# Patient Record
Sex: Female | Born: 1980 | Race: Black or African American | Hispanic: No | Marital: Single | Smoking: Former smoker
Health system: Southern US, Community
[De-identification: ages and names within clinical notes are randomized; demographics above are authoritative.]

## PROBLEM LIST (undated history)

## (undated) DIAGNOSIS — I214 Non-ST elevation (NSTEMI) myocardial infarction: Secondary | ICD-10-CM

## (undated) DIAGNOSIS — Z95820 Peripheral vascular angioplasty status with implants and grafts: Secondary | ICD-10-CM

## (undated) DIAGNOSIS — L03113 Cellulitis of right upper limb: Secondary | ICD-10-CM

## (undated) DIAGNOSIS — G4730 Sleep apnea, unspecified: Secondary | ICD-10-CM

## (undated) DIAGNOSIS — E785 Hyperlipidemia, unspecified: Secondary | ICD-10-CM

## (undated) DIAGNOSIS — N179 Acute kidney failure, unspecified: Secondary | ICD-10-CM

## (undated) DIAGNOSIS — M1990 Unspecified osteoarthritis, unspecified site: Secondary | ICD-10-CM

## (undated) DIAGNOSIS — I2510 Atherosclerotic heart disease of native coronary artery without angina pectoris: Secondary | ICD-10-CM

## (undated) DIAGNOSIS — F419 Anxiety disorder, unspecified: Secondary | ICD-10-CM

## (undated) DIAGNOSIS — I10 Essential (primary) hypertension: Secondary | ICD-10-CM

## (undated) DIAGNOSIS — F32A Depression, unspecified: Secondary | ICD-10-CM

## (undated) DIAGNOSIS — D649 Anemia, unspecified: Secondary | ICD-10-CM

## (undated) DIAGNOSIS — E669 Obesity, unspecified: Secondary | ICD-10-CM

## (undated) DIAGNOSIS — T8859XA Other complications of anesthesia, initial encounter: Secondary | ICD-10-CM

## (undated) HISTORY — DX: Atherosclerotic heart disease of native coronary artery without angina pectoris: I25.10

## (undated) HISTORY — DX: Hyperlipidemia, unspecified: E78.5

## (undated) HISTORY — PX: TONSILLECTOMY: SUR1361

## (undated) HISTORY — DX: Peripheral vascular angioplasty status with implants and grafts: Z95.820

## (undated) HISTORY — DX: Non-ST elevation (NSTEMI) myocardial infarction: I21.4

## (undated) HISTORY — PX: LEG SURGERY: SHX1003

---

## 2007-09-01 ENCOUNTER — Ambulatory Visit: Payer: Self-pay | Admitting: Family Medicine

## 2007-09-02 ENCOUNTER — Ambulatory Visit: Payer: Self-pay | Admitting: Obstetrics & Gynecology

## 2007-09-05 ENCOUNTER — Encounter: Admission: RE | Admit: 2007-09-05 | Discharge: 2007-09-05 | Payer: Self-pay | Admitting: Obstetrics & Gynecology

## 2007-09-05 ENCOUNTER — Ambulatory Visit: Payer: Self-pay | Admitting: *Deleted

## 2007-09-12 ENCOUNTER — Ambulatory Visit (HOSPITAL_COMMUNITY): Admission: RE | Admit: 2007-09-12 | Discharge: 2007-09-12 | Payer: Self-pay | Admitting: Family Medicine

## 2007-09-12 ENCOUNTER — Ambulatory Visit: Payer: Self-pay | Admitting: Obstetrics & Gynecology

## 2007-09-13 ENCOUNTER — Emergency Department (HOSPITAL_COMMUNITY): Admission: EM | Admit: 2007-09-13 | Discharge: 2007-09-14 | Payer: Self-pay | Admitting: Emergency Medicine

## 2007-09-15 ENCOUNTER — Ambulatory Visit (HOSPITAL_COMMUNITY): Admission: RE | Admit: 2007-09-15 | Discharge: 2007-09-15 | Payer: Self-pay | Admitting: Family Medicine

## 2007-09-19 ENCOUNTER — Ambulatory Visit: Payer: Self-pay | Admitting: Obstetrics & Gynecology

## 2007-09-26 ENCOUNTER — Ambulatory Visit: Payer: Self-pay | Admitting: Family Medicine

## 2007-09-26 ENCOUNTER — Ambulatory Visit (HOSPITAL_COMMUNITY): Admission: RE | Admit: 2007-09-26 | Discharge: 2007-09-26 | Payer: Self-pay | Admitting: Obstetrics & Gynecology

## 2007-10-10 ENCOUNTER — Ambulatory Visit: Payer: Self-pay | Admitting: Obstetrics & Gynecology

## 2007-10-12 ENCOUNTER — Encounter: Payer: Self-pay | Admitting: Obstetrics & Gynecology

## 2007-10-12 ENCOUNTER — Ambulatory Visit: Payer: Self-pay | Admitting: Obstetrics & Gynecology

## 2007-10-12 ENCOUNTER — Other Ambulatory Visit: Admission: RE | Admit: 2007-10-12 | Discharge: 2007-10-12 | Payer: Self-pay | Admitting: Obstetrics & Gynecology

## 2007-10-17 ENCOUNTER — Ambulatory Visit: Payer: Self-pay | Admitting: Family Medicine

## 2007-10-18 ENCOUNTER — Ambulatory Visit: Payer: Self-pay | Admitting: Obstetrics & Gynecology

## 2007-10-19 ENCOUNTER — Ambulatory Visit: Payer: Self-pay | Admitting: Obstetrics & Gynecology

## 2007-10-24 ENCOUNTER — Ambulatory Visit: Payer: Self-pay | Admitting: Obstetrics & Gynecology

## 2007-10-31 ENCOUNTER — Ambulatory Visit: Payer: Self-pay | Admitting: Obstetrics & Gynecology

## 2007-11-04 ENCOUNTER — Ambulatory Visit (HOSPITAL_COMMUNITY): Admission: RE | Admit: 2007-11-04 | Discharge: 2007-11-04 | Payer: Self-pay | Admitting: Obstetrics & Gynecology

## 2007-11-07 ENCOUNTER — Ambulatory Visit: Payer: Self-pay | Admitting: Obstetrics & Gynecology

## 2007-11-20 ENCOUNTER — Ambulatory Visit: Payer: Self-pay | Admitting: Obstetrics & Gynecology

## 2007-11-20 ENCOUNTER — Inpatient Hospital Stay (HOSPITAL_COMMUNITY): Admission: AD | Admit: 2007-11-20 | Discharge: 2007-11-20 | Payer: Self-pay | Admitting: Obstetrics & Gynecology

## 2007-11-21 ENCOUNTER — Ambulatory Visit: Payer: Self-pay | Admitting: Obstetrics & Gynecology

## 2007-12-02 ENCOUNTER — Ambulatory Visit (HOSPITAL_COMMUNITY): Admission: RE | Admit: 2007-12-02 | Discharge: 2007-12-02 | Payer: Self-pay | Admitting: Physician Assistant

## 2007-12-05 ENCOUNTER — Ambulatory Visit: Payer: Self-pay | Admitting: Obstetrics & Gynecology

## 2007-12-09 ENCOUNTER — Ambulatory Visit: Payer: Self-pay | Admitting: Obstetrics & Gynecology

## 2007-12-09 ENCOUNTER — Other Ambulatory Visit: Admission: RE | Admit: 2007-12-09 | Discharge: 2007-12-09 | Payer: Self-pay | Admitting: Obstetrics & Gynecology

## 2007-12-12 ENCOUNTER — Ambulatory Visit: Payer: Self-pay | Admitting: Obstetrics & Gynecology

## 2007-12-14 ENCOUNTER — Ambulatory Visit: Payer: Self-pay | Admitting: Gynecology

## 2007-12-14 ENCOUNTER — Ambulatory Visit (HOSPITAL_COMMUNITY): Admission: RE | Admit: 2007-12-14 | Discharge: 2007-12-14 | Payer: Self-pay | Admitting: Obstetrics & Gynecology

## 2007-12-26 ENCOUNTER — Ambulatory Visit (HOSPITAL_COMMUNITY): Admission: RE | Admit: 2007-12-26 | Discharge: 2007-12-26 | Payer: Self-pay | Admitting: Obstetrics & Gynecology

## 2007-12-26 ENCOUNTER — Ambulatory Visit: Payer: Self-pay | Admitting: Obstetrics & Gynecology

## 2007-12-29 ENCOUNTER — Ambulatory Visit: Payer: Self-pay | Admitting: Obstetrics & Gynecology

## 2007-12-30 ENCOUNTER — Ambulatory Visit (HOSPITAL_COMMUNITY): Admission: RE | Admit: 2007-12-30 | Discharge: 2007-12-30 | Payer: Self-pay | Admitting: Physician Assistant

## 2007-12-30 ENCOUNTER — Ambulatory Visit: Payer: Self-pay | Admitting: Gynecology

## 2008-01-05 ENCOUNTER — Ambulatory Visit: Payer: Self-pay | Admitting: Obstetrics & Gynecology

## 2008-01-05 ENCOUNTER — Ambulatory Visit (HOSPITAL_COMMUNITY): Admission: RE | Admit: 2008-01-05 | Discharge: 2008-01-05 | Payer: Self-pay | Admitting: Physician Assistant

## 2008-01-12 ENCOUNTER — Ambulatory Visit (HOSPITAL_COMMUNITY): Admission: RE | Admit: 2008-01-12 | Discharge: 2008-01-12 | Payer: Self-pay | Admitting: Family Medicine

## 2008-01-19 ENCOUNTER — Ambulatory Visit (HOSPITAL_COMMUNITY): Admission: RE | Admit: 2008-01-19 | Discharge: 2008-01-19 | Payer: Self-pay | Admitting: Physician Assistant

## 2008-01-19 ENCOUNTER — Ambulatory Visit: Payer: Self-pay | Admitting: Obstetrics & Gynecology

## 2008-01-20 ENCOUNTER — Ambulatory Visit: Payer: Self-pay | Admitting: Gynecology

## 2008-01-23 ENCOUNTER — Ambulatory Visit: Payer: Self-pay | Admitting: Obstetrics & Gynecology

## 2008-01-24 ENCOUNTER — Ambulatory Visit: Payer: Self-pay | Admitting: Obstetrics and Gynecology

## 2008-01-26 ENCOUNTER — Ambulatory Visit: Payer: Self-pay | Admitting: Obstetrics & Gynecology

## 2008-01-26 ENCOUNTER — Ambulatory Visit (HOSPITAL_COMMUNITY): Admission: RE | Admit: 2008-01-26 | Discharge: 2008-01-26 | Payer: Self-pay | Admitting: Physician Assistant

## 2008-01-30 ENCOUNTER — Ambulatory Visit: Payer: Self-pay | Admitting: Obstetrics & Gynecology

## 2008-02-02 ENCOUNTER — Ambulatory Visit (HOSPITAL_COMMUNITY): Admission: RE | Admit: 2008-02-02 | Discharge: 2008-02-02 | Payer: Self-pay | Admitting: Obstetrics & Gynecology

## 2008-02-06 ENCOUNTER — Ambulatory Visit: Payer: Self-pay | Admitting: Obstetrics & Gynecology

## 2008-02-07 ENCOUNTER — Ambulatory Visit: Payer: Self-pay | Admitting: Obstetrics & Gynecology

## 2008-02-09 ENCOUNTER — Ambulatory Visit: Payer: Self-pay | Admitting: Obstetrics and Gynecology

## 2008-02-09 ENCOUNTER — Inpatient Hospital Stay (HOSPITAL_COMMUNITY): Admission: AD | Admit: 2008-02-09 | Discharge: 2008-02-15 | Payer: Self-pay | Admitting: Obstetrics & Gynecology

## 2008-02-09 ENCOUNTER — Encounter: Payer: Self-pay | Admitting: *Deleted

## 2008-02-21 ENCOUNTER — Ambulatory Visit: Payer: Self-pay | Admitting: *Deleted

## 2008-02-21 ENCOUNTER — Inpatient Hospital Stay (HOSPITAL_COMMUNITY): Admission: AD | Admit: 2008-02-21 | Discharge: 2008-02-21 | Payer: Self-pay | Admitting: Obstetrics & Gynecology

## 2008-02-24 ENCOUNTER — Ambulatory Visit: Payer: Self-pay | Admitting: Gynecology

## 2010-03-05 ENCOUNTER — Emergency Department (HOSPITAL_COMMUNITY): Admission: EM | Admit: 2010-03-05 | Discharge: 2010-03-05 | Payer: Self-pay | Admitting: Emergency Medicine

## 2010-03-21 ENCOUNTER — Emergency Department (HOSPITAL_COMMUNITY): Admission: EM | Admit: 2010-03-21 | Discharge: 2010-03-21 | Payer: Self-pay | Admitting: Family Medicine

## 2010-05-23 ENCOUNTER — Encounter: Admission: RE | Admit: 2010-05-23 | Discharge: 2010-05-23 | Payer: Self-pay | Admitting: Family Medicine

## 2010-12-25 ENCOUNTER — Emergency Department (HOSPITAL_COMMUNITY)
Admission: EM | Admit: 2010-12-25 | Discharge: 2010-12-25 | Payer: Self-pay | Source: Home / Self Care | Admitting: Emergency Medicine

## 2011-01-11 ENCOUNTER — Encounter: Payer: Self-pay | Admitting: *Deleted

## 2011-03-11 LAB — WET PREP, GENITAL: Clue Cells Wet Prep HPF POC: NONE SEEN

## 2011-03-11 LAB — POCT URINALYSIS DIP (DEVICE)
Bilirubin Urine: NEGATIVE
Glucose, UA: >=1000 mg/dL — AB
Nitrite: NEGATIVE
pH: 6 (ref 5.0–8.0)

## 2011-03-11 LAB — POCT PREGNANCY, URINE: Preg Test, Ur: NEGATIVE

## 2011-03-11 LAB — GC/CHLAMYDIA PROBE AMP, GENITAL: GC Probe Amp, Genital: NEGATIVE

## 2011-03-11 LAB — URINE CULTURE

## 2011-03-16 LAB — GLUCOSE, CAPILLARY

## 2011-05-05 NOTE — Discharge Summary (Signed)
NAMEJENNALEE, GREAVES               ACCOUNT NO.:  [id number]   MEDICAL RECORD NO.:  [id number]            PATIENT TYPE:   LOCATION:                                 FACILITY:   PHYSICIAN:  Tanya S. Shawnie Pons, M.D.   DATE OF BIRTH:  [birth year]   DATE OF ADMISSION:  DATE OF DISCHARGE:                               DISCHARGE SUMMARY   ADDENDUM:  Ms. Muilenburg stayed one extra day secondary to her infant  having jaundice.  She was stable throughout her stay.  Her postprandial  blood sugars ranged from 109 to 140, fasting was 88.  She was stable.  Incision looked good.  JP drain had 40 cc out over the last 24 hours of  her hosptial stay.  Discharge insulin was switched to 70/30, 55 units in  the morning and 35 units in the evening.  Patient has arrangements to be  discharged to [facility] in [place].  She is to follow up  in MAU in 6 days for staple removal and drain removal.  She is to follow  sooner if she has any concerns about her incision.      Karlton Lemon, MD  Electronically Signed     ______________________________  Shelbie Proctor. Shawnie Pons, M.D.    NS/MEDQ  D:  02/15/2008  T:  02/15/2008  Job:  [id number]

## 2011-05-05 NOTE — Discharge Summary (Signed)
Lindsay [name], Lindsay [name]             ACCOUNT NO.:  [id number]   MEDICAL RECORD NO.:  [id number]          PATIENT TYPE:  INP   LOCATION:                                 FACILITY:   PHYSICIAN:  Tanya S. Shawnie Pons, M.D.   DATE OF BIRTH:  [birth year]   DATE OF ADMISSION:  02/09/2008  DATE OF DISCHARGE:  02/14/2008                               DISCHARGE SUMMARY   DISCHARGE DIAGNOSES:  1. Primary low-tranverse C-section for non reassuring fetal heart      tones and failure to progress with delivery of viable baby girl.  2. Chronic hypertension with superimposed preeclampsia.  3. Type 2 diabetes, insulin dependent during pregnancy.  4. Asthma.  5. History of herpes simplex virus.   DISCHARGE MEDICATIONS:  1. Percocet 5/325 mg 1-2 p.o. q.6h p.r.n. pain.  2. Motrin 600 mg 1 p.o. q.6h. p.r.n. abdominal cramping.  3. Aspirin 81 mg daily.  4. Colace 100 mg 1 p.o. b.i.d.  5. Prenatal vitamins 1 p.o. daily while breastfeeding.  6. Albuterol 90 mcg FDI 2 puffs q.4-6h. p.r.n. wheezing.  7. NPH 40 units in morning, 20 units at night.  8. Regular insulin 20 units b.i.d.   DISCHARGE LABS:  O+, antibody negative.  Hemoglobin 9.5, hematocrit  27.9, platelets 182.  Rubella immune.  HB surface antigen negative.  RPR  nonreactive.  HIV nonreactive.  Chlamydia and Gonorrhea negative.  GBS  positive.   HOSPITAL COURSE:  This is a 30 year old female G1 P1 who presented at 38  weeks 6 days for induction of labor secondary to chronic secondary with  superimposed preeclampsia and poorly controlled Class B diabetes.  Induction was converted to primary low-tranverse C-section secondary to  non reassuring fetal heart tones and failure to progress.  C-section  performed by Dr. Emelda Fear and Dr. Darrol Angel under epidural anesthesia with  delivery of 7 pounds 15 ounce female with Apgars of 5 and 9 at 1 minute  and 5 minutes, respectively, and blood gas pH of 7.2.  There was 1  nuchal cord which was reduced, and no  complications were noted.  Patient  desires to breastfeed and denies any contraception currently.  Patient  to be discharged to [facility] in [place], where she is  currently residing.   DISCHARGE INSTRUCTIONS:  Upon discharge, patient is tolerating  ambulation without dizziness, tolerating p.o., voiding and having bowel  movements.  Patient discharged in stable condition.  JP drain and  staples will remain in until visit in 1 week to MAU where they will be  removed.  Patient was taught prior to discharge on JP drain maintenance  and emptying.   FOLLOW UP:  Patient to follow up with [facility] upon  discharge and then call for appointment.  The patient also to return to  MAU for JP drain removal and staple removal in a week.      Eustaquio Boyden, MD      Shelbie Proctor. Shawnie Pons, M.D.  Electronically Signed    JG/MEDQ  D:  02/14/2008  T:  02/14/2008  Job:  [id number]

## 2011-05-05 NOTE — Op Note (Signed)
Lindsay [name], Lindsay [name]             ACCOUNT NO.:  [id number]   MEDICAL RECORD NO.:  [id number]          PATIENT TYPE:  INP   LOCATION:  9373                          FACILITY:  [facility]   PHYSICIAN:  Tilda Burrow, M.D. DATE OF BIRTH:  01-Sep-1981   DATE OF PROCEDURE:  02/11/2008  DATE OF DISCHARGE:                               OPERATIVE REPORT   PREOPERATIVE DIAGNOSES:  1. Pregnancy 39 weeks.  2. Failure to progress.  3. Nonreassuring fetal heart tones.  4. Chronic hypertension with superimposed preeclampsia.  5. Gestational diabetes, insulin-controlled.   POSTOPERATIVE DIAGNOSES:  1. Pregnancy 39 weeks.  2. Failure to progress.  3. Nonreassuring fetal heart tones.  4. Chronic hypertension with superimposed preeclampsia.  5. Gestational diabetes, insulin-controlled.   PROCEDURE:  Primary low transverse cervical cesarean section.   SURGEON:  Christin Bach, MD.   ASSISTANT:  Karlton Lemon, MD.   ANESTHESIA:  Epidural.   COMPLICATIONS:  None.   FINDINGS:  A 7 pound 15 ounce female, Apgars 5/9, blood gas pH 7.2,  normal female anatomy with a nuchal cord x1.   INDICATIONS:  Induction at 39 weeks for gestational diabetes, chronic  hypertension with preeclampsia based on protein criteria.   DETAILS OF PROCEDURE:  The patient was taken to the operating room,  prepped and draped for lower abdominal surgery, the epidural catheter  had been functioning excellently and was topped up, fetal heart rates  were confirmed and the scalp electrode removed.  A time-out performed  and abdomen had been taped upward to allow access.  A transverse lower  abdominal incision was made in the method of Pfannenstiel for  approximately 25 cm in length with sharp dissection through the  subcutaneous fatty tissue, identifying the fascia with a transverse  incision in the fascia.  The rectus muscles were split in the midline,  the peritoneal cavity entered bluntly and bladder flap developed  minimally  on the lower uterine segment.  A transverse uterine incision  was made and extended transversely with index finger traction and then  cephalad somewhat.  Bag of waters was intact.  Allis clamp was used to  grasp the bag of waters to rupture the membranes.  It was noted that  there was a loop of cord right where the Allis clamp had been placed.  The clamp was repositioned, membranes ruptured and fetal vertex rotated  into the incision and delivered with fundal pressure.  The cord was  loose and allowed the baby to be slipped through the loop of cord as the  baby was delivered.  Cord was clamped, cord blood gases obtained, pH 7.2  on arterial sample.  The cord blood sample was taken, placenta delivered  by Crede uterine massage.  IV antibiotics, 2 g Ancef, was administered.  The patient had relatively good uterine tone, responded to IV oxytocin.  The uterus was closed in a single layer running locking closure but  required several figure-of-eight sutures in the second layer to achieve  adequate hemostasis.  Bladder flap was reapproximated in the middle  portion of the incision.  Hemostasis was considered adequate.  The  abdomen was inspected, no clots visualized.  The peritoneum closed  anteriorly, the rectus muscles pulled together with a single suture of  #2-0 Chromic at the level of the pyramidalis muscle insertion, and then  the fascia closed using two segments of continuous running #0 Vicryl  sewn from side to middle on each side.  The subcu fatty tissues were  inspected, confirmed as hemostatic.  A flat JP drain was placed through  the depths of the subcutaneous fatty tissue and allowed to exit through  a separate stab incision on the left side and then the subcutaneous  fatty tissue was reapproximated using interrupted #2-0 plain times  several sites.  Staple closure of the skin completed the procedure.  Sponge and needle counts correct.  Patient went to recovery room in  stable  condition.      Tilda Burrow, M.D.  Electronically Signed     JVF/MEDQ  D:  02/11/2008  T:  02/12/2008  Job:  [id number]

## 2011-06-10 ENCOUNTER — Inpatient Hospital Stay (HOSPITAL_COMMUNITY)
Admission: EM | Admit: 2011-06-10 | Discharge: 2011-06-13 | DRG: 442 | Disposition: A | Discharge status: Home / Self Care | Payer: Medicaid Other | Attending: Internal Medicine | Admitting: Internal Medicine

## 2011-06-10 ENCOUNTER — Emergency Department (HOSPITAL_COMMUNITY): Payer: Medicaid Other

## 2011-06-10 DIAGNOSIS — Z794 Long term (current) use of insulin: Secondary | ICD-10-CM

## 2011-06-10 DIAGNOSIS — I951 Orthostatic hypotension: Secondary | ICD-10-CM | POA: Diagnosis present

## 2011-06-10 DIAGNOSIS — J45909 Unspecified asthma, uncomplicated: Secondary | ICD-10-CM | POA: Diagnosis present

## 2011-06-10 DIAGNOSIS — Z9119 Patient's noncompliance with other medical treatment and regimen: Secondary | ICD-10-CM

## 2011-06-10 DIAGNOSIS — E871 Hypo-osmolality and hyponatremia: Secondary | ICD-10-CM | POA: Diagnosis present

## 2011-06-10 DIAGNOSIS — Z91199 Patient's noncompliance with other medical treatment and regimen due to unspecified reason: Secondary | ICD-10-CM

## 2011-06-10 DIAGNOSIS — E669 Obesity, unspecified: Secondary | ICD-10-CM | POA: Diagnosis present

## 2011-06-10 DIAGNOSIS — E119 Type 2 diabetes mellitus without complications: Secondary | ICD-10-CM | POA: Diagnosis present

## 2011-06-10 DIAGNOSIS — B1910 Unspecified viral hepatitis B without hepatic coma: Principal | ICD-10-CM | POA: Diagnosis present

## 2011-06-10 DIAGNOSIS — I10 Essential (primary) hypertension: Secondary | ICD-10-CM | POA: Diagnosis present

## 2011-06-10 LAB — CBC
Hemoglobin: 12.1 g/dL (ref 12.0–15.0)
MCH: 26.1 pg (ref 26.0–34.0)
MCV: 79.3 fL (ref 78.0–100.0)
RBC: 4.63 MIL/uL (ref 3.87–5.11)

## 2011-06-10 LAB — COMPREHENSIVE METABOLIC PANEL
CO2: 21 mEq/L (ref 19–32)
Calcium: 9.6 mg/dL (ref 8.4–10.5)
Creatinine, Ser: <0.47 mg/dL — ABNORMAL LOW (ref 0.50–1.10)
Glucose, Bld: 449 mg/dL — ABNORMAL HIGH (ref 70–99)

## 2011-06-10 LAB — URINALYSIS, ROUTINE W REFLEX MICROSCOPIC
Nitrite: NEGATIVE
Specific Gravity, Urine: 1.034 — ABNORMAL HIGH (ref 1.005–1.030)
Urobilinogen, UA: 1 mg/dL (ref 0.0–1.0)

## 2011-06-10 LAB — URINE MICROSCOPIC-ADD ON

## 2011-06-10 LAB — DIFFERENTIAL
Eosinophils Absolute: 0.1 10*3/uL (ref 0.0–0.7)
Lymphs Abs: 2.4 10*3/uL (ref 0.7–4.0)
Monocytes Relative: 4 % (ref 3–12)
Neutro Abs: 2.6 10*3/uL (ref 1.7–7.7)
Neutrophils Relative %: 48 % (ref 43–77)

## 2011-06-11 ENCOUNTER — Emergency Department (HOSPITAL_COMMUNITY): Payer: Medicaid Other

## 2011-06-11 LAB — URINE DRUGS OF ABUSE SCREEN W ALC, ROUTINE (REF LAB)
Benzodiazepines.: NEGATIVE
Cocaine Metabolites: NEGATIVE
Methadone: NEGATIVE
Phencyclidine (PCP): NEGATIVE
Propoxyphene: NEGATIVE

## 2011-06-11 LAB — GLUCOSE, CAPILLARY
Glucose-Capillary: 323 mg/dL — ABNORMAL HIGH (ref 70–99)
Glucose-Capillary: 337 mg/dL — ABNORMAL HIGH (ref 70–99)
Glucose-Capillary: 313 mg/dL — ABNORMAL HIGH (ref 70–99)
Glucose-Capillary: 247 mg/dL — ABNORMAL HIGH (ref 70–99)
Glucose-Capillary: 133 mg/dL — ABNORMAL HIGH (ref 70–99)
Glucose-Capillary: 185 mg/dL — ABNORMAL HIGH (ref 70–99)
Glucose-Capillary: 206 mg/dL — ABNORMAL HIGH (ref 70–99)

## 2011-06-11 LAB — HEPATIC FUNCTION PANEL
ALT: 1491 U/L — ABNORMAL HIGH (ref 0–35)
AST: 1026 U/L — ABNORMAL HIGH (ref 0–37)
Alkaline Phosphatase: 343 U/L — ABNORMAL HIGH (ref 39–117)
Bilirubin, Direct: 4.4 mg/dL — ABNORMAL HIGH (ref 0.0–0.3)
Indirect Bilirubin: 2.1 mg/dL — ABNORMAL HIGH (ref 0.3–0.9)
Total Bilirubin: 6.5 mg/dL — ABNORMAL HIGH (ref 0.3–1.2)

## 2011-06-11 LAB — TSH: TSH: 1.568 u[IU]/mL (ref 0.350–4.500)

## 2011-06-11 LAB — PROTIME-INR: INR: 1.03 (ref 0.00–1.49)

## 2011-06-11 LAB — CK: Total CK: 26 U/L (ref 7–177)

## 2011-06-11 MED ORDER — IOHEXOL 300 MG/ML  SOLN
100.0000 mL | Freq: Once | INTRAMUSCULAR | Status: AC | PRN
  Administered 2011-06-11: 100 mL via INTRAVENOUS

## 2011-06-11 NOTE — H&P (Signed)
NAMEHALAH, Lindsay [name]             ACCOUNT NO.:  [id number]  MEDICAL RECORD NO.:  [id number]  LOCATION:  5151                         FACILITY:  [facility]  PHYSICIAN:  Marinda Elk, M.D.DATE OF BIRTH:  02-02-1981  DATE OF ADMISSION:  06/10/2011 DATE OF DISCHARGE:                             HISTORY & PHYSICAL   PRIMARY CARE DOCTOR:  [facility].  CHIEF COMPLAINT:  Nausea and vomiting, orthostasis, and dizzy upon standing.  HISTORY OF PRESENT ILLNESS:  This is a 30 year old with past medical history of diabetes who comes in for polyuria, nausea and vomiting with orthostatic hypotension.  She relates this started about 4 days prior to admission, the nausea and vomiting.  Eating with no appetite and drinking minimally.  She relates that 2 days prior to admission, she started having signs of dizzy upon standing, which progressively got worse.  One day prior to admission, she started having mild sweating and a very dark urine with no particular cause.  She also relates having unprotected sex.  No traveling history.  No sick contacts.  Only taken Tylenol about a week ago.  She took about 4 g in a day for headache, but that was once.  She relates no alcohol abuse.  She decided to come to the hospital as her symptoms continue to get worse.  She relates no chest pain, shortness of breath, or diarrhea.  No travel history.  ALLERGIES:  No known drug allergies.  PAST MEDICAL HISTORY: 1. Diabetes. 2. Hypertension. 3. Asthma.  MEDICATIONS:  Metformin.  She does not know the dose.  SOCIAL HISTORY:  She denies tobacco, alcohol, or drugs.  She lives in [place] by herself.  FAMILY HISTORY:  She has 1 kid which is healthy.  She is 68 years old and her father has diabetes and her mother died at the age of 83, she does not know.  REVIEW OF SYSTEMS:  A 10-point review of system done, pertinent positive per HPI.  PHYSICAL EXAMINATION:  VITAL SIGNS:  Temperature 97, pulse of  88, respirations 18, and blood pressure 148/94.  She was sating 99% on room air. GENERAL:  She is obese female lying comfortably in bed, in no acute distress. HEENT:  Dry mucous membrane.  No JVD.  No bruits.  No thyromegaly. Atraumatic and normocephalic.  No jaundice. CARDIOVASCULAR:  She has a regular rate and rhythm with a positive S1 and S2.  No murmurs, rubs, or gallops. LUNGS:  Good air movement.  Clear to auscultation. ABDOMEN:  Positive bowel sounds.  Nontender, nondistended, and soft. Negative CVA.  No flank pain. EXTREMITIES:  Positive pulses.  No clubbing.  Cyanosis and trace edema. SKIN:  No ulcerations or rashes. NEUROLOGIC:  Nonfocal.  LABORATORY DATA:  On admission shows a lipase of 76.  Her UA showed 2 white blood cells and red blood cells, her specific gravity was 1.044. Her urine glucose is greater than 1000, bilirubin is small, ketones of 40.  Her sodium is 127, her potassium 4.2, chloride 97, bicarb of 21, glucose of 447, BUN of 6, and creatinine 0.47.  Bilirubin of 6.6, alkaline phosphatase 399, AST 1145, ALT 1776, total protein 7.2, albumin of 2.8, her calcium is 9.6.  Her white count is 5.4, hemoglobin of 12.1, platelet count 294 with an ANC of 2.6.  IMAGING:  Shows a CT scan of the abdomen and pelvis, no acute findings with no splenomegaly. Ultrasound shows no stone, normal-appearing gallbladder.  Splenomegaly otherwise negative.  ASSESSMENT AND PLAN: 1. Hyperglycemia.  We will start her on some of the glucommander.  We     will stop her metformin.  She has now been treating it for 30 days.     Once her glucoses look good control, we will switch her to Lantus     and sliding scale. 2. Orthostatic hypotension, probably from decreased p.o. intake and     hyperglycemia.  We will give her IV fluids and recheck in the     morning. 3. 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  She     relates no oral contraceptive pills.  No medications  except for     Tylenol, which she took last week. With ultrasound that showed     splenomegaly with no stones and CT scan unremarkable except for     splenomegaly.  Concern for hepatitis.  I will also check an HIV,     UDS, and TSH.  We will put her n.p.o.  She relates no alcohol     abuse.  I doubt this is NASH.  We will also check a CK for probable     muscle injury although I doubt it.  She has had no episode of     hypotension, which could explain ischemic injury.  At this time, I     am more concerned about hepatitis.  At this time, we will put her     n.p.o. and we will check lab as above. 4. Pseudohyponatremia secondary to high blood glucose.  Also     contributing to this hyponatremia is probably dehydration.  We will     check a urinary sodium, urinary creatinine.  We will give IV fluids     and we will recheck in the morning.     Marinda Elk, M.D.     AF/MEDQ  D:  06/11/2011  T:  06/11/2011  Job:  [id number]  cc:   Clinic [facility]  Electronically Signed by Marinda Elk M.D. on 06/11/2011 06:27:59 PM

## 2011-06-12 LAB — DIFFERENTIAL
Basophils Absolute: 0 10*3/uL (ref 0.0–0.1)
Eosinophils Relative: 2 % (ref 0–5)
Monocytes Absolute: 0.8 10*3/uL (ref 0.1–1.0)
Monocytes Relative: 15 % — ABNORMAL HIGH (ref 3–12)
Neutrophils Relative %: 40 % — ABNORMAL LOW (ref 43–77)

## 2011-06-12 LAB — CBC
HCT: 33.2 % — ABNORMAL LOW (ref 36.0–46.0)
Hemoglobin: 10.7 g/dL — ABNORMAL LOW (ref 12.0–15.0)
MCHC: 32.2 g/dL (ref 30.0–36.0)
RDW: 18.1 % — ABNORMAL HIGH (ref 11.5–15.5)
WBC: 5.5 10*3/uL (ref 4.0–10.5)

## 2011-06-12 LAB — COMPREHENSIVE METABOLIC PANEL
ALT: 1411 U/L — ABNORMAL HIGH (ref 0–35)
Alkaline Phosphatase: 306 U/L — ABNORMAL HIGH (ref 39–117)
CO2: 22 mEq/L (ref 19–32)
Chloride: 101 mEq/L (ref 96–112)
Glucose, Bld: 211 mg/dL — ABNORMAL HIGH (ref 70–99)
Potassium: 4.2 mEq/L (ref 3.5–5.1)
Sodium: 131 mEq/L — ABNORMAL LOW (ref 135–145)
Total Bilirubin: 5.9 mg/dL — ABNORMAL HIGH (ref 0.3–1.2)
Total Protein: 6.1 g/dL (ref 6.0–8.3)

## 2011-06-12 LAB — LIPID PANEL: Total CHOL/HDL Ratio: 53.7 RATIO

## 2011-06-12 LAB — GLUCOSE, CAPILLARY
Glucose-Capillary: 134 mg/dL — ABNORMAL HIGH (ref 70–99)
Glucose-Capillary: 194 mg/dL — ABNORMAL HIGH (ref 70–99)
Glucose-Capillary: 207 mg/dL — ABNORMAL HIGH (ref 70–99)
Glucose-Capillary: 291 mg/dL — ABNORMAL HIGH (ref 70–99)
Glucose-Capillary: 285 mg/dL — ABNORMAL HIGH (ref 70–99)
Glucose-Capillary: 335 mg/dL — ABNORMAL HIGH (ref 70–99)

## 2011-06-12 LAB — HEPATITIS PANEL, ACUTE
HCV Ab: NEGATIVE
Hepatitis B Surface Ag: POSITIVE — AB

## 2011-06-12 LAB — HIV-1 RNA ULTRAQUANT REFLEX TO GENTYP+: HIV-1 RNA Quant, Log: <1.3 {Log} (ref <1.30)

## 2011-06-13 LAB — HEPATITIS B CORE ANTIBODY, TOTAL: Hep B Core Total Ab: POSITIVE — AB

## 2011-06-13 LAB — HEPATITIS B SURFACE ANTIBODY,QUALITATIVE: Hep B S Ab: NEGATIVE

## 2011-06-13 LAB — HEPATITIS B SURFACE ANTIGEN: Hepatitis B Surface Ag: POSITIVE — AB

## 2011-06-13 LAB — GLUCOSE, CAPILLARY
Glucose-Capillary: 335 mg/dL — ABNORMAL HIGH (ref 70–99)
Glucose-Capillary: 362 mg/dL — ABNORMAL HIGH (ref 70–99)

## 2011-06-15 LAB — HEPATITIS B DNA, ULTRAQUANTITATIVE, PCR
Hepatitis B DNA (Calc): 235128000 copies/mL — ABNORMAL HIGH (ref <116)
Hepatitis B DNA: 40400000 IU/mL — ABNORMAL HIGH (ref <20)

## 2011-06-15 NOTE — Discharge Summary (Signed)
  Lindsay [name]             ACCOUNT NO.:  [id number]  MEDICAL RECORD NO.:  [id number]  LOCATION:  5151                         FACILITY:  [facility]  PHYSICIAN:  Marinda Elk, M.D.DATE OF BIRTH:  1981-02-10  DATE OF ADMISSION:  06/10/2011 DATE OF DISCHARGE:  06/13/2011                              DISCHARGE SUMMARY   PRIMARY CARE DOCTOR:  [facility].  CONSULTANTS:  [facility].  DISCHARGE DIAGNOSES: 1. Acute Hepatitis B. 2. Orthostatic hypotension. 3. Hyponatremia.  DISCHARGE MEDICATIONS: 1. NovoLog sliding scale. 2. Lantus 55 units subcutaneous at bedtime. 3. Promethazine 12.5 mg q.6 h. p.r.n. 4. Aspirin 325 mg daily. 5. Tylenol 1-2 tablets q.4 h. as needed.  PROCEDURES PERFORMED:  CT of the abdomen and pelvis showed no acute findings, mild splenomegaly.  Abdominal ultrasound shows splenomegaly.  BRIEF ADMITTING HISTORY AND PHYSICAL:  This is a 30 year old female with past medical history of diabetes, who comes in for polyuria, nausea, and vomiting, as well as orthostatic hypotension, relates 4 days prior to admission this has been started.  She has not been eating, drinking minimally, dizzy upon standing, and progressively getting worse.  Please refer to dictation from June 10, 2010, for further details.  LABORATORY DATA:  Labs on admission shows lipase of 76.  UA, 0-2 white blood cells and red cells, specific gravity 1.044, urine glucose more than 1000, bilirubin small, ketones of 40.  Sodium 127, potassium 4.2, chloride 97, bicarb of 21, glucose of 447, BUN of 6, creatinine 0.4, bilirubin 6.6, alkaline phosphatase 399, AST 1145, ALT 1776, total protein 7.2, albumin of 2.9, and calcium 9.6.  Her white count is 5.4, hemoglobin of 12, platelet count 294, and MCV of 2.6.  BRIEF HOSPITAL COURSE: 1. Hyperglycemia most likely secondary to noncompliance.  She was     started on the Glucommander, she did fine.  Her metformin was     stopped due to her liver  function.  She was started on Lantus and     sliding scale, and she will go home on Lantus sliding and she will     follow up with [facility] as an outpatient. 2. Hepatitis B.  Ultrasound and CT scan of the abdomen was done, which     was negative.  Acute hepatitis panel was done that was positive for     hepatitis B surface antigen and core antibody and positive which is     probably chronic.  Her HBV DNA is currently pending.  At this time,     a GI was consulted.  They recommended to     follow up with them as an outpatient if her symptoms has resolved.     We have scheduled a followup appointment with [facility].  She will     follow up as an outpatient. 3. Orthostatic hypotension that is probably secondary to     hyperglycemia.  This resolved with IV fluids.     Marinda Elk, M.D.     AF/MEDQ  D:  06/13/2011  T:  06/13/2011  Job:  [id number]  cc:   [facility]  Electronically Signed by Marinda Elk M.D. on 06/15/2011 04:21:38 PM

## 2011-07-11 ENCOUNTER — Inpatient Hospital Stay (INDEPENDENT_AMBULATORY_CARE_PROVIDER_SITE_OTHER)
Admission: RE | Admit: 2011-07-11 | Discharge: 2011-07-11 | Disposition: A | Discharge status: Home / Self Care | Payer: Medicaid Other | Source: Ambulatory Visit | Attending: Emergency Medicine | Admitting: Emergency Medicine

## 2011-07-11 DIAGNOSIS — E119 Type 2 diabetes mellitus without complications: Secondary | ICD-10-CM

## 2011-07-11 DIAGNOSIS — I10 Essential (primary) hypertension: Secondary | ICD-10-CM

## 2011-07-11 DIAGNOSIS — B1910 Unspecified viral hepatitis B without hepatic coma: Secondary | ICD-10-CM

## 2011-07-11 LAB — POCT I-STAT, CHEM 8
Calcium, Ion: 1.2 mmol/L (ref 1.12–1.32)
HCT: 42 % (ref 36.0–46.0)
Hemoglobin: 14.3 g/dL (ref 12.0–15.0)
TCO2: 24 mmol/L (ref 0–100)

## 2011-09-10 LAB — POCT URINALYSIS DIP (DEVICE)
Operator id: 148111
Operator id: 120861
Operator id: 297281
Protein, ur: 100 — AB
Protein, ur: 100 — AB
Protein, ur: 100 — AB
Specific Gravity, Urine: 1.025
Urobilinogen, UA: 0.2
Urobilinogen, UA: 0.2
Urobilinogen, UA: 1
pH: 6

## 2011-09-11 LAB — POCT URINALYSIS DIP (DEVICE)
Hgb urine dipstick: NEGATIVE
Operator id: 297281
Operator id: 297281
Operator id: 120861
Protein, ur: 100 — AB
Protein, ur: 100 — AB
Protein, ur: 100 — AB
Specific Gravity, Urine: 1.025
Specific Gravity, Urine: 1.025
Specific Gravity, Urine: 1.02
Urobilinogen, UA: 0.2
Urobilinogen, UA: 0.2
Urobilinogen, UA: 0.2
pH: 6
pH: 6
pH: 6.5

## 2011-09-11 LAB — ABO/RH: ABO/RH(D): O POS

## 2011-09-11 LAB — COMPREHENSIVE METABOLIC PANEL
AST: 19
Albumin: 2.3 — ABNORMAL LOW
BUN: 7
Calcium: 8.7
Creatinine, Ser: 0.74
GFR calc Af Amer: >60

## 2011-09-11 LAB — CBC
HCT: 27.9 — ABNORMAL LOW
HCT: 30.5 — ABNORMAL LOW
HCT: 31.6 — ABNORMAL LOW
Hemoglobin: 10.4 — ABNORMAL LOW
Hemoglobin: 9.5 — ABNORMAL LOW
MCHC: 34
MCHC: 34.3
MCV: 78.2
MCV: 79
Platelets: 227
RBC: 3.91
RDW: 15.8 — ABNORMAL HIGH
RDW: 16.3 — ABNORMAL HIGH
RDW: 16.7 — ABNORMAL HIGH
WBC: 6.6

## 2011-09-11 LAB — CROSSMATCH: ABO/RH(D): O POS

## 2011-09-11 LAB — RPR: RPR Ser Ql: NONREACTIVE

## 2011-09-11 LAB — URIC ACID: Uric Acid, Serum: 6.5

## 2011-09-17 ENCOUNTER — Other Ambulatory Visit: Payer: Self-pay | Admitting: Family Medicine

## 2011-09-25 LAB — POCT URINALYSIS DIP (DEVICE)
Hgb urine dipstick: NEGATIVE
Hgb urine dipstick: NEGATIVE
Protein, ur: NEGATIVE
Protein, ur: NEGATIVE
Specific Gravity, Urine: 1.015
Specific Gravity, Urine: 1.02
Urobilinogen, UA: 0.2
Urobilinogen, UA: 1

## 2011-09-28 LAB — POCT URINALYSIS DIP (DEVICE)
Glucose, UA: 100 — AB
Ketones, ur: 15 — AB
Protein, ur: 100 — AB
Specific Gravity, Urine: >=1.03
Urobilinogen, UA: 2 — ABNORMAL HIGH

## 2011-09-29 LAB — URINALYSIS, ROUTINE W REFLEX MICROSCOPIC
Glucose, UA: NEGATIVE
Ketones, ur: 15 — AB
Leukocytes, UA: NEGATIVE
Leukocytes, UA: NEGATIVE
Nitrite: NEGATIVE
Protein, ur: NEGATIVE
Protein, ur: NEGATIVE
Urobilinogen, UA: 0.2
pH: 5.5
pH: 6

## 2011-09-29 LAB — POCT URINALYSIS DIP (DEVICE)
Glucose, UA: NEGATIVE
Glucose, UA: NEGATIVE
Hgb urine dipstick: NEGATIVE
Hgb urine dipstick: NEGATIVE
Ketones, ur: NEGATIVE
Ketones, ur: NEGATIVE
Nitrite: NEGATIVE
Operator id: 120861
Operator id: 148111
Protein, ur: 30 — AB
Protein, ur: NEGATIVE
Specific Gravity, Urine: >=1.03
Specific Gravity, Urine: >=1.03
Specific Gravity, Urine: >=1.03
Urobilinogen, UA: 0.2
Urobilinogen, UA: 0.2
Urobilinogen, UA: 0.2
pH: 6
pH: 6

## 2011-09-29 LAB — URINE MICROSCOPIC-ADD ON

## 2011-09-29 LAB — URINE CULTURE: Colony Count: 15000

## 2011-09-30 LAB — POCT URINALYSIS DIP (DEVICE)
Ketones, ur: 15 — AB
Ketones, ur: 80 — AB
Protein, ur: 30 — AB
Protein, ur: 30 — AB
Specific Gravity, Urine: 1.025
Specific Gravity, Urine: >=1.03
Urobilinogen, UA: 1
Urobilinogen, UA: 1
pH: 6
pH: 7

## 2011-10-01 LAB — POCT URINALYSIS DIP (DEVICE)
Hgb urine dipstick: NEGATIVE
Hgb urine dipstick: NEGATIVE
Ketones, ur: NEGATIVE
Protein, ur: NEGATIVE
Protein, ur: NEGATIVE
Specific Gravity, Urine: 1.025
Specific Gravity, Urine: >=1.03
pH: 6
pH: 6

## 2012-05-01 ENCOUNTER — Encounter (HOSPITAL_COMMUNITY): Payer: Self-pay | Admitting: *Deleted

## 2012-05-01 ENCOUNTER — Emergency Department (HOSPITAL_COMMUNITY)
Admission: EM | Admit: 2012-05-01 | Discharge: 2012-05-01 | Disposition: A | Discharge status: Home / Self Care | Payer: Medicaid Other | Attending: Emergency Medicine | Admitting: Emergency Medicine

## 2012-05-01 DIAGNOSIS — Z794 Long term (current) use of insulin: Secondary | ICD-10-CM | POA: Insufficient documentation

## 2012-05-01 DIAGNOSIS — E119 Type 2 diabetes mellitus without complications: Secondary | ICD-10-CM | POA: Insufficient documentation

## 2012-05-01 DIAGNOSIS — L03019 Cellulitis of unspecified finger: Secondary | ICD-10-CM | POA: Insufficient documentation

## 2012-05-01 DIAGNOSIS — IMO0002 Reserved for concepts with insufficient information to code with codable children: Secondary | ICD-10-CM

## 2012-05-01 DIAGNOSIS — I10 Essential (primary) hypertension: Secondary | ICD-10-CM | POA: Insufficient documentation

## 2012-05-01 DIAGNOSIS — Z7982 Long term (current) use of aspirin: Secondary | ICD-10-CM | POA: Insufficient documentation

## 2012-05-01 HISTORY — DX: Essential (primary) hypertension: I10

## 2012-05-01 NOTE — Discharge Instructions (Signed)
Keep your wound clean and dry.  Take motrin for pain. Follow up with your doctor as needed.  Return for worse symptoms.

## 2012-05-01 NOTE — ED Provider Notes (Signed)
History   This chart was scribed for Lindsay Guppy, MD by Melba Coon. The patient was seen in room STRE5/STRE5 and the patient's care was started at 1:55PM.    CSN: [id number]  Arrival date & time 05/01/12  1255   None     Chief Complaint  Patient presents with  . Finger Injury    (Consider location/radiation/quality/duration/timing/severity/associated sxs/prior treatment) HPI Lindsay [name] is a 31 y.o. female who presents to the Emergency Department complaining of constant, moderate to severe right middle finger pain and swelling with an onset 4 days ago. Pt states that pus is discharging from the nail bed of the finger. No HA, fever, neck pain, sore throat, rash, back pain, CP, SOB, abd pain, n/v/d, dysuria, or extremity weakness, numbness, or tingling. No known allergies. No other pertinent medical symptoms.  Past Medical History  Diagnosis Date  . Hypertension   . Diabetes mellitus   . Asthma     History reviewed. No pertinent past surgical history.  History reviewed. No pertinent family history.  History  Substance Use Topics  . Smoking status: Never Smoker   . Smokeless tobacco: Not on file  . Alcohol Use: No    OB History    Grav Para Term Preterm Abortions TAB SAB Ect Mult Living                  Review of Systems 10 Systems reviewed and all are negative for acute change except as noted in the HPI.   Allergies  Review of patient's allergies indicates no known allergies.  Home Medications   Current Outpatient Rx  Name Route Sig Dispense Refill  . ASPIRIN 81 MG PO TABS Oral Take 81 mg by mouth daily.    Marland Kitchen HUMALOG 100 UNIT/ML [place] SOLN  INJECT 20 UNITS THREE TIMES DAILY BEFORE EACH MEAL AND SLIDING SCALE CURRENTLY ON 30 mL 0  . INSULIN GLARGINE 100 UNIT/ML [place] SOLN Subcutaneous Inject 90 Units into the skin 2 (two) times daily. 30 units in the morning, 60 units at night    . METFORMIN HCL 1000 MG PO TABS Oral Take 1,000 mg by mouth 2 (two) times  daily with a meal.    . RAMIPRIL 5 MG PO CAPS Oral Take 5 mg by mouth daily.      BP 152/100  Pulse 75  Temp(Src) 98.2 F (36.8 C) (Oral)  Resp 18  SpO2 100%  Physical Exam  Nursing note and vitals reviewed. Constitutional: She is oriented to person, place, and time. She appears well-developed and well-nourished. No distress.  HENT:  Head: Normocephalic and atraumatic.  Eyes: EOM are normal.  Neck: Neck supple. No tracheal deviation present.  Cardiovascular: Normal rate.   Pulmonary/Chest: Effort normal. No respiratory distress.  Musculoskeletal: Normal range of motion. She exhibits edema (Swelling on right 3rd digit with infection c/w perionychia).  Neurological: She is alert and oriented to person, place, and time.  Skin: Skin is warm and dry.  Psychiatric: She has a normal mood and affect. Her behavior is normal.    ED Course  Procedures (including critical care time)  DIAGNOSTIC STUDIES: Oxygen Saturation is 100% on room air, normal by my interpretation.    COORDINATION OF CARE:  2:00PM - Pt will need I&D on the finger  INCISION AND DRAINAGE PROCEDURE NOTE: Patient identification was confirmed and verbal consent was obtained. This procedure was performed by Lindsay Guppy, MD at 2:17 PM. Site: right 3rd digit Sterile procedures observed Needle size: 27  gauge Anesthetic used (type and amt): lidocaine 2% with epinephrine; 1 mL Blade size: 11 Drainage: small amount Complexity: Simple Packing used none Site anesthetized, incision made over site, wound drained and explored loculations, rinsed with copious amounts of normal saline, wound packed with sterile gauze, covered with dry, sterile dressing.  Pt tolerated procedure well without complications.  Instructions for care discussed verbally and pt provided with additional written instructions for homecare and f/u.   2:24PM - EDMD will Rx abx and advised pt to wash affected area with soap and water; pt ready  for d/c   Labs Reviewed - No data to display No results found.   No diagnosis found.    MDM  Paronychia I personally performed the services described in this documentation, which was scribed in my presence. The recorded information has been reviewed and considered.         Lindsay Guppy, MD 05/01/12 1427

## 2012-05-01 NOTE — ED Notes (Signed)
Pt reports welling to right middle finger at nail bed.

## 2014-07-11 ENCOUNTER — Encounter (HOSPITAL_COMMUNITY): Payer: Self-pay | Admitting: Emergency Medicine

## 2014-07-11 ENCOUNTER — Emergency Department (HOSPITAL_COMMUNITY)
Admission: EM | Admit: 2014-07-11 | Discharge: 2014-07-11 | Disposition: A | Discharge status: Home / Self Care | Payer: Medicaid Other | Attending: Emergency Medicine | Admitting: Emergency Medicine

## 2014-07-11 DIAGNOSIS — L0291 Cutaneous abscess, unspecified: Secondary | ICD-10-CM

## 2014-07-11 DIAGNOSIS — I10 Essential (primary) hypertension: Secondary | ICD-10-CM | POA: Diagnosis not present

## 2014-07-11 DIAGNOSIS — E1165 Type 2 diabetes mellitus with hyperglycemia: Secondary | ICD-10-CM

## 2014-07-11 DIAGNOSIS — Z7982 Long term (current) use of aspirin: Secondary | ICD-10-CM | POA: Diagnosis not present

## 2014-07-11 DIAGNOSIS — Z794 Long term (current) use of insulin: Secondary | ICD-10-CM | POA: Insufficient documentation

## 2014-07-11 DIAGNOSIS — IMO0002 Reserved for concepts with insufficient information to code with codable children: Secondary | ICD-10-CM | POA: Insufficient documentation

## 2014-07-11 DIAGNOSIS — E119 Type 2 diabetes mellitus without complications: Secondary | ICD-10-CM | POA: Diagnosis not present

## 2014-07-11 DIAGNOSIS — J45909 Unspecified asthma, uncomplicated: Secondary | ICD-10-CM | POA: Diagnosis not present

## 2014-07-11 DIAGNOSIS — R5381 Other malaise: Secondary | ICD-10-CM | POA: Diagnosis present

## 2014-07-11 DIAGNOSIS — R5383 Other fatigue: Secondary | ICD-10-CM | POA: Diagnosis present

## 2014-07-11 LAB — CBC
HCT: 36.4 % (ref 36.0–46.0)
HEMOGLOBIN: 11.8 g/dL — AB (ref 12.0–15.0)
MCH: 25.7 pg — ABNORMAL LOW (ref 26.0–34.0)
MCHC: 32.4 g/dL (ref 30.0–36.0)
MCV: 79.3 fL (ref 78.0–100.0)
PLATELETS: 231 10*3/uL (ref 150–400)
RBC: 4.59 MIL/uL (ref 3.87–5.11)
RDW: 13.5 % (ref 11.5–15.5)
WBC: 8.3 10*3/uL (ref 4.0–10.5)

## 2014-07-11 LAB — COMPREHENSIVE METABOLIC PANEL
ALBUMIN: 3.2 g/dL — AB (ref 3.5–5.2)
ALT: 11 U/L (ref 0–35)
AST: 12 U/L (ref 0–37)
Alkaline Phosphatase: 110 U/L (ref 39–117)
Anion gap: 15 (ref 5–15)
BILIRUBIN TOTAL: 0.6 mg/dL (ref 0.3–1.2)
BUN: 9 mg/dL (ref 6–23)
CALCIUM: 9 mg/dL (ref 8.4–10.5)
CHLORIDE: 95 meq/L — AB (ref 96–112)
CO2: 23 meq/L (ref 19–32)
CREATININE: 0.54 mg/dL (ref 0.50–1.10)
GFR calc Af Amer: >90 mL/min (ref >90)
Glucose, Bld: 441 mg/dL — ABNORMAL HIGH (ref 70–99)
Potassium: 4.1 mEq/L (ref 3.7–5.3)
SODIUM: 133 meq/L — AB (ref 137–147)
Total Protein: 7.6 g/dL (ref 6.0–8.3)

## 2014-07-11 LAB — CBG MONITORING, ED
GLUCOSE-CAPILLARY: 440 mg/dL — AB (ref 70–99)
GLUCOSE-CAPILLARY: 256 mg/dL — AB (ref 70–99)

## 2014-07-11 MED ORDER — INSULIN ASPART 100 UNIT/ML FLEXPEN: PEN_INJECTOR | SUBCUTANEOUS | Status: DC

## 2014-07-11 MED ORDER — OXYCODONE-ACETAMINOPHEN 5-325 MG PO TABS: 1.0000 | ORAL_TABLET | Freq: Four times a day (QID) | ORAL | Status: DC | PRN

## 2014-07-11 MED ORDER — SODIUM CHLORIDE 0.9 % IV SOLN
INTRAVENOUS | Status: DC
  Filled 2014-07-11: qty 1

## 2014-07-11 MED ORDER — SODIUM CHLORIDE 0.9 % IV BOLUS (SEPSIS)
2000.0000 mL | Freq: Once | INTRAVENOUS | Status: AC
  Administered 2014-07-11: 2000 mL via INTRAVENOUS

## 2014-07-11 MED ORDER — INSULIN GLARGINE 100 UNITS/ML SOLOSTAR PEN: 65.0000 [IU] | PEN_INJECTOR | Freq: Every day | SUBCUTANEOUS | Status: DC

## 2014-07-11 MED ORDER — DEXTROSE-NACL 5-0.45 % IV SOLN: INTRAVENOUS | Status: DC

## 2014-07-11 MED ORDER — SODIUM CHLORIDE 0.9 % IV BOLUS (SEPSIS): 1000.0000 mL | Freq: Once | INTRAVENOUS | Status: DC

## 2014-07-11 NOTE — ED Provider Notes (Signed)
Plains of fatigue for 2 months since she ran out of all her medications. She's had no insulin or metformin for the past 2 months. Patient alert no distress patient also has abscess the left posterior shoulder. On exam alert no distress Glasgow Coma Score 15. Left upper extremity with fluctuant area approximately 2 cm diameter, draining greenish yellow pus  Doug Sou, MD 07/12/14 [phone]

## 2014-07-11 NOTE — ED Notes (Signed)
Pt staes for the last 3-4 days shes felt dizzy, states shes had a lot of errands to run and was tired from that. Also states she feels really dehydrated since she doesn't check her blood sugar very often. States she no longer has a meter. Requesting case management to get resources. Pt also has a bite on her left shoulder that's draining tan yellowish fluid. Unsure what bit her. Bandage applied.

## 2014-07-11 NOTE — Progress Notes (Addendum)
ED CM consulted by [facility] RN on Pod A concerning patient needing f/u care for diabetes. Pt presented to [facility] ED with c/o dizziness, and fatigue. Pt is a diabetic on insulin and has not checked glucose patient reports not having a meter, and has ran out of medication for over 2 months. Pt states she has an active [place] Medicaid but does not have a PCP. Discussed [facility] for establishing care, patient agreeable. Provided [facility] brochure to patient with address and phone number. Offered to assist patient with scheduling appointment patient agreeable. Pt advised to walk-in the morning to schedule a f/u app and to bring any prescription that she receives tonight to the [facility] pharmacy. ED CM will contact the clinic via email to notify  the clinic of patient. Pt verbalized understanding and appreciative of the assistance. In the ED CBG 441,NS IV bolus was given. ED eval still pending. Pt verblized understanding that she is to walk in tomorrow to [facility] to establish care teach back done.  No furhter ED CM needs identified

## 2014-07-11 NOTE — Discharge Instructions (Signed)
Keep wound clean and dry. Apply warm compresses to affected area throughout the day. Take percocet as directed, as needed for pain but do not drive or operate machinery with pain medication use. Followup with [facility]Primary Care doctor in 2 days for wound recheck.  Return to emergency department for emergent changing or worsening symptoms. Take your insulin as directed, and find a primary care doctor based on the lists given to you from the case manager.   Abscess Care After An abscess (also called a boil or furuncle) is an infected area that contains a collection of pus. Signs and symptoms of an abscess include pain, tenderness, redness, or hardness, or you may feel a moveable soft area under your skin. An abscess can occur anywhere in the body. The infection may spread to surrounding tissues causing cellulitis. A cut (incision) by the surgeon was made over your abscess and the pus was drained out. Gauze may have been packed into the space to provide a drain that will allow the cavity to heal from the inside outwards. The boil may be painful for 5 to 7 days. Most people with a boil do not have high fevers. Your abscess, if seen early, may not have localized, and may not have been lanced. If not, another appointment may be required for this if it does not get better on its own or with medications. HOME CARE INSTRUCTIONS   Only take over-the-counter or prescription medicines for pain, discomfort, or fever as directed by your caregiver.  When you bathe, soak and then remove gauze or iodoform packs at least daily or as directed by your caregiver. You may then wash the wound gently with mild soapy water. Repack with gauze or do as your caregiver directs. SEEK IMMEDIATE MEDICAL CARE IF:   You develop increased pain, swelling, redness, drainage, or bleeding in the wound site.  You develop signs of generalized infection including muscle aches, chills, fever, or a general ill feeling.  An  oral temperature above 102 F (38.9 C) develops, not controlled by medication. See your caregiver for a recheck if you develop any of the symptoms described above. If medications (antibiotics) were prescribed, take them as directed. Document Released: 06/25/2005 Document Revised: 02/29/2012 Document Reviewed: 02/20/2008 [facility] Patient Information 2015 [place], [place]. This information is not intended to replace advice given to you by your health care provider. Make sure you discuss any questions you have with your health care provider.  Type 2 Diabetes Mellitus Type 2 diabetes mellitus is a long-term (chronic) disease. In type 2 diabetes:  The pancreas does not make enough of a hormone called insulin.  The cells in the body do not respond as well to the insulin that is made.  Both of the above can happen. Normally, insulin moves sugars from food into tissue cells. This gives you energy. If you have type 2 diabetes, sugars cannot be moved into tissue cells. This causes high blood sugar (hyperglycemia).  HOME CARE  Have your hemoglobin A1c level checked twice a year. The level shows if your diabetes is under control or out of control.  Test your blood sugar level every day as told by your doctor.  Check your ketone levels by testing your pee (urine) when you are sick and as told.  Take your diabetes or insulin medicine as told by your doctor.  Never run out of insulin.  Adjust how much insulin you give yourself based on how many carbs (carbohydrates) you eat. Carbs are in  many foods, such as fruits, vegetables, whole grains, and dairy products.  Have a healthy snack between every healthy meal. Have 3 meals and 3 snacks a day.  Lose weight if you are overweight.  Carry a medical alert card or wear your medical alert jewelry.  Carry a 15-gram carb snack with you at all times. Examples include:  Glucose pills, 3 or 4.  Glucose gel, 15-gram tube.  Raisins, 2 tablespoons (24  grams).  Jelly beans, 6.  Animal crackers, 8.  Regular (not diet) pop, 4 ounces (120 milliliters).  Gummy treats, 9.  Notice low blood sugar (hypoglycemia) symptoms, such as:  Shaking (tremors).  Trouble thinking clearly.  Sweating.  Faster heart rate.  Headache.  Dry mouth.  Hunger.  Crabbiness (irritability).  Being worried or tense (anxious).  Restless sleep.  A change in speech or coordination.  Confusion.  Treat low blood sugar right away. If you are alert and can swallow, follow the 15:15 rule:  Take 15-20 grams of a rapid-acting glucose or carb. This includes glucose gel, glucose pills, or 4 ounces (120 milliliters) of fruit juice, regular pop, or low-fat milk.  Check your blood sugar level 15 minutes after taking the glucose.  Take 15-20 grams more of glucose if the repeat blood sugar level is still 70 mg/dL (milligrams/deciliter) or below.  Eat a meal or snack within 1 hour of the blood sugar levels going back to normal.  Notice early symptoms of high blood sugar, such as:  Being really thirsty or drinking a lot (polydipsia).  Peeing a lot (polyuria).  Do at least 150 minutes of physical activity a week or as told.  Split the 150 minutes of activity up during the week. Do not do 150 minutes of activity in one day.  Perform exercises, such as weight lifting, at least 2 times a week or as told.  Spend no more than 90 minutes at one time inactive.  Adjust your insulin or food intake as needed if you start a new exercise or sport.  Follow your sick-day plan when you are not able to eat or drink as usual.  Do not smoke, chew tobacco, or use electronic cigarettes.  Women who are not pregnant should drink no more than 1 drink a day. Men should drink no more than 2 drinks a day.  Only drink alcohol with food.  Ask your doctor if alcohol is safe for you.  Tell your doctor if you drink alcohol several times during the week.  See your doctor  regularly.  Schedule an eye exam soon after you are told you have diabetes. Schedule exams once every year.  Check your skin and feet every day. Check for cuts, bruises, redness, nail problems, bleeding, blisters, or sores. A doctor should do a foot exam once a year.  Brush your teeth and gums twice a day. Floss once a day. Visit your dentist regularly.  Share your diabetes plan with your workplace or school.  Stay up-to-date with shots that fight against diseases (immunizations).  Learn how to deal with stress.  Get diabetes education and support as needed.  Ask your doctor for special help if:  You need help to maintain or improve how you do things on your own.  You need help to maintain or improve the quality of your life.  You have foot or hand problems.  You have trouble cleaning yourself, dressing, eating, or doing physical activity. GET HELP IF:  You are unable to eat or drink  for more than 6 hours.  You feel sick to your stomach (nauseous) or throw up (vomit) for more than 6 hours.  Your blood sugar level is over 240 mg/dL.  There is a change in mental status.  You get another serious illness.  You have watery poop (diarrhea) for more than 6 hours.  You have been sick or have had a fever for 2 or more days and are not getting better.  You have pain when you are active. GET HELP RIGHT AWAY IF:  You have trouble breathing.  Your ketone levels are higher than your doctor says they should be. MAKE SURE YOU:  Understand these instructions.  Will watch your condition.  Will get help right away if you are not doing well or get worse. Document Released: 09/15/2008 Document Revised: 04/23/2014 Document Reviewed: 07/08/2012 [facility] Patient Information 2015 [place], [place]. This information is not intended to replace advice given to you by your health care provider. Make sure you discuss any questions you have with your health care provider.  Insulin Treatment  for Diabetes Diabetes is a disease that does not go away (chronic). It occurs when the body does not properly use the sugar (glucose) that is released from food after it is digested. Glucose levels are controlled by a hormone called insulin, which is made by your pancreas. Depending on the type of diabetes you have, either of the following will apply:   The pancreas does not make any insulin (type 1 diabetes).  The pancreas makes too little insulin, and the body cannot respond normally to the insulin that is made (type 2 diabetes). Without insulin, death can occur. However, with the addition of insulin, blood sugar monitoring, and treatment, someone with diabetes can live a full and productive life. This document will discuss the role of insulin in your treatment and provide information about its use.  HOW IS INSULIN GIVEN? Insulin is a medicine that can only be given by injection. Taking it by mouth makes it inactive because of the acid in your stomach. Insulin is injected under the skin by a syringe and needle, an insulin pen, a pump, or a jet injector. Your dose will be determined by your health care provider based on your individual needs. You will also be given guidance on which method of giving insulin is right for you. Remember that if you give insulin with a needle and syringe, you must do so using only a special insulin syringe made for this purpose. WHERE ON THE BODY SHOULD INSULIN BE INJECTED? Insulin is injected into the fatty layer of tissue just under your skin. Good places to inject insulin include the upper arm, the front and outer area of the thigh, the hips, and the abdomen. Giving your insulin in the abdomen is preferred because this provides the most rapid and consistent absorption. Avoid the area 2 inches (5 cm) around the navel and avoid injecting into areas on your body with scar tissue. In addition, it is important to rotate your injection sites with every shot to prevent irritation  and improve absorption. WHAT ARE THE DIFFERENT TYPES OF INSULIN?  If you have type 1 diabetes, you must take insulin to stay alive. Your body does not produce it. If you have type 2 diabetes, you might require insulin in addition to, or instead of, other medicines. In either case, proper use of insulin is critical to control your diabetes.  There are a number of different types of insulin. Usually, you will give  yourself injections, though others can be trained to give them to you. Some people have an insulin pump that delivers insulin continuously through a tube (cannula) that is placed under the skin. Using insulin requires that you check your blood sugar several times a day. The exact number of times and time of day to check will vary depending on your type of diabetes, your type of insulin, and treatment goals. Your health care provider will direct you.  Generally, different insulins have different properties. The following is a general guide. Specifics will vary by product, and new products are introduced periodically.   Rapid-acting insulin starts working quickly (in as little as 5 minutes) and wears off in 4 to 6 hours (sometimes longer). This type of insulin works well when taken just before a meal to bring your blood sugar quickly back to normal.   Short-acting insulin starts working in about 30 minutes and can last 6 to 10 hours. This type of insulin should be taken about 30 minutes before you start eating a meal.  Intermediate-acting insulin starts working in 1-2 hours and wears off after about 10 to 18 hours. This insulin will lower your blood sugar for a longer period of time, but it will not be as effective in lowering your blood sugar right after a meal.   Long-acting insulin mimics the small amount of insulin that your pancreas usually produces throughout the day. You need to have some insulin present at all times. It is crucial to the metabolism of brain cells and other cells.  Long-acting insulin is meant to be used either once or twice a day. It is usually used in combination with other types of insulin, or in combination with other diabetes medicines.  Discuss the type of insulin you are taking with your health care provider or pharmacist. You will then be aware of when the insulin can be expected to peak and when it will wear off. This is important to know so you can plan for meal times and periods of exercise.  Your health care provider will usually have a strategy in mind when treating you with insulin. This will vary with your type of diabetes, your diabetes treatment goals, and your health history. It is important that you understand this strategy so you can be an active partner in treating your diabetes. Here are some terms you might hear:   Basal insulin. This refers to the small amount of insulin that needs to be present in your blood at all times. Sometimes oral medicines will be enough. For other people, and especially for people with type 1 diabetes, insulin is needed. Usually, intermediate-acting or long-acting insulin is used once or twice a day to accomplish this.   Prandial (meal-related) insulin. Your blood sugar will rise rapidly after a meal. Rapid-acting or short-acting insulin can be used right before the meal to bring your blood sugar back to normal quickly. You might be instructed to adjust the amount of insulin depending on how much carbohydrate (starch) is in your meal.   Corrective insulin. You might be instructed to check your blood sugar at certain times of the day. You then might use a small amount of rapid-acting or short-acting insulin to bring the blood sugar down to normal if it is elevated.   Tight control (also called intensive therapy). Tight control means keeping your blood sugar as close to your target as possible and keeping it from going too high after meals. People with tight control of their  diabetes are shown to have fewer  long-term problems from their diabetes.   Glycohemoglobin (also called glyco, glycosylated hemoglobin, hemoglobin A1c, or A1c) level. This measures how well your blood sugar has been controlled during the past 1 to 3 months. It helps your health care provider see how effective your treatment is and decide if any changes are needed. Your health care provider will discuss your target glycohemoglobin level with you.  Insulin treatment requires your careful attention. Treatment plans will be different for different people. Some people do well with a simple program. Others require more complicated programs, with multiple insulin injections daily. You will work with your health care provider to develop the best program for you. Regardless of your insulin treatment plan, you must also do your best on weight control, diet and food choices, exercise, blood pressure control, cholesterol control, and stress levels.  WHAT ARE THE SIDE EFFECTS OF INSULIN? Although insulin treatment is important, it does have some side effects, such as:   Insulin can cause your blood sugar to go too low (hypoglycemia).   Weight gain can occur.   Improper injection technique can cause hypoglycemia, blood sugar to go too high (hyperglycemia), skin injury or irritation, or other problems. You must learn to inject insulin properly. Document Released: 03/05/2009 Document Revised: 04/23/2014 Document Reviewed: 05/21/2013 [facility] Patient Information 2015 [place], [place]. This information is not intended to replace advice given to you by your health care provider. Make sure you discuss any questions you have with your health care provider.

## 2014-07-11 NOTE — ED Provider Notes (Signed)
CSN: [id number]     Arrival date & time 07/11/14  1529 History   First MD Initiated Contact with Patient 07/11/14 2008     Chief Complaint  Patient presents with  . Fatigue     (Consider location/radiation/quality/duration/timing/severity/associated sxs/prior Treatment) HPI Comments: Lindsay [name] is a 33 y.o. Female with a PMHx of HTN and DM presents today with two days of fatigue and dizziness with standing. States she is diabetic and has not had her insulin for 2 months due to having her doctor move recently and not getting a new one, and has not been checking her CBGs in the same amount of time. States that her fatigue worsens with going out in the heat, and exerting herself. States that she was having intermittent dizziness with standing which has since resolved. Denies HA, CP, palpitations, SOB, abd pain, N/V/D/C, dysuria, hematuria, vaginal discharge/bleeding, hematochezia, melena, myalgias, arthralgias, paresthesias, focal weakness, fevers or chills. States that usually her BP is elevated but she hasn't been taking her BP meds recently. Also, noticed an abscess on her L shoulder starting 2-3 days ago which opened up today when she put her bra on. States it's been draining purulent material since this afternoon. Denies red streaking or worsening redness.  Patient is a 33 y.o. female presenting with hyperglycemia. The history is provided by the patient. No language interpreter was used.  Hyperglycemia Severity:  Unable to specify Onset quality:  Unable to specify Duration:  3 days Timing:  Constant Progression:  Unchanged Chronicity:  Recurrent Diabetes status:  Controlled with insulin (but currently not taking them) Current diabetic therapy:  Novolog and lantus Time since last antidiabetic medication:  2 months Context: noncompliance   Relieved by:  None tried Ineffective treatments:  None tried Associated symptoms: dehydration and fatigue   Associated symptoms: no abdominal  pain, no altered mental status, no blurred vision, no chest pain, no confusion, no diaphoresis, no dizziness, no dysuria, no fever, no increased appetite, no increased thirst, no nausea, no polyuria, no shortness of breath, no syncope, no vomiting and no weakness   Risk factors: obesity     Past Medical History  Diagnosis Date  . Hypertension   . Diabetes mellitus   . Asthma    History reviewed. No pertinent past surgical history. History reviewed. No pertinent family history. History  Substance Use Topics  . Smoking status: Never Smoker   . Smokeless tobacco: Not on file  . Alcohol Use: No   OB History   Grav Para Term Preterm Abortions TAB SAB Ect Mult Living                 Review of Systems  Constitutional: Positive for fatigue. Negative for fever, chills and diaphoresis.  HENT: Negative for congestion and rhinorrhea.   Eyes: Negative for blurred vision, photophobia, pain and visual disturbance.  Respiratory: Negative for cough, chest tightness, shortness of breath and wheezing.   Cardiovascular: Negative for chest pain, palpitations, leg swelling and syncope.  Gastrointestinal: Negative for nausea, vomiting, abdominal pain, diarrhea and constipation.  Endocrine: Negative for polydipsia and polyuria.  Genitourinary: Negative for dysuria, urgency, hematuria, flank pain, decreased urine volume, vaginal bleeding, vaginal discharge, difficulty urinating and vaginal pain.  Musculoskeletal: Negative for arthralgias, myalgias, neck pain and neck stiffness.  Skin: Positive for wound.  Neurological: Positive for light-headedness (when standing). Negative for dizziness, tremors, speech difficulty, weakness, numbness and headaches.  Psychiatric/Behavioral: Negative for confusion.  10 Systems reviewed and are negative for acute change except  as noted in the HPI.     Allergies  Review of patient's allergies indicates no known allergies.  Home Medications   Prior to Admission  medications   Medication Sig Start Date End Date Taking? Authorizing Provider  acetaminophen (TYLENOL) 500 MG tablet Take 1,000 mg by mouth every 6 (six) hours as needed for moderate pain.   Yes Historical Provider, MD  aspirin 81 MG tablet Take 81 mg by mouth daily.   Yes Historical Provider, MD  Neomycin-Bacitracin-Polymyxin (NEOSPORIN EX) Apply 1 application topically daily as needed (for bites).   Yes Historical Provider, MD  insulin aspart (NOVOLOG) 100 UNIT/ML FlexPen Inject 20 units with breakfast and lunch, and 25 units before supper daily, subcutaneous 07/11/14   Ailanie Ruttan Strupp Camprubi-Soms, PA-C  insulin glargine (LANTUS) 100 unit/mL SOPN Inject 0.65 mLs (65 Units total) into the skin at bedtime. 07/11/14   Kamiyah Kindel Strupp Camprubi-Soms, PA-C  oxyCODONE-acetaminophen (PERCOCET) 5-325 MG per tablet Take 1-2 tablets by mouth every 6 (six) hours as needed for severe pain. 07/11/14   Ethelbert Thain Strupp Camprubi-Soms, PA-C   BP 111/83  Pulse 83  Temp(Src) 98.3 F (36.8 C)  Resp 18  SpO2 96% Physical Exam  Nursing note and vitals reviewed. Constitutional: She is oriented to person, place, and time. Vital signs are normal. She appears well-developed and well-nourished. No distress.  Afebrile, NAD  HENT:  Head: Normocephalic and atraumatic.  Nose: Nose normal.  Mouth/Throat: Uvula is midline, oropharynx is clear and moist and mucous membranes are normal.  Eyes: Conjunctivae and EOM are normal. Pupils are equal, round, and reactive to light. Right eye exhibits no discharge. Left eye exhibits no discharge.  Neck: Normal range of motion. Neck supple. No spinous process tenderness and no muscular tenderness present. No rigidity. Normal range of motion present.  FROM intact, no spinous process or muscle TTP, no rigidity or meningeal signs  Cardiovascular: Normal rate, regular rhythm, normal heart sounds and intact distal pulses.   No murmur heard. Pulmonary/Chest: Effort normal and breath sounds  normal. No respiratory distress. She has no decreased breath sounds. She has no wheezes. She has no rales.  Abdominal: Soft. Normal appearance and bowel sounds are normal. She exhibits no distension. There is no tenderness. There is no rigidity, no rebound and no guarding.  Musculoskeletal: Normal range of motion.  Neurological: She is alert and oriented to person, place, and time. She has normal strength. No cranial nerve deficit or sensory deficit.  CNII-XII grossly intact, sensation grossly intact in all extremities, strength 5/5 in all extremities.  Skin: Skin is warm and dry. No rash noted. There is erythema.     Approximately 10cm area of induration over L shoulder, with small approx 2cm area of fluctuance centrally which is draining purulent material. Area TTP. No warmth or red streaking.  Psychiatric: She has a normal mood and affect.    ED Course  INCISION AND DRAINAGE Date/Time: 07/11/2014 9:55 PM Performed by: Marjean DonnaAMPRUBI-SOMS, Talon Regala STRUPP Authorized by: Ramond MarrowAMPRUBI-SOMS, Samora Jernberg STRUPP Consent: Verbal consent obtained. Risks and benefits: risks, benefits and alternatives were discussed Consent given by: patient Patient understanding: patient states understanding of the procedure being performed Patient consent: the patient's understanding of the procedure matches consent given Patient identity confirmed: verbally with patient Type: abscess Body area: upper extremity Location details: left shoulder Anesthesia: local infiltration Local anesthetic: lidocaine 2% with epinephrine Anesthetic total: 1 ml Patient sedated: no Scalpel size: 11 Needle gauge: 22 Incision type: single straight Complexity: simple Drainage: purulent and  serosanguinous  Drainage amount: scant Wound treatment: wound left open Patient tolerance: Patient tolerated the procedure well with no immediate complications.   (including critical care time) Labs Review Labs Reviewed  CBC - Abnormal; Notable  for the following:    Hemoglobin 11.8 (*)    MCH 25.7 (*)    All other components within normal limits  COMPREHENSIVE METABOLIC PANEL - Abnormal; Notable for the following:    Sodium 133 (*)    Chloride 95 (*)    Glucose, Bld 441 (*)    Albumin 3.2 (*)    All other components within normal limits  CBG MONITORING, ED - Abnormal; Notable for the following:    Glucose-Capillary 440 (*)    All other components within normal limits  CBG MONITORING, ED - Abnormal; Notable for the following:    Glucose-Capillary 256 (*)    All other components within normal limits    Imaging Review No results found.   EKG Interpretation None      MDM   Final diagnoses:  Hyperglycemia due to type 2 diabetes mellitus  Abscess    Lindsay [name] is a 33 y.o. female with a PMHx of HTN and DM presenting with fatigue and dizziness upon standing for the last 3-4 days. Has not been on her insulin due to a gap in PCP care after her prior doctor moved away. Neuro exam benign, VSS, afebrile and nontoxic. Labs obtained in triage showing CBG of 441 which is the likely cause of her fatigue. CBC consistent with prior results, showing minimally low Hgb. CMP showing mildly low Na and Cl and again a CBG of 441. No anion gap. Will give 2L bolus and insulin to bring her BS down. Will I&D abcsess of L shoulder. I doubt neurological etiology for fatigue and dizziness, with a nonfocal neuro exam and an alternate source.  10:02 PM After 2L bolus, BS down to 256, no insulin given. I&D with scant purulent/serosanguinous discharge, no packing or abx at this time, will have pt f/up in 2 days at [facility] or pcp given to her by case management. Refills of her insulin given. Discussed using warm compress to the area and change dressing twice daily. I explained the diagnosis and have given explicit precautions to return to the ER including for any other new or worsening symptoms. The patient understands and accepts the medical plan as it's  been dictated and I have answered their questions. Discharge instructions concerning home care and prescriptions have been given. The patient is STABLE and is discharged to home in good condition.  BP 111/83  Pulse 83  Temp(Src) 98.3 F (36.8 C)  Resp 18  SpO2 96%   [employer], PA-C 07/12/14 [phone]

## 2014-07-11 NOTE — ED Notes (Signed)
PA at bedside.

## 2014-07-11 NOTE — ED Notes (Signed)
Pt placed in gown and in bed. PT monitored by pulse ox, bp cuff, and 5-lead.

## 2014-07-11 NOTE — ED Notes (Signed)
Pt in c/o generalized fatigue and dizziness with position change over the last week, denies pain, states she is diabetic and has not checked her glucose levels in a month, states she has been out of her insulin x2 months. Also reports and abscess to left shoulder that started draining today.

## 2014-07-12 NOTE — ED Provider Notes (Signed)
Medical screening examination/treatment/procedure(s) were conducted as a shared visit with non-physician practitioner(s) and myself.  I personally evaluated the patient during the encounter.   EKG Interpretation None       Doug Sou, MD 07/12/14 [id number]

## 2014-07-16 ENCOUNTER — Encounter: Payer: Self-pay | Admitting: Internal Medicine

## 2014-07-16 ENCOUNTER — Ambulatory Visit: Payer: Medicaid Other | Attending: Internal Medicine | Admitting: Internal Medicine

## 2014-07-16 VITALS — BP 143/89 | HR 77 | Temp 98.0°F | Resp 16 | Wt 338.4 lb

## 2014-07-16 DIAGNOSIS — E119 Type 2 diabetes mellitus without complications: Secondary | ICD-10-CM | POA: Insufficient documentation

## 2014-07-16 DIAGNOSIS — R030 Elevated blood-pressure reading, without diagnosis of hypertension: Secondary | ICD-10-CM

## 2014-07-16 DIAGNOSIS — IMO0002 Reserved for concepts with insufficient information to code with codable children: Secondary | ICD-10-CM | POA: Insufficient documentation

## 2014-07-16 DIAGNOSIS — E669 Obesity, unspecified: Secondary | ICD-10-CM | POA: Diagnosis not present

## 2014-07-16 DIAGNOSIS — Z79899 Other long term (current) drug therapy: Secondary | ICD-10-CM | POA: Diagnosis not present

## 2014-07-16 DIAGNOSIS — L03114 Cellulitis of left upper limb: Secondary | ICD-10-CM

## 2014-07-16 DIAGNOSIS — E089 Diabetes mellitus due to underlying condition without complications: Secondary | ICD-10-CM | POA: Insufficient documentation

## 2014-07-16 DIAGNOSIS — E139 Other specified diabetes mellitus without complications: Secondary | ICD-10-CM

## 2014-07-16 DIAGNOSIS — IMO0001 Reserved for inherently not codable concepts without codable children: Secondary | ICD-10-CM

## 2014-07-16 DIAGNOSIS — Z794 Long term (current) use of insulin: Secondary | ICD-10-CM | POA: Diagnosis not present

## 2014-07-16 LAB — LIPID PANEL
CHOL/HDL RATIO: 3.4 ratio
Cholesterol: 165 mg/dL (ref 0–200)
HDL: 48 mg/dL (ref >39)
LDL Cholesterol: 93 mg/dL (ref 0–99)
TRIGLYCERIDES: 119 mg/dL (ref <150)
VLDL: 24 mg/dL (ref 0–40)

## 2014-07-16 LAB — CBC WITH DIFFERENTIAL/PLATELET
BASOS PCT: 1 % (ref 0–1)
Basophils Absolute: 0.1 10*3/uL (ref 0.0–0.1)
EOS ABS: 0.1 10*3/uL (ref 0.0–0.7)
Eosinophils Relative: 2 % (ref 0–5)
HEMATOCRIT: 36.7 % (ref 36.0–46.0)
HEMOGLOBIN: 12.4 g/dL (ref 12.0–15.0)
Lymphocytes Relative: 41 % (ref 12–46)
Lymphs Abs: 2.7 10*3/uL (ref 0.7–4.0)
MCH: 25.8 pg — AB (ref 26.0–34.0)
MCHC: 33.8 g/dL (ref 30.0–36.0)
MCV: 76.5 fL — ABNORMAL LOW (ref 78.0–100.0)
MONO ABS: 0.4 10*3/uL (ref 0.1–1.0)
Monocytes Relative: 6 % (ref 3–12)
Neutro Abs: 3.3 10*3/uL (ref 1.7–7.7)
Neutrophils Relative %: 50 % (ref 43–77)
Platelets: 277 10*3/uL (ref 150–400)
RBC: 4.8 MIL/uL (ref 3.87–5.11)
RDW: 14.4 % (ref 11.5–15.5)
WBC: 6.6 10*3/uL (ref 4.0–10.5)

## 2014-07-16 LAB — GLUCOSE, POCT (MANUAL RESULT ENTRY)
POC Glucose: 263 mg/dl — AB (ref 70–99)
POC Glucose: 265 mg/dl — AB (ref 70–99)

## 2014-07-16 LAB — TSH: TSH: 1.23 u[IU]/mL (ref 0.350–4.500)

## 2014-07-16 LAB — HEMOGLOBIN A1C
Hgb A1c MFr Bld: 13 % — ABNORMAL HIGH (ref <5.7)
Mean Plasma Glucose: 326 mg/dL — ABNORMAL HIGH (ref <117)

## 2014-07-16 MED ORDER — METFORMIN HCL 1000 MG PO TABS: 1000.0000 mg | ORAL_TABLET | Freq: Two times a day (BID) | ORAL | Status: DC

## 2014-07-16 MED ORDER — INSULIN ASPART 100 UNIT/ML ~~LOC~~ SOLN
10.0000 [IU] | Freq: Once | SUBCUTANEOUS | Status: AC
  Administered 2014-07-16: 10 [IU] via SUBCUTANEOUS

## 2014-07-16 MED ORDER — CEPHALEXIN 500 MG PO CAPS: 500.0000 mg | ORAL_CAPSULE | Freq: Four times a day (QID) | ORAL | Status: DC

## 2014-07-16 MED ORDER — FREESTYLE SYSTEM KIT: 1.0000 | PACK | Freq: Three times a day (TID) | Status: DC

## 2014-07-16 NOTE — Patient Instructions (Signed)
Diabetes Mellitus and Food It is important for you to manage your blood sugar (glucose) level. Your blood glucose level can be greatly affected by what you eat. Eating healthier foods in the appropriate amounts throughout the day at about the same time each day will help you control your blood glucose level. It can also help slow or prevent worsening of your diabetes mellitus. Healthy eating may even help you improve the level of your blood pressure and reach or maintain a healthy weight.  HOW CAN FOOD AFFECT ME? Carbohydrates Carbohydrates affect your blood glucose level more than any other type of food. Your dietitian will help you determine how many carbohydrates to eat at each meal and teach you how to count carbohydrates. Counting carbohydrates is important to keep your blood glucose at a healthy level, especially if you are using insulin or taking certain medicines for diabetes mellitus. Alcohol Alcohol can cause sudden decreases in blood glucose (hypoglycemia), especially if you use insulin or take certain medicines for diabetes mellitus. Hypoglycemia can be a life-threatening condition. Symptoms of hypoglycemia (sleepiness, dizziness, and disorientation) are similar to symptoms of having too much alcohol.  If your health care provider has given you approval to drink alcohol, do so in moderation and use the following guidelines:  Women should not have more than one drink per day, and men should not have more than two drinks per day. One drink is equal to:  12 oz of beer.  5 oz of wine.  1 oz of hard liquor.  Do not drink on an empty stomach.  Keep yourself hydrated. Have water, diet soda, or unsweetened iced tea.  Regular soda, juice, and other mixers might contain a lot of carbohydrates and should be counted. WHAT FOODS ARE NOT RECOMMENDED? As you make food choices, it is important to remember that all foods are not the same. Some foods have fewer nutrients per serving than other  foods, even though they might have the same number of calories or carbohydrates. It is difficult to get your body what it needs when you eat foods with fewer nutrients. Examples of foods that you should avoid that are high in calories and carbohydrates but low in nutrients include:  Trans fats (most processed foods list trans fats on the Nutrition Facts label).  Regular soda.  Juice.  Candy.  Sweets, such as cake, pie, doughnuts, and cookies.  Fried foods. WHAT FOODS CAN I EAT? Have nutrient-rich foods, which will nourish your body and keep you healthy. The food you should eat also will depend on several factors, including:  The calories you need.  The medicines you take.  Your weight.  Your blood glucose level.  Your blood pressure level.  Your cholesterol level. You also should eat a variety of foods, including:  Protein, such as meat, poultry, fish, tofu, nuts, and seeds (lean animal proteins are best).  Fruits.  Vegetables.  Dairy products, such as milk, cheese, and yogurt (low fat is best).  Breads, grains, pasta, cereal, rice, and beans.  Fats such as olive oil, trans fat-free margarine, canola oil, avocado, and olives. DOES EVERYONE WITH DIABETES MELLITUS HAVE THE SAME MEAL PLAN? Because every person with diabetes mellitus is different, there is not one meal plan that works for everyone. It is very important that you meet with a dietitian who will help you create a meal plan that is just right for you. Document Released: 09/03/2005 Document Revised: 12/12/2013 Document Reviewed: 11/03/2013 ExitCare Patient Information 2015 [employer]. This   information is not intended to replace advice given to you by your health care provider. Make sure you discuss any questions you have with your health care provider. DASH Eating Plan DASH stands for "Dietary Approaches to Stop Hypertension." The DASH eating plan is a healthy eating plan that has been shown to reduce high  blood pressure (hypertension). Additional health benefits may include reducing the risk of type 2 diabetes mellitus, heart disease, and stroke. The DASH eating plan may also help with weight loss. WHAT DO I NEED TO KNOW ABOUT THE DASH EATING PLAN? For the DASH eating plan, you will follow these general guidelines:  Choose foods with a percent daily value for sodium of less than 5% (as listed on the food label).  Use salt-free seasonings or herbs instead of table salt or sea salt.  Check with your health care provider or pharmacist before using salt substitutes.  Eat lower-sodium products, often labeled as "lower sodium" or "no salt added."  Eat fresh foods.  Eat more vegetables, fruits, and low-fat dairy products.  Choose whole grains. Look for the word "whole" as the first word in the ingredient list.  Choose fish and skinless chicken or turkey more often than red meat. Limit fish, poultry, and meat to 6 oz (170 g) each day.  Limit sweets, desserts, sugars, and sugary drinks.  Choose heart-healthy fats.  Limit cheese to 1 oz (28 g) per day.  Eat more home-cooked food and less restaurant, buffet, and fast food.  Limit fried foods.  Cook foods using methods other than frying.  Limit canned vegetables. If you do use them, rinse them well to decrease the sodium.  When eating at a restaurant, ask that your food be prepared with less salt, or no salt if possible. WHAT FOODS CAN I EAT? Seek help from a dietitian for individual calorie needs. Grains Whole grain or whole wheat bread. Brown rice. Whole grain or whole wheat pasta. Quinoa, bulgur, and whole grain cereals. Low-sodium cereals. Corn or whole wheat flour tortillas. Whole grain cornbread. Whole grain crackers. Low-sodium crackers. Vegetables Fresh or frozen vegetables (raw, steamed, roasted, or grilled). Low-sodium or reduced-sodium tomato and vegetable juices. Low-sodium or reduced-sodium tomato sauce and paste. Low-sodium  or reduced-sodium canned vegetables.  Fruits All fresh, canned (in natural juice), or frozen fruits. Meat and Other Protein Products Ground beef (85% or leaner), grass-fed beef, or beef trimmed of fat. Skinless chicken or turkey. Ground chicken or turkey. Pork trimmed of fat. All fish and seafood. Eggs. Dried beans, peas, or lentils. Unsalted nuts and seeds. Unsalted canned beans. Dairy Low-fat dairy products, such as skim or 1% milk, 2% or reduced-fat cheeses, low-fat ricotta or cottage cheese, or plain low-fat yogurt. Low-sodium or reduced-sodium cheeses. Fats and Oils Tub margarines without trans fats. Light or reduced-fat mayonnaise and salad dressings (reduced sodium). Avocado. Safflower, olive, or canola oils. Natural peanut or almond butter. Other Unsalted popcorn and pretzels. The items listed above may not be a complete list of recommended foods or beverages. Contact your dietitian for more options. WHAT FOODS ARE NOT RECOMMENDED? Grains White bread. White pasta. White rice. Refined cornbread. Bagels and croissants. Crackers that contain trans fat. Vegetables Creamed or fried vegetables. Vegetables in a cheese sauce. Regular canned vegetables. Regular canned tomato sauce and paste. Regular tomato and vegetable juices. Fruits Dried fruits. Canned fruit in light or heavy syrup. Fruit juice. Meat and Other Protein Products Fatty cuts of meat. Ribs, chicken wings, bacon, sausage, bologna, salami, chitterlings, fatback, hot   dogs, bratwurst, and packaged luncheon meats. Salted nuts and seeds. Canned beans with salt. Dairy Whole or 2% milk, cream, half-and-half, and cream cheese. Whole-fat or sweetened yogurt. Full-fat cheeses or blue cheese. Nondairy creamers and whipped toppings. Processed cheese, cheese spreads, or cheese curds. Condiments Onion and garlic salt, seasoned salt, table salt, and sea salt. Canned and packaged gravies. Worcestershire sauce. Tartar sauce. Barbecue sauce.  Teriyaki sauce. Soy sauce, including reduced sodium. Steak sauce. Fish sauce. Oyster sauce. Cocktail sauce. Horseradish. Ketchup and mustard. Meat flavorings and tenderizers. Bouillon cubes. Hot sauce. Tabasco sauce. Marinades. Taco seasonings. Relishes. Fats and Oils Butter, stick margarine, lard, shortening, ghee, and bacon fat. Coconut, palm kernel, or palm oils. Regular salad dressings. Other Pickles and olives. Salted popcorn and pretzels. The items listed above may not be a complete list of foods and beverages to avoid. Contact your dietitian for more information. WHERE CAN I FIND MORE INFORMATION? National Heart, Lung, and [employer] Institute: www.nhlbi.nih.gov/health/health-topics/topics/dash/ Document Released: 11/26/2011 Document Revised: 04/23/2014 Document Reviewed: 10/11/2013 ExitCare Patient Information 2015 [employer]. This information is not intended to replace advice given to you by your health care provider. Make sure you discuss any questions you have with your health care provider.  

## 2014-07-16 NOTE — Progress Notes (Signed)
Patient Demographics  Lindsay [name], is a 33 y.o. female  YHT:[id number]  [id number]:[id number]  DOB - [birth year]  CC:  Chief Complaint  Patient presents with  . Hospitalization Follow-up       HPI: Lindsay [name] is a 33 y.o. female here today to establish medical care.History of diabetes, recently went to the emergency room with symptoms of dizziness fatigue, EMR reviewed patient was happened glycemic cyst she ran out of her medications, she also had a left shoulder abscess which was drained, patient was treated with insulin and IV fluids, she symptomatically improved, she was resumed back on Lantus as well as NovoLog. As per patient she used to take metformin as well and had followed her endocrinologist in the past, she did not take her insulin today in the morning and her blood sugar is elevated, she also reports some pain and discharge at the site of incision and drainage on the left upper extremity, currently denies any fever chills., Today her blood pressure is borderline elevated. Patient has No headache, No chest pain, No abdominal pain - No Nausea, No new weakness tingling or numbness, No Cough - SOB.  No Known Allergies Past Medical History  Diagnosis Date  . Hypertension   . Diabetes mellitus   . Asthma    Current Outpatient Prescriptions on File Prior to Visit  Medication Sig Dispense Refill  . acetaminophen (TYLENOL) 500 MG tablet Take 1,000 mg by mouth every 6 (six) hours as needed for moderate pain.      Marland Kitchen aspirin 81 MG tablet Take 81 mg by mouth daily.      . insulin aspart (NOVOLOG) 100 UNIT/ML FlexPen Inject 20 units with breakfast and lunch, and 25 units before supper daily, subcutaneous  15 pen  3  . insulin glargine (LANTUS) 100 unit/mL SOPN Inject 0.65 mLs (65 Units total) into the skin at bedtime.  15 pen  3  . Neomycin-Bacitracin-Polymyxin (NEOSPORIN EX) Apply 1 application topically daily as needed (for bites).      Marland Kitchen oxyCODONE-acetaminophen (PERCOCET)  5-325 MG per tablet Take 1-2 tablets by mouth every 6 (six) hours as needed for severe pain.  10 tablet  0   No current facility-administered medications on file prior to visit.   Family History  Problem Relation Age of Onset  . Diabetes Mother   . Hypertension Mother   . Heart disease Mother   . Diabetes Father   . Heart disease Father   . Stroke Maternal Grandmother   . Cancer Maternal Grandmother    History   Social History  . Marital Status: Single    Spouse Name: N/A    Number of Children: N/A  . Years of Education: N/A   Occupational History  . Not on file.   Social History Main Topics  . Smoking status: Never Smoker   . Smokeless tobacco: Not on file  . Alcohol Use: No  . Drug Use:   . Sexual Activity:    Other Topics Concern  . Not on file   Social History Narrative  . No narrative on file    Review of Systems: Constitutional: Negative for fever, chills, diaphoresis, activity change, appetite change and fatigue. HENT: Negative for ear pain, nosebleeds, congestion, facial swelling, rhinorrhea, neck pain, neck stiffness and ear discharge.  Eyes: Negative for pain, discharge, redness, itching and visual disturbance. Respiratory: Negative for cough, choking, chest tightness, shortness of breath, wheezing and stridor.  Cardiovascular: Negative for chest pain, palpitations and leg swelling.  Gastrointestinal: Negative for abdominal distention. Genitourinary: Negative for dysuria, urgency, frequency, hematuria, flank pain, decreased urine volume, difficulty urinating and dyspareunia.  Musculoskeletal: Negative for back pain, joint swelling, arthralgia and gait problem. Neurological: Negative for dizziness, tremors, seizures, syncope, facial asymmetry, speech difficulty, weakness, light-headedness, numbness and headaches.  Hematological: Negative for adenopathy. Does not bruise/bleed easily. Psychiatric/Behavioral: Negative for hallucinations, behavioral problems,  confusion, dysphoric mood, decreased concentration and agitation.    Objective:   Filed Vitals:   07/16/14 1004  BP: 143/89  Pulse: 77  Temp: 98 F (36.7 C)  Resp: 16    Physical Exam: Constitutional: Patient appears well-developed and well-nourished. No distress. HENT: Normocephalic, atraumatic, External right and left ear normal. Oropharynx is clear and moist.  Eyes: Conjunctivae and EOM are normal. PERRLA, no scleral icterus. Neck: Normal ROM. Neck supple. No JVD. No tracheal deviation. No thyromegaly. CVS: RRR, S1/S2 +, no murmurs, no gallops, no carotid bruit.  Pulmonary: Effort and breath sounds normal, no stridor, rhonchi, wheezes, rales.  Abdominal: Soft. BS +, no distension, tenderness, rebound or guarding.  Neuro: Alert. Normal reflexes, muscle tone coordination. No cranial nerve deficit. Skin: ; left upper extremity Skin is warm minimal tenderness to touch minimal yellowish discharge. Psychiatric: Normal mood and affect. Behavior, judgment, thought content normal.  Lab Results  Component Value Date   WBC 8.3 07/11/2014   HGB 11.8* 07/11/2014   HCT 36.4 07/11/2014   MCV 79.3 07/11/2014   PLT 231 07/11/2014   Lab Results  Component Value Date   CREATININE 0.54 07/11/2014   BUN 9 07/11/2014   NA 133* 07/11/2014   K 4.1 07/11/2014   CL 95* 07/11/2014   CO2 23 07/11/2014    Lab Results  Component Value Date   HGBA1C 12.7* 06/12/2011   Lipid Panel     Component Value Date/Time   CHOL 591* 06/12/2011 0630   TRIG 132 06/12/2011 0630   HDL 11* 06/12/2011 0630   CHOLHDL 53.7 06/12/2011 0630   VLDL 26 06/12/2011 0630   LDLCALC 554* 06/12/2011 0630       Assessment and plan:   1. Diabetes mellitus due to underlying condition without complications  Results for orders placed in visit on 07/16/14  GLUCOSE, POCT (MANUAL RESULT ENTRY)      Result Value Ref Range   POC Glucose 263 (*) 70 - 99 mg/dl  GLUCOSE, POCT (MANUAL RESULT ENTRY)      Result Value Ref Range   POC  Glucose 265 (*) 70 - 99 mg/dl   Uncontrolled patient was off the medication, she'll resume back on Lantus NovoLog and I have given her prescription for metformin, advise patient to keep the fingerstick log, she'll come back in 2 weeks for the nurse visit. - Glucose (CBG) - insulin aspart (novoLOG) injection 10 Units; Inject 0.1 mLs (10 Units total) into the skin once. - metFORMIN (GLUCOPHAGE) 1000 MG tablet; Take 1 tablet (1,000 mg total) by mouth 2 (two) times daily with a meal.  Dispense: 180 tablet; Refill: 3 Have ordered fasting blood work. - TSH - Lipid panel - Vit D  25 hydroxy (rtn osteoporosis monitoring) - CBC with Differential - Hemoglobin A1c - glucose monitoring kit (FREESTYLE) monitoring kit; 1 each by Does not apply route 4 (four) times daily - after meals and at bedtime. 1 month Diabetic Testing Supplies for QAC-QHS accuchecks.  Dispense: 1 each; Refill: 1  2. Elevated BP  dash diet  3. Obesity, unspecified Diet and exercise   4. Cellulitis of  left upper extremity  - cephALEXin (KEFLEX) 500 MG capsule; Take 1 capsule (500 mg total) by mouth 4 (four) times daily.  Dispense: 40 capsule; Refill: 0     Return in about 3 months (around 10/16/2014) for diabetes, CBG in 2 weeks/Nurse Visit.   Lorayne Marek, MD

## 2014-07-16 NOTE — Progress Notes (Signed)
Patient here for follow up from hospital Has a spider bite to her left shoulder that is still draining Blood sugar is elevated as well

## 2014-07-17 LAB — VITAMIN D 25 HYDROXY (VIT D DEFICIENCY, FRACTURES): Vit D, 25-Hydroxy: 25 ng/mL — ABNORMAL LOW (ref 30–89)

## 2014-07-18 ENCOUNTER — Telehealth: Payer: Self-pay

## 2014-07-18 MED ORDER — VITAMIN D (ERGOCALCIFEROL) 1.25 MG (50000 UNIT) PO CAPS: 50000.0000 [IU] | ORAL_CAPSULE | ORAL | Status: DC

## 2014-07-18 NOTE — Telephone Encounter (Signed)
Patient not available Left message on voice mail to return our call 

## 2014-07-18 NOTE — Telephone Encounter (Signed)
Message copied by Lestine Mount on Wed Jul 18, 2014 10:39 AM ------      Message from: Doris Cheadle      Created: Tue Jul 17, 2014  9:15 AM       Blood work reviewed, noticed low vitamin D, call patient advise to start ergocalciferol 50,000 units once a week for the duration of  12 weeks.      Diabetes is uncontrolled secondary to patient being off medications for  few months, advise patient to take medications regularly also advise for low carbohydrate diet. ------

## 2014-07-26 ENCOUNTER — Telehealth: Payer: Self-pay | Admitting: Internal Medicine

## 2014-07-26 NOTE — Telephone Encounter (Signed)
Pt. Calling to get a refill on medication. Pharmacy faxed over request but has not received anything back. Please f/u with pt.

## 2014-07-27 ENCOUNTER — Telehealth: Payer: Self-pay | Admitting: *Deleted

## 2014-07-27 DIAGNOSIS — E089 Diabetes mellitus due to underlying condition without complications: Secondary | ICD-10-CM

## 2014-07-27 MED ORDER — ACCU-CHEK SOFTCLIX LANCETS MISC: Status: DC

## 2014-07-27 MED ORDER — GLUCOSE BLOOD VI STRP: ORAL_STRIP | Status: DC

## 2014-07-27 MED ORDER — ACCU-CHEK AVIVA PLUS W/DEVICE KIT: 1.0000 | PACK | Freq: Four times a day (QID) | Status: DC

## 2014-07-27 NOTE — Telephone Encounter (Signed)
Faxed received from [facility] pharmacy stating patient needs an AccuChek Aviva Plus Meter with lancets and strips. Patient insurance will not pay for the Freestyle meter. Prescription sent to [facility] pharmacy. Annamaria Helling, RN

## 2014-07-30 ENCOUNTER — Ambulatory Visit: Payer: Medicaid Other | Attending: Internal Medicine | Admitting: *Deleted

## 2014-07-30 ENCOUNTER — Telehealth: Payer: Self-pay | Admitting: Internal Medicine

## 2014-07-30 DIAGNOSIS — E139 Other specified diabetes mellitus without complications: Secondary | ICD-10-CM

## 2014-07-30 DIAGNOSIS — E089 Diabetes mellitus due to underlying condition without complications: Secondary | ICD-10-CM

## 2014-07-30 LAB — GLUCOSE, POCT (MANUAL RESULT ENTRY): POC Glucose: 178 mg/dl — AB (ref 70–99)

## 2014-07-30 NOTE — Telephone Encounter (Signed)
Pt calling for A1C test results, forgot to ask during nurse visit this morning. Please f/u with pt.

## 2014-07-30 NOTE — Patient Instructions (Signed)
Diabetes Insipidus Diabetes insipidus is a rare condition that causes the body to create more urine than normal, which leads to thirst and dehydration. The urine is made mostly of water (dilute urine). There are several types of diabetes insipidus. The most common forms are related to decreased production or resistance to the hormone that regulates urine production (antidiuretic hormone). Diabetes insipidus can be managed and is not usually serious. Diabetes insipidus is not the same as diabetes mellitus, which is the more common condition that causes high blood sugar. CAUSES   Brain tumor.  Brain surgery.  Brain trauma.  Genetics.  Cancer.  Hypoxic encephalopathy.  Genetic immune system disease.  Anorexia nervosa.  Vascular abnormalities and conditions.  Lithium toxicity.  High concentrations of calcium.  Low potassium.  Kidney disease.  Pregnancy.  Low blood sugar.  Certain medicines. SIGNS AND SYMPTOMS  Excessive urination. Excessive urination means urinating 10.4-12.5 cups (2.5-3 L) in 24 hours.  Excessive thirst.  Frequent nighttime urination.  Vomiting.  Diarrhea. DIAGNOSIS  Diabetes insipidus may be diagnosed with:  A physical exam.  Blood tests.  Urine tests. TREATMENT  Once the specific type of diabetes insipidus is identified, treatment may include one or more of the following:  Increasing your water intake.  Medicines.  Changing your diet. You may be put on a low-protein or low-sodium diet. You may need to see your health care provider regularly. HOME CARE INSTRUCTIONS  Drink water and fluids as directed by your health care provider.  Follow your health care provider's diet instructions if they were given.  Take medicines as directed by your health care provider.  Monitor your risk of dehydration in extreme heat if directed by your health care provider.  Keep all follow-up appointments with your health care provider.  Make an  appointment with your health care provider to find out the results of your tests if the results were not back during your visit. Do not assume everything is normal if you have not heard from your health care provider or the medical facility. SEEK MEDICAL CARE IF: Your symptoms continue even after treatment. Document Released: 12/12/2013 Document Reviewed: 12/12/2013 [facility] Patient Information 2015 [place], [place]. This information is not intended to replace advice given to you by your health care provider. Make sure you discuss any questions you have with your health care provider.

## 2014-07-30 NOTE — Progress Notes (Signed)
Patient here for CBG check. Patient voiced concerns about not having a meter. Informed patient prescription was sent on 07/27/2014 for meter, test strips, and lancets. Patient verbalized understanding and said she will check with her pharmacy.

## 2014-08-09 ENCOUNTER — Telehealth: Payer: Self-pay

## 2014-08-09 NOTE — Telephone Encounter (Signed)
Returned patient phone call Patient not available Left message on voice mail to return our call 

## 2014-09-20 ENCOUNTER — Ambulatory Visit: Payer: Medicaid Other | Admitting: Endocrinology

## 2014-09-25 ENCOUNTER — Ambulatory Visit: Payer: Medicaid Other | Admitting: Endocrinology

## 2014-09-25 ENCOUNTER — Encounter: Payer: Self-pay | Admitting: *Deleted

## 2014-09-25 ENCOUNTER — Telehealth: Payer: Self-pay | Admitting: Endocrinology

## 2014-09-25 NOTE — Telephone Encounter (Signed)
No need to create an encounter for new patients. Need to notify the referring physician that patient did not show up. In this case may ask the patient if she is interested in coming back and will need to make sure she does keep the appointment

## 2014-09-25 NOTE — Telephone Encounter (Signed)
Patient no showed today's appt. Please advise on how to follow up. °A. No follow up necessary. °B. Follow up urgent. Contact patient immediately. °C. Follow up necessary. Contact patient and schedule visit in ___ days. °D. Follow up advised. Contact patient and schedule visit in ____weeks. ° °

## 2014-10-10 ENCOUNTER — Encounter (HOSPITAL_COMMUNITY): Payer: Self-pay | Admitting: Emergency Medicine

## 2014-10-10 ENCOUNTER — Emergency Department (HOSPITAL_COMMUNITY)
Admission: EM | Admit: 2014-10-10 | Discharge: 2014-10-10 | Disposition: A | Discharge status: Home / Self Care | Payer: Medicaid Other | Attending: Emergency Medicine | Admitting: Emergency Medicine

## 2014-10-10 DIAGNOSIS — Z794 Long term (current) use of insulin: Secondary | ICD-10-CM | POA: Diagnosis not present

## 2014-10-10 DIAGNOSIS — Z792 Long term (current) use of antibiotics: Secondary | ICD-10-CM | POA: Insufficient documentation

## 2014-10-10 DIAGNOSIS — Z7982 Long term (current) use of aspirin: Secondary | ICD-10-CM | POA: Diagnosis not present

## 2014-10-10 DIAGNOSIS — L0291 Cutaneous abscess, unspecified: Secondary | ICD-10-CM

## 2014-10-10 DIAGNOSIS — J45909 Unspecified asthma, uncomplicated: Secondary | ICD-10-CM | POA: Insufficient documentation

## 2014-10-10 DIAGNOSIS — L02411 Cutaneous abscess of right axilla: Secondary | ICD-10-CM | POA: Insufficient documentation

## 2014-10-10 DIAGNOSIS — I10 Essential (primary) hypertension: Secondary | ICD-10-CM | POA: Diagnosis not present

## 2014-10-10 DIAGNOSIS — E669 Obesity, unspecified: Secondary | ICD-10-CM | POA: Insufficient documentation

## 2014-10-10 DIAGNOSIS — E119 Type 2 diabetes mellitus without complications: Secondary | ICD-10-CM | POA: Diagnosis not present

## 2014-10-10 DIAGNOSIS — Z79899 Other long term (current) drug therapy: Secondary | ICD-10-CM | POA: Insufficient documentation

## 2014-10-10 DIAGNOSIS — L259 Unspecified contact dermatitis, unspecified cause: Secondary | ICD-10-CM

## 2014-10-10 DIAGNOSIS — R21 Rash and other nonspecific skin eruption: Secondary | ICD-10-CM | POA: Diagnosis present

## 2014-10-10 HISTORY — DX: Obesity, unspecified: E66.9

## 2014-10-10 MED ORDER — HYDROCORTISONE 1 % EX CREA: TOPICAL_CREAM | CUTANEOUS | Status: DC

## 2014-10-10 MED ORDER — SULFAMETHOXAZOLE-TRIMETHOPRIM 800-160 MG PO TABS: 1.0000 | ORAL_TABLET | Freq: Two times a day (BID) | ORAL | Status: DC

## 2014-10-10 MED ORDER — LIDOCAINE-EPINEPHRINE (PF) 2 %-1:200000 IJ SOLN
20.0000 mL | Freq: Once | INTRAMUSCULAR | Status: AC
  Administered 2014-10-10: 20 mL

## 2014-10-10 MED ORDER — CEPHALEXIN 500 MG PO CAPS: 500.0000 mg | ORAL_CAPSULE | Freq: Four times a day (QID) | ORAL | Status: DC

## 2014-10-10 MED ORDER — HYDROCODONE-ACETAMINOPHEN 5-325 MG PO TABS: 1.0000 | ORAL_TABLET | Freq: Four times a day (QID) | ORAL | Status: DC | PRN

## 2014-10-10 NOTE — ED Notes (Signed)
Pt. reports bilateral itchy /dry irritated skin rashes at bilateral axilla onset Friday after switching deodorant .

## 2014-10-10 NOTE — Discharge Instructions (Signed)
Abscess An abscess is an infected area that contains a collection of pus and debris.It can occur in almost any part of the body. An abscess is also known as a furuncle or boil. CAUSES  An abscess occurs when tissue gets infected. This can occur from blockage of oil or sweat glands, infection of hair follicles, or a minor injury to the skin. As the body tries to fight the infection, pus collects in the area and creates pressure under the skin. This pressure causes pain. People with weakened immune systems have difficulty fighting infections and get certain abscesses more often.  SYMPTOMS Usually an abscess develops on the skin and becomes a painful mass that is red, warm, and tender. If the abscess forms under the skin, you may feel a moveable soft area under the skin. Some abscesses break open (rupture) on their own, but most will continue to get worse without care. The infection can spread deeper into the body and eventually into the bloodstream, causing you to feel ill.  DIAGNOSIS  Your caregiver will take your medical history and perform a physical exam. A sample of fluid may also be taken from the abscess to determine what is causing your infection. TREATMENT  Your caregiver may prescribe antibiotic medicines to fight the infection. However, taking antibiotics alone usually does not cure an abscess. Your caregiver may need to make a small cut (incision) in the abscess to drain the pus. In some cases, gauze is packed into the abscess to reduce pain and to continue draining the area. HOME CARE INSTRUCTIONS   Only take over-the-counter or prescription medicines for pain, discomfort, or fever as directed by your caregiver.  If you were prescribed antibiotics, take them as directed. Finish them even if you start to feel better.  If gauze is used, follow your caregiver's directions for changing the gauze.  To avoid spreading the infection:  Keep your draining abscess covered with a  bandage.  Wash your hands well.  Do not share personal care items, towels, or whirlpools with others.  Avoid skin contact with others.  Keep your skin and clothes clean around the abscess.  Keep all follow-up appointments as directed by your caregiver. SEEK MEDICAL CARE IF:   You have increased pain, swelling, redness, fluid drainage, or bleeding.  You have muscle aches, chills, or a general ill feeling.  You have a fever. MAKE SURE YOU:   Understand these instructions.  Will watch your condition.  Will get help right away if you are not doing well or get worse. Document Released: 09/16/2005 Document Revised: 06/07/2012 Document Reviewed: 02/19/2012 [facility] Patient Information 2015 [place], [place]. This information is not intended to replace advice given to you by your health care provider. Make sure you discuss any questions you have with your health care provider.   Contact Dermatitis Contact dermatitis is a reaction to certain substances that touch the skin. Contact dermatitis can be either irritant contact dermatitis or allergic contact dermatitis. Irritant contact dermatitis does not require previous exposure to the substance for a reaction to occur.Allergic contact dermatitis only occurs if you have been exposed to the substance before. Upon a repeat exposure, your body reacts to the substance.  CAUSES  Many substances can cause contact dermatitis. Irritant dermatitis is most commonly caused by repeated exposure to mildly irritating substances, such as:  Makeup.  Soaps.  Detergents.  Bleaches.  Acids.  Metal salts, such as nickel. Allergic contact dermatitis is most commonly caused by exposure to:  Poisonous plants.  Chemicals (deodorants, shampoos).  Jewelry.  Latex.  Neomycin in triple antibiotic cream.  Preservatives in products, including clothing. SYMPTOMS  The area of skin that is exposed may develop:  Dryness or  flaking.  Redness.  Cracks.  Itching.  Pain or a burning sensation.  Blisters. With allergic contact dermatitis, there may also be swelling in areas such as the eyelids, mouth, or genitals.  DIAGNOSIS  Your caregiver can usually tell what the problem is by doing a physical exam. In cases where the cause is uncertain and an allergic contact dermatitis is suspected, a patch skin test may be performed to help determine the cause of your dermatitis. TREATMENT Treatment includes protecting the skin from further contact with the irritating substance by avoiding that substance if possible. Barrier creams, powders, and gloves may be helpful. Your caregiver may also recommend:  Steroid creams or ointments applied 2 times daily. For best results, soak the rash area in cool water for 20 minutes. Then apply the medicine. Cover the area with a plastic wrap. You can store the steroid cream in the refrigerator for a "chilly" effect on your rash. That may decrease itching. Oral steroid medicines may be needed in more severe cases.  Antibiotics or antibacterial ointments if a skin infection is present.  Antihistamine lotion or an antihistamine taken by mouth to ease itching.  Lubricants to keep moisture in your skin.  Burow's solution to reduce redness and soreness or to dry a weeping rash. Mix one packet or tablet of solution in 2 cups cool water. Dip a clean washcloth in the mixture, wring it out a bit, and put it on the affected area. Leave the cloth in place for 30 minutes. Do this as often as possible throughout the day.  Taking several cornstarch or baking soda baths daily if the area is too large to cover with a washcloth. Harsh chemicals, such as alkalis or acids, can cause skin damage that is like a burn. You should flush your skin for 15 to 20 minutes with cold water after such an exposure. You should also seek immediate medical care after exposure. Bandages (dressings), antibiotics, and pain  medicine may be needed for severely irritated skin.  HOME CARE INSTRUCTIONS  Avoid the substance that caused your reaction.  Keep the area of skin that is affected away from hot water, soap, sunlight, chemicals, acidic substances, or anything else that would irritate your skin.  Do not scratch the rash. Scratching may cause the rash to become infected.  You may take cool baths to help stop the itching.  Only take over-the-counter or prescription medicines as directed by your caregiver.  See your caregiver for follow-up care as directed to make sure your skin is healing properly. SEEK MEDICAL CARE IF:   Your condition is not better after 3 days of treatment.  You seem to be getting worse.  You see signs of infection such as swelling, tenderness, redness, soreness, or warmth in the affected area.  You have any problems related to your medicines. Document Released: 12/04/2000 Document Revised: 02/29/2012 Document Reviewed: 05/12/2011 [facility] Patient Information 2015 [employer], [place]. This information is not intended to replace advice given to you by your health care provider. Make sure you discuss any questions you have with your health care provider.

## 2014-10-10 NOTE — ED Provider Notes (Signed)
CSN: [id number]     Arrival date & time 10/10/14  0016 History   First MD Initiated Contact with Patient 10/10/14 0048     Chief Complaint  Patient presents with  . Rash     (Consider location/radiation/quality/duration/timing/severity/associated sxs/prior Treatment) HPI Lindsay [name] is a 17 yof with pmhx of DM, HTN, Asthma, Obesity who presents to the ER with 5 days of pruritis in bilateral axillae.  Pt states she switched deodorant to a new type on Friday, and since then has experienced severe pruritis in her axillae.  Pt states she stopped using the deodorant after the first day, however has still been experiencing pruritis.  Pt states she has tried Benadryl with mild relief.  Pt states she noticed swelling to her R axilla x 2 days after scratching it.  Pt denies having any fever, nausea, vomiting, abdominal pain, chest pain, shortness of breath, hoarseness.    Past Medical History  Diagnosis Date  . Hypertension   . Diabetes mellitus   . Asthma   . Obesity    Past Surgical History  Procedure Laterality Date  . Cesarean section    . Tonsillectomy    . Leg surgery     Family History  Problem Relation Age of Onset  . Diabetes Mother   . Hypertension Mother   . Heart disease Mother   . Diabetes Father   . Heart disease Father   . Stroke Maternal Grandmother   . Cancer Maternal Grandmother    History  Substance Use Topics  . Smoking status: Never Smoker   . Smokeless tobacco: Not on file  . Alcohol Use: No   OB History   Grav Para Term Preterm Abortions TAB SAB Ect Mult Living                 Review of Systems  Constitutional: Negative for fever and chills.  Skin: Positive for rash and wound.  Neurological: Negative for numbness.      Allergies  Review of patient's allergies indicates no known allergies.  Home Medications   Prior to Admission medications   Medication Sig Start Date End Date Taking? Authorizing Provider  ACCU-CHEK SOFTCLIX LANCETS lancets  Diagnosis 249.00. Test blood sugar three times a day and at bedtime 07/27/14   Lorayne Marek, MD  acetaminophen (TYLENOL) 500 MG tablet Take 1,000 mg by mouth every 6 (six) hours as needed for moderate pain.    Historical Provider, MD  aspirin 81 MG tablet Take 81 mg by mouth daily.    Historical Provider, MD  Blood Glucose Monitoring Suppl (ACCU-CHEK AVIVA PLUS) W/DEVICE KIT 1 kit by Does not apply route 4 (four) times daily. Diagnosis 249.00. Test blood sugar three times a day and at bedtime. 07/27/14   Lorayne Marek, MD  cephALEXin (KEFLEX) 500 MG capsule Take 1 capsule (500 mg total) by mouth 4 (four) times daily. 07/16/14   Lorayne Marek, MD  cephALEXin (KEFLEX) 500 MG capsule Take 1 capsule (500 mg total) by mouth 4 (four) times daily. 10/10/14   Carrie Mew, PA-C  glucose blood (ACCU-CHEK AVIVA PLUS) test strip Diagnosis 249.00. Test blood sugar three times a day and at bedtime 07/27/14   Lorayne Marek, MD  HYDROcodone-acetaminophen (NORCO/VICODIN) 5-325 MG per tablet Take 1-2 tablets by mouth every 6 (six) hours as needed for moderate pain or severe pain. 10/10/14   Carrie Mew, PA-C  hydrocortisone cream 1 % Apply to affected area 2 times daily 10/10/14   Carrie Mew,  PA-C  insulin aspart (NOVOLOG) 100 UNIT/ML FlexPen Inject 20 units with breakfast and lunch, and 25 units before supper daily, subcutaneous 07/11/14   Mercedes Strupp Camprubi-Soms, PA-C  insulin glargine (LANTUS) 100 unit/mL SOPN Inject 0.65 mLs (65 Units total) into the skin at bedtime. 07/11/14   Mercedes Strupp Camprubi-Soms, PA-C  metFORMIN (GLUCOPHAGE) 1000 MG tablet Take 1 tablet (1,000 mg total) by mouth 2 (two) times daily with a meal. 07/16/14   Lorayne Marek, MD  Neomycin-Bacitracin-Polymyxin (NEOSPORIN EX) Apply 1 application topically daily as needed (for bites).    Historical Provider, MD  oxyCODONE-acetaminophen (PERCOCET) 5-325 MG per tablet Take 1-2 tablets by mouth every 6 (six) hours as needed for severe pain.  07/11/14   Mercedes Strupp Camprubi-Soms, PA-C  sulfamethoxazole-trimethoprim (BACTRIM DS,SEPTRA DS) 800-160 MG per tablet Take 1 tablet by mouth 2 (two) times daily. 10/10/14   Carrie Mew, PA-C  Vitamin D, Ergocalciferol, (DRISDOL) 50000 UNITS CAPS capsule Take 1 capsule (50,000 Units total) by mouth every 7 (seven) days. 07/18/14   Lorayne Marek, MD   BP 162/99  Pulse 92  Temp(Src) 98.3 F (36.8 C) (Oral)  Resp 18  Ht 5' 7"  (1.702 m)  Wt 335 lb (151.955 kg)  BMI 52.46 kg/m2  SpO2 98%  LMP 09/17/2014 Physical Exam  Nursing note and vitals reviewed. Constitutional: She is oriented to person, place, and time. She appears well-developed and well-nourished. No distress.  HENT:  Head: Normocephalic and atraumatic.  Eyes: Right eye exhibits no discharge. Left eye exhibits no discharge. No scleral icterus.  Neck: Normal range of motion.  Pulmonary/Chest: Effort normal. No respiratory distress.  Musculoskeletal: Normal range of motion.  Neurological: She is alert and oriented to person, place, and time.  Skin: Skin is warm and dry. She is not diaphoretic.  4-5 cm lesion of induration noted with scant purulent discharge from two sites noted.  Abscess is indurated and mobile, without obvious fluctuance. No surrounding erythema, swelling. Induration does not extend beyond 4-5 cm diameter, and does not exceed the borders of the axilla.   Psychiatric: She has a normal mood and affect.    ED Course  INCISION AND DRAINAGE Date/Time: 10/10/2014 1:40 AM Performed by: Carrie Mew Authorized by: Carrie Mew Consent: Verbal consent obtained. Risks and benefits: risks, benefits and alternatives were discussed Consent given by: patient Patient identity confirmed: verbally with patient Type: abscess Body area: upper extremity Anesthesia: local infiltration Local anesthetic: lidocaine 2% with epinephrine Anesthetic total: 4 ml Patient sedated: no Scalpel size: 11 Incision type: single  straight Complexity: simple Drainage: purulent Drainage amount: moderate Wound treatment: wound left open Packing material: 1/2 in iodoform gauze Patient tolerance: Patient tolerated the procedure well with no immediate complications.   (including critical care time) Labs Review Labs Reviewed - No data to display  Imaging Review No results found.   EKG Interpretation None      MDM   Final diagnoses:  Abscess  Contact dermatitis    Patient has 5 days of pruritus to bilateral axillae after using a new deodorant on Friday. Rash and mild lichenification consistent with a contact dermatitis from the new deodorant. Plan to prescribe hydrocortisone cream to help relieve pruritus due to patient still experiencing some, which is unrelieved with Benadryl. Also noted in patient's right axilla is abscess amenable to incision and drainage. Mild to moderate amount of drainage noted as in procedure note above. Patient encouraged to return to the ER for wound recheck in 2 days, and  for packing removal. Patient placed on antibiotics due to to patient's being a diabetic. Encouraged home warm soaks and flushing.  Mild signs of cellulitis is surrounding skin.  Will d/c to home. Patient afebrile, no tachycardia, no acute distress while in the ED. Patient is agreeable to this plan. I discussed return precautions with patient, and encourage her to call or return to the ER should her symptoms worsen, persist or should she develop any questions or concerns.  BP 162/99  Pulse 92  Temp(Src) 98.3 F (36.8 C) (Oral)  Resp 18  Ht 5' 7"  (1.702 m)  Wt 335 lb (151.955 kg)  BMI 52.46 kg/m2  SpO2 98%  LMP 09/17/2014  Signed,  Dahlia Bailiff, PA-C 2:27 AM    Carrie Mew, PA-C 10/10/14 [phone]

## 2014-10-11 NOTE — ED Provider Notes (Signed)
Medical screening examination/treatment/procedure(s) were performed by non-physician practitioner and as supervising physician I was immediately available for consultation/collaboration.   EKG Interpretation None        Mirian Mo, MD 10/11/14 1102

## 2014-10-17 ENCOUNTER — Ambulatory Visit: Payer: Medicaid Other | Admitting: Internal Medicine

## 2014-12-14 ENCOUNTER — Emergency Department (HOSPITAL_COMMUNITY): Payer: Medicaid Other

## 2014-12-14 ENCOUNTER — Emergency Department (HOSPITAL_COMMUNITY)
Admission: EM | Admit: 2014-12-14 | Discharge: 2014-12-14 | Disposition: A | Discharge status: Home / Self Care | Payer: Medicaid Other | Attending: Emergency Medicine | Admitting: Emergency Medicine

## 2014-12-14 ENCOUNTER — Encounter (HOSPITAL_COMMUNITY): Payer: Self-pay | Admitting: Emergency Medicine

## 2014-12-14 DIAGNOSIS — E089 Diabetes mellitus due to underlying condition without complications: Secondary | ICD-10-CM

## 2014-12-14 DIAGNOSIS — I10 Essential (primary) hypertension: Secondary | ICD-10-CM | POA: Diagnosis not present

## 2014-12-14 DIAGNOSIS — L03312 Cellulitis of back [any part except buttock]: Secondary | ICD-10-CM | POA: Diagnosis not present

## 2014-12-14 DIAGNOSIS — Z794 Long term (current) use of insulin: Secondary | ICD-10-CM | POA: Insufficient documentation

## 2014-12-14 DIAGNOSIS — R630 Anorexia: Secondary | ICD-10-CM | POA: Insufficient documentation

## 2014-12-14 DIAGNOSIS — Z7982 Long term (current) use of aspirin: Secondary | ICD-10-CM | POA: Diagnosis not present

## 2014-12-14 DIAGNOSIS — R509 Fever, unspecified: Secondary | ICD-10-CM

## 2014-12-14 DIAGNOSIS — E1165 Type 2 diabetes mellitus with hyperglycemia: Secondary | ICD-10-CM | POA: Diagnosis not present

## 2014-12-14 DIAGNOSIS — E669 Obesity, unspecified: Secondary | ICD-10-CM | POA: Diagnosis not present

## 2014-12-14 DIAGNOSIS — R739 Hyperglycemia, unspecified: Secondary | ICD-10-CM

## 2014-12-14 DIAGNOSIS — J45909 Unspecified asthma, uncomplicated: Secondary | ICD-10-CM | POA: Insufficient documentation

## 2014-12-14 DIAGNOSIS — R000 Tachycardia, unspecified: Secondary | ICD-10-CM | POA: Insufficient documentation

## 2014-12-14 DIAGNOSIS — L02212 Cutaneous abscess of back [any part, except buttock]: Secondary | ICD-10-CM | POA: Diagnosis not present

## 2014-12-14 DIAGNOSIS — Z79899 Other long term (current) drug therapy: Secondary | ICD-10-CM | POA: Insufficient documentation

## 2014-12-14 DIAGNOSIS — L0390 Cellulitis, unspecified: Secondary | ICD-10-CM

## 2014-12-14 DIAGNOSIS — L0291 Cutaneous abscess, unspecified: Secondary | ICD-10-CM

## 2014-12-14 LAB — CBC WITH DIFFERENTIAL/PLATELET
Basophils Absolute: 0 10*3/uL (ref 0.0–0.1)
Basophils Relative: 0 % (ref 0–1)
EOS PCT: 1 % (ref 0–5)
Eosinophils Absolute: 0.1 10*3/uL (ref 0.0–0.7)
HEMATOCRIT: 38.5 % (ref 36.0–46.0)
HEMOGLOBIN: 12.5 g/dL (ref 12.0–15.0)
LYMPHS ABS: 2.3 10*3/uL (ref 0.7–4.0)
LYMPHS PCT: 17 % (ref 12–46)
MCH: 24.8 pg — ABNORMAL LOW (ref 26.0–34.0)
MCHC: 32.5 g/dL (ref 30.0–36.0)
MCV: 76.4 fL — AB (ref 78.0–100.0)
MONO ABS: 1 10*3/uL (ref 0.1–1.0)
MONOS PCT: 7 % (ref 3–12)
Neutro Abs: 10.5 10*3/uL — ABNORMAL HIGH (ref 1.7–7.7)
Neutrophils Relative %: 75 % (ref 43–77)
Platelets: 257 10*3/uL (ref 150–400)
RBC: 5.04 MIL/uL (ref 3.87–5.11)
RDW: 13.7 % (ref 11.5–15.5)
WBC: 14 10*3/uL — AB (ref 4.0–10.5)

## 2014-12-14 LAB — I-STAT VENOUS BLOOD GAS, ED
Acid-base deficit: 3 mmol/L — ABNORMAL HIGH (ref 0.0–2.0)
Bicarbonate: 18.8 mEq/L — ABNORMAL LOW (ref 20.0–24.0)
O2 SAT: 85 %
PCO2 VEN: 24.8 mmHg — AB (ref 45.0–50.0)
PH VEN: 7.489 — AB (ref 7.250–7.300)
TCO2: 20 mmol/L (ref 0–100)
pO2, Ven: 45 mmHg (ref 30.0–45.0)

## 2014-12-14 LAB — BASIC METABOLIC PANEL
Anion gap: 19 — ABNORMAL HIGH (ref 5–15)
Anion gap: 12 (ref 5–15)
BUN: <5 mg/dL — ABNORMAL LOW (ref 6–23)
BUN: 6 mg/dL (ref 6–23)
CALCIUM: 9.1 mg/dL (ref 8.4–10.5)
CO2: 19 mmol/L (ref 19–32)
CO2: 21 mmol/L (ref 19–32)
Calcium: 8.2 mg/dL — ABNORMAL LOW (ref 8.4–10.5)
Chloride: 96 mEq/L (ref 96–112)
Chloride: 98 mEq/L (ref 96–112)
Creatinine, Ser: 0.89 mg/dL (ref 0.50–1.10)
Creatinine, Ser: 0.79 mg/dL (ref 0.50–1.10)
GFR calc Af Amer: >90 mL/min (ref >90)
GFR calc Af Amer: >90 mL/min (ref >90)
GFR calc non Af Amer: 84 mL/min — ABNORMAL LOW (ref >90)
GFR calc non Af Amer: >90 mL/min (ref >90)
GLUCOSE: 317 mg/dL — AB (ref 70–99)
GLUCOSE: 298 mg/dL — AB (ref 70–99)
Potassium: 4.4 mmol/L (ref 3.5–5.1)
Potassium: 4 mmol/L (ref 3.5–5.1)
Sodium: 134 mmol/L — ABNORMAL LOW (ref 135–145)
Sodium: 131 mmol/L — ABNORMAL LOW (ref 135–145)

## 2014-12-14 LAB — URINALYSIS, ROUTINE W REFLEX MICROSCOPIC
Bilirubin Urine: NEGATIVE
Glucose, UA: >1000 mg/dL — AB
Ketones, ur: >80 mg/dL — AB
Leukocytes, UA: NEGATIVE
Nitrite: NEGATIVE
Protein, ur: >300 mg/dL — AB
SPECIFIC GRAVITY, URINE: 1.04 — AB (ref 1.005–1.030)
UROBILINOGEN UA: 1 mg/dL (ref 0.0–1.0)
pH: 6 (ref 5.0–8.0)

## 2014-12-14 LAB — CBG MONITORING, ED
GLUCOSE-CAPILLARY: 309 mg/dL — AB (ref 70–99)
Glucose-Capillary: 264 mg/dL — ABNORMAL HIGH (ref 70–99)

## 2014-12-14 LAB — URINE MICROSCOPIC-ADD ON

## 2014-12-14 LAB — I-STAT CG4 LACTIC ACID, ED
LACTIC ACID, VENOUS: 2 mmol/L (ref 0.5–2.2)
LACTIC ACID, VENOUS: 1.2 mmol/L (ref 0.5–2.2)

## 2014-12-14 MED ORDER — CLINDAMYCIN HCL 150 MG PO CAPS: 450.0000 mg | ORAL_CAPSULE | Freq: Three times a day (TID) | ORAL | Status: DC

## 2014-12-14 MED ORDER — ONDANSETRON HCL 4 MG/2ML IJ SOLN
4.0000 mg | Freq: Once | INTRAMUSCULAR | Status: AC
  Administered 2014-12-14: 4 mg via INTRAVENOUS
  Filled 2014-12-14: qty 2

## 2014-12-14 MED ORDER — INSULIN ASPART 100 UNIT/ML ~~LOC~~ SOLN
10.0000 [IU] | Freq: Once | SUBCUTANEOUS | Status: AC
  Administered 2014-12-14: 10 [IU] via SUBCUTANEOUS
  Filled 2014-12-14: qty 1

## 2014-12-14 MED ORDER — MORPHINE SULFATE 4 MG/ML IJ SOLN
4.0000 mg | Freq: Once | INTRAMUSCULAR | Status: AC
  Administered 2014-12-14: 4 mg via INTRAVENOUS
  Filled 2014-12-14: qty 1

## 2014-12-14 MED ORDER — OXYCODONE-ACETAMINOPHEN 5-325 MG PO TABS: 1.0000 | ORAL_TABLET | ORAL | Status: DC | PRN

## 2014-12-14 MED ORDER — CLINDAMYCIN PHOSPHATE 900 MG/50ML IV SOLN
900.0000 mg | Freq: Once | INTRAVENOUS | Status: AC
  Administered 2014-12-14: 900 mg via INTRAVENOUS
  Filled 2014-12-14: qty 50

## 2014-12-14 MED ORDER — MORPHINE SULFATE 4 MG/ML IJ SOLN
4.0000 mg | Freq: Once | INTRAMUSCULAR | Status: AC
Start: 2014-12-14 — End: 2014-12-14
  Administered 2014-12-14: 4 mg via INTRAVENOUS
  Filled 2014-12-14: qty 1

## 2014-12-14 MED ORDER — LIDOCAINE HCL (PF) 1 % IJ SOLN
30.0000 mL | Freq: Once | INTRAMUSCULAR | Status: AC
  Administered 2014-12-14: 30 mL
  Filled 2014-12-14: qty 30

## 2014-12-14 MED ORDER — INSULIN GLARGINE 100 UNITS/ML SOLOSTAR PEN: 65.0000 [IU] | PEN_INJECTOR | Freq: Every day | SUBCUTANEOUS | Status: DC

## 2014-12-14 MED ORDER — SODIUM CHLORIDE 0.9 % IV BOLUS (SEPSIS)
1000.0000 mL | INTRAVENOUS | Status: AC
  Administered 2014-12-14: 1000 mL via INTRAVENOUS

## 2014-12-14 MED ORDER — IBUPROFEN 800 MG PO TABS
800.0000 mg | ORAL_TABLET | Freq: Once | ORAL | Status: AC
  Administered 2014-12-14: 800 mg via ORAL
  Filled 2014-12-14: qty 1

## 2014-12-14 MED ORDER — INSULIN ASPART 100 UNIT/ML FLEXPEN: PEN_INJECTOR | SUBCUTANEOUS | Status: DC

## 2014-12-14 NOTE — ED Notes (Signed)
Pt c/o abscess to back x 4 days. Pt reports 12/12/2014 that she was having chills. Pt checked her temp today and it was 100.5. Pt c/o headache, nausea and decrease appetite.

## 2014-12-14 NOTE — Discharge Instructions (Signed)
1. Medications: clindamycin, percocet, usual home medications 2. Treatment: rest, drink plenty of fluids, please replace your glucometer and check your blood sugar as directed 3. Follow Up: Please followup with your primary doctor in 3 days for discussion of your diagnoses and further evaluation after today's visit; if you do not have a primary care doctor use the resource guide provided to find one; Please return to the ER in 2 days for wound check and packing removal, sooner if your fever gets worse, you become sicker or are vomiting,  Abscess An abscess is an infected area that contains a collection of pus and debris.It can occur in almost any part of the body. An abscess is also known as a furuncle or boil. CAUSES  An abscess occurs when tissue gets infected. This can occur from blockage of oil or sweat glands, infection of hair follicles, or a minor injury to the skin. As the body tries to fight the infection, pus collects in the area and creates pressure under the skin. This pressure causes pain. People with weakened immune systems have difficulty fighting infections and get certain abscesses more often.  SYMPTOMS Usually an abscess develops on the skin and becomes a painful mass that is red, warm, and tender. If the abscess forms under the skin, you may feel a moveable soft area under the skin. Some abscesses break open (rupture) on their own, but most will continue to get worse without care. The infection can spread deeper into the body and eventually into the bloodstream, causing you to feel ill.  DIAGNOSIS  Your caregiver will take your medical history and perform a physical exam. A sample of fluid may also be taken from the abscess to determine what is causing your infection. TREATMENT  Your caregiver may prescribe antibiotic medicines to fight the infection. However, taking antibiotics alone usually does not cure an abscess. Your caregiver may need to make a small cut (incision) in the  abscess to drain the pus. In some cases, gauze is packed into the abscess to reduce pain and to continue draining the area. HOME CARE INSTRUCTIONS   Only take over-the-counter or prescription medicines for pain, discomfort, or fever as directed by your caregiver.  If you were prescribed antibiotics, take them as directed. Finish them even if you start to feel better.  If gauze is used, follow your caregiver's directions for changing the gauze.  To avoid spreading the infection:  Keep your draining abscess covered with a bandage.  Wash your hands well.  Do not share personal care items, towels, or whirlpools with others.  Avoid skin contact with others.  Keep your skin and clothes clean around the abscess.  Keep all follow-up appointments as directed by your caregiver. SEEK MEDICAL CARE IF:   You have increased pain, swelling, redness, fluid drainage, or bleeding.  You have muscle aches, chills, or a general ill feeling.  You have a fever. MAKE SURE YOU:   Understand these instructions.  Will watch your condition.  Will get help right away if you are not doing well or get worse. Document Released: 09/16/2005 Document Revised: 06/07/2012 Document Reviewed: 02/19/2012 [facility] Patient Information 2015 [place], [place]. This information is not intended to replace advice given to you by your health care provider. Make sure you discuss any questions you have with your health care provider.

## 2014-12-14 NOTE — ED Provider Notes (Signed)
CSN: [id number]     Arrival date & time 12/14/14  1442 History   First MD Initiated Contact with Patient 12/14/14 1457     Chief Complaint  Patient presents with  . Abscess     (Consider location/radiation/quality/duration/timing/severity/associated sxs/prior Treatment) The history is provided by the patient and medical records. No language interpreter was used.     Lindsay [name] is a 33 y.o. female  with a hx of HTN, IDDM, asthma, obesity presents to the Emergency Department complaining of gradual, persistent, progressively worsening abscess to her left upper back onset 4 days with associated drainage beginning approximately 2 days ago. Patient reports that she has been having subjective chills for several days and has measured maximum temperature of 102.3 at home. Patient reports she has nausea and a generalized headache but has had no vomiting or diarrhea. Patient reports that she is an insulin-dependent diabetic but her glucometer broke it up in one month ago when she has not attempted to obtain a new one. She does not know what her blood sugar normally runs. Patient reports she sees [facility] but has not attempted to contact them about her blood sugars or this problem. Patient reports previous abscesses but denies requiring admission for IV antibiotics for this.  Patient reports generally feeling ill for the last several days. She is tearful on exam. Palpation aggravates the pain but there are no alleviating factors. Patient denies neck stiffness, neck pain, chest pain, shortness of breath, abdominal pain, vomiting, diarrhea, syncope, dysuria, hematuria.  Past Medical History  Diagnosis Date  . Hypertension   . Diabetes mellitus   . Asthma   . Obesity    Past Surgical History  Procedure Laterality Date  . Cesarean section    . Tonsillectomy    . Leg surgery     Family History  Problem Relation Age of Onset  . Diabetes Mother   . Hypertension Mother   . Heart  disease Mother   . Diabetes Father   . Heart disease Father   . Stroke Maternal Grandmother   . Cancer Maternal Grandmother    History  Substance Use Topics  . Smoking status: Never Smoker   . Smokeless tobacco: Not on file  . Alcohol Use: No   OB History    No data available     Review of Systems  Constitutional: Positive for fever, chills, appetite change (decreased) and fatigue. Negative for diaphoresis and unexpected weight change.  HENT: Negative for mouth sores.   Eyes: Negative for visual disturbance.  Respiratory: Negative for cough, chest tightness, shortness of breath and wheezing.   Cardiovascular: Negative for chest pain.  Gastrointestinal: Positive for nausea. Negative for vomiting, abdominal pain, diarrhea and constipation.  Endocrine: Positive for polydipsia and polyuria. Negative for polyphagia.  Genitourinary: Negative for dysuria, urgency, frequency and hematuria.  Musculoskeletal: Negative for back pain and neck stiffness.  Skin: Positive for wound.  Allergic/Immunologic: Negative for immunocompromised state.  Neurological: Positive for headaches. Negative for syncope and light-headedness.  Hematological: Does not bruise/bleed easily.  Psychiatric/Behavioral: Negative for sleep disturbance. The patient is not nervous/anxious.       Allergies  Review of patient's allergies indicates no known allergies.  Home Medications   Prior to Admission medications   Medication Sig Start Date End Date Taking? Authorizing Provider  acetaminophen (TYLENOL) 500 MG tablet Take 1,000 mg by mouth every 6 (six) hours as needed for moderate pain.   Yes Historical Provider, MD  aspirin 81  MG tablet Take 81 mg by mouth daily.   Yes Historical Provider, MD  hydrocortisone cream 1 % Apply to affected area 2 times daily 10/10/14  Yes Monte Fantasia, PA-C  metFORMIN (GLUCOPHAGE) 1000 MG tablet Take 1 tablet (1,000 mg total) by mouth 2 (two) times daily with a meal. 07/16/14  Yes  Deepak Advani, MD  Neomycin-Bacitracin-Polymyxin (NEOSPORIN EX) Apply 1 application topically daily as needed (for bites).   Yes Historical Provider, MD  Vitamin D, Ergocalciferol, (DRISDOL) 50000 UNITS CAPS capsule Take 1 capsule (50,000 Units total) by mouth every 7 (seven) days. 07/18/14  Yes Doris Cheadle, MD  cephALEXin (KEFLEX) 500 MG capsule Take 1 capsule (500 mg total) by mouth 4 (four) times daily. Patient not taking: Reported on 12/14/2014 07/16/14   Doris Cheadle, MD  cephALEXin (KEFLEX) 500 MG capsule Take 1 capsule (500 mg total) by mouth 4 (four) times daily. Patient not taking: Reported on 12/14/2014 10/10/14   Monte Fantasia, PA-C  clindamycin (CLEOCIN) 150 MG capsule Take 3 capsules (450 mg total) by mouth 3 (three) times daily. 12/14/14   Omauri Boeve, PA-C  HYDROcodone-acetaminophen (NORCO/VICODIN) 5-325 MG per tablet Take 1-2 tablets by mouth every 6 (six) hours as needed for moderate pain or severe pain. Patient not taking: Reported on 12/14/2014 10/10/14   Monte Fantasia, PA-C  insulin aspart (NOVOLOG) 100 UNIT/ML FlexPen Inject 20 units with breakfast and lunch, and 25 units before supper daily, subcutaneous 12/14/14   Fayrene Towner, PA-C  insulin glargine (LANTUS) 100 unit/mL SOPN Inject 0.65 mLs (65 Units total) into the skin at bedtime. 12/14/14   Rocket Gunderson, PA-C  oxyCODONE-acetaminophen (PERCOCET) 5-325 MG per tablet Take 1 tablet by mouth every 4 (four) hours as needed. 12/14/14   Kaylee Trivett, PA-C  sulfamethoxazole-trimethoprim (BACTRIM DS,SEPTRA DS) 800-160 MG per tablet Take 1 tablet by mouth 2 (two) times daily. Patient not taking: Reported on 12/14/2014 10/10/14   Monte Fantasia, PA-C   BP 135/86 mmHg  Pulse 98  Temp(Src) 102.1 F (38.9 C) (Rectal)  Resp 22  Ht 5\' 8"  (1.727 m)  Wt 348 lb (157.852 kg)  BMI 52.93 kg/m2  SpO2 96%  LMP 11/17/2014 Physical Exam  Constitutional: She is oriented to person, place, and time. She appears  well-developed and well-nourished. No distress.  Awake, alert, nontoxic appearance  HENT:  Head: Normocephalic and atraumatic.  Mouth/Throat: Oropharynx is clear and moist. No oropharyngeal exudate.  Eyes: Conjunctivae are normal. No scleral icterus.  Neck: Normal range of motion. Neck supple.  Cardiovascular: Regular rhythm, normal heart sounds and intact distal pulses.   tachycardia  Pulmonary/Chest: Effort normal and breath sounds normal. No respiratory distress. She has no wheezes.  Equal chest expansion  Abdominal: Soft. Bowel sounds are normal. She exhibits no distension and no mass. There is no tenderness. There is no rebound and no guarding.  Obese Soft and nontender  Musculoskeletal: Normal range of motion. She exhibits no edema.  Lymphadenopathy:    She has no cervical adenopathy.  Neurological: She is alert and oriented to person, place, and time. She exhibits normal muscle tone. Coordination normal.  Speech is clear and goal oriented Moves extremities without ataxia  Skin: Skin is warm and dry. She is not diaphoretic. There is erythema.  Large indurated and draining erythematous wound to the left upper back with significant TTP.    Psychiatric:  Pt crying throughout Hx and PE  Nursing note and vitals reviewed.   ED Course  INCISION AND DRAINAGE  Date/Time: 12/14/2014 6:15 PM Performed by: Dierdre Forth Authorized by: Dierdre Forth Consent: Verbal consent obtained. Risks and benefits: risks, benefits and alternatives were discussed Consent given by: patient Patient understanding: patient states understanding of the procedure being performed Patient consent: the patient's understanding of the procedure matches consent given Procedure consent: procedure consent matches procedure scheduled Relevant documents: relevant documents present and verified Site marked: the operative site was marked Required items: required blood products, implants, devices, and  special equipment available Patient identity confirmed: verbally with patient and arm band Time out: Immediately prior to procedure a "time out" was called to verify the correct patient, procedure, equipment, support staff and site/side marked as required. Type: abscess Body area: trunk Location details: back Anesthesia: local infiltration Local anesthetic: lidocaine 1% without epinephrine Anesthetic total: 6 ml Patient sedated: no Scalpel size: 11 Incision type: single straight Complexity: complex Drainage: purulent Drainage amount: copious Wound treatment: wound left open Packing material: 1/4 in gauze Patient tolerance: Patient tolerated the procedure well with no immediate complications   (including critical care time) Labs Review Labs Reviewed  CBC WITH DIFFERENTIAL - Abnormal; Notable for the following:    WBC 14.0 (*)    MCV 76.4 (*)    MCH 24.8 (*)    Neutro Abs 10.5 (*)    All other components within normal limits  BASIC METABOLIC PANEL - Abnormal; Notable for the following:    Sodium 134 (*)    Glucose, Bld 317 (*)    BUN <5 (*)    GFR calc non Af Amer 84 (*)    Anion gap 19 (*)    All other components within normal limits  URINALYSIS, ROUTINE W REFLEX MICROSCOPIC - Abnormal; Notable for the following:    Specific Gravity, Urine 1.040 (*)    Glucose, UA >1000 (*)    Hgb urine dipstick TRACE (*)    Ketones, ur >80 (*)    Protein, ur >300 (*)    All other components within normal limits  BASIC METABOLIC PANEL - Abnormal; Notable for the following:    Sodium 131 (*)    Glucose, Bld 298 (*)    Calcium 8.2 (*)    All other components within normal limits  CBG MONITORING, ED - Abnormal; Notable for the following:    Glucose-Capillary 309 (*)    All other components within normal limits  I-STAT VENOUS BLOOD GAS, ED - Abnormal; Notable for the following:    pH, Ven 7.489 (*)    pCO2, Ven 24.8 (*)    Bicarbonate 18.8 (*)    Acid-base deficit 3.0 (*)    All  other components within normal limits  CBG MONITORING, ED - Abnormal; Notable for the following:    Glucose-Capillary 264 (*)    All other components within normal limits  CULTURE, BLOOD (ROUTINE X 2)  CULTURE, BLOOD (ROUTINE X 2)  URINE CULTURE  URINE MICROSCOPIC-ADD ON  I-STAT CG4 LACTIC ACID, ED  I-STAT CG4 LACTIC ACID, ED    Imaging Review Dg Chest Port 1 View  12/14/2014   CLINICAL DATA:  LEFT back abscess below scapula for 3 days, nausea at night, hypertension, diabetes  EXAM: PORTABLE CHEST - 1 VIEW  COMPARISON:  Portable exam 1608 hr compared to 06/10/2011  FINDINGS: Upper normal heart size.  Normal mediastinal contours and pulmonary vascularity.  Lungs clear.  No pleural effusion or pneumothorax.  Bones unremarkable.  IMPRESSION: No acute abnormalities.   Electronically Signed   By: Angelyn Punt.D.  On: 12/14/2014 16:27     EKG Interpretation None      MDM   Final diagnoses:  Diabetes mellitus due to underlying condition without complications  Cellulitis and abscess  Hyperglycemia  Fever, unspecified fever cause    Lindsay [name] presents with abscess to the left upper back with history of insulin diabetes has not been controlled for the last month.  Will check labs, give pain control and evaluate abscess with [place].  Concern for sepsis vs DKA.    4:00 PM Labs reassuring. PH 7.48, anion gap 19, white blood cell count 14.  Patient's fever and tachycardia have resolved. Chest x-ray and other sepsis labs pending. Patient will need I&D of the abscess on her back.  5:00 PM Incision and drainage of the abscess resulted in a large amount appear to align drainage. Patient reports some relief after the incision and drainage.  She continued to give fluids. Will recheck BMP to ensure resolution of anion gap.  7:25PM Patient alert, oriented, nontoxic and nonseptic appearing. No elevation in her lactic acid and BMP with normalizing anion gap. Decrease in blood sugar. Patient's  insulin prescriptions up and refill. I have instructed her to obtain a new glucometer and follow up with the [facility] where she is already an established patient. Patient's abscess was packed due to the size and she is to return here to the emergency department for wound check within 2 days. She's to return sooner for persistent fevers, worsening illness or vomiting.  I have personally reviewed patient's vitals, nursing note and any pertinent labs or imaging.  I performed an undressed physical exam.    It has been determined that no acute conditions requiring further emergency intervention are present at this time. The patient/guardian have been advised of the diagnosis and plan. I reviewed all labs and imaging including any potential incidental findings. We have discussed signs and symptoms that warrant return to the ED and they are listed in the discharge instructions.    Vital signs are stable at discharge.   BP 135/86 mmHg  Pulse 98  Ht 5\' 8"  (1.727 m)  Wt 348 lb (157.852 kg)  BMI 52.93 kg/m2  SpO2 96%  LMP 11/17/2014  The patient was discussed with and seen by Dr. Littie Deeds who agrees with the treatment plan.   Dahlia Client Demetri Kerman, PA-C 12/15/14 0111  Mirian Mo, MD 12/22/14 1045

## 2014-12-16 LAB — URINE CULTURE: Colony Count: 3000

## 2014-12-17 ENCOUNTER — Encounter (HOSPITAL_COMMUNITY): Payer: Self-pay | Admitting: Family Medicine

## 2014-12-17 ENCOUNTER — Emergency Department (HOSPITAL_COMMUNITY)
Admission: EM | Admit: 2014-12-17 | Discharge: 2014-12-17 | Disposition: A | Discharge status: Home / Self Care | Payer: Medicaid Other | Attending: Emergency Medicine | Admitting: Emergency Medicine

## 2014-12-17 DIAGNOSIS — J45909 Unspecified asthma, uncomplicated: Secondary | ICD-10-CM | POA: Insufficient documentation

## 2014-12-17 DIAGNOSIS — Z79899 Other long term (current) drug therapy: Secondary | ICD-10-CM | POA: Diagnosis not present

## 2014-12-17 DIAGNOSIS — Z794 Long term (current) use of insulin: Secondary | ICD-10-CM | POA: Diagnosis not present

## 2014-12-17 DIAGNOSIS — I10 Essential (primary) hypertension: Secondary | ICD-10-CM | POA: Diagnosis not present

## 2014-12-17 DIAGNOSIS — E119 Type 2 diabetes mellitus without complications: Secondary | ICD-10-CM | POA: Insufficient documentation

## 2014-12-17 DIAGNOSIS — Z4802 Encounter for removal of sutures: Secondary | ICD-10-CM | POA: Insufficient documentation

## 2014-12-17 DIAGNOSIS — Z4800 Encounter for change or removal of nonsurgical wound dressing: Secondary | ICD-10-CM

## 2014-12-17 DIAGNOSIS — Z7951 Long term (current) use of inhaled steroids: Secondary | ICD-10-CM | POA: Diagnosis not present

## 2014-12-17 DIAGNOSIS — Z792 Long term (current) use of antibiotics: Secondary | ICD-10-CM | POA: Insufficient documentation

## 2014-12-17 DIAGNOSIS — Z7982 Long term (current) use of aspirin: Secondary | ICD-10-CM | POA: Insufficient documentation

## 2014-12-17 DIAGNOSIS — E669 Obesity, unspecified: Secondary | ICD-10-CM | POA: Insufficient documentation

## 2014-12-17 NOTE — ED Notes (Signed)
Per pt here for a wound recheck she had packed a few days. sts improved and draining.

## 2014-12-17 NOTE — ED Provider Notes (Signed)
CSN: [id number]     Arrival date & time 12/17/14  6720 History   First MD Initiated Contact with Patient 12/17/14 [phone]     Chief Complaint  Patient presents with  . Wound Check     (Consider location/radiation/quality/duration/timing/severity/associated sxs/prior Treatment) HPI Comments: Patient with history of diabetes presents with complaint of abscess. She is here for packing removal. Patient had abscess I&D performed 3 days ago. She was placed on antibiotics which she has been taking. Patient notes much improvement in her pain. Area continues to drain. No recent fevers, nausea, or vomiting. Patient has had multiple abscesses in the past. No other complaints.  Patient is a 33 y.o. female presenting with wound check. The history is provided by the patient.  Wound Check Pertinent negatives include no fever, nausea or vomiting.    Past Medical History  Diagnosis Date  . Hypertension   . Diabetes mellitus   . Asthma   . Obesity    Past Surgical History  Procedure Laterality Date  . Cesarean section    . Tonsillectomy    . Leg surgery     Family History  Problem Relation Age of Onset  . Diabetes Mother   . Hypertension Mother   . Heart disease Mother   . Diabetes Father   . Heart disease Father   . Stroke Maternal Grandmother   . Cancer Maternal Grandmother    History  Substance Use Topics  . Smoking status: Never Smoker   . Smokeless tobacco: Not on file  . Alcohol Use: No   OB History    No data available     Review of Systems  Constitutional: Negative for fever.  Gastrointestinal: Negative for nausea and vomiting.  Skin: Negative for color change.       Positive for abscess.  Hematological: Negative for adenopathy.      Allergies  Review of patient's allergies indicates no known allergies.  Home Medications   Prior to Admission medications   Medication Sig Start Date End Date Taking? Authorizing Provider  acetaminophen (TYLENOL) 500 MG tablet Take  1,000 mg by mouth every 6 (six) hours as needed for moderate pain.    Historical Provider, MD  aspirin 81 MG tablet Take 81 mg by mouth daily.    Historical Provider, MD  cephALEXin (KEFLEX) 500 MG capsule Take 1 capsule (500 mg total) by mouth 4 (four) times daily. Patient not taking: Reported on 12/14/2014 07/16/14   Doris Cheadle, MD  cephALEXin (KEFLEX) 500 MG capsule Take 1 capsule (500 mg total) by mouth 4 (four) times daily. Patient not taking: Reported on 12/14/2014 10/10/14   Monte Fantasia, PA-C  clindamycin (CLEOCIN) 150 MG capsule Take 3 capsules (450 mg total) by mouth 3 (three) times daily. 12/14/14   Hannah Muthersbaugh, PA-C  HYDROcodone-acetaminophen (NORCO/VICODIN) 5-325 MG per tablet Take 1-2 tablets by mouth every 6 (six) hours as needed for moderate pain or severe pain. Patient not taking: Reported on 12/14/2014 10/10/14   Monte Fantasia, PA-C  hydrocortisone cream 1 % Apply to affected area 2 times daily 10/10/14   Monte Fantasia, PA-C  insulin aspart (NOVOLOG) 100 UNIT/ML FlexPen Inject 20 units with breakfast and lunch, and 25 units before supper daily, subcutaneous 12/14/14   Hannah Muthersbaugh, PA-C  insulin glargine (LANTUS) 100 unit/mL SOPN Inject 0.65 mLs (65 Units total) into the skin at bedtime. 12/14/14   Hannah Muthersbaugh, PA-C  metFORMIN (GLUCOPHAGE) 1000 MG tablet Take 1 tablet (1,000 mg total) by mouth  2 (two) times daily with a meal. 07/16/14   Doris Cheadleeepak Advani, MD  Neomycin-Bacitracin-Polymyxin (NEOSPORIN EX) Apply 1 application topically daily as needed (for bites).    Historical Provider, MD  oxyCODONE-acetaminophen (PERCOCET) 5-325 MG per tablet Take 1 tablet by mouth every 4 (four) hours as needed. 12/14/14   Hannah Muthersbaugh, PA-C  sulfamethoxazole-trimethoprim (BACTRIM DS,SEPTRA DS) 800-160 MG per tablet Take 1 tablet by mouth 2 (two) times daily. Patient not taking: Reported on 12/14/2014 10/10/14   Monte FantasiaJoseph W Mintz, PA-C  Vitamin D, Ergocalciferol,  (DRISDOL) 50000 UNITS CAPS capsule Take 1 capsule (50,000 Units total) by mouth every 7 (seven) days. 07/18/14   Doris Cheadleeepak Advani, MD   BP 153/107 mmHg  Pulse 84  Temp(Src) 98.2 F (36.8 C) (Oral)  Resp 18  SpO2 98%  LMP 11/17/2014 Physical Exam  Constitutional: She appears well-developed and well-nourished.  HENT:  Head: Normocephalic and atraumatic.  Eyes: Conjunctivae are normal.  Neck: Normal range of motion. Neck supple.  Pulmonary/Chest: No respiratory distress.  Neurological: She is alert.  Skin: Skin is warm and dry.  3-4 cm of induration left upper back consistent with abscess. Abscess is open and draining. There is yellow purulent fluid noted. No surrounding erythema. Minimal tenderness with palpation around the area of induration.  Psychiatric: She has a normal mood and affect.  Nursing note and vitals reviewed.   ED Course  Procedures (including critical care time) Labs Review Labs Reviewed - No data to display  Imaging Review No results found.   EKG Interpretation None       9:30 AM Patient seen and examined.   Vital signs reviewed and are as follows: BP 153/107 mmHg  Pulse 84  Temp(Src) 98.2 F (36.8 C) (Oral)  Resp 18  SpO2 98%  LMP 11/17/2014  Packing removed. New bandage applied by myself.  The patient was urged to return to the Emergency Department urgently with worsening pain, swelling, expanding erythema especially if it streaks away from the affected area, fever, or if they have any other concerns.   The patient was instructed to remove packing at home in 48 hours and return to the Emergency Department or go to their PCP at that time for a recheck if their symptoms are not entirely resolved.  Patient encouraged to take entire course of antibiotics as directed. She will continue home pain medication.  The patient verbalized understanding and stated agreement with this plan.     MDM   Final diagnoses:  Abscess packing removal   Patients  with abscess, improving. She is on antibiotics given diabetes and fever previously. Patient appears well, nontoxic. Feel that she can be discharged home with continuation of care. No return instructed unless symptoms are not improving or she feels worse.    Renne CriglerJoshua Karmelo Bass, PA-C 12/17/14 [id number]  Candyce ChurnJohn David Wofford III, MD 12/18/14 [phone]

## 2014-12-17 NOTE — Discharge Instructions (Signed)
Please read and follow all provided instructions.  Your diagnoses today include:  1. Abscess packing removal     Tests performed today include:  Vital signs. See below for your results today.   Medications prescribed:   None  Home care instructions:   Follow any educational materials contained in this packet  Follow-up instructions: Return to the Emergency Department in 48 hours for a recheck if your symptoms are not significantly improved.  Please follow-up with your primary care provider in the next 3 days for further evaluation of your symptoms.   Return instructions:  Return to the Emergency Department if you have:  Fever  Worsening symptoms  Worsening pain  Worsening swelling  Redness of the skin that moves away from the affected area, especially if it streaks away from the affected area   Any other emergent concerns  Your vital signs today were: BP 153/107 mmHg   Pulse 84   Temp(Src) 98.2 F (36.8 C) (Oral)   Resp 18   SpO2 98%   LMP 11/17/2014 If your blood pressure (BP) was elevated above 135/85 this visit, please have this repeated by your doctor within one month. --------------

## 2014-12-23 LAB — CULTURE, BLOOD (ROUTINE X 2)
CULTURE: NO GROWTH
CULTURE: NO GROWTH

## 2015-02-13 ENCOUNTER — Encounter: Payer: Self-pay | Admitting: Internal Medicine

## 2015-02-13 ENCOUNTER — Ambulatory Visit: Payer: Medicaid Other | Attending: Internal Medicine | Admitting: Internal Medicine

## 2015-02-13 VITALS — BP 147/93 | HR 87 | Temp 98.0°F | Resp 16 | Wt 341.6 lb

## 2015-02-13 DIAGNOSIS — Z23 Encounter for immunization: Secondary | ICD-10-CM | POA: Diagnosis not present

## 2015-02-13 DIAGNOSIS — E139 Other specified diabetes mellitus without complications: Secondary | ICD-10-CM

## 2015-02-13 DIAGNOSIS — R202 Paresthesia of skin: Secondary | ICD-10-CM | POA: Diagnosis not present

## 2015-02-13 DIAGNOSIS — D72829 Elevated white blood cell count, unspecified: Secondary | ICD-10-CM | POA: Insufficient documentation

## 2015-02-13 DIAGNOSIS — I10 Essential (primary) hypertension: Secondary | ICD-10-CM | POA: Diagnosis not present

## 2015-02-13 DIAGNOSIS — R200 Anesthesia of skin: Secondary | ICD-10-CM

## 2015-02-13 DIAGNOSIS — E089 Diabetes mellitus due to underlying condition without complications: Secondary | ICD-10-CM

## 2015-02-13 DIAGNOSIS — Z794 Long term (current) use of insulin: Secondary | ICD-10-CM | POA: Insufficient documentation

## 2015-02-13 DIAGNOSIS — E669 Obesity, unspecified: Secondary | ICD-10-CM | POA: Diagnosis not present

## 2015-02-13 DIAGNOSIS — E1165 Type 2 diabetes mellitus with hyperglycemia: Secondary | ICD-10-CM | POA: Diagnosis not present

## 2015-02-13 DIAGNOSIS — Z7982 Long term (current) use of aspirin: Secondary | ICD-10-CM | POA: Diagnosis not present

## 2015-02-13 DIAGNOSIS — Z792 Long term (current) use of antibiotics: Secondary | ICD-10-CM | POA: Insufficient documentation

## 2015-02-13 LAB — COMPLETE METABOLIC PANEL WITH GFR
ALBUMIN: 3.9 g/dL (ref 3.5–5.2)
ALT: 12 U/L (ref 0–35)
AST: 12 U/L (ref 0–37)
Alkaline Phosphatase: 70 U/L (ref 39–117)
BUN: 10 mg/dL (ref 6–23)
CHLORIDE: 101 meq/L (ref 96–112)
CO2: 25 meq/L (ref 19–32)
Calcium: 9.2 mg/dL (ref 8.4–10.5)
Creat: 0.6 mg/dL (ref 0.50–1.10)
GFR, Est Non African American: >89 mL/min
GLUCOSE: 245 mg/dL — AB (ref 70–99)
POTASSIUM: 4.3 meq/L (ref 3.5–5.3)
SODIUM: 136 meq/L (ref 135–145)
TOTAL PROTEIN: 7.2 g/dL (ref 6.0–8.3)
Total Bilirubin: 0.7 mg/dL (ref 0.2–1.2)

## 2015-02-13 LAB — CBC WITH DIFFERENTIAL/PLATELET
Basophils Absolute: 0 10*3/uL (ref 0.0–0.1)
Basophils Relative: 0 % (ref 0–1)
Eosinophils Absolute: 0.2 10*3/uL (ref 0.0–0.7)
Eosinophils Relative: 2 % (ref 0–5)
HEMATOCRIT: 39.6 % (ref 36.0–46.0)
HEMOGLOBIN: 12.6 g/dL (ref 12.0–15.0)
LYMPHS ABS: 3.3 10*3/uL (ref 0.7–4.0)
LYMPHS PCT: 43 % (ref 12–46)
MCH: 24.6 pg — AB (ref 26.0–34.0)
MCHC: 31.8 g/dL (ref 30.0–36.0)
MCV: 77.3 fL — ABNORMAL LOW (ref 78.0–100.0)
MPV: 11.1 fL (ref 8.6–12.4)
Monocytes Absolute: 0.8 10*3/uL (ref 0.1–1.0)
Monocytes Relative: 10 % (ref 3–12)
NEUTROS ABS: 3.4 10*3/uL (ref 1.7–7.7)
Neutrophils Relative %: 45 % (ref 43–77)
Platelets: 243 10*3/uL (ref 150–400)
RBC: 5.12 MIL/uL — ABNORMAL HIGH (ref 3.87–5.11)
RDW: 15.5 % (ref 11.5–15.5)
WBC: 7.6 10*3/uL (ref 4.0–10.5)

## 2015-02-13 LAB — POCT GLYCOSYLATED HEMOGLOBIN (HGB A1C): Hemoglobin A1C: 12.5

## 2015-02-13 LAB — GLUCOSE, POCT (MANUAL RESULT ENTRY): POC Glucose: 248 mg/dl — AB (ref 70–99)

## 2015-02-13 MED ORDER — LISINOPRIL 5 MG PO TABS: 5.0000 mg | ORAL_TABLET | Freq: Every day | ORAL | Status: DC

## 2015-02-13 MED ORDER — INSULIN GLARGINE 100 UNITS/ML SOLOSTAR PEN: 65.0000 [IU] | PEN_INJECTOR | Freq: Every day | SUBCUTANEOUS | Status: DC

## 2015-02-13 MED ORDER — METFORMIN HCL 1000 MG PO TABS: 1000.0000 mg | ORAL_TABLET | Freq: Two times a day (BID) | ORAL | Status: DC

## 2015-02-13 MED ORDER — INSULIN ASPART 100 UNIT/ML FLEXPEN: PEN_INJECTOR | SUBCUTANEOUS | Status: DC

## 2015-02-13 MED ORDER — GABAPENTIN 300 MG PO CAPS: 300.0000 mg | ORAL_CAPSULE | Freq: Three times a day (TID) | ORAL | Status: DC

## 2015-02-13 NOTE — Progress Notes (Signed)
Patient here for follow up Complains of numbness to her lower right leg that extends to her foot Left ear pain States her blood pressure has been elevated recently and complains of headaches and  Some dizziness

## 2015-02-13 NOTE — Progress Notes (Signed)
MRN: [id number] Name: Lindsay [name]  Sex: female Age: 34 y.o. DOB: 1981-04-13  Allergies: Review of patient's allergies indicates no known allergies.  Chief Complaint  Patient presents with  . Headache    HPI: Patient is 34 y.o. female who has history of diabetes comes today for followup, as per patient she ran out of for insulin but has been taking metformin, as per patient her blood sugar has been running high, also she has noticed her blood pressure has been elevated recently and sometimes he get headache, as per patient she used to follow up with endocrinologist in the past, today her A1c noted to be 2.5% which is slightly improved compared to last visit.patient also reports some numbness tingling in legs/feet  Past Medical History  Diagnosis Date  . Hypertension   . Diabetes mellitus   . Asthma   . Obesity     Past Surgical History  Procedure Laterality Date  . Cesarean section    . Tonsillectomy    . Leg surgery        Medication List       This list is accurate as of: 02/13/15  4:24 PM.  Always use your most recent med list.               acetaminophen 500 MG tablet  Commonly known as:  TYLENOL  Take 1,000 mg by mouth every 6 (six) hours as needed for moderate pain.     aspirin 81 MG tablet  Take 81 mg by mouth daily.     cephALEXin 500 MG capsule  Commonly known as:  KEFLEX  Take 1 capsule (500 mg total) by mouth 4 (four) times daily.     cephALEXin 500 MG capsule  Commonly known as:  KEFLEX  Take 1 capsule (500 mg total) by mouth 4 (four) times daily.     clindamycin 150 MG capsule  Commonly known as:  CLEOCIN  Take 3 capsules (450 mg total) by mouth 3 (three) times daily.     gabapentin 300 MG capsule  Commonly known as:  NEURONTIN  Take 1 capsule (300 mg total) by mouth 3 (three) times daily.     HYDROcodone-acetaminophen 5-325 MG per tablet  Commonly known as:  NORCO/VICODIN  Take 1-2 tablets by mouth every 6 (six) hours as needed  for moderate pain or severe pain.     hydrocortisone cream 1 %  Apply to affected area 2 times daily     insulin aspart 100 UNIT/ML FlexPen  Commonly known as:  NOVOLOG  Inject 20 units with breakfast and lunch, and 25 units before supper daily, subcutaneous     insulin glargine 100 unit/mL Sopn  Commonly known as:  LANTUS  Inject 0.65 mLs (65 Units total) into the skin at bedtime.     lisinopril 5 MG tablet  Commonly known as:  PRINIVIL,ZESTRIL  Take 1 tablet (5 mg total) by mouth daily.     metFORMIN 1000 MG tablet  Commonly known as:  GLUCOPHAGE  Take 1 tablet (1,000 mg total) by mouth 2 (two) times daily with a meal.     NEOSPORIN EX  Apply 1 application topically daily as needed (for bites).     oxyCODONE-acetaminophen 5-325 MG per tablet  Commonly known as:  PERCOCET  Take 1 tablet by mouth every 4 (four) hours as needed.     sulfamethoxazole-trimethoprim 800-160 MG per tablet  Commonly known as:  BACTRIM DS,SEPTRA DS  Take 1 tablet by mouth  2 (two) times daily.     Vitamin D (Ergocalciferol) 50000 UNITS Caps capsule  Commonly known as:  DRISDOL  Take 1 capsule (50,000 Units total) by mouth every 7 (seven) days.        Meds ordered this encounter  Medications  . gabapentin (NEURONTIN) 300 MG capsule    Sig: Take 1 capsule (300 mg total) by mouth 3 (three) times daily.    Dispense:  90 capsule    Refill:  3  . insulin aspart (NOVOLOG) 100 UNIT/ML FlexPen    Sig: Inject 20 units with breakfast and lunch, and 25 units before supper daily, subcutaneous    Dispense:  15 pen    Refill:  3  . insulin glargine (LANTUS) 100 unit/mL SOPN    Sig: Inject 0.65 mLs (65 Units total) into the skin at bedtime.    Dispense:  15 pen    Refill:  3  . metFORMIN (GLUCOPHAGE) 1000 MG tablet    Sig: Take 1 tablet (1,000 mg total) by mouth 2 (two) times daily with a meal.    Dispense:  180 tablet    Refill:  3  . lisinopril (PRINIVIL,ZESTRIL) 5 MG tablet    Sig: Take 1 tablet  (5 mg total) by mouth daily.    Dispense:  90 tablet    Refill:  3    Immunization History  Administered Date(s) Administered  . Pneumococcal Polysaccharide-23 02/13/2015    Family History  Problem Relation Age of Onset  . Diabetes Mother   . Hypertension Mother   . Heart disease Mother   . Diabetes Father   . Heart disease Father   . Stroke Maternal Grandmother   . Cancer Maternal Grandmother     History  Substance Use Topics  . Smoking status: Never Smoker   . Smokeless tobacco: Not on file  . Alcohol Use: No    Review of Systems   As noted in HPI  Filed Vitals:   02/13/15 1540  BP: 147/93  Pulse: 87  Temp: 98 F (36.7 C)  Resp: 16    Physical Exam  Physical Exam  Constitutional:  Obese female sitting comfortably not in acute distress.  Eyes: EOM are normal. Pupils are equal, round, and reactive to light.  Cardiovascular: Normal rate and regular rhythm.   Pulmonary/Chest: Breath sounds normal. No respiratory distress. She has no wheezes. She has no rales.  Musculoskeletal: She exhibits no edema.    CBC    Component Value Date/Time   WBC 14.0* 12/14/2014 1555   RBC 5.04 12/14/2014 1555   HGB 12.5 12/14/2014 1555   HCT 38.5 12/14/2014 1555   PLT 257 12/14/2014 1555   MCV 76.4* 12/14/2014 1555   LYMPHSABS 2.3 12/14/2014 1555   MONOABS 1.0 12/14/2014 1555   EOSABS 0.1 12/14/2014 1555   BASOSABS 0.0 12/14/2014 1555    CMP     Component Value Date/Time   NA 131* 12/14/2014 1844   K 4.0 12/14/2014 1844   CL 98 12/14/2014 1844   CO2 21 12/14/2014 1844   GLUCOSE 298* 12/14/2014 1844   BUN 6 12/14/2014 1844   CREATININE 0.79 12/14/2014 1844   CALCIUM 8.2* 12/14/2014 1844   PROT 7.6 07/11/2014 1548   ALBUMIN 3.2* 07/11/2014 1548   AST 12 07/11/2014 1548   ALT 11 07/11/2014 1548   ALKPHOS 110 07/11/2014 1548   BILITOT 0.6 07/11/2014 1548   GFRNONAA >90 12/14/2014 1844   GFRAA >90 12/14/2014 1844    Lab Results  Component Value Date/Time     CHOL 165 07/16/2014 10:42 AM    No components found for: HGA1C  Lab Results  Component Value Date/Time   AST 12 07/11/2014 03:48 PM    Assessment and Plan  Other specified diabetes mellitus without complications - Plan:  Results for orders placed or performed in visit on 02/13/15  Glucose (CBG)  Result Value Ref Range   POC Glucose 248.0 (A) 70 - 99 mg/dl  HgB Q4O  Result Value Ref Range   Hemoglobin A1C 12.50    Diabetes is still uncontrolled, HgB A1c is slightly improved, will resume her back on her medications, also advise patient for diabetes maintaining,, Ambulatory referral to Endocrinology, insulin aspart (NOVOLOG) 100 UNIT/ML FlexPen, insulin glargine (LANTUS) 100 unit/mL SOPN, metFORMIN (GLUCOPHAGE) 1000 MG tablet,repeat A1c in 3 months.  Numbness and tingling - Plan: trial of gabapentin (NEURONTIN) 300 MG capsule  Essential hypertension - Plan: I have started patient on lisinopril, will check blood chemistryCOMPLETE METABOLIC PANEL WITH GFR   Leukocytosis - Plan: will repeat CBC with Differential/Platelet  Need for prophylactic vaccination against Streptococcus pneumoniae (pneumococcus) - Plan: Pneumococcal polysaccharide vaccine 23-valent greater than or equal to 2yo subcutaneous/IM   Health Maintenance  -Vaccinations:  Pneumovax given today, patient declined flu shot.   Return in about 3 months (around 05/14/2015) for hypertension, diabetes, BP check in 2 weeks/Nurse Visit.   This note has been created with Education officer, environmental. Any transcriptional errors are unintentional.    Doris Cheadle, MD

## 2015-02-13 NOTE — Patient Instructions (Signed)
Diabetes Mellitus and Food It is important for you to manage your blood sugar (glucose) level. Your blood glucose level can be greatly affected by what you eat. Eating healthier foods in the appropriate amounts throughout the day at about the same time each day will help you control your blood glucose level. It can also help slow or prevent worsening of your diabetes mellitus. Healthy eating may even help you improve the level of your blood pressure and reach or maintain a healthy weight.  HOW CAN FOOD AFFECT ME? Carbohydrates Carbohydrates affect your blood glucose level more than any other type of food. Your dietitian will help you determine how many carbohydrates to eat at each meal and teach you how to count carbohydrates. Counting carbohydrates is important to keep your blood glucose at a healthy level, especially if you are using insulin or taking certain medicines for diabetes mellitus. Alcohol Alcohol can cause sudden decreases in blood glucose (hypoglycemia), especially if you use insulin or take certain medicines for diabetes mellitus. Hypoglycemia can be a life-threatening condition. Symptoms of hypoglycemia (sleepiness, dizziness, and disorientation) are similar to symptoms of having too much alcohol.  If your health care provider has given you approval to drink alcohol, do so in moderation and use the following guidelines:  Women should not have more than one drink per day, and men should not have more than two drinks per day. One drink is equal to:  12 oz of beer.  5 oz of wine.  1 oz of hard liquor.  Do not drink on an empty stomach.  Keep yourself hydrated. Have water, diet soda, or unsweetened iced tea.  Regular soda, juice, and other mixers might contain a lot of carbohydrates and should be counted. WHAT FOODS ARE NOT RECOMMENDED? As you make food choices, it is important to remember that all foods are not the same. Some foods have fewer nutrients per serving than other  foods, even though they might have the same number of calories or carbohydrates. It is difficult to get your body what it needs when you eat foods with fewer nutrients. Examples of foods that you should avoid that are high in calories and carbohydrates but low in nutrients include:  Trans fats (most processed foods list trans fats on the Nutrition Facts label).  Regular soda.  Juice.  Candy.  Sweets, such as cake, pie, doughnuts, and cookies.  Fried foods. WHAT FOODS CAN I EAT? Have nutrient-rich foods, which will nourish your body and keep you healthy. The food you should eat also will depend on several factors, including:  The calories you need.  The medicines you take.  Your weight.  Your blood glucose level.  Your blood pressure level.  Your cholesterol level. You also should eat a variety of foods, including:  Protein, such as meat, poultry, fish, tofu, nuts, and seeds (lean animal proteins are best).  Fruits.  Vegetables.  Dairy products, such as milk, cheese, and yogurt (low fat is best).  Breads, grains, pasta, cereal, rice, and beans.  Fats such as olive oil, trans fat-free margarine, canola oil, avocado, and olives. DOES EVERYONE WITH DIABETES MELLITUS HAVE THE SAME MEAL PLAN? Because every person with diabetes mellitus is different, there is not one meal plan that works for everyone. It is very important that you meet with a dietitian who will help you create a meal plan that is just right for you. Document Released: 09/03/2005 Document Revised: 12/12/2013 Document Reviewed: 11/03/2013 ExitCare Patient Information 2015 [employer]. This   information is not intended to replace advice given to you by your health care provider. Make sure you discuss any questions you have with your health care provider. DASH Eating Plan DASH stands for "Dietary Approaches to Stop Hypertension." The DASH eating plan is a healthy eating plan that has been shown to reduce high  blood pressure (hypertension). Additional health benefits may include reducing the risk of type 2 diabetes mellitus, heart disease, and stroke. The DASH eating plan may also help with weight loss. WHAT DO I NEED TO KNOW ABOUT THE DASH EATING PLAN? For the DASH eating plan, you will follow these general guidelines:  Choose foods with a percent daily value for sodium of less than 5% (as listed on the food label).  Use salt-free seasonings or herbs instead of table salt or sea salt.  Check with your health care provider or pharmacist before using salt substitutes.  Eat lower-sodium products, often labeled as "lower sodium" or "no salt added."  Eat fresh foods.  Eat more vegetables, fruits, and low-fat dairy products.  Choose whole grains. Look for the word "whole" as the first word in the ingredient list.  Choose fish and skinless chicken or turkey more often than red meat. Limit fish, poultry, and meat to 6 oz (170 g) each day.  Limit sweets, desserts, sugars, and sugary drinks.  Choose heart-healthy fats.  Limit cheese to 1 oz (28 g) per day.  Eat more home-cooked food and less restaurant, buffet, and fast food.  Limit fried foods.  Cook foods using methods other than frying.  Limit canned vegetables. If you do use them, rinse them well to decrease the sodium.  When eating at a restaurant, ask that your food be prepared with less salt, or no salt if possible. WHAT FOODS CAN I EAT? Seek help from a dietitian for individual calorie needs. Grains Whole grain or whole wheat bread. Brown rice. Whole grain or whole wheat pasta. Quinoa, bulgur, and whole grain cereals. Low-sodium cereals. Corn or whole wheat flour tortillas. Whole grain cornbread. Whole grain crackers. Low-sodium crackers. Vegetables Fresh or frozen vegetables (raw, steamed, roasted, or grilled). Low-sodium or reduced-sodium tomato and vegetable juices. Low-sodium or reduced-sodium tomato sauce and paste. Low-sodium  or reduced-sodium canned vegetables.  Fruits All fresh, canned (in natural juice), or frozen fruits. Meat and Other Protein Products Ground beef (85% or leaner), grass-fed beef, or beef trimmed of fat. Skinless chicken or turkey. Ground chicken or turkey. Pork trimmed of fat. All fish and seafood. Eggs. Dried beans, peas, or lentils. Unsalted nuts and seeds. Unsalted canned beans. Dairy Low-fat dairy products, such as skim or 1% milk, 2% or reduced-fat cheeses, low-fat ricotta or cottage cheese, or plain low-fat yogurt. Low-sodium or reduced-sodium cheeses. Fats and Oils Tub margarines without trans fats. Light or reduced-fat mayonnaise and salad dressings (reduced sodium). Avocado. Safflower, olive, or canola oils. Natural peanut or almond butter. Other Unsalted popcorn and pretzels. The items listed above may not be a complete list of recommended foods or beverages. Contact your dietitian for more options. WHAT FOODS ARE NOT RECOMMENDED? Grains White bread. White pasta. White rice. Refined cornbread. Bagels and croissants. Crackers that contain trans fat. Vegetables Creamed or fried vegetables. Vegetables in a cheese sauce. Regular canned vegetables. Regular canned tomato sauce and paste. Regular tomato and vegetable juices. Fruits Dried fruits. Canned fruit in light or heavy syrup. Fruit juice. Meat and Other Protein Products Fatty cuts of meat. Ribs, chicken wings, bacon, sausage, bologna, salami, chitterlings, fatback, hot   dogs, bratwurst, and packaged luncheon meats. Salted nuts and seeds. Canned beans with salt. Dairy Whole or 2% milk, cream, half-and-half, and cream cheese. Whole-fat or sweetened yogurt. Full-fat cheeses or blue cheese. Nondairy creamers and whipped toppings. Processed cheese, cheese spreads, or cheese curds. Condiments Onion and garlic salt, seasoned salt, table salt, and sea salt. Canned and packaged gravies. Worcestershire sauce. Tartar sauce. Barbecue sauce.  Teriyaki sauce. Soy sauce, including reduced sodium. Steak sauce. Fish sauce. Oyster sauce. Cocktail sauce. Horseradish. Ketchup and mustard. Meat flavorings and tenderizers. Bouillon cubes. Hot sauce. Tabasco sauce. Marinades. Taco seasonings. Relishes. Fats and Oils Butter, stick margarine, lard, shortening, ghee, and bacon fat. Coconut, palm kernel, or palm oils. Regular salad dressings. Other Pickles and olives. Salted popcorn and pretzels. The items listed above may not be a complete list of foods and beverages to avoid. Contact your dietitian for more information. WHERE CAN I FIND MORE INFORMATION? National Heart, Lung, and [employer] Institute: www.nhlbi.nih.gov/health/health-topics/topics/dash/ Document Released: 11/26/2011 Document Revised: 04/23/2014 Document Reviewed: 10/11/2013 ExitCare Patient Information 2015 [employer]. This information is not intended to replace advice given to you by your health care provider. Make sure you discuss any questions you have with your health care provider.  

## 2015-02-14 ENCOUNTER — Telehealth: Payer: Self-pay

## 2015-02-14 NOTE — Telephone Encounter (Signed)
Patient is aware of her lab results 

## 2015-02-14 NOTE — Telephone Encounter (Signed)
-----   Message from Doris Cheadle, MD sent at 02/14/2015  9:12 AM EST ----- Call and let the patient know that her WBC count is in normal range, her diabetes is uncontrolled advise patient again for diet modification and compliance with the medications.

## 2015-04-03 ENCOUNTER — Emergency Department (HOSPITAL_COMMUNITY)
Admission: EM | Admit: 2015-04-03 | Discharge: 2015-04-03 | Disposition: A | Discharge status: Home / Self Care | Payer: Medicaid Other | Attending: Emergency Medicine | Admitting: Emergency Medicine

## 2015-04-03 ENCOUNTER — Encounter (HOSPITAL_COMMUNITY): Payer: Self-pay | Admitting: Emergency Medicine

## 2015-04-03 DIAGNOSIS — I10 Essential (primary) hypertension: Secondary | ICD-10-CM | POA: Insufficient documentation

## 2015-04-03 DIAGNOSIS — Z7982 Long term (current) use of aspirin: Secondary | ICD-10-CM | POA: Diagnosis not present

## 2015-04-03 DIAGNOSIS — Z794 Long term (current) use of insulin: Secondary | ICD-10-CM | POA: Insufficient documentation

## 2015-04-03 DIAGNOSIS — J45909 Unspecified asthma, uncomplicated: Secondary | ICD-10-CM | POA: Diagnosis not present

## 2015-04-03 DIAGNOSIS — Z79899 Other long term (current) drug therapy: Secondary | ICD-10-CM | POA: Insufficient documentation

## 2015-04-03 DIAGNOSIS — Z792 Long term (current) use of antibiotics: Secondary | ICD-10-CM | POA: Insufficient documentation

## 2015-04-03 DIAGNOSIS — M65052 Abscess of tendon sheath, left thigh: Secondary | ICD-10-CM | POA: Insufficient documentation

## 2015-04-03 DIAGNOSIS — E119 Type 2 diabetes mellitus without complications: Secondary | ICD-10-CM | POA: Diagnosis not present

## 2015-04-03 DIAGNOSIS — L0291 Cutaneous abscess, unspecified: Secondary | ICD-10-CM

## 2015-04-03 MED ORDER — HYDROCODONE-ACETAMINOPHEN 5-325 MG PO TABS: 1.0000 | ORAL_TABLET | Freq: Four times a day (QID) | ORAL | Status: DC | PRN

## 2015-04-03 MED ORDER — CEPHALEXIN 500 MG PO CAPS: 500.0000 mg | ORAL_CAPSULE | Freq: Four times a day (QID) | ORAL | Status: DC

## 2015-04-03 MED ORDER — LIDOCAINE-EPINEPHRINE (PF) 2 %-1:200000 IJ SOLN
10.0000 mL | Freq: Once | INTRAMUSCULAR | Status: AC
  Administered 2015-04-03: 10 mL
  Filled 2015-04-03: qty 20

## 2015-04-03 NOTE — ED Provider Notes (Signed)
CSN: [id number]     Arrival date & time 04/03/15  4098 History   First MD Initiated Contact with Patient 04/03/15 [phone]     Chief Complaint  Patient presents with  . Abscess     (Consider location/radiation/quality/duration/timing/severity/associated sxs/prior Treatment) HPI Comments: Patient presents to the emergency department with chief complaint of abscesses. She reports having multiple abscesses to her left lower extremity. She states that the pain is moderate to severe. She states she noticed a abscesses about 3 days ago. They have been progressively worsening. She denies any associated fevers, chills, nausea, vomiting, or diarrhea. She reports having abscess in the past. The symptoms are aggravated with palpation and movement. She has not taken anything to alleviate her symptoms.  The history is provided by the patient. No language interpreter was used.    Past Medical History  Diagnosis Date  . Hypertension   . Diabetes mellitus   . Asthma   . Obesity    Past Surgical History  Procedure Laterality Date  . Cesarean section    . Tonsillectomy    . Leg surgery     Family History  Problem Relation Age of Onset  . Diabetes Mother   . Hypertension Mother   . Heart disease Mother   . Diabetes Father   . Heart disease Father   . Stroke Maternal Grandmother   . Cancer Maternal Grandmother    History  Substance Use Topics  . Smoking status: Never Smoker   . Smokeless tobacco: Not on file  . Alcohol Use: Yes     Comment: socially   OB History    No data available     Review of Systems  Constitutional: Negative for fever and chills.  Respiratory: Negative for shortness of breath.   Cardiovascular: Negative for chest pain.  Gastrointestinal: Negative for nausea, vomiting, diarrhea and constipation.  Genitourinary: Negative for dysuria.  Skin: Positive for color change.  All other systems reviewed and are negative.     Allergies  Review of patient's allergies  indicates no known allergies.  Home Medications   Prior to Admission medications   Medication Sig Start Date End Date Taking? Authorizing Provider  acetaminophen (TYLENOL) 500 MG tablet Take 1,000 mg by mouth every 6 (six) hours as needed for moderate pain.    Historical Provider, MD  aspirin 81 MG tablet Take 81 mg by mouth daily.    Historical Provider, MD  cephALEXin (KEFLEX) 500 MG capsule Take 1 capsule (500 mg total) by mouth 4 (four) times daily. Patient not taking: Reported on 12/14/2014 07/16/14   Doris Cheadle, MD  cephALEXin (KEFLEX) 500 MG capsule Take 1 capsule (500 mg total) by mouth 4 (four) times daily. Patient not taking: Reported on 12/14/2014 10/10/14   Ladona Mow, PA-C  clindamycin (CLEOCIN) 150 MG capsule Take 3 capsules (450 mg total) by mouth 3 (three) times daily. 12/14/14   Hannah Muthersbaugh, PA-C  gabapentin (NEURONTIN) 300 MG capsule Take 1 capsule (300 mg total) by mouth 3 (three) times daily. 02/13/15   Doris Cheadle, MD  HYDROcodone-acetaminophen (NORCO/VICODIN) 5-325 MG per tablet Take 1-2 tablets by mouth every 6 (six) hours as needed for moderate pain or severe pain. Patient not taking: Reported on 12/14/2014 10/10/14   Ladona Mow, PA-C  hydrocortisone cream 1 % Apply to affected area 2 times daily 10/10/14   Ladona Mow, PA-C  insulin aspart (NOVOLOG) 100 UNIT/ML FlexPen Inject 20 units with breakfast and lunch, and 25 units before supper daily, subcutaneous 02/13/15  Doris Cheadle, MD  insulin glargine (LANTUS) 100 unit/mL SOPN Inject 0.65 mLs (65 Units total) into the skin at bedtime. 02/13/15   Doris Cheadle, MD  lisinopril (PRINIVIL,ZESTRIL) 5 MG tablet Take 1 tablet (5 mg total) by mouth daily. 02/13/15   Doris Cheadle, MD  metFORMIN (GLUCOPHAGE) 1000 MG tablet Take 1 tablet (1,000 mg total) by mouth 2 (two) times daily with a meal. 02/13/15   Doris Cheadle, MD  Neomycin-Bacitracin-Polymyxin (NEOSPORIN EX) Apply 1 application topically daily as needed (for bites).     Historical Provider, MD  oxyCODONE-acetaminophen (PERCOCET) 5-325 MG per tablet Take 1 tablet by mouth every 4 (four) hours as needed. 12/14/14   Hannah Muthersbaugh, PA-C  sulfamethoxazole-trimethoprim (BACTRIM DS,SEPTRA DS) 800-160 MG per tablet Take 1 tablet by mouth 2 (two) times daily. Patient not taking: Reported on 12/14/2014 10/10/14   Ladona Mow, PA-C  Vitamin D, Ergocalciferol, (DRISDOL) 50000 UNITS CAPS capsule Take 1 capsule (50,000 Units total) by mouth every 7 (seven) days. 07/18/14   Doris Cheadle, MD   BP 125/58 mmHg  Pulse 98  Temp(Src) 98.4 F (36.9 C) (Oral)  Resp 18  Ht 5\' 7"  (1.702 m)  Wt 346 lb (156.945 kg)  BMI 54.18 kg/m2  SpO2 99%  LMP 03/28/2015 Physical Exam  Constitutional: She is oriented to person, place, and time. She appears well-developed and well-nourished.  Morbidly obese  HENT:  Head: Normocephalic and atraumatic.  Eyes: Conjunctivae and EOM are normal.  Neck: Normal range of motion.  Cardiovascular: Normal rate.   Pulmonary/Chest: Effort normal.  Abdominal: She exhibits no distension.  Musculoskeletal: Normal range of motion.  Neurological: She is alert and oriented to person, place, and time.  Skin: Skin is dry.  (1) 3 x 3 cm abscess to the left upper thigh, (2) 1 x 1 cm abscesses to the left upper thigh  Psychiatric: She has a normal mood and affect. Her behavior is normal. Judgment and thought content normal.  Nursing note and vitals reviewed.   ED Course  Procedures (including critical care time) Labs Review Labs Reviewed - No data to display INCISION AND DRAINAGE Performed by: Roxy Horseman Consent: Verbal consent obtained. Risks and benefits: risks, benefits and alternatives were discussed Type: abscess  Body area: left thigh  Anesthesia: local infiltration  Incision was made with a scalpel.  Local anesthetic: lidocaine 2% with epinephrine  Anesthetic total: 5 ml  Complexity: complex Blunt dissection to break up  loculations  Drainage: purulent  Drainage amount: moderate  Packing material: not packed  Patient tolerance: Patient tolerated the procedure well with no immediate complications.   INCISION AND DRAINAGE Performed by: Roxy Horseman Consent: Verbal consent obtained. Risks and benefits: risks, benefits and alternatives were discussed Type: abscess  Body area: left thigh  Anesthesia: local infiltration  Incision was made with a scalpel.  Local anesthetic: lidocaine 2% with epinephrine  Anesthetic total: 2 ml  Complexity: complex Blunt dissection to break up loculations  Drainage: purulent  Drainage amount: mild  Packing material: not packed  Patient tolerance: Patient tolerated the procedure well with no immediate complications.   INCISION AND DRAINAGE Performed by: Roxy Horseman Consent: Verbal consent obtained. Risks and benefits: risks, benefits and alternatives were discussed Type: abscess  Body area: left thigh  Anesthesia: local infiltration  Incision was made with a scalpel.  Local anesthetic: lidocaine 2% with epinephrine  Anesthetic total: 2 ml  Complexity: complex Blunt dissection to break up loculations  Drainage: purulent  Drainage amount: copious  Packing material:  not packed  Patient tolerance: Patient tolerated the procedure well with no immediate complications.    Imaging Review No results found.   EKG Interpretation None      MDM   Final diagnoses:  Abscess    Patient with multiple abscesses to the left upper thigh. Will treat with incision and drainage. Recommend follow-up with primary care provider. Patient likely needs to emphasize weight control and hygiene in order to prevent these from returning.  3 abscesses drained in the emergency department with good relief. Will discharge to home with Keflex for mild surrounding cellulitis. Recommend primary care follow-up and wound check in the next week.    Roxy Horseman, PA-C 04/03/15 0935  Richardean Canal, MD 04/03/15 [phone]

## 2015-04-03 NOTE — ED Notes (Signed)
Patient complains of multiple abscesses to L buttocks and one in between butt cheeks.   Patient denies other problems.

## 2015-04-03 NOTE — Discharge Instructions (Signed)

## 2015-04-19 ENCOUNTER — Emergency Department (HOSPITAL_COMMUNITY)
Admission: EM | Admit: 2015-04-19 | Discharge: 2015-04-19 | Disposition: A | Discharge status: Home / Self Care | Payer: No Typology Code available for payment source | Attending: Emergency Medicine | Admitting: Emergency Medicine

## 2015-04-19 ENCOUNTER — Encounter (HOSPITAL_COMMUNITY): Payer: Self-pay | Admitting: *Deleted

## 2015-04-19 ENCOUNTER — Emergency Department (HOSPITAL_COMMUNITY): Payer: No Typology Code available for payment source

## 2015-04-19 DIAGNOSIS — Z79899 Other long term (current) drug therapy: Secondary | ICD-10-CM | POA: Diagnosis not present

## 2015-04-19 DIAGNOSIS — J45909 Unspecified asthma, uncomplicated: Secondary | ICD-10-CM | POA: Insufficient documentation

## 2015-04-19 DIAGNOSIS — Z7952 Long term (current) use of systemic steroids: Secondary | ICD-10-CM | POA: Insufficient documentation

## 2015-04-19 DIAGNOSIS — M62838 Other muscle spasm: Secondary | ICD-10-CM | POA: Diagnosis not present

## 2015-04-19 DIAGNOSIS — Z792 Long term (current) use of antibiotics: Secondary | ICD-10-CM | POA: Diagnosis not present

## 2015-04-19 DIAGNOSIS — I10 Essential (primary) hypertension: Secondary | ICD-10-CM | POA: Insufficient documentation

## 2015-04-19 DIAGNOSIS — E669 Obesity, unspecified: Secondary | ICD-10-CM | POA: Insufficient documentation

## 2015-04-19 DIAGNOSIS — M25511 Pain in right shoulder: Secondary | ICD-10-CM | POA: Insufficient documentation

## 2015-04-19 DIAGNOSIS — Z7982 Long term (current) use of aspirin: Secondary | ICD-10-CM | POA: Insufficient documentation

## 2015-04-19 DIAGNOSIS — Z794 Long term (current) use of insulin: Secondary | ICD-10-CM | POA: Diagnosis not present

## 2015-04-19 DIAGNOSIS — E119 Type 2 diabetes mellitus without complications: Secondary | ICD-10-CM | POA: Diagnosis not present

## 2015-04-19 MED ORDER — CYCLOBENZAPRINE HCL 10 MG PO TABS: 10.0000 mg | ORAL_TABLET | Freq: Two times a day (BID) | ORAL | Status: DC | PRN

## 2015-04-19 MED ORDER — MELOXICAM 15 MG PO TABS: 15.0000 mg | ORAL_TABLET | Freq: Every day | ORAL | Status: DC

## 2015-04-19 MED ORDER — KETOROLAC TROMETHAMINE 60 MG/2ML IM SOLN
60.0000 mg | Freq: Once | INTRAMUSCULAR | Status: AC
  Administered 2015-04-19: 60 mg via INTRAMUSCULAR
  Filled 2015-04-19: qty 2

## 2015-04-19 NOTE — Discharge Instructions (Signed)
Take mobic as needed for pain. Take Flexeril as needed for muscle spasm. Refer to attached documents for more information. Follow up with your doctor.

## 2015-04-19 NOTE — ED Provider Notes (Signed)
CSN: [id number]     Arrival date & time 04/19/15  0025 History   First MD Initiated Contact with Patient 04/19/15 0113     Chief Complaint  Patient presents with  . Shoulder Pain     (Consider location/radiation/quality/duration/timing/severity/associated sxs/prior Treatment) Patient is a 34 y.o. female presenting with shoulder pain. The history is provided by the patient. No language interpreter was used.  Shoulder Pain Location:  Shoulder Time since incident:  2 days Injury: no   Shoulder location:  R shoulder Pain details:    Quality:  Aching   Radiates to:  Does not radiate   Severity:  Severe   Onset quality:  Sudden   Duration:  2 days   Timing:  Constant   Progression:  Unchanged Chronicity:  New Handedness:  Right-handed Dislocation: no   Foreign body present:  No foreign bodies Tetanus status:  Unknown Prior injury to area:  No Relieved by:  Nothing Worsened by:  Movement Ineffective treatments:  None tried Associated symptoms: no fatigue, no fever and no neck pain   Risk factors: no concern for non-accidental trauma, no known bone disorder, no frequent fractures and no recent illness     Past Medical History  Diagnosis Date  . Hypertension   . Diabetes mellitus   . Asthma   . Obesity    Past Surgical History  Procedure Laterality Date  . Cesarean section    . Tonsillectomy    . Leg surgery     Family History  Problem Relation Age of Onset  . Diabetes Mother   . Hypertension Mother   . Heart disease Mother   . Diabetes Father   . Heart disease Father   . Stroke Maternal Grandmother   . Cancer Maternal Grandmother    History  Substance Use Topics  . Smoking status: Never Smoker   . Smokeless tobacco: Not on file  . Alcohol Use: Yes     Comment: socially   OB History    No data available     Review of Systems  Constitutional: Negative for fever, chills and fatigue.  HENT: Negative for trouble swallowing.   Eyes: Negative for visual  disturbance.  Respiratory: Negative for shortness of breath.   Cardiovascular: Negative for chest pain and palpitations.  Gastrointestinal: Negative for nausea, vomiting, abdominal pain and diarrhea.  Genitourinary: Negative for dysuria and difficulty urinating.  Musculoskeletal: Positive for arthralgias. Negative for neck pain.  Skin: Negative for color change.  Neurological: Negative for dizziness and weakness.  Psychiatric/Behavioral: Negative for dysphoric mood.      Allergies  Review of patient's allergies indicates no known allergies.  Home Medications   Prior to Admission medications   Medication Sig Start Date End Date Taking? Authorizing Provider  acetaminophen (TYLENOL) 500 MG tablet Take 1,000 mg by mouth every 6 (six) hours as needed for moderate pain.    Historical Provider, MD  aspirin 81 MG tablet Take 81 mg by mouth daily.    Historical Provider, MD  cephALEXin (KEFLEX) 500 MG capsule Take 1 capsule (500 mg total) by mouth 4 (four) times daily. 04/03/15   Roxy Horseman, PA-C  clindamycin (CLEOCIN) 150 MG capsule Take 3 capsules (450 mg total) by mouth 3 (three) times daily. 12/14/14   Hannah Muthersbaugh, PA-C  gabapentin (NEURONTIN) 300 MG capsule Take 1 capsule (300 mg total) by mouth 3 (three) times daily. 02/13/15   Doris Cheadle, MD  HYDROcodone-acetaminophen (NORCO/VICODIN) 5-325 MG per tablet Take 1-2 tablets by mouth every  6 (six) hours as needed. 04/03/15   Roxy Horseman, PA-C  hydrocortisone cream 1 % Apply to affected area 2 times daily 10/10/14   Ladona Mow, PA-C  insulin aspart (NOVOLOG) 100 UNIT/ML FlexPen Inject 20 units with breakfast and lunch, and 25 units before supper daily, subcutaneous 02/13/15   Doris Cheadle, MD  insulin glargine (LANTUS) 100 unit/mL SOPN Inject 0.65 mLs (65 Units total) into the skin at bedtime. 02/13/15   Doris Cheadle, MD  lisinopril (PRINIVIL,ZESTRIL) 5 MG tablet Take 1 tablet (5 mg total) by mouth daily. 02/13/15   Doris Cheadle,  MD  metFORMIN (GLUCOPHAGE) 1000 MG tablet Take 1 tablet (1,000 mg total) by mouth 2 (two) times daily with a meal. 02/13/15   Doris Cheadle, MD  Neomycin-Bacitracin-Polymyxin (NEOSPORIN EX) Apply 1 application topically daily as needed (for bites).    Historical Provider, MD  oxyCODONE-acetaminophen (PERCOCET) 5-325 MG per tablet Take 1 tablet by mouth every 4 (four) hours as needed. 12/14/14   Hannah Muthersbaugh, PA-C  sulfamethoxazole-trimethoprim (BACTRIM DS,SEPTRA DS) 800-160 MG per tablet Take 1 tablet by mouth 2 (two) times daily. Patient not taking: Reported on 12/14/2014 10/10/14   Ladona Mow, PA-C  Vitamin D, Ergocalciferol, (DRISDOL) 50000 UNITS CAPS capsule Take 1 capsule (50,000 Units total) by mouth every 7 (seven) days. 07/18/14   Doris Cheadle, MD   BP 167/104 mmHg  Pulse 96  Temp(Src) 97.9 F (36.6 C) (Oral)  Resp 20  SpO2 99%  LMP 03/28/2015 Physical Exam  Constitutional: She is oriented to person, place, and time. She appears well-developed and well-nourished. No distress.  HENT:  Head: Normocephalic and atraumatic.  Eyes: Conjunctivae and EOM are normal.  Neck: Normal range of motion.  Cardiovascular: Normal rate and regular rhythm.  Exam reveals no gallop and no friction rub.   No murmur heard. Pulmonary/Chest: Effort normal and breath sounds normal. She has no wheezes. She has no rales. She exhibits no tenderness.  Abdominal: Soft. There is no tenderness.  Musculoskeletal: Normal range of motion.  Limited ROM of right shoulder due to pain. No obvious deformity. Generalized tenderness to palpation.   Neurological: She is alert and oriented to person, place, and time. Coordination normal.  Speech is goal-oriented. Moves limbs without ataxia.   Skin: Skin is warm and dry.  Psychiatric: She has a normal mood and affect. Her behavior is normal.  Nursing note and vitals reviewed.   ED Course  Procedures (including critical care time) Labs Review Labs Reviewed - No  data to display  Imaging Review Dg Shoulder Right  04/19/2015   CLINICAL DATA:  Acute onset of right shoulder pain. Heard right shoulder pop 2 days ago. Initial encounter.  EXAM: RIGHT SHOULDER - 2+ VIEW  COMPARISON:  None.  FINDINGS: There is no evidence of fracture or dislocation. The right humeral head is seated within the glenoid fossa. The acromioclavicular joint is unremarkable in appearance. No significant soft tissue abnormalities are seen. The visualized portions of the right lung are clear.  IMPRESSION: No evidence of fracture or dislocation.   Electronically Signed   By: Roanna Raider M.D.   On: 04/19/2015 01:26     EKG Interpretation None      MDM   Final diagnoses:  Right shoulder pain  Muscle spasm   1:33 AM Xray of shoulder unremarkable for acute changes. Vitals stable and patient afebrile.   2:44 AM Patient given IM toradol for pain. No neurovascular compromise.    Emilia Beck, PA-C 04/19/15 0245  Onalee Hua  Ranae Palms, MD 04/19/15 [phone]

## 2015-04-19 NOTE — ED Notes (Signed)
Pt in stating she was stretching a few days ago and heard a pop from her right shoulder, pain has gotten progressively worse since that time, worse with movement, starts ar shoulder and radiates to elbow

## 2015-04-21 DIAGNOSIS — N179 Acute kidney failure, unspecified: Secondary | ICD-10-CM

## 2015-04-21 HISTORY — DX: Acute kidney failure, unspecified: N17.9

## 2015-04-27 ENCOUNTER — Emergency Department (HOSPITAL_COMMUNITY): Payer: No Typology Code available for payment source

## 2015-04-27 ENCOUNTER — Emergency Department (HOSPITAL_COMMUNITY)
Admission: EM | Admit: 2015-04-27 | Discharge: 2015-04-27 | Disposition: A | Discharge status: Home / Self Care | Payer: No Typology Code available for payment source | Attending: Emergency Medicine | Admitting: Emergency Medicine

## 2015-04-27 ENCOUNTER — Encounter (HOSPITAL_COMMUNITY): Payer: Self-pay | Admitting: Emergency Medicine

## 2015-04-27 DIAGNOSIS — I10 Essential (primary) hypertension: Secondary | ICD-10-CM | POA: Diagnosis not present

## 2015-04-27 DIAGNOSIS — Z7952 Long term (current) use of systemic steroids: Secondary | ICD-10-CM | POA: Diagnosis not present

## 2015-04-27 DIAGNOSIS — E669 Obesity, unspecified: Secondary | ICD-10-CM | POA: Diagnosis not present

## 2015-04-27 DIAGNOSIS — E119 Type 2 diabetes mellitus without complications: Secondary | ICD-10-CM | POA: Diagnosis not present

## 2015-04-27 DIAGNOSIS — Z79899 Other long term (current) drug therapy: Secondary | ICD-10-CM | POA: Insufficient documentation

## 2015-04-27 DIAGNOSIS — L02413 Cutaneous abscess of right upper limb: Secondary | ICD-10-CM | POA: Diagnosis not present

## 2015-04-27 DIAGNOSIS — M25511 Pain in right shoulder: Secondary | ICD-10-CM | POA: Diagnosis present

## 2015-04-27 DIAGNOSIS — Z7982 Long term (current) use of aspirin: Secondary | ICD-10-CM | POA: Insufficient documentation

## 2015-04-27 DIAGNOSIS — Z794 Long term (current) use of insulin: Secondary | ICD-10-CM | POA: Insufficient documentation

## 2015-04-27 DIAGNOSIS — Z792 Long term (current) use of antibiotics: Secondary | ICD-10-CM | POA: Diagnosis not present

## 2015-04-27 DIAGNOSIS — J45909 Unspecified asthma, uncomplicated: Secondary | ICD-10-CM | POA: Diagnosis not present

## 2015-04-27 DIAGNOSIS — Z791 Long term (current) use of non-steroidal anti-inflammatories (NSAID): Secondary | ICD-10-CM | POA: Diagnosis not present

## 2015-04-27 LAB — CBC WITH DIFFERENTIAL/PLATELET
BASOS PCT: 0 % (ref 0–1)
Basophils Absolute: 0 10*3/uL (ref 0.0–0.1)
EOS PCT: 1 % (ref 0–5)
Eosinophils Absolute: 0.1 10*3/uL (ref 0.0–0.7)
HCT: 34.2 % — ABNORMAL LOW (ref 36.0–46.0)
HEMOGLOBIN: 11.5 g/dL — AB (ref 12.0–15.0)
LYMPHS ABS: 1.9 10*3/uL (ref 0.7–4.0)
Lymphocytes Relative: 14 % (ref 12–46)
MCH: 24.9 pg — ABNORMAL LOW (ref 26.0–34.0)
MCHC: 33.6 g/dL (ref 30.0–36.0)
MCV: 74 fL — AB (ref 78.0–100.0)
MONOS PCT: 8 % (ref 3–12)
Monocytes Absolute: 1.1 10*3/uL — ABNORMAL HIGH (ref 0.1–1.0)
Neutro Abs: 10.5 10*3/uL — ABNORMAL HIGH (ref 1.7–7.7)
Neutrophils Relative %: 77 % (ref 43–77)
PLATELETS: 377 10*3/uL (ref 150–400)
RBC: 4.62 MIL/uL (ref 3.87–5.11)
RDW: 13.9 % (ref 11.5–15.5)
WBC: 13.6 10*3/uL — AB (ref 4.0–10.5)

## 2015-04-27 LAB — BASIC METABOLIC PANEL
ANION GAP: 13 (ref 5–15)
BUN: 9 mg/dL (ref 6–20)
CO2: 21 mmol/L — ABNORMAL LOW (ref 22–32)
CREATININE: 0.76 mg/dL (ref 0.44–1.00)
Calcium: 8.9 mg/dL (ref 8.9–10.3)
Chloride: 92 mmol/L — ABNORMAL LOW (ref 101–111)
GFR calc non Af Amer: >60 mL/min (ref >60)
Glucose, Bld: 432 mg/dL — ABNORMAL HIGH (ref 70–99)
POTASSIUM: 4.8 mmol/L (ref 3.5–5.1)
Sodium: 126 mmol/L — ABNORMAL LOW (ref 135–145)

## 2015-04-27 LAB — SEDIMENTATION RATE: Sed Rate: 128 mm/hr — ABNORMAL HIGH (ref 0–22)

## 2015-04-27 MED ORDER — OXYCODONE-ACETAMINOPHEN 5-325 MG PO TABS
1.0000 | ORAL_TABLET | Freq: Once | ORAL | Status: AC
  Administered 2015-04-27: 1 via ORAL
  Filled 2015-04-27: qty 1

## 2015-04-27 MED ORDER — LIDOCAINE HCL 2 % IJ SOLN
10.0000 mL | Freq: Once | INTRAMUSCULAR | Status: AC
  Administered 2015-04-27: 200 mg
  Filled 2015-04-27: qty 20

## 2015-04-27 MED ORDER — MORPHINE SULFATE 4 MG/ML IJ SOLN
4.0000 mg | Freq: Once | INTRAMUSCULAR | Status: AC
  Administered 2015-04-27: 4 mg via INTRAVENOUS
  Filled 2015-04-27: qty 1

## 2015-04-27 MED ORDER — OXYCODONE-ACETAMINOPHEN 5-325 MG PO TABS: 1.0000 | ORAL_TABLET | ORAL | Status: DC | PRN

## 2015-04-27 MED ORDER — VANCOMYCIN HCL IN DEXTROSE 1-5 GM/200ML-% IV SOLN
1000.0000 mg | Freq: Once | INTRAVENOUS | Status: AC
  Administered 2015-04-27: 1000 mg via INTRAVENOUS
  Filled 2015-04-27: qty 200

## 2015-04-27 MED ORDER — DOXYCYCLINE HYCLATE 100 MG PO CAPS: 100.0000 mg | ORAL_CAPSULE | Freq: Two times a day (BID) | ORAL | Status: DC

## 2015-04-27 NOTE — ED Notes (Signed)
Patient returned from CT

## 2015-04-27 NOTE — ED Notes (Signed)
Dr. Glick at bedside.  

## 2015-04-27 NOTE — ED Provider Notes (Signed)
CSN: [id number]     Arrival date & time 04/27/15  0116 History   First MD Initiated Contact with Patient 04/27/15 0214     This chart was scribed for Dione Booze, MD by Arlan Organ, ED Scribe. This patient was seen in room A07C/A07C and the patient's care was started 2:17 AM.   Chief Complaint  Patient presents with  . Shoulder Pain   The history is provided by the patient. No language interpreter was used.   HPI Comments: Lindsay [name] is a 34 y.o. female with a PMHx of HTN and DM who presents to the Emergency Department complaining of constant, ongoing, unchanged R shoulder pain that radiates in to the R side of the neck x 10 days. Pt also reports R upper back pain and R arm pain. Currently she rates pain 10/10. Ms. Buckle has also noted a "knot" to the top of her R shoulder. Pt was evaluated for same 4/29 and was sent home with prescriptions for a muscle relaxant and pain medication. However, she denies any improvement with medications. In addition, no OTC medications attempted prior to arrival. No recent fever or chills. No numbness, loss of sensation, or paresthesia. [name] states she checks her blood sugars regularly at home with most recent highest reading at 225 after drinking a cup of orange juice. No known allergies to medications.  Past Medical History  Diagnosis Date  . Hypertension   . Diabetes mellitus   . Asthma   . Obesity    Past Surgical History  Procedure Laterality Date  . Cesarean section    . Tonsillectomy    . Leg surgery     Family History  Problem Relation Age of Onset  . Diabetes Mother   . Hypertension Mother   . Heart disease Mother   . Diabetes Father   . Heart disease Father   . Stroke Maternal Grandmother   . Cancer Maternal Grandmother    History  Substance Use Topics  . Smoking status: Never Smoker   . Smokeless tobacco: Not on file  . Alcohol Use: Yes     Comment: socially   OB History    No data available     Review of Systems   Constitutional: Negative for fever and chills.  Respiratory: Negative for cough and shortness of breath.   Gastrointestinal: Negative for nausea, vomiting and diarrhea.  Musculoskeletal: Positive for arthralgias and neck pain. Negative for joint swelling.  Skin: Negative for rash.  Neurological: Negative for weakness and numbness.  Psychiatric/Behavioral: Negative for confusion.      Allergies  Review of patient's allergies indicates no known allergies.  Home Medications   Prior to Admission medications   Medication Sig Start Date End Date Taking? Authorizing Provider  acetaminophen (TYLENOL) 500 MG tablet Take 1,000 mg by mouth every 6 (six) hours as needed for moderate pain.    Historical Provider, MD  aspirin 81 MG tablet Take 81 mg by mouth daily.    Historical Provider, MD  cephALEXin (KEFLEX) 500 MG capsule Take 1 capsule (500 mg total) by mouth 4 (four) times daily. 04/03/15   Roxy Horseman, PA-C  clindamycin (CLEOCIN) 150 MG capsule Take 3 capsules (450 mg total) by mouth 3 (three) times daily. 12/14/14   Hannah Muthersbaugh, PA-C  cyclobenzaprine (FLEXERIL) 10 MG tablet Take 1 tablet (10 mg total) by mouth 2 (two) times daily as needed for muscle spasms. 04/19/15   Kaitlyn Szekalski, PA-C  gabapentin (NEURONTIN) 300 MG capsule Take  1 capsule (300 mg total) by mouth 3 (three) times daily. 02/13/15   Doris Cheadle, MD  HYDROcodone-acetaminophen (NORCO/VICODIN) 5-325 MG per tablet Take 1-2 tablets by mouth every 6 (six) hours as needed. 04/03/15   Roxy Horseman, PA-C  hydrocortisone cream 1 % Apply to affected area 2 times daily 10/10/14   Ladona Mow, PA-C  insulin aspart (NOVOLOG) 100 UNIT/ML FlexPen Inject 20 units with breakfast and lunch, and 25 units before supper daily, subcutaneous 02/13/15   Doris Cheadle, MD  insulin glargine (LANTUS) 100 unit/mL SOPN Inject 0.65 mLs (65 Units total) into the skin at bedtime. 02/13/15   Doris Cheadle, MD  lisinopril (PRINIVIL,ZESTRIL) 5 MG  tablet Take 1 tablet (5 mg total) by mouth daily. 02/13/15   Doris Cheadle, MD  meloxicam (MOBIC) 15 MG tablet Take 1 tablet (15 mg total) by mouth daily. 04/19/15   Kaitlyn Szekalski, PA-C  metFORMIN (GLUCOPHAGE) 1000 MG tablet Take 1 tablet (1,000 mg total) by mouth 2 (two) times daily with a meal. 02/13/15   Doris Cheadle, MD  Neomycin-Bacitracin-Polymyxin (NEOSPORIN EX) Apply 1 application topically daily as needed (for bites).    Historical Provider, MD  oxyCODONE-acetaminophen (PERCOCET) 5-325 MG per tablet Take 1 tablet by mouth every 4 (four) hours as needed. 12/14/14   Hannah Muthersbaugh, PA-C  sulfamethoxazole-trimethoprim (BACTRIM DS,SEPTRA DS) 800-160 MG per tablet Take 1 tablet by mouth 2 (two) times daily. Patient not taking: Reported on 12/14/2014 10/10/14   Ladona Mow, PA-C  Vitamin D, Ergocalciferol, (DRISDOL) 50000 UNITS CAPS capsule Take 1 capsule (50,000 Units total) by mouth every 7 (seven) days. 07/18/14   Doris Cheadle, MD   Triage Vitals: BP 163/91 mmHg  Pulse 115  Temp(Src) 98.2 F (36.8 C) (Oral)  Resp 18  SpO2 96%  LMP 04/23/2015   Physical Exam  Constitutional: She is oriented to person, place, and time. She appears well-developed and well-nourished. No distress.  Obese   HENT:  Head: Normocephalic and atraumatic.  Eyes: EOM are normal. Pupils are equal, round, and reactive to light.  Neck: Normal range of motion. Neck supple. No JVD present.  Cardiovascular: Normal rate, regular rhythm and normal heart sounds.   No murmur heard. Pulmonary/Chest: Effort normal and breath sounds normal. She has no wheezes. She has no rales. She exhibits no tenderness.  Abdominal: Soft. Bowel sounds are normal. She exhibits no distension and no mass. There is no tenderness.  Musculoskeletal: She exhibits tenderness. She exhibits no edema.  R shoulder has 6 x 7 cm area of induration and mild erythema and is exquisitely tender Pain on any ROM of the R shoulder   Lymphadenopathy:     She has no cervical adenopathy.  Neurological: She is alert and oriented to person, place, and time. No cranial nerve deficit. She exhibits normal muscle tone. Coordination normal.  Distal neurovascular exam is intact  Skin: Skin is warm and dry. No rash noted.  Psychiatric: She has a normal mood and affect. Her behavior is normal. Judgment and thought content normal.  Nursing note and vitals reviewed.   ED Course  Procedures (including critical care time)  DIAGNOSTIC STUDIES: Oxygen Saturation is 96% on RA, adequate by my interpretation.    Limited bedside ultrasound Indication: Possible abscess Location: Right shoulder Findings: Several pockets of decreased echogenicity consistent with collections of pus Images were archived electronically. Patient tolerated procedure well.  COORDINATION OF CARE: 2:25 AM- Will give Morphine. Will order CT Shoulder R without contrast, BMP, CBC, and sedimentation rate. Discussed treatment  plan with pt at bedside and pt agreed to plan.    INCISION AND DRAINAGE Performed by: [email or web address],UEAVW Consent: Verbal consent obtained. Risks and benefits: risks, benefits and alternatives were discussed Type: abscess  Body area: Right shoulder  Anesthesia: local infiltration  Incision was made with a scalpel.  Local anesthetic: lidocaine 2% without epinephrine  Anesthetic total: 5 ml  Complexity: complex Blunt dissection to break up loculations  Drainage: purulent  Drainage amount: Moderate   Packing material: None   Patient tolerance: Patient tolerated the procedure well with no immediate complications. following procedure, the area that was indurated intense is now soft and no longer indurated.       Labs Review Results for orders placed or performed during the hospital encounter of 04/27/15  Basic metabolic panel  Result Value Ref Range   Sodium 126 (L) 135 - 145 mmol/L   Potassium 4.8 3.5 - 5.1 mmol/L   Chloride 92 (L) 101 - 111 mmol/L    CO2 21 (L) 22 - 32 mmol/L   Glucose, Bld 432 (H) 70 - 99 mg/dL   BUN 9 6 - 20 mg/dL   Creatinine, Ser 0.98 0.44 - 1.00 mg/dL   Calcium 8.9 8.9 - 11.9 mg/dL   GFR calc non Af Amer >60 >60 mL/min   GFR calc Af Amer >60 >60 mL/min   Anion gap 13 5 - 15  CBC with Differential  Result Value Ref Range   WBC 13.6 (H) 4.0 - 10.5 K/uL   RBC 4.62 3.87 - 5.11 MIL/uL   Hemoglobin 11.5 (L) 12.0 - 15.0 g/dL   HCT 14.7 (L) 82.9 - 56.2 %   MCV 74.0 (L) 78.0 - 100.0 fL   MCH 24.9 (L) 26.0 - 34.0 pg   MCHC 33.6 30.0 - 36.0 g/dL   RDW 13.0 86.5 - 78.4 %   Platelets 377 150 - 400 K/uL   Neutrophils Relative % 77 43 - 77 %   Lymphocytes Relative 14 12 - 46 %   Monocytes Relative 8 3 - 12 %   Eosinophils Relative 1 0 - 5 %   Basophils Relative 0 0 - 1 %   Neutro Abs 10.5 (H) 1.7 - 7.7 K/uL   Lymphs Abs 1.9 0.7 - 4.0 K/uL   Monocytes Absolute 1.1 (H) 0.1 - 1.0 K/uL   Eosinophils Absolute 0.1 0.0 - 0.7 K/uL   Basophils Absolute 0.0 0.0 - 0.1 K/uL   Smear Review MORPHOLOGY UNREMARKABLE   Sedimentation rate  Result Value Ref Range   Sed Rate 128 (H) 0 - 22 mm/hr   Imaging Review Ct Shoulder Right Wo Contrast  04/27/2015   CLINICAL DATA:  Constant right shoulder pain for 10 days  EXAM: CT OF THE RIGHT SHOULDER WITHOUT CONTRAST  TECHNIQUE: Multidetector CT imaging was performed according to the standard protocol. Multiplanar CT image reconstructions were also generated.  COMPARISON:  Radiographs 04/19/2015  FINDINGS: There is no fracture or dislocation. There is no bone lesion. There is mild osteolysis of the distal clavicle. This can be seen after trauma or with inflammatory arthropathies. It can be idiopathic. No soft tissue mass is evident. There is no abnormal fluid collection. Visible portions of the right ribs and chest wall appear unremarkable.  IMPRESSION: Osteolysis of the distal right clavicle, uncertain chronicity.   Electronically Signed   By: Ellery Plunk M.D.   On: 04/27/2015 03:43    Images viewed by me.   MDM   Final diagnoses:  Abscess of right  shoulder    Right shoulder pain with what appears to be developing abscess. Limited bedside ultrasound doesn't appear to show some pockets of fluid collection. Pain seems out of Proportion to the amount of inflammation I sees so she is sent for CT of the shoulder to look for evidence of involvement of the joint. CT has come back showing no evidence of abnormal fluid collection. The area is incised and drained and a moderate amount of pus is obtained and it does seems to be tracking along fascial planes. WBC is moderately elevated but without a left shift. Sedimentation rate is fairly high. She is given a dose of vancomycin and is sent home with prescription for doxycycline and she is to follow-up with orthopedics in 2 days. I did discuss the case with Dr. Carola Frost, on call for orthopedics, who agrees with this approach and states he will see her in his office in 2 days. Abscess culture was sent.  I personally performed the services described in this documentation, which was scribed in my presence. The recorded information has been reviewed and is accurate.       Dione Booze, MD 04/27/15 [phone]

## 2015-04-27 NOTE — ED Notes (Signed)
C/o R shoulder pain that radiates down R arm.  States she was seen in ED on 4/29 for same and pain has not improved.

## 2015-04-27 NOTE — Discharge Instructions (Signed)
Abscess An abscess is an infected area that contains a collection of pus and debris.It can occur in almost any part of the body. An abscess is also known as a furuncle or boil. CAUSES  An abscess occurs when tissue gets infected. This can occur from blockage of oil or sweat glands, infection of hair follicles, or a minor injury to the skin. As the body tries to fight the infection, pus collects in the area and creates pressure under the skin. This pressure causes pain. People with weakened immune systems have difficulty fighting infections and get certain abscesses more often.  SYMPTOMS Usually an abscess develops on the skin and becomes a painful mass that is red, warm, and tender. If the abscess forms under the skin, you may feel a moveable soft area under the skin. Some abscesses break open (rupture) on their own, but most will continue to get worse without care. The infection can spread deeper into the body and eventually into the bloodstream, causing you to feel ill.  DIAGNOSIS  Your caregiver will take your medical history and perform a physical exam. A sample of fluid may also be taken from the abscess to determine what is causing your infection. TREATMENT  Your caregiver may prescribe antibiotic medicines to fight the infection. However, taking antibiotics alone usually does not cure an abscess. Your caregiver may need to make a small cut (incision) in the abscess to drain the pus. In some cases, gauze is packed into the abscess to reduce pain and to continue draining the area. HOME CARE INSTRUCTIONS   Only take over-the-counter or prescription medicines for pain, discomfort, or fever as directed by your caregiver.  If you were prescribed antibiotics, take them as directed. Finish them even if you start to feel better.  If gauze is used, follow your caregiver's directions for changing the gauze.  To avoid spreading the infection:  Keep your draining abscess covered with a  bandage.  Wash your hands well.  Do not share personal care items, towels, or whirlpools with others.  Avoid skin contact with others.  Keep your skin and clothes clean around the abscess.  Keep all follow-up appointments as directed by your caregiver. SEEK MEDICAL CARE IF:   You have increased pain, swelling, redness, fluid drainage, or bleeding.  You have muscle aches, chills, or a general ill feeling.  You have a fever. MAKE SURE YOU:   Understand these instructions.  Will watch your condition.  Will get help right away if you are not doing well or get worse. Document Released: 09/16/2005 Document Revised: 06/07/2012 Document Reviewed: 02/19/2012 [facility] Patient Information 2015 [place], [place]. This information is not intended to replace advice given to you by your health care provider. Make sure you discuss any questions you have with your health care provider.  Doxycycline tablets or capsules What is this medicine? DOXYCYCLINE (dox i SYE kleen) is a tetracycline antibiotic. It kills certain bacteria or stops their growth. It is used to treat many kinds of infections, like dental, skin, respiratory, and urinary tract infections. It also treats acne, Lyme disease, malaria, and certain sexually transmitted infections. This medicine may be used for other purposes; ask your health care provider or pharmacist if you have questions. COMMON BRAND NAME(S): Acticlate, Adoxa, Adoxa CK, Adoxa Pak, Adoxa TT, Alodox, Avidoxy, Doxal, Monodox, Morgidox 1x, Morgidox 1x Kit, Morgidox 2x, Morgidox 2x Kit, Ocudox, Vibra-Tabs, Vibramycin What should I tell my health care provider before I take this medicine? They need to know if  you have any of these conditions: -liver disease -long exposure to sunlight like working outdoors -stomach problems like colitis -an unusual or allergic reaction to doxycycline, tetracycline antibiotics, other medicines, foods, dyes, or preservatives -pregnant or  trying to get pregnant -breast-feeding How should I use this medicine? Take this medicine by mouth with a full glass of water. Follow the directions on the prescription label. It is best to take this medicine without food, but if it upsets your stomach take it with food. Take your medicine at regular intervals. Do not take your medicine more often than directed. Take all of your medicine as directed even if you think you are better. Do not skip doses or stop your medicine early. Talk to your pediatrician regarding the use of this medicine in children. Special care may be needed. While this drug may be prescribed for children as young as 8 years old for selected conditions, precautions do apply. Overdosage: If you think you have taken too much of this medicine contact a poison control center or emergency room at once. NOTE: This medicine is only for you. Do not share this medicine with others. What if I miss a dose? If you miss a dose, take it as soon as you can. If it is almost time for your next dose, take only that dose. Do not take double or extra doses. What may interact with this medicine? -antacids -barbiturates -birth control pills -bismuth subsalicylate -carbamazepine -methoxyflurane -other antibiotics -phenytoin -vitamins that contain iron -warfarin This list may not describe all possible interactions. Give your health care provider a list of all the medicines, herbs, non-prescription drugs, or dietary supplements you use. Also tell them if you smoke, drink alcohol, or use illegal drugs. Some items may interact with your medicine. What should I watch for while using this medicine? Tell your doctor or health care professional if your symptoms do not improve. Do not treat diarrhea with over the counter products. Contact your doctor if you have diarrhea that lasts more than 2 days or if it is severe and watery. Do not take this medicine just before going to bed. It may not dissolve  properly when you lay down and can cause pain in your throat. Drink plenty of fluids while taking this medicine to also help reduce irritation in your throat. This medicine can make you more sensitive to the sun. Keep out of the sun. If you cannot avoid being in the sun, wear protective clothing and use sunscreen. Do not use sun lamps or tanning beds/booths. Birth control pills may not work properly while you are taking this medicine. Talk to your doctor about using an extra method of birth control. If you are being treated for a sexually transmitted infection, avoid sexual contact until you have finished your treatment. Your sexual partner may also need treatment. Avoid antacids, aluminum, calcium, magnesium, and iron products for 4 hours before and 2 hours after taking a dose of this medicine. If you are using this medicine to prevent malaria, you should still protect yourself from contact with mosquitos. Stay in screened-in areas, use mosquito nets, keep your body covered, and use an insect repellent. What side effects may I notice from receiving this medicine? Side effects that you should report to your doctor or health care professional as soon as possible: -allergic reactions like skin rash, itching or hives, swelling of the face, lips, or tongue -difficulty breathing -fever -itching in the rectal or genital area -pain on swallowing -redness, blistering, peeling or  loosening of the skin, including inside the mouth -severe stomach pain or cramps -unusual bleeding or bruising -unusually weak or tired -yellowing of the eyes or skin Side effects that usually do not require medical attention (report to your doctor or health care professional if they continue or are bothersome): -diarrhea -loss of appetite -nausea, vomiting This list may not describe all possible side effects. Call your doctor for medical advice about side effects. You may report side effects to FDA at 1-800-FDA-1088. Where  should I keep my medicine? Keep out of the reach of children. Store at room temperature, below 30 degrees C (86 degrees F). Protect from light. Keep container tightly closed. Throw away any unused medicine after the expiration date. Taking this medicine after the expiration date can make you seriously ill. NOTE: This sheet is a summary. It may not cover all possible information. If you have questions about this medicine, talk to your doctor, pharmacist, or health care provider.  2015, [employer]. (2013-10-13 13:58:06)  Acetaminophen; Oxycodone tablets What is this medicine? ACETAMINOPHEN; OXYCODONE (a set a MEE noe fen; ox i KOE done) is a pain reliever. It is used to treat mild to moderate pain. This medicine may be used for other purposes; ask your health care provider or pharmacist if you have questions. COMMON BRAND NAME(S): Endocet, Magnacet, Narvox, Percocet, Perloxx, Primalev, Primlev, Roxicet, Xolox What should I tell my health care provider before I take this medicine? They need to know if you have any of these conditions: -brain tumor -Crohn's disease, inflammatory bowel disease, or ulcerative colitis -drug abuse or addiction -head injury -heart or circulation problems -if you often drink alcohol -kidney disease or problems going to the bathroom -liver disease -lung disease, asthma, or breathing problems -an unusual or allergic reaction to acetaminophen, oxycodone, other opioid analgesics, other medicines, foods, dyes, or preservatives -pregnant or trying to get pregnant -breast-feeding How should I use this medicine? Take this medicine by mouth with a full glass of water. Follow the directions on the prescription label. Take your medicine at regular intervals. Do not take your medicine more often than directed. Talk to your pediatrician regarding the use of this medicine in children. Special care may be needed. Patients over 66 years old may have a stronger  reaction and need a smaller dose. Overdosage: If you think you have taken too much of this medicine contact a poison control center or emergency room at once. NOTE: This medicine is only for you. Do not share this medicine with others. What if I miss a dose? If you miss a dose, take it as soon as you can. If it is almost time for your next dose, take only that dose. Do not take double or extra doses. What may interact with this medicine? -alcohol -antihistamines -barbiturates like amobarbital, butalbital, butabarbital, methohexital, pentobarbital, phenobarbital, thiopental, and secobarbital -benztropine -drugs for bladder problems like solifenacin, trospium, oxybutynin, tolterodine, hyoscyamine, and methscopolamine -drugs for breathing problems like ipratropium and tiotropium -drugs for certain stomach or intestine problems like propantheline, homatropine methylbromide, glycopyrrolate, atropine, belladonna, and dicyclomine -general anesthetics like etomidate, ketamine, nitrous oxide, propofol, desflurane, enflurane, halothane, isoflurane, and sevoflurane -medicines for depression, anxiety, or psychotic disturbances -medicines for sleep -muscle relaxants -naltrexone -narcotic medicines (opiates) for pain -phenothiazines like perphenazine, thioridazine, chlorpromazine, mesoridazine, fluphenazine, prochlorperazine, promazine, and trifluoperazine -scopolamine -tramadol -trihexyphenidyl This list may not describe all possible interactions. Give your health care provider a list of all the medicines, herbs, non-prescription drugs, or dietary supplements you use. Also  tell them if you smoke, drink alcohol, or use illegal drugs. Some items may interact with your medicine. What should I watch for while using this medicine? Tell your doctor or health care professional if your pain does not go away, if it gets worse, or if you have new or a different type of pain. You may develop tolerance to the  medicine. Tolerance means that you will need a higher dose of the medication for pain relief. Tolerance is normal and is expected if you take this medicine for a long time. Do not suddenly stop taking your medicine because you may develop a severe reaction. Your body becomes used to the medicine. This does NOT mean you are addicted. Addiction is a behavior related to getting and using a drug for a non-medical reason. If you have pain, you have a medical reason to take pain medicine. Your doctor will tell you how much medicine to take. If your doctor wants you to stop the medicine, the dose will be slowly lowered over time to avoid any side effects. You may get drowsy or dizzy. Do not drive, use machinery, or do anything that needs mental alertness until you know how this medicine affects you. Do not stand or sit up quickly, especially if you are an older patient. This reduces the risk of dizzy or fainting spells. Alcohol may interfere with the effect of this medicine. Avoid alcoholic drinks. There are different types of narcotic medicines (opiates) for pain. If you take more than one type at the same time, you may have more side effects. Give your health care provider a list of all medicines you use. Your doctor will tell you how much medicine to take. Do not take more medicine than directed. Call emergency for help if you have problems breathing. The medicine will cause constipation. Try to have a bowel movement at least every 2 to 3 days. If you do not have a bowel movement for 3 days, call your doctor or health care professional. Do not take Tylenol (acetaminophen) or medicines that have acetaminophen with this medicine. Too much acetaminophen can be very dangerous. Many nonprescription medicines contain acetaminophen. Always read the labels carefully to avoid taking more acetaminophen. What side effects may I notice from receiving this medicine? Side effects that you should report to your doctor or health  care professional as soon as possible: -allergic reactions like skin rash, itching or hives, swelling of the face, lips, or tongue -breathing difficulties, wheezing -confusion -light headedness or fainting spells -severe stomach pain -unusually weak or tired -yellowing of the skin or the whites of the eyes Side effects that usually do not require medical attention (report to your doctor or health care professional if they continue or are bothersome): -dizziness -drowsiness -nausea -vomiting This list may not describe all possible side effects. Call your doctor for medical advice about side effects. You may report side effects to FDA at 1-800-FDA-1088. Where should I keep my medicine? Keep out of the reach of children. This medicine can be abused. Keep your medicine in a safe place to protect it from theft. Do not share this medicine with anyone. Selling or giving away this medicine is dangerous and against the law. Store at room temperature between 20 and 25 degrees C (68 and 77 degrees F). Keep container tightly closed. Protect from light. This medicine may cause accidental overdose and death if it is taken by other adults, children, or pets. Flush any unused medicine down the toilet to reduce  the chance of harm. Do not use the medicine after the expiration date. NOTE: This sheet is a summary. It may not cover all possible information. If you have questions about this medicine, talk to your doctor, pharmacist, or health care provider.  2015, [employer]. (2013-07-31 13:17:35)

## 2015-04-27 NOTE — ED Notes (Signed)
Pt. Left with all belongings 

## 2015-04-29 ENCOUNTER — Encounter (HOSPITAL_COMMUNITY): Payer: Self-pay

## 2015-04-29 DIAGNOSIS — B372 Candidiasis of skin and nail: Secondary | ICD-10-CM | POA: Diagnosis present

## 2015-04-29 DIAGNOSIS — E1142 Type 2 diabetes mellitus with diabetic polyneuropathy: Secondary | ICD-10-CM | POA: Diagnosis present

## 2015-04-29 DIAGNOSIS — E1165 Type 2 diabetes mellitus with hyperglycemia: Secondary | ICD-10-CM | POA: Diagnosis present

## 2015-04-29 DIAGNOSIS — Z6841 Body Mass Index (BMI) 40.0 and over, adult: Secondary | ICD-10-CM

## 2015-04-29 DIAGNOSIS — L02213 Cutaneous abscess of chest wall: Secondary | ICD-10-CM | POA: Diagnosis present

## 2015-04-29 DIAGNOSIS — Z833 Family history of diabetes mellitus: Secondary | ICD-10-CM

## 2015-04-29 DIAGNOSIS — A419 Sepsis, unspecified organism: Secondary | ICD-10-CM | POA: Diagnosis not present

## 2015-04-29 DIAGNOSIS — E43 Unspecified severe protein-calorie malnutrition: Secondary | ICD-10-CM | POA: Diagnosis present

## 2015-04-29 DIAGNOSIS — Z79899 Other long term (current) drug therapy: Secondary | ICD-10-CM

## 2015-04-29 DIAGNOSIS — D649 Anemia, unspecified: Secondary | ICD-10-CM | POA: Diagnosis present

## 2015-04-29 DIAGNOSIS — E871 Hypo-osmolality and hyponatremia: Secondary | ICD-10-CM | POA: Diagnosis present

## 2015-04-29 DIAGNOSIS — Z7982 Long term (current) use of aspirin: Secondary | ICD-10-CM

## 2015-04-29 DIAGNOSIS — M009 Pyogenic arthritis, unspecified: Secondary | ICD-10-CM | POA: Diagnosis present

## 2015-04-29 DIAGNOSIS — J45909 Unspecified asthma, uncomplicated: Secondary | ICD-10-CM | POA: Diagnosis present

## 2015-04-29 DIAGNOSIS — M609 Myositis, unspecified: Secondary | ICD-10-CM | POA: Diagnosis present

## 2015-04-29 DIAGNOSIS — L03113 Cellulitis of right upper limb: Secondary | ICD-10-CM | POA: Diagnosis present

## 2015-04-29 DIAGNOSIS — R600 Localized edema: Secondary | ICD-10-CM | POA: Diagnosis not present

## 2015-04-29 DIAGNOSIS — L02413 Cutaneous abscess of right upper limb: Secondary | ICD-10-CM | POA: Diagnosis present

## 2015-04-29 DIAGNOSIS — M25511 Pain in right shoulder: Secondary | ICD-10-CM | POA: Diagnosis not present

## 2015-04-29 DIAGNOSIS — E872 Acidosis: Secondary | ICD-10-CM | POA: Diagnosis present

## 2015-04-29 DIAGNOSIS — Z794 Long term (current) use of insulin: Secondary | ICD-10-CM

## 2015-04-29 DIAGNOSIS — I10 Essential (primary) hypertension: Secondary | ICD-10-CM | POA: Diagnosis present

## 2015-04-29 DIAGNOSIS — G9340 Encephalopathy, unspecified: Secondary | ICD-10-CM | POA: Diagnosis present

## 2015-04-29 DIAGNOSIS — N170 Acute kidney failure with tubular necrosis: Secondary | ICD-10-CM | POA: Diagnosis present

## 2015-04-29 NOTE — ED Notes (Signed)
Pt seen Friday for abscess in shoulder was supposed to see specialist today and no improvement, unable to get appt with specialist, swelling in legs and feet per patient. Arm hurts to move and raise now.

## 2015-04-30 ENCOUNTER — Inpatient Hospital Stay (HOSPITAL_COMMUNITY): Payer: No Typology Code available for payment source | Admitting: Anesthesiology

## 2015-04-30 ENCOUNTER — Encounter (HOSPITAL_COMMUNITY): Payer: Self-pay | Admitting: Internal Medicine

## 2015-04-30 ENCOUNTER — Inpatient Hospital Stay (HOSPITAL_COMMUNITY): Payer: No Typology Code available for payment source

## 2015-04-30 ENCOUNTER — Encounter (HOSPITAL_COMMUNITY)
Admission: EM | Disposition: A | Discharge status: Home Health | Payer: Self-pay | Source: Home / Self Care | Attending: Internal Medicine

## 2015-04-30 ENCOUNTER — Inpatient Hospital Stay (HOSPITAL_COMMUNITY)
Admission: EM | Admit: 2015-04-30 | Discharge: 2015-05-14 | DRG: 853 | Disposition: A | Discharge status: Home Health | Payer: No Typology Code available for payment source | Attending: Internal Medicine | Admitting: Internal Medicine

## 2015-04-30 DIAGNOSIS — B9689 Other specified bacterial agents as the cause of diseases classified elsewhere: Secondary | ICD-10-CM | POA: Diagnosis not present

## 2015-04-30 DIAGNOSIS — A480 Gas gangrene: Secondary | ICD-10-CM | POA: Insufficient documentation

## 2015-04-30 DIAGNOSIS — Z6841 Body Mass Index (BMI) 40.0 and over, adult: Secondary | ICD-10-CM | POA: Diagnosis not present

## 2015-04-30 DIAGNOSIS — E669 Obesity, unspecified: Secondary | ICD-10-CM | POA: Diagnosis not present

## 2015-04-30 DIAGNOSIS — L03113 Cellulitis of right upper limb: Secondary | ICD-10-CM | POA: Diagnosis not present

## 2015-04-30 DIAGNOSIS — M009 Pyogenic arthritis, unspecified: Secondary | ICD-10-CM | POA: Diagnosis present

## 2015-04-30 DIAGNOSIS — E1165 Type 2 diabetes mellitus with hyperglycemia: Secondary | ICD-10-CM | POA: Diagnosis present

## 2015-04-30 DIAGNOSIS — L02413 Cutaneous abscess of right upper limb: Secondary | ICD-10-CM

## 2015-04-30 DIAGNOSIS — M25511 Pain in right shoulder: Secondary | ICD-10-CM | POA: Diagnosis present

## 2015-04-30 DIAGNOSIS — B372 Candidiasis of skin and nail: Secondary | ICD-10-CM | POA: Diagnosis present

## 2015-04-30 DIAGNOSIS — L03114 Cellulitis of left upper limb: Secondary | ICD-10-CM | POA: Diagnosis not present

## 2015-04-30 DIAGNOSIS — E871 Hypo-osmolality and hyponatremia: Secondary | ICD-10-CM

## 2015-04-30 DIAGNOSIS — L02213 Cutaneous abscess of chest wall: Secondary | ICD-10-CM | POA: Diagnosis present

## 2015-04-30 DIAGNOSIS — R739 Hyperglycemia, unspecified: Secondary | ICD-10-CM

## 2015-04-30 DIAGNOSIS — L0291 Cutaneous abscess, unspecified: Secondary | ICD-10-CM

## 2015-04-30 DIAGNOSIS — R600 Localized edema: Secondary | ICD-10-CM | POA: Diagnosis not present

## 2015-04-30 DIAGNOSIS — E1142 Type 2 diabetes mellitus with diabetic polyneuropathy: Secondary | ICD-10-CM | POA: Diagnosis present

## 2015-04-30 DIAGNOSIS — M609 Myositis, unspecified: Secondary | ICD-10-CM | POA: Diagnosis not present

## 2015-04-30 DIAGNOSIS — L0390 Cellulitis, unspecified: Secondary | ICD-10-CM | POA: Insufficient documentation

## 2015-04-30 DIAGNOSIS — N179 Acute kidney failure, unspecified: Secondary | ICD-10-CM | POA: Diagnosis not present

## 2015-04-30 DIAGNOSIS — N170 Acute kidney failure with tubular necrosis: Secondary | ICD-10-CM | POA: Diagnosis present

## 2015-04-30 DIAGNOSIS — R748 Abnormal levels of other serum enzymes: Secondary | ICD-10-CM | POA: Diagnosis not present

## 2015-04-30 DIAGNOSIS — E119 Type 2 diabetes mellitus without complications: Secondary | ICD-10-CM | POA: Diagnosis not present

## 2015-04-30 DIAGNOSIS — G9340 Encephalopathy, unspecified: Secondary | ICD-10-CM | POA: Diagnosis present

## 2015-04-30 DIAGNOSIS — E43 Unspecified severe protein-calorie malnutrition: Secondary | ICD-10-CM | POA: Diagnosis present

## 2015-04-30 DIAGNOSIS — M7989 Other specified soft tissue disorders: Secondary | ICD-10-CM

## 2015-04-30 DIAGNOSIS — Z7982 Long term (current) use of aspirin: Secondary | ICD-10-CM | POA: Diagnosis not present

## 2015-04-30 DIAGNOSIS — R202 Paresthesia of skin: Secondary | ICD-10-CM

## 2015-04-30 DIAGNOSIS — E089 Diabetes mellitus due to underlying condition without complications: Secondary | ICD-10-CM | POA: Diagnosis not present

## 2015-04-30 DIAGNOSIS — I10 Essential (primary) hypertension: Secondary | ICD-10-CM

## 2015-04-30 DIAGNOSIS — J45909 Unspecified asthma, uncomplicated: Secondary | ICD-10-CM | POA: Diagnosis present

## 2015-04-30 DIAGNOSIS — E139 Other specified diabetes mellitus without complications: Secondary | ICD-10-CM

## 2015-04-30 DIAGNOSIS — E872 Acidosis: Secondary | ICD-10-CM | POA: Diagnosis present

## 2015-04-30 DIAGNOSIS — Z833 Family history of diabetes mellitus: Secondary | ICD-10-CM | POA: Diagnosis not present

## 2015-04-30 DIAGNOSIS — A419 Sepsis, unspecified organism: Secondary | ICD-10-CM | POA: Diagnosis present

## 2015-04-30 DIAGNOSIS — R200 Anesthesia of skin: Secondary | ICD-10-CM

## 2015-04-30 DIAGNOSIS — Z794 Long term (current) use of insulin: Secondary | ICD-10-CM | POA: Diagnosis not present

## 2015-04-30 DIAGNOSIS — D649 Anemia, unspecified: Secondary | ICD-10-CM | POA: Diagnosis present

## 2015-04-30 DIAGNOSIS — Z79899 Other long term (current) drug therapy: Secondary | ICD-10-CM | POA: Diagnosis not present

## 2015-04-30 DIAGNOSIS — M00011 Staphylococcal arthritis, right shoulder: Secondary | ICD-10-CM | POA: Diagnosis not present

## 2015-04-30 HISTORY — PX: IRRIGATION AND DEBRIDEMENT SHOULDER: SHX5880

## 2015-04-30 HISTORY — PX: SHOULDER ARTHROSCOPY: SHX128

## 2015-04-30 LAB — COMPREHENSIVE METABOLIC PANEL
ALT: 14 U/L (ref 14–54)
AST: 19 U/L (ref 15–41)
Albumin: 1.9 g/dL — ABNORMAL LOW (ref 3.5–5.0)
Alkaline Phosphatase: 170 U/L — ABNORMAL HIGH (ref 38–126)
Anion gap: 12 (ref 5–15)
BUN: 18 mg/dL (ref 6–20)
CALCIUM: 8.3 mg/dL — AB (ref 8.9–10.3)
CO2: 23 mmol/L (ref 22–32)
Chloride: 86 mmol/L — ABNORMAL LOW (ref 101–111)
Creatinine, Ser: 1.05 mg/dL — ABNORMAL HIGH (ref 0.44–1.00)
GFR calc Af Amer: >60 mL/min (ref >60)
GFR calc non Af Amer: >60 mL/min (ref >60)
Glucose, Bld: 438 mg/dL — ABNORMAL HIGH (ref 70–99)
POTASSIUM: 4.2 mmol/L (ref 3.5–5.1)
Sodium: 121 mmol/L — ABNORMAL LOW (ref 135–145)
TOTAL PROTEIN: 7.4 g/dL (ref 6.5–8.1)
Total Bilirubin: 0.7 mg/dL (ref 0.3–1.2)

## 2015-04-30 LAB — URINE MICROSCOPIC-ADD ON

## 2015-04-30 LAB — CBC WITH DIFFERENTIAL/PLATELET
BASOS ABS: 0 10*3/uL (ref 0.0–0.1)
BASOS PCT: 0 % (ref 0–1)
Band Neutrophils: 0 % (ref 0–10)
Blasts: 0 %
EOS PCT: 0 % (ref 0–5)
Eosinophils Absolute: 0 10*3/uL (ref 0.0–0.7)
HCT: 32.5 % — ABNORMAL LOW (ref 36.0–46.0)
Hemoglobin: 10.7 g/dL — ABNORMAL LOW (ref 12.0–15.0)
LYMPHS PCT: 0 % — AB (ref 12–46)
Lymphs Abs: 0 10*3/uL — ABNORMAL LOW (ref 0.7–4.0)
MCH: 24.7 pg — ABNORMAL LOW (ref 26.0–34.0)
MCHC: 32.9 g/dL (ref 30.0–36.0)
MCV: 75.1 fL — ABNORMAL LOW (ref 78.0–100.0)
METAMYELOCYTES PCT: 0 %
MONOS PCT: 0 % — AB (ref 3–12)
MYELOCYTES: 0 %
Monocytes Absolute: 0 10*3/uL — ABNORMAL LOW (ref 0.1–1.0)
NEUTROS PCT: 0 % — AB (ref 43–77)
Neutro Abs: 0 10*3/uL — ABNORMAL LOW (ref 1.7–7.7)
PLATELETS: 353 10*3/uL (ref 150–400)
Promyelocytes Absolute: 0 %
RBC: 4.33 MIL/uL (ref 3.87–5.11)
RDW: 14.1 % (ref 11.5–15.5)
WBC: 18.2 10*3/uL — ABNORMAL HIGH (ref 4.0–10.5)
nRBC: 0 /100 WBC

## 2015-04-30 LAB — URINALYSIS, ROUTINE W REFLEX MICROSCOPIC
Glucose, UA: >1000 mg/dL — AB
KETONES UR: 15 mg/dL — AB
NITRITE: NEGATIVE
Protein, ur: 100 mg/dL — AB
SPECIFIC GRAVITY, URINE: 1.027 (ref 1.005–1.030)
UROBILINOGEN UA: 1 mg/dL (ref 0.0–1.0)
pH: 5 (ref 5.0–8.0)

## 2015-04-30 LAB — GLUCOSE, CAPILLARY
GLUCOSE-CAPILLARY: 290 mg/dL — AB (ref 70–99)
GLUCOSE-CAPILLARY: 229 mg/dL — AB (ref 70–99)
Glucose-Capillary: 424 mg/dL — ABNORMAL HIGH (ref 70–99)
Glucose-Capillary: 376 mg/dL — ABNORMAL HIGH (ref 70–99)
Glucose-Capillary: 243 mg/dL — ABNORMAL HIGH (ref 70–99)
Glucose-Capillary: 248 mg/dL — ABNORMAL HIGH (ref 70–99)
Glucose-Capillary: 240 mg/dL — ABNORMAL HIGH (ref 70–99)
Glucose-Capillary: 282 mg/dL — ABNORMAL HIGH (ref 70–99)

## 2015-04-30 LAB — BASIC METABOLIC PANEL
Anion gap: 12 (ref 5–15)
BUN: 19 mg/dL (ref 6–20)
CO2: 23 mmol/L (ref 22–32)
Calcium: 8.3 mg/dL — ABNORMAL LOW (ref 8.9–10.3)
Chloride: 88 mmol/L — ABNORMAL LOW (ref 101–111)
Creatinine, Ser: 0.89 mg/dL (ref 0.44–1.00)
GFR calc Af Amer: >60 mL/min (ref >60)
GFR calc non Af Amer: >60 mL/min (ref >60)
GLUCOSE: 414 mg/dL — AB (ref 70–99)
POTASSIUM: 4 mmol/L (ref 3.5–5.1)
SODIUM: 123 mmol/L — AB (ref 135–145)

## 2015-04-30 LAB — CBC
HCT: 31.3 % — ABNORMAL LOW (ref 36.0–46.0)
Hemoglobin: 10.2 g/dL — ABNORMAL LOW (ref 12.0–15.0)
MCH: 24.3 pg — ABNORMAL LOW (ref 26.0–34.0)
MCHC: 32.6 g/dL (ref 30.0–36.0)
MCV: 74.7 fL — AB (ref 78.0–100.0)
PLATELETS: 343 10*3/uL (ref 150–400)
RBC: 4.19 MIL/uL (ref 3.87–5.11)
RDW: 14.2 % (ref 11.5–15.5)
WBC: 17.1 10*3/uL — AB (ref 4.0–10.5)

## 2015-04-30 LAB — I-STAT CG4 LACTIC ACID, ED: Lactic Acid, Venous: 1.57 mmol/L (ref 0.5–2.0)

## 2015-04-30 LAB — SEDIMENTATION RATE: SED RATE: 125 mm/h — AB (ref 0–22)

## 2015-04-30 LAB — SURGICAL PCR SCREEN
MRSA, PCR: NEGATIVE
Staphylococcus aureus: POSITIVE — AB

## 2015-04-30 SURGERY — IRRIGATION AND DEBRIDEMENT SHOULDER
Anesthesia: General | Laterality: Right

## 2015-04-30 MED ORDER — ACETAMINOPHEN 160 MG/5ML PO SOLN
325.0000 mg | ORAL | Status: DC | PRN
  Filled 2015-04-30: qty 20.3

## 2015-04-30 MED ORDER — ACETAMINOPHEN 325 MG PO TABS
650.0000 mg | ORAL_TABLET | ORAL | Status: DC | PRN
  Administered 2015-05-02: 650 mg via ORAL
  Filled 2015-04-30: qty 2

## 2015-04-30 MED ORDER — DEXTROSE 5 % IV SOLN
3.0000 g | Freq: Once | INTRAVENOUS | Status: AC
  Administered 2015-04-30: 3 g via INTRAVENOUS
  Filled 2015-04-30: qty 3000

## 2015-04-30 MED ORDER — CEFTRIAXONE SODIUM IN DEXTROSE 40 MG/ML IV SOLN: 2.0000 g | INTRAVENOUS | Status: DC

## 2015-04-30 MED ORDER — INSULIN ASPART 100 UNIT/ML ~~LOC~~ SOLN
0.0000 [IU] | SUBCUTANEOUS | Status: DC
  Administered 2015-04-30: 11 [IU] via SUBCUTANEOUS
  Administered 2015-04-30: 20 [IU] via SUBCUTANEOUS
  Administered 2015-04-30: 7 [IU] via SUBCUTANEOUS
  Administered 2015-04-30: 20 [IU] via SUBCUTANEOUS
  Administered 2015-05-01 (×2): 15 [IU] via SUBCUTANEOUS
  Administered 2015-05-01: 11 [IU] via SUBCUTANEOUS

## 2015-04-30 MED ORDER — DEXTROSE 5 % IV SOLN
2.0000 g | Freq: Once | INTRAVENOUS | Status: AC
  Administered 2015-04-30: 2 g via INTRAVENOUS
  Filled 2015-04-30: qty 2

## 2015-04-30 MED ORDER — PROPOFOL 10 MG/ML IV BOLUS
INTRAVENOUS | Status: AC
  Filled 2015-04-30: qty 20

## 2015-04-30 MED ORDER — OXYCODONE HCL 5 MG PO TABS
ORAL_TABLET | ORAL | Status: AC
  Filled 2015-04-30: qty 1

## 2015-04-30 MED ORDER — HYDROMORPHONE HCL 1 MG/ML IJ SOLN
INTRAMUSCULAR | Status: AC
  Filled 2015-04-30: qty 1

## 2015-04-30 MED ORDER — DOCUSATE SODIUM 100 MG PO CAPS
100.0000 mg | ORAL_CAPSULE | Freq: Two times a day (BID) | ORAL | Status: DC
  Administered 2015-04-30 – 2015-05-14 (×29): 100 mg via ORAL
  Filled 2015-04-30 (×29): qty 1

## 2015-04-30 MED ORDER — ROCURONIUM BROMIDE 50 MG/5ML IV SOLN
INTRAVENOUS | Status: AC
  Filled 2015-04-30: qty 1

## 2015-04-30 MED ORDER — ONDANSETRON HCL 4 MG/2ML IJ SOLN
INTRAMUSCULAR | Status: AC
  Filled 2015-04-30: qty 2

## 2015-04-30 MED ORDER — HYDROMORPHONE HCL 1 MG/ML IJ SOLN
0.2500 mg | INTRAMUSCULAR | Status: DC | PRN
  Administered 2015-04-30 (×4): 0.5 mg via INTRAVENOUS

## 2015-04-30 MED ORDER — DEXTROSE-NACL 5-0.9 % IV SOLN
INTRAVENOUS | Status: DC
  Administered 2015-04-30: 05:00:00 via INTRAVENOUS

## 2015-04-30 MED ORDER — VANCOMYCIN HCL 10 G IV SOLR
1500.0000 mg | Freq: Three times a day (TID) | INTRAVENOUS | Status: DC
  Administered 2015-04-30 – 2015-05-01 (×2): 1500 mg via INTRAVENOUS
  Filled 2015-04-30 (×5): qty 1500

## 2015-04-30 MED ORDER — HEPARIN SODIUM (PORCINE) 5000 UNIT/ML IJ SOLN
5000.0000 [IU] | Freq: Three times a day (TID) | INTRAMUSCULAR | Status: DC
  Administered 2015-04-30 – 2015-05-02 (×6): 5000 [IU] via SUBCUTANEOUS
  Filled 2015-04-30 (×6): qty 1

## 2015-04-30 MED ORDER — FENTANYL CITRATE (PF) 250 MCG/5ML IJ SOLN
INTRAMUSCULAR | Status: AC
  Filled 2015-04-30: qty 5

## 2015-04-30 MED ORDER — METOCLOPRAMIDE HCL 5 MG PO TABS
5.0000 mg | ORAL_TABLET | Freq: Three times a day (TID) | ORAL | Status: DC | PRN
  Administered 2015-05-06: 5 mg via ORAL
  Filled 2015-04-30: qty 1

## 2015-04-30 MED ORDER — GLYCOPYRROLATE 0.2 MG/ML IJ SOLN
INTRAMUSCULAR | Status: AC
  Filled 2015-04-30: qty 4

## 2015-04-30 MED ORDER — LISINOPRIL 5 MG PO TABS
5.0000 mg | ORAL_TABLET | Freq: Every day | ORAL | Status: DC
  Administered 2015-04-30 – 2015-05-01 (×2): 5 mg via ORAL
  Filled 2015-04-30 (×2): qty 1

## 2015-04-30 MED ORDER — ONDANSETRON HCL 4 MG/2ML IJ SOLN
INTRAMUSCULAR | Status: DC | PRN
  Administered 2015-04-30: 4 mg via INTRAVENOUS

## 2015-04-30 MED ORDER — MIDAZOLAM HCL 2 MG/2ML IJ SOLN
INTRAMUSCULAR | Status: AC
  Filled 2015-04-30: qty 2

## 2015-04-30 MED ORDER — OXYCODONE HCL 5 MG/5ML PO SOLN: 5.0000 mg | Freq: Once | ORAL | Status: AC | PRN

## 2015-04-30 MED ORDER — OXYCODONE HCL 5 MG PO TABS
5.0000 mg | ORAL_TABLET | Freq: Once | ORAL | Status: AC | PRN
  Administered 2015-04-30: 5 mg via ORAL

## 2015-04-30 MED ORDER — VANCOMYCIN HCL IN DEXTROSE 1-5 GM/200ML-% IV SOLN
1000.0000 mg | Freq: Once | INTRAVENOUS | Status: AC
  Administered 2015-04-30: 1000 mg via INTRAVENOUS
  Filled 2015-04-30: qty 200

## 2015-04-30 MED ORDER — METOCLOPRAMIDE HCL 5 MG/ML IJ SOLN: 5.0000 mg | Freq: Three times a day (TID) | INTRAMUSCULAR | Status: DC | PRN

## 2015-04-30 MED ORDER — VITAMIN D (ERGOCALCIFEROL) 1.25 MG (50000 UNIT) PO CAPS
50000.0000 [IU] | ORAL_CAPSULE | ORAL | Status: DC
  Administered 2015-05-06 – 2015-05-13 (×2): 50000 [IU] via ORAL
  Filled 2015-04-30 (×2): qty 1

## 2015-04-30 MED ORDER — NEOSTIGMINE METHYLSULFATE 10 MG/10ML IV SOLN
INTRAVENOUS | Status: DC | PRN
Start: 2015-04-30 — End: 2015-04-30
  Administered 2015-04-30: 5 mg via INTRAVENOUS

## 2015-04-30 MED ORDER — ONDANSETRON HCL 4 MG/2ML IJ SOLN: 4.0000 mg | Freq: Four times a day (QID) | INTRAMUSCULAR | Status: DC | PRN

## 2015-04-30 MED ORDER — VANCOMYCIN HCL 10 G IV SOLR
1500.0000 mg | Freq: Once | INTRAVENOUS | Status: AC
  Administered 2015-04-30: 1500 mg via INTRAVENOUS
  Filled 2015-04-30: qty 1500

## 2015-04-30 MED ORDER — LACTATED RINGERS IV SOLN
INTRAVENOUS | Status: DC
  Administered 2015-04-30: 17:00:00 via INTRAVENOUS

## 2015-04-30 MED ORDER — MORPHINE SULFATE 4 MG/ML IJ SOLN
4.0000 mg | Freq: Once | INTRAMUSCULAR | Status: AC
  Administered 2015-04-30: 4 mg via INTRAVENOUS
  Filled 2015-04-30: qty 1

## 2015-04-30 MED ORDER — OXYCODONE-ACETAMINOPHEN 5-325 MG PO TABS
1.0000 | ORAL_TABLET | ORAL | Status: DC | PRN
  Administered 2015-04-30 – 2015-05-05 (×16): 2 via ORAL
  Administered 2015-05-06: 1 via ORAL
  Administered 2015-05-07 (×2): 2 via ORAL
  Administered 2015-05-07: 1 via ORAL
  Administered 2015-05-07 – 2015-05-13 (×20): 2 via ORAL
  Filled 2015-04-30 (×7): qty 2
  Filled 2015-04-30: qty 1
  Filled 2015-04-30 (×6): qty 2
  Filled 2015-04-30: qty 1
  Filled 2015-04-30 (×3): qty 2
  Filled 2015-04-30: qty 1
  Filled 2015-04-30 (×22): qty 2

## 2015-04-30 MED ORDER — ONDANSETRON HCL 4 MG PO TABS: 4.0000 mg | ORAL_TABLET | Freq: Four times a day (QID) | ORAL | Status: DC | PRN

## 2015-04-30 MED ORDER — PIPERACILLIN-TAZOBACTAM 3.375 G IVPB
3.3750 g | Freq: Three times a day (TID) | INTRAVENOUS | Status: DC
  Administered 2015-04-30 – 2015-05-03 (×11): 3.375 g via INTRAVENOUS
  Filled 2015-04-30 (×14): qty 50

## 2015-04-30 MED ORDER — INSULIN GLARGINE 100 UNIT/ML ~~LOC~~ SOLN
60.0000 [IU] | Freq: Every day | SUBCUTANEOUS | Status: DC
  Administered 2015-04-30 – 2015-05-02 (×3): 60 [IU] via SUBCUTANEOUS
  Filled 2015-04-30 (×4): qty 0.6

## 2015-04-30 MED ORDER — GABAPENTIN 300 MG PO CAPS
300.0000 mg | ORAL_CAPSULE | Freq: Three times a day (TID) | ORAL | Status: DC
  Administered 2015-04-30 – 2015-05-03 (×11): 300 mg via ORAL
  Filled 2015-04-30 (×11): qty 1

## 2015-04-30 MED ORDER — MIDAZOLAM HCL 5 MG/5ML IJ SOLN
INTRAMUSCULAR | Status: DC | PRN
  Administered 2015-04-30: 2 mg via INTRAVENOUS

## 2015-04-30 MED ORDER — LIDOCAINE HCL (CARDIAC) 20 MG/ML IV SOLN
INTRAVENOUS | Status: AC
  Filled 2015-04-30: qty 5

## 2015-04-30 MED ORDER — HYDROMORPHONE HCL 1 MG/ML IJ SOLN
1.0000 mg | INTRAMUSCULAR | Status: DC | PRN
  Administered 2015-04-30 – 2015-05-14 (×41): 1 mg via INTRAVENOUS
  Filled 2015-04-30 (×41): qty 1

## 2015-04-30 MED ORDER — ACETAMINOPHEN 500 MG PO TABS: 1000.0000 mg | ORAL_TABLET | Freq: Once | ORAL | Status: DC

## 2015-04-30 MED ORDER — LACTATED RINGERS IV SOLN
INTRAVENOUS | Status: DC | PRN
  Administered 2015-04-30: 18:00:00 via INTRAVENOUS

## 2015-04-30 MED ORDER — SODIUM CHLORIDE 0.9 % IV SOLN
INTRAVENOUS | Status: DC
  Administered 2015-04-30 – 2015-05-02 (×2): via INTRAVENOUS

## 2015-04-30 MED ORDER — ROCURONIUM BROMIDE 100 MG/10ML IV SOLN
INTRAVENOUS | Status: DC | PRN
  Administered 2015-04-30: 50 mg via INTRAVENOUS

## 2015-04-30 MED ORDER — PROPOFOL 10 MG/ML IV BOLUS
INTRAVENOUS | Status: DC | PRN
  Administered 2015-04-30: 150 mg via INTRAVENOUS

## 2015-04-30 MED ORDER — SODIUM CHLORIDE 0.9 % IR SOLN
Status: DC | PRN
  Administered 2015-04-30: 3000 mL
  Administered 2015-04-30: 1000 mL
  Administered 2015-04-30: 3000 mL

## 2015-04-30 MED ORDER — ACETAMINOPHEN 325 MG PO TABS: 325.0000 mg | ORAL_TABLET | ORAL | Status: DC | PRN

## 2015-04-30 MED ORDER — LIDOCAINE HCL (CARDIAC) 20 MG/ML IV SOLN
INTRAVENOUS | Status: DC | PRN
  Administered 2015-04-30: 20 mg via INTRAVENOUS

## 2015-04-30 MED ORDER — ASPIRIN 81 MG PO CHEW
81.0000 mg | CHEWABLE_TABLET | Freq: Every day | ORAL | Status: DC
  Administered 2015-04-30 – 2015-05-02 (×3): 81 mg via ORAL
  Filled 2015-04-30 (×4): qty 1

## 2015-04-30 MED ORDER — GLYCOPYRROLATE 0.2 MG/ML IJ SOLN
INTRAMUSCULAR | Status: DC | PRN
  Administered 2015-04-30: .8 mg via INTRAVENOUS

## 2015-04-30 MED ORDER — DEXTROSE-NACL 5-0.45 % IV SOLN: 100.0000 mL/h | INTRAVENOUS | Status: DC

## 2015-04-30 MED ORDER — NEOSTIGMINE METHYLSULFATE 10 MG/10ML IV SOLN
INTRAVENOUS | Status: AC
  Filled 2015-04-30: qty 1

## 2015-04-30 MED ORDER — FENTANYL CITRATE (PF) 100 MCG/2ML IJ SOLN
INTRAMUSCULAR | Status: DC | PRN
  Administered 2015-04-30 (×3): 100 ug via INTRAVENOUS
  Administered 2015-04-30: 50 ug via INTRAVENOUS

## 2015-04-30 SURGICAL SUPPLY — 54 items
BLADE GREAT WHITE 4.2 (BLADE) ×2 IMPLANT
BLADE GREAT WHITE 4.2MM (BLADE) ×1
COVER SURGICAL LIGHT HANDLE (MISCELLANEOUS) ×3 IMPLANT
DRAPE STERI 35X30 U-POUCH (DRAPES) ×3 IMPLANT
DRAPE U-SHAPE 47X51 STRL (DRAPES) ×6 IMPLANT
DRSG PAD ABDOMINAL 8X10 ST (GAUZE/BANDAGES/DRESSINGS) ×6 IMPLANT
DURAPREP 26ML APPLICATOR (WOUND CARE) ×3 IMPLANT
ELECT REM PT RETURN 9FT ADLT (ELECTROSURGICAL)
ELECTRODE REM PT RTRN 9FT ADLT (ELECTROSURGICAL) IMPLANT
EVACUATOR 1/8 PVC DRAIN (DRAIN) ×3 IMPLANT
GAUZE IODOFORM PACK 1/2 7832 (GAUZE/BANDAGES/DRESSINGS) ×6 IMPLANT
GAUZE SPONGE 4X4 12PLY STRL (GAUZE/BANDAGES/DRESSINGS) ×6 IMPLANT
GLOVE BIOGEL PI ORTHO PRO 7.5 (GLOVE) ×2
GLOVE BIOGEL PI ORTHO PRO SZ8 (GLOVE) ×2
GLOVE ORTHO TXT STRL SZ7.5 (GLOVE) ×3 IMPLANT
GLOVE PI ORTHO PRO STRL 7.5 (GLOVE) ×1 IMPLANT
GLOVE PI ORTHO PRO STRL SZ8 (GLOVE) ×1 IMPLANT
GLOVE SURG ORTHO 8.5 STRL (GLOVE) ×3 IMPLANT
GOWN STRL REUS W/ TWL LRG LVL3 (GOWN DISPOSABLE) ×1 IMPLANT
GOWN STRL REUS W/ TWL XL LVL3 (GOWN DISPOSABLE) ×4 IMPLANT
GOWN STRL REUS W/TWL LRG LVL3 (GOWN DISPOSABLE) ×2
GOWN STRL REUS W/TWL XL LVL3 (GOWN DISPOSABLE) ×8
HANDPIECE INTERPULSE COAX TIP (DISPOSABLE)
KIT BASIN OR (CUSTOM PROCEDURE TRAY) ×3 IMPLANT
KIT ROOM TURNOVER OR (KITS) ×3 IMPLANT
MANIFOLD NEPTUNE II (INSTRUMENTS) ×3 IMPLANT
NEEDLE SPNL 18GX3.5 QUINCKE PK (NEEDLE) ×3 IMPLANT
NS IRRIG 1000ML POUR BTL (IV SOLUTION) ×3 IMPLANT
PACK SHOULDER (CUSTOM PROCEDURE TRAY) ×3 IMPLANT
PAD ABD 8X10 STRL (GAUZE/BANDAGES/DRESSINGS) ×3 IMPLANT
PAD ARMBOARD 7.5X6 YLW CONV (MISCELLANEOUS) ×6 IMPLANT
SET ARTHROSCOPY TUBING (MISCELLANEOUS) ×2
SET ARTHROSCOPY TUBING LN (MISCELLANEOUS) ×1 IMPLANT
SET HNDPC FAN SPRY TIP SCT (DISPOSABLE) IMPLANT
SLING ARM FOAM STRAP XLG (SOFTGOODS) ×3 IMPLANT
SPONGE GAUZE 4X4 12PLY STER LF (GAUZE/BANDAGES/DRESSINGS) ×3 IMPLANT
SPONGE LAP 18X18 X RAY DECT (DISPOSABLE) ×3 IMPLANT
SUT FIBERWIRE #2 38 T-5 BLUE (SUTURE)
SUT MNCRL AB 3-0 PS2 18 (SUTURE) ×3 IMPLANT
SUT VIC AB 0 CT1 27 (SUTURE) ×2
SUT VIC AB 0 CT1 27XBRD ANBCTR (SUTURE) ×1 IMPLANT
SUT VIC AB 2-0 CT1 27 (SUTURE) ×2
SUT VIC AB 2-0 CT1 TAPERPNT 27 (SUTURE) ×1 IMPLANT
SUTURE FIBERWR #2 38 T-5 BLUE (SUTURE) IMPLANT
SWAB COLLECTION DEVICE MRSA (MISCELLANEOUS) ×3 IMPLANT
TAPE CLOTH SURG 4X10 WHT LF (GAUZE/BANDAGES/DRESSINGS) ×3 IMPLANT
TOWEL OR 17X24 6PK STRL BLUE (TOWEL DISPOSABLE) ×3 IMPLANT
TOWEL OR 17X26 10 PK STRL BLUE (TOWEL DISPOSABLE) ×3 IMPLANT
TUBE ANAEROBIC SPECIMEN COL (MISCELLANEOUS) IMPLANT
TUBE CONNECTING 12'X1/4 (SUCTIONS) ×1
TUBE CONNECTING 12X1/4 (SUCTIONS) ×2 IMPLANT
UNDERPAD 30X30 INCONTINENT (UNDERPADS AND DIAPERS) ×3 IMPLANT
WATER STERILE IRR 1000ML POUR (IV SOLUTION) ×3 IMPLANT
YANKAUER SUCT BULB TIP NO VENT (SUCTIONS) ×3 IMPLANT

## 2015-04-30 NOTE — Consult Note (Signed)
Reason for Consult: Chest wall erythema, right shoulder joint purulence Referring Physician: Dr. Brien [name] is an 34 y.o. female.  HPI: Intraoperative consult. History of present illness gathered by chart. Patient is a 34 y.o. female with hx of obesity, asthma, DM, HTN.  Patient was recently seen in the ER secondary to a right shoulder abscesses as well as I indeed in the ER. Patient returns secondary to pain. Patient underwent an MRI which revealed a pocket of purulence which appeared to be communicating with the right shoulder joint per MRI. Patient also had some induration to the right chest wall.  We were consulted intraoperatively by Dr. Percell [name] to evaluate for any possible pectoralis involvement as MRI revealed myositis.  Past Medical History  Diagnosis Date  . Hypertension   . Diabetes mellitus   . Asthma   . Obesity     Past Surgical History  Procedure Laterality Date  . Cesarean section    . Tonsillectomy    . Leg surgery      Family History  Problem Relation Age of Onset  . Diabetes Mother   . Hypertension Mother   . Heart disease Mother   . Diabetes Father   . Heart disease Father   . Stroke Maternal Grandmother   . Cancer Maternal Grandmother     Social History:  reports that she has never smoked. She does not have any smokeless tobacco history on file. She reports that she drinks alcohol. She reports that she does not use illicit drugs.  Allergies: No Known Allergies  Medications: I have reviewed the patient's current medications.  Results for orders placed or performed during the hospital encounter of 04/30/15 (from the past 48 hour(s))  CBC with Differential     Status: Abnormal   Collection Time: 04/30/15  1:23 AM  Result Value Ref Range   WBC 18.2 (H) 4.0 - 10.5 K/uL   RBC 4.33 3.87 - 5.11 MIL/uL   Hemoglobin 10.7 (L) 12.0 - 15.0 g/dL   HCT 32.5 (L) 36.0 - 46.0 %   MCV 75.1 (L) 78.0 - 100.0 fL   MCH 24.7 (L) 26.0 - 34.0 pg   MCHC 32.9  30.0 - 36.0 g/dL   RDW 14.1 11.5 - 15.5 %   Platelets 353 150 - 400 K/uL   nRBC 0 0 /100 WBC   Neutrophils Relative % 0 (L) 43 - 77 %   Lymphocytes Relative 0 (L) 12 - 46 %   Monocytes Relative 0 (L) 3 - 12 %   Eosinophils Relative 0 0 - 5 %   Basophils Relative 0 0 - 1 %   Band Neutrophils 0 0 - 10 %   Metamyelocytes Relative 0 %   Myelocytes 0 %   Promyelocytes Absolute 0 %   Blasts 0 %   Neutro Abs 0.0 (L) 1.7 - 7.7 K/uL   Lymphs Abs 0.0 (L) 0.7 - 4.0 K/uL   Monocytes Absolute 0.0 (L) 0.1 - 1.0 K/uL   Eosinophils Absolute 0.0 0.0 - 0.7 K/uL   Basophils Absolute 0.0 0.0 - 0.1 K/uL   WBC Morphology MILD LEFT SHIFT (1-5% METAS, OCC MYELO, OCC BANDS)     Comment: TOXIC GRANULATION ATYPICAL LYMPHOCYTES   Comprehensive metabolic panel     Status: Abnormal   Collection Time: 04/30/15  1:23 AM  Result Value Ref Range   Sodium 121 (L) 135 - 145 mmol/L   Potassium 4.2 3.5 - 5.1 mmol/L   Chloride 86 (L) 101 -  111 mmol/L   CO2 23 22 - 32 mmol/L   Glucose, Bld 438 (H) 70 - 99 mg/dL   BUN 18 6 - 20 mg/dL   Creatinine, Ser 1.05 (H) 0.44 - 1.00 mg/dL   Calcium 8.3 (L) 8.9 - 10.3 mg/dL   Total Protein 7.4 6.5 - 8.1 g/dL   Albumin 1.9 (L) 3.5 - 5.0 g/dL   AST 19 15 - 41 U/L   ALT 14 14 - 54 U/L   Alkaline Phosphatase 170 (H) 38 - 126 U/L   Total Bilirubin 0.7 0.3 - 1.2 mg/dL   GFR calc non Af Amer >60 >60 mL/min   GFR calc Af Amer >60 >60 mL/min    Comment: (NOTE) The eGFR has been calculated using the CKD EPI equation. This calculation has not been validated in all clinical situations. eGFR's persistently <60 mL/min signify possible Chronic Kidney Disease.    Anion gap 12 5 - 15  Sedimentation rate     Status: Abnormal   Collection Time: 04/30/15  1:23 AM  Result Value Ref Range   Sed Rate 125 (H) 0 - 22 mm/hr  I-Stat CG4 Lactic Acid, ED     Status: None   Collection Time: 04/30/15  1:31 AM  Result Value Ref Range   Lactic Acid, Venous 1.57 0.5 - 2.0 mmol/L  Urinalysis,  Routine w reflex microscopic     Status: Abnormal   Collection Time: 04/30/15  2:55 AM  Result Value Ref Range   Color, Urine AMBER (A) YELLOW    Comment: BIOCHEMICALS MAY BE AFFECTED BY COLOR   APPearance CLOUDY (A) CLEAR   Specific Gravity, Urine 1.027 1.005 - 1.030   pH 5.0 5.0 - 8.0   Glucose, UA >1000 (A) NEGATIVE mg/dL   Hgb urine dipstick MODERATE (A) NEGATIVE   Bilirubin Urine SMALL (A) NEGATIVE   Ketones, ur 15 (A) NEGATIVE mg/dL   Protein, ur 100 (A) NEGATIVE mg/dL   Urobilinogen, UA 1.0 0.0 - 1.0 mg/dL   Nitrite NEGATIVE NEGATIVE   Leukocytes, UA TRACE (A) NEGATIVE  Urine microscopic-add on     Status: Abnormal   Collection Time: 04/30/15  2:55 AM  Result Value Ref Range   Squamous Epithelial / LPF RARE RARE   WBC, UA 7-10 <3 WBC/hpf   RBC / HPF 7-10 <3 RBC/hpf   Bacteria, UA FEW (A) RARE   Casts HYALINE CASTS (A) NEGATIVE   Urine-Other RARE YEAST   Glucose, capillary     Status: Abnormal   Collection Time: 04/30/15  4:34 AM  Result Value Ref Range   Glucose-Capillary 424 (H) 70 - 99 mg/dL  CBC     Status: Abnormal   Collection Time: 04/30/15  6:27 AM  Result Value Ref Range   WBC 17.1 (H) 4.0 - 10.5 K/uL   RBC 4.19 3.87 - 5.11 MIL/uL   Hemoglobin 10.2 (L) 12.0 - 15.0 g/dL   HCT 31.3 (L) 36.0 - 46.0 %   MCV 74.7 (L) 78.0 - 100.0 fL   MCH 24.3 (L) 26.0 - 34.0 pg   MCHC 32.6 30.0 - 36.0 g/dL   RDW 14.2 11.5 - 15.5 %   Platelets 343 150 - 400 K/uL  Basic metabolic panel     Status: Abnormal   Collection Time: 04/30/15  6:27 AM  Result Value Ref Range   Sodium 123 (L) 135 - 145 mmol/L   Potassium 4.0 3.5 - 5.1 mmol/L   Chloride 88 (L) 101 - 111 mmol/L  CO2 23 22 - 32 mmol/L   Glucose, Bld 414 (H) 70 - 99 mg/dL   BUN 19 6 - 20 mg/dL   Creatinine, Ser 0.89 0.44 - 1.00 mg/dL   Calcium 8.3 (L) 8.9 - 10.3 mg/dL   GFR calc non Af Amer >60 >60 mL/min   GFR calc Af Amer >60 >60 mL/min    Comment: (NOTE) The eGFR has been calculated using the CKD EPI  equation. This calculation has not been validated in all clinical situations. eGFR's persistently <60 mL/min signify possible Chronic Kidney Disease.    Anion gap 12 5 - 15  Glucose, capillary     Status: Abnormal   Collection Time: 04/30/15  7:55 AM  Result Value Ref Range   Glucose-Capillary 376 (H) 70 - 99 mg/dL  Glucose, capillary     Status: Abnormal   Collection Time: 04/30/15 12:04 PM  Result Value Ref Range   Glucose-Capillary 290 (H) 70 - 99 mg/dL  Surgical pcr screen     Status: Abnormal   Collection Time: 04/30/15  3:23 PM  Result Value Ref Range   MRSA, PCR NEGATIVE NEGATIVE   Staphylococcus aureus POSITIVE (A) NEGATIVE    Comment:        The Xpert SA Assay (FDA approved for NASAL specimens in patients over 47 years of age), is one component of a comprehensive surveillance program.  Test performance has been validated by [facility] for patients greater than or equal to 65 year old. It is not intended to diagnose infection nor to guide or monitor treatment.   Glucose, capillary     Status: Abnormal   Collection Time: 04/30/15  3:54 PM  Result Value Ref Range   Glucose-Capillary 248 (H) 70 - 99 mg/dL  Glucose, capillary     Status: Abnormal   Collection Time: 04/30/15  4:29 PM  Result Value Ref Range   Glucose-Capillary 243 (H) 70 - 99 mg/dL  Glucose, capillary     Status: Abnormal   Collection Time: 04/30/15  6:11 PM  Result Value Ref Range   Glucose-Capillary 240 (H) 70 - 99 mg/dL    Mr Shoulder Right Wo Contrast  04/30/2015   CLINICAL DATA:  Right shoulder pain. Recent history of antibiotic use. Patient was in too much pain to continue with the contrasted portion of the exam.  EXAM: MRI OF THE RIGHT SHOULDER WITHOUT CONTRAST  TECHNIQUE: Multiplanar, multisequence MR imaging of the shoulder was performed. No intravenous contrast was administered.  COMPARISON:  None.  FINDINGS: Rotator cuff: Mild tendinosis of the supraspinatus and infraspinatus tendon.  Teres minor tendon is intact. Subscapularis tendon is intact.  Muscles: There is severe muscle edema in the subscapularis muscle with small 5 x 6 x 25 mm fluid collection. There is severe muscle edema in the anterior and posterior deltoid muscles with a 14 mm fluid collection in the deltoid muscle. There is soft tissue edema superficial to the subscapularis and infraspinatus muscles. There is mild muscle edema along the periphery of the teres minor muscle.  Biceps long head:  Intact.  Acromioclavicular Joint: Mild degenerative change of the acromioclavicular joint. Type II acromion.  Glenohumeral Joint: Large joint effusion.  No chondral defect.  Labrum:  Intact.  Bones: No focal marrow signal abnormality. No fracture or dislocation.  IMPRESSION: 1. Severe muscle edema in the subscapularis muscle with small 5 x 6 x 25 mm fluid collection. There is severe muscle edema in the anterior and posterior deltoid muscles with a 14 mm  fluid collection in the deltoid muscle. Soft tissue edema superficial to the subscapularis and infraspinatus muscles. Mild muscle edema along the periphery of the teres minor muscle. The overall appearance is most concerning for myositis which may be secondary to an infectious or inflammatory etiology. The small fluid collections make the process more concerning for an infectious etiology. 2. Large glenohumeral joint effusion. Septic arthritis cannot be excluded.   Electronically Signed   By: Kathreen Devoid   On: 04/30/2015 10:45    Review of Systems  Unable to perform ROS: other   Blood pressure 127/65, pulse 105, temperature 98 F (36.7 C), temperature source Oral, resp. rate 20, height 5' 8"  (1.727 m), weight 159.666 kg (352 lb), last menstrual period 04/23/2015, SpO2 96 %. Physical Exam  Vitals reviewed. Constitutional: She is oriented to person, place, and time. She appears well-developed and well-nourished. She is cooperative. No distress. Cervical collar and nasal cannula in  place.  HENT:  Head: Normocephalic and atraumatic. Head is without raccoon's eyes, without Battle's sign, without abrasion, without contusion and without laceration.  Right Ear: Hearing, tympanic membrane, external ear and ear canal normal. No lacerations. No drainage or tenderness. No foreign bodies. Tympanic membrane is not perforated. No hemotympanum.  Left Ear: Hearing, tympanic membrane, external ear and ear canal normal. No lacerations. No drainage or tenderness. No foreign bodies. Tympanic membrane is not perforated. No hemotympanum.  Nose: Nose normal. No nose lacerations, sinus tenderness, nasal deformity or nasal septal hematoma. No epistaxis.  Mouth/Throat: Uvula is midline, oropharynx is clear and moist and mucous membranes are normal. No lacerations.  Eyes: Lids are normal. No scleral icterus.  Neck: Trachea normal. No JVD present. No spinous process tenderness and no muscular tenderness present. Carotid bruit is not present. No thyromegaly present.  Cardiovascular: Normal rate, regular rhythm, normal heart sounds, intact distal pulses and normal pulses.   Respiratory: Effort normal and breath sounds normal. No respiratory distress. She exhibits no tenderness, no bony tenderness, no laceration and no crepitus.  GI: Soft. Normal appearance. She exhibits no distension. Bowel sounds are decreased. There is no tenderness. There is no rigidity, no rebound, no guarding and no CVA tenderness.  Musculoskeletal: Normal range of motion. She exhibits no edema or tenderness.       Arms: Lymphadenopathy:    She has no cervical adenopathy.  Neurological: She is alert and oriented to person, place, and time. She has normal strength. No cranial nerve deficit or sensory deficit. GCS eye subscore is 4. GCS verbal subscore is 5. GCS motor subscore is 6.  Skin: Skin is warm, dry and intact. She is not diaphoretic.  Psychiatric: She has a normal mood and affect. Her speech is normal and behavior is normal.     Assessment/Plan: 34 year old female with right shoulder infection and cellulitis to the right chest wall.  We'll evaluate the right chest wall and musculature intraoperatively.  Lindsay [name]., Lorrane Mccay 04/30/2015, 7:36 PM

## 2015-04-30 NOTE — Transfer of Care (Signed)
Immediate Anesthesia Transfer of Care Note  Patient: Lindsay [name]  Procedure(s) Performed: Procedure(s): IRRIGATION AND DEBRIDEMENT SHOULDER (Right) ARTHROSCOPY SHOULDER (Right)  Patient Location: PACU  Anesthesia Type:General  Level of Consciousness: awake, alert  and oriented  Airway & Oxygen Therapy: Patient connected to face mask oxygen  Post-op Assessment: Report given to RN, Post -op Vital signs reviewed and stable and Patient moving all extremities X 4  Post vital signs: Reviewed and stable  Last Vitals:  Filed Vitals:   04/30/15 1107  BP: 127/65  Pulse: 105  Temp:   Resp: 20    Complications: No apparent anesthesia complications

## 2015-04-30 NOTE — Op Note (Signed)
04/30/2015  7:41 PM  PATIENT:  Lindsay [name]  34 y.o. female  PRE-OPERATIVE DIAGNOSIS:  right shoulder abscess  POST-OPERATIVE DIAGNOSIS:  Right chest wall subcutaneous abscess for my portion  PROCEDURE:  Procedure(s): I&D of right chest wall abscess  SURGEON:  Surgeon(s) and Role:    * Axel Filler, MD - Primary    * Sheral Apley, MD - Assisting  ANESTHESIA:   general  EBL:   5cc  BLOOD ADMINISTERED:none  DRAINS: Half-inch iodoform gauze to the right pectoral incision site   LOCAL MEDICATIONS USED:  NONE  SPECIMEN:  No Specimen  DISPOSITION OF SPECIMEN:  N/A  COUNTS:  YES for my portion, final count to be reported on Dr. Greig Right operative note   TOURNIQUET:  * No tourniquets in log *  DICTATION: .Dragon Dictation  Intra operative consult was obtained by Dr. Eulah Pont secondary to the patient's history of right chest wall purulence and erythema. Dr. Eulah Pont had confirmed a pocket of pus that was connecting with the shoulder joint. His operative note can be referred for full details.  I proceeded to extend the incision that was briefly paced by Dr. Eulah Pont. Blunt dissection was taken down to the subcutaneous tissue. This tract along the lateral chest wall and the subcutaneous area, approximately 5-7 cm. There was a pocket of purulence that had its loculations broken up.  The fascia medial to this area was intact and there was appeared to be no spreading of the infection to the fascia or the surrounding musculature.  At this time the case was turned back over to Dr. Eulah Pont to complete his portion of the case. I recommended to pack the wound with half-inch iodoform gauze.   PLAN OF CARE: Admit to inpatient   PATIENT DISPOSITION:  PACU - hemodynamically stable.   Delay start of Pharmacological VTE agent (>24hrs) due to surgical blood loss or risk of bleeding: not applicable

## 2015-04-30 NOTE — Progress Notes (Signed)
2nd RN Skin assessment - Lindsay [name].

## 2015-04-30 NOTE — Progress Notes (Signed)
Patient was seen and examined, admitted by Dr. Houston Siren this morning  Briefly, 34 year old female with uncontrolled diabetes, hypertension presented with right shoulder pain, worsening, indurated area with marked leukocytosis and hyperglycemia with blood sugars in 400s. Patient had I&D done in the ED on 5/7 and patient was sent home from the ED with doxycycline. Cultures have been so far negative. BP 127/65 mmHg  Pulse 105  Temp(Src) 98 F (36.7 C) (Oral)  Resp 20  Ht 5\' 8"  (1.727 m)  Wt 159.666 kg (352 lb)  BMI 53.53 kg/m2  SpO2 96%  LMP 04/23/2015  A/P  Right shoulder pain: Concern about abscess, patient has indurated tender area, marked leukocytosis - Obtained MRI of the right shoulder which showed severe muscle edema with small 5 x 6x 25 mm fluid collection overall appearance concerning for myositis due to infectious or inflammatory etiology, large glenohumeral joint effusion, possible septic arthritis - Called orthopedics, discussed in detail with Dr. Eulah Pont, will see patient today. Dr. Eulah Pont requested if possible IR can aspirate some fluid from the right shoulder abscess area, placed consult - Change antibiotics to IV vancomycin and Zosyn - Pain control   Uncontrolled diabetes mellitus - Currently npo for orthopedic evaluation, change IV fluids to normal saline, patient has not received Lantus yesterday, placed on 65 units, continue sliding scale insulin Q4HRS, diabetic coordinator consult   Temperance Kelemen M.D. [facility] 04/30/2015, 11:28 AM  Pager: [phone]

## 2015-04-30 NOTE — Op Note (Signed)
04/30/2015  8:01 PM  PATIENT:  Lindsay [name]    PRE-OPERATIVE DIAGNOSIS:  right shoulder abscess  POST-OPERATIVE DIAGNOSIS:  Same  PROCEDURE:  IRRIGATION AND DEBRIDEMENT SHOULDER, ARTHROSCOPY SHOULDER  SURGEON:  Cheryle Dark D, MD  ASSISTANT: Janace Litten, OPA-C, present and scrubbed throughout the case, critical for completion in a timely fashion, and for retraction, instrumentation, and closure.   ANESTHESIA:   gen  PREOPERATIVE INDICATIONS:  Lindsay [name] is a  34 y.o. female with a diagnosis of right shoulder abscess who failed conservative measures and elected for surgical management.    The risks benefits and alternatives were discussed with the patient preoperatively including but not limited to the risks of infection, bleeding, nerve injury, cardiopulmonary complications, the need for revision surgery, among others, and the patient was willing to proceed.  OPERATIVE IMPLANTS: none  OPERATIVE FINDINGS: purulent fluid in subQ of shoulder and septic joint  BLOOD LOSS: min  COMPLICATIONS: none  TOURNIQUET TIME: none  OPERATIVE PROCEDURE:  Patient was identified in the preoperative holding area and site was marked by me She was transported to the operating theater and placed on the table in supine position taking care to pad all bony prominences. After a preincinduction time out anesthesia was induced. The right upper extremity was prepped and draped in normal sterile fashion and a pre-incision timeout was performed.   Antibiotics were held until cultures could be obtained.  Express fluid from her previously performed bedside I and D that was done in the ER and seen to come from a pocket over her anterior shoulder as well as her lateral shoulder.  At this point I did consult Dr. Derrell Lolling a general surgery for concern of possible extension of the chest wall and he agreed to evaluate her intraoperatively. I appreciate his input in this case.  I then made a  posterior arthroscopic portal and inserted the arthroscope into the joint immediately noted was cloudy fluid [place] went material.  I thoroughly irrigated the joint with 3 L of saline.  I debrided it and subcutaneous tissues for culture. I performed an extensive debridement of multiple structures in the shoulder. Biceps was intact no arthritic changes.  There was a space over top of the subscapularis tendon that extended medially and I confirm complete decompression of the space.  I then instituted the arthroscope into the subacromial space and performed a thorough debridement here as well. I then removed the arthroscope.  I extended the bedside incision distally over top of the edge of the acromion and palpated here I did find a pocket of purulence that was evacuated I also found that this communicated with the anterior portal. Dr. Derrell Lolling extended this portal distally he probed and found a small pocket extending towards her chest wall but not over top of her chest. We both felt that these pockets were completely debrided.  I then closed her arthroscopic portal I placed packing in both the the open wounds. I performed a loose closure.  Sterile dressing was applied she was taken to the PACU in stable condition.  POST OPERATIVE PLAN: ROM as tolerate, WBAT RUE. DVT px per primary.  ID c/s as she will likely need picc and long term abx. Gen surg to assist in open wound monitoring and packing removal    This note was generated using a template and dragon dictation system. In light of that, I have reviewed the note and all aspects of it are applicable to this case. Any dictation errors are  due to the computerized dictation system.

## 2015-04-30 NOTE — Consult Note (Signed)
ORTHOPAEDIC CONSULTATION  REQUESTING PHYSICIAN: Ripudeep Krystal Eaton, MD  Chief Complaint: Right shoulder pain  HPI: Lindsay [name] is a 34 y.o. female who complains of  abscess on her left back in early April. She then presented with right shoulder pain in mid to late April.  On April 29-30 she was seen in the ER where a bedside I and D of a small abscess was performed. She's continued to have pain ever since. She notes some numbness and tingling in her hands.  She had an MRI which demonstrates fluid within the joint as well as 2 fluid collections just outside the shoulder.  Past Medical History  Diagnosis Date  . Hypertension   . Diabetes mellitus   . Asthma   . Obesity    Past Surgical History  Procedure Laterality Date  . Cesarean section    . Tonsillectomy    . Leg surgery     History   Social History  . Marital Status: Single    Spouse Name: N/A  . Number of Children: N/A  . Years of Education: N/A   Social History Main Topics  . Smoking status: Never Smoker   . Smokeless tobacco: Not on file  . Alcohol Use: Yes     Comment: socially  . Drug Use: No  . Sexual Activity: Not on file   Other Topics Concern  . None   Social History Narrative   Family History  Problem Relation Age of Onset  . Diabetes Mother   . Hypertension Mother   . Heart disease Mother   . Diabetes Father   . Heart disease Father   . Stroke Maternal Grandmother   . Cancer Maternal Grandmother    No Known Allergies Prior to Admission medications   Medication Sig Start Date End Date Taking? Authorizing Provider  aspirin 81 MG tablet Take 81 mg by mouth daily.   Yes Historical Provider, MD  doxycycline (VIBRAMYCIN) 100 MG capsule Take 1 capsule (100 mg total) by mouth 2 (two) times daily. 01/29/46  Yes Delora Fuel, MD  gabapentin (NEURONTIN) 300 MG capsule Take 1 capsule (300 mg total) by mouth 3 (three) times daily. 02/13/15  Yes Lorayne Marek, MD  insulin aspart (NOVOLOG) 100  UNIT/ML FlexPen Inject 20 units with breakfast and lunch, and 25 units before supper daily, subcutaneous 02/13/15  Yes Deepak Advani, MD  insulin glargine (LANTUS) 100 unit/mL SOPN Inject 0.65 mLs (65 Units total) into the skin at bedtime. 02/13/15  Yes Lorayne Marek, MD  lisinopril (PRINIVIL,ZESTRIL) 5 MG tablet Take 1 tablet (5 mg total) by mouth daily. 02/13/15  Yes Lorayne Marek, MD  meloxicam (MOBIC) 15 MG tablet Take 1 tablet (15 mg total) by mouth daily. 04/19/15  Yes Kaitlyn Szekalski, PA-C  metFORMIN (GLUCOPHAGE) 1000 MG tablet Take 1 tablet (1,000 mg total) by mouth 2 (two) times daily with a meal. 02/13/15  Yes Deepak Advani, MD  oxyCODONE-acetaminophen (PERCOCET) 5-325 MG per tablet Take 1-2 tablets by mouth every 4 (four) hours as needed for moderate pain. 05/25/45  Yes Delora Fuel, MD  Vitamin D, Ergocalciferol, (DRISDOL) 50000 UNITS CAPS capsule Take 1 capsule (50,000 Units total) by mouth every 7 (seven) days. 07/18/14  Yes Lorayne Marek, MD   Mr Shoulder Right Wo Contrast  04/30/2015   CLINICAL DATA:  Right shoulder pain. Recent history of antibiotic use. Patient was in too much pain to continue with the contrasted portion of the exam.  EXAM: MRI OF THE RIGHT SHOULDER  WITHOUT CONTRAST  TECHNIQUE: Multiplanar, multisequence MR imaging of the shoulder was performed. No intravenous contrast was administered.  COMPARISON:  None.  FINDINGS: Rotator cuff: Mild tendinosis of the supraspinatus and infraspinatus tendon. Teres minor tendon is intact. Subscapularis tendon is intact.  Muscles: There is severe muscle edema in the subscapularis muscle with small 5 x 6 x 25 mm fluid collection. There is severe muscle edema in the anterior and posterior deltoid muscles with a 14 mm fluid collection in the deltoid muscle. There is soft tissue edema superficial to the subscapularis and infraspinatus muscles. There is mild muscle edema along the periphery of the teres minor muscle.  Biceps long head:  Intact.   Acromioclavicular Joint: Mild degenerative change of the acromioclavicular joint. Type II acromion.  Glenohumeral Joint: Large joint effusion.  No chondral defect.  Labrum:  Intact.  Bones: No focal marrow signal abnormality. No fracture or dislocation.  IMPRESSION: 1. Severe muscle edema in the subscapularis muscle with small 5 x 6 x 25 mm fluid collection. There is severe muscle edema in the anterior and posterior deltoid muscles with a 14 mm fluid collection in the deltoid muscle. Soft tissue edema superficial to the subscapularis and infraspinatus muscles. Mild muscle edema along the periphery of the teres minor muscle. The overall appearance is most concerning for myositis which may be secondary to an infectious or inflammatory etiology. The small fluid collections make the process more concerning for an infectious etiology. 2. Large glenohumeral joint effusion. Septic arthritis cannot be excluded.   Electronically Signed   By: Kathreen Devoid   On: 04/30/2015 10:45    Positive ROS: All other systems have been reviewed and were otherwise negative with the exception of those mentioned in the HPI and as above.  Labs cbc  Recent Labs  04/30/15 0123 04/30/15 0627  WBC 18.2* 17.1*  HGB 10.7* 10.2*  HCT 32.5* 31.3*  PLT 353 343    Labs inflam No results for input(s): CRP in the last 72 hours.  Invalid input(s): ESR  Labs coag No results for input(s): INR, PTT in the last 72 hours.  Invalid input(s): PT   Recent Labs  04/30/15 0123 04/30/15 0627  NA 121* 123*  K 4.2 4.0  CL 86* 88*  CO2 23 23  GLUCOSE 438* 414*  BUN 18 19  CREATININE 1.05* 0.89  CALCIUM 8.3* 8.3*    Physical Exam: Filed Vitals:   04/30/15 1107  BP: 127/65  Pulse: 105  Temp:   Resp: 20   General: Alert, no acute distress Cardiovascular: No pedal edema Respiratory: No cyanosis, no use of accessory musculature GI: No organomegaly, abdomen is soft and non-tender Skin: No lesions in the area of chief  complaint other than those listed below in MSK exam.  Neurologic: Sensation intact distally Psychiatric: Patient is competent for consent with normal mood and affect Lymphatic: No axillary or cervical lymphadenopathy  MUSCULOSKELETAL:  Right lower extremity she has intact sensation but decreased in all nerve distributions. She has a little bit of weakness with elbow flexion although this is likely due to pain per her report. She has good wrist extension flexion and interosseous muscle function.  She has a small 1cm incision over her anterior superior shoulder there is draining purulent fluid.  Other extremities are atraumatic with painless ROM and NVI.  Assessment: She has 2 fluid collections in her superficial shoulder there likely purulent due to the drainage noted. There is fluid in the shoulder joint itself. This is likely  a septic shoulder.  Plan: Surgical arthroscopic and open I&D today     Renette Butters, MD Cell [phone]   04/30/2015 2:05 PM

## 2015-04-30 NOTE — Anesthesia Postprocedure Evaluation (Signed)
  Anesthesia Post-op Note  Patient: Lindsay [name]  Procedure(s) Performed: Procedure(s): IRRIGATION AND DEBRIDEMENT SHOULDER (Right) ARTHROSCOPY SHOULDER (Right)  Patient Location: PACU  Anesthesia Type:General  Level of Consciousness: awake, alert  and oriented  Airway and Oxygen Therapy: Patient Spontanous Breathing and Patient connected to nasal cannula oxygen  Post-op Pain: mild  Post-op Assessment: Post-op Vital signs reviewed, Patient's Cardiovascular Status Stable, Respiratory Function Stable, Patent Airway and Pain level controlled  Post-op Vital Signs: stable  Last Vitals:  Filed Vitals:   04/30/15 2130  BP: 122/71  Pulse: 127  Temp: 37.7 C  Resp: 21    Complications: No apparent anesthesia complications

## 2015-04-30 NOTE — Anesthesia Preprocedure Evaluation (Signed)
Anesthesia Evaluation  Patient identified by MRN, date of birth, ID band Patient awake    Reviewed: Allergy & Precautions, NPO status , Patient's Chart, lab work & pertinent test results  History of Anesthesia Complications Negative for: history of anesthetic complications  Airway Mallampati: II  TM Distance: >3 FB Neck ROM: Full    Dental  (+) Teeth Intact   Pulmonary neg pulmonary ROS,  breath sounds clear to auscultation        Cardiovascular hypertension, Pt. on medications - angina- CAD, - Past MI and - CHF - dysrhythmias Rhythm:Regular     Neuro/Psych negative neurological ROS  negative psych ROS   GI/Hepatic negative GI ROS, (+) Hepatitis -, B  Endo/Other  diabetes, Type 2, Insulin Dependent, Oral Hypoglycemic AgentsMorbid obesity  Renal/GU negative Renal ROS     Musculoskeletal Septic right shoulder   Abdominal   Peds  Hematology negative hematology ROS (+)   Anesthesia Other Findings   Reproductive/Obstetrics                             Anesthesia Physical Anesthesia Plan  ASA: III  Anesthesia Plan: General   Post-op Pain Management:    Induction: Intravenous  Airway Management Planned: Oral ETT  Additional Equipment: None  Intra-op Plan:   Post-operative Plan: Extubation in OR  Informed Consent: I have reviewed the patients History and Physical, chart, labs and discussed the procedure including the risks, benefits and alternatives for the proposed anesthesia with the patient or authorized representative who has indicated his/her understanding and acceptance.   Dental advisory given  Plan Discussed with: CRNA and Surgeon  Anesthesia Plan Comments:         Anesthesia Quick Evaluation

## 2015-04-30 NOTE — Progress Notes (Addendum)
ANTIBIOTIC CONSULT NOTE - INITIAL  Pharmacy Consult for vancomycin Indication: cellulitis  No Known Allergies  Patient Measurements: Height: 5\' 8"  (172.7 cm) Weight: (!) 343 lb (155.584 kg) IBW/kg (Calculated) : 63.9  Vital Signs: Temp: 98.2 F (36.8 C) (05/09 2109) Temp Source: Oral (05/09 2109) BP: 118/76 mmHg (05/10 0407) Pulse Rate: 111 (05/10 0200)  Labs:  Recent Labs  04/30/15 0123  WBC 18.2*  HGB 10.7*  PLT 353  CREATININE 1.05*   Estimated Creatinine Clearance: 121 mL/min (by C-G formula based on Cr of 1.05).   Microbiology: Recent Results (from the past 720 hour(s))  Culture, routine-abscess     Status: None (Preliminary result)   Collection Time: 04/27/15  6:36 AM  Result Value Ref Range Status   Specimen Description ABSCESS RIGHT SHOULDER  Final   Special Requests NONE  Final   Gram Stain   Final    FEW WBC PRESENT,BOTH PMN AND MONONUCLEAR RARE GRAM NEGATIVE RODS Performed at [facility]    Culture   Final    NO GROWTH 2 DAYS Performed at [facility]    Report Status PENDING  Incomplete    Medical History: Past Medical History  Diagnosis Date  . Hypertension   . Diabetes mellitus   . Asthma   . Obesity     Medications:  Prescriptions prior to admission  Medication Sig Dispense Refill Last Dose  . aspirin 81 MG tablet Take 81 mg by mouth daily.   04/29/2015 at Unknown time  . doxycycline (VIBRAMYCIN) 100 MG capsule Take 1 capsule (100 mg total) by mouth 2 (two) times daily. 20 capsule 0 04/29/2015 at Unknown time  . gabapentin (NEURONTIN) 300 MG capsule Take 1 capsule (300 mg total) by mouth 3 (three) times daily. 90 capsule 3 04/29/2015 at Unknown time  . insulin aspart (NOVOLOG) 100 UNIT/ML FlexPen Inject 20 units with breakfast and lunch, and 25 units before supper daily, subcutaneous 15 pen 3 04/29/2015 at Unknown time  . insulin glargine (LANTUS) 100 unit/mL SOPN Inject 0.65 mLs (65 Units total) into the skin at bedtime. 15  pen 3 04/28/2015 at Unknown time  . lisinopril (PRINIVIL,ZESTRIL) 5 MG tablet Take 1 tablet (5 mg total) by mouth daily. 90 tablet 3 04/29/2015 at Unknown time  . meloxicam (MOBIC) 15 MG tablet Take 1 tablet (15 mg total) by mouth daily. 20 tablet 0 04/29/2015 at Unknown time  . metFORMIN (GLUCOPHAGE) 1000 MG tablet Take 1 tablet (1,000 mg total) by mouth 2 (two) times daily with a meal. 180 tablet 3 04/29/2015 at Unknown time  . oxyCODONE-acetaminophen (PERCOCET) 5-325 MG per tablet Take 1-2 tablets by mouth every 4 (four) hours as needed for moderate pain. 20 tablet 0 04/28/2015 at Unknown time  . Vitamin D, Ergocalciferol, (DRISDOL) 50000 UNITS CAPS capsule Take 1 capsule (50,000 Units total) by mouth every 7 (seven) days. 12 capsule 0 04/29/2015 at Unknown time   Scheduled:  . aspirin  81 mg Oral Daily  . cefTRIAXone (ROCEPHIN)  IV  2 g Intravenous Q24H  . cefTRIAXone (ROCEPHIN)  IV  2 g Intravenous Once  . docusate sodium  100 mg Oral BID  . gabapentin  300 mg Oral TID  . heparin  5,000 Units Subcutaneous 3 times per day  . lisinopril  5 mg Oral Daily  . Vitamin D (Ergocalciferol)  50,000 Units Oral Q7 days   Infusions:  . dextrose 5 % and 0.9% NaCl      Assessment: 34yo female has  abscess in shoulder, had I&D two days ago and sent home on doxycycline which she states has been compliant on, has not been able to obtain appt w/ specialist, to begin IV ABX.  Goal of Therapy:  Vancomycin trough level 10-15 mcg/ml  Plan:  Rec'd vanc 1g in ED; will give additional vancomycin  IV for total load of 2.5g then begin vancomycin  IV Q8H and monitor CBC, Cx, levels prn.  Vernard Gambles, PharmD, BCPS  04/30/2015,4:31 AM  Addendum: Will also add zosyn, renal function is good, crcl > 100 ml/min.  - Zosyn 3.375g IV Q 8 hrs (4 hr infusion)  Bayard Hugger, PharmD, BCPS  Clinical Pharmacist  Pager: [phone]

## 2015-04-30 NOTE — Progress Notes (Signed)
Report given to Debbie, RN.

## 2015-04-30 NOTE — Progress Notes (Signed)
Inpatient Diabetes Program Recommendations  AACE/ADA: New Consensus Statement on Inpatient Glycemic Control (2013)  Target Ranges:  Prepandial:   less than 140 mg/dL      Peak postprandial:   less than 180 mg/dL (1-2 hours)      Critically ill patients:  140 - 180 mg/dL   Reason for Assessment:  Results for MOZELLA, FLEISHMAN (MRN [id number]) as of 04/30/2015 10:54  Ref. Range 04/30/2015 04:34 04/30/2015 07:55  Glucose-Capillary Latest Ref Range: 70-99 mg/dL 510 (H) 258 (H)  Results for EYLA, BOOKWALTER (MRN [id number]) as of 04/30/2015 10:54  Ref. Range 02/13/2015 15:36  Hemoglobin A1C Unknown 12.50   Diabetes history: Type 2 diabetes Outpatient Diabetes medications:  Novolog 20 units with breakfast and lunch/ Novolog 25 units with supper, Lantus 65 units q HS Current orders for Inpatient glycemic control:  Lantus 60 units daily, Novolog resist. q 4 hours  Note that patient is currently NPO.  Agree with current ordered medication regimen.  Note that A1C in February was 12.5%.  May be helpful to reassess A1C results while in the hospital.  Will follow.  Thanks, Beryl Meager, RN, BC-ADM Inpatient Diabetes Coordinator Pager [phone] (8a-5p)

## 2015-04-30 NOTE — H&P (Signed)
[facility] History and Physical  Lindsay [name] OVF:[id number] DOB: [birth year]    PCP:   Doris Cheadle, MD   Chief Complaint: increase right shoulder pain.   HPI: Lindsay [name] is an 34 y.o. female with hx of obesity, asthma, DM, HTN, presented to the ER again, as her right shoulder was not getting better.  She was seen in the ER, and had a small I and D, and placed on Doxycycline.   She has increase pain, but no subjective fever or chills.  Evaluation in the ER included a marked leukocytosis, with WBC of 18K, and elevated BS to the 400.   Her prior CT did not show any abscesses.  She was started on IV Vancomycin, and hospitalist was asked to admit her for cellulitis and hyperglycemia.   Rewiew of Systems:  Constitutional: Negative for malaise, fever and chills. No significant weight loss or weight gain Eyes: Negative for eye pain, redness and discharge, diplopia, visual changes, or flashes of light. ENMT: Negative for ear pain, hoarseness, nasal congestion, sinus pressure and sore throat. No headaches; tinnitus, drooling, or problem swallowing. Cardiovascular: Negative for chest pain, palpitations, diaphoresis, dyspnea and peripheral edema. ; No orthopnea, PND Respiratory: Negative for cough, hemoptysis, wheezing and stridor. No pleuritic chestpain. Gastrointestinal: Negative for nausea, vomiting, diarrhea, constipation, abdominal pain, melena, blood in stool, hematemesis, jaundice and rectal bleeding.    Genitourinary: Negative for frequency, dysuria, incontinence,flank pain and hematuria; Musculoskeletal: Negative for back pain and neck pain.  Skin: . Negative for pruritus, rash, abrasions, bruising and skin lesion.; ulcerations Neuro: Negative for headache, lightheadedness and neck stiffness. Negative for weakness, altered level of consciousness , altered mental status, extremity weakness, burning feet, involuntary movement, seizure and syncope.  Psych: negative for anxiety,  depression, insomnia, tearfulness, panic attacks, hallucinations, paranoia, suicidal or homicidal ideation    Past Medical History  Diagnosis Date  . Hypertension   . Diabetes mellitus   . Asthma   . Obesity     Past Surgical History  Procedure Laterality Date  . Cesarean section    . Tonsillectomy    . Leg surgery      Medications:  HOME MEDS: Prior to Admission medications   Medication Sig Start Date End Date Taking? Authorizing Provider  aspirin 81 MG tablet Take 81 mg by mouth daily.   Yes Historical Provider, MD  doxycycline (VIBRAMYCIN) 100 MG capsule Take 1 capsule (100 mg total) by mouth 2 (two) times daily. 04/27/15  Yes Dione Booze, MD  gabapentin (NEURONTIN) 300 MG capsule Take 1 capsule (300 mg total) by mouth 3 (three) times daily. 02/13/15  Yes Doris Cheadle, MD  insulin aspart (NOVOLOG) 100 UNIT/ML FlexPen Inject 20 units with breakfast and lunch, and 25 units before supper daily, subcutaneous 02/13/15  Yes Deepak Advani, MD  insulin glargine (LANTUS) 100 unit/mL SOPN Inject 0.65 mLs (65 Units total) into the skin at bedtime. 02/13/15  Yes Doris Cheadle, MD  lisinopril (PRINIVIL,ZESTRIL) 5 MG tablet Take 1 tablet (5 mg total) by mouth daily. 02/13/15  Yes Doris Cheadle, MD  meloxicam (MOBIC) 15 MG tablet Take 1 tablet (15 mg total) by mouth daily. 04/19/15  Yes Kaitlyn Szekalski, PA-C  metFORMIN (GLUCOPHAGE) 1000 MG tablet Take 1 tablet (1,000 mg total) by mouth 2 (two) times daily with a meal. 02/13/15  Yes Deepak Advani, MD  oxyCODONE-acetaminophen (PERCOCET) 5-325 MG per tablet Take 1-2 tablets by mouth every 4 (four) hours as needed for moderate pain. 04/27/15  Yes Dione Booze,  MD  Vitamin D, Ergocalciferol, (DRISDOL) 50000 UNITS CAPS capsule Take 1 capsule (50,000 Units total) by mouth every 7 (seven) days. 07/18/14  Yes Doris Cheadle, MD     Allergies:  No Known Allergies  Social History:   reports that she has never smoked. She does not have any smokeless tobacco  history on file. She reports that she drinks alcohol. She reports that she does not use illicit drugs.  Family History: Family History  Problem Relation Age of Onset  . Diabetes Mother   . Hypertension Mother   . Heart disease Mother   . Diabetes Father   . Heart disease Father   . Stroke Maternal Grandmother   . Cancer Maternal Grandmother      Physical Exam: Filed Vitals:   04/29/15 2109 04/30/15 0151 04/30/15 0152  BP: 120/72 113/61   Pulse: 128  113  Temp: 98.2 F (36.8 C)    TempSrc: Oral    Resp: 20    Height:  (1.727 m)    Weight: 155.584 kg (343 lb)    SpO2: 96%  94%   Blood pressure 113/61, pulse 113, temperature 98.2 F (36.8 C), temperature source Oral, resp. rate 20, height  (1.727 m), weight 155.584 kg (343 lb), last menstrual period 04/23/2015, SpO2 94 %.  GEN:  Pleasant patient lying in the stretcher in no acute distress; cooperative with exam. PSYCH:  alert and oriented x4; does not appear anxious or depressed; affect is appropriate. HEENT: Mucous membranes pink and anicteric; PERRLA; EOM intact; no cervical lymphadenopathy nor thyromegaly or carotid bruit; no JVD; There were no stridor. Neck is very supple. Breasts:: Not examined CHEST WALL: No tenderness CHEST: Normal respiration, clear to auscultation bilaterally.  HEART: Regular rate and rhythm.  There are no murmur, rub, or gallops.   BACK: No kyphosis or scoliosis; no CVA tenderness ABDOMEN: soft and non-tender; no masses, no organomegaly, normal abdominal bowel sounds; no pannus; no intertriginous candida. There is no rebound and no distention. Rectal Exam: Not done EXTREMITIES: there is tenderness and swelling, and slight induration of the right shoulder.  age-appropriate arthropathy of the hands and knees; no edema; no ulcerations.  There is no calf tenderness. Genitalia: not examined PULSES: 2+ and symmetric SKIN: Normal hydration no rash or ulceration CNS: Cranial nerves 2-12 grossly  intact no focal lateralizing neurologic deficit.  Speech is fluent; uvula elevated with phonation, facial symmetry and tongue midline. DTR are normal bilaterally, cerebella exam is intact, barbinski is negative and strengths are equaled bilaterally.  No sensory loss.   Labs on Admission:  Basic Metabolic Panel:  Recent Labs Lab 04/27/15 0244 04/30/15 0123  NA 126* 121*  K 4.8 4.2  CL 92* 86*  CO2 21* 23  GLUCOSE 432* 438*  BUN 9 18  CREATININE 0.76 1.05*  CALCIUM 8.9 8.3*   Liver Function Tests:  Recent Labs Lab 04/30/15 0123  AST 19  ALT 14  ALKPHOS 170*  BILITOT 0.7  PROT 7.4  ALBUMIN 1.9*   No results for input(s): LIPASE, AMYLASE in the last 168 hours. No results for input(s): AMMONIA in the last 168 hours. CBC:  Recent Labs Lab 04/27/15 0244 04/30/15 0123  WBC 13.6* 18.2*  NEUTROABS 10.5* 0.0*  HGB 11.5* 10.7*  HCT 34.2* 32.5*  MCV 74.0* 75.1*  PLT 377 353    Assessment/Plan Present on Admission:  . Cellulitis of left upper extremity . Diabetes mellitus due to underlying condition without complications . Obesity . HTN (hypertension) .  Cellulitis  PLAN:  Will admit her for cellulitis of the right shoulder.  Her prior CT did not show any abscess, but it is possible that she has one now.  I will add Rocephin 2 g IV q day to her IV Vancomycin.  Will give her some IV Pain meds as well.  Hold her metformin, and use SSI for her DM.  Please consult orthopedics today as she may need to have surgery.  For now, will give IVF and made NPO. She is stable, full code, and will be admtited to [facility] service. Thank you for allowing me to participate in her care.   Other plans as per orders.  Code Status: FULL Unk Lightning, MD. [facility] Pager [phone] 7pm to 7am.  04/30/2015, 3:31 AM

## 2015-04-30 NOTE — ED Provider Notes (Signed)
CSN: [id number]     Arrival date & time 04/29/15  2058 History   First MD Initiated Contact with Patient 04/30/15 0100     This chart was scribed for Dione Booze, MD by Arlan Organ, ED Scribe. This patient was seen in room B14C/B14C and the patient's care was started 1:05 AM.   Chief Complaint  Patient presents with  . Shoulder Pain   The history is provided by the patient. No language interpreter was used.    HPI Comments: Lindsay [name] is a 34 y.o. female with a PMHx of HTN, DM, and asthma who presents to the Emergency Department complaining of constant, ongoing, improving R shoulder pain x 12 days. Pt was seen for same 2 days ago and was diagnosed with an abscess to the R shoulder requiring incision and drainage as treatment plan. Pt was sent home with a prescription for Doxycycline which she has been taking as prescribed and directed. In addition, pt was encouraged to follow up with orthropedics but states she was unable to get an appointment. Since last visit, pt has noted new onset numbness to the R upper arm. Lindsay [name] has tried warm compresses to area which has helped with noticeable drainage. No recent fever or chills. No known allergies to medications.  Past Medical History  Diagnosis Date  . Hypertension   . Diabetes mellitus   . Asthma   . Obesity    Past Surgical History  Procedure Laterality Date  . Cesarean section    . Tonsillectomy    . Leg surgery     Family History  Problem Relation Age of Onset  . Diabetes Mother   . Hypertension Mother   . Heart disease Mother   . Diabetes Father   . Heart disease Father   . Stroke Maternal Grandmother   . Cancer Maternal Grandmother    History  Substance Use Topics  . Smoking status: Never Smoker   . Smokeless tobacco: Not on file  . Alcohol Use: Yes     Comment: socially   OB History    No data available     Review of Systems  Constitutional: Negative for fever and chills.  Respiratory: Negative for  shortness of breath.   Musculoskeletal: Positive for arthralgias.  Skin: Positive for wound. Negative for rash.  Neurological: Positive for numbness.  Psychiatric/Behavioral: Negative for confusion.      Allergies  Review of patient's allergies indicates no known allergies.  Home Medications   Prior to Admission medications   Medication Sig Start Date End Date Taking? Authorizing Provider  aspirin 81 MG tablet Take 81 mg by mouth daily.   Yes Historical Provider, MD  doxycycline (VIBRAMYCIN) 100 MG capsule Take 1 capsule (100 mg total) by mouth 2 (two) times daily. 04/27/15  Yes Dione Booze, MD  gabapentin (NEURONTIN) 300 MG capsule Take 1 capsule (300 mg total) by mouth 3 (three) times daily. 02/13/15  Yes Doris Cheadle, MD  insulin aspart (NOVOLOG) 100 UNIT/ML FlexPen Inject 20 units with breakfast and lunch, and 25 units before supper daily, subcutaneous 02/13/15  Yes Deepak Advani, MD  insulin glargine (LANTUS) 100 unit/mL SOPN Inject 0.65 mLs (65 Units total) into the skin at bedtime. 02/13/15  Yes Doris Cheadle, MD  lisinopril (PRINIVIL,ZESTRIL) 5 MG tablet Take 1 tablet (5 mg total) by mouth daily. 02/13/15  Yes Doris Cheadle, MD  meloxicam (MOBIC) 15 MG tablet Take 1 tablet (15 mg total) by mouth daily. 04/19/15  Yes Emilia Beck, PA-C  metFORMIN (GLUCOPHAGE) 1000 MG tablet Take 1 tablet (1,000 mg total) by mouth 2 (two) times daily with a meal. 02/13/15  Yes Deepak Advani, MD  oxyCODONE-acetaminophen (PERCOCET) 5-325 MG per tablet Take 1-2 tablets by mouth every 4 (four) hours as needed for moderate pain. 04/27/15  Yes Dione Booze, MD  Vitamin D, Ergocalciferol, (DRISDOL) 50000 UNITS CAPS capsule Take 1 capsule (50,000 Units total) by mouth every 7 (seven) days. 07/18/14  Yes Doris Cheadle, MD   Triage Vitals: BP 120/72 mmHg  Pulse 128  Temp(Src) 98.2 F (36.8 C) (Oral)  Resp 20  Ht 5\' 8"  (1.727 m)  Wt 343 lb (155.584 kg)  BMI 52.17 kg/m2  SpO2 96%  LMP 04/23/2015   Physical  Exam  Constitutional: She is oriented to person, place, and time. She appears well-developed and well-nourished. No distress.  Morbidly obese  HENT:  Head: Normocephalic and atraumatic.  Eyes: EOM are normal. Pupils are equal, round, and reactive to light.  Neck: Normal range of motion. Neck supple. No JVD present.  Cardiovascular: Regular rhythm.   Murmur heard. Tachycardic. 2/6 systolic ejection murmur along the left sternal border.  Pulmonary/Chest: Effort normal and breath sounds normal.  Abdominal: Soft. Bowel sounds are normal. She exhibits no distension and no mass. There is no tenderness.  Musculoskeletal: She exhibits edema and tenderness.  2 plus pitting edema noted to lower extremities Incision site to R shoulder with slight purulent drainage Mark tenderness to palpation to R upper arm Pain to R supraclavicular and infraclavicular regions No fluctuance noted Pain on passive ROM of the R shoulder  Lymphadenopathy:    She has no cervical adenopathy.  Neurological: She is alert and oriented to person, place, and time. No cranial nerve deficit. She exhibits normal muscle tone. Coordination normal.  Neurovascularly intact to R shoulder  Skin: Skin is warm and dry. No rash noted.  Psychiatric: She has a normal mood and affect. Her behavior is normal. Judgment and thought content normal.  Nursing note and vitals reviewed.   ED Course  Procedures (including critical care time)  DIAGNOSTIC STUDIES: Oxygen Saturation is 96% on RA, adequate by my interpretation.    COORDINATION OF CARE: 1:18 AM- Will give Morphine and Vancocin. Will order sedimentation rate, CMP, urinalysis, CBC, i-stat CB4 lactic acid. Discussed treatment plan with pt at bedside and pt agreed to plan.     Labs Review Results for orders placed or performed during the hospital encounter of 04/30/15  CBC with Differential  Result Value Ref Range   WBC 18.2 (H) 4.0 - 10.5 K/uL   RBC 4.33 3.87 - 5.11 MIL/uL    Hemoglobin 10.7 (L) 12.0 - 15.0 g/dL   HCT 33.0 (L) 07.6 - 22.6 %   MCV 75.1 (L) 78.0 - 100.0 fL   MCH 24.7 (L) 26.0 - 34.0 pg   MCHC 32.9 30.0 - 36.0 g/dL   RDW 33.3 54.5 - 62.5 %   Platelets 353 150 - 400 K/uL   nRBC 0 0 /100 WBC   Neutrophils Relative % 0 (L) 43 - 77 %   Lymphocytes Relative 0 (L) 12 - 46 %   Monocytes Relative 0 (L) 3 - 12 %   Eosinophils Relative 0 0 - 5 %   Basophils Relative 0 0 - 1 %   Band Neutrophils 0 0 - 10 %   Metamyelocytes Relative 0 %   Myelocytes 0 %   Promyelocytes Absolute 0 %   Blasts 0 %   Neutro  Abs 0.0 (L) 1.7 - 7.7 K/uL   Lymphs Abs 0.0 (L) 0.7 - 4.0 K/uL   Monocytes Absolute 0.0 (L) 0.1 - 1.0 K/uL   Eosinophils Absolute 0.0 0.0 - 0.7 K/uL   Basophils Absolute 0.0 0.0 - 0.1 K/uL   WBC Morphology MILD LEFT SHIFT (1-5% METAS, OCC MYELO, OCC BANDS)   Comprehensive metabolic panel  Result Value Ref Range   Sodium 121 (L) 135 - 145 mmol/L   Potassium 4.2 3.5 - 5.1 mmol/L   Chloride 86 (L) 101 - 111 mmol/L   CO2 23 22 - 32 mmol/L   Glucose, Bld 438 (H) 70 - 99 mg/dL   BUN 18 6 - 20 mg/dL   Creatinine, Ser 5.46 (H) 0.44 - 1.00 mg/dL   Calcium 8.3 (L) 8.9 - 10.3 mg/dL   Total Protein 7.4 6.5 - 8.1 g/dL   Albumin 1.9 (L) 3.5 - 5.0 g/dL   AST 19 15 - 41 U/L   ALT 14 14 - 54 U/L   Alkaline Phosphatase 170 (H) 38 - 126 U/L   Total Bilirubin 0.7 0.3 - 1.2 mg/dL   GFR calc non Af Amer >60 >60 mL/min   GFR calc Af Amer >60 >60 mL/min   Anion gap 12 5 - 15  Urinalysis, Routine w reflex microscopic  Result Value Ref Range   Color, Urine AMBER (A) YELLOW   APPearance CLOUDY (A) CLEAR   Specific Gravity, Urine 1.027 1.005 - 1.030   pH 5.0 5.0 - 8.0   Glucose, UA >1000 (A) NEGATIVE mg/dL   Hgb urine dipstick MODERATE (A) NEGATIVE   Bilirubin Urine SMALL (A) NEGATIVE   Ketones, ur 15 (A) NEGATIVE mg/dL   Protein, ur 568 (A) NEGATIVE mg/dL   Urobilinogen, UA 1.0 0.0 - 1.0 mg/dL   Nitrite NEGATIVE NEGATIVE   Leukocytes, UA TRACE (A) NEGATIVE   Sedimentation rate  Result Value Ref Range   Sed Rate 125 (H) 0 - 22 mm/hr  Urine microscopic-add on  Result Value Ref Range   Squamous Epithelial / LPF RARE RARE   WBC, UA 7-10 <3 WBC/hpf   RBC / HPF 7-10 <3 RBC/hpf   Bacteria, UA FEW (A) RARE   Casts HYALINE CASTS (A) NEGATIVE   Urine-Other RARE YEAST   CBC  Result Value Ref Range   WBC 17.1 (H) 4.0 - 10.5 K/uL   RBC 4.19 3.87 - 5.11 MIL/uL   Hemoglobin 10.2 (L) 12.0 - 15.0 g/dL   HCT 12.7 (L) 51.7 - 00.1 %   MCV 74.7 (L) 78.0 - 100.0 fL   MCH 24.3 (L) 26.0 - 34.0 pg   MCHC 32.6 30.0 - 36.0 g/dL   RDW 74.9 44.9 - 67.5 %   Platelets 343 150 - 400 K/uL  Basic metabolic panel  Result Value Ref Range   Sodium 123 (L) 135 - 145 mmol/L   Potassium 4.0 3.5 - 5.1 mmol/L   Chloride 88 (L) 101 - 111 mmol/L   CO2 23 22 - 32 mmol/L   Glucose, Bld 414 (H) 70 - 99 mg/dL   BUN 19 6 - 20 mg/dL   Creatinine, Ser 9.16 0.44 - 1.00 mg/dL   Calcium 8.3 (L) 8.9 - 10.3 mg/dL   GFR calc non Af Amer >60 >60 mL/min   GFR calc Af Amer >60 >60 mL/min   Anion gap 12 5 - 15  Glucose, capillary  Result Value Ref Range   Glucose-Capillary 424 (H) 70 - 99 mg/dL  Glucose, capillary  Result Value Ref Range   Glucose-Capillary 376 (H) 70 - 99 mg/dL  I-Stat CG4 Lactic Acid, ED  Result Value Ref Range   Lactic Acid, Venous 1.57 0.5 - 2.0 mmol/L   MDM   Final diagnoses:  Abscess of right shoulder  Cellulitis of right upper arm  Hyponatremia  Hyperglycemia    Abscess of the right shoulder. She still has significant swelling and pain. Old records are reviewed and this was incised and drained 2 days ago. There is still drainage but she has progressive hyponatremia and leukocytosis. She had been referred to orthopedics but was unable to be seen. I'm still concerned that this may need more extensive drainage. In any case, she is essentially failed outpatient management. Case is discussed with Dr. Conley Rolls [facility] who agrees to admit the  patient.  I personally performed the services described in this documentation, which was scribed in my presence. The recorded information has been reviewed and is accurate.      Dione Booze, MD 04/30/15 [id number]

## 2015-04-30 NOTE — Anesthesia Procedure Notes (Signed)
Procedure Name: Intubation Date/Time: 04/30/2015 6:46 PM Performed by: Molli Hazard Pre-anesthesia Checklist: Patient identified, Emergency Drugs available, Suction available and Patient being monitored Patient Re-evaluated:Patient Re-evaluated prior to inductionOxygen Delivery Method: Circle system utilized Preoxygenation: Pre-oxygenation with 100% oxygen Intubation Type: IV induction Ventilation: Mask ventilation without difficulty and Oral airway inserted - appropriate to patient size Laryngoscope Size: Hyacinth Meeker and 2 Grade View: Grade II Tube type: Oral Tube size: 7.0 mm Number of attempts: 1 Airway Equipment and Method: Stylet Placement Confirmation: ETT inserted through vocal cords under direct vision,  positive ETCO2 and breath sounds checked- equal and bilateral Secured at: 22 cm Tube secured with: Tape Dental Injury: Teeth and Oropharynx as per pre-operative assessment

## 2015-05-01 ENCOUNTER — Encounter (HOSPITAL_COMMUNITY): Payer: Self-pay | Admitting: Orthopedic Surgery

## 2015-05-01 HISTORY — PX: IRRIGATION AND DEBRIDEMENT SHOULDER: SHX5880

## 2015-05-01 LAB — BASIC METABOLIC PANEL
Anion gap: 14 (ref 5–15)
BUN: 28 mg/dL — ABNORMAL HIGH (ref 6–20)
CALCIUM: 7.9 mg/dL — AB (ref 8.9–10.3)
CO2: 22 mmol/L (ref 22–32)
CREATININE: 2.28 mg/dL — AB (ref 0.44–1.00)
Chloride: 91 mmol/L — ABNORMAL LOW (ref 101–111)
GFR calc Af Amer: 31 mL/min — ABNORMAL LOW (ref >60)
GFR calc non Af Amer: 27 mL/min — ABNORMAL LOW (ref >60)
Glucose, Bld: 291 mg/dL — ABNORMAL HIGH (ref 70–99)
Potassium: 4.2 mmol/L (ref 3.5–5.1)
Sodium: 127 mmol/L — ABNORMAL LOW (ref 135–145)

## 2015-05-01 LAB — CBC
HEMATOCRIT: 30.5 % — AB (ref 36.0–46.0)
Hemoglobin: 9.7 g/dL — ABNORMAL LOW (ref 12.0–15.0)
MCH: 24.3 pg — ABNORMAL LOW (ref 26.0–34.0)
MCHC: 31.8 g/dL (ref 30.0–36.0)
MCV: 76.3 fL — AB (ref 78.0–100.0)
Platelets: 370 10*3/uL (ref 150–400)
RBC: 4 MIL/uL (ref 3.87–5.11)
RDW: 14.6 % (ref 11.5–15.5)
WBC: 18.4 10*3/uL — ABNORMAL HIGH (ref 4.0–10.5)

## 2015-05-01 LAB — HEMOGLOBIN A1C
Hgb A1c MFr Bld: 12.7 % — ABNORMAL HIGH (ref 4.8–5.6)
Mean Plasma Glucose: 318 mg/dL

## 2015-05-01 LAB — GLUCOSE, CAPILLARY
GLUCOSE-CAPILLARY: 226 mg/dL — AB (ref 70–99)
GLUCOSE-CAPILLARY: 276 mg/dL — AB (ref 70–99)
GLUCOSE-CAPILLARY: 282 mg/dL — AB (ref 70–99)
Glucose-Capillary: 304 mg/dL — ABNORMAL HIGH (ref 70–99)
Glucose-Capillary: 301 mg/dL — ABNORMAL HIGH (ref 70–99)
Glucose-Capillary: 359 mg/dL — ABNORMAL HIGH (ref 70–99)

## 2015-05-01 LAB — VANCOMYCIN, TROUGH: Vancomycin Tr: 34 ug/mL (ref 10.0–20.0)

## 2015-05-01 MED ORDER — INSULIN ASPART 100 UNIT/ML ~~LOC~~ SOLN
0.0000 [IU] | Freq: Three times a day (TID) | SUBCUTANEOUS | Status: DC
  Administered 2015-05-01: 15 [IU] via SUBCUTANEOUS
  Administered 2015-05-01: 8 [IU] via SUBCUTANEOUS
  Administered 2015-05-02: 5 [IU] via SUBCUTANEOUS

## 2015-05-01 MED ORDER — INSULIN ASPART 100 UNIT/ML ~~LOC~~ SOLN
10.0000 [IU] | Freq: Three times a day (TID) | SUBCUTANEOUS | Status: DC
  Administered 2015-05-01 – 2015-05-02 (×3): 10 [IU] via SUBCUTANEOUS

## 2015-05-01 MED ORDER — POLYETHYLENE GLYCOL 3350 17 G PO PACK
17.0000 g | PACK | Freq: Every day | ORAL | Status: DC
Start: 2015-05-01 — End: 2015-05-14
  Administered 2015-05-01 – 2015-05-13 (×9): 17 g via ORAL
  Filled 2015-05-01 (×13): qty 1

## 2015-05-01 MED ORDER — INSULIN ASPART 100 UNIT/ML ~~LOC~~ SOLN
0.0000 [IU] | Freq: Every day | SUBCUTANEOUS | Status: DC
  Administered 2015-05-01: 2 [IU] via SUBCUTANEOUS

## 2015-05-01 NOTE — Progress Notes (Addendum)
ANTIBIOTIC CONSULT NOTE - Follow-up  Pharmacy Consult for vancomycin and Zosyn Indication: shoulder infection  No Known Allergies  Patient Measurements: Height: 5\' 8"  (172.7 cm) Weight: (!) 352 lb (159.666 kg) IBW/kg (Calculated) : 63.9  Vital Signs: Temp: 98.3 F (36.8 C) (05/11 0511) BP: 103/46 mmHg (05/11 0511) Pulse Rate: 110 (05/11 0511)  Labs:  Recent Labs  04/30/15 0123 04/30/15 0627 05/01/15 0531  WBC 18.2* 17.1* 18.4*  HGB 10.7* 10.2* 9.7*  PLT 353 343 370  CREATININE 1.05* 0.89 2.28*   Estimated Creatinine Clearance: 56.6 mL/min (by C-G formula based on Cr of 2.28).   Microbiology: Recent Results (from the past 720 hour(s))  Culture, routine-abscess     Status: None (Preliminary result)   Collection Time: 04/27/15  6:36 AM  Result Value Ref Range Status   Specimen Description ABSCESS RIGHT SHOULDER  Final   Special Requests NONE  Final   Gram Stain   Final    FEW WBC PRESENT,BOTH PMN AND MONONUCLEAR RARE GRAM NEGATIVE RODS Performed at [facility]    Culture   Final    NO GROWTH 3 DAYS Note: CONTINUING TO HOLD FOR 14 DAYS Performed at [facility]    Report Status PENDING  Incomplete  Surgical pcr screen     Status: Abnormal   Collection Time: 04/30/15  3:23 PM  Result Value Ref Range Status   MRSA, PCR NEGATIVE NEGATIVE Final   Staphylococcus aureus POSITIVE (A) NEGATIVE Final    Comment:        The Xpert SA Assay (FDA approved for NASAL specimens in patients over 66 years of age), is one component of a comprehensive surveillance program.  Test performance has been validated by [facility] for patients greater than or equal to 75 year old. It is not intended to diagnose infection nor to guide or monitor treatment.   Anaerobic culture     Status: None (Preliminary result)   Collection Time: 04/30/15  7:41 PM  Result Value Ref Range Status   Specimen Description WOUND RIGHT SHOULDER  Final   Special Requests RIGHT  SHOULDER  Final   Gram Stain PENDING  Incomplete   Culture   Final    No Anaerobes Isolated; Culture in Progress for 14 DAYS Performed at [facility]    Report Status PENDING  Incomplete  Culture, routine-abscess     Status: None (Preliminary result)   Collection Time: 04/30/15  7:41 PM  Result Value Ref Range Status   Specimen Description ABSCESS RIGHT SHOULDER  Final   Special Requests PATIENT ON FOLLOWING VANCOMYCIN ZOSYN  Final   Gram Stain PENDING  Incomplete   Culture PENDING  Incomplete   Report Status PENDING  Incomplete  Anaerobic culture     Status: None (Preliminary result)   Collection Time: 04/30/15  7:47 PM  Result Value Ref Range Status   Specimen Description ABSCESS  DRAINAGE FROM RIGHT SHOULDER  Final   Special Requests PATIENT ON FOLLOWING VANCOMYCIN ZOSYN B  Final   Gram Stain PENDING  Incomplete   Culture   Final    NO ANAEROBES ISOLATED; CULTURE IN PROGRESS FOR 5 DAYS Performed at [facility]    Report Status PENDING  Incomplete  Culture, routine-abscess     Status: None (Preliminary result)   Collection Time: 04/30/15  7:47 PM  Result Value Ref Range Status   Specimen Description ABSCESS RIGHT SHOULDER DRAINAGE  Final   Special Requests   Final    PATIENT  ON FOLLOWING VANCOMYCIN ZOSYN SPECIMEN B 2 CUPS   Gram Stain PENDING  Incomplete   Culture PENDING  Incomplete   Report Status PENDING  Incomplete  Anaerobic culture     Status: None (Preliminary result)   Collection Time: 04/30/15  7:48 PM  Result Value Ref Range Status   Specimen Description FLUID RIGHT SHOULDER JOINT  Final   Special Requests PATIENT ON FOLLOWING VANCOMYCIN ZOSYN C  Final   Gram Stain PENDING  Incomplete   Culture   Final    No Anaerobes Isolated; Culture in Progress for 14 DAYS Performed at [facility]    Report Status PENDING  Incomplete  Body fluid culture     Status: None (Preliminary result)   Collection Time: 04/30/15  7:48 PM  Result Value Ref  Range Status   Specimen Description FLUID RIGHT SHOULDER JOINT  Final   Special Requests PATIENT ON FOLLOWING VANC,ZOSYN  Final   Gram Stain   Final    RARE WBC PRESENT, PREDOMINANTLY PMN NO ORGANISMS SEEN Performed at [facility]    Culture PENDING  Incomplete   Report Status PENDING  Incomplete    Assessment: 33yo female has abscess in shoulder, had I&D two days ago and sent home on doxycycline which she states has been compliant on, has not been able to obtain appt w/ specialist, to begin IV ABX.  Goal of Therapy:  Vancomycin trough level 10-15 mcg/ml  Plan:  34 y.o. female continues on Vancomycin and Zosyn (Day #2) for R shoulder cellulitis/wound infection. S/p I&D 5/10. Tm 100.7. WBC up to 18.4. ID has been consulted. SCr with large increase up to 2.28, est CrCl 55 ml/min. Wound cultures pending.  Vancomycin trough 34 mcg/ml with one dose missed last p.m. so true trough would be higher.  Plan: Hold vancomycin for now Will f/u repeat level in ~24h Continue Zosyn 3.375gm IV q8h - each dose over 4 hours F/u renal function, micro data, pt's clinical condition F/u ID consults   Christoper Fabian, PharmD, BCPS Clinical pharmacist, pager [phone] 05/01/2015,1:43 PM

## 2015-05-01 NOTE — Progress Notes (Signed)
Spoke to patient regarding home diabetes management.  She states that glucose levels have been much higher with infection.  She see's MD at [facility].  Her home diabetes regimen is :  Lantus 60 units daily, Novolog 20 units with breakfast and lunch and Novolog 25 units with supper.  She also takes Metformin 1000 mg bid.    If fasting CBG's remain greater than 150 mg/dL, may consider increasing Lantus to 70 units daily.  Thanks, Beryl Meager, RN, BC-ADM Inpatient Diabetes Coordinator Pager [phone] (8a-5p)

## 2015-05-01 NOTE — Progress Notes (Signed)
CRITICAL VALUE ALERT  Critical value received:  34  Date of notification:  5/11  Time of notification:  1315  Critical value read back: yes  Nurse who received alert:  Dennard Nip, RN  MD notified (1st page): Isidoro Donning, MD  Time of first page:  1315  MD notified (2nd page):  Time of second page:  Responding MD:  Isidoro Donning, MD - MD requested to contact pharmacy  Time MD responded:

## 2015-05-01 NOTE — Progress Notes (Signed)
     Subjective:  POD#1 ID of R shoulder abscess.  Patient reports pain as mild.  Patient is up sitting on the side of the bed.  She feels much improved from yesterday.    Objective:   VITALS:   Filed Vitals:   04/30/15 2130 04/30/15 2211 05/01/15 0147 05/01/15 0511  BP: 122/71 112/66 105/47 103/46  Pulse: 127 128 118 110  Temp: 99.8 F (37.7 C) 99.4 F (37.4 C) 100.7 F (38.2 C) 98.3 F (36.8 C)  TempSrc:      Resp: 21 18 19 18   Height:      Weight:      SpO2: 98% 100% 98% 91%    Neurologically intact ABD soft Neurovascular intact Sensation intact distally Intact pulses distally Incision: dressing C/D/I Right arm in sling  Lab Results  Component Value Date   WBC 18.4* 05/01/2015   HGB 9.7* 05/01/2015   HCT 30.5* 05/01/2015   MCV 76.3* 05/01/2015   PLT 370 05/01/2015   BMET    Component Value Date/Time   NA 127* 05/01/2015 0531   K 4.2 05/01/2015 0531   CL 91* 05/01/2015 0531   CO2 22 05/01/2015 0531   GLUCOSE 291* 05/01/2015 0531   BUN 28* 05/01/2015 0531   CREATININE 2.28* 05/01/2015 0531   CREATININE 0.60 02/13/2015 1607   CALCIUM 7.9* 05/01/2015 0531   GFRNONAA 27* 05/01/2015 0531   GFRNONAA >89 02/13/2015 1607   GFRAA 31* 05/01/2015 0531   GFRAA >89 02/13/2015 1607     Assessment/Plan: 1 Day Post-Op   Principal Problem:   Cellulitis of left upper extremity Active Problems:   Diabetes mellitus due to underlying condition without complications   Obesity   HTN (hypertension)   Hyponatremia   Abscess   Abscess of right shoulder   Up with therapy WBAT in the RUE, PT to work ROM as tolerated ID consulted for antibiotics, will likely need to d/c on abx General surgery is monitoring the open wound and will be responsible for removing the packing.   Lindsay [name] Hilda Lias 05/01/2015, 7:51 AM Cell [phone]

## 2015-05-01 NOTE — Progress Notes (Signed)
Patient unable to spontaneously void post op. No urge to urinate and no complaints of any bladder discomfort. Two attempts made to the bathroom without success. Bladder scan done this morning showed only 33 mls in bladder. Patient urged to increase fluid intake. Reported given to day nurse. Will continue to monitor.

## 2015-05-01 NOTE — Progress Notes (Signed)
[facility] Progress Note  1 Day Post-Op  Subjective: Pt doing okay, has pain in right shoulder.  Pain with movement.  Dressing hasn't been changed yet.  Waiting for iodoform gauze strips.  Tolerating diet.  Objective: Vital signs in last 24 hours: Temp:  [97.8 F (36.6 C)-100.7 F (38.2 C)] 98.3 F (36.8 C) (05/11 0511) Pulse Rate:  [110-128] 110 (05/11 0511) Resp:  [7-26] 18 (05/11 0511) BP: (103-178)/(46-89) 103/46 mmHg (05/11 0511) SpO2:  [89 %-100 %] 91 % (05/11 0511) Last BM Date:  (last BM pta; 2 weeks ago)  Intake/Output from previous day: 05/10 0701 - 05/11 0700 In: 1190 [P.O.:240; I.V.:900; IV Piggyback:50] Out: -  Intake/Output this shift: Total I/O In: 240 [P.O.:240] Out: -   PE: Gen:  Alert, NAD, pleasant Right shoulder/chest wall:  1 posterior incision closed with sutures, few interrupted sutures at the apex of the shoulder with part of the incision left open with 1/2 inch iodoform gauze packing in place, one anterior incision which is packed.  Drainage is serosanguinous, no purulent drainage noted.  No significant induration or fluctuance.    Lab Results:   Recent Labs  04/30/15 0627 05/01/15 0531  WBC 17.1* 18.4*  HGB 10.2* 9.7*  HCT 31.3* 30.5*  PLT 343 370   BMET  Recent Labs  04/30/15 0627 05/01/15 0531  NA 123* 127*  K 4.0 4.2  CL 88* 91*  CO2 23 22  GLUCOSE 414* 291*  BUN 19 28*  CREATININE 0.89 2.28*  CALCIUM 8.3* 7.9*   PT/INR No results for input(s): LABPROT, INR in the last 72 hours. CMP     Component Value Date/Time   NA 127* 05/01/2015 0531   K 4.2 05/01/2015 0531   CL 91* 05/01/2015 0531   CO2 22 05/01/2015 0531   GLUCOSE 291* 05/01/2015 0531   BUN 28* 05/01/2015 0531   CREATININE 2.28* 05/01/2015 0531   CREATININE 0.60 02/13/2015 1607   CALCIUM 7.9* 05/01/2015 0531   PROT 7.4 04/30/2015 0123   ALBUMIN 1.9* 04/30/2015 0123   AST 19 04/30/2015 0123   ALT 14 04/30/2015 0123   ALKPHOS 170* 04/30/2015  0123   BILITOT 0.7 04/30/2015 0123   GFRNONAA 27* 05/01/2015 0531   GFRNONAA >89 02/13/2015 1607   GFRAA 31* 05/01/2015 0531   GFRAA >89 02/13/2015 1607   Lipase     Component Value Date/Time   LIPASE 76* 06/10/2011 1807       Studies/Results: Mr Shoulder Right Wo Contrast  04/30/2015   CLINICAL DATA:  Right shoulder pain. Recent history of antibiotic use. Patient was in too much pain to continue with the contrasted portion of the exam.  EXAM: MRI OF THE RIGHT SHOULDER WITHOUT CONTRAST  TECHNIQUE: Multiplanar, multisequence MR imaging of the shoulder was performed. No intravenous contrast was administered.  COMPARISON:  None.  FINDINGS: Rotator cuff: Mild tendinosis of the supraspinatus and infraspinatus tendon. Teres minor tendon is intact. Subscapularis tendon is intact.  Muscles: There is severe muscle edema in the subscapularis muscle with small 5 x 6 x 25 mm fluid collection. There is severe muscle edema in the anterior and posterior deltoid muscles with a 14 mm fluid collection in the deltoid muscle. There is soft tissue edema superficial to the subscapularis and infraspinatus muscles. There is mild muscle edema along the periphery of the teres minor muscle.  Biceps long head:  Intact.  Acromioclavicular Joint: Mild degenerative change of the acromioclavicular joint. Type II acromion.  Glenohumeral Joint: Large joint  effusion.  No chondral defect.  Labrum:  Intact.  Bones: No focal marrow signal abnormality. No fracture or dislocation.  IMPRESSION: 1. Severe muscle edema in the subscapularis muscle with small 5 x 6 x 25 mm fluid collection. There is severe muscle edema in the anterior and posterior deltoid muscles with a 14 mm fluid collection in the deltoid muscle. Soft tissue edema superficial to the subscapularis and infraspinatus muscles. Mild muscle edema along the periphery of the teres minor muscle. The overall appearance is most concerning for myositis which may be secondary to an  infectious or inflammatory etiology. The small fluid collections make the process more concerning for an infectious etiology. 2. Large glenohumeral joint effusion. Septic arthritis cannot be excluded.   Electronically Signed   By: Elige Ko   On: 04/30/2015 10:45    Anti-infectives: Anti-infectives    Start     Dose/Rate Route Frequency Ordered Stop   05/01/15 0200  cefTRIAXone (ROCEPHIN) 2 g in dextrose 5 % 50 mL IVPB - Premix  Status:  Discontinued     2 g 100 mL/hr over 30 Minutes Intravenous Every 24 hours 04/30/15 0427 04/30/15 0808   04/30/15 1900  ceFAZolin (ANCEF) 3 g in dextrose 5 % 50 mL IVPB     3 g 160 mL/hr over 30 Minutes Intravenous  Once 04/30/15 1854 04/30/15 1946   04/30/15 1200  vancomycin (VANCOCIN) 1,500 mg in sodium chloride 0.9 % 500 mL IVPB     1,500 mg 250 mL/hr over 120 Minutes Intravenous Every 8 hours 04/30/15 0436     04/30/15 0830  piperacillin-tazobactam (ZOSYN) IVPB 3.375 g     3.375 g 12.5 mL/hr over 240 Minutes Intravenous 3 times per day 04/30/15 0824     04/30/15 0445  vancomycin (VANCOCIN) 1,500 mg in sodium chloride 0.9 % 500 mL IVPB     1,500 mg 250 mL/hr over 120 Minutes Intravenous  Once 04/30/15 0436 04/30/15 0707   04/30/15 0315  cefTRIAXone (ROCEPHIN) 2 g in dextrose 5 % 50 mL IVPB     2 g 100 mL/hr over 30 Minutes Intravenous  Once 04/30/15 0312 04/30/15 0433   04/30/15 0115  vancomycin (VANCOCIN) IVPB 1000 mg/200 mL premix     1,000 mg 200 mL/hr over 60 Minutes Intravenous  Once 04/30/15 0111 04/30/15 0302       Assessment/Plan Right chest wall subcutaneous abscess -Dressing changes BID WD with 1/2 inch iodoform gauze packing strips -Antibiotics - on zosyn/vancomycin Day #2, WBC is 18.4, await cultures -Tolerating carb mod diet Right shoulder abscess - per Dr. Eulah Pont  AKI - per primary service Morbid obesity - BMI 53.53 Uncontrolled DM    LOS: 1 day    DORT, Desiray Orchard 05/01/2015, 11:24 AM Pager: [phone]

## 2015-05-01 NOTE — Progress Notes (Signed)
[facility]                                                                              Patient Demographics  Lindsay [name], is a 34 y.o. female, DOB - Nov 04, 1981, ZOX:[id number]  Admit date - 04/30/2015   Admitting Physician Houston Siren, MD  Outpatient Primary MD for the patient is Doris Cheadle, MD  LOS - 1   Chief Complaint  Patient presents with  . Shoulder Pain       Brief HPI   The patient is a 34 year old female with obesity, asthma, diabetes, hypertension presented to ED with right shoulder worsening pain. Patient was recently seen in the ED and had a small I&D done in ER and was placed on doxycycline. Patient however continued to have increased pain but no subjective fevers or chills. Evaluation in ED included a marked leukocytosis with WBC of 18 K and elevated blood sugar in 400s. Patient was started on IV vancomycin and hospitalist was asked to admit for cellulitis and hyperglycemia.   Assessment & Plan    Principal Problem:   Cellulitis of right shoulder and chest wall with abscess - MRI of the right shoulder which showed severe muscle edema with small 5 x 6x 25 mm fluid collection overall appearance concerning for myositis due to infectious or inflammatory etiology, large glenohumeral joint effusion, possible septic arthritis - Called orthopedics, appreciate Dr. Eulah Pont for seeing the patient, patient underwent irrigation and debridement of the right shoulder with arthroscopy and patient was also found to have right chest wall subcutaneous abscess and Dr. Derrell Lolling was consulted intraoperatively. Patient underwent I&D of the right chest wall abscess as well. - Continue IV antibiotics and follow cultures - Pain control   Active Problems:   Diabetes mellitus due to underlying condition : Uncontrolled - Blood sugars improving after starting on Lantus, placed on meal coverage NovoLog and sliding scale insulin, follow hemoglobin A1c    Obesity - Patient  counseled on diet and weight control    HTN (hypertension) -Currently BP borderline soft, not on any antihypertensive    Hyponatremia: Improving, also some component of pseudohyponatremia due to hyperglycemia - Corrected sodium 130  Acute kidney injury - Continue IV fluid hydration, vancomycin needs to be renally dosed -  Recheck BMET in a.m.   Code Status: Full code   Family Communication: Discussed in detail with the patient, all imaging results, lab results explained to the patient   Disposition Plan: Not medically ready   Time Spent in minutes   25 minutes  Procedures  Irrigation and debridement of the right shoulder   I&D of the chest wall  Consults   Orthopedics, Dr. Eulah Pont   general surgery, Dr. Derrell Lolling  DVT Prophylaxis heparin subcutaneous   Medications  Scheduled Meds: . aspirin  81 mg Oral Daily  . docusate sodium  100 mg Oral BID  . gabapentin  300 mg Oral TID  . heparin  5,000 Units Subcutaneous 3 times per day  . insulin aspart  0-15 Units Subcutaneous TID WC  . insulin aspart  0-5 Units Subcutaneous QHS  . insulin aspart  10 Units Subcutaneous TID  WC  . insulin glargine  60 Units Subcutaneous Daily  . piperacillin-tazobactam (ZOSYN)  IV  3.375 g Intravenous 3 times per day  . polyethylene glycol  17 g Oral Daily  . vancomycin  1,500 mg Intravenous Q8H  . [START ON 05/06/2015] Vitamin D (Ergocalciferol)  50,000 Units Oral Q7 days   Continuous Infusions: . sodium chloride 100 mL/hr at 04/30/15 0803  . lactated ringers 20 mL/hr at 04/30/15 1631   PRN Meds:.acetaminophen, HYDROmorphone (DILAUDID) injection, metoCLOPramide **OR** metoCLOPramide (REGLAN) injection, ondansetron **OR** ondansetron (ZOFRAN) IV, oxyCODONE-acetaminophen   Antibiotics   Anti-infectives    Start     Dose/Rate Route Frequency Ordered Stop   05/01/15 0200  cefTRIAXone (ROCEPHIN) 2 g in dextrose 5 % 50 mL IVPB - Premix  Status:  Discontinued     2 g 100 mL/hr over 30 Minutes  Intravenous Every 24 hours 04/30/15 0427 04/30/15 0808   04/30/15 1900  ceFAZolin (ANCEF) 3 g in dextrose 5 % 50 mL IVPB     3 g 160 mL/hr over 30 Minutes Intravenous  Once 04/30/15 1854 04/30/15 1946   04/30/15 1200  vancomycin (VANCOCIN) 1,500 mg in sodium chloride 0.9 % 500 mL IVPB     1,500 mg 250 mL/hr over 120 Minutes Intravenous Every 8 hours 04/30/15 0436     04/30/15 0830  piperacillin-tazobactam (ZOSYN) IVPB 3.375 g     3.375 g 12.5 mL/hr over 240 Minutes Intravenous 3 times per day 04/30/15 0824     04/30/15 0445  vancomycin (VANCOCIN) 1,500 mg in sodium chloride 0.9 % 500 mL IVPB     1,500 mg 250 mL/hr over 120 Minutes Intravenous  Once 04/30/15 0436 04/30/15 0707   04/30/15 0315  cefTRIAXone (ROCEPHIN) 2 g in dextrose 5 % 50 mL IVPB     2 g 100 mL/hr over 30 Minutes Intravenous  Once 04/30/15 0312 04/30/15 0433   04/30/15 0115  vancomycin (VANCOCIN) IVPB 1000 mg/200 mL premix     1,000 mg 200 mL/hr over 60 Minutes Intravenous  Once 04/30/15 0111 04/30/15 0302        Subjective:   Lindsay [name] was seen and examined today. Feels better today, has constipation otherwise pain is controlled. Patient denies dizziness, chest pain, shortness of breath, abdominal pain, N/V/D/C, new weakness, numbess, tingling. No acute events overnight.    Objective:   Blood pressure 103/46, pulse 110, temperature 98.3 F (36.8 C), temperature source Oral, resp. rate 18, height 5\' 8"  (1.727 m), weight 159.666 kg (352 lb), last menstrual period 04/23/2015, SpO2 91 %.  Wt Readings from Last 3 Encounters:  04/30/15 159.666 kg (352 lb)  04/03/15 156.945 kg (346 lb)  02/13/15 154.949 kg (341 lb 9.6 oz)     Intake/Output Summary (Last 24 hours) at 05/01/15 1116 Last data filed at 05/01/15 0900  Gross per 24 hour  Intake   1430 ml  Output      0 ml  Net   1430 ml    Exam  General: Alert and oriented x 3, NAD  HEENT:  PERRLA, EOMI, Anicteic Sclera, mucous membranes moist.   Neck:  Supple, no JVD, no masses  CVS: S1 S2 auscultated, RRR  Respiratory: Clear to auscultation bilaterally  Abdomen: Soft, nontender, nondistended, + bowel sounds  Ext: no cyanosis clubbing or edema, dressing intact on the right chest and shoulder   Neuro: AAOx3, Cr N's II- XII. Strength 5/5 upper and lower extremities bilaterally  Skin:  dressing intact on the right chest and  shoulder  Psych: Normal affect and demeanor, alert and oriented x3    Data Review   Micro Results Recent Results (from the past 240 hour(s))  Culture, routine-abscess     Status: None (Preliminary result)   Collection Time: 04/27/15  6:36 AM  Result Value Ref Range Status   Specimen Description ABSCESS RIGHT SHOULDER  Final   Special Requests NONE  Final   Gram Stain   Final    FEW WBC PRESENT,BOTH PMN AND MONONUCLEAR RARE GRAM NEGATIVE RODS Performed at [facility]    Culture   Final    NO GROWTH 3 DAYS Note: CONTINUING TO HOLD FOR 14 DAYS Performed at [facility]    Report Status PENDING  Incomplete  Surgical pcr screen     Status: Abnormal   Collection Time: 04/30/15  3:23 PM  Result Value Ref Range Status   MRSA, PCR NEGATIVE NEGATIVE Final   Staphylococcus aureus POSITIVE (A) NEGATIVE Final    Comment:        The Xpert SA Assay (FDA approved for NASAL specimens in patients over 35 years of age), is one component of a comprehensive surveillance program.  Test performance has been validated by [facility] for patients greater than or equal to 36 year old. It is not intended to diagnose infection nor to guide or monitor treatment.   Anaerobic culture     Status: None (Preliminary result)   Collection Time: 04/30/15  7:41 PM  Result Value Ref Range Status   Specimen Description WOUND RIGHT SHOULDER  Final   Special Requests RIGHT SHOULDER  Final   Gram Stain PENDING  Incomplete   Culture   Final    No Anaerobes Isolated; Culture in Progress for 14 DAYS Performed at  [facility]    Report Status PENDING  Incomplete  Culture, routine-abscess     Status: None (Preliminary result)   Collection Time: 04/30/15  7:41 PM  Result Value Ref Range Status   Specimen Description ABSCESS RIGHT SHOULDER  Final   Special Requests PATIENT ON FOLLOWING VANCOMYCIN ZOSYN  Final   Gram Stain PENDING  Incomplete   Culture PENDING  Incomplete   Report Status PENDING  Incomplete  Anaerobic culture     Status: None (Preliminary result)   Collection Time: 04/30/15  7:47 PM  Result Value Ref Range Status   Specimen Description ABSCESS  DRAINAGE FROM RIGHT SHOULDER  Final   Special Requests PATIENT ON FOLLOWING VANCOMYCIN ZOSYN B  Final   Gram Stain PENDING  Incomplete   Culture   Final    NO ANAEROBES ISOLATED; CULTURE IN PROGRESS FOR 5 DAYS Performed at [facility]    Report Status PENDING  Incomplete  Culture, routine-abscess     Status: None (Preliminary result)   Collection Time: 04/30/15  7:47 PM  Result Value Ref Range Status   Specimen Description ABSCESS RIGHT SHOULDER DRAINAGE  Final   Special Requests   Final    PATIENT ON FOLLOWING VANCOMYCIN ZOSYN SPECIMEN B 2 CUPS   Gram Stain PENDING  Incomplete   Culture PENDING  Incomplete   Report Status PENDING  Incomplete  Anaerobic culture     Status: None (Preliminary result)   Collection Time: 04/30/15  7:48 PM  Result Value Ref Range Status   Specimen Description FLUID RIGHT SHOULDER JOINT  Final   Special Requests PATIENT ON FOLLOWING VANCOMYCIN ZOSYN C  Final   Gram Stain PENDING  Incomplete   Culture  Final    No Anaerobes Isolated; Culture in Progress for 14 DAYS Performed at [facility]    Report Status PENDING  Incomplete  Body fluid culture     Status: None (Preliminary result)   Collection Time: 04/30/15  7:48 PM  Result Value Ref Range Status   Specimen Description FLUID RIGHT SHOULDER JOINT  Final   Special Requests PATIENT ON FOLLOWING VANC,ZOSYN  Final   Gram  Stain   Final    RARE WBC PRESENT, PREDOMINANTLY PMN NO ORGANISMS SEEN Performed at [facility]    Culture PENDING  Incomplete   Report Status PENDING  Incomplete    Radiology Reports Dg Shoulder Right  04/19/2015   CLINICAL DATA:  Acute onset of right shoulder pain. Heard right shoulder pop 2 days ago. Initial encounter.  EXAM: RIGHT SHOULDER - 2+ VIEW  COMPARISON:  None.  FINDINGS: There is no evidence of fracture or dislocation. The right humeral head is seated within the glenoid fossa. The acromioclavicular joint is unremarkable in appearance. No significant soft tissue abnormalities are seen. The visualized portions of the right lung are clear.  IMPRESSION: No evidence of fracture or dislocation.   Electronically Signed   By: Roanna Raider M.D.   On: 04/19/2015 01:26   Ct Shoulder Right Wo Contrast  04/27/2015   CLINICAL DATA:  Constant right shoulder pain for 10 days  EXAM: CT OF THE RIGHT SHOULDER WITHOUT CONTRAST  TECHNIQUE: Multidetector CT imaging was performed according to the standard protocol. Multiplanar CT image reconstructions were also generated.  COMPARISON:  Radiographs 04/19/2015  FINDINGS: There is no fracture or dislocation. There is no bone lesion. There is mild osteolysis of the distal clavicle. This can be seen after trauma or with inflammatory arthropathies. It can be idiopathic. No soft tissue mass is evident. There is no abnormal fluid collection. Visible portions of the right ribs and chest wall appear unremarkable.  IMPRESSION: Osteolysis of the distal right clavicle, uncertain chronicity.   Electronically Signed   By: Ellery Plunk M.D.   On: 04/27/2015 03:43   Mr Shoulder Right Wo Contrast  04/30/2015   CLINICAL DATA:  Right shoulder pain. Recent history of antibiotic use. Patient was in too much pain to continue with the contrasted portion of the exam.  EXAM: MRI OF THE RIGHT SHOULDER WITHOUT CONTRAST  TECHNIQUE: Multiplanar, multisequence MR imaging of  the shoulder was performed. No intravenous contrast was administered.  COMPARISON:  None.  FINDINGS: Rotator cuff: Mild tendinosis of the supraspinatus and infraspinatus tendon. Teres minor tendon is intact. Subscapularis tendon is intact.  Muscles: There is severe muscle edema in the subscapularis muscle with small 5 x 6 x 25 mm fluid collection. There is severe muscle edema in the anterior and posterior deltoid muscles with a 14 mm fluid collection in the deltoid muscle. There is soft tissue edema superficial to the subscapularis and infraspinatus muscles. There is mild muscle edema along the periphery of the teres minor muscle.  Biceps long head:  Intact.  Acromioclavicular Joint: Mild degenerative change of the acromioclavicular joint. Type II acromion.  Glenohumeral Joint: Large joint effusion.  No chondral defect.  Labrum:  Intact.  Bones: No focal marrow signal abnormality. No fracture or dislocation.  IMPRESSION: 1. Severe muscle edema in the subscapularis muscle with small 5 x 6 x 25 mm fluid collection. There is severe muscle edema in the anterior and posterior deltoid muscles with a 14 mm fluid collection in the deltoid muscle. Soft tissue  edema superficial to the subscapularis and infraspinatus muscles. Mild muscle edema along the periphery of the teres minor muscle. The overall appearance is most concerning for myositis which may be secondary to an infectious or inflammatory etiology. The small fluid collections make the process more concerning for an infectious etiology. 2. Large glenohumeral joint effusion. Septic arthritis cannot be excluded.   Electronically Signed   By: Elige Ko   On: 04/30/2015 10:45    CBC  Recent Labs Lab 04/27/15 0244 04/30/15 0123 04/30/15 0627 05/01/15 0531  WBC 13.6* 18.2* 17.1* 18.4*  HGB 11.5* 10.7* 10.2* 9.7*  HCT 34.2* 32.5* 31.3* 30.5*  PLT 377 353 343 370  MCV 74.0* 75.1* 74.7* 76.3*  MCH 24.9* 24.7* 24.3* 24.3*  MCHC 33.6 32.9 32.6 31.8  RDW 13.9  14.1 14.2 14.6  LYMPHSABS 1.9 0.0*  --   --   MONOABS 1.1* 0.0*  --   --   EOSABS 0.1 0.0  --   --   BASOSABS 0.0 0.0  --   --     Chemistries   Recent Labs Lab 04/27/15 0244 04/30/15 0123 04/30/15 0627 05/01/15 0531  NA 126* 121* 123* 127*  K 4.8 4.2 4.0 4.2  CL 92* 86* 88* 91*  CO2 21* 23 23 22   GLUCOSE 432* 438* 414* 291*  BUN 9 18 19  28*  CREATININE 0.76 1.05* 0.89 2.28*  CALCIUM 8.9 8.3* 8.3* 7.9*  AST  --  19  --   --   ALT  --  14  --   --   ALKPHOS  --  170*  --   --   BILITOT  --  0.7  --   --    ------------------------------------------------------------------------------------------------------------------ estimated creatinine clearance is 56.6 mL/min (by C-G formula based on Cr of 2.28). ------------------------------------------------------------------------------------------------------------------  Recent Labs  04/30/15 0755  HGBA1C 12.7*   ------------------------------------------------------------------------------------------------------------------ No results for input(s): CHOL, HDL, LDLCALC, TRIG, CHOLHDL, LDLDIRECT in the last 72 hours. ------------------------------------------------------------------------------------------------------------------ No results for input(s): TSH, T4TOTAL, T3FREE, THYROIDAB in the last 72 hours.  Invalid input(s): FREET3 ------------------------------------------------------------------------------------------------------------------ No results for input(s): VITAMINB12, FOLATE, FERRITIN, TIBC, IRON, RETICCTPCT in the last 72 hours.  Coagulation profile No results for input(s): INR, PROTIME in the last 168 hours.  No results for input(s): DDIMER in the last 72 hours.  Cardiac Enzymes No results for input(s): CKMB, TROPONINI, MYOGLOBIN in the last 168 hours.  Invalid input(s): CK ------------------------------------------------------------------------------------------------------------------ Invalid  input(s): POCBNP   Recent Labs  04/30/15 1811 04/30/15 2029 04/30/15 2203 05/01/15 0007 05/01/15 0345 05/01/15 0901  GLUCAP 240* 229* 282* 282* 304* 301*     RAI,RIPUDEEP M.D. [facility] 05/01/2015, 11:16 AM  Pager: [phone]   Between 7am to 7pm - call Pager - [phone]  After 7pm go to [email or web address] - password [email or web address]  Call night coverage person covering after 7pm

## 2015-05-02 ENCOUNTER — Encounter (HOSPITAL_COMMUNITY): Payer: Self-pay | Admitting: General Practice

## 2015-05-02 ENCOUNTER — Inpatient Hospital Stay (HOSPITAL_COMMUNITY): Payer: No Typology Code available for payment source

## 2015-05-02 DIAGNOSIS — M00011 Staphylococcal arthritis, right shoulder: Secondary | ICD-10-CM

## 2015-05-02 DIAGNOSIS — M609 Myositis, unspecified: Secondary | ICD-10-CM

## 2015-05-02 DIAGNOSIS — E119 Type 2 diabetes mellitus without complications: Secondary | ICD-10-CM

## 2015-05-02 DIAGNOSIS — N179 Acute kidney failure, unspecified: Secondary | ICD-10-CM

## 2015-05-02 DIAGNOSIS — Z794 Long term (current) use of insulin: Secondary | ICD-10-CM

## 2015-05-02 DIAGNOSIS — B958 Unspecified staphylococcus as the cause of diseases classified elsewhere: Secondary | ICD-10-CM

## 2015-05-02 DIAGNOSIS — Z6841 Body Mass Index (BMI) 40.0 and over, adult: Secondary | ICD-10-CM

## 2015-05-02 DIAGNOSIS — J45909 Unspecified asthma, uncomplicated: Secondary | ICD-10-CM

## 2015-05-02 DIAGNOSIS — B9689 Other specified bacterial agents as the cause of diseases classified elsewhere: Secondary | ICD-10-CM

## 2015-05-02 LAB — GLUCOSE, CAPILLARY
GLUCOSE-CAPILLARY: 224 mg/dL — AB (ref 65–99)
GLUCOSE-CAPILLARY: 264 mg/dL — AB (ref 65–99)
GLUCOSE-CAPILLARY: 156 mg/dL — AB (ref 65–99)
Glucose-Capillary: 189 mg/dL — ABNORMAL HIGH (ref 65–99)
Glucose-Capillary: 221 mg/dL — ABNORMAL HIGH (ref 65–99)

## 2015-05-02 LAB — URINALYSIS, ROUTINE W REFLEX MICROSCOPIC
Glucose, UA: >1000 mg/dL — AB
Glucose, UA: 100 mg/dL — AB
Ketones, ur: 15 mg/dL — AB
Ketones, ur: 15 mg/dL — AB
NITRITE: NEGATIVE
NITRITE: NEGATIVE
PH: 5 (ref 5.0–8.0)
Protein, ur: 30 mg/dL — AB
Protein, ur: 30 mg/dL — AB
SPECIFIC GRAVITY, URINE: 1.026 (ref 1.005–1.030)
SPECIFIC GRAVITY, URINE: 1.028 (ref 1.005–1.030)
UROBILINOGEN UA: 0.2 mg/dL (ref 0.0–1.0)
Urobilinogen, UA: 1 mg/dL (ref 0.0–1.0)
pH: 5 (ref 5.0–8.0)

## 2015-05-02 LAB — URINE MICROSCOPIC-ADD ON

## 2015-05-02 LAB — CBC
HCT: 29.3 % — ABNORMAL LOW (ref 36.0–46.0)
HEMATOCRIT: 28.6 % — AB (ref 36.0–46.0)
HEMOGLOBIN: 9 g/dL — AB (ref 12.0–15.0)
Hemoglobin: 9.5 g/dL — ABNORMAL LOW (ref 12.0–15.0)
MCH: 24.6 pg — ABNORMAL LOW (ref 26.0–34.0)
MCH: 23.9 pg — ABNORMAL LOW (ref 26.0–34.0)
MCHC: 32.4 g/dL (ref 30.0–36.0)
MCHC: 31.5 g/dL (ref 30.0–36.0)
MCV: 75.9 fL — ABNORMAL LOW (ref 78.0–100.0)
MCV: 76.1 fL — ABNORMAL LOW (ref 78.0–100.0)
PLATELETS: 387 10*3/uL (ref 150–400)
Platelets: 412 10*3/uL — ABNORMAL HIGH (ref 150–400)
RBC: 3.86 MIL/uL — ABNORMAL LOW (ref 3.87–5.11)
RBC: 3.76 MIL/uL — AB (ref 3.87–5.11)
RDW: 15.3 % (ref 11.5–15.5)
RDW: 15 % (ref 11.5–15.5)
WBC: 19.5 10*3/uL — AB (ref 4.0–10.5)
WBC: 15.6 10*3/uL — ABNORMAL HIGH (ref 4.0–10.5)

## 2015-05-02 LAB — SODIUM, URINE, RANDOM: Sodium, Ur: 12 mmol/L

## 2015-05-02 LAB — BASIC METABOLIC PANEL
Anion gap: 17 — ABNORMAL HIGH (ref 5–15)
Anion gap: 15 (ref 5–15)
BUN: 43 mg/dL — ABNORMAL HIGH (ref 6–20)
BUN: 48 mg/dL — AB (ref 6–20)
CALCIUM: 7.5 mg/dL — AB (ref 8.9–10.3)
CHLORIDE: 88 mmol/L — AB (ref 101–111)
CO2: 19 mmol/L — ABNORMAL LOW (ref 22–32)
CO2: 21 mmol/L — ABNORMAL LOW (ref 22–32)
CREATININE: 4.3 mg/dL — AB (ref 0.44–1.00)
Calcium: 7.5 mg/dL — ABNORMAL LOW (ref 8.9–10.3)
Chloride: 90 mmol/L — ABNORMAL LOW (ref 101–111)
Creatinine, Ser: 4.8 mg/dL — ABNORMAL HIGH (ref 0.44–1.00)
GFR calc Af Amer: 15 mL/min — ABNORMAL LOW (ref >60)
GFR calc non Af Amer: 13 mL/min — ABNORMAL LOW (ref >60)
GFR calc non Af Amer: 11 mL/min — ABNORMAL LOW (ref >60)
GFR, EST AFRICAN AMERICAN: 13 mL/min — AB (ref >60)
GLUCOSE: 209 mg/dL — AB (ref 65–99)
Glucose, Bld: 224 mg/dL — ABNORMAL HIGH (ref 65–99)
POTASSIUM: 4.2 mmol/L (ref 3.5–5.1)
Potassium: 4.9 mmol/L (ref 3.5–5.1)
SODIUM: 124 mmol/L — AB (ref 135–145)
Sodium: 126 mmol/L — ABNORMAL LOW (ref 135–145)

## 2015-05-02 LAB — CREATININE, URINE, RANDOM: CREATININE, URINE: 215.39 mg/dL

## 2015-05-02 LAB — VANCOMYCIN, RANDOM: Vancomycin Rm: 18 ug/mL

## 2015-05-02 LAB — CK: Total CK: 2196 U/L — ABNORMAL HIGH (ref 38–234)

## 2015-05-02 MED ORDER — INSULIN ASPART 100 UNIT/ML ~~LOC~~ SOLN
0.0000 [IU] | Freq: Three times a day (TID) | SUBCUTANEOUS | Status: DC
  Administered 2015-05-02: 11 [IU] via SUBCUTANEOUS
  Administered 2015-05-03: 3 [IU] via SUBCUTANEOUS
  Administered 2015-05-03 – 2015-05-04 (×2): 4 [IU] via SUBCUTANEOUS
  Administered 2015-05-04: 3 [IU] via SUBCUTANEOUS
  Administered 2015-05-05 – 2015-05-07 (×6): 4 [IU] via SUBCUTANEOUS
  Administered 2015-05-08: 7 [IU] via SUBCUTANEOUS
  Administered 2015-05-08: 4 [IU] via SUBCUTANEOUS
  Administered 2015-05-08: 7 [IU] via SUBCUTANEOUS
  Administered 2015-05-09 (×3): 4 [IU] via SUBCUTANEOUS
  Administered 2015-05-10: 3 [IU] via SUBCUTANEOUS
  Administered 2015-05-10 (×2): 4 [IU] via SUBCUTANEOUS
  Administered 2015-05-12 (×2): 3 [IU] via SUBCUTANEOUS

## 2015-05-02 MED ORDER — INSULIN ASPART 100 UNIT/ML ~~LOC~~ SOLN
15.0000 [IU] | Freq: Three times a day (TID) | SUBCUTANEOUS | Status: DC
  Administered 2015-05-02 – 2015-05-04 (×6): 15 [IU] via SUBCUTANEOUS

## 2015-05-02 MED ORDER — BISACODYL 10 MG RE SUPP
10.0000 mg | Freq: Once | RECTAL | Status: DC
  Filled 2015-05-02: qty 1

## 2015-05-02 MED ORDER — SODIUM CHLORIDE 0.9 % IV BOLUS (SEPSIS)
500.0000 mL | Freq: Once | INTRAVENOUS | Status: AC
  Administered 2015-05-02: 500 mL via INTRAVENOUS

## 2015-05-02 MED ORDER — INSULIN GLARGINE 100 UNIT/ML ~~LOC~~ SOLN
70.0000 [IU] | Freq: Every day | SUBCUTANEOUS | Status: DC
  Administered 2015-05-03: 70 [IU] via SUBCUTANEOUS
  Filled 2015-05-02 (×2): qty 0.7

## 2015-05-02 MED ORDER — INSULIN GLARGINE 100 UNIT/ML ~~LOC~~ SOLN
10.0000 [IU] | Freq: Once | SUBCUTANEOUS | Status: AC
  Administered 2015-05-02: 10 [IU] via SUBCUTANEOUS
  Filled 2015-05-02 (×2): qty 0.1

## 2015-05-02 MED ORDER — SODIUM CHLORIDE 0.9 % IV SOLN
1000.0000 mg | INTRAVENOUS | Status: DC
  Filled 2015-05-02: qty 20

## 2015-05-02 MED ORDER — INSULIN GLARGINE 100 UNIT/ML ~~LOC~~ SOLN: 65.0000 [IU] | Freq: Every day | SUBCUTANEOUS | Status: DC

## 2015-05-02 MED ORDER — SODIUM CHLORIDE 0.9 % IV SOLN
INTRAVENOUS | Status: DC
Start: 2015-05-02 — End: 2015-05-02

## 2015-05-02 MED ORDER — STERILE WATER FOR INJECTION IV SOLN
INTRAVENOUS | Status: DC
  Administered 2015-05-02 – 2015-05-04 (×5): via INTRAVENOUS
  Filled 2015-05-02 (×8): qty 850

## 2015-05-02 MED ORDER — SODIUM CHLORIDE 0.9 % IV BOLUS (SEPSIS)
1000.0000 mL | Freq: Once | INTRAVENOUS | Status: AC
  Administered 2015-05-02: 1000 mL via INTRAVENOUS

## 2015-05-02 NOTE — Progress Notes (Signed)
Patient stated she voided once earlier during the day. Receiving IVF NS@125 . Multiple attempts made to the bathroom to void without success. Bladder scan showed 31ml. Been >12 hours since patient last voided, In and out cath done got out of concentrated urine. U/A sent down per protocol. Low bp 76/56, 104, 22, 98.4, 97%2L Quentin. MD on call notified orders given. Will continue to monitor patient.

## 2015-05-02 NOTE — Progress Notes (Signed)
ANTIBIOTIC CONSULT NOTE - Follow-up  Pharmacy Consult for Daptomycin and Zosyn Indication: shoulder infection  No Known Allergies  Patient Measurements: Height: 5\' 8"  (172.7 cm) Weight: (!) 352 lb (159.666 kg) IBW/kg (Calculated) : 63.9  Vital Signs: Temp: 98.3 F (36.8 C) (05/12 1300) BP: 84/65 mmHg (05/12 1300) Pulse Rate: 107 (05/12 1300)  Labs:  Recent Labs  04/30/15 0627 05/01/15 0531 05/02/15 0517  WBC 17.1* 18.4* 19.5*  HGB 10.2* 9.7* 9.5*  PLT 343 370 387  CREATININE 0.89 2.28* 4.30*   Estimated Creatinine Clearance: 30 mL/min (by C-G formula based on Cr of 4.3).   Microbiology: Recent Results (from the past 720 hour(s))  Culture, routine-abscess     Status: None (Preliminary result)   Collection Time: 04/27/15  6:36 AM  Result Value Ref Range Status   Specimen Description ABSCESS RIGHT SHOULDER  Final   Special Requests NONE  Final   Gram Stain   Final    FEW WBC PRESENT,BOTH PMN AND MONONUCLEAR RARE GRAM NEGATIVE RODS Performed at [facility]    Culture   Final    NO GROWTH 3 DAYS Note: CONTINUING TO HOLD FOR 14 DAYS Performed at [facility]    Report Status PENDING  Incomplete  Surgical pcr screen     Status: Abnormal   Collection Time: 04/30/15  3:23 PM  Result Value Ref Range Status   MRSA, PCR NEGATIVE NEGATIVE Final   Staphylococcus aureus POSITIVE (A) NEGATIVE Final    Comment:        The Xpert SA Assay (FDA approved for NASAL specimens in patients over 58 years of age), is one component of a comprehensive surveillance program.  Test performance has been validated by [facility] for patients greater than or equal to 53 year old. It is not intended to diagnose infection nor to guide or monitor treatment.   Anaerobic culture     Status: None (Preliminary result)   Collection Time: 04/30/15  7:41 PM  Result Value Ref Range Status   Specimen Description WOUND RIGHT SHOULDER  Final   Special Requests RIGHT SHOULDER   Final   Gram Stain PENDING  Incomplete   Culture   Final    No Anaerobes Isolated; Culture in Progress for 14 DAYS Performed at [facility]    Report Status PENDING  Incomplete  Culture, routine-abscess     Status: None (Preliminary result)   Collection Time: 04/30/15  7:41 PM  Result Value Ref Range Status   Specimen Description ABSCESS RIGHT SHOULDER  Final   Special Requests PATIENT ON FOLLOWING VANCOMYCIN ZOSYN  Final   Gram Stain   Final    NO WBC SEEN NO SQUAMOUS EPITHELIAL CELLS SEEN NO ORGANISMS SEEN Performed at [facility]    Culture   Final    NO GROWTH 1 DAY Performed at [facility]    Report Status PENDING  Incomplete  Anaerobic culture     Status: None (Preliminary result)   Collection Time: 04/30/15  7:47 PM  Result Value Ref Range Status   Specimen Description ABSCESS  DRAINAGE FROM RIGHT SHOULDER  Final   Special Requests PATIENT ON FOLLOWING VANCOMYCIN ZOSYN B  Final   Gram Stain PENDING  Incomplete   Culture   Final    NO ANAEROBES ISOLATED; CULTURE IN PROGRESS FOR 5 DAYS Performed at [facility]    Report Status PENDING  Incomplete  Culture, routine-abscess     Status: None (Preliminary result)  Collection Time: 04/30/15  7:47 PM  Result Value Ref Range Status   Specimen Description ABSCESS RIGHT SHOULDER DRAINAGE  Final   Special Requests   Final    PATIENT ON FOLLOWING VANCOMYCIN ZOSYN SPECIMEN B 2 CUPS   Gram Stain   Final    NO WBC SEEN NO SQUAMOUS EPITHELIAL CELLS SEEN NO ORGANISMS SEEN Performed at [facility]    Culture   Final    NO GROWTH 1 DAY Performed at [facility]    Report Status PENDING  Incomplete  Anaerobic culture     Status: None (Preliminary result)   Collection Time: 04/30/15  7:48 PM  Result Value Ref Range Status   Specimen Description FLUID RIGHT SHOULDER JOINT  Final   Special Requests PATIENT ON FOLLOWING VANCOMYCIN ZOSYN C  Final   Gram Stain PENDING   Incomplete   Culture   Final    No Anaerobes Isolated; Culture in Progress for 14 DAYS Performed at [facility]    Report Status PENDING  Incomplete  Body fluid culture     Status: None (Preliminary result)   Collection Time: 04/30/15  7:48 PM  Result Value Ref Range Status   Specimen Description FLUID RIGHT SHOULDER JOINT  Final   Special Requests PATIENT ON FOLLOWING VANC,ZOSYN  Final   Gram Stain   Final    RARE WBC PRESENT, PREDOMINANTLY PMN NO ORGANISMS SEEN Performed at [facility]    Culture   Final    NO GROWTH 1 DAY Performed at [facility]    Report Status PENDING  Incomplete    Assessment: 33yo female has abscess in shoulder, had I&D two days ago and sent home on doxycycline which she states has been compliant on, has not been able to obtain appt w/ specialist, to begin IV ABX.   Plan:  34 y.o. female continues on Vancomycin and Zosyn (Day #2) for R shoulder cellulitis/wound infection. S/p I&D 5/10. Tm 100.7. WBC up to 19.5. ID has been consulted. SCr with large increase up to 4.3, est CrCl 30 ml/min. Wound cultures pending.  Latest vancomycin random level 18 earlier today.   ID has stopped further vancomycin doses and will change to Daptomycin   Plan: Daptomycin 1g IV q48h (~6mg /kg/dose) Continue Zosyn 3.375gm IV q8h - each dose over 4 hours F/u renal function, micro data, pt's clinical condition Baseline and weekly CK  Celedonio Miyamoto, PharmD, BCPS-AQ ID Clinical Pharmacist Pager [phone]   05/02/2015,3:17 PM

## 2015-05-02 NOTE — Progress Notes (Signed)
Patient ID: Lindsay [name], female   DOB: [birth year], 34 y.o.   MRN: [id number]   LOS: 2 days   Subjective: Shoulder feels better   Objective: Vital signs in last 24 hours: Temp:  [98 F (36.7 C)-98.4 F (36.9 C)] 98 F (36.7 C) (05/12 0507) Pulse Rate:  [104-114] 106 (05/12 0507) Resp:  [16-18] 16 (05/12 0507) BP: (76-111)/(44-74) 93/44 mmHg (05/12 0507) SpO2:  [90 %-100 %] 100 % (05/12 0507) Last BM Date:  (pta per patient 2 weeks ago)   Laboratory  CBC  Recent Labs  05/01/15 0531 05/02/15 0517  WBC 18.4* 19.5*  HGB 9.7* 9.5*  HCT 30.5* 29.3*  PLT 370 387   BMET  Recent Labs  05/01/15 0531 05/02/15 0517  NA 127* 126*  K 4.2 4.9  CL 91* 90*  CO2 22 19*  GLUCOSE 291* 209*  BUN 28* 43*  CREATININE 2.28* 4.30*  CALCIUM 7.9* 7.5*   CBG (last 3)   Recent Labs  05/01/15 1559 05/01/15 2119 05/02/15 0818  GLUCAP 276* 226* 221*    Physical Exam General appearance: alert and no distress Resp: clear to auscultation bilaterally Extremities: No erythema, still significant drainage, minimal odor   Assessment/Plan: Right chest wall subcutaneous abscess -Dressing changes BID WD with 1/2 inch iodoform gauze packing strips -Antibiotics - on zosyn/vancomycin Day #3, WBC is up to 19.5, await cultures -Tolerating carb mod diet Right shoulder abscess - per Dr. Eulah Pont  AKI - per primary service Morbid obesity - BMI 53.53 Uncontrolled DM -- Needs better glucose control, leave to primary team    Freeman Caldron, PA-C Pager: [phone] General Trauma PA Pager: [phone]  05/02/2015

## 2015-05-02 NOTE — Progress Notes (Signed)
     Subjective:  POD#2 ID of R shoulder abscess. Patient reports pain as moderate.  Up to a chair this morning. Reports that the sensation into her hand is much improved.   Objective:   VITALS:   Filed Vitals:   05/01/15 1300 05/01/15 2039 05/02/15 0142 05/02/15 0507  BP: 107/54 111/74 76/56 93/44   Pulse: 114 107 104 106  Temp: 98.2 F (36.8 C) 98.4 F (36.9 C) 98.4 F (36.9 C) 98 F (36.7 C)  TempSrc:  Oral    Resp: 18 18 16 16   Height:      Weight:      SpO2: 90% 91% 97% 100%    Neurologically intact ABD soft Neurovascular intact Sensation intact distally Intact pulses distally Incision: dressing C/D/I R arm in sling  Lab Results  Component Value Date   WBC PENDING 05/02/2015   HGB 9.5* 05/02/2015   HCT 29.3* 05/02/2015   MCV 75.9* 05/02/2015   PLT PENDING 05/02/2015   BMET    Component Value Date/Time   NA 126* 05/02/2015 0517   K 4.9 05/02/2015 0517   CL 90* 05/02/2015 0517   CO2 19* 05/02/2015 0517   GLUCOSE 209* 05/02/2015 0517   BUN 43* 05/02/2015 0517   CREATININE 4.30* 05/02/2015 0517   CREATININE 0.60 02/13/2015 1607   CALCIUM 7.5* 05/02/2015 0517   GFRNONAA 13* 05/02/2015 0517   GFRNONAA >89 02/13/2015 1607   GFRAA 15* 05/02/2015 0517   GFRAA >89 02/13/2015 1607     Assessment/Plan: 2 Days Post-Op   Principal Problem:   Cellulitis of left upper extremity Active Problems:   Diabetes mellitus due to underlying condition without complications   Obesity   HTN (hypertension)   Hyponatremia   Abscess   Abscess of right shoulder   Up with therapy WBAT in the RUE, PT to start working on ROM as tolerated ID consulted for abx management General surgery monitoring wound and packing.   Lindsay [name] Hilda Lias 05/02/2015, 7:25 AM Cell [phone]

## 2015-05-02 NOTE — Consult Note (Signed)
[facility]  Date of Admission:  04/30/2015  Date of Consult:  05/02/2015  Reason for Consult: Septic arthritis Referring Physician: Rai  Impression/Recommendation Septic arthritis, myositis DM2 (since 2004) Acute Renal Failure [place] Obesity (BMI 53.53)  Change anbx to zosyn and cubicin Check HIV Await her cx Wt mgmt Diabetic control Renal seeing pt  Comment- Her prev cx shows GNR. This is very unusual for a de novo infection. The lab is working this up as anaerobe. Await her cx for more info.    Thank you so much for this interesting consult,   Bobby Rumpf (pager) [phone] [email or web address]  Lindsay [name] is an 34 y.o. female.  HPI:  34 yo F who came to ED on 5-9 with R shoulder pain. She had a local I & D and was started on doxy. Her CT showed: Osteolysis of the distal right clavicle, uncertain chronicity. Gram stain GNR.   She returned on 5-10 with worsening pain, and was found to have WBC 18k as well Glc 400. She was started on vancomycin and ceftriaxone. She underwent MRI showing:   1. Severe muscle edema in the subscapularis muscle with small 5 x 6 x 25 mm fluid collection. There is severe muscle edema in the anterior and posterior deltoid muscles with a 14 mm fluid collection in the deltoid muscle. Soft tissue edema superficial to the subscapularis and infraspinatus muscles. Mild muscle edema along the periphery of the teres minor muscle. The overall appearance is most concerning for myositis which may be secondary to an infectious or inflammatory etiology. The small fluid collections make the process more concerning for an infectious etiology. 2. Large glenohumeral joint effusion. Septic arthritis cannot be excluded.  Her anbx were then changed to vanco/zosyn. She was taken to OR on 5-10 where she had chest wall subq  abscess debrided and I & D of her R shoulder.  In hospital she has had issues with hyperglycemia.     Past Medical History  Diagnosis Date  . Hypertension   . Diabetes mellitus   . Asthma   . Obesity     Past Surgical History  Procedure Laterality Date  . Cesarean section    . Tonsillectomy    . Leg surgery    . Irrigation and debridement shoulder Right 04/30/2015    Procedure: IRRIGATION AND DEBRIDEMENT SHOULDER;  Surgeon: Renette Butters, MD;  Location: [facility];  Service: Orthopedics;  Laterality: Right;  . Shoulder arthroscopy Right 04/30/2015    Procedure: ARTHROSCOPY SHOULDER;  Surgeon: Renette Butters, MD;  Location: [facility];  Service: Orthopedics;  Laterality: Right;  . Irrigation and debridement shoulder Right 05/01/2015     No Known Allergies  Medications:  Scheduled: . aspirin  81 mg Oral Daily  . bisacodyl  10 mg Rectal Once  . docusate sodium  100 mg Oral BID  . gabapentin  300 mg Oral TID  . heparin  5,000 Units Subcutaneous 3 times per day  . insulin aspart  0-20 Units Subcutaneous TID WC  . insulin aspart  0-5 Units Subcutaneous QHS  . insulin aspart  15 Units Subcutaneous TID WC  . insulin glargine  10 Units Subcutaneous Once  . [START ON 05/03/2015] insulin glargine  70 Units Subcutaneous Daily  . piperacillin-tazobactam (ZOSYN)  IV  3.375 g Intravenous 3 times per day  . polyethylene glycol  17 g Oral Daily  . [START ON 05/06/2015] Vitamin D (Ergocalciferol)  50,000 Units Oral Q7 days  Abtx:  Anti-infectives    Start     Dose/Rate Route Frequency Ordered Stop   05/01/15 0200  cefTRIAXone (ROCEPHIN) 2 g in dextrose 5 % 50 mL IVPB - Premix  Status:  Discontinued     2 g 100 mL/hr over 30 Minutes Intravenous Every 24 hours 04/30/15 0427 04/30/15 0808   04/30/15 1900  ceFAZolin (ANCEF) 3 g in dextrose 5 % 50 mL IVPB     3 g 160 mL/hr over 30 Minutes Intravenous  Once 04/30/15 1854 04/30/15 1946   04/30/15 1200  vancomycin (VANCOCIN) 1,500 mg in sodium chloride 0.9 % 500 mL IVPB  Status:  Discontinued     1,500 mg 250 mL/hr over 120 Minutes Intravenous  Every 8 hours 04/30/15 0436 05/01/15 1350   04/30/15 0830  piperacillin-tazobactam (ZOSYN) IVPB 3.375 g     3.375 g 12.5 mL/hr over 240 Minutes Intravenous 3 times per day 04/30/15 0824     04/30/15 0445  vancomycin (VANCOCIN) 1,500 mg in sodium chloride 0.9 % 500 mL IVPB     1,500 mg 250 mL/hr over 120 Minutes Intravenous  Once 04/30/15 0436 04/30/15 0707   04/30/15 0315  cefTRIAXone (ROCEPHIN) 2 g in dextrose 5 % 50 mL IVPB     2 g 100 mL/hr over 30 Minutes Intravenous  Once 04/30/15 0312 04/30/15 0433   04/30/15 0115  vancomycin (VANCOCIN) IVPB 1000 mg/200 mL premix     1,000 mg 200 mL/hr over 60 Minutes Intravenous  Once 04/30/15 0111 04/30/15 0302      Total days of antibiotics: 3 vanco/zosyn          Social History:  reports that she has never smoked. She has never used smokeless tobacco. She reports that she drinks alcohol. She reports that she does not use illicit drugs.  Family History  Problem Relation Age of Onset  . Diabetes Mother   . Hypertension Mother   . Heart disease Mother   . Diabetes Father   . Heart disease Father   . Stroke Maternal Grandmother   . Cancer Maternal Grandmother     General ROS: constipated, normal urine, wt steady, FSG eratic at home, ? neuropathy, normal apetite. no f/c. see HPI.   Blood pressure 84/65, pulse 107, temperature 98.3 F (36.8 C), temperature source Oral, resp. rate 16, height _0  (1.727 m), weight 159.666 kg (352 lb), last menstrual period 04/23/2015, SpO2 94 %. General appearance: alert, cooperative and no distress Eyes: negative findings: conjunctivae and sclerae normal and pupils equal, round, reactive to light and accomodation Throat: normal findings: oropharynx pink & moist without lesions or evidence of thrush Neck: no adenopathy and supple, symmetrical, trachea midline Lungs: clear to auscultation bilaterally Heart: regular rate and rhythm Abdomen: normal findings: bowel sounds normal and soft, non-tender and  abnormal findings:  obese Extremities: edema non-pitting Skin: no diabetic foot lesions, R shoulder wrapped.   Light touch grossly normal.    Results for orders placed or performed during the hospital encounter of 04/30/15 (from the past 48 hour(s))  Surgical pcr screen     Status: Abnormal   Collection Time: 04/30/15  3:23 PM  Result Value Ref Range   MRSA, PCR NEGATIVE NEGATIVE   Staphylococcus aureus POSITIVE (A) NEGATIVE    Comment:        The Xpert SA Assay (FDA approved for NASAL specimens in patients over 68 years of age), is one component of a comprehensive surveillance program.  Test performance has been validated by  [facility] for patients greater than or equal to 66 year old. It is not intended to diagnose infection nor to guide or monitor treatment.   Glucose, capillary     Status: Abnormal   Collection Time: 04/30/15  3:54 PM  Result Value Ref Range   Glucose-Capillary 248 (H) 70 - 99 mg/dL  Glucose, capillary     Status: Abnormal   Collection Time: 04/30/15  4:29 PM  Result Value Ref Range   Glucose-Capillary 243 (H) 70 - 99 mg/dL  Glucose, capillary     Status: Abnormal   Collection Time: 04/30/15  6:11 PM  Result Value Ref Range   Glucose-Capillary 240 (H) 70 - 99 mg/dL  Anaerobic culture     Status: None (Preliminary result)   Collection Time: 04/30/15  7:41 PM  Result Value Ref Range   Specimen Description WOUND RIGHT SHOULDER    Special Requests RIGHT SHOULDER    Gram Stain PENDING    Culture      No Anaerobes Isolated; Culture in Progress for 14 DAYS Performed at [facility]    Report Status PENDING   Culture, routine-abscess     Status: None (Preliminary result)   Collection Time: 04/30/15  7:41 PM  Result Value Ref Range   Specimen Description ABSCESS RIGHT SHOULDER    Special Requests PATIENT ON FOLLOWING VANCOMYCIN ZOSYN    Gram Stain      NO WBC SEEN NO SQUAMOUS EPITHELIAL CELLS SEEN NO ORGANISMS SEEN Performed at [facility]    Culture      NO GROWTH 1 DAY Performed at [facility]    Report Status PENDING   Anaerobic culture     Status: None (Preliminary result)   Collection Time: 04/30/15  7:47 PM  Result Value Ref Range   Specimen Description ABSCESS  DRAINAGE FROM RIGHT SHOULDER    Special Requests PATIENT ON FOLLOWING VANCOMYCIN ZOSYN B    Gram Stain PENDING    Culture      NO ANAEROBES ISOLATED; CULTURE IN PROGRESS FOR 5 DAYS Performed at [facility]    Report Status PENDING   Culture, routine-abscess     Status: None (Preliminary result)   Collection Time: 04/30/15  7:47 PM  Result Value Ref Range   Specimen Description ABSCESS RIGHT SHOULDER DRAINAGE    Special Requests      PATIENT ON FOLLOWING VANCOMYCIN ZOSYN SPECIMEN B 2 CUPS   Gram Stain      NO WBC SEEN NO SQUAMOUS EPITHELIAL CELLS SEEN NO ORGANISMS SEEN Performed at [facility]    Culture      NO GROWTH 1 DAY Performed at [facility]    Report Status PENDING   Anaerobic culture     Status: None (Preliminary result)   Collection Time: 04/30/15  7:48 PM  Result Value Ref Range   Specimen Description FLUID RIGHT SHOULDER JOINT    Special Requests PATIENT ON FOLLOWING VANCOMYCIN ZOSYN C    Gram Stain PENDING    Culture      No Anaerobes Isolated; Culture in Progress for 14 DAYS Performed at [facility]    Report Status PENDING   Body fluid culture     Status: None (Preliminary result)   Collection Time: 04/30/15  7:48 PM  Result Value Ref Range   Specimen Description FLUID RIGHT SHOULDER JOINT    Special Requests PATIENT ON FOLLOWING VANC,ZOSYN    Gram Stain      RARE  WBC PRESENT, PREDOMINANTLY PMN NO ORGANISMS SEEN Performed at [facility]    Culture      NO GROWTH 1 DAY Performed at [facility]    Report Status PENDING   Glucose, capillary     Status: Abnormal   Collection Time: 04/30/15  8:29 PM  Result Value Ref Range   Glucose-Capillary  229 (H) 70 - 99 mg/dL  Glucose, capillary     Status: Abnormal   Collection Time: 04/30/15 10:03 PM  Result Value Ref Range   Glucose-Capillary 282 (H) 70 - 99 mg/dL  Glucose, capillary     Status: Abnormal   Collection Time: 05/01/15 12:07 AM  Result Value Ref Range   Glucose-Capillary 282 (H) 70 - 99 mg/dL  Glucose, capillary     Status: Abnormal   Collection Time: 05/01/15  3:45 AM  Result Value Ref Range   Glucose-Capillary 304 (H) 70 - 99 mg/dL  CBC     Status: Abnormal   Collection Time: 05/01/15  5:31 AM  Result Value Ref Range   WBC 18.4 (H) 4.0 - 10.5 K/uL   RBC 4.00 3.87 - 5.11 MIL/uL   Hemoglobin 9.7 (L) 12.0 - 15.0 g/dL   HCT 30.5 (L) 36.0 - 46.0 %   MCV 76.3 (L) 78.0 - 100.0 fL   MCH 24.3 (L) 26.0 - 34.0 pg   MCHC 31.8 30.0 - 36.0 g/dL   RDW 14.6 11.5 - 15.5 %   Platelets 370 150 - 400 K/uL  Basic metabolic panel     Status: Abnormal   Collection Time: 05/01/15  5:31 AM  Result Value Ref Range   Sodium 127 (L) 135 - 145 mmol/L   Potassium 4.2 3.5 - 5.1 mmol/L   Chloride 91 (L) 101 - 111 mmol/L   CO2 22 22 - 32 mmol/L   Glucose, Bld 291 (H) 70 - 99 mg/dL   BUN 28 (H) 6 - 20 mg/dL   Creatinine, Ser 2.28 (H) 0.44 - 1.00 mg/dL    Comment: DELTA CHECK NOTED   Calcium 7.9 (L) 8.9 - 10.3 mg/dL   GFR calc non Af Amer 27 (L) >60 mL/min   GFR calc Af Amer 31 (L) >60 mL/min    Comment: (NOTE) The eGFR has been calculated using the CKD EPI equation. This calculation has not been validated in all clinical situations. eGFR's persistently <60 mL/min signify possible Chronic Kidney Disease.    Anion gap 14 5 - 15  Glucose, capillary     Status: Abnormal   Collection Time: 05/01/15  9:01 AM  Result Value Ref Range   Glucose-Capillary 301 (H) 70 - 99 mg/dL   Comment 1 Notify RN   Glucose, capillary     Status: Abnormal   Collection Time: 05/01/15 11:16 AM  Result Value Ref Range   Glucose-Capillary 359 (H) 70 - 99 mg/dL   Comment 1 Notify RN   Vancomycin, trough      Status: Abnormal   Collection Time: 05/01/15 12:14 PM  Result Value Ref Range   Vancomycin Tr 34 (HH) 10.0 - 20.0 ug/mL    Comment: REPEATED TO VERIFY CRITICAL RESULT CALLED TO, READ BACK BY AND VERIFIED WITH: STOTT,B RN _0  5.11.16 BY GRINSTEAD,C   Glucose, capillary     Status: Abnormal   Collection Time: 05/01/15  3:59 PM  Result Value Ref Range   Glucose-Capillary 276 (H) 70 - 99 mg/dL   Comment 1 Notify RN   Glucose, capillary  Status: Abnormal   Collection Time: 05/01/15  9:19 PM  Result Value Ref Range   Glucose-Capillary 226 (H) 70 - 99 mg/dL  Urinalysis, Routine w reflex microscopic     Status: Abnormal   Collection Time: 05/02/15  1:30 AM  Result Value Ref Range   Color, Urine YELLOW YELLOW   APPearance TURBID (A) CLEAR   Specific Gravity, Urine 1.026 1.005 - 1.030   pH 5.0 5.0 - 8.0   Glucose, UA >1000 (A) NEGATIVE mg/dL   Hgb urine dipstick TRACE (A) NEGATIVE   Bilirubin Urine SMALL (A) NEGATIVE   Ketones, ur 15 (A) NEGATIVE mg/dL   Protein, ur 30 (A) NEGATIVE mg/dL   Urobilinogen, UA 0.2 0.0 - 1.0 mg/dL   Nitrite NEGATIVE NEGATIVE   Leukocytes, UA SMALL (A) NEGATIVE  Urine microscopic-add on     Status: Abnormal   Collection Time: 05/02/15  1:30 AM  Result Value Ref Range   Squamous Epithelial / LPF RARE RARE   WBC, UA 3-6 <3 WBC/hpf   RBC / HPF 0-2 <3 RBC/hpf   Bacteria, UA FEW (A) RARE   Urine-Other AMORPHOUS URATES/PHOSPHATES   CBC     Status: Abnormal   Collection Time: 05/02/15  5:17 AM  Result Value Ref Range   WBC 19.5 (H) 4.0 - 10.5 K/uL    Comment: WHITE COUNT CONFIRMED ON SMEAR CONSISTENT WITH PREVIOUS RESULT    RBC 3.86 (L) 3.87 - 5.11 MIL/uL   Hemoglobin 9.5 (L) 12.0 - 15.0 g/dL   HCT 29.3 (L) 36.0 - 46.0 %   MCV 75.9 (L) 78.0 - 100.0 fL   MCH 24.6 (L) 26.0 - 34.0 pg   MCHC 32.4 30.0 - 36.0 g/dL   RDW 15.3 11.5 - 15.5 %   Platelets 387 150 - 400 K/uL    Comment: PLATELET COUNT CONFIRMED BY SMEAR CONSISTENT WITH PREVIOUS RESULT     Basic metabolic panel     Status: Abnormal   Collection Time: 05/02/15  5:17 AM  Result Value Ref Range   Sodium 126 (L) 135 - 145 mmol/L   Potassium 4.9 3.5 - 5.1 mmol/L   Chloride 90 (L) 101 - 111 mmol/L   CO2 19 (L) 22 - 32 mmol/L   Glucose, Bld 209 (H) 65 - 99 mg/dL   BUN 43 (H) 6 - 20 mg/dL   Creatinine, Ser 4.30 (H) 0.44 - 1.00 mg/dL    Comment: DELTA CHECK NOTED   Calcium 7.5 (L) 8.9 - 10.3 mg/dL   GFR calc non Af Amer 13 (L) >60 mL/min   GFR calc Af Amer 15 (L) >60 mL/min    Comment: (NOTE) The eGFR has been calculated using the CKD EPI equation. This calculation has not been validated in all clinical situations. eGFR's persistently <60 mL/min signify possible Chronic Kidney Disease.    Anion gap 17 (H) 5 - 15  Glucose, capillary     Status: Abnormal   Collection Time: 05/02/15  6:27 AM  Result Value Ref Range   Glucose-Capillary 189 (H) 65 - 99 mg/dL  Glucose, capillary     Status: Abnormal   Collection Time: 05/02/15  8:18 AM  Result Value Ref Range   Glucose-Capillary 221 (H) 65 - 99 mg/dL  Glucose, capillary     Status: Abnormal   Collection Time: 05/02/15 11:12 AM  Result Value Ref Range   Glucose-Capillary 224 (H) 65 - 99 mg/dL   Comment 1 Notify RN   Vancomycin, random     Status:  None   Collection Time: 05/02/15 12:03 PM  Result Value Ref Range   Vancomycin Rm 18 ug/mL    Comment:        Random Vancomycin therapeutic range is dependent on dosage and time of specimen collection. A peak range is 20.0-40.0 ug/mL A trough range is 5.0-15.0 ug/mL              Component Value Date/Time   SDES FLUID RIGHT SHOULDER JOINT 04/30/2015 1948   SDES FLUID RIGHT SHOULDER JOINT 04/30/2015 1948   SPECREQUEST PATIENT ON FOLLOWING VANCOMYCIN ZOSYN C 04/30/2015 1948   [place] PATIENT ON FOLLOWING VANC,ZOSYN 04/30/2015 1948   CULT  04/30/2015 1948    No Anaerobes Isolated; Culture in Progress for 14 DAYS Performed at [facility]  04/30/2015  1948    NO GROWTH 1 DAY Performed at [facility] PENDING 04/30/2015 1948   REPTSTATUS PENDING 04/30/2015 1948   No results found. Recent Results (from the past 240 hour(s))  Culture, routine-abscess     Status: None (Preliminary result)   Collection Time: 04/27/15  6:36 AM  Result Value Ref Range Status   Specimen Description ABSCESS RIGHT SHOULDER  Final   Special Requests NONE  Final   Gram Stain   Final    FEW WBC PRESENT,BOTH PMN AND MONONUCLEAR RARE GRAM NEGATIVE RODS Performed at [facility]    Culture   Final    NO GROWTH 3 DAYS Note: CONTINUING TO HOLD FOR 14 DAYS Performed at [facility]    Report Status PENDING  Incomplete  Surgical pcr screen     Status: Abnormal   Collection Time: 04/30/15  3:23 PM  Result Value Ref Range Status   MRSA, PCR NEGATIVE NEGATIVE Final   Staphylococcus aureus POSITIVE (A) NEGATIVE Final    Comment:        The Xpert SA Assay (FDA approved for NASAL specimens in patients over 56 years of age), is one component of a comprehensive surveillance program.  Test performance has been validated by [facility] for patients greater than or equal to 101 year old. It is not intended to diagnose infection nor to guide or monitor treatment.   Anaerobic culture     Status: None (Preliminary result)   Collection Time: 04/30/15  7:41 PM  Result Value Ref Range Status   Specimen Description WOUND RIGHT SHOULDER  Final   Special Requests RIGHT SHOULDER  Final   Gram Stain PENDING  Incomplete   Culture   Final    No Anaerobes Isolated; Culture in Progress for 14 DAYS Performed at [facility]    Report Status PENDING  Incomplete  Culture, routine-abscess     Status: None (Preliminary result)   Collection Time: 04/30/15  7:41 PM  Result Value Ref Range Status   Specimen Description ABSCESS RIGHT SHOULDER  Final   Special Requests PATIENT ON FOLLOWING VANCOMYCIN ZOSYN  Final   Gram Stain    Final    NO WBC SEEN NO SQUAMOUS EPITHELIAL CELLS SEEN NO ORGANISMS SEEN Performed at [facility]    Culture   Final    NO GROWTH 1 DAY Performed at [facility]    Report Status PENDING  Incomplete  Anaerobic culture     Status: None (Preliminary result)   Collection Time: 04/30/15  7:47 PM  Result Value Ref Range Status   Specimen Description ABSCESS  DRAINAGE FROM RIGHT SHOULDER  Final  Special Requests PATIENT ON FOLLOWING VANCOMYCIN ZOSYN B  Final   Gram Stain PENDING  Incomplete   Culture   Final    NO ANAEROBES ISOLATED; CULTURE IN PROGRESS FOR 5 DAYS Performed at [facility]    Report Status PENDING  Incomplete  Culture, routine-abscess     Status: None (Preliminary result)   Collection Time: 04/30/15  7:47 PM  Result Value Ref Range Status   Specimen Description ABSCESS RIGHT SHOULDER DRAINAGE  Final   Special Requests   Final    PATIENT ON FOLLOWING VANCOMYCIN ZOSYN SPECIMEN B 2 CUPS   Gram Stain   Final    NO WBC SEEN NO SQUAMOUS EPITHELIAL CELLS SEEN NO ORGANISMS SEEN Performed at [facility]    Culture   Final    NO GROWTH 1 DAY Performed at [facility]    Report Status PENDING  Incomplete  Anaerobic culture     Status: None (Preliminary result)   Collection Time: 04/30/15  7:48 PM  Result Value Ref Range Status   Specimen Description FLUID RIGHT SHOULDER JOINT  Final   Special Requests PATIENT ON FOLLOWING VANCOMYCIN ZOSYN C  Final   Gram Stain PENDING  Incomplete   Culture   Final    No Anaerobes Isolated; Culture in Progress for 14 DAYS Performed at [facility]    Report Status PENDING  Incomplete  Body fluid culture     Status: None (Preliminary result)   Collection Time: 04/30/15  7:48 PM  Result Value Ref Range Status   Specimen Description FLUID RIGHT SHOULDER JOINT  Final   Special Requests PATIENT ON FOLLOWING VANC,ZOSYN  Final   Gram Stain   Final    RARE WBC PRESENT, PREDOMINANTLY  PMN NO ORGANISMS SEEN Performed at [facility]    Culture   Final    NO GROWTH 1 DAY Performed at [facility]    Report Status PENDING  Incomplete      05/02/2015, 2:37 PM     LOS: 2 days

## 2015-05-02 NOTE — Consult Note (Signed)
Reason for Consult: Acute renal failure Referring Physician: Thad Ranger M.D. [facility])  HPI:  34 year old woman with a history of hypertension, type 2 diabetes mellitus, bronchial asthma, obesity and a baseline normal renal function was admitted to the hospital 2 days ago with worsening right shoulder pain that did not improve following incision and drainage and outpatient treatment with doxycycline. Consequently, she was started on intravenous antibiotics with vancomycin and Zosyn and an MRI done of her right shoulder that showed a pocket of fluid communicating with the right shoulder joint raising concern for septic arthritis/myositis. She underwent emergent surgical arthroscopy and open incision and drainage 2 days ago as well as drainage of a right chest wall subcutaneous abscess.  Concern is raised postoperatively with her rising creatinine, impending metabolic acidosis and hyponatremia. From review of records, she has not had intravenous iodinated contrast, her vancomycin is currently on hold as is her lisinopril. She has had some significant hypotensive episodes overnight (as low as 76 systolic and 44 diastolic) with relative hypotension on 04/30/15 after initial presentation with hypertension. Her urinalysis shows trace proteinuria and 0-2 red blood cells by high-power field with amorphous urate crystals. Urine output charted is low.   02/13/2015  04/27/2015  04/30/2015  05/01/2015  05/02/2015   BUN (H) 43 (H)  Creatinine 0.60 0.76 1.05 (H) 2.28 (H) 4.30 (H)    Past Medical History  Diagnosis Date  . Hypertension   . Diabetes mellitus   . Asthma   . Obesity     Past Surgical History  Procedure Laterality Date  . Cesarean section    . Tonsillectomy    . Leg surgery    . Irrigation and debridement shoulder Right 04/30/2015    Procedure: IRRIGATION AND DEBRIDEMENT SHOULDER;  Surgeon: Sheral Apley, MD;  Location: [facility] OR;  Service: Orthopedics;  Laterality: Right;  . Shoulder  arthroscopy Right 04/30/2015    Procedure: ARTHROSCOPY SHOULDER;  Surgeon: Sheral Apley, MD;  Location: [facility] OR;  Service: Orthopedics;  Laterality: Right;  . Irrigation and debridement shoulder Right 05/01/2015    Family History  Problem Relation Age of Onset  . Diabetes Mother   . Hypertension Mother   . Heart disease Mother   . Diabetes Father   . Heart disease Father   . Stroke Maternal Grandmother   . Cancer Maternal Grandmother     Social History:  reports that she has never smoked. She has never used smokeless tobacco. She reports that she drinks alcohol. She reports that she does not use illicit drugs.  Allergies: No Known Allergies  Medications:  Scheduled: . aspirin  81 mg Oral Daily  . bisacodyl  10 mg Rectal Once  . docusate sodium  100 mg Oral BID  . gabapentin  300 mg Oral TID  . heparin  5,000 Units Subcutaneous 3 times per day  . insulin aspart  0-20 Units Subcutaneous TID WC  . insulin aspart  0-5 Units Subcutaneous QHS  . insulin aspart  15 Units Subcutaneous TID WC  . insulin glargine  10 Units Subcutaneous Once  . [START ON 05/03/2015] insulin glargine  70 Units Subcutaneous Daily  . piperacillin-tazobactam (ZOSYN)  IV  3.375 g Intravenous 3 times per day  . polyethylene glycol  17 g Oral Daily  . [START ON 05/06/2015] Vitamin D (Ergocalciferol)  50,000 Units Oral Q7 days   BMET  CBC Latest Ref Rng 05/02/2015 05/01/2015 04/30/2015  WBC 4.0 - 10.5 K/uL 19.5(H) 18.4(H) 17.1(H)  Hemoglobin 12.0 - 15.0 g/dL 4.6(F) 6.8(L) 10.2(L)  Hematocrit 36.0 - 46.0 % 29.3(L) 30.5(L) 31.3(L)  Platelets 150 - 400 K/uL 387 370 343   BMP Latest Ref Rng 05/02/2015 05/01/2015 04/30/2015  Glucose 65 - 99 mg/dL 275(T) 700(F) 749(S)  BUN 6 - 20 mg/dL 49(Q) 75(F) 19  Creatinine 0.44 - 1.00 mg/dL 1.63(W) 4.66(Z) 9.93  Sodium 135 - 145 mmol/L 126(L) 127(L) 123(L)  Potassium 3.5 - 5.1 mmol/L 4.9 4.2 4.0  Chloride 101 - 111 mmol/L 90(L) 91(L) 88(L)  CO2 22 - 32 mmol/L 19(L) 22 23   Calcium 8.9 - 10.3 mg/dL 7.5(L) 7.9(L) 8.3(L)   Urinalysis    Component Value Date/Time   COLORURINE YELLOW 05/02/2015 0130   APPEARANCEUR TURBID* 05/02/2015 0130   LABSPEC 1.026 05/02/2015 0130   PHURINE 5.0 05/02/2015 0130   GLUCOSEU >1000* 05/02/2015 0130   HGBUR TRACE* 05/02/2015 0130   BILIRUBINUR SMALL* 05/02/2015 0130   KETONESUR 15* 05/02/2015 0130   PROTEINUR 30* 05/02/2015 0130   UROBILINOGEN 0.2 05/02/2015 0130   NITRITE NEGATIVE 05/02/2015 0130   LEUKOCYTESUR SMALL* 05/02/2015 0130    Review of Systems  Constitutional: Positive for fever, chills and malaise/fatigue.  HENT: Negative.   Eyes: Negative.   Respiratory: Negative.   Cardiovascular: Positive for chest pain and palpitations. Negative for orthopnea and claudication.       Right chest wall pain  Gastrointestinal: Negative.   Genitourinary: Negative.   Musculoskeletal:       See history of present illness  Skin:       See history of present illness-recent cellulitis/chest wall abscess  Neurological: Positive for weakness. Negative for dizziness and tingling.  Psychiatric/Behavioral: The patient is nervous/anxious.   All other systems reviewed and are negative.  Blood pressure 84/65, pulse 107, temperature 98.3 F (36.8 C), temperature source Oral, resp. rate 16, height 5\' 8"  (1.727 m), weight 159.666 kg (352 lb), last menstrual period 04/23/2015, SpO2 94 %. Physical Exam  Nursing note and vitals reviewed. Constitutional: She is oriented to person, place, and time. She appears well-developed and well-nourished. No distress.  HENT:  Head: Normocephalic and atraumatic.  Nose: Nose normal.  Mouth/Throat: Oropharynx is clear and moist.  Eyes: Conjunctivae are normal. Pupils are equal, round, and reactive to light. No scleral icterus.  Neck: Normal range of motion. Neck supple. No JVD present.  Cardiovascular: Exam reveals no friction rub.   No murmur heard. Regular tachycardia  Respiratory: Effort  normal. She has no wheezes. She has no rales.  Decreased breath sounds over bases-poor inspiratory effort  GI: Soft. Bowel sounds are normal. She exhibits no distension. There is no tenderness.  Musculoskeletal:  Right shoulder/chest wall and clean, dry dressing  Lymphadenopathy:    She has no cervical adenopathy.  Neurological: She is oriented to person, place, and time.  Skin: Skin is warm and dry. No erythema.  Psychiatric: She has a normal mood and affect.    Assessment/Plan: 1. Acute renal failure: Currently oliguric, the current history, timeline of events and available database point towards ATN likely from relative hypotension/SIRS. Random vancomycin levels are low and unlikely to be the culprit for her ATN. Urine sediment does not point to an acute GN and urine electrolytes are pending. I have ordered for a renal ultrasound in order to rule out any obstruction. I agree with renal dosing of vancomycin/Zosyn in order to minimize toxicity. I recommend placement of Foley catheter for strict monitoring of input/output. Recommend daily labs and avoiding nephrotoxins including iodinated  intravenous contrast and nonsteroidal anti-inflammatory drugs. At this time, no acute indications for dialysis. 2. Hyponatremia: This is in part pseudohyponatremia from concomitant hyperglycemia but also true hyponatremia from acute renal failure/defective free water handling. We'll start oral fluid restriction and begin isotonic sodium bicarbonate (in sterile water). 3. Anion gap metabolic acidosis: Secondary to acute renal failure, start isotonic sodium bicarbonate for intravascular volume resuscitation/blood pressure support and acid buffering. 4. Right septic joint: Antibiotic therapy currently broad-spectrum with vancomycin/Zosyn, no growth/suspicious organisms and cultures yet.   Lindsay Bean K. 05/02/2015, 1:58 PM

## 2015-05-02 NOTE — Progress Notes (Addendum)
[facility]                                                                              Patient Demographics  Lindsay [name], is a 34 y.o. female, DOB - [birth year], ZOX:[id number]  Admit date - 04/30/2015   Admitting Physician Houston Siren, MD  Outpatient Primary MD for the patient is Doris Cheadle, MD  LOS - 2   Chief Complaint  Patient presents with  . Shoulder Pain       Brief HPI   The patient is a 34 year old female with obesity, asthma, diabetes, hypertension presented to ED with right shoulder worsening pain. Patient was recently seen in the ED and had a small I&D done in ER and was placed on doxycycline. Patient however continued to have increased pain but no subjective fevers or chills. Evaluation in ED included a marked leukocytosis with WBC of 18 K and elevated blood sugar in 400s. Patient was started on IV vancomycin and hospitalist was asked to admit for cellulitis and hyperglycemia.   Assessment & Plan    Principal Problem:   Cellulitis of right shoulder and chest wall with abscess postop day #2 - MRI of the right shoulder which showed severe muscle edema with small 5 x 6x 25 mm fluid collection overall appearance concerning for myositis due to infectious or inflammatory etiology, large glenohumeral joint effusion, possible septic arthritis - underwent irrigation and debridement of the right shoulder with arthroscopy and patient was also found to have right chest wall subcutaneous abscess and Dr. Derrell Lolling was consulted intraoperatively. Patient underwent I&D of the right chest wall abscess as well. - Continue IV antibiotics and follow cultures, still leukocytosis afebrile, and dressing changes as recommended by general surgery and orthopedics - Pain control - Vancomycin discontinued, creatinine function jumped to 4.3 today, will request infectious disease consult, for now continue IV Zosyn   Active Problems:   Diabetes mellitus due to underlying  condition : Still Uncontrolled likely worse due to infection and #1 - Increased her Lantus to 70 units, increase meal coverage, placed on the resistance sliding scale  Acute kidney injury - Creatinine worsened to 4.3 today, with metabolic acidosis - Discontinued vancomycin, continue IV fluid hydration, ordered urine lites, recheck BMET in a.m. - Requested renal consult, d/w Dr Zetta Bills    Obesity - Patient counseled on diet and weight control    HTN (hypertension) -Currently BP borderline soft, not on any antihypertensive    Hyponatremia: Improving, also some component of pseudohyponatremia due to hyperglycemia -Continue IV fluid hydration  Code Status: Full code   Family Communication: Discussed in detail with the patient, all imaging results, lab results explained to the patient   Disposition Plan: Not medically ready   Time Spent in minutes   25 minutes  Procedures  Irrigation and debridement of the right shoulder   I&D of the chest wall  Consults   Orthopedics, Dr. Eulah Pont   general surgery, Dr. Derrell Lolling  DVT Prophylaxis heparin subcutaneous   Medications  Scheduled Meds: . aspirin  81 mg Oral Daily  . docusate sodium  100 mg Oral BID  . gabapentin  300  mg Oral TID  . heparin  5,000 Units Subcutaneous 3 times per day  . insulin aspart  0-20 Units Subcutaneous TID WC  . insulin aspart  0-5 Units Subcutaneous QHS  . insulin aspart  15 Units Subcutaneous TID WC  . insulin glargine  10 Units Subcutaneous Once  . [START ON 05/03/2015] insulin glargine  70 Units Subcutaneous Daily  . piperacillin-tazobactam (ZOSYN)  IV  3.375 g Intravenous 3 times per day  . polyethylene glycol  17 g Oral Daily  . [START ON 05/06/2015] Vitamin D (Ergocalciferol)  50,000 Units Oral Q7 days   Continuous Infusions: . sodium chloride 125 mL/hr at 05/02/15 0549  . lactated ringers 20 mL/hr at 04/30/15 1631   PRN Meds:.acetaminophen, HYDROmorphone (DILAUDID) injection, metoCLOPramide  **OR** metoCLOPramide (REGLAN) injection, ondansetron **OR** ondansetron (ZOFRAN) IV, oxyCODONE-acetaminophen   Antibiotics   Anti-infectives    Start     Dose/Rate Route Frequency Ordered Stop   05/01/15 0200  cefTRIAXone (ROCEPHIN) 2 g in dextrose 5 % 50 mL IVPB - Premix  Status:  Discontinued     2 g 100 mL/hr over 30 Minutes Intravenous Every 24 hours 04/30/15 0427 04/30/15 0808   04/30/15 1900  ceFAZolin (ANCEF) 3 g in dextrose 5 % 50 mL IVPB     3 g 160 mL/hr over 30 Minutes Intravenous  Once 04/30/15 1854 04/30/15 1946   04/30/15 1200  vancomycin (VANCOCIN) 1,500 mg in sodium chloride 0.9 % 500 mL IVPB  Status:  Discontinued     1,500 mg 250 mL/hr over 120 Minutes Intravenous Every 8 hours 04/30/15 0436 05/01/15 1350   04/30/15 0830  piperacillin-tazobactam (ZOSYN) IVPB 3.375 g     3.375 g 12.5 mL/hr over 240 Minutes Intravenous 3 times per day 04/30/15 0824     04/30/15 0445  vancomycin (VANCOCIN) 1,500 mg in sodium chloride 0.9 % 500 mL IVPB     1,500 mg 250 mL/hr over 120 Minutes Intravenous  Once 04/30/15 0436 04/30/15 0707   04/30/15 0315  cefTRIAXone (ROCEPHIN) 2 g in dextrose 5 % 50 mL IVPB     2 g 100 mL/hr over 30 Minutes Intravenous  Once 04/30/15 0312 04/30/15 0433   04/30/15 0115  vancomycin (VANCOCIN) IVPB 1000 mg/200 mL premix     1,000 mg 200 mL/hr over 60 Minutes Intravenous  Once 04/30/15 0111 04/30/15 0302        Subjective:   Lindsay [name] was seen and examined today. No acute complaints, pain is controlled, sitting in the chair eating breakfast at the time of my examination. Patient denies dizziness, chest pain, shortness of breath, abdominal pain, N/V, new weakness, numbess, tingling. No acute events overnight.  Complaining of constipation  Objective:   Blood pressure 93/44, pulse 106, temperature 98 F (36.7 C), temperature source Oral, resp. rate 16, height 5\' 8"  (1.727 m), weight 159.666 kg (352 lb), last menstrual period 04/23/2015, SpO2 100  %.  Wt Readings from Last 3 Encounters:  04/30/15 159.666 kg (352 lb)  04/03/15 156.945 kg (346 lb)  02/13/15 154.949 kg (341 lb 9.6 oz)     Intake/Output Summary (Last 24 hours) at 05/02/15 1311 Last data filed at 05/02/15 0900  Gross per 24 hour  Intake   2850 ml  Output    150 ml  Net   2700 ml    Exam  General: Alert and oriented x 3, NAD  HEENT:  PERRLA, EOMI  Neck: Supple, no JVD, no masses  CVS: S1 S2 auscultated, RRR  Respiratory: CTAB  Abdomen: Soft, nontender, nondistended, + bowel sounds  Ext: no cyanosis clubbing or edema, dressing intact on the right chest and shoulder   Neuro: no new focal neurological deficits  Skin:  dressing intact on the right chest and shoulder  Psych: Normal affect and demeanor, alert and oriented x3    Data Review   Micro Results Recent Results (from the past 240 hour(s))  Culture, routine-abscess     Status: None (Preliminary result)   Collection Time: 04/27/15  6:36 AM  Result Value Ref Range Status   Specimen Description ABSCESS RIGHT SHOULDER  Final   Special Requests NONE  Final   Gram Stain   Final    FEW WBC PRESENT,BOTH PMN AND MONONUCLEAR RARE GRAM NEGATIVE RODS Performed at [facility]    Culture   Final    NO GROWTH 3 DAYS Note: CONTINUING TO HOLD FOR 14 DAYS Performed at [facility]    Report Status PENDING  Incomplete  Surgical pcr screen     Status: Abnormal   Collection Time: 04/30/15  3:23 PM  Result Value Ref Range Status   MRSA, PCR NEGATIVE NEGATIVE Final   Staphylococcus aureus POSITIVE (A) NEGATIVE Final    Comment:        The Xpert SA Assay (FDA approved for NASAL specimens in patients over 64 years of age), is one component of a comprehensive surveillance program.  Test performance has been validated by [facility] for patients greater than or equal to 17 year old. It is not intended to diagnose infection nor to guide or monitor treatment.   Anaerobic culture      Status: None (Preliminary result)   Collection Time: 04/30/15  7:41 PM  Result Value Ref Range Status   Specimen Description WOUND RIGHT SHOULDER  Final   Special Requests RIGHT SHOULDER  Final   Gram Stain PENDING  Incomplete   Culture   Final    No Anaerobes Isolated; Culture in Progress for 14 DAYS Performed at [facility]    Report Status PENDING  Incomplete  Culture, routine-abscess     Status: None (Preliminary result)   Collection Time: 04/30/15  7:41 PM  Result Value Ref Range Status   Specimen Description ABSCESS RIGHT SHOULDER  Final   Special Requests PATIENT ON FOLLOWING VANCOMYCIN ZOSYN  Final   Gram Stain   Final    NO WBC SEEN NO SQUAMOUS EPITHELIAL CELLS SEEN NO ORGANISMS SEEN Performed at [facility]    Culture   Final    NO GROWTH 1 DAY Performed at [facility]    Report Status PENDING  Incomplete  Anaerobic culture     Status: None (Preliminary result)   Collection Time: 04/30/15  7:47 PM  Result Value Ref Range Status   Specimen Description ABSCESS  DRAINAGE FROM RIGHT SHOULDER  Final   Special Requests PATIENT ON FOLLOWING VANCOMYCIN ZOSYN B  Final   Gram Stain PENDING  Incomplete   Culture   Final    NO ANAEROBES ISOLATED; CULTURE IN PROGRESS FOR 5 DAYS Performed at [facility]    Report Status PENDING  Incomplete  Culture, routine-abscess     Status: None (Preliminary result)   Collection Time: 04/30/15  7:47 PM  Result Value Ref Range Status   Specimen Description ABSCESS RIGHT SHOULDER DRAINAGE  Final   Special Requests   Final    PATIENT ON FOLLOWING VANCOMYCIN ZOSYN SPECIMEN B 2 CUPS   Gram  Stain   Final    NO WBC SEEN NO SQUAMOUS EPITHELIAL CELLS SEEN NO ORGANISMS SEEN Performed at [facility]    Culture   Final    NO GROWTH 1 DAY Performed at [facility]    Report Status PENDING  Incomplete  Anaerobic culture     Status: None (Preliminary result)   Collection Time: 04/30/15   7:48 PM  Result Value Ref Range Status   Specimen Description FLUID RIGHT SHOULDER JOINT  Final   Special Requests PATIENT ON FOLLOWING VANCOMYCIN ZOSYN C  Final   Gram Stain PENDING  Incomplete   Culture   Final    No Anaerobes Isolated; Culture in Progress for 14 DAYS Performed at [facility]    Report Status PENDING  Incomplete  Body fluid culture     Status: None (Preliminary result)   Collection Time: 04/30/15  7:48 PM  Result Value Ref Range Status   Specimen Description FLUID RIGHT SHOULDER JOINT  Final   Special Requests PATIENT ON FOLLOWING VANC,ZOSYN  Final   Gram Stain   Final    RARE WBC PRESENT, PREDOMINANTLY PMN NO ORGANISMS SEEN Performed at [facility]    Culture   Final    NO GROWTH 1 DAY Performed at [facility]    Report Status PENDING  Incomplete    Radiology Reports Dg Shoulder Right  04/19/2015   CLINICAL DATA:  Acute onset of right shoulder pain. Heard right shoulder pop 2 days ago. Initial encounter.  EXAM: RIGHT SHOULDER - 2+ VIEW  COMPARISON:  None.  FINDINGS: There is no evidence of fracture or dislocation. The right humeral head is seated within the glenoid fossa. The acromioclavicular joint is unremarkable in appearance. No significant soft tissue abnormalities are seen. The visualized portions of the right lung are clear.  IMPRESSION: No evidence of fracture or dislocation.   Electronically Signed   By: Roanna Raider M.D.   On: 04/19/2015 01:26   Ct Shoulder Right Wo Contrast  04/27/2015   CLINICAL DATA:  Constant right shoulder pain for 10 days  EXAM: CT OF THE RIGHT SHOULDER WITHOUT CONTRAST  TECHNIQUE: Multidetector CT imaging was performed according to the standard protocol. Multiplanar CT image reconstructions were also generated.  COMPARISON:  Radiographs 04/19/2015  FINDINGS: There is no fracture or dislocation. There is no bone lesion. There is mild osteolysis of the distal clavicle. This can be seen after trauma or with  inflammatory arthropathies. It can be idiopathic. No soft tissue mass is evident. There is no abnormal fluid collection. Visible portions of the right ribs and chest wall appear unremarkable.  IMPRESSION: Osteolysis of the distal right clavicle, uncertain chronicity.   Electronically Signed   By: Ellery Plunk M.D.   On: 04/27/2015 03:43   Mr Shoulder Right Wo Contrast  04/30/2015   CLINICAL DATA:  Right shoulder pain. Recent history of antibiotic use. Patient was in too much pain to continue with the contrasted portion of the exam.  EXAM: MRI OF THE RIGHT SHOULDER WITHOUT CONTRAST  TECHNIQUE: Multiplanar, multisequence MR imaging of the shoulder was performed. No intravenous contrast was administered.  COMPARISON:  None.  FINDINGS: Rotator cuff: Mild tendinosis of the supraspinatus and infraspinatus tendon. Teres minor tendon is intact. Subscapularis tendon is intact.  Muscles: There is severe muscle edema in the subscapularis muscle with small 5 x 6 x 25 mm fluid collection. There is severe muscle edema in the anterior and posterior deltoid muscles with  a 14 mm fluid collection in the deltoid muscle. There is soft tissue edema superficial to the subscapularis and infraspinatus muscles. There is mild muscle edema along the periphery of the teres minor muscle.  Biceps long head:  Intact.  Acromioclavicular Joint: Mild degenerative change of the acromioclavicular joint. Type II acromion.  Glenohumeral Joint: Large joint effusion.  No chondral defect.  Labrum:  Intact.  Bones: No focal marrow signal abnormality. No fracture or dislocation.  IMPRESSION: 1. Severe muscle edema in the subscapularis muscle with small 5 x 6 x 25 mm fluid collection. There is severe muscle edema in the anterior and posterior deltoid muscles with a 14 mm fluid collection in the deltoid muscle. Soft tissue edema superficial to the subscapularis and infraspinatus muscles. Mild muscle edema along the periphery of the teres minor muscle.  The overall appearance is most concerning for myositis which may be secondary to an infectious or inflammatory etiology. The small fluid collections make the process more concerning for an infectious etiology. 2. Large glenohumeral joint effusion. Septic arthritis cannot be excluded.   Electronically Signed   By: Elige Ko   On: 04/30/2015 10:45    CBC  Recent Labs Lab 04/27/15 0244 04/30/15 0123 04/30/15 0627 05/01/15 0531 05/02/15 0517  WBC 13.6* 18.2* 17.1* 18.4* 19.5*  HGB 11.5* 10.7* 10.2* 9.7* 9.5*  HCT 34.2* 32.5* 31.3* 30.5* 29.3*  PLT 377 353 343 370 387  MCV 74.0* 75.1* 74.7* 76.3* 75.9*  MCH 24.9* 24.7* 24.3* 24.3* 24.6*  MCHC 33.6 32.9 32.6 31.8 32.4  RDW 13.9 14.1 14.2 14.6 15.3  LYMPHSABS 1.9 0.0*  --   --   --   MONOABS 1.1* 0.0*  --   --   --   EOSABS 0.1 0.0  --   --   --   BASOSABS 0.0 0.0  --   --   --     Chemistries   Recent Labs Lab 04/27/15 0244 04/30/15 0123 04/30/15 0627 05/01/15 0531 05/02/15 0517  NA 126* 121* 123* 127* 126*  K 4.8 4.2 4.0 4.2 4.9  CL 92* 86* 88* 91* 90*  CO2 21* 19*  GLUCOSE 432* 438* 414* 291* 209*  BUN 28* 43*  CREATININE 0.76 1.05* 0.89 2.28* 4.30*  CALCIUM 8.9 8.3* 8.3* 7.9* 7.5*  AST  --  19  --   --   --   ALT  --  14  --   --   --   ALKPHOS  --  170*  --   --   --   BILITOT  --  0.7  --   --   --    ------------------------------------------------------------------------------------------------------------------ estimated creatinine clearance is 30 mL/min (by C-G formula based on Cr of 4.3). ------------------------------------------------------------------------------------------------------------------  Recent Labs  04/30/15 0755  HGBA1C 12.7*   ------------------------------------------------------------------------------------------------------------------ No results for input(s): CHOL, HDL, LDLCALC, TRIG, CHOLHDL, LDLDIRECT in the last 72  hours. ------------------------------------------------------------------------------------------------------------------ No results for input(s): TSH, T4TOTAL, T3FREE, THYROIDAB in the last 72 hours.  Invalid input(s): FREET3 ------------------------------------------------------------------------------------------------------------------ No results for input(s): VITAMINB12, FOLATE, FERRITIN, TIBC, IRON, RETICCTPCT in the last 72 hours.  Coagulation profile No results for input(s): INR, PROTIME in the last 168 hours.  No results for input(s): DDIMER in the last 72 hours.  Cardiac Enzymes No results for input(s): CKMB, TROPONINI, MYOGLOBIN in the last 168 hours.  Invalid input(s): CK ------------------------------------------------------------------------------------------------------------------ Invalid input(s): POCBNP   Recent Labs  05/01/15 1116 05/01/15 1559 05/01/15 2119 05/02/15 1610  05/02/15 0818 05/02/15 1112  GLUCAP 359* 276* 226* 189* 221* 224*     RAI,RIPUDEEP M.D. [facility] 05/02/2015, 1:11 PM  Pager: [phone]   Between 7am to 7pm - call Pager - [phone]  After 7pm go to [email or web address] - password [email or web address]  Call night coverage person covering after 7pm

## 2015-05-02 NOTE — Clinical Documentation Improvement (Signed)
Abnormal Lab and/or Testing Results: 5/12:  Abnormal urinalysis. 5/12:  Abnormal urine micro.   Possible Clinical Conditions: >  UTI >  Other >  Not able to determine   Thank you,  Debria Garret Documentation Specialist [phone] [email or web address]

## 2015-05-02 NOTE — Care Management Note (Signed)
Case Management Note  Patient Details  Name: Lindsay [name] MRN: [id number] Date of Birth: 18-Mar-1981  Subjective/Objective:    Patient admitted for cellulitis of right upper arm and chest wall. Patient underwent I & D.                Action/Plan:  Case Manager spoke with patient concerning need for Home Health RN for IV antibiotics. Referral called to T[facility], [facility] Lia[facility]. Case manager will continue to monitor for discharge needs.   Expected Discharge Date: 05/04/15                  Expected Discharge Plan: Home with [facility]     In-House Referral:  NA  Discharge planning Services  CM Consult  Post Acute Care Choice:  Home Health Choice offered to:     DME Arranged:    DME Agency:     HH Arranged:  RN HH Agency:  [facility]  Status of Service:  In process, will continue to follow  Medicare Important Message Given:    Date Medicare IM Given:    Medicare IM give by:    Date Additional Medicare IM Given:    Additional Medicare Important Message give by:     If discussed at Long Length of Stay Meetings, dates discussed:    Additional Comments:  Durenda Guthrie, RN 05/02/2015, 1:34 PM

## 2015-05-03 ENCOUNTER — Inpatient Hospital Stay (HOSPITAL_COMMUNITY): Payer: No Typology Code available for payment source

## 2015-05-03 DIAGNOSIS — M009 Pyogenic arthritis, unspecified: Secondary | ICD-10-CM

## 2015-05-03 DIAGNOSIS — E669 Obesity, unspecified: Secondary | ICD-10-CM

## 2015-05-03 DIAGNOSIS — L02413 Cutaneous abscess of right upper limb: Secondary | ICD-10-CM

## 2015-05-03 DIAGNOSIS — M7989 Other specified soft tissue disorders: Secondary | ICD-10-CM

## 2015-05-03 DIAGNOSIS — N179 Acute kidney failure, unspecified: Secondary | ICD-10-CM | POA: Insufficient documentation

## 2015-05-03 LAB — GLUCOSE, CAPILLARY
GLUCOSE-CAPILLARY: 117 mg/dL — AB (ref 65–99)
GLUCOSE-CAPILLARY: 181 mg/dL — AB (ref 65–99)
Glucose-Capillary: 135 mg/dL — ABNORMAL HIGH (ref 65–99)
Glucose-Capillary: 176 mg/dL — ABNORMAL HIGH (ref 65–99)

## 2015-05-03 LAB — CBC
HCT: 28.6 % — ABNORMAL LOW (ref 36.0–46.0)
Hemoglobin: 9.4 g/dL — ABNORMAL LOW (ref 12.0–15.0)
MCH: 24.7 pg — ABNORMAL LOW (ref 26.0–34.0)
MCHC: 32.9 g/dL (ref 30.0–36.0)
MCV: 75.1 fL — ABNORMAL LOW (ref 78.0–100.0)
PLATELETS: 440 10*3/uL — AB (ref 150–400)
RBC: 3.81 MIL/uL — ABNORMAL LOW (ref 3.87–5.11)
RDW: 15.1 % (ref 11.5–15.5)
WBC: 16.3 10*3/uL — AB (ref 4.0–10.5)

## 2015-05-03 LAB — BASIC METABOLIC PANEL
Anion gap: 16 — ABNORMAL HIGH (ref 5–15)
BUN: 53 mg/dL — ABNORMAL HIGH (ref 6–20)
CO2: 19 mmol/L — ABNORMAL LOW (ref 22–32)
Calcium: 7.7 mg/dL — ABNORMAL LOW (ref 8.9–10.3)
Chloride: 92 mmol/L — ABNORMAL LOW (ref 101–111)
Creatinine, Ser: 5.17 mg/dL — ABNORMAL HIGH (ref 0.44–1.00)
GFR, EST AFRICAN AMERICAN: 12 mL/min — AB (ref >60)
GFR, EST NON AFRICAN AMERICAN: 10 mL/min — AB (ref >60)
GLUCOSE: 138 mg/dL — AB (ref 65–99)
POTASSIUM: 4.1 mmol/L (ref 3.5–5.1)
SODIUM: 127 mmol/L — AB (ref 135–145)

## 2015-05-03 LAB — HIV ANTIBODY (ROUTINE TESTING W REFLEX): HIV Screen 4th Generation wRfx: NONREACTIVE

## 2015-05-03 MED ORDER — LINEZOLID 600 MG/300ML IV SOLN
600.0000 mg | Freq: Two times a day (BID) | INTRAVENOUS | Status: DC
  Administered 2015-05-03 – 2015-05-06 (×7): 600 mg via INTRAVENOUS
  Filled 2015-05-03 (×8): qty 300

## 2015-05-03 MED ORDER — PIPERACILLIN-TAZOBACTAM IN DEX 2-0.25 GM/50ML IV SOLN
2.2500 g | Freq: Three times a day (TID) | INTRAVENOUS | Status: DC
  Administered 2015-05-03 – 2015-05-07 (×12): 2.25 g via INTRAVENOUS
  Filled 2015-05-03 (×15): qty 50

## 2015-05-03 MED ORDER — LIDOCAINE HCL 1 % IJ SOLN
INTRAMUSCULAR | Status: AC
  Filled 2015-05-03: qty 20

## 2015-05-03 NOTE — Procedures (Signed)
LIJV tunneled PICC SVC RA No comp

## 2015-05-03 NOTE — Progress Notes (Signed)
Subjective:  Seen- is very slow to move- also slightly lethargic Objective Vital signs in last 24 hours: Filed Vitals:   05/02/15 1900 05/02/15 2222 05/03/15 0013 05/03/15 0443  BP: 123/57   112/58  Pulse: 111   103  Temp: 100.5 F (38.1 C) 100.9 F (38.3 C) 100.4 F (38 C) 97.8 F (36.6 C)  TempSrc:      Resp: 15   18  Height:      Weight:      SpO2: 90%   95%   Weight change:   Intake/Output Summary (Last 24 hours) at 05/03/15 1126 Last data filed at 05/03/15 1003  Gross per 24 hour  Intake    480 ml  Output    250 ml  Net    63 ml   34 year old with HTN/DM/obesity/asthma presenting with septic arthritis of shoulder s/p surgical management on 5/10- now with complicating AKI with baseline normal renal function   Assessment/Plan: 1. Acute renal failure: Currently oliguric, although UOP does seem to be picking up. The current history, timeline of events and available database point towards ATN likely from relative hypotension/SIRS related to infection and surgery. Random vancomycin levels are low and unlikely to be the culprit for her ATN. Urine sediment does not point to an acute GN and urine electrolytes are pending. Renal ultrasound is normal. I agree with renal dosing of vancomycin/Zosyn in order to minimize toxicity. I recommend placement of Foley catheter for strict monitoring of input/output. Recommend daily labs and avoiding nephrotoxins including iodinated intravenous contrast and nonsteroidal anti-inflammatory drugs. At this time, still no acute indications for dialysis. 2. Hyponatremia: This is in part pseudohyponatremia from concomitant hyperglycemia but also true hyponatremia from acute renal failure/defective free water handling. We'll start oral fluid restriction and begin isotonic sodium bicarbonate (in sterile water). Has improved 3. Anion gap metabolic acidosis: Secondary to acute renal failure, start isotonic sodium bicarbonate for intravascular volume  resuscitation/blood pressure support and acid buffering. To continue bicarb based fluids 4. Right septic joint: Antibiotic therapy currently broad-spectrum with vancomycin/Zosyn, no growth/suspicious organisms and cultures yet.   Myrta Mercer A    Labs: Basic Metabolic Panel:  Recent Labs Lab 05/02/15 0517 05/02/15 1848 05/03/15 0342  NA 126* 124* 127*  K 4.9 4.2 4.1  CL 90* 88* 92*  CO2 19* 21* 19*  GLUCOSE 209* 224* 138*  BUN 43* 48* 53*  CREATININE 4.30* 4.80* 5.17*  CALCIUM 7.5* 7.5* 7.7*   Liver Function Tests:  Recent Labs Lab 04/30/15 0123  AST 19  ALT 14  ALKPHOS 170*  BILITOT 0.7  PROT 7.4  ALBUMIN 1.9*   No results for input(s): LIPASE, AMYLASE in the last 168 hours. No results for input(s): AMMONIA in the last 168 hours. CBC:  Recent Labs Lab 04/27/15 0244 04/30/15 0123 04/30/15 0627 05/01/15 0531 05/02/15 0517 05/02/15 1848 05/03/15 0342  WBC 13.6* 18.2* 17.1* 18.4* 19.5* 15.6* 16.3*  NEUTROABS 10.5* 0.0*  --   --   --   --   --   HGB 11.5* 10.7* 10.2* 9.7* 9.5* 9.0* 9.4*  HCT 34.2* 32.5* 31.3* 30.5* 29.3* 28.6* 28.6*  MCV 74.0* 75.1* 74.7* 76.3* 75.9* 76.1* 75.1*  PLT 377 353 343 370 387 412* 440*   Cardiac Enzymes:  Recent Labs Lab 05/02/15 1853  CKTOTAL 2196*   CBG:  Recent Labs Lab 05/02/15 0818 05/02/15 1112 05/02/15 1647 05/02/15 2137 05/03/15 0646  GLUCAP 221* 224* 264* 156* 117*    Iron Studies: No results  for input(s): IRON, TIBC, TRANSFERRIN, FERRITIN in the last 72 hours. Studies/Results: US Renal  05/02/2015   CLINICAL DATA:  Acute renal failure  EXAM: RENAL / URINARY TRACT ULTRASOUND COMPLETE  COMPARISON:  None.  FINDINGS: Right Kidney:  Length: 13.4 cm. Echogenicity within normal limits. No mass or hydronephrosis visualized.  Left Kidney:  Length: 12.8 cm. Echogenicity within normal limits. No mass or hydronephrosis visualized.  Bladder:  Empty and therefore not well evaluated  IMPRESSION: Study limited by  body habitus but appears normal.   Electronically Signed   By: Esperanza Heir M.D.   On: 05/02/2015 16:47   Medications: Infusions: .  sodium bicarbonate 150 mEq in sterile water 1000 mL infusion 125 mL/hr at 05/03/15 0230    Scheduled Medications: . bisacodyl  10 mg Rectal Once  . docusate sodium  100 mg Oral BID  . gabapentin  300 mg Oral TID  . insulin aspart  0-20 Units Subcutaneous TID WC  . insulin aspart  0-5 Units Subcutaneous QHS  . insulin aspart  15 Units Subcutaneous TID WC  . insulin glargine  70 Units Subcutaneous Daily  . linezolid  600 mg Intravenous Q12H  . piperacillin-tazobactam (ZOSYN)  IV  3.375 g Intravenous 3 times per day  . polyethylene glycol  17 g Oral Daily  . [START ON 05/06/2015] Vitamin D (Ergocalciferol)  50,000 Units Oral Q7 days    have reviewed scheduled and prn medications.  Physical Exam: General: NAD Heart: tachy Lungs: mostly clear Abdomen: soft non tender Extremities: obese extremities- mild edema    05/03/2015,11:26 AM  LOS: 3 days

## 2015-05-03 NOTE — Progress Notes (Signed)
*  PRELIMINARY RESULTS* Vascular Ultrasound Right upper extremity venous duplex has been completed.  Preliminary findings: no obvious evidence of DVT in visualized veins.  Farrel Demark, RDMS, RVT  05/03/2015, 2:52 PM

## 2015-05-03 NOTE — Progress Notes (Signed)
Patient ID: Lindsay [name], female   DOB: 03/11/1981, 34 y.o.   MRN: [id number]   LOS: 3 days   Subjective: Feels a little bit better but noticed her hand and arm starting to swell yesterday.   Objective: Vital signs in last 24 hours: Temp:  [97.8 F (36.6 C)-100.9 F (38.3 C)] 97.8 F (36.6 C) (05/13 0443) Pulse Rate:  [103-111] 103 (05/13 0443) Resp:  [15-18] 18 (05/13 0443) BP: (84-123)/(44-65) 112/58 mmHg (05/13 0443) SpO2:  [90 %-95 %] 95 % (05/13 0443) Last BM Date:  (per patient 2 weeks ago)   Laboratory  CBC  Recent Labs  05/02/15 1848 05/03/15 0342  WBC 15.6* 16.3*  HGB 9.0* 9.4*  HCT 28.6* 28.6*  PLT 412* 440*   BMET  Recent Labs  05/02/15 1848 05/03/15 0342  NA 124* 127*  K 4.2 4.1  CL 88* 92*  CO2 21* 19*  GLUCOSE 224* 138*  BUN 48* 53*  CREATININE 4.80* 5.17*  CALCIUM 7.5* 7.7*   CBG (last 3)   Recent Labs  05/02/15 1647 05/02/15 2137 05/03/15 0646  GLUCAP 264* 156* 117*    Physical Exam Incision/Wound: Wounds continue to drain, less odor today, no overlying erythema   Assessment/Plan: Right chest wall subcutaneous abscess -Dressing changes BID WD with 1/2 inch iodoform gauze packing strips -Antibiotics - on zosyn Day #4, Cubicin D #2, WBC is up to 16.3, await cultures, appreciate ID involvement -Tolerating carb mod diet Right shoulder abscess - per Dr. Eulah Pont  AKI - per primary service, worsening Morbid obesity - BMI 53.53 Uncontrolled DM -- Needs better glucose control, leave to primary team RUE edema -- Check UE doppler    Freeman Caldron, PA-C Pager: [phone] 05/03/2015

## 2015-05-03 NOTE — Progress Notes (Signed)
ANTIBIOTIC CONSULT NOTE - FOLLOW UP  Pharmacy Consult for Zosyn Indication: shoulder infection  No Known Allergies  Patient Measurements: Height:  (172.7 cm) Weight: (!) 352 lb (159.666 kg) IBW/kg (Calculated) : 63.9 Adjusted Body Weight:   Vital Signs: Temp: 99.1 F (37.3 C) (05/13 1401) Temp Source: Oral (05/13 1401) BP: 132/91 mmHg (05/13 1401) Pulse Rate: 112 (05/13 1401) Intake/Output from previous day: 05/12 0701 - 05/13 0700 In: 720 [P.O.:720] Out: 150 [Urine:150] Intake/Output from this shift: Total I/O In: -  Out: 100 [Urine:100]  Labs:  Recent Labs  05/02/15 0517 05/02/15 1749 05/02/15 1848 05/03/15 0342  WBC 19.5*  --  15.6* 16.3*  HGB 9.5*  --  9.0* 9.4*  PLT 387  --  412* 440*  LABCREA  --  215.39  --   --   CREATININE 4.30*  --  4.80* 5.17*   Estimated Creatinine Clearance: 25 mL/min (by C-G formula based on Cr of 5.17).  Recent Labs  05/01/15 1214 05/02/15 1203  VANCOTROUGH 34*  --   VANCORANDOM  --  18     Microbiology: Recent Results (from the past 720 hour(s))  Culture, routine-abscess     Status: None (Preliminary result)   Collection Time: 04/27/15  6:36 AM  Result Value Ref Range Status   Specimen Description ABSCESS RIGHT SHOULDER  Final   Special Requests NONE  Final   Gram Stain   Final    FEW WBC PRESENT,BOTH PMN AND MONONUCLEAR RARE GRAM NEGATIVE RODS Performed at [facility]    Culture   Final    NO GROWTH Note: CONTINUING TO HOLD FOR 14 DAYS Performed at [facility]    Report Status PENDING  Incomplete  Surgical pcr screen     Status: Abnormal   Collection Time: 04/30/15  3:23 PM  Result Value Ref Range Status   MRSA, PCR NEGATIVE NEGATIVE Final   Staphylococcus aureus POSITIVE (A) NEGATIVE Final    Comment:        The Xpert SA Assay (FDA approved for NASAL specimens in patients over 59 years of age), is one component of a comprehensive surveillance program.  Test performance has been  validated by [facility] for patients greater than or equal to 80 year old. It is not intended to diagnose infection nor to guide or monitor treatment.   Anaerobic culture     Status: None (Preliminary result)   Collection Time: 04/30/15  7:41 PM  Result Value Ref Range Status   Specimen Description WOUND RIGHT SHOULDER  Final   Special Requests RIGHT SHOULDER  Final   Gram Stain PENDING  Incomplete   Culture   Final    No Anaerobes Isolated; Culture in Progress for 14 DAYS Performed at [facility]    Report Status PENDING  Incomplete  Culture, routine-abscess     Status: None (Preliminary result)   Collection Time: 04/30/15  7:41 PM  Result Value Ref Range Status   Specimen Description ABSCESS RIGHT SHOULDER  Final   Special Requests PATIENT ON FOLLOWING VANCOMYCIN ZOSYN  Final   Gram Stain   Final    NO WBC SEEN NO SQUAMOUS EPITHELIAL CELLS SEEN NO ORGANISMS SEEN Performed at [facility]    Culture   Final    NO GROWTH 2 DAYS Performed at [facility]    Report Status PENDING  Incomplete  Anaerobic culture     Status: None (Preliminary result)   Collection Time: 04/30/15  7:47 PM  Result Value Ref Range Status   Specimen Description ABSCESS  DRAINAGE FROM RIGHT SHOULDER  Final   Special Requests PATIENT ON FOLLOWING VANCOMYCIN ZOSYN B  Final   Gram Stain PENDING  Incomplete   Culture   Final    NO ANAEROBES ISOLATED; CULTURE IN PROGRESS FOR 5 DAYS Performed at [facility]    Report Status PENDING  Incomplete  Culture, routine-abscess     Status: None (Preliminary result)   Collection Time: 04/30/15  7:47 PM  Result Value Ref Range Status   Specimen Description ABSCESS RIGHT SHOULDER DRAINAGE  Final   Special Requests   Final    PATIENT ON FOLLOWING VANCOMYCIN ZOSYN SPECIMEN B 2 CUPS   Gram Stain   Final    NO WBC SEEN NO SQUAMOUS EPITHELIAL CELLS SEEN NO ORGANISMS SEEN Performed at [facility]    Culture   Final     NO GROWTH 2 DAYS Performed at [facility]    Report Status PENDING  Incomplete  Anaerobic culture     Status: None (Preliminary result)   Collection Time: 04/30/15  7:48 PM  Result Value Ref Range Status   Specimen Description FLUID RIGHT SHOULDER JOINT  Final   Special Requests PATIENT ON FOLLOWING VANCOMYCIN ZOSYN C  Final   Gram Stain PENDING  Incomplete   Culture   Final    No Anaerobes Isolated; Culture in Progress for 14 DAYS Performed at [facility]    Report Status PENDING  Incomplete  Body fluid culture     Status: None (Preliminary result)   Collection Time: 04/30/15  7:48 PM  Result Value Ref Range Status   Specimen Description FLUID RIGHT SHOULDER JOINT  Final   Special Requests PATIENT ON FOLLOWING VANC,ZOSYN  Final   Gram Stain   Final    RARE WBC PRESENT, PREDOMINANTLY PMN NO ORGANISMS SEEN Performed at [facility]    Culture   Final    NO GROWTH 2 DAYS Performed at [facility]    Report Status PENDING  Incomplete    Anti-infectives    Start     Dose/Rate Route Frequency Ordered Stop   05/03/15 1000  linezolid (ZYVOX) IVPB 600 mg     600 mg 300 mL/hr over 60 Minutes Intravenous Every 12 hours 05/03/15 0752     05/03/15 0800  DAPTOmycin (CUBICIN) 1,000 mg in sodium chloride 0.9 % IVPB  Status:  Discontinued     1,000 mg 240 mL/hr over 30 Minutes Intravenous Every 48 hours 05/02/15 1515 05/03/15 0751   05/01/15 0200  cefTRIAXone (ROCEPHIN) 2 g in dextrose 5 % 50 mL IVPB - Premix  Status:  Discontinued     2 g 100 mL/hr over 30 Minutes Intravenous Every 24 hours 04/30/15 0427 04/30/15 0808   04/30/15 1900  ceFAZolin (ANCEF) 3 g in dextrose 5 % 50 mL IVPB     3 g 160 mL/hr over 30 Minutes Intravenous  Once 04/30/15 1854 04/30/15 1946   04/30/15 1200  vancomycin (VANCOCIN) 1,500 mg in sodium chloride 0.9 % 500 mL IVPB  Status:  Discontinued     1,500 mg 250 mL/hr over 120 Minutes Intravenous Every 8 hours 04/30/15 0436  05/01/15 1350   04/30/15 0830  piperacillin-tazobactam (ZOSYN) IVPB 3.375 g     3.375 g 12.5 mL/hr over 240 Minutes Intravenous 3 times per day 04/30/15 0824     04/30/15 0445  vancomycin (VANCOCIN) 1,500 mg in sodium chloride 0.9 %  500 mL IVPB     1,500 mg 250 mL/hr over 120 Minutes Intravenous  Once 04/30/15 0436 04/30/15 0707   04/30/15 0315  cefTRIAXone (ROCEPHIN) 2 g in dextrose 5 % 50 mL IVPB     2 g 100 mL/hr over 30 Minutes Intravenous  Once 04/30/15 0312 04/30/15 0433   04/30/15 0115  vancomycin (VANCOCIN) IVPB 1000 mg/200 mL premix     1,000 mg 200 mL/hr over 60 Minutes Intravenous  Once 04/30/15 0111 04/30/15 0302      Assessment: 33yo female with shoulder infection and AKI.  All cx are negative to date.  Tm 100.9, WBC 16.3.  Cr up to 5.17 today with normalized CrCl ~70ml/min.  Zosyn needs adjusted.  Pt is also on Gabapentin  TID.  Consider changing to once daily if pt shows sighns of accumulation (dizziness, fatigue).    Goal of Therapy:  Resolution of infection  Plan:  Change Zosyn to 2.25g IV q8 Monitor renal fxn and adjust as needed. F/U cx results  Marisue Humble, PharmD Clinical Pharmacist [facility]

## 2015-05-03 NOTE — Progress Notes (Signed)
Request for tunneled IJ central catheter for long term antibiotics for right shoulder septic arthritis in patient with AKI, informed consent obtained.  Pattricia Boss PA-C Interventional Radiology  05/03/15  3:35 PM

## 2015-05-03 NOTE — Progress Notes (Signed)
INFECTIOUS DISEASE PROGRESS NOTE  ID: Lindsay [name] is a 34 y.o. 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  C/o shoulder pain.   Abtx:  Anti-infectives    Start     Dose/Rate Route Frequency Ordered Stop   05/03/15 1000  linezolid (ZYVOX) IVPB 600 mg     600 mg 300 mL/hr over 60 Minutes Intravenous Every 12 hours 05/03/15 0752     05/03/15 0800  DAPTOmycin (CUBICIN) 1,000 mg in sodium chloride 0.9 % IVPB  Status:  Discontinued     1,000 mg 240 mL/hr over 30 Minutes Intravenous Every 48 hours 05/02/15 1515 05/03/15 0751   05/01/15 0200  cefTRIAXone (ROCEPHIN) 2 g in dextrose 5 % 50 mL IVPB - Premix  Status:  Discontinued     2 g 100 mL/hr over 30 Minutes Intravenous Every 24 hours 04/30/15 0427 04/30/15 0808   04/30/15 1900  ceFAZolin (ANCEF) 3 g in dextrose 5 % 50 mL IVPB     3 g 160 mL/hr over 30 Minutes Intravenous  Once 04/30/15 1854 04/30/15 1946   04/30/15 1200  vancomycin (VANCOCIN) 1,500 mg in sodium chloride 0.9 % 500 mL IVPB  Status:  Discontinued     1,500 mg 250 mL/hr over 120 Minutes Intravenous Every 8 hours 04/30/15 0436 05/01/15 1350   04/30/15 0830  piperacillin-tazobactam (ZOSYN) IVPB 3.375 g     3.375 g 12.5 mL/hr over 240 Minutes Intravenous 3 times per day 04/30/15 0824     04/30/15 0445  vancomycin (VANCOCIN) 1,500 mg in sodium chloride 0.9 % 500 mL IVPB     1,500 mg 250 mL/hr over 120 Minutes Intravenous  Once 04/30/15 0436 04/30/15 0707   04/30/15 0315  cefTRIAXone (ROCEPHIN) 2 g in dextrose 5 % 50 mL IVPB     2 g 100 mL/hr over 30 Minutes Intravenous  Once 04/30/15 0312 04/30/15 0433   04/30/15 0115  vancomycin (VANCOCIN) IVPB 1000 mg/200 mL premix     1,000 mg 200 mL/hr over 60 Minutes Intravenous  Once 04/30/15 0111  04/30/15 0302      Medications:  Scheduled: . bisacodyl  10 mg Rectal Once  . docusate sodium  100 mg Oral BID  . gabapentin  300 mg Oral TID  . insulin aspart  0-20 Units Subcutaneous TID WC  . insulin aspart  0-5 Units Subcutaneous QHS  . insulin aspart  15 Units Subcutaneous TID WC  . insulin glargine  70 Units Subcutaneous Daily  . linezolid  600 mg Intravenous Q12H  . piperacillin-tazobactam (ZOSYN)  IV  2.25 g Intravenous 3 times per day  . polyethylene glycol  17 g Oral Daily  . [START ON 05/06/2015] Vitamin D (Ergocalciferol)  50,000 Units Oral Q7 days    Objective: Vital signs in last 24 hours: Temp:  [97.8 F (36.6 C)-100.9 F (38.3 C)] 99.1 F (37.3 C) (05/13 1401) Pulse Rate:  [103-112] 112 (05/13 1401) Resp:  [15-18] 18 (05/13 1401) BP: (88-132)/(44-91) 132/91 mmHg (05/13 1401) SpO2:  [90 %-95 %] 95 % (05/13 1401)   General appearance: alert, cooperative and no distress Chest wall: right sided chest wall tenderness, dressed Extremities: R UE dressed.   Lab Results  Recent Labs  05/02/15 1848 05/03/15 0342  WBC 15.6* 16.3*  HGB 9.0* 9.4*  HCT 28.6* 28.6*  NA 124* 127*  K 4.2 4.1  CL 88* 92*  CO2 21* 19*  BUN 48* 53*  CREATININE 4.80* 5.17*   Liver Panel No results for input(s): PROT, ALBUMIN, AST, ALT, ALKPHOS, BILITOT, BILIDIR, IBILI in the last 72 hours. Sedimentation Rate No results for input(s): ESRSEDRATE in the last 72 hours. C-Reactive Protein No results for input(s): CRP in the last 72 hours.  Microbiology: Recent Results (from the past 240 hour(s))  Culture, routine-abscess     Status: None (Preliminary result)   Collection Time: 04/27/15  6:36 AM  Result Value Ref Range Status   Specimen Description ABSCESS RIGHT SHOULDER  Final   Special Requests NONE  Final   Gram Stain   Final    FEW WBC PRESENT,BOTH PMN AND MONONUCLEAR RARE GRAM NEGATIVE RODS Performed at [facility]    Culture   Final    NO GROWTH Note:  CONTINUING TO HOLD FOR 14 DAYS Performed at [facility]    Report Status PENDING  Incomplete  Surgical pcr screen     Status: Abnormal   Collection Time: 04/30/15  3:23 PM  Result Value Ref Range Status   MRSA, PCR NEGATIVE NEGATIVE Final   Staphylococcus aureus POSITIVE (A) NEGATIVE Final    Comment:        The Xpert SA Assay (FDA approved for NASAL specimens in patients over 37 years of age), is one component of a comprehensive surveillance program.  Test performance has been validated by [facility] for patients greater than or equal to 60 year old. It is not intended to diagnose infection nor to guide or monitor treatment.   Anaerobic culture     Status: None (Preliminary result)   Collection Time: 04/30/15  7:41 PM  Result Value Ref Range Status   Specimen Description WOUND RIGHT SHOULDER  Final   Special Requests RIGHT SHOULDER  Final   Gram Stain PENDING  Incomplete   Culture   Final    No Anaerobes Isolated; Culture in Progress for 14 DAYS Performed at [facility]    Report Status PENDING  Incomplete  Culture, routine-abscess     Status: None (Preliminary result)   Collection Time: 04/30/15  7:41 PM  Result Value Ref Range Status   Specimen Description ABSCESS RIGHT SHOULDER  Final   Special Requests PATIENT ON FOLLOWING VANCOMYCIN ZOSYN  Final   Gram Stain   Final    NO WBC SEEN NO SQUAMOUS EPITHELIAL CELLS SEEN NO ORGANISMS SEEN Performed at [facility]    Culture   Final    NO GROWTH 2 DAYS Performed at [facility]    Report Status PENDING  Incomplete  Anaerobic culture     Status: None (Preliminary result)   Collection Time: 04/30/15  7:47 PM  Result Value Ref Range Status   Specimen Description ABSCESS  DRAINAGE FROM RIGHT SHOULDER  Final   Special Requests PATIENT ON FOLLOWING VANCOMYCIN ZOSYN B  Final   Gram Stain PENDING  Incomplete   Culture   Final    NO ANAEROBES ISOLATED; CULTURE IN PROGRESS FOR 5  DAYS Performed at [facility]    Report Status PENDING  Incomplete  Culture, routine-abscess     Status: None (Preliminary result)   Collection Time: 04/30/15  7:47 PM  Result Value Ref Range Status   Specimen Description ABSCESS RIGHT SHOULDER DRAINAGE  Final   Special Requests   Final  PATIENT ON FOLLOWING VANCOMYCIN ZOSYN SPECIMEN B 2 CUPS   Gram Stain   Final    NO WBC SEEN NO SQUAMOUS EPITHELIAL CELLS SEEN NO ORGANISMS SEEN Performed at [facility]    Culture   Final    NO GROWTH 2 DAYS Performed at [facility]    Report Status PENDING  Incomplete  Anaerobic culture     Status: None (Preliminary result)   Collection Time: 04/30/15  7:48 PM  Result Value Ref Range Status   Specimen Description FLUID RIGHT SHOULDER JOINT  Final   Special Requests PATIENT ON FOLLOWING VANCOMYCIN ZOSYN C  Final   Gram Stain PENDING  Incomplete   Culture   Final    No Anaerobes Isolated; Culture in Progress for 14 DAYS Performed at [facility]    Report Status PENDING  Incomplete  Body fluid culture     Status: None (Preliminary result)   Collection Time: 04/30/15  7:48 PM  Result Value Ref Range Status   Specimen Description FLUID RIGHT SHOULDER JOINT  Final   Special Requests PATIENT ON FOLLOWING VANC,ZOSYN  Final   Gram Stain   Final    RARE WBC PRESENT, PREDOMINANTLY PMN NO ORGANISMS SEEN Performed at [facility]    Culture   Final    NO GROWTH 2 DAYS Performed at [facility]    Report Status PENDING  Incomplete    Studies/Results: US Renal  05/02/2015   CLINICAL DATA:  Acute renal failure  EXAM: RENAL / URINARY TRACT ULTRASOUND COMPLETE  COMPARISON:  None.  FINDINGS: Right Kidney:  Length: 13.4 cm. Echogenicity within normal limits. No mass or hydronephrosis visualized.  Left Kidney:  Length: 12.8 cm. Echogenicity within normal limits. No mass or hydronephrosis visualized.  Bladder:  Empty and therefore not well evaluated   IMPRESSION: Study limited by body habitus but appears normal.   Electronically Signed   By: Esperanza Heir M.D.   On: 05/02/2015 16:47     Assessment/Plan: Septic arthritis, myositis  Debrided 5-10 DM2 (since 2004) Acute Renal Failure [place] Obesity (BMI 53.53)  Await Cx (esp 5-7 ? Anaerobe) Continue zosyn/zyvox Watch Cr Appreciate renal f/u.  dapto stopped due to high CK. Not sure if this from her surgery or dapto    Total days of antibiotics: 4 zyvox/zosyn       Johny Sax Infectious Diseases (pager) [phone] [email or web address] 05/03/2015, 2:45 PM  LOS: 3 days

## 2015-05-03 NOTE — Progress Notes (Signed)
     Subjective:  POD#3 ID of R shoulder. Patient reports pain as mild to moderate. ID has been consulted to manage abx.  Culture results still pending.   Objective:   VITALS:   Filed Vitals:   05/02/15 1900 05/02/15 2222 05/03/15 0013 05/03/15 0443  BP: 123/57   112/58  Pulse: 111   103  Temp: 100.5 F (38.1 C) 100.9 F (38.3 C) 100.4 F (38 C) 97.8 F (36.6 C)  TempSrc:      Resp: 15   18  Height:      Weight:      SpO2: 90%   95%    Neurologically intact ABD soft Neurovascular intact Sensation intact distally Intact pulses distally Incision: dressing C/D/I   Lab Results  Component Value Date   WBC 16.3* 05/03/2015   HGB 9.4* 05/03/2015   HCT 28.6* 05/03/2015   MCV 75.1* 05/03/2015   PLT 440* 05/03/2015   BMET    Component Value Date/Time   NA 127* 05/03/2015 0342   K 4.1 05/03/2015 0342   CL 92* 05/03/2015 0342   CO2 19* 05/03/2015 0342   GLUCOSE 138* 05/03/2015 0342   BUN 53* 05/03/2015 0342   CREATININE 5.17* 05/03/2015 0342   CREATININE 0.60 02/13/2015 1607   CALCIUM 7.7* 05/03/2015 0342   GFRNONAA 10* 05/03/2015 0342   GFRNONAA >89 02/13/2015 1607   GFRAA 12* 05/03/2015 0342   GFRAA >89 02/13/2015 1607     Assessment/Plan: 3 Days Post-Op   Principal Problem:   Cellulitis of left upper extremity Active Problems:   Diabetes mellitus due to underlying condition without complications   Obesity   HTN (hypertension)   Hyponatremia   Abscess   Abscess of right shoulder   Up with therapy WBAT in the RUE Sling for comfort ID consulted to manage abx General surgery managing wound and packing Stable from ortho standpoint, will sign off at this time.  Patient will follow up with [place] outpatient.  Please call with any questions/concerns or changes in condition.  Lindsay [name] Hilda Lias 05/03/2015, 12:30 PM Cell [phone]

## 2015-05-03 NOTE — Progress Notes (Signed)
[facility]                                                                              Patient Demographics  Lindsay [name], is a 34 y.o. female, DOB - [birth year], ZOX:[id number]  Admit date - 04/30/2015   Admitting Physician Houston Siren, MD  Outpatient Primary MD for the patient is Doris Cheadle, MD  LOS - 3   Chief Complaint  Patient presents with  . Shoulder Pain       Brief HPI   The patient is a 34 year old female with obesity, asthma, diabetes, hypertension presented to ED with right shoulder worsening pain. Patient was recently seen in the ED and had a small I&D done in ER and was placed on doxycycline. Patient however continued to have increased pain but no subjective fevers or chills. Evaluation in ED included a marked leukocytosis with WBC of 18 K and elevated blood sugar in 400s. Patient was started on IV vancomycin and hospitalist was asked to admit for cellulitis and hyperglycemia.   Assessment & Plan    Principal Problem:   Cellulitis of right shoulder and chest wall with abscess postop day # 3 - MRI of the right shoulder which showed severe muscle edema with small 5 x 6x 25 mm fluid collection overall appearance concerning for myositis due to infectious or inflammatory etiology, large glenohumeral joint effusion, possible septic arthritis - underwent irrigation and debridement of the right shoulder with arthroscopy and patient was also found to have right chest wall subcutaneous abscess and Dr. Derrell Lolling was consulted intraoperatively. Patient underwent I&D of the right chest wall abscess as well. - Creatinine function noticed to be trending up, vancomycin was discontinued. ID consult was obtained, patient seen by Dr. Ninetta Lights on 5/12, recommended to change to Zyvox and Zosyn. - IR to place midline for IV antibiotics  Active Problems:   Diabetes mellitus due to underlying condition :  - Blood sugars controlled, monitor blood sugars closely due to  acute kidney injury worsening.   Acute kidney injury: ? Uremia, feeling lethargic today - Creatinine worsened to 5.1, with metabolic acidosis -Vancomycin was discontinued and renal consult was obtained, renal ultrasound showed no renal obstruction. - Continue IV fluids and bicarbonate drip as recommended by renal service, appreciated medications   Right upper extremity swelling - Per patient since last night, will check Doppler ultrasound to rule out any DVT   Obesity - Patient counseled on diet and weight control    HTN (hypertension) -Currently BP borderline soft, not on any antihypertensive    Hyponatremia: Improving, also some component of pseudohyponatremia due to hyperglycemia -Continue IV fluid hydration  Patient also had some bleeding from the site of heparin shots - Discontinued heparin and aspirin, dressing changes   Code Status: Full code   Family Communication: Discussed in detail with the patient, all imaging results, lab results explained to the patient   Disposition Plan: Not medically ready   Time Spent in minutes   25 minutes  Procedures  Irrigation and debridement of the right shoulder   I&D of the chest wall  Consults   Orthopedics, Dr. Eulah Pont  general surgery, Dr. Derrell Lolling  infectious disease Nephrology  DVT Prophylaxis SCDs   Medications  Scheduled Meds: . bisacodyl  10 mg Rectal Once  . docusate sodium  100 mg Oral BID  . gabapentin  300 mg Oral TID  . insulin aspart  0-20 Units Subcutaneous TID WC  . insulin aspart  0-5 Units Subcutaneous QHS  . insulin aspart  15 Units Subcutaneous TID WC  . insulin glargine  70 Units Subcutaneous Daily  . linezolid  600 mg Intravenous Q12H  . piperacillin-tazobactam (ZOSYN)  IV  3.375 g Intravenous 3 times per day  . polyethylene glycol  17 g Oral Daily  . [START ON 05/06/2015] Vitamin D (Ergocalciferol)  50,000 Units Oral Q7 days   Continuous Infusions: .  sodium bicarbonate 150 mEq in sterile water  1000 mL infusion 125 mL/hr at 05/03/15 1209   PRN Meds:.acetaminophen, HYDROmorphone (DILAUDID) injection, metoCLOPramide **OR** metoCLOPramide (REGLAN) injection, ondansetron **OR** ondansetron (ZOFRAN) IV, oxyCODONE-acetaminophen   Antibiotics   Anti-infectives    Start     Dose/Rate Route Frequency Ordered Stop   05/03/15 1000  linezolid (ZYVOX) IVPB 600 mg     600 mg 300 mL/hr over 60 Minutes Intravenous Every 12 hours 05/03/15 0752     05/03/15 0800  DAPTOmycin (CUBICIN) 1,000 mg in sodium chloride 0.9 % IVPB  Status:  Discontinued     1,000 mg 240 mL/hr over 30 Minutes Intravenous Every 48 hours 05/02/15 1515 05/03/15 0751   05/01/15 0200  cefTRIAXone (ROCEPHIN) 2 g in dextrose 5 % 50 mL IVPB - Premix  Status:  Discontinued     2 g 100 mL/hr over 30 Minutes Intravenous Every 24 hours 04/30/15 0427 04/30/15 0808   04/30/15 1900  ceFAZolin (ANCEF) 3 g in dextrose 5 % 50 mL IVPB     3 g 160 mL/hr over 30 Minutes Intravenous  Once 04/30/15 1854 04/30/15 1946   04/30/15 1200  vancomycin (VANCOCIN) 1,500 mg in sodium chloride 0.9 % 500 mL IVPB  Status:  Discontinued     1,500 mg 250 mL/hr over 120 Minutes Intravenous Every 8 hours 04/30/15 0436 05/01/15 1350   04/30/15 0830  piperacillin-tazobactam (ZOSYN) IVPB 3.375 g     3.375 g 12.5 mL/hr over 240 Minutes Intravenous 3 times per day 04/30/15 0824     04/30/15 0445  vancomycin (VANCOCIN) 1,500 mg in sodium chloride 0.9 % 500 mL IVPB     1,500 mg 250 mL/hr over 120 Minutes Intravenous  Once 04/30/15 0436 04/30/15 0707   04/30/15 0315  cefTRIAXone (ROCEPHIN) 2 g in dextrose 5 % 50 mL IVPB     2 g 100 mL/hr over 30 Minutes Intravenous  Once 04/30/15 0312 04/30/15 0433   04/30/15 0115  vancomycin (VANCOCIN) IVPB 1000 mg/200 mL premix     1,000 mg 200 mL/hr over 60 Minutes Intravenous  Once 04/30/15 0111 04/30/15 0302        Subjective:   Najmo Rigaud was seen and examined today. Patient denies dizziness, chest pain,  shortness of breath, abdominal pain, N/V, new weakness, numbess, tingling.  not feeling too well today, somewhat somnolent, right arm swelling    Objective:   Blood pressure 112/58, pulse 103, temperature 98.1 F (36.7 C), temperature source Oral, resp. rate 18, height 5\' 8"  (1.727 m), weight 159.666 kg (352 lb), last menstrual period 04/23/2015, SpO2 95 %.  Wt Readings from Last 3 Encounters:  04/30/15 159.666 kg (352 lb)  04/03/15 156.945 kg (346 lb)  02/13/15 154.949  kg (341 lb 9.6 oz)     Intake/Output Summary (Last 24 hours) at 05/03/15 1346 Last data filed at 05/03/15 1003  Gross per 24 hour  Intake    240 ml  Output    250 ml  Net    -10 ml    Exam  GeneralIs slightly lethargic today but conversing and appropriately answering all questions , NAD  HEENT:  PERRLA, EOMI  Neck: Supple, no JVD, no masses  CVS: S1 S2 auscultated, RRR  RespiratoryClear to auscultation bilaterally  Abdomen: Soft, nontender, nondistended, + bowel sounds  Ext: no cyanosis clubbing or edema, dressing intact on the right chest and shoulder   Neuro: no new focal neurological deficits  Skin:  dressing intact on the right chest and shoulder  Psych: Normal affect and demeanor,   Data Review   Micro Results Recent Results (from the past 240 hour(s))  Culture, routine-abscess     Status: None (Preliminary result)   Collection Time: 04/27/15  6:36 AM  Result Value Ref Range Status   Specimen Description ABSCESS RIGHT SHOULDER  Final   Special Requests NONE  Final   Gram Stain   Final    FEW WBC PRESENT,BOTH PMN AND MONONUCLEAR RARE GRAM NEGATIVE RODS Performed at [facility]    Culture   Final    NO GROWTH Note: CONTINUING TO HOLD FOR 14 DAYS Performed at [facility]    Report Status PENDING  Incomplete  Surgical pcr screen     Status: Abnormal   Collection Time: 04/30/15  3:23 PM  Result Value Ref Range Status   MRSA, PCR NEGATIVE NEGATIVE Final    Staphylococcus aureus POSITIVE (A) NEGATIVE Final    Comment:        The Xpert SA Assay (FDA approved for NASAL specimens in patients over 49 years of age), is one component of a comprehensive surveillance program.  Test performance has been validated by [facility] for patients greater than or equal to 28 year old. It is not intended to diagnose infection nor to guide or monitor treatment.   Anaerobic culture     Status: None (Preliminary result)   Collection Time: 04/30/15  7:41 PM  Result Value Ref Range Status   Specimen Description WOUND RIGHT SHOULDER  Final   Special Requests RIGHT SHOULDER  Final   Gram Stain PENDING  Incomplete   Culture   Final    No Anaerobes Isolated; Culture in Progress for 14 DAYS Performed at [facility]    Report Status PENDING  Incomplete  Culture, routine-abscess     Status: None (Preliminary result)   Collection Time: 04/30/15  7:41 PM  Result Value Ref Range Status   Specimen Description ABSCESS RIGHT SHOULDER  Final   Special Requests PATIENT ON FOLLOWING VANCOMYCIN ZOSYN  Final   Gram Stain   Final    NO WBC SEEN NO SQUAMOUS EPITHELIAL CELLS SEEN NO ORGANISMS SEEN Performed at [facility]    Culture   Final    NO GROWTH 2 DAYS Performed at [facility]    Report Status PENDING  Incomplete  Anaerobic culture     Status: None (Preliminary result)   Collection Time: 04/30/15  7:47 PM  Result Value Ref Range Status   Specimen Description ABSCESS  DRAINAGE FROM RIGHT SHOULDER  Final   Special Requests PATIENT ON FOLLOWING VANCOMYCIN ZOSYN B  Final   Gram Stain PENDING  Incomplete   Culture   Final  NO ANAEROBES ISOLATED; CULTURE IN PROGRESS FOR 5 DAYS Performed at [facility]    Report Status PENDING  Incomplete  Culture, routine-abscess     Status: None (Preliminary result)   Collection Time: 04/30/15  7:47 PM  Result Value Ref Range Status   Specimen Description ABSCESS RIGHT SHOULDER  DRAINAGE  Final   Special Requests   Final    PATIENT ON FOLLOWING VANCOMYCIN ZOSYN SPECIMEN B 2 CUPS   Gram Stain   Final    NO WBC SEEN NO SQUAMOUS EPITHELIAL CELLS SEEN NO ORGANISMS SEEN Performed at [facility]    Culture   Final    NO GROWTH 2 DAYS Performed at [facility]    Report Status PENDING  Incomplete  Anaerobic culture     Status: None (Preliminary result)   Collection Time: 04/30/15  7:48 PM  Result Value Ref Range Status   Specimen Description FLUID RIGHT SHOULDER JOINT  Final   Special Requests PATIENT ON FOLLOWING VANCOMYCIN ZOSYN C  Final   Gram Stain PENDING  Incomplete   Culture   Final    No Anaerobes Isolated; Culture in Progress for 14 DAYS Performed at [facility]    Report Status PENDING  Incomplete  Body fluid culture     Status: None (Preliminary result)   Collection Time: 04/30/15  7:48 PM  Result Value Ref Range Status   Specimen Description FLUID RIGHT SHOULDER JOINT  Final   Special Requests PATIENT ON FOLLOWING VANC,ZOSYN  Final   Gram Stain   Final    RARE WBC PRESENT, PREDOMINANTLY PMN NO ORGANISMS SEEN Performed at [facility]    Culture   Final    NO GROWTH 2 DAYS Performed at [facility]    Report Status PENDING  Incomplete    Radiology Reports Dg Shoulder Right  04/19/2015   CLINICAL DATA:  Acute onset of right shoulder pain. Heard right shoulder pop 2 days ago. Initial encounter.  EXAM: RIGHT SHOULDER - 2+ VIEW  COMPARISON:  None.  FINDINGS: There is no evidence of fracture or dislocation. The right humeral head is seated within the glenoid fossa. The acromioclavicular joint is unremarkable in appearance. No significant soft tissue abnormalities are seen. The visualized portions of the right lung are clear.  IMPRESSION: No evidence of fracture or dislocation.   Electronically Signed   By: Roanna Raider M.D.   On: 04/19/2015 01:26   Ct Shoulder Right Wo Contrast  04/27/2015   CLINICAL  DATA:  Constant right shoulder pain for 10 days  EXAM: CT OF THE RIGHT SHOULDER WITHOUT CONTRAST  TECHNIQUE: Multidetector CT imaging was performed according to the standard protocol. Multiplanar CT image reconstructions were also generated.  COMPARISON:  Radiographs 04/19/2015  FINDINGS: There is no fracture or dislocation. There is no bone lesion. There is mild osteolysis of the distal clavicle. This can be seen after trauma or with inflammatory arthropathies. It can be idiopathic. No soft tissue mass is evident. There is no abnormal fluid collection. Visible portions of the right ribs and chest wall appear unremarkable.  IMPRESSION: Osteolysis of the distal right clavicle, uncertain chronicity.   Electronically Signed   By: Ellery Plunk M.D.   On: 04/27/2015 03:43   US Renal  05/02/2015   CLINICAL DATA:  Acute renal failure  EXAM: RENAL / URINARY TRACT ULTRASOUND COMPLETE  COMPARISON:  None.  FINDINGS: Right Kidney:  Length: 13.4 cm. Echogenicity within normal limits. No mass or hydronephrosis  visualized.  Left Kidney:  Length: 12.8 cm. Echogenicity within normal limits. No mass or hydronephrosis visualized.  Bladder:  Empty and therefore not well evaluated  IMPRESSION: Study limited by body habitus but appears normal.   Electronically Signed   By: Esperanza Heir M.D.   On: 05/02/2015 16:47   Mr Shoulder Right Wo Contrast  04/30/2015   CLINICAL DATA:  Right shoulder pain. Recent history of antibiotic use. Patient was in too much pain to continue with the contrasted portion of the exam.  EXAM: MRI OF THE RIGHT SHOULDER WITHOUT CONTRAST  TECHNIQUE: Multiplanar, multisequence MR imaging of the shoulder was performed. No intravenous contrast was administered.  COMPARISON:  None.  FINDINGS: Rotator cuff: Mild tendinosis of the supraspinatus and infraspinatus tendon. Teres minor tendon is intact. Subscapularis tendon is intact.  Muscles: There is severe muscle edema in the subscapularis muscle with small 5  x 6 x 25 mm fluid collection. There is severe muscle edema in the anterior and posterior deltoid muscles with a 14 mm fluid collection in the deltoid muscle. There is soft tissue edema superficial to the subscapularis and infraspinatus muscles. There is mild muscle edema along the periphery of the teres minor muscle.  Biceps long head:  Intact.  Acromioclavicular Joint: Mild degenerative change of the acromioclavicular joint. Type II acromion.  Glenohumeral Joint: Large joint effusion.  No chondral defect.  Labrum:  Intact.  Bones: No focal marrow signal abnormality. No fracture or dislocation.  IMPRESSION: 1. Severe muscle edema in the subscapularis muscle with small 5 x 6 x 25 mm fluid collection. There is severe muscle edema in the anterior and posterior deltoid muscles with a 14 mm fluid collection in the deltoid muscle. Soft tissue edema superficial to the subscapularis and infraspinatus muscles. Mild muscle edema along the periphery of the teres minor muscle. The overall appearance is most concerning for myositis which may be secondary to an infectious or inflammatory etiology. The small fluid collections make the process more concerning for an infectious etiology. 2. Large glenohumeral joint effusion. Septic arthritis cannot be excluded.   Electronically Signed   By: Elige Ko   On: 04/30/2015 10:45    CBC  Recent Labs Lab 04/27/15 0244 04/30/15 0123 04/30/15 1610 05/01/15 0531 05/02/15 0517 05/02/15 1848 05/03/15 0342  WBC 13.6* 18.2* 17.1* 18.4* 19.5* 15.6* 16.3*  HGB 11.5* 10.7* 10.2* 9.7* 9.5* 9.0* 9.4*  HCT 34.2* 32.5* 31.3* 30.5* 29.3* 28.6* 28.6*  PLT 377 353 343 370 387 412* 440*  MCV 74.0* 75.1* 74.7* 76.3* 75.9* 76.1* 75.1*  MCH 24.9* 24.7* 24.3* 24.3* 24.6* 23.9* 24.7*  MCHC 33.6 32.9 32.6 31.8 32.4 31.5 32.9  RDW 13.9 14.1 14.2 14.6 15.3 15.0 15.1  LYMPHSABS 1.9 0.0*  --   --   --   --   --   MONOABS 1.1* 0.0*  --   --   --   --   --   EOSABS 0.1 0.0  --   --   --   --    --   BASOSABS 0.0 0.0  --   --   --   --   --     Chemistries   Recent Labs Lab 04/30/15 0123 04/30/15 0627 05/01/15 0531 05/02/15 0517 05/02/15 1848 05/03/15 0342  NA 121* 123* 127* 126* 124* 127*  K 4.2 4.0 4.2 4.9 4.2 4.1  CL 86* 88* 91* 90* 88* 92*  CO2 19* 21* 19*  GLUCOSE 438* 414* 291*  209* 224* 138*  BUN 18 19 28* 43* 48* 53*  CREATININE 1.05* 0.89 2.28* 4.30* 4.80* 5.17*  CALCIUM 8.3* 8.3* 7.9* 7.5* 7.5* 7.7*  AST 19  --   --   --   --   --   ALT 14  --   --   --   --   --   ALKPHOS 170*  --   --   --   --   --   BILITOT 0.7  --   --   --   --   --    ------------------------------------------------------------------------------------------------------------------ estimated creatinine clearance is 25 mL/min (by C-G formula based on Cr of 5.17). ------------------------------------------------------------------------------------------------------------------ No results for input(s): HGBA1C in the last 72 hours. ------------------------------------------------------------------------------------------------------------------ No results for input(s): CHOL, HDL, LDLCALC, TRIG, CHOLHDL, LDLDIRECT in the last 72 hours. ------------------------------------------------------------------------------------------------------------------ No results for input(s): TSH, T4TOTAL, T3FREE, THYROIDAB in the last 72 hours.  Invalid input(s): FREET3 ------------------------------------------------------------------------------------------------------------------ No results for input(s): VITAMINB12, FOLATE, FERRITIN, TIBC, IRON, RETICCTPCT in the last 72 hours.  Coagulation profile No results for input(s): INR, PROTIME in the last 168 hours.  No results for input(s): DDIMER in the last 72 hours.  Cardiac Enzymes No results for input(s): CKMB, TROPONINI, MYOGLOBIN in the last 168 hours.  Invalid input(s):  CK ------------------------------------------------------------------------------------------------------------------ Invalid input(s): POCBNP   Recent Labs  05/02/15 0818 05/02/15 1112 05/02/15 1647 05/02/15 2137 05/03/15 0646 05/03/15 1154  GLUCAP 221* 224* 264* 156* 117* 135*     RAI,RIPUDEEP M.D. [facility] 05/03/2015, 1:46 PM  Pager: [phone]   Between 7am to 7pm - call Pager - [phone]  After 7pm go to [email or web address] - password [email or web address]  Call night coverage person covering after 7pm

## 2015-05-04 DIAGNOSIS — E089 Diabetes mellitus due to underlying condition without complications: Secondary | ICD-10-CM

## 2015-05-04 DIAGNOSIS — L03113 Cellulitis of right upper limb: Secondary | ICD-10-CM

## 2015-05-04 LAB — CULTURE, ROUTINE-ABSCESS
CULTURE: NO GROWTH
CULTURE: NO GROWTH
GRAM STAIN: NONE SEEN
Gram Stain: NONE SEEN

## 2015-05-04 LAB — RENAL FUNCTION PANEL
ALBUMIN: 1.4 g/dL — AB (ref 3.5–5.0)
ANION GAP: 15 (ref 5–15)
BUN: 62 mg/dL — ABNORMAL HIGH (ref 6–20)
CALCIUM: 7.2 mg/dL — AB (ref 8.9–10.3)
CO2: 26 mmol/L (ref 22–32)
Chloride: 86 mmol/L — ABNORMAL LOW (ref 101–111)
Creatinine, Ser: 5.89 mg/dL — ABNORMAL HIGH (ref 0.44–1.00)
GFR calc Af Amer: 10 mL/min — ABNORMAL LOW (ref >60)
GFR, EST NON AFRICAN AMERICAN: 9 mL/min — AB (ref >60)
Glucose, Bld: 148 mg/dL — ABNORMAL HIGH (ref 65–99)
Phosphorus: 6.2 mg/dL — ABNORMAL HIGH (ref 2.5–4.6)
Potassium: 4.1 mmol/L (ref 3.5–5.1)
Sodium: 127 mmol/L — ABNORMAL LOW (ref 135–145)

## 2015-05-04 LAB — BODY FLUID CULTURE: Culture: NO GROWTH

## 2015-05-04 LAB — CBC
HCT: 26.4 % — ABNORMAL LOW (ref 36.0–46.0)
Hemoglobin: 8.4 g/dL — ABNORMAL LOW (ref 12.0–15.0)
MCH: 24.1 pg — ABNORMAL LOW (ref 26.0–34.0)
MCHC: 31.8 g/dL (ref 30.0–36.0)
MCV: 75.6 fL — ABNORMAL LOW (ref 78.0–100.0)
PLATELETS: 358 10*3/uL (ref 150–400)
RBC: 3.49 MIL/uL — ABNORMAL LOW (ref 3.87–5.11)
RDW: 15.1 % (ref 11.5–15.5)
WBC: 12.1 10*3/uL — AB (ref 4.0–10.5)

## 2015-05-04 LAB — GLUCOSE, CAPILLARY
GLUCOSE-CAPILLARY: 131 mg/dL — AB (ref 65–99)
GLUCOSE-CAPILLARY: 189 mg/dL — AB (ref 65–99)
Glucose-Capillary: 111 mg/dL — ABNORMAL HIGH (ref 65–99)
Glucose-Capillary: 108 mg/dL — ABNORMAL HIGH (ref 65–99)

## 2015-05-04 MED ORDER — NYSTATIN 100000 UNIT/GM EX POWD
Freq: Three times a day (TID) | CUTANEOUS | Status: DC
  Administered 2015-05-04 – 2015-05-14 (×19): via TOPICAL
  Filled 2015-05-04 (×2): qty 15

## 2015-05-04 MED ORDER — INSULIN GLARGINE 100 UNIT/ML ~~LOC~~ SOLN
66.0000 [IU] | Freq: Every day | SUBCUTANEOUS | Status: DC
  Administered 2015-05-04: 66 [IU] via SUBCUTANEOUS
  Filled 2015-05-04 (×2): qty 0.66

## 2015-05-04 MED ORDER — SODIUM CHLORIDE 0.9 % IJ SOLN
10.0000 mL | INTRAMUSCULAR | Status: DC | PRN
  Administered 2015-05-05: 10 mL
  Administered 2015-05-06: 20 mL
  Administered 2015-05-06: 10 mL
  Administered 2015-05-07: 20 mL
  Administered 2015-05-07 – 2015-05-13 (×5): 10 mL
  Filled 2015-05-04 (×9): qty 40

## 2015-05-04 MED ORDER — GABAPENTIN 100 MG PO CAPS
100.0000 mg | ORAL_CAPSULE | Freq: Three times a day (TID) | ORAL | Status: DC
  Administered 2015-05-04 – 2015-05-08 (×15): 100 mg via ORAL
  Filled 2015-05-04 (×15): qty 1

## 2015-05-04 MED ORDER — HEPARIN SODIUM (PORCINE) 5000 UNIT/ML IJ SOLN
5000.0000 [IU] | Freq: Three times a day (TID) | INTRAMUSCULAR | Status: DC
  Administered 2015-05-04 – 2015-05-13 (×25): 5000 [IU] via SUBCUTANEOUS
  Filled 2015-05-04 (×26): qty 1

## 2015-05-04 MED ORDER — WHITE PETROLATUM GEL
Status: AC
  Administered 2015-05-04: 17:00:00
  Filled 2015-05-04: qty 1

## 2015-05-04 NOTE — Progress Notes (Signed)
INFECTIOUS DISEASE PROGRESS NOTE  ID: Lindsay [name] is a 34 y.o. 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   Obesity   HTN (hypertension)   Hyponatremia   Abscess   Abscess of right shoulder   ARF (acute renal failure)   Septic arthritis  Subjective: Complains of LE edema  Abtx:  Anti-infectives    Start     Dose/Rate Route Frequency Ordered Stop   05/03/15 2200  piperacillin-tazobactam (ZOSYN) IVPB 2.25 g     2.25 g 100 mL/hr over 30 Minutes Intravenous 3 times per day 05/03/15 1514     05/03/15 1000  linezolid (ZYVOX) IVPB 600 mg     600 mg 300 mL/hr over 60 Minutes Intravenous Every 12 hours 05/03/15 0752     05/03/15 0800  DAPTOmycin (CUBICIN) 1,000 mg in sodium chloride 0.9 % IVPB  Status:  Discontinued     1,000 mg 240 mL/hr over 30 Minutes Intravenous Every 48 hours 05/02/15 1515 05/03/15 0751   05/01/15 0200  cefTRIAXone (ROCEPHIN) 2 g in dextrose 5 % 50 mL IVPB - Premix  Status:  Discontinued     2 g 100 mL/hr over 30 Minutes Intravenous Every 24 hours 04/30/15 0427 04/30/15 0808   04/30/15 1900  ceFAZolin (ANCEF) 3 g in dextrose 5 % 50 mL IVPB     3 g 160 mL/hr over 30 Minutes Intravenous  Once 04/30/15 1854 04/30/15 1946   04/30/15 1200  vancomycin (VANCOCIN) 1,500 mg in sodium chloride 0.9 % 500 mL IVPB  Status:  Discontinued     1,500 mg 250 mL/hr over 120 Minutes Intravenous Every 8 hours 04/30/15 0436 05/01/15 1350   04/30/15 0830  piperacillin-tazobactam (ZOSYN) IVPB 3.375 g  Status:  Discontinued     3.375 g 12.5 mL/hr over 240 Minutes Intravenous 3 times per day 04/30/15 0824 05/03/15 1514   04/30/15 0445  vancomycin (VANCOCIN) 1,500 mg in sodium chloride 0.9 % 500 mL IVPB     1,500 mg 250 mL/hr over 120 Minutes Intravenous  Once 04/30/15 0436 04/30/15 0707   04/30/15 0315  cefTRIAXone (ROCEPHIN) 2 g in dextrose 5 % 50 mL IVPB     2  g 100 mL/hr over 30 Minutes Intravenous  Once 04/30/15 0312 04/30/15 0433   04/30/15 0115  vancomycin (VANCOCIN) IVPB 1000 mg/200 mL premix     1,000 mg 200 mL/hr over 60 Minutes Intravenous  Once 04/30/15 0111 04/30/15 0302      Medications:  Scheduled: . bisacodyl  10 mg Rectal Once  . docusate sodium  100 mg Oral BID  . gabapentin  100 mg Oral TID  . heparin subcutaneous  5,000 Units Subcutaneous 3 times per day  . insulin aspart  0-20 Units Subcutaneous TID WC  . insulin aspart  0-5 Units Subcutaneous QHS  . insulin aspart  15 Units Subcutaneous TID WC  . insulin glargine  66 Units Subcutaneous Daily  . linezolid  600 mg Intravenous Q12H  . nystatin   Topical TID  . piperacillin-tazobactam (ZOSYN)  IV  2.25 g Intravenous 3 times per day  . polyethylene glycol  17 g Oral Daily  . [START ON 05/06/2015] Vitamin D (Ergocalciferol)  50,000 Units Oral Q7 days    Objective: Vital signs in last 24 hours: Temp:  [98.3 F (36.8 C)-99.1 F (37.3 C)] 98.8 F (37.1 C) (05/14 0525) Pulse Rate:  [98-112] 98 (  05/14 0525) Resp:  [18] 18 (05/14 0525) BP: (96-135)/(44-91) 96/44 mmHg (05/14 0525) SpO2:  [92 %-95 %] 92 % (05/14 0525)   General appearance: alert, cooperative and no distress Resp: clear to auscultation bilaterally Cardio: regular rate and rhythm GI: normal findings: bowel sounds normal and soft, non-tender Extremities: r shoulder dressed. BLE edematous.  Lab Results  Recent Labs  05/03/15 0342 05/04/15 0728 05/04/15 1018  WBC 16.3*  --  12.1*  HGB 9.4*  --  8.4*  HCT 28.6*  --  26.4*  NA 127* 127*  --   K 4.1 4.1  --   CL 92* 86*  --   CO2 19* 26  --   BUN 53* 62*  --   CREATININE 5.17* 5.89*  --    Liver Panel  Recent Labs  05/04/15 0728  ALBUMIN 1.4*   Sedimentation Rate No results for input(s): ESRSEDRATE in the last 72 hours. C-Reactive Protein No results for input(s): CRP in the last 72 hours.  Microbiology: Recent Results (from the past 240  hour(s))  Culture, routine-abscess     Status: None (Preliminary result)   Collection Time: 04/27/15  6:36 AM  Result Value Ref Range Status   Specimen Description ABSCESS RIGHT SHOULDER  Final   Special Requests NONE  Final   Gram Stain   Final    FEW WBC PRESENT,BOTH PMN AND MONONUCLEAR RARE GRAM NEGATIVE RODS Performed at [facility]    Culture   Final    NO GROWTH Note: CONTINUING TO HOLD FOR 14 DAYS Performed at [facility]    Report Status PENDING  Incomplete  Surgical pcr screen     Status: Abnormal   Collection Time: 04/30/15  3:23 PM  Result Value Ref Range Status   MRSA, PCR NEGATIVE NEGATIVE Final   Staphylococcus aureus POSITIVE (A) NEGATIVE Final    Comment:        The Xpert SA Assay (FDA approved for NASAL specimens in patients over 59 years of age), is one component of a comprehensive surveillance program.  Test performance has been validated by [facility] for patients greater than or equal to 26 year old. It is not intended to diagnose infection nor to guide or monitor treatment.   Anaerobic culture     Status: None (Preliminary result)   Collection Time: 04/30/15  7:41 PM  Result Value Ref Range Status   Specimen Description WOUND RIGHT SHOULDER  Final   Special Requests RIGHT SHOULDER  Final   Gram Stain PENDING  Incomplete   Culture   Final    No Anaerobes Isolated; Culture in Progress for 14 DAYS Performed at [facility]    Report Status PENDING  Incomplete  Culture, routine-abscess     Status: None   Collection Time: 04/30/15  7:41 PM  Result Value Ref Range Status   Specimen Description ABSCESS RIGHT SHOULDER  Final   Special Requests PATIENT ON FOLLOWING VANCOMYCIN ZOSYN  Final   Gram Stain   Final    NO WBC SEEN NO SQUAMOUS EPITHELIAL CELLS SEEN NO ORGANISMS SEEN Performed at [facility]    Culture   Final    NO GROWTH 3 DAYS Performed at [facility]    Report Status 05/04/2015 FINAL   Final  Anaerobic culture     Status: None (Preliminary result)   Collection Time: 04/30/15  7:47 PM  Result Value Ref Range Status   Specimen Description ABSCESS  DRAINAGE FROM RIGHT SHOULDER  Final   Special Requests PATIENT ON FOLLOWING VANCOMYCIN ZOSYN B  Final   Gram Stain PENDING  Incomplete   Culture   Final    NO ANAEROBES ISOLATED; CULTURE IN PROGRESS FOR 5 DAYS Performed at [facility]    Report Status PENDING  Incomplete  Culture, routine-abscess     Status: None   Collection Time: 04/30/15  7:47 PM  Result Value Ref Range Status   Specimen Description ABSCESS RIGHT SHOULDER DRAINAGE  Final   Special Requests   Final    PATIENT ON FOLLOWING VANCOMYCIN ZOSYN SPECIMEN B 2 CUPS   Gram Stain   Final    NO WBC SEEN NO SQUAMOUS EPITHELIAL CELLS SEEN NO ORGANISMS SEEN Performed at [facility]    Culture   Final    NO GROWTH 3 DAYS Performed at [facility]    Report Status 05/04/2015 FINAL  Final  Anaerobic culture     Status: None (Preliminary result)   Collection Time: 04/30/15  7:48 PM  Result Value Ref Range Status   Specimen Description FLUID RIGHT SHOULDER JOINT  Final   Special Requests PATIENT ON FOLLOWING VANCOMYCIN ZOSYN C  Final   Gram Stain PENDING  Incomplete   Culture   Final    No Anaerobes Isolated; Culture in Progress for 14 DAYS Performed at [facility]    Report Status PENDING  Incomplete  Body fluid culture     Status: None (Preliminary result)   Collection Time: 04/30/15  7:48 PM  Result Value Ref Range Status   Specimen Description FLUID RIGHT SHOULDER JOINT  Final   Special Requests PATIENT ON FOLLOWING VANC,ZOSYN  Final   Gram Stain   Final    RARE WBC PRESENT, PREDOMINANTLY PMN NO ORGANISMS SEEN Performed at [facility]    Culture   Final    NO GROWTH 2 DAYS Performed at [facility]    Report Status PENDING  Incomplete    Studies/Results: US Renal  05/02/2015   CLINICAL DATA:   Acute renal failure  EXAM: RENAL / URINARY TRACT ULTRASOUND COMPLETE  COMPARISON:  None.  FINDINGS: Right Kidney:  Length: 13.4 cm. Echogenicity within normal limits. No mass or hydronephrosis visualized.  Left Kidney:  Length: 12.8 cm. Echogenicity within normal limits. No mass or hydronephrosis visualized.  Bladder:  Empty and therefore not well evaluated  IMPRESSION: Study limited by body habitus but appears normal.   Electronically Signed   By: Esperanza Heir M.D.   On: 05/02/2015 16:47   Ir US Guide Vasc Access Left  05/04/2015   CLINICAL DATA:  Cellulitis  EXAM: TUNNELED LEFT INTERNAL JUGULAR PICC LINE PLACEMENT WITH ULTRASOUND AND FLUOROSCOPIC GUIDANCE  FLUOROSCOPY TIME:  12 seconds.  PROCEDURE: The patient was advised of the possible risks andcomplications and agreed to undergo the procedure. The patient was then brought to the angiographic suite for the procedure.  The left neck was prepped with chlorhexidine, drapedin the usual sterile fashion using maximum barrier technique (cap and mask, sterile gown, sterile gloves, large sterile sheet, hand hygiene and cutaneous antisepsis) and infiltrated locally with 1% Lidocaine.  Ultrasound demonstrated patency of the left jugular vein, and this was documented with an image. Under real-time ultrasound guidance, this vein was accessed with a 21 gauge micropuncture needle and image documentation was performed. A long tract was utilized to create a tunnel. A 0.018 wire was introduced in to the vein. Over this, a 5 [place] single lumen Power tunneled  PICC was advanced to the lower SVC/right atrial junction. The cuff was positioned in the subcutaneous tract. Fluoroscopy during the procedure and fluoro spot radiograph confirms appropriate catheter position. The catheter was flushed and covered with asterile dressing.  Catheter length: 24 cm  COMPLICATIONS: None  IMPRESSION: Successful left jugular tunneled power PICC line placement with ultrasound and fluoroscopic  guidance. The catheter is ready for use.   Electronically Signed   By: Jolaine Click M.D.   On: 05/04/2015 08:07   Ir Fluoro Guide Cv Midline Picc Left  05/04/2015   CLINICAL DATA:  Cellulitis  EXAM: TUNNELED LEFT INTERNAL JUGULAR PICC LINE PLACEMENT WITH ULTRASOUND AND FLUOROSCOPIC GUIDANCE  FLUOROSCOPY TIME:  12 seconds.  PROCEDURE: The patient was advised of the possible risks andcomplications and agreed to undergo the procedure. The patient was then brought to the angiographic suite for the procedure.  The left neck was prepped with chlorhexidine, drapedin the usual sterile fashion using maximum barrier technique (cap and mask, sterile gown, sterile gloves, large sterile sheet, hand hygiene and cutaneous antisepsis) and infiltrated locally with 1% Lidocaine.  Ultrasound demonstrated patency of the left jugular vein, and this was documented with an image. Under real-time ultrasound guidance, this vein was accessed with a 21 gauge micropuncture needle and image documentation was performed. A long tract was utilized to create a tunnel. A 0.018 wire was introduced in to the vein. Over this, a 5 [place] single lumen Power tunneled PICC was advanced to the lower SVC/right atrial junction. The cuff was positioned in the subcutaneous tract. Fluoroscopy during the procedure and fluoro spot radiograph confirms appropriate catheter position. The catheter was flushed and covered with asterile dressing.  Catheter length: 24 cm  COMPLICATIONS: None  IMPRESSION: Successful left jugular tunneled power PICC line placement with ultrasound and fluoroscopic guidance. The catheter is ready for use.   Electronically Signed   By: Jolaine Click M.D.   On: 05/04/2015 08:07     Assessment/Plan: Septic arthritis (R shoulder), chest wall abscess, myositis Debrided 5-10 DM2 (since 2004) Acute Renal Failure [place] Obesity (BMI 53.53) LE edema  Total days of antibiotics: 5 zosyn/zyvox  Her Cx are unrevealing.  Still  awaiting final.  No change in anbx Her Cr continues to rise, appreciate renal f/u.  Encouraged her to keep her LE elevated.  Will defer to PCP         Johny Sax Infectious Diseases (pager) [phone] [email or web address] 05/04/2015, 1:26 PM  LOS: 4 days

## 2015-05-04 NOTE — Progress Notes (Signed)
[facility]                                                                              Patient Demographics  Lindsay [name], is a 34 y.o. female, DOB - January 20, 1981, XLK:[id number]  Admit date - 04/30/2015   Admitting Physician Houston Siren, MD  Outpatient Primary MD for the patient is Doris Cheadle, MD  LOS - 4   Chief Complaint  Patient presents with  . Shoulder Pain       Brief HPI   The patient is a 34 year old female with obesity, asthma, diabetes, hypertension presented to ED with right shoulder worsening pain. Patient was recently seen in the ED and had a small I&D done in ER and was placed on doxycycline. Patient however continued to have increased pain but no subjective fevers or chills. Evaluation in ED included a marked leukocytosis with WBC of 18 K and elevated blood sugar in 400s. Patient was started on IV vancomycin and hospitalist was asked to admit for cellulitis and hyperglycemia.   Assessment & Plan    Principal Problem: Cellulitis of right shoulder and chest wall with abscess  -postop day # 4 - MRI of the right shoulder which showed severe muscle edema with small 5 x 6x 25 mm fluid collection overall appearance concerning for myositis due to infectious or inflammatory etiology, large glenohumeral joint effusion, possible septic arthritis - underwent irrigation and debridement of the right shoulder with arthroscopy and patient was also found to have right chest wall subcutaneous abscess and Dr. Derrell Lolling was consulted intraoperatively. Patient underwent I&D of the right chest wall abscess as well, on 04-30-2015. - Creatinine function noticed to be trending up, vancomycin was discontinued. ID consult was obtained, patient seen by Dr. Ninetta Lights on 5/12, recommended to change to Zyvox and Zosyn. - IR to place midline for IV antibiotics -day 4 of IV antibiotics.  -PT and OT consulted.   Encephalopathy; patient was lethargic yesterday. She appears more  alert today. Suspect multifactorial; metabolic acidosis, renal failure, infectious process.   Will decrease gabapentin to 100 mg TID due to renal failure.   Diabetes mellitus due to underlying condition :  - Blood sugars controlled, monitor blood sugars closely due to acute kidney injury worsening.  -CBG this am at 138. I will decrease lantus to 66 units to avoid hypoglycemia. In setting of renal failure.   Acute kidney injury: - Creatinine pending for this morning.  -Vancomycin was discontinued and renal consult was obtained, renal ultrasound showed no renal obstruction. - Continue IV fluids and bicarbonate drip as recommended by renal service, appreciated medications   Right upper extremity swelling - doppler negative for DVT  Obesity - Patient counseled on diet and weight control  HTN (hypertension) -Currently BP borderline soft, not on any antihypertensive  Hyponatremia: Improving, related to renal failure and pseudohyponatremia from hyperglycemia.  -Continue IV fluid hydration  Patient also had some bleeding from the site of heparin shots - no significant bleeding. Resume heparin.    Code Status: Full code   Family Communication: Discussed in detail with the patient, all imaging results, lab results explained to the patient  Disposition Plan: Not medically ready   Time Spent in minutes   25 minutes  Procedures  Irrigation and debridement of the right shoulder   I&D of the chest wall 5-10  Consults   Orthopedics, Dr. Eulah Pont   general surgery, Dr. Derrell Lolling  infectious disease Nephrology  DVT Prophylaxis SCDs . No significant bleeding, start heparin.   Medications  Scheduled Meds: . bisacodyl  10 mg Rectal Once  . docusate sodium  100 mg Oral BID  . gabapentin  300 mg Oral TID  . insulin aspart  0-20 Units Subcutaneous TID WC  . insulin aspart  0-5 Units Subcutaneous QHS  . insulin aspart  15 Units Subcutaneous TID WC  . insulin glargine  66 Units  Subcutaneous Daily  . linezolid  600 mg Intravenous Q12H  . piperacillin-tazobactam (ZOSYN)  IV  2.25 g Intravenous 3 times per day  . polyethylene glycol  17 g Oral Daily  . [START ON 05/06/2015] Vitamin D (Ergocalciferol)  50,000 Units Oral Q7 days   Continuous Infusions: .  sodium bicarbonate 150 mEq in sterile water 1000 mL infusion 125 mL/hr at 05/04/15 0529   PRN Meds:.acetaminophen, HYDROmorphone (DILAUDID) injection, metoCLOPramide **OR** metoCLOPramide (REGLAN) injection, ondansetron **OR** ondansetron (ZOFRAN) IV, oxyCODONE-acetaminophen   Antibiotics   Anti-infectives    Start     Dose/Rate Route Frequency Ordered Stop   05/03/15 2200  piperacillin-tazobactam (ZOSYN) IVPB 2.25 g     2.25 g 100 mL/hr over 30 Minutes Intravenous 3 times per day 05/03/15 1514     05/03/15 1000  linezolid (ZYVOX) IVPB 600 mg     600 mg 300 mL/hr over 60 Minutes Intravenous Every 12 hours 05/03/15 0752     05/03/15 0800  DAPTOmycin (CUBICIN) 1,000 mg in sodium chloride 0.9 % IVPB  Status:  Discontinued     1,000 mg 240 mL/hr over 30 Minutes Intravenous Every 48 hours 05/02/15 1515 05/03/15 0751   05/01/15 0200  cefTRIAXone (ROCEPHIN) 2 g in dextrose 5 % 50 mL IVPB - Premix  Status:  Discontinued     2 g 100 mL/hr over 30 Minutes Intravenous Every 24 hours 04/30/15 0427 04/30/15 0808   04/30/15 1900  ceFAZolin (ANCEF) 3 g in dextrose 5 % 50 mL IVPB     3 g 160 mL/hr over 30 Minutes Intravenous  Once 04/30/15 1854 04/30/15 1946   04/30/15 1200  vancomycin (VANCOCIN) 1,500 mg in sodium chloride 0.9 % 500 mL IVPB  Status:  Discontinued     1,500 mg 250 mL/hr over 120 Minutes Intravenous Every 8 hours 04/30/15 0436 05/01/15 1350   04/30/15 0830  piperacillin-tazobactam (ZOSYN) IVPB 3.375 g  Status:  Discontinued     3.375 g 12.5 mL/hr over 240 Minutes Intravenous 3 times per day 04/30/15 0824 05/03/15 1514   04/30/15 0445  vancomycin (VANCOCIN) 1,500 mg in sodium chloride 0.9 % 500 mL IVPB      1,500 mg 250 mL/hr over 120 Minutes Intravenous  Once 04/30/15 0436 04/30/15 0707   04/30/15 0315  cefTRIAXone (ROCEPHIN) 2 g in dextrose 5 % 50 mL IVPB     2 g 100 mL/hr over 30 Minutes Intravenous  Once 04/30/15 0312 04/30/15 0433   04/30/15 0115  vancomycin (VANCOCIN) IVPB 1000 mg/200 mL premix     1,000 mg 200 mL/hr over 60 Minutes Intravenous  Once 04/30/15 0111 04/30/15 0302        Subjective:   Corena Berghuis was seen and examined today. complaining of swelling of Bilateral  LE, and pain.  Also complaining of pain right shoulder.    Objective:   Blood pressure 96/44, pulse 98, temperature 98.8 F (37.1 C), temperature source Oral, resp. rate 18, height 5\' 8"  (1.727 m), weight 159.666 kg (352 lb), last menstrual period 04/23/2015, SpO2 92 %.  Wt Readings from Last 3 Encounters:  04/30/15 159.666 kg (352 lb)  04/03/15 156.945 kg (346 lb)  02/13/15 154.949 kg (341 lb 9.6 oz)     Intake/Output Summary (Last 24 hours) at 05/04/15 0728 Last data filed at 05/04/15 0255  Gross per 24 hour  Intake 4134.17 ml  Output    500 ml  Net 3634.17 ml    Exam  GeneralIs  NAD, alert, sitting up in the bed..  Neck: Supple, no JVD, no masses  CVS: S1 S2 auscultated, RRR  RespiratoryClear to auscultation bilaterally  Abdomen: Soft, nontender, nondistended, + bowel sounds, fungal infection, abdominal skin folds.   Ext: no cyanosis clubbing or edema, dressing intact on the right chest and shoulder . LE with plus 2 edema.   Skin:  dressing intact on the right chest and shoulder    Data Review   Micro Results Recent Results (from the past 240 hour(s))  Culture, routine-abscess     Status: None (Preliminary result)   Collection Time: 04/27/15  6:36 AM  Result Value Ref Range Status   Specimen Description ABSCESS RIGHT SHOULDER  Final   Special Requests NONE  Final   Gram Stain   Final    FEW WBC PRESENT,BOTH PMN AND MONONUCLEAR RARE GRAM NEGATIVE RODS Performed at  [facility]    Culture   Final    NO GROWTH Note: CONTINUING TO HOLD FOR 14 DAYS Performed at [facility]    Report Status PENDING  Incomplete  Surgical pcr screen     Status: Abnormal   Collection Time: 04/30/15  3:23 PM  Result Value Ref Range Status   MRSA, PCR NEGATIVE NEGATIVE Final   Staphylococcus aureus POSITIVE (A) NEGATIVE Final    Comment:        The Xpert SA Assay (FDA approved for NASAL specimens in patients over 34 years of age), is one component of a comprehensive surveillance program.  Test performance has been validated by [facility] for patients greater than or equal to 80 year old. It is not intended to diagnose infection nor to guide or monitor treatment.   Anaerobic culture     Status: None (Preliminary result)   Collection Time: 04/30/15  7:41 PM  Result Value Ref Range Status   Specimen Description WOUND RIGHT SHOULDER  Final   Special Requests RIGHT SHOULDER  Final   Gram Stain PENDING  Incomplete   Culture   Final    No Anaerobes Isolated; Culture in Progress for 14 DAYS Performed at [facility]    Report Status PENDING  Incomplete  Culture, routine-abscess     Status: None (Preliminary result)   Collection Time: 04/30/15  7:41 PM  Result Value Ref Range Status   Specimen Description ABSCESS RIGHT SHOULDER  Final   Special Requests PATIENT ON FOLLOWING VANCOMYCIN ZOSYN  Final   Gram Stain   Final    NO WBC SEEN NO SQUAMOUS EPITHELIAL CELLS SEEN NO ORGANISMS SEEN Performed at [facility]    Culture   Final    NO GROWTH 2 DAYS Performed at [facility]    Report Status PENDING  Incomplete  Anaerobic culture  Status: None (Preliminary result)   Collection Time: 04/30/15  7:47 PM  Result Value Ref Range Status   Specimen Description ABSCESS  DRAINAGE FROM RIGHT SHOULDER  Final   Special Requests PATIENT ON FOLLOWING VANCOMYCIN ZOSYN B  Final   Gram Stain PENDING  Incomplete   Culture    Final    NO ANAEROBES ISOLATED; CULTURE IN PROGRESS FOR 5 DAYS Performed at [facility]    Report Status PENDING  Incomplete  Culture, routine-abscess     Status: None (Preliminary result)   Collection Time: 04/30/15  7:47 PM  Result Value Ref Range Status   Specimen Description ABSCESS RIGHT SHOULDER DRAINAGE  Final   Special Requests   Final    PATIENT ON FOLLOWING VANCOMYCIN ZOSYN SPECIMEN B 2 CUPS   Gram Stain   Final    NO WBC SEEN NO SQUAMOUS EPITHELIAL CELLS SEEN NO ORGANISMS SEEN Performed at [facility]    Culture   Final    NO GROWTH 2 DAYS Performed at [facility]    Report Status PENDING  Incomplete  Anaerobic culture     Status: None (Preliminary result)   Collection Time: 04/30/15  7:48 PM  Result Value Ref Range Status   Specimen Description FLUID RIGHT SHOULDER JOINT  Final   Special Requests PATIENT ON FOLLOWING VANCOMYCIN ZOSYN C  Final   Gram Stain PENDING  Incomplete   Culture   Final    No Anaerobes Isolated; Culture in Progress for 14 DAYS Performed at [facility]    Report Status PENDING  Incomplete  Body fluid culture     Status: None (Preliminary result)   Collection Time: 04/30/15  7:48 PM  Result Value Ref Range Status   Specimen Description FLUID RIGHT SHOULDER JOINT  Final   Special Requests PATIENT ON FOLLOWING VANC,ZOSYN  Final   Gram Stain   Final    RARE WBC PRESENT, PREDOMINANTLY PMN NO ORGANISMS SEEN Performed at [facility]    Culture   Final    NO GROWTH 2 DAYS Performed at [facility]    Report Status PENDING  Incomplete    Radiology Reports Dg Shoulder Right  04/19/2015   CLINICAL DATA:  Acute onset of right shoulder pain. Heard right shoulder pop 2 days ago. Initial encounter.  EXAM: RIGHT SHOULDER - 2+ VIEW  COMPARISON:  None.  FINDINGS: There is no evidence of fracture or dislocation. The right humeral head is seated within the glenoid fossa. The acromioclavicular joint  is unremarkable in appearance. No significant soft tissue abnormalities are seen. The visualized portions of the right lung are clear.  IMPRESSION: No evidence of fracture or dislocation.   Electronically Signed   By: Roanna Raider M.D.   On: 04/19/2015 01:26   Ct Shoulder Right Wo Contrast  04/27/2015   CLINICAL DATA:  Constant right shoulder pain for 10 days  EXAM: CT OF THE RIGHT SHOULDER WITHOUT CONTRAST  TECHNIQUE: Multidetector CT imaging was performed according to the standard protocol. Multiplanar CT image reconstructions were also generated.  COMPARISON:  Radiographs 04/19/2015  FINDINGS: There is no fracture or dislocation. There is no bone lesion. There is mild osteolysis of the distal clavicle. This can be seen after trauma or with inflammatory arthropathies. It can be idiopathic. No soft tissue mass is evident. There is no abnormal fluid collection. Visible portions of the right ribs and chest wall appear unremarkable.  IMPRESSION: Osteolysis of the distal right clavicle, uncertain chronicity.  Electronically Signed   By: Ellery Plunk M.D.   On: 04/27/2015 03:43   US Renal  05/02/2015   CLINICAL DATA:  Acute renal failure  EXAM: RENAL / URINARY TRACT ULTRASOUND COMPLETE  COMPARISON:  None.  FINDINGS: Right Kidney:  Length: 13.4 cm. Echogenicity within normal limits. No mass or hydronephrosis visualized.  Left Kidney:  Length: 12.8 cm. Echogenicity within normal limits. No mass or hydronephrosis visualized.  Bladder:  Empty and therefore not well evaluated  IMPRESSION: Study limited by body habitus but appears normal.   Electronically Signed   By: Esperanza Heir M.D.   On: 05/02/2015 16:47   Mr Shoulder Right Wo Contrast  04/30/2015   CLINICAL DATA:  Right shoulder pain. Recent history of antibiotic use. Patient was in too much pain to continue with the contrasted portion of the exam.  EXAM: MRI OF THE RIGHT SHOULDER WITHOUT CONTRAST  TECHNIQUE: Multiplanar, multisequence MR imaging of  the shoulder was performed. No intravenous contrast was administered.  COMPARISON:  None.  FINDINGS: Rotator cuff: Mild tendinosis of the supraspinatus and infraspinatus tendon. Teres minor tendon is intact. Subscapularis tendon is intact.  Muscles: There is severe muscle edema in the subscapularis muscle with small 5 x 6 x 25 mm fluid collection. There is severe muscle edema in the anterior and posterior deltoid muscles with a 14 mm fluid collection in the deltoid muscle. There is soft tissue edema superficial to the subscapularis and infraspinatus muscles. There is mild muscle edema along the periphery of the teres minor muscle.  Biceps long head:  Intact.  Acromioclavicular Joint: Mild degenerative change of the acromioclavicular joint. Type II acromion.  Glenohumeral Joint: Large joint effusion.  No chondral defect.  Labrum:  Intact.  Bones: No focal marrow signal abnormality. No fracture or dislocation.  IMPRESSION: 1. Severe muscle edema in the subscapularis muscle with small 5 x 6 x 25 mm fluid collection. There is severe muscle edema in the anterior and posterior deltoid muscles with a 14 mm fluid collection in the deltoid muscle. Soft tissue edema superficial to the subscapularis and infraspinatus muscles. Mild muscle edema along the periphery of the teres minor muscle. The overall appearance is most concerning for myositis which may be secondary to an infectious or inflammatory etiology. The small fluid collections make the process more concerning for an infectious etiology. 2. Large glenohumeral joint effusion. Septic arthritis cannot be excluded.   Electronically Signed   By: Elige Ko   On: 04/30/2015 10:45    CBC  Recent Labs Lab 04/30/15 0123 04/30/15 4401 05/01/15 0531 05/02/15 0517 05/02/15 1848 05/03/15 0342  WBC 18.2* 17.1* 18.4* 19.5* 15.6* 16.3*  HGB 10.7* 10.2* 9.7* 9.5* 9.0* 9.4*  HCT 32.5* 31.3* 30.5* 29.3* 28.6* 28.6*  PLT 353 343 370 387 412* 440*  MCV 75.1* 74.7* 76.3*  75.9* 76.1* 75.1*  MCH 24.7* 24.3* 24.3* 24.6* 23.9* 24.7*  MCHC 32.9 32.6 31.8 32.4 31.5 32.9  RDW 14.1 14.2 14.6 15.3 15.0 15.1  LYMPHSABS 0.0*  --   --   --   --   --   MONOABS 0.0*  --   --   --   --   --   EOSABS 0.0  --   --   --   --   --   BASOSABS 0.0  --   --   --   --   --     Chemistries   Recent Labs Lab 04/30/15 0123 04/30/15 0627 05/01/15 0531 05/02/15  4098 05/02/15 1848 05/03/15 0342  NA 121* 123* 127* 126* 124* 127*  K 4.2 4.0 4.2 4.9 4.2 4.1  CL 86* 88* 91* 90* 88* 92*  CO2 23 23 22  19* 21* 19*  GLUCOSE 438* 414* 291* 209* 224* 138*  BUN 18 19 28* 43* 48* 53*  CREATININE 1.05* 0.89 2.28* 4.30* 4.80* 5.17*  CALCIUM 8.3* 8.3* 7.9* 7.5* 7.5* 7.7*  AST 19  --   --   --   --   --   ALT 14  --   --   --   --   --   ALKPHOS 170*  --   --   --   --   --   BILITOT 0.7  --   --   --   --   --    ------------------------------------------------------------------------------------------------------------------ estimated creatinine clearance is 25 mL/min (by C-G formula based on Cr of 5.17). ------------------------------------------------------------------------------------------------------------------ No results for input(s): HGBA1C in the last 72 hours. ------------------------------------------------------------------------------------------------------------------ No results for input(s): CHOL, HDL, LDLCALC, TRIG, CHOLHDL, LDLDIRECT in the last 72 hours. ------------------------------------------------------------------------------------------------------------------ No results for input(s): TSH, T4TOTAL, T3FREE, THYROIDAB in the last 72 hours.  Invalid input(s): FREET3 ------------------------------------------------------------------------------------------------------------------ No results for input(s): VITAMINB12, FOLATE, FERRITIN, TIBC, IRON, RETICCTPCT in the last 72 hours.  Coagulation profile No results for input(s): INR, PROTIME in the last 168  hours.  No results for input(s): DDIMER in the last 72 hours.  Cardiac Enzymes No results for input(s): CKMB, TROPONINI, MYOGLOBIN in the last 168 hours.  Invalid input(s): CK ------------------------------------------------------------------------------------------------------------------ Invalid input(s): POCBNP   Recent Labs  05/02/15 2137 05/03/15 0646 05/03/15 1154 05/03/15 1638 05/03/15 2122 05/04/15 0635  GLUCAP 156* 117* 135* 176* 181* 131*     Avir Deruiter A M.D. [facility] 05/04/2015, 7:28 AM  Pager: [phone]  Between 7am to 7pm - call Pager - [phone]  After 7pm go to [email or web address] - password [email or web address]  Call night coverage person covering after 7pm

## 2015-05-04 NOTE — Progress Notes (Signed)
Subjective:  Somewhat lethargic, with pain in right shoulder and chest wall worse with deep breaths." My legs look like two elephants" Objective Vital signs in last 24 hours: Filed Vitals:   05/03/15 1325 05/03/15 1401 05/03/15 1940 05/04/15 0525  BP:  132/91 135/76 96/44  Pulse:  112 109 98  Temp: 98.1 F (36.7 C) 99.1 F (37.3 C) 98.3 F (36.8 C) 98.8 F (37.1 C)  TempSrc:  Oral Oral Oral  Resp:  Height:      Weight:      SpO2:  95% 95% 92%   Weight change:   Intake/Output Summary (Last 24 hours) at 05/04/15 1024 Last data filed at 05/04/15 0759  Gross per 24 hour  Intake 4374.17 ml  Output    525 ml  Net 38416.77 ml   34 year old with HTN/DM/obesity/asthma presenting with septic arthritis of shoulder s/p surgical management on 5/10- now with complicating AKI with baseline normal renal function   Assessment/Plan: 1. Acute renal failure: Currently oliguric, although UOP does seem to be picking up. The current history, timeline of events and available database point towards ATN likely from relative hypotension/SIRS related to infection and surgery. Random vancomycin levels are low and unlikely to be the culprit for her ATN. Urine sediment does not point to an acute GN and urine electrolytes are pending. Renal ultrasound is normal. - Continue strict intake/output monitor with foley.  - Recommend daily labs and avoiding nephrotoxins including iodinated intravenous contrast and nonsteroidal anti-inflammatory drugs. Still no acute indications for dialysis. Rate of rise is not bad so hopefully have some time for this to turn around.  I told the patient because she wanted to know the worse case scenario which would be HD but feel like we have another 2-3 days to wait 2. Hyponatremia: This is in part pseudohyponatremia from concomitant hyperglycemia but also true hyponatremia from acute renal failure/defective free water handling. Will stop IVF as feel tank is full 3. Anion gap  metabolic acidosis: Secondary to acute renal failure; acidosis resolved, gap closing with sodium bicarbonate, treatment of hyperglycemia. - better- will stop bicarb 4. Right septic joint: Antibiotic therapy currently broad-spectrum with Zyvox and Zosyn per ID. No growth/suspicious organisms and cultures yet.   Labs:  FENa: 0.22%  Basic Metabolic Panel:  Recent Labs Lab 05/02/15 1848 05/03/15 0342 05/04/15 0728  NA 124* 127* 127*  K 4.2 4.1 4.1  CL 88* 92* 86*  CO2 21* 19* 26  GLUCOSE 224* 138* 148*  BUN 48* 53* 62*  CREATININE 4.80* 5.17* 5.89*  CALCIUM 7.5* 7.7* 7.2*  PHOS  --   --  6.2*   Liver Function Tests:  Recent Labs Lab 04/30/15 0123 05/04/15 0728  AST 19  --   ALT 14  --   ALKPHOS 170*  --   BILITOT 0.7  --   PROT 7.4  --   ALBUMIN 1.9* 1.4*   CBC:  Recent Labs Lab 04/30/15 0123 04/30/15 0627 05/01/15 0531 05/02/15 0517 05/02/15 1848 05/03/15 0342  WBC 18.2* 17.1* 18.4* 19.5* 15.6* 16.3*  NEUTROABS 0.0*  --   --   --   --   --   HGB 10.7* 10.2* 9.7* 9.5* 9.0* 9.4*  HCT 32.5* 31.3* 30.5* 29.3* 28.6* 28.6*  MCV 75.1* 74.7* 76.3* 75.9* 76.1* 75.1*  PLT 353 343 370 387 412* 440*   Cardiac Enzymes:  Recent Labs Lab 05/02/15 1853  CKTOTAL 2196*   CBG:  Recent Labs Lab 05/03/15  6950 05/03/15 1154 05/03/15 1638 05/03/15 2122 05/04/15 0635  GLUCAP 117* 135* 176* 181* 131*    Iron Studies: No results for input(s): IRON, TIBC, TRANSFERRIN, FERRITIN in the last 72 hours. Studies/Results: US Renal  05/02/2015   CLINICAL DATA:  Acute renal failure  EXAM: RENAL / URINARY TRACT ULTRASOUND COMPLETE  COMPARISON:  None.  FINDINGS: Right Kidney:  Length: 13.4 cm. Echogenicity within normal limits. No mass or hydronephrosis visualized.  Left Kidney:  Length: 12.8 cm. Echogenicity within normal limits. No mass or hydronephrosis visualized.  Bladder:  Empty and therefore not well evaluated  IMPRESSION: Study limited by body habitus but appears normal.    Electronically Signed   By: Esperanza Heir M.D.   On: 05/02/2015 16:47   Ir US Guide Vasc Access Left  05/04/2015   CLINICAL DATA:  Cellulitis  EXAM: TUNNELED LEFT INTERNAL JUGULAR PICC LINE PLACEMENT WITH ULTRASOUND AND FLUOROSCOPIC GUIDANCE  FLUOROSCOPY TIME:  12 seconds.  PROCEDURE: The patient was advised of the possible risks andcomplications and agreed to undergo the procedure. The patient was then brought to the angiographic suite for the procedure.  The left neck was prepped with chlorhexidine, drapedin the usual sterile fashion using maximum barrier technique (cap and mask, sterile gown, sterile gloves, large sterile sheet, hand hygiene and cutaneous antisepsis) and infiltrated locally with 1% Lidocaine.  Ultrasound demonstrated patency of the left jugular vein, and this was documented with an image. Under real-time ultrasound guidance, this vein was accessed with a 21 gauge micropuncture needle and image documentation was performed. A long tract was utilized to create a tunnel. A 0.018 wire was introduced in to the vein. Over this, a 5 [place] single lumen Power tunneled PICC was advanced to the lower SVC/right atrial junction. The cuff was positioned in the subcutaneous tract. Fluoroscopy during the procedure and fluoro spot radiograph confirms appropriate catheter position. The catheter was flushed and covered with asterile dressing.  Catheter length: 24 cm  COMPLICATIONS: None  IMPRESSION: Successful left jugular tunneled power PICC line placement with ultrasound and fluoroscopic guidance. The catheter is ready for use.   Electronically Signed   By: Jolaine Click M.D.   On: 05/04/2015 08:07   Ir Fluoro Guide Cv Midline Picc Left  05/04/2015   CLINICAL DATA:  Cellulitis  EXAM: TUNNELED LEFT INTERNAL JUGULAR PICC LINE PLACEMENT WITH ULTRASOUND AND FLUOROSCOPIC GUIDANCE  FLUOROSCOPY TIME:  12 seconds.  PROCEDURE: The patient was advised of the possible risks andcomplications and agreed to undergo the  procedure. The patient was then brought to the angiographic suite for the procedure.  The left neck was prepped with chlorhexidine, drapedin the usual sterile fashion using maximum barrier technique (cap and mask, sterile gown, sterile gloves, large sterile sheet, hand hygiene and cutaneous antisepsis) and infiltrated locally with 1% Lidocaine.  Ultrasound demonstrated patency of the left jugular vein, and this was documented with an image. Under real-time ultrasound guidance, this vein was accessed with a 21 gauge micropuncture needle and image documentation was performed. A long tract was utilized to create a tunnel. A 0.018 wire was introduced in to the vein. Over this, a 5 [place] single lumen Power tunneled PICC was advanced to the lower SVC/right atrial junction. The cuff was positioned in the subcutaneous tract. Fluoroscopy during the procedure and fluoro spot radiograph confirms appropriate catheter position. The catheter was flushed and covered with asterile dressing.  Catheter length: 24 cm  COMPLICATIONS: None  IMPRESSION: Successful left jugular tunneled power PICC line placement with  ultrasound and fluoroscopic guidance. The catheter is ready for use.   Electronically Signed   By: Jolaine Click M.D.   On: 05/04/2015 08:07   Medications: Infusions: .  sodium bicarbonate 150 mEq in sterile water 1000 mL infusion 125 mL/hr at 05/04/15 0529    Scheduled Medications: . bisacodyl  10 mg Rectal Once  . docusate sodium  100 mg Oral BID  . gabapentin  100 mg Oral TID  . heparin subcutaneous  5,000 Units Subcutaneous 3 times per day  . insulin aspart  0-20 Units Subcutaneous TID WC  . insulin aspart  0-5 Units Subcutaneous QHS  . insulin aspart  15 Units Subcutaneous TID WC  . insulin glargine  66 Units Subcutaneous Daily  . linezolid  600 mg Intravenous Q12H  . nystatin   Topical TID  . piperacillin-tazobactam (ZOSYN)  IV  2.25 g Intravenous 3 times per day  . polyethylene glycol  17 g Oral  Daily  . [START ON 05/06/2015] Vitamin D (Ergocalciferol)  50,000 Units Oral Q7 days   I have reviewed scheduled and prn medications.  Physical Exam: General: NAD Heart: Regular rate (90's), no murmur or rub Lungs: Nonlabored, not taking deep breaths. Distant, clear breath sounds.  Abdomen: soft non tender Extremities: obese extremities- worse edema Access: Left IJ PICC  Ryan B. Jarvis Newcomer, MD, PGY-2 05/04/2015 10:24 AM  LOS: 4 days    Patient seen and examined, agree with above note with above modifications. No new complaints.  UOP stil poor but seems like is picking up.  There are no indications for HD- because is starting to retain volume and bicarb is better will stop IVF  Annie Sable, MD 05/04/2015

## 2015-05-04 NOTE — Evaluation (Signed)
Physical Therapy Evaluation Patient Details Name: Lindsay [name] MRN: [id number] DOB: September 22, 1981 Today's Date: 05/04/2015   History of Present Illness  Pt is s/p I&D of the right shoulder with arthroscopy and I&D of the right chest wall abscess as well, on 04-30-2015. PMH includes: obesity, asthma, diabetes, hypertension, acute renal failure, septic arthritis.   Clinical Impression  Pt admitted with above diagnosis. Pt currently with functional limitations due to the deficits listed below (see PT Problem List). +2 assist required on eval for safety/equipment. Pt needed min assist for bed mobility, transfers, and gait. Gait distance limited to 25 feet due to pain and fatigue.  Pt will benefit from skilled PT to increase their independence and safety with mobility to allow discharge to the venue listed below.  If pt does not have assist at home, short-term SNF may need to be considered.     Follow Up Recommendations Home health PT;Supervision/Assistance - 24 hour    Equipment Recommendations  Rolling walker with 5" wheels    Recommendations for Other Services       Precautions / Restrictions Precautions Precautions: Fall Required Braces or Orthoses: Sling (RUE for comfort) Restrictions Weight Bearing Restrictions: Yes RUE Weight Bearing: Weight bearing as tolerated Other Position/Activity Restrictions: ROM as tolerated      Mobility  Bed Mobility Overal bed mobility: Needs Assistance Bed Mobility: Sit to Supine       Sit to supine: Min assist   General bed mobility comments: assist with BLE  Transfers Overall transfer level: Needs assistance Equipment used: None Transfers: Sit to/from Stand Sit to Stand: Min assist;+2 physical assistance         General transfer comment: multi attempts needed  Ambulation/Gait Ambulation/Gait assistance: Min assist;+2 safety/equipment Ambulation Distance (Feet): 25 Feet Assistive device: None Gait Pattern/deviations:  Step-through pattern;Decreased stride length;Wide base of support Gait velocity: very slow Gait velocity interpretation: Below normal speed for age/gender General Gait Details: Pt using IV pole for support  Stairs            Wheelchair Mobility    Modified Rankin (Stroke Patients Only)       Balance Overall balance assessment: Needs assistance Sitting-balance support: Feet supported;Single extremity supported Sitting balance-Leahy Scale: Good     Standing balance support: Single extremity supported;During functional activity Standing balance-Leahy Scale: Fair                               Pertinent Vitals/Pain Pain Assessment: Faces Faces Pain Scale: Hurts even more Pain Location: R shoulder Pain Descriptors / Indicators: Grimacing;Operative site guarding Pain Intervention(s): Monitored during session;Limited activity within patient's tolerance;Repositioned    Home Living Family/patient expects to be discharged to:: Private residence Living Arrangements: Children;Other (Comment) (7 y.o. daughter currently staying with a friend.)   Type of Home: Apartment Home Access: Stairs to enter   Secretary/administrator of Steps: 2 Home Layout: One level Home Equipment: None Additional Comments: Pt reports "I don't really have anyone to help me at home. Everybody I know is old."    Prior Function Level of Independence: Independent               Hand Dominance   Dominant Hand: Right    Extremity/Trunk Assessment   Upper Extremity Assessment: Defer to OT evaluation RUE Deficits / Details: s/p RUE I&D with arthroscopy. Pt with 3-/5 strength at elbow level. Weak grasp.          Lower  Extremity Assessment: Generalized weakness         Communication   Communication: No difficulties  Cognition Arousal/Alertness: Awake/alert Behavior During Therapy: Flat affect Overall Cognitive Status: Within Functional Limits for tasks assessed                       General Comments General comments (skin integrity, edema, etc.): edema noted in rt hand, educated on edema control     Exercises        Assessment/Plan    PT Assessment Patient needs continued PT services  PT Diagnosis Difficulty walking;Acute pain;Generalized weakness   PT Problem List Decreased strength;Decreased range of motion;Decreased activity tolerance;Decreased balance;Decreased mobility;Pain;Decreased knowledge of use of DME  PT Treatment Interventions DME instruction;Gait training;Stair training;Functional mobility training;Therapeutic activities;Therapeutic exercise;Patient/family education;Balance training   PT Goals (Current goals can be found in the Care Plan section) Acute Rehab PT Goals Patient Stated Goal: not stated PT Goal Formulation: With patient Time For Goal Achievement: 05/18/15 Potential to Achieve Goals: Good    Frequency Min 3X/week   Barriers to discharge Decreased caregiver support      Co-evaluation PT/OT/SLP Co-Evaluation/Treatment: Yes Reason for Co-Treatment: For patient/therapist safety PT goals addressed during session: Mobility/safety with mobility;Balance OT goals addressed during session: ADL's and self-care       End of Session Equipment Utilized During Treatment: Other (comment) (RUE sling) Activity Tolerance: Patient limited by fatigue;Patient limited by pain Patient left: in bed;with call bell/phone within reach Nurse Communication: Mobility status         Time: 1093-2355 PT Time Calculation (min) (ACUTE ONLY): 40 min   Charges:   PT Evaluation $Initial PT Evaluation Tier I: 1 Procedure PT Treatments $Gait Training: 8-22 mins   PT G Codes:        Ilda Foil 05/04/2015, 4:11 PM

## 2015-05-04 NOTE — Progress Notes (Signed)
[facility] Progress Note  4 Days Post-Op  Subjective: Still with moderate pain with dressing changes  Objective: Vital signs in last 24 hours: Temp:  [98.1 F (36.7 C)-99.1 F (37.3 C)] 98.8 F (37.1 C) (05/14 0525) Pulse Rate:  [98-112] 98 (05/14 0525) Resp:  [18] 18 (05/14 0525) BP: (96-135)/(44-91) 96/44 mmHg (05/14 0525) SpO2:  [92 %-95 %] 92 % (05/14 0525) Last BM Date: 05/03/15  Intake/Output from previous day: 05/13 0701 - 05/14 0700 In: 4134.2 [P.O.:480; I.V.:3654.2] Out: 500 [Urine:500] Intake/Output this shift: Total I/O In: 240 [P.O.:240] Out: 125 [Urine:125]  PE: Chest wound clean  Lab Results:   Recent Labs  05/03/15 0342 05/04/15 1018  WBC 16.3* 12.1*  HGB 9.4* 8.4*  HCT 28.6* 26.4*  PLT 440* 358   BMET  Recent Labs  05/03/15 0342 05/04/15 0728  NA 127* 127*  K 4.1 4.1  CL 92* 86*  CO2 19* 26  GLUCOSE 138* 148*  BUN 53* 62*  CREATININE 5.17* 5.89*  CALCIUM 7.7* 7.2*   PT/INR No results for input(s): LABPROT, INR in the last 72 hours. CMP     Component Value Date/Time   NA 127* 05/04/2015 0728   K 4.1 05/04/2015 0728   CL 86* 05/04/2015 0728   CO2 26 05/04/2015 0728   GLUCOSE 148* 05/04/2015 0728   BUN 62* 05/04/2015 0728   CREATININE 5.89* 05/04/2015 0728   CREATININE 0.60 02/13/2015 1607   CALCIUM 7.2* 05/04/2015 0728   PROT 7.4 04/30/2015 0123   ALBUMIN 1.4* 05/04/2015 0728   AST 19 04/30/2015 0123   ALT 14 04/30/2015 0123   ALKPHOS 170* 04/30/2015 0123   BILITOT 0.7 04/30/2015 0123   GFRNONAA 9* 05/04/2015 0728   GFRNONAA >89 02/13/2015 1607   GFRAA 10* 05/04/2015 0728   GFRAA >89 02/13/2015 1607   Lipase     Component Value Date/Time   LIPASE 76* 06/10/2011 1807       Studies/Results: US Renal  05/02/2015   CLINICAL DATA:  Acute renal failure  EXAM: RENAL / URINARY TRACT ULTRASOUND COMPLETE  COMPARISON:  None.  FINDINGS: Right Kidney:  Length: 13.4 cm. Echogenicity within normal limits. No  mass or hydronephrosis visualized.  Left Kidney:  Length: 12.8 cm. Echogenicity within normal limits. No mass or hydronephrosis visualized.  Bladder:  Empty and therefore not well evaluated  IMPRESSION: Study limited by body habitus but appears normal.   Electronically Signed   By: Esperanza Heir M.D.   On: 05/02/2015 16:47   Ir US Guide Vasc Access Left  05/04/2015   CLINICAL DATA:  Cellulitis  EXAM: TUNNELED LEFT INTERNAL JUGULAR PICC LINE PLACEMENT WITH ULTRASOUND AND FLUOROSCOPIC GUIDANCE  FLUOROSCOPY TIME:  12 seconds.  PROCEDURE: The patient was advised of the possible risks andcomplications and agreed to undergo the procedure. The patient was then brought to the angiographic suite for the procedure.  The left neck was prepped with chlorhexidine, drapedin the usual sterile fashion using maximum barrier technique (cap and mask, sterile gown, sterile gloves, large sterile sheet, hand hygiene and cutaneous antisepsis) and infiltrated locally with 1% Lidocaine.  Ultrasound demonstrated patency of the left jugular vein, and this was documented with an image. Under real-time ultrasound guidance, this vein was accessed with a 21 gauge micropuncture needle and image documentation was performed. A long tract was utilized to create a tunnel. A 0.018 wire was introduced in to the vein. Over this, a 5 [place] single lumen Power tunneled PICC was advanced to the lower  SVC/right atrial junction. The cuff was positioned in the subcutaneous tract. Fluoroscopy during the procedure and fluoro spot radiograph confirms appropriate catheter position. The catheter was flushed and covered with asterile dressing.  Catheter length: 24 cm  COMPLICATIONS: None  IMPRESSION: Successful left jugular tunneled power PICC line placement with ultrasound and fluoroscopic guidance. The catheter is ready for use.   Electronically Signed   By: Jolaine Click M.D.   On: 05/04/2015 08:07   Ir Fluoro Guide Cv Midline Picc Left  05/04/2015    CLINICAL DATA:  Cellulitis  EXAM: TUNNELED LEFT INTERNAL JUGULAR PICC LINE PLACEMENT WITH ULTRASOUND AND FLUOROSCOPIC GUIDANCE  FLUOROSCOPY TIME:  12 seconds.  PROCEDURE: The patient was advised of the possible risks andcomplications and agreed to undergo the procedure. The patient was then brought to the angiographic suite for the procedure.  The left neck was prepped with chlorhexidine, drapedin the usual sterile fashion using maximum barrier technique (cap and mask, sterile gown, sterile gloves, large sterile sheet, hand hygiene and cutaneous antisepsis) and infiltrated locally with 1% Lidocaine.  Ultrasound demonstrated patency of the left jugular vein, and this was documented with an image. Under real-time ultrasound guidance, this vein was accessed with a 21 gauge micropuncture needle and image documentation was performed. A long tract was utilized to create a tunnel. A 0.018 wire was introduced in to the vein. Over this, a 5 [place] single lumen Power tunneled PICC was advanced to the lower SVC/right atrial junction. The cuff was positioned in the subcutaneous tract. Fluoroscopy during the procedure and fluoro spot radiograph confirms appropriate catheter position. The catheter was flushed and covered with asterile dressing.  Catheter length: 24 cm  COMPLICATIONS: None  IMPRESSION: Successful left jugular tunneled power PICC line placement with ultrasound and fluoroscopic guidance. The catheter is ready for use.   Electronically Signed   By: Jolaine Click M.D.   On: 05/04/2015 08:07    Anti-infectives: Anti-infectives    Start     Dose/Rate Route Frequency Ordered Stop   05/03/15 2200  piperacillin-tazobactam (ZOSYN) IVPB 2.25 g     2.25 g 100 mL/hr over 30 Minutes Intravenous 3 times per day 05/03/15 1514     05/03/15 1000  linezolid (ZYVOX) IVPB 600 mg     600 mg 300 mL/hr over 60 Minutes Intravenous Every 12 hours 05/03/15 0752     05/03/15 0800  DAPTOmycin (CUBICIN) 1,000 mg in sodium chloride  0.9 % IVPB  Status:  Discontinued     1,000 mg 240 mL/hr over 30 Minutes Intravenous Every 48 hours 05/02/15 1515 05/03/15 0751   05/01/15 0200  cefTRIAXone (ROCEPHIN) 2 g in dextrose 5 % 50 mL IVPB - Premix  Status:  Discontinued     2 g 100 mL/hr over 30 Minutes Intravenous Every 24 hours 04/30/15 0427 04/30/15 0808   04/30/15 1900  ceFAZolin (ANCEF) 3 g in dextrose 5 % 50 mL IVPB     3 g 160 mL/hr over 30 Minutes Intravenous  Once 04/30/15 1854 04/30/15 1946   04/30/15 1200  vancomycin (VANCOCIN) 1,500 mg in sodium chloride 0.9 % 500 mL IVPB  Status:  Discontinued     1,500 mg 250 mL/hr over 120 Minutes Intravenous Every 8 hours 04/30/15 0436 05/01/15 1350   04/30/15 0830  piperacillin-tazobactam (ZOSYN) IVPB 3.375 g  Status:  Discontinued     3.375 g 12.5 mL/hr over 240 Minutes Intravenous 3 times per day 04/30/15 0824 05/03/15 1514   04/30/15 0445  vancomycin (VANCOCIN) 1,500  mg in sodium chloride 0.9 % 500 mL IVPB     1,500 mg 250 mL/hr over 120 Minutes Intravenous  Once 04/30/15 0436 04/30/15 0707   04/30/15 0315  cefTRIAXone (ROCEPHIN) 2 g in dextrose 5 % 50 mL IVPB     2 g 100 mL/hr over 30 Minutes Intravenous  Once 04/30/15 0312 04/30/15 0433   04/30/15 0115  vancomycin (VANCOCIN) IVPB 1000 mg/200 mL premix     1,000 mg 200 mL/hr over 60 Minutes Intravenous  Once 04/30/15 0111 04/30/15 0302       Assessment/Plan Right chest wall subcutaneous abscess -Dressing changes BID WD with 1/2 inch iodoform gauze packing strips -Antibiotics - on zosyn Day #5, Cubicin D #3, WBC down to 12.1, await cultures, appreciate ID involvement -Tolerating carb mod diet Right shoulder abscess - per Dr. Eulah Pont  AKI - per primary service, worsening Morbid obesity - BMI 53.53 Uncontrolled DM -- Needs better glucose control, leave to primary team RUE edema -- Check UE doppler    LOS: 4 days    Dr. Carman Ching

## 2015-05-04 NOTE — Progress Notes (Signed)
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 PMH includes: obesity, asthma, diabetes, hypertension, acute renal failure, septic arthritis.    Clinical Impression   Pt admitted with the above diagnoses and presents with below problem list. Pt will benefit from continued OT to address the below listed deficits and maximize independence with BADLs prior to d/c home. 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 During session pt performed at min +2 assist for LB ADLs and functional mobility. Of note, pt reports getting up on her own earlier today and walking to the bathroom using the IV pole for balance assist. ADLs completed as detailed below. OT to continue to follow acutely. Need clarification on whether there are any ROM restrictions for RUE.      Follow Up Recommendations  Supervision/Assistance - 24 hour;Home health OT    Equipment Recommendations  Tub/shower seat    Recommendations for Other Services       Precautions / Restrictions Precautions Precautions: Fall Required Braces or Orthoses: Sling (RUE for comfort) Restrictions Weight Bearing Restrictions: Yes RUE Weight Bearing: Weight bearing as tolerated Other Position/Activity Restrictions: ROM as tolerated      Mobility Bed Mobility               General bed mobility comments: not assessed  Transfers Overall transfer level: Needs assistance   Transfers: Sit to/from Stand Sit to Stand: Min assist;+2 physical assistance         General transfer comment: from recliner, regular height toilet    Balance Overall balance assessment: Needs assistance Sitting-balance support: Feet supported Sitting balance-Leahy Scale: Fair     Standing  balance support: Single extremity supported;During functional activity Standing balance-Leahy Scale: Fair                              ADL Overall ADL's : Needs assistance/impaired Eating/Feeding: Minimal assistance;Sitting   Grooming: Minimal assistance;Sitting;Cueing for compensatory techniques   Upper Body Bathing: Minimal assitance;With adaptive equipment;Cueing for compensatory techniques;Sitting   Lower Body Bathing: Minimal assistance;+2 for physical assistance;Cueing for compensatory techniques;With adaptive equipment;Sit to/from stand   Upper Body Dressing : Minimal assistance;Sitting;Cueing for compensatory techniques   Lower Body Dressing: Minimal assistance;+2 for physical assistance;Cueing for compensatory techniques;With adaptive equipment;Sit to/from stand   Toilet Transfer: Minimal assistance;+2 for safety/equipment;Ambulation   Toileting- Clothing Manipulation and Hygiene: Minimal assistance;+2 for physical assistance;Sit to/from stand   Tub/ Shower Transfer: Minimal assistance;+2 for safety/equipment;Ambulation;Shower seat   Functional mobility during ADLs: Minimal assistance;+2 for safety/equipment General ADL Comments: +2 min A for safety/equipment with chair follow provided but not utilized. Pt presents with impaired balance and RUE deficits impacting level of assist with ADLs. Educated on compensatory strategies for ADLs and home setup.      Vision     Perception     Praxis      Pertinent Vitals/Pain Pain Assessment: Faces Faces Pain Scale: Hurts even more Pain Location: Rt shoulder Pain Descriptors / Indicators: Grimacing;Operative site guarding Pain Intervention(s): Limited activity within patient's tolerance;Monitored during session;Repositioned     Hand Dominance Right   Extremity/Trunk Assessment Upper Extremity Assessment Upper Extremity Assessment: RUE deficits/detail RUE Deficits / Details: s/p RUE I&D with arthroscopy. Pt  with  3-/5 strength at elbow level. Weak grasp.  RUE Coordination: decreased fine motor   Lower Extremity Assessment Lower Extremity Assessment: Defer to PT evaluation       Communication Communication Communication: No difficulties   Cognition Arousal/Alertness: Awake/alert Behavior During Therapy: WFL for tasks assessed/performed Overall Cognitive Status: Within Functional Limits for tasks assessed                     General Comments       Exercises       Shoulder Instructions      Home Living Family/patient expects to be discharged to:: Private residence Living Arrangements: Children;Other (Comment) (7 y.o. daughter currently staying with a friend.)   Type of Home: Apartment Home Access: Stairs to enter Secretary/administrator of Steps: 2   Home Layout: One level     Bathroom Shower/Tub: Chief Strategy Officer: Standard     Home Equipment: None   Additional Comments: Pt reports "I don't really have anyone to help me at home. Everybody I know is old."      Prior Functioning/Environment Level of Independence: Independent             OT Diagnosis: Acute pain   OT Problem List: Decreased range of motion;Decreased activity tolerance;Impaired balance (sitting and/or standing);Decreased knowledge of use of DME or AE;Decreased knowledge of precautions;Pain;Impaired UE functional use;Obesity   OT Treatment/Interventions: Self-care/ADL training;Therapeutic exercise;Energy conservation;DME and/or AE instruction;Therapeutic activities;Patient/family education;Balance training    OT Goals(Current goals can be found in the care plan section) Acute Rehab OT Goals Patient Stated Goal: not stated OT Goal Formulation: With patient Time For Goal Achievement: 23-May-2015 Potential to Achieve Goals: Good ADL Goals Pt Will Perform Upper Body Bathing: with modified independence;with adaptive equipment;sitting Pt Will Perform Lower Body Bathing: with  supervision;with adaptive equipment;sit to/from stand Pt Will Perform Upper Body Dressing: with adaptive equipment;sitting;with min guard assist Pt Will Perform Lower Body Dressing: with min assist;with adaptive equipment;sit to/from stand Pt Will Transfer to Toilet: with min guard assist;ambulating;regular height toilet;grab bars Pt Will Perform Tub/Shower Transfer: with min guard assist;ambulating;shower seat Pt/caregiver will Perform Home Exercise Program: Right Upper extremity;With written HEP provided (once clarification received from MD)  OT Frequency: Min 2X/week   Barriers to D/C: Decreased caregiver support          Co-evaluation PT/OT/SLP Co-Evaluation/Treatment: Yes Reason for Co-Treatment: For patient/therapist safety PT goals addressed during session: Mobility/safety with mobility;Balance OT goals addressed during session: ADL's and self-care      End of Session    Activity Tolerance: Patient limited by fatigue;Patient tolerated treatment well Patient left: with call bell/phone within reach;Other (comment) (on toilet with PT present)   Time: 7829-5621 OT Time Calculation (min): 26 min Charges:  OT General Charges $OT Visit: 1 Procedure OT Evaluation $Initial OT Evaluation Tier I: 1 Procedure OT Treatments $Self Care/Home Management : 8-22 mins G-Codes:    Pilar Grammes 2015/05/23, 4:06 PM

## 2015-05-05 DIAGNOSIS — N170 Acute kidney failure with tubular necrosis: Secondary | ICD-10-CM

## 2015-05-05 LAB — CBC
HEMATOCRIT: 25.4 % — AB (ref 36.0–46.0)
Hemoglobin: 8 g/dL — ABNORMAL LOW (ref 12.0–15.0)
MCH: 23.9 pg — ABNORMAL LOW (ref 26.0–34.0)
MCHC: 31.5 g/dL (ref 30.0–36.0)
MCV: 75.8 fL — ABNORMAL LOW (ref 78.0–100.0)
Platelets: 422 10*3/uL — ABNORMAL HIGH (ref 150–400)
RBC: 3.35 MIL/uL — ABNORMAL LOW (ref 3.87–5.11)
RDW: 15.3 % (ref 11.5–15.5)
WBC: 12.6 10*3/uL — ABNORMAL HIGH (ref 4.0–10.5)

## 2015-05-05 LAB — RENAL FUNCTION PANEL
Albumin: 1.4 g/dL — ABNORMAL LOW (ref 3.5–5.0)
Anion gap: 14 (ref 5–15)
BUN: 63 mg/dL — ABNORMAL HIGH (ref 6–20)
CHLORIDE: 87 mmol/L — AB (ref 101–111)
CO2: 28 mmol/L (ref 22–32)
Calcium: 7.2 mg/dL — ABNORMAL LOW (ref 8.9–10.3)
Creatinine, Ser: 5.81 mg/dL — ABNORMAL HIGH (ref 0.44–1.00)
GFR calc non Af Amer: 9 mL/min — ABNORMAL LOW (ref >60)
GFR, EST AFRICAN AMERICAN: 10 mL/min — AB (ref >60)
Glucose, Bld: 115 mg/dL — ABNORMAL HIGH (ref 65–99)
POTASSIUM: 4.3 mmol/L (ref 3.5–5.1)
Phosphorus: 6.4 mg/dL — ABNORMAL HIGH (ref 2.5–4.6)
Sodium: 129 mmol/L — ABNORMAL LOW (ref 135–145)

## 2015-05-05 LAB — GLUCOSE, CAPILLARY
GLUCOSE-CAPILLARY: 158 mg/dL — AB (ref 65–99)
GLUCOSE-CAPILLARY: 143 mg/dL — AB (ref 65–99)
Glucose-Capillary: 112 mg/dL — ABNORMAL HIGH (ref 65–99)
Glucose-Capillary: 152 mg/dL — ABNORMAL HIGH (ref 65–99)

## 2015-05-05 LAB — ANAEROBIC CULTURE: GRAM STAIN: NONE SEEN

## 2015-05-05 MED ORDER — INSULIN GLARGINE 100 UNIT/ML ~~LOC~~ SOLN
60.0000 [IU] | Freq: Every day | SUBCUTANEOUS | Status: DC
  Administered 2015-05-05 – 2015-05-08 (×4): 60 [IU] via SUBCUTANEOUS
  Filled 2015-05-05 (×4): qty 0.6

## 2015-05-05 MED ORDER — ALUM & MAG HYDROXIDE-SIMETH 200-200-20 MG/5ML PO SUSP
15.0000 mL | Freq: Four times a day (QID) | ORAL | Status: DC | PRN
  Administered 2015-05-05 – 2015-05-06 (×3): 15 mL via ORAL
  Filled 2015-05-05 (×3): qty 30

## 2015-05-05 MED ORDER — INSULIN ASPART 100 UNIT/ML ~~LOC~~ SOLN
10.0000 [IU] | Freq: Three times a day (TID) | SUBCUTANEOUS | Status: DC
  Administered 2015-05-05 – 2015-05-11 (×18): 10 [IU] via SUBCUTANEOUS

## 2015-05-05 NOTE — Progress Notes (Signed)
[facility]                                                                              Patient Demographics  Lindsay [name], is a 34 y.o. female, DOB - 1981-02-08, GNF:[id number]  Admit date - 04/30/2015   Admitting Physician Houston Siren, MD  Outpatient Primary MD for the patient is Doris Cheadle, MD  LOS - 5   Chief Complaint  Patient presents with  . Shoulder Pain       Brief HPI   The patient is a 34 year old female with obesity, asthma, diabetes, hypertension presented to ED with right shoulder worsening pain. Patient was recently seen in the ED and had a small I&D done in ER and was placed on doxycycline. Patient however continued to have increased pain but no subjective fevers or chills. Evaluation in ED included a marked leukocytosis with WBC of 18 K and elevated blood sugar in 400s. Patient was started on IV vancomycin and hospitalist was asked to admit for cellulitis and hyperglycemia.   Assessment & Plan    Principal Problem: Cellulitis of right shoulder and chest wall with abscess  -postop day # 5 - MRI of the right shoulder which showed severe muscle edema with small 5 x 6x 25 mm fluid collection overall appearance concerning for myositis due to infectious or inflammatory etiology, large glenohumeral joint effusion, possible septic arthritis - underwent irrigation and debridement of the right shoulder with arthroscopy and patient was also found to have right chest wall subcutaneous abscess and Dr. Derrell Lolling was consulted intraoperatively. Patient underwent I&D of the right chest wall abscess as well, on 04-30-2015. - Creatinine function noticed to be trending up, vancomycin was discontinued. ID consult was obtained, patient seen by Dr. Ninetta Lights on 5/12, recommended to change to Zyvox and Zosyn. -day 5 of IV antibiotics.  -PT and OT consulted.   Encephalopathy; patient was lethargic yesterday. She appears more alert today. Suspect multifactorial; metabolic  acidosis, renal failure, infectious process.   Decreased  gabapentin to 100 mg TID due to renal failure.   Diabetes mellitus due to underlying condition :  - Blood sugars controlled, monitor blood sugars closely due to acute kidney injury worsening.  - I will decrease lantus to 60 units to avoid hypoglycemia. In setting of renal failure.   Acute kidney injury: - Creatinine at 5, good urine out put yesterday 1.1 L -Vancomycin was discontinued and renal consult was obtained, renal ultrasound showed no renal obstruction. -fluids discontinue  Lower extremity edema;  Check doppler.  Fluids discontinue.   Right upper extremity swelling - doppler negative for DVT  Obesity - Patient counseled on diet and weight control  HTN (hypertension) -Currently BP borderline soft, not on any antihypertensive  Hyponatremia: Improving, related to renal failure and pseudohyponatremia from hyperglycemia.   Patient also had some bleeding from the site of heparin shots - no significant bleeding. Resume heparin.    Code Status: Full code   Family Communication: Discussed in detail with the patient, all imaging results, lab results explained to the patient   Disposition Plan: Not medically ready   Time Spent in minutes  25 minutes  Procedures  Irrigation and debridement of the right shoulder   I&D of the chest wall 5-10  Consults   Orthopedics, Dr. Eulah Pont   general surgery, Dr. Derrell Lolling  infectious disease Nephrology  DVT Prophylaxis SCDs . No significant bleeding, start heparin.   Medications  Scheduled Meds: . bisacodyl  10 mg Rectal Once  . docusate sodium  100 mg Oral BID  . gabapentin  100 mg Oral TID  . heparin subcutaneous  5,000 Units Subcutaneous 3 times per day  . insulin aspart  0-20 Units Subcutaneous TID WC  . insulin aspart  0-5 Units Subcutaneous QHS  . insulin aspart  10 Units Subcutaneous TID WC  . insulin glargine  60 Units Subcutaneous Daily  . linezolid  600 mg  Intravenous Q12H  . nystatin   Topical TID  . piperacillin-tazobactam (ZOSYN)  IV  2.25 g Intravenous 3 times per day  . polyethylene glycol  17 g Oral Daily  . [START ON 05/06/2015] Vitamin D (Ergocalciferol)  50,000 Units Oral Q7 days   Continuous Infusions:   PRN Meds:.acetaminophen, alum & mag hydroxide-simeth, HYDROmorphone (DILAUDID) injection, metoCLOPramide **OR** metoCLOPramide (REGLAN) injection, ondansetron **OR** ondansetron (ZOFRAN) IV, oxyCODONE-acetaminophen, sodium chloride   Antibiotics   Anti-infectives    Start     Dose/Rate Route Frequency Ordered Stop   05/03/15 2200  piperacillin-tazobactam (ZOSYN) IVPB 2.25 g     2.25 g 100 mL/hr over 30 Minutes Intravenous 3 times per day 05/03/15 1514     05/03/15 1000  linezolid (ZYVOX) IVPB 600 mg     600 mg 300 mL/hr over 60 Minutes Intravenous Every 12 hours 05/03/15 0752     05/03/15 0800  DAPTOmycin (CUBICIN) 1,000 mg in sodium chloride 0.9 % IVPB  Status:  Discontinued     1,000 mg 240 mL/hr over 30 Minutes Intravenous Every 48 hours 05/02/15 1515 05/03/15 0751   05/01/15 0200  cefTRIAXone (ROCEPHIN) 2 g in dextrose 5 % 50 mL IVPB - Premix  Status:  Discontinued     2 g 100 mL/hr over 30 Minutes Intravenous Every 24 hours 04/30/15 0427 04/30/15 0808   04/30/15 1900  ceFAZolin (ANCEF) 3 g in dextrose 5 % 50 mL IVPB     3 g 160 mL/hr over 30 Minutes Intravenous  Once 04/30/15 1854 04/30/15 1946   04/30/15 1200  vancomycin (VANCOCIN) 1,500 mg in sodium chloride 0.9 % 500 mL IVPB  Status:  Discontinued     1,500 mg 250 mL/hr over 120 Minutes Intravenous Every 8 hours 04/30/15 0436 05/01/15 1350   04/30/15 0830  piperacillin-tazobactam (ZOSYN) IVPB 3.375 g  Status:  Discontinued     3.375 g 12.5 mL/hr over 240 Minutes Intravenous 3 times per day 04/30/15 0824 05/03/15 1514   04/30/15 0445  vancomycin (VANCOCIN) 1,500 mg in sodium chloride 0.9 % 500 mL IVPB     1,500 mg 250 mL/hr over 120 Minutes Intravenous  Once  04/30/15 0436 04/30/15 0707   04/30/15 0315  cefTRIAXone (ROCEPHIN) 2 g in dextrose 5 % 50 mL IVPB     2 g 100 mL/hr over 30 Minutes Intravenous  Once 04/30/15 0312 04/30/15 0433   04/30/15 0115  vancomycin (VANCOCIN) IVPB 1000 mg/200 mL premix     1,000 mg 200 mL/hr over 60 Minutes Intravenous  Once 04/30/15 0111 04/30/15 0302        Subjective:   Lindsay [name] was seen and examined today. She wants something for indigestion.  Lower extremity edema worse  when she sits ./     Objective:   Blood pressure 103/52, pulse 94, temperature 98.3 F (36.8 C), temperature source Oral, resp. rate 18, height 5\' 8"  (1.727 m), weight 159.666 kg (352 lb), last menstrual period 04/23/2015, SpO2 97 %.  Wt Readings from Last 3 Encounters:  04/30/15 159.666 kg (352 lb)  04/03/15 156.945 kg (346 lb)  02/13/15 154.949 kg (341 lb 9.6 oz)     Intake/Output Summary (Last 24 hours) at 05/05/15 1513 Last data filed at 05/05/15 1355  Gross per 24 hour  Intake    360 ml  Output   1425 ml  Net  -1065 ml    Exam  GeneralIs  NAD, alert, sitting up in the chair  Neck: Supple, no JVD, no masses  CVS: S1 S2 auscultated, RRR  RespiratoryClear to auscultation bilaterally  Abdomen: Soft, nontender, nondistended, + bowel sounds, fungal infection, abdominal skin folds.   Ext: no cyanosis clubbing or edema, dressing intact on the right chest and shoulder . LE with plus 2 edema.   Skin:  dressing intact on the right chest and shoulder    Data Review   Micro Results Recent Results (from the past 240 hour(s))  Culture, routine-abscess     Status: None (Preliminary result)   Collection Time: 04/27/15  6:36 AM  Result Value Ref Range Status   Specimen Description ABSCESS RIGHT SHOULDER  Final   Special Requests NONE  Final   Gram Stain   Final    FEW WBC PRESENT,BOTH PMN AND MONONUCLEAR RARE GRAM NEGATIVE RODS Performed at [facility]    Culture   Final    NO GROWTH Note:  CONTINUING TO HOLD FOR 14 DAYS Performed at [facility]    Report Status PENDING  Incomplete  Surgical pcr screen     Status: Abnormal   Collection Time: 04/30/15  3:23 PM  Result Value Ref Range Status   MRSA, PCR NEGATIVE NEGATIVE Final   Staphylococcus aureus POSITIVE (A) NEGATIVE Final    Comment:        The Xpert SA Assay (FDA approved for NASAL specimens in patients over 10 years of age), is one component of a comprehensive surveillance program.  Test performance has been validated by [facility] for patients greater than or equal to 89 year old. It is not intended to diagnose infection nor to guide or monitor treatment.   Anaerobic culture     Status: None (Preliminary result)   Collection Time: 04/30/15  7:41 PM  Result Value Ref Range Status   Specimen Description WOUND RIGHT SHOULDER  Final   Special Requests RIGHT SHOULDER  Final   Gram Stain PENDING  Incomplete   Culture   Final    No Anaerobes Isolated; Culture in Progress for 14 DAYS Performed at [facility]    Report Status PENDING  Incomplete  Culture, routine-abscess     Status: None   Collection Time: 04/30/15  7:41 PM  Result Value Ref Range Status   Specimen Description ABSCESS RIGHT SHOULDER  Final   Special Requests PATIENT ON FOLLOWING VANCOMYCIN ZOSYN  Final   Gram Stain   Final    NO WBC SEEN NO SQUAMOUS EPITHELIAL CELLS SEEN NO ORGANISMS SEEN Performed at [facility]    Culture   Final    NO GROWTH 3 DAYS Performed at [facility]    Report Status 05/04/2015 FINAL  Final  Anaerobic culture     Status: None  Collection Time: 04/30/15  7:47 PM  Result Value Ref Range Status   Specimen Description ABSCESS  DRAINAGE FROM RIGHT SHOULDER  Final   Special Requests PATIENT ON FOLLOWING VANCOMYCIN ZOSYN B  Final   Gram Stain   Final    NO WBC SEEN NO SQUAMOUS EPITHELIAL CELLS SEEN NO ORGANISMS SEEN Performed at [facility]    Culture   Final     NO ANAEROBES ISOLATED Performed at [facility]    Report Status 05/05/2015 FINAL  Final  Culture, routine-abscess     Status: None   Collection Time: 04/30/15  7:47 PM  Result Value Ref Range Status   Specimen Description ABSCESS RIGHT SHOULDER DRAINAGE  Final   Special Requests   Final    PATIENT ON FOLLOWING VANCOMYCIN ZOSYN SPECIMEN B 2 CUPS   Gram Stain   Final    NO WBC SEEN NO SQUAMOUS EPITHELIAL CELLS SEEN NO ORGANISMS SEEN Performed at [facility]    Culture   Final    NO GROWTH 3 DAYS Performed at [facility]    Report Status 05/04/2015 FINAL  Final  Anaerobic culture     Status: None (Preliminary result)   Collection Time: 04/30/15  7:48 PM  Result Value Ref Range Status   Specimen Description FLUID RIGHT SHOULDER JOINT  Final   Special Requests PATIENT ON FOLLOWING VANCOMYCIN ZOSYN C  Final   Gram Stain PENDING  Incomplete   Culture   Final    No Anaerobes Isolated; Culture in Progress for 14 DAYS Performed at [facility]    Report Status PENDING  Incomplete  Body fluid culture     Status: None   Collection Time: 04/30/15  7:48 PM  Result Value Ref Range Status   Specimen Description FLUID RIGHT SHOULDER JOINT  Final   Special Requests PATIENT ON FOLLOWING VANC,ZOSYN  Final   Gram Stain   Final    RARE WBC PRESENT, PREDOMINANTLY PMN NO ORGANISMS SEEN Performed at [facility]    Culture   Final    NO GROWTH 3 DAYS Performed at [facility]    Report Status 05/04/2015 FINAL  Final    Radiology Reports Dg Shoulder Right  04/19/2015   CLINICAL DATA:  Acute onset of right shoulder pain. Heard right shoulder pop 2 days ago. Initial encounter.  EXAM: RIGHT SHOULDER - 2+ VIEW  COMPARISON:  None.  FINDINGS: There is no evidence of fracture or dislocation. The right humeral head is seated within the glenoid fossa. The acromioclavicular joint is unremarkable in appearance. No significant soft tissue abnormalities  are seen. The visualized portions of the right lung are clear.  IMPRESSION: No evidence of fracture or dislocation.   Electronically Signed   By: Roanna Raider M.D.   On: 04/19/2015 01:26   Ct Shoulder Right Wo Contrast  04/27/2015   CLINICAL DATA:  Constant right shoulder pain for 10 days  EXAM: CT OF THE RIGHT SHOULDER WITHOUT CONTRAST  TECHNIQUE: Multidetector CT imaging was performed according to the standard protocol. Multiplanar CT image reconstructions were also generated.  COMPARISON:  Radiographs 04/19/2015  FINDINGS: There is no fracture or dislocation. There is no bone lesion. There is mild osteolysis of the distal clavicle. This can be seen after trauma or with inflammatory arthropathies. It can be idiopathic. No soft tissue mass is evident. There is no abnormal fluid collection. Visible portions of the right ribs and chest wall appear unremarkable.  IMPRESSION: Osteolysis  of the distal right clavicle, uncertain chronicity.   Electronically Signed   By: Ellery Plunk M.D.   On: 04/27/2015 03:43   US Renal  05/02/2015   CLINICAL DATA:  Acute renal failure  EXAM: RENAL / URINARY TRACT ULTRASOUND COMPLETE  COMPARISON:  None.  FINDINGS: Right Kidney:  Length: 13.4 cm. Echogenicity within normal limits. No mass or hydronephrosis visualized.  Left Kidney:  Length: 12.8 cm. Echogenicity within normal limits. No mass or hydronephrosis visualized.  Bladder:  Empty and therefore not well evaluated  IMPRESSION: Study limited by body habitus but appears normal.   Electronically Signed   By: Esperanza Heir M.D.   On: 05/02/2015 16:47   Mr Shoulder Right Wo Contrast  04/30/2015   CLINICAL DATA:  Right shoulder pain. Recent history of antibiotic use. Patient was in too much pain to continue with the contrasted portion of the exam.  EXAM: MRI OF THE RIGHT SHOULDER WITHOUT CONTRAST  TECHNIQUE: Multiplanar, multisequence MR imaging of the shoulder was performed. No intravenous contrast was administered.   COMPARISON:  None.  FINDINGS: Rotator cuff: Mild tendinosis of the supraspinatus and infraspinatus tendon. Teres minor tendon is intact. Subscapularis tendon is intact.  Muscles: There is severe muscle edema in the subscapularis muscle with small 5 x 6 x 25 mm fluid collection. There is severe muscle edema in the anterior and posterior deltoid muscles with a 14 mm fluid collection in the deltoid muscle. There is soft tissue edema superficial to the subscapularis and infraspinatus muscles. There is mild muscle edema along the periphery of the teres minor muscle.  Biceps long head:  Intact.  Acromioclavicular Joint: Mild degenerative change of the acromioclavicular joint. Type II acromion.  Glenohumeral Joint: Large joint effusion.  No chondral defect.  Labrum:  Intact.  Bones: No focal marrow signal abnormality. No fracture or dislocation.  IMPRESSION: 1. Severe muscle edema in the subscapularis muscle with small 5 x 6 x 25 mm fluid collection. There is severe muscle edema in the anterior and posterior deltoid muscles with a 14 mm fluid collection in the deltoid muscle. Soft tissue edema superficial to the subscapularis and infraspinatus muscles. Mild muscle edema along the periphery of the teres minor muscle. The overall appearance is most concerning for myositis which may be secondary to an infectious or inflammatory etiology. The small fluid collections make the process more concerning for an infectious etiology. 2. Large glenohumeral joint effusion. Septic arthritis cannot be excluded.   Electronically Signed   By: Elige Ko   On: 04/30/2015 10:45   Ir US Guide Vasc Access Left  05/04/2015   CLINICAL DATA:  Cellulitis  EXAM: TUNNELED LEFT INTERNAL JUGULAR PICC LINE PLACEMENT WITH ULTRASOUND AND FLUOROSCOPIC GUIDANCE  FLUOROSCOPY TIME:  12 seconds.  PROCEDURE: The patient was advised of the possible risks andcomplications and agreed to undergo the procedure. The patient was then brought to the angiographic  suite for the procedure.  The left neck was prepped with chlorhexidine, drapedin the usual sterile fashion using maximum barrier technique (cap and mask, sterile gown, sterile gloves, large sterile sheet, hand hygiene and cutaneous antisepsis) and infiltrated locally with 1% Lidocaine.  Ultrasound demonstrated patency of the left jugular vein, and this was documented with an image. Under real-time ultrasound guidance, this vein was accessed with a 21 gauge micropuncture needle and image documentation was performed. A long tract was utilized to create a tunnel. A 0.018 wire was introduced in to the vein. Over this, a 5 [place] single lumen  Power tunneled PICC was advanced to the lower SVC/right atrial junction. The cuff was positioned in the subcutaneous tract. Fluoroscopy during the procedure and fluoro spot radiograph confirms appropriate catheter position. The catheter was flushed and covered with asterile dressing.  Catheter length: 24 cm  COMPLICATIONS: None  IMPRESSION: Successful left jugular tunneled power PICC line placement with ultrasound and fluoroscopic guidance. The catheter is ready for use.   Electronically Signed   By: Jolaine Click M.D.   On: 05/04/2015 08:07   Ir Fluoro Guide Cv Midline Picc Left  05/04/2015   CLINICAL DATA:  Cellulitis  EXAM: TUNNELED LEFT INTERNAL JUGULAR PICC LINE PLACEMENT WITH ULTRASOUND AND FLUOROSCOPIC GUIDANCE  FLUOROSCOPY TIME:  12 seconds.  PROCEDURE: The patient was advised of the possible risks andcomplications and agreed to undergo the procedure. The patient was then brought to the angiographic suite for the procedure.  The left neck was prepped with chlorhexidine, drapedin the usual sterile fashion using maximum barrier technique (cap and mask, sterile gown, sterile gloves, large sterile sheet, hand hygiene and cutaneous antisepsis) and infiltrated locally with 1% Lidocaine.  Ultrasound demonstrated patency of the left jugular vein, and this was documented with an  image. Under real-time ultrasound guidance, this vein was accessed with a 21 gauge micropuncture needle and image documentation was performed. A long tract was utilized to create a tunnel. A 0.018 wire was introduced in to the vein. Over this, a 5 [place] single lumen Power tunneled PICC was advanced to the lower SVC/right atrial junction. The cuff was positioned in the subcutaneous tract. Fluoroscopy during the procedure and fluoro spot radiograph confirms appropriate catheter position. The catheter was flushed and covered with asterile dressing.  Catheter length: 24 cm  COMPLICATIONS: None  IMPRESSION: Successful left jugular tunneled power PICC line placement with ultrasound and fluoroscopic guidance. The catheter is ready for use.   Electronically Signed   By: Jolaine Click M.D.   On: 05/04/2015 08:07    CBC  Recent Labs Lab 04/30/15 0123  05/02/15 0517 05/02/15 1848 05/03/15 0342 05/04/15 1018 05/05/15 0534  WBC 18.2*  < > 19.5* 15.6* 16.3* 12.1* 12.6*  HGB 10.7*  < > 9.5* 9.0* 9.4* 8.4* 8.0*  HCT 32.5*  < > 29.3* 28.6* 28.6* 26.4* 25.4*  PLT 353  < > 387 412* 440* 358 422*  MCV 75.1*  < > 75.9* 76.1* 75.1* 75.6* 75.8*  MCH 24.7*  < > 24.6* 23.9* 24.7* 24.1* 23.9*  MCHC 32.9  < > 32.4 31.5 32.9 31.8 31.5  RDW 14.1  < > 15.3 15.0 15.1 15.1 15.3  LYMPHSABS 0.0*  --   --   --   --   --   --   MONOABS 0.0*  --   --   --   --   --   --   EOSABS 0.0  --   --   --   --   --   --   BASOSABS 0.0  --   --   --   --   --   --   < > = values in this interval not displayed.  Chemistries   Recent Labs Lab 04/30/15 0123  05/02/15 0517 05/02/15 1848 05/03/15 0342 05/04/15 0728 05/05/15 0534  NA 121*  < > 126* 124* 127* 127* 129*  K 4.2  < > 4.9 4.2 4.1 4.1 4.3  CL 86*  < > 90* 88* 92* 86* 87*  CO2 23  < > 19* 21* 19*  26 28  GLUCOSE 438*  < > 209* 224* 138* 148* 115*  BUN 18  < > 43* 48* 53* 62* 63*  CREATININE 1.05*  < > 4.30* 4.80* 5.17* 5.89* 5.81*  CALCIUM 8.3*  < > 7.5* 7.5* 7.7*  7.2* 7.2*  AST 19  --   --   --   --   --   --   ALT 14  --   --   --   --   --   --   ALKPHOS 170*  --   --   --   --   --   --   BILITOT 0.7  --   --   --   --   --   --   < > = values in this interval not displayed. ------------------------------------------------------------------------------------------------------------------ estimated creatinine clearance is 22.2 mL/min (by C-G formula based on Cr of 5.81). ------------------------------------------------------------------------------------------------------------------ No results for input(s): HGBA1C in the last 72 hours. ------------------------------------------------------------------------------------------------------------------ No results for input(s): CHOL, HDL, LDLCALC, TRIG, CHOLHDL, LDLDIRECT in the last 72 hours. ------------------------------------------------------------------------------------------------------------------ No results for input(s): TSH, T4TOTAL, T3FREE, THYROIDAB in the last 72 hours.  Invalid input(s): FREET3 ------------------------------------------------------------------------------------------------------------------ No results for input(s): VITAMINB12, FOLATE, FERRITIN, TIBC, IRON, RETICCTPCT in the last 72 hours.  Coagulation profile No results for input(s): INR, PROTIME in the last 168 hours.  No results for input(s): DDIMER in the last 72 hours.  Cardiac Enzymes No results for input(s): CKMB, TROPONINI, MYOGLOBIN in the last 168 hours.  Invalid input(s): CK ------------------------------------------------------------------------------------------------------------------ Invalid input(s): POCBNP   Recent Labs  05/04/15 0635 05/04/15 1148 05/04/15 1656 05/04/15 2147 05/05/15 0623 05/05/15 1145  GLUCAP 131* 189* 111* 108* 112* 158*     Raesha Coonrod A M.D. [facility] 05/05/2015, 3:13 PM  Pager: [phone]  Between 7am to 7pm - call Pager - [phone]  After 7pm  go to [email or web address] - password [email or web address]  Call night coverage person covering after 7pm

## 2015-05-05 NOTE — Progress Notes (Signed)
Subjective:  Somewhat lethargic, had trouble sleeping. Feels weak. Working with PT when I see  Objective Vital signs in last 24 hours: Filed Vitals:   05/03/15 1940 05/04/15 0525 05/04/15 2118 05/05/15 0505  BP: 135/76 96/44 125/72 103/52  Pulse: 109 98 99 94  Temp: 98.3 F (36.8 C) 98.8 F (37.1 C) 99 F (37.2 C) 98.3 F (36.8 C)  TempSrc: Oral Oral Oral Oral  Resp: 18 18 18 18   Height:      Weight:      SpO2: 95% 92% 99% 97%   Weight change:   Intake/Output Summary (Last 24 hours) at 05/05/15 0729 Last data filed at 05/05/15 0506  Gross per 24 hour  Intake    480 ml  Output   1150 ml  Net   -72 ml   34 year old with HTN/DM/obesity/asthma presenting with septic arthritis of shoulder s/p surgical management on 5/10- now with complicating AKI with baseline normal renal function  Assessment/Plan: 1. Acute renal failure: No longer oliguric. Most likely due to ATN from relative hypotension/SIRS related to infection and surgery. Random vancomycin levels are low and unlikely to be the culprit for her ATN. Urine sediment does not point to an acute GN. Renal ultrasound is normal. - Continue strict intake/output monitor with foley.  - Recommend daily labs and avoiding nephrotoxins including iodinated intravenous contrast and nonsteroidal anti-inflammatory drugs.  - Hopefully renal insult is at a plateau. Continue to monitor, but doubt intervention will be required.  2. Hyponatremia: This is now true hyponatremia from acute renal failure/defective free water handling, but improving off IVF. 3. Anion gap metabolic acidosis: Secondary to acute renal failure; acidosis resolved, gap closed.  4. Right septic joint: Antibiotic therapy currently broad-spectrum with Zosyn/zyvox per ID. Cultures unrevealing.  Labs:  FENa: 0.22%  Recent Labs Lab 05/03/15 0342 05/04/15 0728 05/05/15 0534  NA 127* 127* 129*  K 4.1 4.1 4.3  CL 92* 86* 87*  CO2 19* 26 28  GLUCOSE 138* 148* 115*  BUN 53*  62* 63*  CREATININE 5.17* 5.89* 5.81*  CALCIUM 7.7* 7.2* 7.2*  PHOS  --  6.2* 6.4*   Recent Labs Lab 04/30/15 0123 05/04/15 0728 05/05/15 0534  AST 19  --   --   ALT 14  --   --   ALKPHOS 170*  --   --   BILITOT 0.7  --   --   PROT 7.4  --   --   ALBUMIN 1.9* 1.4* 1.4*   Recent Labs Lab 04/30/15 0123  05/02/15 0517 05/02/15 1848 05/03/15 0342 05/04/15 1018 05/05/15 0534  WBC 18.2*  < > 19.5* 15.6* 16.3* 12.1* 12.6*  NEUTROABS 0.0*  --   --   --   --   --   --   HGB 10.7*  < > 9.5* 9.0* 9.4* 8.4* 8.0*  HCT 32.5*  < > 29.3* 28.6* 28.6* 26.4* 25.4*  MCV 75.1*  < > 75.9* 76.1* 75.1* 75.6* 75.8*  PLT 353  < > 387 412* 440* 358 422*  < > = values in this interval not displayed. CBG:  Recent Labs Lab 05/04/15 0635 05/04/15 1148 05/04/15 1656 05/04/15 2147 05/05/15 0623  GLUCAP 131* 189* 111* 108* 112*   Studies/Results: Ir US Guide Vasc Access Left  05/04/2015   CLINICAL DATA:  Cellulitis  EXAM: TUNNELED LEFT INTERNAL JUGULAR PICC LINE PLACEMENT WITH ULTRASOUND AND FLUOROSCOPIC GUIDANCE  FLUOROSCOPY TIME:  12 seconds.  PROCEDURE: The patient was advised of the possible  risks andcomplications and agreed to undergo the procedure. The patient was then brought to the angiographic suite for the procedure.  The left neck was prepped with chlorhexidine, drapedin the usual sterile fashion using maximum barrier technique (cap and mask, sterile gown, sterile gloves, large sterile sheet, hand hygiene and cutaneous antisepsis) and infiltrated locally with 1% Lidocaine.  Ultrasound demonstrated patency of the left jugular vein, and this was documented with an image. Under real-time ultrasound guidance, this vein was accessed with a 21 gauge micropuncture needle and image documentation was performed. A long tract was utilized to create a tunnel. A 0.018 wire was introduced in to the vein. Over this, a 5 [place] single lumen Power tunneled PICC was advanced to the lower SVC/right atrial  junction. The cuff was positioned in the subcutaneous tract. Fluoroscopy during the procedure and fluoro spot radiograph confirms appropriate catheter position. The catheter was flushed and covered with asterile dressing.  Catheter length: 24 cm  COMPLICATIONS: None  IMPRESSION: Successful left jugular tunneled power PICC line placement with ultrasound and fluoroscopic guidance. The catheter is ready for use.   Electronically Signed   By: Jolaine Click M.D.   On: 05/04/2015 08:07   Ir Fluoro Guide Cv Midline Picc Left  05/04/2015   CLINICAL DATA:  Cellulitis  EXAM: TUNNELED LEFT INTERNAL JUGULAR PICC LINE PLACEMENT WITH ULTRASOUND AND FLUOROSCOPIC GUIDANCE  FLUOROSCOPY TIME:  12 seconds.  PROCEDURE: The patient was advised of the possible risks andcomplications and agreed to undergo the procedure. The patient was then brought to the angiographic suite for the procedure.  The left neck was prepped with chlorhexidine, drapedin the usual sterile fashion using maximum barrier technique (cap and mask, sterile gown, sterile gloves, large sterile sheet, hand hygiene and cutaneous antisepsis) and infiltrated locally with 1% Lidocaine.  Ultrasound demonstrated patency of the left jugular vein, and this was documented with an image. Under real-time ultrasound guidance, this vein was accessed with a 21 gauge micropuncture needle and image documentation was performed. A long tract was utilized to create a tunnel. A 0.018 wire was introduced in to the vein. Over this, a 5 [place] single lumen Power tunneled PICC was advanced to the lower SVC/right atrial junction. The cuff was positioned in the subcutaneous tract. Fluoroscopy during the procedure and fluoro spot radiograph confirms appropriate catheter position. The catheter was flushed and covered with asterile dressing.  Catheter length: 24 cm  COMPLICATIONS: None  IMPRESSION: Successful left jugular tunneled power PICC line placement with ultrasound and fluoroscopic  guidance. The catheter is ready for use.   Electronically Signed   By: Jolaine Click M.D.   On: 05/04/2015 08:07   Medications: Infusions:   Scheduled Medications: . bisacodyl  10 mg Rectal Once  . docusate sodium  100 mg Oral BID  . gabapentin  100 mg Oral TID  . heparin subcutaneous  5,000 Units Subcutaneous 3 times per day  . insulin aspart  0-20 Units Subcutaneous TID WC  . insulin aspart  0-5 Units Subcutaneous QHS  . insulin aspart  10 Units Subcutaneous TID WC  . insulin glargine  60 Units Subcutaneous Daily  . linezolid  600 mg Intravenous Q12H  . nystatin   Topical TID  . piperacillin-tazobactam (ZOSYN)  IV  2.25 g Intravenous 3 times per day  . polyethylene glycol  17 g Oral Daily  . [START ON 05/06/2015] Vitamin D (Ergocalciferol)  50,000 Units Oral Q7 days   I have reviewed scheduled and prn medications.  Physical Exam:  General: NAD Heart: Regular rate (90's), no murmur or rub Lungs: Nonlabored, not taking deep breaths. Distant, clear breath sounds.  Abdomen: soft non tender Extremities: obese extremities- worse edema Access: Left IJ PICC  Ryan B. Jarvis Newcomer, MD, PGY-2 05/05/2015 7:29 AM  LOS: 5 days    Patient seen and examined, agree with above note with above modifications.  UOP picking up and creatinine stable- hopefully seeing plateau before improvement.  Will continue to follow  Annie Sable, MD 05/05/2015

## 2015-05-05 NOTE — Progress Notes (Signed)
5 Days Post-Op  Subjective: Still with moderate pain  Objective: Vital signs in last 24 hours: Temp:  [98.3 F (36.8 C)-99 F (37.2 C)] 98.3 F (36.8 C) (05/15 0505) Pulse Rate:  [94-99] 94 (05/15 0505) Resp:  [18] 18 (05/15 0505) BP: (103-125)/(52-72) 103/52 mmHg (05/15 0505) SpO2:  [97 %-99 %] 97 % (05/15 0505) Last BM Date: 05/03/15  Intake/Output from previous day: 05/14 0701 - 05/15 0700 In: 480 [P.O.:480] Out: 1150 [Urine:1150] Intake/Output this shift: Total I/O In: 240 [P.O.:240] Out: -   Ambulating with PT so could address wound  Lab Results:   Recent Labs  05/04/15 1018 05/05/15 0534  WBC 12.1* 12.6*  HGB 8.4* 8.0*  HCT 26.4* 25.4*  PLT 358 422*   BMET  Recent Labs  05/04/15 0728 05/05/15 0534  NA 127* 129*  K 4.1 4.3  CL 86* 87*  CO2 26 28  GLUCOSE 148* 115*  BUN 62* 63*  CREATININE 5.89* 5.81*  CALCIUM 7.2* 7.2*   PT/INR No results for input(s): LABPROT, INR in the last 72 hours. ABG No results for input(s): PHART, HCO3 in the last 72 hours.  Invalid input(s): PCO2, PO2  Studies/Results: Ir US Guide Vasc Access Left  05/04/2015   CLINICAL DATA:  Cellulitis  EXAM: TUNNELED LEFT INTERNAL JUGULAR PICC LINE PLACEMENT WITH ULTRASOUND AND FLUOROSCOPIC GUIDANCE  FLUOROSCOPY TIME:  12 seconds.  PROCEDURE: The patient was advised of the possible risks andcomplications and agreed to undergo the procedure. The patient was then brought to the angiographic suite for the procedure.  The left neck was prepped with chlorhexidine, drapedin the usual sterile fashion using maximum barrier technique (cap and mask, sterile gown, sterile gloves, large sterile sheet, hand hygiene and cutaneous antisepsis) and infiltrated locally with 1% Lidocaine.  Ultrasound demonstrated patency of the left jugular vein, and this was documented with an image. Under real-time ultrasound guidance, this vein was accessed with a 21 gauge micropuncture needle and image documentation was  performed. A long tract was utilized to create a tunnel. A 0.018 wire was introduced in to the vein. Over this, a 5 [place] single lumen Power tunneled PICC was advanced to the lower SVC/right atrial junction. The cuff was positioned in the subcutaneous tract. Fluoroscopy during the procedure and fluoro spot radiograph confirms appropriate catheter position. The catheter was flushed and covered with asterile dressing.  Catheter length: 24 cm  COMPLICATIONS: None  IMPRESSION: Successful left jugular tunneled power PICC line placement with ultrasound and fluoroscopic guidance. The catheter is ready for use.   Electronically Signed   By: Jolaine Click M.D.   On: 05/04/2015 08:07   Ir Fluoro Guide Cv Midline Picc Left  05/04/2015   CLINICAL DATA:  Cellulitis  EXAM: TUNNELED LEFT INTERNAL JUGULAR PICC LINE PLACEMENT WITH ULTRASOUND AND FLUOROSCOPIC GUIDANCE  FLUOROSCOPY TIME:  12 seconds.  PROCEDURE: The patient was advised of the possible risks andcomplications and agreed to undergo the procedure. The patient was then brought to the angiographic suite for the procedure.  The left neck was prepped with chlorhexidine, drapedin the usual sterile fashion using maximum barrier technique (cap and mask, sterile gown, sterile gloves, large sterile sheet, hand hygiene and cutaneous antisepsis) and infiltrated locally with 1% Lidocaine.  Ultrasound demonstrated patency of the left jugular vein, and this was documented with an image. Under real-time ultrasound guidance, this vein was accessed with a 21 gauge micropuncture needle and image documentation was performed. A long tract was utilized to create a tunnel. A 0.018  wire was introduced in to the vein. Over this, a 5 [place] single lumen Power tunneled PICC was advanced to the lower SVC/right atrial junction. The cuff was positioned in the subcutaneous tract. Fluoroscopy during the procedure and fluoro spot radiograph confirms appropriate catheter position. The catheter was  flushed and covered with asterile dressing.  Catheter length: 24 cm  COMPLICATIONS: None  IMPRESSION: Successful left jugular tunneled power PICC line placement with ultrasound and fluoroscopic guidance. The catheter is ready for use.   Electronically Signed   By: Jolaine Click M.D.   On: 05/04/2015 08:07    Anti-infectives: Anti-infectives    Start     Dose/Rate Route Frequency Ordered Stop   05/03/15 2200  piperacillin-tazobactam (ZOSYN) IVPB 2.25 g     2.25 g 100 mL/hr over 30 Minutes Intravenous 3 times per day 05/03/15 1514     05/03/15 1000  linezolid (ZYVOX) IVPB 600 mg     600 mg 300 mL/hr over 60 Minutes Intravenous Every 12 hours 05/03/15 0752     05/03/15 0800  DAPTOmycin (CUBICIN) 1,000 mg in sodium chloride 0.9 % IVPB  Status:  Discontinued     1,000 mg 240 mL/hr over 30 Minutes Intravenous Every 48 hours 05/02/15 1515 05/03/15 0751   05/01/15 0200  cefTRIAXone (ROCEPHIN) 2 g in dextrose 5 % 50 mL IVPB - Premix  Status:  Discontinued     2 g 100 mL/hr over 30 Minutes Intravenous Every 24 hours 04/30/15 0427 04/30/15 0808   04/30/15 1900  ceFAZolin (ANCEF) 3 g in dextrose 5 % 50 mL IVPB     3 g 160 mL/hr over 30 Minutes Intravenous  Once 04/30/15 1854 04/30/15 1946   04/30/15 1200  vancomycin (VANCOCIN) 1,500 mg in sodium chloride 0.9 % 500 mL IVPB  Status:  Discontinued     1,500 mg 250 mL/hr over 120 Minutes Intravenous Every 8 hours 04/30/15 0436 05/01/15 1350   04/30/15 0830  piperacillin-tazobactam (ZOSYN) IVPB 3.375 g  Status:  Discontinued     3.375 g 12.5 mL/hr over 240 Minutes Intravenous 3 times per day 04/30/15 0824 05/03/15 1514   04/30/15 0445  vancomycin (VANCOCIN) 1,500 mg in sodium chloride 0.9 % 500 mL IVPB     1,500 mg 250 mL/hr over 120 Minutes Intravenous  Once 04/30/15 0436 04/30/15 0707   04/30/15 0315  cefTRIAXone (ROCEPHIN) 2 g in dextrose 5 % 50 mL IVPB     2 g 100 mL/hr over 30 Minutes Intravenous  Once 04/30/15 0312 04/30/15 0433   04/30/15 0115   vancomycin (VANCOCIN) IVPB 1000 mg/200 mL premix     1,000 mg 200 mL/hr over 60 Minutes Intravenous  Once 04/30/15 0111 04/30/15 0302      Assessment/Plan: s/p Procedure(s): IRRIGATION AND DEBRIDEMENT SHOULDER (Right) ARTHROSCOPY SHOULDER (Right)  WBC still up slightly Continue wound care  LOS: 5 days    Emilo Gras A 05/05/2015

## 2015-05-05 NOTE — Progress Notes (Signed)
Physical Therapy Treatment Patient Details Name: Lindsay [name] MRN: [id number] DOB: July 07, 1981 Today's Date: 05/05/2015    History of Present Illness Pt is s/p I&D of the right shoulder with arthroscopy and I&D of the right chest wall abscess as well, on 04-30-2015. PMH includes: obesity, asthma, diabetes, hypertension, acute renal failure, septic arthritis.     PT Comments    Pt needs max encouragement to participate in therapy/mobility. She typically reports that her legs are numb and inability to stand. With encouragement, she is able to stand and participate. Per ortho PN, she is WBAT and ROM as tolerated RUE with sling for comfort. Pt refuses removal of sling during PT.  Follow Up Recommendations  Home health PT;Supervision/Assistance - 24 hour     Equipment Recommendations  Rolling walker with 5" wheels    Recommendations for Other Services       Precautions / Restrictions Precautions Precautions: Fall Required Braces or Orthoses: Sling (for comfort RUE) Restrictions RUE Weight Bearing: Weight bearing as tolerated Other Position/Activity Restrictions: ROM as tolerated    Mobility  Bed Mobility   Bed Mobility: Supine to Sit     Supine to sit: Mod assist;+2 for physical assistance        Transfers   Equipment used: None   Sit to Stand: Min assist;+2 physical assistance         General transfer comment: multi attempts needed  Ambulation/Gait Ambulation/Gait assistance: Min guard;+2 safety/equipment Ambulation Distance (Feet): 60 Feet Assistive device: None (pt pushing IV pole) Gait Pattern/deviations: Step-through pattern;Decreased stride length;Wide base of support Gait velocity: very slow Gait velocity interpretation: Below normal speed for age/gender     Stairs            Wheelchair Mobility    Modified Rankin (Stroke Patients Only)       Balance     Sitting balance-Leahy Scale: Good       Standing balance-Leahy Scale: Fair                      Cognition Arousal/Alertness: Awake/alert Behavior During Therapy: Flat affect Overall Cognitive Status: Within Functional Limits for tasks assessed                      Exercises      General Comments        Pertinent Vitals/Pain Pain Assessment: 0-10 Pain Score: 10-Worst pain ever Pain Location: R shoulder Pain Intervention(s): Monitored during session;Repositioned    Home Living                      Prior Function            PT Goals (current goals can now be found in the care plan section) Acute Rehab PT Goals Patient Stated Goal: not stated PT Goal Formulation: With patient Time For Goal Achievement: 05/18/15 Potential to Achieve Goals: Good Progress towards PT goals: Progressing toward goals    Frequency  Min 3X/week    PT Plan Current plan remains appropriate    Co-evaluation             End of Session Equipment Utilized During Treatment: Other (comment) (RUE sling) Activity Tolerance: No increased pain Patient left: in chair;with call bell/phone within reach     Time: 0940-1009 PT Time Calculation (min) (ACUTE ONLY): 29 min  Charges:  $Gait Training: 23-37 mins  G Codes:      Ilda Foil 05/05/2015, 10:33 AM

## 2015-05-05 NOTE — Progress Notes (Signed)
Patient has open blisters under breasts bilaterally. There is a current wound consult order, but no wound nurse available for the weekend. The plan was to order inter-dry for the patient, but an A11 has to be signed by the wound nurse in order for the [employer]'s staff to supply the product. The number for the wound nurse has been paged three times with no response. MD was contacted in hopes of getting approval from the [employer] staff, but this effort was unsuccessful. For now, patient has been cleaned and dried under the breast. Nurse will pass along this information to night nurse. Night nurse will be instructed to deliver this message to day nurse on 05/06/15, so the wound nurse will be aware of the issue. Will continue to monitor patient.

## 2015-05-06 ENCOUNTER — Encounter (HOSPITAL_COMMUNITY): Payer: Medicaid Other

## 2015-05-06 LAB — GLUCOSE, CAPILLARY
GLUCOSE-CAPILLARY: 96 mg/dL (ref 65–99)
GLUCOSE-CAPILLARY: 130 mg/dL — AB (ref 65–99)
Glucose-Capillary: 164 mg/dL — ABNORMAL HIGH (ref 65–99)

## 2015-05-06 LAB — RENAL FUNCTION PANEL
ANION GAP: 12 (ref 5–15)
Albumin: 1.4 g/dL — ABNORMAL LOW (ref 3.5–5.0)
BUN: 64 mg/dL — ABNORMAL HIGH (ref 6–20)
CO2: 30 mmol/L (ref 22–32)
Calcium: 7.2 mg/dL — ABNORMAL LOW (ref 8.9–10.3)
Chloride: 91 mmol/L — ABNORMAL LOW (ref 101–111)
Creatinine, Ser: 5.58 mg/dL — ABNORMAL HIGH (ref 0.44–1.00)
GFR calc non Af Amer: 9 mL/min — ABNORMAL LOW (ref >60)
GFR, EST AFRICAN AMERICAN: 11 mL/min — AB (ref >60)
GLUCOSE: 158 mg/dL — AB (ref 65–99)
PHOSPHORUS: 6 mg/dL — AB (ref 2.5–4.6)
POTASSIUM: 4 mmol/L (ref 3.5–5.1)
SODIUM: 133 mmol/L — AB (ref 135–145)

## 2015-05-06 LAB — CBC
HCT: 25 % — ABNORMAL LOW (ref 36.0–46.0)
Hemoglobin: 7.8 g/dL — ABNORMAL LOW (ref 12.0–15.0)
MCH: 24 pg — AB (ref 26.0–34.0)
MCHC: 31.2 g/dL (ref 30.0–36.0)
MCV: 76.9 fL — AB (ref 78.0–100.0)
PLATELETS: 456 10*3/uL — AB (ref 150–400)
RBC: 3.25 MIL/uL — ABNORMAL LOW (ref 3.87–5.11)
RDW: 15.5 % (ref 11.5–15.5)
WBC: 11.2 10*3/uL — AB (ref 4.0–10.5)

## 2015-05-06 LAB — CK: CK TOTAL: 290 U/L — AB (ref 38–234)

## 2015-05-06 MED ORDER — FERROUS SULFATE 325 (65 FE) MG PO TABS
325.0000 mg | ORAL_TABLET | Freq: Two times a day (BID) | ORAL | Status: DC
  Administered 2015-05-06 – 2015-05-14 (×17): 325 mg via ORAL
  Filled 2015-05-06 (×17): qty 1

## 2015-05-06 MED ORDER — FLUCONAZOLE 150 MG PO TABS
150.0000 mg | ORAL_TABLET | Freq: Once | ORAL | Status: AC
  Administered 2015-05-06: 150 mg via ORAL
  Filled 2015-05-06: qty 1

## 2015-05-06 NOTE — Progress Notes (Signed)
Occupational Therapy Treatment Patient Details Name: Lindsay [name] MRN: [id number] DOB: [birth year] Today's Date: 05/06/2015    History of present illness Pt is s/p I&D of the right shoulder with arthroscopy and I&D of the right chest wall abscess as well, on 04-30-2015. PMH includes: obesity, asthma, diabetes, hypertension, acute renal failure, septic arthritis.    OT comments  Pt progressing. Pt issued AE to increase independence with ADL. Encouraged pt to use RUE to assist with ADL tasks during session with use of compensatory techniques and AE. Encouraged pt to only using sling for comfort when walking in order to increase functional use of RUE. Increased time required for all tasks. Nsg to place interdry. Will follow with exercises.  Follow Up Recommendations  Supervision/Assistance - 24 hour;Home health OT    Equipment Recommendations  Tub/shower seat    Recommendations for Other Services      Precautions / Restrictions Precautions Precautions: Fall Required Braces or Orthoses: Sling Restrictions Weight Bearing Restrictions: No RUE Weight Bearing: Weight bearing as tolerated Other Position/Activity Restrictions: ROM as tolerated       Mobility Bed Mobility Overal bed mobility: Needs Assistance Bed Mobility: Sit to Supine     Supine to sit: Min assist     General bed mobility comments: Educated on using momentum to help with bed mobility, in addition to pushing with BLE to help assist body onto bed  Transfers Overall transfer level: Needs assistance   Transfers: Sit to/from Stand Sit to Stand: Min assist         General transfer comment: educated on positioning toward edge of chair and using momentum    Balance Overall balance assessment: Needs assistance           Standing balance-Leahy Scale: Fair Standing balance comment: Pt more comfortable holding onto IV pole when walking                   ADL           Upper Body Bathing: Minimal  assitance;Cueing for safety;Cueing for sequencing;Standing   Lower Body Bathing: Minimal assistance;With adaptive equipment           Toilet Transfer: Min guard   Toileting- Clothing Manipulation and Hygiene: Minimal assistance;Sit to/from stand;With adaptive equipment (toilet aid)       Functional mobility during ADLs: Min guard General ADL Comments: educated oncompensatory techniques for ADL. Began using AE. Has difficulty cleaning under pannus due to RUE weakness. AE increases independence with these tasks                                      Cognition   Behavior During Therapy: Flat affect Overall Cognitive Status: Within Functional Limits for tasks assessed                       Extremity/Trunk Assessment   States RUE feels "tingly"                         General Comments  Took approximately 5 minutes for pt to stand up from recliner    Pertinent Vitals/ Pain       Pain Assessment: Faces Faces Pain Scale: Hurts little more Pain Location: R shoulder/under pannus Pain Descriptors / Indicators: Aching;Grimacing Pain Intervention(s): Limited activity within patient's tolerance;Monitored during session;Repositioned  Home Living  Prior Functioning/Environment              Frequency Min 2X/week     Progress Toward Goals  OT Goals(current goals can now be found in the care plan section)  Progress towards OT goals: Progressing toward goals  Acute Rehab OT Goals Patient Stated Goal: not stated OT Goal Formulation: With patient Time For Goal Achievement: 05/20/15 Potential to Achieve Goals: Good ADL Goals Pt Will Perform Upper Body Bathing: with modified independence;with adaptive equipment;sitting Pt Will Perform Lower Body Bathing: with supervision;with adaptive equipment;sit to/from stand Pt Will Perform Upper Body Dressing: with adaptive equipment;sitting;with min  guard assist Pt Will Perform Lower Body Dressing: with min assist;with adaptive equipment;sit to/from stand Pt Will Transfer to Toilet: with min guard assist;ambulating;regular height toilet;grab bars Pt Will Perform Tub/Shower Transfer: with min guard assist;ambulating;shower seat Pt/caregiver will Perform Home Exercise Program: Right Upper extremity;With written HEP provided  Plan Discharge plan remains appropriate    Co-evaluation                 End of Session Equipment Utilized During Treatment: Other (comment) (IV pole)   Activity Tolerance Patient tolerated treatment well   Patient Left in bed;with call bell/phone within reach;with nursing/sitter in room   Nurse Communication Mobility status;Precautions;Weight bearing status;Other (comment) (use of AE for ADL)        Time: 1610-9604 OT Time Calculation (min): 40 min  Charges: OT General Charges $OT Visit: 1 Procedure OT Treatments $Self Care/Home Management : 38-52 mins  Saliyah Gillin,HILLARY 05/06/2015, 12:23 PM   [facility], OTR/L  [phone] 05/06/2015

## 2015-05-06 NOTE — Consult Note (Signed)
WOC wound consult note Reason for Consult: Consult requested for bilat breast folds and also thigh skin folds. WOC assistance requested for use of Interdry. Pt has skin folds beneath breasts which are red, macerated, and moist with partial thickness fissures and skin loss. Thigh skin folds are the same appearance consistent with intertrigo.  Interdry silver-impregnated fabric ordered for use by bedside nurses and instructions provided.  This product should remain in place for 5 days for optimal plan of care to provide antimicrobial benefits and wick moisture away from skin. Discussed use and plan of care with patient and she verbalizes understanding. Please re-consult if further assistance is needed.  Thank-you,  Cammie Mcgee MSN, RN, CWOCN, [place], CNS [phone]
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
## 2015-05-06 NOTE — Progress Notes (Signed)
Patient ID: Lindsay [name], female   DOB: 1981-04-27, 34 y.o.   MRN: [id number]   LOS: 6 days   Subjective: Feels about the same, shoulder feels funny. Edema slightly improved, RUE sensation unchanged.   Objective: Vital signs in last 24 hours: Temp:  [97.9 F (36.6 C)-99.5 F (37.5 C)] 98.5 F (36.9 C) (05/16 0535) Pulse Rate:  [92-96] 92 (05/16 0535) Resp:  [16-18] 16 (05/16 0535) BP: (115-130)/(51-62) 115/62 mmHg (05/16 0535) SpO2:  [95 %-100 %] 100 % (05/16 0535) Last BM Date: 05/05/15   Laboratory  CBC  Recent Labs  05/05/15 0534 05/06/15 0435  WBC 12.6* 11.2*  HGB 8.0* 7.8*  HCT 25.4* 25.0*  PLT 422* 456*   BMET  Recent Labs  05/05/15 0534 05/06/15 0435  NA 129* 133*  K 4.3 4.0  CL 87* 91*  CO2 28 30  GLUCOSE 115* 158*  BUN 63* 64*  CREATININE 5.81* 5.58*  CALCIUM 7.2* 7.2*   CBG (last 3)   Recent Labs  05/05/15 1643 05/05/15 2201 05/06/15 0644  GLUCAP 152* 143* 164*    Physical Exam General appearance: alert and no distress Extremities: Still with active purulence from superior wound, less from inferior one. No significant odor.   Assessment/Plan: Right chest wall subcutaneous abscess -Dressing changes BID WD with 1/2 inch iodoform gauze packing strips -Antibiotics - on zosyn Day #7, Zyvox D #4, WBC is down to 11.2, await cultures, appreciate ID involvement -Tolerating carb mod diet Right shoulder abscess - per Dr. Eulah Pont  AKI - per primary service, slightly improved Morbid obesity - BMI 53.53 Uncontrolled DM -- Glucose control improved    Freeman Caldron, PA-C Pager: [phone] 05/06/2015

## 2015-05-06 NOTE — Progress Notes (Signed)
[facility]                                                                              Patient Demographics  Lindsay [name], is a 34 y.o. female, DOB - [birth year], WUJ:[id number]  Admit date - 04/30/2015   Admitting Physician Houston Siren, MD  Outpatient Primary MD for the patient is Doris Cheadle, MD  LOS - 6   Chief Complaint  Patient presents with  . Shoulder Pain       Brief HPI   The patient is a 34 year old female with obesity, asthma, diabetes, hypertension presented to ED with right shoulder worsening pain. Patient was recently seen in the ED and had a small I&D done in ER and was placed on doxycycline. Patient however continued to have increased pain but no subjective fevers or chills. Evaluation in ED included a marked leukocytosis with WBC of 18 K and elevated blood sugar in 400s. Patient was started on IV vancomycin and hospitalist was asked to admit for cellulitis and hyperglycemia.   Assessment & Plan    Principal Problem: Cellulitis of right shoulder and chest wall with abscess  -postop day # 6 - MRI of the right shoulder which showed severe muscle edema with small 5 x 6x 25 mm fluid collection overall appearance concerning for myositis due to infectious or inflammatory etiology, large glenohumeral joint effusion, possible septic arthritis - underwent irrigation and debridement of the right shoulder with arthroscopy and patient was also found to have right chest wall subcutaneous abscess and Dr. Derrell Lolling was consulted intraoperatively. Patient underwent I&D of the right chest wall abscess as well, on 04-30-2015. - Creatinine function noticed to be trending up, vancomycin was discontinued. ID consult was obtained, patient seen by Dr. Ninetta Lights on 5/12, recommended to change to Zyvox and Zosyn. -day 6 of IV antibiotics.  -PT and OT consulted.  -follow up ID recommendations regarding length of antibiotics.   Encephalopathy;Suspect multifactorial;  metabolic acidosis, renal failure, infectious process.   Decreased  gabapentin to 100 mg TID due to renal failure.   Diabetes mellitus due to underlying condition :  - Blood sugars controlled, monitor blood sugars closely due to acute kidney injury worsening.  - Continue with  lantus to 60 units to avoid hypoglycemia. In setting of renal failure.   Acute kidney injury: - Creatinine stable at 5, good urine out put yesterday 1.9 L -Vancomycin was discontinued and renal consult was obtained, renal ultrasound showed no renal obstruction. -fluids discontinue  Lower extremity edema;  Check doppler.  Fluids discontinue.   Right upper extremity swelling - doppler negative for DVT  Obesity - Patient counseled on diet and weight control  HTN (hypertension) -Currently BP borderline soft, not on any antihypertensive  Hyponatremia: Improving, related to renal failure and pseudohyponatremia from hyperglycemia.   Patient also had some bleeding from the site of heparin shots - no significant bleeding. Resume heparin.   Skin, maceration, skin candidiasis;  Nystatin p[owder ordered.  Wound care nurse.    Code Status: Full code   Family Communication: Discussed in detail with the patient, all imaging results, lab results explained to the patient  Disposition Plan: Not medically ready   Time Spent in minutes   25 minutes  Procedures  Irrigation and debridement of the right shoulder   I&D of the chest wall 5-10  Consults   Orthopedics, Dr. Eulah Pont   general surgery, Dr. Derrell Lolling  infectious disease Nephrology  DVT Prophylaxis SCDs . No significant bleeding, start heparin.   Medications  Scheduled Meds: . bisacodyl  10 mg Rectal Once  . docusate sodium  100 mg Oral BID  . gabapentin  100 mg Oral TID  . heparin subcutaneous  5,000 Units Subcutaneous 3 times per day  . insulin aspart  0-20 Units Subcutaneous TID WC  . insulin aspart  0-5 Units Subcutaneous QHS  . insulin  aspart  10 Units Subcutaneous TID WC  . insulin glargine  60 Units Subcutaneous Daily  . linezolid  600 mg Intravenous Q12H  . nystatin   Topical TID  . piperacillin-tazobactam (ZOSYN)  IV  2.25 g Intravenous 3 times per day  . polyethylene glycol  17 g Oral Daily  . Vitamin D (Ergocalciferol)  50,000 Units Oral Q7 days   Continuous Infusions:   PRN Meds:.acetaminophen, alum & mag hydroxide-simeth, HYDROmorphone (DILAUDID) injection, metoCLOPramide **OR** metoCLOPramide (REGLAN) injection, ondansetron **OR** ondansetron (ZOFRAN) IV, oxyCODONE-acetaminophen, sodium chloride   Antibiotics   Anti-infectives    Start     Dose/Rate Route Frequency Ordered Stop   05/03/15 2200  piperacillin-tazobactam (ZOSYN) IVPB 2.25 g     2.25 g 100 mL/hr over 30 Minutes Intravenous 3 times per day 05/03/15 1514     05/03/15 1000  linezolid (ZYVOX) IVPB 600 mg     600 mg 300 mL/hr over 60 Minutes Intravenous Every 12 hours 05/03/15 0752     05/03/15 0800  DAPTOmycin (CUBICIN) 1,000 mg in sodium chloride 0.9 % IVPB  Status:  Discontinued     1,000 mg 240 mL/hr over 30 Minutes Intravenous Every 48 hours 05/02/15 1515 05/03/15 0751   05/01/15 0200  cefTRIAXone (ROCEPHIN) 2 g in dextrose 5 % 50 mL IVPB - Premix  Status:  Discontinued     2 g 100 mL/hr over 30 Minutes Intravenous Every 24 hours 04/30/15 0427 04/30/15 0808   04/30/15 1900  ceFAZolin (ANCEF) 3 g in dextrose 5 % 50 mL IVPB     3 g 160 mL/hr over 30 Minutes Intravenous  Once 04/30/15 1854 04/30/15 1946   04/30/15 1200  vancomycin (VANCOCIN) 1,500 mg in sodium chloride 0.9 % 500 mL IVPB  Status:  Discontinued     1,500 mg 250 mL/hr over 120 Minutes Intravenous Every 8 hours 04/30/15 0436 05/01/15 1350   04/30/15 0830  piperacillin-tazobactam (ZOSYN) IVPB 3.375 g  Status:  Discontinued     3.375 g 12.5 mL/hr over 240 Minutes Intravenous 3 times per day 04/30/15 0824 05/03/15 1514   04/30/15 0445  vancomycin (VANCOCIN) 1,500 mg in sodium  chloride 0.9 % 500 mL IVPB     1,500 mg 250 mL/hr over 120 Minutes Intravenous  Once 04/30/15 0436 04/30/15 0707   04/30/15 0315  cefTRIAXone (ROCEPHIN) 2 g in dextrose 5 % 50 mL IVPB     2 g 100 mL/hr over 30 Minutes Intravenous  Once 04/30/15 0312 04/30/15 0433   04/30/15 0115  vancomycin (VANCOCIN) IVPB 1000 mg/200 mL premix     1,000 mg 200 mL/hr over 60 Minutes Intravenous  Once 04/30/15 0111 04/30/15 0302        Subjective:   Lindsay [name] was seen and examined today.  Indigestion better. Right shoulder pain better than on admission. Still with pain on and off right shoulder, chest.     Objective:   Blood pressure 115/62, pulse 92, temperature 98.5 F (36.9 C), temperature source Oral, resp. rate 16, height 5\' 8"  (1.727 m), weight 159.666 kg (352 lb), last menstrual period 04/23/2015, SpO2 100 %.  Wt Readings from Last 3 Encounters:  04/30/15 159.666 kg (352 lb)  04/03/15 156.945 kg (346 lb)  02/13/15 154.949 kg (341 lb 9.6 oz)     Intake/Output Summary (Last 24 hours) at 05/06/15 0809 Last data filed at 05/06/15 0500  Gross per 24 hour  Intake    720 ml  Output   1975 ml  Net  -1255 ml    Exam  GeneralIs  NAD, alert  Neck: Supple, no JVD, no masses  CVS: S1 S2 auscultated, RRR  Respiratory Clear to auscultation bilaterally  Abdomen: Soft, nontender, nondistended, + bowel sounds, fungal infection, abdominal skin folds. Breast folds.   Ext: no cyanosis clubbing or edema, dressing intact on the right chest and shoulder . LE with plus 2 edema.   Skin:  dressing intact on the right chest and shoulder    Data Review   Micro Results Recent Results (from the past 240 hour(s))  Culture, routine-abscess     Status: None (Preliminary result)   Collection Time: 04/27/15  6:36 AM  Result Value Ref Range Status   Specimen Description ABSCESS RIGHT SHOULDER  Final   Special Requests NONE  Final   Gram Stain   Final    FEW WBC PRESENT,BOTH PMN AND  MONONUCLEAR RARE GRAM NEGATIVE RODS Performed at [facility]    Culture   Final    NO GROWTH Note: CONTINUING TO HOLD FOR 14 DAYS Performed at [facility]    Report Status PENDING  Incomplete  Surgical pcr screen     Status: Abnormal   Collection Time: 04/30/15  3:23 PM  Result Value Ref Range Status   MRSA, PCR NEGATIVE NEGATIVE Final   Staphylococcus aureus POSITIVE (A) NEGATIVE Final    Comment:        The Xpert SA Assay (FDA approved for NASAL specimens in patients over 5 years of age), is one component of a comprehensive surveillance program.  Test performance has been validated by [facility] for patients greater than or equal to 78 year old. It is not intended to diagnose infection nor to guide or monitor treatment.   Anaerobic culture     Status: None (Preliminary result)   Collection Time: 04/30/15  7:41 PM  Result Value Ref Range Status   Specimen Description WOUND RIGHT SHOULDER  Final   Special Requests RIGHT SHOULDER  Final   Gram Stain PENDING  Incomplete   Culture   Final    No Anaerobes Isolated; Culture in Progress for 14 DAYS Performed at [facility]    Report Status PENDING  Incomplete  Culture, routine-abscess     Status: None   Collection Time: 04/30/15  7:41 PM  Result Value Ref Range Status   Specimen Description ABSCESS RIGHT SHOULDER  Final   Special Requests PATIENT ON FOLLOWING VANCOMYCIN ZOSYN  Final   Gram Stain   Final    NO WBC SEEN NO SQUAMOUS EPITHELIAL CELLS SEEN NO ORGANISMS SEEN Performed at [facility]    Culture   Final    NO GROWTH 3 DAYS Performed at [facility]    Report Status 05/04/2015 FINAL  Final  Anaerobic culture     Status: None   Collection Time: 04/30/15  7:47 PM  Result Value Ref Range Status   Specimen Description ABSCESS  DRAINAGE FROM RIGHT SHOULDER  Final   Special Requests PATIENT ON FOLLOWING VANCOMYCIN ZOSYN B  Final   Gram Stain   Final    NO WBC  SEEN NO SQUAMOUS EPITHELIAL CELLS SEEN NO ORGANISMS SEEN Performed at [facility]    Culture   Final    NO ANAEROBES ISOLATED Performed at [facility]    Report Status 05/05/2015 FINAL  Final  Culture, routine-abscess     Status: None   Collection Time: 04/30/15  7:47 PM  Result Value Ref Range Status   Specimen Description ABSCESS RIGHT SHOULDER DRAINAGE  Final   Special Requests   Final    PATIENT ON FOLLOWING VANCOMYCIN ZOSYN SPECIMEN B 2 CUPS   Gram Stain   Final    NO WBC SEEN NO SQUAMOUS EPITHELIAL CELLS SEEN NO ORGANISMS SEEN Performed at [facility]    Culture   Final    NO GROWTH 3 DAYS Performed at [facility]    Report Status 05/04/2015 FINAL  Final  Anaerobic culture     Status: None (Preliminary result)   Collection Time: 04/30/15  7:48 PM  Result Value Ref Range Status   Specimen Description FLUID RIGHT SHOULDER JOINT  Final   Special Requests PATIENT ON FOLLOWING VANCOMYCIN ZOSYN C  Final   Gram Stain PENDING  Incomplete   Culture   Final    No Anaerobes Isolated; Culture in Progress for 14 DAYS Performed at [facility]    Report Status PENDING  Incomplete  Body fluid culture     Status: None   Collection Time: 04/30/15  7:48 PM  Result Value Ref Range Status   Specimen Description FLUID RIGHT SHOULDER JOINT  Final   Special Requests PATIENT ON FOLLOWING VANC,ZOSYN  Final   Gram Stain   Final    RARE WBC PRESENT, PREDOMINANTLY PMN NO ORGANISMS SEEN Performed at [facility]    Culture   Final    NO GROWTH 3 DAYS Performed at [facility]    Report Status 05/04/2015 FINAL  Final    Radiology Reports Dg Shoulder Right  04/19/2015   CLINICAL DATA:  Acute onset of right shoulder pain. Heard right shoulder pop 2 days ago. Initial encounter.  EXAM: RIGHT SHOULDER - 2+ VIEW  COMPARISON:  None.  FINDINGS: There is no evidence of fracture or dislocation. The right humeral head is seated within  the glenoid fossa. The acromioclavicular joint is unremarkable in appearance. No significant soft tissue abnormalities are seen. The visualized portions of the right lung are clear.  IMPRESSION: No evidence of fracture or dislocation.   Electronically Signed   By: Roanna Raider M.D.   On: 04/19/2015 01:26   Ct Shoulder Right Wo Contrast  04/27/2015   CLINICAL DATA:  Constant right shoulder pain for 10 days  EXAM: CT OF THE RIGHT SHOULDER WITHOUT CONTRAST  TECHNIQUE: Multidetector CT imaging was performed according to the standard protocol. Multiplanar CT image reconstructions were also generated.  COMPARISON:  Radiographs 04/19/2015  FINDINGS: There is no fracture or dislocation. There is no bone lesion. There is mild osteolysis of the distal clavicle. This can be seen after trauma or with inflammatory arthropathies. It can be idiopathic. No soft tissue mass is evident. There is no abnormal fluid collection. Visible portions  of the right ribs and chest wall appear unremarkable.  IMPRESSION: Osteolysis of the distal right clavicle, uncertain chronicity.   Electronically Signed   By: Ellery Plunk M.D.   On: 04/27/2015 03:43   US Renal  05/02/2015   CLINICAL DATA:  Acute renal failure  EXAM: RENAL / URINARY TRACT ULTRASOUND COMPLETE  COMPARISON:  None.  FINDINGS: Right Kidney:  Length: 13.4 cm. Echogenicity within normal limits. No mass or hydronephrosis visualized.  Left Kidney:  Length: 12.8 cm. Echogenicity within normal limits. No mass or hydronephrosis visualized.  Bladder:  Empty and therefore not well evaluated  IMPRESSION: Study limited by body habitus but appears normal.   Electronically Signed   By: Esperanza Heir M.D.   On: 05/02/2015 16:47   Mr Shoulder Right Wo Contrast  04/30/2015   CLINICAL DATA:  Right shoulder pain. Recent history of antibiotic use. Patient was in too much pain to continue with the contrasted portion of the exam.  EXAM: MRI OF THE RIGHT SHOULDER WITHOUT CONTRAST   TECHNIQUE: Multiplanar, multisequence MR imaging of the shoulder was performed. No intravenous contrast was administered.  COMPARISON:  None.  FINDINGS: Rotator cuff: Mild tendinosis of the supraspinatus and infraspinatus tendon. Teres minor tendon is intact. Subscapularis tendon is intact.  Muscles: There is severe muscle edema in the subscapularis muscle with small 5 x 6 x 25 mm fluid collection. There is severe muscle edema in the anterior and posterior deltoid muscles with a 14 mm fluid collection in the deltoid muscle. There is soft tissue edema superficial to the subscapularis and infraspinatus muscles. There is mild muscle edema along the periphery of the teres minor muscle.  Biceps long head:  Intact.  Acromioclavicular Joint: Mild degenerative change of the acromioclavicular joint. Type II acromion.  Glenohumeral Joint: Large joint effusion.  No chondral defect.  Labrum:  Intact.  Bones: No focal marrow signal abnormality. No fracture or dislocation.  IMPRESSION: 1. Severe muscle edema in the subscapularis muscle with small 5 x 6 x 25 mm fluid collection. There is severe muscle edema in the anterior and posterior deltoid muscles with a 14 mm fluid collection in the deltoid muscle. Soft tissue edema superficial to the subscapularis and infraspinatus muscles. Mild muscle edema along the periphery of the teres minor muscle. The overall appearance is most concerning for myositis which may be secondary to an infectious or inflammatory etiology. The small fluid collections make the process more concerning for an infectious etiology. 2. Large glenohumeral joint effusion. Septic arthritis cannot be excluded.   Electronically Signed   By: Elige Ko   On: 04/30/2015 10:45   Ir US Guide Vasc Access Left  05/04/2015   CLINICAL DATA:  Cellulitis  EXAM: TUNNELED LEFT INTERNAL JUGULAR PICC LINE PLACEMENT WITH ULTRASOUND AND FLUOROSCOPIC GUIDANCE  FLUOROSCOPY TIME:  12 seconds.  PROCEDURE: The patient was advised of  the possible risks andcomplications and agreed to undergo the procedure. The patient was then brought to the angiographic suite for the procedure.  The left neck was prepped with chlorhexidine, drapedin the usual sterile fashion using maximum barrier technique (cap and mask, sterile gown, sterile gloves, large sterile sheet, hand hygiene and cutaneous antisepsis) and infiltrated locally with 1% Lidocaine.  Ultrasound demonstrated patency of the left jugular vein, and this was documented with an image. Under real-time ultrasound guidance, this vein was accessed with a 21 gauge micropuncture needle and image documentation was performed. A long tract was utilized to create a tunnel. A 0.018 wire was  introduced in to the vein. Over this, a 5 [place] single lumen Power tunneled PICC was advanced to the lower SVC/right atrial junction. The cuff was positioned in the subcutaneous tract. Fluoroscopy during the procedure and fluoro spot radiograph confirms appropriate catheter position. The catheter was flushed and covered with asterile dressing.  Catheter length: 24 cm  COMPLICATIONS: None  IMPRESSION: Successful left jugular tunneled power PICC line placement with ultrasound and fluoroscopic guidance. The catheter is ready for use.   Electronically Signed   By: Jolaine Click M.D.   On: 05/04/2015 08:07   Ir Fluoro Guide Cv Midline Picc Left  05/04/2015   CLINICAL DATA:  Cellulitis  EXAM: TUNNELED LEFT INTERNAL JUGULAR PICC LINE PLACEMENT WITH ULTRASOUND AND FLUOROSCOPIC GUIDANCE  FLUOROSCOPY TIME:  12 seconds.  PROCEDURE: The patient was advised of the possible risks andcomplications and agreed to undergo the procedure. The patient was then brought to the angiographic suite for the procedure.  The left neck was prepped with chlorhexidine, drapedin the usual sterile fashion using maximum barrier technique (cap and mask, sterile gown, sterile gloves, large sterile sheet, hand hygiene and cutaneous antisepsis) and  infiltrated locally with 1% Lidocaine.  Ultrasound demonstrated patency of the left jugular vein, and this was documented with an image. Under real-time ultrasound guidance, this vein was accessed with a 21 gauge micropuncture needle and image documentation was performed. A long tract was utilized to create a tunnel. A 0.018 wire was introduced in to the vein. Over this, a 5 [place] single lumen Power tunneled PICC was advanced to the lower SVC/right atrial junction. The cuff was positioned in the subcutaneous tract. Fluoroscopy during the procedure and fluoro spot radiograph confirms appropriate catheter position. The catheter was flushed and covered with asterile dressing.  Catheter length: 24 cm  COMPLICATIONS: None  IMPRESSION: Successful left jugular tunneled power PICC line placement with ultrasound and fluoroscopic guidance. The catheter is ready for use.   Electronically Signed   By: Jolaine Click M.D.   On: 05/04/2015 08:07    CBC  Recent Labs Lab 04/30/15 0123  05/02/15 1848 05/03/15 0342 05/04/15 1018 05/05/15 0534 05/06/15 0435  WBC 18.2*  < > 15.6* 16.3* 12.1* 12.6* 11.2*  HGB 10.7*  < > 9.0* 9.4* 8.4* 8.0* 7.8*  HCT 32.5*  < > 28.6* 28.6* 26.4* 25.4* 25.0*  PLT 353  < > 412* 440* 358 422* 456*  MCV 75.1*  < > 76.1* 75.1* 75.6* 75.8* 76.9*  MCH 24.7*  < > 23.9* 24.7* 24.1* 23.9* 24.0*  MCHC 32.9  < > 31.5 32.9 31.8 31.5 31.2  RDW 14.1  < > 15.0 15.1 15.1 15.3 15.5  LYMPHSABS 0.0*  --   --   --   --   --   --   MONOABS 0.0*  --   --   --   --   --   --   EOSABS 0.0  --   --   --   --   --   --   BASOSABS 0.0  --   --   --   --   --   --   < > = values in this interval not displayed.  Chemistries   Recent Labs Lab 04/30/15 0123  05/02/15 1848 05/03/15 0342 05/04/15 0728 05/05/15 0534 05/06/15 0435  NA 121*  < > 124* 127* 127* 129* 133*  K 4.2  < > 4.2 4.1 4.1 4.3 4.0  CL 86*  < > 88* 92*  86* 87* 91*  CO2 23  < > 21* 19* 26 28 30   GLUCOSE 438*  < > 224* 138* 148* 115*  158*  BUN 18  < > 48* 53* 62* 63* 64*  CREATININE 1.05*  < > 4.80* 5.17* 5.89* 5.81* 5.58*  CALCIUM 8.3*  < > 7.5* 7.7* 7.2* 7.2* 7.2*  AST 19  --   --   --   --   --   --   ALT 14  --   --   --   --   --   --   ALKPHOS 170*  --   --   --   --   --   --   BILITOT 0.7  --   --   --   --   --   --   < > = values in this interval not displayed. ------------------------------------------------------------------------------------------------------------------ estimated creatinine clearance is 23.1 mL/min (by C-G formula based on Cr of 5.58). ------------------------------------------------------------------------------------------------------------------ No results for input(s): HGBA1C in the last 72 hours. ------------------------------------------------------------------------------------------------------------------ No results for input(s): CHOL, HDL, LDLCALC, TRIG, CHOLHDL, LDLDIRECT in the last 72 hours. ------------------------------------------------------------------------------------------------------------------ No results for input(s): TSH, T4TOTAL, T3FREE, THYROIDAB in the last 72 hours.  Invalid input(s): FREET3 ------------------------------------------------------------------------------------------------------------------ No results for input(s): VITAMINB12, FOLATE, FERRITIN, TIBC, IRON, RETICCTPCT in the last 72 hours.  Coagulation profile No results for input(s): INR, PROTIME in the last 168 hours.  No results for input(s): DDIMER in the last 72 hours.  Cardiac Enzymes No results for input(s): CKMB, TROPONINI, MYOGLOBIN in the last 168 hours.  Invalid input(s): CK ------------------------------------------------------------------------------------------------------------------ Invalid input(s): POCBNP   Recent Labs  05/04/15 2147 05/05/15 0623 05/05/15 1145 05/05/15 1643 05/05/15 2201 05/06/15 0644  GLUCAP 108* 112* 158* 152* 143* 164*     Lindsay [name]  A M.D. [facility] 05/06/2015, 8:09 AM  Pager: [phone]  Between 7am to 7pm - call Pager - [phone]  After 7pm go to [email or web address] - password [email or web address]  Call night coverage person covering after 7pm

## 2015-05-06 NOTE — Progress Notes (Signed)
Physical Therapy Treatment Patient Details Name: Lindsay [name] MRN: [id number] DOB: 30-Dec-1980 Today's Date: 05/06/2015    History of Present Illness Pt is s/p I&D of the right shoulder with arthroscopy and I&D of the right chest wall abscess as well, on 04-30-2015. PMH includes: obesity, asthma, diabetes, hypertension, acute renal failure, septic arthritis.     PT Comments    Patient requires increased time and encouragement for mobility. Stated B feet were sore and limiting ambulation. Encouraged ambulation and OOB activity as much as possible. Continue with current POC  Follow Up Recommendations  Home health PT;Supervision/Assistance - 24 hour     Equipment Recommendations  Rolling walker with 5" wheels    Recommendations for Other Services       Precautions / Restrictions Precautions Precautions: Fall Precaution Comments: skin breakdown under breadts;groin;under pannus Required Braces or Orthoses: Sling Restrictions Weight Bearing Restrictions: No RUE Weight Bearing: Weight bearing as tolerated Other Position/Activity Restrictions: ROM as tolerated    Mobility  Bed Mobility Overal bed mobility: Needs Assistance Bed Mobility: Sit to Supine     Supine to sit: Min assist     General bed mobility comments: Up before and after session  Transfers Overall transfer level: Needs assistance Equipment used: None Transfers: Sit to/from Stand Sit to Stand: Min assist         General transfer comment: educated on positioning toward edge of chair and using momentum  Ambulation/Gait Ambulation/Gait assistance: Min guard Ambulation Distance (Feet): 50 Feet Assistive device: None (IV pole) Gait Pattern/deviations: Wide base of support;Step-through pattern;Shuffle Gait velocity: very slow Gait velocity interpretation: Below normal speed for age/gender General Gait Details: Pt using IV pole for support. Stops every couple of steps to rest   Stairs             Wheelchair Mobility    Modified Rankin (Stroke Patients Only)       Balance Overall balance assessment: Needs assistance           Standing balance-Leahy Scale: Fair Standing balance comment: Pt more comfortable holding onto IV pole when walking                    Cognition Arousal/Alertness: Awake/alert Behavior During Therapy: Flat affect Overall Cognitive Status: Within Functional Limits for tasks assessed                      Exercises Shoulder Exercises Pendulum Exercise: AAROM;Right;10 reps;Standing Shoulder Flexion: AAROM;Supine;Other (comment) (to around 30 FF) Shoulder ABduction: Other (comment) (unable due to body habitus. needs dowel) Shoulder External Rotation: Right;AAROM (able to get to neutral. Need to use dowel) Elbow Flexion: AAROM;Both;15 reps Elbow Extension: AAROM;Right;15 reps Wrist Flexion: AROM;Right;15 reps Wrist Extension: AROM;Right;15 reps Digit Composite Flexion: AROM;Right;10 reps Composite Extension: AROM;Right;10 reps Other Exercises Other Exercises: squeeze ball Other Exercises: table slides Other Exercises: lap slides    General Comments        Pertinent Vitals/Pain Pain Assessment: Faces Faces Pain Scale: Hurts little more Pain Location: R shoulder Pain Descriptors / Indicators: Aching Pain Intervention(s): Monitored during session    Home Living                      Prior Function            PT Goals (current goals can now be found in the care plan section) Acute Rehab PT Goals Patient Stated Goal: to go home Progress towards PT goals: Progressing toward goals  Frequency  Min 3X/week    PT Plan Current plan remains appropriate    Co-evaluation             End of Session   Activity Tolerance: Patient tolerated treatment well;Patient limited by fatigue Patient left: in chair;with call bell/phone within reach     Time: 0937-1000 PT Time Calculation (min) (ACUTE ONLY): 23  min  Charges:  $Gait Training: 23-37 mins                    G Codes:      Fredrich Birks 05/06/2015, 1:13 PM 05/06/2015 Fredrich Birks PTA [phone] pager [phone] office

## 2015-05-06 NOTE — Progress Notes (Signed)
ANTIBIOTIC CONSULT NOTE - FOLLOW UP  Pharmacy Consult for Zosyn Indication: shoulder infection  No Known Allergies  Patient Measurements: Height:  (172.7 cm) Weight: (!) 352 lb (159.666 kg) IBW/kg (Calculated) : 63.9 Adjusted Body Weight:   Vital Signs: Temp: 98.5 F (36.9 C) (05/16 0535) BP: 115/62 mmHg (05/16 0535) Pulse Rate: 92 (05/16 0535) Intake/Output from previous day: 05/15 0701 - 05/16 0700 In: 720 [P.O.:720] Out: 1975 [Urine:1975] Intake/Output from this shift: Total I/O In: 240 [P.O.:240] Out: 600 [Urine:600]  Labs:  Recent Labs  05/04/15 0728 05/04/15 1018 05/05/15 0534 05/06/15 0435  WBC  --  12.1* 12.6* 11.2*  HGB  --  8.4* 8.0* 7.8*  PLT  --  358 422* 456*  CREATININE 5.89*  --  5.81* 5.58*   Estimated Creatinine Clearance: 23.1 mL/min (by C-G formula based on Cr of 5.58). No results for input(s): VANCOTROUGH, VANCOPEAK, VANCORANDOM, GENTTROUGH, GENTPEAK, GENTRANDOM, TOBRATROUGH, TOBRAPEAK, TOBRARND, AMIKACINPEAK, AMIKACINTROU, AMIKACIN in the last 72 hours.   Microbiology: Recent Results (from the past 720 hour(s))  Culture, routine-abscess     Status: None (Preliminary result)   Collection Time: 04/27/15  6:36 AM  Result Value Ref Range Status   Specimen Description ABSCESS RIGHT SHOULDER  Final   Special Requests NONE  Final   Gram Stain   Final    FEW WBC PRESENT,BOTH PMN AND MONONUCLEAR RARE GRAM NEGATIVE RODS Performed at [facility]    Culture   Final    NO GROWTH Note: CONTINUING TO HOLD FOR 14 DAYS Performed at [facility]    Report Status PENDING  Incomplete  Surgical pcr screen     Status: Abnormal   Collection Time: 04/30/15  3:23 PM  Result Value Ref Range Status   MRSA, PCR NEGATIVE NEGATIVE Final   Staphylococcus aureus POSITIVE (A) NEGATIVE Final    Comment:        The Xpert SA Assay (FDA approved for NASAL specimens in patients over 61 years of age), is one component of a comprehensive  surveillance program.  Test performance has been validated by [facility] for patients greater than or equal to 69 year old. It is not intended to diagnose infection nor to guide or monitor treatment.   Anaerobic culture     Status: None (Preliminary result)   Collection Time: 04/30/15  7:41 PM  Result Value Ref Range Status   Specimen Description WOUND RIGHT SHOULDER  Final   Special Requests RIGHT SHOULDER  Final   Gram Stain PENDING  Incomplete   Culture   Final    No Anaerobes Isolated; Culture in Progress for 14 DAYS Performed at [facility]    Report Status PENDING  Incomplete  Culture, routine-abscess     Status: None   Collection Time: 04/30/15  7:41 PM  Result Value Ref Range Status   Specimen Description ABSCESS RIGHT SHOULDER  Final   Special Requests PATIENT ON FOLLOWING VANCOMYCIN ZOSYN  Final   Gram Stain   Final    NO WBC SEEN NO SQUAMOUS EPITHELIAL CELLS SEEN NO ORGANISMS SEEN Performed at [facility]    Culture   Final    NO GROWTH 3 DAYS Performed at [facility]    Report Status 05/04/2015 FINAL  Final  Anaerobic culture     Status: None   Collection Time: 04/30/15  7:47 PM  Result Value Ref Range Status   Specimen Description ABSCESS  DRAINAGE FROM RIGHT SHOULDER  Final   Special  Requests PATIENT ON FOLLOWING VANCOMYCIN ZOSYN B  Final   Gram Stain   Final    NO WBC SEEN NO SQUAMOUS EPITHELIAL CELLS SEEN NO ORGANISMS SEEN Performed at [facility]    Culture   Final    NO ANAEROBES ISOLATED Performed at [facility]    Report Status 05/05/2015 FINAL  Final  Culture, routine-abscess     Status: None   Collection Time: 04/30/15  7:47 PM  Result Value Ref Range Status   Specimen Description ABSCESS RIGHT SHOULDER DRAINAGE  Final   Special Requests   Final    PATIENT ON FOLLOWING VANCOMYCIN ZOSYN SPECIMEN B 2 CUPS   Gram Stain   Final    NO WBC SEEN NO SQUAMOUS EPITHELIAL CELLS SEEN NO ORGANISMS  SEEN Performed at [facility]    Culture   Final    NO GROWTH 3 DAYS Performed at [facility]    Report Status 05/04/2015 FINAL  Final  Anaerobic culture     Status: None (Preliminary result)   Collection Time: 04/30/15  7:48 PM  Result Value Ref Range Status   Specimen Description FLUID RIGHT SHOULDER JOINT  Final   Special Requests PATIENT ON FOLLOWING VANCOMYCIN ZOSYN C  Final   Gram Stain PENDING  Incomplete   Culture   Final    No Anaerobes Isolated; Culture in Progress for 14 DAYS Performed at [facility]    Report Status PENDING  Incomplete  Body fluid culture     Status: None   Collection Time: 04/30/15  7:48 PM  Result Value Ref Range Status   Specimen Description FLUID RIGHT SHOULDER JOINT  Final   Special Requests PATIENT ON FOLLOWING VANC,ZOSYN  Final   Gram Stain   Final    RARE WBC PRESENT, PREDOMINANTLY PMN NO ORGANISMS SEEN Performed at [facility]    Culture   Final    NO GROWTH 3 DAYS Performed at [facility]    Report Status 05/04/2015 FINAL  Final    Anti-infectives    Start     Dose/Rate Route Frequency Ordered Stop   05/06/15 0900  fluconazole (DIFLUCAN) tablet 150 mg     150 mg Oral  Once 05/06/15 0840     05/03/15 2200  piperacillin-tazobactam (ZOSYN) IVPB 2.25 g     2.25 g 100 mL/hr over 30 Minutes Intravenous 3 times per day 05/03/15 1514     05/03/15 1000  linezolid (ZYVOX) IVPB 600 mg     600 mg 300 mL/hr over 60 Minutes Intravenous Every 12 hours 05/03/15 0752     05/03/15 0800  DAPTOmycin (CUBICIN) 1,000 mg in sodium chloride 0.9 % IVPB  Status:  Discontinued     1,000 mg 240 mL/hr over 30 Minutes Intravenous Every 48 hours 05/02/15 1515 05/03/15 0751   05/01/15 0200  cefTRIAXone (ROCEPHIN) 2 g in dextrose 5 % 50 mL IVPB - Premix  Status:  Discontinued     2 g 100 mL/hr over 30 Minutes Intravenous Every 24 hours 04/30/15 0427 04/30/15 0808   04/30/15 1900  ceFAZolin (ANCEF) 3 g in dextrose 5  % 50 mL IVPB     3 g 160 mL/hr over 30 Minutes Intravenous  Once 04/30/15 1854 04/30/15 1946   04/30/15 1200  vancomycin (VANCOCIN) 1,500 mg in sodium chloride 0.9 % 500 mL IVPB  Status:  Discontinued     1,500 mg 250 mL/hr over 120 Minutes Intravenous Every 8 hours 04/30/15 0436  05/01/15 1350   04/30/15 0830  piperacillin-tazobactam (ZOSYN) IVPB 3.375 g  Status:  Discontinued     3.375 g 12.5 mL/hr over 240 Minutes Intravenous 3 times per day 04/30/15 0824 05/03/15 1514   04/30/15 0445  vancomycin (VANCOCIN) 1,500 mg in sodium chloride 0.9 % 500 mL IVPB     1,500 mg 250 mL/hr over 120 Minutes Intravenous  Once 04/30/15 0436 04/30/15 0707   04/30/15 0315  cefTRIAXone (ROCEPHIN) 2 g in dextrose 5 % 50 mL IVPB     2 g 100 mL/hr over 30 Minutes Intravenous  Once 04/30/15 0312 04/30/15 0433   04/30/15 0115  vancomycin (VANCOCIN) IVPB 1000 mg/200 mL premix     1,000 mg 200 mL/hr over 60 Minutes Intravenous  Once 04/30/15 0111 04/30/15 0302      Assessment: 33yo female with shoulder infection and AKI.  All cx are negative to date. Her SrCr remains elevated at 5.58 but trending down.  Goal of Therapy:  Resolution of infection  Plan:  Cont Zosyn to 2.25g IV q8 Monitor renal fxn and adjust as needed. F/U cx results  Thanks for allowing pharmacy to be a part of this patient's care.  Talbert Cage, PharmD Clinical Pharmacist, [phone]

## 2015-05-06 NOTE — Progress Notes (Signed)
Occupational Therapy Treatment Patient Details Name: Lindsay [name] MRN: [id number] DOB: September 30, 1981 Today's Date: 05/06/2015    History of present illness Pt is s/p I&D of the right shoulder with arthroscopy and I&D of the right chest wall abscess as well, on 04-30-2015. PMH includes: obesity, asthma, diabetes, hypertension, acute renal failure, septic arthritis.    OT comments  Per ortho note, pt WBAT and ROM as tolerated. Pt seen for second session to begin exercise RUE. Pt appeared very apprehensive at beginning of session, but made good progress in attempting to complete RUE A/AAROM. Pt began pendulums, lap slides and AAROM FF,ER,ABD in supine. Will follow to address established goals. Pt will need to be seen daily if possible to maximize independence with ADL and increase functional use of RUE as pt is Medicaid and lives alone with her 34 yo daughter.   Follow Up Recommendations  Supervision/Assistance - 24 hour;Home health OT    Equipment Recommendations  Tub/shower seat    Recommendations for Other Services      Precautions / Restrictions Precautions Precautions: Fall Precaution Comments: skin breakdown under breadts;groin;under pannus Required Braces or Orthoses: Sling Restrictions Weight Bearing Restrictions: No RUE Weight Bearing: Weight bearing as tolerated Other Position/Activity Restrictions: ROM as tolerated         Cognition   Behavior During Therapy: Flat affect Overall Cognitive Status: Within Functional Limits for tasks assessed                       Extremity/Trunk Assessment   RUE proximal weakness. Needs aggressive therapy.            Exercises Shoulder Exercises Pendulum Exercise: AAROM;Right;10 reps;Standing Shoulder Flexion: AAROM;Supine;Other (comment) (to around 30 FF) Shoulder ABduction: Other (comment) (unable due to body habitus. needs dowel) Shoulder External Rotation: Right;AAROM (able to get to neutral. Need to use  dowel) Elbow Flexion: AAROM;Both;15 reps Elbow Extension: AAROM;Right;15 reps Wrist Flexion: AROM;Right;15 reps Wrist Extension: AROM;Right;15 reps Digit Composite Flexion: AROM;Right;10 reps Composite Extension: AROM;Right;10 reps Other Exercises Other Exercises: squeeze ball Other Exercises: table slides Other Exercises: lap slides          General Comments  Pt appreciative of help.    Pertinent Vitals/ Pain       Pain Assessment: Faces Faces Pain Scale: Hurts little more Pain Location: r shoulder Pain Descriptors / Indicators: Aching Pain Intervention(s): Limited activity within patient's tolerance;Repositioned;Ice applied  Home Living                                          Prior Functioning/Environment              Frequency Min 3X/week     Progress Toward Goals  OT Goals(current goals can now be found in the care plan section)  Progress towards OT goals: Progressing toward goals  Acute Rehab OT Goals Patient Stated Goal: to go home OT Goal Formulation: With patient Time For Goal Achievement: 05/20/15 Potential to Achieve Goals: Good ADL Goals Pt Will Perform Upper Body Bathing: with modified independence;with adaptive equipment;sitting Pt Will Perform Lower Body Bathing: with supervision;with adaptive equipment;sit to/from stand Pt Will Perform Upper Body Dressing: with adaptive equipment;sitting;with min guard assist Pt Will Perform Lower Body Dressing: with min assist;with adaptive equipment;sit to/from stand Pt Will Transfer to Toilet: with min guard assist;ambulating;regular height toilet;grab bars Pt Will Perform Tub/Shower Transfer: with min  guard assist;ambulating;shower seat Pt/caregiver will Perform Home Exercise Program: Right Upper extremity;With written HEP provided  Plan Discharge plan remains appropriate;Frequency needs to be updated    Co-evaluation                 End of Session Equipment Utilized During  Treatment: Other (comment) (IV pole)   Activity Tolerance Patient tolerated treatment well   Patient Left in bed;with call bell/phone within reach;with nursing/sitter in room   Nurse Communication Mobility status;Precautions;Weight bearing status        Time: 6213-0865 OT Time Calculation (min): 18 min  Charges: OT General Charges $OT Visit: 1 Procedure OT Treatments  $Therapeutic Exercise: 8-22 mins  Virdie Penning,HILLARY 05/06/2015, 12:34 PM   [facility], OTR/L  [phone] 05/06/2015

## 2015-05-07 ENCOUNTER — Inpatient Hospital Stay (HOSPITAL_COMMUNITY): Payer: No Typology Code available for payment source

## 2015-05-07 ENCOUNTER — Encounter (HOSPITAL_COMMUNITY): Payer: Medicaid Other

## 2015-05-07 DIAGNOSIS — M7989 Other specified soft tissue disorders: Secondary | ICD-10-CM

## 2015-05-07 LAB — CBC
HCT: 24.7 % — ABNORMAL LOW (ref 36.0–46.0)
Hemoglobin: 7.7 g/dL — ABNORMAL LOW (ref 12.0–15.0)
MCH: 24.1 pg — ABNORMAL LOW (ref 26.0–34.0)
MCHC: 31.2 g/dL (ref 30.0–36.0)
MCV: 77.2 fL — AB (ref 78.0–100.0)
Platelets: 501 10*3/uL — ABNORMAL HIGH (ref 150–400)
RBC: 3.2 MIL/uL — ABNORMAL LOW (ref 3.87–5.11)
RDW: 15.5 % (ref 11.5–15.5)
WBC: 12.3 10*3/uL — ABNORMAL HIGH (ref 4.0–10.5)

## 2015-05-07 LAB — RENAL FUNCTION PANEL
Albumin: 1.4 g/dL — ABNORMAL LOW (ref 3.5–5.0)
Anion gap: 12 (ref 5–15)
BUN: 59 mg/dL — ABNORMAL HIGH (ref 6–20)
CO2: 29 mmol/L (ref 22–32)
CREATININE: 5.09 mg/dL — AB (ref 0.44–1.00)
Calcium: 7.3 mg/dL — ABNORMAL LOW (ref 8.9–10.3)
Chloride: 91 mmol/L — ABNORMAL LOW (ref 101–111)
GFR calc non Af Amer: 10 mL/min — ABNORMAL LOW (ref >60)
GFR, EST AFRICAN AMERICAN: 12 mL/min — AB (ref >60)
Glucose, Bld: 182 mg/dL — ABNORMAL HIGH (ref 65–99)
PHOSPHORUS: 5.2 mg/dL — AB (ref 2.5–4.6)
POTASSIUM: 4.4 mmol/L (ref 3.5–5.1)
Sodium: 132 mmol/L — ABNORMAL LOW (ref 135–145)

## 2015-05-07 LAB — GLUCOSE, CAPILLARY
Glucose-Capillary: 169 mg/dL — ABNORMAL HIGH (ref 65–99)
Glucose-Capillary: 168 mg/dL — ABNORMAL HIGH (ref 65–99)
Glucose-Capillary: 110 mg/dL — ABNORMAL HIGH (ref 65–99)
Glucose-Capillary: 192 mg/dL — ABNORMAL HIGH (ref 65–99)

## 2015-05-07 LAB — IRON AND TIBC
IRON: 29 ug/dL (ref 28–170)
SATURATION RATIOS: 15 % (ref 10.4–31.8)
TIBC: 199 ug/dL — ABNORMAL LOW (ref 250–450)
UIBC: 170 ug/dL

## 2015-05-07 MED ORDER — SODIUM CHLORIDE 0.9 % IV SOLN
300.0000 mg | Freq: Two times a day (BID) | INTRAVENOUS | Status: DC
  Administered 2015-05-07 – 2015-05-09 (×4): 300 mg via INTRAVENOUS
  Filled 2015-05-07 (×5): qty 300

## 2015-05-07 NOTE — Progress Notes (Signed)
INFECTIOUS DISEASE PROGRESS NOTE  ID: Lindsay [name] is a 34 y.o. 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 Also pain in her R shoulder.    Abtx:  Anti-infectives    Start     Dose/Rate Route Frequency Ordered Stop   05/06/15 0900  fluconazole (DIFLUCAN) tablet 150 mg     150 mg Oral  Once 05/06/15 0840 05/06/15 1234   05/03/15 2200  piperacillin-tazobactam (ZOSYN) IVPB 2.25 g     2.25 g 100 mL/hr over 30 Minutes Intravenous 3 times per day 05/03/15 1514     05/03/15 1000  linezolid (ZYVOX) IVPB 600 mg  Status:  Discontinued     600 mg 300 mL/hr over 60 Minutes Intravenous Every 12 hours 05/03/15 0752 05/06/15 1609   05/03/15 0800  DAPTOmycin (CUBICIN) 1,000 mg in sodium chloride 0.9 % IVPB  Status:  Discontinued     1,000 mg 240 mL/hr over 30 Minutes Intravenous Every 48 hours 05/02/15 1515 05/03/15 0751   05/01/15 0200  cefTRIAXone (ROCEPHIN) 2 g in dextrose 5 % 50 mL IVPB - Premix  Status:  Discontinued     2 g 100 mL/hr over 30 Minutes Intravenous Every 24 hours 04/30/15 0427 04/30/15 0808   04/30/15 1900  ceFAZolin (ANCEF) 3 g in dextrose 5 % 50 mL IVPB     3 g 160 mL/hr over 30 Minutes Intravenous  Once 04/30/15 1854 04/30/15 1946   04/30/15 1200  vancomycin (VANCOCIN) 1,500 mg in sodium chloride 0.9 % 500 mL IVPB  Status:  Discontinued     1,500 mg 250 mL/hr over 120 Minutes Intravenous Every 8 hours 04/30/15 0436 05/01/15 1350   04/30/15 0830  piperacillin-tazobactam (ZOSYN) IVPB 3.375 g  Status:  Discontinued     3.375 g 12.5 mL/hr over 240 Minutes Intravenous 3 times per day 04/30/15 0824 05/03/15 1514   04/30/15 0445  vancomycin  (VANCOCIN) 1,500 mg in sodium chloride 0.9 % 500 mL IVPB     1,500 mg 250 mL/hr over 120 Minutes Intravenous  Once 04/30/15 0436 04/30/15 0707   04/30/15 0315  cefTRIAXone (ROCEPHIN) 2 g in dextrose 5 % 50 mL IVPB     2 g 100 mL/hr over 30 Minutes Intravenous  Once 04/30/15 0312 04/30/15 0433   04/30/15 0115  vancomycin (VANCOCIN) IVPB 1000 mg/200 mL premix     1,000 mg 200 mL/hr over 60 Minutes Intravenous  Once 04/30/15 0111 04/30/15 0302      Medications:  Scheduled: . bisacodyl  10 mg Rectal Once  . docusate sodium  100 mg Oral BID  . ferrous sulfate  325 mg Oral BID WC  . gabapentin  100 mg Oral TID  . heparin subcutaneous  5,000 Units Subcutaneous 3 times per day  . insulin aspart  0-20 Units Subcutaneous TID WC  . insulin aspart  0-5 Units Subcutaneous QHS  . insulin aspart  10 Units Subcutaneous TID WC  . insulin glargine  60 Units Subcutaneous Daily  . nystatin   Topical TID  . piperacillin-tazobactam (ZOSYN)  IV  2.25 g Intravenous 3 times per day  . polyethylene glycol  17  g Oral Daily  . Vitamin D (Ergocalciferol)  50,000 Units Oral Q7 days    Objective: Vital signs in last 24 hours: Temp:  [98.1 F (36.7 C)-99.2 F (37.3 C)] 98.1 F (36.7 C) (05/17 1300) Pulse Rate:  [90-108] 90 (05/17 1300) Resp:  [17-18] 18 (05/17 1300) BP: (110-137)/(59-99) 133/71 mmHg (05/17 1300) SpO2:  [98 %-100 %] 98 % (05/17 1300)   General appearance: alert, cooperative and no distress Extremities: edema anasarca and R shoulder dressed.   Lab Results  Recent Labs  05/06/15 0435 05/07/15 0443  WBC 11.2* 12.3*  HGB 7.8* 7.7*  HCT 25.0* 24.7*  NA 133* 132*  K 4.0 4.4  CL 91* 91*  CO2 30 29  BUN 64* 59*  CREATININE 5.58* 5.09*   Liver Panel  Recent Labs  05/06/15 0435 05/07/15 0443  ALBUMIN 1.4* 1.4*   Sedimentation Rate No results for input(s): ESRSEDRATE in the last 72 hours. C-Reactive Protein No results for input(s): CRP in the last 72  hours.  Microbiology: Recent Results (from the past 240 hour(s))  Surgical pcr screen     Status: Abnormal   Collection Time: 04/30/15  3:23 PM  Result Value Ref Range Status   MRSA, PCR NEGATIVE NEGATIVE Final   Staphylococcus aureus POSITIVE (A) NEGATIVE Final    Comment:        The Xpert SA Assay (FDA approved for NASAL specimens in patients over 45 years of age), is one component of a comprehensive surveillance program.  Test performance has been validated by [facility] for patients greater than or equal to 87 year old. It is not intended to diagnose infection nor to guide or monitor treatment.   Anaerobic culture     Status: None (Preliminary result)   Collection Time: 04/30/15  7:41 PM  Result Value Ref Range Status   Specimen Description WOUND RIGHT SHOULDER  Final   Special Requests RIGHT SHOULDER  Final   Gram Stain PENDING  Incomplete   Culture   Final    No Anaerobes Isolated; Culture in Progress for 14 DAYS Performed at [facility]    Report Status PENDING  Incomplete  Culture, routine-abscess     Status: None   Collection Time: 04/30/15  7:41 PM  Result Value Ref Range Status   Specimen Description ABSCESS RIGHT SHOULDER  Final   Special Requests PATIENT ON FOLLOWING VANCOMYCIN ZOSYN  Final   Gram Stain   Final    NO WBC SEEN NO SQUAMOUS EPITHELIAL CELLS SEEN NO ORGANISMS SEEN Performed at [facility]    Culture   Final    NO GROWTH 3 DAYS Performed at [facility]    Report Status 05/04/2015 FINAL  Final  Anaerobic culture     Status: None   Collection Time: 04/30/15  7:47 PM  Result Value Ref Range Status   Specimen Description ABSCESS  DRAINAGE FROM RIGHT SHOULDER  Final   Special Requests PATIENT ON FOLLOWING VANCOMYCIN ZOSYN B  Final   Gram Stain   Final    NO WBC SEEN NO SQUAMOUS EPITHELIAL CELLS SEEN NO ORGANISMS SEEN Performed at [facility]    Culture   Final    NO ANAEROBES ISOLATED Performed at  [facility]    Report Status 05/05/2015 FINAL  Final  Culture, routine-abscess     Status: None   Collection Time: 04/30/15  7:47 PM  Result Value Ref Range Status   Specimen Description ABSCESS RIGHT SHOULDER DRAINAGE  Final   Special Requests   Final    PATIENT ON FOLLOWING VANCOMYCIN ZOSYN SPECIMEN B 2 CUPS   Gram Stain   Final    NO WBC SEEN NO SQUAMOUS EPITHELIAL CELLS SEEN NO ORGANISMS SEEN Performed at [facility]    Culture   Final    NO GROWTH 3 DAYS Performed at [facility]    Report Status 05/04/2015 FINAL  Final  Anaerobic culture     Status: None (Preliminary result)   Collection Time: 04/30/15  7:48 PM  Result Value Ref Range Status   Specimen Description FLUID RIGHT SHOULDER JOINT  Final   Special Requests PATIENT ON FOLLOWING VANCOMYCIN ZOSYN C  Final   Gram Stain   Final    RARE WBC PRESENT, PREDOMINANTLY PMN NO ORGANISMS SEEN Performed at [facility]   Final    No Anaerobes Isolated; Culture in Progress for 14 DAYS Performed at [facility]    Report Status PENDING  Incomplete  Body fluid culture     Status: None   Collection Time: 04/30/15  7:48 PM  Result Value Ref Range Status   Specimen Description FLUID RIGHT SHOULDER JOINT  Final   Special Requests PATIENT ON FOLLOWING VANC,ZOSYN  Final   Gram Stain   Final    RARE WBC PRESENT, PREDOMINANTLY PMN NO ORGANISMS SEEN Performed at [facility]    Culture   Final    NO GROWTH 3 DAYS Performed at [facility]    Report Status 05/04/2015 FINAL  Final    Studies/Results: No results found.   Assessment/Plan:  Septic arthritis (R shoulder), chest wall abscess, myositis Debrided 5-10  Cx (-) DM2 (since 2004) Acute Renal Failure [place] Obesity (BMI 53.53) LE edema Protein- albumin malnutrition, severe  Total days of antibiotics: 8 zosyn  would Change her anbx to ceftaroline Avoid use of cubucin for now  (CK still high) Cr is improving.          Johny Sax Infectious Diseases (pager) [phone] [email or web address] 05/07/2015, 3:45 PM  LOS: 7 days

## 2015-05-07 NOTE — Progress Notes (Signed)
[facility]                                                                              Patient Demographics  Lindsay [name], is a 34 y.o. female, DOB - Sep 26, 1981, ZOX:[id number]  Admit date - 04/30/2015   Admitting Physician Houston Siren, MD  Outpatient Primary MD for the patient is Doris Cheadle, MD  LOS - 7   Chief Complaint  Patient presents with  . Shoulder Pain       Brief HPI   The patient is a 34 year old female with obesity, asthma, diabetes, hypertension presented to ED with right shoulder worsening pain. Patient was recently seen in the ED and had a small I&D done in ER and was placed on doxycycline. Patient however continued to have increased pain but no subjective fevers or chills. Evaluation in ED included a marked leukocytosis with WBC of 18 K and elevated blood sugar in 400s. Patient was started on IV vancomycin and hospitalist was asked to admit for cellulitis and hyperglycemia.   Assessment & Plan    Cellulitis of right shoulder and chest wall with abscess  -postop day # 7 - MRI of the right shoulder which showed severe muscle edema with small 5 x 6x 25 mm fluid collection overall appearance concerning for myositis due to infectious or inflammatory etiology, large glenohumeral joint effusion, possible septic arthritis - underwent irrigation and debridement of the right shoulder with arthroscopy and patient was also found to have right chest wall subcutaneous abscess and Dr. Derrell Lolling was consulted intraoperatively. Patient underwent I&D of the right chest wall abscess as well, on 04-30-2015. - Creatinine function noticed to be trending up, vancomycin was discontinued. ID consult was obtained, patient seen by Dr. Ninetta Lights on 5/12, recommended to change to Zyvox and Zosyn. Zyvox stopped 5-16 due to concern for neuropathy.  -day 7 of IV antibiotics.  -PT and OT consulted. needs HH.  -follow up ID recommendations regarding length of antibiotics.    Encephalopathy;Suspect multifactorial; metabolic acidosis, renal failure, infectious process.   Decreased  gabapentin to 100 mg TID due to renal failure.   Diabetes mellitus due to underlying condition :  - Blood sugars controlled, monitor blood sugars closely due to acute kidney injury worsening.  - Continue with  lantus to 60 units to avoid hypoglycemia. In setting of renal failure.   Acute kidney injury: - Creatinine stable at 5, good urine out put yesterday 3.0 L -Vancomycin was discontinued and renal consult was obtained, renal ultrasound showed no renal obstruction. -fluids discontinue -Cr decrease from 5.8 to 5.0.   Lower extremity edema;  Doppler pending.  Fluids discontinue.   Right upper extremity swelling - doppler negative for DVT  Obesity - Patient counseled on diet and weight control  HTN (hypertension) -Currently BP borderline soft, not on any antihypertensive  Hyponatremia: Improving, related to renal failure and pseudohyponatremia from hyperglycemia.   Patient also had some bleeding from the site of heparin shots - no significant bleeding. Resume heparin.   Skin, maceration, skin candidiasis;  Nystatin powder ordered.  Wound care nurse.    Code Status: Full code   Family Communication:  Discussed in detail with the patient, all imaging results, lab results explained to the patient.   Disposition Plan: Not medically ready   Time Spent in minutes   25 minutes  Procedures  Irrigation and debridement of the right shoulder   I&D of the chest wall 5-10  Consults   Orthopedics, Dr. Eulah Pont   general surgery, Dr. Derrell Lolling  infectious disease Nephrology  DVT Prophylaxis SCDs . No significant bleeding, start heparin.   Medications  Scheduled Meds: . bisacodyl  10 mg Rectal Once  . docusate sodium  100 mg Oral BID  . ferrous sulfate  325 mg Oral BID WC  . gabapentin  100 mg Oral TID  . heparin subcutaneous  5,000 Units Subcutaneous 3 times per  day  . insulin aspart  0-20 Units Subcutaneous TID WC  . insulin aspart  0-5 Units Subcutaneous QHS  . insulin aspart  10 Units Subcutaneous TID WC  . insulin glargine  60 Units Subcutaneous Daily  . nystatin   Topical TID  . piperacillin-tazobactam (ZOSYN)  IV  2.25 g Intravenous 3 times per day  . polyethylene glycol  17 g Oral Daily  . Vitamin D (Ergocalciferol)  50,000 Units Oral Q7 days   Continuous Infusions:   PRN Meds:.acetaminophen, alum & mag hydroxide-simeth, HYDROmorphone (DILAUDID) injection, metoCLOPramide **OR** metoCLOPramide (REGLAN) injection, ondansetron **OR** ondansetron (ZOFRAN) IV, oxyCODONE-acetaminophen, sodium chloride   Antibiotics   Anti-infectives    Start     Dose/Rate Route Frequency Ordered Stop   05/06/15 0900  fluconazole (DIFLUCAN) tablet 150 mg     150 mg Oral  Once 05/06/15 0840 05/06/15 1234   05/03/15 2200  piperacillin-tazobactam (ZOSYN) IVPB 2.25 g     2.25 g 100 mL/hr over 30 Minutes Intravenous 3 times per day 05/03/15 1514     05/03/15 1000  linezolid (ZYVOX) IVPB 600 mg  Status:  Discontinued     600 mg 300 mL/hr over 60 Minutes Intravenous Every 12 hours 05/03/15 0752 05/06/15 1609   05/03/15 0800  DAPTOmycin (CUBICIN) 1,000 mg in sodium chloride 0.9 % IVPB  Status:  Discontinued     1,000 mg 240 mL/hr over 30 Minutes Intravenous Every 48 hours 05/02/15 1515 05/03/15 0751   05/01/15 0200  cefTRIAXone (ROCEPHIN) 2 g in dextrose 5 % 50 mL IVPB - Premix  Status:  Discontinued     2 g 100 mL/hr over 30 Minutes Intravenous Every 24 hours 04/30/15 0427 04/30/15 0808   04/30/15 1900  ceFAZolin (ANCEF) 3 g in dextrose 5 % 50 mL IVPB     3 g 160 mL/hr over 30 Minutes Intravenous  Once 04/30/15 1854 04/30/15 1946   04/30/15 1200  vancomycin (VANCOCIN) 1,500 mg in sodium chloride 0.9 % 500 mL IVPB  Status:  Discontinued     1,500 mg 250 mL/hr over 120 Minutes Intravenous Every 8 hours 04/30/15 0436 05/01/15 1350   04/30/15 0830   piperacillin-tazobactam (ZOSYN) IVPB 3.375 g  Status:  Discontinued     3.375 g 12.5 mL/hr over 240 Minutes Intravenous 3 times per day 04/30/15 0824 05/03/15 1514   04/30/15 0445  vancomycin (VANCOCIN) 1,500 mg in sodium chloride 0.9 % 500 mL IVPB     1,500 mg 250 mL/hr over 120 Minutes Intravenous  Once 04/30/15 0436 04/30/15 0707   04/30/15 0315  cefTRIAXone (ROCEPHIN) 2 g in dextrose 5 % 50 mL IVPB     2 g 100 mL/hr over 30 Minutes Intravenous  Once 04/30/15 0312 04/30/15 0433  04/30/15 0115  vancomycin (VANCOCIN) IVPB 1000 mg/200 mL premix     1,000 mg 200 mL/hr over 60 Minutes Intravenous  Once 04/30/15 0111 04/30/15 0302        Subjective:   Lindsay [name] was seen and examined today. Indigestion better. Right shoulder pain better than on admission. Still with pain on and off right shoulder, chest.     Objective:   Blood pressure 133/71, pulse 90, temperature 98.1 F (36.7 C), temperature source Oral, resp. rate 18, height 5\' 8"  (1.727 m), weight 159.666 kg (352 lb), last menstrual period 04/23/2015, SpO2 98 %.  Wt Readings from Last 3 Encounters:  04/30/15 159.666 kg (352 lb)  04/03/15 156.945 kg (346 lb)  02/13/15 154.949 kg (341 lb 9.6 oz)     Intake/Output Summary (Last 24 hours) at 05/07/15 1343 Last data filed at 05/07/15 1314  Gross per 24 hour  Intake    890 ml  Output   3200 ml  Net  -2310 ml    Exam  GeneralIs  NAD, alert  Neck: Supple, no JVD, no masses  CVS: S1 S2 auscultated, RRR  Respiratory Clear to auscultation bilaterally  Abdomen: Soft, nontender, nondistended, + bowel sounds, fungal infection, abdominal skin folds. Breast folds.   Ext: no cyanosis clubbing or edema, dressing intact on the right chest and shoulder . LE with plus 2 edema.   Skin:  dressing intact on the right chest and shoulder    Data Review   Micro Results Recent Results (from the past 240 hour(s))  Surgical pcr screen     Status: Abnormal   Collection  Time: 04/30/15  3:23 PM  Result Value Ref Range Status   MRSA, PCR NEGATIVE NEGATIVE Final   Staphylococcus aureus POSITIVE (A) NEGATIVE Final    Comment:        The Xpert SA Assay (FDA approved for NASAL specimens in patients over 98 years of age), is one component of a comprehensive surveillance program.  Test performance has been validated by [facility] for patients greater than or equal to 27 year old. It is not intended to diagnose infection nor to guide or monitor treatment.   Anaerobic culture     Status: None (Preliminary result)   Collection Time: 04/30/15  7:41 PM  Result Value Ref Range Status   Specimen Description WOUND RIGHT SHOULDER  Final   Special Requests RIGHT SHOULDER  Final   Gram Stain PENDING  Incomplete   Culture   Final    No Anaerobes Isolated; Culture in Progress for 14 DAYS Performed at [facility]    Report Status PENDING  Incomplete  Culture, routine-abscess     Status: None   Collection Time: 04/30/15  7:41 PM  Result Value Ref Range Status   Specimen Description ABSCESS RIGHT SHOULDER  Final   Special Requests PATIENT ON FOLLOWING VANCOMYCIN ZOSYN  Final   Gram Stain   Final    NO WBC SEEN NO SQUAMOUS EPITHELIAL CELLS SEEN NO ORGANISMS SEEN Performed at [facility]    Culture   Final    NO GROWTH 3 DAYS Performed at [facility]    Report Status 05/04/2015 FINAL  Final  Anaerobic culture     Status: None   Collection Time: 04/30/15  7:47 PM  Result Value Ref Range Status   Specimen Description ABSCESS  DRAINAGE FROM RIGHT SHOULDER  Final   Special Requests PATIENT ON FOLLOWING VANCOMYCIN ZOSYN B  Final   Gram Stain  Final    NO WBC SEEN NO SQUAMOUS EPITHELIAL CELLS SEEN NO ORGANISMS SEEN Performed at [facility]    Culture   Final    NO ANAEROBES ISOLATED Performed at [facility]    Report Status 05/05/2015 FINAL  Final  Culture, routine-abscess     Status: None   Collection  Time: 04/30/15  7:47 PM  Result Value Ref Range Status   Specimen Description ABSCESS RIGHT SHOULDER DRAINAGE  Final   Special Requests   Final    PATIENT ON FOLLOWING VANCOMYCIN ZOSYN SPECIMEN B 2 CUPS   Gram Stain   Final    NO WBC SEEN NO SQUAMOUS EPITHELIAL CELLS SEEN NO ORGANISMS SEEN Performed at [facility]    Culture   Final    NO GROWTH 3 DAYS Performed at [facility]    Report Status 05/04/2015 FINAL  Final  Anaerobic culture     Status: None (Preliminary result)   Collection Time: 04/30/15  7:48 PM  Result Value Ref Range Status   Specimen Description FLUID RIGHT SHOULDER JOINT  Final   Special Requests PATIENT ON FOLLOWING VANCOMYCIN ZOSYN C  Final   Gram Stain   Final    RARE WBC PRESENT, PREDOMINANTLY PMN NO ORGANISMS SEEN Performed at [facility]   Final    No Anaerobes Isolated; Culture in Progress for 14 DAYS Performed at [facility]    Report Status PENDING  Incomplete  Body fluid culture     Status: None   Collection Time: 04/30/15  7:48 PM  Result Value Ref Range Status   Specimen Description FLUID RIGHT SHOULDER JOINT  Final   Special Requests PATIENT ON FOLLOWING VANC,ZOSYN  Final   Gram Stain   Final    RARE WBC PRESENT, PREDOMINANTLY PMN NO ORGANISMS SEEN Performed at [facility]    Culture   Final    NO GROWTH 3 DAYS Performed at [facility]    Report Status 05/04/2015 FINAL  Final    Radiology Reports Dg Shoulder Right  04/19/2015   CLINICAL DATA:  Acute onset of right shoulder pain. Heard right shoulder pop 2 days ago. Initial encounter.  EXAM: RIGHT SHOULDER - 2+ VIEW  COMPARISON:  None.  FINDINGS: There is no evidence of fracture or dislocation. The right humeral head is seated within the glenoid fossa. The acromioclavicular joint is unremarkable in appearance. No significant soft tissue abnormalities are seen. The visualized portions of the right lung are clear.   IMPRESSION: No evidence of fracture or dislocation.   Electronically Signed   By: Roanna Raider M.D.   On: 04/19/2015 01:26   Ct Shoulder Right Wo Contrast  04/27/2015   CLINICAL DATA:  Constant right shoulder pain for 10 days  EXAM: CT OF THE RIGHT SHOULDER WITHOUT CONTRAST  TECHNIQUE: Multidetector CT imaging was performed according to the standard protocol. Multiplanar CT image reconstructions were also generated.  COMPARISON:  Radiographs 04/19/2015  FINDINGS: There is no fracture or dislocation. There is no bone lesion. There is mild osteolysis of the distal clavicle. This can be seen after trauma or with inflammatory arthropathies. It can be idiopathic. No soft tissue mass is evident. There is no abnormal fluid collection. Visible portions of the right ribs and chest wall appear unremarkable.  IMPRESSION: Osteolysis of the distal right clavicle, uncertain chronicity.   Electronically Signed   By: Ellery Plunk M.D.   On: 04/27/2015 03:43  US Renal  05/02/2015   CLINICAL DATA:  Acute renal failure  EXAM: RENAL / URINARY TRACT ULTRASOUND COMPLETE  COMPARISON:  None.  FINDINGS: Right Kidney:  Length: 13.4 cm. Echogenicity within normal limits. No mass or hydronephrosis visualized.  Left Kidney:  Length: 12.8 cm. Echogenicity within normal limits. No mass or hydronephrosis visualized.  Bladder:  Empty and therefore not well evaluated  IMPRESSION: Study limited by body habitus but appears normal.   Electronically Signed   By: Esperanza Heir M.D.   On: 05/02/2015 16:47   Mr Shoulder Right Wo Contrast  04/30/2015   CLINICAL DATA:  Right shoulder pain. Recent history of antibiotic use. Patient was in too much pain to continue with the contrasted portion of the exam.  EXAM: MRI OF THE RIGHT SHOULDER WITHOUT CONTRAST  TECHNIQUE: Multiplanar, multisequence MR imaging of the shoulder was performed. No intravenous contrast was administered.  COMPARISON:  None.  FINDINGS: Rotator cuff: Mild tendinosis of the  supraspinatus and infraspinatus tendon. Teres minor tendon is intact. Subscapularis tendon is intact.  Muscles: There is severe muscle edema in the subscapularis muscle with small 5 x 6 x 25 mm fluid collection. There is severe muscle edema in the anterior and posterior deltoid muscles with a 14 mm fluid collection in the deltoid muscle. There is soft tissue edema superficial to the subscapularis and infraspinatus muscles. There is mild muscle edema along the periphery of the teres minor muscle.  Biceps long head:  Intact.  Acromioclavicular Joint: Mild degenerative change of the acromioclavicular joint. Type II acromion.  Glenohumeral Joint: Large joint effusion.  No chondral defect.  Labrum:  Intact.  Bones: No focal marrow signal abnormality. No fracture or dislocation.  IMPRESSION: 1. Severe muscle edema in the subscapularis muscle with small 5 x 6 x 25 mm fluid collection. There is severe muscle edema in the anterior and posterior deltoid muscles with a 14 mm fluid collection in the deltoid muscle. Soft tissue edema superficial to the subscapularis and infraspinatus muscles. Mild muscle edema along the periphery of the teres minor muscle. The overall appearance is most concerning for myositis which may be secondary to an infectious or inflammatory etiology. The small fluid collections make the process more concerning for an infectious etiology. 2. Large glenohumeral joint effusion. Septic arthritis cannot be excluded.   Electronically Signed   By: Elige Ko   On: 04/30/2015 10:45   Ir US Guide Vasc Access Left  05/04/2015   CLINICAL DATA:  Cellulitis  EXAM: TUNNELED LEFT INTERNAL JUGULAR PICC LINE PLACEMENT WITH ULTRASOUND AND FLUOROSCOPIC GUIDANCE  FLUOROSCOPY TIME:  12 seconds.  PROCEDURE: The patient was advised of the possible risks andcomplications and agreed to undergo the procedure. The patient was then brought to the angiographic suite for the procedure.  The left neck was prepped with  chlorhexidine, drapedin the usual sterile fashion using maximum barrier technique (cap and mask, sterile gown, sterile gloves, large sterile sheet, hand hygiene and cutaneous antisepsis) and infiltrated locally with 1% Lidocaine.  Ultrasound demonstrated patency of the left jugular vein, and this was documented with an image. Under real-time ultrasound guidance, this vein was accessed with a 21 gauge micropuncture needle and image documentation was performed. A long tract was utilized to create a tunnel. A 0.018 wire was introduced in to the vein. Over this, a 5 [place] single lumen Power tunneled PICC was advanced to the lower SVC/right atrial junction. The cuff was positioned in the subcutaneous tract. Fluoroscopy during the procedure and fluoro  spot radiograph confirms appropriate catheter position. The catheter was flushed and covered with asterile dressing.  Catheter length: 24 cm  COMPLICATIONS: None  IMPRESSION: Successful left jugular tunneled power PICC line placement with ultrasound and fluoroscopic guidance. The catheter is ready for use.   Electronically Signed   By: Jolaine Click M.D.   On: 05/04/2015 08:07   Ir Fluoro Guide Cv Midline Picc Left  05/04/2015   CLINICAL DATA:  Cellulitis  EXAM: TUNNELED LEFT INTERNAL JUGULAR PICC LINE PLACEMENT WITH ULTRASOUND AND FLUOROSCOPIC GUIDANCE  FLUOROSCOPY TIME:  12 seconds.  PROCEDURE: The patient was advised of the possible risks andcomplications and agreed to undergo the procedure. The patient was then brought to the angiographic suite for the procedure.  The left neck was prepped with chlorhexidine, drapedin the usual sterile fashion using maximum barrier technique (cap and mask, sterile gown, sterile gloves, large sterile sheet, hand hygiene and cutaneous antisepsis) and infiltrated locally with 1% Lidocaine.  Ultrasound demonstrated patency of the left jugular vein, and this was documented with an image. Under real-time ultrasound guidance, this vein was  accessed with a 21 gauge micropuncture needle and image documentation was performed. A long tract was utilized to create a tunnel. A 0.018 wire was introduced in to the vein. Over this, a 5 [place] single lumen Power tunneled PICC was advanced to the lower SVC/right atrial junction. The cuff was positioned in the subcutaneous tract. Fluoroscopy during the procedure and fluoro spot radiograph confirms appropriate catheter position. The catheter was flushed and covered with asterile dressing.  Catheter length: 24 cm  COMPLICATIONS: None  IMPRESSION: Successful left jugular tunneled power PICC line placement with ultrasound and fluoroscopic guidance. The catheter is ready for use.   Electronically Signed   By: Jolaine Click M.D.   On: 05/04/2015 08:07    CBC  Recent Labs Lab 05/03/15 0342 05/04/15 1018 05/05/15 0534 05/06/15 0435 05/07/15 0443  WBC 16.3* 12.1* 12.6* 11.2* 12.3*  HGB 9.4* 8.4* 8.0* 7.8* 7.7*  HCT 28.6* 26.4* 25.4* 25.0* 24.7*  PLT 440* 358 422* 456* 501*  MCV 75.1* 75.6* 75.8* 76.9* 77.2*  MCH 24.7* 24.1* 23.9* 24.0* 24.1*  MCHC 32.9 31.8 31.5 31.2 31.2  RDW 15.1 15.1 15.3 15.5 15.5    Chemistries   Recent Labs Lab 05/03/15 0342 05/04/15 0728 05/05/15 0534 05/06/15 0435 05/07/15 0443  NA 127* 127* 129* 133* 132*  K 4.1 4.1 4.3 4.0 4.4  CL 92* 86* 87* 91* 91*  CO2 19* 26 28 30 29   GLUCOSE 138* 148* 115* 158* 182*  BUN 53* 62* 63* 64* 59*  CREATININE 5.17* 5.89* 5.81* 5.58* 5.09*  CALCIUM 7.7* 7.2* 7.2* 7.2* 7.3*   ------------------------------------------------------------------------------------------------------------------ estimated creatinine clearance is 25.4 mL/min (by C-G formula based on Cr of 5.09). ------------------------------------------------------------------------------------------------------------------ No results for input(s): HGBA1C in the last 72  hours. ------------------------------------------------------------------------------------------------------------------ No results for input(s): CHOL, HDL, LDLCALC, TRIG, CHOLHDL, LDLDIRECT in the last 72 hours. ------------------------------------------------------------------------------------------------------------------ No results for input(s): TSH, T4TOTAL, T3FREE, THYROIDAB in the last 72 hours.  Invalid input(s): FREET3 ------------------------------------------------------------------------------------------------------------------ No results for input(s): VITAMINB12, FOLATE, FERRITIN, TIBC, IRON, RETICCTPCT in the last 72 hours.  Coagulation profile No results for input(s): INR, PROTIME in the last 168 hours.  No results for input(s): DDIMER in the last 72 hours.  Cardiac Enzymes No results for input(s): CKMB, TROPONINI, MYOGLOBIN in the last 168 hours.  Invalid input(s): CK ------------------------------------------------------------------------------------------------------------------ Invalid input(s): POCBNP   Recent Labs  05/05/15 2201 05/06/15 0644 05/06/15 1601 05/06/15 2128 05/07/15  2202 05/07/15 1117  GLUCAP 143* 164* 96 130* 169* 168*     Glena Pharris A M.D. [facility] 05/07/2015, 1:43 PM  Pager: [phone]  Between 7am to 7pm - call Pager - [phone]  After 7pm go to [email or web address] - password [email or web address]  Call night coverage person covering after 7pm

## 2015-05-07 NOTE — Progress Notes (Signed)
7 Days Post-Op  Subjective: A bit tearful this AM, numbness R hand resolved. C/O sone toe pain B.  Objective: Vital signs in last 24 hours: Temp:  [98.2 F (36.8 C)-99.2 F (37.3 C)] 99.2 F (37.3 C) (05/17 0549) Pulse Rate:  [92-108] 99 (05/17 0549) Resp:  [17-18] 18 (05/17 0549) BP: (110-137)/(59-99) 110/59 mmHg (05/17 0549) SpO2:  [98 %-100 %] 100 % (05/17 0549) Last BM Date: 05/05/15  Intake/Output from previous day: 05/16 0701 - 05/17 0700 In: 890 [P.O.:340; IV Piggyback:550] Out: 3000 [Urine:3000] Intake/Output this shift:    R chest wall wound packed, still some drainage. Toes tender without visable abnormality  Lab Results:   Recent Labs  05/06/15 0435 05/07/15 0443  WBC 11.2* 12.3*  HGB 7.8* 7.7*  HCT 25.0* 24.7*  PLT 456* 501*   BMET  Recent Labs  05/06/15 0435 05/07/15 0443  NA 133* 132*  K 4.0 4.4  CL 91* 91*  CO2 30 29  GLUCOSE 158* 182*  BUN 64* 59*  CREATININE 5.58* 5.09*  CALCIUM 7.2* 7.3*   PT/INR No results for input(s): LABPROT, INR in the last 72 hours. ABG No results for input(s): PHART, HCO3 in the last 72 hours.  Invalid input(s): PCO2, PO2  Studies/Results: No results found.  Anti-infectives: Anti-infectives    Start     Dose/Rate Route Frequency Ordered Stop   05/06/15 0900  fluconazole (DIFLUCAN) tablet 150 mg     150 mg Oral  Once 05/06/15 0840 05/06/15 1234   05/03/15 2200  piperacillin-tazobactam (ZOSYN) IVPB 2.25 g     2.25 g 100 mL/hr over 30 Minutes Intravenous 3 times per day 05/03/15 1514     05/03/15 1000  linezolid (ZYVOX) IVPB 600 mg  Status:  Discontinued     600 mg 300 mL/hr over 60 Minutes Intravenous Every 12 hours 05/03/15 0752 05/06/15 1609   05/03/15 0800  DAPTOmycin (CUBICIN) 1,000 mg in sodium chloride 0.9 % IVPB  Status:  Discontinued     1,000 mg 240 mL/hr over 30 Minutes Intravenous Every 48 hours 05/02/15 1515 05/03/15 0751   05/01/15 0200  cefTRIAXone (ROCEPHIN) 2 g in dextrose 5 % 50 mL IVPB  - Premix  Status:  Discontinued     2 g 100 mL/hr over 30 Minutes Intravenous Every 24 hours 04/30/15 0427 04/30/15 0808   04/30/15 1900  ceFAZolin (ANCEF) 3 g in dextrose 5 % 50 mL IVPB     3 g 160 mL/hr over 30 Minutes Intravenous  Once 04/30/15 1854 04/30/15 1946   04/30/15 1200  vancomycin (VANCOCIN) 1,500 mg in sodium chloride 0.9 % 500 mL IVPB  Status:  Discontinued     1,500 mg 250 mL/hr over 120 Minutes Intravenous Every 8 hours 04/30/15 0436 05/01/15 1350   04/30/15 0830  piperacillin-tazobactam (ZOSYN) IVPB 3.375 g  Status:  Discontinued     3.375 g 12.5 mL/hr over 240 Minutes Intravenous 3 times per day 04/30/15 0824 05/03/15 1514   04/30/15 0445  vancomycin (VANCOCIN) 1,500 mg in sodium chloride 0.9 % 500 mL IVPB     1,500 mg 250 mL/hr over 120 Minutes Intravenous  Once 04/30/15 0436 04/30/15 0707   04/30/15 0315  cefTRIAXone (ROCEPHIN) 2 g in dextrose 5 % 50 mL IVPB     2 g 100 mL/hr over 30 Minutes Intravenous  Once 04/30/15 0312 04/30/15 0433   04/30/15 0115  vancomycin (VANCOCIN) IVPB 1000 mg/200 mL premix     1,000 mg 200 mL/hr over 60 Minutes  Intravenous  Once 04/30/15 0111 04/30/15 0302      Assessment/Plan: Right chest wall subcutaneous abscess -Dressing changes BID WD with 1/2 inch iodoform gauze packing strips -Antibiotics adjusted per ID - appreciate their F/U Right shoulder abscess - per Dr. Eulah Pont  AKI - per primary service  LOS: 7 days    Katrine Radich E 05/07/2015

## 2015-05-07 NOTE — Clinical Documentation Improvement (Signed)
Supporting Information: Anemia, check iron per 5/17 progress notes.  Labs:   H/H: 5/10:  10.7/32.5 5/17:   7.7/24.7  Treatment: 5/16:  Ferrous sulfate 325 mg po 2x/day with meals.   Possible Clinical Condition: . Documentation of Anemia should include the type of anemia: --Nutritional --Hemolytic --Acute blood loss anemia --Acute on chronic blood loss anemia --Other (please specify) . Include in documentation if Anemia is due to nutrition or mineral deficits, resulting in a nutritional anemia . Document if the Anemia is due to a neoplasm (primary and/or secondary) . Document whether the ANEMIA is "related to or due to" chemo or radiotherapy treatments . Document any "cause-and-effect" relationship between the intervention and the blood or immune disorder . Document the specific drug if anemia is drug-induced . Link any laboratory findings to a related diagnosis (if appropriate) . Document any associated diagnoses/conditions    Thank Gabriel Cirri Documentation Specialist [phone] Jannat [email or web address]

## 2015-05-07 NOTE — Progress Notes (Signed)
Subjective: Interval History: has complaints swelling of legs and feet.  Objective: Vital signs in last 24 hours: Temp:  [98.2 F (36.8 C)-99.2 F (37.3 C)] 99.2 F (37.3 C) (05/17 0549) Pulse Rate:  [92-108] 99 (05/17 0549) Resp:  [17-18] 18 (05/17 0549) BP: (110-137)/(59-99) 110/59 mmHg (05/17 0549) SpO2:  [98 %-100 %] 100 % (05/17 0549) Weight change:   Intake/Output from previous day: 05/16 0701 - 05/17 0700 In: 890 [P.O.:340; IV Piggyback:550] Out: 3000 [Urine:3000] Intake/Output this shift:    General appearance: cooperative, moderate distress and morbidly obese Resp: diminished breath sounds bilaterally Cardio: S1, S2 normal and systolic murmur: holosystolic 2/6, blowing at apex GI: obese, pos bs,  Extremities: edema 3+  Lab Results:  Recent Labs  05/06/15 0435 05/07/15 0443  WBC 11.2* 12.3*  HGB 7.8* 7.7*  HCT 25.0* 24.7*  PLT 456* 501*   BMET:  Recent Labs  05/06/15 0435 05/07/15 0443  NA 133* 132*  K 4.0 4.4  CL 91* 91*  CO2 30 29  GLUCOSE 158* 182*  BUN 64* 59*  CREATININE 5.58* 5.09*  CALCIUM 7.2* 7.3*   No results for input(s): PTH in the last 72 hours. Iron Studies: No results for input(s): IRON, TIBC, TRANSFERRIN, FERRITIN in the last 72 hours.  Studies/Results: No results found.  I have reviewed the patient's current medications.  Assessment/Plan: 1  AKI improving, diuresing.  Vol xs yet and xs solute 2 shoulder infx on AB 3 anemia check Fe 4 morbid obesity P follow Cr, limit nephrotoxins, no dye    LOS: 7 days   Scarlettrose Costilow L 05/07/2015,11:27 AM

## 2015-05-07 NOTE — Progress Notes (Signed)
Occupational Therapy Treatment Patient Details Name: Lindsay [name] MRN: [id number] DOB: Jul 24, 1981 Today's Date: 05/07/2015    History of present illness Pt is s/p I&D of the right shoulder with arthroscopy and I&D of the right chest wall abscess as well, on 04-30-2015. PMH includes: obesity, asthma, diabetes, hypertension, acute renal failure, septic arthritis.    OT comments  Bed level today due to pt scheduled for doppler due to LE pain. Focus on RUE rehab. Increased ROM today. Pt to wear sling for comfort only when OOB. Pt to keep RUE elevated and use ice for edema control Pt given exercises and dowel to use to complete exercises in bed. Will plan to see tomorrow. Good participation.  Follow Up Recommendations  Supervision/Assistance - 24 hour;Home health OT    Equipment Recommendations  Tub/shower seat    Recommendations for Other Services      Precautions / Restrictions Precautions Precautions: Fall Precaution Comments: skin breakdown under breadts;groin;under pannus Required Braces or Orthoses: Sling Restrictions RUE Weight Bearing: Weight bearing as tolerated              ADL                                         General ADL Comments: Bed level exercises due to pt scheduled for doppler due to BLE pain                                      Cognition   Behavior During Therapy: [facility] for tasks assessed/performed Overall Cognitive Status: Within Functional Limits for tasks assessed                       Extremity/Trunk Assessment               Exercises Shoulder Exercises Shoulder Flexion: AAROM;Right;20 reps;Supine (45-5- FF) Shoulder ABduction: AAROM;Supine;20 reps (45) Shoulder External Rotation: AAROM;15 reps (20 degrees) Elbow Flexion: AAROM;Right;15 reps Elbow Extension: AAROM;Right;20 reps Wrist Flexion: AROM;Right;20 reps Wrist Extension: AROM;Right;20 reps Digit Composite Flexion: AROM;Right;20 reps  (squeeze ball) Composite Extension: AROM;Right;15 reps Other Exercises Other Exercises: squeeze ball Other Exercises: dowel exercises in supine ER/Abd   Shoulder Instructions       General Comments      Pertinent Vitals/ Pain       Pain Assessment: 0-10 Pain Score: 5  Pain Location: R shoulder Pain Descriptors / Indicators: Aching Pain Intervention(s): Limited activity within patient's tolerance;Monitored during session;Repositioned  Home Living                                          Prior Functioning/Environment              Frequency Min 3X/week     Progress Toward Goals  OT Goals(current goals can now be found in the care plan section)  Progress towards OT goals: Progressing toward goals  Acute Rehab OT Goals Patient Stated Goal: to go home OT Goal Formulation: With patient Time For Goal Achievement: 05/20/15 Potential to Achieve Goals: Good ADL Goals Pt Will Perform Upper Body Bathing: with modified independence;with adaptive equipment;sitting Pt Will Perform Lower Body Bathing: with supervision;with adaptive equipment;sit to/from stand Pt Will Perform Upper  Body Dressing: with adaptive equipment;sitting;with min guard assist Pt Will Perform Lower Body Dressing: with min assist;with adaptive equipment;sit to/from stand Pt Will Transfer to Toilet: with min guard assist;ambulating;regular height toilet;grab bars Pt Will Perform Tub/Shower Transfer: with min guard assist;ambulating;shower seat  Plan Discharge plan remains appropriate;Frequency needs to be updated    Co-evaluation                 End of Session     Activity Tolerance Patient tolerated treatment well   Patient Left in bed;with call bell/phone within reach;with nursing/sitter in room   Nurse Communication Mobility status;Precautions;Weight bearing status        Time: 1510-1545 OT Time Calculation (min): 35 min  Charges: OT General Charges $OT Visit: 1  Procedure OT Treatments $Therapeutic Activity: 8-22 mins $Therapeutic Exercise: 8-22 mins  [place] Searles,HILLARY 05/07/2015, 3:55 PM   [facility], OTR/L  [phone] 05/07/2015.hil

## 2015-05-07 NOTE — Progress Notes (Signed)
VASCULAR LAB PRELIMINARY  PRELIMINARY  PRELIMINARY  PRELIMINARY  Bilateral lower extremity venous duplex completed.    Preliminary report:  Technically limited due to body habitus and swelling. No obvious evidence of deep vein or superficial vein thrombosis in the lower extremity veins visualized bilaterally. No evidence of a Baker's cyst in the right or left lower extremity.  Demetrios Byron, RVS 05/07/2015, 4:51 PM

## 2015-05-08 LAB — CBC
HCT: 25.2 % — ABNORMAL LOW (ref 36.0–46.0)
HEMOGLOBIN: 7.6 g/dL — AB (ref 12.0–15.0)
MCH: 23.8 pg — AB (ref 26.0–34.0)
MCHC: 30.2 g/dL (ref 30.0–36.0)
MCV: 79 fL (ref 78.0–100.0)
PLATELETS: 536 10*3/uL — AB (ref 150–400)
RBC: 3.19 MIL/uL — AB (ref 3.87–5.11)
RDW: 15.7 % — ABNORMAL HIGH (ref 11.5–15.5)
WBC: 11 10*3/uL — ABNORMAL HIGH (ref 4.0–10.5)

## 2015-05-08 LAB — RENAL FUNCTION PANEL
ANION GAP: 9 (ref 5–15)
Albumin: 1.5 g/dL — ABNORMAL LOW (ref 3.5–5.0)
BUN: 52 mg/dL — ABNORMAL HIGH (ref 6–20)
CO2: 31 mmol/L (ref 22–32)
Calcium: 7.6 mg/dL — ABNORMAL LOW (ref 8.9–10.3)
Chloride: 96 mmol/L — ABNORMAL LOW (ref 101–111)
Creatinine, Ser: 4.26 mg/dL — ABNORMAL HIGH (ref 0.44–1.00)
GFR, EST AFRICAN AMERICAN: 15 mL/min — AB (ref >60)
GFR, EST NON AFRICAN AMERICAN: 13 mL/min — AB (ref >60)
Glucose, Bld: 270 mg/dL — ABNORMAL HIGH (ref 65–99)
PHOSPHORUS: 4.8 mg/dL — AB (ref 2.5–4.6)
POTASSIUM: 4.7 mmol/L (ref 3.5–5.1)
SODIUM: 136 mmol/L (ref 135–145)

## 2015-05-08 LAB — GLUCOSE, CAPILLARY
GLUCOSE-CAPILLARY: 178 mg/dL — AB (ref 65–99)
GLUCOSE-CAPILLARY: 175 mg/dL — AB (ref 65–99)
GLUCOSE-CAPILLARY: 136 mg/dL — AB (ref 65–99)
Glucose-Capillary: 239 mg/dL — ABNORMAL HIGH (ref 65–99)
Glucose-Capillary: 232 mg/dL — ABNORMAL HIGH (ref 65–99)

## 2015-05-08 MED ORDER — INSULIN GLARGINE 100 UNIT/ML ~~LOC~~ SOLN
70.0000 [IU] | Freq: Every day | SUBCUTANEOUS | Status: DC
  Administered 2015-05-09 – 2015-05-14 (×6): 70 [IU] via SUBCUTANEOUS
  Filled 2015-05-08 (×6): qty 0.7

## 2015-05-08 MED ORDER — SODIUM CHLORIDE 0.9 % IV SOLN
510.0000 mg | Freq: Once | INTRAVENOUS | Status: AC
  Administered 2015-05-08: 510 mg via INTRAVENOUS
  Filled 2015-05-08 (×2): qty 17

## 2015-05-08 NOTE — Clinical Social Work Placement (Signed)
   CLINICAL SOCIAL WORK PLACEMENT  NOTE  Date:  05/08/2015  Patient Details  Name: Lindsay [name] MRN: [id number] Date of Birth: Mar 22, 1981  Clinical Social Work is seeking post-discharge placement for this patient at the Skilled  Nursing Facility level of care (*CSW will initial, date and re-position this form in  chart as items are completed):  Yes   Patient/family provided with [place] Clinical Social Work Department's list of facilities offering this level of care within the geographic area requested by the patient (or if unable, by the patient's family).  Yes   Patient/family informed of their freedom to choose among providers that offer the needed level of care, that participate in Medicare, Medicaid or managed care program needed by the patient, have an available bed and are willing to accept the patient.  Yes   Patient/family informed of 's ownership interest in [facility] and [facility], as well as of the fact that they are under no obligation to receive care at these facilities.  PASRR submitted to EDS on 05/08/15     PASRR number received on 05/08/15     Existing PASRR number confirmed on       FL2 transmitted to all facilities in geographic area requested by pt/family on 05/08/15     FL2 transmitted to all facilities within larger geographic area on 05/08/15     Patient informed that his/her managed care company has contracts with or will negotiate with certain facilities, including the following:   (n/a)         Patient/family informed of bed offers received.  Patient chooses bed at       Physician recommends and patient chooses bed at      Patient to be transferred to   on  .  Patient to be transferred to facility by       Patient family notified on   of transfer.  Name of family member notified:        PHYSICIAN Please prepare prescriptions, Please sign FL2     Additional Comment:     _______________________________________________ Rondel Baton, LCSW 05/08/2015, 12:25 PM

## 2015-05-08 NOTE — Progress Notes (Signed)
Occupational Therapy Treatment Patient Details Name: Lindsay [name] MRN: [id number] DOB: 06-20-1981 Today's Date: 05/08/2015    History of present illness Pt is s/p I&D of the right shoulder with arthroscopy and I&D of the right chest wall abscess as well, on 04-30-2015. PMH includes: obesity, asthma, diabetes, hypertension, acute renal failure, septic arthritis.    OT comments  Pt making steady progress. Discussed importance of completing exercises throughout the day to increase RUE strength. Also discussed importance of participating in ADL with CNA using AE and techniques taught by OT. Pt verbalized understanding. Discussed discharge plans with SW. Pt needs to continue to receive OT after D/C- Pt will be able to mobilize at a safe level, but will need assistance for dressing changes and with ADL. Pt will benefit from a high frequency acutely due to limited OT resources after D/C.   Follow Up Recommendations  Supervision/Assistance - 24 hour;Home health OT    Equipment Recommendations  Tub/shower seat    Recommendations for Other Services  Pastoral Consult    Precautions / Restrictions Precautions Precautions: Fall Precaution Comments: skin breakdown under breadts;groin;under pannus Required Braces or Orthoses: Sling (for comfort when ambulating) Restrictions RUE Weight Bearing: Weight bearing as tolerated Other Position/Activity Restrictions: ROM as tolerated       Mobility Bed Mobility Overal bed mobility: Needs Assistance Bed Mobility: Sit to Supine     Supine to sit: Min assist     General bed mobility comments: up on EOB with PT when OT entered room  Transfers Overall transfer level: Needs assistance Equipment used: None Transfers: Sit to/from Stand Sit to Stand: Mod assist (from lower surface)  Min A from higher level surfaces;uses momentum         General transfer comment: educated on positioning toward edge of chair and using momentum. Cues for hand  placement    Balance    minguard at times                               ADL                                         General ADL Comments: Discussed need for pt to participate with her ADL with AE that was given to her  Pt completes ADL easier at sink level in standing. At this time needs second person to assist with thouroughly cleaning due to inability to use RUE as an assist at this time. Pt states she feels she can get family/friends to help with this.                                      Cognition   Behavior During Therapy: WFL for tasks assessed/performed Overall Cognitive Status: Within Functional Limits for tasks assessed                       Extremity/Trunk Assessment   RUE strength slowly improving. Tolerating @60  FF; 30 ER; 45 Abd            Exercises Shoulder Exercises Pendulum Exercise: AAROM;Right;15 reps;Standing Shoulder Flexion: AAROM;Right;10 reps;Standing Shoulder Extension: AAROM;Right;15 reps;Standing Shoulder ABduction: AAROM;10 reps;Standing Shoulder External Rotation: AAROM;Right;15 reps;Standing Elbow Flexion: AROM;AAROM;Right;20 reps;Standing Elbow Extension: AROM;AAROM;20 reps;Standing Wrist Flexion: AROM;Right;10  reps Wrist Extension: AROM;Right;10 reps Other Exercises Other Exercises: squeeze ball Other Exercises: table slides in FF, ER, Abd in standing position at counter Sling wearing schedule (on at all times/off for ADL's): Patient able to independently direct caregiver Proper positioning of operated UE when showering: Patient able to independently direct caregiver      Shoulder Instructions Emphasized importance of pt completing AAROM using table/countertop given difficulty lifting weight of RUE.    General Comments      Pertinent Vitals/ Pain       Pain Assessment: Faces Pain Score: 9  Faces Pain Scale: Hurts even more Pain Location: R shoulder during ROM Pain Descriptors  / Indicators: Sore;Burning;Aching Pain Intervention(s): Monitored during session;Limited activity within patient's tolerance (declined pain meds prior to session)  Home Living                                          Prior Functioning/Environment              Frequency Min 4X/week     Progress Toward Goals  OT Goals(current goals can now be found in the care plan section)  Progress towards OT goals: Progressing toward goals  Acute Rehab OT Goals Patient Stated Goal: to go home OT Goal Formulation: With patient Time For Goal Achievement: 05/20/15 Potential to Achieve Goals: Good ADL Goals Pt Will Perform Upper Body Bathing: with modified independence;with adaptive equipment;sitting Pt Will Perform Lower Body Bathing: with supervision;with adaptive equipment;sit to/from stand Pt Will Perform Upper Body Dressing: with adaptive equipment;sitting;with min guard assist Pt Will Perform Lower Body Dressing: with min assist;with adaptive equipment;sit to/from stand Pt Will Transfer to Toilet: with min guard assist;ambulating;regular height toilet;grab bars Pt Will Perform Tub/Shower Transfer: with min guard assist;ambulating;shower seat Pt/caregiver will Perform Home Exercise Program: Right Upper extremity;With written HEP provided  Plan Discharge plan remains appropriate;Frequency needs to be updated    Co-evaluation    PT/OT/SLP Co-Evaluation/Treatment: Yes Reason for Co-Treatment:  (overlapped with OT)   OT goals addressed during session: ADL's and self-care;Strengthening/ROM;Other (comment) (mobility; partial session with PT)      End of Session Equipment Utilized During Treatment: Gait belt;Other (comment) (IV pole)   Activity Tolerance Patient tolerated treatment well   Patient Left in chair;with call bell/phone within reach   Nurse Communication Mobility status        Time: 1055-1140 OT Time Calculation (min): 45 min  Charges: OT General  Charges $OT Visit: 1 Procedure OT Treatments $Therapeutic Activity: 8-22 mins $Therapeutic Exercise: 8-22 mins  Robina Hamor,HILLARY 05/08/2015, 2:29 PM   [facility], OTR/L  [phone] 05/08/2015

## 2015-05-08 NOTE — Progress Notes (Signed)
Physical Therapy Treatment Patient Details Name: Lindsay [name] MRN: [id number] DOB: 09/12/1981 Today's Date: 05/08/2015    History of Present Illness Pt is s/p I&D of the right shoulder with arthroscopy and I&D of the right chest wall abscess as well, on 04-30-2015. PMH includes: obesity, asthma, diabetes, hypertension, acute renal failure, septic arthritis.     PT Comments    Patient required MAX encouragement and coaxing to participate with therapies this AM. Once up patient did very well and was able to walk down hallway and back with Min A. Continue to push patient and have nursing walk with patient as able. Patient will need to push herself and find motivation for herself if she plans to return to baseline and become functionally independent.   Follow Up Recommendations  Home health PT;Supervision/Assistance - 24 hour     Equipment Recommendations  Cane ( possibly cane. will assess next visit)    Recommendations for Other Services       Precautions / Restrictions Precautions Precautions: Fall Precaution Comments: skin breakdown under breadts;groin;under pannus Required Braces or Orthoses: Sling Restrictions RUE Weight Bearing: Weight bearing as tolerated Other Position/Activity Restrictions: ROM as tolerated    Mobility  Bed Mobility Overal bed mobility: Needs Assistance Bed Mobility: Sit to Supine     Supine to sit: Min assist     General bed mobility comments: Min A to support R UE and scapular area into sitting EOB. Increased time  Transfers Overall transfer level: Needs assistance Equipment used: None Transfers: Sit to/from Stand Sit to Stand: Mod assist         General transfer comment: educated on positioning toward edge of chair and using momentum. Cues for hand placement  Ambulation/Gait Ambulation/Gait assistance: Min guard;Min assist Ambulation Distance (Feet): 200 Feet Assistive device: None Gait Pattern/deviations: Wide base of  support;Step-through pattern Gait velocity: decreased Gait velocity interpretation: Below normal speed for age/gender General Gait Details: Pt using IV pole for support initially however had patient walk with none and she did so with slight sway (due to wide BOS/body habitus). No LOB. One seated rest break. Max Ambulance person [place] (Stroke Patients Only)       Balance                                    Cognition Arousal/Alertness: Awake/alert Behavior During Therapy: WFL for tasks assessed/performed Overall Cognitive Status: Within Functional Limits for tasks assessed                      Exercises      General Comments        Pertinent Vitals/Pain Pain Score: 9  Pain Location: R shoulder and B feet on 'fire' Pain Descriptors / Indicators: Sore;Burning;Aching Pain Intervention(s): Monitored during session;Limited activity within patient's tolerance (declined pain meds prior to session)    Home Living                      Prior Function            PT Goals (current goals can now be found in the care plan section) Progress towards PT goals: Progressing toward goals    Frequency  Min 3X/week    PT Plan Current plan remains appropriate    Co-evaluation  Reason for Co-Treatment:  (overlapped with OT)         End of Session Equipment Utilized During Treatment: Gait belt Activity Tolerance: Patient tolerated treatment well Patient left:  (standing with OT at sink)     Time: 1856-3149 PT Time Calculation (min) (ACUTE ONLY): 43 min  Charges:  $Gait Training: 8-22 mins $Therapeutic Activity: 8-22 mins                    G Codes:      Fredrich Birks 05/08/2015, 12:06 PM 05/08/2015 Fredrich Birks PTA [phone] pager [phone] office

## 2015-05-08 NOTE — Clinical Social Work Note (Signed)
CSW met with patient at bedside.  Patient continues to want to go home at time of discharge.  SNF has been worked up if patient does not have proper care at home once medically stable.  Barriers to placement: Medicaid does not have SNF benefit; patient wishes to be able to care for her daughter at time of discharge.  First choice: home with caregivers (patient states she is working to arrange). Second option, SNF placement with Letter of Guarantee (LOG) possible out of county.  RNCM aware and is involved with disposition planning.   Nonnie Done, LCSW [phone]  Psychiatric & Orthopedics (5N 1-8) Clinical Social Worker

## 2015-05-08 NOTE — Clinical Social Work Note (Signed)
Clinical Social Work Assessment  Patient Details  Name: Lindsay [name] MRN: [id number] Date of Birth: [birth year]  Date of referral:  05/08/15               Reason for consult:  Facility Placement                Permission sought to share information with:  Chartered certified accountant granted to share information::  Yes, Verbal Permission Granted  Name::        Agency::   ([place])  Relationship::     Contact Information:     Housing/Transportation Living arrangements for the past 2 months:  Single Family Home Source of Information:  Patient Patient Interpreter Needed:  None Criminal Activity/Legal Involvement Pertinent to Current Situation/Hospitalization:  No - Comment as needed Significant Relationships:  Other Family Members (grandmother) Lives with:  Minor Children (72 year old currently staying with a friend) Do you feel safe going back to the place where you live?  Yes Need for family participation in patient care:  Yes (Comment) (possible wound care and IV antibiotics needed when discharged home.  Patient cannot change dressings on her own and will need assistnace and education will need to be provided to caregiver)  Care giving concerns:  No family was at bedside during the course of this assessment.   Social Worker assessment / plan:  CSW met with patient at bedside to review disposition: home with 24-hour supervision.  Patient has dressing changes on right shoulder that will need to be changed approximately 2 times per day and will likely dc home with IV abx.  Patient states that she lives at home alone with her 33 year old daughter.  Patient reports having folks from the church and friends/family that would be able to assist her with dressing changes and IV abx.  RNCM aware that patient's first choice is to return home with daughter and have family/friends assist her.  This plan will need to be solid and caregivers would need to receive education surrounding  wound care and IV abx administration.  Patient is aware.  RNCM will follow-up. Patient was agreeable to SNF search.  Patient was educated regarding Letter of Guarantee (LOG) process and the limited facilities and the possibility that this may not be local.  Patient became tearful and states she wishes to go home with daughter and not a facility.    Employment status:  Unemployed Forensic scientist:  Medicaid In [place] PT Recommendations:  [address] [place] / Referral to community resources:  [place]  Patient/Family's Response to care:  Patient was appreciative of CSW assistance, but wishes to go home at time of discharge.  Patient/Family's Understanding of and Emotional Response to Diagnosis, Current Treatment, and Prognosis:  Patient does not seem to understand the extent of her needs at time of discharge though education has been provided to her by this CSW and by [facility].  Patient is having a difficult time with her prognosis and disposition.  Emotional Assessment Appearance:  Appears older than stated age Attitude/Demeanor/Rapport:   (appropriate) Affect (typically observed):  Adaptable, Tearful/Crying Orientation:  Oriented to Self, Oriented to Place, Oriented to  Time, Oriented to Situation Alcohol / Substance use:  Not Applicable Psych involvement (Current and /or in the community):  No (Comment)  Discharge Needs  Concerns to be addressed:  Adjustment to Illness, Medication Concerns, Other (Comment Required (wound care) Readmission within the last 30 days:  No Current discharge risk:  Lack of support system, Lives alone Barriers to Discharge:  Inadequate or no insurance, Family Issues   Dulcy Fanny, LCSW 05/08/2015, 12:20 PM

## 2015-05-08 NOTE — Progress Notes (Signed)
INFECTIOUS DISEASE PROGRESS NOTE  ID: Lindsay [name] is a 34 y.o. 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 Still some intermittent numbness of R hand.   Abtx:  Anti-infectives    Start     Dose/Rate Route Frequency Ordered Stop   05/07/15 2200  ceftaroline (TEFLARO) 300 mg in sodium chloride 0.9 % 250 mL IVPB     300 mg 250 mL/hr over 60 Minutes Intravenous Every 12 hours 05/07/15 1552     05/06/15 0900  fluconazole (DIFLUCAN) tablet 150 mg     150 mg Oral  Once 05/06/15 0840 05/06/15 1234   05/03/15 2200  piperacillin-tazobactam (ZOSYN) IVPB 2.25 g  Status:  Discontinued     2.25 g 100 mL/hr over 30 Minutes Intravenous 3 times per day 05/03/15 1514 05/07/15 1552   05/03/15 1000  linezolid (ZYVOX) IVPB 600 mg  Status:  Discontinued     600 mg 300 mL/hr over 60 Minutes Intravenous Every 12 hours 05/03/15 0752 05/06/15 1609   05/03/15 0800  DAPTOmycin (CUBICIN) 1,000 mg in sodium chloride 0.9 % IVPB  Status:  Discontinued     1,000 mg 240 mL/hr over 30 Minutes Intravenous Every 48 hours 05/02/15 1515 05/03/15 0751   05/01/15 0200  cefTRIAXone (ROCEPHIN) 2 g in dextrose 5 % 50 mL IVPB - Premix  Status:  Discontinued     2 g 100 mL/hr over 30 Minutes Intravenous Every 24 hours 04/30/15 0427 04/30/15 0808   04/30/15 1900  ceFAZolin (ANCEF) 3 g in dextrose 5 % 50 mL IVPB     3 g 160 mL/hr over 30 Minutes Intravenous  Once 04/30/15 1854 04/30/15 1946   04/30/15 1200  vancomycin (VANCOCIN) 1,500 mg in sodium chloride 0.9 % 500 mL IVPB  Status:  Discontinued     1,500 mg 250 mL/hr over 120 Minutes Intravenous Every 8 hours 04/30/15 0436 05/01/15 1350   04/30/15 0830   piperacillin-tazobactam (ZOSYN) IVPB 3.375 g  Status:  Discontinued     3.375 g 12.5 mL/hr over 240 Minutes Intravenous 3 times per day 04/30/15 0824 05/03/15 1514   04/30/15 0445  vancomycin (VANCOCIN) 1,500 mg in sodium chloride 0.9 % 500 mL IVPB     1,500 mg 250 mL/hr over 120 Minutes Intravenous  Once 04/30/15 0436 04/30/15 0707   04/30/15 0315  cefTRIAXone (ROCEPHIN) 2 g in dextrose 5 % 50 mL IVPB     2 g 100 mL/hr over 30 Minutes Intravenous  Once 04/30/15 0312 04/30/15 0433   04/30/15 0115  vancomycin (VANCOCIN) IVPB 1000 mg/200 mL premix     1,000 mg 200 mL/hr over 60 Minutes Intravenous  Once 04/30/15 0111 04/30/15 0302      Medications:  Scheduled: . bisacodyl  10 mg Rectal Once  . ceFTAROline (TEFLARO) IV  300 mg Intravenous Q12H  . docusate sodium  100 mg Oral BID  . ferrous sulfate  325 mg Oral BID WC  . gabapentin  100 mg Oral TID  . heparin subcutaneous  5,000 Units Subcutaneous 3 times per day  . insulin aspart  0-20 Units Subcutaneous TID WC  . insulin aspart  0-5 Units Subcutaneous QHS  .  insulin aspart  10 Units Subcutaneous TID WC  . [START ON 05/09/2015] insulin glargine  70 Units Subcutaneous Daily  . nystatin   Topical TID  . polyethylene glycol  17 g Oral Daily  . Vitamin D (Ergocalciferol)  50,000 Units Oral Q7 days    Objective: Vital signs in last 24 hours: Temp:  [97.6 F (36.4 C)-99.1 F (37.3 C)] 99.1 F (37.3 C) (05/18 1321) Pulse Rate:  [99-108] 108 (05/18 1321) Resp:  [17-18] 18 (05/18 1321) BP: (127-144)/(52-70) 128/67 mmHg (05/18 1321) SpO2:  [100 %] 100 % (05/18 1321)   General appearance: alert, cooperative and no distress Incision/Wound: R shoulder wound dressed. R hand grip strength is 5/5.   Lab Results  Recent Labs  05/07/15 0443 05/08/15 0511  WBC 12.3* 11.0*  HGB 7.7* 7.6*  HCT 24.7* 25.2*  NA 132* 136  K 4.4 4.7  CL 91* 96*  CO2 29 31  BUN 59* 52*  CREATININE 5.09* 4.26*   Liver Panel  Recent Labs   05/07/15 0443 05/08/15 0511  ALBUMIN 1.4* 1.5*   Sedimentation Rate No results for input(s): ESRSEDRATE in the last 72 hours. C-Reactive Protein No results for input(s): CRP in the last 72 hours.  Microbiology: Recent Results (from the past 240 hour(s))  Surgical pcr screen     Status: Abnormal   Collection Time: 04/30/15  3:23 PM  Result Value Ref Range Status   MRSA, PCR NEGATIVE NEGATIVE Final   Staphylococcus aureus POSITIVE (A) NEGATIVE Final    Comment:        The Xpert SA Assay (FDA approved for NASAL specimens in patients over 56 years of age), is one component of a comprehensive surveillance program.  Test performance has been validated by [facility] for patients greater than or equal to 34 year old. It is not intended to diagnose infection nor to guide or monitor treatment.   Anaerobic culture     Status: None (Preliminary result)   Collection Time: 04/30/15  7:41 PM  Result Value Ref Range Status   Specimen Description WOUND RIGHT SHOULDER  Final   Special Requests RIGHT SHOULDER  Final   Gram Stain PENDING  Incomplete   Culture   Final    No Anaerobes Isolated; Culture in Progress for 14 DAYS Performed at [facility]    Report Status PENDING  Incomplete  Culture, routine-abscess     Status: None   Collection Time: 04/30/15  7:41 PM  Result Value Ref Range Status   Specimen Description ABSCESS RIGHT SHOULDER  Final   Special Requests PATIENT ON FOLLOWING VANCOMYCIN ZOSYN  Final   Gram Stain   Final    NO WBC SEEN NO SQUAMOUS EPITHELIAL CELLS SEEN NO ORGANISMS SEEN Performed at [facility]    Culture   Final    NO GROWTH 3 DAYS Performed at [facility]    Report Status 05/04/2015 FINAL  Final  Anaerobic culture     Status: None   Collection Time: 04/30/15  7:47 PM  Result Value Ref Range Status   Specimen Description ABSCESS  DRAINAGE FROM RIGHT SHOULDER  Final   Special Requests PATIENT ON FOLLOWING VANCOMYCIN ZOSYN B   Final   Gram Stain   Final    NO WBC SEEN NO SQUAMOUS EPITHELIAL CELLS SEEN NO ORGANISMS SEEN Performed at [facility]    Culture   Final    NO ANAEROBES ISOLATED Performed at [facility]    Report Status  05/05/2015 FINAL  Final  Culture, routine-abscess     Status: None   Collection Time: 04/30/15  7:47 PM  Result Value Ref Range Status   Specimen Description ABSCESS RIGHT SHOULDER DRAINAGE  Final   Special Requests   Final    PATIENT ON FOLLOWING VANCOMYCIN ZOSYN SPECIMEN B 2 CUPS   Gram Stain   Final    NO WBC SEEN NO SQUAMOUS EPITHELIAL CELLS SEEN NO ORGANISMS SEEN Performed at [facility]    Culture   Final    NO GROWTH 3 DAYS Performed at [facility]    Report Status 05/04/2015 FINAL  Final  Anaerobic culture     Status: None (Preliminary result)   Collection Time: 04/30/15  7:48 PM  Result Value Ref Range Status   Specimen Description FLUID RIGHT SHOULDER JOINT  Final   Special Requests PATIENT ON FOLLOWING VANCOMYCIN ZOSYN C  Final   Gram Stain   Final    RARE WBC PRESENT, PREDOMINANTLY PMN NO ORGANISMS SEEN Performed at [facility]   Final    No Anaerobes Isolated; Culture in Progress for 14 DAYS Performed at [facility]    Report Status PENDING  Incomplete  Body fluid culture     Status: None   Collection Time: 04/30/15  7:48 PM  Result Value Ref Range Status   Specimen Description FLUID RIGHT SHOULDER JOINT  Final   Special Requests PATIENT ON FOLLOWING VANC,ZOSYN  Final   Gram Stain   Final    RARE WBC PRESENT, PREDOMINANTLY PMN NO ORGANISMS SEEN Performed at [facility]    Culture   Final    NO GROWTH 3 DAYS Performed at [facility]    Report Status 05/04/2015 FINAL  Final    Studies/Results: No results found.   Assessment/Plan: Septic arthritis (R shoulder), chest wall abscess, myositis Debrided 5-10 Cx (-) DM2 (since  2004) Acute Renal Failure [place] Obesity (BMI 53.53) LE edema Protein- albumin malnutrition, severe  Total days of antibiotics: 9 (ceftaroline day 2)  She previously attributed numbness in her arm to zyvox. Her Cr was increased and there was concern about relation of this to vanco.  Her Cr is trending down now.  Will watch on ceftaroline Her Cx from 5-7 is still not resulted. The gram stain of this showed GNR. All other Cx are (-).  Would aim for 28 days of anbx Available as needed.          Johny Sax Infectious Diseases (pager) [phone] [email or web address] 05/08/2015, 3:28 PM  LOS: 8 days

## 2015-05-08 NOTE — Progress Notes (Signed)
Subjective: Interval History: has complaints swelling , wants fluid pill, burning in feet with walking .  Objective: Vital signs in last 24 hours: Temp:  [97.6 F (36.4 C)-98.8 F (37.1 C)] 97.6 F (36.4 C) (05/18 0640) Pulse Rate:  [90-102] 99 (05/18 0640) Resp:  [17-18] 17 (05/18 0640) BP: (127-144)/(52-71) 127/70 mmHg (05/18 0640) SpO2:  [98 %-100 %] 100 % (05/18 0640) Weight change:   Intake/Output from previous day: 05/17 0701 - 05/18 0700 In: 1690 [P.O.:1440; IV Piggyback:250] Out: 4351 [Urine:4350; Stool:1] Intake/Output this shift: Total I/O In: 240 [P.O.:240] Out: -   General appearance: cooperative and morbidly obese Resp: diminished breath sounds bilaterally Cardio: S1, S2 normal and systolic murmur: holosystolic 2/6, blowing at apex GI: massive, pos bs Extremities: edema 3+ and bandage R shoulder  Lab Results:  Recent Labs  05/07/15 0443 05/08/15 0511  WBC 12.3* 11.0*  HGB 7.7* 7.6*  HCT 24.7* 25.2*  PLT 501* 536*   BMET:  Recent Labs  05/07/15 0443 05/08/15 0511  NA 132* 136  K 4.4 4.7  CL 91* 96*  CO2 29 31  GLUCOSE 182* 270*  BUN 59* 52*  CREATININE 5.09* 4.26*  CALCIUM 7.3* 7.6*   No results for input(s): PTH in the last 72 hours. Iron Studies:  Recent Labs  05/07/15 1355  IRON 29  TIBC 199*    Studies/Results: No results found.  I have reviewed the patient's current medications.  Assessment/Plan: 1 AKI improving diuresing, still vol xs, but better. K and acid base ok 2 Anemia Fe lower give 1 dose iv 3 shoulder infx 4 massive obesity P follow K avoid new meds.  AB    LOS: 8 days   Rondle Lohse L 05/08/2015,10:47 AM

## 2015-05-08 NOTE — Progress Notes (Signed)
[facility]                                                                              Patient Demographics  Lindsay [name], is a 34 y.o. female, DOB - 12-19-1981, UJW:[id number]  Admit date - 04/30/2015   Admitting Physician Houston Siren, MD  Outpatient Primary MD for the patient is Doris Cheadle, MD  LOS - 8   Chief Complaint  Patient presents with  . Shoulder Pain       Brief HPI   The patient is a 34 year old female with obesity, asthma, diabetes, hypertension presented to ED with right shoulder worsening pain. Patient was recently seen in the ED and had a small I&D done in ER and was placed on doxycycline. Patient however continued to have increased pain but no subjective fevers or chills. Evaluation in ED included a marked leukocytosis with WBC of 18 K and elevated blood sugar in 400s. Patient was started on IV vancomycin and hospitalist was asked to admit for cellulitis and hyperglycemia.   Assessment & Plan    Cellulitis/Abscess of right shoulder and chest wall abscess/myositis -postop day # 8 - MRI of the right shoulder which showed severe muscle edema with small 5 x 6x 25 mm fluid collection overall appearance concerning for myositis due to infectious or inflammatory etiology, large glenohumeral joint effusion, possible septic arthritis - underwent irrigation and debridement of the right shoulder with arthroscopy and patient was also found to have right chest wall subcutaneous abscess and Dr. Derrell Lolling was consulted intraoperatively. Patient underwent I&D of the right chest wall abscess as well, on 04-30-2015. - Creatinine function noticed to be trending up, vancomycin was discontinued. ID consult was obtained, patient seen by Dr. Ninetta Lights on 5/12, recommended to change to Zyvox and Zosyn. Zyvox stopped 5-16 due to concern for neuropathy.  -day 7 of IV antibiotics, now on Teflaro Day2 -PT and OT consulted. needs HH.  -follow up ID recommendations regarding  length of antibiotics, once creatinine continues to trend down could likely place PICC   Encephalopathy; -resolved -Suspect multifactorial; metabolic acidosis, renal failure, infectious process.   -gabapentin dose decreased to 100 mg TID due to renal failure.   Diabetes mellitus due to underlying condition :  - Blood sugars in 200s now.  - Continue lantus, increase dose to 70 units  Acute kidney injury: - suspect ATN due to sepsis, Vanc toxicity,. Baseline creatinine normal - Creatinine peaked at 5.8, improving now 4.2, brisk urine output -Vancomycin was discontinued and renal consulted, renal ultrasound showed no renal obstruction. -fluids stopped  Right upper extremity swelling - doppler negative for DVT  Obesity - Patient counseled on diet and weight control  HTN (hypertension) -Currently BP borderline stable, not on any antihypertensive  Hyponatremia: resolved, related to renal failure and pseudohyponatremia from hyperglycemia.   Patient also had some bleeding from the site of heparin shots - no significant bleeding. Resume heparin.   Skin, maceration, skin candidiasis;  Nystatin powder ordered.  Wound care nurse.    Code Status: Full code   Family Communication: Discussed in detail with the patient, all imaging results, lab results explained to the  patient.   Disposition Plan: Not medically ready   Time Spent in minutes   25 minutes  Procedures  Irrigation and debridement of the right shoulder   I&D of the chest wall 5-10  Consults   Orthopedics, Dr. Eulah Pont   general surgery, Dr. Derrell Lolling  infectious disease Nephrology  DVT Prophylaxis SCDs . No significant bleeding, restarted heparin.   Medications  Scheduled Meds: . bisacodyl  10 mg Rectal Once  . ceFTAROline (TEFLARO) IV  300 mg Intravenous Q12H  . docusate sodium  100 mg Oral BID  . ferrous sulfate  325 mg Oral BID WC  . ferumoxytol  510 mg Intravenous Once  . gabapentin  100 mg Oral TID  .  heparin subcutaneous  5,000 Units Subcutaneous 3 times per day  . insulin aspart  0-20 Units Subcutaneous TID WC  . insulin aspart  0-5 Units Subcutaneous QHS  . insulin aspart  10 Units Subcutaneous TID WC  . [START ON 05/09/2015] insulin glargine  70 Units Subcutaneous Daily  . nystatin   Topical TID  . polyethylene glycol  17 g Oral Daily  . Vitamin D (Ergocalciferol)  50,000 Units Oral Q7 days   Continuous Infusions:   PRN Meds:.acetaminophen, alum & mag hydroxide-simeth, HYDROmorphone (DILAUDID) injection, metoCLOPramide **OR** metoCLOPramide (REGLAN) injection, ondansetron **OR** ondansetron (ZOFRAN) IV, oxyCODONE-acetaminophen, sodium chloride   Antibiotics   Anti-infectives    Start     Dose/Rate Route Frequency Ordered Stop   05/07/15 2200  ceftaroline (TEFLARO) 300 mg in sodium chloride 0.9 % 250 mL IVPB     300 mg 250 mL/hr over 60 Minutes Intravenous Every 12 hours 05/07/15 1552     05/06/15 0900  fluconazole (DIFLUCAN) tablet 150 mg     150 mg Oral  Once 05/06/15 0840 05/06/15 1234   05/03/15 2200  piperacillin-tazobactam (ZOSYN) IVPB 2.25 g  Status:  Discontinued     2.25 g 100 mL/hr over 30 Minutes Intravenous 3 times per day 05/03/15 1514 05/07/15 1552   05/03/15 1000  linezolid (ZYVOX) IVPB 600 mg  Status:  Discontinued     600 mg 300 mL/hr over 60 Minutes Intravenous Every 12 hours 05/03/15 0752 05/06/15 1609   05/03/15 0800  DAPTOmycin (CUBICIN) 1,000 mg in sodium chloride 0.9 % IVPB  Status:  Discontinued     1,000 mg 240 mL/hr over 30 Minutes Intravenous Every 48 hours 05/02/15 1515 05/03/15 0751   05/01/15 0200  cefTRIAXone (ROCEPHIN) 2 g in dextrose 5 % 50 mL IVPB - Premix  Status:  Discontinued     2 g 100 mL/hr over 30 Minutes Intravenous Every 24 hours 04/30/15 0427 04/30/15 0808   04/30/15 1900  ceFAZolin (ANCEF) 3 g in dextrose 5 % 50 mL IVPB     3 g 160 mL/hr over 30 Minutes Intravenous  Once 04/30/15 1854 04/30/15 1946   04/30/15 1200  vancomycin  (VANCOCIN) 1,500 mg in sodium chloride 0.9 % 500 mL IVPB  Status:  Discontinued     1,500 mg 250 mL/hr over 120 Minutes Intravenous Every 8 hours 04/30/15 0436 05/01/15 1350   04/30/15 0830  piperacillin-tazobactam (ZOSYN) IVPB 3.375 g  Status:  Discontinued     3.375 g 12.5 mL/hr over 240 Minutes Intravenous 3 times per day 04/30/15 0824 05/03/15 1514   04/30/15 0445  vancomycin (VANCOCIN) 1,500 mg in sodium chloride 0.9 % 500 mL IVPB     1,500 mg 250 mL/hr over 120 Minutes Intravenous  Once 04/30/15 0436 04/30/15 0707  04/30/15 0315  cefTRIAXone (ROCEPHIN) 2 g in dextrose 5 % 50 mL IVPB     2 g 100 mL/hr over 30 Minutes Intravenous  Once 04/30/15 0312 04/30/15 0433   04/30/15 0115  vancomycin (VANCOCIN) IVPB 1000 mg/200 mL premix     1,000 mg 200 mL/hr over 60 Minutes Intravenous  Once 04/30/15 0111 04/30/15 0302        Subjective:   Lindsay [name] was seen and examined today. some pain in R shoulder, exercising R arm    Objective:   Blood pressure 128/67, pulse 108, temperature 99.1 F (37.3 C), temperature source Oral, resp. rate 18, height 5\' 8"  (1.727 m), weight 159.666 kg (352 lb), last menstrual period 04/23/2015, SpO2 100 %.  Wt Readings from Last 3 Encounters:  04/30/15 159.666 kg (352 lb)  04/03/15 156.945 kg (346 lb)  02/13/15 154.949 kg (341 lb 9.6 oz)     Intake/Output Summary (Last 24 hours) at 05/08/15 1450 Last data filed at 05/08/15 1241  Gross per 24 hour  Intake   1700 ml  Output   4451 ml  Net  -2751 ml    Exam  GeneralIs  NAD, AAOx3, no distress  Neck: Supple, no JVD, no masses  CVS: S1 S2 /RRR  Respiratory Clear to auscultation bilaterally  Abdomen: Soft, nontender, nondistended, + bowel sounds, fungal infection, abdominal skin folds. Breast folds.   Ext: no cyanosis clubbing or edema, dressing intact on the right chest and shoulder . LE with plus 2 edema.   Skin:  dressing intact on the right chest and shoulder    Data Review     Micro Results Recent Results (from the past 240 hour(s))  Surgical pcr screen     Status: Abnormal   Collection Time: 04/30/15  3:23 PM  Result Value Ref Range Status   MRSA, PCR NEGATIVE NEGATIVE Final   Staphylococcus aureus POSITIVE (A) NEGATIVE Final    Comment:        The Xpert SA Assay (FDA approved for NASAL specimens in patients over 51 years of age), is one component of a comprehensive surveillance program.  Test performance has been validated by [facility] for patients greater than or equal to 54 year old. It is not intended to diagnose infection nor to guide or monitor treatment.   Anaerobic culture     Status: None (Preliminary result)   Collection Time: 04/30/15  7:41 PM  Result Value Ref Range Status   Specimen Description WOUND RIGHT SHOULDER  Final   Special Requests RIGHT SHOULDER  Final   Gram Stain PENDING  Incomplete   Culture   Final    No Anaerobes Isolated; Culture in Progress for 14 DAYS Performed at [facility]    Report Status PENDING  Incomplete  Culture, routine-abscess     Status: None   Collection Time: 04/30/15  7:41 PM  Result Value Ref Range Status   Specimen Description ABSCESS RIGHT SHOULDER  Final   Special Requests PATIENT ON FOLLOWING VANCOMYCIN ZOSYN  Final   Gram Stain   Final    NO WBC SEEN NO SQUAMOUS EPITHELIAL CELLS SEEN NO ORGANISMS SEEN Performed at [facility]    Culture   Final    NO GROWTH 3 DAYS Performed at [facility]    Report Status 05/04/2015 FINAL  Final  Anaerobic culture     Status: None   Collection Time: 04/30/15  7:47 PM  Result Value Ref Range Status   Specimen Description  ABSCESS  DRAINAGE FROM RIGHT SHOULDER  Final   Special Requests PATIENT ON FOLLOWING VANCOMYCIN ZOSYN B  Final   Gram Stain   Final    NO WBC SEEN NO SQUAMOUS EPITHELIAL CELLS SEEN NO ORGANISMS SEEN Performed at [facility]    Culture   Final    NO ANAEROBES ISOLATED Performed at [facility]    Report Status 05/05/2015 FINAL  Final  Culture, routine-abscess     Status: None   Collection Time: 04/30/15  7:47 PM  Result Value Ref Range Status   Specimen Description ABSCESS RIGHT SHOULDER DRAINAGE  Final   Special Requests   Final    PATIENT ON FOLLOWING VANCOMYCIN ZOSYN SPECIMEN B 2 CUPS   Gram Stain   Final    NO WBC SEEN NO SQUAMOUS EPITHELIAL CELLS SEEN NO ORGANISMS SEEN Performed at [facility]    Culture   Final    NO GROWTH 3 DAYS Performed at [facility]    Report Status 05/04/2015 FINAL  Final  Anaerobic culture     Status: None (Preliminary result)   Collection Time: 04/30/15  7:48 PM  Result Value Ref Range Status   Specimen Description FLUID RIGHT SHOULDER JOINT  Final   Special Requests PATIENT ON FOLLOWING VANCOMYCIN ZOSYN C  Final   Gram Stain   Final    RARE WBC PRESENT, PREDOMINANTLY PMN NO ORGANISMS SEEN Performed at [facility]   Final    No Anaerobes Isolated; Culture in Progress for 14 DAYS Performed at [facility]    Report Status PENDING  Incomplete  Body fluid culture     Status: None   Collection Time: 04/30/15  7:48 PM  Result Value Ref Range Status   Specimen Description FLUID RIGHT SHOULDER JOINT  Final   Special Requests PATIENT ON FOLLOWING VANC,ZOSYN  Final   Gram Stain   Final    RARE WBC PRESENT, PREDOMINANTLY PMN NO ORGANISMS SEEN Performed at [facility]    Culture   Final    NO GROWTH 3 DAYS Performed at [facility]    Report Status 05/04/2015 FINAL  Final    Radiology Reports Dg Shoulder Right  04/19/2015   CLINICAL DATA:  Acute onset of right shoulder pain. Heard right shoulder pop 2 days ago. Initial encounter.  EXAM: RIGHT SHOULDER - 2+ VIEW  COMPARISON:  None.  FINDINGS: There is no evidence of fracture or dislocation. The right humeral head is seated within the glenoid fossa. The acromioclavicular joint is unremarkable in appearance.  No significant soft tissue abnormalities are seen. The visualized portions of the right lung are clear.  IMPRESSION: No evidence of fracture or dislocation.   Electronically Signed   By: Roanna Raider M.D.   On: 04/19/2015 01:26   Ct Shoulder Right Wo Contrast  04/27/2015   CLINICAL DATA:  Constant right shoulder pain for 10 days  EXAM: CT OF THE RIGHT SHOULDER WITHOUT CONTRAST  TECHNIQUE: Multidetector CT imaging was performed according to the standard protocol. Multiplanar CT image reconstructions were also generated.  COMPARISON:  Radiographs 04/19/2015  FINDINGS: There is no fracture or dislocation. There is no bone lesion. There is mild osteolysis of the distal clavicle. This can be seen after trauma or with inflammatory arthropathies. It can be idiopathic. No soft tissue mass is evident. There is no abnormal fluid collection. Visible portions of the right ribs and chest wall appear unremarkable.  IMPRESSION:  Osteolysis of the distal right clavicle, uncertain chronicity.   Electronically Signed   By: Ellery Plunk M.D.   On: 04/27/2015 03:43   US Renal  05/02/2015   CLINICAL DATA:  Acute renal failure  EXAM: RENAL / URINARY TRACT ULTRASOUND COMPLETE  COMPARISON:  None.  FINDINGS: Right Kidney:  Length: 13.4 cm. Echogenicity within normal limits. No mass or hydronephrosis visualized.  Left Kidney:  Length: 12.8 cm. Echogenicity within normal limits. No mass or hydronephrosis visualized.  Bladder:  Empty and therefore not well evaluated  IMPRESSION: Study limited by body habitus but appears normal.   Electronically Signed   By: Esperanza Heir M.D.   On: 05/02/2015 16:47   Mr Shoulder Right Wo Contrast  04/30/2015   CLINICAL DATA:  Right shoulder pain. Recent history of antibiotic use. Patient was in too much pain to continue with the contrasted portion of the exam.  EXAM: MRI OF THE RIGHT SHOULDER WITHOUT CONTRAST  TECHNIQUE: Multiplanar, multisequence MR imaging of the shoulder was performed. No  intravenous contrast was administered.  COMPARISON:  None.  FINDINGS: Rotator cuff: Mild tendinosis of the supraspinatus and infraspinatus tendon. Teres minor tendon is intact. Subscapularis tendon is intact.  Muscles: There is severe muscle edema in the subscapularis muscle with small 5 x 6 x 25 mm fluid collection. There is severe muscle edema in the anterior and posterior deltoid muscles with a 14 mm fluid collection in the deltoid muscle. There is soft tissue edema superficial to the subscapularis and infraspinatus muscles. There is mild muscle edema along the periphery of the teres minor muscle.  Biceps long head:  Intact.  Acromioclavicular Joint: Mild degenerative change of the acromioclavicular joint. Type II acromion.  Glenohumeral Joint: Large joint effusion.  No chondral defect.  Labrum:  Intact.  Bones: No focal marrow signal abnormality. No fracture or dislocation.  IMPRESSION: 1. Severe muscle edema in the subscapularis muscle with small 5 x 6 x 25 mm fluid collection. There is severe muscle edema in the anterior and posterior deltoid muscles with a 14 mm fluid collection in the deltoid muscle. Soft tissue edema superficial to the subscapularis and infraspinatus muscles. Mild muscle edema along the periphery of the teres minor muscle. The overall appearance is most concerning for myositis which may be secondary to an infectious or inflammatory etiology. The small fluid collections make the process more concerning for an infectious etiology. 2. Large glenohumeral joint effusion. Septic arthritis cannot be excluded.   Electronically Signed   By: Elige Ko   On: 04/30/2015 10:45   Ir US Guide Vasc Access Left  05/04/2015   CLINICAL DATA:  Cellulitis  EXAM: TUNNELED LEFT INTERNAL JUGULAR PICC LINE PLACEMENT WITH ULTRASOUND AND FLUOROSCOPIC GUIDANCE  FLUOROSCOPY TIME:  12 seconds.  PROCEDURE: The patient was advised of the possible risks andcomplications and agreed to undergo the procedure. The  patient was then brought to the angiographic suite for the procedure.  The left neck was prepped with chlorhexidine, drapedin the usual sterile fashion using maximum barrier technique (cap and mask, sterile gown, sterile gloves, large sterile sheet, hand hygiene and cutaneous antisepsis) and infiltrated locally with 1% Lidocaine.  Ultrasound demonstrated patency of the left jugular vein, and this was documented with an image. Under real-time ultrasound guidance, this vein was accessed with a 21 gauge micropuncture needle and image documentation was performed. A long tract was utilized to create a tunnel. A 0.018 wire was introduced in to the vein. Over this, a 5 [place] single  lumen Power tunneled PICC was advanced to the lower SVC/right atrial junction. The cuff was positioned in the subcutaneous tract. Fluoroscopy during the procedure and fluoro spot radiograph confirms appropriate catheter position. The catheter was flushed and covered with asterile dressing.  Catheter length: 24 cm  COMPLICATIONS: None  IMPRESSION: Successful left jugular tunneled power PICC line placement with ultrasound and fluoroscopic guidance. The catheter is ready for use.   Electronically Signed   By: Jolaine Click M.D.   On: 05/04/2015 08:07   Ir Fluoro Guide Cv Midline Picc Left  05/04/2015   CLINICAL DATA:  Cellulitis  EXAM: TUNNELED LEFT INTERNAL JUGULAR PICC LINE PLACEMENT WITH ULTRASOUND AND FLUOROSCOPIC GUIDANCE  FLUOROSCOPY TIME:  12 seconds.  PROCEDURE: The patient was advised of the possible risks andcomplications and agreed to undergo the procedure. The patient was then brought to the angiographic suite for the procedure.  The left neck was prepped with chlorhexidine, drapedin the usual sterile fashion using maximum barrier technique (cap and mask, sterile gown, sterile gloves, large sterile sheet, hand hygiene and cutaneous antisepsis) and infiltrated locally with 1% Lidocaine.  Ultrasound demonstrated patency of the left  jugular vein, and this was documented with an image. Under real-time ultrasound guidance, this vein was accessed with a 21 gauge micropuncture needle and image documentation was performed. A long tract was utilized to create a tunnel. A 0.018 wire was introduced in to the vein. Over this, a 5 [place] single lumen Power tunneled PICC was advanced to the lower SVC/right atrial junction. The cuff was positioned in the subcutaneous tract. Fluoroscopy during the procedure and fluoro spot radiograph confirms appropriate catheter position. The catheter was flushed and covered with asterile dressing.  Catheter length: 24 cm  COMPLICATIONS: None  IMPRESSION: Successful left jugular tunneled power PICC line placement with ultrasound and fluoroscopic guidance. The catheter is ready for use.   Electronically Signed   By: Jolaine Click M.D.   On: 05/04/2015 08:07    CBC  Recent Labs Lab 05/04/15 1018 05/05/15 0534 05/06/15 0435 05/07/15 0443 05/08/15 0511  WBC 12.1* 12.6* 11.2* 12.3* 11.0*  HGB 8.4* 8.0* 7.8* 7.7* 7.6*  HCT 26.4* 25.4* 25.0* 24.7* 25.2*  PLT 358 422* 456* 501* 536*  MCV 75.6* 75.8* 76.9* 77.2* 79.0  MCH 24.1* 23.9* 24.0* 24.1* 23.8*  MCHC 31.8 31.5 31.2 31.2 30.2  RDW 15.1 15.3 15.5 15.5 15.7*    Chemistries   Recent Labs Lab 05/04/15 0728 05/05/15 0534 05/06/15 0435 05/07/15 0443 05/08/15 0511  NA 127* 129* 133* 132* 136  K 4.1 4.3 4.0 4.4 4.7  CL 86* 87* 91* 91* 96*  CO2 26 28 30 29 31   GLUCOSE 148* 115* 158* 182* 270*  BUN 62* 63* 64* 59* 52*  CREATININE 5.89* 5.81* 5.58* 5.09* 4.26*  CALCIUM 7.2* 7.2* 7.2* 7.3* 7.6*   ------------------------------------------------------------------------------------------------------------------ estimated creatinine clearance is 30.3 mL/min (by C-G formula based on Cr of 4.26). ------------------------------------------------------------------------------------------------------------------ No results for input(s): HGBA1C in the  last 72 hours. ------------------------------------------------------------------------------------------------------------------ No results for input(s): CHOL, HDL, LDLCALC, TRIG, CHOLHDL, LDLDIRECT in the last 72 hours. ------------------------------------------------------------------------------------------------------------------ No results for input(s): TSH, T4TOTAL, T3FREE, THYROIDAB in the last 72 hours.  Invalid input(s): FREET3 ------------------------------------------------------------------------------------------------------------------  Recent Labs  05/07/15 1355  TIBC 199*  IRON 29    Coagulation profile No results for input(s): INR, PROTIME in the last 168 hours.  No results for input(s): DDIMER in the last 72 hours.  Cardiac Enzymes No results for input(s): CKMB, TROPONINI, MYOGLOBIN in the  last 168 hours.  Invalid input(s): CK ------------------------------------------------------------------------------------------------------------------ Invalid input(s): POCBNP   Recent Labs  05/07/15 0621 05/07/15 1117 05/07/15 1654 05/07/15 2143 05/08/15 0639 05/08/15 1157  GLUCAP 169* 168* 110* 192* 239* 232Zannie Cove M.D. [facility] 05/08/2015, 2:50 PM  Pager: [phone]  After 7pm go to [email or web address] - password [email or web address]  Call night coverage person covering after 7pm

## 2015-05-09 LAB — RENAL FUNCTION PANEL
Albumin: 1.6 g/dL — ABNORMAL LOW (ref 3.5–5.0)
Anion gap: 10 (ref 5–15)
BUN: 41 mg/dL — AB (ref 6–20)
CALCIUM: 7.6 mg/dL — AB (ref 8.9–10.3)
CHLORIDE: 98 mmol/L — AB (ref 101–111)
CO2: 30 mmol/L (ref 22–32)
Creatinine, Ser: 3.3 mg/dL — ABNORMAL HIGH (ref 0.44–1.00)
GFR calc Af Amer: 20 mL/min — ABNORMAL LOW (ref >60)
GFR, EST NON AFRICAN AMERICAN: 17 mL/min — AB (ref >60)
GLUCOSE: 206 mg/dL — AB (ref 65–99)
Phosphorus: 4.5 mg/dL (ref 2.5–4.6)
Potassium: 4.6 mmol/L (ref 3.5–5.1)
Sodium: 138 mmol/L (ref 135–145)

## 2015-05-09 LAB — PREPARE RBC (CROSSMATCH)

## 2015-05-09 LAB — CBC
HCT: 24.8 % — ABNORMAL LOW (ref 36.0–46.0)
Hemoglobin: 7.4 g/dL — ABNORMAL LOW (ref 12.0–15.0)
MCH: 23.8 pg — ABNORMAL LOW (ref 26.0–34.0)
MCHC: 29.8 g/dL — AB (ref 30.0–36.0)
MCV: 79.7 fL (ref 78.0–100.0)
PLATELETS: 457 10*3/uL — AB (ref 150–400)
RBC: 3.11 MIL/uL — ABNORMAL LOW (ref 3.87–5.11)
RDW: 16 % — ABNORMAL HIGH (ref 11.5–15.5)
WBC: 8.8 10*3/uL (ref 4.0–10.5)

## 2015-05-09 LAB — GLUCOSE, CAPILLARY
GLUCOSE-CAPILLARY: 159 mg/dL — AB (ref 65–99)
Glucose-Capillary: 167 mg/dL — ABNORMAL HIGH (ref 65–99)
Glucose-Capillary: 151 mg/dL — ABNORMAL HIGH (ref 65–99)
Glucose-Capillary: 104 mg/dL — ABNORMAL HIGH (ref 65–99)

## 2015-05-09 LAB — ABO/RH: ABO/RH(D): O POS

## 2015-05-09 MED ORDER — GABAPENTIN 100 MG PO CAPS
200.0000 mg | ORAL_CAPSULE | Freq: Three times a day (TID) | ORAL | Status: DC
  Administered 2015-05-09 – 2015-05-14 (×16): 200 mg via ORAL
  Filled 2015-05-09 (×16): qty 2

## 2015-05-09 MED ORDER — PREGABALIN 50 MG PO CAPS
50.0000 mg | ORAL_CAPSULE | Freq: Two times a day (BID) | ORAL | Status: DC
  Administered 2015-05-09 – 2015-05-14 (×11): 50 mg via ORAL
  Filled 2015-05-09 (×11): qty 1

## 2015-05-09 MED ORDER — SODIUM CHLORIDE 0.9 % IV SOLN: Freq: Once | INTRAVENOUS | Status: DC

## 2015-05-09 MED ORDER — SODIUM CHLORIDE 0.9 % IV SOLN
400.0000 mg | Freq: Two times a day (BID) | INTRAVENOUS | Status: DC
  Administered 2015-05-09 – 2015-05-10 (×2): 400 mg via INTRAVENOUS
  Filled 2015-05-09 (×4): qty 400

## 2015-05-09 NOTE — Progress Notes (Signed)
     Subjective:  POD# 8 Right shoulder I/D. Patient reports pain as mild to moderate.  Patient still complains of some intermittent numbness in the right hand but feels that it is improving daily.  Biggest complaint is the burning and numbness in the feet bilaterally.  She is having trouble sleeping due to the discomfort. Currently on IV abx.  No changes overnight.   Objective:   VITALS:   Filed Vitals:   05/08/15 0640 05/08/15 1321 05/08/15 2121 05/09/15 0524  BP: 127/70 128/67 142/92 130/71  Pulse: 99 108 101 95  Temp: 97.6 F (36.4 C) 99.1 F (37.3 C) 99.6 F (37.6 C) 98.7 F (37.1 C)  TempSrc:   Oral Oral  Resp: 17 18 18 18   Height:      Weight:      SpO2: 100% 100% 100% 97%    Neurologically intact ABD soft Neurovascular intact Sensation intact distally Intact pulses distally Dorsiflexion/Plantar flexion intact Incision: dressing C/D/I   Lab Results  Component Value Date   WBC 8.8 05/09/2015   HGB 7.4* 05/09/2015   HCT 24.8* 05/09/2015   MCV 79.7 05/09/2015   PLT 457* 05/09/2015   BMET    Component Value Date/Time   NA 138 05/09/2015 0458   K 4.6 05/09/2015 0458   CL 98* 05/09/2015 0458   CO2 30 05/09/2015 0458   GLUCOSE 206* 05/09/2015 0458   BUN 41* 05/09/2015 0458   CREATININE 3.30* 05/09/2015 0458   CREATININE 0.60 02/13/2015 1607   CALCIUM 7.6* 05/09/2015 0458   GFRNONAA 17* 05/09/2015 0458   GFRNONAA >89 02/13/2015 1607   GFRAA 20* 05/09/2015 0458   GFRAA >89 02/13/2015 1607     Assessment/Plan: 9 Days Post-Op   Principal Problem:   Cellulitis of left upper extremity Active Problems:   Diabetes mellitus due to underlying condition without complications   Obesity   HTN (hypertension)   Hyponatremia   Abscess   Abscess of right shoulder   ARF (acute renal failure)   Septic arthritis   Cellulitis of right upper arm   Up with therapy WBAT in the RUE, sling for comfort Continue abx per ID Will continue to monitor numbness in the  R hand, hopeful that this will continue to improve as inflammation trends down Will follow up with the patient at our outpatient clinic after discharge   Burk Hoctor Marie 05/09/2015, 8:44 AM Cell [phone]

## 2015-05-09 NOTE — Progress Notes (Signed)
Called blood bank sample not ready for bl transfusion to call when ready

## 2015-05-09 NOTE — Progress Notes (Signed)
[facility]                                                                              Patient Demographics  Lindsay [name], is a 34 y.o. female, DOB - [birth year], ZOX:[id number]  Admit date - 04/30/2015   Admitting Physician Houston Siren, MD  Outpatient Primary MD for the patient is Doris Cheadle, MD  LOS - 9   Chief Complaint  Patient presents with  . Shoulder Pain       Brief HPI   The patient is a 34 year old female with obesity, asthma, diabetes, hypertension presented to ED with right shoulder worsening pain. Patient was recently seen in the ED and had a small I&D done in ER and was placed on doxycycline. Patient however continued to have increased pain but no subjective fevers or chills. Evaluation in ED included a marked leukocytosis with WBC of 18 K and elevated blood sugar in 400s. Patient was started on IV vancomycin and hospitalist was asked to admit for cellulitis and hyperglycemia.   Assessment & Plan    Cellulitis/Abscess of right shoulder and chest wall abscess/myositis -postop day # 9 - MRI of the right shoulder which showed severe muscle edema with small 5 x 6x 25 mm fluid collection overall appearance concerning for myositis due to infectious or inflammatory etiology, large glenohumeral joint effusion, possible septic arthritis - underwent irrigation and debridement of the right shoulder with arthroscopy and patient was also found to have right chest wall subcutaneous abscess and Dr. Derrell Lolling was consulted intraoperatively. Patient underwent I&D of the right chest wall abscess as well, on 04-30-2015. - Creatinine function noticed to be trending up, vancomycin was discontinued. ID consult was obtained, patient seen by Dr. Ninetta Lights on 5/12, recommended to change to Zyvox and Zosyn. Zyvox stopped 5-16 due to concern for neuropathy.  -day 7 of IV antibiotics, now on Teflaro Day3 -PT and OT consulted. needs HH.  -follow up ID recommendations regarding  length of antibiotics, once creatinine continues to trend down could likely place PICC   Encephalopathy; -resolved -Suspect multifactorial; metabolic acidosis, renal failure, infectious process.   -gabapentin dose decreased to 100 mg TID due to renal failure.   Anemia -due to iron defi and acute illness -due to Acute illness, AKI, and heavy mentrual cycles -transfuse 1 unit PRBC -given IV iron 5/18  Diabetes mellitus due to underlying condition :  - Blood sugars in 200s now.  - Continue lantus, increase dose to 70 units  Acute kidney injury: - suspect ATN due to sepsis, Vanc toxicity,. Baseline creatinine normal - Creatinine peaked at 5.8, improving now 3.2, brisk urine output -Vancomycin was discontinued and renal consulted, renal ultrasound showed no renal obstruction. -fluids stopped  Right upper extremity swelling - doppler negative for DVT  Severe peripheral neuropathy- from DM -add lyrica, on gabapentin at reduced dose due to AKI  Obesity - Patient counseled on diet and weight control  HTN (hypertension) -Currently BP borderline stable, not on any antihypertensive  Hyponatremia: resolved, related to renal failure and pseudohyponatremia from hyperglycemia.   Patient also had some bleeding from the site of heparin shots - no significant  bleeding. Resume heparin.   Skin, maceration, skin candidiasis;  Nystatin powder ordered.  Wound care nurse.    Code Status: Full code  Family Communication: Discussed in detail with the patient, all imaging results, lab results explained to the patient. Disposition Plan: Not medically ready   Time Spent in minutes   25 minutes  Procedures  Irrigation and debridement of the right shoulder   I&D of the chest wall 5-10  Consults   Orthopedics, Dr. Eulah Pont   general surgery, Dr. Derrell Lolling  infectious disease Nephrology  DVT Prophylaxis SCDs . No significant bleeding, restarted heparin.   Medications  Scheduled Meds: .  sodium chloride   Intravenous Once  . bisacodyl  10 mg Rectal Once  . ceFTAROline (TEFLARO) IV  400 mg Intravenous Q12H  . docusate sodium  100 mg Oral BID  . ferrous sulfate  325 mg Oral BID WC  . gabapentin  200 mg Oral TID  . heparin subcutaneous  5,000 Units Subcutaneous 3 times per day  . insulin aspart  0-20 Units Subcutaneous TID WC  . insulin aspart  0-5 Units Subcutaneous QHS  . insulin aspart  10 Units Subcutaneous TID WC  . insulin glargine  70 Units Subcutaneous Daily  . nystatin   Topical TID  . polyethylene glycol  17 g Oral Daily  . pregabalin  50 mg Oral BID  . Vitamin D (Ergocalciferol)  50,000 Units Oral Q7 days   Continuous Infusions:   PRN Meds:.acetaminophen, alum & mag hydroxide-simeth, HYDROmorphone (DILAUDID) injection, metoCLOPramide **OR** metoCLOPramide (REGLAN) injection, ondansetron **OR** ondansetron (ZOFRAN) IV, oxyCODONE-acetaminophen, sodium chloride   Antibiotics   Anti-infectives    Start     Dose/Rate Route Frequency Ordered Stop   05/09/15 2200  ceftaroline (TEFLARO) 400 mg in sodium chloride 0.9 % 250 mL IVPB     400 mg 250 mL/hr over 60 Minutes Intravenous Every 12 hours 05/09/15 1145     05/07/15 2200  ceftaroline (TEFLARO) 300 mg in sodium chloride 0.9 % 250 mL IVPB  Status:  Discontinued     300 mg 250 mL/hr over 60 Minutes Intravenous Every 12 hours 05/07/15 1552 05/09/15 1145   05/06/15 0900  fluconazole (DIFLUCAN) tablet 150 mg     150 mg Oral  Once 05/06/15 0840 05/06/15 1234   05/03/15 2200  piperacillin-tazobactam (ZOSYN) IVPB 2.25 g  Status:  Discontinued     2.25 g 100 mL/hr over 30 Minutes Intravenous 3 times per day 05/03/15 1514 05/07/15 1552   05/03/15 1000  linezolid (ZYVOX) IVPB 600 mg  Status:  Discontinued     600 mg 300 mL/hr over 60 Minutes Intravenous Every 12 hours 05/03/15 0752 05/06/15 1609   05/03/15 0800  DAPTOmycin (CUBICIN) 1,000 mg in sodium chloride 0.9 % IVPB  Status:  Discontinued     1,000 mg 240 mL/hr  over 30 Minutes Intravenous Every 48 hours 05/02/15 1515 05/03/15 0751   05/01/15 0200  cefTRIAXone (ROCEPHIN) 2 g in dextrose 5 % 50 mL IVPB - Premix  Status:  Discontinued     2 g 100 mL/hr over 30 Minutes Intravenous Every 24 hours 04/30/15 0427 04/30/15 0808   04/30/15 1900  ceFAZolin (ANCEF) 3 g in dextrose 5 % 50 mL IVPB     3 g 160 mL/hr over 30 Minutes Intravenous  Once 04/30/15 1854 04/30/15 1946   04/30/15 1200  vancomycin (VANCOCIN) 1,500 mg in sodium chloride 0.9 % 500 mL IVPB  Status:  Discontinued     1,500 mg  250 mL/hr over 120 Minutes Intravenous Every 8 hours 04/30/15 0436 05/01/15 1350   04/30/15 0830  piperacillin-tazobactam (ZOSYN) IVPB 3.375 g  Status:  Discontinued     3.375 g 12.5 mL/hr over 240 Minutes Intravenous 3 times per day 04/30/15 0824 05/03/15 1514   04/30/15 0445  vancomycin (VANCOCIN) 1,500 mg in sodium chloride 0.9 % 500 mL IVPB     1,500 mg 250 mL/hr over 120 Minutes Intravenous  Once 04/30/15 0436 04/30/15 0707   04/30/15 0315  cefTRIAXone (ROCEPHIN) 2 g in dextrose 5 % 50 mL IVPB     2 g 100 mL/hr over 30 Minutes Intravenous  Once 04/30/15 0312 04/30/15 0433   04/30/15 0115  vancomycin (VANCOCIN) IVPB 1000 mg/200 mL premix     1,000 mg 200 mL/hr over 60 Minutes Intravenous  Once 04/30/15 0111 04/30/15 0302        Subjective:   Lindsay [name] was seen and examined today. some pain in R shoulder, exercising R arm    Objective:   Blood pressure 130/71, pulse 95, temperature 98.7 F (37.1 C), temperature source Oral, resp. rate 18, height 5\' 8"  (1.727 m), weight 159.666 kg (352 lb), last menstrual period 04/23/2015, SpO2 97 %.  Wt Readings from Last 3 Encounters:  04/30/15 159.666 kg (352 lb)  04/03/15 156.945 kg (346 lb)  02/13/15 154.949 kg (341 lb 9.6 oz)     Intake/Output Summary (Last 24 hours) at 05/09/15 1212 Last data filed at 05/09/15 1125  Gross per 24 hour  Intake    830 ml  Output   5052 ml  Net  -4222 ml     Exam  GeneralIs  NAD, AAOx3, no distress, tearful  Neck: Supple, no JVD, no masses  CVS: S1 S2 /RRR  Respiratory Clear to auscultation bilaterally  Abdomen: Soft, nontender, nondistended, + bowel sounds, fungal infection, abdominal skin folds. Breast folds.   Ext: no cyanosis clubbing or edema, dressing intact on the right chest and shoulder . LE with plus 1 edema.   Skin:  dressing intact on the right chest and shoulder    Data Review   Micro Results Recent Results (from the past 240 hour(s))  Surgical pcr screen     Status: Abnormal   Collection Time: 04/30/15  3:23 PM  Result Value Ref Range Status   MRSA, PCR NEGATIVE NEGATIVE Final   Staphylococcus aureus POSITIVE (A) NEGATIVE Final    Comment:        The Xpert SA Assay (FDA approved for NASAL specimens in patients over 81 years of age), is one component of a comprehensive surveillance program.  Test performance has been validated by [facility] for patients greater than or equal to 5 year old. It is not intended to diagnose infection nor to guide or monitor treatment.   Anaerobic culture     Status: None (Preliminary result)   Collection Time: 04/30/15  7:41 PM  Result Value Ref Range Status   Specimen Description WOUND RIGHT SHOULDER  Final   Special Requests RIGHT SHOULDER  Final   Gram Stain PENDING  Incomplete   Culture   Final    No Anaerobes Isolated; Culture in Progress for 14 DAYS Performed at [facility]    Report Status PENDING  Incomplete  Culture, routine-abscess     Status: None   Collection Time: 04/30/15  7:41 PM  Result Value Ref Range Status   Specimen Description ABSCESS RIGHT SHOULDER  Final   Special Requests PATIENT ON FOLLOWING  VANCOMYCIN ZOSYN  Final   Gram Stain   Final    NO WBC SEEN NO SQUAMOUS EPITHELIAL CELLS SEEN NO ORGANISMS SEEN Performed at [facility]    Culture   Final    NO GROWTH 3 DAYS Performed at [facility]    Report  Status 05/04/2015 FINAL  Final  Anaerobic culture     Status: None   Collection Time: 04/30/15  7:47 PM  Result Value Ref Range Status   Specimen Description ABSCESS  DRAINAGE FROM RIGHT SHOULDER  Final   Special Requests PATIENT ON FOLLOWING VANCOMYCIN ZOSYN B  Final   Gram Stain   Final    NO WBC SEEN NO SQUAMOUS EPITHELIAL CELLS SEEN NO ORGANISMS SEEN Performed at [facility]    Culture   Final    NO ANAEROBES ISOLATED Performed at [facility]    Report Status 05/05/2015 FINAL  Final  Culture, routine-abscess     Status: None   Collection Time: 04/30/15  7:47 PM  Result Value Ref Range Status   Specimen Description ABSCESS RIGHT SHOULDER DRAINAGE  Final   Special Requests   Final    PATIENT ON FOLLOWING VANCOMYCIN ZOSYN SPECIMEN B 2 CUPS   Gram Stain   Final    NO WBC SEEN NO SQUAMOUS EPITHELIAL CELLS SEEN NO ORGANISMS SEEN Performed at [facility]    Culture   Final    NO GROWTH 3 DAYS Performed at [facility]    Report Status 05/04/2015 FINAL  Final  Anaerobic culture     Status: None (Preliminary result)   Collection Time: 04/30/15  7:48 PM  Result Value Ref Range Status   Specimen Description FLUID RIGHT SHOULDER JOINT  Final   Special Requests PATIENT ON FOLLOWING VANCOMYCIN ZOSYN C  Final   Gram Stain   Final    RARE WBC PRESENT, PREDOMINANTLY PMN NO ORGANISMS SEEN Performed at [facility]   Final    No Anaerobes Isolated; Culture in Progress for 14 DAYS Performed at [facility]    Report Status PENDING  Incomplete  Body fluid culture     Status: None   Collection Time: 04/30/15  7:48 PM  Result Value Ref Range Status   Specimen Description FLUID RIGHT SHOULDER JOINT  Final   Special Requests PATIENT ON FOLLOWING VANC,ZOSYN  Final   Gram Stain   Final    RARE WBC PRESENT, PREDOMINANTLY PMN NO ORGANISMS SEEN Performed at [facility]    Culture   Final    NO GROWTH 3  DAYS Performed at [facility]    Report Status 05/04/2015 FINAL  Final    Radiology Reports Dg Shoulder Right  04/19/2015   CLINICAL DATA:  Acute onset of right shoulder pain. Heard right shoulder pop 2 days ago. Initial encounter.  EXAM: RIGHT SHOULDER - 2+ VIEW  COMPARISON:  None.  FINDINGS: There is no evidence of fracture or dislocation. The right humeral head is seated within the glenoid fossa. The acromioclavicular joint is unremarkable in appearance. No significant soft tissue abnormalities are seen. The visualized portions of the right lung are clear.  IMPRESSION: No evidence of fracture or dislocation.   Electronically Signed   By: Roanna Raider M.D.   On: 04/19/2015 01:26   Ct Shoulder Right Wo Contrast  04/27/2015   CLINICAL DATA:  Constant right shoulder pain for 10 days  EXAM: CT OF THE RIGHT SHOULDER WITHOUT  CONTRAST  TECHNIQUE: Multidetector CT imaging was performed according to the standard protocol. Multiplanar CT image reconstructions were also generated.  COMPARISON:  Radiographs 04/19/2015  FINDINGS: There is no fracture or dislocation. There is no bone lesion. There is mild osteolysis of the distal clavicle. This can be seen after trauma or with inflammatory arthropathies. It can be idiopathic. No soft tissue mass is evident. There is no abnormal fluid collection. Visible portions of the right ribs and chest wall appear unremarkable.  IMPRESSION: Osteolysis of the distal right clavicle, uncertain chronicity.   Electronically Signed   By: Ellery Plunk M.D.   On: 04/27/2015 03:43   US Renal  05/02/2015   CLINICAL DATA:  Acute renal failure  EXAM: RENAL / URINARY TRACT ULTRASOUND COMPLETE  COMPARISON:  None.  FINDINGS: Right Kidney:  Length: 13.4 cm. Echogenicity within normal limits. No mass or hydronephrosis visualized.  Left Kidney:  Length: 12.8 cm. Echogenicity within normal limits. No mass or hydronephrosis visualized.  Bladder:  Empty and therefore not well  evaluated  IMPRESSION: Study limited by body habitus but appears normal.   Electronically Signed   By: Esperanza Heir M.D.   On: 05/02/2015 16:47   Mr Shoulder Right Wo Contrast  04/30/2015   CLINICAL DATA:  Right shoulder pain. Recent history of antibiotic use. Patient was in too much pain to continue with the contrasted portion of the exam.  EXAM: MRI OF THE RIGHT SHOULDER WITHOUT CONTRAST  TECHNIQUE: Multiplanar, multisequence MR imaging of the shoulder was performed. No intravenous contrast was administered.  COMPARISON:  None.  FINDINGS: Rotator cuff: Mild tendinosis of the supraspinatus and infraspinatus tendon. Teres minor tendon is intact. Subscapularis tendon is intact.  Muscles: There is severe muscle edema in the subscapularis muscle with small 5 x 6 x 25 mm fluid collection. There is severe muscle edema in the anterior and posterior deltoid muscles with a 14 mm fluid collection in the deltoid muscle. There is soft tissue edema superficial to the subscapularis and infraspinatus muscles. There is mild muscle edema along the periphery of the teres minor muscle.  Biceps long head:  Intact.  Acromioclavicular Joint: Mild degenerative change of the acromioclavicular joint. Type II acromion.  Glenohumeral Joint: Large joint effusion.  No chondral defect.  Labrum:  Intact.  Bones: No focal marrow signal abnormality. No fracture or dislocation.  IMPRESSION: 1. Severe muscle edema in the subscapularis muscle with small 5 x 6 x 25 mm fluid collection. There is severe muscle edema in the anterior and posterior deltoid muscles with a 14 mm fluid collection in the deltoid muscle. Soft tissue edema superficial to the subscapularis and infraspinatus muscles. Mild muscle edema along the periphery of the teres minor muscle. The overall appearance is most concerning for myositis which may be secondary to an infectious or inflammatory etiology. The small fluid collections make the process more concerning for an  infectious etiology. 2. Large glenohumeral joint effusion. Septic arthritis cannot be excluded.   Electronically Signed   By: Elige Ko   On: 04/30/2015 10:45   Ir US Guide Vasc Access Left  05/04/2015   CLINICAL DATA:  Cellulitis  EXAM: TUNNELED LEFT INTERNAL JUGULAR PICC LINE PLACEMENT WITH ULTRASOUND AND FLUOROSCOPIC GUIDANCE  FLUOROSCOPY TIME:  12 seconds.  PROCEDURE: The patient was advised of the possible risks andcomplications and agreed to undergo the procedure. The patient was then brought to the angiographic suite for the procedure.  The left neck was prepped with chlorhexidine, drapedin the usual sterile  fashion using maximum barrier technique (cap and mask, sterile gown, sterile gloves, large sterile sheet, hand hygiene and cutaneous antisepsis) and infiltrated locally with 1% Lidocaine.  Ultrasound demonstrated patency of the left jugular vein, and this was documented with an image. Under real-time ultrasound guidance, this vein was accessed with a 21 gauge micropuncture needle and image documentation was performed. A long tract was utilized to create a tunnel. A 0.018 wire was introduced in to the vein. Over this, a 5 [place] single lumen Power tunneled PICC was advanced to the lower SVC/right atrial junction. The cuff was positioned in the subcutaneous tract. Fluoroscopy during the procedure and fluoro spot radiograph confirms appropriate catheter position. The catheter was flushed and covered with asterile dressing.  Catheter length: 24 cm  COMPLICATIONS: None  IMPRESSION: Successful left jugular tunneled power PICC line placement with ultrasound and fluoroscopic guidance. The catheter is ready for use.   Electronically Signed   By: Jolaine Click M.D.   On: 05/04/2015 08:07   Ir Fluoro Guide Cv Midline Picc Left  05/04/2015   CLINICAL DATA:  Cellulitis  EXAM: TUNNELED LEFT INTERNAL JUGULAR PICC LINE PLACEMENT WITH ULTRASOUND AND FLUOROSCOPIC GUIDANCE  FLUOROSCOPY TIME:  12 seconds.  PROCEDURE:  The patient was advised of the possible risks andcomplications and agreed to undergo the procedure. The patient was then brought to the angiographic suite for the procedure.  The left neck was prepped with chlorhexidine, drapedin the usual sterile fashion using maximum barrier technique (cap and mask, sterile gown, sterile gloves, large sterile sheet, hand hygiene and cutaneous antisepsis) and infiltrated locally with 1% Lidocaine.  Ultrasound demonstrated patency of the left jugular vein, and this was documented with an image. Under real-time ultrasound guidance, this vein was accessed with a 21 gauge micropuncture needle and image documentation was performed. A long tract was utilized to create a tunnel. A 0.018 wire was introduced in to the vein. Over this, a 5 [place] single lumen Power tunneled PICC was advanced to the lower SVC/right atrial junction. The cuff was positioned in the subcutaneous tract. Fluoroscopy during the procedure and fluoro spot radiograph confirms appropriate catheter position. The catheter was flushed and covered with asterile dressing.  Catheter length: 24 cm  COMPLICATIONS: None  IMPRESSION: Successful left jugular tunneled power PICC line placement with ultrasound and fluoroscopic guidance. The catheter is ready for use.   Electronically Signed   By: Jolaine Click M.D.   On: 05/04/2015 08:07    CBC  Recent Labs Lab 05/05/15 0534 05/06/15 0435 05/07/15 0443 05/08/15 0511 05/09/15 0458  WBC 12.6* 11.2* 12.3* 11.0* 8.8  HGB 8.0* 7.8* 7.7* 7.6* 7.4*  HCT 25.4* 25.0* 24.7* 25.2* 24.8*  PLT 422* 456* 501* 536* 457*  MCV 75.8* 76.9* 77.2* 79.0 79.7  MCH 23.9* 24.0* 24.1* 23.8* 23.8*  MCHC 31.5 31.2 31.2 30.2 29.8*  RDW 15.3 15.5 15.5 15.7* 16.0*    Chemistries   Recent Labs Lab 05/05/15 0534 05/06/15 0435 05/07/15 0443 05/08/15 0511 05/09/15 0458  NA 129* 133* 132* 136 138  K 4.3 4.0 4.4 4.7 4.6  CL 87* 91* 91* 96* 98*  CO2 28 30 29 31 30   GLUCOSE 115* 158*  182* 270* 206*  BUN 63* 64* 59* 52* 41*  CREATININE 5.81* 5.58* 5.09* 4.26* 3.30*  CALCIUM 7.2* 7.2* 7.3* 7.6* 7.6*   ------------------------------------------------------------------------------------------------------------------ estimated creatinine clearance is 39.1 mL/min (by C-G formula based on Cr of 3.3). ------------------------------------------------------------------------------------------------------------------ No results for input(s): HGBA1C in the last 72 hours. ------------------------------------------------------------------------------------------------------------------  No results for input(s): CHOL, HDL, LDLCALC, TRIG, CHOLHDL, LDLDIRECT in the last 72 hours. ------------------------------------------------------------------------------------------------------------------ No results for input(s): TSH, T4TOTAL, T3FREE, THYROIDAB in the last 72 hours.  Invalid input(s): FREET3 ------------------------------------------------------------------------------------------------------------------  Recent Labs  05/07/15 1355  TIBC 199*  IRON 29    Coagulation profile No results for input(s): INR, PROTIME in the last 168 hours.  No results for input(s): DDIMER in the last 72 hours.  Cardiac Enzymes No results for input(s): CKMB, TROPONINI, MYOGLOBIN in the last 168 hours.  Invalid input(s): CK ------------------------------------------------------------------------------------------------------------------ Invalid input(s): POCBNP   Recent Labs  05/08/15 0639 05/08/15 1157 05/08/15 1656 05/08/15 2139 05/09/15 0632 05/09/15 1122  GLUCAP 239* 232* 175* 136* 159* 167Zannie Cove M.D. [facility] 05/09/2015, 12:12 PM  Pager: [phone]  After 7pm go to [email or web address] - password [email or web address]  Call night coverage person covering after 7pm

## 2015-05-09 NOTE — Progress Notes (Addendum)
Occupational Therapy Treatment Patient Details Name: Lindsay [name] MRN: [id number] DOB: [birth year] Today's Date: 05/09/2015    History of present illness Pt is s/p I&D of the right shoulder with arthroscopy and I&D of the right chest wall abscess as well, on 04-30-2015. PMH includes: obesity, asthma, diabetes, hypertension, acute renal failure, septic arthritis.    OT comments  Pt is progressing slowly with RUE therapeutic exercises. Pt c/o burning sensation in Bil feet and despite max encouragement was not agreeable to OOB. Pt with Hgb 7.4 and planned for blood transfusion, so performed bed level activities. Pt participated in therapeutic exercise of RUE and OT provided training for ADLs and safety. Pt was unable to demonstrate ROM exercises without assistance from OT.    Follow Up Recommendations  Supervision/Assistance - 24 hour;Home health OT    Equipment Recommendations  Tub/shower seat    Recommendations for Other Services      Precautions / Restrictions Precautions Precautions: Fall Precaution Comments: skin breakdown under breasts;groin;under pannus Required Braces or Orthoses: Sling (for comfort) Restrictions Weight Bearing Restrictions: No RUE Weight Bearing: Weight bearing as tolerated Other Position/Activity Restrictions: ROM as tolerated       Mobility Bed Mobility               General bed mobility comments: NT  Transfers                 General transfer comment: NT; pt declined due to Bil feet burning        ADL Overall ADL's : Needs assistance/impaired                                       General ADL Comments: Session focused on ROM of RUE and training for self-care when pt returns home. She continues to plan to return home at d/c. Pt participated in therapeutic exercise but declined OOB despite max encouragement due to "burning" sensation in Bil feet today.                 Cognition  Arousal/Alertness:  Lethargic Behavior During Therapy: WFL for tasks assessed/performed Overall Cognitive Status: Within Functional Limits for tasks assessed                         Exercises Shoulder Exercises Shoulder Flexion: AAROM;Right;10 reps;Supine Shoulder Extension: AAROM;Right;10 reps;Supine Shoulder ABduction: AAROM;Right;10 reps;Supine Shoulder External Rotation: AAROM;Right;10 reps;Supine Elbow Flexion: AROM;Right;10 reps;Supine Elbow Extension: AROM;Right;10 reps;Supine Wrist Flexion: AROM;Right;10 reps;Supine Wrist Extension: AROM;Right;10 reps;Supine Digit Composite Flexion: AROM;Right;10 reps;Supine Composite Extension: AROM;Right;10 reps;Supine Sling wearing schedule (on at all times/off for ADL's): Patient able to independently direct caregiver Proper positioning of operated UE when showering: Patient able to independently direct caregiver   Shoulder Instructions Shoulder Instructions Sling wearing schedule (on at all times/off for ADL's): Patient able to independently direct caregiver Proper positioning of operated UE when showering: Patient able to independently direct caregiver          Pertinent Vitals/ Pain       Pain Assessment: Faces Faces Pain Scale: Hurts whole lot Pain Location: R shoulder; Bil feet Pain Descriptors / Indicators: Aching;Burning;Sore Pain Intervention(s): Limited activity within patient's tolerance;Monitored during session;Repositioned         Frequency Min 4X/week     Progress Toward Goals  OT Goals(current goals can now be found in the care plan section)  Progress towards OT  goals: Progressing toward goals     Plan Discharge plan remains appropriate;Frequency needs to be updated       End of Session Equipment Utilized During Treatment: Other (comment) (sling)   Activity Tolerance Patient limited by pain;Patient limited by lethargy;Other (comment) (pt plan to get blood infusion today)   Patient Left in bed;with call bell/phone  within reach;with SCD's reapplied   Nurse Communication Mobility status        Time: 1610-9604 OT Time Calculation (min): 32 min  Charges: OT Treatments $Therapeutic Exercise: 23-37 mins  Rae Lips 05/09/2015, 3:04 PM  Carney Living, OTR/L Occupational Therapist [phone] (pager)

## 2015-05-09 NOTE — Progress Notes (Signed)
INFECTIOUS DISEASE PROGRESS NOTE  ID: Lindsay [name] is a 34 y.o. 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  Intermittent numbness in RUE.   Abtx:  Anti-infectives    Start     Dose/Rate Route Frequency Ordered Stop   05/07/15 2200  ceftaroline (TEFLARO) 300 mg in sodium chloride 0.9 % 250 mL IVPB     300 mg 250 mL/hr over 60 Minutes Intravenous Every 12 hours 05/07/15 1552     05/06/15 0900  fluconazole (DIFLUCAN) tablet 150 mg     150 mg Oral  Once 05/06/15 0840 05/06/15 1234   05/03/15 2200  piperacillin-tazobactam (ZOSYN) IVPB 2.25 g  Status:  Discontinued     2.25 g 100 mL/hr over 30 Minutes Intravenous 3 times per day 05/03/15 1514 05/07/15 1552   05/03/15 1000  linezolid (ZYVOX) IVPB 600 mg  Status:  Discontinued     600 mg 300 mL/hr over 60 Minutes Intravenous Every 12 hours 05/03/15 0752 05/06/15 1609   05/03/15 0800  DAPTOmycin (CUBICIN) 1,000 mg in sodium chloride 0.9 % IVPB  Status:  Discontinued     1,000 mg 240 mL/hr over 30 Minutes Intravenous Every 48 hours 05/02/15 1515 05/03/15 0751   05/01/15 0200  cefTRIAXone (ROCEPHIN) 2 g in dextrose 5 % 50 mL IVPB - Premix  Status:  Discontinued     2 g 100 mL/hr over 30 Minutes Intravenous Every 24 hours 04/30/15 0427 04/30/15 0808   04/30/15 1900  ceFAZolin (ANCEF) 3 g in dextrose 5 % 50 mL IVPB     3 g 160 mL/hr over 30 Minutes Intravenous  Once 04/30/15 1854 04/30/15 1946   04/30/15 1200  vancomycin (VANCOCIN) 1,500 mg in sodium chloride 0.9 % 500 mL IVPB  Status:  Discontinued     1,500 mg 250 mL/hr over 120 Minutes Intravenous Every 8 hours 04/30/15 0436 05/01/15 1350   04/30/15 0830   piperacillin-tazobactam (ZOSYN) IVPB 3.375 g  Status:  Discontinued     3.375 g 12.5 mL/hr over 240 Minutes Intravenous 3 times per day 04/30/15 0824 05/03/15 1514   04/30/15 0445  vancomycin (VANCOCIN) 1,500 mg in sodium chloride 0.9 % 500 mL IVPB     1,500 mg 250 mL/hr over 120 Minutes Intravenous  Once 04/30/15 0436 04/30/15 0707   04/30/15 0315  cefTRIAXone (ROCEPHIN) 2 g in dextrose 5 % 50 mL IVPB     2 g 100 mL/hr over 30 Minutes Intravenous  Once 04/30/15 0312 04/30/15 0433   04/30/15 0115  vancomycin (VANCOCIN) IVPB 1000 mg/200 mL premix     1,000 mg 200 mL/hr over 60 Minutes Intravenous  Once 04/30/15 0111 04/30/15 0302      Medications:  Scheduled: . bisacodyl  10 mg Rectal Once  . ceFTAROline (TEFLARO) IV  300 mg Intravenous Q12H  . docusate sodium  100 mg Oral BID  . ferrous sulfate  325 mg Oral BID WC  . gabapentin  200 mg Oral TID  . heparin subcutaneous  5,000 Units Subcutaneous 3 times per day  . insulin aspart  0-20 Units Subcutaneous TID WC  . insulin aspart  0-5 Units Subcutaneous QHS  .  insulin aspart  10 Units Subcutaneous TID WC  . insulin glargine  70 Units Subcutaneous Daily  . nystatin   Topical TID  . polyethylene glycol  17 g Oral Daily  . Vitamin D (Ergocalciferol)  50,000 Units Oral Q7 days    Objective: Vital signs in last 24 hours: Temp:  [98.7 F (37.1 C)-99.6 F (37.6 C)] 98.7 F (37.1 C) (05/19 0524) Pulse Rate:  [95-108] 95 (05/19 0524) Resp:  [18] 18 (05/19 0524) BP: (128-142)/(67-92) 130/71 mmHg (05/19 0524) SpO2:  [97 %-100 %] 97 % (05/19 0524)   General appearance: alert, cooperative and no distress Chest wall: R shoulder dressed  Lab Results  Recent Labs  05/08/15 0511 05/09/15 0458  WBC 11.0* 8.8  HGB 7.6* 7.4*  HCT 25.2* 24.8*  NA 136 138  K 4.7 4.6  CL 96* 98*  CO2 31 30  BUN 52* 41*  CREATININE 4.26* 3.30*   Liver Panel  Recent Labs  05/08/15 0511 05/09/15 0458  ALBUMIN 1.5* 1.6*   Sedimentation  Rate No results for input(s): ESRSEDRATE in the last 72 hours. C-Reactive Protein No results for input(s): CRP in the last 72 hours.  Microbiology: Recent Results (from the past 240 hour(s))  Surgical pcr screen     Status: Abnormal   Collection Time: 04/30/15  3:23 PM  Result Value Ref Range Status   MRSA, PCR NEGATIVE NEGATIVE Final   Staphylococcus aureus POSITIVE (A) NEGATIVE Final    Comment:        The Xpert SA Assay (FDA approved for NASAL specimens in patients over 44 years of age), is one component of a comprehensive surveillance program.  Test performance has been validated by [facility] for patients greater than or equal to 24 year old. It is not intended to diagnose infection nor to guide or monitor treatment.   Anaerobic culture     Status: None (Preliminary result)   Collection Time: 04/30/15  7:41 PM  Result Value Ref Range Status   Specimen Description WOUND RIGHT SHOULDER  Final   Special Requests RIGHT SHOULDER  Final   Gram Stain PENDING  Incomplete   Culture   Final    No Anaerobes Isolated; Culture in Progress for 14 DAYS Performed at [facility]    Report Status PENDING  Incomplete  Culture, routine-abscess     Status: None   Collection Time: 04/30/15  7:41 PM  Result Value Ref Range Status   Specimen Description ABSCESS RIGHT SHOULDER  Final   Special Requests PATIENT ON FOLLOWING VANCOMYCIN ZOSYN  Final   Gram Stain   Final    NO WBC SEEN NO SQUAMOUS EPITHELIAL CELLS SEEN NO ORGANISMS SEEN Performed at [facility]    Culture   Final    NO GROWTH 3 DAYS Performed at [facility]    Report Status 05/04/2015 FINAL  Final  Anaerobic culture     Status: None   Collection Time: 04/30/15  7:47 PM  Result Value Ref Range Status   Specimen Description ABSCESS  DRAINAGE FROM RIGHT SHOULDER  Final   Special Requests PATIENT ON FOLLOWING VANCOMYCIN ZOSYN B  Final   Gram Stain   Final    NO WBC SEEN NO SQUAMOUS  EPITHELIAL CELLS SEEN NO ORGANISMS SEEN Performed at [facility]    Culture   Final    NO ANAEROBES ISOLATED Performed at [facility]    Report Status 05/05/2015 FINAL  Final  Culture, routine-abscess  Status: None   Collection Time: 04/30/15  7:47 PM  Result Value Ref Range Status   Specimen Description ABSCESS RIGHT SHOULDER DRAINAGE  Final   Special Requests   Final    PATIENT ON FOLLOWING VANCOMYCIN ZOSYN SPECIMEN B 2 CUPS   Gram Stain   Final    NO WBC SEEN NO SQUAMOUS EPITHELIAL CELLS SEEN NO ORGANISMS SEEN Performed at [facility]    Culture   Final    NO GROWTH 3 DAYS Performed at [facility]    Report Status 05/04/2015 FINAL  Final  Anaerobic culture     Status: None (Preliminary result)   Collection Time: 04/30/15  7:48 PM  Result Value Ref Range Status   Specimen Description FLUID RIGHT SHOULDER JOINT  Final   Special Requests PATIENT ON FOLLOWING VANCOMYCIN ZOSYN C  Final   Gram Stain   Final    RARE WBC PRESENT, PREDOMINANTLY PMN NO ORGANISMS SEEN Performed at [facility]   Final    No Anaerobes Isolated; Culture in Progress for 14 DAYS Performed at [facility]    Report Status PENDING  Incomplete  Body fluid culture     Status: None   Collection Time: 04/30/15  7:48 PM  Result Value Ref Range Status   Specimen Description FLUID RIGHT SHOULDER JOINT  Final   Special Requests PATIENT ON FOLLOWING VANC,ZOSYN  Final   Gram Stain   Final    RARE WBC PRESENT, PREDOMINANTLY PMN NO ORGANISMS SEEN Performed at [facility]    Culture   Final    NO GROWTH 3 DAYS Performed at [facility]    Report Status 05/04/2015 FINAL  Final    Studies/Results: No results found.   Assessment/Plan: Septic arthritis (R shoulder), chest wall abscess, myositis Debrided 5-10 Cx (-) DM2 (since 2004) Acute Renal Failure Ashma Obesity (BMI 53.53) LE  edema Protein- albumin malnutrition, severe  Total days of antibiotics: 10 (ceftaroline day 3)  Spoke with lab- her Cx is growing a bacteria. ID and sensi of this are pending.  No change in anbx for now, till we have more info         Johny Sax Infectious Diseases (pager) [phone] [email or web address] 05/09/2015, 11:30 AM  LOS: 9 days

## 2015-05-09 NOTE — Progress Notes (Signed)
Subjective: Interval History: has complaints burning pain in feet at rest and worse up on them..  Pain hands also.  Objective: Vital signs in last 24 hours: Temp:  [98.7 F (37.1 C)-99.6 F (37.6 C)] 98.7 F (37.1 C) (05/19 0524) Pulse Rate:  [95-108] 95 (05/19 0524) Resp:  [18] 18 (05/19 0524) BP: (128-142)/(67-92) 130/71 mmHg (05/19 0524) SpO2:  [97 %-100 %] 97 % (05/19 0524) Weight change:   Intake/Output from previous day: 05/18 0701 - 05/19 0700 In: 1320 [P.O.:720; IV Piggyback:600] Out: 3852 [Urine:3850; Stool:2] Intake/Output this shift:    General appearance: cooperative, mild distress and morbidly obese Resp: diminished breath sounds bilaterally Cardio: S1, S2 normal GI: massive,pos bs, soft Extremities: edema 2 +  Lab Results:  Recent Labs  05/08/15 0511 05/09/15 0458  WBC 11.0* 8.8  HGB 7.6* 7.4*  HCT 25.2* 24.8*  PLT 536* 457*   BMET:  Recent Labs  05/08/15 0511 05/09/15 0458  NA 136 138  K 4.7 4.6  CL 96* 98*  CO2 31 30  GLUCOSE 270* 206*  BUN 52* 41*  CREATININE 4.26* 3.30*  CALCIUM 7.6* 7.6*   No results for input(s): PTH in the last 72 hours. Iron Studies:  Recent Labs  05/07/15 1355  IRON 29  TIBC 199*    Studies/Results: No results found.  I have reviewed the patient's current medications.  Assessment/Plan: 1 AKI improving,  Diuresing. Still some vol xs.  2 shoulder infx on ab, getting rehab 3 Pain in feet and hands neuropathic 6 neurotin 4 morbid obesity 5 DM P ^ neurontin,  Allow to diurese  No PICCs    LOS: 9 days   Seriah Brotzman L 05/09/2015,8:41 AM

## 2015-05-09 NOTE — Progress Notes (Signed)
[facility]  Patient Status: New pt for [facility] this admission  [facility] is providing the following services: HHRN and home infusion pharmacy team for home IV ABX.  WIll provide other services as ordered upon DC. [facility] hospital team will provide in hospital teaching re: IV ABX administration to support independence at home. We will follow with MD team and support DC home when ordered..   If patient discharges after hours, please call [phone].   Sedalia Muta 05/09/2015, 8:12 AM

## 2015-05-09 NOTE — Progress Notes (Signed)
9 Days Post-Op  Subjective: C/O foot pain B  Objective: Vital signs in last 24 hours: Temp:  [98.7 F (37.1 C)-99.6 F (37.6 C)] 98.7 F (37.1 C) (05/19 0524) Pulse Rate:  [95-108] 95 (05/19 0524) Resp:  [18] 18 (05/19 0524) BP: (128-142)/(67-92) 130/71 mmHg (05/19 0524) SpO2:  [97 %-100 %] 97 % (05/19 0524) Last BM Date: 05/08/15  Intake/Output from previous day: 05/18 0701 - 05/19 0700 In: 1320 [P.O.:720; IV Piggyback:600] Out: 3852 [Urine:3850; Stool:2] Intake/Output this shift:    Tearful R chest wound with packing and some drainage  Lab Results:   Recent Labs  05/08/15 0511 05/09/15 0458  WBC 11.0* 8.8  HGB 7.6* 7.4*  HCT 25.2* 24.8*  PLT 536* 457*   BMET  Recent Labs  05/08/15 0511 05/09/15 0458  NA 136 138  K 4.7 4.6  CL 96* 98*  CO2 31 30  GLUCOSE 270* 206*  BUN 52* 41*  CREATININE 4.26* 3.30*  CALCIUM 7.6* 7.6*   PT/INR No results for input(s): LABPROT, INR in the last 72 hours. ABG No results for input(s): PHART, HCO3 in the last 72 hours.  Invalid input(s): PCO2, PO2  Studies/Results: No results found.  Anti-infectives: Anti-infectives    Start     Dose/Rate Route Frequency Ordered Stop   05/07/15 2200  ceftaroline (TEFLARO) 300 mg in sodium chloride 0.9 % 250 mL IVPB     300 mg 250 mL/hr over 60 Minutes Intravenous Every 12 hours 05/07/15 1552     05/06/15 0900  fluconazole (DIFLUCAN) tablet 150 mg     150 mg Oral  Once 05/06/15 0840 05/06/15 1234   05/03/15 2200  piperacillin-tazobactam (ZOSYN) IVPB 2.25 g  Status:  Discontinued     2.25 g 100 mL/hr over 30 Minutes Intravenous 3 times per day 05/03/15 1514 05/07/15 1552   05/03/15 1000  linezolid (ZYVOX) IVPB 600 mg  Status:  Discontinued     600 mg 300 mL/hr over 60 Minutes Intravenous Every 12 hours 05/03/15 0752 05/06/15 1609   05/03/15 0800  DAPTOmycin (CUBICIN) 1,000 mg in sodium chloride 0.9 % IVPB  Status:  Discontinued     1,000 mg 240 mL/hr over 30 Minutes Intravenous  Every 48 hours 05/02/15 1515 05/03/15 0751   05/01/15 0200  cefTRIAXone (ROCEPHIN) 2 g in dextrose 5 % 50 mL IVPB - Premix  Status:  Discontinued     2 g 100 mL/hr over 30 Minutes Intravenous Every 24 hours 04/30/15 0427 04/30/15 0808   04/30/15 1900  ceFAZolin (ANCEF) 3 g in dextrose 5 % 50 mL IVPB     3 g 160 mL/hr over 30 Minutes Intravenous  Once 04/30/15 1854 04/30/15 1946   04/30/15 1200  vancomycin (VANCOCIN) 1,500 mg in sodium chloride 0.9 % 500 mL IVPB  Status:  Discontinued     1,500 mg 250 mL/hr over 120 Minutes Intravenous Every 8 hours 04/30/15 0436 05/01/15 1350   04/30/15 0830  piperacillin-tazobactam (ZOSYN) IVPB 3.375 g  Status:  Discontinued     3.375 g 12.5 mL/hr over 240 Minutes Intravenous 3 times per day 04/30/15 0824 05/03/15 1514   04/30/15 0445  vancomycin (VANCOCIN) 1,500 mg in sodium chloride 0.9 % 500 mL IVPB     1,500 mg 250 mL/hr over 120 Minutes Intravenous  Once 04/30/15 0436 04/30/15 0707   04/30/15 0315  cefTRIAXone (ROCEPHIN) 2 g in dextrose 5 % 50 mL IVPB     2 g 100 mL/hr over 30 Minutes Intravenous  Once  04/30/15 0312 04/30/15 0433   04/30/15 0115  vancomycin (VANCOCIN) IVPB 1000 mg/200 mL premix     1,000 mg 200 mL/hr over 60 Minutes Intravenous  Once 04/30/15 0111 04/30/15 0302      Assessment/Plan: Right chest wall subcutaneous abscess -Dressing changes BID WD with 1/2 inch iodoform gauze packing strips -Antibiotics adjusted per ID - appreciate their F/U -F/U 2 weeks after D/C with Dr. Derrell Lolling Right shoulder abscess - per Dr. 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

## 2015-05-10 DIAGNOSIS — L02213 Cutaneous abscess of chest wall: Secondary | ICD-10-CM

## 2015-05-10 DIAGNOSIS — E43 Unspecified severe protein-calorie malnutrition: Secondary | ICD-10-CM

## 2015-05-10 DIAGNOSIS — R600 Localized edema: Secondary | ICD-10-CM

## 2015-05-10 LAB — RENAL FUNCTION PANEL
ALBUMIN: 1.7 g/dL — AB (ref 3.5–5.0)
ANION GAP: 9 (ref 5–15)
BUN: 32 mg/dL — ABNORMAL HIGH (ref 6–20)
CO2: 29 mmol/L (ref 22–32)
CREATININE: 2.61 mg/dL — AB (ref 0.44–1.00)
Calcium: 8.7 mg/dL — ABNORMAL LOW (ref 8.9–10.3)
Chloride: 101 mmol/L (ref 101–111)
GFR calc Af Amer: 27 mL/min — ABNORMAL LOW (ref >60)
GFR, EST NON AFRICAN AMERICAN: 23 mL/min — AB (ref >60)
Glucose, Bld: 94 mg/dL (ref 65–99)
POTASSIUM: 4.7 mmol/L (ref 3.5–5.1)
Phosphorus: 4.8 mg/dL — ABNORMAL HIGH (ref 2.5–4.6)
Sodium: 139 mmol/L (ref 135–145)

## 2015-05-10 LAB — GLUCOSE, CAPILLARY
GLUCOSE-CAPILLARY: 161 mg/dL — AB (ref 65–99)
Glucose-Capillary: 132 mg/dL — ABNORMAL HIGH (ref 65–99)
Glucose-Capillary: 167 mg/dL — ABNORMAL HIGH (ref 65–99)
Glucose-Capillary: 125 mg/dL — ABNORMAL HIGH (ref 65–99)

## 2015-05-10 LAB — TYPE AND SCREEN
ABO/RH(D): O POS
ANTIBODY SCREEN: NEGATIVE
Unit division: 0

## 2015-05-10 LAB — CBC
HEMATOCRIT: 29.4 % — AB (ref 36.0–46.0)
Hemoglobin: 8.8 g/dL — ABNORMAL LOW (ref 12.0–15.0)
MCH: 24.4 pg — ABNORMAL LOW (ref 26.0–34.0)
MCHC: 29.9 g/dL — AB (ref 30.0–36.0)
MCV: 81.7 fL (ref 78.0–100.0)
PLATELETS: 510 10*3/uL — AB (ref 150–400)
RBC: 3.6 MIL/uL — ABNORMAL LOW (ref 3.87–5.11)
RDW: 15.9 % — ABNORMAL HIGH (ref 11.5–15.5)
WBC: 10.2 10*3/uL (ref 4.0–10.5)

## 2015-05-10 MED ORDER — SODIUM CHLORIDE 0.9 % IV SOLN
600.0000 mg | Freq: Two times a day (BID) | INTRAVENOUS | Status: DC
  Administered 2015-05-10 – 2015-05-13 (×6): 600 mg via INTRAVENOUS
  Filled 2015-05-10 (×7): qty 600

## 2015-05-10 NOTE — Progress Notes (Signed)
Physical Therapy Treatment Patient Details Name: Lindsay [name] MRN: [id number] DOB: [birth year] Today's Date: 05/10/2015    History of Present Illness Pt is s/p I&D of the right shoulder with arthroscopy and I&D of the right chest wall abscess as well, on 04-30-2015. PMH includes: obesity, asthma, diabetes, hypertension, acute renal failure, septic arthritis.     PT Comments    Patient a tad more motivated this session to work with therapy. Able to walk unit. Still requires assistance out of bed and for standing but making progress. Continue with current POC  Follow Up Recommendations  Home health PT;Supervision/Assistance - 24 hour     Equipment Recommendations  Cane    Recommendations for Other Services       Precautions / Restrictions Precautions Precautions: Fall Precaution Comments: skin breakdown under breasts;groin;under pannus Restrictions RUE Weight Bearing: Weight bearing as tolerated Other Position/Activity Restrictions: ROM as tolerated    Mobility  Bed Mobility Overal bed mobility: Needs Assistance Bed Mobility: Sit to Supine     Supine to sit: Min assist        Transfers Overall transfer level: Needs assistance Equipment used: None   Sit to Stand: Min assist         General transfer comment: Min A to power up into standing. patient with safe technique and used momentum  Ambulation/Gait Ambulation/Gait assistance: Supervision Ambulation Distance (Feet): 600 Feet Assistive device: None (IV pole) Gait Pattern/deviations: Wide base of support Gait velocity: decreased   General Gait Details: Pt using IV pole   Stairs            Wheelchair Mobility    Modified Rankin (Stroke Patients Only)       Balance                                    Cognition Arousal/Alertness: Awake/alert Behavior During Therapy: WFL for tasks assessed/performed Overall Cognitive Status: Within Functional Limits for tasks assessed                      Exercises      General Comments        Pertinent Vitals/Pain Pain Score: 8  Pain Location: R shoulder : bil feet Pain Descriptors / Indicators: Aching;Burning;Sore Pain Intervention(s): Monitored during session;Repositioned    Home Living                      Prior Function            PT Goals (current goals can now be found in the care plan section) Progress towards PT goals: Progressing toward goals    Frequency  Min 3X/week    PT Plan Current plan remains appropriate    Co-evaluation             End of Session   Activity Tolerance: Patient tolerated treatment well Patient left: in chair;with call bell/phone within reach     Time: 1129-1156 PT Time Calculation (min) (ACUTE ONLY): 27 min  Charges:  $Gait Training: 23-37 mins                    G Codes:      Fredrich Birks 05/10/2015, 2:50 PM  05/10/2015 Fredrich Birks PTA [phone] pager [phone] office

## 2015-05-10 NOTE — Progress Notes (Signed)
[facility]                                                                              Patient Demographics  Lindsay [name], is a 34 y.o. female, DOB - 02/25/1981, ZOX:[id number]  Admit date - 04/30/2015   Admitting Physician Houston Siren, MD  Outpatient Primary MD for the patient is Doris Cheadle, MD  LOS - 10   Chief Complaint  Patient presents with  . Shoulder Pain       Brief HPI   The patient is a 34 year old female with obesity, asthma, diabetes, hypertension presented to ED with right shoulder worsening pain. Patient was recently seen in the ED and had a small I&D done in ER and was placed on doxycycline. Patient however continued to have increased pain but no subjective fevers or chills. Evaluation in ED included a marked leukocytosis with WBC of 18 K and elevated blood sugar in 400s. Patient was started on IV vancomycin and hospitalist was asked to admit for cellulitis and hyperglycemia.   Assessment & Plan    Cellulitis/Abscess of right shoulder and chest wall abscess/myositis -postop day # 10 - MRI of the right shoulder which showed severe muscle edema with small 5 x 6x 25 mm fluid collection overall appearance concerning for myositis due to infectious or inflammatory etiology, large glenohumeral joint effusion, possible septic arthritis - underwent irrigation and debridement of the right shoulder with arthroscopy and patient was also found to have right chest wall subcutaneous abscess and Dr. Derrell Lolling was consulted intraoperatively. Patient underwent I&D of the right chest wall abscess as well, on 04-30-2015. - Creatinine function noticed to be trending up, vancomycin was discontinued. ID consult was obtained, patient seen by Dr. Ninetta Lights on 5/12, recommended to change to Zyvox and Zosyn. Zyvox stopped 5-16 due to concern for neuropathy.  -day 7 of IV antibiotics, now on Teflaro Day4 -PT and OT consulted. needs HH.  -follow up ID recommendations regarding  length of antibiotics, once creatinine continues to trend down could likely place PICC   Encephalopathy; -resolved -Suspect multifactorial; metabolic acidosis, renal failure, infectious process.   -gabapentin dose decreased to 100 mg TID due to renal failure.   Anemia -due to iron defi and acute illness -due to Acute illness, AKI, and heavy mentrual cycles -transfused 1 unit PRBC 5/19, improved now, monitor -given IV iron 5/18  Diabetes mellitus due to underlying condition :  - Blood sugars in 200s now.  - Continue lantus, increase dose to 70 units  Acute kidney injury: - suspect ATN due to sepsis, Vanc toxicity,. Baseline creatinine normal - Creatinine peaked at 5.8, improving now 2.6, brisk urine output - Vancomycin was discontinued and renal consulted, renal ultrasound showed no renal obstruction. - still auto diuresing  Right upper extremity swelling - doppler negative for DVT  Severe peripheral neuropathy- from DM -improving, continue lyrica, on gabapentin at reduced dose due to AKI  Obesity - Patient counseled on diet and weight control  HTN (hypertension) -Currently BP borderline stable, not on any antihypertensive  Hyponatremia: resolved, related to renal failure and pseudohyponatremia from hyperglycemia.   Patient also had some bleeding from  the site of heparin shots - no significant bleeding. Resume heparin.   Skin, maceration, skin candidiasis;  Nystatin powder ordered.  Wound care nurse.    Code Status: Full code  Family Communication: Discussed in detail with the patient, all imaging results, lab results explained to the patient. Disposition Plan: home in 2-3days   Time Spent in minutes   25 minutes  Procedures  Irrigation and debridement of the right shoulder  I&D of the chest wall 5-10  Consults   Orthopedics, Dr. Eulah Pont  general surgery, Dr. Derrell Lolling infectious disease Nephrology  DVT Prophylaxis SCDs . No significant bleeding, restarted  heparin.   Medications  Scheduled Meds: . sodium chloride   Intravenous Once  . bisacodyl  10 mg Rectal Once  . ceFTAROline (TEFLARO) IV  600 mg Intravenous Q12H  . docusate sodium  100 mg Oral BID  . ferrous sulfate  325 mg Oral BID WC  . gabapentin  200 mg Oral TID  . heparin subcutaneous  5,000 Units Subcutaneous 3 times per day  . insulin aspart  0-20 Units Subcutaneous TID WC  . insulin aspart  0-5 Units Subcutaneous QHS  . insulin aspart  10 Units Subcutaneous TID WC  . insulin glargine  70 Units Subcutaneous Daily  . nystatin   Topical TID  . polyethylene glycol  17 g Oral Daily  . pregabalin  50 mg Oral BID  . Vitamin D (Ergocalciferol)  50,000 Units Oral Q7 days   Continuous Infusions:   PRN Meds:.acetaminophen, alum & mag hydroxide-simeth, HYDROmorphone (DILAUDID) injection, metoCLOPramide **OR** metoCLOPramide (REGLAN) injection, ondansetron **OR** ondansetron (ZOFRAN) IV, oxyCODONE-acetaminophen, sodium chloride   Antibiotics   Anti-infectives    Start     Dose/Rate Route Frequency Ordered Stop   05/10/15 2200  ceftaroline (TEFLARO) 600 mg in sodium chloride 0.9 % 250 mL IVPB     600 mg 250 mL/hr over 60 Minutes Intravenous Every 12 hours 05/10/15 1226     05/09/15 2200  ceftaroline (TEFLARO) 400 mg in sodium chloride 0.9 % 250 mL IVPB  Status:  Discontinued     400 mg 250 mL/hr over 60 Minutes Intravenous Every 12 hours 05/09/15 1145 05/10/15 1226   05/07/15 2200  ceftaroline (TEFLARO) 300 mg in sodium chloride 0.9 % 250 mL IVPB  Status:  Discontinued     300 mg 250 mL/hr over 60 Minutes Intravenous Every 12 hours 05/07/15 1552 05/09/15 1145   05/06/15 0900  fluconazole (DIFLUCAN) tablet 150 mg     150 mg Oral  Once 05/06/15 0840 05/06/15 1234   05/03/15 2200  piperacillin-tazobactam (ZOSYN) IVPB 2.25 g  Status:  Discontinued     2.25 g 100 mL/hr over 30 Minutes Intravenous 3 times per day 05/03/15 1514 05/07/15 1552   05/03/15 1000  linezolid (ZYVOX) IVPB 600  mg  Status:  Discontinued     600 mg 300 mL/hr over 60 Minutes Intravenous Every 12 hours 05/03/15 0752 05/06/15 1609   05/03/15 0800  DAPTOmycin (CUBICIN) 1,000 mg in sodium chloride 0.9 % IVPB  Status:  Discontinued     1,000 mg 240 mL/hr over 30 Minutes Intravenous Every 48 hours 05/02/15 1515 05/03/15 0751   05/01/15 0200  cefTRIAXone (ROCEPHIN) 2 g in dextrose 5 % 50 mL IVPB - Premix  Status:  Discontinued     2 g 100 mL/hr over 30 Minutes Intravenous Every 24 hours 04/30/15 0427 04/30/15 0808   04/30/15 1900  ceFAZolin (ANCEF) 3 g in dextrose 5 % 50 mL IVPB  3 g 160 mL/hr over 30 Minutes Intravenous  Once 04/30/15 1854 04/30/15 1946   04/30/15 1200  vancomycin (VANCOCIN) 1,500 mg in sodium chloride 0.9 % 500 mL IVPB  Status:  Discontinued     1,500 mg 250 mL/hr over 120 Minutes Intravenous Every 8 hours 04/30/15 0436 05/01/15 1350   04/30/15 0830  piperacillin-tazobactam (ZOSYN) IVPB 3.375 g  Status:  Discontinued     3.375 g 12.5 mL/hr over 240 Minutes Intravenous 3 times per day 04/30/15 0824 05/03/15 1514   04/30/15 0445  vancomycin (VANCOCIN) 1,500 mg in sodium chloride 0.9 % 500 mL IVPB     1,500 mg 250 mL/hr over 120 Minutes Intravenous  Once 04/30/15 0436 04/30/15 0707   04/30/15 0315  cefTRIAXone (ROCEPHIN) 2 g in dextrose 5 % 50 mL IVPB     2 g 100 mL/hr over 30 Minutes Intravenous  Once 04/30/15 0312 04/30/15 0433   04/30/15 0115  vancomycin (VANCOCIN) IVPB 1000 mg/200 mL premix     1,000 mg 200 mL/hr over 60 Minutes Intravenous  Once 04/30/15 0111 04/30/15 0302        Subjective:   Maricel Sen[place] feels much better today, legs less painful   Objective:   Blood pressure 135/87, pulse 89, temperature 98.4 F (36.9 C), temperature source Oral, resp. rate 18, height 5\' 8"  (1.727 m), weight 159.666 kg (352 lb), last menstrual period 04/23/2015, SpO2 100 %.  Wt Readings from Last 3 Encounters:  04/30/15 159.666 kg (352 lb)  04/03/15 156.945 kg (346 lb)    02/13/15 154.949 kg (341 lb 9.6 oz)     Intake/Output Summary (Last 24 hours) at 05/10/15 1321 Last data filed at 05/10/15 1100  Gross per 24 hour  Intake    630 ml  Output   4400 ml  Net  -3770 ml    Exam  GeneralIs  NAD, AAOx3, morbidly obese  Neck: Supple, no JVD, no masses  CVS: S1 S2 /RRR  Respiratory Clear to auscultation bilaterally  Abdomen: Soft, nontender, nondistended, + bowel sounds, fungal infection, abdominal skin folds. Breast folds.   Ext: no cyanosis clubbing or edema, dressing intact on the right chest and shoulder . LE with plus 1 edema.   Skin:  dressing intact on the right chest and shoulder    Data Review   Micro Results Recent Results (from the past 240 hour(s))  Surgical pcr screen     Status: Abnormal   Collection Time: 04/30/15  3:23 PM  Result Value Ref Range Status   MRSA, PCR NEGATIVE NEGATIVE Final   Staphylococcus aureus POSITIVE (A) NEGATIVE Final    Comment:        The Xpert SA Assay (FDA approved for NASAL specimens in patients over 35 years of age), is one component of a comprehensive surveillance program.  Test performance has been validated by [facility] for patients greater than or equal to 57 year old. It is not intended to diagnose infection nor to guide or monitor treatment.   Anaerobic culture     Status: None (Preliminary result)   Collection Time: 04/30/15  7:41 PM  Result Value Ref Range Status   Specimen Description WOUND RIGHT SHOULDER  Final   Special Requests RIGHT SHOULDER  Final   Gram Stain PENDING  Incomplete   Culture   Final    No Anaerobes Isolated; Culture in Progress for 14 DAYS Performed at [facility]    Report Status PENDING  Incomplete  Culture, routine-abscess  Status: None   Collection Time: 04/30/15  7:41 PM  Result Value Ref Range Status   Specimen Description ABSCESS RIGHT SHOULDER  Final   Special Requests PATIENT ON FOLLOWING VANCOMYCIN ZOSYN  Final   Gram Stain    Final    NO WBC SEEN NO SQUAMOUS EPITHELIAL CELLS SEEN NO ORGANISMS SEEN Performed at [facility]    Culture   Final    NO GROWTH 3 DAYS Performed at [facility]    Report Status 05/04/2015 FINAL  Final  Anaerobic culture     Status: None   Collection Time: 04/30/15  7:47 PM  Result Value Ref Range Status   Specimen Description ABSCESS  DRAINAGE FROM RIGHT SHOULDER  Final   Special Requests PATIENT ON FOLLOWING VANCOMYCIN ZOSYN B  Final   Gram Stain   Final    NO WBC SEEN NO SQUAMOUS EPITHELIAL CELLS SEEN NO ORGANISMS SEEN Performed at [facility]    Culture   Final    NO ANAEROBES ISOLATED Performed at [facility]    Report Status 05/05/2015 FINAL  Final  Culture, routine-abscess     Status: None   Collection Time: 04/30/15  7:47 PM  Result Value Ref Range Status   Specimen Description ABSCESS RIGHT SHOULDER DRAINAGE  Final   Special Requests   Final    PATIENT ON FOLLOWING VANCOMYCIN ZOSYN SPECIMEN B 2 CUPS   Gram Stain   Final    NO WBC SEEN NO SQUAMOUS EPITHELIAL CELLS SEEN NO ORGANISMS SEEN Performed at [facility]    Culture   Final    NO GROWTH 3 DAYS Performed at [facility]    Report Status 05/04/2015 FINAL  Final  Anaerobic culture     Status: None (Preliminary result)   Collection Time: 04/30/15  7:48 PM  Result Value Ref Range Status   Specimen Description FLUID RIGHT SHOULDER JOINT  Final   Special Requests PATIENT ON FOLLOWING VANCOMYCIN ZOSYN C  Final   Gram Stain   Final    RARE WBC PRESENT, PREDOMINANTLY PMN NO ORGANISMS SEEN Performed at [facility]   Final    No Anaerobes Isolated; Culture in Progress for 14 DAYS Performed at [facility]    Report Status PENDING  Incomplete  Body fluid culture     Status: None   Collection Time: 04/30/15  7:48 PM  Result Value Ref Range Status   Specimen Description FLUID RIGHT SHOULDER JOINT  Final   Special Requests  PATIENT ON FOLLOWING VANC,ZOSYN  Final   Gram Stain   Final    RARE WBC PRESENT, PREDOMINANTLY PMN NO ORGANISMS SEEN Performed at [facility]    Culture   Final    NO GROWTH 3 DAYS Performed at [facility]    Report Status 05/04/2015 FINAL  Final    Radiology Reports Dg Shoulder Right  04/19/2015   CLINICAL DATA:  Acute onset of right shoulder pain. Heard right shoulder pop 2 days ago. Initial encounter.  EXAM: RIGHT SHOULDER - 2+ VIEW  COMPARISON:  None.  FINDINGS: There is no evidence of fracture or dislocation. The right humeral head is seated within the glenoid fossa. The acromioclavicular joint is unremarkable in appearance. No significant soft tissue abnormalities are seen. The visualized portions of the right lung are clear.  IMPRESSION: No evidence of fracture or dislocation.   Electronically Signed   By: Beryle Beams.D.  On: 04/19/2015 01:26   Ct Shoulder Right Wo Contrast  04/27/2015   CLINICAL DATA:  Constant right shoulder pain for 10 days  EXAM: CT OF THE RIGHT SHOULDER WITHOUT CONTRAST  TECHNIQUE: Multidetector CT imaging was performed according to the standard protocol. Multiplanar CT image reconstructions were also generated.  COMPARISON:  Radiographs 04/19/2015  FINDINGS: There is no fracture or dislocation. There is no bone lesion. There is mild osteolysis of the distal clavicle. This can be seen after trauma or with inflammatory arthropathies. It can be idiopathic. No soft tissue mass is evident. There is no abnormal fluid collection. Visible portions of the right ribs and chest wall appear unremarkable.  IMPRESSION: Osteolysis of the distal right clavicle, uncertain chronicity.   Electronically Signed   By: Ellery Plunk M.D.   On: 04/27/2015 03:43   US Renal  05/02/2015   CLINICAL DATA:  Acute renal failure  EXAM: RENAL / URINARY TRACT ULTRASOUND COMPLETE  COMPARISON:  None.  FINDINGS: Right Kidney:  Length: 13.4 cm. Echogenicity within normal  limits. No mass or hydronephrosis visualized.  Left Kidney:  Length: 12.8 cm. Echogenicity within normal limits. No mass or hydronephrosis visualized.  Bladder:  Empty and therefore not well evaluated  IMPRESSION: Study limited by body habitus but appears normal.   Electronically Signed   By: Esperanza Heir M.D.   On: 05/02/2015 16:47   Mr Shoulder Right Wo Contrast  04/30/2015   CLINICAL DATA:  Right shoulder pain. Recent history of antibiotic use. Patient was in too much pain to continue with the contrasted portion of the exam.  EXAM: MRI OF THE RIGHT SHOULDER WITHOUT CONTRAST  TECHNIQUE: Multiplanar, multisequence MR imaging of the shoulder was performed. No intravenous contrast was administered.  COMPARISON:  None.  FINDINGS: Rotator cuff: Mild tendinosis of the supraspinatus and infraspinatus tendon. Teres minor tendon is intact. Subscapularis tendon is intact.  Muscles: There is severe muscle edema in the subscapularis muscle with small 5 x 6 x 25 mm fluid collection. There is severe muscle edema in the anterior and posterior deltoid muscles with a 14 mm fluid collection in the deltoid muscle. There is soft tissue edema superficial to the subscapularis and infraspinatus muscles. There is mild muscle edema along the periphery of the teres minor muscle.  Biceps long head:  Intact.  Acromioclavicular Joint: Mild degenerative change of the acromioclavicular joint. Type II acromion.  Glenohumeral Joint: Large joint effusion.  No chondral defect.  Labrum:  Intact.  Bones: No focal marrow signal abnormality. No fracture or dislocation.  IMPRESSION: 1. Severe muscle edema in the subscapularis muscle with small 5 x 6 x 25 mm fluid collection. There is severe muscle edema in the anterior and posterior deltoid muscles with a 14 mm fluid collection in the deltoid muscle. Soft tissue edema superficial to the subscapularis and infraspinatus muscles. Mild muscle edema along the periphery of the teres minor muscle. The  overall appearance is most concerning for myositis which may be secondary to an infectious or inflammatory etiology. The small fluid collections make the process more concerning for an infectious etiology. 2. Large glenohumeral joint effusion. Septic arthritis cannot be excluded.   Electronically Signed   By: Elige Ko   On: 04/30/2015 10:45   Ir US Guide Vasc Access Left  05/04/2015   CLINICAL DATA:  Cellulitis  EXAM: TUNNELED LEFT INTERNAL JUGULAR PICC LINE PLACEMENT WITH ULTRASOUND AND FLUOROSCOPIC GUIDANCE  FLUOROSCOPY TIME:  12 seconds.  PROCEDURE: The patient was advised of the possible  risks andcomplications and agreed to undergo the procedure. The patient was then brought to the angiographic suite for the procedure.  The left neck was prepped with chlorhexidine, drapedin the usual sterile fashion using maximum barrier technique (cap and mask, sterile gown, sterile gloves, large sterile sheet, hand hygiene and cutaneous antisepsis) and infiltrated locally with 1% Lidocaine.  Ultrasound demonstrated patency of the left jugular vein, and this was documented with an image. Under real-time ultrasound guidance, this vein was accessed with a 21 gauge micropuncture needle and image documentation was performed. A long tract was utilized to create a tunnel. A 0.018 wire was introduced in to the vein. Over this, a 5 [place] single lumen Power tunneled PICC was advanced to the lower SVC/right atrial junction. The cuff was positioned in the subcutaneous tract. Fluoroscopy during the procedure and fluoro spot radiograph confirms appropriate catheter position. The catheter was flushed and covered with asterile dressing.  Catheter length: 24 cm  COMPLICATIONS: None  IMPRESSION: Successful left jugular tunneled power PICC line placement with ultrasound and fluoroscopic guidance. The catheter is ready for use.   Electronically Signed   By: Jolaine Click M.D.   On: 05/04/2015 08:07   Ir Fluoro Guide Cv Midline Picc  Left  05/04/2015   CLINICAL DATA:  Cellulitis  EXAM: TUNNELED LEFT INTERNAL JUGULAR PICC LINE PLACEMENT WITH ULTRASOUND AND FLUOROSCOPIC GUIDANCE  FLUOROSCOPY TIME:  12 seconds.  PROCEDURE: The patient was advised of the possible risks andcomplications and agreed to undergo the procedure. The patient was then brought to the angiographic suite for the procedure.  The left neck was prepped with chlorhexidine, drapedin the usual sterile fashion using maximum barrier technique (cap and mask, sterile gown, sterile gloves, large sterile sheet, hand hygiene and cutaneous antisepsis) and infiltrated locally with 1% Lidocaine.  Ultrasound demonstrated patency of the left jugular vein, and this was documented with an image. Under real-time ultrasound guidance, this vein was accessed with a 21 gauge micropuncture needle and image documentation was performed. A long tract was utilized to create a tunnel. A 0.018 wire was introduced in to the vein. Over this, a 5 [place] single lumen Power tunneled PICC was advanced to the lower SVC/right atrial junction. The cuff was positioned in the subcutaneous tract. Fluoroscopy during the procedure and fluoro spot radiograph confirms appropriate catheter position. The catheter was flushed and covered with asterile dressing.  Catheter length: 24 cm  COMPLICATIONS: None  IMPRESSION: Successful left jugular tunneled power PICC line placement with ultrasound and fluoroscopic guidance. The catheter is ready for use.   Electronically Signed   By: Jolaine Click M.D.   On: 05/04/2015 08:07    CBC  Recent Labs Lab 05/06/15 0435 05/07/15 0443 05/08/15 0511 05/09/15 0458 05/10/15 0446  WBC 11.2* 12.3* 11.0* 8.8 10.2  HGB 7.8* 7.7* 7.6* 7.4* 8.8*  HCT 25.0* 24.7* 25.2* 24.8* 29.4*  PLT 456* 501* 536* 457* 510*  MCV 76.9* 77.2* 79.0 79.7 81.7  MCH 24.0* 24.1* 23.8* 23.8* 24.4*  MCHC 31.2 31.2 30.2 29.8* 29.9*  RDW 15.5 15.5 15.7* 16.0* 15.9*    Chemistries   Recent Labs Lab  05/06/15 0435 05/07/15 0443 05/08/15 0511 05/09/15 0458 05/10/15 0446  NA 133* 132* 136 138 139  K 4.0 4.4 4.7 4.6 4.7  CL 91* 91* 96* 98* 101  CO2 30 29 31 30 29   GLUCOSE 158* 182* 270* 206* 94  BUN 64* 59* 52* 41* 32*  CREATININE 5.58* 5.09* 4.26* 3.30* 2.61*  CALCIUM 7.2* 7.3*  7.6* 7.6* 8.7*   ------------------------------------------------------------------------------------------------------------------ estimated creatinine clearance is 49.5 mL/min (by C-G formula based on Cr of 2.61). ------------------------------------------------------------------------------------------------------------------ No results for input(s): HGBA1C in the last 72 hours. ------------------------------------------------------------------------------------------------------------------ No results for input(s): CHOL, HDL, LDLCALC, TRIG, CHOLHDL, LDLDIRECT in the last 72 hours. ------------------------------------------------------------------------------------------------------------------ No results for input(s): TSH, T4TOTAL, T3FREE, THYROIDAB in the last 72 hours.  Invalid input(s): FREET3 ------------------------------------------------------------------------------------------------------------------  Recent Labs  05/07/15 1355  TIBC 199*  IRON 29    Coagulation profile No results for input(s): INR, PROTIME in the last 168 hours.  No results for input(s): DDIMER in the last 72 hours.  Cardiac Enzymes No results for input(s): CKMB, TROPONINI, MYOGLOBIN in the last 168 hours.  Invalid input(s): CK ------------------------------------------------------------------------------------------------------------------ Invalid input(s): POCBNP   Recent Labs  05/09/15 0632 05/09/15 1122 05/09/15 1625 05/09/15 2128 05/10/15 0629 05/10/15 1215  GLUCAP 159* 167* 151* 104* 132* 161Zannie Cove M.D. [facility] 05/10/2015, 1:21 PM  Pager: [phone]  After 7pm go to  [email or web address] - password [email or web address]  Call night coverage person covering after 7pm

## 2015-05-10 NOTE — Progress Notes (Signed)
INFECTIOUS DISEASE PROGRESS NOTE  ID: Lindsay [name] is a 34 y.o. 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   Abtx:  Anti-infectives    Start     Dose/Rate Route Frequency Ordered Stop   05/10/15 2200  ceftaroline (TEFLARO) 600 mg in sodium chloride 0.9 % 250 mL IVPB     600 mg 250 mL/hr over 60 Minutes Intravenous Every 12 hours 05/10/15 1226     05/09/15 2200  ceftaroline (TEFLARO) 400 mg in sodium chloride 0.9 % 250 mL IVPB  Status:  Discontinued     400 mg 250 mL/hr over 60 Minutes Intravenous Every 12 hours 05/09/15 1145 05/10/15 1226   05/07/15 2200  ceftaroline (TEFLARO) 300 mg in sodium chloride 0.9 % 250 mL IVPB  Status:  Discontinued     300 mg 250 mL/hr over 60 Minutes Intravenous Every 12 hours 05/07/15 1552 05/09/15 1145   05/06/15 0900  fluconazole (DIFLUCAN) tablet 150 mg     150 mg Oral  Once 05/06/15 0840 05/06/15 1234   05/03/15 2200  piperacillin-tazobactam (ZOSYN) IVPB 2.25 g  Status:  Discontinued     2.25 g 100 mL/hr over 30 Minutes Intravenous 3 times per day 05/03/15 1514 05/07/15 1552   05/03/15 1000  linezolid (ZYVOX) IVPB 600 mg  Status:  Discontinued     600 mg 300 mL/hr over 60 Minutes Intravenous Every 12 hours 05/03/15 0752 05/06/15 1609   05/03/15 0800  DAPTOmycin (CUBICIN) 1,000 mg in sodium chloride 0.9 % IVPB  Status:  Discontinued     1,000 mg 240 mL/hr over 30 Minutes Intravenous Every 48 hours 05/02/15 1515 05/03/15 0751   05/01/15 0200  cefTRIAXone (ROCEPHIN) 2 g in dextrose 5 % 50 mL IVPB - Premix  Status:  Discontinued     2 g 100 mL/hr over 30 Minutes Intravenous Every 24 hours 04/30/15 0427  04/30/15 0808   04/30/15 1900  ceFAZolin (ANCEF) 3 g in dextrose 5 % 50 mL IVPB     3 g 160 mL/hr over 30 Minutes Intravenous  Once 04/30/15 1854 04/30/15 1946   04/30/15 1200  vancomycin (VANCOCIN) 1,500 mg in sodium chloride 0.9 % 500 mL IVPB  Status:  Discontinued     1,500 mg 250 mL/hr over 120 Minutes Intravenous Every 8 hours 04/30/15 0436 05/01/15 1350   04/30/15 0830  piperacillin-tazobactam (ZOSYN) IVPB 3.375 g  Status:  Discontinued     3.375 g 12.5 mL/hr over 240 Minutes Intravenous 3 times per day 04/30/15 0824 05/03/15 1514   04/30/15 0445  vancomycin (VANCOCIN) 1,500 mg in sodium chloride 0.9 % 500 mL IVPB     1,500 mg 250 mL/hr over 120 Minutes Intravenous  Once 04/30/15 0436 04/30/15 0707   04/30/15 0315  cefTRIAXone (ROCEPHIN) 2 g in dextrose 5 % 50 mL IVPB     2 g 100 mL/hr over 30 Minutes Intravenous  Once 04/30/15 0312 04/30/15 0433   04/30/15 0115  vancomycin (VANCOCIN) IVPB 1000 mg/200 mL premix     1,000 mg 200 mL/hr over 60 Minutes Intravenous  Once 04/30/15 0111 04/30/15 0302  Medications:  Scheduled: . sodium chloride   Intravenous Once  . bisacodyl  10 mg Rectal Once  . ceFTAROline (TEFLARO) IV  600 mg Intravenous Q12H  . docusate sodium  100 mg Oral BID  . ferrous sulfate  325 mg Oral BID WC  . gabapentin  200 mg Oral TID  . heparin subcutaneous  5,000 Units Subcutaneous 3 times per day  . insulin aspart  0-20 Units Subcutaneous TID WC  . insulin aspart  0-5 Units Subcutaneous QHS  . insulin aspart  10 Units Subcutaneous TID WC  . insulin glargine  70 Units Subcutaneous Daily  . nystatin   Topical TID  . polyethylene glycol  17 g Oral Daily  . pregabalin  50 mg Oral BID  . Vitamin D (Ergocalciferol)  50,000 Units Oral Q7 days    Objective: Vital signs in last 24 hours: Temp:  [98 F (36.7 C)-98.5 F (36.9 C)] 98.4 F (36.9 C) (05/20 0456) Pulse Rate:  [89-96] 89 (05/20 0456) Resp:  [16-18] 18 (05/20 0456) BP: (114-142)/(55-87) 135/87  mmHg (05/20 0456) SpO2:  [96 %-100 %] 100 % (05/20 0456)   General appearance: alert, cooperative and no distress Resp: clear to auscultation bilaterally Chest wall: dressing clean.  Cardio: regular rate and rhythm GI: normal findings: bowel sounds normal and soft, non-tender  Lab Results  Recent Labs  05/09/15 0458 05/10/15 0446  WBC 8.8 10.2  HGB 7.4* 8.8*  HCT 24.8* 29.4*  NA 138 139  K 4.6 4.7  CL 98* 101  CO2 30 29  BUN 41* 32*  CREATININE 3.30* 2.61*   Liver Panel  Recent Labs  05/09/15 0458 05/10/15 0446  ALBUMIN 1.6* 1.7*   Sedimentation Rate No results for input(s): ESRSEDRATE in the last 72 hours. C-Reactive Protein No results for input(s): CRP in the last 72 hours.  Microbiology: Recent Results (from the past 240 hour(s))  Surgical pcr screen     Status: Abnormal   Collection Time: 04/30/15  3:23 PM  Result Value Ref Range Status   MRSA, PCR NEGATIVE NEGATIVE Final   Staphylococcus aureus POSITIVE (A) NEGATIVE Final    Comment:        The Xpert SA Assay (FDA approved for NASAL specimens in patients over 59 years of age), is one component of a comprehensive surveillance program.  Test performance has been validated by [facility] for patients greater than or equal to 38 year old. It is not intended to diagnose infection nor to guide or monitor treatment.   Anaerobic culture     Status: None (Preliminary result)   Collection Time: 04/30/15  7:41 PM  Result Value Ref Range Status   Specimen Description WOUND RIGHT SHOULDER  Final   Special Requests RIGHT SHOULDER  Final   Gram Stain PENDING  Incomplete   Culture   Final    No Anaerobes Isolated; Culture in Progress for 14 DAYS Performed at [facility]    Report Status PENDING  Incomplete  Culture, routine-abscess     Status: None   Collection Time: 04/30/15  7:41 PM  Result Value Ref Range Status   Specimen Description ABSCESS RIGHT SHOULDER  Final   Special Requests PATIENT  ON FOLLOWING VANCOMYCIN ZOSYN  Final   Gram Stain   Final    NO WBC SEEN NO SQUAMOUS EPITHELIAL CELLS SEEN NO ORGANISMS SEEN Performed at [facility]    Culture   Final    NO GROWTH 3 DAYS Performed at [facility]    Report Status 05/04/2015 FINAL  Final  Anaerobic culture     Status: None   Collection Time: 04/30/15  7:47 PM  Result Value Ref Range Status   Specimen Description ABSCESS  DRAINAGE FROM RIGHT SHOULDER  Final   Special Requests PATIENT ON FOLLOWING VANCOMYCIN ZOSYN B  Final   Gram Stain   Final    NO WBC SEEN NO SQUAMOUS EPITHELIAL CELLS SEEN NO ORGANISMS SEEN Performed at [facility]    Culture   Final    NO ANAEROBES ISOLATED Performed at [facility]    Report Status 05/05/2015 FINAL  Final  Culture, routine-abscess     Status: None   Collection Time: 04/30/15  7:47 PM  Result Value Ref Range Status   Specimen Description ABSCESS RIGHT SHOULDER DRAINAGE  Final   Special Requests   Final    PATIENT ON FOLLOWING VANCOMYCIN ZOSYN SPECIMEN B 2 CUPS   Gram Stain   Final    NO WBC SEEN NO SQUAMOUS EPITHELIAL CELLS SEEN NO ORGANISMS SEEN Performed at [facility]    Culture   Final    NO GROWTH 3 DAYS Performed at [facility]    Report Status 05/04/2015 FINAL  Final  Anaerobic culture     Status: None (Preliminary result)   Collection Time: 04/30/15  7:48 PM  Result Value Ref Range Status   Specimen Description FLUID RIGHT SHOULDER JOINT  Final   Special Requests PATIENT ON FOLLOWING VANCOMYCIN ZOSYN C  Final   Gram Stain   Final    RARE WBC PRESENT, PREDOMINANTLY PMN NO ORGANISMS SEEN Performed at [facility]   Final    No Anaerobes Isolated; Culture in Progress for 14 DAYS Performed at [facility]    Report Status PENDING  Incomplete  Body fluid culture     Status: None   Collection Time: 04/30/15  7:48 PM  Result Value Ref Range Status   Specimen Description  FLUID RIGHT SHOULDER JOINT  Final   Special Requests PATIENT ON FOLLOWING VANC,ZOSYN  Final   Gram Stain   Final    RARE WBC PRESENT, PREDOMINANTLY PMN NO ORGANISMS SEEN Performed at [facility]    Culture   Final    NO GROWTH 3 DAYS Performed at [facility]    Report Status 05/04/2015 FINAL  Final    Studies/Results: No results found.   Assessment/Plan: Septic arthritis (R shoulder), chest wall abscess, myositis Debrided 5-10 Cx (-) DM2 (since 2004) Acute Renal Failure [place] Obesity (BMI 53.53) LE edema Protein- albumin malnutrition, severe  Total days of antibiotics: 11 (ceftaroline day 4)  Await more info from micro lab. Hopefully done 5-21.  Need to get up out of bed. ID available as needed over weekend.          Johny Sax Infectious Diseases (pager) [phone] [email or web address] 05/10/2015, 2:26 PM  LOS: 10 days

## 2015-05-10 NOTE — Progress Notes (Signed)
Per MD verbal order, packing taken out and dressing changed.  Patient tolerated very well using distraction.

## 2015-05-10 NOTE — Progress Notes (Signed)
Occupational Therapy Treatment Patient Details Name: Lindsay [name] MRN: [id number] DOB: 04-Mar-1981 Today's Date: 05/10/2015    History of present illness Pt is s/p I&D of the right shoulder with arthroscopy and I&D of the right chest wall abscess as well, on 04-30-2015. PMH includes: obesity, asthma, diabetes, hypertension, acute renal failure, septic arthritis.    OT comments  Making steady progress. Good participation with RUE A/AA/PROM. P/AAROM as follows: FF 90; Abd 60; ER 30. S with transfers this pm. Continue to encourage pt to complete RUE ROM throughout the day. Recommend pt only wear sling for positioning needs when OOB and ambulating. When in bed, RUE should be elevated on 3 pillows. Continue to follow acutely to address established goals.    Follow Up Recommendations  Home health OT;Supervision/Assistance - 24 hour    Equipment Recommendations  Tub/shower seat    Recommendations for Other Services      Precautions / Restrictions Precautions Precautions: Fall Precaution Comments: skin breakdown under breasts;groin;under pannus Required Braces or Orthoses: Sling Restrictions RUE Weight Bearing: Weight bearing as tolerated Other Position/Activity Restrictions: ROM as tolerated       Mobility        Transfers Overall transfer level: Needs assistance Equipment used: None Transfers: Sit to/from [employer] Transfers Sit to Stand: Supervision Stand pivot transfers: Supervision  increased time for all transfers     General transfer comment: using momentum    Balance    Good balance during pendulums                               ADL                                         General ADL Comments: Discussed clothing options and bathing. Pt will plan to wear "moo moos" which she can donn indepenently. working on [employer]. May need larger sling. disucssed using "pendulum " technique to wash under breasts adn change interdry.  discussed need for pt to ensure that she has assistance with ADL and dressing changes after D/C.                                      Cognition   Behavior During Therapy: WFL for tasks assessed/performed Overall Cognitive Status: Within Functional Limits for tasks assessed                       Extremity/Trunk Assessment   RUE improving ROM and strength. Moderate edema throughout RUE.            Exercises Shoulder Exercises Pendulum Exercise: PROM;AAROM;Right;Standing;20 reps Shoulder Flexion: AAROM;PROM;Right;20 reps;Standing (countertop and P/AAROM sitting in chair. Able to achieve 90 ) Shoulder ABduction: AAROM;PROM;20 reps;Seated;Standing (able to achieve 60) Shoulder External Rotation: PROM;AAROM;Right;20 reps;Seated;Standing (able to achieve 30) Elbow Flexion: AROM;AAROM;Right;20 reps;Standing Elbow Extension: AROM;AAROM;Right;20 reps;Standing Wrist Flexion: AROM;Right;20 reps Wrist Extension: AROM;Right;10 reps Digit Composite Flexion: Other (comment) (grip strengtheing ball)   Shoulder Instructions       General Comments  Pt cooperative and more cheerful this pm    Pertinent Vitals/ Pain       Pain Assessment: Faces Pain Score: 8  Faces Pain Scale: Hurts even more Pain Location: R shoulder during ROM Pain Descriptors /  Indicators: Grimacing;Guarding Pain Intervention(s): Limited activity within patient's tolerance;Monitored during session;Repositioned  Home Living                                          Prior Functioning/Environment              Frequency Min 4X/week     Progress Toward Goals  OT Goals(current goals can now be found in the care plan section)  Progress towards OT goals: Progressing toward goals  Acute Rehab OT Goals Patient Stated Goal: to go home OT Goal Formulation: With patient Time For Goal Achievement: 05/20/15 Potential to Achieve Goals: Good ADL Goals Pt Will Perform Upper Body  Bathing: with modified independence;with adaptive equipment;sitting Pt Will Perform Lower Body Bathing: with supervision;with adaptive equipment;sit to/from stand Pt Will Perform Upper Body Dressing: with adaptive equipment;sitting;with min guard assist Pt Will Perform Lower Body Dressing: with min assist;with adaptive equipment;sit to/from stand Pt Will Transfer to Toilet: with min guard assist;ambulating;regular height toilet;grab bars Pt Will Perform Tub/Shower Transfer: with min guard assist;ambulating;shower seat Pt/caregiver will Perform Home Exercise Program: Right Upper extremity;With written HEP provided  Plan Discharge plan remains appropriate;Frequency remains appropriate    Co-evaluation                 End of Session     Activity Tolerance Patient tolerated treatment well   Patient Left in chair;with call bell/phone within reach   Nurse Communication Mobility status        Time: 9629-5284 OT Time Calculation (min): 41 min  Charges: OT General Charges $OT Visit: 1 Procedure OT Treatments $Self Care/Home Management : 8-22 mins $Therapeutic Exercise: 23-37 mins  Jais Demir,HILLARY 05/10/2015, 6:20 PM   [facility], OTR/L  [phone] 05/10/2015

## 2015-05-10 NOTE — Progress Notes (Signed)
Subjective: Interval History: has no complaint, burning in feet better with ^ med, activity  Objective: Vital signs in last 24 hours: Temp:  [98 F (36.7 C)-98.5 F (36.9 C)] 98.4 F (36.9 C) (05/20 0456) Pulse Rate:  [89-96] 89 (05/20 0456) Resp:  [16-18] 18 (05/20 0456) BP: (114-142)/(55-87) 135/87 mmHg (05/20 0456) SpO2:  [96 %-100 %] 100 % (05/20 0456) Weight change:   Intake/Output from previous day: 05/19 0701 - 05/20 0700 In: 870 [P.O.:720; Blood:150] Out: 4000 [Urine:4000] Intake/Output this shift: Total I/O In: 240 [P.O.:240] Out: 1100 [Urine:1100]  General appearance: alert, morbidly obese and slowed mentation Resp: diminished breath sounds bilaterally Cardio: S1, S2 normal and systolic murmur: holosystolic 2/6, blowing at apex GI: obese,pos bs, massive Extremities: edema 3+, R shoulder brace  Lab Results:  Recent Labs  05/09/15 0458 05/10/15 0446  WBC 8.8 10.2  HGB 7.4* 8.8*  HCT 24.8* 29.4*  PLT 457* 510*   BMET:  Recent Labs  05/09/15 0458 05/10/15 0446  NA 138 139  K 4.6 4.7  CL 98* 101  CO2 30 29  GLUCOSE 206* 94  BUN 41* 32*  CREATININE 3.30* 2.61*  CALCIUM 7.6* 8.7*   No results for input(s): PTH in the last 72 hours. Iron Studies:  Recent Labs  05/07/15 1355  IRON 29  TIBC 199*    Studies/Results: No results found.  I have reviewed the patient's current medications.  Assessment/Plan: 1 AKI  Improving , cont to diurese, acid/base/K ok.  Still vol xs but diuresing 2 Neuropathy improving with Neurontin, activity 3 DM per primary 4 Obesity 5 shoulder infx P allow to diurese, will s/o for now    LOS: 10 days   Tanuj Mullens L 05/10/2015,9:48 AM

## 2015-05-10 NOTE — Progress Notes (Signed)
Foley catheter removed and patient instructed to get OOB to [facility] then sit into chair.  Mepilex applied to lateral left side of back d/t increased sweating while refusing to get OOB.

## 2015-05-11 LAB — ANAEROBIC CULTURE: Gram Stain: NONE SEEN

## 2015-05-11 LAB — RENAL FUNCTION PANEL
ANION GAP: 10 (ref 5–15)
Albumin: 1.8 g/dL — ABNORMAL LOW (ref 3.5–5.0)
BUN: 27 mg/dL — AB (ref 6–20)
CHLORIDE: 103 mmol/L (ref 101–111)
CO2: 27 mmol/L (ref 22–32)
Calcium: 8.7 mg/dL — ABNORMAL LOW (ref 8.9–10.3)
Creatinine, Ser: 2.2 mg/dL — ABNORMAL HIGH (ref 0.44–1.00)
GFR calc non Af Amer: 28 mL/min — ABNORMAL LOW (ref >60)
GFR, EST AFRICAN AMERICAN: 33 mL/min — AB (ref >60)
Glucose, Bld: 92 mg/dL (ref 65–99)
Phosphorus: 4.7 mg/dL — ABNORMAL HIGH (ref 2.5–4.6)
Potassium: 4.8 mmol/L (ref 3.5–5.1)
Sodium: 140 mmol/L (ref 135–145)

## 2015-05-11 LAB — CBC
HEMATOCRIT: 28.3 % — AB (ref 36.0–46.0)
Hemoglobin: 8.7 g/dL — ABNORMAL LOW (ref 12.0–15.0)
MCH: 25.4 pg — ABNORMAL LOW (ref 26.0–34.0)
MCHC: 30.7 g/dL (ref 30.0–36.0)
MCV: 82.5 fL (ref 78.0–100.0)
PLATELETS: 499 10*3/uL — AB (ref 150–400)
RBC: 3.43 MIL/uL — ABNORMAL LOW (ref 3.87–5.11)
RDW: 16.2 % — ABNORMAL HIGH (ref 11.5–15.5)
WBC: 9.6 10*3/uL (ref 4.0–10.5)

## 2015-05-11 LAB — GLUCOSE, CAPILLARY
GLUCOSE-CAPILLARY: 69 mg/dL (ref 65–99)
GLUCOSE-CAPILLARY: 119 mg/dL — AB (ref 65–99)
Glucose-Capillary: 81 mg/dL (ref 65–99)
Glucose-Capillary: 82 mg/dL (ref 65–99)

## 2015-05-11 LAB — CULTURE, ROUTINE-ABSCESS

## 2015-05-11 NOTE — Progress Notes (Addendum)
Occupational Therapy Treatment Patient Details Name: Lindsay [name] MRN: [id number] DOB: 1981-06-02 Today's Date: 05/11/2015    History of present illness Pt is s/p I&D of the right shoulder with arthroscopy and I&D of the right chest wall abscess as well, on 04-30-2015. PMH includes: obesity, asthma, diabetes, hypertension, acute renal failure, septic arthritis.    OT comments  Pt progressing. Continue to recommend HHOT upon d/c.   Follow Up Recommendations  Home health OT;Supervision/Assistance - 24 hour    Equipment Recommendations  Tub/shower seat    Recommendations for Other Services      Precautions / Restrictions Precautions Precautions: Fall Precaution Comments: skin breakdown under breasts;groin;under pannus Required Braces or Orthoses: Sling Restrictions Weight Bearing Restrictions: Yes RUE Weight Bearing: Weight bearing as tolerated Other Position/Activity Restrictions: ROM as tolerated       Mobility Bed Mobility               General bed mobility comments: not assessed  Transfers Overall transfer level: Needs assistance   Transfers: Sit to/from Stand Sit to Stand: Supervision         General transfer comment: using momentum    Balance  No LOB in session-balance not formally assessed.                                  ADL Overall ADL's : Needs assistance/impaired                         Toilet Transfer: Supervision/safety;Ambulation (chair)           Functional mobility during ADLs: Supervision/safety (used IV pole for ambulation) General ADL Comments: Pt moves very slow. OT assisted with sling. Explained importance/benefit of exercise/moving right arm.      Vision                     Perception     Praxis      Cognition  Awake/Alert Behavior During Therapy: WFL for tasks assessed/performed Overall Cognitive Status: Within Functional Limits for tasks assessed                        Extremity/Trunk Assessment               Exercises Other Exercises Other Exercises: Pt performed pendulums with Rt UE standing at counter Other Exercises: Pt performed table slides (FF and in circles) on counter with washcloth with Rt UE. Other Exercises: Pt performed approximately 10 reps each of right elbow flexion/extension (AAROM), wrist flexion/extension (AROM), and digit composite flexion/extension (AROM). Other Exercises: Pt performed approximately 5 reps each of right shoulder flexion sitting in chair (AAROM), shoulder ER (AAROM), and shoulder abduction (AAROM) Donning/doffing sling/immobilizer: Maximal assistance Correct positioning of sling/immobilizer: Moderate assistance ROM for elbow, wrist and digits of operated UE: (AAROM elbow ROM; able to perform wrist/hand actively)   Shoulder Instructions Shoulder Instructions Donning/doffing sling/immobilizer: Maximal assistance Correct positioning of sling/immobilizer: Moderate assistance ROM for elbow, wrist and digits of operated UE: Minimal assistance     General Comments      Pertinent Vitals/ Pain       Pain Assessment: 0-10 Pain Score:  (7-8) Pain Location: right arm  Pain Descriptors / Indicators: Aching Pain Intervention(s): Monitored during session;Repositioned;Limited activity within patient's tolerance  Home Living  Prior Functioning/Environment              Frequency Min 4X/week     Progress Toward Goals  OT Goals(current goals can now be found in the care plan section)  Progress towards OT goals: Progressing toward goals  Acute Rehab OT Goals Patient Stated Goal: not stated OT Goal Formulation: With patient Time For Goal Achievement: 05/20/15 Potential to Achieve Goals: Good ADL Goals Pt Will Perform Upper Body Bathing: with modified independence;with adaptive equipment;sitting Pt Will Perform Lower Body Bathing: with  supervision;with adaptive equipment;sit to/from stand Pt Will Perform Upper Body Dressing: with adaptive equipment;sitting;with min guard assist Pt Will Perform Lower Body Dressing: with min assist;with adaptive equipment;sit to/from stand Pt Will Transfer to Toilet: with min guard assist;ambulating;regular height toilet;grab bars Pt Will Perform Tub/Shower Transfer: with min guard assist;ambulating;shower seat Pt/caregiver will Perform Home Exercise Program: Right Upper extremity;With written HEP provided  Plan Discharge plan remains appropriate    Co-evaluation                 End of Session Equipment Utilized During Treatment: Other (comment) (sling)   Activity Tolerance Patient limited by pain   Patient Left in chair;with call bell/phone within reach   Nurse Communication Other (comment) (performed exercises)        Time: 1610-9604 OT Time Calculation (min): 34 min  Charges: OT General Charges $OT Visit: 1 Procedure OT Treatments $Therapeutic Exercise: 23-37 mins  Earlie Raveling OTR/L [phone] 05/11/2015, 2:25 PM

## 2015-05-11 NOTE — Progress Notes (Signed)
[facility]                                                                              Patient Demographics  Lindsay [name], is a 34 y.o. female, DOB - [birth year], YQM:[id number]  Admit date - 04/30/2015   Admitting Physician Houston Siren, MD  Outpatient Primary MD for the patient is Doris Cheadle, MD  LOS - 11   Chief Complaint  Patient presents with  . Shoulder Pain       Brief HPI   The patient is a 34 year old female with obesity, asthma, diabetes, hypertension presented to ED with right shoulder worsening pain. Patient was recently seen in the ED and had a small I&D done in ER and was placed on doxycycline. Patient however continued to have increased pain but no subjective fevers or chills. Evaluation in ED included a marked leukocytosis with WBC of 18 K and elevated blood sugar in 400s. Patient was started on IV vancomycin and hospitalist was asked to admit for cellulitis and hyperglycemia.   Assessment & Plan    Cellulitis/Abscess of right shoulder and chest wall abscess/myositis -postop day # 10 - MRI of the right shoulder which showed severe muscle edema with small 5 x 6x 25 mm fluid collection overall appearance concerning for myositis due to infectious or inflammatory etiology, large glenohumeral joint effusion, possible septic arthritis - underwent irrigation and debridement of the right shoulder with arthroscopy and patient was also found to have right chest wall subcutaneous abscess and Dr. Derrell Lolling was consulted intraoperatively. Patient underwent I&D of the right chest wall abscess as well, on 04-30-2015. - Creatinine function noticed to be trending up, vancomycin was discontinued. ID consult was obtained, patient seen by Dr. Ninetta Lights on 5/12, recommended to change to Zyvox and Zosyn. Zyvox stopped 5-16 due to concern for neuropathy.  -day 8 of IV antibiotics, now on Teflaro Day5, await FInal cultures, per Dr.Hatcher it was starting to grow something -PT  and OT consulted. needs HH.  -follow up ID recommendations regarding length of antibiotics, once creatinine continues to trend down could likely place PICC   Encephalopathy; -resolved -Suspect multifactorial; Sepsis, metabolic acidosis, renal failure.   -gabapentin dose decreased to 100 mg TID due to renal failure.   Anemia -due to iron defi and acute illness -due to Acute illness, AKI, and heavy mentrual cycles -transfused 1 unit PRBC 5/19, improved now, monitor -given IV iron 5/18 -stable now  Diabetes mellitus due to underlying condition :  - Blood sugars stable  - Continue lantus 70 units, SSI  Acute kidney injury: - suspect ATN due to sepsis, Vanc toxicity,. Baseline creatinine normal - Creatinine peaked at 5.8, improving now 2.2,  - still with high urine output - Vancomycin was discontinued and renal consulted, renal ultrasound showed no renal obstruction. - still auto diuresing  Right upper extremity swelling - doppler negative for DVT  Severe peripheral neuropathy- from DM -improving, continue lyrica, on gabapentin at reduced dose due to AKI  Obesity - Patient counseled on diet and weight control  HTN (hypertension) -Currently BP borderline stable, not on any antihypertensive  Hyponatremia: resolved, related to renal failure  and pseudohyponatremia from hyperglycemia.   Patient also had some bleeding from the site of heparin shots - no significant bleeding. Resumed heparin.   Skin, maceration, skin candidiasis;  Nystatin powder ordered.  Wound care nurse.    Code Status: Full code  Family Communication: Discussed in detail with the patient, all imaging results, lab results explained to the patient. Disposition Plan: home Monday if stable   Time Spent in minutes   25 minutes  Procedures  Irrigation and debridement of the right shoulder  I&D of the chest wall 5-10  Consults   Orthopedics, Dr. Eulah Pont  general surgery, Dr. Derrell Lolling infectious  disease Nephrology  DVT Prophylaxis SCDs . No significant bleeding, restarted heparin.   Medications  Scheduled Meds: . sodium chloride   Intravenous Once  . bisacodyl  10 mg Rectal Once  . ceFTAROline (TEFLARO) IV  600 mg Intravenous Q12H  . docusate sodium  100 mg Oral BID  . ferrous sulfate  325 mg Oral BID WC  . gabapentin  200 mg Oral TID  . heparin subcutaneous  5,000 Units Subcutaneous 3 times per day  . insulin aspart  0-20 Units Subcutaneous TID WC  . insulin aspart  0-5 Units Subcutaneous QHS  . insulin aspart  10 Units Subcutaneous TID WC  . insulin glargine  70 Units Subcutaneous Daily  . nystatin   Topical TID  . polyethylene glycol  17 g Oral Daily  . pregabalin  50 mg Oral BID  . Vitamin D (Ergocalciferol)  50,000 Units Oral Q7 days   Continuous Infusions:   PRN Meds:.acetaminophen, alum & mag hydroxide-simeth, HYDROmorphone (DILAUDID) injection, metoCLOPramide **OR** metoCLOPramide (REGLAN) injection, ondansetron **OR** ondansetron (ZOFRAN) IV, oxyCODONE-acetaminophen, sodium chloride   Antibiotics   Anti-infectives    Start     Dose/Rate Route Frequency Ordered Stop   05/10/15 2200  ceftaroline (TEFLARO) 600 mg in sodium chloride 0.9 % 250 mL IVPB     600 mg 250 mL/hr over 60 Minutes Intravenous Every 12 hours 05/10/15 1226     05/09/15 2200  ceftaroline (TEFLARO) 400 mg in sodium chloride 0.9 % 250 mL IVPB  Status:  Discontinued     400 mg 250 mL/hr over 60 Minutes Intravenous Every 12 hours 05/09/15 1145 05/10/15 1226   05/07/15 2200  ceftaroline (TEFLARO) 300 mg in sodium chloride 0.9 % 250 mL IVPB  Status:  Discontinued     300 mg 250 mL/hr over 60 Minutes Intravenous Every 12 hours 05/07/15 1552 05/09/15 1145   05/06/15 0900  fluconazole (DIFLUCAN) tablet 150 mg     150 mg Oral  Once 05/06/15 0840 05/06/15 1234   05/03/15 2200  piperacillin-tazobactam (ZOSYN) IVPB 2.25 g  Status:  Discontinued     2.25 g 100 mL/hr over 30 Minutes Intravenous 3 times  per day 05/03/15 1514 05/07/15 1552   05/03/15 1000  linezolid (ZYVOX) IVPB 600 mg  Status:  Discontinued     600 mg 300 mL/hr over 60 Minutes Intravenous Every 12 hours 05/03/15 0752 05/06/15 1609   05/03/15 0800  DAPTOmycin (CUBICIN) 1,000 mg in sodium chloride 0.9 % IVPB  Status:  Discontinued     1,000 mg 240 mL/hr over 30 Minutes Intravenous Every 48 hours 05/02/15 1515 05/03/15 0751   05/01/15 0200  cefTRIAXone (ROCEPHIN) 2 g in dextrose 5 % 50 mL IVPB - Premix  Status:  Discontinued     2 g 100 mL/hr over 30 Minutes Intravenous Every 24 hours 04/30/15 0427 04/30/15 0808   04/30/15  1900  ceFAZolin (ANCEF) 3 g in dextrose 5 % 50 mL IVPB     3 g 160 mL/hr over 30 Minutes Intravenous  Once 04/30/15 1854 04/30/15 1946   04/30/15 1200  vancomycin (VANCOCIN) 1,500 mg in sodium chloride 0.9 % 500 mL IVPB  Status:  Discontinued     1,500 mg 250 mL/hr over 120 Minutes Intravenous Every 8 hours 04/30/15 0436 05/01/15 1350   04/30/15 0830  piperacillin-tazobactam (ZOSYN) IVPB 3.375 g  Status:  Discontinued     3.375 g 12.5 mL/hr over 240 Minutes Intravenous 3 times per day 04/30/15 0824 05/03/15 1514   04/30/15 0445  vancomycin (VANCOCIN) 1,500 mg in sodium chloride 0.9 % 500 mL IVPB     1,500 mg 250 mL/hr over 120 Minutes Intravenous  Once 04/30/15 0436 04/30/15 0707   04/30/15 0315  cefTRIAXone (ROCEPHIN) 2 g in dextrose 5 % 50 mL IVPB     2 g 100 mL/hr over 30 Minutes Intravenous  Once 04/30/15 0312 04/30/15 0433   04/30/15 0115  vancomycin (VANCOCIN) IVPB 1000 mg/200 mL premix     1,000 mg 200 mL/hr over 60 Minutes Intravenous  Once 04/30/15 0111 04/30/15 0302        Subjective:   Joelle Sanville neuropathy pain decraesed, mobility increasing  Objective:   Blood pressure 143/66, pulse 96, temperature 99.3 F (37.4 C), temperature source Oral, resp. rate 18, height 5\' 8"  (1.727 m), weight 159.666 kg (352 lb), last menstrual period 04/23/2015, SpO2 100 %.  Wt Readings from Last  3 Encounters:  04/30/15 159.666 kg (352 lb)  04/03/15 156.945 kg (346 lb)  02/13/15 154.949 kg (341 lb 9.6 oz)     Intake/Output Summary (Last 24 hours) at 05/11/15 1104 Last data filed at 05/11/15 0626  Gross per 24 hour  Intake   1070 ml  Output      0 ml  Net   1070 ml    Exam  GeneralIs  AAOx3, morbidly obese, no distress  Neck: Supple, no JVD, no masses  CVS: S1 S2 /RRR  Respiratory Clear to auscultation bilaterally  Abdomen: Soft, nontender, nondistended, + bowel sounds, fungal infection, abdominal skin folds. Breast folds.   Ext:  dressing intact on the right chest and shoulder . LE with plus 1 edema.   Skin:  dressing intact on the right chest and shoulder    Data Review   Micro Results No results found for this or any previous visit (from the past 240 hour(s)).  Radiology Reports Dg Shoulder Right  04/19/2015   CLINICAL DATA:  Acute onset of right shoulder pain. Heard right shoulder pop 2 days ago. Initial encounter.  EXAM: RIGHT SHOULDER - 2+ VIEW  COMPARISON:  None.  FINDINGS: There is no evidence of fracture or dislocation. The right humeral head is seated within the glenoid fossa. The acromioclavicular joint is unremarkable in appearance. No significant soft tissue abnormalities are seen. The visualized portions of the right lung are clear.  IMPRESSION: No evidence of fracture or dislocation.   Electronically Signed   By: Roanna Raider M.D.   On: 04/19/2015 01:26   Ct Shoulder Right Wo Contrast  04/27/2015   CLINICAL DATA:  Constant right shoulder pain for 10 days  EXAM: CT OF THE RIGHT SHOULDER WITHOUT CONTRAST  TECHNIQUE: Multidetector CT imaging was performed according to the standard protocol. Multiplanar CT image reconstructions were also generated.  COMPARISON:  Radiographs 04/19/2015  FINDINGS: There is no fracture or dislocation. There is no bone  lesion. There is mild osteolysis of the distal clavicle. This can be seen after trauma or with inflammatory  arthropathies. It can be idiopathic. No soft tissue mass is evident. There is no abnormal fluid collection. Visible portions of the right ribs and chest wall appear unremarkable.  IMPRESSION: Osteolysis of the distal right clavicle, uncertain chronicity.   Electronically Signed   By: Ellery Plunk M.D.   On: 04/27/2015 03:43   US Renal  05/02/2015   CLINICAL DATA:  Acute renal failure  EXAM: RENAL / URINARY TRACT ULTRASOUND COMPLETE  COMPARISON:  None.  FINDINGS: Right Kidney:  Length: 13.4 cm. Echogenicity within normal limits. No mass or hydronephrosis visualized.  Left Kidney:  Length: 12.8 cm. Echogenicity within normal limits. No mass or hydronephrosis visualized.  Bladder:  Empty and therefore not well evaluated  IMPRESSION: Study limited by body habitus but appears normal.   Electronically Signed   By: Esperanza Heir M.D.   On: 05/02/2015 16:47   Mr Shoulder Right Wo Contrast  04/30/2015   CLINICAL DATA:  Right shoulder pain. Recent history of antibiotic use. Patient was in too much pain to continue with the contrasted portion of the exam.  EXAM: MRI OF THE RIGHT SHOULDER WITHOUT CONTRAST  TECHNIQUE: Multiplanar, multisequence MR imaging of the shoulder was performed. No intravenous contrast was administered.  COMPARISON:  None.  FINDINGS: Rotator cuff: Mild tendinosis of the supraspinatus and infraspinatus tendon. Teres minor tendon is intact. Subscapularis tendon is intact.  Muscles: There is severe muscle edema in the subscapularis muscle with small 5 x 6 x 25 mm fluid collection. There is severe muscle edema in the anterior and posterior deltoid muscles with a 14 mm fluid collection in the deltoid muscle. There is soft tissue edema superficial to the subscapularis and infraspinatus muscles. There is mild muscle edema along the periphery of the teres minor muscle.  Biceps long head:  Intact.  Acromioclavicular Joint: Mild degenerative change of the acromioclavicular joint. Type II acromion.   Glenohumeral Joint: Large joint effusion.  No chondral defect.  Labrum:  Intact.  Bones: No focal marrow signal abnormality. No fracture or dislocation.  IMPRESSION: 1. Severe muscle edema in the subscapularis muscle with small 5 x 6 x 25 mm fluid collection. There is severe muscle edema in the anterior and posterior deltoid muscles with a 14 mm fluid collection in the deltoid muscle. Soft tissue edema superficial to the subscapularis and infraspinatus muscles. Mild muscle edema along the periphery of the teres minor muscle. The overall appearance is most concerning for myositis which may be secondary to an infectious or inflammatory etiology. The small fluid collections make the process more concerning for an infectious etiology. 2. Large glenohumeral joint effusion. Septic arthritis cannot be excluded.   Electronically Signed   By: Elige Ko   On: 04/30/2015 10:45   Ir US Guide Vasc Access Left  05/04/2015   CLINICAL DATA:  Cellulitis  EXAM: TUNNELED LEFT INTERNAL JUGULAR PICC LINE PLACEMENT WITH ULTRASOUND AND FLUOROSCOPIC GUIDANCE  FLUOROSCOPY TIME:  12 seconds.  PROCEDURE: The patient was advised of the possible risks andcomplications and agreed to undergo the procedure. The patient was then brought to the angiographic suite for the procedure.  The left neck was prepped with chlorhexidine, drapedin the usual sterile fashion using maximum barrier technique (cap and mask, sterile gown, sterile gloves, large sterile sheet, hand hygiene and cutaneous antisepsis) and infiltrated locally with 1% Lidocaine.  Ultrasound demonstrated patency of the left jugular vein, and this  was documented with an image. Under real-time ultrasound guidance, this vein was accessed with a 21 gauge micropuncture needle and image documentation was performed. A long tract was utilized to create a tunnel. A 0.018 wire was introduced in to the vein. Over this, a 5 [place] single lumen Power tunneled PICC was advanced to the lower  SVC/right atrial junction. The cuff was positioned in the subcutaneous tract. Fluoroscopy during the procedure and fluoro spot radiograph confirms appropriate catheter position. The catheter was flushed and covered with asterile dressing.  Catheter length: 24 cm  COMPLICATIONS: None  IMPRESSION: Successful left jugular tunneled power PICC line placement with ultrasound and fluoroscopic guidance. The catheter is ready for use.   Electronically Signed   By: Jolaine Click M.D.   On: 05/04/2015 08:07   Ir Fluoro Guide Cv Midline Picc Left  05/04/2015   CLINICAL DATA:  Cellulitis  EXAM: TUNNELED LEFT INTERNAL JUGULAR PICC LINE PLACEMENT WITH ULTRASOUND AND FLUOROSCOPIC GUIDANCE  FLUOROSCOPY TIME:  12 seconds.  PROCEDURE: The patient was advised of the possible risks andcomplications and agreed to undergo the procedure. The patient was then brought to the angiographic suite for the procedure.  The left neck was prepped with chlorhexidine, drapedin the usual sterile fashion using maximum barrier technique (cap and mask, sterile gown, sterile gloves, large sterile sheet, hand hygiene and cutaneous antisepsis) and infiltrated locally with 1% Lidocaine.  Ultrasound demonstrated patency of the left jugular vein, and this was documented with an image. Under real-time ultrasound guidance, this vein was accessed with a 21 gauge micropuncture needle and image documentation was performed. A long tract was utilized to create a tunnel. A 0.018 wire was introduced in to the vein. Over this, a 5 [place] single lumen Power tunneled PICC was advanced to the lower SVC/right atrial junction. The cuff was positioned in the subcutaneous tract. Fluoroscopy during the procedure and fluoro spot radiograph confirms appropriate catheter position. The catheter was flushed and covered with asterile dressing.  Catheter length: 24 cm  COMPLICATIONS: None  IMPRESSION: Successful left jugular tunneled power PICC line placement with ultrasound and  fluoroscopic guidance. The catheter is ready for use.   Electronically Signed   By: Jolaine Click M.D.   On: 05/04/2015 08:07    CBC  Recent Labs Lab 05/07/15 0443 05/08/15 0511 05/09/15 0458 05/10/15 0446 05/11/15 0450  WBC 12.3* 11.0* 8.8 10.2 9.6  HGB 7.7* 7.6* 7.4* 8.8* 8.7*  HCT 24.7* 25.2* 24.8* 29.4* 28.3*  PLT 501* 536* 457* 510* 499*  MCV 77.2* 79.0 79.7 81.7 82.5  MCH 24.1* 23.8* 23.8* 24.4* 25.4*  MCHC 31.2 30.2 29.8* 29.9* 30.7  RDW 15.5 15.7* 16.0* 15.9* 16.2*    Chemistries   Recent Labs Lab 05/07/15 0443 05/08/15 0511 05/09/15 0458 05/10/15 0446 05/11/15 0450  NA 132* 136 138 139 140  K 4.4 4.7 4.6 4.7 4.8  CL 91* 96* 98* 101 103  CO2 29 31 30 29 27   GLUCOSE 182* 270* 206* 94 92  BUN 59* 52* 41* 32* 27*  CREATININE 5.09* 4.26* 3.30* 2.61* 2.20*  CALCIUM 7.3* 7.6* 7.6* 8.7* 8.7*   ------------------------------------------------------------------------------------------------------------------ estimated creatinine clearance is 58.7 mL/min (by C-G formula based on Cr of 2.2). ------------------------------------------------------------------------------------------------------------------ No results for input(s): HGBA1C in the last 72 hours. ------------------------------------------------------------------------------------------------------------------ No results for input(s): CHOL, HDL, LDLCALC, TRIG, CHOLHDL, LDLDIRECT in the last 72 hours. ------------------------------------------------------------------------------------------------------------------ No results for input(s): TSH, T4TOTAL, T3FREE, THYROIDAB in the last 72 hours.  Invalid input(s): FREET3 ------------------------------------------------------------------------------------------------------------------ No results for  input(s): VITAMINB12, FOLATE, FERRITIN, TIBC, IRON, RETICCTPCT in the last 72 hours.  Coagulation profile No results for input(s): INR, PROTIME in the last 168  hours.  No results for input(s): DDIMER in the last 72 hours.  Cardiac Enzymes No results for input(s): CKMB, TROPONINI, MYOGLOBIN in the last 168 hours.  Invalid input(s): CK ------------------------------------------------------------------------------------------------------------------ Invalid input(s): POCBNP   Recent Labs  05/09/15 2128 05/10/15 0629 05/10/15 1215 05/10/15 1619 05/10/15 2138 05/11/15 0625  GLUCAP 104* 132* 161* 167* 125* 81     Dujuan Stankowski M.D. [facility] 05/11/2015, 11:04 AM  Pager: [phone]  After 7pm go to [email or web address] - password [email or web address]  Call night coverage person covering after 7pm

## 2015-05-12 DIAGNOSIS — A480 Gas gangrene: Secondary | ICD-10-CM | POA: Insufficient documentation

## 2015-05-12 DIAGNOSIS — R748 Abnormal levels of other serum enzymes: Secondary | ICD-10-CM

## 2015-05-12 LAB — GLUCOSE, CAPILLARY
GLUCOSE-CAPILLARY: 136 mg/dL — AB (ref 65–99)
GLUCOSE-CAPILLARY: 110 mg/dL — AB (ref 65–99)
Glucose-Capillary: 73 mg/dL (ref 65–99)
Glucose-Capillary: 121 mg/dL — ABNORMAL HIGH (ref 65–99)

## 2015-05-12 LAB — RENAL FUNCTION PANEL
ANION GAP: 9 (ref 5–15)
Albumin: 1.9 g/dL — ABNORMAL LOW (ref 3.5–5.0)
BUN: 20 mg/dL (ref 6–20)
CHLORIDE: 106 mmol/L (ref 101–111)
CO2: 27 mmol/L (ref 22–32)
Calcium: 8.9 mg/dL (ref 8.9–10.3)
Creatinine, Ser: 1.93 mg/dL — ABNORMAL HIGH (ref 0.44–1.00)
GFR calc Af Amer: 38 mL/min — ABNORMAL LOW (ref >60)
GFR calc non Af Amer: 33 mL/min — ABNORMAL LOW (ref >60)
Glucose, Bld: 89 mg/dL (ref 65–99)
Phosphorus: 5.2 mg/dL — ABNORMAL HIGH (ref 2.5–4.6)
Potassium: 4.6 mmol/L (ref 3.5–5.1)
Sodium: 142 mmol/L (ref 135–145)

## 2015-05-12 MED ORDER — ENSURE PUDDING PO PUDG
1.0000 | Freq: Three times a day (TID) | ORAL | Status: DC
  Administered 2015-05-12 (×2): 1 via ORAL
  Filled 2015-05-12 (×8): qty 1

## 2015-05-12 MED ORDER — METRONIDAZOLE 500 MG PO TABS
500.0000 mg | ORAL_TABLET | Freq: Three times a day (TID) | ORAL | Status: DC
  Administered 2015-05-12 – 2015-05-13 (×4): 500 mg via ORAL
  Filled 2015-05-12 (×4): qty 1

## 2015-05-12 MED ORDER — METRONIDAZOLE 0.75 % VA GEL: 1.0000 | Freq: Every day | VAGINAL | Status: DC

## 2015-05-12 MED ORDER — CLOTRIMAZOLE 2 % VA CREA: 1.0000 | TOPICAL_CREAM | Freq: Every day | VAGINAL | Status: DC

## 2015-05-12 MED ORDER — CLOTRIMAZOLE 2 % VA CREA
1.0000 | TOPICAL_CREAM | Freq: Every day | VAGINAL | Status: DC
  Administered 2015-05-12 – 2015-05-13 (×2): 1 via VAGINAL
  Filled 2015-05-12: qty 21

## 2015-05-12 NOTE — Progress Notes (Signed)
[facility]          Subjective: No new complaints   Antibiotics:  Anti-infectives    Start     Dose/Rate Route Frequency Ordered Stop   05/12/15 1400  metroNIDAZOLE (FLAGYL) tablet 500 mg     500 mg Oral 3 times per day 05/12/15 1034     05/10/15 2200  ceftaroline (TEFLARO) 600 mg in sodium chloride 0.9 % 250 mL IVPB     600 mg 250 mL/hr over 60 Minutes Intravenous Every 12 hours 05/10/15 1226     05/09/15 2200  ceftaroline (TEFLARO) 400 mg in sodium chloride 0.9 % 250 mL IVPB  Status:  Discontinued     400 mg 250 mL/hr over 60 Minutes Intravenous Every 12 hours 05/09/15 1145 05/10/15 1226   05/07/15 2200  ceftaroline (TEFLARO) 300 mg in sodium chloride 0.9 % 250 mL IVPB  Status:  Discontinued     300 mg 250 mL/hr over 60 Minutes Intravenous Every 12 hours 05/07/15 1552 05/09/15 1145   05/06/15 0900  fluconazole (DIFLUCAN) tablet 150 mg     150 mg Oral  Once 05/06/15 0840 05/06/15 1234   05/03/15 2200  piperacillin-tazobactam (ZOSYN) IVPB 2.25 g  Status:  Discontinued     2.25 g 100 mL/hr over 30 Minutes Intravenous 3 times per day 05/03/15 1514 05/07/15 1552   05/03/15 1000  linezolid (ZYVOX) IVPB 600 mg  Status:  Discontinued     600 mg 300 mL/hr over 60 Minutes Intravenous Every 12 hours 05/03/15 0752 05/06/15 1609   05/03/15 0800  DAPTOmycin (CUBICIN) 1,000 mg in sodium chloride 0.9 % IVPB  Status:  Discontinued     1,000 mg 240 mL/hr over 30 Minutes Intravenous Every 48 hours 05/02/15 1515 05/03/15 0751   05/01/15 0200  cefTRIAXone (ROCEPHIN) 2 g in dextrose 5 % 50 mL IVPB - Premix  Status:  Discontinued     2 g 100 mL/hr over 30 Minutes Intravenous Every 24 hours 04/30/15 0427 04/30/15 0808   04/30/15 1900  ceFAZolin (ANCEF) 3 g in dextrose 5 % 50 mL IVPB     3 g 160 mL/hr over 30 Minutes Intravenous  Once 04/30/15 1854 04/30/15 1946   04/30/15 1200  vancomycin (VANCOCIN) 1,500 mg in sodium chloride 0.9 % 500 mL IVPB  Status:   Discontinued     1,500 mg 250 mL/hr over 120 Minutes Intravenous Every 8 hours 04/30/15 0436 05/01/15 1350   04/30/15 0830  piperacillin-tazobactam (ZOSYN) IVPB 3.375 g  Status:  Discontinued     3.375 g 12.5 mL/hr over 240 Minutes Intravenous 3 times per day 04/30/15 0824 05/03/15 1514   04/30/15 0445  vancomycin (VANCOCIN) 1,500 mg in sodium chloride 0.9 % 500 mL IVPB     1,500 mg 250 mL/hr over 120 Minutes Intravenous  Once 04/30/15 0436 04/30/15 0707   04/30/15 0315  cefTRIAXone (ROCEPHIN) 2 g in dextrose 5 % 50 mL IVPB     2 g 100 mL/hr over 30 Minutes Intravenous  Once 04/30/15 0312 04/30/15 0433   04/30/15 0115  vancomycin (VANCOCIN) IVPB 1000 mg/200 mL premix     1,000 mg 200 mL/hr over 60 Minutes Intravenous  Once 04/30/15 0111 04/30/15 0302      Medications: Scheduled Meds: . bisacodyl  10 mg Rectal Once  . ceFTAROline (TEFLARO) IV  600 mg Intravenous Q12H  . docusate sodium  100 mg Oral BID  . feeding supplement (ENSURE)  1 Container Oral TID  BM  . ferrous sulfate  325 mg Oral BID WC  . gabapentin  200 mg Oral TID  . heparin subcutaneous  5,000 Units Subcutaneous 3 times per day  . insulin aspart  0-20 Units Subcutaneous TID WC  . insulin aspart  0-5 Units Subcutaneous QHS  . insulin aspart  10 Units Subcutaneous TID WC  . insulin glargine  70 Units Subcutaneous Daily  . metroNIDAZOLE  500 mg Oral 3 times per day  . nystatin   Topical TID  . polyethylene glycol  17 g Oral Daily  . pregabalin  50 mg Oral BID  . Vitamin D (Ergocalciferol)  50,000 Units Oral Q7 days   Continuous Infusions:  PRN Meds:.acetaminophen, alum & mag hydroxide-simeth, HYDROmorphone (DILAUDID) injection, ondansetron **OR** ondansetron (ZOFRAN) IV, oxyCODONE-acetaminophen, sodium chloride    Objective: Weight change:   Intake/Output Summary (Last 24 hours) at 05/12/15 1629 Last data filed at 05/12/15 1300  Gross per 24 hour  Intake    770 ml  Output    350 ml  Net    420 ml   Blood  pressure 114/52, pulse 93, temperature 98.4 F (36.9 C), temperature source Oral, resp. rate 20, height  (1.727 m), weight 352 lb (159.666 kg), last menstrual period 04/23/2015, SpO2 98 %. Temp:  [98.4 F (36.9 C)-98.6 F (37 C)] 98.4 F (36.9 C) (05/22 1300) Pulse Rate:  [88-93] 93 (05/22 1300) Resp:  [18-20] 20 (05/22 1300) BP: (114-132)/(52-88) 114/52 mmHg (05/22 1300) SpO2:  [97 %-98 %] 98 % (05/22 1300)  Physical Exam: General: Alert and awake, oriented x3, not in any acute distress. HEENT: anicteric sclera,, EOMI CVS regular rate, normal  Chest: no wheezing, no respiratory distress Abdomen:nondistended  Right shoulder with dressing  Neuro: nonfocal  CBC: CBC Latest Ref Rng 05/11/2015 05/10/2015 05/09/2015  WBC 4.0 - 10.5 K/uL 9.6 10.2 8.8  Hemoglobin 12.0 - 15.0 g/dL 4.0(J) 8.1(X) 7.4(L)  Hematocrit 36.0 - 46.0 % 28.3(L) 29.4(L) 24.8(L)  Platelets 150 - 400 K/uL 499(H) 510(H) 457(H)       BMET  Recent Labs  05/11/15 0450 05/12/15 0530  NA 140 142  K 4.8 4.6  CL 103 106  CO2 27 27  GLUCOSE 92 89  BUN 27* 20  CREATININE 2.20* 1.93*  CALCIUM 8.7* 8.9     Liver Panel   Recent Labs  05/11/15 0450 05/12/15 0530  ALBUMIN 1.8* 1.9*       Sedimentation Rate No results for input(s): ESRSEDRATE in the last 72 hours. C-Reactive Protein No results for input(s): CRP in the last 72 hours.  Micro Results: Recent Results (from the past 720 hour(s))  Culture, routine-abscess     Status: None   Collection Time: 04/27/15  6:36 AM  Result Value Ref Range Status   Specimen Description ABSCESS RIGHT SHOULDER  Final   Special Requests NONE  Final   Gram Stain   Final    FEW WBC PRESENT,BOTH PMN AND MONONUCLEAR RARE GRAM NEGATIVE RODS Performed at [facility]    Culture   Final    MODERATE PREVOTELLA BIVIA Note: BETA LACTAMASE POSITIVE Performed at [facility]    Report Status 05/11/2015 FINAL  Final  Surgical pcr screen      Status: Abnormal   Collection Time: 04/30/15  3:23 PM  Result Value Ref Range Status   MRSA, PCR NEGATIVE NEGATIVE Final   Staphylococcus aureus POSITIVE (A) NEGATIVE Final    Comment:        The  Xpert SA Assay (FDA approved for NASAL specimens in patients over 75 years of age), is one component of a comprehensive surveillance program.  Test performance has been validated by [facility] for patients greater than or equal to 3 year old. It is not intended to diagnose infection nor to guide or monitor treatment.   Anaerobic culture     Status: None   Collection Time: 04/30/15  7:41 PM  Result Value Ref Range Status   Specimen Description WOUND RIGHT SHOULDER  Final   Special Requests RIGHT SHOULDER  Final   Gram Stain   Final    NO WBC SEEN NO SQUAMOUS EPITHELIAL CELLS SEEN NO ORGANISMS SEEN Performed at [facility]    Culture   Final    PREVOTELLA BIVIA Note: BETA LACTAMASE POSITIVE Performed at [facility]    Report Status 05/11/2015 FINAL  Final  Culture, routine-abscess     Status: None   Collection Time: 04/30/15  7:41 PM  Result Value Ref Range Status   Specimen Description ABSCESS RIGHT SHOULDER  Final   Special Requests PATIENT ON FOLLOWING VANCOMYCIN ZOSYN  Final   Gram Stain   Final    NO WBC SEEN NO SQUAMOUS EPITHELIAL CELLS SEEN NO ORGANISMS SEEN Performed at [facility]    Culture   Final    NO GROWTH 3 DAYS Performed at [facility]    Report Status 05/04/2015 FINAL  Final  Anaerobic culture     Status: None   Collection Time: 04/30/15  7:47 PM  Result Value Ref Range Status   Specimen Description ABSCESS  DRAINAGE FROM RIGHT SHOULDER  Final   Special Requests PATIENT ON FOLLOWING VANCOMYCIN ZOSYN B  Final   Gram Stain   Final    NO WBC SEEN NO SQUAMOUS EPITHELIAL CELLS SEEN NO ORGANISMS SEEN Performed at [facility]    Culture   Final    NO ANAEROBES ISOLATED Performed at [facility]      Report Status 05/05/2015 FINAL  Final  Culture, routine-abscess     Status: None   Collection Time: 04/30/15  7:47 PM  Result Value Ref Range Status   Specimen Description ABSCESS RIGHT SHOULDER DRAINAGE  Final   Special Requests   Final    PATIENT ON FOLLOWING VANCOMYCIN ZOSYN SPECIMEN B 2 CUPS   Gram Stain   Final    NO WBC SEEN NO SQUAMOUS EPITHELIAL CELLS SEEN NO ORGANISMS SEEN Performed at [facility]    Culture   Final    NO GROWTH 3 DAYS Performed at [facility]    Report Status 05/04/2015 FINAL  Final  Anaerobic culture     Status: None (Preliminary result)   Collection Time: 04/30/15  7:48 PM  Result Value Ref Range Status   Specimen Description FLUID RIGHT SHOULDER JOINT  Final   Special Requests PATIENT ON FOLLOWING VANCOMYCIN ZOSYN C  Final   Gram Stain   Final    RARE WBC PRESENT, PREDOMINANTLY PMN NO ORGANISMS SEEN Performed at [facility]   Final    No Anaerobes Isolated; Culture in Progress for 14 DAYS Performed at [facility]    Report Status PENDING  Incomplete  Body fluid culture     Status: None   Collection Time: 04/30/15  7:48 PM  Result Value Ref Range Status   Specimen Description FLUID RIGHT SHOULDER JOINT  Final   Special Requests PATIENT ON FOLLOWING  VANC,ZOSYN  Final   Gram Stain   Final    RARE WBC PRESENT, PREDOMINANTLY PMN NO ORGANISMS SEEN Performed at [facility]    Culture   Final    NO GROWTH 3 DAYS Performed at [facility]    Report Status 05/04/2015 FINAL  Final    Studies/Results: No results found.    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 female with  Right shoulder and right chest wall abscess sp I and D of asked wall and I&D arthroscopically of right shoulder by orthopedic surgery. Aerobic cultures have failed to yield an organism but anaerobic cultures now have shown a Prevotella bivia species that is beta-lactamase positive.ration is had a complicated course with elevated CPK on daptomycin and numbness while on Zyvox and Zosyn. She is most recently been on the Teflaro but now lacks anaerobic coverage for the Prevotella   #1 Shoulder and chest wall abscess: I'm adding metronidazole orally 500 mg 3 times a day to her Teflaro  She was receiving antecedent doxycyline prior to [facility] to the [facility] on [facility] 9 and was also getting systemic vancomycin and perhaps even Zosyn prior to her surgery. Therefore we certainly cannot exclude a polymicrobial infection of the chest wall and shoulder.  For now I'll leave her on IV teflaro to continue anti MRSA and anti Coag negative staph coverage, + streptococcal coverage with addition of po flagyl for anerobic coverage.  One could try to reason that this is more likely Strep + anerobes than MRSA or MSSA + anerobes and change for example to Rocephin + flagyl or IV invanz but I think I will for now be consistent with prior approach taken which was to also cover for MRSA which is reasonable given she was on doxy  Will defer final abx choice to Dr. Loleta Dicker has been on effective anerobic coverage for most of her stay absent 3 days from tthe 18th thru the 21st  I would make sure she gets 6 weeks of postoperative antibiotics which would put a stop date at June 21  She will need weekly cbc, BMP faxed to ID clinic at [phone]      LOS: 12 days   Acey Lav 05/12/2015, 4:29 PM

## 2015-05-12 NOTE — Progress Notes (Signed)
[facility]                                                                              Patient Demographics  Lindsay [name], is a 34 y.o. female, DOB - Oct 25, 1981, ZOX:[id number]  Admit date - 04/30/2015   Admitting Physician Houston Siren, MD  Outpatient Primary MD for the patient is Doris Cheadle, MD  LOS - 12   Chief Complaint  Patient presents with  . Shoulder Pain       Brief HPI   The patient is a 34 year old female with obesity, asthma, diabetes, hypertension presented to ED with right shoulder worsening pain. Patient was recently seen in the ED and had a small I&D done in ER and was placed on doxycycline. Patient however continued to have increased pain but no subjective fevers or chills. Evaluation in ED included a marked leukocytosis with WBC of 18 K and elevated blood sugar in 400s. Patient was started on IV vancomycin and hospitalist was asked to admit for cellulitis and hyperglycemia.   Assessment & Plan    Cellulitis/Abscess of right shoulder and chest wall abscess/myositis -postop day # 10 - MRI of the right shoulder which showed severe muscle edema with small 5 x 6x 25 mm fluid collection overall appearance concerning for myositis due to infectious or inflammatory etiology, large glenohumeral joint effusion, possible septic arthritis - underwent irrigation and debridement of the right shoulder with arthroscopy and patient was also found to have right chest wall subcutaneous abscess and Dr. Derrell Lolling was consulted intraoperatively. Patient underwent I&D of the right chest wall abscess as well, on 04-30-2015. - Creatinine function noticed to be trending up, vancomycin was discontinued. ID consult was obtained, patient seen by Dr. Ninetta Lights on 5/12, recommended to change to Zyvox and Zosyn. Zyvox stopped 5-16 due to concern for neuropathy.  -day12 of IV antibiotics, now on Teflaro Day 6, Wound Cx now with Prevotella , Dr.Van Dam to FU,  -follow up ID  recommendations regarding length of antibiotics, as creatinine continues to trend down should be able to place PICC -very close to DC  Encephalopathy; -resolved -Suspect multifactorial; Sepsis, metabolic acidosis, renal failure.   -gabapentin dose decreased to 100 mg TID due to renal failure.   Anemia -due to iron defi and acute illness -due to Acute illness, AKI, and heavy mentrual cycles -transfused 1 unit PRBC 5/19, improved now, monitor -given IV iron 5/18 -stable now  Diabetes mellitus due to underlying condition :  - Blood sugars stable  - Continue lantus 70 units, SSI  Acute kidney injury: - suspect ATN due to sepsis, Vanc toxicity,. Baseline creatinine normal - Creatinine peaked at 5.8, improving now 1.9,  - still with high urine output - Vancomycin was discontinued and renal consulted, renal ultrasound showed no renal obstruction. - still auto diuresing  Severe protein calorie malnutrition -albumin slowly improving, add ensure -RD consult  Right upper extremity swelling - doppler negative for DVT  Severe peripheral neuropathy- from DM -improving, continue lyrica, on gabapentin at reduced dose due to AKI  Obesity - Patient counseled on diet and weight control  HTN (hypertension) -Currently BP borderline stable, not  on any antihypertensive  Hyponatremia: resolved, related to renal failure and pseudohyponatremia from hyperglycemia.   Patient also had some bleeding from the site of heparin shots - no significant bleeding. Resumed heparin.   Skin, maceration, skin candidiasis;  Nystatin powder ordered.  Wound care nurse.    Code Status: Full code  Family Communication: Discussed in detail with the patient, all imaging results, lab results explained to the patient. Disposition Plan: home in 1-2days   Time Spent in minutes   25 minutes  Procedures  Irrigation and debridement of the right shoulder  I&D of the chest wall 5-10  Consults   Orthopedics, Dr.  Eulah Pont  general surgery, Dr. Derrell Lolling infectious disease Nephrology  DVT Prophylaxis SCDs . No significant bleeding, restarted heparin.   Medications  Scheduled Meds: . bisacodyl  10 mg Rectal Once  . ceFTAROline (TEFLARO) IV  600 mg Intravenous Q12H  . docusate sodium  100 mg Oral BID  . feeding supplement (ENSURE)  1 Container Oral TID BM  . ferrous sulfate  325 mg Oral BID WC  . gabapentin  200 mg Oral TID  . heparin subcutaneous  5,000 Units Subcutaneous 3 times per day  . insulin aspart  0-20 Units Subcutaneous TID WC  . insulin aspart  0-5 Units Subcutaneous QHS  . insulin aspart  10 Units Subcutaneous TID WC  . insulin glargine  70 Units Subcutaneous Daily  . metroNIDAZOLE  500 mg Oral 3 times per day  . nystatin   Topical TID  . polyethylene glycol  17 g Oral Daily  . pregabalin  50 mg Oral BID  . Vitamin D (Ergocalciferol)  50,000 Units Oral Q7 days   Continuous Infusions:   PRN Meds:.acetaminophen, alum & mag hydroxide-simeth, HYDROmorphone (DILAUDID) injection, ondansetron **OR** ondansetron (ZOFRAN) IV, oxyCODONE-acetaminophen, sodium chloride   Antibiotics   Anti-infectives    Start     Dose/Rate Route Frequency Ordered Stop   05/12/15 1400  metroNIDAZOLE (FLAGYL) tablet 500 mg     500 mg Oral 3 times per day 05/12/15 1034     05/10/15 2200  ceftaroline (TEFLARO) 600 mg in sodium chloride 0.9 % 250 mL IVPB     600 mg 250 mL/hr over 60 Minutes Intravenous Every 12 hours 05/10/15 1226     05/09/15 2200  ceftaroline (TEFLARO) 400 mg in sodium chloride 0.9 % 250 mL IVPB  Status:  Discontinued     400 mg 250 mL/hr over 60 Minutes Intravenous Every 12 hours 05/09/15 1145 05/10/15 1226   05/07/15 2200  ceftaroline (TEFLARO) 300 mg in sodium chloride 0.9 % 250 mL IVPB  Status:  Discontinued     300 mg 250 mL/hr over 60 Minutes Intravenous Every 12 hours 05/07/15 1552 05/09/15 1145   05/06/15 0900  fluconazole (DIFLUCAN) tablet 150 mg     150 mg Oral  Once 05/06/15  0840 05/06/15 1234   05/03/15 2200  piperacillin-tazobactam (ZOSYN) IVPB 2.25 g  Status:  Discontinued     2.25 g 100 mL/hr over 30 Minutes Intravenous 3 times per day 05/03/15 1514 05/07/15 1552   05/03/15 1000  linezolid (ZYVOX) IVPB 600 mg  Status:  Discontinued     600 mg 300 mL/hr over 60 Minutes Intravenous Every 12 hours 05/03/15 0752 05/06/15 1609   05/03/15 0800  DAPTOmycin (CUBICIN) 1,000 mg in sodium chloride 0.9 % IVPB  Status:  Discontinued     1,000 mg 240 mL/hr over 30 Minutes Intravenous Every 48 hours 05/02/15 1515 05/03/15 0751  05/01/15 0200  cefTRIAXone (ROCEPHIN) 2 g in dextrose 5 % 50 mL IVPB - Premix  Status:  Discontinued     2 g 100 mL/hr over 30 Minutes Intravenous Every 24 hours 04/30/15 0427 04/30/15 0808   04/30/15 1900  ceFAZolin (ANCEF) 3 g in dextrose 5 % 50 mL IVPB     3 g 160 mL/hr over 30 Minutes Intravenous  Once 04/30/15 1854 04/30/15 1946   04/30/15 1200  vancomycin (VANCOCIN) 1,500 mg in sodium chloride 0.9 % 500 mL IVPB  Status:  Discontinued     1,500 mg 250 mL/hr over 120 Minutes Intravenous Every 8 hours 04/30/15 0436 05/01/15 1350   04/30/15 0830  piperacillin-tazobactam (ZOSYN) IVPB 3.375 g  Status:  Discontinued     3.375 g 12.5 mL/hr over 240 Minutes Intravenous 3 times per day 04/30/15 0824 05/03/15 1514   04/30/15 0445  vancomycin (VANCOCIN) 1,500 mg in sodium chloride 0.9 % 500 mL IVPB     1,500 mg 250 mL/hr over 120 Minutes Intravenous  Once 04/30/15 0436 04/30/15 0707   04/30/15 0315  cefTRIAXone (ROCEPHIN) 2 g in dextrose 5 % 50 mL IVPB     2 g 100 mL/hr over 30 Minutes Intravenous  Once 04/30/15 0312 04/30/15 0433   04/30/15 0115  vancomycin (VANCOCIN) IVPB 1000 mg/200 mL premix     1,000 mg 200 mL/hr over 60 Minutes Intravenous  Once 04/30/15 0111 04/30/15 0302        Subjective:   Keiyana Porada moving better, neuropathy pain decreased with lyrica  Objective:   Blood pressure 132/68, pulse 88, temperature 98.5 F  (36.9 C), temperature source Oral, resp. rate 18, height 5\' 8"  (1.727 m), weight 159.666 kg (352 lb), last menstrual period 04/23/2015, SpO2 98 %.  Wt Readings from Last 3 Encounters:  04/30/15 159.666 kg (352 lb)  04/03/15 156.945 kg (346 lb)  02/13/15 154.949 kg (341 lb 9.6 oz)     Intake/Output Summary (Last 24 hours) at 05/12/15 1148 Last data filed at 05/12/15 0533  Gross per 24 hour  Intake    770 ml  Output   1351 ml  Net   -581 ml    Exam  GeneralIs  AAOx3, morbidly obese, no distress  Neck: Supple, no JVD, no masses  CVS: S1 S2 /RRR  Respiratory Clear to auscultation bilaterally  Abdomen: Soft, nontender, nondistended, + bowel sounds, fungal infection, abdominal skin folds. Breast folds.   Ext:  dressing intact on the right chest and shoulder . LE with plus 1 edema.   Skin:  dressing intact on the right chest and shoulder    Data Review   Micro Results No results found for this or any previous visit (from the past 240 hour(s)).  Radiology Reports Dg Shoulder Right  04/19/2015   CLINICAL DATA:  Acute onset of right shoulder pain. Heard right shoulder pop 2 days ago. Initial encounter.  EXAM: RIGHT SHOULDER - 2+ VIEW  COMPARISON:  None.  FINDINGS: There is no evidence of fracture or dislocation. The right humeral head is seated within the glenoid fossa. The acromioclavicular joint is unremarkable in appearance. No significant soft tissue abnormalities are seen. The visualized portions of the right lung are clear.  IMPRESSION: No evidence of fracture or dislocation.   Electronically Signed   By: Roanna Raider M.D.   On: 04/19/2015 01:26   Ct Shoulder Right Wo Contrast  04/27/2015   CLINICAL DATA:  Constant right shoulder pain for 10 days  EXAM: CT  OF THE RIGHT SHOULDER WITHOUT CONTRAST  TECHNIQUE: Multidetector CT imaging was performed according to the standard protocol. Multiplanar CT image reconstructions were also generated.  COMPARISON:  Radiographs 04/19/2015   FINDINGS: There is no fracture or dislocation. There is no bone lesion. There is mild osteolysis of the distal clavicle. This can be seen after trauma or with inflammatory arthropathies. It can be idiopathic. No soft tissue mass is evident. There is no abnormal fluid collection. Visible portions of the right ribs and chest wall appear unremarkable.  IMPRESSION: Osteolysis of the distal right clavicle, uncertain chronicity.   Electronically Signed   By: Ellery Plunk M.D.   On: 04/27/2015 03:43   US Renal  05/02/2015   CLINICAL DATA:  Acute renal failure  EXAM: RENAL / URINARY TRACT ULTRASOUND COMPLETE  COMPARISON:  None.  FINDINGS: Right Kidney:  Length: 13.4 cm. Echogenicity within normal limits. No mass or hydronephrosis visualized.  Left Kidney:  Length: 12.8 cm. Echogenicity within normal limits. No mass or hydronephrosis visualized.  Bladder:  Empty and therefore not well evaluated  IMPRESSION: Study limited by body habitus but appears normal.   Electronically Signed   By: Esperanza Heir M.D.   On: 05/02/2015 16:47   Mr Shoulder Right Wo Contrast  04/30/2015   CLINICAL DATA:  Right shoulder pain. Recent history of antibiotic use. Patient was in too much pain to continue with the contrasted portion of the exam.  EXAM: MRI OF THE RIGHT SHOULDER WITHOUT CONTRAST  TECHNIQUE: Multiplanar, multisequence MR imaging of the shoulder was performed. No intravenous contrast was administered.  COMPARISON:  None.  FINDINGS: Rotator cuff: Mild tendinosis of the supraspinatus and infraspinatus tendon. Teres minor tendon is intact. Subscapularis tendon is intact.  Muscles: There is severe muscle edema in the subscapularis muscle with small 5 x 6 x 25 mm fluid collection. There is severe muscle edema in the anterior and posterior deltoid muscles with a 14 mm fluid collection in the deltoid muscle. There is soft tissue edema superficial to the subscapularis and infraspinatus muscles. There is mild muscle edema along  the periphery of the teres minor muscle.  Biceps long head:  Intact.  Acromioclavicular Joint: Mild degenerative change of the acromioclavicular joint. Type II acromion.  Glenohumeral Joint: Large joint effusion.  No chondral defect.  Labrum:  Intact.  Bones: No focal marrow signal abnormality. No fracture or dislocation.  IMPRESSION: 1. Severe muscle edema in the subscapularis muscle with small 5 x 6 x 25 mm fluid collection. There is severe muscle edema in the anterior and posterior deltoid muscles with a 14 mm fluid collection in the deltoid muscle. Soft tissue edema superficial to the subscapularis and infraspinatus muscles. Mild muscle edema along the periphery of the teres minor muscle. The overall appearance is most concerning for myositis which may be secondary to an infectious or inflammatory etiology. The small fluid collections make the process more concerning for an infectious etiology. 2. Large glenohumeral joint effusion. Septic arthritis cannot be excluded.   Electronically Signed   By: Elige Ko   On: 04/30/2015 10:45   Ir US Guide Vasc Access Left  05/04/2015   CLINICAL DATA:  Cellulitis  EXAM: TUNNELED LEFT INTERNAL JUGULAR PICC LINE PLACEMENT WITH ULTRASOUND AND FLUOROSCOPIC GUIDANCE  FLUOROSCOPY TIME:  12 seconds.  PROCEDURE: The patient was advised of the possible risks andcomplications and agreed to undergo the procedure. The patient was then brought to the angiographic suite for the procedure.  The left neck was prepped with  chlorhexidine, drapedin the usual sterile fashion using maximum barrier technique (cap and mask, sterile gown, sterile gloves, large sterile sheet, hand hygiene and cutaneous antisepsis) and infiltrated locally with 1% Lidocaine.  Ultrasound demonstrated patency of the left jugular vein, and this was documented with an image. Under real-time ultrasound guidance, this vein was accessed with a 21 gauge micropuncture needle and image documentation was performed. A long  tract was utilized to create a tunnel. A 0.018 wire was introduced in to the vein. Over this, a 5 [place] single lumen Power tunneled PICC was advanced to the lower SVC/right atrial junction. The cuff was positioned in the subcutaneous tract. Fluoroscopy during the procedure and fluoro spot radiograph confirms appropriate catheter position. The catheter was flushed and covered with asterile dressing.  Catheter length: 24 cm  COMPLICATIONS: None  IMPRESSION: Successful left jugular tunneled power PICC line placement with ultrasound and fluoroscopic guidance. The catheter is ready for use.   Electronically Signed   By: Jolaine Click M.D.   On: 05/04/2015 08:07   Ir Fluoro Guide Cv Midline Picc Left  05/04/2015   CLINICAL DATA:  Cellulitis  EXAM: TUNNELED LEFT INTERNAL JUGULAR PICC LINE PLACEMENT WITH ULTRASOUND AND FLUOROSCOPIC GUIDANCE  FLUOROSCOPY TIME:  12 seconds.  PROCEDURE: The patient was advised of the possible risks andcomplications and agreed to undergo the procedure. The patient was then brought to the angiographic suite for the procedure.  The left neck was prepped with chlorhexidine, drapedin the usual sterile fashion using maximum barrier technique (cap and mask, sterile gown, sterile gloves, large sterile sheet, hand hygiene and cutaneous antisepsis) and infiltrated locally with 1% Lidocaine.  Ultrasound demonstrated patency of the left jugular vein, and this was documented with an image. Under real-time ultrasound guidance, this vein was accessed with a 21 gauge micropuncture needle and image documentation was performed. A long tract was utilized to create a tunnel. A 0.018 wire was introduced in to the vein. Over this, a 5 [place] single lumen Power tunneled PICC was advanced to the lower SVC/right atrial junction. The cuff was positioned in the subcutaneous tract. Fluoroscopy during the procedure and fluoro spot radiograph confirms appropriate catheter position. The catheter was flushed and covered  with asterile dressing.  Catheter length: 24 cm  COMPLICATIONS: None  IMPRESSION: Successful left jugular tunneled power PICC line placement with ultrasound and fluoroscopic guidance. The catheter is ready for use.   Electronically Signed   By: Jolaine Click M.D.   On: 05/04/2015 08:07    CBC  Recent Labs Lab 05/07/15 0443 05/08/15 0511 05/09/15 0458 05/10/15 0446 05/11/15 0450  WBC 12.3* 11.0* 8.8 10.2 9.6  HGB 7.7* 7.6* 7.4* 8.8* 8.7*  HCT 24.7* 25.2* 24.8* 29.4* 28.3*  PLT 501* 536* 457* 510* 499*  MCV 77.2* 79.0 79.7 81.7 82.5  MCH 24.1* 23.8* 23.8* 24.4* 25.4*  MCHC 31.2 30.2 29.8* 29.9* 30.7  RDW 15.5 15.7* 16.0* 15.9* 16.2*    Chemistries   Recent Labs Lab 05/08/15 0511 05/09/15 0458 05/10/15 0446 05/11/15 0450 05/12/15 0530  NA 136 138 139 140 142  K 4.7 4.6 4.7 4.8 4.6  CL 96* 98* 101 103 106  CO2 31 30 29 27 27   GLUCOSE 270* 206* 94 92 89  BUN 52* 41* 32* 27* 20  CREATININE 4.26* 3.30* 2.61* 2.20* 1.93*  CALCIUM 7.6* 7.6* 8.7* 8.7* 8.9   ------------------------------------------------------------------------------------------------------------------ estimated creatinine clearance is 66.9 mL/min (by C-G formula based on Cr of 1.93). ------------------------------------------------------------------------------------------------------------------ No results for input(s): HGBA1C in  the last 72 hours. ------------------------------------------------------------------------------------------------------------------ No results for input(s): CHOL, HDL, LDLCALC, TRIG, CHOLHDL, LDLDIRECT in the last 72 hours. ------------------------------------------------------------------------------------------------------------------ No results for input(s): TSH, T4TOTAL, T3FREE, THYROIDAB in the last 72 hours.  Invalid input(s): FREET3 ------------------------------------------------------------------------------------------------------------------ No results for input(s):  VITAMINB12, FOLATE, FERRITIN, TIBC, IRON, RETICCTPCT in the last 72 hours.  Coagulation profile No results for input(s): INR, PROTIME in the last 168 hours.  No results for input(s): DDIMER in the last 72 hours.  Cardiac Enzymes No results for input(s): CKMB, TROPONINI, MYOGLOBIN in the last 168 hours.  Invalid input(s): CK ------------------------------------------------------------------------------------------------------------------ Invalid input(s): POCBNP   Recent Labs  05/10/15 2138 05/11/15 0625 05/11/15 1218 05/11/15 1621 05/11/15 2135 05/12/15 0630  GLUCAP 125* 81 82 69 119* 73     Hasel Janish M.D. [facility] 05/12/2015, 11:48 AM  Pager: [phone]  After 7pm go to [email or web address] - password [email or web address]  Call night coverage person covering after 7pm

## 2015-05-12 NOTE — Progress Notes (Signed)
Morning blood sugar was 73. Pt asymptomatic. Drinking juice and breakfast will be here soon.

## 2015-05-12 NOTE — Progress Notes (Addendum)
      [facility]     Patient is growing prevotella which is not covered by her curreent antibiotics  I will add oral metronidazole for now TID  Another option for once daily dosing would be Pincus Sanes though we would lose Coag Neg Staph and MRSA coverage (though not clear to me that absolutely need it we have been clearly  Trying to cover that organism)   I will discuss wit the patient today.

## 2015-05-13 LAB — CBC
HEMATOCRIT: 26.5 % — AB (ref 36.0–46.0)
Hemoglobin: 7.8 g/dL — ABNORMAL LOW (ref 12.0–15.0)
MCH: 24.5 pg — AB (ref 26.0–34.0)
MCHC: 29.4 g/dL — ABNORMAL LOW (ref 30.0–36.0)
MCV: 83.1 fL (ref 78.0–100.0)
Platelets: 396 10*3/uL (ref 150–400)
RBC: 3.19 MIL/uL — AB (ref 3.87–5.11)
RDW: 16.9 % — ABNORMAL HIGH (ref 11.5–15.5)
WBC: 7.5 10*3/uL (ref 4.0–10.5)

## 2015-05-13 LAB — GLUCOSE, CAPILLARY
GLUCOSE-CAPILLARY: 81 mg/dL (ref 65–99)
GLUCOSE-CAPILLARY: 127 mg/dL — AB (ref 65–99)
Glucose-Capillary: 114 mg/dL — ABNORMAL HIGH (ref 65–99)
Glucose-Capillary: 155 mg/dL — ABNORMAL HIGH (ref 65–99)

## 2015-05-13 LAB — RENAL FUNCTION PANEL
Albumin: 1.8 g/dL — ABNORMAL LOW (ref 3.5–5.0)
Anion gap: 7 (ref 5–15)
BUN: 17 mg/dL (ref 6–20)
CALCIUM: 8.7 mg/dL — AB (ref 8.9–10.3)
CO2: 27 mmol/L (ref 22–32)
Chloride: 108 mmol/L (ref 101–111)
Creatinine, Ser: 1.89 mg/dL — ABNORMAL HIGH (ref 0.44–1.00)
GFR calc non Af Amer: 34 mL/min — ABNORMAL LOW (ref >60)
GFR, EST AFRICAN AMERICAN: 39 mL/min — AB (ref >60)
Glucose, Bld: 94 mg/dL (ref 65–99)
PHOSPHORUS: 5.3 mg/dL — AB (ref 2.5–4.6)
POTASSIUM: 4.8 mmol/L (ref 3.5–5.1)
Sodium: 142 mmol/L (ref 135–145)

## 2015-05-13 MED ORDER — ENSURE ENLIVE PO LIQD
237.0000 mL | ORAL | Status: DC
  Administered 2015-05-13: 237 mL via ORAL

## 2015-05-13 MED ORDER — SODIUM CHLORIDE 0.9 % IV SOLN: 1.0000 g | INTRAVENOUS | Status: DC

## 2015-05-13 MED ORDER — GABAPENTIN 100 MG PO CAPS: 100.0000 mg | ORAL_CAPSULE | Freq: Three times a day (TID) | ORAL | Status: DC

## 2015-05-13 MED ORDER — POLYETHYLENE GLYCOL 3350 17 G PO PACK: 17.0000 g | PACK | Freq: Every day | ORAL | Status: DC

## 2015-05-13 MED ORDER — SODIUM CHLORIDE 0.9 % IV SOLN
1.0000 g | INTRAVENOUS | Status: DC
  Administered 2015-05-13 – 2015-05-14 (×2): 1 g via INTRAVENOUS
  Filled 2015-05-13 (×4): qty 1

## 2015-05-13 MED ORDER — INSULIN ASPART 100 UNIT/ML FLEXPEN: 10.0000 [IU] | PEN_INJECTOR | Freq: Three times a day (TID) | SUBCUTANEOUS | Status: DC

## 2015-05-13 MED ORDER — PRO-STAT SUGAR FREE PO LIQD
30.0000 mL | Freq: Two times a day (BID) | ORAL | Status: DC
  Filled 2015-05-13 (×5): qty 30

## 2015-05-13 MED ORDER — PREGABALIN 50 MG PO CAPS: 50.0000 mg | ORAL_CAPSULE | Freq: Two times a day (BID) | ORAL | Status: DC

## 2015-05-13 MED ORDER — FERROUS SULFATE 325 (65 FE) MG PO TABS: 325.0000 mg | ORAL_TABLET | Freq: Two times a day (BID) | ORAL | Status: DC

## 2015-05-13 MED ORDER — ADULT MULTIVITAMIN W/MINERALS CH
1.0000 | ORAL_TABLET | Freq: Every day | ORAL | Status: DC
  Administered 2015-05-13 – 2015-05-14 (×2): 1 via ORAL
  Filled 2015-05-13 (×2): qty 1

## 2015-05-13 NOTE — Progress Notes (Signed)
PT Cancellation Note  Patient Details Name: Lindsay [name] MRN: [id number] DOB: 1980-12-24   Cancelled Treatment:    Reason Eval/Treat Not Completed: Fatigue/lethargy limiting ability to participate. Patient adamantly refusing PT session stating she had just return to bed after working with OT earlier. Provided encouragement on progressing with therapy however patient continued to refuse.    Fredrich Birks 05/13/2015, 10:54 AM

## 2015-05-13 NOTE — Progress Notes (Addendum)
[facility]                                                                              Patient Demographics  Lindsay [name], is a 34 y.o. female, DOB - Sep 13, 1981, OZH:[id number]  Admit date - 04/30/2015   Admitting Physician Houston Siren, MD  Outpatient Primary MD for the patient is Doris Cheadle, MD  LOS - 13   Chief Complaint  Patient presents with  . Shoulder Pain       Brief HPI   The patient is a 34 year old female with obesity, asthma, diabetes, hypertension presented to ED with right shoulder worsening pain. Patient was recently seen in the ED and had a small I&D done in ER and was placed on doxycycline. Patient however continued to have increased pain but no subjective fevers or chills. Evaluation in ED included a marked leukocytosis with WBC of 18 K and elevated blood sugar in 400s. Patient was started on IV vancomycin and hospitalist was asked to admit for cellulitis and hyperglycemia.   Assessment & Plan    Cellulitis/Abscess of right shoulder and chest wall abscess/myositis -postop day # 13 - MRI of the right shoulder which showed severe muscle edema with small 5 x 6x 25 mm fluid collection overall appearance concerning for myositis due to infectious or inflammatory etiology, large glenohumeral joint effusion, possible septic arthritis - underwent irrigation and debridement of the right shoulder with arthroscopy and patient was also found to have right chest wall subcutaneous abscess and Dr. Derrell Lolling was consulted intraoperatively. Patient underwent I&D of the right chest wall abscess as well, on 04-30-2015. - Creatinine function noticed to be trending up, vancomycin was discontinued. ID consult was obtained, patient seen by Dr. Ninetta Lights on 5/12, recommended to change to Zyvox and Zosyn. Zyvox stopped 5-16 due to concern for neuropathy.  -day13 of IV antibiotics, now on Teflaro Day 7, Wound Cx now with Prevotella , flagyl added,  -Has Left IJ Tunneled PICC  placed by IR Dr.Hoss 5/13, can go home with this but will need to be removed in Radiology after IV therapy completed -Await ID recs for discharge  Encephalopathy; -resolved -Suspect multifactorial; Sepsis, metabolic acidosis, renal failure.   -gabapentin dose decreased to 100 mg TID due to renal failure.   Anemia -due to iron defi and acute illness -due to Acute illness, AKI, and heavy mentrual cycles -transfused 1 unit PRBC 5/19, improved now, monitor -given IV iron 5/18 -stable now  Diabetes mellitus due to underlying condition :  - Blood sugars stable  - Continue lantus 70 units, SSI  Acute kidney injury: - suspect ATN due to sepsis, Vanc toxicity,. Baseline creatinine normal - Creatinine peaked at 5.8, improving now 1.89,  - good urine output - Vancomycin was discontinued and renal consulted, renal ultrasound showed no renal obstruction. - still auto diuresing  Severe protein calorie malnutrition -albumin slowly improving, add ensure -RD consulted  Right upper extremity swelling - doppler negative for DVT  Severe peripheral neuropathy- from DM -improving, continue lyrica, on gabapentin at reduced dose due to AKI  Obesity - Patient counseled on diet and weight control  HTN (hypertension) -Currently  BP borderline stable, not on any antihypertensive  Hyponatremia: resolved, related to renal failure and pseudohyponatremia from hyperglycemia.   Patient also had some bleeding from the site of heparin shots - no significant bleeding. Resumed heparin.   Skin, maceration, skin candidiasis;  Nystatin powder ordered.  Wound care nurse.    Code Status: Full code  Family Communication: Discussed in detail with the patient, all imaging results, lab results explained to the patient. Disposition Plan: home Later today or tomorrow  Time Spent in minutes   25 minutes  Procedures  Irrigation and debridement of the right shoulder  I&D of the chest wall 5-10  Consults     Orthopedics, Dr. Eulah Pont  general surgery, Dr. Derrell Lolling infectious disease Nephrology  DVT Prophylaxis SCDs . No significant bleeding, restarted heparin.   Medications  Scheduled Meds: . bisacodyl  10 mg Rectal Once  . ceFTAROline (TEFLARO) IV  600 mg Intravenous Q12H  . clotrimazole  1 Applicatorful Vaginal QHS  . docusate sodium  100 mg Oral BID  . feeding supplement (ENSURE)  1 Container Oral TID BM  . ferrous sulfate  325 mg Oral BID WC  . gabapentin  200 mg Oral TID  . heparin subcutaneous  5,000 Units Subcutaneous 3 times per day  . insulin aspart  0-20 Units Subcutaneous TID WC  . insulin aspart  0-5 Units Subcutaneous QHS  . insulin aspart  10 Units Subcutaneous TID WC  . insulin glargine  70 Units Subcutaneous Daily  . metroNIDAZOLE  500 mg Oral 3 times per day  . nystatin   Topical TID  . polyethylene glycol  17 g Oral Daily  . pregabalin  50 mg Oral BID  . Vitamin D (Ergocalciferol)  50,000 Units Oral Q7 days   Continuous Infusions:   PRN Meds:.acetaminophen, alum & mag hydroxide-simeth, HYDROmorphone (DILAUDID) injection, ondansetron **OR** ondansetron (ZOFRAN) IV, oxyCODONE-acetaminophen, sodium chloride   Antibiotics   Anti-infectives    Start     Dose/Rate Route Frequency Ordered Stop   05/12/15 1400  metroNIDAZOLE (FLAGYL) tablet 500 mg     500 mg Oral 3 times per day 05/12/15 1034     05/10/15 2200  ceftaroline (TEFLARO) 600 mg in sodium chloride 0.9 % 250 mL IVPB     600 mg 250 mL/hr over 60 Minutes Intravenous Every 12 hours 05/10/15 1226     05/09/15 2200  ceftaroline (TEFLARO) 400 mg in sodium chloride 0.9 % 250 mL IVPB  Status:  Discontinued     400 mg 250 mL/hr over 60 Minutes Intravenous Every 12 hours 05/09/15 1145 05/10/15 1226   05/07/15 2200  ceftaroline (TEFLARO) 300 mg in sodium chloride 0.9 % 250 mL IVPB  Status:  Discontinued     300 mg 250 mL/hr over 60 Minutes Intravenous Every 12 hours 05/07/15 1552 05/09/15 1145   05/06/15 0900   fluconazole (DIFLUCAN) tablet 150 mg     150 mg Oral  Once 05/06/15 0840 05/06/15 1234   05/03/15 2200  piperacillin-tazobactam (ZOSYN) IVPB 2.25 g  Status:  Discontinued     2.25 g 100 mL/hr over 30 Minutes Intravenous 3 times per day 05/03/15 1514 05/07/15 1552   05/03/15 1000  linezolid (ZYVOX) IVPB 600 mg  Status:  Discontinued     600 mg 300 mL/hr over 60 Minutes Intravenous Every 12 hours 05/03/15 0752 05/06/15 1609   05/03/15 0800  DAPTOmycin (CUBICIN) 1,000 mg in sodium chloride 0.9 % IVPB  Status:  Discontinued     1,000 mg  240 mL/hr over 30 Minutes Intravenous Every 48 hours 05/02/15 1515 05/03/15 0751   05/01/15 0200  cefTRIAXone (ROCEPHIN) 2 g in dextrose 5 % 50 mL IVPB - Premix  Status:  Discontinued     2 g 100 mL/hr over 30 Minutes Intravenous Every 24 hours 04/30/15 0427 04/30/15 0808   04/30/15 1900  ceFAZolin (ANCEF) 3 g in dextrose 5 % 50 mL IVPB     3 g 160 mL/hr over 30 Minutes Intravenous  Once 04/30/15 1854 04/30/15 1946   04/30/15 1200  vancomycin (VANCOCIN) 1,500 mg in sodium chloride 0.9 % 500 mL IVPB  Status:  Discontinued     1,500 mg 250 mL/hr over 120 Minutes Intravenous Every 8 hours 04/30/15 0436 05/01/15 1350   04/30/15 0830  piperacillin-tazobactam (ZOSYN) IVPB 3.375 g  Status:  Discontinued     3.375 g 12.5 mL/hr over 240 Minutes Intravenous 3 times per day 04/30/15 0824 05/03/15 1514   04/30/15 0445  vancomycin (VANCOCIN) 1,500 mg in sodium chloride 0.9 % 500 mL IVPB     1,500 mg 250 mL/hr over 120 Minutes Intravenous  Once 04/30/15 0436 04/30/15 0707   04/30/15 0315  cefTRIAXone (ROCEPHIN) 2 g in dextrose 5 % 50 mL IVPB     2 g 100 mL/hr over 30 Minutes Intravenous  Once 04/30/15 0312 04/30/15 0433   04/30/15 0115  vancomycin (VANCOCIN) IVPB 1000 mg/200 mL premix     1,000 mg 200 mL/hr over 60 Minutes Intravenous  Once 04/30/15 0111 04/30/15 0302        Subjective:   Lindsay [name] moving better, neuropathy pain decreased with  lyrica  Objective:   Blood pressure 123/56, pulse 86, temperature 98.1 F (36.7 C), temperature source Oral, resp. rate 18, height 5\' 8"  (1.727 m), weight 159.666 kg (352 lb), last menstrual period 04/23/2015, SpO2 97 %.  Wt Readings from Last 3 Encounters:  04/30/15 159.666 kg (352 lb)  04/03/15 156.945 kg (346 lb)  02/13/15 154.949 kg (341 lb 9.6 oz)     Intake/Output Summary (Last 24 hours) at 05/13/15 1030 Last data filed at 05/13/15 0900  Gross per 24 hour  Intake   1080 ml  Output      0 ml  Net   1080 ml    Exam  GeneralIs  AAOx3, morbidly obese, no distress  Neck: Supple, no JVD, no masses  CVS: S1 S2 /RRR  Respiratory Clear to auscultation bilaterally  Abdomen: Soft, nontender, nondistended, + bowel sounds, fungal infection, abdominal skin folds. Breast folds.   Ext:  dressing intact on the right chest and shoulder . LE with plus 1 edema.   Skin:  dressing intact on the right chest and shoulder    Data Review   Micro Results No results found for this or any previous visit (from the past 240 hour(s)).  Radiology Reports Dg Shoulder Right  04/19/2015   CLINICAL DATA:  Acute onset of right shoulder pain. Heard right shoulder pop 2 days ago. Initial encounter.  EXAM: RIGHT SHOULDER - 2+ VIEW  COMPARISON:  None.  FINDINGS: There is no evidence of fracture or dislocation. The right humeral head is seated within the glenoid fossa. The acromioclavicular joint is unremarkable in appearance. No significant soft tissue abnormalities are seen. The visualized portions of the right lung are clear.  IMPRESSION: No evidence of fracture or dislocation.   Electronically Signed   By: Roanna Raider M.D.   On: 04/19/2015 01:26   Ct Shoulder Right Wo Contrast  04/27/2015   CLINICAL DATA:  Constant right shoulder pain for 10 days  EXAM: CT OF THE RIGHT SHOULDER WITHOUT CONTRAST  TECHNIQUE: Multidetector CT imaging was performed according to the standard protocol. Multiplanar CT  image reconstructions were also generated.  COMPARISON:  Radiographs 04/19/2015  FINDINGS: There is no fracture or dislocation. There is no bone lesion. There is mild osteolysis of the distal clavicle. This can be seen after trauma or with inflammatory arthropathies. It can be idiopathic. No soft tissue mass is evident. There is no abnormal fluid collection. Visible portions of the right ribs and chest wall appear unremarkable.  IMPRESSION: Osteolysis of the distal right clavicle, uncertain chronicity.   Electronically Signed   By: Ellery Plunk M.D.   On: 04/27/2015 03:43   US Renal  05/02/2015   CLINICAL DATA:  Acute renal failure  EXAM: RENAL / URINARY TRACT ULTRASOUND COMPLETE  COMPARISON:  None.  FINDINGS: Right Kidney:  Length: 13.4 cm. Echogenicity within normal limits. No mass or hydronephrosis visualized.  Left Kidney:  Length: 12.8 cm. Echogenicity within normal limits. No mass or hydronephrosis visualized.  Bladder:  Empty and therefore not well evaluated  IMPRESSION: Study limited by body habitus but appears normal.   Electronically Signed   By: Esperanza Heir M.D.   On: 05/02/2015 16:47   Mr Shoulder Right Wo Contrast  04/30/2015   CLINICAL DATA:  Right shoulder pain. Recent history of antibiotic use. Patient was in too much pain to continue with the contrasted portion of the exam.  EXAM: MRI OF THE RIGHT SHOULDER WITHOUT CONTRAST  TECHNIQUE: Multiplanar, multisequence MR imaging of the shoulder was performed. No intravenous contrast was administered.  COMPARISON:  None.  FINDINGS: Rotator cuff: Mild tendinosis of the supraspinatus and infraspinatus tendon. Teres minor tendon is intact. Subscapularis tendon is intact.  Muscles: There is severe muscle edema in the subscapularis muscle with small 5 x 6 x 25 mm fluid collection. There is severe muscle edema in the anterior and posterior deltoid muscles with a 14 mm fluid collection in the deltoid muscle. There is soft tissue edema superficial to  the subscapularis and infraspinatus muscles. There is mild muscle edema along the periphery of the teres minor muscle.  Biceps long head:  Intact.  Acromioclavicular Joint: Mild degenerative change of the acromioclavicular joint. Type II acromion.  Glenohumeral Joint: Large joint effusion.  No chondral defect.  Labrum:  Intact.  Bones: No focal marrow signal abnormality. No fracture or dislocation.  IMPRESSION: 1. Severe muscle edema in the subscapularis muscle with small 5 x 6 x 25 mm fluid collection. There is severe muscle edema in the anterior and posterior deltoid muscles with a 14 mm fluid collection in the deltoid muscle. Soft tissue edema superficial to the subscapularis and infraspinatus muscles. Mild muscle edema along the periphery of the teres minor muscle. The overall appearance is most concerning for myositis which may be secondary to an infectious or inflammatory etiology. The small fluid collections make the process more concerning for an infectious etiology. 2. Large glenohumeral joint effusion. Septic arthritis cannot be excluded.   Electronically Signed   By: Elige Ko   On: 04/30/2015 10:45   Ir US Guide Vasc Access Left  05/04/2015   CLINICAL DATA:  Cellulitis  EXAM: TUNNELED LEFT INTERNAL JUGULAR PICC LINE PLACEMENT WITH ULTRASOUND AND FLUOROSCOPIC GUIDANCE  FLUOROSCOPY TIME:  12 seconds.  PROCEDURE: The patient was advised of the possible risks andcomplications and agreed to undergo the procedure. The patient was  then brought to the angiographic suite for the procedure.  The left neck was prepped with chlorhexidine, drapedin the usual sterile fashion using maximum barrier technique (cap and mask, sterile gown, sterile gloves, large sterile sheet, hand hygiene and cutaneous antisepsis) and infiltrated locally with 1% Lidocaine.  Ultrasound demonstrated patency of the left jugular vein, and this was documented with an image. Under real-time ultrasound guidance, this vein was accessed with  a 21 gauge micropuncture needle and image documentation was performed. A long tract was utilized to create a tunnel. A 0.018 wire was introduced in to the vein. Over this, a 5 [place] single lumen Power tunneled PICC was advanced to the lower SVC/right atrial junction. The cuff was positioned in the subcutaneous tract. Fluoroscopy during the procedure and fluoro spot radiograph confirms appropriate catheter position. The catheter was flushed and covered with asterile dressing.  Catheter length: 24 cm  COMPLICATIONS: None  IMPRESSION: Successful left jugular tunneled power PICC line placement with ultrasound and fluoroscopic guidance. The catheter is ready for use.   Electronically Signed   By: Jolaine Click M.D.   On: 05/04/2015 08:07   Ir Fluoro Guide Cv Midline Picc Left  05/04/2015   CLINICAL DATA:  Cellulitis  EXAM: TUNNELED LEFT INTERNAL JUGULAR PICC LINE PLACEMENT WITH ULTRASOUND AND FLUOROSCOPIC GUIDANCE  FLUOROSCOPY TIME:  12 seconds.  PROCEDURE: The patient was advised of the possible risks andcomplications and agreed to undergo the procedure. The patient was then brought to the angiographic suite for the procedure.  The left neck was prepped with chlorhexidine, drapedin the usual sterile fashion using maximum barrier technique (cap and mask, sterile gown, sterile gloves, large sterile sheet, hand hygiene and cutaneous antisepsis) and infiltrated locally with 1% Lidocaine.  Ultrasound demonstrated patency of the left jugular vein, and this was documented with an image. Under real-time ultrasound guidance, this vein was accessed with a 21 gauge micropuncture needle and image documentation was performed. A long tract was utilized to create a tunnel. A 0.018 wire was introduced in to the vein. Over this, a 5 [place] single lumen Power tunneled PICC was advanced to the lower SVC/right atrial junction. The cuff was positioned in the subcutaneous tract. Fluoroscopy during the procedure and fluoro spot radiograph  confirms appropriate catheter position. The catheter was flushed and covered with asterile dressing.  Catheter length: 24 cm  COMPLICATIONS: None  IMPRESSION: Successful left jugular tunneled power PICC line placement with ultrasound and fluoroscopic guidance. The catheter is ready for use.   Electronically Signed   By: Jolaine Click M.D.   On: 05/04/2015 08:07    CBC  Recent Labs Lab 05/08/15 0511 05/09/15 0458 05/10/15 0446 05/11/15 0450 05/13/15 0532  WBC 11.0* 8.8 10.2 9.6 7.5  HGB 7.6* 7.4* 8.8* 8.7* 7.8*  HCT 25.2* 24.8* 29.4* 28.3* 26.5*  PLT 536* 457* 510* 499* 396  MCV 79.0 79.7 81.7 82.5 83.1  MCH 23.8* 23.8* 24.4* 25.4* 24.5*  MCHC 30.2 29.8* 29.9* 30.7 29.4*  RDW 15.7* 16.0* 15.9* 16.2* 16.9*    Chemistries   Recent Labs Lab 05/09/15 0458 05/10/15 0446 05/11/15 0450 05/12/15 0530 05/13/15 0537  NA 138 139 140 142 142  K 4.6 4.7 4.8 4.6 4.8  CL 98* 101 103 106 108  CO2 30 29 27 27 27   GLUCOSE 206* 94 92 89 94  BUN 41* 32* 27* 20 17  CREATININE 3.30* 2.61* 2.20* 1.93* 1.89*  CALCIUM 7.6* 8.7* 8.7* 8.9 8.7*   ------------------------------------------------------------------------------------------------------------------ estimated creatinine clearance is 68.3  mL/min (by C-G formula based on Cr of 1.89). ------------------------------------------------------------------------------------------------------------------ No results for input(s): HGBA1C in the last 72 hours. ------------------------------------------------------------------------------------------------------------------ No results for input(s): CHOL, HDL, LDLCALC, TRIG, CHOLHDL, LDLDIRECT in the last 72 hours. ------------------------------------------------------------------------------------------------------------------ No results for input(s): TSH, T4TOTAL, T3FREE, THYROIDAB in the last 72 hours.  Invalid input(s):  FREET3 ------------------------------------------------------------------------------------------------------------------ No results for input(s): VITAMINB12, FOLATE, FERRITIN, TIBC, IRON, RETICCTPCT in the last 72 hours.  Coagulation profile No results for input(s): INR, PROTIME in the last 168 hours.  No results for input(s): DDIMER in the last 72 hours.  Cardiac Enzymes No results for input(s): CKMB, TROPONINI, MYOGLOBIN in the last 168 hours.  Invalid input(s): CK ------------------------------------------------------------------------------------------------------------------ Invalid input(s): POCBNP   Recent Labs  05/11/15 2135 05/12/15 0630 05/12/15 1140 05/12/15 1606 05/12/15 2305 05/13/15 0625  GLUCAP 119* 73 121* 136* 110* 81     Lindsay [name] M.D. [facility] 05/13/2015, 10:30 AM  Pager: [phone]  After 7pm go to [email or web address] - password [email or web address]  Call night coverage person covering after 7pm

## 2015-05-13 NOTE — Progress Notes (Addendum)
INFECTIOUS DISEASE PROGRESS NOTE  ID: Lindsay [name] is a 34 y.o. 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   Obesity   HTN (hypertension)   Hyponatremia   Abscess   Abscess of right shoulder   ARF (acute renal failure)   Septic arthritis   Cellulitis of right upper arm   Anaerobic abscess  Subjective: Resting quietly  Abtx:  Anti-infectives    Start     Dose/Rate Route Frequency Ordered Stop   05/12/15 1400  metroNIDAZOLE (FLAGYL) tablet 500 mg     500 mg Oral 3 times per day 05/12/15 1034     05/10/15 2200  ceftaroline (TEFLARO) 600 mg in sodium chloride 0.9 % 250 mL IVPB     600 mg 250 mL/hr over 60 Minutes Intravenous Every 12 hours 05/10/15 1226     05/09/15 2200  ceftaroline (TEFLARO) 400 mg in sodium chloride 0.9 % 250 mL IVPB  Status:  Discontinued     400 mg 250 mL/hr over 60 Minutes Intravenous Every 12 hours 05/09/15 1145 05/10/15 1226   05/07/15 2200  ceftaroline (TEFLARO) 300 mg in sodium chloride 0.9 % 250 mL IVPB  Status:  Discontinued     300 mg 250 mL/hr over 60 Minutes Intravenous Every 12 hours 05/07/15 1552 05/09/15 1145   05/06/15 0900  fluconazole (DIFLUCAN) tablet 150 mg     150 mg Oral  Once 05/06/15 0840 05/06/15 1234   05/03/15 2200  piperacillin-tazobactam (ZOSYN) IVPB 2.25 g  Status:  Discontinued     2.25 g 100 mL/hr over 30 Minutes Intravenous 3 times per day 05/03/15 1514 05/07/15 1552   05/03/15 1000  linezolid (ZYVOX) IVPB 600 mg  Status:  Discontinued     600 mg 300 mL/hr over 60 Minutes Intravenous Every 12 hours 05/03/15 0752 05/06/15 1609   05/03/15 0800  DAPTOmycin (CUBICIN) 1,000 mg in sodium chloride 0.9 % IVPB  Status:  Discontinued     1,000 mg 240 mL/hr over 30 Minutes Intravenous Every 48 hours 05/02/15 1515 05/03/15 0751   05/01/15 0200  cefTRIAXone (ROCEPHIN) 2 g in dextrose 5 % 50 mL IVPB - Premix  Status:   Discontinued     2 g 100 mL/hr over 30 Minutes Intravenous Every 24 hours 04/30/15 0427 04/30/15 0808   04/30/15 1900  ceFAZolin (ANCEF) 3 g in dextrose 5 % 50 mL IVPB     3 g 160 mL/hr over 30 Minutes Intravenous  Once 04/30/15 1854 04/30/15 1946   04/30/15 1200  vancomycin (VANCOCIN) 1,500 mg in sodium chloride 0.9 % 500 mL IVPB  Status:  Discontinued     1,500 mg 250 mL/hr over 120 Minutes Intravenous Every 8 hours 04/30/15 0436 05/01/15 1350   04/30/15 0830  piperacillin-tazobactam (ZOSYN) IVPB 3.375 g  Status:  Discontinued     3.375 g 12.5 mL/hr over 240 Minutes Intravenous 3 times per day 04/30/15 0824 05/03/15 1514   04/30/15 0445  vancomycin (VANCOCIN) 1,500 mg in sodium chloride 0.9 % 500 mL IVPB     1,500 mg 250 mL/hr over 120 Minutes Intravenous  Once 04/30/15 0436 04/30/15 0707   04/30/15 0315  cefTRIAXone (ROCEPHIN) 2 g in dextrose 5 % 50 mL IVPB     2 g 100 mL/hr over 30 Minutes Intravenous  Once 04/30/15 0312 04/30/15 0433   04/30/15 0115  vancomycin (VANCOCIN) IVPB 1000 mg/200 mL premix  1,000 mg 200 mL/hr over 60 Minutes Intravenous  Once 04/30/15 0111 04/30/15 0302      Medications:  Scheduled: . bisacodyl  10 mg Rectal Once  . ceFTAROline (TEFLARO) IV  600 mg Intravenous Q12H  . clotrimazole  1 Applicatorful Vaginal QHS  . docusate sodium  100 mg Oral BID  . feeding supplement (ENSURE)  1 Container Oral TID BM  . ferrous sulfate  325 mg Oral BID WC  . gabapentin  200 mg Oral TID  . heparin subcutaneous  5,000 Units Subcutaneous 3 times per day  . insulin aspart  0-20 Units Subcutaneous TID WC  . insulin aspart  0-5 Units Subcutaneous QHS  . insulin aspart  10 Units Subcutaneous TID WC  . insulin glargine  70 Units Subcutaneous Daily  . metroNIDAZOLE  500 mg Oral 3 times per day  . nystatin   Topical TID  . polyethylene glycol  17 g Oral Daily  . pregabalin  50 mg Oral BID  . Vitamin D (Ergocalciferol)  50,000 Units Oral Q7 days    Objective: Vital  signs in last 24 hours: Temp:  [98.1 F (36.7 C)-98.9 F (37.2 C)] 98.4 F (36.9 C) (05/23 1408) Pulse Rate:  [86-90] 87 (05/23 1408) Resp:  [18] 18 (05/23 1408) BP: (123-141)/(56-84) 141/84 mmHg (05/23 1408) SpO2:  [97 %-100 %] 100 % (05/23 1408)   General appearance: no distress  Lab Results  Recent Labs  05/11/15 0450 05/12/15 0530 05/13/15 0532 05/13/15 0537  WBC 9.6  --  7.5  --   HGB 8.7*  --  7.8*  --   HCT 28.3*  --  26.5*  --   NA 140 142  --  142  K 4.8 4.6  --  4.8  CL 103 106  --  108  CO2 27 27  --  27  BUN 27* 20  --  17  CREATININE 2.20* 1.93*  --  1.89*   Liver Panel  Recent Labs  05/12/15 0530 05/13/15 0537  ALBUMIN 1.9* 1.8*   Sedimentation Rate No results for input(s): ESRSEDRATE in the last 72 hours. C-Reactive Protein No results for input(s): CRP in the last 72 hours.  Microbiology: No results found for this or any previous visit (from the past 240 hour(s)).  Studies/Results: No results found.   Assessment/Plan: Septic arthritis (R shoulder), chest wall abscess, myositis Debrided 5-10 Cx- P bivia DM2 (since 2004) Acute Renal Failure [place] Obesity (BMI 53.53) LE edema Protein- albumin malnutrition, severe  Total days of antibiotics: 14 (ceftaroline day 7, flagyl day 3)   Will change her to invanz Her CrCl is 39 so she can get normal dosing.  Plan for 28 more days.  Glad to see if needed in clinic        Johny Sax Infectious Diseases (pager) [phone] [email or web address] 05/13/2015, 2:18 PM  LOS: 13 days

## 2015-05-13 NOTE — Care Management Note (Signed)
Case Management Note  Patient Details  Name: EDIS DRACH MRN: [id number] Date of Birth: 1981-03-25  Subjective/Objective:   34 yr old female admitted with left shoulder and chest wall abscess. Patient underwent  I & D .               Action/Plan: Case manager spoke with patient concerning her need for Home Health RN for IV antibiotics. Referral was called to [facility] last week.  Patient  Is checking to find someone that will assist her with administering antibiotics at home. To get PICC line.  Expected Discharge Date:  05/13/15                 Expected Discharge Plan:  Home w Home Health Services  In-House Referral:  NA  Discharge planning Services  CM Consult  Post Acute Care Choice:  Home Health Choice offered to:     DME Arranged:    DME Agency:     HH Arranged:  RN HH Agency:  [facility]  Status of Service:  Completed.  Medicare Important Message Given:    Date Medicare IM Given:    Medicare IM give by:    Date Additional Medicare IM Given:    Additional Medicare Important Message give by:     If discussed at Long Length of Stay Meetings, dates discussed:    Additional Comments:  Durenda Guthrie, RN 05/13/2015, 1:21 PM

## 2015-05-13 NOTE — Progress Notes (Signed)
Initial Nutrition Assessment  DOCUMENTATION CODES:  Morbid obesity  INTERVENTION:  Ensure Enlive (each supplement provides 350kcal and 20 grams of protein), MVI, Prostat  NUTRITION DIAGNOSIS:  Increased nutrient needs related to wound healing as evidenced by estimated needs.   GOAL:  Patient will meet greater than or equal to 90% of their needs   MONITOR:  PO intake, Supplement acceptance, Labs, Weight trends, I & O's  REASON FOR ASSESSMENT:  Consult Diet education (Severe PCM)  ASSESSMENT: 34 year old female with obesity, asthma, diabetes, hypertension presented to ED with right shoulder worsening pain. Patient was recently seen in the ED and had a small I&D done in ER and was placed on doxycycline. Patient however continued to have increased pain but no subjective fevers or chills. Now 13 days s/p irrigation and debridement of right shoulder.  Per MD note, nutrition consult regarding severe protein calorie malnutrition. Pt states that she usually weighs between 330 and 340 lbs. She reports having a good appetite and eating well. She states that she has been instructed to eat more protein. RD reviewed foods sources of protein; also encouraged intake of vitamin C rich foods, fruits, and vegetables. Pt agreeable to receiving protein supplements.  RD offered nutrition education for blood glucose control; however, pt declined stating she knows what to eat and how to control her glucose.   Labs: low albumin, low calcium elevated phosphorus, low hemglobin Note: Albumin has a half-life of 21 days and is strongly affected by stress response and inflammatory process, therefore, do not expect to see an improvement in this lab value during acute hospitalization.  Height:  Ht Readings from Last 1 Encounters:  04/29/15 5\' 8"  (1.727 m)    Weight:  Wt Readings from Last 1 Encounters:  04/30/15 352 lb (159.666 kg)    Ideal Body Weight:  63.6 kg  Wt Readings from Last 10  Encounters:  04/30/15 352 lb (159.666 kg)  04/03/15 346 lb (156.945 kg)  02/13/15 341 lb 9.6 oz (154.949 kg)  12/14/14 348 lb (157.852 kg)  10/10/14 335 lb (151.955 kg)  07/16/14 338 lb 6.4 oz (153.497 kg)    BMI:  Body mass index is 53.53 kg/(m^2).  Estimated Nutritional Needs:  Kcal:  2300-2600  Protein:  120-130 grams  Fluid:  >/= 2.6 L/day  Skin:   closed incision on right shoulder; + 2 RLE and LLE edema  Diet Order:  Diet Carb Modified Fluid consistency:: Thin; Room service appropriate?: Yes  EDUCATION NEEDS:  Education needs addressed   Intake/Output Summary (Last 24 hours) at 05/13/15 1409 Last data filed at 05/13/15 1300  Gross per 24 hour  Intake   1080 ml  Output      0 ml  Net   1080 ml    Last BM:  5/22   Ian Malkin RD, LDN Inpatient Clinical Dietitian Pager: [phone] After Hours Pager: [phone]

## 2015-05-13 NOTE — Discharge Summary (Addendum)
Physician Discharge Summary  Lindsay [name] JXB:[id number] DOB: Feb 12, 1981 DOA: 04/30/2015  PCP: Doris Cheadle, MD  Admit date: 04/30/2015 Discharge date: 05/13/2015  Time spent: 45 minutes  Recommendations for Outpatient Follow-up:  1. PCP in 1 week, please check Bmet, stop Invanz after 28days on 6/20 2. Dr.Hatcher [facility] in 1 month 3. Dr.Murphy in 1-2weeks 4. Please send patient to Interventional Radiology  to remove Left IJ tunnelled PICC after IV Abx compelted, cannot be removed by Home Health RN  Discharge Diagnoses:   Sepsis  Septic arthritis and abscess (R shoulder),   Chest wall abscess with myositis   Diabetes mellitus due to underlying condition without complications   AKI   Morbid Obesity   HTN (hypertension)   Hyponatremia   ARF (acute renal failure)   Hypoalbuminemia   Encephalopathy   Severe peripheral Neuropathy    Discharge Condition: stable  Diet recommendation: Diabetic  Filed Weights   04/29/15 2109 04/30/15 0437  Weight: 155.584 kg (343 lb) 159.666 kg (352 lb)    History of present illness:  Chief Complaint: increased right shoulder pain.  HPI: Lindsay [name] is an 34 y.o. female with hx of obesity, asthma, DM, HTN, presented to the ER  as her right shoulder had not gotten better. She was seen in the ER, recently and had a small I and D, and placed on Doxycycline.  Evaluation in the ER included a marked leukocytosis, with WBC of 18K, and elevated BS to the 400.  Hospital Course:    Cellulitis/Abscess of right shoulder and chest wall abscess/myositis -postop day # 13 - MRI of the right shoulder which showed severe muscle edema with small 5 x 6x 25 mm fluid collection overall appearance concerning for myositis due to infectious or inflammatory etiology, large glenohumeral joint effusion, possible septic arthritis - underwent I&D of the right shoulder with arthroscopy and patient was also found to have right chest wall subcutaneous abscess and  Dr. Derrell Lolling was consulted intraoperatively. Patient underwent I&D of the right chest wall abscess as well, on 04-30-2015. - Creatinine /GFR then worsened, vancomycin was discontinued. ID consult was obtained, patient seen by Dr. Ninetta Lights on 5/12, recommended to change to Zyvox and Zosyn. Zyvox stopped 5-16 due to concern for neuropathy.  -day13 of IV antibiotics, now on Teflaro Day 7, Wound Cx now with after 12days grew Prevotella bivia. -Has Left IJ Tunneled PICC placed by IR Dr.Hoss 5/13, can go home with this but will need to be removed in Radiology after IV therapy completed -seen by Dr.Hatcher with ID today and recommended days of IV Abx now IV Invanz at normal renal dosing till 6/20-STOP DATE -i have requested ID clinic FU, office will call her with appt  Encephalopathy; -resolved -Suspect multifactorial; Sepsis, metabolic acidosis, renal failure.  -gabapentin dose decreased to 100 mg TID due to renal failure.   Anemia -due to iron defi and acute illness -due to Acute illness, AKI, and heavy mentrual cycles -transfused 1 unit PRBC 5/19, improved now, monitor -given IV iron 5/18 -stable now, discharge home on Po iron  Diabetes mellitus due to underlying condition :  - Blood sugars stable  - Continue lantus 65units and novolog meal coverage at DC  Acute kidney injury: - suspect ATN due to sepsis, Vanc toxicity,. Baseline creatinine normal - Creatinine peaked at 5.8, improving now 1.89,  - good urine output - Vancomycin was discontinued and renal consulted, renal ultrasound showed no renal obstruction. - still auto diuresing -renal signed off -recommend  getting FU Bmet in 1 week  Severe protein calorie malnutrition -albumin slowly improving, add ensure -RD consulted  Right upper extremity swelling - doppler negative for DVT  Severe peripheral neuropathy- from DM -improving, continue lyrica, on gabapentin at reduced dose due to AKI  Obesity - Patient counseled on  diet and weight control  HTN (hypertension) -Currently BP borderline stable, not on any antihypertensive  Hyponatremia: resolved, related to renal failure and pseudohyponatremia from hyperglycemia.       PROCEDURE: IRRIGATION AND DEBRIDEMENT SHOULDER, ARTHROSCOPY SHOULDER dr.Murphy 5/10   Procedure(s): I&D of right chest wall abscess Dr.Ramirez 5/10  Left IJ Tunneled PICC placed by IR Dr.Hoss 5/13  Consultations:  Dr.Hatcher   Ortho Dr.Murphy  Discharge Exam: Filed Vitals:   05/13/15 1408  BP: 141/84  Pulse: 87  Temp: 98.4 F (36.9 C)  Resp: 18    General: AAOx3 Cardiovascular: S1S2/RRR Respiratory: CTAB  Discharge Instructions   Discharge Instructions    Diet - low sodium heart healthy    Complete by:  As directed      Diet Carb Modified    Complete by:  As directed      Increase activity slowly    Complete by:  As directed           Current Discharge Medication List    START taking these medications   Details  ertapenem 1 g in sodium chloride 0.9 % 50 mL Inject 1 g into the vein daily. Qty: 29 g, Refills: 0    ferrous sulfate 325 (65 FE) MG tablet Take 1 tablet (325 mg total) by mouth 2 (two) times daily with a meal. Qty: 60 tablet, Refills: 3    polyethylene glycol (MIRALAX / GLYCOLAX) packet Take 17 g by mouth daily. Qty: 14 each, Refills: 0    pregabalin (LYRICA) 50 MG capsule Take 1 capsule (50 mg total) by mouth 2 (two) times daily. Qty: 60 capsule, Refills: 0      CONTINUE these medications which have CHANGED   Details  gabapentin (NEURONTIN) 100 MG capsule Take 1 capsule (100 mg total) by mouth 3 (three) times daily. Qty: 60 capsule, Refills: 0   Associated Diagnoses: Numbness and tingling    insulin aspart (NOVOLOG) 100 UNIT/ML FlexPen Inject 10 Units into the skin 3 (three) times daily with meals. Inject 20 units with breakfast and lunch, and 25 units before supper daily, subcutaneous   Associated Diagnoses: Other specified diabetes  mellitus without complications      CONTINUE these medications which have NOT CHANGED   Details  aspirin 81 MG tablet Take 81 mg by mouth daily.    insulin glargine (LANTUS) 100 unit/mL SOPN Inject 0.65 mLs (65 Units total) into the skin at bedtime. Qty: 15 pen, Refills: 3   Associated Diagnoses: Other specified diabetes mellitus without complications    oxyCODONE-acetaminophen (PERCOCET) 5-325 MG per tablet Take 1-2 tablets by mouth every 4 (four) hours as needed for moderate pain. Qty: 20 tablet, Refills: 0    Vitamin D, Ergocalciferol, (DRISDOL) 50000 UNITS CAPS capsule Take 1 capsule (50,000 Units total) by mouth every 7 (seven) days. Qty: 12 capsule, Refills: 0      STOP taking these medications     doxycycline (VIBRAMYCIN) 100 MG capsule      lisinopril (PRINIVIL,ZESTRIL) 5 MG tablet      meloxicam (MOBIC) 15 MG tablet      metFORMIN (GLUCOPHAGE) 1000 MG tablet        No Known Allergies Follow-up Information  Follow up with Lajean Saver, MD. Schedule an appointment as soon as possible for a visit in 2 weeks.   Specialty:  General Surgery   Why:  For wound re-check   Contact information:   [address] [place] [address] [phone]       Follow up with MURPHY, TIMOTHY D, MD In 1 week.   Specialty:  Orthopedic Surgery   Contact information:   [address] [place] [address] [phone]       Follow up with [facility].   Why:  Someone from [facility] will contact you concerning start date and time for Home Health RN.   Contact information:   [address] [place] [address] [phone]        The results of significant diagnostics from this hospitalization (including imaging, microbiology, ancillary and laboratory) are listed below for reference.    Significant Diagnostic Studies: Dg Shoulder Right  04/19/2015   CLINICAL DATA:  Acute onset of right shoulder pain.  Heard right shoulder pop 2 days ago. Initial encounter.  EXAM: RIGHT SHOULDER - 2+ VIEW  COMPARISON:  None.  FINDINGS: There is no evidence of fracture or dislocation. The right humeral head is seated within the glenoid fossa. The acromioclavicular joint is unremarkable in appearance. No significant soft tissue abnormalities are seen. The visualized portions of the right lung are clear.  IMPRESSION: No evidence of fracture or dislocation.   Electronically Signed   By: Roanna Raider M.D.   On: 04/19/2015 01:26   Ct Shoulder Right Wo Contrast  04/27/2015   CLINICAL DATA:  Constant right shoulder pain for 10 days  EXAM: CT OF THE RIGHT SHOULDER WITHOUT CONTRAST  TECHNIQUE: Multidetector CT imaging was performed according to the standard protocol. Multiplanar CT image reconstructions were also generated.  COMPARISON:  Radiographs 04/19/2015  FINDINGS: There is no fracture or dislocation. There is no bone lesion. There is mild osteolysis of the distal clavicle. This can be seen after trauma or with inflammatory arthropathies. It can be idiopathic. No soft tissue mass is evident. There is no abnormal fluid collection. Visible portions of the right ribs and chest wall appear unremarkable.  IMPRESSION: Osteolysis of the distal right clavicle, uncertain chronicity.   Electronically Signed   By: Ellery Plunk M.D.   On: 04/27/2015 03:43   US Renal  05/02/2015   CLINICAL DATA:  Acute renal failure  EXAM: RENAL / URINARY TRACT ULTRASOUND COMPLETE  COMPARISON:  None.  FINDINGS: Right Kidney:  Length: 13.4 cm. Echogenicity within normal limits. No mass or hydronephrosis visualized.  Left Kidney:  Length: 12.8 cm. Echogenicity within normal limits. No mass or hydronephrosis visualized.  Bladder:  Empty and therefore not well evaluated  IMPRESSION: Study limited by body habitus but appears normal.   Electronically Signed   By: Esperanza Heir M.D.   On: 05/02/2015 16:47   Mr Shoulder Right Wo Contrast  04/30/2015    CLINICAL DATA:  Right shoulder pain. Recent history of antibiotic use. Patient was in too much pain to continue with the contrasted portion of the exam.  EXAM: MRI OF THE RIGHT SHOULDER WITHOUT CONTRAST  TECHNIQUE: Multiplanar, multisequence MR imaging of the shoulder was performed. No intravenous contrast was administered.  COMPARISON:  None.  FINDINGS: Rotator cuff: Mild tendinosis of the supraspinatus and infraspinatus tendon. Teres minor tendon is intact. Subscapularis tendon is intact.  Muscles: There is severe muscle edema in the  subscapularis muscle with small 5 x 6 x 25 mm fluid collection. There is severe muscle edema in the anterior and posterior deltoid muscles with a 14 mm fluid collection in the deltoid muscle. There is soft tissue edema superficial to the subscapularis and infraspinatus muscles. There is mild muscle edema along the periphery of the teres minor muscle.  Biceps long head:  Intact.  Acromioclavicular Joint: Mild degenerative change of the acromioclavicular joint. Type II acromion.  Glenohumeral Joint: Large joint effusion.  No chondral defect.  Labrum:  Intact.  Bones: No focal marrow signal abnormality. No fracture or dislocation.  IMPRESSION: 1. Severe muscle edema in the subscapularis muscle with small 5 x 6 x 25 mm fluid collection. There is severe muscle edema in the anterior and posterior deltoid muscles with a 14 mm fluid collection in the deltoid muscle. Soft tissue edema superficial to the subscapularis and infraspinatus muscles. Mild muscle edema along the periphery of the teres minor muscle. The overall appearance is most concerning for myositis which may be secondary to an infectious or inflammatory etiology. The small fluid collections make the process more concerning for an infectious etiology. 2. Large glenohumeral joint effusion. Septic arthritis cannot be excluded.   Electronically Signed   By: Elige Ko   On: 04/30/2015 10:45   Ir US Guide Vasc Access  Left  05/04/2015   CLINICAL DATA:  Cellulitis  EXAM: TUNNELED LEFT INTERNAL JUGULAR PICC LINE PLACEMENT WITH ULTRASOUND AND FLUOROSCOPIC GUIDANCE  FLUOROSCOPY TIME:  12 seconds.  PROCEDURE: The patient was advised of the possible risks andcomplications and agreed to undergo the procedure. The patient was then brought to the angiographic suite for the procedure.  The left neck was prepped with chlorhexidine, drapedin the usual sterile fashion using maximum barrier technique (cap and mask, sterile gown, sterile gloves, large sterile sheet, hand hygiene and cutaneous antisepsis) and infiltrated locally with 1% Lidocaine.  Ultrasound demonstrated patency of the left jugular vein, and this was documented with an image. Under real-time ultrasound guidance, this vein was accessed with a 21 gauge micropuncture needle and image documentation was performed. A long tract was utilized to create a tunnel. A 0.018 wire was introduced in to the vein. Over this, a 5 [place] single lumen Power tunneled PICC was advanced to the lower SVC/right atrial junction. The cuff was positioned in the subcutaneous tract. Fluoroscopy during the procedure and fluoro spot radiograph confirms appropriate catheter position. The catheter was flushed and covered with asterile dressing.  Catheter length: 24 cm  COMPLICATIONS: None  IMPRESSION: Successful left jugular tunneled power PICC line placement with ultrasound and fluoroscopic guidance. The catheter is ready for use.   Electronically Signed   By: Jolaine Click M.D.   On: 05/04/2015 08:07   Ir Fluoro Guide Cv Midline Picc Left  05/04/2015   CLINICAL DATA:  Cellulitis  EXAM: TUNNELED LEFT INTERNAL JUGULAR PICC LINE PLACEMENT WITH ULTRASOUND AND FLUOROSCOPIC GUIDANCE  FLUOROSCOPY TIME:  12 seconds.  PROCEDURE: The patient was advised of the possible risks andcomplications and agreed to undergo the procedure. The patient was then brought to the angiographic suite for the procedure.  The left neck  was prepped with chlorhexidine, drapedin the usual sterile fashion using maximum barrier technique (cap and mask, sterile gown, sterile gloves, large sterile sheet, hand hygiene and cutaneous antisepsis) and infiltrated locally with 1% Lidocaine.  Ultrasound demonstrated patency of the left jugular vein, and this was documented with an image. Under real-time ultrasound guidance, this vein was accessed  with a 21 gauge micropuncture needle and image documentation was performed. A long tract was utilized to create a tunnel. A 0.018 wire was introduced in to the vein. Over this, a 5 [place] single lumen Power tunneled PICC was advanced to the lower SVC/right atrial junction. The cuff was positioned in the subcutaneous tract. Fluoroscopy during the procedure and fluoro spot radiograph confirms appropriate catheter position. The catheter was flushed and covered with asterile dressing.  Catheter length: 24 cm  COMPLICATIONS: None  IMPRESSION: Successful left jugular tunneled power PICC line placement with ultrasound and fluoroscopic guidance. The catheter is ready for use.   Electronically Signed   By: Jolaine Click M.D.   On: 05/04/2015 08:07    Microbiology: No results found for this or any previous visit (from the past 240 hour(s)).   Labs: Basic Metabolic Panel:  Recent Labs Lab 05/09/15 0458 05/10/15 0446 05/11/15 0450 05/12/15 0530 05/13/15 0537  NA 138 139 140 142 142  K 4.6 4.7 4.8 4.6 4.8  CL 98* 101 103 106 108  CO2 30 29 27 27 27   GLUCOSE 206* 94 92 89 94  BUN 41* 32* 27* 20 17  CREATININE 3.30* 2.61* 2.20* 1.93* 1.89*  CALCIUM 7.6* 8.7* 8.7* 8.9 8.7*  PHOS 4.5 4.8* 4.7* 5.2* 5.3*   Liver Function Tests:  Recent Labs Lab 05/09/15 0458 05/10/15 0446 05/11/15 0450 05/12/15 0530 05/13/15 0537  ALBUMIN 1.6* 1.7* 1.8* 1.9* 1.8*   No results for input(s): LIPASE, AMYLASE in the last 168 hours. No results for input(s): AMMONIA in the last 168 hours. CBC:  Recent Labs Lab  05/08/15 0511 05/09/15 0458 05/10/15 0446 05/11/15 0450 05/13/15 0532  WBC 11.0* 8.8 10.2 9.6 7.5  HGB 7.6* 7.4* 8.8* 8.7* 7.8*  HCT 25.2* 24.8* 29.4* 28.3* 26.5*  MCV 79.0 79.7 81.7 82.5 83.1  PLT 536* 457* 510* 499* 396   Cardiac Enzymes:  Recent Labs Lab 05/06/15 1648  CKTOTAL 290*   BNP: BNP (last 3 results) No results for input(s): BNP in the last 8760 hours.  ProBNP (last 3 results) No results for input(s): PROBNP in the last 8760 hours.  CBG:  Recent Labs Lab 05/12/15 1140 05/12/15 1606 05/12/15 2305 05/13/15 0625 05/13/15 1124  GLUCAP 121* 136* 110* 81 127*       Signed:  Jhoselyn Ruffini  [facility] 05/13/2015, 2:59 PM

## 2015-05-13 NOTE — Progress Notes (Signed)
Occupational Therapy Treatment Patient Details Name: MAKENLY LARABEE MRN: [id number] DOB: 1981-10-03 Today's Date: 05/13/2015    History of present illness Pt is s/p I&D of the right shoulder with arthroscopy and I&D of the right chest wall abscess as well, on 04-30-2015. PMH includes: obesity, asthma, diabetes, hypertension, acute renal failure, septic arthritis.    OT comments  Pt. Making gains with skilled OT.  Tolerating all UE exercises well and able to follow hand outs provided.  Needs continued work with utilization of A/E for LB needs as she reports she is R hand dominant and struggling with incorporating use of LUE.  Will continue to follow acutely.    Follow Up Recommendations  Home health OT;Supervision/Assistance - 24 hour    Equipment Recommendations  Tub/shower seat    Recommendations for Other Services      Precautions / Restrictions Precautions Precautions: Fall Precaution Comments: skin breakdown under breasts;groin;under pannus Required Braces or Orthoses: Sling Restrictions RUE Weight Bearing: Weight bearing as tolerated       Mobility Bed Mobility Overal bed mobility: Needs Assistance Bed Mobility: Sit to Supine     Supine to sit: Min guard     General bed mobility comments: encouraged setting bed as it will be at home, pt. relies heavily on bed rails and keeping hob elevated even with max encouragement not to  Transfers Overall transfer level: Needs assistance Equipment used: None Transfers: Sit to/from Stand Sit to Stand: Supervision Stand pivot transfers: Supervision       General transfer comment: using momentum    Balance                                   ADL Overall ADL's : Needs assistance/impaired                         Toilet Transfer: Supervision/safety;Ambulation;BSC   Toileting- Clothing Manipulation and Hygiene: Total assistance;Sit to/from stand Toileting - Clothing Manipulation Details (indicate  cue type and reason): encouraged use of A/E that has been provided pt. continued to refuse stating they do not work and she "just could not do it".       Functional mobility during ADLs: Supervision/safety General ADL Comments: Pt moves very slow. OT assisted with sling.      Vision                     Perception     Praxis      Cognition   Behavior During Therapy: WFL for tasks assessed/performed Overall Cognitive Status: Within Functional Limits for tasks assessed                       Extremity/Trunk Assessment               Exercises Shoulder Exercises Pendulum Exercise: PROM;AAROM;Right;Standing;20 reps Elbow Flexion: AROM;AAROM;Right;20 reps;Standing Elbow Extension: AROM;AAROM;Right;20 reps;Standing Wrist Flexion: AROM;Right;20 reps Wrist Extension: AROM;Right;10 reps Composite Extension: AROM;Right;10 reps;Supine Other Exercises Other Exercises: Pt performed pendulums with Rt UE standing with LUE support on bed rail of locked bed Other Exercises: Pt performed approximately 10 reps each of right elbow flexion/extension (AAROM), wrist flexion/extension (AROM), and digit composite flexion/extension (AROM). Donning/doffing sling/immobilizer: Maximal assistance Correct positioning of sling/immobilizer: Moderate assistance Sling wearing schedule (on at all times/off for ADL's): Patient able to independently direct caregiver Proper positioning of operated UE when showering: Patient  able to independently direct caregiver   Shoulder Instructions Shoulder Instructions Donning/doffing sling/immobilizer: Maximal assistance Correct positioning of sling/immobilizer: Moderate assistance Sling wearing schedule (on at all times/off for ADL's): Patient able to independently direct caregiver Proper positioning of operated UE when showering: Patient able to independently direct caregiver     General Comments      Pertinent Vitals/ Pain       Pain  Assessment: No/denies pain  Home Living                                          Prior Functioning/Environment              Frequency Min 4X/week     Progress Toward Goals  OT Goals(current goals can now be found in the care plan section)  Progress towards OT goals: Progressing toward goals     Plan Discharge plan remains appropriate    Co-evaluation                 End of Session     Activity Tolerance Patient tolerated treatment well   Patient Left in chair;with call bell/phone within reach   Nurse Communication          Time: 0725-0818 OT Time Calculation (min): 53 min  Charges: OT General Charges $OT Visit: 1 Procedure OT Treatments $Self Care/Home Management : 23-37 mins $Therapeutic Exercise: 23-37 mins  Robet Leu, COTA/L 05/13/2015, 8:24 AM

## 2015-05-13 NOTE — Progress Notes (Signed)
     Subjective:  Patient reports pain as mild to moderate.  Up to a chair with OT today.  ID made some changes to abx over the weekend after growth showed prevotella.  Patient now complaining of yeast infection. Packing of wound was pulled Friday.   Objective:   VITALS:   Filed Vitals:   05/12/15 0524 05/12/15 1300 05/12/15 2050 05/13/15 0600  BP: 132/68 114/52 129/60 123/56  Pulse: 88 93 90 86  Temp: 98.5 F (36.9 C) 98.4 F (36.9 C) 98.9 F (37.2 C) 98.1 F (36.7 C)  TempSrc:   Oral   Resp: 18 20 18 18   Height:      Weight:      SpO2: 98% 98% 97% 97%    Neurologically intact ABD soft Neurovascular intact Sensation intact distally Intact pulses distally Dorsiflexion/Plantar flexion intact Incision: dressing C/D/I   Lab Results  Component Value Date   WBC 7.5 05/13/2015   HGB 7.8* 05/13/2015   HCT 26.5* 05/13/2015   MCV 83.1 05/13/2015   PLT 396 05/13/2015   BMET    Component Value Date/Time   NA 142 05/13/2015 0537   K 4.8 05/13/2015 0537   CL 108 05/13/2015 0537   CO2 27 05/13/2015 0537   GLUCOSE 94 05/13/2015 0537   BUN 17 05/13/2015 0537   CREATININE 1.89* 05/13/2015 0537   CREATININE 0.60 02/13/2015 1607   CALCIUM 8.7* 05/13/2015 0537   GFRNONAA 34* 05/13/2015 0537   GFRNONAA >89 02/13/2015 1607   GFRAA 39* 05/13/2015 0537   GFRAA >89 02/13/2015 1607     Assessment/Plan: 13 Days Post-Op   Principal Problem:   Cellulitis of left upper extremity Active Problems:   Diabetes mellitus due to underlying condition without complications   Obesity   HTN (hypertension)   Hyponatremia   Abscess   Abscess of right shoulder   ARF (acute renal failure)   Septic arthritis   Cellulitis of right upper arm   Anaerobic abscess   Up with therapy WBAT, sling for comfort only Continue abx per ID  Lindsay [name] Marie 05/13/2015, 9:01 AM Cell [phone]

## 2015-05-14 LAB — CBC
HEMATOCRIT: 26.9 % — AB (ref 36.0–46.0)
HEMOGLOBIN: 8 g/dL — AB (ref 12.0–15.0)
MCH: 24.6 pg — ABNORMAL LOW (ref 26.0–34.0)
MCHC: 29.7 g/dL — ABNORMAL LOW (ref 30.0–36.0)
MCV: 82.8 fL (ref 78.0–100.0)
Platelets: 343 10*3/uL (ref 150–400)
RBC: 3.25 MIL/uL — ABNORMAL LOW (ref 3.87–5.11)
RDW: 17 % — ABNORMAL HIGH (ref 11.5–15.5)
WBC: 7.4 10*3/uL (ref 4.0–10.5)

## 2015-05-14 LAB — GLUCOSE, CAPILLARY
GLUCOSE-CAPILLARY: 79 mg/dL (ref 65–99)
Glucose-Capillary: 97 mg/dL (ref 65–99)

## 2015-05-14 LAB — RENAL FUNCTION PANEL
ALBUMIN: 2 g/dL — AB (ref 3.5–5.0)
Anion gap: 8 (ref 5–15)
BUN: 16 mg/dL (ref 6–20)
CO2: 25 mmol/L (ref 22–32)
CREATININE: 1.73 mg/dL — AB (ref 0.44–1.00)
Calcium: 9.1 mg/dL (ref 8.9–10.3)
Chloride: 109 mmol/L (ref 101–111)
GFR calc Af Amer: 44 mL/min — ABNORMAL LOW (ref >60)
GFR calc non Af Amer: 38 mL/min — ABNORMAL LOW (ref >60)
Glucose, Bld: 89 mg/dL (ref 65–99)
Phosphorus: 5.4 mg/dL — ABNORMAL HIGH (ref 2.5–4.6)
Potassium: 4.9 mmol/L (ref 3.5–5.1)
Sodium: 142 mmol/L (ref 135–145)

## 2015-05-14 MED ORDER — GABAPENTIN 100 MG PO CAPS: 100.0000 mg | ORAL_CAPSULE | Freq: Two times a day (BID) | ORAL | Status: DC

## 2015-05-14 MED ORDER — OXYCODONE-ACETAMINOPHEN 5-325 MG PO TABS: 1.0000 | ORAL_TABLET | ORAL | Status: DC | PRN

## 2015-05-14 MED ORDER — UNABLE TO FIND
Status: DC
Start: 2015-05-14 — End: 2015-05-15

## 2015-05-14 MED ORDER — HEPARIN SOD (PORK) LOCK FLUSH 100 UNIT/ML IV SOLN
250.0000 [IU] | INTRAVENOUS | Status: DC | PRN
  Administered 2015-05-14: 250 [IU]
  Filled 2015-05-14 (×2): qty 3

## 2015-05-14 MED ORDER — INSULIN ASPART 100 UNIT/ML FLEXPEN: 10.0000 [IU] | PEN_INJECTOR | Freq: Three times a day (TID) | SUBCUTANEOUS | Status: DC

## 2015-05-14 MED ORDER — POLYETHYLENE GLYCOL 3350 17 G PO PACK: 17.0000 g | PACK | Freq: Every day | ORAL | Status: DC

## 2015-05-14 MED ORDER — HEPARIN SOD (PORK) LOCK FLUSH 100 UNIT/ML IV SOLN
250.0000 [IU] | Freq: Every day | INTRAVENOUS | Status: DC
  Filled 2015-05-14: qty 3

## 2015-05-14 NOTE — Care Management Note (Signed)
Case Management Note  Patient Details  Name: Lindsay [name] MRN: [id number] Date of Birth: 1981-11-09  Subjective/Objective:  Patient admitted with  Right shoulder and chest wall abscess. Patient underwent I & D.                 Action/Plan:  Patient will go home on IV antibiotics. Referral has been given to [place], [facility] Liaison. Pam, [facility] IV Nurse will speak with patient concerning IV administration.   Expected Discharge Date:                  Expected Discharge Plan:  Home w Home Health Services  In-House Referral:  NA  Discharge planning Services  CM Consult  Post Acute Care Choice:  Home Health Choice offered to:     DME Arranged:    DME Agency:     HH Arranged:  RN HH Agency:  [facility]  Status of Service:  Completed, signed off  Medicare Important Message Given:    Date Medicare IM Given:    Medicare IM give by:    Date Additional Medicare IM Given:    Additional Medicare Important Message give by:     If discussed at Long Length of Stay Meetings, dates discussed:    Additional Comments:  Durenda Guthrie, RN 05/14/2015, 10:58 AM

## 2015-05-14 NOTE — Progress Notes (Signed)
Chaplin Intern was referred by [facility] for an initial visit. Chaplin Intern stopped by to introduce himself. Chaplin Intern notice that patient was in PT. In the room was the patient, PT and the patient's grandmother Lindsay [name] Intern introduced himself to Patient, Grandmother and PT. After the introduction the Chaplin Intern left the room and will return at a later date.

## 2015-05-14 NOTE — Progress Notes (Signed)
Pt seen and examined No changes from DC summary yesterday, has friend taking her home today, went over discharge meds  Zannie Cove, MD [phone]

## 2015-05-14 NOTE — Progress Notes (Signed)
Occupational Therapy Treatment Patient Details Name: Lindsay [name] MRN: [id number] DOB: [birth year] Today's Date: 05/14/2015    History of present illness Pt is s/p I&D of the right shoulder with arthroscopy and I&D of the right chest wall abscess as well, on 04-30-2015. PMH includes: obesity, asthma, diabetes, hypertension, acute renal failure, septic arthritis.    OT comments  Pt progressing slowly towards goals. Pt continues to take more than a reasonable amount of time to complete tasks and requires up to max encouragement to participate. Pt will continue to benefit from skilled acute OT and follow-up HHOT.    Follow Up Recommendations  Home health OT;Supervision/Assistance - 24 hour    Equipment Recommendations  Tub/shower seat    Recommendations for Other Services  None at this time   Precautions / Restrictions Precautions Precautions: Fall Precaution Comments: skin breakdown under breasts;groin;under pannus;buttock Required Braces or Orthoses: Sling (for comfort) Restrictions Weight Bearing Restrictions: Yes RUE Weight Bearing: Weight bearing as tolerated Other Position/Activity Restrictions: ROM as tolerated       Mobility Bed Mobility General bed mobility comments: Pt found seated in recliner upon entering/exiting the room  Transfers Overall transfer level: Needs assistance Equipment used: None Transfers: Sit to/from Stand Sit to Stand: Supervision General transfer comment: Extra time needed.     Balance Overall balance assessment: Needs assistance Sitting-balance support: No upper extremity supported;Feet supported Sitting balance-Leahy Scale: Good     Standing balance support: Single extremity supported;During functional activity Standing balance-Leahy Scale: Fair   ADL Overall ADL's : Needs assistance/impaired General ADL Comments: Pt moves very slow, takes more than a reasonable amount of time to complete tasks. OT assisted with sling/positioning. Max  encouragement needed to be as independent as possible and incorporate RUE in ADL tasks. Pt ambualted > BR, worked on doffing underwear, refused to attempt hygiene, then worked on donning of underwear.      Cognition   Behavior During Therapy: WFL for tasks assessed/performed Overall Cognitive Status: Within Functional Limits for tasks assessed                Pertinent Vitals/ Pain       Pain Assessment: Faces Faces Pain Scale: Hurts little more Pain Location: right arm with movement Pain Descriptors / Indicators: Grimacing Pain Intervention(s): Monitored during session;Repositioned         Frequency Min 4X/week     Progress Toward Goals  OT Goals(current goals can now be found in the care plan section)  Progress towards OT goals: Progressing toward goals     Plan Discharge plan remains appropriate    End of Session Equipment Utilized During Treatment: Other (comment) (sling)   Activity Tolerance Patient tolerated treatment well   Patient Left in chair;with call bell/phone within reach    Time: 1350-1414 OT Time Calculation (min): 24 min  Charges: OT General Charges $OT Visit: 1 Procedure OT Treatments $Self Care/Home Management : 23-37 mins  Rebekah Zackery , MS, OTR/L, CLT Pager: [phone]  05/14/2015, 2:28 PM

## 2015-05-15 ENCOUNTER — Inpatient Hospital Stay (HOSPITAL_COMMUNITY)
Admission: EM | Admit: 2015-05-15 | Discharge: 2015-05-17 | DRG: 948 | Disposition: A | Discharge status: Skilled Nursing Facility | Payer: No Typology Code available for payment source | Attending: Family Medicine | Admitting: Family Medicine

## 2015-05-15 ENCOUNTER — Encounter (HOSPITAL_COMMUNITY): Payer: Self-pay | Admitting: Vascular Surgery

## 2015-05-15 DIAGNOSIS — W19XXXA Unspecified fall, initial encounter: Secondary | ICD-10-CM | POA: Diagnosis present

## 2015-05-15 DIAGNOSIS — I10 Essential (primary) hypertension: Secondary | ICD-10-CM | POA: Diagnosis present

## 2015-05-15 DIAGNOSIS — R531 Weakness: Secondary | ICD-10-CM | POA: Diagnosis not present

## 2015-05-15 DIAGNOSIS — N289 Disorder of kidney and ureter, unspecified: Secondary | ICD-10-CM | POA: Diagnosis present

## 2015-05-15 DIAGNOSIS — L8990 Pressure ulcer of unspecified site, unspecified stage: Secondary | ICD-10-CM | POA: Insufficient documentation

## 2015-05-15 DIAGNOSIS — Z7982 Long term (current) use of aspirin: Secondary | ICD-10-CM

## 2015-05-15 DIAGNOSIS — D649 Anemia, unspecified: Secondary | ICD-10-CM | POA: Diagnosis present

## 2015-05-15 DIAGNOSIS — Y92009 Unspecified place in unspecified non-institutional (private) residence as the place of occurrence of the external cause: Secondary | ICD-10-CM

## 2015-05-15 DIAGNOSIS — Z794 Long term (current) use of insulin: Secondary | ICD-10-CM

## 2015-05-15 DIAGNOSIS — M009 Pyogenic arthritis, unspecified: Secondary | ICD-10-CM | POA: Diagnosis present

## 2015-05-15 DIAGNOSIS — E119 Type 2 diabetes mellitus without complications: Secondary | ICD-10-CM | POA: Diagnosis present

## 2015-05-15 DIAGNOSIS — J45909 Unspecified asthma, uncomplicated: Secondary | ICD-10-CM | POA: Diagnosis present

## 2015-05-15 HISTORY — DX: Cellulitis of right upper limb: L03.113

## 2015-05-15 HISTORY — DX: Acute kidney failure, unspecified: N17.9

## 2015-05-15 LAB — CBC WITH DIFFERENTIAL/PLATELET
Basophils Absolute: 0.1 10*3/uL (ref 0.0–0.1)
Basophils Relative: 1 % (ref 0–1)
EOS PCT: 2 % (ref 0–5)
Eosinophils Absolute: 0.1 10*3/uL (ref 0.0–0.7)
HCT: 27.7 % — ABNORMAL LOW (ref 36.0–46.0)
HEMOGLOBIN: 8.5 g/dL — AB (ref 12.0–15.0)
LYMPHS ABS: 1.6 10*3/uL (ref 0.7–4.0)
Lymphocytes Relative: 21 % (ref 12–46)
MCH: 25 pg — AB (ref 26.0–34.0)
MCHC: 30.7 g/dL (ref 30.0–36.0)
MCV: 81.5 fL (ref 78.0–100.0)
MONO ABS: 0.7 10*3/uL (ref 0.1–1.0)
Monocytes Relative: 9 % (ref 3–12)
Neutro Abs: 5.1 10*3/uL (ref 1.7–7.7)
Neutrophils Relative %: 67 % (ref 43–77)
Platelets: 346 10*3/uL (ref 150–400)
RBC: 3.4 MIL/uL — AB (ref 3.87–5.11)
RDW: 16.7 % — ABNORMAL HIGH (ref 11.5–15.5)
WBC: 7.5 10*3/uL (ref 4.0–10.5)

## 2015-05-15 LAB — ANAEROBIC CULTURE

## 2015-05-15 LAB — COMPREHENSIVE METABOLIC PANEL
ALBUMIN: 2 g/dL — AB (ref 3.5–5.0)
ALK PHOS: 103 U/L (ref 38–126)
ALT: 7 U/L — ABNORMAL LOW (ref 14–54)
AST: 13 U/L — ABNORMAL LOW (ref 15–41)
Anion gap: 9 (ref 5–15)
BILIRUBIN TOTAL: 0.1 mg/dL — AB (ref 0.3–1.2)
BUN: 13 mg/dL (ref 6–20)
CALCIUM: 8.8 mg/dL — AB (ref 8.9–10.3)
CHLORIDE: 109 mmol/L (ref 101–111)
CO2: 24 mmol/L (ref 22–32)
Creatinine, Ser: 1.65 mg/dL — ABNORMAL HIGH (ref 0.44–1.00)
GFR calc Af Amer: 46 mL/min — ABNORMAL LOW (ref >60)
GFR calc non Af Amer: 40 mL/min — ABNORMAL LOW (ref >60)
Glucose, Bld: 186 mg/dL — ABNORMAL HIGH (ref 65–99)
POTASSIUM: 4.3 mmol/L (ref 3.5–5.1)
Sodium: 142 mmol/L (ref 135–145)
Total Protein: 7 g/dL (ref 6.5–8.1)

## 2015-05-15 LAB — GLUCOSE, CAPILLARY: GLUCOSE-CAPILLARY: 164 mg/dL — AB (ref 65–99)

## 2015-05-15 MED ORDER — VITAMIN D (ERGOCALCIFEROL) 1.25 MG (50000 UNIT) PO CAPS: 50000.0000 [IU] | ORAL_CAPSULE | ORAL | Status: DC

## 2015-05-15 MED ORDER — INSULIN ASPART 100 UNIT/ML ~~LOC~~ SOLN: 0.0000 [IU] | Freq: Three times a day (TID) | SUBCUTANEOUS | Status: DC

## 2015-05-15 MED ORDER — SODIUM CHLORIDE 0.9 % IV SOLN
1.0000 g | INTRAVENOUS | Status: DC
  Administered 2015-05-16 (×2): 1 g via INTRAVENOUS
  Filled 2015-05-15 (×3): qty 1

## 2015-05-15 MED ORDER — INSULIN ASPART 100 UNIT/ML FLEXPEN: 10.0000 [IU] | PEN_INJECTOR | Freq: Three times a day (TID) | SUBCUTANEOUS | Status: DC

## 2015-05-15 MED ORDER — INSULIN ASPART 100 UNIT/ML ~~LOC~~ SOLN
10.0000 [IU] | Freq: Three times a day (TID) | SUBCUTANEOUS | Status: DC
  Administered 2015-05-16 (×2): 10 [IU] via SUBCUTANEOUS

## 2015-05-15 MED ORDER — ASPIRIN EC 81 MG PO TBEC
81.0000 mg | DELAYED_RELEASE_TABLET | Freq: Every day | ORAL | Status: DC
  Administered 2015-05-16 – 2015-05-17 (×2): 81 mg via ORAL
  Filled 2015-05-15 (×2): qty 1

## 2015-05-15 MED ORDER — INSULIN GLARGINE 100 UNITS/ML SOLOSTAR PEN: 65.0000 [IU] | PEN_INJECTOR | Freq: Every day | SUBCUTANEOUS | Status: DC

## 2015-05-15 MED ORDER — ENOXAPARIN SODIUM 30 MG/0.3ML ~~LOC~~ SOLN
30.0000 mg | SUBCUTANEOUS | Status: DC
  Filled 2015-05-15: qty 0.3

## 2015-05-15 MED ORDER — HYDROMORPHONE HCL 1 MG/ML IJ SOLN
1.0000 mg | INTRAMUSCULAR | Status: AC
  Administered 2015-05-15: 1 mg via INTRAVENOUS
  Filled 2015-05-15: qty 1

## 2015-05-15 MED ORDER — INSULIN ASPART 100 UNIT/ML ~~LOC~~ SOLN: 0.0000 [IU] | Freq: Every day | SUBCUTANEOUS | Status: DC

## 2015-05-15 MED ORDER — OXYCODONE-ACETAMINOPHEN 5-325 MG PO TABS
1.0000 | ORAL_TABLET | ORAL | Status: DC | PRN
Start: 2015-05-15 — End: 2015-05-16

## 2015-05-15 MED ORDER — SODIUM CHLORIDE 0.9 % IV SOLN
INTRAVENOUS | Status: AC
  Administered 2015-05-16: 01:00:00 via INTRAVENOUS

## 2015-05-15 MED ORDER — GABAPENTIN 100 MG PO CAPS
100.0000 mg | ORAL_CAPSULE | Freq: Two times a day (BID) | ORAL | Status: DC
  Administered 2015-05-16 – 2015-05-17 (×4): 100 mg via ORAL
  Filled 2015-05-15 (×5): qty 1

## 2015-05-15 MED ORDER — FERROUS SULFATE 325 (65 FE) MG PO TABS
325.0000 mg | ORAL_TABLET | Freq: Two times a day (BID) | ORAL | Status: DC
  Administered 2015-05-16 – 2015-05-17 (×3): 325 mg via ORAL
  Filled 2015-05-15 (×5): qty 1

## 2015-05-15 MED ORDER — ACETAMINOPHEN 325 MG PO TABS
650.0000 mg | ORAL_TABLET | Freq: Four times a day (QID) | ORAL | Status: DC | PRN
Start: 2015-05-15 — End: 2015-05-17

## 2015-05-15 MED ORDER — INSULIN GLARGINE 100 UNIT/ML ~~LOC~~ SOLN
65.0000 [IU] | Freq: Every day | SUBCUTANEOUS | Status: DC
  Administered 2015-05-16 (×2): 65 [IU] via SUBCUTANEOUS
  Filled 2015-05-15 (×3): qty 0.65

## 2015-05-15 MED ORDER — ACETAMINOPHEN 650 MG RE SUPP: 650.0000 mg | Freq: Four times a day (QID) | RECTAL | Status: DC | PRN

## 2015-05-15 NOTE — Clinical Social Work Note (Signed)
Clinical Social Work Assessment  Patient Details  Name: Lindsay [name] MRN: [id number] Date of Birth: [birth year]  Date of referral:  05/15/15               Reason for consult:  Facility Placement                Permission sought to share information with:  Oceanographer granted to share information::  Yes, Verbal Permission Granted  Name::        Agency::     Relationship::     Contact Information:     Housing/Transportation Living arrangements for the past 2 months:  Single Family Home Source of Information:  Patient Patient Interpreter Needed:  None Criminal Activity/Legal Involvement Pertinent to Current Situation/Hospitalization:  No - Comment as needed Significant Relationships:  Other Family Members, Church, Friend Lives with:  Minor Children (ptis child currently staying with church friend) Do you feel safe going back to the place where you live?  No Need for family participation in patient care:  No (Coment)  Care giving concerns: Pt recently d/c home with [facility] services in place for IV ABX. Per database, pt refused placement throughout her last admission. Pt unable to care for self and [facility] d/c'ed services as pt was unsafe with mobility and had secured insufficient caregivers.  Pt encouraged by her [facility] RN to call EMS for transport to [facility].  Social Worker assessment / plan: Situation discussed with CSW leadership, as well as Wellsite geologist.  Pt will need to be placed in an LOG bed for continuation of her IV ABX.  Per medical director, [facility] a/o [facility] may be only options.  CSW discussed with patient and she is agreeable to placement at this time, with the understanding that it may [place] of the county.  Employment status:  Disabled (Comment on whether or not currently receiving Disability), Unemployed Insurance information:  Medicaid In [place] PT Recommendations:    Information / Referral to community resources:  Skilled Nursing  Facility  Patient/Family's Response to care: Pt agreeable to SNF placement at this time as she realizes she cannot care for herself at home and she has little support from family/friends.  Patient/Family's Understanding of and Emotional Response to Diagnosis, Current Treatment, and Prognosis: Pt tearful, expresses disappointment that people who said that they would help her at home did not step up.  Although pt wishes she could be at home, she realizes that this is not an option for her at this time.  She understands that she needs to receive her IV ABX on a daily basis in order to get well, and she needs to be somewhere where this treatment will be ensured.  Pt motivated to get well in order to get back home and continue to care for her daughter.  CSW provided emotional support. Emotional Assessment Appearance:  Appears stated age Attitude/Demeanor/Rapport:  Crying Affect (typically observed):  Tearful/Crying, Accepting Orientation:    Alcohol / Substance use:  Not Applicable Psych involvement (Current and /or in the community):  No (Comment)  Discharge Needs  Concerns to be addressed:  Lack of Support, Home Safety Concerns, Discharge Planning Concerns Readmission within the last 30 days:  Yes Current discharge risk:  Lives alone, Dependent with Mobility, Lack of support system Barriers to Discharge:  No SNF bed, Inadequate or no insurance, Unsafe home situation   Linna Caprice, LCSW 05/15/2015, 10:14 PM

## 2015-05-15 NOTE — Progress Notes (Signed)
Familiar with pt from recent admission.  Pt refused SNF placement at d/c and was adamant about returning home with HHC, including HH IV Abx.  [facility] went to see pt today for Abx dosing, deemed her unsafe secondary to her limited mobility, d/c'ed her from their services, and did not administer her IV Abx for today.  [facility] will not be re-admitting pt to their services, and because securing another [facility] company to serve pt is questionablel, pt will probably require admission until SNF LOG bed can be secured.  Discussed with Director and AD of CSW, as well as RNCM in ED.  CSW will discuss placement with pt.

## 2015-05-15 NOTE — ED Notes (Addendum)
Pt is coming from home via [facility] for eval of generalized weakness since she had shoulder surgery on May 10th. She had an abscess removed. This morning she found she was unable to ambulate on her own. She did fall this am but did not hit her head or LOC or sustain any injury from the fall. Home health nurse came and saw her and referrred her here. 12 lead en route showed NSR. Reports chills but unsure if she had a fever. She reports she was just released from the hospital yesterday and reports she was weak when she was d/c but it became much worse. Pt A&Ox4, resp e/u, and skin warm and dry.

## 2015-05-15 NOTE — ED Provider Notes (Signed)
CSN: [id number]     Arrival date & time 05/15/15  1808 History   First MD Initiated Contact with Patient 05/15/15 1828     Chief Complaint  Patient presents with  . Weakness     (Consider location/radiation/quality/duration/timing/severity/associated sxs/prior Treatment) Patient is a 34 y.o. female presenting with weakness. The history is provided by the patient.  Weakness This is a new problem. The current episode started 12 to 24 hours ago. The problem occurs constantly. The problem has been gradually worsening. Pertinent negatives include no chest pain, no abdominal pain, no headaches and no shortness of breath. Nothing aggravates the symptoms. Nothing relieves the symptoms. She has tried nothing for the symptoms. The treatment provided no relief.    Past Medical History  Diagnosis Date  . Hypertension   . Diabetes mellitus   . Asthma   . Obesity    Past Surgical History  Procedure Laterality Date  . Cesarean section    . Tonsillectomy    . Leg surgery    . Irrigation and debridement shoulder Right 04/30/2015    Procedure: IRRIGATION AND DEBRIDEMENT SHOULDER;  Surgeon: Sheral Apley, MD;  Location: [facility] OR;  Service: Orthopedics;  Laterality: Right;  . Shoulder arthroscopy Right 04/30/2015    Procedure: ARTHROSCOPY SHOULDER;  Surgeon: Sheral Apley, MD;  Location: [facility] OR;  Service: Orthopedics;  Laterality: Right;  . Irrigation and debridement shoulder Right 05/01/2015   Family History  Problem Relation Age of Onset  . Diabetes Mother   . Hypertension Mother   . Heart disease Mother   . Diabetes Father   . Heart disease Father   . Stroke Maternal Grandmother   . Cancer Maternal Grandmother    History  Substance Use Topics  . Smoking status: Never Smoker   . Smokeless tobacco: Never Used  . Alcohol Use: Yes     Comment: socially   OB History    No data available     Review of Systems  Constitutional: Negative for fever and fatigue.  HENT: Negative for  congestion and drooling.   Eyes: Negative for pain.  Respiratory: Negative for cough and shortness of breath.   Cardiovascular: Negative for chest pain.  Gastrointestinal: Negative for nausea, vomiting, abdominal pain and diarrhea.  Genitourinary: Negative for dysuria and hematuria.  Musculoskeletal: Negative for back pain, gait problem and neck pain.  Skin: Negative for color change.  Neurological: Positive for weakness. Negative for dizziness and headaches.  Hematological: Negative for adenopathy.  Psychiatric/Behavioral: Negative for behavioral problems.  All other systems reviewed and are negative.     Allergies  Review of patient's allergies indicates no known allergies.  Home Medications   Prior to Admission medications   Medication Sig Start Date End Date Taking? Authorizing Provider  aspirin 81 MG tablet Take 81 mg by mouth daily.    Historical Provider, MD  ertapenem 1 g in sodium chloride 0.9 % 50 mL Inject 1 g into the vein daily. 05/13/15   Zannie Cove, MD  ferrous sulfate 325 (65 FE) MG tablet Take 1 tablet (325 mg total) by mouth 2 (two) times daily with a meal. 05/13/15   Zannie Cove, MD  gabapentin (NEURONTIN) 100 MG capsule Take 1 capsule (100 mg total) by mouth 2 (two) times daily. 05/14/15   Zannie Cove, MD  insulin aspart (NOVOLOG) 100 UNIT/ML FlexPen Inject 10 Units into the skin 3 (three) times daily with meals. 05/14/15   Zannie Cove, MD  insulin glargine (LANTUS) 100 unit/mL SOPN  Inject 0.65 mLs (65 Units total) into the skin at bedtime. 02/13/15   Doris Cheadle, MD  oxyCODONE-acetaminophen (PERCOCET) 5-325 MG per tablet Take 1-2 tablets by mouth every 4 (four) hours as needed for moderate pain. 05/14/15   Zannie Cove, MD  polyethylene glycol (MIRALAX / GLYCOLAX) packet Take 17 g by mouth daily. 05/14/15   Zannie Cove, MD  pregabalin (LYRICA) 50 MG capsule Take 1 capsule (50 mg total) by mouth 2 (two) times daily. 05/13/15   Zannie Cove, MD  UNABLE  TO FIND This note is to excuse Ms.Blanck from work due to medical illness requiring hospitalization from 04/30/15 through 05/14/15 05/14/15   Zannie Cove, MD  Vitamin D, Ergocalciferol, (DRISDOL) 50000 UNITS CAPS capsule Take 1 capsule (50,000 Units total) by mouth every 7 (seven) days. 07/18/14   Doris Cheadle, MD   BP 152/83 mmHg  Pulse 86  Temp(Src) 98.8 F (37.1 C) (Oral)  Resp 16  SpO2 100%  LMP 04/23/2015 Physical Exam  Constitutional: She is oriented to person, place, and time. She appears well-developed and well-nourished.  HENT:  Head: Normocephalic and atraumatic.  Mouth/Throat: Oropharynx is clear and moist. No oropharyngeal exudate.  Eyes: Conjunctivae and EOM are normal. Pupils are equal, round, and reactive to light.  Neck: Normal range of motion. Neck supple.  Cardiovascular: Normal rate, regular rhythm, normal heart sounds and intact distal pulses.  Exam reveals no gallop and no friction rub.   No murmur heard. Pulmonary/Chest: Effort normal and breath sounds normal. No respiratory distress. She has no wheezes.  Catheter in left upper chest without evidence of infection.  Abdominal: Soft. Bowel sounds are normal. There is no tenderness. There is no rebound and no guarding.  Musculoskeletal: Normal range of motion. She exhibits tenderness. She exhibits no edema.  Healing surgical wounds to the right anterior shoulder with mild purulent drainage but no significant surrounding erythema.  Neurological: She is alert and oriented to person, place, and time.  alert, oriented x3 speech: normal in context and clarity memory: intact grossly cranial nerves II-XII: intact motor strength: 5/5 in LUE, not tested in RUE, 2/5 in LLE, 3/5 in RLE  sensation: intact to light touch diffusely  cerebellar: finger-to-nose intact in LUE   Skin: Skin is warm and dry.  Psychiatric: She has a normal mood and affect. Her behavior is normal.  Nursing note and vitals reviewed.   ED Course   Procedures (including critical care time) Labs Review Labs Reviewed  CBC WITH DIFFERENTIAL/PLATELET - Abnormal; Notable for the following:    RBC 3.40 (*)    Hemoglobin 8.5 (*)    HCT 27.7 (*)    MCH 25.0 (*)    RDW 16.7 (*)    All other components within normal limits  COMPREHENSIVE METABOLIC PANEL - Abnormal; Notable for the following:    Glucose, Bld 186 (*)    Creatinine, Ser 1.65 (*)    Calcium 8.8 (*)    Albumin 2.0 (*)    AST 13 (*)    ALT 7 (*)    Total Bilirubin 0.1 (*)    GFR calc non Af Amer 40 (*)    GFR calc Af Amer 46 (*)    All other components within normal limits  GLUCOSE, CAPILLARY - Abnormal; Notable for the following:    Glucose-Capillary 164 (*)    All other components within normal limits  URINALYSIS, ROUTINE W REFLEX MICROSCOPIC (NOT AT [facility])  CBC WITH DIFFERENTIAL/PLATELET  COMPREHENSIVE METABOLIC PANEL  SEDIMENTATION RATE  POC URINE PREG, ED    Imaging Review No results found.   EKG Interpretation None      MDM   Final diagnoses:  Generalized weakness    7:01 PM 35 y.o. female  w hx of HTN, DM, Asthma obesity s/p I/D of Cellulitis/Abscess of right shoulder and chest wall abscess/myositis who presents with generalized weakness. The patient was discharged from the hospital yesterday. She states that she has had worsening generalized weakness and although she can ambulate some she is having difficulty getting around her house. She had IV EMS help her up last night. She states that she fell onto her bottom this morning but denies hitting her head or loss of consciousness. She complains of continued right shoulder pain. She denies any fevers. Vital signs unremarkable here. We'll get screening lab work and pain control.  I had care management see the patient. They recommend admission as there are no beds available in local assisted living facilities at this time.     Purvis Sheffield, MD 05/16/15 [phone]

## 2015-05-15 NOTE — Clinical Social Work Placement (Signed)
   CLINICAL SOCIAL WORK PLACEMENT  NOTE  Date:  05/15/2015  Patient Details  Name: TANIQUA PETTINGER MRN: [id number] Date of Birth: 1981/03/22  Clinical Social Work is seeking post-discharge placement for this patient at the Skilled  Nursing Facility level of care (*CSW will initial, date and re-position this form in  chart as items are completed):  No   Patient/family provided with [facility] Clinical Social Work Department's list of facilities offering this level of care within the geographic area requested by the patient (or if unable, by the patient's family).  Yes   Patient/family informed of their freedom to choose among providers that offer the needed level of care, that participate in Medicare, Medicaid or managed care program needed by the patient, have an available bed and are willing to accept the patient.  Yes   Patient/family informed of [facility]'s ownership interest in [facility] and [facility], as well as of the fact that they are under no obligation to receive care at these facilities.  PASRR submitted to EDS on 05/08/15     PASRR number received on 05/08/15     Existing PASRR number confirmed on 05/15/15     FL2 transmitted to all facilities in geographic area requested by pt/family on  (FL2 sent to [facility] and [facility].)     FL2 transmitted to all facilities within larger geographic area on       Patient informed that his/her managed care company has contracts with or will negotiate with certain facilities, including the following:            Patient/family informed of bed offers received.  Patient chooses bed at  (Pt will be a SNF LOG.  [facility] or [facility] may be options.)     Physician recommends and patient chooses bed at      Patient to be transferred to   on  .  Patient to be transferred to facility by       Patient family notified on   of transfer.  Name of family member notified:        PHYSICIAN       Additional  Comment:    _______________________________________________ Linna Caprice, LCSW 05/15/2015, 10:34 PM

## 2015-05-15 NOTE — Care Management Note (Signed)
Case Management Note  Patient Details  Name: NIAMH PINELA MRN: [id number] Date of Birth: [birth year]  Subjective/Objective:    Patient presented to [facility] ED s/p fall after being discharged yesterday from hospital on home with [facility] on IV antibiotioc therapy for septic rt. shoulder.  Patient states, she lives alone and realized that she was unable to care for self. Patient is agreeable to SNF placement             Action/Plan:  SNF placement: IV Antibiotic Therapy, SW consult placed, and placement initiatied  Expected Discharge Date:                  Expected Discharge Plan:   SNF placement  In-House Referral:     Discharge planning Services     Post Acute Care Choice:    Choice offered to:     DME Arranged:    DME Agency:     HH Arranged:    HH Agency:     Status of Service:     Medicare Important Message Given:    Date Medicare IM Given:    Medicare IM give by:    Date Additional Medicare IM Given:    Additional Medicare Important Message give by:     If discussed at Long Length of Stay Meetings, dates discussed:    Additional Comments: ED CM received consult from Dr. Romeo Apple EDP regarding patient disposition. Patient was discharged home yesterday with [facility] on IV Antibitoic therapy. Patient stated, that when [facility] RN arrived at the home the assessment was made that patient in need of higher level of care, and they will not be able to provide services at this level. [facility] was called and patient was brought to the ED. Spoke with Medical Advisor Dr. Jacky Kindle, recommendation is for patient to be admitted pending place if patient is agreeable with plan spoke with patient, she is agreeable. Jody CSW and ED CM at patient's bedside she agrees with plan of care. Updated Dr. Romeo Apple on disposition plan. Jody CSW  Initiated placement process. Unit CSW  will continue to follow for final disposition plan.    Michel Bickers, RN 05/15/2015, 10:16 PM

## 2015-05-16 ENCOUNTER — Encounter (HOSPITAL_COMMUNITY): Payer: Self-pay | Admitting: General Practice

## 2015-05-16 DIAGNOSIS — W19XXXA Unspecified fall, initial encounter: Secondary | ICD-10-CM | POA: Diagnosis present

## 2015-05-16 DIAGNOSIS — I10 Essential (primary) hypertension: Secondary | ICD-10-CM | POA: Diagnosis not present

## 2015-05-16 DIAGNOSIS — D649 Anemia, unspecified: Secondary | ICD-10-CM | POA: Diagnosis present

## 2015-05-16 DIAGNOSIS — N289 Disorder of kidney and ureter, unspecified: Secondary | ICD-10-CM | POA: Diagnosis present

## 2015-05-16 DIAGNOSIS — J45909 Unspecified asthma, uncomplicated: Secondary | ICD-10-CM | POA: Diagnosis present

## 2015-05-16 DIAGNOSIS — Y92009 Unspecified place in unspecified non-institutional (private) residence as the place of occurrence of the external cause: Secondary | ICD-10-CM | POA: Diagnosis not present

## 2015-05-16 DIAGNOSIS — Z794 Long term (current) use of insulin: Secondary | ICD-10-CM | POA: Diagnosis not present

## 2015-05-16 DIAGNOSIS — M009 Pyogenic arthritis, unspecified: Secondary | ICD-10-CM | POA: Diagnosis not present

## 2015-05-16 DIAGNOSIS — R531 Weakness: Principal | ICD-10-CM

## 2015-05-16 DIAGNOSIS — E119 Type 2 diabetes mellitus without complications: Secondary | ICD-10-CM | POA: Diagnosis present

## 2015-05-16 DIAGNOSIS — Z7982 Long term (current) use of aspirin: Secondary | ICD-10-CM | POA: Diagnosis not present

## 2015-05-16 LAB — COMPREHENSIVE METABOLIC PANEL
ALBUMIN: 2 g/dL — AB (ref 3.5–5.0)
ALT: 7 U/L — ABNORMAL LOW (ref 14–54)
ANION GAP: 8 (ref 5–15)
AST: 11 U/L — ABNORMAL LOW (ref 15–41)
Alkaline Phosphatase: 93 U/L (ref 38–126)
BUN: 11 mg/dL (ref 6–20)
CHLORIDE: 111 mmol/L (ref 101–111)
CO2: 24 mmol/L (ref 22–32)
Calcium: 8.8 mg/dL — ABNORMAL LOW (ref 8.9–10.3)
Creatinine, Ser: 1.54 mg/dL — ABNORMAL HIGH (ref 0.44–1.00)
GFR calc Af Amer: 50 mL/min — ABNORMAL LOW (ref >60)
GFR, EST NON AFRICAN AMERICAN: 43 mL/min — AB (ref >60)
GLUCOSE: 153 mg/dL — AB (ref 65–99)
POTASSIUM: 4 mmol/L (ref 3.5–5.1)
Sodium: 143 mmol/L (ref 135–145)
TOTAL PROTEIN: 6.5 g/dL (ref 6.5–8.1)
Total Bilirubin: 0.3 mg/dL (ref 0.3–1.2)

## 2015-05-16 LAB — GLUCOSE, CAPILLARY
GLUCOSE-CAPILLARY: 111 mg/dL — AB (ref 65–99)
Glucose-Capillary: 78 mg/dL (ref 65–99)
Glucose-Capillary: 78 mg/dL (ref 65–99)
Glucose-Capillary: 95 mg/dL (ref 65–99)

## 2015-05-16 LAB — CBC WITH DIFFERENTIAL/PLATELET
Basophils Absolute: 0 10*3/uL (ref 0.0–0.1)
Basophils Relative: 1 % (ref 0–1)
Eosinophils Absolute: 0.1 10*3/uL (ref 0.0–0.7)
Eosinophils Relative: 2 % (ref 0–5)
HCT: 27.4 % — ABNORMAL LOW (ref 36.0–46.0)
Hemoglobin: 8.2 g/dL — ABNORMAL LOW (ref 12.0–15.0)
Lymphocytes Relative: 30 % (ref 12–46)
Lymphs Abs: 2 10*3/uL (ref 0.7–4.0)
MCH: 24.6 pg — ABNORMAL LOW (ref 26.0–34.0)
MCHC: 29.9 g/dL — ABNORMAL LOW (ref 30.0–36.0)
MCV: 82.3 fL (ref 78.0–100.0)
Monocytes Absolute: 0.4 10*3/uL (ref 0.1–1.0)
Monocytes Relative: 7 % (ref 3–12)
Neutro Abs: 4 10*3/uL (ref 1.7–7.7)
Neutrophils Relative %: 60 % (ref 43–77)
PLATELETS: 296 10*3/uL (ref 150–400)
RBC: 3.33 MIL/uL — AB (ref 3.87–5.11)
RDW: 16.9 % — AB (ref 11.5–15.5)
WBC: 6.6 10*3/uL (ref 4.0–10.5)

## 2015-05-16 LAB — PREGNANCY, URINE: Preg Test, Ur: NEGATIVE

## 2015-05-16 LAB — URINALYSIS, ROUTINE W REFLEX MICROSCOPIC
BILIRUBIN URINE: NEGATIVE
GLUCOSE, UA: NEGATIVE mg/dL
HGB URINE DIPSTICK: NEGATIVE
KETONES UR: NEGATIVE mg/dL
Nitrite: NEGATIVE
PH: 7 (ref 5.0–8.0)
Protein, ur: NEGATIVE mg/dL
Specific Gravity, Urine: 1.007 (ref 1.005–1.030)
Urobilinogen, UA: 0.2 mg/dL (ref 0.0–1.0)

## 2015-05-16 LAB — URINE MICROSCOPIC-ADD ON

## 2015-05-16 LAB — SEDIMENTATION RATE: Sed Rate: 123 mm/hr — ABNORMAL HIGH (ref 0–22)

## 2015-05-16 MED ORDER — OXYCODONE-ACETAMINOPHEN 5-325 MG PO TABS
1.0000 | ORAL_TABLET | ORAL | Status: DC | PRN
  Administered 2015-05-16 (×4): 2 via ORAL
  Filled 2015-05-16 (×4): qty 2

## 2015-05-16 MED ORDER — ENOXAPARIN SODIUM 80 MG/0.8ML ~~LOC~~ SOLN
80.0000 mg | SUBCUTANEOUS | Status: DC
  Administered 2015-05-16: 80 mg via SUBCUTANEOUS
  Filled 2015-05-16 (×2): qty 0.8

## 2015-05-16 MED ORDER — HYDRALAZINE HCL 20 MG/ML IJ SOLN
10.0000 mg | Freq: Four times a day (QID) | INTRAMUSCULAR | Status: DC | PRN
  Filled 2015-05-16: qty 1

## 2015-05-16 NOTE — Progress Notes (Signed)
Notified Dr Cena Benton that consult notes from ortho and surgery were available to review to determine if patient appropriate for discharge. No orders at this time.

## 2015-05-16 NOTE — Progress Notes (Signed)
Patient seen and evaluated earlier the same by my associate. Please refer to his H&P for details regarding assessment and plan.  Patient was evaluated and was in no acute distress. Band-Aid was removed and shoulder was looked at of which at incision there was purulent discharge. Consulted orthopedic surgeon for reassessment to see if patient will require more than just anti-biotics.  Will reassess next am.  Penny Pia

## 2015-05-16 NOTE — Progress Notes (Signed)
Utilization Review completed. Gianna Calef RN BSN CM 

## 2015-05-16 NOTE — Progress Notes (Signed)
Report received. Pt adm to rm 07 from ED via stretcher, a/o x3, obese, lungs cl bilat lobes. Skin irritation under bilat breast, bilat groin, stg 1 decub to left lateral lower back, foam intact. Drsg to RUE w/sling intact, ext elevated. Pt up to BR indep w/assist. Pt oriented to unit, rm, and call light. Plan of care initiated, voiced understanding.

## 2015-05-16 NOTE — Consult Note (Signed)
ORTHOPAEDIC CONSULTATION  REQUESTING PHYSICIAN: Velvet Bathe, MD  Chief Complaint: generalized weakness  HPI: Lindsay [name] is a 34 y.o. female who complains of generalized weakness.  She called EMS yesterday after falling at home.  She was unable to get up from the floor after the fall.  She has a history of recent R shoulder/chest wall abscess that was taken to the OR for washout 5/10.  General surgery was consulted due to the infection tracking into the chest.  They packed the area in the OR and were following the patient post-op.  In the ED yesterday, incisions were evaluated and found to be draining purulent fluid.  We were consulted to re-evaluated the shoulder for possible repeat washout.    Past Medical History  Diagnosis Date  . Hypertension   . Asthma   . Obesity   . Cellulitis of right upper extremity   . ARF (acute renal failure) 04/2015  . Diabetes mellitus     insulin dependent   Past Surgical History  Procedure Laterality Date  . Cesarean section    . Tonsillectomy    . Leg surgery    . Irrigation and debridement shoulder Right 04/30/2015    Procedure: IRRIGATION AND DEBRIDEMENT SHOULDER;  Surgeon: Renette Butters, MD;  Location: [place];  Service: Orthopedics;  Laterality: Right;  . Shoulder arthroscopy Right 04/30/2015    Procedure: ARTHROSCOPY SHOULDER;  Surgeon: Renette Butters, MD;  Location: [place];  Service: Orthopedics;  Laterality: Right;  . Irrigation and debridement shoulder Right 05/01/2015   History   Social History  . Marital Status: Single    Spouse Name: N/A  . Number of Children: N/A  . Years of Education: N/A   Social History Main Topics  . Smoking status: Never Smoker   . Smokeless tobacco: Never Used  . Alcohol Use: Yes     Comment: socially  . Drug Use: No  . Sexual Activity: Not on file   Other Topics Concern  . None   Social History Narrative   Family History  Problem Relation Age of Onset  . Diabetes Mother   .  Hypertension Mother   . Heart disease Mother   . Diabetes Father   . Heart disease Father   . Stroke Maternal Grandmother   . Cancer Maternal Grandmother    No Known Allergies Prior to Admission medications   Medication Sig Start Date End Date Taking? Authorizing Provider  ferrous sulfate 325 (65 FE) MG tablet Take 1 tablet (325 mg total) by mouth 2 (two) times daily with a meal. 05/13/15  Yes Domenic Polite, MD  gabapentin (NEURONTIN) 100 MG capsule Take 1 capsule (100 mg total) by mouth 2 (two) times daily. 05/14/15  Yes Domenic Polite, MD  insulin aspart (NOVOLOG) 100 UNIT/ML FlexPen Inject 10 Units into the skin 3 (three) times daily with meals. 05/14/15  Yes Domenic Polite, MD  insulin glargine (LANTUS) 100 unit/mL SOPN Inject 0.65 mLs (65 Units total) into the skin at bedtime. 02/13/15  Yes Lorayne Marek, MD  oxyCODONE-acetaminophen (PERCOCET) 5-325 MG per tablet Take 1-2 tablets by mouth every 4 (four) hours as needed for moderate pain. 05/14/15  Yes Domenic Polite, MD  polyethylene glycol (MIRALAX / GLYCOLAX) packet Take 17 g by mouth daily. 05/14/15  Yes Domenic Polite, MD  pregabalin (LYRICA) 50 MG capsule Take 1 capsule (50 mg total) by mouth 2 (two) times daily. 05/13/15  Yes Domenic Polite, MD  Vitamin D, Ergocalciferol, (DRISDOL) 50000  UNITS CAPS capsule Take 1 capsule (50,000 Units total) by mouth every 7 (seven) days. 07/18/14  Yes Lorayne Marek, MD  aspirin 81 MG tablet Take 81 mg by mouth daily.    Historical Provider, MD   No results found.  Positive ROS: All other systems have been reviewed and were otherwise negative with the exception of those mentioned in the HPI and as above.  Labs cbc  Recent Labs  05/15/15 1933 05/16/15 0558  WBC 7.5 6.6  HGB 8.5* 8.2*  HCT 27.7* 27.4*  PLT 346 296    Labs inflam No results for input(s): CRP in the last 72 hours.  Invalid input(s): ESR  Labs coag No results for input(s): INR, PTT in the last 72 hours.  Invalid input(s):  PT   Recent Labs  05/15/15 1933 05/16/15 0558  NA 142 143  K 4.3 4.0  CL 109 111  CO2 24 24  GLUCOSE 186* 153*  BUN 13 11  CREATININE 1.65* 1.54*  CALCIUM 8.8* 8.8*    Physical Exam: Filed Vitals:   05/16/15 0539  BP: 135/78  Pulse: 94  Temp: 98.3 F (36.8 C)  Resp: 17   General: Alert, no acute distress Cardiovascular: No pedal edema Respiratory: No cyanosis, no use of accessory musculature GI: No organomegaly, abdomen is soft and non-tender Skin: No lesions in the area of chief complaint other than those listed below in MSK exam.  Neurologic: Sensation intact distally Psychiatric: Patient is competent for consent with normal mood and affect Lymphatic: No axillary or cervical lymphadenopathy  MUSCULOSKELETAL:  No erythema or warmth of the right shoulder. Chest incisions actively draining purulent fluid.  Tenderness/firmness over the chest wall incision tracking medially.  Very mild pain with gentle ROM of the shoulder.  Other extremities are atraumatic with painless ROM and NVI.  Assessment: Right shoulder/chest wall abscess  Plan: Will discuss the case with general surgery team, since they were monitoring the chest wall incisions.  Due to very mild pain with motion of the shoulder joint and no drainage from shoulder portal incisions, we are hopeful that the infection has not retract into the shoulder joint. Patient will likely need repeat washout of the area tomorrow. Will have the patient be NPO after midnight. Weight Bearing Status: WBAT in the RUE, sling for comfort, ok to work ROM as tolerated PT VTE px: SCD's and chem prophylaxis on hold today due to possible OR tomorrow 5/27   Gae Dry, PA-C Cell [phone]   05/16/2015 1:59 PM

## 2015-05-16 NOTE — H&P (Signed)
Aleisha D Mantell is an 34 y.o. female.    Chief Complaint: septic arthritis HPI: 34 yo female with htn, dm2, w recent admission for septic joint apparently was discharged home and had fall and could not take care of herself with limited social support.  ED requests that pt be admitted for placement due to septic joint. Pt denies fever, chills, cp, palp, sob, lower ext edema. Pt denies any new pain.   Past Medical History  Diagnosis Date  . Hypertension   . Diabetes mellitus   . Asthma   . Obesity     Past Surgical History  Procedure Laterality Date  . Cesarean section    . Tonsillectomy    . Leg surgery    . Irrigation and debridement shoulder Right 04/30/2015    Procedure: IRRIGATION AND DEBRIDEMENT SHOULDER;  Surgeon: Renette Butters, MD;  Location: [place];  Service: Orthopedics;  Laterality: Right;  . Shoulder arthroscopy Right 04/30/2015    Procedure: ARTHROSCOPY SHOULDER;  Surgeon: Renette Butters, MD;  Location: [place];  Service: Orthopedics;  Laterality: Right;  . Irrigation and debridement shoulder Right 05/01/2015    Family History  Problem Relation Age of Onset  . Diabetes Mother   . Hypertension Mother   . Heart disease Mother   . Diabetes Father   . Heart disease Father   . Stroke Maternal Grandmother   . Cancer Maternal Grandmother    Social History:  reports that she has never smoked. She has never used smokeless tobacco. She reports that she drinks alcohol. She reports that she does not use illicit drugs.  Allergies: No Known Allergies  Medications Prior to Admission  Medication Sig Dispense Refill  . ferrous sulfate 325 (65 FE) MG tablet Take 1 tablet (325 mg total) by mouth 2 (two) times daily with a meal. 60 tablet 3  . gabapentin (NEURONTIN) 100 MG capsule Take 1 capsule (100 mg total) by mouth 2 (two) times daily. 15 capsule 0  . insulin aspart (NOVOLOG) 100 UNIT/ML FlexPen Inject 10 Units into the skin 3 (three) times daily with meals. 15 mL   . insulin  glargine (LANTUS) 100 unit/mL SOPN Inject 0.65 mLs (65 Units total) into the skin at bedtime. 15 pen 3  . oxyCODONE-acetaminophen (PERCOCET) 5-325 MG per tablet Take 1-2 tablets by mouth every 4 (four) hours as needed for moderate pain. 30 tablet 0  . polyethylene glycol (MIRALAX / GLYCOLAX) packet Take 17 g by mouth daily. 14 each 0  . pregabalin (LYRICA) 50 MG capsule Take 1 capsule (50 mg total) by mouth 2 (two) times daily. 60 capsule 0  . Vitamin D, Ergocalciferol, (DRISDOL) 50000 UNITS CAPS capsule Take 1 capsule (50,000 Units total) by mouth every 7 (seven) days. 12 capsule 0  . aspirin 81 MG tablet Take 81 mg by mouth daily.      Results for orders placed or performed during the hospital encounter of 05/15/15 (from the past 48 hour(s))  CBC with Differential/Platelet     Status: Abnormal   Collection Time: 05/15/15  7:33 PM  Result Value Ref Range   WBC 7.5 4.0 - 10.5 K/uL   RBC 3.40 (L) 3.87 - 5.11 MIL/uL   Hemoglobin 8.5 (L) 12.0 - 15.0 g/dL   HCT 27.7 (L) 36.0 - 46.0 %   MCV 81.5 78.0 - 100.0 fL   MCH 25.0 (L) 26.0 - 34.0 pg   MCHC 30.7 30.0 - 36.0 g/dL   RDW 16.7 (H) 11.5 - 15.5 %  Platelets 346 150 - 400 K/uL   Neutrophils Relative % 67 43 - 77 %   Neutro Abs 5.1 1.7 - 7.7 K/uL   Lymphocytes Relative 21 12 - 46 %   Lymphs Abs 1.6 0.7 - 4.0 K/uL   Monocytes Relative 9 3 - 12 %   Monocytes Absolute 0.7 0.1 - 1.0 K/uL   Eosinophils Relative 2 0 - 5 %   Eosinophils Absolute 0.1 0.0 - 0.7 K/uL   Basophils Relative 1 0 - 1 %   Basophils Absolute 0.1 0.0 - 0.1 K/uL  Comprehensive metabolic panel     Status: Abnormal   Collection Time: 05/15/15  7:33 PM  Result Value Ref Range   Sodium 142 135 - 145 mmol/L   Potassium 4.3 3.5 - 5.1 mmol/L   Chloride 109 101 - 111 mmol/L   CO2 24 22 - 32 mmol/L   Glucose, Bld 186 (H) 65 - 99 mg/dL   BUN 13 6 - 20 mg/dL   Creatinine, Ser 1.65 (H) 0.44 - 1.00 mg/dL   Calcium 8.8 (L) 8.9 - 10.3 mg/dL   Total Protein 7.0 6.5 - 8.1 g/dL    Albumin 2.0 (L) 3.5 - 5.0 g/dL   AST 13 (L) 15 - 41 U/L   ALT 7 (L) 14 - 54 U/L   Alkaline Phosphatase 103 38 - 126 U/L   Total Bilirubin 0.1 (L) 0.3 - 1.2 mg/dL   GFR calc non Af Amer 40 (L) >60 mL/min   GFR calc Af Amer 46 (L) >60 mL/min    Comment: (NOTE) The eGFR has been calculated using the CKD EPI equation. This calculation has not been validated in all clinical situations. eGFR's persistently <60 mL/min signify possible Chronic Kidney Disease.    Anion gap 9 5 - 15  Glucose, capillary     Status: Abnormal   Collection Time: 05/15/15  9:57 PM  Result Value Ref Range   Glucose-Capillary 164 (H) 65 - 99 mg/dL   Comment 1 Notify RN    No results found.  Review of Systems  Constitutional: Negative.   HENT: Negative.   Eyes: Negative.   Respiratory: Negative.   Cardiovascular: Negative.   Gastrointestinal: Negative.   Genitourinary: Negative.   Musculoskeletal: Positive for joint pain. Negative for myalgias, back pain, falls and neck pain.  Skin: Negative.   Neurological: Negative.   Endo/Heme/Allergies: Negative.   Psychiatric/Behavioral: Negative.     Blood pressure 171/96, pulse 84, temperature 98.3 F (36.8 C), temperature source Oral, resp. rate 17, height 5' 0.96" (1.548 m), weight 168.693 kg (371 lb 14.4 oz), last menstrual period 04/23/2015, SpO2 100 %. Physical Exam  Constitutional: She is oriented to person, place, and time. She appears well-developed and well-nourished.  HENT:  Head: Normocephalic and atraumatic.  Eyes: Conjunctivae and EOM are normal. Pupils are equal, round, and reactive to light. No scleral icterus.  Neck: Normal range of motion. Neck supple. No JVD present. No tracheal deviation present. No thyromegaly present.  Cardiovascular: Normal rate and regular rhythm.  Exam reveals no gallop and no friction rub.   No murmur heard. Respiratory: Effort normal and breath sounds normal. No respiratory distress. She has no wheezes. She has no rales.   GI: Soft. Bowel sounds are normal. She exhibits no distension. There is no tenderness. There is no rebound and no guarding.  Musculoskeletal: She exhibits no edema or tenderness.  R arm , shoulder, with bandage over it,  Pt doesn't want me to remove and  look  Lymphadenopathy:    She has no cervical adenopathy.  Neurological: She is alert and oriented to person, place, and time. She has normal reflexes. She displays normal reflexes. No cranial nerve deficit. She exhibits normal muscle tone. Coordination normal.  Skin: Skin is warm and dry. No rash noted. No erythema. No pallor.  Psychiatric: She has a normal mood and affect. Her behavior is normal. Judgment and thought content normal.     Assessment/Plan Septic right shoulder Pt is unable to take care of herself at home requesting placement to SNF.  Cont iv abx.   Hypertension Cont current medications Add hydralazine 42m iv q6h prn sbp >160  Dm2 fsbs ac and qhs, iss  Anemia Check cbc in am  Renal insufficiency Check cmp in am  DVT prophylaxis:  Scd, lovenox   Rexine Gowens 05/16/2015, 12:15 AM

## 2015-05-16 NOTE — Consult Note (Addendum)
WOC wound consult note Consult requested for shoulder wound.  This is a recent post-op incision which has been previously followed by the otrrtho service and is suspected of possible sepsis at this time.  This is beyond [facility] scope of practice; please consult ortho service for further assessment and plan of care.  Reason for Consult: WOC assistance requested for use of Interdry.  Discussed patient via phone with bedside nurse.  They describe skin folds as being red, macerated, and moist with partial thickness skin loss.  Appearance is consistent with intertrigo.  Interdry silver-impregnated fabric ordered for use by bedside nurses and instructions provided.  This product should remain in place for 5 days for optimal plan of care to provide antimicrobial benefits and wick moisture away from skin. Please re-consult if further assistance is needed.  Thank-you,  Cammie Mcgee MSN, RN, CWOCN, [place], CNS [phone]

## 2015-05-16 NOTE — Progress Notes (Signed)
Patient ID: Lindsay [name], female   DOB: [birth year], 34 y.o.   MRN: [id number]    Subjective: Pt known to our service as she had a shoulder/chest abscess that was I&D by Dr. Renaye Rakers and Dr. Derrell Lolling on 5-10.  The patient was getting dressing changes to the chest wound.  She was just discharged 2 days ago.  She could not get up from her chair yesterday and called EMS.  She then stood up later and "my ankles gave out on me" and she fell.  EMS got called again to bring her back into the hospital.  She has been readmitted and we have been asked to see her for evaluation of her chest wound.  Objective: Vital signs in last 24 hours: Temp:  [98.3 F (36.8 C)-98.8 F (37.1 C)] 98.5 F (36.9 C) (05/26 1436) Pulse Rate:  [84-97] 85 (05/26 1436) Resp:  [7-29] 18 (05/26 1436) BP: (135-171)/(72-96) 156/83 mmHg (05/26 1436) SpO2:  [94 %-100 %] 94 % (05/26 1436) Weight:  [168.693 kg (371 lb 14.4 oz)] 168.693 kg (371 lb 14.4 oz) (05/25 2150)    Intake/Output from previous day: 05/25 0701 - 05/26 0700 In: 360 [P.O.:360] Out: 600 [Urine:600] Intake/Output this shift: Total I/O In: 240 [P.O.:240] Out: 250 [Urine:250]  PE: Skin: Chest wall wound is clean.  The suture in her chest wall was removed.  Her wound was explored with a Qtip.  No purulent drainage was noted, just minimal serous drainage.  No other areas of fluctuance, erythema, or other signs of infection seen near this incision.    Lab Results:   Recent Labs  05/15/15 1933 05/16/15 0558  WBC 7.5 6.6  HGB 8.5* 8.2*  HCT 27.7* 27.4*  PLT 346 296   BMET  Recent Labs  05/15/15 1933 05/16/15 0558  NA 142 143  K 4.3 4.0  CL 109 111  CO2 24 24  GLUCOSE 186* 153*  BUN 13 11  CREATININE 1.65* 1.54*  CALCIUM 8.8* 8.8*   PT/INR No results for input(s): LABPROT, INR in the last 72 hours. CMP     Component Value Date/Time   NA 143 05/16/2015 0558   K 4.0 05/16/2015 0558   CL 111 05/16/2015 0558   CO2 24 05/16/2015 0558   GLUCOSE 153* 05/16/2015 0558   BUN 11 05/16/2015 0558   CREATININE 1.54* 05/16/2015 0558   CREATININE 0.60 02/13/2015 1607   CALCIUM 8.8* 05/16/2015 0558   PROT 6.5 05/16/2015 0558   ALBUMIN 2.0* 05/16/2015 0558   AST 11* 05/16/2015 0558   ALT 7* 05/16/2015 0558   ALKPHOS 93 05/16/2015 0558   BILITOT 0.3 05/16/2015 0558   GFRNONAA 43* 05/16/2015 0558   GFRNONAA >89 02/13/2015 1607   GFRAA 50* 05/16/2015 0558   GFRAA >89 02/13/2015 1607   Lipase     Component Value Date/Time   LIPASE 76* 06/10/2011 1807       Studies/Results: No results found.  Anti-infectives: Anti-infectives    Start     Dose/Rate Route Frequency Ordered Stop   05/15/15 2300  ertapenem (INVANZ) 1 g in sodium chloride 0.9 % 50 mL IVPB     1 g 100 mL/hr over 30 Minutes Intravenous Every 24 hours 05/15/15 2233         Assessment/Plan  POD 16, s/p incision and drainage of chest wall abscess --Dr. Derrell Lolling with Dr. Renaye Rakers -her chest wall incision is healing well and is not infected.  There is no surrounding evidence of infection on  her chest wall.  She is AF with a normal WBC.   -can attempt to pack her wound with just a wick as it is almost healed.  Otherwise, do not see anything else right now to do surgically.  LOS: 0 days    Zeek Rostron E 05/16/2015, 2:41 PM Pager: [phone]

## 2015-05-16 NOTE — Clinical Social Work Note (Signed)
Patient has a bed at [facility] and is agreeable to going to this facility. Authorization has been received from [facility].   Roddie Mc MSW, [place], [place], [phone]

## 2015-05-17 DIAGNOSIS — L8990 Pressure ulcer of unspecified site, unspecified stage: Secondary | ICD-10-CM | POA: Insufficient documentation

## 2015-05-17 LAB — GLUCOSE, CAPILLARY
GLUCOSE-CAPILLARY: 78 mg/dL (ref 65–99)
Glucose-Capillary: 119 mg/dL — ABNORMAL HIGH (ref 65–99)

## 2015-05-17 MED ORDER — OXYCODONE-ACETAMINOPHEN 5-325 MG PO TABS: 1.0000 | ORAL_TABLET | ORAL | Status: DC | PRN

## 2015-05-17 MED ORDER — SODIUM CHLORIDE 0.9 % IV SOLN: 1.0000 g | INTRAVENOUS | Status: AC

## 2015-05-17 NOTE — Progress Notes (Signed)
Pt prepared for d/c to SNF. PICC line in place to receive continued IV antibiotics. Skin intact except as most recently charted. Vitals are stable. Report called to receiving facility. Pt to be transported by ambulance service.

## 2015-05-17 NOTE — Discharge Summary (Signed)
Physician Discharge Summary  Lindsay [name] AVW:[id number] DOB: 10-29-1981 DOA: 05/15/2015  PCP: Doris Cheadle, MD  Admit date: 05/15/2015 Discharge date: 05/17/2015  Time spent: >35 minutes  Recommendations for Outpatient Follow-up:  1. Please ensure patient follows up with orthopedic surgeon on discharge.  Discharge Diagnoses:  Active Problems:   HTN (hypertension)   Septic arthritis   Generalized weakness   Weakness   Pressure ulcer   Discharge Condition: stable  Diet recommendation: heart healthy/carb modified  Filed Weights   05/15/15 2150 05/17/15 0500  Weight: 168.693 kg (371 lb 14.4 oz) 169.9 kg (374 lb 9 oz)    History of present illness:  34 y/o female with htn, dm2, w recent admission for septic joint was discharged home and reportedly she could not take care of herself and has limited social support.  Hospital Course:  Right shoulder/chest wall abscess - Orthopedic surgery and general surgery contacted. No surgery found - We'll continue Invanz until June 22  Procedures:  None  Consultations:  Orthopedic surgery: Janalee Dane  General surgery  Discharge Exam: Filed Vitals:   05/17/15 0521  BP: 142/75  Pulse: 84  Temp: 98.4 F (36.9 C)  Resp: 22    General: Patient in no acute distress, alert and awake Cardiovascular: No cyanosis Respiratory: No increased work of breathing, equal chest rise, no audible wheezes  Discharge Instructions   Discharge Instructions    Call MD for:  redness, tenderness, or signs of infection (pain, swelling, redness, odor or green/yellow discharge around incision site)    Complete by:  As directed      Call MD for:  severe uncontrolled pain    Complete by:  As directed      Call MD for:  temperature >100.4    Complete by:  As directed      Diet - low sodium heart healthy    Complete by:  As directed      Discharge instructions    Complete by:  As directed   Recommend patient follow up within the next week  with her orthopaedic surgeon for further evaluation and recommendations.     Increase activity slowly    Complete by:  As directed           Current Discharge Medication List    START taking these medications   Details  ertapenem 1 g in sodium chloride 0.9 % 50 mL Inject 1 g into the vein daily.      CONTINUE these medications which have NOT CHANGED   Details  ferrous sulfate 325 (65 FE) MG tablet Take 1 tablet (325 mg total) by mouth 2 (two) times daily with a meal. Qty: 60 tablet, Refills: 3    gabapentin (NEURONTIN) 100 MG capsule Take 1 capsule (100 mg total) by mouth 2 (two) times daily. Qty: 15 capsule, Refills: 0   Associated Diagnoses: Numbness and tingling    insulin aspart (NOVOLOG) 100 UNIT/ML FlexPen Inject 10 Units into the skin 3 (three) times daily with meals. Qty: 15 mL   Associated Diagnoses: Other specified diabetes mellitus without complications    insulin glargine (LANTUS) 100 unit/mL SOPN Inject 0.65 mLs (65 Units total) into the skin at bedtime. Qty: 15 pen, Refills: 3   Associated Diagnoses: Other specified diabetes mellitus without complications    oxyCODONE-acetaminophen (PERCOCET) 5-325 MG per tablet Take 1-2 tablets by mouth every 4 (four) hours as needed for moderate pain. Qty: 30 tablet, Refills: 0    polyethylene glycol (MIRALAX / GLYCOLAX)  packet Take 17 g by mouth daily. Qty: 14 each, Refills: 0    Vitamin D, Ergocalciferol, (DRISDOL) 50000 UNITS CAPS capsule Take 1 capsule (50,000 Units total) by mouth every 7 (seven) days. Qty: 12 capsule, Refills: 0    aspirin 81 MG tablet Take 81 mg by mouth daily.      STOP taking these medications     pregabalin (LYRICA) 50 MG capsule        No Known Allergies    The results of significant diagnostics from this hospitalization (including imaging, microbiology, ancillary and laboratory) are listed below for reference.    Significant Diagnostic Studies: Dg Shoulder Right  04/19/2015    CLINICAL DATA:  Acute onset of right shoulder pain. Heard right shoulder pop 2 days ago. Initial encounter.  EXAM: RIGHT SHOULDER - 2+ VIEW  COMPARISON:  None.  FINDINGS: There is no evidence of fracture or dislocation. The right humeral head is seated within the glenoid fossa. The acromioclavicular joint is unremarkable in appearance. No significant soft tissue abnormalities are seen. The visualized portions of the right lung are clear.  IMPRESSION: No evidence of fracture or dislocation.   Electronically Signed   By: Roanna Raider M.D.   On: 04/19/2015 01:26   Ct Shoulder Right Wo Contrast  04/27/2015   CLINICAL DATA:  Constant right shoulder pain for 10 days  EXAM: CT OF THE RIGHT SHOULDER WITHOUT CONTRAST  TECHNIQUE: Multidetector CT imaging was performed according to the standard protocol. Multiplanar CT image reconstructions were also generated.  COMPARISON:  Radiographs 04/19/2015  FINDINGS: There is no fracture or dislocation. There is no bone lesion. There is mild osteolysis of the distal clavicle. This can be seen after trauma or with inflammatory arthropathies. It can be idiopathic. No soft tissue mass is evident. There is no abnormal fluid collection. Visible portions of the right ribs and chest wall appear unremarkable.  IMPRESSION: Osteolysis of the distal right clavicle, uncertain chronicity.   Electronically Signed   By: Ellery Plunk M.D.   On: 04/27/2015 03:43   US Renal  05/02/2015   CLINICAL DATA:  Acute renal failure  EXAM: RENAL / URINARY TRACT ULTRASOUND COMPLETE  COMPARISON:  None.  FINDINGS: Right Kidney:  Length: 13.4 cm. Echogenicity within normal limits. No mass or hydronephrosis visualized.  Left Kidney:  Length: 12.8 cm. Echogenicity within normal limits. No mass or hydronephrosis visualized.  Bladder:  Empty and therefore not well evaluated  IMPRESSION: Study limited by body habitus but appears normal.   Electronically Signed   By: Esperanza Heir M.D.   On: 05/02/2015 16:47    Mr Shoulder Right Wo Contrast  04/30/2015   CLINICAL DATA:  Right shoulder pain. Recent history of antibiotic use. Patient was in too much pain to continue with the contrasted portion of the exam.  EXAM: MRI OF THE RIGHT SHOULDER WITHOUT CONTRAST  TECHNIQUE: Multiplanar, multisequence MR imaging of the shoulder was performed. No intravenous contrast was administered.  COMPARISON:  None.  FINDINGS: Rotator cuff: Mild tendinosis of the supraspinatus and infraspinatus tendon. Teres minor tendon is intact. Subscapularis tendon is intact.  Muscles: There is severe muscle edema in the subscapularis muscle with small 5 x 6 x 25 mm fluid collection. There is severe muscle edema in the anterior and posterior deltoid muscles with a 14 mm fluid collection in the deltoid muscle. There is soft tissue edema superficial to the subscapularis and infraspinatus muscles. There is mild muscle edema along the periphery of the teres minor  muscle.  Biceps long head:  Intact.  Acromioclavicular Joint: Mild degenerative change of the acromioclavicular joint. Type II acromion.  Glenohumeral Joint: Large joint effusion.  No chondral defect.  Labrum:  Intact.  Bones: No focal marrow signal abnormality. No fracture or dislocation.  IMPRESSION: 1. Severe muscle edema in the subscapularis muscle with small 5 x 6 x 25 mm fluid collection. There is severe muscle edema in the anterior and posterior deltoid muscles with a 14 mm fluid collection in the deltoid muscle. Soft tissue edema superficial to the subscapularis and infraspinatus muscles. Mild muscle edema along the periphery of the teres minor muscle. The overall appearance is most concerning for myositis which may be secondary to an infectious or inflammatory etiology. The small fluid collections make the process more concerning for an infectious etiology. 2. Large glenohumeral joint effusion. Septic arthritis cannot be excluded.   Electronically Signed   By: Elige Ko   On: 04/30/2015  10:45   Ir US Guide Vasc Access Left  05/04/2015   CLINICAL DATA:  Cellulitis  EXAM: TUNNELED LEFT INTERNAL JUGULAR PICC LINE PLACEMENT WITH ULTRASOUND AND FLUOROSCOPIC GUIDANCE  FLUOROSCOPY TIME:  12 seconds.  PROCEDURE: The patient was advised of the possible risks andcomplications and agreed to undergo the procedure. The patient was then brought to the angiographic suite for the procedure.  The left neck was prepped with chlorhexidine, drapedin the usual sterile fashion using maximum barrier technique (cap and mask, sterile gown, sterile gloves, large sterile sheet, hand hygiene and cutaneous antisepsis) and infiltrated locally with 1% Lidocaine.  Ultrasound demonstrated patency of the left jugular vein, and this was documented with an image. Under real-time ultrasound guidance, this vein was accessed with a 21 gauge micropuncture needle and image documentation was performed. A long tract was utilized to create a tunnel. A 0.018 wire was introduced in to the vein. Over this, a 5 [place] single lumen Power tunneled PICC was advanced to the lower SVC/right atrial junction. The cuff was positioned in the subcutaneous tract. Fluoroscopy during the procedure and fluoro spot radiograph confirms appropriate catheter position. The catheter was flushed and covered with asterile dressing.  Catheter length: 24 cm  COMPLICATIONS: None  IMPRESSION: Successful left jugular tunneled power PICC line placement with ultrasound and fluoroscopic guidance. The catheter is ready for use.   Electronically Signed   By: Jolaine Click M.D.   On: 05/04/2015 08:07   Ir Fluoro Guide Cv Midline Picc Left  05/04/2015   CLINICAL DATA:  Cellulitis  EXAM: TUNNELED LEFT INTERNAL JUGULAR PICC LINE PLACEMENT WITH ULTRASOUND AND FLUOROSCOPIC GUIDANCE  FLUOROSCOPY TIME:  12 seconds.  PROCEDURE: The patient was advised of the possible risks andcomplications and agreed to undergo the procedure. The patient was then brought to the angiographic suite  for the procedure.  The left neck was prepped with chlorhexidine, drapedin the usual sterile fashion using maximum barrier technique (cap and mask, sterile gown, sterile gloves, large sterile sheet, hand hygiene and cutaneous antisepsis) and infiltrated locally with 1% Lidocaine.  Ultrasound demonstrated patency of the left jugular vein, and this was documented with an image. Under real-time ultrasound guidance, this vein was accessed with a 21 gauge micropuncture needle and image documentation was performed. A long tract was utilized to create a tunnel. A 0.018 wire was introduced in to the vein. Over this, a 5 [place] single lumen Power tunneled PICC was advanced to the lower SVC/right atrial junction. The cuff was positioned in the subcutaneous tract. Fluoroscopy during the  procedure and fluoro spot radiograph confirms appropriate catheter position. The catheter was flushed and covered with asterile dressing.  Catheter length: 24 cm  COMPLICATIONS: None  IMPRESSION: Successful left jugular tunneled power PICC line placement with ultrasound and fluoroscopic guidance. The catheter is ready for use.   Electronically Signed   By: Jolaine Click M.D.   On: 05/04/2015 08:07    Microbiology: No results found for this or any previous visit (from the past 240 hour(s)).   Labs: Basic Metabolic Panel:  Recent Labs Lab 05/11/15 0450 05/12/15 0530 05/13/15 0537 05/14/15 0535 05/15/15 1933 05/16/15 0558  NA 140 142 142 142 142 143  K 4.8 4.6 4.8 4.9 4.3 4.0  CL 103 106 108 109 109 111  CO2 27 27 27 25 24 24   GLUCOSE 92 89 94 89 186* 153*  BUN 27* 20 17 16 13 11   CREATININE 2.20* 1.93* 1.89* 1.73* 1.65* 1.54*  CALCIUM 8.7* 8.9 8.7* 9.1 8.8* 8.8*  PHOS 4.7* 5.2* 5.3* 5.4*  --   --    Liver Function Tests:  Recent Labs Lab 05/12/15 0530 05/13/15 0537 05/14/15 0535 05/15/15 1933 05/16/15 0558  AST  --   --   --  13* 11*  ALT  --   --   --  7* 7*  ALKPHOS  --   --   --  103 93  BILITOT  --   --    --  0.1* 0.3  PROT  --   --   --  7.0 6.5  ALBUMIN 1.9* 1.8* 2.0* 2.0* 2.0*   No results for input(s): LIPASE, AMYLASE in the last 168 hours. No results for input(s): AMMONIA in the last 168 hours. CBC:  Recent Labs Lab 05/11/15 0450 05/13/15 0532 05/14/15 0925 05/15/15 1933 05/16/15 0558  WBC 9.6 7.5 7.4 7.5 6.6  NEUTROABS  --   --   --  5.1 4.0  HGB 8.7* 7.8* 8.0* 8.5* 8.2*  HCT 28.3* 26.5* 26.9* 27.7* 27.4*  MCV 82.5 83.1 82.8 81.5 82.3  PLT 499* 396 343 346 296   Cardiac Enzymes: No results for input(s): CKTOTAL, CKMB, CKMBINDEX, TROPONINI in the last 168 hours. BNP: BNP (last 3 results) No results for input(s): BNP in the last 8760 hours.  ProBNP (last 3 results) No results for input(s): PROBNP in the last 8760 hours.  CBG:  Recent Labs Lab 05/16/15 0801 05/16/15 1144 05/16/15 1700 05/16/15 2230 05/17/15 0808  GLUCAP 111* 78 78 95 78       Signed:  Jordy Verba  [facility] 05/17/2015, 11:25 AM

## 2015-05-17 NOTE — Clinical Social Work Placement (Signed)
   CLINICAL SOCIAL WORK PLACEMENT  NOTE  Date:  05/17/2015  Patient Details  Name: Lindsay [name] MRN: [id number] Date of Birth: [birth year]  Clinical Social Work is seeking post-discharge placement for this patient at the Skilled  Nursing Facility level of care (*CSW will initial, date and re-position this form in  chart as items are completed):  No   Patient/family provided with [facility] Clinical Social Work Department's list of facilities offering this level of care within the geographic area requested by the patient (or if unable, by the patient's family).  Yes   Patient/family informed of their freedom to choose among providers that offer the needed level of care, that participate in Medicare, Medicaid or managed care program needed by the patient, have an available bed and are willing to accept the patient.  Yes   Patient/family informed of [facility]'s ownership interest in [facility] and [facility], as well as of the fact that they are under no obligation to receive care at these facilities.  PASRR submitted to EDS on 05/08/15     PASRR number received on 05/08/15     Existing PASRR number confirmed on 05/15/15     FL2 transmitted to all facilities in geographic area requested by pt/family on 05/17/15 [facility] sent to [facility] and [facility].)     FL2 transmitted to all facilities within larger geographic area on       Patient informed that his/her managed care company has contracts with or will negotiate with certain facilities, including the following:        Yes   Patient/family informed of bed offers received.  Patient chooses bed at [facility] (Pt will be a SNF LOG.  [facility] or [facility] may be options.)     Physician recommends and patient chooses bed at      Patient to be transferred to [facility] on 05/17/15.  Patient to be transferred to facility by [facility]     Patient family notified on 05/17/15 of  transfer.  Name of family member notified:  Patient notifying family.     PHYSICIAN       Additional Comment:  Per MD patient ready for DC to [facility]. RN, patient, patient's family, and facility notified of DC. RN given number for report. DC packet on chart. Ambulance transport requested for patient. CSW signing off.   _______________________________________________ Venita Lick, LCSW 05/17/2015, 1:47 PM

## 2015-05-21 ENCOUNTER — Non-Acute Institutional Stay (SKILLED_NURSING_FACILITY): Payer: No Typology Code available for payment source | Admitting: Internal Medicine

## 2015-05-21 ENCOUNTER — Encounter: Payer: Self-pay | Admitting: Internal Medicine

## 2015-05-21 DIAGNOSIS — I10 Essential (primary) hypertension: Secondary | ICD-10-CM

## 2015-05-21 DIAGNOSIS — E089 Diabetes mellitus due to underlying condition without complications: Secondary | ICD-10-CM | POA: Diagnosis not present

## 2015-05-21 DIAGNOSIS — M01X0 Direct infection of unspecified joint in infectious and parasitic diseases classified elsewhere: Secondary | ICD-10-CM | POA: Diagnosis not present

## 2015-05-21 DIAGNOSIS — B373 Candidiasis of vulva and vagina: Secondary | ICD-10-CM

## 2015-05-21 DIAGNOSIS — L8990 Pressure ulcer of unspecified site, unspecified stage: Secondary | ICD-10-CM

## 2015-05-21 DIAGNOSIS — Z794 Long term (current) use of insulin: Secondary | ICD-10-CM

## 2015-05-21 DIAGNOSIS — A498 Other bacterial infections of unspecified site: Secondary | ICD-10-CM | POA: Diagnosis not present

## 2015-05-21 DIAGNOSIS — M0080 Arthritis due to other bacteria, unspecified joint: Secondary | ICD-10-CM

## 2015-05-21 DIAGNOSIS — A480 Gas gangrene: Secondary | ICD-10-CM

## 2015-05-21 DIAGNOSIS — B3731 Acute candidiasis of vulva and vagina: Secondary | ICD-10-CM

## 2015-05-21 NOTE — Progress Notes (Signed)
Patient ID: KAISEY INCLAN, female   DOB: 12/20/1981, 34 y.o.   MRN: [id number]    HISTORY AND PHYSICAL   DATE: 05/21/15  Location:  [facility]    Place of Service: SNF [phone])   Extended Emergency Contact Information Primary Emergency Contact: Hinton,Ruth Address: [address] [facility] [address] [facility]          [place], [place] [address] [place] Home Phone: [phone] Relation: Grandmother  Advanced Directive information  FULL CODE  Chief Complaint  Patient presents with  . New Admit To SNF    HPI:  34 yo female seen today as a new admission into SNF following hospital stay for right shoulder septic joint s/p I&D. She has a left IJ PICC line and is receiving ertapenem IV through June 23rd. She has a right gluteal pressure ulcer. She is on insulin for DM. She would like the dosing schedule changed for lantus and take 1/2 in AM and 1/2 in PM. CBGs 150-210s. No low BS reactions. She does not have family support and a church member is keeping her 66 yo daughter while she is in rehab.   She c/o vaginal d/c since beginning abx and was given vaginal insert in hospital for yeast infection. She would also like a powder to place in skin folds to reduce sweating and itching.  BP controlled with diet.  Pain stable on percocet  Past Medical History  Diagnosis Date  . Hypertension   . Asthma   . Obesity   . Cellulitis of right upper extremity   . ARF (acute renal failure) 04/2015  . Diabetes mellitus     insulin dependent    Past Surgical History  Procedure Laterality Date  . Cesarean section    . Tonsillectomy    . Leg surgery    . Irrigation and debridement shoulder Right 04/30/2015    Procedure: IRRIGATION AND DEBRIDEMENT SHOULDER;  Surgeon: Sheral Apley, MD;  Location: [facility] OR;  Service: Orthopedics;  Laterality: Right;  . Shoulder arthroscopy Right 04/30/2015    Procedure: ARTHROSCOPY SHOULDER;  Surgeon: Sheral Apley, MD;  Location: [facility] OR;  Service:  Orthopedics;  Laterality: Right;  . Irrigation and debridement shoulder Right 05/01/2015    Patient Care Team: Doris Cheadle, MD as PCP - General (Internal Medicine)  History   Social History  . Marital Status: Single    Spouse Name: N/A  . Number of Children: N/A  . Years of Education: N/A   Occupational History  . Not on file.   Social History Main Topics  . Smoking status: Never Smoker   . Smokeless tobacco: Never Used  . Alcohol Use: Yes     Comment: socially  . Drug Use: No  . Sexual Activity: Not on file   Other Topics Concern  . Not on file   Social History Narrative     reports that she has never smoked. She has never used smokeless tobacco. She reports that she drinks alcohol. She reports that she does not use illicit drugs.  Family History  Problem Relation Age of Onset  . Diabetes Mother   . Hypertension Mother   . Heart disease Mother   . Diabetes Father   . Heart disease Father   . Stroke Maternal Grandmother   . Cancer Maternal Grandmother    No family status information on file.    Immunization History  Administered Date(s) Administered  . Pneumococcal Polysaccharide-23 02/13/2015    No Known Allergies  Medications: Patient's  Medications  New Prescriptions   No medications on file  Previous Medications   ASPIRIN 81 MG TABLET    Take 81 mg by mouth daily.   ERTAPENEM 1 G IN SODIUM CHLORIDE 0.9 % 50 ML    Inject 1 g into the vein daily.   FERROUS SULFATE 325 (65 FE) MG TABLET    Take 1 tablet (325 mg total) by mouth 2 (two) times daily with a meal.   GABAPENTIN (NEURONTIN) 100 MG CAPSULE    Take 1 capsule (100 mg total) by mouth 2 (two) times daily.   INSULIN ASPART (NOVOLOG) 100 UNIT/ML FLEXPEN    Inject 10 Units into the skin 3 (three) times daily with meals.   INSULIN GLARGINE (LANTUS) 100 UNIT/ML SOPN    Inject 0.65 mLs (65 Units total) into the skin at bedtime.   OXYCODONE-ACETAMINOPHEN (PERCOCET) 5-325 MG PER TABLET    Take 1-2  tablets by mouth every 4 (four) hours as needed for moderate pain.   POLYETHYLENE GLYCOL (MIRALAX / GLYCOLAX) PACKET    Take 17 g by mouth daily.   VITAMIN D, ERGOCALCIFEROL, (DRISDOL) 50000 UNITS CAPS CAPSULE    Take 1 capsule (50,000 Units total) by mouth every 7 (seven) days.  Modified Medications   No medications on file  Discontinued Medications   No medications on file    Review of Systems  Constitutional: Negative for fever, chills, diaphoresis, activity change, appetite change and fatigue.  HENT: Negative for ear pain and sore throat.   Eyes: Negative for visual disturbance.  Respiratory: Negative for cough, chest tightness and shortness of breath.   Cardiovascular: Negative for chest pain, palpitations and leg swelling.  Gastrointestinal: Negative for nausea, vomiting, abdominal pain, diarrhea, constipation and blood in stool.  Genitourinary: Positive for vaginal discharge. Negative for dysuria.  Musculoskeletal: Positive for joint swelling and arthralgias.  Neurological: Negative for dizziness, tremors, numbness and headaches.  Psychiatric/Behavioral: Negative for sleep disturbance. The patient is not nervous/anxious.     Filed Vitals:   05/21/15 1728  BP: 97/53  Pulse: 93  Temp: 97.3 F (36.3 C)  Weight: 359 lb (162.841 kg)   Body mass index is 67.96 kg/(m^2).  Physical Exam  Constitutional: She is oriented to person, place, and time. She appears well-developed and well-nourished.  HENT:  Mouth/Throat: Oropharynx is clear and moist. No oropharyngeal exudate.  Eyes: Pupils are equal, round, and reactive to light. No scleral icterus.  Neck: Neck supple. Carotid bruit is not present. No tracheal deviation present. No thyromegaly present.  Cardiovascular: Normal rate, regular rhythm, normal heart sounds and intact distal pulses.  Exam reveals no gallop and no friction rub.   No murmur heard. +1 pitting LE edema b/l. No calf TTP. Left IJ PICC intact with no redness or  d/c at insertion site  Pulmonary/Chest: Effort normal and breath sounds normal. No stridor. No respiratory distress. She has no wheezes. She has no rales.  Abdominal: Soft. Bowel sounds are normal. She exhibits no distension and no mass. There is no hepatomegaly. There is no tenderness. There is no rebound and no guarding.  Musculoskeletal: She exhibits edema (RUE swelling and TTP).  Lymphadenopathy:    She has no cervical adenopathy.  Neurological: She is alert and oriented to person, place, and time.  Skin: Skin is warm and dry. No rash noted.  Psychiatric: She has a normal mood and affect. Her behavior is normal. Thought content normal.     Labs reviewed: Admission on 05/15/2015, Discharged on 05/17/2015  Component Date Value Ref Range Status  . WBC 05/15/2015 7.5  4.0 - 10.5 K/uL Final  . RBC 05/15/2015 3.40* 3.87 - 5.11 MIL/uL Final  . Hemoglobin 05/15/2015 8.5* 12.0 - 15.0 g/dL Final  . HCT 16/09/9603 27.7* 36.0 - 46.0 % Final  . MCV 05/15/2015 81.5  78.0 - 100.0 fL Final  . MCH 05/15/2015 25.0* 26.0 - 34.0 pg Final  . MCHC 05/15/2015 30.7  30.0 - 36.0 g/dL Final  . RDW 54/08/8118 16.7* 11.5 - 15.5 % Final  . Platelets 05/15/2015 346  150 - 400 K/uL Final  . Neutrophils Relative % 05/15/2015 67  43 - 77 % Final  . Neutro Abs 05/15/2015 5.1  1.7 - 7.7 K/uL Final  . Lymphocytes Relative 05/15/2015 21  12 - 46 % Final  . Lymphs Abs 05/15/2015 1.6  0.7 - 4.0 K/uL Final  . Monocytes Relative 05/15/2015 9  3 - 12 % Final  . Monocytes Absolute 05/15/2015 0.7  0.1 - 1.0 K/uL Final  . Eosinophils Relative 05/15/2015 2  0 - 5 % Final  . Eosinophils Absolute 05/15/2015 0.1  0.0 - 0.7 K/uL Final  . Basophils Relative 05/15/2015 1  0 - 1 % Final  . Basophils Absolute 05/15/2015 0.1  0.0 - 0.1 K/uL Final  . Sodium 05/15/2015 142  135 - 145 mmol/L Final  . Potassium 05/15/2015 4.3  3.5 - 5.1 mmol/L Final  . Chloride 05/15/2015 109  101 - 111 mmol/L Final  . CO2 05/15/2015 24  22 - 32  mmol/L Final  . Glucose, Bld 05/15/2015 186* 65 - 99 mg/dL Final  . BUN 14/78/2956 13  6 - 20 mg/dL Final  . Creatinine, Ser 05/15/2015 1.65* 0.44 - 1.00 mg/dL Final  . Calcium 21/30/8657 8.8* 8.9 - 10.3 mg/dL Final  . Total Protein 05/15/2015 7.0  6.5 - 8.1 g/dL Final  . Albumin 84/69/6295 2.0* 3.5 - 5.0 g/dL Final  . AST 28/41/3244 13* 15 - 41 U/L Final  . ALT 05/15/2015 7* 14 - 54 U/L Final  . Alkaline Phosphatase 05/15/2015 103  38 - 126 U/L Final  . Total Bilirubin 05/15/2015 0.1* 0.3 - 1.2 mg/dL Final  . GFR calc non Af Amer 05/15/2015 40* >60 mL/min Final  . GFR calc Af Amer 05/15/2015 46* >60 mL/min Final   Comment: (NOTE) The eGFR has been calculated using the CKD EPI equation. This calculation has not been validated in all clinical situations. eGFR's persistently <60 mL/min signify possible Chronic Kidney Disease.   . Anion gap 05/15/2015 9  5 - 15 Final  . Color, Urine 05/16/2015 GREEN* YELLOW Final   BIOCHEMICALS MAY BE AFFECTED BY COLOR  . APPearance 05/16/2015 CLEAR  CLEAR Final  . Specific Gravity, Urine 05/16/2015 1.007  1.005 - 1.030 Final  . pH 05/16/2015 7.0  5.0 - 8.0 Final  . Glucose, UA 05/16/2015 NEGATIVE  NEGATIVE mg/dL Final  . Hgb urine dipstick 05/16/2015 NEGATIVE  NEGATIVE Final  . Bilirubin Urine 05/16/2015 NEGATIVE  NEGATIVE Final  . Ketones, ur 05/16/2015 NEGATIVE  NEGATIVE mg/dL Final  . Protein, ur 12/23/7251 NEGATIVE  NEGATIVE mg/dL Final  . Urobilinogen, UA 05/16/2015 0.2  0.0 - 1.0 mg/dL Final  . Nitrite 66/44/0347 NEGATIVE  NEGATIVE Final  . Leukocytes, UA 05/16/2015 TRACE* NEGATIVE Final  . Glucose-Capillary 05/15/2015 164* 65 - 99 mg/dL Final  . Comment 1 42/59/5638 Notify RN   Final  . WBC 05/16/2015 6.6  4.0 - 10.5 K/uL Final  . RBC 05/16/2015  3.33* 3.87 - 5.11 MIL/uL Final  . Hemoglobin 05/16/2015 8.2* 12.0 - 15.0 g/dL Final  . HCT 16/09/9603 27.4* 36.0 - 46.0 % Final  . MCV 05/16/2015 82.3  78.0 - 100.0 fL Final  . MCH 05/16/2015  24.6* 26.0 - 34.0 pg Final  . MCHC 05/16/2015 29.9* 30.0 - 36.0 g/dL Final  . RDW 54/08/8118 16.9* 11.5 - 15.5 % Final  . Platelets 05/16/2015 296  150 - 400 K/uL Final  . Neutrophils Relative % 05/16/2015 60  43 - 77 % Final  . Neutro Abs 05/16/2015 4.0  1.7 - 7.7 K/uL Final  . Lymphocytes Relative 05/16/2015 30  12 - 46 % Final  . Lymphs Abs 05/16/2015 2.0  0.7 - 4.0 K/uL Final  . Monocytes Relative 05/16/2015 7  3 - 12 % Final  . Monocytes Absolute 05/16/2015 0.4  0.1 - 1.0 K/uL Final  . Eosinophils Relative 05/16/2015 2  0 - 5 % Final  . Eosinophils Absolute 05/16/2015 0.1  0.0 - 0.7 K/uL Final  . Basophils Relative 05/16/2015 1  0 - 1 % Final  . Basophils Absolute 05/16/2015 0.0  0.0 - 0.1 K/uL Final  . Sodium 05/16/2015 143  135 - 145 mmol/L Final  . Potassium 05/16/2015 4.0  3.5 - 5.1 mmol/L Final  . Chloride 05/16/2015 111  101 - 111 mmol/L Final  . CO2 05/16/2015 24  22 - 32 mmol/L Final  . Glucose, Bld 05/16/2015 153* 65 - 99 mg/dL Final  . BUN 14/78/2956 11  6 - 20 mg/dL Final  . Creatinine, Ser 05/16/2015 1.54* 0.44 - 1.00 mg/dL Final  . Calcium 21/30/8657 8.8* 8.9 - 10.3 mg/dL Final  . Total Protein 05/16/2015 6.5  6.5 - 8.1 g/dL Final  . Albumin 84/69/6295 2.0* 3.5 - 5.0 g/dL Final  . AST 28/41/3244 11* 15 - 41 U/L Final  . ALT 05/16/2015 7* 14 - 54 U/L Final  . Alkaline Phosphatase 05/16/2015 93  38 - 126 U/L Final  . Total Bilirubin 05/16/2015 0.3  0.3 - 1.2 mg/dL Final  . GFR calc non Af Amer 05/16/2015 43* >60 mL/min Final  . GFR calc Af Amer 05/16/2015 50* >60 mL/min Final   Comment: (NOTE) The eGFR has been calculated using the CKD EPI equation. This calculation has not been validated in all clinical situations. eGFR's persistently <60 mL/min signify possible Chronic Kidney Disease.   . Anion gap 05/16/2015 8  5 - 15 Final  . Sed Rate 05/16/2015 123* 0 - 22 mm/hr Final  . Preg Test, Ur 05/16/2015 NEGATIVE  NEGATIVE Final   Comment:        THE SENSITIVITY OF  THIS METHODOLOGY IS >20 mIU/mL.   Marland Kitchen Glucose-Capillary 05/16/2015 111* 65 - 99 mg/dL Final  . Squamous Epithelial / LPF 05/16/2015 MANY* RARE Final  . WBC, UA 05/16/2015 3-6  <3 WBC/hpf Final  . RBC / HPF 05/16/2015 0-2  <3 RBC/hpf Final  . Bacteria, UA 05/16/2015 RARE  RARE Final  . Urine-Other 05/16/2015 FEW YEAST   Final  . Glucose-Capillary 05/16/2015 78  65 - 99 mg/dL Final  . Glucose-Capillary 05/16/2015 78  65 - 99 mg/dL Final  . Glucose-Capillary 05/16/2015 95  65 - 99 mg/dL Final  . Glucose-Capillary 05/17/2015 78  65 - 99 mg/dL Final  . Glucose-Capillary 05/17/2015 119* 65 - 99 mg/dL Final  Admission on 12/23/7251, Discharged on 05/14/2015  No results displayed because visit has over 200 results.    Admission on 04/27/2015, Discharged on  04/27/2015  Component Date Value Ref Range Status  . Sodium 04/27/2015 126* 135 - 145 mmol/L Final  . Potassium 04/27/2015 4.8  3.5 - 5.1 mmol/L Final   SPECIMEN HEMOLYZED. HEMOLYSIS MAY AFFECT INTEGRITY OF RESULTS.  Marland Kitchen Chloride 04/27/2015 92* 101 - 111 mmol/L Final  . CO2 04/27/2015 21* 22 - 32 mmol/L Final  . Glucose, Bld 04/27/2015 432* 70 - 99 mg/dL Final  . BUN 78/29/5621 9  6 - 20 mg/dL Final  . Creatinine, Ser 04/27/2015 0.76  0.44 - 1.00 mg/dL Final  . Calcium 30/86/5784 8.9  8.9 - 10.3 mg/dL Final  . GFR calc non Af Amer 04/27/2015 >60  >60 mL/min Final  . GFR calc Af Amer 04/27/2015 >60  >60 mL/min Final   Comment: (NOTE) The eGFR has been calculated using the CKD EPI equation. This calculation has not been validated in all clinical situations. eGFR's persistently <60 mL/min signify possible Chronic Kidney Disease.   . Anion gap 04/27/2015 13  5 - 15 Final  . WBC 04/27/2015 13.6* 4.0 - 10.5 K/uL Final  . RBC 04/27/2015 4.62  3.87 - 5.11 MIL/uL Final  . Hemoglobin 04/27/2015 11.5* 12.0 - 15.0 g/dL Final  . HCT 69/62/9528 34.2* 36.0 - 46.0 % Final  . MCV 04/27/2015 74.0* 78.0 - 100.0 fL Final  . MCH 04/27/2015 24.9* 26.0 -  34.0 pg Final  . MCHC 04/27/2015 33.6  30.0 - 36.0 g/dL Final  . RDW 41/32/4401 13.9  11.5 - 15.5 % Final  . Platelets 04/27/2015 377  150 - 400 K/uL Final  . Neutrophils Relative % 04/27/2015 77  43 - 77 % Final  . Lymphocytes Relative 04/27/2015 14  12 - 46 % Final  . Monocytes Relative 04/27/2015 8  3 - 12 % Final  . Eosinophils Relative 04/27/2015 1  0 - 5 % Final  . Basophils Relative 04/27/2015 0  0 - 1 % Final  . Neutro Abs 04/27/2015 10.5* 1.7 - 7.7 K/uL Final  . Lymphs Abs 04/27/2015 1.9  0.7 - 4.0 K/uL Final  . Monocytes Absolute 04/27/2015 1.1* 0.1 - 1.0 K/uL Final  . Eosinophils Absolute 04/27/2015 0.1  0.0 - 0.7 K/uL Final  . Basophils Absolute 04/27/2015 0.0  0.0 - 0.1 K/uL Final  . Smear Review 04/27/2015 MORPHOLOGY UNREMARKABLE   Final  . Sed Rate 04/27/2015 128* 0 - 22 mm/hr Final  . Specimen Description 04/27/2015 ABSCESS RIGHT SHOULDER   Final  . Special Requests 04/27/2015 NONE   Final  . Gram Stain 04/27/2015    Final                   Value:FEW WBC PRESENT,BOTH PMN AND MONONUCLEAR RARE GRAM NEGATIVE RODS Performed at [facility]   . Culture 04/27/2015    Final                   Value:MODERATE PREVOTELLA BIVIA Note: BETA LACTAMASE POSITIVE Performed at [facility]   . Report Status 04/27/2015 05/11/2015 FINAL   Final    Ct Shoulder Right Wo Contrast  04/27/2015   CLINICAL DATA:  Constant right shoulder pain for 10 days  EXAM: CT OF THE RIGHT SHOULDER WITHOUT CONTRAST  TECHNIQUE: Multidetector CT imaging was performed according to the standard protocol. Multiplanar CT image reconstructions were also generated.  COMPARISON:  Radiographs 04/19/2015  FINDINGS: There is no fracture or dislocation. There is no bone lesion. There is mild osteolysis of the distal clavicle. This can be  seen after trauma or with inflammatory arthropathies. It can be idiopathic. No soft tissue mass is evident. There is no abnormal fluid collection. Visible portions of the  right ribs and chest wall appear unremarkable.  IMPRESSION: Osteolysis of the distal right clavicle, uncertain chronicity.   Electronically Signed   By: Ellery Plunk M.D.   On: 04/27/2015 03:43   US Renal  05/02/2015   CLINICAL DATA:  Acute renal failure  EXAM: RENAL / URINARY TRACT ULTRASOUND COMPLETE  COMPARISON:  None.  FINDINGS: Right Kidney:  Length: 13.4 cm. Echogenicity within normal limits. No mass or hydronephrosis visualized.  Left Kidney:  Length: 12.8 cm. Echogenicity within normal limits. No mass or hydronephrosis visualized.  Bladder:  Empty and therefore not well evaluated  IMPRESSION: Study limited by body habitus but appears normal.   Electronically Signed   By: Esperanza Heir M.D.   On: 05/02/2015 16:47   Mr Shoulder Right Wo Contrast  04/30/2015   CLINICAL DATA:  Right shoulder pain. Recent history of antibiotic use. Patient was in too much pain to continue with the contrasted portion of the exam.  EXAM: MRI OF THE RIGHT SHOULDER WITHOUT CONTRAST  TECHNIQUE: Multiplanar, multisequence MR imaging of the shoulder was performed. No intravenous contrast was administered.  COMPARISON:  None.  FINDINGS: Rotator cuff: Mild tendinosis of the supraspinatus and infraspinatus tendon. Teres minor tendon is intact. Subscapularis tendon is intact.  Muscles: There is severe muscle edema in the subscapularis muscle with small 5 x 6 x 25 mm fluid collection. There is severe muscle edema in the anterior and posterior deltoid muscles with a 14 mm fluid collection in the deltoid muscle. There is soft tissue edema superficial to the subscapularis and infraspinatus muscles. There is mild muscle edema along the periphery of the teres minor muscle.  Biceps long head:  Intact.  Acromioclavicular Joint: Mild degenerative change of the acromioclavicular joint. Type II acromion.  Glenohumeral Joint: Large joint effusion.  No chondral defect.  Labrum:  Intact.  Bones: No focal marrow signal abnormality. No  fracture or dislocation.  IMPRESSION: 1. Severe muscle edema in the subscapularis muscle with small 5 x 6 x 25 mm fluid collection. There is severe muscle edema in the anterior and posterior deltoid muscles with a 14 mm fluid collection in the deltoid muscle. Soft tissue edema superficial to the subscapularis and infraspinatus muscles. Mild muscle edema along the periphery of the teres minor muscle. The overall appearance is most concerning for myositis which may be secondary to an infectious or inflammatory etiology. The small fluid collections make the process more concerning for an infectious etiology. 2. Large glenohumeral joint effusion. Septic arthritis cannot be excluded.   Electronically Signed   By: Elige Ko   On: 04/30/2015 10:45   Ir US Guide Vasc Access Left  05/04/2015   CLINICAL DATA:  Cellulitis  EXAM: TUNNELED LEFT INTERNAL JUGULAR PICC LINE PLACEMENT WITH ULTRASOUND AND FLUOROSCOPIC GUIDANCE  FLUOROSCOPY TIME:  12 seconds.  PROCEDURE: The patient was advised of the possible risks andcomplications and agreed to undergo the procedure. The patient was then brought to the angiographic suite for the procedure.  The left neck was prepped with chlorhexidine, drapedin the usual sterile fashion using maximum barrier technique (cap and mask, sterile gown, sterile gloves, large sterile sheet, hand hygiene and cutaneous antisepsis) and infiltrated locally with 1% Lidocaine.  Ultrasound demonstrated patency of the left jugular vein, and this was documented with an image. Under real-time ultrasound guidance, this vein was  accessed with a 21 gauge micropuncture needle and image documentation was performed. A long tract was utilized to create a tunnel. A 0.018 wire was introduced in to the vein. Over this, a 5 [place] single lumen Power tunneled PICC was advanced to the lower SVC/right atrial junction. The cuff was positioned in the subcutaneous tract. Fluoroscopy during the procedure and fluoro spot  radiograph confirms appropriate catheter position. The catheter was flushed and covered with asterile dressing.  Catheter length: 24 cm  COMPLICATIONS: None  IMPRESSION: Successful left jugular tunneled power PICC line placement with ultrasound and fluoroscopic guidance. The catheter is ready for use.   Electronically Signed   By: Jolaine Click M.D.   On: 05/04/2015 08:07   Ir Fluoro Guide Cv Midline Picc Left  05/04/2015   CLINICAL DATA:  Cellulitis  EXAM: TUNNELED LEFT INTERNAL JUGULAR PICC LINE PLACEMENT WITH ULTRASOUND AND FLUOROSCOPIC GUIDANCE  FLUOROSCOPY TIME:  12 seconds.  PROCEDURE: The patient was advised of the possible risks andcomplications and agreed to undergo the procedure. The patient was then brought to the angiographic suite for the procedure.  The left neck was prepped with chlorhexidine, drapedin the usual sterile fashion using maximum barrier technique (cap and mask, sterile gown, sterile gloves, large sterile sheet, hand hygiene and cutaneous antisepsis) and infiltrated locally with 1% Lidocaine.  Ultrasound demonstrated patency of the left jugular vein, and this was documented with an image. Under real-time ultrasound guidance, this vein was accessed with a 21 gauge micropuncture needle and image documentation was performed. A long tract was utilized to create a tunnel. A 0.018 wire was introduced in to the vein. Over this, a 5 [place] single lumen Power tunneled PICC was advanced to the lower SVC/right atrial junction. The cuff was positioned in the subcutaneous tract. Fluoroscopy during the procedure and fluoro spot radiograph confirms appropriate catheter position. The catheter was flushed and covered with asterile dressing.  Catheter length: 24 cm  COMPLICATIONS: None  IMPRESSION: Successful left jugular tunneled power PICC line placement with ultrasound and fluoroscopic guidance. The catheter is ready for use.   Electronically Signed   By: Jolaine Click M.D.   On: 05/04/2015 08:07      Assessment/Plan   ICD-9-CM ICD-10-CM   1. Vaginal candidiasis 112.1 B37.3   2. 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 Diabetes mellitus due to underlying condition without complication, with long-term current use of insulin (HCC) with hyperglycemia 249.00 E08.9    V58.67 Z79.4   4. Essential hypertension - stable 401.9 I10   5. 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 Pressure ulcer - right gluteal 707.00 L89.90    707.20      --start diflucan weekly for vaginal candidiasis at 100mg  po due to reduced renal fxn. Continue diflucan through June 30th  --will consider nystatin powder to skin folds daily if no control with po diflucan  --check BMP on 05/29/15  --continue other meds as ordered  --PICC care as indicated  --wound care as indicated  --PT as ordered  --GOAL: short term rehab and d/c home when medically appropriate. Communicated with pt and nursing.  --will follow  Lanika Colgate S. Ancil Linsey  [facility] [address] [place], [place] [address] [phone] Cell (Monday-Friday 8 AM - 5 PM) [phone] After 5 PM and follow prompts

## 2015-06-12 ENCOUNTER — Ambulatory Visit (INDEPENDENT_AMBULATORY_CARE_PROVIDER_SITE_OTHER): Payer: No Typology Code available for payment source | Admitting: Internal Medicine

## 2015-06-12 ENCOUNTER — Encounter: Payer: Self-pay | Admitting: Internal Medicine

## 2015-06-12 VITALS — BP 160/101 | HR 83 | Temp 97.0°F | Ht 68.0 in | Wt 355.0 lb

## 2015-06-12 DIAGNOSIS — M7501 Adhesive capsulitis of right shoulder: Secondary | ICD-10-CM

## 2015-06-12 DIAGNOSIS — M009 Pyogenic arthritis, unspecified: Secondary | ICD-10-CM

## 2015-06-12 DIAGNOSIS — B181 Chronic viral hepatitis B without delta-agent: Secondary | ICD-10-CM

## 2015-06-12 LAB — C-REACTIVE PROTEIN: CRP: 3.1 mg/dL — AB (ref <0.60)

## 2015-06-12 LAB — CBC
HEMATOCRIT: 29.4 % — AB (ref 36.0–46.0)
Hemoglobin: 9.2 g/dL — ABNORMAL LOW (ref 12.0–15.0)
MCH: 24 pg — ABNORMAL LOW (ref 26.0–34.0)
MCHC: 31.3 g/dL (ref 30.0–36.0)
MCV: 76.6 fL — AB (ref 78.0–100.0)
MPV: 9.9 fL (ref 8.6–12.4)
Platelets: 288 10*3/uL (ref 150–400)
RBC: 3.84 MIL/uL — ABNORMAL LOW (ref 3.87–5.11)
RDW: 16.7 % — ABNORMAL HIGH (ref 11.5–15.5)
WBC: 6.8 10*3/uL (ref 4.0–10.5)

## 2015-06-12 LAB — COMPREHENSIVE METABOLIC PANEL
ALBUMIN: 3.5 g/dL (ref 3.5–5.2)
ALT: <8 U/L (ref 0–35)
AST: 12 U/L (ref 0–37)
Alkaline Phosphatase: 63 U/L (ref 39–117)
BUN: 10 mg/dL (ref 6–23)
CALCIUM: 9.1 mg/dL (ref 8.4–10.5)
CO2: 25 mEq/L (ref 19–32)
Chloride: 103 mEq/L (ref 96–112)
Creat: 0.6 mg/dL (ref 0.50–1.10)
GLUCOSE: 146 mg/dL — AB (ref 70–99)
POTASSIUM: 4.2 meq/L (ref 3.5–5.3)
Sodium: 137 mEq/L (ref 135–145)
Total Bilirubin: 0.4 mg/dL (ref 0.2–1.2)
Total Protein: 7.1 g/dL (ref 6.0–8.3)

## 2015-06-12 MED ORDER — CEPHALEXIN 500 MG PO CAPS: 500.0000 mg | ORAL_CAPSULE | Freq: Four times a day (QID) | ORAL | Status: DC

## 2015-06-12 NOTE — Progress Notes (Signed)
Subjective:    Patient ID: Lindsay [name], female    DOB: 1981/06/13, 34 y.o.   MRN: [id number]  HPI 33yo F wit DM, recently hospitalized with right shoulder septic arthritis, with chest wall abscess and myositis, debrided 04/30/15, OR cultures grew P.bivia. Given ceftaroline then discharged on ertapenem to finish 28 day course. She states that she has had some improvement in pain of shoulder but still very limited movement. She works with physical therapy at the SNF and has next appt with dr. Eulah Pont in early July. She states that the wound has healed but still has some slight serous drainage from one spot on surgical incision site. No warmth or erythema but does have pain with movement. She notices that after working with physical therapy, she has muscle tightness the following day.  No Known Allergies Current Outpatient Prescriptions on File Prior to Visit  Medication Sig Dispense Refill  . aspirin 81 MG tablet Take 81 mg by mouth daily.    . ferrous sulfate 325 (65 FE) MG tablet Take 1 tablet (325 mg total) by mouth 2 (two) times daily with a meal. 60 tablet 3  . gabapentin (NEURONTIN) 100 MG capsule Take 1 capsule (100 mg total) by mouth 2 (two) times daily. 15 capsule 0  . insulin aspart (NOVOLOG) 100 UNIT/ML FlexPen Inject 10 Units into the skin 3 (three) times daily with meals. 15 mL   . insulin glargine (LANTUS) 100 unit/mL SOPN Inject 0.65 mLs (65 Units total) into the skin at bedtime. 15 pen 3  . oxyCODONE-acetaminophen (PERCOCET) 5-325 MG per tablet Take 1-2 tablets by mouth every 4 (four) hours as needed for moderate pain. 30 tablet 0  . Vitamin D, Ergocalciferol, (DRISDOL) 50000 UNITS CAPS capsule Take 1 capsule (50,000 Units total) by mouth every 7 (seven) days. 12 capsule 0  . ertapenem 1 g in sodium chloride 0.9 % 50 mL Inject 1 g into the vein daily. (Patient not taking: Reported on 06/12/2015)    . polyethylene glycol (MIRALAX / GLYCOLAX) packet Take 17 g by mouth daily.  (Patient not taking: Reported on 06/12/2015) 14 each 0   No current facility-administered medications on file prior to visit.   Active Ambulatory Problems    Diagnosis Date Noted  . Diabetes mellitus due to underlying condition without complications 07/16/2014  . Obesity 07/16/2014  . Cellulitis of left upper extremity 07/16/2014  . HTN (hypertension) 04/30/2015  . Cellulitis 04/30/2015  . Hyponatremia 04/30/2015  . Abscess   . Abscess of right shoulder   . ARF (acute renal failure)   . Septic arthritis   . Cellulitis of right upper arm   . Anaerobic abscess   . Generalized weakness 05/15/2015  . Weakness 05/16/2015  . Pressure ulcer 05/17/2015   Resolved Ambulatory Problems    Diagnosis Date Noted  . No Resolved Ambulatory Problems   Past Medical History  Diagnosis Date  . Hypertension   . Asthma   . Cellulitis of right upper extremity   . Diabetes mellitus      Review of Systems Right shoulder pain, 10 point ros is negative    Objective:   Physical Exam BP 160/101 mmHg  Pulse 83  Temp(Src) 97 F (36.1 C) (Oral)  Ht  (1.727 m)  Wt 355 lb (161.027 kg)  BMI 53.99 kg/m2 Physical Exam  Constitutional:  oriented to person, place, and time. appears well-developed and well-nourished. No distress.  HENT: [place]/AT, PERRLA, no scleral icterus Skin: Skin is warm and  dry. No rash noted. No erythema. Small pinpoint serous drainage for incision site, otherwise well healed. No surrounding erythema. Left sided chest wall central line is c/d/i MSK = limited external rotation or extension of right shoulder/arm  Lab Results  Component Value Date   ESRSEDRATE 123* 05/16/2015   Recent labs: Lab Results  Component Value Date   ESRSEDRATE 63* 06/12/2015   Lab Results  Component Value Date   CRP 3.1* 06/12/2015       Assessment & Plan:   Septic arthritis of right shoulder = just finished 30 days of IV therapy. Due to still having some serous drainage and elevated  inflammatory markers, will have her do  cephalexin 500mg  QID x 4 wk. Will check sed rate, crp, and cbc. Will have her see IR to remove picc line  Chronic hepatitis b = patient has not had further workup in over a year, previously had seen GI. Will check labs and hep A Ab to see if she needs to be immunized.  Limited shoulder range of motion = concern for capsulitis/frozen shoulder, continue with physical therpay, and defer to dr. Eulah Pont for further management

## 2015-06-13 ENCOUNTER — Other Ambulatory Visit: Payer: Self-pay | Admitting: Internal Medicine

## 2015-06-13 DIAGNOSIS — L0390 Cellulitis, unspecified: Secondary | ICD-10-CM

## 2015-06-13 LAB — SEDIMENTATION RATE: Sed Rate: 63 mm/hr — ABNORMAL HIGH (ref 0–20)

## 2015-06-13 LAB — HEPATITIS A ANTIBODY, TOTAL: HEP A TOTAL AB: NONREACTIVE

## 2015-06-13 LAB — HIV ANTIBODY (ROUTINE TESTING W REFLEX): HIV 1&2 Ab, 4th Generation: NONREACTIVE

## 2015-06-14 ENCOUNTER — Ambulatory Visit (HOSPITAL_COMMUNITY)
Admission: RE | Admit: 2015-06-14 | Discharge: 2015-06-14 | Disposition: A | Discharge status: Home / Self Care | Payer: No Typology Code available for payment source | Source: Ambulatory Visit | Attending: Internal Medicine | Admitting: Internal Medicine

## 2015-06-14 ENCOUNTER — Telehealth: Payer: Self-pay | Admitting: *Deleted

## 2015-06-14 ENCOUNTER — Other Ambulatory Visit: Payer: Self-pay | Admitting: Licensed Clinical Social Worker

## 2015-06-14 DIAGNOSIS — L0390 Cellulitis, unspecified: Secondary | ICD-10-CM | POA: Diagnosis not present

## 2015-06-14 DIAGNOSIS — Z452 Encounter for adjustment and management of vascular access device: Secondary | ICD-10-CM | POA: Diagnosis not present

## 2015-06-14 DIAGNOSIS — M009 Pyogenic arthritis, unspecified: Secondary | ICD-10-CM

## 2015-06-14 LAB — HEPATITIS B E ANTIGEN: Hepatitis Be Antigen: NONREACTIVE

## 2015-06-14 LAB — HEPATITIS B E ANTIBODY: HEPATITIS BE ANTIBODY: REACTIVE — AB

## 2015-06-14 MED ORDER — LIDOCAINE HCL 1 % IJ SOLN
INTRAMUSCULAR | Status: AC
  Filled 2015-06-14: qty 20

## 2015-06-14 MED ORDER — CHLORHEXIDINE GLUCONATE 4 % EX LIQD
CUTANEOUS | Status: AC
  Filled 2015-06-14: qty 15

## 2015-06-14 MED ORDER — CEPHALEXIN 500 MG PO CAPS: 500.0000 mg | ORAL_CAPSULE | Freq: Four times a day (QID) | ORAL | Status: DC

## 2015-06-14 NOTE — Telephone Encounter (Signed)
Per verbal order from Dr Drue Second called IR to get the patient an appointment to have her PICC removed. Had to leave a message for Victorino Dike to call the office when she can. The PICC is placed in the patient left shoulder region because she was in renal failure at time of placement. Also let coworkers know as I will be out of the office next week.  Order is in and the patient will need to be notified once the appt is scheduled.

## 2015-06-14 NOTE — Procedures (Signed)
Successful bedside removal of tunneled PICC. No immediate complications.

## 2015-06-16 LAB — HEPATITIS B DNA, ULTRAQUANTITATIVE, PCR: Hepatitis B DNA: NOT DETECTED IU/mL (ref <20)

## 2015-06-17 NOTE — Telephone Encounter (Signed)
Per chart review, patient's PICC was removed by IR on 6/24.

## 2015-06-19 ENCOUNTER — Inpatient Hospital Stay: Payer: No Typology Code available for payment source | Admitting: Infectious Diseases

## 2015-06-25 ENCOUNTER — Encounter: Payer: Self-pay | Admitting: Internal Medicine

## 2015-06-25 ENCOUNTER — Ambulatory Visit: Payer: No Typology Code available for payment source | Attending: Internal Medicine | Admitting: Internal Medicine

## 2015-06-25 VITALS — BP 158/97 | HR 75 | Temp 98.0°F | Resp 16 | Wt 345.0 lb

## 2015-06-25 DIAGNOSIS — M25511 Pain in right shoulder: Secondary | ICD-10-CM | POA: Insufficient documentation

## 2015-06-25 DIAGNOSIS — Z794 Long term (current) use of insulin: Secondary | ICD-10-CM | POA: Diagnosis not present

## 2015-06-25 DIAGNOSIS — Z79899 Other long term (current) drug therapy: Secondary | ICD-10-CM | POA: Diagnosis not present

## 2015-06-25 DIAGNOSIS — Z7982 Long term (current) use of aspirin: Secondary | ICD-10-CM | POA: Diagnosis not present

## 2015-06-25 DIAGNOSIS — N898 Other specified noninflammatory disorders of vagina: Secondary | ICD-10-CM

## 2015-06-25 DIAGNOSIS — IMO0001 Reserved for inherently not codable concepts without codable children: Secondary | ICD-10-CM

## 2015-06-25 DIAGNOSIS — I10 Essential (primary) hypertension: Secondary | ICD-10-CM

## 2015-06-25 DIAGNOSIS — R030 Elevated blood-pressure reading, without diagnosis of hypertension: Secondary | ICD-10-CM | POA: Diagnosis not present

## 2015-06-25 DIAGNOSIS — E139 Other specified diabetes mellitus without complications: Secondary | ICD-10-CM | POA: Diagnosis not present

## 2015-06-25 DIAGNOSIS — Z8739 Personal history of other diseases of the musculoskeletal system and connective tissue: Secondary | ICD-10-CM

## 2015-06-25 LAB — POCT GLYCOSYLATED HEMOGLOBIN (HGB A1C): HEMOGLOBIN A1C: 8.1

## 2015-06-25 LAB — GLUCOSE, POCT (MANUAL RESULT ENTRY): POC Glucose: 227 mg/dl — AB (ref 70–99)

## 2015-06-25 MED ORDER — BACLOFEN 10 MG PO TABS: 10.0000 mg | ORAL_TABLET | Freq: Three times a day (TID) | ORAL | Status: DC

## 2015-06-25 MED ORDER — LISINOPRIL 10 MG PO TABS: 10.0000 mg | ORAL_TABLET | Freq: Every day | ORAL | Status: DC

## 2015-06-25 MED ORDER — CLONIDINE HCL 0.1 MG PO TABS
0.1000 mg | ORAL_TABLET | Freq: Once | ORAL | Status: AC
  Administered 2015-06-25: 0.1 mg via ORAL

## 2015-06-25 MED ORDER — FLUCONAZOLE 150 MG PO TABS: 150.0000 mg | ORAL_TABLET | Freq: Once | ORAL | Status: DC

## 2015-06-25 NOTE — Progress Notes (Signed)
MRN: [id number] Name: Lindsay [name]  Sex: female Age: 34 y.o. DOB: 1981-04-13  Allergies: Review of patient's allergies indicates no known allergies.  Chief Complaint  Patient presents with  . Hospitalization Follow-up    HPI: Patient is 34 y.o. female who was hospitalized 6 to 7 weeks ago and was diagnosed with septic arthritis of right shoulder as well as a right chest wall subcutaneous abscess which was drained  and was treated with IV antibiotics , subsequently she followed up with infectious disease 2 weeks ago had a PICC line which was removed and was advised to continue with oral antibiotic Keflex for next 4 weeks until she seen by ID this month, patient also has frozen shoulders/capsulitis following up with physical therapy and orthopedics. Today her blood pressure was elevated, was given clonidine and repeat manual blood pressure is 150/97, patient reported to have low blood pressure however when she was in the hospital for the last one week when she got discharged from rehabilitation she has been taking lisinopril 5 mg daily, for diabetes she has been taking Lantus and NovoLog, her hemoglobin A1c has trended down she is requesting refill on baclofen which she takes for muscle spasm, she's also complaining of vaginal discharge since she has been on antibiotic.  Past Medical History  Diagnosis Date  . Hypertension   . Asthma   . Obesity   . Cellulitis of right upper extremity   . ARF (acute renal failure) 04/2015  . Diabetes mellitus     insulin dependent    Past Surgical History  Procedure Laterality Date  . Cesarean section    . Tonsillectomy    . Leg surgery    . Irrigation and debridement shoulder Right 04/30/2015    Procedure: IRRIGATION AND DEBRIDEMENT SHOULDER;  Surgeon: Sheral Apley, MD;  Location: [facility] OR;  Service: Orthopedics;  Laterality: Right;  . Shoulder arthroscopy Right 04/30/2015    Procedure: ARTHROSCOPY SHOULDER;  Surgeon: Sheral Apley, MD;   Location: [facility] OR;  Service: Orthopedics;  Laterality: Right;  . Irrigation and debridement shoulder Right 05/01/2015      Medication List       This list is accurate as of: 06/25/15  5:34 PM.  Always use your most recent med list.               aspirin 81 MG tablet  Take 81 mg by mouth daily.     baclofen 10 MG tablet  Commonly known as:  LIORESAL  Take 1 tablet (10 mg total) by mouth 3 (three) times daily.     cephALEXin 500 MG capsule  Commonly known as:  KEFLEX  Take 1 capsule (500 mg total) by mouth 4 (four) times daily.     ferrous sulfate 325 (65 FE) MG tablet  Take 1 tablet (325 mg total) by mouth 2 (two) times daily with a meal.     fluconazole 150 MG tablet  Commonly known as:  DIFLUCAN  Take 1 tablet (150 mg total) by mouth once.     gabapentin 100 MG capsule  Commonly known as:  NEURONTIN  Take 1 capsule (100 mg total) by mouth 2 (two) times daily.     insulin aspart 100 UNIT/ML FlexPen  Commonly known as:  NOVOLOG  Inject 10 Units into the skin 3 (three) times daily with meals.     insulin glargine 100 unit/mL Sopn  Commonly known as:  LANTUS  Inject 0.65 mLs (65 Units total) into the  skin at bedtime.     lisinopril 10 MG tablet  Commonly known as:  PRINIVIL,ZESTRIL  Take 1 tablet (10 mg total) by mouth daily.     oxyCODONE-acetaminophen 5-325 MG per tablet  Commonly known as:  PERCOCET  Take 1-2 tablets by mouth every 4 (four) hours as needed for moderate pain.     polyethylene glycol packet  Commonly known as:  MIRALAX / GLYCOLAX  Take 17 g by mouth daily.     Vitamin D (Ergocalciferol) 50000 UNITS Caps capsule  Commonly known as:  DRISDOL  Take 1 capsule (50,000 Units total) by mouth every 7 (seven) days.        Meds ordered this encounter  Medications  . cloNIDine (CATAPRES) tablet 0.1 mg    Sig:   . DISCONTD: baclofen (LIORESAL) 10 MG tablet    Sig: Take 10 mg by mouth 3 (three) times daily.  Marland Kitchen DISCONTD: lisinopril (PRINIVIL,ZESTRIL)  5 MG tablet    Sig: Take 5 mg by mouth daily.  Marland Kitchen lisinopril (PRINIVIL,ZESTRIL) 10 MG tablet    Sig: Take 1 tablet (10 mg total) by mouth daily.    Dispense:  30 tablet    Refill:  3  . baclofen (LIORESAL) 10 MG tablet    Sig: Take 1 tablet (10 mg total) by mouth 3 (three) times daily.    Dispense:  30 each    Refill:  3  . fluconazole (DIFLUCAN) 150 MG tablet    Sig: Take 1 tablet (150 mg total) by mouth once.    Dispense:  1 tablet    Refill:  0    Immunization History  Administered Date(s) Administered  . Pneumococcal Polysaccharide-23 02/13/2015    Family History  Problem Relation Age of Onset  . Diabetes Mother   . Hypertension Mother   . Heart disease Mother   . Diabetes Father   . Heart disease Father   . Stroke Maternal Grandmother   . Cancer Maternal Grandmother     History  Substance Use Topics  . Smoking status: Never Smoker   . Smokeless tobacco: Never Used  . Alcohol Use: Yes     Comment: socially    Review of Systems   As noted in HPI  Filed Vitals:   06/25/15 1727  BP: 158/97  Pulse: 75  Temp:   Resp:     Physical Exam  Physical Exam  Constitutional: No distress.  Eyes: EOM are normal. Pupils are equal, round, and reactive to light.  Cardiovascular: Normal rate and regular rhythm.   Pulmonary/Chest: Breath sounds normal. No respiratory distress. She has no wheezes. She has no rales.  Musculoskeletal: She exhibits no edema.  Right shoulder limited range of motion    CBC    Component Value Date/Time   WBC 6.8 06/12/2015 1531   RBC 3.84* 06/12/2015 1531   HGB 9.2* 06/12/2015 1531   HCT 29.4* 06/12/2015 1531   PLT 288 06/12/2015 1531   MCV 76.6* 06/12/2015 1531   LYMPHSABS 2.0 05/16/2015 0558   MONOABS 0.4 05/16/2015 0558   EOSABS 0.1 05/16/2015 0558   BASOSABS 0.0 05/16/2015 0558    CMP     Component Value Date/Time   NA 137 06/12/2015 1531   K 4.2 06/12/2015 1531   CL 103 06/12/2015 1531   CO2 25 06/12/2015 1531    GLUCOSE 146* 06/12/2015 1531   BUN 10 06/12/2015 1531   CREATININE 0.60 06/12/2015 1531   CREATININE 1.54* 05/16/2015 0558   CALCIUM 9.1 06/12/2015  1531   PROT 7.1 06/12/2015 1531   ALBUMIN 3.5 06/12/2015 1531   AST 12 06/12/2015 1531   ALT <8 06/12/2015 1531   ALKPHOS 63 06/12/2015 1531   BILITOT 0.4 06/12/2015 1531   GFRNONAA 43* 05/16/2015 0558   GFRNONAA >89 02/13/2015 1607   GFRAA 50* 05/16/2015 0558   GFRAA >89 02/13/2015 1607    Lab Results  Component Value Date/Time   CHOL 165 07/16/2014 10:42 AM    Lab Results  Component Value Date/Time   HGBA1C 8.10 06/25/2015 04:40 PM   HGBA1C 12.7* 04/30/2015 07:55 AM    Lab Results  Component Value Date/Time   AST 12 06/12/2015 03:31 PM    Assessment and Plan  Other specified diabetes mellitus without complications - Plan:  Results for orders placed or performed in visit on 06/25/15  Glucose (CBG)  Result Value Ref Range   POC Glucose 227.0 (A) 70 - 99 mg/dl  HgB N1B  Result Value Ref Range   Hemoglobin A1C 8.10    Hemoglobin A1c has trended down, continue with diet modification continue with Lantus, NovoLog, repeat A1c in 3 months  Elevated blood pressure - Plan: cloNIDine (CATAPRES) tablet 0.1 mg  Essential hypertension - Plan:blood pressure is uncontrolled, have increased the dose of  lisinopril (PRINIVIL,ZESTRIL) 10 MG tablet, she'll come back in 2 weeks per nurse visit BP check  History of septic arthritis/right shoulder pain Patient is completed the course of IV antibiotic, currently on Keflex and following up with physical therapy, orthopedics and ID.  Vaginal discharge - Plan: likely secondary to antibiotic, I prescribed fluconazole (DIFLUCAN) 150 MG tablet   Return in about 3 months (around 09/25/2015) for diabetes, hypertension, BP check in 2 weeks/Nurse Visit.   This note has been created with Education officer, environmental. Any transcriptional errors are unintentional.      Doris Cheadle, MD

## 2015-06-25 NOTE — Progress Notes (Signed)
Patient here for follow up from hospital and rehab Patient had a cyst under her muscle to her upper right arm area Still has limited use of the arm but getting better with therapy

## 2015-06-25 NOTE — Patient Instructions (Signed)
DASH Eating Plan DASH stands for "Dietary Approaches to Stop Hypertension." The DASH eating plan is a healthy eating plan that has been shown to reduce high blood pressure (hypertension). Additional health benefits may include reducing the risk of type 2 diabetes mellitus, heart disease, and stroke. The DASH eating plan may also help with weight loss. WHAT DO I NEED TO KNOW ABOUT THE DASH EATING PLAN? For the DASH eating plan, you will follow these general guidelines:  Choose foods with a percent daily value for sodium of less than 5% (as listed on the food label).  Use salt-free seasonings or herbs instead of table salt or sea salt.  Check with your health care provider or pharmacist before using salt substitutes.  Eat lower-sodium products, often labeled as "lower sodium" or "no salt added."  Eat fresh foods.  Eat more vegetables, fruits, and low-fat dairy products.  Choose whole grains. Look for the word "whole" as the first word in the ingredient list.  Choose fish and skinless chicken or turkey more often than red meat. Limit fish, poultry, and meat to 6 oz (170 g) each day.  Limit sweets, desserts, sugars, and sugary drinks.  Choose heart-healthy fats.  Limit cheese to 1 oz (28 g) per day.  Eat more home-cooked food and less restaurant, buffet, and fast food.  Limit fried foods.  Cook foods using methods other than frying.  Limit canned vegetables. If you do use them, rinse them well to decrease the sodium.  When eating at a restaurant, ask that your food be prepared with less salt, or no salt if possible. WHAT FOODS CAN I EAT? Seek help from a dietitian for individual calorie needs. Grains Whole grain or whole wheat bread. Brown rice. Whole grain or whole wheat pasta. Quinoa, bulgur, and whole grain cereals. Low-sodium cereals. Corn or whole wheat flour tortillas. Whole grain cornbread. Whole grain crackers. Low-sodium crackers. Vegetables Fresh or frozen vegetables  (raw, steamed, roasted, or grilled). Low-sodium or reduced-sodium tomato and vegetable juices. Low-sodium or reduced-sodium tomato sauce and paste. Low-sodium or reduced-sodium canned vegetables.  Fruits All fresh, canned (in natural juice), or frozen fruits. Meat and Other Protein Products Ground beef (85% or leaner), grass-fed beef, or beef trimmed of fat. Skinless chicken or turkey. Ground chicken or turkey. Pork trimmed of fat. All fish and seafood. Eggs. Dried beans, peas, or lentils. Unsalted nuts and seeds. Unsalted canned beans. Dairy Low-fat dairy products, such as skim or 1% milk, 2% or reduced-fat cheeses, low-fat ricotta or cottage cheese, or plain low-fat yogurt. Low-sodium or reduced-sodium cheeses. Fats and Oils Tub margarines without trans fats. Light or reduced-fat mayonnaise and salad dressings (reduced sodium). Avocado. Safflower, olive, or canola oils. Natural peanut or almond butter. Other Unsalted popcorn and pretzels. The items listed above may not be a complete list of recommended foods or beverages. Contact your dietitian for more options. WHAT FOODS ARE NOT RECOMMENDED? Grains White bread. White pasta. White rice. Refined cornbread. Bagels and croissants. Crackers that contain trans fat. Vegetables Creamed or fried vegetables. Vegetables in a cheese sauce. Regular canned vegetables. Regular canned tomato sauce and paste. Regular tomato and vegetable juices. Fruits Dried fruits. Canned fruit in light or heavy syrup. Fruit juice. Meat and Other Protein Products Fatty cuts of meat. Ribs, chicken wings, bacon, sausage, bologna, salami, chitterlings, fatback, hot dogs, bratwurst, and packaged luncheon meats. Salted nuts and seeds. Canned beans with salt. Dairy Whole or 2% milk, cream, half-and-half, and cream cheese. Whole-fat or sweetened yogurt. Full-fat   cheeses or blue cheese. Nondairy creamers and whipped toppings. Processed cheese, cheese spreads, or cheese  curds. Condiments Onion and garlic salt, seasoned salt, table salt, and sea salt. Canned and packaged gravies. Worcestershire sauce. Tartar sauce. Barbecue sauce. Teriyaki sauce. Soy sauce, including reduced sodium. Steak sauce. Fish sauce. Oyster sauce. Cocktail sauce. Horseradish. Ketchup and mustard. Meat flavorings and tenderizers. Bouillon cubes. Hot sauce. Tabasco sauce. Marinades. Taco seasonings. Relishes. Fats and Oils Butter, stick margarine, lard, shortening, ghee, and bacon fat. Coconut, palm kernel, or palm oils. Regular salad dressings. Other Pickles and olives. Salted popcorn and pretzels. The items listed above may not be a complete list of foods and beverages to avoid. Contact your dietitian for more information. WHERE CAN I FIND MORE INFORMATION? National Heart, Lung, and Blood Institute: www.nhlbi.nih.gov/health/health-topics/topics/dash/ Document Released: 11/26/2011 Document Revised: 04/23/2014 Document Reviewed: 10/11/2013 ExitCare Patient Information 2015 [employer]. This information is not intended to replace advice given to you by your health care provider. Make sure you discuss any questions you have with your health care provider. Diabetes Mellitus and Food It is important for you to manage your blood sugar (glucose) level. Your blood glucose level can be greatly affected by what you eat. Eating healthier foods in the appropriate amounts throughout the day at about the same time each day will help you control your blood glucose level. It can also help slow or prevent worsening of your diabetes mellitus. Healthy eating may even help you improve the level of your blood pressure and reach or maintain a healthy weight.  HOW CAN FOOD AFFECT ME? Carbohydrates Carbohydrates affect your blood glucose level more than any other type of food. Your dietitian will help you determine how many carbohydrates to eat at each meal and teach you how to count carbohydrates. Counting  carbohydrates is important to keep your blood glucose at a healthy level, especially if you are using insulin or taking certain medicines for diabetes mellitus. Alcohol Alcohol can cause sudden decreases in blood glucose (hypoglycemia), especially if you use insulin or take certain medicines for diabetes mellitus. Hypoglycemia can be a life-threatening condition. Symptoms of hypoglycemia (sleepiness, dizziness, and disorientation) are similar to symptoms of having too much alcohol.  If your health care provider has given you approval to drink alcohol, do so in moderation and use the following guidelines:  Women should not have more than one drink per day, and men should not have more than two drinks per day. One drink is equal to:  12 oz of beer.  5 oz of wine.  1 oz of hard liquor.  Do not drink on an empty stomach.  Keep yourself hydrated. Have water, diet soda, or unsweetened iced tea.  Regular soda, juice, and other mixers might contain a lot of carbohydrates and should be counted. WHAT FOODS ARE NOT RECOMMENDED? As you make food choices, it is important to remember that all foods are not the same. Some foods have fewer nutrients per serving than other foods, even though they might have the same number of calories or carbohydrates. It is difficult to get your body what it needs when you eat foods with fewer nutrients. Examples of foods that you should avoid that are high in calories and carbohydrates but low in nutrients include:  Trans fats (most processed foods list trans fats on the Nutrition Facts label).  Regular soda.  Juice.  Candy.  Sweets, such as cake, pie, doughnuts, and cookies.  Fried foods. WHAT FOODS CAN I EAT? Have nutrient-rich foods,   which will nourish your body and keep you healthy. The food you should eat also will depend on several factors, including:  The calories you need.  The medicines you take.  Your weight.  Your blood glucose level.  Your  blood pressure level.  Your cholesterol level. You also should eat a variety of foods, including:  Protein, such as meat, poultry, fish, tofu, nuts, and seeds (lean animal proteins are best).  Fruits.  Vegetables.  Dairy products, such as milk, cheese, and yogurt (low fat is best).  Breads, grains, pasta, cereal, rice, and beans.  Fats such as olive oil, trans fat-free margarine, canola oil, avocado, and olives. DOES EVERYONE WITH DIABETES MELLITUS HAVE THE SAME MEAL PLAN? Because every person with diabetes mellitus is different, there is not one meal plan that works for everyone. It is very important that you meet with a dietitian who will help you create a meal plan that is just right for you. Document Released: 09/03/2005 Document Revised: 12/12/2013 Document Reviewed: 11/03/2013 ExitCare Patient Information 2015 [employer]. This information is not intended to replace advice given to you by your health care provider. Make sure you discuss any questions you have with your health care provider.  

## 2015-07-01 ENCOUNTER — Ambulatory Visit: Payer: Medicaid Other | Attending: Orthopedic Surgery | Admitting: Physical Therapy

## 2015-07-01 DIAGNOSIS — R293 Abnormal posture: Secondary | ICD-10-CM | POA: Diagnosis present

## 2015-07-01 DIAGNOSIS — R6889 Other general symptoms and signs: Secondary | ICD-10-CM

## 2015-07-01 DIAGNOSIS — R531 Weakness: Secondary | ICD-10-CM | POA: Diagnosis present

## 2015-07-01 DIAGNOSIS — M25511 Pain in right shoulder: Secondary | ICD-10-CM

## 2015-07-01 NOTE — Patient Instructions (Signed)
SHOULDER: External Rotation - Supine (Cane)   Hold cane with both hands. Rotate arm away from body. Keep elbow on floor and next to body. 10___ reps per set,2___ sets per day, _7__ days per week  Hold for 5 sec Add towel to keep elbow at side.  Copyright  VHI. All rights reserved.  Cane Horizontal - Supine   With straight arms holding cane above shoulders, bring cane out to right, center, out to left, and back to above head. Repeat _10 x 2__ times. Do _2-3__ times per day.  Copyright  VHI. All rights reserved.  Cane Exercise: Flexion   Lie on back, holding cane above chest. Keeping arms as straight as possible, lower cane toward floor beyond head. Hold __5__ seconds. Repeat __10 x 2__ times. Do 2-3____ sessions per day.  http://gt2.exer.us/91   Copyright  VHI. All rights reserved.  Heat Therapy USE before you exercise Heat therapy can help make painful, stiff muscles and joints feel better. Do not use heat on new injuries. Wait at least 48 hours after an injury to use heat. Do not use heat when you have aches or pains right after an activity. If you still have pain 3 hours after stopping the activity, then you may use heat. HOME CARE Wet heat pack  Soak a clean towel in warm water. Squeeze out the extra water.  Put the warm, wet towel in a plastic bag.  Place a thin, dry towel between your skin and the bag.  Put the heat pack on the area for 5 minutes, and check your skin. Your skin may be pink, but it should not be red.  Leave the heat pack on the area for 15 to 30 minutes.  Repeat this every 2 to 4 hours while awake. Do not use heat while you are sleeping. Warm water bath  Fill a tub with warm water.  Place the affected body part in the tub.  Soak the area for 20 to 40 minutes.  Repeat as needed. Hot water bottle  Fill the water bottle half full with hot water.  Press out the extra air. Close the cap tightly.  Place a dry towel between your skin and the  bottle.heat  Put the bottle on the area for 5 minutes, and check your skin. Your skin may be pink, but it should not be red.  Leave the bottle on the area for 15 to 30 minutes.  Repeat this every 2 to 4 hours while awake. Electric heating pad  Place a dry towel between your skin and the heating pad.  Set the heating pad on low heat.  Put the heating pad on the area for 10 minutes, and check your skin. Your skin may be pink, but it should not be red.  Leave the heating pad on the area for 20 to 40 minutes.  Repeat this every 2 to 4 hours while awake.  Do not lie on the heating pad.  Do not fall asleep while using the heating pad.  Do not use the heating pad near water. GET HELP RIGHT AWAY IF:  You get blisters or red skin.  Your skin is puffy (swollen), or you lose feeling (numbness) in the affected area.  You have any new problems.  Your problems are getting worse.  You have any questions or concerns. If you have any problems, stop using heat therapy until you see your doctor. MAKE SURE YOU:  Understand these instructions.  Will watch your condition.  Will  get help right away if you are not doing well or get worse. Document Released: 02/29/2012 Document Reviewed: 01/30/2014 [facility] Patient Information 2015 [place], [place]. This information is not intended to replace advice given to you by your health care provider. Make sure you discuss any questions you have with your health care provider.  Cryotherapy   USE if painful or after exercise  Cryotherapy means treatment with cold. Ice or gel packs can be used to reduce both pain and swelling. Ice is the most helpful within the first 24 to 48 hours after an injury or flare-up from overusing a muscle or joint. Sprains, strains, spasms, burning pain, shooting pain, and aches can all be eased with ice. Ice can also be used when recovering from surgery. Ice is effective, has very few side effects, and is safe for most people  to use. PRECAUTIONS  Ice is not a safe treatment option for people with:  Raynaud phenomenon. This is a condition affecting small blood vessels in the extremities. Exposure to cold may cause your problems to return.  Cold hypersensitivity. There are many forms of cold hypersensitivity, including:  Cold urticaria. Red, itchy hives appear on the skin when the tissues begin to warm after being iced.  Cold erythema. This is a red, itchy rash caused by exposure to cold.  Cold hemoglobinuria. Red blood cells break down when the tissues begin to warm after being iced. The hemoglobin that carry oxygen are passed into the urine because they cannot combine with blood proteins fast enough.  Numbness or altered sensitivity in the area being iced. If you have any of the following conditions, do not use ice until you have discussed cryotherapy with your caregiver:  Heart conditions, such as arrhythmia, angina, or chronic heart disease.  High blood pressure.  Healing wounds or open skin in the area being iced.  Current infections.  Rheumatoid arthritis.  Poor circulation.  Diabetes. Ice slows the blood flow in the region it is applied. This is beneficial when trying to stop inflamed tissues from spreading irritating chemicals to surrounding tissues. However, if you expose your skin to cold temperatures for too long or without the proper protection, you can damage your skin or nerves. Watch for signs of skin damage due to cold. HOME CARE INSTRUCTIONS Follow these tips to use ice and cold packs safely.  Place a dry or damp towel between the ice and skin. A damp towel will cool the skin more quickly, so you may need to shorten the time that the ice is used.  For a more rapid response, add gentle compression to the ice.  Ice for no more than 10 to 20 minutes at a time. The bonier the area you are icing, the less time it will take to get the benefits of ice.  Check your skin after 5 minutes to  make sure there are no signs of a poor response to cold or skin damage.  Rest 20 minutes or more between uses.  Once your skin is numb, you can end your treatment. You can test numbness by very lightly touching your skin. The touch should be so light that you do not see the skin dimple from the pressure of your fingertip. When using ice, most people will feel these normal sensations in this order: cold, burning, aching, and numbness.  Do not use ice on someone who cannot communicate their responses to pain, such as small children or people with dementia. HOW TO MAKE AN ICE PACK Ice  packs are the most common way to use ice therapy. Other methods include ice massage, ice baths, and cryosprays. Muscle creams that cause a cold, tingly feeling do not offer the same benefits that ice offers and should not be used as a substitute unless recommended by your caregiver. To make an ice pack, do one of the following:  Place crushed ice or a bag of frozen vegetables in a sealable plastic bag. Squeeze out the excess air. Place this bag inside another plastic bag. Slide the bag into a pillowcase or place a damp towel between your skin and the bag.  Mix 3 parts water with 1 part rubbing alcohol. Freeze the mixture in a sealable plastic bag. When you remove the mixture from the freezer, it will be slushy. Squeeze out the excess air. Place this bag inside another plastic bag. Slide the bag into a pillowcase or place a damp towel between your skin and the bag. SEEK MEDICAL CARE IF:  You develop white spots on your skin. This may give the skin a blotchy (mottled) appearance.  Your skin turns blue or pale.  Your skin becomes waxy or hard.  Your swelling gets worse. MAKE SURE YOU:   Understand these instructions.  Will watch your condition.  Will get help right away if you are not doing well or get worse. Document Released: 08/03/2011 Document Revised: 04/23/2014 Document Reviewed: 08/03/2011 [facility]  Patient Information 2015 [place], [place]. This information is not intended to replace advice given to you by your health care provider. Make sure you discuss any questions you have with your health care provider. Garen Lah, PT 07/01/2015 4:17 PM Phone: [phone] Fax: [phone]

## 2015-07-01 NOTE — Therapy (Signed)
[facility] [address] [place], [place], [address] Phone: [phone]   Fax:  [phone]  Physical Therapy Evaluation  Patient Details  Name: Lindsay [name] MRN: [id number] Date of Birth: 1981-09-08 Referring Provider:  Sheral Apley, MD  Encounter Date: 07/01/2015      PT End of Session - 07/01/15 1849    Visit Number 1   Number of Visits 16   Date for PT Re-Evaluation 08/26/15   Authorization Type [employer]   Authorization Time Period 08-26-15   Authorization - Visit Number 1   Authorization - Number of Visits 16   PT Start Time 0346   PT Stop Time 0445   PT Time Calculation (min) 59 min   Activity Tolerance Patient tolerated treatment well   Behavior During Therapy [facility] for tasks assessed/performed      Past Medical History  Diagnosis Date  . Hypertension   . Asthma   . Obesity   . Cellulitis of right upper extremity   . ARF (acute renal failure) 04/2015  . Diabetes mellitus     insulin dependent    Past Surgical History  Procedure Laterality Date  . Cesarean section    . Tonsillectomy    . Leg surgery    . Irrigation and debridement shoulder Right 04/30/2015    Procedure: IRRIGATION AND DEBRIDEMENT SHOULDER;  Surgeon: Sheral Apley, MD;  Location: [facility] OR;  Service: Orthopedics;  Laterality: Right;  . Shoulder arthroscopy Right 04/30/2015    Procedure: ARTHROSCOPY SHOULDER;  Surgeon: Sheral Apley, MD;  Location: [facility] OR;  Service: Orthopedics;  Laterality: Right;  . Irrigation and debridement shoulder Right 05/01/2015    There were no vitals filed for this visit.  Visit Diagnosis:  Abnormal posture  Generalized weakness  Pain in right shoulder  Activity intolerance      Subjective Assessment - 07/01/15 1551    Subjective Pt does not have pain at rest.  Moving my arm really hurts   Pertinent History s/p septic abscess of R shoulder   Limitations Lifting;House hold activities    How long can you sit comfortably? 1hour   How long can you stand comfortably? 90 min   How long can you walk comfortably? 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    Pain Score 6    Pain Location Shoulder   Pain Orientation Right   Pain Descriptors / Indicators Aching;Spasm            [facility] PT Assessment - 07/01/15 1552    Assessment   Medical Diagnosis R shoulder pain , I/D of septic shoulder abscess   Onset Date/Surgical Date 04/30/15   Hand Dominance Right   Next MD Visit 07-19-15   Prior Therapy inpatient rehab [facility].   Precautions   Precautions None;Fall  initially from hospital Falls   Precaution Comments Also HEPATITIS B   Restrictions   Weight Bearing Restrictions No   Balance Screen   Has the patient fallen in the past 6 months Yes   How many times? 1  weak after surgery/could not live at home alone due to weakn   Has the patient had a decrease in activity level because of a fear of falling?  Yes   Is the patient reluctant to leave  their home because of a fear of falling?  Yes   Home Environment   Living Environment Private residence   Living Arrangements Children   Home Access Stairs to enter   Entrance Stairs-Number of Steps 2   Entrance Stairs-Rails None   Home Layout One level   Cognition   Overall Cognitive Status Within Functional Limits for tasks assessed   Observation/Other Assessments   Focus on Therapeutic Outcomes (FOTO)  Intake 42%, limitation 58% predicted 33%   Observation/Other Assessments-Edema    Edema Circumferential  69 cm around axilla Right   Posture/Postural Control   Posture/Postural Control Postural limitations   Postural Limitations Rounded Shoulders;Forward head;Increased lumbar lordosis   Posture Comments Pt presents with severe gaurding or R shoulder  decreased arm  swing with gait   AROM   Right Shoulder Flexion 86 Degrees   Right Shoulder ABduction 90 Degrees   Right Shoulder Internal Rotation 34 Degrees  erp   Right Shoulder External Rotation 25 Degrees   Left Shoulder Flexion 160 Degrees   Left Shoulder ABduction 160 Degrees   Left Shoulder Internal Rotation 70 Degrees   Left Shoulder External Rotation 90 Degrees   Strength   Right Shoulder Flexion 3-/5   Right Shoulder ABduction 3-/5   Left Shoulder Flexion 4/5   Left Shoulder ABduction 4/5   Right Hand Grip (lbs) 44, 45, 45 lb   Left Hand Grip (lbs) 32, 34, 34 lb                   [facility] Adult PT Treatment/Exercise - 07/01/15 1552    Shoulder Exercises: Supine   External Rotation AAROM;10 reps  cane VC   Flexion AAROM;10 reps   cane VC and TC   Flexion Limitations pain to 110   ABduction AAROM;10 reps   cane VC and TC   ABduction Limitations pain   Modalities   Modalities Moist Heat;Cryotherapy   Moist Heat Therapy   Number Minutes Moist Heat --   Moist Heat Location --   Cryotherapy   Number Minutes Cryotherapy 15 Minutes   Cryotherapy Location --  shoulder right in sitting   Type of Cryotherapy Ice pack   Electrical Stimulation   Electrical Stimulation Location right shoulder   Electrical Stimulation Action IFC   Electrical Stimulation Parameters to pt tolerance for 15 min   Electrical Stimulation Goals Pain                PT Education - 07/01/15 1620    Education provided Yes   Education Details Explanationf of findings, cryotherapy for pain adn heat before exercise and supine cane exercises for ER, flex and abd   Person(s) Educated Patient   Methods Explanation;Demonstration;Tactile cues;Verbal cues;Handout   Comprehension Verbalized understanding;Returned demonstration;Need further instruction          PT Short Term Goals - 07/01/15 1902    PT SHORT TERM GOAL #1   Title "Independent with initial HEP   Time 4   Period Weeks   Status New    PT SHORT TERM GOAL #2   Title "Report pain decrease from    /10 to    /10.   Time 4   Period Weeks   Status New   PT SHORT TERM GOAL #3   Title Pt will be able to flex R shoulder past 90 degrees for ease of performing ADL's   Time 4   Period Weeks   Status New   PT SHORT TERM GOAL #4  Title Clinical cytogeneticist and verbalize understanding of condition management including RICE,heat, positioning, HEP,    Time 4   Period Weeks   Status New           PT Long Term Goals - 07/01/15 1905    PT LONG TERM GOAL #1   Title "Pt will be independent with advanced HEP.    Time 8   Period Weeks   Status New   PT LONG TERM GOAL #2   Title "Pain will decrease to 1/10 with all functional activities   Time 8   Period Weeks   Status New   PT LONG TERM GOAL #3   Title "R shoulder AROM scaption will improve to 0-140 degrees for improved overhead reaching.    Time 8   Period Weeks   Status New   PT LONG TERM GOAL #4   Title "FOTO will improve from 58% limitation    to  33%liimitation   indicating improved functional mobility     Time 8   Period Weeks   Status New   PT LONG TERM GOAL #5   Title Pt will be able to perform ADL's with sufficient ER and IR without exacerbating pain to complete activity   Time 8   Period Weeks   Status New               Plan - 07/01/15 1850    Clinical Impression Statement Pt presents with sign and symptoms compatible with R adhesive capsulitis. Pt is tentative and tearful with movement of arm after I/D of septic abscess in R shoulder on Apr 30, 2015.  Pt is unable to raise R arm past 90 degrees actively and .  Strength is 3-/5 in abd and flex of Right arm.  Pt will benefit from skilled PT  for pain, limited ROM and weakness which impedes her ADL's and functional activities.  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  Pt  was weak after hospital stay and spent time in [facility] rehab before return  ing home due to weakness and hospital stay.  Pt reports 1 fall  in last  6 months .  Balance will be assessed next visit.   Pt will benefit from skilled therapeutic intervention in order to improve on the following deficits Pain;Impaired UE functional use;Increased fascial restricitons;Increased muscle spasms;Impaired perceived functional ability;Decreased scar mobility;Decreased strength;Decreased range of motion;Postural dysfunction;Increased edema   Rehab Potential Good   PT Frequency 2x / week   PT Duration 8 weeks   PT Treatment/Interventions ADLs/Self Care Home Management;Cryotherapy;Electrical Stimulation;Iontophoresis /ml Dexamethasone;Moist Heat;Ultrasound;Therapeutic exercise;Patient/family education;Manual techniques;Taping;Dry needling;Passive range of motion;Scar mobilization;Vasopneumatic Device   PT Next Visit Plan Continue pain releif AAROM/AROM , soft tissue and modalities for pain.     PT Home Exercise Plan supine cane and ice/heat for pain at home   Consulted and Agree with Plan of Care Patient         Problem List Patient Active Problem List   Diagnosis Date Noted  . Pressure ulcer 05/17/2015  . Weakness 05/16/2015  . Generalized weakness 05/15/2015  . Anaerobic abscess   . Cellulitis of right upper arm   . ARF (acute renal failure)   . Septic arthritis   . HTN (hypertension) 04/30/2015  . Cellulitis 04/30/2015  . Hyponatremia 04/30/2015  . Abscess   . Abscess of right shoulder   . Diabetes mellitus due to underlying condition without complications  07/16/2014  . Obesity 07/16/2014  . Cellulitis of left upper extremity 07/16/2014   Garen Lah, PT 07/01/2015 7:10 PM Phone: [phone] Fax: [phone]  [facility] [address] [place], [place], [address] Phone: [phone]   Fax:  [phone]

## 2015-07-08 ENCOUNTER — Ambulatory Visit: Payer: Medicaid Other | Admitting: Physical Therapy

## 2015-07-08 DIAGNOSIS — M25511 Pain in right shoulder: Secondary | ICD-10-CM

## 2015-07-08 DIAGNOSIS — R6889 Other general symptoms and signs: Secondary | ICD-10-CM

## 2015-07-08 DIAGNOSIS — R293 Abnormal posture: Secondary | ICD-10-CM | POA: Diagnosis not present

## 2015-07-08 DIAGNOSIS — R531 Weakness: Secondary | ICD-10-CM
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
## 2015-07-12 ENCOUNTER — Encounter: Payer: Self-pay | Admitting: Rehabilitative and Restorative Service Providers"

## 2015-07-12 ENCOUNTER — Ambulatory Visit: Payer: Medicaid Other | Admitting: Rehabilitative and Restorative Service Providers"

## 2015-07-12 DIAGNOSIS — M25511 Pain in right shoulder: Secondary | ICD-10-CM

## 2015-07-12 DIAGNOSIS — R293 Abnormal posture: Secondary | ICD-10-CM

## 2015-07-12 DIAGNOSIS — R531 Weakness: Secondary | ICD-10-CM

## 2015-07-12 NOTE — Therapy (Signed)
[facility] Outpatient Rehabilitation [facility] [address] [place], [place], [address] Phone: [phone]   Fax:  [phone]  Physical Therapy Treatment  Patient Details  Name: Lindsay [name] MRN: [id number] Date of Birth: July 15, 1981 Referring Provider:  Doris Cheadle, MD  Encounter Date: 07/12/2015      PT End of Session - 07/12/15 1016    Visit Number 3   Number of Visits 16   Date for PT Re-Evaluation 08/26/15   Authorization Type [employer]   Authorization Time Period 08-26-15   Authorization - Visit Number 1   Authorization - Number of Visits 16      Past Medical History  Diagnosis Date  . Hypertension   . Asthma   . Obesity   . Cellulitis of right upper extremity   . ARF (acute renal failure) 04/2015  . Diabetes mellitus     insulin dependent    Past Surgical History  Procedure Laterality Date  . Cesarean section    . Tonsillectomy    . Leg surgery    . Irrigation and debridement shoulder Right 04/30/2015    Procedure: IRRIGATION AND DEBRIDEMENT SHOULDER;  Surgeon: Sheral Apley, MD;  Location: [facility] OR;  Service: Orthopedics;  Laterality: Right;  . Shoulder arthroscopy Right 04/30/2015    Procedure: ARTHROSCOPY SHOULDER;  Surgeon: Sheral Apley, MD;  Location: [facility] OR;  Service: Orthopedics;  Laterality: Right;  . Irrigation and debridement shoulder Right 05/01/2015    There were no vitals filed for this visit.  Visit Diagnosis:  Abnormal posture  Generalized weakness  Pain in right shoulder      Subjective Assessment - 07/12/15 0944    Subjective I started back doing my exercises 2x/day; pain is a little better   Pertinent History s/p septic abscess of R shoulder   Limitations Lifting;House hold activities   How long can you sit comfortably? 1.5 hours   How long can you stand comfortably? "fine with that now"   How long can you walk comfortably? "no problem"   Diagnostic tests MRI, CT scan    Patient Stated Goals to  be rid of this pain   Currently in Pain? Yes   Pain Score 6    Pain Location Shoulder   Pain Orientation Right   Pain Descriptors / Indicators Aching   Pain Type Acute pain   Pain Radiating Towards R medial deltoid   Pain Onset In the past 7 days   Pain Frequency Constant   Aggravating Factors  touching and using shoulder   Pain Relieving Factors heat   Effect of Pain on Daily Activities decreases ability to perform activities w/o pain   Multiple Pain Sites No                         [facility] Adult PT Treatment/Exercise - 07/12/15 0001    Shoulder Exercises: Supine   Other Supine Exercises bil shoulder ER x 20 YTB, YTB R chest press x 15   Shoulder Exercises: Seated   Other Seated Exercises scapular retract with pulleys x 1 min flex/ext and abdct/addct; AROM shoulder flex with scap retract and depression x 20; scap retract with R ER; Scifit bil UE level 1 x 5 min with PT vc's and tactile cues for R scapular retract and depression, R until scapular retract only; R until scapular depression x 20 only   Shoulder Exercises: Standing   Other Standing Exercises YTB R shoulder depression, ext, row x  15 to 20 reps ea with scap retract and depression   Moist Heat Therapy   Number Minutes Moist Heat 10 Minutes   Moist Heat Location Shoulder  R mid humerus   Manual Therapy   Manual Therapy Soft tissue mobilization   Manual therapy comments mid humerus                PT Education - 07/12/15 1016    Education provided Yes   Education Details see pt instrxn   Person(s) Educated Patient   Methods Demonstration   Comprehension Verbalized understanding          PT Short Term Goals - 07/12/15 1126    PT SHORT TERM GOAL #1   Title "Independent with initial HEP   Time 4   Period Weeks   Status On-going   PT SHORT TERM GOAL #2   Title Pt will report pain to decrease to 4-5/10 with work duties   Time 4   Period Weeks   Status New   PT SHORT TERM GOAL #3   Title  Pt will be able to flex R shoulder past 90 degrees for ease of performing ADL's with 50% reduced pain   Time 4   Period Weeks   Status Revised   PT SHORT TERM GOAL #4   Title "Demonstrate and verbalize understanding of condition management including RICE,heat, positioning, HEP,    Time 4   Period Weeks   Status Achieved           PT Long Term Goals - 07/12/15 1127    PT LONG TERM GOAL #1   Title "Pt will be independent with advanced HEP.    Time 8   Period Weeks   Status On-going   PT LONG TERM GOAL #2   Title "Pain will decrease to 1/10 with all functional activities   Time 8   Period Weeks   Status On-going   PT LONG TERM GOAL #3   Title "R shoulder AROM scaption will improve to 0-140 degrees for improved overhead reaching.    Time 8   Period Weeks   Status On-going   PT LONG TERM GOAL #4   Title "FOTO will improve from 58% limitation    to  33%liimitation   indicating improved functional mobility     Time 8   Period Weeks   Status On-going               Problem List Patient Active Problem List   Diagnosis Date Noted  . Pressure ulcer 05/17/2015  . Weakness 05/16/2015  . Generalized weakness 05/15/2015  . Anaerobic abscess   . Cellulitis of right upper arm   . ARF (acute renal failure)   . Septic arthritis   . HTN (hypertension) 04/30/2015  . Cellulitis 04/30/2015  . Hyponatremia 04/30/2015  . Abscess   . Abscess of right shoulder   . Diabetes mellitus due to underlying condition without complications 07/16/2014  . Obesity 07/16/2014  . Cellulitis of left upper extremity 07/16/2014    Thornell Sartorius , PT, DPT  07/12/2015, 11:38 AM  [facility] [address] [place], [place], [address] Phone: [phone]   Fax:  [phone]

## 2015-07-12 NOTE — Patient Instructions (Signed)
Advised pt she may be sore after tx and to perform HEP and use heat as needed. She agreed. No increased c/o pain at this time.

## 2015-07-15 ENCOUNTER — Ambulatory Visit: Payer: No Typology Code available for payment source | Admitting: Internal Medicine

## 2015-07-17 ENCOUNTER — Ambulatory Visit: Payer: No Typology Code available for payment source | Admitting: Internal Medicine

## 2015-07-22 ENCOUNTER — Ambulatory Visit: Payer: Medicaid Other | Attending: Orthopedic Surgery | Admitting: Physical Therapy

## 2015-07-22 DIAGNOSIS — M25511 Pain in right shoulder: Secondary | ICD-10-CM | POA: Insufficient documentation

## 2015-07-22 DIAGNOSIS — R6889 Other general symptoms and signs: Secondary | ICD-10-CM | POA: Diagnosis present

## 2015-07-22 DIAGNOSIS — R531 Weakness: Secondary | ICD-10-CM | POA: Diagnosis present

## 2015-07-22 DIAGNOSIS — R293 Abnormal posture: Secondary | ICD-10-CM

## 2015-07-22 NOTE — Therapy (Addendum)
[facility] Outpatient Rehabilitation [facility] [address] [place], [place], [address] Phone: [phone]   Fax:  [phone]  Physical Therapy Treatment  Patient Details  Name: Lindsay [name] MRN: [id number] Date of Birth: March 20, 1981 Referring Provider:  Doris Cheadle, MD  Encounter Date: 07/22/2015      PT End of Session - 07/22/15 1511    Visit Number 4   Number of Visits 16   Date for PT Re-Evaluation 08/26/15   Authorization Type [employer]   Authorization Time Period 08-26-15   PT Start Time 0216   PT Stop Time 0321   PT Time Calculation (min) 65 min   Activity Tolerance Patient tolerated treatment well   Behavior During Therapy [facility] for tasks assessed/performed      Past Medical History  Diagnosis Date  . Hypertension   . Asthma   . Obesity   . Cellulitis of right upper extremity   . ARF (acute renal failure) 04/2015  . Diabetes mellitus     insulin dependent    Past Surgical History  Procedure Laterality Date  . Cesarean section    . Tonsillectomy    . Leg surgery    . Irrigation and debridement shoulder Right 04/30/2015    Procedure: IRRIGATION AND DEBRIDEMENT SHOULDER;  Surgeon: Sheral Apley, MD;  Location: [facility] OR;  Service: Orthopedics;  Laterality: Right;  . Shoulder arthroscopy Right 04/30/2015    Procedure: ARTHROSCOPY SHOULDER;  Surgeon: Sheral Apley, MD;  Location: [facility] OR;  Service: Orthopedics;  Laterality: Right;  . Irrigation and debridement shoulder Right 05/01/2015    There were no vitals filed for this visit.  Visit Diagnosis:  Abnormal posture  Generalized weakness  Pain in right shoulder  Activity intolerance      Subjective Assessment - 07/22/15 1423    Subjective Lindsay [name] said that she her exercises every day now but her pain today is an 8/10   Pertinent History s/p septic abscess of R shoulder   Limitations Lifting;House hold activities   How long can you sit comfortably? 1-2 hours    How  long can you walk comfortably? "no problem"   Diagnostic tests MRI, CT scan    Patient Stated Goals to be rid of this pain   Pain Score 8    Pain Location Shoulder   Pain Orientation Left   Pain Descriptors / Indicators Aching   Pain Type Acute pain   Pain Onset 1 to 4 weeks ago   Pain Frequency Constant   Aggravating Factors  everything            [facility] PT Assessment - 07/22/15 1459    AROM   Right Shoulder Flexion 120 Degrees   Right Shoulder ABduction 115 Degrees   Right Shoulder Internal Rotation 50 Degrees  erp   Right Shoulder External Rotation 68 Degrees   Left Shoulder Flexion 160 Degrees   Left Shoulder ABduction 160 Degrees   Left Shoulder Internal Rotation 70 Degrees   Left Shoulder External Rotation 90 Degrees   Strength   Right Shoulder Flexion 4-/5   Right Shoulder ABduction 4-/5   Left Shoulder Flexion 4+/5   Left Shoulder ABduction 4/5                     [facility] Adult PT Treatment/Exercise - 07/22/15 1426    Shoulder Exercises: Supine   Other Supine Exercises supine scap with red t band for flex , abd and diagonals 10 x each  Shoulder Exercises: Seated   Other Seated Exercises scapular retract with pulleys x 1 min flex/ext and abdct/addct; AROM shoulder flex with scap retract and depression x 20; scap retract with R ER; Scifit bil UE level 1 x 5 min with PT vc's and tactile cues for R scapular retract and depression, R until scapular retract only; R until scapular depression x 20 only   Shoulder Exercises: Standing   Other Standing Exercises UE ranger for flex and abd and IR, Horiz abd and add ER/IR x 10 each   Other Standing Exercises wall clock and forward flex with wall and LE stepping. 15 x  also standing ER with body turn and towel stretch for IR with 2 -3 x with 30 to 60 sec stretch   Shoulder Exercises: Stretch   Wall Stretch - Flexion 2 reps;30 seconds   Wall Stretch - ABduction 2 reps;30 seconds   Other Shoulder Stretches sleeper  stretch ant and post capsule for 30 to 60 sec each   Moist Heat Therapy   Number Minutes Moist Heat 15 Minutes   Moist Heat Location Shoulder  R mid humerus   Cryotherapy   Number Minutes Cryotherapy --   Cryotherapy Location --   Electrical Stimulation   Electrical Stimulation Location right should   Electrical Stimulation Action IFC   Electrical Stimulation Parameters to pt tolerance for 15 min   Electrical Stimulation Goals Pain   Manual Therapy   Manual Therapy Soft tissue mobilization;Joint mobilization   Manual therapy comments mid humerus   Joint Mobilization Inf glide, maitland IR and ER grade 2/3   Soft tissue mobilization shoulder soft tissue upper trap and RTC                 PT Education - 07/22/15 1449    Education provided Yes   Education Details Pt added to HEP for sleeper stretch for posterior and ant capsule.  towel  IR and wall clock and well as supine scap exercises   Person(s) Educated Patient   Methods Explanation;Demonstration;Verbal cues   Comprehension Verbalized understanding;Returned demonstration          PT Short Term Goals - 07/22/15 1740    PT SHORT TERM GOAL #1   Title "Independent with initial HEP   Time 4   Period Weeks   Status On-going   PT SHORT TERM GOAL #2   Title Pt will report pain to decrease to 4-5/10 with work duties   Time 4   Period Weeks   Status On-going   PT SHORT TERM GOAL #3   Title Pt will be able to flex R shoulder past 90 degrees for ease of performing ADL's with 50% reduced pain   Baseline AROM 120 but pain at 8/10    Time 4   Period Weeks   Status On-going   PT SHORT TERM GOAL #4   Title "Demonstrate and verbalize understanding of condition management including RICE,heat, positioning, HEP,    Time 4   Period Weeks   Status Achieved           PT Long Term Goals - 07/12/15 1127    PT LONG TERM GOAL #1   Title "Pt will be independent with advanced HEP.    Time 8   Period Weeks   Status  On-going   PT LONG TERM GOAL #2   Title "Pain will decrease to 1/10 with all functional activities   Time 8   Period Weeks   Status On-going  PT LONG TERM GOAL #3   Title "R shoulder AROM scaption will improve to 0-140 degrees for improved overhead reaching.    Time 8   Period Weeks   Status On-going   PT LONG TERM GOAL #4   Title "FOTO will improve from 58% limitation    to  33%liimitation   indicating improved functional mobility     Time 8   Period Weeks   Status On-going               Plan - 07/22/15 1731    Clinical Impression Statement Pt enters clinic with 8/10 pain, but after exercise and treament reduced to 4/10 .  pt with increased AROM of R shoulder , flex 120, abd 115, IR 50 and 68 ER.  Pt has been having a difficult time getting into appt and PT made sure she was able to perform exercies at home to increase AROM .  Pt is much improved from evaluation for AROM. MMT for R shoulder for flex and abd is 4-/5.    Pt will benefit from skilled therapeutic intervention in order to improve on the following deficits Pain;Impaired UE functional use;Increased fascial restricitons;Increased muscle spasms;Impaired perceived functional ability;Decreased scar mobility;Decreased strength;Decreased range of motion;Postural dysfunction;Increased edema   Rehab Potential Good   PT Frequency 2x / week   PT Duration 8 weeks   PT Treatment/Interventions ADLs/Self Care Home Management;Cryotherapy;Electrical Stimulation;Iontophoresis /ml Dexamethasone;Moist Heat;Ultrasound;Therapeutic exercise;Patient/family education;Manual techniques;Taping;Dry needling;Passive range of motion;Scar mobilization;Vasopneumatic Device   PT Next Visit Plan Continue pain releif AAROM/AROM , soft tissue and modalities for pain. progress supine scap and review ex already given, pt post surgery for septic shoulder jt.  Must be seen 2 x a week.  Pt put on waiting list. to get appt in.   PT Home Exercise Plan wall  clock, sleeper stretch for ant and post capsule, supine scap flex, abd and diagonal.   Consulted and Agree with Plan of Care Patient        Problem List Patient Active Problem List   Diagnosis Date Noted  . Pressure ulcer 05/17/2015  . Weakness 05/16/2015  . Generalized weakness 05/15/2015  . Anaerobic abscess   . Cellulitis of right upper arm   . ARF (acute renal failure)   . Septic arthritis   . HTN (hypertension) 04/30/2015  . Cellulitis 04/30/2015  . Hyponatremia 04/30/2015  . Abscess   . Abscess of right shoulder   . Diabetes mellitus due to underlying condition without complications 07/16/2014  . Obesity 07/16/2014  . Cellulitis of left upper extremity 07/16/2014    Garen Lah, PT 07/22/2015 5:52 PM Phone: [phone] Fax: [phone]  [facility] [address] [place], [place], [address] Phone: [phone]   Fax:  [phone]

## 2015-07-22 NOTE — Patient Instructions (Addendum)
ROM: Towel Stretch - with Interior Rotation   Pull left arm up behind back by pulling towel up with other arm. Hold _30__ seconds. Repeat _3___ times per set. Do _1___ sets per session. Do __4-5__ sessions per day.  http://orth.exer.us/889   Copyright  VHI. All rights reserved.   Wall Clock Stretch as shown in the clinic.  Do 3 x a day to increase ROM with support.   Closed Chain: Shoulder External Rotation - on Wall   One hand on wall, rotate shoulder by stepping outward. Step _10__ times  Hold 10 seconds, each side, _2-3__ times per day.  http://ss.exer.us/271   Copyright  VHI. All rights reserved.      Anterior Capsule Sleeper Stretch, Side-Lying   Lie on side, pillow under head, neck in neutral, underside arm in 90-90 of shoulder and elbow flexion with scapula fixed to table. Use other hand to press palm of underside arm forward and downward. Keep elbow angle. Hold _30-60__ seconds.  Repeat _2__ times per session. Do _1-2__ sessions per day. Advanced: Use PNF contract-relax method.Posterior Capsule Sleeper Stretch, Side-Lying   Lie on side, pillow under head, neck in neutral, underside arm in 90-90 of shoulder and elbow flexion with scapula fixed to table. Use other hand to press back of underside arm forward and downward. Keep elbow angle. Hold _30-60__ seconds.  Repeat _1-2__ times per session. Do _1-2__ sessions per day. Advanced: Use PNF contract-relax method.  Copyright  VHI. All rights reserved.  Over Head Pull: Narrow Grip       On back, knees bent, feet flat, band across thighs, elbows straight but relaxed. Pull hands apart (start). Keeping elbows straight, bring arms up and over head, hands toward floor. Keep pull steady on band. Hold momentarily. Return slowly, keeping pull steady, back to start. Repeat 10 x 2___ times. Band color __red theraband____   Side Pull: Double Arm   On back, knees bent, feet flat. Arms perpendicular to body, shoulder level,  elbows straight but relaxed. Pull arms out to sides, elbows straight. Resistance band comes across collarbones, hands toward floor. Hold momentarily. Slowly return to starting position. Repeat _2 x 10__ times. Band color ____red_   Lindsay [name]   On back, knees bent, feet flat, left hand on left hip, right hand above left. Pull right arm DIAGONALLY (hip to shoulder) across chest. Bring right arm along head toward floor. Hold momentarily. Slowly return to starting position. Repeat _10 x2__ times. Do with left arm. Band color _red_____   Shoulder Rotation: Double Arm   On back, knees bent, feet flat, elbows tucked at sides, bent 90, hands palms up. Pull hands apart and down toward floor, keeping elbows near sides. Hold momentarily. Slowly return    Red tharaband_____     Copyright  VHI. All rights reserved.   Lindsay [name], PT 07/22/2015 2:40 PM Phone: [phone] Fax: [phone]

## 2015-07-27 ENCOUNTER — Emergency Department (HOSPITAL_COMMUNITY)
Admission: EM | Admit: 2015-07-27 | Discharge: 2015-07-28 | Disposition: A | Discharge status: Home / Self Care | Payer: Medicaid Other | Attending: Emergency Medicine | Admitting: Emergency Medicine

## 2015-07-27 ENCOUNTER — Encounter (HOSPITAL_COMMUNITY): Payer: Self-pay | Admitting: *Deleted

## 2015-07-27 DIAGNOSIS — Z7982 Long term (current) use of aspirin: Secondary | ICD-10-CM | POA: Insufficient documentation

## 2015-07-27 DIAGNOSIS — E119 Type 2 diabetes mellitus without complications: Secondary | ICD-10-CM | POA: Insufficient documentation

## 2015-07-27 DIAGNOSIS — L02212 Cutaneous abscess of back [any part, except buttock]: Secondary | ICD-10-CM | POA: Diagnosis present

## 2015-07-27 DIAGNOSIS — Z794 Long term (current) use of insulin: Secondary | ICD-10-CM | POA: Diagnosis not present

## 2015-07-27 DIAGNOSIS — I10 Essential (primary) hypertension: Secondary | ICD-10-CM | POA: Insufficient documentation

## 2015-07-27 DIAGNOSIS — L0291 Cutaneous abscess, unspecified: Secondary | ICD-10-CM

## 2015-07-27 DIAGNOSIS — J45909 Unspecified asthma, uncomplicated: Secondary | ICD-10-CM | POA: Insufficient documentation

## 2015-07-27 DIAGNOSIS — Z87448 Personal history of other diseases of urinary system: Secondary | ICD-10-CM | POA: Insufficient documentation

## 2015-07-27 DIAGNOSIS — Z79899 Other long term (current) drug therapy: Secondary | ICD-10-CM | POA: Insufficient documentation

## 2015-07-27 MED ORDER — LIDOCAINE-EPINEPHRINE (PF) 2 %-1:200000 IJ SOLN
20.0000 mL | Freq: Once | INTRAMUSCULAR | Status: AC
  Administered 2015-07-27: 20 mL via INTRADERMAL
  Filled 2015-07-27: qty 20

## 2015-07-27 MED ORDER — SODIUM CHLORIDE 0.9 % IV BOLUS (SEPSIS)
1000.0000 mL | Freq: Once | INTRAVENOUS | Status: AC
  Administered 2015-07-27: 1000 mL via INTRAVENOUS

## 2015-07-27 MED ORDER — MORPHINE SULFATE 4 MG/ML IJ SOLN
4.0000 mg | Freq: Once | INTRAMUSCULAR | Status: AC
  Administered 2015-07-27: 4 mg via INTRAMUSCULAR
  Filled 2015-07-27: qty 1

## 2015-07-27 NOTE — ED Provider Notes (Signed)
CSN: [id number]     Arrival date & time 07/27/15  2228 History  This chart was scribed for Catha Gosselin, PA-C, working with Rochele Raring, MD by Elon Spanner, ED Scribe. This patient was seen in room TR09C/TR09C and the patient's care was started at 11:11 PM.   Chief Complaint  Patient presents with  . Abcess    The history is provided by the patient. No language interpreter was used.   HPI Comments: Lindsay [name] is a 34 y.o. female with a history of DM who presents to the Emergency Department complaining of a moderately painful, gradually worsening abscess on the mid back onset yesterday.  The pain is exacerbated by touch/bending over and there has been no drainage from the complaint.  The patient reports a history of abscess in right upper chest in the past that required admission and surgery.  She denies any fever, chills, night sweats.  Past Medical History  Diagnosis Date  . Hypertension   . Asthma   . Obesity   . Cellulitis of right upper extremity   . ARF (acute renal failure) 04/2015  . Diabetes mellitus     insulin dependent   Past Surgical History  Procedure Laterality Date  . Cesarean section    . Tonsillectomy    . Leg surgery    . Irrigation and debridement shoulder Right 04/30/2015    Procedure: IRRIGATION AND DEBRIDEMENT SHOULDER;  Surgeon: Sheral Apley, MD;  Location: [facility] OR;  Service: Orthopedics;  Laterality: Right;  . Shoulder arthroscopy Right 04/30/2015    Procedure: ARTHROSCOPY SHOULDER;  Surgeon: Sheral Apley, MD;  Location: [facility] OR;  Service: Orthopedics;  Laterality: Right;  . Irrigation and debridement shoulder Right 05/01/2015   Family History  Problem Relation Age of Onset  . Diabetes Mother   . Hypertension Mother   . Heart disease Mother   . Diabetes Father   . Heart disease Father   . Stroke Maternal Grandmother   . Cancer Maternal Grandmother    History  Substance Use Topics  . Smoking status: Never Smoker   . Smokeless tobacco:  Never Used  . Alcohol Use: Yes     Comment: socially   OB History    No data available     Review of Systems  Constitutional: Negative for fever and chills.  Gastrointestinal: Negative for nausea and vomiting.  Genitourinary: Negative for difficulty urinating.  Musculoskeletal: Positive for back pain. Negative for joint swelling and neck pain.  Skin: Positive for wound.  Neurological: Negative for weakness and numbness.  All other systems reviewed and are negative.     Allergies  Review of patient's allergies indicates no known allergies.  Home Medications   Prior to Admission medications   Medication Sig Start Date End Date Taking? Authorizing Provider  aspirin 81 MG tablet Take 81 mg by mouth daily.    Historical Provider, MD  baclofen (LIORESAL) 10 MG tablet Take 1 tablet (10 mg total) by mouth 3 (three) times daily. 06/25/15   Doris Cheadle, MD  cephALEXin (KEFLEX) 500 MG capsule Take 1 capsule (500 mg total) by mouth 4 (four) times daily. 07/28/15   Rehanna Oloughlin Patel-Mills, PA-C  ferrous sulfate 325 (65 FE) MG tablet Take 1 tablet (325 mg total) by mouth 2 (two) times daily with a meal. 05/13/15   Zannie Cove, MD  fluconazole (DIFLUCAN) 150 MG tablet Take 1 tablet (150 mg total) by mouth once. 06/25/15   Doris Cheadle, MD  gabapentin (NEURONTIN) 100 MG  capsule Take 1 capsule (100 mg total) by mouth 2 (two) times daily. 05/14/15   Zannie Cove, MD  insulin aspart (NOVOLOG) 100 UNIT/ML FlexPen Inject 10 Units into the skin 3 (three) times daily with meals. 05/14/15   Zannie Cove, MD  insulin glargine (LANTUS) 100 unit/mL SOPN Inject 0.65 mLs (65 Units total) into the skin at bedtime. 02/13/15   Doris Cheadle, MD  lisinopril (PRINIVIL,ZESTRIL) 10 MG tablet Take 1 tablet (10 mg total) by mouth daily. 06/25/15   Doris Cheadle, MD  oxyCODONE-acetaminophen (PERCOCET) 5-325 MG per tablet Take 1-2 tablets by mouth every 4 (four) hours as needed for moderate pain. 05/17/15   Penny Pia, MD   polyethylene glycol (MIRALAX / GLYCOLAX) packet Take 17 g by mouth daily. Patient not taking: Reported on 06/12/2015 05/14/15   Zannie Cove, MD  sulfamethoxazole-trimethoprim (BACTRIM DS,SEPTRA DS) 800-160 MG per tablet Take 1 tablet by mouth 2 (two) times daily. 07/28/15 08/04/15  Aaleigha Bozza Patel-Mills, PA-C  Vitamin D, Ergocalciferol, (DRISDOL) 50000 UNITS CAPS capsule Take 1 capsule (50,000 Units total) by mouth every 7 (seven) days. 07/18/14   Doris Cheadle, MD   BP 153/92 mmHg  Pulse 99  Temp(Src) 98.7 F (37.1 C) (Oral)  Resp 18  Ht 5\' 8"  (1.727 m)  Wt 329 lb (149.233 kg)  BMI 50.04 kg/m2  SpO2 100%  LMP 07/08/2015 Physical Exam  Constitutional: She is oriented to person, place, and time. She appears well-developed and well-nourished. No distress.  HENT:  Head: Normocephalic and atraumatic.  Eyes: Conjunctivae and EOM are normal.  Neck: Normal range of motion. Neck supple. No tracheal deviation present.  Cardiovascular: Normal rate, regular rhythm and normal heart sounds.   Pulmonary/Chest: Effort normal. No respiratory distress.  Abdominal: Soft. There is no tenderness.  Morbidly obese.  Musculoskeletal: Normal range of motion.  Neurological: She is alert and oriented to person, place, and time.  Skin: Skin is warm and dry.  Large 10 cm in diameter indurated abscess along the mid back. No surrounding erythema or edema. There is tenderness to palpation but no fluctuance or active drainage. There is a head on the wound.  Psychiatric: She has a normal mood and affect. Her behavior is normal.  Nursing note and vitals reviewed.   ED Course  Procedures (including critical care time)  DIAGNOSTIC STUDIES: Oxygen Saturation is 97% on RA, normal by my interpretation.    COORDINATION OF CARE:  11:18 PM Discussed treatment plan with patient at bedside.  Patient acknowledges and agrees with plan.    INCISION AND DRAINAGE PROCEDURE NOTE: Patient identification was confirmed and verbal  consent was obtained. This procedure was performed by Catha Gosselin, PA-C, at 1:49 AM  Site: Mid back Sterile procedures observed Needle size: 25 Anesthetic used (type and amt):  2% lidocaine with epinephrine Blade size: 11 Drainage: Minimal amounts of bloody drainage. No mucopurulent drainage. Complexity: Simple Packing used: None Site anesthetized, incision made over site, wound drained and explored loculations, rinsed with copious amounts of normal saline, covered with dry, sterile dressing.  Pt tolerated procedure well without complications.  Instructions for care discussed verbally and pt provided with additional written instructions for homecare and f/u.   Labs Review Labs Reviewed  CBC WITH DIFFERENTIAL/PLATELET - Abnormal; Notable for the following:    WBC 12.9 (*)    Hemoglobin 11.8 (*)    HCT 35.1 (*)    MCV 76.3 (*)    MCH 25.7 (*)    Neutro Abs 8.6 (*)  Monocytes Absolute 1.2 (*)    All other components within normal limits  BASIC METABOLIC PANEL - Abnormal; Notable for the following:    Sodium 133 (*)    Chloride 100 (*)    Glucose, Bld 318 (*)    All other components within normal limits  I-STAT CG4 LACTIC ACID, ED  I-STAT CG4 LACTIC ACID, ED    Imaging Review Dg Thoracic Spine 2 View  07/28/2015   CLINICAL DATA:  Posterior mid thoracic spine abscess. History of diabetes, acute renal failure, obesity.  EXAM: THORACIC SPINE 2 VIEWS  COMPARISON:  None.  FINDINGS: There is no evidence of thoracic spine fracture. Alignment is normal. No other significant bone abnormalities are identified. Large body habitus.  IMPRESSION: No acute osseous process.  Soft tissue pathology would be better characterized on cross-sectional imaging.   Electronically Signed   By: Awilda Metro M.D.   On: 07/28/2015 01:10     EKG Interpretation None      MDM   Final diagnoses:  Abscess   Patient presents for mid back abscess since yesterday. She is afebrile and vitals  are stable. Thoracic spine x-ray is negative for any osteomyelitis. She has a elevated WBC of 13 but otherwise her labs are not concerning. She is immunocompromised due to diabetes. Lactic acid is 1.12. I prescribed Bactrim and Keflex for the abscess since minimal drainage could produced from I&D. Medications  lidocaine-EPINEPHrine (XYLOCAINE W/EPI) 2 %-1:200000 (PF) injection 20 mL (20 mLs Intradermal Given 07/27/15 2335)  sodium chloride 0.9 % bolus 1,000 mL (0 mLs Intravenous Stopped 07/28/15 0056)  morphine 4 MG/ML injection 4 mg (4 mg Intramuscular Given 07/27/15 2355)  cephALEXin (KEFLEX) capsule 500 mg (500 mg Oral Given 07/28/15 0053)  sulfamethoxazole-trimethoprim (BACTRIM DS,SEPTRA DS) 800-160 MG per tablet 1 tablet (1 tablet Oral Given 07/28/15 0053)   I gave the patient strict return precautions and recheck in 36 hours. I explained that if she were to get a fever and had no improvement that she would need to return. I also discussed controlling her blood sugar since she was hyperglycemic today. Patient verbally agreed with the plan.  I personally performed the services described in this documentation, which was scribed in my presence. The recorded information has been reviewed and is accurate.   Catha Gosselin, PA-C 07/28/15 0150  Layla Maw Ward, DO 07/28/15 8416

## 2015-07-27 NOTE — ED Notes (Signed)
Patient presents with abcess to the mid center of her back

## 2015-07-28 ENCOUNTER — Emergency Department (HOSPITAL_COMMUNITY): Payer: Medicaid Other

## 2015-07-28 LAB — BASIC METABOLIC PANEL
ANION GAP: 11 (ref 5–15)
BUN: 6 mg/dL (ref 6–20)
CO2: 22 mmol/L (ref 22–32)
Calcium: 9.1 mg/dL (ref 8.9–10.3)
Chloride: 100 mmol/L — ABNORMAL LOW (ref 101–111)
Creatinine, Ser: 0.74 mg/dL (ref 0.44–1.00)
GFR calc non Af Amer: >60 mL/min (ref >60)
Glucose, Bld: 318 mg/dL — ABNORMAL HIGH (ref 65–99)
POTASSIUM: 3.9 mmol/L (ref 3.5–5.1)
SODIUM: 133 mmol/L — AB (ref 135–145)

## 2015-07-28 LAB — CBC WITH DIFFERENTIAL/PLATELET
Basophils Absolute: 0 10*3/uL (ref 0.0–0.1)
Basophils Relative: 0 % (ref 0–1)
EOS PCT: 1 % (ref 0–5)
Eosinophils Absolute: 0.2 10*3/uL (ref 0.0–0.7)
HCT: 35.1 % — ABNORMAL LOW (ref 36.0–46.0)
Hemoglobin: 11.8 g/dL — ABNORMAL LOW (ref 12.0–15.0)
LYMPHS PCT: 23 % (ref 12–46)
Lymphs Abs: 2.9 10*3/uL (ref 0.7–4.0)
MCH: 25.7 pg — ABNORMAL LOW (ref 26.0–34.0)
MCHC: 33.6 g/dL (ref 30.0–36.0)
MCV: 76.3 fL — AB (ref 78.0–100.0)
MONO ABS: 1.2 10*3/uL — AB (ref 0.1–1.0)
Monocytes Relative: 10 % (ref 3–12)
Neutro Abs: 8.6 10*3/uL — ABNORMAL HIGH (ref 1.7–7.7)
Neutrophils Relative %: 67 % (ref 43–77)
PLATELETS: 208 10*3/uL (ref 150–400)
RBC: 4.6 MIL/uL (ref 3.87–5.11)
RDW: 15.1 % (ref 11.5–15.5)
WBC: 12.9 10*3/uL — ABNORMAL HIGH (ref 4.0–10.5)

## 2015-07-28 LAB — I-STAT CG4 LACTIC ACID, ED: LACTIC ACID, VENOUS: 1.12 mmol/L (ref 0.5–2.0)

## 2015-07-28 MED ORDER — SULFAMETHOXAZOLE-TRIMETHOPRIM 800-160 MG PO TABS: 1.0000 | ORAL_TABLET | Freq: Two times a day (BID) | ORAL | Status: AC

## 2015-07-28 MED ORDER — SULFAMETHOXAZOLE-TRIMETHOPRIM 800-160 MG PO TABS
1.0000 | ORAL_TABLET | Freq: Once | ORAL | Status: AC
  Administered 2015-07-28: 1 via ORAL
  Filled 2015-07-28: qty 1

## 2015-07-28 MED ORDER — CEPHALEXIN 500 MG PO CAPS: 500.0000 mg | ORAL_CAPSULE | Freq: Four times a day (QID) | ORAL | Status: DC

## 2015-07-28 MED ORDER — CEPHALEXIN 250 MG PO CAPS
500.0000 mg | ORAL_CAPSULE | Freq: Once | ORAL | Status: AC
  Administered 2015-07-28: 500 mg via ORAL
  Filled 2015-07-28: qty 2

## 2015-07-28 NOTE — ED Notes (Signed)
Taken to xray at this time. 

## 2015-07-28 NOTE — Discharge Instructions (Signed)
Abscess Take anabiotic says prescribed. Return if no improved provement in 36 hours or for increased redness/pain/fever. Apply warm compresses to the area several times a day. An abscess (boil or furuncle) is an infected area on or under the skin. This area is filled with yellowish-white fluid (pus) and other material (debris). HOME CARE   Only take medicines as told by your doctor.  If you were given antibiotic medicine, take it as directed. Finish the medicine even if you start to feel better.  If gauze is used, follow your doctor's directions for changing the gauze.  To avoid spreading the infection:  Keep your abscess covered with a bandage.  Wash your hands well.  Do not share personal care items, towels, or whirlpools with others.  Avoid skin contact with others.  Keep your skin and clothes clean around the abscess.  Keep all doctor visits as told. GET HELP RIGHT AWAY IF:   You have more pain, puffiness (swelling), or redness in the wound site.  You have more fluid or blood coming from the wound site.  You have muscle aches, chills, or you feel sick.  You have a fever. MAKE SURE YOU:   Understand these instructions.  Will watch your condition.  Will get help right away if you are not doing well or get worse. Document Released: 05/25/2008 Document Revised: 06/07/2012 Document Reviewed: 02/19/2012 [facility] Patient Information 2015 [place], [place]. This information is not intended to replace advice given to you by your health care provider. Make sure you discuss any questions you have with your health care provider.

## 2015-07-29 ENCOUNTER — Ambulatory Visit: Payer: Medicaid Other | Admitting: Physical Therapy

## 2015-07-29 DIAGNOSIS — R531 Weakness: Secondary | ICD-10-CM

## 2015-07-29 DIAGNOSIS — M25511 Pain in right shoulder: Secondary | ICD-10-CM

## 2015-07-29 DIAGNOSIS — R293 Abnormal posture: Secondary | ICD-10-CM

## 2015-07-29 DIAGNOSIS — R6889 Other general symptoms and signs: Secondary | ICD-10-CM

## 2015-07-29 NOTE — Therapy (Signed)
[facility] [address] [place], [place], [address] Phone: [phone]   Fax:  [phone]  Physical Therapy Treatment  Patient Details  Name: Lindsay [name] MRN: [id number] Date of Birth: October 29, 1981 Referring Provider:  Doris Cheadle, MD  Encounter Date: 07/29/2015      PT End of Session - 07/29/15 1335    Visit Number 5   Number of Visits 16   Date for PT Re-Evaluation 08/26/15   Authorization Type [employer]   Authorization Time Period 08-26-15   PT Start Time 0131   PT Stop Time 0235   PT Time Calculation (min) 64 min      Past Medical History  Diagnosis Date  . Hypertension   . Asthma   . Obesity   . Cellulitis of right upper extremity   . ARF (acute renal failure) 04/2015  . Diabetes mellitus     insulin dependent    Past Surgical History  Procedure Laterality Date  . Cesarean section    . Tonsillectomy    . Leg surgery    . Irrigation and debridement shoulder Right 04/30/2015    Procedure: IRRIGATION AND DEBRIDEMENT SHOULDER;  Surgeon: Sheral Apley, MD;  Location: [facility] OR;  Service: Orthopedics;  Laterality: Right;  . Shoulder arthroscopy Right 04/30/2015    Procedure: ARTHROSCOPY SHOULDER;  Surgeon: Sheral Apley, MD;  Location: [facility] OR;  Service: Orthopedics;  Laterality: Right;  . Irrigation and debridement shoulder Right 05/01/2015    There were no vitals filed for this visit.  Visit Diagnosis:  Abnormal posture  Generalized weakness  Pain in right shoulder  Activity intolerance      Subjective Assessment - 07/29/15 1334    Subjective Its not a constant pain anymore.    Currently in Pain? No/denies  up to 8-9/10   Aggravating Factors  reach behind back   Pain Relieving Factors heat/ice, massage            [facility] PT Assessment - 07/29/15 1410    AROM   Right Shoulder Flexion 120 Degrees   Right Shoulder ABduction 120 Degrees   Right Shoulder Internal Rotation --  reach  to right posterior buttock before, L-1 after tx   Right Shoulder External Rotation --  reach to C6                     [facility] Adult PT Treatment/Exercise - 07/29/15 1342    Shoulder Exercises: Supine   Other Supine Exercises supine scap with red t band for flex ,  Horiz abd and diagonals 10 x each   Shoulder Exercises: Standing   Other Standing Exercises UE ranger for flex, horiz ab/add, IR step side to side, cane for IR, clasp behind back stretch for IR 2 x 30   Shoulder Exercises: ROM/Strengthening   UBE (Upper Arm Bike) Level 1 x 5 min forward and back with cues for scapular depression and retraction   Moist Heat Therapy   Number Minutes Moist Heat 15 Minutes   Moist Heat Location Shoulder  R mid humerus   Electrical Stimulation   Electrical Stimulation Location right should   Electrical Stimulation Action IFC   Electrical Stimulation Parameters 15   Electrical Stimulation Goals Pain                  PT Short Term Goals - 07/22/15 1740    PT SHORT TERM GOAL #1   Title "Independent with initial HEP  Time 4   Period Weeks   Status On-going   PT SHORT TERM GOAL #2   Title Pt will report pain to decrease to 4-5/10 with work duties   Time 4   Period Weeks   Status On-going   PT SHORT TERM GOAL #3   Title Pt will be able to flex R shoulder past 90 degrees for ease of performing ADL's with 50% reduced pain   Baseline AROM 120 but pain at 8/10    Time 4   Period Weeks   Status On-going   PT SHORT TERM GOAL #4   Title "Demonstrate and verbalize understanding of condition management including RICE,heat, positioning, HEP,    Time 4   Period Weeks   Status Achieved           PT Long Term Goals - 07/12/15 1127    PT LONG TERM GOAL #1   Title "Pt will be independent with advanced HEP.    Time 8   Period Weeks   Status On-going   PT LONG TERM GOAL #2   Title "Pain will decrease to 1/10 with all functional activities   Time 8   Period Weeks    Status On-going   PT LONG TERM GOAL #3   Title "R shoulder AROM scaption will improve to 0-140 degrees for improved overhead reaching.    Time 8   Period Weeks   Status On-going   PT LONG TERM GOAL #4   Title "FOTO will improve from 58% limitation    to  33%liimitation   indicating improved functional mobility     Time 8   Period Weeks   Status On-going               Plan - 07/29/15 1438    Clinical Impression Statement Pt enters clinic with no pain. She reports pain is now intermittent and can get up to 8-9/10. Limited IR at beginning of treamtent, improved to reaching to L1 after treatment.    PT Next Visit Plan ROCK TOOL TO DELTOIDS NEXT VISIT; Continue pain releif AAROM/AROM , soft tissue and modalities for pain. progress supine scap and review ex already given, pt post surgery for septic shoulder jt.  Must be seen 2 x a week.  Pt put on waiting list. to get appt in.        Problem List Patient Active Problem List   Diagnosis Date Noted  . Pressure ulcer 05/17/2015  . Weakness 05/16/2015  . Generalized weakness 05/15/2015  . Anaerobic abscess   . Cellulitis of right upper arm   . ARF (acute renal failure)   . Septic arthritis   . HTN (hypertension) 04/30/2015  . Cellulitis 04/30/2015  . Hyponatremia 04/30/2015  . Abscess   . Abscess of right shoulder   . Diabetes mellitus due to underlying condition without complications 07/16/2014  . Obesity 07/16/2014  . Cellulitis of left upper extremity 07/16/2014    Sherrie Mustache, PTA 07/29/2015, 2:41 PM  [facility] [address] [place], [place], [address] Phone: [phone]   Fax:  [phone]

## 2015-07-31 ENCOUNTER — Ambulatory Visit: Payer: Medicaid Other | Admitting: Physical Therapy

## 2015-07-31 DIAGNOSIS — M25511 Pain in right shoulder: Secondary | ICD-10-CM

## 2015-07-31 DIAGNOSIS — R293 Abnormal posture: Secondary | ICD-10-CM

## 2015-07-31 DIAGNOSIS — R531 Weakness: Secondary | ICD-10-CM

## 2015-07-31 DIAGNOSIS — R6889 Other general symptoms and signs: Secondary | ICD-10-CM

## 2015-07-31 NOTE — Therapy (Signed)
[facility] [address] [place], [place], [address] Phone: [phone]   Fax:  [phone]  Physical Therapy Treatment  Patient Details  Name: Lindsay [name] MRN: [id number] Date of Birth: Mar 04, 1981 Referring Provider:  Doris Cheadle, MD  Encounter Date: 07/31/2015      PT End of Session - 07/31/15 1455    Visit Number 6   Number of Visits 16   Date for PT Re-Evaluation 08/26/15   Authorization Type [employer]   Authorization Time Period 08-26-15   PT Start Time 0131   PT Stop Time 0232   PT Time Calculation (min) 61 min      Past Medical History  Diagnosis Date  . Hypertension   . Asthma   . Obesity   . Cellulitis of right upper extremity   . ARF (acute renal failure) 04/2015  . Diabetes mellitus     insulin dependent    Past Surgical History  Procedure Laterality Date  . Cesarean section    . Tonsillectomy    . Leg surgery    . Irrigation and debridement shoulder Right 04/30/2015    Procedure: IRRIGATION AND DEBRIDEMENT SHOULDER;  Surgeon: Sheral Apley, MD;  Location: [facility] OR;  Service: Orthopedics;  Laterality: Right;  . Shoulder arthroscopy Right 04/30/2015    Procedure: ARTHROSCOPY SHOULDER;  Surgeon: Sheral Apley, MD;  Location: [facility] OR;  Service: Orthopedics;  Laterality: Right;  . Irrigation and debridement shoulder Right 05/01/2015    There were no vitals filed for this visit.  Visit Diagnosis:  Abnormal posture  Generalized weakness  Pain in right shoulder  Activity intolerance      Subjective Assessment - 07/31/15 1338    Subjective Increased soreness after last visit. I went to the gym and satin the steam room and sauna so help  the pain.    Currently in Pain? Yes   Pain Score 7    Pain Location Shoulder   Pain Orientation Right                         [facility] Adult PT Treatment/Exercise - 07/31/15 0001    Shoulder Exercises: Standing   Other Standing  Exercises UE ranger for flex, horiz ab/add, IR step side to side, extension with step forward/back    Shoulder Exercises: ROM/Strengthening   UBE (Upper Arm Bike) Level 1 x 5 min forward and back with cues for scapular depression and retraction   Shoulder Exercises: Stretch   Wall Stretch - Flexion 2 reps;30 seconds   Moist Heat Therapy   Number Minutes Moist Heat 15 Minutes   Moist Heat Location Shoulder  R mid humerus   Electrical Stimulation   Electrical Stimulation Location right should   Electrical Stimulation Action IFC   Electrical Stimulation Parameters 15   Electrical Stimulation Goals Pain   Manual Therapy   Manual Therapy Soft tissue mobilization;Joint mobilization   Soft tissue mobilization shoulder soft tissue upper trap and RTC, deltoid, forearm                   PT Short Term Goals - 07/22/15 1740    PT SHORT TERM GOAL #1   Title "Independent with initial HEP   Time 4   Period Weeks   Status On-going   PT SHORT TERM GOAL #2   Title Pt will report pain to decrease to 4-5/10 with work duties   Time 4   Period [employer]  Status On-going   PT SHORT TERM GOAL #3   Title Pt will be able to flex R shoulder past 90 degrees for ease of performing ADL's with 50% reduced pain   Baseline AROM 120 but pain at 8/10    Time 4   Period Weeks   Status On-going   PT SHORT TERM GOAL #4   Title "Demonstrate and verbalize understanding of condition management including RICE,heat, positioning, HEP,    Time 4   Period Weeks   Status Achieved           PT Long Term Goals - 07/12/15 1127    PT LONG TERM GOAL #1   Title "Pt will be independent with advanced HEP.    Time 8   Period Weeks   Status On-going   PT LONG TERM GOAL #2   Title "Pain will decrease to 1/10 with all functional activities   Time 8   Period Weeks   Status On-going   PT LONG TERM GOAL #3   Title "R shoulder AROM scaption will improve to 0-140 degrees for improved overhead reaching.    Time 8    Period Weeks   Status On-going   PT LONG TERM GOAL #4   Title "FOTO will improve from 58% limitation    to  33%liimitation   indicating improved functional mobility     Time 8   Period Weeks   Status On-going               Plan - 07/31/15 1450    Clinical Impression Statement Used IASTYM tool to soften musculature in upper arm, shoulder and forearm. Pt reported pain decreased from 7/10 to between 5-6/10 and stated that it felt much better. IR motion was again limited to right lateral hip before treatment and improved to L-2 after treatment.    PT Next Visit Plan ROCK TOOL TO DELTOIDS NEXT VISIT; Continue pain releif AAROM/AROM , soft tissue and modalities for pain. progress supine scap and review ex already given, pt post surgery for septic shoulder jt.  Must be seen 2 x a week.         Problem List Patient Active Problem List   Diagnosis Date Noted  . Pressure ulcer 05/17/2015  . Weakness 05/16/2015  . Generalized weakness 05/15/2015  . Anaerobic abscess   . Cellulitis of right upper arm   . ARF (acute renal failure)   . Septic arthritis   . HTN (hypertension) 04/30/2015  . Cellulitis 04/30/2015  . Hyponatremia 04/30/2015  . Abscess   . Abscess of right shoulder   . Diabetes mellitus due to underlying condition without complications 07/16/2014  . Obesity 07/16/2014  . Cellulitis of left upper extremity 07/16/2014    Sherrie Mustache, PTA 07/31/2015, 2:56 PM  [facility] [address] [place], [place], [address] Phone: [phone]   Fax:  [phone]

## 2015-08-06 ENCOUNTER — Ambulatory Visit: Payer: Medicaid Other | Admitting: Physical Therapy

## 2015-08-06 DIAGNOSIS — M25511 Pain in right shoulder: Secondary | ICD-10-CM

## 2015-08-06 DIAGNOSIS — R531 Weakness: Secondary | ICD-10-CM

## 2015-08-06 DIAGNOSIS — R293 Abnormal posture: Secondary | ICD-10-CM

## 2015-08-06 DIAGNOSIS — R6889 Other general symptoms and signs: Secondary | ICD-10-CM

## 2015-08-06 NOTE — Patient Instructions (Signed)
Over Head Pull: Narrow Grip       On back, knees bent, feet flat, band across thighs, elbows straight but relaxed. Pull hands apart (start). Keeping elbows straight, bring arms up and over head, hands toward floor. Keep pull steady on band. Hold momentarily. Return slowly, keeping pull steady, back to start. Repeat ___ times. Band color ______   Side Pull: Double Arm   On back, knees bent, feet flat. Arms perpendicular to body, shoulder level, elbows straight but relaxed. Pull arms out to sides, elbows straight. Resistance band comes across collarbones, hands toward floor. Hold momentarily. Slowly return to starting position. Repeat ___ times. Band color _____

## 2015-08-06 NOTE — Therapy (Signed)
[facility] [address] [place], [place], [address] Phone: [phone]   Fax:  [phone]  Physical Therapy Treatment  Patient Details  Name: Lindsay [name] MRN: [id number] Date of Birth: May 18, 1981 Referring Provider:  Doris Cheadle, MD  Encounter Date: 08/06/2015      PT End of Session - 08/06/15 1418    Visit Number 7   Number of Visits 16   PT Start Time 1331   PT Stop Time 1431   PT Time Calculation (min) 60 min   Activity Tolerance Patient tolerated treatment well;No increased pain   Behavior During Therapy [facility] for tasks assessed/performed      Past Medical History  Diagnosis Date  . Hypertension   . Asthma   . Obesity   . Cellulitis of right upper extremity   . ARF (acute renal failure) 04/2015  . Diabetes mellitus     insulin dependent    Past Surgical History  Procedure Laterality Date  . Cesarean section    . Tonsillectomy    . Leg surgery    . Irrigation and debridement shoulder Right 04/30/2015    Procedure: IRRIGATION AND DEBRIDEMENT SHOULDER;  Surgeon: Sheral Apley, MD;  Location: [facility] OR;  Service: Orthopedics;  Laterality: Right;  . Shoulder arthroscopy Right 04/30/2015    Procedure: ARTHROSCOPY SHOULDER;  Surgeon: Sheral Apley, MD;  Location: [facility] OR;  Service: Orthopedics;  Laterality: Right;  . Irrigation and debridement shoulder Right 05/01/2015    There were no vitals filed for this visit.  Visit Diagnosis:  Abnormal posture  Generalized weakness  Pain in right shoulder  Activity intolerance      Subjective Assessment - 08/06/15 1338    Subjective Doing her home exercises.  Ready for green bands.  arm ,below elbow, pain gone   Currently in Pain? Yes   Pain Score 7    Pain Location Shoulder   Pain Orientation Right;Posterior;Anterior   Pain Descriptors / Indicators Sore;Sharp  swelling,   Pain Radiating Towards deltiod to joint line   Pain Frequency Constant   Aggravating Factors  reaching behind bach   Pain Relieving Factors pillows, muscle relaxers, icce and heat help , massage.   Multiple Pain Sites No                         [facility] Adult PT Treatment/Exercise - 08/06/15 1344    Shoulder Exercises: Supine   External Rotation 10 reps  green band   Other Supine Exercises wide, narrow and horizontal pull,green bandadded to home ex.   Other Supine Exercises sash green band 10 reps   Moist Heat Therapy   Number Minutes Moist Heat 15 Minutes   Moist Heat Location Shoulder   Electrical Stimulation   Electrical Stimulation Location rt shoulder   Electrical Stimulation Action IFC   Electrical Stimulation Parameters 15   Electrical Stimulation Goals Pain   Manual Therapy   Manual Therapy Soft tissue mobilization   Manual therapy comments Rock tool, manual, tape fan for edema                PT Education - 08/06/15 1418    Education provided Yes   Education Details wide, narrow grip flexion and horizontal abd green band   Person(s) Educated Patient   Methods Explanation;Demonstration;Tactile cues;Verbal cues;Handout   Comprehension Verbalized understanding;Returned demonstration          PT Short Term Goals - 08/06/15 1426  PT SHORT TERM GOAL #1   Baseline does independently   Time 4   Period Weeks   Status Achieved   PT SHORT TERM GOAL #2   Title Pt will report pain to decrease to 4-5/10 with work duties   Baseline Patient is not working   Time 4   Period Weeks   Status Unable to assess   PT SHORT TERM GOAL #3   Title Pt will be able to flex R shoulder past 90 degrees for ease of performing ADL's with 50% reduced pain   Baseline 6/10 after manual   Time 4   Period Weeks   Status On-going   PT SHORT TERM GOAL #4   Title "Demonstrate and verbalize understanding of condition management including RICE,heat, positioning, HEP,    Time 4   Period Weeks   Status Achieved           PT Long Term  Goals - 07/12/15 1127    PT LONG TERM GOAL #1   Title "Pt will be independent with advanced HEP.    Time 8   Period Weeks   Status On-going   PT LONG TERM GOAL #2   Title "Pain will decrease to 1/10 with all functional activities   Time 8   Period Weeks   Status On-going   PT LONG TERM GOAL #3   Title "R shoulder AROM scaption will improve to 0-140 degrees for improved overhead reaching.    Time 8   Period Weeks   Status On-going   PT LONG TERM GOAL #4   Title "FOTO will improve from 58% limitation    to  33%liimitation   indicating improved functional mobility     Time 8   Period Weeks   Status On-going               Plan - 08/06/15 1419    Clinical Impression Statement 6/10 post session prior to modalities.  Progress toward home ex goal.  Patient motivated and adherent wih home ex.        Problem List Patient Active Problem List   Diagnosis Date Noted  . Pressure ulcer 05/17/2015  . Weakness 05/16/2015  . Generalized weakness 05/15/2015  . Anaerobic abscess   . Cellulitis of right upper arm   . ARF (acute renal failure)   . Septic arthritis   . HTN (hypertension) 04/30/2015  . Cellulitis 04/30/2015  . Hyponatremia 04/30/2015  . Abscess   . Abscess of right shoulder   . Diabetes mellitus due to underlying condition without complications 07/16/2014  . Obesity 07/16/2014  . Cellulitis of left upper extremity 07/16/2014    [facility] 08/06/2015, 2:37 PM  [name] [address] [place], [place], [address] Phone: [phone]   Fax:  [phone]     Liz Beach, PTA 08/06/2015 2:37 PM Phone: [phone] Fax: [phone]

## 2015-08-08 ENCOUNTER — Ambulatory Visit: Payer: Medicaid Other | Admitting: Physical Therapy

## 2015-08-12 ENCOUNTER — Ambulatory Visit: Payer: Medicaid Other | Admitting: Physical Therapy

## 2015-08-12 DIAGNOSIS — R531 Weakness: Secondary | ICD-10-CM

## 2015-08-12 DIAGNOSIS — R293 Abnormal posture: Secondary | ICD-10-CM | POA: Diagnosis not present

## 2015-08-12 DIAGNOSIS — R6889 Other general symptoms and signs: Secondary | ICD-10-CM

## 2015-08-12 DIAGNOSIS — M25511 Pain in right shoulder: Secondary | ICD-10-CM

## 2015-08-12 NOTE — Therapy (Signed)
[facility] Outpatient Rehabilitation [facility] [address] [place], [place], [address] Phone: [phone]   Fax:  [phone]  Physical Therapy Treatment  Patient Details  Name: Lindsay [name] MRN: [id number] Date of Birth: 1981/12/03 Referring Provider:  Doris Cheadle, MD  Encounter Date: 08/12/2015      PT End of Session - 08/12/15 1030    Visit Number 8   Number of Visits 16   Date for PT Re-Evaluation 08/26/15   Authorization Type [employer]   Authorization Time Period 08-26-15   PT Start Time 0945   PT Stop Time 1045   PT Time Calculation (min) 60 min   Activity Tolerance Patient tolerated treatment well   Behavior During Therapy [facility] for tasks assessed/performed      Past Medical History  Diagnosis Date  . Hypertension   . Asthma   . Obesity   . Cellulitis of right upper extremity   . ARF (acute renal failure) 04/2015  . Diabetes mellitus     insulin dependent    Past Surgical History  Procedure Laterality Date  . Cesarean section    . Tonsillectomy    . Leg surgery    . Irrigation and debridement shoulder Right 04/30/2015    Procedure: IRRIGATION AND DEBRIDEMENT SHOULDER;  Surgeon: Sheral Apley, MD;  Location: [facility] OR;  Service: Orthopedics;  Laterality: Right;  . Shoulder arthroscopy Right 04/30/2015    Procedure: ARTHROSCOPY SHOULDER;  Surgeon: Sheral Apley, MD;  Location: [facility] OR;  Service: Orthopedics;  Laterality: Right;  . Irrigation and debridement shoulder Right 05/01/2015    There were no vitals filed for this visit.  Visit Diagnosis:  Abnormal posture  Generalized weakness  Pain in right shoulder  Activity intolerance      Subjective Assessment - 08/12/15 0948    Subjective I have been doing the home exercises you asked me to.  I moved some furniture this weekend and my shoulder got tight so I stopped moving. it   Pertinent History Rt shoulder abscess   Limitations Lifting;House hold activities   How long  can you sit comfortably? 2 hours   Diagnostic tests MRI, CT scan    Patient Stated Goals to be free of pain and move my arm better   Currently in Pain? Yes   Pain Score 5    Pain Location Shoulder   Pain Orientation Right   Pain Descriptors / Indicators Sore;Sharp   Pain Type Chronic pain   Pain Radiating Towards deltoid   Pain Onset More than a month ago   Pain Frequency Intermittent   Aggravating Factors  reaching behind            [facility] PT Assessment - 08/12/15 1004    AROM   Right Shoulder Flexion 132 Degrees   Right Shoulder ABduction 129 Degrees   Right Shoulder Internal Rotation --  reach to T-12 after tx   Right Shoulder External Rotation --  reach to T-1 with index finger   Left Shoulder Flexion 160 Degrees   Left Shoulder ABduction 160 Degrees   Left Shoulder Internal Rotation 70 Degrees   Left Shoulder External Rotation 90 Degrees                     [facility] Adult PT Treatment/Exercise - 08/12/15 1010    Shoulder Exercises: Standing   Horizontal ABduction Both;10 reps;Theraband  x2   Theraband Level (Shoulder Horizontal ABduction) Level 2 (Red)   Extension Both;10 reps;Theraband  x2   Theraband Level (Shoulder Extension) Level 2 (Red)   Row Both;10 reps  x2   Theraband Level (Shoulder Row) Level 2 (Red)   Other Standing Exercises horiz add  with right side for stretch 30 seconds.     Other Standing Exercises wall clock and forward flex with wall and LE stepping. 15 x  also standing ER with body turn and towel stretch for IR with 2 -3 x with 30 to 60 sec stretch   Moist Heat Therapy   Number Minutes Moist Heat 15 Minutes   Moist Heat Location Shoulder  upper back   Manual Therapy   Manual Therapy Soft tissue mobilization  to right upper trap and deltoid/pec major   Manual therapy comments Rock tool, manual, tape fan for edema   Soft tissue mobilization to Right deltoid , uppet trap and pec major with IASTYM tool   Neck Exercises: Stretches    Upper Trapezius Stretch 2 reps;30 seconds  right side   Levator Stretch 2 reps;30 seconds  right side          Trigger Point Dry Needling - 08/12/15 1011    Consent Given? Yes   Education Handout Provided Yes   Muscles Treated Upper Body Pectoralis major;Upper trapezius  deltoid right only for dry needling all muscles   Upper Trapezius Response Twitch reponse elicited;Palpable increased muscle length   Pectoralis Major Response Twitch response elicited;Palpable increased muscle length    right side only for dry needling           PT Education - 08/12/15 1009    Education provided Yes   Education Details [employer] and aftercare for dry needling and attached standing T band.   Person(s) Educated Patient   Methods Explanation;Demonstration;Tactile cues;Verbal cues;Handout   Comprehension Verbalized understanding;Returned demonstration          PT Short Term Goals - 08/12/15 1035    PT SHORT TERM GOAL #1   Title "Independent with initial HEP   Baseline does independently   Time 4   Period Weeks   Status Achieved   PT SHORT TERM GOAL #2   Title Pt will report pain to decrease to 4-5/10 with work duties   Baseline Pt reports 04/25/09   Time 4   Period Weeks   Status On-going   PT SHORT TERM GOAL #3   Title Pt will be able to flex R shoulder past 90 degrees for ease of performing ADL's with 50% reduced pain   Baseline 410 after manual and dry needling   Time 4   Period Weeks   Status Achieved   PT SHORT TERM GOAL #4   Title "Demonstrate and verbalize understanding of condition management including RICE,heat, positioning, HEP,    Time 4   Period Weeks   Status Achieved           PT Long Term Goals - 08/12/15 1039    PT LONG TERM GOAL #1   Title "Pt will be independent with advanced HEP.    Time 8   Period Weeks   Status On-going   PT LONG TERM GOAL #2   Title "Pain will decrease to 1/10 with all functional activities   Baseline Pt 5/10 with  activities.  After treatment today 2/10   Time 8   Period Weeks   Status On-going   PT LONG TERM GOAL #3   Title "R shoulder AROM scaption will improve to 0-140 degrees for improved overhead reaching.    Baseline  Pt shoulder scaption 135 today after treament   Time 8   Period Weeks   Status On-going   PT LONG TERM GOAL #4   Title "FOTO will improve from 58% limitation    to  33%liimitation   indicating improved functional mobility     Time 8   Period Weeks   Status Unable to assess   PT LONG TERM GOAL #5   Title Pt will be able to perform ADL's with sufficient ER and IR without exacerbating pain to complete activity   Time 8   Period Weeks   Status On-going               Plan - 08/12/15 1031    Clinical Impression Statement Pt 5/10 over ant shoulder(ant deltoid) , upper trap 5/10  before treatment especially with end range stretch.  Pt received trigger point dry needling and soft tissue mobilization with IASTYM tool.  Pain 2/10 post treament with  shoulder flex improved to  135 abd 129 with decreased pain on end range.    Pt will benefit from skilled therapeutic intervention in order to improve on the following deficits Pain;Impaired UE functional use;Increased fascial restricitons;Increased muscle spasms;Impaired perceived functional ability;Decreased scar mobility;Decreased strength;Decreased range of motion;Postural dysfunction;Increased edema   Rehab Potential Good   PT Frequency 2x / week   PT Duration 8 weeks   PT Treatment/Interventions ADLs/Self Care Home Management;Cryotherapy;Electrical Stimulation;Iontophoresis /ml Dexamethasone;Moist Heat;Ultrasound;Therapeutic exercise;Patient/family education;Manual techniques;Taping;Dry needling;Passive range of motion;Scar mobilization;Vasopneumatic Device   PT Next Visit Plan assess dry needling , progess with shoulder strength.  review standing scap ex with tband and add standing ER andprogress. must be seen 2 x a week   PT  Home Exercise Plan wall clock, sleeper stretch for ant and post capsule, supine scap flex, abd and diagonal. standing scap with t band red        Problem List Patient Active Problem List   Diagnosis Date Noted  . Pressure ulcer 05/17/2015  . Weakness 05/16/2015  . Generalized weakness 05/15/2015  . Anaerobic abscess   . Cellulitis of right upper arm   . ARF (acute renal failure)   . Septic arthritis   . HTN (hypertension) 04/30/2015  . Cellulitis 04/30/2015  . Hyponatremia 04/30/2015  . Abscess   . Abscess of right shoulder   . Diabetes mellitus due to underlying condition without complications 07/16/2014  . Obesity 07/16/2014  . Cellulitis of left upper extremity 07/16/2014   Garen Lah, PT 08/12/2015 10:51 AM Phone: [phone] Fax: [phone]  [facility] [address] [place], [place], [address] Phone: [phone]   Fax:  [phone]

## 2015-08-12 NOTE — Patient Instructions (Addendum)
EXTENSION: Standing - Resistance Band: Stable (Active)   Stand, right arm at side. Against yellow resistance band, draw arm backward, as far as possible, keeping elbow straight. Complete __3_ sets of __10_ repetitions. Perform _2__ sessions per day.  Remember to keep shoulder set. No shoulders around the ear.  Copyright  VHI. All rights reserved.  Row: Mid-Range - Standing   With yellow band anchored at chest level, pull elbows backward, squeezing shoulder blades together. Keep head and spine neutral. Row __3 x10_ times, _2__ times per day.  http://ss.exer.us/290   Copyright  VHI. All rights reserved.  Resistive Band Rowing   With resistive band anchored in door, grasp both ends. Keeping elbows bent, pull back, squeezing shoulder blades together. Hold 3-5 ____ seconds. Repeat _3 x 10___ times. Do _2___ sessions per day.  http://gt2.exer.us/97   Copyright  VHI. All rights reserved.  Trigger Point Dry Needling  . What is Trigger Point Dry Needling (DN)? o DN is a physical therapy technique used to treat muscle pain and dysfunction. Specifically, DN helps deactivate muscle trigger points (muscle knots).  o A thin filiform needle is used to penetrate the skin and stimulate the underlying trigger point. The goal is for a local twitch response (LTR) to occur and for the trigger point to relax. No medication of any kind is injected during the procedure.   . What Does Trigger Point Dry Needling Feel Like?  o The procedure feels different for each individual patient. Some patients report that they do not actually feel the needle enter the skin and overall the process is not painful. Very mild bleeding may occur. However, many patients feel a deep cramping in the muscle in which the needle was inserted. This is the local twitch response.   Marland Kitchen How Will I feel after the treatment? o Soreness is normal, and the onset of soreness may not occur for a few hours. Typically this soreness does not  last longer than two days.  o Bruising is uncommon, however; ice can be used to decrease any possible bruising.  o In rare cases feeling tired or nauseous after the treatment is normal. In addition, your symptoms may get worse before they get better, this period will typically not last longer than 24 hours.   . What Can I do After My Treatment? o Increase your hydration by drinking more water for the next 24 hours. o You may place ice or heat on the areas treated that have become sore, however, do not use heat on inflamed or bruised areas. Heat often brings more relief post needling. o You can continue your regular activities, but vigorous activity is not recommended initially after the treatment for 24 hours. o DN is best combined with other physical therapy such as strengthening, stretching, and other therapies.   Garen Lah, PT 08/12/2015 10:02 AM Phone: [phone] Fax: [phone]

## 2015-08-15 ENCOUNTER — Ambulatory Visit: Payer: Medicaid Other | Admitting: Physical Therapy

## 2015-08-15 DIAGNOSIS — M25511 Pain in right shoulder: Secondary | ICD-10-CM

## 2015-08-15 DIAGNOSIS — R531 Weakness: Secondary | ICD-10-CM

## 2015-08-15 DIAGNOSIS — R293 Abnormal posture: Secondary | ICD-10-CM

## 2015-08-15 DIAGNOSIS — R6889 Other general symptoms and signs: Secondary | ICD-10-CM

## 2015-08-15 NOTE — Therapy (Signed)
[facility] [address] [place], [place], [address] Phone: [phone]   Fax:  [phone]  Physical Therapy Treatment  Patient Details  Name: Lindsay [name] MRN: [id number] Date of Birth: 09-Sep-1981 Referring Provider:  Doris Cheadle, MD  Encounter Date: 08/15/2015      PT End of Session - 08/15/15 1248    Visit Number 9   Date for PT Re-Evaluation 08/26/15   Authorization Type [employer]   Authorization Time Period 08-26-15   PT Start Time 1140   PT Stop Time 1246   PT Time Calculation (min) 66 min   Activity Tolerance Patient tolerated treatment well   Behavior During Therapy [facility] for tasks assessed/performed      Past Medical History  Diagnosis Date  . Hypertension   . Asthma   . Obesity   . Cellulitis of right upper extremity   . ARF (acute renal failure) 04/2015  . Diabetes mellitus     insulin dependent    Past Surgical History  Procedure Laterality Date  . Cesarean section    . Tonsillectomy    . Leg surgery    . Irrigation and debridement shoulder Right 04/30/2015    Procedure: IRRIGATION AND DEBRIDEMENT SHOULDER;  Surgeon: Sheral Apley, MD;  Location: [facility] OR;  Service: Orthopedics;  Laterality: Right;  . Shoulder arthroscopy Right 04/30/2015    Procedure: ARTHROSCOPY SHOULDER;  Surgeon: Sheral Apley, MD;  Location: [facility] OR;  Service: Orthopedics;  Laterality: Right;  . Irrigation and debridement shoulder Right 05/01/2015    There were no vitals filed for this visit.  Visit Diagnosis:  Abnormal posture  Generalized weakness  Pain in right shoulder  Activity intolerance      Subjective Assessment - 08/15/15 1144    Subjective I did not have any bruising.     Pertinent History Rt shoulder abscess   Limitations Lifting;House hold activities   Patient Stated Goals to be free of pain and move my arm better   Currently in Pain? Yes   Pain Score 6    Pain Location Shoulder   Pain  Orientation Right   Pain Descriptors / Indicators Sore   Pain Type Chronic pain   Pain Onset More than a month ago   Pain Frequency Intermittent   Aggravating Factors  reaching behind                           [facility] Adult PT Treatment/Exercise - 08/15/15 1243    Shoulder Exercises: Standing   Extension Both;10 reps;Theraband  x2   Theraband Level (Shoulder Extension) Level 2 (Red)   Row Both;10 reps  x2   Theraband Level (Shoulder Row) Level 2 (Red)   Other Standing Exercises wall push up 10 x 2    Other Standing Exercises UE ranger shoulder flex, abd , IR x 10, IR with step to side and added stretch 10 x    Moist Heat Therapy   Number Minutes Moist Heat 15 Minutes   Moist Heat Location Shoulder  upper back   Manual Therapy   Manual Therapy Soft tissue mobilization  to right upper trap and deltoid/pec major/bicep/teres major   Manual therapy comments IASTYM tool   Joint Mobilization R IR/ER, flex grade 3/4 mobs maitland   Soft tissue mobilization right upper trap, deltoid, pec major and biceps/teres major          Trigger Point Dry Needling - 08/15/15 1206  Consent Given? Yes   Education Handout Provided No  previously given   Muscles Treated Upper Body Upper trapezius;Levator scapulae;Pectoralis major   and deltoid and biceps all DN on right side only twitchresp   Upper Trapezius Response Twitch reponse elicited;Palpable increased muscle length   Pectoralis Major Response Twitch response elicited;Palpable increased muscle length   Levator Scapulae Response Twitch response elicited;Palpable increased muscle length              PT Education - 08/15/15 1200    Education provided Yes   Education Details Progressed HEP with towel and wall clock and push up   Person(s) Educated Patient   Methods Explanation;Demonstration;Verbal cues;Handout   Comprehension Verbalized understanding;Returned demonstration          PT Short Term Goals -  08/12/15 1035    PT SHORT TERM GOAL #1   Title "Independent with initial HEP   Baseline does independently   Time 4   Period Weeks   Status Achieved   PT SHORT TERM GOAL #2   Title Pt will report pain to decrease to 4-5/10 with work duties   Baseline Pt reports 04/25/09   Time 4   Period Weeks   Status On-going   PT SHORT TERM GOAL #3   Title Pt will be able to flex R shoulder past 90 degrees for ease of performing ADL's with 50% reduced pain   Baseline 410 after manual and dry needling   Time 4   Period Weeks   Status Achieved   PT SHORT TERM GOAL #4   Title "Demonstrate and verbalize understanding of condition management including RICE,heat, positioning, HEP,    Time 4   Period Weeks   Status Achieved           PT Long Term Goals - 08/12/15 1039    PT LONG TERM GOAL #1   Title "Pt will be independent with advanced HEP.    Time 8   Period Weeks   Status On-going   PT LONG TERM GOAL #2   Title "Pain will decrease to 1/10 with all functional activities   Baseline Pt 5/10 with activities.  After treatment today 2/10   Time 8   Period Weeks   Status On-going   PT LONG TERM GOAL #3   Title "R shoulder AROM scaption will improve to 0-140 degrees for improved overhead reaching.    Baseline Pt shoulder scaption 135 today after treament   Time 8   Period Weeks   Status On-going   PT LONG TERM GOAL #4   Title "FOTO will improve from 58% limitation    to  33%liimitation   indicating improved functional mobility     Time 8   Period Weeks   Status Unable to assess   PT LONG TERM GOAL #5   Title Pt will be able to perform ADL's with sufficient ER and IR without exacerbating pain to complete activity   Time 8   Period Weeks   Status On-going               Plan - 08/15/15 1203    Clinical Impression Statement Pt initiall 6/10 but expresses easier ability to bring R hand behind back without sharp pain.  Just sore.  Pt is 3/10 post treatment   Pt will benefit from  skilled therapeutic intervention in order to improve on the following deficits Pain;Impaired UE functional use;Increased fascial restricitons;Increased muscle spasms;Impaired perceived functional ability;Decreased scar mobility;Decreased strength;Decreased range of motion;Postural  dysfunction;Increased edema   Rehab Potential Good   PT Frequency --   PT Duration --   PT Treatment/Interventions ADLs/Self Care Home Management;Cryotherapy;Electrical Stimulation;Iontophoresis 4mg /ml Dexamethasone;Moist Heat;Ultrasound;Therapeutic exercise;Patient/family education;Manual techniques;Taping;Dry needling;Passive range of motion;Scar mobilization;Vasopneumatic Device   PT Next Visit Plan Progress strengthening  assess goals and AROM FOTO next visit   PT Home Exercise Plan towel wall clock and push ups from wall   Consulted and Agree with Plan of Care Patient        Problem List Patient Active Problem List   Diagnosis Date Noted  . Pressure ulcer 05/17/2015  . Weakness 05/16/2015  . Generalized weakness 05/15/2015  . Anaerobic abscess   . Cellulitis of right upper arm   . ARF (acute renal failure)   . Septic arthritis   . HTN (hypertension) 04/30/2015  . Cellulitis 04/30/2015  . Hyponatremia 04/30/2015  . Abscess   . Abscess of right shoulder   . Diabetes mellitus due to underlying condition without complications 07/16/2014  . Obesity 07/16/2014  . Cellulitis of left upper extremity 07/16/2014   Garen Lah, PT 08/15/2015 12:54 PM Phone: [phone] Fax: [phone]  [facility] [address] [place], [place], [address] Phone: [phone]   Fax:  [phone]

## 2015-08-15 NOTE — Patient Instructions (Signed)
.  wallGymball on Wall: Clockwise / Counterclockwise   Stand _2 __ feet from wall with right hand supporting a ball on the wall. Lean into ball and move ball in circles clockwise. Circle _10__ times. Rest _30__ seconds after set. Do _2__ sets per session.  May use a towel instead of ball  Closed Chain: Wall Push-Off   Stand _2__ feet from wall. Fall toward wall, absorbing impact with arms. Immediately push back to start. Repeat _10 x 2__ times per set. Rest _30__ seconds after set. Do _2__ sets per session.  http://plyo.exer.us/176   Copyright  VHI. All rights reserved.    http://plyo.exer.us/180   Copyright  VHI. All rights reserved.  Garen Lah, PT 08/15/2015 11:55 AM Phone: [phone] Fax: [phone]

## 2015-08-19 ENCOUNTER — Ambulatory Visit: Payer: Medicaid Other | Admitting: Physical Therapy

## 2015-08-19 DIAGNOSIS — R293 Abnormal posture: Secondary | ICD-10-CM | POA: Diagnosis not present

## 2015-08-19 DIAGNOSIS — R6889 Other general symptoms and signs: Secondary | ICD-10-CM

## 2015-08-19 DIAGNOSIS — M25511 Pain in right shoulder: Secondary | ICD-10-CM

## 2015-08-19 DIAGNOSIS — R531 Weakness: Secondary | ICD-10-CM

## 2015-08-19 NOTE — Therapy (Signed)
[place], [place], [address] Phone: [phone]   Fax:  [phone]  Physical Therapy Treatment  Patient Details  Name: Lindsay [name] MRN: [id number] Date of Birth: [birth year] Referring Provider:  Lorayne Marek, MD  Encounter Date: 08/19/2015      PT End of Session - 08/19/15 1152    Visit Number 10   Number of Visits 16   Date for PT Re-Evaluation 08/26/15   Authorization Type [employer]   Authorization Time Period 08-26-15   PT Start Time 1146   PT Stop Time 1243   PT Time Calculation (min) 57 min   Activity Tolerance Patient tolerated treatment well   Behavior During Therapy [facility] for tasks assessed/performed      Past Medical History  Diagnosis Date  . Hypertension   . Asthma   . Obesity   . Cellulitis of right upper extremity   . ARF (acute renal failure) 04/2015  . Diabetes mellitus     insulin dependent    Past Surgical History  Procedure Laterality Date  . Cesarean section    . Tonsillectomy    . Leg surgery    . Irrigation and debridement shoulder Right 04/30/2015    Procedure: IRRIGATION AND DEBRIDEMENT SHOULDER;  Surgeon: Renette Butters, MD;  Location: [place];  Service: Orthopedics;  Laterality: Right;  . Shoulder arthroscopy Right 04/30/2015    Procedure: ARTHROSCOPY SHOULDER;  Surgeon: Renette Butters, MD;  Location: [place];  Service: Orthopedics;  Laterality: Right;  . Irrigation and debridement shoulder Right 05/01/2015    There were no vitals filed for this visit.  Visit Diagnosis:  Abnormal posture  Generalized weakness  Pain in right shoulder  Activity intolerance      Subjective Assessment - 08/19/15 1157    Subjective I now can reach behind my back.  I am able to cook better with my light pots. but cast iron skillet is more difficult.     Pertinent History Rt shoulder abscess   Limitations Lifting;House hold activities   Patient Stated Goals to be  free of pain and move my arm better   Currently in Pain? Yes   Pain Score 5    Pain Location Shoulder   Pain Orientation Right   Pain Descriptors / Indicators Sore   Pain Type Chronic pain   Pain Onset More than a month ago   Pain Frequency Intermittent                        [facility] Adult PT Treatment/Exercise - 08/19/15 1230    Shoulder Exercises: Standing   Other Standing Exercises wall push up 10 x 2    Other Standing Exercises UE ranger shoulder flex, abd , IR x 10, IR with step to side and added stretch 10 x    Moist Heat Therapy   Number Minutes Moist Heat 15 Minutes   Moist Heat Location Shoulder  upper back   Manual Therapy   Manual Therapy Soft tissue mobilization  to right upper trap and deltoid/pec major/bicep/teres major   Manual therapy comments IASTYM tool   Joint Mobilization R IR/ER, flex grade 3/4 mobs maitland   Soft tissue mobilization right upper trap, deltoid ant, middle and post and teres major   Neck Exercises: Stretches   Upper Trapezius Stretch 2 reps;30 seconds  right side   Levator Stretch 2 reps;30 seconds  right side  Trigger Point Dry Needling - 08/19/15 1200    Consent Given? Yes   Education Handout Provided No   Muscles Treated Upper Body Upper trapezius;Levator scapulae;Rhomboids  deltoid ant, middle and post, teres major   Upper Trapezius Response Twitch reponse elicited;Palpable increased muscle length   Levator Scapulae Response Twitch response elicited;Palpable increased muscle length   Rhomboids Response Twitch response elicited;Palpable increased muscle length     DN right side only           PT Short Term Goals - 08/12/15 1035    PT SHORT TERM GOAL #1   Title "Independent with initial HEP   Baseline does independently   Time 4   Period Weeks   Status Achieved   PT SHORT TERM GOAL #2   Title Pt will report pain to decrease to 4-5/10 with work duties   Baseline Pt reports 04/25/09   Time 4    Period Weeks   Status On-going   PT SHORT TERM GOAL #3   Title Pt will be able to flex R shoulder past 90 degrees for ease of performing ADL's with 50% reduced pain   Baseline 410 after manual and dry needling   Time 4   Period Weeks   Status Achieved   PT SHORT TERM GOAL #4   Title "Demonstrate and verbalize understanding of condition management including RICE,heat, positioning, HEP,    Time 4   Period Weeks   Status Achieved           PT Long Term Goals - 08/19/15 1411    PT LONG TERM GOAL #1   Title "Pt will be independent with advanced HEP.    Time 8   Period Weeks   Status On-going   PT LONG TERM GOAL #2   Title "Pain will decrease to 1/10 with all functional activities   Baseline 2-3/10 with activities after stretching   Time 8   Period Weeks   Status On-going   PT LONG TERM GOAL #3   Title "R shoulder AROM scaption will improve to 0-140 degrees for improved overhead reaching.    Time 8   Period Weeks   Status On-going   PT LONG TERM GOAL #4   Title "FOTO will improve from 58% limitation    to  33%liimitation   indicating improved functional mobility     Baseline 43% taken today   Period Weeks   Status Partially Met   PT LONG TERM GOAL #5   Title Pt will be able to perform ADL's with sufficient ER and IR without exacerbating pain to complete activity   Time 8   Period Weeks   Status On-going               Plan - 08/19/15 1408    Clinical Impression Statement Pt is able to bring arm around to low back without pain.  Pt 5/10  reduced to 2-3/10 post treatment.  FOTO reduced from 58 to 43%.  Pt is approaching maximal AROM and will be discharged after next visit after assessment of goals    PT Next Visit Plan Discharge next visit and review HEP and DN as necessary        Problem List Patient Active Problem List   Diagnosis Date Noted  . Pressure ulcer 05/17/2015  . Weakness 05/16/2015  . Generalized weakness 05/15/2015  . Anaerobic abscess   .  Cellulitis of right upper arm   . ARF (acute renal failure)   . Septic  arthritis   . HTN (hypertension) 04/30/2015  . Cellulitis 04/30/2015  . Hyponatremia 04/30/2015  . Abscess   . Abscess of right shoulder   . Diabetes mellitus due to underlying condition without complications 89/38/1017  . Obesity 07/16/2014  . Cellulitis of left upper extremity 07/16/2014    Voncille Lo, PT 08/19/2015 2:13 PM Phone: [phone] Fax: [facility] [address] [place], [place], [address] Phone: [phone]   Fax:  [phone]

## 2015-08-22 ENCOUNTER — Ambulatory Visit: Payer: Medicaid Other | Attending: Orthopedic Surgery | Admitting: Physical Therapy

## 2015-08-22 DIAGNOSIS — R6889 Other general symptoms and signs: Secondary | ICD-10-CM

## 2015-08-22 DIAGNOSIS — R293 Abnormal posture: Secondary | ICD-10-CM

## 2015-08-22 DIAGNOSIS — R531 Weakness: Secondary | ICD-10-CM | POA: Diagnosis present

## 2015-08-22 DIAGNOSIS — M25511 Pain in right shoulder: Secondary | ICD-10-CM | POA: Insufficient documentation
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
## 2015-09-19 ENCOUNTER — Other Ambulatory Visit: Payer: Self-pay | Admitting: Orthopedic Surgery

## 2015-09-19 DIAGNOSIS — M25511 Pain in right shoulder: Secondary | ICD-10-CM

## 2015-09-20 ENCOUNTER — Inpatient Hospital Stay
Admission: RE | Admit: 2015-09-20 | Discharge: 2015-09-20 | Disposition: A | Discharge status: Home / Self Care | Payer: Medicaid Other | Source: Ambulatory Visit | Attending: Orthopedic Surgery | Admitting: Orthopedic Surgery

## 2016-05-04 ENCOUNTER — Telehealth: Payer: Self-pay | Admitting: Internal Medicine

## 2016-05-04 DIAGNOSIS — E139 Other specified diabetes mellitus without complications: Secondary | ICD-10-CM

## 2016-05-04 NOTE — Telephone Encounter (Signed)
Patient is requesting a prescription for Novolog and Lantus....  Please follow up  Patient will be calling on the 30th of this month to schedule appointment to see PCP for June

## 2016-05-05 MED ORDER — INSULIN ASPART 100 UNIT/ML FLEXPEN: 10.0000 [IU] | PEN_INJECTOR | Freq: Three times a day (TID) | SUBCUTANEOUS | Status: DC

## 2016-05-05 MED ORDER — INSULIN GLARGINE 100 UNITS/ML SOLOSTAR PEN: 65.0000 [IU] | PEN_INJECTOR | Freq: Every day | SUBCUTANEOUS | Status: DC

## 2016-05-05 NOTE — Telephone Encounter (Signed)
Lantus and Novolog refilled x 1 month - patient must be seen for future refills.

## 2016-05-19 ENCOUNTER — Telehealth: Payer: Self-pay | Admitting: Internal Medicine

## 2016-05-19 DIAGNOSIS — E139 Other specified diabetes mellitus without complications: Secondary | ICD-10-CM

## 2016-05-19 MED ORDER — INSULIN GLARGINE 100 UNITS/ML SOLOSTAR PEN: 65.0000 [IU] | PEN_INJECTOR | Freq: Every day | SUBCUTANEOUS | Status: DC

## 2016-05-19 MED ORDER — INSULIN ASPART 100 UNIT/ML FLEXPEN: 10.0000 [IU] | PEN_INJECTOR | Freq: Three times a day (TID) | SUBCUTANEOUS | Status: DC

## 2016-05-19 NOTE — Telephone Encounter (Signed)
Patient called requesting medication refill on insulin aspart (NOVOLOG) 100 UNIT/ML FlexPen, insulin glargine (LANTUS) 100 unit/mL SOPN andgabapentin (NEURONTIN) 100 MG capsule( patient states she gets 300mg ) . Patient states she is out of medication. Please f/up

## 2016-05-19 NOTE — Telephone Encounter (Signed)
Refilled Lantus and Novolog - patient must have an office visit next month for any more refills.  Will not fill gabapentin, she was only given this medication as a short term course in May 2016 and it has not been long term. She will need to follow up with physician for a prescription.

## 2016-05-22 MED ORDER — INSULIN GLARGINE 100 UNITS/ML SOLOSTAR PEN: 65.0000 [IU] | PEN_INJECTOR | Freq: Every day | SUBCUTANEOUS | Status: DC

## 2016-05-22 NOTE — Addendum Note (Signed)
Addended by: Juanita Craver A on: 05/22/2016 11:06 AM   Modules accepted: Orders

## 2016-06-02 ENCOUNTER — Other Ambulatory Visit: Payer: Self-pay | Admitting: Pharmacist

## 2016-06-02 DIAGNOSIS — E139 Other specified diabetes mellitus without complications: Secondary | ICD-10-CM

## 2016-06-02 MED ORDER — INSULIN GLARGINE 100 UNITS/ML SOLOSTAR PEN: 65.0000 [IU] | PEN_INJECTOR | Freq: Every day | SUBCUTANEOUS | Status: DC

## 2016-06-03 ENCOUNTER — Telehealth: Payer: Self-pay | Admitting: *Deleted

## 2016-06-03 DIAGNOSIS — E139 Other specified diabetes mellitus without complications: Secondary | ICD-10-CM

## 2016-06-03 NOTE — Telephone Encounter (Signed)
Patient verified DOB Patient is aware of Lantus prescription being placed at the front desk for pick-up. Medication would not e-prescribe.

## 2016-06-10 ENCOUNTER — Encounter: Payer: Self-pay | Admitting: Internal Medicine

## 2016-06-10 ENCOUNTER — Ambulatory Visit: Payer: Medicaid Other | Attending: Internal Medicine | Admitting: Internal Medicine

## 2016-06-10 VITALS — BP 175/111 | HR 88 | Temp 98.5°F | Wt 361.0 lb

## 2016-06-10 DIAGNOSIS — Z6841 Body Mass Index (BMI) 40.0 and over, adult: Secondary | ICD-10-CM | POA: Diagnosis not present

## 2016-06-10 DIAGNOSIS — E089 Diabetes mellitus due to underlying condition without complications: Secondary | ICD-10-CM

## 2016-06-10 DIAGNOSIS — R202 Paresthesia of skin: Secondary | ICD-10-CM

## 2016-06-10 DIAGNOSIS — E1140 Type 2 diabetes mellitus with diabetic neuropathy, unspecified: Secondary | ICD-10-CM

## 2016-06-10 DIAGNOSIS — E119 Type 2 diabetes mellitus without complications: Secondary | ICD-10-CM | POA: Diagnosis not present

## 2016-06-10 DIAGNOSIS — Z79899 Other long term (current) drug therapy: Secondary | ICD-10-CM | POA: Insufficient documentation

## 2016-06-10 DIAGNOSIS — R200 Anesthesia of skin: Secondary | ICD-10-CM

## 2016-06-10 DIAGNOSIS — E139 Other specified diabetes mellitus without complications: Secondary | ICD-10-CM

## 2016-06-10 DIAGNOSIS — Z794 Long term (current) use of insulin: Secondary | ICD-10-CM

## 2016-06-10 DIAGNOSIS — I10 Essential (primary) hypertension: Secondary | ICD-10-CM | POA: Diagnosis not present

## 2016-06-10 DIAGNOSIS — M25511 Pain in right shoulder: Secondary | ICD-10-CM | POA: Diagnosis not present

## 2016-06-10 LAB — POCT URINALYSIS DIP (MANUAL ENTRY)
BILIRUBIN UA: NEGATIVE
Ketones, POC UA: NEGATIVE
Leukocytes, UA: NEGATIVE
NITRITE UA: NEGATIVE
Protein Ur, POC: 100 — AB
SPEC GRAV UA: 1.01
Urobilinogen, UA: 0.2
pH, UA: 5.5

## 2016-06-10 LAB — BASIC METABOLIC PANEL WITH GFR
BUN: 14 mg/dL (ref 7–25)
CALCIUM: 9.9 mg/dL (ref 8.6–10.2)
CO2: 25 mmol/L (ref 20–31)
CREATININE: 0.79 mg/dL (ref 0.50–1.10)
Chloride: 98 mmol/L (ref 98–110)
GFR, Est African American: >89 mL/min (ref >=60)
GFR, Est Non African American: >89 mL/min (ref >=60)
GLUCOSE: 345 mg/dL — AB (ref 65–99)
Potassium: 4.8 mmol/L (ref 3.5–5.3)
SODIUM: 135 mmol/L (ref 135–146)

## 2016-06-10 LAB — CBC WITH DIFFERENTIAL/PLATELET
Basophils Absolute: 64 cells/uL (ref 0–200)
Basophils Relative: 1 %
EOS ABS: 192 {cells}/uL (ref 15–500)
Eosinophils Relative: 3 %
HEMATOCRIT: 40.4 % (ref 35.0–45.0)
HEMOGLOBIN: 13 g/dL (ref 11.7–15.5)
Lymphocytes Relative: 38 %
Lymphs Abs: 2432 cells/uL (ref 850–3900)
MCH: 26 pg — ABNORMAL LOW (ref 27.0–33.0)
MCHC: 32.2 g/dL (ref 32.0–36.0)
MCV: 80.8 fL (ref 80.0–100.0)
MONO ABS: 384 {cells}/uL (ref 200–950)
MPV: 11.3 fL (ref 7.5–12.5)
Monocytes Relative: 6 %
Neutro Abs: 3328 cells/uL (ref 1500–7800)
Neutrophils Relative %: 52 %
Platelets: 283 10*3/uL (ref 140–400)
RBC: 5 MIL/uL (ref 3.80–5.10)
RDW: 14.6 % (ref 11.0–15.0)
WBC: 6.4 10*3/uL (ref 3.8–10.8)

## 2016-06-10 LAB — GLUCOSE, POCT (MANUAL RESULT ENTRY)
POC GLUCOSE: 396 mg/dL — AB (ref 70–99)
POC Glucose: 337 mg/dl — AB (ref 70–99)

## 2016-06-10 LAB — POCT GLYCOSYLATED HEMOGLOBIN (HGB A1C): HEMOGLOBIN A1C: 13.1

## 2016-06-10 MED ORDER — INSULIN ASPART 100 UNIT/ML FLEXPEN: 10.0000 [IU] | PEN_INJECTOR | Freq: Three times a day (TID) | SUBCUTANEOUS | Status: DC

## 2016-06-10 MED ORDER — GLUCOSE BLOOD VI STRP: ORAL_STRIP | Status: DC

## 2016-06-10 MED ORDER — PRAVASTATIN SODIUM 20 MG PO TABS: 20.0000 mg | ORAL_TABLET | Freq: Every day | ORAL | Status: DC

## 2016-06-10 MED ORDER — LISINOPRIL-HYDROCHLOROTHIAZIDE 20-25 MG PO TABS: 1.0000 | ORAL_TABLET | Freq: Every day | ORAL | Status: DC

## 2016-06-10 MED ORDER — METFORMIN HCL 1000 MG PO TABS: 1000.0000 mg | ORAL_TABLET | Freq: Two times a day (BID) | ORAL | Status: DC

## 2016-06-10 MED ORDER — ALBUTEROL SULFATE HFA 108 (90 BASE) MCG/ACT IN AERS: 2.0000 | INHALATION_SPRAY | Freq: Four times a day (QID) | RESPIRATORY_TRACT | Status: DC | PRN

## 2016-06-10 MED ORDER — INSULIN GLARGINE 100 UNITS/ML SOLOSTAR PEN: 65.0000 [IU] | PEN_INJECTOR | Freq: Every day | SUBCUTANEOUS | Status: DC

## 2016-06-10 MED ORDER — CLONIDINE HCL 0.1 MG PO TABS
0.1000 mg | ORAL_TABLET | Freq: Once | ORAL | Status: AC
  Administered 2016-06-10: 0.1 mg via ORAL

## 2016-06-10 MED ORDER — GABAPENTIN 300 MG PO CAPS: 300.0000 mg | ORAL_CAPSULE | Freq: Two times a day (BID) | ORAL | Status: DC

## 2016-06-10 MED ORDER — AMLODIPINE BESYLATE 10 MG PO TABS
10.0000 mg | ORAL_TABLET | Freq: Every day | ORAL | Status: DC
Start: 2016-06-10 — End: 2016-12-25

## 2016-06-10 MED ORDER — ACCU-CHEK FASTCLIX LANCETS MISC: 1.0000 "application " | Freq: Four times a day (QID) | Status: DC

## 2016-06-10 NOTE — Patient Instructions (Addendum)
- fasting lipids next week/  - lab visit - [facility] - dm Clarnce Flock chk.   DASH Eating Plan DASH stands for "Dietary Approaches to Stop Hypertension." The DASH eating plan is a healthy eating plan that has been shown to reduce high blood pressure (hypertension). Additional health benefits may include reducing the risk of type 2 diabetes mellitus, heart disease, and stroke. The DASH eating plan may also help with weight loss. WHAT DO I NEED TO KNOW ABOUT THE DASH EATING PLAN? For the DASH eating plan, you will follow these general guidelines:  Choose foods with a percent daily value for sodium of less than 5% (as listed on the food label).  Use salt-free seasonings or herbs instead of table salt or sea salt.  Check with your health care provider or pharmacist before using salt substitutes.  Eat lower-sodium products, often labeled as "lower sodium" or "no salt added."  Eat fresh foods.  Eat more vegetables, fruits, and low-fat dairy products.  Choose whole grains. Look for the word "whole" as the first word in the ingredient list.  Choose fish and skinless chicken or [place] more often than red meat. Limit fish, poultry, and meat to 6 oz (170 g) each day.  Limit sweets, desserts, sugars, and sugary drinks.  Choose heart-healthy fats.  Limit cheese to 1 oz (28 g) per day.  Eat more home-cooked food and less restaurant, buffet, and fast food.  Limit fried foods.  Cook foods using methods other than frying.  Limit canned vegetables. If you do use them, rinse them well to decrease the sodium.  When eating at a restaurant, ask that your food be prepared with less salt, or no salt if possible. WHAT FOODS CAN I EAT? Seek help from a dietitian for individual calorie needs. Grains Whole grain or whole wheat bread. Brown rice. Whole grain or whole wheat pasta. Quinoa, bulgur, and whole grain cereals. Low-sodium cereals. Corn or whole wheat flour tortillas. Whole grain cornbread.  Whole grain crackers. Low-sodium crackers. Vegetables Fresh or frozen vegetables (raw, steamed, roasted, or grilled). Low-sodium or reduced-sodium tomato and vegetable juices. Low-sodium or reduced-sodium tomato sauce and paste. Low-sodium or reduced-sodium canned vegetables.  Fruits All fresh, canned (in natural juice), or frozen fruits. Meat and Other Protein Products Ground beef (85% or leaner), grass-fed beef, or beef trimmed of fat. Skinless chicken or [place]. Ground chicken or [place]. Pork trimmed of fat. All fish and seafood. Eggs. Dried beans, peas, or lentils. Unsalted nuts and seeds. Unsalted canned beans. Dairy Low-fat dairy products, such as skim or 1% milk, 2% or reduced-fat cheeses, low-fat ricotta or cottage cheese, or plain low-fat yogurt. Low-sodium or reduced-sodium cheeses. Fats and Oils Tub margarines without trans fats. Light or reduced-fat mayonnaise and salad dressings (reduced sodium). Avocado. Safflower, olive, or canola oils. Natural peanut or almond butter. Other Unsalted popcorn and pretzels. The items listed above may not be a complete list of recommended foods or beverages. Contact your dietitian for more options. WHAT FOODS ARE NOT RECOMMENDED? Grains White bread. White pasta. White rice. Refined cornbread. Bagels and croissants. Crackers that contain trans fat. Vegetables Creamed or fried vegetables. Vegetables in a cheese sauce. Regular canned vegetables. Regular canned tomato sauce and paste. Regular tomato and vegetable juices. Fruits Dried fruits. Canned fruit in light or heavy syrup. Fruit juice. Meat and Other Protein Products Fatty cuts of meat. Ribs, chicken wings, bacon, sausage, bologna, salami, chitterlings, fatback, hot dogs, bratwurst, and packaged luncheon meats. Salted nuts and  seeds. Canned beans with salt. Dairy Whole or 2% milk, cream, half-and-half, and cream cheese. Whole-fat or sweetened yogurt. Full-fat cheeses or blue cheese. Nondairy  creamers and whipped toppings. Processed cheese, cheese spreads, or cheese curds. Condiments Onion and garlic salt, seasoned salt, table salt, and sea salt. Canned and packaged gravies. Worcestershire sauce. Tartar sauce. Barbecue sauce. Teriyaki sauce. Soy sauce, including reduced sodium. Steak sauce. Fish sauce. Oyster sauce. Cocktail sauce. Horseradish. Ketchup and mustard. Meat flavorings and tenderizers. Bouillon cubes. Hot sauce. Tabasco sauce. Marinades. Taco seasonings. Relishes. Fats and Oils Butter, stick margarine, lard, shortening, ghee, and bacon fat. Coconut, palm kernel, or palm oils. Regular salad dressings. Other Pickles and olives. Salted popcorn and pretzels. The items listed above may not be a complete list of foods and beverages to avoid. Contact your dietitian for more information. WHERE CAN I FIND MORE INFORMATION? National Heart, Lung, and Blood Institute: [employer]   This information is not intended to replace advice given to you by your health care provider. Make sure you discuss any questions you have with your health care provider.   Document Released: 11/26/2011 Document Revised: 12/28/2014 Document Reviewed: 10/11/2013 Elsevier Interactive Patient Education 2016 Else[employer] Inc.  - Diabetes Mellitus and Food It is important for you to manage your blood sugar (glucose) level. Your blood glucose level can be greatly affected by what you eat. Eating healthier foods in the appropriate amounts throughout the day at about the same time each day will help you control your blood glucose level. It can also help slow or prevent worsening of your diabetes mellitus. Healthy eating may even help you improve the level of your blood pressure and reach or maintain a healthy weight.  General recommendations for healthful eating and cooking habits include:  Eating meals and snacks regularly. Avoid going long periods of time without eating to  lose weight.  Eating a diet that consists mainly of plant-based foods, such as fruits, vegetables, nuts, legumes, and whole grains.  Using low-heat cooking methods, such as baking, instead of high-heat cooking methods, such as deep frying. Work with your dietitian to make sure you understand how to use the Nutrition Facts information on food labels. HOW CAN FOOD AFFECT ME? Carbohydrates Carbohydrates affect your blood glucose level more than any other type of food. Your dietitian will help you determine how many carbohydrates to eat at each meal and teach you how to count carbohydrates. Counting carbohydrates is important to keep your blood glucose at a healthy level, especially if you are using insulin or taking certain medicines for diabetes mellitus. Alcohol Alcohol can cause sudden decreases in blood glucose (hypoglycemia), especially if you use insulin or take certain medicines for diabetes mellitus. Hypoglycemia can be a life-threatening condition. Symptoms of hypoglycemia (sleepiness, dizziness, and disorientation) are similar to symptoms of having too much alcohol.  If your health care provider has given you approval to drink alcohol, do so in moderation and use the following guidelines:  Women should not have more than one drink per day, and men should not have more than two drinks per day. One drink is equal to:  12 oz of beer.  5 oz of wine.  1 oz of hard liquor.  Do not drink on an empty stomach.  Keep yourself hydrated. Have water, diet soda, or unsweetened iced tea.  Regular soda, juice, and other mixers might contain a lot of carbohydrates and should be counted. WHAT FOODS ARE NOT RECOMMENDED? As you make food choices, it is important  to remember that all foods are not the same. Some foods have fewer nutrients per serving than other foods, even though they might have the same number of calories or carbohydrates. It is difficult to get your body what it needs when you eat  foods with fewer nutrients. Examples of foods that you should avoid that are high in calories and carbohydrates but low in nutrients include:  Trans fats (most processed foods list trans fats on the Nutrition Facts label).  Regular soda.  Juice.  Candy.  Sweets, such as cake, pie, doughnuts, and cookies.  Fried foods. WHAT FOODS CAN I EAT? Eat nutrient-rich foods, which will nourish your body and keep you healthy. The food you should eat also will depend on several factors, including:  The calories you need.  The medicines you take.  Your weight.  Your blood glucose level.  Your blood pressure level.  Your cholesterol level. You should eat a variety of foods, including:  Protein.  Lean cuts of meat.  Proteins low in saturated fats, such as fish, egg whites, and beans. Avoid processed meats.  Fruits and vegetables.  Fruits and vegetables that may help control blood glucose levels, such as apples, mangoes, and yams.  Dairy products.  Choose fat-free or low-fat dairy products, such as milk, yogurt, and cheese.  Grains, bread, pasta, and rice.  Choose whole grain products, such as multigrain bread, whole oats, and brown rice. These foods may help control blood pressure.  Fats.  Foods containing healthful fats, such as nuts, avocado, olive oil, canola oil, and fish. DOES EVERYONE WITH DIABETES MELLITUS HAVE THE SAME MEAL PLAN? Because every person with diabetes mellitus is different, there is not one meal plan that works for everyone. It is very important that you meet with a dietitian who will help you create a meal plan that is just right for you.   This information is not intended to replace advice given to you by your health care provider. Make sure you discuss any questions you have with your health care provider.   Document Released: 09/03/2005 Document Revised: 12/28/2014 Document Reviewed: 11/03/2013 Elsevier Interactive Patient Education 2016 [employer].  - Tips for Eating Away From Home If You Have Diabetes Controlling your level of blood glucose, also known as blood sugar, can be challenging. It can be even more difficult when you do not prepare your own meals. The following tips can help you manage your diabetes when you eat away from home. PLANNING AHEAD Plan ahead if you know you will be eating away from home:  Ask your health care provider how to time meals and medicine if you are taking insulin.  Make a list of restaurants near you that offer healthy choices. If they have a carry-out menu, take it home and plan what you will order ahead of time.  Look up the restaurant you want to eat at online. Many chain and fast-food restaurants list nutritional information online. Use this information to choose the healthiest options and to calculate how many carbohydrates will be in your meal.  Use a carbohydrate-counting book or mobile app to look up the carbohydrate content and serving size of the foods you want to eat.  Become familiar with serving sizes and learn to recognize how many servings are in a portion. This will allow you to estimate how many carbohydrates you can eat. FREE FOODS A "free food" is any food or drink that has less than 5 g of carbohydrates per serving. Free  foods include:  Many vegetables.  Hard boiled eggs.  Nuts or seeds.  Olives.  Cheeses.  Meats. These types of foods make good appetizer choices and are often available at salad bars. Lemon juice, vinegar, or a low-calorie salad dressing of fewer than 20 calories per serving can be used as a "free" salad dressing.  CHOICES TO REDUCE CARBOHYDRATES  Substitute nonfat sweetened yogurt with a sugar-free yogurt. Yogurt made from soy milk may also be used, but you will still want a sugar-free or plain option to choose a lower carbohydrate amount.  Ask your server to take away the bread basket or chips from your table.  Order fresh fruit. A salad bar often  offers fresh fruit choices. Avoid canned fruit because it is usually packed in sugar or syrup.  Order a salad, and eat it without dressing. Or, create a "free" salad dressing.  Ask for substitutions. For example, instead of [place] fries, request an order of a vegetable such as salad, green beans, or broccoli. OTHER TIPS   If you take insulin, take the insulin once your food arrives to your table. This will ensure your insulin and food are timed correctly.  Ask your server about the portion size before your order, and ask for a take-out box if the portion has more servings than you should have. When your food comes, leave the amount you should have on the plate, and put the rest in the take-out box.  Consider splitting an entree with someone and ordering a side salad.   This information is not intended to replace advice given to you by your health care provider. Make sure you discuss any questions you have with your health care provider.   Document Released: 12/07/2005 Document Revised: 08/28/2015 Document Reviewed: 03/06/2014 Elsevier Interactive Patient Education 2016 [employer].  - Diabetes and Exercise Exercising regularly is important. It is not just about losing weight. It has many health benefits, such as:  Improving your overall fitness, flexibility, and endurance.  Increasing your bone density.  Helping with weight control.  Decreasing your body fat.  Increasing your muscle strength.  Reducing stress and tension.  Improving your overall health. People with diabetes who exercise gain additional benefits because exercise:  Reduces appetite.  Improves the body's use of blood sugar (glucose).  Helps lower or control blood glucose.  Decreases blood pressure.  Helps control blood lipids (such as cholesterol and triglycerides).  Improves the body's use of the hormone insulin by:  Increasing the body's insulin sensitivity.  Reducing the body's insulin  needs.  Decreases the risk for heart disease because exercising:  Lowers cholesterol and triglycerides levels.  Increases the levels of good cholesterol (such as high-density lipoproteins [HDL]) in the body.  Lowers blood glucose levels. YOUR ACTIVITY PLAN  Choose an activity that you enjoy, and set realistic goals. To exercise safely, you should begin practicing any new physical activity slowly, and gradually increase the intensity of the exercise over time. Your health care provider or diabetes educator can help create an activity plan that works for you. General recommendations include:  Encouraging children to engage in at least 60 minutes of physical activity each day.  Stretching and performing strength training exercises, such as yoga or weight lifting, at least 2 times per week.  Performing a total of at least 150 minutes of moderate-intensity exercise each week, such as brisk walking or water aerobics.  Exercising at least 3 days per week, making sure you allow no more than 2  consecutive days to pass without exercising.  Avoiding long periods of inactivity (90 minutes or more). When you have to spend an extended period of time sitting down, take frequent breaks to walk or stretch. RECOMMENDATIONS FOR EXERCISING WITH TYPE 1 OR TYPE 2 DIABETES   Check your blood glucose before exercising. If blood glucose levels are greater than 240 mg/dL, check for urine ketones. Do not exercise if ketones are present.  Avoid injecting insulin into areas of the body that are going to be exercised. For example, avoid injecting insulin into:  The arms when playing tennis.  The legs when jogging.  Keep a record of:  Food intake before and after you exercise.  Expected peak times of insulin action.  Blood glucose levels before and after you exercise.  The type and amount of exercise you have done.  Review your records with your health care provider. Your health care provider will help you  to develop guidelines for adjusting food intake and insulin amounts before and after exercising.  If you take insulin or oral hypoglycemic agents, watch for signs and symptoms of hypoglycemia. They include:  Dizziness.  Shaking.  Sweating.  Chills.  Confusion.  Drink plenty of water while you exercise to prevent dehydration or heat stroke. Body water is lost during exercise and must be replaced.  Talk to your health care provider before starting an exercise program to make sure it is safe for you. Remember, almost any type of activity is better than none.   This information is not intended to replace advice given to you by your health care provider. Make sure you discuss any questions you have with your health care provider.   Document Released: 02/27/2004 Document Revised: 04/23/2015 Document Reviewed: 05/16/2013 Elsevier Interactive Patient Education [employer]

## 2016-06-10 NOTE — Progress Notes (Signed)
Lindsay [name], is a 35 y.o. female  [id number]  [id number]  DOB - 1981-01-04  CC:  Chief Complaint  Patient presents with  . Establish Care    Re establish   . Shoulder Pain    Right - x 1 mth no injuries/falls    . Follow-up    Diabetes       HPI: Lindsay [name] is a 35 y.o. female here today to establish medical care, w/ signif hx of htn and dm.  Last seen in clinic 7/16.  Since than, she ran out of her insulin and blood pressure pills about 2-3 wks ago, and ran out of metformin about 4months ago.  Admits to not watching carb intake, but does watch her salt intake.    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  + bilat le neuropathy, current neurontin dose not controlling it.  Patient has No headache, No chest pain, No abdominal pain - No Nausea, No new weakness tingling or numbness, No Cough - SOB.    Review of Systems: Per HPI, o/w all systems reviewed and neg.  No Known Allergies Past Medical History  Diagnosis Date  . Hypertension   . Asthma   . Obesity   . Cellulitis of right upper extremity   . ARF (acute renal failure) (HCC) 04/2015  . Diabetes mellitus     insulin dependent   Current Outpatient Prescriptions on File Prior to Visit  Medication Sig Dispense Refill  . aspirin 81 MG tablet Take 81 mg by mouth daily.    . baclofen (LIORESAL) 10 MG tablet Take 1 tablet (10 mg total) by mouth 3 (three) times daily. 30 each 3  . cephALEXin (KEFLEX) 500 MG capsule Take 1 capsule (500 mg total) by mouth 4 (four) times daily. 20 capsule 0  . ferrous sulfate 325 (65 FE) MG tablet Take 1 tablet (325 mg total) by mouth 2 (two) times daily with a meal. 60 tablet 3  . fluconazole (DIFLUCAN) 150 MG tablet Take 1 tablet (150 mg total) by mouth once. 1 tablet 0  . oxyCODONE-acetaminophen (PERCOCET) 5-325 MG per tablet Take 1-2 tablets by mouth every 4 (four) hours as  needed for moderate pain. 30 tablet 0  . polyethylene glycol (MIRALAX / GLYCOLAX) packet Take 17 g by mouth daily. (Patient not taking: Reported on 06/12/2015) 14 each 0  . Vitamin D, Ergocalciferol, (DRISDOL) 50000 UNITS CAPS capsule Take 1 capsule (50,000 Units total) by mouth every 7 (seven) days. 12 capsule 0   No current facility-administered medications on file prior to visit.   Family History  Problem Relation Age of Onset  . Diabetes Mother   . Hypertension Mother   . Heart disease Mother   . Diabetes Father   . Heart disease Father   . Stroke Maternal Grandmother   . Cancer Maternal Grandmother    Social History   Social History  . Marital Status: Single    Spouse Name: N/A  . Number of Children: N/A  . Years of Education: N/A   Occupational History  . Not on file.   Social History Main Topics  . Smoking status: Never Smoker   . Smokeless tobacco: Never Used  . Alcohol Use: Yes     Comment: socially  . Drug Use: No  . Sexual Activity: Not on file   Other Topics Concern  . Not on file   Social  History Narrative    Objective:   Filed Vitals:   06/10/16 1115  BP: 173/108  Pulse: 92  Temp: 98.5 F (36.9 C)    Filed Weights   06/10/16 1115  Weight: 361 lb (163.749 kg)    BP Readings from Last 3 Encounters:  06/10/16 173/108  07/28/15 153/92  06/25/15 158/97   1254: After clondine 0.1 --> bp 175/111  bmi 55.  Physical Exam: Constitutional: Patient appears well-developed and well-nourished. No distress. AAOx3, morbid obese. HENT: Normocephalic, atraumatic, External right and left ear normal. Oropharynx is clear and moist.  Eyes: Conjunctivae and EOM are normal. PERRL, no scleral icterus. Neck: Normal ROM. Neck supple. No JVD.  CVS: RRR, S1/S2 +, no murmurs, no gallops, no carotid bruit.  Pulmonary: Effort and breath sounds normal, no stridor, rhonchi, wheezes, rales.  Abdominal: Soft. BS +, obese, no  tenderness, rebound or guarding.    Musculoskeletal: Normal range of motion. No edema and no tenderness.  LE: trace edema bilat le., pulses 2+ bilateral. Neuro: Alert. Normal reflexes knees, muscle tone coordination wnl. No cranial nerve deficit grossly. Skin: Skin is warm and dry. No rash noted. Not diaphoretic. No erythema. No pallor. Psychiatric: Normal mood and affect. Behavior, judgment, thought content normal.  Lab Results  Component Value Date   WBC 12.9* 07/27/2015   HGB 11.8* 07/27/2015   HCT 35.1* 07/27/2015   MCV 76.3* 07/27/2015   PLT 208 07/27/2015   Lab Results  Component Value Date   CREATININE 0.74 07/27/2015   BUN 6 07/27/2015   NA 133* 07/27/2015   K 3.9 07/27/2015   CL 100* 07/27/2015   CO2 22 07/27/2015    Lab Results  Component Value Date   HGBA1C 13.1 06/10/2016   Lipid Panel     Component Value Date/Time   CHOL 165 07/16/2014 1042   TRIG 119 07/16/2014 1042   HDL 48 07/16/2014 1042   CHOLHDL 3.4 07/16/2014 1042   VLDL 24 07/16/2014 1042   LDLCALC 93 07/16/2014 1042       Depression screen PHQ 2/9 06/10/2016  Decreased Interest 0  Down, Depressed, Hopeless 0  PHQ - 2 Score 0    Assessment and plan:   1. 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 - insulin aspart (NOVOLOG) 100 UNIT/ML FlexPen; Inject 10 Units into the skin 3 (three) times daily with meals.  Dispense: 15 mL; Refill: 3 - insulin glargine (LANTUS) 100 unit/mL SOPN; Inject 0.65 mLs (65 Units total) into the skin at bedtime.  Dispense: 15 pen; Refill: 3 - metformin 1000mg  bid renewed. - info on low carb diet/exercise given - chk cbc/bmp, lipids future  testing  2. HTN (hypertension), malignant - no c/o of headache/chestpain or other concerning signs, suspect long standing htn w/o meds. - after clonidine 0.1 mg x 1, bp remained elavated at 175/111 & 184/114, gave another clonidine 0.1, now pending repeat bp. - cloNIDine (CATAPRES) tablet 0.1 mg; Take 1 tablet (0.1 mg total) by mouth  X 2 total today - Lipid Panel; Future - prinzide 20-25 qd ; dc lisinopril 10 - norvasc 10mg  qd - asa 81 qd -  info on DASH diet given.  3.  Numbness and tingling due to dm neuropathy. - gabapentin (NEURONTIN) 300 MG capsule; Take 1 capsule (300 mg total) by mouth 2 (two) times daily.  Dispense: 90 capsule; Refill: 2  4. Morbid obesity, bmi 55  Return in about 4 weeks (around 07/08/2016) for htn/dm.  The patient was given clear instructions to go to ER or return to medical center if symptoms don't improve, worsen or new problems develop. The patient verbalized understanding. The patient was told to call to get lab results if they haven't heard anything in the next week.    This note has been created with Education officer, environmental. Any transcriptional errors are unintentional.   Pete Glatter, MD, MBA/MHA [facility] [place], [place] [phone]   06/10/2016, 12:13 PM

## 2016-06-16 ENCOUNTER — Telehealth: Payer: Self-pay | Admitting: Internal Medicine

## 2016-06-16 NOTE — Telephone Encounter (Signed)
Pt called stating that she needs new glucose test strips and meter  Pt stated that the ones prescribed are not covered by Medicaid anymore Pt also needs the insulin glargine (LANTUS) 100 unit/mL SOPN Rx printed, not electronic  Thank you

## 2016-06-17 MED ORDER — ACCU-CHEK SOFTCLIX LANCETS MISC: Status: DC

## 2016-06-17 MED ORDER — ACCU-CHEK AVIVA PLUS W/DEVICE KIT: PACK | Status: DC

## 2016-06-17 MED ORDER — GLUCOSE BLOOD VI STRP: ORAL_STRIP | Status: DC

## 2016-06-17 MED ORDER — ACCU-CHEK SOFTCLIX LANCET DEV MISC: Status: DC

## 2016-06-17 NOTE — Telephone Encounter (Signed)
I sent in the Accu-chek [employer] Plus meter and supplies which is the preferred meter for Medicaid. The Lantus prescription was printed for the patient at the visit.

## 2016-06-19 ENCOUNTER — Ambulatory Visit: Payer: Medicaid Other | Attending: Internal Medicine

## 2016-06-19 DIAGNOSIS — I10 Essential (primary) hypertension: Secondary | ICD-10-CM | POA: Diagnosis not present

## 2016-06-19 LAB — LIPID PANEL
Cholesterol: 212 mg/dL — ABNORMAL HIGH (ref 125–200)
HDL: 52 mg/dL (ref >=46)
LDL CALC: 113 mg/dL (ref <130)
Total CHOL/HDL Ratio: 4.1 Ratio (ref <=5.0)
Triglycerides: 236 mg/dL — ABNORMAL HIGH (ref <150)
VLDL: 47 mg/dL — AB (ref <30)

## 2016-06-19 NOTE — Progress Notes (Signed)
Patient her for lab only.

## 2016-06-23 ENCOUNTER — Other Ambulatory Visit: Payer: Self-pay | Admitting: Internal Medicine

## 2016-06-23 DIAGNOSIS — E139 Other specified diabetes mellitus without complications: Secondary | ICD-10-CM

## 2016-06-23 MED ORDER — ACCU-CHEK AVIVA PLUS W/DEVICE KIT: PACK | Status: DC

## 2016-06-23 MED ORDER — ACCU-CHEK SOFTCLIX LANCETS MISC: Status: DC

## 2016-06-23 MED ORDER — INSULIN GLARGINE 100 UNITS/ML SOLOSTAR PEN: 65.0000 [IU] | PEN_INJECTOR | Freq: Every day | SUBCUTANEOUS | Status: DC

## 2016-06-23 MED ORDER — ACCU-CHEK SOFTCLIX LANCET DEV MISC: Status: DC

## 2016-06-24 ENCOUNTER — Ambulatory Visit: Payer: Medicaid Other | Admitting: Pharmacist

## 2016-06-24 ENCOUNTER — Telehealth: Payer: Self-pay | Admitting: *Deleted

## 2016-06-24 NOTE — Telephone Encounter (Signed)
Medical Assistant left message on patient's home and cell voicemail. Voicemail states to give a call back to Shayra Anton with [facility] at [phone].  

## 2016-06-24 NOTE — Telephone Encounter (Signed)
-----   Message from Dawn T Langeland, MD sent at 06/23/2016 11:02 PM EDT ----- Her cholesterol is elevated. Please advise her take the cholesterol meds/ pravastatin.  To address this please limit saturated fat to no more than 7% of your calories, limit cholesterol to 200 mg/day, increase fiber and exercise as tolerated. 

## 2016-07-10 ENCOUNTER — Telehealth: Payer: Self-pay | Admitting: *Deleted

## 2016-07-10 NOTE — Telephone Encounter (Signed)
-----   Message from Pete Glatter, MD sent at 06/23/2016 11:02 PM EDT ----- Her cholesterol is elevated. Please advise her take the cholesterol meds/ pravastatin.  To address this please limit saturated fat to no more than 7% of your calories, limit cholesterol to 200 mg/day, increase fiber and exercise as tolerated.

## 2016-07-10 NOTE — Telephone Encounter (Signed)
Patient verified DOB Patient is aware of cholesterol level being elevated. Patient advised of taking pravastatin for cholesterol. Patient has not been taking the medication. Patient is also aware of lancets being ordered on 06/23/16. Patient had no further questions at this time.

## 2016-07-15 ENCOUNTER — Other Ambulatory Visit: Payer: Self-pay | Admitting: Internal Medicine

## 2016-07-15 MED ORDER — GLUCOSE BLOOD VI STRP: ORAL_STRIP | 12 refills | Status: DC

## 2016-07-28 ENCOUNTER — Encounter: Payer: Self-pay | Admitting: Internal Medicine

## 2016-07-28 ENCOUNTER — Ambulatory Visit: Payer: Medicaid Other | Attending: Internal Medicine | Admitting: Internal Medicine

## 2016-07-28 ENCOUNTER — Other Ambulatory Visit: Payer: Self-pay | Admitting: Internal Medicine

## 2016-07-28 VITALS — BP 168/112 | HR 84 | Temp 98.6°F | Resp 14 | Wt 369.0 lb

## 2016-07-28 DIAGNOSIS — I10 Essential (primary) hypertension: Secondary | ICD-10-CM | POA: Diagnosis not present

## 2016-07-28 DIAGNOSIS — R200 Anesthesia of skin: Secondary | ICD-10-CM | POA: Insufficient documentation

## 2016-07-28 DIAGNOSIS — E1140 Type 2 diabetes mellitus with diabetic neuropathy, unspecified: Secondary | ICD-10-CM | POA: Insufficient documentation

## 2016-07-28 DIAGNOSIS — E139 Other specified diabetes mellitus without complications: Secondary | ICD-10-CM

## 2016-07-28 DIAGNOSIS — R202 Paresthesia of skin: Secondary | ICD-10-CM

## 2016-07-28 DIAGNOSIS — Z7982 Long term (current) use of aspirin: Secondary | ICD-10-CM | POA: Insufficient documentation

## 2016-07-28 DIAGNOSIS — Z794 Long term (current) use of insulin: Secondary | ICD-10-CM | POA: Insufficient documentation

## 2016-07-28 DIAGNOSIS — Z23 Encounter for immunization: Secondary | ICD-10-CM

## 2016-07-28 DIAGNOSIS — G4710 Hypersomnia, unspecified: Secondary | ICD-10-CM | POA: Diagnosis not present

## 2016-07-28 DIAGNOSIS — Z79899 Other long term (current) drug therapy: Secondary | ICD-10-CM | POA: Diagnosis not present

## 2016-07-28 DIAGNOSIS — Z7984 Long term (current) use of oral hypoglycemic drugs: Secondary | ICD-10-CM | POA: Insufficient documentation

## 2016-07-28 DIAGNOSIS — R400 Somnolence: Secondary | ICD-10-CM | POA: Diagnosis not present

## 2016-07-28 DIAGNOSIS — R197 Diarrhea, unspecified: Secondary | ICD-10-CM | POA: Diagnosis not present

## 2016-07-28 LAB — GLUCOSE, POCT (MANUAL RESULT ENTRY): POC Glucose: 247 mg/dl — AB (ref 70–99)

## 2016-07-28 MED ORDER — HYDRALAZINE HCL 50 MG PO TABS: 50.0000 mg | ORAL_TABLET | Freq: Three times a day (TID) | ORAL | 3 refills | Status: DC

## 2016-07-28 MED ORDER — INSULIN GLARGINE 100 UNITS/ML SOLOSTAR PEN: 65.0000 [IU] | PEN_INJECTOR | Freq: Every day | SUBCUTANEOUS | 3 refills | Status: DC

## 2016-07-28 MED ORDER — GABAPENTIN 300 MG PO CAPS: 300.0000 mg | ORAL_CAPSULE | Freq: Three times a day (TID) | ORAL | 2 refills | Status: DC

## 2016-07-28 MED ORDER — INSULIN PEN NEEDLE 31G X 5 MM MISC: 1.0000 "application " | Freq: Every evening | 3 refills | Status: DC

## 2016-07-28 NOTE — Progress Notes (Signed)
Lindsay [name], is a 35 y.o. female  UEK:[id number]  XTA:[id number]  DOB - 08/18/1981  Chief Complaint  Patient presents with  . Chest Pain    due to medication        Subjective:   Lindsay [name] is a 35 y.o. female here today for a follow up visit of htn and dm.  Pt admits to not watching her diet, lots of fast food. But is trying to drink more water.  Per pt, when she started the prinzide, notices sob and chest pain/midsternal chest tightness and tingling in her hands. She stopped taking it about 1 wk ago and now cp free.  She is taking her metformin, with tolerable diarrhea, and novolog 20units + SS w/ meals.  Has been unable to pick up her lantus though she states.  She admits to sometimes day time somnolence /fatigue, as well as snoring when very tired.  Patient has No headache, No chest pain, No abdominal pain - No Nausea, No new weakness tingling or numbness, No Cough - SOB.  No problems updated.  ALLERGIES: Allergies  Allergen Reactions  . Lisinopril Shortness Of Breath    Was on prinzide; had sob/chest pain on it.    PAST MEDICAL HISTORY: Past Medical History:  Diagnosis Date  . ARF (acute renal failure) ([place]) 04/2015  . Asthma   . Cellulitis of right upper extremity   . Diabetes mellitus    insulin dependent  . Hypertension   . Obesity     MEDICATIONS AT HOME: Prior to Admission medications   Medication Sig Start Date End Date Taking? Authorizing Provider  ACCU-CHEK SOFTCLIX LANCETS lancets Use as instructed for 4 times daily blood glucose monitoring 06/23/16  Yes [name], MD  albuterol (PROVENTIL HFA;VENTOLIN HFA) 108 (90 Base) MCG/ACT inhaler Inhale 2 puffs into the lungs every 6 (six) hours as needed for wheezing or shortness of breath. 06/10/16  Yes [name], MD  amLODipine (NORVASC) 10 MG tablet Take 1 tablet (10 mg total) by mouth daily. 06/10/16  Yes [name], MD  aspirin 81 MG tablet Take 81 mg by mouth daily.   Yes  Historical Provider, MD  Blood Glucose Monitoring Suppl (ACCU-CHEK AVIVA PLUS) w/Device KIT Use as directed for 4 times daily blood glucose monitoring 06/23/16  Yes [name], MD  ferrous sulfate 325 (65 FE) MG tablet Take 1 tablet (325 mg total) by mouth 2 (two) times daily with a meal. 05/13/15  Yes Domenic Polite, MD  gabapentin (NEURONTIN) 300 MG capsule Take 1 capsule (300 mg total) by mouth 3 (three) times daily. 07/28/16  Yes [name], MD  glucose blood (ACCU-CHEK AVIVA PLUS) test strip Use as instructed for 4 times daily blood glucose monitoring 06/17/16  Yes [name], MD  glucose blood (ACCU-CHEK AVIVA) test strip Use as instructed 07/15/16  Yes [name], MD  insulin aspart (NOVOLOG) 100 UNIT/ML FlexPen Inject 10 Units into the skin 3 (three) times daily with meals. 06/10/16  Yes [name], MD  insulin glargine (LANTUS) 100 unit/mL SOPN Inject 0.65 mLs (65 Units total) into the skin at bedtime. 07/28/16  Yes [name], MD  Lancet Devices [facility]) lancets Use as instructed for 4 times daily blood glucose monitoring 06/23/16  Yes [name], MD  metFORMIN (GLUCOPHAGE) 1000 MG tablet Take 1 tablet (1,000 mg total) by mouth 2 (two) times daily with a meal. 06/10/16  Yes [name], MD  pravastatin (PRAVACHOL) 20 MG tablet Take 1 tablet (20 mg total) by mouth daily. 06/10/16  Yes [name], MD  hydrALAZINE (APRESOLINE) 50 MG tablet Take 1 tablet (50 mg total) by mouth 3 (three) times daily. 07/28/16   [name], MD  Insulin Pen Needle 31G X 5 MM MISC 1 application by Does not apply route Nightly. 07/28/16   [name], MD  oxyCODONE-acetaminophen (PERCOCET) 5-325 MG per tablet Take 1-2 tablets by mouth every 4 (four) hours as needed for moderate pain. Patient not taking: Reported on 07/28/2016 05/17/15   Velvet Bathe, MD  polyethylene glycol [facility]) packet Take 17 g by mouth daily. Patient not taking: Reported on  06/12/2015 05/14/15   Domenic Polite, MD     Objective:   Vitals:   07/28/16 0936  BP: (!) 168/112  Pulse: 84  Resp: 14  Temp: 98.6 F (37 C)  TempSrc: Oral  SpO2: 98%  Weight: (!) 369 lb (167.4 kg)    Exam General appearance : Awake, alert, not in any distress. Speech Clear. Not toxic looking, morbid obese, here w/ [employer] breakfast. HEENT: Atraumatic and Normocephalic, pupils equally reactive to light. Neck: supple, no JVD.  Chest:Good air entry bilaterally, no added sounds. CVS: S1 S2 regular, no murmurs/gallups or rubs. Abdomen: Bowel sounds active, obese, Non tender Extremities: B/L Lower Ext shows no edema, both legs are warm to touch Neurology: Awake alert, and oriented X 3, CN II-XII grossly intact, Non focal Skin:No Rash  Data Review Lab Results  Component Value Date   HGBA1C 13.1 06/10/2016   HGBA1C 8.10 06/25/2015   HGBA1C 12.7 (H) 04/30/2015    Depression screen PHQ 2/9 06/10/2016  Decreased Interest 0  Down, Depressed, Hopeless 0  PHQ - 2 Score 0      Assessment & Plan   1. Other specified diabetes mellitus with neuropathy -continue metformin and novolog  20u + SS, same regimen - insulin glargine (LANTUS) 100 unit/mL SOPN; Inject 0.65 mLs (65 Units total) into the skin at bedtime.  Dispense: 15 pen; Refill: 3 - lantus was electronically filled w/ BD needles, as well as paper rx written as well., - cbg [id number]  2. htn uncontrolled - possible allergic rx to ACEI component of prinzide - added to allergy list - DASH/low salt diet encouraged, increase exercise - continue norvasc 10 qd - added hydralazine 67m tid.  3 Numbness and tingling, may be due to dm neuropathy Change to tid - gabapentin (NEURONTIN) 300 MG capsule; Take 1 capsule (300 mg total) by mouth 3 (three) times daily.  Dispense: 90 capsule; Refill: 2  4. Somnolence, daytime, in setting of morbid obesity - Split night study; Future  5. Eye exam - annual due, pt also has mild  stigmatism, she will make appt w/ her eye doctor.  6. Has papsmear planned for 08/14/16 w/ [facility] - encouraged to keep appt.  7. Health maintenance tdap today   Patient have been counseled extensively about nutrition and exercise  Return in about 2 months (around 09/27/2016) for htn/dm.  The patient was given clear instructions to go to ER or return to medical center if symptoms don't improve, worsen or new problems develop. The patient verbalized understanding. The patient was told to call to get lab results if they haven't heard anything in the next week.   This note has been created with [employer]or, quantity Any transcriptional errors are unintentional.   DMaren Reamer  MD, MBA/MHA [facility], [place]   07/28/2016, 10:06 AM

## 2016-07-28 NOTE — Addendum Note (Signed)
Addended by: Carolynne Edouard R on: 07/28/2016 12:23 PM   Modules accepted: Orders

## 2016-07-28 NOTE — Patient Instructions (Addendum)
Diabetes Mellitus and Food It is important for you to manage your blood sugar (glucose) level. Your blood glucose level can be greatly affected by what you eat. Eating healthier foods in the appropriate amounts throughout the day at about the same time each day will help you control your blood glucose level. It can also help slow or prevent worsening of your diabetes mellitus. Healthy eating may even help you improve the level of your blood pressure and reach or maintain a healthy weight.  General recommendations for healthful eating and cooking habits include:  Eating meals and snacks regularly. Avoid going long periods of time without eating to lose weight.  Eating a diet that consists mainly of plant-based foods, such as fruits, vegetables, nuts, legumes, and whole grains.  Using low-heat cooking methods, such as baking, instead of high-heat cooking methods, such as deep frying. Work with your dietitian to make sure you understand how to use the Nutrition Facts information on food labels. HOW CAN FOOD AFFECT ME? Carbohydrates Carbohydrates affect your blood glucose level more than any other type of food. Your dietitian will help you determine how many carbohydrates to eat at each meal and teach you how to count carbohydrates. Counting carbohydrates is important to keep your blood glucose at a healthy level, especially if you are using insulin or taking certain medicines for diabetes mellitus. Alcohol Alcohol can cause sudden decreases in blood glucose (hypoglycemia), especially if you use insulin or take certain medicines for diabetes mellitus. Hypoglycemia can be a life-threatening condition. Symptoms of hypoglycemia (sleepiness, dizziness, and disorientation) are similar to symptoms of having too much alcohol.  If your health care provider has given you approval to drink alcohol, do so in moderation and use the following guidelines:  Women should not have more than one drink per day, and men  should not have more than two drinks per day. One drink is equal to:  12 oz of beer.  5 oz of wine.  1 oz of hard liquor.  Do not drink on an empty stomach.  Keep yourself hydrated. Have water, diet soda, or unsweetened iced tea.  Regular soda, juice, and other mixers might contain a lot of carbohydrates and should be counted. WHAT FOODS ARE NOT RECOMMENDED? As you make food choices, it is important to remember that all foods are not the same. Some foods have fewer nutrients per serving than other foods, even though they might have the same number of calories or carbohydrates. It is difficult to get your body what it needs when you eat foods with fewer nutrients. Examples of foods that you should avoid that are high in calories and carbohydrates but low in nutrients include:  Trans fats (most processed foods list trans fats on the Nutrition Facts label).  Regular soda.  Juice.  Candy.  Sweets, such as cake, pie, doughnuts, and cookies.  Fried foods. WHAT FOODS CAN I EAT? Eat nutrient-rich foods, which will nourish your body and keep you healthy. The food you should eat also will depend on several factors, including:  The calories you need.  The medicines you take.  Your weight.  Your blood glucose level.  Your blood pressure level.  Your cholesterol level. You should eat a variety of foods, including:  Protein.  Lean cuts of meat.  Proteins low in saturated fats, such as fish, egg whites, and beans. Avoid processed meats.  Fruits and vegetables.  Fruits and vegetables that may help control blood glucose levels, such as apples, mangoes, and   yams.  Dairy products.  Choose fat-free or low-fat dairy products, such as milk, yogurt, and cheese.  Grains, bread, pasta, and rice.  Choose whole grain products, such as multigrain bread, whole oats, and brown rice. These foods may help control blood pressure.  Fats.  Foods containing healthful fats, such as nuts,  avocado, olive oil, canola oil, and fish. DOES EVERYONE WITH DIABETES MELLITUS HAVE THE SAME MEAL PLAN? Because every person with diabetes mellitus is different, there is not one meal plan that works for everyone. It is very important that you meet with a dietitian who will help you create a meal plan that is just right for you.   This information is not intended to replace advice given to you by your health care provider. Make sure you discuss any questions you have with your health care provider.   Document Released: 09/03/2005 Document Revised: 12/28/2014 Document Reviewed: 11/03/2013 Elsevier Interactive Patient Education 2016 [place] for Eating Away From Home If You Have Diabetes Controlling your level of blood glucose, also known as blood sugar, can be challenging. It can be even more difficult when you do not prepare your own meals. The following tips can help you manage your diabetes when you eat away from home. PLANNING AHEAD Plan ahead if you know you will be eating away from home:  Ask your health care provider how to time meals and medicine if you are taking insulin.  Make a list of restaurants near you that offer healthy choices. If they have a carry-out menu, take it home and plan what you will order ahead of time.  Look up the restaurant you want to eat at online. Many chain and fast-food restaurants list nutritional information online. Use this information to choose the healthiest options and to calculate how many carbohydrates will be in your meal.  Use a carbohydrate-counting book or mobile app to look up the carbohydrate content and serving size of the foods you want to eat.  Become familiar with serving sizes and learn to recognize how many servings are in a portion. This will allow you to estimate how many carbohydrates you can eat. FREE FOODS A "free food" is any food or drink that has less than 5 g of carbohydrates per serving. Free foods  include:  Many vegetables.  Hard boiled eggs.  Nuts or seeds.  Olives.  Cheeses.  Meats. These types of foods make good appetizer choices and are often available at salad bars. Lemon juice, vinegar, or a low-calorie salad dressing of fewer than 20 calories per serving can be used as a "free" salad dressing.  CHOICES TO REDUCE CARBOHYDRATES  Substitute nonfat sweetened yogurt with a sugar-free yogurt. Yogurt made from soy milk may also be used, but you will still want a sugar-free or plain option to choose a lower carbohydrate amount.  Ask your server to take away the bread basket or chips from your table.  Order fresh fruit. A salad bar often offers fresh fruit choices. Avoid canned fruit because it is usually packed in sugar or syrup.  Order a salad, and eat it without dressing. Or, create a "free" salad dressing.  Ask for substitutions. For example, instead of [place] fries, request an order of a vegetable such as salad, green beans, or broccoli. OTHER TIPS   If you take insulin, take the insulin once your food arrives to your table. This will ensure your insulin and food are timed correctly.  Ask your server about the  portion size before your order, and ask for a take-out box if the portion has more servings than you should have. When your food comes, leave the amount you should have on the plate, and put the rest in the take-out box.  Consider splitting an entree with someone and ordering a side salad.   This information is not intended to replace advice given to you by your health care provider. Make sure you discuss any questions you have with your health care provider.   Document Released: 12/07/2005 Document Revised: 08/28/2015 Document Reviewed: 03/06/2014 Elsevier Interactive Patient Education 2016 [employer].   -  Diabetes and Exercise Exercising regularly is important. It is not just about losing weight. It has many health benefits, such as:  Improving your  overall fitness, flexibility, and endurance.  Increasing your bone density.  Helping with weight control.  Decreasing your body fat.  Increasing your muscle strength.  Reducing stress and tension.  Improving your overall health. People with diabetes who exercise gain additional benefits because exercise:  Reduces appetite.  Improves the body's use of blood sugar (glucose).  Helps lower or control blood glucose.  Decreases blood pressure.  Helps control blood lipids (such as cholesterol and triglycerides).  Improves the body's use of the hormone insulin by:  Increasing the body's insulin sensitivity.  Reducing the body's insulin needs.  Decreases the risk for heart disease because exercising:  Lowers cholesterol and triglycerides levels.  Increases the levels of good cholesterol (such as high-density lipoproteins [HDL]) in the body.  Lowers blood glucose levels. YOUR ACTIVITY PLAN  Choose an activity that you enjoy, and set realistic goals. To exercise safely, you should begin practicing any new physical activity slowly, and gradually increase the intensity of the exercise over time. Your health care provider or diabetes educator can help create an activity plan that works for you. General recommendations include:  Encouraging children to engage in at least 60 minutes of physical activity each day.  Stretching and performing strength training exercises, such as yoga or weight lifting, at least 2 times per week.  Performing a total of at least 150 minutes of moderate-intensity exercise each week, such as brisk walking or water aerobics.  Exercising at least 3 days per week, making sure you allow no more than 2 consecutive days to pass without exercising.  Avoiding long periods of inactivity (90 minutes or more). When you have to spend an extended period of time sitting down, take frequent breaks to walk or stretch. RECOMMENDATIONS FOR EXERCISING WITH TYPE 1 OR TYPE 2  DIABETES   Check your blood glucose before exercising. If blood glucose levels are greater than 240 mg/dL, check for urine ketones. Do not exercise if ketones are present.  Avoid injecting insulin into areas of the body that are going to be exercised. For example, avoid injecting insulin into:  The arms when playing tennis.  The legs when jogging.  Keep a record of:  Food intake before and after you exercise.  Expected peak times of insulin action.  Blood glucose levels before and after you exercise.  The type and amount of exercise you have done.  Review your records with your health care provider. Your health care provider will help you to develop guidelines for adjusting food intake and insulin amounts before and after exercising.  If you take insulin or oral hypoglycemic agents, watch for signs and symptoms of hypoglycemia. They include:  Dizziness.  Shaking.  Sweating.  Chills.  Confusion.  Drink plenty  of water while you exercise to prevent dehydration or heat stroke. Body water is lost during exercise and must be replaced.  Talk to your health care provider before starting an exercise program to make sure it is safe for you. Remember, almost any type of activity is better than none.   This information is not intended to replace advice given to you by your health care provider. Make sure you discuss any questions you have with your health care provider.   Document Released: 02/27/2004 Document Revised: 04/23/2015 Document Reviewed: 05/16/2013 Elsevier Interactive Patient Education 2016 [employer].  -  Tdap Vaccine (Tetanus, Diphtheria and Pertussis): What You Need to Know 1. Why get vaccinated? Tetanus, diphtheria and pertussis are very serious diseases. Tdap vaccine can protect [place] from these diseases. And, Tdap vaccine given to pregnant women can protect newborn babies against pertussis. TETANUS (Lockjaw) is rare in the [place] today. It causes painful  muscle tightening and stiffness, usually all over the body.  It can lead to tightening of muscles in the head and neck so you can't open your mouth, swallow, or sometimes even breathe. Tetanus kills about 1 out of 10 people who are infected even after receiving the best medical care. DIPHTHERIA is also rare in the [place] today. It can cause a thick coating to form in the back of the throat.  It can lead to breathing problems, heart failure, paralysis, and death. PERTUSSIS (Whooping Cough) causes severe coughing spells, which can cause difficulty breathing, vomiting and disturbed sleep.  It can also lead to weight loss, incontinence, and rib fractures. Up to 2 in 100 adolescents and 5 in 100 adults with pertussis are hospitalized or have complications, which could include pneumonia or death. These diseases are caused by bacteria. Diphtheria and pertussis are spread from person to person through secretions from coughing or sneezing. Tetanus enters the body through cuts, scratches, or wounds. Before vaccines, as many as 200,000 cases of diphtheria, 200,000 cases of pertussis, and hundreds of cases of tetanus, were reported in the [place] each year. Since vaccination began, reports of cases for tetanus and diphtheria have dropped by about 99% and for pertussis by about 80%. 2. Tdap vaccine Tdap vaccine can protect adolescents and adults from tetanus, diphtheria, and pertussis. One dose of Tdap is routinely given at age 74 or 58. People who did not get Tdap at that age should get it as soon as possible. Tdap is especially important for healthcare professionals and anyone having close contact with a baby younger than 12 months. Pregnant women should get a dose of Tdap during every pregnancy, to protect the newborn from pertussis. Infants are most at risk for severe, life-threatening complications from pertussis. Another vaccine, called Td, protects against tetanus and diphtheria, but not  pertussis. A Td booster should be given every 10 years. Tdap may be given as one of these boosters if you have never gotten Tdap before. Tdap may also be given after a severe cut or burn to prevent tetanus infection. Your doctor or the person giving you the vaccine can give you more information. Tdap may safely be given at the same time as other vaccines. 3. Some people should not get this vaccine  A person who has ever had a life-threatening allergic reaction after a previous dose of any diphtheria, tetanus or pertussis containing vaccine, OR has a severe allergy to any part of this vaccine, should not get Tdap vaccine. Tell the person giving the vaccine about any  severe allergies.  Anyone who had coma or long repeated seizures within 7 days after a childhood dose of DTP or DTaP, or a previous dose of Tdap, should not get Tdap, unless a cause other than the vaccine was found. They can still get Td.  Talk to your doctor if you:  have seizures or another nervous system problem,  had severe pain or swelling after any vaccine containing diphtheria, tetanus or pertussis,  ever had a condition called Guillain-Barr Syndrome (GBS),  aren't feeling well on the day the shot is scheduled. 4. Risks With any medicine, including vaccines, there is a chance of side effects. These are usually mild and go away on their own. Serious reactions are also possible but are rare. Most people who get Tdap vaccine do not have any problems with it. Mild problems following Tdap (Did not interfere with activities)  Pain where the shot was given (about 3 in 4 adolescents or 2 in 3 adults)  Redness or swelling where the shot was given (about 1 person in 5)  Mild fever of at least 100.44F (up to about 1 in 25 adolescents or 1 in 100 adults)  Headache (about 3 or 4 people in 10)  Tiredness (about 1 person in 3 or 4)  Nausea, vomiting, diarrhea, stomach ache (up to 1 in 4 adolescents or 1 in 10 adults)  Chills,  sore joints (about 1 person in 10)  Body aches (about 1 person in 3 or 4)  Rash, swollen glands (uncommon) Moderate problems following Tdap (Interfered with activities, but did not require medical attention)  Pain where the shot was given (up to 1 in 5 or 6)  Redness or swelling where the shot was given (up to about 1 in 16 adolescents or 1 in 12 adults)  Fever over 102F (about 1 in 100 adolescents or 1 in 250 adults)  Headache (about 1 in 7 adolescents or 1 in 10 adults)  Nausea, vomiting, diarrhea, stomach ache (up to 1 or 3 people in 100)  Swelling of the entire arm where the shot was given (up to about 1 in 500). Severe problems following Tdap (Unable to perform usual activities; required medical attention)  Swelling, severe pain, bleeding and redness in the arm where the shot was given (rare). Problems that could happen after any vaccine:  People sometimes faint after a medical procedure, including vaccination. Sitting or lying down for about 15 minutes can help prevent fainting, and injuries caused by a fall. Tell your doctor if you feel dizzy, or have vision changes or ringing in the ears.  Some people get severe pain in the shoulder and have difficulty moving the arm where a shot was given. This happens very rarely.  Any medication can cause a severe allergic reaction. Such reactions from a vaccine are very rare, estimated at fewer than 1 in a million doses, and would happen within a few minutes to a few hours after the vaccination. As with any medicine, there is a very remote chance of a vaccine causing a serious injury or death. The safety of vaccines is always being monitored. For more information, visit: http://floyd.org/ 5. What if there is a serious problem? What should I look for?  Look for anything that concerns you, such as signs of a severe allergic reaction, very high fever, or unusual behavior.  Signs of a severe allergic reaction can include hives,  swelling of the face and throat, difficulty breathing, a fast heartbeat, dizziness, and weakness. These would  usually start a few minutes to a few hours after the vaccination. What should I do?  If you think it is a severe allergic reaction or other emergency that can't wait, call 9-1-1 or get the person to the nearest hospital. Otherwise, call your doctor.  Afterward, the reaction should be reported to the Vaccine Adverse Event Reporting System (VAERS). Your doctor might file this report, or you can do it yourself through the VAERS web site at www.vaers.LAgents.no, or by calling 1-954-254-6714. VAERS does not give medical advice.  6. The National Vaccine Injury Compensation Program The [employer] Vaccine Injury Compensation Program (VICP) is a federal program that was created to compensate people who may have been injured by certain vaccines. Persons who believe they may have been injured by a vaccine can learn about the program and about filing a claim by calling 1-415-480-4264 or visiting the VICP website at SpiritualWord.at. There is a time limit to file a claim for compensation. 7. How can I learn more?  Ask your doctor. He or she can give you the vaccine package insert or suggest other sources of information.  Call your local or state health department.  Contact the Centers for Disease Control and Prevention (CDC):  Call 3345532640 (1-800-CDC-INFO) or  Visit CDC's website at PicCapture.uy CDC Tdap Vaccine VIS (02/13/14)   This information is not intended to replace advice given to you by your health care provider. Make sure you discuss any questions you have with your health care provider.   Document Released: 06/07/2012 Document Revised: 12/28/2014 Document Reviewed: 03/21/2014 Elsevier Interactive Patient Education [employer]

## 2016-08-14 ENCOUNTER — Telehealth: Payer: Self-pay | Admitting: Internal Medicine

## 2016-08-14 NOTE — Telephone Encounter (Signed)
Will forward to pcp

## 2016-08-14 NOTE — Telephone Encounter (Signed)
Pt calling stating she visited her OB/GYN today and her blood pressure was elevated Pt wanting to know if she should make an OV with PCP to change her medications or if there is something else she can do   Please assist, thank you

## 2016-08-18 NOTE — Telephone Encounter (Signed)
Yes, please as her to make OV with me for bp chk. thx

## 2016-08-18 NOTE — Telephone Encounter (Signed)
Could you call pt and schedule and office visit please

## 2016-08-21 DIAGNOSIS — Z95820 Peripheral vascular angioplasty status with implants and grafts: Secondary | ICD-10-CM

## 2016-08-21 DIAGNOSIS — I214 Non-ST elevation (NSTEMI) myocardial infarction: Secondary | ICD-10-CM

## 2016-08-21 HISTORY — DX: Non-ST elevation (NSTEMI) myocardial infarction: I21.4

## 2016-08-21 HISTORY — DX: Peripheral vascular angioplasty status with implants and grafts: Z95.820

## 2016-09-06 ENCOUNTER — Inpatient Hospital Stay (HOSPITAL_COMMUNITY)
Admission: EM | Admit: 2016-09-06 | Discharge: 2016-09-09 | DRG: 247 | Disposition: A | Discharge status: Home / Self Care | Payer: Medicaid Other | Attending: Interventional Cardiology | Admitting: Interventional Cardiology

## 2016-09-06 ENCOUNTER — Encounter (HOSPITAL_COMMUNITY): Payer: Self-pay

## 2016-09-06 ENCOUNTER — Emergency Department (HOSPITAL_COMMUNITY): Payer: Medicaid Other

## 2016-09-06 DIAGNOSIS — R079 Chest pain, unspecified: Secondary | ICD-10-CM | POA: Diagnosis not present

## 2016-09-06 DIAGNOSIS — Z6841 Body Mass Index (BMI) 40.0 and over, adult: Secondary | ICD-10-CM

## 2016-09-06 DIAGNOSIS — I119 Hypertensive heart disease without heart failure: Secondary | ICD-10-CM | POA: Diagnosis present

## 2016-09-06 DIAGNOSIS — I252 Old myocardial infarction: Secondary | ICD-10-CM

## 2016-09-06 DIAGNOSIS — E089 Diabetes mellitus due to underlying condition without complications: Secondary | ICD-10-CM | POA: Diagnosis present

## 2016-09-06 DIAGNOSIS — I255 Ischemic cardiomyopathy: Secondary | ICD-10-CM | POA: Diagnosis present

## 2016-09-06 DIAGNOSIS — E662 Morbid (severe) obesity with alveolar hypoventilation: Secondary | ICD-10-CM | POA: Diagnosis present

## 2016-09-06 DIAGNOSIS — E785 Hyperlipidemia, unspecified: Secondary | ICD-10-CM | POA: Diagnosis present

## 2016-09-06 DIAGNOSIS — Z888 Allergy status to other drugs, medicaments and biological substances status: Secondary | ICD-10-CM

## 2016-09-06 DIAGNOSIS — I25119 Atherosclerotic heart disease of native coronary artery with unspecified angina pectoris: Secondary | ICD-10-CM

## 2016-09-06 DIAGNOSIS — Z8249 Family history of ischemic heart disease and other diseases of the circulatory system: Secondary | ICD-10-CM

## 2016-09-06 DIAGNOSIS — I214 Non-ST elevation (NSTEMI) myocardial infarction: Principal | ICD-10-CM

## 2016-09-06 DIAGNOSIS — E118 Type 2 diabetes mellitus with unspecified complications: Secondary | ICD-10-CM

## 2016-09-06 DIAGNOSIS — R7989 Other specified abnormal findings of blood chemistry: Secondary | ICD-10-CM

## 2016-09-06 DIAGNOSIS — J189 Pneumonia, unspecified organism: Secondary | ICD-10-CM

## 2016-09-06 DIAGNOSIS — J180 Bronchopneumonia, unspecified organism: Secondary | ICD-10-CM

## 2016-09-06 DIAGNOSIS — D6489 Other specified anemias: Secondary | ICD-10-CM | POA: Diagnosis present

## 2016-09-06 DIAGNOSIS — J45998 Other asthma: Secondary | ICD-10-CM | POA: Diagnosis present

## 2016-09-06 DIAGNOSIS — E1165 Type 2 diabetes mellitus with hyperglycemia: Secondary | ICD-10-CM | POA: Diagnosis present

## 2016-09-06 DIAGNOSIS — I10 Essential (primary) hypertension: Secondary | ICD-10-CM

## 2016-09-06 DIAGNOSIS — Z7982 Long term (current) use of aspirin: Secondary | ICD-10-CM

## 2016-09-06 DIAGNOSIS — Z833 Family history of diabetes mellitus: Secondary | ICD-10-CM

## 2016-09-06 DIAGNOSIS — Z794 Long term (current) use of insulin: Secondary | ICD-10-CM

## 2016-09-06 DIAGNOSIS — E139 Other specified diabetes mellitus without complications: Secondary | ICD-10-CM

## 2016-09-06 DIAGNOSIS — J069 Acute upper respiratory infection, unspecified: Secondary | ICD-10-CM | POA: Diagnosis present

## 2016-09-06 DIAGNOSIS — Z886 Allergy status to analgesic agent status: Secondary | ICD-10-CM

## 2016-09-06 DIAGNOSIS — Z79899 Other long term (current) drug therapy: Secondary | ICD-10-CM

## 2016-09-06 DIAGNOSIS — E1142 Type 2 diabetes mellitus with diabetic polyneuropathy: Secondary | ICD-10-CM | POA: Diagnosis present

## 2016-09-06 DIAGNOSIS — I2510 Atherosclerotic heart disease of native coronary artery without angina pectoris: Secondary | ICD-10-CM | POA: Diagnosis present

## 2016-09-06 DIAGNOSIS — R778 Other specified abnormalities of plasma proteins: Secondary | ICD-10-CM

## 2016-09-06 DIAGNOSIS — Z955 Presence of coronary angioplasty implant and graft: Secondary | ICD-10-CM

## 2016-09-06 LAB — CBC
HEMATOCRIT: 36.3 % (ref 36.0–46.0)
Hemoglobin: 11.8 g/dL — ABNORMAL LOW (ref 12.0–15.0)
MCH: 25.9 pg — AB (ref 26.0–34.0)
MCHC: 32.5 g/dL (ref 30.0–36.0)
MCV: 79.8 fL (ref 78.0–100.0)
PLATELETS: 208 10*3/uL (ref 150–400)
RBC: 4.55 MIL/uL (ref 3.87–5.11)
RDW: 13.7 % (ref 11.5–15.5)
WBC: 7.3 10*3/uL (ref 4.0–10.5)

## 2016-09-06 LAB — I-STAT BETA HCG BLOOD, ED (MC, WL, AP ONLY): I-stat hCG, quantitative: <5 m[IU]/mL (ref <5)

## 2016-09-06 LAB — I-STAT TROPONIN, ED
Troponin i, poc: 0.02 ng/mL (ref 0.00–0.08)
Troponin i, poc: 0.96 ng/mL (ref 0.00–0.08)

## 2016-09-06 LAB — BASIC METABOLIC PANEL
Anion gap: 7 (ref 5–15)
BUN: 14 mg/dL (ref 6–20)
CHLORIDE: 103 mmol/L (ref 101–111)
CO2: 25 mmol/L (ref 22–32)
Calcium: 8.9 mg/dL (ref 8.9–10.3)
Creatinine, Ser: 0.77 mg/dL (ref 0.44–1.00)
GFR calc Af Amer: >60 mL/min (ref >60)
GFR calc non Af Amer: >60 mL/min (ref >60)
GLUCOSE: 401 mg/dL — AB (ref 65–99)
POTASSIUM: 4.3 mmol/L (ref 3.5–5.1)
Sodium: 135 mmol/L (ref 135–145)

## 2016-09-06 LAB — TROPONIN I: TROPONIN I: 1.65 ng/mL — AB (ref <0.03)

## 2016-09-06 LAB — D-DIMER, QUANTITATIVE (NOT AT ARMC)

## 2016-09-06 MED ORDER — IOPAMIDOL (ISOVUE-370) INJECTION 76%
100.0000 mL | Freq: Once | INTRAVENOUS | Status: AC | PRN
  Administered 2016-09-06: 100 mL via INTRAVENOUS

## 2016-09-06 MED ORDER — MORPHINE SULFATE (PF) 2 MG/ML IV SOLN
2.0000 mg | Freq: Once | INTRAVENOUS | Status: AC
  Administered 2016-09-06: 2 mg via INTRAVENOUS
  Filled 2016-09-06: qty 1

## 2016-09-06 MED ORDER — HYDROMORPHONE HCL 1 MG/ML IJ SOLN
1.0000 mg | Freq: Once | INTRAMUSCULAR | Status: AC
  Administered 2016-09-06: 1 mg via INTRAVENOUS
  Filled 2016-09-06: qty 1

## 2016-09-06 MED ORDER — SODIUM CHLORIDE 0.9 % IV BOLUS (SEPSIS)
1000.0000 mL | Freq: Once | INTRAVENOUS | Status: AC
  Administered 2016-09-06: 1000 mL via INTRAVENOUS

## 2016-09-06 NOTE — ED Provider Notes (Signed)
[facility] DEPT Provider Note   CSN: [id number] Arrival date & time: 09/06/16  1538     History   Chief Complaint Chief Complaint  Patient presents with  . Chest Pain    HPI Lindsay [name] is a 35 y.o. female.  Lindsay [name] is a 35 y.o. female with h/o asthma, T2DM with neuropathy, HTN, and obesity presents to ED via EMS with complaint of chest pain. Pain started approximately 1hr PTA after playing with kids, 10/10, sharp, constant, central chest pain. Denies radiation to neck, arms, or back. Denies pressure sensation or tightness. She complains of associated shortness of breath, wheezing, non-productive cough, dizziness, headache, and nausea. Pain is worse with deep inspiration, movement, and laying down.  She called EMS and took 4 baby ASA with no relief; she was subsequently transported via EMS. No recent trauma. Denies fever, trouble swallowing, changes in vision, abdominal pain, vomiting, dysuria, hematuria, rashes. She has chronic b/l lower extremity swelling. She does have a history of asthma, although states this is different. No recent long distance travel/surgery/hospitalization, h/o blood clot, h/o cancer, hemoptysis. She has chronic b/l lower extremity swelling. Started Depo shot this month. No personal h/o cardiac conditions. Family h/o of cardiac conditions. Of note patient has had recent changes to her HTN medications. She was initially on lisinopril; however, was experiencing chest pain and shortness of breath. She was d/c'd on lisinopril and started on HCTZ. However, patient still experiencing shortness of breath and was not diuresing as well. Per pt, provider d/c'd HCTZ approximately one week ago and pt is scheduled to be seen for further management of her HTN.       Past Medical History:  Diagnosis Date  . ARF (acute renal failure) ([place]) 04/2015  . Asthma   . Cellulitis of right upper extremity   . Diabetes mellitus    insulin dependent  . Hypertension     . Obesity     Patient Active Problem List   Diagnosis Date Noted  . Chest pain 09/07/2016  . Community acquired pneumonia 09/07/2016  . Elevated troponin 09/07/2016  . DM neuropathy, type II diabetes mellitus ([place]) 06/10/2016  . Morbidly obese ([place]) 06/10/2016  . Pressure ulcer 05/17/2015  . Weakness 05/16/2015  . Generalized weakness 05/15/2015  . 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 Cellulitis of right upper arm   . ARF (acute renal failure) ([place])   . Septic arthritis ([place])   . HTN (hypertension) 04/30/2015  . Cellulitis 04/30/2015  . Hyponatremia 04/30/2015  . Abscess   . Abscess of right shoulder   . Diabetes mellitus due to underlying condition without complications ([place]) 17/49/4496  . Obesity 07/16/2014  . Cellulitis of left upper extremity 07/16/2014    Past Surgical History:  Procedure Laterality Date  . CESAREAN SECTION    . IRRIGATION AND DEBRIDEMENT SHOULDER Right 04/30/2015   Procedure: IRRIGATION AND DEBRIDEMENT SHOULDER;  Surgeon: Renette Butters, MD;  Location: [facility];  Service: Orthopedics;  Laterality: Right;  . IRRIGATION AND DEBRIDEMENT SHOULDER Right 05/01/2015  . LEG SURGERY    . SHOULDER ARTHROSCOPY Right 04/30/2015   Procedure: ARTHROSCOPY SHOULDER;  Surgeon: Renette Butters, MD;  Location: [facility];  Service: Orthopedics;  Laterality: Right;  . TONSILLECTOMY      OB History    No data available       Home Medications    Prior to Admission medications   Medication Sig Start Date End Date Taking? Authorizing Provider  ACCU-CHEK SOFTCLIX LANCETS lancets  Use as instructed for 4 times daily blood glucose monitoring 06/23/16  Yes [name], MD  albuterol (PROVENTIL HFA;VENTOLIN HFA) 108 (90 Base) MCG/ACT inhaler Inhale 2 puffs into the lungs every 6 (six) hours as needed for wheezing or shortness of breath. 06/10/16  Yes [name], MD  amLODipine (NORVASC) 10 MG tablet Take 1 tablet (10 mg total) by mouth daily. 06/10/16  Yes [name], MD   aspirin 81 MG tablet Take 81 mg by mouth daily.   Yes Historical Provider, MD  Blood Glucose Monitoring Suppl (ACCU-CHEK AVIVA PLUS) w/Device KIT Use as directed for 4 times daily blood glucose monitoring 06/23/16  Yes [name], MD  gabapentin (NEURONTIN) 300 MG capsule Take 1 capsule (300 mg total) by mouth 3 (three) times daily. 07/28/16  Yes [name], MD  glucose blood (ACCU-CHEK AVIVA PLUS) test strip Use as instructed for 4 times daily blood glucose monitoring 06/17/16  Yes [name], MD  glucose blood (ACCU-CHEK AVIVA) test strip Use as instructed 07/15/16  Yes [name], MD  insulin aspart (NOVOLOG) 100 UNIT/ML FlexPen Inject 10 Units into the skin 3 (three) times daily with meals. Patient taking differently: Inject 20 Units into the skin 3 (three) times daily with meals.  06/10/16  Yes [name], MD  insulin glargine (LANTUS) 100 unit/mL SOPN Inject 0.65 mLs (65 Units total) into the skin at bedtime. 07/28/16  Yes [name], MD  Insulin Pen Needle 31G X 5 MM MISC 1 application by Does not apply route Nightly. 07/28/16  Yes [name], MD  Lancet Devices [facility]) lancets Use as instructed for 4 times daily blood glucose monitoring 06/23/16  Yes [name], MD  medroxyPROGESTERone (DEPO-PROVERA) 150 MG/ML injection Inject 150 mg into the muscle every 3 (three) months.   Yes Historical Provider, MD  metFORMIN (GLUCOPHAGE) 1000 MG tablet Take 1 tablet (1,000 mg total) by mouth 2 (two) times daily with a meal. 06/10/16  Yes [name], MD  pravastatin (PRAVACHOL) 20 MG tablet Take 1 tablet (20 mg total) by mouth daily. 06/10/16  Yes [name], MD  ferrous sulfate 325 (65 FE) MG tablet Take 1 tablet (325 mg total) by mouth 2 (two) times daily with a meal. Patient not taking: Reported on 09/06/2016 05/13/15   Domenic Polite, MD    Family History Family History  Problem Relation Age of Onset  . Diabetes Mother   . Hypertension  Mother   . Heart disease Mother   . Diabetes Father   . Heart disease Father   . Stroke Maternal Grandmother   . Cancer Maternal Grandmother     Social History Social History  Substance Use Topics  . Smoking status: Never Smoker  . Smokeless tobacco: Never Used  . Alcohol use Yes     Comment: socially     Allergies   Hydrazine yellow [tartrazine]; Lisinopril; and Tylenol [acetaminophen]   Review of Systems Review of Systems  Constitutional: Negative for fever.  HENT: Negative for trouble swallowing.   Eyes: Negative for visual disturbance.  Respiratory: Positive for cough, shortness of breath and wheezing.   Cardiovascular: Positive for chest pain and leg swelling ( b/l).  Gastrointestinal: Negative for abdominal pain, constipation, diarrhea, nausea and vomiting.  Genitourinary: Negative for dysuria and hematuria.  Musculoskeletal: Negative for back pain and neck pain.  Skin: Negative for rash.  Neurological: Positive for dizziness, numbness ( chronic neuropathy secondary to DM)  and headaches. Negative for syncope, facial asymmetry, speech difficulty and weakness.     Physical Exam Updated Vital Signs BP 136/91   Pulse 83   Temp 99.2 F (37.3 C) (Oral)   Resp 14   LMP 08/04/2016   SpO2 99%   Physical Exam  Constitutional: She appears well-developed and well-nourished. No distress.  HENT:  Head: Normocephalic and atraumatic.  Mouth/Throat: Oropharynx is clear and moist. No oropharyngeal exudate.  Eyes: Conjunctivae and EOM are normal. Pupils are equal, round, and reactive to light. Right eye exhibits no discharge. Left eye exhibits no discharge. No scleral icterus.  Neck: Normal range of motion and phonation normal. Neck supple. No neck rigidity. Normal range of motion present.  Cardiovascular: Regular rhythm, normal heart sounds and intact distal pulses.  Tachycardia present.   No murmur heard. Pulmonary/Chest: Effort normal and breath sounds normal. No  stridor. Tachypnea noted. No respiratory distress. She has no wheezes. She has no rales.  Breathing is labored, speaking short phrases  Abdominal: Soft. Bowel sounds are normal. She exhibits no distension. There is no tenderness. There is no rigidity, no rebound, no guarding and no CVA tenderness.  Musculoskeletal: Normal range of motion.  Lymphadenopathy:    She has no cervical adenopathy.  Neurological: She is alert. She is not disoriented. Coordination and gait normal. GCS eye subscore is 4. GCS verbal subscore is 5. GCS motor subscore is 6.  Mental Status:  Alert, thought content appropriate, able to give a coherent history. Speech fluent without evidence of aphasia. Able to follow 2 step commands without difficulty.  Cranial Nerves:  II: pupils equal, round, reactive to light III,IV, VI: ptosis not present, extra-ocular motions intact bilaterally  V,VII: smile symmetric, facial light touch sensation equal VIII: hearing grossly normal to voice  X: uvula elevates symmetrically  XI: bilateral shoulder shrug symmetric and strong XII: midline tongue extension without fassiculations Motor:  Normal tone. 5/5 in upper and lower extremities bilaterally including strong and equal grip strength and dorsiflexion/plantar flexion Sensory: decrease sensation in RUE (chronic in nature) Cerebellar: normal finger-to-nose with bilateral upper extremities EY:CXKGYJ pulses palpable throughout   Skin: Skin is warm and dry. She is not diaphoretic.  Psychiatric: She has a normal mood and affect. Her behavior is normal.     ED Treatments / Results  Labs (all labs ordered are listed, but only abnormal results are displayed) Labs Reviewed  BASIC METABOLIC PANEL - Abnormal; Notable for the following:       Result Value   Glucose, Bld 401 (*)    All other components within normal limits  CBC - Abnormal; Notable for the following:    Hemoglobin 11.8 (*)    MCH 25.9 (*)    All other components within  normal limits  TROPONIN I - Abnormal; Notable for the following:    Troponin I 1.65 (*)    All other components within normal limits  I-STAT TROPOININ, ED - Abnormal; Notable for the following:    Troponin i, poc 0.96 (*)    All other components within normal limits  D-DIMER, QUANTITATIVE (NOT AT [facility])  URINALYSIS, ROUTINE W REFLEX MICROSCOPIC (NOT AT [facility])  TROPONIN I  TROPONIN I  I-STAT TROPOININ, ED  I-STAT BETA HCG BLOOD, ED ([facility], [facility], [facility] ONLY)    EKG  EKG Interpretation  Date/Time:  Sunday September 06 2016 15:58:45 EDT Ventricular Rate:  103 PR Interval:    QRS Duration: 86 QT Interval:  362 QTC Calculation: 474 R Axis:  14 Text Interpretation:  Sinus tachycardia Borderline T wave abnormalities agree. no STEMI. dynamic inferior t wave. Confirmed by Johnney Killian, MD, Jeannie Done [phone]) on 09/06/2016 8:16:54 PM       Radiology Dg Chest 2 View  Result Date: 09/06/2016 CLINICAL DATA:  Acute chest pain and shortness of breath today. EXAM: CHEST  2 VIEW COMPARISON:  12/14/2014 and 06/10/2011 radiographs FINDINGS: The cardiomediastinal silhouette is unremarkable. Mild peribronchial thickening has slightly increased. There is no evidence of focal airspace disease, pulmonary edema, suspicious pulmonary nodule/mass, pleural effusion, or pneumothorax. No acute bony abnormalities are identified. IMPRESSION: Slightly increasing mild peribronchial thickening. No evidence of focal airspace disease. Electronically Signed   By: Margarette Canada M.D.   On: 09/06/2016 17:35   Ct Angio Chest/abd/pel For Dissection W And/or W/wo  Result Date: 09/07/2016 CLINICAL DATA:  35 y/o F; sharp central chest pain with dizziness and shortness of breath. EXAM: CT ANGIOGRAPHY CHEST, ABDOMEN AND PELVIS TECHNIQUE: Multidetector CT imaging through the chest, abdomen and pelvis was performed using the standard protocol during bolus administration of intravenous contrast. Multiplanar reconstructed images and MIPs were obtained  and reviewed to evaluate the vascular anatomy. CONTRAST:  200 cc Isovue 370.  A COMPARISON:  None. FINDINGS: CTA CHEST FINDINGS Normal heart size. No pericardial effusion. Mild coronary artery calcifications. Normal size main pulmonary artery. Normal caliber of thoracic aorta. No evidence of dissection, penetrating ulcer, or intramural hematoma of the thoracic aorta. No mediastinal lymphadenopathy. There are small clusters of nodules within the apical and basal posterior segments of the left lower lobe compatible with early bronchopneumonia. Lungs are otherwise clear. No pleural effusion or pneumothorax. No acute osseous abnormality or suspicious bone lesion is identified. Review of the MIP images confirms the above findings. CTA ABDOMEN AND PELVIS FINDINGS Nonspecific calcifications within the spleen and single calcification within left lobe of liver likely represents sequelae of prior granulomatous disease. The liver is otherwise unremarkable. Normal gallbladder. No biliary ductal dilatation. No appreciable mass or inflammatory changes of the pancreas. Borderline splenomegaly.  Calcified granulomas of spleen. Normal adrenal glands. No focal lesion or inflammatory changes of the kidneys. No kidney stone or obstructive uropathy. The stomach is unremarkable. No obstructive or inflammatory changes of the bowel. Normal appendix. No lymphadenopathy. Normal caliber thoracic abdominal aorta. No evidence of otherwise unremarkable CT of the chest abdomen and pelvis for age. Dissection, penetrating ulcer, aneurysm, or intramural hematoma. Good opacification of the portal venous system. No suspicious osseous lesion or acute osseous abnormality. Uterus and adnexa are unremarkable. Review of the MIP images confirms the above findings. IMPRESSION: 1. No aneurysm or dissection of the aorta is identified. No acute vascular abnormality. 2. Clusters of nodules in the left lower lobe are probably represent early bronchopneumonia.  These results were called by telephone at the time of interpretation on 09/07/2016 at 1:01 am to [facility], who verbally acknowledged these results. Electronically Signed   By: Kristine Garbe M.D.   On: 09/07/2016 01:01    Procedures Procedures (including critical care time)  Medications Ordered in ED Medications  insulin aspart (novoLOG) injection 0-15 Units (not administered)  morphine 2 MG/ML injection 2 mg (2 mg Intravenous Given 09/06/16 1753)  sodium chloride 0.9 % bolus 1,000 mL (0 mLs Intravenous Stopped 09/06/16 2315)  morphine 2 MG/ML injection 2 mg (2 mg Intravenous Given 09/06/16 2026)  iopamidol (ISOVUE-370) 76 % injection 100 mL (100 mLs Intravenous Contrast Given 09/06/16 2229)  HYDROmorphone (DILAUDID) injection 1 mg (1 mg Intravenous Given 09/06/16 2323)  Initial Impression / Assessment and Plan / ED Course  I have reviewed the triage vital signs and the nursing notes.  Pertinent labs & imaging results that were available during my care of the patient were reviewed by me and considered in my medical decision making (see chart for details).  Clinical Course  Value Comment By Time  DG Chest 2 View No evidence of PNA, pleural effusion, or PTX.  Roxanna Mew, [place] 09/17 1922   On re-evaluation patient endorses slight improvement in pain.  Roxanna Mew, [place] 09/17 2417   Discussed results and plan with patient. On re-evaluation patient endorses continued chest pain. IV pain medication redosed. Roxanna Mew, [place] 09/17 2030  CT Angio Chest/Abd/Pel for Dissection W and/or W/WO Reviewed Roxanna Mew, PA-C 09/18 0115    Patient presents to ED complaint of chest pain. Patient is afebrile and non-toxic appearing. She is tachycardiac and tachypneic, speaking in short phrases between breaths. O2 sats 100%. Pressure mildly elevated. Lungs are clear to auscultation. No abnormal heart sounds. Abdomen is soft and non-tender. No focal neurologic  deficits. Will check labs, EKG, CXR. IVF and pain medication initiated.   CBC remarkable for mild anemia - stable compared to previous. CMP remarkable for elevated sugars at 401, no AG. Well's 1.5, d-dimer nml - low suspicion for PE. Initial troponin 0.02. CXR negative for pulmonary edema, consolidation, pleural effusion, or PTX. EKG remarkable for sinus tachycardia with dynamic inferior t wave. Will repeat troponin.   8:28 PM: Repeat troponin positive at 0.96. Repeat EKG shows no acute changes or evidence of STEMI. Confirmatory troponin 1.65. Consult to cardiology placed.   8:38 PM: Spoke with Dr. Wynonia Lawman of Cardiology, greatly appreciated his time and input. Will see patient.   10:02 PM: Spoke with Dr. Wynonia Lawman, recommend medical admission for management of DM and HTN, transfer to [facility], likely need catheterization in AM. Will obtain CT dissection study. If dissection study negative initiate IV heparin and NTG.  CT negative for dissection. Of note, findings concerning for possible early bronchopneumonia in left lower lobe. Consult to hospitalist placed for admission for elevated troponin and left lower lobe bronchopneumonia.   1:41 AM: Spoke with Dr. Vista Lawman of [facility], greatly appreciate his time and input. Patient to be admitted for further management of elevated troponin, LLL bronchopneumonia, and co-morbid medical conditions.  Final Clinical Impressions(s) / ED Diagnoses   Final diagnoses:  Bronchopneumonia  Elevated troponin  Chest pain, unspecified chest pain type  Essential hypertension    New Prescriptions New Prescriptions   No medications on file     Roxanna Mew, PA-C 09/07/16 0210    Charlesetta Shanks, MD 09/08/16 [phone]

## 2016-09-06 NOTE — ED Notes (Signed)
Informed PA of troponin result.

## 2016-09-06 NOTE — ED Notes (Signed)
Pt Istat troponin = 0.96, PA Daphane Shepherd

## 2016-09-06 NOTE — ED Notes (Signed)
MD and PA aware of patient status.

## 2016-09-06 NOTE — ED Triage Notes (Signed)
She states that about an hour p.t.a. She experienced "sharp" central chest pain with dizziness and shortness of breath. She states she phoned 911 and per their advisement, she took 4 baby aspirin. She arrives here in no distress. EKG performed.

## 2016-09-06 NOTE — ED Notes (Signed)
Patient transported to CT 

## 2016-09-06 NOTE — Consult Note (Signed)
Cardiology Consult Note  Admit date: 09/06/2016 Name: Lindsay [name] 35 y.o.  female DOB:  [birth year] MRN:  [id number]  Today's date:  09/06/2016  Referring Physician:   [facility]  Primary Physician:    [facility] health care clinic  Reason for Consultation:    Chest pain, abnormal troponin  IMPRESSIONS: 1.  Atypical sharp chest discomfort in a patient with long-standing diabetes with complications but with elevated troponin and could be non-STEMI or cardiac ischemia although chest pain story is atypical and EKG is unremarkable 2.  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  Hypertensive heart disease blood pressure currently not well controlled 4.  Hyperlipidemia 5.  Morbid obesity 6.  History of ATN in the setting of septic arthritis and vancomycin use 7.  History of hepatitis B  RECOMMENDATION: 1.  Transferred to [facility] on the internal medicine service 2.  Agree with study to look for aortic dissection if no dissection seen would initiate intravenous heparin and cycle serial cardiac enzymes, begin IV nitroglycerin 3.  Continue to cycle cardiac enzymes 4.  Keep nothing by mouth after midnight and likely will need catheterization tomorrow  Cardiac catheterization was discussed with the patient fully including risks of myocardial infarction, death, stroke, bleeding, arrhythmia, dye allergy, renal insufficiency or bleeding.  The patient understands and is willing to proceed.  Possibility of intervention at the same time also discussed with patient and they understand and are agreeable to proceed  HISTORY: This 35 year old black female presented to the [facility] long emergency room with sharp chest pain.  The patient has a history of diabetes since age 42 and has peripheral neuropathy.  She had a hospitalization last year with septic arthritis and during that hospitalization developed ATN with creatinine going as high as 5.8 possibly  thought due to vancomycin.  Her renal function has since normalized.  She recently reestablished at the [facility] clinic and was started on lisinopril HCTZ but developed shortness of breath and chest pain that was sharp and the lisinopril component was discontinued.  She was cooking earlier this evening and had the onset of sharp midsternal chest discomfort that radiated somewhat to the left and the right side.  He was somewhat relieved with lying down and was worse when she sat up and was noted to be at least 4 out of 10.  She eventually called EMS when the pain became severe.  She noted it was somewhat pleuritic.  It did not radiate to her back.  She was mildly nauseated earlier.  The discomfort has persisted since she was here.  There were no initial acute EKG changes and she was mildly hypertensive on arrival.  She currently is complaining of 8 out of 10 sharp chest pain worse with deep breathing.  Initial troponin was not elevated but a subsequent troponin returned elevated at 0.94 although was a point of care one.  Blood pressure has not been well controlled.  She is relatively inactive and her diabetes has been poorly controlled.  She does have a history of asthma but is not currently wheezing.  Past Medical History:  Diagnosis Date  . ARF (acute renal failure) ([place]) 04/2015  . Asthma   . Cellulitis of right upper extremity   . Diabetes mellitus    insulin dependent  . Hypertension   . Obesity     Past Surgical History:  Procedure Laterality Date  . CESAREAN SECTION    . IRRIGATION AND DEBRIDEMENT SHOULDER Right 04/30/2015  Procedure: IRRIGATION AND DEBRIDEMENT SHOULDER;  Surgeon: Renette Butters, MD;  Location: [facility];  Service: Orthopedics;  Laterality: Right;  . IRRIGATION AND DEBRIDEMENT SHOULDER Right 05/01/2015  . LEG SURGERY    . SHOULDER ARTHROSCOPY Right 04/30/2015   Procedure: ARTHROSCOPY SHOULDER;  Surgeon: Renette Butters, MD;  Location: [facility];  Service: Orthopedics;   Laterality: Right;  . TONSILLECTOMY      Allergies:  is allergic to hydrazine yellow [tartrazine]; lisinopril; and tylenol [acetaminophen].   Medications: Prior to Admission medications   Medication Sig Start Date End Date Taking? Authorizing Provider  ACCU-CHEK SOFTCLIX LANCETS lancets Use as instructed for 4 times daily blood glucose monitoring 06/23/16  Yes [name], MD  albuterol (PROVENTIL HFA;VENTOLIN HFA) 108 (90 Base) MCG/ACT inhaler Inhale 2 puffs into the lungs every 6 (six) hours as needed for wheezing or shortness of breath. 06/10/16  Yes [name], MD  amLODipine (NORVASC) 10 MG tablet Take 1 tablet (10 mg total) by mouth daily. 06/10/16  Yes [name], MD  aspirin 81 MG tablet Take 81 mg by mouth daily.   Yes Historical Provider, MD  Blood Glucose Monitoring Suppl (ACCU-CHEK AVIVA PLUS) w/Device KIT Use as directed for 4 times daily blood glucose monitoring 06/23/16  Yes [name], MD  gabapentin (NEURONTIN) 300 MG capsule Take 1 capsule (300 mg total) by mouth 3 (three) times daily. 07/28/16  Yes [name], MD  glucose blood (ACCU-CHEK AVIVA PLUS) test strip Use as instructed for 4 times daily blood glucose monitoring 06/17/16  Yes [name], MD  glucose blood (ACCU-CHEK AVIVA) test strip Use as instructed 07/15/16  Yes [name], MD  insulin aspart (NOVOLOG) 100 UNIT/ML FlexPen Inject 10 Units into the skin 3 (three) times daily with meals. Patient taking differently: Inject 20 Units into the skin 3 (three) times daily with meals.  06/10/16  Yes [name], MD  insulin glargine (LANTUS) 100 unit/mL SOPN Inject 0.65 mLs (65 Units total) into the skin at bedtime. 07/28/16  Yes [name], MD  Insulin Pen Needle 31G X 5 MM MISC 1 application by Does not apply route Nightly. 07/28/16  Yes [name], MD  Lancet Devices [facility]) lancets Use as instructed for 4 times daily blood glucose monitoring 06/23/16  Yes [name], MD  medroxyPROGESTERone (DEPO-PROVERA) 150 MG/ML injection Inject 150 mg into the muscle every 3 (three) months.   Yes Historical Provider, MD  metFORMIN (GLUCOPHAGE) 1000 MG tablet Take 1 tablet (1,000 mg total) by mouth 2 (two) times daily with a meal. 06/10/16  Yes [name], MD  pravastatin (PRAVACHOL) 20 MG tablet Take 1 tablet (20 mg total) by mouth daily. 06/10/16  Yes [name], MD  ferrous sulfate 325 (65 FE) MG tablet Take 1 tablet (325 mg total) by mouth 2 (two) times daily with a meal. Patient not taking: Reported on 09/06/2016 05/13/15   Domenic Polite, MD  hydrALAZINE (APRESOLINE) 50 MG tablet Take 1 tablet (50 mg total) by mouth 3 (three) times daily. Patient not taking: Reported on 09/06/2016 07/28/16   [name], MD  metroNIDAZOLE (FLAGYL) 500 MG tablet Take 500 mg by mouth 2 (two) times daily.    Historical Provider, MD    Family History: Family Status  Relation Status  . Mother   . Father   . Maternal Grandmother     Social History:   reports that she has never  smoked. She has never used smokeless tobacco. She reports that she drinks alcohol. She reports that she does not use drugs.   Social History   Social History Narrative  . No narrative on file    Review of Systems: Morbidly obese for years.  No diabetic changes previously does have significant peripheral neuropathy.  She does have some mild diarrhea at times.  Skin has had previous chest wall abscess as well as infection.  She does have a mild history of some edema.  Other than as noted above remainder of the review of systems is unremarkable.  Physical Exam: BP 152/100   Pulse 92   Temp 99.2 F (37.3 C) (Oral)   Resp 25   LMP 08/04/2016   SpO2 100%   General appearance: Extremely obese black female lying in bed but in no acute distress despite complaining of 8 out of 10 chest pain, skin is not diaphoretic Head: Normocephalic, without obvious abnormality, atraumatic Eyes:  conjunctivae/corneas clear. PERRL, EOM's intact. Fundi Not examined  Neck: no adenopathy, no carotid bruit, no JVD and supple, symmetrical, trachea midline Lungs: clear to auscultation bilaterally Heart: regular rate and rhythm, S1, S2 normal, no murmur, click, rub or gallop Abdomen: soft, non-tender; bowel sounds normal; no masses,  no organomegaly Pelvic: deferred Extremities: extremities normal, atraumatic, no cyanosis or edema Pulses: 2+ and symmetric Neurologic: Grossly normal  Labs: CBC  Recent Labs  09/06/16 1620  WBC 7.3  RBC 4.55  HGB 11.8*  HCT 36.3  PLT 208  MCV 79.8  MCH 25.9*  MCHC 32.5  RDW 13.7   CMP   Recent Labs  09/06/16 1620  NA 135  K 4.3  CL 103  CO2 25  GLUCOSE 401*  BUN 14  CREATININE 0.77  CALCIUM 8.9  GFRNONAA >60  GFRAA >60   Cardiac Panel (last 3 results) Troponin (Point of Care Test)  Recent Labs  09/06/16 2001  TROPIPOC 0.96*    Radiology:  Peribronchial thickening, heart size was normal  EKG: Small Q waves laterally, no acute ST elevation or T-wave changes  Signed:  W. Doristine Church MD [facility]   Cardiology Consultant  09/06/2016, 9:52 PM

## 2016-09-06 NOTE — ED Notes (Signed)
Bed: ES97 Expected date: 09/06/16 Expected time: 3:35 PM Means of arrival: Ambulance Comments: Chest wall pain HTN

## 2016-09-06 NOTE — ED Notes (Signed)
Breathing even and unlabored.  Patient speaking in full sentences.

## 2016-09-07 ENCOUNTER — Inpatient Hospital Stay (HOSPITAL_COMMUNITY): Payer: Medicaid Other

## 2016-09-07 ENCOUNTER — Encounter (HOSPITAL_COMMUNITY)
Admission: EM | Disposition: A | Discharge status: Home / Self Care | Payer: Self-pay | Source: Home / Self Care | Attending: Interventional Cardiology

## 2016-09-07 ENCOUNTER — Encounter (HOSPITAL_COMMUNITY): Payer: Self-pay | Admitting: Internal Medicine

## 2016-09-07 DIAGNOSIS — E1165 Type 2 diabetes mellitus with hyperglycemia: Secondary | ICD-10-CM | POA: Diagnosis present

## 2016-09-07 DIAGNOSIS — E785 Hyperlipidemia, unspecified: Secondary | ICD-10-CM | POA: Diagnosis present

## 2016-09-07 DIAGNOSIS — Z794 Long term (current) use of insulin: Secondary | ICD-10-CM | POA: Diagnosis not present

## 2016-09-07 DIAGNOSIS — J069 Acute upper respiratory infection, unspecified: Secondary | ICD-10-CM | POA: Diagnosis present

## 2016-09-07 DIAGNOSIS — I214 Non-ST elevation (NSTEMI) myocardial infarction: Secondary | ICD-10-CM | POA: Diagnosis present

## 2016-09-07 DIAGNOSIS — E662 Morbid (severe) obesity with alveolar hypoventilation: Secondary | ICD-10-CM | POA: Diagnosis present

## 2016-09-07 DIAGNOSIS — J189 Pneumonia, unspecified organism: Secondary | ICD-10-CM | POA: Diagnosis not present

## 2016-09-07 DIAGNOSIS — I2510 Atherosclerotic heart disease of native coronary artery without angina pectoris: Secondary | ICD-10-CM | POA: Diagnosis present

## 2016-09-07 DIAGNOSIS — J45998 Other asthma: Secondary | ICD-10-CM | POA: Diagnosis present

## 2016-09-07 DIAGNOSIS — R079 Chest pain, unspecified: Secondary | ICD-10-CM | POA: Diagnosis not present

## 2016-09-07 DIAGNOSIS — R7989 Other specified abnormal findings of blood chemistry: Secondary | ICD-10-CM | POA: Diagnosis not present

## 2016-09-07 DIAGNOSIS — I252 Old myocardial infarction: Secondary | ICD-10-CM

## 2016-09-07 DIAGNOSIS — E1142 Type 2 diabetes mellitus with diabetic polyneuropathy: Secondary | ICD-10-CM | POA: Diagnosis present

## 2016-09-07 DIAGNOSIS — I255 Ischemic cardiomyopathy: Secondary | ICD-10-CM | POA: Diagnosis present

## 2016-09-07 DIAGNOSIS — Z6841 Body Mass Index (BMI) 40.0 and over, adult: Secondary | ICD-10-CM | POA: Diagnosis not present

## 2016-09-07 DIAGNOSIS — Z79899 Other long term (current) drug therapy: Secondary | ICD-10-CM | POA: Diagnosis not present

## 2016-09-07 DIAGNOSIS — Z8249 Family history of ischemic heart disease and other diseases of the circulatory system: Secondary | ICD-10-CM | POA: Diagnosis not present

## 2016-09-07 DIAGNOSIS — Z886 Allergy status to analgesic agent status: Secondary | ICD-10-CM | POA: Diagnosis not present

## 2016-09-07 DIAGNOSIS — Z833 Family history of diabetes mellitus: Secondary | ICD-10-CM | POA: Diagnosis not present

## 2016-09-07 DIAGNOSIS — E0800 Diabetes mellitus due to underlying condition with hyperosmolarity without nonketotic hyperglycemic-hyperosmolar coma (NKHHC): Secondary | ICD-10-CM | POA: Diagnosis not present

## 2016-09-07 DIAGNOSIS — R0789 Other chest pain: Secondary | ICD-10-CM

## 2016-09-07 DIAGNOSIS — E089 Diabetes mellitus due to underlying condition without complications: Secondary | ICD-10-CM | POA: Diagnosis not present

## 2016-09-07 DIAGNOSIS — J180 Bronchopneumonia, unspecified organism: Secondary | ICD-10-CM | POA: Diagnosis present

## 2016-09-07 DIAGNOSIS — Z888 Allergy status to other drugs, medicaments and biological substances status: Secondary | ICD-10-CM | POA: Diagnosis not present

## 2016-09-07 DIAGNOSIS — R778 Other specified abnormalities of plasma proteins: Secondary | ICD-10-CM | POA: Diagnosis present

## 2016-09-07 DIAGNOSIS — I119 Hypertensive heart disease without heart failure: Secondary | ICD-10-CM | POA: Diagnosis present

## 2016-09-07 DIAGNOSIS — Z7982 Long term (current) use of aspirin: Secondary | ICD-10-CM | POA: Diagnosis not present

## 2016-09-07 DIAGNOSIS — D6489 Other specified anemias: Secondary | ICD-10-CM | POA: Diagnosis present

## 2016-09-07 HISTORY — PX: CARDIAC CATHETERIZATION: SHX172

## 2016-09-07 LAB — URINE MICROSCOPIC-ADD ON: RBC / HPF: NONE SEEN RBC/hpf (ref 0–5)

## 2016-09-07 LAB — TROPONIN I: TROPONIN I: 4.74 ng/mL — AB (ref <0.03)

## 2016-09-07 LAB — CBC
HEMATOCRIT: 38 % (ref 36.0–46.0)
HEMATOCRIT: 34.7 % — AB (ref 36.0–46.0)
HEMOGLOBIN: 11.2 g/dL — AB (ref 12.0–15.0)
Hemoglobin: 12.1 g/dL (ref 12.0–15.0)
MCH: 25.6 pg — ABNORMAL LOW (ref 26.0–34.0)
MCH: 25.9 pg — ABNORMAL LOW (ref 26.0–34.0)
MCHC: 31.8 g/dL (ref 30.0–36.0)
MCHC: 32.3 g/dL (ref 30.0–36.0)
MCV: 80.3 fL (ref 78.0–100.0)
MCV: 80.3 fL (ref 78.0–100.0)
PLATELETS: 233 10*3/uL (ref 150–400)
PLATELETS: 208 10*3/uL (ref 150–400)
RBC: 4.73 MIL/uL (ref 3.87–5.11)
RBC: 4.32 MIL/uL (ref 3.87–5.11)
RDW: 13.9 % (ref 11.5–15.5)
RDW: 13.9 % (ref 11.5–15.5)
WBC: 8.2 10*3/uL (ref 4.0–10.5)
WBC: 7.6 10*3/uL (ref 4.0–10.5)

## 2016-09-07 LAB — URINALYSIS, ROUTINE W REFLEX MICROSCOPIC
Bilirubin Urine: NEGATIVE
HGB URINE DIPSTICK: NEGATIVE
KETONES UR: NEGATIVE mg/dL
Leukocytes, UA: NEGATIVE
Nitrite: NEGATIVE
PH: 5.5 (ref 5.0–8.0)
PROTEIN: NEGATIVE mg/dL
Specific Gravity, Urine: >1.046 — ABNORMAL HIGH (ref 1.005–1.030)

## 2016-09-07 LAB — GLUCOSE, CAPILLARY
GLUCOSE-CAPILLARY: 269 mg/dL — AB (ref 65–99)
Glucose-Capillary: 287 mg/dL — ABNORMAL HIGH (ref 65–99)
Glucose-Capillary: 285 mg/dL — ABNORMAL HIGH (ref 65–99)
Glucose-Capillary: 287 mg/dL — ABNORMAL HIGH (ref 65–99)
Glucose-Capillary: 293 mg/dL — ABNORMAL HIGH (ref 65–99)

## 2016-09-07 LAB — CREATININE, SERUM
Creatinine, Ser: 0.71 mg/dL (ref 0.44–1.00)
GFR calc non Af Amer: >60 mL/min (ref >60)

## 2016-09-07 LAB — PROTIME-INR
INR: 1.01
Prothrombin Time: 13.3 seconds (ref 11.4–15.2)

## 2016-09-07 LAB — LIPID PANEL
CHOL/HDL RATIO: 5 ratio
Cholesterol: 201 mg/dL — ABNORMAL HIGH (ref 0–200)
HDL: 40 mg/dL — ABNORMAL LOW (ref >40)
LDL Cholesterol: 117 mg/dL — ABNORMAL HIGH (ref 0–99)
Triglycerides: 219 mg/dL — ABNORMAL HIGH (ref <150)
VLDL: 44 mg/dL — AB (ref 0–40)

## 2016-09-07 LAB — APTT: aPTT: 25 seconds (ref 24–36)

## 2016-09-07 LAB — MRSA PCR SCREENING: MRSA BY PCR: NEGATIVE

## 2016-09-07 LAB — POCT ACTIVATED CLOTTING TIME: Activated Clotting Time: 489 seconds

## 2016-09-07 SURGERY — LEFT HEART CATH AND CORONARY ANGIOGRAPHY
Anesthesia: LOCAL

## 2016-09-07 MED ORDER — HEPARIN (PORCINE) IN NACL 2-0.9 UNIT/ML-% IJ SOLN
INTRAMUSCULAR | Status: AC
Start: 2016-09-07 — End: 2016-09-07
  Filled 2016-09-07: qty 1500

## 2016-09-07 MED ORDER — FENTANYL CITRATE (PF) 100 MCG/2ML IJ SOLN
INTRAMUSCULAR | Status: AC
  Filled 2016-09-07: qty 2

## 2016-09-07 MED ORDER — SODIUM CHLORIDE 0.9 % IV SOLN
INTRAVENOUS | Status: DC | PRN
  Administered 2016-09-07 (×2): 1.75 mg/kg/h via INTRAVENOUS

## 2016-09-07 MED ORDER — MIDAZOLAM HCL 2 MG/2ML IJ SOLN
INTRAMUSCULAR | Status: DC | PRN
  Administered 2016-09-07: 1 mg via INTRAVENOUS

## 2016-09-07 MED ORDER — SODIUM CHLORIDE 0.9% FLUSH
3.0000 mL | Freq: Two times a day (BID) | INTRAVENOUS | Status: DC
Start: 2016-09-07 — End: 2016-09-09
  Administered 2016-09-07 – 2016-09-09 (×4): 3 mL via INTRAVENOUS

## 2016-09-07 MED ORDER — ALPRAZOLAM 0.25 MG PO TABS: 0.2500 mg | ORAL_TABLET | Freq: Two times a day (BID) | ORAL | Status: DC | PRN

## 2016-09-07 MED ORDER — SODIUM CHLORIDE 0.9% FLUSH: 3.0000 mL | INTRAVENOUS | Status: DC | PRN

## 2016-09-07 MED ORDER — TICAGRELOR 90 MG PO TABS
90.0000 mg | ORAL_TABLET | Freq: Two times a day (BID) | ORAL | Status: DC
  Administered 2016-09-07 – 2016-09-09 (×4): 90 mg via ORAL
  Filled 2016-09-07 (×4): qty 1

## 2016-09-07 MED ORDER — ACETAMINOPHEN 325 MG PO TABS
650.0000 mg | ORAL_TABLET | ORAL | Status: DC | PRN
  Filled 2016-09-07: qty 2

## 2016-09-07 MED ORDER — DEXTROSE 5 % IV SOLN
500.0000 mg | Freq: Every day | INTRAVENOUS | Status: DC
  Administered 2016-09-07 – 2016-09-09 (×3): 500 mg via INTRAVENOUS
  Filled 2016-09-07 (×3): qty 500

## 2016-09-07 MED ORDER — OXYCODONE-ACETAMINOPHEN 5-325 MG PO TABS
1.0000 | ORAL_TABLET | ORAL | Status: DC | PRN
  Administered 2016-09-07: 2 via ORAL
  Filled 2016-09-07: qty 2

## 2016-09-07 MED ORDER — HEPARIN BOLUS VIA INFUSION
4000.0000 [IU] | Freq: Once | INTRAVENOUS | Status: AC
  Administered 2016-09-07: 4000 [IU] via INTRAVENOUS
  Filled 2016-09-07: qty 4000

## 2016-09-07 MED ORDER — HEPARIN SODIUM (PORCINE) 1000 UNIT/ML IJ SOLN
INTRAMUSCULAR | Status: DC | PRN
  Administered 2016-09-07: 6000 [IU] via INTRAVENOUS

## 2016-09-07 MED ORDER — MORPHINE SULFATE (PF) 2 MG/ML IV SOLN
2.0000 mg | INTRAVENOUS | Status: DC | PRN
  Administered 2016-09-07: 2 mg via INTRAVENOUS
  Filled 2016-09-07: qty 1

## 2016-09-07 MED ORDER — ZOLPIDEM TARTRATE 5 MG PO TABS: 5.0000 mg | ORAL_TABLET | Freq: Every evening | ORAL | Status: DC | PRN

## 2016-09-07 MED ORDER — HEPARIN (PORCINE) IN NACL 2-0.9 UNIT/ML-% IJ SOLN
INTRAMUSCULAR | Status: DC | PRN
  Administered 2016-09-07: 1500 mL

## 2016-09-07 MED ORDER — IOPAMIDOL (ISOVUE-370) INJECTION 76%
INTRAVENOUS | Status: AC
  Filled 2016-09-07: qty 50

## 2016-09-07 MED ORDER — SODIUM CHLORIDE 0.9 % WEIGHT BASED INFUSION: 1.0000 mL/kg/h | INTRAVENOUS | Status: AC

## 2016-09-07 MED ORDER — FERROUS SULFATE 325 (65 FE) MG PO TABS: 325.0000 mg | ORAL_TABLET | Freq: Two times a day (BID) | ORAL | Status: DC

## 2016-09-07 MED ORDER — NITROGLYCERIN 1 MG/10 ML FOR IR/CATH LAB
INTRA_ARTERIAL | Status: DC | PRN
  Administered 2016-09-07: 200 ug via INTRACORONARY

## 2016-09-07 MED ORDER — GABAPENTIN 300 MG PO CAPS
300.0000 mg | ORAL_CAPSULE | Freq: Three times a day (TID) | ORAL | Status: DC
  Administered 2016-09-07 – 2016-09-09 (×6): 300 mg via ORAL
  Filled 2016-09-07 (×6): qty 1

## 2016-09-07 MED ORDER — VERAPAMIL HCL 2.5 MG/ML IV SOLN
INTRAVENOUS | Status: DC | PRN
  Administered 2016-09-07: 10 mL via INTRA_ARTERIAL

## 2016-09-07 MED ORDER — IOPAMIDOL (ISOVUE-370) INJECTION 76%
INTRAVENOUS | Status: AC
  Filled 2016-09-07: qty 100

## 2016-09-07 MED ORDER — ALBUTEROL SULFATE HFA 108 (90 BASE) MCG/ACT IN AERS: 2.0000 | INHALATION_SPRAY | Freq: Four times a day (QID) | RESPIRATORY_TRACT | Status: DC | PRN

## 2016-09-07 MED ORDER — LIDOCAINE HCL (PF) 1 % IJ SOLN
INTRAMUSCULAR | Status: DC | PRN
  Administered 2016-09-07: 2 mL via INTRADERMAL

## 2016-09-07 MED ORDER — ONDANSETRON HCL 4 MG/2ML IJ SOLN: 4.0000 mg | Freq: Four times a day (QID) | INTRAMUSCULAR | Status: DC | PRN

## 2016-09-07 MED ORDER — VERAPAMIL HCL 2.5 MG/ML IV SOLN
INTRAVENOUS | Status: AC
  Filled 2016-09-07: qty 2

## 2016-09-07 MED ORDER — MIDAZOLAM HCL 2 MG/2ML IJ SOLN
INTRAMUSCULAR | Status: AC
  Filled 2016-09-07: qty 2

## 2016-09-07 MED ORDER — INSULIN ASPART 100 UNIT/ML ~~LOC~~ SOLN
0.0000 [IU] | SUBCUTANEOUS | Status: DC
  Administered 2016-09-07 (×4): 8 [IU] via SUBCUTANEOUS
  Administered 2016-09-08: 5 [IU] via SUBCUTANEOUS
  Administered 2016-09-08: 11 [IU] via SUBCUTANEOUS
  Administered 2016-09-08: 8 [IU] via SUBCUTANEOUS
  Administered 2016-09-08: 5 [IU] via SUBCUTANEOUS

## 2016-09-07 MED ORDER — ENOXAPARIN SODIUM 40 MG/0.4ML ~~LOC~~ SOLN
40.0000 mg | SUBCUTANEOUS | Status: DC
  Administered 2016-09-08 – 2016-09-09 (×2): 40 mg via SUBCUTANEOUS
  Filled 2016-09-07 (×2): qty 0.4

## 2016-09-07 MED ORDER — PRAVASTATIN SODIUM 20 MG PO TABS: 20.0000 mg | ORAL_TABLET | Freq: Every day | ORAL | Status: DC

## 2016-09-07 MED ORDER — HEPARIN (PORCINE) IN NACL 100-0.45 UNIT/ML-% IJ SOLN
1200.0000 [IU]/h | INTRAMUSCULAR | Status: DC
  Administered 2016-09-07: 1200 [IU]/h via INTRAVENOUS
  Filled 2016-09-07: qty 250

## 2016-09-07 MED ORDER — FENTANYL CITRATE (PF) 100 MCG/2ML IJ SOLN
INTRAMUSCULAR | Status: DC | PRN
  Administered 2016-09-07 (×2): 50 ug via INTRAVENOUS

## 2016-09-07 MED ORDER — DOXYCYCLINE HYCLATE 100 MG PO TABS: 100.0000 mg | ORAL_TABLET | Freq: Two times a day (BID) | ORAL | Status: DC

## 2016-09-07 MED ORDER — NITROGLYCERIN 1 MG/10 ML FOR IR/CATH LAB
INTRA_ARTERIAL | Status: AC
  Filled 2016-09-07: qty 10

## 2016-09-07 MED ORDER — ALBUTEROL SULFATE (2.5 MG/3ML) 0.083% IN NEBU: 2.5000 mg | INHALATION_SOLUTION | RESPIRATORY_TRACT | Status: DC | PRN

## 2016-09-07 MED ORDER — NITROGLYCERIN IN D5W 200-5 MCG/ML-% IV SOLN
2.0000 ug/min | INTRAVENOUS | Status: DC
  Administered 2016-09-07: 5 ug/min via INTRAVENOUS
  Filled 2016-09-07: qty 250

## 2016-09-07 MED ORDER — SODIUM CHLORIDE 0.45 % IV SOLN
INTRAVENOUS | Status: DC
  Administered 2016-09-07: 04:00:00 via INTRAVENOUS

## 2016-09-07 MED ORDER — BIVALIRUDIN 250 MG IV SOLR
INTRAVENOUS | Status: AC
  Filled 2016-09-07: qty 250

## 2016-09-07 MED ORDER — HYDROMORPHONE HCL 1 MG/ML IJ SOLN
INTRAMUSCULAR | Status: AC
  Filled 2016-09-07: qty 1

## 2016-09-07 MED ORDER — IOPAMIDOL (ISOVUE-370) INJECTION 76%
INTRAVENOUS | Status: DC | PRN
  Administered 2016-09-07: 205 mL via INTRA_ARTERIAL

## 2016-09-07 MED ORDER — TICAGRELOR 90 MG PO TABS
ORAL_TABLET | ORAL | Status: DC | PRN
  Administered 2016-09-07: 180 mg via ORAL

## 2016-09-07 MED ORDER — BIVALIRUDIN BOLUS VIA INFUSION - CUPID
INTRAVENOUS | Status: DC | PRN
  Administered 2016-09-07: 125.1 mg via INTRAVENOUS

## 2016-09-07 MED ORDER — ATORVASTATIN CALCIUM 40 MG PO TABS
80.0000 mg | ORAL_TABLET | ORAL | Status: AC
  Administered 2016-09-07: 80 mg via ORAL
  Filled 2016-09-07: qty 2

## 2016-09-07 MED ORDER — ASPIRIN EC 325 MG PO TBEC: 325.0000 mg | DELAYED_RELEASE_TABLET | Freq: Every day | ORAL | Status: DC

## 2016-09-07 MED ORDER — SODIUM CHLORIDE 0.9 % IV SOLN: 250.0000 mL | INTRAVENOUS | Status: DC | PRN

## 2016-09-07 MED ORDER — ATORVASTATIN CALCIUM 80 MG PO TABS
80.0000 mg | ORAL_TABLET | Freq: Every day | ORAL | Status: DC
  Administered 2016-09-08: 80 mg via ORAL
  Filled 2016-09-07: qty 1

## 2016-09-07 MED ORDER — AMLODIPINE BESYLATE 10 MG PO TABS
10.0000 mg | ORAL_TABLET | Freq: Every day | ORAL | Status: DC
  Administered 2016-09-08 – 2016-09-09 (×2): 10 mg via ORAL
  Filled 2016-09-07 (×2): qty 1

## 2016-09-07 MED ORDER — LIDOCAINE HCL (PF) 1 % IJ SOLN
INTRAMUSCULAR | Status: AC
  Filled 2016-09-07: qty 30

## 2016-09-07 MED ORDER — DEXTROSE 5 % IV SOLN
1.0000 g | Freq: Every day | INTRAVENOUS | Status: DC
  Administered 2016-09-07 – 2016-09-08 (×3): 1 g via INTRAVENOUS
  Filled 2016-09-07 (×5): qty 10

## 2016-09-07 MED ORDER — HYDROMORPHONE HCL 1 MG/ML IJ SOLN
0.5000 mg | Freq: Once | INTRAMUSCULAR | Status: AC
  Administered 2016-09-07: 0.5 mg via INTRAVENOUS

## 2016-09-07 MED ORDER — ASPIRIN 81 MG PO CHEW
81.0000 mg | CHEWABLE_TABLET | Freq: Every day | ORAL | Status: DC
  Administered 2016-09-07 – 2016-09-09 (×3): 81 mg via ORAL
  Filled 2016-09-07 (×3): qty 1

## 2016-09-07 MED ORDER — TICAGRELOR 90 MG PO TABS
ORAL_TABLET | ORAL | Status: AC
  Filled 2016-09-07: qty 1

## 2016-09-07 MED ORDER — METOPROLOL TARTRATE 25 MG PO TABS
25.0000 mg | ORAL_TABLET | Freq: Two times a day (BID) | ORAL | Status: DC
  Administered 2016-09-07 – 2016-09-09 (×5): 25 mg via ORAL
  Filled 2016-09-07 (×5): qty 1

## 2016-09-07 MED ORDER — ATORVASTATIN CALCIUM 80 MG PO TABS: 80.0000 mg | ORAL_TABLET | Freq: Every day | ORAL | Status: DC

## 2016-09-07 MED ORDER — SODIUM CHLORIDE 0.9 % IV SOLN
INTRAVENOUS | Status: DC
  Administered 2016-09-07: 08:00:00 via INTRAVENOUS

## 2016-09-07 MED ORDER — ASPIRIN 81 MG PO CHEW: 81.0000 mg | CHEWABLE_TABLET | ORAL | Status: DC

## 2016-09-07 MED FILL — Ondansetron HCl Inj 4 MG/2ML (2 MG/ML): INTRAMUSCULAR | Qty: 2 | Status: AC

## 2016-09-07 SURGICAL SUPPLY — 21 items
BALLN EMERGE MR 2.5X12 (BALLOONS) ×2
BALLN ~~LOC~~ EMERGE MR 3.25X8 (BALLOONS) ×2
BALLN ~~LOC~~ EUPHORA RX 3.5X8 (BALLOONS) ×2
BALLOON EMERGE MR 2.5X12 (BALLOONS) ×1 IMPLANT
BALLOON ~~LOC~~ EMERGE MR 3.25X8 (BALLOONS) ×1 IMPLANT
BALLOON ~~LOC~~ EUPHORA RX 3.5X8 (BALLOONS) ×1 IMPLANT
CATH INFINITI JR4 5F (CATHETERS) ×2 IMPLANT
CATH VISTA GUIDE 6FR XBLAD3.0 (CATHETERS) ×2 IMPLANT
DEVICE RAD COMP TR BAND LRG (VASCULAR PRODUCTS) ×2 IMPLANT
ELECT DEFIB PAD ADLT CADENCE (PAD) ×2 IMPLANT
GLIDESHEATH SLEND A-KIT 6F 22G (SHEATH) ×2 IMPLANT
HOVERMATT SINGLE USE (MISCELLANEOUS) ×2 IMPLANT
KIT ENCORE 26 ADVANTAGE (KITS) ×2 IMPLANT
KIT HEART LEFT (KITS) ×2 IMPLANT
PACK CARDIAC CATHETERIZATION (CUSTOM PROCEDURE TRAY) ×2 IMPLANT
STENT XIENCE ALPINE RX 3.0X18 (Permanent Stent) ×2 IMPLANT
TRANSDUCER W/STOPCOCK (MISCELLANEOUS) ×2 IMPLANT
TUBING CIL FLEX 10 FLL-RA (TUBING) ×2 IMPLANT
WIRE ASAHI PROWATER 180CM (WIRE) ×2 IMPLANT
WIRE HI TORQ BMW 190CM (WIRE) ×2 IMPLANT
WIRE SAFE-T 1.5MM-J .035X260CM (WIRE) ×2 IMPLANT

## 2016-09-07 NOTE — Progress Notes (Signed)
ANTICOAGULATION CONSULT NOTE - Initial Consult  Pharmacy Consult for Heparin Indication: chest pain/ACS  Allergies  Allergen Reactions  . Hydrazine Yellow [Tartrazine] Shortness Of Breath and Swelling    Swelling mostly noticed in legs and feet, retaining urination, shortness of breaht, and minor chest pain  . Lisinopril Shortness Of Breath    Was on prinzide; had sob/chest pain on it.  . Tylenol [Acetaminophen] Itching and Swelling    Itching of the mouth, swelling of tongue and stomach started hurting    Patient Measurements: Height: 5\' 8"  (172.7 cm) Weight: (!) 367 lb 11.6 oz (166.8 kg) IBW/kg (Calculated) : 63.9 Heparin Dosing Weight:   Vital Signs: Temp: 99.2 F (37.3 C) (09/17 1548) Temp Source: Oral (09/17 1548) BP: 148/100 (09/18 0245) Pulse Rate: 86 (09/18 0245)  Labs:  Recent Labs  09/06/16 1620 09/06/16 2217  HGB 11.8*  --   HCT 36.3  --   PLT 208  --   CREATININE 0.77  --   TROPONINI  --  1.65*    Estimated Creatinine Clearance: 164.4 mL/min (by C-G formula based on SCr of 0.77 mg/dL).   Medical History: Past Medical History:  Diagnosis Date  . ARF (acute renal failure) ([facility]) 04/2015  . Asthma   . Cellulitis of right upper extremity   . Diabetes mellitus    insulin dependent  . Hypertension   . Obesity     Medications:  Infusions:  . sodium chloride    . heparin    . nitroGLYCERIN 5 mcg/min (09/07/16 0246)    Assessment: Patient with chest pain and now + CE.   Baseline coags ordered.  No oral anticoagulants noted on med rec.     Goal of Therapy:  Heparin level 0.3-0.7 units/ml Monitor platelets by anticoagulation protocol: Yes   Plan:  Heparin bolus 4000  units iv x1 Heparin drip at 1200 units/hr Daily CBC Next heparin level at [address] [place] 09/07/2016,3:32 AM

## 2016-09-07 NOTE — Progress Notes (Signed)
Dr. Donnie Aho paged about novolog orders pre-cath. No answer before [facility] arrived, cath lab notified.

## 2016-09-07 NOTE — ED Notes (Signed)
Informed PA that results from CT are in.

## 2016-09-07 NOTE — H&P (Signed)
History and Physical    Lindsay [name] MRN:[id number] DOB: 05/27/1981 DOA: 09/06/2016  Referring MD/NP/PA: EDP PCP: Dawn T Langeland, MD  Outpatient Specialists: N/A Patient coming from: Home  Chief Complaint: Chest pains.  HPI: Lindsay [name] is a 34 y.o. female with medical history significant for but not limited to DM2, bronchial Asthma and Morbid Obesity presenting with chest pains of an hours duration.  Chest pains described as sharp, severe 10/10 intially, lasting for several hours, started after playing with her kids, radiating across her chest but not to her neck, or upper extremity or back, associated with shortness of breadth, non-productive cough and wheezing without fever or chills. She had also experienced nausea without vomiting or diaphoresis but associated with dizziness without vertiginous signature.  She was started on lisinopril but developed shortness of breath and chest pain that was sharp and the lisinopril component was discontinued. She had also started Depo shots this month.She has had some increasing lower extremity edema of late.  She activated EMS after taking 4 baby ASA without improvement. She was brought to [facility] ED still in pains.        ED Course: At the ED patient was treated with morphine for pains and her EKG was unremarkable but her troponin started to trend up. Dr Tilley of Cardiology was consulted and he recommended Hospitalist admit for further management with plans for cardiac cath in the morning. CT Angio of Chest/Abd?pelvis done to rule out Dissection revealed nodular clusters felt to be possibly indicative of early bronchopneumonia.  Cardiology wants patient transferred to [facility] but there's no tele or step down bed available at this time.  Review of Systems: As per HPI otherwise 10 point review of systems negative  Past Medical History:  Diagnosis Date  . ARF (acute renal failure) (HCC) 04/2015  . Asthma   . Cellulitis of right upper  extremity   . Diabetes mellitus    insulin dependent  . Hypertension   . Obesity     Past Surgical History:  Procedure Laterality Date  . CESAREAN SECTION    . IRRIGATION AND DEBRIDEMENT SHOULDER Right 04/30/2015   Procedure: IRRIGATION AND DEBRIDEMENT SHOULDER;  Surgeon: Timothy D Murphy, MD;  Location: [facility] OR;  Service: Orthopedics;  Laterality: Right;  . IRRIGATION AND DEBRIDEMENT SHOULDER Right 05/01/2015  . LEG SURGERY    . SHOULDER ARTHROSCOPY Right 04/30/2015   Procedure: ARTHROSCOPY SHOULDER;  Surgeon: Timothy D Murphy, MD;  Location: [facility] OR;  Service: Orthopedics;  Laterality: Right;  . TONSILLECTOMY       reports that she has never smoked. She has never used smokeless tobacco. She reports that she drinks alcohol. She reports that she does not use drugs.  Allergies  Allergen Reactions  . Hydrazine Yellow [Tartrazine] Shortness Of Breath and Swelling    Swelling mostly noticed in legs and feet, retaining urination, shortness of breaht, and minor chest pain  . Lisinopril Shortness Of Breath    Was on prinzide; had sob/chest pain on it.  . Tylenol [Acetaminophen] Itching and Swelling    Itching of the mouth, swelling of tongue and stomach started hurting    Family History  Problem Relation Age of Onset  . Diabetes Mother   . Hypertension Mother   . Heart disease Mother   . Diabetes Father   . Heart disease Father   . Stroke Maternal Grandmother   . Cancer Maternal Grandmother      Prior to Admission medications   Medication Sig Start   History and Physical    Lindsay [name] MRN:[id number] DOB: 05/27/1981 DOA: 09/06/2016  Referring MD/NP/PA: EDP PCP: Dawn T Langeland, MD  Outpatient Specialists: N/A Patient coming from: Home  Chief Complaint: Chest pains.  HPI: Lindsay [name] is a 34 y.o. female with medical history significant for but not limited to DM2, bronchial Asthma and Morbid Obesity presenting with chest pains of an hours duration.  Chest pains described as sharp, severe 10/10 intially, lasting for several hours, started after playing with her kids, radiating across her chest but not to her neck, or upper extremity or back, associated with shortness of breadth, non-productive cough and wheezing without fever or chills. She had also experienced nausea without vomiting or diaphoresis but associated with dizziness without vertiginous signature.  She was started on lisinopril but developed shortness of breath and chest pain that was sharp and the lisinopril component was discontinued. She had also started Depo shots this month.She has had some increasing lower extremity edema of late.  She activated EMS after taking 4 baby ASA without improvement. She was brought to [facility] ED still in pains.        ED Course: At the ED patient was treated with morphine for pains and her EKG was unremarkable but her troponin started to trend up. Dr Tilley of Cardiology was consulted and he recommended Hospitalist admit for further management with plans for cardiac cath in the morning. CT Angio of Chest/Abd?pelvis done to rule out Dissection revealed nodular clusters felt to be possibly indicative of early bronchopneumonia.  Cardiology wants patient transferred to [facility] but there's no tele or step down bed available at this time.  Review of Systems: As per HPI otherwise 10 point review of systems negative  Past Medical History:  Diagnosis Date  . ARF (acute renal failure) (HCC) 04/2015  . Asthma   . Cellulitis of right upper  extremity   . Diabetes mellitus    insulin dependent  . Hypertension   . Obesity     Past Surgical History:  Procedure Laterality Date  . CESAREAN SECTION    . IRRIGATION AND DEBRIDEMENT SHOULDER Right 04/30/2015   Procedure: IRRIGATION AND DEBRIDEMENT SHOULDER;  Surgeon: Timothy D Murphy, MD;  Location: [facility] OR;  Service: Orthopedics;  Laterality: Right;  . IRRIGATION AND DEBRIDEMENT SHOULDER Right 05/01/2015  . LEG SURGERY    . SHOULDER ARTHROSCOPY Right 04/30/2015   Procedure: ARTHROSCOPY SHOULDER;  Surgeon: Timothy D Murphy, MD;  Location: [facility] OR;  Service: Orthopedics;  Laterality: Right;  . TONSILLECTOMY       reports that she has never smoked. She has never used smokeless tobacco. She reports that she drinks alcohol. She reports that she does not use drugs.  Allergies  Allergen Reactions  . Hydrazine Yellow [Tartrazine] Shortness Of Breath and Swelling    Swelling mostly noticed in legs and feet, retaining urination, shortness of breaht, and minor chest pain  . Lisinopril Shortness Of Breath    Was on prinzide; had sob/chest pain on it.  . Tylenol [Acetaminophen] Itching and Swelling    Itching of the mouth, swelling of tongue and stomach started hurting    Family History  Problem Relation Age of Onset  . Diabetes Mother   . Hypertension Mother   . Heart disease Mother   . Diabetes Father   . Heart disease Father   . Stroke Maternal Grandmother   . Cancer Maternal Grandmother      Prior to Admission medications   Medication Sig Start   History and Physical    Lindsay [name] MRN:[id number] DOB: 05/27/1981 DOA: 09/06/2016  Referring MD/NP/PA: EDP PCP: Dawn T Langeland, MD  Outpatient Specialists: N/A Patient coming from: Home  Chief Complaint: Chest pains.  HPI: Lindsay [name] is a 34 y.o. female with medical history significant for but not limited to DM2, bronchial Asthma and Morbid Obesity presenting with chest pains of an hours duration.  Chest pains described as sharp, severe 10/10 intially, lasting for several hours, started after playing with her kids, radiating across her chest but not to her neck, or upper extremity or back, associated with shortness of breadth, non-productive cough and wheezing without fever or chills. She had also experienced nausea without vomiting or diaphoresis but associated with dizziness without vertiginous signature.  She was started on lisinopril but developed shortness of breath and chest pain that was sharp and the lisinopril component was discontinued. She had also started Depo shots this month.She has had some increasing lower extremity edema of late.  She activated EMS after taking 4 baby ASA without improvement. She was brought to [facility] ED still in pains.        ED Course: At the ED patient was treated with morphine for pains and her EKG was unremarkable but her troponin started to trend up. Dr Tilley of Cardiology was consulted and he recommended Hospitalist admit for further management with plans for cardiac cath in the morning. CT Angio of Chest/Abd?pelvis done to rule out Dissection revealed nodular clusters felt to be possibly indicative of early bronchopneumonia.  Cardiology wants patient transferred to [facility] but there's no tele or step down bed available at this time.  Review of Systems: As per HPI otherwise 10 point review of systems negative  Past Medical History:  Diagnosis Date  . ARF (acute renal failure) (HCC) 04/2015  . Asthma   . Cellulitis of right upper  extremity   . Diabetes mellitus    insulin dependent  . Hypertension   . Obesity     Past Surgical History:  Procedure Laterality Date  . CESAREAN SECTION    . IRRIGATION AND DEBRIDEMENT SHOULDER Right 04/30/2015   Procedure: IRRIGATION AND DEBRIDEMENT SHOULDER;  Surgeon: Timothy D Murphy, MD;  Location: [facility] OR;  Service: Orthopedics;  Laterality: Right;  . IRRIGATION AND DEBRIDEMENT SHOULDER Right 05/01/2015  . LEG SURGERY    . SHOULDER ARTHROSCOPY Right 04/30/2015   Procedure: ARTHROSCOPY SHOULDER;  Surgeon: Timothy D Murphy, MD;  Location: [facility] OR;  Service: Orthopedics;  Laterality: Right;  . TONSILLECTOMY       reports that she has never smoked. She has never used smokeless tobacco. She reports that she drinks alcohol. She reports that she does not use drugs.  Allergies  Allergen Reactions  . Hydrazine Yellow [Tartrazine] Shortness Of Breath and Swelling    Swelling mostly noticed in legs and feet, retaining urination, shortness of breaht, and minor chest pain  . Lisinopril Shortness Of Breath    Was on prinzide; had sob/chest pain on it.  . Tylenol [Acetaminophen] Itching and Swelling    Itching of the mouth, swelling of tongue and stomach started hurting    Family History  Problem Relation Age of Onset  . Diabetes Mother   . Hypertension Mother   . Heart disease Mother   . Diabetes Father   . Heart disease Father   . Stroke Maternal Grandmother   . Cancer Maternal Grandmother      Prior to Admission medications   Medication Sig Start   History and Physical    Lindsay [name] MRN:[id number] DOB: 05/27/1981 DOA: 09/06/2016  Referring MD/NP/PA: EDP PCP: Dawn T Langeland, MD  Outpatient Specialists: N/A Patient coming from: Home  Chief Complaint: Chest pains.  HPI: Lindsay [name] is a 34 y.o. female with medical history significant for but not limited to DM2, bronchial Asthma and Morbid Obesity presenting with chest pains of an hours duration.  Chest pains described as sharp, severe 10/10 intially, lasting for several hours, started after playing with her kids, radiating across her chest but not to her neck, or upper extremity or back, associated with shortness of breadth, non-productive cough and wheezing without fever or chills. She had also experienced nausea without vomiting or diaphoresis but associated with dizziness without vertiginous signature.  She was started on lisinopril but developed shortness of breath and chest pain that was sharp and the lisinopril component was discontinued. She had also started Depo shots this month.She has had some increasing lower extremity edema of late.  She activated EMS after taking 4 baby ASA without improvement. She was brought to [facility] ED still in pains.        ED Course: At the ED patient was treated with morphine for pains and her EKG was unremarkable but her troponin started to trend up. Dr Tilley of Cardiology was consulted and he recommended Hospitalist admit for further management with plans for cardiac cath in the morning. CT Angio of Chest/Abd?pelvis done to rule out Dissection revealed nodular clusters felt to be possibly indicative of early bronchopneumonia.  Cardiology wants patient transferred to [facility] but there's no tele or step down bed available at this time.  Review of Systems: As per HPI otherwise 10 point review of systems negative  Past Medical History:  Diagnosis Date  . ARF (acute renal failure) (HCC) 04/2015  . Asthma   . Cellulitis of right upper  extremity   . Diabetes mellitus    insulin dependent  . Hypertension   . Obesity     Past Surgical History:  Procedure Laterality Date  . CESAREAN SECTION    . IRRIGATION AND DEBRIDEMENT SHOULDER Right 04/30/2015   Procedure: IRRIGATION AND DEBRIDEMENT SHOULDER;  Surgeon: Timothy D Murphy, MD;  Location: [facility] OR;  Service: Orthopedics;  Laterality: Right;  . IRRIGATION AND DEBRIDEMENT SHOULDER Right 05/01/2015  . LEG SURGERY    . SHOULDER ARTHROSCOPY Right 04/30/2015   Procedure: ARTHROSCOPY SHOULDER;  Surgeon: Timothy D Murphy, MD;  Location: [facility] OR;  Service: Orthopedics;  Laterality: Right;  . TONSILLECTOMY       reports that she has never smoked. She has never used smokeless tobacco. She reports that she drinks alcohol. She reports that she does not use drugs.  Allergies  Allergen Reactions  . Hydrazine Yellow [Tartrazine] Shortness Of Breath and Swelling    Swelling mostly noticed in legs and feet, retaining urination, shortness of breaht, and minor chest pain  . Lisinopril Shortness Of Breath    Was on prinzide; had sob/chest pain on it.  . Tylenol [Acetaminophen] Itching and Swelling    Itching of the mouth, swelling of tongue and stomach started hurting    Family History  Problem Relation Age of Onset  . Diabetes Mother   . Hypertension Mother   . Heart disease Mother   . Diabetes Father   . Heart disease Father   . Stroke Maternal Grandmother   . Cancer Maternal Grandmother      Prior to Admission medications   Medication Sig Start

## 2016-09-07 NOTE — ED Notes (Signed)
Report called to Junior, [facility].  Will start nitro and transport patient to floor.

## 2016-09-07 NOTE — Progress Notes (Signed)
Patients grandmother Briscoe Deutscher) called and updated regarding transfer to [facility]. Bilateral groins clipped and cleaned. Identification armband moved to left wrist.

## 2016-09-07 NOTE — Progress Notes (Signed)
Pt has medicaid. Her copay for brilinta will be around 3.00 per month. Did give pt 30day free brilinta card.

## 2016-09-07 NOTE — CV Procedure (Signed)
   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  Mild to moderate proximal/ostial circumflex disease.  Successful PTCA with stenting of the mid LAD from 100% to 0% with TIMI grade 3 flow.  Persistent/unchanged chest pain postprocedure

## 2016-09-07 NOTE — Interval H&P Note (Signed)
Cath Lab Visit (complete for each Cath Lab visit)  Clinical Evaluation Leading to the Procedure:   ACS: Yes.    Non-ACS:    Anginal Classification: CCS III  Anti-ischemic medical therapy: Minimal Therapy (1 class of medications)  Non-Invasive Test Results: No non-invasive testing performed  Prior CABG: No previous CABG      History and Physical Interval Note:  09/07/2016 8:25 AM  Lindsay [name]  has presented today for surgery, with the diagnosis of unstable angina  The various methods of treatment have been discussed with the patient and family. After consideration of risks, benefits and other options for treatment, the patient has consented to  Procedure(s): Left Heart Cath and Coronary Angiography (N/A) as a surgical intervention .  The patient's history has been reviewed, patient examined, no change in status, stable for surgery.  I have reviewed the patient's chart and labs.  Questions were answered to the patient's satisfaction.     Lyn Records III

## 2016-09-07 NOTE — Progress Notes (Signed)
Patient complains for 6/10 chest pain mid chest. 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  2L [place] applied, and 2mg  of morphine given. Will continue to monitor patient's pain level.

## 2016-09-08 ENCOUNTER — Inpatient Hospital Stay (HOSPITAL_COMMUNITY): Payer: Medicaid Other

## 2016-09-08 DIAGNOSIS — E0800 Diabetes mellitus due to underlying condition with hyperosmolarity without nonketotic hyperglycemic-hyperosmolar coma (NKHHC): Secondary | ICD-10-CM

## 2016-09-08 DIAGNOSIS — R079 Chest pain, unspecified: Secondary | ICD-10-CM

## 2016-09-08 DIAGNOSIS — E118 Type 2 diabetes mellitus with unspecified complications: Secondary | ICD-10-CM

## 2016-09-08 DIAGNOSIS — I214 Non-ST elevation (NSTEMI) myocardial infarction: Principal | ICD-10-CM

## 2016-09-08 LAB — HEMOGLOBIN A1C
Hgb A1c MFr Bld: 12.4 % — ABNORMAL HIGH (ref 4.8–5.6)
MEAN PLASMA GLUCOSE: 309 mg/dL

## 2016-09-08 LAB — GLUCOSE, CAPILLARY
GLUCOSE-CAPILLARY: 248 mg/dL — AB (ref 65–99)
GLUCOSE-CAPILLARY: 211 mg/dL — AB (ref 65–99)
GLUCOSE-CAPILLARY: 306 mg/dL — AB (ref 65–99)
GLUCOSE-CAPILLARY: 280 mg/dL — AB (ref 65–99)
Glucose-Capillary: 294 mg/dL — ABNORMAL HIGH (ref 65–99)
Glucose-Capillary: 305 mg/dL — ABNORMAL HIGH (ref 65–99)
Glucose-Capillary: 352 mg/dL — ABNORMAL HIGH (ref 65–99)

## 2016-09-08 LAB — BASIC METABOLIC PANEL
Anion gap: 9 (ref 5–15)
BUN: 11 mg/dL (ref 6–20)
CHLORIDE: 103 mmol/L (ref 101–111)
CO2: 22 mmol/L (ref 22–32)
Calcium: 8.6 mg/dL — ABNORMAL LOW (ref 8.9–10.3)
Creatinine, Ser: 0.83 mg/dL (ref 0.44–1.00)
GFR calc Af Amer: >60 mL/min (ref >60)
GLUCOSE: 284 mg/dL — AB (ref 65–99)
POTASSIUM: 4 mmol/L (ref 3.5–5.1)
Sodium: 134 mmol/L — ABNORMAL LOW (ref 135–145)

## 2016-09-08 LAB — CBC
HEMATOCRIT: 36.1 % (ref 36.0–46.0)
Hemoglobin: 11.4 g/dL — ABNORMAL LOW (ref 12.0–15.0)
MCH: 25.8 pg — AB (ref 26.0–34.0)
MCHC: 31.6 g/dL (ref 30.0–36.0)
MCV: 81.7 fL (ref 78.0–100.0)
PLATELETS: 216 10*3/uL (ref 150–400)
RBC: 4.42 MIL/uL (ref 3.87–5.11)
RDW: 13.9 % (ref 11.5–15.5)
WBC: 8.2 10*3/uL (ref 4.0–10.5)

## 2016-09-08 LAB — TROPONIN I: Troponin I: 6.64 ng/mL (ref <0.03)

## 2016-09-08 LAB — ECHOCARDIOGRAM COMPLETE
HEIGHTINCHES: 68 in
Weight: 5883.64 oz

## 2016-09-08 MED ORDER — INSULIN ASPART 100 UNIT/ML ~~LOC~~ SOLN
0.0000 [IU] | Freq: Three times a day (TID) | SUBCUTANEOUS | Status: DC
  Administered 2016-09-08: 15 [IU] via SUBCUTANEOUS
  Administered 2016-09-09 (×2): 7 [IU] via SUBCUTANEOUS

## 2016-09-08 MED ORDER — LOSARTAN POTASSIUM 25 MG PO TABS
25.0000 mg | ORAL_TABLET | Freq: Every day | ORAL | Status: DC
  Administered 2016-09-08 – 2016-09-09 (×2): 25 mg via ORAL
  Filled 2016-09-08 (×2): qty 1

## 2016-09-08 MED ORDER — PERFLUTREN LIPID MICROSPHERE
INTRAVENOUS | Status: AC
Start: 2016-09-08 — End: 2016-09-08
  Administered 2016-09-08: 2 mL
  Filled 2016-09-08: qty 10

## 2016-09-08 MED ORDER — TRAMADOL HCL 50 MG PO TABS
50.0000 mg | ORAL_TABLET | Freq: Once | ORAL | Status: AC
  Administered 2016-09-08: 50 mg via ORAL
  Filled 2016-09-08: qty 1

## 2016-09-08 MED ORDER — PERFLUTREN LIPID MICROSPHERE
1.0000 mL | INTRAVENOUS | Status: AC | PRN
  Filled 2016-09-08: qty 10

## 2016-09-08 MED ORDER — INSULIN GLARGINE 100 UNIT/ML ~~LOC~~ SOLN
40.0000 [IU] | Freq: Two times a day (BID) | SUBCUTANEOUS | Status: DC
  Administered 2016-09-08 – 2016-09-09 (×3): 40 [IU] via SUBCUTANEOUS
  Filled 2016-09-08 (×5): qty 0.4

## 2016-09-08 NOTE — Progress Notes (Signed)
Inpatient Diabetes Program Recommendations  AACE/ADA: New Consensus Statement on Inpatient Glycemic Control (2015)  Target Ranges:  Prepandial:   less than 140 mg/dL      Peak postprandial:   less than 180 mg/dL (1-2 hours)      Critically ill patients:  140 - 180 mg/dL   Lab Results  Component Value Date   GLUCAP 211 (H) 09/08/2016   HGBA1C 13.1 06/10/2016    Review of Glycemic Control Results for Lindsay [name], Lindsay [name] (MRN [id number]) as of 09/08/2016 10:54  Ref. Range 09/07/2016 16:19 09/07/2016 21:40 09/08/2016 00:31 09/08/2016 04:25 09/08/2016 07:43  Glucose-Capillary Latest Ref Range: 65 - 99 mg/dL 790 (H) 240 (H) 973 (H) 248 (H) 211 (H)   Diabetes history: DM2 Outpatient Diabetes medications: Lantus 65 units + Novolog 20 units tid mc+ Metformin 1 gm bid Current orders for Inpatient glycemic control: Novolog correction 0-15 q 4 hrs.  Inpatient Diabetes Program Recommendations:   Noted patient is now eating.  Please consider Lantus 40 units daily + Novolog meal coverage 5 units tid if eats 50% +Novolog correction changed to tid with meals+ 0-5 units hs. Noted A1c pending.  Thank you, Billy Fischer. Shalese Strahan, RN, MSN, CDE Inpatient Glycemic Control Team Team Pager [phone] (8am-5pm) 09/08/2016 10:57 AM

## 2016-09-08 NOTE — Progress Notes (Addendum)
Patient Name: Lindsay [name] Date of Encounter: 09/08/2016  Primary Cardiologist: Mendel RyderH Smith, MD  Hospital Problem List     Active Problems:   Diabetes mellitus due to underlying condition without complications (HCC)   Pain in the chest   Community acquired pneumonia   Elevated troponin   NSTEMI (non-ST elevated myocardial infarction) (HCC)     Subjective   Feels much better. No CP other than with deep breath. No SOB. Cath site OK.   Inpatient Medications    Scheduled Meds: . amLODipine  10 mg Oral Daily  . aspirin  81 mg Oral Daily  . atorvastatin  80 mg Oral q1800  . azithromycin  500 mg Intravenous Q0600  . cefTRIAXone (ROCEPHIN)  IV  1 g Intravenous QHS  . enoxaparin (LOVENOX) injection  40 mg Subcutaneous Q24H  . gabapentin  300 mg Oral TID  . insulin aspart  0-15 Units Subcutaneous Q4H  . metoprolol tartrate  25 mg Oral BID  . sodium chloride flush  3 mL Intravenous Q12H  . ticagrelor  90 mg Oral BID   Continuous Infusions:  PRN Meds:.sodium chloride, acetaminophen, morphine injection, ondansetron (ZOFRAN) IV, oxyCODONE-acetaminophen, sodium chloride flush   Vital Signs    Vitals:   09/08/16 0400 09/08/16 0600 09/08/16 0744 09/08/16 0800  BP: 117/79 119/88 124/88 131/89  Pulse: 87 85 89 85  Resp: (!) 21 19 20  (!) 24  Temp: 98.6 F (37 C)  98.3 F (36.8 C)   TempSrc: Oral  Oral   SpO2: 100% 100% 100% 100%  Weight:      Height:        Intake/Output Summary (Last 24 hours) at 09/08/16 1007 Last data filed at 09/08/16 0943  Gross per 24 hour  Intake              860 ml  Output             2400 ml  Net            -1540 ml   Filed Weights   09/07/16 0327  Weight: (!) 367 lb 11.6 oz (166.8 kg)    Physical Exam    GEN: Well nourished, well developed, in no acute distress.  HEENT: Grossly normal.  Neck: Supple, no JVD, carotid bruits, or masses. Cardiac: RRR, no murmurs, rubs, or gallops. No clubbing, cyanosis, edema.  Radials/DP/PT 2+ and equal  bilaterally.  Respiratory:  Respirations regular and unlabored, clear to auscultation bilaterally. GI: Soft, nontender, nondistended, BS + x 4.obese MS: no deformity or atrophy. Skin: warm and dry, no rash. Neuro:  Strength and sensation are intact. Psych: AAOx3.  Normal affect.  Labs    CBC  Recent Labs  09/07/16 1213 09/08/16 0234  WBC 7.6 8.2  HGB 11.2* 11.4*  HCT 34.7* 36.1  MCV 80.3 81.7  PLT 208 216   Basic Metabolic Panel  Recent Labs  09/06/16 1620 09/07/16 1213 09/08/16 0234  NA 135  --  134*  K 4.3  --  4.0  CL 103  --  103  CO2 25  --  22  GLUCOSE 401*  --  284*  BUN 14  --  11  CREATININE 0.77 0.71 0.83  CALCIUM 8.9  --  8.6*   Liver Function Tests No results for input(s): AST, ALT, ALKPHOS, BILITOT, PROT, ALBUMIN in the last 72 hours. No results for input(s): LIPASE, AMYLASE in the last 72 hours. Cardiac Enzymes  Recent Labs  09/06/16 2217 09/07/16 0351  09/08/16 0234  TROPONINI 1.65* 4.74* 6.64*   BNP Invalid input(s): POCBNP D-Dimer  Recent Labs  09/06/16 1815  DDIMER <0.27   Hemoglobin A1C No results for input(s): HGBA1C in the last 72 hours. Fasting Lipid Panel  Recent Labs  09/07/16 0405  CHOL 201*  HDL 40*  LDLCALC 117*  TRIG 219*  CHOLHDL 5.0   Thyroid Function Tests No results for input(s): TSH, T4TOTAL, T3FREE, THYROIDAB in the last 72 hours.  Invalid input(s): FREET3  Telemetry    No VT  - Personally Reviewed  ECG    Mild ST elevation V2-3, NSR- Personally Reviewed  Radiology    Dg Chest 2 View  Result Date: 09/06/2016 CLINICAL DATA:  Acute chest pain and shortness of breath today. EXAM: CHEST  2 VIEW COMPARISON:  12/14/2014 and 06/10/2011 radiographs FINDINGS: The cardiomediastinal silhouette is unremarkable. Mild peribronchial thickening has slightly increased. There is no evidence of focal airspace disease, pulmonary edema, suspicious pulmonary nodule/mass, pleural effusion, or pneumothorax. No acute  bony abnormalities are identified. IMPRESSION: Slightly increasing mild peribronchial thickening. No evidence of focal airspace disease. Electronically Signed   By: Harmon Pier M.D.   On: 09/06/2016 17:35   Ct Angio Chest/abd/pel For Dissection W And/or W/wo  Result Date: 09/07/2016 CLINICAL DATA:  35 y/o F; sharp central chest pain with dizziness and shortness of breath. EXAM: CT ANGIOGRAPHY CHEST, ABDOMEN AND PELVIS TECHNIQUE: Multidetector CT imaging through the chest, abdomen and pelvis was performed using the standard protocol during bolus administration of intravenous contrast. Multiplanar reconstructed images and MIPs were obtained and reviewed to evaluate the vascular anatomy. CONTRAST:  200 cc Isovue 370.  A COMPARISON:  None. FINDINGS: CTA CHEST FINDINGS Normal heart size. No pericardial effusion. Mild coronary artery calcifications. Normal size main pulmonary artery. Normal caliber of thoracic aorta. No evidence of dissection, penetrating ulcer, or intramural hematoma of the thoracic aorta. No mediastinal lymphadenopathy. There are small clusters of nodules within the apical and basal posterior segments of the left lower lobe compatible with early bronchopneumonia. Lungs are otherwise clear. No pleural effusion or pneumothorax. No acute osseous abnormality or suspicious bone lesion is identified. Review of the MIP images confirms the above findings. CTA ABDOMEN AND PELVIS FINDINGS Nonspecific calcifications within the spleen and single calcification within left lobe of liver likely represents sequelae of prior granulomatous disease. The liver is otherwise unremarkable. Normal gallbladder. No biliary ductal dilatation. No appreciable mass or inflammatory changes of the pancreas. Borderline splenomegaly.  Calcified granulomas of spleen. Normal adrenal glands. No focal lesion or inflammatory changes of the kidneys. No kidney stone or obstructive uropathy. The stomach is unremarkable. No obstructive or  inflammatory changes of the bowel. Normal appendix. No lymphadenopathy. Normal caliber thoracic abdominal aorta. No evidence of otherwise unremarkable CT of the chest abdomen and pelvis for age. Dissection, penetrating ulcer, aneurysm, or intramural hematoma. Good opacification of the portal venous system. No suspicious osseous lesion or acute osseous abnormality. Uterus and adnexa are unremarkable. Review of the MIP images confirms the above findings. IMPRESSION: 1. No aneurysm or dissection of the aorta is identified. No acute vascular abnormality. 2. Clusters of nodules in the left lower lobe are probably represent early bronchopneumonia. These results were called by telephone at the time of interpretation on 09/07/2016 at 1:01 am to [facility], who verbally acknowledged these results. Electronically Signed   By: Mitzi Hansen M.D.   On: 09/07/2016 01:01     Cardiac Studies   Cath: 09/07/16  The left ventricular  ejection fraction is 35-45% by visual estimate.  LV end diastolic pressure is moderately elevated.  There is no mitral valve regurgitation.    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 This is a culprit for the patient's presentation and elevated troponins.  Successful PCI and stent implantation reducing the 100% stenosis to 0% with TIMI grade 3 flow. Final postdilatation diameter 3.5 mm. Xience Alpine 3.0 x 18 mm DES was used.  Successful PTCA of second diagonal ostium from 75% to 50% pre-stenting.  25-40% stenosis in the proximal circumflex, and 50% segmental stenosis in the proximal first obtuse marginal.  Anteroapical akinesis. LVEDP 26 mmHg. Estimated EF 35-45%.  Recommendations:   Aggressive risk factor modification including high-intensity statin therapy, aggressive glycemic control, weight reduction, blood pressure control, low-fat carbohydrate modified diet, and exercise.  Eligible for  discharge in 24 hours assuming no complications.  IV heparin was discontinued.  Aspirin and Brilinta 6-12 months.  Beta blocker therapy and ACE/ARB to treat what I believe will be reversible LV systolic dysfunction.    Patient Profile     35 year old with NSTEMI - LAD PCI and diagonal PTCA only. EF 35-45%  Assessment & Plan    NSTEMI  - Trop 6.6  - ASA, Brilinta (medicaid)  - statin atorva 80. High intensity  - metoprolol.  - amlodipine and start losartan 25mg  (EF 35%)  - will see how she tolerates ARB (prior ACE felt SOB and CP-may have been CAD)  - breathing improved, no lasix.   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   Possible CAP  - ceftriaxone  - consult TRH for this and DM. Appreciate collaborative care  Transfer to floor.   Signed, Donato Schultz, MD  09/08/2016, 10:07 AM

## 2016-09-08 NOTE — Consult Note (Signed)
Medical Consultation   Lindsay [name]  UVO:[id number]  DOB: 11/27/1981  DOA: 09/06/2016  PCP: Pete Glatter, MD   Requesting physician: Cardiology -Donato Schultz, MD  Reason for consultation: Management of uncontrolled diabetes and CAP. For probable discharge tomorrow.    History of Present Illness: Lindsay [name] is an 35 y.o. female with morbid obesity, DM2, and asthma. Patient presented to [facility] ED two nights ago with chest pain, shortness or breath, cough and wheezing.  CTA of the chest compatible with early bronchopneumonia. Patient was started on antibiotics for pneumonia. Troponin elevated at 1.65 in absence of EKG changes. Given CAD risk factors patient was started on a heparin drip, cardiology was consulted.. Patient subsequently transferred [facility] where she underwent a left heart cath. Findings included total occlusion of the mid LAD which was successfully stented.   Patient hasn't had any further chest pain. Regarding suspected pneumonia, patient has had sinus drainage over the last several days but feels it is sinus related. She has had a dry cough at home. Patient has asthma, uses albuterol inhaler as needed. No previous history of pna.   Patient was diagnosed with diabetes at age 31. She she Lantus insulin as well as Novolog with meals. Generally checks CBGs 2 hours after meals and "if I have been good " sugars will be in the 120 range, otherwise they may be over 200".    Review of Systems:  ROS As per HPI otherwise 10 point review of systems negative.    Past Medical History: Past Medical History:  Diagnosis Date  . ARF (acute renal failure) (HCC) 04/2015  . Asthma   . Cellulitis of right upper extremity   . Diabetes mellitus    insulin dependent  . Hypertension   . Obesity     Past Surgical History: Past Surgical History:  Procedure Laterality Date  . CARDIAC CATHETERIZATION N/A 09/07/2016   Procedure: Left Heart  Cath and Coronary Angiography;  Surgeon: Lyn Records, MD;  Location: [facility] INVASIVE CV LAB;  Service: Cardiovascular;  Laterality: N/A;  . CARDIAC CATHETERIZATION N/A 09/07/2016   Procedure: Coronary Stent Intervention;  Surgeon: Lyn Records, MD;  Location: [facility] INVASIVE CV LAB;  Service: Cardiovascular;  Laterality: N/A;  . CARDIAC CATHETERIZATION N/A 09/07/2016   Procedure: Coronary Balloon Angioplasty;  Surgeon: Lyn Records, MD;  Location: [facility] INVASIVE CV LAB;  Service: Cardiovascular;  Laterality: N/A;  . CESAREAN SECTION    . IRRIGATION AND DEBRIDEMENT SHOULDER Right 04/30/2015   Procedure: IRRIGATION AND DEBRIDEMENT SHOULDER;  Surgeon: Sheral Apley, MD;  Location: [facility] OR;  Service: Orthopedics;  Laterality: Right;  . IRRIGATION AND DEBRIDEMENT SHOULDER Right 05/01/2015  . LEG SURGERY    . SHOULDER ARTHROSCOPY Right 04/30/2015   Procedure: ARTHROSCOPY SHOULDER;  Surgeon: Sheral Apley, MD;  Location: [facility] OR;  Service: Orthopedics;  Laterality: Right;  . TONSILLECTOMY     Allergies:   Allergies  Allergen Reactions  . Hydrazine Yellow [Tartrazine] Shortness Of Breath and Swelling    Swelling mostly noticed in legs and feet, retaining urination, shortness of breaht, and minor chest pain  . Lisinopril Shortness Of Breath    Was on prinzide; had sob/chest pain on it.  . Tylenol [Acetaminophen] Itching and Swelling    Itching of the mouth, swelling of tongue and stomach started hurting    Social History:  reports that she has never  smoked. She has never used smokeless tobacco. She reports that she drinks alcohol. She reports that she does not use drugs.   Family History: Family History  Problem Relation Age of Onset  . Diabetes Mother   . Hypertension Mother   . Heart disease Mother   . Diabetes Father   . Heart disease Father   . Stroke Maternal Grandmother   . Cancer Maternal Grandmother    Physical Exam: Vitals:   09/08/16 0400 09/08/16 0600 09/08/16 0744 09/08/16 0800    BP: 117/79 119/88 124/88 131/89  Pulse: 87 85 89 85  Resp: (!) 21 19 20  (!) 24  Temp: 98.6 F (37 C)  98.3 F (36.8 C)   TempSrc: Oral  Oral   SpO2: 100% 100% 100% 100%  Weight:      Height:        Constitutional: Pleasant obese, black female in NAD.  Alert and awake, oriented x3, not in any acute distress. Eyes: PERLA, EOMI, irises appear normal, anicteric sclera,  ENMT: external ears and nose appear normal, normal hearing. Lips appears normal, oropharynx mucosa, tongue, posterior pharynx appear normal  Neck: neck appears normal, no masses, normal ROM CVS: S1-S2 clear, no murmur rubs or gallops, no LE edema, normal pedal pulses  Respiratory:  clear to auscultation bilaterally, no wheezing, rales or rhonchi. Respiratory effort normal. No accessory muscle use.  Abdomen: soft, obese, nontender, nondistended, normal bowel sounds Musculoskeletal: : no cyanosis, clubbing or edema noted bilaterally. No contractures. 5/5 strength in all extremities.  Neuro: Cranial nerves II-XII intact, strength, sensation, reflexes Psych: judgement and insight appear normal, stable mood and affect, mental status Skin: no rashes or lesions or ulcers, no induration or nodules   Data reviewed:  I have personally reviewed following labs and imaging studies Labs:  CBC:  Recent Labs Lab 09/06/16 1620 09/07/16 0351 09/07/16 1213 09/08/16 0234  WBC 7.3 8.2 7.6 8.2  HGB 11.8* 12.1 11.2* 11.4*  HCT 36.3 38.0 34.7* 36.1  MCV 79.8 80.3 80.3 81.7  PLT 208 233 208 216    Basic Metabolic Panel:  Recent Labs Lab 09/06/16 1620 09/07/16 1213 09/08/16 0234  NA 135  --  134*  K 4.3  --  4.0  CL 103  --  103  CO2 25  --  22  GLUCOSE 401*  --  284*  BUN 14  --  11  CREATININE 0.77 0.71 0.83  CALCIUM 8.9  --  8.6*    Cardiac Enzymes:  Recent Labs Lab 09/06/16 2217 09/07/16 0351 09/08/16 0234  TROPONINI 1.65* 4.74* 6.64*   BNP: Invalid input(s): POCBNP CBG:  Recent Labs Lab 09/07/16 1619  09/07/16 2140 09/08/16 0031 09/08/16 0425 09/08/16 0743  GLUCAP 287* 293* 294* 248* 211*   D-Dimer  Recent Labs  09/06/16 1815  DDIMER <0.27   Lipid Profile  Recent Labs  09/07/16 0405  CHOL 201*  HDL 40*  LDLCALC 117*  TRIG 219*  CHOLHDL 5.0   Urinalysis    Component Value Date/Time   COLORURINE YELLOW 09/07/2016 0325   APPEARANCEUR CLEAR 09/07/2016 0325   LABSPEC >1.046 (H) 09/07/2016 0325   PHURINE 5.5 09/07/2016 0325   GLUCOSEU >1000 (A) 09/07/2016 0325   HGBUR NEGATIVE 09/07/2016 0325   BILIRUBINUR NEGATIVE 09/07/2016 0325   BILIRUBINUR negative 06/10/2016 1250   KETONESUR NEGATIVE 09/07/2016 0325   PROTEINUR NEGATIVE 09/07/2016 0325   UROBILINOGEN 0.2 06/10/2016 1250   UROBILINOGEN 0.2 05/16/2015 0623   NITRITE NEGATIVE 09/07/2016 0325   LEUKOCYTESUR  NEGATIVE 09/07/2016 0325     Microbiology Recent Results (from the past 240 hour(s))  MRSA PCR Screening     Status: None   Collection Time: 09/07/16  3:27 AM  Result Value Ref Range Status   MRSA by PCR NEGATIVE NEGATIVE Final    Comment:        The GeneXpert MRSA Assay (FDA approved for NASAL specimens only), is one component of a comprehensive MRSA colonization surveillance program. It is not intended to diagnose MRSA infection nor to guide or monitor treatment for MRSA infections.    Inpatient Medications:   Scheduled Meds: . amLODipine  10 mg Oral Daily  . aspirin  81 mg Oral Daily  . atorvastatin  80 mg Oral q1800  . azithromycin  500 mg Intravenous Q0600  . cefTRIAXone (ROCEPHIN)  IV  1 g Intravenous QHS  . enoxaparin (LOVENOX) injection  40 mg Subcutaneous Q24H  . gabapentin  300 mg Oral TID  . insulin aspart  0-15 Units Subcutaneous Q4H  . losartan  25 mg Oral Daily  . metoprolol tartrate  25 mg Oral BID  . sodium chloride flush  3 mL Intravenous Q12H  . ticagrelor  90 mg Oral BID   Continuous Infusions:    Radiological Exams on Admission: Dg Chest 2 View  Result Date:  09/06/2016 CLINICAL DATA:  Acute chest pain and shortness of breath today. EXAM: CHEST  2 VIEW COMPARISON:  12/14/2014 and 06/10/2011 radiographs FINDINGS: The cardiomediastinal silhouette is unremarkable. Mild peribronchial thickening has slightly increased. There is no evidence of focal airspace disease, pulmonary edema, suspicious pulmonary nodule/mass, pleural effusion, or pneumothorax. No acute bony abnormalities are identified. IMPRESSION: Slightly increasing mild peribronchial thickening. No evidence of focal airspace disease. Electronically Signed   By: Harmon Pier M.D.   On: 09/06/2016 17:35   Ct Angio Chest/abd/pel For Dissection W And/or W/wo  Result Date: 09/07/2016 CLINICAL DATA:  35 y/o F; sharp central chest pain with dizziness and shortness of breath. EXAM: CT ANGIOGRAPHY CHEST, ABDOMEN AND PELVIS TECHNIQUE: Multidetector CT imaging through the chest, abdomen and pelvis was performed using the standard protocol during bolus administration of intravenous contrast. Multiplanar reconstructed images and MIPs were obtained and reviewed to evaluate the vascular anatomy. CONTRAST:  200 cc Isovue 370.  A COMPARISON:  None. FINDINGS: CTA CHEST FINDINGS Normal heart size. No pericardial effusion. Mild coronary artery calcifications. Normal size main pulmonary artery. Normal caliber of thoracic aorta. No evidence of dissection, penetrating ulcer, or intramural hematoma of the thoracic aorta. No mediastinal lymphadenopathy. There are small clusters of nodules within the apical and basal posterior segments of the left lower lobe compatible with early bronchopneumonia. Lungs are otherwise clear. No pleural effusion or pneumothorax. No acute osseous abnormality or suspicious bone lesion is identified. Review of the MIP images confirms the above findings. CTA ABDOMEN AND PELVIS FINDINGS Nonspecific calcifications within the spleen and single calcification within left lobe of liver likely represents sequelae of  prior granulomatous disease. The liver is otherwise unremarkable. Normal gallbladder. No biliary ductal dilatation. No appreciable mass or inflammatory changes of the pancreas. Borderline splenomegaly.  Calcified granulomas of spleen. Normal adrenal glands. No focal lesion or inflammatory changes of the kidneys. No kidney stone or obstructive uropathy. The stomach is unremarkable. No obstructive or inflammatory changes of the bowel. Normal appendix. No lymphadenopathy. Normal caliber thoracic abdominal aorta. No evidence of otherwise unremarkable CT of the chest abdomen and pelvis for age. Dissection, penetrating ulcer, aneurysm, or intramural hematoma. Good opacification  of the portal venous system. No suspicious osseous lesion or acute osseous abnormality. Uterus and adnexa are unremarkable. Review of the MIP images confirms the above findings. IMPRESSION: 1. No aneurysm or dissection of the aorta is identified. No acute vascular abnormality. 2. Clusters of nodules in the left lower lobe are probably represent early bronchopneumonia. These results were called by telephone at the time of interpretation on 09/07/2016 at 1:01 am to [facility], who verbally acknowledged these results. Electronically Signed   By: Mitzi Hansen M.D.   On: 09/07/2016 01:01    Impression/Recommendations Active Problems:  Diabetes mellitus due to underlying condition without complications (HCC) Community acquired pneumonia NSTEMI (non-ST elevated myocardial infarction) (HCC)   CAP. On day #2 of Azithromycin / Rocephin. No significant cough. No respiratory distress. Plan is discharge home tomorrow per Cardiology.  - Tomorrow will be 3rd day of IV antibiotics, will give an additional two days of PO antibiotics upon discharge tomorrow (probably Azithromycin or Levaquin).  Uncontrolled Diabetes, A1c in June was ~13.  -Consult to Diabetes Coordinator placed. She needs dietary guidelines, weight loss, education about the  consequences of uncontrolled diabetes. Outpatient referral to Dietician?  -patient has a diet, will resume her home Lantus. She takes 60 units a day. Not much benefit to increasing dose beyond 60 units. Will try spit dose of 40 units BID. While inpatient will keep on SSI but changing from moderate to resistant scale. -Repeat A1c.   NSTEMI, s/p PCI to LAD yesterday. Cardiology planning for discharge home tomorrow.   Thank you for this consultation.  Our [facility] hospitalist team will follow the patient with you.  Willette Cluster M.D. [facility] 09/08/2016, 10:53 AM

## 2016-09-08 NOTE — Progress Notes (Signed)
CARDIAC REHAB PHASE I   PRE:  Rate/Rhythm: 90 SR    BP: sitting 136/91    SaO2: 97 RA  MODE:  Ambulation: 350 ft   POST:  Rate/Rhythm: 109 ST    BP: sitting 151/107     SaO2: 99 RA  Tolerated well without c/o. Enjoyed walking. BP elevated. Ed completed with pt with good reception. Knows she needs to "tighten up" on diet. Highly encouraged more ex as well. Understands importance of Brilinta. Will send referral to [place] [facility]. Encouraged more walking today and to watch ed videos. [id number]   Harriet Masson CES, ACSM 09/08/2016 2:40 PM

## 2016-09-08 NOTE — Progress Notes (Signed)
  Echocardiogram 2D Echocardiogram with Definity has been performed.  Lindsay [name] 09/08/2016, 10:59 AM

## 2016-09-08 NOTE — Progress Notes (Signed)
CRITICAL VALUE ALERT  Critical value received:  Troponin 6.64  Date of notification:  09/08/2016  Time of notification:  0311  Critical value read back:Yes.    Nurse who received alert:  Owens Loffler, RN  MD notified (1st page):  Shirlee Latch (Cards Fellow)  Time of first page:  [phone]  *expected lab value

## 2016-09-09 ENCOUNTER — Telehealth: Payer: Self-pay | Admitting: Cardiology

## 2016-09-09 DIAGNOSIS — E089 Diabetes mellitus due to underlying condition without complications: Secondary | ICD-10-CM

## 2016-09-09 LAB — CBC
HCT: 32.8 % — ABNORMAL LOW (ref 36.0–46.0)
Hemoglobin: 10.3 g/dL — ABNORMAL LOW (ref 12.0–15.0)
MCH: 25.6 pg — AB (ref 26.0–34.0)
MCHC: 31.4 g/dL (ref 30.0–36.0)
MCV: 81.6 fL (ref 78.0–100.0)
PLATELETS: 208 10*3/uL (ref 150–400)
RBC: 4.02 MIL/uL (ref 3.87–5.11)
RDW: 14 % (ref 11.5–15.5)
WBC: 7 10*3/uL (ref 4.0–10.5)

## 2016-09-09 LAB — GLUCOSE, CAPILLARY
GLUCOSE-CAPILLARY: 243 mg/dL — AB (ref 65–99)
GLUCOSE-CAPILLARY: 183 mg/dL — AB (ref 65–99)
Glucose-Capillary: 222 mg/dL — ABNORMAL HIGH (ref 65–99)
Glucose-Capillary: 230 mg/dL — ABNORMAL HIGH (ref 65–99)

## 2016-09-09 MED ORDER — NITROGLYCERIN 0.4 MG SL SUBL: 0.4000 mg | SUBLINGUAL_TABLET | SUBLINGUAL | 12 refills | Status: DC | PRN

## 2016-09-09 MED ORDER — AZITHROMYCIN 250 MG PO TABS: 250.0000 mg | ORAL_TABLET | Freq: Every day | ORAL | 0 refills | Status: DC

## 2016-09-09 MED ORDER — TICAGRELOR 90 MG PO TABS: 90.0000 mg | ORAL_TABLET | Freq: Two times a day (BID) | ORAL | 11 refills | Status: DC

## 2016-09-09 MED ORDER — LOSARTAN POTASSIUM 25 MG PO TABS: 25.0000 mg | ORAL_TABLET | Freq: Every day | ORAL | 6 refills | Status: DC

## 2016-09-09 MED ORDER — METOPROLOL TARTRATE 25 MG PO TABS: 25.0000 mg | ORAL_TABLET | Freq: Two times a day (BID) | ORAL | 6 refills | Status: DC

## 2016-09-09 MED ORDER — LEVOFLOXACIN 500 MG PO TABS: 500.0000 mg | ORAL_TABLET | Freq: Once | ORAL | Status: DC

## 2016-09-09 MED ORDER — ATORVASTATIN CALCIUM 80 MG PO TABS: 80.0000 mg | ORAL_TABLET | Freq: Every day | ORAL | 6 refills | Status: DC

## 2016-09-09 MED ORDER — INSULIN GLARGINE 100 UNITS/ML SOLOSTAR PEN: 40.0000 [IU] | PEN_INJECTOR | Freq: Two times a day (BID) | SUBCUTANEOUS | 3 refills | Status: DC

## 2016-09-09 MED ORDER — LEVOFLOXACIN IN D5W 500 MG/100ML IV SOLN
500.0000 mg | Freq: Once | INTRAVENOUS | Status: DC
  Filled 2016-09-09: qty 100

## 2016-09-09 NOTE — Telephone Encounter (Signed)
New message      TCM appt on 09-16-16 per Vin

## 2016-09-09 NOTE — Progress Notes (Signed)
Patient Name: Lindsay [name] Date of Encounter: 09/09/2016   SUBJECTIVE  Intermittent chest pain that has been improving. Ambulated yesterday with out dyspnea and SOB.   CURRENT MEDS . amLODipine  10 mg Oral Daily  . aspirin  81 mg Oral Daily  . atorvastatin  80 mg Oral q1800  . azithromycin  500 mg Intravenous Q0600  . cefTRIAXone (ROCEPHIN)  IV  1 g Intravenous QHS  . enoxaparin (LOVENOX) injection  40 mg Subcutaneous Q24H  . gabapentin  300 mg Oral TID  . insulin aspart  0-20 Units Subcutaneous TID WC  . insulin glargine  40 Units Subcutaneous BID  . losartan  25 mg Oral Daily  . metoprolol tartrate  25 mg Oral BID  . sodium chloride flush  3 mL Intravenous Q12H  . ticagrelor  90 mg Oral BID    OBJECTIVE  Vitals:   09/08/16 1527 09/08/16 2011 09/08/16 2259 09/09/16 0500  BP: 130/79 124/78  128/75  Pulse: 90 90    Resp:  20 (!) 25 20  Temp: 98.5 F (36.9 C) 98.9 F (37.2 C)  98.8 F (37.1 C)  TempSrc: Oral Oral  Oral  SpO2: 99% 100%  95%  Weight:      Height:        Intake/Output Summary (Last 24 hours) at 09/09/16 0753 Last data filed at 09/09/16 0555  Gross per 24 hour  Intake             1030 ml  Output                0 ml  Net             1030 ml   Filed Weights   09/07/16 0327  Weight: (!) 367 lb 11.6 oz (166.8 kg)    PHYSICAL EXAM  General: Pleasant, obese female in  NAD. Neuro: Alert and oriented X 3. Moves all extremities spontaneously. Psych: Normal affect. HEENT:  Normal  Neck: Supple without bruits or JVD. Lungs:  Resp regular and unlabored, CTA. Heart: RRR no s3, s4, or murmurs. Abdomen: Soft, non-tender, non-distended, BS + x 4.  Extremities: No clubbing, cyanosis or edema. DP/PT/Radials 2+ and equal bilaterally. R radial cath site without hematoma.   Accessory Clinical Findings  CBC  Recent Labs  09/08/16 0234 09/09/16 0501  WBC 8.2 7.0  HGB 11.4* 10.3*  HCT 36.1 32.8*  MCV 81.7 81.6  PLT 216 208   Basic Metabolic  Panel  Recent Labs  01/74/94 1620 09/07/16 1213 09/08/16 0234  NA 135  --  134*  K 4.3  --  4.0  CL 103  --  103  CO2 25  --  22  GLUCOSE 401*  --  284*  BUN 14  --  11  CREATININE 0.77 0.71 0.83  CALCIUM 8.9  --  8.6*   Liver Function Tests No results for input(s): AST, ALT, ALKPHOS, BILITOT, PROT, ALBUMIN in the last 72 hours. No results for input(s): LIPASE, AMYLASE in the last 72 hours. Cardiac Enzymes  Recent Labs  09/06/16 2217 09/07/16 0351 09/08/16 0234  TROPONINI 1.65* 4.74* 6.64*   BNP Invalid input(s): POCBNP D-Dimer  Recent Labs  09/06/16 1815  DDIMER <0.27   Hemoglobin A1C  Recent Labs  09/08/16 0234  HGBA1C 12.4*   Fasting Lipid Panel  Recent Labs  09/07/16 0405  CHOL 201*  HDL 40*  LDLCALC 117*  TRIG 219*  CHOLHDL 5.0   Thyroid Function Tests No  results for input(s): TSH, T4TOTAL, T3FREE, THYROIDAB in the last 72 hours.  Invalid input(s): FREET3  TELE  Sinus rhythm  Radiology/Studies  Dg Chest 2 View  Result Date: 09/06/2016 CLINICAL DATA:  Acute chest pain and shortness of breath today. EXAM: CHEST  2 VIEW COMPARISON:  12/14/2014 and 06/10/2011 radiographs FINDINGS: The cardiomediastinal silhouette is unremarkable. Mild peribronchial thickening has slightly increased. There is no evidence of focal airspace disease, pulmonary edema, suspicious pulmonary nodule/mass, pleural effusion, or pneumothorax. No acute bony abnormalities are identified. IMPRESSION: Slightly increasing mild peribronchial thickening. No evidence of focal airspace disease. Electronically Signed   By: Harmon Pier M.D.   On: 09/06/2016 17:35   Ct Angio Chest/abd/pel For Dissection W And/or W/wo  Result Date: 09/07/2016 CLINICAL DATA:  35 y/o F; sharp central chest pain with dizziness and shortness of breath. EXAM: CT ANGIOGRAPHY CHEST, ABDOMEN AND PELVIS TECHNIQUE: Multidetector CT imaging through the chest, abdomen and pelvis was performed using the standard  protocol during bolus administration of intravenous contrast. Multiplanar reconstructed images and MIPs were obtained and reviewed to evaluate the vascular anatomy. CONTRAST:  200 cc Isovue 370.  A COMPARISON:  None. FINDINGS: CTA CHEST FINDINGS Normal heart size. No pericardial effusion. Mild coronary artery calcifications. Normal size main pulmonary artery. Normal caliber of thoracic aorta. No evidence of dissection, penetrating ulcer, or intramural hematoma of the thoracic aorta. No mediastinal lymphadenopathy. There are small clusters of nodules within the apical and basal posterior segments of the left lower lobe compatible with early bronchopneumonia. Lungs are otherwise clear. No pleural effusion or pneumothorax. No acute osseous abnormality or suspicious bone lesion is identified. Review of the MIP images confirms the above findings. CTA ABDOMEN AND PELVIS FINDINGS Nonspecific calcifications within the spleen and single calcification within left lobe of liver likely represents sequelae of prior granulomatous disease. The liver is otherwise unremarkable. Normal gallbladder. No biliary ductal dilatation. No appreciable mass or inflammatory changes of the pancreas. Borderline splenomegaly.  Calcified granulomas of spleen. Normal adrenal glands. No focal lesion or inflammatory changes of the kidneys. No kidney stone or obstructive uropathy. The stomach is unremarkable. No obstructive or inflammatory changes of the bowel. Normal appendix. No lymphadenopathy. Normal caliber thoracic abdominal aorta. No evidence of otherwise unremarkable CT of the chest abdomen and pelvis for age. Dissection, penetrating ulcer, aneurysm, or intramural hematoma. Good opacification of the portal venous system. No suspicious osseous lesion or acute osseous abnormality. Uterus and adnexa are unremarkable. Review of the MIP images confirms the above findings. IMPRESSION: 1. No aneurysm or dissection of the aorta is identified. No acute  vascular abnormality. 2. Clusters of nodules in the left lower lobe are probably represent early bronchopneumonia. These results were called by telephone at the time of interpretation on 09/07/2016 at 1:01 am to [facility], who verbally acknowledged these results. Electronically Signed   By: Mitzi Hansen M.D.   On: 09/07/2016 01:01    ASSESSMENT AND PLAN  1. NSTEMI - Peak of troponin 6.64 post cath. Will repeat today. S/p PTCA & DES to mild LAD and PTCA only to 2nd diagonal ostium (There is a 50% residual stenosis post intervention). There is a 25-40% stenosis in  in the proximal circumflex, and 50% segmental stenosis in the proximal first obtuse marginal. EF of 35-45% by cath. Echo showed improved EF to 55-60%. Intermittent chest pain, improving. No dyspnea.  - Continue ASA, Brillinta, BB, ARB and statin. (she had SOB on ACE previously).   2. Essential hypertensive heart disease  -  BP has been stable and well controlled. Continue ACE/BB and Norvasc.   3. ICM - Improved post stent. As above.   4. HLD - 09/07/2016: Cholesterol 201; HDL 40; LDL Cholesterol 117; Triglycerides 219; VLDL 44  - LDL goal less than 70. Will need lipid panel and LFT in 6 weeks.  5. DM - Uncontrolled. Appreciate IM recommendation. F/u with PCP  6. Possible CAP - On IV abx. Plant to change to po at discharge. Appreciate IM recommendation.   7. Anemia - likely due to current monthly cycle. F/u with PCP.  8. Morbid obesity - Body mass index is 55.91 kg/m. advised wt loss, daily exercise and healthy eating.    Dispo: Likely discharge later today pending ambulation and troponin level.    Signed, Manson PasseyBhagat,Bhavinkumar PA-C Pager [phone]  Personally seen and examined. Agree with above.  Ready for DC NSTEMI Cardiac rehab Weight loss Doreatha MartinFinish out short course of ABX Expressed importance of DAPT Cath site normal. Lungs clear, heart reg.   Donato SchultzMark Akeisha Lagerquist, MD

## 2016-09-09 NOTE — Discharge Instructions (Signed)
Radial Site Care Refer to this sheet in the next few weeks. These instructions provide you with information about caring for yourself after your procedure. Your health care provider may also give you more specific instructions. Your treatment has been planned according to current medical practices, but problems sometimes occur. Call your health care provider if you have any problems or questions after your procedure. WHAT TO EXPECT AFTER THE PROCEDURE After your procedure, it is typical to have the following:  Bruising at the radial site that usually fades within 1-2 weeks.  Blood collecting in the tissue (hematoma) that may be painful to the touch. It should usually decrease in size and tenderness within 1-2 weeks. HOME CARE INSTRUCTIONS  Take medicines only as directed by your health care provider.  You may shower 24-48 hours after the procedure or as directed by your health care provider. Remove the bandage (dressing) and gently wash the site with plain soap and water. Pat the area dry with a clean towel. Do not rub the site, because this may cause bleeding.  Do not take baths, swim, or use a hot tub until your health care provider approves.  Check your insertion site every day for redness, swelling, or drainage.  Do not apply powder or lotion to the site.  Do not flex or bend the affected arm for 24 hours or as directed by your health care provider.  Do not push or pull heavy objects with the affected arm for 24 hours or as directed by your health care provider.  Do not lift over 10 lb (4.5 kg) for 5 days after your procedure or as directed by your health care provider.  Ask your health care provider when it is okay to:  Return to work or school.  Resume usual physical activities or sports.  Resume sexual activity.  Do not drive home if you are discharged the same day as the procedure. Have someone else drive you.  You may drive 24 hours after the procedure unless otherwise  instructed by your health care provider.  Do not operate machinery or power tools for 24 hours after the procedure.  If your procedure was done as an outpatient procedure, which means that you went home the same day as your procedure, a responsible adult should be with you for the first 24 hours after you arrive home.  Keep all follow-up visits as directed by your health care provider. This is important. SEEK MEDICAL CARE IF:  You have a fever.  You have chills.  You have increased bleeding from the radial site. Hold pressure on the site. SEEK IMMEDIATE MEDICAL CARE IF:  You have unusual pain at the radial site.  You have redness, warmth, or swelling at the radial site.  You have drainage (other than a small amount of blood on the dressing) from the radial site.  The radial site is bleeding, and the bleeding does not stop after 30 minutes of holding steady pressure on the site.  Your arm or hand becomes pale, cool, tingly, or numb.   This information is not intended to replace advice given to you by your health care provider. Make sure you discuss any questions you have with your health care provider.   Document Released: 01/09/2011 Document Revised: 12/28/2014 Document Reviewed: 06/25/2014 Elsevier Interactive Patient Education 2016 [employer].   Diabetes Mellitus and Food It is important for you to manage your blood sugar (glucose) level. Your blood glucose level can be greatly affected by what you  eat. Eating healthier foods in the appropriate amounts throughout the day at about the same time each day will help you control your blood glucose level. It can also help slow or prevent worsening of your diabetes mellitus. Healthy eating may even help you improve the level of your blood pressure and reach or maintain a healthy weight.  General recommendations for healthful eating and cooking habits include:  Eating meals and snacks regularly. Avoid going long periods of time  without eating to lose weight.  Eating a diet that consists mainly of plant-based foods, such as fruits, vegetables, nuts, legumes, and whole grains.  Using low-heat cooking methods, such as baking, instead of high-heat cooking methods, such as deep frying. Work with your dietitian to make sure you understand how to use the Nutrition Facts information on food labels. HOW CAN FOOD AFFECT ME? Carbohydrates Carbohydrates affect your blood glucose level more than any other type of food. Your dietitian will help you determine how many carbohydrates to eat at each meal and teach you how to count carbohydrates. Counting carbohydrates is important to keep your blood glucose at a healthy level, especially if you are using insulin or taking certain medicines for diabetes mellitus. Alcohol Alcohol can cause sudden decreases in blood glucose (hypoglycemia), especially if you use insulin or take certain medicines for diabetes mellitus. Hypoglycemia can be a life-threatening condition. Symptoms of hypoglycemia (sleepiness, dizziness, and disorientation) are similar to symptoms of having too much alcohol.  If your health care provider has given you approval to drink alcohol, do so in moderation and use the following guidelines:  Women should not have more than one drink per day, and men should not have more than two drinks per day. One drink is equal to:  12 oz of beer.  5 oz of wine.  1 oz of hard liquor.  Do not drink on an empty stomach.  Keep yourself hydrated. Have water, diet soda, or unsweetened iced tea.  Regular soda, juice, and other mixers might contain a lot of carbohydrates and should be counted. WHAT FOODS ARE NOT RECOMMENDED? As you make food choices, it is important to remember that all foods are not the same. Some foods have fewer nutrients per serving than other foods, even though they might have the same number of calories or carbohydrates. It is difficult to get your body what it  needs when you eat foods with fewer nutrients. Examples of foods that you should avoid that are high in calories and carbohydrates but low in nutrients include:  Trans fats (most processed foods list trans fats on the Nutrition Facts label).  Regular soda.  Juice.  Candy.  Sweets, such as cake, pie, doughnuts, and cookies.  Fried foods. WHAT FOODS CAN I EAT? Eat nutrient-rich foods, which will nourish your body and keep you healthy. The food you should eat also will depend on several factors, including:  The calories you need.  The medicines you take.  Your weight.  Your blood glucose level.  Your blood pressure level.  Your cholesterol level. You should eat a variety of foods, including:  Protein.  Lean cuts of meat.  Proteins low in saturated fats, such as fish, egg whites, and beans. Avoid processed meats.  Fruits and vegetables.  Fruits and vegetables that may help control blood glucose levels, such as apples, mangoes, and yams.  Dairy products.  Choose fat-free or low-fat dairy products, such as milk, yogurt, and cheese.  Grains, bread, pasta, and rice.  Choose whole  grain products, such as multigrain bread, whole oats, and brown rice. These foods may help control blood pressure.  Fats.  Foods containing healthful fats, such as nuts, avocado, olive oil, canola oil, and fish. DOES EVERYONE WITH DIABETES MELLITUS HAVE THE SAME MEAL PLAN? Because every person with diabetes mellitus is different, there is not one meal plan that works for everyone. It is very important that you meet with a dietitian who will help you create a meal plan that is just right for you.   This information is not intended to replace advice given to you by your health care provider. Make sure you discuss any questions you have with your health care provider.   Document Released: 09/03/2005 Document Revised: 12/28/2014 Document Reviewed: 11/03/2013 Elsevier Interactive Patient Education  2016 Elsevier Inc.  Heart-Healthy Eating Plan Many factors influence your heart health, including eating and exercise habits. Heart (coronary) risk increases with abnormal blood fat (lipid) levels. Heart-healthy meal planning includes limiting unhealthy fats, increasing healthy fats, and making other small dietary changes. This includes maintaining a healthy body weight to help keep lipid levels within a normal range. WHAT IS MY PLAN?  Your health care provider recommends that you:  Get no more than _________% of the total calories in your daily diet from fat.  Limit your intake of saturated fat to less than _________% of your total calories each day.  Limit the amount of cholesterol in your diet to less than _________ mg per day. WHAT TYPES OF FAT SHOULD I CHOOSE?  Choose healthy fats more often. Choose monounsaturated and polyunsaturated fats, such as olive oil and canola oil, flaxseeds, walnuts, almonds, and seeds.  Eat more omega-3 fats. Good choices include salmon, mackerel, sardines, tuna, flaxseed oil, and ground flaxseeds. Aim to eat fish at least two times each week.  Limit saturated fats. Saturated fats are primarily found in animal products, such as meats, butter, and cream. Plant sources of saturated fats include palm oil, palm kernel oil, and coconut oil.  Avoid foods with partially hydrogenated oils in them. These contain trans fats. Examples of foods that contain trans fats are stick margarine, some tub margarines, cookies, crackers, and other baked goods. WHAT GENERAL GUIDELINES DO I NEED TO FOLLOW?  Check food labels carefully to identify foods with trans fats or high amounts of saturated fat.  Fill one half of your plate with vegetables and green salads. Eat 4-5 servings of vegetables per day. A serving of vegetables equals 1 cup of raw leafy vegetables,  cup of raw or cooked cut-up vegetables, or  cup of vegetable juice.  Fill one fourth of your plate with whole  grains. Look for the word "whole" as the first word in the ingredient list.  Fill one fourth of your plate with lean protein foods.  Eat 4-5 servings of fruit per day. A serving of fruit equals one medium whole fruit,  cup of dried fruit,  cup of fresh, frozen, or canned fruit, or  cup of 100% fruit juice.  Eat more foods that contain soluble fiber. Examples of foods that contain this type of fiber are apples, broccoli, carrots, beans, peas, and barley. Aim to get 20-30 g of fiber per day.  Eat more home-cooked food and less restaurant, buffet, and fast food.  Limit or avoid alcohol.  Limit foods that are high in starch and sugar.  Avoid fried foods.  Cook foods by using methods other than frying. Baking, boiling, grilling, and broiling are all great options.  Other fat-reducing suggestions include:  Removing the skin from poultry.  Removing all visible fats from meats.  Skimming the fat off of stews, soups, and gravies before serving them.  Steaming vegetables in water or broth.  Lose weight if you are overweight. Losing just 5-10% of your initial body weight can help your overall health and prevent diseases such as diabetes and heart disease.  Increase your consumption of nuts, legumes, and seeds to 4-5 servings per week. One serving of dried beans or legumes equals  cup after being cooked, one serving of nuts equals 1 ounces, and one serving of seeds equals  ounce or 1 tablespoon.  You may need to monitor your salt (sodium) intake, especially if you have high blood pressure. Talk with your health care provider or dietitian to get more information about reducing sodium. WHAT FOODS CAN I EAT? Grains Breads, including [place], white, pita, wheat, raisin, rye, oatmeal, and [place]. Tortillas that are neither fried nor made with lard or trans fat. Low-fat rolls, including hotdog and hamburger buns and English muffins. Biscuits. Muffins. Waffles. Pancakes. Light popcorn. Whole-grain  cereals. Flatbread. Melba toast. Pretzels. Breadsticks. Rusks. Low-fat snacks and crackers, including oyster, saltine, matzo, graham, animal, and rye. Rice and pasta, including brown rice and those that are made with whole wheat. Vegetables All vegetables. Fruits All fruits, but limit coconut. Meats and Other Protein Sources Lean, well-trimmed beef, veal, pork, and lamb. Chicken and [place] without skin. All fish and shellfish. Wild duck, rabbit, pheasant, and venison. Egg whites or low-cholesterol egg substitutes. Dried beans, peas, lentils, and tofu.Seeds and most nuts. Dairy Low-fat or nonfat cheeses, including ricotta, string, and mozzarella. Skim or 1% milk that is liquid, powdered, or evaporated. Buttermilk that is made with low-fat milk. Nonfat or low-fat yogurt. Beverages Mineral water. Diet carbonated beverages. Sweets and Desserts Sherbets and fruit ices. Honey, jam, marmalade, jelly, and syrups. Meringues and gelatins. Pure sugar candy, such as hard candy, jelly beans, gumdrops, mints, marshmallows, and small amounts of dark chocolate. [employer]. Eat all sweets and desserts in moderation. Fats and Oils Nonhydrogenated (trans-free) margarines. Vegetable oils, including soybean, sesame, sunflower, olive, peanut, safflower, corn, canola, and cottonseed. Salad dressings or mayonnaise that are made with a vegetable oil. Limit added fats and oils that you use for cooking, baking, salads, and as spreads. Other Cocoa powder. Coffee and tea. All seasonings and condiments. The items listed above may not be a complete list of recommended foods or beverages. Contact your dietitian for more options. WHAT FOODS ARE NOT RECOMMENDED? Grains Breads that are made with saturated or trans fats, oils, or whole milk. Croissants. Butter rolls. Cheese breads. Sweet rolls. Donuts. Buttered popcorn. Chow mein noodles. High-fat crackers, such as cheese or butter crackers. Meats and Other Protein  Sources Fatty meats, such as hotdogs, short ribs, sausage, spareribs, bacon, ribeye roast or steak, and mutton. High-fat deli meats, such as salami and bologna. Caviar. Domestic duck and goose. Organ meats, such as kidney, liver, sweetbreads, brains, gizzard, chitterlings, and heart. Dairy Cream, sour cream, cream cheese, and creamed cottage cheese. Whole milk cheeses, including blue (bleu), [address], [place], [place], [address], [place], [address], [place], [place], and [place]. Whole or 2% milk that is liquid, evaporated, or condensed. Whole buttermilk. Cream sauce or high-fat cheese sauce. Yogurt that is made from whole milk. Beverages Regular sodas and drinks with added sugar. Sweets and Desserts Frosting. Pudding. Cookies. Cakes other than angel food cake. Candy that has milk chocolate or white chocolate, hydrogenated fat,  butter, coconut, or unknown ingredients. Buttered syrups. Full-fat ice cream or ice cream drinks. Fats and Oils Gravy that has suet, meat fat, or shortening. Cocoa butter, hydrogenated oils, palm oil, coconut oil, palm kernel oil. These can often be found in baked products, candy, fried foods, nondairy creamers, and whipped toppings. Solid fats and shortenings, including bacon fat, salt pork, lard, and butter. Nondairy cream substitutes, such as coffee creamers and sour cream substitutes. Salad dressings that are made of unknown oils, cheese, or sour cream. The items listed above may not be a complete list of foods and beverages to avoid. Contact your dietitian for more information.   This information is not intended to replace advice given to you by your health care provider. Make sure you discuss any questions you have with your health care provider.   Document Released: 09/15/2008 Document Revised: 12/28/2014 Document Reviewed: 05/31/2014 Elsevier Interactive Patient Education [employer] Elsevier Inc.

## 2016-09-09 NOTE — Progress Notes (Signed)
CARDIAC REHAB PHASE I   PRE:  Rate/Rhythm: 85 SR    BP: sitting 129/93    SaO2:   MODE:  Ambulation: 550 ft   POST:  Rate/Rhythm: 102 ST    BP: sitting 139/99     SaO2:   Tolerated well. No c/o. Reviewed ed. DM coming in.  [id number]   Harriet Masson CES, ACSM 09/09/2016 11:43 AM

## 2016-09-09 NOTE — Discharge Summary (Signed)
Discharge Summary    Patient ID: Lindsay [name],  MRN: [id number], DOB/AGE: 1981/03/26 35 y.o.  Admit date: 09/06/2016 Discharge date: 09/09/2016  Primary Care Provider: Maren [name] Primary Cardiologist: New Dr. Tamala [name]  Discharge Diagnoses    Active Problems:   Diabetes mellitus due to underlying condition without complications ([place])   Pain in the chest   Community acquired pneumonia   Elevated troponin   NSTEMI (non-ST elevated myocardial infarction) ([place])   Diabetes mellitus with complication (HCC)   Allergies Allergies  Allergen Reactions  . Hydrazine Yellow [Tartrazine] Shortness Of Breath and Swelling    Swelling mostly noticed in legs and feet, retaining urination, shortness of breaht, and minor chest pain  . Lisinopril Shortness Of Breath    Was on prinzide; had sob/chest pain on it.  . Tylenol [Acetaminophen] Itching and Swelling    Itching of the mouth, swelling of tongue and stomach started hurting    Diagnostic Studies/Procedures    Coronary Balloon Angioplasty   09/07/16  Coronary Stent Intervention  Left Heart Cath and Coronary Angiography  Conclusion     The left ventricular ejection fraction is 35-45% by visual estimate.  LV end diastolic pressure is moderately elevated.  There is no mitral valve regurgitation.    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 This is a culprit for the patient's presentation and elevated troponins.  Successful PCI and stent implantation reducing the 100% stenosis to 0% with TIMI grade 3 flow. Final postdilatation diameter 3.5 mm. Xience Alpine 3.0 x 18 mm DES was used.  Successful PTCA of second diagonal ostium from 75% to 50% pre-stenting.  25-40% stenosis in the proximal circumflex, and 50% segmental stenosis in the proximal first obtuse marginal.  Anteroapical akinesis. LVEDP 26 mmHg. Estimated EF  35-45%.  Recommendations:   Aggressive risk factor modification including high-intensity statin therapy, aggressive glycemic control, weight reduction, blood pressure control, low-fat carbohydrate modified diet, and exercise.  Eligible for discharge in 24 hours assuming no complications.  IV heparin was discontinued.  Aspirin and Brilinta 6-12 months.  Beta blocker therapy and ACE/ARB to treat what I believe will be reversible LV systolic dysfunction.     Echo 09/08/16 LV EF: 55% -   60%  ------------------------------------------------------------------- Indications:      Chest pain 786.51.  ------------------------------------------------------------------- History:   PMH:  Obesity. Asthma. acute renal failure. Right upper extremity cellulitis.  Risk factors:  Hypertension. Diabetes mellitus.  ------------------------------------------------------------------- Study Conclusions  - Left ventricle: The cavity size was normal. There was mild   concentric hypertrophy. Systolic function was normal. The   estimated ejection fraction was in the range of 55% to 60%. Mild   hypokinesis of the anteroseptal, anterior, and apical myocardium.   Left ventricular diastolic function parameters were normal. - Mitral valve: Transvalvular velocity was within the normal range.   There was no evidence for stenosis. There was trivial   regurgitation. - Right ventricle: The cavity size was normal. Wall thickness was   normal. Systolic function was normal.   History of Present Illness     This 35 year old black female presented to the [facility] long emergency room 09/06/16 with sharp chest pain.  The patient has a history of diabetes since age 34 and has peripheral neuropathy.  She had a hospitalization last year with septic arthritis and during that hospitalization developed ATN with creatinine going as high as 5.8 possibly thought due to  vancomycin.  Her renal function has since  normalized.  She recently reestablished at the [facility] clinic and was started on lisinopril HCTZ but developed shortness of breath and chest pain that was sharp and the lisinopril component was discontinued.  She was cooking and had the onset of sharp midsternal chest discomfort that radiated somewhat to the left and the right side.  He was somewhat relieved with lying down and was worse when she sat up and was noted to be at least 4 out of 10.  She eventually called EMS when the pain became severe.  She noted it was somewhat pleuritic.  It did not radiate to her back.  She was mildly nauseated earlier.  The discomfort has persisted since she was here.  There were no initial acute EKG changes and she was mildly hypertensive on arrival.  She currently is complaining of 8 out of 10 sharp chest pain worse with deep breathing.  Initial troponin was not elevated but a subsequent troponin returned elevated at 0.94 although was a point of care one.  Blood pressure has not been well controlled.  She is relatively inactive and her diabetes has been poorly controlled.  She does have a history of asthma but is not currently wheezing. She was transferred to [facility] for further evaluation.   Hospital Course     Consultants: IM (initially as attending and then consultant).  1. NSTEMI - Peak of troponin 6.64 post cath. S/p PTCA & DES to mild LAD and PTCA only to 2nd diagonal ostium (There is a 50% residual stenosis post intervention). There is a 25-40% stenosis in  in the proximal circumflex, and 50% segmental stenosis in the proximal first obtuse marginal. EF of 35-45% by cath. Echo showed improved EF to 55-60%. Intermittent chest pain, improving. No dyspnea. Ambulated without chest pain or dyspnea.  - Continue ASA, Brillinta, BB, ARB and statin. (she had SOB on ACE previously).   2. Essential hypertensive heart disease  - BP has been stable and well controlled. Continue ACE/BB and Norvasc.   3. ICM -  Improved post stent. As above.   4. HLD - 09/07/2016: Cholesterol 201; HDL 40; LDL Cholesterol 117; Triglycerides 219; VLDL 44  - LDL goal less than 70. Will need lipid panel and LFT in 6 weeks.  5. DM - Uncontrolled. A1c 13.  Seen by IM and recommended change Lantus 40 units subcutaneous twice a day. Continue home Novolog.  F/u with PCP  6. Possible CAP/URI - Chest x-ray is not impressive, symptoms not impressive. Seen by IM. Given IV Azithromycin 572m and IV ceftriozone 1 g for 3 days. Discussed with Dr. SCandiss Norse(IM) and pharmacist. Will give two more days of Azith 2527mqd for 2 days.   7. Anemia - likely due to current monthly cycle. F/u with PCP.  8. Morbid obesity - Body mass index is 55.91 kg/m. advised wt loss, daily exercise and healthy eating.    The patient has been seen by Dr. SkMarlou Porchtoday and deemed ready for discharge home. All follow-up appointments have been scheduled. Discharge medications are listed below.  _____________   Discharge Vitals Blood pressure 128/75, pulse 90, temperature 98.8 F (37.1 C), temperature source Oral, resp. rate 20, height _0  (1.727 m), weight (!) 367 lb 11.6 oz (166.8 kg), last menstrual period 08/04/2016, SpO2 95 %.  Filed Weights   09/07/16 0327  Weight: (!) 367 lb 11.6 oz (166.8 kg)    Labs & Radiologic Studies  CBC  Recent Labs  09/08/16 0234 09/09/16 0501  WBC 8.2 7.0  HGB 11.4* 10.3*  HCT 36.1 32.8*  MCV 81.7 81.6  PLT 216 161   Basic Metabolic Panel  Recent Labs  09/06/16 1620 09/07/16 1213 09/08/16 0234  NA 135  --  134*  K 4.3  --  4.0  CL 103  --  103  CO2 25  --  22  GLUCOSE 401*  --  284*  BUN 14  --  11  CREATININE 0.77 0.71 0.83  CALCIUM 8.9  --  8.6*   Liver Function Tests No results for input(s): AST, ALT, ALKPHOS, BILITOT, PROT, ALBUMIN in the last 72 hours. No results for input(s): LIPASE, AMYLASE in the last 72 hours. Cardiac Enzymes  Recent Labs  09/06/16 2217 09/07/16 0351  09/08/16 0234  TROPONINI 1.65* 4.74* 6.64*   BNP Invalid input(s): POCBNP D-Dimer  Recent Labs  09/06/16 1815  DDIMER <0.27   Hemoglobin A1C  Recent Labs  09/08/16 0234  HGBA1C 12.4*   Fasting Lipid Panel  Recent Labs  09/07/16 0405  CHOL 201*  HDL 40*  LDLCALC 117*  TRIG 219*  CHOLHDL 5.0   Thyroid Function Tests No results for input(s): TSH, T4TOTAL, T3FREE, THYROIDAB in the last 72 hours.  Invalid input(s): FREET3  Dg Chest 2 View  Result Date: 09/06/2016 CLINICAL DATA:  Acute chest pain and shortness of breath today. EXAM: CHEST  2 VIEW COMPARISON:  12/14/2014 and 06/10/2011 radiographs FINDINGS: The cardiomediastinal silhouette is unremarkable. Mild peribronchial thickening has slightly increased. There is no evidence of focal airspace disease, pulmonary edema, suspicious pulmonary nodule/mass, pleural effusion, or pneumothorax. No acute bony abnormalities are identified. IMPRESSION: Slightly increasing mild peribronchial thickening. No evidence of focal airspace disease. Electronically Signed   By: Margarette Canada M.D.   On: 09/06/2016 17:35   Ct Angio Chest/abd/pel For Dissection W And/or W/wo  Result Date: 09/07/2016 CLINICAL DATA:  35 y/o F; sharp central chest pain with dizziness and shortness of breath. EXAM: CT ANGIOGRAPHY CHEST, ABDOMEN AND PELVIS TECHNIQUE: Multidetector CT imaging through the chest, abdomen and pelvis was performed using the standard protocol during bolus administration of intravenous contrast. Multiplanar reconstructed images and MIPs were obtained and reviewed to evaluate the vascular anatomy. CONTRAST:  200 cc Isovue 370.  A COMPARISON:  None. FINDINGS: CTA CHEST FINDINGS Normal heart size. No pericardial effusion. Mild coronary artery calcifications. Normal size main pulmonary artery. Normal caliber of thoracic aorta. No evidence of dissection, penetrating ulcer, or intramural hematoma of the thoracic aorta. No mediastinal lymphadenopathy.  There are small clusters of nodules within the apical and basal posterior segments of the left lower lobe compatible with early bronchopneumonia. Lungs are otherwise clear. No pleural effusion or pneumothorax. No acute osseous abnormality or suspicious bone lesion is identified. Review of the MIP images confirms the above findings. CTA ABDOMEN AND PELVIS FINDINGS Nonspecific calcifications within the spleen and single calcification within left lobe of liver likely represents sequelae of prior granulomatous disease. The liver is otherwise unremarkable. Normal gallbladder. No biliary ductal dilatation. No appreciable mass or inflammatory changes of the pancreas. Borderline splenomegaly.  Calcified granulomas of spleen. Normal adrenal glands. No focal lesion or inflammatory changes of the kidneys. No kidney stone or obstructive uropathy. The stomach is unremarkable. No obstructive or inflammatory changes of the bowel. Normal appendix. No lymphadenopathy. Normal caliber thoracic abdominal aorta. No evidence of otherwise unremarkable CT of the chest abdomen and pelvis for age. Dissection, penetrating ulcer, aneurysm,  or intramural hematoma. Good opacification of the portal venous system. No suspicious osseous lesion or acute osseous abnormality. Uterus and adnexa are unremarkable. Review of the MIP images confirms the above findings. IMPRESSION: 1. No aneurysm or dissection of the aorta is identified. No acute vascular abnormality. 2. Clusters of nodules in the left lower lobe are probably represent early bronchopneumonia. These results were called by telephone at the time of interpretation on 09/07/2016 at 1:01 am to [facility], who verbally acknowledged these results. Electronically Signed   By: Kristine Garbe M.D.   On: 09/07/2016 01:01    Disposition   Pt is being discharged home today in good condition.  Follow-up Plans & Appointments    Follow-up Information    Cecilie Kicks, NP. Go on 09/16/2016.    Specialties:  Cardiology, Radiology Why:  @ 9:30 for TCM follow up.  Contact information: [address] [place] [address] [phone]        Lindsay Reamer, MD. Schedule an appointment as soon as possible for a visit in 3 day(s).   Specialty:  Internal Medicine Why:  for post hospital follow up for DM and pneumonia Contact information: [place] [address] [phone]          Discharge Instructions    Amb Referral to Cardiac Rehabilitation    Complete by:  As directed    Diagnosis:   NSTEMI PTCA Coronary Stents     Diet - low sodium heart healthy    Complete by:  As directed    Discharge instructions    Complete by:  As directed    NO HEAVY LIFTING (>10lbs) X 2 WEEKS. NO SEXUAL ACTIVITY X 2 WEEKS. NO DRIVING X 1 WEEK. NO SOAKING BATHS, HOT TUBS, POOLS, ETC., X 7 DAYS.  Return to work 09/14/16   Increase activity slowly    Complete by:  As directed       Discharge Medications   Current Discharge Medication List    START taking these medications   Details  atorvastatin (LIPITOR) 80 MG tablet Take 1 tablet (80 mg total) by mouth daily at 6 PM. Qty: 30 tablet, Refills: 6    azithromycin (ZITHROMAX) 250 MG tablet Take 1 tablet (250 mg total) by mouth daily. Starting tomorrow 09/10/16 for 2 days to complete 5 days course. Qty: 2 each, Refills: 0    losartan (COZAAR) 25 MG tablet Take 1 tablet (25 mg total) by mouth daily. Qty: 30 tablet, Refills: 6    metoprolol tartrate (LOPRESSOR) 25 MG tablet Take 1 tablet (25 mg total) by mouth 2 (two) times daily. Qty: 60 tablet, Refills: 6    nitroGLYCERIN (NITROSTAT) 0.4 MG SL tablet Place 1 tablet (0.4 mg total) under the tongue every 5 (five) minutes as needed for chest pain. Qty: 25 tablet, Refills: 12    ticagrelor (BRILINTA) 90 MG TABS tablet Take 1 tablet (90 mg total) by mouth 2 (two) times daily. Qty: 60 tablet, Refills: 11      CONTINUE these medications which have  CHANGED   Details  insulin glargine (LANTUS) 100 unit/mL SOPN Inject 0.4 mLs (40 Units total) into the skin 2 (two) times daily. Qty: 15 pen, Refills: 3   Associated Diagnoses: Other specified diabetes mellitus without complications ([place])      CONTINUE these medications which have NOT CHANGED   Details  ACCU-CHEK SOFTCLIX LANCETS lancets Use as instructed for 4 times daily blood glucose monitoring Qty: 100 each, Refills:  12    albuterol (PROVENTIL HFA;VENTOLIN HFA) 108 (90 Base) MCG/ACT inhaler Inhale 2 puffs into the lungs every 6 (six) hours as needed for wheezing or shortness of breath. Qty: 1 Inhaler, Refills: 2    amLODipine (NORVASC) 10 MG tablet Take 1 tablet (10 mg total) by mouth daily. Qty: 90 tablet, Refills: 3    aspirin 81 MG tablet Take 81 mg by mouth daily.    Blood Glucose Monitoring Suppl (ACCU-CHEK AVIVA PLUS) w/Device KIT Use as directed for 4 times daily blood glucose monitoring Qty: 1 kit, Refills: 0    gabapentin (NEURONTIN) 300 MG capsule Take 1 capsule (300 mg total) by mouth 3 (three) times daily. Qty: 90 capsule, Refills: 2   Associated Diagnoses: Numbness and tingling    !! glucose blood (ACCU-CHEK AVIVA PLUS) test strip Use as instructed for 4 times daily blood glucose monitoring Qty: 100 each, Refills: 12    !! glucose blood (ACCU-CHEK AVIVA) test strip Use as instructed Qty: 100 each, Refills: 12    insulin aspart (NOVOLOG) 100 UNIT/ML FlexPen Inject 10 Units into the skin 3 (three) times daily with meals. Qty: 15 mL, Refills: 3   Associated Diagnoses: Other specified diabetes mellitus without complications (HCC)    Insulin Pen Needle 31G X 5 MM MISC 1 application by Does not apply route Nightly. Qty: 1000 each, Refills: 3    Lancet Devices (ACCU-CHEK SOFTCLIX) lancets Use as instructed for 4 times daily blood glucose monitoring Qty: 1 each, Refills: 0    medroxyPROGESTERone (DEPO-PROVERA) 150 MG/ML injection Inject 150 mg into the muscle every  3 (three) months.    metFORMIN (GLUCOPHAGE) 1000 MG tablet Take 1 tablet (1,000 mg total) by mouth 2 (two) times daily with a meal. Qty: 180 tablet, Refills: 3    ferrous sulfate 325 (65 FE) MG tablet Take 1 tablet (325 mg total) by mouth 2 (two) times daily with a meal. Qty: 60 tablet, Refills: 3     !! - Potential duplicate medications found. Please discuss with provider.    STOP taking these medications     pravastatin (PRAVACHOL) 20 MG tablet          Aspirin prescribed at discharge?  Yes High Intensity Statin Prescribed? (Lipitor 40-18m or Crestor 20-485m: Yes Beta Blocker Prescribed? Yes For EF 45% or less, Was ACEI/ARB Prescribed? Yes ADP Receptor Inhibitor Prescribed? (i.e. Plavix etc.-Includes Medically Managed Patients): Yes For EF <40%, Aldosterone Inhibitor Prescribed? N/A  Was EF assessed during THIS hospitalization? Yes Was Cardiac Rehab II ordered? (Included Medically managed Patients): Yes   Outstanding Labs/Studies   Consider OP f/u labs 6-8 weeks given statin initiation this admission.  Duration of Discharge Encounter   Greater than 30 minutes including physician time.  Signed, Bhagat,Bhavinkumar PA-C 09/09/2016, 10:12 AM   Personally seen and examined. Agree with above.  Ready for DC NSTEMI Cardiac rehab Weight loss FiElizebeth Kollerut short course of ABX Expressed importance of DAPT Cath site normal. Lungs clear, heart reg.   MaCandee FurbishMD

## 2016-09-09 NOTE — Progress Notes (Signed)
Inpatient Diabetes Program Recommendations  AACE/ADA: New Consensus Statement on Inpatient Glycemic Control (2015)  Target Ranges:  Prepandial:   less than 140 mg/dL      Peak postprandial:   less than 180 mg/dL (1-2 hours)      Critically ill patients:  140 - 180 mg/dL   Review of Glycemic Control  Diabetes history: DM 2 diagnosed 13 years ago Outpatient Diabetes medications: Lantus 65 QHS, Novolog 20 units TID with meals, Metformin 1,000 mg BID  Inpatient Diabetes Program Recommendations:   Spoke with patient about diabetes and home regimen for diabetes control. Patient reports that [facility]. Patient reports she use to go and see Dr. Kumar 4 years ago and was better controlled at that time. Patient reports being unable to afford her medication due to her trying to pay her bills instead. Over the past 3 months she was not able to take her insulin half of the time. Discussed A1C results (12.4% on 09/08/16). Discussed glucose and A1C goals. Patient admits knowing what and how to control her glucose levels. She speaks of difficulty due to her having an 64 year old daughter and trying to be compliant with diet. Patient has glucose meter and supplies at home. Explained how hyperglycemia leads to damage within blood vessels which lead to the common complications seen with uncontrolled diabetes. Stressed to the patient the importance of improving glycemic control to prevent further complications from uncontrolled diabetes. Discussed impact of nutrition, exercise, stress, sickness, and medications on diabetes control.Discussed carbohydrates, carbohydrate goals per day and meal, along with portion sizes. Discussed with patient about counseling and speaking with her daughter about having healthy habits due to Diabetes running in their family. Patient verbalized understanding of information discussed and  has no questions at this time related to  diabetes.  Thanks, Christena Deem RN, MSN, [facility] Inpatient Diabetes Coordinator Team Pager [phone] (8a-5p)

## 2016-09-09 NOTE — Progress Notes (Signed)
Consult PROGRESS NOTE                                                                                                                                                                                                             Patient Demographics:    Lindsay [name], is a 35 y.o. female, DOB - 1981/07/10, UVO:[id number]  Admit date - 09/06/2016   Admitting Physician Jackie Plum, MD  Outpatient Primary MD for the patient is Pete Glatter, MD  LOS - 2  Chief Complaint  Patient presents with  . Chest Pain       Brief Narrative   Lindsay [name] is a 35 y.o. female with a Past Medical History of diabetes, morbid obesity, hypertension, asthma, obesity hypoventilation syndrome who presents with NSTEMI status post cardiac catheterization this admission by cardiology with stent placement. Patient is very high risk for continued physical deterioration given her very poorly controlled diabetes, obesity, and active CAD. Of note titration's in her Lantus and NovoLog will be made at this time to begin attempts to improve overall glucose levels. Patient's last A1c was 13.   Subjective:    Lindsay [name] today has, No headache, No chest pain, No abdominal pain - No Nausea, No new weakness tingling or numbness, No Cough - SOB.     Assessment  & Plan :     1. NSTEMI - Status post PCI, defer management to primary team which is cardiology.  2. URI. Chest x-ray is not impressive, symptoms not impressive, doubt this is pneumonia, taper down to oral Levaquin to be dosed by pharmacy will give her 3 more doses including the one today and then stop.  3. DM type II poorly controlled. A1c 13, question compliance, sugars much better on Lantus 40 units subcutaneous twice a day, can continue NovoLog per home dose. Key is to request the patient to be compliant with her regimen and to do Accu-Cheks every before meals at bedtime,  long the results in a log book and showed to PCP within 3-4 days.  4. Morbid obesity. Follow with PCP for weight loss.   5. Dyslipidemia. On statin.  6. Essential hypertension. Stable on  combination of Norvasc, beta blocker and ARB.    DVT Prophylaxis  :  Lovenox    Lab Results  Component Value Date   PLT 208 09/09/2016    Inpatient Medications  Scheduled Meds: . amLODipine  10 mg Oral Daily  . aspirin  81 mg Oral Daily  . atorvastatin  80 mg Oral q1800  . enoxaparin (LOVENOX) injection  40 mg Subcutaneous Q24H  . gabapentin  300 mg Oral TID  . insulin aspart  0-20 Units Subcutaneous TID WC  . insulin glargine  40 Units Subcutaneous BID  . losartan  25 mg Oral Daily  . metoprolol tartrate  25 mg Oral BID  . sodium chloride flush  3 mL Intravenous Q12H  . ticagrelor  90 mg Oral BID   Continuous Infusions:  PRN Meds:.sodium chloride, acetaminophen, morphine injection, ondansetron (ZOFRAN) IV, oxyCODONE-acetaminophen, sodium chloride flush  Antibiotics  :    Anti-infectives    Start     Dose/Rate Route Frequency Ordered Stop   09/07/16 0400  azithromycin (ZITHROMAX) 500 mg in dextrose 5 % 250 mL IVPB  Status:  Discontinued     500 mg 250 mL/hr over 60 Minutes Intravenous Daily 09/07/16 0349 09/09/16 0959   09/07/16 0315  doxycycline (VIBRA-TABS) tablet 100 mg  Status:  Discontinued     100 mg Oral Every 12 hours 09/07/16 0304 09/07/16 0347   09/07/16 0230  cefTRIAXone (ROCEPHIN) 1 g in dextrose 5 % 50 mL IVPB  Status:  Discontinued     1 g 100 mL/hr over 30 Minutes Intravenous Daily at bedtime 09/07/16 0221 09/09/16 0959         Objective:   Vitals:   09/08/16 1527 09/08/16 2011 09/08/16 2259 09/09/16 0500  BP: 130/79 124/78  128/75  Pulse: 90 90    Resp:  20 (!) 25 20  Temp: 98.5 F (36.9 C) 98.9 F (37.2 C)  98.8 F (37.1 C)  TempSrc: Oral Oral  Oral  SpO2: 99% 100%  95%  Weight:      Height:        Wt Readings from Last 3 Encounters:  09/07/16 (!)  166.8 kg (367 lb 11.6 oz)  07/28/16 (!) 167.4 kg (369 lb)  06/10/16 (!) 163.7 kg (361 lb)     Intake/Output Summary (Last 24 hours) at 09/09/16 1002 Last data filed at 09/09/16 0555  Gross per 24 hour  Intake              670 ml  Output                0 ml  Net              670 ml     Physical Exam  Awake Alert, Oriented X 3, No new F.N deficits, Normal affect [place].AT,PERRAL Supple Neck,No JVD, No cervical lymphadenopathy appriciated.  Symmetrical Chest wall movement, Good air movement bilaterally, CTAB RRR,No Gallops,Rubs or new Murmurs, No Parasternal Heave +ve B.Sounds, Abd Soft, No tenderness, No organomegaly appriciated, No rebound - guarding or rigidity. No Cyanosis, Clubbing or edema, No new Rash or bruise       Data Review:    CBC  Recent Labs Lab 09/06/16 1620 09/07/16 0351 09/07/16 1213 09/08/16 0234 09/09/16 0501  WBC 7.3 8.2 7.6 8.2 7.0  HGB 11.8* 12.1 11.2* 11.4* 10.3*  HCT 36.3 38.0 34.7* 36.1 32.8*  PLT 208 233 208 216 208  MCV 79.8 80.3 80.3 81.7 81.6  MCH 25.9* 25.6* 25.9*  25.8* 25.6*  MCHC 32.5 31.8 32.3 31.6 31.4  RDW 13.7 13.9 13.9 13.9 14.0    Chemistries   Recent Labs Lab 09/06/16 1620 09/07/16 1213 09/08/16 0234  NA 135  --  134*  K 4.3  --  4.0  CL 103  --  103  CO2 25  --  22  GLUCOSE 401*  --  284*  BUN 14  --  11  CREATININE 0.77 0.71 0.83  CALCIUM 8.9  --  8.6*   ------------------------------------------------------------------------------------------------------------------  Recent Labs  09/07/16 0405  CHOL 201*  HDL 40*  LDLCALC 117*  TRIG 219*  CHOLHDL 5.0    Lab Results  Component Value Date   HGBA1C 12.4 (H) 09/08/2016   ------------------------------------------------------------------------------------------------------------------ No results for input(s): TSH, T4TOTAL, T3FREE, THYROIDAB in the last 72 hours.  Invalid input(s):  FREET3 ------------------------------------------------------------------------------------------------------------------ No results for input(s): VITAMINB12, FOLATE, FERRITIN, TIBC, IRON, RETICCTPCT in the last 72 hours.  Coagulation profile  Recent Labs Lab 09/07/16 0351  INR 1.01     Recent Labs  09/06/16 1815  DDIMER <0.27    Cardiac Enzymes  Recent Labs Lab 09/06/16 2217 09/07/16 0351 09/08/16 0234  TROPONINI 1.65* 4.74* 6.64*   ------------------------------------------------------------------------------------------------------------------ No results found for: BNP  Micro Results Recent Results (from the past 240 hour(s))  MRSA PCR Screening     Status: None   Collection Time: 09/07/16  3:27 AM  Result Value Ref Range Status   MRSA by PCR NEGATIVE NEGATIVE Final    Comment:        The GeneXpert MRSA Assay (FDA approved for NASAL specimens only), is one component of a comprehensive MRSA colonization surveillance program. It is not intended to diagnose MRSA infection nor to guide or monitor treatment for MRSA infections.     Radiology Reports Dg Chest 2 View  Result Date: 09/06/2016 CLINICAL DATA:  Acute chest pain and shortness of breath today. EXAM: CHEST  2 VIEW COMPARISON:  12/14/2014 and 06/10/2011 radiographs FINDINGS: The cardiomediastinal silhouette is unremarkable. Mild peribronchial thickening has slightly increased. There is no evidence of focal airspace disease, pulmonary edema, suspicious pulmonary nodule/mass, pleural effusion, or pneumothorax. No acute bony abnormalities are identified. IMPRESSION: Slightly increasing mild peribronchial thickening. No evidence of focal airspace disease. Electronically Signed   By: Harmon Pier M.D.   On: 09/06/2016 17:35   Ct Angio Chest/abd/pel For Dissection W And/or W/wo  Result Date: 09/07/2016 CLINICAL DATA:  35 y/o F; sharp central chest pain with dizziness and shortness of breath. EXAM: CT ANGIOGRAPHY  CHEST, ABDOMEN AND PELVIS TECHNIQUE: Multidetector CT imaging through the chest, abdomen and pelvis was performed using the standard protocol during bolus administration of intravenous contrast. Multiplanar reconstructed images and MIPs were obtained and reviewed to evaluate the vascular anatomy. CONTRAST:  200 cc Isovue 370.  A COMPARISON:  None. FINDINGS: CTA CHEST FINDINGS Normal heart size. No pericardial effusion. Mild coronary artery calcifications. Normal size main pulmonary artery. Normal caliber of thoracic aorta. No evidence of dissection, penetrating ulcer, or intramural hematoma of the thoracic aorta. No mediastinal lymphadenopathy. There are small clusters of nodules within the apical and basal posterior segments of the left lower lobe compatible with early bronchopneumonia. Lungs are otherwise clear. No pleural effusion or pneumothorax. No acute osseous abnormality or suspicious bone lesion is identified. Review of the MIP images confirms the above findings. CTA ABDOMEN AND PELVIS FINDINGS Nonspecific calcifications within the spleen and single calcification within left lobe of liver likely represents sequelae of prior granulomatous disease.  The liver is otherwise unremarkable. Normal gallbladder. No biliary ductal dilatation. No appreciable mass or inflammatory changes of the pancreas. Borderline splenomegaly.  Calcified granulomas of spleen. Normal adrenal glands. No focal lesion or inflammatory changes of the kidneys. No kidney stone or obstructive uropathy. The stomach is unremarkable. No obstructive or inflammatory changes of the bowel. Normal appendix. No lymphadenopathy. Normal caliber thoracic abdominal aorta. No evidence of otherwise unremarkable CT of the chest abdomen and pelvis for age. Dissection, penetrating ulcer, aneurysm, or intramural hematoma. Good opacification of the portal venous system. No suspicious osseous lesion or acute osseous abnormality. Uterus and adnexa are unremarkable.  Review of the MIP images confirms the above findings. IMPRESSION: 1. No aneurysm or dissection of the aorta is identified. No acute vascular abnormality. 2. Clusters of nodules in the left lower lobe are probably represent early bronchopneumonia. These results were called by telephone at the time of interpretation on 09/07/2016 at 1:01 am to [facility], who verbally acknowledged these results. Electronically Signed   By: Mitzi Hansen M.D.   On: 09/07/2016 01:01    Time Spent in minutes  30   Mykah Shin K M.D on 09/09/2016 at 10:02 AM  Between 7am to 7pm - Pager - [phone]  After 7pm go to [email or web address] - password [facility]  [facility] -  Office  [phone]

## 2016-09-10 NOTE — Telephone Encounter (Signed)
LMTCB

## 2016-09-11 DIAGNOSIS — I25119 Atherosclerotic heart disease of native coronary artery with unspecified angina pectoris: Secondary | ICD-10-CM

## 2016-09-11 NOTE — Telephone Encounter (Signed)
Patient contacted regarding discharge from [facility] on 09/09/2016.  Patient understands to follow up with provider Nada Boozer NP on 09/16/2016 at 9:30 AM at [facility] office. Patient understands discharge instructions? yes Patient understands medications and regiment? yes Patient understands to bring all medications to this visit? yes

## 2016-09-15 ENCOUNTER — Encounter: Payer: Self-pay | Admitting: Cardiology

## 2016-09-15 NOTE — Progress Notes (Signed)
Cardiology Office Note   Date:  09/16/2016   ID:  Lindsay [name], DOB 1981-04-04, MRN [id number]  PCP:  [name], MD  Cardiologist:  Dr. [name]    Chief Complaint  Patient presents with  . Establish Care    f/u to hsp visit  . Hospitalization Follow-up    NSTEMI      History of Present Illness: Lindsay [name] is a 35 y.o. female who presents for hospital follow up for NSTEMI, with pk troponin 6.64 with cath and PCI with DES to mLAD and PTCA only to 2nd diag ostium. Still with 50% residual stenosis.  EF with cath 35-45%but Echo was 55-60%.  On ASA, Brilinta, BB ARB and Statin,   Other hx HTN, hyperlipidemia, uncontrolled DM with A1C of 13. meds adjusted. - also with CAP/URI and treated with abx. Also with anemia, also with morbid obesity.   Today she is doing well.  Occ brief stinging chest discomfort, last a sec.  Different than original pain.  No SOB.  Her glucose is improved down to 200 and today 177.  She is walking 15 min 3 X a week.  She is eating healthier.  She hopes to be able to attend cardiac rehab.  She has not missed any brilinta or ASA.    Her symptoms of CAP have resolved and she has completed ABX.  PNA seen on CT scan of chest and lymph nodes felt to be due to infection.    Past Medical History:  Diagnosis Date  . ARF (acute renal failure) ([place]) 04/2015  . Asthma   . Cellulitis of right upper extremity   . Coronary artery disease   . Diabetes mellitus    insulin dependent  . Hyperlipidemia LDL goal <70   . Hypertension   . NSTEMI (non-ST elevated myocardial infarction) ([place]) 08/2016  . Obesity   . S/P angioplasty with stent 08/2016   DES to mLAD and PTCA only to 2nd diag ostium.     Past Surgical History:  Procedure Laterality Date  . CARDIAC CATHETERIZATION N/A 09/07/2016   Procedure: Left Heart Cath and Coronary Angiography;  Surgeon: [name], MD;  Location: [place] [facility];  Service: Cardiovascular;  Laterality: N/A;  . CARDIAC  CATHETERIZATION N/A 09/07/2016   Procedure: Coronary Stent Intervention;  Surgeon: [name], MD;  Location: [place] [facility];  Service: Cardiovascular;  Laterality: N/A;  . CARDIAC CATHETERIZATION N/A 09/07/2016   Procedure: Coronary Balloon Angioplasty;  Surgeon: [name], MD;  Location: [place] [facility];  Service: Cardiovascular;  Laterality: N/A;  . CESAREAN SECTION    . IRRIGATION AND DEBRIDEMENT SHOULDER Right 04/30/2015   Procedure: IRRIGATION AND DEBRIDEMENT SHOULDER;  Surgeon: Renette Butters, MD;  Location: [place];  Service: Orthopedics;  Laterality: Right;  . IRRIGATION AND DEBRIDEMENT SHOULDER Right 05/01/2015  . LEG SURGERY    . SHOULDER ARTHROSCOPY Right 04/30/2015   Procedure: ARTHROSCOPY SHOULDER;  Surgeon: Renette Butters, MD;  Location: [place];  Service: Orthopedics;  Laterality: Right;  . TONSILLECTOMY       Current Outpatient Prescriptions  Medication Sig Dispense Refill  . ACCU-CHEK SOFTCLIX LANCETS lancets Use as instructed for 4 times daily blood glucose monitoring 100 each 12  . albuterol (PROVENTIL HFA;VENTOLIN HFA) 108 (90 Base) MCG/ACT inhaler Inhale 2 puffs into the lungs every 6 (six) hours as needed for wheezing or shortness of breath. 1 Inhaler 2  . amLODipine (NORVASC) 10 MG tablet Take  1 tablet (10 mg total) by mouth daily. 90 tablet 3  . aspirin 81 MG tablet Take 81 mg by mouth daily.    Marland Kitchen atorvastatin (LIPITOR) 80 MG tablet Take 1 tablet (80 mg total) by mouth daily at 6 PM. 30 tablet 6  . Blood Glucose Monitoring Suppl (ACCU-CHEK AVIVA PLUS) w/Device KIT Use as directed for 4 times daily blood glucose monitoring 1 kit 0  . ferrous sulfate 325 (65 FE) MG tablet Take 1 tablet (325 mg total) by mouth 2 (two) times daily with a meal. 60 tablet 3  . gabapentin (NEURONTIN) 300 MG capsule Take 1 capsule (300 mg total) by mouth 3 (three) times daily. 90 capsule 2  . glucose blood (ACCU-CHEK AVIVA PLUS) test strip Use as instructed for 4 times daily blood  glucose monitoring 100 each 12  . glucose blood (ACCU-CHEK AVIVA) test strip Use as instructed 100 each 12  . insulin aspart (NOVOLOG) 100 UNIT/ML FlexPen Inject 10 Units into the skin 3 (three) times daily with meals. (Patient taking differently: Inject 20 Units into the skin 3 (three) times daily with meals. ) 15 mL 3  . insulin glargine (LANTUS) 100 unit/mL SOPN Inject 0.4 mLs (40 Units total) into the skin 2 (two) times daily. 15 pen 3  . Insulin Pen Needle 31G X 5 MM MISC 1 application by Does not apply route Nightly. 1000 each 3  . Lancet Devices (ACCU-CHEK SOFTCLIX) lancets Use as instructed for 4 times daily blood glucose monitoring 1 each 0  . losartan (COZAAR) 25 MG tablet Take 1 tablet (25 mg total) by mouth daily. 30 tablet 6  . medroxyPROGESTERone (DEPO-PROVERA) 150 MG/ML injection Inject 150 mg into the muscle every 3 (three) months.    . metFORMIN (GLUCOPHAGE) 1000 MG tablet Take 1 tablet (1,000 mg total) by mouth 2 (two) times daily with a meal. 180 tablet 3  . metoprolol tartrate (LOPRESSOR) 25 MG tablet Take 1 tablet (25 mg total) by mouth 2 (two) times daily. 60 tablet 6  . nitroGLYCERIN (NITROSTAT) 0.4 MG SL tablet Place 1 tablet (0.4 mg total) under the tongue every 5 (five) minutes as needed for chest pain. 25 tablet 12  . ticagrelor (BRILINTA) 90 MG TABS tablet Take 1 tablet (90 mg total) by mouth 2 (two) times daily. 60 tablet 11   No current facility-administered medications for this visit.     Allergies:   Hydrazine yellow [tartrazine]; Lisinopril; and Tylenol [acetaminophen]    Social History:  The patient  reports that she has never smoked. She has never used smokeless tobacco. She reports that she drinks alcohol. She reports that she does not use drugs.   Family History:  The patient's family history includes Cancer in her maternal grandmother; Diabetes in her father and mother; Heart disease in her father and mother; Hypertension in her mother; Stroke in her  maternal grandmother.    ROS:  General:no colds or fevers, no weight changes Skin:no rashes or ulcers HEENT:no blurred vision, no congestion currently CV:see HPI PUL:see HPI GI:no diarrhea constipation or melena, no indigestion GU:no hematuria, no dysuria MS:no joint pain, no claudication Neuro:no syncope, no lightheadedness Endo:+ diabetes see above, no thyroid disease GYN: denies preg and she is on dep injections  Wt Readings from Last 3 Encounters:  09/16/16 (!) 368 lb (166.9 kg)  09/07/16 (!) 367 lb 11.6 oz (166.8 kg)  07/28/16 (!) 369 lb (167.4 kg)     PHYSICAL EXAM: VS:  BP 120/80   Pulse  85   Ht 5' 8" (1.727 m)   Wt (!) 368 lb (166.9 kg)   LMP 08/04/2016   SpO2 98%   BMI 55.95 kg/m  , BMI Body mass index is 55.95 kg/m. General:Pleasant affect, NAD Skin:Warm and dry, brisk capillary refill HEENT:normocephalic, sclera clear, mucus membranes moist Neck:supple, no JVD, no bruits  Heart:S1S2 RRR without murmur, gallup, rub or click Lungs:clear without rales, rhonchi, or wheezes PZW:CHEN, non tender, + BS, do not palpate liver spleen or masses Ext:no lower ext edema, 2+ pedal pulses, 2+ radial pulses Neuro:alert and oriented, MAE, follows commands, + facial symmetry    EKG:  EKG is ordered today. The ekg ordered today demonstrates SR at 79 ST changes resolved. Stable EKG.    Recent Labs: 09/08/2016: BUN 11; Creatinine, Ser 0.83; Potassium 4.0; Sodium 134 09/09/2016: Hemoglobin 10.3; Platelets 208    Lipid Panel    Component Value Date/Time   CHOL 201 (H) 09/07/2016 0405   TRIG 219 (H) 09/07/2016 0405   HDL 40 (L) 09/07/2016 0405   CHOLHDL 5.0 09/07/2016 0405   VLDL 44 (H) 09/07/2016 0405   LDLCALC 117 (H) 09/07/2016 0405       Other studies Reviewed: Additional studies/ records that were reviewed today include: . Cardiac cath 09/07/16 Conclusion     The left ventricular ejection fraction is 35-45% by visual estimate.  LV end diastolic pressure  is moderately elevated.  There is no mitral valve regurgitation.    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 This is a culprit for the patient's presentation and elevated troponins.  Successful PCI and stent implantation reducing the 100% stenosis to 0% with TIMI grade 3 flow. Final postdilatation diameter 3.5 mm. Xience Alpine 3.0 x 18 mm DES was used.  Successful PTCA of second diagonal ostium from 75% to 50% pre-stenting.  25-40% stenosis in the proximal circumflex, and 50% segmental stenosis in the proximal first obtuse marginal.  Anteroapical akinesis. LVEDP 26 mmHg. Estimated EF 35-45%.  Recommendations:   Aggressive risk factor modification including high-intensity statin therapy, aggressive glycemic control, weight reduction, blood pressure control, low-fat carbohydrate modified diet, and exercise.  Eligible for discharge in 24 hours assuming no complications.  IV heparin was discontinued.  Aspirin and Brilinta 6-12 months.  Beta blocker therapy and ACE/ARB to treat what I believe will be reversible LV systolic dysfunction.    ECHO: Study Conclusions  - Left ventricle: The cavity size was normal. There was mild   concentric hypertrophy. Systolic function was normal. The   estimated ejection fraction was in the range of 55% to 60%. Mild   hypokinesis of the anteroseptal, anterior, and apical myocardium.   Left ventricular diastolic function parameters were normal. - Mitral valve: Transvalvular velocity was within the normal range.   There was no evidence for stenosis. There was trivial   regurgitation. - Right ventricle: The cavity size was normal. Wall thickness was   normal. Systolic function was normal.  CT angio of chest  1. No aneurysm or dissection of the aorta is identified. No acute vascular abnormality. 2. Clusters of nodules in the left lower lobe are probably represent early  bronchopneumonia ASSESSMENT AND PLAN:  1. S/p NSTEMI with PCI on ASA, Brilinta her Medicaid pays for the Brilinta, plan for cardiac rehab.,  Follow up 3 months with Dr. [name] but to call if problems.  2. CAD residual disease on statin, control DM, on BB, ARB  3. ICM improved at discharge on ARB  4. Hyperlipidemia continue statin check lipids in 6 weeks.and hepatic  5. CAP has completed ABX and symptoms resolved, Her PCP will follow.  CT scan felt pulmonary nodules were due to PNA.  6.  DM improved, she is eating better and watching her glucose, to see PCP in Oct.  7. Pulmonary nodules see above with CAP  8.  Anemia recheck her CBC  9.  Hx of acute renal failure, will recheck BMP with her Hx , cath and now on ARB.    Current medicines are reviewed with the patient today.  The patient Has no concerns regarding medicines.  The following changes have been made:  See above Labs/ tests ordered today include:see above  Disposition:   FU:  see above  Signed, Cecilie Kicks, NP  09/16/2016 10:19 AM    [address], [place], [place], [place] Phone: [phone]; Fax: [phone]

## 2016-09-16 ENCOUNTER — Ambulatory Visit (INDEPENDENT_AMBULATORY_CARE_PROVIDER_SITE_OTHER): Payer: Medicaid Other | Admitting: Cardiology

## 2016-09-16 ENCOUNTER — Encounter: Payer: Self-pay | Admitting: Cardiology

## 2016-09-16 VITALS — BP 120/80 | HR 85 | Ht 68.0 in | Wt 368.0 lb

## 2016-09-16 DIAGNOSIS — Z7189 Other specified counseling: Secondary | ICD-10-CM

## 2016-09-16 DIAGNOSIS — I214 Non-ST elevation (NSTEMI) myocardial infarction: Secondary | ICD-10-CM

## 2016-09-16 DIAGNOSIS — E785 Hyperlipidemia, unspecified: Secondary | ICD-10-CM

## 2016-09-16 DIAGNOSIS — E1122 Type 2 diabetes mellitus with diabetic chronic kidney disease: Secondary | ICD-10-CM

## 2016-09-16 DIAGNOSIS — D649 Anemia, unspecified: Secondary | ICD-10-CM

## 2016-09-16 DIAGNOSIS — Z7689 Persons encountering health services in other specified circumstances: Secondary | ICD-10-CM

## 2016-09-16 DIAGNOSIS — I2510 Atherosclerotic heart disease of native coronary artery without angina pectoris: Secondary | ICD-10-CM

## 2016-09-16 DIAGNOSIS — N181 Chronic kidney disease, stage 1: Secondary | ICD-10-CM

## 2016-09-16 LAB — BASIC METABOLIC PANEL
BUN: 16 mg/dL (ref 7–25)
CHLORIDE: 107 mmol/L (ref 98–110)
CO2: 19 mmol/L — ABNORMAL LOW (ref 20–31)
CREATININE: 0.71 mg/dL (ref 0.50–1.10)
Calcium: 9.1 mg/dL (ref 8.6–10.2)
Glucose, Bld: 223 mg/dL — ABNORMAL HIGH (ref 65–99)
POTASSIUM: 4.5 mmol/L (ref 3.5–5.3)
Sodium: 137 mmol/L (ref 135–146)

## 2016-09-16 LAB — CBC
HEMATOCRIT: 34.5 % — AB (ref 35.0–45.0)
Hemoglobin: 11.2 g/dL — ABNORMAL LOW (ref 11.7–15.5)
MCH: 25.5 pg — ABNORMAL LOW (ref 27.0–33.0)
MCHC: 32.5 g/dL (ref 32.0–36.0)
MCV: 78.4 fL — ABNORMAL LOW (ref 80.0–100.0)
MPV: 10.7 fL (ref 7.5–12.5)
PLATELETS: 313 10*3/uL (ref 140–400)
RBC: 4.4 MIL/uL (ref 3.80–5.10)
RDW: 14.3 % (ref 11.0–15.0)
WBC: 6.7 10*3/uL (ref 3.8–10.8)

## 2016-09-16 NOTE — Patient Instructions (Addendum)
Medication Instructions:   Your physician recommends that you continue on your current medications as directed. Please refer to the Current Medication list given to you today.   If you need a refill on your cardiac medications before your next appointment, please call your pharmacy.  Labwork: CBC AND BMP TODAY   Return in 6 to 8 weeks for  FASTING LFT AND LIPIDS   Testing/Procedures: NONE ORDER TODAY    Follow-Up: 3 MONTHS WITH DR Katrinka Blazing    Any Other Special Instructions Will Be Listed Below (If Applicable).

## 2016-09-17 ENCOUNTER — Ambulatory Visit (HOSPITAL_BASED_OUTPATIENT_CLINIC_OR_DEPARTMENT_OTHER): Payer: Medicaid Other | Attending: Internal Medicine | Admitting: Internal Medicine

## 2016-09-17 VITALS — Ht 68.0 in | Wt 368.0 lb

## 2016-09-17 DIAGNOSIS — G4719 Other hypersomnia: Secondary | ICD-10-CM | POA: Diagnosis not present

## 2016-09-17 DIAGNOSIS — R400 Somnolence: Secondary | ICD-10-CM | POA: Insufficient documentation

## 2016-09-17 DIAGNOSIS — G4733 Obstructive sleep apnea (adult) (pediatric): Secondary | ICD-10-CM | POA: Diagnosis not present

## 2016-09-17 DIAGNOSIS — Z794 Long term (current) use of insulin: Secondary | ICD-10-CM | POA: Diagnosis not present

## 2016-09-17 DIAGNOSIS — R0683 Snoring: Secondary | ICD-10-CM | POA: Insufficient documentation

## 2016-09-17 DIAGNOSIS — Z79899 Other long term (current) drug therapy: Secondary | ICD-10-CM | POA: Diagnosis not present

## 2016-09-27 DIAGNOSIS — R400 Somnolence: Secondary | ICD-10-CM | POA: Diagnosis not present

## 2016-09-27 NOTE — Procedures (Signed)
  Patient Name: Lindsay [name], Lindsay [name] Study Date: 09/17/2016 Gender: Female D.O.B: 04-10-1981 Age (years): 35 Referring Provider: Pete Glatter Height (inches): 68 Interpreting Physician: Jetty Duhamel MD, ABSM Weight (lbs): 368 RPSGT: Elaina Pattee BMI: 56 MRN: [id number] Neck Size: 17.50 CLINICAL INFORMATION Sleep Study Type: NPSG Indication for sleep study: Excessive Daytime Sleepiness Epworth Sleepiness Score: 4 SLEEP STUDY TECHNIQUE As per the AASM Manual for the Scoring of Sleep and Associated Events v2.3 (April 2016) with a hypopnea requiring 4% desaturations. The channels recorded and monitored were frontal, central and occipital EEG, electrooculogram (EOG), submentalis EMG (chin), nasal and oral airflow, thoracic and abdominal wall motion, anterior tibialis EMG, snore microphone, electrocardiogram, and pulse oximetry.  MEDICATIONS Patient's medications include: charted for review Medications self-administered by patient during sleep study :  IBUPROFEN, GABAPENTIN, LANTUS.  SLEEP ARCHITECTURE The study was initiated at 11:19:09 PM and ended at 5:21:27 AM. Sleep onset time was 13.4 minutes and the sleep efficiency was 55.2%. The total sleep time was 200.0 minutes. Stage REM latency was 49.5 minutes. The patient spent 2.50% of the night in stage N1 sleep, 80.00% in stage N2 sleep, 0.00% in stage N3 and 17.50% in REM. Alpha intrusion was absent. Supine sleep was 39.25%. Wake after sleep onset 149 minutes  RESPIRATORY PARAMETERS The overall apnea/hypopnea index (AHI) was 8.4 per hour. There were 2 total apneas, including 0 obstructive, 1 central and 1 mixed apneas. There were 26 hypopneas and 29 RERAs. The AHI during Stage REM sleep was 27.4 per hour. AHI while supine was 14.5 per hour. The mean oxygen saturation was 97.09%. The minimum SpO2 during sleep was 84.00%. Moderate snoring was noted during this study.  CARDIAC DATA The 2 lead EKG demonstrated sinus rhythm.  The mean heart rate was 87.54 beats per minute. Other EKG findings include: None.  LEG MOVEMENT DATA The total PLMS were 0 with a resulting PLMS index of 0.00. Associated arousal with leg movement index was 0.0 .  IMPRESSIONS - Mild obstructive sleep apnea occurred during this study (AHI = 8.4/h). - Insufficient early events to meet protocol requirements for split CPAP titration - No significant central sleep apnea occurred during this study (CAI = 0.3/h). - Mild oxygen desaturation was noted during this study (Min O2 = 84.00%). - The patient snored with Moderate snoring volume. - No cardiac abnormalities were noted during this study. - Clinically significant periodic limb movements did not occur during sleep. No significant associated arousals.  DIAGNOSIS - Obstructive Sleep Apnea (327.23 [G47.33 ICD-10])  RECOMMENDATIONS - CPAP titration or a fitted oral appliance may be options if conservative measures including weight loss are insufficient. - Positional therapy avoiding supine position during sleep. - Avoid alcohol, sedatives and other CNS depressants that may worsen sleep apnea and disrupt normal sleep architecture. - Sleep hygiene should be reviewed to assess factors that may improve sleep quality. - Weight management and regular exercise should be initiated or continued if appropriate.  [Electronically signed] 09/27/2016 09:42 AM  Jetty Duhamel MD, ABSM Diplomate, American Board of Sleep Medicine   NPI: [id number]  Waymon Budge Diplomate, American Board of Sleep Medicine  ELECTRONICALLY SIGNED ON:  09/27/2016, 9:37 AM [facility] PH: [phone]   FX: [phone] ACCREDITED BY THE AMERICAN ACADEMY OF SLEEP MEDICINE

## 2016-09-28 ENCOUNTER — Ambulatory Visit: Payer: Medicaid Other | Attending: Internal Medicine | Admitting: Internal Medicine

## 2016-09-28 ENCOUNTER — Encounter: Payer: Self-pay | Admitting: Internal Medicine

## 2016-09-28 VITALS — BP 143/92 | HR 88 | Temp 98.6°F | Resp 16 | Wt 381.4 lb

## 2016-09-28 DIAGNOSIS — I252 Old myocardial infarction: Secondary | ICD-10-CM | POA: Diagnosis not present

## 2016-09-28 DIAGNOSIS — E785 Hyperlipidemia, unspecified: Secondary | ICD-10-CM | POA: Diagnosis not present

## 2016-09-28 DIAGNOSIS — R200 Anesthesia of skin: Secondary | ICD-10-CM

## 2016-09-28 DIAGNOSIS — Z886 Allergy status to analgesic agent status: Secondary | ICD-10-CM | POA: Insufficient documentation

## 2016-09-28 DIAGNOSIS — I214 Non-ST elevation (NSTEMI) myocardial infarction: Secondary | ICD-10-CM

## 2016-09-28 DIAGNOSIS — Z9111 Patient's noncompliance with dietary regimen: Secondary | ICD-10-CM | POA: Insufficient documentation

## 2016-09-28 DIAGNOSIS — Z7982 Long term (current) use of aspirin: Secondary | ICD-10-CM | POA: Diagnosis not present

## 2016-09-28 DIAGNOSIS — Z888 Allergy status to other drugs, medicaments and biological substances status: Secondary | ICD-10-CM | POA: Insufficient documentation

## 2016-09-28 DIAGNOSIS — Z955 Presence of coronary angioplasty implant and graft: Secondary | ICD-10-CM | POA: Diagnosis not present

## 2016-09-28 DIAGNOSIS — Z9109 Other allergy status, other than to drugs and biological substances: Secondary | ICD-10-CM | POA: Insufficient documentation

## 2016-09-28 DIAGNOSIS — G4733 Obstructive sleep apnea (adult) (pediatric): Secondary | ICD-10-CM

## 2016-09-28 DIAGNOSIS — E1140 Type 2 diabetes mellitus with diabetic neuropathy, unspecified: Secondary | ICD-10-CM | POA: Diagnosis not present

## 2016-09-28 DIAGNOSIS — R202 Paresthesia of skin: Secondary | ICD-10-CM | POA: Diagnosis not present

## 2016-09-28 DIAGNOSIS — Z6841 Body Mass Index (BMI) 40.0 and over, adult: Secondary | ICD-10-CM | POA: Diagnosis not present

## 2016-09-28 DIAGNOSIS — I2510 Atherosclerotic heart disease of native coronary artery without angina pectoris: Secondary | ICD-10-CM | POA: Diagnosis not present

## 2016-09-28 DIAGNOSIS — I10 Essential (primary) hypertension: Secondary | ICD-10-CM | POA: Diagnosis not present

## 2016-09-28 DIAGNOSIS — Z794 Long term (current) use of insulin: Secondary | ICD-10-CM

## 2016-09-28 LAB — GLUCOSE, POCT (MANUAL RESULT ENTRY): POC GLUCOSE: 236 mg/dL — AB (ref 70–99)

## 2016-09-28 MED ORDER — GABAPENTIN 300 MG PO CAPS: 300.0000 mg | ORAL_CAPSULE | Freq: Four times a day (QID) | ORAL | 2 refills | Status: DC

## 2016-09-28 NOTE — Patient Instructions (Signed)
QUICK START PATIENT GUIDE TO LCHF/IF LOW CARB HIGH FAT / INTERMITTENT FASTING What is this diet and how does it work? o Insulin is a hormone made by your body that allows you to use sugar (glucose) from carbohydrates in the food you eat for energy or to store glucose (as fat) for future use  o Insulin levels need to be lowered in order to utilize our stored energy (fat) o Many struggling with obesity are insulin resistant and have high levels of insulin o This diet works to lower your insulin in two ways o Fasting - allows your insulin levels to naturally decrease  o Avoiding carbohydrates - carbs trigger increase in insulin Low Carb Healthy Fat (LCHF) o Get a free app for your phone, such as MyFitnessPal, to help you track your macronutrients (carbs/protein/fats) and to track your weight and body measurements to see your progress o Set your goal for around 10% carbs/20% protein/70% fat o A good starting goal for amount of net carbs per day is 50 grams (some will aim for 20 grams) o "Net carbs" refers to total grams of carbs minus grams of fiber (as fiber is not typically absorbed). For example, if a food has 5g total carb and 3g fiber, that would be 2g net carbs o Increase healthy fats - eg. olive oil, eggs, nuts, avocado, cheese, butter, coconut, meats, fish o Avoid high carb foods - eg. bread, pasta, potatoes, rice, cookies, soda, juice, anything sugary o Buy full-fat ingredients (avoid low-fat versions, which often have more sugar) o No need to count calories, but pay close attention to grams of carbs on labels Intermittent Fasting (IF) o "Fasting" is going a period of time without eating - it helps to stay busy and well-hydrated o Purpose of fasting is to allow insulin levels to drop as low as possible, allowing your body to switch into fat-burning mode o With this diet there are many approaches to fasting, but 16:8 and 24hr fasts are commonly used o 16:8 fast, usually 5-7 days a week -  Fasting for 16 hours of the day, then eating all meals for the day over course of 8 hours. o 24 hour fast, usually 1-3 days a week - Typically eating one meal a day, then fasting until the next day. Plenty of fluids (and some salt to help you hold onto fluids) are recommended during longer fasts.  o During fasts certain beverages are still acceptable - water, sparkling water, bone broth, black tea or coffee, or tea/coffee with small amount of heavy whipping cream Special note for those on diabetic medications o Discuss your medications with your physician. You may need to hold your medication or adjust to only taking when eating. Diabetics should keep close track of their blood sugars when making any changes to diet/meds, to ensure they are staying within normal limits For more info about LCHF/IF o Watch "Therapeutic Fasting - Solving the Two-Compartment Problem" video by Dr. Jason Fung on YouTube (https://www.youtube.com/watch?v=tIuj-oMN-Fk) for a great intro to these concepts o Read "The Obesity Code" and/or "The Complete Guide to Fasting" by Dr. Jason Fung o Go to www.dietdoctor.com for explanations, recipes, and infographics about foods to eat/avoid EXAMPLES TO GET STARTED Fasting Beverages -water (can add  tsp Pink Himalayan salt once or twice a day to help stay hydrated for longer fasts) -Sparkling water (such as [place] or similar; avoid any with artificial sweeteners)  -Bone broth (multiple recipes available online or can buy pre-made) -Tea or Coffee (Adding   heavy whipping cream or coconut oil to your tea or coffee can be helpful if you find yourself getting too hungry during the fasts. Can also add cinnamon for flavor. Or "bulletproof coffee.") Low Carb Healthy Fat Breakfast (if not fasting) -eggs in butter or olive oil with avocado -omelet with veggies and cheese  Lunch -hamburger with cheese and avocado wrapped in lettuce (no bun, no ketchup) -meat and cheese wrapped in lettuce  (can dip in mustard or olive oil/vinegar/mayo) -salad with meat/cheese/nuts and higher fat dressing (vinaigrette or Ranch, etc) -tuna salad lettuce wrap -taco meat with cheese, sour cream, guacamole, cheese over lettuce  Dinner -steak with herb butter or Barnaise sauce -"Fathead" pizza (uses cheese and almond flour for the dough - several recipes available online) -roasted or grilled chicken with skin on, with low carb sauce (buffalo, garlic butter, alfredo, pesto, etc) -baked salmon with lemon butter -chicken alfredo with zucchini noodles -Indian butter chicken with low carb garlic naan -egg roll in a bowl  Side Dishes -mashed cauliflower (homemade or available in freezer section) -roast vegetables (green veggies that grow above ground rather than root veggies) with butter or cheese -Caprese salad (fresh mozzarella, tomato and basil with olive oil) -homemade low-carb coleslaw Snacks/Desserts (try to avoid unnecessary snacking and sweets in general) -celery or cucumber dipped in guacamole or sour cream dip -cheese and meat slices  -raspberries with whipped cream (can make homemade with no sugar added) -low carb [place] butter cake  AVOID - sugar, diet/regular soda, potatoes, breads, rice, pasta, candy, cookies, cakes, muffins, juice, high carb fruit (bananas, grapes), beer, ketchup, barbeque and other sweet sauces 

## 2016-09-28 NOTE — Progress Notes (Addendum)
Lindsay [name], is a 35 y.o. female  NFA:[id number]  ION:[id number]  DOB - 11-Feb-1981  Chief Complaint  Patient presents with  . Diabetes  . Hypertension        Subjective:   Lindsay [name] is a 35 y.o. female here today for a follow up visit.  Recent hospitalization 9/17-9/20/17 for NSTEMI, 09/07/16 sp PCI/stent to mid LAD, PTCA to second diagonal ostium, ef 35-45% at time.  Echo 9/19 ef 55-60%.  Also hx of morbid obesity, BMI >58, ooc dm w/ neuropathy, on long term insulin, w/ diet noncompliance.  Last few days - weeks, c/o of significant burning bilat le edema and shoulder pain, right (where had infection, sp debridement in past)  Pt has had to take 2 ntg since dc, 1 ntg each time, w/ resolution of chest pain at time. Of note, she states she overdid it to early after d/c.  Patient has No headache, No chest pain, No abdominal pain - No Nausea, No new weakness tingling or numbness, No Cough - SOB.  Again declined flu vac.  Has not had eye appt yet, has eye doctor and will call.  No problems updated.  ALLERGIES: Allergies  Allergen Reactions  . Hydrazine Yellow [Tartrazine] Shortness Of Breath and Swelling    Swelling mostly noticed in legs and feet, retaining urination, shortness of breaht, and minor chest pain  . Lisinopril Shortness Of Breath    Was on prinzide; had sob/chest pain on it.  . Tylenol [Acetaminophen] Itching and Swelling    Itching of the mouth, swelling of tongue and stomach started hurting    PAST MEDICAL HISTORY: Past Medical History:  Diagnosis Date  . ARF (acute renal failure) ([place]) 04/2015  . Asthma   . Cellulitis of right upper extremity   . Coronary artery disease   . Diabetes mellitus    insulin dependent  . Hyperlipidemia LDL goal <70   . Hypertension   . NSTEMI (non-ST elevated myocardial infarction) ([place]) 08/2016  . Obesity   . S/P angioplasty with stent 08/2016   DES to mLAD and PTCA only to 2nd diag ostium.     MEDICATIONS AT  HOME: Prior to Admission medications   Medication Sig Start Date End Date Taking? Authorizing Provider  ACCU-CHEK SOFTCLIX LANCETS lancets Use as instructed for 4 times daily blood glucose monitoring 06/23/16  Yes [name], MD  albuterol (PROVENTIL HFA;VENTOLIN HFA) 108 (90 Base) MCG/ACT inhaler Inhale 2 puffs into the lungs every 6 (six) hours as needed for wheezing or shortness of breath. 06/10/16  Yes [name], MD  amLODipine (NORVASC) 10 MG tablet Take 1 tablet (10 mg total) by mouth daily. 06/10/16  Yes [name], MD  aspirin 81 MG tablet Take 81 mg by mouth daily.   Yes Historical Provider, MD  atorvastatin (LIPITOR) 80 MG tablet Take 1 tablet (80 mg total) by mouth daily at 6 PM. 09/09/16  Yes Bhavinkumar Bhagat, PA  Blood Glucose Monitoring Suppl (ACCU-CHEK AVIVA PLUS) w/Device KIT Use as directed for 4 times daily blood glucose monitoring 06/23/16  Yes [name], MD  gabapentin (NEURONTIN) 300 MG capsule Take 1 capsule (300 mg total) by mouth 4 (four) times daily. 09/28/16  Yes [name], MD  glucose blood (ACCU-CHEK AVIVA PLUS) test strip Use as instructed for 4 times daily blood glucose monitoring 06/17/16  Yes [name], MD  glucose blood (ACCU-CHEK AVIVA) test strip Use as instructed 07/15/16  Yes [name], MD  insulin aspart (NOVOLOG) 100 UNIT/ML FlexPen Inject 10 Units into the skin 3 (three) times daily with meals. Patient taking differently: Inject 20 Units into the skin 3 (three) times daily with meals.  06/10/16  Yes [name], MD  insulin glargine (LANTUS) 100 unit/mL SOPN Inject 0.4 mLs (40 Units total) into the skin 2 (two) times daily. 09/09/16  Yes Bhavinkumar Bhagat, PA  Insulin Pen Needle 31G X 5 MM MISC 1 application by Does not apply route Nightly. 07/28/16  Yes [name], MD  Lancet Devices [facility]) lancets Use as instructed for 4 times daily blood glucose monitoring 06/23/16  Yes [name], MD    losartan (COZAAR) 25 MG tablet Take 1 tablet (25 mg total) by mouth daily. 09/09/16  Yes Bhavinkumar Bhagat, PA  medroxyPROGESTERone (DEPO-PROVERA) 150 MG/ML injection Inject 150 mg into the muscle every 3 (three) months.   Yes Historical Provider, MD  metFORMIN (GLUCOPHAGE) 1000 MG tablet Take 1 tablet (1,000 mg total) by mouth 2 (two) times daily with a meal. 06/10/16  Yes [name], MD  metoprolol tartrate (LOPRESSOR) 25 MG tablet Take 1 tablet (25 mg total) by mouth 2 (two) times daily. 09/09/16  Yes Bhavinkumar Bhagat, PA  nitroGLYCERIN (NITROSTAT) 0.4 MG SL tablet Place 1 tablet (0.4 mg total) under the tongue every 5 (five) minutes as needed for chest pain. 09/09/16  Yes Bhavinkumar Bhagat, PA  ticagrelor (BRILINTA) 90 MG TABS tablet Take 1 tablet (90 mg total) by mouth 2 (two) times daily. 09/09/16  Yes Bhavinkumar Bhagat, PA  ferrous sulfate 325 (65 FE) MG tablet Take 1 tablet (325 mg total) by mouth 2 (two) times daily with a meal. Patient not taking: Reported on 09/28/2016 05/13/15   Domenic Polite, MD     Objective:   Vitals:   09/28/16 1134  BP: (!) 143/92  Pulse: 88  Resp: 16  Temp: 98.6 F (37 C)  TempSrc: Oral  SpO2: 100%  Weight: (!) 381 lb 6.4 oz (173 kg)   Up 13  lbs since last saw cards in 09/16/16   Exam General appearance : Awake, alert, not in any distress. Speech Clear. Not toxic looking, extreme morbid obese HEENT: Atraumatic and Normocephalic, pupils equally reactive to light. Neck: supple, no JVD. No cervical lymphadenopathy.  Chest:Good air entry bilaterally, no added sounds. CVS: S1 S2 regular, no murmurs/gallups or rubs. Abdomen: Bowel sounds active, Non tender and not distended with no gaurding, rigidity or rebound. Foot exam: bilateral peripheral pulses 2+ (dorsalis pedis and post tibialis pulses), no ulcers noted/no ecchymosis, warm to touch, monofilament testing 1/3 bilat intact with significant  Sensory deficits.  Trace edema bilat. Neurology:  Awake alert, and oriented X 3, CN II-XII grossly intact, Non focal Skin:No Rash  Data Review Lab Results  Component Value Date   HGBA1C 12.4 (H) 09/08/2016   HGBA1C 13.1 06/10/2016   HGBA1C 8.10 06/25/2015    Depression screen PHQ 2/9 09/28/2016 07/28/2016 06/10/2016  Decreased Interest 0 0 0  Down, Depressed, Hopeless 0 0 0  PHQ - 2 Score 0 0 0     Coronary Balloon Angioplasty   09/07/16  Coronary Stent Intervention  Left Heart Cath and Coronary Angiography  Conclusion     The left ventricular ejection fraction is 35-45% by visual estimate.  LV end diastolic pressure is moderately elevated.  There is no mitral valve regurgitation.   Total occlusion of the mid LAD with right to left collaterals via the septal perforators and  around the apex. This is a culprit for the patient's presentation and elevated troponins.  Successful PCI and stent implantation reducing the 100% stenosis to 0% with TIMI grade 3 flow. Final postdilatation diameter 3.5 mm. Xience Alpine 3.0 x 18 mm DES was used.  Successful PTCA of second diagonal ostium from 75% to 50% pre-stenting.  25-40% stenosis in the proximal circumflex, and 50% segmental stenosis in the proximal first obtuse marginal.  Anteroapical akinesis. LVEDP 26 mmHg. Estimated EF 35-45%.  Recommendations:   Aggressive risk factor modification including high-intensity statin therapy, aggressive glycemic control, weight reduction, blood pressure control, low-fat carbohydrate modified diet, and exercise.  Eligible for discharge in 24 hours assuming no complications.  IV heparin was discontinued.  Aspirin and Brilinta 6-12 months.  Beta blocker therapy and ACE/ARB to treat what I believe will be reversible LV systolic dysfunction.     Echo 09/08/16 Study Conclusions  - Left ventricle: The cavity size was normal. There was mild   concentric hypertrophy. Systolic function was normal. The   estimated ejection fraction was  in the range of 55% to 60%. Mild   hypokinesis of the anteroseptal, anterior, and apical myocardium.   Left ventricular diastolic function parameters were normal. - Mitral valve: Transvalvular velocity was within the normal range.   There was no evidence for stenosis. There was trivial   regurgitation. - Right ventricle: The cavity size was normal. Wall thickness was   normal. Systolic function was normal.   09/27/16 sleep study IMPRESSIONS - Mild obstructive sleep apnea occurred during this study (AHI =  8.4/h). - Insufficient early events to meet protocol requirements for  split CPAP titration - No significant central sleep apnea occurred during this study  (CAI = 0.3/h). - Mild oxygen desaturation was noted during this study (Min O2 =  84.00%). - The patient snored with Moderate snoring volume. - No cardiac abnormalities were noted during this study. - Clinically significant periodic limb movements did not occur  during sleep. No significant associated arousals.  DIAGNOSIS - Obstructive Sleep Apnea (327.23 [G47.33 ICD-10])  RECOMMENDATIONS - CPAP titration or a fitted oral appliance may be options if  conservative measures including weight loss are insufficient. - Positional therapy avoiding supine position during sleep. - Avoid alcohol, sedatives and other CNS depressants that may  worsen sleep apnea and disrupt normal sleep architecture. - Sleep hygiene should be reviewed to assess factors that may  improve sleep quality. - Weight management and regular exercise should be initiated or  continued if appropriate.  [Electronically signed] 09/27/2016 09:42 AM  Baird Lyons MD, ABSM Diplomate, American Board of Sleep Medicine  09/06/16 ct chest IMPRESSION: 1. No aneurysm or dissection of the aorta is identified. No acute vascular abnormality. 2. Clusters of nodules in the left lower lobe are probably represent early bronchopneumonia. These results were called by  telephone at the time of interpretation on 09/07/2016 at 1:01 am to [facility], who verbally acknowledged these results.   Electronically Signed   By: Kristine Garbe M.D.   On: 09/07/2016 01:01   Assessment & Plan   1. NSTEMI (non-ST elevated myocardial infarction) ([place]) Sp ptca/stent to LAD, on asa/brilinta, bb, losartan, atorvastatin 80, Aggressive lifestyle modifications /weight loss recd as well.  2. Numbness and tingling Due to ooc dm w/ neuropathy - Glucose (CBG) - gabapentin (NEURONTIN) 300 MG capsule; Take 1 capsule (300 mg total) by mouth 4 (four) times daily.  Dispense: 120 capsule; Refill: 2 - needs tighter control of diabetes, now  on higher dose of insulin, lantus and novolog, but pt had diet noncompliance, cookies, etc.   3. CAD in native artery See above, recently f/u w/ cards, has fu w/ cards in Dec.  4. Type 2 diabetes mellitus with diabetic neuropathy, with long-term current use of insulin ([place]) aic 12 recently,  amb ref optho - needs eye exam Reiterated diet compliance, a major factor of her OOC state. Continue close monitor cbg. She wants to lose weight, and wants to try low carb diet. If so , her insulin needs may drop considerably, so did not make any changes again. Will see her in 3-4 wks  For f/u  5. Morbid obesity ([place]) dw pt at length about weight loss and low carb diet. If she can restrict her carbs <50gm, she will lose weight. Gave her info on LC/hf /if.  dw her that if she truly does go low carb, check her cbg more often, because she will need considerably less insulin as her carb intake decreases.  Advised her to sign up for mychart and we can communicate faster that way. - fu in 3 - 4 wks - if no success, consider bariatric surgery eval. later  6. Essential hypertension Elevated today, goal <140/80, pt wants to try exercise/low salt diet, will f/u in 3-4 wks, if still elevated will adjust. - bp was nml 9/27 at heart care  7. Mild osa  on study Recd aggressive lifestyle mod and weight loss  8. Possible cap/uri recently on hospitalizaton Treated fully w/ abx.  Patient have been counseled extensively about nutrition and exercise  Return in about 4 weeks (around 10/26/2016) for htn , morbid obesity.  The patient was given clear instructions to go to ER or return to medical center if symptoms don't improve, worsen or new problems develop. The patient verbalized understanding. The patient was told to call to get lab results if they haven't heard anything in the next week.   This note has been created with Surveyor, quantity. Any transcriptional errors are unintentional.   [name], MD, [facility], [place]   09/28/2016, 12:33 PM

## 2016-09-29 ENCOUNTER — Encounter: Payer: Self-pay | Admitting: Internal Medicine

## 2016-10-05 ENCOUNTER — Emergency Department (HOSPITAL_COMMUNITY)
Admission: EM | Admit: 2016-10-05 | Discharge: 2016-10-05 | Disposition: A | Discharge status: Home / Self Care | Payer: Medicaid Other | Attending: Emergency Medicine | Admitting: Emergency Medicine

## 2016-10-05 ENCOUNTER — Encounter (HOSPITAL_COMMUNITY): Payer: Self-pay | Admitting: Emergency Medicine

## 2016-10-05 DIAGNOSIS — Y999 Unspecified external cause status: Secondary | ICD-10-CM | POA: Insufficient documentation

## 2016-10-05 DIAGNOSIS — E1140 Type 2 diabetes mellitus with diabetic neuropathy, unspecified: Secondary | ICD-10-CM | POA: Diagnosis not present

## 2016-10-05 DIAGNOSIS — J45909 Unspecified asthma, uncomplicated: Secondary | ICD-10-CM | POA: Insufficient documentation

## 2016-10-05 DIAGNOSIS — I10 Essential (primary) hypertension: Secondary | ICD-10-CM | POA: Insufficient documentation

## 2016-10-05 DIAGNOSIS — Z794 Long term (current) use of insulin: Secondary | ICD-10-CM | POA: Diagnosis not present

## 2016-10-05 DIAGNOSIS — Y929 Unspecified place or not applicable: Secondary | ICD-10-CM | POA: Diagnosis not present

## 2016-10-05 DIAGNOSIS — Z7984 Long term (current) use of oral hypoglycemic drugs: Secondary | ICD-10-CM | POA: Diagnosis not present

## 2016-10-05 DIAGNOSIS — I252 Old myocardial infarction: Secondary | ICD-10-CM | POA: Insufficient documentation

## 2016-10-05 DIAGNOSIS — S30820A Blister (nonthermal) of lower back and pelvis, initial encounter: Secondary | ICD-10-CM | POA: Diagnosis present

## 2016-10-05 DIAGNOSIS — X58XXXA Exposure to other specified factors, initial encounter: Secondary | ICD-10-CM | POA: Insufficient documentation

## 2016-10-05 DIAGNOSIS — Z955 Presence of coronary angioplasty implant and graft: Secondary | ICD-10-CM | POA: Insufficient documentation

## 2016-10-05 DIAGNOSIS — Y939 Activity, unspecified: Secondary | ICD-10-CM | POA: Diagnosis not present

## 2016-10-05 DIAGNOSIS — I2510 Atherosclerotic heart disease of native coronary artery without angina pectoris: Secondary | ICD-10-CM | POA: Insufficient documentation

## 2016-10-05 DIAGNOSIS — R238 Other skin changes: Secondary | ICD-10-CM

## 2016-10-05 DIAGNOSIS — Z79899 Other long term (current) drug therapy: Secondary | ICD-10-CM | POA: Diagnosis not present

## 2016-10-05 DIAGNOSIS — Z7982 Long term (current) use of aspirin: Secondary | ICD-10-CM | POA: Insufficient documentation

## 2016-10-05 MED ORDER — CEPHALEXIN 500 MG PO CAPS: 500.0000 mg | ORAL_CAPSULE | Freq: Four times a day (QID) | ORAL | 0 refills | Status: DC

## 2016-10-05 NOTE — Discharge Instructions (Signed)
Read the information below.  Use the prescribed medication as directed.  Please discuss all new medications with your pharmacist.  You may return to the Emergency Department at any time for worsening condition or any new symptoms that concern you.     If you develop redness, swelling, pus draining from the wound, or fevers greater than 100.4, return to the ER immediately for a recheck.   °

## 2016-10-05 NOTE — ED Provider Notes (Signed)
[facility] DEPT Provider Note   CSN: [id number] Arrival date & time: 10/05/16  1053  By signing my name below, I, Emmanuella Mensah, attest that this documentation has been prepared under the direction and in the presence of [facility], PA-C. Electronically Signed: Judithann Sauger, ED Scribe. 10/05/16. 11:44 AM.   History   Chief Complaint Chief Complaint  Patient presents with  . Abscess    HPI Comments: Lindsay [name] is a 35 y.o. female with a hx of DM, hypertension, CAD, NSTEMI, and obesity who presents to the Emergency Department complaining of a moderately painful area of swelling to her left lower back onset yesterday. She states that the area has begun to drain clear fluid today while in the waiting room. No other alleviating factors noted. She states that she tried Ibuprofen yesterday with mild relief of the pain. She denies any known falls, injuries, or trauma. She also denies any fever, chills, nausea, vomiting, or any other symptoms.   The history is provided by the patient. No language interpreter was used.    Past Medical History:  Diagnosis Date  . ARF (acute renal failure) ([place]) 04/2015  . Asthma   . Cellulitis of right upper extremity   . Coronary artery disease   . Diabetes mellitus    insulin dependent  . Hyperlipidemia LDL goal <70   . Hypertension   . NSTEMI (non-ST elevated myocardial infarction) ([place]) 08/2016  . Obesity   . S/P angioplasty with stent 08/2016   DES to mLAD and PTCA only to 2nd diag ostium.     Patient Active Problem List   Diagnosis Date Noted  . CAD in native artery 09/11/2016  . Diabetes mellitus with complication ([place])   . Pain in the chest 09/07/2016  . Community acquired pneumonia 09/07/2016  . Elevated troponin 09/07/2016  . NSTEMI (non-ST elevated myocardial infarction) ([place])   . DM neuropathy, type II diabetes mellitus ([place]) 06/10/2016  . Morbid obesity ([place]) 06/10/2016  . Pressure ulcer 05/17/2015  . Weakness  05/16/2015  . Generalized weakness 05/15/2015  . 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 Cellulitis of right upper arm   . ARF (acute renal failure) ([place])   . Septic arthritis ([place])   . Essential hypertension 04/30/2015  . Cellulitis 04/30/2015  . Hyponatremia 04/30/2015  . Abscess   . Abscess of right shoulder   . Diabetes mellitus due to underlying condition without complications ([place]) 67/67/2094  . Obesity 07/16/2014  . Cellulitis of left upper extremity 07/16/2014    Past Surgical History:  Procedure Laterality Date  . CARDIAC CATHETERIZATION N/A 09/07/2016   Procedure: Left Heart Cath and Coronary Angiography;  Surgeon: [name], MD;  Location: [facility];  Service: Cardiovascular;  Laterality: N/A;  . CARDIAC CATHETERIZATION N/A 09/07/2016   Procedure: Coronary Stent Intervention;  Surgeon: [name], MD;  Location: [facility];  Service: Cardiovascular;  Laterality: N/A;  . CARDIAC CATHETERIZATION N/A 09/07/2016   Procedure: Coronary Balloon Angioplasty;  Surgeon: [name], MD;  Location: [facility];  Service: Cardiovascular;  Laterality: N/A;  . CESAREAN SECTION    . IRRIGATION AND DEBRIDEMENT SHOULDER Right 04/30/2015   Procedure: IRRIGATION AND DEBRIDEMENT SHOULDER;  Surgeon: Renette Butters, MD;  Location: [facility];  Service: Orthopedics;  Laterality: Right;  . IRRIGATION AND DEBRIDEMENT SHOULDER Right 05/01/2015  . LEG SURGERY    . SHOULDER ARTHROSCOPY Right 04/30/2015   Procedure: ARTHROSCOPY SHOULDER;  Surgeon: Renette Butters, MD;  Location: [facility];  Service: Orthopedics;  Laterality: Right;  . TONSILLECTOMY      OB History    No data available       Home Medications    Prior to Admission medications   Medication Sig Start Date End Date Taking? Authorizing Provider  ACCU-CHEK SOFTCLIX LANCETS lancets Use as instructed for 4 times daily blood glucose monitoring 06/23/16   [name], MD  albuterol (PROVENTIL HFA;VENTOLIN HFA) 108 (90  Base) MCG/ACT inhaler Inhale 2 puffs into the lungs every 6 (six) hours as needed for wheezing or shortness of breath. 06/10/16   [name], MD  amLODipine (NORVASC) 10 MG tablet Take 1 tablet (10 mg total) by mouth daily. 06/10/16   [name], MD  aspirin 81 MG tablet Take 81 mg by mouth daily.    Historical Provider, MD  atorvastatin (LIPITOR) 80 MG tablet Take 1 tablet (80 mg total) by mouth daily at 6 PM. 09/09/16   Bhavinkumar Bhagat, PA  Blood Glucose Monitoring Suppl (ACCU-CHEK AVIVA PLUS) w/Device KIT Use as directed for 4 times daily blood glucose monitoring 06/23/16   [name], MD  cephALEXin (KEFLEX) 500 MG capsule Take 1 capsule (500 mg total) by mouth 4 (four) times daily. 10/05/16   Clayton Bibles, PA-C  ferrous sulfate 325 (65 FE) MG tablet Take 1 tablet (325 mg total) by mouth 2 (two) times daily with a meal. Patient not taking: Reported on 09/28/2016 05/13/15   Domenic Polite, MD  gabapentin (NEURONTIN) 300 MG capsule Take 1 capsule (300 mg total) by mouth 4 (four) times daily. 09/28/16   [name], MD  glucose blood (ACCU-CHEK AVIVA PLUS) test strip Use as instructed for 4 times daily blood glucose monitoring 06/17/16   [name], MD  glucose blood (ACCU-CHEK AVIVA) test strip Use as instructed 07/15/16   [name], MD  insulin aspart (NOVOLOG) 100 UNIT/ML FlexPen Inject 10 Units into the skin 3 (three) times daily with meals. Patient taking differently: Inject 20 Units into the skin 3 (three) times daily with meals.  06/10/16   [name], MD  insulin glargine (LANTUS) 100 unit/mL SOPN Inject 0.4 mLs (40 Units total) into the skin 2 (two) times daily. 09/09/16   Bhavinkumar Bhagat, PA  Insulin Pen Needle 31G X 5 MM MISC 1 application by Does not apply route Nightly. 07/28/16   [name], MD  Lancet Devices [facility]) lancets Use as instructed for 4 times daily blood glucose monitoring 06/23/16   [name], MD  losartan (COZAAR)  25 MG tablet Take 1 tablet (25 mg total) by mouth daily. 09/09/16   Bhavinkumar Bhagat, PA  medroxyPROGESTERone (DEPO-PROVERA) 150 MG/ML injection Inject 150 mg into the muscle every 3 (three) months.    Historical Provider, MD  metFORMIN (GLUCOPHAGE) 1000 MG tablet Take 1 tablet (1,000 mg total) by mouth 2 (two) times daily with a meal. 06/10/16   [name], MD  metoprolol tartrate (LOPRESSOR) 25 MG tablet Take 1 tablet (25 mg total) by mouth 2 (two) times daily. 09/09/16   Bhavinkumar Bhagat, PA  nitroGLYCERIN (NITROSTAT) 0.4 MG SL tablet Place 1 tablet (0.4 mg total) under the tongue every 5 (five) minutes as needed for chest pain. 09/09/16   Bhavinkumar Bhagat, PA  ticagrelor (BRILINTA) 90 MG TABS tablet Take 1 tablet (90 mg total) by mouth 2 (two) times daily. 09/09/16   Leanor Kail, PA    Family History Family History  Problem Relation Age of Onset  . Diabetes Mother   . Hypertension Mother   . Heart disease Mother   . Diabetes Father   . Heart disease Father   . Stroke Maternal Grandmother   . Cancer Maternal Grandmother     Social History Social History  Substance Use Topics  . Smoking status: Never Smoker  . Smokeless tobacco: Never Used  . Alcohol use Yes     Comment: socially     Allergies   Hydrazine yellow [tartrazine]; Lisinopril; and Tylenol [acetaminophen]   Review of Systems Review of Systems  Constitutional: Negative for chills and fever.  Gastrointestinal: Negative for nausea and vomiting.  Skin: Positive for wound.  All other systems reviewed and are negative.    Physical Exam Updated Vital Signs BP 159/78 (BP Location: Right Arm)   Pulse 90   Temp 98.3 F (36.8 C) (Oral)   Resp 18   Ht _0  (1.727 m)   Wt (!) 173.7 kg   LMP 09/06/2016   SpO2 97%   BMI 58.23 kg/m   Physical Exam  Constitutional: She appears well-developed and well-nourished. No distress.  HENT:  Head: Normocephalic and atraumatic.  Neck: Neck supple.    Pulmonary/Chest: Effort normal.  Musculoskeletal: She exhibits tenderness.  Tenderness to left lower back, involving a bulla with clear fluid that is approx 4 cm across with underlying erythema.  Few very small blisters over right side of back, all intact, nontender.   Neurological: She is alert.  Skin: She is not diaphoretic.  Nursing note and vitals reviewed.    ED Treatments / Results  DIAGNOSTIC STUDIES: Oxygen Saturation is 97% on RA, normal by my interpretation.    COORDINATION OF CARE: 11:28 AM- Will consult attending for plan for treatment.    Labs (all labs ordered are listed, but only abnormal results are displayed) Labs Reviewed - No data to display  EKG  EKG Interpretation None       Radiology No results found.  Procedures Procedures (including critical care time)  Wound debridement Performed by Joli Koob  Large bulla left lower back cleansed with iodine Topical cold skin spray Trimmed blister around edge Dressed with bacitracin, nonadherent bandage, tape   Medications Ordered in ED Medications - No data to display   Initial Impression / Assessment and Plan / ED Course  Clayton Bibles, PA-C has reviewed the triage vital signs and the nursing notes.  Pertinent labs & imaging results that were available during my care of the patient were reviewed by me and considered in my medical decision making (see chart for details).  Clinical Course    Afebrile, nontoxic patient with unexplained blisters and larger bulla over lower back.  No associated cellulitis.  No abscess.  Larger bulla with clear fluid that drained spontaneously.  Given diabetes and large body habitus as well as tenderness, pt covered with keflex.  Ruptured blister skin trimmed.  Pt denies any burns or contact or exposure, allergies that could have caused blisters.  Have advised close follow up with PCP for evaluation of blisters and wound recheck.   D/C home with keflex.  Discussed result,  findings, treatment, and follow up  with patient.  Pt given return precautions.  Pt verbalizes understanding and agrees with plan.       Final Clinical Impressions(s) / ED Diagnoses   Final diagnoses:  Skin bulla  Blisters of multiple sites    New Prescriptions New Prescriptions   CEPHALEXIN (KEFLEX) 500 MG CAPSULE  Take 1 capsule (500 mg total) by mouth 4 (four) times daily.   I personally performed the services described in this documentation, which was scribed in my presence. The recorded information has been reviewed and is accurate.    Clayton Bibles, PA-C 10/05/16 [place], MD 10/05/16 2024

## 2016-10-05 NOTE — ED Triage Notes (Signed)
Pt states she has an abscess on lower back. Pt first noticed it on Sunday. Denies drainage.

## 2016-10-05 NOTE — ED Notes (Signed)
Declined W/C at D/C and was escorted to lobby by RN. 

## 2016-10-28 ENCOUNTER — Other Ambulatory Visit: Payer: Medicaid Other | Admitting: *Deleted

## 2016-10-28 DIAGNOSIS — E785 Hyperlipidemia, unspecified: Secondary | ICD-10-CM

## 2016-10-28 LAB — LIPID PANEL
CHOL/HDL RATIO: 3.5 ratio (ref <5.0)
Cholesterol: 124 mg/dL (ref <200)
HDL: 35 mg/dL — AB (ref >50)
LDL CALC: 54 mg/dL
Triglycerides: 174 mg/dL — ABNORMAL HIGH (ref <150)
VLDL: 35 mg/dL — ABNORMAL HIGH (ref <30)

## 2016-10-28 LAB — HEPATIC FUNCTION PANEL
ALBUMIN: 3.9 g/dL (ref 3.6–5.1)
ALT: 17 U/L (ref 6–29)
AST: 15 U/L (ref 10–30)
Alkaline Phosphatase: 97 U/L (ref 33–115)
BILIRUBIN DIRECT: 0.1 mg/dL (ref <=0.2)
BILIRUBIN TOTAL: 0.4 mg/dL (ref 0.2–1.2)
Indirect Bilirubin: 0.3 mg/dL (ref 0.2–1.2)
Total Protein: 6.6 g/dL (ref 6.1–8.1)

## 2016-10-29 ENCOUNTER — Telehealth: Payer: Self-pay | Admitting: Interventional Cardiology

## 2016-10-29 NOTE — Telephone Encounter (Signed)
Returned pts call and discussed her lab results. She verbalized appreciation and understanding.

## 2016-10-29 NOTE — Telephone Encounter (Signed)
New message  ° °Patient calling back - test results.   °

## 2016-11-03 ENCOUNTER — Ambulatory Visit: Payer: Medicaid Other | Attending: Internal Medicine | Admitting: Internal Medicine

## 2016-11-03 ENCOUNTER — Encounter: Payer: Self-pay | Admitting: Internal Medicine

## 2016-11-03 VITALS — BP 144/95 | HR 82 | Temp 98.7°F | Resp 15 | Wt 376.0 lb

## 2016-11-03 DIAGNOSIS — Z955 Presence of coronary angioplasty implant and graft: Secondary | ICD-10-CM | POA: Insufficient documentation

## 2016-11-03 DIAGNOSIS — G4733 Obstructive sleep apnea (adult) (pediatric): Secondary | ICD-10-CM | POA: Insufficient documentation

## 2016-11-03 DIAGNOSIS — E785 Hyperlipidemia, unspecified: Secondary | ICD-10-CM | POA: Diagnosis not present

## 2016-11-03 DIAGNOSIS — R200 Anesthesia of skin: Secondary | ICD-10-CM

## 2016-11-03 DIAGNOSIS — Z7982 Long term (current) use of aspirin: Secondary | ICD-10-CM | POA: Insufficient documentation

## 2016-11-03 DIAGNOSIS — Z886 Allergy status to analgesic agent status: Secondary | ICD-10-CM | POA: Insufficient documentation

## 2016-11-03 DIAGNOSIS — I2510 Atherosclerotic heart disease of native coronary artery without angina pectoris: Secondary | ICD-10-CM | POA: Insufficient documentation

## 2016-11-03 DIAGNOSIS — M79642 Pain in left hand: Secondary | ICD-10-CM | POA: Diagnosis not present

## 2016-11-03 DIAGNOSIS — Z6841 Body Mass Index (BMI) 40.0 and over, adult: Secondary | ICD-10-CM | POA: Diagnosis not present

## 2016-11-03 DIAGNOSIS — Z888 Allergy status to other drugs, medicaments and biological substances status: Secondary | ICD-10-CM | POA: Insufficient documentation

## 2016-11-03 DIAGNOSIS — I252 Old myocardial infarction: Secondary | ICD-10-CM | POA: Diagnosis not present

## 2016-11-03 DIAGNOSIS — M79641 Pain in right hand: Secondary | ICD-10-CM | POA: Insufficient documentation

## 2016-11-03 DIAGNOSIS — I10 Essential (primary) hypertension: Secondary | ICD-10-CM | POA: Diagnosis not present

## 2016-11-03 DIAGNOSIS — G8929 Other chronic pain: Secondary | ICD-10-CM | POA: Diagnosis not present

## 2016-11-03 DIAGNOSIS — E118 Type 2 diabetes mellitus with unspecified complications: Secondary | ICD-10-CM

## 2016-11-03 DIAGNOSIS — Z794 Long term (current) use of insulin: Secondary | ICD-10-CM | POA: Insufficient documentation

## 2016-11-03 DIAGNOSIS — R202 Paresthesia of skin: Secondary | ICD-10-CM

## 2016-11-03 DIAGNOSIS — M545 Low back pain: Secondary | ICD-10-CM

## 2016-11-03 DIAGNOSIS — E1140 Type 2 diabetes mellitus with diabetic neuropathy, unspecified: Secondary | ICD-10-CM | POA: Insufficient documentation

## 2016-11-03 DIAGNOSIS — N179 Acute kidney failure, unspecified: Secondary | ICD-10-CM | POA: Diagnosis not present

## 2016-11-03 LAB — GLUCOSE, POCT (MANUAL RESULT ENTRY): POC Glucose: 239 mg/dl — AB (ref 70–99)

## 2016-11-03 MED ORDER — INSULIN GLARGINE 100 UNITS/ML SOLOSTAR PEN
42.0000 [IU] | PEN_INJECTOR | Freq: Two times a day (BID) | SUBCUTANEOUS | 3 refills | Status: DC
Start: 2016-11-03 — End: 2017-01-29

## 2016-11-03 MED ORDER — MELOXICAM 15 MG PO TABS: 15.0000 mg | ORAL_TABLET | Freq: Every day | ORAL | 0 refills | Status: DC

## 2016-11-03 MED ORDER — INSULIN PEN NEEDLE 31G X 5 MM MISC: 1.0000 "application " | Freq: Every evening | 3 refills | Status: DC

## 2016-11-03 MED ORDER — METOPROLOL TARTRATE 50 MG PO TABS: 50.0000 mg | ORAL_TABLET | Freq: Two times a day (BID) | ORAL | 3 refills | Status: DC

## 2016-11-03 MED ORDER — GABAPENTIN 300 MG PO CAPS: 600.0000 mg | ORAL_CAPSULE | Freq: Four times a day (QID) | ORAL | 3 refills | Status: DC

## 2016-11-03 MED ORDER — METFORMIN HCL 1000 MG PO TABS: 1000.0000 mg | ORAL_TABLET | Freq: Two times a day (BID) | ORAL | 3 refills | Status: DC

## 2016-11-03 MED ORDER — METHOCARBAMOL 500 MG PO TABS: 500.0000 mg | ORAL_TABLET | Freq: Four times a day (QID) | ORAL | 2 refills | Status: DC

## 2016-11-03 NOTE — Progress Notes (Signed)
Pt is in the office today for back and hand pain Pt states her pain level today in the office is a 9 Pt states her pain is coming her back Pt states her back pain comes and goes

## 2016-11-03 NOTE — Patient Instructions (Addendum)
- f/u [facility]  2 wks w/ dm   Check blood sugars on waking up 2  times a week  Also check blood sugars about 2 hours after a meal and do this after different meals by rotation  Recommended blood sugar levels on waking up is 90-130 and about 2 hours after meal is 130-160  Please bring your blood sugar monitor to each visit, thank you  Restart exercise  -** if am glucose > 140 , go up on lantus by 1 unit 2x day - keep that for 3 days, than adjust more if need.    QUICK START PATIENT GUIDE TO LCHF/IF LOW CARB HIGH FAT / INTERMITTENT FASTING  Recommend: <50 gram carbohydrate a day for weightloss.  What is this diet and how does it work? o Insulin is a hormone made by your body that allows you to use sugar (glucose) from carbohydrates in the food you eat for energy or to store glucose (as fat) for future use  o Insulin levels need to be lowered in order to utilize our stored energy (fat) o Many struggling with obesity are insulin resistant and have high levels of insulin o This diet works to lower your insulin in two ways o Fasting - allows your insulin levels to naturally decrease  o Avoiding carbohydrates - carbs trigger increase in insulin Low Carb Healthy Fat (LCHF) o Get a free app for your phone, such as MyFitnessPal, to help you track your macronutrients (carbs/protein/fats) and to track your weight and body measurements to see your progress o Set your goal for around 10% carbs/20% protein/70% fat o A good starting goal for amount of net carbs per day is 50 grams (some will aim for 20 grams) o "Net carbs" refers to total grams of carbs minus grams of fiber (as fiber is not typically absorbed). For example, if a food has 5g total carb and 3g fiber, that would be 2g net carbs o Increase healthy fats - eg. olive oil, eggs, nuts, avocado, cheese, butter, coconut, meats, fish o Avoid high carb foods - eg. bread, pasta, potatoes, rice, cookies, soda, juice, anything  sugary o Buy full-fat ingredients (avoid low-fat versions, which often have more sugar) o No need to count calories, but pay close attention to grams of carbs on labels Intermittent Fasting (IF) o "Fasting" is going a period of time without eating - it helps to stay busy and well-hydrated o Purpose of fasting is to allow insulin levels to drop as low as possible, allowing your body to switch into fat-burning mode o With this diet there are many approaches to fasting, but 16:8 and 24hr fasts are commonly used o 16:8 fast, usually 5-7 days a week - Fasting for 16 hours of the day, then eating all meals for the day over course of 8 hours. o 24 hour fast, usually 1-3 days a week - Typically eating one meal a day, then fasting until the next day. Plenty of fluids (and some salt to help you hold onto fluids) are recommended during longer fasts.  o During fasts certain beverages are still acceptable - water, sparkling water, bone broth, black tea or coffee, or tea/coffee with small amount of heavy whipping cream Special note for those on diabetic medications o Discuss your medications with your physician. You may need to hold your medication or adjust to only taking when eating. Diabetics should keep close track of their blood sugars when making any changes to diet/meds, to ensure they are  staying within normal limits For more info about LCHF/IF o Watch "Therapeutic Fasting - Solving the Two-Compartment Problem" video by Dr. Wylene Simmer on YouTube ([email or web address]) for a great intro to these concepts o Read "The Obesity Code" and/or "The Complete Guide to Fasting" by Dr. Wylene Simmer o Go to www.dietdoctor.com for explanations, recipes, and infographics about foods to eat/avoid o Get a Free smartphone app that helps count carbohydrates  - ie MyKeto EXAMPLES TO GET STARTED Fasting Beverages -water (can add  tsp Pink Himalayan salt once or twice a day to help stay hydrated for  longer fasts) -Sparkling water (such as [employer] or similar; avoid any with artificial sweeteners)  -Bone broth (multiple recipes available online or can buy pre-made) -Tea or Coffee (Adding heavy whipping cream or coconut oil to your tea or coffee can be helpful if you find yourself getting too hungry during the fasts. Can also add cinnamon for flavor. Or "bulletproof coffee.") Low Carb Healthy Fat Breakfast (if not fasting) -eggs in butter or olive oil with avocado -omelet with veggies and cheese  Lunch -hamburger with cheese and avocado wrapped in lettuce (no bun, no ketchup) -meat and cheese wrapped in lettuce (can dip in mustard or olive oil/vinegar/mayo) -salad with meat/cheese/nuts and higher fat dressing (vinaigrette or Ranch, etc) -tuna salad lettuce wrap -taco meat with cheese, sour cream, guacamole, cheese over lettuce  Dinner -steak with herb butter or Barnaise sauce -"Fathead" pizza (uses cheese and almond flour for the dough - several recipes available online) -roasted or grilled chicken with skin on, with low carb sauce (buffalo, garlic butter, alfredo, pesto, etc) -baked salmon with lemon butter -chicken alfredo with zucchini noodles -[place] butter chicken with low carb garlic naan -egg roll in a bowl  Side Dishes -mashed cauliflower (homemade or available in freezer section) -roast vegetables (green veggies that grow above ground rather than root veggies) with butter or cheese -Caprese salad (fresh mozzarella, tomato and basil with olive oil) -homemade low-carb coleslaw Snacks/Desserts (try to avoid unnecessary snacking and sweets in general) -celery or cucumber dipped in guacamole or sour cream dip -cheese and meat slices  -raspberries with whipped cream (can make homemade with no sugar added) -low carb [place] butter cake  AVOID - sugar, diet/regular soda, potatoes, breads, rice, pasta, candy, cookies, cakes, muffins, juice, high carb fruit (bananas, grapes),  beer, ketchup, barbeque and other sweet sauces  -  Back Pain, Adult Introduction Back pain is very common. The pain often gets better over time. The cause of back pain is usually not dangerous. Most people can learn to manage their back pain on their own. Follow these instructions at home: Watch your back pain for any changes. The following actions may help to lessen any pain you are feeling:  Stay active. Start with short walks on flat ground if you can. Try to walk farther each day.  Exercise regularly as told by your doctor. Exercise helps your back heal faster. It also helps avoid future injury by keeping your muscles strong and flexible.  Do not sit, drive, or stand in one place for more than 30 minutes.  Do not stay in bed. Resting more than 1-2 days can slow down your recovery.  Be careful when you bend or lift an object. Use good form when lifting:  Bend at your knees.  Keep the object close to your body.  Do not twist.  Sleep on a firm mattress. Lie on your side, and bend your knees. If you lie on  your back, put a pillow under your knees.  Take medicines only as told by your doctor.  Put ice on the injured area.  Put ice in a plastic bag.  Place a towel between your skin and the bag.  Leave the ice on for 20 minutes, 2-3 times a day for the first 2-3 days. After that, you can switch between ice and heat packs.  Avoid feeling anxious or stressed. Find good ways to deal with stress, such as exercise.  Maintain a healthy weight. Extra weight puts stress on your back. Contact a doctor if:  You have pain that does not go away with rest or medicine.  You have worsening pain that goes down into your legs or buttocks.  You have pain that does not get better in one week.  You have pain at night.  You lose weight.  You have a fever or chills. Get help right away if:  You cannot control when you poop (bowel movement) or pee (urinate).  Your arms or legs feel  weak.  Your arms or legs lose feeling (numbness).  You feel sick to your stomach (nauseous) or throw up (vomit).  You have belly (abdominal) pain.  You feel like you may pass out (faint). This information is not intended to replace advice given to you by your health care provider. Make sure you discuss any questions you have with your health care provider. Document Released: 05/25/2008 Document Revised: 05/14/2016 Document Reviewed: 04/10/2014  2017 Elsevier   -  Back Exercises Introduction If you have pain in your back, do these exercises 2-3 times each day or as told by your doctor. When the pain goes away, do the exercises once each day, but repeat the steps more times for each exercise (do more repetitions). If you do not have pain in your back, do these exercises once each day or as told by your doctor. Exercises Single Knee to Chest  Do these steps 3-5 times in a row for each leg: 1. Lie on your back on a firm bed or the floor with your legs stretched out. 2. Bring one knee to your chest. 3. Hold your knee to your chest by grabbing your knee or thigh. 4. Pull on your knee until you feel a gentle stretch in your lower back. 5. Keep doing the stretch for 10-30 seconds. 6. Slowly let go of your leg and straighten it. Pelvic Tilt  Do these steps 5-10 times in a row: 1. Lie on your back on a firm bed or the floor with your legs stretched out. 2. Bend your knees so they point up to the ceiling. Your feet should be flat on the floor. 3. Tighten your lower belly (abdomen) muscles to press your lower back against the floor. This will make your tailbone point up to the ceiling instead of pointing down to your feet or the floor. 4. Stay in this position for 5-10 seconds while you gently tighten your muscles and breathe evenly. Cat-Cow  Do these steps until your lower back bends more easily: 1. Get on your hands and knees on a firm surface. Keep your hands under your shoulders, and  keep your knees under your hips. You may put padding under your knees. 2. Let your head hang down, and make your tailbone point down to the floor so your lower back is round like the back of a cat. 3. Stay in this position for 5 seconds. 4. Slowly lift your head and make your  tailbone point up to the ceiling so your back hangs low (sags) like the back of a cow. 5. Stay in this position for 5 seconds. Press-Ups  Do these steps 5-10 times in a row: 1. Lie on your belly (face-down) on the floor. 2. Place your hands near your head, about shoulder-width apart. 3. While you keep your back relaxed and keep your hips on the floor, slowly straighten your arms to raise the top half of your body and lift your shoulders. Do not use your back muscles. To make yourself more comfortable, you may change where you place your hands. 4. Stay in this position for 5 seconds. 5. Slowly return to lying flat on the floor. Bridges  Do these steps 10 times in a row: 1. Lie on your back on a firm surface. 2. Bend your knees so they point up to the ceiling. Your feet should be flat on the floor. 3. Tighten your butt muscles and lift your butt off of the floor until your waist is almost as high as your knees. If you do not feel the muscles working in your butt and the back of your thighs, slide your feet 1-2 inches farther away from your butt. 4. Stay in this position for 3-5 seconds. 5. Slowly lower your butt to the floor, and let your butt muscles relax. If this exercise is too easy, try doing it with your arms crossed over your chest. Belly Crunches  Do these steps 5-10 times in a row: 1. Lie on your back on a firm bed or the floor with your legs stretched out. 2. Bend your knees so they point up to the ceiling. Your feet should be flat on the floor. 3. Cross your arms over your chest. 4. Tip your chin a little bit toward your chest but do not bend your neck. 5. Tighten your belly muscles and slowly raise your chest  just enough to lift your shoulder blades a tiny bit off of the floor. 6. Slowly lower your chest and your head to the floor. Back Lifts  Do these steps 5-10 times in a row: 1. Lie on your belly (face-down) with your arms at your sides, and rest your forehead on the floor. 2. Tighten the muscles in your legs and your butt. 3. Slowly lift your chest off of the floor while you keep your hips on the floor. Keep the back of your head in line with the curve in your back. Look at the floor while you do this. 4. Stay in this position for 3-5 seconds. 5. Slowly lower your chest and your face to the floor. Contact a doctor if:  Your back pain gets a lot worse when you do an exercise.  Your back pain does not lessen 2 hours after you exercise. If you have any of these problems, stop doing the exercises. Do not do them again unless your doctor says it is okay. Get help right away if:  You have sudden, very bad back pain. If this happens, stop doing the exercises. Do not do them again unless your doctor says it is okay. This information is not intended to replace advice given to you by your health care provider. Make sure you discuss any questions you have with your health care provider. Document Released: 01/09/2011 Document Revised: 05/14/2016 Document Reviewed: 01/31/2015  2017 Elsevier   - Diabetes Mellitus and Food It is important for you to manage your blood sugar (glucose) level. Your blood glucose level can  be greatly affected by what you eat. Eating healthier foods in the appropriate amounts throughout the day at about the same time each day will help you control your blood glucose level. It can also help slow or prevent worsening of your diabetes mellitus. Healthy eating may even help you improve the level of your blood pressure and reach or maintain a healthy weight. General recommendations for healthful eating and cooking habits include:  Eating meals and snacks regularly. Avoid going  long periods of time without eating to lose weight.  Eating a diet that consists mainly of plant-based foods, such as fruits, vegetables, nuts, legumes, and whole grains.  Using low-heat cooking methods, such as baking, instead of high-heat cooking methods, such as deep frying. Work with your dietitian to make sure you understand how to use the Nutrition Facts information on food labels. How can food affect me? Carbohydrates  Carbohydrates affect your blood glucose level more than any other type of food. Your dietitian will help you determine how many carbohydrates to eat at each meal and teach you how to count carbohydrates. Counting carbohydrates is important to keep your blood glucose at a healthy level, especially if you are using insulin or taking certain medicines for diabetes mellitus. Alcohol  Alcohol can cause sudden decreases in blood glucose (hypoglycemia), especially if you use insulin or take certain medicines for diabetes mellitus. Hypoglycemia can be a life-threatening condition. Symptoms of hypoglycemia (sleepiness, dizziness, and disorientation) are similar to symptoms of having too much alcohol. If your health care provider has given you approval to drink alcohol, do so in moderation and use the following guidelines:  Women should not have more than one drink per day, and men should not have more than two drinks per day. One drink is equal to:  12 oz of beer.  5 oz of wine.  1 oz of hard liquor.  Do not drink on an empty stomach.  Keep yourself hydrated. Have water, diet soda, or unsweetened iced tea.  Regular soda, juice, and other mixers might contain a lot of carbohydrates and should be counted. What foods are not recommended? As you make food choices, it is important to remember that all foods are not the same. Some foods have fewer nutrients per serving than other foods, even though they might have the same number of calories or carbohydrates. It is difficult to get  your body what it needs when you eat foods with fewer nutrients. Examples of foods that you should avoid that are high in calories and carbohydrates but low in nutrients include:  Trans fats (most processed foods list trans fats on the Nutrition Facts label).  Regular soda.  Juice.  Candy.  Sweets, such as cake, pie, doughnuts, and cookies.  Fried foods. What foods can I eat? Eat nutrient-rich foods, which will nourish your body and keep you healthy. The food you should eat also will depend on several factors, including:  The calories you need.  The medicines you take.  Your weight.  Your blood glucose level.  Your blood pressure level.  Your cholesterol level. You should eat a variety of foods, including:  Protein.  Lean cuts of meat.  Proteins low in saturated fats, such as fish, egg whites, and beans. Avoid processed meats.  Fruits and vegetables.  Fruits and vegetables that may help control blood glucose levels, such as apples, mangoes, and yams.  Dairy products.  Choose fat-free or low-fat dairy products, such as milk, yogurt, and cheese.  Grains, bread,  pasta, and rice.  Choose whole grain products, such as multigrain bread, whole oats, and brown rice. These foods may help control blood pressure.  Fats.  Foods containing healthful fats, such as nuts, avocado, olive oil, canola oil, and fish. Does everyone with diabetes mellitus have the same meal plan? Because every person with diabetes mellitus is different, there is not one meal plan that works for everyone. It is very important that you meet with a dietitian who will help you create a meal plan that is just right for you. This information is not intended to replace advice given to you by your health care provider. Make sure you discuss any questions you have with your health care provider. Document Released: 09/03/2005 Document Revised: 05/14/2016 Document Reviewed: 11/03/2013 [employer]vier Interactive  Patient Education  2017 [employer].

## 2016-11-03 NOTE — Progress Notes (Signed)
 Lindsay [name], is a 35 y.o. female  CSN:[id number]  MRN:[id number]  DOB - 08/05/1981  Chief Complaint  Patient presents with  . Back Pain  . Hand Pain        Subjective:   Lindsay [name] is a 35 y.o. female here today for a follow up visit, last seen  09/28/16, Recent hospitalization 9/17-9/20/17 for NSTEMI, 09/07/16 sp PCI/stent to mid LAD, PTCA to second diagonal ostium, ef 35-45% at time.  Echo 9/19 ef 55-60%.  Also hx of morbid obesity, BMI >58, ooc dm w/ neuropathy, on long term insulin, w/ diet noncompliance.    Cookies are her weakness.  On lantus 40bid.  Sugars in am for patient ranges 109-200s at times, mostly in 170s.   She has not tried the low carb diet I discussed last time w/ her for weight loss...states her roommate threw the paper away.  C/o of lower back pain today, chronic pain, as well as bilat hand pains. Her hands get very stiff throughout the day. Few days ago, was cooking, and she states her hands cramped up and had hard time opening them.     Patient has No headache, No chest pain, No abdominal pain - No Nausea, No new weakness tingling or numbness, No Cough - SOB.  No problems updated.  ALLERGIES: Allergies  Allergen Reactions  . Hydrazine Yellow [Tartrazine] Shortness Of Breath and Swelling    Swelling mostly noticed in legs and feet, retaining urination, shortness of breaht, and minor chest pain  . Lisinopril Shortness Of Breath    Was on prinzide; had sob/chest pain on it.  . Tylenol [Acetaminophen] Itching and Swelling    Itching of the mouth, swelling of tongue and stomach started hurting    PAST MEDICAL HISTORY: Past Medical History:  Diagnosis Date  . ARF (acute renal failure) (HCC) 04/2015  . Asthma   . Cellulitis of right upper extremity   . Coronary artery disease   . Diabetes mellitus    insulin dependent  . Hyperlipidemia LDL goal <70   . Hypertension   . NSTEMI (non-ST elevated myocardial infarction) (HCC) 08/2016  . Obesity    . S/P angioplasty with stent 08/2016   DES to mLAD and PTCA only to 2nd diag ostium.     MEDICATIONS AT HOME: Prior to Admission medications   Medication Sig Start Date End Date Taking? Authorizing Provider  ACCU-CHEK SOFTCLIX LANCETS lancets Use as instructed for 4 times daily blood glucose monitoring 06/23/16  Yes  T , MD  albuterol (PROVENTIL HFA;VENTOLIN HFA) 108 (90 Base) MCG/ACT inhaler Inhale 2 puffs into the lungs every 6 (six) hours as needed for wheezing or shortness of breath. 06/10/16  Yes  T , MD  amLODipine (NORVASC) 10 MG tablet Take 1 tablet (10 mg total) by mouth daily. 06/10/16  Yes  T , MD  aspirin 81 MG tablet Take 81 mg by mouth daily.   Yes Historical Provider, MD  atorvastatin (LIPITOR) 80 MG tablet Take 1 tablet (80 mg total) by mouth daily at 6 PM. 09/09/16  Yes Bhavinkumar Bhagat, PA  Blood Glucose Monitoring Suppl (ACCU-CHEK AVIVA PLUS) w/Device KIT Use as directed for 4 times daily blood glucose monitoring 06/23/16  Yes  T , MD  gabapentin (NEURONTIN) 300 MG capsule Take 2 capsules (600 mg total) by mouth 4 (four) times daily. 11/03/16  Yes  T , MD  glucose blood (ACCU-CHEK AVIVA PLUS) test strip Use as instructed for 4 times daily blood glucose monitoring    Lindsay [name], is a 35 y.o. female  CSN:[id number]  MRN:[id number]  DOB - 08/05/1981  Chief Complaint  Patient presents with  . Back Pain  . Hand Pain        Subjective:   Lindsay [name] is a 35 y.o. female here today for a follow up visit, last seen  09/28/16, Recent hospitalization 9/17-9/20/17 for NSTEMI, 09/07/16 sp PCI/stent to mid LAD, PTCA to second diagonal ostium, ef 35-45% at time.  Echo 9/19 ef 55-60%.  Also hx of morbid obesity, BMI >58, ooc dm w/ neuropathy, on long term insulin, w/ diet noncompliance.    Cookies are her weakness.  On lantus 40bid.  Sugars in am for patient ranges 109-200s at times, mostly in 170s.   She has not tried the low carb diet I discussed last time w/ her for weight loss...states her roommate threw the paper away.  C/o of lower back pain today, chronic pain, as well as bilat hand pains. Her hands get very stiff throughout the day. Few days ago, was cooking, and she states her hands cramped up and had hard time opening them.     Patient has No headache, No chest pain, No abdominal pain - No Nausea, No new weakness tingling or numbness, No Cough - SOB.  No problems updated.  ALLERGIES: Allergies  Allergen Reactions  . Hydrazine Yellow [Tartrazine] Shortness Of Breath and Swelling    Swelling mostly noticed in legs and feet, retaining urination, shortness of breaht, and minor chest pain  . Lisinopril Shortness Of Breath    Was on prinzide; had sob/chest pain on it.  . Tylenol [Acetaminophen] Itching and Swelling    Itching of the mouth, swelling of tongue and stomach started hurting    PAST MEDICAL HISTORY: Past Medical History:  Diagnosis Date  . ARF (acute renal failure) (HCC) 04/2015  . Asthma   . Cellulitis of right upper extremity   . Coronary artery disease   . Diabetes mellitus    insulin dependent  . Hyperlipidemia LDL goal <70   . Hypertension   . NSTEMI (non-ST elevated myocardial infarction) (HCC) 08/2016  . Obesity    . S/P angioplasty with stent 08/2016   DES to mLAD and PTCA only to 2nd diag ostium.     MEDICATIONS AT HOME: Prior to Admission medications   Medication Sig Start Date End Date Taking? Authorizing Provider  ACCU-CHEK SOFTCLIX LANCETS lancets Use as instructed for 4 times daily blood glucose monitoring 06/23/16  Yes Perel Hauschild T Kilah Drahos, MD  albuterol (PROVENTIL HFA;VENTOLIN HFA) 108 (90 Base) MCG/ACT inhaler Inhale 2 puffs into the lungs every 6 (six) hours as needed for wheezing or shortness of breath. 06/10/16  Yes Yuya Vanwingerden T Keary Hanak, MD  amLODipine (NORVASC) 10 MG tablet Take 1 tablet (10 mg total) by mouth daily. 06/10/16  Yes Kensey Luepke T Chaska Hagger, MD  aspirin 81 MG tablet Take 81 mg by mouth daily.   Yes Historical Provider, MD  atorvastatin (LIPITOR) 80 MG tablet Take 1 tablet (80 mg total) by mouth daily at 6 PM. 09/09/16  Yes Bhavinkumar Bhagat, PA  Blood Glucose Monitoring Suppl (ACCU-CHEK AVIVA PLUS) w/Device KIT Use as directed for 4 times daily blood glucose monitoring 06/23/16  Yes Murdis [name] T Aryelle Figg, MD  gabapentin (NEURONTIN) 300 MG capsule Take 2 capsules (600 mg total) by mouth 4 (four) times daily. 11/03/16  Yes Marrion Accomando T Bern Fare, MD  glucose blood (ACCU-CHEK AVIVA PLUS) test strip Use as instructed for 4 times daily blood glucose monitoring    Lindsay [name], is a 35 y.o. female  CSN:[id number]  MRN:[id number]  DOB - 08/05/1981  Chief Complaint  Patient presents with  . Back Pain  . Hand Pain        Subjective:   Lindsay [name] is a 35 y.o. female here today for a follow up visit, last seen  09/28/16, Recent hospitalization 9/17-9/20/17 for NSTEMI, 09/07/16 sp PCI/stent to mid LAD, PTCA to second diagonal ostium, ef 35-45% at time.  Echo 9/19 ef 55-60%.  Also hx of morbid obesity, BMI >58, ooc dm w/ neuropathy, on long term insulin, w/ diet noncompliance.    Cookies are her weakness.  On lantus 40bid.  Sugars in am for patient ranges 109-200s at times, mostly in 170s.   She has not tried the low carb diet I discussed last time w/ her for weight loss...states her roommate threw the paper away.  C/o of lower back pain today, chronic pain, as well as bilat hand pains. Her hands get very stiff throughout the day. Few days ago, was cooking, and she states her hands cramped up and had hard time opening them.     Patient has No headache, No chest pain, No abdominal pain - No Nausea, No new weakness tingling or numbness, No Cough - SOB.  No problems updated.  ALLERGIES: Allergies  Allergen Reactions  . Hydrazine Yellow [Tartrazine] Shortness Of Breath and Swelling    Swelling mostly noticed in legs and feet, retaining urination, shortness of breaht, and minor chest pain  . Lisinopril Shortness Of Breath    Was on prinzide; had sob/chest pain on it.  . Tylenol [Acetaminophen] Itching and Swelling    Itching of the mouth, swelling of tongue and stomach started hurting    PAST MEDICAL HISTORY: Past Medical History:  Diagnosis Date  . ARF (acute renal failure) (HCC) 04/2015  . Asthma   . Cellulitis of right upper extremity   . Coronary artery disease   . Diabetes mellitus    insulin dependent  . Hyperlipidemia LDL goal <70   . Hypertension   . NSTEMI (non-ST elevated myocardial infarction) (HCC) 08/2016  . Obesity    . S/P angioplasty with stent 08/2016   DES to mLAD and PTCA only to 2nd diag ostium.     MEDICATIONS AT HOME: Prior to Admission medications   Medication Sig Start Date End Date Taking? Authorizing Provider  ACCU-CHEK SOFTCLIX LANCETS lancets Use as instructed for 4 times daily blood glucose monitoring 06/23/16  Yes Dawn T Langeland, MD  albuterol (PROVENTIL HFA;VENTOLIN HFA) 108 (90 Base) MCG/ACT inhaler Inhale 2 puffs into the lungs every 6 (six) hours as needed for wheezing or shortness of breath. 06/10/16  Yes Dawn T Langeland, MD  amLODipine (NORVASC) 10 MG tablet Take 1 tablet (10 mg total) by mouth daily. 06/10/16  Yes Dawn T Langeland, MD  aspirin 81 MG tablet Take 81 mg by mouth daily.   Yes Historical Provider, MD  atorvastatin (LIPITOR) 80 MG tablet Take 1 tablet (80 mg total) by mouth daily at 6 PM. 09/09/16  Yes Bhavinkumar Bhagat, PA  Blood Glucose Monitoring Suppl (ACCU-CHEK AVIVA PLUS) w/Device KIT Use as directed for 4 times daily blood glucose monitoring 06/23/16  Yes Dawn T Langeland, MD  gabapentin (NEURONTIN) 300 MG capsule Take 2 capsules (600 mg total) by mouth 4 (four) times daily. 11/03/16  Yes Dawn T Langeland, MD  glucose blood (ACCU-CHEK AVIVA PLUS) test strip Use as instructed for 4 times daily blood glucose monitoring

## 2016-11-04 LAB — ANA, IFA COMPREHENSIVE PANEL
Anti Nuclear Antibody(ANA): NEGATIVE
ENA SM AB SER-ACNC: NEGATIVE
SCLERODERMA (SCL-70) (ENA) ANTIBODY, IGG: NEGATIVE
SM/RNP: NEGATIVE
SSA (RO) (ENA) ANTIBODY, IGG: NEGATIVE
SSB (LA) (ENA) ANTIBODY, IGG: NEGATIVE
ds DNA Ab: <1 IU/mL

## 2016-11-04 LAB — RHEUMATOID FACTOR

## 2016-11-05 ENCOUNTER — Encounter (HOSPITAL_COMMUNITY): Payer: Self-pay

## 2016-11-05 ENCOUNTER — Ambulatory Visit (HOSPITAL_COMMUNITY)
Admission: RE | Admit: 2016-11-05 | Discharge: 2016-11-05 | Disposition: A | Discharge status: Home / Self Care | Payer: Medicaid Other | Source: Ambulatory Visit | Attending: Internal Medicine | Admitting: Internal Medicine

## 2016-11-05 DIAGNOSIS — M5136 Other intervertebral disc degeneration, lumbar region: Secondary | ICD-10-CM | POA: Insufficient documentation

## 2016-11-05 DIAGNOSIS — G8929 Other chronic pain: Secondary | ICD-10-CM | POA: Insufficient documentation

## 2016-11-05 DIAGNOSIS — M79642 Pain in left hand: Secondary | ICD-10-CM | POA: Insufficient documentation

## 2016-11-05 DIAGNOSIS — M79641 Pain in right hand: Secondary | ICD-10-CM | POA: Insufficient documentation

## 2016-11-05 DIAGNOSIS — M545 Low back pain: Secondary | ICD-10-CM | POA: Insufficient documentation

## 2016-11-11 ENCOUNTER — Telehealth: Payer: Self-pay

## 2016-11-11 NOTE — Telephone Encounter (Signed)
Pt is aware of xrays results and doesn't have any questions or concerns

## 2016-11-11 NOTE — Telephone Encounter (Signed)
Contacted pt to go over lab results pt is aware of results and doesn't have any questions or concerns 

## 2016-11-20 ENCOUNTER — Encounter: Payer: Self-pay | Admitting: Internal Medicine

## 2016-11-20 LAB — HM DIABETES EYE EXAM

## 2016-11-21 ENCOUNTER — Other Ambulatory Visit: Payer: Self-pay | Admitting: Internal Medicine

## 2016-11-26 ENCOUNTER — Telehealth: Payer: Self-pay | Admitting: Interventional Cardiology

## 2016-11-26 NOTE — Telephone Encounter (Signed)
Clearance faxed to Dr. Ardelle Park office.

## 2016-11-26 NOTE — Telephone Encounter (Signed)
New message      What dental office are you calling from?  Dr Berneda Rose What is your office phone and fax number? Fax [phone] What type of procedure is the patient having performed? regular cleaning 1. What date is procedure scheduled? 11-27-16  2. What is your question (ex. Antibiotics prior to procedure, holding medication-we need to know how long dentist wants pt to hold med)?  Calling to see if pt needs antibiotics prior to cleaning

## 2016-11-26 NOTE — Telephone Encounter (Signed)
No antibiotic should be needed. Please do not hold antiplatelet therapy until 3 months out from procedure.

## 2016-12-07 ENCOUNTER — Ambulatory Visit (INDEPENDENT_AMBULATORY_CARE_PROVIDER_SITE_OTHER): Payer: Medicaid Other | Admitting: Interventional Cardiology

## 2016-12-07 ENCOUNTER — Encounter: Payer: Self-pay | Admitting: Interventional Cardiology

## 2016-12-07 VITALS — BP 144/93 | HR 81 | Ht 68.0 in | Wt 388.0 lb

## 2016-12-07 DIAGNOSIS — E785 Hyperlipidemia, unspecified: Secondary | ICD-10-CM

## 2016-12-07 DIAGNOSIS — I10 Essential (primary) hypertension: Secondary | ICD-10-CM | POA: Diagnosis not present

## 2016-12-07 DIAGNOSIS — IMO0001 Reserved for inherently not codable concepts without codable children: Secondary | ICD-10-CM

## 2016-12-07 DIAGNOSIS — E1122 Type 2 diabetes mellitus with diabetic chronic kidney disease: Secondary | ICD-10-CM | POA: Diagnosis not present

## 2016-12-07 DIAGNOSIS — N181 Chronic kidney disease, stage 1: Secondary | ICD-10-CM

## 2016-12-07 DIAGNOSIS — G4733 Obstructive sleep apnea (adult) (pediatric): Secondary | ICD-10-CM | POA: Diagnosis not present

## 2016-12-07 DIAGNOSIS — I2510 Atherosclerotic heart disease of native coronary artery without angina pectoris: Secondary | ICD-10-CM | POA: Diagnosis not present

## 2016-12-07 DIAGNOSIS — Z6841 Body Mass Index (BMI) 40.0 and over, adult: Secondary | ICD-10-CM

## 2016-12-07 DIAGNOSIS — E6609 Other obesity due to excess calories: Secondary | ICD-10-CM

## 2016-12-07 NOTE — Patient Instructions (Signed)
Medication Instructions:  None  Labwork: None  Testing/Procedures: None  Follow-Up: Your physician wants you to follow-up in: 4 months with Dr. Smith.  You will receive a reminder letter in the mail two months in advance. If you don't receive a letter, please call our office to schedule the follow-up appointment.   Any Other Special Instructions Will Be Listed Below (If Applicable).     If you need a refill on your cardiac medications before your next appointment, please call your pharmacy.   

## 2016-12-07 NOTE — Progress Notes (Signed)
Cardiology Office Note    Date:  12/07/2016   ID:  Lindsay [name], DOB 09-16-1981, MRN [id number]  PCP:  Lindsay Reamer, MD  Cardiologist: Lindsay Grooms, MD   Chief Complaint  Patient presents with  . Coronary Artery Disease    History of Present Illness:  Lindsay [name] is a 35 y.o. female who presents for hospital follow up for NSTEMI, with pk troponin 6.64 with cath and PCI with DES to mLAD and PTCA only to 2nd diag ostium. Still with 50% residual stenosis.  EF with cath 35-45%but Echo was 55-60%.  On ASA, Brilinta, BB ARB and Statin,  She is having lower back discomfort. She had anterior non-ST elevation MI and underwent coronary stent implantation. She has diabetes with very poor control. She is morbidly obese. Has a family history of end-stage kidney disease and lower extremity amputations related to poorly controlled diabetes.  She has been compliant with her medical regimen. She denies medication side effects.  Past Medical History:  Diagnosis Date  . ARF (acute renal failure) ([facility]) 04/2015  . Asthma   . Cellulitis of right upper extremity   . Coronary artery disease   . Diabetes mellitus    insulin dependent  . Hyperlipidemia LDL goal <70   . Hypertension   . NSTEMI (non-ST elevated myocardial infarction) ([facility]) 08/2016  . Obesity   . S/P angioplasty with stent 08/2016   DES to mLAD and PTCA only to 2nd diag ostium.     Past Surgical History:  Procedure Laterality Date  . CARDIAC CATHETERIZATION N/A 09/07/2016   Procedure: Left Heart Cath and Coronary Angiography;  Surgeon: Lindsay Crome, MD;  Location: [facility];  Service: Cardiovascular;  Laterality: N/A;  . CARDIAC CATHETERIZATION N/A 09/07/2016   Procedure: Coronary Stent Intervention;  Surgeon: Lindsay Crome, MD;  Location: [facility];  Service: Cardiovascular;  Laterality: N/A;  . CARDIAC CATHETERIZATION N/A 09/07/2016   Procedure: Coronary Balloon Angioplasty;  Surgeon: Lindsay Crome, MD;  Location: [facility];  Service: Cardiovascular;  Laterality: N/A;  . CESAREAN SECTION    . IRRIGATION AND DEBRIDEMENT SHOULDER Right 04/30/2015   Procedure: IRRIGATION AND DEBRIDEMENT SHOULDER;  Surgeon: Lindsay Butters, MD;  Location: [facility];  Service: Orthopedics;  Laterality: Right;  . IRRIGATION AND DEBRIDEMENT SHOULDER Right 05/01/2015  . LEG SURGERY    . SHOULDER ARTHROSCOPY Right 04/30/2015   Procedure: ARTHROSCOPY SHOULDER;  Surgeon: Lindsay Butters, MD;  Location: [facility];  Service: Orthopedics;  Laterality: Right;  . TONSILLECTOMY      Current Medications: Outpatient Medications Prior to Visit  Medication Sig Dispense Refill  . ACCU-CHEK SOFTCLIX LANCETS lancets Use as instructed for 4 times daily blood glucose monitoring 100 each 12  . albuterol (PROVENTIL HFA;VENTOLIN HFA) 108 (90 Base) MCG/ACT inhaler Inhale 2 puffs into the lungs every 6 (six) hours as needed for wheezing or shortness of breath. 1 Inhaler 2  . amLODipine (NORVASC) 10 MG tablet Take 1 tablet (10 mg total) by mouth daily. 90 tablet 3  . aspirin 81 MG tablet Take 81 mg by mouth daily.    Marland Kitchen atorvastatin (LIPITOR) 80 MG tablet Take 1 tablet (80 mg total) by mouth daily at 6 PM. 30 tablet 6  . Blood Glucose Monitoring Suppl (ACCU-CHEK AVIVA PLUS) w/Device KIT Use as directed for 4 times daily blood glucose monitoring 1 kit 0  . gabapentin (NEURONTIN) 300 MG capsule Take 2 capsules (  600 mg total) by mouth 4 (four) times daily. 240 capsule 3  . glucose blood (ACCU-CHEK AVIVA PLUS) test strip Use as instructed for 4 times daily blood glucose monitoring 100 each 12  . insulin glargine (LANTUS) 100 unit/mL SOPN Inject 0.42 mLs (42 Units total) into the skin 2 (two) times daily. 15 pen 3  . Insulin Pen Needle 31G X 5 MM MISC 1 application by Does not apply route Nightly. 1000 each 3  . Lancet Devices (ACCU-CHEK SOFTCLIX) lancets Use as instructed for 4 times daily blood glucose monitoring 1 each 0  .  losartan (COZAAR) 25 MG tablet Take 1 tablet (25 mg total) by mouth daily. 30 tablet 6  . medroxyPROGESTERone (DEPO-PROVERA) 150 MG/ML injection Inject 150 mg into the muscle every 3 (three) months.    . meloxicam (MOBIC) 15 MG tablet Take 1 tablet (15 mg total) by mouth daily. 30 tablet 0  . metFORMIN (GLUCOPHAGE) 1000 MG tablet Take 1 tablet (1,000 mg total) by mouth 2 (two) times daily with a meal. 180 tablet 3  . methocarbamol (ROBAXIN) 500 MG tablet Take 1 tablet (500 mg total) by mouth 4 (four) times daily. 120 tablet 2  . metoprolol tartrate (LOPRESSOR) 50 MG tablet Take 1 tablet (50 mg total) by mouth 2 (two) times daily. 120 tablet 3  . nitroGLYCERIN (NITROSTAT) 0.4 MG SL tablet Place 1 tablet (0.4 mg total) under the tongue every 5 (five) minutes as needed for chest pain. 25 tablet 12  . NOVOLOG FLEXPEN 100 UNIT/ML FlexPen INJECT 10 UNITS SUBCUTANEOUSLY THREE TIMES DAILY WITH MEALS 15 mL 3  . ticagrelor (BRILINTA) 90 MG TABS tablet Take 1 tablet (90 mg total) by mouth 2 (two) times daily. 60 tablet 11  . cephALEXin (KEFLEX) 500 MG capsule Take 1 capsule (500 mg total) by mouth 4 (four) times daily. (Patient not taking: Reported on 12/07/2016) 28 capsule 0  . ferrous sulfate 325 (65 FE) MG tablet Take 1 tablet (325 mg total) by mouth 2 (two) times daily with a meal. (Patient not taking: Reported on 12/07/2016) 60 tablet 3  . glucose blood (ACCU-CHEK AVIVA) test strip Use as instructed (Patient not taking: Reported on 12/07/2016) 100 each 12   No facility-administered medications prior to visit.      Allergies:   Hydrazine yellow [tartrazine]; Lisinopril; and Tylenol [acetaminophen]   Social History   Social History  . Marital status: Single    Spouse name: N/A  . Number of children: N/A  . Years of education: N/A   Social History Main Topics  . Smoking status: Never Smoker  . Smokeless tobacco: Never Used  . Alcohol use Yes     Comment: socially  . Drug use: No  . Sexual  activity: Not Asked   Other Topics Concern  . None   Social History Narrative  . None     Family History:  The patient's family history includes Cancer in her maternal grandmother; Diabetes in her father and mother; Heart disease in her father and mother; Hypertension in her mother; Stroke in her maternal grandmother.   ROS:   Please see the history of present illness.    Atypical chest pain. Occasional lower extremity swelling. She awakens at night short of breath. Occasional vision disturbance, back pain, anxiety, headaches, and wheezing.  All other systems reviewed and are negative.   PHYSICAL EXAM:   VS:  BP (!) 144/93 (BP Location: Left Arm)   Pulse 81   Ht 5' 8"  (1.727  m)   Wt (!) 388 lb (176 kg)   BMI 59.00 kg/m    GEN: Well nourished, well developed, in no acute distress . Morbidly obese. HEENT: normal  Neck: no JVD, carotid bruits, or masses Cardiac: RRR; no murmurs, rubs, or gallops,no edema  Respiratory:  clear to auscultation bilaterally, normal work of breathing GI: soft, nontender, nondistended, + BS MS: no deformity or atrophy  Skin: warm and dry, no rash Neuro:  Alert and Oriented x 3, Strength and sensation are intact Psych: euthymic mood, full affect  Wt Readings from Last 3 Encounters:  12/07/16 (!) 388 lb (176 kg)  11/03/16 (!) 376 lb (170.6 kg)  10/05/16 (!) 383 lb (173.7 kg)      Studies/Labs Reviewed:   EKG:  EKG  EKG is not repeated.  Recent Labs: 09/16/2016: BUN 16; Creat 0.71; Hemoglobin 11.2; Platelets 313; Potassium 4.5; Sodium 137 10/28/2016: ALT 17   Lipid Panel    Component Value Date/Time   CHOL 124 10/28/2016 0856   TRIG 174 (H) 10/28/2016 0856   HDL 35 (L) 10/28/2016 0856   CHOLHDL 3.5 10/28/2016 0856   VLDL 35 (H) 10/28/2016 0856   LDLCALC 54 10/28/2016 0856    Additional studies/ records that were reviewed today include:  Cardiac catheterization and intervention performed 08/28/16:  Diagnostic Diagram       Post-Intervention Diagram        ASSESSMENT:    1. CAD in native artery   2. Essential hypertension   3. OSA (obstructive sleep apnea)   4. Type 2 diabetes mellitus with stage 1 chronic kidney disease, unspecified long term insulin use status (HCC)   5. Class 3 obesity due to excess calories with serious comorbidity and body mass index (BMI) of 50.0 to 59.9 in adult ([place])   6. Hyperlipidemia LDL goal <70      PLAN:  In order of problems listed above:  1. No cardiac complaints. Relatively inactive. To start Phase 2 cardiac rehabilitation in the next week. Continue dual antiplatelet therapy. Encouraged aerobic activity. 2. Weight loss and 2 g sodium diet as advocated. 3. Continue C Pap.. 4. Hopefully cardiac rehabilitation and the increased activity will help her better control blood sugars. I did discuss the possibility of bariatric surgery. 5. We discussed bariatric surgery. She would like to get more information concerning this. 6. LDL was at target, less than 70 when evaluated in November.  Four-month follow-up. Phase II cardiac rehabilitation. Increase aerobic activity.    Medication Adjustments/Labs and Tests Ordered: Current medicines are reviewed at length with the patient today.  Concerns regarding medicines are outlined above.  Medication changes, Labs and Tests ordered today are listed in the Patient Instructions below. Patient Instructions  Medication Instructions:  None  Labwork: None  Testing/Procedures: None  Follow-Up: Your physician wants you to follow-up in: 4 months with Dr. [name].  You will receive a reminder letter in the mail two months in advance. If you don't receive a letter, please call our office to schedule the follow-up appointment.   Any Other Special Instructions Will Be Listed Below (If Applicable).     If you need a refill on your cardiac medications before your next appointment, please call your pharmacy.      Signed, Lindsay Grooms, MD  12/07/2016 5:20 PM    [facility] [place], [place], [place]  [address] Phone: [phone]; Fax: [phone]

## 2016-12-08 ENCOUNTER — Ambulatory Visit: Payer: Medicaid Other | Admitting: Pharmacist

## 2016-12-08 NOTE — Progress Notes (Deleted)
    S:    Patient arrives in good spirits.  Presents for diabetes evaluation, education, and management at the request of Dr. Julien Nordmann. Patient was referred on 11/03/16.  Patient was last seen by Primary Care Provider on 11/03/16.   Patient {Actions; denies-reports:120008} adherence with medications.  Current diabetes medications include: Lantus 42 units BID and metformin 1000 mg BID. Current hypertension medications include:   Patient {Actions; denies-reports:120008} hypoglycemic events.  Patient reported dietary habits: Eats *** meals/day Breakfast:*** Lunch:*** Dinner:*** Snacks:*** Drinks:***  Patient reported exercise habits:    Patient {Actions; denies-reports:120008} nocturia.  Patient {Actions; denies-reports:120008} neuropathy. Patient {Actions; denies-reports:120008} visual changes. Patient {Actions; denies-reports:120008} self foot exams.    O:  Lab Results  Component Value Date   HGBA1C 12.4 (H) 09/08/2016   There were no vitals filed for this visit.  Home fasting CBG: ***  2 hour post-prandial/random CBG: ***.  10 year ASCVD risk: ***.  A/P: Diabetes longstanding/newly diagnosed currently ***. Patient {Actions; denies-reports:120008} hypoglycemic events and is able to verbalize appropriate hypoglycemia management plan. Patient {Actions; denies-reports:120008} adherence with medication. Control is suboptimal due to ***.  PICK 1 OF THE FOLLOWING 4 (DELETE the others AND THEN DELETE THIS LINE OF TEXT)  {Meds adjust:18428} basal insulin Lantus (insulin glargine). Patient will continue to titrate 1 unit/day if fasting CBGs > 100mg /dl until fasting CBGs reach goal or next visit.    {Meds adjust:18428} basal insulin Lantus (insulin glargine) to ***. {Meds adjust:18428} rapid insulin Novolog (insulin aspart) to ***.   Next A1C anticipated ***.    ASCVD risk greater than 7.5%. {Meds adjust:18428} Aspirin *** mg and {Meds adjust:18428} ***statin *** mg.    Hypertension longstanding/newly diagnosed currently ***.  Patient {Actions; denies-reports:120008} adherence with medication. Control is suboptimal due to ***.  Written patient instructions provided.  Total time in face to face counseling *** minutes.   Follow up in Pharmacist Clinic Visit ***.

## 2016-12-09 ENCOUNTER — Other Ambulatory Visit: Payer: Self-pay | Admitting: Internal Medicine

## 2016-12-10 ENCOUNTER — Encounter (HOSPITAL_COMMUNITY)
Admission: RE | Admit: 2016-12-10 | Discharge: 2016-12-10 | Disposition: A | Discharge status: Home / Self Care | Payer: Medicaid Other | Source: Ambulatory Visit | Attending: Interventional Cardiology | Admitting: Interventional Cardiology

## 2016-12-10 ENCOUNTER — Encounter (HOSPITAL_COMMUNITY): Payer: Self-pay

## 2016-12-10 VITALS — BP 153/97 | HR 99 | Ht 67.5 in | Wt 389.8 lb

## 2016-12-10 DIAGNOSIS — I219 Acute myocardial infarction, unspecified: Secondary | ICD-10-CM | POA: Insufficient documentation

## 2016-12-10 DIAGNOSIS — I214 Non-ST elevation (NSTEMI) myocardial infarction: Secondary | ICD-10-CM

## 2016-12-10 DIAGNOSIS — Z955 Presence of coronary angioplasty implant and graft: Secondary | ICD-10-CM | POA: Insufficient documentation

## 2016-12-10 NOTE — Progress Notes (Signed)
Cardiac Rehab Medication Review by a Pharmacist  Does the patient  feel that his/her medications are working for him/her?  yes  Has the patient been experiencing any side effects to the medications prescribed?  yes  Does the patient measure his/her own blood pressure or blood glucose at home? Monitors blood glucose but not blood pressure.   Does the patient have any problems obtaining medications due to transportation or finances?   no  Understanding of regimen: good Understanding of indications: good Potential of compliance: good    Pharmacist comments: Lindsay [name] is a 35 year old female who presents to cardiac rehab today. She seems to have a good understanding of her medication regimen. She did say that her metformin gives her diarrhea, but it is not very severe and she and her provider have decided to continue the medication. Recommended that she let her provider know if it gets worse or becomes bothersome.  Lindsay [name], PharmD Acute Care Pharmacy Resident  Pager: [phone] 12/10/2016

## 2016-12-10 NOTE — Progress Notes (Signed)
Cardiac Individual Treatment Plan  Patient Details  Name: Lindsay [name] MRN: [id number] Date of Birth: February 09, 1981 Referring Provider:   Flowsheet Row CARDIAC REHAB PHASE II ORIENTATION from 12/10/2016 in [facility]  Referring Provider  Daneen Schick MD      Initial Encounter Date:  [facility] PHASE II ORIENTATION from 12/10/2016 in [facility] [facility]  Date  12/10/16  Referring Provider  Daneen Schick MD      Visit Diagnosis: 09/06/16 NSTEMI (non-ST elevated myocardial infarction) [facility])  Patient's Home Medications on Admission:  Current Outpatient Prescriptions:  .  ACCU-CHEK SOFTCLIX LANCETS lancets, Use as instructed for 4 times daily blood glucose monitoring, Disp: 100 each, Rfl: 12 .  albuterol (PROVENTIL HFA;VENTOLIN HFA) 108 (90 Base) MCG/ACT inhaler, Inhale 2 puffs into the lungs every 6 (six) hours as needed for wheezing or shortness of breath., Disp: 1 Inhaler, Rfl: 2 .  amLODipine (NORVASC) 10 MG tablet, Take 1 tablet (10 mg total) by mouth daily., Disp: 90 tablet, Rfl: 3 .  aspirin 81 MG tablet, Take 81 mg by mouth daily., Disp: , Rfl:  .  atorvastatin (LIPITOR) 80 MG tablet, Take 1 tablet (80 mg total) by mouth daily at 6 PM., Disp: 30 tablet, Rfl: 6 .  Blood Glucose Monitoring Suppl (ACCU-CHEK AVIVA PLUS) w/Device KIT, Use as directed for 4 times daily blood glucose monitoring, Disp: 1 kit, Rfl: 0 .  gabapentin (NEURONTIN) 300 MG capsule, Take 2 capsules (600 mg total) by mouth 4 (four) times daily., Disp: 240 capsule, Rfl: 3 .  glucose blood (ACCU-CHEK AVIVA PLUS) test strip, Use as instructed for 4 times daily blood glucose monitoring, Disp: 100 each, Rfl: 12 .  insulin glargine (LANTUS) 100 unit/mL SOPN, Inject 0.42 mLs (42 Units total) into the skin 2 (two) times daily. (Patient taking differently: Inject 55 Units into the skin 2 (two) times daily. ), Disp: 15 pen, Rfl: 3 .  Insulin Pen Needle 31G  X 5 MM MISC, 1 application by Does not apply route Nightly., Disp: 1000 each, Rfl: 3 .  Lancet Devices (ACCU-CHEK SOFTCLIX) lancets, Use as instructed for 4 times daily blood glucose monitoring, Disp: 1 each, Rfl: 0 .  losartan (COZAAR) 25 MG tablet, Take 1 tablet (25 mg total) by mouth daily., Disp: 30 tablet, Rfl: 6 .  medroxyPROGESTERone (DEPO-PROVERA) 150 MG/ML injection, Inject 150 mg into the muscle every 3 (three) months., Disp: , Rfl:  .  meloxicam (MOBIC) 15 MG tablet, TAKE ONE TABLET BY MOUTH ONCE DAILY, Disp: 30 tablet, Rfl: 0 .  metFORMIN (GLUCOPHAGE) 1000 MG tablet, Take 1 tablet (1,000 mg total) by mouth 2 (two) times daily with a meal., Disp: 180 tablet, Rfl: 3 .  methocarbamol (ROBAXIN) 500 MG tablet, Take 1 tablet (500 mg total) by mouth 4 (four) times daily., Disp: 120 tablet, Rfl: 2 .  metoprolol tartrate (LOPRESSOR) 50 MG tablet, Take 1 tablet (50 mg total) by mouth 2 (two) times daily., Disp: 120 tablet, Rfl: 3 .  nitroGLYCERIN (NITROSTAT) 0.4 MG SL tablet, Place 1 tablet (0.4 mg total) under the tongue every 5 (five) minutes as needed for chest pain., Disp: 25 tablet, Rfl: 12 .  NOVOLOG FLEXPEN 100 UNIT/ML FlexPen, INJECT 10 UNITS SUBCUTANEOUSLY THREE TIMES DAILY WITH MEALS (Patient taking differently: INJECT 10 UNITS SUBCUTANEOUSLY THREE TIMES DAILY WITH MEALS (Takes 10 units plus extra on sliding scale for carb coverage)), Disp: 15 mL, Rfl: 3 .  Prenatal Vit-Fe Fumarate-FA (PRENATAL  MULTIVITAMIN/IRON PO), Take 1 tablet by mouth daily., Disp: , Rfl:  .  ticagrelor (BRILINTA) 90 MG TABS tablet, Take 1 tablet (90 mg total) by mouth 2 (two) times daily., Disp: 60 tablet, Rfl: 11  Past Medical History: Past Medical History:  Diagnosis Date  . ARF (acute renal failure) ([place]) 04/2015  . Asthma   . Cellulitis of right upper extremity   . Coronary artery disease   . Diabetes mellitus    insulin dependent  . Hyperlipidemia LDL goal <70   . Hypertension   . NSTEMI (non-ST elevated  myocardial infarction) ([place]) 08/2016  . Obesity   . S/P angioplasty with stent 08/2016   DES to mLAD and PTCA only to 2nd diag ostium.     Tobacco Use: History  Smoking Status  . Never Smoker  Smokeless Tobacco  . Never Used    Labs: Recent Review Flowsheet Data    Labs for ITP Cardiac and Pulmonary Rehab Latest Ref Rng & Units 06/10/2016 06/19/2016 09/07/2016 09/08/2016 10/28/2016   Cholestrol <200 mg/dL - 212(H) 201(H) - 124   LDLCALC mg/dL - 113 117(H) - 54   HDL >50 mg/dL - 52 40(L) - 35(L)   Trlycerides <150 mg/dL - 236(H) 219(H) - 174(H)   Hemoglobin A1c 4.8 - 5.6 % 13.1 - - 12.4(H) -   HCO3 20.0 - 24.0 mEq/L - - - - -   TCO2 0 - 100 mmol/L - - - - -   ACIDBASEDEF 0.0 - 2.0 mmol/L - - - - -   O2SAT % - - - - -      Capillary Blood Glucose: Lab Results  Component Value Date   GLUCAP 230 (H) 09/09/2016   GLUCAP 222 (H) 09/09/2016   GLUCAP 183 (H) 09/09/2016   GLUCAP 243 (H) 09/09/2016   GLUCAP 280 (H) 09/08/2016     Exercise Target Goals: Date: 12/10/16  Exercise Program Goal: Individual exercise prescription set with THRR, safety & activity barriers. Participant demonstrates ability to understand and report RPE using BORG scale, to self-measure pulse accurately, and to acknowledge the importance of the exercise prescription.  Exercise Prescription Goal: Starting with aerobic activity 30 plus minutes a day, 3 days per week for initial exercise prescription. Provide home exercise prescription and guidelines that participant acknowledges understanding prior to discharge.  Activity Barriers & Risk Stratification:     Activity Barriers & Cardiac Risk Stratification - 12/10/16 0909      Activity Barriers & Cardiac Risk Stratification   Activity Barriers Deconditioning;Muscular Weakness;Balance Concerns;Back Problems;Other (comment)   Comments R shoulder surgery, R backsided arm numbness and asthma   Cardiac Risk Stratification High      6 Minute Walk:     6  Minute Walk    Row Name 12/10/16 0934 12/10/16 1133       6 Minute Walk   Phase Initial  -    Distance 1300 feet  -    Walk Time 6 minutes  -    # of Rest Breaks 0  -    MPH  - 2.5    METS  - 3.6    RPE 11  -    VO2 Peak  - 12.6    Symptoms No  -    Resting HR 99 bpm  -    Resting BP 153/97  -    Max Ex. HR 135 bpm  -    Max Ex. BP 167/95  -    2 Minute Post BP  142/90  -       Initial Exercise Prescription:     Initial Exercise Prescription - 12/10/16 1100      Date of Initial Exercise RX and Referring Provider   Date 12/10/16   Referring Provider Daneen Schick MD     Recumbant Bike   Level 2   Minutes 10   METs 1.2     NuStep   Level 3   Minutes 10   METs 1.2     Track   Laps 8   Minutes 10   METs 2.39     Prescription Details   Frequency (times per week) 3   Duration Progress to 30 minutes of continuous aerobic without signs/symptoms of physical distress     Intensity   THRR 40-80% of Max Heartrate 74-128   Ratings of Perceived Exertion 11-13   Perceived Dyspnea 0-4     Progression   Progression Continue progressive overload as per policy without signs/symptoms or physical distress.     Resistance Training   Training Prescription Yes   Weight 3lbs   Reps 10-12      Perform Capillary Blood Glucose checks as needed.  Exercise Prescription Changes:   Exercise Comments:   Discharge Exercise Prescription (Final Exercise Prescription Changes):   Nutrition:  Target Goals: Understanding of nutrition guidelines, daily intake of sodium <1585m, cholesterol <2054m calories 30% from fat and 7% or less from saturated fats, daily to have 5 or more servings of fruits and vegetables.  Biometrics:     Pre Biometrics - 12/10/16 1134      Pre Biometrics   % Body Fat 66.7 %       Nutrition Therapy Plan and Nutrition Goals:   Nutrition Discharge: Nutrition Scores:   Nutrition Goals Re-Evaluation:   Psychosocial: Target Goals:  Acknowledge presence or absence of depression, maximize coping skills, provide positive support system. Participant is able to verbalize types and ability to use techniques and skills needed for reducing stress and depression.  Initial Review & Psychosocial Screening:     Initial Psych Review & Screening - 12/10/16 1418      Initial Review   Current issues with Current Stress Concerns   Comments rates medium stress level for health, family and finances      Family Dynamics   Concerns Inappropriate over/under dependence on family/friends   Comments Cares for her sisters children at times     Barriers   Psychosocial barriers to participate in program The patient should benefit from training in stress management and relaxation.     Screening Interventions   Interventions Encouraged to exercise      Quality of Life Scores:   PHQ-9: Recent Review Flowsheet Data    [facility] 11/03/2016 09/28/2016 07/28/2016 06/10/2016   Decreased Interest 0 0 0 0   Down, Depressed, Hopeless 0 0 0 0   PHQ - 2 Score 0 0 0 0      Psychosocial Evaluation and Intervention:     Psychosocial Evaluation - 12/10/16 1425      Psychosocial Evaluation & Interventions   Interventions Stress management education   Comments Brief psychosocial assessment reveals concerns/issues with stress managament.   Continued Psychosocial Services Needed Yes      Psychosocial Re-Evaluation:   Vocational Rehabilitation: Provide vocational rehab assistance to qualifying candidates.   Vocational Rehab Evaluation & Intervention:   Education: Education Goals: Education classes will be provided on a weekly basis, covering required topics. Participant will state understanding/return  demonstration of topics presented.  Learning Barriers/Preferences:     Learning Barriers/Preferences - 12/10/16 0909      Learning Barriers/Preferences   Learning Barriers Sight   Learning Preferences Skilled  Demonstration;Verbal Instruction;Video;Written Material;Pictoral      Education Topics: Count Your Pulse:  -Group instruction provided by verbal instruction, demonstration, patient participation and written materials to support subject.  Instructors address importance of being able to find your pulse and how to count your pulse when at home without a heart monitor.  Patients get hands on experience counting their pulse with staff help and individually.   Heart Attack, Angina, and Risk Factor Modification:  -Group instruction provided by verbal instruction, video, and written materials to support subject.  Instructors address signs and symptoms of angina and heart attacks.    Also discuss risk factors for heart disease and how to make changes to improve heart health risk factors.   Functional Fitness:  -Group instruction provided by verbal instruction, demonstration, patient participation, and written materials to support subject.  Instructors address safety measures for doing things around the house.  Discuss how to get up and down off the floor, how to pick things up properly, how to safely get out of a chair without assistance, and balance training.   Meditation and Mindfulness:  -Group instruction provided by verbal instruction, patient participation, and written materials to support subject.  Instructor addresses importance of mindfulness and meditation practice to help reduce stress and improve awareness.  Instructor also leads participants through a meditation exercise.    Stretching for Flexibility and Mobility:  -Group instruction provided by verbal instruction, patient participation, and written materials to support subject.  Instructors lead participants through series of stretches that are designed to increase flexibility thus improving mobility.  These stretches are additional exercise for major muscle groups that are typically performed during regular warm up and cool  down.   Hands Only CPR Anytime:  -Group instruction provided by verbal instruction, video, patient participation and written materials to support subject.  Instructors co-teach with AHA video for hands only CPR.  Participants get hands on experience with mannequins.   Nutrition I class: Heart Healthy Eating:  -Group instruction provided by PowerPoint slides, verbal discussion, and written materials to support subject matter. The instructor gives an explanation and review of the Therapeutic Lifestyle Changes diet recommendations, which includes a discussion on lipid goals, dietary fat, sodium, fiber, plant stanol/sterol esters, sugar, and the components of a well-balanced, healthy diet.   Nutrition II class: Lifestyle Skills:  -Group instruction provided by PowerPoint slides, verbal discussion, and written materials to support subject matter. The instructor gives an explanation and review of label reading, grocery shopping for heart health, heart healthy recipe modifications, and ways to make healthier choices when eating out.   Diabetes Question & Answer:  -Group instruction provided by PowerPoint slides, verbal discussion, and written materials to support subject matter. The instructor gives an explanation and review of diabetes co-morbidities, pre- and post-prandial blood glucose goals, pre-exercise blood glucose goals, signs, symptoms, and treatment of hypoglycemia and hyperglycemia, and foot care basics.   Diabetes Blitz:  -Group instruction provided by PowerPoint slides, verbal discussion, and written materials to support subject matter. The instructor gives an explanation and review of the physiology behind type 1 and type 2 diabetes, diabetes medications and rational behind using different medications, pre- and post-prandial blood glucose recommendations and Hemoglobin A1c goals, diabetes diet, and exercise including blood glucose guidelines for exercising safely.    Portion  Distortion:   -Group instruction provided by PowerPoint slides, verbal discussion, written materials, and food models to support subject matter. The instructor gives an explanation of serving size versus portion size, changes in portions sizes over the last 20 years, and what consists of a serving from each food group.   Stress Management:  -Group instruction provided by verbal instruction, video, and written materials to support subject matter.  Instructors review role of stress in heart disease and how to cope with stress positively.     Exercising on Your Own:  -Group instruction provided by verbal instruction, power point, and written materials to support subject.  Instructors discuss benefits of exercise, components of exercise, frequency and intensity of exercise, and end points for exercise.  Also discuss use of nitroglycerin and activating EMS.  Review options of places to exercise outside of rehab.  Review guidelines for sex with heart disease.   Cardiac Drugs I:  -Group instruction provided by verbal instruction and written materials to support subject.  Instructor reviews cardiac drug classes: antiplatelets, anticoagulants, beta blockers, and statins.  Instructor discusses reasons, side effects, and lifestyle considerations for each drug class.   Cardiac Drugs II:  -Group instruction provided by verbal instruction and written materials to support subject.  Instructor reviews cardiac drug classes: angiotensin converting enzyme inhibitors (ACE-I), angiotensin II receptor blockers (ARBs), nitrates, and calcium channel blockers.  Instructor discusses reasons, side effects, and lifestyle considerations for each drug class.   Anatomy and Physiology of the Circulatory System:  -Group instruction provided by verbal instruction, video, and written materials to support subject.  Reviews functional anatomy of heart, how it relates to various diagnoses, and what role the heart plays in the overall  system.   Knowledge Questionnaire Score:   Core Components/Risk Factors/Patient Goals at Admission:     Personal Goals and Risk Factors at Admission - 12/10/16 0910      Core Components/Risk Factors/Patient Goals on Admission    Weight Management Yes;Obesity;Weight Loss   Intervention Weight Management: Develop a combined nutrition and exercise program designed to reach desired caloric intake, while maintaining appropriate intake of nutrient and fiber, sodium and fats, and appropriate energy expenditure required for the weight goal.;Weight Management: Provide education and appropriate resources to help participant work on and attain dietary goals.;Weight Management/Obesity: Establish reasonable short term and long term weight goals.;Obesity: Provide education and appropriate resources to help participant work on and attain dietary goals.   Expected Outcomes Short Term: Continue to assess and modify interventions until short term weight is achieved;Long Term: Adherence to nutrition and physical activity/exercise program aimed toward attainment of established weight goal;Weight Maintenance: Understanding of the daily nutrition guidelines, which includes 25-35% calories from fat, 7% or less cal from saturated fats, less than 276m cholesterol, less than 1.5gm of sodium, & 5 or more servings of fruits and vegetables daily;Weight Loss: Understanding of general recommendations for a balanced deficit meal plan, which promotes 1-2 lb weight loss per week and includes a negative energy balance of [phone] kcal/d;Understanding recommendations for meals to include 15-35% energy as protein, 25-35% energy from fat, 35-60% energy from carbohydrates, less than 2016mof dietary cholesterol, 20-35 gm of total fiber daily;Understanding of distribution of calorie intake throughout the day with the consumption of 4-5 meals/snacks   Sedentary Yes   Intervention Provide advice, education, support and counseling about  physical activity/exercise needs.;Develop an individualized exercise prescription for aerobic and resistive training based on initial evaluation findings, risk stratification, comorbidities and participant's personal goals.  Expected Outcomes Achievement of increased cardiorespiratory fitness and enhanced flexibility, muscular endurance and strength shown through measurements of functional capacity and personal statement of participant.   Increase Strength and Stamina Yes   Intervention Provide advice, education, support and counseling about physical activity/exercise needs.;Develop an individualized exercise prescription for aerobic and resistive training based on initial evaluation findings, risk stratification, comorbidities and participant's personal goals.   Improve shortness of breath with ADL's Yes   Intervention Provide education, individualized exercise plan and daily activity instruction to help decrease symptoms of SOB with activities of daily living.   Expected Outcomes Short Term: Achieves a reduction of symptoms when performing activities of daily living.   Diabetes Yes   Intervention Provide education about signs/symptoms and action to take for hypo/hyperglycemia.;Provide education about proper nutrition, including hydration, and aerobic/resistive exercise prescription along with prescribed medications to achieve blood glucose in normal ranges: Fasting glucose 65-99 mg/dL   Expected Outcomes Short Term: Participant verbalizes understanding of the signs/symptoms and immediate care of hyper/hypoglycemia, proper foot care and importance of medication, aerobic/resistive exercise and nutrition plan for blood glucose control.;Long Term: Attainment of HbA1C < 7%.   Hypertension Yes   Intervention Provide education on lifestyle modifcations including regular physical activity/exercise, weight management, moderate sodium restriction and increased consumption of fresh fruit, vegetables, and low fat  dairy, alcohol moderation, and smoking cessation.;Monitor prescription use compliance.   Expected Outcomes Short Term: Continued assessment and intervention until BP is < 140/47m HG in hypertensive participants. < 130/827mHG in hypertensive participants with diabetes, heart failure or chronic kidney disease.;Long Term: Maintenance of blood pressure at goal levels.   Lipids Yes   Intervention Provide education and support for participant on nutrition & aerobic/resistive exercise along with prescribed medications to achieve LDL <7011mHDL >54m59m Expected Outcomes Long Term: Cholesterol controlled with medications as prescribed, with individualized exercise RX and with personalized nutrition plan. Value goals: LDL < 70mg38mL > 40 mg.;Short Term: Participant states understanding of desired cholesterol values and is compliant with medications prescribed. Participant is following exercise prescription and nutrition guidelines.   Stress Yes   Intervention Offer individual and/or small group education and counseling on adjustment to heart disease, stress management and health-related lifestyle change. Teach and support self-help strategies.;Refer participants experiencing significant psychosocial distress to appropriate mental health specialists for further evaluation and treatment. When possible, include family members and significant others in education/counseling sessions.   Expected Outcomes Short Term: Participant demonstrates changes in health-related behavior, relaxation and other stress management skills, ability to obtain effective social support, and compliance with psychotropic medications if prescribed.;Long Term: Emotional wellbeing is indicated by absence of clinically significant psychosocial distress or social isolation.   Personal Goal Other Yes   Personal Goal Better limitations and understanding of activity levels/exercise. Better control and management of diabetes   Intervention Provide  nutrition counseling on diabetes and heart healthy diet. Provide exercise programming to assist with improving overall fitness level.   Expected Outcomes Pt will have better understanding and management of diabetes. Pt will also have an established exercise routine and feel comfortable with activity/exertion.      Core Components/Risk Factors/Patient Goals Review:    Core Components/Risk Factors/Patient Goals at Discharge (Final Review):    ITP Comments:     ITP Comments    Row Name 12/10/16 0907           ITP Comments Dr. TraciFransico Himical Director          Comments:  Pt is in today for phase II cardiac rehab orientation from 0800-1015. Pt to bring in patient completed surveys on next week.  Will be able to add in patient provided assessments at that time.  As a part of the cardiac rehab orientation pt completed warm up stretches and 6 minute walk test. Pt with delay in the start of the walk test due to elevation of bp.  Pt did not take her medications or eat prior to coming to the orientation appt. Declined offered snack to eat.  Able to proceed with walk test after pt sat for 10 minutes resting bp returned to an acceptable reading.  Pt tolerated walk test with no complaints of sob or cp.  Monitor showed SR/ST with no noted ectopy.  Brief psychosocial assessment reveals stress related concerns regarding health, family and finances.  Pt school aged niece and nephew accompany pt for today's appt.  Pt cares for he sisters children from time to time. Pt will benefit from stress management.  Continue to periodically check in with pt to assess overall well being. Pt is looking forward to full exercise next week. Cherre Huger, BSN

## 2016-12-16 ENCOUNTER — Encounter (HOSPITAL_COMMUNITY): Payer: Self-pay

## 2016-12-16 ENCOUNTER — Encounter (HOSPITAL_COMMUNITY)
Admission: RE | Admit: 2016-12-16 | Discharge: 2016-12-16 | Disposition: A | Discharge status: Home / Self Care | Payer: Medicaid Other | Source: Ambulatory Visit | Attending: Interventional Cardiology | Admitting: Interventional Cardiology

## 2016-12-16 DIAGNOSIS — Z955 Presence of coronary angioplasty implant and graft: Secondary | ICD-10-CM | POA: Diagnosis not present

## 2016-12-16 DIAGNOSIS — I214 Non-ST elevation (NSTEMI) myocardial infarction: Secondary | ICD-10-CM

## 2016-12-16 LAB — GLUCOSE, CAPILLARY
GLUCOSE-CAPILLARY: 317 mg/dL — AB (ref 65–99)
GLUCOSE-CAPILLARY: 236 mg/dL — AB (ref 65–99)

## 2016-12-16 NOTE — Progress Notes (Signed)
Nutrition Note Spoke with pt. Pt pre-exercise CBG 317 mg/dL. Pt reports she and her MD are working toward tighter CBG control. Barriers to better pre-exercise CBG's include pt's 1.5 hr bus ride to Cardiac Rehab and pt does not like to eat breakfast. Pt does not want to come to a later class time. Pt states she did see Dr. Lucianne Muss for DM medication, "but he moved somewhere and [facility] have been managing my blood sugar recently." Pt given Dr. Remus Blake address and phone number. If pt CBG > 300 mg/dL Friday 75/17/00, pt may need to consider focusing on tighter CBG control before starting rehab. Pt expressed understanding of the information reviewed via feedback method. Continue client-centered nutrition education by RD as part of interdisciplinary care.  Monitor and evaluate progress toward nutrition goal with team.  Mickle Plumb, M.Ed, RD, LDN, CDE 12/16/2016 10:09 AM

## 2016-12-16 NOTE — Progress Notes (Addendum)
Pt pre exercise CBG-317.  Pt did not exercise at cardiac rehab per protocol.  Pt states she has not eaten this morning therefore has not taken her insulin.  Pt asymptomatic.  Pt is currently using [facility] for diabetes management.  PC to Dr. Ronnie Derby office to schedule appointment on pt behalf.  Pt met with Derek Mound, CDE to discuss diabetes self care and goals.  Pt given breakfast and instructed to take her insulin as directed. Pt also advised to bring home glucometer with her to next scheduled cardiac rehab session.  Upon med list reconciliation pt states she is unsure if she is currently taking amlodipine.  Message forwarded to Dr. [name] to clarify order.  Pt instructed to bring med bottles with her to next scheduled cardiac rehab appointment. Pt verbalized understanding.

## 2016-12-18 ENCOUNTER — Encounter (HOSPITAL_COMMUNITY)
Admission: RE | Admit: 2016-12-18 | Discharge: 2016-12-18 | Disposition: A | Discharge status: Home / Self Care | Payer: Medicaid Other | Source: Ambulatory Visit | Attending: Interventional Cardiology | Admitting: Interventional Cardiology

## 2016-12-18 ENCOUNTER — Encounter (HOSPITAL_COMMUNITY): Payer: Self-pay

## 2016-12-18 DIAGNOSIS — Z955 Presence of coronary angioplasty implant and graft: Secondary | ICD-10-CM | POA: Diagnosis not present

## 2016-12-18 DIAGNOSIS — I214 Non-ST elevation (NSTEMI) myocardial infarction: Secondary | ICD-10-CM

## 2016-12-18 LAB — GLUCOSE, CAPILLARY
GLUCOSE-CAPILLARY: 252 mg/dL — AB (ref 65–99)
Glucose-Capillary: 299 mg/dL — ABNORMAL HIGH (ref 65–99)

## 2016-12-18 NOTE — Progress Notes (Signed)
Daily Session Note  Patient Details  Name: Lindsay [name] MRN: [id number] Date of Birth: [birth year] Referring Provider:   Flowsheet Row CARDIAC REHAB PHASE II ORIENTATION from 12/10/2016 in [place]  Referring Provider  Daneen Schick MD      Encounter Date: 12/18/2016  Check In:     Session Check In - 12/18/16 0826      Check-In   Location [facility]   Staff Present Cleda Mccreedy, MS, Exercise Physiologist;Amber Fair, MS, ACSM RCEP, Exercise Physiologist;Joann Rion, RN, Luisa Hart, RN, Marga Melnick, RN, BSN   Supervising physician immediately available to respond to emergencies [facility] immediately available   Physician(s) Dr. Wynetta Emery    Medication changes reported     No   Fall or balance concerns reported    No   Warm-up and Cool-down Performed as group-led instruction   Resistance Training Performed Yes   VAD Patient? No     Pain Assessment   Currently in Pain? No/denies      Capillary Blood Glucose: Results for orders placed or performed during the hospital encounter of 12/18/16 (from the past 24 hour(s))  Glucose, capillary     Status: Abnormal   Collection Time: 12/18/16  8:39 AM  Result Value Ref Range   Glucose-Capillary 299 (H) 65 - 99 mg/dL  Glucose, capillary     Status: Abnormal   Collection Time: 12/18/16  9:33 AM  Result Value Ref Range   Glucose-Capillary 252 (H) 65 - 99 mg/dL     Goals Met:  Exercise tolerated well  Goals Unmet:  Not Applicable  Comments: Pt started cardiac rehab today.  Pt tolerated light exercise without difficulty. VSS, telemetry-sinus rhythm,  asymptomatic.  Medication list reconciled. Pt denies barriers to medicaiton compliance.  PSYCHOSOCIAL ASSESSMENT:  PHQ-0. Pt exhibits positive coping skills, hopeful outlook with supportive family. No psychosocial needs identified at this time, no psychosocial interventions necessary.    Pt enjoys bowling and  spending time with her 35yo and 6yo children.  Pt also cares for her sister's children at times.  Pt goals for cardiac rehab are to lose weight and develop exercise habits.   Pt oriented to exercise equipment and routine.    Understanding verbalized.   Dr. Fransico Him is Medical Director for Cardiac Rehab at [facility]

## 2016-12-23 ENCOUNTER — Encounter (HOSPITAL_COMMUNITY)
Admission: RE | Admit: 2016-12-23 | Discharge: 2016-12-23 | Disposition: A | Discharge status: Home / Self Care | Payer: Medicaid Other | Source: Ambulatory Visit | Attending: Interventional Cardiology | Admitting: Interventional Cardiology

## 2016-12-23 ENCOUNTER — Ambulatory Visit: Payer: Medicaid Other | Admitting: Endocrinology

## 2016-12-23 DIAGNOSIS — I219 Acute myocardial infarction, unspecified: Secondary | ICD-10-CM | POA: Insufficient documentation

## 2016-12-23 DIAGNOSIS — Z955 Presence of coronary angioplasty implant and graft: Secondary | ICD-10-CM | POA: Insufficient documentation

## 2016-12-23 NOTE — Progress Notes (Signed)
Cardiac Individual Treatment Plan  Patient Details  Name: ERMINA OBERMAN MRN: [id number] Date of Birth: 1981-10-11 Referring Provider:   Flowsheet Row CARDIAC REHAB PHASE II ORIENTATION from 12/10/2016 in [facility]  Referring Provider  Daneen Schick MD      Initial Encounter Date:  [place] PHASE II ORIENTATION from 12/10/2016 in [facility]  Date  12/10/16  Referring Provider  Daneen Schick MD      Visit Diagnosis: No diagnosis found.  Patient's Home Medications on Admission:  Current Outpatient Prescriptions:  .  ACCU-CHEK SOFTCLIX LANCETS lancets, Use as instructed for 4 times daily blood glucose monitoring, Disp: 100 each, Rfl: 12 .  albuterol (PROVENTIL HFA;VENTOLIN HFA) 108 (90 Base) MCG/ACT inhaler, Inhale 2 puffs into the lungs every 6 (six) hours as needed for wheezing or shortness of breath., Disp: 1 Inhaler, Rfl: 2 .  amLODipine (NORVASC) 10 MG tablet, Take 1 tablet (10 mg total) by mouth daily. (Patient not taking: Reported on 12/16/2016), Disp: 90 tablet, Rfl: 3 .  aspirin 81 MG tablet, Take 81 mg by mouth daily., Disp: , Rfl:  .  atorvastatin (LIPITOR) 80 MG tablet, Take 1 tablet (80 mg total) by mouth daily at 6 PM., Disp: 30 tablet, Rfl: 6 .  Blood Glucose Monitoring Suppl (ACCU-CHEK AVIVA PLUS) w/Device KIT, Use as directed for 4 times daily blood glucose monitoring, Disp: 1 kit, Rfl: 0 .  gabapentin (NEURONTIN) 300 MG capsule, Take 2 capsules (600 mg total) by mouth 4 (four) times daily., Disp: 240 capsule, Rfl: 3 .  glucose blood (ACCU-CHEK AVIVA PLUS) test strip, Use as instructed for 4 times daily blood glucose monitoring, Disp: 100 each, Rfl: 12 .  insulin glargine (LANTUS) 100 unit/mL SOPN, Inject 0.42 mLs (42 Units total) into the skin 2 (two) times daily. (Patient taking differently: Inject 55 Units into the skin 2 (two) times daily. ), Disp: 15 pen, Rfl: 3 .  Insulin Pen Needle  31G X 5 MM MISC, 1 application by Does not apply route Nightly., Disp: 1000 each, Rfl: 3 .  Lancet Devices (ACCU-CHEK SOFTCLIX) lancets, Use as instructed for 4 times daily blood glucose monitoring, Disp: 1 each, Rfl: 0 .  losartan (COZAAR) 25 MG tablet, Take 1 tablet (25 mg total) by mouth daily., Disp: 30 tablet, Rfl: 6 .  medroxyPROGESTERone (DEPO-PROVERA) 150 MG/ML injection, Inject 150 mg into the muscle every 3 (three) months., Disp: , Rfl:  .  meloxicam (MOBIC) 15 MG tablet, TAKE ONE TABLET BY MOUTH ONCE DAILY, Disp: 30 tablet, Rfl: 0 .  metFORMIN (GLUCOPHAGE) 1000 MG tablet, Take 1 tablet (1,000 mg total) by mouth 2 (two) times daily with a meal., Disp: 180 tablet, Rfl: 3 .  methocarbamol (ROBAXIN) 500 MG tablet, Take 1 tablet (500 mg total) by mouth 4 (four) times daily., Disp: 120 tablet, Rfl: 2 .  metoprolol tartrate (LOPRESSOR) 50 MG tablet, Take 1 tablet (50 mg total) by mouth 2 (two) times daily., Disp: 120 tablet, Rfl: 3 .  nitroGLYCERIN (NITROSTAT) 0.4 MG SL tablet, Place 1 tablet (0.4 mg total) under the tongue every 5 (five) minutes as needed for chest pain., Disp: 25 tablet, Rfl: 12 .  NOVOLOG FLEXPEN 100 UNIT/ML FlexPen, INJECT 10 UNITS SUBCUTANEOUSLY THREE TIMES DAILY WITH MEALS (Patient taking differently: INJECT 10 UNITS SUBCUTANEOUSLY THREE TIMES DAILY WITH MEALS (Takes 10 units plus extra on sliding scale for carb coverage)), Disp: 15 mL, Rfl: 3 .  Prenatal Vit-Fe  Fumarate-FA (PRENATAL MULTIVITAMIN/IRON PO), Take 1 tablet by mouth daily., Disp: , Rfl:  .  ticagrelor (BRILINTA) 90 MG TABS tablet, Take 1 tablet (90 mg total) by mouth 2 (two) times daily., Disp: 60 tablet, Rfl: 11  Past Medical History: Past Medical History:  Diagnosis Date  . ARF (acute renal failure) ([place]) 04/2015  . Asthma   . Cellulitis of right upper extremity   . Coronary artery disease   . Diabetes mellitus    insulin dependent  . Hyperlipidemia LDL goal <70   . Hypertension   . NSTEMI (non-ST  elevated myocardial infarction) ([place]) 08/2016  . Obesity   . S/P angioplasty with stent 08/2016   DES to mLAD and PTCA only to 2nd diag ostium.     Tobacco Use: History  Smoking Status  . Never Smoker  Smokeless Tobacco  . Never Used    Labs: Recent Review Flowsheet Data    Labs for ITP Cardiac and Pulmonary Rehab Latest Ref Rng & Units 06/10/2016 06/19/2016 09/07/2016 09/08/2016 10/28/2016   Cholestrol <200 mg/dL - 212(H) 201(H) - 124   LDLCALC mg/dL - 113 117(H) - 54   HDL >50 mg/dL - 52 40(L) - 35(L)   Trlycerides <150 mg/dL - 236(H) 219(H) - 174(H)   Hemoglobin A1c 4.8 - 5.6 % 13.1 - - 12.4(H) -   HCO3 20.0 - 24.0 mEq/L - - - - -   TCO2 0 - 100 mmol/L - - - - -   ACIDBASEDEF 0.0 - 2.0 mmol/L - - - - -   O2SAT % - - - - -      Capillary Blood Glucose: Lab Results  Component Value Date   GLUCAP 252 (H) 12/18/2016   GLUCAP 299 (H) 12/18/2016   GLUCAP 236 (H) 12/16/2016   GLUCAP 317 (H) 12/16/2016   GLUCAP 230 (H) 09/09/2016     Exercise Target Goals:    Exercise Program Goal: Individual exercise prescription set with THRR, safety & activity barriers. Participant demonstrates ability to understand and report RPE using BORG scale, to self-measure pulse accurately, and to acknowledge the importance of the exercise prescription.  Exercise Prescription Goal: Starting with aerobic activity 30 plus minutes a day, 3 days per week for initial exercise prescription. Provide home exercise prescription and guidelines that participant acknowledges understanding prior to discharge.  Activity Barriers & Risk Stratification:     Activity Barriers & Cardiac Risk Stratification - 12/10/16 0909      Activity Barriers & Cardiac Risk Stratification   Activity Barriers Deconditioning;Muscular Weakness;Balance Concerns;Back Problems;Other (comment)   Comments R shoulder surgery, R backsided arm numbness and asthma   Cardiac Risk Stratification High      6 Minute Walk:     6  Minute Walk    Row Name 12/10/16 0934 12/10/16 1133       6 Minute Walk   Phase Initial  -    Distance 1300 feet  -    Walk Time 6 minutes  -    # of Rest Breaks 0  -    MPH  - 2.5    METS  - 3.6    RPE 11  -    VO2 Peak  - 12.6    Symptoms No  -    Resting HR 99 bpm  -    Resting BP 153/97  -    Max Ex. HR 135 bpm  -    Max Ex. BP 167/95  -    2 Minute  Post BP 142/90  -       Initial Exercise Prescription:     Initial Exercise Prescription - 12/10/16 1100      Date of Initial Exercise RX and Referring Provider   Date 12/10/16   Referring Provider Daneen Schick MD     Recumbant Bike   Level 2   Minutes 10   METs 1.2     NuStep   Level 3   Minutes 10   METs 1.2     Track   Laps 8   Minutes 10   METs 2.39     Prescription Details   Frequency (times per week) 3   Duration Progress to 30 minutes of continuous aerobic without signs/symptoms of physical distress     Intensity   THRR 40-80% of Max Heartrate 74-128   Ratings of Perceived Exertion 11-13   Perceived Dyspnea 0-4     Progression   Progression Continue progressive overload as per policy without signs/symptoms or physical distress.     Resistance Training   Training Prescription Yes   Weight 3lbs   Reps 10-12      Perform Capillary Blood Glucose checks as needed.  Exercise Prescription Changes:     Exercise Prescription Changes    Row Name 12/22/16 1000             Exercise Review   Progression Yes         Response to Exercise   Blood Pressure (Admit) 142/92       Blood Pressure (Exercise) 142/92       Blood Pressure (Exit) 120/86       Heart Rate (Admit) 86 bpm       Heart Rate (Exercise) 109 bpm       Heart Rate (Exit) 85 bpm       Rating of Perceived Exertion (Exercise) 13       Symptoms none       Duration Progress to 30 minutes of continuous aerobic without signs/symptoms of physical distress       Intensity THRR unchanged         Progression   Progression Continue  progressive overload as per policy without signs/symptoms or physical distress.       Average METs 2         Resistance Training   Training Prescription Yes       Weight 3lbs       Reps 10-12         Recumbant Bike   Level 2       Minutes 10       METs 1.2         NuStep   Level 3       Minutes 10       METs 1.8         Track   Laps 7       Minutes 10       METs 2.23          Exercise Comments:     Exercise Comments    Row Name 12/22/16 1013           Exercise Comments There are no changes to current Ex.Rx will continue to monitor exercise progression, and signs/symptoms of physical distress.          Discharge Exercise Prescription (Final Exercise Prescription Changes):     Exercise Prescription Changes - 12/22/16 1000      Exercise Review   Progression Yes  Response to Exercise   Blood Pressure (Admit) 142/92   Blood Pressure (Exercise) 142/92   Blood Pressure (Exit) 120/86   Heart Rate (Admit) 86 bpm   Heart Rate (Exercise) 109 bpm   Heart Rate (Exit) 85 bpm   Rating of Perceived Exertion (Exercise) 13   Symptoms none   Duration Progress to 30 minutes of continuous aerobic without signs/symptoms of physical distress   Intensity THRR unchanged     Progression   Progression Continue progressive overload as per policy without signs/symptoms or physical distress.   Average METs 2     Resistance Training   Training Prescription Yes   Weight 3lbs   Reps 10-12     Recumbant Bike   Level 2   Minutes 10   METs 1.2     NuStep   Level 3   Minutes 10   METs 1.8     Track   Laps 7   Minutes 10   METs 2.23      Nutrition:  Target Goals: Understanding of nutrition guidelines, daily intake of sodium <1585m, cholesterol <2064m calories 30% from fat and 7% or less from saturated fats, daily to have 5 or more servings of fruits and vegetables.  Biometrics:     Pre Biometrics - 12/10/16 1134      Pre Biometrics   % Body Fat 66.7 %        Nutrition Therapy Plan and Nutrition Goals:   Nutrition Discharge: Nutrition Scores:   Nutrition Goals Re-Evaluation:   Psychosocial: Target Goals: Acknowledge presence or absence of depression, maximize coping skills, provide positive support system. Participant is able to verbalize types and ability to use techniques and skills needed for reducing stress and depression.  Initial Review & Psychosocial Screening:     Initial Psych Review & Screening - 12/10/16 1418      Initial Review   Current issues with Current Stress Concerns   Comments rates medium stress level for health, family and finances      Family Dynamics   Concerns Inappropriate over/under dependence on family/friends   Comments Cares for her sisters children at times     Barriers   Psychosocial barriers to participate in program The patient should benefit from training in stress management and relaxation.     Screening Interventions   Interventions Encouraged to exercise      Quality of Life Scores:     Quality of Life - 12/16/16 1127      Quality of Life Scores   Health/Function Pre 18.64 %   Socioeconomic Pre 17.08 %   Psych/Spiritual Pre 22.93 %   Family Pre 22.38 %   GLOBAL Pre 19.79 %      PHQ-9: Recent Review Flowsheet Data    [facility] 12/16/2016 11/03/2016 09/28/2016 07/28/2016 06/10/2016   Decreased Interest 0 0 0 0 0   Down, Depressed, Hopeless 0 0 0 0 0   PHQ - 2 Score 0 0 0 0 0      Psychosocial Evaluation and Intervention:     Psychosocial Evaluation - 12/10/16 1425      Psychosocial Evaluation & Interventions   Interventions Stress management education   Comments Brief psychosocial assessment reveals concerns/issues with stress managament.   Continued Psychosocial Services Needed Yes      Psychosocial Re-Evaluation:     Psychosocial Re-Evaluation    [place] 12/23/16 0707             Psychosocial Re-Evaluation  Interventions Encouraged to  attend Cardiac Rehabilitation for the exercise;Stress management education;Relaxation education       Comments pt psychosocial barriers to rehab include blood sugar control, improved adherence to dietary recommendations and medication compliance.  financial barriers to exist.  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        Continued Psychosocial Services Needed Yes          Vocational Rehabilitation: Provide vocational rehab assistance to qualifying candidates.   Vocational Rehab Evaluation & Intervention:     Vocational Rehab - 12/18/16 1725      Initial Vocational Rehab Evaluation & Intervention   Assessment shows need for Vocational Rehabilitation Yes   Vocational Rehab Packet given to patient 12/18/16      Education: Education Goals: Education classes will be provided on a weekly basis, covering required topics. Participant will state understanding/return demonstration of topics presented.  Learning Barriers/Preferences:     Learning Barriers/Preferences - 12/10/16 0909      Learning Barriers/Preferences   Learning Barriers Sight   Learning Preferences Skilled Demonstration;Verbal Instruction;Video;Written Material;Pictoral      Education Topics: Count Your Pulse:  -Group instruction provided by verbal instruction, demonstration, patient participation and written materials to support subject.  Instructors address importance of being able to find your pulse and how to count your pulse when at home without a heart monitor.  Patients get hands on experience counting their pulse with staff help and individually.   Heart Attack, Angina, and Risk Factor Modification:  -Group instruction provided by verbal instruction, video, and written materials to support subject.  Instructors address signs and symptoms of angina and heart attacks.    Also discuss risk factors for heart disease and how to make changes to improve heart health risk  factors.   Functional Fitness:  -Group instruction provided by verbal instruction, demonstration, patient participation, and written materials to support subject.  Instructors address safety measures for doing things around the house.  Discuss how to get up and down off the floor, how to pick things up properly, how to safely get out of a chair without assistance, and balance training.   Meditation and Mindfulness:  -Group instruction provided by verbal instruction, patient participation, and written materials to support subject.  Instructor addresses importance of mindfulness and meditation practice to help reduce stress and improve awareness.  Instructor also leads participants through a meditation exercise.    Stretching for Flexibility and Mobility:  -Group instruction provided by verbal instruction, patient participation, and written materials to support subject.  Instructors lead participants through series of stretches that are designed to increase flexibility thus improving mobility.  These stretches are additional exercise for major muscle groups that are typically performed during regular warm up and cool down.   Hands Only CPR Anytime:  -Group instruction provided by verbal instruction, video, patient participation and written materials to support subject.  Instructors co-teach with AHA video for hands only CPR.  Participants get hands on experience with mannequins.   Nutrition I class: Heart Healthy Eating:  -Group instruction provided by PowerPoint slides, verbal discussion, and written materials to support subject matter. The instructor gives an explanation and review of the Therapeutic Lifestyle Changes diet recommendations, which includes a discussion on lipid goals, dietary fat, sodium, fiber, plant stanol/sterol esters, sugar, and the components of a well-balanced, healthy diet.   Nutrition II class: Lifestyle Skills:  -Group instruction provided by PowerPoint slides, verbal  discussion, and written materials to  support subject matter. The instructor gives an explanation and review of label reading, grocery shopping for heart health, heart healthy recipe modifications, and ways to make healthier choices when eating out.   Diabetes Question & Answer:  -Group instruction provided by PowerPoint slides, verbal discussion, and written materials to support subject matter. The instructor gives an explanation and review of diabetes co-morbidities, pre- and post-prandial blood glucose goals, pre-exercise blood glucose goals, signs, symptoms, and treatment of hypoglycemia and hyperglycemia, and foot care basics.   Diabetes Blitz:  -Group instruction provided by PowerPoint slides, verbal discussion, and written materials to support subject matter. The instructor gives an explanation and review of the physiology behind type 1 and type 2 diabetes, diabetes medications and rational behind using different medications, pre- and post-prandial blood glucose recommendations and Hemoglobin A1c goals, diabetes diet, and exercise including blood glucose guidelines for exercising safely.    Portion Distortion:  -Group instruction provided by PowerPoint slides, verbal discussion, written materials, and food models to support subject matter. The instructor gives an explanation of serving size versus portion size, changes in portions sizes over the last 20 years, and what consists of a serving from each food group.   Stress Management:  -Group instruction provided by verbal instruction, video, and written materials to support subject matter.  Instructors review role of stress in heart disease and how to cope with stress positively.     Exercising on Your Own:  -Group instruction provided by verbal instruction, power point, and written materials to support subject.  Instructors discuss benefits of exercise, components of exercise, frequency and intensity of exercise, and end points for  exercise.  Also discuss use of nitroglycerin and activating EMS.  Review options of places to exercise outside of rehab.  Review guidelines for sex with heart disease.   Cardiac Drugs I:  -Group instruction provided by verbal instruction and written materials to support subject.  Instructor reviews cardiac drug classes: antiplatelets, anticoagulants, beta blockers, and statins.  Instructor discusses reasons, side effects, and lifestyle considerations for each drug class.   Cardiac Drugs II:  -Group instruction provided by verbal instruction and written materials to support subject.  Instructor reviews cardiac drug classes: angiotensin converting enzyme inhibitors (ACE-I), angiotensin II receptor blockers (ARBs), nitrates, and calcium channel blockers.  Instructor discusses reasons, side effects, and lifestyle considerations for each drug class.   Anatomy and Physiology of the Circulatory System:  -Group instruction provided by verbal instruction, video, and written materials to support subject.  Reviews functional anatomy of heart, how it relates to various diagnoses, and what role the heart plays in the overall system.   Knowledge Questionnaire Score:     Knowledge Questionnaire Score - 12/16/16 1127      Knowledge Questionnaire Score   Pre Score 19/24      Core Components/Risk Factors/Patient Goals at Admission:     Personal Goals and Risk Factors at Admission - 12/10/16 0910      Core Components/Risk Factors/Patient Goals on Admission    Weight Management Yes;Obesity;Weight Loss   Intervention Weight Management: Develop a combined nutrition and exercise program designed to reach desired caloric intake, while maintaining appropriate intake of nutrient and fiber, sodium and fats, and appropriate energy expenditure required for the weight goal.;Weight Management: Provide education and appropriate resources to help participant work on and attain dietary goals.;Weight  Management/Obesity: Establish reasonable short term and long term weight goals.;Obesity: Provide education and appropriate resources to help participant work on and attain dietary goals.  Expected Outcomes Short Term: Continue to assess and modify interventions until short term weight is achieved;Long Term: Adherence to nutrition and physical activity/exercise program aimed toward attainment of established weight goal;Weight Maintenance: Understanding of the daily nutrition guidelines, which includes 25-35% calories from fat, 7% or less cal from saturated fats, less than 213m cholesterol, less than 1.5gm of sodium, & 5 or more servings of fruits and vegetables daily;Weight Loss: Understanding of general recommendations for a balanced deficit meal plan, which promotes 1-2 lb weight loss per week and includes a negative energy balance of [phone] kcal/d;Understanding recommendations for meals to include 15-35% energy as protein, 25-35% energy from fat, 35-60% energy from carbohydrates, less than 2036mof dietary cholesterol, 20-35 gm of total fiber daily;Understanding of distribution of calorie intake throughout the day with the consumption of 4-5 meals/snacks   Sedentary Yes   Intervention Provide advice, education, support and counseling about physical activity/exercise needs.;Develop an individualized exercise prescription for aerobic and resistive training based on initial evaluation findings, risk stratification, comorbidities and participant's personal goals.   Expected Outcomes Achievement of increased cardiorespiratory fitness and enhanced flexibility, muscular endurance and strength shown through measurements of functional capacity and personal statement of participant.   Increase Strength and Stamina Yes   Intervention Provide advice, education, support and counseling about physical activity/exercise needs.;Develop an individualized exercise prescription for aerobic and resistive training based on  initial evaluation findings, risk stratification, comorbidities and participant's personal goals.   Improve shortness of breath with ADL's Yes   Intervention Provide education, individualized exercise plan and daily activity instruction to help decrease symptoms of SOB with activities of daily living.   Expected Outcomes Short Term: Achieves a reduction of symptoms when performing activities of daily living.   Diabetes Yes   Intervention Provide education about signs/symptoms and action to take for hypo/hyperglycemia.;Provide education about proper nutrition, including hydration, and aerobic/resistive exercise prescription along with prescribed medications to achieve blood glucose in normal ranges: Fasting glucose 65-99 mg/dL   Expected Outcomes Short Term: Participant verbalizes understanding of the signs/symptoms and immediate care of hyper/hypoglycemia, proper foot care and importance of medication, aerobic/resistive exercise and nutrition plan for blood glucose control.;Long Term: Attainment of HbA1C < 7%.   Hypertension Yes   Intervention Provide education on lifestyle modifcations including regular physical activity/exercise, weight management, moderate sodium restriction and increased consumption of fresh fruit, vegetables, and low fat dairy, alcohol moderation, and smoking cessation.;Monitor prescription use compliance.   Expected Outcomes Short Term: Continued assessment and intervention until BP is < 140/9015mG in hypertensive participants. < 130/13m19m in hypertensive participants with diabetes, heart failure or chronic kidney disease.;Long Term: Maintenance of blood pressure at goal levels.   Lipids Yes   Intervention Provide education and support for participant on nutrition & aerobic/resistive exercise along with prescribed medications to achieve LDL <70mg25mL >40mg.33mxpected Outcomes Long Term: Cholesterol controlled with medications as prescribed, with individualized exercise RX and  with personalized nutrition plan. Value goals: LDL < 70mg, 71m> 40 mg.;Short Term: Participant states understanding of desired cholesterol values and is compliant with medications prescribed. Participant is following exercise prescription and nutrition guidelines.   Stress Yes   Intervention Offer individual and/or small group education and counseling on adjustment to heart disease, stress management and health-related lifestyle change. Teach and support self-help strategies.;Refer participants experiencing significant psychosocial distress to appropriate mental health specialists for further evaluation and treatment. When possible, include family members and significant others in education/counseling sessions.   Expected  Outcomes Short Term: Participant demonstrates changes in health-related behavior, relaxation and other stress management skills, ability to obtain effective social support, and compliance with psychotropic medications if prescribed.;Long Term: Emotional wellbeing is indicated by absence of clinically significant psychosocial distress or social isolation.   Personal Goal Other Yes   Personal Goal Better limitations and understanding of activity levels/exercise. Better control and management of diabetes   Intervention Provide nutrition counseling on diabetes and heart healthy diet. Provide exercise programming to assist with improving overall fitness level.   Expected Outcomes Pt will have better understanding and management of diabetes. Pt will also have an established exercise routine and feel comfortable with activity/exertion.      Core Components/Risk Factors/Patient Goals Review:    Core Components/Risk Factors/Patient Goals at Discharge (Final Review):    ITP Comments:     ITP Comments    Row Name 12/10/16 0907 12/18/16 0826         ITP Comments Dr. Fransico Him, Medical Director Attended CPR education class, Met outcomes/goals          Comments: Pt is making  slower than expected progress toward personal goals after completing 2 sessions. Some sessions have been withheld due to uncontrolled diabetes and dietary indiscretions.   Recommend continued exercise and life style modification education including  stress management and relaxation techniques to decrease cardiac risk profile.

## 2016-12-25 ENCOUNTER — Encounter (HOSPITAL_COMMUNITY): Payer: Medicaid Other

## 2016-12-25 ENCOUNTER — Telehealth: Payer: Self-pay | Admitting: Interventional Cardiology

## 2016-12-25 MED ORDER — AMLODIPINE BESYLATE 5 MG PO TABS: 5.0000 mg | ORAL_TABLET | Freq: Every day | ORAL | 3 refills | Status: DC

## 2016-12-25 NOTE — Telephone Encounter (Signed)
Lindsay Records, MD  Julio Sicks, LPN        Please start this patient on Amlodipine 5 mg daily. Let her know this is related to elevated BP in Rehab.    Spoke with pt and made her aware of Dr. Michaelle Copas recommendations.  Pt had Amlodipine 10mg  QD on her list.  Asked about this and she said she has not been taking that due to it being d/c'ed when she left the hospital.  Advised pt that I would send in prescription and to call our office if she has any issues with the medication.  Pt appreciative for assistance.

## 2016-12-28 ENCOUNTER — Telehealth (HOSPITAL_COMMUNITY): Payer: Self-pay | Admitting: Internal Medicine

## 2016-12-28 ENCOUNTER — Encounter (HOSPITAL_COMMUNITY): Payer: Medicaid Other

## 2016-12-30 ENCOUNTER — Telehealth (HOSPITAL_COMMUNITY): Payer: Self-pay | Admitting: Cardiac Rehabilitation

## 2016-12-30 ENCOUNTER — Encounter (HOSPITAL_COMMUNITY): Payer: Medicaid Other

## 2016-12-30 NOTE — Telephone Encounter (Signed)
Phone message received from pt she has been absent from cardiac rehab due to flu like symptoms and inclement weather school closures.  Returned call, no answer.  Left message on answering machine

## 2017-01-01 ENCOUNTER — Encounter (HOSPITAL_COMMUNITY): Admission: RE | Admit: 2017-01-01 | Payer: Medicaid Other | Source: Ambulatory Visit

## 2017-01-04 ENCOUNTER — Encounter (HOSPITAL_COMMUNITY): Payer: Medicaid Other

## 2017-01-06 ENCOUNTER — Encounter (HOSPITAL_COMMUNITY): Payer: Medicaid Other

## 2017-01-08 ENCOUNTER — Encounter (HOSPITAL_COMMUNITY): Payer: Medicaid Other

## 2017-01-11 ENCOUNTER — Encounter (HOSPITAL_COMMUNITY): Payer: Medicaid Other

## 2017-01-11 ENCOUNTER — Telehealth (HOSPITAL_COMMUNITY): Payer: Self-pay | Admitting: Cardiac Rehabilitation

## 2017-01-11 NOTE — Telephone Encounter (Signed)
pc received from pt stating her cold and flu like symptoms have resolved.  Pt is ready to return to cardiac rehab.  However her [id number] today.  Pt advised to contact her PCP for advice about hyperglycemia.  Pt also requests to change cardiac rehab class time due to getting her children to school.  Pt r/s to 11:15 class to begin at next scheduled visit as long as CBG appropriate for exercise.  Pt verbalized understanding.

## 2017-01-13 ENCOUNTER — Telehealth (HOSPITAL_COMMUNITY): Payer: Self-pay | Admitting: Cardiac Rehabilitation

## 2017-01-13 ENCOUNTER — Ambulatory Visit (HOSPITAL_COMMUNITY): Payer: Self-pay | Admitting: Cardiac Rehabilitation

## 2017-01-13 ENCOUNTER — Encounter (HOSPITAL_COMMUNITY): Admission: RE | Admit: 2017-01-13 | Payer: Medicaid Other | Source: Ambulatory Visit

## 2017-01-13 NOTE — Telephone Encounter (Signed)
pc to pt to discuss continued absence from cardiac rehab. Pt reports continued hyperglycemia >300 on exercise days.  Pt states she has upcoming scheduled appointment with Dr. Lucianne Muss 01/18/17 .  Pt current cardiac rehab appointments cancelled and rescheduled for later date.  Pt verbalized understanding.

## 2017-01-15 ENCOUNTER — Encounter (HOSPITAL_COMMUNITY): Admission: RE | Admit: 2017-01-15 | Payer: Medicaid Other | Source: Ambulatory Visit

## 2017-01-15 ENCOUNTER — Encounter (HOSPITAL_COMMUNITY): Payer: Medicaid Other

## 2017-01-18 ENCOUNTER — Encounter (HOSPITAL_COMMUNITY): Payer: Medicaid Other

## 2017-01-18 ENCOUNTER — Encounter: Payer: Self-pay | Admitting: Endocrinology

## 2017-01-18 ENCOUNTER — Ambulatory Visit (INDEPENDENT_AMBULATORY_CARE_PROVIDER_SITE_OTHER): Payer: Medicaid Other | Admitting: Endocrinology

## 2017-01-18 VITALS — BP 134/88 | HR 88 | Ht 68.0 in | Wt 383.0 lb

## 2017-01-18 DIAGNOSIS — E1165 Type 2 diabetes mellitus with hyperglycemia: Secondary | ICD-10-CM

## 2017-01-18 DIAGNOSIS — Z794 Long term (current) use of insulin: Secondary | ICD-10-CM

## 2017-01-18 LAB — COMPREHENSIVE METABOLIC PANEL
ALK PHOS: 109 U/L (ref 39–117)
ALT: 14 U/L (ref 0–35)
AST: 15 U/L (ref 0–37)
Albumin: 3.7 g/dL (ref 3.5–5.2)
BILIRUBIN TOTAL: 0.4 mg/dL (ref 0.2–1.2)
BUN: 17 mg/dL (ref 6–23)
CALCIUM: 9.4 mg/dL (ref 8.4–10.5)
CO2: 24 mEq/L (ref 19–32)
Chloride: 101 mEq/L (ref 96–112)
Creatinine, Ser: 0.81 mg/dL (ref 0.40–1.20)
GFR: 103.27 mL/min (ref >60.00)
Glucose, Bld: 395 mg/dL — ABNORMAL HIGH (ref 70–99)
Potassium: 4.5 mEq/L (ref 3.5–5.1)
Sodium: 131 mEq/L — ABNORMAL LOW (ref 135–145)
Total Protein: 7 g/dL (ref 6.0–8.3)

## 2017-01-18 LAB — POCT GLYCOSYLATED HEMOGLOBIN (HGB A1C): Hemoglobin A1C: 12.5

## 2017-01-18 MED ORDER — EMPAGLIFLOZIN 10 MG PO TABS: 10.0000 mg | ORAL_TABLET | Freq: Every day | ORAL | 3 refills | Status: DC

## 2017-01-18 MED ORDER — INSULIN REGULAR HUMAN (CONC) 500 UNIT/ML ~~LOC~~ SOPN: PEN_INJECTOR | SUBCUTANEOUS | 0 refills | Status: DC

## 2017-01-18 NOTE — Patient Instructions (Signed)
BLOOD glucose monitoring:  Start checking blood sugar daily on waking up and at least once before other meals and once at bedtime  NEW insulin: Humulin R U-500: This will replace NovoLog  Start taking 70 units about 30 minutes before each meal and 50 units at bedtime  With this also continue LANTUS insulin, taking only 60 units at bedtime once a day   LANTUS insulin: If not able to get the Humulin R then you will need to increase the dose to 80 units twice a day  JARDIANCE: Start taking 1 tablet in the morning daily

## 2017-01-18 NOTE — Progress Notes (Signed)
Patient ID: Lindsay [name], female   DOB: 1981/05/27, 36 y.o.   MRN: [id number]           Reason for Appointment: Type II Diabetes    History of Present Illness   Diagnosis date: 2004  Previous history:  On insulin since about 2008 No prior records are available  Recent history:  Insulin regimen: Lantus   56 bid, Novolog 10 units 4x daily ac hs  Her A1c is 12.4 today, previously 13.1 and 12.4 in 2017  Current management, blood sugar patterns and problems identified:  She has not been followed regularly for her diabetes and appears to have had persistently poor control  She has taking relatively large doses of Lantus without any adjustment in doses now  At times she will feel increased thirst and urination and blood sugars have been occasionally over 400  She is taking only 10 units of Novolog 4 times a day and she thinks that large doses of make her blood sugars get low  She had been on metformin also but she tends to have diarrhea and has not taken this lately, has not taken any other medications with insulin     Oral hypoglycemic drugs:        Side effects from medications: None        Proper timing of medications in relation to meals: Yes.          Monitors blood glucose: Once a day.    Glucometer:  Accu-Chek  Blood Glucose readings from meter download:  Mean values apply above for all meters except median for One Touch  PRE-MEAL Fasting Lunch Dinner Overnight  Overall  Glucose range: 246-443  347  221-389  2 86-467    Mean/median: 330     333     Hypoglycemia:  none         Meals: 3 meals per day. Drinks water, Avoiding drinks with sugar Does not have a specific meal plan          Physical activity: exercise: Minimal            Dietician visit: Most recent: never     Weight control:  Wt Readings from Last 3 Encounters:  01/18/17 (!) 383 lb (173.7 kg)  12/10/16 (!) 389 lb 12.4 oz (176.8 kg)  12/07/16 (!) 388 lb (176 kg)           Diabetes  labs:  Lab Results  Component Value Date   HGBA1C 12.5 01/18/2017   HGBA1C 12.4 (H) 09/08/2016   HGBA1C 13.1 06/10/2016   Lab Results  Component Value Date   LDLCALC 54 10/28/2016   CREATININE 0.71 09/16/2016     Allergies as of 01/18/2017      Reactions   Hydrazine Yellow [tartrazine] Shortness Of Breath, Swelling   Swelling mostly noticed in legs and feet, retaining urination, shortness of breaht, and minor chest pain   Lisinopril Shortness Of Breath   Was on prinzide; had sob/chest pain on it.   Tylenol [acetaminophen] Itching, Swelling   Itching of the mouth, swelling of tongue and stomach started hurting      Medication List       Accurate as of 01/18/17 12:46 PM. Always use your most recent med list.          ACCU-CHEK AVIVA PLUS w/Device Kit Use as directed for 4 times daily blood glucose monitoring   accu-chek softclix lancets Use as instructed for 4 times daily blood glucose monitoring  ACCU-CHEK SOFTCLIX LANCETS lancets Use as instructed for 4 times daily blood glucose monitoring   albuterol 108 (90 Base) MCG/ACT inhaler Commonly known as:  PROVENTIL HFA;VENTOLIN HFA Inhale 2 puffs into the lungs every 6 (six) hours as needed for wheezing or shortness of breath.   amLODipine 5 MG tablet Commonly known as:  NORVASC Take 1 tablet (5 mg total) by mouth daily.   aspirin 81 MG tablet Take 81 mg by mouth daily.   atorvastatin 80 MG tablet Commonly known as:  LIPITOR Take 1 tablet (80 mg total) by mouth daily at 6 PM.   empagliflozin 10 MG Tabs tablet Commonly known as:  JARDIANCE Take 10 mg by mouth daily with breakfast.   gabapentin 300 MG capsule Commonly known as:  NEURONTIN Take 2 capsules (600 mg total) by mouth 4 (four) times daily.   glucose blood test strip Commonly known as:  ACCU-CHEK AVIVA PLUS Use as instructed for 4 times daily blood glucose monitoring   insulin glargine 100 unit/mL Sopn Commonly known as:  LANTUS Inject 0.42 mLs  (42 Units total) into the skin 2 (two) times daily.   Insulin Pen Needle 31G X 5 MM Misc 1 application by Does not apply route Nightly.   insulin regular human CONCENTRATED 500 UNIT/ML kwikpen Commonly known as:  HUMULIN R U-500 KWIKPEN 70 Units, 30 minutes before each meal and 50 units at bedtime   losartan 25 MG tablet Commonly known as:  COZAAR Take 1 tablet (25 mg total) by mouth daily.   medroxyPROGESTERone 150 MG/ML injection Commonly known as:  DEPO-PROVERA Inject 150 mg into the muscle every 3 (three) months.   meloxicam 15 MG tablet Commonly known as:  MOBIC TAKE ONE TABLET BY MOUTH ONCE DAILY   metFORMIN 1000 MG tablet Commonly known as:  GLUCOPHAGE Take 1 tablet (1,000 mg total) by mouth 2 (two) times daily with a meal.   methocarbamol 500 MG tablet Commonly known as:  ROBAXIN Take 1 tablet (500 mg total) by mouth 4 (four) times daily.   metoprolol 50 MG tablet Commonly known as:  LOPRESSOR Take 1 tablet (50 mg total) by mouth 2 (two) times daily.   nitroGLYCERIN 0.4 MG SL tablet Commonly known as:  NITROSTAT Place 1 tablet (0.4 mg total) under the tongue every 5 (five) minutes as needed for chest pain.   NOVOLOG FLEXPEN 100 UNIT/ML FlexPen Generic drug:  insulin aspart INJECT 10 UNITS SUBCUTANEOUSLY THREE TIMES DAILY WITH MEALS   PRENATAL MULTIVITAMIN/IRON PO Take 1 tablet by mouth daily.   ticagrelor 90 MG Tabs tablet Commonly known as:  BRILINTA Take 1 tablet (90 mg total) by mouth 2 (two) times daily.       Allergies:  Allergies  Allergen Reactions  . Hydrazine Yellow [Tartrazine] Shortness Of Breath and Swelling    Swelling mostly noticed in legs and feet, retaining urination, shortness of breaht, and minor chest pain  . Lisinopril Shortness Of Breath    Was on prinzide; had sob/chest pain on it.  . Tylenol [Acetaminophen] Itching and Swelling    Itching of the mouth, swelling of tongue and stomach started hurting    Past Medical History:   Diagnosis Date  . ARF (acute renal failure) (HCC) 04/2015  . Asthma   . Cellulitis of right upper extremity   . Coronary artery disease   . Diabetes mellitus    insulin dependent  . Hyperlipidemia LDL goal <70   . Hypertension   . NSTEMI (non-ST elevated myocardial infarction) (  HCC) 08/2016  . Obesity   . S/P angioplasty with stent 08/2016   DES to mLAD and PTCA only to 2nd diag ostium.     Past Surgical History:  Procedure Laterality Date  . CARDIAC CATHETERIZATION N/A 09/07/2016   Procedure: Left Heart Cath and Coronary Angiography;  Surgeon: Lyn Records, MD;  Location: [facility] INVASIVE CV LAB;  Service: Cardiovascular;  Laterality: N/A;  . CARDIAC CATHETERIZATION N/A 09/07/2016   Procedure: Coronary Stent Intervention;  Surgeon: Lyn Records, MD;  Location: [facility] INVASIVE CV LAB;  Service: Cardiovascular;  Laterality: N/A;  . CARDIAC CATHETERIZATION N/A 09/07/2016   Procedure: Coronary Balloon Angioplasty;  Surgeon: Lyn Records, MD;  Location: [facility] INVASIVE CV LAB;  Service: Cardiovascular;  Laterality: N/A;  . CESAREAN SECTION    . IRRIGATION AND DEBRIDEMENT SHOULDER Right 04/30/2015   Procedure: IRRIGATION AND DEBRIDEMENT SHOULDER;  Surgeon: Sheral Apley, MD;  Location: [facility] OR;  Service: Orthopedics;  Laterality: Right;  . IRRIGATION AND DEBRIDEMENT SHOULDER Right 05/01/2015  . LEG SURGERY    . SHOULDER ARTHROSCOPY Right 04/30/2015   Procedure: ARTHROSCOPY SHOULDER;  Surgeon: Sheral Apley, MD;  Location: [facility] OR;  Service: Orthopedics;  Laterality: Right;  . TONSILLECTOMY      Family History  Problem Relation Age of Onset  . Diabetes Mother   . Hypertension Mother   . Heart disease Mother   . Diabetes Father   . Heart disease Father   . Stroke Maternal Grandmother   . Cancer Maternal Grandmother     Social History:  reports that she has never smoked. She has never used smokeless tobacco. She reports that she drinks alcohol. She reports that she does not use  drugs.  Review of Systems:  Hypertension:  She is on metformin and Lopressor  Lipids: Has been treated with Lipitor 80 mg because of history of CAD  Lab Results  Component Value Date   CHOL 124 10/28/2016   HDL 35 (L) 10/28/2016   LDLCALC 54 10/28/2016   TRIG 174 (H) 10/28/2016   CHOLHDL 3.5 10/28/2016      LABS:  Office Visit on 01/18/2017  Component Date Value Ref Range Status  . Hemoglobin A1C 01/18/2017 12.5   Final     Examination:   BP 134/88   Pulse 88   Ht 5\' 8"  (1.727 m)   Wt (!) 383 lb (173.7 kg)   SpO2 98%   BMI 58.23 kg/m   Body mass index is 58.23 kg/m.   No ankle edema present  ASSESSMENT/ PLAN:    Diabetes type 2:   See history of present illness for detailed discussion of current diabetes management, blood sugar patterns and problems identified  Blood glucose control has been persistently poor for at least a year and she has had no consistent diabetes care follow-up She is highly insulin resistant and even with taking about 150 units of insulin daily her blood sugars are averaging well over 300 She reports intolerance to metformin Also has had minimal diabetes education  Not clear if she has taken a GLP-1 drug previously; also not clear if she has  had any benefits from metformin previously   RECOMMENDATIONS:  Consultation with dietitian  Increase his total insulin doses and trial of U-500 insulin for better efficacy.  She was explained in detail the nature of concentrated insulin, timing of injection and dosage adjustment  She will start with 50 units of the U-500 insulin 30 min before each meal and 30 units  at bedtime  She will cut her Lantus insulin dose down to 60 units once a for now  Discussed timing and frequency of glucose monitoring and blood sugar targets  Encouraged her to start regular exercise  She will also have a short-term follow-up with diabetes educator  She is also good candidate for medication like  Jardiance Discussed action of SGLT 2 drugs on lowering glucose by decreasing kidney absorption of glucose, benefits of weight loss and lower blood pressure, possible side effects including candidiasis and dosage regimen.  She will start with 10 mg Jardiance once a day.  Will need baseline chemistry panel today     Patient Instructions  BLOOD glucose monitoring:  Start checking blood sugar daily on waking up and at least once before other meals and once at bedtime  NEW insulin: Humulin R U-500: This will replace NovoLog  Start taking 70 units about 30 minutes before each meal and 50 units at bedtime  With this also continue LANTUS insulin, taking only 60 units at bedtime once a day   LANTUS insulin: If not able to get the Humulin R then you will need to increase the dose to 80 units twice a day  JARDIANCE: Start taking 1 tablet in the morning daily   Counseling time on subjects discussed above is over 50% of today's 30 minute visit   Jerrico Covello 01/18/2017, 12:46 PM

## 2017-01-20 ENCOUNTER — Encounter (HOSPITAL_COMMUNITY): Payer: Medicaid Other

## 2017-01-22 ENCOUNTER — Encounter (HOSPITAL_COMMUNITY): Payer: Medicaid Other

## 2017-01-25 ENCOUNTER — Encounter (HOSPITAL_COMMUNITY): Payer: Medicaid Other

## 2017-01-27 ENCOUNTER — Encounter (HOSPITAL_COMMUNITY): Payer: Medicaid Other

## 2017-01-27 ENCOUNTER — Telehealth: Payer: Self-pay | Admitting: Endocrinology

## 2017-01-27 ENCOUNTER — Other Ambulatory Visit: Payer: Self-pay | Admitting: Internal Medicine

## 2017-01-27 NOTE — Telephone Encounter (Signed)
Refill of insulin glargine (LANTUS) 100 unit/mL SOPN   need a PA for this insulin  insulin regular human CONCENTRATED (HUMULIN R U-500 KWIKPEN) 500 UNIT/ML Kerrin Mo to  [facility] [place], [place] - [address] [phone] (Phone) [phone] (Fax)

## 2017-01-29 ENCOUNTER — Encounter (HOSPITAL_COMMUNITY): Payer: Medicaid Other

## 2017-01-29 ENCOUNTER — Other Ambulatory Visit: Payer: Self-pay | Admitting: Internal Medicine

## 2017-01-29 ENCOUNTER — Other Ambulatory Visit: Payer: Self-pay

## 2017-01-29 MED ORDER — BASAGLAR KWIKPEN 100 UNIT/ML ~~LOC~~ SOPN: PEN_INJECTOR | SUBCUTANEOUS | 0 refills | Status: DC

## 2017-01-29 NOTE — Telephone Encounter (Signed)
RX submitted.

## 2017-01-29 NOTE — Telephone Encounter (Signed)
Okay to send in basaglar in place of lantus. Please advise. Thank you. Submitted Humulin pen to pharmacy.

## 2017-01-29 NOTE — Telephone Encounter (Signed)
Basaglar is acceptable.  Please make follow-up appointment with me, this has not been made with labs

## 2017-02-01 ENCOUNTER — Ambulatory Visit: Payer: Medicaid Other | Admitting: Endocrinology

## 2017-02-01 ENCOUNTER — Encounter (HOSPITAL_COMMUNITY): Payer: Medicaid Other

## 2017-02-01 ENCOUNTER — Telehealth: Payer: Self-pay

## 2017-02-01 NOTE — Telephone Encounter (Signed)
Called patient. She is needing PA for humulin R which we will start today. Patient understood and had no questions at this time.

## 2017-02-01 NOTE — Telephone Encounter (Signed)
Will submit PA for humulin R

## 2017-02-01 NOTE — Telephone Encounter (Signed)
PA for both humulin and basaglar have been received via fax, in progress

## 2017-02-01 NOTE — Telephone Encounter (Signed)
Pt called in and said that [facility] has not received the script for the Lantus yet and she needs that to be resubmitted.  Also she was checking on the status of the other prescription that was requiring a PA.

## 2017-02-02 ENCOUNTER — Encounter (HOSPITAL_COMMUNITY)
Admission: RE | Admit: 2017-02-02 | Discharge: 2017-02-02 | Disposition: A | Discharge status: Home / Self Care | Payer: Medicaid Other | Source: Ambulatory Visit | Attending: Interventional Cardiology | Admitting: Interventional Cardiology

## 2017-02-02 ENCOUNTER — Encounter (HOSPITAL_COMMUNITY): Payer: Self-pay

## 2017-02-02 ENCOUNTER — Other Ambulatory Visit (HOSPITAL_COMMUNITY): Payer: Self-pay | Admitting: *Deleted

## 2017-02-02 ENCOUNTER — Other Ambulatory Visit: Payer: Self-pay

## 2017-02-02 ENCOUNTER — Encounter: Payer: Medicaid Other | Attending: Endocrinology | Admitting: Nutrition

## 2017-02-02 VITALS — BP 122/84 | HR 96 | Ht 67.0 in | Wt 389.6 lb

## 2017-02-02 DIAGNOSIS — E1165 Type 2 diabetes mellitus with hyperglycemia: Secondary | ICD-10-CM | POA: Insufficient documentation

## 2017-02-02 DIAGNOSIS — Z713 Dietary counseling and surveillance: Secondary | ICD-10-CM | POA: Diagnosis not present

## 2017-02-02 DIAGNOSIS — Z955 Presence of coronary angioplasty implant and graft: Secondary | ICD-10-CM | POA: Diagnosis present

## 2017-02-02 DIAGNOSIS — I219 Acute myocardial infarction, unspecified: Secondary | ICD-10-CM | POA: Diagnosis not present

## 2017-02-02 DIAGNOSIS — Z794 Long term (current) use of insulin: Secondary | ICD-10-CM | POA: Diagnosis not present

## 2017-02-02 DIAGNOSIS — E118 Type 2 diabetes mellitus with unspecified complications: Secondary | ICD-10-CM

## 2017-02-02 DIAGNOSIS — I214 Non-ST elevation (NSTEMI) myocardial infarction: Secondary | ICD-10-CM

## 2017-02-02 MED ORDER — INSULIN GLARGINE 100 UNIT/ML SOLOSTAR PEN: PEN_INJECTOR | SUBCUTANEOUS | 1 refills | Status: DC

## 2017-02-02 MED ORDER — INSULIN REGULAR HUMAN (CONC) 500 UNIT/ML ~~LOC~~ SOLN: SUBCUTANEOUS | 2 refills | Status: DC

## 2017-02-02 MED ORDER — "INSULIN SYRINGE-NEEDLE U-100 31G X 15/64"" 1 ML MISC": 1 refills | Status: DC

## 2017-02-02 NOTE — Patient Instructions (Signed)
Take U 500 R insulin via syringe at 70u 30 min. Before meals, and 50u at bedtime. Take Lantus 60u at bedtime.

## 2017-02-02 NOTE — Progress Notes (Signed)
Cardiac Rehab Medication Review by a Pharmacist  Does the patient  feel that his/her medications are working for him/her? Yes  Has the patient been experiencing any side effects to the medications prescribed? No   Does the patient measure his/her own blood pressure or blood glucose at home? Yes - recording sugars   Does the patient have any problems obtaining medications due to transportation or finances?  No   Understanding of regimen: Good Understanding of indications: Good Potential of compliance: Good   Pharmacist comments:   Patient presents ambulating unassisted and in good spirits today. Medication indications, dosing, frequency, and notable side effects reviewed with patient. Patient verbalized several medications changes with her insulin in the last 1-2 weeks. She is currently only taking Lantus and is awaiting approval for both her Basaglar and Humulin -R. Encouraged her to let us know when she makes this change so we can update her medication list. Time offered for discussion with questions. Patient reports no further questions or concerns at conclusion of our visit.    York Cerise, PharmD Pharmacy Resident  Pager [phone] 02/02/17 8:02 AM

## 2017-02-02 NOTE — Progress Notes (Signed)
Pt. Did not start on start on new u500 insulin, because it was never authorized, despite sending paperwork over on 01/21/17.  Pt. Said she was not able to get her Lantus or R insulin.  She has not tested her blood sugar this morning, but says yesterday it was over 300.   I phoned and got the autorization and reviewed the insulins, and the doses.  Written instructions were given for U-500 R insulin--70u 30 min. Before each meal and 50u at bedtime. Since medicaid only covers the Vials, she was shown the vial, and given 1.cc insulin syringes to use with this.  Also Lantus insulin 60u at bedtime. She denies drinking sweet drinks, or fruit juices.  She sometimes eats cold cereal and milk, maybe once a week.  She was told to eat toast with cheese or peanut butter in place of the cereal.  She agreed to do this.

## 2017-02-02 NOTE — Progress Notes (Signed)
Cardiac Individual Treatment Plan  Patient Details  Name: KEVIN MARIO MRN: [id number] Date of Birth: 1981-05-28 Referring Provider:   Flowsheet Row CARDIAC REHAB PHASE II ORIENTATION from 02/02/2017 in [facility]  Referring Provider  Daneen Schick, MD      Initial Encounter Date:  Flowsheet Row CARDIAC REHAB PHASE II ORIENTATION from 02/02/2017 in [facility]  Date  02/02/17  Referring Provider  Daneen Schick, MD      Visit Diagnosis: 09/06/16 NSTEMI (non-ST elevated myocardial infarction) [facility]) - Plan: Amb Referral to Cardiac Rehabilitation  Patient's Home Medications on Admission:  Current Outpatient Prescriptions:  .  ACCU-CHEK SOFTCLIX LANCETS lancets, Use as instructed for 4 times daily blood glucose monitoring, Disp: 100 each, Rfl: 12 .  albuterol (PROVENTIL HFA;VENTOLIN HFA) 108 (90 Base) MCG/ACT inhaler, Inhale 2 puffs into the lungs every 6 (six) hours as needed for wheezing or shortness of breath., Disp: 1 Inhaler, Rfl: 2 .  amLODipine (NORVASC) 5 MG tablet, Take 1 tablet (5 mg total) by mouth daily., Disp: 90 tablet, Rfl: 3 .  aspirin 81 MG tablet, Take 81 mg by mouth daily., Disp: , Rfl:  .  atorvastatin (LIPITOR) 80 MG tablet, Take 1 tablet (80 mg total) by mouth daily at 6 PM., Disp: 30 tablet, Rfl: 6 .  Blood Glucose Monitoring Suppl (ACCU-CHEK AVIVA PLUS) w/Device KIT, Use as directed for 4 times daily blood glucose monitoring, Disp: 1 kit, Rfl: 0 .  empagliflozin (JARDIANCE) 10 MG TABS tablet, Take 10 mg by mouth daily with breakfast., Disp: 30 tablet, Rfl: 3 .  gabapentin (NEURONTIN) 300 MG capsule, Take 2 capsules (600 mg total) by mouth 4 (four) times daily., Disp: 240 capsule, Rfl: 3 .  glucose blood (ACCU-CHEK AVIVA PLUS) test strip, Use as instructed for 4 times daily blood glucose monitoring, Disp: 100 each, Rfl: 12 .  Insulin Pen Needle 31G X 5 MM MISC, 1 application by Does not apply route  Nightly., Disp: 1000 each, Rfl: 3 .  Lancet Devices (ACCU-CHEK SOFTCLIX) lancets, Use as instructed for 4 times daily blood glucose monitoring, Disp: 1 each, Rfl: 0 .  losartan (COZAAR) 25 MG tablet, Take 1 tablet (25 mg total) by mouth daily., Disp: 30 tablet, Rfl: 6 .  medroxyPROGESTERone (DEPO-PROVERA) 150 MG/ML injection, Inject 150 mg into the muscle every 3 (three) months., Disp: , Rfl:  .  meloxicam (MOBIC) 15 MG tablet, TAKE ONE TABLET BY MOUTH ONCE DAILY .MUST  HAVE  OFFICE  VISIT  FOR  REFILLS, Disp: 30 tablet, Rfl: 0 .  methocarbamol (ROBAXIN) 500 MG tablet, TAKE ONE TABLET BY MOUTH 4 TIMES DAILY, Disp: 120 tablet, Rfl: 2 .  metoprolol tartrate (LOPRESSOR) 50 MG tablet, Take 1 tablet (50 mg total) by mouth 2 (two) times daily., Disp: 120 tablet, Rfl: 3 .  nitroGLYCERIN (NITROSTAT) 0.4 MG SL tablet, Place 1 tablet (0.4 mg total) under the tongue every 5 (five) minutes as needed for chest pain., Disp: 25 tablet, Rfl: 12 .  Prenatal Vit-Fe Fumarate-FA (PRENATAL MULTIVITAMIN/IRON PO), Take 1 tablet by mouth daily., Disp: , Rfl:  .  ticagrelor (BRILINTA) 90 MG TABS tablet, Take 1 tablet (90 mg total) by mouth 2 (two) times daily., Disp: 60 tablet, Rfl: 11 .  Insulin Glargine (LANTUS SOLOSTAR) 100 UNIT/ML Solostar Pen, Inject 60 units at bedtime., Disp: 10 pen, Rfl: 1 .  insulin regular human CONCENTRATED (HUMULIN R) 500 UNIT/ML injection, 70 units 30 minutes before each meal; 50  at bedtime., Disp: 80 mL, Rfl: 2 .  Insulin Syringe-Needle U-100 (BD INSULIN SYRINGE ULTRAFINE) 31G X 15/64" 1 ML MISC, Use to inject insulin daily, Disp: 100 each, Rfl: 1 .  NOVOLOG FLEXPEN 100 UNIT/ML FlexPen, INJECT 10 UNITS SUBCUTANEOUSLY THREE TIMES DAILY WITH MEALS (Patient not taking: Reported on 02/02/2017), Disp: 15 mL, Rfl: 3  Past Medical History: Past Medical History:  Diagnosis Date  . ARF (acute renal failure) ([place]) 04/2015  . Asthma   . Cellulitis of right upper extremity   . Coronary artery disease    . Diabetes mellitus    insulin dependent  . Hyperlipidemia LDL goal <70   . Hypertension   . NSTEMI (non-ST elevated myocardial infarction) ([place]) 08/2016  . Obesity   . S/P angioplasty with stent 08/2016   DES to mLAD and PTCA only to 2nd diag ostium.     Tobacco Use: History  Smoking Status  . Never Smoker  Smokeless Tobacco  . Never Used    Labs: Recent Review Flowsheet Data    Labs for ITP Cardiac and Pulmonary Rehab Latest Ref Rng & Units 06/19/2016 09/07/2016 09/08/2016 10/28/2016 01/18/2017   Cholestrol <200 mg/dL 212(H) 201(H) - 124 -   LDLCALC mg/dL 113 117(H) - 54 -   HDL >50 mg/dL 52 40(L) - 35(L) -   Trlycerides <150 mg/dL 236(H) 219(H) - 174(H) -   Hemoglobin A1c - - - 12.4(H) - 12.5   HCO3 20.0 - 24.0 mEq/L - - - - -   TCO2 0 - 100 mmol/L - - - - -   ACIDBASEDEF 0.0 - 2.0 mmol/L - - - - -   O2SAT % - - - - -      Capillary Blood Glucose: Lab Results  Component Value Date   GLUCAP 252 (H) 12/18/2016   GLUCAP 299 (H) 12/18/2016   GLUCAP 236 (H) 12/16/2016   GLUCAP 317 (H) 12/16/2016   GLUCAP 230 (H) 09/09/2016     Exercise Target Goals: Date: 02/02/17  Exercise Program Goal: Individual exercise prescription set with THRR, safety & activity barriers. Participant demonstrates ability to understand and report RPE using BORG scale, to self-measure pulse accurately, and to acknowledge the importance of the exercise prescription.  Exercise Prescription Goal: Starting with aerobic activity 30 plus minutes a day, 3 days per week for initial exercise prescription. Provide home exercise prescription and guidelines that participant acknowledges understanding prior to discharge.  Activity Barriers & Risk Stratification:     Activity Barriers & Cardiac Risk Stratification - 02/02/17 1358      Activity Barriers & Cardiac Risk Stratification   Activity Barriers Deconditioning;Muscular Weakness;Balance Concerns;Back Problems;Other (comment)   Comments R shoulder  surgery, R backsided arm numbness and asthma   Cardiac Risk Stratification High      6 Minute Walk:     6 Minute Walk    Row Name 12/10/16 0934 12/10/16 1133 02/02/17 1038     6 Minute Walk   Phase Initial  - Initial   Distance 1300 feet  - 1164 feet   Walk Time 6 minutes  - 6 minutes   # of Rest Breaks 0  - 0   MPH  - 2.5 2.2   METS  - 3.6 2.99   RPE 11  - 13   VO2 Peak  - 12.6 10.46   Symptoms No  - Yes (comment)   Comments  -  - Pt c/o mild back pain towards the end of walk test.  Resting HR 99 bpm  - 96 bpm   Resting BP 153/97  - 122/84   Max Ex. HR 135 bpm  - 126 bpm   Max Ex. BP 167/95  - 142/92   2 Minute Post BP 142/90  - 133/81   Row Name 02/02/17 1351         6 Minute Walk   Phase Initial        Initial Exercise Prescription:     Initial Exercise Prescription - 02/02/17 1300      Date of Initial Exercise RX and Referring Provider   Date 02/02/17   Referring Provider Daneen Schick, MD     Recumbant Bike   Level 2   Minutes 10   METs 1.2     NuStep   Level 3   Minutes 10   METs 1.8     Track   Laps 7   Minutes 10   METs 2.23     Prescription Details   Frequency (times per week) 3   Duration Progress to 30 minutes of continuous aerobic without signs/symptoms of physical distress     Intensity   THRR 40-80% of Max Heartrate 74-148   Ratings of Perceived Exertion 11-13   Perceived Dyspnea 0-4     Progression   Progression Continue to progress workloads to maintain intensity without signs/symptoms of physical distress.     Resistance Training   Training Prescription Yes   Weight 3lbs   Reps 10-12      Perform Capillary Blood Glucose checks as needed.  Exercise Prescription Changes:      Exercise Prescription Changes    Row Name 12/22/16 1000             Exercise Review   Progression Yes         Response to Exercise   Blood Pressure (Admit) 142/92       Blood Pressure (Exercise) 142/92       Blood Pressure (Exit)  120/86       Heart Rate (Admit) 86 bpm       Heart Rate (Exercise) 109 bpm       Heart Rate (Exit) 85 bpm       Rating of Perceived Exertion (Exercise) 13       Symptoms none       Duration Progress to 30 minutes of continuous aerobic without signs/symptoms of physical distress       Intensity THRR unchanged         Progression   Progression Continue progressive overload as per policy without signs/symptoms or physical distress.       Average METs 2         Resistance Training   Training Prescription Yes       Weight 3lbs       Reps 10-12         Recumbant Bike   Level 2       Minutes 10       METs 1.2         NuStep   Level 3       Minutes 10       METs 1.8         Track   Laps 7       Minutes 10       METs 2.23          Exercise Comments:      Exercise Comments    Row Name 12/22/16 1013  Exercise Comments There are no changes to current Ex.Rx will continue to monitor exercise progression, and signs/symptoms of physical distress.          Discharge Exercise Prescription (Final Exercise Prescription Changes):     Exercise Prescription Changes - 12/22/16 1000      Exercise Review   Progression Yes     Response to Exercise   Blood Pressure (Admit) 142/92   Blood Pressure (Exercise) 142/92   Blood Pressure (Exit) 120/86   Heart Rate (Admit) 86 bpm   Heart Rate (Exercise) 109 bpm   Heart Rate (Exit) 85 bpm   Rating of Perceived Exertion (Exercise) 13   Symptoms none   Duration Progress to 30 minutes of continuous aerobic without signs/symptoms of physical distress   Intensity THRR unchanged     Progression   Progression Continue progressive overload as per policy without signs/symptoms or physical distress.   Average METs 2     Resistance Training   Training Prescription Yes   Weight 3lbs   Reps 10-12     Recumbant Bike   Level 2   Minutes 10   METs 1.2     NuStep   Level 3   Minutes 10   METs 1.8     Track   Laps 7    Minutes 10   METs 2.23      Nutrition:  Target Goals: Understanding of nutrition guidelines, daily intake of sodium <1529m, cholesterol <2035m calories 30% from fat and 7% or less from saturated fats, daily to have 5 or more servings of fruits and vegetables.  Biometrics:     Pre Biometrics - 02/02/17 1411      Pre Biometrics   Triceps Skinfold 66 mm       Nutrition Therapy Plan and Nutrition Goals:   Nutrition Discharge: Nutrition Scores:   Nutrition Goals Re-Evaluation:   Psychosocial: Target Goals: Acknowledge presence or absence of depression, maximize coping skills, provide positive support system. Participant is able to verbalize types and ability to use techniques and skills needed for reducing stress and depression.  Initial Review & Psychosocial Screening:     Initial Psych Review & Screening - 12/10/16 1418      Initial Review   Current issues with Current Stress Concerns   Comments rates medium stress level for health, family and finances      Family Dynamics   Concerns Inappropriate over/under dependence on family/friends   Comments Cares for her sisters children at times     Barriers   Psychosocial barriers to participate in program The patient should benefit from training in stress management and relaxation.     Screening Interventions   Interventions Encouraged to exercise      Quality of Life Scores:     Quality of Life - 12/16/16 1127      Quality of Life Scores   Health/Function Pre 18.64 %   Socioeconomic Pre 17.08 %   Psych/Spiritual Pre 22.93 %   Family Pre 22.38 %   GLOBAL Pre 19.79 %      PHQ-9: Recent Review Flowsheet Data    [facility] 12/16/2016 11/03/2016 09/28/2016 07/28/2016 06/10/2016   Decreased Interest 0 0 0 0 0   Down, Depressed, Hopeless 0 0 0 0 0   PHQ - 2 Score 0 0 0 0 0      Psychosocial Evaluation and Intervention:     Psychosocial Evaluation - 12/10/16 1425      Psychosocial Evaluation  &  Interventions   Interventions Stress management education   Comments Brief psychosocial assessment reveals concerns/issues with stress managament.   Continued Psychosocial Services Needed Yes      Psychosocial Re-Evaluation:     Psychosocial Re-Evaluation    Row Name 12/23/16 0707             Psychosocial Re-Evaluation   Interventions Encouraged to attend Cardiac Rehabilitation for the exercise;Stress management education;Relaxation education       Comments pt psychosocial barriers to rehab include blood sugar control, improved adherence to dietary recommendations and medication compliance.  financial barriers to exist.  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        Continued Psychosocial Services Needed Yes          Vocational Rehabilitation: Provide vocational rehab assistance to qualifying candidates.   Vocational Rehab Evaluation & Intervention:     Vocational Rehab - 12/18/16 1725      Initial Vocational Rehab Evaluation & Intervention   Assessment shows need for Vocational Rehabilitation Yes   Vocational Rehab Packet given to patient 12/18/16      Education: Education Goals: Education classes will be provided on a weekly basis, covering required topics. Participant will state understanding/return demonstration of topics presented.  Learning Barriers/Preferences:     Learning Barriers/Preferences - 02/02/17 0849      Learning Barriers/Preferences   Learning Barriers Sight   Learning Preferences Skilled Demonstration;Verbal Instruction;Video;Written Material;Pictoral      Education Topics: Count Your Pulse:  -Group instruction provided by verbal instruction, demonstration, patient participation and written materials to support subject.  Instructors address importance of being able to find your pulse and how to count your pulse when at home without a heart monitor.  Patients get hands on experience counting their pulse with  staff help and individually.   Heart Attack, Angina, and Risk Factor Modification:  -Group instruction provided by verbal instruction, video, and written materials to support subject.  Instructors address signs and symptoms of angina and heart attacks.    Also discuss risk factors for heart disease and how to make changes to improve heart health risk factors.   Functional Fitness:  -Group instruction provided by verbal instruction, demonstration, patient participation, and written materials to support subject.  Instructors address safety measures for doing things around the house.  Discuss how to get up and down off the floor, how to pick things up properly, how to safely get out of a chair without assistance, and balance training.   Meditation and Mindfulness:  -Group instruction provided by verbal instruction, patient participation, and written materials to support subject.  Instructor addresses importance of mindfulness and meditation practice to help reduce stress and improve awareness.  Instructor also leads participants through a meditation exercise.    Stretching for Flexibility and Mobility:  -Group instruction provided by verbal instruction, patient participation, and written materials to support subject.  Instructors lead participants through series of stretches that are designed to increase flexibility thus improving mobility.  These stretches are additional exercise for major muscle groups that are typically performed during regular warm up and cool down.   Hands Only CPR Anytime:  -Group instruction provided by verbal instruction, video, patient participation and written materials to support subject.  Instructors co-teach with AHA video for hands only CPR.  Participants get hands on experience with mannequins.   Nutrition I class: Heart Healthy Eating:  -Group instruction provided by PowerPoint slides, verbal discussion, and written materials to support  subject matter. The  instructor gives an explanation and review of the Therapeutic Lifestyle Changes diet recommendations, which includes a discussion on lipid goals, dietary fat, sodium, fiber, plant stanol/sterol esters, sugar, and the components of a well-balanced, healthy diet.   Nutrition II class: Lifestyle Skills:  -Group instruction provided by PowerPoint slides, verbal discussion, and written materials to support subject matter. The instructor gives an explanation and review of label reading, grocery shopping for heart health, heart healthy recipe modifications, and ways to make healthier choices when eating out.   Diabetes Question & Answer:  -Group instruction provided by PowerPoint slides, verbal discussion, and written materials to support subject matter. The instructor gives an explanation and review of diabetes co-morbidities, pre- and post-prandial blood glucose goals, pre-exercise blood glucose goals, signs, symptoms, and treatment of hypoglycemia and hyperglycemia, and foot care basics.   Diabetes Blitz:  -Group instruction provided by PowerPoint slides, verbal discussion, and written materials to support subject matter. The instructor gives an explanation and review of the physiology behind type 1 and type 2 diabetes, diabetes medications and rational behind using different medications, pre- and post-prandial blood glucose recommendations and Hemoglobin A1c goals, diabetes diet, and exercise including blood glucose guidelines for exercising safely.    Portion Distortion:  -Group instruction provided by PowerPoint slides, verbal discussion, written materials, and food models to support subject matter. The instructor gives an explanation of serving size versus portion size, changes in portions sizes over the last 20 years, and what consists of a serving from each food group.   Stress Management:  -Group instruction provided by verbal instruction, video, and written materials to support subject  matter.  Instructors review role of stress in heart disease and how to cope with stress positively.     Exercising on Your Own:  -Group instruction provided by verbal instruction, power point, and written materials to support subject.  Instructors discuss benefits of exercise, components of exercise, frequency and intensity of exercise, and end points for exercise.  Also discuss use of nitroglycerin and activating EMS.  Review options of places to exercise outside of rehab.  Review guidelines for sex with heart disease.   Cardiac Drugs I:  -Group instruction provided by verbal instruction and written materials to support subject.  Instructor reviews cardiac drug classes: antiplatelets, anticoagulants, beta blockers, and statins.  Instructor discusses reasons, side effects, and lifestyle considerations for each drug class.   Cardiac Drugs II:  -Group instruction provided by verbal instruction and written materials to support subject.  Instructor reviews cardiac drug classes: angiotensin converting enzyme inhibitors (ACE-I), angiotensin II receptor blockers (ARBs), nitrates, and calcium channel blockers.  Instructor discusses reasons, side effects, and lifestyle considerations for each drug class.   Anatomy and Physiology of the Circulatory System:  -Group instruction provided by verbal instruction, video, and written materials to support subject.  Reviews functional anatomy of heart, how it relates to various diagnoses, and what role the heart plays in the overall system.   Knowledge Questionnaire Score:     Knowledge Questionnaire Score - 12/16/16 1127      Knowledge Questionnaire Score   Pre Score 19/24      Core Components/Risk Factors/Patient Goals at Admission:     Personal Goals and Risk Factors at Admission - 02/02/17 1611      Core Components/Risk Factors/Patient Goals on Admission    Weight Management Yes;Obesity;Weight Loss   Intervention Weight Management: Develop a  combined nutrition and exercise program designed to reach desired caloric intake, while  maintaining appropriate intake of nutrient and fiber, sodium and fats, and appropriate energy expenditure required for the weight goal.;Weight Management: Provide education and appropriate resources to help participant work on and attain dietary goals.;Weight Management/Obesity: Establish reasonable short term and long term weight goals.;Obesity: Provide education and appropriate resources to help participant work on and attain dietary goals.   Expected Outcomes Short Term: Continue to assess and modify interventions until short term weight is achieved;Long Term: Adherence to nutrition and physical activity/exercise program aimed toward attainment of established weight goal;Weight Maintenance: Understanding of the daily nutrition guidelines, which includes 25-35% calories from fat, 7% or less cal from saturated fats, less than 293m cholesterol, less than 1.5gm of sodium, & 5 or more servings of fruits and vegetables daily;Weight Loss: Understanding of general recommendations for a balanced deficit meal plan, which promotes 1-2 lb weight loss per week and includes a negative energy balance of [phone] kcal/d;Understanding recommendations for meals to include 15-35% energy as protein, 25-35% energy from fat, 35-60% energy from carbohydrates, less than 2098mof dietary cholesterol, 20-35 gm of total fiber daily;Understanding of distribution of calorie intake throughout the day with the consumption of 4-5 meals/snacks   Sedentary Yes   Intervention Provide advice, education, support and counseling about physical activity/exercise needs.;Develop an individualized exercise prescription for aerobic and resistive training based on initial evaluation findings, risk stratification, comorbidities and participant's personal goals.   Expected Outcomes Achievement of increased cardiorespiratory fitness and enhanced flexibility, muscular  endurance and strength shown through measurements of functional capacity and personal statement of participant.   Increase Strength and Stamina Yes   Intervention Provide advice, education, support and counseling about physical activity/exercise needs.;Develop an individualized exercise prescription for aerobic and resistive training based on initial evaluation findings, risk stratification, comorbidities and participant's personal goals.   Improve shortness of breath with ADL's Yes   Intervention Provide education, individualized exercise plan and daily activity instruction to help decrease symptoms of SOB with activities of daily living.   Expected Outcomes Short Term: Achieves a reduction of symptoms when performing activities of daily living.   Diabetes Yes   Intervention Provide education about signs/symptoms and action to take for hypo/hyperglycemia.;Provide education about proper nutrition, including hydration, and aerobic/resistive exercise prescription along with prescribed medications to achieve blood glucose in normal ranges: Fasting glucose 65-99 mg/dL   Expected Outcomes Short Term: Participant verbalizes understanding of the signs/symptoms and immediate care of hyper/hypoglycemia, proper foot care and importance of medication, aerobic/resistive exercise and nutrition plan for blood glucose control.;Long Term: Attainment of HbA1C < 7%.   Hypertension Yes   Intervention Provide education on lifestyle modifcations including regular physical activity/exercise, weight management, moderate sodium restriction and increased consumption of fresh fruit, vegetables, and low fat dairy, alcohol moderation, and smoking cessation.;Monitor prescription use compliance.   Expected Outcomes Short Term: Continued assessment and intervention until BP is < 140/9055mG in hypertensive participants. < 130/79m63m in hypertensive participants with diabetes, heart failure or chronic kidney disease.;Long Term:  Maintenance of blood pressure at goal levels.   Lipids Yes   Intervention Provide education and support for participant on nutrition & aerobic/resistive exercise along with prescribed medications to achieve LDL <70mg60mL >40mg.40mxpected Outcomes Long Term: Cholesterol controlled with medications as prescribed, with individualized exercise RX and with personalized nutrition plan. Value goals: LDL < 70mg, 71m> 40 mg.   Stress Yes   Intervention Offer individual and/or small group education and counseling on adjustment to heart disease, stress management  and health-related lifestyle change. Teach and support self-help strategies.;Refer participants experiencing significant psychosocial distress to appropriate mental health specialists for further evaluation and treatment. When possible, include family members and significant others in education/counseling sessions.   Expected Outcomes Short Term: Participant demonstrates changes in health-related behavior, relaxation and other stress management skills, ability to obtain effective social support, and compliance with psychotropic medications if prescribed.;Long Term: Emotional wellbeing is indicated by absence of clinically significant psychosocial distress or social isolation.   Personal Goal Other Yes   Personal Goal Better limitations and understanding of activity levels/exercise. Develop an exercise routine. Better control and management of diabetes   Intervention Provide nutrition counseling on diabetes and heart healthy diet. Provide exercise programming to assist with improving overall fitness level.   Expected Outcomes Pt will have better understanding and management of diabetes. Pt will also have an established exercise routine and feel comfortable with activity/exertion.      Core Components/Risk Factors/Patient Goals Review:    Core Components/Risk Factors/Patient Goals at Discharge (Final Review):    ITP Comments:     ITP Comments     Row Name 12/10/16 [phone] 12/18/16 0826 02/02/17 0848       ITP Comments Dr. Fransico Him, Medical Director Attended CPR education class, Met outcomes/goals  Dr. Fransico Him, Medical Director         Comments: Dorathy attended orientation from 0800 to 0930 to review rules and guidelines for program. Completed 6 minute walk test, Intitial ITP, and exercise prescription.  VSS. Telemetry-Sinus Rhythm. Cruz complained of having chronic back pain towards the end of her walk test and used a rollator to helpm with her posture.Barnet Pall, RN,BSN 02/02/2017 4:16 PM

## 2017-02-03 ENCOUNTER — Encounter (HOSPITAL_COMMUNITY): Payer: Medicaid Other

## 2017-02-04 ENCOUNTER — Encounter (HOSPITAL_COMMUNITY): Payer: Self-pay

## 2017-02-04 DIAGNOSIS — Y92000 Kitchen of unspecified non-institutional (private) residence as  the place of occurrence of the external cause: Secondary | ICD-10-CM | POA: Diagnosis not present

## 2017-02-04 DIAGNOSIS — Z7982 Long term (current) use of aspirin: Secondary | ICD-10-CM | POA: Diagnosis not present

## 2017-02-04 DIAGNOSIS — Y9389 Activity, other specified: Secondary | ICD-10-CM | POA: Diagnosis not present

## 2017-02-04 DIAGNOSIS — I2510 Atherosclerotic heart disease of native coronary artery without angina pectoris: Secondary | ICD-10-CM | POA: Insufficient documentation

## 2017-02-04 DIAGNOSIS — X118XXA Contact with other hot tap-water, initial encounter: Secondary | ICD-10-CM | POA: Diagnosis not present

## 2017-02-04 DIAGNOSIS — I252 Old myocardial infarction: Secondary | ICD-10-CM | POA: Insufficient documentation

## 2017-02-04 DIAGNOSIS — Z955 Presence of coronary angioplasty implant and graft: Secondary | ICD-10-CM | POA: Diagnosis not present

## 2017-02-04 DIAGNOSIS — Z794 Long term (current) use of insulin: Secondary | ICD-10-CM | POA: Insufficient documentation

## 2017-02-04 DIAGNOSIS — Z79899 Other long term (current) drug therapy: Secondary | ICD-10-CM | POA: Insufficient documentation

## 2017-02-04 DIAGNOSIS — Y999 Unspecified external cause status: Secondary | ICD-10-CM | POA: Insufficient documentation

## 2017-02-04 DIAGNOSIS — E1140 Type 2 diabetes mellitus with diabetic neuropathy, unspecified: Secondary | ICD-10-CM | POA: Insufficient documentation

## 2017-02-04 DIAGNOSIS — T24232A Burn of second degree of left lower leg, initial encounter: Secondary | ICD-10-CM | POA: Insufficient documentation

## 2017-02-04 DIAGNOSIS — J45909 Unspecified asthma, uncomplicated: Secondary | ICD-10-CM | POA: Diagnosis not present
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
## 2017-02-05 ENCOUNTER — Encounter (HOSPITAL_COMMUNITY): Payer: Medicaid Other

## 2017-02-05 ENCOUNTER — Emergency Department (HOSPITAL_COMMUNITY)
Admission: EM | Admit: 2017-02-05 | Discharge: 2017-02-05 | Disposition: A | Discharge status: Home / Self Care | Payer: Medicaid Other | Attending: Emergency Medicine | Admitting: Emergency Medicine

## 2017-02-05 DIAGNOSIS — T24202A Burn of second degree of unspecified site of left lower limb, except ankle and foot, initial encounter: Secondary | ICD-10-CM

## 2017-02-05 MED ORDER — IBUPROFEN 800 MG PO TABS
800.0000 mg | ORAL_TABLET | Freq: Once | ORAL | Status: AC
  Administered 2017-02-05: 800 mg via ORAL
  Filled 2017-02-05: qty 1

## 2017-02-05 MED ORDER — SILVER SULFADIAZINE 1 % EX CREA
TOPICAL_CREAM | Freq: Every day | CUTANEOUS | Status: DC
  Administered 2017-02-05: 05:00:00 via TOPICAL
  Filled 2017-02-05: qty 85

## 2017-02-05 MED ORDER — HYDROMORPHONE HCL 2 MG/ML IJ SOLN
1.0000 mg | Freq: Once | INTRAMUSCULAR | Status: AC
  Administered 2017-02-05: 1 mg via INTRAMUSCULAR
  Filled 2017-02-05: qty 1

## 2017-02-05 MED ORDER — IBUPROFEN 600 MG PO TABS: 600.0000 mg | ORAL_TABLET | Freq: Three times a day (TID) | ORAL | 0 refills | Status: DC | PRN
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
## 2017-02-08 ENCOUNTER — Telehealth (HOSPITAL_COMMUNITY): Payer: Self-pay | Admitting: Cardiac Rehabilitation

## 2017-02-08 ENCOUNTER — Encounter (HOSPITAL_COMMUNITY): Payer: Medicaid Other

## 2017-02-08 ENCOUNTER — Encounter (HOSPITAL_COMMUNITY): Admission: RE | Admit: 2017-02-08 | Payer: Medicaid Other | Source: Ambulatory Visit

## 2017-02-08 ENCOUNTER — Telehealth: Payer: Self-pay | Admitting: Nutrition

## 2017-02-08 NOTE — Telephone Encounter (Signed)
Pt thought per the conversation she had with Bonita Quin that she was supposed to be receiving 2-3 vials of insulin but when she went to pick up the rx they only gave her one. Please advise
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
## 2017-02-09 NOTE — Progress Notes (Signed)
Pt discharged from cardiac rehab program due to hyperglycemia.  Pt instructed unable to participate at this time and   Pt will be rescheduled to restart program once blood sugars normalize.  Pt verbalized understanding.

## 2017-02-09 NOTE — Telephone Encounter (Signed)
Message left on her machine to call me.  Am uncertain as to which insulin she is referring to.

## 2017-02-10 ENCOUNTER — Encounter (HOSPITAL_COMMUNITY): Payer: Medicaid Other

## 2017-02-10 ENCOUNTER — Encounter (HOSPITAL_COMMUNITY): Admission: RE | Admit: 2017-02-10 | Payer: Medicaid Other | Source: Ambulatory Visit

## 2017-02-11 ENCOUNTER — Encounter: Payer: Self-pay | Admitting: Dietician

## 2017-02-11 ENCOUNTER — Encounter: Payer: Medicaid Other | Admitting: Dietician

## 2017-02-11 ENCOUNTER — Encounter: Payer: Self-pay | Admitting: Physician Assistant

## 2017-02-11 ENCOUNTER — Telehealth: Payer: Self-pay | Admitting: Dietician

## 2017-02-11 ENCOUNTER — Ambulatory Visit: Payer: Medicaid Other | Attending: Physician Assistant | Admitting: Physician Assistant

## 2017-02-11 VITALS — BP 164/101 | HR 94 | Temp 98.8°F | Resp 16 | Ht 67.5 in | Wt >= 6400 oz

## 2017-02-11 DIAGNOSIS — Z794 Long term (current) use of insulin: Secondary | ICD-10-CM | POA: Insufficient documentation

## 2017-02-11 DIAGNOSIS — Z6841 Body Mass Index (BMI) 40.0 and over, adult: Secondary | ICD-10-CM | POA: Diagnosis not present

## 2017-02-11 DIAGNOSIS — Z79899 Other long term (current) drug therapy: Secondary | ICD-10-CM | POA: Diagnosis not present

## 2017-02-11 DIAGNOSIS — X088XXD Exposure to other specified smoke, fire and flames, subsequent encounter: Secondary | ICD-10-CM | POA: Diagnosis not present

## 2017-02-11 DIAGNOSIS — R601 Generalized edema: Secondary | ICD-10-CM | POA: Insufficient documentation

## 2017-02-11 DIAGNOSIS — T24202D Burn of second degree of unspecified site of left lower limb, except ankle and foot, subsequent encounter: Secondary | ICD-10-CM | POA: Insufficient documentation

## 2017-02-11 DIAGNOSIS — T24232A Burn of second degree of left lower leg, initial encounter: Secondary | ICD-10-CM | POA: Diagnosis not present

## 2017-02-11 DIAGNOSIS — Z7982 Long term (current) use of aspirin: Secondary | ICD-10-CM | POA: Diagnosis not present

## 2017-02-11 DIAGNOSIS — I10 Essential (primary) hypertension: Secondary | ICD-10-CM | POA: Diagnosis not present

## 2017-02-11 DIAGNOSIS — L03116 Cellulitis of left lower limb: Secondary | ICD-10-CM | POA: Diagnosis not present

## 2017-02-11 DIAGNOSIS — E119 Type 2 diabetes mellitus without complications: Secondary | ICD-10-CM | POA: Insufficient documentation

## 2017-02-11 DIAGNOSIS — E118 Type 2 diabetes mellitus with unspecified complications: Secondary | ICD-10-CM

## 2017-02-11 DIAGNOSIS — Z713 Dietary counseling and surveillance: Secondary | ICD-10-CM | POA: Diagnosis not present

## 2017-02-11 MED ORDER — NAPROXEN 500 MG PO TABS: 500.0000 mg | ORAL_TABLET | Freq: Two times a day (BID) | ORAL | 0 refills | Status: AC

## 2017-02-11 MED ORDER — SILVER SULFADIAZINE 1 % EX CREA: 1.0000 "application " | TOPICAL_CREAM | Freq: Every day | CUTANEOUS | 0 refills | Status: DC

## 2017-02-11 MED ORDER — TRAMADOL HCL 50 MG PO TABS
50.0000 mg | ORAL_TABLET | Freq: Two times a day (BID) | ORAL | 0 refills | Status: AC | PRN
Start: 2017-02-11 — End: 2017-02-16

## 2017-02-11 MED ORDER — CEPHALEXIN 500 MG PO TABS: 500.0000 mg | ORAL_TABLET | Freq: Two times a day (BID) | ORAL | 0 refills | Status: AC
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
## 2017-02-11 NOTE — Patient Instructions (Signed)
Have breakfast every day.  Be sure to take your meal time insulin. Rethink what you drink.  Water is great! Your choices begin in the grocery store.  Avoid shopping when hungry. Choose mindfully, eat slowly, stop when you are satisfied. Bake, boil or grill more than fry.  Plan:  Aim for 2-3 Carb Choices per meal (30-45 grams) +/- 1 either way  Aim for 0-1 Carbs per snack if hungry  Include protein in moderation with your meals and snacks Consider reading food labels for Total Carbohydrate and Fat Grams of foods Consider  increasing your activity level by walking or armchair exercises for 5-10 minutes minutes 2-3 times per day as tolerated Continue checking BG at alternate times per day as directed by MD  Continue taking medication as directed by MD

## 2017-02-11 NOTE — Telephone Encounter (Signed)
Called patient to repeat that she should take 60 units of Lantus q HS.  She had been taking 47 units bid.  Discussed to speak with Bonita Quin or Dr. Lucianne Muss if questions.  Oran Rein, RD, LDN

## 2017-02-11 NOTE — Patient Instructions (Signed)
Please call if signs of infection persist or worsen. Signs include but are not limited to increasing/spreading redness, swelling, increased warmth to the affected area, increased pain, reduced mobility if infection is around a joint, hardness to the skin, pustular drainage, fever, chills, nausea, vomiting, malaise, or any other worrisome symptom.  How to Change Your Dressing A dressing is a material that is placed in and over wounds. A dressing helps your wound to heal by protecting it from bacteria, further injury, and becoming too dry or too wet. What are the risks? The adhesive tape that is used with a dressing may make your skin sore or irritated or cause a rash. These are the most common problems. However, more serious problems can develop, such as:  Bleeding.  Infection. How to change your dressing How often you change your dressing will depend on your wound. Change the dressing as often as told by your health care provider. Preparing to Change Your Dressing  Take a shower before you do the first dressing change of the day. If your health care provider does not want your wound to get wet and your dressing is not waterproof, you may need to apply plastic leak-proof sealing wrap to your dressing for protection.  If needed, take pain medicine 30 minutes before the dressing change as prescribed by your health care provider.  Set up a clean station for wound care. You will need:  A disposable garbage bag that is open and ready to use.  Hand sanitizer.  Wound cleanser or salt-water solution (saline) as told by your health care provider.  New dressing material or bandages. Make sure to open the dressing package so the dressing remains on the inside of the package. You may also need the following in your clean station:  A box of vinyl gloves.  Tape.  Skin protectant. This may be a wipe, film, or spray.  Clean or germ-free (sterile) scissors.  A cotton-tipped applicator. Removing  Your Old Dressing  Wash your hands with soap and water. Dry your hands with a clean towel. If soap and water are not available, use hand sanitizer.  If you are using gloves, put the gloves on before you remove the dressing.  Gently remove any adhesive or tape by pulling it off in the direction of your hair growth. Only touch the outside edges of the dressing.  Take off the dressing. If the dressing sticks to your skin, use a sterile salt-water solution to wet the dressing. This helps it to come off more easily.  Remove any gauze or packing in your wound.  Throw the old dressing supplies into the ready garbage bag.  Remove each glove by grabbing the cuff with the opposite hand and turning the glove inside out. Place the gloves in the trash immediately.  Wash your hands with soap and water. Dry your hands with a clean towel. If soap and water are not available, use hand sanitizer. Cleaning Your Wound  Follow instructions from your health care provider about how to clean your wound. This may include using a saline or recommended wound cleanser.  Do not use over-the-counter medicated or antiseptic creams, sprays, liquids, or dressings unless told to do so by your health care provider.  Use a clean gauze pad to clean the area thoroughly with the recommended saline solution or wound cleanser.  Throw the gauze pad into the garbage bag.  Wash your hands with soap and water. Dry your hands with a clean towel. If soap and  water are not available, use hand sanitizer. Applying the Dressing  If your health care provider recommended a skin protectant, apply it to the skin around the wound.  Cover the wound with the recommended dressing, such as a nonstick gauze or bandage. Make sure to touch only the outside edges of the dressing. Do not touch the inside of the dressing.  Secure the dressing so all sides stay in place. You may do this with the attached medical adhesive, roll gauze, or tape. If  you use tape, do not wrap the tape all the way around your arm or leg.  Take off your gloves. Put them in the plastic bag with the old dressing. Tie the bag shut and throw it away.  Wash your hands with soap and water. Dry your hands with a clean towel. If soap and water are not available, use hand sanitizer. Contact a health care provider if:   You have new pain.  You develop irritation, a rash, or itching around the wound or dressing.  Changing your dressing causes pain or a lot of bleeding. Get help right away if:  You have severe pain.  You have signs of infection, such as:  More redness, swelling, or pain.  More fluid or blood.  Warmth.  Pus or a bad smell.  Red streaks leading from wound.  A fever. This information is not intended to replace advice given to you by your health care provider. Make sure you discuss any questions you have with your health care provider. Document Released: 01/14/2005 Document Revised: 05/06/2016 Document Reviewed: 09/12/2015 Elsevier Interactive Patient Education  2017 [employer].

## 2017-02-12 ENCOUNTER — Encounter (HOSPITAL_COMMUNITY): Payer: Medicaid Other

## 2017-02-14 LAB — WOUND CULTURE
Gram Stain: NONE SEEN
Gram Stain: NONE SEEN
Gram Stain: NONE SEEN
Organism ID, Bacteria: NO GROWTH

## 2017-02-15 ENCOUNTER — Encounter (HOSPITAL_COMMUNITY): Payer: Medicaid Other

## 2017-02-16 ENCOUNTER — Telehealth: Payer: Self-pay | Admitting: Internal Medicine

## 2017-02-16 NOTE — Telephone Encounter (Signed)
There is no note in the chart and I didn't try to call pt
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
## 2017-02-17 ENCOUNTER — Encounter (HOSPITAL_COMMUNITY): Payer: Self-pay | Admitting: *Deleted

## 2017-02-17 ENCOUNTER — Encounter (HOSPITAL_COMMUNITY): Payer: Medicaid Other
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
## 2017-02-18 ENCOUNTER — Encounter: Payer: Self-pay | Admitting: Endocrinology

## 2017-02-18 ENCOUNTER — Ambulatory Visit (INDEPENDENT_AMBULATORY_CARE_PROVIDER_SITE_OTHER): Payer: Medicaid Other | Admitting: Endocrinology

## 2017-02-18 ENCOUNTER — Other Ambulatory Visit: Payer: Self-pay

## 2017-02-18 VITALS — BP 142/90 | HR 75 | Ht 67.5 in | Wt 397.0 lb

## 2017-02-18 DIAGNOSIS — Z794 Long term (current) use of insulin: Secondary | ICD-10-CM

## 2017-02-18 DIAGNOSIS — E1165 Type 2 diabetes mellitus with hyperglycemia: Secondary | ICD-10-CM | POA: Diagnosis not present

## 2017-02-18 LAB — BASIC METABOLIC PANEL
BUN: 15 mg/dL (ref 6–23)
CHLORIDE: 105 meq/L (ref 96–112)
CO2: 24 mEq/L (ref 19–32)
CREATININE: 0.9 mg/dL (ref 0.40–1.20)
Calcium: 9.8 mg/dL (ref 8.4–10.5)
GFR: 91.4 mL/min (ref >60.00)
GLUCOSE: 214 mg/dL — AB (ref 70–99)
POTASSIUM: 3.9 meq/L (ref 3.5–5.1)
Sodium: 137 mEq/L (ref 135–145)

## 2017-02-18 LAB — URINALYSIS, ROUTINE W REFLEX MICROSCOPIC
Bilirubin Urine: NEGATIVE
Ketones, ur: NEGATIVE
Leukocytes, UA: NEGATIVE
Nitrite: NEGATIVE
PH: 6 (ref 5.0–8.0)
UROBILINOGEN UA: 0.2 (ref 0.0–1.0)
Urine Glucose: >=1000 — AB

## 2017-02-18 LAB — MICROALBUMIN / CREATININE URINE RATIO
Creatinine,U: 57.2 mg/dL
MICROALB/CREAT RATIO: 16.8 mg/g (ref 0.0–30.0)
Microalb, Ur: 9.6 mg/dL — ABNORMAL HIGH (ref 0.0–1.9)

## 2017-02-18 MED ORDER — EXENATIDE 5 MCG/0.02ML ~~LOC~~ SOPN: 5.0000 ug | PEN_INJECTOR | Freq: Two times a day (BID) | SUBCUTANEOUS | 2 refills | Status: DC

## 2017-02-18 NOTE — Patient Instructions (Signed)
lantus 50 units   Byetta 5 ug daily before supper with U-500 insulin which will be 50 units

## 2017-02-18 NOTE — Progress Notes (Signed)
Patient ID: Lindsay [name], female   DOB: 1981/05/22, 36 y.o.   MRN: [id number]           Reason for Appointment: Type II Diabetes    History of Present Illness   Diagnosis date: 2004  Previous history:  On insulin since about 2008 No prior records are available  Recent history:  Insulin regimen: Lantus 60 hs, HUMULIN R U-500: 70 units before meals and 50 at bedtime  Her A1c is 12.4 recently, previously 13.1 and 12.4 in 2017  Current management, blood sugar patterns and problems identified:  She has not been followed regularly for her diabetes until recently and had persistently poor control  Because of marked hyperglycemia her NovoLog was changed to U-500 insulin which she has been able to get  She was told to start with 50 units and increase the Humulin R further if needed and she has gone up to 70 units before meals  Her blood sugars have been very significantly improved in the last 2 weeks or so  However she is checking her sugars somewhat erratically and usually not postprandially after evening meal  Her Lantus was reduced to once a day at night only  She is sometimes feeling shaky early morning when blood sugars may be as low as 76 but this has been inconsistent, she thinks she is not use to her sugars been normal However she thinks she is having difficulty controlling her portions and snacks in the evening and eating late night snacks like cookies  She has no side effects from starting JARDIANCE 10 mg daily and has lost weight, has not had any follow-up labs  Currently not doing much physical activity     Oral hypoglycemic drugs:        Side effects from medications: None        Proper timing of medications in relation to meals: Yes.          Monitors blood glucose: Once or twice a day.    Glucometer:  Accu-Chek  Blood Glucose readings from meter download for the last 4 weeks:   Mean values apply above for all meters except median for One Touch  PRE-MEAL  Fasting Lunch Afternoon  Overnight  Overall  Glucose range: 76-296   102-203  217-429    Mean/median:   188   212    POST-MEAL PC Breakfast PC Lunch PC Dinner  Glucose range:   136-331   Mean/median:         Hypoglycemia:  none         Meals: 3 meals per day. Drinks water, Avoiding drinks with sugar Does not have a specific meal plan          Physical activity: exercise: Minimal            Dietician visit: Most recent: never     Weight control:  Wt Readings from Last 3 Encounters:  02/18/17 (!) 397 lb (180.1 kg)  02/11/17 (!) 407 lb 12.8 oz (185 kg)  02/11/17 (!) 406 lb (184.2 kg)           Diabetes labs:  Lab Results  Component Value Date   HGBA1C 12.5 01/18/2017   HGBA1C 12.4 (H) 09/08/2016   HGBA1C 13.1 06/10/2016   Lab Results  Component Value Date   MICROALBUR 9.6 (H) 02/18/2017   LDLCALC 54 10/28/2016   CREATININE 0.90 02/18/2017     Allergies as of 02/18/2017      Reactions   Hydrazine  Yellow [tartrazine] Shortness Of Breath, Swelling   Swelling mostly noticed in legs and feet, retaining urination, shortness of breaht, and minor chest pain   Lisinopril Shortness Of Breath   Was on prinzide; had sob/chest pain on it.   Tylenol [acetaminophen] Itching, Swelling   Itching of the mouth, swelling of tongue and stomach started hurting      Medication List       Accurate as of 02/18/17  3:03 PM. Always use your most recent med list.          ACCU-CHEK AVIVA PLUS w/Device Kit Use as directed for 4 times daily blood glucose monitoring   accu-chek softclix lancets Use as instructed for 4 times daily blood glucose monitoring   ACCU-CHEK SOFTCLIX LANCETS lancets Use as instructed for 4 times daily blood glucose monitoring   albuterol 108 (90 Base) MCG/ACT inhaler Commonly known as:  PROVENTIL HFA;VENTOLIN HFA Inhale 2 puffs into the lungs every 6 (six) hours as needed for wheezing or shortness of breath.   amLODipine 5 MG tablet Commonly known as:   NORVASC Take 1 tablet (5 mg total) by mouth daily.   aspirin 81 MG tablet Take 81 mg by mouth daily.   atorvastatin 80 MG tablet Commonly known as:  LIPITOR Take 1 tablet (80 mg total) by mouth daily at 6 PM.   Cephalexin 500 MG tablet Take 1 tablet (500 mg total) by mouth 2 (two) times daily.   empagliflozin 10 MG Tabs tablet Commonly known as:  JARDIANCE Take 10 mg by mouth daily with breakfast.   gabapentin 300 MG capsule Commonly known as:  NEURONTIN Take 2 capsules (600 mg total) by mouth 4 (four) times daily.   glucose blood test strip Commonly known as:  ACCU-CHEK AVIVA PLUS Use as instructed for 4 times daily blood glucose monitoring   ibuprofen 600 MG tablet Commonly known as:  ADVIL,MOTRIN Take 1 tablet (600 mg total) by mouth every 8 (eight) hours as needed.   Insulin Glargine 100 UNIT/ML Solostar Pen Commonly known as:  LANTUS SOLOSTAR Inject 60 units at bedtime.   Insulin Pen Needle 31G X 5 MM Misc 1 application by Does not apply route Nightly.   insulin regular human CONCENTRATED 500 UNIT/ML injection Commonly known as:  HUMULIN R 70 units 30 minutes before each meal; 50 at bedtime.   Insulin Syringe-Needle U-100 31G X 15/64" 1 ML Misc Commonly known as:  BD INSULIN SYRINGE ULTRAFINE Use to inject insulin daily   losartan 25 MG tablet Commonly known as:  COZAAR Take 1 tablet (25 mg total) by mouth daily.   medroxyPROGESTERone 150 MG/ML injection Commonly known as:  DEPO-PROVERA Inject 150 mg into the muscle every 3 (three) months.   meloxicam 15 MG tablet Commonly known as:  MOBIC TAKE ONE TABLET BY MOUTH ONCE DAILY .MUST  HAVE  OFFICE  VISIT  FOR  REFILLS   methocarbamol 500 MG tablet Commonly known as:  ROBAXIN TAKE ONE TABLET BY MOUTH 4 TIMES DAILY   metoprolol 50 MG tablet Commonly known as:  LOPRESSOR Take 1 tablet (50 mg total) by mouth 2 (two) times daily.   naproxen 500 MG tablet Commonly known as:  NAPROSYN Take 1 tablet (500 mg  total) by mouth 2 (two) times daily with a meal.   nitroGLYCERIN 0.4 MG SL tablet Commonly known as:  NITROSTAT Place 1 tablet (0.4 mg total) under the tongue every 5 (five) minutes as needed for chest pain.   PRENATAL MULTIVITAMIN/IRON PO Take  1 tablet by mouth daily.   silver sulfADIAZINE 1 % cream Commonly known as:  SILVADENE Apply 1 application topically daily.   ticagrelor 90 MG Tabs tablet Commonly known as:  BRILINTA Take 1 tablet (90 mg total) by mouth 2 (two) times daily.       Allergies:  Allergies  Allergen Reactions  . Hydrazine Yellow [Tartrazine] Shortness Of Breath and Swelling    Swelling mostly noticed in legs and feet, retaining urination, shortness of breaht, and minor chest pain  . Lisinopril Shortness Of Breath    Was on prinzide; had sob/chest pain on it.  . Tylenol [Acetaminophen] Itching and Swelling    Itching of the mouth, swelling of tongue and stomach started hurting    Past Medical History:  Diagnosis Date  . ARF (acute renal failure) (HCC) 04/2015  . Asthma   . Cellulitis of right upper extremity   . Coronary artery disease   . Diabetes mellitus    insulin dependent  . Hyperlipidemia LDL goal <70   . Hypertension   . NSTEMI (non-ST elevated myocardial infarction) (HCC) 08/2016  . Obesity   . S/P angioplasty with stent 08/2016   DES to mLAD and PTCA only to 2nd diag ostium.     Past Surgical History:  Procedure Laterality Date  . CARDIAC CATHETERIZATION N/A 09/07/2016   Procedure: Left Heart Cath and Coronary Angiography;  Surgeon: Lyn Records, MD;  Location: [facility] INVASIVE CV LAB;  Service: Cardiovascular;  Laterality: N/A;  . CARDIAC CATHETERIZATION N/A 09/07/2016   Procedure: Coronary Stent Intervention;  Surgeon: Lyn Records, MD;  Location: [facility] INVASIVE CV LAB;  Service: Cardiovascular;  Laterality: N/A;  . CARDIAC CATHETERIZATION N/A 09/07/2016   Procedure: Coronary Balloon Angioplasty;  Surgeon: Lyn Records, MD;  Location: [facility]  INVASIVE CV LAB;  Service: Cardiovascular;  Laterality: N/A;  . CESAREAN SECTION    . IRRIGATION AND DEBRIDEMENT SHOULDER Right 04/30/2015   Procedure: IRRIGATION AND DEBRIDEMENT SHOULDER;  Surgeon: Sheral Apley, MD;  Location: [facility] OR;  Service: Orthopedics;  Laterality: Right;  . IRRIGATION AND DEBRIDEMENT SHOULDER Right 05/01/2015  . LEG SURGERY    . SHOULDER ARTHROSCOPY Right 04/30/2015   Procedure: ARTHROSCOPY SHOULDER;  Surgeon: Sheral Apley, MD;  Location: [facility] OR;  Service: Orthopedics;  Laterality: Right;  . TONSILLECTOMY      Family History  Problem Relation Age of Onset  . Diabetes Mother   . Hypertension Mother   . Heart disease Mother   . Diabetes Father   . Heart disease Father   . Stroke Maternal Grandmother   . Cancer Maternal Grandmother     Social History:  reports that she has never smoked. She has never used smokeless tobacco. She reports that she drinks alcohol. She reports that she does not use drugs.  Review of Systems:  Hypertension:  She is on  Lopressor, out of amlodipine for 3 days  BP Readings from Last 3 Encounters:  02/18/17 (!) 142/90  02/11/17 (!) 164/101  02/05/17 120/76     Lipids: Has been treated with Lipitor 80 mg because of history of CAD  Lab Results  Component Value Date   CHOL 124 10/28/2016   HDL 35 (L) 10/28/2016   LDLCALC 54 10/28/2016   TRIG 174 (H) 10/28/2016   CHOLHDL 3.5 10/28/2016      LABS:  Office Visit on 02/18/2017  Component Date Value Ref Range Status  . Microalb, Ur 02/18/2017 9.6* 0.0 - 1.9 mg/dL Final  .  Creatinine,U 02/18/2017 57.2  mg/dL Final  . Microalb Creat Ratio 02/18/2017 16.8  0.0 - 30.0 mg/g Final  . Color, Urine 02/18/2017 YELLOW  Yellow;Lt. Yellow Final  . APPearance 02/18/2017 CLEAR  Clear Final  . Specific Gravity, Urine 02/18/2017 <=1.005* 1.000 - 1.030 Final  . pH 02/18/2017 6.0  5.0 - 8.0 Final  . Total Protein, Urine 02/18/2017 TRACE* Negative Final  . Urine Glucose 02/18/2017  >=1000* Negative Final  . Ketones, ur 02/18/2017 NEGATIVE  Negative Final  . Bilirubin Urine 02/18/2017 NEGATIVE  Negative Final  . Hgb urine dipstick 02/18/2017 MODERATE* Negative Final  . Urobilinogen, UA 02/18/2017 0.2  0.0 - 1.0 Final  . Leukocytes, UA 02/18/2017 NEGATIVE  Negative Final  . Nitrite 02/18/2017 NEGATIVE  Negative Final  . RBC / HPF 02/18/2017 3-6/hpf* 0-2/hpf Final  . Squamous Epithelial / LPF 02/18/2017 Few(5-10/hpf)* Rare(0-4/hpf) Final  . Sodium 02/18/2017 137  135 - 145 mEq/L Final  . Potassium 02/18/2017 3.9  3.5 - 5.1 mEq/L Final  . Chloride 02/18/2017 105  96 - 112 mEq/L Final  . CO2 02/18/2017 24  19 - 32 mEq/L Final  . Glucose, Bld 02/18/2017 214* 70 - 99 mg/dL Final  . BUN 40/98/1191 15  6 - 23 mg/dL Final  . Creatinine, Ser 02/18/2017 0.90  0.40 - 1.20 mg/dL Final  . Calcium 47/82/9562 9.8  8.4 - 10.5 mg/dL Final  . GFR 13/07/6577 91.40  >60.00 mL/min Final     Examination:   BP (!) 142/90   Pulse 75   Ht 5' 7.5" (1.715 m)   Wt (!) 397 lb (180.1 kg)   SpO2 97%   BMI 61.26 kg/m   Body mass index is 61.26 kg/m.   No ankle edema present  ASSESSMENT/ PLAN:    Diabetes type 2:   See history of present illness for detailed discussion of current diabetes management, blood sugar patterns and problems identified  Blood glucose control has been Significantly improved now with increasing her total insulin dose Also benefiting from using U-500 insulin instead of NovoLog Her weight has improved despite increased insulin doses and better blood sugars with the help of Jardiance which she is tolerating now However she thinks she is having difficulty controlling her portions and snacks in the evening and eating late night snacks like cookies Blood sugars are significantly better than before with sporadic high readings at different times but has not done readings after evening meal or at night recently   RECOMMENDATIONS:  Follow diet recommended by  dietitian.  To start Byetta 5 g before supper but discussed how GLP-1 drugs work and possible nausea, timing of injection and how to dial the dose.  With starting Byetta she will reduce her suppertime Humulin R by 20 units  Reduce Lantus by 10 units   Also will consider using the V-go pump, most likely she will benefit from using the U-500 insulin in this with better control and convenience is also reduced insulin requirement  She will stay with 10 mg Jardiance once a day.  Will need  chemistry panel today   More consistent glucose monitoring including after supper  Improve diet  Increase activity level    Patient Instructions  lantus 50 units   Byetta 5 ug daily before supper with U-500 insulin which will be 50 units  Counseling time on subjects discussed above is over 50% of today's 25 minute visit   Azad Calame 02/18/2017, 3:03 PM

## 2017-02-19 ENCOUNTER — Ambulatory Visit (INDEPENDENT_AMBULATORY_CARE_PROVIDER_SITE_OTHER): Payer: Medicaid Other | Admitting: Physician Assistant

## 2017-02-19 ENCOUNTER — Encounter (INDEPENDENT_AMBULATORY_CARE_PROVIDER_SITE_OTHER): Payer: Self-pay | Admitting: Physician Assistant

## 2017-02-19 ENCOUNTER — Encounter (HOSPITAL_COMMUNITY): Payer: Medicaid Other

## 2017-02-19 VITALS — BP 160/92 | HR 100 | Temp 97.8°F | Ht 67.5 in | Wt 399.0 lb

## 2017-02-19 DIAGNOSIS — R0982 Postnasal drip: Secondary | ICD-10-CM | POA: Diagnosis not present

## 2017-02-19 DIAGNOSIS — I10 Essential (primary) hypertension: Secondary | ICD-10-CM | POA: Diagnosis not present

## 2017-02-19 DIAGNOSIS — R0981 Nasal congestion: Secondary | ICD-10-CM

## 2017-02-19 DIAGNOSIS — R05 Cough: Secondary | ICD-10-CM

## 2017-02-19 DIAGNOSIS — R059 Cough, unspecified: Secondary | ICD-10-CM

## 2017-02-19 MED ORDER — OXYMETAZOLINE HCL 0.05 % NA SOLN: 1.0000 | Freq: Two times a day (BID) | NASAL | 0 refills | Status: AC

## 2017-02-19 MED ORDER — SALINE SPRAY 0.65 % NA SOLN: 1.0000 | NASAL | 0 refills | Status: DC | PRN

## 2017-02-19 MED ORDER — GUAIFENESIN ER 1200 MG PO TB12: 1.0000 | ORAL_TABLET | Freq: Two times a day (BID) | ORAL | 0 refills | Status: AC

## 2017-02-19 NOTE — Progress Notes (Addendum)
Subjective:  Patient ID: Lindsay [name], female    DOB: June 06, 1981  Age: 36 y.o. MRN: [id number]  CC:  Cough, SOB   HPI Lindsay [name] is a 36 y.o. female with a PMH of Diabetes, hypertension, hyperlipidemia, asthma, CAD, and morbid obesity presents to clinic with a cough and sinus congestion for more than one week. Has mild associated shortness of breath. Cough is mild and occasional throughout the day has some sinus pressure around the maxillary sinus region bilaterally. Has not taken anything for relief. Does not endorse having been with cold/flulike symptoms. Has a long-standing history of allergies. She is found to have uncontrolled hypertension in clinic today. Says that she has not taken her medications for the last 2 days. Reports having picked up amlodipine from the pharmacy yesterday evening. Denies chest pain, palpitations, headache, abdominal pain, fever, chills, nausea, vomiting, rash, or GI/GU symptoms   Outpatient Medications Prior to Visit  Medication Sig Dispense Refill  . ACCU-CHEK SOFTCLIX LANCETS lancets Use as instructed for 4 times daily blood glucose monitoring 100 each 12  . albuterol (PROVENTIL HFA;VENTOLIN HFA) 108 (90 Base) MCG/ACT inhaler Inhale 2 puffs into the lungs every 6 (six) hours as needed for wheezing or shortness of breath. 1 Inhaler 2  . amLODipine (NORVASC) 5 MG tablet Take 1 tablet (5 mg total) by mouth daily. 90 tablet 3  . aspirin 81 MG tablet Take 81 mg by mouth daily.    Marland Kitchen atorvastatin (LIPITOR) 80 MG tablet Take 1 tablet (80 mg total) by mouth daily at 6 PM. 30 tablet 6  . Blood Glucose Monitoring Suppl (ACCU-CHEK AVIVA PLUS) w/Device KIT Use as directed for 4 times daily blood glucose monitoring 1 kit 0  . Cephalexin 500 MG tablet Take 1 tablet (500 mg total) by mouth 2 (two) times daily. 20 tablet 0  . empagliflozin (JARDIANCE) 10 MG TABS tablet Take 10 mg by mouth daily with breakfast. 30 tablet 3  . exenatide (BYETTA 5 MCG PEN) 5  MCG/0.02ML SOPN injection Inject 0.02 mLs (5 mcg total) into the skin 2 (two) times daily before a meal. 1 pen 2  . gabapentin (NEURONTIN) 300 MG capsule Take 2 capsules (600 mg total) by mouth 4 (four) times daily. 240 capsule 3  . glucose blood (ACCU-CHEK AVIVA PLUS) test strip Use as instructed for 4 times daily blood glucose monitoring 100 each 12  . ibuprofen (ADVIL,MOTRIN) 600 MG tablet Take 1 tablet (600 mg total) by mouth every 8 (eight) hours as needed. 30 tablet 0  . Insulin Glargine (LANTUS SOLOSTAR) 100 UNIT/ML Solostar Pen Inject 60 units at bedtime. 10 pen 1  . Insulin Pen Needle 31G X 5 MM MISC 1 application by Does not apply route Nightly. 1000 each 3  . insulin regular human CONCENTRATED (HUMULIN R) 500 UNIT/ML injection 70 units 30 minutes before each meal; 50 at bedtime. 80 mL 2  . Insulin Syringe-Needle U-100 (BD INSULIN SYRINGE ULTRAFINE) 31G X 15/64" 1 ML MISC Use to inject insulin daily 100 each 1  . Lancet Devices (ACCU-CHEK SOFTCLIX) lancets Use as instructed for 4 times daily blood glucose monitoring 1 each 0  . losartan (COZAAR) 25 MG tablet Take 1 tablet (25 mg total) by mouth daily. 30 tablet 6  . medroxyPROGESTERone (DEPO-PROVERA) 150 MG/ML injection Inject 150 mg into the muscle every 3 (three) months.    . meloxicam (MOBIC) 15 MG tablet TAKE ONE TABLET BY MOUTH ONCE DAILY .MUST  HAVE  OFFICE  VISIT  FOR  REFILLS 30 tablet 0  . methocarbamol (ROBAXIN) 500 MG tablet TAKE ONE TABLET BY MOUTH 4 TIMES DAILY 120 tablet 2  . metoprolol tartrate (LOPRESSOR) 50 MG tablet Take 1 tablet (50 mg total) by mouth 2 (two) times daily. 120 tablet 3  . nitroGLYCERIN (NITROSTAT) 0.4 MG SL tablet Place 1 tablet (0.4 mg total) under the tongue every 5 (five) minutes as needed for chest pain. 25 tablet 12  . Prenatal Vit-Fe Fumarate-FA (PRENATAL MULTIVITAMIN/IRON PO) Take 1 tablet by mouth daily.    . silver sulfADIAZINE (SILVADENE) 1 % cream Apply 1 application topically daily. 400 g 0  .  ticagrelor (BRILINTA) 90 MG TABS tablet Take 1 tablet (90 mg total) by mouth 2 (two) times daily. 60 tablet 11   No facility-administered medications prior to visit.      ROS Review of Systems  Constitutional: Negative for chills, fever and malaise/fatigue.  HENT: Positive for congestion. Negative for sinus pain and sore throat.   Eyes: Negative for blurred vision.  Respiratory: Positive for cough and shortness of breath. Negative for wheezing.   Cardiovascular: Negative for chest pain and palpitations.  Gastrointestinal: Negative for abdominal pain, nausea and vomiting.  Genitourinary: Negative for dysuria and hematuria.  Musculoskeletal: Negative for joint pain and myalgias.  Skin: Negative for rash.  Neurological: Negative for tingling and headaches.  Psychiatric/Behavioral: Negative for depression. The patient is not nervous/anxious.     Objective:  BP (!) 160/92 (BP Location: Right Arm, Patient Position: Sitting, Cuff Size: Large)   Pulse 100   Temp 97.8 F (36.6 C) (Oral)   Ht 5' 7.5" (1.715 m)   Wt (!) 399 lb (181 kg)   SpO2 97%   BMI 61.57 kg/m   BP/Weight 02/19/2017 02/18/2017 4/65/6812  Systolic BP 751 700 174  Diastolic BP 92 90 944  Wt. (Lbs) 399 397 407.8  BMI 61.57 61.26 62.93      Physical Exam  Constitutional: She is oriented to person, place, and time.  NAD, obese, polite  HENT:  Head: Normocephalic and atraumatic.  Turbinates moderate to severely hypertrophic with thick white mucus. Postnasal drip. No tonsillar exudates. Mild discomfort felt with palpation of the maxillary sinuses.  Eyes: Conjunctivae are normal. No scleral icterus.  Neck: Normal range of motion. Thyromegaly: difficult to assess due to body habitus.  Difficult to assess for thyromegaly or nodules due to body habitus  Cardiovascular: Normal rate, regular rhythm and normal heart sounds.   Lower extremity distal pulses difficult to assess due to body habitus.  Pulmonary/Chest: Effort  normal. No respiratory distress. She has no wheezes. She has no rales.  Negative rhonchi  Abdominal: Soft. Bowel sounds are normal. She exhibits no distension. There is no tenderness.  Difficult to assess for abdominal mass due to body habitus   Musculoskeletal: She exhibits edema (Bilateral nonpitting edema of the lower extremity).  Bilateral non-pitting edema of the lower extremity  Lymphadenopathy:    She has no cervical adenopathy.  Neurological: She is alert and oriented to person, place, and time.  Skin: Skin is warm and dry. No rash noted.  Psychiatric: She has a normal mood and affect.  Vitals reviewed.    Assessment & Plan:   1. Cough - Not felt to be pulmonary in nature. Likely cause by her postnasal drip. No constitutional symptoms  2. Sinus congestion - Allergic vs postviral  3. Postnasal drip   4. Essential hypertension - Patient advised to return in one week for hypertension  follow-up. Patient advised to take medications as prescribed and to not let medications run out before refilling.   Meds ordered this encounter  Medications  . Guaifenesin (MUCINEX MAXIMUM STRENGTH) 1200 MG TB12    Sig: Take 1 tablet (1,200 mg total) by mouth 2 (two) times daily.    Dispense:  10 tablet    Refill:  0    Order Specific Question:   Supervising Provider    Answer:   Tresa Garter [id number]  . oxymetazoline (AFRIN NASAL SPRAY) 0.05 % nasal spray    Sig: Place 1 spray into both nostrils 2 (two) times daily.    Dispense:  30 mL    Refill:  0    Order Specific Question:   Supervising Provider    Answer:   Tresa Garter [id number]  . sodium chloride (OCEAN) 0.65 % SOLN nasal spray    Sig: Place 1 spray into both nostrils as needed for congestion.    Dispense:  1 Bottle    Refill:  0    Order Specific Question:   Supervising Provider    Answer:   Tresa Garter [id number]    Follow-up: Return in about 1 week (around 02/26/2017) for HTN.   Clent Demark PA

## 2017-02-19 NOTE — Patient Instructions (Signed)
Do not use Afrin for more than 3 days.    Sinusitis, Adult Sinusitis is soreness and inflammation of your sinuses. Sinuses are hollow spaces in the bones around your face. Your sinuses are located:  Around your eyes.  In the middle of your forehead.  Behind your nose.  In your cheekbones. Your sinuses and nasal passages are lined with a stringy fluid (mucus). Mucus normally drains out of your sinuses. When your nasal tissues become inflamed or swollen, the mucus can become trapped or blocked so air cannot flow through your sinuses. This allows bacteria, viruses, and funguses to grow, which leads to infection. Sinusitis can develop quickly and last for 7?10 days (acute) or for more than 12 weeks (chronic). Sinusitis often develops after a cold. What are the causes? This condition is caused by anything that creates swelling in the sinuses or stops mucus from draining, including:  Allergies.  Asthma.  Bacterial or viral infection.  Abnormally shaped bones between the nasal passages.  Nasal growths that contain mucus (nasal polyps).  Narrow sinus openings.  Pollutants, such as chemicals or irritants in the air.  A foreign object stuck in the nose.  A fungal infection. This is rare. What increases the risk? The following factors may make you more likely to develop this condition:  Having allergies or asthma.  Having had a recent cold or respiratory tract infection.  Having structural deformities or blockages in your nose or sinuses.  Having a weak immune system.  Doing a lot of swimming or diving.  Overusing nasal sprays.  Smoking. What are the signs or symptoms? The main symptoms of this condition are pain and a feeling of pressure around the affected sinuses. Other symptoms include:  Upper toothache.  Earache.  Headache.  Bad breath.  Decreased sense of smell and taste.  A cough that may get worse at night.  Fatigue.  Fever.  Thick drainage from your  nose. The drainage is often green and it may contain pus (purulent).  Stuffy nose or congestion.  Postnasal drip. This is when extra mucus collects in the throat or back of the nose.  Swelling and warmth over the affected sinuses.  Sore throat.  Sensitivity to light. How is this diagnosed? This condition is diagnosed based on symptoms, a medical history, and a physical exam. To find out if your condition is acute or chronic, your health care provider may:  Look in your nose for signs of nasal polyps.  Tap over the affected sinus to check for signs of infection.  View the inside of your sinuses using an imaging device that has a light attached (endoscope). If your health care provider suspects that you have chronic sinusitis, you may also:  Be tested for allergies.  Have a sample of mucus taken from your nose (nasal culture) and checked for bacteria.  Have a mucus sample examined to see if your sinusitis is related to an allergy. If your sinusitis does not respond to treatment and it lasts longer than 8 weeks, you may have an MRI or CT scan to check your sinuses. These scans also help to determine how severe your infection is. In rare cases, a bone biopsy may be done to rule out more serious types of fungal sinus disease. How is this treated? Treatment for sinusitis depends on the cause and whether your condition is chronic or acute. If a virus is causing your sinusitis, your symptoms will go away on their own within 10 days. You may be  given medicines to relieve your symptoms, including:  Topical nasal decongestants. They shrink swollen nasal passages and let mucus drain from your sinuses.  Antihistamines. These drugs block inflammation that is triggered by allergies. This can help to ease swelling in your nose and sinuses.  Topical nasal corticosteroids. These are nasal sprays that ease inflammation and swelling in your nose and sinuses.  Nasal saline washes. These rinses can  help to get rid of thick mucus in your nose. If your condition is caused by bacteria, you will be given an antibiotic medicine. If your condition is caused by a fungus, you will be given an antifungal medicine. Surgery may be needed to correct underlying conditions, such as narrow nasal passages. Surgery may also be needed to remove polyps. Follow these instructions at home: Medicines   Take, use, or apply over-the-counter and prescription medicines only as told by your health care provider. These may include nasal sprays.  If you were prescribed an antibiotic medicine, take it as told by your health care provider. Do not stop taking the antibiotic even if you start to feel better. Hydrate and Humidify   Drink enough water to keep your urine clear or pale yellow. Staying hydrated will help to thin your mucus.  Use a cool mist humidifier to keep the humidity level in your home above 50%.  Inhale steam for 10-15 minutes, 3-4 times a day or as told by your health care provider. You can do this in the bathroom while a hot shower is running.  Limit your exposure to cool or dry air. Rest   Rest as much as possible.  Sleep with your head raised (elevated).  Make sure to get enough sleep each night. General instructions   Apply a warm, moist washcloth to your face 3-4 times a day or as told by your health care provider. This will help with discomfort.  Wash your hands often with soap and water to reduce your exposure to viruses and other germs. If soap and water are not available, use hand sanitizer.  Do not smoke. Avoid being around people who are smoking (secondhand smoke).  Keep all follow-up visits as told by your health care provider. This is important. Contact a health care provider if:  You have a fever.  Your symptoms get worse.  Your symptoms do not improve within 10 days. Get help right away if:  You have a severe headache.  You have persistent vomiting.  You have  pain or swelling around your face or eyes.  You have vision problems.  You develop confusion.  Your neck is stiff.  You have trouble breathing. This information is not intended to replace advice given to you by your health care provider. Make sure you discuss any questions you have with your health care provider. Document Released: 12/07/2005 Document Revised: 08/02/2016 Document Reviewed: 10/02/2015 Elsevier Interactive Patient Education  2017 [employer].

## 2017-02-22 ENCOUNTER — Encounter (HOSPITAL_COMMUNITY): Admission: RE | Admit: 2017-02-22 | Payer: Medicaid Other | Source: Ambulatory Visit

## 2017-02-22 ENCOUNTER — Encounter (HOSPITAL_COMMUNITY): Payer: Medicaid Other

## 2017-02-23 ENCOUNTER — Ambulatory Visit (INDEPENDENT_AMBULATORY_CARE_PROVIDER_SITE_OTHER): Payer: Medicaid Other | Admitting: Physician Assistant

## 2017-02-24 ENCOUNTER — Encounter (HOSPITAL_COMMUNITY): Payer: Medicaid Other

## 2017-02-24 ENCOUNTER — Encounter (HOSPITAL_COMMUNITY)
Admission: RE | Admit: 2017-02-24 | Discharge: 2017-02-24 | Disposition: A | Discharge status: Home / Self Care | Payer: Medicaid Other | Source: Ambulatory Visit | Attending: Interventional Cardiology | Admitting: Interventional Cardiology

## 2017-02-24 DIAGNOSIS — I219 Acute myocardial infarction, unspecified: Secondary | ICD-10-CM | POA: Diagnosis not present

## 2017-02-24 DIAGNOSIS — Z955 Presence of coronary angioplasty implant and graft: Secondary | ICD-10-CM | POA: Diagnosis not present

## 2017-02-24 DIAGNOSIS — I214 Non-ST elevation (NSTEMI) myocardial infarction: Secondary | ICD-10-CM

## 2017-02-24 LAB — GLUCOSE, CAPILLARY
GLUCOSE-CAPILLARY: 124 mg/dL — AB (ref 65–99)
Glucose-Capillary: 97 mg/dL (ref 65–99)

## 2017-02-24 NOTE — Progress Notes (Signed)
Daily Session Note  Patient Details  Name: Lindsay [name] MRN: [id number] Date of Birth: 06-17-1981 Referring Provider:   Flowsheet Row CARDIAC REHAB PHASE II ORIENTATION from 02/02/2017 in [facility]  Referring Provider  Daneen Schick, MD      Encounter Date: 02/24/2017  Check In:     Session Check In - 02/24/17 1207      Check-In   Location [facility]   Staff Present Maurice Small, RN, BSN;Amber Fair, MS, ACSM RCEP, Exercise Physiologist;Joann Rion, RN, Marga Melnick, RN, BSN   Supervising physician immediately available to respond to emergencies [facility] immediately available   Physician(s) Dr. Tana Coast    Medication changes reported     No   Fall or balance concerns reported    No   Tobacco Cessation No Change   Warm-up and Cool-down Performed as group-led instruction   Resistance Training Performed No   VAD Patient? No     Pain Assessment   Currently in Pain? No/denies      Capillary Blood Glucose: Results for orders placed or performed during the hospital encounter of 02/24/17 (from the past 24 hour(s))  Glucose, capillary     Status: None   Collection Time: 02/24/17 11:36 AM  Result Value Ref Range   Glucose-Capillary 97 65 - 99 mg/dL  Glucose, capillary     Status: Abnormal   Collection Time: 02/24/17 12:16 PM  Result Value Ref Range   Glucose-Capillary 124 (H) 65 - 99 mg/dL      History  Smoking Status  . Never Smoker  Smokeless Tobacco  . Never Used    Goals Met:  Exercise tolerated well Personal goals reviewed  Goals Unmet:  Not Applicable  Comments:  Pt started full exercise Phase II cardiac rehab today.  Pt tolerated light exercise with some difficulty with exercise due to back issues and recently burning her leg.  Pt continues to dress her foot with silvadene and feels her burns are slowly getting better.  Leg burns are completely scabbed over with no drainage noted.VSS,  telemetry-SR, asymptomatic.  Medication list reconciled. Pt denies barriers to medication compliance.  PSYCHOSOCIAL ASSESSMENT:  PHQ-0. Pt with some stress related to single parent of small school aged child and dealing with her multiple medical issues.  Pt arrives by bus and uses the bus stop closes to our entrance. Pt denies the need for further psychosocial interventions. Due to pt's young age and complex medical issues will perform frequent check in with pt for overall well being.  Pt enjoys bowling. Pt desires to develop an exercise routine that's comfortable and continue to monitor her diabetes.  Pt recently seen in the office by Dwyane Dee.  Will have Parke Simmers, diabetes educator, work closely with this patient.  Pt oriented to exercise equipment and routine.    Understanding verbalized. Maurice Small RN, BSN    Dr. Fransico Him is Medical Director for Cardiac Rehab at [facility].

## 2017-02-26 ENCOUNTER — Other Ambulatory Visit: Payer: Self-pay

## 2017-02-26 ENCOUNTER — Encounter (HOSPITAL_COMMUNITY)
Admission: RE | Admit: 2017-02-26 | Discharge: 2017-02-26 | Disposition: A | Discharge status: Home / Self Care | Payer: Medicaid Other | Source: Ambulatory Visit | Attending: Interventional Cardiology | Admitting: Interventional Cardiology

## 2017-02-26 ENCOUNTER — Encounter (HOSPITAL_COMMUNITY): Payer: Medicaid Other

## 2017-02-26 ENCOUNTER — Telehealth: Payer: Self-pay | Admitting: Interventional Cardiology

## 2017-02-26 DIAGNOSIS — Z955 Presence of coronary angioplasty implant and graft: Secondary | ICD-10-CM | POA: Diagnosis not present

## 2017-02-26 DIAGNOSIS — I214 Non-ST elevation (NSTEMI) myocardial infarction: Secondary | ICD-10-CM

## 2017-02-26 LAB — GLUCOSE, CAPILLARY
GLUCOSE-CAPILLARY: 165 mg/dL — AB (ref 65–99)
GLUCOSE-CAPILLARY: 180 mg/dL — AB (ref 65–99)

## 2017-02-26 MED ORDER — V-GO 20 KIT: PACK | 12 refills | Status: DC

## 2017-02-26 NOTE — Telephone Encounter (Signed)
New message      Pt has gained 5.7 lbs since last wed---no sob or other symptoms.  Please call patient after 2pm.  Lindsay [name] is faxing flow sheet to triage

## 2017-02-26 NOTE — Telephone Encounter (Signed)
No fax has been received from [facility] from Cardiac Rehab yet.   Attempted to call [name] at Cardiac Rehab, but there was no answer.  Attempted to call the patient, but there was no answer. Left a message for the patient to call back.

## 2017-02-26 NOTE — Progress Notes (Signed)
Dedra's weight is up 2.6 kg from her last exercise session on 02/24/2017. Edwina denies feeling short of breath. Weight today is 179.4 kg. Weight on Wednesday was 176.8 kg. Clarissia's oxygen saturation was 99% on room air. Lung fields clear upon ascultation. Cheyrl reports that she does not have any more edema than usual. Dr Michaelle Copas office called and notified about today's weight gain. Henry said she ate oodles of noodles last night for dinner and a salad. Latiqua reports that she drinks a lot of water. Patient reminded to be careful about her salt intake.Will fax exercise flow sheets to Dr. Michaelle Copas office for review.Gladstone Lighter, RN,BSN 02/26/2017 12:59 PM

## 2017-02-27 IMAGING — XA IR US GUIDE VASC ACCESS LEFT
1 series · 2 of 2 positions shown · non-contrast
Comparison: none

CLINICAL DATA: Cellulitis

[Series 1: run · 2 of 2 slices shown]
[im 1/2]
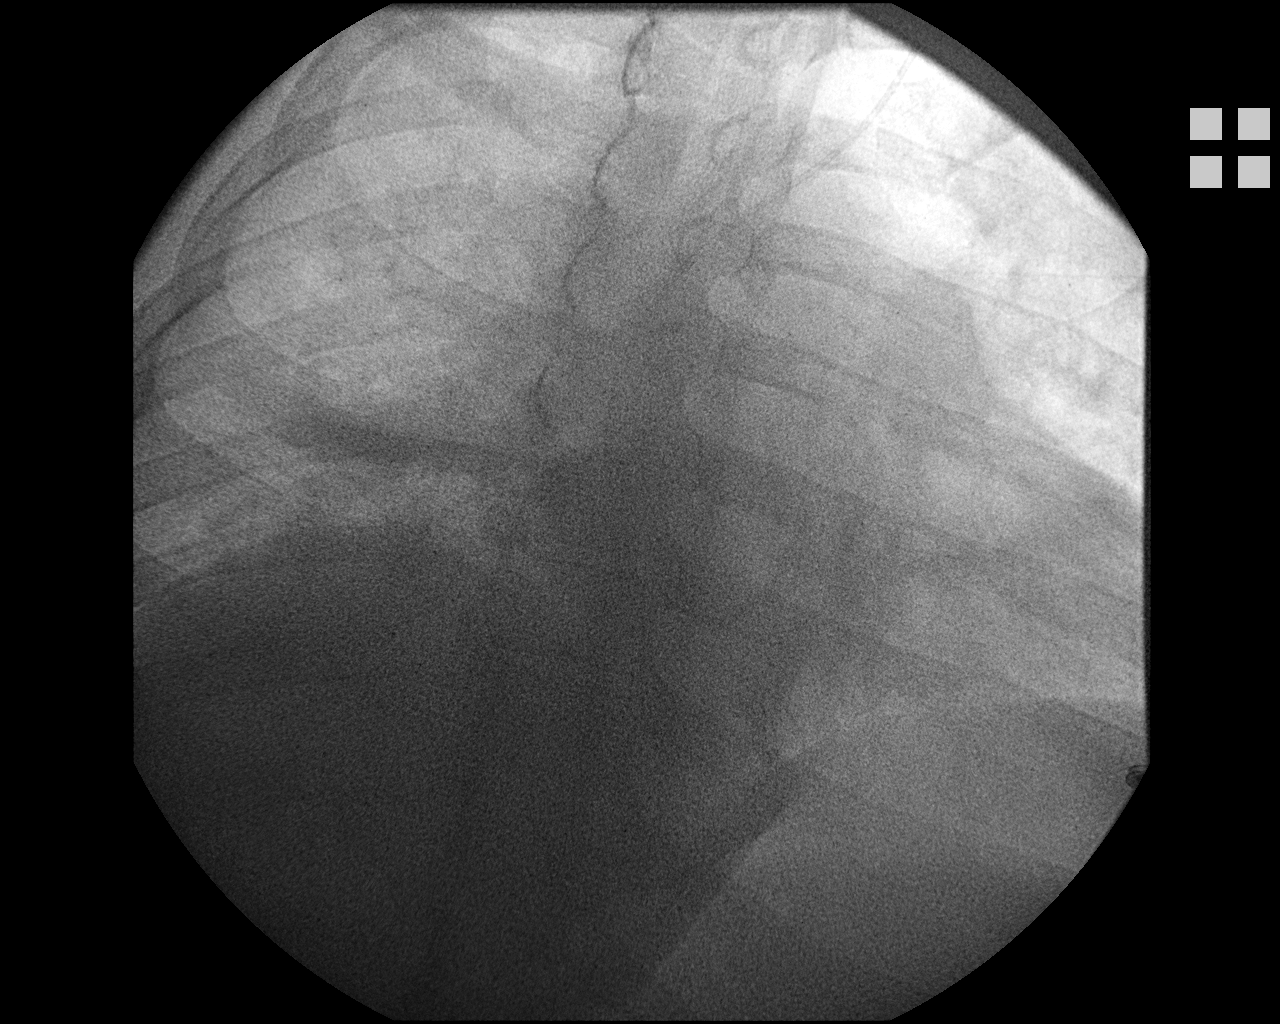
[im 2/2]
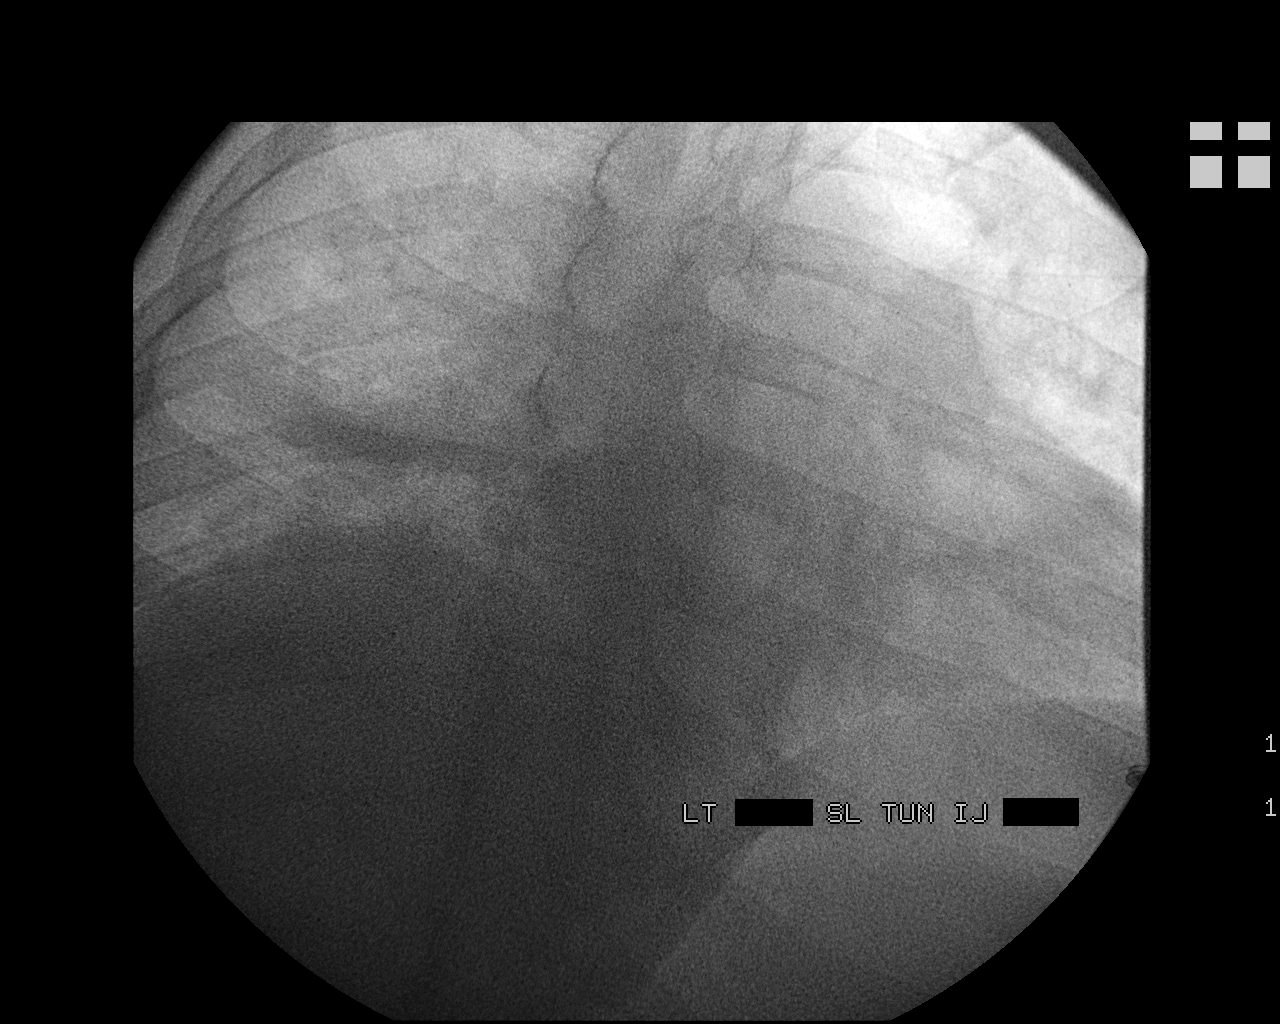

[2 of 2 positions shown; findings below may reference images not displayed]

EXAM:
TUNNELED LEFT INTERNAL JUGULAR PICC LINE PLACEMENT WITH ULTRASOUND
AND FLUOROSCOPIC GUIDANCE

FLUOROSCOPY TIME:  12 seconds.

PROCEDURE:
The patient was advised of the possible risks andcomplications and
agreed to undergo the procedure. The patient was then brought to the
angiographic suite for the procedure.

The left neck was prepped with chlorhexidine, drapedin the usual
sterile fashion using maximum barrier technique (cap and mask,
sterile gown, sterile gloves, large sterile sheet, hand hygiene and
cutaneous antisepsis) and infiltrated locally with 1% Lidocaine.

Ultrasound demonstrated patency of the left jugular vein, and this
was documented with an image. Under real-time ultrasound guidance,
this vein was accessed with a 21 gauge micropuncture needle and
image documentation was performed. A long tract was utilized to
create a tunnel. A 0.018 wire was introduced in to the vein. Over
this, a 5 French single lumen Power tunneled PICC was advanced to
the lower SVC/right atrial junction. The cuff was positioned in the
subcutaneous tract. Fluoroscopy during the procedure and fluoro spot
radiograph confirms appropriate catheter position. The catheter was
flushed and covered with asterile dressing.

Catheter length: 24 cm

COMPLICATIONS:
None
IMPRESSION: Successful left jugular tunneled power PICC line placement with
ultrasound and fluoroscopic guidance. The catheter is ready for use.

## 2017-03-01 ENCOUNTER — Other Ambulatory Visit: Payer: Self-pay

## 2017-03-01 ENCOUNTER — Encounter (HOSPITAL_COMMUNITY): Payer: Medicaid Other

## 2017-03-01 MED ORDER — V-GO 20 KIT: PACK | 12 refills | Status: DC

## 2017-03-01 NOTE — Telephone Encounter (Signed)
She needs to weigh daily to allow [place] to get a trend all whether the weight is stable, increasing, or decreasing. After this additional measures may be necessary depending upon what we see.Marland Kitchen

## 2017-03-01 NOTE — Telephone Encounter (Signed)
Spoke with pt and made her aware of recommendations from Dr. Katrinka Blazing.  Pt verbalized understanding and was in agreement with this plan.

## 2017-03-01 NOTE — Telephone Encounter (Signed)
Spoke with Lindsay [name] and she states that she does have some swelling in BLE.  Swelling improves at night with elevation.  Denies SOB or increased salt in diet.  Is taking meds as currently prescribed.  She has not weighed since Friday.  Will send message to Dr. Katrinka Blazing for review and advisement.

## 2017-03-03 ENCOUNTER — Encounter (HOSPITAL_COMMUNITY): Payer: Medicaid Other

## 2017-03-05 ENCOUNTER — Encounter (HOSPITAL_COMMUNITY): Payer: Medicaid Other

## 2017-03-08 ENCOUNTER — Encounter (HOSPITAL_COMMUNITY): Payer: Medicaid Other

## 2017-03-10 ENCOUNTER — Encounter: Payer: Medicaid Other | Admitting: Nutrition

## 2017-03-10 ENCOUNTER — Encounter (HOSPITAL_COMMUNITY): Payer: Medicaid Other

## 2017-03-10 ENCOUNTER — Encounter (HOSPITAL_COMMUNITY): Admission: RE | Admit: 2017-03-10 | Payer: Medicaid Other | Source: Ambulatory Visit

## 2017-03-12 ENCOUNTER — Telehealth (HOSPITAL_COMMUNITY): Payer: Self-pay | Admitting: *Deleted

## 2017-03-12 ENCOUNTER — Encounter (HOSPITAL_COMMUNITY): Payer: Medicaid Other

## 2017-03-12 NOTE — Telephone Encounter (Signed)
Pt absent for several session - unsure of the reason.  Pt called and left message to please contact cardiac rehab.  Contact information provided. Alanson Aly, BSN Cardiac and Emergency planning/management officer

## 2017-03-15 ENCOUNTER — Encounter (HOSPITAL_COMMUNITY): Admission: RE | Admit: 2017-03-15 | Payer: Medicaid Other | Source: Ambulatory Visit

## 2017-03-17 ENCOUNTER — Encounter (HOSPITAL_COMMUNITY)
Admission: RE | Admit: 2017-03-17 | Discharge: 2017-03-17 | Disposition: A | Discharge status: Home / Self Care | Payer: Medicaid Other | Source: Ambulatory Visit

## 2017-03-17 ENCOUNTER — Encounter (HOSPITAL_COMMUNITY): Payer: Self-pay | Admitting: *Deleted

## 2017-03-17 ENCOUNTER — Telehealth: Payer: Self-pay | Admitting: Endocrinology

## 2017-03-17 ENCOUNTER — Ambulatory Visit: Payer: Medicaid Other | Admitting: Endocrinology

## 2017-03-17 NOTE — Progress Notes (Signed)
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 education and support for participant on nutrition & aerobic/resistive exercise along with prescribed medications to achieve LDL <7032mHDL >37m37m Expected Outcomes Long Term: Cholesterol controlled with medications as prescribed, with individualized exercise RX and with personalized nutrition plan. Value goals: LDL < 70mg53mL > 40 mg.   Stress Yes   Intervention Offer individual and/or small group education and counseling on adjustment to heart disease, stress management and health-related lifestyle change. Teach and support self-help strategies.;Refer participants experiencing significant psychosocial distress to appropriate mental health specialists for further evaluation and treatment. When possible, include family  members and significant others in education/counseling sessions.   Expected Outcomes Short Term: Participant demonstrates changes in health-related behavior, relaxation and other stress management skills, ability to obtain effective social support, and compliance with psychotropic medications if prescribed.;Long Term: Emotional wellbeing is indicated by absence of clinically significant psychosocial distress or social isolation.   Personal Goal Other Yes   Personal Goal Better limitations and understanding of activity levels/exercise. Develop an exercise routine. Better control and management of diabetes   Intervention Provide nutrition counseling on diabetes and heart healthy diet. Provide exercise programming to assist with improving overall fitness level.   Expected Outcomes Pt will have better understanding and management of diabetes. Pt will also have an established exercise routine and feel comfortable with activity/exertion.      Core Components/Risk Factors/Patient Goals Review:    Core Components/Risk Factors/Patient Goals at Discharge (Final Review):    ITP Comments:     ITP Comments    Row Name 02/02/17 0848           ITP Comments Dr. TraciFransico Himical Director           Comments:  Pt completed 3 sessions. Pt last exercised on 3/9. Pt has not responded back to messages left. Recommend continued exercise and life style modification education including  stress management and relaxation techniques to decrease cardiac risk profile. CarleCherre Huger Cardiac and PulmoTraining and development officer

## 2017-03-17 NOTE — Telephone Encounter (Signed)
Patient no showed today's appt. Please advise on how to follow up. °A. No follow up necessary. °B. Follow up urgent. Contact patient immediately. °C. Follow up necessary. Contact patient and schedule visit in ___ days. °D. Follow up advised. Contact patient and schedule visit in ____weeks. ° °

## 2017-03-19 ENCOUNTER — Encounter (HOSPITAL_COMMUNITY): Payer: Medicaid Other

## 2017-03-22 ENCOUNTER — Encounter (HOSPITAL_COMMUNITY): Payer: Medicaid Other

## 2017-03-22 NOTE — Telephone Encounter (Signed)
Needs to be rescheduled for follow-up as soon as possible, please let her know that if she is not regular with follow-up we may need to dismiss her

## 2017-03-24 ENCOUNTER — Encounter (HOSPITAL_COMMUNITY): Payer: Medicaid Other

## 2017-03-24 NOTE — Telephone Encounter (Signed)
LM for pt to call to schedule.

## 2017-03-25 ENCOUNTER — Telehealth (HOSPITAL_COMMUNITY): Payer: Self-pay | Admitting: *Deleted

## 2017-03-25 NOTE — Telephone Encounter (Signed)
Continued absence from rehab. Called and spoke to pt. Pt indicated she had some issues going on in the home.  Inquired if pt wished to continue with cardiac rehab. Pt indicated that she did.  Asked pt when she planned to return to rehab. Pt indicated that she was planning on coming tomorrow because she would not have any children.  Advised pt she will need to improve on her attendance to cardiac rehab in order to continue to participate.  Pt indicated she would "try". Alanson Aly, BSN Cardiac and Emergency planning/management officer

## 2017-03-26 ENCOUNTER — Encounter (HOSPITAL_COMMUNITY)
Admission: RE | Admit: 2017-03-26 | Discharge: 2017-03-26 | Disposition: A | Discharge status: Home / Self Care | Payer: Medicaid Other | Source: Ambulatory Visit | Attending: Interventional Cardiology | Admitting: Interventional Cardiology

## 2017-03-26 ENCOUNTER — Telehealth: Payer: Self-pay | Admitting: Interventional Cardiology

## 2017-03-26 DIAGNOSIS — I214 Non-ST elevation (NSTEMI) myocardial infarction: Secondary | ICD-10-CM

## 2017-03-26 DIAGNOSIS — Z955 Presence of coronary angioplasty implant and graft: Secondary | ICD-10-CM | POA: Diagnosis present

## 2017-03-26 DIAGNOSIS — I219 Acute myocardial infarction, unspecified: Secondary | ICD-10-CM | POA: Insufficient documentation

## 2017-03-26 LAB — GLUCOSE, CAPILLARY: GLUCOSE-CAPILLARY: 174 mg/dL — AB (ref 65–99)

## 2017-03-26 NOTE — Telephone Encounter (Signed)
New message      Calling to let the doctor know that pt has a 3lb wt gain.  Pt feels like her feet is swollen but was able to exercise.  [name] will fax over the flo sheet.

## 2017-03-26 NOTE — Progress Notes (Signed)
Lindsay [name] returned to cardiac rehab after being absent since 02/26/2017. Lindsay [name] weight is up 1.4 kg. Upon assessment lung fields clear upon ascultaion. Lindsay [name] says she feels she is retaining fluid. Oxygen saturation 100% on room air. Lindsay [name] exercised without difficulty. Lindsay [name] was encouraged to watch her salt intake. Patient states understanding.Will fax exercise flow sheets to Dr. Michaelle Copas  office for review.Gladstone Lighter, RN,BSN 03/26/2017 2:12 PM

## 2017-03-26 NOTE — Progress Notes (Signed)
Reviewed home exercise guidelines with patient including endpoints, temperature precautions, target heart rate and rate of perceived exertion. Pt plans to walk 10 minutes, 3 times/day 1-2 days per week as her mode of home exercise. Pt voices understanding of instructions given.  Artist Pais, MS, ACSM CEP

## 2017-03-29 ENCOUNTER — Encounter (HOSPITAL_COMMUNITY): Payer: Medicaid Other

## 2017-03-30 ENCOUNTER — Other Ambulatory Visit: Payer: Self-pay | Admitting: Internal Medicine

## 2017-03-30 DIAGNOSIS — R200 Anesthesia of skin: Secondary | ICD-10-CM

## 2017-03-30 DIAGNOSIS — R202 Paresthesia of skin: Principal | ICD-10-CM

## 2017-03-30 NOTE — Telephone Encounter (Signed)
Paperwork obtained from Cardiac Rehab.  Spoke with pt and she states that she has noticed some swelling in her feet and hands.  Denies SOB.  No vitals available.  Denies changes in diet or added salt.  Elevates feet when she can and states this improves most of them time.  States she has felt like she has been retaining fluid since 2016.  She had previously mentioned this to her PCP and they advised her to continue to monitor.  Pt has appt with Dr. Katrinka Blazing on 04/07/17.  Spoke with Dr. Katrinka Blazing and he advised for now we will plan to address this at her appt.  Spoke with pt and made her aware of his recommendations.  Pt verbalized understanding and was in agreement with this plan.

## 2017-03-31 ENCOUNTER — Encounter (HOSPITAL_COMMUNITY)
Admission: RE | Admit: 2017-03-31 | Discharge: 2017-03-31 | Disposition: A | Discharge status: Home / Self Care | Payer: Medicaid Other | Source: Ambulatory Visit | Attending: Interventional Cardiology | Admitting: Interventional Cardiology

## 2017-03-31 DIAGNOSIS — Z955 Presence of coronary angioplasty implant and graft: Secondary | ICD-10-CM | POA: Diagnosis not present

## 2017-03-31 DIAGNOSIS — I214 Non-ST elevation (NSTEMI) myocardial infarction: Secondary | ICD-10-CM

## 2017-03-31 LAB — GLUCOSE, CAPILLARY: Glucose-Capillary: 153 mg/dL — ABNORMAL HIGH (ref 65–99)

## 2017-04-02 ENCOUNTER — Telehealth: Payer: Self-pay | Admitting: Physician Assistant

## 2017-04-02 ENCOUNTER — Encounter (HOSPITAL_COMMUNITY)
Admission: RE | Admit: 2017-04-02 | Discharge: 2017-04-02 | Disposition: A | Discharge status: Home / Self Care | Payer: Medicaid Other | Source: Ambulatory Visit | Attending: Interventional Cardiology | Admitting: Interventional Cardiology

## 2017-04-02 ENCOUNTER — Other Ambulatory Visit (INDEPENDENT_AMBULATORY_CARE_PROVIDER_SITE_OTHER): Payer: Self-pay | Admitting: Physician Assistant

## 2017-04-02 DIAGNOSIS — R202 Paresthesia of skin: Principal | ICD-10-CM

## 2017-04-02 DIAGNOSIS — R200 Anesthesia of skin: Secondary | ICD-10-CM

## 2017-04-02 DIAGNOSIS — Z955 Presence of coronary angioplasty implant and graft: Secondary | ICD-10-CM | POA: Diagnosis not present

## 2017-04-02 DIAGNOSIS — I214 Non-ST elevation (NSTEMI) myocardial infarction: Secondary | ICD-10-CM

## 2017-04-02 LAB — GLUCOSE, CAPILLARY
GLUCOSE-CAPILLARY: 102 mg/dL — AB (ref 65–99)
Glucose-Capillary: 102 mg/dL — ABNORMAL HIGH (ref 65–99)

## 2017-04-02 MED ORDER — GABAPENTIN 300 MG PO CAPS: 600.0000 mg | ORAL_CAPSULE | Freq: Four times a day (QID) | ORAL | 3 refills | Status: DC

## 2017-04-02 NOTE — Telephone Encounter (Signed)
Patient call to request a refill for gabapentin (NEURONTIN) 300 MG capsule  She is out of the medication please follow with patient

## 2017-04-02 NOTE — Progress Notes (Signed)
Gabapentin refiled.

## 2017-04-05 ENCOUNTER — Encounter (HOSPITAL_COMMUNITY): Payer: Medicaid Other

## 2017-04-07 ENCOUNTER — Encounter: Payer: Self-pay | Admitting: Interventional Cardiology

## 2017-04-07 ENCOUNTER — Ambulatory Visit (INDEPENDENT_AMBULATORY_CARE_PROVIDER_SITE_OTHER): Payer: Medicaid Other | Admitting: Interventional Cardiology

## 2017-04-07 ENCOUNTER — Encounter (HOSPITAL_COMMUNITY)
Admission: RE | Admit: 2017-04-07 | Discharge: 2017-04-07 | Disposition: A | Discharge status: Home / Self Care | Payer: Medicaid Other | Source: Ambulatory Visit | Attending: Interventional Cardiology | Admitting: Interventional Cardiology

## 2017-04-07 VITALS — BP 130/88 | HR 83 | Ht 67.5 in | Wt 399.4 lb

## 2017-04-07 DIAGNOSIS — I2510 Atherosclerotic heart disease of native coronary artery without angina pectoris: Secondary | ICD-10-CM | POA: Diagnosis not present

## 2017-04-07 DIAGNOSIS — I10 Essential (primary) hypertension: Secondary | ICD-10-CM | POA: Diagnosis not present

## 2017-04-07 DIAGNOSIS — I214 Non-ST elevation (NSTEMI) myocardial infarction: Secondary | ICD-10-CM

## 2017-04-07 DIAGNOSIS — Z955 Presence of coronary angioplasty implant and graft: Secondary | ICD-10-CM | POA: Diagnosis not present

## 2017-04-07 DIAGNOSIS — G4733 Obstructive sleep apnea (adult) (pediatric): Secondary | ICD-10-CM | POA: Diagnosis not present

## 2017-04-07 DIAGNOSIS — I252 Old myocardial infarction: Secondary | ICD-10-CM

## 2017-04-07 DIAGNOSIS — E089 Diabetes mellitus due to underlying condition without complications: Secondary | ICD-10-CM | POA: Diagnosis not present

## 2017-04-07 LAB — GLUCOSE, CAPILLARY
GLUCOSE-CAPILLARY: 117 mg/dL — AB (ref 65–99)
GLUCOSE-CAPILLARY: 82 mg/dL (ref 65–99)
Glucose-Capillary: 83 mg/dL (ref 65–99)

## 2017-04-07 NOTE — Patient Instructions (Signed)
Medication Instructions:  Your physician recommends that you continue on your current medications as directed. Please refer to the Current Medication list given to you today.  Labwork: None ordered  Testing/Procedures: None ordered  Follow-Up: Your physician wants you to follow-up in 6 to 8 months with Dr. Katrinka Blazing. You will receive a reminder letter in the mail two months in advance. If you don't receive a letter, please call our office to schedule the follow-up appointment.   Any Other Special Instructions Will Be Listed Below (If Applicable).     If you need a refill on your cardiac medications before your next appointment, please call your pharmacy.

## 2017-04-07 NOTE — Progress Notes (Signed)
Cardiology Office Note    Date:  04/07/2017   ID:  SHANETTA [name], DOB 02-12-1981, MRN [id number]  PCP:  Lindsay Demark, PA-C  Cardiologist: Lindsay Grooms, MD   Chief Complaint  Patient presents with  . Coronary Artery Disease    History of Present Illness:  Lindsay [name] is a 36 y.o. female follow up for NSTEMI September 2017, with pk troponin 6.64 with cath and PCI with DES to mLAD and PTCA only to 2nd diag ostium. Still with 50% residual stenosis. EF with cath 35-45%but Echo was 55-60%.   Lindsay [name] feels tired all the time. She has chronic insomnia. She has occasional chest discomfort with heavy physical activity that is completely dissimilar to her presentation with non-ST elevation myocardial infarction in September 2017. Her participation in cardiac rehabilitation has been spotty.  Past Medical History:  Diagnosis Date  . ARF (acute renal failure) ([place]) 04/2015  . Asthma   . Cellulitis of right upper extremity   . Coronary artery disease   . Diabetes mellitus    insulin dependent  . Hyperlipidemia LDL goal <70   . Hypertension   . NSTEMI (non-ST elevated myocardial infarction) ([place]) 08/2016  . Obesity   . S/P angioplasty with stent 08/2016   DES to mLAD and PTCA only to 2nd diag ostium.     Past Surgical History:  Procedure Laterality Date  . CARDIAC CATHETERIZATION N/A 09/07/2016   Procedure: Left Heart Cath and Coronary Angiography;  Surgeon: [name], MD;  Location: [facility];  Service: Cardiovascular;  Laterality: N/A;  . CARDIAC CATHETERIZATION N/A 09/07/2016   Procedure: Coronary Stent Intervention;  Surgeon: [name], MD;  Location: [facility];  Service: Cardiovascular;  Laterality: N/A;  . CARDIAC CATHETERIZATION N/A 09/07/2016   Procedure: Coronary Balloon Angioplasty;  Surgeon: [name], MD;  Location: [facility];  Service: Cardiovascular;  Laterality: N/A;  . CESAREAN SECTION    . IRRIGATION AND  DEBRIDEMENT SHOULDER Right 04/30/2015   Procedure: IRRIGATION AND DEBRIDEMENT SHOULDER;  Surgeon: Renette Butters, MD;  Location: [facility];  Service: Orthopedics;  Laterality: Right;  . IRRIGATION AND DEBRIDEMENT SHOULDER Right 05/01/2015  . LEG SURGERY    . SHOULDER ARTHROSCOPY Right 04/30/2015   Procedure: ARTHROSCOPY SHOULDER;  Surgeon: Renette Butters, MD;  Location: [facility];  Service: Orthopedics;  Laterality: Right;  . TONSILLECTOMY      Current Medications: Outpatient Medications Prior to Visit  Medication Sig Dispense Refill  . ACCU-CHEK SOFTCLIX LANCETS lancets Use as instructed for 4 times daily blood glucose monitoring 100 each 12  . albuterol (PROVENTIL HFA;VENTOLIN HFA) 108 (90 Base) MCG/ACT inhaler Inhale 2 puffs into the lungs every 6 (six) hours as needed for wheezing or shortness of breath. 1 Inhaler 2  . aspirin 81 MG tablet Take 81 mg by mouth daily.    Marland Kitchen atorvastatin (LIPITOR) 80 MG tablet Take 1 tablet (80 mg total) by mouth daily at 6 PM. 30 tablet 6  . Blood Glucose Monitoring Suppl (ACCU-CHEK AVIVA PLUS) w/Device KIT Use as directed for 4 times daily blood glucose monitoring 1 kit 0  . empagliflozin (JARDIANCE) 10 MG TABS tablet Take 10 mg by mouth daily with breakfast. 30 tablet 3  . gabapentin (NEURONTIN) 300 MG capsule Take 2 capsules (600 mg total) by mouth 4 (four) times daily. 240 capsule 3  . glucose blood (ACCU-CHEK AVIVA PLUS) test strip Use as instructed for 4 times  daily blood glucose monitoring 100 each 12  . Insulin Glargine (LANTUS SOLOSTAR) 100 UNIT/ML Solostar Pen Inject 60 units at bedtime. 10 pen 1  . Insulin Pen Needle 31G X 5 MM MISC 1 application by Does not apply route Nightly. 1000 each 3  . insulin regular human CONCENTRATED (HUMULIN R) 500 UNIT/ML injection 70 units 30 minutes before each meal; 50 at bedtime. 80 mL 2  . Insulin Syringe-Needle U-100 (BD INSULIN SYRINGE ULTRAFINE) 31G X 15/64" 1 ML MISC Use to inject insulin daily 100 each 1  . Lancet  Devices (ACCU-CHEK SOFTCLIX) lancets Use as instructed for 4 times daily blood glucose monitoring 1 each 0  . losartan (COZAAR) 25 MG tablet Take 1 tablet (25 mg total) by mouth daily. 30 tablet 6  . meloxicam (MOBIC) 15 MG tablet TAKE ONE TABLET BY MOUTH ONCE DAILY .MUST  HAVE  OFFICE  VISIT  FOR  REFILLS 30 tablet 0  . methocarbamol (ROBAXIN) 500 MG tablet TAKE ONE TABLET BY MOUTH 4 TIMES DAILY 120 tablet 2  . metoprolol tartrate (LOPRESSOR) 50 MG tablet Take 1 tablet (50 mg total) by mouth 2 (two) times daily. 120 tablet 3  . nitroGLYCERIN (NITROSTAT) 0.4 MG SL tablet Place 1 tablet (0.4 mg total) under the tongue every 5 (five) minutes as needed for chest pain. 25 tablet 12  . sodium chloride (OCEAN) 0.65 % SOLN nasal spray Place 1 spray into both nostrils as needed for congestion. 1 Bottle 0  . Insulin Disposable Pump (V-GO 20) KIT Use 1 pod daily for insulin DX code- E11.40 (Patient not taking: Reported on 04/07/2017) 30 kit 12  . amLODipine (NORVASC) 5 MG tablet Take 1 tablet (5 mg total) by mouth daily. 90 tablet 3  . exenatide (BYETTA 5 MCG PEN) 5 MCG/0.02ML SOPN injection Inject 0.02 mLs (5 mcg total) into the skin 2 (two) times daily before a meal. (Patient not taking: Reported on 04/07/2017) 1 pen 2  . ibuprofen (ADVIL,MOTRIN) 600 MG tablet Take 1 tablet (600 mg total) by mouth every 8 (eight) hours as needed. (Patient not taking: Reported on 04/07/2017) 30 tablet 0  . medroxyPROGESTERone (DEPO-PROVERA) 150 MG/ML injection Inject 150 mg into the muscle every 3 (three) months.    . Prenatal Vit-Fe Fumarate-FA (PRENATAL MULTIVITAMIN/IRON PO) Take 1 tablet by mouth daily.    . silver sulfADIAZINE (SILVADENE) 1 % cream Apply 1 application topically daily. (Patient not taking: Reported on 04/07/2017) 400 g 0  . ticagrelor (BRILINTA) 90 MG TABS tablet Take 1 tablet (90 mg total) by mouth 2 (two) times daily. (Patient not taking: Reported on 04/07/2017) 60 tablet 11   No facility-administered  medications prior to visit.      Allergies:   Hydrazine yellow [tartrazine]; Lisinopril; and Tylenol [acetaminophen]   Social History   Social History  . Marital status: Single    Spouse name: N/A  . Number of children: N/A  . Years of education: N/A   Social History Main Topics  . Smoking status: Never Smoker  . Smokeless tobacco: Never Used  . Alcohol use Yes     Comment: socially  . Drug use: No  . Sexual activity: Not Asked   Other Topics Concern  . None   Social History Narrative  . None     Family History:  The patient's family history includes Cancer in her maternal grandmother; Diabetes in her father and mother; Heart disease in her father and mother; Hypertension in her mother; Stroke in her maternal  grandmother.   ROS:   Please see the history of present illness.    Complains of headaches, anxiety, external stress, wheezing, back pain, shortness of breath on exertion, orthopnea, lower extremity swelling, and weight gain.  All other systems reviewed and are negative.   PHYSICAL EXAM:   VS:  BP 130/88 (BP Location: Left Arm)   Pulse 83   Ht 5' 7.5" (1.715 m)   Wt (!) 399 lb 6.4 oz (181.2 kg)   BMI 61.63 kg/m    GEN: Well nourished, well developed, in no acute distress  HEENT: normal  Neck: no JVD, carotid bruits, or masses Cardiac: RRR; no murmurs, rubs, or gallops,no edema  Respiratory:  clear to auscultation bilaterally, normal work of breathing GI: soft, nontender, nondistended, + BS MS: no deformity or atrophy  Skin: warm and dry, no rash Neuro:  Alert and Oriented x 3, Strength and sensation are intact Psych: euthymic mood, full affect  Wt Readings from Last 3 Encounters:  04/07/17 (!) 399 lb 6.4 oz (181.2 kg)  02/19/17 (!) 399 lb (181 kg)  02/18/17 (!) 397 lb (180.1 kg)      Studies/Labs Reviewed:   EKG:  EKG  Not repeated  Recent Labs: 09/16/2016: Hemoglobin 11.2; Platelets 313 01/18/2017: ALT 14 02/18/2017: BUN 15; Creatinine, Ser 0.90;  Potassium 3.9; Sodium 137   Lipid Panel    Component Value Date/Time   CHOL 124 10/28/2016 0856   TRIG 174 (H) 10/28/2016 0856   HDL 35 (L) 10/28/2016 0856   CHOLHDL 3.5 10/28/2016 0856   VLDL 35 (H) 10/28/2016 0856   LDLCALC 54 10/28/2016 0856    Additional studies/ records that were reviewed today include:  None    ASSESSMENT:    1. CAD in native artery   2. Essential hypertension   3. OSA (obstructive sleep apnea)   4. Diabetes mellitus due to underlying condition without complication, without long-term current use of insulin ([place])   5. MI, old      PLAN:  In order of problems listed above:  1. Encouraged engagement in cardiac rehabilitation. Encouraged aerobic activity to help with risk factors including sleep apnea, hypertension and diabetes control. 2. Target blood pressure 130/90 mmHg or less. 3. Continue to suspect sleep apnea however studies of not demonstrated the diagnosis. She complains of insomnia. She has difficulty falling off to sleep. 4. Followed by primary care 5. Known normal LV function. Had LAD total occlusion but collaterals from the right coronary had helped to preserve LV function.  Clinical follow-up in 6-9 months. Encouraged engagement in cardiac rehabilitation.    Medication Adjustments/Labs and Tests Ordered: Current medicines are reviewed at length with the patient today.  Concerns regarding medicines are outlined above.  Medication changes, Labs and Tests ordered today are listed in the Patient Instructions below. Patient Instructions  Medication Instructions:  Your physician recommends that you continue on your current medications as directed. Please refer to the Current Medication list given to you today.  Labwork: None ordered  Testing/Procedures: None ordered  Follow-Up: Your physician wants you to follow-up in 6 to 8 months with Dr. [name]. You will receive a reminder letter in the mail two months in advance. If you don't receive  a letter, please call our office to schedule the follow-up appointment.   Any Other Special Instructions Will Be Listed Below (If Applicable).     If you need a refill on your cardiac medications before your next appointment, please call your pharmacy.  Signed, Lindsay Grooms, MD  04/07/2017 11:02 AM    [facility] [address], [place], [place]  [address] Phone: [phone]; Fax: [phone]

## 2017-04-09 ENCOUNTER — Encounter (HOSPITAL_COMMUNITY)
Admission: RE | Admit: 2017-04-09 | Discharge: 2017-04-09 | Disposition: A | Discharge status: Home / Self Care | Payer: Medicaid Other | Source: Ambulatory Visit | Attending: Interventional Cardiology | Admitting: Interventional Cardiology

## 2017-04-09 DIAGNOSIS — Z955 Presence of coronary angioplasty implant and graft: Secondary | ICD-10-CM | POA: Diagnosis not present

## 2017-04-09 DIAGNOSIS — I214 Non-ST elevation (NSTEMI) myocardial infarction: Secondary | ICD-10-CM

## 2017-04-09 LAB — GLUCOSE, CAPILLARY
GLUCOSE-CAPILLARY: 99 mg/dL (ref 65–99)
Glucose-Capillary: 107 mg/dL — ABNORMAL HIGH (ref 65–99)

## 2017-04-12 ENCOUNTER — Encounter (HOSPITAL_COMMUNITY): Payer: Medicaid Other

## 2017-04-13 ENCOUNTER — Ambulatory Visit (INDEPENDENT_AMBULATORY_CARE_PROVIDER_SITE_OTHER): Payer: Medicaid Other | Admitting: Physician Assistant

## 2017-04-13 ENCOUNTER — Encounter (INDEPENDENT_AMBULATORY_CARE_PROVIDER_SITE_OTHER): Payer: Self-pay | Admitting: Physician Assistant

## 2017-04-13 VITALS — BP 130/81 | HR 94 | Temp 98.0°F | Ht 67.5 in | Wt >= 6400 oz

## 2017-04-13 DIAGNOSIS — R739 Hyperglycemia, unspecified: Secondary | ICD-10-CM | POA: Diagnosis not present

## 2017-04-13 DIAGNOSIS — I10 Essential (primary) hypertension: Secondary | ICD-10-CM | POA: Diagnosis not present

## 2017-04-13 DIAGNOSIS — Z79899 Other long term (current) drug therapy: Secondary | ICD-10-CM

## 2017-04-13 DIAGNOSIS — R600 Localized edema: Secondary | ICD-10-CM

## 2017-04-13 DIAGNOSIS — M5442 Lumbago with sciatica, left side: Secondary | ICD-10-CM | POA: Diagnosis not present

## 2017-04-13 LAB — POCT GLYCOSYLATED HEMOGLOBIN (HGB A1C): HEMOGLOBIN A1C: 9.3

## 2017-04-13 MED ORDER — TRAMADOL HCL 50 MG PO TABS: 50.0000 mg | ORAL_TABLET | Freq: Three times a day (TID) | ORAL | 0 refills | Status: DC | PRN

## 2017-04-13 MED ORDER — HYDROCHLOROTHIAZIDE 25 MG PO TABS: 25.0000 mg | ORAL_TABLET | Freq: Every day | ORAL | 1 refills | Status: DC

## 2017-04-13 MED ORDER — DIPHENHYDRAMINE HCL 25 MG PO TABS: 25.0000 mg | ORAL_TABLET | Freq: Four times a day (QID) | ORAL | 0 refills | Status: DC | PRN

## 2017-04-13 NOTE — Patient Instructions (Signed)

## 2017-04-13 NOTE — Progress Notes (Signed)
Subjective:  Patient ID: Lindsay [name], female    DOB: 03/22/1981  Age: 36 y.o. MRN: [id number]  CC: f/u HTN, back pain  HPI Lindsay [name] is a 36 y.o. female with a PMH of multiple comorbidities presents to f/u on HTN and to discuss back pain. Onset of back pain 17 years ago. Flung back pack over the shoulder and hit her "dead center" on the lower back. Has never been to physical therapy. XR L spine on 11/05/16 showed the following:  Mild multilevel degenerative disc disease, greatest at L2-3.  Also would like to address HTN. Has been taking medications as directed and feels better but has noticed fluid retention of the lower extremities. Has cut down on her salt intake. Denies current CP or palpitations. Is going to cardiac rehab regularly. However, her exercises are limited due to her back pain.       Outpatient Medications Prior to Visit  Medication Sig Dispense Refill  . ACCU-CHEK SOFTCLIX LANCETS lancets Use as instructed for 4 times daily blood glucose monitoring 100 each 12  . albuterol (PROVENTIL HFA;VENTOLIN HFA) 108 (90 Base) MCG/ACT inhaler Inhale 2 puffs into the lungs every 6 (six) hours as needed for wheezing or shortness of breath. 1 Inhaler 2  . amLODipine (NORVASC) 5 MG tablet Take 5 mg by mouth daily.    Marland Kitchen aspirin 81 MG tablet Take 81 mg by mouth daily.    Marland Kitchen atorvastatin (LIPITOR) 80 MG tablet Take 1 tablet (80 mg total) by mouth daily at 6 PM. 30 tablet 6  . Blood Glucose Monitoring Suppl (ACCU-CHEK AVIVA PLUS) w/Device KIT Use as directed for 4 times daily blood glucose monitoring 1 kit 0  . empagliflozin (JARDIANCE) 10 MG TABS tablet Take 10 mg by mouth daily with breakfast. 30 tablet 3  . gabapentin (NEURONTIN) 300 MG capsule Take 2 capsules (600 mg total) by mouth 4 (four) times daily. 240 capsule 3  . glucose blood (ACCU-CHEK AVIVA PLUS) test strip Use as instructed for 4 times daily blood glucose monitoring 100 each 12  . Insulin Glargine (LANTUS  SOLOSTAR) 100 UNIT/ML Solostar Pen Inject 60 units at bedtime. 10 pen 1  . Insulin Pen Needle 31G X 5 MM MISC 1 application by Does not apply route Nightly. 1000 each 3  . insulin regular human CONCENTRATED (HUMULIN R) 500 UNIT/ML injection 70 units 30 minutes before each meal; 50 at bedtime. 80 mL 2  . Insulin Syringe-Needle U-100 (BD INSULIN SYRINGE ULTRAFINE) 31G X 15/64" 1 ML MISC Use to inject insulin daily 100 each 1  . Lancet Devices (ACCU-CHEK SOFTCLIX) lancets Use as instructed for 4 times daily blood glucose monitoring 1 each 0  . losartan (COZAAR) 25 MG tablet Take 1 tablet (25 mg total) by mouth daily. 30 tablet 6  . meloxicam (MOBIC) 15 MG tablet TAKE ONE TABLET BY MOUTH ONCE DAILY .MUST  HAVE  OFFICE  VISIT  FOR  REFILLS 30 tablet 0  . methocarbamol (ROBAXIN) 500 MG tablet TAKE ONE TABLET BY MOUTH 4 TIMES DAILY 120 tablet 2  . metoprolol tartrate (LOPRESSOR) 50 MG tablet Take 1 tablet (50 mg total) by mouth 2 (two) times daily. 120 tablet 3  . Multiple Vitamins-Minerals (MULTIVITAMIN PO) Take 1 tablet by mouth daily.    . nitroGLYCERIN (NITROSTAT) 0.4 MG SL tablet Place 1 tablet (0.4 mg total) under the tongue every 5 (five) minutes as needed for chest pain. 25 tablet 12  . sodium chloride (OCEAN) 0.65 %  SOLN nasal spray Place 1 spray into both nostrils as needed for congestion. 1 Bottle 0  . Insulin Disposable Pump (V-GO 20) KIT Use 1 pod daily for insulin DX code- E11.40 (Patient not taking: Reported on 04/07/2017) 30 kit 12  . ticagrelor (BRILINTA) 90 MG TABS tablet Take 90 mg by mouth 2 (two) times daily.     No facility-administered medications prior to visit.      ROS Review of Systems  Constitutional: Negative for chills, fever and malaise/fatigue.  Eyes: Negative for blurred vision.  Respiratory: Negative for shortness of breath.   Cardiovascular: Positive for leg swelling. Negative for chest pain and palpitations.  Gastrointestinal: Negative for abdominal pain and  nausea.  Genitourinary: Negative for dysuria and hematuria.  Musculoskeletal: Positive for back pain. Negative for joint pain and myalgias.  Skin: Negative for rash.  Neurological: Negative for tingling and headaches.  Psychiatric/Behavioral: Negative for depression. The patient is not nervous/anxious.     Objective:  BP 130/81 (BP Location: Left Arm, Patient Position: Sitting, Cuff Size: Large)   Pulse 94   Temp 98 F (36.7 C) (Oral)   Ht 5' 7.5" (1.715 m)   Wt (!) 400 lb (181.4 kg)   SpO2 97%   BMI 61.72 kg/m   BP/Weight 04/13/2017 3/83/2919 12/26/6058  Systolic BP 045 997 741  Diastolic BP 81 88 92  Wt. (Lbs) 400 399.4 399  BMI 61.72 61.63 61.57      Physical Exam  Constitutional: She is oriented to person, place, and time.  Morbid obesity, NAD, polite  HENT:  Head: Normocephalic and atraumatic.  Eyes: No scleral icterus.  Neck: Normal range of motion. Neck supple. No thyromegaly present.  Cardiovascular: Normal rate, regular rhythm and normal heart sounds.   1+ pitting edema of the ankles and pretibial area bilaterally  Pulmonary/Chest: Effort normal and breath sounds normal.  Neurological: She is alert and oriented to person, place, and time. No cranial nerve deficit. Coordination normal.  Skin: Skin is warm and dry. No rash noted. No erythema. No pallor.  Psychiatric: She has a normal mood and affect. Her behavior is normal. Thought content normal.  Vitals reviewed.    Assessment & Plan:   1. Hypertension, unspecified type - hydrochlorothiazide (HYDRODIURIL) 25 MG tablet; Take 1 tablet (25 mg total) by mouth daily. Take on tablet in the morning.  Dispense: 90 tablet; Refill: 1 - Comprehensive metabolic panel  2. Lower extremity edema - hydrochlorothiazide (HYDRODIURIL) 25 MG tablet; Take 1 tablet (25 mg total) by mouth daily. Take on tablet in the morning.  Dispense: 90 tablet; Refill: 1  3. High risk medication use - Tartrazine allergy discussed with  patient. States she regularly eats and drinks foods with yellow food coloring. Also has pills that are colored yellow. Thinks there may be a mistake on the allergy list. Nevertheless, I asked her to take precaution and have Benadryl on hand in case of allergic reaction. She is also to call ambulance if in fact she experiences allergic reaction. - diphenhydrAMINE (BENADRYL) 25 MG tablet; Take 1 tablet (25 mg total) by mouth every 6 (six) hours as needed.  Dispense: 30 tablet; Refill: 0  4. Midline low back pain with left-sided sciatica, unspecified chronicity - traMADol (ULTRAM) 50 MG tablet; Take 1 tablet (50 mg total) by mouth every 8 (eight) hours as needed.  Dispense: 30 tablet; Refill: 0 - Ambulatory referral to Physical Therapy  5. Hyperglycemia - HgB A1c 9.3% in clinic today. - asked to return  to her endocrinologist to discuss further treatment of DM. She states she was supposed to receive an insulin pump and is currently in communication with her endocrinology office.  6. Morbid obesity ([place]) - TSH   Meds ordered this encounter  Medications  . hydrochlorothiazide (HYDRODIURIL) 25 MG tablet    Sig: Take 1 tablet (25 mg total) by mouth daily. Take on tablet in the morning.    Dispense:  90 tablet    Refill:  1    Order Specific Question:   Supervising Provider    Answer:   Tresa Garter [id number]  . diphenhydrAMINE (BENADRYL) 25 MG tablet    Sig: Take 1 tablet (25 mg total) by mouth every 6 (six) hours as needed.    Dispense:  30 tablet    Refill:  0    Order Specific Question:   Supervising Provider    Answer:   Tresa Garter [id number]  . traMADol (ULTRAM) 50 MG tablet    Sig: Take 1 tablet (50 mg total) by mouth every 8 (eight) hours as needed.    Dispense:  30 tablet    Refill:  0    Order Specific Question:   Supervising Provider    Answer:   Tresa Garter [[id number]]    Follow-up: 2-4 weeks for HTN f/u  Clent Demark PA

## 2017-04-14 ENCOUNTER — Encounter (HOSPITAL_COMMUNITY)
Admission: RE | Admit: 2017-04-14 | Discharge: 2017-04-14 | Disposition: A | Discharge status: Home / Self Care | Payer: Medicaid Other | Source: Ambulatory Visit

## 2017-04-14 LAB — COMPREHENSIVE METABOLIC PANEL
A/G RATIO: 1.3 (ref 1.2–2.2)
ALK PHOS: 97 IU/L (ref 39–117)
ALT: 18 IU/L (ref 0–32)
AST: 17 IU/L (ref 0–40)
Albumin: 4.1 g/dL (ref 3.5–5.5)
BILIRUBIN TOTAL: 0.2 mg/dL (ref 0.0–1.2)
BUN/Creatinine Ratio: 16 (ref 9–23)
BUN: 13 mg/dL (ref 6–20)
CALCIUM: 9.9 mg/dL (ref 8.7–10.2)
CHLORIDE: 103 mmol/L (ref 96–106)
CO2: 22 mmol/L (ref 18–29)
Creatinine, Ser: 0.83 mg/dL (ref 0.57–1.00)
GFR calc Af Amer: 106 mL/min/{1.73_m2} (ref >59)
GFR, EST NON AFRICAN AMERICAN: 92 mL/min/{1.73_m2} (ref >59)
GLOBULIN, TOTAL: 3.1 g/dL (ref 1.5–4.5)
Glucose: 167 mg/dL — ABNORMAL HIGH (ref 65–99)
POTASSIUM: 4.8 mmol/L (ref 3.5–5.2)
SODIUM: 142 mmol/L (ref 134–144)
Total Protein: 7.2 g/dL (ref 6.0–8.5)

## 2017-04-14 LAB — TSH: TSH: 3.85 u[IU]/mL (ref 0.450–4.500)
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
## 2017-04-16 ENCOUNTER — Encounter (HOSPITAL_COMMUNITY)
Admission: RE | Admit: 2017-04-16 | Discharge: 2017-04-16 | Disposition: A | Discharge status: Home / Self Care | Payer: Medicaid Other | Source: Ambulatory Visit | Attending: Interventional Cardiology | Admitting: Interventional Cardiology

## 2017-04-16 DIAGNOSIS — I214 Non-ST elevation (NSTEMI) myocardial infarction: Secondary | ICD-10-CM

## 2017-04-16 DIAGNOSIS — Z955 Presence of coronary angioplasty implant and graft: Secondary | ICD-10-CM | POA: Diagnosis not present

## 2017-04-16 LAB — GLUCOSE, CAPILLARY
Glucose-Capillary: 133 mg/dL — ABNORMAL HIGH (ref 65–99)
Glucose-Capillary: 145 mg/dL — ABNORMAL HIGH (ref 65–99)

## 2017-04-19 ENCOUNTER — Encounter (HOSPITAL_COMMUNITY): Payer: Medicaid Other

## 2017-04-21 ENCOUNTER — Encounter: Payer: Self-pay | Admitting: Endocrinology

## 2017-04-21 ENCOUNTER — Encounter (HOSPITAL_COMMUNITY)
Admission: RE | Admit: 2017-04-21 | Discharge: 2017-04-21 | Disposition: A | Discharge status: Home / Self Care | Payer: Medicaid Other | Source: Ambulatory Visit | Attending: Interventional Cardiology | Admitting: Interventional Cardiology

## 2017-04-21 ENCOUNTER — Ambulatory Visit (INDEPENDENT_AMBULATORY_CARE_PROVIDER_SITE_OTHER): Payer: Medicaid Other | Admitting: Endocrinology

## 2017-04-21 VITALS — BP 136/78 | HR 81 | Ht 67.5 in | Wt 393.6 lb

## 2017-04-21 VITALS — Ht 67.0 in | Wt 391.5 lb

## 2017-04-21 DIAGNOSIS — Z794 Long term (current) use of insulin: Secondary | ICD-10-CM | POA: Diagnosis not present

## 2017-04-21 DIAGNOSIS — Z955 Presence of coronary angioplasty implant and graft: Secondary | ICD-10-CM | POA: Diagnosis present

## 2017-04-21 DIAGNOSIS — I214 Non-ST elevation (NSTEMI) myocardial infarction: Secondary | ICD-10-CM

## 2017-04-21 DIAGNOSIS — E1165 Type 2 diabetes mellitus with hyperglycemia: Secondary | ICD-10-CM | POA: Diagnosis not present

## 2017-04-21 DIAGNOSIS — I219 Acute myocardial infarction, unspecified: Secondary | ICD-10-CM | POA: Diagnosis not present

## 2017-04-21 LAB — GLUCOSE, CAPILLARY
Glucose-Capillary: 140 mg/dL — ABNORMAL HIGH (ref 65–99)
Glucose-Capillary: 117 mg/dL — ABNORMAL HIGH (ref 65–99)

## 2017-04-21 LAB — GLUCOSE, POCT (MANUAL RESULT ENTRY): POC Glucose: 196 mg/dl — AB (ref 70–99)

## 2017-04-21 MED ORDER — EXENATIDE 5 MCG/0.02ML ~~LOC~~ SOPN: 5.0000 ug | PEN_INJECTOR | Freq: Two times a day (BID) | SUBCUTANEOUS | 2 refills | Status: DC

## 2017-04-21 NOTE — Progress Notes (Signed)
Patient ID: Lindsay [name], female   DOB: [birth year], 36 y.o.   MRN: [id number]            Reason for Appointment: Type II Diabetes, follow-up    History of Present Illness   Diagnosis date: 2004  Previous history:  On insulin since about 2008 No prior records are available  Recent history:  Insulin regimen: Lantus 30 bid, HUMULIN R U-500 with syringe: 5-10 ac and 5 at bedtime  Her A1c is 9.3, previously 12.4  Current management, blood sugar patterns and problems identified:  She has not brought her monitor for download today  She is now on the U-500 insulin with a syringe instead of the pen because of lack of insurance coverage  She was told to start Byetta on her last with because of poor compliance with diet and postprandial hyperglycemia with blood sugars over 400 at times  However she claimed that she did not get the prescription he 1 that was sent in to her pharmacy  Also she was told to start the V-go pump which she got from [employer] but she does not want to do this because her package got misplaced outside her home  She claims that her blood sugars are fairly good all the time with sporadic high readings based on her diet as below  she thinks she is having difficulty controlling her portions and snacks in the evening and eating late night snacks like cookies  She has no side effects from starting JARDIANCE 10 mg daily and has lost weight  Most of her fasting blood sugars are just over 100 reportedly  Glucose today was 196, was high as morning at home also because of eating a large snack the night before     Oral hypoglycemic drugs:        Side effects from medications: None        Proper timing of medications in relation to meals: Yes.          Monitors blood glucose: Once or twice a day.    Glucometer:  Accu-Chek  Blood Glucose readings from recall:  Mean values apply above for all meters except median for One Touch  PRE-MEAL Fasting  Lunch Dinner Bedtime Overall  Glucose range: 67-240 125  125-225   Mean/median:           Hypoglycemia:  none         Meals: 3 meals per day. Drinks water, Avoiding drinks with sugar Does not have a specific meal plan          Physical activity: exercise: walking rehab            Dietician visit: Most recent: 2/18     Weight control:  Wt Readings from Last 3 Encounters:  04/21/17 (!) 393 lb 9.6 oz (178.5 kg)  04/13/17 (!) 400 lb (181.4 kg)  04/07/17 (!) 399 lb 6.4 oz (181.2 kg)           Diabetes labs:  Lab Results  Component Value Date   HGBA1C 9.3 04/13/2017   HGBA1C 12.5 01/18/2017   HGBA1C 12.4 (H) 09/08/2016   Lab Results  Component Value Date   MICROALBUR 9.6 (H) 02/18/2017   LDLCALC 54 10/28/2016   CREATININE 0.83 04/13/2017     Allergies as of 04/21/2017      Reactions   Hydrazine Yellow [tartrazine] Shortness Of Breath, Swelling   Swelling mostly noticed in legs and feet, retaining urination, shortness of breaht, and  minor chest pain   Lisinopril Shortness Of Breath   Was on prinzide; had sob/chest pain on it.   Tylenol [acetaminophen] Itching, Swelling   Itching of the mouth, swelling of tongue and stomach started hurting      Medication List       Accurate as of 04/21/17  3:45 PM. Always use your most recent med list.          ACCU-CHEK AVIVA PLUS w/Device Kit Use as directed for 4 times daily blood glucose monitoring   accu-chek softclix lancets Use as instructed for 4 times daily blood glucose monitoring   ACCU-CHEK SOFTCLIX LANCETS lancets Use as instructed for 4 times daily blood glucose monitoring   albuterol 108 (90 Base) MCG/ACT inhaler Commonly known as:  PROVENTIL HFA;VENTOLIN HFA Inhale 2 puffs into the lungs every 6 (six) hours as needed for wheezing or shortness of breath.   amLODipine 5 MG tablet Commonly known as:  NORVASC Take 5 mg by mouth daily.   aspirin 81 MG tablet Take 81 mg by mouth daily.   atorvastatin 80 MG  tablet Commonly known as:  LIPITOR Take 1 tablet (80 mg total) by mouth daily at 6 PM.   diphenhydrAMINE 25 MG tablet Commonly known as:  BENADRYL Take 1 tablet (25 mg total) by mouth every 6 (six) hours as needed.   empagliflozin 10 MG Tabs tablet Commonly known as:  JARDIANCE Take 10 mg by mouth daily with breakfast.   exenatide 5 MCG/0.02ML Sopn injection Commonly known as:  BYETTA 5 MCG PEN Inject 0.02 mLs (5 mcg total) into the skin 2 (two) times daily with a meal.   gabapentin 300 MG capsule Commonly known as:  NEURONTIN Take 2 capsules (600 mg total) by mouth 4 (four) times daily.   glucose blood test strip Commonly known as:  ACCU-CHEK AVIVA PLUS Use as instructed for 4 times daily blood glucose monitoring   hydrochlorothiazide 25 MG tablet Commonly known as:  HYDRODIURIL Take 1 tablet (25 mg total) by mouth daily. Take on tablet in the morning.   Insulin Glargine 100 UNIT/ML Solostar Pen Commonly known as:  LANTUS SOLOSTAR Inject 60 units at bedtime.   Insulin Pen Needle 31G X 5 MM Misc 1 application by Does not apply route Nightly.   insulin regular human CONCENTRATED 500 UNIT/ML injection Commonly known as:  HUMULIN R 70 units 30 minutes before each meal; 50 at bedtime.   Insulin Syringe-Needle U-100 31G X 15/64" 1 ML Misc Commonly known as:  BD INSULIN SYRINGE ULTRAFINE Use to inject insulin daily   losartan 25 MG tablet Commonly known as:  COZAAR Take 1 tablet (25 mg total) by mouth daily.   meloxicam 15 MG tablet Commonly known as:  MOBIC TAKE ONE TABLET BY MOUTH ONCE DAILY .MUST  HAVE  OFFICE  VISIT  FOR  REFILLS   methocarbamol 500 MG tablet Commonly known as:  ROBAXIN TAKE ONE TABLET BY MOUTH 4 TIMES DAILY   metoprolol 50 MG tablet Commonly known as:  LOPRESSOR Take 1 tablet (50 mg total) by mouth 2 (two) times daily.   MULTIVITAMIN PO Take 1 tablet by mouth daily.   nitroGLYCERIN 0.4 MG SL tablet Commonly known as:  NITROSTAT Place 1  tablet (0.4 mg total) under the tongue every 5 (five) minutes as needed for chest pain.   sodium chloride 0.65 % Soln nasal spray Commonly known as:  OCEAN Place 1 spray into both nostrils as needed for congestion.   ticagrelor 90  MG Tabs tablet Commonly known as:  BRILINTA Take 90 mg by mouth 2 (two) times daily.   traMADol 50 MG tablet Commonly known as:  ULTRAM Take 1 tablet (50 mg total) by mouth every 8 (eight) hours as needed.   V-GO 20 Kit Use 1 pod daily for insulin DX code- E11.40       Allergies:  Allergies  Allergen Reactions  . Hydrazine Yellow [Tartrazine] Shortness Of Breath and Swelling    Swelling mostly noticed in legs and feet, retaining urination, shortness of breaht, and minor chest pain  . Lisinopril Shortness Of Breath    Was on prinzide; had sob/chest pain on it.  . Tylenol [Acetaminophen] Itching and Swelling    Itching of the mouth, swelling of tongue and stomach started hurting    Past Medical History:  Diagnosis Date  . ARF (acute renal failure) (HCC) 04/2015  . Asthma   . Cellulitis of right upper extremity   . Coronary artery disease   . Diabetes mellitus    insulin dependent  . Hyperlipidemia LDL goal <70   . Hypertension   . NSTEMI (non-ST elevated myocardial infarction) (HCC) 08/2016  . Obesity   . S/P angioplasty with stent 08/2016   DES to mLAD and PTCA only to 2nd diag ostium.     Past Surgical History:  Procedure Laterality Date  . CARDIAC CATHETERIZATION N/A 09/07/2016   Procedure: Left Heart Cath and Coronary Angiography;  Surgeon: Lyn Records, MD;  Location[facility] INVASIVE CV LAB;  Service: Cardiovascular;  Laterality: N/A;  . CARDIAC CATHETERIZATION N/A 09/07/2016   Procedure: Coronary Stent Intervention;  Surgeon: Lyn Records, MD;  Location: [facility] INVASIVE CV LAB;  Service: Cardiovascular;  Laterality: N/A;  . CARDIAC CATHETERIZATION N/A 09/07/2016   Procedure: Coronary Balloon Angioplasty;  Surgeon: Lyn Records, MD;   Location: [facility] INVASIVE CV LAB;  Service: Cardiovascular;  Laterality: N/A;  . CESAREAN SECTION    . IRRIGATION AND DEBRIDEMENT SHOULDER Right 04/30/2015   Procedure: IRRIGATION AND DEBRIDEMENT SHOULDER;  Surgeon: Sheral Apley, MD;  Location: [facility] OR;  Service: Orthopedics;  Laterality: Right;  . IRRIGATION AND DEBRIDEMENT SHOULDER Right 05/01/2015  . LEG SURGERY    . SHOULDER ARTHROSCOPY Right 04/30/2015   Procedure: ARTHROSCOPY SHOULDER;  Surgeon: Sheral Apley, MD;  Location: [facility] OR;  Service: Orthopedics;  Laterality: Right;  . TONSILLECTOMY      Family History  Problem Relation Age of Onset  . Diabetes Mother   . Hypertension Mother   . Heart disease Mother   . Diabetes Father   . Heart disease Father   . Stroke Maternal Grandmother   . Cancer Maternal Grandmother     Social History:  reports that she has never smoked. She has never used smokeless tobacco. She reports that she drinks alcohol. She reports that she does not use drugs.  Review of Systems:  Hypertension:  She is on  Lopressor And amlodipine, followed by cardiologist   BP Readings from Last 3 Encounters:  04/21/17 136/78  04/13/17 130/81  04/07/17 130/88     Lipids: Has been treated with Lipitor 80 mg because of history of CAD  Lab Results  Component Value Date   CHOL 124 10/28/2016   HDL 35 (L) 10/28/2016   LDLCALC 54 10/28/2016   TRIG 174 (H) 10/28/2016   CHOLHDL 3.5 10/28/2016      LABS:  Hospital Outpatient Visit on 04/21/2017  Component Date Value Ref Range Status  . Glucose-Capillary 04/21/2017  117* 65 - 99 mg/dL Final  Office Visit on 04/21/2017  Component Date Value Ref Range Status  . POC Glucose 04/21/2017 196* 70 - 99 mg/dl Final  Hospital Outpatient Visit on 04/16/2017  Component Date Value Ref Range Status  . Glucose-Capillary 04/16/2017 145* 65 - 99 mg/dL Final  . Glucose-Capillary 04/16/2017 133* 65 - 99 mg/dL Final     Examination:   BP 136/78   Pulse 81   Ht 5' 7.5"  (1.715 m)   Wt (!) 393 lb 9.6 oz (178.5 kg)   SpO2 97%   BMI 60.74 kg/m   Body mass index is 60.74 kg/m.    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  To start Byetta 5 g before breakfast first and then after 1 week if tolerated well start taking this before supper also  Again reminded her that this will need to be taken 30-60 minutes before eating  This may cause nausea  She need to check her blood sugars more consistently at various times of the day and bring monitor consistently for download  She may need at least 30-40% reduction in her insulin dose when taking Byetta simultaneously but given guidelines for targeting the postprandial reading under 160  She will try to be more consistent with diet and hopefully Byetta will help control her carbohydrate cravings  Most likely no need to change her basal insulin dosage as yet unless fasting readings are not consistent  Most likely will not get coverage for [place] or [place]    Patient Instructions  Byetta 30 min before Bfst and after 1 week 2x daily before eating  With Byetta take only 6-8 units of R insulin  Check blood sugars on waking up  4x weekly  Also check blood sugars about 2 hours after a meal and do this after different meals by rotation  Recommended blood sugar levels on waking up is 90-130 and about 2 hours after meal is 130-160  Please bring your blood sugar monitor to each visit, thank  you     Counseling time on subjects discussed above is over 50% of today's 25 minute visit   Janiesha Diehl 04/21/2017, 3:45 PM

## 2017-04-21 NOTE — Patient Instructions (Addendum)
Byetta 30 min before Bfst and after 1 week 2x daily before eating  With Byetta take only 6-8 units of R insulin  Check blood sugars on waking up  4x weekly  Also check blood sugars about 2 hours after a meal and do this after different meals by rotation  Recommended blood sugar levels on waking up is 90-130 and about 2 hours after meal is 130-160  Please bring your blood sugar monitor to each visit, thank you

## 2017-04-22 ENCOUNTER — Ambulatory Visit: Payer: Medicaid Other | Attending: Physician Assistant | Admitting: Physical Therapy

## 2017-04-22 ENCOUNTER — Encounter: Payer: Self-pay | Admitting: Physical Therapy

## 2017-04-22 DIAGNOSIS — R293 Abnormal posture: Secondary | ICD-10-CM | POA: Diagnosis present

## 2017-04-22 DIAGNOSIS — R262 Difficulty in walking, not elsewhere classified: Secondary | ICD-10-CM | POA: Insufficient documentation

## 2017-04-22 DIAGNOSIS — M5441 Lumbago with sciatica, right side: Secondary | ICD-10-CM | POA: Diagnosis not present

## 2017-04-22 DIAGNOSIS — R531 Weakness: Secondary | ICD-10-CM | POA: Diagnosis present

## 2017-04-22 DIAGNOSIS — G8929 Other chronic pain: Secondary | ICD-10-CM | POA: Insufficient documentation

## 2017-04-22 NOTE — Therapy (Signed)
[facility] Outpatient Rehabilitation [facility] [address] [place], [place], [address] Phone: [phone]   Fax:  [phone]  Physical Therapy Evaluation  Discharge Summary  Patient Details  Name: Lindsay [name] MRN: [id number] Date of Birth: Jul 12, 1981 Referring Provider: Loletta Specter, [place]  Encounter Date: 04/22/2017      PT End of Session - 04/22/17 0941    Visit Number 1   Number of Visits 1   Authorization Type Medicaid   PT Start Time 0925   PT Stop Time 1020   PT Time Calculation (min) 55 min   Activity Tolerance Patient tolerated treatment well   Behavior During Therapy [facility] for tasks assessed/performed      Past Medical History:  Diagnosis Date  . ARF (acute renal failure) (HCC) 04/2015  . Asthma   . Cellulitis of right upper extremity   . Coronary artery disease   . Diabetes mellitus    insulin dependent  . Hyperlipidemia LDL goal <70   . Hypertension   . NSTEMI (non-ST elevated myocardial infarction) (HCC) 08/2016  . Obesity   . S/P angioplasty with stent 08/2016   DES to mLAD and PTCA only to 2nd diag ostium.     Past Surgical History:  Procedure Laterality Date  . CARDIAC CATHETERIZATION N/A 09/07/2016   Procedure: Left Heart Cath and Coronary Angiography;  Surgeon: Lyn Records, MD;  Location: [facility] INVASIVE CV LAB;  Service: Cardiovascular;  Laterality: N/A;  . CARDIAC CATHETERIZATION N/A 09/07/2016   Procedure: Coronary Stent Intervention;  Surgeon: Lyn Records, MD;  Location: [facility] INVASIVE CV LAB;  Service: Cardiovascular;  Laterality: N/A;  . CARDIAC CATHETERIZATION N/A 09/07/2016   Procedure: Coronary Balloon Angioplasty;  Surgeon: Lyn Records, MD;  Location: [facility] INVASIVE CV LAB;  Service: Cardiovascular;  Laterality: N/A;  . CESAREAN SECTION    . IRRIGATION AND DEBRIDEMENT SHOULDER Right 04/30/2015   Procedure: IRRIGATION AND DEBRIDEMENT SHOULDER;  Surgeon: Sheral Apley, MD;  Location: [facility] OR;  Service: Orthopedics;   Laterality: Right;  . IRRIGATION AND DEBRIDEMENT SHOULDER Right 05/01/2015  . LEG SURGERY    . SHOULDER ARTHROSCOPY Right 04/30/2015   Procedure: ARTHROSCOPY SHOULDER;  Surgeon: Sheral Apley, MD;  Location: [facility] OR;  Service: Orthopedics;  Laterality: Right;  . TONSILLECTOMY      There were no vitals filed for this visit.       Subjective Assessment - 04/22/17 0924    Subjective Pt arriving to therapy as a medicaid evaluation (limited to one visit due to financial reasons). Pt reporting midline low back pain which is worse on the R side when standing. Pt reporting pain worsed around February. Pt reporting difficulty with bathing and dressing. Pt reporting difficulty standing for long periods. Pt unable to reach to the ground without increased pain. Pt reporting pain today of 7/10. Pt reporting taking a pain pill prior to therapy. Pt reporting 10/10 this morning when she woke up.    Limitations Walking;House hold activities;Standing;Lifting   How long can you sit comfortably? unlimited   How long can you stand comfortably? 5 minutes   How long can you walk comfortably? less than 5 minutes   Patient Stated Goals X-ray   Currently in Pain? Yes   Pain Score 7    Pain Location Back   Pain Orientation Lower;Medial   Pain Descriptors / Indicators Aching  depends on the activities   Pain Type Chronic pain   Pain Onset More than a month ago  Pain Frequency Constant   Aggravating Factors  bending, standing, twisting, lifting, walking   Pain Relieving Factors pain meds, heat   Effect of Pain on Daily Activities difficulties with ADL's, standing, walking, bathing, household activities            [facility] PT Assessment - 04/22/17 0001      Assessment   Medical Diagnosis low back pain   Referring Provider Loletta Specter, [place]   Hand Dominance Right   Next MD Visit follow up   Prior Therapy yes, for shoulder years ago     Precautions   Precautions None     Restrictions   Weight  Bearing Restrictions No     Balance Screen   Has the patient fallen in the past 6 months No   Is the patient reluctant to leave their home because of a fear of falling?  No     Home Environment   Living Environment Private residence   Living Arrangements Children  25 year old   Available Help at Discharge Family   Type of Home Apartment   Home Access Stairs to enter   Entrance Stairs-Number of Steps 2   Entrance Stairs-Rails None   Home Layout One level     Prior Function   Level of Independence Independent with basic ADLs   Vocation On disability   Leisure "use to enjoy bowling", but I can't do that now. I like to go to the movies and go for walks with my daughter.      Cognition   Overall Cognitive Status Within Functional Limits for tasks assessed     Posture/Postural Control   Posture/Postural Control Postural limitations   Postural Limitations Rounded Shoulders;Forward head;Increased lumbar lordosis;Anterior pelvic tilt     ROM / Strength   AROM / PROM / Strength AROM;Strength     AROM   AROM Assessment Site Lumbar   Lumbar Flexion 40   Lumbar Extension 25   Lumbar - Right Side Bend 20   Lumbar - Left Side Bend 30     Strength   Overall Strength Deficits   Overall Strength Comments bilateral knee flexion and extension grossly 5/5, bilateral df and pf grossly 5/5   Strength Assessment Site Hip   Right/Left Hip Right;Left   Right Hip Flexion 3+/5   Right Hip Extension 3+/5   Right Hip ABduction 4-/5   Right Hip ADduction 4-/5   Left Hip Flexion 4/5   Left Hip Extension 4/5   Left Hip ABduction 4/5   Left Hip ADduction 4/5     Palpation   Palpation comment Pt with tenderness over lumbar paraspinals and sacral     Special Tests    Special Tests Lumbar   Lumbar Tests Straight Leg Raise     Straight Leg Raise   Findings Negative   Side  Right   Comment Negative on left     Transfers   Transfers Sit to Stand   Comments Pt requring increased time for all  transfers and mobility.     Ambulation/Gait   Ambulation/Gait Yes   Ambulation/Gait Assistance 6: Modified independent (Device/Increase time)   Ambulation Distance (Feet) 50 Feet   Assistive device None   Gait Pattern Step-through pattern;Wide base of support                           PT Education - 04/22/17 1029    Education provided Yes   Education Details  posture, HEP and progression, instructed pt how to purchase a TENS unit on line to help with pain   Person(s) Educated Patient   Methods Explanation;Demonstration;Tactile cues;Handout;Verbal cues   Comprehension Verbalized understanding;Returned demonstration          PT Short Term Goals - 04/22/17 1034      PT SHORT TERM GOAL #1   Title "Independent with initial HEP   Baseline does independently   Time 1   Period Days   Status Achieved                  Plan - 04/22/17 1029    Clinical Impression Statement Patient arriving to therapy as a low complexity evalaution complaining of 7/10 low back pain which is worse in the morning. Pt was edu in posture and HEP for core strengthening and streching. Pt able to demonstrate independence with each exercise. Pt also presenting with limited ROM in her lumbar spine. Due to financial restraints pt only able to receive evalaution from Physical Therapy.    Rehab Potential Excellent   Clinical Impairments Affecting Rehab Potential MI 09/06/16, pt finishing up Cardiac Rehab on Monday, DM,   PT Frequency One time visit   PT Treatment/Interventions Therapeutic exercise;Passive range of motion   PT Home Exercise Plan HEP, posture correction   Recommended Other Services Recommend purchase of TENS unit to help with back pain on-line   Consulted and Agree with Plan of Care Patient      Patient will benefit from skilled therapeutic intervention in order to improve the following deficits and impairments:  Postural dysfunction, Decreased strength, Decreased  mobility, Pain, Difficulty walking, Decreased range of motion  Visit Diagnosis: Chronic midline low back pain with right-sided sciatica  Abnormal posture  Generalized weakness  Difficulty in walking, not elsewhere classified     Problem List Patient Active Problem List   Diagnosis Date Noted  . OSA (obstructive sleep apnea) 11/03/2016  . CAD in native artery 09/11/2016  . Diabetes mellitus with complication (HCC)   . Pain in the chest 09/07/2016  . Community acquired pneumonia 09/07/2016  . Elevated troponin 09/07/2016  . MI, old   . DM neuropathy, type II diabetes mellitus (HCC) 06/10/2016  . Morbid obesity (HCC) 06/10/2016  . Pressure ulcer 05/17/2015  . Weakness 05/16/2015  . Generalized weakness 05/15/2015  . Anaerobic abscess (HCC)   . Cellulitis of right upper arm   . ARF (acute renal failure) (HCC)   . Septic arthritis (HCC)   . Essential hypertension 04/30/2015  . Cellulitis 04/30/2015  . Hyponatremia 04/30/2015  . Abscess   . Abscess of right shoulder   . Diabetes mellitus due to underlying condition without complications (HCC) 07/16/2014  . Obesity 07/16/2014  . Cellulitis of left upper extremity 07/16/2014    Sharmon Leyden, MPT 04/22/2017, 10:42 AM  [facility] [address] [place], [place], [address] Phone: [phone]   Fax:  [phone]  Name: Lindsay [name] MRN: [id number] Date of Birth: [birth year]

## 2017-04-22 NOTE — Patient Instructions (Addendum)
   Posterior Pelvic Tilt: tilt bottom of pelvis upward, flattening out your low back and holding 5 seconds Repeat 10-15 times 2 times a day     Hamstring Stretch:  Hold 30 seconds Repeat 3 times on each leg 2 times a day    Lift bottom off bed squeezing a towel roll/ball between the knees, holding 5 seconds Repeat 10-15 times 2 times a day     Lye on your back lifting one foot at a time, holding that posterior pelvic tilt 20 times 2 times a day    Rotate legs to each side holding 10 seconds each  Repeat 3 times on each side 2 times a day

## 2017-04-23 ENCOUNTER — Encounter (HOSPITAL_COMMUNITY): Payer: Medicaid Other

## 2017-04-26 ENCOUNTER — Encounter (HOSPITAL_COMMUNITY)
Admission: RE | Admit: 2017-04-26 | Discharge: 2017-04-26 | Disposition: A | Discharge status: Home / Self Care | Payer: Medicaid Other | Source: Ambulatory Visit

## 2017-04-27 ENCOUNTER — Ambulatory Visit (INDEPENDENT_AMBULATORY_CARE_PROVIDER_SITE_OTHER): Payer: Medicaid Other | Admitting: Physician Assistant

## 2017-04-27 ENCOUNTER — Encounter (INDEPENDENT_AMBULATORY_CARE_PROVIDER_SITE_OTHER): Payer: Self-pay | Admitting: Physician Assistant

## 2017-04-27 VITALS — BP 113/75 | HR 82 | Temp 98.1°F | Wt 395.0 lb

## 2017-04-27 DIAGNOSIS — M5442 Lumbago with sciatica, left side: Secondary | ICD-10-CM | POA: Diagnosis not present

## 2017-04-27 DIAGNOSIS — I10 Essential (primary) hypertension: Secondary | ICD-10-CM | POA: Diagnosis not present

## 2017-04-27 DIAGNOSIS — G8929 Other chronic pain: Secondary | ICD-10-CM

## 2017-04-27 MED ORDER — METHOCARBAMOL 500 MG PO TABS: 500.0000 mg | ORAL_TABLET | Freq: Every day | ORAL | 1 refills | Status: DC

## 2017-04-27 MED ORDER — LOSARTAN POTASSIUM 25 MG PO TABS: 25.0000 mg | ORAL_TABLET | Freq: Every day | ORAL | 6 refills | Status: DC

## 2017-04-27 MED ORDER — AMLODIPINE BESYLATE 5 MG PO TABS: 5.0000 mg | ORAL_TABLET | Freq: Every day | ORAL | 1 refills | Status: DC

## 2017-04-27 MED ORDER — MELOXICAM 15 MG PO TABS: ORAL_TABLET | ORAL | 1 refills | Status: DC

## 2017-04-27 MED ORDER — HYDROCHLOROTHIAZIDE 25 MG PO TABS: 25.0000 mg | ORAL_TABLET | Freq: Every day | ORAL | 1 refills | Status: DC

## 2017-04-27 MED ORDER — METOPROLOL TARTRATE 50 MG PO TABS: 50.0000 mg | ORAL_TABLET | Freq: Two times a day (BID) | ORAL | 1 refills | Status: DC

## 2017-04-27 MED ORDER — TRAMADOL HCL 50 MG PO TABS: 50.0000 mg | ORAL_TABLET | Freq: Three times a day (TID) | ORAL | 0 refills | Status: DC | PRN

## 2017-04-27 NOTE — Patient Instructions (Signed)
Managing Your Hypertension Hypertension is commonly called high blood pressure. This is when the force of your blood pressing against the walls of your arteries is too strong. Arteries are blood vessels that carry blood from your heart throughout your body. Hypertension forces the heart to work harder to pump blood, and may cause the arteries to become narrow or stiff. Having untreated or uncontrolled hypertension can cause heart attack, stroke, kidney disease, and other problems. What are blood pressure readings? A blood pressure reading consists of a higher number over a lower number. Ideally, your blood pressure should be below 120/80. The first ("top") number is called the systolic pressure. It is a measure of the pressure in your arteries as your heart beats. The second ("bottom") number is called the diastolic pressure. It is a measure of the pressure in your arteries as the heart relaxes. What does my blood pressure reading mean? Blood pressure is classified into four stages. Based on your blood pressure reading, your health care provider may use the following stages to determine what type of treatment you need, if any. Systolic pressure and diastolic pressure are measured in a unit called mm Hg. Normal   Systolic pressure: below 120.  Diastolic pressure: below 80. Elevated   Systolic pressure: 120-129.  Diastolic pressure: below 80. Hypertension stage 1     Diastolic pressure: 80-89. Hypertension stage 2   Systolic pressure: 140 or above.  Diastolic pressure: 90 or above. What health risks are associated with hypertension? Managing your hypertension is an important responsibility. Uncontrolled hypertension can lead to:  A heart attack.  A stroke.  A weakened blood vessel (aneurysm).  Heart failure.  Kidney damage.  Eye damage.  Metabolic syndrome.  Memory and concentration problems. What changes can I make to manage my hypertension? Eating and drinking   Eat a  diet that is high in fiber and potassium, and low in salt (sodium), added sugar, and fat. An example eating plan is called the DASH (Dietary Approaches to Stop Hypertension) diet. To eat this way:  Eat plenty of fresh fruits and vegetables. Try to fill half of your plate at each meal with fruits and vegetables.  Eat whole grains, such as whole wheat pasta, brown rice, or whole grain bread. Fill about one quarter of your plate with whole grains.  Eat low-fat diary products.  Avoid fatty cuts of meat, processed or cured meats, and poultry with skin. Fill about one quarter of your plate with lean proteins such as fish, chicken without skin, beans, eggs, and tofu.  Avoid premade and processed foods. These tend to be higher in sodium, added sugar, and fat.     Lifestyle   Work with your health care provider to maintain a healthy body weight, or to lose weight. Ask what an ideal weight is for you.  Get at least 30 minutes of exercise that causes your heart to beat faster (aerobic exercise) most days of the week. Activities may include walking, swimming, or biking.       Monitoring   Monitor your blood pressure at home as told by your health care provider. Your personal target blood pressure may vary depending on your medical conditions, your age, and other factors.  Have your blood pressure checked regularly, as often as told by your health care provider. Working with your health care provider   Review all the medicines you take with your health care provider because there may be side effects or interactions.  Talk with your health   care provider about your diet, exercise habits, and other lifestyle factors that may be contributing to hypertension.  Visit your health care provider regularly. Your health care provider can help you create and adjust your plan for managing hypertension. Will I need medicine to control my blood pressure? Your health care provider may prescribe medicine if  lifestyle changes are not enough to get your blood pressure under control, and if:  Your systolic blood pressure is 130 or higher.  Your diastolic blood pressure is 80 or higher. Take medicines only as told by your health care provider. Follow the directions carefully. Blood pressure medicines must be taken as prescribed. The medicine does not work as well when you skip doses. Skipping doses also puts you at risk for problems. Contact a health care provider if:  You think you are having a reaction to medicines you have taken.  You have repeated (recurrent) headaches.  You feel dizzy.  You have swelling in your ankles.  You have trouble with your vision. Get help right away if:  You develop a severe headache or confusion.  You have unusual weakness or numbness, or you feel faint.  You have severe pain in your chest or abdomen.  You vomit repeatedly.  You have trouble breathing. Summary  Hypertension is when the force of blood pumping through your arteries is too strong. If this condition is not controlled, it may put you at risk for serious complications.  Your personal target blood pressure may vary depending on your medical conditions, your age, and other factors. For most people, a normal blood pressure is less than 120/80.  Hypertension is managed by lifestyle changes, medicines, or both. Lifestyle changes include weight loss, eating a healthy, low-sodium diet, exercising more, and limiting alcohol. This information is not intended to replace advice given to you by your health care provider. Make sure you discuss any questions you have with your health care provider. Document Released: 08/31/2012 Document Revised: 11/04/2016 Document Reviewed: 11/04/2016 [employer] Interactive Patient Education  2017 [employer]  

## 2017-04-27 NOTE — Progress Notes (Signed)
Subjective:  Patient ID: Lindsay [name], female    DOB: 05-25-1981  Age: 36 y.o. MRN: [id number]  CC: f/u HTN  HPI Lindsay [name] is a 36 y.o. female with a PMH of multiple comorbidities presents for f/u of HTN. BP is well controlled on current regimen. LE edema has also dramatically decreased. Also reports that she has finished her last session of cardiac rehabilitation. Main complaint today is of LBP w/sciatica. Has only been to one session of PT due to Medicaid only covering for one session. She has been doing at home exercises with little to moderate relief of back pain. Has taken Tramadol as needed with moderate relief of pain. Requests refill of Meloxicam and Robaxin also. Patient is opposed to surgical correction and therefore does not want to see orthopedics.     Outpatient Medications Prior to Visit  Medication Sig Dispense Refill  . ACCU-CHEK SOFTCLIX LANCETS lancets Use as instructed for 4 times daily blood glucose monitoring 100 each 12  . albuterol (PROVENTIL HFA;VENTOLIN HFA) 108 (90 Base) MCG/ACT inhaler Inhale 2 puffs into the lungs every 6 (six) hours as needed for wheezing or shortness of breath. 1 Inhaler 2  . aspirin 81 MG tablet Take 81 mg by mouth daily.    Marland Kitchen atorvastatin (LIPITOR) 80 MG tablet Take 1 tablet (80 mg total) by mouth daily at 6 PM. 30 tablet 6  . Blood Glucose Monitoring Suppl (ACCU-CHEK AVIVA PLUS) w/Device KIT Use as directed for 4 times daily blood glucose monitoring 1 kit 0  . diphenhydrAMINE (BENADRYL) 25 MG tablet Take 1 tablet (25 mg total) by mouth every 6 (six) hours as needed. 30 tablet 0  . empagliflozin (JARDIANCE) 10 MG TABS tablet Take 10 mg by mouth daily with breakfast. 30 tablet 3  . exenatide (BYETTA 5 MCG PEN) 5 MCG/0.02ML SOPN injection Inject 0.02 mLs (5 mcg total) into the skin 2 (two) times daily with a meal. 1 pen 2  . gabapentin (NEURONTIN) 300 MG capsule Take 2 capsules (600 mg total) by mouth 4 (four) times daily. 240 capsule 3   . glucose blood (ACCU-CHEK AVIVA PLUS) test strip Use as instructed for 4 times daily blood glucose monitoring 100 each 12  . Insulin Disposable Pump (V-GO 20) KIT Use 1 pod daily for insulin DX code- E11.40 (Patient not taking: Reported on 04/07/2017) 30 kit 12  . Insulin Glargine (LANTUS SOLOSTAR) 100 UNIT/ML Solostar Pen Inject 60 units at bedtime. (Patient taking differently: Inject 30 in the morning and 30 units at bedtime.) 10 pen 1  . Insulin Pen Needle 31G X 5 MM MISC 1 application by Does not apply route Nightly. 1000 each 3  . insulin regular human CONCENTRATED (HUMULIN R) 500 UNIT/ML injection 70 units 30 minutes before each meal; 50 at bedtime. 80 mL 2  . Insulin Syringe-Needle U-100 (BD INSULIN SYRINGE ULTRAFINE) 31G X 15/64" 1 ML MISC Use to inject insulin daily 100 each 1  . Lancet Devices (ACCU-CHEK SOFTCLIX) lancets Use as instructed for 4 times daily blood glucose monitoring 1 each 0  . Multiple Vitamins-Minerals (MULTIVITAMIN PO) Take 1 tablet by mouth daily.    . nitroGLYCERIN (NITROSTAT) 0.4 MG SL tablet Place 1 tablet (0.4 mg total) under the tongue every 5 (five) minutes as needed for chest pain. 25 tablet 12  . sodium chloride (OCEAN) 0.65 % SOLN nasal spray Place 1 spray into both nostrils as needed for congestion. 1 Bottle 0  . ticagrelor (BRILINTA) 90 MG TABS tablet  Take 90 mg by mouth 2 (two) times daily.    Marland Kitchen amLODipine (NORVASC) 5 MG tablet Take 5 mg by mouth daily.    . hydrochlorothiazide (HYDRODIURIL) 25 MG tablet Take 1 tablet (25 mg total) by mouth daily. Take on tablet in the morning. 90 tablet 1  . losartan (COZAAR) 25 MG tablet Take 1 tablet (25 mg total) by mouth daily. 30 tablet 6  . meloxicam (MOBIC) 15 MG tablet TAKE ONE TABLET BY MOUTH ONCE DAILY .MUST  HAVE  OFFICE  VISIT  FOR  REFILLS 30 tablet 0  . methocarbamol (ROBAXIN) 500 MG tablet TAKE ONE TABLET BY MOUTH 4 TIMES DAILY 120 tablet 2  . metoprolol tartrate (LOPRESSOR) 50 MG tablet Take 1 tablet (50  mg total) by mouth 2 (two) times daily. 120 tablet 3  . traMADol (ULTRAM) 50 MG tablet Take 1 tablet (50 mg total) by mouth every 8 (eight) hours as needed. 30 tablet 0   No facility-administered medications prior to visit.      ROS Review of Systems  Constitutional: Negative for chills, fever and malaise/fatigue.  Eyes: Negative for blurred vision.  Respiratory: Negative for shortness of breath.   Cardiovascular: Negative for chest pain and palpitations.  Gastrointestinal: Negative for abdominal pain and nausea.  Genitourinary: Negative for dysuria and hematuria.  Musculoskeletal: Positive for back pain. Negative for joint pain and myalgias.  Skin: Negative for rash.  Neurological: Negative for tingling and headaches.  Psychiatric/Behavioral: Negative for depression. The patient is not nervous/anxious.     Objective:  BP 113/75 (BP Location: Left Arm, Patient Position: Sitting)   Pulse 82   Temp 98.1 F (36.7 C) (Oral)   Wt (!) 395 lb (179.2 kg)   SpO2 98%   BMI 61.87 kg/m   BP/Weight 04/27/2017 07/21/2750 7/0/0174  Systolic BP 944 - 967  Diastolic BP 75 - 78  Wt. (Lbs) 395 391.54 393.6  BMI 61.87 61.32 60.74      Physical Exam  Constitutional: She is oriented to person, place, and time.  Well developed, morbidly obese, NAD, polite  HENT:  Head: Normocephalic and atraumatic.  Eyes: No scleral icterus.  Neck: Normal range of motion.  Cardiovascular: Normal rate, regular rhythm and normal heart sounds.   Pulmonary/Chest: Effort normal and breath sounds normal.  Musculoskeletal: She exhibits no edema.  Neurological: She is alert and oriented to person, place, and time.  Skin: Skin is warm and dry. No rash noted. No erythema. No pallor.  Psychiatric: She has a normal mood and affect. Her behavior is normal. Thought content normal.  Vitals reviewed.    Assessment & Plan:   1. Essential hypertension - Refill metoprolol (LOPRESSOR) 50 MG tablet; Take 1 tablet (50 mg  total) by mouth 2 (two) times daily.  Dispense: 180 tablet; Refill: 1 - Refill losartan (COZAAR) 25 MG tablet; Take 1 tablet (25 mg total) by mouth daily.  Dispense: 30 tablet; Refill: 6 - Refill hydrochlorothiazide (HYDRODIURIL) 25 MG tablet; Take 1 tablet (25 mg total) by mouth daily. Take on tablet in the morning.  Dispense: 90 tablet; Refill: 1 - Refill amLODipine (NORVASC) 5 MG tablet; Take 1 tablet (5 mg total) by mouth daily.  Dispense: 90 tablet; Refill: 1  2. Chronic midline low back pain with left-sided sciatica - Refill meloxicam (MOBIC) 15 MG tablet; TAKE ONE TABLET BY MOUTH ONCE DAILY .MUST  HAVE  OFFICE  VISIT  FOR  REFILLS  Dispense: 30 tablet; Refill: 1 - Refill methocarbamol (ROBAXIN) 500  MG tablet; Take 1 tablet (500 mg total) by mouth at bedtime.  Dispense: 30 tablet; Refill: 1  3. Midline low back pain with left-sided sciatica, unspecified chronicity - Refill traMADol (ULTRAM) 50 MG tablet; Take 1 tablet (50 mg total) by mouth every 8 (eight) hours as needed.  Dispense: 60 tablet; Refill: 0   Meds ordered this encounter  Medications  . traMADol (ULTRAM) 50 MG tablet    Sig: Take 1 tablet (50 mg total) by mouth every 8 (eight) hours as needed.    Dispense:  60 tablet    Refill:  0    Order Specific Question:   Supervising Provider    Answer:   Tresa Garter [id number]  . metoprolol (LOPRESSOR) 50 MG tablet    Sig: Take 1 tablet (50 mg total) by mouth 2 (two) times daily.    Dispense:  180 tablet    Refill:  1    Order Specific Question:   Supervising Provider    Answer:   Tresa Garter [id number]  . losartan (COZAAR) 25 MG tablet    Sig: Take 1 tablet (25 mg total) by mouth daily.    Dispense:  30 tablet    Refill:  6    Order Specific Question:   Supervising Provider    Answer:   Tresa Garter [id number]  . meloxicam (MOBIC) 15 MG tablet    Sig: TAKE ONE TABLET BY MOUTH ONCE DAILY .MUST  HAVE  OFFICE  VISIT  FOR  REFILLS    Dispense:  30 tablet     Refill:  1    Please consider 90 day supplies to promote better adherence    Order Specific Question:   Supervising Provider    Answer:   Tresa Garter [[id number]]  . hydrochlorothiazide (HYDRODIURIL) 25 MG tablet    Sig: Take 1 tablet (25 mg total) by mouth daily. Take on tablet in the morning.    Dispense:  90 tablet    Refill:  1    Order Specific Question:   Supervising Provider    Answer:   Tresa Garter [id number]  . methocarbamol (ROBAXIN) 500 MG tablet    Sig: Take 1 tablet (500 mg total) by mouth at bedtime.    Dispense:  30 tablet    Refill:  1    Please consider 90 day supplies to promote better adherence    Order Specific Question:   Supervising Provider    Answer:   Tresa Garter [id number]  . amLODipine (NORVASC) 5 MG tablet    Sig: Take 1 tablet (5 mg total) by mouth daily.    Dispense:  90 tablet    Refill:  1    Order Specific Question:   Supervising Provider    Answer:   Tresa Garter [id number]    Follow-up: Return in about 4 weeks (around 05/25/2017) for back pain.   Clent Demark PA

## 2017-04-28 ENCOUNTER — Encounter (HOSPITAL_COMMUNITY): Payer: Medicaid Other

## 2017-04-30 ENCOUNTER — Encounter (HOSPITAL_COMMUNITY): Payer: Medicaid Other

## 2017-05-19 NOTE — Addendum Note (Signed)
Encounter addended by: Warrick Parisian D on: 05/19/2017  8:46 AM<BR>    Actions taken: Visit Navigator Flowsheet section accepted

## 2017-05-21 ENCOUNTER — Encounter (HOSPITAL_COMMUNITY): Payer: Self-pay | Admitting: *Deleted

## 2017-05-21 NOTE — Progress Notes (Signed)
Discharge Summary  Patient Details  Name: JURI HACHEY MRN: [id number] Date of Birth: [birth year] Referring Provider:     CARDIAC REHAB PHASE II ORIENTATION from 02/02/2017 in [facility]  Referring Provider  Verdis Prime, MD       Number of Visits: 10 visits within a 12 week period Reason for Discharge: Pt given 12 weeks to complete 12 week program.  Smoking History:  History  Smoking Status  . Never Smoker  Smokeless Tobacco  . Never Used    Diagnosis:  No diagnosis found.  ADL UCSD:   Initial Exercise Prescription:   Discharge Exercise Prescription (Final Exercise Prescription Changes):     Exercise Prescription Changes - 04/21/17 1059      Response to Exercise   Blood Pressure (Admit) 123/89   Blood Pressure (Exercise) 134/79   Blood Pressure (Exit) 112/86   Heart Rate (Admit) 95 bpm   Heart Rate (Exercise) 117 bpm   Heart Rate (Exit) 94 bpm   Rating of Perceived Exertion (Exercise) 13   Symptoms none   Duration Progress to 30 minutes of  aerobic without signs/symptoms of physical distress   Intensity THRR unchanged     Progression   Progression Continue to progress workloads to maintain intensity without signs/symptoms of physical distress.   Average METs 2.5     Resistance Training   Training Prescription Yes   Weight 2lbs   Reps 10-15   Time 10 Minutes     NuStep   Level 3   Minutes 20   METs 1.9     Track   Laps 6   Minutes 6   METs 3.03     Home Exercise Plan   Plans to continue exercise at Home (comment)   Frequency Add 2 additional days to program exercise sessions.   Initial Home Exercises Provided 03/26/17     Exercise Review   Progression Yes      Functional Capacity:     6 Minute Walk    Row Name 04/22/17 1720         6 Minute Walk   Phase Discharge     Distance 1294 feet     Walk Time 6 minutes     # of Rest Breaks 0     MPH 2.45     METS 3.03     RPE 13     VO2 Peak 10.6      Symptoms Yes (comment)     Comments LBP!     Resting HR 95 bpm     Resting BP 123/89     Max Ex. HR 117 bpm     Max Ex. BP 134/74     2 Minute Post BP 112/86        Psychological, QOL, Others - Outcomes: PHQ 2/9: Depression screen [facility] 2/9 04/27/2017 04/13/2017 02/24/2017 02/19/2017 02/11/2017  Decreased Interest 0 0 0 0 0  Down, Depressed, Hopeless 0 0 0 0 0  PHQ - 2 Score 0 0 0 0 0    Quality of Life:   Personal Goals: Goals established at orientation with interventions provided to work toward goal.    Personal Goals Discharge:     Goals and Risk Factor Review    Row Name 04/14/17 1653             Core Components/Risk Factors/Patient Goals Review   Personal Goals Review Weight Management/Obesity;Lipids;Diabetes;Hypertension;Stress       Review continue to support and encourage  pt to attend on more consistent basis          Nutrition & Weight - Outcomes:     Pre Biometrics - 04/26/17 1102      Pre Biometrics   Height 5\' 7"  (1.702 m)       Nutrition:   Nutrition Discharge:   Education Questionnaire Score:   Pt did not return for her last exercise session.  Pt completed 10 sessions within the allowed 12 weeks of participation in cardiac rehab.  Pt did not return post assessment questionnaires. Pt did not maintain good attendance and would often miss from various issues some personal others medical. Pt will need ongoing support and encouragement in making lasting lifestyle changes due to limited resources and support at home. Alanson Aly, BSN Cardiac and Emergency planning/management officer

## 2017-05-28 ENCOUNTER — Encounter (INDEPENDENT_AMBULATORY_CARE_PROVIDER_SITE_OTHER): Payer: Self-pay | Admitting: Physician Assistant

## 2017-05-28 ENCOUNTER — Other Ambulatory Visit (INDEPENDENT_AMBULATORY_CARE_PROVIDER_SITE_OTHER): Payer: Medicaid Other

## 2017-05-28 ENCOUNTER — Ambulatory Visit (INDEPENDENT_AMBULATORY_CARE_PROVIDER_SITE_OTHER): Payer: Medicaid Other | Admitting: Physician Assistant

## 2017-05-28 VITALS — BP 115/75 | HR 95 | Temp 98.2°F | Wt 399.6 lb

## 2017-05-28 DIAGNOSIS — E1165 Type 2 diabetes mellitus with hyperglycemia: Secondary | ICD-10-CM

## 2017-05-28 DIAGNOSIS — R600 Localized edema: Secondary | ICD-10-CM

## 2017-05-28 DIAGNOSIS — Z794 Long term (current) use of insulin: Secondary | ICD-10-CM | POA: Diagnosis not present

## 2017-05-28 DIAGNOSIS — M5442 Lumbago with sciatica, left side: Secondary | ICD-10-CM | POA: Diagnosis not present

## 2017-05-28 DIAGNOSIS — R200 Anesthesia of skin: Secondary | ICD-10-CM | POA: Diagnosis not present

## 2017-05-28 DIAGNOSIS — R202 Paresthesia of skin: Secondary | ICD-10-CM

## 2017-05-28 DIAGNOSIS — I10 Essential (primary) hypertension: Secondary | ICD-10-CM | POA: Diagnosis not present

## 2017-05-28 DIAGNOSIS — G8929 Other chronic pain: Secondary | ICD-10-CM

## 2017-05-28 LAB — GLUCOSE, RANDOM: GLUCOSE: 140 mg/dL — AB (ref 70–99)

## 2017-05-28 MED ORDER — LOSARTAN POTASSIUM 25 MG PO TABS: 25.0000 mg | ORAL_TABLET | Freq: Every day | ORAL | 1 refills | Status: DC

## 2017-05-28 MED ORDER — TRAMADOL HCL 50 MG PO TABS: 100.0000 mg | ORAL_TABLET | Freq: Three times a day (TID) | ORAL | 0 refills | Status: DC | PRN

## 2017-05-28 MED ORDER — MELOXICAM 15 MG PO TABS: ORAL_TABLET | ORAL | 1 refills | Status: DC

## 2017-05-28 MED ORDER — METHOCARBAMOL 500 MG PO TABS: 500.0000 mg | ORAL_TABLET | Freq: Every day | ORAL | 1 refills | Status: DC

## 2017-05-28 MED ORDER — GABAPENTIN 300 MG PO CAPS: 600.0000 mg | ORAL_CAPSULE | Freq: Four times a day (QID) | ORAL | 3 refills | Status: DC

## 2017-05-28 NOTE — Progress Notes (Signed)
Subjective:  Patient ID: Lindsay [name], female    DOB: 25-Apr-1981  Age: 36 y.o. MRN: [id number]  CC: f/u obesity, HTN, and back pain  HPI Lindsay [name] is a 36 y.o. female with a PMH of multiple comorbidities presents on f/u of HTN, obesity and back pain. BP is well controlled on current regimen. Has finished her cardiac rehabilitation program. Still has mild LE edema bilaterally. Back pain w/sciatica continues and had only mild and temporary relief with Tramadol 50 mg. Medicaid has not covered anymore physical therapy sessions. Had one session covered. Request refill of Robaxin and Meloxicam. Lastly, would like to address obesity. Has never been to weight loss clinic. Goes to endocrinology regularly for control of her DM. Denies any other complaints or symptoms.    Outpatient Medications Prior to Visit  Medication Sig Dispense Refill  . ACCU-CHEK SOFTCLIX LANCETS lancets Use as instructed for 4 times daily blood glucose monitoring 100 each 12  . albuterol (PROVENTIL HFA;VENTOLIN HFA) 108 (90 Base) MCG/ACT inhaler Inhale 2 puffs into the lungs every 6 (six) hours as needed for wheezing or shortness of breath. 1 Inhaler 2  . amLODipine (NORVASC) 5 MG tablet Take 1 tablet (5 mg total) by mouth daily. 90 tablet 1  . aspirin 81 MG tablet Take 81 mg by mouth daily.    Marland Kitchen atorvastatin (LIPITOR) 80 MG tablet Take 1 tablet (80 mg total) by mouth daily at 6 PM. 30 tablet 6  . Blood Glucose Monitoring Suppl (ACCU-CHEK AVIVA PLUS) w/Device KIT Use as directed for 4 times daily blood glucose monitoring 1 kit 0  . diphenhydrAMINE (BENADRYL) 25 MG tablet Take 1 tablet (25 mg total) by mouth every 6 (six) hours as needed. 30 tablet 0  . empagliflozin (JARDIANCE) 10 MG TABS tablet Take 10 mg by mouth daily with breakfast. 30 tablet 3  . exenatide (BYETTA 5 MCG PEN) 5 MCG/0.02ML SOPN injection Inject 0.02 mLs (5 mcg total) into the skin 2 (two) times daily with a meal. 1 pen 2  . glucose blood (ACCU-CHEK  AVIVA PLUS) test strip Use as instructed for 4 times daily blood glucose monitoring 100 each 12  . hydrochlorothiazide (HYDRODIURIL) 25 MG tablet Take 1 tablet (25 mg total) by mouth daily. Take on tablet in the morning. 90 tablet 1  . Insulin Disposable Pump (V-GO 20) KIT Use 1 pod daily for insulin DX code- E11.40 (Patient not taking: Reported on 04/07/2017) 30 kit 12  . Insulin Glargine (LANTUS SOLOSTAR) 100 UNIT/ML Solostar Pen Inject 60 units at bedtime. (Patient taking differently: Inject 30 in the morning and 30 units at bedtime.) 10 pen 1  . Insulin Pen Needle 31G X 5 MM MISC 1 application by Does not apply route Nightly. 1000 each 3  . insulin regular human CONCENTRATED (HUMULIN R) 500 UNIT/ML injection 70 units 30 minutes before each meal; 50 at bedtime. 80 mL 2  . Insulin Syringe-Needle U-100 (BD INSULIN SYRINGE ULTRAFINE) 31G X 15/64" 1 ML MISC Use to inject insulin daily 100 each 1  . Lancet Devices (ACCU-CHEK SOFTCLIX) lancets Use as instructed for 4 times daily blood glucose monitoring 1 each 0  . metoprolol (LOPRESSOR) 50 MG tablet Take 1 tablet (50 mg total) by mouth 2 (two) times daily. 180 tablet 1  . Multiple Vitamins-Minerals (MULTIVITAMIN PO) Take 1 tablet by mouth daily.    . nitroGLYCERIN (NITROSTAT) 0.4 MG SL tablet Place 1 tablet (0.4 mg total) under the tongue every 5 (five) minutes as needed  for chest pain. 25 tablet 12  . sodium chloride (OCEAN) 0.65 % SOLN nasal spray Place 1 spray into both nostrils as needed for congestion. 1 Bottle 0  . ticagrelor (BRILINTA) 90 MG TABS tablet Take 90 mg by mouth 2 (two) times daily.    Marland Kitchen gabapentin (NEURONTIN) 300 MG capsule Take 2 capsules (600 mg total) by mouth 4 (four) times daily. 240 capsule 3  . losartan (COZAAR) 25 MG tablet Take 1 tablet (25 mg total) by mouth daily. 30 tablet 6  . meloxicam (MOBIC) 15 MG tablet TAKE ONE TABLET BY MOUTH ONCE DAILY .MUST  HAVE  OFFICE  VISIT  FOR  REFILLS 30 tablet 1  . methocarbamol (ROBAXIN)  500 MG tablet Take 1 tablet (500 mg total) by mouth at bedtime. 30 tablet 1  . traMADol (ULTRAM) 50 MG tablet Take 1 tablet (50 mg total) by mouth every 8 (eight) hours as needed. 60 tablet 0   No facility-administered medications prior to visit.      ROS Review of Systems  Constitutional: Negative for chills, fever and malaise/fatigue.       Obesity  Eyes: Negative for blurred vision.  Respiratory: Negative for shortness of breath.   Cardiovascular: Positive for leg swelling. Negative for chest pain and palpitations.  Gastrointestinal: Negative for abdominal pain and nausea.  Genitourinary: Negative for dysuria and hematuria.  Musculoskeletal: Positive for back pain. Negative for joint pain and myalgias.  Skin: Negative for rash.  Neurological: Negative for tingling and headaches.  Psychiatric/Behavioral: Negative for depression. The patient is not nervous/anxious.     Objective:  BP 115/75 (BP Location: Left Arm, Patient Position: Sitting, Cuff Size: Large)   Pulse 95   Temp 98.2 F (36.8 C) (Oral)   Wt (!) 399 lb 9.6 oz (181.3 kg)   SpO2 100%   BMI 62.59 kg/m   BP/Weight 05/28/2017 01/27/349 0/08/3817  Systolic BP 299 371 -  Diastolic BP 75 75 -  Wt. (Lbs) 399.6 395 391.54  BMI 62.59 61.87 61.32      Physical Exam  Constitutional: She is oriented to person, place, and time.  Morbidly obese, NAD, polite  HENT:  Head: Normocephalic and atraumatic.  Eyes: No scleral icterus.  Neck: Normal range of motion. Neck supple.  Difficult to assess for thyromegaly due to morbid obesity  Cardiovascular: Normal rate, regular rhythm and normal heart sounds.   Pulmonary/Chest: Effort normal and breath sounds normal.  Musculoskeletal: She exhibits edema (trace LE pitting edema bilaterally).  Neurological: She is alert and oriented to person, place, and time. No cranial nerve deficit. Coordination normal.  Skin: Skin is warm and dry. No rash noted. No erythema. No pallor.   Psychiatric: She has a normal mood and affect. Her behavior is normal. Thought content normal.  Vitals reviewed.    Assessment & Plan:   1. Essential hypertension - Refill losartan (COZAAR) 25 MG tablet; Take 1 tablet (25 mg total) by mouth daily.  Dispense: 90 tablet; Refill: 1 - TSH  2. Morbid obesity ([place]) - TSH - Referral entry in Epic for weight loss clinic not found. Will need to contact referral specialist to help guide proper entry.  3. Chronic midline low back pain with left-sided sciatica - Refill methocarbamol (ROBAXIN) 500 MG tablet; Take 1 tablet (500 mg total) by mouth at bedtime.  Dispense: 30 tablet; Refill: 1 - Refill meloxicam (MOBIC) 15 MG tablet; TAKE ONE TABLET BY MOUTH ONCE DAILY .MUST  HAVE  OFFICE  VISIT  FOR  REFILLS  Dispense: 30 tablet; Refill: 1 - Increase Tramadol 50 mg from one tab q8hrs to 2 tabs q8hrs PRN  4. Numbness and tingling - Refill gabapentin (NEURONTIN) 300 MG capsule; Take 2 capsules (600 mg total) by mouth 4 (four) times daily.  Dispense: 240 capsule; Refill: 3  5. Bilateral leg edema - Begin Compression stockings - Elevate LEs when possible and needed. - BP on soft side to add another diuretc.   Meds ordered this encounter  Medications  . traMADol (ULTRAM) 50 MG tablet    Sig: Take 2 tablets (100 mg total) by mouth every 8 (eight) hours as needed.    Dispense:  90 tablet    Refill:  0    Order Specific Question:   Supervising Provider    Answer:   Tresa Garter [id number]  . methocarbamol (ROBAXIN) 500 MG tablet    Sig: Take 1 tablet (500 mg total) by mouth at bedtime.    Dispense:  30 tablet    Refill:  1    Please consider 90 day supplies to promote better adherence    Order Specific Question:   Supervising Provider    Answer:   Tresa Garter [id number]  . meloxicam (MOBIC) 15 MG tablet    Sig: TAKE ONE TABLET BY MOUTH ONCE DAILY .MUST  HAVE  OFFICE  VISIT  FOR  REFILLS    Dispense:  30 tablet    Refill:  1     Please consider 90 day supplies to promote better adherence    Order Specific Question:   Supervising Provider    Answer:   Tresa Garter [[id number]]  . gabapentin (NEURONTIN) 300 MG capsule    Sig: Take 2 capsules (600 mg total) by mouth 4 (four) times daily.    Dispense:  240 capsule    Refill:  3    Order Specific Question:   Supervising Provider    Answer:   Tresa Garter [id number]  . losartan (COZAAR) 25 MG tablet    Sig: Take 1 tablet (25 mg total) by mouth daily.    Dispense:  90 tablet    Refill:  1    Order Specific Question:   Supervising Provider    Answer:   Tresa Garter [id number]    Follow-up: Return in about 4 months (around 09/27/2017) for HTN, obesity.   Clent Demark PA

## 2017-05-28 NOTE — Patient Instructions (Signed)
Obesity, Adult Obesity is the condition of having too much total body fat. Being overweight or obese means that your weight is greater than what is considered healthy for your body size. Obesity is determined by a measurement called BMI. BMI is an estimate of body fat and is calculated from height and weight. For adults, a BMI of 30 or higher is considered obese. Obesity can eventually lead to other health concerns and major illnesses, including:  Stroke.  Coronary artery disease (CAD).  Type 2 diabetes.  Some types of cancer, including cancers of the colon, breast, uterus, and gallbladder.  Osteoarthritis.  High blood pressure (hypertension).  High cholesterol.  Sleep apnea.  Gallbladder stones.  Infertility problems.  What are the causes? The main cause of obesity is taking in (consuming) more calories than your body uses for energy. Other factors that contribute to this condition may include:  Being born with genes that make you more likely to become obese.  Having a medical condition that causes obesity. These conditions include: ? Hypothyroidism. ? Polycystic ovarian syndrome (PCOS). ? Binge-eating disorder. ? Cushing syndrome.  Taking certain medicines, such as steroids, antidepressants, and seizure medicines.  Not being physically active (sedentary lifestyle).  Living where there are limited places to exercise safely or buy healthy foods.  Not getting enough sleep.  What increases the risk? The following factors may increase your risk of this condition:  Having a family history of obesity.  Being a woman of African-American descent.  Being a man of Hispanic descent.  What are the signs or symptoms? Having excessive body fat is the main symptom of this condition. How is this diagnosed? This condition may be diagnosed based on:  Your symptoms.  Your medical history.  A physical exam. Your health care provider may measure: ? Your BMI. If you are an  adult with a BMI between 25 and less than 30, you are considered overweight. If you are an adult with a BMI of 30 or higher, you are considered obese. ? The distances around your hips and your waist (circumferences). These may be compared to each other to help diagnose your condition. ? Your skinfold thickness. Your health care provider may gently pinch a fold of your skin and measure it.  How is this treated? Treatment for this condition often includes changing your lifestyle. Treatment may include some or all of the following:  Dietary changes. Work with your health care provider and a dietitian to set a weight-loss goal that is healthy and reasonable for you. Dietary changes may include eating: ? Smaller portions. A portion size is the amount of a particular food that is healthy for you to eat at one time. This varies from person to person. ? Low-calorie or low-fat options. ? More whole grains, fruits, and vegetables.  Regular physical activity. This may include aerobic activity (cardio) and strength training.  Medicine to help you lose weight. Your health care provider may prescribe medicine if you are unable to lose 1 pound a week after 6 weeks of eating more healthily and doing more physical activity.  Surgery. Surgical options may include gastric banding and gastric bypass. Surgery may be done if: ? Other treatments have not helped to improve your condition. ? You have a BMI of 40 or higher. ? You have life-threatening health problems related to obesity.  Follow these instructions at home:  Eating and drinking   Follow recommendations from your health care provider about what you eat and drink. Your health   care provider may advise you to: ? Limit fast foods, sweets, and processed snack foods. ? Choose low-fat options, such as low-fat milk instead of whole milk. ? Eat 5 or more servings of fruits or vegetables every day. ? Eat at home more often. This gives you more control over  what you eat. ? Choose healthy foods when you eat out. ? Learn what a healthy portion size is. ? Keep low-fat snacks on hand. ? Avoid sugary drinks, such as soda, fruit juice, iced tea sweetened with sugar, and flavored milk. ? Eat a healthy breakfast.  Drink enough water to keep your urine clear or pale yellow.  Do not go without eating for long periods of time (do not fast) or follow a fad diet. Fasting and fad diets can be unhealthy and even dangerous. Physical Activity  Exercise regularly, as told by your health care provider. Ask your health care provider what types of exercise are safe for you and how often you should exercise.  Warm up and stretch before being active.  Cool down and stretch after being active.  Rest between periods of activity. Lifestyle  Limit the time that you spend in front of your TV, computer, or video game system.  Find ways to reward yourself that do not involve food.  Limit alcohol intake to no more than 1 drink a day for nonpregnant women and 2 drinks a day for men. One drink equals 12 oz of beer, 5 oz of wine, or 1 oz of hard liquor. General instructions  Keep a weight loss journal to keep track of the food you eat and how much you exercise you get.  Take over-the-counter and prescription medicines only as told by your health care provider.  Take vitamins and supplements only as told by your health care provider.  Consider joining a support group. Your health care provider may be able to recommend a support group.  Keep all follow-up visits as told by your health care provider. This is important. Contact a health care provider if:  You are unable to meet your weight loss goal after 6 weeks of dietary and lifestyle changes. This information is not intended to replace advice given to you by your health care provider. Make sure you discuss any questions you have with your health care provider. Document Released: 01/14/2005 Document Revised:  05/11/2016 Document Reviewed: 09/25/2015 Elsevier Interactive Patient Education  2017 [employer]  

## 2017-05-29 LAB — TSH: TSH: 2.93 u[IU]/mL (ref 0.450–4.500)

## 2017-05-29 LAB — FRUCTOSAMINE: Fructosamine: 269 umol/L (ref 0–285)

## 2017-06-02 ENCOUNTER — Encounter: Payer: Self-pay | Admitting: Endocrinology

## 2017-06-02 ENCOUNTER — Encounter (INDEPENDENT_AMBULATORY_CARE_PROVIDER_SITE_OTHER): Payer: Medicaid Other | Admitting: Endocrinology

## 2017-06-02 ENCOUNTER — Ambulatory Visit (INDEPENDENT_AMBULATORY_CARE_PROVIDER_SITE_OTHER): Payer: Medicaid Other | Admitting: Endocrinology

## 2017-06-02 VITALS — BP 124/74 | HR 81 | Ht 67.5 in | Wt 393.4 lb

## 2017-06-02 DIAGNOSIS — Z794 Long term (current) use of insulin: Secondary | ICD-10-CM

## 2017-06-02 DIAGNOSIS — E1165 Type 2 diabetes mellitus with hyperglycemia: Secondary | ICD-10-CM

## 2017-06-02 NOTE — Patient Instructions (Addendum)
TAKE BYETTA BEFORE LUNCH and supper  Lantus 25 in am and 30 in pm  Humulin R take 8 at lunch and supper  More Testing

## 2017-06-02 NOTE — Progress Notes (Signed)
Patient ID: Lindsay [name], female   DOB: Nov 29, 1981, 36 y.o.   MRN: [id number]            Reason for Appointment: Type II Diabetes, follow-up    History of Present Illness   Diagnosis date for diabetes: 2004  Previous history:  Was started on insulin about 2008 No prior records are available  Recent history:  Non-insulin hypoglycemic drugs: Byetta 5 g twice a day, Jardiance 10 mg daily  Insulin regimen: Lantus 30 bid, HUMULIN R U-500 with syringe: 5-10 ac and 0 at bedtime  Her fructosamine is now 269, previously A1c 9.3  Because of her poor control she was given Byetta in addition to her basal bolus insulin on her last visit in May  Current management, blood sugar patterns and problems identified:  She has  brought her monitor for download today but has only 11 readings in the last month, she says she does not find time to check her sugar because of busy schedule with children  Even though she was told to take Byetta before her meals she is taking this in the morning when she is not eating and then before lunch but not at suppertime  She is not adjusting her mealtime doses based on what she is eating and taking 5 units in the morning on waking up and also with lunch and 10 units before supper  She has only a few readings in the mornings which are overall increased but the last reading was 134  Early afternoon readings after her first meal are 78 and 81  She thinks she feels hypoglycemic symptoms before suppertime when her sugars are ranging from 75-96  She has only 2 readings later at night which are high at 177 and 206  Her weight is about the same although fluctuating some  She thinks she is doing well with her portion control especially with starting Byetta         Side effects from medications: None        Proper timing of medications in relation to meals: Yes.          Monitors blood glucose: Once or twice a day.    Glucometer:  Accu-Chek  Blood Glucose  readings as above: Overall average 123 with higher readings at night and mornings   Hypoglycemia:  as above, lowest reading 75         Meals: 3 meals per day. Drinks water, Avoiding drinks with sugar No Bfst, lunch 1-2 p.m. supper variable          Physical activity: exercise: walking some at times            Dietician visit: Most recent: 2/18     Weight control:  Wt Readings from Last 3 Encounters:  06/02/17 (!) 393 lb 6.4 oz (178.4 kg)  06/02/17 (!) 393 lb 6.4 oz (178.4 kg)  05/28/17 (!) 399 lb 9.6 oz (181.3 kg)           Diabetes labs:  Lab Results  Component Value Date   HGBA1C 9.3 04/13/2017   HGBA1C 12.5 01/18/2017   HGBA1C 12.4 (H) 09/08/2016   Lab Results  Component Value Date   MICROALBUR 9.6 (H) 02/18/2017   LDLCALC 54 10/28/2016   CREATININE 0.83 04/13/2017     Allergies as of 06/02/2017      Reactions   Hydrazine Yellow [tartrazine] Shortness Of Breath, Swelling   Swelling mostly noticed in legs and feet, retaining urination, shortness of breaht,  Patient ID: Lindsay [name], female   DOB: Nov 29, 1981, 36 y.o.   MRN: [id number]            Reason for Appointment: Type II Diabetes, follow-up    History of Present Illness   Diagnosis date for diabetes: 2004  Previous history:  Was started on insulin about 2008 No prior records are available  Recent history:  Non-insulin hypoglycemic drugs: Byetta 5 g twice a day, Jardiance 10 mg daily  Insulin regimen: Lantus 30 bid, HUMULIN R U-500 with syringe: 5-10 ac and 0 at bedtime  Her fructosamine is now 269, previously A1c 9.3  Because of her poor control she was given Byetta in addition to her basal bolus insulin on her last visit in May  Current management, blood sugar patterns and problems identified:  She has  brought her monitor for download today but has only 11 readings in the last month, she says she does not find time to check her sugar because of busy schedule with children  Even though she was told to take Byetta before her meals she is taking this in the morning when she is not eating and then before lunch but not at suppertime  She is not adjusting her mealtime doses based on what she is eating and taking 5 units in the morning on waking up and also with lunch and 10 units before supper  She has only a few readings in the mornings which are overall increased but the last reading was 134  Early afternoon readings after her first meal are 78 and 81  She thinks she feels hypoglycemic symptoms before suppertime when her sugars are ranging from 75-96  She has only 2 readings later at night which are high at 177 and 206  Her weight is about the same although fluctuating some  She thinks she is doing well with her portion control especially with starting Byetta         Side effects from medications: None        Proper timing of medications in relation to meals: Yes.          Monitors blood glucose: Once or twice a day.    Glucometer:  Accu-Chek  Blood Glucose  readings as above: Overall average 123 with higher readings at night and mornings   Hypoglycemia:  as above, lowest reading 75         Meals: 3 meals per day. Drinks water, Avoiding drinks with sugar No Bfst, lunch 1-2 p.m. supper variable          Physical activity: exercise: walking some at times            Dietician visit: Most recent: 2/18     Weight control:  Wt Readings from Last 3 Encounters:  06/02/17 (!) 393 lb 6.4 oz (178.4 kg)  06/02/17 (!) 393 lb 6.4 oz (178.4 kg)  05/28/17 (!) 399 lb 9.6 oz (181.3 kg)           Diabetes labs:  Lab Results  Component Value Date   HGBA1C 9.3 04/13/2017   HGBA1C 12.5 01/18/2017   HGBA1C 12.4 (H) 09/08/2016   Lab Results  Component Value Date   MICROALBUR 9.6 (H) 02/18/2017   LDLCALC 54 10/28/2016   CREATININE 0.83 04/13/2017     Allergies as of 06/02/2017      Reactions   Hydrazine Yellow [tartrazine] Shortness Of Breath, Swelling   Swelling mostly noticed in legs and feet, retaining urination, shortness of breaht,  Patient ID: Lindsay [name], female   DOB: Nov 29, 1981, 36 y.o.   MRN: [id number]            Reason for Appointment: Type II Diabetes, follow-up    History of Present Illness   Diagnosis date for diabetes: 2004  Previous history:  Was started on insulin about 2008 No prior records are available  Recent history:  Non-insulin hypoglycemic drugs: Byetta 5 g twice a day, Jardiance 10 mg daily  Insulin regimen: Lantus 30 bid, HUMULIN R U-500 with syringe: 5-10 ac and 0 at bedtime  Her fructosamine is now 269, previously A1c 9.3  Because of her poor control she was given Byetta in addition to her basal bolus insulin on her last visit in May  Current management, blood sugar patterns and problems identified:  She has  brought her monitor for download today but has only 11 readings in the last month, she says she does not find time to check her sugar because of busy schedule with children  Even though she was told to take Byetta before her meals she is taking this in the morning when she is not eating and then before lunch but not at suppertime  She is not adjusting her mealtime doses based on what she is eating and taking 5 units in the morning on waking up and also with lunch and 10 units before supper  She has only a few readings in the mornings which are overall increased but the last reading was 134  Early afternoon readings after her first meal are 78 and 81  She thinks she feels hypoglycemic symptoms before suppertime when her sugars are ranging from 75-96  She has only 2 readings later at night which are high at 177 and 206  Her weight is about the same although fluctuating some  She thinks she is doing well with her portion control especially with starting Byetta         Side effects from medications: None        Proper timing of medications in relation to meals: Yes.          Monitors blood glucose: Once or twice a day.    Glucometer:  Accu-Chek  Blood Glucose  readings as above: Overall average 123 with higher readings at night and mornings   Hypoglycemia:  as above, lowest reading 75         Meals: 3 meals per day. Drinks water, Avoiding drinks with sugar No Bfst, lunch 1-2 p.m. supper variable          Physical activity: exercise: walking some at times            Dietician visit: Most recent: 2/18     Weight control:  Wt Readings from Last 3 Encounters:  06/02/17 (!) 393 lb 6.4 oz (178.4 kg)  06/02/17 (!) 393 lb 6.4 oz (178.4 kg)  05/28/17 (!) 399 lb 9.6 oz (181.3 kg)           Diabetes labs:  Lab Results  Component Value Date   HGBA1C 9.3 04/13/2017   HGBA1C 12.5 01/18/2017   HGBA1C 12.4 (H) 09/08/2016   Lab Results  Component Value Date   MICROALBUR 9.6 (H) 02/18/2017   LDLCALC 54 10/28/2016   CREATININE 0.83 04/13/2017     Allergies as of 06/02/2017      Reactions   Hydrazine Yellow [tartrazine] Shortness Of Breath, Swelling   Swelling mostly noticed in legs and feet, retaining urination, shortness of breaht,  Patient ID: Lindsay [name], female   DOB: Nov 29, 1981, 36 y.o.   MRN: [id number]            Reason for Appointment: Type II Diabetes, follow-up    History of Present Illness   Diagnosis date for diabetes: 2004  Previous history:  Was started on insulin about 2008 No prior records are available  Recent history:  Non-insulin hypoglycemic drugs: Byetta 5 g twice a day, Jardiance 10 mg daily  Insulin regimen: Lantus 30 bid, HUMULIN R U-500 with syringe: 5-10 ac and 0 at bedtime  Her fructosamine is now 269, previously A1c 9.3  Because of her poor control she was given Byetta in addition to her basal bolus insulin on her last visit in May  Current management, blood sugar patterns and problems identified:  She has  brought her monitor for download today but has only 11 readings in the last month, she says she does not find time to check her sugar because of busy schedule with children  Even though she was told to take Byetta before her meals she is taking this in the morning when she is not eating and then before lunch but not at suppertime  She is not adjusting her mealtime doses based on what she is eating and taking 5 units in the morning on waking up and also with lunch and 10 units before supper  She has only a few readings in the mornings which are overall increased but the last reading was 134  Early afternoon readings after her first meal are 78 and 81  She thinks she feels hypoglycemic symptoms before suppertime when her sugars are ranging from 75-96  She has only 2 readings later at night which are high at 177 and 206  Her weight is about the same although fluctuating some  She thinks she is doing well with her portion control especially with starting Byetta         Side effects from medications: None        Proper timing of medications in relation to meals: Yes.          Monitors blood glucose: Once or twice a day.    Glucometer:  Accu-Chek  Blood Glucose  readings as above: Overall average 123 with higher readings at night and mornings   Hypoglycemia:  as above, lowest reading 75         Meals: 3 meals per day. Drinks water, Avoiding drinks with sugar No Bfst, lunch 1-2 p.m. supper variable          Physical activity: exercise: walking some at times            Dietician visit: Most recent: 2/18     Weight control:  Wt Readings from Last 3 Encounters:  06/02/17 (!) 393 lb 6.4 oz (178.4 kg)  06/02/17 (!) 393 lb 6.4 oz (178.4 kg)  05/28/17 (!) 399 lb 9.6 oz (181.3 kg)           Diabetes labs:  Lab Results  Component Value Date   HGBA1C 9.3 04/13/2017   HGBA1C 12.5 01/18/2017   HGBA1C 12.4 (H) 09/08/2016   Lab Results  Component Value Date   MICROALBUR 9.6 (H) 02/18/2017   LDLCALC 54 10/28/2016   CREATININE 0.83 04/13/2017     Allergies as of 06/02/2017      Reactions   Hydrazine Yellow [tartrazine] Shortness Of Breath, Swelling   Swelling mostly noticed in legs and feet, retaining urination, shortness of breaht,

## 2017-06-02 NOTE — Progress Notes (Signed)
This encounter was created in error - please disregard.

## 2017-07-03 ENCOUNTER — Other Ambulatory Visit: Payer: Self-pay | Admitting: Endocrinology

## 2017-07-07 ENCOUNTER — Telehealth: Payer: Self-pay | Admitting: Endocrinology

## 2017-07-07 ENCOUNTER — Other Ambulatory Visit: Payer: Self-pay

## 2017-07-07 MED ORDER — EMPAGLIFLOZIN 10 MG PO TABS: 10.0000 mg | ORAL_TABLET | Freq: Every day | ORAL | 3 refills | Status: DC

## 2017-07-07 NOTE — Telephone Encounter (Signed)
**  Remind patient they can make refill requests via MyChart**  Medication refill request (Name & Dosage):  JARDIANCE 10 MG TABS tablet  Preferred pharmacy (Name & Address):  [facility] [place], [place] - [address] [phone] (Phone) [phone] (Fax)   Other comments (if applicable):  Patient stated she requested the rx on Sunday, please execute today if possible.

## 2017-07-07 NOTE — Telephone Encounter (Signed)
This has been ordered 

## 2017-07-13 ENCOUNTER — Telehealth: Payer: Self-pay

## 2017-07-13 NOTE — Telephone Encounter (Signed)
Left vm requesting patient call her insurance and see what is covered in place of the jardiance because it is not covered

## 2017-07-19 ENCOUNTER — Telehealth (INDEPENDENT_AMBULATORY_CARE_PROVIDER_SITE_OTHER): Payer: Self-pay | Admitting: Physician Assistant

## 2017-07-19 ENCOUNTER — Telehealth: Payer: Self-pay

## 2017-07-19 NOTE — Telephone Encounter (Signed)
FWD to PCP. Tempestt S Roberts, CMA  

## 2017-07-19 NOTE — Telephone Encounter (Signed)
Spoke to patient and explained that jardiance is no longer covered and that she would have to call hewr insurance and see what is covered- patient stated an understanding and agreed to call back to let me know what is covered

## 2017-07-19 NOTE — Telephone Encounter (Signed)
Patient called needs refills on Albuteral Inhaler,  Please send to [facility] on [address].   Please follow up with patient

## 2017-07-20 ENCOUNTER — Other Ambulatory Visit (INDEPENDENT_AMBULATORY_CARE_PROVIDER_SITE_OTHER): Payer: Self-pay | Admitting: Physician Assistant

## 2017-07-20 DIAGNOSIS — J45909 Unspecified asthma, uncomplicated: Secondary | ICD-10-CM

## 2017-07-20 MED ORDER — ALBUTEROL SULFATE HFA 108 (90 BASE) MCG/ACT IN AERS: 2.0000 | INHALATION_SPRAY | Freq: Four times a day (QID) | RESPIRATORY_TRACT | 2 refills | Status: DC | PRN

## 2017-07-20 NOTE — Telephone Encounter (Signed)
I have sent the albuterol refill to [facility] at [facility].

## 2017-07-30 ENCOUNTER — Other Ambulatory Visit (INDEPENDENT_AMBULATORY_CARE_PROVIDER_SITE_OTHER): Payer: Medicaid Other

## 2017-07-30 DIAGNOSIS — E1165 Type 2 diabetes mellitus with hyperglycemia: Secondary | ICD-10-CM

## 2017-07-30 DIAGNOSIS — Z794 Long term (current) use of insulin: Secondary | ICD-10-CM | POA: Diagnosis not present

## 2017-07-30 LAB — COMPREHENSIVE METABOLIC PANEL
ALK PHOS: 87 U/L (ref 39–117)
ALT: 22 U/L (ref 0–35)
AST: 21 U/L (ref 0–37)
Albumin: 3.9 g/dL (ref 3.5–5.2)
BUN: 16 mg/dL (ref 6–23)
CO2: 28 meq/L (ref 19–32)
Calcium: 9.2 mg/dL (ref 8.4–10.5)
Chloride: 103 mEq/L (ref 96–112)
Creatinine, Ser: 0.85 mg/dL (ref 0.40–1.20)
GFR: 97.39 mL/min (ref >60.00)
GLUCOSE: 218 mg/dL — AB (ref 70–99)
POTASSIUM: 3.8 meq/L (ref 3.5–5.1)
SODIUM: 136 meq/L (ref 135–145)
TOTAL PROTEIN: 7.1 g/dL (ref 6.0–8.3)
Total Bilirubin: 0.3 mg/dL (ref 0.2–1.2)

## 2017-07-30 LAB — HEMOGLOBIN A1C: HEMOGLOBIN A1C: 8.2 % — AB (ref 4.6–6.5)

## 2017-08-02 ENCOUNTER — Ambulatory Visit: Payer: Medicaid Other | Admitting: Endocrinology

## 2017-08-02 ENCOUNTER — Other Ambulatory Visit: Payer: Self-pay | Admitting: Physician Assistant

## 2017-08-02 ENCOUNTER — Telehealth: Payer: Self-pay

## 2017-08-02 NOTE — Telephone Encounter (Signed)
Spoke to the patient and she stated that she would call her insurance company and see what is covered but has not been able to this- patient stated she would now make sure to call her insurance company and than call me back to let me know what is covered to take the place of jardiance

## 2017-08-03 ENCOUNTER — Encounter: Payer: Self-pay | Admitting: Endocrinology

## 2017-08-03 ENCOUNTER — Ambulatory Visit (INDEPENDENT_AMBULATORY_CARE_PROVIDER_SITE_OTHER): Payer: Medicaid Other | Admitting: Endocrinology

## 2017-08-03 VITALS — BP 138/82 | HR 95 | Ht 67.5 in | Wt >= 6400 oz

## 2017-08-03 DIAGNOSIS — Z794 Long term (current) use of insulin: Secondary | ICD-10-CM

## 2017-08-03 DIAGNOSIS — E1140 Type 2 diabetes mellitus with diabetic neuropathy, unspecified: Secondary | ICD-10-CM | POA: Diagnosis not present

## 2017-08-03 DIAGNOSIS — E1165 Type 2 diabetes mellitus with hyperglycemia: Secondary | ICD-10-CM | POA: Diagnosis not present

## 2017-08-03 MED ORDER — EXENATIDE 10 MCG/0.04ML ~~LOC~~ SOPN: 10.0000 ug | PEN_INJECTOR | Freq: Two times a day (BID) | SUBCUTANEOUS | 2 refills | Status: DC

## 2017-08-03 MED ORDER — DULOXETINE HCL 30 MG PO CPEP: 30.0000 mg | ORAL_CAPSULE | Freq: Every day | ORAL | 3 refills | Status: DC

## 2017-08-03 NOTE — Progress Notes (Signed)
Patient ID: Lindsay [name], female   DOB: 1981/06/24, 36 y.o.   MRN: [id number]            Reason for Appointment: Type II Diabetes, follow-up    History of Present Illness   Diagnosis date for diabetes: 2004  Previous history:  Was started on insulin about 2008 No prior records are available  Recent history:  Non-insulin hypoglycemic drugs: Byetta 5 g in a.m., Jardiance 10 mg daily  Insulin regimen: Lantus 30 bid, HUMULIN R U-500 with syringe: 5-12 ac and 5 at bedtime  Her A1c is 8.2, previously A1c 9.3  Because of her poor control she was given Byetta in addition to her basal bolus insulin on her  visit in May  Current management, blood sugar patterns and problems identified:  She has not brought her monitor for download today  Although she was told to take Byetta before breakfast and lunch she still is doing it only before breakfast  Glucose in the lab after breakfast was 218  She says that blood sugar will go up after meals especially breakfast and lunch if she is eating a large amount of carbohydrate or cereal in the morning  She cannot adequately explain why her weight has gone up tremendously since her last visit, previously had difficulty with portion control  She is not doing much physical activity  Although she was told to stop Humulin R at bedtime she is still taking this and occasionally will have hypoglycemia waking up with blood sugars in the 60s  Blood sugars are mostly high after breakfast or lunch and probably not after supper by recall         Side effects from medications: None        Proper timing of medications in relation to meals: Yes.          Monitors blood glucose: Once or twice a day.    Glucometer:  Accu-Chek  Blood Glucose readings by recall  Mean values apply above for all meters except median for One Touch  PRE-MEAL Fasting Lunch Dinner Bedtime Overall  Glucose range: 68-120      Mean/median:        POST-MEAL PC Breakfast PC  Lunch PC Dinner  Glucose range: upto 215 Max 245 150  Mean/median:               Meals: 3 meals per day. Drinks water, Avoiding drinks with sugar Variable breakfast, sometimes boiled eggs and toast, lunch 1-2 p.m. supper variable          Physical activity: exercise: walking irregularly            Dietician visit: Most recent: 2/18     Weight control:  Wt Readings from Last 3 Encounters:  08/03/17 (!) 420 lb 9.6 oz (190.8 kg)  06/02/17 (!) 393 lb 6.4 oz (178.4 kg)  06/02/17 (!) 393 lb 6.4 oz (178.4 kg)           Diabetes labs:  Lab Results  Component Value Date   HGBA1C 8.2 (H) 07/30/2017   HGBA1C 9.3 04/13/2017   HGBA1C 12.5 01/18/2017   Lab Results  Component Value Date   MICROALBUR 9.6 (H) 02/18/2017   LDLCALC 54 10/28/2016   CREATININE 0.85 07/30/2017     Allergies as of 08/03/2017      Reactions   Hydrazine Yellow [tartrazine] Shortness Of Breath, Swelling   Swelling mostly noticed in legs and feet, retaining urination, shortness of breaht, and minor chest pain  Lisinopril Shortness Of Breath   Was on prinzide; had sob/chest pain on it.   Tylenol [acetaminophen] Itching, Swelling   Itching of the mouth, swelling of tongue and stomach started hurting      Medication List       Accurate as of 08/03/17  2:20 PM. Always use your most recent med list.          ACCU-CHEK AVIVA PLUS w/Device Kit Use as directed for 4 times daily blood glucose monitoring   accu-chek softclix lancets Use as instructed for 4 times daily blood glucose monitoring   ACCU-CHEK SOFTCLIX LANCETS lancets Use as instructed for 4 times daily blood glucose monitoring   albuterol 108 (90 Base) MCG/ACT inhaler Commonly known as:  PROVENTIL HFA;VENTOLIN HFA Inhale 2 puffs into the lungs every 6 (six) hours as needed for wheezing or shortness of breath.   amLODipine 5 MG tablet Commonly known as:  NORVASC Take 1 tablet (5 mg total) by mouth daily.   aspirin 81 MG tablet Take 81  mg by mouth daily.   atorvastatin 80 MG tablet Commonly known as:  LIPITOR Take 1 tablet (80 mg total) by mouth daily at 6 PM. Please call and schedule November/December follow up. [phone]   diphenhydrAMINE 25 MG tablet Commonly known as:  BENADRYL Take 1 tablet (25 mg total) by mouth every 6 (six) hours as needed.   DULoxetine 30 MG capsule Commonly known as:  CYMBALTA Take 1 capsule (30 mg total) by mouth daily.   empagliflozin 10 MG Tabs tablet Commonly known as:  JARDIANCE Take 10 mg by mouth daily with breakfast.   exenatide 5 MCG/0.02ML Sopn injection Commonly known as:  BYETTA 5 MCG PEN Inject 0.02 mLs (5 mcg total) into the skin 2 (two) times daily with a meal.   gabapentin 300 MG capsule Commonly known as:  NEURONTIN Take 2 capsules (600 mg total) by mouth 4 (four) times daily.   glucose blood test strip Commonly known as:  ACCU-CHEK AVIVA PLUS Use as instructed for 4 times daily blood glucose monitoring   hydrochlorothiazide 25 MG tablet Commonly known as:  HYDRODIURIL Take 1 tablet (25 mg total) by mouth daily. Take on tablet in the morning.   Insulin Glargine 100 UNIT/ML Solostar Pen Commonly known as:  LANTUS SOLOSTAR Inject 60 units at bedtime.   Insulin Pen Needle 31G X 5 MM Misc 1 application by Does not apply route Nightly.   insulin regular human CONCENTRATED 500 UNIT/ML injection Commonly known as:  HUMULIN R 70 units 30 minutes before each meal; 50 at bedtime.   Insulin Syringe-Needle U-100 31G X 15/64" 1 ML Misc Commonly known as:  BD INSULIN SYRINGE ULTRAFINE Use to inject insulin daily   losartan 25 MG tablet Commonly known as:  COZAAR Take 1 tablet (25 mg total) by mouth daily.   Magnesium 250 MG Tabs Take 1 tablet by mouth 2 (two) times daily.   Melatonin 10 MG Tabs Take 3 tablets by mouth at bedtime.   meloxicam 15 MG tablet Commonly known as:  MOBIC TAKE ONE TABLET BY MOUTH ONCE DAILY .MUST  HAVE  OFFICE  VISIT  FOR   REFILLS   methocarbamol 500 MG tablet Commonly known as:  ROBAXIN Take 1 tablet (500 mg total) by mouth at bedtime.   metoprolol tartrate 50 MG tablet Commonly known as:  LOPRESSOR Take 1 tablet (50 mg total) by mouth 2 (two) times daily.   MULTIVITAMIN PO Take 1 tablet by mouth daily.  nitroGLYCERIN 0.4 MG SL tablet Commonly known as:  NITROSTAT Place 1 tablet (0.4 mg total) under the tongue every 5 (five) minutes as needed for chest pain.   sodium chloride 0.65 % Soln nasal spray Commonly known as:  OCEAN Place 1 spray into both nostrils as needed for congestion.   ticagrelor 90 MG Tabs tablet Commonly known as:  BRILINTA Take 90 mg by mouth 2 (two) times daily.   traMADol 50 MG tablet Commonly known as:  ULTRAM Take 2 tablets (100 mg total) by mouth every 8 (eight) hours as needed.   V-GO 20 Kit Use 1 pod daily for insulin DX code- E11.40       Allergies:  Allergies  Allergen Reactions  . Hydrazine Yellow [Tartrazine] Shortness Of Breath and Swelling    Swelling mostly noticed in legs and feet, retaining urination, shortness of breaht, and minor chest pain  . Lisinopril Shortness Of Breath    Was on prinzide; had sob/chest pain on it.  . Tylenol [Acetaminophen] Itching and Swelling    Itching of the mouth, swelling of tongue and stomach started hurting    Past Medical History:  Diagnosis Date  . ARF (acute renal failure) (HCC) 04/2015  . Asthma   . Cellulitis of right upper extremity   . Coronary artery disease   . Diabetes mellitus    insulin dependent  . Hyperlipidemia LDL goal <70   . Hypertension   . NSTEMI (non-ST elevated myocardial infarction) (HCC) 08/2016  . Obesity   . S/P angioplasty with stent 08/2016   DES to mLAD and PTCA only to 2nd diag ostium.     Past Surgical History:  Procedure Laterality Date  . CARDIAC CATHETERIZATION N/A 09/07/2016   Procedure: Left Heart Cath and Coronary Angiography;  Surgeon: Lyn Records, MD;  Location: [facility]  INVASIVE CV LAB;  Service: Cardiovascular;  Laterality: N/A;  . CARDIAC CATHETERIZATION N/A 09/07/2016   Procedure: Coronary Stent Intervention;  Surgeon: Lyn Records, MD;  Location: [facility] INVASIVE CV LAB;  Service: Cardiovascular;  Laterality: N/A;  . CARDIAC CATHETERIZATION N/A 09/07/2016   Procedure: Coronary Balloon Angioplasty;  Surgeon: Lyn Records, MD;  Location: [facility] INVASIVE CV LAB;  Service: Cardiovascular;  Laterality: N/A;  . CESAREAN SECTION    . IRRIGATION AND DEBRIDEMENT SHOULDER Right 04/30/2015   Procedure: IRRIGATION AND DEBRIDEMENT SHOULDER;  Surgeon: Sheral Apley, MD;  Location[facility] OR;  Service: Orthopedics;  Laterality: Right;  . IRRIGATION AND DEBRIDEMENT SHOULDER Right 05/01/2015  . LEG SURGERY    . SHOULDER ARTHROSCOPY Right 04/30/2015   Procedure: ARTHROSCOPY SHOULDER;  Surgeon: Sheral Apley, MD;  Location: [facility] OR;  Service: Orthopedics;  Laterality: Right;  . TONSILLECTOMY      Family History  Problem Relation Age of Onset  . Diabetes Mother   . Hypertension Mother   . Heart disease Mother   . Diabetes Father   . Heart disease Father   . Stroke Maternal Grandmother   . Cancer Maternal Grandmother     Social History:  reports that she has never smoked. She has never used smokeless tobacco. She reports that she drinks alcohol. She reports that she does not use drugs.  Review of Systems:  Hypertension:  She is on  Lopressor And amlodipine, followed by cardiologist She also thinks that she is having more swelling her legs  BP Readings from Last 3 Encounters:  08/03/17 138/82  06/02/17 124/74  06/02/17 118/76     Lipids: Has been treated  with Lipitor 80 mg because of history of CAD  Lab Results  Component Value Date   CHOL 124 10/28/2016   HDL 35 (L) 10/28/2016   LDLCALC 54 10/28/2016   TRIG 174 (H) 10/28/2016   CHOLHDL 3.5 10/28/2016      NEUROPATHY: She has tingling in her legs and some in her hands also She said that she is being treated  by PCP with maximum dose of gabapentin but having more symptoms recently  LABS:  Lab on 07/30/2017  Component Date Value Ref Range Status  . Hgb A1c MFr Bld 07/30/2017 8.2* 4.6 - 6.5 % Final   Glycemic Control Guidelines for People with Diabetes:Non Diabetic:  <6%Goal of Therapy: <7%Additional Action Suggested:  >8%   . Sodium 07/30/2017 136  135 - 145 mEq/L Final  . Potassium 07/30/2017 3.8  3.5 - 5.1 mEq/L Final  . Chloride 07/30/2017 103  96 - 112 mEq/L Final  . CO2 07/30/2017 28  19 - 32 mEq/L Final  . Glucose, Bld 07/30/2017 218* 70 - 99 mg/dL Final  . BUN 52/84/1324 16  6 - 23 mg/dL Final  . Creatinine, Ser 07/30/2017 0.85  0.40 - 1.20 mg/dL Final  . Total Bilirubin 07/30/2017 0.3  0.2 - 1.2 mg/dL Final  . Alkaline Phosphatase 07/30/2017 87  39 - 117 U/L Final  . AST 07/30/2017 21  0 - 37 U/L Final  . ALT 07/30/2017 22  0 - 35 U/L Final  . Total Protein 07/30/2017 7.1  6.0 - 8.3 g/dL Final  . Albumin 40/09/2724 3.9  3.5 - 5.2 g/dL Final  . Calcium 36/64/4034 9.2  8.4 - 10.5 mg/dL Final  . GFR 74/25/9563 97.39  >60.00 mL/min Final     Examination:   BP 138/82   Pulse 95   Ht 5' 7.5" (1.715 m)   Wt (!) 420 lb 9.6 oz (190.8 kg)   SpO2 97%   BMI 64.90 kg/m   Body mass index is 64.9 kg/m.   1+ ankle/leg edema present  ASSESSMENT/ PLAN:    Diabetes type 2:   See history of present illness for detailed discussion of current diabetes management, blood sugar patterns and problems identified  Blood glucose control has been better with adding Byetta to her regimen of Jardiance, Lantus and Humulin R U-500 but still not optimal A1c is still high at 8.2 even though her fructosamine was better last time  Again difficult to assess her blood sugar patterns since she did not bring her meter and typically does not monitor glucose adequately at various times as directed She is gaining a large amount of weight and not clear why Most likely can do better with her diet and she does  know which foods are making her blood sugars go up  RECOMMENDATIONS:  Follow diet recommended by dietitian and scheduled follow-up.  She will start taking 10 g of Byetta in the morning and 5 g before lunch  Stop taking Humulin R at bedtime  To start Byetta 5 g before lunch and dinner and not in the morning  Bring monitor for review on each visit  If she does tend to have low sugars with increasing Byetta she can reduce her Humulin regular insulin  Regular exercise.  NEUROPATHY: She says she is having more tingling despite taking maximum dose gabapentin Although she thinks Lyrica works better she probably will have weight gain and edema with this She will be given a trial of Cymbalta 30 mg daily at suppertime and  if this is effective in a couple of weeks she can start gradually cutting back on gabapentin  Edema and hypertension: She will discuss with her PCP this week   Patient Instructions  Take 2 shots Byetta in am  No Humulin at bedtime  Better diet, lo carbs  Cymbalta with food, can reduce Gabapentin  Counseling time on subjects discussed above is over 50% of today's 25 minute visit   Naila Elizondo 08/03/2017, 2:20 PM

## 2017-08-03 NOTE — Patient Instructions (Addendum)
Take 2 shots Byetta in am  No Humulin at bedtime  Better diet, lo carbs  Cymbalta with food, can reduce Gabapentin

## 2017-08-05 ENCOUNTER — Ambulatory Visit (INDEPENDENT_AMBULATORY_CARE_PROVIDER_SITE_OTHER): Payer: Medicaid Other | Admitting: Physician Assistant

## 2017-08-05 ENCOUNTER — Encounter (INDEPENDENT_AMBULATORY_CARE_PROVIDER_SITE_OTHER): Payer: Self-pay | Admitting: Physician Assistant

## 2017-08-05 DIAGNOSIS — M545 Low back pain, unspecified: Secondary | ICD-10-CM

## 2017-08-05 DIAGNOSIS — G8929 Other chronic pain: Secondary | ICD-10-CM

## 2017-08-05 NOTE — Progress Notes (Signed)
Subjective:  Patient ID: Lindsay [name], female    DOB: [birth year]  Age: 36 y.o. MRN: [id number]  CC: f/u weight   HPI Lindsay [name] is a 36 y.o. female with a PMH of asthma, acute renal failure, CAD, DM2, HLD, HTN, NSTEMI, PCI with DES stent, and Morbid obesity.  Dieting "is getting better", "weening off the cookies but it is hard though". Tries to exercise three days out of the week. Walks around in the house. Is still working on obtaining a [employer]. Reports having had Depo-Provera shots. Admits to having been advised that she may gain weight but was hoping she would not. Has gained 22 lbs from the last visit on 05/28/17. Interested in bariatric surgery. Does not endorse CP, palpitations, SOB, HA, abdominal pain, f/c/n/v, or GI/GU symptoms.      Pt complains of chronic LBP and left knee > right knee pain.      Outpatient Medications Prior to Visit  Medication Sig Dispense Refill  . ACCU-CHEK SOFTCLIX LANCETS lancets Use as instructed for 4 times daily blood glucose monitoring 100 each 12  . albuterol (PROVENTIL HFA;VENTOLIN HFA) 108 (90 Base) MCG/ACT inhaler Inhale 2 puffs into the lungs every 6 (six) hours as needed for wheezing or shortness of breath. 1 Inhaler 2  . amLODipine (NORVASC) 5 MG tablet Take 1 tablet (5 mg total) by mouth daily. 90 tablet 1  . aspirin 81 MG tablet Take 81 mg by mouth daily.    Marland Kitchen atorvastatin (LIPITOR) 80 MG tablet Take 1 tablet (80 mg total) by mouth daily at 6 PM. Please call and schedule November/December follow up. [phone] 90 tablet 1  . Blood Glucose Monitoring Suppl (ACCU-CHEK AVIVA PLUS) w/Device KIT Use as directed for 4 times daily blood glucose monitoring 1 kit 0  . diphenhydrAMINE (BENADRYL) 25 MG tablet Take 1 tablet (25 mg total) by mouth every 6 (six) hours as needed. 30 tablet 0  . DULoxetine (CYMBALTA) 30 MG capsule Take 1 capsule (30 mg total) by mouth daily. 30 capsule 3  . empagliflozin (JARDIANCE) 10 MG TABS tablet Take 10  mg by mouth daily with breakfast. (Patient not taking: Reported on 08/03/2017) 30 tablet 3  . exenatide (BYETTA 10 MCG PEN) 10 MCG/0.04ML SOPN injection Inject 0.04 mLs (10 mcg total) into the skin 2 (two) times daily with a meal. Take 30 minutes before breakfast and lunch 1 pen 2  . gabapentin (NEURONTIN) 300 MG capsule Take 2 capsules (600 mg total) by mouth 4 (four) times daily. 240 capsule 3  . glucose blood (ACCU-CHEK AVIVA PLUS) test strip Use as instructed for 4 times daily blood glucose monitoring 100 each 12  . hydrochlorothiazide (HYDRODIURIL) 25 MG tablet Take 1 tablet (25 mg total) by mouth daily. Take on tablet in the morning. 90 tablet 1  . Insulin Disposable Pump (V-GO 20) KIT Use 1 pod daily for insulin DX code- E11.40 (Patient not taking: Reported on 04/07/2017) 30 kit 12  . Insulin Glargine (LANTUS SOLOSTAR) 100 UNIT/ML Solostar Pen Inject 60 units at bedtime. (Patient taking differently: Inject 30 in the morning and 30 units at bedtime.) 10 pen 1  . Insulin Pen Needle 31G X 5 MM MISC 1 application by Does not apply route Nightly. 1000 each 3  . insulin regular human CONCENTRATED (HUMULIN R) 500 UNIT/ML injection 70 units 30 minutes before each meal; 50 at bedtime. 80 mL 2  . Insulin Syringe-Needle U-100 (BD INSULIN SYRINGE ULTRAFINE) 31G X 15/64" 1 ML  MISC Use to inject insulin daily 100 each 1  . Lancet Devices (ACCU-CHEK SOFTCLIX) lancets Use as instructed for 4 times daily blood glucose monitoring 1 each 0  . losartan (COZAAR) 25 MG tablet Take 1 tablet (25 mg total) by mouth daily. 90 tablet 1  . Magnesium 250 MG TABS Take 1 tablet by mouth 2 (two) times daily.    . Melatonin 10 MG TABS Take 3 tablets by mouth at bedtime.    . meloxicam (MOBIC) 15 MG tablet TAKE ONE TABLET BY MOUTH ONCE DAILY .MUST  HAVE  OFFICE  VISIT  FOR  REFILLS 30 tablet 1  . methocarbamol (ROBAXIN) 500 MG tablet Take 1 tablet (500 mg total) by mouth at bedtime. 30 tablet 1  . metoprolol (LOPRESSOR) 50 MG  tablet Take 1 tablet (50 mg total) by mouth 2 (two) times daily. 180 tablet 1  . Multiple Vitamins-Minerals (MULTIVITAMIN PO) Take 1 tablet by mouth daily.    . nitroGLYCERIN (NITROSTAT) 0.4 MG SL tablet Place 1 tablet (0.4 mg total) under the tongue every 5 (five) minutes as needed for chest pain. 25 tablet 12  . sodium chloride (OCEAN) 0.65 % SOLN nasal spray Place 1 spray into both nostrils as needed for congestion. 1 Bottle 0  . ticagrelor (BRILINTA) 90 MG TABS tablet Take 90 mg by mouth 2 (two) times daily.    . traMADol (ULTRAM) 50 MG tablet Take 2 tablets (100 mg total) by mouth every 8 (eight) hours as needed. 90 tablet 0   No facility-administered medications prior to visit.      ROS Review of Systems  Constitutional: Negative for chills, fever and malaise/fatigue.       Obesity  Eyes: Negative for blurred vision.  Respiratory: Negative for shortness of breath.   Cardiovascular: Negative for chest pain and palpitations.  Gastrointestinal: Negative for abdominal pain and nausea.  Genitourinary: Negative for dysuria and hematuria.  Musculoskeletal: Positive for back pain and joint pain. Negative for myalgias.  Skin: Negative for rash.  Neurological: Negative for tingling and headaches.  Psychiatric/Behavioral: Negative for depression. The patient is not nervous/anxious.     Objective:  BP (!) 154/93 (BP Location: Left Arm, Patient Position: Sitting, Cuff Size: Large)   Pulse 91   Temp 98.3 F (36.8 C) (Oral)   Wt (!) 421 lb 12.8 oz (191.3 kg)   SpO2 94%   BMI 65.09 kg/m   BP/Weight 08/05/2017 08/03/2017 5/36/4680  Systolic BP 321 224 825  Diastolic BP 93 82 74  Wt. (Lbs) 421.8 420.6 393.4  BMI 65.09 64.9 60.71      Physical Exam  Constitutional: She is oriented to person, place, and time.  Morbidly obese, NAD, polite  HENT:  Head: Normocephalic and atraumatic.  Eyes: No scleral icterus.  Neck: Normal range of motion. Neck supple. No thyromegaly present.   Cardiovascular: Normal rate, regular rhythm and normal heart sounds.   Pulmonary/Chest: Effort normal and breath sounds normal.  Abdominal: Soft.  Musculoskeletal: She exhibits no edema.  Neurological: She is alert and oriented to person, place, and time. No cranial nerve deficit. Coordination normal.  Skin: Skin is warm and dry. No rash noted. No erythema. No pallor.  Psychiatric: She has a normal mood and affect. Her behavior is normal. Thought content normal.  Vitals reviewed.    Assessment & Plan:    1. Morbid obesity (Kevin) - Amb Referral to Bariatric Surgery  2. Chronic bilateral low back pain without sciatica - Ambulatory referral to Pain  Clinic    Follow-up: Return in about 6 weeks (around 09/16/2017).   Clent Demark PA

## 2017-08-05 NOTE — Patient Instructions (Addendum)
[facility] in [place].  [address] [place] [address] [phone]   Obesity, Adult Obesity is the condition of having too much total body fat. Being overweight or obese means that your weight is greater than what is considered healthy for your body size. Obesity is determined by a measurement called BMI. BMI is an estimate of body fat and is calculated from height and weight. For adults, a BMI of 30 or higher is considered obese. Obesity can eventually lead to other health concerns and major illnesses, including:  Stroke.  Coronary artery disease (CAD).  Type 2 diabetes.  Some types of cancer, including cancers of the colon, breast, uterus, and gallbladder.  Osteoarthritis.  High blood pressure (hypertension).  High cholesterol.  Sleep apnea.  Gallbladder stones.  Infertility problems.  What are the causes? The main cause of obesity is taking in (consuming) more calories than your body uses for energy. Other factors that contribute to this condition may include:  Being born with genes that make you more likely to become obese.  Having a medical condition that causes obesity. These conditions include: ? Hypothyroidism. ? Polycystic ovarian syndrome (PCOS). ? Binge-eating disorder. ? Cushing syndrome.  Taking certain medicines, such as steroids, antidepressants, and seizure medicines.  Not being physically active (sedentary lifestyle).  Living where there are limited places to exercise safely or buy healthy foods.  Not getting enough sleep.  What increases the risk? The following factors may increase your risk of this condition:  Having a family history of obesity.  Being a woman of African-American descent.  Being a man of Hispanic descent.  What are the signs or symptoms? Having excessive body fat is the main symptom of this condition. How is this diagnosed? This condition may be diagnosed based on:  Your  symptoms.  Your medical history.  A physical exam. Your health care provider may measure: ? Your BMI. If you are an adult with a BMI between 25 and less than 30, you are considered overweight. If you are an adult with a BMI of 30 or higher, you are considered obese. ? The distances around your hips and your waist (circumferences). These may be compared to each other to help diagnose your condition. ? Your skinfold thickness. Your health care provider may gently pinch a fold of your skin and measure it.  How is this treated? Treatment for this condition often includes changing your lifestyle. Treatment may include some or all of the following:  Dietary changes. Work with your health care provider and a dietitian to set a weight-loss goal that is healthy and reasonable for you. Dietary changes may include eating: ? Smaller portions. A portion size is the amount of a particular food that is healthy for you to eat at one time. This varies from person to person. ? Low-calorie or low-fat options. ? More whole grains, fruits, and vegetables.  Regular physical activity. This may include aerobic activity (cardio) and strength training.  Medicine to help you lose weight. Your health care provider may prescribe medicine if you are unable to lose 1 pound a week after 6 weeks of eating more healthily and doing more physical activity.  Surgery. Surgical options may include gastric banding and gastric bypass. Surgery may be done if: ? Other treatments have not helped to improve your condition. ? You have a BMI of 40 or higher. ? You have life-threatening health problems related to obesity.  Follow these instructions at home:  Eating and drinking  Follow recommendations from your health care provider about what you eat and drink. Your health care provider may advise you to: ? Limit fast foods, sweets, and processed snack foods. ? Choose low-fat options, such as low-fat milk instead of whole  milk. ? Eat 5 or more servings of fruits or vegetables every day. ? Eat at home more often. This gives you more control over what you eat. ? Choose healthy foods when you eat out. ? Learn what a healthy portion size is. ? Keep low-fat snacks on hand. ? Avoid sugary drinks, such as soda, fruit juice, iced tea sweetened with sugar, and flavored milk. ? Eat a healthy breakfast.  Drink enough water to keep your urine clear or pale yellow.  Do not go without eating for long periods of time (do not fast) or follow a fad diet. Fasting and fad diets can be unhealthy and even dangerous. Physical Activity  Exercise regularly, as told by your health care provider. Ask your health care provider what types of exercise are safe for you and how often you should exercise.  Warm up and stretch before being active.  Cool down and stretch after being active.  Rest between periods of activity. Lifestyle  Limit the time that you spend in front of your TV, computer, or video game system.  Find ways to reward yourself that do not involve food.  Limit alcohol intake to no more than 1 drink a day for nonpregnant women and 2 drinks a day for men. One drink equals 12 oz of beer, 5 oz of wine, or 1 oz of hard liquor. General instructions  Keep a weight loss journal to keep track of the food you eat and how much you exercise you get.  Take over-the-counter and prescription medicines only as told by your health care provider.  Take vitamins and supplements only as told by your health care provider.  Consider joining a support group. Your health care provider may be able to recommend a support group.  Keep all follow-up visits as told by your health care provider. This is important. Contact a health care provider if:  You are unable to meet your weight loss goal after 6 weeks of dietary and lifestyle changes. This information is not intended to replace advice given to you by your health care provider.  Make sure you discuss any questions you have with your health care provider. Document Released: 01/14/2005 Document Revised: 05/11/2016 Document Reviewed: 09/25/2015 Elsevier Interactive Patient Education  2017 [employer].

## 2017-08-09 ENCOUNTER — Telehealth: Payer: Self-pay

## 2017-08-09 ENCOUNTER — Other Ambulatory Visit: Payer: Self-pay

## 2017-08-09 MED ORDER — EMPAGLIFLOZIN 10 MG PO TABS: 10.0000 mg | ORAL_TABLET | Freq: Every day | ORAL | 3 refills | Status: DC

## 2017-08-09 NOTE — Telephone Encounter (Signed)
Contacted [employer] to find out what is covered to take the place of jardiance- spoke to Annabella and she stated that jardiance is the preferred if the patient has tried and failed metformin containing product which she has- PA was approvoed from 08/09/17 to 08/04/18 and patient was notified

## 2017-08-09 NOTE — Telephone Encounter (Signed)
The PA number is [id number] for jardiance 10mg  tablets

## 2017-08-17 ENCOUNTER — Other Ambulatory Visit (INDEPENDENT_AMBULATORY_CARE_PROVIDER_SITE_OTHER): Payer: Self-pay | Admitting: Physician Assistant

## 2017-08-17 DIAGNOSIS — G8929 Other chronic pain: Secondary | ICD-10-CM

## 2017-08-17 DIAGNOSIS — M5442 Lumbago with sciatica, left side: Principal | ICD-10-CM

## 2017-08-17 NOTE — Telephone Encounter (Signed)
FWD to PCP. Ronit Cranfield S Tineka Uriegas, CMA  

## 2017-08-21 ENCOUNTER — Other Ambulatory Visit (INDEPENDENT_AMBULATORY_CARE_PROVIDER_SITE_OTHER): Payer: Self-pay | Admitting: Physician Assistant

## 2017-08-21 DIAGNOSIS — G8929 Other chronic pain: Secondary | ICD-10-CM

## 2017-08-21 DIAGNOSIS — M5442 Lumbago with sciatica, left side: Principal | ICD-10-CM

## 2017-08-24 NOTE — Telephone Encounter (Signed)
FWD to PCP. Tempestt S Roberts, CMA  

## 2017-08-25 ENCOUNTER — Other Ambulatory Visit: Payer: Self-pay | Admitting: Endocrinology

## 2017-08-25 ENCOUNTER — Other Ambulatory Visit: Payer: Self-pay | Admitting: Internal Medicine

## 2017-08-26 ENCOUNTER — Encounter: Payer: Medicaid Other | Admitting: Dietician

## 2017-08-30 ENCOUNTER — Telehealth (INDEPENDENT_AMBULATORY_CARE_PROVIDER_SITE_OTHER): Payer: Self-pay | Admitting: Physician Assistant

## 2017-08-30 NOTE — Telephone Encounter (Signed)
Tramadol and narcotics are no longer prescribed here for long term use. She will have to find a pain clinic.

## 2017-08-30 NOTE — Telephone Encounter (Signed)
Patient is requesting a refill on tramadol, please advise.

## 2017-08-30 NOTE — Telephone Encounter (Signed)
Patient called requesting  A Refill of Robaxin 500mg   And traMADol (ULTRAM) 50 MG tablet . Patient use [facility] at [address]

## 2017-08-31 DIAGNOSIS — E1140 Type 2 diabetes mellitus with diabetic neuropathy, unspecified: Secondary | ICD-10-CM | POA: Diagnosis not present

## 2017-08-31 DIAGNOSIS — Z794 Long term (current) use of insulin: Secondary | ICD-10-CM | POA: Diagnosis not present

## 2017-08-31 DIAGNOSIS — E669 Obesity, unspecified: Secondary | ICD-10-CM | POA: Diagnosis not present

## 2017-08-31 DIAGNOSIS — Z6841 Body Mass Index (BMI) 40.0 and over, adult: Secondary | ICD-10-CM | POA: Diagnosis not present

## 2017-08-31 DIAGNOSIS — E1165 Type 2 diabetes mellitus with hyperglycemia: Secondary | ICD-10-CM | POA: Insufficient documentation

## 2017-08-31 DIAGNOSIS — IMO0002 Reserved for concepts with insufficient information to code with codable children: Secondary | ICD-10-CM | POA: Insufficient documentation

## 2017-09-02 ENCOUNTER — Encounter: Payer: Self-pay | Admitting: Physical Medicine & Rehabilitation

## 2017-09-07 ENCOUNTER — Other Ambulatory Visit (INDEPENDENT_AMBULATORY_CARE_PROVIDER_SITE_OTHER): Payer: Self-pay | Admitting: Physician Assistant

## 2017-09-07 DIAGNOSIS — G8929 Other chronic pain: Secondary | ICD-10-CM

## 2017-09-07 DIAGNOSIS — M5442 Lumbago with sciatica, left side: Principal | ICD-10-CM

## 2017-09-07 NOTE — Telephone Encounter (Signed)
FWD To PCP. Maryjean Morn, CMA

## 2017-09-09 ENCOUNTER — Telehealth (INDEPENDENT_AMBULATORY_CARE_PROVIDER_SITE_OTHER): Payer: Self-pay | Admitting: Physician Assistant

## 2017-09-09 NOTE — Telephone Encounter (Signed)
Patient aware that rx will not be refilled. Lindsay [name], CMA

## 2017-09-09 NOTE — Telephone Encounter (Signed)
Pt called to request a refill for methocarbamol (ROBAXIN) 500 MG tablet  Please sent it to the [facility] pharmacy on file Please follow up

## 2017-09-17 ENCOUNTER — Other Ambulatory Visit: Payer: Self-pay

## 2017-09-17 ENCOUNTER — Telehealth: Payer: Self-pay | Admitting: Endocrinology

## 2017-09-17 MED ORDER — GLUCOSE BLOOD VI STRP: ORAL_STRIP | 12 refills | Status: DC

## 2017-09-17 NOTE — Telephone Encounter (Signed)
Called patient and let her know that I have sent over her test strips to the pharmacy.

## 2017-09-17 NOTE — Telephone Encounter (Signed)
Pt needs refills on test strips called into [facility] please

## 2017-09-20 ENCOUNTER — Encounter: Payer: Self-pay | Admitting: Physical Medicine & Rehabilitation

## 2017-09-20 ENCOUNTER — Ambulatory Visit (HOSPITAL_BASED_OUTPATIENT_CLINIC_OR_DEPARTMENT_OTHER): Payer: Medicaid Other | Admitting: Physical Medicine & Rehabilitation

## 2017-09-20 ENCOUNTER — Encounter (INDEPENDENT_AMBULATORY_CARE_PROVIDER_SITE_OTHER): Payer: Self-pay

## 2017-09-20 ENCOUNTER — Encounter: Payer: Medicaid Other | Attending: Physical Medicine & Rehabilitation

## 2017-09-20 VITALS — BP 139/84 | HR 74

## 2017-09-20 DIAGNOSIS — I214 Non-ST elevation (NSTEMI) myocardial infarction: Secondary | ICD-10-CM | POA: Insufficient documentation

## 2017-09-20 DIAGNOSIS — E785 Hyperlipidemia, unspecified: Secondary | ICD-10-CM | POA: Insufficient documentation

## 2017-09-20 DIAGNOSIS — Z809 Family history of malignant neoplasm, unspecified: Secondary | ICD-10-CM | POA: Insufficient documentation

## 2017-09-20 DIAGNOSIS — M25561 Pain in right knee: Secondary | ICD-10-CM | POA: Diagnosis not present

## 2017-09-20 DIAGNOSIS — G8929 Other chronic pain: Secondary | ICD-10-CM | POA: Insufficient documentation

## 2017-09-20 DIAGNOSIS — M545 Low back pain: Secondary | ICD-10-CM

## 2017-09-20 DIAGNOSIS — Z9889 Other specified postprocedural states: Secondary | ICD-10-CM | POA: Diagnosis not present

## 2017-09-20 DIAGNOSIS — E119 Type 2 diabetes mellitus without complications: Secondary | ICD-10-CM | POA: Diagnosis not present

## 2017-09-20 DIAGNOSIS — I10 Essential (primary) hypertension: Secondary | ICD-10-CM | POA: Insufficient documentation

## 2017-09-20 DIAGNOSIS — M25562 Pain in left knee: Secondary | ICD-10-CM | POA: Diagnosis not present

## 2017-09-20 DIAGNOSIS — Z8249 Family history of ischemic heart disease and other diseases of the circulatory system: Secondary | ICD-10-CM | POA: Insufficient documentation

## 2017-09-20 DIAGNOSIS — I2510 Atherosclerotic heart disease of native coronary artery without angina pectoris: Secondary | ICD-10-CM | POA: Diagnosis not present

## 2017-09-20 DIAGNOSIS — Z955 Presence of coronary angioplasty implant and graft: Secondary | ICD-10-CM | POA: Diagnosis not present

## 2017-09-20 DIAGNOSIS — J45909 Unspecified asthma, uncomplicated: Secondary | ICD-10-CM | POA: Insufficient documentation

## 2017-09-20 DIAGNOSIS — M25511 Pain in right shoulder: Secondary | ICD-10-CM

## 2017-09-20 DIAGNOSIS — E669 Obesity, unspecified: Secondary | ICD-10-CM | POA: Insufficient documentation

## 2017-09-20 DIAGNOSIS — Z823 Family history of stroke: Secondary | ICD-10-CM | POA: Diagnosis not present

## 2017-09-20 DIAGNOSIS — Z833 Family history of diabetes mellitus: Secondary | ICD-10-CM | POA: Diagnosis not present

## 2017-09-20 MED ORDER — DICLOFENAC SODIUM 1 % TD GEL: 2.0000 g | Freq: Four times a day (QID) | TRANSDERMAL | 1 refills | Status: DC

## 2017-09-20 MED ORDER — TRAMADOL HCL 50 MG PO TABS: 50.0000 mg | ORAL_TABLET | Freq: Two times a day (BID) | ORAL | 1 refills | Status: DC

## 2017-09-20 MED ORDER — METHOCARBAMOL 750 MG PO TABS: 750.0000 mg | ORAL_TABLET | Freq: Three times a day (TID) | ORAL | 1 refills | Status: DC
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
## 2017-09-20 NOTE — Patient Instructions (Signed)
Will do knee x-rays bilaterally  Restart tramadol twice a day  Robaxin 750 3 times a day, and do not plan to keep you on this long-term, but only until we can do the injections

## 2017-09-24 ENCOUNTER — Ambulatory Visit (INDEPENDENT_AMBULATORY_CARE_PROVIDER_SITE_OTHER): Payer: Medicaid Other | Admitting: Physician Assistant

## 2017-09-27 ENCOUNTER — Other Ambulatory Visit: Payer: Self-pay | Admitting: *Deleted

## 2017-09-27 MED ORDER — DICLOFENAC SODIUM 1 % TD GEL: 2.0000 g | Freq: Four times a day (QID) | TRANSDERMAL | 1 refills | Status: DC

## 2017-09-29 ENCOUNTER — Ambulatory Visit (HOSPITAL_COMMUNITY)
Admission: RE | Admit: 2017-09-29 | Discharge: 2017-09-29 | Disposition: A | Discharge status: Home / Self Care | Payer: Medicaid Other | Source: Ambulatory Visit | Attending: Physical Medicine & Rehabilitation | Admitting: Physical Medicine & Rehabilitation

## 2017-09-29 DIAGNOSIS — M25561 Pain in right knee: Secondary | ICD-10-CM | POA: Diagnosis not present

## 2017-09-29 DIAGNOSIS — G8929 Other chronic pain: Secondary | ICD-10-CM | POA: Diagnosis present

## 2017-09-29 DIAGNOSIS — M25562 Pain in left knee: Secondary | ICD-10-CM | POA: Insufficient documentation

## 2017-10-04 ENCOUNTER — Other Ambulatory Visit: Payer: Medicaid Other

## 2017-10-07 ENCOUNTER — Ambulatory Visit: Payer: Medicaid Other | Admitting: Endocrinology

## 2017-10-11 ENCOUNTER — Ambulatory Visit (HOSPITAL_BASED_OUTPATIENT_CLINIC_OR_DEPARTMENT_OTHER): Payer: Medicaid Other | Admitting: Physical Medicine & Rehabilitation

## 2017-10-11 ENCOUNTER — Encounter: Payer: Self-pay | Admitting: Physical Medicine & Rehabilitation

## 2017-10-11 VITALS — BP 163/98 | HR 91 | Resp 14

## 2017-10-11 DIAGNOSIS — M47816 Spondylosis without myelopathy or radiculopathy, lumbar region: Secondary | ICD-10-CM

## 2017-10-11 DIAGNOSIS — G8929 Other chronic pain: Secondary | ICD-10-CM | POA: Diagnosis not present

## 2017-10-11 NOTE — Progress Notes (Signed)
PROCEDURE RECORD [facility]   Name: Lindsay [name] DOB:[birth year] MRN: [id number]  Date:10/11/2017  Physician: Claudette Laws, MD    Nurse/CMA: Eain Mullendore, CMA  Allergies:  Allergies  Allergen Reactions  . Hydrazine Yellow [Tartrazine] Shortness Of Breath and Swelling    Swelling mostly noticed in legs and feet, retaining urination, shortness of breaht, and minor chest pain  . Lisinopril Shortness Of Breath    Was on prinzide; had sob/chest pain on it.  . Tylenol [Acetaminophen] Itching and Swelling    Itching of the mouth, swelling of tongue and stomach started hurting    Consent Signed: Yes.    Is patient diabetic? Yes.    CBG today? 100  10/10/2017  Pregnant: No. LMP: No LMP recorded. Patient is not currently having periods (Reason: Irregular Periods). (age 65-55)  Anticoagulants: no Anti-inflammatory: no Antibiotics: no  Procedure: bilateral medial branch block  Position: Prone Start Time: 12:16pm   End Time: 12:36pm  Fluoro Time: 1:53  RN/CMA Reggie Welge, CMA Kameren Pargas, CMA    Time 11:45am 12:40pm    BP 163/98 158/98    Pulse 91 97    Respirations 14 14    O2 Sat 94 91    S/S 6 6    Pain Level 9/10 6/10     D/C home with Friend, patient A & O X 3, D/C instructions reviewed, and sits independently.

## 2017-10-11 NOTE — Progress Notes (Signed)
Bilateral Lumbar L3, L4  medial branch blocks and L 5 dorsal ramus injection under fluoroscopic guidance  Indication: Lumbar pain which is not relieved by medication management or other conservative care and interfering with self-care and mobility.  Informed consent was obtained after describing risks and benefits of the procedure with the patient, this includes bleeding, infection, paralysis and medication side effects.  The patient wishes to proceed and has given written consent.  The patient was placed in prone position.  The lumbar area was marked and prepped with Betadine.  One mL of 1% lidocaine was injected into each of 6 areas into the skin and subcutaneous tissue.  Then a 22-gauge 7in spinal needle was inserted targeting the junction of the left S1 superior articular process and sacral ala junction. Needle was advanced under fluoroscopic guidance.  Bone contact was made.  Isovue 200 was injected x 0.5 mL demonstrating no intravascular uptake.  Then a solution  of 2% MPF lidocaine was injected x 0.5 mL.  Then the left L5 superior articular process in transverse process junction was targeted.  Bone contact was made.  Isovue 200 was injected x 0.5 mL demonstrating no intravascular uptake. Then a solution containing  2% MPF lidocaine was injected x 0.5 mL.  Then the left L4 superior articular process in transverse process junction was targeted.  Bone contact was made.  Isovue 200 was injected x 0.5 mL demonstrating no intravascular uptake.  Then a solution containing2% MPF lidocaine was injected x 0.5 mL.  This same procedure was performed on the right side using the same needle, technique and injectate.  Patient tolerated procedure well.  Post procedure instructions were given.

## 2017-10-11 NOTE — Patient Instructions (Signed)
May resume brillinta tonite

## 2017-10-14 ENCOUNTER — Encounter: Payer: Self-pay | Admitting: Interventional Cardiology

## 2017-10-14 ENCOUNTER — Other Ambulatory Visit: Payer: Self-pay | Admitting: *Deleted

## 2017-10-14 ENCOUNTER — Ambulatory Visit (INDEPENDENT_AMBULATORY_CARE_PROVIDER_SITE_OTHER): Payer: Medicaid Other | Admitting: Interventional Cardiology

## 2017-10-14 VITALS — BP 144/88 | HR 87 | Ht 67.5 in | Wt >= 6400 oz

## 2017-10-14 DIAGNOSIS — G4733 Obstructive sleep apnea (adult) (pediatric): Secondary | ICD-10-CM

## 2017-10-14 DIAGNOSIS — I10 Essential (primary) hypertension: Secondary | ICD-10-CM

## 2017-10-14 DIAGNOSIS — E118 Type 2 diabetes mellitus with unspecified complications: Secondary | ICD-10-CM

## 2017-10-14 DIAGNOSIS — I25119 Atherosclerotic heart disease of native coronary artery with unspecified angina pectoris: Secondary | ICD-10-CM

## 2017-10-14 DIAGNOSIS — E785 Hyperlipidemia, unspecified: Secondary | ICD-10-CM

## 2017-10-14 DIAGNOSIS — I2510 Atherosclerotic heart disease of native coronary artery without angina pectoris: Secondary | ICD-10-CM

## 2017-10-14 LAB — HEPATIC FUNCTION PANEL
ALT: 31 IU/L (ref 0–32)
AST: 23 IU/L (ref 0–40)
Albumin: 4.3 g/dL (ref 3.5–5.5)
Alkaline Phosphatase: 131 IU/L — ABNORMAL HIGH (ref 39–117)
BILIRUBIN, DIRECT: 0.12 mg/dL (ref 0.00–0.40)
Bilirubin Total: 0.3 mg/dL (ref 0.0–1.2)
Total Protein: 7.4 g/dL (ref 6.0–8.5)

## 2017-10-14 LAB — LIPID PANEL
CHOL/HDL RATIO: 3.3 ratio (ref 0.0–4.4)
Cholesterol, Total: 125 mg/dL (ref 100–199)
HDL: 38 mg/dL — AB (ref >39)
LDL CALC: 56 mg/dL (ref 0–99)
TRIGLYCERIDES: 155 mg/dL — AB (ref 0–149)
VLDL CHOLESTEROL CAL: 31 mg/dL (ref 5–40)

## 2017-10-14 MED ORDER — NITROGLYCERIN 0.4 MG SL SUBL: 0.4000 mg | SUBLINGUAL_TABLET | SUBLINGUAL | 3 refills | Status: DC | PRN

## 2017-10-14 MED ORDER — DICLOFENAC SODIUM 1 % TD GEL: 2.0000 g | Freq: Four times a day (QID) | TRANSDERMAL | 1 refills | Status: DC

## 2017-10-14 NOTE — Progress Notes (Signed)
Cardiology Office Note    Date:  10/14/2017   ID:  ARLYCE CIRCLE, DOB 09-30-1981, MRN [id number]  PCP:  Clent Demark, PA-C  Cardiologist: Sinclair Grooms, MD   Chief Complaint  Patient presents with  . Coronary Artery Disease    History of Present Illness:  Lindsay [name] is a 36 y.o. female  follow up for NSTEMI September 2017, with pk troponin 6.64 with cath and PCI with DES to mLAD and PTCA only to 2nd diag ostium. Still with 50% residual stenosis.  EF with cath 35-45%but Echo was 55-60%.      She is now enrolled in the bariatric clinic. She doesn't sleep well. Over the past 12 months she has had occasional fleeting chest discomfort that lasts seconds and resolves. She has not had sustained discomfort as she did prior/during non-ST elevation MI. She has been unable to lose weight.   Past Medical History:  Diagnosis Date  . ARF (acute renal failure) ([facility]) 04/2015  . Asthma   . Cellulitis of right upper extremity   . Coronary artery disease   . Diabetes mellitus    insulin dependent  . Hyperlipidemia LDL goal <70   . Hypertension   . NSTEMI (non-ST elevated myocardial infarction) ([place] [facility]) 08/2016  . Obesity   . S/P angioplasty with stent 08/2016   DES to mLAD and PTCA only to 2nd diag ostium.     Past Surgical History:  Procedure Laterality Date  . CARDIAC CATHETERIZATION N/A 09/07/2016   Procedure: Left Heart Cath and Coronary Angiography;  Surgeon: [name], MD;  Location: [facility];  Service: Cardiovascular;  Laterality: N/A;  . CARDIAC CATHETERIZATION N/A 09/07/2016   Procedure: Coronary Stent Intervention;  Surgeon: [name], MD;  Location: [facility];  Service: Cardiovascular;  Laterality: N/A;  . CARDIAC CATHETERIZATION N/A 09/07/2016   Procedure: Coronary Balloon Angioplasty;  Surgeon: [name], MD;  Location: [facility];  Service: Cardiovascular;  Laterality: N/A;  . CESAREAN SECTION    . IRRIGATION AND  DEBRIDEMENT SHOULDER Right 04/30/2015   Procedure: IRRIGATION AND DEBRIDEMENT SHOULDER;  Surgeon: Renette Butters, MD;  Location: [facility];  Service: Orthopedics;  Laterality: Right;  . IRRIGATION AND DEBRIDEMENT SHOULDER Right 05/01/2015  . LEG SURGERY    . SHOULDER ARTHROSCOPY Right 04/30/2015   Procedure: ARTHROSCOPY SHOULDER;  Surgeon: Renette Butters, MD;  Location: [facility];  Service: Orthopedics;  Laterality: Right;  . TONSILLECTOMY      Current Medications: Outpatient Medications Prior to Visit  Medication Sig Dispense Refill  . ACCU-CHEK FASTCLIX LANCETS MISC USE 1  TO CHECK GLUCOSE 4 TIMES DAILY 102 each 2  . albuterol (PROVENTIL HFA;VENTOLIN HFA) 108 (90 Base) MCG/ACT inhaler Inhale 2 puffs into the lungs every 6 (six) hours as needed for wheezing or shortness of breath. 1 Inhaler 2  . amLODipine (NORVASC) 5 MG tablet Take 1 tablet (5 mg total) by mouth daily. 90 tablet 1  . aspirin 81 MG tablet Take 81 mg by mouth daily.    Marland Kitchen atorvastatin (LIPITOR) 80 MG tablet Take 1 tablet (80 mg total) by mouth daily at 6 PM. Please call and schedule November/December follow up. [phone] 90 tablet 1  . BD VEO INSULIN SYR ULTRAFINE 31G X 15/64" 1 ML MISC USE  SYRINGE ONCE DAILY 100 each 1  . Blood Glucose Monitoring Suppl (ACCU-CHEK AVIVA PLUS) w/Device KIT Use as directed for 4 times daily blood glucose  monitoring 1 kit 0  . diclofenac sodium (VOLTAREN) 1 % GEL Apply 2 g topically 4 (four) times daily. 3 Tube 1  . diphenhydrAMINE (BENADRYL) 25 MG tablet Take 1 tablet (25 mg total) by mouth every 6 (six) hours as needed. 30 tablet 0  . DULoxetine (CYMBALTA) 30 MG capsule Take 1 capsule (30 mg total) by mouth daily. 30 capsule 3  . empagliflozin (JARDIANCE) 10 MG TABS tablet Take 10 mg by mouth daily with breakfast. 30 tablet 3  . exenatide (BYETTA 10 MCG PEN) 10 MCG/0.04ML SOPN injection Inject 0.04 mLs (10 mcg total) into the skin 2 (two) times daily with a meal. Take 30 minutes before breakfast  and lunch 1 pen 2  . gabapentin (NEURONTIN) 300 MG capsule Take 2 capsules (600 mg total) by mouth 4 (four) times daily. 240 capsule 3  . glucose blood (ACCU-CHEK AVIVA PLUS) test strip Use as instructed for 4 times daily blood glucose monitoring 100 each 12  . hydrochlorothiazide (HYDRODIURIL) 25 MG tablet Take 1 tablet (25 mg total) by mouth daily. Take on tablet in the morning. 90 tablet 1  . Insulin Glargine (LANTUS SOLOSTAR) 100 UNIT/ML Solostar Pen Inject 60 units at bedtime. (Patient taking differently: Inject 30 in the morning and 30 units at bedtime.) 10 pen 1  . losartan (COZAAR) 25 MG tablet Take 1 tablet (25 mg total) by mouth daily. 90 tablet 1  . Magnesium 250 MG TABS Take 1 tablet by mouth 2 (two) times daily.    . Melatonin 10 MG TABS Take 3 tablets by mouth at bedtime.    . meloxicam (MOBIC) 15 MG tablet TAKE ONE TABLET BY MOUTH ONCE DAILY .MUST  HAVE  OFFICE  VISIT  FOR  REFILLS 30 tablet 1  . methocarbamol (ROBAXIN-750) 750 MG tablet Take 1 tablet (750 mg total) by mouth 3 (three) times daily. 90 tablet 1  . metoprolol (LOPRESSOR) 50 MG tablet Take 1 tablet (50 mg total) by mouth 2 (two) times daily. 180 tablet 1  . Multiple Vitamins-Minerals (MULTIVITAMIN PO) Take 1 tablet by mouth daily.    . nitroGLYCERIN (NITROSTAT) 0.4 MG SL tablet Place 1 tablet (0.4 mg total) under the tongue every 5 (five) minutes as needed for chest pain. 25 tablet 12  . sodium chloride (OCEAN) 0.65 % SOLN nasal spray Place 1 spray into both nostrils as needed for congestion. 1 Bottle 0  . traMADol (ULTRAM) 50 MG tablet Take 1 tablet (50 mg total) by mouth 2 (two) times daily. 60 tablet 1  . ticagrelor (BRILINTA) 90 MG TABS tablet Take 90 mg by mouth 2 (two) times daily.     No facility-administered medications prior to visit.      Allergies:   Hydrazine yellow [tartrazine]; Lisinopril; and Tylenol [acetaminophen]   Social History   Social History  . Marital status: Single    Spouse name: N/A  .  Number of children: N/A  . Years of education: N/A   Social History Main Topics  . Smoking status: Never Smoker  . Smokeless tobacco: Never Used  . Alcohol use Yes     Comment: socially  . Drug use: No  . Sexual activity: Not Asked   Other Topics Concern  . None   Social History Narrative  . None     Family History:  The patient's family history includes Cancer in her maternal grandmother; Diabetes in her father and mother; Heart disease in her father and mother; Hypertension in her mother; Stroke in  her maternal grandmother.   ROS:   Please see the history of present illness.    Change in appetite, chest pain, leg swelling, waking up at night short of breath (has had a sleep study in the past and was unremarkable), dyspnea on exertion, wheezing, back pain, anxiety.  All other systems reviewed and are negative.   PHYSICAL EXAM:   VS:  BP (!) 144/88 (BP Location: Right Arm)   Pulse 87   Ht 5' 7.5" (1.715 m)   Wt (!) 402 lb (182.3 kg)   BMI 62.03 kg/m    GEN: Well nourished, well developed, in no acute distress . Morbid obesity. HEENT: normal  Neck: no JVD, carotid bruits, or masses Cardiac: RRR; no murmurs, rubs, or gallops,no edema  Respiratory:  clear to auscultation bilaterally, normal work of breathing GI: soft, nontender, nondistended, + BS MS: no deformity or atrophy  Skin: warm and dry, no rash Neuro:  Alert and Oriented x 3, Strength and sensation are intact Psych: euthymic mood, full affect  Wt Readings from Last 3 Encounters:  10/14/17 (!) 402 lb (182.3 kg)  08/05/17 (!) 421 lb 12.8 oz (191.3 kg)  08/03/17 (!) 420 lb 9.6 oz (190.8 kg)      Studies/Labs Reviewed:   EKG:  EKG  Normal sinus rhythm with nonspecific T-wave flattening.  Recent Labs: 05/28/2017: TSH 2.930 07/30/2017: ALT 22; BUN 16; Creatinine, Ser 0.85; Potassium 3.8; Sodium 136   Lipid Panel    Component Value Date/Time   CHOL 124 10/28/2016 0856   TRIG 174 (H) 10/28/2016 0856   HDL 35  (L) 10/28/2016 0856   CHOLHDL 3.5 10/28/2016 0856   VLDL 35 (H) 10/28/2016 0856   LDLCALC 54 10/28/2016 0856    Additional studies/ records that were reviewed today include:  Coronary angiography 2017:  Coronary Diagrams  Diagnostic Diagram      Post-Intervention Diagram           ASSESSMENT:    1. Coronary artery disease involving native coronary artery of native heart with angina pectoris ([place])   2. Essential hypertension   3. Hyperlipidemia LDL goal <70   4. Diabetes mellitus with complication ([place])   5. OSA (obstructive sleep apnea)   6. CAD in native artery      PLAN:  In order of problems listed above:  1. Infrequent recurrent brief episodes of chest pain or not ischemia related. Discontinue Brilinta. Continue 81 mg of aspirin per day. 2. Target 130/85 mmHg a less. We discussed weight loss, and salt restriction. 3. The LDL target is less than 70. 4. Low carbohydrate diet. Discussed A1c target less than 7. 5. Recommended bariatric clinic which she is now enrolled then. Morbid obesity as her major comorbidity.  Aggressive risk factor control as noted above. Clinical follow-up in one year. Brilinta has been discontinued.  Medication Adjustments/Labs and Tests Ordered: Current medicines are reviewed at length with the patient today.  Concerns regarding medicines are outlined above.  Medication changes, Labs and Tests ordered today are listed in the Patient Instructions below. Patient Instructions  Medication Instructions:  1) DISCONTINUE Brilinta  Labwork: Lipid and liver today  Testing/Procedures: None  Follow-Up: Your physician wants you to follow-up in: 1 year with Dr. [name].  You will receive a reminder letter in the mail two months in advance. If you don't receive a letter, please call our office to schedule the follow-up appointment.   Any Other Special Instructions Will Be Listed Below (If Applicable).  If you need a refill on your cardiac  medications before your next appointment, please call your pharmacy.      Signed, Sinclair Grooms, MD  10/14/2017 10:14 AM    [facility] [place], [place], [place]  [address] Phone: [phone]; Fax: [phone]

## 2017-10-14 NOTE — Patient Instructions (Signed)
Medication Instructions:  1) DISCONTINUE Brilinta  Labwork: Lipid and liver today  Testing/Procedures: None  Follow-Up: Your physician wants you to follow-up in: 1 year with Dr. Katrinka Blazing.  You will receive a reminder letter in the mail two months in advance. If you don't receive a letter, please call our office to schedule the follow-up appointment.   Any Other Special Instructions Will Be Listed Below (If Applicable).     If you need a refill on your cardiac medications before your next appointment, please call your pharmacy.

## 2017-10-14 NOTE — Addendum Note (Signed)
Addended by: Julio Sicks on: 10/14/2017 01:09 PM   Modules accepted: Orders

## 2017-10-15 ENCOUNTER — Telehealth: Payer: Self-pay | Admitting: Interventional Cardiology

## 2017-10-15 DIAGNOSIS — E785 Hyperlipidemia, unspecified: Secondary | ICD-10-CM

## 2017-10-15 NOTE — Telephone Encounter (Signed)
Notes recorded by Lyn Records, MD on 10/14/2017 at 5:22 PM EDT Let the patient know labs are stable. Cholesterol is at target, 56. Repeat liver and lipid in one year.  Pt notified of results.  A copy will be sent to Loletta Specter, PA-C Lipid and liver panel to ordered to be done in 1 year.

## 2017-10-22 ENCOUNTER — Other Ambulatory Visit (INDEPENDENT_AMBULATORY_CARE_PROVIDER_SITE_OTHER): Payer: Self-pay | Admitting: Physician Assistant

## 2017-10-22 DIAGNOSIS — M5442 Lumbago with sciatica, left side: Principal | ICD-10-CM

## 2017-10-22 DIAGNOSIS — G8929 Other chronic pain: Secondary | ICD-10-CM

## 2017-10-25 NOTE — Telephone Encounter (Signed)
FWD to PCP. Nishka Heide S Jayra Choyce, CMA  

## 2017-10-29 ENCOUNTER — Other Ambulatory Visit: Payer: Medicaid Other

## 2017-11-02 ENCOUNTER — Encounter: Payer: Self-pay | Admitting: Endocrinology

## 2017-11-02 ENCOUNTER — Ambulatory Visit: Payer: Medicaid Other | Admitting: Endocrinology

## 2017-11-02 NOTE — Progress Notes (Deleted)
Patient ID: BHAVINI PIRRELLO, female   DOB: 04/01/1981, 36 y.o.   MRN: [id number]            Reason for Appointment: Type II Diabetes, follow-up    History of Present Illness   Diagnosis date for diabetes: 2004  Previous history:  Was started on insulin about 2008 No prior records are available  Recent history:  Non-insulin hypoglycemic drugs: Byetta 5 g in a.m., Jardiance 10 mg daily  Insulin regimen: Lantus 30 bid, HUMULIN R U-500 with syringe: 5-12 ac and 5 at bedtime  Her A1c is 8.2, previously A1c 9.3  Because of her poor control she was given Byetta in addition to her basal bolus insulin on her  visit in May  Current management, blood sugar patterns and problems identified:  She has not brought her monitor for download today  Although she was told to take Byetta before breakfast and lunch she still is doing it only before breakfast  Glucose in the lab after breakfast was 218  She says that blood sugar will go up after meals especially breakfast and lunch if she is eating a large amount of carbohydrate or cereal in the morning  She cannot adequately explain why her weight has gone up tremendously since her last visit, previously had difficulty with portion control  She is not doing much physical activity  Although she was told to stop Humulin R at bedtime she is still taking this and occasionally will have hypoglycemia waking up with blood sugars in the 60s  Blood sugars are mostly high after breakfast or lunch and probably not after supper by recall         Side effects from medications: None        Proper timing of medications in relation to meals: Yes.          Monitors blood glucose: Once or twice a day.    Glucometer:  Accu-Chek  Blood Glucose readings by recall  Mean values apply above for all meters except median for One Touch  PRE-MEAL Fasting Lunch Dinner Bedtime Overall  Glucose range: 68-120      Mean/median:        POST-MEAL PC Breakfast PC  Lunch PC Dinner  Glucose range: upto 215 Max 245 150  Mean/median:               Meals: 3 meals per day. Drinks water, Avoiding drinks with sugar Variable breakfast, sometimes boiled eggs and toast, lunch 1-2 p.m. supper variable          Physical activity: exercise: walking irregularly            Dietician visit: Most recent: 2/18     Weight control:  Wt Readings from Last 3 Encounters:  10/14/17 (!) 402 lb (182.3 kg)  08/05/17 (!) 421 lb 12.8 oz (191.3 kg)  08/03/17 (!) 420 lb 9.6 oz (190.8 kg)           Diabetes labs:  Lab Results  Component Value Date   HGBA1C 8.2 (H) 07/30/2017   HGBA1C 9.3 04/13/2017   HGBA1C 12.5 01/18/2017   Lab Results  Component Value Date   MICROALBUR 9.6 (H) 02/18/2017   LDLCALC 56 10/14/2017   CREATININE 0.85 07/30/2017     Allergies as of 11/02/2017      Reactions   Hydrazine Yellow [tartrazine] Shortness Of Breath, Swelling   Swelling mostly noticed in legs and feet, retaining urination, shortness of breaht, and minor chest pain  Lisinopril Shortness Of Breath   Was on prinzide; had sob/chest pain on it.   Tylenol [acetaminophen] Itching, Swelling   Itching of the mouth, swelling of tongue and stomach started hurting      Medication List        Accurate as of 11/02/17  8:08 AM. Always use your most recent med list.          ACCU-CHEK AVIVA PLUS w/Device Kit Use as directed for 4 times daily blood glucose monitoring   ACCU-CHEK FASTCLIX LANCETS Misc USE 1  TO CHECK GLUCOSE 4 TIMES DAILY   albuterol 108 (90 Base) MCG/ACT inhaler Commonly known as:  PROVENTIL HFA;VENTOLIN HFA Inhale 2 puffs into the lungs every 6 (six) hours as needed for wheezing or shortness of breath.   amLODipine 5 MG tablet Commonly known as:  NORVASC Take 1 tablet (5 mg total) by mouth daily.   aspirin 81 MG tablet Take 81 mg by mouth daily.   atorvastatin 80 MG tablet Commonly known as:  LIPITOR Take 1 tablet (80 mg total) by mouth daily  at 6 PM. Please call and schedule November/December follow up. [phone]   BD VEO INSULIN SYR ULTRAFINE 31G X 15/64" 1 ML Misc Generic drug:  Insulin Syringe-Needle U-100 USE  SYRINGE ONCE DAILY   diclofenac sodium 1 % Gel Commonly known as:  VOLTAREN Apply 2 g topically 4 (four) times daily.   diphenhydrAMINE 25 MG tablet Commonly known as:  BENADRYL Take 1 tablet (25 mg total) by mouth every 6 (six) hours as needed.   DULoxetine 30 MG capsule Commonly known as:  CYMBALTA Take 1 capsule (30 mg total) by mouth daily.   empagliflozin 10 MG Tabs tablet Commonly known as:  JARDIANCE Take 10 mg by mouth daily with breakfast.   exenatide 10 MCG/0.04ML Sopn injection Commonly known as:  BYETTA 10 MCG PEN Inject 0.04 mLs (10 mcg total) into the skin 2 (two) times daily with a meal. Take 30 minutes before breakfast and lunch   gabapentin 300 MG capsule Commonly known as:  NEURONTIN Take 2 capsules (600 mg total) by mouth 4 (four) times daily.   glucose blood test strip Commonly known as:  ACCU-CHEK AVIVA PLUS Use as instructed for 4 times daily blood glucose monitoring   hydrochlorothiazide 25 MG tablet Commonly known as:  HYDRODIURIL Take 1 tablet (25 mg total) by mouth daily. Take on tablet in the morning.   Insulin Glargine 100 UNIT/ML Solostar Pen Commonly known as:  LANTUS SOLOSTAR Inject 60 units at bedtime.   insulin regular human CONCENTRATED 500 UNIT/ML injection Commonly known as:  HUMULIN R Inject 10 Units into the skin 3 (three) times daily with meals.   losartan 25 MG tablet Commonly known as:  COZAAR Take 1 tablet (25 mg total) by mouth daily.   Magnesium 250 MG Tabs Take 1 tablet by mouth 2 (two) times daily.   Melatonin 10 MG Tabs Take 3 tablets by mouth at bedtime.   meloxicam 15 MG tablet Commonly known as:  MOBIC TAKE ONE TABLET BY MOUTH ONCE DAILY .MUST  HAVE  OFFICE  VISIT  FOR  REFILLS   methocarbamol 750 MG tablet Commonly known as:   ROBAXIN-750 Take 1 tablet (750 mg total) by mouth 3 (three) times daily.   metoprolol tartrate 50 MG tablet Commonly known as:  LOPRESSOR Take 1 tablet (50 mg total) by mouth 2 (two) times daily.   MULTIVITAMIN PO Take 1 tablet by mouth daily.   nitroGLYCERIN  0.4 MG SL tablet Commonly known as:  NITROSTAT Place 1 tablet (0.4 mg total) under the tongue every 5 (five) minutes as needed for chest pain.   sodium chloride 0.65 % Soln nasal spray Commonly known as:  OCEAN Place 1 spray into both nostrils as needed for congestion.   traMADol 50 MG tablet Commonly known as:  ULTRAM Take 1 tablet (50 mg total) by mouth 2 (two) times daily.       Allergies:  Allergies  Allergen Reactions  . Hydrazine Yellow [Tartrazine] Shortness Of Breath and Swelling    Swelling mostly noticed in legs and feet, retaining urination, shortness of breaht, and minor chest pain  . Lisinopril Shortness Of Breath    Was on prinzide; had sob/chest pain on it.  . Tylenol [Acetaminophen] Itching and Swelling    Itching of the mouth, swelling of tongue and stomach started hurting    Past Medical History:  Diagnosis Date  . ARF (acute renal failure) ([facility]) 04/2015  . Asthma   . Cellulitis of right upper extremity   . Coronary artery disease   . Diabetes mellitus    insulin dependent  . Hyperlipidemia LDL goal <70   . Hypertension   . NSTEMI (non-ST elevated myocardial infarction) ([facility]) 08/2016  . Obesity   . S/P angioplasty with stent 08/2016   DES to mLAD and PTCA only to 2nd diag ostium.     Past Surgical History:  Procedure Laterality Date  . CESAREAN SECTION    . IRRIGATION AND DEBRIDEMENT SHOULDER Right 05/01/2015  . LEG SURGERY    . TONSILLECTOMY      Family History  Problem Relation Age of Onset  . Diabetes Mother   . Hypertension Mother   . Heart disease Mother   . Diabetes Father   . Heart disease Father   . Stroke Maternal Grandmother   . Cancer Maternal Grandmother      Social History:  reports that  has never smoked. she has never used smokeless tobacco. She reports that she drinks alcohol. She reports that she does not use drugs.  Review of Systems:  Hypertension:  She is on  Lopressor And amlodipine, followed by cardiologist She also thinks that she is having more swelling her legs  BP Readings from Last 3 Encounters:  10/14/17 (!) 144/88  10/11/17 (!) 163/98  09/20/17 139/84     Lipids: Has been treated with Lipitor 80 mg because of history of CAD  Lab Results  Component Value Date   CHOL 125 10/14/2017   HDL 38 (L) 10/14/2017   LDLCALC 56 10/14/2017   TRIG 155 (H) 10/14/2017   CHOLHDL 3.3 10/14/2017      NEUROPATHY: She has tingling in her legs and some in her hands also She said that she is being treated by PCP with maximum dose of gabapentin but having more symptoms recently  LABS:  No visits with results within 1 Week(s) from this visit.  Latest known visit with results is:  Office Visit on 10/14/2017  Component Date Value Ref Range Status  . Cholesterol, Total 10/14/2017 125  100 - 199 mg/dL Final  . Triglycerides 10/14/2017 155* 0 - 149 mg/dL Final  . HDL 40/98/1191 38* >39 mg/dL Final  . VLDL Cholesterol Cal 10/14/2017 31  5 - 40 mg/dL Final  . LDL Calculated 10/14/2017 56  0 - 99 mg/dL Final  . Chol/HDL Ratio 10/14/2017 3.3  0.0 - 4.4 ratio Final   Comment:  T. Chol/HDL Ratio                                             Men  Women                               1/2 Avg.Risk  3.4    3.3                                   Avg.Risk  5.0    4.4                                2X Avg.Risk  9.6    7.1                                3X Avg.Risk 23.4   11.0   . Total Protein 10/14/2017 7.4  6.0 - 8.5 g/dL Final  . Albumin 09/81/1914 4.3  3.5 - 5.5 g/dL Final  . Bilirubin Total 10/14/2017 0.3  0.0 - 1.2 mg/dL Final  . Bilirubin, Direct 10/14/2017 0.12  0.00 - 0.40 mg/dL Final  . Alkaline  Phosphatase 10/14/2017 131* 39 - 117 IU/L Final  . AST 10/14/2017 23  0 - 40 IU/L Final  . ALT 10/14/2017 31  0 - 32 IU/L Final     Examination:   There were no vitals taken for this visit.  There is no height or weight on file to calculate BMI.   1+ ankle/leg edema present  ASSESSMENT/ PLAN:    Diabetes type 2:   See history of present illness for detailed discussion of current diabetes management, blood sugar patterns and problems identified  Blood glucose control has been better with adding Byetta to her regimen of Jardiance, Lantus and Humulin R U-500 but still not optimal A1c is still high at 8.2 even though her fructosamine was better last time  Again difficult to assess her blood sugar patterns since she did not bring her meter and typically does not monitor glucose adequately at various times as directed She is gaining a large amount of weight and not clear why Most likely can do better with her diet and she does know which foods are making her blood sugars go up  RECOMMENDATIONS:  Follow diet recommended by dietitian and scheduled follow-up.  She will start taking 10 g of Byetta in the morning and 5 g before lunch  Stop taking Humulin R at bedtime  To start Byetta 5 g before lunch and dinner and not in the morning  Bring monitor for review on each visit  If she does tend to have low sugars with increasing Byetta she can reduce her Humulin regular insulin  Regular exercise.  NEUROPATHY: She says she is having more tingling despite taking maximum dose gabapentin Although she thinks Lyrica works better she probably will have weight gain and edema with this She will be given a trial of Cymbalta 30 mg daily at suppertime and if this is effective in a couple of weeks she can start gradually cutting back on gabapentin  Edema and hypertension: She will discuss with her PCP this  week   There are no Patient Instructions on file for this visit. Counseling time on  subjects discussed above is over 50% of today's 25 minute visit   Jorey Dollard 11/02/2017, 8:08 AM

## 2017-11-08 ENCOUNTER — Encounter: Payer: Medicaid Other | Attending: Physical Medicine & Rehabilitation

## 2017-11-08 ENCOUNTER — Encounter: Payer: Self-pay | Admitting: Physical Medicine & Rehabilitation

## 2017-11-08 ENCOUNTER — Ambulatory Visit: Payer: Medicaid Other | Admitting: Physical Medicine & Rehabilitation

## 2017-11-08 ENCOUNTER — Other Ambulatory Visit: Payer: Self-pay

## 2017-11-08 VITALS — BP 148/95 | HR 91

## 2017-11-08 DIAGNOSIS — I2510 Atherosclerotic heart disease of native coronary artery without angina pectoris: Secondary | ICD-10-CM | POA: Insufficient documentation

## 2017-11-08 DIAGNOSIS — I214 Non-ST elevation (NSTEMI) myocardial infarction: Secondary | ICD-10-CM | POA: Diagnosis not present

## 2017-11-08 DIAGNOSIS — M25511 Pain in right shoulder: Secondary | ICD-10-CM | POA: Insufficient documentation

## 2017-11-08 DIAGNOSIS — Z955 Presence of coronary angioplasty implant and graft: Secondary | ICD-10-CM | POA: Insufficient documentation

## 2017-11-08 DIAGNOSIS — Z8249 Family history of ischemic heart disease and other diseases of the circulatory system: Secondary | ICD-10-CM | POA: Diagnosis not present

## 2017-11-08 DIAGNOSIS — E119 Type 2 diabetes mellitus without complications: Secondary | ICD-10-CM | POA: Insufficient documentation

## 2017-11-08 DIAGNOSIS — Z833 Family history of diabetes mellitus: Secondary | ICD-10-CM | POA: Diagnosis not present

## 2017-11-08 DIAGNOSIS — I10 Essential (primary) hypertension: Secondary | ICD-10-CM | POA: Insufficient documentation

## 2017-11-08 DIAGNOSIS — E785 Hyperlipidemia, unspecified: Secondary | ICD-10-CM | POA: Insufficient documentation

## 2017-11-08 DIAGNOSIS — M47816 Spondylosis without myelopathy or radiculopathy, lumbar region: Secondary | ICD-10-CM

## 2017-11-08 DIAGNOSIS — M25562 Pain in left knee: Secondary | ICD-10-CM | POA: Insufficient documentation

## 2017-11-08 DIAGNOSIS — M25561 Pain in right knee: Secondary | ICD-10-CM | POA: Insufficient documentation

## 2017-11-08 DIAGNOSIS — J45909 Unspecified asthma, uncomplicated: Secondary | ICD-10-CM | POA: Diagnosis not present

## 2017-11-08 DIAGNOSIS — G8929 Other chronic pain: Secondary | ICD-10-CM | POA: Insufficient documentation

## 2017-11-08 DIAGNOSIS — M545 Low back pain: Secondary | ICD-10-CM | POA: Insufficient documentation

## 2017-11-08 DIAGNOSIS — E669 Obesity, unspecified: Secondary | ICD-10-CM | POA: Insufficient documentation

## 2017-11-08 DIAGNOSIS — Z823 Family history of stroke: Secondary | ICD-10-CM | POA: Insufficient documentation

## 2017-11-08 DIAGNOSIS — Z9889 Other specified postprocedural states: Secondary | ICD-10-CM | POA: Insufficient documentation

## 2017-11-08 DIAGNOSIS — Z809 Family history of malignant neoplasm, unspecified: Secondary | ICD-10-CM | POA: Diagnosis not present

## 2017-11-08 MED ORDER — TRAMADOL HCL 50 MG PO TABS: 50.0000 mg | ORAL_TABLET | Freq: Two times a day (BID) | ORAL | 1 refills | Status: DC

## 2017-11-08 NOTE — Progress Notes (Signed)
Subjective:    Patient ID: Lindsay [name], female    DOB: 15-Sep-1981, 36 y.o.   MRN: [id number]  HPI  Patient returns after having bilateral L3-L4 medial branch and L5 dorsal ramus injections under fluoroscopic guidance performed 10/11/2017.  She had excellent relief, greater than 50% pain reduction for approximately 3 weeks.  Her activity tolerance improved. She is asking about her knee x-rays as well.  She complains of bilateral medial knee pain. Her weight loss efforts are continuing, she is continue to lose weight, last record 368 Lb Pain Inventory Average Pain 9 Pain Right Now 7 My pain is constant, sharp and dull  In the last 24 hours, has pain interfered with the following? General activity 6 Relation with others 6 Enjoyment of life 8 What TIME of day is your pain at its worst? morning and evening Sleep (in general) Fair  Pain is worse with: walking, bending, standing and some activites Pain improves with: rest, heat/ice and medication Relief from Meds: 7  Mobility walk without assistance how many minutes can you walk? 10-15 ability to climb steps?  yes do you drive?  yes  Function not employed: date last employed .  Neuro/Psych spasms anxiety  Prior Studies Any changes since last visit?  no  Physicians involved in your care Any changes since last visit?  no   Family History  Problem Relation Age of Onset  . Diabetes Mother   . Hypertension Mother   . Heart disease Mother   . Diabetes Father   . Heart disease Father   . Stroke Maternal Grandmother   . Cancer Maternal Grandmother    Social History   Socioeconomic History  . Marital status: Single    Spouse name: Not on file  . Number of children: Not on file  . Years of education: Not on file  . Highest education level: Not on file  Social Needs  . Financial resource strain: Not on file  . Food insecurity - worry: Not on file  . Food insecurity - inability: Not on file  . Transportation  needs - medical: Not on file  . Transportation needs - non-medical: Not on file  Occupational History  . Not on file  Tobacco Use  . Smoking status: Never Smoker  . Smokeless tobacco: Never Used  Substance and Sexual Activity  . Alcohol use: Yes    Comment: socially  . Drug use: No  . Sexual activity: Not on file  Other Topics Concern  . Not on file  Social History Narrative  . Not on file   Past Surgical History:  Procedure Laterality Date  . ARTHROSCOPY SHOULDER Right 04/30/2015   Performed by Sheral Apley, MD at [facility] OR  . CESAREAN SECTION    . Coronary Balloon Angioplasty N/A 09/07/2016   Performed by Lyn Records, MD at [facility] INVASIVE CV LAB  . Coronary Stent Intervention N/A 09/07/2016   Performed by Lyn Records, MD at [facility] INVASIVE CV LAB  . IRRIGATION AND DEBRIDEMENT SHOULDER Right 05/01/2015  . IRRIGATION AND DEBRIDEMENT SHOULDER Right 04/30/2015   Performed by Sheral Apley, MD at [facility] OR  . Left Heart Cath and Coronary Angiography N/A 09/07/2016   Performed by Lyn Records, MD at [facility] INVASIVE CV LAB  . LEG SURGERY    . TONSILLECTOMY     Past Medical History:  Diagnosis Date  . ARF (acute renal failure) (HCC) 04/2015  . Asthma   . Cellulitis of right upper  extremity   . Coronary artery disease   . Diabetes mellitus    insulin dependent  . Hyperlipidemia LDL goal <70   . Hypertension   . NSTEMI (non-ST elevated myocardial infarction) (HCC) 08/2016  . Obesity   . S/P angioplasty with stent 08/2016   DES to mLAD and PTCA only to 2nd diag ostium.    There were no vitals taken for this visit.  Opioid Risk Score:   Fall Risk Score:  `1  Depression screen PHQ 2/9  [facility] 11/08/2017 05/28/2017 04/27/2017 04/13/2017 02/24/2017 02/19/2017 02/11/2017  Decreased Interest 0 0 0 0 0 0 0  Down, Depressed, Hopeless 0 0 0 0 0 0 0  PHQ - 2 Score 0 0 0 0 0 0 0      Review of Systems     Objective:   Physical Exam  Constitutional: She is oriented to  person, place, and time. She appears well-developed and well-nourished. No distress.  HENT:  Head: Normocephalic and atraumatic.  Eyes: Conjunctivae and EOM are normal. Pupils are equal, round, and reactive to light.  Neck: Normal range of motion.  Musculoskeletal:       Lumbar back: She exhibits decreased range of motion and tenderness.  Patient has normal lumbar extension, lumbar flexion is limited to 50%, lumbar lateral bending is limited to 50% Tenderness palpation bilateral L3-L4-L5 lumbar paraspinal  Neurological: She is alert and oriented to person, place, and time.  Skin: Skin is warm and dry. She is not diaphoretic.  Psychiatric: She has a normal mood and affect.  Nursing note and vitals reviewed.  Motor strength is 5/5 bilateral hip flexor knee extensor ankle dorsiflexor.        Assessment & Plan:  1.  Lumbar spondylosis without evidence of myelopathy.  Patient had a good response to lumbar medial branch blocks.  We will repeat bilateral L3-L4 medial branch and L5 dorsal ramus injections under fluoroscopic guidance Continue tramadol 50 mg twice daily. If patient has positive response but short-term to medialbranch blocks, she would be a good candidate for radiofrequency neurotomy

## 2017-11-19 ENCOUNTER — Other Ambulatory Visit: Payer: Self-pay | Admitting: Endocrinology

## 2017-11-19 ENCOUNTER — Other Ambulatory Visit: Payer: Self-pay | Admitting: Physical Medicine & Rehabilitation

## 2017-11-19 NOTE — Telephone Encounter (Signed)
Recieved electronic medication refill request for methocarbamol, according to note written on 09-20-17:  Robaxin 750 3 times a day, and do not plan to keep you on this long-term, but only until we can do the injections  Patient recieved injections on 10-11-17 with only medication mentioned was"  May resume brillinta tonight.  Last note on 11-08-17 has no mention of the medication at all, is it ok to refill this medication or is it discontinued, please advise

## 2017-11-19 NOTE — Telephone Encounter (Signed)
Will hold off on prescription, will use methocarbamol only for flareups.

## 2017-11-23 ENCOUNTER — Other Ambulatory Visit: Payer: Self-pay | Admitting: Physical Medicine & Rehabilitation

## 2017-11-30 ENCOUNTER — Ambulatory Visit: Payer: Medicaid Other | Admitting: Physical Medicine & Rehabilitation

## 2017-12-06 ENCOUNTER — Other Ambulatory Visit (INDEPENDENT_AMBULATORY_CARE_PROVIDER_SITE_OTHER): Payer: Self-pay | Admitting: Physician Assistant

## 2017-12-06 DIAGNOSIS — J45909 Unspecified asthma, uncomplicated: Secondary | ICD-10-CM

## 2017-12-06 NOTE — Telephone Encounter (Signed)
FWD to PCP. Tempestt S Roberts, CMA  

## 2017-12-30 ENCOUNTER — Encounter: Payer: Self-pay | Admitting: Physical Medicine & Rehabilitation

## 2017-12-30 ENCOUNTER — Ambulatory Visit: Payer: Medicaid Other | Admitting: Physical Medicine & Rehabilitation

## 2017-12-30 ENCOUNTER — Encounter: Payer: Medicaid Other | Attending: Physical Medicine & Rehabilitation

## 2017-12-30 VITALS — BP 151/95 | HR 95 | Resp 14

## 2017-12-30 DIAGNOSIS — M47816 Spondylosis without myelopathy or radiculopathy, lumbar region: Secondary | ICD-10-CM | POA: Diagnosis not present

## 2017-12-30 DIAGNOSIS — E785 Hyperlipidemia, unspecified: Secondary | ICD-10-CM | POA: Diagnosis not present

## 2017-12-30 DIAGNOSIS — Z833 Family history of diabetes mellitus: Secondary | ICD-10-CM | POA: Diagnosis not present

## 2017-12-30 DIAGNOSIS — Z809 Family history of malignant neoplasm, unspecified: Secondary | ICD-10-CM | POA: Diagnosis not present

## 2017-12-30 DIAGNOSIS — M25511 Pain in right shoulder: Secondary | ICD-10-CM | POA: Insufficient documentation

## 2017-12-30 DIAGNOSIS — Z8249 Family history of ischemic heart disease and other diseases of the circulatory system: Secondary | ICD-10-CM | POA: Diagnosis not present

## 2017-12-30 DIAGNOSIS — M25561 Pain in right knee: Secondary | ICD-10-CM | POA: Diagnosis not present

## 2017-12-30 DIAGNOSIS — Z955 Presence of coronary angioplasty implant and graft: Secondary | ICD-10-CM | POA: Insufficient documentation

## 2017-12-30 DIAGNOSIS — I2510 Atherosclerotic heart disease of native coronary artery without angina pectoris: Secondary | ICD-10-CM | POA: Insufficient documentation

## 2017-12-30 DIAGNOSIS — E669 Obesity, unspecified: Secondary | ICD-10-CM | POA: Diagnosis not present

## 2017-12-30 DIAGNOSIS — J45909 Unspecified asthma, uncomplicated: Secondary | ICD-10-CM | POA: Diagnosis not present

## 2017-12-30 DIAGNOSIS — E119 Type 2 diabetes mellitus without complications: Secondary | ICD-10-CM | POA: Insufficient documentation

## 2017-12-30 DIAGNOSIS — I10 Essential (primary) hypertension: Secondary | ICD-10-CM | POA: Insufficient documentation

## 2017-12-30 DIAGNOSIS — Z9889 Other specified postprocedural states: Secondary | ICD-10-CM | POA: Insufficient documentation

## 2017-12-30 DIAGNOSIS — I214 Non-ST elevation (NSTEMI) myocardial infarction: Secondary | ICD-10-CM | POA: Diagnosis not present

## 2017-12-30 DIAGNOSIS — M545 Low back pain: Secondary | ICD-10-CM | POA: Diagnosis not present

## 2017-12-30 DIAGNOSIS — Z823 Family history of stroke: Secondary | ICD-10-CM | POA: Insufficient documentation

## 2017-12-30 DIAGNOSIS — G8929 Other chronic pain: Secondary | ICD-10-CM | POA: Insufficient documentation

## 2017-12-30 DIAGNOSIS — M25562 Pain in left knee: Secondary | ICD-10-CM | POA: Insufficient documentation

## 2017-12-30 MED ORDER — TRAMADOL HCL 50 MG PO TABS: 50.0000 mg | ORAL_TABLET | Freq: Two times a day (BID) | ORAL | 1 refills | Status: DC

## 2017-12-30 NOTE — Patient Instructions (Signed)

## 2017-12-30 NOTE — Progress Notes (Signed)
PROCEDURE RECORD [facility]   Name: Lindsay [name] DOB:[birth year] MRN: [id number]  Date:12/30/2017  Physician: Claudette Laws, MD    Nurse/CMA: Laurella Tull, CMA  Allergies:  Allergies  Allergen Reactions  . Hydrazine Yellow [Tartrazine] Shortness Of Breath and Swelling    Swelling mostly noticed in legs and feet, retaining urination, shortness of breaht, and minor chest pain  . Lisinopril Shortness Of Breath    Was on prinzide; had sob/chest pain on it.  . Tylenol [Acetaminophen] Itching and Swelling    Itching of the mouth, swelling of tongue and stomach started hurting    Consent Signed: Yes.    Is patient diabetic? Yes.    CBG today? 201  Pregnant: No. LMP: No LMP recorded. Patient is not currently having periods (Reason: Irregular Periods). (age 32-55)  Anticoagulants: no Anti-inflammatory: no Antibiotics: no  Procedure: bilateral L3,4,5 medial branch block  Position: Prone Start Time: 10:19am  End Time: 10:40am  Fluoro Time: 1:57s  RN/CMA Tiffany Calmes, CMA Sreekar Broyhill, CMA    Time 9:52am 10:47am    BP 151/95 173/103    Pulse 95 100    Respirations 14 14    O2 Sat 94 94    S/S 6 6    Pain Level 10/10 9/10     D/C home with friend, patient A & O X 3, D/C instructions reviewed, and sits independently.

## 2017-12-30 NOTE — Progress Notes (Signed)
Bilateral Lumbar L3, L4  medial branch blocks and L 5 dorsal ramus injection under fluoroscopic guidance  Indication: Lumbar pain which is not relieved by medication management or other conservative care and interfering with self-care and mobility.  Informed consent was obtained after describing risks and benefits of the procedure with the patient, this includes bleeding, infection, paralysis and medication side effects.  The patient wishes to proceed and has given written consent.  The patient was placed in prone position.  The lumbar area was marked and prepped with Betadine.  One mL of 1% lidocaine was injected into each of 6 areas into the skin and subcutaneous tissue.  Then a 22-gauge 7in spinal needle was inserted targeting the junction of the left S1 superior articular process and sacral ala junction. Needle was advanced under fluoroscopic guidance.  Bone contact was made.  Isovue 200 was injected x 0.5 mL demonstrating no intravascular uptake.  Then a solution  of 2% MPF lidocaine was injected x 0.5 mL.  Then the left L5 superior articular process in transverse process junction was targeted.  Bone contact was made.  Isovue 200 was injected x 0.5 mL demonstrating no intravascular uptake. Then a solution containing  2% MPF lidocaine was injected x 0.5 mL.  Then the left L4 superior articular process in transverse process junction was targeted.  Bone contact was made.  Isovue 200 was injected x 0.5 mL demonstrating no intravascular uptake.  Then a solution containing2% MPF lidocaine was injected x 0.5 mL.  This same procedure was performed on the right side using the same needle, technique and injectate.  Patient tolerated procedure well.  Post procedure instructions were given. 

## 2018-01-01 ENCOUNTER — Encounter (HOSPITAL_COMMUNITY): Payer: Self-pay | Admitting: Emergency Medicine

## 2018-01-01 ENCOUNTER — Emergency Department (HOSPITAL_COMMUNITY)
Admission: EM | Admit: 2018-01-01 | Discharge: 2018-01-01 | Disposition: A | Discharge status: Home / Self Care | Payer: Medicaid Other | Attending: Emergency Medicine | Admitting: Emergency Medicine

## 2018-01-01 ENCOUNTER — Other Ambulatory Visit: Payer: Self-pay

## 2018-01-01 DIAGNOSIS — I10 Essential (primary) hypertension: Secondary | ICD-10-CM | POA: Diagnosis not present

## 2018-01-01 DIAGNOSIS — E1140 Type 2 diabetes mellitus with diabetic neuropathy, unspecified: Secondary | ICD-10-CM | POA: Diagnosis not present

## 2018-01-01 DIAGNOSIS — J45909 Unspecified asthma, uncomplicated: Secondary | ICD-10-CM | POA: Diagnosis not present

## 2018-01-01 DIAGNOSIS — L02416 Cutaneous abscess of left lower limb: Secondary | ICD-10-CM | POA: Diagnosis not present

## 2018-01-01 DIAGNOSIS — L0291 Cutaneous abscess, unspecified: Secondary | ICD-10-CM

## 2018-01-01 DIAGNOSIS — I252 Old myocardial infarction: Secondary | ICD-10-CM | POA: Diagnosis not present

## 2018-01-01 DIAGNOSIS — Z79899 Other long term (current) drug therapy: Secondary | ICD-10-CM | POA: Diagnosis not present

## 2018-01-01 DIAGNOSIS — Z794 Long term (current) use of insulin: Secondary | ICD-10-CM | POA: Diagnosis not present

## 2018-01-01 DIAGNOSIS — I2510 Atherosclerotic heart disease of native coronary artery without angina pectoris: Secondary | ICD-10-CM | POA: Insufficient documentation

## 2018-01-01 DIAGNOSIS — Z7982 Long term (current) use of aspirin: Secondary | ICD-10-CM | POA: Insufficient documentation

## 2018-01-01 LAB — CBC WITH DIFFERENTIAL/PLATELET
Basophils Absolute: 0 10*3/uL (ref 0.0–0.1)
Basophils Relative: 0 %
Eosinophils Absolute: 0.1 10*3/uL (ref 0.0–0.7)
Eosinophils Relative: 1 %
HEMATOCRIT: 37.1 % (ref 36.0–46.0)
Hemoglobin: 12.2 g/dL (ref 12.0–15.0)
LYMPHS PCT: 13 %
Lymphs Abs: 1.6 10*3/uL (ref 0.7–4.0)
MCH: 27.1 pg (ref 26.0–34.0)
MCHC: 32.9 g/dL (ref 30.0–36.0)
MCV: 82.4 fL (ref 78.0–100.0)
Monocytes Absolute: 0.9 10*3/uL (ref 0.1–1.0)
Monocytes Relative: 8 %
NEUTROS ABS: 9.4 10*3/uL — AB (ref 1.7–7.7)
NEUTROS PCT: 78 %
Platelets: 287 10*3/uL (ref 150–400)
RBC: 4.5 MIL/uL (ref 3.87–5.11)
RDW: 14.5 % (ref 11.5–15.5)
WBC: 11.9 10*3/uL — AB (ref 4.0–10.5)

## 2018-01-01 LAB — COMPREHENSIVE METABOLIC PANEL
ALBUMIN: 2.9 g/dL — AB (ref 3.5–5.0)
ALT: 28 U/L (ref 14–54)
AST: 30 U/L (ref 15–41)
Alkaline Phosphatase: 168 U/L — ABNORMAL HIGH (ref 38–126)
Anion gap: 13 (ref 5–15)
BILIRUBIN TOTAL: 1.1 mg/dL (ref 0.3–1.2)
BUN: 12 mg/dL (ref 6–20)
CHLORIDE: 97 mmol/L — AB (ref 101–111)
CO2: 20 mmol/L — ABNORMAL LOW (ref 22–32)
CREATININE: 0.87 mg/dL (ref 0.44–1.00)
Calcium: 8.7 mg/dL — ABNORMAL LOW (ref 8.9–10.3)
GFR calc Af Amer: >60 mL/min (ref >60)
GLUCOSE: 520 mg/dL — AB (ref 65–99)
Potassium: 5.2 mmol/L — ABNORMAL HIGH (ref 3.5–5.1)
Sodium: 130 mmol/L — ABNORMAL LOW (ref 135–145)
TOTAL PROTEIN: 7.5 g/dL (ref 6.5–8.1)

## 2018-01-01 MED ORDER — INSULIN GLARGINE 100 UNIT/ML ~~LOC~~ SOLN
32.5000 [IU] | Freq: Once | SUBCUTANEOUS | Status: AC
  Administered 2018-01-01: 33 [IU] via SUBCUTANEOUS
  Filled 2018-01-01: qty 0.33

## 2018-01-01 MED ORDER — OXYCODONE HCL 5 MG PO TABS: 5.0000 mg | ORAL_TABLET | ORAL | 0 refills | Status: DC | PRN

## 2018-01-01 MED ORDER — OXYCODONE HCL 5 MG PO TABS
5.0000 mg | ORAL_TABLET | Freq: Once | ORAL | Status: AC
  Administered 2018-01-01: 5 mg via ORAL
  Filled 2018-01-01: qty 1

## 2018-01-01 MED ORDER — LIDOCAINE HCL 2 % IJ SOLN
20.0000 mL | Freq: Once | INTRAMUSCULAR | Status: AC
  Administered 2018-01-01: 400 mg
  Filled 2018-01-01: qty 20

## 2018-01-01 MED ORDER — SULFAMETHOXAZOLE-TRIMETHOPRIM 800-160 MG PO TABS: 2.0000 | ORAL_TABLET | Freq: Two times a day (BID) | ORAL | 0 refills | Status: AC

## 2018-01-01 NOTE — ED Notes (Signed)
Patient given discharge instructions and verbalized understanding.  Patient stable to discharge at this time.  Patient is alert and oriented to baseline.  No distressed noted at this time.  All belongings taken with the patient at discharge.   

## 2018-01-01 NOTE — ED Provider Notes (Signed)
[facility] EMERGENCY DEPARTMENT Provider Note   CSN: [id number] Arrival date & time: 01/01/18  1300     History   Chief Complaint Chief Complaint  Patient presents with  . Abscess    HPI Lindsay [name] is a 37 y.o. female.  HPI Patient presents to the emergency department with an abscess to the left medial upper thigh.  Patient states she noticed the area 5 days ago.  She states that the area got worse last night.  She states she noticed it started draining this morning.  The patient denies chest pain, shortness of breath, headache,blurred vision, neck pain, fever, cough, weakness, numbness, dizziness, anorexia, edema, abdominal pain, nausea, vomiting, diarrhea, rash, back pain, dysuria, hematemesis, bloody stool, near syncope, or syncope. Past Medical History:  Diagnosis Date  . ARF (acute renal failure) (HCC) 04/2015  . Asthma   . Cellulitis of right upper extremity   . Coronary artery disease   . Diabetes mellitus    insulin dependent  . Hyperlipidemia LDL goal <70   . Hypertension   . NSTEMI (non-ST elevated myocardial infarction) (HCC) 08/2016  . Obesity   . S/P angioplasty with stent 08/2016   DES to mLAD and PTCA only to 2nd diag ostium.     Patient Active Problem List   Diagnosis Date Noted  . OSA (obstructive sleep apnea) 11/03/2016  . Coronary artery disease involving native coronary artery of native heart with angina pectoris (HCC) 09/11/2016  . Diabetes mellitus with complication (HCC)   . Community acquired pneumonia 09/07/2016  . MI, old   . DM neuropathy, type II diabetes mellitus (HCC) 06/10/2016  . Morbid obesity (HCC) 06/10/2016  . Pressure ulcer 05/17/2015  . Anaerobic abscess (HCC)   . ARF (acute renal failure) (HCC)   . Septic arthritis (HCC)   . Essential hypertension 04/30/2015  . Cellulitis 04/30/2015  . Abscess of right shoulder     Past Surgical History:  Procedure Laterality Date  . CARDIAC CATHETERIZATION N/A  09/07/2016   Procedure: Left Heart Cath and Coronary Angiography;  Surgeon: Lyn Records, MD;  Location: [facility] INVASIVE CV LAB;  Service: Cardiovascular;  Laterality: N/A;  . CARDIAC CATHETERIZATION N/A 09/07/2016   Procedure: Coronary Stent Intervention;  Surgeon: Lyn Records, MD;  Location: [facility] INVASIVE CV LAB;  Service: Cardiovascular;  Laterality: N/A;  . CARDIAC CATHETERIZATION N/A 09/07/2016   Procedure: Coronary Balloon Angioplasty;  Surgeon: Lyn Records, MD;  Location: [facility] INVASIVE CV LAB;  Service: Cardiovascular;  Laterality: N/A;  . CESAREAN SECTION    . IRRIGATION AND DEBRIDEMENT SHOULDER Right 04/30/2015   Procedure: IRRIGATION AND DEBRIDEMENT SHOULDER;  Surgeon: Sheral Apley, MD;  Location: [facility] OR;  Service: Orthopedics;  Laterality: Right;  . IRRIGATION AND DEBRIDEMENT SHOULDER Right 05/01/2015  . LEG SURGERY    . SHOULDER ARTHROSCOPY Right 04/30/2015   Procedure: ARTHROSCOPY SHOULDER;  Surgeon: Sheral Apley, MD;  Location: [facility] OR;  Service: Orthopedics;  Laterality: Right;  . TONSILLECTOMY      OB History    No data available       Home Medications    Prior to Admission medications   Medication Sig Start Date End Date Taking? Authorizing Provider  amLODipine (NORVASC) 5 MG tablet Take 1 tablet (5 mg total) by mouth daily. 04/27/17  Yes Loletta Specter, PA-C  aspirin 81 MG tablet Take 81 mg by mouth daily.   Yes [provider]  aspirin-acetaminophen-caffeine (EXCEDRIN MIGRAINE) [phone] MG tablet Take  2 tablets by mouth every 6 (six) hours as needed for headache.   Yes [provider]  atorvastatin (LIPITOR) 80 MG tablet Take 1 tablet (80 mg total) by mouth daily at 6 PM. Please call and schedule November/December follow up. [phone] 08/03/17  Yes Lyn Records, MD  Camphor-Eucalyptus-Menthol (VICKS VAPORUB EX) Apply 1 application topically as needed (Congestion).   Yes [provider]  diclofenac sodium (VOLTAREN) 1 % GEL Apply 2 g  topically 4 (four) times daily. Patient taking differently: Apply 2 g topically 4 (four) times daily as needed (Pain).  10/14/17  Yes Kirsteins, Victorino Sparrow, MD  DULoxetine (CYMBALTA) 30 MG capsule TAKE 1 CAPSULE BY MOUTH ONCE DAILY 11/19/17  Yes Reather Littler, MD  empagliflozin (JARDIANCE) 10 MG TABS tablet Take 10 mg by mouth daily with breakfast. 08/09/17  Yes Reather Littler, MD  gabapentin (NEURONTIN) 300 MG capsule Take 2 capsules (600 mg total) by mouth 4 (four) times daily. 05/28/17  Yes Loletta Specter, PA-C  GARLIC PO Take 1 tablet by mouth 2 (two) times daily.   Yes [provider]  ibuprofen (ADVIL,MOTRIN) 200 MG tablet Take 400 mg by mouth every 6 (six) hours as needed for fever or moderate pain.   Yes [provider]  Insulin Glargine (LANTUS SOLOSTAR) 100 UNIT/ML Solostar Pen Inject 60 units at bedtime. Patient taking differently: Inject 32.5 Units into the skin 2 (two) times daily.  02/02/17  Yes Reather Littler, MD  insulin regular human CONCENTRATED (HUMULIN R) 500 UNIT/ML injection Inject 10-25 Units into the skin 3 (three) times daily with meals.    Yes [provider]  meloxicam (MOBIC) 15 MG tablet TAKE ONE TABLET BY MOUTH ONCE DAILY .MUST  HAVE  OFFICE  VISIT  FOR  REFILLS 05/28/17  Yes Loletta Specter, PA-C  methocarbamol (ROBAXIN) 750 MG tablet TAKE 1 TABLET BY MOUTH THREE TIMES DAILY 11/23/17  Yes Kirsteins, Victorino Sparrow, MD  metoprolol (LOPRESSOR) 50 MG tablet Take 1 tablet (50 mg total) by mouth 2 (two) times daily. 04/27/17  Yes Loletta Specter, PA-C  nitroGLYCERIN (NITROSTAT) 0.4 MG SL tablet Place 1 tablet (0.4 mg total) under the tongue every 5 (five) minutes as needed for chest pain. 10/14/17  Yes Lyn Records, MD  diphenhydrAMINE (BENADRYL) 25 MG tablet Take 1 tablet (25 mg total) by mouth every 6 (six) hours as needed. 04/13/17   Loletta Specter, PA-C  exenatide (BYETTA 10 MCG PEN) 10 MCG/0.04ML SOPN injection Inject 0.04 mLs (10 mcg total) into the  skin 2 (two) times daily with a meal. Take 30 minutes before breakfast and lunch Patient not taking: Reported on 01/01/2018 08/03/17   Reather Littler, MD  hydrochlorothiazide (HYDRODIURIL) 25 MG tablet Take 1 tablet (25 mg total) by mouth daily. Take on tablet in the morning. 04/27/17   Loletta Specter, PA-C  losartan (COZAAR) 25 MG tablet Take 1 tablet (25 mg total) by mouth daily. 05/28/17   Loletta Specter, PA-C  Magnesium 250 MG TABS Take 1 tablet by mouth 2 (two) times daily.    [provider]  Melatonin 10 MG TABS Take 3 tablets by mouth at bedtime.    [provider]  Multiple Vitamins-Minerals (MULTIVITAMIN PO) Take 1 tablet by mouth daily.    [provider]  PROVENTIL HFA 108 (90 Base) MCG/ACT inhaler INHALE 2 PUFFS BY MOUTH EVERY 6 HOURS AS NEEDED FOR WHEEZING OR SHORTNESS OF BREATH 12/06/17   Loletta Specter, PA-C  sodium chloride (OCEAN) 0.65 % SOLN nasal spray Place 1 spray into both nostrils as needed for congestion. 02/19/17   Loletta Specter, PA-C  traMADol (ULTRAM) 50 MG tablet Take 1 tablet (50 mg total) by mouth 2 (two) times daily. Patient taking differently: Take 50 mg by mouth 2 (two) times daily as needed for moderate pain.  12/30/17   Kirsteins, Victorino Sparrow, MD    Family History Family History  Problem Relation Age of Onset  . Diabetes Mother   . Hypertension Mother   . Heart disease Mother   . Diabetes Father   . Heart disease Father   . Stroke Maternal Grandmother   . Cancer Maternal Grandmother     Social History Social History   Tobacco Use  . Smoking status: Never Smoker  . Smokeless tobacco: Never Used  Substance Use Topics  . Alcohol use: Yes    Comment: socially  . Drug use: No     Allergies   Hydrazine yellow [tartrazine]; Lisinopril; and Tylenol [acetaminophen]   Review of Systems Review of Systems All other systems negative except as documented in the HPI. All pertinent positives and negatives as reviewed in the  HPI.  Physical Exam Updated Vital Signs BP (!) 146/80   Pulse (!) 101   Temp 99.7 F (37.6 C) (Oral)   Resp 18   SpO2 97%   Physical Exam  Constitutional: She is oriented to person, place, and time. She appears well-developed and well-nourished. No distress.  HENT:  Head: Normocephalic and atraumatic.  Eyes: Pupils are equal, round, and reactive to light.  Pulmonary/Chest: Effort normal.  Musculoskeletal:       Legs: Neurological: She is alert and oriented to person, place, and time.  Skin: Skin is warm and dry.  Psychiatric: She has a normal mood and affect.  Nursing note and vitals reviewed.    ED Treatments / Results  Labs (all labs ordered are listed, but only abnormal results are displayed) Labs Reviewed  CBC WITH DIFFERENTIAL/PLATELET - Abnormal; Notable for the following components:      Result Value   WBC 11.9 (*)    Neutro Abs 9.4 (*)    All other components within normal limits  COMPREHENSIVE METABOLIC PANEL - Abnormal; Notable for the following components:   Sodium 130 (*)    Potassium 5.2 (*)    Chloride 97 (*)    CO2 20 (*)    Glucose, Bld 520 (*)    Calcium 8.7 (*)    Albumin 2.9 (*)    Alkaline Phosphatase 168 (*)    All other components within normal limits    EKG  EKG Interpretation None       Radiology No results found.  Procedures Procedures (including critical care time)  Medications Ordered in ED Medications  insulin glargine (LANTUS) injection 33 Units (not administered)  oxyCODONE (Oxy IR/ROXICODONE) immediate release tablet 5 mg (5 mg Oral Given 01/01/18 1458)  lidocaine (XYLOCAINE) 2 % (with pres) injection 400 mg (400 mg Infiltration Given by Other 01/01/18 1458)     Initial Impression / Assessment and Plan / ED Course  I have reviewed the triage vital signs and the nursing notes.  Pertinent labs & imaging results that were available during my care of the patient were reviewed by me and considered in my medical decision  making (see chart for details).    INCISION AND DRAINAGE Performed by: Jamesetta Orleans Kerri Asche Consent: Verbal consent obtained. Risks and benefits: risks, benefits and alternatives  were discussed Type: abscess  Body area: Left upper medial thigh  Anesthesia: local infiltration  Incision was made with a scalpel.  Local anesthetic: lidocaine 2% wo epinephrine  Anesthetic total: 8 ml  Complexity: complex Blunt dissection to break up loculations  Drainage: purulent  Drainage amount: large  Packing material: 1/4 in iodoform gauze  Patient tolerance: Patient tolerated the procedure well with no immediate complications.    Patient be advised to return here in 3 days for packing removal and recheck.  I have advised the patient to warm compresses around the area.  Patient agrees with plan and all questions were answered  Final Clinical Impressions(s) / ED Diagnoses   Final diagnoses:  None    ED Discharge Orders    None       Charlestine Night, PA-C 01/01/18 1606    Doug Sou, MD 01/01/18 1635

## 2018-01-01 NOTE — Discharge Instructions (Signed)
Have the area rechecked in 3 days.  To keep the area covered as it will continue to drain.  Use heat around the area to help promote further drainage.

## 2018-01-01 NOTE — ED Triage Notes (Signed)
Pt reports an abscess to LT upper thigh. Pt having difficulty ambulating due to pain.

## 2018-01-01 NOTE — ED Notes (Signed)
Notified pharmacy for Lantus

## 2018-01-01 NOTE — ED Notes (Signed)
Pharmacy at bedside updating pt medications

## 2018-01-01 NOTE — ED Triage Notes (Signed)
Pt presents to ED for assessment of right groin abscess developing Tuesday, worsening, and patient states "really big" in her groin.  Pt c/o worsening pain, increasing temp.

## 2018-01-01 NOTE — ED Notes (Signed)
Pt lunch tray at bedside remains untouched. Pt states no appetite.

## 2018-01-13 ENCOUNTER — Encounter (INDEPENDENT_AMBULATORY_CARE_PROVIDER_SITE_OTHER): Payer: Self-pay | Admitting: Physician Assistant

## 2018-01-13 ENCOUNTER — Other Ambulatory Visit: Payer: Self-pay

## 2018-01-13 ENCOUNTER — Ambulatory Visit (INDEPENDENT_AMBULATORY_CARE_PROVIDER_SITE_OTHER): Payer: Medicaid Other | Admitting: Physician Assistant

## 2018-01-13 VITALS — BP 126/84 | HR 76 | Temp 98.1°F | Wt 386.4 lb

## 2018-01-13 DIAGNOSIS — E118 Type 2 diabetes mellitus with unspecified complications: Secondary | ICD-10-CM

## 2018-01-13 DIAGNOSIS — E875 Hyperkalemia: Secondary | ICD-10-CM | POA: Diagnosis not present

## 2018-01-13 DIAGNOSIS — M5442 Lumbago with sciatica, left side: Secondary | ICD-10-CM | POA: Diagnosis not present

## 2018-01-13 DIAGNOSIS — R202 Paresthesia of skin: Secondary | ICD-10-CM

## 2018-01-13 DIAGNOSIS — J45909 Unspecified asthma, uncomplicated: Secondary | ICD-10-CM

## 2018-01-13 DIAGNOSIS — L02214 Cutaneous abscess of groin: Secondary | ICD-10-CM | POA: Diagnosis not present

## 2018-01-13 DIAGNOSIS — Z760 Encounter for issue of repeat prescription: Secondary | ICD-10-CM | POA: Diagnosis not present

## 2018-01-13 DIAGNOSIS — G8929 Other chronic pain: Secondary | ICD-10-CM

## 2018-01-13 DIAGNOSIS — R200 Anesthesia of skin: Secondary | ICD-10-CM

## 2018-01-13 DIAGNOSIS — I10 Essential (primary) hypertension: Secondary | ICD-10-CM | POA: Diagnosis not present

## 2018-01-13 LAB — POCT GLYCOSYLATED HEMOGLOBIN (HGB A1C): HEMOGLOBIN A1C: 11.7

## 2018-01-13 MED ORDER — INSULIN ASPART 100 UNIT/ML ~~LOC~~ SOLN: SUBCUTANEOUS | 11 refills | Status: DC

## 2018-01-13 MED ORDER — DULOXETINE HCL 30 MG PO CPEP: 30.0000 mg | ORAL_CAPSULE | Freq: Every day | ORAL | 11 refills | Status: DC

## 2018-01-13 MED ORDER — GABAPENTIN 300 MG PO CAPS: 600.0000 mg | ORAL_CAPSULE | Freq: Three times a day (TID) | ORAL | 11 refills | Status: DC

## 2018-01-13 MED ORDER — NITROGLYCERIN 0.4 MG SL SUBL: 0.4000 mg | SUBLINGUAL_TABLET | SUBLINGUAL | 11 refills | Status: DC | PRN

## 2018-01-13 MED ORDER — MELOXICAM 15 MG PO TABS: ORAL_TABLET | ORAL | 1 refills | Status: DC

## 2018-01-13 MED ORDER — AMLODIPINE BESYLATE 5 MG PO TABS: 5.0000 mg | ORAL_TABLET | Freq: Every day | ORAL | 11 refills | Status: DC

## 2018-01-13 MED ORDER — ASPIRIN 81 MG PO TABS: 81.0000 mg | ORAL_TABLET | Freq: Every day | ORAL | 11 refills | Status: AC

## 2018-01-13 MED ORDER — HYDROCHLOROTHIAZIDE 25 MG PO TABS: 25.0000 mg | ORAL_TABLET | Freq: Every day | ORAL | 11 refills | Status: DC

## 2018-01-13 MED ORDER — SULFAMETHOXAZOLE-TRIMETHOPRIM 800-160 MG PO TABS: 1.0000 | ORAL_TABLET | Freq: Two times a day (BID) | ORAL | 0 refills | Status: DC

## 2018-01-13 MED ORDER — METHOCARBAMOL 750 MG PO TABS: 750.0000 mg | ORAL_TABLET | Freq: Every day | ORAL | 11 refills | Status: DC

## 2018-01-13 MED ORDER — ALBUTEROL SULFATE HFA 108 (90 BASE) MCG/ACT IN AERS: INHALATION_SPRAY | RESPIRATORY_TRACT | 11 refills | Status: DC

## 2018-01-13 MED ORDER — INSULIN GLARGINE 100 UNIT/ML SOLOSTAR PEN: PEN_INJECTOR | SUBCUTANEOUS | 11 refills | Status: DC

## 2018-01-13 MED ORDER — ATORVASTATIN CALCIUM 80 MG PO TABS: 80.0000 mg | ORAL_TABLET | Freq: Every day | ORAL | 11 refills | Status: DC

## 2018-01-13 MED ORDER — LOSARTAN POTASSIUM 25 MG PO TABS: 25.0000 mg | ORAL_TABLET | Freq: Every day | ORAL | 11 refills | Status: DC

## 2018-01-13 MED ORDER — METOPROLOL TARTRATE 50 MG PO TABS: 50.0000 mg | ORAL_TABLET | Freq: Two times a day (BID) | ORAL | 11 refills | Status: DC

## 2018-01-13 MED ORDER — EMPAGLIFLOZIN 10 MG PO TABS: 10.0000 mg | ORAL_TABLET | Freq: Every day | ORAL | 11 refills | Status: DC

## 2018-01-13 MED ORDER — MELATONIN 10 MG PO TABS: 3.0000 | ORAL_TABLET | Freq: Every day | ORAL | 2 refills | Status: DC

## 2018-01-13 NOTE — Patient Instructions (Signed)
Diabetes Mellitus and Nutrition When you have diabetes (diabetes mellitus), it is very important to have healthy eating habits because your blood sugar (glucose) levels are greatly affected by what you eat and drink. Eating healthy foods in the appropriate amounts, at about the same times every day, can help you:  Control your blood glucose.  Lower your risk of heart disease.  Improve your blood pressure.  Reach or maintain a healthy weight.  Every person with diabetes is different, and each person has different needs for a meal plan. Your health care provider may recommend that you work with a diet and nutrition specialist (dietitian) to make a meal plan that is best for you. Your meal plan may vary depending on factors such as:  The calories you need.  The medicines you take.  Your weight.  Your blood glucose, blood pressure, and cholesterol levels.  Your activity level.  Other health conditions you have, such as heart or kidney disease.  How do carbohydrates affect me? Carbohydrates affect your blood glucose level more than any other type of food. Eating carbohydrates naturally increases the amount of glucose in your blood. Carbohydrate counting is a method for keeping track of how many carbohydrates you eat. Counting carbohydrates is important to keep your blood glucose at a healthy level, especially if you use insulin or take certain oral diabetes medicines. It is important to know how many carbohydrates you can safely have in each meal. This is different for every person. Your dietitian can help you calculate how many carbohydrates you should have at each meal and for snack. Foods that contain carbohydrates include:  Bread, cereal, rice, pasta, and crackers.  Potatoes and corn.  Peas, beans, and lentils.  Milk and yogurt.  Fruit and juice.  Desserts, such as cakes, cookies, ice cream, and candy.  How does alcohol affect me? Alcohol can cause a sudden decrease in blood  glucose (hypoglycemia), especially if you use insulin or take certain oral diabetes medicines. Hypoglycemia can be a life-threatening condition. Symptoms of hypoglycemia (sleepiness, dizziness, and confusion) are similar to symptoms of having too much alcohol. If your health care provider says that alcohol is safe for you, follow these guidelines:  Limit alcohol intake to no more than 1 drink per day for nonpregnant women and 2 drinks per day for men. One drink equals 12 oz of beer, 5 oz of wine, or 1 oz of hard liquor.  Do not drink on an empty stomach.  Keep yourself hydrated with water, diet soda, or unsweetened iced tea.  Keep in mind that regular soda, juice, and other mixers may contain a lot of sugar and must be counted as carbohydrates.  What are tips for following this plan? Reading food labels  Start by checking the serving size on the label. The amount of calories, carbohydrates, fats, and other nutrients listed on the label are based on one serving of the food. Many foods contain more than one serving per package.  Check the total grams (g) of carbohydrates in one serving. You can calculate the number of servings of carbohydrates in one serving by dividing the total carbohydrates by 15. For example, if a food has 30 g of total carbohydrates, it would be equal to 2 servings of carbohydrates.  Check the number of grams (g) of saturated and trans fats in one serving. Choose foods that have low or no amount of these fats.  Check the number of milligrams (mg) of sodium in one serving. Most people   should limit total sodium intake to less than 2,300 mg per day.  Always check the nutrition information of foods labeled as "low-fat" or "nonfat". These foods may be higher in added sugar or refined carbohydrates and should be avoided.  Talk to your dietitian to identify your daily goals for nutrients listed on the label. Shopping  Avoid buying canned, premade, or processed foods. These  foods tend to be high in fat, sodium, and added sugar.  Shop around the outside edge of the grocery store. This includes fresh fruits and vegetables, bulk grains, fresh meats, and fresh dairy. Cooking  Use low-heat cooking methods, such as baking, instead of high-heat cooking methods like deep frying.  Cook using healthy oils, such as olive, canola, or sunflower oil.  Avoid cooking with butter, cream, or high-fat meats. Meal planning  Eat meals and snacks regularly, preferably at the same times every day. Avoid going long periods of time without eating.  Eat foods high in fiber, such as fresh fruits, vegetables, beans, and whole grains. Talk to your dietitian about how many servings of carbohydrates you can eat at each meal.  Eat 4-6 ounces of lean protein each day, such as lean meat, chicken, fish, eggs, or tofu. 1 ounce is equal to 1 ounce of meat, chicken, or fish, 1 egg, or 1/4 cup of tofu.  Eat some foods each day that contain healthy fats, such as avocado, nuts, seeds, and fish. Lifestyle   Check your blood glucose regularly.  Exercise at least 30 minutes 5 or more days each week, or as told by your health care provider.  Take medicines as told by your health care provider.  Do not use any products that contain nicotine or tobacco, such as cigarettes and e-cigarettes. If you need help quitting, ask your health care provider.  Work with a counselor or diabetes educator to identify strategies to manage stress and any emotional and social challenges. What are some questions to ask my health care provider?  Do I need to meet with a diabetes educator?  Do I need to meet with a dietitian?  What number can I call if I have questions?  When are the best times to check my blood glucose? Where to find more information:  American Diabetes Association: diabetes.org/food-and-fitness/food  Academy of Nutrition and Dietetics:  www.eatright.org/resources/health/diseases-and-conditions/diabetes  National Institute of Diabetes and Digestive and Kidney Diseases (NIH): www.niddk.nih.gov/health-information/diabetes/overview/diet-eating-physical-activity Summary  A healthy meal plan will help you control your blood glucose and maintain a healthy lifestyle.  Working with a diet and nutrition specialist (dietitian) can help you make a meal plan that is best for you.  Keep in mind that carbohydrates and alcohol have immediate effects on your blood glucose levels. It is important to count carbohydrates and to use alcohol carefully. This information is not intended to replace advice given to you by your health care provider. Make sure you discuss any questions you have with your health care provider. Document Released: 09/03/2005 Document Revised: 01/11/2017 Document Reviewed: 01/11/2017 Elsevier Interactive Patient Education  2018 [employer] Inc.  

## 2018-01-13 NOTE — Progress Notes (Signed)
Subjective:  Patient ID: Lindsay [name], female    DOB: March 18, 1981  Age: 37 y.o. MRN: [id number]  CC: f/u groin abscess  HPI  Lindsay [name] is a 37 y.o. female with a PMH of asthma, acute renal failure, CAD, DM2, HLD, HTN, NSTEMI, PCI with DES stent, and Morbid obesity. Last A1c 8.2% 5 months ago. A1c 11.7% in clinic today. Been with bariatric weight loss for 3-4 months. Says she "fell off" her diet. Plans to return to healthier eating and stricter adherence to advise given at weight loss clinic. Does not endorse any symptoms or complaints beside the pain felt from her right inner thigh abscess. Had the abscess lanced at the ED and prescribed abx 12 days ago but there is still copious suppuration and a large tender mass in the right inner thigh.     Outpatient Medications Prior to Visit  Medication Sig Dispense Refill  . amLODipine (NORVASC) 5 MG tablet Take 1 tablet (5 mg total) by mouth daily. 90 tablet 1  . aspirin 81 MG tablet Take 81 mg by mouth daily.    Marland Kitchen aspirin-acetaminophen-caffeine (EXCEDRIN MIGRAINE) 250-250-65 MG tablet Take 2 tablets by mouth every 6 (six) hours as needed for headache.    Marland Kitchen atorvastatin (LIPITOR) 80 MG tablet Take 1 tablet (80 mg total) by mouth daily at 6 PM. Please call and schedule November/December follow up. [phone] 90 tablet 1  . Camphor-Eucalyptus-Menthol (VICKS VAPORUB EX) Apply 1 application topically as needed (Congestion).    Marland Kitchen diclofenac sodium (VOLTAREN) 1 % GEL Apply 2 g topically 4 (four) times daily. (Patient taking differently: Apply 2 g topically 4 (four) times daily as needed (Pain). ) 300 g 1  . diphenhydrAMINE (BENADRYL) 25 MG tablet Take 1 tablet (25 mg total) by mouth every 6 (six) hours as needed. 30 tablet 0  . DULoxetine (CYMBALTA) 30 MG capsule TAKE 1 CAPSULE BY MOUTH ONCE DAILY 30 capsule 3  . empagliflozin (JARDIANCE) 10 MG TABS tablet Take 10 mg by mouth daily with breakfast. 30 tablet 3  . gabapentin (NEURONTIN) 300 MG  capsule Take 2 capsules (600 mg total) by mouth 4 (four) times daily. 240 capsule 3  . GARLIC PO Take 1 tablet by mouth 2 (two) times daily.    . hydrochlorothiazide (HYDRODIURIL) 25 MG tablet Take 1 tablet (25 mg total) by mouth daily. Take on tablet in the morning. 90 tablet 1  . ibuprofen (ADVIL,MOTRIN) 200 MG tablet Take 400 mg by mouth every 6 (six) hours as needed for fever or moderate pain.    . Insulin Glargine (LANTUS SOLOSTAR) 100 UNIT/ML Solostar Pen Inject 60 units at bedtime. (Patient taking differently: Inject 32.5 Units into the skin 2 (two) times daily. ) 10 pen 1  . insulin regular human CONCENTRATED (HUMULIN R) 500 UNIT/ML injection Inject 10-25 Units into the skin 3 (three) times daily with meals.     Marland Kitchen losartan (COZAAR) 25 MG tablet Take 1 tablet (25 mg total) by mouth daily. 90 tablet 1  . Magnesium 250 MG TABS Take 1 tablet by mouth 2 (two) times daily.    . Melatonin 10 MG TABS Take 3 tablets by mouth at bedtime.    . meloxicam (MOBIC) 15 MG tablet TAKE ONE TABLET BY MOUTH ONCE DAILY .MUST  HAVE  OFFICE  VISIT  FOR  REFILLS 30 tablet 1  . methocarbamol (ROBAXIN) 750 MG tablet TAKE 1 TABLET BY MOUTH THREE TIMES DAILY 90 tablet 1  . metoprolol (LOPRESSOR) 50  MG tablet Take 1 tablet (50 mg total) by mouth 2 (two) times daily. 180 tablet 1  . Multiple Vitamins-Minerals (MULTIVITAMIN PO) Take 1 tablet by mouth daily.    . nitroGLYCERIN (NITROSTAT) 0.4 MG SL tablet Place 1 tablet (0.4 mg total) under the tongue every 5 (five) minutes as needed for chest pain. 25 tablet 3  . PROVENTIL HFA 108 (90 Base) MCG/ACT inhaler INHALE 2 PUFFS BY MOUTH EVERY 6 HOURS AS NEEDED FOR WHEEZING OR SHORTNESS OF BREATH 7 each 2  . sodium chloride (OCEAN) 0.65 % SOLN nasal spray Place 1 spray into both nostrils as needed for congestion. 1 Bottle 0  . traMADol (ULTRAM) 50 MG tablet Take 1 tablet (50 mg total) by mouth 2 (two) times daily. (Patient taking differently: Take 50 mg by mouth 2 (two) times  daily as needed for moderate pain. ) 60 tablet 1  . exenatide (BYETTA 10 MCG PEN) 10 MCG/0.04ML SOPN injection Inject 0.04 mLs (10 mcg total) into the skin 2 (two) times daily with a meal. Take 30 minutes before breakfast and lunch (Patient not taking: Reported on 01/01/2018) 1 pen 2  . oxyCODONE (ROXICODONE) 5 MG immediate release tablet Take 1 tablet (5 mg total) by mouth every 4 (four) hours as needed for severe pain. (Patient not taking: Reported on 01/13/2018) 15 tablet 0   No facility-administered medications prior to visit.      ROS Review of Systems  Constitutional: Negative for chills, fever and malaise/fatigue.  Eyes: Negative for blurred vision.  Respiratory: Negative for shortness of breath.   Cardiovascular: Negative for chest pain and palpitations.  Gastrointestinal: Negative for abdominal pain and nausea.  Genitourinary: Negative for dysuria and hematuria.  Musculoskeletal: Negative for joint pain and myalgias.  Skin: Negative for rash.       abscess  Neurological: Negative for tingling and headaches.  Psychiatric/Behavioral: Negative for depression. The patient is not nervous/anxious.     Objective:  BP 126/84 (BP Location: Right Arm, Patient Position: Sitting, Cuff Size: Large)   Pulse 76   Temp 98.1 F (36.7 C) (Oral)   Wt (!) 386 lb 6.4 oz (175.3 kg)   SpO2 95%   BMI 59.63 kg/m   BP/Weight 01/13/2018 01/01/2018 12/30/2017  Systolic BP 126 162 151  Diastolic BP 84 91 95  Wt. (Lbs) 386.4 - -  BMI 59.63 - -      Physical Exam  Constitutional: She is oriented to person, place, and time.  Well developed, morbidly obese, NAD, polite  HENT:  Head: Normocephalic and atraumatic.  Eyes: No scleral icterus.  Neck: Normal range of motion. Neck supple. No thyromegaly present.  Cardiovascular: Normal rate, regular rhythm and normal heart sounds.  Pulmonary/Chest: Effort normal and breath sounds normal.  Abdominal: Soft. Bowel sounds are normal. There is no  tenderness.  Musculoskeletal: She exhibits no edema.  Neurological: She is alert and oriented to person, place, and time.  Skin:  Large and indurated abscess that is self draining on the left inner thigh  Psychiatric: She has a normal mood and affect. Her behavior is normal. Thought content normal.  Vitals reviewed.    Assessment & Plan:   . 1. Diabetes mellitus with complication (HCC) - HgB A1c 11.7% in clinic today - Basic Metabolic Panel  2. Hyperkalemia - Basic Metabolic Panel  3. Medication refill - Refilled as below  4. Uncomplicated asthma, unspecified asthma severity, unspecified whether persistent - Refill albuterol (PROVENTIL HFA) 108 (90 Base) MCG/ACT inhaler; INHALE 2  PUFFS BY MOUTH EVERY 6 HOURS AS NEEDED FOR WHEEZING OR SHORTNESS OF BREATH  Dispense: 1 each; Refill: 11  5. Essential hypertension - Refill metoprolol tartrate (LOPRESSOR) 50 MG tablet; Take 1 tablet (50 mg total) by mouth 2 (two) times daily.  Dispense: 60 tablet; Refill: 11 - Refill losartan (COZAAR) 25 MG tablet; Take 1 tablet (25 mg total) by mouth daily.  Dispense: 30 tablet; Refill: 11 - Refill hydrochlorothiazide (HYDRODIURIL) 25 MG tablet; Take 1 tablet (25 mg total) by mouth daily. Take on tablet in the morning.  Dispense: 30 tablet; Refill: 11 - Refill amLODipine (NORVASC) 5 MG tablet; Take 1 tablet (5 mg total) by mouth daily.  Dispense: 30 tablet; Refill: 11  6. Chronic midline low back pain with left-sided sciatica - Refill meloxicam (MOBIC) 15 MG tablet; TAKE ONE TABLET BY MOUTH ONCE DAILY .MUST  HAVE  OFFICE  VISIT  FOR  REFILLS  Dispense: 30 tablet; Refill: 1  7. Numbness and tingling - Refill gabapentin (NEURONTIN) 300 MG capsule; Take 2 capsules (600 mg total) by mouth 3 (three) times daily.  Dispense: 180 capsule; Refill: 11  8. Abscess of groin, left - Ambulatory referral to General Surgery - Begin sulfamethoxazole-trimethoprim (BACTRIM DS,SEPTRA DS) 800-160 MG tablet; Take 1  tablet by mouth 2 (two) times daily.  Dispense: 20 tablet; Refill: 0 - Cleaned suppuration on the thigh and genitalia. Replaced with clean guaze.   Meds ordered this encounter  Medications  . Insulin Glargine (LANTUS SOLOSTAR) 100 UNIT/ML Solostar Pen    Sig: Inject 60 units at bedtime.    Dispense:  10 pen    Refill:  11    Order Specific Question:   Supervising Provider    Answer:   Quentin Angst [id number]  . empagliflozin (JARDIANCE) 10 MG TABS tablet    Sig: Take 10 mg by mouth daily with breakfast.    Dispense:  30 tablet    Refill:  11    Please consider 90 day supplies to promote better adherence    Order Specific Question:   Supervising Provider    Answer:   Quentin Angst [[id number]]  . insulin aspart (NOVOLOG) 100 UNIT/ML injection    Sig: Use per sliding scale: If sugar 150-200 take 4 units If sugar 201-251 take 6 units If sugar 251-300 take 8 units If sugar 301-350 take 10 units If sugar 351-400 take 12 units    Dispense:  10 mL    Refill:  11    Order Specific Question:   Supervising Provider    Answer:   Quentin Angst [id number]  . albuterol (PROVENTIL HFA) 108 (90 Base) MCG/ACT inhaler    Sig: INHALE 2 PUFFS BY MOUTH EVERY 6 HOURS AS NEEDED FOR WHEEZING OR SHORTNESS OF BREATH    Dispense:  1 each    Refill:  11    Please consider 90 day supplies to promote better adherence    Order Specific Question:   Supervising Provider    Answer:   Quentin Angst [[id number]]  . nitroGLYCERIN (NITROSTAT) 0.4 MG SL tablet    Sig: Place 1 tablet (0.4 mg total) under the tongue every 5 (five) minutes as needed for chest pain.    Dispense:  25 tablet    Refill:  11    Order Specific Question:   Supervising Provider    Answer:   Quentin Angst [id number]  . metoprolol tartrate (LOPRESSOR) 50 MG tablet    Sig: Take 1 tablet (50  mg total) by mouth 2 (two) times daily.    Dispense:  60 tablet    Refill:  11    Order Specific Question:   Supervising  Provider    Answer:   Quentin Angst [id number]  . methocarbamol (ROBAXIN) 750 MG tablet    Sig: Take 1 tablet (750 mg total) by mouth at bedtime.    Dispense:  30 tablet    Refill:  11    Please consider 90 day supplies to promote better adherence    Order Specific Question:   Supervising Provider    Answer:   Quentin Angst [id number]  . meloxicam (MOBIC) 15 MG tablet    Sig: TAKE ONE TABLET BY MOUTH ONCE DAILY .MUST  HAVE  OFFICE  VISIT  FOR  REFILLS    Dispense:  30 tablet    Refill:  1    Please consider 90 day supplies to promote better adherence    Order Specific Question:   Supervising Provider    Answer:   Quentin Angst [[id number]]  . Melatonin 10 MG TABS    Sig: Take 3 tablets by mouth at bedtime.    Dispense:  30 tablet    Refill:  2    Order Specific Question:   Supervising Provider    Answer:   Quentin Angst [id number]  . losartan (COZAAR) 25 MG tablet    Sig: Take 1 tablet (25 mg total) by mouth daily.    Dispense:  30 tablet    Refill:  11    Order Specific Question:   Supervising Provider    Answer:   Quentin Angst [id number]  . hydrochlorothiazide (HYDRODIURIL) 25 MG tablet    Sig: Take 1 tablet (25 mg total) by mouth daily. Take on tablet in the morning.    Dispense:  30 tablet    Refill:  11    Order Specific Question:   Supervising Provider    Answer:   Quentin Angst [id number]  . gabapentin (NEURONTIN) 300 MG capsule    Sig: Take 2 capsules (600 mg total) by mouth 3 (three) times daily.    Dispense:  180 capsule    Refill:  11    Order Specific Question:   Supervising Provider    Answer:   Quentin Angst [id number]  . DULoxetine (CYMBALTA) 30 MG capsule    Sig: Take 1 capsule (30 mg total) by mouth daily.    Dispense:  30 capsule    Refill:  11    Please consider 90 day supplies to promote better adherence    Order Specific Question:   Supervising Provider    Answer:   Quentin Angst [id number]  .  atorvastatin (LIPITOR) 80 MG tablet    Sig: Take 1 tablet (80 mg total) by mouth daily at 6 PM. Please call and schedule November/December follow up. [phone]    Dispense:  30 tablet    Refill:  11    Please consider 90 day supplies to promote better adherence    Order Specific Question:   Supervising Provider    Answer:   Quentin Angst [[id number]]  . aspirin 81 MG tablet    Sig: Take 1 tablet (81 mg total) by mouth daily.    Dispense:  30 tablet    Refill:  11    Order Specific Question:   Supervising Provider    Answer:   Quentin Angst [id number]  . amLODipine (NORVASC) 5 MG  tablet    Sig: Take 1 tablet (5 mg total) by mouth daily.    Dispense:  30 tablet    Refill:  11    Order Specific Question:   Supervising Provider    Answer:   Quentin Angst [id number]  . sulfamethoxazole-trimethoprim (BACTRIM DS,SEPTRA DS) 800-160 MG tablet    Sig: Take 1 tablet by mouth 2 (two) times daily.    Dispense:  20 tablet    Refill:  0    Order Specific Question:   Supervising Provider    Answer:   Quentin Angst [id number]    Follow-up: Return in about 6 weeks (around 02/24/2018) for HTN.   Loletta Specter PA

## 2018-01-27 ENCOUNTER — Encounter: Payer: Self-pay | Admitting: Physical Medicine & Rehabilitation

## 2018-01-27 ENCOUNTER — Ambulatory Visit: Payer: Medicaid Other | Admitting: Physical Medicine & Rehabilitation

## 2018-01-27 ENCOUNTER — Encounter: Payer: Medicaid Other | Attending: Physical Medicine & Rehabilitation

## 2018-01-27 VITALS — BP 134/79 | HR 67 | Resp 14

## 2018-01-27 DIAGNOSIS — E669 Obesity, unspecified: Secondary | ICD-10-CM | POA: Diagnosis not present

## 2018-01-27 DIAGNOSIS — M25561 Pain in right knee: Secondary | ICD-10-CM | POA: Diagnosis not present

## 2018-01-27 DIAGNOSIS — M47816 Spondylosis without myelopathy or radiculopathy, lumbar region: Secondary | ICD-10-CM

## 2018-01-27 DIAGNOSIS — E119 Type 2 diabetes mellitus without complications: Secondary | ICD-10-CM | POA: Diagnosis not present

## 2018-01-27 DIAGNOSIS — Z809 Family history of malignant neoplasm, unspecified: Secondary | ICD-10-CM | POA: Diagnosis not present

## 2018-01-27 DIAGNOSIS — G8929 Other chronic pain: Secondary | ICD-10-CM | POA: Insufficient documentation

## 2018-01-27 DIAGNOSIS — Z833 Family history of diabetes mellitus: Secondary | ICD-10-CM | POA: Insufficient documentation

## 2018-01-27 DIAGNOSIS — Z823 Family history of stroke: Secondary | ICD-10-CM | POA: Insufficient documentation

## 2018-01-27 DIAGNOSIS — Z9889 Other specified postprocedural states: Secondary | ICD-10-CM | POA: Diagnosis not present

## 2018-01-27 DIAGNOSIS — Z8249 Family history of ischemic heart disease and other diseases of the circulatory system: Secondary | ICD-10-CM | POA: Insufficient documentation

## 2018-01-27 DIAGNOSIS — M545 Low back pain: Secondary | ICD-10-CM | POA: Insufficient documentation

## 2018-01-27 DIAGNOSIS — I2510 Atherosclerotic heart disease of native coronary artery without angina pectoris: Secondary | ICD-10-CM | POA: Diagnosis not present

## 2018-01-27 DIAGNOSIS — E785 Hyperlipidemia, unspecified: Secondary | ICD-10-CM | POA: Insufficient documentation

## 2018-01-27 DIAGNOSIS — M25511 Pain in right shoulder: Secondary | ICD-10-CM | POA: Diagnosis not present

## 2018-01-27 DIAGNOSIS — I10 Essential (primary) hypertension: Secondary | ICD-10-CM | POA: Diagnosis not present

## 2018-01-27 DIAGNOSIS — Z955 Presence of coronary angioplasty implant and graft: Secondary | ICD-10-CM | POA: Diagnosis not present

## 2018-01-27 DIAGNOSIS — J45909 Unspecified asthma, uncomplicated: Secondary | ICD-10-CM | POA: Diagnosis not present

## 2018-01-27 DIAGNOSIS — M25562 Pain in left knee: Secondary | ICD-10-CM | POA: Diagnosis not present

## 2018-01-27 DIAGNOSIS — I214 Non-ST elevation (NSTEMI) myocardial infarction: Secondary | ICD-10-CM | POA: Diagnosis not present

## 2018-01-27 NOTE — Patient Instructions (Signed)

## 2018-01-27 NOTE — Progress Notes (Signed)
PROCEDURE RECORD [facility]   Name: Lindsay [name] DOB:11/12/1981 MRN: [id number]  Date:01/27/2018  Physician: Claudette Laws, MD    Nurse/CMA: Esaul Dorwart, CMA  Allergies:  Allergies  Allergen Reactions  . Hydrazine Yellow [Tartrazine] Shortness Of Breath and Swelling    Swelling mostly noticed in legs and feet, retaining urination, shortness of breaht, and minor chest pain  . Lisinopril Shortness Of Breath    Was on prinzide; had sob/chest pain on it.  . Tylenol [Acetaminophen] Itching and Swelling    Itching of the mouth, swelling of tongue and stomach started hurting    Consent Signed: Yes.    Is patient diabetic? Yes.    CBG today? 213  Pregnant: No. LMP: No LMP recorded. Patient is not currently having periods (Reason: Irregular Periods). (age 17-55)  Anticoagulants: no Anti-inflammatory: no Antibiotics: no  Procedure: bilateral L3,4,5 medial branch block (7 in black hub) Position: Prone Start Time: 10:17am  End Time: 10:37am  Fluoro Time: 1:27  RN/CMA Nashid Pellum, CMA Colleene Swarthout, CMA    Time 10:00am 10:42am    BP 134/79 132/90    Pulse 67 72    Respirations 14 14    O2 Sat 97 95    S/S 6 6    Pain Level 7/10 7/10     D/C home with friend, patient A & O X 3, D/C instructions reviewed, and sits independently.

## 2018-01-27 NOTE — Progress Notes (Signed)
Bilateral Lumbar L3, L4  medial branch blocks and L 5 dorsal ramus injection under fluoroscopic guidance  Indication: Lumbar pain which is not relieved by medication management or other conservative care and interfering with self-care and mobility.  Informed consent was obtained after describing risks and benefits of the procedure with the patient, this includes bleeding, infection, paralysis and medication side effects.  The patient wishes to proceed and has given written consent.  The patient was placed in prone position.  The lumbar area was marked and prepped with Betadine.  One mL of 1% lidocaine was injected into each of 6 areas into the skin and subcutaneous tissue.  Then a 22-gauge 7in spinal needle was inserted targeting the junction of the left S1 superior articular process and sacral ala junction. Needle was advanced under fluoroscopic guidance.  Bone contact was made.  Isovue 200 was injected x 0.5 mL demonstrating no intravascular uptake.  Then a solution  of 2% MPF lidocaine was injected x 0.5 mL.  Then the left L5 superior articular process in transverse process junction was targeted.  Bone contact was made.  Isovue 200 was injected x 0.5 mL demonstrating no intravascular uptake. Then a solution containing  2% MPF lidocaine was injected x 0.5 mL.  Then the left L4 superior articular process in transverse process junction was targeted.  Bone contact was made.  Isovue 200 was injected x 0.5 mL demonstrating no intravascular uptake.  Then a solution containing2% MPF lidocaine was injected x 0.5 mL.  This same procedure was performed on the right side using the same needle, technique and injectate.  Patient tolerated procedure well.  Post procedure instructions were given. 

## 2018-02-10 ENCOUNTER — Encounter: Payer: Self-pay | Admitting: Physical Medicine & Rehabilitation

## 2018-02-10 ENCOUNTER — Ambulatory Visit: Payer: Medicaid Other | Admitting: Physical Medicine & Rehabilitation

## 2018-02-10 VITALS — BP 159/98 | HR 79

## 2018-02-10 DIAGNOSIS — M47816 Spondylosis without myelopathy or radiculopathy, lumbar region: Secondary | ICD-10-CM | POA: Diagnosis not present

## 2018-02-10 DIAGNOSIS — G8929 Other chronic pain: Secondary | ICD-10-CM | POA: Diagnosis not present

## 2018-02-10 NOTE — Progress Notes (Signed)
Subjective:    Patient ID: Lindsay [name], female    DOB: 01-Aug-1981, 37 y.o.   MRN: [id number]  HPI Relief of 50% on Right side of back on 3 occassions following bilateral L3-L4 MBB, L5 dorsal ramus injection Her left-sided low back pain relief has persisted  No new medical issues.  Primary care physician is prescribing methocarbamol as well as tramadol Pain Inventory Average Pain 6 Pain Right Now 8 My pain is constant and aching  In the last 24 hours, has pain interfered with the following? General activity 9 Relation with others 1 Enjoyment of life 9 What TIME of day is your pain at its worst? morning Sleep (in general) Poor  Pain is worse with: walking, bending and some activites Pain improves with: rest, heat/ice, therapy/exercise, pacing activities, medication and injections Relief from Meds: 8  Mobility walk without assistance ability to climb steps?  yes do you drive?  no  Function not employed: date last employed . I need assistance with the following:  dressing, bathing, meal prep and household duties  Neuro/Psych numbness tingling spasms anxiety  Prior Studies Any changes since last visit?  no  Physicians involved in your care Any changes since last visit?  no   Family History  Problem Relation Age of Onset  . Diabetes Mother   . Hypertension Mother   . Heart disease Mother   . Diabetes Father   . Heart disease Father   . Stroke Maternal Grandmother   . Cancer Maternal Grandmother    Social History   Socioeconomic History  . Marital status: Single    Spouse name: None  . Number of children: None  . Years of education: None  . Highest education level: None  Social Needs  . Financial resource strain: None  . Food insecurity - worry: None  . Food insecurity - inability: None  . Transportation needs - medical: None  . Transportation needs - non-medical: None  Occupational History  . None  Tobacco Use  . Smoking status: Never Smoker   . Smokeless tobacco: Never Used  Substance and Sexual Activity  . Alcohol use: Yes    Comment: socially  . Drug use: No  . Sexual activity: None  Other Topics Concern  . None  Social History Narrative  . None   Past Surgical History:  Procedure Laterality Date  . CARDIAC CATHETERIZATION N/A 09/07/2016   Procedure: Left Heart Cath and Coronary Angiography;  Surgeon: Lyn Records, MD;  Location: [facility] INVASIVE CV LAB;  Service: Cardiovascular;  Laterality: N/A;  . CARDIAC CATHETERIZATION N/A 09/07/2016   Procedure: Coronary Stent Intervention;  Surgeon: Lyn Records, MD;  Location: [facility] INVASIVE CV LAB;  Service: Cardiovascular;  Laterality: N/A;  . CARDIAC CATHETERIZATION N/A 09/07/2016   Procedure: Coronary Balloon Angioplasty;  Surgeon: Lyn Records, MD;  Location: [facility] INVASIVE CV LAB;  Service: Cardiovascular;  Laterality: N/A;  . CESAREAN SECTION    . IRRIGATION AND DEBRIDEMENT SHOULDER Right 04/30/2015   Procedure: IRRIGATION AND DEBRIDEMENT SHOULDER;  Surgeon: Sheral Apley, MD;  Location: [facility] OR;  Service: Orthopedics;  Laterality: Right;  . IRRIGATION AND DEBRIDEMENT SHOULDER Right 05/01/2015  . LEG SURGERY    . SHOULDER ARTHROSCOPY Right 04/30/2015   Procedure: ARTHROSCOPY SHOULDER;  Surgeon: Sheral Apley, MD;  Location: [facility] OR;  Service: Orthopedics;  Laterality: Right;  . TONSILLECTOMY     Past Medical History:  Diagnosis Date  . ARF (acute renal failure) (HCC) 04/2015  . Asthma   .  Cellulitis of right upper extremity   . Coronary artery disease   . Diabetes mellitus    insulin dependent  . Hyperlipidemia LDL goal <70   . Hypertension   . NSTEMI (non-ST elevated myocardial infarction) (HCC) 08/2016  . Obesity   . S/P angioplasty with stent 08/2016   DES to mLAD and PTCA only to 2nd diag ostium.    BP (!) 159/98   Pulse 79   SpO2 98%   Opioid Risk Score:   Fall Risk Score:  `1  Depression screen PHQ 2/9  Depression screen [facility] 2/9 01/13/2018 11/08/2017  05/28/2017 04/27/2017 04/13/2017 02/24/2017 02/19/2017  Decreased Interest 0 0 0 0 0 0 0  Down, Depressed, Hopeless 0 0 0 0 0 0 0  PHQ - 2 Score 0 0 0 0 0 0 0     Review of Systems  Constitutional: Negative.   HENT: Negative.   Eyes: Negative.   Respiratory: Positive for shortness of breath and wheezing.   Cardiovascular: Negative.   Gastrointestinal: Negative.   Endocrine: Negative.   Genitourinary: Negative.   Musculoskeletal: Positive for arthralgias, back pain, gait problem and myalgias.  Skin: Negative.   Allergic/Immunologic: Negative.   Hematological: Negative.   Psychiatric/Behavioral: The patient is nervous/anxious.   All other systems reviewed and are negative.      Objective:   Physical Exam  Constitutional: She is oriented to person, place, and time. She appears well-developed and well-nourished.  HENT:  Head: Normocephalic and atraumatic.  Eyes: Conjunctivae and EOM are normal. Pupils are equal, round, and reactive to light.  Neurological: She is alert and oriented to person, place, and time.  Psychiatric: She has a normal mood and affect.  Nursing note and vitals reviewed. No tenderness palpation the lumbar paraspinals.  She does have some pain with right lateral bending is limited range of  motion due to her body habitus. Gait without evidence of toe drag or knee instability Negative straight leg raise       Assessment & Plan:  #1.  Lumbar spondylosis without myelopathy.  She has responded to bilateral medial branch blocks, L3-4-5 her left-sided symptoms have diminished however her right-sided symptoms had only a temporary relief.  Would recommend right L3-L4 medial branch and right L5 dorsal ramus radiofrequency neurotomy under fluoroscopic guidance.  As discussed with the patient because of her body habitus may be difficult getting full neural lysis since the longest radiofrequency needle is 15 cm.  May have to adjust angle of approach to maximize coverage.   Discussed with patient agrees with plan

## 2018-02-10 NOTE — Patient Instructions (Signed)
Radiofrequency Lesioning Radiofrequency lesioning is a procedure that is performed to relieve pain. The procedure is often used for back, neck, or arm pain. Radiofrequency lesioning involves the use of a machine that creates radio waves to make heat. During the procedure, the heat is applied to the nerve that carries the pain signal. The heat damages the nerve and interferes with the pain signal. Pain relief usually starts about 2 weeks after the procedure and lasts for 6 months to 1 year. Tell a health care provider about:  Any allergies you have.  All medicines you are taking, including vitamins, herbs, eye drops, creams, and over-the-counter medicines.  Any problems you or family members have had with anesthetic medicines.  Any blood disorders you have.  Any surgeries you have had.  Any medical conditions you have.  Whether you are pregnant or may be pregnant. What are the risks? Generally, this is a safe procedure. However, problems may occur, including:  Pain or soreness at the injection site.  Infection at the injection site.  Damage to nerves or blood vessels.  What happens before the procedure?  Ask your health care provider about: ? Changing or stopping your regular medicines. This is especially important if you are taking diabetes medicines or blood thinners. ? Taking medicines such as aspirin and ibuprofen. These medicines can thin your blood. Do not take these medicines before your procedure if your health care provider instructs you not to.  Follow instructions from your health care provider about eating or drinking restrictions.  Plan to have someone take you home after the procedure.  If you go home right after the procedure, plan to have someone with you for 24 hours. What happens during the procedure?  You will be given one or more of the following: ? A medicine to help you relax (sedative). ? A medicine to numb the area (local anesthetic).  You will be  awake during the procedure. You will need to be able to talk with the health care provider during the procedure.  With the help of a type of X-ray (fluoroscopy), the health care provider will insert a radiofrequency needle into the area to be treated.  Next, a wire that carries the radio waves (electrode) will be put through the radiofrequency needle. An electrical pulse will be sent through the electrode to verify the correct nerve. You will feel a tingling sensation, and you may have muscle twitching.  Then, the tissue that is around the needle tip will be heated by an electric current that is passed using the radiofrequency machine. This will numb the nerves.  A bandage (dressing) will be put on the insertion area after the procedure is done. The procedure may vary among health care providers and hospitals. What happens after the procedure?  Your blood pressure, heart rate, breathing rate, and blood oxygen level will be monitored often until the medicines you were given have worn off.  Return to your normal activities as directed by your health care provider. This information is not intended to replace advice given to you by your health care provider. Make sure you discuss any questions you have with your health care provider. Document Released: 08/05/2011 Document Revised: 05/14/2016 Document Reviewed: 01/14/2015 Elsevier Interactive Patient Education  2018 [employer] Inc[employer]  

## 2018-02-22 ENCOUNTER — Other Ambulatory Visit: Payer: Self-pay | Admitting: Physical Medicine & Rehabilitation

## 2018-02-25 ENCOUNTER — Ambulatory Visit (INDEPENDENT_AMBULATORY_CARE_PROVIDER_SITE_OTHER): Payer: Medicaid Other | Admitting: Physician Assistant

## 2018-03-01 ENCOUNTER — Encounter: Payer: Medicaid Other | Attending: Physical Medicine & Rehabilitation

## 2018-03-01 ENCOUNTER — Encounter: Payer: Self-pay | Admitting: Physical Medicine & Rehabilitation

## 2018-03-01 ENCOUNTER — Ambulatory Visit: Payer: Medicaid Other | Admitting: Physical Medicine & Rehabilitation

## 2018-03-01 VITALS — BP 146/94 | HR 88 | Resp 14

## 2018-03-01 DIAGNOSIS — I10 Essential (primary) hypertension: Secondary | ICD-10-CM | POA: Diagnosis not present

## 2018-03-01 DIAGNOSIS — E119 Type 2 diabetes mellitus without complications: Secondary | ICD-10-CM | POA: Diagnosis not present

## 2018-03-01 DIAGNOSIS — Z823 Family history of stroke: Secondary | ICD-10-CM | POA: Insufficient documentation

## 2018-03-01 DIAGNOSIS — Z8249 Family history of ischemic heart disease and other diseases of the circulatory system: Secondary | ICD-10-CM | POA: Insufficient documentation

## 2018-03-01 DIAGNOSIS — E785 Hyperlipidemia, unspecified: Secondary | ICD-10-CM | POA: Diagnosis not present

## 2018-03-01 DIAGNOSIS — M25562 Pain in left knee: Secondary | ICD-10-CM | POA: Insufficient documentation

## 2018-03-01 DIAGNOSIS — I2510 Atherosclerotic heart disease of native coronary artery without angina pectoris: Secondary | ICD-10-CM | POA: Insufficient documentation

## 2018-03-01 DIAGNOSIS — Z809 Family history of malignant neoplasm, unspecified: Secondary | ICD-10-CM | POA: Diagnosis not present

## 2018-03-01 DIAGNOSIS — Z955 Presence of coronary angioplasty implant and graft: Secondary | ICD-10-CM | POA: Diagnosis not present

## 2018-03-01 DIAGNOSIS — J45909 Unspecified asthma, uncomplicated: Secondary | ICD-10-CM | POA: Diagnosis not present

## 2018-03-01 DIAGNOSIS — Z833 Family history of diabetes mellitus: Secondary | ICD-10-CM | POA: Diagnosis not present

## 2018-03-01 DIAGNOSIS — M25511 Pain in right shoulder: Secondary | ICD-10-CM | POA: Diagnosis not present

## 2018-03-01 DIAGNOSIS — M47816 Spondylosis without myelopathy or radiculopathy, lumbar region: Secondary | ICD-10-CM

## 2018-03-01 DIAGNOSIS — I214 Non-ST elevation (NSTEMI) myocardial infarction: Secondary | ICD-10-CM | POA: Insufficient documentation

## 2018-03-01 DIAGNOSIS — Z9889 Other specified postprocedural states: Secondary | ICD-10-CM | POA: Insufficient documentation

## 2018-03-01 DIAGNOSIS — G8929 Other chronic pain: Secondary | ICD-10-CM | POA: Insufficient documentation

## 2018-03-01 DIAGNOSIS — E669 Obesity, unspecified: Secondary | ICD-10-CM | POA: Insufficient documentation

## 2018-03-01 DIAGNOSIS — M545 Low back pain: Secondary | ICD-10-CM | POA: Insufficient documentation

## 2018-03-01 DIAGNOSIS — M25561 Pain in right knee: Secondary | ICD-10-CM | POA: Diagnosis not present

## 2018-03-01 NOTE — Progress Notes (Signed)
Left Lumbar L3, L4  medial branch blocks and L 5 dorsal ramus injection under fluoroscopic guidance   Indication: Left Lumbar pain which is not relieved by medication management or other conservative care and interfering with self-care and mobility.  Informed consent was obtained after describing risks and benefits of the procedure with the patient, this includes bleeding, bruising, infection, paralysis and medication side effects.  The patient wishes to proceed and has given written consent.  The patient was placed in a prone position.  The lumbar area was marked and prepped with Betadine.  One mL of 1% lidocaine was injected into each of 3 areas into the skin and subcutaneous tissue.  Then a 20-gauge 15cm RF spinal needle was inserted targeting the junction of the left S1 superior articular process and sacral ala junction.  Needle was advanced under fluoroscopic guidance.  Bone contact was made. Isovue 200 was injected x 0.5 mL demonstrating no intravascular uptake.  Then a solution containing  2% MPF lidocaine was injected x 0.5 mL.  Then the left L5 superior articular process in transverse process junction was targeted.15cm needle did not contact target therefore 7in Quincke needle was used  Bone contact was made. Isovue 200 was injected x 0.5 mL demonstrating no intravascular uptake.  Then a solution containing 2% MPF lidocaine was injected x 0.5 mL.  Then the left L4 superior articular process in transverse process junction was targeted.  Bone contact was made.  Isovue 200 was injected x 0.5 mL demonstrating no intravascular uptake.  Then a solution containing  2% MPF lidocaine was injected x 0.5 mL.  Patient tolerated procedure well.  Post procedure instructions were given.  Pt cannot have RF procedure due to obesity and lack of RF needle and probe with adequate length

## 2018-03-01 NOTE — Progress Notes (Signed)
  PROCEDURE RECORD [facility]   Name: Lindsay [name] DOB:[birth year] MRN: [id number]  Date:03/01/2018  Physician: Claudette Laws, MD    Nurse/CMA: Bright CMA  Allergies:  Allergies  Allergen Reactions  . Hydrazine Yellow [Tartrazine] Shortness Of Breath and Swelling    Swelling mostly noticed in legs and feet, retaining urination, shortness of breaht, and minor chest pain  . Lisinopril Shortness Of Breath    Was on prinzide; had sob/chest pain on it.  . Tylenol [Acetaminophen] Itching and Swelling    Itching of the mouth, swelling of tongue and stomach started hurting    Consent Signed: Yes.    Is patient diabetic? Yes.    CBG today? 217  Pregnant: No. LMP: No LMP recorded. Patient is not currently having periods (Reason: Irregular Periods). (age 93-55)  Anticoagulants: no Anti-inflammatory: no Antibiotics: no  Procedure: Left L3-4 MBB / L5 Dorsal Ramus Position: Prone   Start Time:  1047am  End Time: 1105am   Fluoro Time:  58s  RN/CMA Wessling CMA Bright CMA    Time 1012am 1106am    BP 146/94 153/89    Pulse 88 88    Respirations 14 14    O2 Sat 94 96    S/S 6 6    Pain Level 10/10 0/10     D/C home with Friend, patient A & O X 3, D/C instructions reviewed, and sits independently.

## 2018-03-01 NOTE — Patient Instructions (Signed)
Lumbar medial branch blocks were performed. This is to help diagnose the cause of the low back pain. It is important that you keep track of your pain for the first day or 2 after injection. This injection can give you temporary relief that lasts for hours or up to several months. There is no way to predict duration of pain relief.  Please try to compare your pain after injection to for the injection.   

## 2018-03-29 ENCOUNTER — Encounter: Payer: Self-pay | Admitting: Physical Medicine & Rehabilitation

## 2018-03-29 ENCOUNTER — Encounter: Payer: Medicaid Other | Attending: Physical Medicine & Rehabilitation

## 2018-03-29 ENCOUNTER — Ambulatory Visit: Payer: Medicaid Other | Admitting: Physical Medicine & Rehabilitation

## 2018-03-29 VITALS — BP 137/89 | HR 77 | Ht 67.0 in | Wt >= 6400 oz

## 2018-03-29 DIAGNOSIS — M25511 Pain in right shoulder: Secondary | ICD-10-CM | POA: Diagnosis not present

## 2018-03-29 DIAGNOSIS — J45909 Unspecified asthma, uncomplicated: Secondary | ICD-10-CM | POA: Insufficient documentation

## 2018-03-29 DIAGNOSIS — Z809 Family history of malignant neoplasm, unspecified: Secondary | ICD-10-CM | POA: Diagnosis not present

## 2018-03-29 DIAGNOSIS — Z955 Presence of coronary angioplasty implant and graft: Secondary | ICD-10-CM | POA: Insufficient documentation

## 2018-03-29 DIAGNOSIS — I2510 Atherosclerotic heart disease of native coronary artery without angina pectoris: Secondary | ICD-10-CM | POA: Diagnosis not present

## 2018-03-29 DIAGNOSIS — G8929 Other chronic pain: Secondary | ICD-10-CM

## 2018-03-29 DIAGNOSIS — M25562 Pain in left knee: Secondary | ICD-10-CM | POA: Insufficient documentation

## 2018-03-29 DIAGNOSIS — I10 Essential (primary) hypertension: Secondary | ICD-10-CM | POA: Insufficient documentation

## 2018-03-29 DIAGNOSIS — Z8249 Family history of ischemic heart disease and other diseases of the circulatory system: Secondary | ICD-10-CM | POA: Diagnosis not present

## 2018-03-29 DIAGNOSIS — Z823 Family history of stroke: Secondary | ICD-10-CM | POA: Diagnosis not present

## 2018-03-29 DIAGNOSIS — E119 Type 2 diabetes mellitus without complications: Secondary | ICD-10-CM | POA: Diagnosis not present

## 2018-03-29 DIAGNOSIS — E669 Obesity, unspecified: Secondary | ICD-10-CM | POA: Insufficient documentation

## 2018-03-29 DIAGNOSIS — M545 Low back pain: Secondary | ICD-10-CM | POA: Diagnosis not present

## 2018-03-29 DIAGNOSIS — M25561 Pain in right knee: Secondary | ICD-10-CM | POA: Insufficient documentation

## 2018-03-29 DIAGNOSIS — M5441 Lumbago with sciatica, right side: Secondary | ICD-10-CM | POA: Diagnosis not present

## 2018-03-29 DIAGNOSIS — M47816 Spondylosis without myelopathy or radiculopathy, lumbar region: Secondary | ICD-10-CM | POA: Diagnosis not present

## 2018-03-29 DIAGNOSIS — Z833 Family history of diabetes mellitus: Secondary | ICD-10-CM | POA: Insufficient documentation

## 2018-03-29 DIAGNOSIS — Z9889 Other specified postprocedural states: Secondary | ICD-10-CM | POA: Diagnosis not present

## 2018-03-29 DIAGNOSIS — E785 Hyperlipidemia, unspecified: Secondary | ICD-10-CM | POA: Diagnosis not present

## 2018-03-29 DIAGNOSIS — I214 Non-ST elevation (NSTEMI) myocardial infarction: Secondary | ICD-10-CM | POA: Diagnosis not present

## 2018-03-29 MED ORDER — TRAMADOL HCL 50 MG PO TABS: 50.0000 mg | ORAL_TABLET | Freq: Two times a day (BID) | ORAL | 0 refills | Status: DC

## 2018-03-29 NOTE — Patient Instructions (Addendum)
We will wait on furtherweight loss prior to rescheduling a Radiofrequency procdure

## 2018-03-29 NOTE — Progress Notes (Addendum)
Subjective:    Patient ID: Lindsay [name], female    DOB: [birth year], 37 y.o.   MRN: [id number] Relief of 50% on Right side of back on 3 occassions following bilateral L3-L4 MBB, L5 dorsal ramus injection  HPI  38 year old female with morbid obesity, BMI greater than 60, with chronic low back pain. Left L3-4-5 MBB helpful for ~3wks Unable to do Right L3-4 MB, R L5 DR RF due to body habitus Pt working on weight loss Takes tramadol 50 mg twice daily for pain.  She gets good relief with this Pain Inventory Average Pain 10 Pain Right Now 8 My pain is sharp and aching  In the last 24 hours, has pain interfered with the following? General activity 7 Relation with others 5 Enjoyment of life 10 What TIME of day is your pain at its worst? all Sleep (in general) Fair  Pain is worse with: walking, bending, inactivity, standing and some activites Pain improves with: rest, heat/ice, medication and injections Relief from Meds: 8  Mobility walk without assistance ability to climb steps?  yes do you drive?  no  Function not employed: date last employed . I need assistance with the following:  dressing, bathing, meal prep, household duties and shopping  Neuro/Psych numbness tingling anxiety  Prior Studies Any changes since last visit?  no  Physicians involved in your care Any changes since last visit?  no   Family History  Problem Relation Age of Onset  . Diabetes Mother   . Hypertension Mother   . Heart disease Mother   . Diabetes Father   . Heart disease Father   . Stroke Maternal Grandmother   . Cancer Maternal Grandmother    Social History   Socioeconomic History  . Marital status: Single    Spouse name: Not on file  . Number of children: Not on file  . Years of education: Not on file  . Highest education level: Not on file  Occupational History  . Not on file  Social Needs  . Financial resource strain: Not on file  . Food insecurity:    Worry: Not on  file    Inability: Not on file  . Transportation needs:    Medical: Not on file    Non-medical: Not on file  Tobacco Use  . Smoking status: Never Smoker  . Smokeless tobacco: Never Used  Substance and Sexual Activity  . Alcohol use: Yes    Comment: socially  . Drug use: No  . Sexual activity: Not on file  Lifestyle  . Physical activity:    Days per week: Not on file    Minutes per session: Not on file  . Stress: Not on file  Relationships  . Social connections:    Talks on phone: Not on file    Gets together: Not on file    Attends religious service: Not on file    Active member of club or organization: Not on file    Attends meetings of clubs or organizations: Not on file    Relationship status: Not on file  Other Topics Concern  . Not on file  Social History Narrative  . Not on file   Past Surgical History:  Procedure Laterality Date  . CARDIAC CATHETERIZATION N/A 09/07/2016   Procedure: Left Heart Cath and Coronary Angiography;  Surgeon: Lyn Records, MD;  Location: [facility] INVASIVE CV LAB;  Service: Cardiovascular;  Laterality: N/A;  . CARDIAC CATHETERIZATION N/A 09/07/2016   Procedure: Coronary Stent Intervention;  Surgeon: Lyn Records, MD;  Location: [facility] INVASIVE CV LAB;  Service: Cardiovascular;  Laterality: N/A;  . CARDIAC CATHETERIZATION N/A 09/07/2016   Procedure: Coronary Balloon Angioplasty;  Surgeon: Lyn Records, MD;  Location: [facility] INVASIVE CV LAB;  Service: Cardiovascular;  Laterality: N/A;  . CESAREAN SECTION    . IRRIGATION AND DEBRIDEMENT SHOULDER Right 04/30/2015   Procedure: IRRIGATION AND DEBRIDEMENT SHOULDER;  Surgeon: Sheral Apley, MD;  Location: [facility] OR;  Service: Orthopedics;  Laterality: Right;  . IRRIGATION AND DEBRIDEMENT SHOULDER Right 05/01/2015  . LEG SURGERY    . SHOULDER ARTHROSCOPY Right 04/30/2015   Procedure: ARTHROSCOPY SHOULDER;  Surgeon: Sheral Apley, MD;  Location: [facility] OR;  Service: Orthopedics;  Laterality: Right;  . TONSILLECTOMY       Past Medical History:  Diagnosis Date  . ARF (acute renal failure) (HCC) 04/2015  . Asthma   . Cellulitis of right upper extremity   . Coronary artery disease   . Diabetes mellitus    insulin dependent  . Hyperlipidemia LDL goal <70   . Hypertension   . NSTEMI (non-ST elevated myocardial infarction) (HCC) 08/2016  . Obesity   . S/P angioplasty with stent 08/2016   DES to mLAD and PTCA only to 2nd diag ostium.    BP 137/89   Pulse 77   Ht 5\' 7"  (1.702 m) Comment: states  Wt (!) 404 lb (183.3 kg)   SpO2 95%   BMI 63.28 kg/m   Opioid Risk Score:   Fall Risk Score:  `1  Depression screen PHQ 2/9  Depression screen [facility] 2/9 01/13/2018 11/08/2017 05/28/2017 04/27/2017 04/13/2017 02/24/2017 02/19/2017  Decreased Interest 0 0 0 0 0 0 0  Down, Depressed, Hopeless 0 0 0 0 0 0 0  PHQ - 2 Score 0 0 0 0 0 0 0     Review of Systems  Constitutional: Negative.   HENT: Negative.   Eyes: Negative.   Respiratory: Negative.   Cardiovascular: Negative.   Gastrointestinal: Negative.   Endocrine: Negative.   Genitourinary: Negative.   Musculoskeletal: Positive for arthralgias, back pain and myalgias.  Skin: Negative.   Allergic/Immunologic: Negative.   Neurological: Negative.   Hematological: Negative.   Psychiatric/Behavioral: Negative.   All other systems reviewed and are negative.      Objective:   Physical Exam  Constitutional: She is oriented to person, place, and time. She appears well-developed and well-nourished.  HENT:  Head: Normocephalic and atraumatic.  Eyes: Pupils are equal, round, and reactive to light. Conjunctivae and EOM are normal.  Musculoskeletal:  Lumbar range of motion 25-50% range with lumbar flexion extension lateral bending and rotation  Neurological: She is alert and oriented to person, place, and time. She has normal strength.  Psychiatric: She has a normal mood and affect. Thought content normal.  Nursing note and vitals reviewed.   Morbidly obese  female in no acute distress Mood and affect are appropriate Motor strength is 5/5 bilateral hip flexor knee extensor ankle dorsiflexor Negative straight leg raising Tenderness palpation bilateral lumbar paraspinals around L4 right greater than left side. Ambulates without assistive device no evidence of toe drag or knee instability.      Assessment & Plan:  1.  Lumbar spondylosis without myelopathy. Patient with good temporary relief following medial branch blocks L3-L4 as well as L5 dorsal ramus injections.  Cannot do lumbar radiofrequency secondary to lack of a probe and RF needle that can reach the target.  Longest available is 15 cm.  We discussed that a weight loss of 30-40 pounds should allow [place] to do the radiofrequency procedure.  Patient will continue with her weight loss efforts. PMR f/u in 64mo

## 2018-04-02 LAB — TOXASSURE SELECT 13 (MW), URINE

## 2018-04-04 ENCOUNTER — Telehealth: Payer: Self-pay | Admitting: *Deleted

## 2018-04-04 NOTE — Telephone Encounter (Addendum)
Urine drug screen for this encounter was positive for alcohol and THC.  ----- Message from Erick Colace, MD sent at 04/04/2018  6:35 AM EDT ----- Non narcotic, no tramadol rx, injection only

## 2018-04-04 NOTE — Telephone Encounter (Signed)
Will she need to follow up with Riley Lam in 6 mo if she is not getting tramadol? I have put flag in chart for non narcotic tx.

## 2018-04-05 NOTE — Telephone Encounter (Signed)
No 6 mo f/u needed

## 2018-04-06 NOTE — Telephone Encounter (Signed)
Letter mailed and will be injection only.

## 2018-04-12 ENCOUNTER — Telehealth: Payer: Self-pay | Admitting: Physical Medicine & Rehabilitation

## 2018-04-12 ENCOUNTER — Other Ambulatory Visit: Payer: Self-pay | Admitting: Endocrinology

## 2018-04-12 ENCOUNTER — Other Ambulatory Visit (INDEPENDENT_AMBULATORY_CARE_PROVIDER_SITE_OTHER): Payer: Medicaid Other

## 2018-04-12 DIAGNOSIS — Z794 Long term (current) use of insulin: Secondary | ICD-10-CM | POA: Diagnosis not present

## 2018-04-12 DIAGNOSIS — E1165 Type 2 diabetes mellitus with hyperglycemia: Secondary | ICD-10-CM

## 2018-04-12 LAB — BASIC METABOLIC PANEL
BUN: 14 mg/dL (ref 6–23)
CHLORIDE: 101 meq/L (ref 96–112)
CO2: 31 mEq/L (ref 19–32)
CREATININE: 0.79 mg/dL (ref 0.40–1.20)
Calcium: 9.1 mg/dL (ref 8.4–10.5)
GFR: 105.56 mL/min (ref >60.00)
Glucose, Bld: 160 mg/dL — ABNORMAL HIGH (ref 70–99)
Potassium: 4.1 mEq/L (ref 3.5–5.1)
Sodium: 138 mEq/L (ref 135–145)

## 2018-04-12 LAB — LIPID PANEL
CHOL/HDL RATIO: 3
Cholesterol: 136 mg/dL (ref 0–200)
HDL: 44.5 mg/dL (ref >39.00)
LDL CALC: 53 mg/dL (ref 0–99)
NONHDL: 91.39
Triglycerides: 193 mg/dL — ABNORMAL HIGH (ref 0.0–149.0)
VLDL: 38.6 mg/dL (ref 0.0–40.0)

## 2018-04-12 LAB — HEMOGLOBIN A1C: HEMOGLOBIN A1C: 9 % — AB (ref 4.6–6.5)

## 2018-04-12 NOTE — Telephone Encounter (Signed)
Patient called and does not want to wait until Oct for next visit - wants to know is there anything else that you can do for her between now and then.  Asked to have another MBB if possible. Please advise [phone] phone

## 2018-04-13 NOTE — Progress Notes (Signed)
Patient ID: Lindsay [name], female   DOB: 1981-02-03, 37 y.o.   MRN: [id number]            Reason for Appointment: Type II Diabetes, follow-up    History of Present Illness   Diagnosis date for diabetes: 2004  Previous history:  Was started on insulin about 2008 No prior records are available  Recent history:  Non-insulin hypoglycemic drugs: , Jardiance 10 mg daily  Insulin regimen: Lantus 60 bid, currently not on HUMULIN R U-500 with syringe; NovoLog before meals: 2U /15g   Her A1c is 9% and usually not controlled  She has not been seen in follow-up since 07/2017   Current management, blood sugar patterns and problems identified:  She has lost her glucose meter several weeks ago and did not ask for a replacement and has not checked her sugar in quite some time  She had her prescriptions refilled by her PCP and has been given NovoLog instead of the Humulin R that she was taking  Although she was told to take Byetta on her last visit she ran out and has not started back on this  Also she is taking double the amount of Lantus that she was taking in the past when she was taking Humulin R also  Her last fasting glucose was 160  She is somewhat unclear about how she is calculating her insulin doses with the NovoLog and she thinks she is basing it on carbohydrate intake but is still using the same factor as with the Humulin R U-500  She usually is eating fast food at lunch and will take about 6 units for this  She has not been motivated to exercise  Has gained a significant amount of weight this year         Side effects from medications: None        Proper timing of medications in relation to meals: Yes.          Monitors blood glucose: Once or twice a day.    Glucometer:  Accu-Chek  Blood Glucose readings not available         Meals: 3 meals per day. Drinks water, Avoiding drinks with sugar Variable breakfast, sometimes boiled eggs and toast, lunch 1-2 p.m.,  usually fast food supper variable          Physical activity: exercise: not walking             Dietician visit: Most recent: 2/18     Weight control:  Wt Readings from Last 3 Encounters:  04/14/18 (!) 422 lb (191.4 kg)  03/29/18 (!) 404 lb (183.3 kg)  01/13/18 (!) 386 lb 6.4 oz (175.3 kg)           Diabetes labs:  Lab Results  Component Value Date   HGBA1C 9.0 (H) 04/12/2018   HGBA1C 11.7 01/13/2018   HGBA1C 8.2 (H) 07/30/2017   Lab Results  Component Value Date   MICROALBUR 9.6 (H) 02/18/2017   LDLCALC 53 04/12/2018   CREATININE 0.79 04/12/2018     Allergies as of 04/14/2018      Reactions   Hydrazine Yellow [tartrazine] Shortness Of Breath, Swelling   Swelling mostly noticed in legs and feet, retaining urination, shortness of breaht, and minor chest pain   Lisinopril Shortness Of Breath   Was on prinzide; had sob/chest pain on it.   Tylenol [acetaminophen] Itching, Swelling   Itching of the mouth, swelling of tongue and stomach started hurting  Medication List        Accurate as of 04/14/18 12:48 PM. Always use your most recent med list.          ACCU-CHEK AVIVA CONNECT w/Device Kit 1 each by Does not apply route daily.   albuterol 108 (90 Base) MCG/ACT inhaler Commonly known as:  PROVENTIL HFA INHALE 2 PUFFS BY MOUTH EVERY 6 HOURS AS NEEDED FOR WHEEZING OR SHORTNESS OF BREATH   amLODipine 5 MG tablet Commonly known as:  NORVASC Take 1 tablet (5 mg total) by mouth daily.   aspirin 81 MG tablet Take 1 tablet (81 mg total) by mouth daily.   aspirin-acetaminophen-caffeine 250-250-65 MG tablet Commonly known as:  EXCEDRIN MIGRAINE Take 2 tablets by mouth every 6 (six) hours as needed for headache.   atorvastatin 80 MG tablet Commonly known as:  LIPITOR Take 1 tablet (80 mg total) by mouth daily at 6 PM. Please call and schedule November/December follow up. [phone]   b complex vitamins tablet Take 1 tablet by mouth daily.   diclofenac  sodium 1 % Gel Commonly known as:  VOLTAREN Apply 2 g topically 4 (four) times daily.   diphenhydrAMINE 25 MG tablet Commonly known as:  BENADRYL Take 1 tablet (25 mg total) by mouth every 6 (six) hours as needed.   DULoxetine 30 MG capsule Commonly known as:  CYMBALTA Take 1 capsule (30 mg total) by mouth daily.   empagliflozin 10 MG Tabs tablet Commonly known as:  JARDIANCE Take 10 mg by mouth daily with breakfast.   gabapentin 300 MG capsule Commonly known as:  NEURONTIN Take 2 capsules (600 mg total) by mouth 3 (three) times daily.   GARLIC PO Take 1 tablet by mouth 2 (two) times daily.   glucose blood test strip Commonly known as:  ACCU-CHEK AVIVA PLUS Use as instructed to test blood sugar 4 times daily.   hydrochlorothiazide 25 MG tablet Commonly known as:  HYDRODIURIL Take 1 tablet (25 mg total) by mouth daily. Take on tablet in the morning.   ibuprofen 200 MG tablet Commonly known as:  ADVIL,MOTRIN Take 400 mg by mouth every 6 (six) hours as needed for fever or moderate pain.   insulin aspart 100 UNIT/ML injection Commonly known as:  NOVOLOG Use per sliding scale: If sugar 150-200 take 4 units If sugar 201-251 take 6 units If sugar 251-300 take 8 units If sugar 301-350 take 10 units If sugar 351-400 take 12 units   Insulin Glargine 100 UNIT/ML Solostar Pen Commonly known as:  LANTUS SOLOSTAR Inject 60 units at bedtime.   insulin regular human CONCENTRATED 500 UNIT/ML injection Commonly known as:  HUMULIN R Inject 10-25 Units into the skin 3 (three) times daily with meals.   losartan 25 MG tablet Commonly known as:  COZAAR Take 1 tablet (25 mg total) by mouth daily.   Magnesium 250 MG Tabs Take 1 tablet by mouth 2 (two) times daily.   Melatonin 10 MG Tabs Take 3 tablets by mouth at bedtime.   meloxicam 15 MG tablet Commonly known as:  MOBIC TAKE ONE TABLET BY MOUTH ONCE DAILY .MUST  HAVE  OFFICE  VISIT  FOR  REFILLS   methocarbamol 750 MG  tablet Commonly known as:  ROBAXIN Take 1 tablet (750 mg total) by mouth at bedtime.   metoprolol tartrate 50 MG tablet Commonly known as:  LOPRESSOR Take 1 tablet (50 mg total) by mouth 2 (two) times daily.   MULTIVITAMIN PO Take 1 tablet by mouth daily.  nitroGLYCERIN 0.4 MG SL tablet Commonly known as:  NITROSTAT Place 1 tablet (0.4 mg total) under the tongue every 5 (five) minutes as needed for chest pain.   sodium chloride 0.65 % Soln nasal spray Commonly known as:  OCEAN Place 1 spray into both nostrils as needed for congestion.   traMADol 50 MG tablet Commonly known as:  ULTRAM Take 1 tablet (50 mg total) by mouth 2 (two) times daily.   VICKS VAPORUB EX Apply 1 application topically as needed (Congestion).       Allergies:  Allergies  Allergen Reactions  . Hydrazine Yellow [Tartrazine] Shortness Of Breath and Swelling    Swelling mostly noticed in legs and feet, retaining urination, shortness of breaht, and minor chest pain  . Lisinopril Shortness Of Breath    Was on prinzide; had sob/chest pain on it.  . Tylenol [Acetaminophen] Itching and Swelling    Itching of the mouth, swelling of tongue and stomach started hurting    Past Medical History:  Diagnosis Date  . ARF (acute renal failure) ([place]) 04/2015  . Asthma   . Cellulitis of right upper extremity   . Coronary artery disease   . Diabetes mellitus    insulin dependent  . Hyperlipidemia LDL goal <70   . Hypertension   . NSTEMI (non-ST elevated myocardial infarction) ([place]) 08/2016  . Obesity   . S/P angioplasty with stent 08/2016   DES to mLAD and PTCA only to 2nd diag ostium.     Past Surgical History:  Procedure Laterality Date  . CARDIAC CATHETERIZATION N/A 09/07/2016   Procedure: Left Heart Cath and Coronary Angiography;  Surgeon: [name], MD;  Location: [facility];  Service: Cardiovascular;  Laterality: N/A;  . CARDIAC CATHETERIZATION N/A 09/07/2016   Procedure: Coronary Stent  Intervention;  Surgeon: [name], MD;  Location: [facility];  Service: Cardiovascular;  Laterality: N/A;  . CARDIAC CATHETERIZATION N/A 09/07/2016   Procedure: Coronary Balloon Angioplasty;  Surgeon: [name], MD;  Location: [facility];  Service: Cardiovascular;  Laterality: N/A;  . CESAREAN SECTION    . IRRIGATION AND DEBRIDEMENT SHOULDER Right 04/30/2015   Procedure: IRRIGATION AND DEBRIDEMENT SHOULDER;  Surgeon: Renette Butters, MD;  Location: [facility];  Service: Orthopedics;  Laterality: Right;  . IRRIGATION AND DEBRIDEMENT SHOULDER Right 05/01/2015  . LEG SURGERY    . SHOULDER ARTHROSCOPY Right 04/30/2015   Procedure: ARTHROSCOPY SHOULDER;  Surgeon: Renette Butters, MD;  Location: [facility];  Service: Orthopedics;  Laterality: Right;  . TONSILLECTOMY      Family History  Problem Relation Age of Onset  . Diabetes Mother   . Hypertension Mother   . Heart disease Mother   . Diabetes Father   . Heart disease Father   . Stroke Maternal Grandmother   . Cancer Maternal Grandmother     Social History:  reports that she has never smoked. She has never used smokeless tobacco. She reports that she drinks alcohol. She reports that she does not use drugs.  Review of Systems:  Hypertension:  She is on  Lopressor, HCTZ, losartan and amlodipine, followed by cardiologist    BP Readings from Last 3 Encounters:  04/14/18 138/84  03/29/18 137/89  03/01/18 (!) 146/94     Lipids: Has been treated with Lipitor 80 mg because of history of CAD  Lab Results  Component Value Date   CHOL 136 04/12/2018   HDL 44.50 04/12/2018   LDLCALC 53 04/12/2018  TRIG 193.0 (H) 04/12/2018   CHOLHDL 3 04/12/2018      NEUROPATHY: She has tingling in her legs and hands    LABS:  Lab on 04/12/2018  Component Date Value Ref Range Status  . Cholesterol 04/12/2018 136  0 - 200 mg/dL Final   ATP III Classification       Desirable:  < 200 mg/dL               Borderline High:  200 -  239 mg/dL          High:  > = 240 mg/dL  . Triglycerides 04/12/2018 193.0* 0.0 - 149.0 mg/dL Final   Normal:  <150 mg/dLBorderline High:  150 - 199 mg/dL  . HDL 04/12/2018 44.50  >39.00 mg/dL Final  . VLDL 04/12/2018 38.6  0.0 - 40.0 mg/dL Final  . LDL Cholesterol 04/12/2018 53  0 - 99 mg/dL Final  . Total CHOL/HDL Ratio 04/12/2018 3   Final                  Men          Women1/2 Average Risk     3.4          3.3Average Risk          5.0          4.42X Average Risk          9.6          7.13X Average Risk          15.0          11.0                      . NonHDL 04/12/2018 91.39   Final   NOTE:  Non-HDL goal should be 30 mg/dL higher than patient's LDL goal (i.e. LDL goal of < 70 mg/dL, would have non-HDL goal of < 100 mg/dL)  . Sodium 04/12/2018 138  135 - 145 mEq/L Final  . Potassium 04/12/2018 4.1  3.5 - 5.1 mEq/L Final  . Chloride 04/12/2018 101  96 - 112 mEq/L Final  . CO2 04/12/2018 31  19 - 32 mEq/L Final  . Glucose, Bld 04/12/2018 160* 70 - 99 mg/dL Final  . BUN 04/12/2018 14  6 - 23 mg/dL Final  . Creatinine, Ser 04/12/2018 0.79  0.40 - 1.20 mg/dL Final  . Calcium 04/12/2018 9.1  8.4 - 10.5 mg/dL Final  . GFR 04/12/2018 105.56  >60.00 mL/min Final  . Hgb A1c MFr Bld 04/12/2018 9.0* 4.6 - 6.5 % Final   Glycemic Control Guidelines for People with Diabetes:Non Diabetic:  <6%Goal of Therapy: <7%Additional Action Suggested:  >8%      Examination:   BP 138/84 (BP Location: Left Arm, Patient Position: Sitting, Cuff Size: Normal)   Pulse 93   Ht 5' 7"  (1.702 m)   Wt (!) 422 lb (191.4 kg)   SpO2 96%   BMI 66.09 kg/m   Body mass index is 66.09 kg/m.     ASSESSMENT/ PLAN:    Diabetes type 2 with morbid obesity:   See history of present illness for detailed discussion of current diabetes management, blood sugar patterns and problems identified  Her A1c is 9%  She has been currently noncompliant with taking the insulin prescribed Has gained a significant amount of weight  despite continued hyperglycemia Previously may have benefited from taking Byetta but has not taken this in a while Discussed that with  NovoLog insulin she is getting only 1/5 her mealtime insulin requirement and is likely getting significant hyperglycemia after meals  More recently her fasting glucose was 160 and this is been taking twice as much Lantus as before Diet is poor with eating fast food regularly  OBESITY: She is going to be working with the bariatric program next month  RECOMMENDATIONS:  She needs to cut back on high fat foods  Start taking Humulin R insulin, starting with 2 units per 15 g but if postprandial readings are still high she will need to use 3 units per 15 g  Reduce evening Lantus to 50 units to avoid overnight hypoglycemia  Given blood sugar monitor and test strips were given and she will need to check her sugars 3-5 times a day  Follow-up and consider Byetta again on the next visit   Patient Instructions  Take Humulin R insulin for each meal and no Nolvolg  Take only 50 Lantus in pm with this  Start walking     Elayne Snare 04/14/2018, 12:48 PM

## 2018-04-14 ENCOUNTER — Ambulatory Visit (INDEPENDENT_AMBULATORY_CARE_PROVIDER_SITE_OTHER): Payer: Medicaid Other | Admitting: Endocrinology

## 2018-04-14 ENCOUNTER — Encounter: Payer: Self-pay | Admitting: Endocrinology

## 2018-04-14 ENCOUNTER — Other Ambulatory Visit: Payer: Self-pay

## 2018-04-14 VITALS — BP 138/84 | HR 93 | Ht 67.0 in | Wt >= 6400 oz

## 2018-04-14 DIAGNOSIS — Z794 Long term (current) use of insulin: Secondary | ICD-10-CM | POA: Diagnosis not present

## 2018-04-14 DIAGNOSIS — E1165 Type 2 diabetes mellitus with hyperglycemia: Secondary | ICD-10-CM | POA: Diagnosis not present

## 2018-04-14 MED ORDER — GLUCOSE BLOOD VI STRP: ORAL_STRIP | 12 refills | Status: DC

## 2018-04-14 MED ORDER — ACCU-CHEK AVIVA CONNECT W/DEVICE KIT: 1.0000 | PACK | Freq: Every day | 0 refills | Status: DC

## 2018-04-14 NOTE — Patient Instructions (Addendum)
Take Humulin R insulin for each meal and no Nolvolg  Take only 50 Lantus in pm with this  Start walking

## 2018-04-15 ENCOUNTER — Telehealth: Payer: Self-pay | Admitting: *Deleted

## 2018-04-15 MED ORDER — ACCU-CHEK AVIVA PLUS W/DEVICE KIT: 1.0000 | PACK | 0 refills | Status: DC

## 2018-04-15 NOTE — Telephone Encounter (Signed)
This needs to be Accu-Chek Aviva plus

## 2018-04-15 NOTE — Telephone Encounter (Signed)
Received a fax stating the pharmacy cannot order the Accu chek Aviva Connect meter/kit. They are requesting a new Rx for a different meter. Please advise.

## 2018-04-15 NOTE — Telephone Encounter (Signed)
New Rx sent. See meds.  

## 2018-04-17 NOTE — Telephone Encounter (Signed)
Nothing else I can do until she loses 30-40lb

## 2018-04-20 ENCOUNTER — Emergency Department (HOSPITAL_COMMUNITY)
Admission: EM | Admit: 2018-04-20 | Discharge: 2018-04-20 | Disposition: A | Discharge status: Home / Self Care | Payer: Medicaid Other | Attending: Emergency Medicine | Admitting: Emergency Medicine

## 2018-04-20 ENCOUNTER — Emergency Department (HOSPITAL_COMMUNITY): Payer: Medicaid Other

## 2018-04-20 ENCOUNTER — Encounter (HOSPITAL_COMMUNITY): Payer: Self-pay | Admitting: Emergency Medicine

## 2018-04-20 DIAGNOSIS — Z7982 Long term (current) use of aspirin: Secondary | ICD-10-CM | POA: Insufficient documentation

## 2018-04-20 DIAGNOSIS — M545 Low back pain: Secondary | ICD-10-CM | POA: Diagnosis not present

## 2018-04-20 DIAGNOSIS — Z955 Presence of coronary angioplasty implant and graft: Secondary | ICD-10-CM | POA: Insufficient documentation

## 2018-04-20 DIAGNOSIS — I10 Essential (primary) hypertension: Secondary | ICD-10-CM | POA: Insufficient documentation

## 2018-04-20 DIAGNOSIS — R609 Edema, unspecified: Secondary | ICD-10-CM | POA: Insufficient documentation

## 2018-04-20 DIAGNOSIS — J45909 Unspecified asthma, uncomplicated: Secondary | ICD-10-CM | POA: Insufficient documentation

## 2018-04-20 DIAGNOSIS — R600 Localized edema: Secondary | ICD-10-CM | POA: Diagnosis not present

## 2018-04-20 DIAGNOSIS — Z794 Long term (current) use of insulin: Secondary | ICD-10-CM | POA: Insufficient documentation

## 2018-04-20 DIAGNOSIS — E1140 Type 2 diabetes mellitus with diabetic neuropathy, unspecified: Secondary | ICD-10-CM | POA: Insufficient documentation

## 2018-04-20 DIAGNOSIS — I2510 Atherosclerotic heart disease of native coronary artery without angina pectoris: Secondary | ICD-10-CM | POA: Diagnosis not present

## 2018-04-20 DIAGNOSIS — Z79899 Other long term (current) drug therapy: Secondary | ICD-10-CM | POA: Diagnosis not present

## 2018-04-20 DIAGNOSIS — R0602 Shortness of breath: Secondary | ICD-10-CM | POA: Diagnosis not present

## 2018-04-20 LAB — I-STAT BETA HCG BLOOD, ED (MC, WL, AP ONLY): I-stat hCG, quantitative: <5 m[IU]/mL (ref <5)

## 2018-04-20 LAB — CBC
HCT: 37.7 % (ref 36.0–46.0)
Hemoglobin: 11.7 g/dL — ABNORMAL LOW (ref 12.0–15.0)
MCH: 26.7 pg (ref 26.0–34.0)
MCHC: 31 g/dL (ref 30.0–36.0)
MCV: 85.9 fL (ref 78.0–100.0)
PLATELETS: 244 10*3/uL (ref 150–400)
RBC: 4.39 MIL/uL (ref 3.87–5.11)
RDW: 15 % (ref 11.5–15.5)
WBC: 5.7 10*3/uL (ref 4.0–10.5)

## 2018-04-20 LAB — BASIC METABOLIC PANEL
Anion gap: 10 (ref 5–15)
BUN: 10 mg/dL (ref 6–20)
CHLORIDE: 105 mmol/L (ref 101–111)
CO2: 26 mmol/L (ref 22–32)
CREATININE: 0.88 mg/dL (ref 0.44–1.00)
Calcium: 8.9 mg/dL (ref 8.9–10.3)
GFR calc Af Amer: >60 mL/min (ref >60)
GFR calc non Af Amer: >60 mL/min (ref >60)
Glucose, Bld: 182 mg/dL — ABNORMAL HIGH (ref 65–99)
Potassium: 4.1 mmol/L (ref 3.5–5.1)
Sodium: 141 mmol/L (ref 135–145)

## 2018-04-20 LAB — I-STAT TROPONIN, ED: Troponin i, poc: 0 ng/mL (ref 0.00–0.08)

## 2018-04-20 LAB — BRAIN NATRIURETIC PEPTIDE: B Natriuretic Peptide: 21.5 pg/mL (ref 0.0–100.0)

## 2018-04-20 MED ORDER — FUROSEMIDE 20 MG PO TABS: 20.0000 mg | ORAL_TABLET | Freq: Every day | ORAL | 0 refills | Status: DC

## 2018-04-20 NOTE — ED Notes (Signed)
Pt brought back in wheelchair. Placed in room and given gown to change into.

## 2018-04-20 NOTE — ED Triage Notes (Addendum)
Pt states saw PCP today, she was hypertensive, has been struggling with right sided flank pain, 30 lb weight gain in less than a month, also unable to urinate as much as she normally does. PCP sent her here for a work up for fluid retention. Endorses shortness of breath at rest throughout the past month. Denies chest pain.

## 2018-04-20 NOTE — ED Notes (Signed)
Pt given bariatric gown and wheeled to bathroom.

## 2018-04-20 NOTE — ED Notes (Signed)
Pulse ox while ambulating 98%. HR=104 while ambulating.

## 2018-04-20 NOTE — ED Provider Notes (Signed)
[facility]PART[facility] Provider Note  CSN: [id number] Arrival date & time: 04/20/18 1143  Chief Complaint(s) Congestive Heart Failure  HPI Lindsay [name] is a 37 y.o. female with an extensive past medical history listed below including hypertension, hyperlipidemia, diabetes, prior MI status post stenting, heart failure secondary to the MI with last echocardiogram September 2017 that revealed good heart function with an EF of 55 to 60% without evidence of diastolic dysfunction.  She presents to the emergency department for several weeks of progressivel worseningy peripheral edema with a reported 30 pound weight gain and that timeframe.  Patient is also endorsing dyspnea on exertion with long distance ambulation.  She denies any associated chest pain.  No shortness of breath at rest.  No headache or visual disturbances.  No nausea, vomiting, abdominal pain, diarrhea.  No urinary symptoms.  Patient does report that she has had decreased urine output over the last week.  She does report that she has cut back on her oral hydration since she noted the increased swelling.  She is currently on HCTZ and is states that she has been compliant with that medication.  Patient reports decreased ambulation due to chronic lower back pain that is currently being managed by chronic pain specialist.  She denies any acute injuries or trauma.  She denies any lower extremity weakness or loss of sensation.  No bladder/bowel incontinence.  HPI  Past Medical History Past Medical History:  Diagnosis Date  . ARF (acute renal failure) ([place]) 04/2015  . Asthma   . Cellulitis of right upper extremity   . Coronary artery disease   . Diabetes mellitus    insulin dependent  . Hyperlipidemia LDL goal <70   . Hypertension   . NSTEMI (non-ST elevated myocardial infarction) ([place]) 08/2016  . Obesity   . S/P angioplasty with stent 08/2016   DES to mLAD and PTCA only to 2nd diag ostium.    Patient  Active Problem List   Diagnosis Date Noted  . OSA (obstructive sleep apnea) 11/03/2016  . Coronary artery disease involving native coronary artery of native heart with angina pectoris ([place]) 09/11/2016  . Diabetes mellitus with complication ([place])   . Community acquired pneumonia 09/07/2016  . MI, old   . DM neuropathy, type II diabetes mellitus ([place]) 06/10/2016  . Morbid obesity ([place]) 06/10/2016  . Pressure ulcer 05/17/2015  . 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 ARF (acute renal failure) ([place])   . Septic arthritis ([place])   . Essential hypertension 04/30/2015  . Cellulitis 04/30/2015  . Abscess of right shoulder    Home Medication(s) Prior to Admission medications   Medication Sig Start Date End Date Taking? Authorizing Provider  albuterol (PROVENTIL HFA) 108 (90 Base) MCG/ACT inhaler INHALE 2 PUFFS BY MOUTH EVERY 6 HOURS AS NEEDED FOR WHEEZING OR SHORTNESS OF BREATH 01/13/18   Clent Demark, PA-C  amLODipine (NORVASC) 5 MG tablet Take 1 tablet (5 mg total) by mouth daily. 01/13/18   Clent Demark, PA-C  aspirin 81 MG tablet Take 1 tablet (81 mg total) by mouth daily. 01/13/18   Clent Demark, PA-C  aspirin-acetaminophen-caffeine (EXCEDRIN MIGRAINE) [phone] MG tablet Take 2 tablets by mouth every 6 (six) hours as needed for headache.    [provider]  atorvastatin (LIPITOR) 80 MG tablet Take 1 tablet (80 mg total) by mouth daily at 6 PM. Please call and schedule November/December follow up. [phone] 01/13/18   Clent Demark, PA-C  b complex vitamins  tablet Take 1 tablet by mouth daily.    [provider]  Blood Glucose Monitoring Suppl (ACCU-CHEK AVIVA PLUS) w/Device KIT 1 each by Does not apply route as directed. 04/15/18   Elayne Snare, MD  Camphor-Eucalyptus-Menthol [facility] VAPORUB EX) Apply 1 application topically as needed (Congestion).    [provider]  diclofenac sodium (VOLTAREN) 1 % GEL Apply 2 g topically 4 (four) times daily. Patient  taking differently: Apply 2 g topically 4 (four) times daily as needed (Pain).  10/14/17   Kirsteins, Luanna Salk, MD  diphenhydrAMINE (BENADRYL) 25 MG tablet Take 1 tablet (25 mg total) by mouth every 6 (six) hours as needed. 04/13/17   Clent Demark, PA-C  DULoxetine (CYMBALTA) 30 MG capsule Take 1 capsule (30 mg total) by mouth daily. 01/13/18   Clent Demark, PA-C  empagliflozin (JARDIANCE) 10 MG TABS tablet Take 10 mg by mouth daily with breakfast. 01/13/18   Clent Demark, PA-C  furosemide (LASIX) 20 MG tablet Take 1 tablet (20 mg total) by mouth daily for 7 days. 04/20/18 04/27/18  Fatima Blank, MD  gabapentin (NEURONTIN) 300 MG capsule Take 2 capsules (600 mg total) by mouth 3 (three) times daily. 01/13/18   Clent Demark, PA-C  GARLIC PO Take 1 tablet by mouth 2 (two) times daily.    [provider]  glucose blood (ACCU-CHEK AVIVA PLUS) test strip Use as instructed to test blood sugar 4 times daily. 04/14/18   Elayne Snare, MD  ibuprofen (ADVIL,MOTRIN) 200 MG tablet Take 400 mg by mouth every 6 (six) hours as needed for fever or moderate pain.    [provider]  insulin aspart (NOVOLOG) 100 UNIT/ML injection Use per sliding scale: If sugar 150-200 take 4 units If sugar 201-251 take 6 units If sugar 251-300 take 8 units If sugar 301-350 take 10 units If sugar 351-400 take 12 units 01/13/18   Clent Demark, PA-C  Insulin Glargine (LANTUS SOLOSTAR) 100 UNIT/ML Solostar Pen Inject 60 units at bedtime. 01/13/18   Clent Demark, PA-C  insulin regular human CONCENTRATED (HUMULIN R) 500 UNIT/ML injection Inject 10-25 Units into the skin 3 (three) times daily with meals.     [provider]  losartan (COZAAR) 25 MG tablet Take 1 tablet (25 mg total) by mouth daily. 01/13/18   Clent Demark, PA-C  Magnesium 250 MG TABS Take 1 tablet by mouth 2 (two) times daily.    [provider]  Melatonin 10 MG TABS Take 3 tablets by mouth at  bedtime. 01/13/18   Clent Demark, PA-C  meloxicam (MOBIC) 15 MG tablet TAKE ONE TABLET BY MOUTH ONCE DAILY .MUST  HAVE  OFFICE  VISIT  FOR  REFILLS 01/13/18   Clent Demark, PA-C  methocarbamol (ROBAXIN) 750 MG tablet Take 1 tablet (750 mg total) by mouth at bedtime. 01/13/18   Clent Demark, PA-C  metoprolol tartrate (LOPRESSOR) 50 MG tablet Take 1 tablet (50 mg total) by mouth 2 (two) times daily. 01/13/18   Clent Demark, PA-C  Multiple Vitamins-Minerals (MULTIVITAMIN PO) Take 1 tablet by mouth daily.    [provider]  nitroGLYCERIN (NITROSTAT) 0.4 MG SL tablet Place 1 tablet (0.4 mg total) under the tongue every 5 (five) minutes as needed for chest pain. 01/13/18   Clent Demark, PA-C  sodium chloride (OCEAN) 0.65 % SOLN nasal spray Place 1 spray into both nostrils as needed for congestion. 02/19/17   Clent Demark, PA-C  traMADol (ULTRAM) 50 MG tablet Take 1 tablet (50 mg total) by mouth 2 (two) times daily. 03/29/18   Kirsteins, Luanna Salk, MD                                                                                                                                    Past Surgical History Past Surgical History:  Procedure Laterality Date  . CARDIAC CATHETERIZATION N/A 09/07/2016   Procedure: Left Heart Cath and Coronary Angiography;  Surgeon: [name], MD;  Location: [facility];  Service: Cardiovascular;  Laterality: N/A;  . CARDIAC CATHETERIZATION N/A 09/07/2016   Procedure: Coronary Stent Intervention;  Surgeon: [name], MD;  Location: [facility];  Service: Cardiovascular;  Laterality: N/A;  . CARDIAC CATHETERIZATION N/A 09/07/2016   Procedure: Coronary Balloon Angioplasty;  Surgeon: [name], MD;  Location: [facility];  Service: Cardiovascular;  Laterality: N/A;  . CESAREAN SECTION    . IRRIGATION AND DEBRIDEMENT SHOULDER Right 04/30/2015   Procedure: IRRIGATION AND DEBRIDEMENT SHOULDER;  Surgeon: Renette Butters,  MD;  Location: [facility];  Service: Orthopedics;  Laterality: Right;  . IRRIGATION AND DEBRIDEMENT SHOULDER Right 05/01/2015  . LEG SURGERY    . SHOULDER ARTHROSCOPY Right 04/30/2015   Procedure: ARTHROSCOPY SHOULDER;  Surgeon: Renette Butters, MD;  Location: [facility];  Service: Orthopedics;  Laterality: Right;  . TONSILLECTOMY     Family History Family History  Problem Relation Age of Onset  . Diabetes Mother   . Hypertension Mother   . Heart disease Mother   . Diabetes Father   . Heart disease Father   . Stroke Maternal Grandmother   . Cancer Maternal Grandmother     Social History Social History   Tobacco Use  . Smoking status: Never Smoker  . Smokeless tobacco: Never Used  Substance Use Topics  . Alcohol use: Yes    Comment: socially  . Drug use: No   Allergies Hydrazine yellow [tartrazine]; Lisinopril; and Tylenol [acetaminophen]  Review of Systems Review of Systems All other systems are reviewed and are negative for acute change except as noted in the HPI  Physical Exam Vital Signs  I have reviewed the triage vital signs BP 133/72   Pulse 82   Temp 99 F (37.2 C) (Oral)   Resp 18   LMP 03/21/2018   SpO2 96%   Physical Exam  Constitutional: She is oriented to person, place, and time. She appears well-developed and well-nourished. No distress.  Morbidly obese  HENT:  Head: Normocephalic and atraumatic.  Nose: Nose normal.  Eyes: Pupils are equal, round, and reactive to light. Conjunctivae and EOM are normal. Right eye exhibits no discharge. Left eye exhibits no discharge. No scleral icterus.  Neck: Normal range of motion. Neck supple.  Cardiovascular: Normal rate and regular rhythm. Exam reveals no gallop and no friction rub.  No murmur heard. Pulmonary/Chest: Effort  normal and breath sounds normal. No stridor. No respiratory distress. She has no rales.  Abdominal: Soft. She exhibits no distension. There is no tenderness.  Musculoskeletal: She exhibits no  edema.       Lumbar back: She exhibits tenderness. She exhibits no bony tenderness.       Back:  3+ pitting edema bilateral lower extremity up to the thighs.  Neurological: She is alert and oriented to person, place, and time.  Spine Exam: Strength: 5/5 throughout LE bilaterally  Sensation: Intact to light touch in proximal and distal LE bilaterally Reflexes: 1+ quadriceps and achilles reflexes   Skin: Skin is warm and dry. No rash noted. She is not diaphoretic. No erythema.  Psychiatric: She has a normal mood and affect.  Vitals reviewed.   ED Results and Treatments Labs (all labs ordered are listed, but only abnormal results are displayed) Labs Reviewed  BASIC METABOLIC PANEL - Abnormal; Notable for the following components:      Result Value   Glucose, Bld 182 (*)    All other components within normal limits  CBC - Abnormal; Notable for the following components:   Hemoglobin 11.7 (*)    All other components within normal limits  BRAIN NATRIURETIC PEPTIDE  I-STAT TROPONIN, ED  I-STAT BETA HCG BLOOD, ED ([facility], [facility], [facility] ONLY)                                                                                                                         EKG  EKG Interpretation  Date/Time:  Wednesday Apr 20 2018 11:55:56 EDT Ventricular Rate:  89 PR Interval:  152 QRS Duration: 86 QT Interval:  392 QTC Calculation: 476 R Axis:   41 Text Interpretation:  Normal sinus rhythm Normal ECG No significant change since last tracing Confirmed by Addison Lank [phone]) on 04/20/2018 1:34:32 PM      Radiology Dg Chest 2 View  Result Date: 04/20/2018 CLINICAL DATA:  30 pound weight gain over the past month, right flank pain, shortness of breath with no chest pain. History of morbid obesity, asthma, coronary artery disease and NSTEMI, diabetes, and acute renal failure. EXAM: CHEST - 2 VIEW COMPARISON:  Chest x-ray and chest CT scan of September 06, 2016 FINDINGS: The lungs are well-expanded. There  is no focal infiltrate. There is no pleural effusion. The heart is normal in size. There is mild central pulmonary vascular congestion. The trachea is midline. The bony thorax exhibits no acute abnormality. IMPRESSION: Top-normal cardiac size with mild central pulmonary vascular prominence. No pulmonary edema or pleural effusion. Electronically Signed   By: David  Martinique M.D.   On: 04/20/2018 12:26   Pertinent labs & imaging results that were available during my care of the patient were reviewed by me and considered in my medical decision making (see chart for details).  Medications Ordered in ED Medications - No data to display  Procedures Procedures  (including critical care time)  Medical Decision Making / ED Course I have reviewed the nursing notes for this encounter and the patient's prior records (if available in EHR or on provided paperwork).    Patient is here for peripheral edema and 30 pound weight gain.  History of heart failure with an echocardiogram 2017 that was reassuring.  Patient is well-appearing, well-hydrated, nontoxic and in no respiratory distress.  She does have evidence of volume overload on exam.  Her work-up is reassuring without evidence of pulmonary edema.  BNP within normal limits.  Renal function intact.  No electrolyte derangements.  Patient was ambulated on pulse ox and showed no evidence of desaturation.  Feel the patient is stable for outpatient management and continued work-up.  We will start the patient on oral Lasix and have her follow-up closely with her primary care provider within 1 week for a chemistry panel recheck and reevaluation.  The patient appears reasonably screened and/or stabilized for discharge and I doubt any other medical condition or other [facility] requiring further screening, evaluation, or treatment in the ED at this  time prior to discharge. The patient is safe for discharge with strict return precautions.     Final Clinical Impression(s) / ED Diagnoses Final diagnoses:  Peripheral edema   Disposition: Discharge  Condition: Good  I have discussed the results, Dx and Tx plan with the patient who expressed understanding and agree(s) with the plan. Discharge instructions discussed at great length. The patient was given strict return precautions who verbalized understanding of the instructions. No further questions at time of discharge.    ED Discharge Orders        Ordered    furosemide (LASIX) 20 MG tablet  Daily     04/20/18 1535       Follow Up: Clent Demark, PA-C [place] [phone]   in 5-7 days for close follow up for fluid retension and have chemistry panel rechecked      This chart was dictated using voice recognition software.  Despite best efforts to proofread,  errors can occur which can change the documentation meaning.   Fatima Blank, MD 04/20/18 [phone]

## 2018-04-20 NOTE — ED Notes (Signed)
Pt ambulated back to room

## 2018-05-03 ENCOUNTER — Encounter: Payer: Medicaid Other | Admitting: Nutrition

## 2018-05-05 ENCOUNTER — Ambulatory Visit (INDEPENDENT_AMBULATORY_CARE_PROVIDER_SITE_OTHER): Payer: Medicaid Other

## 2018-05-05 ENCOUNTER — Ambulatory Visit: Payer: Medicaid Other | Admitting: Podiatry

## 2018-05-05 ENCOUNTER — Encounter: Payer: Self-pay | Admitting: Podiatry

## 2018-05-05 VITALS — BP 152/84 | HR 94 | Resp 16

## 2018-05-05 DIAGNOSIS — M216X1 Other acquired deformities of right foot: Secondary | ICD-10-CM

## 2018-05-05 DIAGNOSIS — M7751 Other enthesopathy of right foot: Secondary | ICD-10-CM

## 2018-05-05 DIAGNOSIS — M778 Other enthesopathies, not elsewhere classified: Secondary | ICD-10-CM

## 2018-05-05 DIAGNOSIS — E1149 Type 2 diabetes mellitus with other diabetic neurological complication: Secondary | ICD-10-CM

## 2018-05-05 DIAGNOSIS — M779 Enthesopathy, unspecified: Principal | ICD-10-CM

## 2018-05-05 DIAGNOSIS — L84 Corns and callosities: Secondary | ICD-10-CM

## 2018-05-05 DIAGNOSIS — I739 Peripheral vascular disease, unspecified: Secondary | ICD-10-CM

## 2018-05-08 NOTE — Progress Notes (Addendum)
Subjective:   Patient ID: Lindsay [name], female   DOB: 37 y.o.   MRN: [id number]   HPI 37 year old female presents the office today for pain on her right foot, pointing to submetatarsal 1 which is been ongoing for about 2 months.  She states that she is a thick callus.  She comes with a pumice stone.  She states the area does come back.  She denies any recent redness or drainage or any swelling to the area.  No recent injury or trauma.  Her last A1c was 9.  She has no other concerns.   Review of Systems  All other systems reviewed and are negative.  Past Medical History:  Diagnosis Date  . ARF (acute renal failure) ([place]) 04/2015  . Asthma   . Cellulitis of right upper extremity   . Coronary artery disease   . Diabetes mellitus    insulin dependent  . Hyperlipidemia LDL goal <70   . Hypertension   . NSTEMI (non-ST elevated myocardial infarction) ([place]) 08/2016  . Obesity   . S/P angioplasty with stent 08/2016   DES to mLAD and PTCA only to 2nd diag ostium.     Past Surgical History:  Procedure Laterality Date  . CARDIAC CATHETERIZATION N/A 09/07/2016   Procedure: Left Heart Cath and Coronary Angiography;  Surgeon: [name], MD;  Location: [facility];  Service: Cardiovascular;  Laterality: N/A;  . CARDIAC CATHETERIZATION N/A 09/07/2016   Procedure: Coronary Stent Intervention;  Surgeon: [name], MD;  Location: [facility];  Service: Cardiovascular;  Laterality: N/A;  . CARDIAC CATHETERIZATION N/A 09/07/2016   Procedure: Coronary Balloon Angioplasty;  Surgeon: [name], MD;  Location: [facility];  Service: Cardiovascular;  Laterality: N/A;  . CESAREAN SECTION    . IRRIGATION AND DEBRIDEMENT SHOULDER Right 04/30/2015   Procedure: IRRIGATION AND DEBRIDEMENT SHOULDER;  Surgeon: Renette Butters, MD;  Location: [facility];  Service: Orthopedics;  Laterality: Right;  . IRRIGATION AND DEBRIDEMENT SHOULDER Right 05/01/2015  . LEG SURGERY    . SHOULDER  ARTHROSCOPY Right 04/30/2015   Procedure: ARTHROSCOPY SHOULDER;  Surgeon: Renette Butters, MD;  Location: [facility];  Service: Orthopedics;  Laterality: Right;  . TONSILLECTOMY       Current Outpatient Medications:  .  albuterol (PROVENTIL HFA) 108 (90 Base) MCG/ACT inhaler, INHALE 2 PUFFS BY MOUTH EVERY 6 HOURS AS NEEDED FOR WHEEZING OR SHORTNESS OF BREATH, Disp: 1 each, Rfl: 11 .  amLODipine (NORVASC) 5 MG tablet, Take 1 tablet (5 mg total) by mouth daily., Disp: 30 tablet, Rfl: 11 .  aspirin 81 MG tablet, Take 1 tablet (81 mg total) by mouth daily., Disp: 30 tablet, Rfl: 11 .  aspirin-acetaminophen-caffeine (EXCEDRIN MIGRAINE) 250-250-65 MG tablet, Take 2 tablets by mouth every 6 (six) hours as needed for headache., Disp: , Rfl:  .  atorvastatin (LIPITOR) 80 MG tablet, Take 1 tablet (80 mg total) by mouth daily at 6 PM. Please call and schedule November/December follow up. [phone], Disp: 30 tablet, Rfl: 11 .  b complex vitamins tablet, Take 1 tablet by mouth daily., Disp: , Rfl:  .  Blood Glucose Monitoring Suppl (ACCU-CHEK AVIVA PLUS) w/Device KIT, 1 each by Does not apply route as directed., Disp: 1 kit, Rfl: 0 .  Camphor-Eucalyptus-Menthol (VICKS VAPORUB EX), Apply 1 application topically as needed (Congestion)., Disp: , Rfl:  .  diclofenac sodium (VOLTAREN) 1 % GEL, Apply 2 g topically 4 (four) times daily. (Patient taking  differently: Apply 2 g topically 4 (four) times daily as needed (Pain). ), Disp: 300 g, Rfl: 1 .  diphenhydrAMINE (BENADRYL) 25 MG tablet, Take 1 tablet (25 mg total) by mouth every 6 (six) hours as needed., Disp: 30 tablet, Rfl: 0 .  DULoxetine (CYMBALTA) 30 MG capsule, Take 1 capsule (30 mg total) by mouth daily., Disp: 30 capsule, Rfl: 11 .  empagliflozin (JARDIANCE) 10 MG TABS tablet, Take 10 mg by mouth daily with breakfast., Disp: 30 tablet, Rfl: 11 .  furosemide (LASIX) 20 MG tablet, Take 1 tablet (20 mg total) by mouth daily for 7 days., Disp: 30 tablet, Rfl:  0 .  gabapentin (NEURONTIN) 300 MG capsule, Take 2 capsules (600 mg total) by mouth 3 (three) times daily., Disp: 180 capsule, Rfl: 11 .  GARLIC PO, Take 1 tablet by mouth 2 (two) times daily., Disp: , Rfl:  .  glucose blood (ACCU-CHEK AVIVA PLUS) test strip, Use as instructed to test blood sugar 4 times daily., Disp: 100 each, Rfl: 12 .  ibuprofen (ADVIL,MOTRIN) 200 MG tablet, Take 400 mg by mouth every 6 (six) hours as needed for fever or moderate pain., Disp: , Rfl:  .  insulin aspart (NOVOLOG) 100 UNIT/ML injection, Use per sliding scale: If sugar 150-200 take 4 units If sugar 201-251 take 6 units If sugar 251-300 take 8 units If sugar 301-350 take 10 units If sugar 351-400 take 12 units, Disp: 10 mL, Rfl: 11 .  Insulin Glargine (LANTUS SOLOSTAR) 100 UNIT/ML Solostar Pen, Inject 60 units at bedtime., Disp: 10 pen, Rfl: 11 .  insulin regular human CONCENTRATED (HUMULIN R) 500 UNIT/ML injection, Inject 10-25 Units into the skin 3 (three) times daily with meals. , Disp: , Rfl:  .  losartan (COZAAR) 25 MG tablet, Take 1 tablet (25 mg total) by mouth daily., Disp: 30 tablet, Rfl: 11 .  Magnesium 250 MG TABS, Take 1 tablet by mouth 2 (two) times daily., Disp: , Rfl:  .  Melatonin 10 MG TABS, Take 3 tablets by mouth at bedtime., Disp: 30 tablet, Rfl: 2 .  meloxicam (MOBIC) 15 MG tablet, TAKE ONE TABLET BY MOUTH ONCE DAILY .MUST  HAVE  OFFICE  VISIT  FOR  REFILLS, Disp: 30 tablet, Rfl: 1 .  methocarbamol (ROBAXIN) 750 MG tablet, Take 1 tablet (750 mg total) by mouth at bedtime., Disp: 30 tablet, Rfl: 11 .  metoprolol tartrate (LOPRESSOR) 50 MG tablet, Take 1 tablet (50 mg total) by mouth 2 (two) times daily., Disp: 60 tablet, Rfl: 11 .  Multiple Vitamins-Minerals (MULTIVITAMIN PO), Take 1 tablet by mouth daily., Disp: , Rfl:  .  nitroGLYCERIN (NITROSTAT) 0.4 MG SL tablet, Place 1 tablet (0.4 mg total) under the tongue every 5 (five) minutes as needed for chest pain., Disp: 25 tablet, Rfl: 11 .  sodium  chloride (OCEAN) 0.65 % SOLN nasal spray, Place 1 spray into both nostrils as needed for congestion., Disp: 1 Bottle, Rfl: 0 .  traMADol (ULTRAM) 50 MG tablet, Take 1 tablet (50 mg total) by mouth 2 (two) times daily., Disp: 60 tablet, Rfl: 0  Allergies  Allergen Reactions  . Hydrazine Yellow [Tartrazine] Shortness Of Breath and Swelling    Swelling mostly noticed in legs and feet, retaining urination, shortness of breaht, and minor chest pain  . Lisinopril Shortness Of Breath    Was on prinzide; had sob/chest pain on it.  . Tylenol [Acetaminophen] Itching and Swelling    Itching of the mouth, swelling of tongue and stomach  started hurting          Objective:  Physical Exam  General: AAO x3, NAD  Dermatological: Small hyperkeratotic lesion right foot submetatarsal 100 and the medial sesamoid.  There is no other open lesions or pre-ulcerative lesion identified today.  Vascular: DP pulses 2/4, PT pulses 1/4.  Mild chronic ankle swelling present.  Immediate capillary refill time to all the digits present.  Neruologic: Sensation minimally decreased with Derrel Nip monofilament.  Musculoskeletal: Tenderness the hyperkeratotic lesion left plantar submetatarsal 1 underneath medial sesamoid but there is no other area tenderness.  Prominence of metatarsal heads.  There is no pain to vibratory sensation.  Muscular strength 5/5 in all groups tested bilateral. Decrease in medial arch height bilaterally.   Gait: Unassisted, Nonantalgic.       Assessment:   37 year old female with hyperkeratotic lesion right submetatarsal 1     Plan:  -Treatment options discussed including all alternatives, risks, and complication -Etiology of symptoms were discussed -X-rays were obtained and reviewed with the patient.  No evidence of acute fracture, stress fracture, foreign body. -Hyperkeratotic lesion was sharply debrided x1 without any complications or bleeding. -Discussed offloading pads, shoe  modifications.  Also discussed diabetic shoes and she is interested in getting this.  Paperwork was completed today for precertification diabetic shoes. Given prominent metatarsal heads, flatfoot, callus, and neuropathy I do recommend diabetic shoes.  -ABI was done in the office to ensure adequate circulation.  Left side is 1.36 right was 1.28. -I will see her back in 3 months however we will have her follow-up with [facility] for diabetic shoes.  Trula Slade DPM

## 2018-05-10 ENCOUNTER — Encounter: Payer: Medicaid Other | Admitting: Nutrition

## 2018-05-23 ENCOUNTER — Other Ambulatory Visit: Payer: Self-pay | Admitting: Internal Medicine

## 2018-05-23 NOTE — Telephone Encounter (Signed)
Kumar pt.

## 2018-05-26 ENCOUNTER — Other Ambulatory Visit: Payer: Medicaid Other

## 2018-05-31 ENCOUNTER — Ambulatory Visit: Payer: Medicaid Other | Admitting: Endocrinology

## 2018-06-07 ENCOUNTER — Other Ambulatory Visit: Payer: Self-pay | Admitting: Endocrinology

## 2018-06-22 ENCOUNTER — Other Ambulatory Visit (INDEPENDENT_AMBULATORY_CARE_PROVIDER_SITE_OTHER): Payer: Self-pay | Admitting: Physician Assistant

## 2018-06-22 DIAGNOSIS — M5442 Lumbago with sciatica, left side: Principal | ICD-10-CM

## 2018-06-22 DIAGNOSIS — G8929 Other chronic pain: Secondary | ICD-10-CM

## 2018-06-24 NOTE — Telephone Encounter (Signed)
FWD to PCP. Acquanetta Cabanilla S Finlee Concepcion, CMA  

## 2018-06-27 ENCOUNTER — Telehealth: Payer: Self-pay | Admitting: Podiatry

## 2018-06-27 NOTE — Telephone Encounter (Signed)
Pt called and left message stating you had discussed diabetic shoes with pt but she did not get a rx for them when she left.  She is medicaid pt and would need rx to go elsewhere to get the shoes/inserts.

## 2018-06-28 NOTE — Telephone Encounter (Signed)
If she is [employer] can we not do diabetic shoes? Not sure what that insurance is. Thanks.

## 2018-06-30 NOTE — Telephone Encounter (Signed)
Gave to Dawn.

## 2018-07-18 ENCOUNTER — Other Ambulatory Visit: Payer: Medicaid Other

## 2018-07-18 DIAGNOSIS — E1165 Type 2 diabetes mellitus with hyperglycemia: Secondary | ICD-10-CM

## 2018-07-18 DIAGNOSIS — Z794 Long term (current) use of insulin: Principal | ICD-10-CM

## 2018-07-19 LAB — FRUCTOSAMINE: Fructosamine: 308 umol/L — ABNORMAL HIGH (ref 0–285)

## 2018-07-20 ENCOUNTER — Ambulatory Visit: Payer: Medicaid Other | Admitting: Endocrinology

## 2018-07-20 ENCOUNTER — Encounter: Payer: Self-pay | Admitting: Endocrinology

## 2018-07-20 DIAGNOSIS — Z0289 Encounter for other administrative examinations: Secondary | ICD-10-CM

## 2018-07-26 DIAGNOSIS — R21 Rash and other nonspecific skin eruption: Secondary | ICD-10-CM | POA: Diagnosis not present

## 2018-08-01 ENCOUNTER — Telehealth: Payer: Self-pay | Admitting: Endocrinology

## 2018-08-01 NOTE — Telephone Encounter (Signed)
Patient dismissed from [facility] by Reather Littler MD, effective July 20, 2018. Dismissal letter sent out by certified / registered mail.  daj

## 2018-08-05 ENCOUNTER — Ambulatory Visit: Payer: Medicaid Other | Admitting: Podiatry

## 2018-08-05 DIAGNOSIS — M79675 Pain in left toe(s): Secondary | ICD-10-CM

## 2018-08-05 DIAGNOSIS — B351 Tinea unguium: Secondary | ICD-10-CM

## 2018-08-05 DIAGNOSIS — E1149 Type 2 diabetes mellitus with other diabetic neurological complication: Secondary | ICD-10-CM | POA: Diagnosis not present

## 2018-08-05 DIAGNOSIS — M216X1 Other acquired deformities of right foot: Secondary | ICD-10-CM

## 2018-08-05 DIAGNOSIS — M79674 Pain in right toe(s): Secondary | ICD-10-CM | POA: Diagnosis not present

## 2018-08-05 DIAGNOSIS — Q828 Other specified congenital malformations of skin: Secondary | ICD-10-CM

## 2018-08-05 NOTE — Patient Instructions (Signed)

## 2018-08-09 NOTE — Progress Notes (Signed)
Subjective: 37 year old female presents the office today for evaluation of pain to her right foot.  She does state that her pain is been spent on the right foot she is doing well.  She is in for the callus to be trimmed today on the right foot she is also asking for nails be trimmed as they are elongated she cannot trim them herself.  She did not get the prescription that we gave her for the diabetic shoes. Denies any systemic complaints such as fevers, chills, nausea, vomiting. No acute changes since last appointment, and no other complaints at this time.   Objective: AAO x3, NAD DP pulses 2/4, PT pulse 1/4, mild chronic ankle swelling present, CRT less than 3 seconds Protective sensation decreased with Simms Weinstein monofilament Mild hyperkeratotic lesion right foot submetatarsal 1.  Discussed that where she is tenderness upon debridement there is no underlying ulceration, drainage or any signs of infection noted today.  Nails are elongated with mild discoloration of the nails are hypertrophic.  There is subjective tenderness nails 1-5 bilaterally but there is no edema, erythema, drainage or pus or any signs of infection.  No open lesions or pre-ulcerative lesions.  No pain with calf compression, swelling, warmth, erythema  Assessment: Hyperkeratotic lesion, symptomatic onychomycosis  Plan: -All treatment options discussed with the patient including all alternatives, risks, complications.  -The hyperkeratotic lesion was sharply debrided x1 without any complications or bleeding. -Nails sharply debrided x10 without any complications or bleeding -Prescription for [employer] for diabetic shoes were given to her today. -Discussed the importance of daily foot inspection -Patient encouraged to call the office with any questions, concerns, change in symptoms.   Return in about 3 months (around 11/05/2018).  Vivi Barrack DPM

## 2018-09-28 ENCOUNTER — Other Ambulatory Visit: Payer: Self-pay

## 2018-09-28 ENCOUNTER — Encounter: Payer: Self-pay | Admitting: Registered Nurse

## 2018-09-28 ENCOUNTER — Encounter: Payer: Medicaid Other | Attending: Registered Nurse | Admitting: Registered Nurse

## 2018-09-28 VITALS — BP 143/92 | HR 79 | Ht 67.5 in | Wt >= 6400 oz

## 2018-09-28 DIAGNOSIS — G8929 Other chronic pain: Secondary | ICD-10-CM

## 2018-09-28 DIAGNOSIS — E669 Obesity, unspecified: Secondary | ICD-10-CM | POA: Diagnosis not present

## 2018-09-28 DIAGNOSIS — Z5181 Encounter for therapeutic drug level monitoring: Secondary | ICD-10-CM

## 2018-09-28 DIAGNOSIS — Z760 Encounter for issue of repeat prescription: Secondary | ICD-10-CM | POA: Insufficient documentation

## 2018-09-28 DIAGNOSIS — I10 Essential (primary) hypertension: Secondary | ICD-10-CM | POA: Diagnosis not present

## 2018-09-28 DIAGNOSIS — Z791 Long term (current) use of non-steroidal anti-inflammatories (NSAID): Secondary | ICD-10-CM | POA: Diagnosis not present

## 2018-09-28 DIAGNOSIS — M5441 Lumbago with sciatica, right side: Secondary | ICD-10-CM | POA: Diagnosis not present

## 2018-09-28 DIAGNOSIS — I2510 Atherosclerotic heart disease of native coronary artery without angina pectoris: Secondary | ICD-10-CM | POA: Diagnosis not present

## 2018-09-28 DIAGNOSIS — M25561 Pain in right knee: Secondary | ICD-10-CM | POA: Diagnosis not present

## 2018-09-28 DIAGNOSIS — Z955 Presence of coronary angioplasty implant and graft: Secondary | ICD-10-CM | POA: Diagnosis not present

## 2018-09-28 DIAGNOSIS — I252 Old myocardial infarction: Secondary | ICD-10-CM | POA: Insufficient documentation

## 2018-09-28 DIAGNOSIS — G894 Chronic pain syndrome: Secondary | ICD-10-CM | POA: Diagnosis not present

## 2018-09-28 DIAGNOSIS — E119 Type 2 diabetes mellitus without complications: Secondary | ICD-10-CM | POA: Insufficient documentation

## 2018-09-28 DIAGNOSIS — Z8249 Family history of ischemic heart disease and other diseases of the circulatory system: Secondary | ICD-10-CM | POA: Diagnosis not present

## 2018-09-28 DIAGNOSIS — Z7982 Long term (current) use of aspirin: Secondary | ICD-10-CM | POA: Insufficient documentation

## 2018-09-28 DIAGNOSIS — M25562 Pain in left knee: Secondary | ICD-10-CM | POA: Insufficient documentation

## 2018-09-28 DIAGNOSIS — E785 Hyperlipidemia, unspecified: Secondary | ICD-10-CM | POA: Insufficient documentation

## 2018-09-28 DIAGNOSIS — Z79899 Other long term (current) drug therapy: Secondary | ICD-10-CM | POA: Diagnosis not present

## 2018-09-28 DIAGNOSIS — F419 Anxiety disorder, unspecified: Secondary | ICD-10-CM | POA: Diagnosis not present

## 2018-09-28 DIAGNOSIS — M79604 Pain in right leg: Secondary | ICD-10-CM | POA: Diagnosis not present

## 2018-09-28 DIAGNOSIS — Z794 Long term (current) use of insulin: Secondary | ICD-10-CM | POA: Insufficient documentation

## 2018-09-28 DIAGNOSIS — J45909 Unspecified asthma, uncomplicated: Secondary | ICD-10-CM | POA: Diagnosis not present

## 2018-09-28 DIAGNOSIS — Z6841 Body Mass Index (BMI) 40.0 and over, adult: Secondary | ICD-10-CM | POA: Insufficient documentation

## 2018-09-28 DIAGNOSIS — Z7689 Persons encountering health services in other specified circumstances: Secondary | ICD-10-CM | POA: Diagnosis not present

## 2018-09-28 NOTE — Progress Notes (Signed)
Subjective:    Patient ID: Lindsay [name], female    DOB: 1981-12-20, 37 y.o.   MRN: [id number]  HPI: [name] is a 37 year old female who returns for follow up appointment for chronic pain and medication refill. She states her pain is located in her lower back radiating into her right lower extremity and bilateral knee pain. She rates her pain 9. Her current exercise regime is walking.  [name] arrived hypertensive and reports she did not take her anti-hypertensive medication. Educated on compliance and she verbalizes understanding.  Blood Pressure was re-checked.   [name] asked about resuming her Tramadol due to her UDS inconsistentcy, we will not be prescribing the Tramadol, she verbalizes understanding. Encouraged to continue with her Healthy Diet Regimen  and weight loss program. She has lost 10 ilbs she needs to lose 20lbs then she can be scheduled for Radiofrequency, she verbalizes understanding.   [name] remains non-narcotic. Last UDS was Performed on 03/29/2018, it was inconsistent. Not was Reviewed.    Pain Inventory Average Pain 10 Pain Right Now 9 My pain is constant, sharp and aching  In the last 24 hours, has pain interfered with the following? General activity 10 Relation with others 8 Enjoyment of life 10 What TIME of day is your pain at its worst? all Sleep (in general) Poor  Pain is worse with: walking, bending, sitting, inactivity, standing and some activites Pain improves with: rest, heat/ice and medication Relief from Meds: 3  Mobility walk without assistance how many minutes can you walk? 10 ability to climb steps?  yes do you drive?  no Do you have any goals in this area?  yes  Function not employed: date last employed n/a I need assistance with the following:  dressing, bathing, meal prep, household duties and shopping  Neuro/Psych weakness numbness tingling spasms anxiety  Prior Studies x-rays  Physicians involved  in your care Primary care Dr. Sindy Messing   Family History  Problem Relation Age of Onset  . Diabetes Mother   . Hypertension Mother   . Heart disease Mother   . Diabetes Father   . Heart disease Father   . Stroke Maternal Grandmother   . Cancer Maternal Grandmother    Social History   Socioeconomic History  . Marital status: Single    Spouse name: Not on file  . Number of children: Not on file  . Years of education: Not on file  . Highest education level: Not on file  Occupational History  . Not on file  Social Needs  . Financial resource strain: Not on file  . Food insecurity:    Worry: Not on file    Inability: Not on file  . Transportation needs:    Medical: Not on file    Non-medical: Not on file  Tobacco Use  . Smoking status: Never Smoker  . Smokeless tobacco: Never Used  Substance and Sexual Activity  . Alcohol use: Yes    Comment: socially  . Drug use: No  . Sexual activity: Not on file  Lifestyle  . Physical activity:    Days per week: Not on file    Minutes per session: Not on file  . Stress: Not on file  Relationships  . Social connections:    Talks on phone: Not on file    Gets together: Not on file    Attends religious service: Not on file    Active member of club or organization:  Not on file    Attends meetings of clubs or organizations: Not on file    Relationship status: Not on file  Other Topics Concern  . Not on file  Social History Narrative  . Not on file   Past Surgical History:  Procedure Laterality Date  . CARDIAC CATHETERIZATION N/A 09/07/2016   Procedure: Left Heart Cath and Coronary Angiography;  Surgeon: Lyn Records, MD;  Location: [facility] INVASIVE CV LAB;  Service: Cardiovascular;  Laterality: N/A;  . CARDIAC CATHETERIZATION N/A 09/07/2016   Procedure: Coronary Stent Intervention;  Surgeon: Lyn Records, MD;  Location: [facility] INVASIVE CV LAB;  Service: Cardiovascular;  Laterality: N/A;  . CARDIAC CATHETERIZATION N/A 09/07/2016    Procedure: Coronary Balloon Angioplasty;  Surgeon: Lyn Records, MD;  Location: [facility] INVASIVE CV LAB;  Service: Cardiovascular;  Laterality: N/A;  . CESAREAN SECTION    . IRRIGATION AND DEBRIDEMENT SHOULDER Right 04/30/2015   Procedure: IRRIGATION AND DEBRIDEMENT SHOULDER;  Surgeon: Sheral Apley, MD;  Location: [facility] OR;  Service: Orthopedics;  Laterality: Right;  . IRRIGATION AND DEBRIDEMENT SHOULDER Right 05/01/2015  . LEG SURGERY    . SHOULDER ARTHROSCOPY Right 04/30/2015   Procedure: ARTHROSCOPY SHOULDER;  Surgeon: Sheral Apley, MD;  Location: [facility] OR;  Service: Orthopedics;  Laterality: Right;  . TONSILLECTOMY     Past Medical History:  Diagnosis Date  . ARF (acute renal failure) (HCC) 04/2015  . Asthma   . Cellulitis of right upper extremity   . Coronary artery disease   . Diabetes mellitus    insulin dependent  . Hyperlipidemia LDL goal <70   . Hypertension   . NSTEMI (non-ST elevated myocardial infarction) (HCC) 08/2016  . Obesity   . S/P angioplasty with stent 08/2016   DES to mLAD and PTCA only to 2nd diag ostium.    BP (!) 149/101   Pulse 75   Ht 5' 7.5" (1.715 m)   Wt (!) 412 lb (186.9 kg)   SpO2 97%   BMI 63.58 kg/m   Opioid Risk Score:   Fall Risk Score:  `1  Depression screen PHQ 2/9  Depression screen [facility] 2/9 09/28/2018 01/13/2018 11/08/2017 05/28/2017 04/27/2017 04/13/2017 02/24/2017  Decreased Interest 0 0 0 0 0 0 0  Down, Depressed, Hopeless 0 0 0 0 0 0 0  PHQ - 2 Score 0 0 0 0 0 0 0  Some recent data might be hidden   Review of Systems  Constitutional: Positive for diaphoresis.  HENT: Negative.   Eyes: Negative.   Respiratory: Positive for shortness of breath.   Cardiovascular: Negative.   Gastrointestinal: Negative.   Endocrine: Negative.   Genitourinary: Negative.   Musculoskeletal: Negative.   Skin: Negative.   Allergic/Immunologic: Negative.   Neurological: Negative.   Hematological: Negative.   All other systems reviewed and are negative.       Objective:   Physical Exam  Constitutional: She is oriented to person, place, and time. She appears well-developed and well-nourished.  HENT:  Head: Normocephalic and atraumatic.  Neck: Normal range of motion. Neck supple.  Cardiovascular: Normal rate and regular rhythm.  Pulmonary/Chest: Effort normal and breath sounds normal.  Musculoskeletal:  Normal Muscle Bulk and Muscle Testing Reveals: Upper Extremities: Full ROM and Muscle Strength 5/5 Lumbar Paraspinal Tenderness: L-3-L-5 Right Greater Trochanter Tenderness Lower Extremities: Full ROM and Muscle Strength 5/5 Bilateral Lower Extremities Flexion Produces Pain into Bilateral Patella's Arises from Table Slowly Narrow Based Gait  Neurological: She is alert and oriented to  person, place, and time.  Skin: Skin is warm and dry.  Psychiatric: She has a normal mood and affect. Her behavior is normal.  Nursing note and vitals reviewed.         Assessment & Plan:  1. Chronic Right Sided Low Back Pain with right sided sciatica: Encourage to continue with weight loss program. After she loses 20 more lbs she can be scheduled for RF. 2. Bilateral Chronic Knee Pain: Continue to alternate Ice and Heat Therapy. Continue to Monitor.  3. Chronic Pain Syndrome: Encouraged to Continue HEP as Tolerated. Continue to monitor.   20 minutes of face to face patient care time was spent during this visit. All questions were encouraged and answered.  F/U in 6 months.

## 2018-11-04 ENCOUNTER — Ambulatory Visit: Payer: Medicaid Other | Admitting: Podiatry

## 2018-11-04 DIAGNOSIS — E1142 Type 2 diabetes mellitus with diabetic polyneuropathy: Secondary | ICD-10-CM | POA: Diagnosis not present

## 2018-11-04 DIAGNOSIS — M79674 Pain in right toe(s): Secondary | ICD-10-CM

## 2018-11-04 DIAGNOSIS — L84 Corns and callosities: Secondary | ICD-10-CM | POA: Diagnosis not present

## 2018-11-04 DIAGNOSIS — B351 Tinea unguium: Secondary | ICD-10-CM

## 2018-11-04 DIAGNOSIS — M79675 Pain in left toe(s): Secondary | ICD-10-CM

## 2018-11-10 ENCOUNTER — Telehealth: Payer: Self-pay

## 2018-11-10 NOTE — Telephone Encounter (Signed)
Received a fax request for records on this patient from [employer].  We do NOT handle these, so it has been faxed to medical records.   Please direct them in the future to the medical records department at [phone]

## 2018-11-16 ENCOUNTER — Telehealth: Payer: Self-pay | Admitting: Podiatry

## 2018-11-16 NOTE — Telephone Encounter (Signed)
This is Mardella Layman with [employer]. I'm calling to confirm you received the medical records request we faxed on 20 November. My number is [phone].

## 2018-11-19 ENCOUNTER — Emergency Department (HOSPITAL_COMMUNITY)
Admission: EM | Admit: 2018-11-19 | Discharge: 2018-11-20 | Disposition: A | Discharge status: Home / Self Care | Payer: Medicaid Other | Attending: Emergency Medicine | Admitting: Emergency Medicine

## 2018-11-19 ENCOUNTER — Other Ambulatory Visit: Payer: Self-pay

## 2018-11-19 ENCOUNTER — Encounter (HOSPITAL_COMMUNITY): Payer: Self-pay | Admitting: *Deleted

## 2018-11-19 DIAGNOSIS — Z79899 Other long term (current) drug therapy: Secondary | ICD-10-CM | POA: Diagnosis not present

## 2018-11-19 DIAGNOSIS — L03012 Cellulitis of left finger: Secondary | ICD-10-CM | POA: Insufficient documentation

## 2018-11-19 DIAGNOSIS — L539 Erythematous condition, unspecified: Secondary | ICD-10-CM | POA: Diagnosis present

## 2018-11-19 DIAGNOSIS — E119 Type 2 diabetes mellitus without complications: Secondary | ICD-10-CM | POA: Diagnosis not present

## 2018-11-19 DIAGNOSIS — I252 Old myocardial infarction: Secondary | ICD-10-CM | POA: Diagnosis not present

## 2018-11-19 DIAGNOSIS — Z794 Long term (current) use of insulin: Secondary | ICD-10-CM | POA: Insufficient documentation

## 2018-11-19 DIAGNOSIS — I10 Essential (primary) hypertension: Secondary | ICD-10-CM | POA: Insufficient documentation

## 2018-11-19 MED ORDER — DOXYCYCLINE HYCLATE 100 MG PO CAPS: 100.0000 mg | ORAL_CAPSULE | Freq: Two times a day (BID) | ORAL | 0 refills | Status: DC

## 2018-11-19 NOTE — ED Provider Notes (Signed)
[facility] EMERGENCY DEPARTMENT Provider Note   CSN: [id number] Arrival date & time: 11/19/18  2334     History   Chief Complaint Chief Complaint  Patient presents with  . Hand Pain    HPI Lindsay [name] is a 37 y.o. female.  Patient presents to the emergency department with chief complaint of left finger pain.  She states she had a hangnail and picked it.  She is developed a small infection.  She denies any fevers or chills.  Denies any treatments prior to arrival.  The history is provided by the patient. No language interpreter was used.    Past Medical History:  Diagnosis Date  . ARF (acute renal failure) ([place]) 04/2015  . Asthma   . Cellulitis of right upper extremity   . Coronary artery disease   . Diabetes mellitus    insulin dependent  . Hyperlipidemia LDL goal <70   . Hypertension   . NSTEMI (non-ST elevated myocardial infarction) ([place]) 08/2016  . Obesity   . S/P angioplasty with stent 08/2016   DES to mLAD and PTCA only to 2nd diag ostium.     Patient Active Problem List   Diagnosis Date Noted  . OSA (obstructive sleep apnea) 11/03/2016  . Coronary artery disease involving native coronary artery of native heart with angina pectoris ([place]) 09/11/2016  . Diabetes mellitus with complication ([place])   . Community acquired pneumonia 09/07/2016  . MI, old   . DM neuropathy, type II diabetes mellitus ([place]) 06/10/2016  . Morbid obesity ([place]) 06/10/2016  . Pressure ulcer 05/17/2015  . 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 ARF (acute renal failure) ([place])   . Septic arthritis ([place])   . Essential hypertension 04/30/2015  . Cellulitis 04/30/2015  . Abscess of right shoulder     Past Surgical History:  Procedure Laterality Date  . CARDIAC CATHETERIZATION N/A 09/07/2016   Procedure: Left Heart Cath and Coronary Angiography;  Surgeon: [name], MD;  Location: [facility] CV LAB;  Service: Cardiovascular;  Laterality: N/A;  . CARDIAC CATHETERIZATION  N/A 09/07/2016   Procedure: Coronary Stent Intervention;  Surgeon: [name], MD;  Location: [facility] CV LAB;  Service: Cardiovascular;  Laterality: N/A;  . CARDIAC CATHETERIZATION N/A 09/07/2016   Procedure: Coronary Balloon Angioplasty;  Surgeon: [name], MD;  Location: [facility] CV LAB;  Service: Cardiovascular;  Laterality: N/A;  . CESAREAN SECTION    . IRRIGATION AND DEBRIDEMENT SHOULDER Right 04/30/2015   Procedure: IRRIGATION AND DEBRIDEMENT SHOULDER;  Surgeon: Renette Butters, MD;  Location: [facility];  Service: Orthopedics;  Laterality: Right;  . IRRIGATION AND DEBRIDEMENT SHOULDER Right 05/01/2015  . LEG SURGERY    . SHOULDER ARTHROSCOPY Right 04/30/2015   Procedure: ARTHROSCOPY SHOULDER;  Surgeon: Renette Butters, MD;  Location: [facility];  Service: Orthopedics;  Laterality: Right;  . TONSILLECTOMY       OB History   None      Home Medications    Prior to Admission medications   Medication Sig Start Date End Date Taking? Authorizing Provider  albuterol (PROVENTIL HFA) 108 (90 Base) MCG/ACT inhaler INHALE 2 PUFFS BY MOUTH EVERY 6 HOURS AS NEEDED FOR WHEEZING OR SHORTNESS OF BREATH 01/13/18   Clent Demark, PA-C  amLODipine (NORVASC) 5 MG tablet Take 1 tablet (5 mg total) by mouth daily. 01/13/18   Clent Demark, PA-C  aspirin 81 MG tablet Take 1 tablet (81 mg total) by mouth daily. 01/13/18  Clent Demark, PA-C  aspirin-acetaminophen-caffeine (EXCEDRIN MIGRAINE) [phone] MG tablet Take 2 tablets by mouth every 6 (six) hours as needed for headache.    [provider]  atorvastatin (LIPITOR) 80 MG tablet Take 1 tablet (80 mg total) by mouth daily at 6 PM. Please call and schedule November/December follow up. [phone] 01/13/18   Clent Demark, PA-C  b complex vitamins tablet Take 1 tablet by mouth daily.    [provider]  BD VEO INSULIN SYRINGE U/F 31G X 15/64" 1 ML MISC USE  SYRINGE ONCE DAILY 06/07/18   Elayne Snare, MD  Blood  Glucose Monitoring Suppl (ACCU-CHEK AVIVA PLUS) w/Device KIT 1 each by Does not apply route as directed. 04/15/18   Elayne Snare, MD  Camphor-Eucalyptus-Menthol [facility] VAPORUB EX) Apply 1 application topically as needed (Congestion).    [provider]  diclofenac sodium (VOLTAREN) 1 % GEL Apply 2 g topically 4 (four) times daily. Patient taking differently: Apply 2 g topically 4 (four) times daily as needed (Pain).  10/14/17   Kirsteins, Luanna Salk, MD  diphenhydrAMINE (BENADRYL) 25 MG tablet Take 1 tablet (25 mg total) by mouth every 6 (six) hours as needed. 04/13/17   Clent Demark, PA-C  doxycycline (VIBRAMYCIN) 100 MG capsule Take 1 capsule (100 mg total) by mouth 2 (two) times daily. 11/19/18   Montine Circle, PA-C  DULoxetine (CYMBALTA) 30 MG capsule Take 1 capsule (30 mg total) by mouth daily. 01/13/18   Clent Demark, PA-C  empagliflozin (JARDIANCE) 10 MG TABS tablet Take 10 mg by mouth daily with breakfast. 01/13/18   Clent Demark, PA-C  furosemide (LASIX) 20 MG tablet Take 1 tablet (20 mg total) by mouth daily for 7 days. 04/20/18 04/27/18  Fatima Blank, MD  gabapentin (NEURONTIN) 300 MG capsule Take 2 capsules (600 mg total) by mouth 3 (three) times daily. 01/13/18   Clent Demark, PA-C  GARLIC PO Take 1 tablet by mouth 2 (two) times daily.    [provider]  glucose blood (ACCU-CHEK AVIVA PLUS) test strip Use as instructed to test blood sugar 4 times daily. 04/14/18   Elayne Snare, MD  ibuprofen (ADVIL,MOTRIN) 200 MG tablet Take 400 mg by mouth every 6 (six) hours as needed for fever or moderate pain.    [provider]  insulin aspart (NOVOLOG) 100 UNIT/ML injection Use per sliding scale: If sugar 150-200 take 4 units If sugar 201-251 take 6 units If sugar 251-300 take 8 units If sugar 301-350 take 10 units If sugar 351-400 take 12 units 01/13/18   Clent Demark, PA-C  Insulin Glargine (LANTUS SOLOSTAR) 100 UNIT/ML Solostar Pen  Inject 60 units at bedtime. 01/13/18   Clent Demark, PA-C  insulin regular human CONCENTRATED (HUMULIN R) 500 UNIT/ML injection Inject 10-25 Units into the skin 3 (three) times daily with meals.     [provider]  insulin regular human CONCENTRATED (HUMULIN R) 500 UNIT/ML injection INJECT 70 UNITS 30 MINUTES BEFORE EACH MEAL, 50 UNITS AT BEDTIME 05/23/18   Elayne Snare, MD  losartan (COZAAR) 25 MG tablet Take 1 tablet (25 mg total) by mouth daily. 01/13/18   Clent Demark, PA-C  Magnesium 250 MG TABS Take 1 tablet by mouth 2 (two) times daily.    [provider]  Melatonin 10 MG TABS Take 3 tablets by mouth at bedtime. 01/13/18   Clent Demark, PA-C  meloxicam (MOBIC) 15 MG tablet TAKE ONE TABLET BY MOUTH ONCE DAILY .  MUST  HAVE  OFFICE  VISIT  FOR  REFILLS 01/13/18   Clent Demark, PA-C  methocarbamol (ROBAXIN) 750 MG tablet Take 1 tablet (750 mg total) by mouth at bedtime. 01/13/18   Clent Demark, PA-C  metoprolol tartrate (LOPRESSOR) 50 MG tablet Take 1 tablet (50 mg total) by mouth 2 (two) times daily. 01/13/18   Clent Demark, PA-C  Multiple Vitamins-Minerals (MULTIVITAMIN PO) Take 1 tablet by mouth daily.    [provider]  nitroGLYCERIN (NITROSTAT) 0.4 MG SL tablet Place 1 tablet (0.4 mg total) under the tongue every 5 (five) minutes as needed for chest pain. 01/13/18   Clent Demark, PA-C  sodium chloride (OCEAN) 0.65 % SOLN nasal spray Place 1 spray into both nostrils as needed for congestion. 02/19/17   Clent Demark, PA-C    Family History Family History  Problem Relation Age of Onset  . Diabetes Mother   . Hypertension Mother   . Heart disease Mother   . Diabetes Father   . Heart disease Father   . Stroke Maternal Grandmother   . Cancer Maternal Grandmother     Social History Social History   Tobacco Use  . Smoking status: Never Smoker  . Smokeless tobacco: Never Used  Substance Use Topics  . Alcohol use: Yes     Comment: socially  . Drug use: No     Allergies   Hydrazine yellow [tartrazine]; Lisinopril; and Tylenol [acetaminophen]   Review of Systems Review of Systems  All other systems reviewed and are negative.    Physical Exam Updated Vital Signs BP (!) 157/98 (BP Location: Right Wrist)   Pulse 78   Temp 98.2 F (36.8 C) (Oral)   Resp 18   Ht 5' 7.5" (1.715 m)   Wt (!) 187.3 kg   SpO2 99%   BMI 63.73 kg/m   Physical Exam  Constitutional: She is oriented to person, place, and time. She appears well-developed and well-nourished.  HENT:  Head: Normocephalic and atraumatic.  Eyes: Conjunctivae and EOM are normal.  Neck: Normal range of motion.  Cardiovascular: Normal rate.  Pulmonary/Chest: Effort normal.  Abdominal: She exhibits no distension.  Musculoskeletal: Normal range of motion.  Neurological: She is alert and oriented to person, place, and time.  Skin: Skin is dry.  Small paronychia to ulnar aspect of left ring finger  Psychiatric: She has a normal mood and affect. Her behavior is normal. Judgment and thought content normal.  Nursing note and vitals reviewed.    ED Treatments / Results  Labs (all labs ordered are listed, but only abnormal results are displayed) Labs Reviewed - No data to display  EKG None  Radiology No results found.  Procedures Procedures (including critical care time) INCISION AND DRAINAGE Performed by: Montine Circle Consent: Verbal consent obtained. Risks and benefits: risks, benefits and alternatives were discussed Type: abscess  Body area: left ring finger  Anesthesia: none  Incision was made with a scalpel.  Local anesthetic: none  Anesthetic total: none  Complexity: simple Blunt dissection to break up loculations  Drainage: purulent  Drainage amount: moderate  Packing material: none  Patient tolerance: Patient tolerated the procedure well with no immediate complications.    Medications Ordered in  ED Medications - No data to display   Initial Impression / Assessment and Plan / ED Course  I have reviewed the triage vital signs and the nursing notes.  Pertinent labs & imaging results that were available during my care of  the patient were reviewed by me and considered in my medical decision making (see chart for details).       Final Clinical Impressions(s) / ED Diagnoses   Final diagnoses:  Paronychia of finger of left hand    ED Discharge Orders         Ordered    doxycycline (VIBRAMYCIN) 100 MG capsule  2 times daily     11/19/18 2359           Montine Circle, PA-C 11/20/18 0001    Duffy Bruce, MD 11/21/18 [phone]

## 2018-11-19 NOTE — ED Triage Notes (Signed)
Pt had hang nail on left ring finger last Saturday and then noticed it hurting on Wednesday.

## 2018-11-21 ENCOUNTER — Encounter: Payer: Self-pay | Admitting: Podiatry

## 2018-11-21 NOTE — Progress Notes (Signed)
Subjective: Lindsay [name] presents today with diabetes and cc of painful, discolored, thick toenails which interfere with daily activities.  Pain is aggravated when wearing enclosed shoe gear. Pain is relieved with periodic professional debridement.  Her last HgA1c was 9.0% in April. Pt states she is working on getting it down.  She is requesting diabetic shoe prescription on today as [employer] does not provide diabetic shoes for Medicaid recipients.  Objective: Vascular Examination: Capillary refill time <3 seconds x 10 digits Dorsalis pedis 2/4 b/l  Posterior tibial pulses 1/4 b/l Sparse digital hair x 10 digits Skin temperature gradient is WNL b/l Trace edema b/l ankles  Dermatological Examination: Skin with normal turgor, texture and tone b/l  Toenails 1-5 b/l discolored, thick, dystrophic with subungual debris and pain with palpation to nailbeds due to thickness of nails.  Hyperkeratotic lesion submet head 1 right foot. No edema, no erythema, no flocculence, no drainage.  Musculoskeletal: Muscle strength 5/5 to all LE muscle groups  Neurological: Sensation decreased with 10 gram monofilament.  Assessment: 1. Painful onychomycosis toenails 1-5 b/l  2. Callus submet head 1 right foot 3. NIDDM with neuropathy  Plan: 1. Toenails 1-5 b/l were debrided in length and girth without iatrogenic bleeding. 2. Callus pared submet head 1 right foot 3. Prescription dispensed for diabetic shoes and inserts for Level 4 Orthotics and Prosthetics. 4. Patient to continue soft, supportive shoe gear 5. Patient to report any pedal injuries to medical professional immediately. 6. Follow up 3 months. Patient/POA to call should there be a concern in the interim.

## 2018-12-15 ENCOUNTER — Encounter: Payer: Self-pay | Admitting: Interventional Cardiology

## 2018-12-16 ENCOUNTER — Other Ambulatory Visit (INDEPENDENT_AMBULATORY_CARE_PROVIDER_SITE_OTHER): Payer: Self-pay | Admitting: Physician Assistant

## 2018-12-19 NOTE — Telephone Encounter (Signed)
FWD to PCP. Tempestt S Roberts, CMA  

## 2019-01-11 ENCOUNTER — Ambulatory Visit (INDEPENDENT_AMBULATORY_CARE_PROVIDER_SITE_OTHER): Payer: Medicaid Other | Admitting: Nurse Practitioner

## 2019-01-11 ENCOUNTER — Emergency Department (HOSPITAL_COMMUNITY)
Admission: EM | Admit: 2019-01-11 | Discharge: 2019-01-11 | Disposition: A | Discharge status: Home / Self Care | Payer: Medicaid Other | Attending: Emergency Medicine | Admitting: Emergency Medicine

## 2019-01-11 ENCOUNTER — Encounter (HOSPITAL_COMMUNITY): Payer: Self-pay | Admitting: *Deleted

## 2019-01-11 ENCOUNTER — Other Ambulatory Visit: Payer: Self-pay

## 2019-01-11 ENCOUNTER — Emergency Department (HOSPITAL_COMMUNITY): Payer: Medicaid Other

## 2019-01-11 ENCOUNTER — Encounter (INDEPENDENT_AMBULATORY_CARE_PROVIDER_SITE_OTHER): Payer: Self-pay | Admitting: Nurse Practitioner

## 2019-01-11 VITALS — BP 143/93 | HR 86 | Temp 97.9°F | Resp 20 | Wt >= 6400 oz

## 2019-01-11 DIAGNOSIS — Z79899 Other long term (current) drug therapy: Secondary | ICD-10-CM | POA: Insufficient documentation

## 2019-01-11 DIAGNOSIS — R200 Anesthesia of skin: Secondary | ICD-10-CM | POA: Diagnosis not present

## 2019-01-11 DIAGNOSIS — M5442 Lumbago with sciatica, left side: Secondary | ICD-10-CM

## 2019-01-11 DIAGNOSIS — L0291 Cutaneous abscess, unspecified: Secondary | ICD-10-CM

## 2019-01-11 DIAGNOSIS — I10 Essential (primary) hypertension: Secondary | ICD-10-CM | POA: Insufficient documentation

## 2019-01-11 DIAGNOSIS — E1165 Type 2 diabetes mellitus with hyperglycemia: Secondary | ICD-10-CM | POA: Diagnosis not present

## 2019-01-11 DIAGNOSIS — L02415 Cutaneous abscess of right lower limb: Secondary | ICD-10-CM | POA: Diagnosis not present

## 2019-01-11 DIAGNOSIS — E119 Type 2 diabetes mellitus without complications: Secondary | ICD-10-CM | POA: Diagnosis not present

## 2019-01-11 DIAGNOSIS — J45909 Unspecified asthma, uncomplicated: Secondary | ICD-10-CM | POA: Insufficient documentation

## 2019-01-11 DIAGNOSIS — Z9114 Patient's other noncompliance with medication regimen: Secondary | ICD-10-CM | POA: Insufficient documentation

## 2019-01-11 DIAGNOSIS — E118 Type 2 diabetes mellitus with unspecified complications: Principal | ICD-10-CM

## 2019-01-11 DIAGNOSIS — G8929 Other chronic pain: Secondary | ICD-10-CM | POA: Diagnosis not present

## 2019-01-11 DIAGNOSIS — Z7982 Long term (current) use of aspirin: Secondary | ICD-10-CM | POA: Diagnosis not present

## 2019-01-11 DIAGNOSIS — M79605 Pain in left leg: Secondary | ICD-10-CM | POA: Diagnosis not present

## 2019-01-11 DIAGNOSIS — Z794 Long term (current) use of insulin: Secondary | ICD-10-CM | POA: Diagnosis not present

## 2019-01-11 DIAGNOSIS — R202 Paresthesia of skin: Secondary | ICD-10-CM

## 2019-01-11 DIAGNOSIS — E782 Mixed hyperlipidemia: Secondary | ICD-10-CM

## 2019-01-11 DIAGNOSIS — R6883 Chills (without fever): Secondary | ICD-10-CM | POA: Diagnosis not present

## 2019-01-11 DIAGNOSIS — F5101 Primary insomnia: Secondary | ICD-10-CM | POA: Diagnosis not present

## 2019-01-11 DIAGNOSIS — E1140 Type 2 diabetes mellitus with diabetic neuropathy, unspecified: Secondary | ICD-10-CM | POA: Diagnosis not present

## 2019-01-11 DIAGNOSIS — I2510 Atherosclerotic heart disease of native coronary artery without angina pectoris: Secondary | ICD-10-CM | POA: Insufficient documentation

## 2019-01-11 LAB — CBC WITH DIFFERENTIAL/PLATELET
Abs Immature Granulocytes: 0.02 10*3/uL (ref 0.00–0.07)
BASOS ABS: 0 10*3/uL (ref 0.0–0.1)
Basophils Relative: 0 %
Eosinophils Absolute: 0.2 10*3/uL (ref 0.0–0.5)
Eosinophils Relative: 3 %
HCT: 43.7 % (ref 36.0–46.0)
Hemoglobin: 13.3 g/dL (ref 12.0–15.0)
Immature Granulocytes: 0 %
LYMPHS PCT: 39 %
Lymphs Abs: 2.7 10*3/uL (ref 0.7–4.0)
MCH: 26.1 pg (ref 26.0–34.0)
MCHC: 30.4 g/dL (ref 30.0–36.0)
MCV: 85.9 fL (ref 80.0–100.0)
Monocytes Absolute: 0.6 10*3/uL (ref 0.1–1.0)
Monocytes Relative: 8 %
Neutro Abs: 3.4 10*3/uL (ref 1.7–7.7)
Neutrophils Relative %: 50 %
Platelets: 283 10*3/uL (ref 150–400)
RBC: 5.09 MIL/uL (ref 3.87–5.11)
RDW: 12.7 % (ref 11.5–15.5)
WBC: 6.9 10*3/uL (ref 4.0–10.5)
nRBC: 0 % (ref 0.0–0.2)

## 2019-01-11 LAB — COMPREHENSIVE METABOLIC PANEL
ALBUMIN: 3.6 g/dL (ref 3.5–5.0)
ALT: 32 U/L (ref 0–44)
AST: 27 U/L (ref 15–41)
Alkaline Phosphatase: 93 U/L (ref 38–126)
Anion gap: 10 (ref 5–15)
BUN: 12 mg/dL (ref 6–20)
CO2: 27 mmol/L (ref 22–32)
Calcium: 9.9 mg/dL (ref 8.9–10.3)
Chloride: 103 mmol/L (ref 98–111)
Creatinine, Ser: 0.78 mg/dL (ref 0.44–1.00)
GFR calc Af Amer: >60 mL/min (ref >60)
GFR calc non Af Amer: >60 mL/min (ref >60)
GLUCOSE: 175 mg/dL — AB (ref 70–99)
Potassium: 4.1 mmol/L (ref 3.5–5.1)
SODIUM: 140 mmol/L (ref 135–145)
Total Bilirubin: 0.5 mg/dL (ref 0.3–1.2)
Total Protein: 8 g/dL (ref 6.5–8.1)

## 2019-01-11 LAB — POCT GLYCOSYLATED HEMOGLOBIN (HGB A1C): HbA1c, POC (controlled diabetic range): 10.1 % — AB (ref 0.0–7.0)

## 2019-01-11 LAB — LACTIC ACID, PLASMA
LACTIC ACID, VENOUS: 1.2 mmol/L (ref 0.5–1.9)
Lactic Acid, Venous: 1.3 mmol/L (ref 0.5–1.9)

## 2019-01-11 LAB — GLUCOSE, POCT (MANUAL RESULT ENTRY): POC GLUCOSE: 230 mg/dL — AB (ref 70–99)

## 2019-01-11 MED ORDER — DULOXETINE HCL 30 MG PO CPEP: 30.0000 mg | ORAL_CAPSULE | Freq: Every day | ORAL | 0 refills | Status: DC

## 2019-01-11 MED ORDER — SULFAMETHOXAZOLE-TRIMETHOPRIM 800-160 MG PO TABS: 1.0000 | ORAL_TABLET | Freq: Two times a day (BID) | ORAL | 0 refills | Status: AC

## 2019-01-11 MED ORDER — AMLODIPINE BESYLATE 10 MG PO TABS: 5.0000 mg | ORAL_TABLET | Freq: Every day | ORAL | 1 refills | Status: DC

## 2019-01-11 MED ORDER — EMPAGLIFLOZIN 10 MG PO TABS: 10.0000 mg | ORAL_TABLET | Freq: Every day | ORAL | 0 refills | Status: DC

## 2019-01-11 MED ORDER — INSULIN PEN NEEDLE 31G X 5 MM MISC
1 refills | Status: DC
Start: 2019-01-11 — End: 2019-04-13

## 2019-01-11 MED ORDER — LOSARTAN POTASSIUM 50 MG PO TABS: 50.0000 mg | ORAL_TABLET | Freq: Every day | ORAL | 0 refills | Status: DC

## 2019-01-11 MED ORDER — METHOCARBAMOL 750 MG PO TABS: 750.0000 mg | ORAL_TABLET | Freq: Every day | ORAL | 11 refills | Status: DC

## 2019-01-11 MED ORDER — ATORVASTATIN CALCIUM 80 MG PO TABS: 80.0000 mg | ORAL_TABLET | Freq: Every day | ORAL | 11 refills | Status: DC

## 2019-01-11 MED ORDER — GABAPENTIN 300 MG PO CAPS
600.0000 mg | ORAL_CAPSULE | Freq: Three times a day (TID) | ORAL | 0 refills | Status: DC
Start: 2019-01-11 — End: 2019-03-13

## 2019-01-11 MED ORDER — MELATONIN 10 MG PO TABS: 3.0000 | ORAL_TABLET | Freq: Every day | ORAL | 2 refills | Status: DC

## 2019-01-11 MED ORDER — INSULIN REGULAR HUMAN (CONC) 500 UNIT/ML ~~LOC~~ SOLN: SUBCUTANEOUS | 0 refills | Status: DC

## 2019-01-11 MED ORDER — CEPHALEXIN 500 MG PO CAPS: 500.0000 mg | ORAL_CAPSULE | Freq: Two times a day (BID) | ORAL | 0 refills | Status: AC

## 2019-01-11 MED ORDER — METOPROLOL TARTRATE 50 MG PO TABS: 50.0000 mg | ORAL_TABLET | Freq: Two times a day (BID) | ORAL | 11 refills | Status: DC

## 2019-01-11 MED ORDER — GLUCOSE BLOOD VI STRP: ORAL_STRIP | 12 refills | Status: DC

## 2019-01-11 MED ORDER — INSULIN GLARGINE 100 UNIT/ML SOLOSTAR PEN: 50.0000 [IU] | PEN_INJECTOR | Freq: Every day | SUBCUTANEOUS | 2 refills | Status: DC

## 2019-01-11 MED ORDER — MELOXICAM 15 MG PO TABS: ORAL_TABLET | ORAL | 1 refills | Status: DC

## 2019-01-11 NOTE — ED Notes (Signed)
Patient transported to X-ray 

## 2019-01-11 NOTE — ED Triage Notes (Signed)
Pt sent over from her PCP office to have an abscess on her leg further evaluated, notes from doctor indicate that pt was to have xray one is ordered but pt states she was told to check in

## 2019-01-11 NOTE — Progress Notes (Signed)
DM Blister on right leg- noticed around 12/26/2018

## 2019-01-11 NOTE — Progress Notes (Signed)
Cardiology Office Note:    Date:  01/12/2019   ID:  Lindsay [name], DOB 1981/12/01, MRN [id number]  PCP:  Clent Demark, PA-C  Cardiologist:  No primary care provider on file.   Referring MD: Clent Demark, PA-C   Chief Complaint  Patient presents with  . Coronary Artery Disease    History of Present Illness:    Lindsay [name] is a 38 y.o. female with a hx of  NSTEMI September 2017, with pk troponin 6.64 with cath and PCI with DES to mLAD and PTCA only to 2nd diag ostium. Still with 50% residual stenosis. EF with cath 35-45%but Echo was 55-60%  Complains of some irregular heartbeat.  This is noted when she is physically active and starts to cool down.  There is irregularity but no tachycardia.  No lightheadedness, dizziness, or chest pain.  Chest pain has occurred.  One episode of sharp shooting pain while watching TV this weekend.  No exertion related chest discomfort.  No episode lasting longer than seconds.  Awakens at night short of breath.  Has been tested for sleep apnea and evaluation was negative.  She does snore.  She does awaken from sleep feeling somewhat short of breath.  Ulcer on right shin recently requiring emergency room visit.  Physically an active and not achieving dietary control of carbohydrate intake.  Past Medical History:  Diagnosis Date  . ARF (acute renal failure) ([place]) 04/2015  . Asthma   . Cellulitis of right upper extremity   . Coronary artery disease   . Diabetes mellitus    insulin dependent  . Hyperlipidemia LDL goal <70   . Hypertension   . NSTEMI (non-ST elevated myocardial infarction) ([place]) 08/2016  . Obesity   . S/P angioplasty with stent 08/2016   DES to mLAD and PTCA only to 2nd diag ostium.     Past Surgical History:  Procedure Laterality Date  . CARDIAC CATHETERIZATION N/A 09/07/2016   Procedure: Left Heart Cath and Coronary Angiography;  Surgeon: [name], MD;  Location: [facility];  Service:  Cardiovascular;  Laterality: N/A;  . CARDIAC CATHETERIZATION N/A 09/07/2016   Procedure: Coronary Stent Intervention;  Surgeon: [name], MD;  Location: [facility];  Service: Cardiovascular;  Laterality: N/A;  . CARDIAC CATHETERIZATION N/A 09/07/2016   Procedure: Coronary Balloon Angioplasty;  Surgeon: [name], MD;  Location: [facility];  Service: Cardiovascular;  Laterality: N/A;  . CESAREAN SECTION    . IRRIGATION AND DEBRIDEMENT SHOULDER Right 04/30/2015   Procedure: IRRIGATION AND DEBRIDEMENT SHOULDER;  Surgeon: Renette Butters, MD;  Location: [facility];  Service: Orthopedics;  Laterality: Right;  . IRRIGATION AND DEBRIDEMENT SHOULDER Right 05/01/2015  . LEG SURGERY    . SHOULDER ARTHROSCOPY Right 04/30/2015   Procedure: ARTHROSCOPY SHOULDER;  Surgeon: Renette Butters, MD;  Location: [facility];  Service: Orthopedics;  Laterality: Right;  . TONSILLECTOMY      Current Medications: Current Meds  Medication Sig  . albuterol (PROVENTIL HFA) 108 (90 Base) MCG/ACT inhaler INHALE 2 PUFFS BY MOUTH EVERY 6 HOURS AS NEEDED FOR WHEEZING OR SHORTNESS OF BREATH (Patient taking differently: Inhale 2 puffs into the lungs every 6 (six) hours as needed for wheezing or shortness of breath. )  . amLODipine (NORVASC) 10 MG tablet Take 0.5 tablets (5 mg total) by mouth daily for 30 days.  Marland Kitchen aspirin 81 MG tablet Take 1 tablet (81 mg total) by mouth daily.  Marland Kitchen  aspirin-acetaminophen-caffeine (EXCEDRIN MIGRAINE) 250-250-65 MG tablet Take 2 tablets by mouth every 6 (six) hours as needed for headache.  Marland Kitchen atorvastatin (LIPITOR) 80 MG tablet Take 1 tablet (80 mg total) by mouth daily at 6 PM. Please call and schedule November/December follow up. [phone]  . b complex vitamins tablet Take 1 tablet by mouth daily.  . BD VEO INSULIN SYRINGE U/F 31G X 15/64" 1 ML MISC USE  SYRINGE ONCE DAILY  . Blood Glucose Monitoring Suppl (ACCU-CHEK AVIVA PLUS) w/Device KIT 1 each by Does not apply route as  directed.  . cephALEXin (KEFLEX) 500 MG capsule Take 1 capsule (500 mg total) by mouth 2 (two) times daily for 7 days.  . diclofenac sodium (VOLTAREN) 1 % GEL Apply 2 g topically 4 (four) times daily. (Patient taking differently: Apply 2 g topically 4 (four) times daily as needed (Pain). )  . diphenhydrAMINE (BENADRYL) 25 MG tablet Take 1 tablet (25 mg total) by mouth every 6 (six) hours as needed.  . DULoxetine (CYMBALTA) 30 MG capsule Take 1 capsule (30 mg total) by mouth daily.  . empagliflozin (JARDIANCE) 10 MG TABS tablet Take 10 mg by mouth daily with breakfast.  . gabapentin (NEURONTIN) 300 MG capsule Take 2 capsules (600 mg total) by mouth 3 (three) times daily.  Marland Kitchen GARLIC PO Take 1 tablet by mouth 2 (two) times daily.  Marland Kitchen glucose blood (ACCU-CHEK AVIVA PLUS) test strip Use as instructed to test blood sugar 4 times daily.  Marland Kitchen ibuprofen (ADVIL,MOTRIN) 200 MG tablet Take 400 mg by mouth every 6 (six) hours as needed for fever or moderate pain.  . Insulin Glargine (LANTUS SOLOSTAR) 100 UNIT/ML Solostar Pen Inject 50 Units into the skin at bedtime.  . Insulin Pen Needle (B-D UF III MINI PEN NEEDLES) 31G X 5 MM MISC Use as instructed. Monitor blood glucose levels three times per day  . insulin regular human CONCENTRATED (HUMULIN R) 500 UNIT/ML injection INJECT 70 UNITS 30 MINUTES BEFORE EACH MEAL, (Patient taking differently: Inject 70 Units into the skin See admin instructions. [place],)  . Magnesium 250 MG TABS Take 1 tablet by mouth 2 (two) times daily.  . Melatonin 10 MG TABS Take 3 tablets by mouth at bedtime.  . meloxicam (MOBIC) 15 MG tablet TAKE ONE TABLET BY MOUTH ONCE DAILY .MUST  HAVE  OFFICE  VISIT  FOR  REFILLS (Patient taking differently: Take 15 mg by mouth daily. )  . methocarbamol (ROBAXIN) 750 MG tablet Take 1 tablet (750 mg total) by mouth at bedtime.  . metoprolol tartrate (LOPRESSOR) 50 MG tablet Take 1 tablet (50 mg total) by mouth 2 (two) times daily.  .  Multiple Vitamins-Minerals (MULTIVITAMIN PO) Take 1 tablet by mouth daily.  . nitroGLYCERIN (NITROSTAT) 0.4 MG SL tablet Place 1 tablet (0.4 mg total) under the tongue every 5 (five) minutes as needed for chest pain.  . sodium chloride (OCEAN) 0.65 % SOLN nasal spray Place 1 spray into both nostrils as needed for congestion.  . sulfamethoxazole-trimethoprim (BACTRIM DS,SEPTRA DS) 800-160 MG tablet Take 1 tablet by mouth 2 (two) times daily for 10 days.  . [DISCONTINUED] losartan (COZAAR) 50 MG tablet Take 1 tablet (50 mg total) by mouth daily.     Allergies:   Hydrazine yellow [tartrazine]; Lisinopril; and Tylenol [acetaminophen]   Social History   Socioeconomic History  . Marital status: Single    Spouse name: Not on file  . Number of children: Not on file  .  Years of education: Not on file  . Highest education level: Not on file  Occupational History  . Not on file  Social Needs  . Financial resource strain: Not on file  . Food insecurity:    Worry: Not on file    Inability: Not on file  . Transportation needs:    Medical: Not on file    Non-medical: Not on file  Tobacco Use  . Smoking status: Never Smoker  . Smokeless tobacco: Never Used  Substance and Sexual Activity  . Alcohol use: Yes    Comment: socially  . Drug use: No  . Sexual activity: Not on file  Lifestyle  . Physical activity:    Days per week: Not on file    Minutes per session: Not on file  . Stress: Not on file  Relationships  . Social connections:    Talks on phone: Not on file    Gets together: Not on file    Attends religious service: Not on file    Active member of club or organization: Not on file    Attends meetings of clubs or organizations: Not on file    Relationship status: Not on file  Other Topics Concern  . Not on file  Social History Narrative  . Not on file     Family History: The patient's family history includes Cancer in her maternal grandmother; Diabetes in her father and  mother; Heart disease in her father and mother; Hypertension in her mother; Stroke in her maternal grandmother.  ROS:   Please see the history of present illness.    Snoring, anxiety, back pain, dyspnea on exertion.  Denies.  All other systems reviewed and are negative.  EKGs/Labs/Other Studies Reviewed:    The following studies were reviewed today: 2D Doppler echocardiogram September 2017: Study Conclusions  - Left ventricle: The cavity size was normal. There was mild   concentric hypertrophy. Systolic function was normal. The   estimated ejection fraction was in the range of 55% to 60%. Mild   hypokinesis of the anteroseptal, anterior, and apical myocardium.   Left ventricular diastolic function parameters were normal. - Mitral valve: Transvalvular velocity was within the normal range.   There was no evidence for stenosis. There was trivial   regurgitation. - Right ventricle: The cavity size was normal. Wall thickness was   normal. Systolic function was normal.  Cardiac catheterization September 2017: Diagnostic  Dominance: Right    Intervention      EKG:  EKG sinus rhythm, poor R wave progression V1 through V4, and otherwise unremarkable.  When compared to the most recent prior tracing from  May 2019, no significant change has occurred.  Recent Labs: 04/20/2018: B Natriuretic Peptide 21.5 01/11/2019: ALT 32; BUN 12; Creatinine, Ser 0.78; Hemoglobin 13.3; Platelets 283; Potassium 4.1; Sodium 140  Recent Lipid Panel    Component Value Date/Time   CHOL 199 01/11/2019 1156   TRIG 173 (H) 01/11/2019 1156   HDL 47 01/11/2019 1156   CHOLHDL 4.2 01/11/2019 1156   CHOLHDL 3 04/12/2018 1006   VLDL 38.6 04/12/2018 1006   LDLCALC 117 (H) 01/11/2019 1156    Physical Exam:    VS:  BP (!) 147/93   Pulse 93   Ht 5' 7.5" (1.715 m)   Wt (!) 403 lb 1.9 oz (182.9 kg)   LMP 12/19/2018   SpO2 96%   BMI 62.21 kg/m     Wt Readings from Last 3 Encounters:  01/12/19 (!) 403 lb  1.9  oz (182.9 kg)  01/11/19 (!) 402 lb (182.3 kg)  11/19/18 (!) 413 lb (187.3 kg)     GEN: Morbidly obese. No acute distress HEENT: Normal NECK: No JVD. LYMPHATICS: No lymphadenopathy CARDIAC: RRR.  No murmur, no gallop, no edema VASCULAR: 2+ radial and carotid pulses, no bruits RESPIRATORY:  Clear to auscultation without rales, wheezing or rhonchi  ABDOMEN: Soft, non-tender, non-distended, No pulsatile mass, MUSCULOSKELETAL: No deformity  SKIN: Warm and dry NEUROLOGIC:  Alert and oriented x 3 PSYCHIATRIC:  Normal affect   ASSESSMENT:    1. Coronary artery disease involving native coronary artery of native heart with angina pectoris ([place])   2. Essential hypertension   3. Morbid obesity ([place])   4. Diabetes mellitus with complication ([place])   5. OSA (obstructive sleep apnea)    PLAN:    In order of problems listed above:  1. Stable without angina.  Aggressive secondary risk prevention is discussed.  Major shortcomings at this time are physical inactivity and poor control of type 2 diabetes mellitus with the most recent hemoglobin A1c being greater than 10.  We had a long conversation concerning diet with elimination of potatoes and soft drinks being key. 2. Blood pressure is elevated.  Increase losartan to 100 mg/day.  Be met in 1 to 2 weeks.  Decrease salt in diet. 3. We discussed bariatric surgery.  She is hooked into the [facility] program although has been in a bottleneck due to recent loss of the provider who is running the program.  Now scheduled to be seen in the clinic in [place]. 4. SGLT2 therapy has been started in the form of empagliflozin.  Hemoglobin A1c less than 7 is our target.  Activity and alteration in diet will help.  Bariatric surgery will help. 5. The sleep study did document mild sleep apnea.  Not currently being treated and under the impression that she did not have sleep apnea.  Overall education and awareness concerning primary/secondary risk prevention was discussed  in detail: LDL less than 70, hemoglobin A1c less than 7, blood pressure target less than 130/80 mmHg, >150 minutes of moderate aerobic activity per week, avoidance of smoking, weight control (via diet and exercise), and continued surveillance/management of/for obstructive sleep apnea.  In addition to targeting a hemoglobin A1c less than 7 by conventional means, additional therapies to decrease CV risk should be considered.  SGLT2 inhibitor therapy ("-flozin" e.g. Invokana) decreases risk of heart failure development.  GLP-1 agonist therapy ("-glutide" agents e.g. Trulicity; or "-atide" agents e.g.Byetta) to reduce vascular events.  Stressed the importance of altering dietary intake and adding significant activity to help control sugars.  Keep appointment in the bariatric clinic.  1 year.    Medication Adjustments/Labs and Tests Ordered: Current medicines are reviewed at length with the patient today.  Concerns regarding medicines are outlined above.  Orders Placed This Encounter  Procedures  . Basic metabolic panel  . EKG 12-Lead   Meds ordered this encounter  Medications  . losartan (COZAAR) 100 MG tablet    Sig: Take 1 tablet (100 mg total) by mouth daily.    Dispense:  90 tablet    Refill:  3    Dose change    Patient Instructions  Medication Instructions:  1) INCREASE Losartan to 173m once daily  If you need a refill on your cardiac medications before your next appointment, please call your pharmacy.   Lab work: Your physician recommends that you return for lab work in: 2 weeks (  BMET)  If you have labs (blood work) drawn today and your tests are completely normal, you will receive your results only by: Marland Kitchen MyChart Message (if you have MyChart) OR . A paper copy in the mail If you have any lab test that is abnormal or we need to change your treatment, we will call you to review the results.  Testing/Procedures: None  Follow-Up: At [facility], you and your health  needs are our priority.  As part of our continuing mission to provide you with exceptional heart care, we have created designated Provider Care Teams.  These Care Teams include your primary Cardiologist (physician) and Advanced Practice Providers (APPs -  Physician Assistants and Nurse Practitioners) who all work together to provide you with the care you need, when you need it. You will need a follow up appointment in 12 months.  Please call our office 2 months in advance to schedule this appointment.  You may see Dr. [name] or one of the following Advanced Practice Providers on your designated Care Team:   Truitt Merle, NP Cecilie Kicks, NP . Kathyrn Drown, NP  Any Other Special Instructions Will Be Listed Below (If Applicable).  Your provider recommends that you maintain 150 minutes per week of moderate aerobic activity.   Carbohydrate Counting for Diabetes Mellitus, Adult  Carbohydrate counting is a method of keeping track of how many carbohydrates you eat. Eating carbohydrates naturally increases the amount of sugar (glucose) in the blood. Counting how many carbohydrates you eat helps keep your blood glucose within normal limits, which helps you manage your diabetes (diabetes mellitus). It is important to know how many carbohydrates you can safely have in each meal. This is different for every person. A diet and nutrition specialist (registered dietitian) can help you make a meal plan and calculate how many carbohydrates you should have at each meal and snack. Carbohydrates are found in the following foods:  Grains, such as breads and cereals.  Dried beans and soy products.  Starchy vegetables, such as potatoes, peas, and corn.  Fruit and fruit juices.  Milk and yogurt.  Sweets and snack foods, such as cake, cookies, candy, chips, and soft drinks. How do I count carbohydrates? There are two ways to count carbohydrates in food. You can use either of the methods or a combination of  both. Reading "Nutrition Facts" on packaged food The "Nutrition Facts" list is included on the labels of almost all packaged foods and beverages in the [place] It includes:  The serving size.  Information about nutrients in each serving, including the grams (g) of carbohydrate per serving. To use the "Nutrition Facts":  Decide how many servings you will have.  Multiply the number of servings by the number of carbohydrates per serving.  The resulting number is the total amount of carbohydrates that you will be having. Learning standard serving sizes of other foods When you eat carbohydrate foods that are not packaged or do not include "Nutrition Facts" on the label, you need to measure the servings in order to count the amount of carbohydrates:  Measure the foods that you will eat with a food scale or measuring cup, if needed.  Decide how many standard-size servings you will eat.  Multiply the number of servings by 15. Most carbohydrate-rich foods have about 15 g of carbohydrates per serving. ? For example, if you eat 8 oz (170 g) of strawberries, you will have eaten 2 servings and 30 g of carbohydrates (2 servings x 15 g =  30 g).  For foods that have more than one food mixed, such as soups and casseroles, you must count the carbohydrates in each food that is included. The following list contains standard serving sizes of common carbohydrate-rich foods. Each of these servings has about 15 g of carbohydrates:   hamburger bun or  English muffin.   oz (15 mL) syrup.   oz (14 g) jelly.  1 slice of bread.  1 six-inch tortilla.  3 oz (85 g) cooked rice or pasta.  4 oz (113 g) cooked dried beans.  4 oz (113 g) starchy vegetable, such as peas, corn, or potatoes.  4 oz (113 g) hot cereal.  4 oz (113 g) mashed potatoes or  of a large baked potato.  4 oz (113 g) canned or frozen fruit.  4 oz (120 mL) fruit juice.  4-6 crackers.  6 chicken nuggets.  6 oz (170 g)  unsweetened dry cereal.  6 oz (170 g) plain fat-free yogurt or yogurt sweetened with artificial sweeteners.  8 oz (240 mL) milk.  8 oz (170 g) fresh fruit or one small piece of fruit.  24 oz (680 g) popped popcorn. Example of carbohydrate counting Sample meal  3 oz (85 g) chicken breast.  6 oz (170 g) brown rice.  4 oz (113 g) corn.  8 oz (240 mL) milk.  8 oz (170 g) strawberries with sugar-free whipped topping. Carbohydrate calculation 1. Identify the foods that contain carbohydrates: ? Rice. ? Corn. ? Milk. ? Strawberries. 2. Calculate how many servings you have of each food: ? 2 servings rice. ? 1 serving corn. ? 1 serving milk. ? 1 serving strawberries. 3. Multiply each number of servings by 15 g: ? 2 servings rice x 15 g = 30 g. ? 1 serving corn x 15 g = 15 g. ? 1 serving milk x 15 g = 15 g. ? 1 serving strawberries x 15 g = 15 g. 4. Add together all of the amounts to find the total grams of carbohydrates eaten: ? 30 g + 15 g + 15 g + 15 g = 75 g of carbohydrates total. Summary  Carbohydrate counting is a method of keeping track of how many carbohydrates you eat.  Eating carbohydrates naturally increases the amount of sugar (glucose) in the blood.  Counting how many carbohydrates you eat helps keep your blood glucose within normal limits, which helps you manage your diabetes.  A diet and nutrition specialist (registered dietitian) can help you make a meal plan and calculate how many carbohydrates you should have at each meal and snack. This information is not intended to replace advice given to you by your health care provider. Make sure you discuss any questions you have with your health care provider. Document Released: 12/07/2005 Document Revised: 06/16/2017 Document Reviewed: 05/20/2016 Elsevier Interactive Patient Education  2019 [place], Sinclair Grooms, MD  01/12/2019 9:49 AM    [place]

## 2019-01-11 NOTE — ED Provider Notes (Signed)
[facility]PART[facility] Provider Note   CSN: [id number] Arrival date & time: 01/11/19  1413     History   Chief Complaint Chief Complaint  Patient presents with  . Abscess    HPI Lindsay [name] is a 38 y.o. female.  39 y/o female with a PMH of ARF, NSTEMI, HTN, HLD, cellulitis presents to the ED with a chief complaint of right leg abscess x 2 weeks. Patient reports right leg abscess which and the size of a pencil, which then began to drain clear liquid which then turned into pus.  She reports taking a hot shower and removing the skin which made the abscess drain more pus.  She reports she has try Silvadene cream, peroxide, water, Neosporin along with covering with gauze but reports abscess is worsening and increasing in size.  Was seen by her PCP this morning who advised her to return to the ED for elevation of this abscess along with an x-ray.  She reports some chills, and noncompliance with her medication.  Denies any leg weakness, fevers, shortness of breath or calf swelling or tenderness.     Past Medical History:  Diagnosis Date  . ARF (acute renal failure) ([place]) 04/2015  . Asthma   . Cellulitis of right upper extremity   . Coronary artery disease   . Diabetes mellitus    insulin dependent  . Hyperlipidemia LDL goal <70   . Hypertension   . NSTEMI (non-ST elevated myocardial infarction) ([place]) 08/2016  . Obesity   . S/P angioplasty with stent 08/2016   DES to mLAD and PTCA only to 2nd diag ostium.     Patient Active Problem List   Diagnosis Date Noted  . OSA (obstructive sleep apnea) 11/03/2016  . Coronary artery disease involving native coronary artery of native heart with angina pectoris ([place]) 09/11/2016  . Diabetes mellitus with complication ([place])   . Community acquired pneumonia 09/07/2016  . MI, old   . DM neuropathy, type II diabetes mellitus ([place]) 06/10/2016  . Morbid obesity ([place]) 06/10/2016  . Pressure ulcer 05/17/2015  .  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 ARF (acute renal failure) ([place])   . Septic arthritis ([place])   . Essential hypertension 04/30/2015  . Cellulitis 04/30/2015  . Abscess of right shoulder     Past Surgical History:  Procedure Laterality Date  . CARDIAC CATHETERIZATION N/A 09/07/2016   Procedure: Left Heart Cath and Coronary Angiography;  Surgeon: [name], MD;  Location: [facility];  Service: Cardiovascular;  Laterality: N/A;  . CARDIAC CATHETERIZATION N/A 09/07/2016   Procedure: Coronary Stent Intervention;  Surgeon: [name], MD;  Location: [facility] [facility];  Service: Cardiovascular;  Laterality: N/A;  . CARDIAC CATHETERIZATION N/A 09/07/2016   Procedure: Coronary Balloon Angioplasty;  Surgeon: [name], MD;  Location: [facility];  Service: Cardiovascular;  Laterality: N/A;  . CESAREAN SECTION    . IRRIGATION AND DEBRIDEMENT SHOULDER Right 04/30/2015   Procedure: IRRIGATION AND DEBRIDEMENT SHOULDER;  Surgeon: Renette Butters, MD;  Location: [facility];  Service: Orthopedics;  Laterality: Right;  . IRRIGATION AND DEBRIDEMENT SHOULDER Right 05/01/2015  . LEG SURGERY    . SHOULDER ARTHROSCOPY Right 04/30/2015   Procedure: ARTHROSCOPY SHOULDER;  Surgeon: Renette Butters, MD;  Location: [facility];  Service: Orthopedics;  Laterality: Right;  . TONSILLECTOMY       OB History   No obstetric history on file.      Home  Medications    Prior to Admission medications   Medication Sig Start Date End Date Taking? Authorizing Provider  albuterol (PROVENTIL HFA) 108 (90 Base) MCG/ACT inhaler INHALE 2 PUFFS BY MOUTH EVERY 6 HOURS AS NEEDED FOR WHEEZING OR SHORTNESS OF BREATH Patient taking differently: Inhale 2 puffs into the lungs every 6 (six) hours as needed for wheezing or shortness of breath.  01/13/18  Yes Clent Demark, PA-C  amLODipine (NORVASC) 10 MG tablet Take 0.5 tablets (5 mg total) by mouth daily for 30 days. 01/11/19 02/10/19 Yes [name], NP  aspirin 81 MG  tablet Take 1 tablet (81 mg total) by mouth daily. 01/13/18  Yes Clent Demark, PA-C  aspirin-acetaminophen-caffeine (EXCEDRIN MIGRAINE) [phone] MG tablet Take 2 tablets by mouth every 6 (six) hours as needed for headache.   Yes [provider]  atorvastatin (LIPITOR) 80 MG tablet Take 1 tablet (80 mg total) by mouth daily at 6 PM. Please call and schedule November/December follow up. [phone] 01/11/19  Yes [name], NP  b complex vitamins tablet Take 1 tablet by mouth daily.   Yes [provider]  diclofenac sodium (VOLTAREN) 1 % GEL Apply 2 g topically 4 (four) times daily. Patient taking differently: Apply 2 g topically 4 (four) times daily as needed (Pain).  10/14/17  Yes Kirsteins, Luanna Salk, MD  diphenhydrAMINE (BENADRYL) 25 MG tablet Take 1 tablet (25 mg total) by mouth every 6 (six) hours as needed. 04/13/17  Yes Clent Demark, PA-C  DULoxetine (CYMBALTA) 30 MG capsule Take 1 capsule (30 mg total) by mouth daily. 01/11/19 04/11/19 Yes [name], NP  empagliflozin (JARDIANCE) 10 MG TABS tablet Take 10 mg by mouth daily with breakfast. 01/11/19 04/11/19 Yes [name], NP  gabapentin (NEURONTIN) 300 MG capsule Take 2 capsules (600 mg total) by mouth 3 (three) times daily. 01/11/19  Yes [name], NP  GARLIC PO Take 1 tablet by mouth 2 (two) times daily.   Yes [provider]  ibuprofen (ADVIL,MOTRIN) 200 MG tablet Take 400 mg by mouth every 6 (six) hours as needed for fever or moderate pain.   Yes [provider]  insulin aspart (NOVOLOG) 100 UNIT/ML injection Inject 2-30 Units into the skin 3 (three) times daily before meals. Sliding scale   Yes [provider]  Insulin Glargine (LANTUS SOLOSTAR) 100 UNIT/ML Solostar Pen Inject 50 Units into the skin at bedtime. 01/11/19 04/11/19 Yes [name], NP  insulin regular human CONCENTRATED (HUMULIN R) 500 UNIT/ML injection INJECT 70 UNITS 30 MINUTES BEFORE EACH  MEAL, Patient taking differently: Inject 70 Units into the skin See admin instructions. [place], 01/11/19  Yes [name], NP  losartan (COZAAR) 50 MG tablet Take 1 tablet (50 mg total) by mouth daily. 01/11/19 04/11/19 Yes [name], NP  Magnesium 250 MG TABS Take 1 tablet by mouth 2 (two) times daily.   Yes [provider]  Melatonin 10 MG TABS Take 3 tablets by mouth at bedtime. 01/11/19  Yes [name], NP  meloxicam (MOBIC) 15 MG tablet TAKE ONE TABLET BY MOUTH ONCE DAILY .MUST  HAVE  OFFICE  VISIT  FOR  REFILLS Patient taking differently: Take 15 mg by mouth daily.  01/11/19  Yes [name], NP  methocarbamol (ROBAXIN) 750 MG tablet Take 1 tablet (750 mg total) by mouth at bedtime. 01/11/19  Yes [name], NP  metoprolol tartrate (LOPRESSOR) 50 MG  tablet Take 1 tablet (50 mg total) by mouth 2 (two) times daily. 01/11/19  Yes [name], NP  Multiple Vitamins-Minerals (MULTIVITAMIN PO) Take 1 tablet by mouth daily.   Yes [provider]  nitroGLYCERIN (NITROSTAT) 0.4 MG SL tablet Place 1 tablet (0.4 mg total) under the tongue every 5 (five) minutes as needed for chest pain. 01/13/18  Yes Clent Demark, PA-C  sodium chloride (OCEAN) 0.65 % SOLN nasal spray Place 1 spray into both nostrils as needed for congestion. 02/19/17  Yes Clent Demark, PA-C  BD VEO INSULIN SYRINGE U/F 31G X 15/64" 1 ML MISC USE  SYRINGE ONCE DAILY 06/07/18   Elayne Snare, MD  Blood Glucose Monitoring Suppl (ACCU-CHEK AVIVA PLUS) w/Device KIT 1 each by Does not apply route as directed. 04/15/18   Elayne Snare, MD  cephALEXin (KEFLEX) 500 MG capsule Take 1 capsule (500 mg total) by mouth 2 (two) times daily for 7 days. 01/11/19 01/18/19  Janeece Fitting, PA-C  glucose blood (ACCU-CHEK AVIVA PLUS) test strip Use as instructed to test blood sugar 4 times daily. 01/11/19   [name], NP  Insulin Pen Needle (B-D UF III MINI PEN NEEDLES) 31G X 5 MM MISC Use  as instructed. Monitor blood glucose levels three times per day 01/11/19   [name], NP  sulfamethoxazole-trimethoprim (BACTRIM DS,SEPTRA DS) 800-160 MG tablet Take 1 tablet by mouth 2 (two) times daily for 10 days. Patient not taking: Reported on 01/11/2019 01/11/19 01/21/19  [name], NP    Family History Family History  Problem Relation Age of Onset  . Diabetes Mother   . Hypertension Mother   . Heart disease Mother   . Diabetes Father   . Heart disease Father   . Stroke Maternal Grandmother   . Cancer Maternal Grandmother     Social History Social History   Tobacco Use  . Smoking status: Never Smoker  . Smokeless tobacco: Never Used  Substance Use Topics  . Alcohol use: Yes    Comment: socially  . Drug use: No     Allergies   Hydrazine yellow [tartrazine]; Lisinopril; and Tylenol [acetaminophen]   Review of Systems Review of Systems  Constitutional: Negative for chills and fever.  HENT: Negative for ear pain and sore throat.   Eyes: Negative for pain and visual disturbance.  Respiratory: Negative for cough and shortness of breath.   Cardiovascular: Negative for chest pain and palpitations.  Gastrointestinal: Negative for abdominal pain and vomiting.  Genitourinary: Negative for dysuria and hematuria.  Musculoskeletal: Negative for arthralgias and back pain.  Skin: Positive for color change and wound. Negative for rash.  Neurological: Negative for seizures and syncope.  All other systems reviewed and are negative.    Physical Exam Updated Vital Signs BP (!) 155/84 (BP Location: Left Wrist)   Pulse 86   Temp 98 F (36.7 C) (Oral)   Resp 18   LMP 12/19/2018   SpO2 100%   Physical Exam Vitals signs and nursing note reviewed.  Constitutional:      General: She is not in acute distress.    Appearance: She is well-developed. She is obese.  HENT:     Head: Normocephalic and atraumatic.     Mouth/Throat:     Pharynx: No oropharyngeal exudate.   Eyes:     Pupils: Pupils are equal, round, and reactive to light.  Neck:     Musculoskeletal: Normal range of motion.  Cardiovascular:     Rate  and Rhythm: Regular rhythm.     Heart sounds: Normal heart sounds.  Pulmonary:     Effort: Pulmonary effort is normal. No respiratory distress.     Breath sounds: Normal breath sounds.  Abdominal:     General: Bowel sounds are normal. There is no distension.     Palpations: Abdomen is soft.     Tenderness: There is no abdominal tenderness.  Musculoskeletal:        General: No tenderness or deformity.     Right lower leg: No edema.     Left lower leg: No edema.  Skin:    General: Skin is warm and dry.     Findings: Erythema and wound present.          Comments: Approximately 2 cm wound with some yellow drainage and surrounding erythema and induration.   Neurological:     Mental Status: She is alert and oriented to person, place, and time.          ED Treatments / Results  Labs (all labs ordered are listed, but only abnormal results are displayed) Labs Reviewed  COMPREHENSIVE METABOLIC PANEL - Abnormal; Notable for the following components:      Result Value   Glucose, Bld 175 (*)    All other components within normal limits  AEROBIC CULTURE (SUPERFICIAL SPECIMEN)  CBC WITH DIFFERENTIAL/PLATELET  LACTIC ACID, PLASMA  LACTIC ACID, PLASMA    EKG None  Radiology Dg Tibia/fibula Right  Result Date: 01/11/2019 CLINICAL DATA:  Medial lower leg abscess for 2 weeks. History of diabetes. Suspicion for osteomyelitis. EXAM: RIGHT TIBIA AND FIBULA - 2 VIEW COMPARISON:  Right foot radiographs 05/05/2018. FINDINGS: The mineralization and alignment are normal. There is no evidence of acute fracture or dislocation. There is no evidence of bone destruction, soft tissue emphysema or foreign body. The soft tissues are diffusely prominent with possible scattered soft tissue calcifications. IMPRESSION: No evidence of foreign body or  osteomyelitis. Nonspecific diffuse prominence of the soft tissues. Electronically Signed   By: Richardean Sale M.D.   On: 01/11/2019 17:00    Procedures Procedures (including critical care time)  Medications Ordered in ED Medications - No data to display   Initial Impression / Assessment and Plan / ED Course  I have reviewed the triage vital signs and the nursing notes.  Pertinent labs & imaging results that were available during my care of the patient were reviewed by me and considered in my medical decision making (see chart for details).    Patient with newly controlled diabetes, presents to the ED with to her right leg, seen by PCP today had a elevated A1c.  Valuation patient reports pain to the area and surrounding erythema along with drainage.  Will obtain wound culture at this time.  BC showed no leukocytosis.  CMP showed an elevated glucose which is consistent with patient's baseline but no electrolyte abnormality.  Lactic acid was within normal limits.  Patient is afebrile, no tachycardia.  She was given a prescription for Bactrim by her PCP which she has not picked up from the pharmacy, at this time will add Keflex for staph coverage.  She is advised to follow up with PCP return to the emergency room if symptoms worsen or she develops a fever.She understands and agrees with management. Return precautions provided.   Final Clinical Impressions(s) / ED Diagnoses   Final diagnoses:  Abscess    ED Discharge Orders         Ordered  cephALEXin (KEFLEX) 500 MG capsule  2 times daily     01/11/19 Davy Pique, [place] 01/11/19 1842    Sherwood Gambler, MD 01/11/19 [phone]

## 2019-01-11 NOTE — Discharge Instructions (Addendum)
I have provided a prescription for a skin antibiotic, Keflex please take 1 tablet twice a day for the next 7 days.  Also need to pick up the prescription for Bactrim which was given to you by your primary care physician.  You experience any fever, chills, worsening symptoms please return to the ED.
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
## 2019-01-12 ENCOUNTER — Encounter: Payer: Self-pay | Admitting: Interventional Cardiology

## 2019-01-12 ENCOUNTER — Ambulatory Visit: Payer: Medicaid Other | Admitting: Interventional Cardiology

## 2019-01-12 VITALS — BP 147/93 | HR 93 | Ht 67.5 in | Wt >= 6400 oz

## 2019-01-12 DIAGNOSIS — I10 Essential (primary) hypertension: Secondary | ICD-10-CM | POA: Diagnosis not present

## 2019-01-12 DIAGNOSIS — I25119 Atherosclerotic heart disease of native coronary artery with unspecified angina pectoris: Secondary | ICD-10-CM

## 2019-01-12 DIAGNOSIS — E118 Type 2 diabetes mellitus with unspecified complications: Secondary | ICD-10-CM

## 2019-01-12 DIAGNOSIS — G4733 Obstructive sleep apnea (adult) (pediatric): Secondary | ICD-10-CM | POA: Diagnosis not present

## 2019-01-12 LAB — CMP14+EGFR
ALT: 30 IU/L (ref 0–32)
AST: 23 IU/L (ref 0–40)
Albumin/Globulin Ratio: 1.4 (ref 1.2–2.2)
Albumin: 4.3 g/dL (ref 3.8–4.8)
Alkaline Phosphatase: 133 IU/L — ABNORMAL HIGH (ref 39–117)
BUN/Creatinine Ratio: 21 (ref 9–23)
BUN: 17 mg/dL (ref 6–20)
Bilirubin Total: <0.2 mg/dL (ref 0.0–1.2)
CALCIUM: 10 mg/dL (ref 8.7–10.2)
CO2: 24 mmol/L (ref 20–29)
CREATININE: 0.8 mg/dL (ref 0.57–1.00)
Chloride: 100 mmol/L (ref 96–106)
GFR, EST AFRICAN AMERICAN: 109 mL/min/{1.73_m2} (ref >59)
GFR, EST NON AFRICAN AMERICAN: 94 mL/min/{1.73_m2} (ref >59)
Globulin, Total: 3.1 g/dL (ref 1.5–4.5)
Glucose: 224 mg/dL — ABNORMAL HIGH (ref 65–99)
Potassium: 4.4 mmol/L (ref 3.5–5.2)
Sodium: 140 mmol/L (ref 134–144)
TOTAL PROTEIN: 7.4 g/dL (ref 6.0–8.5)

## 2019-01-12 LAB — CBC
Hematocrit: 40.1 % (ref 34.0–46.6)
Hemoglobin: 13 g/dL (ref 11.1–15.9)
MCH: 27 pg (ref 26.6–33.0)
MCHC: 32.4 g/dL (ref 31.5–35.7)
MCV: 83 fL (ref 79–97)
PLATELETS: 292 10*3/uL (ref 150–450)
RBC: 4.82 x10E6/uL (ref 3.77–5.28)
RDW: 13 % (ref 11.7–15.4)
WBC: 6.7 10*3/uL (ref 3.4–10.8)

## 2019-01-12 LAB — LIPID PANEL
CHOL/HDL RATIO: 4.2 ratio (ref 0.0–4.4)
Cholesterol, Total: 199 mg/dL (ref 100–199)
HDL: 47 mg/dL (ref >39)
LDL CALC: 117 mg/dL — AB (ref 0–99)
TRIGLYCERIDES: 173 mg/dL — AB (ref 0–149)
VLDL CHOLESTEROL CAL: 35 mg/dL (ref 5–40)

## 2019-01-12 MED ORDER — LOSARTAN POTASSIUM 100 MG PO TABS: 100.0000 mg | ORAL_TABLET | Freq: Every day | ORAL | 3 refills | Status: DC

## 2019-01-12 NOTE — Patient Instructions (Signed)
Medication Instructions:  1) INCREASE Losartan to 100mg  once daily  If you need a refill on your cardiac medications before your next appointment, please call your pharmacy.   Lab work: Your physician recommends that you return for lab work in: 2 weeks (BMET)  If you have labs (blood work) drawn today and your tests are completely normal, you will receive your results only by: Marland Kitchen MyChart Message (if you have MyChart) OR . A paper copy in the mail If you have any lab test that is abnormal or we need to change your treatment, we will call you to review the results.  Testing/Procedures: None  Follow-Up: At [facility], you and your health needs are our priority.  As part of our continuing mission to provide you with exceptional heart care, we have created designated Provider Care Teams.  These Care Teams include your primary Cardiologist (physician) and Advanced Practice Providers (APPs -  Physician Assistants and Nurse Practitioners) who all work together to provide you with the care you need, when you need it. You will need a follow up appointment in 12 months.  Please call our office 2 months in advance to schedule this appointment.  You may see Dr. Katrinka Blazing or one of the following Advanced Practice Providers on your designated Care Team:   Norma Fredrickson, NP Nada Boozer, NP . Georgie Chard, NP  Any Other Special Instructions Will Be Listed Below (If Applicable).  Your provider recommends that you maintain 150 minutes per week of moderate aerobic activity.   Carbohydrate Counting for Diabetes Mellitus, Adult  Carbohydrate counting is a method of keeping track of how many carbohydrates you eat. Eating carbohydrates naturally increases the amount of sugar (glucose) in the blood. Counting how many carbohydrates you eat helps keep your blood glucose within normal limits, which helps you manage your diabetes (diabetes mellitus). It is important to know how many carbohydrates you can safely  have in each meal. This is different for every person. A diet and nutrition specialist (registered dietitian) can help you make a meal plan and calculate how many carbohydrates you should have at each meal and snack. Carbohydrates are found in the following foods:  Grains, such as breads and cereals.  Dried beans and soy products.  Starchy vegetables, such as potatoes, peas, and corn.  Fruit and fruit juices.  Milk and yogurt.  Sweets and snack foods, such as cake, cookies, candy, chips, and soft drinks. How do I count carbohydrates? There are two ways to count carbohydrates in food. You can use either of the methods or a combination of both. Reading "Nutrition Facts" on packaged food The "Nutrition Facts" list is included on the labels of almost all packaged foods and beverages in the [place] It includes:  The serving size.  Information about nutrients in each serving, including the grams (g) of carbohydrate per serving. To use the "Nutrition Facts":  Decide how many servings you will have.  Multiply the number of servings by the number of carbohydrates per serving.  The resulting number is the total amount of carbohydrates that you will be having. Learning standard serving sizes of other foods When you eat carbohydrate foods that are not packaged or do not include "Nutrition Facts" on the label, you need to measure the servings in order to count the amount of carbohydrates:  Measure the foods that you will eat with a food scale or measuring cup, if needed.  Decide how many standard-size servings you will eat.  Multiply the number  of servings by 15. Most carbohydrate-rich foods have about 15 g of carbohydrates per serving. ? For example, if you eat 8 oz (170 g) of strawberries, you will have eaten 2 servings and 30 g of carbohydrates (2 servings x 15 g = 30 g).  For foods that have more than one food mixed, such as soups and casseroles, you must count the carbohydrates in each  food that is included. The following list contains standard serving sizes of common carbohydrate-rich foods. Each of these servings has about 15 g of carbohydrates:   hamburger bun or  English muffin.   oz (15 mL) syrup.   oz (14 g) jelly.  1 slice of bread.  1 six-inch tortilla.  3 oz (85 g) cooked rice or pasta.  4 oz (113 g) cooked dried beans.  4 oz (113 g) starchy vegetable, such as peas, corn, or potatoes.  4 oz (113 g) hot cereal.  4 oz (113 g) mashed potatoes or  of a large baked potato.  4 oz (113 g) canned or frozen fruit.  4 oz (120 mL) fruit juice.  4-6 crackers.  6 chicken nuggets.  6 oz (170 g) unsweetened dry cereal.  6 oz (170 g) plain fat-free yogurt or yogurt sweetened with artificial sweeteners.  8 oz (240 mL) milk.  8 oz (170 g) fresh fruit or one small piece of fruit.  24 oz (680 g) popped popcorn. Example of carbohydrate counting Sample meal  3 oz (85 g) chicken breast.  6 oz (170 g) brown rice.  4 oz (113 g) corn.  8 oz (240 mL) milk.  8 oz (170 g) strawberries with sugar-free whipped topping. Carbohydrate calculation 1. Identify the foods that contain carbohydrates: ? Rice. ? Corn. ? Milk. ? Strawberries. 2. Calculate how many servings you have of each food: ? 2 servings rice. ? 1 serving corn. ? 1 serving milk. ? 1 serving strawberries. 3. Multiply each number of servings by 15 g: ? 2 servings rice x 15 g = 30 g. ? 1 serving corn x 15 g = 15 g. ? 1 serving milk x 15 g = 15 g. ? 1 serving strawberries x 15 g = 15 g. 4. Add together all of the amounts to find the total grams of carbohydrates eaten: ? 30 g + 15 g + 15 g + 15 g = 75 g of carbohydrates total. Summary  Carbohydrate counting is a method of keeping track of how many carbohydrates you eat.  Eating carbohydrates naturally increases the amount of sugar (glucose) in the blood.  Counting how many carbohydrates you eat helps keep your blood glucose within  normal limits, which helps you manage your diabetes.  A diet and nutrition specialist (registered dietitian) can help you make a meal plan and calculate how many carbohydrates you should have at each meal and snack. This information is not intended to replace advice given to you by your health care provider. Make sure you discuss any questions you have with your health care provider. Document Released: 12/07/2005 Document Revised: 06/16/2017 Document Reviewed: 05/20/2016 Elsevier Interactive Patient Education  2019 [employer].

## 2019-01-14 LAB — AEROBIC CULTURE W GRAM STAIN (SUPERFICIAL SPECIMEN)

## 2019-01-15 ENCOUNTER — Telehealth: Payer: Self-pay | Admitting: Emergency Medicine

## 2019-01-15 NOTE — Telephone Encounter (Signed)
Post ED Visit - Positive Culture Follow-up  Culture report reviewed by antimicrobial stewardship pharmacist:  []  Enzo Bi, Pharm.D. []  Celedonio Miyamoto, Pharm.D., BCPS AQ-ID []  Garvin Fila, Pharm.D., BCPS []  Georgina Pillion, Pharm.D., BCPS []  [place], [address].D., BCPS, AAHIVP []  Estella Husk, Pharm.D., BCPS, AAHIVP []  Lysle Pearl, PharmD, BCPS []  Phillips Climes, PharmD, BCPS [x]  Agapito Games, PharmD, BCPS []  Verlan Friends, PharmD  Positive aerobic culture Treated with Cephalexin, organism sensitive to the same and no further patient follow-up is required at this time.  Lindsay [name] 01/15/2019, 3:04 PM

## 2019-01-23 ENCOUNTER — Ambulatory Visit (INDEPENDENT_AMBULATORY_CARE_PROVIDER_SITE_OTHER): Payer: Medicaid Other | Admitting: Internal Medicine

## 2019-01-23 ENCOUNTER — Encounter (INDEPENDENT_AMBULATORY_CARE_PROVIDER_SITE_OTHER): Payer: Self-pay | Admitting: Internal Medicine

## 2019-01-23 VITALS — BP 131/81 | HR 84 | Temp 98.1°F | Ht 67.5 in | Wt 397.0 lb

## 2019-01-23 DIAGNOSIS — E118 Type 2 diabetes mellitus with unspecified complications: Secondary | ICD-10-CM

## 2019-01-23 DIAGNOSIS — L97912 Non-pressure chronic ulcer of unspecified part of right lower leg with fat layer exposed: Secondary | ICD-10-CM | POA: Diagnosis not present

## 2019-01-23 DIAGNOSIS — L02415 Cutaneous abscess of right lower limb: Secondary | ICD-10-CM | POA: Diagnosis not present

## 2019-01-23 LAB — POCT CBG (FASTING - GLUCOSE)-MANUAL ENTRY: Glucose Fasting, POC: 159 mg/dL — AB (ref 70–99)

## 2019-01-23 MED ORDER — SULFAMETHOXAZOLE-TRIMETHOPRIM 400-80 MG PO TABS: 1.0000 | ORAL_TABLET | Freq: Two times a day (BID) | ORAL | 0 refills | Status: DC

## 2019-01-23 MED ORDER — MUPIROCIN CALCIUM 2 % EX CREA: TOPICAL_CREAM | CUTANEOUS | 0 refills | Status: DC

## 2019-01-23 NOTE — Progress Notes (Signed)
Patient ID: Lindsay [name], female    DOB: [birth year]  MRN: [id number]  CC: Follow-up (wound check)   Subjective: Lindsay [name] is a 38 y.o. female who presents for wound check.  Patient seen at [facility] clinic. Her concerns today include:  Patient with history of HTN, diabetes type 2 with neuropathy, HL, CAD with PCI and DES x 2, morbid obesity, Insomnia and chronic lower back pain,   Patient presents today as follow-up for abscess on the right lower leg.  She was seen 01/11/2019 and was sent to the emergency room.  Patient had x-ray done on the leg that did not reveal any signs of osteomyelitis.  I&D was not done.  Patient placed on Bactrim and Keflex.  She has finished the Keflex and has 1 day left of a 10-day course of Bactrim.  She has been doing dressing changes with 2 x 2 gauze and a nonstick dressing.  She reports that the drainage has decreased but the size of the wound is about the same.  She reports a decreased swelling and pain.  She has tried to keep the leg elevated.  DM: Restarted on her medications on last visit.  She is taking her insulins different than what is on her med list.  Lantus 35 units BID, and Humulin R 500 30 units with meals and 70 units at bedtime.  She was referred to endocrinologist and has an appointment in [place] but is not sure of the date.  She states that she just receive the letter today in the mail. Checking BS TID. Does not have log with her.  Highest was 235 and highest 116.  She has cut back on eating cookies, other sweets and a lot of the starches.   Patient Active Problem List   Diagnosis Date Noted  . OSA (obstructive sleep apnea) 11/03/2016  . Coronary artery disease involving native coronary artery of native heart with angina pectoris ([place]) 09/11/2016  . Diabetes mellitus with complication ([place])   . Community acquired pneumonia 09/07/2016  . MI, old   . DM neuropathy, type II diabetes mellitus ([place]) 06/10/2016  . Morbid obesity  ([place]) 06/10/2016  . Pressure ulcer 05/17/2015  . 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 ARF (acute renal failure) ([place])   . Septic arthritis ([place])   . Essential hypertension 04/30/2015  . Cellulitis 04/30/2015  . Abscess of right shoulder      Current Outpatient Medications on File Prior to Visit  Medication Sig Dispense Refill  . albuterol (PROVENTIL HFA) 108 (90 Base) MCG/ACT inhaler INHALE 2 PUFFS BY MOUTH EVERY 6 HOURS AS NEEDED FOR WHEEZING OR SHORTNESS OF BREATH (Patient taking differently: Inhale 2 puffs into the lungs every 6 (six) hours as needed for wheezing or shortness of breath. ) 1 each 11  . amLODipine (NORVASC) 10 MG tablet Take 0.5 tablets (5 mg total) by mouth daily for 30 days. 30 tablet 1  . aspirin 81 MG tablet Take 1 tablet (81 mg total) by mouth daily. 30 tablet 11  . atorvastatin (LIPITOR) 80 MG tablet Take 1 tablet (80 mg total) by mouth daily at 6 PM. Please call and schedule November/December follow up. [phone] 30 tablet 11  . DULoxetine (CYMBALTA) 30 MG capsule Take 1 capsule (30 mg total) by mouth daily. 90 capsule 0  . empagliflozin (JARDIANCE) 10 MG TABS tablet Take 10 mg by mouth daily with breakfast. 90 tablet 0  . gabapentin (NEURONTIN) 300 MG capsule Take 2 capsules (600 mg  total) by mouth 3 (three) times daily. 180 capsule 0  . ibuprofen (ADVIL,MOTRIN) 200 MG tablet Take 400 mg by mouth every 6 (six) hours as needed for fever or moderate pain.    . Insulin Glargine (LANTUS SOLOSTAR) 100 UNIT/ML Solostar Pen Inject 50 Units into the skin at bedtime. 45 mL 2  . Insulin Pen Needle (B-D UF III MINI PEN NEEDLES) 31G X 5 MM MISC Use as instructed. Monitor blood glucose levels three times per day 90 each 1  . insulin regular human CONCENTRATED (HUMULIN R) 500 UNIT/ML injection INJECT 70 UNITS 30 MINUTES BEFORE EACH MEAL, (Patient taking differently: Inject 70 Units into the skin See admin instructions. 30 MINUTES BEFORE EACH MEAL,) 20 mL 0  . losartan (COZAAR) 100 MG  tablet Take 1 tablet (100 mg total) by mouth daily. 90 tablet 3  . Magnesium 250 MG TABS Take 1 tablet by mouth 2 (two) times daily.    . meloxicam (MOBIC) 15 MG tablet TAKE ONE TABLET BY MOUTH ONCE DAILY .MUST  HAVE  OFFICE  VISIT  FOR  REFILLS (Patient taking differently: Take 15 mg by mouth daily. ) 30 tablet 1  . methocarbamol (ROBAXIN) 750 MG tablet Take 1 tablet (750 mg total) by mouth at bedtime. 30 tablet 11  . metoprolol tartrate (LOPRESSOR) 50 MG tablet Take 1 tablet (50 mg total) by mouth 2 (two) times daily. 60 tablet 11  . Multiple Vitamins-Minerals (MULTIVITAMIN PO) Take 1 tablet by mouth daily.    Marland Kitchen aspirin-acetaminophen-caffeine (EXCEDRIN MIGRAINE) 250-250-65 MG tablet Take 2 tablets by mouth every 6 (six) hours as needed for headache.    . b complex vitamins tablet Take 1 tablet by mouth daily.    . BD VEO INSULIN SYRINGE U/F 31G X 15/64" 1 ML MISC USE  SYRINGE ONCE DAILY 100 each 1  . Blood Glucose Monitoring Suppl (ACCU-CHEK AVIVA PLUS) w/Device KIT 1 each by Does not apply route as directed. 1 kit 0  . diclofenac sodium (VOLTAREN) 1 % GEL Apply 2 g topically 4 (four) times daily. (Patient taking differently: Apply 2 g topically 4 (four) times daily as needed (Pain). ) 300 g 1  . diphenhydrAMINE (BENADRYL) 25 MG tablet Take 1 tablet (25 mg total) by mouth every 6 (six) hours as needed. 30 tablet 0  . GARLIC PO Take 1 tablet by mouth 2 (two) times daily.    Marland Kitchen glucose blood (ACCU-CHEK AVIVA PLUS) test strip Use as instructed to test blood sugar 4 times daily. 100 each 12  . Melatonin 10 MG TABS Take 3 tablets by mouth at bedtime. 30 tablet 2  . nitroGLYCERIN (NITROSTAT) 0.4 MG SL tablet Place 1 tablet (0.4 mg total) under the tongue every 5 (five) minutes as needed for chest pain. 25 tablet 11   No current facility-administered medications on file prior to visit.     Allergies  Allergen Reactions  . Hydrazine Yellow [Tartrazine] Shortness Of Breath and Swelling    Swelling  mostly noticed in legs and feet, retaining urination, shortness of breaht, and minor chest pain  . Lisinopril Shortness Of Breath    Was on prinzide; had sob/chest pain on it.  . Tylenol [Acetaminophen] Itching and Swelling    Itching of the mouth, swelling of tongue and stomach started hurting    Social History   Socioeconomic History  . Marital status: Single    Spouse name: Not on file  . Number of children: Not on file  . Years of education:  Not on file  . Highest education level: Not on file  Occupational History  . Not on file  Social Needs  . Financial resource strain: Not on file  . Food insecurity:    Worry: Not on file    Inability: Not on file  . Transportation needs:    Medical: Not on file    Non-medical: Not on file  Tobacco Use  . Smoking status: Never Smoker  . Smokeless tobacco: Never Used  Substance and Sexual Activity  . Alcohol use: Yes    Comment: socially  . Drug use: No  . Sexual activity: Not on file  Lifestyle  . Physical activity:    Days per week: Not on file    Minutes per session: Not on file  . Stress: Not on file  Relationships  . Social connections:    Talks on phone: Not on file    Gets together: Not on file    Attends religious service: Not on file    Active member of club or organization: Not on file    Attends meetings of clubs or organizations: Not on file    Relationship status: Not on file  . Intimate partner violence:    Fear of current or ex partner: Not on file    Emotionally abused: Not on file    Physically abused: Not on file    Forced sexual activity: Not on file  Other Topics Concern  . Not on file  Social History Narrative  . Not on file    Family History  Problem Relation Age of Onset  . Diabetes Mother   . Hypertension Mother   . Heart disease Mother   . Diabetes Father   . Heart disease Father   . Stroke Maternal Grandmother   . Cancer Maternal Grandmother     Past Surgical History:  Procedure  Laterality Date  . CARDIAC CATHETERIZATION N/A 09/07/2016   Procedure: Left Heart Cath and Coronary Angiography;  Surgeon: [name], MD;  Location: [facility];  Service: Cardiovascular;  Laterality: N/A;  . CARDIAC CATHETERIZATION N/A 09/07/2016   Procedure: Coronary Stent Intervention;  Surgeon: [name], MD;  Location: [facility];  Service: Cardiovascular;  Laterality: N/A;  . CARDIAC CATHETERIZATION N/A 09/07/2016   Procedure: Coronary Balloon Angioplasty;  Surgeon: [name], MD;  Location: [facility];  Service: Cardiovascular;  Laterality: N/A;  . CESAREAN SECTION    . IRRIGATION AND DEBRIDEMENT SHOULDER Right 04/30/2015   Procedure: IRRIGATION AND DEBRIDEMENT SHOULDER;  Surgeon: Renette Butters, MD;  Location: [facility];  Service: Orthopedics;  Laterality: Right;  . IRRIGATION AND DEBRIDEMENT SHOULDER Right 05/01/2015  . LEG SURGERY    . SHOULDER ARTHROSCOPY Right 04/30/2015   Procedure: ARTHROSCOPY SHOULDER;  Surgeon: Renette Butters, MD;  Location: [facility];  Service: Orthopedics;  Laterality: Right;  . TONSILLECTOMY      ROS: Review of Systems Neg except as above  PHYSICAL EXAM: BP 131/81 (BP Location: Left Arm, Patient Position: Sitting, Cuff Size: Large)   Pulse 84   Temp 98.1 F (36.7 C) (Oral)   Ht 5' 7.5" (1.715 m)   Wt (!) 397 lb (180.1 kg)   SpO2 93%   BMI 61.26 kg/m   Physical Exam  General appearance - alert, well appearing, and in no distress Mental status - normal mood, behavior, speech, dress, motor activity, and thought processes Skin - Size 2.5 x 2 cm ulcer medial aspect of  RT lower extremity; no pus expressed but does have some surrounding induration, no fluctuance Dorsalis pedis and posterior tibialis pulses are 3+ bilaterally     Lab Results  Component Value Date   HGBA1C 10.1 (A) 01/11/2019   Results for orders placed or performed in visit on 01/23/19  Glucose (CBG), Fasting  Result Value Ref Range   Glucose  Fasting, POC 159 (A) 70 - 99 mg/dL    ASSESSMENT AND PLAN:  1. Abscess of right lower leg Patient advised to keep the leg elevated.  Will give an additional 4 days of Bactrim for total of 2 weeks.  Will refer to wound care clinic.  Message sent to referral coordinator to see if we can get her schedule as soon as possible.- sulfamethoxazole-trimethoprim (BACTRIM) 400-80 MG tablet; Take 1 tablet by mouth 2 (two) times daily.  Dispense: 8 tablet; Refill: 0  2. Ulcer of right lower extremity with fat layer exposed ([place]) - Ambulatory referral to Wound Clinic - mupirocin cream (BACTROBAN) 2 %; Apply to affected area daily  Dispense: 15 g; Refill: 0  3. Diabetes mellitus with complication [facility]) Patient to keep appointment with endocrinologist.  I advised against taking regular insulin at bedtime.  Should be taken only with meals. - Glucose (CBG), Fasting   Patient was given the opportunity to ask questions.  Patient verbalized understanding of the plan and was able to repeat key elements of the plan.   Orders Placed This Encounter  Procedures  . Ambulatory referral to Wound Clinic  . Glucose (CBG), Fasting     Requested Prescriptions   Signed Prescriptions Disp Refills  . mupirocin cream (BACTROBAN) 2 % 15 g 0    Sig: Apply to affected area daily  . sulfamethoxazole-trimethoprim (BACTRIM) 400-80 MG tablet 8 tablet 0    Sig: Take 1 tablet by mouth 2 (two) times daily.    Return in about 1 week (around 01/30/2019) for ulcer recheck.  Karle Plumber, MD, FACP

## 2019-01-23 NOTE — Patient Instructions (Signed)
Please keep the right leg elevated as much as possible.  This will help with healing. I have extended the Bactrim antibiotics for 4 more days. I have referred you to the wound care clinic. Continue dressing changes once to twice a day.   Please keep the appointment with the endocrinologist.

## 2019-01-24 ENCOUNTER — Telehealth (INDEPENDENT_AMBULATORY_CARE_PROVIDER_SITE_OTHER): Payer: Self-pay

## 2019-01-24 ENCOUNTER — Other Ambulatory Visit: Payer: Self-pay | Admitting: Family Medicine

## 2019-01-24 DIAGNOSIS — T24232A Burn of second degree of left lower leg, initial encounter: Secondary | ICD-10-CM

## 2019-01-24 MED ORDER — MUPIROCIN 2 % EX OINT: TOPICAL_OINTMENT | CUTANEOUS | 1 refills | Status: DC

## 2019-01-24 NOTE — Telephone Encounter (Signed)
-----   Message from [name], NP sent at 01/23/2019  8:35 PM EST ----- Labs are essentially normal except for your cholesterol levels. Please make sure you take your atorvastatin 80mg  daily. You should also avoid red meat/beef, fried foods. junk foods, sodas, sugary drinks, unhealthy snacking.  Drink at least 48oz of water per day and exercise at least 30 minutes 3-5 times per week.

## 2019-01-24 NOTE — Progress Notes (Signed)
Patient ID: Lindsay [name], female   DOB: [birth year], 38 y.o.   MRN: [id number]   Message received from patient's pharmacy that Bactroban needs to be changed to prescription for ointment rather than cream as patient's insurance will not cover the cream.  New prescription will be sent to patient's pharmacy.

## 2019-01-24 NOTE — Telephone Encounter (Signed)
Patient is aware that labs are essentially normal other than cholesterol levels. Advised patient to make sure she is taking atorvastatin 80 mg daily. Patient also advised to avoid red meat/beef, fried foods, junk foods. Sodas, sugary drinks and unhealthy snacking. Patient advised to drink at least 48 oz of water daily and exercise for at least 30 minutes 3-5 times a week. Maryjean Morn, CMA

## 2019-01-26 ENCOUNTER — Other Ambulatory Visit: Payer: Medicaid Other

## 2019-01-31 ENCOUNTER — Other Ambulatory Visit: Payer: Medicaid Other

## 2019-01-31 DIAGNOSIS — I10 Essential (primary) hypertension: Secondary | ICD-10-CM | POA: Diagnosis not present

## 2019-01-31 DIAGNOSIS — I25119 Atherosclerotic heart disease of native coronary artery with unspecified angina pectoris: Secondary | ICD-10-CM

## 2019-01-31 DIAGNOSIS — E118 Type 2 diabetes mellitus with unspecified complications: Secondary | ICD-10-CM

## 2019-02-01 LAB — BASIC METABOLIC PANEL
BUN/Creatinine Ratio: 19 (ref 9–23)
BUN: 15 mg/dL (ref 6–20)
CO2: 22 mmol/L (ref 20–29)
Calcium: 9.6 mg/dL (ref 8.7–10.2)
Chloride: 100 mmol/L (ref 96–106)
Creatinine, Ser: 0.81 mg/dL (ref 0.57–1.00)
GFR calc Af Amer: 107 mL/min/{1.73_m2} (ref >59)
GFR calc non Af Amer: 93 mL/min/{1.73_m2} (ref >59)
Glucose: 198 mg/dL — ABNORMAL HIGH (ref 65–99)
Potassium: 4.8 mmol/L (ref 3.5–5.2)
Sodium: 139 mmol/L (ref 134–144)

## 2019-02-07 ENCOUNTER — Ambulatory Visit: Payer: Medicaid Other | Admitting: Podiatry

## 2019-02-08 ENCOUNTER — Encounter (HOSPITAL_BASED_OUTPATIENT_CLINIC_OR_DEPARTMENT_OTHER): Payer: Medicaid Other | Attending: Internal Medicine

## 2019-02-13 ENCOUNTER — Encounter

## 2019-02-13 ENCOUNTER — Ambulatory Visit (INDEPENDENT_AMBULATORY_CARE_PROVIDER_SITE_OTHER): Payer: Medicaid Other | Admitting: Primary Care

## 2019-02-16 ENCOUNTER — Other Ambulatory Visit (INDEPENDENT_AMBULATORY_CARE_PROVIDER_SITE_OTHER): Payer: Self-pay | Admitting: Nurse Practitioner

## 2019-02-16 DIAGNOSIS — E1165 Type 2 diabetes mellitus with hyperglycemia: Secondary | ICD-10-CM

## 2019-02-16 DIAGNOSIS — E118 Type 2 diabetes mellitus with unspecified complications: Principal | ICD-10-CM

## 2019-02-20 ENCOUNTER — Encounter (HOSPITAL_BASED_OUTPATIENT_CLINIC_OR_DEPARTMENT_OTHER): Payer: Medicaid Other | Attending: Internal Medicine

## 2019-02-20 DIAGNOSIS — B951 Streptococcus, group B, as the cause of diseases classified elsewhere: Secondary | ICD-10-CM | POA: Diagnosis not present

## 2019-02-20 DIAGNOSIS — G629 Polyneuropathy, unspecified: Secondary | ICD-10-CM | POA: Diagnosis not present

## 2019-02-20 DIAGNOSIS — I2510 Atherosclerotic heart disease of native coronary artery without angina pectoris: Secondary | ICD-10-CM | POA: Diagnosis not present

## 2019-02-20 DIAGNOSIS — I110 Hypertensive heart disease with heart failure: Secondary | ICD-10-CM | POA: Insufficient documentation

## 2019-02-20 DIAGNOSIS — E11622 Type 2 diabetes mellitus with other skin ulcer: Secondary | ICD-10-CM | POA: Insufficient documentation

## 2019-02-20 DIAGNOSIS — L97212 Non-pressure chronic ulcer of right calf with fat layer exposed: Secondary | ICD-10-CM | POA: Diagnosis not present

## 2019-02-20 DIAGNOSIS — I509 Heart failure, unspecified: Secondary | ICD-10-CM | POA: Diagnosis not present

## 2019-02-20 DIAGNOSIS — Z955 Presence of coronary angioplasty implant and graft: Secondary | ICD-10-CM | POA: Diagnosis not present

## 2019-02-20 DIAGNOSIS — L97811 Non-pressure chronic ulcer of other part of right lower leg limited to breakdown of skin: Secondary | ICD-10-CM | POA: Diagnosis not present

## 2019-02-20 DIAGNOSIS — I252 Old myocardial infarction: Secondary | ICD-10-CM | POA: Insufficient documentation

## 2019-02-20 DIAGNOSIS — L02415 Cutaneous abscess of right lower limb: Secondary | ICD-10-CM | POA: Diagnosis not present

## 2019-02-20 DIAGNOSIS — Z6841 Body Mass Index (BMI) 40.0 and over, adult: Secondary | ICD-10-CM | POA: Diagnosis not present

## 2019-02-20 DIAGNOSIS — E1142 Type 2 diabetes mellitus with diabetic polyneuropathy: Secondary | ICD-10-CM | POA: Diagnosis not present

## 2019-02-27 DIAGNOSIS — L97212 Non-pressure chronic ulcer of right calf with fat layer exposed: Secondary | ICD-10-CM | POA: Diagnosis not present

## 2019-02-27 DIAGNOSIS — I890 Lymphedema, not elsewhere classified: Secondary | ICD-10-CM | POA: Diagnosis not present

## 2019-02-27 DIAGNOSIS — I2510 Atherosclerotic heart disease of native coronary artery without angina pectoris: Secondary | ICD-10-CM | POA: Diagnosis not present

## 2019-02-27 DIAGNOSIS — L02415 Cutaneous abscess of right lower limb: Secondary | ICD-10-CM | POA: Diagnosis not present

## 2019-02-27 DIAGNOSIS — I110 Hypertensive heart disease with heart failure: Secondary | ICD-10-CM | POA: Diagnosis not present

## 2019-02-27 DIAGNOSIS — E1142 Type 2 diabetes mellitus with diabetic polyneuropathy: Secondary | ICD-10-CM | POA: Diagnosis not present

## 2019-02-27 DIAGNOSIS — Z6841 Body Mass Index (BMI) 40.0 and over, adult: Secondary | ICD-10-CM | POA: Diagnosis not present

## 2019-02-27 DIAGNOSIS — L97811 Non-pressure chronic ulcer of other part of right lower leg limited to breakdown of skin: Secondary | ICD-10-CM | POA: Diagnosis not present

## 2019-02-27 DIAGNOSIS — Z955 Presence of coronary angioplasty implant and graft: Secondary | ICD-10-CM | POA: Diagnosis not present

## 2019-02-27 DIAGNOSIS — I509 Heart failure, unspecified: Secondary | ICD-10-CM | POA: Diagnosis not present

## 2019-02-27 DIAGNOSIS — Z7689 Persons encountering health services in other specified circumstances: Secondary | ICD-10-CM | POA: Diagnosis not present

## 2019-02-27 DIAGNOSIS — E11622 Type 2 diabetes mellitus with other skin ulcer: Secondary | ICD-10-CM | POA: Diagnosis not present

## 2019-02-27 DIAGNOSIS — I252 Old myocardial infarction: Secondary | ICD-10-CM | POA: Diagnosis not present

## 2019-03-01 ENCOUNTER — Ambulatory Visit (INDEPENDENT_AMBULATORY_CARE_PROVIDER_SITE_OTHER): Payer: Medicaid Other | Admitting: Primary Care

## 2019-03-02 ENCOUNTER — Ambulatory Visit: Payer: Medicaid Other | Admitting: "Endocrinology

## 2019-03-03 ENCOUNTER — Ambulatory Visit: Payer: Medicaid Other | Admitting: Podiatry

## 2019-03-03 ENCOUNTER — Other Ambulatory Visit: Payer: Self-pay

## 2019-03-03 DIAGNOSIS — Z7689 Persons encountering health services in other specified circumstances: Secondary | ICD-10-CM | POA: Diagnosis not present

## 2019-03-03 DIAGNOSIS — M79675 Pain in left toe(s): Secondary | ICD-10-CM | POA: Diagnosis not present

## 2019-03-03 DIAGNOSIS — B351 Tinea unguium: Secondary | ICD-10-CM | POA: Diagnosis not present

## 2019-03-03 DIAGNOSIS — M79674 Pain in right toe(s): Secondary | ICD-10-CM

## 2019-03-03 DIAGNOSIS — L84 Corns and callosities: Secondary | ICD-10-CM | POA: Diagnosis not present

## 2019-03-03 DIAGNOSIS — E1142 Type 2 diabetes mellitus with diabetic polyneuropathy: Secondary | ICD-10-CM | POA: Diagnosis not present

## 2019-03-03 NOTE — Patient Instructions (Signed)
Diabetes Mellitus and Foot Care Foot care is an important part of your health, especially when you have diabetes. Diabetes may cause you to have problems because of poor blood flow (circulation) to your feet and legs, which can cause your skin to:  Become thinner and drier.  Break more easily.  Heal more slowly.  Peel and crack. You may also have nerve damage (neuropathy) in your legs and feet, causing decreased feeling in them. This means that you may not notice minor injuries to your feet that could lead to more serious problems. Noticing and addressing any potential problems early is the best way to prevent future foot problems. How to care for your feet Foot hygiene  Wash your feet daily with warm water and mild soap. Do not use hot water. Then, pat your feet and the areas between your toes until they are completely dry. Do not soak your feet as this can dry your skin.  Trim your toenails straight across. Do not dig under them or around the cuticle. File the edges of your nails with an emery board or nail file.  Apply a moisturizing lotion or petroleum jelly to the skin on your feet and to dry, brittle toenails. Use lotion that does not contain alcohol and is unscented. Do not apply lotion between your toes. Shoes and socks  Wear clean socks or stockings every day. Make sure they are not too tight. Do not wear knee-high stockings since they may decrease blood flow to your legs.  Wear shoes that fit properly and have enough cushioning. Always look in your shoes before you put them on to be sure there are no objects inside.  To break in new shoes, wear them for just a few hours a day. This prevents injuries on your feet. Wounds, scrapes, corns, and calluses  Check your feet daily for blisters, cuts, bruises, sores, and redness. If you cannot see the bottom of your feet, use a mirror or ask someone for help.  Do not cut corns or calluses or try to remove them with medicine.  If you  find a minor scrape, cut, or break in the skin on your feet, keep it and the skin around it clean and dry. You may clean these areas with mild soap and water. Do not clean the area with peroxide, alcohol, or iodine.  If you have a wound, scrape, corn, or callus on your foot, look at it several times a day to make sure it is healing and not infected. Check for: ? Redness, swelling, or pain. ? Fluid or blood. ? Warmth. ? Pus or a bad smell. General instructions  Do not cross your legs. This may decrease blood flow to your feet.  Do not use heating pads or hot water bottles on your feet. They may burn your skin. If you have lost feeling in your feet or legs, you may not know this is happening until it is too late.  Protect your feet from hot and cold by wearing shoes, such as at the beach or on hot pavement.  Schedule a complete foot exam at least once a year (annually) or more often if you have foot problems. If you have foot problems, report any cuts, sores, or bruises to your health care provider immediately. Contact a health care provider if:  You have a medical condition that increases your risk of infection and you have any cuts, sores, or bruises on your feet.  You have an injury that is not   healing.  You have redness on your legs or feet.  You feel burning or tingling in your legs or feet.  You have pain or cramps in your legs and feet.  Your legs or feet are numb.  Your feet always feel cold.  You have pain around a toenail. Get help right away if:  You have a wound, scrape, corn, or callus on your foot and: ? You have pain, swelling, or redness that gets worse. ? You have fluid or blood coming from the wound, scrape, corn, or callus. ? Your wound, scrape, corn, or callus feels warm to the touch. ? You have pus or a bad smell coming from the wound, scrape, corn, or callus. ? You have a fever. ? You have a red line going up your leg. Summary  Check your feet every day  for cuts, sores, red spots, swelling, and blisters.  Moisturize feet and legs daily.  Wear shoes that fit properly and have enough cushioning.  If you have foot problems, report any cuts, sores, or bruises to your health care provider immediately.  Schedule a complete foot exam at least once a year (annually) or more often if you have foot problems. This information is not intended to replace advice given to you by your health care provider. Make sure you discuss any questions you have with your health care provider. Document Released: 12/04/2000 Document Revised: 01/19/2018 Document Reviewed: 01/08/2017 Elsevier Interactive Patient Education  2019 [employer]  Onychomycosis/Fungal Toenails  WHAT IS IT? An infection that lies within the keratin of your nail plate that is caused by a fungus.  WHY ME? Fungal infections affect all ages, sexes, races, and creeds.  There may be many factors that predispose you to a fungal infection such as age, coexisting medical conditions such as diabetes, or an autoimmune disease; stress, medications, fatigue, genetics, etc.  Bottom line: fungus thrives in a warm, moist environment and your shoes offer such a location.  IS IT CONTAGIOUS? Theoretically, yes.  You do not want to share shoes, nail clippers or files with someone who has fungal toenails.  Walking around barefoot in the same room or sleeping in the same bed is unlikely to transfer the organism.  It is important to realize, however, that fungus can spread easily from one nail to the next on the same foot.  HOW DO WE TREAT THIS?  There are several ways to treat this condition.  Treatment may depend on many factors such as age, medications, pregnancy, liver and kidney conditions, etc.  It is best to ask your doctor which options are available to you.  1. No treatment.   Unlike many other medical concerns, you can live with this condition.  However for many people this can be a painful condition and  may lead to ingrown toenails or a bacterial infection.  It is recommended that you keep the nails cut short to help reduce the amount of fungal nail. 2. Topical treatment.  These range from herbal remedies to prescription strength nail lacquers.  About 40-50% effective, topicals require twice daily application for approximately 9 to 12 months or until an entirely new nail has grown out.  The most effective topicals are medical grade medications available through physicians offices. 3. Oral antifungal medications.  With an 80-90% cure rate, the most common oral medication requires 3 to 4 months of therapy and stays in your system for a year as the new nail grows out.  Oral antifungal medications do require   blood work to make sure it is a safe drug for you.  A liver function panel will be performed prior to starting the medication and after the first month of treatment.  It is important to have the blood work performed to avoid any harmful side effects.  In general, this medication safe but blood work is required. 4. Laser Therapy.  This treatment is performed by applying a specialized laser to the affected nail plate.  This therapy is noninvasive, fast, and non-painful.  It is not covered by insurance and is therefore, out of pocket.  The results have been very good with a 80-95% cure rate.  The [facility] is the only practice in the area to offer this therapy. 5. Permanent Nail Avulsion.  Removing the entire nail so that a new nail will not grow back. 

## 2019-03-06 DIAGNOSIS — Z955 Presence of coronary angioplasty implant and graft: Secondary | ICD-10-CM | POA: Diagnosis not present

## 2019-03-06 DIAGNOSIS — E1142 Type 2 diabetes mellitus with diabetic polyneuropathy: Secondary | ICD-10-CM | POA: Diagnosis not present

## 2019-03-06 DIAGNOSIS — I110 Hypertensive heart disease with heart failure: Secondary | ICD-10-CM | POA: Diagnosis not present

## 2019-03-06 DIAGNOSIS — I2510 Atherosclerotic heart disease of native coronary artery without angina pectoris: Secondary | ICD-10-CM | POA: Diagnosis not present

## 2019-03-06 DIAGNOSIS — Z6841 Body Mass Index (BMI) 40.0 and over, adult: Secondary | ICD-10-CM | POA: Diagnosis not present

## 2019-03-06 DIAGNOSIS — I252 Old myocardial infarction: Secondary | ICD-10-CM | POA: Diagnosis not present

## 2019-03-06 DIAGNOSIS — L97811 Non-pressure chronic ulcer of other part of right lower leg limited to breakdown of skin: Secondary | ICD-10-CM | POA: Diagnosis not present

## 2019-03-06 DIAGNOSIS — E11622 Type 2 diabetes mellitus with other skin ulcer: Secondary | ICD-10-CM | POA: Diagnosis not present

## 2019-03-06 DIAGNOSIS — L02415 Cutaneous abscess of right lower limb: Secondary | ICD-10-CM | POA: Diagnosis not present

## 2019-03-06 DIAGNOSIS — I509 Heart failure, unspecified: Secondary | ICD-10-CM | POA: Diagnosis not present

## 2019-03-11 ENCOUNTER — Other Ambulatory Visit (INDEPENDENT_AMBULATORY_CARE_PROVIDER_SITE_OTHER): Payer: Self-pay | Admitting: Nurse Practitioner

## 2019-03-11 DIAGNOSIS — Z794 Long term (current) use of insulin: Principal | ICD-10-CM

## 2019-03-11 DIAGNOSIS — E1140 Type 2 diabetes mellitus with diabetic neuropathy, unspecified: Secondary | ICD-10-CM

## 2019-03-13 ENCOUNTER — Other Ambulatory Visit: Payer: Self-pay | Admitting: Primary Care

## 2019-03-13 ENCOUNTER — Encounter: Payer: Self-pay | Admitting: Podiatry

## 2019-03-13 DIAGNOSIS — I509 Heart failure, unspecified: Secondary | ICD-10-CM | POA: Diagnosis not present

## 2019-03-13 DIAGNOSIS — Z6841 Body Mass Index (BMI) 40.0 and over, adult: Secondary | ICD-10-CM | POA: Diagnosis not present

## 2019-03-13 DIAGNOSIS — E1140 Type 2 diabetes mellitus with diabetic neuropathy, unspecified: Secondary | ICD-10-CM

## 2019-03-13 DIAGNOSIS — Z794 Long term (current) use of insulin: Principal | ICD-10-CM

## 2019-03-13 DIAGNOSIS — I110 Hypertensive heart disease with heart failure: Secondary | ICD-10-CM | POA: Diagnosis not present

## 2019-03-13 DIAGNOSIS — L02415 Cutaneous abscess of right lower limb: Secondary | ICD-10-CM | POA: Diagnosis not present

## 2019-03-13 DIAGNOSIS — L97811 Non-pressure chronic ulcer of other part of right lower leg limited to breakdown of skin: Secondary | ICD-10-CM | POA: Diagnosis not present

## 2019-03-13 DIAGNOSIS — Z955 Presence of coronary angioplasty implant and graft: Secondary | ICD-10-CM | POA: Diagnosis not present

## 2019-03-13 DIAGNOSIS — Z7689 Persons encountering health services in other specified circumstances: Secondary | ICD-10-CM | POA: Diagnosis not present

## 2019-03-13 DIAGNOSIS — E11622 Type 2 diabetes mellitus with other skin ulcer: Secondary | ICD-10-CM | POA: Diagnosis not present

## 2019-03-13 DIAGNOSIS — I2510 Atherosclerotic heart disease of native coronary artery without angina pectoris: Secondary | ICD-10-CM | POA: Diagnosis not present

## 2019-03-13 DIAGNOSIS — E1142 Type 2 diabetes mellitus with diabetic polyneuropathy: Secondary | ICD-10-CM | POA: Diagnosis not present

## 2019-03-13 DIAGNOSIS — L97812 Non-pressure chronic ulcer of other part of right lower leg with fat layer exposed: Secondary | ICD-10-CM | POA: Diagnosis not present

## 2019-03-13 DIAGNOSIS — I252 Old myocardial infarction: Secondary | ICD-10-CM | POA: Diagnosis not present

## 2019-03-13 DIAGNOSIS — I890 Lymphedema, not elsewhere classified: Secondary | ICD-10-CM | POA: Diagnosis not present

## 2019-03-13 MED ORDER — GABAPENTIN 300 MG PO CAPS: 600.0000 mg | ORAL_CAPSULE | Freq: Three times a day (TID) | ORAL | 1 refills | Status: DC

## 2019-03-13 NOTE — Telephone Encounter (Signed)
Last prescribed by Zelda. Due to circumstance refilled unsure if she will be seen on scheduled appt. Note previously taken without any adverse affects.

## 2019-03-13 NOTE — Progress Notes (Signed)
Subjective: Lindsay [name] presents today for diabetic foot care with painful, thick toenails 1-5 b/l that she cannot cut and which interfere with daily activities.  Pain is aggravated when wearing enclosed shoe gear.  Clent Demark, PA-C is her PCP.   She is currently being seen at [facility] for abcess/wound right lower leg.She states it is nearly healed.  She is asking for another vendor for her diabetic shoes.   Current Outpatient Medications:  .  albuterol (PROVENTIL HFA) 108 (90 Base) MCG/ACT inhaler, INHALE 2 PUFFS BY MOUTH EVERY 6 HOURS AS NEEDED FOR WHEEZING OR SHORTNESS OF BREATH (Patient taking differently: Inhale 2 puffs into the lungs every 6 (six) hours as needed for wheezing or shortness of breath. ), Disp: 1 each, Rfl: 11 .  amLODipine (NORVASC) 10 MG tablet, Take 0.5 tablets (5 mg total) by mouth daily for 30 days., Disp: 30 tablet, Rfl: 1 .  aspirin 81 MG tablet, Take 1 tablet (81 mg total) by mouth daily., Disp: 30 tablet, Rfl: 11 .  aspirin-acetaminophen-caffeine (EXCEDRIN MIGRAINE) 250-250-65 MG tablet, Take 2 tablets by mouth every 6 (six) hours as needed for headache., Disp: , Rfl:  .  atorvastatin (LIPITOR) 80 MG tablet, Take 1 tablet (80 mg total) by mouth daily at 6 PM. Please call and schedule November/December follow up. [phone], Disp: 30 tablet, Rfl: 11 .  b complex vitamins tablet, Take 1 tablet by mouth daily., Disp: , Rfl:  .  BD VEO INSULIN SYRINGE U/F 31G X 15/64" 1 ML MISC, USE  SYRINGE ONCE DAILY, Disp: 100 each, Rfl: 1 .  Blood Glucose Monitoring Suppl (ACCU-CHEK AVIVA PLUS) w/Device KIT, 1 each by Does not apply route as directed., Disp: 1 kit, Rfl: 0 .  diclofenac sodium (VOLTAREN) 1 % GEL, Apply 2 g topically 4 (four) times daily. (Patient taking differently: Apply 2 g topically 4 (four) times daily as needed (Pain). ), Disp: 300 g, Rfl: 1 .  diphenhydrAMINE (BENADRYL) 25 MG tablet, Take 1 tablet (25 mg total) by mouth every 6 (six) hours as  needed., Disp: 30 tablet, Rfl: 0 .  DULoxetine (CYMBALTA) 30 MG capsule, Take 1 capsule (30 mg total) by mouth daily., Disp: 90 capsule, Rfl: 0 .  empagliflozin (JARDIANCE) 10 MG TABS tablet, Take 10 mg by mouth daily with breakfast., Disp: 90 tablet, Rfl: 0 .  gabapentin (NEURONTIN) 300 MG capsule, Take 2 capsules (600 mg total) by mouth 3 (three) times daily., Disp: 180 capsule, Rfl: 0 .  GARLIC PO, Take 1 tablet by mouth 2 (two) times daily., Disp: , Rfl:  .  glucose blood (ACCU-CHEK AVIVA PLUS) test strip, Use as instructed to test blood sugar 4 times daily., Disp: 100 each, Rfl: 12 .  ibuprofen (ADVIL,MOTRIN) 200 MG tablet, Take 400 mg by mouth every 6 (six) hours as needed for fever or moderate pain., Disp: , Rfl:  .  Insulin Glargine (LANTUS SOLOSTAR) 100 UNIT/ML Solostar Pen, Inject 50 Units into the skin at bedtime., Disp: 45 mL, Rfl: 2 .  Insulin Pen Needle (B-D UF III MINI PEN NEEDLES) 31G X 5 MM MISC, Use as instructed. Monitor blood glucose levels three times per day, Disp: 90 each, Rfl: 1 .  insulin regular human CONCENTRATED (HUMULIN R) 500 UNIT/ML injection, INJECT 70 UNITS 30 MINUTES BEFORE EACH MEAL, Disp: 20 mL, Rfl: 0 .  losartan (COZAAR) 100 MG tablet, Take 1 tablet (100 mg total) by mouth daily., Disp: 90 tablet, Rfl: 3 .  Magnesium 250 MG  TABS, Take 1 tablet by mouth 2 (two) times daily., Disp: , Rfl:  .  Melatonin 10 MG TABS, Take 3 tablets by mouth at bedtime., Disp: 30 tablet, Rfl: 2 .  meloxicam (MOBIC) 15 MG tablet, TAKE ONE TABLET BY MOUTH ONCE DAILY .MUST  HAVE  OFFICE  VISIT  FOR  REFILLS (Patient taking differently: Take 15 mg by mouth daily. ), Disp: 30 tablet, Rfl: 1 .  methocarbamol (ROBAXIN) 750 MG tablet, Take 1 tablet (750 mg total) by mouth at bedtime., Disp: 30 tablet, Rfl: 11 .  metoprolol tartrate (LOPRESSOR) 50 MG tablet, Take 1 tablet (50 mg total) by mouth 2 (two) times daily., Disp: 60 tablet, Rfl: 11 .  Multiple Vitamins-Minerals (MULTIVITAMIN PO), Take  1 tablet by mouth daily., Disp: , Rfl:  .  mupirocin cream (BACTROBAN) 2 %, Apply to affected area daily, Disp: 15 g, Rfl: 0 .  mupirocin ointment (BACTROBAN) 2 %, Apply twice daily to the affected area x 7 days or until healed, Disp: 22 g, Rfl: 1 .  nitroGLYCERIN (NITROSTAT) 0.4 MG SL tablet, Place 1 tablet (0.4 mg total) under the tongue every 5 (five) minutes as needed for chest pain., Disp: 25 tablet, Rfl: 11 .  sulfamethoxazole-trimethoprim (BACTRIM) 400-80 MG tablet, Take 1 tablet by mouth 2 (two) times daily., Disp: 8 tablet, Rfl: 0  Allergies  Allergen Reactions  . Hydrazine Yellow [Tartrazine] Shortness Of Breath and Swelling    Swelling mostly noticed in legs and feet, retaining urination, shortness of breaht, and minor chest pain  . Lisinopril Shortness Of Breath    Was on prinzide; had sob/chest pain on it.  . Tylenol [Acetaminophen] Itching and Swelling    Itching of the mouth, swelling of tongue and stomach started hurting    Objective:  Vascular Examination: Capillary refill time <3 seconds x 10 digits  Dorsalis pedis 2/4 b/l.   Posterior tibial pulses 1/4 b/l.  Digital hair sparse x 10 digits.  Skin temperature gradient WNL b/l  Dermatological Examination: Skin with normal turgor, texture and tone b/l  Dressing right LE clean, dry and intact.  Toenails 1-5 b/l discolored, thick, dystrophic with subungual debris and pain with palpation to nailbeds due to thickness of nails.  Incurvated nailplate b/l great toes with tenderness to palpation. No erythema, no edema, no drainage noted.  Hyperkeratotic lesion submet head 1 right foot. No erythema, no edema, no drainage, no flocculence noted.  Musculoskeletal: Muscle strength 5/5 to all LE muscle groups  No gross bony deformities b/l.  No pain, crepitus or joint limitation noted with ROM.   Neurological: Sensation decreased with 10 gram monofilament.  Assessment: Painful onychomycosis toenails 1-5 b/l   Callus submet head 1 right foot  Plan: 1. Toenails 1-5 b/l were debrided in length and girth without iatrogenic bleeding. Offending nail borders debrided and curretaged b/l great toes. Borders cleansed with alcohol. Antibiotic ointment applied. No further treatment required by patient. Calluses pared submetatarsal head(s) 1 right foot utilizing sterile scalpel blade without incident. 2. Currently, I am unable to find a local vendor for diabetic shoes. I have instructed her to purchase [employer] sneakers, series 600 or higher for appropriate foot gear. Patient related understanding. Patient to continue soft, supportive shoe gear daily. 3. Patient to report any pedal injuries to medical professional immediately. 4. Follow up 3 months.  5. Patient/POA to call should there be a concern in the interim.

## 2019-03-15 ENCOUNTER — Telehealth (INDEPENDENT_AMBULATORY_CARE_PROVIDER_SITE_OTHER): Payer: Self-pay | Admitting: Physician Assistant

## 2019-03-15 NOTE — Telephone Encounter (Signed)
1) Medication(s) Requested (by name): hydrochlorothizine 2) Pharmacy of Choice:  [facility].

## 2019-03-16 NOTE — Telephone Encounter (Signed)
This medication was discontinued last year, if she feels she needs this medication for some reason please get her in touch with the triage RN or her PCP's nurse.

## 2019-03-16 NOTE — Telephone Encounter (Signed)
Contacted patient and informed of message.

## 2019-03-20 DIAGNOSIS — E1142 Type 2 diabetes mellitus with diabetic polyneuropathy: Secondary | ICD-10-CM | POA: Diagnosis not present

## 2019-03-20 DIAGNOSIS — Z6841 Body Mass Index (BMI) 40.0 and over, adult: Secondary | ICD-10-CM | POA: Diagnosis not present

## 2019-03-20 DIAGNOSIS — L97811 Non-pressure chronic ulcer of other part of right lower leg limited to breakdown of skin: Secondary | ICD-10-CM | POA: Diagnosis not present

## 2019-03-20 DIAGNOSIS — Z7689 Persons encountering health services in other specified circumstances: Secondary | ICD-10-CM | POA: Diagnosis not present

## 2019-03-20 DIAGNOSIS — E11622 Type 2 diabetes mellitus with other skin ulcer: Secondary | ICD-10-CM | POA: Diagnosis not present

## 2019-03-20 DIAGNOSIS — L02415 Cutaneous abscess of right lower limb: Secondary | ICD-10-CM | POA: Diagnosis not present

## 2019-03-20 DIAGNOSIS — I110 Hypertensive heart disease with heart failure: Secondary | ICD-10-CM | POA: Diagnosis not present

## 2019-03-20 DIAGNOSIS — I2510 Atherosclerotic heart disease of native coronary artery without angina pectoris: Secondary | ICD-10-CM | POA: Diagnosis not present

## 2019-03-20 DIAGNOSIS — I252 Old myocardial infarction: Secondary | ICD-10-CM | POA: Diagnosis not present

## 2019-03-20 DIAGNOSIS — Z955 Presence of coronary angioplasty implant and graft: Secondary | ICD-10-CM | POA: Diagnosis not present

## 2019-03-20 DIAGNOSIS — I509 Heart failure, unspecified: Secondary | ICD-10-CM | POA: Diagnosis not present

## 2019-03-20 DIAGNOSIS — L97212 Non-pressure chronic ulcer of right calf with fat layer exposed: Secondary | ICD-10-CM | POA: Diagnosis not present

## 2019-03-26 ENCOUNTER — Other Ambulatory Visit (INDEPENDENT_AMBULATORY_CARE_PROVIDER_SITE_OTHER): Payer: Self-pay | Admitting: Nurse Practitioner

## 2019-03-26 DIAGNOSIS — I10 Essential (primary) hypertension: Secondary | ICD-10-CM

## 2019-03-27 ENCOUNTER — Encounter (HOSPITAL_BASED_OUTPATIENT_CLINIC_OR_DEPARTMENT_OTHER): Payer: Medicaid Other | Attending: Internal Medicine

## 2019-03-27 ENCOUNTER — Ambulatory Visit: Payer: Medicaid Other | Admitting: Physical Medicine & Rehabilitation

## 2019-03-27 DIAGNOSIS — I890 Lymphedema, not elsewhere classified: Secondary | ICD-10-CM | POA: Diagnosis not present

## 2019-03-27 DIAGNOSIS — I509 Heart failure, unspecified: Secondary | ICD-10-CM | POA: Insufficient documentation

## 2019-03-27 DIAGNOSIS — Z7689 Persons encountering health services in other specified circumstances: Secondary | ICD-10-CM | POA: Diagnosis not present

## 2019-03-27 DIAGNOSIS — E1140 Type 2 diabetes mellitus with diabetic neuropathy, unspecified: Secondary | ICD-10-CM | POA: Diagnosis not present

## 2019-03-27 DIAGNOSIS — L97212 Non-pressure chronic ulcer of right calf with fat layer exposed: Secondary | ICD-10-CM | POA: Insufficient documentation

## 2019-03-27 DIAGNOSIS — I110 Hypertensive heart disease with heart failure: Secondary | ICD-10-CM | POA: Insufficient documentation

## 2019-03-27 DIAGNOSIS — I252 Old myocardial infarction: Secondary | ICD-10-CM | POA: Diagnosis not present

## 2019-03-27 DIAGNOSIS — E11622 Type 2 diabetes mellitus with other skin ulcer: Secondary | ICD-10-CM | POA: Diagnosis not present

## 2019-03-27 DIAGNOSIS — I2510 Atherosclerotic heart disease of native coronary artery without angina pectoris: Secondary | ICD-10-CM | POA: Diagnosis not present

## 2019-04-03 DIAGNOSIS — I2510 Atherosclerotic heart disease of native coronary artery without angina pectoris: Secondary | ICD-10-CM | POA: Diagnosis not present

## 2019-04-03 DIAGNOSIS — L97212 Non-pressure chronic ulcer of right calf with fat layer exposed: Secondary | ICD-10-CM | POA: Diagnosis not present

## 2019-04-03 DIAGNOSIS — I509 Heart failure, unspecified: Secondary | ICD-10-CM | POA: Diagnosis not present

## 2019-04-03 DIAGNOSIS — E1140 Type 2 diabetes mellitus with diabetic neuropathy, unspecified: Secondary | ICD-10-CM | POA: Diagnosis not present

## 2019-04-03 DIAGNOSIS — I890 Lymphedema, not elsewhere classified: Secondary | ICD-10-CM | POA: Diagnosis not present

## 2019-04-03 DIAGNOSIS — I252 Old myocardial infarction: Secondary | ICD-10-CM | POA: Diagnosis not present

## 2019-04-03 DIAGNOSIS — E11622 Type 2 diabetes mellitus with other skin ulcer: Secondary | ICD-10-CM | POA: Diagnosis not present

## 2019-04-03 DIAGNOSIS — I110 Hypertensive heart disease with heart failure: Secondary | ICD-10-CM | POA: Diagnosis not present

## 2019-04-04 ENCOUNTER — Encounter: Payer: Medicaid Other | Attending: Physical Medicine & Rehabilitation

## 2019-04-04 ENCOUNTER — Ambulatory Visit: Payer: Medicaid Other | Admitting: Physical Medicine & Rehabilitation

## 2019-04-04 ENCOUNTER — Other Ambulatory Visit: Payer: Self-pay

## 2019-04-10 DIAGNOSIS — I252 Old myocardial infarction: Secondary | ICD-10-CM | POA: Diagnosis not present

## 2019-04-10 DIAGNOSIS — E1140 Type 2 diabetes mellitus with diabetic neuropathy, unspecified: Secondary | ICD-10-CM | POA: Diagnosis not present

## 2019-04-10 DIAGNOSIS — Z7689 Persons encountering health services in other specified circumstances: Secondary | ICD-10-CM | POA: Diagnosis not present

## 2019-04-10 DIAGNOSIS — I509 Heart failure, unspecified: Secondary | ICD-10-CM | POA: Diagnosis not present

## 2019-04-10 DIAGNOSIS — I2510 Atherosclerotic heart disease of native coronary artery without angina pectoris: Secondary | ICD-10-CM | POA: Diagnosis not present

## 2019-04-10 DIAGNOSIS — L97212 Non-pressure chronic ulcer of right calf with fat layer exposed: Secondary | ICD-10-CM | POA: Diagnosis not present

## 2019-04-10 DIAGNOSIS — I110 Hypertensive heart disease with heart failure: Secondary | ICD-10-CM | POA: Diagnosis not present

## 2019-04-10 DIAGNOSIS — I890 Lymphedema, not elsewhere classified: Secondary | ICD-10-CM | POA: Diagnosis not present

## 2019-04-10 DIAGNOSIS — E11622 Type 2 diabetes mellitus with other skin ulcer: Secondary | ICD-10-CM | POA: Diagnosis not present

## 2019-04-12 ENCOUNTER — Ambulatory Visit (INDEPENDENT_AMBULATORY_CARE_PROVIDER_SITE_OTHER): Payer: Medicaid Other | Admitting: Primary Care

## 2019-04-12 ENCOUNTER — Ambulatory Visit: Payer: Medicaid Other | Admitting: Primary Care

## 2019-04-12 ENCOUNTER — Ambulatory Visit: Payer: Medicaid Other | Attending: Primary Care

## 2019-04-12 ENCOUNTER — Other Ambulatory Visit: Payer: Self-pay

## 2019-04-12 DIAGNOSIS — L0390 Cellulitis, unspecified: Secondary | ICD-10-CM

## 2019-04-12 DIAGNOSIS — Z7689 Persons encountering health services in other specified circumstances: Secondary | ICD-10-CM | POA: Diagnosis not present

## 2019-04-12 DIAGNOSIS — E1165 Type 2 diabetes mellitus with hyperglycemia: Secondary | ICD-10-CM

## 2019-04-12 DIAGNOSIS — Z794 Long term (current) use of insulin: Principal | ICD-10-CM

## 2019-04-12 DIAGNOSIS — I10 Essential (primary) hypertension: Secondary | ICD-10-CM

## 2019-04-13 ENCOUNTER — Encounter: Payer: Self-pay | Admitting: Primary Care

## 2019-04-13 ENCOUNTER — Ambulatory Visit: Payer: Medicaid Other | Attending: Primary Care | Admitting: Primary Care

## 2019-04-13 ENCOUNTER — Other Ambulatory Visit: Payer: Self-pay | Admitting: Pharmacist

## 2019-04-13 DIAGNOSIS — R202 Paresthesia of skin: Secondary | ICD-10-CM

## 2019-04-13 DIAGNOSIS — G4733 Obstructive sleep apnea (adult) (pediatric): Secondary | ICD-10-CM

## 2019-04-13 DIAGNOSIS — E782 Mixed hyperlipidemia: Secondary | ICD-10-CM | POA: Diagnosis not present

## 2019-04-13 DIAGNOSIS — E1165 Type 2 diabetes mellitus with hyperglycemia: Secondary | ICD-10-CM | POA: Diagnosis not present

## 2019-04-13 DIAGNOSIS — I252 Old myocardial infarction: Secondary | ICD-10-CM | POA: Diagnosis not present

## 2019-04-13 DIAGNOSIS — J45909 Unspecified asthma, uncomplicated: Secondary | ICD-10-CM

## 2019-04-13 DIAGNOSIS — E7800 Pure hypercholesterolemia, unspecified: Secondary | ICD-10-CM | POA: Insufficient documentation

## 2019-04-13 DIAGNOSIS — E781 Pure hyperglyceridemia: Secondary | ICD-10-CM | POA: Insufficient documentation

## 2019-04-13 DIAGNOSIS — E119 Type 2 diabetes mellitus without complications: Secondary | ICD-10-CM | POA: Insufficient documentation

## 2019-04-13 DIAGNOSIS — E118 Type 2 diabetes mellitus with unspecified complications: Secondary | ICD-10-CM | POA: Diagnosis not present

## 2019-04-13 DIAGNOSIS — Z79899 Other long term (current) drug therapy: Secondary | ICD-10-CM | POA: Diagnosis not present

## 2019-04-13 DIAGNOSIS — Z955 Presence of coronary angioplasty implant and graft: Secondary | ICD-10-CM | POA: Diagnosis not present

## 2019-04-13 DIAGNOSIS — R200 Anesthesia of skin: Secondary | ICD-10-CM

## 2019-04-13 DIAGNOSIS — Z794 Long term (current) use of insulin: Secondary | ICD-10-CM | POA: Insufficient documentation

## 2019-04-13 DIAGNOSIS — J4520 Mild intermittent asthma, uncomplicated: Secondary | ICD-10-CM

## 2019-04-13 DIAGNOSIS — I10 Essential (primary) hypertension: Secondary | ICD-10-CM | POA: Insufficient documentation

## 2019-04-13 LAB — CBC
Hematocrit: 41 % (ref 34.0–46.6)
Hemoglobin: 13.2 g/dL (ref 11.1–15.9)
MCH: 26.6 pg (ref 26.6–33.0)
MCHC: 32.2 g/dL (ref 31.5–35.7)
MCV: 83 fL (ref 79–97)
Platelets: 284 10*3/uL (ref 150–450)
RBC: 4.97 x10E6/uL (ref 3.77–5.28)
RDW: 14.1 % (ref 11.7–15.4)
WBC: 7.6 10*3/uL (ref 3.4–10.8)

## 2019-04-13 LAB — LIPID PANEL
Chol/HDL Ratio: 3.5 ratio (ref 0.0–4.4)
Cholesterol, Total: 149 mg/dL (ref 100–199)
HDL: 42 mg/dL (ref >39)
LDL Calculated: 43 mg/dL (ref 0–99)
Triglycerides: 320 mg/dL — ABNORMAL HIGH (ref 0–149)
VLDL Cholesterol Cal: 64 mg/dL — ABNORMAL HIGH (ref 5–40)

## 2019-04-13 LAB — HEMOGLOBIN A1C
Est. average glucose Bld gHb Est-mCnc: 194 mg/dL
Hgb A1c MFr Bld: 8.4 % — ABNORMAL HIGH (ref 4.8–5.6)

## 2019-04-13 MED ORDER — INSULIN REGULAR HUMAN (CONC) 500 UNIT/ML ~~LOC~~ SOLN: SUBCUTANEOUS | 3 refills | Status: DC

## 2019-04-13 MED ORDER — ALBUTEROL SULFATE HFA 108 (90 BASE) MCG/ACT IN AERS: INHALATION_SPRAY | RESPIRATORY_TRACT | 2 refills | Status: DC

## 2019-04-13 MED ORDER — EZETIMIBE 10 MG PO TABS: 10.0000 mg | ORAL_TABLET | Freq: Every day | ORAL | 3 refills | Status: DC

## 2019-04-13 MED ORDER — DULOXETINE HCL 30 MG PO CPEP: 30.0000 mg | ORAL_CAPSULE | Freq: Every day | ORAL | 0 refills | Status: DC

## 2019-04-13 MED ORDER — INSULIN PEN NEEDLE 31G X 5 MM MISC: 1 refills | Status: DC

## 2019-04-13 NOTE — Progress Notes (Signed)
Virtual Visit via Telephone Note  I connected with Lindsay [name] on 04/13/19 at  4:15 PM EDT by telephone and verified that I am speaking with the correct person using two identifiers.   I discussed the limitations, risks, security and privacy concerns of performing an evaluation and management service by telephone and the availability of in person appointments. I also discussed with the patient that there may be a patient responsible charge related to this service. The patient expressed understanding and agreed to proceed.   History of Present Illness: Lindsay [name]   with a hx of NSTEMI September 2017, with pk troponin 6.64 with cath and PCI with DES to mLAD and PTCA only to 2nd diag ostium. Still with 50% residual stenosis. EF with cath 35-45%but Echo was 55-60%    Observations/Objective: Review of Systems  Constitutional: Negative.   HENT: Negative.   Eyes: Negative.   Respiratory: Negative.   Cardiovascular: Positive for chest pain.  Gastrointestinal: Negative.   Genitourinary: Positive for dysuria.  Musculoskeletal: Negative.   Skin: Negative.   Neurological: Negative.   Endo/Heme/Allergies: Negative.   Psychiatric/Behavioral: Negative.     Assessment and Plan: Diagnoses and all orders for this visit:  Essential hypertension Cardiology Dr.Smith added losartan on her last visit   Morbid obesity (HCC) Patient states weight now 404 lbs and wanting bariatric surgery   Elevated triglycerides with high cholesterol TG> 300 on atorvastatin 80 has not been compliant with taking cont and added Zetia  F/u with clinical pharmacist  Mild intermittent asthma without complication Refilled her SABA to f/u with Clinical pharmacist and or pulmonologist   Poorly controlled type 2 diabetes mellitus with complication (HCC)  A1C improved 3 months ago 10.1 down to 8.4 today -     insulin regular human CONCENTRATED (HUMULIN R) 500 UNIT/ML injection; INJECT 70 UNITS 30 MINUTES BEFORE  EACH MEAL -     Insulin Pen Needle (B-D UF III MINI PEN NEEDLES) 31G X 5 MM MISC; Use as instructed. Monitor blood glucose levels three times per day  Uncomplicated asthma, unspecified asthma severity, unspecified whether persistent -     albuterol (PROVENTIL HFA) 108 (90 Base) MCG/ACT inhaler; INHALE 2 PUFFS BY MOUTH EVERY 6 HOURS AS NEEDED FOR WHEEZING OR SHORTNESS OF BREATH  Other orders -     ezetimibe (ZETIA) 10 MG tablet; Take 1 tablet (10 mg total) by mouth daily.  The time spent was reviewing results from a C pap done 2019 speaking with the Pulmonologist about if test needed to be repeated or can it be done now. Pulmonologist guided me through how to order materials and patient must be seen by pcp in 90 days for tolerance of Cpap  Follow Up Instructions:    I discussed the assessment and treatment plan with the patient. The patient was provided an opportunity to ask questions and all were answered. The patient agreed with the plan and demonstrated an understanding of the instructions.   The patient was advised to call back or seek an in-person evaluation if the symptoms worsen or if the condition fails to improve as anticipated.  I provided 60 minutes of non-face-to-face time during this encounter.   Grayce Sessions, NP

## 2019-04-17 ENCOUNTER — Telehealth: Payer: Self-pay

## 2019-04-17 DIAGNOSIS — L97212 Non-pressure chronic ulcer of right calf with fat layer exposed: Secondary | ICD-10-CM | POA: Diagnosis not present

## 2019-04-17 DIAGNOSIS — I252 Old myocardial infarction: Secondary | ICD-10-CM | POA: Diagnosis not present

## 2019-04-17 DIAGNOSIS — I2510 Atherosclerotic heart disease of native coronary artery without angina pectoris: Secondary | ICD-10-CM | POA: Diagnosis not present

## 2019-04-17 DIAGNOSIS — E1140 Type 2 diabetes mellitus with diabetic neuropathy, unspecified: Secondary | ICD-10-CM | POA: Diagnosis not present

## 2019-04-17 DIAGNOSIS — Z7689 Persons encountering health services in other specified circumstances: Secondary | ICD-10-CM | POA: Diagnosis not present

## 2019-04-17 DIAGNOSIS — I509 Heart failure, unspecified: Secondary | ICD-10-CM | POA: Diagnosis not present

## 2019-04-17 DIAGNOSIS — E11622 Type 2 diabetes mellitus with other skin ulcer: Secondary | ICD-10-CM | POA: Diagnosis not present

## 2019-04-17 DIAGNOSIS — I890 Lymphedema, not elsewhere classified: Secondary | ICD-10-CM | POA: Diagnosis not present

## 2019-04-17 DIAGNOSIS — I110 Hypertensive heart disease with heart failure: Secondary | ICD-10-CM | POA: Diagnosis not present

## 2019-04-17 NOTE — Telephone Encounter (Signed)
Order for CPAP faxed to [facility] 

## 2019-04-24 ENCOUNTER — Encounter (HOSPITAL_BASED_OUTPATIENT_CLINIC_OR_DEPARTMENT_OTHER): Payer: Medicaid Other | Attending: Internal Medicine

## 2019-04-24 ENCOUNTER — Other Ambulatory Visit: Payer: Self-pay

## 2019-04-24 DIAGNOSIS — I2510 Atherosclerotic heart disease of native coronary artery without angina pectoris: Secondary | ICD-10-CM | POA: Insufficient documentation

## 2019-04-24 DIAGNOSIS — L02415 Cutaneous abscess of right lower limb: Secondary | ICD-10-CM | POA: Diagnosis not present

## 2019-04-24 DIAGNOSIS — Z872 Personal history of diseases of the skin and subcutaneous tissue: Secondary | ICD-10-CM | POA: Insufficient documentation

## 2019-04-24 DIAGNOSIS — I509 Heart failure, unspecified: Secondary | ICD-10-CM | POA: Diagnosis not present

## 2019-04-24 DIAGNOSIS — I252 Old myocardial infarction: Secondary | ICD-10-CM | POA: Insufficient documentation

## 2019-04-24 DIAGNOSIS — E1140 Type 2 diabetes mellitus with diabetic neuropathy, unspecified: Secondary | ICD-10-CM | POA: Insufficient documentation

## 2019-04-24 DIAGNOSIS — Z09 Encounter for follow-up examination after completed treatment for conditions other than malignant neoplasm: Secondary | ICD-10-CM | POA: Diagnosis not present

## 2019-04-24 DIAGNOSIS — I110 Hypertensive heart disease with heart failure: Secondary | ICD-10-CM | POA: Diagnosis not present

## 2019-04-24 DIAGNOSIS — Z7689 Persons encountering health services in other specified circumstances: Secondary | ICD-10-CM | POA: Diagnosis not present

## 2019-04-26 ENCOUNTER — Ambulatory Visit: Payer: Medicaid Other | Attending: Family Medicine | Admitting: Pharmacist

## 2019-04-26 ENCOUNTER — Other Ambulatory Visit: Payer: Self-pay

## 2019-04-26 ENCOUNTER — Encounter: Payer: Self-pay | Admitting: Pharmacist

## 2019-04-26 ENCOUNTER — Telehealth: Payer: Self-pay

## 2019-04-26 DIAGNOSIS — G629 Polyneuropathy, unspecified: Secondary | ICD-10-CM | POA: Insufficient documentation

## 2019-04-26 DIAGNOSIS — E1140 Type 2 diabetes mellitus with diabetic neuropathy, unspecified: Secondary | ICD-10-CM | POA: Insufficient documentation

## 2019-04-26 DIAGNOSIS — H538 Other visual disturbances: Secondary | ICD-10-CM | POA: Insufficient documentation

## 2019-04-26 DIAGNOSIS — Z833 Family history of diabetes mellitus: Secondary | ICD-10-CM | POA: Diagnosis not present

## 2019-04-26 DIAGNOSIS — Z7689 Persons encountering health services in other specified circumstances: Secondary | ICD-10-CM | POA: Diagnosis not present

## 2019-04-26 DIAGNOSIS — Z794 Long term (current) use of insulin: Secondary | ICD-10-CM | POA: Diagnosis not present

## 2019-04-26 NOTE — Patient Instructions (Signed)
Thank you for coming to see me today. Please do the following:  1. Continue current regimen.  2. Continue checking blood sugars at home.  3. Continue making the lifestyle changes we've discussed together during our visit. Diet and exercise play a significant role in improving your blood sugars.  4. Follow-up with your PCP.   Hypoglycemia or low blood sugar:   Low blood sugar can happen quickly and may become an emergency if not treated right away.   While this shouldn't happen often, it can be brought upon if you skip a meal or do not eat enough. Also, if your insulin or other diabetes medications are dosed too high, this can cause your blood sugar to go to low.   Warning signs of low blood sugar include: 1. Feeling shaky or dizzy 2. Feeling weak or tired  3. Excessive hunger 4. Feeling anxious or upset  5. Sweating even when you aren't exercising  What to do if I experience low blood sugar? 1. Check your blood sugar with your meter. If lower than 70, proceed to step 2.  2. Treat with 3-4 glucose tablets or 3 packets of regular sugar. If these aren't around, you can try hard candy. Yet another option would be to drink 4 ounces of fruit juice or 6 ounces of REGULAR soda.  3. Re-check your sugar in 15 minutes. If it is still below 70, do what you did in step 2 again. If has come back up, go ahead and eat a snack or small meal at this time.

## 2019-04-26 NOTE — Telephone Encounter (Signed)
Call placed to [facility] to check on status of CPAP referral.  Spoke to [facility] who stated that they are not able to process the referral as the sleep study is too old. It needs to be completed within 1 year of the referral.

## 2019-04-26 NOTE — Progress Notes (Signed)
    S:    PCP: Gwinda Passe   No chief complaint on file.  Patient arrives in good spirits. Presents for diabetes evaluation, education, and management at the request of Marcelino Duster. Patient was referred on 04/13/19 .   Patient reports diabetes was diagnosed >10 yrs ago.   Family/Social History:  - DM (mother, father), heart disease (mother, father), HTN (mother) - Tobacco: never smoker  - Alcohol: occasionally  Insurance coverage/medication affordability:  - [place] Medicaid  Patient reports adherence with medications.  Current diabetes medications include: Humulin U500 70 units TID w/ meals, Lantus 35 units BID; reports using Jardiance but I cannot find this on her list  Patient denies hypoglycemic events.  Patient reported dietary habits: Eats 3  meals/day - Admist to eating cookies and drinking sweet tea - Does not adhere to a diabetic diet - Reports intake of potatoes and beans daily but does limit portion size  Patient-reported exercise habits:  - denies    Patient denies nocturia.  Patient reports baseline neuropathy. Patient reports baseline blurry vision. Patient reports self foot exams.   O:  Home fasting CBG: 85 - 120s  2 hour post-prandial/random CBG: 115 - 225  Lab Results  Component Value Date   HGBA1C 8.4 (H) 04/12/2019   There were no vitals filed for this visit.  Lipid Panel     Component Value Date/Time   CHOL 149 04/12/2019 1017   TRIG 320 (H) 04/12/2019 1017   HDL 42 04/12/2019 1017   CHOLHDL 3.5 04/12/2019 1017   CHOLHDL 3 04/12/2018 1006   VLDL 38.6 04/12/2018 1006   LDLCALC 43 04/12/2019 1017   Clinical ASCVD: Yes   A/P: Diabetes longstanding currently uncontrolled. Patient is able to verbalize appropriate hypoglycemia management plan. Patient is adherent with medication. Control is suboptimal due to dietary indiscretion and physical inactivity.   She has a complex insulin regimen w/ reported 70 units of regular insulin before meals.  Cannot tolerate metformin. With her clinical ASCVD and weight, Victoza would be ideal but patient does not wish to add another injection. She currently injects insulin five times a day. She claims she takes Jardiance but cannot verify her dose and it is not listed on her med list.  I recommend she be referred to Endo. She has seen Endo in the past. I will send a message to her PCP to request that she make a referral. No changes today as patient reports goal sugars when she adheres to her diet.    -Continued current regimen.   -Extensively discussed pathophysiology of DM, recommended lifestyle interventions, dietary effects on glycemic control -Counseled on s/sx of and management of hypoglycemia -Next A1C anticipated 06/2019.  -Microalb:SCr ratio, future.   ASCVD risk - secondary prevention in patient with DM. Last LDL is controlled. High intensity statin indicated. Pt is also taking Zetia with LDL goal < 70. Commended her on control and encouraged compliance. -Continued atorvastatin 80 mg. -Continue Zetia 10 mg daily.   HM: UTD on vaccines.   Written patient instructions provided. Total time in face to face counseling 15 minutes.   Follow up w/ PCP in July.  Butch Penny, PharmD, CPP Clinical Pharmacist [facility] [phone]

## 2019-04-27 ENCOUNTER — Other Ambulatory Visit: Payer: Self-pay | Admitting: Primary Care

## 2019-04-27 DIAGNOSIS — G4733 Obstructive sleep apnea (adult) (pediatric): Secondary | ICD-10-CM

## 2019-05-08 ENCOUNTER — Other Ambulatory Visit (INDEPENDENT_AMBULATORY_CARE_PROVIDER_SITE_OTHER): Payer: Self-pay | Admitting: Primary Care

## 2019-05-08 ENCOUNTER — Other Ambulatory Visit: Payer: Self-pay | Admitting: Primary Care

## 2019-05-08 DIAGNOSIS — E1140 Type 2 diabetes mellitus with diabetic neuropathy, unspecified: Secondary | ICD-10-CM

## 2019-05-08 NOTE — Telephone Encounter (Signed)
Refill request if appropriate.

## 2019-05-23 ENCOUNTER — Other Ambulatory Visit (HOSPITAL_COMMUNITY): Payer: Medicaid Other

## 2019-05-24 ENCOUNTER — Other Ambulatory Visit (HOSPITAL_COMMUNITY)
Admission: RE | Admit: 2019-05-24 | Discharge: 2019-05-24 | Disposition: A | Discharge status: Home / Self Care | Payer: Medicaid Other | Source: Ambulatory Visit | Attending: Internal Medicine | Admitting: Internal Medicine

## 2019-05-24 DIAGNOSIS — Z1159 Encounter for screening for other viral diseases: Secondary | ICD-10-CM | POA: Insufficient documentation

## 2019-05-25 LAB — NOVEL CORONAVIRUS, NAA (HOSP ORDER, SEND-OUT TO REF LAB; TAT 18-24 HRS): SARS-CoV-2, NAA: NOT DETECTED

## 2019-05-27 ENCOUNTER — Other Ambulatory Visit: Payer: Self-pay

## 2019-05-27 ENCOUNTER — Ambulatory Visit (HOSPITAL_BASED_OUTPATIENT_CLINIC_OR_DEPARTMENT_OTHER): Payer: Medicaid Other | Attending: Primary Care | Admitting: Internal Medicine

## 2019-05-27 VITALS — Ht 67.0 in | Wt >= 6400 oz

## 2019-05-27 DIAGNOSIS — G4733 Obstructive sleep apnea (adult) (pediatric): Secondary | ICD-10-CM | POA: Diagnosis not present

## 2019-05-29 ENCOUNTER — Other Ambulatory Visit: Payer: Self-pay

## 2019-06-02 ENCOUNTER — Ambulatory Visit: Payer: Medicaid Other | Admitting: Podiatry

## 2019-06-04 DIAGNOSIS — G4733 Obstructive sleep apnea (adult) (pediatric): Secondary | ICD-10-CM | POA: Diagnosis not present

## 2019-06-04 NOTE — Procedures (Signed)
   Patient Name: Lindsay [name], Lindsay [name] Study Date: 05/27/2019 Gender: Female D.O.B: 1981-05-29 Age (years): 37 Referring Provider: [name] NP Height (inches): 5 Interpreting Physician: Baird Lyons MD, ABSM Weight (lbs): 403 RPSGT: Earney Hamburg BMI: 63 MRN: [id number] Neck Size: 19.00  CLINICAL INFORMATION Sleep Study Type: NPSG Indication for sleep study: OSA, snoring Epworth Sleepiness Score: 1 Most recent polysomnogram dated 09/17/2016 revealed an AHI of 8.4/h and RDI of 17.1/h.  SLEEP STUDY TECHNIQUE As per the AASM Manual for the Scoring of Sleep and Associated Events v2.3 (April 2016) with a hypopnea requiring 4% desaturations.  The channels recorded and monitored were frontal, central and occipital EEG, electrooculogram (EOG), submentalis EMG (chin), nasal and oral airflow, thoracic and abdominal wall motion, anterior tibialis EMG, snore microphone, electrocardiogram, and pulse oximetry.  MEDICATIONS Medications self-administered by patient taken the night of the study : IBUPROFEN, GABAPENTIN, LANTUS  SLEEP ARCHITECTURE The study was initiated at 10:20:04 PM and ended at 4:41:09 AM.  Sleep onset time was 59.3 minutes and the sleep efficiency was 78.0%%. The total sleep time was 297.2 minutes.  Stage REM latency was 7.0 minutes.  The patient spent 1.0%% of the night in stage N1 sleep, 84.2%% in stage N2 sleep, 0.0%% in stage N3 and 14.8% in REM.  Alpha intrusion was absent.  Supine sleep was 45.56%.  RESPIRATORY PARAMETERS The overall apnea/hypopnea index (AHI) was 36.3 per hour. There were 8 total apneas, including 8 obstructive, 0 central and 0 mixed apneas. There were 172 hypopneas and 2 RERAs.  The AHI during Stage REM sleep was 12.3 per hour.  AHI while supine was 28.8 per hour.  The mean oxygen saturation was 89.5%. The minimum SpO2 during sleep was 52.0%.  loud snoring was noted during this study.  CARDIAC DATA The 2 lead EKG demonstrated  sinus rhythm. The mean heart rate was 83.2 beats per minute. Other EKG findings include: None.  LEG MOVEMENT DATA The total PLMS were 0 with a resulting PLMS index of 0.0. Associated arousal with leg movement index was 0.0 .  IMPRESSIONS - Severe obstructive sleep apnea occurred during this study (AHI = 36.3/h). - No significant central sleep apnea occurred during this study (CAI = 0.0/h). - Severe oxygen desaturation was noted during this study (Min O2 = 52.0%). Mean sat 89.5%, indicating nocturnal hypoxemia. - The patient snored with loud snoring volume. - No cardiac abnormalities were noted during this study. - Clinically significant periodic limb movements did not occur during sleep. No significant associated arousals.  DIAGNOSIS - Obstructive Sleep Apnea (327.23 [G47.33 ICD-10]) - Nocturnal Hypoxemia (327.26 [G47.36 ICD-10])  RECOMMENDATIONS - Suggest CPAP titration sleep study, or autopap 5-20 through DME company, mask of choice, humidifier,supplies, Airview or download card. - Avoid alcohol, sedatives and other CNS depressants that may worsen sleep apnea and disrupt normal sleep architecture. - Sleep hygiene should be reviewed to assess factors that may improve sleep quality. - Weight management and regular exercise should be initiated or continued if appropriate.  [Electronically signed] 06/04/2019 11:23 AM  Baird Lyons MD, ABSM Diplomate, American Board of Sleep Medicine   NPI: [id number]                          [place], [place] [facility]  ELECTRONICALLY SIGNED ON:  06/04/2019, 11:19 AM [place] PH: [phone]   FX: [phone] [place]

## 2019-06-05 ENCOUNTER — Other Ambulatory Visit (INDEPENDENT_AMBULATORY_CARE_PROVIDER_SITE_OTHER): Payer: Self-pay | Admitting: Primary Care

## 2019-06-09 ENCOUNTER — Other Ambulatory Visit: Payer: Self-pay

## 2019-06-09 ENCOUNTER — Other Ambulatory Visit (INDEPENDENT_AMBULATORY_CARE_PROVIDER_SITE_OTHER): Payer: Self-pay | Admitting: Primary Care

## 2019-06-09 ENCOUNTER — Ambulatory Visit: Payer: Medicaid Other | Admitting: Podiatry

## 2019-06-09 ENCOUNTER — Encounter: Payer: Self-pay | Admitting: Podiatry

## 2019-06-09 VITALS — Temp 97.5°F

## 2019-06-09 DIAGNOSIS — M79674 Pain in right toe(s): Secondary | ICD-10-CM

## 2019-06-09 DIAGNOSIS — L84 Corns and callosities: Secondary | ICD-10-CM

## 2019-06-09 DIAGNOSIS — E1142 Type 2 diabetes mellitus with diabetic polyneuropathy: Secondary | ICD-10-CM

## 2019-06-09 DIAGNOSIS — M79675 Pain in left toe(s): Secondary | ICD-10-CM

## 2019-06-09 DIAGNOSIS — B351 Tinea unguium: Secondary | ICD-10-CM

## 2019-06-09 DIAGNOSIS — G4733 Obstructive sleep apnea (adult) (pediatric): Secondary | ICD-10-CM

## 2019-06-09 NOTE — Patient Instructions (Signed)
Diabetes Mellitus and Foot Care Foot care is an important part of your health, especially when you have diabetes. Diabetes may cause you to have problems because of poor blood flow (circulation) to your feet and legs, which can cause your skin to:  Become thinner and drier.  Break more easily.  Heal more slowly.  Peel and crack. You may also have nerve damage (neuropathy) in your legs and feet, causing decreased feeling in them. This means that you may not notice minor injuries to your feet that could lead to more serious problems. Noticing and addressing any potential problems early is the best way to prevent future foot problems. How to care for your feet Foot hygiene  Wash your feet daily with warm water and mild soap. Do not use hot water. Then, pat your feet and the areas between your toes until they are completely dry. Do not soak your feet as this can dry your skin.  Trim your toenails straight across. Do not dig under them or around the cuticle. File the edges of your nails with an emery board or nail file.  Apply a moisturizing lotion or petroleum jelly to the skin on your feet and to dry, brittle toenails. Use lotion that does not contain alcohol and is unscented. Do not apply lotion between your toes. Shoes and socks  Wear clean socks or stockings every day. Make sure they are not too tight. Do not wear knee-high stockings since they may decrease blood flow to your legs.  Wear shoes that fit properly and have enough cushioning. Always look in your shoes before you put them on to be sure there are no objects inside.  To break in new shoes, wear them for just a few hours a day. This prevents injuries on your feet. Wounds, scrapes, corns, and calluses  Check your feet daily for blisters, cuts, bruises, sores, and redness. If you cannot see the bottom of your feet, use a mirror or ask someone for help.  Do not cut corns or calluses or try to remove them with medicine.  If you  find a minor scrape, cut, or break in the skin on your feet, keep it and the skin around it clean and dry. You may clean these areas with mild soap and water. Do not clean the area with peroxide, alcohol, or iodine.  If you have a wound, scrape, corn, or callus on your foot, look at it several times a day to make sure it is healing and not infected. Check for: ? Redness, swelling, or pain. ? Fluid or blood. ? Warmth. ? Pus or a bad smell. General instructions  Do not cross your legs. This may decrease blood flow to your feet.  Do not use heating pads or hot water bottles on your feet. They may burn your skin. If you have lost feeling in your feet or legs, you may not know this is happening until it is too late.  Protect your feet from hot and cold by wearing shoes, such as at the beach or on hot pavement.  Schedule a complete foot exam at least once a year (annually) or more often if you have foot problems. If you have foot problems, report any cuts, sores, or bruises to your health care provider immediately. Contact a health care provider if:  You have a medical condition that increases your risk of infection and you have any cuts, sores, or bruises on your feet.  You have an injury that is not   healing.  You have redness on your legs or feet.  You feel burning or tingling in your legs or feet.  You have pain or cramps in your legs and feet.  Your legs or feet are numb.  Your feet always feel cold.  You have pain around a toenail. Get help right away if:  You have a wound, scrape, corn, or callus on your foot and: ? You have pain, swelling, or redness that gets worse. ? You have fluid or blood coming from the wound, scrape, corn, or callus. ? Your wound, scrape, corn, or callus feels warm to the touch. ? You have pus or a bad smell coming from the wound, scrape, corn, or callus. ? You have a fever. ? You have a red line going up your leg. Summary  Check your feet every day  for cuts, sores, red spots, swelling, and blisters.  Moisturize feet and legs daily.  Wear shoes that fit properly and have enough cushioning.  If you have foot problems, report any cuts, sores, or bruises to your health care provider immediately.  Schedule a complete foot exam at least once a year (annually) or more often if you have foot problems. This information is not intended to replace advice given to you by your health care provider. Make sure you discuss any questions you have with your health care provider. Document Released: 12/04/2000 Document Revised: 01/19/2018 Document Reviewed: 01/08/2017 Elsevier Interactive Patient Education  2019 [employer]  Onychomycosis/Fungal Toenails  WHAT IS IT? An infection that lies within the keratin of your nail plate that is caused by a fungus.  WHY ME? Fungal infections affect all ages, sexes, races, and creeds.  There may be many factors that predispose you to a fungal infection such as age, coexisting medical conditions such as diabetes, or an autoimmune disease; stress, medications, fatigue, genetics, etc.  Bottom line: fungus thrives in a warm, moist environment and your shoes offer such a location.  IS IT CONTAGIOUS? Theoretically, yes.  You do not want to share shoes, nail clippers or files with someone who has fungal toenails.  Walking around barefoot in the same room or sleeping in the same bed is unlikely to transfer the organism.  It is important to realize, however, that fungus can spread easily from one nail to the next on the same foot.  HOW DO WE TREAT THIS?  There are several ways to treat this condition.  Treatment may depend on many factors such as age, medications, pregnancy, liver and kidney conditions, etc.  It is best to ask your doctor which options are available to you.  1. No treatment.   Unlike many other medical concerns, you can live with this condition.  However for many people this can be a painful condition and  may lead to ingrown toenails or a bacterial infection.  It is recommended that you keep the nails cut short to help reduce the amount of fungal nail. 2. Topical treatment.  These range from herbal remedies to prescription strength nail lacquers.  About 40-50% effective, topicals require twice daily application for approximately 9 to 12 months or until an entirely new nail has grown out.  The most effective topicals are medical grade medications available through physicians offices. 3. Oral antifungal medications.  With an 80-90% cure rate, the most common oral medication requires 3 to 4 months of therapy and stays in your system for a year as the new nail grows out.  Oral antifungal medications do require   blood work to make sure it is a safe drug for you.  A liver function panel will be performed prior to starting the medication and after the first month of treatment.  It is important to have the blood work performed to avoid any harmful side effects.  In general, this medication safe but blood work is required. 4. Laser Therapy.  This treatment is performed by applying a specialized laser to the affected nail plate.  This therapy is noninvasive, fast, and non-painful.  It is not covered by insurance and is therefore, out of pocket.  The results have been very good with a 80-95% cure rate.  The [facility] is the only practice in the area to offer this therapy. 5. Permanent Nail Avulsion.  Removing the entire nail so that a new nail will not grow back. 

## 2019-06-10 ENCOUNTER — Other Ambulatory Visit (INDEPENDENT_AMBULATORY_CARE_PROVIDER_SITE_OTHER): Payer: Self-pay | Admitting: Primary Care

## 2019-06-10 ENCOUNTER — Other Ambulatory Visit (INDEPENDENT_AMBULATORY_CARE_PROVIDER_SITE_OTHER): Payer: Self-pay | Admitting: Nurse Practitioner

## 2019-06-10 DIAGNOSIS — E1140 Type 2 diabetes mellitus with diabetic neuropathy, unspecified: Secondary | ICD-10-CM

## 2019-06-10 DIAGNOSIS — E118 Type 2 diabetes mellitus with unspecified complications: Secondary | ICD-10-CM

## 2019-06-10 DIAGNOSIS — E1165 Type 2 diabetes mellitus with hyperglycemia: Secondary | ICD-10-CM

## 2019-06-20 NOTE — Progress Notes (Signed)
Subjective:  Lindsay [name] presents to clinic today for preventative diabetic foot care with cc of  painful, thick, discolored, elongated toenails 1-5 b/l that become tender and cannot cut because of thickness. Pain is aggravated when wearing enclosed shoe gear and relieved with periodic professional debridement.  Clent Demark, PA-C  Is her PCP. Last visit was 03/03/2019.   Current Outpatient Medications:  .  albuterol (PROVENTIL HFA) 108 (90 Base) MCG/ACT inhaler, INHALE 2 PUFFS BY MOUTH EVERY 6 HOURS AS NEEDED FOR WHEEZING OR SHORTNESS OF BREATH, Disp: 1 each, Rfl: 2 .  amLODipine (NORVASC) 10 MG tablet, TAKE 1/2 (ONE-HALF) TABLET BY MOUTH  DAILY, Disp: 45 tablet, Rfl: 0 .  aspirin 81 MG tablet, Take 1 tablet (81 mg total) by mouth daily., Disp: 30 tablet, Rfl: 11 .  aspirin-acetaminophen-caffeine (EXCEDRIN MIGRAINE) 250-250-65 MG tablet, Take 2 tablets by mouth every 6 (six) hours as needed for headache., Disp: , Rfl:  .  atorvastatin (LIPITOR) 80 MG tablet, Take 1 tablet (80 mg total) by mouth daily at 6 PM. Please call and schedule November/December follow up. [phone], Disp: 30 tablet, Rfl: 11 .  b complex vitamins tablet, Take 1 tablet by mouth daily., Disp: , Rfl:  .  BD VEO INSULIN SYRINGE U/F 31G X 15/64" 1 ML MISC, USE  SYRINGE ONCE DAILY, Disp: 100 each, Rfl: 1 .  Blood Glucose Monitoring Suppl (ACCU-CHEK AVIVA PLUS) w/Device KIT, 1 each by Does not apply route as directed., Disp: 1 kit, Rfl: 0 .  diclofenac sodium (VOLTAREN) 1 % GEL, Apply 2 g topically 4 (four) times daily. (Patient taking differently: Apply 2 g topically 4 (four) times daily as needed (Pain). ), Disp: 300 g, Rfl: 1 .  diphenhydrAMINE (BENADRYL) 25 MG tablet, Take 1 tablet (25 mg total) by mouth every 6 (six) hours as needed., Disp: 30 tablet, Rfl: 0 .  DULoxetine (CYMBALTA) 30 MG capsule, Take 1 capsule (30 mg total) by mouth daily., Disp: 90 capsule, Rfl: 0 .  ezetimibe (ZETIA) 10 MG tablet, Take 1  tablet (10 mg total) by mouth daily., Disp: 90 tablet, Rfl: 3 .  gabapentin (NEURONTIN) 300 MG capsule, TAKE 2 CAPSULES BY MOUTH THREE TIMES DAILY, Disp: 180 capsule, Rfl: 0 .  GARLIC PO, Take 1 tablet by mouth 2 (two) times daily., Disp: , Rfl:  .  glucose blood (ACCU-CHEK AVIVA PLUS) test strip, Use as instructed to test blood sugar 4 times daily., Disp: 100 each, Rfl: 12 .  hydrochlorothiazide (HYDRODIURIL) 25 MG tablet, Take 25 mg by mouth every morning., Disp: , Rfl:  .  ibuprofen (ADVIL,MOTRIN) 200 MG tablet, Take 400 mg by mouth every 6 (six) hours as needed for fever or moderate pain., Disp: , Rfl:  .  Insulin Glargine (LANTUS SOLOSTAR) 100 UNIT/ML Solostar Pen, Inject 50 Units into the skin at bedtime., Disp: 45 mL, Rfl: 2 .  Insulin Pen Needle (B-D UF III MINI PEN NEEDLES) 31G X 5 MM MISC, Use as instructed. Monitor blood glucose levels three times per day, Disp: 90 each, Rfl: 1 .  insulin regular human CONCENTRATED (HUMULIN R) 500 UNIT/ML injection, INJECT 70 UNITS 30 MINUTES BEFORE EACH MEAL, Disp: 20 mL, Rfl: 3 .  JARDIANCE 10 MG TABS tablet, TAKE  TABLET BY MOUTH ONCE DAILY WITH BREAKFAST, Disp: 30 tablet, Rfl: 0 .  losartan (COZAAR) 100 MG tablet, Take 1 tablet (100 mg total) by mouth daily., Disp: 90 tablet, Rfl: 3 .  Magnesium 250 MG TABS, Take 1 tablet  by mouth 2 (two) times daily., Disp: , Rfl:  .  Melatonin 10 MG TABS, Take 3 tablets by mouth at bedtime., Disp: 30 tablet, Rfl: 2 .  meloxicam (MOBIC) 15 MG tablet, TAKE ONE TABLET BY MOUTH ONCE DAILY .MUST  HAVE  OFFICE  VISIT  FOR  REFILLS (Patient taking differently: Take 15 mg by mouth daily. ), Disp: 30 tablet, Rfl: 1 .  methocarbamol (ROBAXIN) 750 MG tablet, Take 1 tablet (750 mg total) by mouth at bedtime., Disp: 30 tablet, Rfl: 11 .  metoprolol tartrate (LOPRESSOR) 50 MG tablet, Take 1 tablet (50 mg total) by mouth 2 (two) times daily., Disp: 60 tablet, Rfl: 11 .  Multiple Vitamins-Minerals (MULTIVITAMIN PO), Take 1 tablet by  mouth daily., Disp: , Rfl:  .  multivitamin (VIT W/EXTRA C) CHEW chewable tablet, Chew by mouth., Disp: , Rfl:  .  mupirocin cream (BACTROBAN) 2 %, Apply to affected area daily, Disp: 15 g, Rfl: 0 .  mupirocin ointment (BACTROBAN) 2 %, Apply twice daily to the affected area x 7 days or until healed, Disp: 22 g, Rfl: 1 .  nitroGLYCERIN (NITROSTAT) 0.4 MG SL tablet, Place 1 tablet (0.4 mg total) under the tongue every 5 (five) minutes as needed for chest pain., Disp: 25 tablet, Rfl: 11 .  sulfamethoxazole-trimethoprim (BACTRIM) 400-80 MG tablet, Take 1 tablet by mouth 2 (two) times daily., Disp: 8 tablet, Rfl: 0   Allergies  Allergen Reactions  . Hydrazine Yellow [Tartrazine] Shortness Of Breath and Swelling    Swelling mostly noticed in legs and feet, retaining urination, shortness of breaht, and minor chest pain  . Lisinopril Shortness Of Breath    Was on prinzide; had sob/chest pain on it.  . Tylenol [Acetaminophen] Itching and Swelling    Itching of the mouth, swelling of tongue and stomach started hurting     Objective: Vitals:   06/09/19 1544  Temp: (!) 97.5 F (36.4 C)    Physical Examination:  Vascular Examination: Capillary refill time <3 seconds x 10 digits.  DP pulses 2/4 b/l.  PT pulses 1/4 b/l.  Digital hair present b/l.  No edema noted b/l.  Skin temperature gradient WNL b/l.  Dermatological Examination: Skin with normal turgor, texture and tone b/l.  No open wounds b/l.  No interdigital macerations noted b/l.  Elongated, thick, discolored brittle toenails with subungual debris and pain on dorsal palpation of nailbeds 1-5 b/l.Incurvated nailplate b/l great toes with tenderness to palpation. No erythema, no edema, no drainage noted.  Hyperkeratotic lesion submet head 1 left foot. No edema, no erythema, no drainage, no flocculence.  Musculoskeletal Examination: Muscle strength 5/5 to all muscle groups b/l  No pain, crepitus or joint discomfort with  active/passive ROM.  Neurological Examination: Sensation decreased b/l with 10 gram monofilament.  Assessment: Mycotic nail infection with pain 1-5 b/l Callus submet head 1 right foot NIDDM with neuropathy  Plan: 1. Toenails 1-5 b/l were debrided in length and girth without iatrogenic laceration. Calluses pared submetatarsal head(s) 1 right foot utilizing sterile scalpel blade without incident. Continue soft, supportive shoe gear daily. Report any pedal injuries to medical professional. Follow up 3 months.  Patient/POA to call should there be a question/concern in there interim.

## 2019-06-21 ENCOUNTER — Ambulatory Visit (INDEPENDENT_AMBULATORY_CARE_PROVIDER_SITE_OTHER): Payer: Medicaid Other | Admitting: Primary Care

## 2019-06-22 DIAGNOSIS — E559 Vitamin D deficiency, unspecified: Secondary | ICD-10-CM | POA: Insufficient documentation

## 2019-06-22 DIAGNOSIS — D649 Anemia, unspecified: Secondary | ICD-10-CM | POA: Insufficient documentation

## 2019-06-22 DIAGNOSIS — R600 Localized edema: Secondary | ICD-10-CM | POA: Insufficient documentation

## 2019-06-22 DIAGNOSIS — M47816 Spondylosis without myelopathy or radiculopathy, lumbar region: Secondary | ICD-10-CM | POA: Insufficient documentation

## 2019-06-22 DIAGNOSIS — E785 Hyperlipidemia, unspecified: Secondary | ICD-10-CM | POA: Insufficient documentation

## 2019-06-26 DIAGNOSIS — Z6841 Body Mass Index (BMI) 40.0 and over, adult: Secondary | ICD-10-CM | POA: Diagnosis not present

## 2019-06-26 DIAGNOSIS — D649 Anemia, unspecified: Secondary | ICD-10-CM | POA: Diagnosis not present

## 2019-06-26 DIAGNOSIS — E785 Hyperlipidemia, unspecified: Secondary | ICD-10-CM | POA: Diagnosis not present

## 2019-06-26 DIAGNOSIS — E559 Vitamin D deficiency, unspecified: Secondary | ICD-10-CM | POA: Diagnosis not present

## 2019-06-26 DIAGNOSIS — I2510 Atherosclerotic heart disease of native coronary artery without angina pectoris: Secondary | ICD-10-CM | POA: Diagnosis not present

## 2019-06-26 DIAGNOSIS — N179 Acute kidney failure, unspecified: Secondary | ICD-10-CM | POA: Diagnosis not present

## 2019-06-26 DIAGNOSIS — I10 Essential (primary) hypertension: Secondary | ICD-10-CM | POA: Diagnosis not present

## 2019-06-26 DIAGNOSIS — R5383 Other fatigue: Secondary | ICD-10-CM | POA: Diagnosis not present

## 2019-06-26 DIAGNOSIS — E1140 Type 2 diabetes mellitus with diabetic neuropathy, unspecified: Secondary | ICD-10-CM | POA: Diagnosis not present

## 2019-06-26 DIAGNOSIS — I252 Old myocardial infarction: Secondary | ICD-10-CM | POA: Diagnosis not present

## 2019-06-26 DIAGNOSIS — Z794 Long term (current) use of insulin: Secondary | ICD-10-CM | POA: Diagnosis not present

## 2019-06-26 DIAGNOSIS — G4733 Obstructive sleep apnea (adult) (pediatric): Secondary | ICD-10-CM | POA: Diagnosis not present

## 2019-06-27 ENCOUNTER — Other Ambulatory Visit (INDEPENDENT_AMBULATORY_CARE_PROVIDER_SITE_OTHER): Payer: Self-pay | Admitting: Primary Care

## 2019-06-27 ENCOUNTER — Telehealth (INDEPENDENT_AMBULATORY_CARE_PROVIDER_SITE_OTHER): Payer: Self-pay

## 2019-06-27 DIAGNOSIS — G4733 Obstructive sleep apnea (adult) (pediatric): Secondary | ICD-10-CM

## 2019-06-27 NOTE — Telephone Encounter (Signed)
Patient called to check on status of CPAP machine. After reviewing chart, she needs CPAP titration ordered. Please place order. Nat Christen, CMA

## 2019-06-29 ENCOUNTER — Encounter (INDEPENDENT_AMBULATORY_CARE_PROVIDER_SITE_OTHER): Payer: Self-pay | Admitting: Primary Care

## 2019-06-29 ENCOUNTER — Other Ambulatory Visit: Payer: Self-pay

## 2019-06-29 ENCOUNTER — Ambulatory Visit (INDEPENDENT_AMBULATORY_CARE_PROVIDER_SITE_OTHER): Payer: Medicaid Other | Admitting: Primary Care

## 2019-06-29 DIAGNOSIS — Z760 Encounter for issue of repeat prescription: Secondary | ICD-10-CM

## 2019-06-29 DIAGNOSIS — G8929 Other chronic pain: Secondary | ICD-10-CM | POA: Diagnosis not present

## 2019-06-29 DIAGNOSIS — M5442 Lumbago with sciatica, left side: Secondary | ICD-10-CM

## 2019-06-29 DIAGNOSIS — M545 Low back pain: Secondary | ICD-10-CM

## 2019-06-29 DIAGNOSIS — E118 Type 2 diabetes mellitus with unspecified complications: Secondary | ICD-10-CM

## 2019-06-29 DIAGNOSIS — I10 Essential (primary) hypertension: Secondary | ICD-10-CM | POA: Diagnosis not present

## 2019-06-29 DIAGNOSIS — E1165 Type 2 diabetes mellitus with hyperglycemia: Secondary | ICD-10-CM

## 2019-06-29 DIAGNOSIS — F5101 Primary insomnia: Secondary | ICD-10-CM

## 2019-06-29 MED ORDER — AMLODIPINE BESYLATE 10 MG PO TABS: 10.0000 mg | ORAL_TABLET | Freq: Every day | ORAL | 0 refills | Status: DC

## 2019-06-29 MED ORDER — HUMULIN R U-500 (CONCENTRATED) 500 UNIT/ML ~~LOC~~ SOLN: SUBCUTANEOUS | 3 refills | Status: DC

## 2019-06-29 MED ORDER — HYDROCHLOROTHIAZIDE 25 MG PO TABS: 25.0000 mg | ORAL_TABLET | Freq: Every morning | ORAL | 3 refills | Status: DC

## 2019-06-29 MED ORDER — METHOCARBAMOL 750 MG PO TABS: 750.0000 mg | ORAL_TABLET | Freq: Every day | ORAL | 3 refills | Status: DC

## 2019-06-29 MED ORDER — MELATONIN 10 MG PO TABS: 3.0000 | ORAL_TABLET | Freq: Every day | ORAL | 2 refills | Status: DC

## 2019-06-29 MED ORDER — JARDIANCE 10 MG PO TABS: 10.0000 mg | ORAL_TABLET | Freq: Every day | ORAL | 3 refills | Status: DC

## 2019-06-29 NOTE — Progress Notes (Signed)
Pt feels the back pain more so on her left side. But the pain is in her lower mid back

## 2019-06-29 NOTE — Progress Notes (Signed)
Virtual Visit via Telephone Note  I connected with Lindsay [name] on 06/29/19 at  1:50 PM EDT by telephone and verified that I am speaking with the correct person using two identifiers.   I discussed the limitations, risks, security and privacy concerns of performing an evaluation and management service by telephone and the availability of in person appointments. I also discussed with the patient that there may be a patient responsible charge related to this service. The patient expressed understanding and agreed to proceed.   History of Present Illness: [name] is having increased back pain unknown etiology woke up 2 weeks ago unable to move or get out of the bed. Some of the back pain is due to weight but there maybe underlying causes.   Observations/Objective: Review of Systems  Musculoskeletal: Positive for back pain.  Psychiatric/Behavioral: The patient has insomnia.     Assessment and Plan: Diagnoses and all orders for this visit:  Chronic bilateral low back pain without sciatica -     Ambulatory referral to Orthopedic Surgery  Morbid obesity ([place]) Recommend decreasing carbohydrates , exercising 30 mins daily   Primary insomnia -     Melatonin 10 MG TABS; Take 3 tablets by mouth at bedtime.  Chronic midline low back pain with left-sided sciatica -     methocarbamol (ROBAXIN) 750 MG tablet; Take 1 tablet (750 mg total) by mouth at bedtime.  Poorly controlled type 2 diabetes mellitus with complication ([place]) ADA recommends the following therapeutic goals for glycemic control related to A1c measurements: Goal of therapy: Less than 6.5 hemoglobin A1c.  Reference clinical practice recommendations. Foods that are high in carbohydrates are the following rice, potatoes, breads, sugars, and pastas.  Reduction in the intake (eating) will assist in lowering your blood sugars. -     empagliflozin (JARDIANCE) 10 MG TABS tablet; Take 10 mg by mouth daily. -     insulin regular  human CONCENTRATED (HUMULIN R) 500 UNIT/ML injection; INJECT 70 UNITS 30 MINUTES BEFORE EACH MEAL  Essential hypertension Counseled on blood pressure goal of less than 130/80, low-sodium, DASH diet, medication compliance, 150 minutes of moderate intensity exercise per week. Discussed medication compliance, adverse effects.  Medication refill -     hydrochlorothiazide (HYDRODIURIL) 25 MG tablet; Take 1 tablet (25 mg total) by mouth every morning. -     amLODipine (NORVASC) 10 MG tablet; Take 1 tablet (10 mg total) by mouth daily.    Follow Up Instructions:    I discussed the assessment and treatment plan with the patient. The patient was provided an opportunity to ask questions and all were answered. The patient agreed with the plan and demonstrated an understanding of the instructions.   The patient was advised to call back or seek an in-person evaluation if the symptoms worsen or if the condition fails to improve as anticipated.  I provided 20 minutes of non-face-to-face time during this encounter.   [name], NP

## 2019-06-30 ENCOUNTER — Other Ambulatory Visit: Payer: Self-pay | Admitting: Primary Care

## 2019-06-30 DIAGNOSIS — R200 Anesthesia of skin: Secondary | ICD-10-CM

## 2019-07-03 NOTE — Telephone Encounter (Signed)
FWD to PCP. Tempestt S Roberts, CMA  

## 2019-07-06 ENCOUNTER — Ambulatory Visit: Payer: Self-pay

## 2019-07-06 ENCOUNTER — Encounter: Payer: Self-pay | Admitting: Family Medicine

## 2019-07-06 ENCOUNTER — Ambulatory Visit (INDEPENDENT_AMBULATORY_CARE_PROVIDER_SITE_OTHER): Payer: Medicaid Other | Admitting: Family Medicine

## 2019-07-06 ENCOUNTER — Other Ambulatory Visit: Payer: Self-pay

## 2019-07-06 DIAGNOSIS — Z7689 Persons encountering health services in other specified circumstances: Secondary | ICD-10-CM | POA: Diagnosis not present

## 2019-07-06 DIAGNOSIS — M545 Low back pain, unspecified: Secondary | ICD-10-CM

## 2019-07-06 MED ORDER — BACLOFEN 10 MG PO TABS: 5.0000 mg | ORAL_TABLET | Freq: Three times a day (TID) | ORAL | 1 refills | Status: DC | PRN

## 2019-07-06 NOTE — Progress Notes (Signed)
Office Visit Note   Patient: Lindsay [name]           Date of Birth: 06-06-1981           MRN: [id number] Visit Date: 07/06/2019 Requested by: [name], NP [id number] [place],  [place] [address] PCP: [name], NP  Subjective: Chief Complaint  Patient presents with  . Lower Back - Pain    Chronic low back pain. Worsened end of last month. Heard a "snap, crackle, & pop" when she started to get up from bed one morning. Pain radiates to mid-thigh, right leg more than left.    HPI: She is here with low back pain.  She has had chronic low back pain which has been manageable, but about a month ago she tried to get up and heard a snapping and popping sound.  Ever since then she has had severe pain in the midline with occasional radiation into the posterior hips.  She cannot find a comfortable position.  She has tried Tylenol, ibuprofen, and other over-the-counter remedies with no improvement.  Denies bowel or bladder dysfunction, has not had any weakness or numbness.  No previous compression fractures.              ROS: Denies fevers or chills.  All other systems were reviewed and are negative.  Objective: Vital Signs: LMP 06/14/2019 (Approximate)   Physical Exam:  General:  Alert and oriented, in no acute distress. Pulm:  Breathing unlabored. Psy:  Normal mood, congruent affect. Skin: No rash on her skin. Low back: Very tender to palpation in the midline throughout the lumbar spinous processes.  Negative straight leg raise, lower extremity strength and reflexes are normal.  Imaging: X-rays lumbar spine: She has degenerative disc disease at multiple levels but I do not see an acute compression fracture.    Assessment & Plan: 1.  Subacute midline low back pain, etiology uncertain.  Could be a disc protrusion or an occult compression fracture.  Neurologic exam is nonfocal. -Trial of baclofen.  Physical therapy referral.  If no improvement, she will contact  me for MRI imaging.     Procedures: No procedures performed  No notes on file     PMFS History: Patient Active Problem List   Diagnosis Date Noted  . OSA (obstructive sleep apnea) 11/03/2016  . Coronary artery disease involving native coronary artery of native heart with angina pectoris ([place]) 09/11/2016  . Diabetes mellitus with complication ([place])   . Community acquired pneumonia 09/07/2016  . MI, old   . DM neuropathy, type II diabetes mellitus ([place]) 06/10/2016  . Morbid obesity ([place]) 06/10/2016  . Pressure ulcer 05/17/2015  . 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 ARF (acute renal failure) ([place])   . Septic arthritis ([place])   . Essential hypertension 04/30/2015  . Cellulitis 04/30/2015  . Abscess of right shoulder    Past Medical History:  Diagnosis Date  . ARF (acute renal failure) ([place]) 04/2015  . Asthma   . Cellulitis of right upper extremity   . Coronary artery disease   . Diabetes mellitus    insulin dependent  . Hyperlipidemia LDL goal <70   . Hypertension   . NSTEMI (non-ST elevated myocardial infarction) ([place]) 08/2016  . Obesity   . S/P angioplasty with stent 08/2016   DES to mLAD and PTCA only to 2nd diag ostium.     Family History  Problem Relation Age of Onset  . Diabetes Mother   .  Hypertension Mother   . Heart disease Mother   . Diabetes Father   . Heart disease Father   . Stroke Maternal Grandmother   . Cancer Maternal Grandmother     Past Surgical History:  Procedure Laterality Date  . CARDIAC CATHETERIZATION N/A 09/07/2016   Procedure: Left Heart Cath and Coronary Angiography;  Surgeon: Lyn Records, MD;  Location: [facility] INVASIVE CV LAB;  Service: Cardiovascular;  Laterality: N/A;  . CARDIAC CATHETERIZATION N/A 09/07/2016   Procedure: Coronary Stent Intervention;  Surgeon: Lyn Records, MD;  Location: [facility] INVASIVE CV LAB;  Service: Cardiovascular;  Laterality: N/A;  . CARDIAC CATHETERIZATION N/A 09/07/2016   Procedure: Coronary Balloon Angioplasty;   Surgeon: Lyn Records, MD;  Location: [facility] INVASIVE CV LAB;  Service: Cardiovascular;  Laterality: N/A;  . CESAREAN SECTION    . IRRIGATION AND DEBRIDEMENT SHOULDER Right 04/30/2015   Procedure: IRRIGATION AND DEBRIDEMENT SHOULDER;  Surgeon: Sheral Apley, MD;  Location: [facility] OR;  Service: Orthopedics;  Laterality: Right;  . IRRIGATION AND DEBRIDEMENT SHOULDER Right 05/01/2015  . LEG SURGERY    . SHOULDER ARTHROSCOPY Right 04/30/2015   Procedure: ARTHROSCOPY SHOULDER;  Surgeon: Sheral Apley, MD;  Location: [facility] OR;  Service: Orthopedics;  Laterality: Right;  . TONSILLECTOMY     Social History   Occupational History  . Not on file  Tobacco Use  . Smoking status: Never Smoker  . Smokeless tobacco: Never Used  Substance and Sexual Activity  . Alcohol use: Yes    Comment: socially  . Drug use: No  . Sexual activity: Not on file

## 2019-07-08 ENCOUNTER — Emergency Department (HOSPITAL_COMMUNITY)
Admission: EM | Admit: 2019-07-08 | Discharge: 2019-07-08 | Disposition: A | Discharge status: Home / Self Care | Payer: Medicaid Other | Attending: Emergency Medicine | Admitting: Emergency Medicine

## 2019-07-08 ENCOUNTER — Other Ambulatory Visit: Payer: Self-pay

## 2019-07-08 DIAGNOSIS — I10 Essential (primary) hypertension: Secondary | ICD-10-CM | POA: Insufficient documentation

## 2019-07-08 DIAGNOSIS — E119 Type 2 diabetes mellitus without complications: Secondary | ICD-10-CM | POA: Insufficient documentation

## 2019-07-08 DIAGNOSIS — Z79899 Other long term (current) drug therapy: Secondary | ICD-10-CM | POA: Insufficient documentation

## 2019-07-08 DIAGNOSIS — L03012 Cellulitis of left finger: Secondary | ICD-10-CM | POA: Insufficient documentation

## 2019-07-08 DIAGNOSIS — I252 Old myocardial infarction: Secondary | ICD-10-CM | POA: Insufficient documentation

## 2019-07-08 DIAGNOSIS — Z794 Long term (current) use of insulin: Secondary | ICD-10-CM | POA: Diagnosis not present

## 2019-07-08 DIAGNOSIS — I25119 Atherosclerotic heart disease of native coronary artery with unspecified angina pectoris: Secondary | ICD-10-CM | POA: Diagnosis not present

## 2019-07-08 DIAGNOSIS — Z7982 Long term (current) use of aspirin: Secondary | ICD-10-CM | POA: Insufficient documentation

## 2019-07-08 DIAGNOSIS — M79645 Pain in left finger(s): Secondary | ICD-10-CM | POA: Diagnosis present

## 2019-07-08 MED ORDER — LIDOCAINE HCL 2 % IJ SOLN
20.0000 mL | Freq: Once | INTRAMUSCULAR | Status: AC
  Administered 2019-07-08: 400 mg
  Filled 2019-07-08: qty 20

## 2019-07-08 MED ORDER — DOXYCYCLINE HYCLATE 100 MG PO CAPS: 100.0000 mg | ORAL_CAPSULE | Freq: Two times a day (BID) | ORAL | 0 refills | Status: DC

## 2019-07-08 NOTE — ED Notes (Signed)
Patient verbalizes understanding of discharge instructions. Opportunity for questioning and answers were provided. Armband removed by staff, pt discharged from ED.  

## 2019-07-08 NOTE — ED Provider Notes (Signed)
[facility] EMERGENCY DEPARTMENT Provider Note   CSN: [id number] Arrival date & time: 07/08/19  1952    History   Chief Complaint Chief Complaint  Patient presents with  . Hand Pain    HPI Lindsay [name] is a 38 y.o. female.     The history is provided by the patient and medical records. No language interpreter was used.  Hand Pain   Lindsay [name] is a 38 y.o. female  with a PMH as listed below who presents to the Emergency Department complaining of left ring finger pain and swelling for 2 days.  Patient states that she had a hangnail about 2 days ago which she has been picking up.  She was washing dishes as well and felt as if it got a little more sore after doing such.  As the last 2 days have progressed, it has become more swollen and painful.  She reports history of similar in the past which had to be drained.    Past Medical History:  Diagnosis Date  . ARF (acute renal failure) ([place]) 04/2015  . Asthma   . Cellulitis of right upper extremity   . Coronary artery disease   . Diabetes mellitus    insulin dependent  . Hyperlipidemia LDL goal <70   . Hypertension   . NSTEMI (non-ST elevated myocardial infarction) ([place]) 08/2016  . Obesity   . S/P angioplasty with stent 08/2016   DES to mLAD and PTCA only to 2nd diag ostium.     Patient Active Problem List   Diagnosis Date Noted  . OSA (obstructive sleep apnea) 11/03/2016  . Coronary artery disease involving native coronary artery of native heart with angina pectoris ([place]) 09/11/2016  . Diabetes mellitus with complication ([place])   . Community acquired pneumonia 09/07/2016  . MI, old   . DM neuropathy, type II diabetes mellitus ([place]) 06/10/2016  . Morbid obesity ([place]) 06/10/2016  . Pressure ulcer 05/17/2015  . 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 ARF (acute renal failure) ([place])   . Septic arthritis ([place])   . Essential hypertension 04/30/2015  . Cellulitis 04/30/2015  . Abscess of right shoulder      Past Surgical History:  Procedure Laterality Date  . CARDIAC CATHETERIZATION N/A 09/07/2016   Procedure: Left Heart Cath and Coronary Angiography;  Surgeon: [name], MD;  Location: [facility];  Service: Cardiovascular;  Laterality: N/A;  . CARDIAC CATHETERIZATION N/A 09/07/2016   Procedure: Coronary Stent Intervention;  Surgeon: [name], MD;  Location: [facility];  Service: Cardiovascular;  Laterality: N/A;  . CARDIAC CATHETERIZATION N/A 09/07/2016   Procedure: Coronary Balloon Angioplasty;  Surgeon: [name], MD;  Location: [facility];  Service: Cardiovascular;  Laterality: N/A;  . CESAREAN SECTION    . IRRIGATION AND DEBRIDEMENT SHOULDER Right 04/30/2015   Procedure: IRRIGATION AND DEBRIDEMENT SHOULDER;  Surgeon: Renette Butters, MD;  Location: [facility];  Service: Orthopedics;  Laterality: Right;  . IRRIGATION AND DEBRIDEMENT SHOULDER Right 05/01/2015  . LEG SURGERY    . SHOULDER ARTHROSCOPY Right 04/30/2015   Procedure: ARTHROSCOPY SHOULDER;  Surgeon: Renette Butters, MD;  Location: [facility];  Service: Orthopedics;  Laterality: Right;  . TONSILLECTOMY       OB History   No obstetric history on file.      Home Medications    Prior to Admission medications   Medication Sig Start Date End Date Taking? Authorizing Provider  albuterol (PROVENTIL  HFA) 108 (90 Base) MCG/ACT inhaler INHALE 2 PUFFS BY MOUTH EVERY 6 HOURS AS NEEDED FOR WHEEZING OR SHORTNESS OF BREATH 04/13/19   [name], NP  amLODipine (NORVASC) 10 MG tablet Take 1 tablet (10 mg total) by mouth daily. 06/29/19   [name], NP  aspirin 81 MG tablet Take 1 tablet (81 mg total) by mouth daily. 01/13/18   Clent Demark, PA-C  aspirin-acetaminophen-caffeine (EXCEDRIN MIGRAINE) [phone] MG tablet Take 2 tablets by mouth every 6 (six) hours as needed for headache.    [provider]  atorvastatin (LIPITOR) 80 MG tablet Take 1 tablet (80 mg total) by mouth daily at  6 PM. Please call and schedule November/December follow up. [phone] 01/11/19   [name], NP  b complex vitamins tablet Take 1 tablet by mouth daily.    [provider]  baclofen (LIORESAL) 10 MG tablet Take 0.5-1 tablets (5-10 mg total) by mouth 3 (three) times daily as needed for muscle spasms. 07/06/19   Hilts, Legrand Como, MD  BD VEO INSULIN SYRINGE U/F 31G X 15/64" 1 ML MISC USE  SYRINGE ONCE DAILY 06/07/18   Elayne Snare, MD  Blood Glucose Monitoring Suppl (ACCU-CHEK AVIVA PLUS) w/Device KIT 1 each by Does not apply route as directed. 04/15/18   Elayne Snare, MD  doxycycline (VIBRAMYCIN) 100 MG capsule Take 1 capsule (100 mg total) by mouth 2 (two) times daily. 07/08/19   [name], Ozella Almond, PA-C  DULoxetine (CYMBALTA) 30 MG capsule TAKE 1 CAPSULE BY MOUTH  DAILY 07/03/19   [name], NP  empagliflozin (JARDIANCE) 10 MG TABS tablet Take 10 mg by mouth daily. 06/29/19   [name], NP  ezetimibe (ZETIA) 10 MG tablet Take 1 tablet (10 mg total) by mouth daily. 04/13/19   [name], NP  gabapentin (NEURONTIN) 300 MG capsule TAKE 2 CAPSULES BY MOUTH THREE TIMES DAILY 06/11/19   [name], NP  glucose blood (ACCU-CHEK AVIVA PLUS) test strip Use as instructed to test blood sugar 4 times daily. 01/11/19   [name], NP  hydrochlorothiazide (HYDRODIURIL) 25 MG tablet Take 1 tablet (25 mg total) by mouth every morning. 06/29/19   [name], NP  hydrocortisone 2.5 % ointment Apply topically for rash on face up to twice daily as needed. 07/26/18   [provider]  ibuprofen (ADVIL,MOTRIN) 200 MG tablet Take 400 mg by mouth every 6 (six) hours as needed for fever or moderate pain.    [provider]  Insulin Glargine (LANTUS SOLOSTAR) 100 UNIT/ML Solostar Pen Inject 50 Units into the skin at bedtime. 01/11/19 04/11/19  [name], NP  Insulin Pen Needle (B-D UF III MINI PEN NEEDLES) 31G X 5 MM MISC Use as instructed. Monitor  blood glucose levels three times per day 04/13/19   [name], NP  insulin regular human CONCENTRATED (HUMULIN R) 500 UNIT/ML injection INJECT 70 UNITS 30 MINUTES BEFORE EACH MEAL 06/29/19   [name], NP  ketoconazole (NIZORAL) 2 % shampoo Use as a shampoo at least once weekly. 07/26/18   [provider]  losartan (COZAAR) 100 MG tablet Take 1 tablet (100 mg total) by mouth daily. 01/12/19   [name], MD  Melatonin 10 MG TABS Take 3 tablets by mouth at bedtime. 06/29/19   [name], NP  methocarbamol (ROBAXIN) 750 MG tablet Take 1 tablet (750 mg total) by mouth at bedtime. 06/29/19   [name], NP  metoprolol tartrate (LOPRESSOR) 50 MG tablet Take 1 tablet (50 mg total) by mouth 2 (two) times daily. 01/11/19   [name], NP  Multiple Vitamins-Minerals (MULTIVITAMIN PO) Take 1 tablet by mouth daily.    [provider]  mupirocin ointment (BACTROBAN) 2 % Apply twice daily to the affected area x 7 days or until healed 01/24/19   Fulp, Cammie, MD  nitroGLYCERIN (NITROSTAT) 0.4 MG SL tablet Place 1 tablet (0.4 mg total) under the tongue every 5 (five) minutes as needed for chest pain. 01/13/18   Clent Demark, PA-C    Family History Family History  Problem Relation Age of Onset  . Diabetes Mother   . Hypertension Mother   . Heart disease Mother   . Diabetes Father   . Heart disease Father   . Stroke Maternal Grandmother   . Cancer Maternal Grandmother     Social History Social History   Tobacco Use  . Smoking status: Never Smoker  . Smokeless tobacco: Never Used  Substance Use Topics  . Alcohol use: Yes    Comment: socially  . Drug use: No     Allergies   Hydrazine yellow [tartrazine], Lisinopril, and Tylenol [acetaminophen]   Review of Systems Review of Systems  Constitutional: Negative for fever.  Musculoskeletal: Positive for myalgias. Negative for arthralgias.  Skin: Positive for wound.  Neurological: Negative  for weakness and numbness.     Physical Exam Updated Vital Signs BP (!) 155/99   Pulse 88   Temp 98.8 F (37.1 C) (Oral)   LMP 06/14/2019 (Approximate)   SpO2 93%   Physical Exam Vitals signs and nursing note reviewed.  Constitutional:      General: She is not in acute distress.    Appearance: She is well-developed.  HENT:     Head: Normocephalic and atraumatic.  Neck:     Musculoskeletal: Neck supple.  Cardiovascular:     Rate and Rhythm: Normal rate and regular rhythm.     Heart sounds: Normal heart sounds. No murmur.  Pulmonary:     Effort: Pulmonary effort is normal. No respiratory distress.     Breath sounds: Normal breath sounds. No wheezing or rales.  Musculoskeletal: Normal range of motion.  Skin:    General: Skin is warm and dry.     Comments: Small paronychia to ulnar aspect of left ring finger   Neurological:     Mental Status: She is alert.      ED Treatments / Results  Labs (all labs ordered are listed, but only abnormal results are displayed) Labs Reviewed - No data to display  EKG None  Radiology No results found.  Procedures .Nerve Block  Date/Time: 07/08/2019 9:06 PM Performed by: Tayvia Faughnan, Ozella Almond, PA-C Authorized by: Jaslyn Bansal, Ozella Almond, PA-C   Consent:    Consent obtained:  Verbal   Consent given by:  Patient   Risks discussed:  Infection, bleeding, swelling, unsuccessful block and pain   Alternatives discussed:  No treatment Location:    Body area:  Upper extremity   Upper extremity nerve blocked: Digital.   Laterality:  Left Pre-procedure details:    Skin preparation:  Alcohol Procedure details (see MAR for exact dosages):    Block needle gauge:  27 G   Anesthetic injected:  Lidocaine 2% w/o epi   Injection procedure:  Anatomic landmarks identified, introduced needle, negative aspiration for blood and incremental injection Post-procedure details:    Outcome:  Anesthesia achieved   Patient tolerance of procedure:  Tolerated well, no immediate complications .Marland KitchenIncision and Drainage  Date/Time: 07/08/2019 9:07 PM Performed by: Jordanna Hendrie, Ozella Almond, PA-C Authorized by: Hung Rhinesmith, Ozella Almond, PA-C   Consent:    Consent obtained:  Verbal   Consent given by:  Patient   Risks discussed:  Bleeding, incomplete drainage, pain and infection Location:    Indications for incision and drainage: Paronychia.   Location:  Upper extremity   Upper extremity location:  Finger   Finger location:  L ring finger Anesthesia (see MAR for exact dosages):    Anesthesia method:  Nerve block Procedure details:    Scalpel blade:  10   Drainage:  Purulent   Drainage amount:  Moderate   Wound treatment:  Wound left open Post-procedure details:    Patient tolerance of procedure:  Tolerated well, no immediate complications   (including critical care time)  Medications Ordered in ED Medications  lidocaine (XYLOCAINE) 2 % (with pres) injection 400 mg (400 mg Infiltration Given 07/08/19 2108)     Initial Impression / Assessment and Plan / ED Course  I have reviewed the triage vital signs and the nursing notes.  Pertinent labs & imaging results that were available during my care of the patient were reviewed by me and considered in my medical decision making (see chart for details).       Lindsay [name] is a 38 y.o. female who presents to ED for paronychia to the left finger.  Has had this before and noted that it was quite painful for her when done without nerve block, therefore did proceed to digital block which he tolerated well.  Drained without complication.  Started on antibiotics.  Encouraged Epson salt soaks.  Home care instructions and return precautions were discussed and all questions answered.   Final Clinical Impressions(s) / ED Diagnoses   Final diagnoses:  Paronychia of finger of left hand    ED Discharge Orders         Ordered    doxycycline (VIBRAMYCIN) 100 MG capsule  2 times daily     07/08/19  2105           Squire Withey, Ozella Almond, PA-C 07/08/19 2108    Elnora Morrison, MD 07/09/19 [phone]

## 2019-07-08 NOTE — Discharge Instructions (Signed)
It was my pleasure taking care of you today!   Please take all of your antibiotics until finished!   Soak hand in Epson salt and water for about 10 minutes twice a day.  Return to the emergency department for new or worsening symptoms, any additional concerns.

## 2019-07-08 NOTE — ED Triage Notes (Signed)
Per pt she was washing dishes and has a hang nail on her left ring finger. Pt said it is swollen on the cuticle are. Sore to touch.

## 2019-07-10 ENCOUNTER — Other Ambulatory Visit (HOSPITAL_COMMUNITY)
Admission: RE | Admit: 2019-07-10 | Discharge: 2019-07-10 | Disposition: A | Discharge status: Home / Self Care | Payer: Medicaid Other | Source: Ambulatory Visit | Attending: Internal Medicine | Admitting: Internal Medicine

## 2019-07-10 DIAGNOSIS — Z1159 Encounter for screening for other viral diseases: Secondary | ICD-10-CM | POA: Insufficient documentation

## 2019-07-10 LAB — SARS CORONAVIRUS 2 (TAT 6-24 HRS): SARS Coronavirus 2: NEGATIVE

## 2019-07-13 ENCOUNTER — Other Ambulatory Visit: Payer: Self-pay

## 2019-07-13 ENCOUNTER — Ambulatory Visit (HOSPITAL_BASED_OUTPATIENT_CLINIC_OR_DEPARTMENT_OTHER): Payer: Medicaid Other | Attending: Primary Care | Admitting: Internal Medicine

## 2019-07-13 DIAGNOSIS — R32 Unspecified urinary incontinence: Secondary | ICD-10-CM | POA: Insufficient documentation

## 2019-07-13 DIAGNOSIS — Z79899 Other long term (current) drug therapy: Secondary | ICD-10-CM | POA: Diagnosis not present

## 2019-07-13 DIAGNOSIS — G4733 Obstructive sleep apnea (adult) (pediatric): Secondary | ICD-10-CM | POA: Diagnosis not present

## 2019-07-14 DIAGNOSIS — Z7689 Persons encountering health services in other specified circumstances: Secondary | ICD-10-CM | POA: Diagnosis not present

## 2019-07-16 ENCOUNTER — Other Ambulatory Visit (INDEPENDENT_AMBULATORY_CARE_PROVIDER_SITE_OTHER): Payer: Self-pay | Admitting: Primary Care

## 2019-07-16 DIAGNOSIS — G4733 Obstructive sleep apnea (adult) (pediatric): Secondary | ICD-10-CM

## 2019-07-16 NOTE — Procedures (Signed)
Patient Name: Lindsay [name], Lindsay [name] Date: 07/13/2019 Gender: Female D.O.B: 07-20-1981 Age (years): 37 Referring Provider: [name] NP Height (inches): 67 Interpreting Physician: Baird Lyons MD, ABSM Weight (lbs): 403 RPSGT: Laren Everts BMI: 63 MRN: [id number] Neck Size: 19.00  CLINICAL INFORMATION The patient is referred for a CPAP titration to treat sleep apnea.  Date of NPSG, Split Night or HST: NPSG 05/27/2019   AHI 36.3/ hr, desaturation to 52%, body weight 403 lbs  SLEEP [name] TECHNIQUE As per the AASM Manual for the Scoring of Sleep and Associated Events v2.3 (April 2016) with a hypopnea requiring 4% desaturations.  The channels recorded and monitored were frontal, central and occipital EEG, electrooculogram (EOG), submentalis EMG (chin), nasal and oral airflow, thoracic and abdominal wall motion, anterior tibialis EMG, snore microphone, electrocardiogram, and pulse oximetry. Continuous positive airway pressure (CPAP) was initiated at the beginning of the [name] and titrated to treat sleep-disordered breathing.  MEDICATIONS Medications self-administered by patient taken the night of the [name] : GABAPENTIN, BACLOFEN  TECHNICIAN COMMENTS Comments added by technician: NONE Comments added by scorer: N/A  RESPIRATORY PARAMETERS Optimal PAP Pressure (cm): 22 AHI at Optimal Pressure (/hr): 3.9 Overall Minimal O2 (%): 51.0 Supine % at Optimal Pressure (%): 0 Minimal O2 at Optimal Pressure (%): 90.0   SLEEP ARCHITECTURE The [name] was initiated at 10:52:31 PM and ended at 5:29:31 AM.  Sleep onset time was 0.8 minutes and the sleep efficiency was 90.3%%. The total sleep time was 358.5 minutes.  The patient spent 1.7%% of the night in stage N1 sleep, 60.8%% in stage N2 sleep, 0.0%% in stage N3 and 37.5% in REM.Stage REM latency was 17.0 minutes  Wake after sleep onset was 37.7. Alpha intrusion was absent. Supine sleep was 70.99%.  CARDIAC DATA The 2 lead EKG  demonstrated sinus rhythm. The mean heart rate was 83.1 beats per minute. Other EKG findings include: None.  LEG MOVEMENT DATA The total Periodic Limb Movements of Sleep (PLMS) were 0. The PLMS index was 0.0. A PLMS index of <15 is considered normal in adults.  IMPRESSIONS - The optimal PAP pressure was 22 cm of water. - Central sleep apnea was not noted during this titration (CAI = 2.3/h). - Severe oxygen desaturations were observed during this titration (min O2 = 51.0%). -  Min sat at CPAP 22 was 90%. - The patient snored with moderate snoring volume during this titration [name]. - No cardiac abnormalities were observed during this [name]. - Clinically significant periodic limb movements were not noted during this [name]. Arousals associated with PLMs were rare. - Urinary incontinence noted by tech.  DIAGNOSIS - Obstructive Sleep Apnea (327.23 [G47.33 ICD-10])  RECOMMENDATIONS - Trial of CPAP therapy on 22 cm H2O if available- discuss with DME supplier. Most CPAP machines provide max of 20 cwp. Options may include trial of autopap 15-20, or BIPAP 22/ 15 with Pressure Support (PS) of 2. - Patient used a Small size Resmed Full Face Mask AirFit F20 mask and heated humidification. - Be careful with alcohol, sedatives and other CNS depressants that may worsen sleep apnea and disrupt normal sleep architecture. - Sleep hygiene should be reviewed to assess factors that may improve sleep quality. - Weight management and regular exercise should be initiated or continued.  [Electronically signed] 07/16/2019 12:03 PM  Baird Lyons MD, [place], [place] Board of Sleep Medicine   NPI: [id number]  Jetty Duhamel Diplomate, Biomedical engineer of Sleep Medicine  ELECTRONICALLY SIGNED ON:  07/16/2019, 11:59 AM [facility] PH: [phone]   FX: [phone] ACCREDITED BY THE AMERICAN ACADEMY OF SLEEP MEDICINE

## 2019-07-17 ENCOUNTER — Encounter (HOSPITAL_BASED_OUTPATIENT_CLINIC_OR_DEPARTMENT_OTHER): Payer: Medicaid Other | Attending: Internal Medicine

## 2019-07-17 DIAGNOSIS — E1142 Type 2 diabetes mellitus with diabetic polyneuropathy: Secondary | ICD-10-CM | POA: Diagnosis not present

## 2019-07-17 DIAGNOSIS — L97811 Non-pressure chronic ulcer of other part of right lower leg limited to breakdown of skin: Secondary | ICD-10-CM | POA: Diagnosis not present

## 2019-07-17 DIAGNOSIS — I252 Old myocardial infarction: Secondary | ICD-10-CM | POA: Diagnosis not present

## 2019-07-17 DIAGNOSIS — I890 Lymphedema, not elsewhere classified: Secondary | ICD-10-CM | POA: Insufficient documentation

## 2019-07-17 DIAGNOSIS — L97219 Non-pressure chronic ulcer of right calf with unspecified severity: Secondary | ICD-10-CM | POA: Diagnosis not present

## 2019-07-17 DIAGNOSIS — I110 Hypertensive heart disease with heart failure: Secondary | ICD-10-CM | POA: Insufficient documentation

## 2019-07-17 DIAGNOSIS — Z6841 Body Mass Index (BMI) 40.0 and over, adult: Secondary | ICD-10-CM | POA: Insufficient documentation

## 2019-07-17 DIAGNOSIS — E1151 Type 2 diabetes mellitus with diabetic peripheral angiopathy without gangrene: Secondary | ICD-10-CM | POA: Diagnosis not present

## 2019-07-17 DIAGNOSIS — I509 Heart failure, unspecified: Secondary | ICD-10-CM | POA: Insufficient documentation

## 2019-07-17 DIAGNOSIS — I872 Venous insufficiency (chronic) (peripheral): Secondary | ICD-10-CM | POA: Diagnosis not present

## 2019-07-17 DIAGNOSIS — Z794 Long term (current) use of insulin: Secondary | ICD-10-CM | POA: Insufficient documentation

## 2019-07-17 DIAGNOSIS — I2510 Atherosclerotic heart disease of native coronary artery without angina pectoris: Secondary | ICD-10-CM | POA: Insufficient documentation

## 2019-07-17 DIAGNOSIS — E11622 Type 2 diabetes mellitus with other skin ulcer: Secondary | ICD-10-CM | POA: Insufficient documentation

## 2019-07-23 ENCOUNTER — Other Ambulatory Visit (INDEPENDENT_AMBULATORY_CARE_PROVIDER_SITE_OTHER): Payer: Self-pay | Admitting: Primary Care

## 2019-07-23 DIAGNOSIS — E1140 Type 2 diabetes mellitus with diabetic neuropathy, unspecified: Secondary | ICD-10-CM

## 2019-07-23 DIAGNOSIS — Z794 Long term (current) use of insulin: Secondary | ICD-10-CM

## 2019-07-24 ENCOUNTER — Encounter (HOSPITAL_BASED_OUTPATIENT_CLINIC_OR_DEPARTMENT_OTHER): Payer: Medicaid Other | Attending: Internal Medicine

## 2019-07-24 DIAGNOSIS — I509 Heart failure, unspecified: Secondary | ICD-10-CM | POA: Insufficient documentation

## 2019-07-24 DIAGNOSIS — L97212 Non-pressure chronic ulcer of right calf with fat layer exposed: Secondary | ICD-10-CM | POA: Diagnosis not present

## 2019-07-24 DIAGNOSIS — E11622 Type 2 diabetes mellitus with other skin ulcer: Secondary | ICD-10-CM | POA: Insufficient documentation

## 2019-07-24 DIAGNOSIS — I872 Venous insufficiency (chronic) (peripheral): Secondary | ICD-10-CM | POA: Diagnosis not present

## 2019-07-24 DIAGNOSIS — I252 Old myocardial infarction: Secondary | ICD-10-CM | POA: Insufficient documentation

## 2019-07-24 DIAGNOSIS — E1142 Type 2 diabetes mellitus with diabetic polyneuropathy: Secondary | ICD-10-CM | POA: Diagnosis not present

## 2019-07-24 DIAGNOSIS — I2510 Atherosclerotic heart disease of native coronary artery without angina pectoris: Secondary | ICD-10-CM | POA: Diagnosis not present

## 2019-07-24 DIAGNOSIS — I110 Hypertensive heart disease with heart failure: Secondary | ICD-10-CM | POA: Diagnosis not present

## 2019-07-24 DIAGNOSIS — I890 Lymphedema, not elsewhere classified: Secondary | ICD-10-CM | POA: Insufficient documentation

## 2019-07-24 DIAGNOSIS — L97812 Non-pressure chronic ulcer of other part of right lower leg with fat layer exposed: Secondary | ICD-10-CM | POA: Insufficient documentation

## 2019-07-24 DIAGNOSIS — Z7689 Persons encountering health services in other specified circumstances: Secondary | ICD-10-CM | POA: Diagnosis not present

## 2019-07-26 DIAGNOSIS — E1140 Type 2 diabetes mellitus with diabetic neuropathy, unspecified: Secondary | ICD-10-CM | POA: Diagnosis not present

## 2019-07-26 DIAGNOSIS — Z794 Long term (current) use of insulin: Secondary | ICD-10-CM | POA: Diagnosis not present

## 2019-07-26 DIAGNOSIS — E1165 Type 2 diabetes mellitus with hyperglycemia: Secondary | ICD-10-CM | POA: Diagnosis not present

## 2019-07-27 ENCOUNTER — Ambulatory Visit (INDEPENDENT_AMBULATORY_CARE_PROVIDER_SITE_OTHER): Payer: Medicaid Other | Admitting: Family Medicine

## 2019-07-27 ENCOUNTER — Encounter: Payer: Self-pay | Admitting: Family Medicine

## 2019-07-27 DIAGNOSIS — M545 Low back pain, unspecified: Secondary | ICD-10-CM

## 2019-07-27 DIAGNOSIS — Z7689 Persons encountering health services in other specified circumstances: Secondary | ICD-10-CM | POA: Diagnosis not present

## 2019-07-27 NOTE — Progress Notes (Signed)
Office Visit Note   Patient: Lindsay [name]           Date of Birth: 1981/05/06           MRN: [id number] Visit Date: 07/27/2019 Requested by: Grayce Sessions, NP [address] [place],  [place] [address] PCP: Grayce Sessions, NP  Subjective: Chief Complaint  Patient presents with  . Lower Back - Pain    Pinching pain in the left hip, with numbness in the left leg x 1 week. Pain in the right buttock (aches). Continued pain in the right knee. Has not started PT yet.    HPI: She is here with persistent low back pain and now with left leg numbness.  For the past week her leg gets numb every time she stands up.  The numbness continues when lying down, but it is better if she can position herself with pillows propped in a certain way.  She has not yet started physical therapy.  She continues to have severe low back pain with occasional crunching and popping sounds when she moves.  Denies any bowel or bladder incontinence.              ROS: No fevers or chills.  All other systems were reviewed and are negative.  Objective: Vital Signs: There were no vitals taken for this visit.  Physical Exam:  General:  Alert and oriented, in no acute distress. Pulm:  Breathing unlabored. Psy:  Normal mood, congruent affect. Skin: No rash on her skin. Low back: Remains tender to palpation L5-S1.  Pain in the left sciatic notch.  Negative straight leg raise, lower extremity strength and reflexes are still normal.  Imaging: None today.  Assessment & Plan: 1.  Worsening low back pain with left leg numbness, concerning for disc protrusion -MRI scan to evaluate.  Proceed with physical therapy.  She will call for refills when needed.  Consider possible epidural steroid injection depending on MRI findings. -She does have a cardiac stent, might not be able to have MRI scan.  If not, we will proceed with conservative therapy and if necessary, CT myelogram for additional imaging.      Procedures: No procedures performed  No notes on file     PMFS History: Patient Active Problem List   Diagnosis Date Noted  . OSA (obstructive sleep apnea) 11/03/2016  . Coronary artery disease involving native coronary artery of native heart with angina pectoris ([facility]) 09/11/2016  . Diabetes mellitus with complication ([facility])   . Community acquired pneumonia 09/07/2016  . MI, old   . DM neuropathy, type II diabetes mellitus ([facility]) 06/10/2016  . Morbid obesity ([facility]) 06/10/2016  . Pressure ulcer 05/17/2015  . Anaerobic abscess ([facility])   . ARF (acute renal failure) ([facility])   . Septic arthritis ([facility])   . Essential hypertension 04/30/2015  . Cellulitis 04/30/2015  . Abscess of right shoulder    Past Medical History:  Diagnosis Date  . ARF (acute renal failure) ([facility]) 04/2015  . Asthma   . Cellulitis of right upper extremity   . Coronary artery disease   . Diabetes mellitus    insulin dependent  . Hyperlipidemia LDL goal <70   . Hypertension   . NSTEMI (non-ST elevated myocardial infarction) ([facility]) 08/2016  . Obesity   . S/P angioplasty with stent 08/2016   DES to mLAD and PTCA only to 2nd diag ostium.     Family History  Problem Relation Age of Onset  . Diabetes Mother   .  Hypertension Mother   . Heart disease Mother   . Diabetes Father   . Heart disease Father   . Stroke Maternal Grandmother   . Cancer Maternal Grandmother     Past Surgical History:  Procedure Laterality Date  . CARDIAC CATHETERIZATION N/A 09/07/2016   Procedure: Left Heart Cath and Coronary Angiography;  Surgeon: [name], MD;  Location: [facility];  Service: Cardiovascular;  Laterality: N/A;  . CARDIAC CATHETERIZATION N/A 09/07/2016   Procedure: Coronary Stent Intervention;  Surgeon: [name], MD;  Location: [facility];  Service: Cardiovascular;  Laterality: N/A;  . CARDIAC CATHETERIZATION N/A 09/07/2016   Procedure: Coronary Balloon Angioplasty;  Surgeon: [name], MD;   Location: [facility];  Service: Cardiovascular;  Laterality: N/A;  . CESAREAN SECTION    . IRRIGATION AND DEBRIDEMENT SHOULDER Right 04/30/2015   Procedure: IRRIGATION AND DEBRIDEMENT SHOULDER;  Surgeon: Renette Butters, MD;  Location: [facility];  Service: Orthopedics;  Laterality: Right;  . IRRIGATION AND DEBRIDEMENT SHOULDER Right 05/01/2015  . LEG SURGERY    . SHOULDER ARTHROSCOPY Right 04/30/2015   Procedure: ARTHROSCOPY SHOULDER;  Surgeon: Renette Butters, MD;  Location: [facility];  Service: Orthopedics;  Laterality: Right;  . TONSILLECTOMY     Social History   Occupational History  . Not on file  Tobacco Use  . Smoking status: Never Smoker  . Smokeless tobacco: Never Used  Substance and Sexual Activity  . Alcohol use: Yes    Comment: socially  . Drug use: No  . Sexual activity: Not on file

## 2019-07-31 DIAGNOSIS — I509 Heart failure, unspecified: Secondary | ICD-10-CM | POA: Diagnosis not present

## 2019-07-31 DIAGNOSIS — Z7689 Persons encountering health services in other specified circumstances: Secondary | ICD-10-CM | POA: Diagnosis not present

## 2019-07-31 DIAGNOSIS — I872 Venous insufficiency (chronic) (peripheral): Secondary | ICD-10-CM | POA: Diagnosis not present

## 2019-07-31 DIAGNOSIS — L97219 Non-pressure chronic ulcer of right calf with unspecified severity: Secondary | ICD-10-CM | POA: Diagnosis not present

## 2019-07-31 DIAGNOSIS — L97812 Non-pressure chronic ulcer of other part of right lower leg with fat layer exposed: Secondary | ICD-10-CM | POA: Diagnosis not present

## 2019-07-31 DIAGNOSIS — I252 Old myocardial infarction: Secondary | ICD-10-CM | POA: Diagnosis not present

## 2019-07-31 DIAGNOSIS — I890 Lymphedema, not elsewhere classified: Secondary | ICD-10-CM | POA: Diagnosis not present

## 2019-07-31 DIAGNOSIS — I2510 Atherosclerotic heart disease of native coronary artery without angina pectoris: Secondary | ICD-10-CM | POA: Diagnosis not present

## 2019-07-31 DIAGNOSIS — I110 Hypertensive heart disease with heart failure: Secondary | ICD-10-CM | POA: Diagnosis not present

## 2019-07-31 DIAGNOSIS — E11622 Type 2 diabetes mellitus with other skin ulcer: Secondary | ICD-10-CM | POA: Diagnosis not present

## 2019-07-31 DIAGNOSIS — E1142 Type 2 diabetes mellitus with diabetic polyneuropathy: Secondary | ICD-10-CM | POA: Diagnosis not present

## 2019-08-02 ENCOUNTER — Telehealth (INDEPENDENT_AMBULATORY_CARE_PROVIDER_SITE_OTHER): Payer: Self-pay

## 2019-08-02 NOTE — Telephone Encounter (Signed)
Patient called wanting to know result of her sleep study and also wanted to know when she would be getting her CPAP machines. Patient would also like to know if her results for covid-19 are available.   Please advice [phone]  Thank you Lindsay [name]

## 2019-08-03 DIAGNOSIS — Z7689 Persons encountering health services in other specified circumstances: Secondary | ICD-10-CM | POA: Diagnosis not present

## 2019-08-04 NOTE — Telephone Encounter (Signed)
Patient is aware that covid result was negative. Patient informed that sleep study did reveal sleep apnea and need for CPAP. Scheduled patient for OV on 8/25 to discuss OSA so that notes and order and can be sent to [facility] to get her setup with CPAP machine and supplies. Nat Christen, CMA

## 2019-08-07 DIAGNOSIS — I252 Old myocardial infarction: Secondary | ICD-10-CM | POA: Diagnosis not present

## 2019-08-07 DIAGNOSIS — L97212 Non-pressure chronic ulcer of right calf with fat layer exposed: Secondary | ICD-10-CM | POA: Diagnosis not present

## 2019-08-07 DIAGNOSIS — L97812 Non-pressure chronic ulcer of other part of right lower leg with fat layer exposed: Secondary | ICD-10-CM | POA: Diagnosis not present

## 2019-08-07 DIAGNOSIS — E1142 Type 2 diabetes mellitus with diabetic polyneuropathy: Secondary | ICD-10-CM | POA: Diagnosis not present

## 2019-08-07 DIAGNOSIS — Z7689 Persons encountering health services in other specified circumstances: Secondary | ICD-10-CM | POA: Diagnosis not present

## 2019-08-07 DIAGNOSIS — I890 Lymphedema, not elsewhere classified: Secondary | ICD-10-CM | POA: Diagnosis not present

## 2019-08-07 DIAGNOSIS — I2510 Atherosclerotic heart disease of native coronary artery without angina pectoris: Secondary | ICD-10-CM | POA: Diagnosis not present

## 2019-08-07 DIAGNOSIS — I110 Hypertensive heart disease with heart failure: Secondary | ICD-10-CM | POA: Diagnosis not present

## 2019-08-07 DIAGNOSIS — E11622 Type 2 diabetes mellitus with other skin ulcer: Secondary | ICD-10-CM | POA: Diagnosis not present

## 2019-08-07 DIAGNOSIS — I872 Venous insufficiency (chronic) (peripheral): Secondary | ICD-10-CM | POA: Diagnosis not present

## 2019-08-07 DIAGNOSIS — I509 Heart failure, unspecified: Secondary | ICD-10-CM | POA: Diagnosis not present

## 2019-08-14 DIAGNOSIS — I2510 Atherosclerotic heart disease of native coronary artery without angina pectoris: Secondary | ICD-10-CM | POA: Diagnosis not present

## 2019-08-14 DIAGNOSIS — L97812 Non-pressure chronic ulcer of other part of right lower leg with fat layer exposed: Secondary | ICD-10-CM | POA: Diagnosis not present

## 2019-08-14 DIAGNOSIS — I509 Heart failure, unspecified: Secondary | ICD-10-CM | POA: Diagnosis not present

## 2019-08-14 DIAGNOSIS — E11622 Type 2 diabetes mellitus with other skin ulcer: Secondary | ICD-10-CM | POA: Diagnosis not present

## 2019-08-14 DIAGNOSIS — E1142 Type 2 diabetes mellitus with diabetic polyneuropathy: Secondary | ICD-10-CM | POA: Diagnosis not present

## 2019-08-14 DIAGNOSIS — L97212 Non-pressure chronic ulcer of right calf with fat layer exposed: Secondary | ICD-10-CM | POA: Diagnosis not present

## 2019-08-14 DIAGNOSIS — I890 Lymphedema, not elsewhere classified: Secondary | ICD-10-CM | POA: Diagnosis not present

## 2019-08-14 DIAGNOSIS — I872 Venous insufficiency (chronic) (peripheral): Secondary | ICD-10-CM | POA: Diagnosis not present

## 2019-08-14 DIAGNOSIS — I110 Hypertensive heart disease with heart failure: Secondary | ICD-10-CM | POA: Diagnosis not present

## 2019-08-14 DIAGNOSIS — Z7689 Persons encountering health services in other specified circumstances: Secondary | ICD-10-CM | POA: Diagnosis not present

## 2019-08-14 DIAGNOSIS — I252 Old myocardial infarction: Secondary | ICD-10-CM | POA: Diagnosis not present

## 2019-08-15 ENCOUNTER — Ambulatory Visit (INDEPENDENT_AMBULATORY_CARE_PROVIDER_SITE_OTHER): Payer: Medicaid Other | Admitting: Primary Care

## 2019-08-15 ENCOUNTER — Encounter (INDEPENDENT_AMBULATORY_CARE_PROVIDER_SITE_OTHER): Payer: Self-pay | Admitting: Primary Care

## 2019-08-15 ENCOUNTER — Other Ambulatory Visit: Payer: Self-pay

## 2019-08-15 VITALS — BP 138/88 | HR 82 | Temp 97.8°F | Ht 67.0 in | Wt >= 6400 oz

## 2019-08-15 DIAGNOSIS — I10 Essential (primary) hypertension: Secondary | ICD-10-CM

## 2019-08-15 DIAGNOSIS — Z794 Long term (current) use of insulin: Secondary | ICD-10-CM

## 2019-08-15 DIAGNOSIS — E1140 Type 2 diabetes mellitus with diabetic neuropathy, unspecified: Secondary | ICD-10-CM

## 2019-08-15 DIAGNOSIS — G4733 Obstructive sleep apnea (adult) (pediatric): Secondary | ICD-10-CM

## 2019-08-15 DIAGNOSIS — Z6841 Body Mass Index (BMI) 40.0 and over, adult: Secondary | ICD-10-CM

## 2019-08-15 DIAGNOSIS — F5101 Primary insomnia: Secondary | ICD-10-CM

## 2019-08-15 NOTE — Progress Notes (Signed)
Established Patient Office Visit  Subjective:  Patient ID: Lindsay [name], female    DOB: 03-01-1981  Age: 38 y.o. MRN: [id number]  CC: Patient  Chief Complaint  Patient presents with  . Sleep Apnea    face to face for CPAP    HPI Lindsay [name] presents for a face to face visit for a CPAP for diagnosis of obstructive sleep apnea.  Past Medical History:  Diagnosis Date  . ARF (acute renal failure) ([place]) 04/2015  . Asthma   . Cellulitis of right upper extremity   . Coronary artery disease   . Diabetes mellitus    insulin dependent  . Hyperlipidemia LDL goal <70   . Hypertension   . NSTEMI (non-ST elevated myocardial infarction) ([place]) 08/2016  . Obesity   . S/P angioplasty with stent 08/2016   DES to mLAD and PTCA only to 2nd diag ostium.     Past Surgical History:  Procedure Laterality Date  . CARDIAC CATHETERIZATION N/A 09/07/2016   Procedure: Left Heart Cath and Coronary Angiography;  Surgeon: [name], MD;  Location: [facility];  Service: Cardiovascular;  Laterality: N/A;  . CARDIAC CATHETERIZATION N/A 09/07/2016   Procedure: Coronary Stent Intervention;  Surgeon: [name], MD;  Location: [facility];  Service: Cardiovascular;  Laterality: N/A;  . CARDIAC CATHETERIZATION N/A 09/07/2016   Procedure: Coronary Balloon Angioplasty;  Surgeon: [name], MD;  Location: [facility];  Service: Cardiovascular;  Laterality: N/A;  . CESAREAN SECTION    . IRRIGATION AND DEBRIDEMENT SHOULDER Right 04/30/2015   Procedure: IRRIGATION AND DEBRIDEMENT SHOULDER;  Surgeon: Renette Butters, MD;  Location: [facility];  Service: Orthopedics;  Laterality: Right;  . IRRIGATION AND DEBRIDEMENT SHOULDER Right 05/01/2015  . LEG SURGERY    . SHOULDER ARTHROSCOPY Right 04/30/2015   Procedure: ARTHROSCOPY SHOULDER;  Surgeon: Renette Butters, MD;  Location: [facility];  Service: Orthopedics;  Laterality: Right;  . TONSILLECTOMY      Family History  Problem Relation  Age of Onset  . Diabetes Mother   . Hypertension Mother   . Heart disease Mother   . Diabetes Father   . Heart disease Father   . Stroke Maternal Grandmother   . Cancer Maternal Grandmother     Social History   Socioeconomic History  . Marital status: Single    Spouse name: Not on file  . Number of children: Not on file  . Years of education: Not on file  . Highest education level: Not on file  Occupational History  . Not on file  Social Needs  . Financial resource strain: Not on file  . Food insecurity    Worry: Not on file    Inability: Not on file  . Transportation needs    Medical: Not on file    Non-medical: Not on file  Tobacco Use  . Smoking status: Never Smoker  . Smokeless tobacco: Never Used  Substance and Sexual Activity  . Alcohol use: Yes    Comment: socially  . Drug use: No  . Sexual activity: Not on file  Lifestyle  . Physical activity    Days per week: Not on file    Minutes per session: Not on file  . Stress: Not on file  Relationships  . Social Herbalist on phone: Not on file    Gets together: Not on file    Attends religious service: Not on file  Active member of club or organization: Not on file    Attends meetings of clubs or organizations: Not on file    Relationship status: Not on file  . Intimate partner violence    Fear of current or ex partner: Not on file    Emotionally abused: Not on file    Physically abused: Not on file    Forced sexual activity: Not on file  Other Topics Concern  . Not on file  Social History Narrative  . Not on file    Outpatient Medications Prior to Visit  Medication Sig Dispense Refill  . albuterol (PROVENTIL HFA) 108 (90 Base) MCG/ACT inhaler INHALE 2 PUFFS BY MOUTH EVERY 6 HOURS AS NEEDED FOR WHEEZING OR SHORTNESS OF BREATH 1 each 2  . amLODipine (NORVASC) 10 MG tablet Take 1 tablet (10 mg total) by mouth daily. 90 tablet 0  . aspirin 81 MG tablet Take 1 tablet (81 mg total) by mouth  daily. 30 tablet 11  . aspirin-acetaminophen-caffeine (EXCEDRIN MIGRAINE) [phone] MG tablet Take 2 tablets by mouth every 6 (six) hours as needed for headache.    Marland Kitchen atorvastatin (LIPITOR) 80 MG tablet Take 1 tablet (80 mg total) by mouth daily at 6 PM. Please call and schedule November/December follow up. [phone] 30 tablet 11  . b complex vitamins tablet Take 1 tablet by mouth daily.    . baclofen (LIORESAL) 10 MG tablet Take 0.5-1 tablets (5-10 mg total) by mouth 3 (three) times daily as needed for muscle spasms. 30 each 1  . BD VEO INSULIN SYRINGE U/F 31G X 15/64" 1 ML MISC USE  SYRINGE ONCE DAILY 100 each 1  . Blood Glucose Monitoring Suppl (ACCU-CHEK AVIVA PLUS) w/Device KIT 1 each by Does not apply route as directed. 1 kit 0  . DULoxetine (CYMBALTA) 30 MG capsule TAKE 1 CAPSULE BY MOUTH  DAILY 90 capsule 1  . empagliflozin (JARDIANCE) 10 MG TABS tablet Take 10 mg by mouth daily. 30 tablet 3  . ezetimibe (ZETIA) 10 MG tablet Take 1 tablet (10 mg total) by mouth daily. 90 tablet 3  . gabapentin (NEURONTIN) 300 MG capsule TAKE 2 CAPSULES BY MOUTH THREE TIMES DAILY 180 capsule 0  . glucose blood (ACCU-CHEK AVIVA PLUS) test strip Use as instructed to test blood sugar 4 times daily. 100 each 12  . hydrochlorothiazide (HYDRODIURIL) 25 MG tablet Take 1 tablet (25 mg total) by mouth every morning. 30 tablet 3  . hydrocortisone 2.5 % ointment Apply topically for rash on face up to twice daily as needed.    Marland Kitchen ibuprofen (ADVIL,MOTRIN) 200 MG tablet Take 400 mg by mouth every 6 (six) hours as needed for fever or moderate pain.    . Insulin Pen Needle (B-D UF III MINI PEN NEEDLES) 31G X 5 MM MISC Use as instructed. Monitor blood glucose levels three times per day 90 each 1  . insulin regular human CONCENTRATED (HUMULIN R) 500 UNIT/ML injection INJECT 70 UNITS 30 MINUTES BEFORE EACH MEAL 20 mL 3  . ketoconazole (NIZORAL) 2 % shampoo Use as a shampoo at least once weekly.    Marland Kitchen liraglutide (VICTOZA) 18  MG/3ML SOPN Inject 0.75m daily x1 week then 1.269mdaily x1 week then 1.54m72maily and continue    . losartan (COZAAR) 100 MG tablet Take 1 tablet (100 mg total) by mouth daily. 90 tablet 3  . Melatonin 10 MG TABS Take 3 tablets by mouth at bedtime. 30 tablet 2  . methocarbamol (ROBAXIN) 750 MG  tablet Take 1 tablet (750 mg total) by mouth at bedtime. 30 tablet 3  . metoprolol tartrate (LOPRESSOR) 50 MG tablet Take 1 tablet (50 mg total) by mouth 2 (two) times daily. 60 tablet 11  . Multiple Vitamins-Minerals (MULTIVITAMIN PO) Take 1 tablet by mouth daily.    . mupirocin ointment (BACTROBAN) 2 % Apply twice daily to the affected area x 7 days or until healed 22 g 1  . nitroGLYCERIN (NITROSTAT) 0.4 MG SL tablet Place 1 tablet (0.4 mg total) under the tongue every 5 (five) minutes as needed for chest pain. 25 tablet 11  . Insulin Glargine (LANTUS SOLOSTAR) 100 UNIT/ML Solostar Pen Inject 50 Units into the skin at bedtime. 45 mL 2   No facility-administered medications prior to visit.     Allergies  Allergen Reactions  . Hydrazine Yellow [Tartrazine] Shortness Of Breath and Swelling    Swelling mostly noticed in legs and feet, retaining urination, shortness of breaht, and minor chest pain  . Lisinopril Shortness Of Breath    Was on prinzide; had sob/chest pain on it.  . Tylenol [Acetaminophen] Itching and Swelling    Itching of the mouth, swelling of tongue and stomach started hurting    ROS Review of Systems  Constitutional: Positive for activity change and fatigue.  Respiratory: Positive for shortness of breath.   Cardiovascular: Positive for leg swelling.  Musculoskeletal: Positive for arthralgias.  Skin: Positive for wound.  Psychiatric/Behavioral: Positive for sleep disturbance.      Objective:    Physical Exam  Constitutional: She is oriented to person, place, and time.  Morbid obesity  HENT:  Head: Normocephalic.  Neck: Neck supple.  Cardiovascular: Normal rate and regular  rhythm.  Abdominal: Soft. Bowel sounds are normal. She exhibits distension.  Musculoskeletal:        General: Edema present.  Neurological: She is alert and oriented to person, place, and time.  Skin: Skin is warm and dry.  Psychiatric: She has a normal mood and affect.    BP 138/88 (BP Location: Left Wrist, Patient Position: Sitting, Cuff Size: Normal)   Pulse 82   Temp 97.8 F (36.6 C) (Tympanic)   Ht 5' 7"  (1.702 m)   Wt (!) 429 lb 9.6 oz (194.9 kg)   SpO2 94%   BMI 67.28 kg/m  Wt Readings from Last 3 Encounters:  08/15/19 (!) 429 lb 9.6 oz (194.9 kg)  07/13/19 (!) 403 lb (182.8 kg)  05/27/19 (!) 403 lb (182.8 kg)     Health Maintenance Due  Topic Date Due  . FOOT EXAM  09/28/2017  . OPHTHALMOLOGY EXAM  11/20/2017  . INFLUENZA VACCINE  07/22/2019  . PAP SMEAR-Modifier  08/15/2019    There are no preventive care reminders to display for this patient.  Lab Results  Component Value Date   TSH 2.930 05/28/2017   Lab Results  Component Value Date   WBC 7.6 04/12/2019   HGB 13.2 04/12/2019   HCT 41.0 04/12/2019   MCV 83 04/12/2019   PLT 284 04/12/2019   Lab Results  Component Value Date   NA 139 01/31/2019   K 4.8 01/31/2019   CO2 22 01/31/2019   GLUCOSE 198 (H) 01/31/2019   BUN 15 01/31/2019   CREATININE 0.81 01/31/2019   BILITOT 0.5 01/11/2019   ALKPHOS 93 01/11/2019   AST 27 01/11/2019   ALT 32 01/11/2019   PROT 8.0 01/11/2019   ALBUMIN 3.6 01/11/2019   CALCIUM 9.6 01/31/2019   ANIONGAP 10 01/11/2019  GFR 105.56 04/12/2018   Lab Results  Component Value Date   CHOL 149 04/12/2019   Lab Results  Component Value Date   HDL 42 04/12/2019   Lab Results  Component Value Date   LDLCALC 43 04/12/2019   Lab Results  Component Value Date   TRIG 320 (H) 04/12/2019   Lab Results  Component Value Date   CHOLHDL 3.5 04/12/2019   Lab Results  Component Value Date   HGBA1C 8.4 (H) 04/12/2019      Assessment & Plan:   Lindsay [name] was seen  today for sleep apnea.  Diagnoses and all orders for this visit:  OSA (obstructive sleep apnea) Patient presents with possible obstructive sleep apnea. Patent has a  history of symptoms of OSA.  Patient generally gets 2-4 hours of sleep per night, and states they generally have poor quality. Snoring of moderate severity is present. Apneic episodesis present.  Patient has not had tonsillectomy.   Morbid obesity ([place]) Morbid Obesity is greater than 40 BMI  indicating an excess in caloric intake or underlining conditions. This may lead to other co-morbidities. Lifestyle modifications of diet and exercise may reduce obesity.   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 Can try melatonin 41m-15 mg at night for sleep, can also do benadryl 25-552mat night for sleep.  If this does not help we can try prescription medication.  Also here is some information about good sleep hygiene.   Insomnia Insomnia is frequent trouble falling and/or staying asleep. Insomnia can be a long term problem or a short term problem. Both are common. Insomnia can be a short term problem when the wakefulness is related to a certain stress or worry. Long term insomnia is often related to ongoing stress during waking hours and/or poor sleeping habits. Overtime, sleep deprivation itself can make the problem worse. Every little thing feels more severe because you are overtired and your ability to cope is decreased. CAUSES   Stress, anxiety, and depression.  Poor sleeping habits.  Distractions such as TV in the bedroom.  Naps close to bedtime.  Engaging in emotionally charged conversations before bed.  Technical reading before sleep.  Alcohol and other sedatives. They may make the problem worse. They can hurt normal sleep patterns and normal dream activity.  Stimulants such as caffeine for several hours prior to bedtime.  Pain syndromes and shortness of breath can cause insomnia.  Exercise late at  night.  Changing time zones may cause sleeping problems (jet lag). It is sometimes helpful to have someone observe your sleeping patterns. They should look for periods of not breathing during the night (sleep apnea). They should also look to see how long those periods last. If you live alone or observers are uncertain, you can also be observed at a sleep clinic where your sleep patterns will be professionally monitored. Sleep apnea requires a checkup and treatment. Give your caregivers your medical history. Give your caregivers observations your family has made about your sleep.  SYMPTOMS   Not feeling rested in the morning.  Anxiety and restlessness at bedtime.  Difficulty falling and staying asleep. TREATMENT   Your caregiver may prescribe treatment for an underlying medical disorders. Your caregiver can give advice or help if you are using alcohol or other drugs for self-medication. Treatment of underlying problems will usually eliminate insomnia problems.  Medications can be prescribed for short time use. They are generally not recommended for lengthy use.  Over-the-counter sleep medicines are not recommended for lengthy  use. They can be habit forming.  You can promote easier sleeping by making lifestyle changes such as:  Using relaxation techniques that help with breathing and reduce muscle tension.  Exercising earlier in the day.  Changing your diet and the time of your last meal. No night time snacks.  Establish a regular time to go to bed.  Counseling can help with stressful problems and worry.  Soothing music and white noise may be helpful if there are background noises you cannot remove.  Stop tedious detailed work at least one hour before bedtime. HOME CARE INSTRUCTIONS   Keep a diary. Inform your caregiver about your progress. This includes any medication side effects. See your caregiver regularly. Take note of:  Times when you are asleep.  Times when you are awake  during the night.  The quality of your sleep.  How you feel the next day. This information will help your caregiver care for you.  Get out of bed if you are still awake after 15 minutes. Read or do some quiet activity. Keep the lights down. Wait until you feel sleepy and go back to bed.  Keep regular sleeping and waking hours. Avoid naps.  Exercise regularly.  Avoid distractions at bedtime. Distractions include watching television or engaging in any intense or detailed activity like attempting to balance the household checkbook.  Develop a bedtime ritual. Keep a familiar routine of bathing, brushing your teeth, climbing into bed at the same time each night, listening to soothing music. Routines increase the success of falling to sleep faster.  Use relaxation techniques. This can be using breathing and muscle tension release routines. It can also include visualizing peaceful scenes. You can also help control troubling or intruding thoughts by keeping your mind occupied with boring or repetitive thoughts like the old concept of counting sheep. You can make it more creative like imagining planting one beautiful flower after another in your backyard garden.  During your day, work to eliminate stress. When this is not possible use some of the previous suggestions to help reduce the anxiety that accompanies stressful situations. MAKE SURE YOU:   Understand these instructions.  Will watch your condition.  Will get help right away if you are not doing well or get worse.  . This information is not intended to replace advice given to you by your health care provider. Make sure you discuss any questions you have with your health care provider.  Type 2 diabetes mellitus with diabetic neuropathy, with long-term current use of insulin (HCC) ADA recommends the following therapeutic goals for glycemic control related to A1c measurements: Goal of therapy: Less than 6.5 hemoglobin A1c.  Reference  clinical practice recommendations. Foods that are high in carbohydrates are the following rice, potatoes, breads, sugars, and pastas.  Reduction in the intake (eating) will assist in lowering your blood sugars.   No orders of the defined types were placed in this encounter.   Follow-up: Return in about 3 months (around 11/15/2019) for HTN, T2D.    [name], NP

## 2019-08-15 NOTE — Patient Instructions (Signed)
Living With Sleep Apnea Sleep apnea is a condition in which breathing pauses or becomes shallow during sleep. Sleep apnea is most commonly caused by a collapsed or blocked airway. People with sleep apnea snore loudly and have times when they gasp and stop breathing for 10 seconds or more during sleep. This happens over and over during the night. This disrupts your sleep and keeps your body from getting the rest that it needs, which can cause tiredness and lack of energy (fatigue) during the day. The breaks in breathing also interrupt the deep sleep that you need to feel rested. Even if you do not completely wake up from the gaps in breathing, your sleep may not be restful. You may also have a headache in the morning and low energy during the day, and you may feel anxious or depressed. How can sleep apnea affect me? Sleep apnea increases your chances of extreme tiredness during the day (daytime fatigue). It can also increase your risk for health conditions, such as:  Heart attack.  Stroke.  Diabetes.  Heart failure.  Irregular heartbeat.  High blood pressure. If you have daytime fatigue as a result of sleep apnea, you may be more likely to:  Perform poorly at school or work.  Fall asleep while driving.  Have difficulty with attention.  Develop depression or anxiety.  Become severely overweight (obese).  Have sexual dysfunction. What actions can I take to manage sleep apnea? Sleep apnea treatment   If you were given a device to open your airway while you sleep, use it only as told by your health care provider. You may be given: ? An oral appliance. This is a custom-made mouthpiece that shifts your lower jaw forward. ? A continuous positive airway pressure (CPAP) device. This device blows air through a mask when you breathe out (exhale). ? A nasal expiratory positive airway pressure (EPAP) device. This device has valves that you put into each nostril. ? A bi-level positive airway  pressure (BPAP) device. This device blows air through a mask when you breathe in (inhale) and breathe out (exhale).  You may need surgery if other treatments do not work for you. Sleep habits  Go to sleep and wake up at the same time every day. This helps set your internal clock (circadian rhythm) for sleeping. ? If you stay up later than usual, such as on weekends, try to get up in the morning within 2 hours of your normal wake time.  Try to get at least 7-9 hours of sleep each night.  Stop computer, tablet, and mobile phone use a few hours before bedtime.  Do not take long naps during the day. If you nap, limit it to 30 minutes.  Have a relaxing bedtime routine. Reading or listening to music may relax you and help you sleep.  Use your bedroom only for sleep. ? Keep your television and computer out of your bedroom. ? Keep your bedroom cool, dark, and quiet. ? Use a supportive mattress and pillows.  Follow your health care provider's instructions for other changes to sleep habits. Nutrition  Do not eat heavy meals in the evening.  Do not have caffeine in the later part of the day. The effects of caffeine can last for more than 5 hours.  Follow your health care provider's or dietitian's instructions for any diet changes. Lifestyle      Do not drink alcohol before bedtime. Alcohol can cause you to fall asleep at first, but then it can cause   you to wake up in the middle of the night and have trouble getting back to sleep.  Do not use any products that contain nicotine or tobacco, such as cigarettes and e-cigarettes. If you need help quitting, ask your health care provider. Medicines  Take over-the-counter and prescription medicines only as told by your health care provider.  Do not use over-the-counter sleep medicine. You can become dependent on this medicine, and it can make sleep apnea worse.  Do not use medicines, such as sedatives and narcotics, unless told by your health  care provider. Activity  Exercise on most days, but avoid exercising in the evening. Exercising near bedtime can interfere with sleeping.  If possible, spend time outside every day. Natural light helps regulate your circadian rhythm. General information  Lose weight if you need to, and maintain a healthy weight.  Keep all follow-up visits as told by your health care provider. This is important.  If you are having surgery, make sure to tell your health care provider that you have sleep apnea. You may need to bring your device with you. Where to find more information Learn more about sleep apnea and daytime fatigue from:  American Sleep Association: sleepassociation.org  National Sleep Foundation: sleepfoundation.org  National Heart, Lung, and Blood Institute: nhlbi.nih.gov Summary  Sleep apnea can cause daytime fatigue and other serious health conditions.  Both sleep apnea and daytime fatigue can be bad for your health and well-being.  You may need to wear a device while sleeping to help keep your airway open.  If you are having surgery, make sure to tell your health care provider that you have sleep apnea. You may need to bring your device with you.  Making changes to sleep habits, diet, lifestyle, and activity can help you manage sleep apnea. This information is not intended to replace advice given to you by your health care provider. Make sure you discuss any questions you have with your health care provider. Document Released: 03/03/2018 Document Revised: 03/31/2019 Document Reviewed: 03/03/2018 Elsevier Patient Education  2020 [employer].  

## 2019-08-21 ENCOUNTER — Other Ambulatory Visit (INDEPENDENT_AMBULATORY_CARE_PROVIDER_SITE_OTHER): Payer: Self-pay | Admitting: Primary Care

## 2019-08-21 DIAGNOSIS — I110 Hypertensive heart disease with heart failure: Secondary | ICD-10-CM | POA: Diagnosis not present

## 2019-08-21 DIAGNOSIS — I890 Lymphedema, not elsewhere classified: Secondary | ICD-10-CM | POA: Diagnosis not present

## 2019-08-21 DIAGNOSIS — Z7689 Persons encountering health services in other specified circumstances: Secondary | ICD-10-CM | POA: Diagnosis not present

## 2019-08-21 DIAGNOSIS — L97212 Non-pressure chronic ulcer of right calf with fat layer exposed: Secondary | ICD-10-CM | POA: Diagnosis not present

## 2019-08-21 DIAGNOSIS — E1142 Type 2 diabetes mellitus with diabetic polyneuropathy: Secondary | ICD-10-CM | POA: Diagnosis not present

## 2019-08-21 DIAGNOSIS — I872 Venous insufficiency (chronic) (peripheral): Secondary | ICD-10-CM | POA: Diagnosis not present

## 2019-08-21 DIAGNOSIS — E1140 Type 2 diabetes mellitus with diabetic neuropathy, unspecified: Secondary | ICD-10-CM

## 2019-08-21 DIAGNOSIS — I2510 Atherosclerotic heart disease of native coronary artery without angina pectoris: Secondary | ICD-10-CM | POA: Diagnosis not present

## 2019-08-21 DIAGNOSIS — E11622 Type 2 diabetes mellitus with other skin ulcer: Secondary | ICD-10-CM | POA: Diagnosis not present

## 2019-08-21 DIAGNOSIS — I509 Heart failure, unspecified: Secondary | ICD-10-CM | POA: Diagnosis not present

## 2019-08-21 DIAGNOSIS — I252 Old myocardial infarction: Secondary | ICD-10-CM | POA: Diagnosis not present

## 2019-08-21 DIAGNOSIS — L97812 Non-pressure chronic ulcer of other part of right lower leg with fat layer exposed: Secondary | ICD-10-CM | POA: Diagnosis not present

## 2019-08-21 NOTE — Telephone Encounter (Signed)
FWD to PCP. Ioma Chismar S Halla Chopp, CMA  

## 2019-08-22 ENCOUNTER — Other Ambulatory Visit (INDEPENDENT_AMBULATORY_CARE_PROVIDER_SITE_OTHER): Payer: Self-pay | Admitting: Primary Care

## 2019-08-22 DIAGNOSIS — Z794 Long term (current) use of insulin: Secondary | ICD-10-CM

## 2019-08-22 DIAGNOSIS — E1140 Type 2 diabetes mellitus with diabetic neuropathy, unspecified: Secondary | ICD-10-CM

## 2019-08-22 MED ORDER — GABAPENTIN 300 MG PO CAPS: 600.0000 mg | ORAL_CAPSULE | Freq: Three times a day (TID) | ORAL | 3 refills | Status: DC

## 2019-08-24 ENCOUNTER — Ambulatory Visit (INDEPENDENT_AMBULATORY_CARE_PROVIDER_SITE_OTHER): Payer: Medicaid Other | Admitting: Primary Care

## 2019-08-26 ENCOUNTER — Other Ambulatory Visit: Payer: Medicaid Other

## 2019-08-29 ENCOUNTER — Encounter (HOSPITAL_BASED_OUTPATIENT_CLINIC_OR_DEPARTMENT_OTHER): Payer: Medicaid Other | Attending: Internal Medicine

## 2019-08-29 ENCOUNTER — Other Ambulatory Visit: Payer: Self-pay

## 2019-08-29 ENCOUNTER — Other Ambulatory Visit: Payer: Self-pay | Admitting: Family Medicine

## 2019-08-29 DIAGNOSIS — L97812 Non-pressure chronic ulcer of other part of right lower leg with fat layer exposed: Secondary | ICD-10-CM | POA: Diagnosis not present

## 2019-08-29 DIAGNOSIS — I252 Old myocardial infarction: Secondary | ICD-10-CM | POA: Insufficient documentation

## 2019-08-29 DIAGNOSIS — E1140 Type 2 diabetes mellitus with diabetic neuropathy, unspecified: Secondary | ICD-10-CM | POA: Insufficient documentation

## 2019-08-29 DIAGNOSIS — E11622 Type 2 diabetes mellitus with other skin ulcer: Secondary | ICD-10-CM | POA: Insufficient documentation

## 2019-08-29 DIAGNOSIS — I2510 Atherosclerotic heart disease of native coronary artery without angina pectoris: Secondary | ICD-10-CM | POA: Diagnosis not present

## 2019-08-29 DIAGNOSIS — I110 Hypertensive heart disease with heart failure: Secondary | ICD-10-CM | POA: Diagnosis not present

## 2019-08-29 DIAGNOSIS — I509 Heart failure, unspecified: Secondary | ICD-10-CM | POA: Diagnosis not present

## 2019-08-30 DIAGNOSIS — H538 Other visual disturbances: Secondary | ICD-10-CM | POA: Diagnosis not present

## 2019-08-30 DIAGNOSIS — E119 Type 2 diabetes mellitus without complications: Secondary | ICD-10-CM | POA: Diagnosis not present

## 2019-08-30 DIAGNOSIS — H53023 Refractive amblyopia, bilateral: Secondary | ICD-10-CM | POA: Diagnosis not present

## 2019-08-30 DIAGNOSIS — Z7689 Persons encountering health services in other specified circumstances: Secondary | ICD-10-CM | POA: Diagnosis not present

## 2019-09-04 ENCOUNTER — Other Ambulatory Visit: Payer: Self-pay

## 2019-09-04 DIAGNOSIS — I110 Hypertensive heart disease with heart failure: Secondary | ICD-10-CM | POA: Diagnosis not present

## 2019-09-04 DIAGNOSIS — I509 Heart failure, unspecified: Secondary | ICD-10-CM | POA: Diagnosis not present

## 2019-09-04 DIAGNOSIS — I252 Old myocardial infarction: Secondary | ICD-10-CM | POA: Diagnosis not present

## 2019-09-04 DIAGNOSIS — E1140 Type 2 diabetes mellitus with diabetic neuropathy, unspecified: Secondary | ICD-10-CM | POA: Diagnosis not present

## 2019-09-04 DIAGNOSIS — I2510 Atherosclerotic heart disease of native coronary artery without angina pectoris: Secondary | ICD-10-CM | POA: Diagnosis not present

## 2019-09-04 DIAGNOSIS — I872 Venous insufficiency (chronic) (peripheral): Secondary | ICD-10-CM | POA: Diagnosis not present

## 2019-09-04 DIAGNOSIS — E11622 Type 2 diabetes mellitus with other skin ulcer: Secondary | ICD-10-CM | POA: Diagnosis not present

## 2019-09-04 DIAGNOSIS — Z7689 Persons encountering health services in other specified circumstances: Secondary | ICD-10-CM | POA: Diagnosis not present

## 2019-09-04 DIAGNOSIS — L97812 Non-pressure chronic ulcer of other part of right lower leg with fat layer exposed: Secondary | ICD-10-CM | POA: Diagnosis not present

## 2019-09-04 DIAGNOSIS — L97212 Non-pressure chronic ulcer of right calf with fat layer exposed: Secondary | ICD-10-CM | POA: Diagnosis not present

## 2019-09-06 ENCOUNTER — Other Ambulatory Visit: Payer: Self-pay | Admitting: Family Medicine

## 2019-09-11 ENCOUNTER — Ambulatory Visit: Payer: Medicaid Other | Admitting: Podiatry

## 2019-09-11 DIAGNOSIS — L97212 Non-pressure chronic ulcer of right calf with fat layer exposed: Secondary | ICD-10-CM | POA: Diagnosis not present

## 2019-09-11 DIAGNOSIS — E11622 Type 2 diabetes mellitus with other skin ulcer: Secondary | ICD-10-CM | POA: Diagnosis not present

## 2019-09-11 DIAGNOSIS — E1140 Type 2 diabetes mellitus with diabetic neuropathy, unspecified: Secondary | ICD-10-CM | POA: Diagnosis not present

## 2019-09-11 DIAGNOSIS — I2510 Atherosclerotic heart disease of native coronary artery without angina pectoris: Secondary | ICD-10-CM | POA: Diagnosis not present

## 2019-09-11 DIAGNOSIS — I110 Hypertensive heart disease with heart failure: Secondary | ICD-10-CM | POA: Diagnosis not present

## 2019-09-11 DIAGNOSIS — Z7689 Persons encountering health services in other specified circumstances: Secondary | ICD-10-CM | POA: Diagnosis not present

## 2019-09-11 DIAGNOSIS — L97812 Non-pressure chronic ulcer of other part of right lower leg with fat layer exposed: Secondary | ICD-10-CM | POA: Diagnosis not present

## 2019-09-11 DIAGNOSIS — I252 Old myocardial infarction: Secondary | ICD-10-CM | POA: Diagnosis not present

## 2019-09-11 DIAGNOSIS — I872 Venous insufficiency (chronic) (peripheral): Secondary | ICD-10-CM | POA: Diagnosis not present

## 2019-09-11 DIAGNOSIS — I509 Heart failure, unspecified: Secondary | ICD-10-CM | POA: Diagnosis not present

## 2019-09-12 ENCOUNTER — Telehealth: Payer: Self-pay

## 2019-09-12 ENCOUNTER — Other Ambulatory Visit: Payer: Self-pay | Admitting: Vascular Surgery

## 2019-09-12 DIAGNOSIS — L0390 Cellulitis, unspecified: Secondary | ICD-10-CM

## 2019-09-12 NOTE — Telephone Encounter (Signed)
I called and left a message on the patient's voice mail: MRI has been denied until she has had at least 6 weeks of PT. She should be getting a call from [facility]  in [place] to set up an appointment.

## 2019-09-18 DIAGNOSIS — N179 Acute kidney failure, unspecified: Secondary | ICD-10-CM | POA: Diagnosis not present

## 2019-09-18 DIAGNOSIS — G4733 Obstructive sleep apnea (adult) (pediatric): Secondary | ICD-10-CM | POA: Diagnosis not present

## 2019-09-18 DIAGNOSIS — Z7689 Persons encountering health services in other specified circumstances: Secondary | ICD-10-CM | POA: Diagnosis not present

## 2019-09-18 DIAGNOSIS — Z6841 Body Mass Index (BMI) 40.0 and over, adult: Secondary | ICD-10-CM | POA: Diagnosis not present

## 2019-09-18 DIAGNOSIS — E11622 Type 2 diabetes mellitus with other skin ulcer: Secondary | ICD-10-CM | POA: Diagnosis not present

## 2019-09-18 DIAGNOSIS — E785 Hyperlipidemia, unspecified: Secondary | ICD-10-CM | POA: Diagnosis not present

## 2019-09-18 DIAGNOSIS — I890 Lymphedema, not elsewhere classified: Secondary | ICD-10-CM | POA: Diagnosis not present

## 2019-09-18 DIAGNOSIS — Z794 Long term (current) use of insulin: Secondary | ICD-10-CM | POA: Diagnosis not present

## 2019-09-18 DIAGNOSIS — I10 Essential (primary) hypertension: Secondary | ICD-10-CM | POA: Diagnosis not present

## 2019-09-18 DIAGNOSIS — L97812 Non-pressure chronic ulcer of other part of right lower leg with fat layer exposed: Secondary | ICD-10-CM | POA: Diagnosis not present

## 2019-09-18 DIAGNOSIS — E1165 Type 2 diabetes mellitus with hyperglycemia: Secondary | ICD-10-CM | POA: Diagnosis not present

## 2019-09-18 DIAGNOSIS — I252 Old myocardial infarction: Secondary | ICD-10-CM | POA: Diagnosis not present

## 2019-09-18 DIAGNOSIS — I872 Venous insufficiency (chronic) (peripheral): Secondary | ICD-10-CM | POA: Diagnosis not present

## 2019-09-18 DIAGNOSIS — I2510 Atherosclerotic heart disease of native coronary artery without angina pectoris: Secondary | ICD-10-CM | POA: Diagnosis not present

## 2019-09-18 DIAGNOSIS — E1140 Type 2 diabetes mellitus with diabetic neuropathy, unspecified: Secondary | ICD-10-CM | POA: Diagnosis not present

## 2019-09-18 DIAGNOSIS — D649 Anemia, unspecified: Secondary | ICD-10-CM | POA: Diagnosis not present

## 2019-09-18 DIAGNOSIS — I509 Heart failure, unspecified: Secondary | ICD-10-CM | POA: Diagnosis not present

## 2019-09-18 DIAGNOSIS — I110 Hypertensive heart disease with heart failure: Secondary | ICD-10-CM | POA: Diagnosis not present

## 2019-09-18 DIAGNOSIS — L97212 Non-pressure chronic ulcer of right calf with fat layer exposed: Secondary | ICD-10-CM | POA: Diagnosis not present

## 2019-09-25 ENCOUNTER — Other Ambulatory Visit: Payer: Self-pay

## 2019-09-25 ENCOUNTER — Encounter (HOSPITAL_BASED_OUTPATIENT_CLINIC_OR_DEPARTMENT_OTHER): Payer: Medicaid Other | Attending: Internal Medicine | Admitting: Internal Medicine

## 2019-09-25 DIAGNOSIS — L97812 Non-pressure chronic ulcer of other part of right lower leg with fat layer exposed: Secondary | ICD-10-CM | POA: Insufficient documentation

## 2019-09-25 DIAGNOSIS — I110 Hypertensive heart disease with heart failure: Secondary | ICD-10-CM | POA: Insufficient documentation

## 2019-09-25 DIAGNOSIS — I252 Old myocardial infarction: Secondary | ICD-10-CM | POA: Diagnosis not present

## 2019-09-25 DIAGNOSIS — E11622 Type 2 diabetes mellitus with other skin ulcer: Secondary | ICD-10-CM | POA: Insufficient documentation

## 2019-09-25 DIAGNOSIS — I890 Lymphedema, not elsewhere classified: Secondary | ICD-10-CM | POA: Diagnosis not present

## 2019-09-25 DIAGNOSIS — I509 Heart failure, unspecified: Secondary | ICD-10-CM | POA: Insufficient documentation

## 2019-09-25 DIAGNOSIS — I2510 Atherosclerotic heart disease of native coronary artery without angina pectoris: Secondary | ICD-10-CM | POA: Insufficient documentation

## 2019-09-25 DIAGNOSIS — I872 Venous insufficiency (chronic) (peripheral): Secondary | ICD-10-CM | POA: Diagnosis not present

## 2019-09-25 DIAGNOSIS — E1140 Type 2 diabetes mellitus with diabetic neuropathy, unspecified: Secondary | ICD-10-CM | POA: Insufficient documentation

## 2019-09-25 DIAGNOSIS — L97212 Non-pressure chronic ulcer of right calf with fat layer exposed: Secondary | ICD-10-CM | POA: Diagnosis not present

## 2019-09-25 DIAGNOSIS — Z7689 Persons encountering health services in other specified circumstances: Secondary | ICD-10-CM | POA: Diagnosis not present

## 2019-09-25 NOTE — Progress Notes (Addendum)
Lindsay [name], Lindsay [name] ([id number]) Visit Report for 09/25/2019 Arrival Information Details Patient Name: Date of Service: Lindsay [name], Lindsay [name] 09/25/2019 12:45 PM Medical Record [id number] Patient Account Number: [id number] Date of Birth/Sex: Treating RN: 1981/05/21 (38 y.o. Helene Shoe, Meta.Reding Primary Care Yeng Frankie: Juluis Mire Other Clinician: Referring Mariajose Mow: Treating Dakoda Bassette/Extender:Robson, Melanie Crazier, Zenaida Niece in Treatment: 10 Visit Information History Since Last Visit Cane Added or deleted any medications: No Patient Arrived: 12:56 Any new allergies or adverse reactions: No Arrival Time: Had a fall or experienced change in No Accompanied By: self None activities of daily living that may affect Transfer Assistance: risk of falls: Patient Identification Verified: Yes Signs or symptoms of abuse/neglect since last No Secondary Verification Process Completed: Yes visito Patient Requires Transmission-Based No Hospitalized since last visit: No Precautions: Implantable device outside of the clinic excluding No Patient Has Alerts: No cellular tissue based products placed in the center since last visit: Has Dressing in Place as Prescribed: Yes Has Compression in Place as Prescribed: Yes Pain Present Now: Yes Electronic Signature(s) Signed: 09/25/2019 5:53:08 PM By: Deon Pilling Entered By: Deon Pilling on 09/25/2019 12:56:52 -------------------------------------------------------------------------------- Compression Therapy Details Patient Name: Date of Service: Lindsay [name] 09/25/2019 12:45 PM Medical Record [id number] Patient Account Number: [id number] Date of Birth/Sex: Treating RN: 27-Jan-1981 (38 y.o. [name] Primary Care Shantika Bermea: Juluis Mire Other Clinician: Referring Cailin Gebel: Treating Rockne Dearinger/Extender:Robson, Melanie Crazier, Zenaida Niece in Treatment: 10 Compression Therapy Performed for Wound Wound #2  Right,Medial Lower Leg Assessment: Performed By: Clinician Levan Hurst, RN Compression Type: Three Layer Post Procedure Diagnosis Same as Pre-procedure Electronic Signature(s) Signed: 09/25/2019 6:17:12 PM By: Levan Hurst RN, BSN Entered By: Levan Hurst on 09/25/2019 13:16:35 -------------------------------------------------------------------------------- Encounter Discharge Information Details Patient Name: Date of Service: Lindsay Heater D. 09/25/2019 12:45 PM Medical Record [id number] Patient Account Number: [id number] Date of Birth/Sex: Treating RN: 01/09/1981 (38 y.o. [name] Primary Care Emry Barbato: Juluis Mire Other Clinician: Referring Mahaley Schwering: Treating Derwood Becraft/Extender:Robson, Melanie Crazier, Zenaida Niece in Treatment: 10 Encounter Discharge Information Items Discharge Condition: Stable Ambulatory Status: Ambulatory Discharge Destination: Home Transportation: Private Auto Accompanied By: alone Schedule Follow-up Appointment: Yes Clinical Summary of Care: Patient Declined Electronic Signature(s) Signed: 09/25/2019 6:17:12 PM By: Levan Hurst RN, BSN Entered By: Levan Hurst on 09/25/2019 18:09:31 -------------------------------------------------------------------------------- Lower Extremity Assessment Details Patient Name: Date of Service: Lindsay Heater D. 09/25/2019 12:45 PM Medical Record [id number] Patient Account Number: [id number] Date of Birth/Sex: Treating RN: 1981/07/19 (38 y.o. Debby Bud Primary Care Neeley Sedivy: Juluis Mire Other Clinician: Referring Gardner Servantes: Treating Alejandra Hunt/Extender:Robson, Melanie Crazier, Zenaida Niece in Treatment: 10 Edema Assessment Assessed: [Left: No] [Right: Yes] Edema: [Left: Ye] [Right: s] Calf Left: Right: Point of Measurement: 39 cm From Medial Instep cm 46 cm Ankle Left: Right: Point of Measurement: 12 cm From Medial Instep cm 25.5 cm Vascular  Assessment Pulses: Dorsalis Pedis Palpable: [Right:Yes] Electronic Signature(s) Signed: 09/25/2019 5:53:08 PM By: Deon Pilling Entered By: Deon Pilling on 09/25/2019 13:05:13 -------------------------------------------------------------------------------- Multi Wound Chart Details Patient Name: Date of Service: Lindsay Heater D. 09/25/2019 12:45 PM Medical Record [id number] Patient Account Number: [id number] Date of Birth/Sex: Treating RN: 11-Jun-1981 (38 y.o. [name] Primary Care Sekai Gitlin: Juluis Mire Other Clinician: Referring Riyan Haile: Treating Jacorey Donaway/Extender:Robson, Melanie Crazier, Zenaida Niece in Treatment: 10 Vital Signs Height(in): 67 Pulse(bpm): 46 Weight(lbs): 599 Blood Pressure(mmHg): 127/72 Body Mass Index(BMI): 65 Temperature(F): 98.2 Respiratory 16 Rate(breaths/min): Photos: [2:No Photos] [N/A:N/A] Wound Location: [2:Right Lower Leg - Medial N/A] Wounding Event: [2:Gradually Appeared] [N/A:N/A] Primary Etiology: [2:Venous Leg Ulcer] [N/A:N/A] Comorbid  Lindsay [name], Lindsay [name] ([id number]) Visit Report for 09/25/2019 Arrival Information Details Patient Name: Date of Service: Lindsay [name], Lindsay [name] 09/25/2019 12:45 PM Medical Record [id number] Patient Account Number: [id number] Date of Birth/Sex: Treating RN: 1981/05/21 (38 y.o. Helene Shoe, Meta.Reding Primary Care Yeng Frankie: Juluis Mire Other Clinician: Referring Mariajose Mow: Treating Dakoda Bassette/Extender:Robson, Melanie Crazier, Zenaida Niece in Treatment: 10 Visit Information History Since Last Visit Cane Added or deleted any medications: No Patient Arrived: 12:56 Any new allergies or adverse reactions: No Arrival Time: Had a fall or experienced change in No Accompanied By: self None activities of daily living that may affect Transfer Assistance: risk of falls: Patient Identification Verified: Yes Signs or symptoms of abuse/neglect since last No Secondary Verification Process Completed: Yes visito Patient Requires Transmission-Based No Hospitalized since last visit: No Precautions: Implantable device outside of the clinic excluding No Patient Has Alerts: No cellular tissue based products placed in the center since last visit: Has Dressing in Place as Prescribed: Yes Has Compression in Place as Prescribed: Yes Pain Present Now: Yes Electronic Signature(s) Signed: 09/25/2019 5:53:08 PM By: Deon Pilling Entered By: Deon Pilling on 09/25/2019 12:56:52 -------------------------------------------------------------------------------- Compression Therapy Details Patient Name: Date of Service: Lindsay [name] 09/25/2019 12:45 PM Medical Record [id number] Patient Account Number: [id number] Date of Birth/Sex: Treating RN: 27-Jan-1981 (38 y.o. [name] Primary Care Shantika Bermea: Juluis Mire Other Clinician: Referring Cailin Gebel: Treating Rockne Dearinger/Extender:Robson, Melanie Crazier, Zenaida Niece in Treatment: 10 Compression Therapy Performed for Wound Wound #2  Right,Medial Lower Leg Assessment: Performed By: Clinician Levan Hurst, RN Compression Type: Three Layer Post Procedure Diagnosis Same as Pre-procedure Electronic Signature(s) Signed: 09/25/2019 6:17:12 PM By: Levan Hurst RN, BSN Entered By: Levan Hurst on 09/25/2019 13:16:35 -------------------------------------------------------------------------------- Encounter Discharge Information Details Patient Name: Date of Service: Lindsay Heater D. 09/25/2019 12:45 PM Medical Record [id number] Patient Account Number: [id number] Date of Birth/Sex: Treating RN: 01/09/1981 (38 y.o. [name] Primary Care Emry Barbato: Juluis Mire Other Clinician: Referring Mahaley Schwering: Treating Derwood Becraft/Extender:Robson, Melanie Crazier, Zenaida Niece in Treatment: 10 Encounter Discharge Information Items Discharge Condition: Stable Ambulatory Status: Ambulatory Discharge Destination: Home Transportation: Private Auto Accompanied By: alone Schedule Follow-up Appointment: Yes Clinical Summary of Care: Patient Declined Electronic Signature(s) Signed: 09/25/2019 6:17:12 PM By: Levan Hurst RN, BSN Entered By: Levan Hurst on 09/25/2019 18:09:31 -------------------------------------------------------------------------------- Lower Extremity Assessment Details Patient Name: Date of Service: Lindsay Heater D. 09/25/2019 12:45 PM Medical Record [id number] Patient Account Number: [id number] Date of Birth/Sex: Treating RN: 1981/07/19 (38 y.o. Debby Bud Primary Care Neeley Sedivy: Juluis Mire Other Clinician: Referring Gardner Servantes: Treating Alejandra Hunt/Extender:Robson, Melanie Crazier, Zenaida Niece in Treatment: 10 Edema Assessment Assessed: [Left: No] [Right: Yes] Edema: [Left: Ye] [Right: s] Calf Left: Right: Point of Measurement: 39 cm From Medial Instep cm 46 cm Ankle Left: Right: Point of Measurement: 12 cm From Medial Instep cm 25.5 cm Vascular  Assessment Pulses: Dorsalis Pedis Palpable: [Right:Yes] Electronic Signature(s) Signed: 09/25/2019 5:53:08 PM By: Deon Pilling Entered By: Deon Pilling on 09/25/2019 13:05:13 -------------------------------------------------------------------------------- Multi Wound Chart Details Patient Name: Date of Service: Lindsay Heater D. 09/25/2019 12:45 PM Medical Record [id number] Patient Account Number: [id number] Date of Birth/Sex: Treating RN: 11-Jun-1981 (38 y.o. [name] Primary Care Sekai Gitlin: Juluis Mire Other Clinician: Referring Riyan Haile: Treating Jacorey Donaway/Extender:Robson, Melanie Crazier, Zenaida Niece in Treatment: 10 Vital Signs Height(in): 67 Pulse(bpm): 46 Weight(lbs): 599 Blood Pressure(mmHg): 127/72 Body Mass Index(BMI): 65 Temperature(F): 98.2 Respiratory 16 Rate(breaths/min): Photos: [2:No Photos] [N/A:N/A] Wound Location: [2:Right Lower Leg - Medial N/A] Wounding Event: [2:Gradually Appeared] [N/A:N/A] Primary Etiology: [2:Venous Leg Ulcer] [N/A:N/A] Comorbid  History: [2:Lymphedema, Asthma, Congestive Heart Failure, Coronary Artery Disease, Hypertension, Myocardial Infarction, Hepatitis B, Type II Diabetes, Neuropathy] [N/A:N/A] Date Acquired: [2:07/07/2019] [N/A:N/A] Weeks of Treatment: [2:10] [N/A:N/A] Wound Status: [2:Open] [N/A:N/A] Measurements L x W x D 0.7x0.7x0.1 [N/A:N/A] (cm) Area (cm) : [2:0.385] [N/A:N/A] Volume (cm) : [2:0.038] [N/A:N/A] % Reduction in Area: [2:88.90%] [N/A:N/A] % Reduction in Volume: [2:89.00%] [N/A:N/A] Classification: [2:Full Thickness Without Exposed Support Structures] [N/A:N/A] Exudate Amount: [2:Medium] [N/A:N/A] Exudate Type: [2:Serosanguineous] [N/A:N/A] Exudate Color: [2:red, brown] [N/A:N/A] Wound Margin: [2:Flat and Intact] [N/A:N/A] Granulation Amount: [2:Medium (34-66%)] [N/A:N/A] Granulation Quality: [2:Red, Pink] [N/A:N/A] Necrotic Amount: [2:Medium (34-66%)] [N/A:N/A] Exposed  Structures: [2:Fat Layer (Subcutaneous N/A Tissue) Exposed: Yes Fascia: No Tendon: No Muscle: No Joint: No Bone: No] Epithelialization: [2:Medium (34-66%) Compression Therapy] [N/A:N/A N/A] Treatment Notes Electronic Signature(s) Signed: 09/25/2019 6:11:18 PM By: Linton Ham MD Signed: 09/25/2019 6:17:12 PM By: Levan Hurst RN, BSN Entered By: Linton Ham on 09/25/2019 13:19:21 -------------------------------------------------------------------------------- Multi-Disciplinary Care Plan Details Patient Name: Date of Service: Lindsay Heater D. 09/25/2019 12:45 PM Medical Record [id number] Patient Account Number: [id number] Date of Birth/Sex: Treating RN: 03/22/1981 (38 y.o. [name] Primary Care Ahmar Pickrell: Juluis Mire Other Clinician: Referring Tait Balistreri: Treating Davarius Ridener/Extender:Robson, Melanie Crazier, Zenaida Niece in Treatment: 10 Active Inactive Wound/Skin Impairment Nursing Diagnoses: Impaired tissue integrity Knowledge deficit related to ulceration/compromised skin integrity Goals: Patient/caregiver will verbalize understanding of skin care regimen Date Initiated: 07/17/2019 Target Resolution Date: 10/13/2019 Goal Status: Active Ulcer/skin breakdown will have a volume reduction of 30% by week 4 Date Initiated: 07/17/2019 Date Inactivated: 08/14/2019 Target Resolution Date: 08/18/2019 Goal Status: Met Ulcer/skin breakdown will have a volume reduction of 50% by week 8 Date Initiated: 08/14/2019 Date Inactivated: 09/11/2019 Target Resolution Date: 09/15/2019 Goal Status: Met Ulcer/skin breakdown will have a volume reduction of 80% by week 12 Date Initiated: 09/11/2019 Target Resolution Date: 10/13/2019 Goal Status: Active Interventions: Assess patient/caregiver ability to obtain necessary supplies Assess patient/caregiver ability to perform ulcer/skin care regimen upon admission and as needed Assess ulceration(s) every visit Provide education on  ulcer and skin care Notes: Electronic Signature(s) Signed: 09/25/2019 6:17:12 PM By: Levan Hurst RN, BSN Entered By: Levan Hurst on 09/25/2019 13:15:54 -------------------------------------------------------------------------------- Pain Assessment Details Patient Name: Date of Service: Lindsay Heater D. 09/25/2019 12:45 PM Medical Record [id number] Patient Account Number: [id number] Date of Birth/Sex: Treating RN: 1981-08-18 (38 y.o. Debby Bud Primary Care Korynn Kenedy: Juluis Mire Other Clinician: Referring Lathaniel Legate: Treating Taichi Repka/Extender:Robson, Melanie Crazier, Zenaida Niece in Treatment: 10 Active Problems Location of Pain Severity and Description of Pain Patient Has Paino No Site Locations Rate the pain. Current Pain Level: 0 Pain Management and Medication Current Pain Management: Medication: No Cold Application: No Rest: No Massage: No Activity: No T.E.N.S.: No Heat Application: No Leg drop or elevation: No Is the Current Pain Management Adequate: Adequate How does your wound impact your activities of daily livingo Sleep: No Bathing: No Appetite: No Relationship With Others: No Bladder Continence: No Emotions: No Bowel Continence: No Work: No Toileting: No Drive: No Dressing: No Hobbies: No Electronic Signature(s) Signed: 09/25/2019 5:53:08 PM By: Deon Pilling Entered By: Deon Pilling on 09/25/2019 13:07:57 -------------------------------------------------------------------------------- Patient/Caregiver Education Details Patient Name: Date of Service: [name] 10/5/2020andnbsp12:45 PM Medical Record [id number] Patient Account Number: [id number] Date of Birth/Gender: Treating RN: July 03, 1981 (38 y.o. [name] Primary Care Physician: Juluis Mire Other Clinician: Referring Physician: Treating Physician/Extender:Robson, Melanie Crazier, Zenaida Niece in Treatment: 10 Education  Assessment Education Provided To: Patient Education Topics Provided Wound/Skin Impairment: Methods: Explain/Verbal Responses: State

## 2019-09-25 NOTE — Progress Notes (Signed)
ANN-MARIE, KLUGE ([id number]) Visit Report for 09/25/2019 HPI Details Patient Name: Date of Service: Lindsay, [name] 09/25/2019 12:45 PM Medical Record [id number] Patient Account Number: [id number] Date of Birth/Sex: Treating RN: [birth year] (38 y.o. Lindsay [name] Primary Care Provider: Gwinda Passe Other Clinician: Referring Provider: Treating Provider/Extender:Robson, Waymon Budge, Casey Burkitt in Treatment: 10 History of Present Illness HPI Description: ADMISSION 02/20/2019 This is a 38 year old woman with type 2 diabetes and peripheral neuropathy. She is here for review of a wound on her right medial leg for 6 weeks. She states this began in late January when she noticed an abscess on her right leg. She was seen in the ER. A culture showed a few group B strep. She had been treated with Bactrim and Keflex for 10 days as well as mupirocin. An x-ray was negative. The patient states she feels this was an abscess of a raised fluid filled area that got larger and burst on its own. She tells me she has had a history of abscesses the worst of which was in the right shoulder region that became associated with septic arthritis. She has been applying bacitracin to the wound. She followed up on 2/3 in the ER. Her Bactrim was extended. By this point a picture shows a necrotic looking surface. Past medical history; type 2 diabetes with a history of peripheral neuropathy, hypertension, PAD morbid obesity, coronary artery disease status post PCI and drug-eluting stent, pressure ulcer, hypertension, septic arthritis of the left shoulder, hepatitis B positive ABIs in our clinic were noncompressible on the right 3/9; not much change in the area on the right medial calf. We use silver alginate under 3 layer compression. Much better edema control. 3/16-Apparently slight change in the area on the right medial calf with perhaps minimal improvement, silver alginate and a 3 layer  compression for 1 week was done. Edema control appears to be adequate. We will continue with the Hydrofer blue and 3 layer compression. ABIs were reviewed. Patient denies much pain over the week and in fact has been fairly comfortable under the 3 layer.Patient states that blood sugar control has been reasonably improved with a fasting blood sugar ranging from 90-130. 3/23; patient continues to do nicely right medial calf. The wound is epithelializing. We have been using Hydrofera Blue under 3 layer compression. She does not report pain or excessive drainage. She is going to need graded pressure stockings and I began the discussion here. 3/30; right medial calf wound continues to make good progress. We have been using Hydrofera Blue under 3 layer compression 4/6; right medial calf wound continues to make good progress this was initially secondary to an abscess that required an IandD. 4/13; right medial calf. About 50% epithelialized. Using Hydrofera Blue under compression 4/20; right medial calf measuring smaller. We have using Hydrofera Blue under compression. Initially an abscess that required IandD 4/27; right medial calf. Comes in with eschar over a wide amount of the surface of what is left of this wound we have been using Hydrofera Blue under compression 5/4; right medial calf. The wound is totally epithelialized and healed. Initially an abscess READMISSION 07/17/19 Patient returns again with a wound on the same spot in the right medial calf. She states that this is been there for about a week and a half. She noted swelling in her leg prior to that and she could not get her stocking on. I am not completely certain what type of stockings she has. She has been applying peroxide and  a dry dressing. Past medical history; she is a diabetic with a recent hemoglobin A1c of 9. She has a history of peripheral neuropathy and PAD. Morbid obesity, history of abscesses with septic arthritis in her  shoulder, hepatitis B positive, asthma, non-ST elevation MI and low back pain Arterial; the patient is noncompressible in the right leg. She is listed in the history of having PAD. I did not see that she has had any formal arterial studies. We did not order venous reflux studies on her. 07/24/19-Wound looks slightly longer in dimensions, started off with silver alginate, 3 layer compression. 8/10-Patient returns at 1 week, she is in 3 layer compression, we moved her to [facility], the wound is stable perhaps marginally smaller if any 8/17 using Hydrofera Blue and 3 layer compression. No major change. I reviewed [place] length I do not see that the patient is ever had arterial studies or venous reflux studies although she has had DVT rule outs but nothing recently 8/24; using Hydrofera Blue and 3 layer compression. The patient's wound is measuring smaller. 8/31 using Hydrofera Blue. No major change in dimensions. She has a rim of epithelialization but it is not progressed 9/14; using Hydrofera Blue. Dimensions continue to progress reasonably nicely. We have her in compression. This is a reopening of a wound in the same area. After her last admission to this clinic she graduated to stocking she bought at [employer]. Even then she admits that she did not start using them until she developed swelling and by that time the wound had already opened. 9/21; using Hydrofera Blue. Dimensions did not come down so much this week. Debris on the wound surface. 9/28; using Hydrofera Blue dimensions of come down nicely. Edema control is quite good 10/5; using Hydrofera Blue dimensions are down this week but certainly the wound is not healed. Her reflux studies are on 10/15 Electronic Signature(s) Signed: 09/25/2019 6:11:18 PM By: Baltazar Najjar MD Entered By: Baltazar Najjar on 09/25/2019 13:21:54 -------------------------------------------------------------------------------- Physical Exam  Details Patient Name: Date of Service: Lindsay [name] 09/25/2019 12:45 PM Medical Record [id number] Patient Account Number: [id number] Date of Birth/Sex: Treating RN: 07/12/1981 (38 y.o. Lindsay [name] Primary Care Provider: Gwinda Passe Other Clinician: Referring Provider: Treating Provider/Extender:Robson, Waymon Budge, Casey Burkitt in Treatment: 10 Constitutional Sitting or standing Blood Pressure is within target range for patient.. Pulse regular and within target range for patient.Marland Kitchen Respirations regular, non-labored and within target range.. Temperature is normal and within the target range for the patient.Marland Kitchen Appears in no distress. Eyes Conjunctivae clear. No discharge.no icterus. Respiratory work of breathing is normal. Cardiovascular Pedal pulses palpable and strong bilaterally.. Edema present in both extremities. Nonpitting. Integumentary (Hair, Skin) No erythema or tenderness around the wound. Psychiatric appears at normal baseline. Notes Wound exam; right medial lower leg still smaller but not closed. There is minimal depth no surrounding erythema but healing in a somewhat odd manner Electronic Signature(s) Signed: 09/25/2019 6:11:18 PM By: Baltazar Najjar MD Entered By: Baltazar Najjar on 09/25/2019 13:24:05 -------------------------------------------------------------------------------- Physician Orders Details Patient Name: Date of Service: Lindsay Fritz D. 09/25/2019 12:45 PM Medical Record [id number] Patient Account Number: [id number] Date of Birth/Sex: Treating RN: 07-19-1981 (38 y.o. Lindsay [name] Primary Care Provider: Gwinda Passe Other Clinician: Referring Provider: Treating Provider/Extender:Robson, Waymon Budge, Casey Burkitt in Treatment: 10 Verbal / Phone Orders: No Diagnosis Coding ICD-10 Coding Code Description I89.0 Lymphedema, not elsewhere classified L97.211 Non-pressure chronic ulcer of right calf  limited to breakdown of skin E11.622 Type 2  diabetes mellitus with other skin ulcer Follow-up Appointments Return Appointment in 1 week. - Monday Dressing Change Frequency Wound #2 Right,Medial Lower Leg Do not change entire dressing for one week. Skin Barriers/Peri-Wound Care Moisturizing lotion Wound Cleansing Wound #2 Right,Medial Lower Leg May shower with protection. Primary Wound Dressing Wound #2 Right,Medial Lower Leg Hydrofera Blue - Classic moistened with saline Secondary Dressing Wound #2 Right,Medial Lower Leg Dry Gauze ABD pad - fold ABD to provide extra compression to wound Edema Control 3 Layer Compression System - Right Lower Extremity Avoid standing for long periods of time Elevate legs to the level of the heart or above for 30 minutes daily and/or when sitting, a frequency of: - throughout the day Exercise regularly Electronic Signature(s) Signed: 09/25/2019 6:11:18 PM By: Baltazar Najjar MD Signed: 09/25/2019 6:17:12 PM By: Zandra Abts RN, BSN Entered By: Zandra Abts on 09/25/2019 13:15:46 -------------------------------------------------------------------------------- Problem List Details Patient Name: Date of Service: Lindsay Fritz D. 09/25/2019 12:45 PM Medical Record [id number] Patient Account Number: [id number] Date of Birth/Sex: Treating RN: 1981/03/10 (38 y.o. Lindsay [name] Primary Care Provider: Gwinda Passe Other Clinician: Referring Provider: Treating Provider/Extender:Robson, Waymon Budge, Casey Burkitt in Treatment: 10 Active Problems ICD-10 Evaluated Encounter Code Description Active Date Today Diagnosis I89.0 Lymphedema, not elsewhere classified 07/17/2019 No Yes L97.211 Non-pressure chronic ulcer of right calf limited to 07/17/2019 No Yes breakdown of skin E11.622 Type 2 diabetes mellitus with other skin ulcer 07/17/2019 No Yes Inactive Problems Resolved Problems Electronic Signature(s) Signed: 09/25/2019  6:11:18 PM By: Baltazar Najjar MD Entered By: Baltazar Najjar on 09/25/2019 13:18:14 -------------------------------------------------------------------------------- Progress Note Details Patient Name: Date of Service: Lindsay Fritz D. 09/25/2019 12:45 PM Medical Record [id number] Patient Account Number: [id number] Date of Birth/Sex: Treating RN: [birth year] (38 y.o. Lindsay [name] Primary Care Provider: Gwinda Passe Other Clinician: Referring Provider: Treating Provider/Extender:Robson, Waymon Budge, Casey Burkitt in Treatment: 10 Subjective History of Present Illness (HPI) ADMISSION 02/20/2019 This is a 38 year old woman with type 2 diabetes and peripheral neuropathy. She is here for review of a wound on her right medial leg for 6 weeks. She states this began in late January when she noticed an abscess on her right leg. She was seen in the ER. A culture showed a few group B strep. She had been treated with Bactrim and Keflex for 10 days as well as mupirocin. An x-ray was negative. The patient states she feels this was an abscess of a raised fluid filled area that got larger and burst on its own. She tells me she has had a history of abscesses the worst of which was in the right shoulder region that became associated with septic arthritis. She has been applying bacitracin to the wound. She followed up on 2/3 in the ER. Her Bactrim was extended. By this point a picture shows a necrotic looking surface. Past medical history; type 2 diabetes with a history of peripheral neuropathy, hypertension, PAD morbid obesity, coronary artery disease status post PCI and drug-eluting stent, pressure ulcer, hypertension, septic arthritis of the left shoulder, hepatitis B positive ABIs in our clinic were noncompressible on the right 3/9; not much change in the area on the right medial calf. We use silver alginate under 3 layer compression. Much better edema control. 3/16-Apparently  slight change in the area on the right medial calf with perhaps minimal improvement, silver alginate and a 3 layer compression for 1 week was done. Edema control appears to be adequate. We will continue with the Hydrofer blue and 3 layer compression.  ABIs were reviewed. Patient denies much pain over the week and in fact has been fairly comfortable under the 3 layer.Patient states that blood sugar control has been reasonably improved with a fasting blood sugar ranging from 90-130. 3/23; patient continues to do nicely right medial calf. The wound is epithelializing. We have been using Hydrofera Blue under 3 layer compression. She does not report pain or excessive drainage. She is going to need graded pressure stockings and I began the discussion here. 3/30; right medial calf wound continues to make good progress. We have been using Hydrofera Blue under 3 layer compression 4/6; right medial calf wound continues to make good progress this was initially secondary to an abscess that required an IandD. 4/13; right medial calf. About 50% epithelialized. Using Hydrofera Blue under compression 4/20; right medial calf measuring smaller. We have using Hydrofera Blue under compression. Initially an abscess that required IandD 4/27; right medial calf. Comes in with eschar over a wide amount of the surface of what is left of this wound we have been using Hydrofera Blue under compression 5/4; right medial calf. The wound is totally epithelialized and healed. Initially an abscess READMISSION 07/17/19 Patient returns again with a wound on the same spot in the right medial calf. She states that this is been there for about a week and a half. She noted swelling in her leg prior to that and she could not get her stocking on. I am not completely certain what type of stockings she has. She has been applying peroxide and a dry dressing. Past medical history; she is a diabetic with a recent hemoglobin A1c of 9. She has  a history of peripheral neuropathy and PAD. Morbid obesity, history of abscesses with septic arthritis in her shoulder, hepatitis B positive, asthma, non-ST elevation MI and low back pain Arterial; the patient is noncompressible in the right leg. She is listed in the history of having PAD. I did not see that she has had any formal arterial studies. We did not order venous reflux studies on her. 07/24/19-Wound looks slightly longer in dimensions, started off with silver alginate, 3 layer compression. 8/10-Patient returns at 1 week, she is in 3 layer compression, [facility] the wound is stable perhaps marginally smaller if any 8/17 using Hydrofera Blue and 3 layer compression. No major change. I reviewed  length I do not see that the patient is ever had arterial studies or venous reflux studies although she has had DVT rule outs but nothing recently 8/24; using Hydrofera Blue and 3 layer compression. The patient's wound is measuring smaller. 8/31 using Hydrofera Blue. No major change in dimensions. She has a rim of epithelialization but it is not progressed 9/14; using Hydrofera Blue. Dimensions continue to progress reasonably nicely. We have her in compression. This is a reopening of a wound in the same area. After her last admission to this clinic she graduated to stocking she bought at [employer]. Even then she admits that she did not start using them until she developed swelling and by that time the wound had already opened. 9/21; using Hydrofera Blue. Dimensions did not come down so much this week. Debris on the wound surface. 9/28; using Hydrofera Blue dimensions of come down nicely. Edema control is quite good 10/5; using Hydrofera Blue dimensions are down this week but certainly the wound is not healed. Her reflux studies are on 10/15 Objective Constitutional Sitting or standing Blood Pressure is within target range for patient..Marland Kitchen  Pulse regular and within  target range for patient.Marland Kitchen Respirations regular, non-labored and within target range.. Temperature is normal and within the target range for the patient.Marland Kitchen Appears in no distress. Vitals Time Taken: 1:00 PM, Height: 67 in, Weight: 413 lbs, BMI: 64.7, Temperature: 98.2 F, Pulse: 73 bpm, Respiratory Rate: 16 breaths/min, Blood Pressure: 127/72 mmHg. Eyes Conjunctivae clear. No discharge.no icterus. Respiratory work of breathing is normal. Cardiovascular Pedal pulses palpable and strong bilaterally.. Edema present in both extremities. Nonpitting. Psychiatric appears at normal baseline. General Notes: Wound exam; right medial lower leg still smaller but not closed. There is minimal depth no surrounding erythema but healing in a somewhat odd manner Integumentary (Hair, Skin) No erythema or tenderness around the wound. Wound #2 status is Open. Original cause of wound was Gradually Appeared. The wound is located on the Right,Medial Lower Leg. The wound measures 0.7cm length x 0.7cm width x 0.1cm depth; 0.385cm^2 area and 0.038cm^3 volume. There is Fat Layer (Subcutaneous Tissue) Exposed exposed. There is no tunneling or undermining noted. There is a medium amount of serosanguineous drainage noted. The wound margin is flat and intact. There is medium (34-66%) red, pink granulation within the wound bed. There is a medium (34-66%) amount of necrotic tissue within the wound bed including Adherent Slough. Assessment Active Problems ICD-10 Lymphedema, not elsewhere classified Non-pressure chronic ulcer of right calf limited to breakdown of skin Type 2 diabetes mellitus with other skin ulcer Procedures Wound #2 Pre-procedure diagnosis of Wound #2 is a Venous Leg Ulcer located on the Right,Medial Lower Leg . There was a Three Layer Compression Therapy Procedure by Levan Hurst, RN. Post procedure Diagnosis Wound #2: Same as Pre-Procedure Plan Follow-up Appointments: Return Appointment in 1  week. - Monday Dressing Change Frequency: Wound #2 Right,Medial Lower Leg: Do not change entire dressing for one week. Skin Barriers/Peri-Wound Care: Moisturizing lotion Wound Cleansing: Wound #2 Right,Medial Lower Leg: May shower with protection. Primary Wound Dressing: Wound #2 Right,Medial Lower Leg: Hydrofera Blue - Classic moistened with saline Secondary Dressing: Wound #2 Right,Medial Lower Leg: Dry Gauze ABD pad - fold ABD to provide extra compression to wound Edema Control: 3 Layer Compression System - Right Lower Extremity Avoid standing for long periods of time Elevate legs to the level of the heart or above for 30 minutes daily and/or when sitting, a frequency of: - throughout the day Exercise regularly 1. Right medial lower leg continue Hydrofera Blue under 3 layer compression 2. The patient has venous reflux studies on 10/15 3. This looks like it is progressing towards closing but this is the second go around of this wound in the same position Electronic Signature(s) Signed: 09/25/2019 6:11:18 PM By: Linton Ham MD Entered By: Linton Ham on 09/25/2019 13:26:21 -------------------------------------------------------------------------------- SuperBill Details Patient Name: Date of Service: Lindsay [name] 09/25/2019 Medical Record [id number] Patient Account Number: [id number] Date of Birth/Sex: Treating RN: May 05, 1981 (38 y.o. [name] Primary Care Provider: Juluis Mire Other Clinician: Referring Provider: Treating Provider/Extender:Robson, Melanie Crazier, Zenaida Niece in Treatment: 10 Diagnosis Coding ICD-10 Codes Code Description I89.0 Lymphedema, not elsewhere classified L97.211 Non-pressure chronic ulcer of right calf limited to breakdown of skin E11.622 Type 2 diabetes mellitus with other skin ulcer Facility Procedures CPT4 Code Description: [id number] (Facility Use Only) [phone] - APPLY MULTLAY COMPRS LWR RT  LEG Modifier: Quantity: 1 Physician Procedures CPT4 Code Description: [id number] - WC PHYS LEVEL 3 - EST PT ICD-10 Diagnosis Description L97.211 Non-pressure chronic ulcer of right calf limited to breakd I89.0 Lymphedema,  not elsewhere classified Modifier: own of skin Quantity: 1 Electronic Signature(s) Signed: 09/25/2019 6:11:18 PM By: Baltazar Najjarobson, Michael MD Signed: 09/25/2019 6:17:12 PM By: Zandra AbtsLynch, Shatara RN, BSN Entered By: Zandra AbtsLynch, Shatara on 09/25/2019 18:08:36

## 2019-09-28 NOTE — Progress Notes (Signed)
a dry dressing. Past medical history; she is a diabetic with a recent hemoglobin A1c of 9. She has a history of peripheral neuropathy and PAD. Morbid obesity, history of abscesses with septic arthritis in her  shoulder, hepatitis B positive, asthma, non-ST elevation MI and low back pain Arterial; the patient is noncompressible in the right leg. She is listed in the history of having PAD. I did not see that she has had any formal arterial studies. We did not order venous reflux studies on her. 07/24/19-Wound looks slightly longer in dimensions, started off with silver alginate, 3 layer compression. 8/10-Patient returns at 1 week, she is in 3 layer compression, we moved her to [facility], the wound is stable perhaps marginally smaller if any 8/17 using Hydrofera Blue and 3 layer compression. No major change. I reviewed Progress length I do not see that the patient is ever had arterial studies or venous reflux studies although she has had DVT rule outs but nothing recently 8/24; using Hydrofera Blue and 3 layer compression. The patient's wound is measuring smaller. 8/31 using Hydrofera Blue. No major change in dimensions. She has a rim of epithelialization but it is not progressed 9/14; using Hydrofera Blue. Dimensions continue to progress reasonably nicely. We have her in compression. This is a reopening of a wound in the same area. After her last admission to this clinic she graduated to stocking she bought at [employer]. Even then she admits that she did not start using them until she developed swelling and by that time the wound had already opened. 9/21; using Hydrofera Blue. Dimensions did not come down so much this week. Debris on the wound surface. 9/28; using Hydrofera Blue dimensions of come down nicely. Edema control is quite good Psychologist, prison and probation services) Signed: 09/18/2019 6:01:32 PM By: Baltazar Najjar MD Entered By: Baltazar Najjar on 09/18/2019 12:57:05 -------------------------------------------------------------------------------- Physical Exam Details Patient Name: Date of Service: Lindsay [name]. 09/18/2019 11:00 AM Medical Record [id number] Patient Account Number:  [id number] Date of Birth/Sex: Treating RN: 1981-06-21 (38 y.o. Lindsay [name] Primary Care Provider: Gwinda Passe Other Clinician: Referring Provider: Treating Provider/Extender:Aivy Akter, Waymon Budge, Casey Burkitt in Treatment: 9 Constitutional Sitting or standing Blood Pressure is within target range for patient.. Pulse regular and within target range for patient.Marland Kitchen Respirations regular, non-labored and within target range.. Temperature is normal and within the target range for the patient.Marland Kitchen Appears in no distress. Eyes Conjunctivae clear. No discharge.no icterus. Respiratory work of breathing is normal. Cardiovascular Pedal pulses are palpable. Lymphatic None palpable in the popliteal area. Integumentary (Hair, Skin) There is no erythema or tenderness around the wound. Psychiatric appears at normal baseline. Notes Wound exam; right medial lower leg. Much smaller healthy looking surface still 2 mm in depth. Electronic Signature(s) Signed: 09/18/2019 6:01:32 PM By: Baltazar Najjar MD Entered By: Baltazar Najjar on 09/18/2019 13:01:08 -------------------------------------------------------------------------------- Physician Orders Details Patient Name: Date of Service: Lindsay [name]. 09/18/2019 11:00 AM Medical Record [id number] Patient Account Number: [id number] Date of Birth/Sex: Treating RN: Apr 15, 1981 (38 y.o. Harvest Dark Primary Care Provider: Gwinda Passe Other Clinician: Referring Provider: Treating Provider/Extender:Zamaria Brazzle, Waymon Budge, Casey Burkitt in Treatment: 9 Verbal / Phone Orders: No Diagnosis Coding Follow-up Appointments Return Appointment in 1 week. - Monday Dressing Change Frequency Wound #2 Right,Medial Lower Leg Do not change entire dressing for one week. Skin Barriers/Peri-Wound Care Moisturizing lotion Wound Cleansing Wound #2 Right,Medial Lower Leg May shower with protection. Primary Wound  Dressing Wound #2 Right,Medial Lower Leg Hydrofera Blue - Classic moistened  a dry dressing. Past medical history; she is a diabetic with a recent hemoglobin A1c of 9. She has a history of peripheral neuropathy and PAD. Morbid obesity, history of abscesses with septic arthritis in her  shoulder, hepatitis B positive, asthma, non-ST elevation MI and low back pain Arterial; the patient is noncompressible in the right leg. She is listed in the history of having PAD. I did not see that she has had any formal arterial studies. We did not order venous reflux studies on her. 07/24/19-Wound looks slightly longer in dimensions, started off with silver alginate, 3 layer compression. 8/10-Patient returns at 1 week, she is in 3 layer compression, we moved her to [facility], the wound is stable perhaps marginally smaller if any 8/17 using Hydrofera Blue and 3 layer compression. No major change. I reviewed Progress length I do not see that the patient is ever had arterial studies or venous reflux studies although she has had DVT rule outs but nothing recently 8/24; using Hydrofera Blue and 3 layer compression. The patient's wound is measuring smaller. 8/31 using Hydrofera Blue. No major change in dimensions. She has a rim of epithelialization but it is not progressed 9/14; using Hydrofera Blue. Dimensions continue to progress reasonably nicely. We have her in compression. This is a reopening of a wound in the same area. After her last admission to this clinic she graduated to stocking she bought at [employer]. Even then she admits that she did not start using them until she developed swelling and by that time the wound had already opened. 9/21; using Hydrofera Blue. Dimensions did not come down so much this week. Debris on the wound surface. 9/28; using Hydrofera Blue dimensions of come down nicely. Edema control is quite good Psychologist, prison and probation services) Signed: 09/18/2019 6:01:32 PM By: Baltazar Najjar MD Entered By: Baltazar Najjar on 09/18/2019 12:57:05 -------------------------------------------------------------------------------- Physical Exam Details Patient Name: Date of Service: Lindsay [name]. 09/18/2019 11:00 AM Medical Record [id number] Patient Account Number:  [id number] Date of Birth/Sex: Treating RN: 1981-06-21 (38 y.o. Lindsay [name] Primary Care Provider: Gwinda Passe Other Clinician: Referring Provider: Treating Provider/Extender:Aivy Akter, Waymon Budge, Casey Burkitt in Treatment: 9 Constitutional Sitting or standing Blood Pressure is within target range for patient.. Pulse regular and within target range for patient.Marland Kitchen Respirations regular, non-labored and within target range.. Temperature is normal and within the target range for the patient.Marland Kitchen Appears in no distress. Eyes Conjunctivae clear. No discharge.no icterus. Respiratory work of breathing is normal. Cardiovascular Pedal pulses are palpable. Lymphatic None palpable in the popliteal area. Integumentary (Hair, Skin) There is no erythema or tenderness around the wound. Psychiatric appears at normal baseline. Notes Wound exam; right medial lower leg. Much smaller healthy looking surface still 2 mm in depth. Electronic Signature(s) Signed: 09/18/2019 6:01:32 PM By: Baltazar Najjar MD Entered By: Baltazar Najjar on 09/18/2019 13:01:08 -------------------------------------------------------------------------------- Physician Orders Details Patient Name: Date of Service: Lindsay [name]. 09/18/2019 11:00 AM Medical Record [id number] Patient Account Number: [id number] Date of Birth/Sex: Treating RN: Apr 15, 1981 (38 y.o. Harvest Dark Primary Care Provider: Gwinda Passe Other Clinician: Referring Provider: Treating Provider/Extender:Zamaria Brazzle, Waymon Budge, Casey Burkitt in Treatment: 9 Verbal / Phone Orders: No Diagnosis Coding Follow-up Appointments Return Appointment in 1 week. - Monday Dressing Change Frequency Wound #2 Right,Medial Lower Leg Do not change entire dressing for one week. Skin Barriers/Peri-Wound Care Moisturizing lotion Wound Cleansing Wound #2 Right,Medial Lower Leg May shower with protection. Primary Wound  Dressing Wound #2 Right,Medial Lower Leg Hydrofera Blue - Classic moistened  with saline Secondary Dressing Wound #2 Right,Medial Lower Leg Dry Gauze ABD pad - fold ABD to provide extra compression to wound Edema Control 3 Layer Compression System - Right Lower Extremity Avoid standing for long periods of time Elevate legs to the level of the heart or above for 30 minutes daily and/or when sitting, a frequency of: - throughout the day Exercise regularly Electronic Signature(s) Signed: 09/18/2019 6:01:32 PM By: Baltazar Najjarobson, Draven Laine MD Signed: 09/28/2019 4:32:31 PM By: Cherylin Mylarwiggins, Shannon Entered By: Cherylin Mylarwiggins, Shannon on 09/18/2019 12:11:41 -------------------------------------------------------------------------------- Problem List Details Patient Name: Date of Service: Lindsay Fritzathey, Luverne [name]. 09/18/2019 11:00 AM Medical Record [id number] Patient Account Number: [id number] Date of Birth/Sex: Treating RN: [birth year] (38 y.o. Lindsay LinkF) Lynch, Shatara Primary Care Provider: Gwinda PasseEdwards, Michelle Other Clinician: Referring Provider: Treating Provider/Extender:Abyan Cadman, Waymon BudgeMichael Edwards, Casey BurkittMichelle Weeks in Treatment: 9 Active Problems ICD-10 Evaluated Encounter Code Description Active Date Today Diagnosis I89.0 Lymphedema, not elsewhere classified 07/17/2019 No Yes L97.211 Non-pressure chronic ulcer of right calf limited to 07/17/2019 No Yes breakdown of skin E11.622 Type 2 diabetes mellitus with other skin ulcer 07/17/2019 No Yes Inactive Problems Resolved Problems Electronic Signature(s) Signed: 09/18/2019 6:01:32 PM By: Baltazar Najjarobson, Kollyn Lingafelter MD Entered By: Baltazar Najjarobson, Donnielle Addison on 09/18/2019 12:55:50 -------------------------------------------------------------------------------- Progress Note Details Patient Name: Date of Service: Lindsay Fritzathey, Meleni [name]. 09/18/2019 11:00 AM Medical Record [id number] Patient Account Number: [id number] Date of Birth/Sex: Treating RN: [birth year] (38 y.o. Lindsay LinkF) Lynch,  Shatara Primary Care Provider: Other Clinician: Gwinda PasseEdwards, Michelle Referring Provider: Treating Provider/Extender:Karanvir Balderston, Waymon BudgeMichael Edwards, Casey BurkittMichelle Weeks in Treatment: 9 Subjective History of Present Illness (HPI) ADMISSION 02/20/2019 This is a 38 year old woman with type 2 diabetes and peripheral neuropathy. She is here for review of a wound on her right medial leg for 6 weeks. She states this began in late January when she noticed an abscess on her right leg. She was seen in the ER. A culture showed a few group B strep. She had been treated with Bactrim and Keflex for 10 days as well as mupirocin. An x-ray was negative. The patient states she feels this was an abscess of a raised fluid filled area that got larger and burst on its own. She tells me she has had a history of abscesses the worst of which was in the right shoulder region that became associated with septic arthritis. She has been applying bacitracin to the wound. She followed up on 2/3 in the ER. Her Bactrim was extended. By this point a picture shows a necrotic looking surface. Past medical history; type 2 diabetes with a history of peripheral neuropathy, hypertension, PAD morbid obesity, coronary artery disease status post PCI and drug-eluting stent, pressure ulcer, hypertension, septic arthritis of the left shoulder, hepatitis B positive ABIs in our clinic were noncompressible on the right 3/9; not much change in the area on the right medial calf. We use silver alginate under 3 layer compression. Much better edema control. 3/16-Apparently slight change in the area on the right medial calf with perhaps minimal improvement, silver alginate and a 3 layer compression for 1 week was done. Edema control appears to be adequate. We will continue with the Hydrofer blue and 3 layer compression. ABIs were reviewed. Patient denies much pain over the week and in fact has been fairly comfortable under the 3 layer.Patient states that blood  sugar control has been reasonably improved with a fasting blood sugar ranging from 90-130. 3/23; patient continues to do nicely right medial calf. The wound is epithelializing. We have been using Hydrofera Blue under 3 layer compression. She  with saline Secondary Dressing Wound #2 Right,Medial Lower Leg Dry Gauze ABD pad - fold ABD to provide extra compression to wound Edema Control 3 Layer Compression System - Right Lower Extremity Avoid standing for long periods of time Elevate legs to the level of the heart or above for 30 minutes daily and/or when sitting, a frequency of: - throughout the day Exercise regularly Electronic Signature(s) Signed: 09/18/2019 6:01:32 PM By: Baltazar Najjarobson, Draven Laine MD Signed: 09/28/2019 4:32:31 PM By: Cherylin Mylarwiggins, Shannon Entered By: Cherylin Mylarwiggins, Shannon on 09/18/2019 12:11:41 -------------------------------------------------------------------------------- Problem List Details Patient Name: Date of Service: Lindsay Fritzathey, Luverne [name]. 09/18/2019 11:00 AM Medical Record [id number] Patient Account Number: [id number] Date of Birth/Sex: Treating RN: [birth year] (38 y.o. Lindsay LinkF) Lynch, Shatara Primary Care Provider: Gwinda PasseEdwards, Michelle Other Clinician: Referring Provider: Treating Provider/Extender:Abyan Cadman, Waymon BudgeMichael Edwards, Casey BurkittMichelle Weeks in Treatment: 9 Active Problems ICD-10 Evaluated Encounter Code Description Active Date Today Diagnosis I89.0 Lymphedema, not elsewhere classified 07/17/2019 No Yes L97.211 Non-pressure chronic ulcer of right calf limited to 07/17/2019 No Yes breakdown of skin E11.622 Type 2 diabetes mellitus with other skin ulcer 07/17/2019 No Yes Inactive Problems Resolved Problems Electronic Signature(s) Signed: 09/18/2019 6:01:32 PM By: Baltazar Najjarobson, Kollyn Lingafelter MD Entered By: Baltazar Najjarobson, Donnielle Addison on 09/18/2019 12:55:50 -------------------------------------------------------------------------------- Progress Note Details Patient Name: Date of Service: Lindsay Fritzathey, Meleni [name]. 09/18/2019 11:00 AM Medical Record [id number] Patient Account Number: [id number] Date of Birth/Sex: Treating RN: [birth year] (38 y.o. Lindsay LinkF) Lynch,  Shatara Primary Care Provider: Other Clinician: Gwinda PasseEdwards, Michelle Referring Provider: Treating Provider/Extender:Karanvir Balderston, Waymon BudgeMichael Edwards, Casey BurkittMichelle Weeks in Treatment: 9 Subjective History of Present Illness (HPI) ADMISSION 02/20/2019 This is a 38 year old woman with type 2 diabetes and peripheral neuropathy. She is here for review of a wound on her right medial leg for 6 weeks. She states this began in late January when she noticed an abscess on her right leg. She was seen in the ER. A culture showed a few group B strep. She had been treated with Bactrim and Keflex for 10 days as well as mupirocin. An x-ray was negative. The patient states she feels this was an abscess of a raised fluid filled area that got larger and burst on its own. She tells me she has had a history of abscesses the worst of which was in the right shoulder region that became associated with septic arthritis. She has been applying bacitracin to the wound. She followed up on 2/3 in the ER. Her Bactrim was extended. By this point a picture shows a necrotic looking surface. Past medical history; type 2 diabetes with a history of peripheral neuropathy, hypertension, PAD morbid obesity, coronary artery disease status post PCI and drug-eluting stent, pressure ulcer, hypertension, septic arthritis of the left shoulder, hepatitis B positive ABIs in our clinic were noncompressible on the right 3/9; not much change in the area on the right medial calf. We use silver alginate under 3 layer compression. Much better edema control. 3/16-Apparently slight change in the area on the right medial calf with perhaps minimal improvement, silver alginate and a 3 layer compression for 1 week was done. Edema control appears to be adequate. We will continue with the Hydrofer blue and 3 layer compression. ABIs were reviewed. Patient denies much pain over the week and in fact has been fairly comfortable under the 3 layer.Patient states that blood  sugar control has been reasonably improved with a fasting blood sugar ranging from 90-130. 3/23; patient continues to do nicely right medial calf. The wound is epithelializing. We have been using Hydrofera Blue under 3 layer compression. She  with saline Secondary Dressing Wound #2 Right,Medial Lower Leg Dry Gauze ABD pad - fold ABD to provide extra compression to wound Edema Control 3 Layer Compression System - Right Lower Extremity Avoid standing for long periods of time Elevate legs to the level of the heart or above for 30 minutes daily and/or when sitting, a frequency of: - throughout the day Exercise regularly Electronic Signature(s) Signed: 09/18/2019 6:01:32 PM By: Baltazar Najjarobson, Draven Laine MD Signed: 09/28/2019 4:32:31 PM By: Cherylin Mylarwiggins, Shannon Entered By: Cherylin Mylarwiggins, Shannon on 09/18/2019 12:11:41 -------------------------------------------------------------------------------- Problem List Details Patient Name: Date of Service: Lindsay Fritzathey, Luverne [name]. 09/18/2019 11:00 AM Medical Record [id number] Patient Account Number: [id number] Date of Birth/Sex: Treating RN: [birth year] (38 y.o. Lindsay LinkF) Lynch, Shatara Primary Care Provider: Gwinda PasseEdwards, Michelle Other Clinician: Referring Provider: Treating Provider/Extender:Abyan Cadman, Waymon BudgeMichael Edwards, Casey BurkittMichelle Weeks in Treatment: 9 Active Problems ICD-10 Evaluated Encounter Code Description Active Date Today Diagnosis I89.0 Lymphedema, not elsewhere classified 07/17/2019 No Yes L97.211 Non-pressure chronic ulcer of right calf limited to 07/17/2019 No Yes breakdown of skin E11.622 Type 2 diabetes mellitus with other skin ulcer 07/17/2019 No Yes Inactive Problems Resolved Problems Electronic Signature(s) Signed: 09/18/2019 6:01:32 PM By: Baltazar Najjarobson, Kollyn Lingafelter MD Entered By: Baltazar Najjarobson, Donnielle Addison on 09/18/2019 12:55:50 -------------------------------------------------------------------------------- Progress Note Details Patient Name: Date of Service: Lindsay Fritzathey, Meleni [name]. 09/18/2019 11:00 AM Medical Record [id number] Patient Account Number: [id number] Date of Birth/Sex: Treating RN: [birth year] (38 y.o. Lindsay LinkF) Lynch,  Shatara Primary Care Provider: Other Clinician: Gwinda PasseEdwards, Michelle Referring Provider: Treating Provider/Extender:Karanvir Balderston, Waymon BudgeMichael Edwards, Casey BurkittMichelle Weeks in Treatment: 9 Subjective History of Present Illness (HPI) ADMISSION 02/20/2019 This is a 38 year old woman with type 2 diabetes and peripheral neuropathy. She is here for review of a wound on her right medial leg for 6 weeks. She states this began in late January when she noticed an abscess on her right leg. She was seen in the ER. A culture showed a few group B strep. She had been treated with Bactrim and Keflex for 10 days as well as mupirocin. An x-ray was negative. The patient states she feels this was an abscess of a raised fluid filled area that got larger and burst on its own. She tells me she has had a history of abscesses the worst of which was in the right shoulder region that became associated with septic arthritis. She has been applying bacitracin to the wound. She followed up on 2/3 in the ER. Her Bactrim was extended. By this point a picture shows a necrotic looking surface. Past medical history; type 2 diabetes with a history of peripheral neuropathy, hypertension, PAD morbid obesity, coronary artery disease status post PCI and drug-eluting stent, pressure ulcer, hypertension, septic arthritis of the left shoulder, hepatitis B positive ABIs in our clinic were noncompressible on the right 3/9; not much change in the area on the right medial calf. We use silver alginate under 3 layer compression. Much better edema control. 3/16-Apparently slight change in the area on the right medial calf with perhaps minimal improvement, silver alginate and a 3 layer compression for 1 week was done. Edema control appears to be adequate. We will continue with the Hydrofer blue and 3 layer compression. ABIs were reviewed. Patient denies much pain over the week and in fact has been fairly comfortable under the 3 layer.Patient states that blood  sugar control has been reasonably improved with a fasting blood sugar ranging from 90-130. 3/23; patient continues to do nicely right medial calf. The wound is epithelializing. We have been using Hydrofera Blue under 3 layer compression. She

## 2019-09-29 ENCOUNTER — Other Ambulatory Visit: Payer: Self-pay | Admitting: Endocrinology

## 2019-09-29 ENCOUNTER — Other Ambulatory Visit: Payer: Self-pay | Admitting: Internal Medicine

## 2019-09-29 DIAGNOSIS — Z7689 Persons encountering health services in other specified circumstances: Secondary | ICD-10-CM | POA: Diagnosis not present

## 2019-09-29 DIAGNOSIS — G4733 Obstructive sleep apnea (adult) (pediatric): Secondary | ICD-10-CM | POA: Diagnosis not present

## 2019-10-02 ENCOUNTER — Other Ambulatory Visit: Payer: Self-pay

## 2019-10-02 ENCOUNTER — Encounter (HOSPITAL_BASED_OUTPATIENT_CLINIC_OR_DEPARTMENT_OTHER): Payer: Medicaid Other | Attending: Internal Medicine | Admitting: Internal Medicine

## 2019-10-02 DIAGNOSIS — S81801A Unspecified open wound, right lower leg, initial encounter: Secondary | ICD-10-CM | POA: Diagnosis not present

## 2019-10-02 DIAGNOSIS — I890 Lymphedema, not elsewhere classified: Secondary | ICD-10-CM | POA: Insufficient documentation

## 2019-10-02 DIAGNOSIS — Z7689 Persons encountering health services in other specified circumstances: Secondary | ICD-10-CM | POA: Diagnosis not present

## 2019-10-02 DIAGNOSIS — I509 Heart failure, unspecified: Secondary | ICD-10-CM | POA: Insufficient documentation

## 2019-10-02 DIAGNOSIS — L97812 Non-pressure chronic ulcer of other part of right lower leg with fat layer exposed: Secondary | ICD-10-CM | POA: Diagnosis not present

## 2019-10-02 DIAGNOSIS — E11622 Type 2 diabetes mellitus with other skin ulcer: Secondary | ICD-10-CM | POA: Insufficient documentation

## 2019-10-02 DIAGNOSIS — E1140 Type 2 diabetes mellitus with diabetic neuropathy, unspecified: Secondary | ICD-10-CM | POA: Diagnosis not present

## 2019-10-02 DIAGNOSIS — I110 Hypertensive heart disease with heart failure: Secondary | ICD-10-CM | POA: Insufficient documentation

## 2019-10-02 DIAGNOSIS — I252 Old myocardial infarction: Secondary | ICD-10-CM | POA: Insufficient documentation

## 2019-10-02 DIAGNOSIS — I2510 Atherosclerotic heart disease of native coronary artery without angina pectoris: Secondary | ICD-10-CM | POA: Insufficient documentation

## 2019-10-02 NOTE — Progress Notes (Signed)
Phone Orders: No Diagnosis Coding Discharge From [facility] Discharge from [facility]aden lotion - both legs daily Edema Control Patient to wear own compression stockings Avoid standing for long periods of time Elevate legs to the level of the heart or above for 30 minutes daily and/or when sitting, a frequency of: - throughout the day Exercise regularly Electronic Signature(s) Signed: 10/02/2019 5:18:03 PM By: Kela Millin Signed: 10/02/2019 5:50:55 PM By: Linton Ham MD Entered By: Kela Millin on 10/02/2019 09:44:01 -------------------------------------------------------------------------------- Problem List Details Patient Name: Date of Service: Lindsay Heater D. 10/02/2019 9:00 AM Medical Record [id number] Patient Account Number: [id number] Date of Birth/Sex: Treating RN: July 24, 1981 (38 y.o. Clearnce Sorrel Primary Care Provider: Juluis Mire Other Clinician: Referring Provider: Treating Provider/Extender:Kevontae Burgoon, Melanie Crazier, Zenaida Niece in Treatment: 11 Active Problems ICD-10 Evaluated Encounter Code Description Active Date Today Diagnosis I89.0 Lymphedema, not elsewhere classified 07/17/2019 No Yes L97.211 Non-pressure chronic ulcer of right calf limited to 07/17/2019 No Yes breakdown of skin E11.622 Type 2 diabetes mellitus with other skin ulcer 07/17/2019 No Yes Inactive Problems Resolved Problems Electronic Signature(s) Signed: 10/02/2019 5:50:55 PM By: Linton Ham MD Entered By: Linton Ham on 10/02/2019 10:17:41 -------------------------------------------------------------------------------- Progress Note Details Patient Name: Date of Service: Lindsay Heater D. 10/02/2019 9:00 AM Medical Record [id number] Patient Account Number: [id number] Date of Birth/Sex: Treating RN: 28-Oct-1981 (39 y.o. Lindsay [name] Primary Care Provider: Juluis Mire Other Clinician: Referring Provider: Treating Provider/Extender:Trinda Harlacher, Melanie Crazier,  Zenaida Niece in Treatment: 11 Subjective History of Present Illness (HPI) ADMISSION 02/20/2019 This is a 38 year old woman with type 2 diabetes and peripheral neuropathy. She is here for review of a wound on her right medial leg for 6 weeks. She states this began in late January when she noticed an abscess on her right leg. She was seen in the ER. A culture showed a few group B strep. She had been treated with Bactrim and Keflex for 10 days as well as mupirocin. An x-ray was negative. The patient states she feels this was an abscess of a raised fluid filled area that got larger and burst on its own. She tells me she has had a history of abscesses the worst of which was in the right shoulder region that became associated with septic arthritis. She has been applying bacitracin to the wound. She followed up on 2/3 in the ER. Her Bactrim was extended. By this point a picture shows a necrotic looking surface. Past medical history; type 2 diabetes with a history of peripheral neuropathy, hypertension, PAD morbid obesity, coronary artery disease status post PCI and drug-eluting stent, pressure ulcer, hypertension, septic arthritis of the left shoulder, hepatitis B positive ABIs in our clinic were noncompressible on the right 3/9; not much change in the area on the right medial calf. We use silver alginate under 3 layer compression. Much better edema control. 3/16-Apparently slight change in the area on the right medial calf with perhaps minimal improvement, silver alginate and a 3 layer compression for 1 week was done. Edema control appears to be adequate. We will continue with the Hydrofer blue and 3 layer compression. ABIs were reviewed. Patient denies much pain over the week and in fact has been fairly comfortable under the 3 layer.Patient states that blood sugar control has been reasonably improved with a fasting blood sugar ranging from 90-130. 3/23; patient continues to do nicely right medial  calf. The wound is epithelializing. We have been using Hydrofera Blue under 3 layer compression. She  ANJELICA, [name] ([id number]) Visit Report for 10/02/2019 HPI Details Patient Name: Date of Service: Lindsay, [name] 10/02/2019 9:00 AM Medical Record [id number] Patient Account Number: [id number] Date of Birth/Sex: Treating RN: 06-09-1981 (38 y.o. [name] Primary Care Provider: Gwinda Passe Other Clinician: Referring Provider: Treating Provider/Extender:Marvena Tally, Waymon Budge, Casey Burkitt in Treatment: 11 History of Present Illness HPI Description: ADMISSION 02/20/2019 This is a 38 year old woman with type 2 diabetes and peripheral neuropathy. She is here for review of a wound on her right medial leg for 6 weeks. She states this began in late January when she noticed an abscess on her right leg. She was seen in the ER. A culture showed a few group B strep. She had been treated with Bactrim and Keflex for 10 days as well as mupirocin. An x-ray was negative. The patient states she feels this was an abscess of a raised fluid filled area that got larger and burst on its own. She tells me she has had a history of abscesses the worst of which was in the right shoulder region that became associated with septic arthritis. She has been applying bacitracin to the wound. She followed up on 2/3 in the ER. Her Bactrim was extended. By this point a picture shows a necrotic looking surface. Past medical history; type 2 diabetes with a history of peripheral neuropathy, hypertension, PAD morbid obesity, coronary artery disease status post PCI and drug-eluting stent, pressure ulcer, hypertension, septic arthritis of the left shoulder, hepatitis B positive ABIs in our clinic were noncompressible on the right 3/9; not much change in the area on the right medial calf. We use silver alginate under 3 layer compression. Much better edema control. 3/16-Apparently slight change in the area on the right medial calf with perhaps minimal improvement, silver alginate and a 3 layer  compression for 1 week was done. Edema control appears to be adequate. We will continue with the Hydrofer blue and 3 layer compression. ABIs were reviewed. Patient denies much pain over the week and in fact has been fairly comfortable under the 3 layer.Patient states that blood sugar control has been reasonably improved with a fasting blood sugar ranging from 90-130. 3/23; patient continues to do nicely right medial calf. The wound is epithelializing. We have been using Hydrofera Blue under 3 layer compression. She does not report pain or excessive drainage. She is going to need graded pressure stockings and I began the discussion here. 3/30; right medial calf wound continues to make good progress. We have been using Hydrofera Blue under 3 layer compression 4/6; right medial calf wound continues to make good progress this was initially secondary to an abscess that required an IandD. 4/13; right medial calf. About 50% epithelialized. Using Hydrofera Blue under compression 4/20; right medial calf measuring smaller. We have using Hydrofera Blue under compression. Initially an abscess that required IandD 4/27; right medial calf. Comes in with eschar over a wide amount of the surface of what is left of this wound we have been using Hydrofera Blue under compression 5/4; right medial calf. The wound is totally epithelialized and healed. Initially an abscess READMISSION 07/17/19 Patient returns again with a wound on the same spot in the right medial calf. She states that this is been there for about a week and a half. She noted swelling in her leg prior to that and she could not get her stocking on. I am not completely certain what type of stockings she has. She has been applying peroxide and  Phone Orders: No Diagnosis Coding Discharge From [facility] Discharge from [place] lotion - both legs daily Edema Control Patient to wear own compression stockings Avoid standing for long periods of time Elevate legs to the level of the heart or above for 30 minutes daily and/or when sitting, a frequency of: - throughout the day Exercise regularly Electronic Signature(s) Signed: 10/02/2019 5:18:03 PM By: Kela Millin Signed: 10/02/2019 5:50:55 PM By: Linton Ham MD Entered By: Kela Millin on 10/02/2019 09:44:01 -------------------------------------------------------------------------------- Problem List Details Patient Name: Date of Service: Lindsay Heater D. 10/02/2019 9:00 AM Medical Record [id number] Patient Account Number: [id number] Date of Birth/Sex: Treating RN: July 24, 1981 (38 y.o. Clearnce Sorrel Primary Care Provider: Juluis Mire Other Clinician: Referring Provider: Treating Provider/Extender:Kevontae Burgoon, Melanie Crazier, Zenaida Niece in Treatment: 11 Active Problems ICD-10 Evaluated Encounter Code Description Active Date Today Diagnosis I89.0 Lymphedema, not elsewhere classified 07/17/2019 No Yes L97.211 Non-pressure chronic ulcer of right calf limited to 07/17/2019 No Yes breakdown of skin E11.622 Type 2 diabetes mellitus with other skin ulcer 07/17/2019 No Yes Inactive Problems Resolved Problems Electronic Signature(s) Signed: 10/02/2019 5:50:55 PM By: Linton Ham MD Entered By: Linton Ham on 10/02/2019 10:17:41 -------------------------------------------------------------------------------- Progress Note Details Patient Name: Date of Service: Lindsay Heater D. 10/02/2019 9:00 AM Medical Record [id number] Patient Account Number: [id number] Date of Birth/Sex: Treating RN: 28-Oct-1981 (39 y.o. Lindsay [name] Primary Care Provider: Juluis Mire Other Clinician: Referring Provider: Treating Provider/Extender:Trinda Harlacher, Melanie Crazier,  Zenaida Niece in Treatment: 11 Subjective History of Present Illness (HPI) ADMISSION 02/20/2019 This is a 38 year old woman with type 2 diabetes and peripheral neuropathy. She is here for review of a wound on her right medial leg for 6 weeks. She states this began in late January when she noticed an abscess on her right leg. She was seen in the ER. A culture showed a few group B strep. She had been treated with Bactrim and Keflex for 10 days as well as mupirocin. An x-ray was negative. The patient states she feels this was an abscess of a raised fluid filled area that got larger and burst on its own. She tells me she has had a history of abscesses the worst of which was in the right shoulder region that became associated with septic arthritis. She has been applying bacitracin to the wound. She followed up on 2/3 in the ER. Her Bactrim was extended. By this point a picture shows a necrotic looking surface. Past medical history; type 2 diabetes with a history of peripheral neuropathy, hypertension, PAD morbid obesity, coronary artery disease status post PCI and drug-eluting stent, pressure ulcer, hypertension, septic arthritis of the left shoulder, hepatitis B positive ABIs in our clinic were noncompressible on the right 3/9; not much change in the area on the right medial calf. We use silver alginate under 3 layer compression. Much better edema control. 3/16-Apparently slight change in the area on the right medial calf with perhaps minimal improvement, silver alginate and a 3 layer compression for 1 week was done. Edema control appears to be adequate. We will continue with the Hydrofer blue and 3 layer compression. ABIs were reviewed. Patient denies much pain over the week and in fact has been fairly comfortable under the 3 layer.Patient states that blood sugar control has been reasonably improved with a fasting blood sugar ranging from 90-130. 3/23; patient continues to do nicely right medial  calf. The wound is epithelializing. We have been using Hydrofera Blue under 3 layer compression. She  ANJELICA, [name] ([id number]) Visit Report for 10/02/2019 HPI Details Patient Name: Date of Service: Lindsay, [name] 10/02/2019 9:00 AM Medical Record [id number] Patient Account Number: [id number] Date of Birth/Sex: Treating RN: 06-09-1981 (38 y.o. [name] Primary Care Provider: Gwinda Passe Other Clinician: Referring Provider: Treating Provider/Extender:Marvena Tally, Waymon Budge, Casey Burkitt in Treatment: 11 History of Present Illness HPI Description: ADMISSION 02/20/2019 This is a 38 year old woman with type 2 diabetes and peripheral neuropathy. She is here for review of a wound on her right medial leg for 6 weeks. She states this began in late January when she noticed an abscess on her right leg. She was seen in the ER. A culture showed a few group B strep. She had been treated with Bactrim and Keflex for 10 days as well as mupirocin. An x-ray was negative. The patient states she feels this was an abscess of a raised fluid filled area that got larger and burst on its own. She tells me she has had a history of abscesses the worst of which was in the right shoulder region that became associated with septic arthritis. She has been applying bacitracin to the wound. She followed up on 2/3 in the ER. Her Bactrim was extended. By this point a picture shows a necrotic looking surface. Past medical history; type 2 diabetes with a history of peripheral neuropathy, hypertension, PAD morbid obesity, coronary artery disease status post PCI and drug-eluting stent, pressure ulcer, hypertension, septic arthritis of the left shoulder, hepatitis B positive ABIs in our clinic were noncompressible on the right 3/9; not much change in the area on the right medial calf. We use silver alginate under 3 layer compression. Much better edema control. 3/16-Apparently slight change in the area on the right medial calf with perhaps minimal improvement, silver alginate and a 3 layer  compression for 1 week was done. Edema control appears to be adequate. We will continue with the Hydrofer blue and 3 layer compression. ABIs were reviewed. Patient denies much pain over the week and in fact has been fairly comfortable under the 3 layer.Patient states that blood sugar control has been reasonably improved with a fasting blood sugar ranging from 90-130. 3/23; patient continues to do nicely right medial calf. The wound is epithelializing. We have been using Hydrofera Blue under 3 layer compression. She does not report pain or excessive drainage. She is going to need graded pressure stockings and I began the discussion here. 3/30; right medial calf wound continues to make good progress. We have been using Hydrofera Blue under 3 layer compression 4/6; right medial calf wound continues to make good progress this was initially secondary to an abscess that required an IandD. 4/13; right medial calf. About 50% epithelialized. Using Hydrofera Blue under compression 4/20; right medial calf measuring smaller. We have using Hydrofera Blue under compression. Initially an abscess that required IandD 4/27; right medial calf. Comes in with eschar over a wide amount of the surface of what is left of this wound we have been using Hydrofera Blue under compression 5/4; right medial calf. The wound is totally epithelialized and healed. Initially an abscess READMISSION 07/17/19 Patient returns again with a wound on the same spot in the right medial calf. She states that this is been there for about a week and a half. She noted swelling in her leg prior to that and she could not get her stocking on. I am not completely certain what type of stockings she has. She has been applying peroxide and  Phone Orders: No Diagnosis Coding Discharge From [facility] Discharge from [facility] lotion - both legs daily Edema Control Patient to wear own compression stockings Avoid standing for long periods of time Elevate legs to the level of the heart or above for 30 minutes daily and/or when sitting, a frequency of: - throughout the day Exercise regularly Electronic Signature(s) Signed: 10/02/2019 5:18:03 PM By: Kela Millin Signed: 10/02/2019 5:50:55 PM By: Linton Ham MD Entered By: Kela Millin on 10/02/2019 09:44:01 -------------------------------------------------------------------------------- Problem List Details Patient Name: Date of Service: Lindsay Heater D. 10/02/2019 9:00 AM Medical Record [id number] Patient Account Number: [id number] Date of Birth/Sex: Treating RN: July 24, 1981 (38 y.o. Clearnce Sorrel Primary Care Provider: Juluis Mire Other Clinician: Referring Provider: Treating Provider/Extender:Kevontae Burgoon, Melanie Crazier, Zenaida Niece in Treatment: 11 Active Problems ICD-10 Evaluated Encounter Code Description Active Date Today Diagnosis I89.0 Lymphedema, not elsewhere classified 07/17/2019 No Yes L97.211 Non-pressure chronic ulcer of right calf limited to 07/17/2019 No Yes breakdown of skin E11.622 Type 2 diabetes mellitus with other skin ulcer 07/17/2019 No Yes Inactive Problems Resolved Problems Electronic Signature(s) Signed: 10/02/2019 5:50:55 PM By: Linton Ham MD Entered By: Linton Ham on 10/02/2019 10:17:41 -------------------------------------------------------------------------------- Progress Note Details Patient Name: Date of Service: Lindsay Heater D. 10/02/2019 9:00 AM Medical Record [id number] Patient Account Number: [id number] Date of Birth/Sex: Treating RN: 28-Oct-1981 (39 y.o. Lindsay [name] Primary Care Provider: Juluis Mire Other Clinician: Referring Provider: Treating Provider/Extender:Trinda Harlacher, Melanie Crazier,  Zenaida Niece in Treatment: 11 Subjective History of Present Illness (HPI) ADMISSION 02/20/2019 This is a 38 year old woman with type 2 diabetes and peripheral neuropathy. She is here for review of a wound on her right medial leg for 6 weeks. She states this began in late January when she noticed an abscess on her right leg. She was seen in the ER. A culture showed a few group B strep. She had been treated with Bactrim and Keflex for 10 days as well as mupirocin. An x-ray was negative. The patient states she feels this was an abscess of a raised fluid filled area that got larger and burst on its own. She tells me she has had a history of abscesses the worst of which was in the right shoulder region that became associated with septic arthritis. She has been applying bacitracin to the wound. She followed up on 2/3 in the ER. Her Bactrim was extended. By this point a picture shows a necrotic looking surface. Past medical history; type 2 diabetes with a history of peripheral neuropathy, hypertension, PAD morbid obesity, coronary artery disease status post PCI and drug-eluting stent, pressure ulcer, hypertension, septic arthritis of the left shoulder, hepatitis B positive ABIs in our clinic were noncompressible on the right 3/9; not much change in the area on the right medial calf. We use silver alginate under 3 layer compression. Much better edema control. 3/16-Apparently slight change in the area on the right medial calf with perhaps minimal improvement, silver alginate and a 3 layer compression for 1 week was done. Edema control appears to be adequate. We will continue with the Hydrofer blue and 3 layer compression. ABIs were reviewed. Patient denies much pain over the week and in fact has been fairly comfortable under the 3 layer.Patient states that blood sugar control has been reasonably improved with a fasting blood sugar ranging from 90-130. 3/23; patient continues to do nicely right medial  calf. The wound is epithelializing. We have been using Hydrofera Blue under 3 layer compression. She

## 2019-10-04 ENCOUNTER — Telehealth (HOSPITAL_COMMUNITY): Payer: Self-pay

## 2019-10-04 NOTE — Telephone Encounter (Signed)

## 2019-10-05 ENCOUNTER — Ambulatory Visit (HOSPITAL_COMMUNITY)
Admission: RE | Admit: 2019-10-05 | Discharge: 2019-10-05 | Disposition: A | Discharge status: Home / Self Care | Payer: Medicaid Other | Source: Ambulatory Visit | Attending: Vascular Surgery | Admitting: Vascular Surgery

## 2019-10-05 ENCOUNTER — Encounter: Payer: Self-pay | Admitting: Vascular Surgery

## 2019-10-05 ENCOUNTER — Ambulatory Visit (INDEPENDENT_AMBULATORY_CARE_PROVIDER_SITE_OTHER): Payer: Medicaid Other | Admitting: Vascular Surgery

## 2019-10-05 ENCOUNTER — Other Ambulatory Visit: Payer: Self-pay

## 2019-10-05 VITALS — BP 130/86 | HR 83 | Temp 97.0°F | Resp 20 | Ht 67.5 in | Wt >= 6400 oz

## 2019-10-05 DIAGNOSIS — I872 Venous insufficiency (chronic) (peripheral): Secondary | ICD-10-CM | POA: Diagnosis not present

## 2019-10-05 DIAGNOSIS — L0390 Cellulitis, unspecified: Secondary | ICD-10-CM | POA: Insufficient documentation

## 2019-10-05 DIAGNOSIS — I739 Peripheral vascular disease, unspecified: Secondary | ICD-10-CM

## 2019-10-05 DIAGNOSIS — Z7689 Persons encountering health services in other specified circumstances: Secondary | ICD-10-CM | POA: Diagnosis not present

## 2019-10-05 NOTE — Progress Notes (Signed)
REASON FOR CONSULT:    "Venous reflux"  ASSESSMENT & PLAN:   BILATERAL LOWER EXTREMITY SWELLING: This patient has a healed ulcer on her medial right calf.  Based on her hyperpigmentation and leg swelling I suspect she does have some underlying chronic venous insufficiency although her venous reflux study today will was fairly unremarkable.  However because of her size I think there was some limitations to that study.  I think she does have some underlying peripheral vascular disease although she has a reasonable Doppler signal bilaterally.  I have discussed with her the importance of intermittent leg elevation and the proper positioning for this.  She will continue to wear her compression stockings.  I have encouraged her to avoid prolonged sitting and standing.  We discussed the importance of exercise and weight management.  If she develops recurrent ulceration and that I think she should have a formal arterial Doppler studies.  However currently the wound is healed and her swelling is under good control.  I will be happy to see her back at any time if any new vascular issues arise.   Deitra Mayo, MD, FACS Bee[facility] [phone] Office: [phone]   HPI:   Lindsay [name] is a pleasant 38 y.o. female, who was referred with bilateral lower extremity swelling.  She was sent by the wound care center as she has had a small wound on her right leg.  I have reviewed the records from the referring office.  The patient was seen by the wound care center on 08/21/2019.  She does have type 2 diabetes and a history of peripheral neuropathy.  She is had a wound on her medial right leg since July.  This actually began in January when she noted an abscess on her right leg.  She does have a history of previous infections and abscesses.  She states that at one point she had a septic arthritis in her right shoulder.  On my history the patient developed a small abscess in her medial right calf in January.  This  ultimately became ulcer.  She is been treated at the wound care center since July and was having compression therapy and aggressive wound care.  Now the wound has completely healed.  She is unaware of any previous history of DVT.  She does describe some symptoms consistent with claudication in the left calf.  The symptoms are brought on by ambulation and relieved with rest.  I do not get any clear-cut history of rest pain or nonhealing ulcers in her feet.  Her risk factors for peripheral vascular disease include diabetes, hypertension, hypercholesterolemia, and a family history of premature cardiovascular disease.  She is not a smoker.  She has a history of coronary artery disease and had an myocardial infarction in 2017.  Her cardiologist is Dr. Fredrik Cove.  Past Medical History:  Diagnosis Date   ARF (acute renal failure) ([place]) 04/2015   Asthma    Cellulitis of right upper extremity    Coronary artery disease    Diabetes mellitus    insulin dependent   Hyperlipidemia LDL goal <70    Hypertension    NSTEMI (non-ST elevated myocardial infarction) ([place]) 08/2016   Obesity    S/P angioplasty with stent 08/2016   DES to mLAD and PTCA only to 2nd diag ostium.     Family History  Problem Relation Age of Onset   Diabetes Mother    Hypertension Mother    Heart disease Mother    Diabetes Father  Heart disease Father    Stroke Maternal Grandmother    Cancer Maternal Grandmother     SOCIAL HISTORY: Social History   Socioeconomic History   Marital status: Single    Spouse name: Not on file   Number of children: Not on file   Years of education: Not on file   Highest education level: Not on file  Occupational History   Not on file  Social Needs   Financial resource strain: Not on file   Food insecurity    Worry: Not on file    Inability: Not on file   Transportation needs    Medical: Not on file    Non-medical: Not on file  Tobacco Use   Smoking  status: Never Smoker   Smokeless tobacco: Never Used  Substance and Sexual Activity   Alcohol use: Yes    Comment: socially   Drug use: No   Sexual activity: Not on file  Lifestyle   Physical activity    Days per week: Not on file    Minutes per session: Not on file   Stress: Not on file  Relationships   Social connections    Talks on phone: Not on file    Gets together: Not on file    Attends religious service: Not on file    Active member of club or organization: Not on file    Attends meetings of clubs or organizations: Not on file    Relationship status: Not on file   Intimate partner violence    Fear of current or ex partner: Not on file    Emotionally abused: Not on file    Physically abused: Not on file    Forced sexual activity: Not on file  Other Topics Concern   Not on file  Social History Narrative   Not on file    Allergies  Allergen Reactions   Hydrazine Yellow [Tartrazine] Shortness Of Breath and Swelling    Swelling mostly noticed in legs and feet, retaining urination, shortness of breaht, and minor chest pain   Lisinopril Shortness Of Breath    Was on prinzide; had sob/chest pain on it.   Tylenol [Acetaminophen] Itching and Swelling    Itching of the mouth, swelling of tongue and stomach started hurting    Current Outpatient Medications  Medication Sig Dispense Refill   Accu-Chek FastClix Lancets MISC USE 1 TO CHECK GLUCOSE 4 TIMES DAILY 102 each 0   albuterol (PROVENTIL HFA) 108 (90 Base) MCG/ACT inhaler INHALE 2 PUFFS BY MOUTH EVERY 6 HOURS AS NEEDED FOR WHEEZING OR SHORTNESS OF BREATH 1 each 2   amLODipine (NORVASC) 10 MG tablet Take 1 tablet (10 mg total) by mouth daily. 90 tablet 0   aspirin 81 MG tablet Take 1 tablet (81 mg total) by mouth daily. 30 tablet 11   aspirin-acetaminophen-caffeine (EXCEDRIN MIGRAINE) 250-250-65 MG tablet Take 2 tablets by mouth every 6 (six) hours as needed for headache.     atorvastatin (LIPITOR) 80  MG tablet Take 1 tablet (80 mg total) by mouth daily at 6 PM. Please call and schedule November/December follow up. [phone] 30 tablet 11   b complex vitamins tablet Take 1 tablet by mouth daily.     baclofen (LIORESAL) 10 MG tablet TAKE 1/2 TO 1 (ONE-HALF TO ONE) TABLET BY MOUTH THREE TIMES DAILY AS NEEDED FOR MUSCLE SPASM 30 tablet 0   BD VEO INSULIN SYRINGE U/F 31G X 15/64" 1 ML MISC USE  SYRINGE ONCE DAILY 100 each  1   Blood Glucose Monitoring Suppl (ACCU-CHEK AVIVA PLUS) w/Device KIT 1 each by Does not apply route as directed. 1 kit 0   DULoxetine (CYMBALTA) 30 MG capsule TAKE 1 CAPSULE BY MOUTH  DAILY 90 capsule 1   empagliflozin (JARDIANCE) 10 MG TABS tablet Take 10 mg by mouth daily. 30 tablet 3   ezetimibe (ZETIA) 10 MG tablet Take 1 tablet (10 mg total) by mouth daily. 90 tablet 3   gabapentin (NEURONTIN) 300 MG capsule TAKE 2 CAPSULES BY MOUTH THREE TIMES DAILY 180 capsule 0   glucose blood (ACCU-CHEK AVIVA PLUS) test strip Use as instructed to test blood sugar 4 times daily. 100 each 12   hydrochlorothiazide (HYDRODIURIL) 25 MG tablet Take 1 tablet (25 mg total) by mouth every morning. 30 tablet 3   hydrocortisone 2.5 % ointment Apply topically for rash on face up to twice daily as needed.     ibuprofen (ADVIL,MOTRIN) 200 MG tablet Take 400 mg by mouth every 6 (six) hours as needed for fever or moderate pain.     Insulin Pen Needle (B-D UF III MINI PEN NEEDLES) 31G X 5 MM MISC Use as instructed. Monitor blood glucose levels three times per day 90 each 1   insulin regular human CONCENTRATED (HUMULIN R) 500 UNIT/ML injection INJECT 70 UNITS 30 MINUTES BEFORE EACH MEAL 20 mL 3   ketoconazole (NIZORAL) 2 % shampoo Use as a shampoo at least once weekly.     liraglutide (VICTOZA) 18 MG/3ML SOPN Inject 0.24m daily x1 week then 1.269mdaily x1 week then 1.45m3maily and continue     losartan (COZAAR) 100 MG tablet Take 1 tablet (100 mg total) by mouth daily. 90 tablet 3    Melatonin 10 MG TABS Take 3 tablets by mouth at bedtime. 30 tablet 2   methocarbamol (ROBAXIN) 750 MG tablet Take 1 tablet (750 mg total) by mouth at bedtime. 30 tablet 3   metoprolol tartrate (LOPRESSOR) 50 MG tablet Take 1 tablet (50 mg total) by mouth 2 (two) times daily. 60 tablet 11   Multiple Vitamins-Minerals (MULTIVITAMIN PO) Take 1 tablet by mouth daily.     mupirocin ointment (BACTROBAN) 2 % Apply twice daily to the affected area x 7 days or until healed 22 g 1   nitroGLYCERIN (NITROSTAT) 0.4 MG SL tablet Place 1 tablet (0.4 mg total) under the tongue every 5 (five) minutes as needed for chest pain. 25 tablet 11   Insulin Glargine (LANTUS SOLOSTAR) 100 UNIT/ML Solostar Pen Inject 50 Units into the skin at bedtime. 45 mL 2   No current facility-administered medications for this visit.     REVIEW OF SYSTEMS:  [X]  denotes positive finding, [ ]  denotes negative finding Cardiac  Comments:  Chest pain or chest pressure:    Shortness of breath upon exertion: x   Short of breath when lying flat: x   Irregular heart rhythm:        Vascular    Pain in calf, thigh, or hip brought on by ambulation: x   Pain in feet at night that wakes you up from your sleep:  x   Blood clot in your veins:    Leg swelling:  x       Pulmonary    Oxygen at home:    Productive cough:     Wheezing:  x       Neurologic    Sudden weakness in arms or legs:     Sudden numbness in arms  or legs:     Sudden onset of difficulty speaking or slurred speech:    Temporary loss of vision in one eye:     Problems with dizziness:         Gastrointestinal    Blood in stool:     Vomited blood:         Genitourinary    Burning when urinating:     Blood in urine:        Psychiatric    Major depression:         Hematologic    Bleeding problems:    Problems with blood clotting too easily:        Skin    Rashes or ulcers:        Constitutional    Fever or chills:     PHYSICAL EXAM:   Vitals:    10/05/19 1447  BP: 130/86  Pulse: 83  Resp: 20  Temp: (!) 97 F (36.1 C)  TempSrc: Temporal  SpO2: 97%  Weight: (!) 416 lb 12.8 oz (189.1 kg)  Height: 5' 7.5" (1.715 m)    GENERAL: The patient is a well-nourished female, in no acute distress. The vital signs are documented above. CARDIAC: There is a regular rate and rhythm.  VASCULAR: I do not detect carotid bruits. Because of her size I am unable to palpate femoral, popliteal, or pedal pulses. I did assess with the Doppler and again the study was somewhat limited because of her swelling however I was able to obtain a risk anterior tibial on the right and a brisk dorsalis pedis signal on the left. She has moderate bilateral lower extremity swelling.  She does have hyperpigmentation bilaterally consistent with chronic venous insufficiency. She has a healed wound on her medial right calf which is approximately 2 cm in diameter.    PULMONARY: There is good air exchange bilaterally without wheezing or rales. ABDOMEN: Soft and non-tender with normal pitched bowel sounds.  MUSCULOSKELETAL: There are no major deformities or cyanosis. NEUROLOGIC: No focal weakness or paresthesias are detected. SKIN: There are no ulcers or rashes noted. PSYCHIATRIC: The patient has a normal affect.  DATA:    VENOUS DUPLEX: I have independently interpreted her venous duplex scan today.  On the right side there is no evidence of DVT or superficial venous thrombosis.  There is no deep venous reflux noted on the right.  There is no superficial venous reflux noted on the right.  On the left side there is no evidence of DVT or superficial venous thrombosis.  There is no deep venous reflux or superficial venous reflux on the left.  Her labs in February of this year showed a creatinine of 0.81.

## 2019-10-18 ENCOUNTER — Ambulatory Visit: Payer: Medicaid Other | Admitting: Podiatry

## 2019-10-19 ENCOUNTER — Telehealth: Payer: Self-pay | Admitting: Family Medicine

## 2019-10-19 NOTE — Telephone Encounter (Signed)
Patient called stating that [facility] has not received the referral for the PT.  Their fax number is [phone].  CB#[phone].  Thank you.

## 2019-10-22 DIAGNOSIS — G4733 Obstructive sleep apnea (adult) (pediatric): Secondary | ICD-10-CM | POA: Diagnosis not present

## 2019-10-23 ENCOUNTER — Other Ambulatory Visit: Payer: Self-pay | Admitting: Family Medicine

## 2019-10-23 DIAGNOSIS — F54 Psychological and behavioral factors associated with disorders or diseases classified elsewhere: Secondary | ICD-10-CM | POA: Diagnosis not present

## 2019-10-23 DIAGNOSIS — Z7689 Persons encountering health services in other specified circumstances: Secondary | ICD-10-CM | POA: Diagnosis not present

## 2019-10-23 DIAGNOSIS — G4733 Obstructive sleep apnea (adult) (pediatric): Secondary | ICD-10-CM | POA: Diagnosis not present

## 2019-10-23 DIAGNOSIS — J45909 Unspecified asthma, uncomplicated: Secondary | ICD-10-CM

## 2019-10-23 NOTE — Telephone Encounter (Signed)
Would you mind refaxing this referral? It looks like the original referral was from July.

## 2019-10-24 NOTE — Telephone Encounter (Signed)
Order faxed.

## 2019-11-01 DIAGNOSIS — Z7689 Persons encountering health services in other specified circumstances: Secondary | ICD-10-CM | POA: Diagnosis not present

## 2019-11-06 ENCOUNTER — Other Ambulatory Visit (INDEPENDENT_AMBULATORY_CARE_PROVIDER_SITE_OTHER): Payer: Self-pay | Admitting: Primary Care

## 2019-11-06 DIAGNOSIS — E1165 Type 2 diabetes mellitus with hyperglycemia: Secondary | ICD-10-CM

## 2019-11-06 MED ORDER — JARDIANCE 10 MG PO TABS: 10.0000 mg | ORAL_TABLET | Freq: Every day | ORAL | 3 refills | Status: DC

## 2019-11-08 DIAGNOSIS — R7303 Prediabetes: Secondary | ICD-10-CM | POA: Diagnosis not present

## 2019-11-08 DIAGNOSIS — Z760 Encounter for issue of repeat prescription: Secondary | ICD-10-CM | POA: Diagnosis not present

## 2019-11-08 DIAGNOSIS — Z6841 Body Mass Index (BMI) 40.0 and over, adult: Secondary | ICD-10-CM | POA: Diagnosis not present

## 2019-11-08 DIAGNOSIS — Z708 Other sex counseling: Secondary | ICD-10-CM | POA: Diagnosis not present

## 2019-11-08 DIAGNOSIS — Z794 Long term (current) use of insulin: Secondary | ICD-10-CM | POA: Diagnosis not present

## 2019-11-08 DIAGNOSIS — Z23 Encounter for immunization: Secondary | ICD-10-CM | POA: Diagnosis not present

## 2019-11-08 DIAGNOSIS — E1140 Type 2 diabetes mellitus with diabetic neuropathy, unspecified: Secondary | ICD-10-CM | POA: Diagnosis not present

## 2019-11-08 DIAGNOSIS — E1165 Type 2 diabetes mellitus with hyperglycemia: Secondary | ICD-10-CM | POA: Diagnosis not present

## 2019-11-08 DIAGNOSIS — D367 Benign neoplasm of other specified sites: Secondary | ICD-10-CM | POA: Diagnosis not present

## 2019-11-09 ENCOUNTER — Other Ambulatory Visit (INDEPENDENT_AMBULATORY_CARE_PROVIDER_SITE_OTHER): Payer: Self-pay | Admitting: Primary Care

## 2019-11-09 DIAGNOSIS — Z760 Encounter for issue of repeat prescription: Secondary | ICD-10-CM

## 2019-11-09 DIAGNOSIS — I10 Essential (primary) hypertension: Secondary | ICD-10-CM

## 2019-11-14 ENCOUNTER — Ambulatory Visit (INDEPENDENT_AMBULATORY_CARE_PROVIDER_SITE_OTHER): Payer: Medicaid Other | Admitting: Primary Care

## 2019-11-21 DIAGNOSIS — G4733 Obstructive sleep apnea (adult) (pediatric): Secondary | ICD-10-CM | POA: Diagnosis not present

## 2019-11-27 ENCOUNTER — Ambulatory Visit (INDEPENDENT_AMBULATORY_CARE_PROVIDER_SITE_OTHER): Payer: Medicaid Other | Admitting: Primary Care

## 2019-11-27 ENCOUNTER — Other Ambulatory Visit: Payer: Self-pay | Admitting: Family Medicine

## 2019-11-27 MED ORDER — BACLOFEN 10 MG PO TABS: 5.0000 mg | ORAL_TABLET | Freq: Three times a day (TID) | ORAL | 1 refills | Status: DC | PRN

## 2019-11-27 NOTE — Telephone Encounter (Signed)
This is a [facility] pt.  

## 2019-11-28 NOTE — Progress Notes (Signed)
Electronic Signature(s) Signed: 11/28/2019 3:07:45 PM By: Yevonne Pax RN Entered By: Yevonne Pax on 08/07/2019 10:30:47 -------------------------------------------------------------------------------- Patient/Caregiver Education Details Patient Name: Date of Service: [name] 8/17/2020andnbsp10:15 AM Medical Record [id number] Patient Account Number: [id number] Date of Birth/Gender: February 15, 1981 (37 y.o. F) Treating RN: Zandra Abts Primary Care Physician: Gwinda Passe Other Clinician: Referring Physician: Treating Physician/Extender:Robson, Waymon Budge, Casey Burkitt in Treatment: 3 Education Assessment Education Provided To: Patient Education Topics Provided Venous: Methods: Explain/Verbal Responses: State content correctly Wound/Skin Impairment: Methods: Explain/Verbal Responses: State content correctly Electronic Signature(s) Signed: 08/07/2019 6:10:13 PM By: Zandra Abts RN, BSN Entered By: Zandra Abts on 08/07/2019 10:57:40 -------------------------------------------------------------------------------- Wound Assessment Details Patient Name: Date of Service: [name] 08/07/2019 10:15 AM Medical Record [id number] Patient Account Number: [id number] Date of Birth/Sex: Treating RN: 07-22-1981 (37 y.o. Freddy Finner Primary Care Teandra Harlan: Gwinda Passe Other Clinician: Referring Alis Sawchuk: Treating Calum Cormier/Extender:Robson, Waymon Budge, Casey Burkitt in Treatment: 3 Wound Status Wound Number: 2 Primary Venous Leg Ulcer Etiology: Wound Location: Right Lower Leg - Medial Wound Open Wounding Event: Gradually Appeared Status: Date Acquired: 07/07/2019 Comorbid Lymphedema, Asthma, Congestive Heart Weeks Of Treatment: 3 History: Failure, Coronary Artery Disease, Clustered Wound: No Hypertension, Myocardial Infarction, Hepatitis B, Type II  Diabetes, Neuropathy Photos Wound Measurements Length: (cm) 1.6 % Reduct Width: (cm) 2.5 % Reduct Depth: (cm) 0.1 Epitheli Area: (cm) 3.142 Tunneli Volume: (cm) 0.314 Undermi Wound Description Full Thickness Without Exposed Support Foul Od Classification: Structures Slough/ Wound Flat and Intact Margin: Exudate Medium Amount: Exudate Serosanguineous Type: Exudate red, brown Color: Wound Bed Granulation Amount: Large (67-100%) Granulation Quality: Red, Pink Fascia Necrotic Amount: Small (1-33%) Fat Lay Necrotic Quality: Adherent Slough Tendon Muscle Joint E Bone Ex or After Cleansing: No Fibrino Yes Exposed Structure Exposed: No er (Subcutaneous Tissue) Exposed: Yes Exposed: No Exposed: No xposed: No posed: No ion in Area: 9.1% ion in Volume: 9.2% alization: Small (1-33%) ng: No ning: No Electronic Signature(s) Signed: 08/08/2019 1:57:29 PM By: Benjaman Kindler EMT/HBOT Signed: 11/28/2019 3:07:45 PM By: Yevonne Pax RN Entered By: Benjaman Kindler on 08/08/2019 13:52:45 -------------------------------------------------------------------------------- Vitals Details Patient Name: Date of Service: [name] 08/07/2019 10:15 AM Medical Record [id number] Patient Account Number: [id number] Date of Birth/Sex: Treating RN: 06/16/1981 (37 y.o. Freddy Finner Primary Care Adelbert Gaspard: Gwinda Passe Other Clinician: Referring Lilymae Swiech: Treating Chizara Mena/Extender:Robson, Waymon Budge, Casey Burkitt in Treatment: 3 Vital Signs Time Taken: 10:29 Temperature (F): 98.3 Height (in): 67 Pulse (bpm): 95 Weight (lbs): 413 Respiratory Rate (breaths/min): 18 Body Mass Index (BMI): 64.7 Blood Pressure (mmHg): 175/95 Reference Range: 80 - 120 mg / dl Notes pateitn reports taking medications for blood pressure . denies lightheadedness/ dizziness/ blurred vision/ headache/ ringing of ears. notified Dr Leanord Hawking Electronic Signature(s) Signed: 11/28/2019  3:07:45 PM By: Yevonne Pax RN Entered By: Yevonne Pax on 08/07/2019 10:30:37  ALANI, SABBAGH ([id number]) Visit Report for 08/07/2019 Arrival Information Details Patient Name: Date of Service: Phelicia, Dantes 08/07/2019 10:15 AM Medical Record [id number] Patient Account Number: [id number] Date of Birth/Sex: Treating RN: 02-22-1981 (38 y.o. Orvan Falconer Primary Care Bonnell Placzek: Juluis Mire Other Clinician: Referring Demia Viera: Treating Aleira Deiter/Extender:Robson, Melanie Crazier, Zenaida Niece in Treatment: 3 Visit Information History Since Last Visit All ordered tests and consults were completed: No Patient Arrived: Kasandra Knudsen Added or deleted any medications: No Arrival Time: 10:28 Any new allergies or adverse reactions: No Accompanied By: self Had a fall or experienced change in No Transfer Assistance: None activities of daily living that may affect Patient Identification Verified: Yes risk of falls: Secondary Verification Process Completed: Yes Signs or symptoms of abuse/neglect since last No visito Hospitalized since last visit: No Implantable device outside of the clinic excluding No cellular tissue based products placed in the center since last visit: Has Dressing in Place as Prescribed: Yes Has Compression in Place as Prescribed: Yes Pain Present Now: No Electronic Signature(s) Signed: 11/28/2019 3:07:45 PM By: Carlene Coria RN Entered By: Carlene Coria on 08/07/2019 10:29:11 -------------------------------------------------------------------------------- Compression Therapy Details Patient Name: Date of Service: Janita, Camberos 08/07/2019 10:15 AM Medical Record [id number] Patient Account Number: [id number] Date of Birth/Sex: Treating RN: March 26, 1981 (38 y.o. [name] Primary Care Cyrena Kuchenbecker: Juluis Mire Other Clinician: Referring Zyier Dykema: Treating Jeanean Hollett/Extender:Robson, Melanie Crazier, Zenaida Niece in Treatment: 3 Compression Therapy Performed for Wound Wound #2 Right,Medial Lower  Leg Assessment: Performed By: Clinician Levan Hurst, RN Compression Type: Three Layer Post Procedure Diagnosis Same as Pre-procedure Electronic Signature(s) Signed: 08/07/2019 6:10:13 PM By: Levan Hurst RN, BSN Entered By: Levan Hurst on 08/07/2019 10:58:01 -------------------------------------------------------------------------------- Encounter Discharge Information Details Patient Name: Date of Service: [name] 08/07/2019 10:15 AM Medical Record [id number] Patient Account Number: [id number] Date of Birth/Sex: Treating RN: June 09, 1981 (38 y.o. Debby Bud Primary Care Audray Rumore: Juluis Mire Other Clinician: Referring Lorana Maffeo: Treating Estelle Greenleaf/Extender:Robson, Melanie Crazier, Zenaida Niece in Treatment: 3 Encounter Discharge Information Items Discharge Condition: Stable Ambulatory Status: Cane Discharge Destination: Home Transportation: Private Auto Accompanied By: self Schedule Follow-up Appointment: Yes Clinical Summary of Care: Electronic Signature(s) Signed: 08/07/2019 6:04:22 PM By: Deon Pilling Entered By: Deon Pilling on 08/07/2019 12:39:39 -------------------------------------------------------------------------------- Lower Extremity Assessment Details Patient Name: Date of Service: Jozlin, Bently 08/07/2019 10:15 AM Medical Record [id number] Patient Account Number: [id number] Date of Birth/Sex: Treating RN: 1981-09-27 (38 y.o. Orvan Falconer Primary Care Special Ranes: Juluis Mire Other Clinician: Referring Molley Houser: Treating Sianni Cloninger/Extender:Robson, Melanie Crazier, Zenaida Niece in Treatment: 3 Edema Assessment Assessed: [Left: No] [Right: No] Edema: [Left: Ye] [Right: s] Calf Left: Right: Point of Measurement: 39 cm From Medial Instep cm 53 cm Ankle Left: Right: Point of Measurement: 12 cm From Medial Instep cm 26 cm Electronic Signature(s) Signed: 11/28/2019 3:07:45 PM By: Carlene Coria RN Entered  By: Carlene Coria on 08/07/2019 10:37:57 -------------------------------------------------------------------------------- Multi Wound Chart Details Patient Name: Date of Service: [name] 08/07/2019 10:15 AM Medical Record [id number] Patient Account Number: [id number] Date of Birth/Sex: Treating RN: [birth year] (38 y.o. [name] Primary Care Orra Nolde: Juluis Mire Other Clinician: Referring Antia Rahal: Treating Tedford Berg/Extender:Robson, Melanie Crazier, Zenaida Niece in Treatment: 3 Vital Signs Height(in): 67 Pulse(bpm): 95 Weight(lbs): 277 Blood Pressure(mmHg): 175/95 Body Mass Index(BMI): 65 Temperature(F): 98.3 Respiratory 18 Rate(breaths/min): Photos: [2:No Photos] [N/A:N/A] Wound Location: [2:Right Lower Leg - Medial N/A] Wounding Event: [2:Gradually Appeared] [N/A:N/A] Primary Etiology: [2:Venous Leg Ulcer] [N/A:N/A] Comorbid History: [2:Lymphedema, Asthma, Congestive Heart Failure, Coronary Artery Disease, Hypertension,  Myocardial Infarction, Hepatitis B, Type II Diabetes, Neuropathy] [N/A:N/A] Date Acquired: [2:07/07/2019] [N/A:N/A] Weeks of Treatment: [2:3] [N/A:N/A] Wound Status: [2:Open] [N/A:N/A] Measurements L x W x D 1.6x2.5x0.1 [N/A:N/A] (cm) Area (cm) : [2:3.142] [N/A:N/A] Volume (cm) : [2:0.314] [N/A:N/A] % Reduction in Area: [2:9.10%] [N/A:N/A] % Reduction in Volume: 9.20% [N/A:N/A] Classification: [2:Full Thickness Without Exposed Support Structures] [N/A:N/A] Exudate Amount: [2:Medium] [N/A:N/A] Exudate Type: [2:Serosanguineous] [N/A:N/A] Exudate Color: [2:red, brown] [N/A:N/A] Wound Margin: [2:Flat and Intact] [N/A:N/A] Granulation Amount: [2:Large (67-100%)] [N/A:N/A] Granulation Quality: [2:Red, Pink] [N/A:N/A] Necrotic Amount: [2:Small (1-33%)] [N/A:N/A] Exposed Structures: [2:Fat Layer (Subcutaneous Tissue) Exposed: Yes Fascia: No Tendon: No Muscle: No Joint: No Bone: No] [N/A:N/A] Epithelialization: [2:Small  (1-33%)] [N/A:N/A] Procedures Performed: [2:Chemical Cauterization Compression Therapy] [N/A:N/A] Treatment Notes Wound #2 (Right, Medial Lower Leg) 1. Cleanse With Wound Cleanser 2. Periwound Care Moisturizing lotion 3. Primary Dressing Applied Hydrofera Blue 4. Secondary Dressing ABD Pad Dry Gauze 6. Support Layer Applied 3 layer compression wrap Notes netting Electronic Signature(s) Signed: 08/07/2019 6:10:13 PM By: Zandra Abts RN, BSN Signed: 08/07/2019 6:32:44 PM By: Baltazar Najjar MD Entered By: Baltazar Najjar on 08/07/2019 12:41:46 -------------------------------------------------------------------------------- Multi-Disciplinary Care Plan Details Patient Name: Date of Service: [name] 08/07/2019 10:15 AM Medical Record [id number] Patient Account Number: [id number] Date of Birth/Sex: Treating RN: 1981/03/14 (37 y.o. [name] Primary Care Leyton Magoon: Gwinda Passe Other Clinician: Referring Rosamae Rocque: Treating Demarie Hyneman/Extender:Robson, Waymon Budge, Casey Burkitt in Treatment: 3 Active Inactive Nutrition Nursing Diagnoses: Impaired glucose control: actual or potential Potential for alteratiion in Nutrition/Potential for imbalanced nutrition Goals: Patient/caregiver agrees to and verbalizes understanding of need to use nutritional supplements and/or vitamins as prescribed Date Initiated: 07/17/2019 Target Resolution Date: 08/18/2019 Goal Status: Active Patient/caregiver will maintain therapeutic glucose control Date Initiated: 07/17/2019 Target Resolution Date: 08/18/2019 Goal Status: Active Interventions: Assess HgA1c results as ordered upon admission and as needed Assess patient nutrition upon admission and as needed per policy Provide education on elevated blood sugars and impact on wound healing Provide education on nutrition Notes: Venous Leg Ulcer Nursing Diagnoses: Actual venous Insuffiency (use after diagnosis is  confirmed) Knowledge deficit related to disease process and management Goals: Patient will maintain optimal edema control Date Initiated: 07/17/2019 Target Resolution Date: 08/18/2019 Goal Status: Active Patient/caregiver will verbalize understanding of disease process and disease management Date Initiated: 07/17/2019 Target Resolution Date: 08/18/2019 Goal Status: Active Interventions: Assess peripheral edema status every visit. Compression as ordered Provide education on venous insufficiency Notes: Wound/Skin Impairment Nursing Diagnoses: Impaired tissue integrity Knowledge deficit related to ulceration/compromised skin integrity Goals: Patient/caregiver will verbalize understanding of skin care regimen Date Initiated: 07/17/2019 Target Resolution Date: 08/18/2019 Goal Status: Active Ulcer/skin breakdown will have a volume reduction of 30% by week 4 Date Initiated: 07/17/2019 Target Resolution Date: 08/18/2019 Goal Status: Active Interventions: Assess patient/caregiver ability to obtain necessary supplies Assess patient/caregiver ability to perform ulcer/skin care regimen upon admission and as needed Assess ulceration(s) every visit Provide education on ulcer and skin care Notes: Electronic Signature(s) Signed: 08/07/2019 6:10:13 PM By: Zandra Abts RN, BSN Entered By: Zandra Abts on 08/07/2019 10:57:19 -------------------------------------------------------------------------------- Pain Assessment Details Patient Name: Date of Service: [name] 08/07/2019 10:15 AM Medical Record [id number] Patient Account Number: [id number] Date of Birth/Sex: Treating RN: 09/21/1981 (37 y.o. Freddy Finner Primary Care Ryu Cerreta: Gwinda Passe Other Clinician: Referring Kena Limon: Treating Travonte Byard/Extender:Robson, Waymon Budge, Casey Burkitt in Treatment: 3 Active Problems Location of Pain Severity and Description of Pain Patient Has Paino No Site  Locations Pain Management and Medication Current Pain Management:

## 2019-11-28 NOTE — Progress Notes (Signed)
Lindsay, [name] ([id number]) Visit Report for 09/04/2019 Arrival Information Details Patient Name: Date of Service: Lindsay [name], Lindsay [name] 09/04/2019 10:00 AM Medical Record [id number] Patient Account Number: [id number] Date of Birth/Sex: Treating RN: [birth year] (38 y.o. Helene Shoe, Tammi Klippel Primary Care Bruk Tumolo: Juluis Mire Other Clinician: Referring Danniell Rotundo: Treating Ellyssa Zagal/Extender:Robson, Melanie Crazier, Zenaida Niece in Treatment: 7 Visit Information History Since Last Visit All ordered tests and consults were completed: No Patient Arrived: Ambulatory Added or deleted any medications: No Arrival Time: 10:00 Any new allergies or adverse reactions: No Accompanied By: self Had a fall or experienced change in No Transfer Assistance: None activities of daily living that may affect Patient Identification Verified: Yes risk of falls: Secondary Verification Process Completed: Yes Signs or symptoms of abuse/neglect since last No Patient Requires Transmission-Based No visito Precautions: Hospitalized since last visit: No Patient Has Alerts: No Implantable device outside of the clinic excluding No cellular tissue based products placed in the center since last visit: Has Dressing in Place as Prescribed: Yes Pain Present Now: No Electronic Signature(s) Signed: 11/28/2019 3:04:22 PM By: Carlene Coria RN Entered By: Carlene Coria on 09/04/2019 10:44:16 -------------------------------------------------------------------------------- Compression Therapy Details Patient Name: Date of Service: Lindsay, [name] 09/04/2019 10:00 AM Medical Record [id number] Patient Account Number: [id number] Date of Birth/Sex: Treating RN: September 26, 1981 (38 y.o. Debby Bud Primary Care Lynnsey Barbara: Juluis Mire Other Clinician: Referring Kandice Schmelter: Treating Jes Costales/Extender:Robson, Melanie Crazier, Zenaida Niece in Treatment: 7 Compression Therapy Performed for Wound Wound  #2 Right,Medial Lower Leg Assessment: Performed By: Jake Church, RN Compression Type: Three Layer Post Procedure Diagnosis Same as Pre-procedure Electronic Signature(s) Signed: 09/04/2019 5:18:15 PM By: Deon Pilling Entered By: Deon Pilling on 09/04/2019 10:29:55 -------------------------------------------------------------------------------- Encounter Discharge Information Details Patient Name: Date of Service: Lindsay Heater D. 09/04/2019 10:00 AM Medical Record [id number] Patient Account Number: [id number] Date of Birth/Sex: Treating RN: 1981-07-26 (38 y.o. Orvan Falconer Primary Care Denese Mentink: Juluis Mire Other Clinician: Referring Britne Borelli: Treating Issa Luster/Extender:Robson, Melanie Crazier, Zenaida Niece in Treatment: 7 Encounter Discharge Information Items Discharge Condition: Stable Ambulatory Status: Wheelchair Discharge Destination: Home Transportation: Private Auto Accompanied By: self Schedule Follow-up Appointment: Yes Clinical Summary of Care: Patient Declined Electronic Signature(s) Signed: 11/28/2019 3:04:22 PM By: Carlene Coria RN Entered By: Carlene Coria on 09/04/2019 10:46:54 -------------------------------------------------------------------------------- Lower Extremity Assessment Details Patient Name: Date of Service: Lindsay, [name] 09/04/2019 10:00 AM Medical Record [id number] Patient Account Number: [id number] Date of Birth/Sex: Treating RN: [birth year] (38 y.o. Orvan Falconer Primary Care Osmar Howton: Juluis Mire Other Clinician: Referring Jovi Alvizo: Treating Brannon Decaire/Extender:Robson, Melanie Crazier, Zenaida Niece in Treatment: 7 Edema Assessment Assessed: [Left: No] [Right: No] Edema: [Left: Ye] [Right: s] Calf Left: Right: Point of Measurement: 39 cm From Medial Instep cm 51 cm Ankle Left: Right: Point of Measurement: 12 cm From Medial Instep cm 25.5 cm Electronic Signature(s) Signed: 11/28/2019 3:04:22  PM By: Carlene Coria RN Entered By: Carlene Coria on 09/04/2019 10:08:26 -------------------------------------------------------------------------------- Multi Wound Chart Details Patient Name: Date of Service: Lindsay Heater D. 09/04/2019 10:00 AM Medical Record [id number] Patient Account Number: [id number] Date of Birth/Sex: Treating RN: 08-16-1981 (38 y.o. Debby Bud Primary Care Tammera Engert: Juluis Mire Other Clinician: Referring Seraj Dunnam: Treating Karon Cotterill/Extender:Robson, Melanie Crazier, Zenaida Niece in Treatment: 7 Vital Signs Height(in): 67 Capillary Blood 144 Glucose(mg/dl): Weight(lbs): 413 Pulse(bpm): 37 Body Mass Index(BMI): 53 Blood Pressure(mmHg): 139/78 Temperature(F): 98.5 Respiratory 18 Rate(breaths/min): Photos: [2:No Photos] [N/A:N/A] Wound Location: [2:Right Lower Leg - Medial N/A] Wounding Event: [2:Gradually Appeared] [N/A:N/A] Primary Etiology: [2:Venous Leg Ulcer] [N/A:N/A] Comorbid History: [2:Lymphedema, Asthma,  Congestive Heart Failure, Coronary Artery Disease, Hypertension, Myocardial Infarction, Hepatitis B, Type II Diabetes, Neuropathy] [N/A:N/A] Date Acquired: [2:07/07/2019] [N/A:N/A] Weeks of Treatment: [2:7] [N/A:N/A] Wound Status: [2:Open] [N/A:N/A] Measurements L x W x D 1x1.1x0.1 [N/A:N/A] (cm) Area (cm) : [2:0.864] [N/A:N/A] Volume (cm) : [2:0.086] [N/A:N/A] % Reduction in Area: [2:75.00%] [N/A:N/A] % Reduction in Volume: 75.10% [N/A:N/A] Classification: [2:Full Thickness Without Exposed Support Structures] [N/A:N/A] Exudate Amount: [2:Medium] [N/A:N/A] Exudate Type: [2:Serosanguineous] [N/A:N/A] Exudate Color: [2:red, brown] [N/A:N/A] Wound Margin: [2:Flat and Intact] [N/A:N/A] Granulation Amount: [2:Large (67-100%)] [N/A:N/A] Granulation Quality: [2:Red, Pink] [N/A:N/A] Necrotic Amount: [2:Small (1-33%)] [N/A:N/A] Exposed Structures: [2:Fat Layer (Subcutaneous Tissue) Exposed: Yes Fascia: No Tendon: No Muscle:  No Joint: No Bone: No] [N/A:N/A] Epithelialization: [2:Small (1-33%) Compression Therapy] [N/A:N/A N/A] Treatment Notes Electronic Signature(s) Signed: 09/04/2019 5:18:15 PM By: Deon Pilling Signed: 09/05/2019 9:27:38 AM By: Linton Ham MD Entered By: Linton Ham on 09/04/2019 10:40:02 -------------------------------------------------------------------------------- Multi-Disciplinary Care Plan Details Patient Name: Date of Service: Lindsay Heater D. 09/04/2019 10:00 AM Medical Record [id number] Patient Account Number: [id number] Date of Birth/Sex: Treating RN: [birth year] (38 y.o. Helene Shoe, Tammi Klippel Primary Care Yatziri Wainwright: Juluis Mire Other Clinician: Referring Alesandro Stueve: Treating Ilamae Geng/Extender:Robson, Melanie Crazier, Zenaida Niece in Treatment: 7 Active Inactive Wound/Skin Impairment Nursing Diagnoses: Impaired tissue integrity Knowledge deficit related to ulceration/compromised skin integrity Goals: Patient/caregiver will verbalize understanding of skin care regimen Date Initiated: 07/17/2019 Target Resolution Date: 09/15/2019 Goal Status: Active Ulcer/skin breakdown will have a volume reduction of 30% by week 4 Date Initiated: 07/17/2019 Date Inactivated: 08/14/2019 Target Resolution Date: 08/18/2019 Goal Status: Met Ulcer/skin breakdown will have a volume reduction of 50% by week 8 Date Initiated: 08/14/2019 Target Resolution Date: 09/15/2019 Goal Status: Active Interventions: Assess patient/caregiver ability to obtain necessary supplies Assess patient/caregiver ability to perform ulcer/skin care regimen upon admission and as needed Assess ulceration(s) every visit Provide education on ulcer and skin care Notes: Electronic Signature(s) Signed: 09/04/2019 5:18:15 PM By: Deon Pilling Entered By: Deon Pilling on 09/04/2019 10:26:23 -------------------------------------------------------------------------------- Pain Assessment Details Patient  Name: Date of Service: Lindsay Heater D. 09/04/2019 10:00 AM Medical Record [id number] Patient Account Number: [id number] Date of Birth/Sex: Treating RN: Sep 26, 1981 (38 y.o. Debby Bud Primary Care Callahan Peddie: Juluis Mire Other Clinician: Referring Melenie Minniear: Treating Samual Beals/Extender:Robson, Melanie Crazier, Zenaida Niece in Treatment: 7 Active Problems Location of Pain Severity and Description of Pain Patient Has Paino No Site Locations Pain Management and Medication Current Pain Management: Electronic Signature(s) Signed: 09/04/2019 5:18:15 PM By: Deon Pilling Signed: 11/28/2019 3:04:24 PM By: Sandre Kitty Entered By: Sandre Kitty on 09/04/2019 10:03:38 -------------------------------------------------------------------------------- Patient/Caregiver Education Details Patient Name: Date of Service: Tangee, Marszalek 9/14/2020andnbsp10:00 AM Medical Record [id number] Patient Account Number: [id number] Date of Birth/Gender: March 24, 1981 (38 y.o. F) Treating RN: Deon Pilling Primary Care Physician: Juluis Mire Other Clinician: Referring Physician: Treating Physician/Extender:Robson, Melanie Crazier, Zenaida Niece in Treatment: 7 Education Assessment Education Provided To: Patient Education Topics Provided Wound/Skin Impairment: Handouts: Caring for Your Ulcer Methods: Explain/Verbal Responses: Reinforcements needed Electronic Signature(s) Signed: 09/04/2019 5:18:15 PM By: Deon Pilling Entered By: Deon Pilling on 09/04/2019 10:26:36 -------------------------------------------------------------------------------- Wound Assessment Details Patient Name: Date of Service: Kimisha, Eunice D. 09/04/2019 10:00 AM Medical Record [id number] Patient Account Number: [id number] Date of Birth/Sex: Treating RN: 1981/05/01 (38 y.o. Orvan Falconer Primary Care Kross Swallows: Juluis Mire Other Clinician: Referring Amalio Loe: Treating  Marissia Blackham/Extender:Robson, Melanie Crazier, Zenaida Niece in Treatment: 7 Wound Status Wound Number: 2 Primary Venous Leg Ulcer Etiology: Wound Location: Right Lower Leg - Medial Wound Open Wounding Event: Gradually Appeared Status: Date  Lindsay, [name] ([id number]) Visit Report for 09/04/2019 Arrival Information Details Patient Name: Date of Service: Lindsay [name], Lindsay [name] 09/04/2019 10:00 AM Medical Record [id number] Patient Account Number: [id number] Date of Birth/Sex: Treating RN: [birth year] (38 y.o. Helene Shoe, Tammi Klippel Primary Care Bruk Tumolo: Juluis Mire Other Clinician: Referring Danniell Rotundo: Treating Ellyssa Zagal/Extender:Robson, Melanie Crazier, Zenaida Niece in Treatment: 7 Visit Information History Since Last Visit All ordered tests and consults were completed: No Patient Arrived: Ambulatory Added or deleted any medications: No Arrival Time: 10:00 Any new allergies or adverse reactions: No Accompanied By: self Had a fall or experienced change in No Transfer Assistance: None activities of daily living that may affect Patient Identification Verified: Yes risk of falls: Secondary Verification Process Completed: Yes Signs or symptoms of abuse/neglect since last No Patient Requires Transmission-Based No visito Precautions: Hospitalized since last visit: No Patient Has Alerts: No Implantable device outside of the clinic excluding No cellular tissue based products placed in the center since last visit: Has Dressing in Place as Prescribed: Yes Pain Present Now: No Electronic Signature(s) Signed: 11/28/2019 3:04:22 PM By: Carlene Coria RN Entered By: Carlene Coria on 09/04/2019 10:44:16 -------------------------------------------------------------------------------- Compression Therapy Details Patient Name: Date of Service: Lindsay, [name] 09/04/2019 10:00 AM Medical Record [id number] Patient Account Number: [id number] Date of Birth/Sex: Treating RN: September 26, 1981 (38 y.o. Debby Bud Primary Care Lynnsey Barbara: Juluis Mire Other Clinician: Referring Kandice Schmelter: Treating Jes Costales/Extender:Robson, Melanie Crazier, Zenaida Niece in Treatment: 7 Compression Therapy Performed for Wound Wound  #2 Right,Medial Lower Leg Assessment: Performed By: Jake Church, RN Compression Type: Three Layer Post Procedure Diagnosis Same as Pre-procedure Electronic Signature(s) Signed: 09/04/2019 5:18:15 PM By: Deon Pilling Entered By: Deon Pilling on 09/04/2019 10:29:55 -------------------------------------------------------------------------------- Encounter Discharge Information Details Patient Name: Date of Service: Lindsay Heater D. 09/04/2019 10:00 AM Medical Record [id number] Patient Account Number: [id number] Date of Birth/Sex: Treating RN: 1981-07-26 (38 y.o. Orvan Falconer Primary Care Denese Mentink: Juluis Mire Other Clinician: Referring Britne Borelli: Treating Issa Luster/Extender:Robson, Melanie Crazier, Zenaida Niece in Treatment: 7 Encounter Discharge Information Items Discharge Condition: Stable Ambulatory Status: Wheelchair Discharge Destination: Home Transportation: Private Auto Accompanied By: self Schedule Follow-up Appointment: Yes Clinical Summary of Care: Patient Declined Electronic Signature(s) Signed: 11/28/2019 3:04:22 PM By: Carlene Coria RN Entered By: Carlene Coria on 09/04/2019 10:46:54 -------------------------------------------------------------------------------- Lower Extremity Assessment Details Patient Name: Date of Service: Lindsay, [name] 09/04/2019 10:00 AM Medical Record [id number] Patient Account Number: [id number] Date of Birth/Sex: Treating RN: [birth year] (38 y.o. Orvan Falconer Primary Care Osmar Howton: Juluis Mire Other Clinician: Referring Jovi Alvizo: Treating Brannon Decaire/Extender:Robson, Melanie Crazier, Zenaida Niece in Treatment: 7 Edema Assessment Assessed: [Left: No] [Right: No] Edema: [Left: Ye] [Right: s] Calf Left: Right: Point of Measurement: 39 cm From Medial Instep cm 51 cm Ankle Left: Right: Point of Measurement: 12 cm From Medial Instep cm 25.5 cm Electronic Signature(s) Signed: 11/28/2019 3:04:22  PM By: Carlene Coria RN Entered By: Carlene Coria on 09/04/2019 10:08:26 -------------------------------------------------------------------------------- Multi Wound Chart Details Patient Name: Date of Service: Lindsay Heater D. 09/04/2019 10:00 AM Medical Record [id number] Patient Account Number: [id number] Date of Birth/Sex: Treating RN: 08-16-1981 (38 y.o. Debby Bud Primary Care Tammera Engert: Juluis Mire Other Clinician: Referring Seraj Dunnam: Treating Karon Cotterill/Extender:Robson, Melanie Crazier, Zenaida Niece in Treatment: 7 Vital Signs Height(in): 67 Capillary Blood 144 Glucose(mg/dl): Weight(lbs): 413 Pulse(bpm): 37 Body Mass Index(BMI): 53 Blood Pressure(mmHg): 139/78 Temperature(F): 98.5 Respiratory 18 Rate(breaths/min): Photos: [2:No Photos] [N/A:N/A] Wound Location: [2:Right Lower Leg - Medial N/A] Wounding Event: [2:Gradually Appeared] [N/A:N/A] Primary Etiology: [2:Venous Leg Ulcer] [N/A:N/A] Comorbid History: [2:Lymphedema, Asthma,

## 2019-11-28 NOTE — Progress Notes (Signed)
Lindsay [name], Lindsay [name] ([id number]) Visit Report for 08/29/2019 Arrival Information Details Patient Name: Date of Service: Lindsay, [name] 08/29/2019 10:00 AM Medical Record [id number] Patient Account Number: [id number] Date of Birth/Sex: Treating RN: 1981-06-28 (37 y.o. Freddy Finner Primary Care Conchetta Lamia: Gwinda Passe Other Clinician: Referring Kealan Buchan: Treating Tinya Cadogan/Extender:Robson, Waymon Budge, Casey Burkitt in Treatment: 6 Visit Information History Since Last Visit Cane All ordered tests and consults were completed: No Patient Arrived: 10:15 Added or deleted any medications: No Arrival Time: Any new allergies or adverse reactions: No Accompanied By: self None Had a fall or experienced change in No Transfer Assistance: activities of daily living that may affect Patient Identification Verified: Yes risk of falls: Secondary Verification Process Completed: Yes Signs or symptoms of abuse/neglect since last No Patient Requires Transmission-Based No visito Precautions: Hospitalized since last visit: No Patient Has Alerts: No Implantable device outside of the clinic excluding No cellular tissue based products placed in the center since last visit: Has Dressing in Place as Prescribed: Yes Has Compression in Place as Prescribed: Yes Pain Present Now: No Electronic Signature(s) Signed: 11/28/2019 3:04:22 PM By: Yevonne Pax RN Entered By: Yevonne Pax on 08/29/2019 10:15:36 -------------------------------------------------------------------------------- Compression Therapy Details Patient Name: Date of Service: Lindsay Fritz D. 08/29/2019 10:00 AM Medical Record [id number] Patient Account Number: [id number] Date of Birth/Sex: Treating RN: Apr 23, 1981 (37 y.o. Freddy Finner Primary Care Jeramy Dimmick: Gwinda Passe Other Clinician: Referring Adhrit Krenz: Treating Mazie Fencl/Extender:Robson, Waymon Budge, Casey Burkitt in Treatment: 6 Compression  Therapy Performed for Wound Wound #2 Right,Medial Lower Leg Assessment: Performed By: Little Ishikawa, RN Electronic Signature(s) Signed: 11/28/2019 3:04:22 PM By: Yevonne Pax RN Entered By: Yevonne Pax on 08/29/2019 10:31:48 -------------------------------------------------------------------------------- Encounter Discharge Information Details Patient Name: Date of Service: Lindsay Fritz D. 08/29/2019 10:00 AM Medical Record [id number] Patient Account Number: [id number] Date of Birth/Sex: Treating RN: 06-20-1981 (37 y.o. Freddy Finner Primary Care Gerrald Basu: Gwinda Passe Other Clinician: Referring Maleya Leever: Treating Bethani Brugger/Extender:Robson, Waymon Budge, Casey Burkitt in Treatment: 6 Encounter Discharge Information Items Discharge Condition: Stable Ambulatory Status: Cane Discharge Destination: Home Transportation: Private Auto Accompanied By: self Schedule Follow-up Appointment: Yes Clinical Summary of Care: Patient Declined Electronic Signature(s) Signed: 11/28/2019 3:04:22 PM By: Yevonne Pax RN Entered By: Yevonne Pax on 08/29/2019 10:33:51 -------------------------------------------------------------------------------- Patient/Caregiver Education Details Patient Name: Date of Service: [name], Lindsay D. 9/8/2020andnbsp10:00 AM Medical Record [id number] Patient Account Number: [id number] Date of Birth/Gender: Jun 08, 1981 (37 y.o. F) Treating RN: Yevonne Pax Primary Care Physician: Gwinda Passe Other Clinician: Referring Physician: Treating Physician/Extender:Robson, Waymon Budge, Casey Burkitt in Treatment: 6 Education Assessment Education Provided To: Patient Education Topics Provided Wound/Skin Impairment: Methods: Explain/Verbal Responses: State content correctly Electronic Signature(s) Signed: 11/28/2019 3:04:22 PM By: Yevonne Pax RN Entered By: Yevonne Pax on 08/29/2019  10:33:23 -------------------------------------------------------------------------------- Wound Assessment Details Patient Name: Date of Service: Lindsay Fritz D. 08/29/2019 10:00 AM Medical Record [id number] Patient Account Number: [id number] Date of Birth/Sex: Treating RN: 01/28/1981 (37 y.o. Freddy Finner Primary Care Katerra Ingman: Gwinda Passe Other Clinician: Referring Omid Deardorff: Treating Aleksa Collinsworth/Extender:Robson, Waymon Budge, Casey Burkitt in Treatment: 6 Wound Status Wound Number: 2 Primary Venous Leg Ulcer Etiology: Wound Location: Right Lower Leg - Medial Wound Open Wounding Event: Gradually Appeared Status: Date Acquired: 07/07/2019 Comorbid Lymphedema, Asthma, Congestive Heart Weeks Of Treatment: 6 History: Failure, Coronary Artery Disease, Clustered Wound: No Hypertension, Myocardial Infarction, Hepatitis B, Type II Diabetes, Neuropathy Wound Measurements Length: (cm) 2 % Reduct Width: (cm) 1.7 % Reduct Depth: (cm) 0.1 Epitheli Area: (cm) 2.67 Tunneli Volume: (cm) 0.267 Undermi Wound Description  Classification: Full Thickness Without Exposed Support Foul Od Structures Slough/ Wound Flat and Intact Margin: Exudate Medium Amount: Exudate Serosanguineous Type: Exudate red, brown Color: Wound Bed Granulation Amount: Large (67-100%) Granulation Quality: Red, Pink Fascia E Necrotic Amount: Small (1-33%) Fat Laye Necrotic Quality: Adherent Slough Tendon E Muscle E Joint Ex Bone Exposed or After Cleansing: No Fibrino Yes Exposed Structure xposed: No r (Subcutaneous Tissue) Exposed: Yes xposed: No xposed: No posed: No : No ion in Area: 22.7% ion in Volume: 22.8% alization: Small (1-33%) ng: No ning: No Electronic Signature(s) Signed: 11/28/2019 3:04:22 PM By: Yevonne Pax RN Entered By: Yevonne Pax on 08/29/2019 10:30:57 -------------------------------------------------------------------------------- Vitals Details Patient  Name: Date of Service: Lindsay Fritz D. 08/29/2019 10:00 AM Medical Record [id number] Patient Account Number: [id number] Date of Birth/Sex: Treating RN: 1981/10/14 (37 y.o. Freddy Finner Primary Care Adyn Serna: Gwinda Passe Other Clinician: Referring Lalah Durango: Treating Rasean Joos/Extender:Robson, Waymon Budge, Casey Burkitt in Treatment: 6 Vital Signs Time Taken: 10:15 Temperature (F): 98.6 Height (in): 67 Pulse (bpm): 83 Weight (lbs): 413 Respiratory Rate (breaths/min): 18 Body Mass Index (BMI): 64.7 Blood Pressure (mmHg): 160/88 Capillary Blood Glucose (mg/dl): 062 Reference Range: 80 - 120 mg / dl Notes CBG per patient Electronic Signature(s) Signed: 11/28/2019 3:04:22 PM By: Yevonne Pax RN Entered By: Yevonne Pax on 08/29/2019 10:16:06

## 2019-11-28 NOTE — Progress Notes (Signed)
Lindsay [name], Lindsay [name] ([id number]) Visit Report for 08/21/2019 Arrival Information Details Patient Name: Date of Service: Lindsay [name], Lindsay [name] 08/21/2019 12:30 PM Medical Record [id number] Patient Account Number: [id number] Date of Birth/Sex: Treating RN: [birth year] (38 y.o. Orvan Falconer Primary Care Provider: Juluis Mire Other Clinician: Referring Provider: Treating Provider/Extender:Robson, Melanie Crazier, Zenaida Niece in Treatment: 5 Visit Information History Since Last Visit All ordered tests and consults were completed: No Patient Arrived: Ambulatory Added or deleted any medications: No Arrival Time: 13:03 Any new allergies or adverse reactions: No Accompanied By: self Had a fall or experienced change in No Transfer Assistance: None activities of daily living that may affect Patient Identification Verified: Yes risk of falls: Secondary Verification Process Completed: Yes Signs or symptoms of abuse/neglect since last No visito Hospitalized since last visit: No Implantable device outside of the clinic excluding No cellular tissue based products placed in the center since last visit: Has Dressing in Place as Prescribed: Yes Has Compression in Place as Prescribed: Yes Pain Present Now: No Electronic Signature(s) Signed: 11/28/2019 3:07:45 PM By: Carlene Coria RN Entered By: Carlene Coria on 08/21/2019 13:04:05 -------------------------------------------------------------------------------- Compression Therapy Details Patient Name: Date of Service: Lindsay [name], Lindsay [name] 08/21/2019 12:30 PM Medical Record [id number] Patient Account Number: [id number] Date of Birth/Sex: Treating RN: Aug 25, 1981 (38 y.o. [name] Primary Care Provider: Juluis Mire Other Clinician: Referring Provider: Treating Provider/Extender:Robson, Melanie Crazier, Zenaida Niece in Treatment: 5 Compression Therapy Performed for Wound Wound #2 Right,Medial Lower  Leg Assessment: Performed By: Clinician Levan Hurst, RN Compression Type: Three Layer Post Procedure Diagnosis Same as Pre-procedure Electronic Signature(s) Signed: 08/21/2019 6:17:49 PM By: Levan Hurst RN, BSN Entered By: Levan Hurst on 08/21/2019 13:29:34 -------------------------------------------------------------------------------- Encounter Discharge Information Details Patient Name: Date of Service: Lindsay Heater D. 08/21/2019 12:30 PM Medical Record [id number] Patient Account Number: [id number] Date of Birth/Sex: Treating RN: [birth year] (38 y.o. Debby Bud Primary Care Provider: Juluis Mire Other Clinician: Referring Provider: Treating Provider/Extender:Robson, Melanie Crazier, Zenaida Niece in Treatment: 5 Encounter Discharge Information Items Post Procedure Vitals Discharge Condition: Stable Temperature (F): 98.3 Ambulatory Status: Cane Pulse (bpm): 79 Discharge Destination: Home Respiratory Rate (breaths/min): 20 Transportation: Private Auto Blood Pressure (mmHg): 165/94 Accompanied By: self Schedule Follow-up Appointment: Yes Clinical Summary of Care: Electronic Signature(s) Signed: 08/21/2019 5:34:15 PM By: Deon Pilling Entered By: Deon Pilling on 08/21/2019 13:50:26 -------------------------------------------------------------------------------- Lower Extremity Assessment Details Patient Name: Date of Service: Lindsay [name], Lindsay [name] 08/21/2019 12:30 PM Medical Record [id number] Patient Account Number: [id number] Date of Birth/Sex: Treating RN: October 11, 1981 (38 y.o. Orvan Falconer Primary Care Provider: Juluis Mire Other Clinician: Referring Provider: Treating Provider/Extender:Robson, Melanie Crazier, Zenaida Niece in Treatment: 5 Edema Assessment Assessed: [Left: No] [Right: No] Edema: [Left: Ye] [Right: s] Calf Left: Right: Point of Measurement: 39 cm From Medial Instep cm 51 cm Ankle Left: Right: Point of  Measurement: 12 cm From Medial Instep cm 26 cm Electronic Signature(s) Signed: 11/28/2019 3:07:45 PM By: Carlene Coria RN Entered By: Carlene Coria on 08/21/2019 13:05:17 -------------------------------------------------------------------------------- Multi Wound Chart Details Patient Name: Date of Service: Lindsay Heater D. 08/21/2019 12:30 PM Medical Record [id number] Patient Account Number: [id number] Date of Birth/Sex: Treating RN: 11/19/1981 (38 y.o. [name] Primary Care Provider: Juluis Mire Other Clinician: Referring Provider: Treating Provider/Extender:Robson, Melanie Crazier, Zenaida Niece in Treatment: 5 Vital Signs Height(in): 67 Pulse(bpm): 56 Weight(lbs): 361 Blood Pressure(mmHg): 165/94 Body Mass Index(BMI): 65 Temperature(F): 98.3 Respiratory 20 Rate(breaths/min): Photos: [2:No Photos] [N/A:N/A] Wound Location: [2:Right Lower Leg - Medial N/A] Wounding Event: [2:Gradually Appeared] [N/A:N/A]  Lindsay [name], Lindsay [name] ([id number]) Visit Report for 08/21/2019 Arrival Information Details Patient Name: Date of Service: Lindsay [name], Lindsay [name] 08/21/2019 12:30 PM Medical Record [id number] Patient Account Number: [id number] Date of Birth/Sex: Treating RN: [birth year] (38 y.o. Orvan Falconer Primary Care : Juluis Mire Other Clinician: Referring : Treating /Extender:Robson, Melanie Crazier, Zenaida Niece in Treatment: 5 Visit Information History Since Last Visit All ordered tests and consults were completed: No Patient Arrived: Ambulatory Added or deleted any medications: No Arrival Time: 13:03 Any new allergies or adverse reactions: No Accompanied By: self Had a fall or experienced change in No Transfer Assistance: None activities of daily living that may affect Patient Identification Verified: Yes risk of falls: Secondary Verification Process Completed: Yes Signs or symptoms of abuse/neglect since last No visito Hospitalized since last visit: No Implantable device outside of the clinic excluding No cellular tissue based products placed in the center since last visit: Has Dressing in Place as Prescribed: Yes Has Compression in Place as Prescribed: Yes Pain Present Now: No Electronic Signature(s) Signed: 11/28/2019 3:07:45 PM By: Carlene Coria RN Entered By: Carlene Coria on 08/21/2019 13:04:05 -------------------------------------------------------------------------------- Compression Therapy Details Patient Name: Date of Service: Lindsay [name], Lindsay [name] 08/21/2019 12:30 PM Medical Record [id number] Patient Account Number: [id number] Date of Birth/Sex: Treating RN: Aug 25, 1981 (38 y.o. [name] Primary Care : Juluis Mire Other Clinician: Referring : Treating /Extender:Robson, Melanie Crazier, Zenaida Niece in Treatment: 5 Compression Therapy Performed for Wound Wound #2 Right,Medial Lower  Leg Assessment: Performed By: Clinician Levan Hurst, RN Compression Type: Three Layer Post Procedure Diagnosis Same as Pre-procedure Electronic Signature(s) Signed: 08/21/2019 6:17:49 PM By: Levan Hurst RN, BSN Entered By: Levan Hurst on 08/21/2019 13:29:34 -------------------------------------------------------------------------------- Encounter Discharge Information Details Patient Name: Date of Service: Lindsay Heater D. 08/21/2019 12:30 PM Medical Record [id number] Patient Account Number: [id number] Date of Birth/Sex: Treating RN: [birth year] (38 y.o. Debby Bud Primary Care : Juluis Mire Other Clinician: Referring : Treating /Extender:Robson, Melanie Crazier, Zenaida Niece in Treatment: 5 Encounter Discharge Information Items Post Procedure Vitals Discharge Condition: Stable Temperature (F): 98.3 Ambulatory Status: Cane Pulse (bpm): 79 Discharge Destination: Home Respiratory Rate (breaths/min): 20 Transportation: Private Auto Blood Pressure (mmHg): 165/94 Accompanied By: self Schedule Follow-up Appointment: Yes Clinical Summary of Care: Electronic Signature(s) Signed: 08/21/2019 5:34:15 PM By: Deon Pilling Entered By: Deon Pilling on 08/21/2019 13:50:26 -------------------------------------------------------------------------------- Lower Extremity Assessment Details Patient Name: Date of Service: Lindsay [name], Lindsay [name] 08/21/2019 12:30 PM Medical Record [id number] Patient Account Number: [id number] Date of Birth/Sex: Treating RN: October 11, 1981 (38 y.o. Orvan Falconer Primary Care : Juluis Mire Other Clinician: Referring : Treating /Extender:Robson, Melanie Crazier, Zenaida Niece in Treatment: 5 Edema Assessment Assessed: [Left: No] [Right: No] Edema: [Left: Ye] [Right: s] Calf Left: Right: Point of Measurement: 39 cm From Medial Instep cm 51 cm Ankle Left: Right: Point of  Measurement: 12 cm From Medial Instep cm 26 cm Electronic Signature(s) Signed: 11/28/2019 3:07:45 PM By: Carlene Coria RN Entered By: Carlene Coria on 08/21/2019 13:05:17 -------------------------------------------------------------------------------- Multi Wound Chart Details Patient Name: Date of Service: Lindsay Heater D. 08/21/2019 12:30 PM Medical Record [id number] Patient Account Number: [id number] Date of Birth/Sex: Treating RN: 11/19/1981 (38 y.o. [name] Primary Care : Juluis Mire Other Clinician: Referring : Treating /Extender:Robson, Melanie Crazier, Zenaida Niece in Treatment: 5 Vital Signs Height(in): 67 Pulse(bpm): 56 Weight(lbs): 361 Blood Pressure(mmHg): 165/94 Body Mass Index(BMI): 65 Temperature(F): 98.3 Respiratory 20 Rate(breaths/min): Photos: [2:No Photos] [N/A:N/A] Wound Location: [2:Right Lower Leg - Medial N/A] Wounding Event: [2:Gradually Appeared] [N/A:N/A]  Lindsay [name], Lindsay [name] ([id number]) Visit Report for 08/21/2019 Arrival Information Details Patient Name: Date of Service: Lindsay [name], Lindsay [name] 08/21/2019 12:30 PM Medical Record [id number] Patient Account Number: [id number] Date of Birth/Sex: Treating RN: [birth year] (38 y.o. Orvan Falconer Primary Care Myleigh Amara: Juluis Mire Other Clinician: Referring Anil Havard: Treating Darden Flemister/Extender:Robson, Melanie Crazier, Zenaida Niece in Treatment: 5 Visit Information History Since Last Visit All ordered tests and consults were completed: No Patient Arrived: Ambulatory Added or deleted any medications: No Arrival Time: 13:03 Any new allergies or adverse reactions: No Accompanied By: self Had a fall or experienced change in No Transfer Assistance: None activities of daily living that may affect Patient Identification Verified: Yes risk of falls: Secondary Verification Process Completed: Yes Signs or symptoms of abuse/neglect since last No visito Hospitalized since last visit: No Implantable device outside of the clinic excluding No cellular tissue based products placed in the center since last visit: Has Dressing in Place as Prescribed: Yes Has Compression in Place as Prescribed: Yes Pain Present Now: No Electronic Signature(s) Signed: 11/28/2019 3:07:45 PM By: Carlene Coria RN Entered By: Carlene Coria on 08/21/2019 13:04:05 -------------------------------------------------------------------------------- Compression Therapy Details Patient Name: Date of Service: Lindsay [name], Lindsay [name] 08/21/2019 12:30 PM Medical Record [id number] Patient Account Number: [id number] Date of Birth/Sex: Treating RN: Aug 25, 1981 (38 y.o. [name] Primary Care Gianpaolo Mindel: Juluis Mire Other Clinician: Referring Latandra Loureiro: Treating Nyjah Denio/Extender:Robson, Melanie Crazier, Zenaida Niece in Treatment: 5 Compression Therapy Performed for Wound Wound #2 Right,Medial Lower  Leg Assessment: Performed By: Clinician Levan Hurst, RN Compression Type: Three Layer Post Procedure Diagnosis Same as Pre-procedure Electronic Signature(s) Signed: 08/21/2019 6:17:49 PM By: Levan Hurst RN, BSN Entered By: Levan Hurst on 08/21/2019 13:29:34 -------------------------------------------------------------------------------- Encounter Discharge Information Details Patient Name: Date of Service: Lindsay Heater D. 08/21/2019 12:30 PM Medical Record [id number] Patient Account Number: [id number] Date of Birth/Sex: Treating RN: [birth year] (38 y.o. Debby Bud Primary Care Sirinity Outland: Juluis Mire Other Clinician: Referring Duwan Adrian: Treating Sayeed Weatherall/Extender:Robson, Melanie Crazier, Zenaida Niece in Treatment: 5 Encounter Discharge Information Items Post Procedure Vitals Discharge Condition: Stable Temperature (F): 98.3 Ambulatory Status: Cane Pulse (bpm): 79 Discharge Destination: Home Respiratory Rate (breaths/min): 20 Transportation: Private Auto Blood Pressure (mmHg): 165/94 Accompanied By: self Schedule Follow-up Appointment: Yes Clinical Summary of Care: Electronic Signature(s) Signed: 08/21/2019 5:34:15 PM By: Deon Pilling Entered By: Deon Pilling on 08/21/2019 13:50:26 -------------------------------------------------------------------------------- Lower Extremity Assessment Details Patient Name: Date of Service: Lindsay [name], Lindsay [name] 08/21/2019 12:30 PM Medical Record [id number] Patient Account Number: [id number] Date of Birth/Sex: Treating RN: October 11, 1981 (38 y.o. Orvan Falconer Primary Care Sulamita Lafountain: Juluis Mire Other Clinician: Referring Galen Russman: Treating Santiana Glidden/Extender:Robson, Melanie Crazier, Zenaida Niece in Treatment: 5 Edema Assessment Assessed: [Left: No] [Right: No] Edema: [Left: Ye] [Right: s] Calf Left: Right: Point of Measurement: 39 cm From Medial Instep cm 51 cm Ankle Left: Right: Point of  Measurement: 12 cm From Medial Instep cm 26 cm Electronic Signature(s) Signed: 11/28/2019 3:07:45 PM By: Carlene Coria RN Entered By: Carlene Coria on 08/21/2019 13:05:17 -------------------------------------------------------------------------------- Multi Wound Chart Details Patient Name: Date of Service: Lindsay Heater D. 08/21/2019 12:30 PM Medical Record [id number] Patient Account Number: [id number] Date of Birth/Sex: Treating RN: 11/19/1981 (38 y.o. [name] Primary Care Culver Feighner: Juluis Mire Other Clinician: Referring Leontyne Manville: Treating Jadarious Dobbins/Extender:Robson, Melanie Crazier, Zenaida Niece in Treatment: 5 Vital Signs Height(in): 67 Pulse(bpm): 56 Weight(lbs): 361 Blood Pressure(mmHg): 165/94 Body Mass Index(BMI): 65 Temperature(F): 98.3 Respiratory 20 Rate(breaths/min): Photos: [2:No Photos] [N/A:N/A] Wound Location: [2:Right Lower Leg - Medial N/A] Wounding Event: [2:Gradually Appeared] [N/A:N/A]

## 2019-11-28 NOTE — Progress Notes (Signed)
Lindsay [name] ([id number]) Visit Report for 10/02/2019 Arrival Information Details Patient Name: Date of Service: Lindsay [name] 10/02/2019 9:00 AM Medical Record [id number] Patient Account Number: [id number] Date of Birth/Sex: Treating RN: [birth year] (38 y.o. Lindsay [name] Primary Care Auren Valdes: Gwinda Passe Other Clinician: Referring Chistina Roston: Treating Makala Fetterolf/Extender:Robson, Waymon Budge, Casey Burkitt in Treatment: 11 Visit Information History Since Last Visit Added or deleted any medications: No Patient Arrived: Lindsay [name] Any new allergies or adverse reactions: No Arrival Time: 08:57 daughter Had a fall or experienced change in No Accompanied By: activities of daily living that may affect Transfer Assistance: None risk of falls: Patient Requires Transmission-Based No Signs or symptoms of abuse/neglect since last No Precautions: visito Patient Has Alerts: No Hospitalized since last visit: No Implantable device outside of the clinic excluding No cellular tissue based products placed in the center since last visit: Has Dressing in Place as Prescribed: Yes Pain Present Now: No Electronic Signature(s) Signed: 11/28/2019 3:02:15 PM By: Karl Ito Entered By: Karl Ito on 10/02/2019 08:59:18 -------------------------------------------------------------------------------- Clinic Level of Care Assessment Details Patient Name: Date of Service: Lindsay [name] 10/02/2019 9:00 AM Medical Record [id number] Patient Account Number: [id number] Date of Birth/Sex: Treating RN: [birth year] (39 y.o. Lindsay [name] Primary Care Korin Hartwell: Gwinda Passe Other Clinician: Referring Jadene Stemmer: Treating Junelle Hashemi/Extender:Robson, Waymon Budge, Casey Burkitt in Treatment: 11 Clinic Level of Care Assessment Items TOOL 4 Quantity Score X - Use when only an EandM is performed on FOLLOW-UP visit 1 0 ASSESSMENTS - Nursing Assessment /  Reassessment X - Reassessment of Co-morbidities (includes updates in patient status) 1 10 X - Reassessment of Adherence to Treatment Plan 1 5 ASSESSMENTS - Wound and Skin Assessment / Reassessment X - Simple Wound Assessment / Reassessment - one wound 1 5 []  - Complex Wound Assessment / Reassessment - multiple wounds 0 []  - Dermatologic / Skin Assessment (not related to wound area) 0 ASSESSMENTS - Focused Assessment []  - Circumferential Edema Measurements - multi extremities 0 []  - Nutritional Assessment / Counseling / Intervention 0 []  - Lower Extremity Assessment (monofilament, tuning fork, pulses) 0 []  - Peripheral Arterial Disease Assessment (using hand held doppler) 0 ASSESSMENTS - Ostomy and/or Continence Assessment and Care []  - Incontinence Assessment and Management 0 []  - Ostomy Care Assessment and Management (repouching, etc.) 0 PROCESS - Coordination of Care X - Simple Patient / Family Education for ongoing care 1 15 []  - Complex (extensive) Patient / Family Education for ongoing care 0 X - Staff obtains , Records, Test Results / Process Orders 1 10 []  - Staff telephones HHA, Nursing Homes / Clarify orders / etc 0 []  - Routine Transfer to another Facility (non-emergent condition) 0 []  - Routine Hospital Admission (non-emergent condition) 0 []  - New Admissions / / Ordering NPWT, Apligraf, etc. 0 []  - Emergency Hospital Admission (emergent condition) 0 X - Simple Discharge Coordination 1 10 []  - Complex (extensive) Discharge Coordination 0 PROCESS - Special Needs []  - Pediatric / Minor Patient Management 0 []  - Isolation Patient Management 0 []  - Hearing / Language / Visual special needs 0 []  - Assessment of Community assistance (transportation, D/C planning, etc.) 0 []  - Additional assistance / Altered mentation 0 []  - Support Surface(s) Assessment (bed, cushion, seat, etc.) 0 INTERVENTIONS - Wound Cleansing / Measurement X - Simple Wound  Cleansing - one wound 1 5 []  - Complex Wound Cleansing - multiple wounds 0 X - Wound Imaging (photographs - any number of wounds) 1 5 []  - Wound  Lindsay [name] ([id number]) Visit Report for 10/02/2019 Arrival Information Details Patient Name: Date of Service: Lindsay [name] 10/02/2019 9:00 AM Medical Record [id number] Patient Account Number: [id number] Date of Birth/Sex: Treating RN: [birth year] (38 y.o. Lindsay [name] Primary Care Auren Valdes: Gwinda Passe Other Clinician: Referring Chistina Roston: Treating Makala Fetterolf/Extender:Robson, Waymon Budge, Casey Burkitt in Treatment: 11 Visit Information History Since Last Visit Added or deleted any medications: No Patient Arrived: Lindsay [name] Any new allergies or adverse reactions: No Arrival Time: 08:57 daughter Had a fall or experienced change in No Accompanied By: activities of daily living that may affect Transfer Assistance: None risk of falls: Patient Requires Transmission-Based No Signs or symptoms of abuse/neglect since last No Precautions: visito Patient Has Alerts: No Hospitalized since last visit: No Implantable device outside of the clinic excluding No cellular tissue based products placed in the center since last visit: Has Dressing in Place as Prescribed: Yes Pain Present Now: No Electronic Signature(s) Signed: 11/28/2019 3:02:15 PM By: Karl Ito Entered By: Karl Ito on 10/02/2019 08:59:18 -------------------------------------------------------------------------------- Clinic Level of Care Assessment Details Patient Name: Date of Service: Lindsay [name] 10/02/2019 9:00 AM Medical Record [id number] Patient Account Number: [id number] Date of Birth/Sex: Treating RN: [birth year] (39 y.o. Lindsay [name] Primary Care Korin Hartwell: Gwinda Passe Other Clinician: Referring Jadene Stemmer: Treating Junelle Hashemi/Extender:Robson, Waymon Budge, Casey Burkitt in Treatment: 11 Clinic Level of Care Assessment Items TOOL 4 Quantity Score X - Use when only an EandM is performed on FOLLOW-UP visit 1 0 ASSESSMENTS - Nursing Assessment /  Reassessment X - Reassessment of Co-morbidities (includes updates in patient status) 1 10 X - Reassessment of Adherence to Treatment Plan 1 5 ASSESSMENTS - Wound and Skin Assessment / Reassessment X - Simple Wound Assessment / Reassessment - one wound 1 5 []  - Complex Wound Assessment / Reassessment - multiple wounds 0 []  - Dermatologic / Skin Assessment (not related to wound area) 0 ASSESSMENTS - Focused Assessment []  - Circumferential Edema Measurements - multi extremities 0 []  - Nutritional Assessment / Counseling / Intervention 0 []  - Lower Extremity Assessment (monofilament, tuning fork, pulses) 0 []  - Peripheral Arterial Disease Assessment (using hand held doppler) 0 ASSESSMENTS - Ostomy and/or Continence Assessment and Care []  - Incontinence Assessment and Management 0 []  - Ostomy Care Assessment and Management (repouching, etc.) 0 PROCESS - Coordination of Care X - Simple Patient / Family Education for ongoing care 1 15 []  - Complex (extensive) Patient / Family Education for ongoing care 0 X - Staff obtains , Records, Test Results / Process Orders 1 10 []  - Staff telephones HHA, Nursing Homes / Clarify orders / etc 0 []  - Routine Transfer to another Facility (non-emergent condition) 0 []  - Routine Hospital Admission (non-emergent condition) 0 []  - New Admissions / / Ordering NPWT, Apligraf, etc. 0 []  - Emergency Hospital Admission (emergent condition) 0 X - Simple Discharge Coordination 1 10 []  - Complex (extensive) Discharge Coordination 0 PROCESS - Special Needs []  - Pediatric / Minor Patient Management 0 []  - Isolation Patient Management 0 []  - Hearing / Language / Visual special needs 0 []  - Assessment of Community assistance (transportation, D/C planning, etc.) 0 []  - Additional assistance / Altered mentation 0 []  - Support Surface(s) Assessment (bed, cushion, seat, etc.) 0 INTERVENTIONS - Wound Cleansing / Measurement X - Simple Wound  Cleansing - one wound 1 5 []  - Complex Wound Cleansing - multiple wounds 0 X - Wound Imaging (photographs - any number of wounds) 1 5 []  - Wound  Tracing (instead of photographs) 0 X - Simple Wound Measurement - one wound 1 5 []  - Complex Wound Measurement - multiple wounds 0 INTERVENTIONS - Wound Dressings []  - Small Wound Dressing one or multiple wounds 0 []  - Medium Wound Dressing one or multiple wounds 0 []  - Large Wound Dressing one or multiple wounds 0 []  - Application of Medications - topical 0 []  - Application of Medications - injection 0 INTERVENTIONS - Miscellaneous []  - External ear exam 0 []  - Specimen Collection (cultures, biopsies, blood, body fluids, etc.) 0 []  - Specimen(s) / Culture(s) sent or taken to Lab for analysis 0 []  - Patient Transfer (multiple staff / [employer] fast streamer / Similar devices) 0 []  - Simple Staple / Suture removal (25 or less) 0 []  - Complex Staple / Suture removal (26 or more) 0 []  - Hypo / Hyperglycemic Management (close monitor of Blood Glucose) 0 []  - Ankle / Brachial Index (ABI) - do not check if billed separately 0 X - Vital Signs 1 5 Has the patient been seen at the hospital within the last three years: Yes Total Score: 75 Level Of Care: New/Established - Level 2 Electronic Signature(s) Signed: 10/02/2019 5:18:03 PM By: Kela Millin Entered By: Kela Millin on 10/02/2019 10:13:37 -------------------------------------------------------------------------------- Multi Wound Chart Details Patient Name: Date of Service: [name] 10/02/2019 9:00 AM Medical Record [id number] Patient Account Number: [id number] Date of Birth/Sex: Treating RN: 01-Aug-1981 (38 y.o. 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] [N/A:N/A] Comorbid History: [2:Lymphedema, Asthma, Congestive Heart Failure, Coronary Artery Disease, Hypertension, Myocardial Infarction, Hepatitis B, Type II Diabetes, Neuropathy] [N/A:N/A] Date Acquired: [2:07/07/2019] [N/A:N/A] Weeks of Treatment: [2:11] [N/A:N/A] Wound Status: [2:Open] [N/A:N/A] Measurements L x W x D 0x0x0 [N/A:N/A] (cm) Area (cm) : [2:0] [N/A:N/A] Volume (cm) : [2:0] [N/A:N/A] % Reduction in Area: [2:100.00%] [N/A:N/A] % Reduction in Volume: 100.00% [N/A:N/A] Classification: [2:Full Thickness Without Exposed Support Structures] [N/A:N/A] Exudate Amount: [2:Medium] [N/A:N/A] Exudate Type: [2:Serosanguineous] [N/A:N/A] Exudate Color: [2:red, brown] [N/A:N/A] Wound Margin: [2:Flat and Intact] [N/A:N/A] Granulation Amount: [2:Medium (34-66%)] [N/A:N/A] Granulation Quality: [2:Red, Pink] [N/A:N/A] Necrotic Amount: [2:Medium (34-66%)] [N/A:N/A] Exposed Structures: [2:Fat Layer (Subcutaneous N/A Tissue) Exposed: Yes Fascia: No Tendon: No Muscle: No Joint: No Bone: No Medium (34-66%)] [N/A:N/A] Treatment Notes Electronic Signature(s) Signed: 10/02/2019 5:50:55 PM By: Linton Ham MD Signed: 10/03/2019 5:52:09 PM By: Levan Hurst RN, BSN Entered By: Linton Ham on 10/02/2019 10:17:53 -------------------------------------------------------------------------------- Multi-Disciplinary Care Plan Details Patient Name: Date of Service: [name] 10/02/2019 9:00 AM Medical Record [id number] Patient Account Number: [id number] Date of Birth/Sex: Treating RN: 07-Oct-1981 (38 y.o. Clearnce Sorrel Primary Care Kortny Lirette: Juluis Mire Other Clinician: Referring Bettylee Feig: Treating  Kharisma Glasner/Extender:Robson, Melanie Crazier, Zenaida Niece in Treatment: 11 Active Inactive Electronic Signature(s) Signed: 10/02/2019 5:18:03 PM By: Kela Millin Entered By: Kela Millin on 10/02/2019 09:37:59 -------------------------------------------------------------------------------- Pain Assessment Details Patient Name: Date of Service: LARIYAH, SHETTERLY 10/02/2019 9:00 AM Medical Record [id number] Patient Account Number: [id number] Date of Birth/Sex: Treating RN: September 24, 1981 (38 y.o. [name] Primary Care Desani Sprung: Juluis Mire Other Clinician: Referring Viviann Broyles: Treating Kerby Hockley/Extender:Robson, Melanie Crazier, Zenaida Niece in Treatment: 11 Active Problems Location of Pain Severity and Description of Pain Patient

## 2019-11-28 NOTE — Progress Notes (Signed)
Lindsay, [name] ([id number]) Visit Report for 09/18/2019 Arrival Information Details Patient Name: Date of Service: Lindsay [name], Lindsay [name] 09/18/2019 11:00 AM Medical Record [id number] Patient Account Number: [id number] Date of Birth/Sex: Treating RN: Oct 29, 1981 (38 y.o. Orvan Falconer Primary Care Maxxon Schwanke: Juluis Mire Other Clinician: Referring Sherley Leser: Treating Rector Devonshire/Extender:Robson, Melanie Crazier, Zenaida Niece in Treatment: 9 Visit Information History Since Last Visit Cane All ordered tests and consults were completed: No Patient Arrived: 11:01 Added or deleted any medications: No Arrival Time: Any new allergies or adverse reactions: No Accompanied By: self None Had a fall or experienced change in No Transfer Assistance: activities of daily living that may affect Patient Identification Verified: Yes risk of falls: Secondary Verification Process Completed: Yes Signs or symptoms of abuse/neglect since last No Patient Requires Transmission-Based No visito Precautions: Hospitalized since last visit: No Patient Has Alerts: No Implantable device outside of the clinic excluding No cellular tissue based products placed in the center since last visit: Has Dressing in Place as Prescribed: Yes Has Compression in Place as Prescribed: Yes Pain Present Now: No Electronic Signature(s) Signed: 11/28/2019 2:58:33 PM By: Carlene Coria RN Entered By: Carlene Coria on 09/18/2019 11:01:43 -------------------------------------------------------------------------------- Compression Therapy Details Patient Name: Date of Service: Lindsay, [name] 09/18/2019 11:00 AM Medical Record [id number] Patient Account Number: [id number] Date of Birth/Sex: Treating RN: Nov 05, 1981 (38 y.o. Clearnce Sorrel Primary Care Teira Arcilla: Juluis Mire Other Clinician: Referring Akya Fiorello: Treating Daiwik Buffalo/Extender:Robson, Melanie Crazier, Zenaida Niece in Treatment:  9 Compression Therapy Performed for Wound Wound #2 Right,Medial Lower Leg Assessment: Performed By: Clinician Kela Millin, RN Compression Type: Three Layer Post Procedure Diagnosis Same as Pre-procedure Electronic Signature(s) Signed: 09/28/2019 4:32:31 PM By: Kela Millin Entered By: Kela Millin on 09/18/2019 12:13:52 -------------------------------------------------------------------------------- Encounter Discharge Information Details Patient Name: Date of Service: Lindsay Heater D. 09/18/2019 11:00 AM Medical Record [id number] Patient Account Number: [id number] Date of Birth/Sex: Treating RN: Jun 19, 1981 (38 y.o. Debby Bud Primary Care Amillion Macchia: Juluis Mire Other Clinician: Referring Zedrick Springsteen: Treating Gryffin Altice/Extender:Robson, Melanie Crazier, Zenaida Niece in Treatment: 9 Encounter Discharge Information Items Discharge Condition: Stable Ambulatory Status: Cane Discharge Destination: Home Transportation: Private Auto Accompanied By: self Schedule Follow-up Appointment: Yes Clinical Summary of Care: Electronic Signature(s) Signed: 09/18/2019 5:51:45 PM By: Deon Pilling Entered By: Deon Pilling on 09/18/2019 14:14:30 -------------------------------------------------------------------------------- Lower Extremity Assessment Details Patient Name: Date of Service: Lindsay, [name] 09/18/2019 11:00 AM Medical Record [id number] Patient Account Number: [id number] Date of Birth/Sex: Treating RN: 1981/04/06 (38 y.o. Orvan Falconer Primary Care Delson Dulworth: Juluis Mire Other Clinician: Referring Mercades Bajaj: Treating Karimah Winquist/Extender:Robson, Melanie Crazier, Zenaida Niece in Treatment: 9 Edema Assessment Assessed: [Left: No] [Right: No] Edema: [Left: Ye] [Right: s] Calf Left: Right: Point of Measurement: 39 cm From Medial Instep cm 52 cm Ankle Left: Right: Point of Measurement: 12 cm From Medial Instep cm 26 cm Electronic  Signature(s) Signed: 11/28/2019 2:58:33 PM By: Carlene Coria RN Entered By: Carlene Coria on 09/18/2019 11:08:07 -------------------------------------------------------------------------------- Multi Wound Chart Details Patient Name: Date of Service: Lindsay Heater D. 09/18/2019 11:00 AM Medical Record [id number] Patient Account Number: [id number] Date of Birth/Sex: Treating RN: Feb 11, 1981 (38 y.o. 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 Orvan Falconer Primary Care Maxxon Schwanke: Juluis Mire Other Clinician: Referring Sherley Leser: Treating Rector Devonshire/Extender:Robson, Melanie Crazier, Zenaida Niece in Treatment: 9 Visit Information History Since Last Visit Cane All ordered tests and consults were completed: No Patient Arrived: 11:01 Added or deleted any medications: No Arrival Time: Any new allergies or adverse reactions: No Accompanied By: self None Had a fall or experienced change in No Transfer Assistance: activities of daily living that may affect Patient Identification Verified: Yes risk of falls: Secondary Verification Process Completed: Yes Signs or symptoms of abuse/neglect since last No Patient Requires Transmission-Based No visito Precautions: Hospitalized since last visit: No Patient Has Alerts: No Implantable device outside of the clinic excluding No cellular tissue based products placed in the center since last visit: Has Dressing in Place as Prescribed: Yes Has Compression in Place as Prescribed: Yes Pain Present Now: No Electronic Signature(s) Signed: 11/28/2019 2:58:33 PM By: Carlene Coria RN Entered By: Carlene Coria on 09/18/2019 11:01:43 -------------------------------------------------------------------------------- Compression Therapy Details Patient Name: Date of Service: Lindsay, [name] 09/18/2019 11:00 AM Medical Record [id number] Patient Account Number: [id number] Date of Birth/Sex: Treating RN: Nov 05, 1981 (38 y.o. Clearnce Sorrel Primary Care Teira Arcilla: Juluis Mire Other Clinician: Referring Akya Fiorello: Treating Daiwik Buffalo/Extender:Robson, Melanie Crazier, Zenaida Niece in Treatment:  9 Compression Therapy Performed for Wound Wound #2 Right,Medial Lower Leg Assessment: Performed By: Clinician Kela Millin, RN Compression Type: Three Layer Post Procedure Diagnosis Same as Pre-procedure Electronic Signature(s) Signed: 09/28/2019 4:32:31 PM By: Kela Millin Entered By: Kela Millin on 09/18/2019 12:13:52 -------------------------------------------------------------------------------- Encounter Discharge Information Details Patient Name: Date of Service: Lindsay Heater D. 09/18/2019 11:00 AM Medical Record [id number] Patient Account Number: [id number] Date of Birth/Sex: Treating RN: Jun 19, 1981 (38 y.o. Debby Bud Primary Care Amillion Macchia: Juluis Mire Other Clinician: Referring Zedrick Springsteen: Treating Gryffin Altice/Extender:Robson, Melanie Crazier, Zenaida Niece in Treatment: 9 Encounter Discharge Information Items Discharge Condition: Stable Ambulatory Status: Cane Discharge Destination: Home Transportation: Private Auto Accompanied By: self Schedule Follow-up Appointment: Yes Clinical Summary of Care: Electronic Signature(s) Signed: 09/18/2019 5:51:45 PM By: Deon Pilling Entered By: Deon Pilling on 09/18/2019 14:14:30 -------------------------------------------------------------------------------- Lower Extremity Assessment Details Patient Name: Date of Service: Lindsay, [name] 09/18/2019 11:00 AM Medical Record [id number] Patient Account Number: [id number] Date of Birth/Sex: Treating RN: 1981/04/06 (38 y.o. Orvan Falconer Primary Care Delson Dulworth: Juluis Mire Other Clinician: Referring Mercades Bajaj: Treating Karimah Winquist/Extender:Robson, Melanie Crazier, Zenaida Niece in Treatment: 9 Edema Assessment Assessed: [Left: No] [Right: No] Edema: [Left: Ye] [Right: s] Calf Left: Right: Point of Measurement: 39 cm From Medial Instep cm 52 cm Ankle Left: Right: Point of Measurement: 12 cm From Medial Instep cm 26 cm Electronic  Signature(s) Signed: 11/28/2019 2:58:33 PM By: Carlene Coria RN Entered By: Carlene Coria on 09/18/2019 11:08:07 -------------------------------------------------------------------------------- Multi Wound Chart Details Patient Name: Date of Service: Lindsay Heater D. 09/18/2019 11:00 AM Medical Record [id number] Patient Account Number: [id number] Date of Birth/Sex: Treating RN: Feb 11, 1981 (38 y.o. 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] [N/A:N/A] Comorbid History: [2:Lymphedema, Asthma,  Ulcer Etiology: Wound Location: Right Lower Leg - Medial Wound Open Wounding Event: Gradually Appeared Status: Date Acquired: 07/07/2019 Comorbid Lymphedema, Asthma, Congestive Heart Weeks Of Treatment: 9 History: Failure, Coronary Artery Disease, Clustered Wound: No Hypertension, Myocardial Infarction, Hepatitis B, Type II Diabetes, Neuropathy Photos Wound Measurements Length: (cm) 0.8 % Reduct Width: (cm) 0.8 % Reduct Depth: (cm) 0.1 Epitheli Area: (cm) 0.503 Tunneli Volume: (cm) 0.05 Undermi Wound Description Full Thickness Without Exposed Support Foul Od Classification: Structures Slough/ Wound Flat and Intact Margin: Exudate Medium Amount: Exudate Serosanguineous Type: Exudate red, brown Color: Wound Bed Granulation Amount: Medium (34-66%) Granulation Quality: Red, Pink Fascia Necrotic Amount: Medium (34-66%) Fat Lay Necrotic Quality: Adherent Slough Tendon Muscle Joint E Bone Ex or After Cleansing: No Fibrino Yes Exposed Structure Exposed: No er (Subcutaneous Tissue) Exposed: Yes Exposed: No Exposed: No xposed: No posed: No ion in Area: 85.4% ion in Volume: 85.5% alization: Medium (34-66%) ng: No ning: No Electronic Signature(s) Signed: 09/20/2019 8:44:12 AM By: Mikeal Hawthorne EMT/HBOT Signed: 11/28/2019 2:58:33 PM By: Carlene Coria RN Entered By: Mikeal Hawthorne on 09/19/2019 12:54:30 -------------------------------------------------------------------------------- Vitals Details Patient Name: Date of Service: Lindsay Heater D. 09/18/2019 11:00 AM Medical Record [id number] Patient Account Number: [id number] Date of Birth/Sex: Treating RN: May 13, 1981 (38 y.o. Orvan Falconer Primary Care  Tessie Ordaz: Juluis Mire Other Clinician: Referring Julyana Woolverton: Treating Caron Tardif/Extender:Robson, Melanie Crazier, Zenaida Niece in Treatment: 9 Vital Signs Time Taken: 11:01 Temperature (F): 98.4 Height (in): 67 Pulse (bpm): 74 Weight (lbs): 413 Respiratory Rate (breaths/min): 18 Body Mass Index (BMI): 64.7 Blood Pressure (mmHg): 142/77 Reference Range: 80 - 120 mg / dl Electronic Signature(s) Signed: 11/28/2019 2:58:33 PM By: Carlene Coria RN Entered By: Carlene Coria on 09/18/2019 11:05:24

## 2019-11-28 NOTE — Progress Notes (Signed)
Lindsay [name], Lindsay [name] ([id number]) Visit Report for 08/29/2019 SuperBill Details Patient Name: Date of Service: Lindsay [name], Lindsay [name] 08/29/2019 Medical Record [id number] Patient Account Number: [id number] Date of Birth/Sex: Treating RN: Mar 09, 1981 (38 y.o. 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

## 2019-12-05 ENCOUNTER — Ambulatory Visit (INDEPENDENT_AMBULATORY_CARE_PROVIDER_SITE_OTHER): Payer: Medicaid Other | Admitting: Primary Care

## 2019-12-05 ENCOUNTER — Encounter (INDEPENDENT_AMBULATORY_CARE_PROVIDER_SITE_OTHER): Payer: Self-pay | Admitting: Primary Care

## 2019-12-05 ENCOUNTER — Other Ambulatory Visit: Payer: Self-pay

## 2019-12-05 VITALS — BP 132/82 | HR 99 | Temp 97.1°F | Ht 67.5 in | Wt >= 6400 oz

## 2019-12-05 DIAGNOSIS — Z760 Encounter for issue of repeat prescription: Secondary | ICD-10-CM | POA: Diagnosis not present

## 2019-12-05 DIAGNOSIS — E118 Type 2 diabetes mellitus with unspecified complications: Secondary | ICD-10-CM

## 2019-12-05 DIAGNOSIS — T304 Corrosion of unspecified body region, unspecified degree: Secondary | ICD-10-CM | POA: Diagnosis not present

## 2019-12-05 DIAGNOSIS — I10 Essential (primary) hypertension: Secondary | ICD-10-CM

## 2019-12-05 DIAGNOSIS — J45909 Unspecified asthma, uncomplicated: Secondary | ICD-10-CM | POA: Diagnosis not present

## 2019-12-05 DIAGNOSIS — Z6841 Body Mass Index (BMI) 40.0 and over, adult: Secondary | ICD-10-CM

## 2019-12-05 DIAGNOSIS — E782 Mixed hyperlipidemia: Secondary | ICD-10-CM | POA: Diagnosis not present

## 2019-12-05 MED ORDER — ATORVASTATIN CALCIUM 80 MG PO TABS: 80.0000 mg | ORAL_TABLET | Freq: Every day | ORAL | 1 refills | Status: DC

## 2019-12-05 MED ORDER — SILVER SULFADIAZINE 1 % EX CREA: 1.0000 "application " | TOPICAL_CREAM | Freq: Two times a day (BID) | CUTANEOUS | 0 refills | Status: DC

## 2019-12-05 MED ORDER — FLOVENT HFA 110 MCG/ACT IN AERO: 2.0000 | INHALATION_SPRAY | Freq: Two times a day (BID) | RESPIRATORY_TRACT | 12 refills | Status: DC

## 2019-12-05 MED ORDER — ALBUTEROL SULFATE HFA 108 (90 BASE) MCG/ACT IN AERS: INHALATION_SPRAY | RESPIRATORY_TRACT | 2 refills | Status: DC

## 2019-12-05 MED ORDER — HYDROCHLOROTHIAZIDE 25 MG PO TABS: 25.0000 mg | ORAL_TABLET | Freq: Every morning | ORAL | 1 refills | Status: DC

## 2019-12-05 NOTE — Progress Notes (Signed)
Established Patient Office Visit  Subjective:  Patient ID: Lindsay [name], female    DOB: 10/13/1981  Age: 38 y.o. MRN: [id number]  CC:  Chief Complaint  Patient presents with  . Hypertension  . Diabetes   aspercream HPI Lindsay [name] presents for managment of hypertension - she does has shortness of breath secondary to comorbidities -morbid obesity and asthma. She denies  headaches, chest pain or lower extremity edema. Diabetes will be managed by endocrinology. She did use Aspercreme and broke out with whelps open area stopped using it  Past Medical History:  Diagnosis Date  . ARF (acute renal failure) ([place]) 04/2015  . Asthma   . Cellulitis of right upper extremity   . Coronary artery disease   . Diabetes mellitus    insulin dependent  . Hyperlipidemia LDL goal <70   . Hypertension   . NSTEMI (non-ST elevated myocardial infarction) ([place]) 08/2016  . Obesity   . S/P angioplasty with stent 08/2016   DES to mLAD and PTCA only to 2nd diag ostium.     Past Surgical History:  Procedure Laterality Date  . CARDIAC CATHETERIZATION N/A 09/07/2016   Procedure: Left Heart Cath and Coronary Angiography;  Surgeon: [name], MD;  Location: [facility];  Service: Cardiovascular;  Laterality: N/A;  . CARDIAC CATHETERIZATION N/A 09/07/2016   Procedure: Coronary Stent Intervention;  Surgeon: [name], MD;  Location: [facility];  Service: Cardiovascular;  Laterality: N/A;  . CARDIAC CATHETERIZATION N/A 09/07/2016   Procedure: Coronary Balloon Angioplasty;  Surgeon: [name], MD;  Location: [facility];  Service: Cardiovascular;  Laterality: N/A;  . CESAREAN SECTION    . IRRIGATION AND DEBRIDEMENT SHOULDER Right 04/30/2015   Procedure: IRRIGATION AND DEBRIDEMENT SHOULDER;  Surgeon: Renette Butters, MD;  Location: [place];  Service: Orthopedics;  Laterality: Right;  . IRRIGATION AND DEBRIDEMENT SHOULDER Right 05/01/2015  . LEG SURGERY    . SHOULDER  ARTHROSCOPY Right 04/30/2015   Procedure: ARTHROSCOPY SHOULDER;  Surgeon: Renette Butters, MD;  Location: [facility] [place];  Service: Orthopedics;  Laterality: Right;  . TONSILLECTOMY      Family History  Problem Relation Age of Onset  . Diabetes Mother   . Hypertension Mother   . Heart disease Mother   . Diabetes Father   . Heart disease Father   . Stroke Maternal Grandmother   . Cancer Maternal Grandmother     Social History   Socioeconomic History  . Marital status: Single    Spouse name: Not on file  . Number of children: Not on file  . Years of education: Not on file  . Highest education level: Not on file  Occupational History  . Not on file  Tobacco Use  . Smoking status: Never Smoker  . Smokeless tobacco: Never Used  Substance and Sexual Activity  . Alcohol use: Yes    Comment: socially  . Drug use: No  . Sexual activity: Not on file  Other Topics Concern  . Not on file  Social History Narrative  . Not on file   Social Determinants of Health   Financial Resource Strain:   . Difficulty of Paying Living Expenses: Not on file  Food Insecurity:   . Worried About Charity fundraiser in the Last Year: Not on file  . Ran Out of Food in the Last Year: Not on file  Transportation Needs:   . Lack of Transportation (Medical): Not on file  .  Lack of Transportation (Non-Medical): Not on file  Physical Activity:   . Days of Exercise per Week: Not on file  . Minutes of Exercise per Session: Not on file  Stress:   . Feeling of Stress : Not on file  Social Connections:   . Frequency of Communication with Friends and Family: Not on file  . Frequency of Social Gatherings with Friends and Family: Not on file  . Attends Religious Services: Not on file  . Active Member of Clubs or Organizations: Not on file  . Attends Archivist Meetings: Not on file  . Marital Status: Not on file  Intimate Partner Violence:   . Fear of Current or Ex-Partner: Not on file  . Emotionally  Abused: Not on file  . Physically Abused: Not on file  . Sexually Abused: Not on file    Outpatient Medications Prior to Visit  Medication Sig Dispense Refill  . Accu-Chek FastClix Lancets MISC USE 1 TO CHECK GLUCOSE 4 TIMES DAILY 102 each 0  . amLODipine (NORVASC) 10 MG tablet Take 1 tablet by mouth daily 90 tablet 0  . aspirin 81 MG tablet Take 1 tablet (81 mg total) by mouth daily. 30 tablet 11  . aspirin-acetaminophen-caffeine (EXCEDRIN MIGRAINE) [id number] MG tablet Take 2 tablets by mouth every 6 (six) hours as needed for headache.    . b complex vitamins tablet Take 1 tablet by mouth daily.    . baclofen (LIORESAL) 10 MG tablet Take 0.5-1 tablets (5-10 mg total) by mouth 3 (three) times daily as needed for muscle spasms. 30 tablet 1  . BD VEO INSULIN SYRINGE U/F 31G X 15/64" 1 ML MISC USE  SYRINGE ONCE DAILY 100 each 1  . Blood Glucose Monitoring Suppl (ACCU-CHEK AVIVA PLUS) w/Device KIT 1 each by Does not apply route as directed. 1 kit 0  . DULoxetine (CYMBALTA) 30 MG capsule TAKE 1 CAPSULE BY MOUTH  DAILY 90 capsule 1  . empagliflozin (JARDIANCE) 10 MG TABS tablet Take 10 mg by mouth daily. 30 tablet 3  . ezetimibe (ZETIA) 10 MG tablet Take 1 tablet (10 mg total) by mouth daily. 90 tablet 3  . gabapentin (NEURONTIN) 300 MG capsule TAKE 2 CAPSULES BY MOUTH THREE TIMES DAILY 180 capsule 0  . glucose blood (ACCU-CHEK AVIVA PLUS) test strip Use as instructed to test blood sugar 4 times daily. 100 each 12  . hydrocortisone 2.5 % ointment Apply topically for rash on face up to twice daily as needed.    Marland Kitchen ibuprofen (ADVIL,MOTRIN) 200 MG tablet Take 400 mg by mouth every 6 (six) hours as needed for fever or moderate pain.    . Insulin Glargine (LANTUS SOLOSTAR) 100 UNIT/ML Solostar Pen Inject 50 Units into the skin at bedtime. 45 mL 2  . Insulin Pen Needle (B-D UF III MINI PEN NEEDLES) 31G X 5 MM MISC Use as instructed. Monitor blood glucose levels three times per day 90 each 1  . insulin  regular human CONCENTRATED (HUMULIN R) 500 UNIT/ML injection INJECT 70 UNITS 30 MINUTES BEFORE EACH MEAL 20 mL 3  . ketoconazole (NIZORAL) 2 % shampoo Use as a shampoo at least once weekly.    Marland Kitchen liraglutide (VICTOZA) 18 MG/3ML SOPN Inject 0.21m daily x1 week then 1.241mdaily x1 week then 1.61m55maily and continue    . losartan (COZAAR) 100 MG tablet Take 1 tablet (100 mg total) by mouth daily. 90 tablet 3  . Melatonin 10 MG TABS Take 3 tablets by mouth  at bedtime. 30 tablet 2  . methocarbamol (ROBAXIN) 750 MG tablet Take 1 tablet (750 mg total) by mouth at bedtime. 30 tablet 3  . metoprolol tartrate (LOPRESSOR) 50 MG tablet Take 1 tablet (50 mg total) by mouth 2 (two) times daily. 60 tablet 11  . Multiple Vitamins-Minerals (MULTIVITAMIN PO) Take 1 tablet by mouth daily.    . mupirocin ointment (BACTROBAN) 2 % Apply twice daily to the affected area x 7 days or until healed 22 g 1  . nitroGLYCERIN (NITROSTAT) 0.4 MG SL tablet Place 1 tablet (0.4 mg total) under the tongue every 5 (five) minutes as needed for chest pain. 25 tablet 11  . albuterol (VENTOLIN HFA) 108 (90 Base) MCG/ACT inhaler INHALE 2 PUFFS BY MOUTH EVERY 6 HOURS AS NEEDED FOR WHEEZING FOR SHORTNESS OF BREATH 18 g 2  . atorvastatin (LIPITOR) 80 MG tablet Take 1 tablet (80 mg total) by mouth daily at 6 PM. Please call and schedule November/December follow up. [phone] 30 tablet 11  . hydrochlorothiazide (HYDRODIURIL) 25 MG tablet Take 1 tablet (25 mg total) by mouth every morning. 30 tablet 3   No facility-administered medications prior to visit.    Allergies  Allergen Reactions  . Hydrazine Yellow [Tartrazine] Shortness Of Breath and Swelling    Swelling mostly noticed in legs and feet, retaining urination, shortness of breaht, and minor chest pain  . Lisinopril Shortness Of Breath    Was on prinzide; had sob/chest pain on it.  . Tylenol [Acetaminophen] Itching and Swelling    Itching of the mouth, swelling of tongue and  stomach started hurting    ROS Review of Systems  Respiratory: Positive for shortness of breath.   Skin:       See picture from chemical burn use of asper cream for pain  All other systems reviewed and are negative.     Objective:    Physical Exam  Constitutional: She is oriented to person, place, and time. She appears well-developed and well-nourished.  HENT:  Head: Normocephalic.  Cardiovascular: Normal rate and regular rhythm.  Pulmonary/Chest: Effort normal and breath sounds normal.  Abdominal: Bowel sounds are normal. She exhibits distension.  Musculoskeletal:     Cervical back: Neck supple.  Neurological: She is oriented to person, place, and time.  Skin:  See note  Psychiatric: She has a normal mood and affect. Her behavior is normal. Thought content normal.    BP 132/82 (BP Location: Left Arm, Patient Position: Sitting, Cuff Size: Large)   Pulse 99   Temp (!) 97.1 F (36.2 C) (Temporal)   Ht 5' 7.5" (1.715 m)   Wt (!) 434 lb (196.9 kg)   LMP 11/10/2019 (Approximate)   SpO2 95%   BMI 66.97 kg/m  Wt Readings from Last 3 Encounters:  12/05/19 (!) 434 lb (196.9 kg)  10/05/19 (!) 416 lb 12.8 oz (189.1 kg)  08/15/19 (!) 429 lb 9.6 oz (194.9 kg)     Health Maintenance Due  Topic Date Due  . FOOT EXAM  09/28/2017  . OPHTHALMOLOGY EXAM  11/20/2017  . PAP SMEAR-Modifier  08/15/2019  . HEMOGLOBIN A1C  10/12/2019    There are no preventive care reminders to display for this patient.  Lab Results  Component Value Date   TSH 2.930 05/28/2017   Lab Results  Component Value Date   WBC 7.6 04/12/2019   HGB 13.2 04/12/2019   HCT 41.0 04/12/2019   MCV 83 04/12/2019   PLT 284 04/12/2019   Lab Results  Component Value Date   NA 139 01/31/2019   K 4.8 01/31/2019   CO2 22 01/31/2019   GLUCOSE 198 (H) 01/31/2019   BUN 15 01/31/2019   CREATININE 0.81 01/31/2019   BILITOT 0.5 01/11/2019   ALKPHOS 93 01/11/2019   AST 27 01/11/2019   ALT 32 01/11/2019    PROT 8.0 01/11/2019   ALBUMIN 3.6 01/11/2019   CALCIUM 9.6 01/31/2019   ANIONGAP 10 01/11/2019   GFR 105.56 04/12/2018   Lab Results  Component Value Date   CHOL 149 04/12/2019   Lab Results  Component Value Date   HDL 42 04/12/2019   Lab Results  Component Value Date   LDLCALC 43 04/12/2019   Lab Results  Component Value Date   TRIG 320 (H) 04/12/2019   Lab Results  Component Value Date   CHOLHDL 3.5 04/12/2019   Lab Results  Component Value Date   HGBA1C 8.4 (H) 04/12/2019      Assessment & Plan:   Problem List Items Addressed This Visit    Essential hypertension   Relevant Medications   hydrochlorothiazide (HYDRODIURIL) 25 MG tablet   atorvastatin (LIPITOR) 80 MG tablet   Morbid obesity (HCC)   Diabetes mellitus with complication (HCC)   Relevant Medications   atorvastatin (LIPITOR) 80 MG tablet   Other Relevant Orders   Glucose (CBG)    Other Visit Diagnoses    Mixed hyperlipidemia    -  Primary   Relevant Medications   hydrochlorothiazide (HYDRODIURIL) 25 MG tablet   atorvastatin (LIPITOR) 80 MG tablet   Other Relevant Orders   Lipid Panel   Medication refill       Relevant Medications   hydrochlorothiazide (HYDRODIURIL) 25 MG tablet   Uncomplicated asthma, unspecified asthma severity, unspecified whether persistent       Relevant Medications   albuterol (VENTOLIN HFA) 108 (90 Base) MCG/ACT inhaler   fluticasone (FLOVENT HFA) 110 MCG/ACT inhaler   Mild persistent asthma without complication       Relevant Medications   albuterol (VENTOLIN HFA) 108 (90 Base) MCG/ACT inhaler   fluticasone (FLOVENT HFA) 110 MCG/ACT inhaler   Chemical burn        Lindsay [name] was seen today for hypertension and diabetes.  Diagnoses and all orders for this visit:  Mixed hyperlipidemia Decrease your fatty foods, red meat, cheese, milk and increase fiber like whole grains and veggies. You can also add a fiber supplement like Metamucil or Benefiber.  -     atorvastatin  (LIPITOR) 80 MG tablet; Take 1 tablet (80 mg total) by mouth daily at 6 PM.  -     Lipid Panel  Diabetes mellitus with complication (HCC) Followed by endocrinology  -     Glucose (CBG)  Medication refill -     hydrochlorothiazide (HYDRODIURIL) 25 MG tablet; Take 1 tablet (25 mg total) by mouth every morning.  Essential hypertension Followed by cardiology but patient states yearly today Bp acceptable 132/82. Blood pressure goal  130/80, low-sodium, DASH diet, medication compliance, 150 minutes of moderate intensity exercise per week. Discussed medication compliance, adverse effects. -     hydrochlorothiazide (HYDRODIURIL) 25 MG tablet; Take 1 tablet (25 mg total) by mouth every morning.  Morbid obesity ([place]) Morbid Obesity is > 40  indicating an excess in caloric intake or underlining conditions. This may lead to other co-morbidities hypoxia, uncontrolled hypertension, diabetes and CVD. Lifestyle modifications of diet and exercise may reduce obesity.   Chemical burn    Uncomplicated asthma, unspecified asthma  severity, unspecified whether persistent -     albuterol (VENTOLIN HFA) 108 (90 Base) MCG/ACT inhaler; INHALE 2 PUFFS BY MOUTH EVERY 6 HOURS AS NEEDED FOR WHEEZING FOR SHORTNESS OF BREATH Add Fluticasone for better controlled LBA  Other orders -     fluticasone (FLOVENT HFA) 110 MCG/ACT inhaler; Inhale 2 puffs into the lungs 2 (two) times daily. -     silver sulfADIAZINE (SILVADENE) 1 % cream; Apply 1 application topically 2 (two) times daily.    Meds ordered this encounter  Medications  . hydrochlorothiazide (HYDRODIURIL) 25 MG tablet    Sig: Take 1 tablet (25 mg total) by mouth every morning.    Dispense:  90 tablet    Refill:  1  . atorvastatin (LIPITOR) 80 MG tablet    Sig: Take 1 tablet (80 mg total) by mouth daily at 6 PM. Please call and schedule November/December follow up. [phone]    Dispense:  90 tablet    Refill:  1    Please consider 90 day supplies to  promote better adherence  . albuterol (VENTOLIN HFA) 108 (90 Base) MCG/ACT inhaler    Sig: INHALE 2 PUFFS BY MOUTH EVERY 6 HOURS AS NEEDED FOR WHEEZING FOR SHORTNESS OF BREATH    Dispense:  18 g    Refill:  2  . fluticasone (FLOVENT HFA) 110 MCG/ACT inhaler    Sig: Inhale 2 puffs into the lungs 2 (two) times daily.    Dispense:  1 Inhaler    Refill:  12  . silver sulfADIAZINE (SILVADENE) 1 % cream    Sig: Apply 1 application topically 2 (two) times daily.    Dispense:  400 g    Refill:  0    Follow-up: Return in about 6 months (around 06/04/2020) for hyperlipidemia and morbid obesity.    [name], NP

## 2019-12-05 NOTE — Progress Notes (Signed)
5'7.5"  

## 2019-12-05 NOTE — Patient Instructions (Signed)
Chemical Burn, Adult  A chemical burn is an injury to the skin. The parts below the skin can also be injured. This type of burn is caused by coming into contact with a chemical that can damage and kill tissue (caustic chemical). A chemical burn can be more serious than other burns. This is because the chemical is normally on the skin for a longer time. In some cases, some chemicals keep causing damage even after they have been taken off the skin. What are the causes? This condition is caused by contact with a caustic chemical. This happens from swallowing, breathing in, touching, or being touched by the chemical. What increases the risk? A chemical burn is more likely to happen to people who are exposed to caustic chemicals at work. Chemical burns may happen in workplaces where these chemicals are used or stored. This includes in Set [employer], medicine, farming, and mining. What are the signs or symptoms? Symptoms of this condition depend on the type of chemical that caused the burn. Symptoms may keep getting worse even after the chemical has been taken away. Common symptoms include:  Color changes of the skin. Your skin may lose color, turn red, or turn darker.  Blisters.  Rash.  Dry, flaky skin.  A type of acne that happens from being exposed to certain chemicals.  Burning or aching pain.  Itching. If you breathed in a caustic chemical, symptoms can include:  Irritated eyes or nose.  Sore throat.  Coughing. Later symptoms may include scarring, shrinking of the skin, and permanent change in skin color. The chemical can be swallowed or taken into the body through a wound. This can cause damage to:  Organs, including the liver, kidneys, and bladder.  The immune system.  The nervous system. This includes the brain, spinal cord, and nerves.  The respiratory system. This includes the nose, throat, windpipe, and lungs. How is this diagnosed? This condition is diagnosed with a  medical history and physical exam. Your doctor will check how deep the burn is and how much of your skin surface it covers. A skin burn is diagnosed as one of these types:  First degree. The burn is only on the surface of the skin. This is also called a superficial burn.  Second degree. The burn goes down into the first two skin layers below the surface. This is also called a partial thickness burn.  Third degree. The burn goes through all layers of your skin. This is also called a full thickness burn. Your blood pressure, heart rate, and amount of pee (urine) you make may also be measured. If the injury is very bad, you may also have:  Blood tests to measure salts and minerals in the blood (electrolytes) and measure blood cell count.  A test to check the heart's electrical activity (electrocardiogram, or ECG).  A chest X-ray to check the lungs. How is this treated? Treatment for a chemical burn starts with taking away the caustic chemical. Your skin will be washed with water to take away the chemical. If the chemical is a powder, it will be brushed away before the skin is washed. Any clothing that has the chemical on it will also be taken off. After the chemical is taken away, other treatment may be done right away. This may include:  Oxygen to help you breathe.  Antibiotic medicine to fight infection.  Pain medicine.  Giving fluids through an IV tube.  Applying bandages (dressings).  Taking away dead tissue (debridement).  Giving a tetanus shot. Long-term burn care may include:  Breathing support. This could mean giving oxygen for a while or helping you breathe by using a machine.  Changing wound dressing often.  Antibiotics.  Surgery. This may include: ? Debridement. ? Skin grafts. ? Repairing other damaged tissues or parts. ? Exercises to improve movement and strength (physical therapy). Treatment will depend on the how bad your burn is. Follow these instructions at  home: Medicines  Use over-the-counter and prescription medicines only as told by your doctor.  If you were prescribed an antibiotic medicine, use it as told by your doctor. Do not stop using the antibiotic even if you start to feel better. Burn care  Follow instructions from your doctor about how to take care of your burn. This includes: ? How to clean your burn. ? When and how you should take off your dressing.  Check your burn every day for signs of infection. Check for: ? More redness, swelling, or pain. ? Fluid or blood. ? Warmth. ? Pus or a bad smell.  Keep the dressing dry until your doctor says it can be taken off. Do not take baths, swim, use a hot tub, or do anything that would put your burn underwater until your doctor approves. Ask your doctor if you may take showers. You may only be allowed to take sponge baths. Activity  Do any range-of-motion movements as told by your physical therapist, if this applies.  Rest as told by your doctor. Do not exercise until your doctor says it is okay. General instructions  Raise (elevate) the injured area above the level of your heart while you are sitting or lying down.  Do not scratch or pick at the burn.  Do not break any blisters you may have. Do not peel any skin.  Protect your burn from the sun.  Drink enough fluid to keep your urine pale yellow.  Keep all follow-up visits as told by your doctor. This is important. How is this prevented?  Try to avoid being around chemicals that can cause burns.  Wear gloves and equipment that protect you when you touch dangerous chemicals.  Make sure all dangerous chemicals are labeled.  Where you work, ask about which dangerous chemicals they use. Ask if safer chemicals can be used instead.  Make sure there is enough clean air in any place that has dangerous chemicals.  Keep your skin clean and moist (moisturized). Dry skin is more likely to be hurt by chemicals. Contact a doctor  if:  You got a tetanus shot and you have any of these problems in the area of the shot: ? Swelling. ? Very bad pain. ? Redness. ? Bleeding.  Your symptoms do not get better with treatment.  Medicine does not help your pain.  You have more redness, swelling, or pain around your burn.  Your burn feels warm to the touch. Get help right away if:  You have red streaks near the burn.  You have fluid, blood, or pus coming from the burn.  You have very bad swelling.  You have very bad pain.  There is a bad smell coming from your burn.  You have a fever.  You lose feeling (have numbness) or have tingling in the burned area or further down your legs or arms.  You have trouble breathing.  You are coughing or breathing loudly (wheezing).  You have chest pain. Summary  A chemical burn is an injury to your skin. The parts  below the skin can also be injured. Chemical burns can be more serious than other burns.  A chemical burn is more likely to happen to people who are exposed to caustic chemicals at work that can damage and kill tissue (caustic chemicals). Symptoms may keep getting worse even after the chemical has been taken away.  Try not to be around chemicals that can cause burns. Wear gloves and equipment that protect you when you touch dangerous chemicals. This information is not intended to replace advice given to you by your health care provider. Make sure you discuss any questions you have with your health care provider. Document Released: 01/14/2005 Document Revised: 04/04/2019 Document Reviewed: 07/31/2016 Elsevier Patient Education  2020 [employer].

## 2019-12-06 LAB — LIPID PANEL
Chol/HDL Ratio: 2.9 ratio (ref 0.0–4.4)
Cholesterol, Total: 138 mg/dL (ref 100–199)
HDL: 47 mg/dL (ref >39)
LDL Chol Calc (NIH): 67 mg/dL (ref 0–99)
Triglycerides: 134 mg/dL (ref 0–149)
VLDL Cholesterol Cal: 24 mg/dL (ref 5–40)

## 2019-12-08 ENCOUNTER — Other Ambulatory Visit (INDEPENDENT_AMBULATORY_CARE_PROVIDER_SITE_OTHER): Payer: Self-pay | Admitting: Primary Care

## 2019-12-08 DIAGNOSIS — E782 Mixed hyperlipidemia: Secondary | ICD-10-CM

## 2019-12-08 MED ORDER — ATORVASTATIN CALCIUM 40 MG PO TABS: 40.0000 mg | ORAL_TABLET | Freq: Every day | ORAL | 1 refills | Status: DC

## 2019-12-22 DIAGNOSIS — G4733 Obstructive sleep apnea (adult) (pediatric): Secondary | ICD-10-CM | POA: Diagnosis not present

## 2019-12-26 ENCOUNTER — Other Ambulatory Visit: Payer: Self-pay | Admitting: Primary Care

## 2019-12-26 DIAGNOSIS — R200 Anesthesia of skin: Secondary | ICD-10-CM

## 2019-12-26 NOTE — Telephone Encounter (Signed)
Fwd to PCP

## 2020-01-05 DIAGNOSIS — Z7689 Persons encountering health services in other specified circumstances: Secondary | ICD-10-CM | POA: Diagnosis not present

## 2020-01-05 DIAGNOSIS — H53023 Refractive amblyopia, bilateral: Secondary | ICD-10-CM | POA: Diagnosis not present

## 2020-01-05 DIAGNOSIS — H538 Other visual disturbances: Secondary | ICD-10-CM | POA: Diagnosis not present

## 2020-01-05 DIAGNOSIS — E119 Type 2 diabetes mellitus without complications: Secondary | ICD-10-CM | POA: Diagnosis not present

## 2020-01-11 DIAGNOSIS — H5213 Myopia, bilateral: Secondary | ICD-10-CM | POA: Diagnosis not present

## 2020-01-16 ENCOUNTER — Other Ambulatory Visit: Payer: Self-pay

## 2020-01-16 ENCOUNTER — Ambulatory Visit: Payer: Medicaid Other | Admitting: Podiatry

## 2020-01-16 DIAGNOSIS — Z7689 Persons encountering health services in other specified circumstances: Secondary | ICD-10-CM | POA: Diagnosis not present

## 2020-01-16 DIAGNOSIS — B351 Tinea unguium: Secondary | ICD-10-CM

## 2020-01-16 DIAGNOSIS — E1142 Type 2 diabetes mellitus with diabetic polyneuropathy: Secondary | ICD-10-CM | POA: Diagnosis not present

## 2020-01-16 DIAGNOSIS — M79674 Pain in right toe(s): Secondary | ICD-10-CM | POA: Diagnosis not present

## 2020-01-16 DIAGNOSIS — M79675 Pain in left toe(s): Secondary | ICD-10-CM

## 2020-01-16 DIAGNOSIS — L84 Corns and callosities: Secondary | ICD-10-CM

## 2020-01-16 NOTE — Patient Instructions (Addendum)
WEEKLY FOOT SOAK INSTRUCTIONS FOR FOOT HYGIENE   1. SOAK FEET IN LUKEWARM SOAPY WATER FOR 10 MINUTES. HAVE A FAMILY MEMBER OR CAREGIVER CHECK THE WATER TEMPERATURE FOR YOU BEFORE SUBMERGING YOUR FEET IN THE WATER.  2.  DRY FEET WELL TAKING CARE TO DRY WELL BETWEEN TOES AND UNDER TOES.  3.  APPLY MOISTURIZING CREAM TO FEET AVOIDING APPLICATION BETWEEN TOES    Diabetes Mellitus and Foot Care Foot care is an important part of your health, especially when you have diabetes. Diabetes may cause you to have problems because of poor blood flow (circulation) to your feet and legs, which can cause your skin to:  Become thinner and drier.  Break more easily.  Heal more slowly.  Peel and crack. You may also have nerve damage (neuropathy) in your legs and feet, causing decreased feeling in them. This means that you may not notice minor injuries to your feet that could lead to more serious problems. Noticing and addressing any potential problems early is the best way to prevent future foot problems. How to care for your feet Foot hygiene  Wash your feet daily with warm water and mild soap. Do not use hot water. Then, pat your feet and the areas between your toes until they are completely dry. Do not soak your feet as this can dry your skin.  Trim your toenails straight across. Do not dig under them or around the cuticle. File the edges of your nails with an emery board or nail file.  Apply a moisturizing lotion or petroleum jelly to the skin on your feet and to dry, brittle toenails. Use lotion that does not contain alcohol and is unscented. Do not apply lotion between your toes. Shoes and socks  Wear clean socks or stockings every day. Make sure they are not too tight. Do not wear knee-high stockings since they may decrease blood flow to your legs.  Wear shoes that fit properly and have enough cushioning. Always look in your shoes before you put them on to be sure there are no objects  inside.  To break in new shoes, wear them for just a few hours a day. This prevents injuries on your feet. Wounds, scrapes, corns, and calluses  Check your feet daily for blisters, cuts, bruises, sores, and redness. If you cannot see the bottom of your feet, use a mirror or ask someone for help.  Do not cut corns or calluses or try to remove them with medicine.  If you find a minor scrape, cut, or break in the skin on your feet, keep it and the skin around it clean and dry. You may clean these areas with mild soap and water. Do not clean the area with peroxide, alcohol, or iodine.  If you have a wound, scrape, corn, or callus on your foot, look at it several times a day to make sure it is healing and not infected. Check for: ? Redness, swelling, or pain. ? Fluid or blood. ? Warmth. ? Pus or a bad smell. General instructions  Do not cross your legs. This may decrease blood flow to your feet.  Do not use heating pads or hot water bottles on your feet. They may burn your skin. If you have lost feeling in your feet or legs, you may not know this is happening until it is too late.  Protect your feet from hot and cold by wearing shoes, such as at the beach or on hot pavement.  Schedule a complete foot   exam at least once a year (annually) or more often if you have foot problems. If you have foot problems, report any cuts, sores, or bruises to your health care provider immediately. Contact a health care provider if:  You have a medical condition that increases your risk of infection and you have any cuts, sores, or bruises on your feet.  You have an injury that is not healing.  You have redness on your legs or feet.  You feel burning or tingling in your legs or feet.  You have pain or cramps in your legs and feet.  Your legs or feet are numb.  Your feet always feel cold.  You have pain around a toenail. Get help right away if:  You have a wound, scrape, corn, or callus on your  foot and: ? You have pain, swelling, or redness that gets worse. ? You have fluid or blood coming from the wound, scrape, corn, or callus. ? Your wound, scrape, corn, or callus feels warm to the touch. ? You have pus or a bad smell coming from the wound, scrape, corn, or callus. ? You have a fever. ? You have a red line going up your leg. Summary  Check your feet every day for cuts, sores, red spots, swelling, and blisters.  Moisturize feet and legs daily.  Wear shoes that fit properly and have enough cushioning.  If you have foot problems, report any cuts, sores, or bruises to your health care provider immediately.  Schedule a complete foot exam at least once a year (annually) or more often if you have foot problems. This information is not intended to replace advice given to you by your health care provider. Make sure you discuss any questions you have with your health care provider. Document Revised: 08/30/2019 Document Reviewed: 01/08/2017 Elsevier Patient Education  2020 [employer]  

## 2020-01-19 ENCOUNTER — Encounter (HOSPITAL_BASED_OUTPATIENT_CLINIC_OR_DEPARTMENT_OTHER): Payer: Medicaid Other | Attending: Internal Medicine | Admitting: Internal Medicine

## 2020-01-19 ENCOUNTER — Other Ambulatory Visit: Payer: Self-pay

## 2020-01-19 DIAGNOSIS — I2510 Atherosclerotic heart disease of native coronary artery without angina pectoris: Secondary | ICD-10-CM | POA: Diagnosis not present

## 2020-01-19 DIAGNOSIS — Z833 Family history of diabetes mellitus: Secondary | ICD-10-CM | POA: Insufficient documentation

## 2020-01-19 DIAGNOSIS — Z8249 Family history of ischemic heart disease and other diseases of the circulatory system: Secondary | ICD-10-CM | POA: Diagnosis not present

## 2020-01-19 DIAGNOSIS — L97212 Non-pressure chronic ulcer of right calf with fat layer exposed: Secondary | ICD-10-CM | POA: Diagnosis not present

## 2020-01-19 DIAGNOSIS — Z886 Allergy status to analgesic agent status: Secondary | ICD-10-CM | POA: Insufficient documentation

## 2020-01-19 DIAGNOSIS — S81801A Unspecified open wound, right lower leg, initial encounter: Secondary | ICD-10-CM | POA: Diagnosis not present

## 2020-01-19 DIAGNOSIS — Z79899 Other long term (current) drug therapy: Secondary | ICD-10-CM | POA: Diagnosis not present

## 2020-01-19 DIAGNOSIS — I252 Old myocardial infarction: Secondary | ICD-10-CM | POA: Insufficient documentation

## 2020-01-19 DIAGNOSIS — Z888 Allergy status to other drugs, medicaments and biological substances status: Secondary | ICD-10-CM | POA: Insufficient documentation

## 2020-01-19 DIAGNOSIS — B1910 Unspecified viral hepatitis B without hepatic coma: Secondary | ICD-10-CM | POA: Insufficient documentation

## 2020-01-19 DIAGNOSIS — I890 Lymphedema, not elsewhere classified: Secondary | ICD-10-CM | POA: Diagnosis not present

## 2020-01-19 DIAGNOSIS — Z794 Long term (current) use of insulin: Secondary | ICD-10-CM | POA: Diagnosis not present

## 2020-01-19 DIAGNOSIS — J45909 Unspecified asthma, uncomplicated: Secondary | ICD-10-CM | POA: Diagnosis not present

## 2020-01-19 DIAGNOSIS — I509 Heart failure, unspecified: Secondary | ICD-10-CM | POA: Insufficient documentation

## 2020-01-19 DIAGNOSIS — E1140 Type 2 diabetes mellitus with diabetic neuropathy, unspecified: Secondary | ICD-10-CM | POA: Diagnosis not present

## 2020-01-19 DIAGNOSIS — I110 Hypertensive heart disease with heart failure: Secondary | ICD-10-CM | POA: Insufficient documentation

## 2020-01-19 DIAGNOSIS — Z6841 Body Mass Index (BMI) 40.0 and over, adult: Secondary | ICD-10-CM | POA: Insufficient documentation

## 2020-01-20 ENCOUNTER — Encounter: Payer: Self-pay | Admitting: Podiatry

## 2020-01-20 NOTE — Progress Notes (Signed)
Subjective: Lindsay [name] presents today for follow up of preventative diabetic foot care with and painful mycotic nails b/l.  Pain interferes with ambulation. Aggravating factors include wearing enclosed shoe gear. Pt also presents with callus(es) right foot which interfere with ambulation.   Allergies  Allergen Reactions  . Hydrazine Yellow [Tartrazine] Shortness Of Breath and Swelling    Swelling mostly noticed in legs and feet, retaining urination, shortness of breaht, and minor chest pain  . Lisinopril Shortness Of Breath    Was on prinzide; had sob/chest pain on it.  . Tylenol [Acetaminophen] Itching and Swelling    Itching of the mouth, swelling of tongue and stomach started hurting     Objective: There were no vitals filed for this visit.  Vascular Examination:  Capillary fill time to digits <3s b/l, palpable DP pulses b/l, faintly palpable PT pulses b/l, pedal hair present b/l, skin temperature gradient within normal limits b/l and nonpitting edema noted b/l LE  Dermatological Examination: Pedal skin with normal turgor, texture and tone bilaterally, no open wounds bilaterally, no interdigital macerations bilaterally, toenails 1-5 b/l elongated, dystrophic, thickened, crumbly with subungual debris and hyperkeratotic lesion(s) submet head 1 right foot.  No erythema, no edema, no drainage, no flocculence.  Evidence of poor pedal hygiene b/l.  Musculoskeletal: Normal muscle strength 5/5 to all lower extremity muscle groups bilaterally, no gross bony deformities bilaterally and no pain crepitus or joint limitation noted with ROM b/l  Neurological: Protective sensation decreased with 10 gram monofilament b/l  Assessment: 1. Pain due to onychomycosis of toenails of both feet   2. Callus   3. Diabetic peripheral neuropathy associated with type 2 diabetes mellitus (HCC)    Plan: -Continue diabetic foot care principles. Literature dispensed on today.  -Toenails 1-5 b/l were  debrided in length and girth without iatrogenic bleeding. -Callus  debrided without complication or incident. Total number debrided =1 submet head 1 left foot. -Prescribed once weekly foot soaks for pedal hygiene -Patient to continue soft, supportive shoe gear daily. -Patient to report any pedal injuries to medical professional immediately. -Patient/POA to call should there be question/concern in the interim.  Return in about 3 months (around 04/15/2020) for diabetic nail trim.

## 2020-01-22 DIAGNOSIS — G4733 Obstructive sleep apnea (adult) (pediatric): Secondary | ICD-10-CM | POA: Diagnosis not present

## 2020-01-23 NOTE — Progress Notes (Signed)
difficulty arising from chair, 0 No head down, impaired balance) Mental Status Oriented to own ability 0 No Overestimates or forgets limitations 0 No Risk Level: Low Risk Score: 0 Electronic Signature(s) Signed: 01/23/2020 5:23:18 PM By: Carlene Coria RN Entered By: Carlene Coria on 01/19/2020 15:32:47 -------------------------------------------------------------------------------- Foot Assessment Details Patient Name: Date of Service: [name] 01/19/2020 2:45 PM Medical Record [id number] Patient Account Number: [id number] Date of Birth/Sex: Treating RN: Jan 22, 1981 (39 y.o. Female) Carlene Coria Primary Care Layaan Mott: Juluis Mire Other Clinician: Referring Zeb Rawl: Treating Josilynn Losh/Extender:Robson, Melanie Crazier, Zenaida Niece in Treatment: 0 Foot Assessment Items Site Locations + = Sensation present, - = Sensation absent, C = Callus, U = Ulcer R = Redness, W = Warmth, M = Maceration, PU = Pre-ulcerative lesion F = Fissure, S = Swelling, D = Dryness Assessment Right: Left: Other Deformity: No No Prior Foot Ulcer: No No Prior Amputation: No No Charcot Joint: No No Ambulatory Status: Ambulatory Without Help Gait:  Steady Electronic Signature(s) Signed: 01/23/2020 5:23:18 PM By: Carlene Coria RN Entered By: Carlene Coria on 01/19/2020 15:39:50 -------------------------------------------------------------------------------- Nutrition Risk Screening Details Patient Name: Date of Service: KATAYA, GUIMONT 01/19/2020 2:45 PM Medical Record [id number] Patient Account Number: [id number] Date of Birth/Sex: Treating RN: 07/19/1981 (39 y.o. Female) Carlene Coria Primary Care Darothy Courtright: Juluis Mire Other Clinician: Referring Ellington Greenslade: Treating Kayn Haymore/Extender:Robson, Melanie Crazier, Zenaida Niece in Treatment: 0 Height (in): 67 Weight (lbs): 445 Body Mass Index (BMI): 69.7 Nutrition Risk Screening Items Score Screening NUTRITION RISK SCREEN: I have an illness or condition that made me change the kind and/or 0 No amount of food I eat I eat fewer than two meals per day 0 No I eat few fruits and vegetables, or milk products 0 No I have three or more drinks of beer, liquor or wine almost every day 0 No I have tooth or mouth problems that make it hard for me to eat 0 No I don't always have enough money to buy the food I need 0 No I eat alone most of the time 0 No I take three or more different prescribed or over-the-counter drugs a day 1 Yes 0 No Without wanting to, I have lost or gained 10 pounds in the last six months I am not always physically able to shop, cook and/or feed myself 0 No Nutrition Protocols Good Risk Protocol 0 No interventions needed Moderate Risk Protocol High Risk Proctocol Risk Level: Good Risk Score: 1 Electronic Signature(s) Signed: 01/23/2020 5:23:18 PM By: Carlene Coria RN Entered By: Carlene Coria on 01/19/2020 15:33:01  MARICELA, SCHREUR ([id number]) Visit Report for 01/19/2020 Abuse/Suicide Risk Screen Details Patient Name: Date of Service: LARONDA, LISBY 01/19/2020 2:45 PM Medical Record [id number] Patient Account Number: [id number] Date of Birth/Sex: Treating RN: 01/18/1981 (39 y.o. Female) Yevonne Pax Primary Care Mattelyn Imhoff: Gwinda Passe Other Clinician: Referring Shuree Brossart: Treating Kalon Erhardt/Extender:Robson, Waymon Budge, Casey Burkitt in Treatment: 0 Abuse/Suicide Risk Screen Items Answer ABUSE RISK SCREEN: Has anyone close to you tried to hurt or harm you recentlyo No Do you feel uncomfortable with anyone in your familyo No Has anyone forced you do things that you didnt want to doo No Electronic Signature(s) Signed: 01/23/2020 5:23:18 PM By: Yevonne Pax RN Entered By: Yevonne Pax on 01/19/2020 15:31:45 -------------------------------------------------------------------------------- Activities of Daily Living Details Patient Name: Date of Service: LANETT, LASORSA 01/19/2020 2:45 PM Medical Record [id number] Patient Account Number: [id number] Date of Birth/Sex: Treating RN: 1981-03-21 (39 y.o. Female) Yevonne Pax Primary Care Lalaine Overstreet: Gwinda Passe Other Clinician: Referring Marykatherine Sherwood: Treating Lillan Mccreadie/Extender:Robson, Waymon Budge, Casey Burkitt in Treatment: 0 Activities of Daily Living Items Answer Activities of Daily Living (Please select one for each item) Drive Automobile Completely Able Take Medications Completely Able Use Telephone Completely Able Care for Appearance Completely Able Use Toilet Completely Able Bath / Shower Completely Able Dress Self Completely Able Feed Self Completely Able Walk Completely Able Get In / Out Bed Completely Able Housework Completely Able Prepare Meals Completely Able Handle Money Completely Able Shop for Self Completely Able Electronic Signature(s) Signed: 01/23/2020 5:23:18 PM By: Yevonne Pax  RN Entered By: Yevonne Pax on 01/19/2020 15:32:12 -------------------------------------------------------------------------------- Education Screening Details Patient Name: Date of Service: [name] 01/19/2020 2:45 PM Medical Record [id number] Patient Account Number: [id number] Date of Birth/Sex: Treating RN: Sep 12, 1981 (39 y.o. Female) Yevonne Pax Primary Care Salomon Ganser: Gwinda Passe Other Clinician: Referring Alexus Galka: Treating Fidencio Duddy/Extender:Robson, Waymon Budge, Casey Burkitt in Treatment: 0 Primary Learner Assessed: Patient Learning Preferences/Education Level/Primary Language Learning Preference: Explanation Highest Education Level: College or Above Preferred Language: English Cognitive Barrier Language Barrier: No Translator Needed: No Memory Deficit: No Emotional Barrier: No Cultural/Religious Beliefs Affecting Medical Care: No Physical Barrier Impaired Vision: No Impaired Hearing: No Decreased Hand dexterity: No Knowledge/Comprehension Knowledge Level: High Comprehension Level: High Ability to understand written High instructions: Ability to understand verbal High instructions: Motivation Anxiety Level: Calm Cooperation: Cooperative Education Importance: Acknowledges Need Interest in Health Problems: Asks Questions Perception: Coherent Willingness to Engage in Self- High Management Activities: Readiness to Engage in Self- High Management Activities: Electronic Signature(s) Signed: 01/23/2020 5:23:18 PM By: Yevonne Pax RN Entered By: Yevonne Pax on 01/19/2020 15:32:37 -------------------------------------------------------------------------------- Fall Risk Assessment Details Patient Name: Date of Service: [name] 01/19/2020 2:45 PM Medical Record [id number] Patient Account Number: [id number] Date of Birth/Sex: Treating RN: Nov 01, 1981 (39 y.o. Female) Yevonne Pax Primary Care Theodoros Stjames: Gwinda Passe Other Clinician: Referring Chauncy Mangiaracina: Treating Zurri Rudden/Extender:Robson, Waymon Budge, Casey Burkitt in Treatment: 0 Fall Risk Assessment Items Have you had 2 or more falls in the last 12 monthso 0 No Have you had any fall that resulted in injury in the last 12 monthso 0 No FALLS RISK SCREEN History of falling - immediate or within 3 months 0 No Secondary diagnosis (Do you have 2 or more medical diagnoseso) 0 No Ambulatory aid None/bed rest/wheelchair/nurse 0 No Crutches/cane/walker 0 No Furniture 0 No Intravenous therapy Access/Saline/Heparin Lock 0 No Weak (short steps with or without shuffle, stooped but able to lift head 0 No while walking, may seek support from furniture) Impaired (short steps with shuffle, may have

## 2020-01-23 NOTE — Progress Notes (Signed)
Treatment Notes Wound #3 (Right Lower Leg) 1. Cleanse With Wound Cleanser Soap and water 2. Periwound Care Moisturizing lotion TCA Cream 3. Primary Dressing Applied Calcium Alginate Ag 4. Secondary Dressing Dry Gauze 6. Support Layer Applied 4 layer compression wrap Notes unna boot first layer applied at upper portion of lower leg to aid in securing compression wrap. netting. Electronic Signature(s) Signed: 01/19/2020 5:55:42 PM By: Linton Ham MD Entered By: Linton Ham on 01/19/2020  16:49:26 -------------------------------------------------------------------------------- Multi-Disciplinary Care Plan Details Patient Name: Date of Service: Lindsay Heater D. 01/19/2020 2:45 PM Medical Record [id number] Patient Account Number: [id number] Date of Birth/Sex: Treating RN: Jan 02, 1981 (39 y.o. Female) Kela Millin Primary Care Wolfe Camarena: Juluis Mire Other Clinician: Referring Jagger Beahm: Treating Rector Devonshire/Extender:Robson, Melanie Crazier, Zenaida Niece in Treatment: 0 Active Inactive Nutrition Nursing Diagnoses: Imbalanced nutrition Goals: Patient/caregiver will maintain therapeutic glucose control Date Initiated: 01/19/2020 Target Resolution Date: 02/16/2020 Goal Status: Active Interventions: Provide education on elevated blood sugars and impact on wound healing Notes: Wound/Skin Impairment Nursing Diagnoses: Impaired tissue integrity Goals: Ulcer/skin breakdown will have a volume reduction of 30% by week 4 Date Initiated: 01/19/2020 Target Resolution Date: 02/16/2020 Goal Status: Active Interventions: Provide education on ulcer and skin care Notes: Electronic Signature(s) Signed: 01/19/2020 5:49:40 PM By: Kela Millin Entered By: Kela Millin on 01/19/2020 16:30:36 -------------------------------------------------------------------------------- Pain Assessment Details Patient Name: Date of Service: Lindsay [name], Lindsay [name] 01/19/2020 2:45 PM Medical Record [id number] Patient Account Number: [id number] Date of Birth/Sex: Treating RN: 1981-09-22 (39 y.o. Female) Carlene Coria Primary Care Ryshawn Sanzone: Juluis Mire Other Clinician: Referring Adalie Mand: Treating Jamorris Ndiaye/Extender:Robson, Melanie Crazier, Zenaida Niece in Treatment: 0 Active Problems Location of Pain Severity and Description of Pain Patient Has Paino Yes Site Locations With Dressing Change: Yes With Dressing Change: Yes Duration of the Pain. Constant /  Intermittento Constant Rate the pain. Current Pain Level: 5 Worst Pain Level: 8 Least Pain Level: 3 Tolerable Pain Level: 5 Character of Pain Describe the Pain: Aching, Throbbing Pain Management and Medication Current Pain Management: Medication: Yes Cold Application: No Rest: Yes Massage: No Activity: No T.E.N.S.: No Heat Application: No Leg drop or elevation: No Is the Current Pain Management Adequate: Inadequate How does your wound impact your activities of daily livingo Sleep: Yes Bathing: No Appetite: Yes Relationship With Others: No Bladder Continence: No Emotions: No Bowel Continence: No Work: No Toileting: No Drive: No Dressing: No Hobbies: No Electronic Signature(s) Signed: 01/23/2020 5:23:18 PM By: Carlene Coria RN Entered By: Carlene Coria on 01/19/2020 16:02:02 -------------------------------------------------------------------------------- Patient/Caregiver Education Details Patient Name: Lindsay [name] 1/29/2021andnbsp2:45 Date of Service: PM Medical Record [id number] Number: Patient Account Number: [id number] Treating RN: Date of Birth/Gender: 03/26/1981 (38 y.o. Kela Millin Female) Other Clinician: Primary Care Treating Artist Beach Physician: Physician/Extender: Referring Physician: Aaron Mose in Treatment: 0 Education Assessment Education Provided To: Patient Education Topics Provided Elevated Blood Sugar/ Impact on Healing: Methods: Explain/Verbal Responses: State content correctly Wound/Skin Impairment: Methods: Explain/Verbal Responses: State content correctly Electronic Signature(s) Signed: 01/19/2020 5:49:40 PM By: Kela Millin Entered By: Kela Millin on 01/19/2020 16:30:47 -------------------------------------------------------------------------------- Wound Assessment Details Patient Name: Date of Service: Lindsay Heater D. 01/19/2020 2:45 PM Medical Record [id number]  Patient Account Number: [id number] Date of Birth/Sex: Treating RN: [birth year] (39 y.o. Female) Primary Care Sigismund Cross: Juluis Mire Other Clinician: Referring Julieana Eshleman: Treating Victorine Mcnee/Extender:Robson, Melanie Crazier, Zenaida Niece in Treatment: 0 Wound Status Wound Number: 3 Primary Lymphedema Etiology: Wound Location: Right Lower Leg Wound Open Wounding Event: Gradually Appeared Status: Date Acquired: 01/11/2020 Comorbid Lymphedema, Asthma, Congestive Heart Weeks  or less) 0 []  - Complex Staple / Suture removal (26 or more) 0 []  - Hypo/Hyperglycemic Management (do not check if billed separately) 0 []  - Ankle / Brachial Index (ABI) - do not check if billed separately 0 Has the patient been seen at the hospital within the last three years: Yes Total Score: 85 Level Of Care: New/Established - Level 3 Electronic Signature(s) Signed: 01/19/2020 5:49:40 PM By: Entered By: on 01/19/2020 16:44:00 -------------------------------------------------------------------------------- Compression Therapy Details Patient Name: Date of Service: Lindsay [name] 01/19/2020 2:45 PM Medical Record 01/21/2020 Patient Account Number: [name] Date of Birth/Sex: 1981/02/26 (38 y.o. Female) Treating RN: [id number] Primary Care Kalah Pflum: 09/13/1981 Other Clinician: Referring Ulmer Degen: Treating Lynnzie Blackson/Extender:Robson, 06-17-1985, Lindsay [name] in Treatment: 0 Compression Therapy Performed for Wound Wound #3 Right Lower Leg Assessment: Performed By: Clinician Gwinda Passe, RN Compression Type: Four Layer Post Procedure Diagnosis Same as Pre-procedure Electronic Signature(s) Signed: 01/19/2020 5:49:40 PM By: Casey Burkitt Entered By: Shawn Stall on 01/19/2020 16:33:57 -------------------------------------------------------------------------------- Encounter Discharge Information Details Patient Name: Date of Service: Lindsay [name] D. 01/19/2020 2:45 PM Medical Record 01/21/2020 Patient Account Number: Lindsay Fritz Date of  Birth/Sex: Treating RN: [birth year] (39 y.o. Female) [id number] Primary Care Gary Gabrielsen: 09/13/1981 Other Clinician: Referring Brogan Martis: Treating Larosa Rhines/Extender:Robson, 20, Shawn Stall in Treatment: 0 Encounter Discharge Information Items Discharge Condition: Stable Ambulatory Status: Cane Discharge Destination: Home Transportation: Private Auto Accompanied By: self Schedule Follow-up Appointment: Yes Clinical Summary of Care: Electronic Signature(s) Signed: 01/19/2020 6:00:37 PM By: Waymon Budge Entered By: Casey Burkitt on 01/19/2020 16:49:17 -------------------------------------------------------------------------------- Lower Extremity Assessment Details Patient Name: Date of Service: Lindsay [name], Lindsay [name] 01/19/2020 2:45 PM Medical Record 01/21/2020 Patient Account Number: [name] Date of Birth/Sex: Treating RN: 01-09-1981 (39 y.o. Female) [id number] Primary Care Chrishelle Zito: 09/13/1981 Other Clinician: Referring Gerlene Glassburn: Treating Reighlynn Swiney/Extender:Robson, 20, Lindsay Pax in Treatment: 0 Edema Assessment Assessed: [Left: No] [Right: No] E[Left: dema] [Right: :] Calf Left: Right: Point of Measurement: 44 cm From Medial Instep cm 45 cm Ankle Left: Right: Point of Measurement: 12 cm From Medial Instep cm 26 cm Electronic Signature(s) Signed: 01/23/2020 5:23:18 PM By: Waymon Budge RN Entered By: Casey Burkitt on 01/19/2020 15:58:42 -------------------------------------------------------------------------------- Multi Wound Chart Details Patient Name: Date of Service: Lindsay Pax D. 01/19/2020 2:45 PM Medical Record 01/21/2020 Patient Account Number: Lindsay Fritz Date of Birth/Sex: Treating RN: [birth year] (39 y.o. Female) Primary Care Mattias Walmsley: [id number] Other Clinician: Referring Brevyn Ring: Treating Chriselda Leppert/Extender:Robson, 09/13/1981, 20 in Treatment: 0 Vital Signs Height(in):  67 Pulse(bpm): 93 Weight(lbs): 445 Blood Pressure(mmHg): 135/86 Body Mass Index(BMI): 70 Temperature(F): 98.3 Respiratory 18 Rate(breaths/min): Photos: [3:No Photos] [N/A:N/A] Wound Location: [3:Right Lower Leg] [N/A:N/A] Wounding Event: [3:Gradually Appeared] [N/A:N/A] Primary Etiology: [3:Dehisced Wound] [N/A:N/A] Comorbid History: [3:Lymphedema, Asthma, Congestive Heart Failure, Coronary Artery Disease, Hypertension, Myocardial Infarction, Hepatitis B, Type II Diabetes, Neuropathy] [N/A:N/A] Date Acquired: [3:01/11/2020] [N/A:N/A] Weeks of Treatment: [3:0] [N/A:N/A] Wound Status: [3:Open] [N/A:N/A] Measurements L x W x D 1x0.8x0.1 [N/A:N/A] (cm) Area (cm) : [3:0.628] [N/A:N/A] Volume (cm) : [3:0.063] [N/A:N/A] % Reduction in Area: [3:0.00%] [N/A:N/A] % Reduction in Volume: 0.00% [N/A:N/A] Classification: [3:Full Thickness Without Exposed Support Structures] [N/A:N/A] Exudate Amount: [3:Medium] [N/A:N/A] Exudate Type: [3:Serosanguineous] [N/A:N/A] Exudate Color: [3:red, brown] [N/A:N/A] Wound Margin: [3:Flat and Intact] [N/A:N/A] Granulation Amount: [3:Large (67-100%)] [N/A:N/A] Granulation Quality: [3:Red] [N/A:N/A] Necrotic Amount: [3:None Present (0%)] [N/A:N/A] Exposed Structures: [3:Fat Layer (Subcutaneous Tissue) Exposed: Yes Fascia: No Tendon: No Muscle: No Joint: No Bone: No] [N/A:N/A] Epithelialization: [3:None Compression Therapy] [N/A:N/A N/A]  Of Treatment: 0 History: Failure, Coronary Artery Disease, Clustered Wound: No Hypertension, Myocardial Infarction, Hepatitis B, Type II Diabetes, Neuropathy Photos Wound Measurements Length: (cm) 1 % Reduct Width: (cm) 0.8 % Reduct Depth: (cm) 0.1 Epitheli Area: (cm) 0.628 Tunneli Volume: (cm) 0.063 Undermi Wound Description Classification: Full Thickness Without Exposed Support Fou Structures Slo Wound Flat and Intact Margin: Exudate Medium Amount: Exudate Serosanguineous Type: Exudate red, brown Color: Wound Bed Granulation Amount: Large (67-100%) Granulation Quality: Red Fasc Necrotic Amount: None Present (0%) Fat Tend Musc Join Bone l Odor After Cleansing: No ugh/Fibrino No Exposed Structure ia Exposed: No Layer (Subcutaneous Tissue) Exposed: Yes on Exposed: No le Exposed: No t Exposed: No Exposed: No ion in Area: 0% ion in Volume: 0% alization: None ng: No ning: No Treatment Notes Wound #3 (Right Lower Leg) 1. Cleanse With Wound Cleanser Soap and water 2. Periwound Care Moisturizing lotion TCA Cream 3. Primary Dressing Applied Calcium Alginate Ag 4. Secondary Dressing Dry Gauze 6. Support Layer Applied 4 layer compression wrap Notes unna boot first layer applied at upper portion of lower leg to aid in securing compression wrap. netting. Electronic Signature(s) Signed: 01/23/2020 5:19:10 PM By: Benjaman Kindler EMT/HBOT Entered By: Benjaman Kindler on 01/23/2020  13:24:28 -------------------------------------------------------------------------------- Vitals Details Patient Name: Date of Service: Lindsay Fritz D. 01/19/2020 2:45 PM Medical Record [id number] Patient Account Number: [id number] Date of Birth/Sex: Treating RN: [birth year] (39 y.o. Female) Lindsay Pax Primary Care Diem Pagnotta: Gwinda Passe Other Clinician: Referring Persephone Schriever: Treating Kaevon Cotta/Extender:Robson, Waymon Budge, Casey Burkitt in Treatment: 0 Vital Signs Time Taken: 15:29 Temperature (F): 98.3 Height (in): 67 Pulse (bpm): 93 Source: Stated Respiratory Rate (breaths/min): 18 Weight (lbs): 445 Blood Pressure (mmHg): 135/86 Body Mass Index (BMI): 69.7 Reference Range: 80 - 120 mg / dl Electronic Signature(s) Signed: 01/23/2020 5:23:18 PM By: Lindsay Pax RN Entered By: Lindsay Pax on 01/19/2020 15:29:47  Of Treatment: 0 History: Failure, Coronary Artery Disease, Clustered Wound: No Hypertension, Myocardial Infarction, Hepatitis B, Type II Diabetes, Neuropathy Photos Wound Measurements Length: (cm) 1 % Reduct Width: (cm) 0.8 % Reduct Depth: (cm) 0.1 Epitheli Area: (cm) 0.628 Tunneli Volume: (cm) 0.063 Undermi Wound Description Classification: Full Thickness Without Exposed Support Fou Structures Slo Wound Flat and Intact Margin: Exudate Medium Amount: Exudate Serosanguineous Type: Exudate red, brown Color: Wound Bed Granulation Amount: Large (67-100%) Granulation Quality: Red Fasc Necrotic Amount: None Present (0%) Fat Tend Musc Join Bone l Odor After Cleansing: No ugh/Fibrino No Exposed Structure ia Exposed: No Layer (Subcutaneous Tissue) Exposed: Yes on Exposed: No le Exposed: No t Exposed: No Exposed: No ion in Area: 0% ion in Volume: 0% alization: None ng: No ning: No Treatment Notes Wound #3 (Right Lower Leg) 1. Cleanse With Wound Cleanser Soap and water 2. Periwound Care Moisturizing lotion TCA Cream 3. Primary Dressing Applied Calcium Alginate Ag 4. Secondary Dressing Dry Gauze 6. Support Layer Applied 4 layer compression wrap Notes unna boot first layer applied at upper portion of lower leg to aid in securing compression wrap. netting. Electronic Signature(s) Signed: 01/23/2020 5:19:10 PM By: Benjaman Kindler EMT/HBOT Entered By: Benjaman Kindler on 01/23/2020  13:24:28 -------------------------------------------------------------------------------- Vitals Details Patient Name: Date of Service: Lindsay Fritz D. 01/19/2020 2:45 PM Medical Record [id number] Patient Account Number: [id number] Date of Birth/Sex: Treating RN: [birth year] (39 y.o. Female) Lindsay Pax Primary Care Diem Pagnotta: Gwinda Passe Other Clinician: Referring Persephone Schriever: Treating Kaevon Cotta/Extender:Robson, Waymon Budge, Casey Burkitt in Treatment: 0 Vital Signs Time Taken: 15:29 Temperature (F): 98.3 Height (in): 67 Pulse (bpm): 93 Source: Stated Respiratory Rate (breaths/min): 18 Weight (lbs): 445 Blood Pressure (mmHg): 135/86 Body Mass Index (BMI): 69.7 Reference Range: 80 - 120 mg / dl Electronic Signature(s) Signed: 01/23/2020 5:23:18 PM By: Lindsay Pax RN Entered By: Lindsay Pax on 01/19/2020 15:29:47

## 2020-01-23 NOTE — Progress Notes (Signed)
Number: [id number] Date of Birth/Sex: Treating RN: 1981/06/07 (39 y.o. Female) Primary Care Provider: Gwinda Passe Other Clinician: Referring Provider: Treating Provider/Extender:Avrian Delfavero, Waymon Budge, Casey Burkitt in Treatment: 0 Constitutional Sitting or standing Blood Pressure is within target range for patient.. Pulse regular and within target range for patient.Marland Kitchen Respirations regular, non-labored and within target  range.. Temperature is normal and within the target range for the patient.Marland Kitchen Appears in no distress. Eyes Conjunctivae clear. No discharge.no icterus. Respiratory work of breathing is normal. Cardiovascular pedal pulses palpable. Edema non pitting. Integumentary (Hair, Skin) some discoloration arouing the wound. Psychiatric appears at normal baseline. Notes Wound Exam; the patient has a small wound on the right medial calf. This looks fairly benign. Even under illumination it did not have a dangerous looking surface. No debridement was required some flaking dry skin from the circumference washed off with Anasept and gauze. There is no subsidence of this wound nothing looks particularly worrisome. She does have surrounding erythema that looks like venous stasis. Nonpitting edema but no evidence of cellulitis Electronic Signature(s) Signed: 01/19/2020 5:55:42 PM By: Baltazar Najjar MD Entered By: Baltazar Najjar on 01/19/2020 17:03:43 -------------------------------------------------------------------------------- Physician Orders Details Patient Name: Date of Service: Lindsay [name] D. 01/19/2020 2:45 PM Medical Record [id number] Patient Account Number: [id number] Date of Birth/Sex: Treating RN: 1981/01/21 (39 y.o. Female) Cherylin Mylar Primary Care Provider: Gwinda Passe Other Clinician: Referring Provider: Treating Provider/Extender:Ryka Beighley, Waymon Budge, Casey Burkitt in Treatment: 0 Verbal / Phone Orders: No Diagnosis Coding Follow-up Appointments Return Appointment in 1 week. - Friday Dressing Change Frequency Do not change entire dressing for one week. Skin Barriers/Peri-Wound Care Moisturizing lotion TCA Cream or Ointment - localized around wound Wound Cleansing May shower with protection. - cast protector Primary Wound Dressing Wound #3 Right Lower Leg Calcium Alginate with Silver Secondary Dressing Wound #3 Right Lower Leg Dry Gauze ABD  pad Edema Control 4 layer compression - Right Lower Extremity Avoid standing for long periods of time Elevate legs to the level of the heart or above for 30 minutes daily and/or when sitting, a frequency of: Support Garment 20-30 mm/Hg pressure to: - left leg Consults Lower Extremity Arterial Duplex - Bilateral Lower extremity Arterial Duplex, ABI's and TBI's Electronic Signature(s) Signed: 01/23/2020 4:57:44 PM By: Cherylin Mylar Signed: 01/23/2020 5:22:39 PM By: Baltazar Najjar MD Previous Signature: 01/19/2020 5:49:40 PM Version By: Cherylin Mylar Previous Signature: 01/19/2020 5:55:42 PM Version By: Baltazar Najjar MD Entered By: Cherylin Mylar on 01/23/2020 07:09:35 -------------------------------------------------------------------------------- Problem List Details Patient Name: Date of Service: Lindsay [name] D. 01/19/2020 2:45 PM Medical Record [id number] Patient Account Number: [id number] Date of Birth/Sex: Treating RN: 05/01/1981 (39 y.o. Female) Primary Care Provider: Gwinda Passe Other Clinician: Referring Provider: Treating Provider/Extender:Ja Ohman, Waymon Budge, Casey Burkitt in Treatment: 0 Active Problems ICD-10 Evaluated Encounter Code Description Active Date Today Diagnosis I89.0 Lymphedema, not elsewhere classified 01/19/2020 No Yes L97.212 Non-pressure chronic ulcer of right calf with fat layer 01/19/2020 No Yes exposed Inactive Problems Resolved Problems Electronic Signature(s) Signed: 01/19/2020 5:55:42 PM By: Baltazar Najjar MD Entered By: Baltazar Najjar on 01/19/2020 16:49:19 -------------------------------------------------------------------------------- Progress Note Details Patient Name: Date of Service: Lindsay [name] D. 01/19/2020 2:45 PM Medical Record [id number] Patient Account Number: [id number] Date of Birth/Sex: Treating RN: 27-Feb-1981 (39 y.o. Female) Primary Care Provider: Gwinda Passe Other  Clinician: Referring Provider: Treating Provider/Extender:Jonel Weldon, Waymon Budge, Casey Burkitt in Treatment: 0 Subjective History of Present Illness (HPI) ADMISSION 02/20/2019 This is a 39 year old woman with type 2 diabetes and peripheral neuropathy. She is here for review of  Number: [id number] Date of Birth/Sex: Treating RN: 1981/06/07 (39 y.o. Female) Primary Care Provider: Gwinda Passe Other Clinician: Referring Provider: Treating Provider/Extender:Avrian Delfavero, Waymon Budge, Casey Burkitt in Treatment: 0 Constitutional Sitting or standing Blood Pressure is within target range for patient.. Pulse regular and within target range for patient.Marland Kitchen Respirations regular, non-labored and within target  range.. Temperature is normal and within the target range for the patient.Marland Kitchen Appears in no distress. Eyes Conjunctivae clear. No discharge.no icterus. Respiratory work of breathing is normal. Cardiovascular pedal pulses palpable. Edema non pitting. Integumentary (Hair, Skin) some discoloration arouing the wound. Psychiatric appears at normal baseline. Notes Wound Exam; the patient has a small wound on the right medial calf. This looks fairly benign. Even under illumination it did not have a dangerous looking surface. No debridement was required some flaking dry skin from the circumference washed off with Anasept and gauze. There is no subsidence of this wound nothing looks particularly worrisome. She does have surrounding erythema that looks like venous stasis. Nonpitting edema but no evidence of cellulitis Electronic Signature(s) Signed: 01/19/2020 5:55:42 PM By: Baltazar Najjar MD Entered By: Baltazar Najjar on 01/19/2020 17:03:43 -------------------------------------------------------------------------------- Physician Orders Details Patient Name: Date of Service: Lindsay [name] D. 01/19/2020 2:45 PM Medical Record [id number] Patient Account Number: [id number] Date of Birth/Sex: Treating RN: 1981/01/21 (39 y.o. Female) Cherylin Mylar Primary Care Provider: Gwinda Passe Other Clinician: Referring Provider: Treating Provider/Extender:Ryka Beighley, Waymon Budge, Casey Burkitt in Treatment: 0 Verbal / Phone Orders: No Diagnosis Coding Follow-up Appointments Return Appointment in 1 week. - Friday Dressing Change Frequency Do not change entire dressing for one week. Skin Barriers/Peri-Wound Care Moisturizing lotion TCA Cream or Ointment - localized around wound Wound Cleansing May shower with protection. - cast protector Primary Wound Dressing Wound #3 Right Lower Leg Calcium Alginate with Silver Secondary Dressing Wound #3 Right Lower Leg Dry Gauze ABD  pad Edema Control 4 layer compression - Right Lower Extremity Avoid standing for long periods of time Elevate legs to the level of the heart or above for 30 minutes daily and/or when sitting, a frequency of: Support Garment 20-30 mm/Hg pressure to: - left leg Consults Lower Extremity Arterial Duplex - Bilateral Lower extremity Arterial Duplex, ABI's and TBI's Electronic Signature(s) Signed: 01/23/2020 4:57:44 PM By: Cherylin Mylar Signed: 01/23/2020 5:22:39 PM By: Baltazar Najjar MD Previous Signature: 01/19/2020 5:49:40 PM Version By: Cherylin Mylar Previous Signature: 01/19/2020 5:55:42 PM Version By: Baltazar Najjar MD Entered By: Cherylin Mylar on 01/23/2020 07:09:35 -------------------------------------------------------------------------------- Problem List Details Patient Name: Date of Service: Lindsay [name] D. 01/19/2020 2:45 PM Medical Record [id number] Patient Account Number: [id number] Date of Birth/Sex: Treating RN: 05/01/1981 (39 y.o. Female) Primary Care Provider: Gwinda Passe Other Clinician: Referring Provider: Treating Provider/Extender:Ja Ohman, Waymon Budge, Casey Burkitt in Treatment: 0 Active Problems ICD-10 Evaluated Encounter Code Description Active Date Today Diagnosis I89.0 Lymphedema, not elsewhere classified 01/19/2020 No Yes L97.212 Non-pressure chronic ulcer of right calf with fat layer 01/19/2020 No Yes exposed Inactive Problems Resolved Problems Electronic Signature(s) Signed: 01/19/2020 5:55:42 PM By: Baltazar Najjar MD Entered By: Baltazar Najjar on 01/19/2020 16:49:19 -------------------------------------------------------------------------------- Progress Note Details Patient Name: Date of Service: Lindsay [name] D. 01/19/2020 2:45 PM Medical Record [id number] Patient Account Number: [id number] Date of Birth/Sex: Treating RN: 27-Feb-1981 (39 y.o. Female) Primary Care Provider: Gwinda Passe Other  Clinician: Referring Provider: Treating Provider/Extender:Jonel Weldon, Waymon Budge, Casey Burkitt in Treatment: 0 Subjective History of Present Illness (HPI) ADMISSION 02/20/2019 This is a 39 year old woman with type 2 diabetes and peripheral neuropathy. She is here for review of  in 1 week. - Friday Dressing Change Frequency: Do not change entire dressing for one week. Skin Barriers/Peri-Wound Care: Moisturizing lotion TCA Cream or Ointment - localized around wound Wound Cleansing: May shower with protection. - cast protector Primary Wound Dressing: Wound #3 Right Lower Leg: Calcium Alginate with Silver Secondary Dressing: Wound #3 Right Lower Leg: Dry Gauze ABD pad Edema Control: 4 layer compression - Right Lower Extremity Avoid standing for long periods of time Elevate legs to the level of the heart or above for 30 minutes daily and/or when sitting, a frequency of: Support Garment 20-30 mm/Hg pressure to: - left leg 1. Ongoing use silver alginate under 4-layer compression 2. I have called these venous insufficiency wounds in the past although her venous reflux studies were fairly unremarkable. 3. I have not felt that she had significant arterial disease based on her palpable pulses however she has recurrent wounds in her lower extremities and I think formal arterial studies are certainly indicated at this point 4. In terms of causation I think that she probably was not wearing adequate compression from the stocking she had. We are certainly going to have to make sure that she has better quality and increased compression [at least 20/30] 5. I am always bothered when people are listed as having venous reflux ulcers and then they turn out to have no reflux on formal reflux studies. I think this is largely lymphedema related. There is nothing about the wound  that looks particularly ominous in terms of an additional causation. Particularly I do not think a biopsy is necessary Electronic Signature(s) Signed: 01/19/2020 5:55:42 PM By: Baltazar Najjar MD Entered By: Baltazar Najjar on 01/19/2020 17:06:07 -------------------------------------------------------------------------------- HxROS Details Patient Name: Date of Service: Lindsay [name] D. 01/19/2020 2:45 PM Medical Record [id number] Patient Account Number: [id number] Date of Birth/Sex: Treating RN: 04/23/1981 (39 y.o. Female) Yevonne Pax Primary Care Provider: Gwinda Passe Other Clinician: Referring Provider: Treating Provider/Extender:Zayn Selley, Waymon Budge, Casey Burkitt in Treatment: 0 Information Obtained From Patient Constitutional Symptoms (General Health) Complaints and Symptoms: Negative for: Fatigue; Fever; Chills; Marked Weight Change Eyes Complaints and Symptoms: Negative for: Dry Eyes; Vision Changes; Glasses / Contacts Medical History: Negative for: Cataracts; Glaucoma; Optic Neuritis Ear/Nose/Mouth/Throat Complaints and Symptoms: Negative for: Chronic sinus problems or rhinitis Medical History: Negative for: Chronic sinus problems/congestion; Middle ear problems Respiratory Complaints and Symptoms: Negative for: Chronic or frequent coughs; Shortness of Breath Medical History: Positive for: Asthma Negative for: Aspiration; Chronic Obstructive Pulmonary Disease (COPD); Pneumothorax; Sleep Apnea; Tuberculosis Cardiovascular Complaints and Symptoms: Negative for: Chest pain Medical History: Positive for: Congestive Heart Failure; Coronary Artery Disease; Hypertension; Myocardial Infarction Negative for: Angina; Arrhythmia; Deep Vein Thrombosis; Hypotension; Peripheral Arterial Disease; Peripheral Venous Disease; Phlebitis; Vasculitis Gastrointestinal Complaints and Symptoms: Negative for: Frequent diarrhea; Nausea; Vomiting Medical  History: Positive for: Hepatitis B - b Negative for: Cirrhosis ; Colitis; Crohns; Hepatitis A; Hepatitis C Endocrine Complaints and Symptoms: Negative for: Heat/cold intolerance Medical History: Positive for: Type II Diabetes - 15 years Negative for: Type I Diabetes Time with diabetes: 15 years Treated with: Insulin, Oral agents Genitourinary Complaints and Symptoms: Negative for: Frequent urination Medical History: Negative for: End Stage Renal Disease Integumentary (Skin) Complaints and Symptoms: Positive for: Wounds Medical History: Negative for: History of Burn Musculoskeletal Complaints and Symptoms: Negative for: Muscle Pain; Muscle Weakness Medical History: Negative for: Gout; Rheumatoid Arthritis; Osteoarthritis; Osteomyelitis Neurologic Complaints and Symptoms: Negative for: Numbness/parasthesias Medical History: Positive for: Neuropathy Negative for: Dementia; Quadriplegia; Paraplegia; Seizure Disorder Psychiatric Complaints and Symptoms:  in 1 week. - Friday Dressing Change Frequency: Do not change entire dressing for one week. Skin Barriers/Peri-Wound Care: Moisturizing lotion TCA Cream or Ointment - localized around wound Wound Cleansing: May shower with protection. - cast protector Primary Wound Dressing: Wound #3 Right Lower Leg: Calcium Alginate with Silver Secondary Dressing: Wound #3 Right Lower Leg: Dry Gauze ABD pad Edema Control: 4 layer compression - Right Lower Extremity Avoid standing for long periods of time Elevate legs to the level of the heart or above for 30 minutes daily and/or when sitting, a frequency of: Support Garment 20-30 mm/Hg pressure to: - left leg 1. Ongoing use silver alginate under 4-layer compression 2. I have called these venous insufficiency wounds in the past although her venous reflux studies were fairly unremarkable. 3. I have not felt that she had significant arterial disease based on her palpable pulses however she has recurrent wounds in her lower extremities and I think formal arterial studies are certainly indicated at this point 4. In terms of causation I think that she probably was not wearing adequate compression from the stocking she had. We are certainly going to have to make sure that she has better quality and increased compression [at least 20/30] 5. I am always bothered when people are listed as having venous reflux ulcers and then they turn out to have no reflux on formal reflux studies. I think this is largely lymphedema related. There is nothing about the wound  that looks particularly ominous in terms of an additional causation. Particularly I do not think a biopsy is necessary Electronic Signature(s) Signed: 01/19/2020 5:55:42 PM By: Baltazar Najjar MD Entered By: Baltazar Najjar on 01/19/2020 17:06:07 -------------------------------------------------------------------------------- HxROS Details Patient Name: Date of Service: Lindsay [name] D. 01/19/2020 2:45 PM Medical Record [id number] Patient Account Number: [id number] Date of Birth/Sex: Treating RN: 04/23/1981 (39 y.o. Female) Yevonne Pax Primary Care Provider: Gwinda Passe Other Clinician: Referring Provider: Treating Provider/Extender:Zayn Selley, Waymon Budge, Casey Burkitt in Treatment: 0 Information Obtained From Patient Constitutional Symptoms (General Health) Complaints and Symptoms: Negative for: Fatigue; Fever; Chills; Marked Weight Change Eyes Complaints and Symptoms: Negative for: Dry Eyes; Vision Changes; Glasses / Contacts Medical History: Negative for: Cataracts; Glaucoma; Optic Neuritis Ear/Nose/Mouth/Throat Complaints and Symptoms: Negative for: Chronic sinus problems or rhinitis Medical History: Negative for: Chronic sinus problems/congestion; Middle ear problems Respiratory Complaints and Symptoms: Negative for: Chronic or frequent coughs; Shortness of Breath Medical History: Positive for: Asthma Negative for: Aspiration; Chronic Obstructive Pulmonary Disease (COPD); Pneumothorax; Sleep Apnea; Tuberculosis Cardiovascular Complaints and Symptoms: Negative for: Chest pain Medical History: Positive for: Congestive Heart Failure; Coronary Artery Disease; Hypertension; Myocardial Infarction Negative for: Angina; Arrhythmia; Deep Vein Thrombosis; Hypotension; Peripheral Arterial Disease; Peripheral Venous Disease; Phlebitis; Vasculitis Gastrointestinal Complaints and Symptoms: Negative for: Frequent diarrhea; Nausea; Vomiting Medical  History: Positive for: Hepatitis B - b Negative for: Cirrhosis ; Colitis; Crohns; Hepatitis A; Hepatitis C Endocrine Complaints and Symptoms: Negative for: Heat/cold intolerance Medical History: Positive for: Type II Diabetes - 15 years Negative for: Type I Diabetes Time with diabetes: 15 years Treated with: Insulin, Oral agents Genitourinary Complaints and Symptoms: Negative for: Frequent urination Medical History: Negative for: End Stage Renal Disease Integumentary (Skin) Complaints and Symptoms: Positive for: Wounds Medical History: Negative for: History of Burn Musculoskeletal Complaints and Symptoms: Negative for: Muscle Pain; Muscle Weakness Medical History: Negative for: Gout; Rheumatoid Arthritis; Osteoarthritis; Osteomyelitis Neurologic Complaints and Symptoms: Negative for: Numbness/parasthesias Medical History: Positive for: Neuropathy Negative for: Dementia; Quadriplegia; Paraplegia; Seizure Disorder Psychiatric Complaints and Symptoms:  Negative for: Claustrophobia; Suicidal Medical History: Negative for: Anorexia/bulimia; Confinement Anxiety Hematologic/Lymphatic Medical History: Positive for: Lymphedema Negative for: Anemia; Hemophilia; Human Immunodeficiency Virus; Sickle Cell Disease Immunological Medical History: Negative for: Lupus Erythematosus; Raynauds; Scleroderma Oncologic Medical History: Negative for: Received Chemotherapy; Received Radiation Immunizations Pneumococcal Vaccine: Received Pneumococcal Vaccination: No Implantable Devices None Family and Social History Cancer: No; Diabetes: Yes - Maternal Grandparents,Mother,Father; Heart Disease: Yes - Maternal Grandparents,Paternal Grandparents,Mother,Father; Hereditary Spherocytosis: No; Hypertension: Yes - Maternal Grandparents,Mother,Father; Kidney Disease: Yes - Father; Lung Disease: Yes - Father; Seizures: No; Stroke: Yes - Paternal Grandparents,Mother,Father; Thyroid Problems: No;  Tuberculosis: Yes - Mother,Siblings; Never smoker; Marital Status - Single; Alcohol Use: Rarely; Drug Use: No History; Caffeine Use: Daily; Financial Concerns: No; Food, Clothing or Shelter Needs: No; Support System Lacking: No; Transportation Concerns: No Electronic Signature(s) Signed: 01/19/2020 5:55:42 PM By: Baltazar Najjar MD Signed: 01/23/2020 5:23:18 PM By: Yevonne Pax RN Entered By: Yevonne Pax on 01/19/2020 15:31:35 -------------------------------------------------------------------------------- SuperBill Details Patient Name: Date of Service: [name] 01/19/2020 Medical Record [id number] Patient Account Number: [id number] Date of Birth/Sex: Treating RN: 1981/07/09 (39 y.o. Female) Cherylin Mylar Primary Care Provider: Gwinda Passe Other Clinician: Referring Provider: Treating Provider/Extender:Karna Abed, Waymon Budge, Casey Burkitt in Treatment: 0 Diagnosis Coding ICD-10 Codes Code Description I89.0 Lymphedema, not elsewhere classified L97.212 Non-pressure chronic ulcer of right calf with fat layer exposed Facility Procedures CPT4 Code Description: [id number] 99213 - WOUND CARE VISIT-LEV 3 EST PT Modifier: 25 Quantity: 1 CPT4 Code Description: [id number] (Facility Use Only) [phone] - APPLY MULTLAY COMPRS LWR RT LEG Modifier: Quantity: 1 Physician Procedures CPT4 Code: [id number] Description: 99214 - WC PHYS LEVEL 4 - EST PT ICD-10 Diagnosis Description I89.0 Lymphedema, not elsewhere classified L97.212 Non-pressure chronic ulcer of right calf with fat lay Modifier: er exposed Quantity: 1 Electronic Signature(s) Signed: 01/19/2020 5:55:42 PM By: Baltazar Najjar MD Entered By: Baltazar Najjar on 01/19/2020 17:06:34  in 1 week. - Friday Dressing Change Frequency: Do not change entire dressing for one week. Skin Barriers/Peri-Wound Care: Moisturizing lotion TCA Cream or Ointment - localized around wound Wound Cleansing: May shower with protection. - cast protector Primary Wound Dressing: Wound #3 Right Lower Leg: Calcium Alginate with Silver Secondary Dressing: Wound #3 Right Lower Leg: Dry Gauze ABD pad Edema Control: 4 layer compression - Right Lower Extremity Avoid standing for long periods of time Elevate legs to the level of the heart or above for 30 minutes daily and/or when sitting, a frequency of: Support Garment 20-30 mm/Hg pressure to: - left leg 1. Ongoing use silver alginate under 4-layer compression 2. I have called these venous insufficiency wounds in the past although her venous reflux studies were fairly unremarkable. 3. I have not felt that she had significant arterial disease based on her palpable pulses however she has recurrent wounds in her lower extremities and I think formal arterial studies are certainly indicated at this point 4. In terms of causation I think that she probably was not wearing adequate compression from the stocking she had. We are certainly going to have to make sure that she has better quality and increased compression [at least 20/30] 5. I am always bothered when people are listed as having venous reflux ulcers and then they turn out to have no reflux on formal reflux studies. I think this is largely lymphedema related. There is nothing about the wound  that looks particularly ominous in terms of an additional causation. Particularly I do not think a biopsy is necessary Electronic Signature(s) Signed: 01/19/2020 5:55:42 PM By: Baltazar Najjar MD Entered By: Baltazar Najjar on 01/19/2020 17:06:07 -------------------------------------------------------------------------------- HxROS Details Patient Name: Date of Service: Lindsay [name] D. 01/19/2020 2:45 PM Medical Record [id number] Patient Account Number: [id number] Date of Birth/Sex: Treating RN: 04/23/1981 (39 y.o. Female) Yevonne Pax Primary Care Provider: Gwinda Passe Other Clinician: Referring Provider: Treating Provider/Extender:Zayn Selley, Waymon Budge, Casey Burkitt in Treatment: 0 Information Obtained From Patient Constitutional Symptoms (General Health) Complaints and Symptoms: Negative for: Fatigue; Fever; Chills; Marked Weight Change Eyes Complaints and Symptoms: Negative for: Dry Eyes; Vision Changes; Glasses / Contacts Medical History: Negative for: Cataracts; Glaucoma; Optic Neuritis Ear/Nose/Mouth/Throat Complaints and Symptoms: Negative for: Chronic sinus problems or rhinitis Medical History: Negative for: Chronic sinus problems/congestion; Middle ear problems Respiratory Complaints and Symptoms: Negative for: Chronic or frequent coughs; Shortness of Breath Medical History: Positive for: Asthma Negative for: Aspiration; Chronic Obstructive Pulmonary Disease (COPD); Pneumothorax; Sleep Apnea; Tuberculosis Cardiovascular Complaints and Symptoms: Negative for: Chest pain Medical History: Positive for: Congestive Heart Failure; Coronary Artery Disease; Hypertension; Myocardial Infarction Negative for: Angina; Arrhythmia; Deep Vein Thrombosis; Hypotension; Peripheral Arterial Disease; Peripheral Venous Disease; Phlebitis; Vasculitis Gastrointestinal Complaints and Symptoms: Negative for: Frequent diarrhea; Nausea; Vomiting Medical  History: Positive for: Hepatitis B - b Negative for: Cirrhosis ; Colitis; Crohns; Hepatitis A; Hepatitis C Endocrine Complaints and Symptoms: Negative for: Heat/cold intolerance Medical History: Positive for: Type II Diabetes - 15 years Negative for: Type I Diabetes Time with diabetes: 15 years Treated with: Insulin, Oral agents Genitourinary Complaints and Symptoms: Negative for: Frequent urination Medical History: Negative for: End Stage Renal Disease Integumentary (Skin) Complaints and Symptoms: Positive for: Wounds Medical History: Negative for: History of Burn Musculoskeletal Complaints and Symptoms: Negative for: Muscle Pain; Muscle Weakness Medical History: Negative for: Gout; Rheumatoid Arthritis; Osteoarthritis; Osteomyelitis Neurologic Complaints and Symptoms: Negative for: Numbness/parasthesias Medical History: Positive for: Neuropathy Negative for: Dementia; Quadriplegia; Paraplegia; Seizure Disorder Psychiatric Complaints and Symptoms:  Number: [id number] Date of Birth/Sex: Treating RN: 1981/06/07 (39 y.o. Female) Primary Care Provider: Gwinda Passe Other Clinician: Referring Provider: Treating Provider/Extender:Avrian Delfavero, Waymon Budge, Casey Burkitt in Treatment: 0 Constitutional Sitting or standing Blood Pressure is within target range for patient.. Pulse regular and within target range for patient.Marland Kitchen Respirations regular, non-labored and within target  range.. Temperature is normal and within the target range for the patient.Marland Kitchen Appears in no distress. Eyes Conjunctivae clear. No discharge.no icterus. Respiratory work of breathing is normal. Cardiovascular pedal pulses palpable. Edema non pitting. Integumentary (Hair, Skin) some discoloration arouing the wound. Psychiatric appears at normal baseline. Notes Wound Exam; the patient has a small wound on the right medial calf. This looks fairly benign. Even under illumination it did not have a dangerous looking surface. No debridement was required some flaking dry skin from the circumference washed off with Anasept and gauze. There is no subsidence of this wound nothing looks particularly worrisome. She does have surrounding erythema that looks like venous stasis. Nonpitting edema but no evidence of cellulitis Electronic Signature(s) Signed: 01/19/2020 5:55:42 PM By: Baltazar Najjar MD Entered By: Baltazar Najjar on 01/19/2020 17:03:43 -------------------------------------------------------------------------------- Physician Orders Details Patient Name: Date of Service: Lindsay [name] D. 01/19/2020 2:45 PM Medical Record [id number] Patient Account Number: [id number] Date of Birth/Sex: Treating RN: 1981/01/21 (39 y.o. Female) Cherylin Mylar Primary Care Provider: Gwinda Passe Other Clinician: Referring Provider: Treating Provider/Extender:Ryka Beighley, Waymon Budge, Casey Burkitt in Treatment: 0 Verbal / Phone Orders: No Diagnosis Coding Follow-up Appointments Return Appointment in 1 week. - Friday Dressing Change Frequency Do not change entire dressing for one week. Skin Barriers/Peri-Wound Care Moisturizing lotion TCA Cream or Ointment - localized around wound Wound Cleansing May shower with protection. - cast protector Primary Wound Dressing Wound #3 Right Lower Leg Calcium Alginate with Silver Secondary Dressing Wound #3 Right Lower Leg Dry Gauze ABD  pad Edema Control 4 layer compression - Right Lower Extremity Avoid standing for long periods of time Elevate legs to the level of the heart or above for 30 minutes daily and/or when sitting, a frequency of: Support Garment 20-30 mm/Hg pressure to: - left leg Consults Lower Extremity Arterial Duplex - Bilateral Lower extremity Arterial Duplex, ABI's and TBI's Electronic Signature(s) Signed: 01/23/2020 4:57:44 PM By: Cherylin Mylar Signed: 01/23/2020 5:22:39 PM By: Baltazar Najjar MD Previous Signature: 01/19/2020 5:49:40 PM Version By: Cherylin Mylar Previous Signature: 01/19/2020 5:55:42 PM Version By: Baltazar Najjar MD Entered By: Cherylin Mylar on 01/23/2020 07:09:35 -------------------------------------------------------------------------------- Problem List Details Patient Name: Date of Service: Lindsay [name] D. 01/19/2020 2:45 PM Medical Record [id number] Patient Account Number: [id number] Date of Birth/Sex: Treating RN: 05/01/1981 (39 y.o. Female) Primary Care Provider: Gwinda Passe Other Clinician: Referring Provider: Treating Provider/Extender:Ja Ohman, Waymon Budge, Casey Burkitt in Treatment: 0 Active Problems ICD-10 Evaluated Encounter Code Description Active Date Today Diagnosis I89.0 Lymphedema, not elsewhere classified 01/19/2020 No Yes L97.212 Non-pressure chronic ulcer of right calf with fat layer 01/19/2020 No Yes exposed Inactive Problems Resolved Problems Electronic Signature(s) Signed: 01/19/2020 5:55:42 PM By: Baltazar Najjar MD Entered By: Baltazar Najjar on 01/19/2020 16:49:19 -------------------------------------------------------------------------------- Progress Note Details Patient Name: Date of Service: Lindsay [name] D. 01/19/2020 2:45 PM Medical Record [id number] Patient Account Number: [id number] Date of Birth/Sex: Treating RN: 27-Feb-1981 (39 y.o. Female) Primary Care Provider: Gwinda Passe Other  Clinician: Referring Provider: Treating Provider/Extender:Jonel Weldon, Waymon Budge, Casey Burkitt in Treatment: 0 Subjective History of Present Illness (HPI) ADMISSION 02/20/2019 This is a 39 year old woman with type 2 diabetes and peripheral neuropathy. She is here for review of  Number: [id number] Date of Birth/Sex: Treating RN: 1981/06/07 (39 y.o. Female) Primary Care Provider: Gwinda Passe Other Clinician: Referring Provider: Treating Provider/Extender:Avrian Delfavero, Waymon Budge, Casey Burkitt in Treatment: 0 Constitutional Sitting or standing Blood Pressure is within target range for patient.. Pulse regular and within target range for patient.Marland Kitchen Respirations regular, non-labored and within target  range.. Temperature is normal and within the target range for the patient.Marland Kitchen Appears in no distress. Eyes Conjunctivae clear. No discharge.no icterus. Respiratory work of breathing is normal. Cardiovascular pedal pulses palpable. Edema non pitting. Integumentary (Hair, Skin) some discoloration arouing the wound. Psychiatric appears at normal baseline. Notes Wound Exam; the patient has a small wound on the right medial calf. This looks fairly benign. Even under illumination it did not have a dangerous looking surface. No debridement was required some flaking dry skin from the circumference washed off with Anasept and gauze. There is no subsidence of this wound nothing looks particularly worrisome. She does have surrounding erythema that looks like venous stasis. Nonpitting edema but no evidence of cellulitis Electronic Signature(s) Signed: 01/19/2020 5:55:42 PM By: Baltazar Najjar MD Entered By: Baltazar Najjar on 01/19/2020 17:03:43 -------------------------------------------------------------------------------- Physician Orders Details Patient Name: Date of Service: Lindsay [name] D. 01/19/2020 2:45 PM Medical Record [id number] Patient Account Number: [id number] Date of Birth/Sex: Treating RN: 1981/01/21 (39 y.o. Female) Cherylin Mylar Primary Care Provider: Gwinda Passe Other Clinician: Referring Provider: Treating Provider/Extender:Ryka Beighley, Waymon Budge, Casey Burkitt in Treatment: 0 Verbal / Phone Orders: No Diagnosis Coding Follow-up Appointments Return Appointment in 1 week. - Friday Dressing Change Frequency Do not change entire dressing for one week. Skin Barriers/Peri-Wound Care Moisturizing lotion TCA Cream or Ointment - localized around wound Wound Cleansing May shower with protection. - cast protector Primary Wound Dressing Wound #3 Right Lower Leg Calcium Alginate with Silver Secondary Dressing Wound #3 Right Lower Leg Dry Gauze ABD  pad Edema Control 4 layer compression - Right Lower Extremity Avoid standing for long periods of time Elevate legs to the level of the heart or above for 30 minutes daily and/or when sitting, a frequency of: Support Garment 20-30 mm/Hg pressure to: - left leg Consults Lower Extremity Arterial Duplex - Bilateral Lower extremity Arterial Duplex, ABI's and TBI's Electronic Signature(s) Signed: 01/23/2020 4:57:44 PM By: Cherylin Mylar Signed: 01/23/2020 5:22:39 PM By: Baltazar Najjar MD Previous Signature: 01/19/2020 5:49:40 PM Version By: Cherylin Mylar Previous Signature: 01/19/2020 5:55:42 PM Version By: Baltazar Najjar MD Entered By: Cherylin Mylar on 01/23/2020 07:09:35 -------------------------------------------------------------------------------- Problem List Details Patient Name: Date of Service: Lindsay [name] D. 01/19/2020 2:45 PM Medical Record [id number] Patient Account Number: [id number] Date of Birth/Sex: Treating RN: 05/01/1981 (39 y.o. Female) Primary Care Provider: Gwinda Passe Other Clinician: Referring Provider: Treating Provider/Extender:Ja Ohman, Waymon Budge, Casey Burkitt in Treatment: 0 Active Problems ICD-10 Evaluated Encounter Code Description Active Date Today Diagnosis I89.0 Lymphedema, not elsewhere classified 01/19/2020 No Yes L97.212 Non-pressure chronic ulcer of right calf with fat layer 01/19/2020 No Yes exposed Inactive Problems Resolved Problems Electronic Signature(s) Signed: 01/19/2020 5:55:42 PM By: Baltazar Najjar MD Entered By: Baltazar Najjar on 01/19/2020 16:49:19 -------------------------------------------------------------------------------- Progress Note Details Patient Name: Date of Service: Lindsay [name] D. 01/19/2020 2:45 PM Medical Record [id number] Patient Account Number: [id number] Date of Birth/Sex: Treating RN: 27-Feb-1981 (39 y.o. Female) Primary Care Provider: Gwinda Passe Other  Clinician: Referring Provider: Treating Provider/Extender:Jonel Weldon, Waymon Budge, Casey Burkitt in Treatment: 0 Subjective History of Present Illness (HPI) ADMISSION 02/20/2019 This is a 39 year old woman with type 2 diabetes and peripheral neuropathy. She is here for review of

## 2020-01-26 ENCOUNTER — Other Ambulatory Visit: Payer: Self-pay

## 2020-01-26 ENCOUNTER — Other Ambulatory Visit: Payer: Self-pay | Admitting: Family Medicine

## 2020-01-26 ENCOUNTER — Other Ambulatory Visit (INDEPENDENT_AMBULATORY_CARE_PROVIDER_SITE_OTHER): Payer: Self-pay | Admitting: Nurse Practitioner

## 2020-01-26 ENCOUNTER — Encounter (HOSPITAL_BASED_OUTPATIENT_CLINIC_OR_DEPARTMENT_OTHER): Payer: Medicaid Other | Attending: Internal Medicine

## 2020-01-26 DIAGNOSIS — L97212 Non-pressure chronic ulcer of right calf with fat layer exposed: Secondary | ICD-10-CM | POA: Insufficient documentation

## 2020-01-26 DIAGNOSIS — Z7689 Persons encountering health services in other specified circumstances: Secondary | ICD-10-CM | POA: Diagnosis not present

## 2020-01-26 DIAGNOSIS — I890 Lymphedema, not elsewhere classified: Secondary | ICD-10-CM | POA: Diagnosis not present

## 2020-01-26 DIAGNOSIS — E1165 Type 2 diabetes mellitus with hyperglycemia: Secondary | ICD-10-CM

## 2020-01-26 DIAGNOSIS — E118 Type 2 diabetes mellitus with unspecified complications: Secondary | ICD-10-CM

## 2020-01-26 DIAGNOSIS — Z9119 Patient's noncompliance with other medical treatment and regimen: Secondary | ICD-10-CM | POA: Insufficient documentation

## 2020-01-26 MED ORDER — BACLOFEN 10 MG PO TABS: 10.0000 mg | ORAL_TABLET | Freq: Three times a day (TID) | ORAL | 3 refills | Status: DC | PRN

## 2020-01-26 NOTE — Progress Notes (Signed)
AVAYA, GRABOSKI ([id number]) Visit Report for 01/26/2020 Arrival Information Details Patient Name: Date of Service: Lindsay [name], Lindsay [name] 01/26/2020 7:30 AM Medical Record [id number] Patient Account Number: [id number] Date of Birth/Sex: Treating RN: 1981-02-05 (39 y.o. Debara Pickett, Millard.Loa Primary Care Kippy Melena: Gwinda Passe Other Clinician: Referring Undra Trembath: Treating Charese Abundis/Extender:Robson, Waymon Budge, Casey Burkitt in Treatment: 1 Visit Information History Since Last Visit Cane Added or deleted any medications: No Patient Arrived: 07:52 Any new allergies or adverse reactions: No Arrival Time: Had a fall or experienced change in No Accompanied By: self None activities of daily living that may affect Transfer Assistance: risk of falls: Patient Identification Verified: Yes Signs or symptoms of abuse/neglect since last No Secondary Verification Process Completed: Yes visito Patient Requires Transmission-Based No Hospitalized since last visit: No Precautions: Implantable device outside of the clinic excluding No Patient Has Alerts: No cellular tissue based products placed in the center since last visit: Has Dressing in Place as Prescribed: Yes Has Compression in Place as Prescribed: Yes Pain Present Now: Yes Electronic Signature(s) Signed: 01/26/2020 5:54:34 PM By: Shawn Stall Entered By: Shawn Stall on 01/26/2020 07:53:16 -------------------------------------------------------------------------------- Compression Therapy Details Patient Name: Date of Service: Lindsay, [name] 01/26/2020 7:30 AM Medical Record [id number] Patient Account Number: [id number] Date of Birth/Sex: Treating RN: May 18, 1981 (39 y.o. Arta Silence Primary Care Cheresa Siers: Gwinda Passe Other Clinician: Referring Yanina Knupp: Treating Myangel Summons/Extender:Robson, Waymon Budge, Casey Burkitt in Treatment: 1 Compression Therapy Performed for Wound Wound #3 Right Lower  Leg Assessment: Performed By: Clinician Shawn Stall, RN Compression Type: Four Layer Electronic Signature(s) Signed: 01/26/2020 5:54:34 PM By: Shawn Stall Entered By: Shawn Stall on 01/26/2020 07:55:29 -------------------------------------------------------------------------------- Encounter Discharge Information Details Patient Name: Date of Service: Lindsay Fritz D. 01/26/2020 7:30 AM Medical Record [id number] Patient Account Number: [id number] Date of Birth/Sex: Treating RN: [birth year] (39 y.o. Arta Silence Primary Care Cuthbert Turton: Gwinda Passe Other Clinician: Referring Manveer Gomes: Treating Arnol Mcgibbon/Extender:Robson, Waymon Budge, Casey Burkitt in Treatment: 1 Encounter Discharge Information Items Discharge Condition: Stable Ambulatory Status: Cane Discharge Destination: Home Transportation: Private Auto Accompanied By: self Schedule Follow-up Appointment: Yes Clinical Summary of Care: Electronic Signature(s) Signed: 01/26/2020 5:54:34 PM By: Shawn Stall Entered By: Shawn Stall on 01/26/2020 08:09:09 -------------------------------------------------------------------------------- Pain Assessment Details Patient Name: Date of Service: Lindsay, [name] 01/26/2020 7:30 AM Medical Record [id number] Patient Account Number: [id number] Date of Birth/Sex: Treating RN: Oct 24, 1981 (39 y.o. Arta Silence Primary Care Anarie Kalish: Gwinda Passe Other Clinician: Referring Linkon Siverson: Treating Sherian Valenza/Extender:Robson, Waymon Budge, Casey Burkitt in Treatment: 1 Active Problems Location of Pain Severity and Description of Pain Patient Has Paino Yes Site Locations Pain Location: Pain in Ulcers Rate the pain. Current Pain Level: 6 Worst Pain Level: 10 Least Pain Level: 0 Tolerable Pain Level: 8 Pain Management and Medication Current Pain Management: Medication: Yes Cold Application: No Rest: Yes Massage: No Activity: No T.E.N.S.:  No Heat Application: No Leg drop or elevation: Yes Is the Current Pain Management Adequate: Adequate How does your wound impact your activities of daily livingo Sleep: No Bathing: No Appetite: No Relationship With Others: No Bladder Continence: No Emotions: No Bowel Continence: No Work: No Toileting: No Drive: No Dressing: No Hobbies: No Electronic Signature(s) Signed: 01/26/2020 5:54:34 PM By: Shawn Stall Entered By: Shawn Stall on 01/26/2020 07:53:42 -------------------------------------------------------------------------------- Patient/Caregiver Education Details Patient Name: Date of Service: Lindsay [name] 2/5/2021andnbsp7:30 AM Medical Record [id number] Patient Account Number: [id number] Date of Birth/Gender: Treating RN: 1981/12/08 (39 y.o. Arta Silence Primary Care Physician: Gwinda Passe Other Clinician: Referring Physician: Treating Physician/Extender:Robson,  Nyra Market in Treatment: 1 Education Assessment Education Provided To: Patient Education Topics Provided Wound/Skin Impairment: Handouts: Skin Care Do's and Dont's Methods: Explain/Verbal Responses: Reinforcements needed Electronic Signature(s) Signed: 01/26/2020 5:54:34 PM By: Shawn Stall Entered By: Shawn Stall on 01/26/2020 08:09:00 -------------------------------------------------------------------------------- Wound Assessment Details Patient Name: Date of Service: Lindsay, [name] 01/26/2020 7:30 AM Medical Record [id number] Patient Account Number: [id number] Date of Birth/Sex: Treating RN: 1981-02-13 (39 y.o. Debara Pickett, Yvonne Kendall Primary Care Noha Milberger: Gwinda Passe Other Clinician: Referring Rilen Shukla: Treating Lakyra Tippins/Extender:Robson, Waymon Budge, Casey Burkitt in Treatment: 1 Wound Status Wound Number: 3 Primary Lymphedema Etiology: Wound Location: Right Lower Leg Wound Open Wounding Event: Gradually Appeared Status: Date  Acquired: 01/11/2020 Comorbid Lymphedema, Asthma, Congestive Heart Weeks Of Treatment: 1 History: Failure, Coronary Artery Disease, Clustered Wound: No Hypertension, Myocardial Infarction, Hepatitis B, Type II Diabetes, Neuropathy Wound Measurements Length: (cm) 1 Width: (cm) 0.8 Depth: (cm) 0.1 Area: (cm) 0.628 Volume: (cm) 0.063 Wound Description Classification: Full Thickness Without Exposed Suppor Structures Wound Flat and Intact Margin: Exudate Medium Amount: Exudate Serosanguineous Type: Exudate red, brown Color: Wound Bed Granulation Amount: Large (67-100%) Granulation Quality: Red Necrotic Amount: None Present (0%) or After Cleansing: No Fibrino No Exposed Structure xposed: No r (Subcutaneous Tissue) Exposed: Yes xposed: No xposed: No posed: No Exposed: No % Reduction in Area: 0% % Reduction in Volume: 0% Epithelialization: None Tunneling: No Undermining: No t Foul Od Slough/ Fascia E Fat Laye Tendon E Muscle E Joint Ex Bone Treatment Notes Wound #3 (Right Lower Leg) 1. Cleanse With Wound Cleanser Soap and water 2. Periwound Care Moisturizing lotion TCA Cream 3. Primary Dressing Applied Calcium Alginate Ag 4. Secondary Dressing Dry Gauze 6. Support Layer Applied 4 layer compression wrap Notes unna boot first layer applied at upper portion of lower leg to aid in securing compression wrap. netting. Electronic Signature(s) Signed: 01/26/2020 5:54:34 PM By: Shawn Stall Entered By: Shawn Stall on 01/26/2020 07:55:15 -------------------------------------------------------------------------------- Vitals Details Patient Name: Date of Service: Lindsay Fritz D. 01/26/2020 7:30 AM Medical Record [id number] Patient Account Number: [id number] Date of Birth/Sex: Treating RN: 05/20/1981 (39 y.o. Debara Pickett, Yvonne Kendall Primary Care Dasie Chancellor: Gwinda Passe Other Clinician: Referring Audriella Blakeley: Treating Owain Eckerman/Extender:Robson,  Waymon Budge, Casey Burkitt in Treatment: 1 Vital Signs Time Taken: 07:53 Temperature (F): 98.5 Height (in): 67 Pulse (bpm): 90 Weight (lbs): 445 Respiratory Rate (breaths/min): 18 Body Mass Index (BMI): 69.7 Blood Pressure (mmHg): 143/73 Reference Range: 80 - 120 mg / dl Electronic Signature(s) Signed: 01/26/2020 5:54:34 PM By: Shawn Stall Entered By: Shawn Stall on 01/26/2020 07:54:29

## 2020-01-26 NOTE — Progress Notes (Signed)
Lindsay [name], Lindsay [name] ([id number]) Visit Report for 01/26/2020 SuperBill Details Patient Name: Date of Service: Lindsay [name], Lindsay [name] 01/26/2020 Medical Record [id number] Patient Account Number: [id number] Date of Birth/Sex: Treating RN: Dec 28, 1980 (38 y.o. Arta Silence Primary Care Provider: Gwinda Passe Other Clinician: Referring Provider: Treating Provider/Extender:Robson, Waymon Budge, Casey Burkitt in Treatment: 1 Diagnosis Coding ICD-10 Codes Code Description I89.0 Lymphedema, not elsewhere classified L97.212 Non-pressure chronic ulcer of right calf with fat layer exposed Facility Procedures CPT4 Code Description Modifier Quantity [id number] (Facility Use Only) [phone] - APPLY MULTLAY COMPRS LWR RT LEG 1 Electronic Signature(s) Signed: 01/26/2020 5:45:44 PM By: Baltazar Najjar MD Signed: 01/26/2020 5:54:34 PM By: Shawn Stall Entered By: Shawn Stall on 01/26/2020 08:09:21

## 2020-02-02 ENCOUNTER — Ambulatory Visit (HOSPITAL_COMMUNITY): Admission: RE | Admit: 2020-02-02 | Payer: Medicaid Other | Source: Ambulatory Visit

## 2020-02-02 ENCOUNTER — Other Ambulatory Visit: Payer: Self-pay

## 2020-02-02 ENCOUNTER — Other Ambulatory Visit (HOSPITAL_COMMUNITY): Payer: Self-pay | Admitting: Internal Medicine

## 2020-02-02 ENCOUNTER — Encounter (HOSPITAL_BASED_OUTPATIENT_CLINIC_OR_DEPARTMENT_OTHER): Payer: Medicaid Other | Admitting: Internal Medicine

## 2020-02-02 ENCOUNTER — Ambulatory Visit (HOSPITAL_COMMUNITY)
Admission: RE | Admit: 2020-02-02 | Discharge: 2020-02-02 | Disposition: A | Discharge status: Home / Self Care | Payer: Medicaid Other | Source: Ambulatory Visit | Attending: Vascular Surgery | Admitting: Vascular Surgery

## 2020-02-02 DIAGNOSIS — L97909 Non-pressure chronic ulcer of unspecified part of unspecified lower leg with unspecified severity: Secondary | ICD-10-CM | POA: Diagnosis not present

## 2020-02-02 DIAGNOSIS — I890 Lymphedema, not elsewhere classified: Secondary | ICD-10-CM | POA: Diagnosis not present

## 2020-02-02 DIAGNOSIS — E11622 Type 2 diabetes mellitus with other skin ulcer: Secondary | ICD-10-CM

## 2020-02-02 DIAGNOSIS — Z7689 Persons encountering health services in other specified circumstances: Secondary | ICD-10-CM | POA: Diagnosis not present

## 2020-02-02 DIAGNOSIS — L97212 Non-pressure chronic ulcer of right calf with fat layer exposed: Secondary | ICD-10-CM | POA: Diagnosis not present

## 2020-02-02 DIAGNOSIS — Z9119 Patient's noncompliance with other medical treatment and regimen: Secondary | ICD-10-CM | POA: Diagnosis not present

## 2020-02-02 DIAGNOSIS — L97219 Non-pressure chronic ulcer of right calf with unspecified severity: Secondary | ICD-10-CM | POA: Diagnosis not present

## 2020-02-05 NOTE — Progress Notes (Signed)
fat layer exposed Plan Discharge From [facility] Services: Discharge from Wound Care Center - call if wound re-opens General Notes: Order stocking from [facility]. Measurements will be provided. 1. The patient can be discharged from the wound care center 2. We gave her measurements to obtain stockings from [facility] in [facility]. She expressed understanding. 3. Compliance with  compression stockings may not be optimal here Electronic Signature(s) Signed: 02/02/2020 5:45:04 PM By: Baltazar Najjar MD Entered By: Baltazar Najjar on 02/02/2020 13:47:36 -------------------------------------------------------------------------------- SuperBill Details Patient Name: Date of Service: [name] 02/02/2020 Medical Record [id number] Patient Account Number: [id number] Date of Birth/Sex: Treating RN: 12/17/1981 (39 y.o. Harvest Dark Primary Care Provider: Gwinda Passe Other Clinician: Referring Provider: Treating Provider/Extender:Claretta Kendra, Waymon Budge, Casey Burkitt in Treatment: 2 Diagnosis Coding ICD-10 Codes Code Description I89.0 Lymphedema, not elsewhere classified L97.212 Non-pressure chronic ulcer of right calf with fat layer exposed Facility Procedures Physician Procedures CPT4 Code: 1254832 Description: 99213 - WC PHYS LEVEL 3 - EST PT ICD-10 Diagnosis Description I89.0 Lymphedema, not elsewhere classified L97.212 Non-pressure chronic ulcer of right calf with fat lay Modifier: er exposed Quantity: 1 Electronic Signature(s) Signed: 02/02/2020 5:45:04 PM By: Baltazar Najjar MD Entered By: Baltazar Najjar on 02/02/2020 13:47:52  reopening in this area has to do with uncontrolled lymphedema and stocking noncompliance. Electronic Signature(s) Signed: 02/02/2020 5:45:04 PM By: Linton Ham MD Entered By: Linton Ham on 02/02/2020 13:46:18 -------------------------------------------------------------------------------- Physical Exam Details Patient Name: Date of Service: [name] 02/02/2020 11:15 AM Medical Record [id number] Patient Account Number: [id number] Date of Birth/Sex: Treating RN: [birth year] (39 y.o. Clearnce Sorrel Primary Care Provider: Juluis Mire Other Clinician: Referring Provider: Treating Provider/Extender:Indigo Chaddock, Melanie Crazier, Zenaida Niece in Treatment: 2 Constitutional Patient is hypertensive.. Pulse regular and within target range for patient.Marland Kitchen Respirations regular, non-labored and within target range.. Temperature is normal and within the target range for the patient.Marland Kitchen Appears in no distress. Notes Wound exam; the patient has the recurrent area in the right medial calf with surrounding venous inflammation. This is fully epithelialized. There is no evidence of infection. Electronic Signature(s) Signed: 02/02/2020 5:45:04 PM By: Linton Ham MD Entered By: Linton Ham on 02/02/2020 13:47:00 -------------------------------------------------------------------------------- Physician Orders Details Patient Name: Date of Service: [name] 02/02/2020 11:15 AM Medical Record [id number] Patient Account Number: [id number] Date of Birth/Sex: Treating RN: [birth year] (39 y.o. Clearnce Sorrel Primary Care Provider: Juluis Mire Other Clinician: Referring Provider: Treating Provider/Extender:Murtaza Shell, Melanie Crazier, Zenaida Niece in Treatment: 2 Verbal / Phone Orders: No Diagnosis Coding ICD-10 Coding Code Description I89.0 Lymphedema, not elsewhere classified L97.212 Non-pressure chronic ulcer of right calf with fat layer exposed Discharge From [facility] Services Discharge from [facility] - call if wound re-opens Notes Order stocking from [facility]. Measurements will be provided. Electronic Signature(s) Signed: 02/02/2020 5:45:04 PM By: Linton Ham MD Signed: 02/05/2020 6:12:41 PM By: Kela Millin Entered By: Kela Millin on 02/02/2020 12:17:56 -------------------------------------------------------------------------------- Problem List Details Patient Name: Date of Service: [name] 02/02/2020 11:15 AM Medical Record [id number] Patient  Account Number: [id number] Date of Birth/Sex: Treating RN: Aug 29, 1981 (40 y.o. Clearnce Sorrel Primary Care Provider: Juluis Mire Other Clinician: Referring Provider: Treating Provider/Extender:Lucill Mauck, Melanie Crazier, Zenaida Niece in Treatment: 2 Active Problems ICD-10 Evaluated Encounter Code Description Active Date Today Diagnosis I89.0 Lymphedema, not elsewhere classified 01/19/2020 No Yes L97.212 Non-pressure chronic ulcer of right calf with fat layer 01/19/2020 No Yes exposed Inactive Problems Resolved Problems Electronic Signature(s) Signed: 02/02/2020 5:45:04 PM By: Linton Ham MD Entered By: Linton Ham on 02/02/2020 13:45:05 -------------------------------------------------------------------------------- Progress Note Details Patient Name: Date of Service: [name] 02/02/2020 11:15 AM Medical Record [id number] Patient Account Number: [id number] Date of Birth/Sex: Treating RN: 1981/05/19 (39 y.o. Clearnce Sorrel Primary Care Provider: Juluis Mire Other Clinician: Referring Provider: Treating Provider/Extender:Baeleigh Devincent, Melanie Crazier, Zenaida Niece in Treatment: 2 Subjective History of Present Illness (HPI) ADMISSION 02/20/2019 This is a 39 year old woman with type 2 diabetes and peripheral neuropathy. She is here for review of a wound on her right medial leg for 6 weeks. She states this began in late January when she noticed an abscess on her right leg. She was seen in the ER. A culture showed a few group B strep. She had been treated with Bactrim and Keflex for 10 days as well as mupirocin. An x-ray was negative. The patient states she feels this was an abscess of a raised fluid filled area that got larger and burst on its own. She tells me she has had a history of abscesses the worst of which was in the right shoulder region that became associated with septic arthritis. She has been applying bacitracin to the wound. She  followed up on 2/3 in the ER. Her Bactrim was extended. By  fat layer exposed Plan Discharge From [facility] Services: Discharge from Wound Care Center - call if wound re-opens General Notes: Order stocking from [facility]. Measurements will be provided. 1. The patient can be discharged from the wound care center 2. We gave her measurements to obtain stockings from [facility] in [facility]. She expressed understanding. 3. Compliance with  compression stockings may not be optimal here Electronic Signature(s) Signed: 02/02/2020 5:45:04 PM By: Baltazar Najjar MD Entered By: Baltazar Najjar on 02/02/2020 13:47:36 -------------------------------------------------------------------------------- SuperBill Details Patient Name: Date of Service: [name] 02/02/2020 Medical Record [id number] Patient Account Number: [id number] Date of Birth/Sex: Treating RN: 12/17/1981 (39 y.o. Harvest Dark Primary Care Provider: Gwinda Passe Other Clinician: Referring Provider: Treating Provider/Extender:Claretta Kendra, Waymon Budge, Casey Burkitt in Treatment: 2 Diagnosis Coding ICD-10 Codes Code Description I89.0 Lymphedema, not elsewhere classified L97.212 Non-pressure chronic ulcer of right calf with fat layer exposed Facility Procedures Physician Procedures CPT4 Code: 1254832 Description: 99213 - WC PHYS LEVEL 3 - EST PT ICD-10 Diagnosis Description I89.0 Lymphedema, not elsewhere classified L97.212 Non-pressure chronic ulcer of right calf with fat lay Modifier: er exposed Quantity: 1 Electronic Signature(s) Signed: 02/02/2020 5:45:04 PM By: Baltazar Najjar MD Entered By: Baltazar Najjar on 02/02/2020 13:47:52  reopening in this area has to do with uncontrolled lymphedema and stocking noncompliance. Electronic Signature(s) Signed: 02/02/2020 5:45:04 PM By: Linton Ham MD Entered By: Linton Ham on 02/02/2020 13:46:18 -------------------------------------------------------------------------------- Physical Exam Details Patient Name: Date of Service: [name] 02/02/2020 11:15 AM Medical Record [id number] Patient Account Number: [id number] Date of Birth/Sex: Treating RN: [birth year] (39 y.o. Clearnce Sorrel Primary Care Provider: Juluis Mire Other Clinician: Referring Provider: Treating Provider/Extender:Indigo Chaddock, Melanie Crazier, Zenaida Niece in Treatment: 2 Constitutional Patient is hypertensive.. Pulse regular and within target range for patient.Marland Kitchen Respirations regular, non-labored and within target range.. Temperature is normal and within the target range for the patient.Marland Kitchen Appears in no distress. Notes Wound exam; the patient has the recurrent area in the right medial calf with surrounding venous inflammation. This is fully epithelialized. There is no evidence of infection. Electronic Signature(s) Signed: 02/02/2020 5:45:04 PM By: Linton Ham MD Entered By: Linton Ham on 02/02/2020 13:47:00 -------------------------------------------------------------------------------- Physician Orders Details Patient Name: Date of Service: [name] 02/02/2020 11:15 AM Medical Record [id number] Patient Account Number: [id number] Date of Birth/Sex: Treating RN: [birth year] (39 y.o. Clearnce Sorrel Primary Care Provider: Juluis Mire Other Clinician: Referring Provider: Treating Provider/Extender:Murtaza Shell, Melanie Crazier, Zenaida Niece in Treatment: 2 Verbal / Phone Orders: No Diagnosis Coding ICD-10 Coding Code Description I89.0 Lymphedema, not elsewhere classified L97.212 Non-pressure chronic ulcer of right calf with fat layer exposed Discharge From [facility] Services Discharge from [facility] - call if wound re-opens Notes Order stocking from [facility]. Measurements will be provided. Electronic Signature(s) Signed: 02/02/2020 5:45:04 PM By: Linton Ham MD Signed: 02/05/2020 6:12:41 PM By: Kela Millin Entered By: Kela Millin on 02/02/2020 12:17:56 -------------------------------------------------------------------------------- Problem List Details Patient Name: Date of Service: [name] 02/02/2020 11:15 AM Medical Record [id number] Patient  Account Number: [id number] Date of Birth/Sex: Treating RN: Aug 29, 1981 (40 y.o. Clearnce Sorrel Primary Care Provider: Juluis Mire Other Clinician: Referring Provider: Treating Provider/Extender:Lucill Mauck, Melanie Crazier, Zenaida Niece in Treatment: 2 Active Problems ICD-10 Evaluated Encounter Code Description Active Date Today Diagnosis I89.0 Lymphedema, not elsewhere classified 01/19/2020 No Yes L97.212 Non-pressure chronic ulcer of right calf with fat layer 01/19/2020 No Yes exposed Inactive Problems Resolved Problems Electronic Signature(s) Signed: 02/02/2020 5:45:04 PM By: Linton Ham MD Entered By: Linton Ham on 02/02/2020 13:45:05 -------------------------------------------------------------------------------- Progress Note Details Patient Name: Date of Service: [name] 02/02/2020 11:15 AM Medical Record [id number] Patient Account Number: [id number] Date of Birth/Sex: Treating RN: 1981/05/19 (39 y.o. Clearnce Sorrel Primary Care Provider: Juluis Mire Other Clinician: Referring Provider: Treating Provider/Extender:Baeleigh Devincent, Melanie Crazier, Zenaida Niece in Treatment: 2 Subjective History of Present Illness (HPI) ADMISSION 02/20/2019 This is a 39 year old woman with type 2 diabetes and peripheral neuropathy. She is here for review of a wound on her right medial leg for 6 weeks. She states this began in late January when she noticed an abscess on her right leg. She was seen in the ER. A culture showed a few group B strep. She had been treated with Bactrim and Keflex for 10 days as well as mupirocin. An x-ray was negative. The patient states she feels this was an abscess of a raised fluid filled area that got larger and burst on its own. She tells me she has had a history of abscesses the worst of which was in the right shoulder region that became associated with septic arthritis. She has been applying bacitracin to the wound. She  followed up on 2/3 in the ER. Her Bactrim was extended. By  reopening in this area has to do with uncontrolled lymphedema and stocking noncompliance. Electronic Signature(s) Signed: 02/02/2020 5:45:04 PM By: Linton Ham MD Entered By: Linton Ham on 02/02/2020 13:46:18 -------------------------------------------------------------------------------- Physical Exam Details Patient Name: Date of Service: [name] 02/02/2020 11:15 AM Medical Record [id number] Patient Account Number: [id number] Date of Birth/Sex: Treating RN: [birth year] (39 y.o. Clearnce Sorrel Primary Care Provider: Juluis Mire Other Clinician: Referring Provider: Treating Provider/Extender:Indigo Chaddock, Melanie Crazier, Zenaida Niece in Treatment: 2 Constitutional Patient is hypertensive.. Pulse regular and within target range for patient.Marland Kitchen Respirations regular, non-labored and within target range.. Temperature is normal and within the target range for the patient.Marland Kitchen Appears in no distress. Notes Wound exam; the patient has the recurrent area in the right medial calf with surrounding venous inflammation. This is fully epithelialized. There is no evidence of infection. Electronic Signature(s) Signed: 02/02/2020 5:45:04 PM By: Linton Ham MD Entered By: Linton Ham on 02/02/2020 13:47:00 -------------------------------------------------------------------------------- Physician Orders Details Patient Name: Date of Service: [name] 02/02/2020 11:15 AM Medical Record [id number] Patient Account Number: [id number] Date of Birth/Sex: Treating RN: [birth year] (39 y.o. Clearnce Sorrel Primary Care Provider: Juluis Mire Other Clinician: Referring Provider: Treating Provider/Extender:Murtaza Shell, Melanie Crazier, Zenaida Niece in Treatment: 2 Verbal / Phone Orders: No Diagnosis Coding ICD-10 Coding Code Description I89.0 Lymphedema, not elsewhere classified L97.212 Non-pressure chronic ulcer of right calf with fat layer exposed Discharge From [facility] Discharge from [facility] - call if wound re-opens Notes Order stocking from [facility]. Measurements will be provided. Electronic Signature(s) Signed: 02/02/2020 5:45:04 PM By: Linton Ham MD Signed: 02/05/2020 6:12:41 PM By: Kela Millin Entered By: Kela Millin on 02/02/2020 12:17:56 -------------------------------------------------------------------------------- Problem List Details Patient Name: Date of Service: [name] 02/02/2020 11:15 AM Medical Record [id number] Patient  Account Number: [id number] Date of Birth/Sex: Treating RN: Aug 29, 1981 (40 y.o. Clearnce Sorrel Primary Care Provider: Juluis Mire Other Clinician: Referring Provider: Treating Provider/Extender:Lucill Mauck, Melanie Crazier, Zenaida Niece in Treatment: 2 Active Problems ICD-10 Evaluated Encounter Code Description Active Date Today Diagnosis I89.0 Lymphedema, not elsewhere classified 01/19/2020 No Yes L97.212 Non-pressure chronic ulcer of right calf with fat layer 01/19/2020 No Yes exposed Inactive Problems Resolved Problems Electronic Signature(s) Signed: 02/02/2020 5:45:04 PM By: Linton Ham MD Entered By: Linton Ham on 02/02/2020 13:45:05 -------------------------------------------------------------------------------- Progress Note Details Patient Name: Date of Service: [name] 02/02/2020 11:15 AM Medical Record [id number] Patient Account Number: [id number] Date of Birth/Sex: Treating RN: 1981/05/19 (39 y.o. Clearnce Sorrel Primary Care Provider: Juluis Mire Other Clinician: Referring Provider: Treating Provider/Extender:Baeleigh Devincent, Melanie Crazier, Zenaida Niece in Treatment: 2 Subjective History of Present Illness (HPI) ADMISSION 02/20/2019 This is a 39 year old woman with type 2 diabetes and peripheral neuropathy. She is here for review of a wound on her right medial leg for 6 weeks. She states this began in late January when she noticed an abscess on her right leg. She was seen in the ER. A culture showed a few group B strep. She had been treated with Bactrim and Keflex for 10 days as well as mupirocin. An x-ray was negative. The patient states she feels this was an abscess of a raised fluid filled area that got larger and burst on its own. She tells me she has had a history of abscesses the worst of which was in the right shoulder region that became associated with septic arthritis. She has been applying bacitracin to the wound. She  followed up on 2/3 in the ER. Her Bactrim was extended. By  reopening in this area has to do with uncontrolled lymphedema and stocking noncompliance. Electronic Signature(s) Signed: 02/02/2020 5:45:04 PM By: Linton Ham MD Entered By: Linton Ham on 02/02/2020 13:46:18 -------------------------------------------------------------------------------- Physical Exam Details Patient Name: Date of Service: [name] 02/02/2020 11:15 AM Medical Record [id number] Patient Account Number: [id number] Date of Birth/Sex: Treating RN: [birth year] (39 y.o. Clearnce Sorrel Primary Care Provider: Juluis Mire Other Clinician: Referring Provider: Treating Provider/Extender:Indigo Chaddock, Melanie Crazier, Zenaida Niece in Treatment: 2 Constitutional Patient is hypertensive.. Pulse regular and within target range for patient.Marland Kitchen Respirations regular, non-labored and within target range.. Temperature is normal and within the target range for the patient.Marland Kitchen Appears in no distress. Notes Wound exam; the patient has the recurrent area in the right medial calf with surrounding venous inflammation. This is fully epithelialized. There is no evidence of infection. Electronic Signature(s) Signed: 02/02/2020 5:45:04 PM By: Linton Ham MD Entered By: Linton Ham on 02/02/2020 13:47:00 -------------------------------------------------------------------------------- Physician Orders Details Patient Name: Date of Service: [name] 02/02/2020 11:15 AM Medical Record [id number] Patient Account Number: [id number] Date of Birth/Sex: Treating RN: [birth year] (39 y.o. Clearnce Sorrel Primary Care Provider: Juluis Mire Other Clinician: Referring Provider: Treating Provider/Extender:Murtaza Shell, Melanie Crazier, Zenaida Niece in Treatment: 2 Verbal / Phone Orders: No Diagnosis Coding ICD-10 Coding Code Description I89.0 Lymphedema, not elsewhere classified L97.212 Non-pressure chronic ulcer of right calf with fat layer exposed Discharge From [facility] Discharge from [facility] - call if wound re-opens Notes Order stocking from [facility]. Measurements will be provided. Electronic Signature(s) Signed: 02/02/2020 5:45:04 PM By: Linton Ham MD Signed: 02/05/2020 6:12:41 PM By: Kela Millin Entered By: Kela Millin on 02/02/2020 12:17:56 -------------------------------------------------------------------------------- Problem List Details Patient Name: Date of Service: [name] 02/02/2020 11:15 AM Medical Record [id number] Patient  Account Number: [id number] Date of Birth/Sex: Treating RN: Aug 29, 1981 (40 y.o. Clearnce Sorrel Primary Care Provider: Juluis Mire Other Clinician: Referring Provider: Treating Provider/Extender:Lucill Mauck, Melanie Crazier, Zenaida Niece in Treatment: 2 Active Problems ICD-10 Evaluated Encounter Code Description Active Date Today Diagnosis I89.0 Lymphedema, not elsewhere classified 01/19/2020 No Yes L97.212 Non-pressure chronic ulcer of right calf with fat layer 01/19/2020 No Yes exposed Inactive Problems Resolved Problems Electronic Signature(s) Signed: 02/02/2020 5:45:04 PM By: Linton Ham MD Entered By: Linton Ham on 02/02/2020 13:45:05 -------------------------------------------------------------------------------- Progress Note Details Patient Name: Date of Service: [name] 02/02/2020 11:15 AM Medical Record [id number] Patient Account Number: [id number] Date of Birth/Sex: Treating RN: 1981/05/19 (39 y.o. Clearnce Sorrel Primary Care Provider: Juluis Mire Other Clinician: Referring Provider: Treating Provider/Extender:Baeleigh Devincent, Melanie Crazier, Zenaida Niece in Treatment: 2 Subjective History of Present Illness (HPI) ADMISSION 02/20/2019 This is a 39 year old woman with type 2 diabetes and peripheral neuropathy. She is here for review of a wound on her right medial leg for 6 weeks. She states this began in late January when she noticed an abscess on her right leg. She was seen in the ER. A culture showed a few group B strep. She had been treated with Bactrim and Keflex for 10 days as well as mupirocin. An x-ray was negative. The patient states she feels this was an abscess of a raised fluid filled area that got larger and burst on its own. She tells me she has had a history of abscesses the worst of which was in the right shoulder region that became associated with septic arthritis. She has been applying bacitracin to the wound. She  followed up on 2/3 in the ER. Her Bactrim was extended. By

## 2020-02-05 NOTE — Progress Notes (Signed)
1 5 []  - Complex Wound Cleansing - multiple wounds 0 X - Wound Imaging (photographs - any number of wounds) 1 5 []  - Wound Tracing (instead of photographs) 0 X - Simple Wound Measurement - one wound 1 5 []  - Complex Wound Measurement - multiple wounds 0 INTERVENTIONS - Wound Dressings []  - Small Wound Dressing one or multiple wounds 0 []  - Medium Wound Dressing one or multiple wounds 0 []  - Large Wound Dressing one or multiple wounds 0 []  - Application of Medications - topical 0 []  - Application of Medications - injection 0 INTERVENTIONS - Miscellaneous []  - External ear exam 0 []  - Specimen Collection (cultures, biopsies, blood, body fluids, etc.) 0 []  - Specimen(s) / Culture(s) sent or taken to Lab for analysis 0 []  - Patient Transfer (multiple staff / [employer] fast streamer / Similar devices) 0 []  - Simple Staple / Suture removal (25 or less) 0 []  - Complex Staple / Suture removal (26 or more) 0 []  - Hypo / Hyperglycemic Management (close monitor of Blood Glucose) 0 []  - Ankle / Brachial Index (ABI) - do not check if billed separately 0 X - Vital Signs 1 5 Has the patient been seen at the hospital within the last three years: Yes Total Score: 80 Level Of Care: New/Established - Level 3 Electronic Signature(s) Signed: 02/05/2020 6:12:41 PM By: Kela Millin Entered By: Kela Millin on 02/02/2020 12:49:07 -------------------------------------------------------------------------------- Lower Extremity Assessment Details Patient Name: Date of Service: Lindsay [name], Lindsay [name] 02/02/2020 11:15 AM Medical Record [id number] Patient Account Number: [id number] Date of Birth/Sex: Treating RN: [birth year] (39 y.o. Elam Dutch Primary Care Jahron Hunsinger: Juluis Mire Other  Clinician: Referring Omarrion Carmer: Treating Lindberg Zenon/Extender:Robson, Melanie Crazier, Zenaida Niece in Treatment: 2 Edema Assessment Assessed: [Left: No] [Right: No] Edema: [Left: Ye] [Right: s] Calf Left: Right: Point of Measurement: 44 cm From Medial Instep cm 46 cm Ankle Left: Right: Point of Measurement: 12 cm From Medial Instep cm 26 cm Vascular Assessment Pulses: Dorsalis Pedis Palpable: [Right:Yes] Electronic Signature(s) Signed: 02/02/2020 5:30:09 PM By: Baruch Gouty RN, BSN Entered By: Baruch Gouty on 02/02/2020 11:58:10 -------------------------------------------------------------------------------- Multi Wound Chart Details Patient Name: Date of Service: Lindsay Heater D. 02/02/2020 11:15 AM Medical Record [id number] Patient Account Number: [id number] Date of Birth/Sex: Treating RN: 1981/03/24 (39 y.o. Clearnce Sorrel Primary Care Carvel Huskins: Juluis Mire Other Clinician: Referring Branae Crail: Treating Draper Gallon/Extender:Robson, Melanie Crazier, Zenaida Niece in Treatment: 2 Vital Signs Height(in): 67 Capillary Blood 145 Glucose(mg/dl): Weight(lbs): 445 Pulse(bpm): 93 Body Mass Index(BMI): 36 Blood Pressure(mmHg): 154/78 Temperature(F): 98.2 Respiratory 20 Rate(breaths/min): Photos: [3:No Photos] [N/A:N/A] Wound Location: [3:Right Lower Leg] [N/A:N/A] Wounding Event: [3:Gradually Appeared] [N/A:N/A] Primary Etiology: [3:Lymphedema] [N/A:N/A] Comorbid History: [3:Lymphedema, Asthma, Congestive Heart Failure, Coronary Artery Disease, Hypertension, Myocardial Infarction, Hepatitis B, Type II Diabetes, Neuropathy] [N/A:N/A] Date Acquired: [3:01/11/2020] [N/A:N/A] Weeks of Treatment: [3:2] [N/A:N/A] Wound Status: [3:Healed - Epithelialized] [N/A:N/A] Measurements L x W x D 0x0x0 [N/A:N/A] (cm) Area (cm) : [3:0] [N/A:N/A] Volume (cm) : [3:0] [N/A:N/A] % Reduction in Area: [3:100.00%] [N/A:N/A] % Reduction in Volume: 100.00%  [N/A:N/A] Classification: [3:Full Thickness Without Exposed Support Structures] [N/A:N/A] Exudate Amount: [3:None Present] [N/A:N/A] Wound Margin: [3:Flat and Intact] [N/A:N/A] Granulation Amount: [3:None Present (0%)] [N/A:N/A] Necrotic Amount: [3:None Present (0%)] [N/A:N/A] Exposed Structures: [3:Fascia: No Fat Layer (Subcutaneous Tissue) Exposed: No Tendon: No Muscle: No Joint: No Bone: No Large (67-100%)] [N/A:N/A N/A] Treatment Notes Electronic Signature(s) Signed: 02/02/2020 5:45:04 PM By: Linton Ham MD Signed: 02/05/2020 6:12:41 PM By: Kela Millin Entered By: Linton Ham on 02/02/2020 13:45:16 --------------------------------------------------------------------------------  Multi-Disciplinary Care Plan Details Patient Name: Date of Service: Lindsay [name], Lindsay [name] 02/02/2020 11:15 AM Medical Record [id number] Patient Account Number: [id number] Date of Birth/Sex: Treating RN: December 01, 1981 (39 y.o. Harvest Dark Primary Care Cisco Kindt: Gwinda Passe Other Clinician: Referring Shaquel Chavous: Treating Charrisse Masley/Extender:Robson, Waymon Budge, Casey Burkitt in Treatment: 2 Active Inactive Electronic Signature(s) Signed: 02/05/2020 6:12:41 PM By: Cherylin Mylar Entered By: Cherylin Mylar on 02/02/2020 12:20:56 -------------------------------------------------------------------------------- Pain Assessment Details Patient Name: Date of Service: Lindsay [name], Lindsay [name] 02/02/2020 11:15 AM Medical Record [id number] Patient Account Number: [id number] Date of Birth/Sex: Treating RN: 01-06-1981 (39 y.o. Tommye Standard Primary Care Sherrin Stahle: Gwinda Passe Other Clinician: Referring Charly Hunton: Treating Sani Loiseau/Extender:Robson, Waymon Budge, Casey Burkitt in Treatment: 2 Active Problems Location of Pain Severity and Description of Pain Patient Has Paino Yes Site Locations Rate the pain. Current Pain Level: 10 Pain Management and  Medication Current Pain Management: Notes c/o sciatica pain in back Electronic Signature(s) Signed: 02/02/2020 5:30:09 PM By: Zenaida Deed RN, BSN Entered By: Zenaida Deed on 02/02/2020 11:57:55 -------------------------------------------------------------------------------- Wound Assessment Details Patient Name: Date of Service: Lindsay Fritz D. 02/02/2020 11:15 AM Medical Record [id number] Patient Account Number: [id number] Date of Birth/Sex: Treating RN: August 15, 1981 (39 y.o. Tommye Standard Primary Care Banjamin Stovall: Gwinda Passe Other Clinician: Referring Lilymae Swiech: Treating Fredie Majano/Extender:Robson, Waymon Budge, Casey Burkitt in Treatment: 2 Wound Status Wound Number: 3 Primary Lymphedema Etiology: Wound Location: Right Lower Leg Wound Healed - Epithelialized Wounding Event: Gradually Appeared Status: Date Acquired: 01/11/2020 Comorbid Lymphedema, Asthma, Congestive Heart Weeks Of Treatment: 2 History: Failure, Coronary Artery Disease, Clustered Wound: No Hypertension, Myocardial Infarction, Hepatitis B, Type II Diabetes, Neuropathy Photos Wound Measurements Length: (cm) 0 % Reduct Width: (cm) 0 % Reduct Depth: (cm) 0 Epitheli Area: (cm) 0 Tunneli Volume: (cm) 0 Undermi Wound Description Classification: Full Thickness Without Exposed Support Foul Od Structures Slough/ Wound Flat and Intact Margin: Exudate None Present Amount: Wound Bed Granulation Amount: None Present (0%) Necrotic Amount: None Present (0%) Fascia Fat Lay Tendon Muscle Joint E Bone Ex Electronic Signature(s) Signed: 02/02/2020 4:39:06 PM By: Benjaman Kindler EMT/HBOT Signed: 02/02/2020 5:30:09 PM By: Zenaida Deed RN, BSN Entered By: Benjaman Kindler on 02/12/ or After Cleansing: No Fibrino No Exposed Structure Exposed: No er (Subcutaneous Tissue) Exposed: No Exposed: No Exposed: No xposed: No posed: No 2021 14:41:37 ion in Area: 100% ion in Volume:  100% alization: Large (67-100%) ng: No ning: No -------------------------------------------------------------------------------- Vitals Details Patient Name: Date of Service: VESTAL, CRANDALL 02/02/2020 11:15 AM Medical Record [id number] Patient Account Number: [id number] Date of Birth/Sex: Treating RN: 06/25/1981 (39 y.o. Tommye Standard Primary Care Kele Withem: Gwinda Passe Other Clinician: Referring Tahliyah Anagnos: Treating Hikeem Andersson/Extender:Robson, Waymon Budge, Casey Burkitt in Treatment: 2 Vital Signs Time Taken: 11:53 Temperature (F): 98.2 Height (in): 67 Pulse (bpm): 93 Weight (lbs): 445 Respiratory Rate (breaths/min): 20 Body Mass Index (BMI): 69.7 Blood Pressure (mmHg): 154/78 Capillary Blood Glucose (mg/dl): 053 Reference Range: 80 - 120 mg / dl Notes glucose per pt report Electronic Signature(s) Signed: 02/02/2020 5:30:09 PM By: Zenaida Deed RN, BSN Entered By: Zenaida Deed on 02/02/2020 11:54:56  Multi-Disciplinary Care Plan Details Patient Name: Date of Service: Lindsay [name], Lindsay [name] 02/02/2020 11:15 AM Medical Record [id number] Patient Account Number: [id number] Date of Birth/Sex: Treating RN: December 01, 1981 (39 y.o. Harvest Dark Primary Care Cisco Kindt: Gwinda Passe Other Clinician: Referring Shaquel Chavous: Treating Charrisse Masley/Extender:Robson, Waymon Budge, Casey Burkitt in Treatment: 2 Active Inactive Electronic Signature(s) Signed: 02/05/2020 6:12:41 PM By: Cherylin Mylar Entered By: Cherylin Mylar on 02/02/2020 12:20:56 -------------------------------------------------------------------------------- Pain Assessment Details Patient Name: Date of Service: Lindsay [name], Lindsay [name] 02/02/2020 11:15 AM Medical Record [id number] Patient Account Number: [id number] Date of Birth/Sex: Treating RN: 01-06-1981 (39 y.o. Tommye Standard Primary Care Sherrin Stahle: Gwinda Passe Other Clinician: Referring Charly Hunton: Treating Sani Loiseau/Extender:Robson, Waymon Budge, Casey Burkitt in Treatment: 2 Active Problems Location of Pain Severity and Description of Pain Patient Has Paino Yes Site Locations Rate the pain. Current Pain Level: 10 Pain Management and  Medication Current Pain Management: Notes c/o sciatica pain in back Electronic Signature(s) Signed: 02/02/2020 5:30:09 PM By: Zenaida Deed RN, BSN Entered By: Zenaida Deed on 02/02/2020 11:57:55 -------------------------------------------------------------------------------- Wound Assessment Details Patient Name: Date of Service: Lindsay Fritz D. 02/02/2020 11:15 AM Medical Record [id number] Patient Account Number: [id number] Date of Birth/Sex: Treating RN: August 15, 1981 (39 y.o. Tommye Standard Primary Care Banjamin Stovall: Gwinda Passe Other Clinician: Referring Lilymae Swiech: Treating Fredie Majano/Extender:Robson, Waymon Budge, Casey Burkitt in Treatment: 2 Wound Status Wound Number: 3 Primary Lymphedema Etiology: Wound Location: Right Lower Leg Wound Healed - Epithelialized Wounding Event: Gradually Appeared Status: Date Acquired: 01/11/2020 Comorbid Lymphedema, Asthma, Congestive Heart Weeks Of Treatment: 2 History: Failure, Coronary Artery Disease, Clustered Wound: No Hypertension, Myocardial Infarction, Hepatitis B, Type II Diabetes, Neuropathy Photos Wound Measurements Length: (cm) 0 % Reduct Width: (cm) 0 % Reduct Depth: (cm) 0 Epitheli Area: (cm) 0 Tunneli Volume: (cm) 0 Undermi Wound Description Classification: Full Thickness Without Exposed Support Foul Od Structures Slough/ Wound Flat and Intact Margin: Exudate None Present Amount: Wound Bed Granulation Amount: None Present (0%) Necrotic Amount: None Present (0%) Fascia Fat Lay Tendon Muscle Joint E Bone Ex Electronic Signature(s) Signed: 02/02/2020 4:39:06 PM By: Benjaman Kindler EMT/HBOT Signed: 02/02/2020 5:30:09 PM By: Zenaida Deed RN, BSN Entered By: Benjaman Kindler on 02/12/ or After Cleansing: No Fibrino No Exposed Structure Exposed: No er (Subcutaneous Tissue) Exposed: No Exposed: No Exposed: No xposed: No posed: No 2021 14:41:37 ion in Area: 100% ion in Volume:  100% alization: Large (67-100%) ng: No ning: No -------------------------------------------------------------------------------- Vitals Details Patient Name: Date of Service: VESTAL, CRANDALL 02/02/2020 11:15 AM Medical Record [id number] Patient Account Number: [id number] Date of Birth/Sex: Treating RN: 06/25/1981 (39 y.o. Tommye Standard Primary Care Kele Withem: Gwinda Passe Other Clinician: Referring Tahliyah Anagnos: Treating Hikeem Andersson/Extender:Robson, Waymon Budge, Casey Burkitt in Treatment: 2 Vital Signs Time Taken: 11:53 Temperature (F): 98.2 Height (in): 67 Pulse (bpm): 93 Weight (lbs): 445 Respiratory Rate (breaths/min): 20 Body Mass Index (BMI): 69.7 Blood Pressure (mmHg): 154/78 Capillary Blood Glucose (mg/dl): 053 Reference Range: 80 - 120 mg / dl Notes glucose per pt report Electronic Signature(s) Signed: 02/02/2020 5:30:09 PM By: Zenaida Deed RN, BSN Entered By: Zenaida Deed on 02/02/2020 11:54:56

## 2020-02-14 DIAGNOSIS — Z7689 Persons encountering health services in other specified circumstances: Secondary | ICD-10-CM | POA: Diagnosis not present

## 2020-02-14 DIAGNOSIS — E1140 Type 2 diabetes mellitus with diabetic neuropathy, unspecified: Secondary | ICD-10-CM | POA: Diagnosis not present

## 2020-02-14 DIAGNOSIS — Z794 Long term (current) use of insulin: Secondary | ICD-10-CM | POA: Diagnosis not present

## 2020-02-14 DIAGNOSIS — H5213 Myopia, bilateral: Secondary | ICD-10-CM | POA: Diagnosis not present

## 2020-02-14 DIAGNOSIS — E1165 Type 2 diabetes mellitus with hyperglycemia: Secondary | ICD-10-CM | POA: Diagnosis not present

## 2020-02-19 DIAGNOSIS — G4733 Obstructive sleep apnea (adult) (pediatric): Secondary | ICD-10-CM | POA: Diagnosis not present

## 2020-02-21 ENCOUNTER — Other Ambulatory Visit (INDEPENDENT_AMBULATORY_CARE_PROVIDER_SITE_OTHER): Payer: Self-pay | Admitting: Primary Care

## 2020-02-21 DIAGNOSIS — Z760 Encounter for issue of repeat prescription: Secondary | ICD-10-CM

## 2020-02-21 DIAGNOSIS — I10 Essential (primary) hypertension: Secondary | ICD-10-CM

## 2020-02-21 DIAGNOSIS — Z794 Long term (current) use of insulin: Secondary | ICD-10-CM

## 2020-02-21 DIAGNOSIS — E1140 Type 2 diabetes mellitus with diabetic neuropathy, unspecified: Secondary | ICD-10-CM

## 2020-02-22 NOTE — Telephone Encounter (Signed)
Sent to PCP ?

## 2020-02-27 MED ORDER — AMLODIPINE BESYLATE 10 MG PO TABS: 10.0000 mg | ORAL_TABLET | Freq: Every day | ORAL | 0 refills | Status: DC

## 2020-02-27 MED ORDER — GABAPENTIN 300 MG PO CAPS: 600.0000 mg | ORAL_CAPSULE | Freq: Three times a day (TID) | ORAL | 0 refills | Status: DC

## 2020-03-20 ENCOUNTER — Other Ambulatory Visit (INDEPENDENT_AMBULATORY_CARE_PROVIDER_SITE_OTHER): Payer: Self-pay | Admitting: Nurse Practitioner

## 2020-03-20 DIAGNOSIS — I10 Essential (primary) hypertension: Secondary | ICD-10-CM

## 2020-03-21 DIAGNOSIS — G4733 Obstructive sleep apnea (adult) (pediatric): Secondary | ICD-10-CM | POA: Diagnosis not present

## 2020-03-28 NOTE — Progress Notes (Signed)
Telehealth Visit     Virtual Visit via Video Note   This visit type was conducted due to national recommendations for restrictions regarding the COVID-19 Pandemic (e.g. social distancing) in an effort to limit this patient's exposure and mitigate transmission in our community.  Due to her co-morbid illnesses, this patient is at least at moderate risk for complications without adequate follow up.  This format is felt to be most appropriate for this patient at this time.  All issues noted in this document were discussed and addressed.  A limited physical exam was performed with this format.  Please refer to the patient's chart for her consent to telehealth for [facility].   Evaluation Performed:  Follow-up visit  This visit type was conducted due to national recommendations for restrictions regarding the COVID-19 Pandemic (e.g. social distancing).  This format is felt to be most appropriate for this patient at this time.  All issues noted in this document were discussed and addressed.  No physical exam was performed (except for noted visual exam findings with Video Visits).  Please refer to the patient's chart (MyChart message for video visits and phone note for telephone visits) for the patient's consent to telehealth for [facility].  Date:  04/01/2020   ID:  Lindsay [name], DOB March 31, 1981, MRN [id number]  Patient Location:  Home  Provider location:   Home  PCP:  [name], NP  Cardiologist:  Servando Snare & No primary care provider on file.  Electrophysiologist:  None   Chief Complaint:  Follow up  History of Present Illness:    Lindsay [name] is a 39 y.o. female who presents via audio/video conferencing for a telehealth visit today.  Seen for Dr. [name].   She has a history of known CAD with prior NSTEMI in September of 2017 - s/p cath and PCI with DES to mLAD and PTCA only to 2nd diag ostium. Had 50% residual stenosis. EF with cath 35-45% but by Echo was 55-60%. Other  issues include morbid obesity, negative testing for OSA, DM, HTN, HLD and palpitations.   Last seen in January of 2020 by Dr. [name] - some irregular heart beat endorsed. Was to be seen in a bariatric clinic in [place].   The patient does not have symptoms concerning for COVID-19 infection (fever, chills, cough, or new shortness of breath).   Seen today by a telephone call. Declined video visit. She has consented for this visit. She says she is doing ok but had told my CMA that she had chest pain. Notes she has tried to be more active over the past week - this caused "slight" chest pain. Not able to get a blood pressure. Her weight continues to climb. She describes a sharp pain and a tightness in her chest - stops with rest. She feels this is similar to her prior chest pain syndrome. She says she is still on the path for weight loss surgery thru [facility] - no plan for an actual surgery date. She is trying to meet goals. She notes it is very hard to stay out of the cookie aisle when getting groceries. She had an A1C of 7.8 back in February. She is out of her NTG - needs refilling. Unclear how long she has been out of. No BP check. No recent labs.   Past Medical History:  Diagnosis Date  . ARF (acute renal failure) ([place]) 04/2015  . Asthma   . Cellulitis of right upper extremity   . Coronary artery  disease   . Diabetes mellitus    insulin dependent  . Hyperlipidemia LDL goal <70   . Hypertension   . NSTEMI (non-ST elevated myocardial infarction) ([place]) 08/2016  . Obesity   . S/P angioplasty with stent 08/2016   DES to mLAD and PTCA only to 2nd diag ostium.    Past Surgical History:  Procedure Laterality Date  . CARDIAC CATHETERIZATION N/A 09/07/2016   Procedure: Left Heart Cath and Coronary Angiography;  Surgeon: [name], MD;  Location: [place] [facility];  Service: Cardiovascular;  Laterality: N/A;  . CARDIAC CATHETERIZATION N/A 09/07/2016   Procedure: Coronary Stent Intervention;  Surgeon:  [name], MD;  Location: [place] [facility];  Service: Cardiovascular;  Laterality: N/A;  . CARDIAC CATHETERIZATION N/A 09/07/2016   Procedure: Coronary Balloon Angioplasty;  Surgeon: [name], MD;  Location: [place] [facility];  Service: Cardiovascular;  Laterality: N/A;  . CESAREAN SECTION    . IRRIGATION AND DEBRIDEMENT SHOULDER Right 04/30/2015   Procedure: IRRIGATION AND DEBRIDEMENT SHOULDER;  Surgeon: Renette Butters, MD;  Location: [place];  Service: Orthopedics;  Laterality: Right;  . IRRIGATION AND DEBRIDEMENT SHOULDER Right 05/01/2015  . LEG SURGERY    . SHOULDER ARTHROSCOPY Right 04/30/2015   Procedure: ARTHROSCOPY SHOULDER;  Surgeon: Renette Butters, MD;  Location: [place];  Service: Orthopedics;  Laterality: Right;  . TONSILLECTOMY       Current Meds  Medication Sig  . Accu-Chek FastClix Lancets MISC USE 1 TO CHECK GLUCOSE 4 TIMES DAILY  . albuterol (VENTOLIN HFA) 108 (90 Base) MCG/ACT inhaler INHALE 2 PUFFS BY MOUTH EVERY 6 HOURS AS NEEDED FOR WHEEZING FOR SHORTNESS OF BREATH  . amLODipine (NORVASC) 10 MG tablet Take 1 tablet (10 mg total) by mouth daily.  . APPLE CIDER VINEGAR PO Take by mouth.  Marland Kitchen aspirin 81 MG tablet Take 1 tablet (81 mg total) by mouth daily.  Marland Kitchen aspirin-acetaminophen-caffeine (EXCEDRIN MIGRAINE) 250-250-65 MG tablet Take 2 tablets by mouth every 6 (six) hours as needed for headache.  Marland Kitchen atorvastatin (LIPITOR) 40 MG tablet Take 1 tablet (40 mg total) by mouth daily at 6 PM. Please call and schedule November/December follow up. [phone]  . b complex vitamins tablet Take 1 tablet by mouth daily.  . baclofen (LIORESAL) 10 MG tablet Take 1 tablet (10 mg total) by mouth 3 (three) times daily as needed for muscle spasms.  . BD VEO INSULIN SYRINGE U/F 31G X 15/64" 1 ML MISC USE  SYRINGE ONCE DAILY  . Blood Glucose Monitoring Suppl (ACCU-CHEK AVIVA PLUS) w/Device KIT 1 each by Does not apply route as directed.  . [place] Calcium-Magnesium-Vit D [phone]  MG-MG-UNIT CAPS Take 1 tablet by mouth daily.  . diclofenac Sodium (VOLTAREN) 1 % GEL Apply topically.  . diphenhydrAMINE (SOMINEX) 25 MG tablet Take by mouth.  . DULoxetine (CYMBALTA) 30 MG capsule Take 1 capsule by mouth once daily  . empagliflozin (JARDIANCE) 10 MG TABS tablet Take 10 mg by mouth daily.  Marland Kitchen ezetimibe (ZETIA) 10 MG tablet Take 1 tablet (10 mg total) by mouth daily.  . fluticasone (FLOVENT HFA) 110 MCG/ACT inhaler Inhale 2 puffs into the lungs 2 (two) times daily.  Marland Kitchen gabapentin (NEURONTIN) 300 MG capsule Take 2 capsules (600 mg total) by mouth 3 (three) times daily.  Marland Kitchen glucose blood (ACCU-CHEK AVIVA PLUS) test strip Use as instructed to test blood sugar 4 times daily.  . hydrochlorothiazide (HYDRODIURIL) 25 MG tablet Take 1 tablet (25 mg total)  by mouth every morning.  . hydrocortisone 2.5 % ointment Apply topically for rash on face up to twice daily as needed.  Marland Kitchen ibuprofen (ADVIL,MOTRIN) 200 MG tablet Take 400 mg by mouth every 6 (six) hours as needed for fever or moderate pain.  . Insulin Pen Needle (B-D UF III MINI PEN NEEDLES) 31G X 5 MM MISC Use as instructed. Monitor blood glucose levels three times per day  . insulin regular human CONCENTRATED (HUMULIN R) 500 UNIT/ML injection INJECT 70 UNITS 30 MINUTES BEFORE EACH MEAL  . LANTUS SOLOSTAR 100 UNIT/ML Solostar Pen INJECT 50 UNITS SUBCUTANEOUSLY AT BEDTIME  . liraglutide (VICTOZA) 18 MG/3ML SOPN Inject 0.39m daily x1 week then 1.222mdaily x1 week then 1.46m19maily and continue  . losartan (COZAAR) 100 MG tablet Take 1 tablet (100 mg total) by mouth daily.  . Melatonin 10 MG TABS Take 3 tablets by mouth at bedtime.  . methocarbamol (ROBAXIN) 750 MG tablet Take 1 tablet (750 mg total) by mouth at bedtime.  . metoprolol tartrate (LOPRESSOR) 50 MG tablet Take 1 tablet by mouth twice daily  . Multiple Vitamins-Minerals (MULTIVITAMIN PO) Take 1 tablet by mouth daily.  . mupirocin ointment (BACTROBAN) 2 % Apply twice daily to the  affected area x 7 days or until healed  . nitroGLYCERIN (NITROSTAT) 0.4 MG SL tablet Place 1 tablet (0.4 mg total) under the tongue every 5 (five) minutes as needed for chest pain.  . silver sulfADIAZINE (SILVADENE) 1 % cream Apply 1 application topically 2 (two) times daily.  . sodium chloride (OCEAN) 0.65 % nasal spray Place into the nose.  . [DISCONTINUED] nitroGLYCERIN (NITROSTAT) 0.4 MG SL tablet Place 1 tablet (0.4 mg total) under the tongue every 5 (five) minutes as needed for chest pain.     Allergies:   Hydrazine yellow [tartrazine], Lisinopril, and Tylenol [acetaminophen]   Social History   Tobacco Use  . Smoking status: Never Smoker  . Smokeless tobacco: Never Used  Substance Use Topics  . Alcohol use: Yes    Comment: socially  . Drug use: No     Family Hx: The patient's family history includes Cancer in her maternal grandmother; Diabetes in her father and mother; Heart disease in her father and mother; Hypertension in her mother; Stroke in her maternal grandmother.  ROS:   Please see the history of present illness.   All other systems reviewed are negative.    Objective:    Vital Signs:  Ht 5' 8"  (1.727 m)   Wt (!) 435 lb 12.8 oz (197.7 kg)   BMI 66.26 kg/m    Wt Readings from Last 3 Encounters:  04/01/20 (!) 435 lb 12.8 oz (197.7 kg)  12/05/19 (!) 434 lb (196.9 kg)  10/05/19 (!) 416 lb 12.8 oz (189.1 kg)    Alert female in no acute distress. She sounds a little sedated/sleepy to me.    Labs/Other Tests and Data Reviewed:    Lab Results  Component Value Date   WBC 7.6 04/12/2019   HGB 13.2 04/12/2019   HCT 41.0 04/12/2019   PLT 284 04/12/2019   GLUCOSE 198 (H) 01/31/2019   CHOL 138 12/05/2019   TRIG 134 12/05/2019   HDL 47 12/05/2019   LDLCALC 67 12/05/2019   ALT 32 01/11/2019   AST 27 01/11/2019   NA 139 01/31/2019   K 4.8 01/31/2019   CL 100 01/31/2019   CREATININE 0.81 01/31/2019   BUN 15 01/31/2019   CO2 22 01/31/2019   TSH 2.930  05/28/2017   INR 1.01 09/07/2016   HGBA1C 8.4 (H) 04/12/2019   MICROALBUR 9.6 (H) 02/18/2017     BNP (last 3 results) No results for input(s): BNP in the last 8760 hours.  ProBNP (last 3 results) No results for input(s): PROBNP in the last 8760 hours.    Prior CV studies:     The following studies were reviewed today:  2D Doppler echocardiogram September 2017: Study Conclusions  - Left ventricle: The cavity size was normal. There was mild concentric hypertrophy. Systolic function was normal. The estimated ejection fraction was in the range of 55% to 60%. Mild hypokinesis of the anteroseptal, anterior, and apical myocardium. Left ventricular diastolic function parameters were normal. - Mitral valve: Transvalvular velocity was within the normal range. There was no evidence for stenosis. There was trivial regurgitation. - Right ventricle: The cavity size was normal. Wall thickness was normal. Systolic function was normal.  Cardiac catheterization September 2017: Diagnostic  Dominance: Right  Coronary Balloon Angioplasty  Coronary Stent Intervention  Left Heart Cath and Coronary Angiography  Conclusion    The left ventricular ejection fraction is 35-45% by visual estimate.  LV end diastolic pressure is moderately elevated.  There is no mitral valve regurgitation.    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 This is a culprit for the patient's presentation and elevated troponins.  Successful PCI and stent implantation reducing the 100% stenosis to 0% with TIMI grade 3 flow. Final postdilatation diameter 3.5 mm. Xience Alpine 3.0 x 18 mm DES was used.  Successful PTCA of second diagonal ostium from 75% to 50% pre-stenting.  25-40% stenosis in the proximal circumflex, and 50% segmental stenosis in the proximal first obtuse marginal.  Anteroapical akinesis. LVEDP 26 mmHg. Estimated  EF 35-45%.  Recommendations:   Aggressive risk factor modification including high-intensity statin therapy, aggressive glycemic control, weight reduction, blood pressure control, low-fat carbohydrate modified diet, and exercise.  Eligible for discharge in 24 hours assuming no complications.  IV heparin was discontinued.  Aspirin and Brilinta 6-12 months.  Beta blocker therapy and ACE/ARB to treat what I believe will be reversible LV systolic dysfunction.       Intervention      ASSESSMENT & PLAN:    1.  CAD - notes recurrent chest pain - very difficult to sort out over the phone - needs in person visit. NTG refilled for her today. She is agreeable to coming in for in person visit. Has known CAD - her obesity makes this quite challenging.   2. Morbid obesity - does not sound like she has been able to meet goals for weight loss surgery.   3. HTN  - no BP check - unclear status.   4. HLD - no recent labs.   5. DM - on SGLT2 therapy - A1C of 7.8 noted from February.   6. COVID-19 Education: The signs and symptoms of COVID-19 were discussed with the patient and how to seek care for testing (follow up with PCP or arrange E-visit).  The importance of social distancing, staying at home, hand hygiene and wearing a mask when out in public were discussed today.  Patient Risk:   After full review of this patient's clinical status, I feel that they are at least moderate risk at this time.  Time:   Today, I have spent 8 minutes with the patient with telehealth technology discussing the above issues.  Medication Adjustments/Labs and Tests Ordered: Current medicines are reviewed at length with the patient today.  Concerns regarding medicines are outlined above.   Tests Ordered: No orders of the defined types were placed in this encounter.   Medication Changes: Meds ordered this encounter  Medications  . nitroGLYCERIN (NITROSTAT) 0.4 MG SL tablet    Sig: Place 1 tablet  (0.4 mg total) under the tongue every 5 (five) minutes as needed for chest pain.    Dispense:  25 tablet    Refill:  11    Disposition:  FU with [place] in the office for in person visit with EKG.   Patient is agreeable to this plan and will call if any problems develop in the interim.   Amie Critchley, NP  04/01/2020 10:07 AM    [place]

## 2020-04-01 ENCOUNTER — Other Ambulatory Visit: Payer: Self-pay

## 2020-04-01 ENCOUNTER — Encounter: Payer: Self-pay | Admitting: Nurse Practitioner

## 2020-04-01 ENCOUNTER — Telehealth: Payer: Self-pay | Admitting: *Deleted

## 2020-04-01 ENCOUNTER — Telehealth (INDEPENDENT_AMBULATORY_CARE_PROVIDER_SITE_OTHER): Payer: Medicaid Other | Admitting: Nurse Practitioner

## 2020-04-01 VITALS — Ht 68.0 in | Wt >= 6400 oz

## 2020-04-01 DIAGNOSIS — G4733 Obstructive sleep apnea (adult) (pediatric): Secondary | ICD-10-CM | POA: Diagnosis not present

## 2020-04-01 DIAGNOSIS — Z7189 Other specified counseling: Secondary | ICD-10-CM | POA: Diagnosis not present

## 2020-04-01 DIAGNOSIS — E11622 Type 2 diabetes mellitus with other skin ulcer: Secondary | ICD-10-CM | POA: Diagnosis not present

## 2020-04-01 DIAGNOSIS — I25119 Atherosclerotic heart disease of native coronary artery with unspecified angina pectoris: Secondary | ICD-10-CM

## 2020-04-01 DIAGNOSIS — E118 Type 2 diabetes mellitus with unspecified complications: Secondary | ICD-10-CM | POA: Diagnosis not present

## 2020-04-01 DIAGNOSIS — L97909 Non-pressure chronic ulcer of unspecified part of unspecified lower leg with unspecified severity: Secondary | ICD-10-CM

## 2020-04-01 DIAGNOSIS — E785 Hyperlipidemia, unspecified: Secondary | ICD-10-CM | POA: Diagnosis not present

## 2020-04-01 DIAGNOSIS — I10 Essential (primary) hypertension: Secondary | ICD-10-CM | POA: Diagnosis not present

## 2020-04-01 MED ORDER — NITROGLYCERIN 0.4 MG SL SUBL: 0.4000 mg | SUBLINGUAL_TABLET | SUBLINGUAL | 11 refills | Status: DC | PRN

## 2020-04-01 NOTE — Telephone Encounter (Signed)
   Lindsay [name] has provided verbal consent on 04/01/2020 for a virtual visit (video or telephone).   CONSENT FOR VIRTUAL VISIT FOR:  Lindsay [name]  By participating in this virtual visit I agree to the following:  I hereby voluntarily request, consent and authorize [facility] and its employed or contracted physicians, Producer, television/film/video, nurse practitioners or other licensed health care professionals (the Practitioner), to provide me with telemedicine health care services (the "Services") as deemed necessary by the treating Practitioner. I acknowledge and consent to receive the Services by the Practitioner via telemedicine. I understand that the telemedicine visit will involve communicating with the Practitioner through live audiovisual communication technology and the disclosure of certain medical information by electronic transmission. I acknowledge that I have been given the opportunity to request an in-person assessment or other available alternative prior to the telemedicine visit and am voluntarily participating in the telemedicine visit.  I understand that I have the right to withhold or withdraw my consent to the use of telemedicine in the course of my care at any time, without affecting my right to future care or treatment, and that the Practitioner or I may terminate the telemedicine visit at any time. I understand that I have the right to inspect all information obtained and/or recorded in the course of the telemedicine visit and may receive copies of available information for a reasonable fee.  I understand that some of the potential risks of receiving the Services via telemedicine include:  Marland Kitchen Delay or interruption in medical evaluation due to technological equipment failure or disruption; . Information transmitted may not be sufficient (e.g. poor resolution of images) to allow for appropriate medical decision making by the Practitioner; and/or  . In rare instances, security  protocols could fail, causing a breach of personal health information.  Furthermore, I acknowledge that it is my responsibility to provide information about my medical history, conditions and care that is complete and accurate to the best of my ability. I acknowledge that Practitioner's advice, recommendations, and/or decision may be based on factors not within their control, such as incomplete or inaccurate data provided by me or distortions of diagnostic images or specimens that may result from electronic transmissions. I understand that the practice of medicine is not an exact science and that Practitioner makes no warranties or guarantees regarding treatment outcomes. I acknowledge that a copy of this consent can be made available to me via my patient portal [facility] MyChart), or I can request a printed copy by calling the office of [facility].    I understand that my insurance will be billed for this visit.   I have read or had this consent read to me. . I understand the contents of this consent, which adequately explains the benefits and risks of the Services being provided via telemedicine.  . I have been provided ample opportunity to ask questions regarding this consent and the Services and have had my questions answered to my satisfaction. . I give my informed consent for the services to be provided through the use of telemedicine in my medical care

## 2020-04-01 NOTE — Telephone Encounter (Signed)
  Patient Consent for Virtual Visit    Lindsay [name] has provided verbal consent on 04/01/2020 for a virtual visit (video or telephone).   CONSENT FOR VIRTUAL VISIT FOR:  Lindsay [name]  By participating in this virtual visit I agree to the following:  I hereby voluntarily request, consent and authorize [facility] and its employed or contracted physicians, Producer, television/film/video, nurse practitioners or other licensed health care professionals (the Practitioner), to provide me with telemedicine health care services (the "Services") as deemed necessary by the treating Practitioner. I acknowledge and consent to receive the Services by the Practitioner via telemedicine. I understand that the telemedicine visit will involve communicating with the Practitioner through live audiovisual communication technology and the disclosure of certain medical information by electronic transmission. I acknowledge that I have been given the opportunity to request an in-person assessment or other available alternative prior to the telemedicine visit and am voluntarily participating in the telemedicine visit.  I understand that I have the right to withhold or withdraw my consent to the use of telemedicine in the course of my care at any time, without affecting my right to future care or treatment, and that the Practitioner or I may terminate the telemedicine visit at any time. I understand that I have the right to inspect all information obtained and/or recorded in the course of the telemedicine visit and may receive copies of available information for a reasonable fee.  I understand that some of the potential risks of receiving the Services via telemedicine include:  Marland Kitchen Delay or interruption in medical evaluation due to technological equipment failure or disruption; . Information transmitted may not be sufficient (e.g. poor resolution of images) to allow for appropriate medical decision making by the Practitioner;  and/or  . In rare instances, security protocols could fail, causing a breach of personal health information.  Furthermore, I acknowledge that it is my responsibility to provide information about my medical history, conditions and care that is complete and accurate to the best of my ability. I acknowledge that Practitioner's advice, recommendations, and/or decision may be based on factors not within their control, such as incomplete or inaccurate data provided by me or distortions of diagnostic images or specimens that may result from electronic transmissions. I understand that the practice of medicine is not an exact science and that Practitioner makes no warranties or guarantees regarding treatment outcomes. I acknowledge that a copy of this consent can be made available to me via my patient portal [facility] MyChart), or I can request a printed copy by calling the office of [facility].    I understand that my insurance will be billed for this visit.   I have read or had this consent read to me. . I understand the contents of this consent, which adequately explains the benefits and risks of the Services being provided via telemedicine.  . I have been provided ample opportunity to ask questions regarding this consent and the Services and have had my questions answered to my satisfaction. . I give my informed consent for the services to be provided through the use of telemedicine in my medical care

## 2020-04-01 NOTE — Patient Instructions (Addendum)
After Visit Summary:  We will be checking the following labs today - NONE   Medication Instructions:    Continue with your current medicines.   NTG was refilled today.    If you need a refill on your cardiac medications before your next appointment, please call your pharmacy.     Testing/Procedures To Be Arranged:  N/A  Follow-Up:   See someone in the office in next week or so - for EKG and in person visit.     At [facility], you and your health needs are our priority.  As part of our continuing mission to provide you with exceptional heart care, we have created designated Provider Care Teams.  These Care Teams include your primary Cardiologist (physician) and Advanced Practice Providers (APPs -  Physician Assistants and Nurse Practitioners) who all work together to provide you with the care you need, when you need it.  Special Instructions:  . Stay safe, stay home, wash your hands for at least 20 seconds and wear a mask when out in public.  . It was good to talk with you today.    Call the [facility] office at [phone] if you have any questions, problems or concerns.

## 2020-04-16 ENCOUNTER — Ambulatory Visit: Payer: Medicaid Other | Admitting: Podiatry

## 2020-04-16 ENCOUNTER — Encounter: Payer: Self-pay | Admitting: Podiatry

## 2020-04-16 ENCOUNTER — Other Ambulatory Visit: Payer: Self-pay

## 2020-04-16 VITALS — Temp 97.6°F

## 2020-04-16 DIAGNOSIS — M79675 Pain in left toe(s): Secondary | ICD-10-CM | POA: Diagnosis not present

## 2020-04-16 DIAGNOSIS — M79674 Pain in right toe(s): Secondary | ICD-10-CM

## 2020-04-16 DIAGNOSIS — B351 Tinea unguium: Secondary | ICD-10-CM

## 2020-04-16 DIAGNOSIS — L600 Ingrowing nail: Secondary | ICD-10-CM | POA: Diagnosis not present

## 2020-04-16 DIAGNOSIS — E1149 Type 2 diabetes mellitus with other diabetic neurological complication: Secondary | ICD-10-CM

## 2020-04-16 MED ORDER — DOXYCYCLINE HYCLATE 100 MG PO CAPS: 100.0000 mg | ORAL_CAPSULE | Freq: Two times a day (BID) | ORAL | 0 refills | Status: DC

## 2020-04-16 NOTE — Patient Instructions (Addendum)
EPSOM SALT FOOT SOAK INSTRUCTIONS  Shopping List:  A. Plain epsom salt (not scented) B. Neosporin Cream/Ointment or Bacitracin Cream/Ointment C. 1-inch fabric band-aids   1.  Place 1/4 cup of epsom salts in 2 quarts of warm tap water. IF YOU ARE DIABETIC, OR HAVE NEUROPATHY, CHECK THE TEMPERATURE OF THE WATER WITH YOUR ELBOW.  2.  Submerge your foot/feet in the solution and soak for 10-15 minutes.      3.  Next, remove your foot or feet from solution, blot dry the affected area.    4.  Apply antibiotic ointment and cover with fabric band-aid .  5.  This soak should be done once a day for 7 days.   6.  Monitor for any signs/symptoms of infection such as redness, swelling, odor, drainage, increased pain, or non-healing of digit.   7.  Please do not hesitate to call the office and speak to a Nurse or Doctor if you have questions.   8.  If you experience fever, chills, nightsweats, nausea or vomiting with worsening of digit, please go to the emergency room.    Peripheral Neuropathy Peripheral neuropathy is a type of nerve damage. It affects nerves that carry signals between the spinal cord and the arms, legs, and the rest of the body (peripheral nerves). It does not affect nerves in the spinal cord or brain. In peripheral neuropathy, one nerve or a group of nerves may be damaged. Peripheral neuropathy is a broad category that includes many specific nerve disorders, like diabetic neuropathy, hereditary neuropathy, and carpal tunnel syndrome. What are the causes? This condition may be caused by:  Diabetes. This is the most common cause of peripheral neuropathy.  Nerve injury.  Pressure or stress on a nerve that lasts a long time.  Lack (deficiency) of B vitamins. This can result from alcoholism, poor diet, or a restricted diet.  Infections.  Autoimmune diseases, such as rheumatoid arthritis and systemic lupus erythematosus.  Nerve diseases that are passed from parent to child  (inherited).  Some medicines, such as cancer medicines (chemotherapy).  Poisonous (toxic) substances, such as lead and mercury.  Too little blood flowing to the legs.  Kidney disease.  Thyroid disease. In some cases, the cause of this condition is not known. What are the signs or symptoms? Symptoms of this condition depend on which of your nerves is damaged. Common symptoms include:  Loss of feeling (numbness) in the feet, hands, or both.  Tingling in the feet, hands, or both.  Burning pain.  Very sensitive skin.  Weakness.  Not being able to move a part of the body (paralysis).  Muscle twitching.  Clumsiness or poor coordination.  Loss of balance.  Not being able to control your bladder.  Feeling dizzy.  Sexual problems. How is this diagnosed? Diagnosing and finding the cause of peripheral neuropathy can be difficult. Your health care provider will take your medical history and do a physical exam. A neurological exam will also be done. This involves checking things that are affected by your brain, spinal cord, and nerves (nervous system). For example, your health care provider will check your reflexes, how you move, and what you can feel. You may have other tests, such as:  Blood tests.  Electromyogram (EMG) and nerve conduction tests. These tests check nerve function and how well the nerves are controlling the muscles.  Imaging tests, such as CT scans or MRI to rule out other causes of your symptoms.  Removing a small piece of nerve   to be examined in a lab (nerve biopsy). This is rare.  Removing and examining a small amount of the fluid that surrounds the brain and spinal cord (lumbar puncture). This is rare. How is this treated? Treatment for this condition may involve:  Treating the underlying cause of the neuropathy, such as diabetes, kidney disease, or vitamin deficiencies.  Stopping medicines that can cause neuropathy, such as chemotherapy.  Medicine  to relieve pain. Medicines may include: ? Prescription or over-the-counter pain medicine. ? Antiseizure medicine. ? Antidepressants. ? Pain-relieving patches that are applied to painful areas of skin.  Surgery to relieve pressure on a nerve or to destroy a nerve that is causing pain.  Physical therapy to help improve movement and balance.  Devices to help you move around (assistive devices). Follow these instructions at home: Medicines  Take over-the-counter and prescription medicines only as told by your health care provider. Do not take any other medicines without first asking your health care provider.  Do not drive or use heavy machinery while taking prescription pain medicine. Lifestyle   Do not use any products that contain nicotine or tobacco, such as cigarettes and e-cigarettes. Smoking keeps blood from reaching damaged nerves. If you need help quitting, ask your health care provider.  Avoid or limit alcohol. Too much alcohol can cause a vitamin B deficiency, and vitamin B is needed for healthy nerves.  Eat a healthy diet. This includes: ? Eating foods that are high in fiber, such as fresh fruits and vegetables, whole grains, and beans. ? Limiting foods that are high in fat and processed sugars, such as fried or sweet foods. General instructions   If you have diabetes, work closely with your health care provider to keep your blood sugar under control.  If you have numbness in your feet: ? Check every day for signs of injury or infection. Watch for redness, warmth, and swelling. ? Wear padded socks and comfortable shoes. These help protect your feet.  Develop a good support system. Living with peripheral neuropathy can be stressful. Consider talking with a mental health specialist or joining a support group.  Use assistive devices and attend physical therapy as told by your health care provider. This may include using a walker or a cane.  Keep all follow-up visits as  told by your health care provider. This is important. Contact a health care provider if:  You have new signs or symptoms of peripheral neuropathy.  You are struggling emotionally from dealing with peripheral neuropathy.  Your pain is not well-controlled. Get help right away if:  You have an injury or infection that is not healing normally.  You develop new weakness in an arm or leg.  You fall frequently. Summary  Peripheral neuropathy is when the nerves in the arms, or legs are damaged, resulting in numbness, weakness, or pain.  There are many causes of peripheral neuropathy, including diabetes, pinched nerves, vitamin deficiencies, autoimmune disease, and hereditary conditions.  Diagnosing and finding the cause of peripheral neuropathy can be difficult. Your health care provider will take your medical history, do a physical exam, and do tests, including blood tests and nerve function tests.  Treatment involves treating the underlying cause of the neuropathy and taking medicines to help control pain. Physical therapy and assistive devices may also help. This information is not intended to replace advice given to you by your health care provider. Make sure you discuss any questions you have with your health care provider. Document Revised: 11/19/2017 Document Reviewed:   02/15/2017 Elsevier Patient Education  2020 [employer]  

## 2020-04-18 NOTE — Progress Notes (Signed)
Subjective: Lindsay [name] presents to clinic today for preventative diabetic foot care and painful mycotic nails b/l that are difficult to trim. Pain interferes with ambulation. Aggravating factors include wearing enclosed shoe gear. Pain is relieved with periodic professional debridement  Patient also presents with cc of painful, ingrown toenail of b/l borders  L hallux. Pt states her daughter stepped on her left great toe several times, most recently last night.  Pain has been present for  Attempted treatments include: None  Past Medical History:  Diagnosis Date  . ARF (acute renal failure) ([place]) 04/2015  . Asthma   . Cellulitis of right upper extremity   . Coronary artery disease   . Diabetes mellitus    insulin dependent  . Hyperlipidemia LDL goal <70   . Hypertension   . NSTEMI (non-ST elevated myocardial infarction) ([place]) 08/2016  . Obesity   . S/P angioplasty with stent 08/2016   DES to mLAD and PTCA only to 2nd diag ostium.      Past Surgical History:  Procedure Laterality Date  . CARDIAC CATHETERIZATION N/A 09/07/2016   Procedure: Left Heart Cath and Coronary Angiography;  Surgeon: [name], MD;  Location: [facility];  Service: Cardiovascular;  Laterality: N/A;  . CARDIAC CATHETERIZATION N/A 09/07/2016   Procedure: Coronary Stent Intervention;  Surgeon: [name], MD;  Location: [facility];  Service: Cardiovascular;  Laterality: N/A;  . CARDIAC CATHETERIZATION N/A 09/07/2016   Procedure: Coronary Balloon Angioplasty;  Surgeon: [name], MD;  Location: [facility];  Service: Cardiovascular;  Laterality: N/A;  . CESAREAN SECTION    . IRRIGATION AND DEBRIDEMENT SHOULDER Right 04/30/2015   Procedure: IRRIGATION AND DEBRIDEMENT SHOULDER;  Surgeon: Renette Butters, MD;  Location: [facility];  Service: Orthopedics;  Laterality: Right;  . IRRIGATION AND DEBRIDEMENT SHOULDER Right 05/01/2015  . LEG SURGERY    . SHOULDER ARTHROSCOPY Right 04/30/2015    Procedure: ARTHROSCOPY SHOULDER;  Surgeon: Renette Butters, MD;  Location: [facility];  Service: Orthopedics;  Laterality: Right;  . TONSILLECTOMY       Current Outpatient Medications on File Prior to Visit  Medication Sig Dispense Refill  . Accu-Chek FastClix Lancets MISC USE 1 TO CHECK GLUCOSE 4 TIMES DAILY 102 each 2  . albuterol (VENTOLIN HFA) 108 (90 Base) MCG/ACT inhaler INHALE 2 PUFFS BY MOUTH EVERY 6 HOURS AS NEEDED FOR WHEEZING FOR SHORTNESS OF BREATH 18 g 2  . amLODipine (NORVASC) 10 MG tablet Take 1 tablet (10 mg total) by mouth daily. 90 tablet 0  . APPLE CIDER VINEGAR PO Take by mouth.    Marland Kitchen aspirin 81 MG tablet Take 1 tablet (81 mg total) by mouth daily. 30 tablet 11  . aspirin-acetaminophen-caffeine (EXCEDRIN MIGRAINE) 122-449-75 MG tablet Take 2 tablets by mouth every 6 (six) hours as needed for headache.    Marland Kitchen atorvastatin (LIPITOR) 40 MG tablet Take 1 tablet (40 mg total) by mouth daily at 6 PM. Please call and schedule November/December follow up. [phone] 90 tablet 1  . b complex vitamins tablet Take 1 tablet by mouth daily.    . baclofen (LIORESAL) 10 MG tablet Take 1 tablet (10 mg total) by mouth 3 (three) times daily as needed for muscle spasms. 30 tablet 3  . BD VEO INSULIN SYRINGE U/F 31G X 15/64" 1 ML MISC USE  SYRINGE ONCE DAILY 100 each 1  . Blood Glucose Monitoring Suppl (ACCU-CHEK AVIVA PLUS) w/Device KIT 1 each by Does not  apply route as directed. 1 kit 0  . [place] Calcium-Magnesium-Vit D [phone] MG-MG-UNIT CAPS Take 1 tablet by mouth daily.    . diclofenac Sodium (VOLTAREN) 1 % GEL Apply topically.    . diphenhydrAMINE (SOMINEX) 25 MG tablet Take by mouth.    . DULoxetine (CYMBALTA) 30 MG capsule Take 1 capsule by mouth once daily 90 capsule 1  . empagliflozin (JARDIANCE) 10 MG TABS tablet Take 10 mg by mouth daily. 30 tablet 3  . ezetimibe (ZETIA) 10 MG tablet Take 1 tablet (10 mg total) by mouth daily. 90 tablet 3  . fluticasone (FLOVENT HFA) 110 MCG/ACT  inhaler Inhale 2 puffs into the lungs 2 (two) times daily. 1 Inhaler 12  . gabapentin (NEURONTIN) 300 MG capsule Take 2 capsules (600 mg total) by mouth 3 (three) times daily. 180 capsule 0  . glucose blood (ACCU-CHEK AVIVA PLUS) test strip Use as instructed to test blood sugar 4 times daily. 100 each 12  . hydrochlorothiazide (HYDRODIURIL) 25 MG tablet Take 1 tablet (25 mg total) by mouth every morning. 90 tablet 1  . hydrocortisone 2.5 % ointment Apply topically for rash on face up to twice daily as needed.    Marland Kitchen ibuprofen (ADVIL,MOTRIN) 200 MG tablet Take 400 mg by mouth every 6 (six) hours as needed for fever or moderate pain.    . Insulin Pen Needle (B-D UF III MINI PEN NEEDLES) 31G X 5 MM MISC Use as instructed. Monitor blood glucose levels three times per day 90 each 1  . insulin regular human CONCENTRATED (HUMULIN R) 500 UNIT/ML injection INJECT 70 UNITS 30 MINUTES BEFORE EACH MEAL 20 mL 3  . LANTUS SOLOSTAR 100 UNIT/ML Solostar Pen INJECT 50 UNITS SUBCUTANEOUSLY AT BEDTIME 15 mL 2  . liraglutide (VICTOZA) 18 MG/3ML SOPN Inject 0.46m daily x1 week then 1.254mdaily x1 week then 1.55m75maily and continue    . losartan (COZAAR) 100 MG tablet Take 1 tablet (100 mg total) by mouth daily. 90 tablet 3  . Melatonin 10 MG TABS Take 3 tablets by mouth at bedtime. 30 tablet 2  . methocarbamol (ROBAXIN) 750 MG tablet Take 1 tablet (750 mg total) by mouth at bedtime. 30 tablet 3  . metoprolol tartrate (LOPRESSOR) 50 MG tablet Take 1 tablet by mouth twice daily 60 tablet 2  . Multiple Vitamins-Minerals (MULTIVITAMIN PO) Take 1 tablet by mouth daily.    . mupirocin ointment (BACTROBAN) 2 % Apply twice daily to the affected area x 7 days or until healed 22 g 1  . nitroGLYCERIN (NITROSTAT) 0.4 MG SL tablet Place 1 tablet (0.4 mg total) under the tongue every 5 (five) minutes as needed for chest pain. 25 tablet 11  . silver sulfADIAZINE (SILVADENE) 1 % cream Apply 1 application topically 2 (two) times daily.  400 g 0  . sodium chloride (OCEAN) 0.65 % nasal spray Place into the nose.     No current facility-administered medications on file prior to visit.     Allergies  Allergen Reactions  . Hydrazine Yellow [Tartrazine] Shortness Of Breath and Swelling    Swelling mostly noticed in legs and feet, retaining urination, shortness of breaht, and minor chest pain  . Lisinopril Shortness Of Breath    Was on prinzide; had sob/chest pain on it.  . Tylenol [Acetaminophen] Itching and Swelling    Itching of the mouth, swelling of tongue and stomach started hurting     Family History  Problem Relation Age of Onset  . Diabetes Mother   .  Hypertension Mother   . Heart disease Mother   . Diabetes Father   . Heart disease Father   . Stroke Maternal Grandmother   . Cancer Maternal Grandmother     Social History   Occupational History  . Not on file  Tobacco Use  . Smoking status: Never Smoker  . Smokeless tobacco: Never Used  Substance and Sexual Activity  . Alcohol use: Yes    Comment: socially  . Drug use: No  . Sexual activity: Not on file      Immunization History  Administered Date(s) Administered  . Pneumococcal Polysaccharide-23 02/13/2015  . Tdap 07/28/2016     Objective: Vitals:   04/16/20 1059  Temp: 97.6 F (36.4 C)    Patient is a pleasant 39 y.o. female  IN NAD. AAO X 3.  Vascular Examination: Capillary fill time to digits <3 seconds b/l. Palpable DP pulses b/l. Faintly palpable PT pulses b/l. Pedal hair sparse b/l. Skin temperature gradient within normal limits b/l. Nonpitting edema noted b/l LE.  Dermatological Examination: Pedal skin with normal turgor, texture and tone bilaterally. No open wounds bilaterally. No interdigital macerations bilaterally. Toenails 2-5 bilaterally and R hallux elongated, dystrophic, thickened, and crumbly with subungual debris and tenderness to dorsal palpation. Left hallux nailplate with subungual hematoma and onycholysis. There is  tenderness to palpation. No erythema, no edema, no drainage, no flocculence. She is unable to tolerate debridement with nail nippers.  Musculoskeletal Examination: Normal muscle strength 5/5 to all lower extremity muscle groups bilaterally. No gross bony deformities bilaterally. No pain crepitus or joint limitation noted with ROM b/l. Patient ambulates independent of any assistive aids.  Neurological Examination: Protective sensation decreased with 10 gram monofilament b/l.  Assessment: Pain due to onychomycosis of toenails of both feet  Ingrown toenail without infection  Type II diabetes mellitus with neurological manifestations ([place])  Plan: 1. Discussed diagnosis of with treatment options.  2. Patient/POA agreed to have temporary total nail avulsion of left hallux nailplate. 3. Consent form signed and in chart 4. Prepped L hallux with betadine solution. 5. Local injection of 3 cc's 50/50 mix of 2% Lidocaine plain and 0.5% Bupivicaine plain administered via hallux block. Offending nail plate freed utilizing elevator. Offending nail plate removed with hemostat. Nailbed cleansed with alcohol. Light bleeding controlled with Lumicain Hemostatic Solution. Light dressng applied. 6. Discused post-procedure instructions of epsom salt warm water soaks and dispensed written handout to patient/POA. 7. Rx for doxycycline 100 mg sent to pharmacy: 1 capsule po bid x 7 days. 8. Remaining toenails x 9 debrided in length and girth with sterile nail nippers without complication. 9. Patient/POA related understanding of post-procedure instructions 10. Follow up 2 weeks. Call office if there is increased redness, swelling, drainage, odor or pain.  Return in about 2 weeks (around 04/30/2020) for left nail check.

## 2020-04-18 NOTE — H&P (View-Only) (Signed)
Cardiology Office Note:    Date:  04/19/2020   ID:  Lindsay [name], DOB 11/01/1981, MRN [id number]  PCP:  Edwards, Michelle P, NP  Cardiologist:  Henry W Smith III, MD  Electrophysiologist:  None   Referring MD: Edwards, Michelle P, NP   Chief Complaint:  Chest Pain    Patient Profile:    Lindsay [name] is a 38 y.o. female with:   Coronary artery disease   S/p NSTEMI 9/17 >> DES to LAD, POBA to D2  Ischemic CM >> EF recovered  EF 35-45 by LV gram during MI  Echocardiogram 9/17: EF 55-60, ant-sept, ant, apical HK  Morbid obesity  Diabetes mellitus   Hypertension   Hyperlipidemia   Palpitations   Prior CV studies: Echocardiogram 09/08/16 Mild conc LVH, EF 55-60, ant-sept, ant, apical HK, trivial MR, normal RVSF  Cardiac catheterization 09/07/16 LAD mid 100; D2 ost 75 LCx prox 30; OM1 50, 50 EF 35-45 PCI: 3 x 18 mm Xience Alpine DES to mid LAD; POBA to D2  History of Present Illness:    Lindsay [name] was seen by [name], NP 04/01/2020 via Telemedicine.  She complained of chest pain and was scheduled for an in person visit for further evaluation.   She is here alone today.  She has had chest discomfort off and on since her myocardial infarction.  She has been enrolled in the bariatric program at [facility].  She needs to lose about 100 pounds before she can proceed with surgery.  She has recently tried to increase her activity.  When she goes for long walks, she develops left-sided chest discomfort described as sharp.  She has no radiating or associated symptoms.  Her chest discomfort does remind her somewhat of her symptoms prior to her myocardial infarction.  She does note some worsening over the past couple of months.  She has to rest to make her symptoms go away.  She has taken nitroglycerin with relief in the past.  She has noted somewhat worsening shortness of breath.  She has not had syncope.  She does note bilateral lower extremity edema.  Past Medical  History:  Diagnosis Date  . ARF (acute renal failure) (HCC) 04/2015  . Asthma   . Cellulitis of right upper extremity   . Coronary artery disease   . Diabetes mellitus    insulin dependent  . Hyperlipidemia LDL goal <70   . Hypertension   . NSTEMI (non-ST elevated myocardial infarction) (HCC) 08/2016  . Obesity   . S/P angioplasty with stent 08/2016   DES to mLAD and PTCA only to 2nd diag ostium.     Current Medications: Current Meds  Medication Sig  . Accu-Chek FastClix Lancets MISC USE 1 TO CHECK GLUCOSE 4 TIMES DAILY  . albuterol (VENTOLIN HFA) 108 (90 Base) MCG/ACT inhaler INHALE 2 PUFFS BY MOUTH EVERY 6 HOURS AS NEEDED FOR WHEEZING FOR SHORTNESS OF BREATH  . amLODipine (NORVASC) 10 MG tablet Take 1 tablet (10 mg total) by mouth daily.  . APPLE CIDER VINEGAR PO Take by mouth.  . aspirin 81 MG tablet Take 1 tablet (81 mg total) by mouth daily.  . aspirin-acetaminophen-caffeine (EXCEDRIN MIGRAINE) 250-250-65 MG tablet Take 2 tablets by mouth every 6 (six) hours as needed for headache.  . atorvastatin (LIPITOR) 40 MG tablet Take 1 tablet (40 mg total) by mouth daily at 6 PM. Please call and schedule November/December follow up. [phone]  . b complex vitamins tablet Take 1 tablet by mouth daily.  .   Cardiology Office Note:    Date:  04/19/2020   ID:  Lindsay [name], DOB 01/17/1981, MRN [id number]  PCP:  Edwards, Michelle P, NP  Cardiologist:  Henry W Smith III, MD  Electrophysiologist:  None   Referring MD: Edwards, Michelle P, NP   Chief Complaint:  Chest Pain    Patient Profile:    Lindsay [name] is a 38 y.o. female with:   Coronary artery disease   S/p NSTEMI 9/17 >> DES to LAD, POBA to D2  Ischemic CM >> EF recovered  EF 35-45 by LV gram during MI  Echocardiogram 9/17: EF 55-60, ant-sept, ant, apical HK  Morbid obesity  Diabetes mellitus   Hypertension   Hyperlipidemia   Palpitations   Prior CV studies: Echocardiogram 09/08/16 Mild conc LVH, EF 55-60, ant-sept, ant, apical HK, trivial MR, normal RVSF  Cardiac catheterization 09/07/16 LAD mid 100; D2 ost 75 LCx prox 30; OM1 50, 50 EF 35-45 PCI: 3 x 18 mm Xience Alpine DES to mid LAD; POBA to D2  History of Present Illness:    Lindsay [name] was seen by [name], NP 04/01/2020 via Telemedicine.  She complained of chest pain and was scheduled for an in person visit for further evaluation.   She is here alone today.  She has had chest discomfort off and on since her myocardial infarction.  She has been enrolled in the bariatric program at [facility].  She needs to lose about 100 pounds before she can proceed with surgery.  She has recently tried to increase her activity.  When she goes for long walks, she develops left-sided chest discomfort described as sharp.  She has no radiating or associated symptoms.  Her chest discomfort does remind her somewhat of her symptoms prior to her myocardial infarction.  She does note some worsening over the past couple of months.  She has to rest to make her symptoms go away.  She has taken nitroglycerin with relief in the past.  She has noted somewhat worsening shortness of breath.  She has not had syncope.  She does note bilateral lower extremity edema.  Past Medical  History:  Diagnosis Date  . ARF (acute renal failure) (HCC) 04/2015  . Asthma   . Cellulitis of right upper extremity   . Coronary artery disease   . Diabetes mellitus    insulin dependent  . Hyperlipidemia LDL goal <70   . Hypertension   . NSTEMI (non-ST elevated myocardial infarction) (HCC) 08/2016  . Obesity   . S/P angioplasty with stent 08/2016   DES to mLAD and PTCA only to 2nd diag ostium.     Current Medications: Current Meds  Medication Sig  . Accu-Chek FastClix Lancets MISC USE 1 TO CHECK GLUCOSE 4 TIMES DAILY  . albuterol (VENTOLIN HFA) 108 (90 Base) MCG/ACT inhaler INHALE 2 PUFFS BY MOUTH EVERY 6 HOURS AS NEEDED FOR WHEEZING FOR SHORTNESS OF BREATH  . amLODipine (NORVASC) 10 MG tablet Take 1 tablet (10 mg total) by mouth daily.  . APPLE CIDER VINEGAR PO Take by mouth.  . aspirin 81 MG tablet Take 1 tablet (81 mg total) by mouth daily.  . aspirin-acetaminophen-caffeine (EXCEDRIN MIGRAINE) 250-250-65 MG tablet Take 2 tablets by mouth every 6 (six) hours as needed for headache.  . atorvastatin (LIPITOR) 40 MG tablet Take 1 tablet (40 mg total) by mouth daily at 6 PM. Please call and schedule November/December follow up. [phone]  . b complex vitamins tablet Take 1 tablet by mouth daily.  .   Cardiology Office Note:    Date:  04/19/2020   ID:  Lindsay [name], DOB 11/01/1981, MRN [id number]  PCP:  Edwards, Michelle P, NP  Cardiologist:  Henry W Smith III, MD  Electrophysiologist:  None   Referring MD: Edwards, Michelle P, NP   Chief Complaint:  Chest Pain    Patient Profile:    Lindsay [name] is a 38 y.o. female with:   Coronary artery disease   S/p NSTEMI 9/17 >> DES to LAD, POBA to D2  Ischemic CM >> EF recovered  EF 35-45 by LV gram during MI  Echocardiogram 9/17: EF 55-60, ant-sept, ant, apical HK  Morbid obesity  Diabetes mellitus   Hypertension   Hyperlipidemia   Palpitations   Prior CV studies: Echocardiogram 09/08/16 Mild conc LVH, EF 55-60, ant-sept, ant, apical HK, trivial MR, normal RVSF  Cardiac catheterization 09/07/16 LAD mid 100; D2 ost 75 LCx prox 30; OM1 50, 50 EF 35-45 PCI: 3 x 18 mm Xience Alpine DES to mid LAD; POBA to D2  History of Present Illness:    Lindsay [name] was seen by [name], NP 04/01/2020 via Telemedicine.  She complained of chest pain and was scheduled for an in person visit for further evaluation.   She is here alone today.  She has had chest discomfort off and on since her myocardial infarction.  She has been enrolled in the bariatric program at [facility].  She needs to lose about 100 pounds before she can proceed with surgery.  She has recently tried to increase her activity.  When she goes for long walks, she develops left-sided chest discomfort described as sharp.  She has no radiating or associated symptoms.  Her chest discomfort does remind her somewhat of her symptoms prior to her myocardial infarction.  She does note some worsening over the past couple of months.  She has to rest to make her symptoms go away.  She has taken nitroglycerin with relief in the past.  She has noted somewhat worsening shortness of breath.  She has not had syncope.  She does note bilateral lower extremity edema.  Past Medical  History:  Diagnosis Date  . ARF (acute renal failure) (HCC) 04/2015  . Asthma   . Cellulitis of right upper extremity   . Coronary artery disease   . Diabetes mellitus    insulin dependent  . Hyperlipidemia LDL goal <70   . Hypertension   . NSTEMI (non-ST elevated myocardial infarction) (HCC) 08/2016  . Obesity   . S/P angioplasty with stent 08/2016   DES to mLAD and PTCA only to 2nd diag ostium.     Current Medications: Current Meds  Medication Sig  . Accu-Chek FastClix Lancets MISC USE 1 TO CHECK GLUCOSE 4 TIMES DAILY  . albuterol (VENTOLIN HFA) 108 (90 Base) MCG/ACT inhaler INHALE 2 PUFFS BY MOUTH EVERY 6 HOURS AS NEEDED FOR WHEEZING FOR SHORTNESS OF BREATH  . amLODipine (NORVASC) 10 MG tablet Take 1 tablet (10 mg total) by mouth daily.  . APPLE CIDER VINEGAR PO Take by mouth.  . aspirin 81 MG tablet Take 1 tablet (81 mg total) by mouth daily.  . aspirin-acetaminophen-caffeine (EXCEDRIN MIGRAINE) 250-250-65 MG tablet Take 2 tablets by mouth every 6 (six) hours as needed for headache.  . atorvastatin (LIPITOR) 40 MG tablet Take 1 tablet (40 mg total) by mouth daily at 6 PM. Please call and schedule November/December follow up. [phone]  . b complex vitamins tablet Take 1 tablet by mouth daily.  .   Cardiology Office Note:    Date:  04/19/2020   ID:  Lindsay [name], DOB 11/01/1981, MRN [id number]  PCP:  Edwards, Michelle P, NP  Cardiologist:  Henry W Smith III, MD  Electrophysiologist:  None   Referring MD: Edwards, Michelle P, NP   Chief Complaint:  Chest Pain    Patient Profile:    Lindsay [name] is a 38 y.o. female with:   Coronary artery disease   S/p NSTEMI 9/17 >> DES to LAD, POBA to D2  Ischemic CM >> EF recovered  EF 35-45 by LV gram during MI  Echocardiogram 9/17: EF 55-60, ant-sept, ant, apical HK  Morbid obesity  Diabetes mellitus   Hypertension   Hyperlipidemia   Palpitations   Prior CV studies: Echocardiogram 09/08/16 Mild conc LVH, EF 55-60, ant-sept, ant, apical HK, trivial MR, normal RVSF  Cardiac catheterization 09/07/16 LAD mid 100; D2 ost 75 LCx prox 30; OM1 50, 50 EF 35-45 PCI: 3 x 18 mm Xience Alpine DES to mid LAD; POBA to D2  History of Present Illness:    Lindsay [name] was seen by [name], NP 04/01/2020 via Telemedicine.  She complained of chest pain and was scheduled for an in person visit for further evaluation.   She is here alone today.  She has had chest discomfort off and on since her myocardial infarction.  She has been enrolled in the bariatric program at [facility].  She needs to lose about 100 pounds before she can proceed with surgery.  She has recently tried to increase her activity.  When she goes for long walks, she develops left-sided chest discomfort described as sharp.  She has no radiating or associated symptoms.  Her chest discomfort does remind her somewhat of her symptoms prior to her myocardial infarction.  She does note some worsening over the past couple of months.  She has to rest to make her symptoms go away.  She has taken nitroglycerin with relief in the past.  She has noted somewhat worsening shortness of breath.  She has not had syncope.  She does note bilateral lower extremity edema.  Past Medical  History:  Diagnosis Date  . ARF (acute renal failure) (HCC) 04/2015  . Asthma   . Cellulitis of right upper extremity   . Coronary artery disease   . Diabetes mellitus    insulin dependent  . Hyperlipidemia LDL goal <70   . Hypertension   . NSTEMI (non-ST elevated myocardial infarction) (HCC) 08/2016  . Obesity   . S/P angioplasty with stent 08/2016   DES to mLAD and PTCA only to 2nd diag ostium.     Current Medications: Current Meds  Medication Sig  . Accu-Chek FastClix Lancets MISC USE 1 TO CHECK GLUCOSE 4 TIMES DAILY  . albuterol (VENTOLIN HFA) 108 (90 Base) MCG/ACT inhaler INHALE 2 PUFFS BY MOUTH EVERY 6 HOURS AS NEEDED FOR WHEEZING FOR SHORTNESS OF BREATH  . amLODipine (NORVASC) 10 MG tablet Take 1 tablet (10 mg total) by mouth daily.  . APPLE CIDER VINEGAR PO Take by mouth.  . aspirin 81 MG tablet Take 1 tablet (81 mg total) by mouth daily.  . aspirin-acetaminophen-caffeine (EXCEDRIN MIGRAINE) 250-250-65 MG tablet Take 2 tablets by mouth every 6 (six) hours as needed for headache.  . atorvastatin (LIPITOR) 40 MG tablet Take 1 tablet (40 mg total) by mouth daily at 6 PM. Please call and schedule November/December follow up. [phone]  . b complex vitamins tablet Take 1 tablet by mouth daily.  .

## 2020-04-18 NOTE — Progress Notes (Signed)
Cardiology Office Note:    Date:  04/19/2020   ID:  Lindsay [name], DOB 03/31/1981, MRN [id number]  PCP:  Edwards, Michelle P, NP  Cardiologist:  Henry W Smith III, MD  Electrophysiologist:  None   Referring MD: Edwards, Michelle P, NP   Chief Complaint:  Chest Pain    Patient Profile:    Lindsay [name] is a 38 y.o. female with:   Coronary artery disease   S/p NSTEMI 9/17 >> DES to LAD, POBA to D2  Ischemic CM >> EF recovered  EF 35-45 by LV gram during MI  Echocardiogram 9/17: EF 55-60, ant-sept, ant, apical HK  Morbid obesity  Diabetes mellitus   Hypertension   Hyperlipidemia   Palpitations   Prior CV studies: Echocardiogram 09/08/16 Mild conc LVH, EF 55-60, ant-sept, ant, apical HK, trivial MR, normal RVSF  Cardiac catheterization 09/07/16 LAD mid 100; D2 ost 75 LCx prox 30; OM1 50, 50 EF 35-45 PCI: 3 x 18 mm Xience Alpine DES to mid LAD; POBA to D2  History of Present Illness:    [name] was seen by [name], NP 04/01/2020 via Telemedicine.  She complained of chest pain and was scheduled for an in person visit for further evaluation.   She is here alone today.  She has had chest discomfort off and on since her myocardial infarction.  She has been enrolled in the bariatric program at [facility].  She needs to lose about 100 pounds before she can proceed with surgery.  She has recently tried to increase her activity.  When she goes for long walks, she develops left-sided chest discomfort described as sharp.  She has no radiating or associated symptoms.  Her chest discomfort does remind her somewhat of her symptoms prior to her myocardial infarction.  She does note some worsening over the past couple of months.  She has to rest to make her symptoms go away.  She has taken nitroglycerin with relief in the past.  She has noted somewhat worsening shortness of breath.  She has not had syncope.  She does note bilateral lower extremity edema.  Past Medical  History:  Diagnosis Date  . ARF (acute renal failure) (HCC) 04/2015  . Asthma   . Cellulitis of right upper extremity   . Coronary artery disease   . Diabetes mellitus    insulin dependent  . Hyperlipidemia LDL goal <70   . Hypertension   . NSTEMI (non-ST elevated myocardial infarction) (HCC) 08/2016  . Obesity   . S/P angioplasty with stent 08/2016   DES to mLAD and PTCA only to 2nd diag ostium.     Current Medications: Current Meds  Medication Sig  . Accu-Chek FastClix Lancets MISC USE 1 TO CHECK GLUCOSE 4 TIMES DAILY  . albuterol (VENTOLIN HFA) 108 (90 Base) MCG/ACT inhaler INHALE 2 PUFFS BY MOUTH EVERY 6 HOURS AS NEEDED FOR WHEEZING FOR SHORTNESS OF BREATH  . amLODipine (NORVASC) 10 MG tablet Take 1 tablet (10 mg total) by mouth daily.  . APPLE CIDER VINEGAR PO Take by mouth.  . aspirin 81 MG tablet Take 1 tablet (81 mg total) by mouth daily.  . aspirin-acetaminophen-caffeine (EXCEDRIN MIGRAINE) 250-250-65 MG tablet Take 2 tablets by mouth every 6 (six) hours as needed for headache.  . atorvastatin (LIPITOR) 40 MG tablet Take 1 tablet (40 mg total) by mouth daily at 6 PM. Please call and schedule November/December follow up. [phone]  . b complex vitamins tablet Take 1 tablet by mouth daily.  .   baclofen (LIORESAL) 10 MG tablet Take 1 tablet (10 mg total) by mouth 3 (three) times daily as needed for muscle spasms.  . BD VEO INSULIN SYRINGE U/F 31G X 15/64" 1 ML MISC USE  SYRINGE ONCE DAILY  . Blood Glucose Monitoring Suppl (ACCU-CHEK AVIVA PLUS) w/Device KIT 1 each by Does not apply route as directed.  . Coral Calcium-Magnesium-Vit D [id number] MG-MG-UNIT CAPS Take 1 tablet by mouth daily.  . diclofenac Sodium (VOLTAREN) 1 % GEL Apply topically.  . diphenhydrAMINE (SOMINEX) 25 MG tablet Take by mouth.  . doxycycline (VIBRAMYCIN) 100 MG capsule Take 1 capsule (100 mg total) by mouth 2 (two) times daily for 7 days.  . DULoxetine (CYMBALTA) 30 MG capsule Take 1 capsule by mouth  once daily  . empagliflozin (JARDIANCE) 10 MG TABS tablet Take 10 mg by mouth daily.  Marland Kitchen ezetimibe (ZETIA) 10 MG tablet Take 1 tablet (10 mg total) by mouth daily.  . fluticasone (FLOVENT HFA) 110 MCG/ACT inhaler Inhale 2 puffs into the lungs 2 (two) times daily.  Marland Kitchen gabapentin (NEURONTIN) 300 MG capsule Take 2 capsules (600 mg total) by mouth 3 (three) times daily.  Marland Kitchen glucose blood (ACCU-CHEK AVIVA PLUS) test strip Use as instructed to test blood sugar 4 times daily.  . hydrochlorothiazide (HYDRODIURIL) 25 MG tablet Take 1 tablet (25 mg total) by mouth every morning.  . hydrocortisone 2.5 % ointment Apply topically for rash on face up to twice daily as needed.  Marland Kitchen ibuprofen (ADVIL,MOTRIN) 200 MG tablet Take 400 mg by mouth every 6 (six) hours as needed for fever or moderate pain.  . Insulin Pen Needle (B-D UF III MINI PEN NEEDLES) 31G X 5 MM MISC Use as instructed. Monitor blood glucose levels three times per day  . insulin regular human CONCENTRATED (HUMULIN R) 500 UNIT/ML injection INJECT 70 UNITS 30 MINUTES BEFORE EACH MEAL  . LANTUS SOLOSTAR 100 UNIT/ML Solostar Pen INJECT 50 UNITS SUBCUTANEOUSLY AT BEDTIME  . liraglutide (VICTOZA) 18 MG/3ML SOPN Inject 0.52m daily x1 week then 1.242mdaily x1 week then 1.72m15maily and continue  . losartan (COZAAR) 100 MG tablet Take 1 tablet (100 mg total) by mouth daily.  . Melatonin 10 MG TABS Take 3 tablets by mouth at bedtime.  . methocarbamol (ROBAXIN) 750 MG tablet Take 1 tablet (750 mg total) by mouth at bedtime.  . metoprolol tartrate (LOPRESSOR) 50 MG tablet Take 1 tablet by mouth twice daily  . Multiple Vitamins-Minerals (MULTIVITAMIN PO) Take 1 tablet by mouth daily.  . mupirocin ointment (BACTROBAN) 2 % Apply twice daily to the affected area x 7 days or until healed  . nitroGLYCERIN (NITROSTAT) 0.4 MG SL tablet Place 1 tablet (0.4 mg total) under the tongue every 5 (five) minutes as needed for chest pain.  . silver sulfADIAZINE (SILVADENE) 1 % cream  Apply 1 application topically 2 (two) times daily.  . sodium chloride (OCEAN) 0.65 % nasal spray Place into the nose.     Allergies:   Hydrazine yellow [tartrazine], Lisinopril, and Tylenol [acetaminophen]   Social History   Tobacco Use  . Smoking status: Never Smoker  . Smokeless tobacco: Never Used  Substance Use Topics  . Alcohol use: Yes    Comment: socially  . Drug use: No     Family Hx: The patient's family history includes Cancer in her maternal grandmother; Diabetes in her father and mother; Heart disease in her father and mother; Hypertension in her mother; Stroke in her maternal grandmother.  ROS  EKGs/Labs/Other Test Reviewed:    EKG:  EKG is  ordered today.  The ekg ordered today demonstrates normal sinus rhythm, heart rate 86, normal axis, QTC 478, no ST-T wave changes  Recent Labs: No results found for requested labs within last 8760 hours.   Recent Lipid Panel Lab Results  Component Value Date/Time   CHOL 138 12/05/2019 11:07 AM   TRIG 134 12/05/2019 11:07 AM   HDL 47 12/05/2019 11:07 AM   CHOLHDL 2.9 12/05/2019 11:07 AM   CHOLHDL 3 04/12/2018 10:06 AM   LDLCALC 67 12/05/2019 11:07 AM    Physical Exam:    VS:  BP 134/83   Pulse 86   Ht 5' 7.5" (1.715 m)   Wt (!) 419 lb (190.1 kg)   BMI 64.66 kg/m     Wt Readings from Last 3 Encounters:  04/19/20 (!) 419 lb (190.1 kg)  04/01/20 (!) 435 lb 12.8 oz (197.7 kg)  12/05/19 (!) 434 lb (196.9 kg)     Constitutional:      Appearance: Healthy appearance. Not in distress.  HENT:     Head: Normocephalic and atraumatic.  Neck:     Thyroid: No thyromegaly.     Vascular: JVD normal.  Pulmonary:     Breath sounds: No wheezing. No rales.  Cardiovascular:     Regular rhythm. Normal S1. Normal S2.     Murmurs: There is no murmur.  Edema:    Peripheral edema absent.  Abdominal:     Palpations: Abdomen is soft.     Tenderness: There is no abdominal tenderness.  Musculoskeletal: Normal range of motion.  Skin:    General: Skin is warm and dry.  Neurological:     General: No focal deficit present.     Mental Status: Alert and oriented to person, place and time.  Psychiatric:        Mood and Affect: Affect normal.      ASSESSMENT & PLAN:    1. Coronary artery disease involving native coronary artery of native heart with angina pectoris (HCC) History of non-ST elevation myocardial infarction in 2017 treated the drug-eluting stent to the LAD and balloon angioplasty to the second diagonal.  Over the last couple of months, she has noted worsening exertional chest discomfort somewhat reminiscent of her previous angina.  Her ECG is unchanged.  Her size precludes the ability to proceed with nuclear stress testing.  Given the nature of her symptoms, I have recommended proceeding with cardiac catheterization to evaluate for ischemia.  I reviewed this with Dr. Skains (attending MD) who agreed.  Risks and benefits of cardiac catheterization have been discussed with the patient.  These include bleeding, infection, kidney damage, stroke, heart attack, death.  The patient understands these risks and is willing to proceed.  Continue current dose of amlodipine, aspirin, atorvastatin, ezetimibe, losartan, metoprolol tartrate.  Start isosorbide mononitrate 30 mg daily.  Obtain c-Met, CBC today.  Proceed with cardiac catheterization next week with Dr. Smith.  2. Essential hypertension Blood pressure somewhat borderline.  Add isosorbide as noted.  Continue current dose of amlodipine, losartan, metoprolol tartrate, hydrochlorothiazide.  3. Mixed hyperlipidemia LDL optimal in December 2020.  Obtain follow-up c-Met today.    Dispo:  Return for Post Procedure Follow Up.   Medication Adjustments/Labs and Tests Ordered: Current medicines are reviewed at length with the patient today.  Concerns regarding medicines are outlined above.  Tests Ordered: Orders Placed This Encounter  Procedures  . Comprehensive metabolic  panel  . CBC  .   EKG 12-Lead   Medication Changes: Meds ordered this encounter  Medications  . isosorbide mononitrate (IMDUR) 30 MG 24 hr tablet    Sig: Take 1 tablet (30 mg total) by mouth daily.    Dispense:  30 tablet    Refill:  11    Order Specific Question:   Supervising Provider    Answer:   CRENSHAW, BRIAN S [[id number]    Signed, Scott Weaver, PA-C  04/19/2020 12:49 PM    [facility] [address], [place],   [address] Phone: [phone]; Fax: [phone]    

## 2020-04-19 ENCOUNTER — Other Ambulatory Visit: Payer: Self-pay

## 2020-04-19 ENCOUNTER — Ambulatory Visit: Payer: Medicaid Other | Admitting: Physician Assistant

## 2020-04-19 ENCOUNTER — Encounter: Payer: Self-pay | Admitting: Physician Assistant

## 2020-04-19 VITALS — BP 134/83 | HR 86 | Ht 67.5 in | Wt >= 6400 oz

## 2020-04-19 DIAGNOSIS — I10 Essential (primary) hypertension: Secondary | ICD-10-CM | POA: Diagnosis not present

## 2020-04-19 DIAGNOSIS — E782 Mixed hyperlipidemia: Secondary | ICD-10-CM | POA: Diagnosis not present

## 2020-04-19 DIAGNOSIS — Z7689 Persons encountering health services in other specified circumstances: Secondary | ICD-10-CM | POA: Diagnosis not present

## 2020-04-19 DIAGNOSIS — I25119 Atherosclerotic heart disease of native coronary artery with unspecified angina pectoris: Secondary | ICD-10-CM | POA: Diagnosis not present

## 2020-04-19 MED ORDER — ISOSORBIDE MONONITRATE ER 30 MG PO TB24: 30.0000 mg | ORAL_TABLET | Freq: Every day | ORAL | 11 refills | Status: DC

## 2020-04-19 NOTE — Patient Instructions (Addendum)
Medication Instructions:   Your physician has recommended you make the following change in your medication:   1) Start Isosorbide 30 mg, 1 tablet by mouth once a day  *If you need a refill on your cardiac medications before your next appointment, please call your pharmacy*  Lab Work:  You will have labs drawn today: CMET/CBC  If you have labs (blood work) drawn today and your tests are completely normal, you will receive your results only by: Marland Kitchen MyChart Message (if you have MyChart) OR . A paper copy in the mail If you have any lab test that is abnormal or we need to change your treatment, we will call you to review the results.  Testing/Procedures:  Your Pre-procedure COVID-19 Testing will be done on  at [address] [place], on 04/22/20 at 12:50PM. After your swab you will be given a mask to wear and instructed to go home and quarantine you are not allowed to have visitors until after your procedure. If you test positive you will be notified and your procedure will be cancelled.    Your physician has requested that you have a cardiac catheterization. Cardiac catheterization is used to diagnose and/or treat various heart conditions. Doctors may recommend this procedure for a number of different reasons. The most common reason is to evaluate chest pain. Chest pain can be a symptom of coronary artery disease (CAD), and cardiac catheterization can show whether plaque is narrowing or blocking your heart's arteries. This procedure is also used to evaluate the valves, as well as measure the blood flow and oxygen levels in different parts of your heart. For further information please visit [email or web address] Please follow instruction sheet, as given.  Follow-Up: At [facility], you and your health needs are our priority.  As part of our continuing mission to provide you with exceptional heart care, we have created designated Provider Care Teams.  These Care Teams include your  primary Cardiologist (physician) and Advanced Practice Providers (APPs -  Physician Assistants and Nurse Practitioners) who all work together to provide you with the care you need, when you need it.  We recommend signing up for the patient portal called "MyChart".  Sign up information is provided on this After Visit Summary.  MyChart is used to connect with patients for Virtual Visits (Telemedicine).  Patients are able to view lab/test results, encounter notes, upcoming appointments, etc.  Non-urgent messages can be sent to your provider as well.   To learn more about what you can do with MyChart, go to [email or web address].    Your next appointment:    On 05/13/20 at 11:15 with Tereso Newcomer, PA-C

## 2020-04-20 DIAGNOSIS — G4733 Obstructive sleep apnea (adult) (pediatric): Secondary | ICD-10-CM | POA: Diagnosis not present

## 2020-04-20 LAB — COMPREHENSIVE METABOLIC PANEL
ALT: 52 IU/L — ABNORMAL HIGH (ref 0–32)
AST: 54 IU/L — ABNORMAL HIGH (ref 0–40)
Albumin/Globulin Ratio: 1.2 (ref 1.2–2.2)
Albumin: 4.4 g/dL (ref 3.8–4.8)
Alkaline Phosphatase: 266 IU/L — ABNORMAL HIGH (ref 39–117)
BUN/Creatinine Ratio: 12 (ref 9–23)
BUN: 12 mg/dL (ref 6–20)
Bilirubin Total: 0.3 mg/dL (ref 0.0–1.2)
CO2: 26 mmol/L (ref 20–29)
Calcium: 10.7 mg/dL — ABNORMAL HIGH (ref 8.7–10.2)
Chloride: 99 mmol/L (ref 96–106)
Creatinine, Ser: 1.04 mg/dL — ABNORMAL HIGH (ref 0.57–1.00)
GFR calc Af Amer: 79 mL/min/{1.73_m2} (ref >59)
GFR calc non Af Amer: 68 mL/min/{1.73_m2} (ref >59)
Globulin, Total: 3.8 g/dL (ref 1.5–4.5)
Glucose: 177 mg/dL — ABNORMAL HIGH (ref 65–99)
Potassium: 4.7 mmol/L (ref 3.5–5.2)
Sodium: 140 mmol/L (ref 134–144)
Total Protein: 8.2 g/dL (ref 6.0–8.5)

## 2020-04-20 LAB — CBC
Hematocrit: 45.1 % (ref 34.0–46.6)
Hemoglobin: 14.1 g/dL (ref 11.1–15.9)
MCH: 26.3 pg — ABNORMAL LOW (ref 26.6–33.0)
MCHC: 31.3 g/dL — ABNORMAL LOW (ref 31.5–35.7)
MCV: 84 fL (ref 79–97)
Platelets: 367 10*3/uL (ref 150–450)
RBC: 5.37 x10E6/uL — ABNORMAL HIGH (ref 3.77–5.28)
RDW: 14.5 % (ref 11.7–15.4)
WBC: 9 10*3/uL (ref 3.4–10.8)

## 2020-04-22 ENCOUNTER — Other Ambulatory Visit (HOSPITAL_COMMUNITY)
Admission: RE | Admit: 2020-04-22 | Discharge: 2020-04-22 | Disposition: A | Discharge status: Home / Self Care | Payer: Medicaid Other | Source: Ambulatory Visit | Attending: Interventional Cardiology | Admitting: Interventional Cardiology

## 2020-04-22 DIAGNOSIS — Z01812 Encounter for preprocedural laboratory examination: Secondary | ICD-10-CM | POA: Insufficient documentation

## 2020-04-22 DIAGNOSIS — Z20822 Contact with and (suspected) exposure to covid-19: Secondary | ICD-10-CM | POA: Diagnosis not present

## 2020-04-22 LAB — SARS CORONAVIRUS 2 (TAT 6-24 HRS): SARS Coronavirus 2: NEGATIVE

## 2020-04-23 ENCOUNTER — Telehealth: Payer: Self-pay | Admitting: *Deleted

## 2020-04-23 NOTE — Telephone Encounter (Signed)
Pt contacted pre-catheterization scheduled at [facility] for: Thursday Apr 25, 2020 7:30 AM Verified arrival time and place: [facility]) at: 5:30 AM  No solid food after midnight prior to cath, clear liquids until 5 AM day of procedure.  Hold: Insulin-AM of procedure Jardiance - AM of procedure 1/2 Lantus Insulin-HS prior to procedure Victoza-1/2 usual HS dose prior to procedure  Except hold medications AM meds can be  taken pre-cath with sip of water including: ASA 81 mg   Confirmed patient has responsible adult to drive home post procedure and observe 24 hours after arriving home: yes  You are allowed ONE visitor in the waiting room during your procedure. Both you and your visitor must wear masks.      COVID-19 Pre-Screening Questions:  . In the past 7 to 10 days have you had a cough,  shortness of breath, headache, congestion, fever (100 or greater) body aches, chills, sore throat, or sudden loss of taste or sense of smell? no . Have you been around anyone with known Covid 19 in the past 7 to 10 days? no . Have you been around anyone who is awaiting Covid 19 test results in the past 7 to 10 days?no . Have you been around anyone who has mentioned symptoms of Covid 19 within the past 7 to 10 days? no   Reviewed procedure/mask/visitor instructions, COVID-19 screening questions with patient.

## 2020-04-24 DIAGNOSIS — G4733 Obstructive sleep apnea (adult) (pediatric): Secondary | ICD-10-CM | POA: Diagnosis not present

## 2020-04-25 ENCOUNTER — Ambulatory Visit (HOSPITAL_COMMUNITY)
Admission: RE | Admit: 2020-04-25 | Discharge: 2020-04-25 | Disposition: A | Discharge status: Home / Self Care | Payer: Medicaid Other | Attending: Interventional Cardiology | Admitting: Interventional Cardiology

## 2020-04-25 ENCOUNTER — Encounter (HOSPITAL_COMMUNITY)
Admission: RE | Disposition: A | Discharge status: Home / Self Care | Payer: Self-pay | Source: Home / Self Care | Attending: Interventional Cardiology

## 2020-04-25 ENCOUNTER — Other Ambulatory Visit: Payer: Self-pay

## 2020-04-25 DIAGNOSIS — E118 Type 2 diabetes mellitus with unspecified complications: Secondary | ICD-10-CM | POA: Diagnosis present

## 2020-04-25 DIAGNOSIS — Z7982 Long term (current) use of aspirin: Secondary | ICD-10-CM | POA: Diagnosis not present

## 2020-04-25 DIAGNOSIS — E119 Type 2 diabetes mellitus without complications: Secondary | ICD-10-CM | POA: Insufficient documentation

## 2020-04-25 DIAGNOSIS — Z79899 Other long term (current) drug therapy: Secondary | ICD-10-CM | POA: Insufficient documentation

## 2020-04-25 DIAGNOSIS — Z6841 Body Mass Index (BMI) 40.0 and over, adult: Secondary | ICD-10-CM | POA: Insufficient documentation

## 2020-04-25 DIAGNOSIS — Z886 Allergy status to analgesic agent status: Secondary | ICD-10-CM | POA: Insufficient documentation

## 2020-04-25 DIAGNOSIS — E782 Mixed hyperlipidemia: Secondary | ICD-10-CM | POA: Diagnosis not present

## 2020-04-25 DIAGNOSIS — G4733 Obstructive sleep apnea (adult) (pediatric): Secondary | ICD-10-CM | POA: Diagnosis present

## 2020-04-25 DIAGNOSIS — R079 Chest pain, unspecified: Secondary | ICD-10-CM

## 2020-04-25 DIAGNOSIS — I10 Essential (primary) hypertension: Secondary | ICD-10-CM | POA: Diagnosis not present

## 2020-04-25 DIAGNOSIS — I25118 Atherosclerotic heart disease of native coronary artery with other forms of angina pectoris: Secondary | ICD-10-CM | POA: Diagnosis not present

## 2020-04-25 DIAGNOSIS — I255 Ischemic cardiomyopathy: Secondary | ICD-10-CM | POA: Insufficient documentation

## 2020-04-25 DIAGNOSIS — Z794 Long term (current) use of insulin: Secondary | ICD-10-CM | POA: Insufficient documentation

## 2020-04-25 DIAGNOSIS — I252 Old myocardial infarction: Secondary | ICD-10-CM | POA: Diagnosis not present

## 2020-04-25 DIAGNOSIS — Z955 Presence of coronary angioplasty implant and graft: Secondary | ICD-10-CM | POA: Diagnosis not present

## 2020-04-25 DIAGNOSIS — Z7951 Long term (current) use of inhaled steroids: Secondary | ICD-10-CM | POA: Insufficient documentation

## 2020-04-25 DIAGNOSIS — J45909 Unspecified asthma, uncomplicated: Secondary | ICD-10-CM | POA: Diagnosis not present

## 2020-04-25 DIAGNOSIS — Z888 Allergy status to other drugs, medicaments and biological substances status: Secondary | ICD-10-CM | POA: Insufficient documentation

## 2020-04-25 DIAGNOSIS — I25119 Atherosclerotic heart disease of native coronary artery with unspecified angina pectoris: Secondary | ICD-10-CM

## 2020-04-25 HISTORY — PX: LEFT HEART CATH AND CORONARY ANGIOGRAPHY: CATH118249

## 2020-04-25 LAB — GLUCOSE, CAPILLARY: Glucose-Capillary: 152 mg/dL — ABNORMAL HIGH (ref 70–99)

## 2020-04-25 LAB — PREGNANCY, URINE: Preg Test, Ur: NEGATIVE

## 2020-04-25 SURGERY — LEFT HEART CATH AND CORONARY ANGIOGRAPHY
Anesthesia: LOCAL

## 2020-04-25 MED ORDER — LIDOCAINE HCL (PF) 1 % IJ SOLN
INTRAMUSCULAR | Status: AC
  Filled 2020-04-25: qty 30

## 2020-04-25 MED ORDER — FENTANYL CITRATE (PF) 100 MCG/2ML IJ SOLN
INTRAMUSCULAR | Status: DC | PRN
  Administered 2020-04-25: 25 ug via INTRAVENOUS

## 2020-04-25 MED ORDER — SODIUM CHLORIDE 0.9 % WEIGHT BASED INFUSION
3.0000 mL/kg/h | INTRAVENOUS | Status: AC
  Administered 2020-04-25: 3 mL/kg/h via INTRAVENOUS

## 2020-04-25 MED ORDER — HEPARIN (PORCINE) IN NACL 1000-0.9 UT/500ML-% IV SOLN
INTRAVENOUS | Status: AC
  Filled 2020-04-25: qty 1000

## 2020-04-25 MED ORDER — OXYCODONE HCL 5 MG PO TABS
5.0000 mg | ORAL_TABLET | ORAL | Status: DC | PRN
  Administered 2020-04-25: 5 mg via ORAL
  Filled 2020-04-25: qty 1

## 2020-04-25 MED ORDER — ACETAMINOPHEN 325 MG PO TABS: 650.0000 mg | ORAL_TABLET | ORAL | Status: DC | PRN

## 2020-04-25 MED ORDER — HEPARIN (PORCINE) IN NACL 1000-0.9 UT/500ML-% IV SOLN
INTRAVENOUS | Status: DC | PRN
  Administered 2020-04-25 (×2): 500 mL

## 2020-04-25 MED ORDER — HEPARIN SODIUM (PORCINE) 1000 UNIT/ML IJ SOLN
INTRAMUSCULAR | Status: DC | PRN
  Administered 2020-04-25: 9500 [IU] via INTRAVENOUS

## 2020-04-25 MED ORDER — VERAPAMIL HCL 2.5 MG/ML IV SOLN
INTRAVENOUS | Status: AC
  Filled 2020-04-25: qty 2

## 2020-04-25 MED ORDER — HYDRALAZINE HCL 20 MG/ML IJ SOLN: 10.0000 mg | INTRAMUSCULAR | Status: DC | PRN

## 2020-04-25 MED ORDER — SODIUM CHLORIDE 0.9 % WEIGHT BASED INFUSION: 1.0000 mL/kg/h | INTRAVENOUS | Status: DC

## 2020-04-25 MED ORDER — IOHEXOL 350 MG/ML SOLN
INTRAVENOUS | Status: DC | PRN
  Administered 2020-04-25: 90 mL

## 2020-04-25 MED ORDER — LIDOCAINE HCL (PF) 1 % IJ SOLN
INTRAMUSCULAR | Status: DC | PRN
  Administered 2020-04-25: 2 mL via INTRADERMAL

## 2020-04-25 MED ORDER — SODIUM CHLORIDE 0.9% FLUSH: 3.0000 mL | INTRAVENOUS | Status: DC | PRN

## 2020-04-25 MED ORDER — SODIUM CHLORIDE 0.9% FLUSH: 3.0000 mL | Freq: Two times a day (BID) | INTRAVENOUS | Status: DC

## 2020-04-25 MED ORDER — FENTANYL CITRATE (PF) 100 MCG/2ML IJ SOLN
INTRAMUSCULAR | Status: AC
  Filled 2020-04-25: qty 2

## 2020-04-25 MED ORDER — MIDAZOLAM HCL 2 MG/2ML IJ SOLN
INTRAMUSCULAR | Status: DC | PRN
  Administered 2020-04-25 (×2): 1 mg via INTRAVENOUS

## 2020-04-25 MED ORDER — SODIUM CHLORIDE 0.9 % IV SOLN: 250.0000 mL | INTRAVENOUS | Status: DC | PRN

## 2020-04-25 MED ORDER — HEPARIN SODIUM (PORCINE) 1000 UNIT/ML IJ SOLN
INTRAMUSCULAR | Status: AC
  Filled 2020-04-25: qty 1

## 2020-04-25 MED ORDER — ONDANSETRON HCL 4 MG/2ML IJ SOLN: 4.0000 mg | Freq: Four times a day (QID) | INTRAMUSCULAR | Status: DC | PRN

## 2020-04-25 MED ORDER — ASPIRIN 81 MG PO CHEW: 81.0000 mg | CHEWABLE_TABLET | ORAL | Status: DC

## 2020-04-25 MED ORDER — VERAPAMIL HCL 2.5 MG/ML IV SOLN
INTRAVENOUS | Status: DC | PRN
  Administered 2020-04-25: 08:00:00 10 mL via INTRA_ARTERIAL

## 2020-04-25 MED ORDER — MIDAZOLAM HCL 2 MG/2ML IJ SOLN
INTRAMUSCULAR | Status: AC
  Filled 2020-04-25: qty 2

## 2020-04-25 MED ORDER — LABETALOL HCL 5 MG/ML IV SOLN: 10.0000 mg | INTRAVENOUS | Status: DC | PRN

## 2020-04-25 MED ORDER — ASPIRIN 81 MG PO CHEW: 81.0000 mg | CHEWABLE_TABLET | Freq: Every day | ORAL | Status: DC

## 2020-04-25 SURGICAL SUPPLY — 10 items
CATH 5FR JL3.5 JR4 ANG PIG MP (CATHETERS) ×2 IMPLANT
DEVICE RAD COMP TR BAND LRG (VASCULAR PRODUCTS) ×2 IMPLANT
GLIDESHEATH SLEND A-KIT 6F 22G (SHEATH) ×2 IMPLANT
GUIDEWIRE INQWIRE 1.5J.035X260 (WIRE) ×1 IMPLANT
INQWIRE 1.5J .035X260CM (WIRE) ×2
KIT HEART LEFT (KITS) ×2 IMPLANT
PACK CARDIAC CATHETERIZATION (CUSTOM PROCEDURE TRAY) ×2 IMPLANT
SHEATH PROBE COVER 6X72 (BAG) ×2 IMPLANT
TRANSDUCER W/STOPCOCK (MISCELLANEOUS) ×2 IMPLANT
TUBING CIL FLEX 10 FLL-RA (TUBING) ×2 IMPLANT

## 2020-04-25 NOTE — Discharge Instructions (Signed)
Please have pharmacist verify the dose of Victoza prior to discharge.    DRINK PLENTY OF FLUIDS OVER THE NEXT 2-3 DAYS. DRINK PLENTY OF FLUIDS OVER THE NEXT 2-3 DAYS. Radial Site Care  This sheet gives you information about how to care for yourself after your procedure. Your health care provider may also give you more specific instructions. If you have problems or questions, contact your health care provider. What can I expect after the procedure? After the procedure, it is common to have:  Bruising and tenderness at the catheter insertion area. Follow these instructions at home: Medicines  Take over-the-counter and prescription medicines only as told by your health care provider. Insertion site care  Follow instructions from your health care provider about how to take care of your insertion site. Make sure you: ? Wash your hands with soap and water before you change your bandage (dressing). If soap and water are not available, use hand sanitizer. ? Change your dressing as told by your health care provider. ? Leave stitches (sutures), skin glue, or adhesive strips in place. These skin closures may need to stay in place for 2 weeks or longer. If adhesive strip edges start to loosen and curl up, you may trim the loose edges. Do not remove adhesive strips completely unless your health care provider tells you to do that.  Check your insertion site every day for signs of infection. Check for: ? Redness, swelling, or pain. ? Fluid or blood. ? Pus or a bad smell. ? Warmth.  Do not take baths, swim, or use a hot tub until your health care provider approves.  You may shower 24-48 hours after the procedure, or as directed by your health care provider. ? Remove the dressing and gently wash the site with plain soap and water. ? Pat the area dry with a clean towel. ? Do not rub the site. That could cause bleeding.  Do not apply powder or lotion to the site. Activity   For 24 hours after  the procedure, or as directed by your health care provider: ? Do not flex or bend the affected arm. ? Do not push or pull heavy objects with the affected arm. ? Do not drive yourself home from the hospital or clinic. You may drive 24 hours after the procedure unless your health care provider tells you not to. ? Do not operate machinery or power tools.  Do not lift anything that is heavier than 10 lb (4.5 kg), or the limit that you are told, until your health care provider says that it is safe.  Ask your health care provider when it is okay to: ? Return to work or school. ? Resume usual physical activities or sports. ? Resume sexual activity. General instructions  If the catheter site starts to bleed, raise your arm and put firm pressure on the site. If the bleeding does not stop, get help right away. This is a medical emergency.  If you went home on the same day as your procedure, a responsible adult should be with you for the first 24 hours after you arrive home.  Keep all follow-up visits as told by your health care provider. This is important. Contact a health care provider if:  You have a fever.  You have redness, swelling, or yellow drainage around your insertion site. Get help right away if:  You have unusual pain at the radial site.  The catheter insertion area swells very fast.  The insertion area is bleeding,  and the bleeding does not stop when you hold steady pressure on the area.  Your arm or hand becomes pale, cool, tingly, or numb. These symptoms may represent a serious problem that is an emergency. Do not wait to see if the symptoms will go away. Get medical help right away. Call your local emergency services (911 in the [place] Do not drive yourself to the hospital. Summary  After the procedure, it is common to have bruising and tenderness at the site.  Follow instructions from your health care provider about how to take care of your radial site wound. Check the  wound every day for signs of infection.  Do not lift anything that is heavier than 10 lb (4.5 kg), or the limit that you are told, until your health care provider says that it is safe. This information is not intended to replace advice given to you by your health care provider. Make sure you discuss any questions you have with your health care provider. Document Revised: 01/12/2018 Document Reviewed: 01/12/2018 Elsevier Patient Education  2020 [employer].

## 2020-04-25 NOTE — Interval H&P Note (Signed)
Cath Lab Visit (complete for each Cath Lab visit)  Clinical Evaluation Leading to the Procedure:   ACS: No.  Non-ACS:    Anginal Classification: CCS II  Anti-ischemic medical therapy: No Therapy  Non-Invasive Test Results: No non-invasive testing performed  Prior CABG: No previous CABG      History and Physical Interval Note:  04/25/2020 7:54 AM  Lindsay [name]  has presented today for surgery, with the diagnosis of CAD with angina.  The various methods of treatment have been discussed with the patient and family. After consideration of risks, benefits and other options for treatment, the patient has consented to  Procedure(s): LEFT HEART CATH AND CORONARY ANGIOGRAPHY (N/A) as a surgical intervention.  The patient's history has been reviewed, patient examined, no change in status, stable for surgery.  I have reviewed the patient's chart and labs.  Questions were answered to the patient's satisfaction.     Lyn Records III

## 2020-04-25 NOTE — CV Procedure (Signed)
   Left heart cath with coronary angiography and left ventriculography via right radial approach  Widely patent left main  Widely patent LAD stent and widely patent jailed diagonal 2.  First diagonal contains segmental ostial to proximal 60% narrowed.  Difficult to lay out.  Circumflex is patent.  First marginal contains ostial and proximal 50% narrowing.  Circumflex beyond the first obtuse marginal origin contains 40% narrowing.  Right coronary is dominant.  Luminal irregularities are noted.  LV function is normal.  Impression is that of chest discomfort which is predominantly sharp pain occurring randomly and exertional dyspnea or not related to significant obstructive disease.  The sharp pain could be musculoskeletal/inflammatory, GI, or other etiology.  Dyspnea on exertion is likely related to obesity, deconditioning, and perhaps a component of diastolic heart failure.  Plan is to continue medical therapy.

## 2020-04-26 ENCOUNTER — Other Ambulatory Visit: Payer: Self-pay | Admitting: Interventional Cardiology

## 2020-05-03 ENCOUNTER — Encounter: Payer: Self-pay | Admitting: Podiatry

## 2020-05-03 ENCOUNTER — Ambulatory Visit: Payer: Medicaid Other | Admitting: Podiatry

## 2020-05-03 ENCOUNTER — Other Ambulatory Visit: Payer: Self-pay

## 2020-05-03 VITALS — Temp 97.9°F

## 2020-05-03 DIAGNOSIS — L600 Ingrowing nail: Secondary | ICD-10-CM | POA: Diagnosis not present

## 2020-05-03 DIAGNOSIS — Z7689 Persons encountering health services in other specified circumstances: Secondary | ICD-10-CM | POA: Diagnosis not present

## 2020-05-03 DIAGNOSIS — M79675 Pain in left toe(s): Secondary | ICD-10-CM

## 2020-05-03 NOTE — Patient Instructions (Signed)
Diabetes Mellitus and Foot Care Foot care is an important part of your health, especially when you have diabetes. Diabetes may cause you to have problems because of poor blood flow (circulation) to your feet and legs, which can cause your skin to:  Become thinner and drier.  Break more easily.  Heal more slowly.  Peel and crack. You may also have nerve damage (neuropathy) in your legs and feet, causing decreased feeling in them. This means that you may not notice minor injuries to your feet that could lead to more serious problems. Noticing and addressing any potential problems early is the best way to prevent future foot problems. How to care for your feet Foot hygiene  Wash your feet daily with warm water and mild soap. Do not use hot water. Then, pat your feet and the areas between your toes until they are completely dry. Do not soak your feet as this can dry your skin.  Trim your toenails straight across. Do not dig under them or around the cuticle. File the edges of your nails with an emery board or nail file.  Apply a moisturizing lotion or petroleum jelly to the skin on your feet and to dry, brittle toenails. Use lotion that does not contain alcohol and is unscented. Do not apply lotion between your toes. Shoes and socks  Wear clean socks or stockings every day. Make sure they are not too tight. Do not wear knee-high stockings since they may decrease blood flow to your legs.  Wear shoes that fit properly and have enough cushioning. Always look in your shoes before you put them on to be sure there are no objects inside.  To break in new shoes, wear them for just a few hours a day. This prevents injuries on your feet. Wounds, scrapes, corns, and calluses  Check your feet daily for blisters, cuts, bruises, sores, and redness. If you cannot see the bottom of your feet, use a mirror or ask someone for help.  Do not cut corns or calluses or try to remove them with medicine.  If you  find a minor scrape, cut, or break in the skin on your feet, keep it and the skin around it clean and dry. You may clean these areas with mild soap and water. Do not clean the area with peroxide, alcohol, or iodine.  If you have a wound, scrape, corn, or callus on your foot, look at it several times a day to make sure it is healing and not infected. Check for: ? Redness, swelling, or pain. ? Fluid or blood. ? Warmth. ? Pus or a bad smell. General instructions  Do not cross your legs. This may decrease blood flow to your feet.  Do not use heating pads or hot water bottles on your feet. They may burn your skin. If you have lost feeling in your feet or legs, you may not know this is happening until it is too late.  Protect your feet from hot and cold by wearing shoes, such as at the beach or on hot pavement.  Schedule a complete foot exam at least once a year (annually) or more often if you have foot problems. If you have foot problems, report any cuts, sores, or bruises to your health care provider immediately. Contact a health care provider if:  You have a medical condition that increases your risk of infection and you have any cuts, sores, or bruises on your feet.  You have an injury that is not   healing.  You have redness on your legs or feet.  You feel burning or tingling in your legs or feet.  You have pain or cramps in your legs and feet.  Your legs or feet are numb.  Your feet always feel cold.  You have pain around a toenail. Get help right away if:  You have a wound, scrape, corn, or callus on your foot and: ? You have pain, swelling, or redness that gets worse. ? You have fluid or blood coming from the wound, scrape, corn, or callus. ? Your wound, scrape, corn, or callus feels warm to the touch. ? You have pus or a bad smell coming from the wound, scrape, corn, or callus. ? You have a fever. ? You have a red line going up your leg. Summary  Check your feet every day  for cuts, sores, red spots, swelling, and blisters.  Moisturize feet and legs daily.  Wear shoes that fit properly and have enough cushioning.  If you have foot problems, report any cuts, sores, or bruises to your health care provider immediately.  Schedule a complete foot exam at least once a year (annually) or more often if you have foot problems. This information is not intended to replace advice given to you by your health care provider. Make sure you discuss any questions you have with your health care provider. Document Revised: 08/30/2019 Document Reviewed: 01/08/2017 Elsevier Patient Education  2020 [employer]   Ingrown Toenail An ingrown toenail occurs when the corner or sides of a toenail grow into the surrounding skin. This causes discomfort and pain. The big toe is most commonly affected, but any of the toes can be affected. If an ingrown toenail is not treated, it can become infected. What are the causes? This condition may be caused by: Wearing shoes that are too small or tight. An injury, such as stubbing your toe or having your toe stepped on. Improper cutting or care of your toenails. Having nail or foot abnormalities that were present from birth (congenital abnormalities), such as having a nail that is too big for your toe. What increases the risk? The following factors may make you more likely to develop ingrown toenails: Age. Nails tend to get thicker with age, so ingrown nails are more common among older people. Cutting your toenails incorrectly, such as cutting them very short or cutting them unevenly. An ingrown toenail is more likely to get infected if you have: Diabetes. Blood flow (circulation) problems. What are the signs or symptoms? Symptoms of an ingrown toenail may include: Pain, soreness, or tenderness. Redness. Swelling. Hardening of the skin that surrounds the toenail. Signs that an ingrown toenail may be infected include: Fluid or  pus. Symptoms that get worse instead of better. How is this diagnosed? An ingrown toenail may be diagnosed based on your medical history, your symptoms, and a physical exam. If you have fluid or blood coming from your toenail, a sample may be collected to test for the specific type of bacteria that is causing the infection. How is this treated? Treatment depends on how severe your ingrown toenail is. You may be able to care for your toenail at home. If you have an infection, you may be prescribed antibiotic medicines. If you have fluid or pus draining from your toenail, your health care provider may drain it. If you have trouble walking, you may be given crutches to use. If you have a severe or infected ingrown toenail, you may need a   procedure to remove part or all of the nail. Follow these instructions at home: Foot care  Do not pick at your toenail or try to remove it yourself. Soak your foot in warm, soapy water. Do this for 20 minutes, 3 times a day, or as often as told by your health care provider. This helps to keep your toe clean and keep your skin soft. Wear shoes that fit well and are not too tight. Your health care provider may recommend that you wear open-toed shoes while you heal. Trim your toenails regularly and carefully. Cut your toenails straight across to prevent injury to the skin at the corners of the toenail. Do not cut your nails in a curved shape. Keep your feet clean and dry to help prevent infection. Medicines Take over-the-counter and prescription medicines only as told by your health care provider. If you were prescribed an antibiotic, take it as told by your health care provider. Do not stop taking the antibiotic even if you start to feel better. Activity Return to your normal activities as told by your health care provider. Ask your health care provider what activities are safe for you. Avoid activities that cause pain. General instructions If your health care  provider told you to use crutches to help you move around, use them as instructed. Keep all follow-up visits as told by your health care provider. This is important. Contact a health care provider if: You have more redness, swelling, pain, or other symptoms that do not improve with treatment. You have fluid, blood, or pus coming from your toenail. Get help right away if: You have a red streak on your skin that starts at your foot and spreads up your leg. You have a fever. Summary An ingrown toenail occurs when the corner or sides of a toenail grow into the surrounding skin. This causes discomfort and pain. The big toe is most commonly affected, but any of the toes can be affected. If an ingrown toenail is not treated, it can become infected. Fluid or pus draining from your toenail is a sign of infection. Your health care provider may need to drain it. You may be given antibiotics to treat the infection. Trimming your toenails regularly and properly can help you prevent an ingrown toenail. This information is not intended to replace advice given to you by your health care provider. Make sure you discuss any questions you have with your health care provider. Document Revised: 03/31/2019 Document Reviewed: 08/25/2017 Elsevier Patient Education  2020 [employer]  

## 2020-05-12 NOTE — Progress Notes (Signed)
Cardiology Office Note:    Date:  05/13/2020   ID:  Lindsay [name], DOB May 23, 1981, MRN [id number]  PCP:  [name], NP  Cardiologist:  Sinclair Grooms, MD   Electrophysiologist:  None   Referring MD: [name], NP   Chief Complaint:  Follow-up (s/p cardiac catheterization )    Patient Profile:    Lindsay [name] is a 39 y.o. female with:   Coronary artery disease  ? S/p NSTEMI 9/17 >> DES to LAD, POBA to D2  Ischemic CM >> EF recovered ? EF 35-45 by LV gram during MI ? Echocardiogram 9/17: EF 55-60, ant-sept, ant, apical HK  Morbid obesity  Diabetes mellitus   Hypertension   Hyperlipidemia   Palpitations   Prior CV studies: Cardiac catheterization 05/01/20 LAD mid stent patent w 20 ISR; D1 60, jailed D2 ost patent with 50  LCx prox 30; OM1 50 EF 55-65  Echocardiogram 09/08/16 Mild conc LVH, EF 55-60, ant-sept, ant, apical HK, trivial MR, normal RVSF  Cardiac catheterization 09/07/16 LAD mid 100; D2 ost 75 LCx prox 30; OM1 50, 50 EF 35-45 PCI: 3 x 18 mm Xience [place] DES to mid LAD; POBA to D2  History of Present Illness:    [name] was last seen in clinic 04/19/2020 with symptoms of chest pain concerning for unstable angina.  Cardiac catheterization was arranged.  This demonstrated a patent LAD stent.  There was mod disease in D1 and the jailed D2 was patent.  Med Rx was recommended.  She returns for follow up.  She is here alone.  She has felt well since her cardiac catheterization.  She feels that the isosorbide helped improve her chest discomfort.  However, it has caused some nausea.  She has tried taking it differently without much success.  Her breathing has been stable.  She has not had orthopnea, syncope or significant leg swelling.      Past Medical History:  Diagnosis Date  . ARF (acute renal failure) ([place]) 04/2015  . Asthma   . Cellulitis of right upper extremity   . Coronary artery disease   . Diabetes mellitus    insulin dependent  . Hyperlipidemia LDL goal <70   . Hypertension   . NSTEMI (non-ST elevated myocardial infarction) ([place]) 08/2016  . Obesity   . S/P angioplasty with stent 08/2016   DES to mLAD and PTCA only to 2nd diag ostium.     Current Medications: Current Meds  Medication Sig  . Accu-Chek FastClix Lancets MISC USE 1 TO CHECK GLUCOSE 4 TIMES DAILY  . albuterol (VENTOLIN HFA) 108 (90 Base) MCG/ACT inhaler INHALE 2 PUFFS BY MOUTH EVERY 6 HOURS AS NEEDED FOR WHEEZING FOR SHORTNESS OF BREATH  . amLODipine (NORVASC) 10 MG tablet Take 1 tablet (10 mg total) by mouth daily.  Marland Kitchen aspirin 81 MG tablet Take 1 tablet (81 mg total) by mouth daily.  Marland Kitchen aspirin-acetaminophen-caffeine (EXCEDRIN MIGRAINE) 250-250-65 MG tablet Take 2 tablets by mouth every 6 (six) hours as needed for headache.  Marland Kitchen atorvastatin (LIPITOR) 40 MG tablet Take 80 mg by mouth daily.  . baclofen (LIORESAL) 10 MG tablet Take 1 tablet (10 mg total) by mouth 3 (three) times daily as needed for muscle spasms.  . BD VEO INSULIN SYRINGE U/F 31G X 15/64" 1 ML MISC USE  SYRINGE ONCE DAILY  . Blood Glucose Monitoring Suppl (ACCU-CHEK AVIVA PLUS) w/Device KIT 1 each by Does not apply route as directed.  . diphenhydrAMINE (BENADRYL)  25 MG tablet Take 50 mg by mouth in the morning and at bedtime. Allergies  . DULoxetine (CYMBALTA) 30 MG capsule Take 1 capsule by mouth once daily  . empagliflozin (JARDIANCE) 10 MG TABS tablet Take 10 mg by mouth daily.  . fluticasone (FLOVENT HFA) 110 MCG/ACT inhaler Inhale 2 puffs into the lungs 2 (two) times daily.  Marland Kitchen gabapentin (NEURONTIN) 300 MG capsule Take 2 capsules (600 mg total) by mouth 3 (three) times daily.  Marland Kitchen glucose blood (ACCU-CHEK AVIVA PLUS) test strip Use as instructed to test blood sugar 4 times daily.  . hydrochlorothiazide (HYDRODIURIL) 25 MG tablet Take 1 tablet (25 mg total) by mouth every morning.  Marland Kitchen ibuprofen (ADVIL,MOTRIN) 200 MG tablet Take 400 mg by mouth every 6 (six) hours as  needed for fever or moderate pain.  . Insulin Pen Needle (B-D UF III MINI PEN NEEDLES) 31G X 5 MM MISC Use as instructed. Monitor blood glucose levels three times per day  . insulin regular human CONCENTRATED (HUMULIN R) 500 UNIT/ML injection Inject 50 Units into the skin.  Marland Kitchen isosorbide mononitrate (IMDUR) 30 MG 24 hr tablet Take 0.5 tablets (15 mg total) by mouth daily.  Marland Kitchen LANTUS SOLOSTAR 100 UNIT/ML Solostar Pen INJECT 50 UNITS SUBCUTANEOUSLY AT BEDTIME  . liraglutide (VICTOZA) 18 MG/3ML SOPN Inject 1.8 mg into the skin at bedtime.   Marland Kitchen losartan (COZAAR) 100 MG tablet Take 1 tablet by mouth once daily  . Melatonin 10 MG TABS Take 3 tablets by mouth at bedtime.  . metoprolol tartrate (LOPRESSOR) 100 MG tablet Take 1 tablet (100 mg total) by mouth 2 (two) times daily.  . Multiple Vitamins-Minerals (MULTIVITAMIN PO) Take 1 tablet by mouth daily.  . nitroGLYCERIN (NITROSTAT) 0.4 MG SL tablet Place 1 tablet (0.4 mg total) under the tongue every 5 (five) minutes as needed for chest pain.  . [DISCONTINUED] isosorbide mononitrate (IMDUR) 30 MG 24 hr tablet Take 1 tablet (30 mg total) by mouth daily.  . [DISCONTINUED] Melatonin 10 MG TABS Take 3 tablets by mouth at bedtime.  . [DISCONTINUED] metoprolol tartrate (LOPRESSOR) 50 MG tablet Take 1 tablet by mouth twice daily     Allergies:   Hydrazine yellow [tartrazine], Lisinopril, and Tylenol [acetaminophen]   Social History   Tobacco Use  . Smoking status: Never Smoker  . Smokeless tobacco: Never Used  Substance Use Topics  . Alcohol use: Yes    Comment: socially  . Drug use: No     Family Hx: The patient's family history includes Cancer in her maternal grandmother; Diabetes in her father and mother; Heart disease in her father and mother; Hypertension in her mother; Stroke in her maternal grandmother.  Review of Systems  Gastrointestinal: Positive for nausea.     EKGs/Labs/Other Test Reviewed:    EKG:  EKG is   ordered today.  The ekg  ordered today demonstrates normal sinus rhythm, heart rate 84, normal axis, QTC 482, no change from prior tracing  Recent Labs: 04/19/2020: ALT 52; BUN 12; Creatinine, Ser 1.04; Hemoglobin 14.1; Platelets 367; Potassium 4.7; Sodium 140   Recent Lipid Panel Lab Results  Component Value Date/Time   CHOL 138 12/05/2019 11:07 AM   TRIG 134 12/05/2019 11:07 AM   HDL 47 12/05/2019 11:07 AM   CHOLHDL 2.9 12/05/2019 11:07 AM   CHOLHDL 3 04/12/2018 10:06 AM   LDLCALC 67 12/05/2019 11:07 AM    Physical Exam:    VS:  BP (!) 160/80   Pulse 84   Ht  _0  (1.702 m)   Wt (!) 412 lb 9.6 oz (187.2 kg)   SpO2 94%   BMI 64.62 kg/m     Wt Readings from Last 3 Encounters:  05/13/20 (!) 412 lb 9.6 oz (187.2 kg)  04/25/20 (!) 419 lb (190.1 kg)  04/19/20 (!) 419 lb (190.1 kg)     Constitutional:      Appearance: Healthy appearance. Not in distress.  Pulmonary:     Effort: Pulmonary effort is normal.     Breath sounds: No wheezing. No rales.  Cardiovascular:     Normal rate. Regular rhythm. Normal S1. Normal S2.     Murmurs: There is no murmur.  Edema:    Peripheral edema absent.  Abdominal:     Palpations: Abdomen is soft.  Musculoskeletal:     Cervical back: Neck supple. Skin:    General: Skin is warm and dry.  Neurological:     Mental Status: Alert and oriented to person, place and time.     Cranial Nerves: Cranial nerves are intact.      ASSESSMENT & PLAN:    1. Coronary artery disease involving native coronary artery of native heart with angina pectoris ([place]) History of non-ST elevation myocardial infarction in 2017 treated the drug-eluting stent to the LAD and balloon angioplasty to the second diagonal.  Recent cardiac catheterization demonstrated patent LAD stent and moderate nonobstructive disease elsewhere.  Continued medical therapy has been recommended.  Continue aspirin, amlodipine, atorvastatin, metoprolol.  She has developed some nausea related to isosorbide.  I have asked  her to reduce her isosorbide to 15 mg daily.  If her nausea does not improve, she can stop this medication.  2. Essential hypertension Blood pressure is uncontrolled.  Continue current dose of amlodipine, hydrochlorothiazide, losartan.  Adjust isosorbide as noted above due to side effects.  Increase metoprolol tartrate to 100 mg twice daily.  Follow-up in 3 to 4 months for continued risk factor modification management.  3. Mixed hyperlipidemia LDL optimal on most recent lab work.  Continue current Rx.       Dispo:  Return in about 4 months (around 09/13/2020) for Routine Follow Up w/ Dr. [name], or PA/NP.   Medication Adjustments/Labs and Tests Ordered: Current medicines are reviewed at length with the patient today.  Concerns regarding medicines are outlined above.  Tests Ordered: Orders Placed This Encounter  Procedures  . EKG 12-Lead   Medication Changes: Meds ordered this encounter  Medications  . isosorbide mononitrate (IMDUR) 30 MG 24 hr tablet    Sig: Take 0.5 tablets (15 mg total) by mouth daily.    Dispense:  15 tablet    Refill:  11  . metoprolol tartrate (LOPRESSOR) 100 MG tablet    Sig: Take 1 tablet (100 mg total) by mouth 2 (two) times daily.    Dispense:  60 tablet    Refill:  6    Signed, Richardson Dopp, PA-C  05/13/2020 4:51 PM    [facility] [address], [place], [place]  [address] Phone: [phone]; Fax: [phone]

## 2020-05-12 NOTE — Progress Notes (Signed)
Subjective:  Lindsay [name] presents today s/p temporary total nail avulsion L hallux.  Patient states she has been following post procedure instructions. She states toe is still sore. Her daughter has stepped on her toe a couple of times since her procedure was performed.  Objective: Vitals:   05/03/20 1129  Temp: 97.9 F (36.6 C)    39 y.o. female WD, WN IN NAD. AAO X 3.  Capillary fill time to digits <3 seconds b/l. Palpable DP pulses b/l. Faintly palpable PT pulses b/l. Pedal hair sparse b/l. Skin temperature gradient within normal limits b/l. Nonpitting edema noted b/l LE.  Pedal skin with normal turgor, texture and tone bilaterally. No interdigital macerations bilaterally.   Procedure site noted have residual dried drainage at proximal nail fold. No erythema, no edema, no drainage, no purulence L hallux.  Normal muscle strength 5/5 to all lower extremity muscle groups bilaterally. No gross bony deformities bilaterally. No pain crepitus or joint limitation noted with ROM b/l. Patient ambulates independent of any assistive aids.  Protective sensation decreased with 10 gram monofilament b/l.  Assessment: Status post temporary total nail avulsion left hallux Ingrown toenail left hallux Pain in toe  Plan: -Examined patient. -Curretaged nail borders of left hallux and cleansed with alcohol. TAO and band-aid applied. -Advised her to protect her feet.  -Continue epsom salt soaks for an additional week. After soaks completed, apply antibiotic ointment to toe once daily.  -Patient to continue soft, supportive shoe gear daily. -Patient to report any pedal injuries to medical professional immediately. -Patient/POA to call should there be question/concern in the interim.  Return in about 7 weeks (around 06/21/2020) for diabetic nail trim.

## 2020-05-13 ENCOUNTER — Encounter: Payer: Self-pay | Admitting: Physician Assistant

## 2020-05-13 ENCOUNTER — Other Ambulatory Visit: Payer: Self-pay

## 2020-05-13 ENCOUNTER — Ambulatory Visit: Payer: Medicaid Other | Admitting: Physician Assistant

## 2020-05-13 VITALS — BP 160/80 | HR 84 | Ht 67.0 in | Wt >= 6400 oz

## 2020-05-13 DIAGNOSIS — E782 Mixed hyperlipidemia: Secondary | ICD-10-CM | POA: Diagnosis not present

## 2020-05-13 DIAGNOSIS — I10 Essential (primary) hypertension: Secondary | ICD-10-CM | POA: Diagnosis not present

## 2020-05-13 DIAGNOSIS — I25119 Atherosclerotic heart disease of native coronary artery with unspecified angina pectoris: Secondary | ICD-10-CM

## 2020-05-13 DIAGNOSIS — Z7689 Persons encountering health services in other specified circumstances: Secondary | ICD-10-CM | POA: Diagnosis not present

## 2020-05-13 MED ORDER — METOPROLOL TARTRATE 100 MG PO TABS: 100.0000 mg | ORAL_TABLET | Freq: Two times a day (BID) | ORAL | 6 refills | Status: DC

## 2020-05-13 MED ORDER — ISOSORBIDE MONONITRATE ER 30 MG PO TB24
15.0000 mg | ORAL_TABLET | Freq: Every day | ORAL | 11 refills | Status: DC
Start: 2020-05-13 — End: 2021-05-28

## 2020-05-13 NOTE — Patient Instructions (Signed)
Medication Instructions:   Your physician has recommended you make the following change in your medication:   1) Change Imdur from 30 mg to 15 mg, 0.5 tablet by mouth once a day 2) Increase Metoprolol to 100 mg, 1 tablet by mouth twice a day  *If you need a refill on your cardiac medications before your next appointment, please call your pharmacy*  Lab Work:  None ordered today  Testing/Procedures:  None ordered today  Follow-Up:  On 08/23/20 at 11:20AM with Verdis Prime, MD  Other Instructions  If nausea dose not get better with decreasing Imdur, stop the medication

## 2020-05-21 DIAGNOSIS — G4733 Obstructive sleep apnea (adult) (pediatric): Secondary | ICD-10-CM | POA: Diagnosis not present

## 2020-05-22 DIAGNOSIS — Z794 Long term (current) use of insulin: Secondary | ICD-10-CM | POA: Diagnosis not present

## 2020-05-22 DIAGNOSIS — E1165 Type 2 diabetes mellitus with hyperglycemia: Secondary | ICD-10-CM | POA: Diagnosis not present

## 2020-05-22 DIAGNOSIS — E1140 Type 2 diabetes mellitus with diabetic neuropathy, unspecified: Secondary | ICD-10-CM | POA: Diagnosis not present

## 2020-05-23 DIAGNOSIS — R748 Abnormal levels of other serum enzymes: Secondary | ICD-10-CM | POA: Insufficient documentation

## 2020-05-27 DIAGNOSIS — R748 Abnormal levels of other serum enzymes: Secondary | ICD-10-CM | POA: Diagnosis not present

## 2020-05-27 DIAGNOSIS — D649 Anemia, unspecified: Secondary | ICD-10-CM | POA: Diagnosis not present

## 2020-05-27 DIAGNOSIS — E559 Vitamin D deficiency, unspecified: Secondary | ICD-10-CM | POA: Diagnosis not present

## 2020-05-27 DIAGNOSIS — G4733 Obstructive sleep apnea (adult) (pediatric): Secondary | ICD-10-CM | POA: Diagnosis not present

## 2020-05-27 DIAGNOSIS — E1140 Type 2 diabetes mellitus with diabetic neuropathy, unspecified: Secondary | ICD-10-CM | POA: Diagnosis not present

## 2020-05-27 DIAGNOSIS — Z794 Long term (current) use of insulin: Secondary | ICD-10-CM | POA: Diagnosis not present

## 2020-05-27 DIAGNOSIS — Z6841 Body Mass Index (BMI) 40.0 and over, adult: Secondary | ICD-10-CM | POA: Diagnosis not present

## 2020-05-27 DIAGNOSIS — E785 Hyperlipidemia, unspecified: Secondary | ICD-10-CM | POA: Diagnosis not present

## 2020-05-27 DIAGNOSIS — E1165 Type 2 diabetes mellitus with hyperglycemia: Secondary | ICD-10-CM | POA: Diagnosis not present

## 2020-05-27 DIAGNOSIS — I2510 Atherosclerotic heart disease of native coronary artery without angina pectoris: Secondary | ICD-10-CM | POA: Diagnosis not present

## 2020-05-27 DIAGNOSIS — I10 Essential (primary) hypertension: Secondary | ICD-10-CM | POA: Diagnosis not present

## 2020-05-31 ENCOUNTER — Other Ambulatory Visit (INDEPENDENT_AMBULATORY_CARE_PROVIDER_SITE_OTHER): Payer: Self-pay | Admitting: Primary Care

## 2020-05-31 DIAGNOSIS — Z794 Long term (current) use of insulin: Secondary | ICD-10-CM

## 2020-05-31 NOTE — Telephone Encounter (Signed)
Sent to PCP ?

## 2020-06-04 ENCOUNTER — Ambulatory Visit (INDEPENDENT_AMBULATORY_CARE_PROVIDER_SITE_OTHER): Payer: Medicaid Other | Admitting: Primary Care

## 2020-06-04 ENCOUNTER — Other Ambulatory Visit: Payer: Self-pay

## 2020-06-04 ENCOUNTER — Encounter (INDEPENDENT_AMBULATORY_CARE_PROVIDER_SITE_OTHER): Payer: Self-pay | Admitting: Primary Care

## 2020-06-04 VITALS — BP 146/83 | HR 88 | Temp 97.3°F | Ht 67.0 in | Wt >= 6400 oz

## 2020-06-04 DIAGNOSIS — E782 Mixed hyperlipidemia: Secondary | ICD-10-CM | POA: Diagnosis not present

## 2020-06-04 DIAGNOSIS — E118 Type 2 diabetes mellitus with unspecified complications: Secondary | ICD-10-CM

## 2020-06-04 DIAGNOSIS — Z760 Encounter for issue of repeat prescription: Secondary | ICD-10-CM | POA: Diagnosis not present

## 2020-06-04 NOTE — Progress Notes (Signed)
Established Patient Office Visit  Subjective:  Patient ID: Lindsay [name], female    DOB: 14-Oct-1981  Age: 39 y.o. MRN: [id number]  CC:  Chief Complaint  Patient presents with   Hyperlipidemia   Obesity    HPI Lindsay [name] is a 39 year old female  presents today for blood work and medication refill. Blood pressure is elvated but improving followed by cardiolopgy. .Denies shortness of breath, headaches, chest pain, sudden onset, vision changes, unilateral weakness, dizziness, paresthesias  Past Medical History:  Diagnosis Date   ARF (acute renal failure) ([place]) 04/2015   Asthma    Cellulitis of right upper extremity    Coronary artery disease    Diabetes mellitus    insulin dependent   Hyperlipidemia LDL goal <70    Hypertension    NSTEMI (non-ST elevated myocardial infarction) ([place]) 08/2016   Obesity    S/P angioplasty with stent 08/2016   DES to mLAD and PTCA only to 2nd diag ostium.     Past Surgical History:  Procedure Laterality Date   CARDIAC CATHETERIZATION N/A 09/07/2016   Procedure: Left Heart Cath and Coronary Angiography;  Surgeon: [name], MD;  Location: [facility];  Service: Cardiovascular;  Laterality: N/A;   CARDIAC CATHETERIZATION N/A 09/07/2016   Procedure: Coronary Stent Intervention;  Surgeon: [name], MD;  Location: [facility];  Service: Cardiovascular;  Laterality: N/A;   CARDIAC CATHETERIZATION N/A 09/07/2016   Procedure: Coronary Balloon Angioplasty;  Surgeon: [name], MD;  Location: [facility];  Service: Cardiovascular;  Laterality: N/A;   CESAREAN SECTION     IRRIGATION AND DEBRIDEMENT SHOULDER Right 04/30/2015   Procedure: IRRIGATION AND DEBRIDEMENT SHOULDER;  Surgeon: Renette Butters, MD;  Location: [facility];  Service: Orthopedics;  Laterality: Right;   IRRIGATION AND DEBRIDEMENT SHOULDER Right 05/01/2015   LEFT HEART CATH AND CORONARY ANGIOGRAPHY N/A 04/25/2020   Procedure: LEFT  HEART CATH AND CORONARY ANGIOGRAPHY;  Surgeon: [name], MD;  Location: [facility];  Service: Cardiovascular;  Laterality: N/A;   LEG SURGERY     SHOULDER ARTHROSCOPY Right 04/30/2015   Procedure: ARTHROSCOPY SHOULDER;  Surgeon: Renette Butters, MD;  Location: [facility];  Service: Orthopedics;  Laterality: Right;   TONSILLECTOMY      Family History  Problem Relation Age of Onset   Diabetes Mother    Hypertension Mother    Heart disease Mother    Diabetes Father    Heart disease Father    Stroke Maternal Grandmother    Cancer Maternal Grandmother     Social History   Socioeconomic History   Marital status: Single    Spouse name: Not on file   Number of children: Not on file   Years of education: Not on file   Highest education level: Not on file  Occupational History   Not on file  Tobacco Use   Smoking status: Never Smoker   Smokeless tobacco: Never Used  Vaping Use   Vaping Use: Never used  Substance and Sexual Activity   Alcohol use: Yes    Comment: socially   Drug use: No   Sexual activity: Not on file  Other Topics Concern   Not on file  Social History Narrative   Not on file   Social Determinants of Health   Financial Resource Strain:    Difficulty of Paying Living Expenses:   Food Insecurity:    Worried About Running Out of  Food in the Last Year:    Arboriculturist in the Last Year:   Transportation Needs:    Film/video editor (Medical):    Lack of Transportation (Non-Medical):   Physical Activity:    Days of Exercise per Week:    Minutes of Exercise per Session:   Stress:    Feeling of Stress :   Social Connections:    Frequency of Communication with Friends and Family:    Frequency of Social Gatherings with Friends and Family:    Attends Religious Services:    Active Member of Clubs or Organizations:    Attends Music therapist:    Marital Status:   Intimate Partner Violence:     Fear of Current or Ex-Partner:    Emotionally Abused:    Physically Abused:    Sexually Abused:     Outpatient Medications Prior to Visit  Medication Sig Dispense Refill   Accu-Chek FastClix Lancets MISC USE 1 TO CHECK GLUCOSE 4 TIMES DAILY 102 each 2   albuterol (VENTOLIN HFA) 108 (90 Base) MCG/ACT inhaler INHALE 2 PUFFS BY MOUTH EVERY 6 HOURS AS NEEDED FOR WHEEZING FOR SHORTNESS OF BREATH 18 g 2   amLODipine (NORVASC) 10 MG tablet Take 1 tablet (10 mg total) by mouth daily. 90 tablet 0   aspirin 81 MG tablet Take 1 tablet (81 mg total) by mouth daily. 30 tablet 11   aspirin-acetaminophen-caffeine (EXCEDRIN MIGRAINE) 250-250-65 MG tablet Take 2 tablets by mouth every 6 (six) hours as needed for headache.     atorvastatin (LIPITOR) 40 MG tablet Take 80 mg by mouth daily.     baclofen (LIORESAL) 10 MG tablet Take 1 tablet (10 mg total) by mouth 3 (three) times daily as needed for muscle spasms. 30 tablet 3   BD VEO INSULIN SYRINGE U/F 31G X 15/64" 1 ML MISC USE  SYRINGE ONCE DAILY 100 each 1   Blood Glucose Monitoring Suppl (ACCU-CHEK AVIVA PLUS) w/Device KIT 1 each by Does not apply route as directed. 1 kit 0   diphenhydrAMINE (BENADRYL) 25 MG tablet Take 50 mg by mouth in the morning and at bedtime. Allergies     DULoxetine (CYMBALTA) 30 MG capsule Take 1 capsule by mouth once daily 90 capsule 1   empagliflozin (JARDIANCE) 10 MG TABS tablet Take 10 mg by mouth daily. 30 tablet 3   fluticasone (FLOVENT HFA) 110 MCG/ACT inhaler Inhale 2 puffs into the lungs 2 (two) times daily. 1 Inhaler 12   gabapentin (NEURONTIN) 300 MG capsule TAKE 2 CAPSULES BY MOUTH THREE TIMES DAILY 180 capsule 0   glucose blood (ACCU-CHEK AVIVA PLUS) test strip Use as instructed to test blood sugar 4 times daily. 100 each 12   hydrochlorothiazide (HYDRODIURIL) 25 MG tablet Take 1 tablet (25 mg total) by mouth every morning. 90 tablet 1   ibuprofen (ADVIL,MOTRIN) 200 MG tablet Take 400 mg by mouth  every 6 (six) hours as needed for fever or moderate pain.     Insulin Pen Needle (B-D UF III MINI PEN NEEDLES) 31G X 5 MM MISC Use as instructed. Monitor blood glucose levels three times per day 90 each 1   insulin regular human CONCENTRATED (HUMULIN R) 500 UNIT/ML injection Inject 50 Units into the skin.     isosorbide mononitrate (IMDUR) 30 MG 24 hr tablet Take 0.5 tablets (15 mg total) by mouth daily. 15 tablet 11   liraglutide (VICTOZA) 18 MG/3ML SOPN Inject 1.8 mg into the skin at bedtime.  losartan (COZAAR) 100 MG tablet Take 1 tablet by mouth once daily 90 tablet 3   Melatonin 10 MG TABS Take 3 tablets by mouth at bedtime.     metoprolol tartrate (LOPRESSOR) 100 MG tablet Take 1 tablet (100 mg total) by mouth 2 (two) times daily. 60 tablet 6   Multiple Vitamins-Minerals (MULTIVITAMIN PO) Take 1 tablet by mouth daily.     nitroGLYCERIN (NITROSTAT) 0.4 MG SL tablet Place 1 tablet (0.4 mg total) under the tongue every 5 (five) minutes as needed for chest pain. 25 tablet 11   LANTUS SOLOSTAR 100 UNIT/ML Solostar Pen INJECT 50 UNITS SUBCUTANEOUSLY AT BEDTIME 15 mL 2   No facility-administered medications prior to visit.    Allergies  Allergen Reactions   Hydrazine Yellow [Tartrazine] Shortness Of Breath and Swelling    Swelling mostly noticed in legs and feet, retaining urination, shortness of breaht, and minor chest pain   Lisinopril Shortness Of Breath    Was on prinzide; had sob/chest pain on it.   Tylenol [Acetaminophen] Itching and Swelling    Itching of the mouth, swelling of tongue and stomach started hurting    ROS Review of Systems  Constitutional: Positive for fatigue.  Musculoskeletal: Positive for back pain and gait problem.       Using a walker   All other systems reviewed and are negative.     Objective:    Physical Exam Vitals:   06/04/20 1041  BP: (!) 146/83  Pulse: 88  Temp: (!) 97.3 F (36.3 C)  TempSrc: Temporal  SpO2: 95%  Weight:  (!) 421 lb 9.6 oz (191.2 kg)  Height: 5' 7"  (1.702 m)   General: Vital signs reviewed.  Patient is well-developed and well-nourished morbid obesity  in no acute distress and cooperative with exam.  Head: Normocephalic and atraumatic. Eyes: EOMI, conjunctivae normal, no scleral icterus.  Neck: Supple, trachea midline, normal ROM, no JVD, masses, thyromegaly, or carotid bruit present.  Cardiovascular: RRR, S1 normal, S2 normal, no murmurs, gallops, or rubs. Pulmonary/Chest: Clear to auscultation bilaterally, no wheezes, rales, or rhonchi. Abdominal: Soft, non-tender, non-distended, BS +, no masses, organomegaly, or guarding present.  Musculoskeletal: No joint deformities, erythema, or stiffness, ROM decrease lower extremities  and nontender. Extremities:  lower extremity edema bilaterally,  pulses symmetric and intact bilaterally. No cyanosis or clubbing. Neurological: A&O x3, Strength is normal and symmetric bilaterally, cranial nerve II-XII are grossly intact, no focal motor deficit, sensory intact to light touch bilaterally.  Skin: Warm, dry and intact. No rashes or erythema. Psychiatric: Normal mood and affect. speech and behavior is normal. Cognition and memory are normal.  BP (!) 146/83 (BP Location: Left Arm, Patient Position: Sitting) Comment (Cuff Size): THIGH   Pulse 88    Temp (!) 97.3 F (36.3 C) (Temporal)    Ht 5' 7"  (1.702 m)    Wt (!) 421 lb 9.6 oz (191.2 kg)    LMP 04/30/2020 (Approximate)    SpO2 95%    BMI 66.03 kg/m  Wt Readings from Last 3 Encounters:  06/04/20 (!) 421 lb 9.6 oz (191.2 kg)  05/13/20 (!) 412 lb 9.6 oz (187.2 kg)  04/25/20 (!) 419 lb (190.1 kg)     Health Maintenance Due  Topic Date Due   COVID-19 Vaccine (1) Never done   OPHTHALMOLOGY EXAM  11/20/2017   PAP SMEAR-Modifier  08/15/2019    There are no preventive care reminders to display for this patient.  Lab Results  Component Value Date  TSH 2.930 05/28/2017   Lab Results  Component Value  Date   WBC 9.0 04/19/2020   HGB 14.1 04/19/2020   HCT 45.1 04/19/2020   MCV 84 04/19/2020   PLT 367 04/19/2020   Lab Results  Component Value Date   NA 140 04/19/2020   K 4.7 04/19/2020   CO2 26 04/19/2020   GLUCOSE 177 (H) 04/19/2020   BUN 12 04/19/2020   CREATININE 1.04 (H) 04/19/2020   BILITOT 0.3 04/19/2020   ALKPHOS 266 (H) 04/19/2020   AST 54 (H) 04/19/2020   ALT 52 (H) 04/19/2020   PROT 8.2 04/19/2020   ALBUMIN 4.4 04/19/2020   CALCIUM 10.7 (H) 04/19/2020   ANIONGAP 10 01/11/2019   GFR 105.56 04/12/2018   Lab Results  Component Value Date   CHOL 138 12/05/2019   Lab Results  Component Value Date   HDL 47 12/05/2019   Lab Results  Component Value Date   LDLCALC 67 12/05/2019   Lab Results  Component Value Date   TRIG 134 12/05/2019   Lab Results  Component Value Date   CHOLHDL 2.9 12/05/2019   Lab Results  Component Value Date   HGBA1C 8.4 (H) 04/12/2019      Assessment & Plan:  Lindsay [name] was seen today for hyperlipidemia and obesity.  Diagnoses and all orders for this visit:  Mixed hyperlipidemia  Decrease your fatty foods, red meat, cheese, milk and increase fiber like whole grains and veggies.  Currently on atorvastatin 80 but will not refill until labs reviewed.  Diabetes mellitus with complication ([facility]) Followed by endocrinology   Morbid obesity ([place]) Followed by bariatrics  Medication refill Denied until labs reviewed   No orders of the defined types were placed in this encounter.   Follow-up: Return in about 6 months (around 12/04/2020) for follow up-.    [name], NP

## 2020-06-04 NOTE — Patient Instructions (Signed)

## 2020-06-07 ENCOUNTER — Other Ambulatory Visit: Payer: Self-pay

## 2020-06-07 ENCOUNTER — Other Ambulatory Visit (INDEPENDENT_AMBULATORY_CARE_PROVIDER_SITE_OTHER): Payer: Medicaid Other

## 2020-06-07 DIAGNOSIS — E782 Mixed hyperlipidemia: Secondary | ICD-10-CM | POA: Diagnosis not present

## 2020-06-08 LAB — LIPID PANEL
Chol/HDL Ratio: 4 ratio (ref 0.0–4.4)
Cholesterol, Total: 194 mg/dL (ref 100–199)
HDL: 48 mg/dL (ref >39)
LDL Chol Calc (NIH): 104 mg/dL — ABNORMAL HIGH (ref 0–99)
Triglycerides: 247 mg/dL — ABNORMAL HIGH (ref 0–149)
VLDL Cholesterol Cal: 42 mg/dL — ABNORMAL HIGH (ref 5–40)

## 2020-06-10 ENCOUNTER — Other Ambulatory Visit (INDEPENDENT_AMBULATORY_CARE_PROVIDER_SITE_OTHER): Payer: Self-pay | Admitting: Primary Care

## 2020-06-10 DIAGNOSIS — E782 Mixed hyperlipidemia: Secondary | ICD-10-CM

## 2020-06-10 MED ORDER — ATORVASTATIN CALCIUM 40 MG PO TABS: 40.0000 mg | ORAL_TABLET | Freq: Every day | ORAL | 3 refills | Status: DC

## 2020-06-19 ENCOUNTER — Other Ambulatory Visit: Payer: Self-pay | Admitting: Primary Care

## 2020-06-19 DIAGNOSIS — R200 Anesthesia of skin: Secondary | ICD-10-CM

## 2020-06-20 DIAGNOSIS — G4733 Obstructive sleep apnea (adult) (pediatric): Secondary | ICD-10-CM | POA: Diagnosis not present

## 2020-06-29 ENCOUNTER — Other Ambulatory Visit: Payer: Self-pay | Admitting: Family Medicine

## 2020-07-03 ENCOUNTER — Ambulatory Visit (INDEPENDENT_AMBULATORY_CARE_PROVIDER_SITE_OTHER): Payer: Medicaid Other | Admitting: Podiatry

## 2020-07-03 ENCOUNTER — Other Ambulatory Visit (INDEPENDENT_AMBULATORY_CARE_PROVIDER_SITE_OTHER): Payer: Self-pay | Admitting: Primary Care

## 2020-07-03 ENCOUNTER — Other Ambulatory Visit: Payer: Self-pay

## 2020-07-03 ENCOUNTER — Encounter: Payer: Self-pay | Admitting: Podiatry

## 2020-07-03 DIAGNOSIS — I10 Essential (primary) hypertension: Secondary | ICD-10-CM

## 2020-07-03 DIAGNOSIS — E1142 Type 2 diabetes mellitus with diabetic polyneuropathy: Secondary | ICD-10-CM

## 2020-07-03 DIAGNOSIS — S90121A Contusion of right lesser toe(s) without damage to nail, initial encounter: Secondary | ICD-10-CM | POA: Diagnosis not present

## 2020-07-03 DIAGNOSIS — M79674 Pain in right toe(s): Secondary | ICD-10-CM

## 2020-07-03 DIAGNOSIS — Z760 Encounter for issue of repeat prescription: Secondary | ICD-10-CM

## 2020-07-06 NOTE — Progress Notes (Signed)
Subjective:  Lindsay [name] presents today for preventative diabetic foot care. She is  s/p temporary total nail avulsion L hallux.  Patient states she stubbed her left great toe and she has tenderness. Aslo daughter may have stepped on her right foot.  Her daughter is present during today's visit. Daughter states she would like to become a International aid/development worker.  Objective: There were no vitals filed for this visit.  Lindsay [name] is a pleasant 39 y.o. African American female, morbidly obese in NAD. AAO x 3.   Capillary fill time to digits <3 seconds b/l. Palpable DP pulses b/l. Faintly palpable PT pulses b/l. Pedal hair sparse b/l. Skin temperature gradient within normal limits b/l. Nonpitting edema noted b/l LE.  Pedal skin with normal turgor, texture and tone bilaterally. No interdigital macerations bilaterally.   Procedure site noted have completely healed and new nail is growing out proximal 2/3 of digit.  Toenails 1-5 right and 2-5 left  are painful, elongated, discolored, dystrophic with subungual debris. Pain with dorsal palpation of nailplates. No erythema, no edema, no drainage noted.  Subungual hematoma noted of the right 2nd digit. Appears to be subacute with dark heme under the toenail. Nailplate remains adered. No erythema, no edema, no active drainage, no flocculence, no fluid expressed when pressure applied to digit.  Normal muscle strength 5/5 to all lower extremity muscle groups bilaterally. No gross bony deformities bilaterally. No pain crepitus or joint limitation noted with ROM b/l. Patient ambulates independent of any assistive aids. She is able to move left hallux and there is no frank edema, no ecchymosis nor warmth noted. She is wearing open-toed slides on today's visit.  Protective sensation decreased with 10 gram monofilament b/l.  Assessment: 1. Pain in toes of both feet   2. Contusion of lesser toe of right foot without damage to nail, initial encounter   3. Diabetic  peripheral neuropathy associated with type 2 diabetes mellitus (HCC)    Plan: -Examined patient. -Practice diabetic foot care principles. -Again advised Lindsay [name] to protect her feet. She needs to wear enclosed shoes when in the home due to multiple occurrences of her daughter stepping on her feet. She related understanding. -Subungual hematoma right 2nd digit stable. Monitor for any changes. -Toenails right hallux and 2-5 b/l debrided in length and girth without iatrogenic laceration. -Patient to continue soft, supportive shoe gear daily. -Patient to report any pedal injuries to medical professional immediately. -Patient/POA to call should there be question/concern in the interim.  Return in about 3 months (around 10/03/2020) for diabetic nail trim.  Freddie Breech, DPM

## 2020-07-18 ENCOUNTER — Other Ambulatory Visit (INDEPENDENT_AMBULATORY_CARE_PROVIDER_SITE_OTHER): Payer: Self-pay | Admitting: Family Medicine

## 2020-07-18 DIAGNOSIS — E118 Type 2 diabetes mellitus with unspecified complications: Secondary | ICD-10-CM

## 2020-07-29 DIAGNOSIS — G4733 Obstructive sleep apnea (adult) (pediatric): Secondary | ICD-10-CM | POA: Diagnosis not present

## 2020-08-05 ENCOUNTER — Other Ambulatory Visit: Payer: Self-pay | Admitting: Family Medicine

## 2020-08-05 ENCOUNTER — Other Ambulatory Visit (INDEPENDENT_AMBULATORY_CARE_PROVIDER_SITE_OTHER): Payer: Self-pay | Admitting: Family Medicine

## 2020-08-05 DIAGNOSIS — E118 Type 2 diabetes mellitus with unspecified complications: Secondary | ICD-10-CM

## 2020-08-05 DIAGNOSIS — E1165 Type 2 diabetes mellitus with hyperglycemia: Secondary | ICD-10-CM

## 2020-08-18 NOTE — Progress Notes (Deleted)
Cardiology Office Note:    Date:  08/18/2020   ID:  Lindsay [name], DOB 1981-11-13, MRN [id number]  PCP:  Grayce Sessions, NP  Cardiologist:  Lesleigh Noe, MD   Referring MD: Grayce Sessions, NP   No chief complaint on file.   History of Present Illness:    Lindsay [name] is a 39 y.o. female with a hx of a hx of NSTEMI September 2017, with pk troponin 6.64 with cath and PCI with DES to mLAD and PTCA only to 2nd diag ostium. Still with 50% residual stenosis. EF with cath 35-45%but Echo was 55-60%  ***  Past Medical History:  Diagnosis Date  . ARF (acute renal failure) (HCC) 04/2015  . Asthma   . Cellulitis of right upper extremity   . Coronary artery disease   . Diabetes mellitus    insulin dependent  . Hyperlipidemia LDL goal <70   . Hypertension   . NSTEMI (non-ST elevated myocardial infarction) (HCC) 08/2016  . Obesity   . S/P angioplasty with stent 08/2016   DES to mLAD and PTCA only to 2nd diag ostium.     Past Surgical History:  Procedure Laterality Date  . CARDIAC CATHETERIZATION N/A 09/07/2016   Procedure: Left Heart Cath and Coronary Angiography;  Surgeon: Lyn Records, MD;  Location: [facility] INVASIVE CV LAB;  Service: Cardiovascular;  Laterality: N/A;  . CARDIAC CATHETERIZATION N/A 09/07/2016   Procedure: Coronary Stent Intervention;  Surgeon: Lyn Records, MD;  Location: [facility] INVASIVE CV LAB;  Service: Cardiovascular;  Laterality: N/A;  . CARDIAC CATHETERIZATION N/A 09/07/2016   Procedure: Coronary Balloon Angioplasty;  Surgeon: Lyn Records, MD;  Location: [facility] INVASIVE CV LAB;  Service: Cardiovascular;  Laterality: N/A;  . CESAREAN SECTION    . IRRIGATION AND DEBRIDEMENT SHOULDER Right 04/30/2015   Procedure: IRRIGATION AND DEBRIDEMENT SHOULDER;  Surgeon: Sheral Apley, MD;  Location: [facility] OR;  Service: Orthopedics;  Laterality: Right;  . IRRIGATION AND DEBRIDEMENT SHOULDER Right 05/01/2015  . LEFT HEART CATH AND CORONARY ANGIOGRAPHY N/A 04/25/2020     Procedure: LEFT HEART CATH AND CORONARY ANGIOGRAPHY;  Surgeon: Lyn Records, MD;  Location: [facility] INVASIVE CV LAB;  Service: Cardiovascular;  Laterality: N/A;  . LEG SURGERY    . SHOULDER ARTHROSCOPY Right 04/30/2015   Procedure: ARTHROSCOPY SHOULDER;  Surgeon: Sheral Apley, MD;  Location: [facility] OR;  Service: Orthopedics;  Laterality: Right;  . TONSILLECTOMY      Current Medications: No outpatient medications have been marked as taking for the 08/23/20 encounter (Appointment) with Lyn Records, MD.     Allergies:   Hydrazine yellow [tartrazine], Lisinopril, and Tylenol [acetaminophen]   Social History   Socioeconomic History  . Marital status: Single    Spouse name: Not on file  . Number of children: Not on file  . Years of education: Not on file  . Highest education level: Not on file  Occupational History  . Not on file  Tobacco Use  . Smoking status: Never Smoker  . Smokeless tobacco: Never Used  Vaping Use  . Vaping Use: Never used  Substance and Sexual Activity  . Alcohol use: Yes    Comment: socially  . Drug use: No  . Sexual activity: Not on file  Other Topics Concern  . Not on file  Social History Narrative  . Not on file   Social Determinants of Health   Financial Resource Strain:   . Difficulty of Paying Living Expenses: Not  on file  Food Insecurity:   . Worried About Programme researcher, broadcasting/film/video in the Last Year: Not on file  . Ran Out of Food in the Last Year: Not on file  Transportation Needs:   . Lack of Transportation (Medical): Not on file  . Lack of Transportation (Non-Medical): Not on file  Physical Activity:   . Days of Exercise per Week: Not on file  . Minutes of Exercise per Session: Not on file  Stress:   . Feeling of Stress : Not on file  Social Connections:   . Frequency of Communication with Friends and Family: Not on file  . Frequency of Social Gatherings with Friends and Family: Not on file  . Attends Religious Services: Not on file  .  Active Member of Clubs or Organizations: Not on file  . Attends Banker Meetings: Not on file  . Marital Status: Not on file     Family History: The patient's family history includes Cancer in her maternal grandmother; Diabetes in her father and mother; Heart disease in her father and mother; Hypertension in her mother; Stroke in her maternal grandmother.  ROS:   Please see the history of present illness.    *** All other systems reviewed and are negative.  EKGs/Labs/Other Studies Reviewed:    The following studies were reviewed today: ***  EKG:  EKG ***  Recent Labs: 04/19/2020: ALT 52; BUN 12; Creatinine, Ser 1.04; Hemoglobin 14.1; Platelets 367; Potassium 4.7; Sodium 140  Recent Lipid Panel    Component Value Date/Time   CHOL 194 06/07/2020 0911   TRIG 247 (H) 06/07/2020 0911   HDL 48 06/07/2020 0911   CHOLHDL 4.0 06/07/2020 0911   CHOLHDL 3 04/12/2018 1006   VLDL 38.6 04/12/2018 1006   LDLCALC 104 (H) 06/07/2020 0911    Physical Exam:    VS:  There were no vitals taken for this visit.    Wt Readings from Last 3 Encounters:  06/04/20 (!) 421 lb 9.6 oz (191.2 kg)  05/13/20 (!) 412 lb 9.6 oz (187.2 kg)  04/25/20 (!) 419 lb (190.1 kg)     GEN: ***. No acute distress HEENT: Normal NECK: No JVD. LYMPHATICS: No lymphadenopathy CARDIAC: *** RRR without murmur, gallop, or edema. VASCULAR: *** Normal Pulses. No bruits. RESPIRATORY:  Clear to auscultation without rales, wheezing or rhonchi  ABDOMEN: Soft, non-tender, non-distended, No pulsatile mass, MUSCULOSKELETAL: No deformity  SKIN: Warm and dry NEUROLOGIC:  Alert and oriented x 3 PSYCHIATRIC:  Normal affect   ASSESSMENT:    1. Coronary artery disease involving native coronary artery of native heart with angina pectoris (HCC)   2. Essential hypertension   3. Mixed hyperlipidemia   4. Diabetes mellitus with complication (HCC)   5. Morbid obesity (HCC)   6. OSA (obstructive sleep apnea)   7.  Educated about COVID-19 virus infection    PLAN:    In order of problems listed above:  1. ***   Medication Adjustments/Labs and Tests Ordered: Current medicines are reviewed at length with the patient today.  Concerns regarding medicines are outlined above.  No orders of the defined types were placed in this encounter.  No orders of the defined types were placed in this encounter.   There are no Patient Instructions on file for this visit.   Signed, Lesleigh Noe, MD  08/18/2020 2:16 PM    [facility]

## 2020-08-23 ENCOUNTER — Ambulatory Visit: Payer: Medicaid Other | Admitting: Interventional Cardiology

## 2020-08-27 ENCOUNTER — Other Ambulatory Visit (INDEPENDENT_AMBULATORY_CARE_PROVIDER_SITE_OTHER): Payer: Self-pay | Admitting: Primary Care

## 2020-08-27 DIAGNOSIS — Z794 Long term (current) use of insulin: Secondary | ICD-10-CM

## 2020-09-30 ENCOUNTER — Other Ambulatory Visit: Payer: Self-pay | Admitting: Family Medicine

## 2020-10-07 ENCOUNTER — Ambulatory Visit: Payer: Medicaid Other | Admitting: Podiatry

## 2020-10-07 ENCOUNTER — Encounter: Payer: Self-pay | Admitting: Podiatry

## 2020-10-07 ENCOUNTER — Other Ambulatory Visit: Payer: Self-pay

## 2020-10-07 DIAGNOSIS — M79675 Pain in left toe(s): Secondary | ICD-10-CM | POA: Diagnosis not present

## 2020-10-07 DIAGNOSIS — B351 Tinea unguium: Secondary | ICD-10-CM

## 2020-10-07 DIAGNOSIS — M79674 Pain in right toe(s): Secondary | ICD-10-CM | POA: Diagnosis not present

## 2020-10-07 DIAGNOSIS — E1142 Type 2 diabetes mellitus with diabetic polyneuropathy: Secondary | ICD-10-CM

## 2020-10-10 NOTE — Progress Notes (Signed)
Subjective: Lindsay [name] presents today for follow up of preventative diabetic foot care with and painful mycotic nails b/l.  Pain interferes with ambulation. Aggravating factors include wearing enclosed shoe gear. Pt also presents with callus(es) right foot which interfere with ambulation.   She voices no new pedal problems on today's visit.  Allergies  Allergen Reactions  . Hydrazine Yellow [Tartrazine] Shortness Of Breath and Swelling    Swelling mostly noticed in legs and feet, retaining urination, shortness of breaht, and minor chest pain  . Lisinopril Shortness Of Breath    Was on prinzide; had sob/chest pain on it.  . Tylenol [Acetaminophen] Itching and Swelling    Itching of the mouth, swelling of tongue and stomach started hurting     Objective: There were no vitals filed for this visit.  Vascular Examination:  Capillary fill time to digits <3s b/l, palpable DP pulses b/l, faintly palpable PT pulses b/l, pedal hair present b/l, skin temperature gradient within normal limits b/l and nonpitting edema noted b/l LE  Dermatological Examination: Pedal skin with normal turgor, texture and tone bilaterally, no open wounds bilaterally, no interdigital macerations bilaterally, toenails 1-5 b/l elongated, dystrophic, thickened, crumbly with subungual debris and hyperkeratotic lesion(s) submet head 1 right foot.  No erythema, no edema, no drainage, no flocculence.  She has new toenails growing out left hallux. Incurvated nailplates b/l. No erythema, no edema, no drainage, no fluctuance.  Musculoskeletal: Normal muscle strength 5/5 to all lower extremity muscle groups bilaterally, no gross bony deformities bilaterally and no pain crepitus or joint limitation noted with ROM b/l  Neurological: Protective sensation decreased with 10 gram monofilament b/l  Assessment: 1. Pain due to onychomycosis of toenails of both feet   2. Diabetic peripheral neuropathy associated with type 2  diabetes mellitus (HCC)     Plan: -Continue diabetic foot care principles. Literature dispensed on today.  -Toenails 1-5 b/l were debrided in length and girth without iatrogenic bleeding. -Callus  debrided without complication or incident. Total number debrided =1 submet head  -Patient to continue soft, supportive shoe gear daily. -Patient to report any pedal injuries to medical professional immediately. -Patient/POA to call should there be question/concern in the interim.  Return in about 3 months (around 01/07/2021).   Geralynn Rile, DPM

## 2020-10-14 ENCOUNTER — Other Ambulatory Visit: Payer: Self-pay | Admitting: Family Medicine

## 2020-10-14 ENCOUNTER — Other Ambulatory Visit (INDEPENDENT_AMBULATORY_CARE_PROVIDER_SITE_OTHER): Payer: Self-pay | Admitting: Primary Care

## 2020-10-14 DIAGNOSIS — I10 Essential (primary) hypertension: Secondary | ICD-10-CM

## 2020-10-14 DIAGNOSIS — Z760 Encounter for issue of repeat prescription: Secondary | ICD-10-CM

## 2020-11-18 ENCOUNTER — Other Ambulatory Visit: Payer: Self-pay | Admitting: Family Medicine

## 2020-11-18 ENCOUNTER — Other Ambulatory Visit (INDEPENDENT_AMBULATORY_CARE_PROVIDER_SITE_OTHER): Payer: Self-pay | Admitting: Primary Care

## 2020-11-18 DIAGNOSIS — Z794 Long term (current) use of insulin: Secondary | ICD-10-CM

## 2020-11-18 DIAGNOSIS — R200 Anesthesia of skin: Secondary | ICD-10-CM

## 2020-11-18 DIAGNOSIS — R202 Paresthesia of skin: Secondary | ICD-10-CM

## 2020-11-28 ENCOUNTER — Telehealth (INDEPENDENT_AMBULATORY_CARE_PROVIDER_SITE_OTHER): Payer: Self-pay | Admitting: Primary Care

## 2020-11-28 NOTE — Telephone Encounter (Signed)
Lindsay [name], from [place], called and is requesting to follow up on a fax for medical records for the pts CPAP supplies that was sent on 11/20/20. She states that they wanted to make sure that PCP signed and date the form. She states that they cannot accept E Signature. Please advise.     [phone]

## 2020-12-04 ENCOUNTER — Ambulatory Visit (INDEPENDENT_AMBULATORY_CARE_PROVIDER_SITE_OTHER): Payer: Medicaid Other | Admitting: Primary Care

## 2020-12-04 NOTE — Telephone Encounter (Signed)
Called back to [facility]. No one was able to locate anything that was in reference to needing CPAP records or signatures. A form was faxed back to [facility] oxygen weeks ago. I will refax that form in the event that is was never received.

## 2020-12-05 ENCOUNTER — Other Ambulatory Visit: Payer: Self-pay

## 2020-12-05 ENCOUNTER — Ambulatory Visit (INDEPENDENT_AMBULATORY_CARE_PROVIDER_SITE_OTHER): Payer: Medicaid Other | Admitting: Primary Care

## 2020-12-05 ENCOUNTER — Encounter (INDEPENDENT_AMBULATORY_CARE_PROVIDER_SITE_OTHER): Payer: Self-pay | Admitting: Primary Care

## 2020-12-05 VITALS — BP 150/90 | HR 76 | Temp 97.3°F | Ht 67.0 in | Wt >= 6400 oz

## 2020-12-05 DIAGNOSIS — I10 Essential (primary) hypertension: Secondary | ICD-10-CM

## 2020-12-05 DIAGNOSIS — E118 Type 2 diabetes mellitus with unspecified complications: Secondary | ICD-10-CM | POA: Diagnosis not present

## 2020-12-05 DIAGNOSIS — Z124 Encounter for screening for malignant neoplasm of cervix: Secondary | ICD-10-CM

## 2020-12-05 DIAGNOSIS — M25511 Pain in right shoulder: Secondary | ICD-10-CM | POA: Diagnosis not present

## 2020-12-05 DIAGNOSIS — E782 Mixed hyperlipidemia: Secondary | ICD-10-CM | POA: Diagnosis not present

## 2020-12-05 DIAGNOSIS — Z760 Encounter for issue of repeat prescription: Secondary | ICD-10-CM | POA: Diagnosis not present

## 2020-12-05 MED ORDER — CELECOXIB 100 MG PO CAPS: 100.0000 mg | ORAL_CAPSULE | Freq: Two times a day (BID) | ORAL | 0 refills | Status: DC

## 2020-12-05 NOTE — Progress Notes (Signed)
Established Patient Office Visit  Subjective:  Patient ID: Lindsay [name], female    DOB: [birth year]  Age: 39 y.o. MRN: [id number]  CC:  Chief Complaint  Patient presents with  . Obesity  . Hyperlipidemia    HPI Lindsay [name] is a 39 year old female who  presents for management of Hyperlipidemia and is having right shoulder pain that start 2 days ago denies trauma, no lifting, nor does Lindsay [name] sleep on that side. Lindsay [name] does have a hx of surgery on that shoulder approximately  4- 5 years ago . Rates pain 8/10 uses ibuprofen  200 (x 2) or Asprin- 81 it trappers it some but some pain remain   Past Medical History:  Diagnosis Date  . ARF (acute renal failure) ([place]) 04/2015  . Asthma   . Cellulitis of right upper extremity   . Coronary artery disease   . Diabetes mellitus    insulin dependent  . Hyperlipidemia LDL goal <70   . Hypertension   . NSTEMI (non-ST elevated myocardial infarction) ([place]) 08/2016  . Obesity   . S/P angioplasty with stent 08/2016   DES to mLAD and PTCA only to 2nd diag ostium.     Past Surgical History:  Procedure Laterality Date  . CARDIAC CATHETERIZATION N/A 09/07/2016   Procedure: Left Heart Cath and Coronary Angiography;  Surgeon: [name], MD;  Location: [place] CV LAB;  Service: Cardiovascular;  Laterality: N/A;  . CARDIAC CATHETERIZATION N/A 09/07/2016   Procedure: Coronary Stent Intervention;  Surgeon: [name], MD;  Location: [place] CV LAB;  Service: Cardiovascular;  Laterality: N/A;  . CARDIAC CATHETERIZATION N/A 09/07/2016   Procedure: Coronary Balloon Angioplasty;  Surgeon: [name], MD;  Location: [place] CV LAB;  Service: Cardiovascular;  Laterality: N/A;  . CESAREAN SECTION    . IRRIGATION AND DEBRIDEMENT SHOULDER Right 04/30/2015   Procedure: IRRIGATION AND DEBRIDEMENT SHOULDER;  Surgeon: Renette Butters, MD;  Location: [place];  Service: Orthopedics;  Laterality: Right;  . IRRIGATION AND DEBRIDEMENT SHOULDER  Right 05/01/2015  . LEFT HEART CATH AND CORONARY ANGIOGRAPHY N/A 04/25/2020   Procedure: LEFT HEART CATH AND CORONARY ANGIOGRAPHY;  Surgeon: [name], MD;  Location: [place] CV LAB;  Service: Cardiovascular;  Laterality: N/A;  . LEG SURGERY    . SHOULDER ARTHROSCOPY Right 04/30/2015   Procedure: ARTHROSCOPY SHOULDER;  Surgeon: Renette Butters, MD;  Location: [place];  Service: Orthopedics;  Laterality: Right;  . TONSILLECTOMY      Family History  Problem Relation Age of Onset  . Diabetes Mother   . Hypertension Mother   . Heart disease Mother   . Diabetes Father   . Heart disease Father   . Stroke Maternal Grandmother   . Cancer Maternal Grandmother     Social History   Socioeconomic History  . Marital status: Single    Spouse name: Not on file  . Number of children: Not on file  . Years of education: Not on file  . Highest education level: Not on file  Occupational History  . Not on file  Tobacco Use  . Smoking status: Never Smoker  . Smokeless tobacco: Never Used  Vaping Use  . Vaping Use: Never used  Substance and Sexual Activity  . Alcohol use: Yes    Comment: socially  . Drug use: No  . Sexual activity: Not on file  Other Topics Concern  . Not on file  Social History Narrative  .  Not on file   Social Determinants of Health   Financial Resource Strain: Not on file  Food Insecurity: Not on file  Transportation Needs: Not on file  Physical Activity: Not on file  Stress: Not on file  Social Connections: Not on file  Intimate Partner Violence: Not on file    Outpatient Medications Prior to Visit  Medication Sig Dispense Refill  . Accu-Chek FastClix Lancets MISC USE 1 TO CHECK GLUCOSE 4 TIMES DAILY 102 each 2  . albuterol (VENTOLIN HFA) 108 (90 Base) MCG/ACT inhaler INHALE 2 PUFFS BY MOUTH EVERY 6 HOURS AS NEEDED FOR WHEEZING FOR SHORTNESS OF BREATH 18 g 2  . amLODipine (NORVASC) 10 MG tablet Take 1 tablet by mouth once daily 90 tablet 0  . aspirin 81  MG tablet Take 1 tablet (81 mg total) by mouth daily. 30 tablet 11  . aspirin-acetaminophen-caffeine (EXCEDRIN MIGRAINE) 643-329-51 MG tablet Take 2 tablets by mouth every 6 (six) hours as needed for headache.    Marland Kitchen atorvastatin (LIPITOR) 40 MG tablet Take 1 tablet (40 mg total) by mouth daily. 90 tablet 3  . baclofen (LIORESAL) 10 MG tablet TAKE 1/2 TO 1 (ONE-HALF TO ONE) TABLET BY MOUTH THREE TIMES DAILY AS NEEDED FOR MUSCLE SPASM 30 tablet 0  . BD PEN NEEDLE NANO 2ND GEN 32G X 4 MM MISC     . BD VEO INSULIN SYRINGE U/F 31G X 15/64" 1 ML MISC USE  SYRINGE ONCE DAILY 100 each 1  . Blood Glucose Monitoring Suppl (ACCU-CHEK AVIVA PLUS) w/Device KIT 1 each by Does not apply route as directed. 1 kit 0  . Continuous Blood Gluc Receiver (Triplett) DEVI See admin instructions.    . Continuous Blood Gluc Sensor (DEXCOM G6 SENSOR) MISC Inject into the skin.    . Continuous Blood Gluc Transmit (DEXCOM G6 TRANSMITTER) MISC Inject into the skin.    Marland Kitchen diphenhydrAMINE (BENADRYL) 25 MG tablet Take 50 mg by mouth in the morning and at bedtime. Allergies    . diphenhydrAMINE (SOMINEX) 25 MG tablet Take by mouth.    . DULoxetine (CYMBALTA) 30 MG capsule Take 1 capsule by mouth once daily 90 capsule 0  . empagliflozin (JARDIANCE) 10 MG TABS tablet Take 10 mg by mouth daily. 30 tablet 3  . fluticasone (FLOVENT HFA) 110 MCG/ACT inhaler Inhale 2 puffs into the lungs 2 (two) times daily. 1 Inhaler 12  . gabapentin (NEURONTIN) 300 MG capsule TAKE 2 CAPSULES BY MOUTH THREE TIMES DAILY 180 capsule 0  . glucose blood (ACCU-CHEK AVIVA PLUS) test strip Use as instructed to test blood sugar 4 times daily. 100 each 12  . HUMULIN R U-500 KWIKPEN 500 UNIT/ML kwikpen Inject into the skin.    . hydrochlorothiazide (HYDRODIURIL) 25 MG tablet Take 1 tablet (25 mg total) by mouth every morning. 90 tablet 1  . ibuprofen (ADVIL,MOTRIN) 200 MG tablet Take 400 mg by mouth every 6 (six) hours as needed for fever or moderate pain.     . Insulin Pen Needle (B-D UF III MINI PEN NEEDLES) 31G X 5 MM MISC Use as instructed. Monitor blood glucose levels three times per day 90 each 1  . insulin regular human CONCENTRATED (HUMULIN R) 500 UNIT/ML injection Inject 50 Units into the skin.    Marland Kitchen isosorbide mononitrate (IMDUR) 30 MG 24 hr tablet Take 0.5 tablets (15 mg total) by mouth daily. 15 tablet 11  . liraglutide (VICTOZA) 18 MG/3ML SOPN Inject 1.8 mg into the skin at bedtime.     Marland Kitchen  losartan (COZAAR) 100 MG tablet Take 1 tablet by mouth once daily 90 tablet 3  . Melatonin 10 MG TABS Take 3 tablets by mouth at bedtime.    . metoprolol tartrate (LOPRESSOR) 100 MG tablet Take 1 tablet (100 mg total) by mouth 2 (two) times daily. 60 tablet 6  . Multiple Vitamins-Minerals (MULTIVITAMIN PO) Take 1 tablet by mouth daily.    . nitroGLYCERIN (NITROSTAT) 0.4 MG SL tablet Place 1 tablet (0.4 mg total) under the tongue every 5 (five) minutes as needed for chest pain. 25 tablet 11  . Ostomy Supplies (SKIN TAC ADHESIVE BARRIER WIPE) MISC Dispense and use as directed    . fluconazole (DIFLUCAN) 150 MG tablet Take by mouth.     No facility-administered medications prior to visit.    Allergies  Allergen Reactions  . Hydrazine Yellow [Tartrazine] Shortness Of Breath and Swelling    Swelling mostly noticed in legs and feet, retaining urination, shortness of breaht, and minor chest pain  . Lisinopril Shortness Of Breath    Was on prinzide; had sob/chest pain on it.  . Tylenol [Acetaminophen] Itching and Swelling    Itching of the mouth, swelling of tongue and stomach started hurting    ROS Review of Systems  Musculoskeletal:       Right shoulder pain  All other systems reviewed and are negative.     Objective:    Physical Exam Vitals reviewed.  Constitutional:      Appearance: Lindsay [name] is obese.     Comments: morbid  HENT:     Head: Normocephalic.     Right Ear: External ear normal.     Left Ear: External ear normal.     Nose: Nose  normal.  Cardiovascular:     Rate and Rhythm: Normal rate and regular rhythm.  Pulmonary:     Effort: Pulmonary effort is normal.     Breath sounds: Normal breath sounds.  Abdominal:     General: Bowel sounds are normal. There is distension.     Palpations: Abdomen is soft.  Musculoskeletal:     Cervical back: Normal range of motion and neck supple.  Skin:    General: Skin is warm and dry.  Neurological:     Mental Status: Lindsay [name] is alert and oriented to person, place, and time.  Psychiatric:        Mood and Affect: Mood normal.        Behavior: Behavior normal.        Thought Content: Thought content normal.        Judgment: Judgment normal.     BP (!) 150/90 (BP Location: Left Wrist, Patient Position: Sitting, Cuff Size: Normal)   Pulse 76   Temp (!) 97.3 F (36.3 C) (Temporal)   Ht 5' 7"  (1.702 m)   Wt (!) 411 lb 9.6 oz (186.7 kg)   SpO2 93%   BMI 64.47 kg/m  Wt Readings from Last 3 Encounters:  12/05/20 (!) 411 lb 9.6 oz (186.7 kg)  06/04/20 (!) 421 lb 9.6 oz (191.2 kg)  05/13/20 (!) 412 lb 9.6 oz (187.2 kg)     Health Maintenance Due  Topic Date Due  . PAP SMEAR-Modifier  08/15/2019    There are no preventive care reminders to display for this patient.  Lab Results  Component Value Date   TSH 2.930 05/28/2017   Lab Results  Component Value Date   WBC 9.0 04/19/2020   HGB 14.1 04/19/2020   HCT 45.1 04/19/2020  MCV 84 04/19/2020   PLT 367 04/19/2020   Lab Results  Component Value Date   NA 140 04/19/2020   K 4.7 04/19/2020   CO2 26 04/19/2020   GLUCOSE 177 (H) 04/19/2020   BUN 12 04/19/2020   CREATININE 1.04 (H) 04/19/2020   BILITOT 0.3 04/19/2020   ALKPHOS 266 (H) 04/19/2020   AST 54 (H) 04/19/2020   ALT 52 (H) 04/19/2020   PROT 8.2 04/19/2020   ALBUMIN 4.4 04/19/2020   CALCIUM 10.7 (H) 04/19/2020   ANIONGAP 10 01/11/2019   GFR 105.56 04/12/2018   Lab Results  Component Value Date   CHOL 194 06/07/2020   Lab Results  Component Value  Date   HDL 48 06/07/2020   Lab Results  Component Value Date   LDLCALC 104 (H) 06/07/2020   Lab Results  Component Value Date   TRIG 247 (H) 06/07/2020   Lab Results  Component Value Date   CHOLHDL 4.0 06/07/2020   Lab Results  Component Value Date   HGBA1C 8.4 (H) 04/12/2019      Assessment & Plan:  Lindsay [name] was seen today for obesity and hyperlipidemia.  Diagnoses and all orders for this visit:  Mixed hyperlipidemia Decrease your fatty foods, red meat, cheese, milk and increase fiber like whole grains and veggies.  Will obtain labs today currently taking atorvastatin 40 mg nightly  -     Lipid Panel  Essential hypertension  Elevated will recheck states Lindsay [name] take her medication followed by cardiology and managed for Coronary artery disease involving native coronary artery of native heart with angina pectoris ([place] on blood pressure goal of less than 130/80, low-sodium, DASH diet, medication compliance, 150 minutes of moderate intensity exercise per week. Discussed medication compliance, adverse effects. -     CMP14+EGFR  Morbid obesity (HCC) Morbid Obesity is> 40 (Body mass index is 64.47 kg/m.) indicating an excess in caloric intake or underlining conditions. This had lead to  other co-morbidities - OSA, respiratory complications , DM and HTN. Lifestyle modifications of diet and exercise may reduce obesity.   Diabetes mellitus with complication ([place]) Managed by endocrinology   Acute pain of right shoulder -     celecoxib (CELEBREX) 100 MG capsule; Take 1 capsule (100 mg total) by mouth 2 (two) times daily.  Cervical cancer screening -     Ambulatory referral to Gynecology   Follow-up:  6 months labs/ fasting   [name], NP

## 2020-12-05 NOTE — Patient Instructions (Signed)

## 2020-12-06 ENCOUNTER — Other Ambulatory Visit (INDEPENDENT_AMBULATORY_CARE_PROVIDER_SITE_OTHER): Payer: Self-pay | Admitting: Primary Care

## 2020-12-06 DIAGNOSIS — E782 Mixed hyperlipidemia: Secondary | ICD-10-CM

## 2020-12-06 LAB — LIPID PANEL
Chol/HDL Ratio: 4 ratio (ref 0.0–4.4)
Cholesterol, Total: 182 mg/dL (ref 100–199)
HDL: 45 mg/dL (ref >39)
LDL Chol Calc (NIH): 97 mg/dL (ref 0–99)
Triglycerides: 239 mg/dL — ABNORMAL HIGH (ref 0–149)
VLDL Cholesterol Cal: 40 mg/dL (ref 5–40)

## 2020-12-06 LAB — CMP14+EGFR
ALT: 22 IU/L (ref 0–32)
AST: 32 IU/L (ref 0–40)
Albumin/Globulin Ratio: 1.2 (ref 1.2–2.2)
Albumin: 4 g/dL (ref 3.8–4.8)
Alkaline Phosphatase: 118 IU/L (ref 44–121)
BUN/Creatinine Ratio: 16 (ref 9–23)
BUN: 14 mg/dL (ref 6–20)
Bilirubin Total: <0.2 mg/dL (ref 0.0–1.2)
CO2: 23 mmol/L (ref 20–29)
Calcium: 9.6 mg/dL (ref 8.7–10.2)
Chloride: 100 mmol/L (ref 96–106)
Creatinine, Ser: 0.86 mg/dL (ref 0.57–1.00)
GFR calc Af Amer: 98 mL/min/{1.73_m2} (ref >59)
GFR calc non Af Amer: 85 mL/min/{1.73_m2} (ref >59)
Globulin, Total: 3.3 g/dL (ref 1.5–4.5)
Glucose: 240 mg/dL — ABNORMAL HIGH (ref 65–99)
Potassium: 4.3 mmol/L (ref 3.5–5.2)
Sodium: 140 mmol/L (ref 134–144)
Total Protein: 7.3 g/dL (ref 6.0–8.5)

## 2020-12-06 MED ORDER — ATORVASTATIN CALCIUM 40 MG PO TABS: 40.0000 mg | ORAL_TABLET | Freq: Every day | ORAL | 3 refills | Status: DC

## 2020-12-09 ENCOUNTER — Telehealth (INDEPENDENT_AMBULATORY_CARE_PROVIDER_SITE_OTHER): Payer: Self-pay | Admitting: Primary Care

## 2020-12-09 NOTE — Telephone Encounter (Signed)
[facility] calling back stating that they are faxing over the request again today. Please advise.

## 2020-12-10 NOTE — Telephone Encounter (Signed)
Request has been re-faxed.

## 2020-12-16 ENCOUNTER — Other Ambulatory Visit: Payer: Self-pay | Admitting: Family Medicine

## 2020-12-17 ENCOUNTER — Other Ambulatory Visit (INDEPENDENT_AMBULATORY_CARE_PROVIDER_SITE_OTHER): Payer: Self-pay | Admitting: Primary Care

## 2020-12-17 DIAGNOSIS — E1140 Type 2 diabetes mellitus with diabetic neuropathy, unspecified: Secondary | ICD-10-CM

## 2020-12-17 MED ORDER — BACLOFEN 10 MG PO TABS: 5.0000 mg | ORAL_TABLET | Freq: Three times a day (TID) | ORAL | 3 refills | Status: DC | PRN

## 2020-12-17 NOTE — Telephone Encounter (Signed)
Please advise 

## 2021-01-13 ENCOUNTER — Other Ambulatory Visit: Payer: Self-pay

## 2021-01-13 ENCOUNTER — Encounter: Payer: Self-pay | Admitting: Podiatry

## 2021-01-13 ENCOUNTER — Ambulatory Visit (INDEPENDENT_AMBULATORY_CARE_PROVIDER_SITE_OTHER): Payer: Medicaid Other | Admitting: Podiatry

## 2021-01-13 DIAGNOSIS — M79675 Pain in left toe(s): Secondary | ICD-10-CM

## 2021-01-13 DIAGNOSIS — L600 Ingrowing nail: Secondary | ICD-10-CM | POA: Diagnosis not present

## 2021-01-13 DIAGNOSIS — L03039 Cellulitis of unspecified toe: Secondary | ICD-10-CM

## 2021-01-13 DIAGNOSIS — B351 Tinea unguium: Secondary | ICD-10-CM | POA: Diagnosis not present

## 2021-01-13 DIAGNOSIS — M79674 Pain in right toe(s): Secondary | ICD-10-CM | POA: Diagnosis not present

## 2021-01-13 DIAGNOSIS — E1149 Type 2 diabetes mellitus with other diabetic neurological complication: Secondary | ICD-10-CM | POA: Diagnosis not present

## 2021-01-13 MED ORDER — CEPHALEXIN 500 MG PO CAPS: 500.0000 mg | ORAL_CAPSULE | Freq: Three times a day (TID) | ORAL | 0 refills | Status: AC

## 2021-01-13 NOTE — Patient Instructions (Signed)
EPSOM SALT FOOT SOAK INSTRUCTIONS  *IF YOU HAVE BEEN PRESCRIBED ANTIBIOTICS, TAKE AS INSTRUCTED UNTIL ALL ARE GONE*  Shopping List:  A. Plain epsom salt (not scented) B. Neosporin Cream/Ointment or Bacitracin Cream/Ointment (or prescribed antiobiotic drops/cream/ointment) C. 1-inch fabric band-aids  1.  Place 1/4 cup of epsom salts in 2 quarts of warm tap water. IF YOU ARE DIABETIC, OR HAVE NEUROPATHY, CHECK THE TEMPERATURE OF THE WATER WITH YOUR ELBOW.  2.  Submerge your foot/feet in the solution and soak for 10-15 minutes.      3.  Next, remove your foot/feet from solution, blot dry the affected area.    4.  Apply light amount of antibiotic cream/ointment and cover with fabric band-aid .  5.  This soak should be done once a day for 7 days.   6.  Monitor for any signs/symptoms of infection such as redness, swelling, odor, drainage, increased pain, or non-healing of digit.   7.  Please do not hesitate to call the office and speak to a Nurse or Doctor if you have questions.   8.  If you experience fever, chills, nightsweats, nausea or vomiting with worsening of digit, please go to the emergency room.   

## 2021-01-13 NOTE — Progress Notes (Signed)
Subjective: Lindsay [name] presents today for follow up of preventative diabetic foot care with and painful mycotic nails b/l.  Pain interferes with ambulation. Aggravating factors include wearing enclosed shoe gear. Pt also presents with callus(es) right foot which interfere with ambulation.   Patient states she is having bilateral great toe pain. She says she has noticed some bleeding from the great toes. She has not attempted treatment.   She relates her blood glucose was 219 mg/dl this morning. Patient states she ate a donut last night.  Allergies  Allergen Reactions  . Hydrazine Yellow [Tartrazine] Shortness Of Breath and Swelling    Swelling mostly noticed in legs and feet, retaining urination, shortness of breaht, and minor chest pain  . Lisinopril Shortness Of Breath    Was on prinzide; had sob/chest pain on it.  . Tylenol [Acetaminophen] Itching and Swelling    Itching of the mouth, swelling of tongue and stomach started hurting     Objective: There were no vitals filed for this visit.  Vascular Examination:  Capillary fill time to digits <3s b/l, palpable DP pulses b/l, faintly palpable PT pulses b/l, pedal hair present b/l, skin temperature gradient within normal limits b/l and nonpitting edema noted b/l LE  Dermatological Examination: Pedal skin with normal turgor, texture and tone bilaterally, no open wounds bilaterally, no interdigital macerations bilaterally, toenails 1-5 b/l elongated, dystrophic, thickened, crumbly with subungual debris.  Incurvated nailplates b/l great toes with nail border hypertrophy. There is tenderness to palpation. No purulence.  No erythema, no edema, no drainage, no fluctuance.  Musculoskeletal: Normal muscle strength 5/5 to all lower extremity muscle groups bilaterally, no gross bony deformities bilaterally and no pain crepitus or joint limitation noted with ROM b/l  Neurological: Protective sensation decreased with 10 gram monofilament  b/l  Assessment: 1. Pain due to onychomycosis of toenails of both feet   2. Paronychia of great toe   3. Ingrown nail of great toe of left foot   4. Ingrown nail of great toe of right foot   5. Type II diabetes mellitus with neurological manifestations (HCC)     Plan: -Continue diabetic foot care principles. Literature dispensed on today.  -Toenails 1-5 b/l were debrided in length and girth without iatrogenic bleeding. -Offending nail borders debrided and curretaged L hallux and R hallux. Borders cleansed with alcohol. Antibiotic ointment applied. Prescribed epsom salt/warm water soaks once daily for one week. She is to apply antibiotic ointment to both great toes after soaks. -Rx for Keflex 500 mg po tid x 7 days. Patient states she has no problem with Keflex and has taken it in the past with no problem. -We discussed ingrown toenail procedures, but her A1c is at 9 currently. I informed her she needs to be more controlled. She related understanding. -Patient to continue soft, supportive shoe gear daily. -Patient to report any pedal injuries to medical professional immediately. -Patient/POA to call should there be question/concern in the interim.  Return in about 2 weeks (around 01/27/2021).   Geralynn Rile, DPM

## 2021-01-15 DIAGNOSIS — Z794 Long term (current) use of insulin: Secondary | ICD-10-CM | POA: Diagnosis not present

## 2021-01-15 DIAGNOSIS — E1140 Type 2 diabetes mellitus with diabetic neuropathy, unspecified: Secondary | ICD-10-CM | POA: Diagnosis not present

## 2021-01-15 DIAGNOSIS — E1165 Type 2 diabetes mellitus with hyperglycemia: Secondary | ICD-10-CM | POA: Diagnosis not present

## 2021-01-22 ENCOUNTER — Other Ambulatory Visit (INDEPENDENT_AMBULATORY_CARE_PROVIDER_SITE_OTHER): Payer: Self-pay | Admitting: Primary Care

## 2021-01-22 ENCOUNTER — Other Ambulatory Visit: Payer: Self-pay | Admitting: Family Medicine

## 2021-01-22 DIAGNOSIS — J45909 Unspecified asthma, uncomplicated: Secondary | ICD-10-CM

## 2021-01-22 DIAGNOSIS — R202 Paresthesia of skin: Secondary | ICD-10-CM

## 2021-01-22 DIAGNOSIS — I10 Essential (primary) hypertension: Secondary | ICD-10-CM

## 2021-01-22 DIAGNOSIS — R200 Anesthesia of skin: Secondary | ICD-10-CM

## 2021-01-22 DIAGNOSIS — Z760 Encounter for issue of repeat prescription: Secondary | ICD-10-CM

## 2021-01-22 NOTE — Telephone Encounter (Signed)
Sent to PCP to refill if appropriate.  

## 2021-01-22 NOTE — Telephone Encounter (Signed)
   Notes to clinic:  script has expired  Review for use and refill   Requested Prescriptions  Pending Prescriptions Disp Refills   albuterol (VENTOLIN HFA) 108 (90 Base) MCG/ACT inhaler [Pharmacy Med Name: Albuterol Sulfate HFA 108 (90 Base) MCG/ACT Inhalation Aerosol Solution] 18 g 0    Sig: INHALE 2 PUFFS BY MOUTH EVERY 6 HOURS AS NEEDED FOR WHEEZING FOR SHORTNESS OF BREATH      Pulmonology:  Beta Agonists Failed - 01/22/2021  9:35 AM      Failed - One inhaler should last at least one month. If the patient is requesting refills earlier, contact the patient to check for uncontrolled symptoms.      Passed - Valid encounter within last 12 months    Recent Outpatient Visits           1 month ago Mixed hyperlipidemia   [facility] Grayce Sessions, NP   7 months ago Mixed hyperlipidemia   [facility] Grayce Sessions, NP   1 year ago Mixed hyperlipidemia   [facility] Grayce Sessions, NP   1 year ago OSA (obstructive sleep apnea)   [facility] Grayce Sessions, NP   1 year ago Chronic bilateral low back pain without sciatica   [facility] Grayce Sessions, NP

## 2021-01-23 MED ORDER — DULOXETINE HCL 30 MG PO CPEP
30.0000 mg | ORAL_CAPSULE | Freq: Every day | ORAL | 0 refills | Status: DC
Start: 2021-01-23 — End: 2021-04-07

## 2021-01-27 ENCOUNTER — Encounter: Payer: Self-pay | Admitting: Podiatry

## 2021-01-27 ENCOUNTER — Ambulatory Visit (INDEPENDENT_AMBULATORY_CARE_PROVIDER_SITE_OTHER): Payer: Medicaid Other | Admitting: Podiatry

## 2021-01-27 ENCOUNTER — Other Ambulatory Visit: Payer: Self-pay

## 2021-01-27 DIAGNOSIS — L03039 Cellulitis of unspecified toe: Secondary | ICD-10-CM | POA: Diagnosis not present

## 2021-01-27 DIAGNOSIS — L600 Ingrowing nail: Secondary | ICD-10-CM

## 2021-01-27 MED ORDER — CEPHALEXIN 500 MG PO CAPS: 500.0000 mg | ORAL_CAPSULE | Freq: Three times a day (TID) | ORAL | 0 refills | Status: DC

## 2021-01-29 ENCOUNTER — Telehealth (INDEPENDENT_AMBULATORY_CARE_PROVIDER_SITE_OTHER): Payer: Self-pay

## 2021-01-29 NOTE — Telephone Encounter (Signed)
Copied from CRM [id number]. Topic: General - Other >> Jan 29, 2021 12:33 PM Laural Benes, [place] C wrote: Reason for CRM: pt called in to follow up on C-Pap supply request. Pt says that she is in need of the hose, filter and mask for her current machine.  Pt says that [facility] stated that they are waiting for provider authorization in order to send out to pt. Pt says that she has been waiting 6 months.  Pt would like further assistance.     [phone] -- [facility]    Please assist pt further.

## 2021-01-30 ENCOUNTER — Other Ambulatory Visit (INDEPENDENT_AMBULATORY_CARE_PROVIDER_SITE_OTHER): Payer: Self-pay | Admitting: Primary Care

## 2021-01-30 DIAGNOSIS — G4733 Obstructive sleep apnea (adult) (pediatric): Secondary | ICD-10-CM

## 2021-01-30 NOTE — Progress Notes (Signed)
Subjective: Lindsay [name] presents today for follow up of ingrown toenails b/l borders b/l great toes. She states left great toe feels fine, but is still having pain in right great toe.   Patient states she took the Keflex with no problems.  Allergies  Allergen Reactions  . Hydrazine Yellow [Tartrazine] Shortness Of Breath and Swelling    Swelling mostly noticed in legs and feet, retaining urination, shortness of breaht, and minor chest pain  . Lisinopril Shortness Of Breath    Was on prinzide; had sob/chest pain on it.  . Tylenol [Acetaminophen] Itching and Swelling    Itching of the mouth, swelling of tongue and stomach started hurting     Objective: There were no vitals filed for this visit.  Vascular Examination:  Capillary fill time to digits <3s b/l, palpable DP pulses b/l, faintly palpable PT pulses b/l, pedal hair present b/l, skin temperature gradient within normal limits b/l and nonpitting edema noted b/l LE  Dermatological Examination: Pedal skin with normal turgor, texture and tone bilaterally, no open wounds bilaterally, no interdigital macerations bilaterally. Toenails 1-5 b/l recently debrided.  Incurvated nailplate medial border right great toe with nail border hypertrophy. There is tenderness to palpation. There was <1cc localized pinpoint purulence. No odor, no erythema, no edema.  Musculoskeletal: Normal muscle strength 5/5 to all lower extremity muscle groups bilaterally, no gross bony deformities bilaterally and no pain crepitus or joint limitation noted with ROM b/l  Neurological: Protective sensation decreased with 10 gram monofilament b/l  Assessment: 1. Ingrown nail of great toe of right foot   2. Paronychia of great toe     Plan: -Continue diabetic foot care principles. Literature dispensed on today.  -Offending nail borders debrided and curretaged R hallux. Borders cleansed with alcohol. Antibiotic ointment applied. Continue epsom salt/warm water  soaks once daily for one week. She is to apply antibiotic ointment to both great toes after soaks. -Refilled Rx for Keflex 500 mg po tid x 7 days. Patient states she has no problem with Keflex and has taken it in the past with no problem. -Patient to continue soft, supportive shoe gear daily. -Patient to report any pedal injuries to medical professional immediately. -Patient/POA to call should there be question/concern in the interim.  Return in about 3 months (around 04/26/2021).   Geralynn Rile, DPM

## 2021-02-11 ENCOUNTER — Other Ambulatory Visit (INDEPENDENT_AMBULATORY_CARE_PROVIDER_SITE_OTHER): Payer: Self-pay | Admitting: Primary Care

## 2021-02-11 DIAGNOSIS — Z794 Long term (current) use of insulin: Secondary | ICD-10-CM

## 2021-02-11 DIAGNOSIS — E1140 Type 2 diabetes mellitus with diabetic neuropathy, unspecified: Secondary | ICD-10-CM

## 2021-03-31 ENCOUNTER — Telehealth: Payer: Self-pay

## 2021-04-07 ENCOUNTER — Other Ambulatory Visit (INDEPENDENT_AMBULATORY_CARE_PROVIDER_SITE_OTHER): Payer: Self-pay | Admitting: Primary Care

## 2021-04-07 DIAGNOSIS — R202 Paresthesia of skin: Secondary | ICD-10-CM

## 2021-04-07 DIAGNOSIS — R200 Anesthesia of skin: Secondary | ICD-10-CM

## 2021-04-07 DIAGNOSIS — M25511 Pain in right shoulder: Secondary | ICD-10-CM

## 2021-04-07 NOTE — Telephone Encounter (Signed)
Sent to PCP ?

## 2021-04-10 MED ORDER — CELECOXIB 100 MG PO CAPS: 100.0000 mg | ORAL_CAPSULE | Freq: Two times a day (BID) | ORAL | 0 refills | Status: DC

## 2021-04-10 MED ORDER — DULOXETINE HCL 30 MG PO CPEP: 30.0000 mg | ORAL_CAPSULE | Freq: Every day | ORAL | 0 refills | Status: DC

## 2021-04-18 ENCOUNTER — Telehealth: Payer: Self-pay | Admitting: Family Medicine

## 2021-04-18 MED ORDER — BACLOFEN 10 MG PO TABS: 5.0000 mg | ORAL_TABLET | Freq: Three times a day (TID) | ORAL | 3 refills | Status: DC | PRN

## 2021-04-18 NOTE — Addendum Note (Signed)
Addended by: Lillia Carmel on: 04/18/2021 03:46 PM   Modules accepted: Orders

## 2021-04-18 NOTE — Telephone Encounter (Signed)
Sent!

## 2021-04-18 NOTE — Telephone Encounter (Signed)
Pt would like a refill on baclofen (LIORESAL) 10 MG tablet [[id number]]

## 2021-04-18 NOTE — Telephone Encounter (Signed)
Please advise 

## 2021-04-25 ENCOUNTER — Ambulatory Visit: Payer: Medicaid Other | Admitting: Podiatry

## 2021-04-25 ENCOUNTER — Encounter: Payer: Self-pay | Admitting: Podiatry

## 2021-04-25 ENCOUNTER — Other Ambulatory Visit: Payer: Self-pay

## 2021-04-25 DIAGNOSIS — M79675 Pain in left toe(s): Secondary | ICD-10-CM

## 2021-04-25 DIAGNOSIS — B351 Tinea unguium: Secondary | ICD-10-CM

## 2021-04-25 DIAGNOSIS — M79674 Pain in right toe(s): Secondary | ICD-10-CM

## 2021-04-25 DIAGNOSIS — E1149 Type 2 diabetes mellitus with other diabetic neurological complication: Secondary | ICD-10-CM

## 2021-04-25 DIAGNOSIS — E119 Type 2 diabetes mellitus without complications: Secondary | ICD-10-CM

## 2021-05-03 NOTE — Progress Notes (Signed)
ANNUAL DIABETIC FOOT EXAM  Subjective: Lindsay [name] presents today for for annual diabetic foot examination and painful thick toenails that are difficult to trim. Pain interferes with ambulation. Aggravating factors include wearing enclosed shoe gear. Pain is relieved with periodic professional debridement..  Patient relates 18 year h/o diabetes.  Patient denies any h/o foot wounds.  Patient does have symptoms of foot numbness.  Patient's blood sugar was 167 mg/dl this morning.   [name], NP is patient's PCP. Last visit was January or February, 2022.Marland Kitchen  Past Medical History:  Diagnosis Date  . ARF (acute renal failure) ([place]) 04/2015  . Asthma   . Cellulitis of right upper extremity   . Coronary artery disease   . Diabetes mellitus    insulin dependent  . Hyperlipidemia LDL goal <70   . Hypertension   . NSTEMI (non-ST elevated myocardial infarction) ([place]) 08/2016  . Obesity   . S/P angioplasty with stent 08/2016   DES to mLAD and PTCA only to 2nd diag ostium.    Patient Active Problem List   Diagnosis Date Noted  . Elevated liver enzymes 05/23/2020  . 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 Pedal edema 06/22/2019  . Hyperlipidemia 06/22/2019  . Anemia 06/22/2019  . Vitamin D deficiency 06/22/2019  . OSA (obstructive sleep apnea) 11/03/2016  . Coronary artery disease involving native coronary artery of native heart with angina pectoris ([place]) 09/11/2016  . Diabetes mellitus with complication ([place])   . Chest pain of uncertain etiology 15/40/0867  . Community acquired pneumonia 09/07/2016  . MI, old   . DM neuropathy, type II diabetes mellitus ([place]) 06/10/2016  . Morbid obesity ([place]) 06/10/2016  . Pressure ulcer 05/17/2015  . 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 ARF (acute renal failure) ([place])   . Septic arthritis ([place])   . Essential hypertension 04/30/2015  . Cellulitis 04/30/2015  . Abscess of right shoulder    Past Surgical History:  Procedure Laterality  Date  . CARDIAC CATHETERIZATION N/A 09/07/2016   Procedure: Left Heart Cath and Coronary Angiography;  Surgeon: [name], MD;  Location: [facility];  Service: Cardiovascular;  Laterality: N/A;  . CARDIAC CATHETERIZATION N/A 09/07/2016   Procedure: Coronary Stent Intervention;  Surgeon: [name], MD;  Location: [place] [facility];  Service: Cardiovascular;  Laterality: N/A;  . CARDIAC CATHETERIZATION N/A 09/07/2016   Procedure: Coronary Balloon Angioplasty;  Surgeon: [name], MD;  Location: [place] [facility];  Service: Cardiovascular;  Laterality: N/A;  . CESAREAN SECTION    . IRRIGATION AND DEBRIDEMENT SHOULDER Right 04/30/2015   Procedure: IRRIGATION AND DEBRIDEMENT SHOULDER;  Surgeon: Renette Butters, MD;  Location: [place];  Service: Orthopedics;  Laterality: Right;  . IRRIGATION AND DEBRIDEMENT SHOULDER Right 05/01/2015  . LEFT HEART CATH AND CORONARY ANGIOGRAPHY N/A 04/25/2020   Procedure: LEFT HEART CATH AND CORONARY ANGIOGRAPHY;  Surgeon: [name], MD;  Location: [facility];  Service: Cardiovascular;  Laterality: N/A;  . LEG SURGERY    . SHOULDER ARTHROSCOPY Right 04/30/2015   Procedure: ARTHROSCOPY SHOULDER;  Surgeon: Renette Butters, MD;  Location: [place];  Service: Orthopedics;  Laterality: Right;  . TONSILLECTOMY     Current Outpatient Medications on File Prior to Visit  Medication Sig Dispense Refill  . Accu-Chek FastClix Lancets MISC USE 1 TO CHECK GLUCOSE 4 TIMES DAILY 102 each 2  . albuterol (VENTOLIN HFA) 108 (90 Base) MCG/ACT inhaler INHALE 2 PUFFS BY MOUTH EVERY 6  HOURS AS NEEDED FOR WHEEZING FOR SHORTNESS OF BREATH 18 g 2  . amLODipine (NORVASC) 10 MG tablet Take 1 tablet by mouth once daily 90 tablet 0  . aspirin 81 MG tablet Take 1 tablet (81 mg total) by mouth daily. 30 tablet 11  . aspirin-acetaminophen-caffeine (EXCEDRIN MIGRAINE) 956-387-56 MG tablet Take 2 tablets by mouth every 6 (six) hours as needed for headache.    Marland Kitchen atorvastatin  (LIPITOR) 40 MG tablet Take 1 tablet (40 mg total) by mouth daily. 90 tablet 3  . atorvastatin (LIPITOR) 80 MG tablet Take 80 mg by mouth daily.    . baclofen (LIORESAL) 10 MG tablet Take 0.5-1 tablets by mouth 3 (three) times daily as needed.    . baclofen (LIORESAL) 10 MG tablet Take 0.5-1 tablets (5-10 mg total) by mouth 3 (three) times daily as needed for muscle spasms. 30 tablet 3  . BD PEN NEEDLE NANO 2ND GEN 32G X 4 MM MISC     . BD VEO INSULIN SYRINGE U/F 31G X 15/64" 1 ML MISC USE  SYRINGE ONCE DAILY 100 each 1  . Blood Glucose Monitoring Suppl (ACCU-CHEK AVIVA PLUS) w/Device KIT 1 each by Does not apply route as directed. 1 kit 0  . celecoxib (CELEBREX) 100 MG capsule Take 1 capsule (100 mg total) by mouth 2 (two) times daily. 180 capsule 0  . cephALEXin (KEFLEX) 500 MG capsule Take 1 capsule (500 mg total) by mouth 3 (three) times daily. 21 capsule 0  . Continuous Blood Gluc Receiver (Ivins) DEVI See admin instructions.    . Continuous Blood Gluc Sensor (DEXCOM G6 SENSOR) MISC Inject into the skin.    . Continuous Blood Gluc Transmit (DEXCOM G6 TRANSMITTER) MISC Inject into the skin.    Marland Kitchen diphenhydrAMINE (BENADRYL) 25 MG tablet Take 50 mg by mouth in the morning and at bedtime. Allergies    . diphenhydrAMINE (SOMINEX) 25 MG tablet Take by mouth.    . DULoxetine (CYMBALTA) 30 MG capsule Take 1 capsule (30 mg total) by mouth daily. 90 capsule 0  . empagliflozin (JARDIANCE) 10 MG TABS tablet Take 10 mg by mouth daily. 30 tablet 3  . fluticasone (FLOVENT HFA) 110 MCG/ACT inhaler Inhale 2 puffs into the lungs 2 (two) times daily. 1 Inhaler 12  . gabapentin (NEURONTIN) 300 MG capsule TAKE 2 CAPSULES BY MOUTH THREE TIMES DAILY 180 capsule 3  . glucose blood (ACCU-CHEK AVIVA PLUS) test strip Use as instructed to test blood sugar 4 times daily. 100 each 12  . HUMULIN R U-500 KWIKPEN 500 UNIT/ML kwikpen Inject into the skin.    . hydrochlorothiazide (HYDRODIURIL) 25 MG tablet Take 1  tablet (25 mg total) by mouth every morning. 90 tablet 1  . ibuprofen (ADVIL,MOTRIN) 200 MG tablet Take 400 mg by mouth every 6 (six) hours as needed for fever or moderate pain.    . Insulin Pen Needle (B-D UF III MINI PEN NEEDLES) 31G X 5 MM MISC Use as instructed. Monitor blood glucose levels three times per day 90 each 1  . insulin regular human CONCENTRATED (HUMULIN R) 500 UNIT/ML injection Inject 50 Units into the skin.    Marland Kitchen isosorbide mononitrate (IMDUR) 30 MG 24 hr tablet Take 0.5 tablets (15 mg total) by mouth daily. 15 tablet 11  . liraglutide (VICTOZA) 18 MG/3ML SOPN Inject 1.8 mg into the skin at bedtime.     Marland Kitchen losartan (COZAAR) 100 MG tablet Take 1 tablet by mouth once daily 90 tablet 3  .  Melatonin 10 MG TABS Take 3 tablets by mouth at bedtime.    . metoprolol tartrate (LOPRESSOR) 100 MG tablet Take 1 tablet (100 mg total) by mouth 2 (two) times daily. 60 tablet 6  . Multiple Vitamins-Minerals (MULTIVITAMIN PO) Take 1 tablet by mouth daily.    . nitroGLYCERIN (NITROSTAT) 0.4 MG SL tablet Place 1 tablet (0.4 mg total) under the tongue every 5 (five) minutes as needed for chest pain. 25 tablet 11  . Ostomy Supplies (SKIN TAC ADHESIVE BARRIER WIPE) MISC Dispense and use as directed     No current facility-administered medications on file prior to visit.    Allergies  Allergen Reactions  . Hydrazine Yellow [Tartrazine] Shortness Of Breath and Swelling    Swelling mostly noticed in legs and feet, retaining urination, shortness of breaht, and minor chest pain  . Lisinopril Shortness Of Breath    Was on prinzide; had sob/chest pain on it.  . Tylenol [Acetaminophen] Itching and Swelling    Itching of the mouth, swelling of tongue and stomach started hurting   Social History   Occupational History  . Not on file  Tobacco Use  . Smoking status: Never Smoker  . Smokeless tobacco: Never Used  Vaping Use  . Vaping Use: Never used  Substance and Sexual Activity  . Alcohol use: Yes     Comment: socially  . Drug use: No  . Sexual activity: Not on file   Family History  Problem Relation Age of Onset  . Diabetes Mother   . Hypertension Mother   . Heart disease Mother   . Diabetes Father   . Heart disease Father   . Stroke Maternal Grandmother   . Cancer Maternal Grandmother    Immunization History  Administered Date(s) Administered  . PFIZER(Purple Top)SARS-COV-2 Vaccination 11/30/2020  . Pneumococcal Polysaccharide-23 02/13/2015  . Tdap 07/28/2016     Review of Systems: Negative except as noted in the HPI.  Objective: There were no vitals filed for this visit.  Lindsay [name] is a pleasant 40 y.o. female in NAD. AAO X 3.  Vascular Examination: Capillary fill time to digits <3 seconds b/l lower extremities. Palpable DP pulse(s) b/l lower extremities Faintly palpable PT pulse(s) b/l lower extremities. Pedal hair present. Lower extremity skin temperature gradient within normal limits.  Dermatological Examination: Pedal skin with normal turgor, texture and tone bilaterally. No open wounds bilaterally. No interdigital macerations bilaterally. Toenails 1-5 b/l elongated, discolored, dystrophic, thickened, crumbly with subungual debris and tenderness to dorsal palpation.  Musculoskeletal Examination: Normal muscle strength 5/5 to all lower extremity muscle groups bilaterally. No pain crepitus or joint limitation noted with ROM b/l. No gross bony deformities bilaterally.  Footwear Assessment: Does the patient wear appropriate shoes? Yes. Does the patient need inserts/orthotics? No.  Neurological Examination: Protective sensation decreased with 10 gram monofilament b/l.  Assessment: 1. Pain due to onychomycosis of toenails of both feet   2. Type II diabetes mellitus with neurological manifestations ([place])   3. Encounter for diabetic foot exam ([place])     ADA Risk Categorization: High Risk  Patient has one or more of the following: Loss of protective  sensation Absent pedal pulses Severe Foot deformity History of foot ulcer   Plan: -Examined patient. -Diabetic foot examination performed on today's visit. -Patient to continue soft, supportive shoe gear daily. -Toenails 1-5 b/l were debrided in length and girth with sterile nail nippers and dremel without iatrogenic bleeding.  -Patient to report any pedal injuries to medical professional immediately. -  Patient/POA to call should there be question/concern in the interim.  Return in about 3 months (around 07/26/2021).  Marzetta Board, DPM

## 2021-05-09 ENCOUNTER — Ambulatory Visit: Payer: Medicaid Other | Admitting: Nurse Practitioner

## 2021-05-21 DIAGNOSIS — Z794 Long term (current) use of insulin: Secondary | ICD-10-CM | POA: Diagnosis not present

## 2021-05-21 DIAGNOSIS — E1140 Type 2 diabetes mellitus with diabetic neuropathy, unspecified: Secondary | ICD-10-CM | POA: Diagnosis not present

## 2021-05-21 DIAGNOSIS — E1165 Type 2 diabetes mellitus with hyperglycemia: Secondary | ICD-10-CM | POA: Diagnosis not present

## 2021-05-26 ENCOUNTER — Other Ambulatory Visit: Payer: Self-pay | Admitting: Physician Assistant

## 2021-05-27 ENCOUNTER — Other Ambulatory Visit (INDEPENDENT_AMBULATORY_CARE_PROVIDER_SITE_OTHER): Payer: Self-pay | Admitting: Primary Care

## 2021-05-27 DIAGNOSIS — Z760 Encounter for issue of repeat prescription: Secondary | ICD-10-CM

## 2021-05-27 DIAGNOSIS — R202 Paresthesia of skin: Secondary | ICD-10-CM

## 2021-05-27 DIAGNOSIS — I10 Essential (primary) hypertension: Secondary | ICD-10-CM

## 2021-05-27 DIAGNOSIS — R200 Anesthesia of skin: Secondary | ICD-10-CM

## 2021-05-28 NOTE — Telephone Encounter (Signed)
Sent to PCP to refill if appropriate.  

## 2021-06-02 MED ORDER — AMLODIPINE BESYLATE 10 MG PO TABS: 1.0000 | ORAL_TABLET | Freq: Every day | ORAL | 0 refills | Status: DC

## 2021-06-02 MED ORDER — DULOXETINE HCL 30 MG PO CPEP: 30.0000 mg | ORAL_CAPSULE | Freq: Every day | ORAL | 0 refills | Status: DC

## 2021-06-03 ENCOUNTER — Encounter: Payer: Self-pay | Admitting: Obstetrics

## 2021-06-03 ENCOUNTER — Other Ambulatory Visit: Payer: Self-pay | Admitting: Obstetrics

## 2021-06-03 ENCOUNTER — Other Ambulatory Visit: Payer: Self-pay

## 2021-06-03 ENCOUNTER — Ambulatory Visit (INDEPENDENT_AMBULATORY_CARE_PROVIDER_SITE_OTHER): Payer: Medicaid Other | Admitting: Obstetrics

## 2021-06-03 ENCOUNTER — Other Ambulatory Visit (HOSPITAL_COMMUNITY)
Admission: RE | Admit: 2021-06-03 | Discharge: 2021-06-03 | Disposition: A | Discharge status: Home / Self Care | Payer: Medicaid Other | Source: Ambulatory Visit | Attending: Obstetrics | Admitting: Obstetrics

## 2021-06-03 VITALS — BP 147/85 | HR 89 | Ht 67.5 in | Wt >= 6400 oz

## 2021-06-03 DIAGNOSIS — Z113 Encounter for screening for infections with a predominantly sexual mode of transmission: Secondary | ICD-10-CM | POA: Diagnosis not present

## 2021-06-03 DIAGNOSIS — Z01419 Encounter for gynecological examination (general) (routine) without abnormal findings: Secondary | ICD-10-CM | POA: Diagnosis not present

## 2021-06-03 DIAGNOSIS — N898 Other specified noninflammatory disorders of vagina: Secondary | ICD-10-CM

## 2021-06-03 DIAGNOSIS — E139 Other specified diabetes mellitus without complications: Secondary | ICD-10-CM | POA: Diagnosis not present

## 2021-06-03 DIAGNOSIS — I10 Essential (primary) hypertension: Secondary | ICD-10-CM | POA: Diagnosis not present

## 2021-06-03 DIAGNOSIS — Z6841 Body Mass Index (BMI) 40.0 and over, adult: Secondary | ICD-10-CM | POA: Diagnosis not present

## 2021-06-03 NOTE — Progress Notes (Signed)
Subjective:        Lindsay [name] is a 39 y.o. female here for a routine exam.  Current complaints: Vaginal discharge.    Personal health questionnaire:  Is patient Ashkenazi Jewish, have a family history of breast and/or ovarian cancer: no Is there a family history of uterine cancer diagnosed at age < 14, gastrointestinal cancer, urinary tract cancer, family member who is a Field seismologist syndrome-associated carrier: no Is the patient overweight and hypertensive, family history of diabetes, personal history of gestational diabetes, preeclampsia or PCOS: no Is patient over 8, have PCOS,  family history of premature CHD under age 45, diabetes, smoke, have hypertension or peripheral artery disease:  no At any time, has a partner hit, kicked or otherwise hurt or frightened you?: no Over the past 2 weeks, have you felt down, depressed or hopeless?: no Over the past 2 weeks, have you felt little interest or pleasure in doing things?:no   Gynecologic History Patient's last menstrual period was 05/08/2021 (exact date). Contraception: none Last Pap: 3 yrs ago. Results were: normal Last mammogram: n/a. Results were: n/a  Obstetric History OB History  No obstetric history on file.    Past Medical History:  Diagnosis Date   ARF (acute renal failure) ([place]) 04/2015   Asthma    Cellulitis of right upper extremity    Coronary artery disease    Diabetes mellitus    insulin dependent   Hyperlipidemia LDL goal <70    Hypertension    NSTEMI (non-ST elevated myocardial infarction) ([place]) 08/2016   Obesity    S/P angioplasty with stent 08/2016   DES to mLAD and PTCA only to 2nd diag ostium.     Past Surgical History:  Procedure Laterality Date   CARDIAC CATHETERIZATION N/A 09/07/2016   Procedure: Left Heart Cath and Coronary Angiography;  Surgeon: [name], MD;  Location: [facility];  Service: Cardiovascular;  Laterality: N/A;   CARDIAC CATHETERIZATION N/A 09/07/2016   Procedure:  Coronary Stent Intervention;  Surgeon: [name], MD;  Location: [facility];  Service: Cardiovascular;  Laterality: N/A;   CARDIAC CATHETERIZATION N/A 09/07/2016   Procedure: Coronary Balloon Angioplasty;  Surgeon: [name], MD;  Location: [facility];  Service: Cardiovascular;  Laterality: N/A;   CESAREAN SECTION     IRRIGATION AND DEBRIDEMENT SHOULDER Right 04/30/2015   Procedure: IRRIGATION AND DEBRIDEMENT SHOULDER;  Surgeon: Renette Butters, MD;  Location: [facility];  Service: Orthopedics;  Laterality: Right;   IRRIGATION AND DEBRIDEMENT SHOULDER Right 05/01/2015   LEFT HEART CATH AND CORONARY ANGIOGRAPHY N/A 04/25/2020   Procedure: LEFT HEART CATH AND CORONARY ANGIOGRAPHY;  Surgeon: [name], MD;  Location: [facility];  Service: Cardiovascular;  Laterality: N/A;   LEG SURGERY     SHOULDER ARTHROSCOPY Right 04/30/2015   Procedure: ARTHROSCOPY SHOULDER;  Surgeon: Renette Butters, MD;  Location: [facility];  Service: Orthopedics;  Laterality: Right;   TONSILLECTOMY       Current Outpatient Medications:    amLODipine (NORVASC) 10 MG tablet, Take 1 tablet (10 mg total) by mouth daily., Disp: 90 tablet, Rfl: 0   aspirin 81 MG tablet, Take 1 tablet (81 mg total) by mouth daily., Disp: 30 tablet, Rfl: 11   aspirin-acetaminophen-caffeine (EXCEDRIN MIGRAINE) 250-250-65 MG tablet, Take 2 tablets by mouth every 6 (six) hours as needed for headache., Disp: , Rfl:    atorvastatin (LIPITOR) 40 MG tablet, Take 1 tablet (40 mg total)  by mouth daily., Disp: 90 tablet, Rfl: 3   baclofen (LIORESAL) 10 MG tablet, Take 0.5-1 tablets by mouth 3 (three) times daily as needed., Disp: , Rfl:    BD PEN NEEDLE NANO 2ND GEN 32G X 4 MM MISC, , Disp: , Rfl:    Blood Glucose Monitoring Suppl (ACCU-CHEK AVIVA PLUS) w/Device KIT, 1 each by Does not apply route as directed., Disp: 1 kit, Rfl: 0   celecoxib (CELEBREX) 100 MG capsule, Take 1 capsule (100 mg total) by mouth 2 (two) times daily.,  Disp: 180 capsule, Rfl: 0   Continuous Blood Gluc Receiver ([place]) DEVI, See admin instructions., Disp: , Rfl:    Continuous Blood Gluc Sensor (DEXCOM G6 SENSOR) MISC, Inject into the skin., Disp: , Rfl:    Continuous Blood Gluc Transmit (DEXCOM G6 TRANSMITTER) MISC, Inject into the skin., Disp: , Rfl:    diphenhydrAMINE (BENADRYL) 25 MG tablet, Take 50 mg by mouth in the morning and at bedtime. Allergies, Disp: , Rfl:    diphenhydrAMINE (SOMINEX) 25 MG tablet, Take by mouth., Disp: , Rfl:    DULoxetine (CYMBALTA) 30 MG capsule, Take 1 capsule (30 mg total) by mouth daily., Disp: 90 capsule, Rfl: 0   fluticasone (FLOVENT HFA) 110 MCG/ACT inhaler, Inhale 2 puffs into the lungs 2 (two) times daily., Disp: 1 Inhaler, Rfl: 12   gabapentin (NEURONTIN) 300 MG capsule, TAKE 2 CAPSULES BY MOUTH THREE TIMES DAILY, Disp: 180 capsule, Rfl: 3   HUMULIN R U-500 KWIKPEN 500 UNIT/ML kwikpen, Inject into the skin., Disp: , Rfl:    hydrochlorothiazide (HYDRODIURIL) 25 MG tablet, Take 1 tablet (25 mg total) by mouth every morning., Disp: 90 tablet, Rfl: 1   ibuprofen (ADVIL,MOTRIN) 200 MG tablet, Take 400 mg by mouth every 6 (six) hours as needed for fever or moderate pain., Disp: , Rfl:    insulin regular human CONCENTRATED (HUMULIN R) 500 UNIT/ML injection, Inject 50 Units into the skin., Disp: , Rfl:    isosorbide mononitrate (IMDUR) 30 MG 24 hr tablet, Take 0.5 tablets (15 mg total) by mouth daily. Please make overdue appt with Dr. [name] before anymore refills. Thank you 2nd attempt, Disp: 7 tablet, Rfl: 0   liraglutide (VICTOZA) 18 MG/3ML SOPN, Inject 1.8 mg into the skin at bedtime. , Disp: , Rfl:    losartan (COZAAR) 100 MG tablet, Take 1 tablet by mouth once daily, Disp: 90 tablet, Rfl: 3   Melatonin 10 MG TABS, Take 3 tablets by mouth at bedtime., Disp: , Rfl:    metoprolol tartrate (LOPRESSOR) 100 MG tablet, Take 1 tablet (100 mg total) by mouth 2 (two) times daily., Disp: 60 tablet, Rfl: 6    Multiple Vitamins-Minerals (MULTIVITAMIN PO), Take 1 tablet by mouth daily., Disp: , Rfl:    nitroGLYCERIN (NITROSTAT) 0.4 MG SL tablet, Place 1 tablet (0.4 mg total) under the tongue every 5 (five) minutes as needed for chest pain., Disp: 25 tablet, Rfl: 11   Accu-Chek FastClix Lancets MISC, USE 1 TO CHECK GLUCOSE 4 TIMES DAILY, Disp: 102 each, Rfl: 2   albuterol (VENTOLIN HFA) 108 (90 Base) MCG/ACT inhaler, INHALE 2 PUFFS BY MOUTH EVERY 6 HOURS AS NEEDED FOR WHEEZING FOR SHORTNESS OF BREATH, Disp: 18 g, Rfl: 2   atorvastatin (LIPITOR) 80 MG tablet, Take 80 mg by mouth daily. (Patient not taking: Reported on 06/03/2021), Disp: , Rfl:    baclofen (LIORESAL) 10 MG tablet, Take 0.5-1 tablets (5-10 mg total) by mouth 3 (three) times daily as needed  for muscle spasms. (Patient not taking: Reported on 06/03/2021), Disp: 30 tablet, Rfl: 3   BD VEO INSULIN SYRINGE U/F 31G X 15/64" 1 ML MISC, USE  SYRINGE ONCE DAILY (Patient not taking: Reported on 06/03/2021), Disp: 100 each, Rfl: 1   cephALEXin (KEFLEX) 500 MG capsule, Take 1 capsule (500 mg total) by mouth 3 (three) times daily. (Patient not taking: Reported on 06/03/2021), Disp: 21 capsule, Rfl: 0   empagliflozin (JARDIANCE) 10 MG TABS tablet, Take 10 mg by mouth daily. (Patient not taking: Reported on 06/03/2021), Disp: 30 tablet, Rfl: 3   glucose blood (ACCU-CHEK AVIVA PLUS) test strip, Use as instructed to test blood sugar 4 times daily., Disp: 100 each, Rfl: 12   Insulin Pen Needle (B-D UF III MINI PEN NEEDLES) 31G X 5 MM MISC, Use as instructed. Monitor blood glucose levels three times per day (Patient not taking: Reported on 06/03/2021), Disp: 90 each, Rfl: 1   Ostomy Supplies (SKIN TAC ADHESIVE BARRIER WIPE) MISC, Dispense and use as directed (Patient not taking: Reported on 06/03/2021), Disp: , Rfl:  Allergies  Allergen Reactions   Hydrazine Yellow [Tartrazine] Shortness Of Breath and Swelling    Swelling mostly noticed in legs and feet, retaining  urination, shortness of breaht, and minor chest pain   Lisinopril Shortness Of Breath    Was on prinzide; had sob/chest pain on it.   Tylenol [Acetaminophen] Itching and Swelling    Itching of the mouth, swelling of tongue and stomach started hurting    Social History   Tobacco Use   Smoking status: Never   Smokeless tobacco: Never  Substance Use Topics   Alcohol use: Yes    Comment: socially    Family History  Problem Relation Age of Onset   Diabetes Mother    Hypertension Mother    Heart disease Mother    Diabetes Father    Heart disease Father    Stroke Maternal Grandmother    Cancer Maternal Grandmother       Review of Systems  Constitutional: negative for fatigue and weight loss Respiratory: negative for cough and wheezing Cardiovascular: negative for chest pain, fatigue and palpitations Gastrointestinal: negative for abdominal pain and change in bowel habits Musculoskeletal:negative for myalgias Neurological: negative for gait problems and tremors Behavioral/Psych: negative for abusive relationship, depression Endocrine: negative for temperature intolerance    Genitourinary:positive for vaginal discharge.  negative for abnormal menstrual periods, genital lesions, hot flashes, sexual problems  Integument/breast: negative for breast lump, breast tenderness, nipple discharge and skin lesion(s)    Objective:       BP (!) 147/85   Pulse 89   Ht 5' 7.5" (1.715 m)   Wt (!) 415 lb 11.2 oz (188.6 kg)   LMP 05/08/2021 (Exact Date)   BMI 64.15 kg/m  General:   Alert and no distress  Skin:   no rash or abnormalities  Lungs:   clear to auscultation bilaterally  Heart:   regular rate and rhythm, S1, S2 normal, no murmur, click, rub or gallop  Breasts:   normal without suspicious masses, skin or nipple changes or axillary nodes  Abdomen:  normal findings: no organomegaly, soft, non-tender and no hernia  Pelvis:  External genitalia: normal general appearance Urinary  system: urethral meatus normal and bladder without fullness, nontender Vaginal: normal without tenderness, induration or masses Cervix: normal appearance Adnexa: normal bimanual exam Uterus: anteverted and non-tender, normal size   Lab Review Urine pregnancy test Labs reviewed yes Radiologic studies reviewed no  I have spent a total of 20 minutes of face-to-face time, excluding clinical staff time, reviewing notes and preparing to see patient, ordering tests and/or medications, and counseling the patient.   Assessment:    1. Encounter for routine gynecological examination with Papanicolaou smear of cervix Rx: - Cytology - PAP( [facility])  2. Vaginal discharge Rx: - Cervicovaginal ancillary only( [facility])  3. Screening for STD (sexually transmitted disease) Rx: - HIV antibody (with reflex) - RPR  4. Diabetes 1.5, managed as type 2 ([place]) - managed by PCP  5. HTN (hypertension), benign - managed by PCP  6. Class 3 severe obesity without serious comorbidity with body mass index (BMI) of 60.0 to 69.9 in adult, unspecified obesity type ([place]) - weight reduction with the aid of caloric reduction, exercise and behavioral modification recommended     Plan:    Education reviewed: calcium supplements, depression evaluation, low fat, low cholesterol diet, safe sex/STD prevention, self breast exams, and weight bearing exercise. Contraception: none. Follow up in: 1 year.    Orders Placed This Encounter  Procedures   HIV antibody (with reflex)   RPR     Shelly Bombard, MD 06/03/2021 10:20 AM

## 2021-06-03 NOTE — Progress Notes (Signed)
Did not take BP meds this AM.  Denies current GYN concerns. Would like HIV testing.

## 2021-06-04 LAB — CERVICOVAGINAL ANCILLARY ONLY
Bacterial Vaginitis (gardnerella): POSITIVE — AB
Candida Glabrata: NEGATIVE
Candida Vaginitis: NEGATIVE
Chlamydia: NEGATIVE
Comment: NEGATIVE
Comment: NEGATIVE
Comment: NEGATIVE
Comment: NEGATIVE
Comment: NEGATIVE
Comment: NORMAL
Neisseria Gonorrhea: NEGATIVE
Trichomonas: NEGATIVE

## 2021-06-04 LAB — RPR: RPR Ser Ql: NONREACTIVE

## 2021-06-04 LAB — HIV ANTIBODY (ROUTINE TESTING W REFLEX): HIV Screen 4th Generation wRfx: NONREACTIVE

## 2021-06-05 ENCOUNTER — Other Ambulatory Visit: Payer: Self-pay | Admitting: Obstetrics

## 2021-06-05 ENCOUNTER — Telehealth: Payer: Self-pay

## 2021-06-05 DIAGNOSIS — B9689 Other specified bacterial agents as the cause of diseases classified elsewhere: Secondary | ICD-10-CM

## 2021-06-05 LAB — CYTOLOGY - PAP
Comment: NEGATIVE
Diagnosis: NEGATIVE
Diagnosis: REACTIVE
High risk HPV: NEGATIVE

## 2021-06-05 MED ORDER — METRONIDAZOLE 500 MG PO TABS: 500.0000 mg | ORAL_TABLET | Freq: Two times a day (BID) | ORAL | 2 refills | Status: DC

## 2021-06-05 NOTE — Telephone Encounter (Signed)
-----   Message from Brock Bad, MD sent at 06/05/2021  8:27 AM EDT ----- Flagyl Rx for BV

## 2021-06-05 NOTE — Telephone Encounter (Signed)
Results reviewed on mychart.

## 2021-06-11 ENCOUNTER — Ambulatory Visit: Payer: Medicaid Other | Admitting: Physician Assistant

## 2021-06-11 NOTE — Progress Notes (Deleted)
Cardiology Office Note:    Date:  06/11/2021   ID:  Lindsay [name], DOB 02-27-1981, MRN [id number]  PCP:  Lindsay Sessions, NP   [facility] Providers Cardiologist:  Lindsay Noe, MD { Click to update primary MD,subspecialty MD or APP then REFRESH:1}  ***  Referring MD: Lindsay Sessions, NP   Chief Complaint:  No chief complaint on file.    Patient Profile:    Lindsay [name] is a 41 y.o. female with:  Coronary artery disease S/p NSTEMI 9/17 >> DES to LAD, POBA to D2 Ischemic CM >> EF recovered EF 35-45 by LV gram during MI Echocardiogram 9/17: EF 55-60, ant-sept, ant, apical HK Morbid obesity Diabetes mellitus Hypertension Hyperlipidemia Palpitations     Prior CV studies: Cardiac catheterization 05/06/20 LAD mid stent patent w 20 ISR; D1 60, jailed D2 ost patent with 50 LCx prox 30; OM1 50 EF 55-65   Echocardiogram 09/08/16 Mild conc LVH, EF 55-60, ant-sept, ant, apical HK, trivial MR, normal RVSF   Cardiac catheterization 09/07/16 LAD mid 100; D2 ost 75 LCx prox 30; OM1 50, 50 EF 35-45 PCI: 3 x 18 mm Xience [place] DES to mid LAD; POBA to D2   History of Present Illness: Lindsay [name] was last seen in 5/21.  She returns for follow-up.        Past Medical History:  Diagnosis Date   ARF (acute renal failure) ([facility]) 04/2015   Asthma    Cellulitis of right upper extremity    Coronary artery disease    Diabetes mellitus    insulin dependent   Hyperlipidemia LDL goal <70    Hypertension    NSTEMI (non-ST elevated myocardial infarction) ([facility]) 08/2016   Obesity    S/P angioplasty with stent 08/2016   DES to mLAD and PTCA only to 2nd diag ostium.     Current Medications: No outpatient medications have been marked as taking for the 06/11/21 encounter (Appointment) with Lindsay [name] T, PA-C.     Allergies:   Hydrazine yellow [tartrazine], Lisinopril, and Tylenol [acetaminophen]   Social History   Tobacco Use   Smoking status: Never    Smokeless tobacco: Never  Vaping Use   Vaping Use: Never used  Substance Use Topics   Alcohol use: Yes    Comment: socially   Drug use: No     Family Hx: The patient's family history includes Cancer in her maternal grandmother; Diabetes in her father and mother; Heart disease in her father and mother; Hypertension in her mother; Stroke in her maternal grandmother.  ROS   EKGs/Labs/Other Test Reviewed:    EKG:  EKG is *** ordered today.  The ekg ordered today demonstrates ***  Recent Labs: 12/05/2020: ALT 22; BUN 14; Creatinine, Ser 0.86; Potassium 4.3; Sodium 140   Recent Lipid Panel Lab Results  Component Value Date/Time   CHOL 182 12/05/2020 09:28 AM   TRIG 239 (H) 12/05/2020 09:28 AM   HDL 45 12/05/2020 09:28 AM   LDLCALC 97 12/05/2020 09:28 AM      Risk Assessment/Calculations:   {Does this patient have ATRIAL FIBRILLATION?[phone]}  Physical Exam:    VS:  There were no vitals taken for this visit.    Wt Readings from Last 3 Encounters:  06/03/21 (!) 415 lb 11.2 oz (188.6 kg)  12/05/20 (!) 411 lb 9.6 oz (186.7 kg)  06/04/20 (!) 421 lb 9.6 oz (191.2 kg)     Physical Exam ***     ASSESSMENT &  PLAN:    1. Coronary artery disease involving native coronary artery of native heart with angina pectoris ([facility]) History of non-ST elevation myocardial infarction in 2017 treated the drug-eluting stent to the LAD and balloon angioplasty to the second diagonal.  Recent cardiac catheterization demonstrated patent LAD stent and moderate nonobstructive disease elsewhere.  Continued medical therapy has been recommended.  Continue aspirin, amlodipine, atorvastatin, metoprolol.  She has developed some nausea related to isosorbide.  I have asked her to reduce her isosorbide to 15 mg daily.  If her nausea does not improve, she can stop this medication.   2. Essential hypertension Blood pressure is uncontrolled.  Continue current dose of amlodipine, hydrochlorothiazide, losartan.   Adjust isosorbide as noted above due to side effects.  Increase metoprolol tartrate to 100 mg twice daily.  Follow-up in 3 to 4 months for continued risk factor modification management.   3. Mixed hyperlipidemia LDL optimal on most recent lab work.  Continue current Rx.   {Are you ordering a CV Procedure (e.g. stress test, cath, DCCV, TEE, etc)?   Press F2        :[id number]}    Dispo:  No follow-ups on file.   Medication Adjustments/Labs and Tests Ordered: Current medicines are reviewed at length with the patient today.  Concerns regarding medicines are outlined above.  Tests Ordered: No orders of the defined types were placed in this encounter.  Medication Changes: No orders of the defined types were placed in this encounter.   Signed, Lindsay Newcomer, PA-C  06/11/2021 1:59 PM    [facility] [address] [place], [place], [place]  [address] Phone: [phone]; Fax: [phone]

## 2021-06-19 ENCOUNTER — Encounter (INDEPENDENT_AMBULATORY_CARE_PROVIDER_SITE_OTHER): Payer: Self-pay | Admitting: Primary Care

## 2021-06-19 DIAGNOSIS — G4733 Obstructive sleep apnea (adult) (pediatric): Secondary | ICD-10-CM | POA: Diagnosis not present

## 2021-06-19 DIAGNOSIS — I10 Essential (primary) hypertension: Secondary | ICD-10-CM

## 2021-06-19 MED ORDER — LOSARTAN POTASSIUM 100 MG PO TABS: 100.0000 mg | ORAL_TABLET | Freq: Every day | ORAL | 0 refills | Status: DC

## 2021-06-19 MED ORDER — METOPROLOL TARTRATE 100 MG PO TABS: 100.0000 mg | ORAL_TABLET | Freq: Two times a day (BID) | ORAL | 6 refills | Status: DC

## 2021-06-19 MED ORDER — NITROGLYCERIN 0.4 MG SL SUBL: 0.4000 mg | SUBLINGUAL_TABLET | SUBLINGUAL | 2 refills | Status: DC | PRN

## 2021-06-19 MED ORDER — ISOSORBIDE MONONITRATE ER 30 MG PO TB24: 15.0000 mg | ORAL_TABLET | Freq: Every day | ORAL | 0 refills | Status: DC

## 2021-07-10 ENCOUNTER — Other Ambulatory Visit: Payer: Self-pay

## 2021-07-10 ENCOUNTER — Inpatient Hospital Stay (HOSPITAL_COMMUNITY)
Admission: EM | Admit: 2021-07-10 | Discharge: 2021-07-31 | DRG: 193 | Disposition: A | Discharge status: Home Health | Payer: Medicaid Other | Attending: Internal Medicine | Admitting: Internal Medicine

## 2021-07-10 ENCOUNTER — Emergency Department (HOSPITAL_COMMUNITY): Payer: Medicaid Other

## 2021-07-10 DIAGNOSIS — R0602 Shortness of breath: Secondary | ICD-10-CM | POA: Diagnosis not present

## 2021-07-10 DIAGNOSIS — R0600 Dyspnea, unspecified: Secondary | ICD-10-CM | POA: Diagnosis not present

## 2021-07-10 DIAGNOSIS — R0689 Other abnormalities of breathing: Secondary | ICD-10-CM | POA: Diagnosis not present

## 2021-07-10 DIAGNOSIS — I252 Old myocardial infarction: Secondary | ICD-10-CM | POA: Diagnosis not present

## 2021-07-10 DIAGNOSIS — Z8249 Family history of ischemic heart disease and other diseases of the circulatory system: Secondary | ICD-10-CM | POA: Diagnosis not present

## 2021-07-10 DIAGNOSIS — J189 Pneumonia, unspecified organism: Principal | ICD-10-CM | POA: Diagnosis present

## 2021-07-10 DIAGNOSIS — G4733 Obstructive sleep apnea (adult) (pediatric): Secondary | ICD-10-CM | POA: Diagnosis not present

## 2021-07-10 DIAGNOSIS — Z794 Long term (current) use of insulin: Secondary | ICD-10-CM

## 2021-07-10 DIAGNOSIS — D649 Anemia, unspecified: Secondary | ICD-10-CM | POA: Diagnosis not present

## 2021-07-10 DIAGNOSIS — Z955 Presence of coronary angioplasty implant and graft: Secondary | ICD-10-CM

## 2021-07-10 DIAGNOSIS — E1142 Type 2 diabetes mellitus with diabetic polyneuropathy: Secondary | ICD-10-CM | POA: Diagnosis present

## 2021-07-10 DIAGNOSIS — Z7984 Long term (current) use of oral hypoglycemic drugs: Secondary | ICD-10-CM

## 2021-07-10 DIAGNOSIS — I110 Hypertensive heart disease with heart failure: Secondary | ICD-10-CM | POA: Diagnosis not present

## 2021-07-10 DIAGNOSIS — Z6841 Body Mass Index (BMI) 40.0 and over, adult: Secondary | ICD-10-CM

## 2021-07-10 DIAGNOSIS — E1121 Type 2 diabetes mellitus with diabetic nephropathy: Secondary | ICD-10-CM | POA: Diagnosis not present

## 2021-07-10 DIAGNOSIS — E785 Hyperlipidemia, unspecified: Secondary | ICD-10-CM | POA: Diagnosis present

## 2021-07-10 DIAGNOSIS — Z833 Family history of diabetes mellitus: Secondary | ICD-10-CM

## 2021-07-10 DIAGNOSIS — Z888 Allergy status to other drugs, medicaments and biological substances status: Secondary | ICD-10-CM | POA: Diagnosis not present

## 2021-07-10 DIAGNOSIS — I5043 Acute on chronic combined systolic (congestive) and diastolic (congestive) heart failure: Secondary | ICD-10-CM | POA: Diagnosis present

## 2021-07-10 DIAGNOSIS — M7989 Other specified soft tissue disorders: Secondary | ICD-10-CM | POA: Diagnosis not present

## 2021-07-10 DIAGNOSIS — Z7982 Long term (current) use of aspirin: Secondary | ICD-10-CM

## 2021-07-10 DIAGNOSIS — E860 Dehydration: Secondary | ICD-10-CM | POA: Diagnosis present

## 2021-07-10 DIAGNOSIS — R0902 Hypoxemia: Secondary | ICD-10-CM | POA: Diagnosis not present

## 2021-07-10 DIAGNOSIS — J9601 Acute respiratory failure with hypoxia: Secondary | ICD-10-CM | POA: Diagnosis present

## 2021-07-10 DIAGNOSIS — Z79899 Other long term (current) drug therapy: Secondary | ICD-10-CM

## 2021-07-10 DIAGNOSIS — D638 Anemia in other chronic diseases classified elsewhere: Secondary | ICD-10-CM | POA: Diagnosis not present

## 2021-07-10 DIAGNOSIS — E1140 Type 2 diabetes mellitus with diabetic neuropathy, unspecified: Secondary | ICD-10-CM | POA: Diagnosis not present

## 2021-07-10 DIAGNOSIS — I255 Ischemic cardiomyopathy: Secondary | ICD-10-CM | POA: Diagnosis present

## 2021-07-10 DIAGNOSIS — R079 Chest pain, unspecified: Secondary | ICD-10-CM | POA: Diagnosis not present

## 2021-07-10 DIAGNOSIS — I5033 Acute on chronic diastolic (congestive) heart failure: Secondary | ICD-10-CM | POA: Diagnosis not present

## 2021-07-10 DIAGNOSIS — R0789 Other chest pain: Secondary | ICD-10-CM | POA: Diagnosis not present

## 2021-07-10 DIAGNOSIS — J45909 Unspecified asthma, uncomplicated: Secondary | ICD-10-CM | POA: Diagnosis not present

## 2021-07-10 DIAGNOSIS — E875 Hyperkalemia: Secondary | ICD-10-CM | POA: Diagnosis present

## 2021-07-10 DIAGNOSIS — R81 Glycosuria: Secondary | ICD-10-CM | POA: Diagnosis not present

## 2021-07-10 DIAGNOSIS — E782 Mixed hyperlipidemia: Secondary | ICD-10-CM | POA: Diagnosis not present

## 2021-07-10 DIAGNOSIS — R000 Tachycardia, unspecified: Secondary | ICD-10-CM | POA: Diagnosis not present

## 2021-07-10 DIAGNOSIS — I25119 Atherosclerotic heart disease of native coronary artery with unspecified angina pectoris: Secondary | ICD-10-CM | POA: Diagnosis not present

## 2021-07-10 DIAGNOSIS — E871 Hypo-osmolality and hyponatremia: Secondary | ICD-10-CM | POA: Diagnosis present

## 2021-07-10 DIAGNOSIS — Z791 Long term (current) use of non-steroidal anti-inflammatories (NSAID): Secondary | ICD-10-CM

## 2021-07-10 DIAGNOSIS — Z20822 Contact with and (suspected) exposure to covid-19: Secondary | ICD-10-CM | POA: Diagnosis present

## 2021-07-10 DIAGNOSIS — E662 Morbid (severe) obesity with alveolar hypoventilation: Secondary | ICD-10-CM | POA: Diagnosis present

## 2021-07-10 DIAGNOSIS — I10 Essential (primary) hypertension: Secondary | ICD-10-CM | POA: Diagnosis present

## 2021-07-10 DIAGNOSIS — Z9102 Food additives allergy status: Secondary | ICD-10-CM

## 2021-07-10 DIAGNOSIS — N179 Acute kidney failure, unspecified: Secondary | ICD-10-CM | POA: Diagnosis not present

## 2021-07-10 DIAGNOSIS — R509 Fever, unspecified: Secondary | ICD-10-CM | POA: Diagnosis not present

## 2021-07-10 DIAGNOSIS — Z886 Allergy status to analgesic agent status: Secondary | ICD-10-CM

## 2021-07-10 DIAGNOSIS — Z09 Encounter for follow-up examination after completed treatment for conditions other than malignant neoplasm: Secondary | ICD-10-CM

## 2021-07-10 DIAGNOSIS — I5031 Acute diastolic (congestive) heart failure: Secondary | ICD-10-CM | POA: Diagnosis not present

## 2021-07-10 LAB — TROPONIN I (HIGH SENSITIVITY): Troponin I (High Sensitivity): 12 ng/L (ref <18)

## 2021-07-10 LAB — APTT: aPTT: 31 seconds (ref 24–36)

## 2021-07-10 LAB — LACTIC ACID, PLASMA: Lactic Acid, Venous: 2.9 mmol/L (ref 0.5–1.9)

## 2021-07-10 LAB — PROTIME-INR
INR: 1.1 (ref 0.8–1.2)
Prothrombin Time: 14.2 seconds (ref 11.4–15.2)

## 2021-07-10 LAB — BRAIN NATRIURETIC PEPTIDE: B Natriuretic Peptide: 103.2 pg/mL — ABNORMAL HIGH (ref 0.0–100.0)

## 2021-07-10 LAB — I-STAT BETA HCG BLOOD, ED (MC, WL, AP ONLY): I-stat hCG, quantitative: <5 m[IU]/mL (ref <5)

## 2021-07-10 MED ORDER — ACETAMINOPHEN 500 MG PO TABS: 1000.0000 mg | ORAL_TABLET | ORAL | Status: DC

## 2021-07-10 MED ORDER — SODIUM CHLORIDE 0.9 % IV SOLN
500.0000 mg | INTRAVENOUS | Status: AC
  Administered 2021-07-10 – 2021-07-16 (×7): 500 mg via INTRAVENOUS
  Filled 2021-07-10 (×9): qty 500

## 2021-07-10 MED ORDER — LACTATED RINGERS IV SOLN: INTRAVENOUS | Status: DC

## 2021-07-10 MED ORDER — SODIUM CHLORIDE 0.9 % IV SOLN
2.0000 g | INTRAVENOUS | Status: AC
  Administered 2021-07-10 – 2021-07-16 (×7): 2 g via INTRAVENOUS
  Filled 2021-07-10: qty 20
  Filled 2021-07-10: qty 2
  Filled 2021-07-10 (×4): qty 20
  Filled 2021-07-10: qty 2
  Filled 2021-07-10: qty 20

## 2021-07-10 NOTE — ED Provider Notes (Signed)
Blood pressure (!) 175/90, pulse (!) 110, temperature (!) 101.9 F (38.8 C), temperature source Oral, resp. rate 16, height 5\' 7"  (1.702 m), weight (!) 198.4 kg, SpO2 100 %.  Assuming care from Dr. .  In short, Lindsay [name] is a 40 y.o. female with a chief complaint of Shortness of Breath .  Refer to the original H&P for additional details.  The current plan of care is to follow up on labs and admit. Favor PNA over CHF clinically. No septic shock, no lactate > 4 so will give more gentle IVF bolus to start and reassess with CHF history.    EKG Interpretation  Date/Time:  Thursday July 10 2021 23:10:07 EDT Ventricular Rate:  105 PR Interval:  147 QRS Duration: 88 QT Interval:  346 QTC Calculation: 458 R Axis:   48 Text Interpretation: Sinus tachycardia Low voltage, precordial leads Borderline T wave abnormalities Confirmed by [id number] [phone]) on 07/10/2021 11:16:44 PM       Discussed patient's case with [facility] to request admission. Patient and family (if present) updated with plan. Care transferred to [facility] service.  I reviewed all nursing notes, vitals, pertinent old records, EKGs, labs, imaging (as available).     [facility], MD 07/11/21 [phone]

## 2021-07-10 NOTE — ED Provider Notes (Signed)
[facility] EMERGENCY DEPARTMENT Provider Note   CSN: [id number] Arrival date & time: 07/10/21  2119     History Chief Complaint  Patient presents with   Shortness of Breath    Lindsay [name] is a 40 y.o. female.   Shortness of Breath  40 year old female PMHx CAD (NSTEMI s/p angioplasty with stenting 2017), ischemic cardiomyopathy (LVEF estimated 55 to 65% 04/2020) T2DM, HTN, HLD, asthma, OSA (nightly CPAP), presenting for SOB since last night.  Onset was gradual, now mild to moderate severity, constant, worsening, worsened with exertion and lying down.  Minimal improvement with albuterol x5 at home.  Associated symptoms include chest heaviness, chills, cough, L>R pedal edema.  When EMS arrived on scene, was found to be 90% on RA and tachypneic at rest, prompting initiation of 8 LPM O2 via simple mask.  No further medical concerns ongoing fevers, sweats, sore throat, rhinorrhea, palpitations, abdominal pain, N/V, bowel/bladder changes, focal paresthesias/weakness, headache, audiovisual changes.  History obtained from patient, family, EMS, chart review.    Past Medical History:  Diagnosis Date   ARF (acute renal failure) ([place]) 04/2015   Asthma    Cellulitis of right upper extremity    Coronary artery disease    Diabetes mellitus    insulin dependent   Hyperlipidemia LDL goal <70    Hypertension    NSTEMI (non-ST elevated myocardial infarction) ([place]) 08/2016   Obesity    S/P angioplasty with stent 08/2016   DES to mLAD and PTCA only to 2nd diag ostium.     Patient Active Problem List   Diagnosis Date Noted   CAP (community acquired pneumonia) 07/11/2021   Elevated liver enzymes 05/23/2020   Spondylosis of lumbar spine 06/22/2019   Pedal edema 06/22/2019   Hyperlipidemia 06/22/2019   Anemia 06/22/2019   Vitamin D deficiency 06/22/2019   OSA (obstructive sleep apnea) 11/03/2016   Coronary artery disease involving native coronary artery of native  heart with angina pectoris ([place]) 09/11/2016   Diabetes mellitus with complication (HCC)    Chest pain of uncertain etiology 41/32/4401   Community acquired pneumonia 09/07/2016   MI, old    DM neuropathy, type II diabetes mellitus ([place]) 06/10/2016   Morbid obesity ([place]) 06/10/2016   Pressure ulcer 05/17/2015   Anaerobic abscess ([place])    ARF (acute renal failure) (HCC)    Septic arthritis ([place])    Essential hypertension 04/30/2015   Cellulitis 04/30/2015   Abscess of right shoulder     Past Surgical History:  Procedure Laterality Date   CARDIAC CATHETERIZATION N/A 09/07/2016   Procedure: Left Heart Cath and Coronary Angiography;  Surgeon: [name], MD;  Location: [facility];  Service: Cardiovascular;  Laterality: N/A;   CARDIAC CATHETERIZATION N/A 09/07/2016   Procedure: Coronary Stent Intervention;  Surgeon: [name], MD;  Location: [facility];  Service: Cardiovascular;  Laterality: N/A;   CARDIAC CATHETERIZATION N/A 09/07/2016   Procedure: Coronary Balloon Angioplasty;  Surgeon: [name], MD;  Location: [facility];  Service: Cardiovascular;  Laterality: N/A;   CESAREAN SECTION     IRRIGATION AND DEBRIDEMENT SHOULDER Right 04/30/2015   Procedure: IRRIGATION AND DEBRIDEMENT SHOULDER;  Surgeon: Renette Butters, MD;  Location: [facility];  Service: Orthopedics;  Laterality: Right;   IRRIGATION AND DEBRIDEMENT SHOULDER Right 05/01/2015   LEFT HEART CATH AND CORONARY ANGIOGRAPHY N/A 04/25/2020   Procedure: LEFT HEART CATH AND CORONARY ANGIOGRAPHY;  Surgeon: [name], MD;  Location: [facility]  INVASIVE [facility] LAB;  Service: Cardiovascular;  Laterality: N/A;   LEG SURGERY     SHOULDER ARTHROSCOPY Right 04/30/2015   Procedure: ARTHROSCOPY SHOULDER;  Surgeon: Renette Butters, MD;  Location: [facility];  Service: Orthopedics;  Laterality: Right;   TONSILLECTOMY       OB History   No obstetric history on file.     Family History  Problem Relation Age of Onset    Diabetes Mother    Hypertension Mother    Heart disease Mother    Diabetes Father    Heart disease Father    Stroke Maternal Grandmother    Cancer Maternal Grandmother     Social History   Tobacco Use   Smoking status: Never   Smokeless tobacco: Never  Vaping Use   Vaping Use: Never used  Substance Use Topics   Alcohol use: Yes    Comment: socially   Drug use: No    Home Medications Prior to Admission medications   Medication Sig Start Date End Date Taking? Authorizing Provider  albuterol (VENTOLIN HFA) 108 (90 Base) MCG/ACT inhaler INHALE 2 PUFFS BY MOUTH EVERY 6 HOURS AS NEEDED FOR WHEEZING FOR SHORTNESS OF BREATH Patient taking differently: Inhale 2 puffs into the lungs every 6 (six) hours as needed for wheezing or shortness of breath. 01/23/21  Yes Charlott Rakes, MD  Accu-Chek FastClix Lancets MISC USE 1 TO CHECK GLUCOSE 4 TIMES DAILY 01/26/20   Charlott Rakes, MD  amLODipine (NORVASC) 10 MG tablet Take 1 tablet (10 mg total) by mouth daily. 06/02/21   [name], NP  aspirin 81 MG tablet Take 1 tablet (81 mg total) by mouth daily. 01/13/18   Clent Demark, PA-C  aspirin-acetaminophen-caffeine (EXCEDRIN MIGRAINE) [phone] MG tablet Take 2 tablets by mouth every 6 (six) hours as needed for headache.    [provider]  atorvastatin (LIPITOR) 40 MG tablet Take 1 tablet (40 mg total) by mouth daily. 12/06/20   [name], NP  atorvastatin (LIPITOR) 80 MG tablet Take 80 mg by mouth daily. Patient not taking: Reported on 06/03/2021 10/14/20   [provider]  baclofen (LIORESAL) 10 MG tablet Take 0.5-1 tablets by mouth 3 (three) times daily as needed. 01/09/21   [provider]  baclofen (LIORESAL) 10 MG tablet Take 0.5-1 tablets (5-10 mg total) by mouth 3 (three) times daily as needed for muscle spasms. Patient not taking: Reported on 06/03/2021 04/18/21   Hilts, Legrand Como, MD  BD PEN NEEDLE NANO 2ND GEN 32G X 4 MM MISC  09/30/20    [provider]  BD VEO INSULIN SYRINGE U/F 31G X 15/64" 1 ML MISC USE  SYRINGE ONCE DAILY Patient not taking: Reported on 06/03/2021 06/07/18   Elayne Snare, MD  Blood Glucose Monitoring Suppl (ACCU-CHEK AVIVA PLUS) w/Device KIT 1 each by Does not apply route as directed. 04/15/18   Elayne Snare, MD  celecoxib (CELEBREX) 100 MG capsule Take 1 capsule (100 mg total) by mouth 2 (two) times daily. 04/10/21   [name], NP  cephALEXin (KEFLEX) 500 MG capsule Take 1 capsule (500 mg total) by mouth 3 (three) times daily. Patient not taking: Reported on 06/03/2021 01/27/21   Marzetta Board, DPM  Continuous Blood Gluc Receiver ([place]) [place] See admin instructions. 06/17/20   [provider]  Continuous Blood Gluc Sensor (DEXCOM G6 SENSOR) MISC Inject into the skin. 06/29/20   [provider]  Continuous Blood Gluc Transmit (DEXCOM G6 TRANSMITTER) MISC Inject  into the skin. 05/29/20   [provider]  diphenhydrAMINE (BENADRYL) 25 MG tablet Take 50 mg by mouth in the morning and at bedtime. Allergies    [provider]  diphenhydrAMINE (SOMINEX) 25 MG tablet Take by mouth.    [provider]  DULoxetine (CYMBALTA) 30 MG capsule Take 1 capsule (30 mg total) by mouth daily. 06/02/21   [name], NP  empagliflozin (JARDIANCE) 10 MG TABS tablet Take 10 mg by mouth daily. Patient not taking: Reported on 06/03/2021 11/06/19   [name], NP  fluticasone (FLOVENT HFA) 110 MCG/ACT inhaler Inhale 2 puffs into the lungs 2 (two) times daily. 12/05/19   [name], NP  gabapentin (NEURONTIN) 300 MG capsule TAKE 2 CAPSULES BY MOUTH THREE TIMES DAILY 02/11/21   [name], NP  glucose blood (ACCU-CHEK AVIVA PLUS) test strip Use as instructed to test blood sugar 4 times daily. 01/11/19   [name], NP  HUMULIN R U-500 KWIKPEN 500 UNIT/ML kwikpen Inject into the skin. 09/30/20   [provider]   hydrochlorothiazide (HYDRODIURIL) 25 MG tablet Take 1 tablet (25 mg total) by mouth every morning. 12/05/19   [name], NP  ibuprofen (ADVIL,MOTRIN) 200 MG tablet Take 400 mg by mouth every 6 (six) hours as needed for fever or moderate pain.    [provider]  Insulin Pen Needle (B-D UF III MINI PEN NEEDLES) 31G X 5 MM MISC Use as instructed. Monitor blood glucose levels three times per day Patient not taking: Reported on 06/03/2021 04/13/19   [name], NP  insulin regular human CONCENTRATED (HUMULIN R) 500 UNIT/ML injection Inject 50 Units into the skin.    [provider]  isosorbide mononitrate (IMDUR) 30 MG 24 hr tablet Take 0.5 tablets (15 mg total) by mouth daily. Please keep upcoming appt with Cardiologist in October 2022 before anymore refills. Thank you Final attempt 06/19/21   [name], MD  liraglutide (VICTOZA) 18 MG/3ML SOPN Inject 1.8 mg into the skin at bedtime.  07/26/19   [provider]  losartan (COZAAR) 100 MG tablet Take 1 tablet (100 mg total) by mouth daily. Please keep upcoming appt in October 2022 with Cardiologist before anymore refills. Thank you Final Attempt 06/19/21   [name], MD  Melatonin 10 MG TABS Take 3 tablets by mouth at bedtime.    [provider]  metoprolol tartrate (LOPRESSOR) 100 MG tablet Take 1 tablet (100 mg total) by mouth 2 (two) times daily. Please keep upcoming appt in October 2022 with Cardiologist before anymore refills. Thank you Final Attempt 06/19/21   [name], MD  metroNIDAZOLE (FLAGYL) 500 MG tablet Take 1 tablet (500 mg total) by mouth 2 (two) times daily. 06/05/21   Shelly Bombard, MD  Multiple Vitamins-Minerals (MULTIVITAMIN PO) Take 1 tablet by mouth daily.    [provider]  nitroGLYCERIN (NITROSTAT) 0.4 MG SL tablet Place 1 tablet (0.4 mg total) under the tongue every 5 (five) minutes as needed for chest pain. Please keep upcoming appt in October 2022. Final  Attempt 06/19/21   [name], MD  Ostomy Supplies (SKIN TAC ADHESIVE BARRIER WIPE) MISC Dispense and use as directed Patient not taking: Reported on 06/03/2021 05/22/20   [provider]    Allergies    Hydrazine yellow [tartrazine], Lisinopril, and Tylenol [acetaminophen]  Review of Systems   Review of Systems  Respiratory:  Positive for shortness of breath.  All other systems reviewed and are negative.  Physical Exam Updated Vital Signs BP (!) 159/71   Pulse (!) 104   Temp (!) 101.1 F (38.4 C) (Oral)   Resp (!) 29   Ht 5' 7"  (1.702 m)   Wt (!) 198.4 kg   SpO2 98%   BMI 68.52 kg/m   Physical Exam Vitals reviewed.  Constitutional:      Appearance: She is not toxic-appearing.  HENT:     Head: Normocephalic and atraumatic.  Eyes:     Extraocular Movements: Extraocular movements intact.     Pupils: Pupils are equal, round, and reactive to light.  Neck:     Trachea: No tracheal deviation.  Cardiovascular:     Rate and Rhythm: Regular rhythm. Tachycardia present.     Heart sounds: No murmur heard.   No friction rub. No gallop.  Pulmonary:     Effort: Tachypnea present.     Breath sounds: Rales (scattered coarse breath soudns) present. No decreased breath sounds, wheezing or rhonchi.  Chest:     Chest wall: No deformity or tenderness.  Abdominal:     Palpations: Abdomen is soft.     Comments: Mildly pitting edema  Musculoskeletal:     Cervical back: Normal range of motion.     Right lower leg: No tenderness. Edema present.     Left lower leg: No tenderness. Edema present.  Skin:    General: Skin is warm and dry.  Neurological:     General: No focal deficit present.     Mental Status: She is alert and oriented to person, place, and time.  Psychiatric:        Mood and Affect: Mood normal.        Behavior: Behavior normal.    ED Results / Procedures / Treatments   Labs (all labs ordered are listed, but only abnormal results are displayed) Labs  Reviewed  LACTIC ACID, PLASMA - Abnormal; Notable for the following components:      Result Value   Lactic Acid, Venous 2.9 (*)    All other components within normal limits  LACTIC ACID, PLASMA - Abnormal; Notable for the following components:   Lactic Acid, Venous 2.4 (*)    All other components within normal limits  URINALYSIS, ROUTINE W REFLEX MICROSCOPIC - Abnormal; Notable for the following components:   Glucose, UA >=500 (*)    Hgb urine dipstick SMALL (*)    Protein, ur 100 (*)    All other components within normal limits  BRAIN NATRIURETIC PEPTIDE - Abnormal; Notable for the following components:   B Natriuretic Peptide 103.2 (*)    All other components within normal limits  BASIC METABOLIC PANEL - Abnormal; Notable for the following components:   Glucose, Bld 234 (*)    Calcium 8.7 (*)    All other components within normal limits  CBC - Abnormal; Notable for the following components:   WBC 10.9 (*)    RBC 3.77 (*)    Hemoglobin 9.7 (*)    HCT 32.0 (*)    MCH 25.7 (*)    All other components within normal limits  TROPONIN I (HIGH SENSITIVITY) - Abnormal; Notable for the following components:   Troponin I (High Sensitivity) 18 (*)    All other components within normal limits  RESP PANEL BY RT-PCR (FLU A&B, COVID) ARPGX2  CULTURE, BLOOD (ROUTINE X 2)  CULTURE, BLOOD (ROUTINE X 2)  URINE CULTURE  EXPECTORATED SPUTUM ASSESSMENT W GRAM STAIN, RFLX TO  RESP C  PROTIME-INR  APTT  CBC WITH DIFFERENTIAL/PLATELET  STREP PNEUMONIAE URINARY ANTIGEN  I-STAT BETA HCG BLOOD, ED ([facility], [facility], [facility] ONLY)  TROPONIN I (HIGH SENSITIVITY)    EKG EKG Interpretation  Date/Time:  Thursday July 10 2021 23:10:07 EDT Ventricular Rate:  105 PR Interval:  147 QRS Duration: 88 QT Interval:  346 QTC Calculation: 458 R Axis:   48 Text Interpretation: Sinus tachycardia Low voltage, precordial leads Borderline T wave abnormalities Confirmed by Nanda Quinton [phone]) on 07/10/2021 11:16:44  PM  Radiology DG Chest Port 1 View  Result Date: 07/10/2021 CLINICAL DATA:  Dyspnea, fever EXAM: PORTABLE CHEST 1 VIEW COMPARISON:  04/20/2018 FINDINGS: Pulmonary insufflation is normal and symmetric. There has developed diffuse interstitial infiltrate within the lungs, more prevalent within the mid and lower lung zones, atypical infection versus edema. No pneumothorax or pleural effusion. Cardiac size is within normal limits. No acute bone abnormality., IMPRESSION: Interval development of diffuse interstitial infiltrate, atypical infection versus edema. Electronically Signed   By: Fidela Salisbury MD   On: 07/10/2021 22:14    Procedures Procedures   Medications Ordered in ED Medications  lactated ringers infusion ( Intravenous New Bag/Given 07/10/21 2256)  cefTRIAXone (ROCEPHIN) 2 g in sodium chloride 0.9 % 100 mL IVPB (0 g Intravenous Stopped 07/11/21 0040)  azithromycin (ZITHROMAX) 500 mg in sodium chloride 0.9 % 250 mL IVPB (500 mg Intravenous New Bag/Given 07/10/21 2259)  lactated ringers bolus 1,000 mL (has no administration in time range)  ibuprofen (ADVIL) tablet 600 mg (600 mg Oral Given 07/11/21 0115)    ED Course  I have reviewed the triage vital signs and the nursing notes.  Pertinent labs & imaging results that were available during my care of the patient were reviewed by me and considered in my medical decision making (see chart for details).    MDM Rules/Calculators/A&P                         This is a 40 year old female with significant cardiovascular comorbidities including ACS with prior stenting, CHF, metabolic syndrome, presenting for new onset shortness of breath last night with associated infectious symptoms.  On exam, febrile to 101.49F, HDS, tachycardic with signs of fluid overload and scattered coarseness on auscultation.  All studies independently reviewed by myself, d/w the attending physician, factored into my MDM. -EKG: Sinus tach 109 bpm, normal axis, normal  intervals, no acute ST-T changes, essentially unchanged compared to previous from 04/2020. -CXR: Diffuse interstitial infiltrate, atypical infection versus edema -Lactate: 2.9 -BNP: 103.2 (21.5) -Unremarkable: Troponin x2, PT/PTT -Pending: Blood cultures, urine culture, COVID/influenza PCR, CBC, BMP  Presentation most concerning for pneumonia, possible early sepsis given tachycardia, fever, and elevated lactate, though maintaining pressures well.  Possible component of CHF exacerbation given elevation of BNP from baseline and fluid overloaded appearance.  Negative delta troponin, EKG explained tachycardia without any other red flag findings.  Less likely PE;Although she has tachycardia, mild hypoxia and cough, feel that these are better explained by pneumonia in the context of fever and CXR findings.  No tearing or posteriorly rating CP, pulse differences, nor FND.  She is HDS, not displaying tamponade physiology.  CXR without signs of PTX, effusion, or pneumoperitoneum.  No GI symptoms to suggest pathology such as pancreatitis, EGD pathology.  No overlying chest lesions to suggest shingles.  Nonreproducible with palpation suggesting his MSK CP.  Patient is calm, unlikely panic attack.  Sepsis work-up initiated, IVs placed, blood cultures  drawn, empiric antibiotics initiated for CAP.  Withholding fluids for time being given signs of fluid overload.  Plan for admission once basic labs resulted.  Discussed plan with patient who understands and agrees.  Patient HDS, nontoxic on reevaluation.  Course of care and plan discussed with oncoming team, to inpatient care was transferred.  Final Clinical Impression(s) / ED Diagnoses Final diagnoses:  Acute respiratory failure with hypoxia [facility])    Rx / DC Orders ED Discharge Orders     None        Levin Bacon, MD 07/11/21 0217    Lucrezia Starch, MD 07/13/21 1556

## 2021-07-10 NOTE — ED Triage Notes (Signed)
PT BIBA from home. Pt c/o SOB since last night. Hx of CHF, DM and Asthma. When medic arrived, patient at 90% on RA. Now on simple mask with 8L of supplemental oxygen. Patient used home medication of albuterol 5x prior to arrival. Patient gets extremely winded with activity. Ambulated from wheelchair to stretcher, but notably SOB after activity. Patient noted worsening bilateral edema in lower extremities. Per medic, patient noted to be tachypneic at rest, with about 30-40 breaths per minute. Vitals: 176/84, HR: 110.

## 2021-07-11 ENCOUNTER — Inpatient Hospital Stay (HOSPITAL_COMMUNITY): Payer: Medicaid Other

## 2021-07-11 ENCOUNTER — Encounter (HOSPITAL_COMMUNITY): Payer: Self-pay | Admitting: Internal Medicine

## 2021-07-11 DIAGNOSIS — R0602 Shortness of breath: Secondary | ICD-10-CM | POA: Diagnosis not present

## 2021-07-11 DIAGNOSIS — J9601 Acute respiratory failure with hypoxia: Secondary | ICD-10-CM

## 2021-07-11 DIAGNOSIS — J189 Pneumonia, unspecified organism: Secondary | ICD-10-CM | POA: Diagnosis not present

## 2021-07-11 DIAGNOSIS — J811 Chronic pulmonary edema: Secondary | ICD-10-CM | POA: Diagnosis not present

## 2021-07-11 LAB — BASIC METABOLIC PANEL
Anion gap: 11 (ref 5–15)
BUN: 7 mg/dL (ref 6–20)
CO2: 24 mmol/L (ref 22–32)
Calcium: 8.7 mg/dL — ABNORMAL LOW (ref 8.9–10.3)
Chloride: 100 mmol/L (ref 98–111)
Creatinine, Ser: 0.88 mg/dL (ref 0.44–1.00)
GFR, Estimated: >60 mL/min (ref >60)
Glucose, Bld: 234 mg/dL — ABNORMAL HIGH (ref 70–99)
Potassium: 4.1 mmol/L (ref 3.5–5.1)
Sodium: 135 mmol/L (ref 135–145)

## 2021-07-11 LAB — GLUCOSE, CAPILLARY
Glucose-Capillary: 239 mg/dL — ABNORMAL HIGH (ref 70–99)
Glucose-Capillary: 275 mg/dL — ABNORMAL HIGH (ref 70–99)

## 2021-07-11 LAB — CBC
HCT: 32 % — ABNORMAL LOW (ref 36.0–46.0)
Hemoglobin: 9.7 g/dL — ABNORMAL LOW (ref 12.0–15.0)
MCH: 25.7 pg — ABNORMAL LOW (ref 26.0–34.0)
MCHC: 30.3 g/dL (ref 30.0–36.0)
MCV: 84.9 fL (ref 80.0–100.0)
Platelets: 206 10*3/uL (ref 150–400)
RBC: 3.77 MIL/uL — ABNORMAL LOW (ref 3.87–5.11)
RDW: 14.5 % (ref 11.5–15.5)
WBC: 10.9 10*3/uL — ABNORMAL HIGH (ref 4.0–10.5)
nRBC: 0 % (ref 0.0–0.2)

## 2021-07-11 LAB — URINALYSIS, ROUTINE W REFLEX MICROSCOPIC
Bacteria, UA: NONE SEEN
Bilirubin Urine: NEGATIVE
Glucose, UA: >=500 mg/dL — AB
Ketones, ur: NEGATIVE mg/dL
Leukocytes,Ua: NEGATIVE
Nitrite: NEGATIVE
Protein, ur: 100 mg/dL — AB
Specific Gravity, Urine: 1.011 (ref 1.005–1.030)
pH: 6 (ref 5.0–8.0)

## 2021-07-11 LAB — TROPONIN I (HIGH SENSITIVITY)
Troponin I (High Sensitivity): 18 ng/L — ABNORMAL HIGH (ref <18)
Troponin I (High Sensitivity): 13 ng/L (ref <18)
Troponin I (High Sensitivity): 9 ng/L (ref <18)
Troponin I (High Sensitivity): 9 ng/L (ref <18)

## 2021-07-11 LAB — CBG MONITORING, ED
Glucose-Capillary: 207 mg/dL — ABNORMAL HIGH (ref 70–99)
Glucose-Capillary: 210 mg/dL — ABNORMAL HIGH (ref 70–99)

## 2021-07-11 LAB — C-REACTIVE PROTEIN: CRP: 26 mg/dL — ABNORMAL HIGH (ref <1.0)

## 2021-07-11 LAB — RESP PANEL BY RT-PCR (FLU A&B, COVID) ARPGX2
Influenza A by PCR: NEGATIVE
Influenza B by PCR: NEGATIVE
SARS Coronavirus 2 by RT PCR: NEGATIVE

## 2021-07-11 LAB — MRSA NEXT GEN BY PCR, NASAL: MRSA by PCR Next Gen: DETECTED — AB

## 2021-07-11 LAB — LACTIC ACID, PLASMA
Lactic Acid, Venous: 2.4 mmol/L (ref 0.5–1.9)
Lactic Acid, Venous: 1.5 mmol/L (ref 0.5–1.9)

## 2021-07-11 LAB — D-DIMER, QUANTITATIVE: D-Dimer, Quant: 0.88 ug/mL-FEU — ABNORMAL HIGH (ref 0.00–0.50)

## 2021-07-11 LAB — PROCALCITONIN: Procalcitonin: <0.1 ng/mL

## 2021-07-11 MED ORDER — ISOSORBIDE MONONITRATE ER 30 MG PO TB24: 30.0000 mg | ORAL_TABLET | Freq: Every day | ORAL | Status: DC

## 2021-07-11 MED ORDER — BUDESONIDE 0.25 MG/2ML IN SUSP
0.2500 mg | Freq: Two times a day (BID) | RESPIRATORY_TRACT | Status: DC
  Administered 2021-07-11 – 2021-07-31 (×40): 0.25 mg via RESPIRATORY_TRACT
  Filled 2021-07-11 (×41): qty 2

## 2021-07-11 MED ORDER — HYDRALAZINE HCL 50 MG PO TABS
50.0000 mg | ORAL_TABLET | Freq: Three times a day (TID) | ORAL | Status: DC
  Administered 2021-07-11 – 2021-07-12 (×3): 50 mg via ORAL
  Filled 2021-07-11 (×3): qty 1

## 2021-07-11 MED ORDER — ISOSORBIDE MONONITRATE ER 30 MG PO TB24
15.0000 mg | ORAL_TABLET | Freq: Every day | ORAL | Status: DC
  Administered 2021-07-11: 15 mg via ORAL
  Filled 2021-07-11: qty 1

## 2021-07-11 MED ORDER — BACLOFEN 5 MG HALF TABLET
5.0000 mg | ORAL_TABLET | Freq: Three times a day (TID) | ORAL | Status: DC | PRN
  Administered 2021-07-15 – 2021-07-27 (×12): 10 mg via ORAL
  Administered 2021-07-30: 5 mg via ORAL
  Administered 2021-07-31: 10 mg via ORAL
  Filled 2021-07-11 (×16): qty 2

## 2021-07-11 MED ORDER — LACTATED RINGERS IV BOLUS
1000.0000 mL | Freq: Once | INTRAVENOUS | Status: AC
  Administered 2021-07-11: 1000 mL via INTRAVENOUS

## 2021-07-11 MED ORDER — IBUPROFEN 400 MG PO TABS: 400.0000 mg | ORAL_TABLET | Freq: Two times a day (BID) | ORAL | Status: DC | PRN

## 2021-07-11 MED ORDER — ALBUTEROL SULFATE (2.5 MG/3ML) 0.083% IN NEBU
2.5000 mg | INHALATION_SOLUTION | RESPIRATORY_TRACT | Status: DC | PRN
  Administered 2021-07-11: 2.5 mg via RESPIRATORY_TRACT
  Filled 2021-07-11: qty 3

## 2021-07-11 MED ORDER — ISOSORBIDE MONONITRATE ER 30 MG PO TB24: 15.0000 mg | ORAL_TABLET | Freq: Once | ORAL | Status: DC

## 2021-07-11 MED ORDER — IPRATROPIUM-ALBUTEROL 0.5-2.5 (3) MG/3ML IN SOLN
3.0000 mL | Freq: Four times a day (QID) | RESPIRATORY_TRACT | Status: DC
  Administered 2021-07-11 (×3): 3 mL via RESPIRATORY_TRACT
  Filled 2021-07-11 (×4): qty 3

## 2021-07-11 MED ORDER — METOPROLOL TARTRATE 25 MG PO TABS
50.0000 mg | ORAL_TABLET | Freq: Once | ORAL | Status: AC
  Administered 2021-07-11: 50 mg via ORAL
  Filled 2021-07-11: qty 2

## 2021-07-11 MED ORDER — NITROGLYCERIN 2 % TD OINT
0.5000 [in_us] | TOPICAL_OINTMENT | Freq: Four times a day (QID) | TRANSDERMAL | Status: DC
  Administered 2021-07-11 – 2021-07-12 (×4): 0.5 [in_us] via TOPICAL
  Filled 2021-07-11: qty 1
  Filled 2021-07-11: qty 30

## 2021-07-11 MED ORDER — INSULIN ASPART 100 UNIT/ML IJ SOLN
0.0000 [IU] | Freq: Three times a day (TID) | INTRAMUSCULAR | Status: DC
  Administered 2021-07-11: 9 [IU] via SUBCUTANEOUS
  Administered 2021-07-11 – 2021-07-12 (×4): 3 [IU] via SUBCUTANEOUS
  Administered 2021-07-13: 1 [IU] via SUBCUTANEOUS
  Administered 2021-07-13 – 2021-07-15 (×7): 5 [IU] via SUBCUTANEOUS
  Administered 2021-07-16: 3 [IU] via SUBCUTANEOUS
  Administered 2021-07-16: 1 [IU] via SUBCUTANEOUS
  Administered 2021-07-16 – 2021-07-17 (×2): 2 [IU] via SUBCUTANEOUS
  Administered 2021-07-17: 3 [IU] via SUBCUTANEOUS
  Administered 2021-07-18: 2 [IU] via SUBCUTANEOUS
  Administered 2021-07-18: 3 [IU] via SUBCUTANEOUS
  Administered 2021-07-19: 1 [IU] via SUBCUTANEOUS
  Administered 2021-07-19: 5 [IU] via SUBCUTANEOUS
  Administered 2021-07-19 – 2021-07-20 (×2): 2 [IU] via SUBCUTANEOUS
  Administered 2021-07-20: 1 [IU] via SUBCUTANEOUS
  Administered 2021-07-20: 2 [IU] via SUBCUTANEOUS
  Administered 2021-07-21 (×2): 1 [IU] via SUBCUTANEOUS
  Administered 2021-07-22: 2 [IU] via SUBCUTANEOUS
  Administered 2021-07-22 (×2): 3 [IU] via SUBCUTANEOUS
  Administered 2021-07-23: 5 [IU] via SUBCUTANEOUS
  Administered 2021-07-23: 3 [IU] via SUBCUTANEOUS
  Administered 2021-07-24 (×3): 2 [IU] via SUBCUTANEOUS
  Administered 2021-07-25 (×3): 1 [IU] via SUBCUTANEOUS
  Administered 2021-07-26 (×2): 2 [IU] via SUBCUTANEOUS
  Administered 2021-07-27: 1 [IU] via SUBCUTANEOUS
  Administered 2021-07-27: 2 [IU] via SUBCUTANEOUS
  Administered 2021-07-28: 3 [IU] via SUBCUTANEOUS
  Administered 2021-07-28: 2 [IU] via SUBCUTANEOUS
  Administered 2021-07-28 – 2021-07-29 (×3): 3 [IU] via SUBCUTANEOUS
  Administered 2021-07-29: 5 [IU] via SUBCUTANEOUS
  Administered 2021-07-30: 3 [IU] via SUBCUTANEOUS
  Administered 2021-07-30 (×2): 5 [IU] via SUBCUTANEOUS
  Administered 2021-07-31: 3 [IU] via SUBCUTANEOUS
  Administered 2021-07-31: 2 [IU] via SUBCUTANEOUS

## 2021-07-11 MED ORDER — ONDANSETRON HCL 4 MG PO TABS: 4.0000 mg | ORAL_TABLET | Freq: Four times a day (QID) | ORAL | Status: DC | PRN

## 2021-07-11 MED ORDER — EMPAGLIFLOZIN 10 MG PO TABS
10.0000 mg | ORAL_TABLET | Freq: Every day | ORAL | Status: DC
  Administered 2021-07-16 – 2021-07-21 (×3): 10 mg via ORAL
  Filled 2021-07-11 (×21): qty 1

## 2021-07-11 MED ORDER — FUROSEMIDE 10 MG/ML IJ SOLN
40.0000 mg | Freq: Once | INTRAMUSCULAR | Status: AC
  Administered 2021-07-11: 40 mg via INTRAVENOUS
  Filled 2021-07-11: qty 4

## 2021-07-11 MED ORDER — ONDANSETRON HCL 4 MG/2ML IJ SOLN
4.0000 mg | Freq: Four times a day (QID) | INTRAMUSCULAR | Status: DC | PRN
  Administered 2021-07-12 – 2021-07-21 (×11): 4 mg via INTRAVENOUS
  Filled 2021-07-11 (×11): qty 2

## 2021-07-11 MED ORDER — METOPROLOL TARTRATE 25 MG PO TABS
25.0000 mg | ORAL_TABLET | Freq: Two times a day (BID) | ORAL | Status: DC
  Administered 2021-07-11: 25 mg via ORAL
  Filled 2021-07-11: qty 1

## 2021-07-11 MED ORDER — ALBUTEROL SULFATE HFA 108 (90 BASE) MCG/ACT IN AERS: 2.0000 | INHALATION_SPRAY | Freq: Four times a day (QID) | RESPIRATORY_TRACT | Status: DC | PRN

## 2021-07-11 MED ORDER — NITROGLYCERIN 0.4 MG SL SUBL
0.4000 mg | SUBLINGUAL_TABLET | SUBLINGUAL | Status: DC | PRN
  Administered 2021-07-12 (×4): 0.4 mg via SUBLINGUAL
  Filled 2021-07-11: qty 1

## 2021-07-11 MED ORDER — METOPROLOL TARTRATE 100 MG PO TABS
100.0000 mg | ORAL_TABLET | Freq: Two times a day (BID) | ORAL | Status: DC
  Administered 2021-07-11 – 2021-07-26 (×30): 100 mg via ORAL
  Filled 2021-07-11 (×6): qty 1
  Filled 2021-07-11: qty 2
  Filled 2021-07-11: qty 1
  Filled 2021-07-11: qty 2
  Filled 2021-07-11 (×13): qty 1
  Filled 2021-07-11: qty 2
  Filled 2021-07-11 (×9): qty 1

## 2021-07-11 MED ORDER — IBUPROFEN 200 MG PO TABS
400.0000 mg | ORAL_TABLET | Freq: Three times a day (TID) | ORAL | Status: DC | PRN
  Administered 2021-07-11: 400 mg via ORAL
  Filled 2021-07-11: qty 1

## 2021-07-11 MED ORDER — INSULIN ASPART 100 UNIT/ML IJ SOLN
0.0000 [IU] | Freq: Every day | INTRAMUSCULAR | Status: DC
  Administered 2021-07-11 – 2021-07-17 (×5): 3 [IU] via SUBCUTANEOUS
  Administered 2021-07-19 – 2021-07-27 (×3): 2 [IU] via SUBCUTANEOUS
  Administered 2021-07-28 – 2021-07-29 (×2): 4 [IU] via SUBCUTANEOUS
  Administered 2021-07-30: 3 [IU] via SUBCUTANEOUS

## 2021-07-11 MED ORDER — INSULIN REGULAR HUMAN (CONC) 500 UNIT/ML ~~LOC~~ SOPN: 25.0000 [IU] | PEN_INJECTOR | Freq: Three times a day (TID) | SUBCUTANEOUS | Status: DC

## 2021-07-11 MED ORDER — LACTATED RINGERS IV BOLUS (SEPSIS)
1000.0000 mL | Freq: Once | INTRAVENOUS | Status: AC
  Administered 2021-07-11: 1000 mL via INTRAVENOUS

## 2021-07-11 MED ORDER — INSULIN ASPART 100 UNIT/ML IJ SOLN
25.0000 [IU] | Freq: Three times a day (TID) | INTRAMUSCULAR | Status: DC
  Administered 2021-07-11 – 2021-07-12 (×2): 25 [IU] via SUBCUTANEOUS

## 2021-07-11 MED ORDER — ASPIRIN 81 MG PO CHEW
81.0000 mg | CHEWABLE_TABLET | Freq: Every day | ORAL | Status: DC
  Administered 2021-07-11 – 2021-07-31 (×21): 81 mg via ORAL
  Filled 2021-07-11 (×21): qty 1

## 2021-07-11 MED ORDER — METOPROLOL TARTRATE 25 MG PO TABS
50.0000 mg | ORAL_TABLET | Freq: Two times a day (BID) | ORAL | Status: DC
  Filled 2021-07-11: qty 2

## 2021-07-11 MED ORDER — LACTATED RINGERS IV SOLN: INTRAVENOUS | Status: DC

## 2021-07-11 MED ORDER — ATORVASTATIN CALCIUM 40 MG PO TABS
40.0000 mg | ORAL_TABLET | Freq: Every day | ORAL | Status: DC
  Administered 2021-07-11 – 2021-07-31 (×21): 40 mg via ORAL
  Filled 2021-07-11 (×21): qty 1

## 2021-07-11 MED ORDER — ENOXAPARIN SODIUM 100 MG/ML IJ SOSY
100.0000 mg | PREFILLED_SYRINGE | INTRAMUSCULAR | Status: DC
  Administered 2021-07-12 – 2021-07-31 (×20): 100 mg via SUBCUTANEOUS
  Filled 2021-07-11 (×23): qty 1

## 2021-07-11 MED ORDER — LOSARTAN POTASSIUM 50 MG PO TABS
25.0000 mg | ORAL_TABLET | Freq: Every day | ORAL | Status: DC
  Filled 2021-07-11: qty 1

## 2021-07-11 MED ORDER — IBUPROFEN 400 MG PO TABS
600.0000 mg | ORAL_TABLET | Freq: Once | ORAL | Status: AC
  Administered 2021-07-11: 600 mg via ORAL
  Filled 2021-07-11: qty 1

## 2021-07-11 MED ORDER — LIRAGLUTIDE 18 MG/3ML ~~LOC~~ SOPN: 1.8000 mg | PEN_INJECTOR | Freq: Every day | SUBCUTANEOUS | Status: DC

## 2021-07-11 MED ORDER — ASPIRIN-ACETAMINOPHEN-CAFFEINE 250-250-65 MG PO TABS
2.0000 | ORAL_TABLET | Freq: Four times a day (QID) | ORAL | Status: DC | PRN
  Administered 2021-07-13: 2 via ORAL
  Filled 2021-07-11 (×2): qty 2

## 2021-07-11 MED ORDER — HYDRALAZINE HCL 20 MG/ML IJ SOLN
10.0000 mg | Freq: Four times a day (QID) | INTRAMUSCULAR | Status: DC | PRN
  Administered 2021-07-12: 10 mg via INTRAVENOUS
  Filled 2021-07-11: qty 1

## 2021-07-11 NOTE — ED Notes (Signed)
Pt tachypneic, increased work of breathing. Resp called to assess pt at bedside and admitting provider paged to make aware. Pt maintaining oxygen saturation of 96% on 2L [place]

## 2021-07-11 NOTE — Sepsis Progress Note (Signed)
Following for sepsis monitoring ?

## 2021-07-11 NOTE — Progress Notes (Addendum)
PROGRESS NOTE                                                                                                                                                                                                             Patient Demographics:    Lindsay [name], is a 40 y.o. female, DOB - Jun 03, 1981, [id number]:[id number]  Outpatient Primary MD for the patient is Grayce Sessions, NP    LOS - 0  Admit date - 07/10/2021    Chief Complaint  Patient presents with   Shortness of Breath       Brief Narrative (HPI from H&P) -  Lindsay [name] is a 40 y.o. female with medical history significant of acute renal failure in 2016, asthma, RUE cellulitis, history of CAD, history of NSTEMI in 2017/status post angioplasty with stent, insulin requiring type II DM, hyperlipidemia, hypertension who is brought to the emergency department via EMS due to progressively worsening dyspnea since 07/09/2021 evening that did not respond to her home albuterol, along with fever, was diagnosed with CAP and admitted.   Subjective:    Lindsay [name] today has, No headache, No chest pain, No abdominal pain - No Nausea, No new weakness tingling or numbness, +ve cough and SOB.   Assessment  & Plan :     Acute Hypoxic Resp. Failure due to CAP  - she has been placed on appropriate antibiotics, continue supplemental oxygen, has been placed on nebulizer treatments scheduled and as needed.  Have encouraged the patient to sit up in chair in the daytime use I-S and flutter valve for pulmonary toiletry.  Will advance activity and titrate down oxygen as possible.    SpO2: 100 % O2 Flow Rate (L/min): 8 L/min  Addeundum - at 1.45 pm - paged patient SOB, RR 40/min, have placed her on BiPAP, Nebs, IV Lasix 80 mg (BNP>100 with Orthopnea) , NTG paste, repeat EKG stable, cycle Trop, STAT - TTE, Dimer and Leg [place], CTA if needed, get PCCM input - lungs sound relative clear -  STS     2.  Dehydration causing mild elevation in lactic acid levels.  Improved with hydration.  No sepsis.  3.  CAD s/p stenting in 2021.  Chest pain-free.  Minimal elevation of troponin due to mild demand mismatch from hypoxia caused by pneumonia, EKG is nonacute, placed on  aspirin and statin along with low-dose beta-blocker and Imdur as tolerated by her blood pressure.  4.  Dyslipidemia.  Placed on statin.  5.  Hypertension.  Blood pressure soft due to dehydration gradually improving currently on low-dose beta-blocker, ARB and Imdur.  Monitor and adjust.  6.  Morbid obesity with a BMI of 68 along with underlying OSA and OHS.  Daytime oxygen supplementation as above, nighttime CPAP scheduled.  7.  DM type II.  Continue home dose Jardiance and Victoza, U 500 dose from 75 Units 3 times daily reduced to 25 Units 3 times daily along with low-dose sliding scale.    Lab Results  Component Value Date   HGBA1C 8.4 (H) 04/12/2019   CBG (last 3)  No results for input(s): GLUCAP in the last 72 hours.      Condition - Extremely Guarded  Family Communication  :  daughter bedside  Code Status :  Full  Consults  :  None  PUD Prophylaxis : None   Procedures  :            Disposition Plan  :    Status is: Observation  Dispo: The patient is from: Home              Anticipated d/c is to: Home              Patient currently is not medically stable to d/c.   Difficult to place patient No  DVT Prophylaxis  :  Lovenox    Lab Results  Component Value Date   PLT 206 07/10/2021    Diet :  Diet Order             Diet heart healthy/carb modified Room service appropriate? Yes; Fluid consistency: Thin  Diet effective now                    Inpatient Medications  Scheduled Meds:  aspirin  81 mg Oral Daily   atorvastatin  40 mg Oral Daily   empagliflozin  10 mg Oral Daily   enoxaparin (LOVENOX) injection  100 mg Subcutaneous Q24H   insulin aspart  0-5 Units  Subcutaneous QHS   insulin aspart  0-9 Units Subcutaneous TID WC   insulin regular human CONCENTRATED  25 Units Subcutaneous TID WC   ipratropium-albuterol  3 mL Nebulization Q6H   isosorbide mononitrate  15 mg Oral Daily   liraglutide  1.8 mg Subcutaneous QHS   losartan  25 mg Oral Daily   metoprolol tartrate  50 mg Oral BID   Continuous Infusions:  azithromycin Stopped (07/11/21 0250)   cefTRIAXone (ROCEPHIN)  IV Stopped (07/11/21 0040)   lactated ringers 75 mL/hr at 07/11/21 0745   PRN Meds:.albuterol, aspirin-acetaminophen-caffeine, baclofen, ibuprofen, nitroGLYCERIN, ondansetron **OR** ondansetron (ZOFRAN) IV  Antibiotics  :    Anti-infectives (From admission, onward)    Start     Dose/Rate Route Frequency Ordered Stop   07/10/21 2200  cefTRIAXone (ROCEPHIN) 2 g in sodium chloride 0.9 % 100 mL IVPB        2 g 200 mL/hr over 30 Minutes Intravenous Every 24 hours 07/10/21 2153     07/10/21 2200  azithromycin (ZITHROMAX) 500 mg in sodium chloride 0.9 % 250 mL IVPB        500 mg 250 mL/hr over 60 Minutes Intravenous Every 24 hours 07/10/21 2153          Time Spent in minutes  30   Susa Raring M.D on 07/11/2021 at  11:33 AM  To page go to [email or web address]   [facility] -  Office  [phone]    See all Orders from today for further details    Objective:   Vitals:   07/11/21 0830 07/11/21 0900 07/11/21 0930 07/11/21 0938  BP: 129/80 140/83 (!) 149/134 140/83  Pulse: 84 82 90 94  Resp:  20    Temp:      TempSrc:      SpO2: 97% 100% 100%   Weight:      Height:        Wt Readings from Last 3 Encounters:  07/10/21 (!) 198.4 kg  06/03/21 (!) 188.6 kg  12/05/20 (!) 186.7 kg     Intake/Output Summary (Last 24 hours) at 07/11/2021 1133 Last data filed at 07/11/2021 1131 Gross per 24 hour  Intake 2000.23 ml  Output 2500 ml  Net -499.77 ml     Physical Exam  Awake Alert, No new F.N deficits, Normal affect [place].AT,PERRAL Supple Neck,No JVD, No  cervical lymphadenopathy appriciated.  Symmetrical Chest wall movement, Good air movement bilaterally, CTAB RRR,No Gallops,Rubs or new Murmurs, No Parasternal Heave +ve B.Sounds, Abd Soft, No tenderness, No organomegaly appriciated, No rebound - guarding or rigidity. No Cyanosis, Clubbing or edema, No new Rash or bruise        RN pressure injury documentation: Pressure Ulcer 05/17/15 Stage II -  Partial thickness loss of dermis presenting as a shallow open ulcer with a red, pink wound bed without slough. (Active)  05/17/15 0958  Location: Buttocks  Location Orientation: Right  Staging: Stage II -  Partial thickness loss of dermis presenting as a shallow open ulcer with a red, pink wound bed without slough.  Wound Description (Comments):   Present on Admission: Yes (patient stated present before admission to hospital)     Data Review:    CBC Recent Labs  Lab 07/10/21 2300  WBC 10.9*  HGB 9.7*  HCT 32.0*  PLT 206  MCV 84.9  MCH 25.7*  MCHC 30.3  RDW 14.5    Recent Labs  Lab 07/10/21 2149 07/10/21 2153 07/10/21 2155 07/10/21 2300 07/10/21 2353 07/11/21 0515  NA  --   --   --  135  --   --   K  --   --   --  4.1  --   --   CL  --   --   --  100  --   --   CO2  --   --   --  24  --   --   GLUCOSE  --   --   --  234*  --   --   BUN  --   --   --  7  --   --   CREATININE  --   --   --  0.88  --   --   CALCIUM  --   --   --  8.7*  --   --   LATICACIDVEN  --  2.9*  --   --  2.4* 1.5  INR 1.1  --   --   --   --   --   BNP  --   --  103.2*  --   --   --     ------------------------------------------------------------------------------------------------------------------ No results for input(s): CHOL, HDL, LDLCALC, TRIG, CHOLHDL, LDLDIRECT in the last 72 hours.  Lab Results  Component Value Date   HGBA1C 8.4 (H) 04/12/2019   ------------------------------------------------------------------------------------------------------------------ No results for  input(s):  TSH, T4TOTAL, T3FREE, THYROIDAB in the last 72 hours.  Invalid input(s): FREET3  Cardiac Enzymes No results for input(s): CKMB, TROPONINI, MYOGLOBIN in the last 168 hours.  Invalid input(s): CK ------------------------------------------------------------------------------------------------------------------    Component Value Date/Time   BNP 103.2 (H) 07/10/2021 2155    Micro Results Recent Results (from the past 240 hour(s))  Resp Panel by RT-PCR (Flu A&B, Covid) Nasopharyngeal Swab     Status: None   Collection Time: 07/10/21 10:54 PM   Specimen: Nasopharyngeal Swab; Nasopharyngeal(NP) swabs in vial transport medium  Result Value Ref Range Status   SARS Coronavirus 2 by RT PCR NEGATIVE NEGATIVE Final    Comment: (NOTE) SARS-CoV-2 target nucleic acids are NOT DETECTED.  The SARS-CoV-2 RNA is generally detectable in upper respiratory specimens during the acute phase of infection. The lowest concentration of SARS-CoV-2 viral copies this assay can detect is 138 copies/mL. A negative result does not preclude SARS-Cov-2 infection and should not be used as the sole basis for treatment or other patient management decisions. A negative result may occur with  improper specimen collection/handling, submission of specimen other than nasopharyngeal swab, presence of viral mutation(s) within the areas targeted by this assay, and inadequate number of viral copies(<138 copies/mL). A negative result must be combined with clinical observations, patient history, and epidemiological information. The expected result is Negative.  Fact Sheet for Patients:  BloggerCourse.com  Fact Sheet for Healthcare Providers:  SeriousBroker.it  This test is no t yet approved or cleared by the [place] FDA and  has been authorized for detection and/or diagnosis of SARS-CoV-2 by FDA under an Emergency Use Authorization (EUA). This EUA will remain  in  effect (meaning this test can be used) for the duration of the COVID-19 declaration under Section 564(b)(1) of the Act, 21 U.S.C.section 360bbb-3(b)(1), unless the authorization is terminated  or revoked sooner.       Influenza A by PCR NEGATIVE NEGATIVE Final   Influenza B by PCR NEGATIVE NEGATIVE Final    Comment: (NOTE) The Xpert Xpress SARS-CoV-2/FLU/RSV plus assay is intended as an aid in the diagnosis of influenza from Nasopharyngeal swab specimens and should not be used as a sole basis for treatment. Nasal washings and aspirates are unacceptable for Xpert Xpress SARS-CoV-2/FLU/RSV testing.  Fact Sheet for Patients: BloggerCourse.com  Fact Sheet for Healthcare Providers: SeriousBroker.it  This test is not yet approved or cleared by the [place] FDA and has been authorized for detection and/or diagnosis of SARS-CoV-2 by FDA under an Emergency Use Authorization (EUA). This EUA will remain in effect (meaning this test can be used) for the duration of the COVID-19 declaration under Section 564(b)(1) of the Act, 21 U.S.C. section 360bbb-3(b)(1), unless the authorization is terminated or revoked.  Performed at [facility], [address] [place], [place] [address]     Radiology Reports DG Chest [place] 1 View  Result Date: 07/10/2021 CLINICAL DATA:  Dyspnea, fever EXAM: PORTABLE CHEST 1 VIEW COMPARISON:  04/20/2018 FINDINGS: Pulmonary insufflation is normal and symmetric. There has developed diffuse interstitial infiltrate within the lungs, more prevalent within the mid and lower lung zones, atypical infection versus edema. No pneumothorax or pleural effusion. Cardiac size is within normal limits. No acute bone abnormality., IMPRESSION: Interval development of diffuse interstitial infiltrate, atypical infection versus edema. Electronically Signed   By: Helyn Numbers MD   On: 07/10/2021 22:14

## 2021-07-11 NOTE — Consult Note (Addendum)
NAME:  Lindsay [name], MRN:  [id number], DOB:  [birth year], LOS: 0 ADMISSION DATE:  07/10/2021, CONSULTATION DATE:  7/22 REFERRING MD:  Dr. Candiss Norse, CHIEF COMPLAINT:  SOB   History of Present Illness:  Patient is a 40 yo F w/ PMH of acute renal failure, asthma, RUE cellulitis,CAD, NSTEMI in 2017/status post angioplasty with stent, IDDM2, hyperlipidemia, hypertension, BMI 68, OSA on CPAP present to [facility] for SOB on 7/21.   Patient states her sob began on 7/19 and has gradually worsened. She wears CPAP at night and has used albuterol without relief. She states she had a fever and chills prior to admission but didn't take temperature. Denies any recent travel or sick contacts. Denies chest pain, palpitations, wheezing. Does get BLE extremity edema.   ED course: Fever 101 F, WBC 10.9, covid/flu negative, BC pending. EKG shows sinus tachy. Hypertensive with systolic 124P. O2 sats 100% on 8 l/m simple mask. CXR shows diffuse interstitial infiltrate infection vs edema.   PCCM consulted for increasing O2 requirements and Bipap management.   Pertinent  Medical History   Past Medical History:  Diagnosis Date   ARF (acute renal failure) ([place]) 04/2015   Asthma    Cellulitis of right upper extremity    Coronary artery disease    Diabetes mellitus    insulin dependent   Hyperlipidemia LDL goal <70    Hypertension    NSTEMI (non-ST elevated myocardial infarction) ([place]) 08/2016   Obesity    S/P angioplasty with stent 08/2016   DES to mLAD and PTCA only to 2nd diag ostium.      Significant Hospital Events: Including procedures, antibiotic start and stop dates in addition to other pertinent events   7/21: patient admitted to [facility] for Acute resp failure with hypoxia CAP vs. Pulm edema requiring bipap.  Interim History / Subjective:  Patient currently on BiPAP 30%. States she feels much better after receiving a dose of lasix.   Objective   Blood pressure (!) 166/86, pulse (!) 101, temperature (!)  97 F (36.1 C), temperature source Oral, resp. rate (!) 32, height 5' 7"  (1.702 m), weight (!) 198.4 kg, SpO2 93 %.        Intake/Output Summary (Last 24 hours) at 07/11/2021 1403 Last data filed at 07/11/2021 1131 Gross per 24 hour  Intake 2000.23 ml  Output 2500 ml  Net -499.77 ml   Filed Weights   07/10/21 2135  Weight: (!) 198.4 kg    Examination: General:  ill appearing female in NAD on BiPAP HEENT: MM pink/moist; BiPAP mask in place Neuro: Aox4; MAE CV: s1s2, sinus tachy, no m/r/g PULM:  dim clear BS bilaterally; on BiPAP 30% with o2 sats high 90s GI: soft, bsx4 active  Extremities: warm/dry, 2+ edema  Skin: no rashes or lesions    Resolved Hospital Problem list     Assessment & Plan:  CAP vs pulm edema Asthma poa OSA poa on cpap at home Class III obesity P: -continue abx ppx for cap coverage; consider stopping; procalcitonin low -follow trach cultures and blood cultures -continue duoneb q6; will add pulmicort -continue lasix -continue bipap qhs and prn -follow CXR -Daily weights/trend bmp/uop -strict INO -Echo pending  Anemia P: -trend CBC per primary -transfuse for hgb < 7  HTN, CAD, IDDM2, HLD P: -per primary  Best Practice (right click and "Reselect all SmartList Selections" daily)   Diet/type: NPO w/ oral meds DVT prophylaxis: LMWH GI prophylaxis: N/A Lines: N/A Foley:  N/A Code Status:  full code Last date of multidisciplinary goals of care discussion [7/22 patient updated at bedside]  Labs   CBC: Recent Labs  Lab 07/10/21 2300  WBC 10.9*  HGB 9.7*  HCT 32.0*  MCV 84.9  PLT 376    Basic Metabolic Panel: Recent Labs  Lab 07/10/21 2300  NA 135  K 4.1  CL 100  CO2 24  GLUCOSE 234*  BUN 7  CREATININE 0.88  CALCIUM 8.7*   GFR: Estimated Creatinine Clearance: 157.6 mL/min (by C-G formula based on SCr of 0.88 mg/dL). Recent Labs  Lab 07/10/21 2153 07/10/21 2300 07/10/21 2353 07/11/21 0515 07/11/21 0614  PROCALCITON   --   --   --   --  <0.10  WBC  --  10.9*  --   --   --   LATICACIDVEN 2.9*  --  2.4* 1.5  --     Liver Function Tests: No results for input(s): AST, ALT, ALKPHOS, BILITOT, PROT, ALBUMIN in the last 168 hours. No results for input(s): LIPASE, AMYLASE in the last 168 hours. No results for input(s): AMMONIA in the last 168 hours.  ABG    Component Value Date/Time   HCO3 18.8 (L) 12/14/2014 1602   TCO2 20 12/14/2014 1602   ACIDBASEDEF 3.0 (H) 12/14/2014 1602   O2SAT 85.0 12/14/2014 1602     Coagulation Profile: Recent Labs  Lab 07/10/21 2149  INR 1.1    Cardiac Enzymes: No results for input(s): CKTOTAL, CKMB, CKMBINDEX, TROPONINI in the last 168 hours.  HbA1C: HbA1c, POC (controlled diabetic range)  Date/Time Value Ref Range Status  01/11/2019 11:00 AM 10.1 (A) 0.0 - 7.0 % Final   Hgb A1c MFr Bld  Date/Time Value Ref Range Status  04/12/2019 10:17 AM 8.4 (H) 4.8 - 5.6 % Final    Comment:             Prediabetes: 5.7 - 6.4          Diabetes: >6.4          Glycemic control for adults with diabetes: <7.0   04/12/2018 11:15 AM 9.0 (H) 4.6 - 6.5 % Final    Comment:    Glycemic Control Guidelines for People with Diabetes:Non Diabetic:  <6%Goal of Therapy: <7%Additional Action Suggested:  >8%     CBG: Recent Labs  Lab 07/11/21 0742 07/11/21 1200  GLUCAP 207* 210*    Review of Systems:   Positives in bold  Gen: fever, chills, increased weight change, fatigue, night sweats HEENT:  blurred vision, double vision, hearing loss, tinnitus, sinus congestion, rhinorrhea, sore throat, neck stiffness, dysphagia PULM:  gradual shortness of breath since 7/19, cough, sputum production, hemoptysis, wheezing CV: chest pain, edema, orthopnea, paroxysmal nocturnal dyspnea, palpitations GI:  abdominal pain, nausea, vomiting, diarrhea, hematochezia, melena, constipation, change in bowel habits GU: dysuria, hematuria, polyuria, oliguria, urethral discharge Endocrine: hot or cold  intolerance, polyuria, polyphagia or appetite change Derm: rash, dry skin, scaling or peeling skin change Heme: easy bruising, bleeding, bleeding gums Neuro: headache, numbness, weakness, slurred speech, loss of memory or consciousness   Past Medical History:  She,  has a past medical history of ARF (acute renal failure) ([place]) (04/2015), Asthma, Cellulitis of right upper extremity, Coronary artery disease, Diabetes mellitus, Hyperlipidemia LDL goal <70, Hypertension, NSTEMI (non-ST elevated myocardial infarction) ([place]) (08/2016), Obesity, and S/P angioplasty with stent (08/2016).   Surgical History:   Past Surgical History:  Procedure Laterality Date   CARDIAC CATHETERIZATION N/A 09/07/2016   Procedure: Left Heart Cath and  Coronary Angiography;  Surgeon: [name], MD;  Location: [place] [facility];  Service: Cardiovascular;  Laterality: N/A;   CARDIAC CATHETERIZATION N/A 09/07/2016   Procedure: Coronary Stent Intervention;  Surgeon: [name], MD;  Location: [place] [facility];  Service: Cardiovascular;  Laterality: N/A;   CARDIAC CATHETERIZATION N/A 09/07/2016   Procedure: Coronary Balloon Angioplasty;  Surgeon: [name], MD;  Location: [place] [facility];  Service: Cardiovascular;  Laterality: N/A;   CESAREAN SECTION     IRRIGATION AND DEBRIDEMENT SHOULDER Right 04/30/2015   Procedure: IRRIGATION AND DEBRIDEMENT SHOULDER;  Surgeon: Renette Butters, MD;  Location: [place];  Service: Orthopedics;  Laterality: Right;   IRRIGATION AND DEBRIDEMENT SHOULDER Right 05/01/2015   LEFT HEART CATH AND CORONARY ANGIOGRAPHY N/A 04/25/2020   Procedure: LEFT HEART CATH AND CORONARY ANGIOGRAPHY;  Surgeon: [name], MD;  Location: [place] [facility];  Service: Cardiovascular;  Laterality: N/A;   LEG SURGERY     SHOULDER ARTHROSCOPY Right 04/30/2015   Procedure: ARTHROSCOPY SHOULDER;  Surgeon: Renette Butters, MD;  Location: [place];  Service: Orthopedics;  Laterality: Right;   TONSILLECTOMY        Social History:   reports that she has never smoked. She has never used smokeless tobacco. She reports current alcohol use. She reports that she does not use drugs.   Family History:  Her family history includes Cancer in her maternal grandmother; Diabetes in her father and mother; Heart disease in her father and mother; Hypertension in her mother; Stroke in her maternal grandmother.   Allergies Allergies  Allergen Reactions   Hydrazine Yellow [Tartrazine] Shortness Of Breath and Swelling    Swelling mostly noticed in legs and feet, retaining urination, shortness of breaht, and minor chest pain   Lisinopril Shortness Of Breath    Was on prinzide; had sob/chest pain on it.   Tylenol [Acetaminophen] Itching and Swelling    Itching of the mouth, swelling of tongue and stomach started hurting     Home Medications  Prior to Admission medications   Medication Sig Start Date End Date Taking? Authorizing Provider  Accu-Chek FastClix Lancets MISC USE 1 TO CHECK GLUCOSE 4 TIMES DAILY 01/26/20  Yes [name], [name], MD  albuterol (VENTOLIN HFA) 108 (90 Base) MCG/ACT inhaler INHALE 2 PUFFS BY MOUTH EVERY 6 HOURS AS NEEDED FOR WHEEZING FOR SHORTNESS OF BREATH Patient taking differently: Inhale 2 puffs into the lungs every 6 (six) hours as needed for wheezing or shortness of breath. 01/23/21  Yes Charlott Rakes, MD  amLODipine (NORVASC) 10 MG tablet Take 1 tablet (10 mg total) by mouth daily. 06/02/21  Yes [name], NP  amoxicillin (AMOXIL) 500 MG capsule Take 500 mg by mouth every 8 (eight) hours. 07/03/21  Yes [provider]  aspirin 81 MG tablet Take 1 tablet (81 mg total) by mouth daily. 01/13/18  Yes Clent Demark, PA-C  aspirin-acetaminophen-caffeine (EXCEDRIN MIGRAINE) [phone] MG tablet Take 2 tablets by mouth every 6 (six) hours as needed for headache.   Yes [provider]  atorvastatin (LIPITOR) 40 MG tablet Take 1 tablet (40 mg total) by mouth daily.  12/06/20  Yes [name], NP  baclofen (LIORESAL) 10 MG tablet Take 0.5-1 tablets (5-10 mg total) by mouth 3 (three) times daily as needed for muscle spasms. 04/18/21  Yes Hilts, Legrand Como, MD  BD PEN NEEDLE NANO 2ND GEN 32G X 4 MM MISC  09/30/20  Yes [provider]  BD  VEO INSULIN SYRINGE U/F 31G X 15/64" 1 ML MISC USE  SYRINGE ONCE DAILY 06/07/18  Yes Elayne Snare, MD  Blood Glucose Monitoring Suppl (ACCU-CHEK AVIVA PLUS) w/Device KIT 1 each by Does not apply route as directed. 04/15/18  Yes Elayne Snare, MD  celecoxib (CELEBREX) 100 MG capsule Take 1 capsule (100 mg total) by mouth 2 (two) times daily. 04/10/21  Yes [name], NP  diphenhydrAMINE (BENADRYL) 25 MG tablet Take 50 mg by mouth in the morning and at bedtime. Allergies   Yes [provider]  diphenhydrAMINE (SOMINEX) 25 MG tablet Take by mouth.   Yes [provider]  DULoxetine (CYMBALTA) 30 MG capsule Take 1 capsule (30 mg total) by mouth daily. 06/02/21  Yes [name], NP  fluticasone (FLOVENT HFA) 110 MCG/ACT inhaler Inhale 2 puffs into the lungs 2 (two) times daily. 12/05/19  Yes [name], NP  gabapentin (NEURONTIN) 300 MG capsule TAKE 2 CAPSULES BY MOUTH THREE TIMES DAILY 02/11/21  Yes Juluis Mire P, NP  glucose blood (ACCU-CHEK AVIVA PLUS) test strip Use as instructed to test blood sugar 4 times daily. 01/11/19  Yes [name], NP  HUMULIN R U-500 KWIKPEN 500 UNIT/ML kwikpen Inject 75-80 Units into the skin 3 (three) times daily with meals. AM 80 units  Noon 75 units  Evening 75 units If BG above 200 add 5 units to prescribed dose 09/30/20  Yes [provider]  hydrochlorothiazide (HYDRODIURIL) 25 MG tablet Take 1 tablet (25 mg total) by mouth every morning. 12/05/19  Yes [name], NP  ibuprofen (ADVIL,MOTRIN) 200 MG tablet Take 400 mg by mouth every 6 (six) hours as needed for fever or moderate pain.   Yes [provider]  Insulin  Pen Needle (B-D UF III MINI PEN NEEDLES) 31G X 5 MM MISC Use as instructed. Monitor blood glucose levels three times per day 04/13/19  Yes [name], NP  isosorbide mononitrate (IMDUR) 30 MG 24 hr tablet Take 0.5 tablets (15 mg total) by mouth daily. Please keep upcoming appt with Cardiologist in October 2022 before anymore refills. Thank you Final attempt 06/19/21  Yes [name], MD  liraglutide (VICTOZA) 18 MG/3ML SOPN Inject 1.8 mg into the skin at bedtime.  07/26/19  Yes [provider]  losartan (COZAAR) 100 MG tablet Take 1 tablet (100 mg total) by mouth daily. Please keep upcoming appt in October 2022 with Cardiologist before anymore refills. Thank you Final Attempt 06/19/21  Yes [name], MD  metoprolol tartrate (LOPRESSOR) 100 MG tablet Take 1 tablet (100 mg total) by mouth 2 (two) times daily. Please keep upcoming appt in October 2022 with Cardiologist before anymore refills. Thank you Final Attempt 06/19/21  Yes [name], MD  metroNIDAZOLE (FLAGYL) 500 MG tablet Take 1 tablet (500 mg total) by mouth 2 (two) times daily. 06/05/21  Yes Shelly Bombard, MD  Multiple Vitamins-Minerals (MULTIVITAMIN PO) Take 1 tablet by mouth daily.   Yes [provider]  nitroGLYCERIN (NITROSTAT) 0.4 MG SL tablet Place 1 tablet (0.4 mg total) under the tongue every 5 (five) minutes as needed for chest pain. Please keep upcoming appt in October 2022. Final Attempt 06/19/21  Yes [name], MD  empagliflozin (JARDIANCE) 10 MG TABS tablet Take 10 mg by mouth daily. Patient not taking: No sig reported 11/06/19   [name], NP  Melatonin 10 MG TABS Take 3 tablets by mouth at bedtime.  [provider]          Everlene Balls [facility] 07/11/2021, 2:03 PM  Please see [email or web address] for pager details.  From 7A-7P if no response, please call [phone]. After hours, please call ELink [phone].

## 2021-07-11 NOTE — H&P (Addendum)
History and Physical    Lindsay [name] OHY:[id number] DOB: [birth year] DOA: 07/10/2021  PCP: [name], NP   Patient coming from: Home.  I have personally briefly reviewed patient's old medical records in [place]  Chief Complaint: Shortness of breath.  HPI: Lindsay [name] is a 40 y.o. female with medical history significant of acute renal failure in 2016, asthma, RUE cellulitis, history of CAD, history of NSTEMI in 2017/status post angioplasty with stent, insulin requiring type II DM, hyperlipidemia, hypertension who is brought to the emergency department via EMS due to progressively worsening dyspnea since 07/09/2021 evening that did not respond to her home albuterol.  She also mentioned that she started having fever since yesterday evening.  She denies any travel history or sick contacts.  No chest pain, palpitations, diaphoresis, PND, but gets occasional lower extremity pitting edema.  Denies abdominal pain, nausea, emesis, diarrhea, constipation, melena or hematochezia.  No dysuria, frequency or hematuria.  No polyuria, polydipsia, polyphagia or blurred vision.  ED Course: Initial vital signs were temperature 1 a 1.9 F, pulse 110, respirations 16, BP 175/90 mmHg and O2 sat 100% on simple mask at 8 LPM.  The patient received ceftriaxone 2 g IVPB and azithromycin 500 mg IVPB.  I added 2000 mL of LR bolus.  Lab work: Urinalysis showed glucosuria more than 500 and proteinuria 100 mg/dL.  There was small hemoglobinuria on the chemical part of the test, but only 0-5 on microscopic examination.  CBC showed a white count of 10.9, hemoglobin 9.7 g/dL and platelets 206.  Her baseline hemoglobin from 12/2020 04/19/2020 was 13.0 to 14.1 g/dL.  PT/INR/PTT within normal limits.  Lactic acid was 2.9 and then 2.4 mmol/L.  Troponin was 12 and then 18 ng/L, but the second measurement was obtained only a little over an hour after the first 1.  BNP was 103.2 pg/mL.  BMP showed a glucose of 234  and calcium of 8.7 mg/dL.  Imaging: One-view portable chest radiograph showed development of diffuse interstitial infiltrate atypical infection versus edema.  Review of Systems: As per HPI otherwise all other systems reviewed and are negative.  Past Medical History:  Diagnosis Date   ARF (acute renal failure) ([place]) 04/2015   Asthma    Cellulitis of right upper extremity    Coronary artery disease    Diabetes mellitus    insulin dependent   Hyperlipidemia LDL goal <70    Hypertension    NSTEMI (non-ST elevated myocardial infarction) ([place]) 08/2016   Obesity    S/P angioplasty with stent 08/2016   DES to mLAD and PTCA only to 2nd diag ostium.    Past Surgical History:  Procedure Laterality Date   CARDIAC CATHETERIZATION N/A 09/07/2016   Procedure: Left Heart Cath and Coronary Angiography;  Surgeon: [name], MD;  Location: [facility];  Service: Cardiovascular;  Laterality: N/A;   CARDIAC CATHETERIZATION N/A 09/07/2016   Procedure: Coronary Stent Intervention;  Surgeon: [name], MD;  Location: [facility];  Service: Cardiovascular;  Laterality: N/A;   CARDIAC CATHETERIZATION N/A 09/07/2016   Procedure: Coronary Balloon Angioplasty;  Surgeon: [name], MD;  Location: [facility];  Service: Cardiovascular;  Laterality: N/A;   CESAREAN SECTION     IRRIGATION AND DEBRIDEMENT SHOULDER Right 04/30/2015   Procedure: IRRIGATION AND DEBRIDEMENT SHOULDER;  Surgeon: Renette Butters, MD;  Location: [facility];  Service: Orthopedics;  Laterality: Right;   IRRIGATION AND DEBRIDEMENT SHOULDER Right 05/01/2015  LEFT HEART CATH AND CORONARY ANGIOGRAPHY N/A 04/25/2020   Procedure: LEFT HEART CATH AND CORONARY ANGIOGRAPHY;  Surgeon: [name], MD;  Location: [facility];  Service: Cardiovascular;  Laterality: N/A;   LEG SURGERY     SHOULDER ARTHROSCOPY Right 04/30/2015   Procedure: ARTHROSCOPY SHOULDER;  Surgeon: Renette Butters, MD;  Location: [facility];  Service:  Orthopedics;  Laterality: Right;   TONSILLECTOMY     Social History  reports that she has never smoked. She has never used smokeless tobacco. She reports current alcohol use. She reports that she does not use drugs.  Allergies  Allergen Reactions   Hydrazine Yellow [Tartrazine] Shortness Of Breath and Swelling    Swelling mostly noticed in legs and feet, retaining urination, shortness of breaht, and minor chest pain   Lisinopril Shortness Of Breath    Was on prinzide; had sob/chest pain on it.   Tylenol [Acetaminophen] Itching and Swelling    Itching of the mouth, swelling of tongue and stomach started hurting   Family History  Problem Relation Age of Onset   Diabetes Mother    Hypertension Mother    Heart disease Mother    Diabetes Father    Heart disease Father    Stroke Maternal Grandmother    Cancer Maternal Grandmother    Prior to Admission medications   Medication Sig Start Date End Date Taking? Authorizing Provider  albuterol (VENTOLIN HFA) 108 (90 Base) MCG/ACT inhaler INHALE 2 PUFFS BY MOUTH EVERY 6 HOURS AS NEEDED FOR WHEEZING FOR SHORTNESS OF BREATH Patient taking differently: Inhale 2 puffs into the lungs every 6 (six) hours as needed for wheezing or shortness of breath. 01/23/21  Yes Charlott Rakes, MD  Accu-Chek FastClix Lancets MISC USE 1 TO CHECK GLUCOSE 4 TIMES DAILY 01/26/20   Charlott Rakes, MD  amLODipine (NORVASC) 10 MG tablet Take 1 tablet (10 mg total) by mouth daily. 06/02/21   [name], NP  aspirin 81 MG tablet Take 1 tablet (81 mg total) by mouth daily. 01/13/18   Clent Demark, PA-C  aspirin-acetaminophen-caffeine (EXCEDRIN MIGRAINE) [phone] MG tablet Take 2 tablets by mouth every 6 (six) hours as needed for headache.    [provider]  atorvastatin (LIPITOR) 40 MG tablet Take 1 tablet (40 mg total) by mouth daily. 12/06/20   [name], NP  atorvastatin (LIPITOR) 80 MG tablet Take 80 mg by mouth daily. Patient not  taking: Reported on 06/03/2021 10/14/20   [provider]  baclofen (LIORESAL) 10 MG tablet Take 0.5-1 tablets by mouth 3 (three) times daily as needed. 01/09/21   [provider]  baclofen (LIORESAL) 10 MG tablet Take 0.5-1 tablets (5-10 mg total) by mouth 3 (three) times daily as needed for muscle spasms. Patient not taking: Reported on 06/03/2021 04/18/21   Hilts, Legrand Como, MD  BD PEN NEEDLE NANO 2ND GEN 32G X 4 MM MISC  09/30/20   [provider]  BD VEO INSULIN SYRINGE U/F 31G X 15/64" 1 ML MISC USE  SYRINGE ONCE DAILY Patient not taking: Reported on 06/03/2021 06/07/18   Elayne Snare, MD  Blood Glucose Monitoring Suppl (ACCU-CHEK AVIVA PLUS) w/Device KIT 1 each by Does not apply route as directed. 04/15/18   Elayne Snare, MD  celecoxib (CELEBREX) 100 MG capsule Take 1 capsule (100 mg total) by mouth 2 (two) times daily. 04/10/21   [name], NP  cephALEXin (KEFLEX) 500 MG capsule Take 1 capsule (500 mg total) by mouth 3 (  three) times daily. Patient not taking: Reported on 06/03/2021 01/27/21   Marzetta Board, DPM  Continuous Blood Gluc Receiver ([place]) [place] See admin instructions. 06/17/20   [provider]  Continuous Blood Gluc Sensor (DEXCOM G6 SENSOR) MISC Inject into the skin. 06/29/20   [provider]  Continuous Blood Gluc Transmit (DEXCOM G6 TRANSMITTER) MISC Inject into the skin. 05/29/20   [provider]  diphenhydrAMINE (BENADRYL) 25 MG tablet Take 50 mg by mouth in the morning and at bedtime. Allergies    [provider]  diphenhydrAMINE (SOMINEX) 25 MG tablet Take by mouth.    [provider]  DULoxetine (CYMBALTA) 30 MG capsule Take 1 capsule (30 mg total) by mouth daily. 06/02/21   [name], NP  empagliflozin (JARDIANCE) 10 MG TABS tablet Take 10 mg by mouth daily. Patient not taking: Reported on 06/03/2021 11/06/19   [name], NP  fluticasone (FLOVENT HFA) 110 MCG/ACT  inhaler Inhale 2 puffs into the lungs 2 (two) times daily. 12/05/19   [name], NP  gabapentin (NEURONTIN) 300 MG capsule TAKE 2 CAPSULES BY MOUTH THREE TIMES DAILY 02/11/21   [name], NP  glucose blood (ACCU-CHEK AVIVA PLUS) test strip Use as instructed to test blood sugar 4 times daily. 01/11/19   [name], NP  HUMULIN R U-500 KWIKPEN 500 UNIT/ML kwikpen Inject into the skin. 09/30/20   [provider]  hydrochlorothiazide (HYDRODIURIL) 25 MG tablet Take 1 tablet (25 mg total) by mouth every morning. 12/05/19   [name], NP  ibuprofen (ADVIL,MOTRIN) 200 MG tablet Take 400 mg by mouth every 6 (six) hours as needed for fever or moderate pain.    [provider]  Insulin Pen Needle (B-D UF III MINI PEN NEEDLES) 31G X 5 MM MISC Use as instructed. Monitor blood glucose levels three times per day Patient not taking: Reported on 06/03/2021 04/13/19   [name], NP  insulin regular human CONCENTRATED (HUMULIN R) 500 UNIT/ML injection Inject 50 Units into the skin.    [provider]  isosorbide mononitrate (IMDUR) 30 MG 24 hr tablet Take 0.5 tablets (15 mg total) by mouth daily. Please keep upcoming appt with Cardiologist in October 2022 before anymore refills. Thank you Final attempt 06/19/21   [name], MD  liraglutide (VICTOZA) 18 MG/3ML SOPN Inject 1.8 mg into the skin at bedtime.  07/26/19   [provider]  losartan (COZAAR) 100 MG tablet Take 1 tablet (100 mg total) by mouth daily. Please keep upcoming appt in October 2022 with Cardiologist before anymore refills. Thank you Final Attempt 06/19/21   [name], MD  Melatonin 10 MG TABS Take 3 tablets by mouth at bedtime.    [provider]  metoprolol tartrate (LOPRESSOR) 100 MG tablet Take 1 tablet (100 mg total) by mouth 2 (two) times daily. Please keep upcoming appt in October 2022 with Cardiologist before anymore refills. Thank you Final Attempt  06/19/21   [name], MD  metroNIDAZOLE (FLAGYL) 500 MG tablet Take 1 tablet (500 mg total) by mouth 2 (two) times daily. 06/05/21   Shelly Bombard, MD  Multiple Vitamins-Minerals (MULTIVITAMIN PO) Take 1 tablet by mouth daily.    [provider]  nitroGLYCERIN (NITROSTAT) 0.4 MG SL tablet Place 1 tablet (0.4 mg total) under the tongue every 5 (five) minutes as needed for chest pain. Please keep upcoming appt in October 2022. Final Attempt 06/19/21  [name], MD  Ostomy Supplies (SKIN TAC ADHESIVE BARRIER WIPE) MISC Dispense and use as directed Patient not taking: Reported on 06/03/2021 05/22/20   [provider]    Physical Exam: Vitals:   07/11/21 0200 07/11/21 0230 07/11/21 0330 07/11/21 0342  BP: (!) 159/71 136/75 124/69   Pulse: (!) 104 (!) 102 93   Resp: (!) 29 (!) 27    Temp:    99.3 F (37.4 C)  TempSrc:      SpO2: 98% 99% 96%   Weight:      Height:        Constitutional: Looks acutely ill.  NAD, calm, comfortable Eyes: PERRL, lids and conjunctivae normal.  Sclerae is injected. ENMT: Nasal cannula in place.  Mucous membranes are moist. Posterior pharynx clear of any exudate or lesions. Neck: normal, supple, no masses, no thyromegaly Respiratory: Decreased breath sounds on bases with bilateral rhonchi and wheezing, mild scattered crackles. Normal respiratory effort. No accessory muscle use.  Cardiovascular: Regular rate and rhythm, no murmurs / rubs / gallops.  Stage II lymphedema.  No lower extremity pitting edema. 2+ pedal pulses. No carotid bruits.  Abdomen: Obese, no distention.  Bowel sounds positive.  Soft, no tenderness, no masses palpated. No hepatosplenomegaly.  Musculoskeletal: no clubbing / cyanosis. Good ROM, no contractures. Normal muscle tone.  Skin: no rashes, lesions, ulcers on very limited dermatological examination. Neurologic: CN 2-12 grossly intact. Sensation intact, DTR normal. Strength 5/5 in all 4.  Psychiatric: Normal judgment  and insight. Alert and oriented x 3. Normal mood.   Labs on Admission: I have personally reviewed following labs and imaging studies  CBC: Recent Labs  Lab 07/10/21 2300  WBC 10.9*  HGB 9.7*  HCT 32.0*  MCV 84.9  PLT 633   Basic Metabolic Panel: Recent Labs  Lab 07/10/21 2300  NA 135  K 4.1  CL 100  CO2 24  GLUCOSE 234*  BUN 7  CREATININE 0.88  CALCIUM 8.7*   GFR: Estimated Creatinine Clearance: 157.6 mL/min (by C-G formula based on SCr of 0.88 mg/dL).  Liver Function Tests: No results for input(s): AST, ALT, ALKPHOS, BILITOT, PROT, ALBUMIN in the last 168 hours.  Urine analysis:    Component Value Date/Time   COLORURINE YELLOW 07/11/2021 0128   APPEARANCEUR CLEAR 07/11/2021 0128   LABSPEC 1.011 07/11/2021 0128   PHURINE 6.0 07/11/2021 0128   GLUCOSEU >=500 (A) 07/11/2021 0128        HGBUR SMALL (A) 07/11/2021 0128   BILIRUBINUR NEGATIVE 07/11/2021 0128        KETONESUR NEGATIVE 07/11/2021 0128   PROTEINUR 100 (A) 07/11/2021 0128        NITRITE NEGATIVE 07/11/2021 0128   LEUKOCYTESUR NEGATIVE 07/11/2021 0128   Radiological Exams on Admission: DG Chest Port 1 View  Result Date: 07/10/2021 CLINICAL DATA:  Dyspnea, fever EXAM: PORTABLE CHEST 1 VIEW COMPARISON:  04/20/2018 FINDINGS: Pulmonary insufflation is normal and symmetric. There has developed diffuse interstitial infiltrate within the lungs, more prevalent within the mid and lower lung zones, atypical infection versus edema. No pneumothorax or pleural effusion. Cardiac size is within normal limits. No acute bone abnormality., IMPRESSION: Interval development of diffuse interstitial infiltrate, atypical infection versus edema. Electronically Signed   By: Fidela Salisbury MD   On: 07/10/2021 22:14    04/2020 LEFT HEART CATH AND CORONARY ANGIOGRAPHY    Conclusion  LAD stent is widely patent with perhaps eccentric 20% narrowing in some views.  The jailed second diagonal is widely  patent.  Proximal first  diagonal contains ostial to proximal segmental 60% narrowing Normal left main Moderate irregularities in the circumflex up to 40% in the first marginal. Dominant right coronary, luminal irregularities. Normal LV function with EF 60%.  Normal LVEDP.   RECOMMENDATIONS:   Continue aggressive risk factor modification including greater than 150 minutes of moderate activity 5 out of 7 days of the week, weight loss, LDL less than 70, blood pressure control, and management of potential sleep apnea. Chest pain and dyspnea do not appear to be related to obstructive disease or heart failure respectively.  Other explanations should be sought.  EKG: Independently reviewed.  Vent. rate 105 BPM PR interval 147 ms QRS duration 88 ms QT/QTcB 346/458 ms P-R-T axes 34 48 46 Sinus tachycardia Low voltage, precordial leads Borderline T wave abnormalities  Assessment/Plan Principal Problem:   CAP (community acquired pneumonia) Observation/telemetry. Continue supplemental oxygen. Bronchodilators as needed. Continue ceftriaxone 2 g IVPB every 24 hours. Continue azithromycin 500 mg IVPB every 24 hours. Check sputum gram stain, culture and sensitivity. Follow blood cultures and sensitivity. Check and trend procalcitonin.  Active Problems:   Essential hypertension Continue amlodipine 10 mg p.o. daily. Continue metoprolol 100 mg p.o. twice daily. Hold HCTZ and ACE inhibitor for 24 to 48 hours.    DM neuropathy, type II diabetes mellitus (HCC) Continue duloxetine 30 mg p.o. daily. Continue gabapentin 300 mg p.o. 3 times daily.    Coronary artery disease involving native coronary artery of native heart with angina pectoris ([place]) Asymptomatic at this time. Continue metoprolol, isosorbide, aspirin and statin.    OSA (obstructive sleep apnea) Continue CPAP at bedtime.    Hyperlipidemia Continue atorvastatin 40 mg p.o. daily.    Normocytic anemia Monitor hematocrit and hemoglobin. Check anemia  panel.    DVT prophylaxis: Lovenox SQ. Code Status:   Full code. Family Communication:   Disposition Plan:   Patient is from:  Home.  Anticipated DC to:  Home.  Anticipated DC date:  07/13/2021  Anticipated DC barriers: Clinical status.  Consults called:   Admission status:  Observation/telemetry.  Severity of Illness:  High severity due to presenting with dyspnea in the setting of acute respiratory failure with hypoxia secondary to CAP.  The patient will need to remain for IV antibiotic therapy for at least 48 to 72 hours.  Reubin Milan MD [facility]  How to contact the [facility] Attending or Consulting provider [place] or covering provider during after hours [place], for this patient?   Check the care team in [facility] and look for a) attending/consulting [facility] provider listed and b) the [facility] team listed Log into [email or web address] and use [place]'s universal password to access. If you do not have the password, please contact the hospital operator. Locate the [facility] provider you are looking for under [facility] and page to a number that you can be directly reached. If you still have difficulty reaching the provider, please page the [facility] (Director on Call) for the Hospitalists listed on amion for assistance.  07/11/2021, 3:52 AM   This document was prepared using Dragon voice recognition software and may contain some unintended transcription errors.

## 2021-07-11 NOTE — Progress Notes (Signed)
Bilateral lower extremity venous duplex completed. Refer to "CV Proc" under chart review to view preliminary results.  07/11/2021 3:09 PM Eula Fried., MHA, RVT, RDCS, RDMS

## 2021-07-11 NOTE — ED Notes (Signed)
Respiratory at bedside to place pt on BiPAP.

## 2021-07-12 ENCOUNTER — Observation Stay (HOSPITAL_COMMUNITY): Payer: Medicaid Other

## 2021-07-12 DIAGNOSIS — E662 Morbid (severe) obesity with alveolar hypoventilation: Secondary | ICD-10-CM | POA: Diagnosis not present

## 2021-07-12 DIAGNOSIS — I10 Essential (primary) hypertension: Secondary | ICD-10-CM | POA: Diagnosis not present

## 2021-07-12 DIAGNOSIS — J811 Chronic pulmonary edema: Secondary | ICD-10-CM | POA: Diagnosis not present

## 2021-07-12 DIAGNOSIS — E871 Hypo-osmolality and hyponatremia: Secondary | ICD-10-CM | POA: Diagnosis not present

## 2021-07-12 DIAGNOSIS — Z794 Long term (current) use of insulin: Secondary | ICD-10-CM | POA: Diagnosis not present

## 2021-07-12 DIAGNOSIS — I5031 Acute diastolic (congestive) heart failure: Secondary | ICD-10-CM

## 2021-07-12 DIAGNOSIS — D638 Anemia in other chronic diseases classified elsewhere: Secondary | ICD-10-CM | POA: Diagnosis not present

## 2021-07-12 DIAGNOSIS — E1140 Type 2 diabetes mellitus with diabetic neuropathy, unspecified: Secondary | ICD-10-CM | POA: Diagnosis not present

## 2021-07-12 DIAGNOSIS — I25119 Atherosclerotic heart disease of native coronary artery with unspecified angina pectoris: Secondary | ICD-10-CM

## 2021-07-12 DIAGNOSIS — J9690 Respiratory failure, unspecified, unspecified whether with hypoxia or hypercapnia: Secondary | ICD-10-CM | POA: Diagnosis not present

## 2021-07-12 DIAGNOSIS — J9601 Acute respiratory failure with hypoxia: Secondary | ICD-10-CM | POA: Diagnosis not present

## 2021-07-12 DIAGNOSIS — J189 Pneumonia, unspecified organism: Secondary | ICD-10-CM | POA: Diagnosis not present

## 2021-07-12 DIAGNOSIS — Z6841 Body Mass Index (BMI) 40.0 and over, adult: Secondary | ICD-10-CM | POA: Diagnosis not present

## 2021-07-12 DIAGNOSIS — J45909 Unspecified asthma, uncomplicated: Secondary | ICD-10-CM | POA: Diagnosis not present

## 2021-07-12 DIAGNOSIS — Z20822 Contact with and (suspected) exposure to covid-19: Secondary | ICD-10-CM | POA: Diagnosis not present

## 2021-07-12 DIAGNOSIS — E1121 Type 2 diabetes mellitus with diabetic nephropathy: Secondary | ICD-10-CM | POA: Diagnosis not present

## 2021-07-12 DIAGNOSIS — I5043 Acute on chronic combined systolic (congestive) and diastolic (congestive) heart failure: Secondary | ICD-10-CM | POA: Diagnosis not present

## 2021-07-12 DIAGNOSIS — N179 Acute kidney failure, unspecified: Secondary | ICD-10-CM | POA: Diagnosis not present

## 2021-07-12 DIAGNOSIS — I110 Hypertensive heart disease with heart failure: Secondary | ICD-10-CM | POA: Diagnosis not present

## 2021-07-12 LAB — CBC WITH DIFFERENTIAL/PLATELET
Abs Immature Granulocytes: 0.05 10*3/uL (ref 0.00–0.07)
Basophils Absolute: 0 10*3/uL (ref 0.0–0.1)
Basophils Relative: 0 %
Eosinophils Absolute: 0.2 10*3/uL (ref 0.0–0.5)
Eosinophils Relative: 2 %
HCT: 34.3 % — ABNORMAL LOW (ref 36.0–46.0)
Hemoglobin: 10.7 g/dL — ABNORMAL LOW (ref 12.0–15.0)
Immature Granulocytes: 1 %
Lymphocytes Relative: 19 %
Lymphs Abs: 1.9 10*3/uL (ref 0.7–4.0)
MCH: 26.4 pg (ref 26.0–34.0)
MCHC: 31.2 g/dL (ref 30.0–36.0)
MCV: 84.5 fL (ref 80.0–100.0)
Monocytes Absolute: 1.2 10*3/uL — ABNORMAL HIGH (ref 0.1–1.0)
Monocytes Relative: 12 %
Neutro Abs: 6.7 10*3/uL (ref 1.7–7.7)
Neutrophils Relative %: 66 %
Platelets: 213 10*3/uL (ref 150–400)
RBC: 4.06 MIL/uL (ref 3.87–5.11)
RDW: 14.6 % (ref 11.5–15.5)
WBC: 10 10*3/uL (ref 4.0–10.5)
nRBC: 0 % (ref 0.0–0.2)

## 2021-07-12 LAB — C-REACTIVE PROTEIN: CRP: 29.7 mg/dL — ABNORMAL HIGH (ref <1.0)

## 2021-07-12 LAB — COMPREHENSIVE METABOLIC PANEL
ALT: 28 U/L (ref 0–44)
AST: 30 U/L (ref 15–41)
Albumin: 3 g/dL — ABNORMAL LOW (ref 3.5–5.0)
Alkaline Phosphatase: 98 U/L (ref 38–126)
Anion gap: 11 (ref 5–15)
BUN: 11 mg/dL (ref 6–20)
CO2: 27 mmol/L (ref 22–32)
Calcium: 9.2 mg/dL (ref 8.9–10.3)
Chloride: 96 mmol/L — ABNORMAL LOW (ref 98–111)
Creatinine, Ser: 1.07 mg/dL — ABNORMAL HIGH (ref 0.44–1.00)
GFR, Estimated: >60 mL/min (ref >60)
Glucose, Bld: 217 mg/dL — ABNORMAL HIGH (ref 70–99)
Potassium: 4.3 mmol/L (ref 3.5–5.1)
Sodium: 134 mmol/L — ABNORMAL LOW (ref 135–145)
Total Bilirubin: 1.5 mg/dL — ABNORMAL HIGH (ref 0.3–1.2)
Total Protein: 7.5 g/dL (ref 6.5–8.1)

## 2021-07-12 LAB — GLUCOSE, CAPILLARY
Glucose-Capillary: 236 mg/dL — ABNORMAL HIGH (ref 70–99)
Glucose-Capillary: 244 mg/dL — ABNORMAL HIGH (ref 70–99)
Glucose-Capillary: 205 mg/dL — ABNORMAL HIGH (ref 70–99)
Glucose-Capillary: 117 mg/dL — ABNORMAL HIGH (ref 70–99)

## 2021-07-12 LAB — ECHOCARDIOGRAM COMPLETE
Area-P 1/2: 3.72 cm2
Height: 67 in
S' Lateral: 2.6 cm
Weight: 7000 oz

## 2021-07-12 LAB — D-DIMER, QUANTITATIVE: D-Dimer, Quant: 2.78 ug/mL-FEU — ABNORMAL HIGH (ref 0.00–0.50)

## 2021-07-12 LAB — URINE CULTURE: Culture: <10000 — AB

## 2021-07-12 LAB — BRAIN NATRIURETIC PEPTIDE: B Natriuretic Peptide: 59.1 pg/mL (ref 0.0–100.0)

## 2021-07-12 LAB — TROPONIN I (HIGH SENSITIVITY): Troponin I (High Sensitivity): 9 ng/L (ref <18)

## 2021-07-12 LAB — MAGNESIUM: Magnesium: 1.7 mg/dL (ref 1.7–2.4)

## 2021-07-12 LAB — PROCALCITONIN: Procalcitonin: 0.2 ng/mL

## 2021-07-12 MED ORDER — ISOSORBIDE MONONITRATE ER 60 MG PO TB24
60.0000 mg | ORAL_TABLET | Freq: Every day | ORAL | Status: DC
  Administered 2021-07-12 – 2021-07-31 (×20): 60 mg via ORAL
  Filled 2021-07-12 (×21): qty 1

## 2021-07-12 MED ORDER — GABAPENTIN 300 MG PO CAPS
600.0000 mg | ORAL_CAPSULE | Freq: Three times a day (TID) | ORAL | Status: DC
  Administered 2021-07-12 – 2021-07-31 (×57): 600 mg via ORAL
  Filled 2021-07-12 (×7): qty 2
  Filled 2021-07-12: qty 6
  Filled 2021-07-12 (×49): qty 2

## 2021-07-12 MED ORDER — MAGNESIUM SULFATE 2 GM/50ML IV SOLN
2.0000 g | Freq: Once | INTRAVENOUS | Status: AC
  Administered 2021-07-12: 2 g via INTRAVENOUS
  Filled 2021-07-12: qty 50

## 2021-07-12 MED ORDER — HYDRALAZINE HCL 50 MG PO TABS
100.0000 mg | ORAL_TABLET | Freq: Three times a day (TID) | ORAL | Status: DC
  Administered 2021-07-12 – 2021-07-31 (×58): 100 mg via ORAL
  Filled 2021-07-12 (×59): qty 2

## 2021-07-12 MED ORDER — PERFLUTREN LIPID MICROSPHERE
1.0000 mL | INTRAVENOUS | Status: AC | PRN
  Administered 2021-07-12: 3.5 mL via INTRAVENOUS
  Filled 2021-07-12: qty 10

## 2021-07-12 MED ORDER — CHLORHEXIDINE GLUCONATE CLOTH 2 % EX PADS: 6.0000 | MEDICATED_PAD | Freq: Every day | CUTANEOUS | Status: DC

## 2021-07-12 MED ORDER — IBUPROFEN 200 MG PO TABS
400.0000 mg | ORAL_TABLET | Freq: Once | ORAL | Status: AC
  Administered 2021-07-12: 400 mg via ORAL
  Filled 2021-07-12: qty 2

## 2021-07-12 MED ORDER — FUROSEMIDE 40 MG PO TABS: 60.0000 mg | ORAL_TABLET | Freq: Every day | ORAL | Status: DC

## 2021-07-12 MED ORDER — MUPIROCIN 2 % EX OINT
1.0000 | TOPICAL_OINTMENT | Freq: Two times a day (BID) | CUTANEOUS | Status: AC
Start: 2021-07-12 — End: 2021-07-16
  Administered 2021-07-12 – 2021-07-16 (×10): 1 via NASAL
  Filled 2021-07-12 (×3): qty 22

## 2021-07-12 MED ORDER — DULOXETINE HCL 30 MG PO CPEP
30.0000 mg | ORAL_CAPSULE | Freq: Every morning | ORAL | Status: DC
  Administered 2021-07-13 – 2021-07-31 (×19): 30 mg via ORAL
  Filled 2021-07-12 (×19): qty 1

## 2021-07-12 MED ORDER — IPRATROPIUM-ALBUTEROL 0.5-2.5 (3) MG/3ML IN SOLN
3.0000 mL | Freq: Three times a day (TID) | RESPIRATORY_TRACT | Status: DC
  Administered 2021-07-12 – 2021-07-14 (×7): 3 mL via RESPIRATORY_TRACT
  Filled 2021-07-12 (×7): qty 3

## 2021-07-12 MED ORDER — CHLORHEXIDINE GLUCONATE CLOTH 2 % EX PADS
6.0000 | MEDICATED_PAD | Freq: Every day | CUTANEOUS | Status: DC
  Administered 2021-07-12 – 2021-07-30 (×18): 6 via TOPICAL

## 2021-07-12 MED ORDER — INSULIN ASPART 100 UNIT/ML IJ SOLN
35.0000 [IU] | Freq: Three times a day (TID) | INTRAMUSCULAR | Status: DC
  Administered 2021-07-12 – 2021-07-14 (×6): 35 [IU] via SUBCUTANEOUS

## 2021-07-12 MED ORDER — FUROSEMIDE 10 MG/ML IJ SOLN
40.0000 mg | Freq: Once | INTRAMUSCULAR | Status: AC
  Administered 2021-07-12: 40 mg via INTRAVENOUS
  Filled 2021-07-12: qty 4

## 2021-07-12 NOTE — Progress Notes (Signed)
At 1640 the patient began complaining of non-radiating central CP rated 6/10 om severity and described as sharp pressure. C/O mild SOB. Pt reports having similar episodes of this CP at home for which she typically takes 1-2 nitros with complete resolution of symptoms. 3 nitros were given during event with complete resolution of symptoms. An ekg was obtained and was noted to be unremarkable by critical MD. A troponin was ordered and is pending. VSS.

## 2021-07-12 NOTE — Progress Notes (Signed)
PROGRESS NOTE                                                                                                                                                                                                             Patient Demographics:    Lindsay [name], is a 40 y.o. female, DOB - 27-May-1981, CBJ:[id number]  Outpatient Primary MD for the patient is Grayce Sessions, NP    LOS - 1  Admit date - 07/10/2021    Chief Complaint  Patient presents with   Shortness of Breath       Brief Narrative (HPI from H&P) -  Lindsay [name] is a 40 y.o. female with medical history significant of acute renal failure in 2016, asthma, RUE cellulitis, history of CAD, history of NSTEMI in 2017/status post angioplasty with stent, insulin requiring type II DM, hyperlipidemia, hypertension who is brought to the emergency department via EMS due to progressively worsening dyspnea since 07/09/2021 evening that did not respond to her home albuterol, along with fever, was diagnosed with CAP and admitted.   Subjective:   Patient in bed, appears comfortable, denies any headache, no fever, no chest pain or pressure, improved shortness of breath , no abdominal pain. No new focal weakness.    Assessment  & Plan :     Acute Hypoxic Resp. Failure due to CAP + acute on chronic diastolic CHF last EF 60% present echo pending - she has been placed on appropriate antibiotics, along with IV Lasix for diuresis and oxygen, nebulizer treatments with supportive care.  She briefly required noninvasive positive pressure ventilation throughout 07/11/2021 due to extreme hypoxia, she was seen by critical care as well.  After diuresis and antibiotics on 07/12/2021 she feels a whole lot better.  Currently on 6 L nasal cannula oxygen with BiPAP schedule at nighttime for OSA.  Continue to monitor closely with uretic's and antibiotics     SpO2: 97 % O2 Flow Rate (L/min): 6  L/min FiO2 (%): 40 %  Recent Labs  Lab 07/10/21 2149 07/10/21 2153 07/10/21 2155 07/10/21 2254 07/10/21 2300 07/10/21 2353 07/11/21 0515 07/11/21 0614 07/11/21 1656 07/12/21 0520  WBC  --   --   --   --  10.9*  --   --   --   --  10.0  HGB  --   --   --   --  9.7*  --   --   --   --  10.7*  HCT  --   --   --   --  32.0*  --   --   --   --  34.3*  PLT  --   --   --   --  206  --   --   --   --  213  CRP  --   --   --   --   --   --   --   --  26.0* 29.7*  BNP  --   --  103.2*  --   --   --   --   --   --  59.1  DDIMER  --   --   --   --   --   --   --   --  0.88* 2.78*  PROCALCITON  --   --   --   --   --   --   --  <0.10  --  0.20  AST  --   --   --   --   --   --   --   --   --  30  ALT  --   --   --   --   --   --   --   --   --  28  ALKPHOS  --   --   --   --   --   --   --   --   --  98  BILITOT  --   --   --   --   --   --   --   --   --  1.5*  ALBUMIN  --   --   --   --   --   --   --   --   --  3.0*  INR 1.1  --   --   --   --   --   --   --   --   --   LATICACIDVEN  --  2.9*  --   --   --  2.4* 1.5  --   --   --   SARSCOV2NAA  --   --   --  NEGATIVE  --   --   --   --   --   --        2.  Acute on chronic diastolic CHF with EF 60% on last echo.  Improved with IV Lasix.  Monitor  3.  CAD s/p stenting in 2021.  Chest pain-free.  KG nonacute, initial troponin mildly elevated due to demand mismatch, serial troponin stable, continue aspirin, beta-blocker, statin for secondary prevention.  4.  Dyslipidemia.  Placed on statin.  5.  Hypertension.  Blood pressure has improved titrating regimen for better control.  6.  Morbid obesity with a BMI of 68 along with underlying OSA and OHS.  Daytime oxygen supplementation as above, nighttime CPAP scheduled.  7.  Mild AKI.  Hold ARB and NSAIDs.  Currently requires diuresis.  We will continue to monitor cautiously   8.  Mildly elevated D-dimer due to inflammation from pneumonia.  On weight-based prophylactic Lovenox dose, leg  ultrasound negative on 07/11/2021 Will monitor.  Note D-dimer upon admission was less than 1.  9. DM type II.  Continue home dose Jardiance long with 3 times daily scheduled NovoLog and sliding scale, will  monitor closely.    Lab Results  Component Value Date   HGBA1C 8.4 (H) 04/12/2019   CBG (last 3)  Recent Labs    07/11/21 1808 07/11/21 2004 07/12/21 0807  GLUCAP 239* 275* 236*        Condition - Extremely Guarded  Family Communication  :  daughter bedside on 07/11/2021  Code Status :  Full  Consults  : PCCM  PUD Prophylaxis : None   Procedures  :     Bilateral lower extremity venous duplex.  No DVT.  TTE      Disposition Plan  :    Status is: Inpatient  Dispo: The patient is from: Home              Anticipated d/c is to: Home              Patient currently is not medically stable to d/c.   Difficult to place patient No  DVT Prophylaxis  :  Lovenox    Lab Results  Component Value Date   PLT 213 07/12/2021    Diet :  Diet Order             Diet heart healthy/carb modified Room service appropriate? Yes; Fluid consistency: Thin  Diet effective now                    Inpatient Medications  Scheduled Meds:  aspirin  81 mg Oral Daily   atorvastatin  40 mg Oral Daily   budesonide (PULMICORT) nebulizer solution  0.25 mg Nebulization BID   Chlorhexidine Gluconate Cloth  6 each Topical Daily   empagliflozin  10 mg Oral Daily   enoxaparin (LOVENOX) injection  100 mg Subcutaneous Q24H   furosemide  40 mg Intravenous Once   hydrALAZINE  100 mg Oral Q8H   insulin aspart  0-5 Units Subcutaneous QHS   insulin aspart  0-9 Units Subcutaneous TID WC   insulin aspart  35 Units Subcutaneous TID WC   ipratropium-albuterol  3 mL Nebulization TID   isosorbide mononitrate  60 mg Oral Daily   metoprolol tartrate  100 mg Oral BID   mupirocin ointment  1 application Nasal BID   Continuous Infusions:  azithromycin Stopped (07/11/21 2301)   cefTRIAXone  (ROCEPHIN)  IV Stopped (07/11/21 2152)   PRN Meds:.albuterol, aspirin-acetaminophen-caffeine, baclofen, hydrALAZINE, nitroGLYCERIN, [DISCONTINUED] ondansetron **OR** ondansetron (ZOFRAN) IV  Antibiotics  :    Anti-infectives (From admission, onward)    Start     Dose/Rate Route Frequency Ordered Stop   07/10/21 2200  cefTRIAXone (ROCEPHIN) 2 g in sodium chloride 0.9 % 100 mL IVPB        2 g 200 mL/hr over 30 Minutes Intravenous Every 24 hours 07/10/21 2153     07/10/21 2200  azithromycin (ZITHROMAX) 500 mg in sodium chloride 0.9 % 250 mL IVPB        500 mg 250 mL/hr over 60 Minutes Intravenous Every 24 hours 07/10/21 2153          Time Spent in minutes  30   Susa Raring M.D on 07/12/2021 at 9:16 AM  To page go to [email or web address]   [facility] -  Office  [phone]    See all Orders from today for further details    Objective:   Vitals:   07/12/21 0630 07/12/21 0749 07/12/21 0800 07/12/21 0840  BP: 127/67  132/66   Pulse: 89 95 92   Resp: (!) 36 (!) 28 (!) 27  Temp:    98.8 F (37.1 C)  TempSrc:    Oral  SpO2: 94% 95% 97%   Weight:      Height:        Wt Readings from Last 3 Encounters:  07/10/21 (!) 198.4 kg  06/03/21 (!) 188.6 kg  12/05/20 (!) 186.7 kg     Intake/Output Summary (Last 24 hours) at 07/12/2021 0916 Last data filed at 07/12/2021 0800 Gross per 24 hour  Intake 368.2 ml  Output 4400 ml  Net -4031.8 ml     Physical Exam  Awake Alert, No new F.N deficits, Normal affect [place].AT,PERRAL Supple Neck,No JVD, No cervical lymphadenopathy appriciated.  Symmetrical Chest wall movement, Good air movement bilaterally, few rales RRR,No Gallops, Rubs or new Murmurs, No Parasternal Heave +ve B.Sounds, Abd Soft, No tenderness, No organomegaly appriciated, No rebound - guarding or rigidity. No Cyanosis, Clubbing , 1+ edema     RN pressure injury documentation: Pressure Ulcer 05/17/15 Stage II -  Partial thickness loss of dermis presenting as  a shallow open ulcer with a red, pink wound bed without slough. (Active)  05/17/15 0958  Location: Buttocks  Location Orientation: Right  Staging: Stage II -  Partial thickness loss of dermis presenting as a shallow open ulcer with a red, pink wound bed without slough.  Wound Description (Comments):   Present on Admission: Yes (patient stated present before admission to hospital)     Data Review:    CBC Recent Labs  Lab 07/10/21 2300 07/12/21 0520  WBC 10.9* 10.0  HGB 9.7* 10.7*  HCT 32.0* 34.3*  PLT 206 213  MCV 84.9 84.5  MCH 25.7* 26.4  MCHC 30.3 31.2  RDW 14.5 14.6  LYMPHSABS  --  1.9  MONOABS  --  1.2*  EOSABS  --  0.2  BASOSABS  --  0.0    Recent Labs  Lab 07/10/21 2149 07/10/21 2153 07/10/21 2155 07/10/21 2300 07/10/21 2353 07/11/21 0515 07/11/21 0614 07/11/21 1656 07/12/21 0520  NA  --   --   --  135  --   --   --   --  134*  K  --   --   --  4.1  --   --   --   --  4.3  CL  --   --   --  100  --   --   --   --  96*  CO2  --   --   --  24  --   --   --   --  27  GLUCOSE  --   --   --  234*  --   --   --   --  217*  BUN  --   --   --  7  --   --   --   --  11  CREATININE  --   --   --  0.88  --   --   --   --  1.07*  CALCIUM  --   --   --  8.7*  --   --   --   --  9.2  AST  --   --   --   --   --   --   --   --  30  ALT  --   --   --   --   --   --   --   --  28  ALKPHOS  --   --   --   --   --   --   --   --  98  BILITOT  --   --   --   --   --   --   --   --  1.5*  ALBUMIN  --   --   --   --   --   --   --   --  3.0*  MG  --   --   --   --   --   --   --   --  1.7  CRP  --   --   --   --   --   --   --  26.0* 29.7*  DDIMER  --   --   --   --   --   --   --  0.88* 2.78*  PROCALCITON  --   --   --   --   --   --  <0.10  --  0.20  LATICACIDVEN  --  2.9*  --   --  2.4* 1.5  --   --   --   INR 1.1  --   --   --   --   --   --   --   --   BNP  --   --  103.2*  --   --   --   --   --  59.1     ------------------------------------------------------------------------------------------------------------------ No results for input(s): CHOL, HDL, LDLCALC, TRIG, CHOLHDL, LDLDIRECT in the last 72 hours.  Lab Results  Component Value Date   HGBA1C 8.4 (H) 04/12/2019   ------------------------------------------------------------------------------------------------------------------ No results for input(s): TSH, T4TOTAL, T3FREE, THYROIDAB in the last 72 hours.  Invalid input(s): FREET3  Cardiac Enzymes No results for input(s): CKMB, TROPONINI, MYOGLOBIN in the last 168 hours.  Invalid input(s): CK ------------------------------------------------------------------------------------------------------------------    Component Value Date/Time   BNP 59.1 07/12/2021 0520    Micro Results Recent Results (from the past 240 hour(s))  Resp Panel by RT-PCR (Flu A&B, Covid) Nasopharyngeal Swab     Status: None   Collection Time: 07/10/21 10:54 PM   Specimen: Nasopharyngeal Swab; Nasopharyngeal(NP) swabs in vial transport medium  Result Value Ref Range Status   SARS Coronavirus 2 by RT PCR NEGATIVE NEGATIVE Final    Comment: (NOTE) SARS-CoV-2 target nucleic acids are NOT DETECTED.  The SARS-CoV-2 RNA is generally detectable in upper respiratory specimens during the acute phase of infection. The lowest concentration of SARS-CoV-2 viral copies this assay can detect is 138 copies/mL. A negative result does not preclude SARS-Cov-2 infection and should not be used as the sole basis for treatment or other patient management decisions. A negative result may occur with  improper specimen collection/handling, submission of specimen other than nasopharyngeal swab, presence of viral mutation(s) within the areas targeted by this assay, and inadequate number of viral copies(<138 copies/mL). A negative result must be combined with clinical observations, patient history, and  epidemiological information. The expected result is Negative.  Fact Sheet for Patients:  BloggerCourse.com  Fact Sheet for Healthcare Providers:  SeriousBroker.it  This test is no t yet approved or cleared by the [place] FDA and  has been authorized for detection and/or diagnosis of SARS-CoV-2 by FDA under an Emergency Use Authorization (EUA). This EUA will remain  in effect (meaning this test can be used) for the duration of the COVID-19 declaration under Section 564(b)(1) of the Act, 21 U.S.C.section 360bbb-3(b)(1), unless the authorization is terminated  or revoked sooner.       Influenza A by PCR NEGATIVE  NEGATIVE Final   Influenza B by PCR NEGATIVE NEGATIVE Final    Comment: (NOTE) The Xpert Xpress SARS-CoV-2/FLU/RSV plus assay is intended as an aid in the diagnosis of influenza from Nasopharyngeal swab specimens and should not be used as a sole basis for treatment. Nasal washings and aspirates are unacceptable for Xpert Xpress SARS-CoV-2/FLU/RSV testing.  Fact Sheet for Patients: BloggerCourse.com  Fact Sheet for Healthcare Providers: SeriousBroker.it  This test is not yet approved or cleared by the [place] FDA and has been authorized for detection and/or diagnosis of SARS-CoV-2 by FDA under an Emergency Use Authorization (EUA). This EUA will remain in effect (meaning this test can be used) for the duration of the COVID-19 declaration under Section 564(b)(1) of the Act, 21 U.S.C. section 360bbb-3(b)(1), unless the authorization is terminated or revoked.  Performed at [facility], [address] [place], [place] [address]   Blood Culture (routine x 2)     Status: None (Preliminary result)   Collection Time: 07/10/21 11:04 PM   Specimen: BLOOD  Result Value Ref Range Status   Specimen Description BLOOD RIGHT ANTECUBITAL  Final   Special Requests    Final    BOTTLES DRAWN AEROBIC AND ANAEROBIC Blood Culture results may not be optimal due to an excessive volume of blood received in culture bottles   Culture   Final    NO GROWTH 1 DAY Performed at [facility], [address] [place], [place] [address]    Report Status PENDING  Incomplete  MRSA Next Gen by PCR, Nasal     Status: Abnormal   Collection Time: 07/11/21  6:01 PM   Specimen: Nasal Mucosa; Nasal Swab  Result Value Ref Range Status   MRSA by PCR Next Gen DETECTED (A) NOT DETECTED Final    Comment: RESULT CALLED TO, READ BACK BY AND VERIFIED WITH: E [facility] RN 2108 07/11/21 A BROWNING (NOTE) The GeneXpert MRSA Assay (FDA approved for NASAL specimens only), is one component of a comprehensive MRSA colonization surveillance program. It is not intended to diagnose MRSA infection nor to guide or monitor treatment for MRSA infections. Test performance is not FDA approved in patients less than 77 years old. Performed at [facility], [address], [place], [place] [address]     Radiology Reports DG Chest [place] 1 View  Result Date: 07/12/2021 CLINICAL DATA:  Respiratory failure. EXAM: PORTABLE CHEST 1 VIEW COMPARISON:  07/11/2021 FINDINGS: The cardio pericardial silhouette is enlarged. There is pulmonary vascular congestion without overt pulmonary edema. Interstitial pulmonary edema pattern noted. No substantial pleural effusion. Telemetry leads overlie the chest. IMPRESSION: Enlarged cardiopericardial silhouette with pulmonary vascular congestion and interstitial pulmonary edema pattern. Stable to mildly improved in the interval. Electronically Signed   By: Kennith Center M.D.   On: 07/12/2021 08:24   DG Chest Port 1 View  Result Date: 07/11/2021 CLINICAL DATA:  Short of breath. EXAM: PORTABLE CHEST 1 VIEW COMPARISON:  07/10/2021 FINDINGS: Cardiac enlargement. Progression of diffuse bilateral airspace disease with vascular congestion. Findings compatible with congestive  heart failure and edema. No significant pleural effusion. IMPRESSION: Mild progression of pulmonary edema. Electronically Signed   By: Marlan Palau M.D.   On: 07/11/2021 14:04   DG Chest Port 1 View  Result Date: 07/10/2021 CLINICAL DATA:  Dyspnea, fever EXAM: PORTABLE CHEST 1 VIEW COMPARISON:  04/20/2018 FINDINGS: Pulmonary insufflation is normal and symmetric. There has developed diffuse interstitial infiltrate within the lungs, more prevalent within the mid and  lower lung zones, atypical infection versus edema. No pneumothorax or pleural effusion. Cardiac size is within normal limits. No acute bone abnormality., IMPRESSION: Interval development of diffuse interstitial infiltrate, atypical infection versus edema. Electronically Signed   By: Helyn Numbers MD   On: 07/10/2021 22:14   VAS [place] LOWER EXTREMITY VENOUS (DVT)  Result Date: 07/11/2021  Lower Venous DVT Study Patient Name:  SONITA MICHIELS  Date of Exam:   07/11/2021 Medical Rec #: [id number]          Accession #:    [id number] Date of Birth: 1981/02/17          Patient Gender: F Patient Age:   71Y Exam Location:  [facility] Procedure:      VAS [place] LOWER EXTREMITY VENOUS (DVT) Referring Phys: [id number] [facility] --------------------------------------------------------------------------------  Indications: SOB.  Limitations: Body habitus, poor ultrasound/tissue interface and depth of vessels, sitting upright on bipap, unable to tolerate some compression maneuvers. Comparison Study: No prior study Performing Technologist: Gertie Fey MHA, RDMS, RVT, RDCS  Examination Guidelines: A complete evaluation includes B-mode imaging, spectral Doppler, color Doppler, and power Doppler as needed of all accessible portions of each vessel. Bilateral testing is considered an integral part of a complete examination. Limited examinations for reoccurring indications may be performed as noted. The reflux portion of the exam is performed with the  patient in reverse Trendelenburg.  +---------+---------------+---------+-----------+---------------+-------------+ RIGHT    CompressibilityPhasicitySpontaneityProperties     Thrombus                                                                 Aging         +---------+---------------+---------+-----------+---------------+-------------+ CFV      Full           Yes      Yes        patent                       +---------+---------------+---------+-----------+---------------+-------------+ SFJ                                         Unable to                                                                visualize                    +---------+---------------+---------+-----------+---------------+-------------+ FV Prox  Full                               patent                       +---------+---------------+---------+-----------+---------------+-------------+ FV Mid   Full  patent                       +---------+---------------+---------+-----------+---------------+-------------+ FV Distal                                   Unable to                                                                visualize                    +---------+---------------+---------+-----------+---------------+-------------+ PFV      Full                               patent                       +---------+---------------+---------+-----------+---------------+-------------+ POP      Full           Yes      Yes        patent                       +---------+---------------+---------+-----------+---------------+-------------+ PTV      Full                               patent                       +---------+---------------+---------+-----------+---------------+-------------+ PERO     Full                               patent                        +---------+---------------+---------+-----------+---------------+-------------+   Right Technical Findings: Not visualized segments include FV distal.  +---------+---------------+---------+-----------+---------------+-------------+ LEFT     CompressibilityPhasicitySpontaneityProperties     Thrombus                                                                 Aging         +---------+---------------+---------+-----------+---------------+-------------+ CFV                     Yes      Yes        patent                       +---------+---------------+---------+-----------+---------------+-------------+ SFJ                                         Unable to  visualize                    +---------+---------------+---------+-----------+---------------+-------------+ FV Prox                          Yes        patent                       +---------+---------------+---------+-----------+---------------+-------------+ FV Mid   Full                               patent                       +---------+---------------+---------+-----------+---------------+-------------+ FV Distal                                   Unable to                                                                visualize                    +---------+---------------+---------+-----------+---------------+-------------+ PFV      Full                    Yes        patent                       +---------+---------------+---------+-----------+---------------+-------------+ POP      Full           Yes      Yes        patent                       +---------+---------------+---------+-----------+---------------+-------------+ PTV      Full                               patent                       +---------+---------------+---------+-----------+---------------+-------------+ PERO     Full                                patent                       +---------+---------------+---------+-----------+---------------+-------------+     Summary: RIGHT: - There is no evidence of deep vein thrombosis in the lower extremity. However, portions of this examination were limited- see technologist comments above.  - No cystic structure found in the popliteal fossa.  LEFT: - There is no evidence of deep vein thrombosis in the lower extremity. However, portions of this examination were limited- see technologist comments above.  - No cystic structure found in the popliteal fossa.  *See table(s) above for measurements and observations. Electronically signed by Heath Lark on 07/11/2021 at 4:44:13 PM.    Final

## 2021-07-12 NOTE — Progress Notes (Signed)
07/12/2021   I have seen and evaluated the patient in f/u for respiratory failure.  S:  No events, feeling better, diuresing well.  Low grade fever recurred. Not eating that while, some low grade nausea.  O: Blood pressure 127/67, pulse 95, temperature 99 F (37.2 C), temperature source Axillary, resp. rate (!) 28, height 5\' 7"  (1.702 m), weight (!) 198.4 kg, SpO2 95 %.  No distress Ongoing edema Lungs diminished at bases, mildly tachypneic Mentating well, moves all 4 ext    A:  -Acute hypoxemic respiratory failure due to likely volume overloaded state of heart -Question of CAP, Pct neg, had low grade fever, leukocytosis: COVID neg, UA benign -Mild anemia, new from prior -Hx CAD, DM2, HLD, HTN  P:  - Push diuresis as tolerated by renal function - Progressive mobility - Complete CAP tx - BIPAP qHS and PRN - Okay for progressive, PCCM available PRN  MD [facility] Prefer epic messenger for cross cover needs If after hours, please call E-link

## 2021-07-12 NOTE — Progress Notes (Signed)
eLink Physician-Brief Progress Note Patient Name: Lindsay [name] DOB: 12/07/1981 MRN: [id number]   Date of Service  07/12/2021  HPI/Events of Note  Fever. Patient is allergic to acetaminophen. She takes ibuprofen at home. Cr 1.08.   eICU Interventions  Ordered ibuprofen 400mg  PO x1 dose.     Intervention Category Minor Interventions: Other:  07/12/2021, 10:25 PM

## 2021-07-13 DIAGNOSIS — E1140 Type 2 diabetes mellitus with diabetic neuropathy, unspecified: Secondary | ICD-10-CM | POA: Diagnosis not present

## 2021-07-13 DIAGNOSIS — I10 Essential (primary) hypertension: Secondary | ICD-10-CM

## 2021-07-13 DIAGNOSIS — Z794 Long term (current) use of insulin: Secondary | ICD-10-CM

## 2021-07-13 DIAGNOSIS — I25119 Atherosclerotic heart disease of native coronary artery with unspecified angina pectoris: Secondary | ICD-10-CM | POA: Diagnosis not present

## 2021-07-13 DIAGNOSIS — J9601 Acute respiratory failure with hypoxia: Secondary | ICD-10-CM | POA: Diagnosis not present

## 2021-07-13 DIAGNOSIS — J189 Pneumonia, unspecified organism: Secondary | ICD-10-CM | POA: Diagnosis not present

## 2021-07-13 LAB — CBC WITH DIFFERENTIAL/PLATELET
Abs Immature Granulocytes: 0.03 10*3/uL (ref 0.00–0.07)
Basophils Absolute: 0 10*3/uL (ref 0.0–0.1)
Basophils Relative: 0 %
Eosinophils Absolute: 0.2 10*3/uL (ref 0.0–0.5)
Eosinophils Relative: 2 %
HCT: 29.1 % — ABNORMAL LOW (ref 36.0–46.0)
Hemoglobin: 8.8 g/dL — ABNORMAL LOW (ref 12.0–15.0)
Immature Granulocytes: 0 %
Lymphocytes Relative: 24 %
Lymphs Abs: 2 10*3/uL (ref 0.7–4.0)
MCH: 25.9 pg — ABNORMAL LOW (ref 26.0–34.0)
MCHC: 30.2 g/dL (ref 30.0–36.0)
MCV: 85.6 fL (ref 80.0–100.0)
Monocytes Absolute: 1.1 10*3/uL — ABNORMAL HIGH (ref 0.1–1.0)
Monocytes Relative: 13 %
Neutro Abs: 5.2 10*3/uL (ref 1.7–7.7)
Neutrophils Relative %: 61 %
Platelets: 249 10*3/uL (ref 150–400)
RBC: 3.4 MIL/uL — ABNORMAL LOW (ref 3.87–5.11)
RDW: 14.7 % (ref 11.5–15.5)
WBC: 8.5 10*3/uL (ref 4.0–10.5)
nRBC: 0 % (ref 0.0–0.2)

## 2021-07-13 LAB — COMPREHENSIVE METABOLIC PANEL
ALT: 29 U/L (ref 0–44)
AST: 33 U/L (ref 15–41)
Albumin: 2.6 g/dL — ABNORMAL LOW (ref 3.5–5.0)
Alkaline Phosphatase: 94 U/L (ref 38–126)
Anion gap: 9 (ref 5–15)
BUN: 23 mg/dL — ABNORMAL HIGH (ref 6–20)
CO2: 26 mmol/L (ref 22–32)
Calcium: 8.5 mg/dL — ABNORMAL LOW (ref 8.9–10.3)
Chloride: 98 mmol/L (ref 98–111)
Creatinine, Ser: 1.4 mg/dL — ABNORMAL HIGH (ref 0.44–1.00)
GFR, Estimated: 49 mL/min — ABNORMAL LOW (ref >60)
Glucose, Bld: 174 mg/dL — ABNORMAL HIGH (ref 70–99)
Potassium: 4.3 mmol/L (ref 3.5–5.1)
Sodium: 133 mmol/L — ABNORMAL LOW (ref 135–145)
Total Bilirubin: 0.7 mg/dL (ref 0.3–1.2)
Total Protein: 7 g/dL (ref 6.5–8.1)

## 2021-07-13 LAB — GLUCOSE, CAPILLARY
Glucose-Capillary: 131 mg/dL — ABNORMAL HIGH (ref 70–99)
Glucose-Capillary: 105 mg/dL — ABNORMAL HIGH (ref 70–99)
Glucose-Capillary: 291 mg/dL — ABNORMAL HIGH (ref 70–99)
Glucose-Capillary: 273 mg/dL — ABNORMAL HIGH (ref 70–99)

## 2021-07-13 LAB — MAGNESIUM: Magnesium: 2 mg/dL (ref 1.7–2.4)

## 2021-07-13 LAB — D-DIMER, QUANTITATIVE: D-Dimer, Quant: 1.92 ug/mL-FEU — ABNORMAL HIGH (ref 0.00–0.50)

## 2021-07-13 LAB — PROCALCITONIN: Procalcitonin: 0.17 ng/mL

## 2021-07-13 LAB — BRAIN NATRIURETIC PEPTIDE: B Natriuretic Peptide: 31.2 pg/mL (ref 0.0–100.0)

## 2021-07-13 LAB — C-REACTIVE PROTEIN: CRP: 19.1 mg/dL — ABNORMAL HIGH (ref <1.0)

## 2021-07-13 MED ORDER — LACTATED RINGERS IV SOLN: INTRAVENOUS | Status: AC

## 2021-07-13 MED ORDER — SODIUM CHLORIDE 0.9 % IV SOLN: INTRAVENOUS | Status: DC | PRN

## 2021-07-13 NOTE — Progress Notes (Signed)
PROGRESS NOTE                                                                                                                                                                                                             Patient Demographics:    Lindsay [name], is a 40 y.o. female, DOB - 01-01-1981, ZOX:[id number]  Outpatient Primary MD for the patient is Grayce Sessions, NP    LOS - 3  Admit date - 07/10/2021    Chief Complaint  Patient presents with   Shortness of Breath       Brief Narrative (HPI from H&P) -  Lindsay [name] is a 40 y.o. female with medical history significant of acute renal failure in 2016, asthma, RUE cellulitis, history of CAD, history of NSTEMI in 2017/status post angioplasty with stent, insulin requiring type II DM, hyperlipidemia, hypertension who is brought to the emergency department via EMS due to progressively worsening dyspnea since 07/09/2021 evening that did not respond to her home albuterol, along with fever, was diagnosed with CAP and admitted.   Subjective:   Patient in bed, appears comfortable, denies any headache, no fever, no chest pain or pressure, much improved shortness of breath , no abdominal pain. No new focal weakness.   Assessment  & Plan :     Acute Hypoxic Resp. Failure due to CAP + acute on chronic diastolic CHF last EF 60% present echo pending - she has been placed on appropriate antibiotics, along with IV Lasix for diuresis and oxygen, nebulizer treatments with supportive care.  She briefly required noninvasive positive pressure ventilation throughout 07/11/2021 due to extreme hypoxia, she was seen by critical care as well.  After diuresis and antibiotics on 07/12/2021 she feels a whole lot better.  Currently on 6 L nasal cannula oxygen with BiPAP schedule at nighttime for OSA.  Continue to monitor closely.    SpO2: 99 % O2 Flow Rate (L/min): 4 L/min FiO2 (%): 40  %  Recent Labs  Lab 07/10/21 2149 07/10/21 2153 07/10/21 2155 07/10/21 2254 07/10/21 2300 07/10/21 2353 07/11/21 0515 07/11/21 0614 07/11/21 1656 07/12/21 0520  WBC  --   --   --   --  10.9*  --   --   --   --  10.0  HGB  --   --   --   --  PROGRESS NOTE                                                                                                                                                                                                             Patient Demographics:    Lindsay [name], is a 40 y.o. female, DOB - 01-01-1981, ZOX:[id number]  Outpatient Primary MD for the patient is Grayce Sessions, NP    LOS - 3  Admit date - 07/10/2021    Chief Complaint  Patient presents with   Shortness of Breath       Brief Narrative (HPI from H&P) -  Lindsay [name] is a 40 y.o. female with medical history significant of acute renal failure in 2016, asthma, RUE cellulitis, history of CAD, history of NSTEMI in 2017/status post angioplasty with stent, insulin requiring type II DM, hyperlipidemia, hypertension who is brought to the emergency department via EMS due to progressively worsening dyspnea since 07/09/2021 evening that did not respond to her home albuterol, along with fever, was diagnosed with CAP and admitted.   Subjective:   Patient in bed, appears comfortable, denies any headache, no fever, no chest pain or pressure, much improved shortness of breath , no abdominal pain. No new focal weakness.   Assessment  & Plan :     Acute Hypoxic Resp. Failure due to CAP + acute on chronic diastolic CHF last EF 60% present echo pending - she has been placed on appropriate antibiotics, along with IV Lasix for diuresis and oxygen, nebulizer treatments with supportive care.  She briefly required noninvasive positive pressure ventilation throughout 07/11/2021 due to extreme hypoxia, she was seen by critical care as well.  After diuresis and antibiotics on 07/12/2021 she feels a whole lot better.  Currently on 6 L nasal cannula oxygen with BiPAP schedule at nighttime for OSA.  Continue to monitor closely.    SpO2: 99 % O2 Flow Rate (L/min): 4 L/min FiO2 (%): 40  %  Recent Labs  Lab 07/10/21 2149 07/10/21 2153 07/10/21 2155 07/10/21 2254 07/10/21 2300 07/10/21 2353 07/11/21 0515 07/11/21 0614 07/11/21 1656 07/12/21 0520  WBC  --   --   --   --  10.9*  --   --   --   --  10.0  HGB  --   --   --   --  PROGRESS NOTE                                                                                                                                                                                                             Patient Demographics:    Lindsay [name], is a 40 y.o. female, DOB - 01-01-1981, ZOX:[id number]  Outpatient Primary MD for the patient is Grayce Sessions, NP    LOS - 3  Admit date - 07/10/2021    Chief Complaint  Patient presents with   Shortness of Breath       Brief Narrative (HPI from H&P) -  Lindsay [name] is a 40 y.o. female with medical history significant of acute renal failure in 2016, asthma, RUE cellulitis, history of CAD, history of NSTEMI in 2017/status post angioplasty with stent, insulin requiring type II DM, hyperlipidemia, hypertension who is brought to the emergency department via EMS due to progressively worsening dyspnea since 07/09/2021 evening that did not respond to her home albuterol, along with fever, was diagnosed with CAP and admitted.   Subjective:   Patient in bed, appears comfortable, denies any headache, no fever, no chest pain or pressure, much improved shortness of breath , no abdominal pain. No new focal weakness.   Assessment  & Plan :     Acute Hypoxic Resp. Failure due to CAP + acute on chronic diastolic CHF last EF 60% present echo pending - she has been placed on appropriate antibiotics, along with IV Lasix for diuresis and oxygen, nebulizer treatments with supportive care.  She briefly required noninvasive positive pressure ventilation throughout 07/11/2021 due to extreme hypoxia, she was seen by critical care as well.  After diuresis and antibiotics on 07/12/2021 she feels a whole lot better.  Currently on 6 L nasal cannula oxygen with BiPAP schedule at nighttime for OSA.  Continue to monitor closely.    SpO2: 99 % O2 Flow Rate (L/min): 4 L/min FiO2 (%): 40  %  Recent Labs  Lab 07/10/21 2149 07/10/21 2153 07/10/21 2155 07/10/21 2254 07/10/21 2300 07/10/21 2353 07/11/21 0515 07/11/21 0614 07/11/21 1656 07/12/21 0520  WBC  --   --   --   --  10.9*  --   --   --   --  10.0  HGB  --   --   --   --  PROGRESS NOTE                                                                                                                                                                                                             Patient Demographics:    Lindsay [name], is a 40 y.o. female, DOB - 01-01-1981, ZOX:[id number]  Outpatient Primary MD for the patient is Grayce Sessions, NP    LOS - 3  Admit date - 07/10/2021    Chief Complaint  Patient presents with   Shortness of Breath       Brief Narrative (HPI from H&P) -  Lindsay [name] is a 40 y.o. female with medical history significant of acute renal failure in 2016, asthma, RUE cellulitis, history of CAD, history of NSTEMI in 2017/status post angioplasty with stent, insulin requiring type II DM, hyperlipidemia, hypertension who is brought to the emergency department via EMS due to progressively worsening dyspnea since 07/09/2021 evening that did not respond to her home albuterol, along with fever, was diagnosed with CAP and admitted.   Subjective:   Patient in bed, appears comfortable, denies any headache, no fever, no chest pain or pressure, much improved shortness of breath , no abdominal pain. No new focal weakness.   Assessment  & Plan :     Acute Hypoxic Resp. Failure due to CAP + acute on chronic diastolic CHF last EF 60% present echo pending - she has been placed on appropriate antibiotics, along with IV Lasix for diuresis and oxygen, nebulizer treatments with supportive care.  She briefly required noninvasive positive pressure ventilation throughout 07/11/2021 due to extreme hypoxia, she was seen by critical care as well.  After diuresis and antibiotics on 07/12/2021 she feels a whole lot better.  Currently on 6 L nasal cannula oxygen with BiPAP schedule at nighttime for OSA.  Continue to monitor closely.    SpO2: 99 % O2 Flow Rate (L/min): 4 L/min FiO2 (%): 40  %  Recent Labs  Lab 07/10/21 2149 07/10/21 2153 07/10/21 2155 07/10/21 2254 07/10/21 2300 07/10/21 2353 07/11/21 0515 07/11/21 0614 07/11/21 1656 07/12/21 0520  WBC  --   --   --   --  10.9*  --   --   --   --  10.0  HGB  --   --   --   --  congestion. Findings compatible with congestive heart failure and edema. No significant pleural effusion. IMPRESSION: Mild progression of pulmonary edema. Electronically Signed   By: Marlan Palau M.D.   On: 07/11/2021 14:04   DG Chest Port 1 View  Result Date: 07/10/2021 CLINICAL DATA:  Dyspnea, fever EXAM: PORTABLE CHEST 1 VIEW COMPARISON:  04/20/2018 FINDINGS: Pulmonary insufflation is normal and symmetric. There has developed diffuse interstitial infiltrate within the lungs, more prevalent within the mid and lower lung zones, atypical infection versus edema. No pneumothorax or pleural effusion. Cardiac size is within normal limits. No acute bone abnormality., IMPRESSION: Interval development of diffuse interstitial infiltrate, atypical infection versus edema. Electronically Signed   By: Helyn Numbers MD   On: 07/10/2021 22:14   ECHOCARDIOGRAM COMPLETE  Result Date: 07/12/2021    ECHOCARDIOGRAM REPORT   Patient Name:   Lindsay [name] Date of Exam: 07/12/2021 Medical Rec #:  [id number]         Height:       67.0 in Accession #:    [id number]        Weight:       437.5 lb Date of Birth:  05/10/1981         BSA:          2.820 m Patient Age:    39 years          BP:           152/82 mmHg Patient Gender: F                 HR:           82 bpm. Exam Location:  Inpatient Procedure: 2D Echo, Cardiac Doppler, Color Doppler and Intracardiac            Opacification Agent  Indications:    CHF-Acute Diastolic 428.31 / I50.31  History:        Patient has prior history of Echocardiogram examinations, most                 recent 09/08/2016. CAD and Previous Myocardial Infarction; Risk                 Factors:Diabetes, Dyslipidemia and Hypertension.  Sonographer:    Eulah Pont RDCS Referring Phys: Effie Shy [facility] [place] [facility]  Sonographer Comments: Patient is morbidly obese. IMPRESSIONS  1. Left ventricular ejection fraction, by estimation, is 65 to 70%. The left ventricle has normal function. Left ventricular endocardial border not optimally defined to evaluate regional wall motion. There is mild left ventricular hypertrophy. Left ventricular diastolic parameters were normal.  2. Right ventricular systolic function is normal. The right ventricular size is normal. There is normal pulmonary artery systolic pressure. The estimated right ventricular systolic pressure is 18.7 mmHg.  3. The mitral valve is grossly normal. Trivial mitral valve regurgitation.  4. The aortic valve is tricuspid. Aortic valve regurgitation is not visualized.  5. The inferior vena cava is normal in size with greater than 50% respiratory variability, suggesting right atrial pressure of 3 mmHg. FINDINGS  Left Ventricle: Left ventricular ejection fraction, by estimation, is 65 to 70%. The left ventricle has normal function. Left ventricular endocardial border not optimally defined to evaluate regional wall motion. Definity contrast agent was given IV to delineate the left ventricular endocardial borders. The left ventricular internal cavity size was normal in size. There is mild left ventricular hypertrophy. Left ventricular diastolic parameters were normal. Right Ventricle: The right ventricular size is normal. No increase in right  congestion. Findings compatible with congestive heart failure and edema. No significant pleural effusion. IMPRESSION: Mild progression of pulmonary edema. Electronically Signed   By: Marlan Palau M.D.   On: 07/11/2021 14:04   DG Chest Port 1 View  Result Date: 07/10/2021 CLINICAL DATA:  Dyspnea, fever EXAM: PORTABLE CHEST 1 VIEW COMPARISON:  04/20/2018 FINDINGS: Pulmonary insufflation is normal and symmetric. There has developed diffuse interstitial infiltrate within the lungs, more prevalent within the mid and lower lung zones, atypical infection versus edema. No pneumothorax or pleural effusion. Cardiac size is within normal limits. No acute bone abnormality., IMPRESSION: Interval development of diffuse interstitial infiltrate, atypical infection versus edema. Electronically Signed   By: Helyn Numbers MD   On: 07/10/2021 22:14   ECHOCARDIOGRAM COMPLETE  Result Date: 07/12/2021    ECHOCARDIOGRAM REPORT   Patient Name:   Lindsay [name] Date of Exam: 07/12/2021 Medical Rec #:  [id number]         Height:       67.0 in Accession #:    [id number]        Weight:       437.5 lb Date of Birth:  05/10/1981         BSA:          2.820 m Patient Age:    39 years          BP:           152/82 mmHg Patient Gender: F                 HR:           82 bpm. Exam Location:  Inpatient Procedure: 2D Echo, Cardiac Doppler, Color Doppler and Intracardiac            Opacification Agent  Indications:    CHF-Acute Diastolic 428.31 / I50.31  History:        Patient has prior history of Echocardiogram examinations, most                 recent 09/08/2016. CAD and Previous Myocardial Infarction; Risk                 Factors:Diabetes, Dyslipidemia and Hypertension.  Sonographer:    Eulah Pont RDCS Referring Phys: Effie Shy [facility] [place] [facility]  Sonographer Comments: Patient is morbidly obese. IMPRESSIONS  1. Left ventricular ejection fraction, by estimation, is 65 to 70%. The left ventricle has normal function. Left ventricular endocardial border not optimally defined to evaluate regional wall motion. There is mild left ventricular hypertrophy. Left ventricular diastolic parameters were normal.  2. Right ventricular systolic function is normal. The right ventricular size is normal. There is normal pulmonary artery systolic pressure. The estimated right ventricular systolic pressure is 18.7 mmHg.  3. The mitral valve is grossly normal. Trivial mitral valve regurgitation.  4. The aortic valve is tricuspid. Aortic valve regurgitation is not visualized.  5. The inferior vena cava is normal in size with greater than 50% respiratory variability, suggesting right atrial pressure of 3 mmHg. FINDINGS  Left Ventricle: Left ventricular ejection fraction, by estimation, is 65 to 70%. The left ventricle has normal function. Left ventricular endocardial border not optimally defined to evaluate regional wall motion. Definity contrast agent was given IV to delineate the left ventricular endocardial borders. The left ventricular internal cavity size was normal in size. There is mild left ventricular hypertrophy. Left ventricular diastolic parameters were normal. Right Ventricle: The right ventricular size is normal. No increase in right  congestion. Findings compatible with congestive heart failure and edema. No significant pleural effusion. IMPRESSION: Mild progression of pulmonary edema. Electronically Signed   By: Marlan Palau M.D.   On: 07/11/2021 14:04   DG Chest Port 1 View  Result Date: 07/10/2021 CLINICAL DATA:  Dyspnea, fever EXAM: PORTABLE CHEST 1 VIEW COMPARISON:  04/20/2018 FINDINGS: Pulmonary insufflation is normal and symmetric. There has developed diffuse interstitial infiltrate within the lungs, more prevalent within the mid and lower lung zones, atypical infection versus edema. No pneumothorax or pleural effusion. Cardiac size is within normal limits. No acute bone abnormality., IMPRESSION: Interval development of diffuse interstitial infiltrate, atypical infection versus edema. Electronically Signed   By: Helyn Numbers MD   On: 07/10/2021 22:14   ECHOCARDIOGRAM COMPLETE  Result Date: 07/12/2021    ECHOCARDIOGRAM REPORT   Patient Name:   Lindsay [name] Date of Exam: 07/12/2021 Medical Rec #:  [id number]         Height:       67.0 in Accession #:    [id number]        Weight:       437.5 lb Date of Birth:  05/10/1981         BSA:          2.820 m Patient Age:    39 years          BP:           152/82 mmHg Patient Gender: F                 HR:           82 bpm. Exam Location:  Inpatient Procedure: 2D Echo, Cardiac Doppler, Color Doppler and Intracardiac            Opacification Agent  Indications:    CHF-Acute Diastolic 428.31 / I50.31  History:        Patient has prior history of Echocardiogram examinations, most                 recent 09/08/2016. CAD and Previous Myocardial Infarction; Risk                 Factors:Diabetes, Dyslipidemia and Hypertension.  Sonographer:    Eulah Pont RDCS Referring Phys: Effie Shy [facility] [place] [facility]  Sonographer Comments: Patient is morbidly obese. IMPRESSIONS  1. Left ventricular ejection fraction, by estimation, is 65 to 70%. The left ventricle has normal function. Left ventricular endocardial border not optimally defined to evaluate regional wall motion. There is mild left ventricular hypertrophy. Left ventricular diastolic parameters were normal.  2. Right ventricular systolic function is normal. The right ventricular size is normal. There is normal pulmonary artery systolic pressure. The estimated right ventricular systolic pressure is 18.7 mmHg.  3. The mitral valve is grossly normal. Trivial mitral valve regurgitation.  4. The aortic valve is tricuspid. Aortic valve regurgitation is not visualized.  5. The inferior vena cava is normal in size with greater than 50% respiratory variability, suggesting right atrial pressure of 3 mmHg. FINDINGS  Left Ventricle: Left ventricular ejection fraction, by estimation, is 65 to 70%. The left ventricle has normal function. Left ventricular endocardial border not optimally defined to evaluate regional wall motion. Definity contrast agent was given IV to delineate the left ventricular endocardial borders. The left ventricular internal cavity size was normal in size. There is mild left ventricular hypertrophy. Left ventricular diastolic parameters were normal. Right Ventricle: The right ventricular size is normal. No increase in right  congestion. Findings compatible with congestive heart failure and edema. No significant pleural effusion. IMPRESSION: Mild progression of pulmonary edema. Electronically Signed   By: Marlan Palau M.D.   On: 07/11/2021 14:04   DG Chest Port 1 View  Result Date: 07/10/2021 CLINICAL DATA:  Dyspnea, fever EXAM: PORTABLE CHEST 1 VIEW COMPARISON:  04/20/2018 FINDINGS: Pulmonary insufflation is normal and symmetric. There has developed diffuse interstitial infiltrate within the lungs, more prevalent within the mid and lower lung zones, atypical infection versus edema. No pneumothorax or pleural effusion. Cardiac size is within normal limits. No acute bone abnormality., IMPRESSION: Interval development of diffuse interstitial infiltrate, atypical infection versus edema. Electronically Signed   By: Helyn Numbers MD   On: 07/10/2021 22:14   ECHOCARDIOGRAM COMPLETE  Result Date: 07/12/2021    ECHOCARDIOGRAM REPORT   Patient Name:   Lindsay [name] Date of Exam: 07/12/2021 Medical Rec #:  [id number]         Height:       67.0 in Accession #:    [id number]        Weight:       437.5 lb Date of Birth:  05/10/1981         BSA:          2.820 m Patient Age:    39 years          BP:           152/82 mmHg Patient Gender: F                 HR:           82 bpm. Exam Location:  Inpatient Procedure: 2D Echo, Cardiac Doppler, Color Doppler and Intracardiac            Opacification Agent  Indications:    CHF-Acute Diastolic 428.31 / I50.31  History:        Patient has prior history of Echocardiogram examinations, most                 recent 09/08/2016. CAD and Previous Myocardial Infarction; Risk                 Factors:Diabetes, Dyslipidemia and Hypertension.  Sonographer:    Eulah Pont RDCS Referring Phys: Effie Shy [facility] [place] [facility]  Sonographer Comments: Patient is morbidly obese. IMPRESSIONS  1. Left ventricular ejection fraction, by estimation, is 65 to 70%. The left ventricle has normal function. Left ventricular endocardial border not optimally defined to evaluate regional wall motion. There is mild left ventricular hypertrophy. Left ventricular diastolic parameters were normal.  2. Right ventricular systolic function is normal. The right ventricular size is normal. There is normal pulmonary artery systolic pressure. The estimated right ventricular systolic pressure is 18.7 mmHg.  3. The mitral valve is grossly normal. Trivial mitral valve regurgitation.  4. The aortic valve is tricuspid. Aortic valve regurgitation is not visualized.  5. The inferior vena cava is normal in size with greater than 50% respiratory variability, suggesting right atrial pressure of 3 mmHg. FINDINGS  Left Ventricle: Left ventricular ejection fraction, by estimation, is 65 to 70%. The left ventricle has normal function. Left ventricular endocardial border not optimally defined to evaluate regional wall motion. Definity contrast agent was given IV to delineate the left ventricular endocardial borders. The left ventricular internal cavity size was normal in size. There is mild left ventricular hypertrophy. Left ventricular diastolic parameters were normal. Right Ventricle: The right ventricular size is normal. No increase in right  congestion. Findings compatible with congestive heart failure and edema. No significant pleural effusion. IMPRESSION: Mild progression of pulmonary edema. Electronically Signed   By: Marlan Palau M.D.   On: 07/11/2021 14:04   DG Chest Port 1 View  Result Date: 07/10/2021 CLINICAL DATA:  Dyspnea, fever EXAM: PORTABLE CHEST 1 VIEW COMPARISON:  04/20/2018 FINDINGS: Pulmonary insufflation is normal and symmetric. There has developed diffuse interstitial infiltrate within the lungs, more prevalent within the mid and lower lung zones, atypical infection versus edema. No pneumothorax or pleural effusion. Cardiac size is within normal limits. No acute bone abnormality., IMPRESSION: Interval development of diffuse interstitial infiltrate, atypical infection versus edema. Electronically Signed   By: Helyn Numbers MD   On: 07/10/2021 22:14   ECHOCARDIOGRAM COMPLETE  Result Date: 07/12/2021    ECHOCARDIOGRAM REPORT   Patient Name:   Lindsay [name] Date of Exam: 07/12/2021 Medical Rec #:  [id number]         Height:       67.0 in Accession #:    [id number]        Weight:       437.5 lb Date of Birth:  05/10/1981         BSA:          2.820 m Patient Age:    39 years          BP:           152/82 mmHg Patient Gender: F                 HR:           82 bpm. Exam Location:  Inpatient Procedure: 2D Echo, Cardiac Doppler, Color Doppler and Intracardiac            Opacification Agent  Indications:    CHF-Acute Diastolic 428.31 / I50.31  History:        Patient has prior history of Echocardiogram examinations, most                 recent 09/08/2016. CAD and Previous Myocardial Infarction; Risk                 Factors:Diabetes, Dyslipidemia and Hypertension.  Sonographer:    Eulah Pont RDCS Referring Phys: Effie Shy [facility] [place] [facility]  Sonographer Comments: Patient is morbidly obese. IMPRESSIONS  1. Left ventricular ejection fraction, by estimation, is 65 to 70%. The left ventricle has normal function. Left ventricular endocardial border not optimally defined to evaluate regional wall motion. There is mild left ventricular hypertrophy. Left ventricular diastolic parameters were normal.  2. Right ventricular systolic function is normal. The right ventricular size is normal. There is normal pulmonary artery systolic pressure. The estimated right ventricular systolic pressure is 18.7 mmHg.  3. The mitral valve is grossly normal. Trivial mitral valve regurgitation.  4. The aortic valve is tricuspid. Aortic valve regurgitation is not visualized.  5. The inferior vena cava is normal in size with greater than 50% respiratory variability, suggesting right atrial pressure of 3 mmHg. FINDINGS  Left Ventricle: Left ventricular ejection fraction, by estimation, is 65 to 70%. The left ventricle has normal function. Left ventricular endocardial border not optimally defined to evaluate regional wall motion. Definity contrast agent was given IV to delineate the left ventricular endocardial borders. The left ventricular internal cavity size was normal in size. There is mild left ventricular hypertrophy. Left ventricular diastolic parameters were normal. Right Ventricle: The right ventricular size is normal. No increase in right  congestion. Findings compatible with congestive heart failure and edema. No significant pleural effusion. IMPRESSION: Mild progression of pulmonary edema. Electronically Signed   By: Marlan Palau M.D.   On: 07/11/2021 14:04   DG Chest Port 1 View  Result Date: 07/10/2021 CLINICAL DATA:  Dyspnea, fever EXAM: PORTABLE CHEST 1 VIEW COMPARISON:  04/20/2018 FINDINGS: Pulmonary insufflation is normal and symmetric. There has developed diffuse interstitial infiltrate within the lungs, more prevalent within the mid and lower lung zones, atypical infection versus edema. No pneumothorax or pleural effusion. Cardiac size is within normal limits. No acute bone abnormality., IMPRESSION: Interval development of diffuse interstitial infiltrate, atypical infection versus edema. Electronically Signed   By: Helyn Numbers MD   On: 07/10/2021 22:14   ECHOCARDIOGRAM COMPLETE  Result Date: 07/12/2021    ECHOCARDIOGRAM REPORT   Patient Name:   Lindsay [name] Date of Exam: 07/12/2021 Medical Rec #:  [id number]         Height:       67.0 in Accession #:    [id number]        Weight:       437.5 lb Date of Birth:  05/10/1981         BSA:          2.820 m Patient Age:    39 years          BP:           152/82 mmHg Patient Gender: F                 HR:           82 bpm. Exam Location:  Inpatient Procedure: 2D Echo, Cardiac Doppler, Color Doppler and Intracardiac            Opacification Agent  Indications:    CHF-Acute Diastolic 428.31 / I50.31  History:        Patient has prior history of Echocardiogram examinations, most                 recent 09/08/2016. CAD and Previous Myocardial Infarction; Risk                 Factors:Diabetes, Dyslipidemia and Hypertension.  Sonographer:    Eulah Pont RDCS Referring Phys: Effie Shy [facility]  Sonographer Comments: Patient is morbidly obese. IMPRESSIONS  1. Left ventricular ejection fraction, by estimation, is 65 to 70%. The left ventricle has normal function. Left ventricular endocardial border not optimally defined to evaluate regional wall motion. There is mild left ventricular hypertrophy. Left ventricular diastolic parameters were normal.  2. Right ventricular systolic function is normal. The right ventricular size is normal. There is normal pulmonary artery systolic pressure. The estimated right ventricular systolic pressure is 18.7 mmHg.  3. The mitral valve is grossly normal. Trivial mitral valve regurgitation.  4. The aortic valve is tricuspid. Aortic valve regurgitation is not visualized.  5. The inferior vena cava is normal in size with greater than 50% respiratory variability, suggesting right atrial pressure of 3 mmHg. FINDINGS  Left Ventricle: Left ventricular ejection fraction, by estimation, is 65 to 70%. The left ventricle has normal function. Left ventricular endocardial border not optimally defined to evaluate regional wall motion. Definity contrast agent was given IV to delineate the left ventricular endocardial borders. The left ventricular internal cavity size was normal in size. There is mild left ventricular hypertrophy. Left ventricular diastolic parameters were normal. Right Ventricle: The right ventricular size is normal. No increase in right

## 2021-07-13 NOTE — Progress Notes (Addendum)
Spoke to Ron (phlebotomy) states that 3 separate techs have attempted blood draw on patient unsuccessfully and per policy no further attempts can be made for 24 hours.    Dr. Candiss Norse aware, verbal order received for foot stick to retrieve labs.    Follow up:  Called Ron with phlebotomy, states that the foot sticks will have to be started tomorrow 07/14/2021 as all of allotted sticks for today have been met.

## 2021-07-13 NOTE — Progress Notes (Signed)
Placed on BIPAP for HS use. °

## 2021-07-14 ENCOUNTER — Inpatient Hospital Stay (HOSPITAL_COMMUNITY): Payer: Medicaid Other

## 2021-07-14 DIAGNOSIS — J811 Chronic pulmonary edema: Secondary | ICD-10-CM | POA: Diagnosis not present

## 2021-07-14 DIAGNOSIS — I517 Cardiomegaly: Secondary | ICD-10-CM | POA: Diagnosis not present

## 2021-07-14 DIAGNOSIS — R0602 Shortness of breath: Secondary | ICD-10-CM | POA: Diagnosis not present

## 2021-07-14 LAB — CBC WITH DIFFERENTIAL/PLATELET
Abs Immature Granulocytes: 0.05 10*3/uL (ref 0.00–0.07)
Basophils Absolute: 0 10*3/uL (ref 0.0–0.1)
Basophils Relative: 0 %
Eosinophils Absolute: 0.1 10*3/uL (ref 0.0–0.5)
Eosinophils Relative: 2 %
HCT: 29 % — ABNORMAL LOW (ref 36.0–46.0)
Hemoglobin: 8.9 g/dL — ABNORMAL LOW (ref 12.0–15.0)
Immature Granulocytes: 1 %
Lymphocytes Relative: 31 %
Lymphs Abs: 2.8 10*3/uL (ref 0.7–4.0)
MCH: 26 pg (ref 26.0–34.0)
MCHC: 30.7 g/dL (ref 30.0–36.0)
MCV: 84.8 fL (ref 80.0–100.0)
Monocytes Absolute: 1.3 10*3/uL — ABNORMAL HIGH (ref 0.1–1.0)
Monocytes Relative: 14 %
Neutro Abs: 4.9 10*3/uL (ref 1.7–7.7)
Neutrophils Relative %: 52 %
Platelets: 264 10*3/uL (ref 150–400)
RBC: 3.42 MIL/uL — ABNORMAL LOW (ref 3.87–5.11)
RDW: 14.7 % (ref 11.5–15.5)
WBC: 9.3 10*3/uL (ref 4.0–10.5)
nRBC: 0 % (ref 0.0–0.2)

## 2021-07-14 LAB — GLUCOSE, CAPILLARY
Glucose-Capillary: 252 mg/dL — ABNORMAL HIGH (ref 70–99)
Glucose-Capillary: 265 mg/dL — ABNORMAL HIGH (ref 70–99)
Glucose-Capillary: 283 mg/dL — ABNORMAL HIGH (ref 70–99)
Glucose-Capillary: 256 mg/dL — ABNORMAL HIGH (ref 70–99)

## 2021-07-14 LAB — MAGNESIUM: Magnesium: 2 mg/dL (ref 1.7–2.4)

## 2021-07-14 LAB — COMPREHENSIVE METABOLIC PANEL
ALT: 31 U/L (ref 0–44)
AST: 34 U/L (ref 15–41)
Albumin: 2.7 g/dL — ABNORMAL LOW (ref 3.5–5.0)
Alkaline Phosphatase: 96 U/L (ref 38–126)
Anion gap: 9 (ref 5–15)
BUN: 27 mg/dL — ABNORMAL HIGH (ref 6–20)
CO2: 26 mmol/L (ref 22–32)
Calcium: 8.3 mg/dL — ABNORMAL LOW (ref 8.9–10.3)
Chloride: 93 mmol/L — ABNORMAL LOW (ref 98–111)
Creatinine, Ser: 1.49 mg/dL — ABNORMAL HIGH (ref 0.44–1.00)
GFR, Estimated: 46 mL/min — ABNORMAL LOW (ref >60)
Glucose, Bld: 261 mg/dL — ABNORMAL HIGH (ref 70–99)
Potassium: 4.9 mmol/L (ref 3.5–5.1)
Sodium: 128 mmol/L — ABNORMAL LOW (ref 135–145)
Total Bilirubin: 0.6 mg/dL (ref 0.3–1.2)
Total Protein: 7.1 g/dL (ref 6.5–8.1)

## 2021-07-14 LAB — URIC ACID: Uric Acid, Serum: 11.2 mg/dL — ABNORMAL HIGH (ref 2.5–7.1)

## 2021-07-14 LAB — HEMOGLOBIN A1C
Hgb A1c MFr Bld: 8.3 % — ABNORMAL HIGH (ref 4.8–5.6)
Mean Plasma Glucose: 192 mg/dL

## 2021-07-14 LAB — CREATININE, URINE, RANDOM: Creatinine, Urine: 193.74 mg/dL

## 2021-07-14 LAB — D-DIMER, QUANTITATIVE: D-Dimer, Quant: 1.5 ug/mL-FEU — ABNORMAL HIGH (ref 0.00–0.50)

## 2021-07-14 LAB — SODIUM, URINE, RANDOM: Sodium, Ur: 21 mmol/L

## 2021-07-14 LAB — C-REACTIVE PROTEIN: CRP: 12.9 mg/dL — ABNORMAL HIGH (ref <1.0)

## 2021-07-14 LAB — BRAIN NATRIURETIC PEPTIDE: B Natriuretic Peptide: 85.1 pg/mL (ref 0.0–100.0)

## 2021-07-14 LAB — OSMOLALITY: Osmolality: 305 mOsm/kg — ABNORMAL HIGH (ref 275–295)

## 2021-07-14 LAB — OSMOLALITY, URINE: Osmolality, Ur: 512 mOsm/kg (ref 300–900)

## 2021-07-14 MED ORDER — LACTATED RINGERS IV SOLN: INTRAVENOUS | Status: DC

## 2021-07-14 MED ORDER — IBUPROFEN 200 MG PO TABS
400.0000 mg | ORAL_TABLET | Freq: Once | ORAL | Status: AC | PRN
  Administered 2021-07-14: 400 mg via ORAL
  Filled 2021-07-14: qty 2

## 2021-07-14 MED ORDER — LACTATED RINGERS IV SOLN: INTRAVENOUS | Status: AC

## 2021-07-14 MED ORDER — MORPHINE SULFATE (PF) 2 MG/ML IV SOLN
2.0000 mg | Freq: Three times a day (TID) | INTRAVENOUS | Status: DC | PRN
Start: 2021-07-14 — End: 2021-07-31
  Administered 2021-07-14 – 2021-07-30 (×24): 2 mg via INTRAVENOUS
  Filled 2021-07-14 (×27): qty 1

## 2021-07-14 MED ORDER — AMLODIPINE BESYLATE 10 MG PO TABS
10.0000 mg | ORAL_TABLET | Freq: Every day | ORAL | Status: DC
  Administered 2021-07-14 – 2021-07-31 (×18): 10 mg via ORAL
  Filled 2021-07-14 (×18): qty 1

## 2021-07-14 MED ORDER — FUROSEMIDE 10 MG/ML IJ SOLN: 40.0000 mg | Freq: Once | INTRAMUSCULAR | Status: DC

## 2021-07-14 MED ORDER — IPRATROPIUM-ALBUTEROL 0.5-2.5 (3) MG/3ML IN SOLN
3.0000 mL | Freq: Two times a day (BID) | RESPIRATORY_TRACT | Status: DC
  Administered 2021-07-14 – 2021-07-31 (×34): 3 mL via RESPIRATORY_TRACT
  Filled 2021-07-14 (×35): qty 3

## 2021-07-14 NOTE — Progress Notes (Signed)
Dear Doctor: This patient has been identified as a candidate for PICC for the following reason (s): poor vasculature, unable to obtain labs, continued infusions (see previous prog. Note). If you agree, please write an order for the indicated device.   Thank you for supporting the early vascular access assessment program.

## 2021-07-14 NOTE — Progress Notes (Signed)
Consult placed for PIV/midline placement. Midline pulled out. 2 IV nurses assessed for placement with [place] and were unsuccessful. Veins deep and branching. Discussed POC with Warden/ranger. Tomasita Morrow, RN VAST

## 2021-07-14 NOTE — Progress Notes (Signed)
Pt placed on BIPAP for night sleep.  Tolerating well at this time.

## 2021-07-14 NOTE — Progress Notes (Signed)
Inpatient Diabetes Program Recommendations  AACE/ADA: New Consensus Statement on Inpatient Glycemic Control (2015)  Target Ranges:  Prepandial:   less than 140 mg/dL      Peak postprandial:   less than 180 mg/dL (1-2 hours)      Critically ill patients:  140 - 180 mg/dL   Lab Results  Component Value Date   GLUCAP 265 (H) 07/14/2021   HGBA1C 8.3 (H) 07/11/2021    Review of Glycemic Control Results for VERBA, AINLEY (MRN [id number]) as of 07/14/2021 14:52  Ref. Range 07/13/2021 16:53 07/13/2021 21:47 07/14/2021 07:27 07/14/2021 11:28  Glucose-Capillary Latest Ref Range: 70 - 99 mg/dL 924 (H) 268 (H) 341 (H) 265 (H)   Diabetes history: DM2 Outpatient Diabetes medications: Jardiance 10 mg QD, Humulin U-500 80 units QAM, 75 units with lunch and 75 units with supper, Victoza 1.8 mg QHS Current orders for Inpatient glycemic control: Novolog 35 units TID, Novolog 0-9 units TID and 0-5 units QHS  Inpatient Diabetes Program Recommendations:    Has received a total of 159 units over 24 hrs.  Please consider adding Basal insulin and increasing correction.    Lantus 30 units (0.15 units/kg)  Novolog 0-20 units TID and QHS  Will continue to follow while inpatient.  Thank you, Dulce Sellar, RN, BSN Diabetes Coordinator Inpatient Diabetes Program [phone] (team pager from 8a-5p)

## 2021-07-15 ENCOUNTER — Inpatient Hospital Stay (HOSPITAL_COMMUNITY): Payer: Medicaid Other

## 2021-07-15 DIAGNOSIS — J9601 Acute respiratory failure with hypoxia: Secondary | ICD-10-CM | POA: Diagnosis not present

## 2021-07-15 DIAGNOSIS — I10 Essential (primary) hypertension: Secondary | ICD-10-CM | POA: Diagnosis not present

## 2021-07-15 DIAGNOSIS — R0602 Shortness of breath: Secondary | ICD-10-CM | POA: Diagnosis not present

## 2021-07-15 DIAGNOSIS — E1140 Type 2 diabetes mellitus with diabetic neuropathy, unspecified: Secondary | ICD-10-CM | POA: Diagnosis not present

## 2021-07-15 DIAGNOSIS — J189 Pneumonia, unspecified organism: Secondary | ICD-10-CM | POA: Diagnosis not present

## 2021-07-15 DIAGNOSIS — I25119 Atherosclerotic heart disease of native coronary artery with unspecified angina pectoris: Secondary | ICD-10-CM | POA: Diagnosis not present

## 2021-07-15 DIAGNOSIS — Z794 Long term (current) use of insulin: Secondary | ICD-10-CM | POA: Diagnosis not present

## 2021-07-15 LAB — CBC WITH DIFFERENTIAL/PLATELET
Abs Immature Granulocytes: 0.07 10*3/uL (ref 0.00–0.07)
Basophils Absolute: 0.1 10*3/uL (ref 0.0–0.1)
Basophils Relative: 1 %
Eosinophils Absolute: 0.1 10*3/uL (ref 0.0–0.5)
Eosinophils Relative: 1 %
HCT: 29.9 % — ABNORMAL LOW (ref 36.0–46.0)
Hemoglobin: 9 g/dL — ABNORMAL LOW (ref 12.0–15.0)
Immature Granulocytes: 1 %
Lymphocytes Relative: 20 %
Lymphs Abs: 2 10*3/uL (ref 0.7–4.0)
MCH: 25.9 pg — ABNORMAL LOW (ref 26.0–34.0)
MCHC: 30.1 g/dL (ref 30.0–36.0)
MCV: 86.2 fL (ref 80.0–100.0)
Monocytes Absolute: 1.1 10*3/uL — ABNORMAL HIGH (ref 0.1–1.0)
Monocytes Relative: 11 %
Neutro Abs: 6.6 10*3/uL (ref 1.7–7.7)
Neutrophils Relative %: 66 %
Platelets: 303 10*3/uL (ref 150–400)
RBC: 3.47 MIL/uL — ABNORMAL LOW (ref 3.87–5.11)
RDW: 14.6 % (ref 11.5–15.5)
WBC: 9.9 10*3/uL (ref 4.0–10.5)
nRBC: 0 % (ref 0.0–0.2)

## 2021-07-15 LAB — COMPREHENSIVE METABOLIC PANEL
ALT: 29 U/L (ref 0–44)
AST: 26 U/L (ref 15–41)
Albumin: 2.9 g/dL — ABNORMAL LOW (ref 3.5–5.0)
Alkaline Phosphatase: 86 U/L (ref 38–126)
Anion gap: 8 (ref 5–15)
BUN: 32 mg/dL — ABNORMAL HIGH (ref 6–20)
CO2: 25 mmol/L (ref 22–32)
Calcium: 8.6 mg/dL — ABNORMAL LOW (ref 8.9–10.3)
Chloride: 97 mmol/L — ABNORMAL LOW (ref 98–111)
Creatinine, Ser: 1.31 mg/dL — ABNORMAL HIGH (ref 0.44–1.00)
GFR, Estimated: 53 mL/min — ABNORMAL LOW (ref >60)
Glucose, Bld: 234 mg/dL — ABNORMAL HIGH (ref 70–99)
Potassium: 5.2 mmol/L — ABNORMAL HIGH (ref 3.5–5.1)
Sodium: 130 mmol/L — ABNORMAL LOW (ref 135–145)
Total Bilirubin: 0.6 mg/dL (ref 0.3–1.2)
Total Protein: 7.2 g/dL (ref 6.5–8.1)

## 2021-07-15 LAB — MAGNESIUM: Magnesium: 2.2 mg/dL (ref 1.7–2.4)

## 2021-07-15 LAB — D-DIMER, QUANTITATIVE: D-Dimer, Quant: 1.35 ug/mL-FEU — ABNORMAL HIGH (ref 0.00–0.50)

## 2021-07-15 LAB — GLUCOSE, CAPILLARY
Glucose-Capillary: 263 mg/dL — ABNORMAL HIGH (ref 70–99)
Glucose-Capillary: 263 mg/dL — ABNORMAL HIGH (ref 70–99)
Glucose-Capillary: 275 mg/dL — ABNORMAL HIGH (ref 70–99)
Glucose-Capillary: 264 mg/dL — ABNORMAL HIGH (ref 70–99)

## 2021-07-15 LAB — C-REACTIVE PROTEIN: CRP: 7.9 mg/dL — ABNORMAL HIGH (ref <1.0)

## 2021-07-15 LAB — BRAIN NATRIURETIC PEPTIDE: B Natriuretic Peptide: 148.1 pg/mL — ABNORMAL HIGH (ref 0.0–100.0)

## 2021-07-15 MED ORDER — TAMSULOSIN HCL 0.4 MG PO CAPS
0.4000 mg | ORAL_CAPSULE | Freq: Every day | ORAL | Status: DC
  Administered 2021-07-15 – 2021-07-31 (×17): 0.4 mg via ORAL
  Filled 2021-07-15 (×17): qty 1

## 2021-07-15 MED ORDER — SODIUM ZIRCONIUM CYCLOSILICATE 10 G PO PACK
10.0000 g | PACK | Freq: Two times a day (BID) | ORAL | Status: AC
  Administered 2021-07-15 (×2): 10 g via ORAL
  Filled 2021-07-15 (×2): qty 1

## 2021-07-15 MED ORDER — LACTATED RINGERS IV SOLN: INTRAVENOUS | Status: AC

## 2021-07-15 MED ORDER — INSULIN ASPART 100 UNIT/ML IJ SOLN
40.0000 [IU] | Freq: Three times a day (TID) | INTRAMUSCULAR | Status: DC
  Administered 2021-07-15 – 2021-07-27 (×32): 40 [IU] via SUBCUTANEOUS

## 2021-07-15 NOTE — Progress Notes (Signed)
PROGRESS NOTE                                                                                                                                                                                                             Patient Demographics:    Lindsay [name], is a 40 y.o. female, DOB - 23-Jun-1981, ZOX:[id number]  Outpatient Primary MD for the patient is Grayce Sessions, NP    LOS - 5  Admit date - 07/10/2021    Chief Complaint  Patient presents with   Shortness of Breath       Brief Narrative (HPI from H&P) -  Lindsay [name] is a 40 y.o. female with medical history significant of acute renal failure in 2016, asthma, RUE cellulitis, history of CAD, history of NSTEMI in 2017/status post angioplasty with stent, insulin requiring type II DM, hyperlipidemia, hypertension who is brought to the emergency department via EMS due to progressively worsening dyspnea since 07/09/2021 evening that did not respond to her home albuterol, along with fever, was diagnosed with CAP along with acute on chronic diastolic CHF and admitted to the hospital.   Subjective:   Patient in bed, appears comfortable, denies any headache, no fever, no chest pain or pressure, no shortness of breath , no abdominal pain. No new focal weakness.  Having some difficulty passing urine.   Assessment  & Plan :     Acute Hypoxic Resp. Failure due to CAP + acute on chronic diastolic CHF last EF 60% present Echo - she has been placed on appropriate antibiotics, has been diuresed with IV Lasix appropriately, continue supplemental daytime oxygen and nighttime BiPAP which she wears at home for OSA.  Continue supportive care with nebulizer treatments.  For now holding further diuresis as I think she is to be in compensated.  Finish total 7-day course of antibiotics.  Overall significantly better however may require home oxygen upon discharge with continued nighttime  BiPAP/CPAP at home for OSA.  She already has a device at home.     SpO2: 99 % O2 Flow Rate (L/min): 4 L/min FiO2 (%): 30 %  Recent Labs  Lab 07/10/21 2149 07/10/21 2153 07/10/21 2155 07/10/21 2254 07/10/21 2300 07/10/21 2353 07/11/21 0515 07/11/21 8119 07/11/21 1656 07/12/21 0520 07/13/21 1219 07/14/21 0214 07/15/21 0029  WBC  --   --   --   --  10.9*  --   --   --   --  10.0 8.5 9.3 9.9  HGB  --   --   --   --  9.7*  --   --   --   --  10.7* 8.8* 8.9* 9.0*  HCT  --   --   --   --  32.0*  --   --   --   --  34.3* 29.1* 29.0* 29.9*  PLT  --   --   --   --  206  --   --   --   --  213 249 264 303  CRP  --   --   --   --   --   --   --   --  26.0* 29.7* 19.1* 12.9* 7.9*  BNP  --   --  103.2*  --   --   --   --   --   --  59.1 31.2 85.1 148.1*  DDIMER  --   --   --   --   --   --   --   --  0.88* 2.78* 1.92* 1.50* 1.35*  PROCALCITON  --   --   --   --   --   --   --  <0.10  --  0.20 0.17  --   --   AST  --   --   --   --   --   --   --   --   --  30 33 34 26  ALT  --   --   --   --   --   --   --   --   --  28 29 31 29   ALKPHOS  --   --   --   --   --   --   --   --   --  98 94 96 86  BILITOT  --   --   --   --   --   --   --   --   --  1.5* 0.7 0.6 0.6  ALBUMIN  --   --   --   --   --   --   --   --   --  3.0* 2.6* 2.7* 2.9*  INR 1.1  --   --   --   --   --   --   --   --   --   --   --   --   LATICACIDVEN  --  2.9*  --   --   --  2.4* 1.5  --   --   --   --   --   --   SARSCOV2NAA  --   --   --  NEGATIVE  --   --   --   --   --   --   --   --   --        2.  Acute on chronic diastolic CHF with EF 60% on last echo.  Improved with IV Lasix.  Monitor  3.  CAD s/p stenting in 2021.  Chest pain-free.  KG nonacute, initial troponin mildly elevated due to demand mismatch, serial troponin stable, continue aspirin, beta-blocker, statin for secondary prevention.  4.  Dyslipidemia.  Placed on statin.  5.  Hypertension.  Blood pressure has improved titrating regimen for better  control.  6.  Morbid obesity with a BMI of 68 along with underlying OSA and OHS.  Daytime oxygen supplementation as above, nighttime CPAP scheduled.  7.  Mild AKI with hyperkalemia.  Hold ARB and NSAIDs.  Diuretics held, gentle hydration with IV fluids, renal function improving.  Continue to monitor.  Given Kayexalate 2 doses on 07/15/2021 for hyperkalemia.  8.  Mildly elevated D-dimer due to inflammation from pneumonia.  On weight-based prophylactic Lovenox dose, leg ultrasound negative on 07/11/2021 Will monitor.  Note D-dimer upon admission was less than 1.  9.  Urinary retention x 3 since 07/14/2021.  Foley and Flomax.    10. DM type II.  Continue home dose Jardiance long with 3 times daily scheduled NovoLog, dose adjusted further on 07/15/2021 for better control, continue sliding scale additionally, will monitor closely.    Lab Results  Component Value Date   HGBA1C 8.3 (H) 07/11/2021   CBG (last 3)  Recent Labs    07/14/21 1656 07/14/21 2005 07/15/21 0730  GLUCAP 283* 256* 263*        Condition - Extremely Guarded  Family Communication  :  daughter bedside on 07/11/2021  Code Status :  Full  Consults  : PCCM  PUD Prophylaxis : None   Procedures  :     R. Midline placed  Bilateral lower extremity venous duplex.  No DVT.  TTE - 1. Left ventricular ejection fraction, by estimation, is 65 to 70%. The left ventricle has normal function. Left ventricular endocardial border not optimally defined to evaluate regional wall motion. There is mild left ventricular hypertrophy. Left ventricular diastolic parameters were normal.  2. Right ventricular systolic function is normal. The right ventricular size is normal. There is normal pulmonary artery systolic pressure. The estimated right ventricular systolic pressure is 18.7 mmHg.  3. The mitral valve is grossly normal. Trivial mitral valve regurgitation.  4. The aortic valve is tricuspid. Aortic valve regurgitation is not visualized.   5. The inferior vena cava is normal in size with greater than 50% respiratory variability, suggesting right atrial pressure of 3 mmHg.      Disposition Plan  :    Status is: Inpatient  Dispo: The patient is from: Home              Anticipated d/c is to: Home              Patient currently is not medically stable to d/c.   Difficult to place patient No  DVT Prophylaxis  :  Lovenox    Lab Results  Component Value Date   PLT 303 07/15/2021    Diet :  Diet Order             Diet heart healthy/carb modified Room service appropriate? Yes; Fluid consistency: Thin  Diet effective now                    Inpatient Medications  Scheduled Meds:  amLODipine  10 mg Oral Daily   aspirin  81 mg Oral Daily   atorvastatin  40 mg Oral Daily   budesonide (PULMICORT) nebulizer solution  0.25 mg Nebulization BID   Chlorhexidine Gluconate Cloth  6 each Topical Daily   DULoxetine  30 mg Oral q AM   empagliflozin  10 mg Oral Daily   enoxaparin (LOVENOX) injection  100 mg Subcutaneous Q24H   gabapentin  600 mg Oral TID   hydrALAZINE  100 mg Oral Q8H   insulin aspart  0-5 Units Subcutaneous QHS  insulin aspart  0-9 Units Subcutaneous TID WC   insulin aspart  40 Units Subcutaneous TID WC   ipratropium-albuterol  3 mL Nebulization BID   isosorbide mononitrate  60 mg Oral Daily   metoprolol tartrate  100 mg Oral BID   mupirocin ointment  1 application Nasal BID   sodium zirconium cyclosilicate  10 g Oral BID   tamsulosin  0.4 mg Oral Daily   Continuous Infusions:  sodium chloride 10 mL/hr at 07/13/21 2224   azithromycin Stopped (07/15/21 0041)   cefTRIAXone (ROCEPHIN)  IV 2 g (07/15/21 0226)   lactated ringers     PRN Meds:.sodium chloride, albuterol, aspirin-acetaminophen-caffeine, baclofen, hydrALAZINE, morphine injection, nitroGLYCERIN, [DISCONTINUED] ondansetron **OR** ondansetron (ZOFRAN) IV  Antibiotics  :    Anti-infectives (From admission, onward)    Start     Dose/Rate  Route Frequency Ordered Stop   07/10/21 2200  cefTRIAXone (ROCEPHIN) 2 g in sodium chloride 0.9 % 100 mL IVPB        2 g 200 mL/hr over 30 Minutes Intravenous Every 24 hours 07/10/21 2153     07/10/21 2200  azithromycin (ZITHROMAX) 500 mg in sodium chloride 0.9 % 250 mL IVPB        500 mg 250 mL/hr over 60 Minutes Intravenous Every 24 hours 07/10/21 2153          Time Spent in minutes  30   Susa Raring M.D on 07/15/2021 at 10:07 AM  To page go to [email or web address]   [facility] -  Office  [phone]    See all Orders from today for further details    Objective:   Vitals:   07/15/21 0600 07/15/21 0732 07/15/21 0800 07/15/21 0836  BP: 138/69 132/60 121/62 132/78  Pulse: 82 81 82 85  Resp: 20 (!) 21 (!) 25   Temp: 99 F (37.2 C) 99.3 F (37.4 C)    TempSrc: Oral Oral    SpO2: 93% 97% 99%   Weight:      Height:        Wt Readings from Last 3 Encounters:  07/10/21 (!) 198.4 kg  06/03/21 (!) 188.6 kg  12/05/20 (!) 186.7 kg     Intake/Output Summary (Last 24 hours) at 07/15/2021 1007 Last data filed at 07/15/2021 0846 Gross per 24 hour  Intake 1390.93 ml  Output 1200 ml  Net 190.93 ml     Physical Exam  Awake Alert, No new F.N deficits, Normal affect [place].AT,PERRAL Supple Neck,No JVD, No cervical lymphadenopathy appriciated.  Symmetrical Chest wall movement, Good air movement bilaterally, CTAB RRR,No Gallops, Rubs or new Murmurs, No Parasternal Heave +ve B.Sounds, Abd Soft, No tenderness, No organomegaly appriciated, No rebound - guarding or rigidity. No Cyanosis, trace leg edema    RN pressure injury documentation: Pressure Ulcer 05/17/15 Stage II -  Partial thickness loss of dermis presenting as a shallow open ulcer with a red, pink wound bed without slough. (Active)  05/17/15 0958  Location: Buttocks  Location Orientation: Right  Staging: Stage II -  Partial thickness loss of dermis presenting as a shallow open ulcer with a red, pink wound bed  without slough.  Wound Description (Comments):   Present on Admission: Yes (patient stated present before admission to hospital)     Data Review:    CBC Recent Labs  Lab 07/10/21 2300 07/12/21 0520 07/13/21 1219 07/14/21 0214 07/15/21 0029  WBC 10.9* 10.0 8.5 9.3 9.9  HGB 9.7* 10.7* 8.8* 8.9* 9.0*  HCT 32.0* 34.3* 29.1* 29.0* 29.9*  PLT 206 213 249 264 303  MCV 84.9 84.5 85.6 84.8 86.2  MCH 25.7* 26.4 25.9* 26.0 25.9*  MCHC 30.3 31.2 30.2 30.7 30.1  RDW 14.5 14.6 14.7 14.7 14.6  LYMPHSABS  --  1.9 2.0 2.8 2.0  MONOABS  --  1.2* 1.1* 1.3* 1.1*  EOSABS  --  0.2 0.2 0.1 0.1  BASOSABS  --  0.0 0.0 0.0 0.1    Recent Labs  Lab 07/10/21 2149 07/10/21 2153 07/10/21 2155 07/10/21 2300 07/10/21 2353 07/11/21 0515 07/11/21 0614 07/11/21 1656 07/11/21 2111 07/12/21 0520 07/13/21 1219 07/14/21 0214 07/15/21 0029  NA  --   --   --  135  --   --   --   --   --  134* 133* 128* 130*  K  --   --   --  4.1  --   --   --   --   --  4.3 4.3 4.9 5.2*  CL  --   --   --  100  --   --   --   --   --  96* 98 93* 97*  CO2  --   --   --  24  --   --   --   --   --  27 26 26 25   GLUCOSE  --   --   --  234*  --   --   --   --   --  217* 174* 261* 234*  BUN  --   --   --  7  --   --   --   --   --  11 23* 27* 32*  CREATININE  --   --   --  0.88  --   --   --   --   --  1.07* 1.40* 1.49* 1.31*  CALCIUM  --   --   --  8.7*  --   --   --   --   --  9.2 8.5* 8.3* 8.6*  AST  --   --   --   --   --   --   --   --   --  30 33 34 26  ALT  --   --   --   --   --   --   --   --   --  28 29 31 29   ALKPHOS  --   --   --   --   --   --   --   --   --  98 94 96 86  BILITOT  --   --   --   --   --   --   --   --   --  1.5* 0.7 0.6 0.6  ALBUMIN  --   --   --   --   --   --   --   --   --  3.0* 2.6* 2.7* 2.9*  MG  --   --   --   --   --   --   --   --   --  1.7 2.0 2.0 2.2  CRP  --   --   --   --   --   --   --  26.0*  --  29.7* 19.1* 12.9* 7.9*  DDIMER  --   --   --   --   --   --   --  0.88*  --  2.78*  1.92* 1.50* 1.35*  PROCALCITON  --   --   --   --   --   --  <0.10  --   --  0.20 0.17  --   --   LATICACIDVEN  --  2.9*  --   --  2.4* 1.5  --   --   --   --   --   --   --   INR 1.1  --   --   --   --   --   --   --   --   --   --   --   --   HGBA1C  --   --   --   --   --   --   --   --  8.3*  --   --   --   --   BNP  --   --  103.2*  --   --   --   --   --   --  59.1 31.2 85.1 148.1*    ------------------------------------------------------------------------------------------------------------------ No results for input(s): CHOL, HDL, LDLCALC, TRIG, CHOLHDL, LDLDIRECT in the last 72 hours.  Lab Results  Component Value Date   HGBA1C 8.3 (H) 07/11/2021   ------------------------------------------------------------------------------------------------------------------ No results for input(s): TSH, T4TOTAL, T3FREE, THYROIDAB in the last 72 hours.  Invalid input(s): FREET3  Cardiac Enzymes No results for input(s): CKMB, TROPONINI, MYOGLOBIN in the last 168 hours.  Invalid input(s): CK ------------------------------------------------------------------------------------------------------------------    Component Value Date/Time   BNP 148.1 (H) 07/15/2021 0029    Radiology Reports DG Chest Port 1 View  Result Date: 07/15/2021 CLINICAL DATA:  Shortness of breath. EXAM: PORTABLE CHEST 1 VIEW COMPARISON:  Chest x-ray 07/14/2021 FINDINGS: The heart is mildly enlarged but appears stable. Peribronchial thickening and increased interstitial markings could suggest mild interstitial edema or bronchitis. No pleural effusions or focal infiltrates. No pneumothorax. IMPRESSION: 1. Stable cardiac enlargement. 2. Peribronchial thickening and increased interstitial markings could suggest mild interstitial edema or bronchitis. Electronically Signed   By: Rudie Meyer M.D.   On: 07/15/2021 07:41   DG Chest Port 1 View  Result Date: 07/14/2021 CLINICAL DATA:  Shortness of breath EXAM: PORTABLE CHEST  1 VIEW COMPARISON:  07/12/2021 FINDINGS: Unchanged mild cardiomegaly. Interval worsening of pulmonary vascular congestion and interstitial pulmonary edema. IMPRESSION: Interval worsening of pulmonary vascular congestion and interstitial pulmonary edema. Electronically Signed   By: Acquanetta Belling M.D.   On: 07/14/2021 07:43   DG Chest Port 1 View  Result Date: 07/12/2021 CLINICAL DATA:  Respiratory failure. EXAM: PORTABLE CHEST 1 VIEW COMPARISON:  07/11/2021 FINDINGS: The cardio pericardial silhouette is enlarged. There is pulmonary vascular congestion without overt pulmonary edema. Interstitial pulmonary edema pattern noted. No substantial pleural effusion. Telemetry leads overlie the chest. IMPRESSION: Enlarged cardiopericardial silhouette with pulmonary vascular congestion and interstitial pulmonary edema pattern. Stable to mildly improved in the interval. Electronically Signed   By: Kennith Center M.D.   On: 07/12/2021 08:24   DG Chest Port 1 View  Result Date: 07/11/2021 CLINICAL DATA:  Short of breath. EXAM: PORTABLE CHEST 1 VIEW COMPARISON:  07/10/2021 FINDINGS: Cardiac enlargement. Progression of diffuse bilateral airspace disease with vascular congestion. Findings compatible with congestive heart failure and edema. No significant pleural effusion. IMPRESSION: Mild progression of pulmonary edema. Electronically Signed   By: Marlan Palau M.D.   On: 07/11/2021 14:04   DG Chest Port 1 View  Result Date: 07/10/2021 CLINICAL  DATA:  Dyspnea, fever EXAM: PORTABLE CHEST 1 VIEW COMPARISON:  04/20/2018 FINDINGS: Pulmonary insufflation is normal and symmetric. There has developed diffuse interstitial infiltrate within the lungs, more prevalent within the mid and lower lung zones, atypical infection versus edema. No pneumothorax or pleural effusion. Cardiac size is within normal limits. No acute bone abnormality., IMPRESSION: Interval development of diffuse interstitial infiltrate, atypical infection versus  edema. Electronically Signed   By: Helyn Numbers MD   On: 07/10/2021 22:14   ECHOCARDIOGRAM COMPLETE  Result Date: 07/12/2021    ECHOCARDIOGRAM REPORT   Patient Name:   MARKESHA HANNIG Date of Exam: 07/12/2021 Medical Rec #:  [id number]         Height:       67.0 in Accession #:    [id number]        Weight:       437.5 lb Date of Birth:  1981/03/17         BSA:          2.820 m Patient Age:    39 years          BP:           152/82 mmHg Patient Gender: F                 HR:           82 bpm. Exam Location:  Inpatient Procedure: 2D Echo, Cardiac Doppler, Color Doppler and Intracardiac            Opacification Agent Indications:    CHF-Acute Diastolic 428.31 / I50.31  History:        Patient has prior history of Echocardiogram examinations, most                 recent 09/08/2016. CAD and Previous Myocardial Infarction; Risk                 Factors:Diabetes, Dyslipidemia and Hypertension.  Sonographer:    Eulah Pont RDCS Referring Phys: Effie Shy [facility] [place] [facility]  Sonographer Comments: Patient is morbidly obese. IMPRESSIONS  1. Left ventricular ejection fraction, by estimation, is 65 to 70%. The left ventricle has normal function. Left ventricular endocardial border not optimally defined to evaluate regional wall motion. There is mild left ventricular hypertrophy. Left ventricular diastolic parameters were normal.  2. Right ventricular systolic function is normal. The right ventricular size is normal. There is normal pulmonary artery systolic pressure. The estimated right ventricular systolic pressure is 18.7 mmHg.  3. The mitral valve is grossly normal. Trivial mitral valve regurgitation.  4. The aortic valve is tricuspid. Aortic valve regurgitation is not visualized.  5. The inferior vena cava is normal in size with greater than 50% respiratory variability, suggesting right atrial pressure of 3 mmHg. FINDINGS  Left Ventricle: Left ventricular ejection fraction, by estimation, is 65 to 70%. The left ventricle has  normal function. Left ventricular endocardial border not optimally defined to evaluate regional wall motion. Definity contrast agent was given IV to delineate the left ventricular endocardial borders. The left ventricular internal cavity size was normal in size. There is mild left ventricular hypertrophy. Left ventricular diastolic parameters were normal. Right Ventricle: The right ventricular size is normal. No increase in right ventricular wall thickness. Right ventricular systolic function is normal. There is normal pulmonary artery systolic pressure. The tricuspid regurgitant velocity is 1.98 m/s, and  with an assumed right atrial pressure of 3 mmHg, the estimated right ventricular systolic pressure is 18.7 mmHg. Left  Atrium: Left atrial size was normal in size. Right Atrium: Right atrial size was normal in size. Pericardium: There is no evidence of pericardial effusion. Mitral Valve: The mitral valve is grossly normal. Mild mitral annular calcification. Trivial mitral valve regurgitation. Tricuspid Valve: The tricuspid valve is grossly normal. Tricuspid valve regurgitation is trivial. Aortic Valve: The aortic valve is tricuspid. Aortic valve regurgitation is not visualized. Pulmonic Valve: The pulmonic valve was not well visualized. Pulmonic valve regurgitation is trivial. Aorta: The aortic root is normal in size and structure. Venous: The inferior vena cava is normal in size with greater than 50% respiratory variability, suggesting right atrial pressure of 3 mmHg. IAS/Shunts: No atrial level shunt detected by color flow Doppler.  LEFT VENTRICLE PLAX 2D LVIDd:         3.90 cm  Diastology LVIDs:         2.60 cm  LV e' medial:    8.03 cm/s LV PW:         1.20 cm  LV E/e' medial:  15.3 LV IVS:        1.10 cm  LV e' lateral:   8.51 cm/s LVOT diam:     2.00 cm  LV E/e' lateral: 14.5 LV SV:         55 LV SV Index:   19 LVOT Area:     3.14 cm  LEFT ATRIUM             Index       RIGHT ATRIUM           Index LA diam:         3.40 cm 1.21 cm/m  RA Area:     13.00 cm LA Vol (A2C):   56.1 ml 19.89 ml/m RA Volume:   29.40 ml  10.42 ml/m LA Vol (A4C):   62.6 ml 22.20 ml/m LA Biplane Vol: 61.3 ml 21.73 ml/m  AORTIC VALVE LVOT Vmax:   98.50 cm/s LVOT Vmean:  62.400 cm/s LVOT VTI:    0.175 m  AORTA Ao Root diam: 3.00 cm Ao Asc diam:  3.20 cm MITRAL VALVE                TRICUSPID VALVE MV Area (PHT): 3.72 cm     TR Peak grad:   15.7 mmHg MV Decel Time: 204 msec     TR Vmax:        198.00 cm/s MV E velocity: 123.00 cm/s MV A velocity: 61.80 cm/s   SHUNTS MV E/A ratio:  1.99         Systemic VTI:  0.18 m                             Systemic Diam: 2.00 cm Nona Dell MD Electronically signed by Nona Dell MD Signature Date/Time: 07/12/2021/12:22:10 PM    Final    VAS [place] LOWER EXTREMITY VENOUS (DVT)  Result Date: 07/11/2021  Lower Venous DVT Study Patient Name:  BRITTANY OSIER  Date of Exam:   07/11/2021 Medical Rec #: [id number]          Accession #:    [id number] Date of Birth: [birth year]          Patient Gender: F Patient Age:   77Y Exam Location:  [facility] Procedure:      VAS [place] LOWER EXTREMITY VENOUS (DVT) Referring Phys: [id number] [place] [facility] --------------------------------------------------------------------------------  Indications: SOB.  Limitations: Body habitus,  poor ultrasound/tissue interface and depth of vessels, sitting upright on bipap, unable to tolerate some compression maneuvers. Comparison Study: No prior study Performing Technologist: Gertie Fey MHA, RDMS, RVT, RDCS  Examination Guidelines: A complete evaluation includes B-mode imaging, spectral Doppler, color Doppler, and power Doppler as needed of all accessible portions of each vessel. Bilateral testing is considered an integral part of a complete examination. Limited examinations for reoccurring indications may be performed as noted. The reflux portion of the exam is performed with the patient in reverse Trendelenburg.   +---------+---------------+---------+-----------+---------------+-------------+ RIGHT    CompressibilityPhasicitySpontaneityProperties     Thrombus                                                                 Aging         +---------+---------------+---------+-----------+---------------+-------------+ CFV      Full           Yes      Yes        patent                       +---------+---------------+---------+-----------+---------------+-------------+ SFJ                                         Unable to                                                                visualize                    +---------+---------------+---------+-----------+---------------+-------------+ FV Prox  Full                               patent                       +---------+---------------+---------+-----------+---------------+-------------+ FV Mid   Full                               patent                       +---------+---------------+---------+-----------+---------------+-------------+ FV Distal                                   Unable to                                                                visualize                    +---------+---------------+---------+-----------+---------------+-------------+ PFV  Full                               patent                       +---------+---------------+---------+-----------+---------------+-------------+ POP      Full           Yes      Yes        patent                       +---------+---------------+---------+-----------+---------------+-------------+ PTV      Full                               patent                       +---------+---------------+---------+-----------+---------------+-------------+ PERO     Full                               patent                       +---------+---------------+---------+-----------+---------------+-------------+   Right Technical Findings:  Not visualized segments include FV distal.  +---------+---------------+---------+-----------+---------------+-------------+ LEFT     CompressibilityPhasicitySpontaneityProperties     Thrombus                                                                 Aging         +---------+---------------+---------+-----------+---------------+-------------+ CFV                     Yes      Yes        patent                       +---------+---------------+---------+-----------+---------------+-------------+ SFJ                                         Unable to                                                                visualize                    +---------+---------------+---------+-----------+---------------+-------------+ FV Prox                          Yes        patent                       +---------+---------------+---------+-----------+---------------+-------------+ FV Mid   Full  patent                       +---------+---------------+---------+-----------+---------------+-------------+ FV Distal                                   Unable to                                                                visualize                    +---------+---------------+---------+-----------+---------------+-------------+ PFV      Full                    Yes        patent                       +---------+---------------+---------+-----------+---------------+-------------+ POP      Full           Yes      Yes        patent                       +---------+---------------+---------+-----------+---------------+-------------+ PTV      Full                               patent                       +---------+---------------+---------+-----------+---------------+-------------+ PERO     Full                               patent                        +---------+---------------+---------+-----------+---------------+-------------+     Summary: RIGHT: - There is no evidence of deep vein thrombosis in the lower extremity. However, portions of this examination were limited- see technologist comments above.  - No cystic structure found in the popliteal fossa.  LEFT: - There is no evidence of deep vein thrombosis in the lower extremity. However, portions of this examination were limited- see technologist comments above.  - No cystic structure found in the popliteal fossa.  *See table(s) above for measurements and observations. Electronically signed by Heath Lark on 07/11/2021 at 4:44:13 PM.    Final

## 2021-07-15 NOTE — TOC Initial Note (Signed)
Transition of Care [facility]) - Initial/Assessment Note    Patient Details  Name: Lindsay [name] MRN: [id number] Date of Birth: 06/07/1981  Transition of Care [facility]) CM/SW Contact:    Gala Lewandowsky, RN Phone Number: 07/15/2021, 3:06 PM  Clinical Narrative:  Case Manager received a consult regarding child welfare. Case Manager spoke with the patient regarding the location of her child. Per the patient, the child is safe with her grandmother and the child's father. Patient expresses that the child and her father do not have the best relationship; however, she is safe. Prior to arrival patient was from home with her child in an apartment. Patient gets medications without any issues and she uses Medicaid Transportation for appointments. Patient states she has durable medical equipment (DME) rolling walker and CPAP at at home. Patient does not currently have home oxygen. Patient may benefit from BIPAP at home. Case Manager will continue to follow for DME needs.                Expected Discharge Plan: Home/Self Care Barriers to Discharge: Continued Medical Work up   Patient Goals and CMS Choice Patient states their goals for this hospitalization and ongoing recovery are:: to return home.   Choice offered to / list presented to : NA  Expected Discharge Plan and Services Expected Discharge Plan: Home/Self Care In-house Referral: NA Discharge Planning Services: CM Consult Post Acute Care Choice: Durable Medical Equipment Living arrangements for the past 2 months: Apartment                 DME Arranged: Oxygen (Case Manager will follow for home 02 needs.)         HH Arranged: NA          Prior Living Arrangements/Services Living arrangements for the past 2 months: Apartment Lives with:: Self, Minor Children Patient language and need for interpreter reviewed:: Yes Do you feel safe going back to the place where you live?: Yes      Need for Family Participation in Patient  Care: Yes (Comment) Care giver support system in place?: Yes (comment) Current home services: DME (Patient currently has cpap in the home no oxygen.) Criminal Activity/Legal Involvement Pertinent to Current Situation/Hospitalization: No - Comment as needed  Activities of Daily Living Home Assistive Devices/Equipment: Eyeglasses, Environmental consultant (specify type) ADL Screening (condition at time of admission) Patient's cognitive ability adequate to safely complete daily activities?: Yes Is the patient deaf or have difficulty hearing?: No Does the patient have difficulty seeing, even when wearing glasses/contacts?: Yes Does the patient have difficulty concentrating, remembering, or making decisions?: No Patient able to express need for assistance with ADLs?: Yes Does the patient have difficulty dressing or bathing?: Yes Independently performs ADLs?: No Communication: Independent Dressing (OT): Needs assistance Is this a change from baseline?: Pre-admission baseline Grooming: Needs assistance Is this a change from baseline?: Pre-admission baseline Feeding: Independent Bathing: Needs assistance Is this a change from baseline?: Pre-admission baseline Toileting: Independent In/Out Bed: Independent Walks in Home: Independent with device (comment) Does the patient have difficulty walking or climbing stairs?: Yes Weakness of Legs: None Weakness of Arms/Hands: None  Permission Sought/Granted Permission sought to share information with : Case Manager, Magazine features editor                Emotional Assessment Appearance:: Appears stated age Attitude/Demeanor/Rapport: Engaged Affect (typically observed): Appropriate Orientation: : Oriented to Self, Oriented to Place, Oriented to  Time, Oriented to Situation Alcohol / Substance Use: Not  Applicable Psych Involvement: No (comment)  Admission diagnosis:  SOB (shortness of breath) [R06.02] CAP (community acquired pneumonia) [J18.9] Acute  respiratory failure with hypoxia (HCC) [J96.01] Patient Active Problem List   Diagnosis Date Noted   CAP (community acquired pneumonia) 07/11/2021   Elevated liver enzymes 05/23/2020   Spondylosis of lumbar spine 06/22/2019   Pedal edema 06/22/2019   Hyperlipidemia 06/22/2019   Normocytic anemia 06/22/2019   Vitamin D deficiency 06/22/2019   OSA (obstructive sleep apnea) 11/03/2016   Coronary artery disease involving native coronary artery of native heart with angina pectoris (HCC) 09/11/2016   Diabetes mellitus with complication (HCC)    Chest pain of uncertain etiology 09/07/2016   Community acquired pneumonia 09/07/2016   MI, old    DM neuropathy, type II diabetes mellitus (HCC) 06/10/2016   Morbid obesity (HCC) 06/10/2016   Pressure ulcer 05/17/2015   Anaerobic abscess (HCC)    ARF (acute renal failure) (HCC)    Septic arthritis (HCC)    Essential hypertension 04/30/2015   Cellulitis 04/30/2015   Abscess of right shoulder    PCP:  Grayce Sessions, NP Pharmacy:   [facility] [phone] Ginette Otto,  - [address] AT [facility] [address]  [place] [address] Phone: [phone] Fax: [phone]   Readmission Risk Interventions No flowsheet data found.

## 2021-07-16 ENCOUNTER — Inpatient Hospital Stay: Payer: Self-pay

## 2021-07-16 DIAGNOSIS — Z794 Long term (current) use of insulin: Secondary | ICD-10-CM | POA: Diagnosis not present

## 2021-07-16 DIAGNOSIS — G4733 Obstructive sleep apnea (adult) (pediatric): Secondary | ICD-10-CM

## 2021-07-16 DIAGNOSIS — I25119 Atherosclerotic heart disease of native coronary artery with unspecified angina pectoris: Secondary | ICD-10-CM | POA: Diagnosis not present

## 2021-07-16 DIAGNOSIS — J189 Pneumonia, unspecified organism: Secondary | ICD-10-CM | POA: Diagnosis not present

## 2021-07-16 DIAGNOSIS — I10 Essential (primary) hypertension: Secondary | ICD-10-CM | POA: Diagnosis not present

## 2021-07-16 DIAGNOSIS — J9601 Acute respiratory failure with hypoxia: Secondary | ICD-10-CM | POA: Diagnosis not present

## 2021-07-16 DIAGNOSIS — E1140 Type 2 diabetes mellitus with diabetic neuropathy, unspecified: Secondary | ICD-10-CM | POA: Diagnosis not present

## 2021-07-16 DIAGNOSIS — D649 Anemia, unspecified: Secondary | ICD-10-CM

## 2021-07-16 LAB — CBC WITH DIFFERENTIAL/PLATELET
Abs Immature Granulocytes: 0.07 10*3/uL (ref 0.00–0.07)
Basophils Absolute: 0.1 10*3/uL (ref 0.0–0.1)
Basophils Relative: 1 %
Eosinophils Absolute: 0.2 10*3/uL (ref 0.0–0.5)
Eosinophils Relative: 3 %
HCT: 30.3 % — ABNORMAL LOW (ref 36.0–46.0)
Hemoglobin: 9.2 g/dL — ABNORMAL LOW (ref 12.0–15.0)
Immature Granulocytes: 1 %
Lymphocytes Relative: 14 %
Lymphs Abs: 1.3 10*3/uL (ref 0.7–4.0)
MCH: 26.1 pg (ref 26.0–34.0)
MCHC: 30.4 g/dL (ref 30.0–36.0)
MCV: 86.1 fL (ref 80.0–100.0)
Monocytes Absolute: 1 10*3/uL (ref 0.1–1.0)
Monocytes Relative: 11 %
Neutro Abs: 6.3 10*3/uL (ref 1.7–7.7)
Neutrophils Relative %: 70 %
Platelets: 295 10*3/uL (ref 150–400)
RBC: 3.52 MIL/uL — ABNORMAL LOW (ref 3.87–5.11)
RDW: 14.3 % (ref 11.5–15.5)
WBC: 8.8 10*3/uL (ref 4.0–10.5)
nRBC: 0 % (ref 0.0–0.2)

## 2021-07-16 LAB — GLUCOSE, CAPILLARY
Glucose-Capillary: 170 mg/dL — ABNORMAL HIGH (ref 70–99)
Glucose-Capillary: 214 mg/dL — ABNORMAL HIGH (ref 70–99)
Glucose-Capillary: 129 mg/dL — ABNORMAL HIGH (ref 70–99)
Glucose-Capillary: 198 mg/dL — ABNORMAL HIGH (ref 70–99)

## 2021-07-16 LAB — COMPREHENSIVE METABOLIC PANEL
ALT: 31 U/L (ref 0–44)
AST: 28 U/L (ref 15–41)
Albumin: 2.9 g/dL — ABNORMAL LOW (ref 3.5–5.0)
Alkaline Phosphatase: 85 U/L (ref 38–126)
Anion gap: 8 (ref 5–15)
BUN: 38 mg/dL — ABNORMAL HIGH (ref 6–20)
CO2: 26 mmol/L (ref 22–32)
Calcium: 8.7 mg/dL — ABNORMAL LOW (ref 8.9–10.3)
Chloride: 95 mmol/L — ABNORMAL LOW (ref 98–111)
Creatinine, Ser: 1.39 mg/dL — ABNORMAL HIGH (ref 0.44–1.00)
GFR, Estimated: 50 mL/min — ABNORMAL LOW (ref >60)
Glucose, Bld: 195 mg/dL — ABNORMAL HIGH (ref 70–99)
Potassium: 4.9 mmol/L (ref 3.5–5.1)
Sodium: 129 mmol/L — ABNORMAL LOW (ref 135–145)
Total Bilirubin: 0.6 mg/dL (ref 0.3–1.2)
Total Protein: 7.5 g/dL (ref 6.5–8.1)

## 2021-07-16 LAB — POCT I-STAT 7, (LYTES, BLD GAS, ICA,H+H)
Acid-Base Excess: 4 mmol/L — ABNORMAL HIGH (ref 0.0–2.0)
Bicarbonate: 28.4 mmol/L — ABNORMAL HIGH (ref 20.0–28.0)
Calcium, Ion: 1.15 mmol/L (ref 1.15–1.40)
HCT: 26 % — ABNORMAL LOW (ref 36.0–46.0)
Hemoglobin: 8.8 g/dL — ABNORMAL LOW (ref 12.0–15.0)
O2 Saturation: 99 %
Patient temperature: 98.2
Potassium: 4.4 mmol/L (ref 3.5–5.1)
Sodium: 132 mmol/L — ABNORMAL LOW (ref 135–145)
TCO2: 30 mmol/L (ref 22–32)
pCO2 arterial: 43.2 mmHg (ref 32.0–48.0)
pH, Arterial: 7.426 (ref 7.350–7.450)
pO2, Arterial: 147 mmHg — ABNORMAL HIGH (ref 83.0–108.0)

## 2021-07-16 LAB — BRAIN NATRIURETIC PEPTIDE: B Natriuretic Peptide: 179.8 pg/mL — ABNORMAL HIGH (ref 0.0–100.0)

## 2021-07-16 LAB — CULTURE, BLOOD (ROUTINE X 2): Culture: NO GROWTH

## 2021-07-16 LAB — C-REACTIVE PROTEIN: CRP: 5 mg/dL — ABNORMAL HIGH (ref <1.0)

## 2021-07-16 MED ORDER — SODIUM CHLORIDE 0.9% FLUSH: 10.0000 mL | INTRAVENOUS | Status: DC | PRN

## 2021-07-16 MED ORDER — SODIUM CHLORIDE 0.9% FLUSH
10.0000 mL | Freq: Two times a day (BID) | INTRAVENOUS | Status: DC
  Administered 2021-07-16 – 2021-07-30 (×27): 10 mL
  Administered 2021-07-31: 30 mL
  Administered 2021-07-31: 10 mL

## 2021-07-16 NOTE — Progress Notes (Signed)
Occupational Therapy Evaluation  Patient lives in apartment with 40 year old daughter. Typically is modified independent with rollator or quad cane and with self care except perianal care. Patient endorses difficulty with reaching behind her after bowel movements. Educate patient on use of toilet tongs and had her simulate in standing. Patient state easier to reach. Patient overall supervision level for sit to stand from recliner x2 and transfer back to bed, needs increased time and use of momentum to power up to standing. Patient will benefit from continued acute OT services to maximize endurance, energy conservation strategies and AE/DME education as needed in order to D/C home safely .     07/16/21 1400  OT Visit Information  Last OT Received On 07/16/21  Assistance Needed +1 (chair follow to progress)  History of Present Illness Pt is a 40 y.o. female who presented 07/10/21 with fever and progressively worsening dyspnea since 07/09/21 that did not respond to her home albuterol. Pt admitted with community acquired pneumonia along with acute on chronic diastolic CHF. PMH: acute renal failure in 2016, asthma, RUE cellulitis, history of CAD, history of NSTEMI in 2017/status post angioplasty with stent, insulin requiring type II DM, hyperlipidemia, hypertension , obesity  Precautions  Precautions Fall  Precaution Comments monitor SpO2  Restrictions  Weight Bearing Restrictions No  Home Living  Family/patient expects to be discharged to: Private residence  Living Arrangements Children (6 yr old)  Available Help at Discharge Family;Available 24 hours/day (initially for 24/7)  Type of Home Apartment  Home Access Stairs to enter (can walk around on grass to level entry if needed)  Entrance Stairs-Number of Steps 2  Entrance Stairs-Rails None  Home Layout One level  Bathroom Shower/Tub Tub/shower unit  Engineer, water - 4 wheels;Cane - quad  Prior Function   Level of Independence Independent with assistive device(s)  Comments Daughter assists with posterior pericare but otherwise pt is mod I, alternating between using her rollator or quad-cane. Pt does not work. Pt uses public transportation.  Communication  Communication No difficulties  Pain Assessment  Pain Assessment Faces  Faces Pain Scale 2  Pain Location generalized grimacing with transfer  Pain Descriptors / Indicators Grimacing  Pain Intervention(s) Monitored during session  Cognition  Arousal/Alertness Awake/alert  Behavior During Therapy WFL for tasks assessed/performed  Overall Cognitive Status Within Functional Limits for tasks assessed  Upper Extremity Assessment  Upper Extremity Assessment Overall WFL for tasks assessed  Lower Extremity Assessment  Lower Extremity Assessment Defer to PT evaluation  Cervical / Trunk Assessment  Cervical / Trunk Assessment Other exceptions  Cervical / Trunk Exceptions increased body habitus  ADL  Overall ADL's  Needs assistance/impaired  Eating/Feeding Independent  Grooming Set up;Sitting  Upper Body Bathing Set up;Sitting  Lower Body Bathing Sit to/from stand;Sitting/lateral leans;Supervison/ safety  Upper Body Dressing  Set up;Sitting  Lower Body Dressing Supervision/safety;Sit to/from stand;Sitting/lateral leans  Teacher, early years/pre Details (indicate cue type and reason) needs increased time and use of momentum to push up from chair to power up to standing no physical assistance  Toileting- Clothing Manipulation and Hygiene Maximal assistance;Sit to/from stand  Toileting - Clothing Manipulation Details (indicate cue type and reason) patient endorses difficulty at home with perianal care, instruct patient in use of toilet tongs. had patient simulate return demonstration in standing with patient stating she can reach in back better  Functional mobility during ADLs Supervision/safety  General  ADL Comments patient primarily limited by decreased  activity tolerance  Bed Mobility  Overal bed mobility Modified Independent  General bed mobility comments head of bed slightly elevated when returning to bed, uses bed rail to assist lowering trunk  Transfers  Overall transfer level Needs assistance  Equipment used Rolling walker (2 wheeled)  Transfers Sit to/from Stand  Sit to Stand Supervision  General transfer comment patient stood from recliner chair twice, no physical assistance either time just needing to use momentum and increased time  Balance  Overall balance assessment Needs assistance  Sitting-balance support Feet supported  Sitting balance-Leahy Scale Good  Standing balance support Single extremity supported;No upper extremity supported  Standing balance-Leahy Scale Fair  Standing balance comment able to take step towards bed without UE support  General Comments  General comments (skin integrity, edema, etc.) VSS on 3.5L O2  OT - End of Session  Equipment Utilized During Treatment Oxygen  Activity Tolerance Patient tolerated treatment well  Patient left in bed;with call bell/phone within reach  Nurse Communication Mobility status  OT Assessment  OT Recommendation/Assessment Patient needs continued OT Services  OT Visit Diagnosis Other abnormalities of gait and mobility (R26.89);Unsteadiness on feet (R26.81)  OT Problem List Obesity;Decreased activity tolerance;Impaired balance (sitting and/or standing);Decreased knowledge of use of DME or AE;Cardiopulmonary status limiting activity  OT Plan  OT Frequency (ACUTE ONLY) Min 2X/week  OT Treatment/Interventions (ACUTE ONLY) Self-care/ADL training;DME and/or AE instruction;Patient/family education;Balance training;Therapeutic activities;Energy conservation  AM-PAC OT "6 Clicks" Daily Activity Outcome Measure (Version 2)  Help from another person eating meals? 4  Help from another person taking care of personal grooming? 3   Help from another person toileting, which includes using toliet, bedpan, or urinal? 2  Help from another person bathing (including washing, rinsing, drying)? 3  Help from another person to put on and taking off regular upper body clothing? 3  Help from another person to put on and taking off regular lower body clothing? 3  6 Click Score 18  Progressive Mobility  What is the highest level of mobility based on the progressive mobility assessment? Level 5 (Walks with assist in room/hall) - Balance while stepping forward/back and can walk in room with assist - Complete  Mobility Ambulated with assistance in room  OT Recommendation  Follow Up Recommendations No OT follow up  OT Equipment Tub/shower seat;Other (comment) (gave patient toilet tongs)  Individuals Consulted  Consulted and Agree with Results and Recommendations Patient  Acute Rehab OT Goals  Patient Stated Goal to go home  OT Goal Formulation With patient  Time For Goal Achievement 07/30/21  Potential to Achieve Goals Good  OT Time Calculation  OT Start Time (ACUTE ONLY) 1300  OT Stop Time (ACUTE ONLY) 1330  OT Time Calculation (min) 30 min  OT General Charges  $OT Visit 1 Visit  OT Evaluation  $OT Eval Low Complexity 1 Low  OT Treatments  $Self Care/Home Management  8-22 mins  Written Expression  Dominant Hand Right   Marlyce Huge OT OT pager: [phone]

## 2021-07-16 NOTE — Care Management (Signed)
07-16-21 1508 Narrative:  Lindsay [name] continues to exhibit signs of hypercapnia associated with obesity hypoventilation syndrome.  The use of the NIV will treat patient's high PC02 levels and can reduce risk of exacerbations and future hospitalizations when used at night and during the day.  All alternate devices [id number] and [id number]) have been proven ineffective to provide essential volume control necessary to maintain acceptable CO2 levels. An NIV with AVAPS AE is necessary to prevent patient harm.  Interruption or failure to provide NIV would quickly lead to exacerbation of the patient's condition, hospital admission, and likely harm to the patient. Continued use is preferred.  Patient is able to protect their airways and clear secretions on their own.

## 2021-07-16 NOTE — Progress Notes (Addendum)
PROGRESS NOTE    Lindsay [name]  HYW:[id number] DOB: 1981-03-20 DOA: 07/10/2021 PCP: Grayce Sessions, NP    Chief Complaint  Patient presents with   Shortness of Breath    Brief Narrative:  Lindsay [name] is a 40 y.o. female with medical history significant of acute renal failure in 2016, asthma, RUE cellulitis, history of CAD, history of NSTEMI in 2017/status post angioplasty with stent, insulin requiring type II DM, hyperlipidemia, hypertension who is brought to the emergency department via EMS due to progressively worsening dyspnea since 07/09/2021 evening that did not respond to her home albuterol, along with fever, was diagnosed with CAP along with acute on chronic diastolic CHF and admitted to the hospital. Assessment & Plan:   Principal Problem:   CAP (community acquired pneumonia) Active Problems:   Essential hypertension   DM neuropathy, type II diabetes mellitus (HCC)   Coronary artery disease involving native coronary artery of native heart with angina pectoris (HCC)   OSA (obstructive sleep apnea)   Hyperlipidemia   Normocytic anemia   Acute respiratory failure with hypoxia probably secondary to a combination of community-acquired pneumonia and acute on chronic diastolic heart here, in the setting of OHS and obstructive sleep apnea. Last echocardiogram showed preserved left ventricular ejection fraction.  Currently continue with IV Rocephin and Zithromax. Continue with BiPAP at night She will probably need oxygen on discharge  VBG results PH-7.42, PCO2-43.2, PO2-147, HCO3-28.4.   Acute on chronic diastolic heart failure  Diuresing with IV lasix.  Continue with strict intake and output and daily weights.     CAD S/P pci IN 2021.  Pt currently denies any chest pain.  Continue with aspirin, bb and statin.    Hypertension Well controlled.    Dyslipidemia:  Resume statin.    Morbid obesity .  Body mass index is 68.52 kg/m. Has underlying OSA  and OHS.  Has CPAP at home, while in the hospital she is using BIPAP. Will probably need trilogy on discharge.    Mild AKI  Possibly from diuresis.     Hyperkalemia:  Resolved.    Urinary retention:  S/p foley.  Continue with flomax.    Type 2 DM.  CBG (last 3)  Recent Labs    07/15/21 2059 07/16/21 0726 07/16/21 1128  GLUCAP 264* 170* 214*   Resume SSi.     DVT prophylaxis: (Lovenox) Code Status: FULL CODE.  Family Communication: (NONE AT BEDSIDE) Disposition:   Status is: Inpatient  Remains inpatient appropriate because:Ongoing diagnostic testing needed not appropriate for outpatient work up and IV treatments appropriate due to intensity of illness or inability to take PO  Dispo: The patient is from: Home              Anticipated d/c is to: Home              Patient currently is not medically stable to d/c.   Difficult to place patient No       Consultants:  NONE.  Procedures: none.  Antimicrobials:  Antibiotics Given (last 72 hours)     Date/Time Action Medication Dose Rate   07/13/21 2226 New Bag/Given   cefTRIAXone (ROCEPHIN) 2 g in sodium chloride 0.9 % 100 mL IVPB 2 g 200 mL/hr   07/14/21 0000 New Bag/Given   azithromycin (ZITHROMAX) 500 mg in sodium chloride 0.9 % 250 mL IVPB 500 mg 250 mL/hr   07/14/21 2341 New Bag/Given   azithromycin (ZITHROMAX) 500 mg in sodium chloride 0.9 %  250 mL IVPB 500 mg 250 mL/hr   07/15/21 0226 New Bag/Given   cefTRIAXone (ROCEPHIN) 2 g in sodium chloride 0.9 % 100 mL IVPB 2 g 200 mL/hr   07/15/21 2204 New Bag/Given   azithromycin (ZITHROMAX) 500 mg in sodium chloride 0.9 % 250 mL IVPB 500 mg 250 mL/hr   07/16/21 0035 New Bag/Given   cefTRIAXone (ROCEPHIN) 2 g in sodium chloride 0.9 % 100 mL IVPB 2 g 200 mL/hr         Subjective: Slightly nauseated.   Objective: Vitals:   07/16/21 0506 07/16/21 0830 07/16/21 1045 07/16/21 1131  BP: (!) 141/82 (!) 141/74  129/69  Pulse: 75 91 71 69  Resp: 20  16 (!)  21  Temp: 98.7 F (37.1 C)   97.8 F (36.6 C)  TempSrc: Oral   Oral  SpO2: 97%  94% 96%  Weight:      Height:        Intake/Output Summary (Last 24 hours) at 07/16/2021 1437 Last data filed at 07/16/2021 1026 Gross per 24 hour  Intake 229.38 ml  Output 1800 ml  Net -1570.62 ml   Filed Weights   07/10/21 2135  Weight: (!) 198.4 kg    Examination:  General exam: Appears calm and comfortable  Respiratory system: diminished air entry throughout the lungs on [place] oxygen.  Cardiovascular system: S1 & S2 heard, RRR. No JVD, murmurs, Gastrointestinal system: Abdomen is nondistended, soft and nontender.  Normal bowel sounds heard. Central nervous system: Alert and oriented. No focal neurological deficits. Extremities: Symmetric 5 x 5 power. Skin: No rashes, lesions or ulcers Psychiatry:Mood & affect appropriate.     Data Reviewed: I have personally reviewed following labs and imaging studies  CBC: Recent Labs  Lab 07/12/21 0520 07/13/21 1219 07/14/21 0214 07/15/21 0029 07/16/21 0114  WBC 10.0 8.5 9.3 9.9 8.8  NEUTROABS 6.7 5.2 4.9 6.6 6.3  HGB 10.7* 8.8* 8.9* 9.0* 9.2*  HCT 34.3* 29.1* 29.0* 29.9* 30.3*  MCV 84.5 85.6 84.8 86.2 86.1  PLT 213 249 264 303 295    Basic Metabolic Panel: Recent Labs  Lab 07/12/21 0520 07/13/21 1219 07/14/21 0214 07/15/21 0029 07/16/21 0114  NA 134* 133* 128* 130* 129*  K 4.3 4.3 4.9 5.2* 4.9  CL 96* 98 93* 97* 95*  CO2 27 26 26 25 26   GLUCOSE 217* 174* 261* 234* 195*  BUN 11 23* 27* 32* 38*  CREATININE 1.07* 1.40* 1.49* 1.31* 1.39*  CALCIUM 9.2 8.5* 8.3* 8.6* 8.7*  MG 1.7 2.0 2.0 2.2  --     GFR: Estimated Creatinine Clearance: 99.8 mL/min (A) (by C-G formula based on SCr of 1.39 mg/dL (H)).  Liver Function Tests: Recent Labs  Lab 07/12/21 0520 07/13/21 1219 07/14/21 0214 07/15/21 0029 07/16/21 0114  AST 30 33 34 26 28  ALT 28 29 31 29 31   ALKPHOS 98 94 96 86 85  BILITOT 1.5* 0.7 0.6 0.6 0.6  PROT 7.5 7.0 7.1 7.2  7.5  ALBUMIN 3.0* 2.6* 2.7* 2.9* 2.9*    CBG: Recent Labs  Lab 07/15/21 1213 07/15/21 1618 07/15/21 2059 07/16/21 0726 07/16/21 1128  GLUCAP 263* 275* 264* 170* 214*     Recent Results (from the past 240 hour(s))  Resp Panel by RT-PCR (Flu A&B, Covid) Nasopharyngeal Swab     Status: None   Collection Time: 07/10/21 10:54 PM   Specimen: Nasopharyngeal Swab; Nasopharyngeal(NP) swabs in vial transport medium  Result Value Ref Range Status   SARS Coronavirus  2 by RT PCR NEGATIVE NEGATIVE Final    Comment: (NOTE) SARS-CoV-2 target nucleic acids are NOT DETECTED.  The SARS-CoV-2 RNA is generally detectable in upper respiratory specimens during the acute phase of infection. The lowest concentration of SARS-CoV-2 viral copies this assay can detect is 138 copies/mL. A negative result does not preclude SARS-Cov-2 infection and should not be used as the sole basis for treatment or other patient management decisions. A negative result may occur with  improper specimen collection/handling, submission of specimen other than nasopharyngeal swab, presence of viral mutation(s) within the areas targeted by this assay, and inadequate number of viral copies(<138 copies/mL). A negative result must be combined with clinical observations, patient history, and epidemiological information. The expected result is Negative.  Fact Sheet for Patients:  BloggerCourse.com  Fact Sheet for Healthcare Providers:  SeriousBroker.it  This test is no t yet approved or cleared by the [place] FDA and  has been authorized for detection and/or diagnosis of SARS-CoV-2 by FDA under an Emergency Use Authorization (EUA). This EUA will remain  in effect (meaning this test can be used) for the duration of the COVID-19 declaration under Section 564(b)(1) of the Act, 21 U.S.C.section 360bbb-3(b)(1), unless the authorization is terminated  or revoked sooner.        Influenza A by PCR NEGATIVE NEGATIVE Final   Influenza B by PCR NEGATIVE NEGATIVE Final    Comment: (NOTE) The Xpert Xpress SARS-CoV-2/FLU/RSV plus assay is intended as an aid in the diagnosis of influenza from Nasopharyngeal swab specimens and should not be used as a sole basis for treatment. Nasal washings and aspirates are unacceptable for Xpert Xpress SARS-CoV-2/FLU/RSV testing.  Fact Sheet for Patients: BloggerCourse.com  Fact Sheet for Healthcare Providers: SeriousBroker.it  This test is not yet approved or cleared by the [place] FDA and has been authorized for detection and/or diagnosis of SARS-CoV-2 by FDA under an Emergency Use Authorization (EUA). This EUA will remain in effect (meaning this test can be used) for the duration of the COVID-19 declaration under Section 564(b)(1) of the Act, 21 U.S.C. section 360bbb-3(b)(1), unless the authorization is terminated or revoked.  Performed at [facility], [address] [place], [place] [address]   Blood Culture (routine x 2)     Status: None   Collection Time: 07/10/21 11:04 PM   Specimen: BLOOD  Result Value Ref Range Status   Specimen Description BLOOD RIGHT ANTECUBITAL  Final   Special Requests   Final    BOTTLES DRAWN AEROBIC AND ANAEROBIC Blood Culture results may not be optimal due to an excessive volume of blood received in culture bottles   Culture   Final    NO GROWTH 5 DAYS Performed at [facility], [address], [place], [place] [address]    Report Status 07/16/2021 FINAL  Final  Urine Culture     Status: Abnormal   Collection Time: 07/11/21  1:17 AM   Specimen: Urine, Random  Result Value Ref Range Status   Specimen Description URINE, RANDOM  Final   Special Requests NONE  Final   Culture (A)  Final    <10,000 COLONIES/mL INSIGNIFICANT GROWTH Performed at [facility], [address], [place], [place] [address]     Report Status 07/12/2021 FINAL  Final  MRSA Next Gen by PCR, Nasal     Status: Abnormal   Collection Time: 07/11/21  6:01 PM   Specimen: Nasal Mucosa; Nasal Swab  Result Value Ref Range Status  MRSA by PCR Next Gen DETECTED (A) NOT DETECTED Final    Comment: RESULT CALLED TO, READ BACK BY AND VERIFIED WITH: E [facility] RN 2108 07/11/21 A BROWNING (NOTE) The GeneXpert MRSA Assay (FDA approved for NASAL specimens only), is one component of a comprehensive MRSA colonization surveillance program. It is not intended to diagnose MRSA infection nor to guide or monitor treatment for MRSA infections. Test performance is not FDA approved in patients less than 56 years old. Performed at [facility], [address] [place], [place] [address]          Radiology Studies: DG Chest Port 1 View  Result Date: 07/15/2021 CLINICAL DATA:  Shortness of breath. EXAM: PORTABLE CHEST 1 VIEW COMPARISON:  Chest x-ray 07/14/2021 FINDINGS: The heart is mildly enlarged but appears stable. Peribronchial thickening and increased interstitial markings could suggest mild interstitial edema or bronchitis. No pleural effusions or focal infiltrates. No pneumothorax. IMPRESSION: 1. Stable cardiac enlargement. 2. Peribronchial thickening and increased interstitial markings could suggest mild interstitial edema or bronchitis. Electronically Signed   By: Rudie Meyer M.D.   On: 07/15/2021 07:41   [place] EKG SITE RITE  Result Date: 07/16/2021 If Site Rite image not attached, placement could not be confirmed due to current cardiac rhythm.       Scheduled Meds:  amLODipine  10 mg Oral Daily   aspirin  81 mg Oral Daily   atorvastatin  40 mg Oral Daily   budesonide (PULMICORT) nebulizer solution  0.25 mg Nebulization BID   Chlorhexidine Gluconate Cloth  6 each Topical Daily   DULoxetine  30 mg Oral q AM   empagliflozin  10 mg Oral Daily   enoxaparin (LOVENOX) injection  100 mg Subcutaneous Q24H   gabapentin  600 mg  Oral TID   hydrALAZINE  100 mg Oral Q8H   insulin aspart  0-5 Units Subcutaneous QHS   insulin aspart  0-9 Units Subcutaneous TID WC   insulin aspart  40 Units Subcutaneous TID WC   ipratropium-albuterol  3 mL Nebulization BID   isosorbide mononitrate  60 mg Oral Daily   metoprolol tartrate  100 mg Oral BID   sodium chloride flush  10-40 mL Intracatheter Q12H   tamsulosin  0.4 mg Oral Daily   Continuous Infusions:  sodium chloride 10 mL/hr at 07/16/21 0034   azithromycin 500 mg (07/15/21 2204)   cefTRIAXone (ROCEPHIN)  IV 2 g (07/16/21 0035)     LOS: 6 days        Kathlen Mody, MD [facility]   To contact the attending provider between 7A-7P or the covering provider during after hours 7P-7A, please log into the web site [email or web address] and access using universal [place] password for that web site. If you do not have the password, please call the hospital operator.  07/16/2021, 2:37 PM

## 2021-07-16 NOTE — Evaluation (Signed)
Physical Therapy Evaluation Patient Details Name: Lindsay [name] MRN: [id number] DOB: 1981/05/11 Today's Date: 07/16/2021   History of Present Illness  Pt is a 40 y.o. female who presented 07/10/21 with fever and progressively worsening dyspnea since 07/09/21 that did not respond to her home albuterol. Pt admitted with community acquired pneumonia along with acute on chronic diastolic CHF. PMH: acute renal failure in 2016, asthma, RUE cellulitis, history of CAD, history of NSTEMI in 2017/status post angioplasty with stent, insulin requiring type II DM, hyperlipidemia, hypertension , obesity   Clinical Impression  Pt presents with condition above and deficits mentioned below, see PT Problem List. PTA, she was mod I using a quad-cane or rollator for mobility, needing assistance only for posterior pericare, and living with her 71 y.o. daughter in a ground-level apartment with 2 STE without rails. Currently, pt displays deficits in generalized strength, balance, coordination, activity tolerance, and aerobic endurance that impact her safety and independence with all functional mobility. Pt is requiring minA to power up to stand from an elevated surface and min guard assist to ambulate a few feet with a RW within her room at this time. Recommending follow-up with HHPT and assistance for mobility going home at this time. Will continue to follow acutely.   SpO2 on 3.5L O2:  supine 87-96% sitting 83-96% Standing/ambulating 91-97% sitting in recliner end of session 96-99%  BP:  supine 122/58 sitting EOB 135/83 with reported dizziness which improved with time standing 146/81     Follow Up Recommendations Home health PT;Supervision for mobility/OOB    Equipment Recommendations  Other (comment) (tub bench)    Recommendations for Other Services       Precautions / Restrictions Precautions Precautions: Fall Precaution Comments: monitor SpO2 Restrictions Weight Bearing Restrictions: No       Mobility  Bed Mobility Overal bed mobility: Modified Independent             General bed mobility comments: Pt able to use bed rail with HOB elevated to transition supine > sit L EOB safely.    Transfers Overall transfer level: Needs assistance Equipment used: Rolling walker (2 wheeled) Transfers: Sit to/from [employer] Sit to Stand: Min assist;From elevated surface Stand pivot transfers: Min guard       General transfer comment: x2 attempts before successful rocking to power up to stand from elelvated EOB, minA to complete. Cues for hand placement on RW and to narrow wide stance. min guard assist for safety with stand step to L with RW bed > recliner. Poor control with descent to chair.  Ambulation/Gait Ambulation/Gait assistance: Min guard Gait Distance (Feet): 3 Feet Assistive device: Rolling walker (2 wheeled) Gait Pattern/deviations: Step-through pattern;Decreased stride length;Shuffle;Wide base of support;Trunk flexed Gait velocity: reduced Gait velocity interpretation: <1.31 ft/sec, indicative of household ambulator General Gait Details: Pt with poor posture and wide BOS, taking small shuffling steps laterally to transfer bed > recliner with RW and min guard assist for safety.  Stairs            Wheelchair Mobility    Modified Rankin (Stroke Patients Only)       Balance Overall balance assessment: Needs assistance Sitting-balance support: No upper extremity supported;Feet supported Sitting balance-Leahy Scale: Fair Sitting balance - Comments: Static sitting EOB with supervision.   Standing balance support: Bilateral upper extremity supported;During functional activity Standing balance-Leahy Scale: Poor Standing balance comment: Reliant on UE support for stability with mobility.  Pertinent Vitals/Pain Pain Assessment: Faces Faces Pain Scale: Hurts a little bit Pain Location: grimacing with  dyspnea Pain Descriptors / Indicators: Grimacing Pain Intervention(s): Limited activity within patient's tolerance;Monitored during session;Repositioned    Home Living Family/patient expects to be discharged to:: Private residence Living Arrangements: Children (20 y.o. daughter) Available Help at Discharge: Family;Available 24 hours/day (initially can be available 24/7) Type of Home: Apartment (ground level) Home Access: Stairs to enter (can walk around on grass if needed) Entrance Stairs-Rails: None Entrance Stairs-Number of Steps: 2 Home Layout: One level Home Equipment: Walker - 4 wheels;Cane - quad      Prior Function Level of Independence: Independent with assistive device(s)         Comments: Daughter assists with posterior pericare but otherwise pt is mod I, alternating between using her rollator or quad-cane. Pt does not work. Pt uses public transportation.     Hand Dominance   Dominant Hand: Right    Extremity/Trunk Assessment   Upper Extremity Assessment Upper Extremity Assessment: Defer to OT evaluation    Lower Extremity Assessment Lower Extremity Assessment: Generalized weakness (hx of peripheral neuropathy)    Cervical / Trunk Assessment Cervical / Trunk Assessment: Other exceptions Cervical / Trunk Exceptions: increased body habitus  Communication   Communication: No difficulties  Cognition Arousal/Alertness: Awake/alert Behavior During Therapy: WFL for tasks assessed/performed Overall Cognitive Status: Within Functional Limits for tasks assessed                                 General Comments: Pt follows commands appropriately and is able to identify when she needs to rest.      General Comments General comments (skin integrity, edema, etc.): SpO2 on 3.5L O2: supine 87-96%, sitting 83-96%, standing 91-97%, sitting in recliner end of session 96-99%; BP supine 122/58, BP sitting EOB 135/83 with reported dizziness which improved with  time, BP standing 146/81; educated pt to perform ankle pumps to manage lower extremity edema    Exercises     Assessment/Plan    PT Assessment Patient needs continued PT services  PT Problem List Decreased strength;Decreased range of motion;Decreased activity tolerance;Decreased balance;Decreased mobility;Decreased coordination;Cardiopulmonary status limiting activity;Impaired sensation;Obesity       PT Treatment Interventions DME instruction;Gait training;Stair training;Functional mobility training;Therapeutic activities;Therapeutic exercise;Balance training;Neuromuscular re-education;Patient/family education    PT Goals (Current goals can be found in the Care Plan section)  Acute Rehab PT Goals Patient Stated Goal: to go home PT Goal Formulation: With patient Time For Goal Achievement: 07/30/21 Potential to Achieve Goals: Good    Frequency Min 3X/week   Barriers to discharge        Co-evaluation               AM-PAC PT "6 Clicks" Mobility  Outcome Measure Help needed turning from your back to your side while in a flat bed without using bedrails?: None Help needed moving from lying on your back to sitting on the side of a flat bed without using bedrails?: None Help needed moving to and from a bed to a chair (including a wheelchair)?: A Little Help needed standing up from a chair using your arms (e.g., wheelchair or bedside chair)?: A Little Help needed to walk in hospital room?: A Little Help needed climbing 3-5 steps with a railing? : A Lot 6 Click Score: 19    End of Session Equipment Utilized During Treatment: Gait belt;Oxygen Activity Tolerance: Patient limited by fatigue  Patient left: in chair;with call bell/phone within reach;with chair alarm set;Other (comment) (with OT) Nurse Communication: Mobility status;Other (comment) (vitals, no cord attaching chair alarm box to wall but chair alarm on) PT Visit Diagnosis: Unsteadiness on feet (R26.81);Other  abnormalities of gait and mobility (R26.89);Muscle weakness (generalized) (M62.81);Difficulty in walking, not elsewhere classified (R26.2)    Time: 9381-8299 PT Time Calculation (min) (ACUTE ONLY): 25 min   Charges:   PT Evaluation $PT Eval Moderate Complexity: 1 Mod PT Treatments $Therapeutic Activity: 8-22 mins        Raymond Gurney, PT, DPT Acute Rehabilitation Services  Pager: [phone] Office: [phone]   Jewel Baize 07/16/2021, 1:19 PM

## 2021-07-16 NOTE — Progress Notes (Signed)
Secure chatted with Dr. Blake Divine and Sandford Craze RN. Please see the previous progress notes from IV team on 7/25. Thank you. Tomasita Morrow, RN VAST

## 2021-07-16 NOTE — Progress Notes (Signed)
Peripherally Inserted Central Catheter Placement  The IV Nurse has discussed with the patient and/or persons authorized to consent for the patient, the purpose of this procedure and the potential benefits and risks involved with this procedure.  The benefits include less needle sticks, lab draws from the catheter, and the patient may be discharged home with the catheter. Risks include, but not limited to, infection, bleeding, blood clot (thrombus formation), and puncture of an artery; nerve damage and irregular heartbeat and possibility to perform a PICC exchange if needed/ordered by physician.  Alternatives to this procedure were also discussed.  Bard Power PICC patient education guide, fact sheet on infection prevention and patient information card has been provided to patient /or left at bedside.    PICC Placement Documentation  PICC Single Lumen 07/16/21 Left Cephalic 48 cm 0 cm (Active)  Indication for Insertion or Continuance of Line Limited venous access - need for IV therapy >5 days (PICC only) 07/16/21 1414  Exposed Catheter (cm) 0 cm 07/16/21 1414  Site Assessment Clean;Dry;Intact 07/16/21 1414  Line Status Flushed;Blood return noted 07/16/21 1414  Dressing Type Transparent 07/16/21 1414  Dressing Status Clean;Dry;Intact 07/16/21 1414  Antimicrobial disc in place? Yes 07/16/21 1414  Dressing Intervention New dressing;Other (Comment) 07/16/21 1414  Dressing Change Due 07/23/21 07/16/21 1414       Lindsay [name] 07/16/2021, 2:16 PM

## 2021-07-16 NOTE — Progress Notes (Signed)
RT unable to obtain enough blood for ABG to be sent to lab. RT was able to run it on ISAT but due to the patient being floor status ISTAT results will not crossover into epic.  ABG results  PH-7.42 PCO2-43.2 HCO3-28.4 PO2-147

## 2021-07-16 NOTE — Progress Notes (Signed)
Placed patient on BIPAP for HS via FFM, 12/6 30%. Tolerating well at this time.

## 2021-07-16 NOTE — Plan of Care (Addendum)
Pt neuro intact cardiac stable. Large bore [place] 4-8L. Foley in place for urinary retention. LBM  7/22 per pt report. Positive Flatus. No skin issues identified. PICC single lumen placed LU extremity. Insulin coverage per sliding scale. Last bg 129. Pt was OOB with PT today. Dyspnea with exertion. Pls see flowsheets for details.    Problem: Education: Goal: Knowledge of General Education information will improve Description: Including pain rating scale, medication(s)/side effects and non-pharmacologic comfort measures Outcome: Progressing   Problem: Activity: Goal: Risk for activity intolerance will decrease Outcome: Progressing   Problem: Coping: Goal: Level of anxiety will decrease Outcome: Progressing   Problem: Elimination: Goal: Will not experience complications related to bowel motility Outcome: Progressing   Problem: Safety: Goal: Ability to remain free from injury will improve Outcome: Progressing

## 2021-07-17 ENCOUNTER — Inpatient Hospital Stay (HOSPITAL_COMMUNITY): Payer: Medicaid Other

## 2021-07-17 DIAGNOSIS — E1140 Type 2 diabetes mellitus with diabetic neuropathy, unspecified: Secondary | ICD-10-CM | POA: Diagnosis not present

## 2021-07-17 DIAGNOSIS — R509 Fever, unspecified: Secondary | ICD-10-CM | POA: Diagnosis not present

## 2021-07-17 DIAGNOSIS — D649 Anemia, unspecified: Secondary | ICD-10-CM | POA: Diagnosis not present

## 2021-07-17 DIAGNOSIS — J9601 Acute respiratory failure with hypoxia: Secondary | ICD-10-CM | POA: Diagnosis not present

## 2021-07-17 DIAGNOSIS — Z794 Long term (current) use of insulin: Secondary | ICD-10-CM | POA: Diagnosis not present

## 2021-07-17 DIAGNOSIS — I509 Heart failure, unspecified: Secondary | ICD-10-CM | POA: Diagnosis not present

## 2021-07-17 DIAGNOSIS — I10 Essential (primary) hypertension: Secondary | ICD-10-CM | POA: Diagnosis not present

## 2021-07-17 DIAGNOSIS — I25119 Atherosclerotic heart disease of native coronary artery with unspecified angina pectoris: Secondary | ICD-10-CM | POA: Diagnosis not present

## 2021-07-17 DIAGNOSIS — J189 Pneumonia, unspecified organism: Secondary | ICD-10-CM | POA: Diagnosis not present

## 2021-07-17 DIAGNOSIS — G4733 Obstructive sleep apnea (adult) (pediatric): Secondary | ICD-10-CM | POA: Diagnosis not present

## 2021-07-17 DIAGNOSIS — I517 Cardiomegaly: Secondary | ICD-10-CM | POA: Diagnosis not present

## 2021-07-17 LAB — CBC WITH DIFFERENTIAL/PLATELET
Abs Immature Granulocytes: 0.04 10*3/uL (ref 0.00–0.07)
Basophils Absolute: 0.1 10*3/uL (ref 0.0–0.1)
Basophils Relative: 1 %
Eosinophils Absolute: 0.2 10*3/uL (ref 0.0–0.5)
Eosinophils Relative: 3 %
HCT: 27.3 % — ABNORMAL LOW (ref 36.0–46.0)
Hemoglobin: 8.4 g/dL — ABNORMAL LOW (ref 12.0–15.0)
Immature Granulocytes: 1 %
Lymphocytes Relative: 19 %
Lymphs Abs: 1.4 10*3/uL (ref 0.7–4.0)
MCH: 26.5 pg (ref 26.0–34.0)
MCHC: 30.8 g/dL (ref 30.0–36.0)
MCV: 86.1 fL (ref 80.0–100.0)
Monocytes Absolute: 0.8 10*3/uL (ref 0.1–1.0)
Monocytes Relative: 11 %
Neutro Abs: 4.8 10*3/uL (ref 1.7–7.7)
Neutrophils Relative %: 65 %
Platelets: 302 10*3/uL (ref 150–400)
RBC: 3.17 MIL/uL — ABNORMAL LOW (ref 3.87–5.11)
RDW: 14.3 % (ref 11.5–15.5)
WBC: 7.3 10*3/uL (ref 4.0–10.5)
nRBC: 0 % (ref 0.0–0.2)

## 2021-07-17 LAB — RETICULOCYTES
Immature Retic Fract: 25.1 % — ABNORMAL HIGH (ref 2.3–15.9)
RBC.: 3.33 MIL/uL — ABNORMAL LOW (ref 3.87–5.11)
Retic Count, Absolute: 84.9 10*3/uL (ref 19.0–186.0)
Retic Ct Pct: 2.6 % (ref 0.4–3.1)

## 2021-07-17 LAB — VITAMIN B12: Vitamin B-12: 385 pg/mL (ref 180–914)

## 2021-07-17 LAB — IRON AND TIBC
Iron: 25 ug/dL — ABNORMAL LOW (ref 28–170)
Saturation Ratios: 7 % — ABNORMAL LOW (ref 10.4–31.8)
TIBC: 351 ug/dL (ref 250–450)
UIBC: 326 ug/dL

## 2021-07-17 LAB — COMPREHENSIVE METABOLIC PANEL
ALT: 29 U/L (ref 0–44)
AST: 26 U/L (ref 15–41)
Albumin: 2.8 g/dL — ABNORMAL LOW (ref 3.5–5.0)
Alkaline Phosphatase: 76 U/L (ref 38–126)
Anion gap: 6 (ref 5–15)
BUN: 40 mg/dL — ABNORMAL HIGH (ref 6–20)
CO2: 28 mmol/L (ref 22–32)
Calcium: 8.5 mg/dL — ABNORMAL LOW (ref 8.9–10.3)
Chloride: 96 mmol/L — ABNORMAL LOW (ref 98–111)
Creatinine, Ser: 1.32 mg/dL — ABNORMAL HIGH (ref 0.44–1.00)
GFR, Estimated: 53 mL/min — ABNORMAL LOW (ref >60)
Glucose, Bld: 150 mg/dL — ABNORMAL HIGH (ref 70–99)
Potassium: 5 mmol/L (ref 3.5–5.1)
Sodium: 130 mmol/L — ABNORMAL LOW (ref 135–145)
Total Bilirubin: 0.3 mg/dL (ref 0.3–1.2)
Total Protein: 6.8 g/dL (ref 6.5–8.1)

## 2021-07-17 LAB — BRAIN NATRIURETIC PEPTIDE: B Natriuretic Peptide: 190.7 pg/mL — ABNORMAL HIGH (ref 0.0–100.0)

## 2021-07-17 LAB — GLUCOSE, CAPILLARY
Glucose-Capillary: 159 mg/dL — ABNORMAL HIGH (ref 70–99)
Glucose-Capillary: 229 mg/dL — ABNORMAL HIGH (ref 70–99)
Glucose-Capillary: 255 mg/dL — ABNORMAL HIGH (ref 70–99)
Glucose-Capillary: 265 mg/dL — ABNORMAL HIGH (ref 70–99)

## 2021-07-17 LAB — FERRITIN: Ferritin: 99 ng/mL (ref 11–307)

## 2021-07-17 LAB — C-REACTIVE PROTEIN: CRP: 3.4 mg/dL — ABNORMAL HIGH (ref <1.0)

## 2021-07-17 LAB — FOLATE: Folate: 13.8 ng/mL (ref >5.9)

## 2021-07-17 MED ORDER — FUROSEMIDE 10 MG/ML IJ SOLN
20.0000 mg | Freq: Every day | INTRAMUSCULAR | Status: DC
  Administered 2021-07-17 – 2021-07-18 (×2): 20 mg via INTRAVENOUS
  Filled 2021-07-17 (×2): qty 2

## 2021-07-17 NOTE — Progress Notes (Signed)
PROGRESS NOTE    Lindsay [name]  IRW:[id number] DOB: 29-Mar-1981 DOA: 07/10/2021 PCP: Grayce Sessions, NP    Chief Complaint  Patient presents with   Shortness of Breath    Brief Narrative:  Lindsay [name] is a 40 y.o. female with medical history significant of acute renal failure in 2016, asthma, RUE cellulitis, history of CAD, history of NSTEMI in 2017/status post angioplasty with stent, insulin requiring type II DM, hyperlipidemia, hypertension who is brought to the emergency department via EMS due to progressively worsening dyspnea since 07/09/2021 evening that did not respond to her home albuterol, along with fever, was diagnosed with CAP along with acute on chronic diastolic CHF and admitted to the hospital. Assessment & Plan:   Principal Problem:   CAP (community acquired pneumonia) Active Problems:   Essential hypertension   DM neuropathy, type II diabetes mellitus (HCC)   Coronary artery disease involving native coronary artery of native heart with angina pectoris (HCC)   OSA (obstructive sleep apnea)   Hyperlipidemia   Normocytic anemia   Acute respiratory failure with hypoxia probably secondary to a combination of community-acquired pneumonia and acute on chronic diastolic heart here, in the setting of OHS and obstructive sleep apnea. Last echocardiogram showed preserved left ventricular ejection fraction.  Completed the course of IV antibiotics.  Continue with BiPAP at night, she is currently on 7 lit of  oxygen, plan to wean her oxygen in the next 24 hours.  She will probably need oxygen on discharge. Recheck CXR in am.   VBG results PH-7.42, PCO2-43.2, PO2-147, HCO3-28.4.   Acute on chronic diastolic heart failure  Diuresing with IV lasix. Monitor renal parameters while on lasix.  Continue with strict intake and output and daily weights.  Diuresed about 4.2 lit since admission.     CAD S/P PCI in 2021. Pt currently denies any chest pain.  Continue  with aspirin, bb and statin.    Hypertension BP parameters are optimal.  Pt on metoprolol 100 mg BID, imdur 60 mg daily, Hydralazine 100 mg TID, amlodipine 10 mg daily.    Dyslipidemia:  Resume statin.    Morbid obesity .  Body mass index is 68.52 kg/m. Has underlying OSA and OHS.  Has CPAP at home, while in the hospital she is using BIPAP. Will probably need trilogy on discharge.    Mild AKI  Creatinine on admission was 0.88 , worsened to 1.4 and improved to 1.3.  Continue to monitor.  Avoid nephrotoxins.     Hyperkalemia:  Resolved.    Urinary retention:  S/p foley.  Continue with flomax.    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  CBG (last 3)  Recent Labs    07/16/21 2146 07/17/21 0747 07/17/21 1253  GLUCAP 198* 159* 229*   Hemoglobin A1c is 8.3% Resume SSI and Jardiance.   Anemia of chronic disease:  Hemoglobin last year around 88. Admitted with a hemoglobin of 9.7, current at 8.4.  Will check anemia panel , get stool for occult blood.   Left upper extremity swelling: post PICC line placement.  Ordered venous duplex of the left upper extremity to evaluate for DVT.      DVT prophylaxis: (Lovenox) Code Status: FULL CODE.  Family Communication: (NONE AT BEDSIDE) Disposition:   Status is: Inpatient  Remains inpatient appropriate because:Ongoing diagnostic testing needed not appropriate for outpatient work up and IV treatments appropriate due to intensity of illness or inability to take PO  Dispo: The patient is from: Home              Anticipated d/c is to: Home              Patient currently is not medically stable to d/c.   Difficult to place patient No       Consultants:  NONE.  Procedures: none.  Antimicrobials:  Antibiotics Given (last 72 hours)     Date/Time Action Medication Dose Rate   07/14/21 2341 New Bag/Given   azithromycin (ZITHROMAX) 500 mg in sodium chloride 0.9 % 250  mL IVPB 500 mg 250 mL/hr   07/15/21 0226 New Bag/Given   cefTRIAXone (ROCEPHIN) 2 g in sodium chloride 0.9 % 100 mL IVPB 2 g 200 mL/hr   07/15/21 2204 New Bag/Given   azithromycin (ZITHROMAX) 500 mg in sodium chloride 0.9 % 250 mL IVPB 500 mg 250 mL/hr   07/16/21 0035 New Bag/Given   cefTRIAXone (ROCEPHIN) 2 g in sodium chloride 0.9 % 100 mL IVPB 2 g 200 mL/hr   07/16/21 2154 New Bag/Given   cefTRIAXone (ROCEPHIN) 2 g in sodium chloride 0.9 % 100 mL IVPB 2 g 200 mL/hr   07/16/21 2317 New Bag/Given   azithromycin (ZITHROMAX) 500 mg in sodium chloride 0.9 % 250 mL IVPB 500 mg 250 mL/hr         Subjective: Reports that she feels like fluid has built up.   Objective: Vitals:   07/17/21 0423 07/17/21 0800 07/17/21 0818 07/17/21 1332  BP: 127/76 (!) 119/58 (!) 119/58 140/68  Pulse: 73 81 78 65  Resp: 17 15 12 18   Temp: 98.6 F (37 C) 99 F (37.2 C)  97.8 F (36.6 C)  TempSrc: Axillary Oral  Oral  SpO2: 95% 98% 98% 98%  Weight:      Height:        Intake/Output Summary (Last 24 hours) at 07/17/2021 1522 Last data filed at 07/17/2021 0810 Gross per 24 hour  Intake 30 ml  Output 1550 ml  Net -1520 ml    Filed Weights   07/10/21 2135  Weight: (!) 198.4 kg    Examination:  General exam: Alert and comfortable on 7 lit of [place] oxygen .  Respiratory system: diminished air entry throughout , no wheezing heard. On 7 lit of  oxygen.  Cardiovascular system: S1 & S2 heard, RRR, pedal edema. Present.  Gastrointestinal system: Abdomen is soft, NT ND BS+ Central nervous system: Alert AND ORIENTED,  Extremities: no cyanosis.  Skin: No rashes seen.  Psychiatry:Mood is appropriate.     Data Reviewed: I have personally reviewed following labs and imaging studies  CBC: Recent Labs  Lab 07/13/21 1219 07/14/21 0214 07/15/21 0029 07/16/21 0114 07/16/21 1602 07/17/21 0610  WBC 8.5 9.3 9.9 8.8  --  7.3  NEUTROABS 5.2 4.9 6.6 6.3  --  4.8  HGB 8.8* 8.9* 9.0* 9.2* 8.8* 8.4*   HCT 29.1* 29.0* 29.9* 30.3* 26.0* 27.3*  MCV 85.6 84.8 86.2 86.1  --  86.1  PLT 249 264 303 295  --  302     Basic Metabolic Panel: Recent Labs  Lab 07/12/21 0520 07/13/21 1219 07/14/21 0214 07/15/21 0029 07/16/21 0114 07/16/21 1602 07/17/21 0610  NA 134* 133* 128* 130* 129* 132* 130*  K 4.3 4.3 4.9 5.2* 4.9 4.4 5.0  CL 96* 98 93* 97* 95*  --  96*  CO2 27 26 26 25 26   --  28  GLUCOSE 217* 174* 261* 234* 195*  --  150*  BUN 11 23* 27* 32* 38*  --  40*  CREATININE 1.07* 1.40* 1.49* 1.31* 1.39*  --  1.32*  CALCIUM 9.2 8.5* 8.3* 8.6* 8.7*  --  8.5*  MG 1.7 2.0 2.0 2.2  --   --   --      GFR: Estimated Creatinine Clearance: 105.1 mL/min (A) (by C-G formula based on SCr of 1.32 mg/dL (H)).  Liver Function Tests: Recent Labs  Lab 07/13/21 1219 07/14/21 0214 07/15/21 0029 07/16/21 0114 07/17/21 0610  AST 33 34 26 28 26   ALT 29 31 29 31 29   ALKPHOS 94 96 86 85 76  BILITOT 0.7 0.6 0.6 0.6 0.3  PROT 7.0 7.1 7.2 7.5 6.8  ALBUMIN 2.6* 2.7* 2.9* 2.9* 2.8*     CBG: Recent Labs  Lab 07/16/21 1128 07/16/21 1646 07/16/21 2146 07/17/21 0747 07/17/21 1253  GLUCAP 214* 129* 198* 159* 229*      Recent Results (from the past 240 hour(s))  Resp Panel by RT-PCR (Flu A&B, Covid) Nasopharyngeal Swab     Status: None   Collection Time: 07/10/21 10:54 PM   Specimen: Nasopharyngeal Swab; Nasopharyngeal(NP) swabs in vial transport medium  Result Value Ref Range Status   SARS Coronavirus 2 by RT PCR NEGATIVE NEGATIVE Final    Comment: (NOTE) SARS-CoV-2 target nucleic acids are NOT DETECTED.  The SARS-CoV-2 RNA is generally detectable in upper respiratory specimens during the acute phase of infection. The lowest concentration of SARS-CoV-2 viral copies this assay can detect is 138 copies/mL. A negative result does not preclude SARS-Cov-2 infection and should not be used as the sole basis for treatment or other patient management decisions. A negative result may occur with   improper specimen collection/handling, submission of specimen other than nasopharyngeal swab, presence of viral mutation(s) within the areas targeted by this assay, and inadequate number of viral copies(<138 copies/mL). A negative result must be combined with clinical observations, patient history, and epidemiological information. The expected result is Negative.  Fact Sheet for Patients:  BloggerCourse.com  Fact Sheet for Healthcare Providers:  SeriousBroker.it  This test is no t yet approved or cleared by the [place] FDA and  has been authorized for detection and/or diagnosis of SARS-CoV-2 by FDA under an Emergency Use Authorization (EUA). This EUA will remain  in effect (meaning this test can be used) for the duration of the COVID-19 declaration under Section 564(b)(1) of the Act, 21 U.S.C.section 360bbb-3(b)(1), unless the authorization is terminated  or revoked sooner.       Influenza A by PCR NEGATIVE NEGATIVE Final   Influenza B by PCR NEGATIVE NEGATIVE Final    Comment: (NOTE) The Xpert Xpress SARS-CoV-2/FLU/RSV plus assay is intended as an aid in the diagnosis of influenza from Nasopharyngeal swab specimens and should not be used as a sole basis for treatment. Nasal washings and aspirates are unacceptable for Xpert Xpress SARS-CoV-2/FLU/RSV testing.  Fact Sheet for Patients: BloggerCourse.com  Fact Sheet for Healthcare Providers: SeriousBroker.it  This test is not yet approved or cleared by the [place] FDA and has been authorized for detection and/or diagnosis of SARS-CoV-2 by FDA under an Emergency Use Authorization (EUA). This EUA will remain in effect (meaning this test can be used) for the duration of the COVID-19 declaration under Section 564(b)(1) of the Act, 21 U.S.C. section 360bbb-3(b)(1), unless the authorization is terminated  or revoked.  Performed at [facility], [address] [place], [place] [address]   Blood Culture (routine x 2)  Status: None   Collection Time: 07/10/21 11:04 PM   Specimen: BLOOD  Result Value Ref Range Status   Specimen Description BLOOD RIGHT ANTECUBITAL  Final   Special Requests   Final    BOTTLES DRAWN AEROBIC AND ANAEROBIC Blood Culture results may not be optimal due to an excessive volume of blood received in culture bottles   Culture   Final    NO GROWTH 5 DAYS Performed at [facility], [address] [place], [place] [address]    Report Status 07/16/2021 FINAL  Final  Urine Culture     Status: Abnormal   Collection Time: 07/11/21  1:17 AM   Specimen: Urine, Random  Result Value Ref Range Status   Specimen Description URINE, RANDOM  Final   Special Requests NONE  Final   Culture (A)  Final    <10,000 COLONIES/mL INSIGNIFICANT GROWTH Performed at [facility], [address], [place], [place] [address]    Report Status 07/12/2021 FINAL  Final  MRSA Next Gen by PCR, Nasal     Status: Abnormal   Collection Time: 07/11/21  6:01 PM   Specimen: Nasal Mucosa; Nasal Swab  Result Value Ref Range Status   MRSA by PCR Next Gen DETECTED (A) NOT DETECTED Final    Comment: RESULT CALLED TO, READ BACK BY AND VERIFIED WITH: E [facility] RN 2108 07/11/21 A BROWNING (NOTE) The GeneXpert MRSA Assay (FDA approved for NASAL specimens only), is one component of a comprehensive MRSA colonization surveillance program. It is not intended to diagnose MRSA infection nor to guide or monitor treatment for MRSA infections. Test performance is not FDA approved in patients less than 99 years old. Performed at [facility], [address], [place], [place] [address]           Radiology Studies: [place] EKG SITE RITE  Result Date: 07/16/2021 [facility] image not attached, placement could not be confirmed due to current cardiac rhythm.       Scheduled Meds:   amLODipine  10 mg Oral Daily   aspirin  81 mg Oral Daily   atorvastatin  40 mg Oral Daily   budesonide (PULMICORT) nebulizer solution  0.25 mg Nebulization BID   Chlorhexidine Gluconate Cloth  6 each Topical Daily   DULoxetine  30 mg Oral q AM   empagliflozin  10 mg Oral Daily   enoxaparin (LOVENOX) injection  100 mg Subcutaneous Q24H   gabapentin  600 mg Oral TID   hydrALAZINE  100 mg Oral Q8H   insulin aspart  0-5 Units Subcutaneous QHS   insulin aspart  0-9 Units Subcutaneous TID WC   insulin aspart  40 Units Subcutaneous TID WC   ipratropium-albuterol  3 mL Nebulization BID   isosorbide mononitrate  60 mg Oral Daily   metoprolol tartrate  100 mg Oral BID   sodium chloride flush  10-40 mL Intracatheter Q12H   tamsulosin  0.4 mg Oral Daily   Continuous Infusions:  sodium chloride 10 mL/hr at 07/16/21 2153     LOS: 7 days        Kathlen Mody, MD [facility]   To contact the attending provider between 7A-7P or the covering provider during after hours 7P-7A, please log into the web site [email or web address] and access using universal [place] password for that web site. If you do not have the password, please call the hospital operator.  07/17/2021, 3:22 PM

## 2021-07-17 NOTE — Progress Notes (Signed)
Physical Therapy Treatment Patient Details Name: Lindsay [name] MRN: [id number] DOB: 1981-08-09 Today's Date: 07/17/2021    History of Present Illness Pt is a 40 y.o. female who presented 07/10/21 with fever and progressively worsening dyspnea since 07/09/21 that did not respond to her home albuterol. Pt admitted with community acquired pneumonia along with acute on chronic diastolic CHF. PMH: acute renal failure in 2016, asthma, RUE cellulitis, history of CAD, history of NSTEMI in 2017/status post angioplasty with stent, insulin requiring type II DM, hyperlipidemia, hypertension , obesity    PT Comments    Pt with slow progress with mobility. Pt only able to amb a few feet before needing to sit and rest due to dyspnea and fatigue. Pt feels her legs continue to have more fluid on them making mobility more difficult.    Follow Up Recommendations  Home health PT;Supervision for mobility/OOB     Equipment Recommendations  None recommended by PT    Recommendations for Other Services       Precautions / Restrictions Precautions Precautions: Fall Precaution Comments: monitor SpO2    Mobility  Bed Mobility Overal bed mobility: Modified Independent             General bed mobility comments: HOB elevated and use of rail and incr time    Transfers Overall transfer level: Needs assistance Equipment used: 4-wheeled walker Transfers: Sit to/from Stand Sit to Stand: Min assist;+2 physical assistance         General transfer comment: Assist to bring hips up to stand and to control descent to chair.  Ambulation/Gait Ambulation/Gait assistance: Min guard;+2 safety/equipment Gait Distance (Feet): 6 Feet (6' x 1, 3' x 1) Assistive device: 4-wheeled walker Gait Pattern/deviations: Step-through pattern;Decreased stride length;Shuffle;Wide base of support Gait velocity: decr Gait velocity interpretation: <1.31 ft/sec, indicative of household ambulator General Gait Details: Assist  for safety/lines and chair follow due to quick fatigue.   Stairs             Wheelchair Mobility    Modified Rankin (Stroke Patients Only)       Balance Overall balance assessment: Needs assistance Sitting-balance support: No upper extremity supported;Feet supported Sitting balance-Leahy Scale: Fair     Standing balance support: Bilateral upper extremity supported;During functional activity Standing balance-Leahy Scale: Poor Standing balance comment: walker and supervision for static standing                            Cognition Arousal/Alertness: Awake/alert Behavior During Therapy: WFL for tasks assessed/performed Overall Cognitive Status: Within Functional Limits for tasks assessed                                        Exercises      General Comments General comments (skin integrity, edema, etc.): Pt on 7L O2. Amb on 6L o2 with SpO2 91%      Pertinent Vitals/Pain Pain Assessment: No/denies pain    Home Living                      Prior Function            PT Goals (current goals can now be found in the care plan section) Acute Rehab PT Goals Patient Stated Goal: to go home Progress towards PT goals: Progressing toward goals    Frequency    Min 3X/week  PT Plan Current plan remains appropriate    Co-evaluation              AM-PAC PT "6 Clicks" Mobility   Outcome Measure  Help needed turning from your back to your side while in a flat bed without using bedrails?: None Help needed moving from lying on your back to sitting on the side of a flat bed without using bedrails?: None Help needed moving to and from a bed to a chair (including a wheelchair)?: A Little Help needed standing up from a chair using your arms (e.g., wheelchair or bedside chair)?: A Little Help needed to walk in hospital room?: A Little Help needed climbing 3-5 steps with a railing? : A Lot 6 Click Score: 19    End of  Session Equipment Utilized During Treatment: Oxygen Activity Tolerance: Patient limited by fatigue Patient left: in chair;with call bell/phone within reach Nurse Communication: Mobility status PT Visit Diagnosis: Unsteadiness on feet (R26.81);Other abnormalities of gait and mobility (R26.89);Muscle weakness (generalized) (M62.81);Difficulty in walking, not elsewhere classified (R26.2)     Time: 9833-8250 PT Time Calculation (min) (ACUTE ONLY): 31 min  Charges:  $Gait Training: 23-37 mins                     [facility] PT Acute Rehabilitation Services Pager [phone] Office [phone]    Angelina Ok [facility] 07/17/2021, 12:50 PM

## 2021-07-17 NOTE — Plan of Care (Signed)
Chest xray obtained today. Pt diuresed with IV lasix. LUE >RUE swelling. Md aware. Insulin coverage per sliding scale. Morphine for back pain given once. UO for dayshift ~ .    Problem: Education: Goal: Knowledge of General Education information will improve Description: Including pain rating scale, medication(s)/side effects and non-pharmacologic comfort measures Outcome: Progressing   Problem: Health Behavior/Discharge Planning: Goal: Ability to manage health-related needs will improve Outcome: Progressing   Problem: Clinical Measurements: Goal: Ability to maintain clinical measurements within normal limits will improve Outcome: Progressing Goal: Will remain free from infection Outcome: Progressing

## 2021-07-18 ENCOUNTER — Inpatient Hospital Stay (HOSPITAL_COMMUNITY): Payer: Medicaid Other

## 2021-07-18 DIAGNOSIS — J9601 Acute respiratory failure with hypoxia: Secondary | ICD-10-CM | POA: Diagnosis not present

## 2021-07-18 DIAGNOSIS — D649 Anemia, unspecified: Secondary | ICD-10-CM | POA: Diagnosis not present

## 2021-07-18 DIAGNOSIS — I10 Essential (primary) hypertension: Secondary | ICD-10-CM | POA: Diagnosis not present

## 2021-07-18 DIAGNOSIS — M7989 Other specified soft tissue disorders: Secondary | ICD-10-CM | POA: Diagnosis not present

## 2021-07-18 DIAGNOSIS — G4733 Obstructive sleep apnea (adult) (pediatric): Secondary | ICD-10-CM | POA: Diagnosis not present

## 2021-07-18 DIAGNOSIS — I25119 Atherosclerotic heart disease of native coronary artery with unspecified angina pectoris: Secondary | ICD-10-CM | POA: Diagnosis not present

## 2021-07-18 DIAGNOSIS — I5031 Acute diastolic (congestive) heart failure: Secondary | ICD-10-CM | POA: Diagnosis not present

## 2021-07-18 DIAGNOSIS — J189 Pneumonia, unspecified organism: Secondary | ICD-10-CM | POA: Diagnosis not present

## 2021-07-18 DIAGNOSIS — E782 Mixed hyperlipidemia: Secondary | ICD-10-CM

## 2021-07-18 DIAGNOSIS — E1140 Type 2 diabetes mellitus with diabetic neuropathy, unspecified: Secondary | ICD-10-CM | POA: Diagnosis not present

## 2021-07-18 DIAGNOSIS — Z794 Long term (current) use of insulin: Secondary | ICD-10-CM | POA: Diagnosis not present

## 2021-07-18 LAB — CBC WITH DIFFERENTIAL/PLATELET
Abs Immature Granulocytes: 0.05 10*3/uL (ref 0.00–0.07)
Basophils Absolute: 0 10*3/uL (ref 0.0–0.1)
Basophils Relative: 1 %
Eosinophils Absolute: 0.2 10*3/uL (ref 0.0–0.5)
Eosinophils Relative: 3 %
HCT: 27.6 % — ABNORMAL LOW (ref 36.0–46.0)
Hemoglobin: 8.6 g/dL — ABNORMAL LOW (ref 12.0–15.0)
Immature Granulocytes: 1 %
Lymphocytes Relative: 22 %
Lymphs Abs: 1.5 10*3/uL (ref 0.7–4.0)
MCH: 26.6 pg (ref 26.0–34.0)
MCHC: 31.2 g/dL (ref 30.0–36.0)
MCV: 85.4 fL (ref 80.0–100.0)
Monocytes Absolute: 0.8 10*3/uL (ref 0.1–1.0)
Monocytes Relative: 11 %
Neutro Abs: 4.4 10*3/uL (ref 1.7–7.7)
Neutrophils Relative %: 62 %
Platelets: 314 10*3/uL (ref 150–400)
RBC: 3.23 MIL/uL — ABNORMAL LOW (ref 3.87–5.11)
RDW: 14.3 % (ref 11.5–15.5)
WBC: 6.9 10*3/uL (ref 4.0–10.5)
nRBC: 0 % (ref 0.0–0.2)

## 2021-07-18 LAB — BASIC METABOLIC PANEL
Anion gap: 6 (ref 5–15)
BUN: 45 mg/dL — ABNORMAL HIGH (ref 6–20)
CO2: 28 mmol/L (ref 22–32)
Calcium: 8.5 mg/dL — ABNORMAL LOW (ref 8.9–10.3)
Chloride: 95 mmol/L — ABNORMAL LOW (ref 98–111)
Creatinine, Ser: 1.34 mg/dL — ABNORMAL HIGH (ref 0.44–1.00)
GFR, Estimated: 52 mL/min — ABNORMAL LOW (ref >60)
Glucose, Bld: 196 mg/dL — ABNORMAL HIGH (ref 70–99)
Potassium: 4.9 mmol/L (ref 3.5–5.1)
Sodium: 129 mmol/L — ABNORMAL LOW (ref 135–145)

## 2021-07-18 LAB — GLUCOSE, CAPILLARY
Glucose-Capillary: 188 mg/dL — ABNORMAL HIGH (ref 70–99)
Glucose-Capillary: 199 mg/dL — ABNORMAL HIGH (ref 70–99)
Glucose-Capillary: 220 mg/dL — ABNORMAL HIGH (ref 70–99)
Glucose-Capillary: 165 mg/dL — ABNORMAL HIGH (ref 70–99)

## 2021-07-18 MED ORDER — FUROSEMIDE 10 MG/ML IJ SOLN: 20.0000 mg | Freq: Two times a day (BID) | INTRAMUSCULAR | Status: DC

## 2021-07-18 MED ORDER — FUROSEMIDE 10 MG/ML IJ SOLN
40.0000 mg | Freq: Two times a day (BID) | INTRAMUSCULAR | Status: DC
  Administered 2021-07-18: 40 mg via INTRAVENOUS
  Filled 2021-07-18: qty 4

## 2021-07-18 MED ORDER — INSULIN GLARGINE-YFGN 100 UNIT/ML ~~LOC~~ SOLN
20.0000 [IU] | Freq: Every day | SUBCUTANEOUS | Status: DC
  Administered 2021-07-18 – 2021-07-22 (×5): 20 [IU] via SUBCUTANEOUS
  Filled 2021-07-18 (×6): qty 0.2

## 2021-07-18 NOTE — Progress Notes (Signed)
approved or cleared by the [place] FDA and has been authorized for detection and/or diagnosis of SARS-CoV-2 by FDA under an Emergency Use Authorization (EUA). This EUA will remain in effect (meaning this test can be used) for the duration of the COVID-19 declaration under Section 564(b)(1) of the Act, 21 U.S.C. section 360bbb-3(b)(1), unless the authorization is terminated or revoked.  Performed at [facility], [address] [place], [place] [address]   Blood Culture (routine x 2)     Status: None   Collection Time: 07/10/21 11:04 PM   Specimen: BLOOD  Result Value Ref Range Status   Specimen Description BLOOD RIGHT ANTECUBITAL  Final   Special Requests   Final    BOTTLES DRAWN AEROBIC AND ANAEROBIC Blood Culture results may not be optimal due to an excessive volume of blood received in culture bottles   Culture   Final    NO GROWTH 5 DAYS Performed at [facility], [address] [place], [place] [address]    Report Status 07/16/2021 FINAL  Final  Urine Culture     Status: Abnormal   Collection Time: 07/11/21  1:17 AM   Specimen: Urine, Random  Result Value Ref Range Status   Specimen Description URINE, RANDOM  Final   Special Requests NONE  Final   Culture (A)  Final    <10,000 COLONIES/mL INSIGNIFICANT GROWTH Performed at [facility], [address] [place], [place] [address]    Report Status 07/12/2021 FINAL  Final  MRSA Next Gen by PCR, Nasal     Status: Abnormal   Collection Time: 07/11/21  6:01 PM   Specimen: Nasal Mucosa; Nasal Swab  Result Value Ref Range Status   MRSA by PCR Next Gen DETECTED (A) NOT DETECTED Final    Comment: RESULT CALLED TO, READ BACK BY AND VERIFIED WITH: [facility] [id number] 07/11/21 A BROWNING (NOTE) The GeneXpert MRSA Assay (FDA approved for NASAL specimens only), is one component of a comprehensive MRSA colonization  surveillance program. It is not intended to diagnose MRSA infection nor to guide or monitor treatment for MRSA infections. Test performance is not FDA approved in patients less than 40 years old. Performed at [facility], [address] [place], [place] [address]           Radiology Studies: DG Chest 2 View  Result Date: 07/17/2021 CLINICAL DATA:  40 year old female with fever. EXAM: CHEST - 2 VIEW COMPARISON:  Chest radiograph dated 07/15/2021. FINDINGS: Faintly seen left-sided PICC with tip likely in the central SVC. The tip of the PICC appears curved up. Stable cardiomegaly. Diffuse interstitial and vascular prominence, likely edema. Overall similar or slightly progressed CHF compared to the prior radiograph. Superimposed pneumonia is not excluded. Clinical correlation is recommended. No acute osseous pathology. IMPRESSION: 1. Left-sided PICC with tip likely in the central SVC. 2. Cardiomegaly with findings of CHF similar or slightly progressed compared to the prior radiograph. Superimposed pneumonia is not excluded. Electronically Signed   By: Elgie CollardArash  Radparvar M.D.   On: 07/17/2021 18:55   VAS US UPPER EXTREMITY VENOUS DUPLEX  Result Date: 07/18/2021 UPPER VENOUS STUDY  Patient Name:  Lindsay [name]  Date of Exam:   07/18/2021 Medical Rec #: [id number]          Accession #:    [id number] Date of Birth: 08/06/1981          Patient Gender: F Patient Age:   [age]Y Exam Location:  PROGRESS NOTE    Lindsay [name]  IPJ:[id number] DOB: [birth year] DOA: 07/10/2021 PCP: Grayce Sessions, NP    Chief Complaint  Patient presents with   Shortness of Breath    Brief Narrative:  Lindsay [name] is a 40 y.o. female with medical history significant of acute renal failure in 2016, asthma, RUE cellulitis, history of CAD, history of NSTEMI in 2017/status post angioplasty with stent, insulin requiring type II DM, hyperlipidemia, hypertension who is brought to the emergency department via EMS due to progressively worsening dyspnea since 07/09/2021 evening that did not respond to her home albuterol, along with fever, was diagnosed with CAP along with acute on chronic diastolic CHF and admitted to the hospital. Assessment & Plan:   Principal Problem:   CAP (community acquired pneumonia) Active Problems:   Essential hypertension   DM neuropathy, type II diabetes mellitus (HCC)   Coronary artery disease involving native coronary artery of native heart with angina pectoris (HCC)   OSA (obstructive sleep apnea)   Hyperlipidemia   Normocytic anemia   Acute respiratory failure with hypoxia probably secondary to a combination of community-acquired pneumonia and acute on chronic diastolic heart here, in the setting of OHS and obstructive sleep apnea. Last echocardiogram showed preserved left ventricular ejection fraction.  Completed the course of IV antibiotics.  Continue with BiPAP at night, and during the day she is on [place] oxygen. She was on 7 lit of [place] oxygen, weaned her down to 4l it /min today.  CXR shows features consistent with CHF She will probably need oxygen on discharge.  VBG results PH-7.42, PCO2-43.2, PO2-147, HCO3-28.4.   Acute on chronic diastolic heart failure  Diuresing with IV lasix 20 MG daily, will probably need to increase IV lasix to 20 mg BID.   Monitor renal parameters while on lasix.  Continue with strict intake and output and daily weights.   Diuresed about 6.5 lit since admission.  Cardiology consulted for further recommendations.    CAD S/P PCI in 2021. Pt currently denies any chest pain.  Continue with aspirin, bb and statin.    Hypertension BP parameters are optimal.  Pt on metoprolol 100 mg BID, imdur 60 mg daily, Hydralazine 100 mg TID, amlodipine 10 mg daily.    Dyslipidemia:  Resume statin.    Morbid obesity .  Body mass index is 71.87 kg/m. Has underlying OSA and OHS.  Has CPAP at home, while in the hospital she is using BIPAP. Will probably need trilogy on discharge.    Mild AKI  Creatinine on admission was 0.88 , worsened to 1.4 and iplatueau at 1.3  Continue to monitor.  Avoid nephrotoxins.     Hyperkalemia:  Resolved.    Urinary retention:  S/p foley. Will probably need voiding trial after we change from IV lasix to oral lasix.  Continue with flomax.    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  CBG (last 3)  Recent Labs    07/17/21 1546 07/17/21 2130 07/18/21 0736  GLUCAP 255* 265* 188*   Hemoglobin A1c is 8.3% At home she is on Jardiance 10 mg QD, Humulin U-500 80 units QAM, 75 units with lunch and 75 units with supper, Victoza 1.8 mg QHS Currently she I on 40 units of novolog TID, added glargine 20 units daily and resume Jardiance.   Anemia of chronic disease:  Hemoglobin last year around 29. Admitted with a hemoglobin of 9.7, current at 8.4.  get stool for  approved or cleared by the [place] FDA and has been authorized for detection and/or diagnosis of SARS-CoV-2 by FDA under an Emergency Use Authorization (EUA). This EUA will remain in effect (meaning this test can be used) for the duration of the COVID-19 declaration under Section 564(b)(1) of the Act, 21 U.S.C. section 360bbb-3(b)(1), unless the authorization is terminated or revoked.  Performed at [facility], [address] [place], [place] [address]   Blood Culture (routine x 2)     Status: None   Collection Time: 07/10/21 11:04 PM   Specimen: BLOOD  Result Value Ref Range Status   Specimen Description BLOOD RIGHT ANTECUBITAL  Final   Special Requests   Final    BOTTLES DRAWN AEROBIC AND ANAEROBIC Blood Culture results may not be optimal due to an excessive volume of blood received in culture bottles   Culture   Final    NO GROWTH 5 DAYS Performed at [facility], [address] [place], [place] [address]    Report Status 07/16/2021 FINAL  Final  Urine Culture     Status: Abnormal   Collection Time: 07/11/21  1:17 AM   Specimen: Urine, Random  Result Value Ref Range Status   Specimen Description URINE, RANDOM  Final   Special Requests NONE  Final   Culture (A)  Final    <10,000 COLONIES/mL INSIGNIFICANT GROWTH Performed at [facility], [address] [place], [place] [address]    Report Status 07/12/2021 FINAL  Final  MRSA Next Gen by PCR, Nasal     Status: Abnormal   Collection Time: 07/11/21  6:01 PM   Specimen: Nasal Mucosa; Nasal Swab  Result Value Ref Range Status   MRSA by PCR Next Gen DETECTED (A) NOT DETECTED Final    Comment: RESULT CALLED TO, READ BACK BY AND VERIFIED WITH: E [facility] RN [id number] 07/11/21 A BROWNING (NOTE) The GeneXpert MRSA Assay (FDA approved for NASAL specimens only), is one component of a comprehensive MRSA colonization  surveillance program. It is not intended to diagnose MRSA infection nor to guide or monitor treatment for MRSA infections. Test performance is not FDA approved in patients less than 40 years old. Performed at [facility], [address] [place], [place] [address]           Radiology Studies: DG Chest 2 View  Result Date: 07/17/2021 CLINICAL DATA:  40 year old female with fever. EXAM: CHEST - 2 VIEW COMPARISON:  Chest radiograph dated 07/15/2021. FINDINGS: Faintly seen left-sided PICC with tip likely in the central SVC. The tip of the PICC appears curved up. Stable cardiomegaly. Diffuse interstitial and vascular prominence, likely edema. Overall similar or slightly progressed CHF compared to the prior radiograph. Superimposed pneumonia is not excluded. Clinical correlation is recommended. No acute osseous pathology. IMPRESSION: 1. Left-sided PICC with tip likely in the central SVC. 2. Cardiomegaly with findings of CHF similar or slightly progressed compared to the prior radiograph. Superimposed pneumonia is not excluded. Electronically Signed   By: Elgie CollardArash  Radparvar M.D.   On: 07/17/2021 18:55   VAS US UPPER EXTREMITY VENOUS DUPLEX  Result Date: 07/18/2021 UPPER VENOUS STUDY  Patient Name:  Lindsay [name]  Date of Exam:   07/18/2021 Medical Rec #: [id number]          Accession #:    [id number] Date of Birth: 08/06/1981          Patient Gender: F Patient Age:   [age]Y Exam Location:  PROGRESS NOTE    Lindsay [name]  IPJ:[id number] DOB: [birth year] DOA: 07/10/2021 PCP: Grayce Sessions, NP    Chief Complaint  Patient presents with   Shortness of Breath    Brief Narrative:  Lindsay [name] is a 40 y.o. female with medical history significant of acute renal failure in 2016, asthma, RUE cellulitis, history of CAD, history of NSTEMI in 2017/status post angioplasty with stent, insulin requiring type II DM, hyperlipidemia, hypertension who is brought to the emergency department via EMS due to progressively worsening dyspnea since 07/09/2021 evening that did not respond to her home albuterol, along with fever, was diagnosed with CAP along with acute on chronic diastolic CHF and admitted to the hospital. Assessment & Plan:   Principal Problem:   CAP (community acquired pneumonia) Active Problems:   Essential hypertension   DM neuropathy, type II diabetes mellitus (HCC)   Coronary artery disease involving native coronary artery of native heart with angina pectoris (HCC)   OSA (obstructive sleep apnea)   Hyperlipidemia   Normocytic anemia   Acute respiratory failure with hypoxia probably secondary to a combination of community-acquired pneumonia and acute on chronic diastolic heart here, in the setting of OHS and obstructive sleep apnea. Last echocardiogram showed preserved left ventricular ejection fraction.  Completed the course of IV antibiotics.  Continue with BiPAP at night, and during the day she is on [place] oxygen. She was on 7 lit of [place] oxygen, weaned her down to 4l it /min today.  CXR shows features consistent with CHF She will probably need oxygen on discharge.  VBG results PH-7.42, PCO2-43.2, PO2-147, HCO3-28.4.   Acute on chronic diastolic heart failure  Diuresing with IV lasix 20 MG daily, will probably need to increase IV lasix to 20 mg BID.   Monitor renal parameters while on lasix.  Continue with strict intake and output and daily weights.   Diuresed about 6.5 lit since admission.  Cardiology consulted for further recommendations.    CAD S/P PCI in 2021. Pt currently denies any chest pain.  Continue with aspirin, bb and statin.    Hypertension BP parameters are optimal.  Pt on metoprolol 100 mg BID, imdur 60 mg daily, Hydralazine 100 mg TID, amlodipine 10 mg daily.    Dyslipidemia:  Resume statin.    Morbid obesity .  Body mass index is 71.87 kg/m. Has underlying OSA and OHS.  Has CPAP at home, while in the hospital she is using BIPAP. Will probably need trilogy on discharge.    Mild AKI  Creatinine on admission was 0.88 , worsened to 1.4 and iplatueau at 1.3  Continue to monitor.  Avoid nephrotoxins.     Hyperkalemia:  Resolved.    Urinary retention:  S/p foley. Will probably need voiding trial after we change from IV lasix to oral lasix.  Continue with flomax.    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  CBG (last 3)  Recent Labs    07/17/21 1546 07/17/21 2130 07/18/21 0736  GLUCAP 255* 265* 188*   Hemoglobin A1c is 8.3% At home she is on Jardiance 10 mg QD, Humulin U-500 80 units QAM, 75 units with lunch and 75 units with supper, Victoza 1.8 mg QHS Currently she I on 40 units of novolog TID, added glargine 20 units daily and resume Jardiance.   Anemia of chronic disease:  Hemoglobin last year around 29. Admitted with a hemoglobin of 9.7, current at 8.4.  get stool for  PROGRESS NOTE    Lindsay [name]  IPJ:[id number] DOB: [birth year] DOA: 07/10/2021 PCP: Grayce Sessions, NP    Chief Complaint  Patient presents with   Shortness of Breath    Brief Narrative:  Lindsay [name] is a 40 y.o. female with medical history significant of acute renal failure in 2016, asthma, RUE cellulitis, history of CAD, history of NSTEMI in 2017/status post angioplasty with stent, insulin requiring type II DM, hyperlipidemia, hypertension who is brought to the emergency department via EMS due to progressively worsening dyspnea since 07/09/2021 evening that did not respond to her home albuterol, along with fever, was diagnosed with CAP along with acute on chronic diastolic CHF and admitted to the hospital. Assessment & Plan:   Principal Problem:   CAP (community acquired pneumonia) Active Problems:   Essential hypertension   DM neuropathy, type II diabetes mellitus (HCC)   Coronary artery disease involving native coronary artery of native heart with angina pectoris (HCC)   OSA (obstructive sleep apnea)   Hyperlipidemia   Normocytic anemia   Acute respiratory failure with hypoxia probably secondary to a combination of community-acquired pneumonia and acute on chronic diastolic heart here, in the setting of OHS and obstructive sleep apnea. Last echocardiogram showed preserved left ventricular ejection fraction.  Completed the course of IV antibiotics.  Continue with BiPAP at night, and during the day she is on [place] oxygen. She was on 7 lit of [place] oxygen, weaned her down to 4l it /min today.  CXR shows features consistent with CHF She will probably need oxygen on discharge.  VBG results PH-7.42, PCO2-43.2, PO2-147, HCO3-28.4.   Acute on chronic diastolic heart failure  Diuresing with IV lasix 20 MG daily, will probably need to increase IV lasix to 20 mg BID.   Monitor renal parameters while on lasix.  Continue with strict intake and output and daily weights.   Diuresed about 6.5 lit since admission.  Cardiology consulted for further recommendations.    CAD S/P PCI in 2021. Pt currently denies any chest pain.  Continue with aspirin, bb and statin.    Hypertension BP parameters are optimal.  Pt on metoprolol 100 mg BID, imdur 60 mg daily, Hydralazine 100 mg TID, amlodipine 10 mg daily.    Dyslipidemia:  Resume statin.    Morbid obesity .  Body mass index is 71.87 kg/m. Has underlying OSA and OHS.  Has CPAP at home, while in the hospital she is using BIPAP. Will probably need trilogy on discharge.    Mild AKI  Creatinine on admission was 0.88 , worsened to 1.4 and iplatueau at 1.3  Continue to monitor.  Avoid nephrotoxins.     Hyperkalemia:  Resolved.    Urinary retention:  S/p foley. Will probably need voiding trial after we change from IV lasix to oral lasix.  Continue with flomax.    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  CBG (last 3)  Recent Labs    07/17/21 1546 07/17/21 2130 07/18/21 0736  GLUCAP 255* 265* 188*   Hemoglobin A1c is 8.3% At home she is on Jardiance 10 mg QD, Humulin U-500 80 units QAM, 75 units with lunch and 75 units with supper, Victoza 1.8 mg QHS Currently she I on 40 units of novolog TID, added glargine 20 units daily and resume Jardiance.   Anemia of chronic disease:  Hemoglobin last year around 29. Admitted with a hemoglobin of 9.7, current at 8.4.  get stool for  PROGRESS NOTE    Lindsay [name]  IPJ:[id number] DOB: [birth year] DOA: 07/10/2021 PCP: Grayce Sessions, NP    Chief Complaint  Patient presents with   Shortness of Breath    Brief Narrative:  Lindsay [name] is a 40 y.o. female with medical history significant of acute renal failure in 2016, asthma, RUE cellulitis, history of CAD, history of NSTEMI in 2017/status post angioplasty with stent, insulin requiring type II DM, hyperlipidemia, hypertension who is brought to the emergency department via EMS due to progressively worsening dyspnea since 07/09/2021 evening that did not respond to her home albuterol, along with fever, was diagnosed with CAP along with acute on chronic diastolic CHF and admitted to the hospital. Assessment & Plan:   Principal Problem:   CAP (community acquired pneumonia) Active Problems:   Essential hypertension   DM neuropathy, type II diabetes mellitus (HCC)   Coronary artery disease involving native coronary artery of native heart with angina pectoris (HCC)   OSA (obstructive sleep apnea)   Hyperlipidemia   Normocytic anemia   Acute respiratory failure with hypoxia probably secondary to a combination of community-acquired pneumonia and acute on chronic diastolic heart here, in the setting of OHS and obstructive sleep apnea. Last echocardiogram showed preserved left ventricular ejection fraction.  Completed the course of IV antibiotics.  Continue with BiPAP at night, and during the day she is on [place] oxygen. She was on 7 lit of [place] oxygen, weaned her down to 4l it /min today.  CXR shows features consistent with CHF She will probably need oxygen on discharge.  VBG results PH-7.42, PCO2-43.2, PO2-147, HCO3-28.4.   Acute on chronic diastolic heart failure  Diuresing with IV lasix 20 MG daily, will probably need to increase IV lasix to 20 mg BID.   Monitor renal parameters while on lasix.  Continue with strict intake and output and daily weights.   Diuresed about 6.5 lit since admission.  Cardiology consulted for further recommendations.    CAD S/P PCI in 2021. Pt currently denies any chest pain.  Continue with aspirin, bb and statin.    Hypertension BP parameters are optimal.  Pt on metoprolol 100 mg BID, imdur 60 mg daily, Hydralazine 100 mg TID, amlodipine 10 mg daily.    Dyslipidemia:  Resume statin.    Morbid obesity .  Body mass index is 71.87 kg/m. Has underlying OSA and OHS.  Has CPAP at home, while in the hospital she is using BIPAP. Will probably need trilogy on discharge.    Mild AKI  Creatinine on admission was 0.88 , worsened to 1.4 and iplatueau at 1.3  Continue to monitor.  Avoid nephrotoxins.     Hyperkalemia:  Resolved.    Urinary retention:  S/p foley. Will probably need voiding trial after we change from IV lasix to oral lasix.  Continue with flomax.    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  CBG (last 3)  Recent Labs    07/17/21 1546 07/17/21 2130 07/18/21 0736  GLUCAP 255* 265* 188*   Hemoglobin A1c is 8.3% At home she is on Jardiance 10 mg QD, Humulin U-500 80 units QAM, 75 units with lunch and 75 units with supper, Victoza 1.8 mg QHS Currently she I on 40 units of novolog TID, added glargine 20 units daily and resume Jardiance.   Anemia of chronic disease:  Hemoglobin last year around 29. Admitted with a hemoglobin of 9.7, current at 8.4.  get stool for

## 2021-07-18 NOTE — Progress Notes (Signed)
Occupational Therapy Treatment Patient Details Name: Lindsay [name] MRN: [id number] DOB: 1981-10-26 Today's Date: 07/18/2021    History of present illness Pt is a 40 y.o. female who presented 07/10/21 with fever and progressively worsening dyspnea since 07/09/21 that did not respond to her home albuterol. Pt admitted with community acquired pneumonia along with acute on chronic diastolic CHF. PMH: acute renal failure in 2016, asthma, RUE cellulitis, history of CAD, history of NSTEMI in 2017/status post angioplasty with stent, insulin requiring type II DM, hyperlipidemia, hypertension , obesity   OT comments  Pt making incremental progress with OT goals this session. Pt continues to be limited by activity tolerance, however her O2 needs have lessened. At this time, pt requires min guard for transfers and ambulation, using a RW. Pt continues to have difficulty with LB ADL's due to swelling and SOB when bending forward. Otherwise pt able to complete all other ADL's seated or standing for short periods of time. OT will continue to follow acutely.    Follow Up Recommendations  No OT follow up    Equipment Recommendations  Tub/shower seat;Other (comment)    Recommendations for Other Services      Precautions / Restrictions Precautions Precautions: Fall Precaution Comments: monitor SpO2 Restrictions Weight Bearing Restrictions: No       Mobility Bed Mobility Overal bed mobility: Modified Independent             General bed mobility comments: HOB elevated and use of rail and incr time    Transfers Overall transfer level: Needs assistance Equipment used: Rolling walker (2 wheeled) Transfers: Sit to/from Stand Sit to Stand: Min guard;From elevated surface         General transfer comment: Min G for safety    Balance Overall balance assessment: Needs assistance Sitting-balance support: No upper extremity supported;Feet supported Sitting balance-Leahy Scale: Fair      Standing balance support: Bilateral upper extremity supported;During functional activity Standing balance-Leahy Scale: Poor Standing balance comment: walker and supervision for static standing                           ADL either performed or assessed with clinical judgement   ADL Overall ADL's : Needs assistance/impaired     Grooming: Min guard;Standing;Wash/dry hands Grooming Details (indicate cue type and reason): at sink                 Toilet Transfer: Supervision/safety;Stand-pivot Statistician Details (indicate cue type and reason): needs increased time and use of momentum to push up from chair to power up to standing no physical assistance         Functional mobility during ADLs: Supervision/safety;Rolling walker General ADL Comments: patient primarily limited by decreased activity tolerance     Vision       Perception     Praxis      Cognition Arousal/Alertness: Awake/alert Behavior During Therapy: WFL for tasks assessed/performed Overall Cognitive Status: Within Functional Limits for tasks assessed                                          Exercises     Shoulder Instructions       General Comments Pt on 3L, trialed 2L sitting EOB, pt maintained 97%, once standing, pt dropped to 88%, increased back to 3L, pt returned to 93% with ambulation and 97%  once returned to supine.    Pertinent Vitals/ Pain       Pain Assessment: No/denies pain  Home Living                                          Prior Functioning/Environment              Frequency  Min 2X/week        Progress Toward Goals  OT Goals(current goals can now be found in the care plan section)  Progress towards OT goals: Progressing toward goals  Acute Rehab OT Goals Patient Stated Goal: to go home OT Goal Formulation: With patient Time For Goal Achievement: 07/30/21 Potential to Achieve Goals: Good ADL Goals Pt Will  Perform Lower Body Dressing: with modified independence;sit to/from stand;sitting/lateral leans Pt Will Transfer to Toilet: with modified independence;ambulating Pt Will Perform Toileting - Clothing Manipulation and hygiene: with modified independence;sit to/from stand;sitting/lateral leans;with adaptive equipment Additional ADL Goal #1: Patient will verbalize and demonstrate as needed understanding of energy conservation strategies in order to safely participate in daily care routine.  Plan Discharge plan remains appropriate;Frequency remains appropriate    Co-evaluation                 AM-PAC OT "6 Clicks" Daily Activity     Outcome Measure   Help from another person eating meals?: None Help from another person taking care of personal grooming?: A Little Help from another person toileting, which includes using toliet, bedpan, or urinal?: A Lot Help from another person bathing (including washing, rinsing, drying)?: A Little Help from another person to put on and taking off regular upper body clothing?: A Little Help from another person to put on and taking off regular lower body clothing?: A Little 6 Click Score: 18    End of Session Equipment Utilized During Treatment: Oxygen;Rolling walker  OT Visit Diagnosis: Unsteadiness on feet (R26.81);Other abnormalities of gait and mobility (R26.89);Muscle weakness (generalized) (M62.81)   Activity Tolerance Patient tolerated treatment well   Patient Left in bed;with call bell/phone within reach   Nurse Communication Mobility status        Time: 7829-5621 OT Time Calculation (min): 32 min  Charges: OT General Charges $OT Visit: 1 Visit OT Treatments $Self Care/Home Management : 8-22 mins $Therapeutic Activity: 8-22 mins  Jonesha Tsuchiya H., OTR/L Acute Rehabilitation   Reyana Leisey Elane Bing Plume 07/18/2021, 12:32 PM

## 2021-07-18 NOTE — Progress Notes (Signed)
Left upper extremity venous duplex has been completed. Preliminary results can be found in CV Proc through chart review.   07/18/21 9:47 AM Olen Cordial RVT

## 2021-07-18 NOTE — Consult Note (Addendum)
The patient has been seen in conjunction with Almyra Deforest, PA-C. All aspects of care have been considered and discussed. The patient has been personally interviewed, examined, and all clinical data has been reviewed.  I/O seems to be inaccurate. The weight is up nearly 21 pounds since admission. Neck veins are not visible due to body habitus. There is hands, arms, and lower extremity edema. BNP has been gradually rising although not terribly high. Metabolic panel suggests rising BUN and creatinine.  She has prior history of acute kidney injury. Recommendation: Further increase diuretic therapy and follow kidney function "very closely", discontinuing if the BUN/creatinine ratio continues to increase in the creatinine does not improve with diuresis. She would be very difficult to perform right heart cath on.  This clinical trial to remove volume seems the most prudent initial approach. Follow daily basic metabolic panel.  Nurse should call before a.m. dose of furosemide with recommendations about whether to give or not. States she was not given CPAP/BiPAP last night and did not get much sleep at all.  It is imperative that she receives ongoing treatment for sleep apnea.    Cardiology Consultation:   Patient ID: Lindsay [name] MRN: [id number]; DOB: [birth year]  Admit date: 07/10/2021 Date of Consult: 07/18/2021  PCP:  [name], NP   [facility] Providers Cardiologist:  Sinclair Grooms, MD        Patient Profile:   Lindsay [name] is a 40 y.o. female with a hx of CAD, ischemic cardiomyopathy with improved EF, hypertension, hyperlipidemia, DM2 and OSA on CPAP who is being seen 07/18/2021 for the evaluation of volume overload at the request of Dr. Karleen Hampshire.  History of Present Illness:   Ms. Traughber is a morbidly obese 40 year old female with past medical history of CAD, ischemic cardiomyopathy with improved EF, hypertension, hyperlipidemia, DM2 and OSA on CPAP.  Patient had  NSTEMI in September 2017 and received DES to LAD and POBA of D2, LV gram revealed EF 35 to 45%.  Subsequent echocardiogram obtained in September 2017 showed EF 55 to 60%, anteroseptal, anterior, and apical hypokinesis.  Due to recurrent symptoms, patient underwent another cardiac catheterization in May 2021 that showed widely patent LAD stent with only 20% in-stent restenosis, 60% D1 lesion, jailed D2 ostium with 50% lesion, 30% proximal left circumflex artery, 50% OM1 lesion, EF 55 to 65%.  Medical management was recommended.  She was last seen by Richardson Dopp PA-C in May 2021 at which time she was doing well.  Her weight at the time was 412 pounds.  She was seen by her OB/GYN Dr. On 06/03/2021, weight was 415 pounds at the time.  She returned back to the hospital on 07/10/2021 with complaint of 1 day onset of worsening shortness of breath, coughing, fever and chills.  When EMS arrived on the scene, she was found to have O2 saturation in the 90% range.  On arrival to the ED, she was hypertensive with blood pressure 159/71.  Tachycardic with a heart rate of 104.  Tachypneic at 29 breaths/min.  Temperature was elevated at 101.1.  Her weight was 437 pounds.  This x-ray revealed interval development of diffuse interstitial infiltrate concerning for atypical infection versus edema.  BNP was borderline at 103.2.  Serial troponin was 18--12.  Red blood cell count 9.7.  White blood cell count 10.9.  COVID test and influenza panel negative.  Pregnancy test negative.  Blood culture was drawn.  Patient was started on antibiotic for  possible pneumonia.  She was also felt to be volume overloaded and underwent IV diuresis.  Lower and upper extremity venous Doppler were negative for DVT.  She was initially placed on BiPAP therapy however this was weaned down to high flow nasal cannula.  During hospitalization, she also noted to have hyponatremia as well.  Repeat echocardiogram obtained on 07/12/2021 showed EF 65 to 70%, mild LVH,  RVSP 18.7 mmHg, normal RV function was normal pulmonary artery systolic pressure, trivial MR.  She is currently receiving 20 mg daily of IV Lasix.  She has put out about 4.6 L of fluid since admission. I/O likely not accurate.  Weight is actually going up from the 437.5 pounds on arrival to 458.9 pounds today.  Cardiology service consulted for possible heart failure.   Past Medical History:  Diagnosis Date   ARF (acute renal failure) ([place]) 04/2015   Asthma    Cellulitis of right upper extremity    Coronary artery disease    Diabetes mellitus    insulin dependent   Hyperlipidemia LDL goal <70    Hypertension    NSTEMI (non-ST elevated myocardial infarction) ([place]) 08/2016   Obesity    S/P angioplasty with stent 08/2016   DES to mLAD and PTCA only to 2nd diag ostium.     Past Surgical History:  Procedure Laterality Date   CARDIAC CATHETERIZATION N/A 09/07/2016   Procedure: Left Heart Cath and Coronary Angiography;  Surgeon: [name], MD;  Location: [place] [facility];  Service: Cardiovascular;  Laterality: N/A;   CARDIAC CATHETERIZATION N/A 09/07/2016   Procedure: Coronary Stent Intervention;  Surgeon: [name], MD;  Location: [place] [facility];  Service: Cardiovascular;  Laterality: N/A;   CARDIAC CATHETERIZATION N/A 09/07/2016   Procedure: Coronary Balloon Angioplasty;  Surgeon: [name], MD;  Location: [place] [facility];  Service: Cardiovascular;  Laterality: N/A;   CESAREAN SECTION     IRRIGATION AND DEBRIDEMENT SHOULDER Right 04/30/2015   Procedure: IRRIGATION AND DEBRIDEMENT SHOULDER;  Surgeon: Renette Butters, MD;  Location: [place];  Service: Orthopedics;  Laterality: Right;   IRRIGATION AND DEBRIDEMENT SHOULDER Right 05/01/2015   LEFT HEART CATH AND CORONARY ANGIOGRAPHY N/A 04/25/2020   Procedure: LEFT HEART CATH AND CORONARY ANGIOGRAPHY;  Surgeon: [name], MD;  Location: [place] [facility];  Service: Cardiovascular;  Laterality: N/A;   LEG SURGERY     SHOULDER  ARTHROSCOPY Right 04/30/2015   Procedure: ARTHROSCOPY SHOULDER;  Surgeon: Renette Butters, MD;  Location: [place];  Service: Orthopedics;  Laterality: Right;   TONSILLECTOMY       Home Medications:  Prior to Admission medications   Medication Sig Start Date End Date Taking? Authorizing Provider  Accu-Chek FastClix Lancets MISC USE 1 TO CHECK GLUCOSE 4 TIMES DAILY 01/26/20  Yes [name], [name], MD  albuterol (VENTOLIN HFA) 108 (90 Base) MCG/ACT inhaler INHALE 2 PUFFS BY MOUTH EVERY 6 HOURS AS NEEDED FOR WHEEZING FOR SHORTNESS OF BREATH Patient taking differently: Inhale 2 puffs into the lungs every 6 (six) hours as needed for wheezing or shortness of breath. 01/23/21  Yes Charlott Rakes, MD  amLODipine (NORVASC) 10 MG tablet Take 1 tablet (10 mg total) by mouth daily. 06/02/21  Yes [name], NP  amoxicillin (AMOXIL) 500 MG capsule Take 500 mg by mouth every 8 (eight) hours. 07/03/21  Yes [provider]  aspirin 81 MG tablet Take 1 tablet (81 mg total) by mouth daily. 01/13/18  Yes Domenica Fail  Shanon Brow, PA-C  aspirin-acetaminophen-caffeine (EXCEDRIN MIGRAINE) [phone] MG tablet Take 2 tablets by mouth every 6 (six) hours as needed for headache.   Yes [provider]  atorvastatin (LIPITOR) 40 MG tablet Take 1 tablet (40 mg total) by mouth daily. 12/06/20  Yes [name], NP  baclofen (LIORESAL) 10 MG tablet Take 0.5-1 tablets (5-10 mg total) by mouth 3 (three) times daily as needed for muscle spasms. 04/18/21  Yes Hilts, Legrand Como, MD  BD PEN NEEDLE NANO 2ND GEN 32G X 4 MM MISC  09/30/20  Yes [provider]  BD VEO INSULIN SYRINGE U/F 31G X 15/64" 1 ML MISC USE  SYRINGE ONCE DAILY 06/07/18  Yes Elayne Snare, MD  Blood Glucose Monitoring Suppl (ACCU-CHEK AVIVA PLUS) w/Device KIT 1 each by Does not apply route as directed. 04/15/18  Yes Elayne Snare, MD  celecoxib (CELEBREX) 100 MG capsule Take 1 capsule (100 mg total) by mouth 2 (two) times daily. 04/10/21  Yes  [name], NP  diphenhydrAMINE (BENADRYL) 25 MG tablet Take 50 mg by mouth in the morning and at bedtime. Allergies   Yes [provider]  diphenhydrAMINE (SOMINEX) 25 MG tablet Take by mouth.   Yes [provider]  DULoxetine (CYMBALTA) 30 MG capsule Take 1 capsule (30 mg total) by mouth daily. 06/02/21  Yes [name], NP  fluticasone (FLOVENT HFA) 110 MCG/ACT inhaler Inhale 2 puffs into the lungs 2 (two) times daily. 12/05/19  Yes [name], NP  gabapentin (NEURONTIN) 300 MG capsule TAKE 2 CAPSULES BY MOUTH THREE TIMES DAILY 02/11/21  Yes Juluis Mire P, NP  glucose blood (ACCU-CHEK AVIVA PLUS) test strip Use as instructed to test blood sugar 4 times daily. 01/11/19  Yes [name], NP  HUMULIN R U-500 KWIKPEN 500 UNIT/ML kwikpen Inject 75-80 Units into the skin 3 (three) times daily with meals. AM 80 units  Noon 75 units  Evening 75 units If BG above 200 add 5 units to prescribed dose 09/30/20  Yes [provider]  hydrochlorothiazide (HYDRODIURIL) 25 MG tablet Take 1 tablet (25 mg total) by mouth every morning. 12/05/19  Yes [name], NP  ibuprofen (ADVIL,MOTRIN) 200 MG tablet Take 400 mg by mouth every 6 (six) hours as needed for fever or moderate pain.   Yes [provider]  Insulin Pen Needle (B-D UF III MINI PEN NEEDLES) 31G X 5 MM MISC Use as instructed. Monitor blood glucose levels three times per day 04/13/19  Yes [name], NP  isosorbide mononitrate (IMDUR) 30 MG 24 hr tablet Take 0.5 tablets (15 mg total) by mouth daily. Please keep upcoming appt with Cardiologist in October 2022 before anymore refills. Thank you Final attempt 06/19/21  Yes [name], MD  liraglutide (VICTOZA) 18 MG/3ML SOPN Inject 1.8 mg into the skin at bedtime.  07/26/19  Yes [provider]  losartan (COZAAR) 100 MG tablet Take 1 tablet (100 mg total) by mouth daily. Please keep upcoming appt in October 2022 with  Cardiologist before anymore refills. Thank you Final Attempt 06/19/21  Yes [name], MD  metoprolol tartrate (LOPRESSOR) 100 MG tablet Take 1 tablet (100 mg total) by mouth 2 (two) times daily. Please keep upcoming appt in October 2022 with Cardiologist before anymore refills. Thank you Final Attempt 06/19/21  Yes [name], MD  metroNIDAZOLE (FLAGYL) 500 MG tablet Take 1 tablet (500 mg total) by mouth 2 (two) times daily. 06/05/21  Yes Baltazar Najjar  A, MD  Multiple Vitamins-Minerals (MULTIVITAMIN PO) Take 1 tablet by mouth daily.   Yes [provider]  nitroGLYCERIN (NITROSTAT) 0.4 MG SL tablet Place 1 tablet (0.4 mg total) under the tongue every 5 (five) minutes as needed for chest pain. Please keep upcoming appt in October 2022. Final Attempt 06/19/21  Yes [name], MD  empagliflozin (JARDIANCE) 10 MG TABS tablet Take 10 mg by mouth daily. Patient not taking: No sig reported 11/06/19   [name], NP  Melatonin 10 MG TABS Take 3 tablets by mouth at bedtime.    [provider]    Inpatient Medications: Scheduled Meds:  amLODipine  10 mg Oral Daily   aspirin  81 mg Oral Daily   atorvastatin  40 mg Oral Daily   budesonide (PULMICORT) nebulizer solution  0.25 mg Nebulization BID   Chlorhexidine Gluconate Cloth  6 each Topical Daily   DULoxetine  30 mg Oral q AM   empagliflozin  10 mg Oral Daily   enoxaparin (LOVENOX) injection  100 mg Subcutaneous Q24H   furosemide  20 mg Intravenous Daily   gabapentin  600 mg Oral TID   hydrALAZINE  100 mg Oral Q8H   insulin aspart  0-5 Units Subcutaneous QHS   insulin aspart  0-9 Units Subcutaneous TID WC   insulin aspart  40 Units Subcutaneous TID WC   ipratropium-albuterol  3 mL Nebulization BID   isosorbide mononitrate  60 mg Oral Daily   metoprolol tartrate  100 mg Oral BID   sodium chloride flush  10-40 mL Intracatheter Q12H   tamsulosin  0.4 mg Oral Daily   Continuous Infusions:  sodium chloride 10  mL/hr at 07/16/21 2153   PRN Meds: sodium chloride, albuterol, aspirin-acetaminophen-caffeine, baclofen, hydrALAZINE, morphine injection, nitroGLYCERIN, [DISCONTINUED] ondansetron **OR** ondansetron (ZOFRAN) IV, sodium chloride flush  Allergies:    Allergies  Allergen Reactions   Hydrazine Yellow [Tartrazine] Shortness Of Breath and Swelling    Swelling mostly noticed in legs and feet, retaining urination, shortness of breaht, and minor chest pain   Lisinopril Shortness Of Breath    Was on prinzide; had sob/chest pain on it.   Tylenol [Acetaminophen] Itching and Swelling    Itching of the mouth, swelling of tongue and stomach started hurting    Social History:   Social History   Socioeconomic History   Marital status: Single    Spouse name: Not on file   Number of children: Not on file   Years of education: Not on file   Highest education level: Not on file  Occupational History   Not on file  Tobacco Use   Smoking status: Never   Smokeless tobacco: Never  Vaping Use   Vaping Use: Never used  Substance and Sexual Activity   Alcohol use: Yes    Comment: socially   Drug use: No   Sexual activity: Not on file  Other Topics Concern   Not on file  Social History Narrative   Not on file   Social Determinants of Health   Financial Resource Strain: Not on file  Food Insecurity: Not on file  Transportation Needs: Not on file  Physical Activity: Not on file  Stress: Not on file  Social Connections: Not on file  Intimate Partner Violence: Not on file    Family History:    Family History  Problem Relation Age of Onset   Diabetes Mother    Hypertension Mother    Heart disease Mother    Diabetes  Father    Heart disease Father    Stroke Maternal Grandmother    Cancer Maternal Grandmother      ROS:  Please see the history of present illness.   All other ROS reviewed and negative.     Physical Exam/Data:   Vitals:   07/18/21 0552 07/18/21 0740 07/18/21 0745  07/18/21 0937  BP:      Pulse: 71     Resp: (!) 21     Temp:    98.6 F (37 C)  TempSrc:    Oral  SpO2: 95% 97% 97%   Weight:      Height:        Intake/Output Summary (Last 24 hours) at 07/18/2021 1020 Last data filed at 07/18/2021 0824 Gross per 24 hour  Intake --  Output 1450 ml  Net -1450 ml   Last 3 Weights 07/10/2021 06/03/2021 12/05/2020  Weight (lbs) 437 lb 8 oz 415 lb 11.2 oz 411 lb 9.6 oz  Weight (kg) 198.449 kg 188.56 kg 186.701 kg     Body mass index is 68.52 kg/m.  General:  Well nourished, well developed, in no acute distress HEENT: normal Lymph: no adenopathy Neck: no JVD Endocrine:  No thryomegaly Vascular: No carotid bruits; FA pulses 2+ bilaterally without bruits  Cardiac:  normal S1, S2; RRR; no murmur  Lungs:  clear to auscultation bilaterally, no wheezing, rhonchi or rales  Abd: soft, nontender, no hepatomegaly  Ext: 2+ edema Musculoskeletal:  No deformities, BUE and BLE strength normal and equal Skin: warm and dry  Neuro:  CNs 2-12 intact, no focal abnormalities noted Psych:  Normal affect   EKG:  The EKG was personally reviewed and demonstrates:  NSR without significant ST-T wave changes Telemetry:  Telemetry was personally reviewed and demonstrates:  NSR without significant ventricular ectopy  Relevant CV Studies:  Echo 07/12/2021  1. Left ventricular ejection fraction, by estimation, is 65 to 70%. The  left ventricle has normal function. Left ventricular endocardial border  not optimally defined to evaluate regional wall motion. There is mild left  ventricular hypertrophy. Left  ventricular diastolic parameters were normal.   2. Right ventricular systolic function is normal. The right ventricular  size is normal. There is normal pulmonary artery systolic pressure. The  estimated right ventricular systolic pressure is 94.4 mmHg.   3. The mitral valve is grossly normal. Trivial mitral valve  regurgitation.   4. The aortic valve is tricuspid.  Aortic valve regurgitation is not  visualized.   5. The inferior vena cava is normal in size with greater than 50%  respiratory variability, suggesting right atrial pressure of 3 mmHg.  Laboratory Data:  High Sensitivity Troponin:   Recent Labs  Lab 07/10/21 2300 07/11/21 0614 07/11/21 1656 07/11/21 2111 07/12/21 1723  TROPONINIHS 18* _0 Chemistry Recent Labs  Lab 07/16/21 0114 07/16/21 1602 07/17/21 0610 07/18/21 0740  NA 129* 132* 130* 129*  K 4.9 4.4 5.0 4.9  CL 95*  --  96* 95*  CO2 26  --  28 28  GLUCOSE 195*  --  150* 196*  BUN 38*  --  40* 45*  CREATININE 1.39*  --  1.32* 1.34*  CALCIUM 8.7*  --  8.5* 8.5*  GFRNONAA 50*  --  53* 52*  ANIONGAP 8  --  6 6    Recent Labs  Lab 07/15/21 0029 07/16/21 0114 07/17/21 0610  PROT 7.2 7.5 6.8  ALBUMIN 2.9* 2.9* 2.8*  AST _0 ALT _1 ALKPHOS 86 85 76  BILITOT 0.6 0.6 0.3   Hematology Recent Labs  Lab 07/16/21 0114 07/16/21 1602 07/17/21 0610 07/17/21 1746 07/18/21 0740  WBC 8.8  --  7.3  --  6.9  RBC 3.52*  --  3.17* 3.33* 3.23*  HGB 9.2* 8.8* 8.4*  --  8.6*  HCT 30.3* 26.0* 27.3*  --  27.6*  MCV 86.1  --  86.1  --  85.4  MCH 26.1  --  26.5  --  26.6  MCHC 30.4  --  30.8  --  31.2  RDW 14.3  --  14.3  --  14.3  PLT 295  --  302  --  314   BNP Recent Labs  Lab 07/15/21 0029 07/16/21 0114 07/17/21 0610  BNP 148.1* 179.8* 190.7*    DDimer  Recent Labs  Lab 07/13/21 1219 07/14/21 0214 07/15/21 0029  DDIMER 1.92* 1.50* 1.35*     Radiology/Studies:  DG Chest 2 View  Result Date: 07/17/2021 CLINICAL DATA:  40 year old female with fever. EXAM: CHEST - 2 VIEW COMPARISON:  Chest radiograph dated 07/15/2021. FINDINGS: Faintly seen left-sided PICC with tip likely in the central SVC. The tip of the PICC appears curved up. Stable cardiomegaly. Diffuse interstitial and vascular prominence, likely edema. Overall similar or slightly progressed CHF compared to the prior radiograph.  Superimposed pneumonia is not excluded. Clinical correlation is recommended. No acute osseous pathology. IMPRESSION: 1. Left-sided PICC with tip likely in the central SVC. 2. Cardiomegaly with findings of CHF similar or slightly progressed compared to the prior radiograph. Superimposed pneumonia is not excluded. Electronically Signed   By: Anner Crete M.D.   On: 07/17/2021 18:55   DG Chest Port 1 View  Result Date: 07/15/2021 CLINICAL DATA:  Shortness of breath. EXAM: PORTABLE CHEST 1 VIEW COMPARISON:  Chest x-ray 07/14/2021 FINDINGS: The heart is mildly enlarged but appears stable. Peribronchial thickening and increased interstitial markings could suggest mild interstitial edema or bronchitis. No pleural effusions or focal infiltrates. No pneumothorax. IMPRESSION: 1. Stable cardiac enlargement. 2. Peribronchial thickening and increased interstitial markings could suggest mild interstitial edema or bronchitis. Electronically Signed   By: Marijo Sanes M.D.   On: 07/15/2021 07:41   VAS [place] UPPER EXTREMITY VENOUS DUPLEX  Result Date: 07/18/2021 UPPER VENOUS STUDY  Patient Name:  RODNISHA BLOMGREN  Date of Exam:   07/18/2021 Medical Rec #: [id number]          Accession #:    [id number] Date of Birth: 1981-06-18          Patient Gender: F Patient Age:   53Y Exam Location:  [facility] Procedure:      VAS [place] UPPER EXTREMITY VENOUS DUPLEX Referring Phys: [id number] VIJAYA AKULA --------------------------------------------------------------------------------  Indications: Swelling Risk Factors: Surgery PICC line placement. Limitations: Body habitus and poor ultrasound/tissue interface. Comparison Study: No prior studies. Performing Technologist: Oliver Hum RVT  Examination Guidelines: A complete evaluation includes B-mode imaging, spectral Doppler, color Doppler, and power Doppler as needed of all accessible portions of each vessel. Bilateral testing is considered an integral part of a complete  examination. Limited examinations for reoccurring indications may be performed as noted.  Right Findings: +----------+------------+---------+-----------+----------+-------+ RIGHT     CompressiblePhasicitySpontaneousPropertiesSummary +----------+------------+---------+-----------+----------+-------+ Subclavian    Full       Yes       Yes                      +----------+------------+---------+-----------+----------+-------+  Left Findings: +----------+------------+---------+-----------+----------+-------+ LEFT      CompressiblePhasicitySpontaneousPropertiesSummary +----------+------------+---------+-----------+----------+-------+ IJV           Full       Yes       Yes                      +----------+------------+---------+-----------+----------+-------+ Subclavian    Full       Yes       Yes                      +----------+------------+---------+-----------+----------+-------+ Axillary      Full       Yes       Yes                      +----------+------------+---------+-----------+----------+-------+ Brachial      Full       Yes       Yes                      +----------+------------+---------+-----------+----------+-------+ Radial        Full                                          +----------+------------+---------+-----------+----------+-------+ Ulnar         Full                                          +----------+------------+---------+-----------+----------+-------+ Cephalic      Full                                          +----------+------------+---------+-----------+----------+-------+ Basilic       Full                                          +----------+------------+---------+-----------+----------+-------+  Summary:  Right: No evidence of thrombosis in the subclavian.  Left: No evidence of deep vein thrombosis in the upper extremity. No evidence of superficial vein thrombosis in the upper extremity.  *See table(s) above  for measurements and observations.    Preliminary    [place] EKG SITE RITE  Result Date: 07/16/2021 If Site Rite image not attached, placement could not be confirmed due to current cardiac rhythm.    Assessment and Plan:   Acute on chronic diastolic heart failure  -Initial BNP on arrival 7/21 was 103.2.  BNP dropped down to 31.2 by 7/24, however has trending up since.  This morning BNP was 190.7.  -Suspect I/O not accurate (Intake for the past 24 hours is only 10 mL, patient has 2 [place] and 2 cup of water next to her during our interview).  Her weight back in June was 415 pounds, she arrived on 7/21 with a weight of 437.5 pounds.  This morning her weight is 458.9 pounds.  -Unfortunately with her morbid obesity, it is next to impossible to assess volume status based on physical exam.  However I suspect she is volume overloaded based on above data.  I will increase her Lasix to 40 mg  twice daily.   Community-acquired pneumonia: Treated with antibiotic.  Managed by primary team.  CAD: Last cardiac catheterization in May 2021 showed stable coronary anatomy  Hyponatremia: Sodium level on arrival was 135.  This morning was 129.  I suspect patient is drinking free fluid on the side.  We will limit fluid intake to less than 1562m/day  Hypertension  Hyperlipidemia  DM2  Obstructive sleep apnea: On CPAP therapy  Morbid obesity: Weight loss imperative   Risk Assessment/Risk Scores:                For questions or updates, please contact CGlen AcresPlease consult [email or web address] for contact info under    Signed, HAlmyra Deforest PFairfield 07/18/2021 10:20 AM

## 2021-07-18 NOTE — Plan of Care (Signed)

## 2021-07-18 NOTE — Progress Notes (Signed)
Inpatient Diabetes Program Recommendations  AACE/ADA: New Consensus Statement on Inpatient Glycemic Control (2015)  Target Ranges:  Prepandial:   less than 140 mg/dL      Peak postprandial:   less than 180 mg/dL (1-2 hours)      Critically ill patients:  140 - 180 mg/dL   Lab Results  Component Value Date   GLUCAP 188 (H) 07/18/2021   HGBA1C 8.3 (H) 07/11/2021    Review of Glycemic Control Results for WALLIS, SPIZZIRRI (MRN [id number]) as of 07/18/2021 11:32  Ref. Range 07/17/2021 12:53 07/17/2021 15:46 07/17/2021 21:30 07/18/2021 07:36  Glucose-Capillary Latest Ref Range: 70 - 99 mg/dL 633 (H) 354 (H) 562 (H) 188 (H)   Diabetes history: DM2 Outpatient Diabetes medications: Jardiance 10 mg QD, Humulin U-500 80 units QAM, 75 units with lunch and 75 units with supper, Victoza 1.8 mg QHS Current orders for Inpatient glycemic control: Novolog 40 units TID, Novolog 0-9 units TID and 0-5 units QHS, Jardiance 10 mg QD   Inpatient Diabetes Program Recommendations:     Consider adding Lantus 20 units (0.1 units/kg) QD.  Thanks, Lujean Rave, MSN, RNC-OB Diabetes Coordinator [phone] (8a-5p)

## 2021-07-19 DIAGNOSIS — G4733 Obstructive sleep apnea (adult) (pediatric): Secondary | ICD-10-CM | POA: Diagnosis not present

## 2021-07-19 DIAGNOSIS — I10 Essential (primary) hypertension: Secondary | ICD-10-CM | POA: Diagnosis not present

## 2021-07-19 DIAGNOSIS — D649 Anemia, unspecified: Secondary | ICD-10-CM | POA: Diagnosis not present

## 2021-07-19 DIAGNOSIS — I5031 Acute diastolic (congestive) heart failure: Secondary | ICD-10-CM | POA: Diagnosis not present

## 2021-07-19 DIAGNOSIS — J9601 Acute respiratory failure with hypoxia: Secondary | ICD-10-CM | POA: Diagnosis not present

## 2021-07-19 DIAGNOSIS — I25119 Atherosclerotic heart disease of native coronary artery with unspecified angina pectoris: Secondary | ICD-10-CM | POA: Diagnosis not present

## 2021-07-19 DIAGNOSIS — Z794 Long term (current) use of insulin: Secondary | ICD-10-CM | POA: Diagnosis not present

## 2021-07-19 DIAGNOSIS — E1140 Type 2 diabetes mellitus with diabetic neuropathy, unspecified: Secondary | ICD-10-CM | POA: Diagnosis not present

## 2021-07-19 DIAGNOSIS — J189 Pneumonia, unspecified organism: Secondary | ICD-10-CM | POA: Diagnosis not present

## 2021-07-19 LAB — BASIC METABOLIC PANEL
Anion gap: 7 (ref 5–15)
BUN: 41 mg/dL — ABNORMAL HIGH (ref 6–20)
CO2: 28 mmol/L (ref 22–32)
Calcium: 8.5 mg/dL — ABNORMAL LOW (ref 8.9–10.3)
Chloride: 95 mmol/L — ABNORMAL LOW (ref 98–111)
Creatinine, Ser: 1.19 mg/dL — ABNORMAL HIGH (ref 0.44–1.00)
GFR, Estimated: 60 mL/min — ABNORMAL LOW (ref >60)
Glucose, Bld: 170 mg/dL — ABNORMAL HIGH (ref 70–99)
Potassium: 4.4 mmol/L (ref 3.5–5.1)
Sodium: 130 mmol/L — ABNORMAL LOW (ref 135–145)

## 2021-07-19 LAB — GLUCOSE, CAPILLARY
Glucose-Capillary: 148 mg/dL — ABNORMAL HIGH (ref 70–99)
Glucose-Capillary: 181 mg/dL — ABNORMAL HIGH (ref 70–99)
Glucose-Capillary: 276 mg/dL — ABNORMAL HIGH (ref 70–99)
Glucose-Capillary: 244 mg/dL — ABNORMAL HIGH (ref 70–99)

## 2021-07-19 MED ORDER — FLUCONAZOLE 100 MG PO TABS
100.0000 mg | ORAL_TABLET | Freq: Every day | ORAL | Status: DC
  Administered 2021-07-19 – 2021-07-24 (×6): 100 mg via ORAL
  Filled 2021-07-19 (×6): qty 1

## 2021-07-19 MED ORDER — FUROSEMIDE 10 MG/ML IJ SOLN
80.0000 mg | Freq: Two times a day (BID) | INTRAMUSCULAR | Status: DC
  Administered 2021-07-19 – 2021-07-26 (×14): 80 mg via INTRAVENOUS
  Filled 2021-07-19 (×14): qty 8

## 2021-07-19 MED ORDER — FUROSEMIDE 10 MG/ML IJ SOLN
80.0000 mg | Freq: Once | INTRAMUSCULAR | Status: AC
  Administered 2021-07-19: 80 mg via INTRAVENOUS
  Filled 2021-07-19: qty 8

## 2021-07-19 MED ORDER — FERROUS SULFATE 325 (65 FE) MG PO TABS
325.0000 mg | ORAL_TABLET | Freq: Two times a day (BID) | ORAL | Status: DC
  Administered 2021-07-19 – 2021-07-23 (×8): 325 mg via ORAL
  Filled 2021-07-19 (×8): qty 1

## 2021-07-19 MED ORDER — NYSTATIN 100000 UNIT/GM EX POWD
Freq: Two times a day (BID) | CUTANEOUS | Status: DC
  Administered 2021-07-21 – 2021-07-26 (×2): 1 via TOPICAL
  Filled 2021-07-19 (×5): qty 15

## 2021-07-19 NOTE — Progress Notes (Signed)
Per d/w Bettina Gavia PA, she received call inquiring from patient about whether patient should get ongoing Lasix dose - plan is to await MD rounds. This patient has not yet had BMET ordered today so order was placed.

## 2021-07-19 NOTE — Progress Notes (Signed)
PROGRESS NOTE    Lindsay [name]  VZD:[id number] DOB: September 03, 1981 DOA: 07/10/2021 PCP: Grayce Sessions, NP    Chief Complaint  Patient presents with   Shortness of Breath    Brief Narrative:  Lindsay [name] is a 40 y.o. female with medical history significant of acute renal failure in 2016, asthma, RUE cellulitis, history of CAD, history of NSTEMI in 2017/status post angioplasty with stent, insulin requiring type II DM, hyperlipidemia, hypertension who is brought to the emergency department via EMS due to progressively worsening dyspnea since 07/09/2021 evening that did not respond to her home albuterol, along with fever, was diagnosed with CAP along with acute on chronic diastolic CHF and admitted to the hospital. She completed the course of antibiotics for CAP. She is currently being diuresed for diastolic CHF with high doses of lasix. Cardiology on board.   Assessment & Plan:   Principal Problem:   CAP (community acquired pneumonia) Active Problems:   Essential hypertension   DM neuropathy, type II diabetes mellitus (HCC)   Coronary artery disease involving native coronary artery of native heart with angina pectoris (HCC)   OSA (obstructive sleep apnea)   Hyperlipidemia   Normocytic anemia   Acute respiratory failure with hypoxia probably secondary to a combination of community-acquired pneumonia and acute on chronic diastolic heart here, in the setting of OHS and obstructive sleep apnea. Last echocardiogram showed preserved left ventricular ejection fraction.  Completed the course of IV antibiotics.  Continue with BiPAP at night, and during the day she is on [place] oxygen. She was on 7 lit of [place] oxygen, weaned her down to 2 lit of [place] oxygen.   CXR shows features consistent with CHF She will probably need oxygen on discharge.  VBG results PH-7.42, PCO2-43.2, PO2-147, HCO3-28.4. Check ambulating oxygen levels today.    Acute on chronic diastolic heart failure  IV lasix  increased from 20 BID to 80 mg BID with good diuresis. She has diuresed about 6.4 over the last 24 hours.  Cardiology on board and appreciate recommendations.   Monitor renal parameters while on lasix.  Continue with strict intake and output and daily weights.     CAD S/P PCI in 2021. Pt currently denies any chest pain.  Continue with aspirin, bb and statin.    Hypertension BP parameters are optimal.  Pt on metoprolol 100 mg BID, imdur 60 mg daily, Hydralazine 100 mg TID, amlodipine 10 mg daily.    Dyslipidemia:  Resume statin.    Morbid obesity .  Body mass index is 71.13 kg/m. Has underlying OSA and OHS.  Has CPAP at home, while in the hospital she is using BIPAP. Will probably need trilogy on discharge.    Mild AKI  Creatinine on admission was 0.88 , worsened to 1.4 and improved to 1.1 with diuresis.  Continue to monitor.  Avoid nephrotoxins.     Hyperkalemia:  Resolved.    Urinary retention:  S/p foley. Will probably need voiding trial after we change from IV lasix to oral lasix.  Continue with flomax.    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 CBGS are better controlled today.    Anemia of chronic disease:  Hemoglobin last year around 24. Admitted with a hemoglobin of 9.7, current at 8.4.  get stool for occult blood.  Anemia panel revealed low iron levels.  Iron supplementation will be added.   Left upper extremity swelling: post PICC line placement.  Ordered venous duplex of the left upper extremity to evaluate for DVT, which is  negative.    Hyponatremia:  Probably from fluid  overload.  Improving with IV diuresis.   DVT prophylaxis: (Lovenox) Code Status: FULL CODE.  Family Communication: (NONE AT BEDSIDE) Disposition:   Status is: Inpatient  Remains inpatient appropriate because:Ongoing diagnostic testing needed not appropriate for outpatient work up and IV treatments appropriate due to intensity of illness or inability to take PO  Dispo: The patient is from: Home              Anticipated d/c is to: Home              Patient currently is not medically stable to d/c.   Difficult to place patient No       Consultants:  NONE.  Procedures: none.  Antimicrobials:  Antibiotics Given (last 72 hours)     Date/Time Action Medication Dose Rate   07/16/21 2154 New Bag/Given   cefTRIAXone (ROCEPHIN) 2 g in sodium chloride 0.9 % 100 mL IVPB 2 g 200 mL/hr   07/16/21 2317 New Bag/Given   azithromycin (ZITHROMAX) 500 mg in sodium chloride 0.9 % 250 mL IVPB 500 mg 250 mL/hr         Subjective: Breathing has improved.   Objective: Vitals:   07/19/21 0621 07/19/21 0823 07/19/21 0836 07/19/21 1154  BP: 118/67  134/73 123/67  Pulse: 66  68 66  Resp: 18   20  Temp: 98.4 F (36.9 C)   98.5 F (36.9 C)  TempSrc: Oral   Axillary  SpO2: 95% 97%  98%  Weight:      Height:        Intake/Output Summary (Last 24 hours) at 07/19/2021 1200 Last data filed at 07/19/2021 6962 Gross per 24 hour  Intake --  Output 4500 ml  Net -4500 ml    Filed Weights   07/10/21 2135 07/18/21 1130 07/19/21 0500  Weight: (!) 198.4 kg (!) 208.2 kg (!) 206 kg    Examination:  General exam: Morbidly obese lady not in any kind of distress Respiratory system: Diminished air entry at bases, no wheezing, on 2 L of nasal cannula oxygen Cardiovascular system: Regular rate rhythm, edema is improving, JVD cannot be appreciated Gastrointestinal system: Abdomen is soft nontender bowel sounds normal Central nervous system: Alert and oriented, grossly nonfocal Extremities: Pedal  edema present Skin: No rashes seen Psychiatry: Mood is appropriate    Data Reviewed: I have personally reviewed following labs and imaging studies  CBC: Recent Labs  Lab 07/14/21 0214 07/15/21 0029 07/16/21 0114 07/16/21 1602 07/17/21 0610 07/18/21 0740  WBC 9.3 9.9 8.8  --  7.3 6.9  NEUTROABS 4.9 6.6 6.3  --  4.8 4.4  HGB 8.9* 9.0* 9.2* 8.8* 8.4* 8.6*  HCT 29.0* 29.9* 30.3* 26.0* 27.3* 27.6*  MCV 84.8 86.2 86.1  --  86.1 85.4  PLT 264 303 295  --  302 314     Basic Metabolic Panel: Recent Labs  Lab 07/13/21 1219 07/14/21 0214 07/15/21 0029 07/16/21 0114 07/16/21 1602 07/17/21 0610 07/18/21 0740 07/19/21 1005  NA 133* 128* 130* 129* 132* 130* 129* 130*  K 4.3 4.9 5.2* 4.9 4.4 5.0 4.9 4.4  CL 98 93* 97* 95*  --  96* 95* 95*  CO2 26 26 25 26   --  28 28 28   GLUCOSE 174* 261* 234* 195*  --  150* 196* 170*  BUN 23* 27* 32* 38*  --  40* 45* 41*  CREATININE 1.40* 1.49* 1.31* 1.39*  --  1.32* 1.34* 1.19*  CALCIUM 8.5* 8.3* 8.6* 8.7*  --  8.5* 8.5* 8.5*  MG 2.0 2.0 2.2  --   --   --   --   --      GFR: Estimated Creatinine Clearance: 119.6 mL/min (A) (by C-G formula based on SCr of 1.19 mg/dL (H)).  Liver Function Tests: Recent Labs  Lab 07/13/21 1219 07/14/21 0214 07/15/21 0029 07/16/21 0114 07/17/21 0610  AST 33 34 26 28 26   ALT 29 31 29 31 29   ALKPHOS 94 96 86 85 76  BILITOT 0.7 0.6 0.6 0.6 0.3  PROT 7.0 7.1 7.2 7.5 6.8  ALBUMIN 2.6* 2.7* 2.9* 2.9* 2.8*     CBG: Recent Labs  Lab 07/18/21 1228 07/18/21 1644 07/18/21 2114 07/19/21 0743 07/19/21 1150  GLUCAP 199* 220* 165* 148* 181*      Recent Results (from the past 240 hour(s))  Resp Panel by RT-PCR (Flu A&B, Covid) Nasopharyngeal Swab     Status: None   Collection Time: 07/10/21 10:54 PM   Specimen: Nasopharyngeal Swab; Nasopharyngeal(NP) swabs in vial transport medium  Result Value Ref Range Status   SARS Coronavirus 2 by RT PCR NEGATIVE NEGATIVE Final    Comment: (NOTE) SARS-CoV-2  target nucleic acids are NOT DETECTED.  The SARS-CoV-2 RNA is generally detectable in upper respiratory specimens during the acute phase of infection. The lowest concentration of SARS-CoV-2 viral copies this assay can detect is 138 copies/mL. A negative result does not preclude SARS-Cov-2 infection and should not be used as the sole basis for treatment or other patient management decisions. A negative result may occur with  improper specimen collection/handling, submission of specimen other than nasopharyngeal swab, presence of viral mutation(s) within the areas targeted by this assay, and inadequate number of viral copies(<138 copies/mL). A negative result must be combined with clinical observations, patient history, and epidemiological information. The expected result is Negative.  Fact Sheet for Patients:  BloggerCourse.com  Fact Sheet for Healthcare Providers:  SeriousBroker.it  This test is no t yet approved or cleared by the [place] FDA and  has been authorized for detection and/or diagnosis of SARS-CoV-2 by FDA under an Emergency Use Authorization (EUA). This EUA will remain  in effect (meaning this test can be used) for the duration of the COVID-19 declaration under Section 564(b)(1) of the Act, 21 U.S.C.section 360bbb-3(b)(1), unless the authorization is terminated  or revoked sooner.       Influenza A by PCR NEGATIVE NEGATIVE Final   Influenza B by PCR NEGATIVE NEGATIVE Final    Comment: (NOTE) The Xpert Xpress SARS-CoV-2/FLU/RSV plus assay is intended as an aid in the diagnosis of influenza from Nasopharyngeal swab specimens and should not be used as a sole basis for treatment. Nasal washings and aspirates are unacceptable for Xpert Xpress SARS-CoV-2/FLU/RSV testing.  Fact Sheet for Patients: BloggerCourse.com  Fact Sheet for Healthcare  Providers: SeriousBroker.it  This test is not yet approved or cleared by the [place] FDA and has been authorized for detection and/or diagnosis of SARS-CoV-2 by FDA under  an Emergency Use Authorization (EUA). This EUA will remain in effect (meaning this test can be used) for the duration of the COVID-19 declaration under Section 564(b)(1) of the Act, 21 U.S.C. section 360bbb-3(b)(1), unless the authorization is terminated or revoked.  Performed at [facility], [address], [place], [place] [address]   Blood Culture (routine x 2)     Status: None   Collection Time: 07/10/21 11:04 PM   Specimen: BLOOD  Result Value Ref Range Status   Specimen Description BLOOD RIGHT ANTECUBITAL  Final   Special Requests   Final    BOTTLES DRAWN AEROBIC AND ANAEROBIC Blood Culture results may not be optimal due to an excessive volume of blood received in culture bottles   Culture   Final    NO GROWTH 5 DAYS Performed at [facility], [address] [place], [place] [address]    Report Status 07/16/2021 FINAL  Final  Urine Culture     Status: Abnormal   Collection Time: 07/11/21  1:17 AM   Specimen: Urine, Random  Result Value Ref Range Status   Specimen Description URINE, RANDOM  Final   Special Requests NONE  Final   Culture (A)  Final    <10,000 COLONIES/mL INSIGNIFICANT GROWTH Performed at [facility], [address], [place], [place] [address]    Report Status 07/12/2021 FINAL  Final  MRSA Next Gen by PCR, Nasal     Status: Abnormal   Collection Time: 07/11/21  6:01 PM   Specimen: Nasal Mucosa; Nasal Swab  Result Value Ref Range Status   MRSA by PCR Next Gen DETECTED (A) NOT DETECTED Final    Comment: RESULT CALLED TO, READ BACK BY AND VERIFIED WITH: E [facility] RN 2108 07/11/21 A BROWNING (NOTE) The GeneXpert MRSA Assay (FDA approved for NASAL specimens only), is one component of a comprehensive MRSA colonization surveillance program.  It is not intended to diagnose MRSA infection nor to guide or monitor treatment for MRSA infections. Test performance is not FDA approved in patients less than 38 years old. Performed at [facility], [address], [place], [place] [address]           Radiology Studies: DG Chest 2 View  Result Date: 07/17/2021 CLINICAL DATA:  40 year old female with fever. EXAM: CHEST - 2 VIEW COMPARISON:  Chest radiograph dated 07/15/2021. FINDINGS: Faintly seen left-sided PICC with tip likely in the central SVC. The tip of the PICC appears curved up. Stable cardiomegaly. Diffuse interstitial and vascular prominence, likely edema. Overall similar or slightly progressed CHF compared to the prior radiograph. Superimposed pneumonia is not excluded. Clinical correlation is recommended. No acute osseous pathology. IMPRESSION: 1. Left-sided PICC with tip likely in the central SVC. 2. Cardiomegaly with findings of CHF similar or slightly progressed compared to the prior radiograph. Superimposed pneumonia is not excluded. Electronically Signed   By: Elgie Collard M.D.   On: 07/17/2021 18:55   VAS [place] UPPER EXTREMITY VENOUS DUPLEX  Result Date: 07/18/2021 UPPER VENOUS STUDY  Patient Name:  Lindsay [name]  Date of Exam:   07/18/2021 Medical Rec #: [id number]          Accession #:    [id number] Date of Birth: 11/04/1981          Patient Gender: F Patient Age:   46Y Exam Location:  [facility] Procedure:      VAS [place] UPPER EXTREMITY VENOUS DUPLEX Referring Phys: [id number] Angeleena Dueitt --------------------------------------------------------------------------------  Indications: Swelling Risk Factors: Surgery PICC line placement. Limitations: Body habitus and poor ultrasound/tissue interface. Comparison Study: No prior studies. Performing Technologist: Chanda Busing RVT  Examination Guidelines: A complete evaluation includes B-mode imaging, spectral Doppler, color Doppler, and power Doppler as needed of all  accessible portions of each vessel. Bilateral testing is considered an integral part of a complete examination. Limited examinations for reoccurring indications may be performed as noted.  Right Findings: +----------+------------+---------+-----------+----------+-------+ RIGHT     CompressiblePhasicitySpontaneousPropertiesSummary +----------+------------+---------+-----------+----------+-------+ Subclavian    Full       Yes       Yes                      +----------+------------+---------+-----------+----------+-------+  Left Findings: +----------+------------+---------+-----------+----------+-------+ LEFT      CompressiblePhasicitySpontaneousPropertiesSummary +----------+------------+---------+-----------+----------+-------+ IJV           Full       Yes       Yes                      +----------+------------+---------+-----------+----------+-------+ Subclavian    Full       Yes       Yes                      +----------+------------+---------+-----------+----------+-------+ Axillary      Full       Yes       Yes                      +----------+------------+---------+-----------+----------+-------+ Brachial      Full       Yes       Yes                      +----------+------------+---------+-----------+----------+-------+ Radial        Full                                          +----------+------------+---------+-----------+----------+-------+ Ulnar         Full                                          +----------+------------+---------+-----------+----------+-------+ Cephalic      Full                                          +----------+------------+---------+-----------+----------+-------+ Basilic       Full                                          +----------+------------+---------+-----------+----------+-------+  Summary:  Right: No evidence of thrombosis in the subclavian.  Left: No evidence of deep vein thrombosis in the upper  extremity. No evidence of superficial vein thrombosis in the upper extremity.  *See table(s) above for measurements and observations.  Diagnosing physician: Waverly Ferrari MD Electronically signed by Waverly Ferrari MD on 07/18/2021 at 5:32:18 PM.    Final         Scheduled Meds:  amLODipine  10 mg Oral Daily   aspirin  81 mg Oral  Daily   atorvastatin  40 mg Oral Daily   budesonide (PULMICORT) nebulizer solution  0.25 mg Nebulization BID   Chlorhexidine Gluconate Cloth  6 each Topical Daily   DULoxetine  30 mg Oral q AM   empagliflozin  10 mg Oral Daily   enoxaparin (LOVENOX) injection  100 mg Subcutaneous Q24H   fluconazole  100 mg Oral Daily   furosemide  80 mg Intravenous BID   gabapentin  600 mg Oral TID   hydrALAZINE  100 mg Oral Q8H   insulin aspart  0-5 Units Subcutaneous QHS   insulin aspart  0-9 Units Subcutaneous TID WC   insulin aspart  40 Units Subcutaneous TID WC   insulin glargine-yfgn  20 Units Subcutaneous QHS   ipratropium-albuterol  3 mL Nebulization BID   isosorbide mononitrate  60 mg Oral Daily   metoprolol tartrate  100 mg Oral BID   nystatin   Topical BID   sodium chloride flush  10-40 mL Intracatheter Q12H   tamsulosin  0.4 mg Oral Daily   Continuous Infusions:  sodium chloride 10 mL/hr at 07/16/21 2153     LOS: 9 days        Kathlen Mody, MD [facility]   To contact the attending provider between 7A-7P or the covering provider during after hours 7P-7A, please log into the web site [email or web address] and access using universal [place] password for that web site. If you do not have the password, please call the hospital operator.  07/19/2021, 12:00 PM

## 2021-07-19 NOTE — Progress Notes (Signed)
Progress Note  Patient Name: Lindsay [name] Date of Encounter: 07/19/2021  [facility] Cardiologist: Lesleigh Noe, MD   Subjective   NAEO. Still volume overloaded on exam. On nasal cannula.  Inpatient Medications    Scheduled Meds:  amLODipine  10 mg Oral Daily   aspirin  81 mg Oral Daily   atorvastatin  40 mg Oral Daily   budesonide (PULMICORT) nebulizer solution  0.25 mg Nebulization BID   Chlorhexidine Gluconate Cloth  6 each Topical Daily   DULoxetine  30 mg Oral q AM   empagliflozin  10 mg Oral Daily   enoxaparin (LOVENOX) injection  100 mg Subcutaneous Q24H   furosemide  40 mg Intravenous BID   gabapentin  600 mg Oral TID   hydrALAZINE  100 mg Oral Q8H   insulin aspart  0-5 Units Subcutaneous QHS   insulin aspart  0-9 Units Subcutaneous TID WC   insulin aspart  40 Units Subcutaneous TID WC   insulin glargine-yfgn  20 Units Subcutaneous QHS   ipratropium-albuterol  3 mL Nebulization BID   isosorbide mononitrate  60 mg Oral Daily   metoprolol tartrate  100 mg Oral BID   sodium chloride flush  10-40 mL Intracatheter Q12H   tamsulosin  0.4 mg Oral Daily   Continuous Infusions:  sodium chloride 10 mL/hr at 07/16/21 2153   PRN Meds: sodium chloride, albuterol, aspirin-acetaminophen-caffeine, baclofen, hydrALAZINE, morphine injection, nitroGLYCERIN, [DISCONTINUED] ondansetron **OR** ondansetron (ZOFRAN) IV, sodium chloride flush   Vital Signs    Vitals:   07/19/21 0500 07/19/21 0621 07/19/21 0823 07/19/21 0836  BP:  118/67  134/73  Pulse:  66  68  Resp:  18    Temp:  98.4 F (36.9 C)    TempSrc:  Oral    SpO2:  95% 97%   Weight: (!) 206 kg     Height:        Intake/Output Summary (Last 24 hours) at 07/19/2021 0853 Last data filed at 07/19/2021 0623 Gross per 24 hour  Intake --  Output 5400 ml  Net -5400 ml   Last 3 Weights 07/19/2021 07/18/2021 07/10/2021  Weight (lbs) 454 lb 2.4 oz 458 lb 14.4 oz 437 lb 8 oz  Weight (kg) 206 kg 208.156 kg  198.449 kg      Telemetry    Sinus rhythm. - Personally Reviewed  ECG    Personally Reviewed  Physical Exam   GEN: No acute distress.  In bed at 30 degrees. Morbidly obese. Neck: not able to assess b/c of body habitus Cardiac: RRR, no murmurs, rubs, or gallops.  Respiratory: no iwob GI: Soft, nontender, non-distended  MS: 1+ pitting bilateral edema; No deformity. Neuro:  Nonfocal  Psych: Normal affect   Labs    High Sensitivity Troponin:   Recent Labs  Lab 07/10/21 2300 07/11/21 0614 07/11/21 1656 07/11/21 2111 07/12/21 1723  TROPONINIHS 18* 13 9 9 9       Chemistry Recent Labs  Lab 07/15/21 0029 07/16/21 0114 07/16/21 1602 07/17/21 0610 07/18/21 0740  NA 130* 129* 132* 130* 129*  K 5.2* 4.9 4.4 5.0 4.9  CL 97* 95*  --  96* 95*  CO2 25 26  --  28 28  GLUCOSE 234* 195*  --  150* 196*  BUN 32* 38*  --  40* 45*  CREATININE 1.31* 1.39*  --  1.32* 1.34*  CALCIUM 8.6* 8.7*  --  8.5* 8.5*  PROT 7.2 7.5  --  6.8  --  ALBUMIN 2.9* 2.9*  --  2.8*  --   AST 26 28  --  26  --   ALT 29 31  --  29  --   ALKPHOS 86 85  --  76  --   BILITOT 0.6 0.6  --  0.3  --   GFRNONAA 53* 50*  --  53* 52*  ANIONGAP 8 8  --  6 6     Hematology Recent Labs  Lab 07/16/21 0114 07/16/21 1602 07/17/21 0610 07/17/21 1746 07/18/21 0740  WBC 8.8  --  7.3  --  6.9  RBC 3.52*  --  3.17* 3.33* 3.23*  HGB 9.2* 8.8* 8.4*  --  8.6*  HCT 30.3* 26.0* 27.3*  --  27.6*  MCV 86.1  --  86.1  --  85.4  MCH 26.1  --  26.5  --  26.6  MCHC 30.4  --  30.8  --  31.2  RDW 14.3  --  14.3  --  14.3  PLT 295  --  302  --  314    BNP Recent Labs  Lab 07/15/21 0029 07/16/21 0114 07/17/21 0610  BNP 148.1* 179.8* 190.7*     DDimer  Recent Labs  Lab 07/13/21 1219 07/14/21 0214 07/15/21 0029  DDIMER 1.92* 1.50* 1.35*     Radiology    DG Chest 2 View  Result Date: 07/17/2021 CLINICAL DATA:  40 year old female with fever. EXAM: CHEST - 2 VIEW COMPARISON:  Chest radiograph dated  07/15/2021. FINDINGS: Faintly seen left-sided PICC with tip likely in the central SVC. The tip of the PICC appears curved up. Stable cardiomegaly. Diffuse interstitial and vascular prominence, likely edema. Overall similar or slightly progressed CHF compared to the prior radiograph. Superimposed pneumonia is not excluded. Clinical correlation is recommended. No acute osseous pathology. IMPRESSION: 1. Left-sided PICC with tip likely in the central SVC. 2. Cardiomegaly with findings of CHF similar or slightly progressed compared to the prior radiograph. Superimposed pneumonia is not excluded. Electronically Signed   By: Elgie Collard M.D.   On: 07/17/2021 18:55   VAS [place] UPPER EXTREMITY VENOUS DUPLEX  Result Date: 07/18/2021 UPPER VENOUS STUDY  Patient Name:  Lindsay [name]  Date of Exam:   07/18/2021 Medical Rec #: [id number]          Accession #:    [id number] Date of Birth: [birth year]          Patient Gender: F Patient Age:   40Y Exam Location:  [facility] Procedure:      VAS [place] UPPER EXTREMITY VENOUS DUPLEX Referring Phys: [id number] VIJAYA AKULA --------------------------------------------------------------------------------  Indications: Swelling Risk Factors: Surgery PICC line placement. Limitations: Body habitus and poor ultrasound/tissue interface. Comparison Study: No prior studies. Performing Technologist: Chanda Busing RVT  Examination Guidelines: A complete evaluation includes B-mode imaging, spectral Doppler, color Doppler, and power Doppler as needed of all accessible portions of each vessel. Bilateral testing is considered an integral part of a complete examination. Limited examinations for reoccurring indications may be performed as noted.  Right Findings: +----------+------------+---------+-----------+----------+-------+ RIGHT     CompressiblePhasicitySpontaneousPropertiesSummary +----------+------------+---------+-----------+----------+-------+ Subclavian    Full       Yes        Yes                      +----------+------------+---------+-----------+----------+-------+  Left Findings: +----------+------------+---------+-----------+----------+-------+ LEFT      CompressiblePhasicitySpontaneousPropertiesSummary +----------+------------+---------+-----------+----------+-------+ IJV           Full  Yes       Yes                      +----------+------------+---------+-----------+----------+-------+ Subclavian    Full       Yes       Yes                      +----------+------------+---------+-----------+----------+-------+ Axillary      Full       Yes       Yes                      +----------+------------+---------+-----------+----------+-------+ Brachial      Full       Yes       Yes                      +----------+------------+---------+-----------+----------+-------+ Radial        Full                                          +----------+------------+---------+-----------+----------+-------+ Ulnar         Full                                          +----------+------------+---------+-----------+----------+-------+ Cephalic      Full                                          +----------+------------+---------+-----------+----------+-------+ Basilic       Full                                          +----------+------------+---------+-----------+----------+-------+  Summary:  Right: No evidence of thrombosis in the subclavian.  Left: No evidence of deep vein thrombosis in the upper extremity. No evidence of superficial vein thrombosis in the upper extremity.  *See table(s) above for measurements and observations.  Diagnosing physician: Waverly Ferrari MD Electronically signed by Waverly Ferrari MD on 07/18/2021 at 5:32:18 PM.    Final     Cardiac Studies   No new   Assessment & Plan    39yo morbidly obesee woman admitted with acute on chronic diastolic heart failure.  #Acute on chronic diastolic  heart failure - lasix 80mg  IV BID. End point will likely be slight bump in Creatinine - please limit her fluid intake. She has 3 cups of liquids and 2 cans on her bedside table today  #OSA Cont CPAP  #Morbid obesity   For questions or updates, please contact [facility] Please consult [email or web address] for contact info under        Signed, , MD  07/19/2021, 8:53 AM

## 2021-07-20 DIAGNOSIS — J9601 Acute respiratory failure with hypoxia: Secondary | ICD-10-CM | POA: Diagnosis not present

## 2021-07-20 DIAGNOSIS — I10 Essential (primary) hypertension: Secondary | ICD-10-CM | POA: Diagnosis not present

## 2021-07-20 DIAGNOSIS — E1140 Type 2 diabetes mellitus with diabetic neuropathy, unspecified: Secondary | ICD-10-CM | POA: Diagnosis not present

## 2021-07-20 DIAGNOSIS — I5031 Acute diastolic (congestive) heart failure: Secondary | ICD-10-CM | POA: Diagnosis not present

## 2021-07-20 DIAGNOSIS — D649 Anemia, unspecified: Secondary | ICD-10-CM | POA: Diagnosis not present

## 2021-07-20 DIAGNOSIS — G4733 Obstructive sleep apnea (adult) (pediatric): Secondary | ICD-10-CM | POA: Diagnosis not present

## 2021-07-20 DIAGNOSIS — Z794 Long term (current) use of insulin: Secondary | ICD-10-CM | POA: Diagnosis not present

## 2021-07-20 DIAGNOSIS — I25119 Atherosclerotic heart disease of native coronary artery with unspecified angina pectoris: Secondary | ICD-10-CM | POA: Diagnosis not present

## 2021-07-20 DIAGNOSIS — J189 Pneumonia, unspecified organism: Secondary | ICD-10-CM | POA: Diagnosis not present

## 2021-07-20 LAB — BASIC METABOLIC PANEL
Anion gap: 8 (ref 5–15)
BUN: 37 mg/dL — ABNORMAL HIGH (ref 6–20)
CO2: 32 mmol/L (ref 22–32)
Calcium: 8.7 mg/dL — ABNORMAL LOW (ref 8.9–10.3)
Chloride: 97 mmol/L — ABNORMAL LOW (ref 98–111)
Creatinine, Ser: 1.13 mg/dL — ABNORMAL HIGH (ref 0.44–1.00)
GFR, Estimated: >60 mL/min (ref >60)
Glucose, Bld: 112 mg/dL — ABNORMAL HIGH (ref 70–99)
Potassium: 4.1 mmol/L (ref 3.5–5.1)
Sodium: 137 mmol/L (ref 135–145)

## 2021-07-20 LAB — GLUCOSE, CAPILLARY
Glucose-Capillary: 131 mg/dL — ABNORMAL HIGH (ref 70–99)
Glucose-Capillary: 177 mg/dL — ABNORMAL HIGH (ref 70–99)
Glucose-Capillary: 191 mg/dL — ABNORMAL HIGH (ref 70–99)
Glucose-Capillary: 198 mg/dL — ABNORMAL HIGH (ref 70–99)

## 2021-07-20 NOTE — Progress Notes (Addendum)
Progress Note  Patient Name: Lindsay [name] Date of Encounter: 07/20/2021  [facility] Cardiologist: Lesleigh Noe, MD   Subjective   NAEO. 3L net negative reported in chart although intake looks incomplete Weight not significantly changed.  Inpatient Medications    Scheduled Meds:  amLODipine  10 mg Oral Daily   aspirin  81 mg Oral Daily   atorvastatin  40 mg Oral Daily   budesonide (PULMICORT) nebulizer solution  0.25 mg Nebulization BID   Chlorhexidine Gluconate Cloth  6 each Topical Daily   DULoxetine  30 mg Oral q AM   empagliflozin  10 mg Oral Daily   enoxaparin (LOVENOX) injection  100 mg Subcutaneous Q24H   ferrous sulfate  325 mg Oral BID WC   fluconazole  100 mg Oral Daily   furosemide  80 mg Intravenous BID   gabapentin  600 mg Oral TID   hydrALAZINE  100 mg Oral Q8H   insulin aspart  0-5 Units Subcutaneous QHS   insulin aspart  0-9 Units Subcutaneous TID WC   insulin aspart  40 Units Subcutaneous TID WC   insulin glargine-yfgn  20 Units Subcutaneous QHS   ipratropium-albuterol  3 mL Nebulization BID   isosorbide mononitrate  60 mg Oral Daily   metoprolol tartrate  100 mg Oral BID   nystatin   Topical BID   sodium chloride flush  10-40 mL Intracatheter Q12H   tamsulosin  0.4 mg Oral Daily   Continuous Infusions:  sodium chloride 10 mL/hr at 07/16/21 2153   PRN Meds: sodium chloride, albuterol, aspirin-acetaminophen-caffeine, baclofen, hydrALAZINE, morphine injection, nitroGLYCERIN, [DISCONTINUED] ondansetron **OR** ondansetron (ZOFRAN) IV, sodium chloride flush   Vital Signs    Vitals:   07/19/21 2014 07/19/21 2015 07/20/21 0002 07/20/21 0430  BP: 130/60   127/61  Pulse: 68  77 63  Resp: 18  (!) 25 16  Temp: 97.8 F (36.6 C)   98.7 F (37.1 C)  TempSrc: Axillary   Oral  SpO2: 95% 100% 98% 100%  Weight:    (!) 205.9 kg  Height:        Intake/Output Summary (Last 24 hours) at 07/20/2021 0809 Last data filed at 07/20/2021 0529 Gross  per 24 hour  Intake 900 ml  Output 5075 ml  Net -4175 ml    Last 3 Weights 07/20/2021 07/19/2021 07/18/2021  Weight (lbs) 454 lb 454 lb 2.4 oz 458 lb 14.4 oz  Weight (kg) 205.933 kg 206 kg 208.156 kg      Telemetry    Sinus rhythm. - Personally Reviewed  ECG    Personally Reviewed  Physical Exam   GEN: No acute distress.  Morbidly obese. Neck: not able to assess b/c of body habitus Cardiac: RRR, no murmurs, rubs, or gallops.  Respiratory: no iwob GI: Soft, nontender, non-distended  MS: 1+ pitting bilateral edema (unchanged); No deformity. Neuro:  Nonfocal  Psych: Normal affect   Labs    High Sensitivity Troponin:   Recent Labs  Lab 07/10/21 2300 07/11/21 0614 07/11/21 1656 07/11/21 2111 07/12/21 1723  TROPONINIHS 18* 13 9 9 9        Chemistry Recent Labs  Lab 07/15/21 0029 07/16/21 0114 07/16/21 1602 07/17/21 0610 07/18/21 0740 07/19/21 1005 07/20/21 0210  NA 130* 129*   < > 130* 129* 130* 137  K 5.2* 4.9   < > 5.0 4.9 4.4 4.1  CL 97* 95*  --  96* 95* 95* 97*  CO2 25 26  --  28 28  28 32  GLUCOSE 234* 195*  --  150* 196* 170* 112*  BUN 32* 38*  --  40* 45* 41* 37*  CREATININE 1.31* 1.39*  --  1.32* 1.34* 1.19* 1.13*  CALCIUM 8.6* 8.7*  --  8.5* 8.5* 8.5* 8.7*  PROT 7.2 7.5  --  6.8  --   --   --   ALBUMIN 2.9* 2.9*  --  2.8*  --   --   --   AST 26 28  --  26  --   --   --   ALT 29 31  --  29  --   --   --   ALKPHOS 86 85  --  76  --   --   --   BILITOT 0.6 0.6  --  0.3  --   --   --   GFRNONAA 53* 50*  --  53* 52* 60* >60  ANIONGAP 8 8  --  6 6 7 8    < > = values in this interval not displayed.      Hematology Recent Labs  Lab 07/16/21 0114 07/16/21 1602 07/17/21 0610 07/17/21 1746 07/18/21 0740  WBC 8.8  --  7.3  --  6.9  RBC 3.52*  --  3.17* 3.33* 3.23*  HGB 9.2* 8.8* 8.4*  --  8.6*  HCT 30.3* 26.0* 27.3*  --  27.6*  MCV 86.1  --  86.1  --  85.4  MCH 26.1  --  26.5  --  26.6  MCHC 30.4  --  30.8  --  31.2  RDW 14.3  --  14.3  --   14.3  PLT 295  --  302  --  314     BNP Recent Labs  Lab 07/15/21 0029 07/16/21 0114 07/17/21 0610  BNP 148.1* 179.8* 190.7*      DDimer  Recent Labs  Lab 07/13/21 1219 07/14/21 0214 07/15/21 0029  DDIMER 1.92* 1.50* 1.35*      Radiology    VAS 07/17/21 UPPER EXTREMITY VENOUS DUPLEX  Result Date: 07/18/2021 UPPER VENOUS STUDY  Patient Name:  Lindsay [name]  Date of Exam:   07/18/2021 Medical Rec #: 07/20/2021          Accession #:    [id number] Date of Birth: [birth year]          Patient Gender: F Patient Age:   40Y Exam Location:  [facility] Procedure:      VAS [facility] UPPER EXTREMITY VENOUS DUPLEX Referring Phys: [place] VIJAYA AKULA --------------------------------------------------------------------------------  Indications: Swelling Risk Factors: Surgery PICC line placement. Limitations: Body habitus and poor ultrasound/tissue interface. Comparison Study: No prior studies. Performing Technologist: [id number] RVT  Examination Guidelines: A complete evaluation includes B-mode imaging, spectral Doppler, color Doppler, and power Doppler as needed of all accessible portions of each vessel. Bilateral testing is considered an integral part of a complete examination. Limited examinations for reoccurring indications may be performed as noted.  Right Findings: +----------+------------+---------+-----------+----------+-------+ RIGHT     CompressiblePhasicitySpontaneousPropertiesSummary +----------+------------+---------+-----------+----------+-------+ Subclavian    Full       Yes       Yes                      +----------+------------+---------+-----------+----------+-------+  Left Findings: +----------+------------+---------+-----------+----------+-------+ LEFT      CompressiblePhasicitySpontaneousPropertiesSummary +----------+------------+---------+-----------+----------+-------+ IJV           Full       Yes       Yes                       +----------+------------+---------+-----------+----------+-------+  Subclavian    Full       Yes       Yes                      +----------+------------+---------+-----------+----------+-------+ Axillary      Full       Yes       Yes                      +----------+------------+---------+-----------+----------+-------+ Brachial      Full       Yes       Yes                      +----------+------------+---------+-----------+----------+-------+ Radial        Full                                          +----------+------------+---------+-----------+----------+-------+ Ulnar         Full                                          +----------+------------+---------+-----------+----------+-------+ Cephalic      Full                                          +----------+------------+---------+-----------+----------+-------+ Basilic       Full                                          +----------+------------+---------+-----------+----------+-------+  Summary:  Right: No evidence of thrombosis in the subclavian.  Left: No evidence of deep vein thrombosis in the upper extremity. No evidence of superficial vein thrombosis in the upper extremity.  *See table(s) above for measurements and observations.  Diagnosing physician: Waverly Ferrari MD Electronically signed by Waverly Ferrari MD on 07/18/2021 at 5:32:18 PM.    Final     Cardiac Studies   No new   Assessment & Plan    39yo morbidly obesee woman admitted with acute on chronic diastolic heart failure.  #Acute on chronic diastolic heart failure - lasix 80mg  IV BID. End point will likely be slight bump in Creatinine - please limit her fluid intake.   #OSA Cont CPAP  #Morbid obesity   For questions or updates, please contact [facility] Please consult [email or web address] for contact info under        Signed, , MD  07/20/2021, 8:09 AM

## 2021-07-20 NOTE — Progress Notes (Signed)
PROGRESS NOTE    Lindsay [name]  MLJ:[id number] DOB: 19-Aug-1981 DOA: 07/10/2021 PCP: Grayce Sessions, NP    Chief Complaint  Patient presents with   Shortness of Breath    Brief Narrative:  Lindsay [name] is a 40 y.o. female with medical history significant of acute renal failure in 2016, asthma, RUE cellulitis, history of CAD, history of NSTEMI in 2017/status post angioplasty with stent, insulin requiring type II DM, hyperlipidemia, hypertension who is brought to the emergency department via EMS due to progressively worsening dyspnea since 07/09/2021 evening that did not respond to her home albuterol, along with fever, was diagnosed with CAP along with acute on chronic diastolic CHF and admitted to the hospital. She completed the course of antibiotics for CAP. She is currently being diuresed for diastolic CHF with high doses of lasix. Cardiology on board.   Assessment & Plan:   Principal Problem:   CAP (community acquired pneumonia) Active Problems:   Essential hypertension   DM neuropathy, type II diabetes mellitus (HCC)   Coronary artery disease involving native coronary artery of native heart with angina pectoris (HCC)   OSA (obstructive sleep apnea)   Hyperlipidemia   Normocytic anemia   Acute respiratory failure with hypoxia probably secondary to a combination of community-acquired pneumonia and acute on chronic diastolic heart here, in the setting of OHS and obstructive sleep apnea. Last echocardiogram showed preserved left ventricular ejection fraction.  Completed the course of IV antibiotics.  Continue with BiPAP at night, and during the day she is on [place] oxygen. She was on 7 lit of [place] oxygen, weaned her down to 2 lit of [place] oxygen.   CXR shows features consistent with CHF She will probably need oxygen on discharge.  VBG results PH-7.42, PCO2-43.2, PO2-147, HCO3-28.4. Check ambulating oxygen levels today.    Acute on chronic diastolic heart failure  IV lasix  increased from 20 BID to 80 mg BID with good diuresis.  She has diuresed about 11 L since admission.  Cardiology on board and appreciate recommendations.  Renal parameters appears to be stable while on Lasix.  Blood Continue with strict intake and output and daily weights.     CAD S/P PCI in 2021. Pt currently denies any chest pain.  Continue with aspirin, bb and statin.    Hypertension Pressure parameters are optimal. Pt on metoprolol 100 mg BID, imdur 60 mg daily, Hydralazine 100 mg TID, amlodipine 10 mg daily.    Dyslipidemia:  Resume statin.    Morbid obesity .  Body mass index is 71.11 kg/m. Has underlying OSA and OHS.  Has CPAP at home, while in the hospital she is using BIPAP. Will probably need trilogy on discharge.    Mild AKI  Creatinine on admission was 0.88 , worsened to 1.4 and improved to 1.1 with diuresis.  Continue to monitor.  Creatinine stable at 1 on IV Lasix. Avoid nephrotoxins.     Hyperkalemia:  Resolved   Urinary retention:  S/p foley. Will probably need voiding trial after we change from IV lasix to oral lasix.  Continue with flomax.    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 CBGS are better controlled today.  No changes in medications at this time.   Anemia of chronic disease:  Hemoglobin last year around 7. Admitted with a hemoglobin of 9.7, current at 8.4.  get stool for occult blood.  Anemia panel revealed low iron levels.  Iron supplementation will be added on discharge  Left upper extremity swelling: post PICC line placement.  Ordered venous duplex of the  left upper extremity to evaluate for DVT, which is  negative.    Hyponatremia:  Resolved with diuresis.  DVT prophylaxis: (Lovenox) Code Status: FULL CODE.  Family Communication: (NONE AT BEDSIDE) Disposition:   Status is: Inpatient  Remains inpatient appropriate because:Ongoing diagnostic testing needed not appropriate for outpatient work up and IV treatments appropriate due to intensity of illness or inability to take PO  Dispo: The patient is from: Home              Anticipated d/c is to: Home              Patient currently is not medically stable to d/c.   Difficult to place patient No       Consultants:  NONE.  Procedures: none.  Antimicrobials:  Antibiotics Given (last 72 hours)     None         Subjective: Breathing has improved and she appears to be in good spirits  Objective: Vitals:   07/20/21 0828 07/20/21 0830 07/20/21 1133 07/20/21 1405  BP: (!) 151/70  122/62 123/60  Pulse:  69 62   Resp:   14   Temp:   98.3 F (36.8 C)   TempSrc:   Oral   SpO2:   100%   Weight:      Height:        Intake/Output Summary (Last 24 hours) at 07/20/2021 1452 Last data filed at 07/20/2021 1200 Gross per 24 hour  Intake 1320 ml  Output 4500 ml  Net -3180 ml    Filed Weights   07/18/21 1130 07/19/21 0500 07/20/21 0430  Weight: (!) 208.2 kg (!) 206 kg (!) 205.9 kg    Examination:  General exam: Morbidly obese young lady  not in any kind of distress Respiratory system: Diminished air entry at bases no wheezing heard Cardiovascular system: Regular rate rhythm, leg edema and edema in the arms improving/ Gastrointestinal system: Abdomen is nontender bowel sounds normal Central nervous system: Alert and oriented, grossly nonfocal Extremities: Pedal edema is improving neuro Skin: No rashes seen  Psychiatry: Mood is appropriate    Data Reviewed: I have personally reviewed following labs and imaging studies  CBC: Recent Labs  Lab 07/14/21 0214  07/15/21 0029 07/16/21 0114 07/16/21 1602 07/17/21 0610 07/18/21 0740  WBC 9.3 9.9 8.8  --  7.3 6.9  NEUTROABS 4.9 6.6 6.3  --  4.8 4.4  HGB 8.9* 9.0* 9.2* 8.8* 8.4* 8.6*  HCT 29.0* 29.9* 30.3* 26.0* 27.3* 27.6*  MCV 84.8 86.2 86.1  --  86.1 85.4  PLT 264 303 295  --  302 314     Basic Metabolic Panel: Recent Labs  Lab 07/14/21 0214 07/15/21 0029 07/16/21 0114 07/16/21 1602 07/17/21 0610 07/18/21 0740 07/19/21 1005 07/20/21 0210  NA 128* 130* 129* 132* 130* 129* 130* 137  K 4.9 5.2* 4.9 4.4 5.0 4.9 4.4 4.1  CL 93* 97* 95*  --  96* 95* 95* 97*  CO2 26 25 26   --  28 28 28  32  GLUCOSE 261* 234* 195*  --  150* 196* 170* 112*  BUN 27* 32* 38*  --  40* 45* 41* 37*  CREATININE 1.49* 1.31* 1.39*  --  1.32* 1.34* 1.19* 1.13*  CALCIUM 8.3* 8.6* 8.7*  --  8.5* 8.5* 8.5* 8.7*  MG 2.0 2.2  --   --   --   --   --   --      GFR: Estimated Creatinine Clearance: 125.9 mL/min (A) (by C-G formula based on SCr of 1.13 mg/dL (H)).  Liver Function Tests: Recent Labs  Lab 07/14/21 0214 07/15/21 0029 07/16/21 0114 07/17/21 0610  AST 34 26 28 26   ALT 31 29 31 29   ALKPHOS 96 86 85 76  BILITOT 0.6 0.6 0.6 0.3  PROT 7.1 7.2 7.5 6.8  ALBUMIN 2.7* 2.9* 2.9* 2.8*     CBG: Recent Labs  Lab 07/19/21 1150 07/19/21 1705 07/19/21 2118 07/20/21 0744 07/20/21 1131  GLUCAP 181* 276* 244* 131* 177*      Recent Results (from the past 240 hour(s))  Resp Panel by RT-PCR (Flu A&B, Covid) Nasopharyngeal Swab     Status: None   Collection Time: 07/10/21 10:54 PM   Specimen: Nasopharyngeal Swab; Nasopharyngeal(NP) swabs in vial transport medium  Result Value Ref Range Status   SARS Coronavirus 2 by RT PCR NEGATIVE NEGATIVE Final    Comment: (NOTE) SARS-CoV-2 target nucleic acids are NOT DETECTED.  The SARS-CoV-2 RNA is generally detectable in upper respiratory specimens during the acute phase of infection. The lowest concentration of SARS-CoV-2 viral copies this assay can detect  is 138 copies/mL. A negative result does not preclude SARS-Cov-2 infection and should not be used as the sole basis for treatment or other patient management decisions. A negative result may occur with  improper specimen collection/handling, submission of specimen other than nasopharyngeal swab, presence of viral mutation(s) within the areas targeted by this assay, and inadequate number of viral copies(<138 copies/mL). A negative result must be combined with clinical observations, patient history, and epidemiological information. The expected result is Negative.  Fact Sheet for Patients:  BloggerCourse.com  Fact Sheet for Healthcare Providers:  SeriousBroker.it  This test is no t yet approved or cleared by the [place] FDA and  has been authorized for detection and/or diagnosis of SARS-CoV-2 by FDA under an Emergency Use Authorization (EUA). This EUA will remain  in effect (meaning this test can be used) for the duration of the COVID-19 declaration under Section 564(b)(1) of the Act, 21 U.S.C.section 360bbb-3(b)(1), unless the authorization is terminated  or revoked sooner.       Influenza A by PCR NEGATIVE NEGATIVE Final   Influenza B by PCR NEGATIVE NEGATIVE Final    Comment: (NOTE) The Xpert Xpress SARS-CoV-2/FLU/RSV plus assay is intended as an aid in the diagnosis of influenza from Nasopharyngeal swab specimens and should not be used as a sole basis for treatment. Nasal washings and aspirates are unacceptable for Xpert Xpress SARS-CoV-2/FLU/RSV testing.  Fact Sheet for Patients: BloggerCourse.com  Fact Sheet for Healthcare Providers: SeriousBroker.it  This test is not yet approved or cleared by the [place] FDA and has been authorized for detection and/or diagnosis of SARS-CoV-2 by FDA under an Emergency Use Authorization (EUA). This EUA will remain in effect  (meaning this test can be used) for the duration of the COVID-19 declaration under Section 564(b)(1) of the Act, 21 U.S.C. section 360bbb-3(b)(1), unless the authorization is terminated or revoked.  Performed at [facility], [address], [place], [place] [address]   Blood Culture (routine x 2)     Status: None   Collection Time: 07/10/21 11:04 PM   Specimen: BLOOD  Result Value Ref Range Status   Specimen Description BLOOD RIGHT ANTECUBITAL  Final   Special Requests   Final    BOTTLES DRAWN AEROBIC AND ANAEROBIC Blood Culture results may not be optimal due to an excessive volume of blood received in culture bottles   Culture   Final    NO GROWTH 5 DAYS Performed at [facility], [address], [place], [place] [address]    Report Status 07/16/2021 FINAL  Final  Urine Culture     Status: Abnormal   Collection Time: 07/11/21  1:17 AM   Specimen: Urine, Random  Result Value Ref Range Status   Specimen Description URINE, RANDOM  Final   Special Requests NONE  Final   Culture (A)  Final    <10,000 COLONIES/mL INSIGNIFICANT GROWTH Performed at [facility], [address], [place], [place] [address]    Report Status 07/12/2021 FINAL  Final  MRSA Next Gen by PCR, Nasal     Status: Abnormal   Collection Time: 07/11/21  6:01 PM   Specimen: Nasal Mucosa; Nasal Swab  Result Value Ref Range Status   MRSA by PCR Next Gen DETECTED (A) NOT DETECTED Final    Comment: RESULT CALLED TO, READ BACK BY AND VERIFIED WITH: E [facility] RN 2108 07/11/21 A BROWNING (NOTE) The GeneXpert MRSA Assay (FDA approved for NASAL specimens only), is one component of a comprehensive MRSA colonization surveillance program. It is not intended to diagnose MRSA infection nor to guide or monitor treatment for MRSA infections. Test performance is not FDA approved in patients less than 59 years old. Performed at [facility], [address] [place], [place] [address]            Radiology Studies: No results found.      Scheduled Meds:  amLODipine  10 mg Oral Daily   aspirin  81 mg Oral Daily   atorvastatin  40 mg Oral Daily   budesonide (PULMICORT) nebulizer solution  0.25 mg Nebulization BID   Chlorhexidine Gluconate Cloth  6 each Topical Daily   DULoxetine  30 mg Oral q AM   empagliflozin  10 mg Oral Daily   enoxaparin (LOVENOX) injection  100 mg Subcutaneous Q24H   ferrous sulfate  325 mg Oral BID WC   fluconazole  100 mg Oral Daily   furosemide  80 mg Intravenous BID   gabapentin  600 mg Oral TID   hydrALAZINE  100 mg Oral Q8H   insulin aspart  0-5 Units Subcutaneous QHS   insulin aspart  0-9 Units Subcutaneous TID WC   insulin aspart  40 Units Subcutaneous TID WC   insulin glargine-yfgn  20 Units Subcutaneous QHS   ipratropium-albuterol  3 mL Nebulization BID   isosorbide mononitrate  60 mg Oral Daily   metoprolol tartrate  100 mg Oral BID   nystatin   Topical BID   sodium chloride flush  10-40 mL Intracatheter Q12H   tamsulosin  0.4 mg Oral Daily   Continuous Infusions:  sodium chloride 10 mL/hr at 07/16/21 2153     LOS: 10 days        Kathlen Mody, MD [facility]   To contact the attending provider between 7A-7P or the covering provider during after hours 7P-7A, please log  into the web site [email or web address] and access using universal [place] password for that web site. If you do not have the password, please call the hospital operator.  07/20/2021, 2:52 PM

## 2021-07-21 DIAGNOSIS — J9601 Acute respiratory failure with hypoxia: Secondary | ICD-10-CM

## 2021-07-21 DIAGNOSIS — I25119 Atherosclerotic heart disease of native coronary artery with unspecified angina pectoris: Secondary | ICD-10-CM | POA: Diagnosis not present

## 2021-07-21 DIAGNOSIS — I5033 Acute on chronic diastolic (congestive) heart failure: Secondary | ICD-10-CM | POA: Diagnosis not present

## 2021-07-21 DIAGNOSIS — G4733 Obstructive sleep apnea (adult) (pediatric): Secondary | ICD-10-CM | POA: Diagnosis not present

## 2021-07-21 DIAGNOSIS — E782 Mixed hyperlipidemia: Secondary | ICD-10-CM | POA: Diagnosis not present

## 2021-07-21 DIAGNOSIS — I10 Essential (primary) hypertension: Secondary | ICD-10-CM | POA: Diagnosis not present

## 2021-07-21 DIAGNOSIS — J189 Pneumonia, unspecified organism: Secondary | ICD-10-CM | POA: Diagnosis not present

## 2021-07-21 LAB — CBC
HCT: 27.2 % — ABNORMAL LOW (ref 36.0–46.0)
Hemoglobin: 8.1 g/dL — ABNORMAL LOW (ref 12.0–15.0)
MCH: 25.9 pg — ABNORMAL LOW (ref 26.0–34.0)
MCHC: 29.8 g/dL — ABNORMAL LOW (ref 30.0–36.0)
MCV: 86.9 fL (ref 80.0–100.0)
Platelets: 373 10*3/uL (ref 150–400)
RBC: 3.13 MIL/uL — ABNORMAL LOW (ref 3.87–5.11)
RDW: 14.6 % (ref 11.5–15.5)
WBC: 6.9 10*3/uL (ref 4.0–10.5)
nRBC: 0.3 % — ABNORMAL HIGH (ref 0.0–0.2)

## 2021-07-21 LAB — GLUCOSE, CAPILLARY
Glucose-Capillary: 146 mg/dL — ABNORMAL HIGH (ref 70–99)
Glucose-Capillary: 223 mg/dL — ABNORMAL HIGH (ref 70–99)
Glucose-Capillary: 128 mg/dL — ABNORMAL HIGH (ref 70–99)
Glucose-Capillary: 164 mg/dL — ABNORMAL HIGH (ref 70–99)

## 2021-07-21 LAB — BASIC METABOLIC PANEL
Anion gap: 8 (ref 5–15)
BUN: 33 mg/dL — ABNORMAL HIGH (ref 6–20)
CO2: 31 mmol/L (ref 22–32)
Calcium: 8.5 mg/dL — ABNORMAL LOW (ref 8.9–10.3)
Chloride: 97 mmol/L — ABNORMAL LOW (ref 98–111)
Creatinine, Ser: 1.16 mg/dL — ABNORMAL HIGH (ref 0.44–1.00)
GFR, Estimated: >60 mL/min (ref >60)
Glucose, Bld: 177 mg/dL — ABNORMAL HIGH (ref 70–99)
Potassium: 3.9 mmol/L (ref 3.5–5.1)
Sodium: 136 mmol/L (ref 135–145)

## 2021-07-21 MED ORDER — POTASSIUM CHLORIDE CRYS ER 20 MEQ PO TBCR
40.0000 meq | EXTENDED_RELEASE_TABLET | Freq: Once | ORAL | Status: AC
  Administered 2021-07-21: 40 meq via ORAL
  Filled 2021-07-21: qty 2

## 2021-07-21 MED ORDER — POLYETHYLENE GLYCOL 3350 17 G PO PACK
17.0000 g | PACK | Freq: Every day | ORAL | Status: DC | PRN
  Filled 2021-07-21: qty 1

## 2021-07-21 NOTE — Progress Notes (Signed)
PROGRESS NOTE    Lindsay [name]  UXN:[id number] DOB: [birth year] DOA: 07/10/2021 PCP: Grayce Sessions, NP    Chief Complaint  Patient presents with   Shortness of Breath    Brief Narrative:  Lindsay [name] is a 40 y.o. female with medical history significant of acute renal failure in 2016, asthma, RUE cellulitis, history of CAD, history of NSTEMI in 2017/status post angioplasty with stent, insulin requiring type II DM, hyperlipidemia, hypertension who is brought to the emergency department via EMS due to progressively worsening dyspnea since 07/09/2021 evening that did not respond to her home albuterol, along with fever, was diagnosed with CAP along with acute on chronic diastolic CHF and admitted to the hospital. She completed the course of antibiotics for CAP. She is currently being diuresed for diastolic CHF with high doses of lasix. Cardiology on board.   Assessment & Plan:   Principal Problem:   CAP (community acquired pneumonia) Active Problems:   Essential hypertension   DM neuropathy, type II diabetes mellitus (HCC)   Coronary artery disease involving native coronary artery of native heart with angina pectoris (HCC)   OSA (obstructive sleep apnea)   Hyperlipidemia   Normocytic anemia   Acute respiratory failure with hypoxia (HCC)   Acute on chronic diastolic heart failure (HCC)   Acute respiratory failure with hypoxia probably secondary to a combination of community-acquired pneumonia and acute on chronic diastolic heart here, in the setting of OHS and obstructive sleep apnea. Last echocardiogram showed preserved left ventricular ejection fraction.  Completed the course of IV antibiotics.  Continue with BiPAP at night, and during the day she is on [place] oxygen. She was on 7 lit of [place] oxygen, weaned her down to 3 lit of  oxygen.   CXR shows features consistent with CHF She will probably need oxygen on discharge.  VBG results PH-7.42, PCO2-43.2, PO2-147,  HCO3-28.4. Ambulating oxygen levels checked and she is requiring 3l it /min.  Will need to repeat ABG today.    Acute on chronic diastolic heart failure  IV lasix increased from 20 BID to 80 mg BID with good diuresis.  She has diuresed about  19.5 L since admission.  Cardiology on board and appreciate recommendations.  Renal parameters appears to be stable while on Lasix.   Continue with strict intake and output and daily weights.  Plan to transition to oral lasix in am.     CAD S/P PCI in 2021. Pt currently denies any chest pain.  Continue with aspirin, bb and statin.    Hypertension BP parameters are optimal.  Pt on metoprolol 100 mg BID, imdur 60 mg daily, Hydralazine 100 mg TID, amlodipine 10 mg daily.    Dyslipidemia:  Resume statin.    Morbid obesity .  Body mass index is 69.75 kg/m. Has underlying OSA and OHS.  Has CPAP at home, while in the hospital she is using BIPAP. Will probably need trilogy on discharge.    Mild AKI  Creatinine on admission was 0.88 , worsened to 1.4 and improved to 1.1 with diuresis.  Continue to monitor.  Creatinine stable on lasix.    Hyperkalemia:  Resolved   Urinary retention:  S/p foley. Will probably need voiding trial after we change from IV lasix to oral lasix.  Continue with flomax.    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 CBGS are better controlled today.  No changes in medications at this time.   Anemia of chronic disease:  Hemoglobin last year around 27. Admitted with a hemoglobin of 9.7, current at 8.4.  get stool for occult blood.  Anemia  panel revealed low iron levels.  Iron supplementation will be added on discharge  Left upper extremity swelling: post PICC line placement.  Ordered venous duplex of the left upper extremity to evaluate for DVT, which is  negative.    Hyponatremia:  Resolved with diuresis.  DVT prophylaxis: (Lovenox) Code Status: FULL CODE.  Family Communication: (none at bedside.  Disposition:   Status is: Inpatient  Remains inpatient appropriate because:Ongoing diagnostic testing needed not appropriate for outpatient work up and IV treatments appropriate due to intensity of illness or inability to take PO  Dispo: The patient is from: Home              Anticipated d/c is to: Home              Patient currently is not medically stable to d/c.   Difficult to place patient No       Consultants:  NONE.  Procedures: none.  Antimicrobials:  Antibiotics Given (last 72 hours)     None         Subjective: No new complaints.   Objective: Vitals:   07/21/21 0655 07/21/21 0734 07/21/21 0800 07/21/21 1407  BP:    (!) 116/56  Pulse:  62 61 71  Resp:  18 12   Temp:    98.3 F (36.8 C)  TempSrc:    Oral  SpO2: 98% 100% 100%   Weight:      Height:        Intake/Output Summary (Last 24 hours) at 07/21/2021 1832 Last data filed at 07/21/2021 1131 Gross per 24 hour  Intake 500 ml  Output 2475 ml  Net -1975 ml    Filed Weights   07/19/21 0500 07/20/21 0430 07/21/21 0310  Weight: (!) 206 kg (!) 205.9 kg (!) 202 kg    Examination:  General exam: Morbidly obese young woman not in any kind of distress, on 3 L of nasal cannula oxygen. Respiratory system: Diminished air entry at bases, no wheezing or rhonchi Cardiovascular system: Regular rate rhythm, pedal edema and upper extremity edema improving JVD cannot be appreciated Gastrointestinal system: Abdomen is soft, nontender bowel sounds normal Central nervous system: Alert and oriented, grossly nonfocal Extremities: LE edema is  improving Skin: No rashes seen Psychiatry: Mood is appropriate    Data Reviewed: I have personally reviewed following labs and imaging studies  CBC: Recent Labs  Lab 07/15/21 0029 07/16/21 0114 07/16/21 1602 07/17/21 0610 07/18/21 0740 07/21/21 0326  WBC 9.9 8.8  --  7.3 6.9 6.9  NEUTROABS 6.6 6.3  --  4.8 4.4  --   HGB 9.0* 9.2* 8.8* 8.4* 8.6* 8.1*  HCT 29.9* 30.3* 26.0* 27.3* 27.6* 27.2*  MCV 86.2 86.1  --  86.1 85.4 86.9  PLT 303 295  --  302 314 373     Basic Metabolic Panel: Recent Labs  Lab 07/15/21 0029 07/16/21 0114 07/17/21 0610 07/18/21 0740 07/19/21 1005 07/20/21 0210 07/21/21 0326  NA 130*   < >  130* 129* 130* 137 136  K 5.2*   < > 5.0 4.9 4.4 4.1 3.9  CL 97*   < > 96* 95* 95* 97* 97*  CO2 25   < > 28 28 28  32 31  GLUCOSE 234*   < > 150* 196* 170* 112* 177*  BUN 32*   < > 40* 45* 41* 37* 33*  CREATININE 1.31*   < > 1.32* 1.34* 1.19* 1.13* 1.16*  CALCIUM 8.6*   < > 8.5* 8.5* 8.5* 8.7* 8.5*  MG 2.2  --   --   --   --   --   --    < > = values in this interval not displayed.     GFR: Estimated Creatinine Clearance: 121.1 mL/min (A) (by C-G formula based on SCr of 1.16 mg/dL (H)).  Liver Function Tests: Recent Labs  Lab 07/15/21 0029 07/16/21 0114 07/17/21 0610  AST 26 28 26   ALT 29 31 29   ALKPHOS 86 85 76  BILITOT 0.6 0.6 0.3  PROT 7.2 7.5 6.8  ALBUMIN 2.9* 2.9* 2.8*     CBG: Recent Labs  Lab 07/20/21 1619 07/20/21 2108 07/21/21 0728 07/21/21 1152 07/21/21 1631  GLUCAP 191* 198* 146* 223* 128*      No results found for this or any previous visit (from the past 240 hour(s)).         Radiology Studies: No results found.      Scheduled Meds:  amLODipine  10 mg Oral Daily   aspirin  81 mg Oral Daily   atorvastatin  40 mg Oral Daily   budesonide (PULMICORT) nebulizer solution  0.25 mg Nebulization BID   Chlorhexidine Gluconate Cloth  6 each Topical Daily   DULoxetine  30 mg Oral q AM   empagliflozin  10 mg Oral  Daily   enoxaparin (LOVENOX) injection  100 mg Subcutaneous Q24H   ferrous sulfate  325 mg Oral BID WC   fluconazole  100 mg Oral Daily   furosemide  80 mg Intravenous BID   gabapentin  600 mg Oral TID   hydrALAZINE  100 mg Oral Q8H   insulin aspart  0-5 Units Subcutaneous QHS   insulin aspart  0-9 Units Subcutaneous TID WC   insulin aspart  40 Units Subcutaneous TID WC   insulin glargine-yfgn  20 Units Subcutaneous QHS   ipratropium-albuterol  3 mL Nebulization BID   isosorbide mononitrate  60 mg Oral Daily   metoprolol tartrate  100 mg Oral BID   nystatin   Topical BID   sodium chloride flush  10-40 mL Intracatheter Q12H   tamsulosin  0.4 mg Oral Daily   Continuous Infusions:  sodium chloride 10 mL/hr at 07/16/21 2153     LOS: 11 days        09/20/21, MD [facility]   To contact the attending provider between 7A-7P or the covering provider during after hours 7P-7A, please log into the web site [email or web address] and access using universal [place] password for that web site. If you do not have the password, please call the hospital operator.  07/21/2021, 6:32 PM

## 2021-07-21 NOTE — Progress Notes (Signed)
Occupational Therapy Treatment Patient Details Name: Lindsay [name] MRN: [id number] DOB: 11-13-1981 Today's Date: 07/21/2021    History of present illness Pt is a 40 y.o. female who presented 07/10/21 with fever and progressively worsening dyspnea since 07/09/21 that did not respond to her home albuterol. Pt admitted with community acquired pneumonia along with acute on chronic diastolic CHF. PMH: acute renal failure in 2016, asthma, RUE cellulitis, history of CAD, history of NSTEMI in 2017/status post angioplasty with stent, insulin requiring type II DM, hyperlipidemia, hypertension , obesity   OT comments  Pt. Was seen for skilled OT to maximize I and safety with ADLs and transfers. Pt. Was ed on AE  For LE ADLs and required cues and assist for technique. Pt. Ed on safety with transfers with use of walker.   Follow Up Recommendations  No OT follow up    Equipment Recommendations  Tub/shower seat;Other (comment)    Recommendations for Other Services      Precautions / Restrictions Precautions Precautions: Fall Precaution Comments: monitor SpO2 Restrictions Weight Bearing Restrictions: No       Mobility Bed Mobility Overal bed mobility: Modified Independent             General bed mobility comments: head of bed elevated    Transfers Overall transfer level: Needs assistance Equipment used: Rolling walker (2 wheeled) Transfers: Sit to/from Stand Sit to Stand: Min guard;From elevated surface Stand pivot transfers: Min guard       General transfer comment: Min G for safety    Balance     Sitting balance-Leahy Scale: Good       Standing balance-Leahy Scale: Fair                             ADL either performed or assessed with clinical judgement   ADL Overall ADL's : Needs assistance/impaired Eating/Feeding: Independent   Grooming: Min guard;Wash/dry hands;Wash/dry face           Upper Body Dressing : Set up;Sitting   Lower Body  Dressing: Set up;Min guard;Sit to/from stand;With adaptive equipment   Toilet Transfer: Min guard;RW   Toileting- Clothing Manipulation and Hygiene:  (Pt. ed on use of toilet aid to increase pt. ability to preform task.)       Functional mobility during ADLs: Min guard;Rolling walker General ADL Comments: Pt. ed on ae for LE ADLs and toilet aid.     Vision   Vision Assessment?: No apparent visual deficits Additional Comments: pt. wears glasses and is supposed to have b cataract sx next week.   Perception     Praxis      Cognition Arousal/Alertness: Awake/alert Behavior During Therapy: WFL for tasks assessed/performed Overall Cognitive Status: Within Functional Limits for tasks assessed                                          Exercises     Shoulder Instructions       General Comments      Pertinent Vitals/ Pain       Pain Assessment: 0-10 Pain Score: 7  Pain Location: back Pain Descriptors / Indicators: Aching Pain Intervention(s):  (Pt. states she does not need it right now.)  Home Living  Prior Functioning/Environment              Frequency  Min 2X/week        Progress Toward Goals  OT Goals(current goals can now be found in the care plan section)  Progress towards OT goals: Progressing toward goals  Acute Rehab OT Goals Patient Stated Goal: to get better OT Goal Formulation: With patient Time For Goal Achievement: 07/30/21 Potential to Achieve Goals: Good ADL Goals Pt Will Perform Lower Body Dressing: with modified independence;sit to/from stand;sitting/lateral leans Pt Will Transfer to Toilet: with modified independence;ambulating Pt Will Perform Toileting - Clothing Manipulation and hygiene: with modified independence;sit to/from stand;sitting/lateral leans;with adaptive equipment Additional ADL Goal #1: Patient will verbalize and demonstrate as needed understanding  of energy conservation strategies in order to safely participate in daily care routine.  Plan Discharge plan remains appropriate;Frequency remains appropriate    Co-evaluation                 AM-PAC OT "6 Clicks" Daily Activity     Outcome Measure   Help from another person eating meals?: None Help from another person taking care of personal grooming?: A Little Help from another person toileting, which includes using toliet, bedpan, or urinal?: A Little Help from another person bathing (including washing, rinsing, drying)?: A Little Help from another person to put on and taking off regular upper body clothing?: A Little Help from another person to put on and taking off regular lower body clothing?: A Little 6 Click Score: 19    End of Session Equipment Utilized During Treatment: Oxygen;Rolling walker  OT Visit Diagnosis: Unsteadiness on feet (R26.81);Other abnormalities of gait and mobility (R26.89);Muscle weakness (generalized) (M62.81)   Activity Tolerance Patient tolerated treatment well   Patient Left in chair;with call bell/phone within reach   Nurse Communication  (ok therapy)        Time: 4540-9811 OT Time Calculation (min): 47 min  Charges: OT General Charges $OT Visit: 1 Visit OT Treatments $Self Care/Home Management : 23-37 mins $Therapeutic Activity: 8-22 mins  Derrek Gu OT/L  Deztinee Lohmeyer 07/21/2021, 12:02 PM

## 2021-07-21 NOTE — Progress Notes (Signed)
SATURATION QUALIFICATIONS: (This note is used to comply with regulatory documentation for home oxygen)  Patient Saturations on Room Air at Rest = 90%  Patient Saturations on Room Air while Ambulating = 85%  Patient Saturations on 3 Liters of oxygen while Ambulating = 91%  Please briefly explain why patient needs home oxygen:Pt requiring 3LO2 with activity to maintain saturation.  Duwan Adrian M,PT Acute Rehab Services [phone] (pager)

## 2021-07-21 NOTE — Progress Notes (Addendum)
Progress Note  Patient Name: Lindsay [name] Date of Encounter: 07/21/2021  [facility] Cardiologist: Lesleigh Noe, MD   Subjective   Patient states she is feeling overall improved, still feel SOB intermittently, has not been OOB ambulating, feels mild chest pressure intermittently, denied chest pain. She felt a little dizzy yesterday. She states her leg edema is better. She states her BP is usually 118/75 -130/80 at home. She is compliant with her CPAP at home.   Inpatient Medications    Scheduled Meds:  amLODipine  10 mg Oral Daily   aspirin  81 mg Oral Daily   atorvastatin  40 mg Oral Daily   budesonide (PULMICORT) nebulizer solution  0.25 mg Nebulization BID   Chlorhexidine Gluconate Cloth  6 each Topical Daily   DULoxetine  30 mg Oral q AM   empagliflozin  10 mg Oral Daily   enoxaparin (LOVENOX) injection  100 mg Subcutaneous Q24H   ferrous sulfate  325 mg Oral BID WC   fluconazole  100 mg Oral Daily   furosemide  80 mg Intravenous BID   gabapentin  600 mg Oral TID   hydrALAZINE  100 mg Oral Q8H   insulin aspart  0-5 Units Subcutaneous QHS   insulin aspart  0-9 Units Subcutaneous TID WC   insulin aspart  40 Units Subcutaneous TID WC   insulin glargine-yfgn  20 Units Subcutaneous QHS   ipratropium-albuterol  3 mL Nebulization BID   isosorbide mononitrate  60 mg Oral Daily   metoprolol tartrate  100 mg Oral BID   nystatin   Topical BID   sodium chloride flush  10-40 mL Intracatheter Q12H   tamsulosin  0.4 mg Oral Daily   Continuous Infusions:  sodium chloride 10 mL/hr at 07/16/21 2153   PRN Meds: sodium chloride, albuterol, aspirin-acetaminophen-caffeine, baclofen, hydrALAZINE, morphine injection, nitroGLYCERIN, [DISCONTINUED] ondansetron **OR** ondansetron (ZOFRAN) IV, sodium chloride flush   Vital Signs    Vitals:   07/21/21 0310 07/21/21 0655 07/21/21 0734 07/21/21 0800  BP:      Pulse:   62 61  Resp:   18 12  Temp:      TempSrc:      SpO2:  98%  100% 100%  Weight: (!) 202 kg     Height:        Intake/Output Summary (Last 24 hours) at 07/21/2021 0811 Last data filed at 07/21/2021 0653 Gross per 24 hour  Intake 1340 ml  Output 4450 ml  Net -3110 ml   Last 3 Weights 07/21/2021 07/20/2021 07/19/2021  Weight (lbs) 445 lb 5.3 oz 454 lb 454 lb 2.4 oz  Weight (kg) 202 kg 205.933 kg 206 kg      Telemetry    Sinus rhythm with ventricular rate of 60-70s, artifacts - Personally Reviewed  ECG    N/A this AM  - Personally Reviewed  Physical Exam   GEN: No acute distress.   Neck: Neck short and thick, difficult to assess JVD Cardiac: RRR, no event murmurs, heart sound faint, difficult ascultation due to body habitus  Respiratory: Clear to auscultation bilaterally, diminished at base bilaterally, on 1lNC, pox 100%  GI: Abdomen obese, soft, nontender, non-distended  MS: BLE 1+ edema; No deformity. Neuro:  Alert and oriented x3 Psych: Normal affect   Labs    High Sensitivity Troponin:   Recent Labs  Lab 07/10/21 2300 07/11/21 0614 07/11/21 1656 07/11/21 2111 07/12/21 1723  TROPONINIHS 18* 13 9 9 9       Chemistry  Recent Labs  Lab 07/15/21 0029 07/16/21 0114 07/16/21 1602 07/17/21 0610 07/18/21 0740 07/19/21 1005 07/20/21 0210 07/21/21 0326  NA 130* 129*   < > 130*   < > 130* 137 136  K 5.2* 4.9   < > 5.0   < > 4.4 4.1 3.9  CL 97* 95*  --  96*   < > 95* 97* 97*  CO2 25 26  --  28   < > 28 32 31  GLUCOSE 234* 195*  --  150*   < > 170* 112* 177*  BUN 32* 38*  --  40*   < > 41* 37* 33*  CREATININE 1.31* 1.39*  --  1.32*   < > 1.19* 1.13* 1.16*  CALCIUM 8.6* 8.7*  --  8.5*   < > 8.5* 8.7* 8.5*  PROT 7.2 7.5  --  6.8  --   --   --   --   ALBUMIN 2.9* 2.9*  --  2.8*  --   --   --   --   AST 26 28  --  26  --   --   --   --   ALT 29 31  --  29  --   --   --   --   ALKPHOS 86 85  --  76  --   --   --   --   BILITOT 0.6 0.6  --  0.3  --   --   --   --   GFRNONAA 53* 50*  --  53*   < > 60* >60 >60  ANIONGAP 8 8  --  6    < > 7 8 8    < > = values in this interval not displayed.     Hematology Recent Labs  Lab 07/17/21 0610 07/17/21 1746 07/18/21 0740 07/21/21 0326  WBC 7.3  --  6.9 6.9  RBC 3.17* 3.33* 3.23* 3.13*  HGB 8.4*  --  8.6* 8.1*  HCT 27.3*  --  27.6* 27.2*  MCV 86.1  --  85.4 86.9  MCH 26.5  --  26.6 25.9*  MCHC 30.8  --  31.2 29.8*  RDW 14.3  --  14.3 14.6  PLT 302  --  314 373    BNP Recent Labs  Lab 07/15/21 0029 07/16/21 0114 07/17/21 0610  BNP 148.1* 179.8* 190.7*     DDimer  Recent Labs  Lab 07/15/21 0029  DDIMER 1.35*     Radiology    No results found.  Cardiac Studies   Echo from 07/12/21:   1. Left ventricular ejection fraction, by estimation, is 65 to 70%. The  left ventricle has normal function. Left ventricular endocardial border  not optimally defined to evaluate regional wall motion. There is mild left  ventricular hypertrophy. Left  ventricular diastolic parameters were normal.   2. Right ventricular systolic function is normal. The right ventricular  size is normal. There is normal pulmonary artery systolic pressure. The  estimated right ventricular systolic pressure is 18.7 mmHg.   3. The mitral valve is grossly normal. Trivial mitral valve  regurgitation.   4. The aortic valve is tricuspid. Aortic valve regurgitation is not  visualized.   5. The inferior vena cava is normal in size with greater than 50%  respiratory variability, suggesting right atrial pressure of 3 mmHg.  LHC on 04/25/2020:  LAD stent is widely patent with perhaps eccentric 20% narrowing in some views.  The jailed second  diagonal is widely patent.  Proximal first diagonal contains ostial to proximal segmental 60% narrowing Normal left main Moderate irregularities in the circumflex up to 40% in the first marginal. Dominant right coronary, luminal irregularities. Normal LV function with EF 60%.  Normal LVEDP.   RECOMMENDATIONS:   Continue aggressive risk factor modification  including greater than 150 minutes of moderate activity 5 out of 7 days of the week, weight loss, LDL less than 70, blood pressure control, and management of potential sleep apnea. Chest pain and dyspnea do not appear to be related to obstructive disease or heart failure respectively.  Other explanations should be sought.     Patient Profile     40 y.o. female with PMH of CAD (NSTEMI in 9/ 2017 s/p DES to LAD and POBA of D2, EF 35 to 45%), ischemic cardiomyopathy with recovered EF,  asthma, insulin requiring type II DM, hyperlipidemia, hypertension, OSA, morbid obesity, cardiology is following for decompensated CHF.   Assessment & Plan    Acute hypoxic respiratory failure  -Appears multifactorial from CHF decompensation, community-acquired pneumonia, underlying OSA and obesity hypoventilation syndrome -Remains 3 L nasal cannula oxygen support, wean as tolerated  Acute diastolic heart failure  -Presented with 24 hours onset of SOB, cough, fever, chills and 07/10/2021, found to be tachypneic with borderline hypoxic 90%, hypertensive 159/71, febrile 101.1 at admission - CXR showed interval development of diffuse interstitial infiltrate representing atypical infection versus edema, POA - BNP was 59 POA, this was trended and 190 on 07/17/21 - Echocardiogram completed 07/12/2021 revealed EF 65 to 70%, difficult assess regional wall motion, normal PASP, trivial MR -Urine output 4.4 L over the past 24 hours, net -18 L since admission -Weight 437 >458>445 ibs since admission - Creatinine on admission was 0.88 , worsened to 1.4 and improved to 1.1 ranges now, renal index stable while getting diuresis at this time  - On IV Lasix 80mg  BID currently, was not on loop diuretic at home - clinically hypervolemic  - recommend continue IV diuresis at current dosing  - Continue monitor strict intake and output, daily weight, daily electrolytes  - GDMT: Continue metoprolol 100 mg twice daily and losartan 100 mg  daily and Jardiance 10 mg daily  CAD with history of PCI in 2017 -No acute anginal symptoms -Continue medical therapy with aspirin 81 mg, Lipitor 40 mg, metoprolol 100 mg twice daily, Imdur 60 mg daily, amlodipine 10 mg daily,  Hypertension -Requiring multiple antihypertensive including amlodipine 10 mg daily, metoprolol 100 mg twice daily, Imdur 60 mg daily, IV Lasix 80 mg twice daily, hydralazine 100 mg 3 times daily - BP is adequately controlled  Hyperlipidemia - Continue Lipitor 40 mg daily - LDL 97 from 12/05/20, will update lipid panel   Community-acquired pneumonia OSA Type 2 diabetes Morbid Obesity Urinary retention   Anemia of chronic disease + IDA - managed per IM        For questions or updates, please contact [facility] Please consult [email or web address] for contact info under        Signed, 12/07/20, NP  07/21/2021, 8:11 AM

## 2021-07-21 NOTE — Progress Notes (Signed)
Physical Therapy Treatment Patient Details Name: Lindsay [name] MRN: [id number] DOB: Feb 22, 1981 Today's Date: 07/21/2021    History of Present Illness Pt is a 40 y.o. female who presented 07/10/21 with fever and progressively worsening dyspnea since 07/09/21 that did not respond to her home albuterol. Pt admitted with community acquired pneumonia along with acute on chronic diastolic CHF. PMH: acute renal failure in 2016, asthma, RUE cellulitis, history of CAD, history of NSTEMI in 2017/status post angioplasty with stent, insulin requiring type II DM, hyperlipidemia, hypertension , obesity    PT Comments    Pt admitted with above diagnosis. Pt progressed distance with ambulation today. Pt very safe with rollator. Desaturation on RA with activity but not at rest.  Will continue PT.  Pt currently with functional limitations due to balance and endurance deficits. Pt will benefit from skilled PT to increase their independence and safety with mobility to allow discharge to the venue listed below.      Follow Up Recommendations  Home health PT;Supervision for mobility/OOB     Equipment Recommendations  None recommended by PT    Recommendations for Other Services       Precautions / Restrictions Precautions Precautions: Fall Precaution Comments: monitor SpO2 Restrictions Weight Bearing Restrictions: No    Mobility  Bed Mobility Overal bed mobility: Modified Independent             General bed mobility comments: head of bed elevated    Transfers Overall transfer level: Needs assistance Equipment used: Rolling walker (2 wheeled) Transfers: Sit to/from Stand Sit to Stand: Min guard;From elevated surface Stand pivot transfers: Min guard       General transfer comment: Min G for safety  Ambulation/Gait Ambulation/Gait assistance: Min guard Gait Distance (Feet): 350 Feet Assistive device: 4-wheeled walker Gait Pattern/deviations: Step-through pattern;Decreased stride  length;Shuffle;Wide base of support Gait velocity: decr Gait velocity interpretation: <1.31 ft/sec, indicative of household ambulator General Gait Details: Assist for safety/lines but did not need the chair follow today.Pt was able to judge when to stop and rest sitting on rollator and when to continue.Pt took 3 seated rest breaks on rollator with good technique to lock brakes.   Stairs             Wheelchair Mobility    Modified Rankin (Stroke Patients Only)       Balance Overall balance assessment: Needs assistance Sitting-balance support: No upper extremity supported;Feet supported Sitting balance-Leahy Scale: Good Sitting balance - Comments: Static sitting EOB with supervision.   Standing balance support: Bilateral upper extremity supported;During functional activity Standing balance-Leahy Scale: Fair Standing balance comment: walker and supervision for static standing                            Cognition Arousal/Alertness: Awake/alert Behavior During Therapy: WFL for tasks assessed/performed Overall Cognitive Status: Within Functional Limits for tasks assessed                                 General Comments: Pt follows commands appropriately and is able to identify when she needs to rest.      Exercises      General Comments General comments (skin integrity, edema, etc.): Pt on 3L on arrival.  Took the O2 off and pt O2 sats >90% for up to 8 min.  When pt began to ambulate she began to desaturate and down as low as  85% therefore replaced O2 at 3L for ambulation to keep sats >90%.      Pertinent Vitals/Pain Pain Assessment: No/denies pain    Home Living                      Prior Function            PT Goals (current goals can now be found in the care plan section) Acute Rehab PT Goals Patient Stated Goal: to get better Progress towards PT goals: Progressing toward goals    Frequency    Min 3X/week      PT  Plan Current plan remains appropriate    Co-evaluation              AM-PAC PT "6 Clicks" Mobility   Outcome Measure  Help needed turning from your back to your side while in a flat bed without using bedrails?: None Help needed moving from lying on your back to sitting on the side of a flat bed without using bedrails?: None Help needed moving to and from a bed to a chair (including a wheelchair)?: A Little Help needed standing up from a chair using your arms (e.g., wheelchair or bedside chair)?: A Little Help needed to walk in hospital room?: A Little Help needed climbing 3-5 steps with a railing? : A Lot 6 Click Score: 19    End of Session Equipment Utilized During Treatment: Oxygen;Gait belt Activity Tolerance: Patient limited by fatigue Patient left: with call bell/phone within reach;in bed;with bed alarm set Nurse Communication: Mobility status PT Visit Diagnosis: Unsteadiness on feet (R26.81);Other abnormalities of gait and mobility (R26.89);Muscle weakness (generalized) (M62.81);Difficulty in walking, not elsewhere classified (R26.2)     Time: 4098-1191 PT Time Calculation (min) (ACUTE ONLY): 42 min  Charges:  $Gait Training: 38-52 mins                     Lindsay [name],PT Acute Rehab Services [phone] (pager)    Lindsay [name] 07/21/2021, 3:29 PM

## 2021-07-21 NOTE — TOC Progression Note (Signed)
Transition of Care [facility]) - Progression Note    Patient Details  Name: Lindsay [name] MRN: [id number] Date of Birth: 01/11/1981  [facility] CM/SW Contact  Graves-Bigelow, Lamar Laundry, RN Phone Number: 07/21/2021, 1:11 PM  Clinical Narrative: Case Manager spoke with patient and she is agreeable to home health PT via [facility]. [facility] can only accept the patient for PT with Medicaid. Office to call with visit times. Case Manager ordered patient a shower chair via [employer] and it will be delivered to the room prior to transition home. Patient has hospital follow up scheduled and information was placed on the AVS. No further needs identified via Case Manager at this time.     Expected Discharge Plan: Home w Home Health Services Barriers to Discharge: Continued Medical Work up  Expected Discharge Plan and Services Expected Discharge Plan: Home w Home Health Services In-house Referral: NA Discharge Planning Services: CM Consult Post Acute Care Choice: Durable Medical Equipment, Home Health Living arrangements for the past 2 months: Apartment                 DME Arranged: Shower stool DME Agency: [employer] Date DME Agency Contacted: 07/21/21 Time DME Agency Contacted: [phone] Representative spoke with at DME Agency: Velna Hatchet HH Arranged: PT HH Agency: [facility] Date HH Agency Contacted: 07/21/21 Time HH Agency Contacted: [phone] Representative spoke with at [facility] Agency: Alcario Drought  Readmission Risk Interventions No flowsheet data found.

## 2021-07-22 DIAGNOSIS — E782 Mixed hyperlipidemia: Secondary | ICD-10-CM | POA: Diagnosis not present

## 2021-07-22 DIAGNOSIS — J9601 Acute respiratory failure with hypoxia: Secondary | ICD-10-CM | POA: Diagnosis not present

## 2021-07-22 DIAGNOSIS — I25119 Atherosclerotic heart disease of native coronary artery with unspecified angina pectoris: Secondary | ICD-10-CM | POA: Diagnosis not present

## 2021-07-22 DIAGNOSIS — I10 Essential (primary) hypertension: Secondary | ICD-10-CM | POA: Diagnosis not present

## 2021-07-22 DIAGNOSIS — G4733 Obstructive sleep apnea (adult) (pediatric): Secondary | ICD-10-CM | POA: Diagnosis not present

## 2021-07-22 DIAGNOSIS — J189 Pneumonia, unspecified organism: Secondary | ICD-10-CM | POA: Diagnosis not present

## 2021-07-22 DIAGNOSIS — I5033 Acute on chronic diastolic (congestive) heart failure: Secondary | ICD-10-CM | POA: Diagnosis not present

## 2021-07-22 LAB — BLOOD GAS, ARTERIAL
Acid-Base Excess: 8 mmol/L — ABNORMAL HIGH (ref 0.0–2.0)
Bicarbonate: 32.6 mmol/L — ABNORMAL HIGH (ref 20.0–28.0)
FIO2: 21
O2 Saturation: 89.5 %
Patient temperature: 36.9
pCO2 arterial: 50 mmHg — ABNORMAL HIGH (ref 32.0–48.0)
pH, Arterial: 7.429 (ref 7.350–7.450)
pO2, Arterial: 57.4 mmHg — ABNORMAL LOW (ref 83.0–108.0)

## 2021-07-22 LAB — BASIC METABOLIC PANEL
Anion gap: 9 (ref 5–15)
BUN: 25 mg/dL — ABNORMAL HIGH (ref 6–20)
CO2: 34 mmol/L — ABNORMAL HIGH (ref 22–32)
Calcium: 9 mg/dL (ref 8.9–10.3)
Chloride: 96 mmol/L — ABNORMAL LOW (ref 98–111)
Creatinine, Ser: 1.1 mg/dL — ABNORMAL HIGH (ref 0.44–1.00)
GFR, Estimated: >60 mL/min (ref >60)
Glucose, Bld: 158 mg/dL — ABNORMAL HIGH (ref 70–99)
Potassium: 4.1 mmol/L (ref 3.5–5.1)
Sodium: 139 mmol/L (ref 135–145)

## 2021-07-22 LAB — GLUCOSE, CAPILLARY
Glucose-Capillary: 160 mg/dL — ABNORMAL HIGH (ref 70–99)
Glucose-Capillary: 233 mg/dL — ABNORMAL HIGH (ref 70–99)
Glucose-Capillary: 204 mg/dL — ABNORMAL HIGH (ref 70–99)
Glucose-Capillary: 170 mg/dL — ABNORMAL HIGH (ref 70–99)

## 2021-07-22 NOTE — Progress Notes (Signed)
PROGRESS NOTE    Lindsay [name]  NWG:[id number] DOB: 1981/09/17 DOA: 07/10/2021 PCP: Grayce Sessions, NP    Chief Complaint  Patient presents with   Shortness of Breath    Brief Narrative:  Lindsay [name] is a 40 y.o. female with medical history significant of acute renal failure in 2016, asthma, RUE cellulitis, history of CAD, history of NSTEMI in 2017/status post angioplasty with stent, insulin requiring type II DM, hyperlipidemia, hypertension who is brought to the emergency department via EMS due to progressively worsening dyspnea since 07/09/2021 evening that did not respond to her home albuterol, along with fever, was diagnosed with CAP along with acute on chronic diastolic CHF and admitted to the hospital. She completed the course of antibiotics for CAP. She is currently being diuresed for diastolic CHF with high doses of lasix. Cardiology on board.   Assessment & Plan:   Principal Problem:   CAP (community acquired pneumonia) Active Problems:   Essential hypertension   DM neuropathy, type II diabetes mellitus (HCC)   Coronary artery disease involving native coronary artery of native heart with angina pectoris (HCC)   OSA (obstructive sleep apnea)   Hyperlipidemia   Normocytic anemia   Acute respiratory failure with hypoxia (HCC)   Acute on chronic diastolic heart failure (HCC)   Acute respiratory failure with hypoxia probably secondary to a combination of community-acquired pneumonia and acute on chronic diastolic heart here, in the setting of OHS and obstructive sleep apnea. Last echocardiogram showed preserved left ventricular ejection fraction.  Completed the course of IV antibiotics.  Continue with BiPAP at night, and during the day she is on [place] oxygen. She was on 7 lit of [place] oxygen, weaned her down to 3 lit of [place] oxygen.   CXR shows features consistent with CHF She will probably need oxygen on discharge.  VBG results PH-7.42, PCO2-43.2, PO2-147, HCO3-28.4.  REPEAT ABG ordered to see if she will qualify for TRILOGY.  Ambulating oxygen levels checked and she is requiring 3l it /min.     Acute on chronic diastolic heart failure  IMPROVING.  IV lasix increased from 20 BID to 80 mg BID with good diuresis.  She has diuresed about  22.1 L since admission.  Cardiology on board and appreciate recommendations.  Renal parameters appears to be stable while on Lasix.   Continue with strict intake and output and daily weights.  Plan to transition to oral lasix in am.     CAD S/P PCI in 2021. No chest pain, sob improving.  Continue with aspirin, bb and statin.    Hypertension BP parameters are optimal.  Pt on metoprolol 100 mg BID, imdur 60 mg daily, Hydralazine 100 mg TID, amlodipine 10 mg daily.    Dyslipidemia:  Resume statin.    Morbid obesity .  Body mass index is 68.3 kg/m. Has underlying OSA and OHS.  Has CPAP at home, while in the hospital she is using BIPAP. Will probably need trilogy on discharge.  Repeat ABG ordered today.    Mild AKI  Creatinine on admission was 0.88 , worsened to 1.4 and improved to 1.1 with diuresis. Creatinine stable around 1.1. Continue to monitor.    Hyperkalemia:  Resolved   Urinary retention:  S/p foley. Will probably need voiding trial after we change from IV lasix to oral lasix.  Continue with flomax.    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 CBG's are well controlled today, no changes in meds.    Anemia of chronic disease:  Hemoglobin last year around 82. Admitted with a hemoglobin of 9.7, currently around 8 and  stable.  get stool for occult blood.  Anemia panel revealed low iron levels.  Iron supplementation will be added on discharge  Left upper extremity swelling: post PICC line placement.  Ordered venous duplex of the left upper extremity to evaluate for DVT, which is  negative.  Upper extremity edema is improving.    Hyponatremia:  Resolved with diuresis.  DVT prophylaxis: (Lovenox) Code Status: FULL CODE.  Family Communication: (none at bedside.  Disposition:   Status is: Inpatient  Remains inpatient appropriate because:Ongoing diagnostic testing needed not appropriate for outpatient work up and IV treatments appropriate due to intensity of illness or inability to take PO  Dispo: The patient is from: Home              Anticipated d/c is to: Home              Patient currently is not medically stable to d/c.   Difficult to place patient No       Consultants:  NONE.  Procedures: none.  Antimicrobials:  Antibiotics Given (last 72 hours)     None         Subjective: Breathing has improved.   Objective: Vitals:   07/22/21 0644 07/22/21 0726 07/22/21 0729 07/22/21 0846  BP:    129/68  Pulse:    68  Resp:    16  Temp:      TempSrc:      SpO2: 93% 94% 99% 92%  Weight:      Height:        Intake/Output Summary (Last 24 hours) at 07/22/2021 0944 Last data filed at 07/22/2021 0556 Gross per 24 hour  Intake 355 ml  Output 4175 ml  Net -3820 ml    Filed Weights   07/20/21 0430 07/21/21 0310 07/22/21 0559  Weight: (!) 205.9 kg (!) 202 kg (!) 197.8 kg    Examination:  General exam: Morbidly obese lady on 3 L of nasal cannula oxygen and appears comfortable Respiratory system: Air entry fair, no wheezing or rhonchi no Tachypnea Cardiovascular system: Regular rate rhythm, leg edema and upper extremity edema improving Gastrointestinal system: Abdomen is soft nontender bowel sounds normal Central nervous system: Alert and oriented, grossly nonfocal Extremities:  Lower extremity edema is improving Skin: No rashes seen Psychiatry: Mood is appropriate    Data Reviewed: I have personally reviewed following labs and imaging studies  CBC: Recent Labs  Lab 07/16/21 0114 07/16/21 1602 07/17/21 0610 07/18/21 0740 07/21/21 0326  WBC 8.8  --  7.3 6.9 6.9  NEUTROABS 6.3  --  4.8 4.4  --   HGB 9.2* 8.8* 8.4* 8.6* 8.1*  HCT 30.3* 26.0* 27.3* 27.6* 27.2*  MCV 86.1  --  86.1 85.4 86.9  PLT 295  --  302 314 373     Basic Metabolic Panel: Recent Labs  Lab 07/18/21 0740 07/19/21 1005 07/20/21 0210 07/21/21 0326 07/22/21 0539  NA 129* 130* 137 136 139  K 4.9  4.4 4.1 3.9 4.1  CL 95* 95* 97* 97* 96*  CO2 28 28 32 31 34*  GLUCOSE 196* 170* 112* 177* 158*  BUN 45* 41* 37* 33* 25*  CREATININE 1.34* 1.19* 1.13* 1.16* 1.10*  CALCIUM 8.5* 8.5* 8.7* 8.5* 9.0     GFR: Estimated Creatinine Clearance: 125.8 mL/min (A) (by C-G formula based on SCr of 1.1 mg/dL (H)).  Liver Function Tests: Recent Labs  Lab 07/16/21 0114 07/17/21 0610  AST 28 26  ALT 31 29  ALKPHOS 85 76  BILITOT 0.6 0.3  PROT 7.5 6.8  ALBUMIN 2.9* 2.8*     CBG: Recent Labs  Lab 07/21/21 0728 07/21/21 1152 07/21/21 1631 07/21/21 2145 07/22/21 0725  GLUCAP 146* 223* 128* 164* 160*      No results found for this or any previous visit (from the past 240 hour(s)).         Radiology Studies: No results found.      Scheduled Meds:  amLODipine  10 mg Oral Daily   aspirin  81 mg Oral Daily   atorvastatin  40 mg Oral Daily   budesonide (PULMICORT) nebulizer solution  0.25 mg Nebulization BID   Chlorhexidine Gluconate Cloth  6 each Topical Daily   DULoxetine  30 mg Oral q AM   empagliflozin  10 mg Oral Daily   enoxaparin (LOVENOX) injection  100 mg Subcutaneous Q24H   ferrous sulfate  325 mg Oral BID WC   fluconazole  100 mg Oral Daily   furosemide  80 mg Intravenous BID   gabapentin  600 mg Oral TID   hydrALAZINE  100 mg Oral Q8H   insulin aspart   0-5 Units Subcutaneous QHS   insulin aspart  0-9 Units Subcutaneous TID WC   insulin aspart  40 Units Subcutaneous TID WC   insulin glargine-yfgn  20 Units Subcutaneous QHS   ipratropium-albuterol  3 mL Nebulization BID   isosorbide mononitrate  60 mg Oral Daily   metoprolol tartrate  100 mg Oral BID   nystatin   Topical BID   sodium chloride flush  10-40 mL Intracatheter Q12H   tamsulosin  0.4 mg Oral Daily   Continuous Infusions:  sodium chloride 10 mL/hr at 07/16/21 2153     LOS: 12 days        Kathlen Mody, MD [facility]   To contact the attending provider between 7A-7P or the covering provider during after hours 7P-7A, please log into the web site [email or web address] and access using universal [place] password for that web site. If you do not have the password, please call the hospital operator.  07/22/2021, 9:44 AM

## 2021-07-22 NOTE — Progress Notes (Signed)
Patient has not had BM since Friday, per patient report, refusing miralax or any laxative at this time.  Educated on constipation and especially taking iron supplements, continued to refuse laxative.  Denies feeling constipated. Bowel sounds active, passing flatus.  Will continue to monitor.

## 2021-07-22 NOTE — Progress Notes (Addendum)
Progress Note  Patient Name: Lindsay [name] Date of Encounter: 07/22/2021  [facility] Cardiologist: Lesleigh Noe, MD   Subjective   Breathing and LE edema continue to improve. She notes a brief episode of chest pressure when she tried to recline the head of the bed last night which improved with sitting upright. Also has noticed some skin breakdown at her lower back which RN is aware of.   Inpatient Medications    Scheduled Meds:  amLODipine  10 mg Oral Daily   aspirin  81 mg Oral Daily   atorvastatin  40 mg Oral Daily   budesonide (PULMICORT) nebulizer solution  0.25 mg Nebulization BID   Chlorhexidine Gluconate Cloth  6 each Topical Daily   DULoxetine  30 mg Oral q AM   empagliflozin  10 mg Oral Daily   enoxaparin (LOVENOX) injection  100 mg Subcutaneous Q24H   ferrous sulfate  325 mg Oral BID WC   fluconazole  100 mg Oral Daily   furosemide  80 mg Intravenous BID   gabapentin  600 mg Oral TID   hydrALAZINE  100 mg Oral Q8H   insulin aspart  0-5 Units Subcutaneous QHS   insulin aspart  0-9 Units Subcutaneous TID WC   insulin aspart  40 Units Subcutaneous TID WC   insulin glargine-yfgn  20 Units Subcutaneous QHS   ipratropium-albuterol  3 mL Nebulization BID   isosorbide mononitrate  60 mg Oral Daily   metoprolol tartrate  100 mg Oral BID   nystatin   Topical BID   sodium chloride flush  10-40 mL Intracatheter Q12H   tamsulosin  0.4 mg Oral Daily   Continuous Infusions:  sodium chloride 10 mL/hr at 07/16/21 2153   PRN Meds: sodium chloride, albuterol, aspirin-acetaminophen-caffeine, baclofen, hydrALAZINE, morphine injection, nitroGLYCERIN, [DISCONTINUED] ondansetron **OR** ondansetron (ZOFRAN) IV, polyethylene glycol, sodium chloride flush   Vital Signs    Vitals:   07/22/21 0644 07/22/21 0726 07/22/21 0729 07/22/21 0846  BP:    129/68  Pulse:    68  Resp:    16  Temp:      TempSrc:      SpO2: 93% 94% 99% 92%  Weight:      Height:         Intake/Output Summary (Last 24 hours) at 07/22/2021 0853 Last data filed at 07/22/2021 0556 Gross per 24 hour  Intake 355 ml  Output 4175 ml  Net -3820 ml   Last 3 Weights 07/22/2021 07/21/2021 07/20/2021  Weight (lbs) 436 lb 1.6 oz 445 lb 5.3 oz 454 lb  Weight (kg) 197.814 kg 202 kg 205.933 kg      Telemetry    Sinus rhythm with occasional PVC - Personally Reviewed  ECG    No new tracing - Personally Reviewed  Physical Exam   GEN: sitting upright in bed eating breakfast in no acute distress.   Cardiac: RRR, no murmurs, rubs, or gallops.  Respiratory: Breath sounds diminished due to body habitus though overall clear to auscultation bilaterally without wheezing, rhonchi, or rales. GI: Soft, nontender, non-distended  MS: 1-2+ edema; No deformity. Neuro:  Nonfocal  Psych: Normal affect   Labs    High Sensitivity Troponin:   Recent Labs  Lab 07/10/21 2300 07/11/21 0614 07/11/21 1656 07/11/21 2111 07/12/21 1723  TROPONINIHS 18* 13 9 9 9       Chemistry Recent Labs  Lab 07/16/21 0114 07/16/21 1602 07/17/21 0610 07/18/21 0740 07/20/21 0210 07/21/21 0326 07/22/21 0539  NA 129*   < >  130*   < > 137 136 139  K 4.9   < > 5.0   < > 4.1 3.9 4.1  CL 95*  --  96*   < > 97* 97* 96*  CO2 26  --  28   < > 32 31 34*  GLUCOSE 195*  --  150*   < > 112* 177* 158*  BUN 38*  --  40*   < > 37* 33* 25*  CREATININE 1.39*  --  1.32*   < > 1.13* 1.16* 1.10*  CALCIUM 8.7*  --  8.5*   < > 8.7* 8.5* 9.0  PROT 7.5  --  6.8  --   --   --   --   ALBUMIN 2.9*  --  2.8*  --   --   --   --   AST 28  --  26  --   --   --   --   ALT 31  --  29  --   --   --   --   ALKPHOS 85  --  76  --   --   --   --   BILITOT 0.6  --  0.3  --   --   --   --   GFRNONAA 50*  --  53*   < > >60 >60 >60  ANIONGAP 8  --  6   < > 8 8 9    < > = values in this interval not displayed.     Hematology Recent Labs  Lab 07/17/21 0610 07/17/21 1746 07/18/21 0740 07/21/21 0326  WBC 7.3  --  6.9 6.9  RBC 3.17*  3.33* 3.23* 3.13*  HGB 8.4*  --  8.6* 8.1*  HCT 27.3*  --  27.6* 27.2*  MCV 86.1  --  85.4 86.9  MCH 26.5  --  26.6 25.9*  MCHC 30.8  --  31.2 29.8*  RDW 14.3  --  14.3 14.6  PLT 302  --  314 373    BNP Recent Labs  Lab 07/16/21 0114 07/17/21 0610  BNP 179.8* 190.7*     DDimer No results for input(s): DDIMER in the last 168 hours.   Radiology    No results found.  Cardiac Studies   Echo from 07/12/21:    1. Left ventricular ejection fraction, by estimation, is 65 to 70%. The  left ventricle has normal function. Left ventricular endocardial border  not optimally defined to evaluate regional wall motion. There is mild left  ventricular hypertrophy. Left  ventricular diastolic parameters were normal.   2. Right ventricular systolic function is normal. The right ventricular  size is normal. There is normal pulmonary artery systolic pressure. The  estimated right ventricular systolic pressure is 18.7 mmHg.   3. The mitral valve is grossly normal. Trivial mitral valve  regurgitation.   4. The aortic valve is tricuspid. Aortic valve regurgitation is not  visualized.   5. The inferior vena cava is normal in size with greater than 50%  respiratory variability, suggesting right atrial pressure of 3 mmHg.   LHC on 04/25/2020:   LAD stent is widely patent with perhaps eccentric 20% narrowing in some views.  The jailed second diagonal is widely patent.  Proximal first diagonal contains ostial to proximal segmental 60% narrowing Normal left main Moderate irregularities in the circumflex up to 40% in the first marginal. Dominant right coronary, luminal irregularities. Normal LV function with EF 60%.  Normal LVEDP.  RECOMMENDATIONS:   Continue aggressive risk factor modification including greater than 150 minutes of moderate activity 5 out of 7 days of the week, weight loss, LDL less than 70, blood pressure control, and management of potential sleep apnea. Chest pain and dyspnea  do not appear to be related to obstructive disease or heart failure respectively.  Other explanations should be sought.     Patient Profile     40 y.o. female with PMH of CAD (NSTEMI in 9/ 2017 s/p DES to LAD and POBA of D2, EF 35 to 45%), ischemic cardiomyopathy with recovered EF,  asthma, insulin requiring type II DM, hyperlipidemia, hypertension, OSA, morbid obesity, cardiology is following for decompensated CHF.   Assessment & Plan    1. Acute hypoxic respiratory failure: She presented with 24 hours onset of SOB, cough, fever, chills and 07/10/2021, found to be tachypneic with borderline hypoxic 90%, hypertensive 159/71, febrile 101.1 at admission. CXR showed interval development of diffuse interstitial infiltrate representing atypical infection versus edema, POA. Appears multifactorial from CHF decompensation, community-acquired pneumonia, underlying OSA and obesity hypoventilation syndrome. Weaned to RA this morning. She has completed a course of IV antibiotics. - Continue supportive care per primary team - Continue diuresis as below   2. Acute diastolic heart failure:  BNP elevated to 190 on 07/17/21. Echocardiogram completed 07/12/2021 revealed EF 65 to 70%, difficult assess regional wall motion, normal PASP, trivial MR. She has been diuresing with IV lasix with urine output -3.8 L over the past 24 hours, net -22.2L since admission. Weight peaked at 458lbs since admission, down to 436lbs today. Volume status continues to be difficult to determine due to body habitus. She still has 1-2+ LE edema on exam. She has weaned off O2 via [place] this morning. She received IV lasix this morning - Suspect she could continue IV lasix through today, then transition to po lasix tomorrow.  - Continue monitor strict intake and output, daily weight, daily electrolytes - GDMT: Continue metoprolol 100 mg twice daily and losartan 100 mg daily and Jardiance 10 mg daily   3. CAD s/p PCI/DES to LAD 2017: had brief  episode of chest pressure overnight when reclining in bed which was not c/w prior angina and resolved with sitting upright.  - Continue medical therapy with aspirin 81 mg, Lipitor 40 mg, metoprolol 100 mg twice daily, Imdur 60 mg daily, amlodipine 10 mg daily   4. Hypertension: BP stable this admission  - Continue amlodipine 10 mg daily, metoprolol 100 mg twice daily, Imdur 60 mg daily, IV Lasix 80 mg twice daily, hydralazine 100 mg 3 times daily   5. Hyperlipidemia: LDL 97 from 12/05/20 - Will check FLP in AM - low threshold to uptitrate lipitor of LDL >70 - Continue Lipitor 40 mg daily      For questions or updates, please contact [facility] Please consult [email or web address] for contact info under        Signed, Beatriz Stallion, PA-C  07/22/2021, 8:53 AM

## 2021-07-23 DIAGNOSIS — G4733 Obstructive sleep apnea (adult) (pediatric): Secondary | ICD-10-CM | POA: Diagnosis not present

## 2021-07-23 DIAGNOSIS — J189 Pneumonia, unspecified organism: Secondary | ICD-10-CM | POA: Diagnosis not present

## 2021-07-23 DIAGNOSIS — I10 Essential (primary) hypertension: Secondary | ICD-10-CM | POA: Diagnosis not present

## 2021-07-23 DIAGNOSIS — I5033 Acute on chronic diastolic (congestive) heart failure: Secondary | ICD-10-CM | POA: Diagnosis not present

## 2021-07-23 DIAGNOSIS — I25119 Atherosclerotic heart disease of native coronary artery with unspecified angina pectoris: Secondary | ICD-10-CM | POA: Diagnosis not present

## 2021-07-23 DIAGNOSIS — J9601 Acute respiratory failure with hypoxia: Secondary | ICD-10-CM | POA: Diagnosis not present

## 2021-07-23 DIAGNOSIS — E782 Mixed hyperlipidemia: Secondary | ICD-10-CM | POA: Diagnosis not present

## 2021-07-23 LAB — BASIC METABOLIC PANEL
Anion gap: 8 (ref 5–15)
BUN: 26 mg/dL — ABNORMAL HIGH (ref 6–20)
CO2: 34 mmol/L — ABNORMAL HIGH (ref 22–32)
Calcium: 8.6 mg/dL — ABNORMAL LOW (ref 8.9–10.3)
Chloride: 96 mmol/L — ABNORMAL LOW (ref 98–111)
Creatinine, Ser: 1.17 mg/dL — ABNORMAL HIGH (ref 0.44–1.00)
GFR, Estimated: >60 mL/min (ref >60)
Glucose, Bld: 207 mg/dL — ABNORMAL HIGH (ref 70–99)
Potassium: 4 mmol/L (ref 3.5–5.1)
Sodium: 138 mmol/L (ref 135–145)

## 2021-07-23 LAB — GLUCOSE, CAPILLARY
Glucose-Capillary: 220 mg/dL — ABNORMAL HIGH (ref 70–99)
Glucose-Capillary: 253 mg/dL — ABNORMAL HIGH (ref 70–99)
Glucose-Capillary: 90 mg/dL (ref 70–99)
Glucose-Capillary: 185 mg/dL — ABNORMAL HIGH (ref 70–99)

## 2021-07-23 LAB — LIPID PANEL
Cholesterol: 114 mg/dL (ref 0–200)
HDL: 34 mg/dL — ABNORMAL LOW (ref >40)
LDL Cholesterol: 68 mg/dL (ref 0–99)
Total CHOL/HDL Ratio: 3.4 RATIO
Triglycerides: 62 mg/dL (ref <150)
VLDL: 12 mg/dL (ref 0–40)

## 2021-07-23 MED ORDER — INSULIN GLARGINE-YFGN 100 UNIT/ML ~~LOC~~ SOLN
25.0000 [IU] | Freq: Every day | SUBCUTANEOUS | Status: DC
  Administered 2021-07-24 – 2021-07-25 (×3): 25 [IU] via SUBCUTANEOUS
  Filled 2021-07-23 (×5): qty 0.25

## 2021-07-23 NOTE — Progress Notes (Signed)
Occupational Therapy Treatment Patient Details Name: Lindsay [name] MRN: [id number] DOB: 1981/11/20 Today's Date: 07/23/2021    History of present illness Pt is a 40 y.o. female who presented 07/10/21 with fever and progressively worsening dyspnea since 07/09/21 that did not respond to her home albuterol. Pt admitted with community acquired pneumonia along with acute on chronic diastolic CHF. PMH: acute renal failure in 2016, asthma, RUE cellulitis, history of CAD, history of NSTEMI in 2017/status post angioplasty with stent, insulin requiring type II DM, hyperlipidemia, hypertension , obesity   OT comments  Pt. Was seen for skilled OT to maximize I and safety with ADL tasks. Pt. Had increased performance with use of AE for LE ADLs. Pt. To continued to be followed for OT services  Follow Up Recommendations  No OT follow up    Equipment Recommendations  Other (comment);Tub/shower bench    Recommendations for Other Services      Precautions / Restrictions Precautions Precautions: Fall Precaution Comments: monitor SpO2 Restrictions Weight Bearing Restrictions: No       Mobility Bed Mobility Overal bed mobility: Modified Independent                  Transfers Overall transfer level: Needs assistance Equipment used: Rolling walker (2 wheeled)   Sit to Stand: Min guard;From elevated surface Stand pivot transfers: Min guard       General transfer comment: Min G for safety    Balance     Sitting balance-Leahy Scale: Good       Standing balance-Leahy Scale: Fair                             ADL either performed or assessed with clinical judgement   ADL Overall ADL's : Needs assistance/impaired Eating/Feeding: Independent   Grooming: Wash/dry hands;Wash/dry face;Standing;Supervision/safety               Lower Body Dressing: Min guard;Sit to/from stand;With adaptive equipment   Toilet Transfer: Min guard;RW           Functional mobility  during ADLs: Min guard;Rolling walker General ADL Comments: Pt. required decreased cues for LE dressing wtih use of AE.     Vision   Vision Assessment?: No apparent visual deficits   Perception     Praxis      Cognition Arousal/Alertness: Awake/alert Behavior During Therapy: WFL for tasks assessed/performed Overall Cognitive Status: Within Functional Limits for tasks assessed                                          Exercises     Shoulder Instructions       General Comments      Pertinent Vitals/ Pain       Pain Assessment: 0-10 Pain Score: 5  Pain Location: back Pain Descriptors / Indicators: Aching Pain Intervention(s): Premedicated before session  Home Living                                          Prior Functioning/Environment              Frequency  Min 2X/week        Progress Toward Goals  OT Goals(current goals can now be found in the care plan  section)  Progress towards OT goals: Progressing toward goals  Acute Rehab OT Goals Patient Stated Goal: go home OT Goal Formulation: With patient Time For Goal Achievement: 07/30/21 Potential to Achieve Goals: Good ADL Goals Pt Will Perform Lower Body Dressing: with modified independence;sit to/from stand;sitting/lateral leans Pt Will Transfer to Toilet: with modified independence;ambulating Pt Will Perform Toileting - Clothing Manipulation and hygiene: with modified independence;sit to/from stand;sitting/lateral leans;with adaptive equipment Additional ADL Goal #1: Patient will verbalize and demonstrate as needed understanding of energy conservation strategies in order to safely participate in daily care routine.  Plan Discharge plan remains appropriate;Frequency remains appropriate    Co-evaluation                 AM-PAC OT "6 Clicks" Daily Activity     Outcome Measure   Help from another person eating meals?: None Help from another person taking care  of personal grooming?: A Little Help from another person toileting, which includes using toliet, bedpan, or urinal?: A Little Help from another person bathing (including washing, rinsing, drying)?: A Little Help from another person to put on and taking off regular upper body clothing?: A Little Help from another person to put on and taking off regular lower body clothing?: A Little 6 Click Score: 19    End of Session Equipment Utilized During Treatment: Oxygen;Rolling walker  OT Visit Diagnosis: Unsteadiness on feet (R26.81);Other abnormalities of gait and mobility (R26.89);Muscle weakness (generalized) (M62.81)   Activity Tolerance Patient tolerated treatment well   Patient Left in chair;with call bell/phone within reach   Nurse Communication Mobility status        Time: 1062-6948 OT Time Calculation (min): 31 min  Charges: OT General Charges $OT Visit: 1 Visit OT Treatments $Self Care/Home Management : 23-37 mins  Derrek Gu OT/L    Solaris Kram 07/23/2021, 10:20 AM

## 2021-07-23 NOTE — Progress Notes (Signed)
RT came to place patient on BiPAP.  Patient hasn't received night medication at this time.  RT will come back when patient is ready.

## 2021-07-23 NOTE — Progress Notes (Signed)
Physical Therapy Treatment Patient Details Name: Lindsay [name] MRN: [id number] DOB: July 22, 1981 Today's Date: 07/23/2021    History of Present Illness Pt is a 40 y.o. female who presented 07/10/21 with fever and progressively worsening dyspnea since 07/09/21 that did not respond to her home albuterol. Pt admitted with community acquired pneumonia along with acute on chronic diastolic CHF. PMH: acute renal failure in 2016, asthma, RUE cellulitis, history of CAD, history of NSTEMI in 2017/status post angioplasty with stent, insulin requiring type II DM, hyperlipidemia, hypertension , obesity    PT Comments    Pt tolerates treatment well, needing multiple seated breaks due to fatigue. Pt is able to ambulate for household distances at this time, and does not require physical assistance for any mobility performed during this session. Pt will benefit from continued acute PT services to further improve activity tolerance and restore the patient's prior level of function.   Follow Up Recommendations  Home health PT;Supervision for mobility/OOB     Equipment Recommendations  None recommended by PT    Recommendations for Other Services       Precautions / Restrictions Precautions Precautions: Fall Precaution Comments: monitor SpO2 Restrictions Weight Bearing Restrictions: No    Mobility  Bed Mobility Overal bed mobility: Modified Independent             General bed mobility comments: HOB elevated    Transfers Overall transfer level: Needs assistance Equipment used: 4-wheeled walker Transfers: Sit to/from Stand Sit to Stand: Supervision            Ambulation/Gait Ambulation/Gait assistance: Supervision Gait Distance (Feet): 100 Feet (100' x 4, 3 seated rest breaks) Assistive device: 4-wheeled walker Gait Pattern/deviations: Step-through pattern Gait velocity: functional Gait velocity interpretation: >2.62 ft/sec, indicative of community ambulatory General Gait Details:  pt with steady step-through gait, able to navigate through obstacles in hallway well   Stairs             Wheelchair Mobility    Modified Rankin (Stroke Patients Only)       Balance Overall balance assessment: Needs assistance Sitting-balance support: No upper extremity supported;Feet supported Sitting balance-Leahy Scale: Good     Standing balance support: No upper extremity supported Standing balance-Leahy Scale: Fair                              Cognition Arousal/Alertness: Awake/alert Behavior During Therapy: WFL for tasks assessed/performed Overall Cognitive Status: Within Functional Limits for tasks assessed                                        Exercises      General Comments General comments (skin integrity, edema, etc.): pt on 2LNC upon arrival, PT weans to [facility] with mobility with stable sats 92% and above when mobilizing, brief period of desat upon completion of ambulation bout to 90% but recovers quickly to 92%.      Pertinent Vitals/Pain Pain Assessment: No/denies pain    Home Living                      Prior Function            PT Goals (current goals can now be found in the care plan section) Acute Rehab PT Goals Patient Stated Goal: go home Progress towards PT goals: Progressing toward goals  Frequency    Min 3X/week      PT Plan Current plan remains appropriate    Co-evaluation              AM-PAC PT "6 Clicks" Mobility   Outcome Measure  Help needed turning from your back to your side while in a flat bed without using bedrails?: None Help needed moving from lying on your back to sitting on the side of a flat bed without using bedrails?: None Help needed moving to and from a bed to a chair (including a wheelchair)?: A Little Help needed standing up from a chair using your arms (e.g., wheelchair or bedside chair)?: A Little Help needed to walk in hospital room?: A Little Help  needed climbing 3-5 steps with a railing? : A Little 6 Click Score: 20    End of Session Equipment Utilized During Treatment: Oxygen Activity Tolerance: Patient tolerated treatment well Patient left: in chair;with call bell/phone within reach Nurse Communication: Mobility status PT Visit Diagnosis: Unsteadiness on feet (R26.81);Other abnormalities of gait and mobility (R26.89);Muscle weakness (generalized) (M62.81);Difficulty in walking, not elsewhere classified (R26.2)     Time: 1700-1749 PT Time Calculation (min) (ACUTE ONLY): 43 min  Charges:  $Gait Training: 23-37 mins $Therapeutic Activity: 8-22 mins                     Arlyss Gandy, PT, DPT Acute Rehabilitation Pager: [phone]    Arlyss Gandy 07/23/2021, 3:09 PM

## 2021-07-23 NOTE — Progress Notes (Signed)
PROGRESS NOTE    Lindsay [name]  ZTI:[id number] DOB: 03/21/1981 DOA: 07/10/2021 PCP: Grayce Sessions, NP    Chief Complaint  Patient presents with   Shortness of Breath    Brief Narrative:  Lindsay [name] is a 40 y.o. female with medical history significant of acute renal failure in 2016, asthma, RUE cellulitis, history of CAD, history of NSTEMI in 2017/status post angioplasty with stent, insulin requiring type II DM, hyperlipidemia, hypertension who is brought to the emergency department via EMS due to progressively worsening dyspnea since 07/09/2021 evening that did not respond to her home albuterol, along with fever, was diagnosed with CAP along with acute on chronic diastolic CHF and admitted to the hospital. She completed the course of antibiotics for CAP. She is currently being diuresed for diastolic CHF with high doses of lasix. Cardiology on board.  So far she has diuresed about 26 lit since admission. She reports breathing has improved.   Assessment & Plan:   Principal Problem:   CAP (community acquired pneumonia) Active Problems:   Essential hypertension   DM neuropathy, type II diabetes mellitus (HCC)   Coronary artery disease involving native coronary artery of native heart with angina pectoris (HCC)   OSA (obstructive sleep apnea)   Hyperlipidemia   Normocytic anemia   Acute respiratory failure with hypoxia (HCC)   Acute on chronic diastolic heart failure (HCC)   Acute respiratory failure with hypoxia probably secondary to a combination of community-acquired pneumonia and acute on chronic diastolic heart here, in the setting of OHS and obstructive sleep apnea. Last echocardiogram showed preserved left ventricular ejection fraction.  Completed the course of IV antibiotics.  Continue with BiPAP at night, and during the day she is on [place] oxygen. She was on 7 lit of [place] oxygen, weaned her down to 3 lit of [place] oxygen.   CXR shows features consistent with CHF She will  probably need oxygen on discharge.  VBG results PH-7.42, PCO2-43.2, PO2-147, HCO3-28.4. REPEAT ABG ordered to see if she will qualify for TRILOGY.  Ambulating oxygen levels checked and she is requiring 3l it /min.  Repeat ABG shows pco2 of 50 and PH of 7.4.     Acute on chronic diastolic heart failure  IMPROVING.  IV lasix increased from 20 BID to 80 mg BID with good diuresis.  She has diuresed about 26.6  L since admission.  Cardiology on board and appreciate recommendations.  Renal parameters appears to be stable while on Lasix.   Continue with strict intake and output and daily weights.  Creatinine at 1.1 this am. No changes in meds, continue to diurese with IV lasix.     CAD S/P PCI in 2021. Some sub sternal pressure resolved after sitting upright.  Continue with aspirin, bb and statin.    Hypertension BP measurements optimal.  Pt on metoprolol 100 mg BID, imdur 60 mg daily, Hydralazine 100 mg TID, amlodipine 10 mg daily.    Dyslipidemia:  Resume statin. Lipid panel wnl.    Morbid obesity .  Body mass index is 66.94 kg/m. Has underlying OSA and OHS.  Has CPAP at home, while in the hospital she is using BIPAP. Will probably need trilogy on discharge.     Mild AKI  Creatinine on admission was 0.88 , worsened to 1.4 and improved to 1.1 with diuresis. Creatinine stable around 1.1. Continue to monitor.    Hyperkalemia:  Resolved   Urinary retention:  S/p foley. Will probably need voiding trial after we  change from IV lasix to oral lasix.  Continue with flomax.    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  Insulin dependent, CBG (last 3)  Recent Labs    07/22/21 2239 07/23/21 0748 07/23/21 1206  GLUCAP 170* 220* 253*   Hemoglobin A1c is 8.3% At home she is on Jardiance 10 mg QD, Humulin U-500 80 units QAM, 75 units with lunch and 75 units with supper, Victoza 1.8 mg QHS Currently she  on 40 units of novolog TID, added glargine 20 units daily and resume  Jardiance. Increase long acting glargine to 25 units daily.    Anemia of chronic disease:  Hemoglobin last year around 36. Admitted with a hemoglobin of 9.7, currently around 8 and stable.  get stool for occult blood.  Anemia panel revealed low iron levels.  Pt refused iron supplementation while in the hospital.   Left upper extremity swelling: post PICC line placement.  Ordered venous duplex of the left upper extremity to evaluate for DVT, which is  negative.  Upper extremity edema is improving.    Hyponatremia:  Resolved with diuresis.  DVT prophylaxis: (Lovenox) Code Status: FULL CODE.  Family Communication: (none at bedside.  Disposition:   Status is: Inpatient  Remains inpatient appropriate because:Ongoing diagnostic testing needed not appropriate for outpatient work up and IV treatments appropriate due to intensity of illness or inability to take PO  Dispo: The patient is from: Home              Anticipated d/c is to: Home              Patient currently is not medically stable to d/c.   Difficult to place patient No       Consultants:  NONE.  Procedures: none.  Antimicrobials:  Antibiotics Given (last 72 hours)     None         Subjective: Pt in good spirits, refusing iron pills. Wants them to be stopped.  No sob this morning.    Objective: Vitals:   07/23/21 0440 07/23/21 0610 07/23/21 0723 07/23/21 0725  BP:      Pulse: 61     Resp: 18     Temp:      TempSrc:      SpO2:   98% 100%  Weight:  (!) 193.9 kg    Height:        Intake/Output Summary (Last 24 hours) at 07/23/2021 1208 Last data filed at 07/23/2021 9622 Gross per 24 hour  Intake 240 ml  Output 2950 ml  Net -2710 ml    Filed Weights   07/21/21 0310 07/22/21 0559 07/23/21 0610  Weight: (!) 202 kg (!) 197.8 kg (!) 193.9 kg    Examination:  General exam: Morbidly obese lady on 3 lit of [place] oxygen, appears comfortable.  Respiratory system: diminished air entry at bases, due to  body habitus,no wheezing heard. On oxygen.  Cardiovascular system: RRR, JVD cannot be appreciated. Upper extremity edema has improved.  Gastrointestinal system: Abdomen is soft NT ND BS+ Central nervous system: Alert and oriented non focal.  Extremities: Lower extremity edema is improving Skin: no rashes seen.  Psychiatry: Mood is appropriate.     Data Reviewed: I have personally reviewed following labs and imaging studies  CBC: Recent Labs  Lab 07/16/21 1602 07/17/21 0610 07/18/21 0740 07/21/21 0326  WBC  --  7.3 6.9 6.9  NEUTROABS  --  4.8 4.4  --   HGB 8.8* 8.4* 8.6* 8.1*  HCT 26.0* 27.3* 27.6* 27.2*  MCV  --  86.1 85.4 86.9  PLT  --  302 314 373     Basic Metabolic Panel: Recent Labs  Lab 07/19/21 1005 07/20/21 0210 07/21/21 0326 07/22/21 0539 07/23/21 0559  NA 130* 137 136 139 138  K 4.4 4.1 3.9 4.1 4.0  CL 95* 97* 97* 96* 96*  CO2 28 32 31 34* 34*  GLUCOSE 170* 112* 177* 158* 207*  BUN 41* 37* 33* 25* 26*  CREATININE 1.19* 1.13* 1.16* 1.10* 1.17*  CALCIUM 8.5* 8.7* 8.5* 9.0 8.6*     GFR: Estimated Creatinine Clearance: 116.7 mL/min (A) (by C-G formula based on SCr of 1.17 mg/dL (H)).  Liver Function Tests: Recent Labs  Lab 07/17/21 0610  AST 26  ALT 29  ALKPHOS 76  BILITOT 0.3  PROT 6.8  ALBUMIN 2.8*     CBG: Recent Labs  Lab 07/22/21 1143 07/22/21 1629 07/22/21 2239 07/23/21 0748 07/23/21 1206  GLUCAP 233* 204* 170* 220* 253*      No results found for this or any previous visit (from the past 240 hour(s)).         Radiology Studies: No results found.      Scheduled Meds:  amLODipine  10 mg Oral Daily   aspirin  81 mg Oral Daily   atorvastatin  40 mg Oral Daily   budesonide (PULMICORT) nebulizer solution  0.25 mg Nebulization BID   Chlorhexidine Gluconate Cloth  6 each Topical Daily   DULoxetine  30 mg Oral q AM   empagliflozin  10 mg Oral Daily   enoxaparin (LOVENOX) injection  100 mg Subcutaneous Q24H    ferrous sulfate  325 mg Oral BID WC   fluconazole  100 mg Oral Daily   furosemide  80 mg Intravenous BID   gabapentin  600 mg Oral TID   hydrALAZINE  100 mg Oral Q8H   insulin aspart  0-5 Units Subcutaneous QHS   insulin aspart  0-9 Units Subcutaneous TID WC   insulin aspart  40 Units Subcutaneous TID WC   insulin glargine-yfgn  20 Units Subcutaneous QHS   ipratropium-albuterol  3 mL Nebulization BID   isosorbide mononitrate  60 mg Oral Daily   metoprolol tartrate  100 mg Oral BID   nystatin   Topical BID   sodium chloride flush  10-40 mL Intracatheter Q12H   tamsulosin  0.4 mg Oral Daily   Continuous Infusions:  sodium chloride 10 mL/hr at 07/16/21 2153     LOS: 13 days        Kathlen Mody, MD [facility]   To contact the attending provider between 7A-7P or the covering provider during after hours 7P-7A, please log into the web site [email or web address] and access using universal [place] password for that web site. If you do not have the password, please call the hospital operator.  07/23/2021, 12:08 PM

## 2021-07-23 NOTE — Progress Notes (Addendum)
Progress Note  Patient Name: Lindsay [name] Date of Encounter: 07/23/2021  [facility] Cardiologist: Lesleigh Noe, MD   Subjective   Breathing and swelling continue to improve. Again had a brief episode of chest pressure while sitting in bed yesterday which lasted for ~15 minutes before resolving. No complaints of palpitations.   Inpatient Medications    Scheduled Meds:  amLODipine  10 mg Oral Daily   aspirin  81 mg Oral Daily   atorvastatin  40 mg Oral Daily   budesonide (PULMICORT) nebulizer solution  0.25 mg Nebulization BID   Chlorhexidine Gluconate Cloth  6 each Topical Daily   DULoxetine  30 mg Oral q AM   empagliflozin  10 mg Oral Daily   enoxaparin (LOVENOX) injection  100 mg Subcutaneous Q24H   ferrous sulfate  325 mg Oral BID WC   fluconazole  100 mg Oral Daily   furosemide  80 mg Intravenous BID   gabapentin  600 mg Oral TID   hydrALAZINE  100 mg Oral Q8H   insulin aspart  0-5 Units Subcutaneous QHS   insulin aspart  0-9 Units Subcutaneous TID WC   insulin aspart  40 Units Subcutaneous TID WC   insulin glargine-yfgn  20 Units Subcutaneous QHS   ipratropium-albuterol  3 mL Nebulization BID   isosorbide mononitrate  60 mg Oral Daily   metoprolol tartrate  100 mg Oral BID   nystatin   Topical BID   sodium chloride flush  10-40 mL Intracatheter Q12H   tamsulosin  0.4 mg Oral Daily   Continuous Infusions:  sodium chloride 10 mL/hr at 07/16/21 2153   PRN Meds: sodium chloride, albuterol, aspirin-acetaminophen-caffeine, baclofen, hydrALAZINE, morphine injection, nitroGLYCERIN, [DISCONTINUED] ondansetron **OR** ondansetron (ZOFRAN) IV, polyethylene glycol, sodium chloride flush   Vital Signs    Vitals:   07/23/21 0440 07/23/21 0610 07/23/21 0723 07/23/21 0725  BP:      Pulse: 61     Resp: 18     Temp:      TempSrc:      SpO2:   98% 100%  Weight:  (!) 193.9 kg    Height:        Intake/Output Summary (Last 24 hours) at 07/23/2021 0758 Last data  filed at 07/23/2021 0610 Gross per 24 hour  Intake 720 ml  Output 4250 ml  Net -3530 ml   Last 3 Weights 07/23/2021 07/22/2021 07/21/2021  Weight (lbs) 427 lb 6.4 oz 436 lb 1.6 oz 445 lb 5.3 oz  Weight (kg) 193.867 kg 197.814 kg 202 kg      Telemetry    Sinus rhythm - Personally Reviewed  ECG    No new tracings - Personally Reviewed  Physical Exam   GEN: Laying in bed in NAD   Neck: difficult to appreciate JVD due to body habitus Cardiac: RRR, no murmurs, rubs, or gallops.  Respiratory: distant breath sounds due to body habitus but no overt wheezing, rhonchi, or rales GI: Soft, obese, nontender, non-distended  MS: 1-2+ LE edema; No deformity. Neuro:  Nonfocal  Psych: Normal affect   Labs    High Sensitivity Troponin:   Recent Labs  Lab 07/10/21 2300 07/11/21 0614 07/11/21 1656 07/11/21 2111 07/12/21 1723  TROPONINIHS 18* 13 9 9 9       Chemistry Recent Labs  Lab 07/17/21 0610 07/18/21 0740 07/21/21 0326 07/22/21 0539 07/23/21 0559  NA 130*   < > 136 139 138  K 5.0   < > 3.9 4.1 4.0  CL 96*   < > 97* 96* 96*  CO2 28   < > 31 34* 34*  GLUCOSE 150*   < > 177* 158* 207*  BUN 40*   < > 33* 25* 26*  CREATININE 1.32*   < > 1.16* 1.10* 1.17*  CALCIUM 8.5*   < > 8.5* 9.0 8.6*  PROT 6.8  --   --   --   --   ALBUMIN 2.8*  --   --   --   --   AST 26  --   --   --   --   ALT 29  --   --   --   --   ALKPHOS 76  --   --   --   --   BILITOT 0.3  --   --   --   --   GFRNONAA 53*   < > >60 >60 >60  ANIONGAP 6   < > 8 9 8    < > = values in this interval not displayed.     Hematology Recent Labs  Lab 07/17/21 0610 07/17/21 1746 07/18/21 0740 07/21/21 0326  WBC 7.3  --  6.9 6.9  RBC 3.17* 3.33* 3.23* 3.13*  HGB 8.4*  --  8.6* 8.1*  HCT 27.3*  --  27.6* 27.2*  MCV 86.1  --  85.4 86.9  MCH 26.5  --  26.6 25.9*  MCHC 30.8  --  31.2 29.8*  RDW 14.3  --  14.3 14.6  PLT 302  --  314 373    BNP Recent Labs  Lab 07/17/21 0610  BNP 190.7*     DDimer No results  for input(s): DDIMER in the last 168 hours.   Radiology    No results found.  Cardiac Studies   Echo from 07/12/21:    1. Left ventricular ejection fraction, by estimation, is 65 to 70%. The  left ventricle has normal function. Left ventricular endocardial border  not optimally defined to evaluate regional wall motion. There is mild left  ventricular hypertrophy. Left  ventricular diastolic parameters were normal.   2. Right ventricular systolic function is normal. The right ventricular  size is normal. There is normal pulmonary artery systolic pressure. The  estimated right ventricular systolic pressure is 18.7 mmHg.   3. The mitral valve is grossly normal. Trivial mitral valve  regurgitation.   4. The aortic valve is tricuspid. Aortic valve regurgitation is not  visualized.   5. The inferior vena cava is normal in size with greater than 50%  respiratory variability, suggesting right atrial pressure of 3 mmHg.   LHC on 04/25/2020:   LAD stent is widely patent with perhaps eccentric 20% narrowing in some views.  The jailed second diagonal is widely patent.  Proximal first diagonal contains ostial to proximal segmental 60% narrowing Normal left main Moderate irregularities in the circumflex up to 40% in the first marginal. Dominant right coronary, luminal irregularities. Normal LV function with EF 60%.  Normal LVEDP.   RECOMMENDATIONS:   Continue aggressive risk factor modification including greater than 150 minutes of moderate activity 5 out of 7 days of the week, weight loss, LDL less than 70, blood pressure control, and management of potential sleep apnea. Chest pain and dyspnea do not appear to be related to obstructive disease or heart failure respectively.  Other explanations should be sought.   Patient Profile     40 y.o. female with PMH of CAD (NSTEMI in 9/ 2017 s/p  DES to LAD and POBA of D2, EF 35 to 45%), ischemic cardiomyopathy with recovered EF,  asthma, insulin  requiring type II DM, hyperlipidemia, hypertension, OSA, morbid obesity, cardiology is following for decompensated CHF.   Assessment & Plan    1. Acute hypoxic respiratory failure: She presented with 24 hours onset of SOB, cough, fever, chills and 07/10/2021, found to be tachypneic with borderline hypoxic 90%, hypertensive 159/71, febrile 101.1 at admission. CXR showed interval development of diffuse interstitial infiltrate representing atypical infection versus edema, POA. Appears multifactorial from CHF decompensation, community-acquired pneumonia, underlying OSA and obesity hypoventilation syndrome. Weaned to RA this morning. She has completed a course of IV antibiotics. - Continue supportive care per primary team - Continue diuresis as below   2. Acute diastolic heart failure:  BNP elevated to 190 on 07/17/21. Echocardiogram completed 07/12/2021 revealed EF 65 to 70%, difficult assess regional wall motion, normal PASP, trivial MR. She has been diuresing with IV lasix with urine output -3.5 L over the past 24 hours, net -25.7L since admission. Weight peaked at 458lbs since admission, down to 427lbs today. Volume status continues to be difficult to determine due to body habitus. She still has 1-2+ LE edema on exam. She has weaned off O2 via [place] yesterday. Cr remains stable at 1.1 - Will continue IV lasix until Cr bumps - Continue monitor strict intake and output, daily weight, daily electrolytes - GDMT: Continue metoprolol 100 mg twice daily and losartan 100 mg daily and Jardiance 10 mg daily   3. CAD s/p PCI/DES to LAD 2017: had brief episode of chest pressure overnight when reclining in bed which was not c/w prior angina and resolved with sitting upright. - Continue medical therapy with aspirin 81 mg, Lipitor 40 mg, metoprolol 100 mg twice daily, Imdur 60 mg daily, amlodipine 10 mg daily   4. Hypertension: BP stable this admission - Continue amlodipine 10 mg daily, metoprolol 100 mg twice daily,  Imdur 60 mg daily, IV Lasix 80 mg twice daily, hydralazine 100 mg 3 times daily   5. Hyperlipidemia: LDL 68 this admission - Continue Lipitor 40 mg daily       For questions or updates, please contact [facility] Please consult [email or web address] for contact info under        Signed, Beatriz Stallion, PA-C  07/23/2021, 7:58 AM

## 2021-07-24 DIAGNOSIS — J189 Pneumonia, unspecified organism: Secondary | ICD-10-CM | POA: Diagnosis not present

## 2021-07-24 DIAGNOSIS — I25119 Atherosclerotic heart disease of native coronary artery with unspecified angina pectoris: Secondary | ICD-10-CM | POA: Diagnosis not present

## 2021-07-24 DIAGNOSIS — E782 Mixed hyperlipidemia: Secondary | ICD-10-CM | POA: Diagnosis not present

## 2021-07-24 DIAGNOSIS — I5033 Acute on chronic diastolic (congestive) heart failure: Secondary | ICD-10-CM | POA: Diagnosis not present

## 2021-07-24 DIAGNOSIS — J9601 Acute respiratory failure with hypoxia: Secondary | ICD-10-CM | POA: Diagnosis not present

## 2021-07-24 DIAGNOSIS — I10 Essential (primary) hypertension: Secondary | ICD-10-CM | POA: Diagnosis not present

## 2021-07-24 DIAGNOSIS — G4733 Obstructive sleep apnea (adult) (pediatric): Secondary | ICD-10-CM | POA: Diagnosis not present

## 2021-07-24 LAB — BASIC METABOLIC PANEL
Anion gap: 8 (ref 5–15)
BUN: 23 mg/dL — ABNORMAL HIGH (ref 6–20)
CO2: 34 mmol/L — ABNORMAL HIGH (ref 22–32)
Calcium: 8.8 mg/dL — ABNORMAL LOW (ref 8.9–10.3)
Chloride: 95 mmol/L — ABNORMAL LOW (ref 98–111)
Creatinine, Ser: 1.01 mg/dL — ABNORMAL HIGH (ref 0.44–1.00)
GFR, Estimated: >60 mL/min (ref >60)
Glucose, Bld: 206 mg/dL — ABNORMAL HIGH (ref 70–99)
Potassium: 3.7 mmol/L (ref 3.5–5.1)
Sodium: 137 mmol/L (ref 135–145)

## 2021-07-24 LAB — GLUCOSE, CAPILLARY
Glucose-Capillary: 198 mg/dL — ABNORMAL HIGH (ref 70–99)
Glucose-Capillary: 194 mg/dL — ABNORMAL HIGH (ref 70–99)
Glucose-Capillary: 178 mg/dL — ABNORMAL HIGH (ref 70–99)
Glucose-Capillary: 143 mg/dL — ABNORMAL HIGH (ref 70–99)

## 2021-07-24 NOTE — Progress Notes (Signed)
Pt placed on BiPAP 12/6 30% sat 100%. RT will cont to monitor.

## 2021-07-24 NOTE — Care Management (Addendum)
07-24-21 6168 Case Manager received a call from [employer] that the patient has been approved for [employer]. [facility] will stop by to educate the patient regarding Trilogy later today. Staff RN and MD is aware.   07-24-21 3729 [employer] called Case Manager back and the information provided was incorrect. Patient has not been approved via insurance for [employer]. Will continue to follow.

## 2021-07-24 NOTE — Progress Notes (Signed)
PROGRESS NOTE    Lindsay [name]  TDH:[id number] DOB: 05/01/1981 DOA: 07/10/2021 PCP: Grayce Sessions, NP    Chief Complaint  Patient presents with   Shortness of Breath    Brief Narrative:  Lindsay [name] is a 40 y.o. female with medical history significant of acute renal failure in 2016, asthma, RUE cellulitis, history of CAD, history of NSTEMI in 2017/status post angioplasty with stent, insulin requiring type II DM, hyperlipidemia, hypertension who is brought to the emergency department via EMS due to progressively worsening dyspnea since 07/09/2021 evening that did not respond to her home albuterol, along with fever, was diagnosed with CAP along with acute on chronic diastolic CHF and admitted to the hospital. She completed the course of antibiotics for CAP. She is currently being diuresed for diastolic CHF with high doses of lasix. Cardiology on board.  So far she has diuresed about 29 lit since admission. She reports breathing has improved.   Assessment & Plan:   Principal Problem:   CAP (community acquired pneumonia) Active Problems:   Essential hypertension   DM neuropathy, type II diabetes mellitus (HCC)   Coronary artery disease involving native coronary artery of native heart with angina pectoris (HCC)   OSA (obstructive sleep apnea)   Hyperlipidemia   Normocytic anemia   Acute respiratory failure with hypoxia (HCC)   Acute on chronic diastolic heart failure (HCC)   Acute respiratory failure with hypoxia probably secondary to a combination of community-acquired pneumonia and acute on chronic diastolic heart here, in the setting of OHS and obstructive sleep apnea. Last echocardiogram showed preserved left ventricular ejection fraction.  Completed the course of IV antibiotics.  Continue with BiPAP at night, and during the day she is on [place] oxygen. She was on 7 lit of [place] oxygen, weaned her down to 3 lit of [place] oxygen.   Repeat CXR shows features consistent with  CHF She will probably need oxygen on discharge.  VBG results PH-7.42, PCO2-43.2, PO2-147, HCO3-28.4. Repeat ABG ordered to see if she will qualify for TRILOGY.  Ambulating oxygen levels checked and she is requiring 3l it /min.  Repeat ABG shows pco2 of 50 and PH of 7.4.     Acute on chronic diastolic heart failure  IMPROVING.  IV lasix increased from 20 BID to 80 mg BID with good diuresis.  She has diuresed about 29.4 L since admission.  Cardiology on board and appreciate recommendations.  Renal parameters appears to be stable while on Lasix.   Continue with strict intake and output and daily weights.  Creatinine at 1.1 this am. No changes in meds, continue to diurese with IV lasix.     CAD S/P PCI in 2021. No chest pain today.  Continue with aspirin, bb and statin.    Hypertension BP parameters are optimal.  Pt on metoprolol 100 mg BID, imdur 60 mg daily, Hydralazine 100 mg TID, amlodipine 10 mg daily.    Dyslipidemia:  Resume statin. Lipid panel wnl.    Morbid obesity .  Body mass index is 66.94 kg/m. Has underlying OSA and OHS.  Has CPAP at home, while in the hospital she is using BIPAP. Will probably need trilogy on discharge.     Mild AKI  Creatinine on admission was 0.88 , worsened to 1.4 and improved to 1.1 with diuresis. Creatinine stable around 1.1. Continue to monitor.    Hyperkalemia:  Resolved   Urinary retention:  S/p foley. Will do a voiding trial today.  She was started  on flomax.    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    Anemia of chronic disease:  Hemoglobin last year around 16.  Admitted with a hemoglobin of 9.7, currently around 8 and stable.  get stool for occult blood.  Anemia panel revealed low iron levels.  Pt refused iron supplementation while in the hospital.   Left upper extremity swelling: post PICC line placement.  Ordered venous duplex of the left upper extremity to evaluate for DVT, which is  negative.  Upper extremity edema is improving.    Hyponatremia:  Resolved with diuresis.  DVT prophylaxis: (Lovenox) Code Status: FULL CODE.  Family Communication: (none at bedside.  Disposition:   Status is: Inpatient  Remains inpatient appropriate because:Ongoing diagnostic testing needed not appropriate for outpatient work up and IV treatments appropriate due to intensity of illness or inability to take PO  Dispo: The patient is from: Home              Anticipated d/c is to: Home              Patient currently is not medically stable to d/c.   Difficult to place patient No       Consultants:   Cardiology.  Procedures: none.  Antimicrobials:  Antibiotics Given (last 72 hours)     None         Subjective: No new complaints breathing has improved.     Objective: Vitals:   07/23/21 2114 07/23/21 2300 07/24/21 0602 07/24/21 0756  BP: 132/62 131/61 117/61   Pulse: 69     Resp: 16     Temp: 98.5 F (36.9 C)  97.9 F (36.6 C)   TempSrc: Oral  Oral   SpO2: 98%   98%  Weight:      Height:        Intake/Output Summary (Last 24 hours) at 07/24/2021 1208 Last data filed at 07/24/2021 1000 Gross per 24 hour  Intake 240 ml  Output 3025 ml  Net -2785 ml    Filed Weights   07/21/21 0310 07/22/21 0559 07/23/21 0610  Weight: (!) 202 kg (!) 197.8 kg (!) 193.9 kg    Examination:  General exam: Morbidly obese lady, on nasal cannula oxygen, does not appear to be in distress Respiratory system: Air entry fair bilateral no wheezing or rhonchi Cardiovascular system: Regular rate rhythm, JVD cannot be appreciated ,upper extremity and  lower extremity edema is improving Gastrointestinal system: Abdomen is soft nontender nondistended bowel sounds are normal Central nervous system: Alert and oriented, grossly nonfocal Extremities: Lower extremity edema is improving Skin: No rashes seen Psychiatry: Mood is appropriate    Data Reviewed: I have personally reviewed following labs and imaging studies  CBC: Recent Labs  Lab 07/18/21 0740 07/21/21 0326  WBC 6.9 6.9  NEUTROABS 4.4  --   HGB 8.6* 8.1*  HCT 27.6* 27.2*  MCV 85.4 86.9  PLT 314 373     Basic Metabolic Panel: Recent Labs  Lab 07/20/21 0210 07/21/21 0326 07/22/21 0539 07/23/21 0559 07/24/21 0500  NA 137 136 139 138  137  K 4.1 3.9 4.1 4.0 3.7  CL 97* 97* 96* 96* 95*  CO2 32 31 34* 34* 34*  GLUCOSE 112* 177* 158* 207* 206*  BUN 37* 33* 25* 26* 23*  CREATININE 1.13* 1.16* 1.10* 1.17* 1.01*  CALCIUM 8.7* 8.5* 9.0 8.6* 8.8*     GFR: Estimated Creatinine Clearance: 135.2 mL/min (A) (by C-G formula based on SCr of 1.01 mg/dL (H)).  Liver Function Tests: No results for input(s): AST, ALT, ALKPHOS, BILITOT, PROT, ALBUMIN in the last 168 hours.   CBG: Recent Labs  Lab 07/23/21 1206 07/23/21 1536 07/23/21 2113 07/24/21 0740 07/24/21 1145  GLUCAP 253* 90 185* 198* 194*      No results found for this or any previous visit (from the past 240 hour(s)).         Radiology Studies: No results found.      Scheduled Meds:  amLODipine  10 mg Oral Daily   aspirin  81 mg Oral Daily   atorvastatin  40 mg Oral Daily   budesonide (PULMICORT) nebulizer solution  0.25 mg Nebulization BID   Chlorhexidine Gluconate Cloth  6 each Topical Daily   DULoxetine  30 mg Oral q AM   empagliflozin  10 mg Oral Daily   enoxaparin (LOVENOX) injection  100 mg Subcutaneous Q24H   fluconazole  100 mg Oral Daily   furosemide  80 mg Intravenous BID   gabapentin  600 mg Oral TID   hydrALAZINE  100 mg Oral Q8H   insulin aspart  0-5 Units Subcutaneous QHS    insulin aspart  0-9 Units Subcutaneous TID WC   insulin aspart  40 Units Subcutaneous TID WC   insulin glargine-yfgn  25 Units Subcutaneous QHS   ipratropium-albuterol  3 mL Nebulization BID   isosorbide mononitrate  60 mg Oral Daily   metoprolol tartrate  100 mg Oral BID   nystatin   Topical BID   sodium chloride flush  10-40 mL Intracatheter Q12H   tamsulosin  0.4 mg Oral Daily   Continuous Infusions:  sodium chloride 10 mL/hr at 07/16/21 2153     LOS: 14 days        Kathlen Mody, MD [facility]   To contact the attending provider between 7A-7P or the covering provider during after hours 7P-7A, please log into the web site [email or web address] and access using universal [place] password for that web site. If you do not have the password, please call the hospital operator.  07/24/2021, 12:08 PM

## 2021-07-24 NOTE — Progress Notes (Signed)
Foley catheter removed at 1352 per order. Patient instructed to request for assistance when ready to use BSC.

## 2021-07-24 NOTE — Progress Notes (Addendum)
Progress Note  Patient Name: Lindsay [name] Date of Encounter: 07/24/2021  [facility] Cardiologist: Lyn Records III, MD   Subjective   Breathing pretty well, but still hypoxic w/ ambulation. No one came to help w/ CPAP so did not wear it last pm (RN aware), but usually does. Was told she might need to go home w/ O2  Inpatient Medications    Scheduled Meds:  amLODipine  10 mg Oral Daily   aspirin  81 mg Oral Daily   atorvastatin  40 mg Oral Daily   budesonide (PULMICORT) nebulizer solution  0.25 mg Nebulization BID   Chlorhexidine Gluconate Cloth  6 each Topical Daily   DULoxetine  30 mg Oral q AM   empagliflozin  10 mg Oral Daily   enoxaparin (LOVENOX) injection  100 mg Subcutaneous Q24H   fluconazole  100 mg Oral Daily   furosemide  80 mg Intravenous BID   gabapentin  600 mg Oral TID   hydrALAZINE  100 mg Oral Q8H   insulin aspart  0-5 Units Subcutaneous QHS   insulin aspart  0-9 Units Subcutaneous TID WC   insulin aspart  40 Units Subcutaneous TID WC   insulin glargine-yfgn  25 Units Subcutaneous QHS   ipratropium-albuterol  3 mL Nebulization BID   isosorbide mononitrate  60 mg Oral Daily   metoprolol tartrate  100 mg Oral BID   nystatin   Topical BID   sodium chloride flush  10-40 mL Intracatheter Q12H   tamsulosin  0.4 mg Oral Daily   Continuous Infusions:  sodium chloride 10 mL/hr at 07/16/21 2153   PRN Meds: sodium chloride, albuterol, aspirin-acetaminophen-caffeine, baclofen, hydrALAZINE, morphine injection, nitroGLYCERIN, [DISCONTINUED] ondansetron **OR** ondansetron (ZOFRAN) IV, polyethylene glycol, sodium chloride flush   Vital Signs    Vitals:   07/23/21 2114 07/23/21 2300 07/24/21 0602 07/24/21 0756  BP: 132/62 131/61 117/61   Pulse: 69     Resp: 16     Temp: 98.5 F (36.9 C)  97.9 F (36.6 C)   TempSrc: Oral  Oral   SpO2: 98%   98%  Weight:      Height:        Intake/Output Summary (Last 24 hours) at 07/24/2021 0818 Last data filed at  07/24/2021 0600 Gross per 24 hour  Intake 480 ml  Output 2575 ml  Net -2095 ml   Last 3 Weights 07/23/2021 07/22/2021 07/21/2021  Weight (lbs) 427 lb 6.4 oz 436 lb 1.6 oz 445 lb 5.3 oz  Weight (kg) 193.867 kg 197.814 kg 202 kg      Telemetry    SR - Personally Reviewed  ECG    No new tracings - Personally Reviewed  Physical Exam   General: Well developed, well nourished, female in no acute distress Head: Eyes PERRLA, Head normocephalic and atraumatic, no JVD seen due to body habitus Lungs: Clear bilaterally to auscultation. Heart: HRRR S1 S2, without rub or gallop. No murmur. 4/4 extremity pulses are 2+ & equal. No JVD. Abdomen: Bowel sounds are present, abdomen soft and non-tender without masses or  hernias noted. Msk: Normal strength and tone for age. Extremities: No clubbing, cyanosis, 1+ edema.    Skin:  No rashes or lesions noted. Neuro: Alert and oriented X 3. Psych:  Good affect, responds appropriately   Labs    High Sensitivity Troponin:   Recent Labs  Lab 07/10/21 2300 07/11/21 0614 07/11/21 1656 07/11/21 2111 07/12/21 1723  TROPONINIHS 18* 13 9 9  9  Chemistry Recent Labs  Lab 07/22/21 0539 07/23/21 0559 07/24/21 0500  NA 139 138 137  K 4.1 4.0 3.7  CL 96* 96* 95*  CO2 34* 34* 34*  GLUCOSE 158* 207* 206*  BUN 25* 26* 23*  CREATININE 1.10* 1.17* 1.01*  CALCIUM 9.0 8.6* 8.8*  GFRNONAA >60 >60 >60  ANIONGAP 9 8 8      Hematology Recent Labs  Lab 07/17/21 1746 07/18/21 0740 07/21/21 0326  WBC  --  6.9 6.9  RBC 3.33* 3.23* 3.13*  HGB  --  8.6* 8.1*  HCT  --  27.6* 27.2*  MCV  --  85.4 86.9  MCH  --  26.6 25.9*  MCHC  --  31.2 29.8*  RDW  --  14.3 14.6  PLT  --  314 373   Lab Results  Component Value Date   TSH 2.930 05/28/2017   BNP    Component Value Date/Time   BNP 190.7 (H) 07/17/2021 0610      Radiology    No results found.  Cardiac Studies   Echo from 07/12/21:    1. Left ventricular ejection fraction, by  estimation, is 65 to 70%. The left ventricle has normal function. Left ventricular endocardial border not optimally defined to evaluate regional wall motion. There is mild left ventricular hypertrophy. Left ventricular diastolic parameters were normal.   2. Right ventricular systolic function is normal. The right ventricular size is normal. There is normal pulmonary artery systolic pressure. The estimated right ventricular systolic pressure is 18.7 mmHg.   3. The mitral valve is grossly normal. Trivial mitral valve  regurgitation.   4. The aortic valve is tricuspid. Aortic valve regurgitation is not  visualized.   5. The inferior vena cava is normal in size with greater than 50%  respiratory variability, suggesting right atrial pressure of 3 mmHg.   LHC on 04/25/2020:   LAD stent is widely patent with perhaps eccentric 20% narrowing in some views.  The jailed second diagonal is widely patent.  Proximal first diagonal contains ostial to proximal segmental 60% narrowing Normal left main Moderate irregularities in the circumflex up to 40% in the first marginal. Dominant right coronary, luminal irregularities. Normal LV function with EF 60%.  Normal LVEDP.   RECOMMENDATIONS:   Continue aggressive risk factor modification including greater than 150 minutes of moderate activity 5 out of 7 days of the week, weight loss, LDL less than 70, blood pressure control, and management of potential sleep apnea. Chest pain and dyspnea do not appear to be related to obstructive disease or heart failure respectively.  Other explanations should be sought.   Patient Profile     40 y.o. female with PMH of CAD (NSTEMI in 9/ 2017 s/p DES to LAD and POBA of D2, EF 35 to 45%), ischemic cardiomyopathy with recovered EF,  asthma, insulin requiring type II DM, hyperlipidemia, hypertension, OSA, morbid obesity, admitted 07/21 with acute hypoxic resp failure, cardiology is following for decompensated CHF.   Assessment &  Plan    1. Acute hypoxic respiratory failure:  - 2nd combination of CAP, CHF, OHV, OSA - still hypoxic w/ ambulation, but has completed ABX - per IM   2. Acute diastolic heart failure:   - EF nl, other results above - wt as high as 454, on 08/03 was 427, wt today pending - I/O net -29.4 L - still w/ some LE edema - continue IV Lasix for now - wt on 06/03/2021 was 415 lbs, so still above dry  wt - continue Jardiance, hydralazine, Imdur, BB   3. CAD s/p PCI/DES to LAD 2017:  - no chest pain overnight - continue med rx as above - no ischemic testing indicated   4. Hypertension:  - BP range 110/55 - 134/68 last 24 hr - continue current therapy - on amlodipine 10 mg qd in addition to other rx   5. Hyperlipidemia:  - LDL 68, continue Lipitor 40 mg qd  Otherwise, per IM     For questions or updates, please contact [facility] Please consult [email or web address] for contact info under        Signed, Theodore Demark, PA-C  07/24/2021, 8:18 AM

## 2021-07-25 DIAGNOSIS — E782 Mixed hyperlipidemia: Secondary | ICD-10-CM | POA: Diagnosis not present

## 2021-07-25 DIAGNOSIS — D649 Anemia, unspecified: Secondary | ICD-10-CM | POA: Diagnosis not present

## 2021-07-25 DIAGNOSIS — G4733 Obstructive sleep apnea (adult) (pediatric): Secondary | ICD-10-CM | POA: Diagnosis not present

## 2021-07-25 DIAGNOSIS — I10 Essential (primary) hypertension: Secondary | ICD-10-CM | POA: Diagnosis not present

## 2021-07-25 DIAGNOSIS — J189 Pneumonia, unspecified organism: Secondary | ICD-10-CM | POA: Diagnosis not present

## 2021-07-25 DIAGNOSIS — Z794 Long term (current) use of insulin: Secondary | ICD-10-CM | POA: Diagnosis not present

## 2021-07-25 DIAGNOSIS — J9601 Acute respiratory failure with hypoxia: Secondary | ICD-10-CM | POA: Diagnosis not present

## 2021-07-25 DIAGNOSIS — I25119 Atherosclerotic heart disease of native coronary artery with unspecified angina pectoris: Secondary | ICD-10-CM | POA: Diagnosis not present

## 2021-07-25 DIAGNOSIS — E1140 Type 2 diabetes mellitus with diabetic neuropathy, unspecified: Secondary | ICD-10-CM | POA: Diagnosis not present

## 2021-07-25 DIAGNOSIS — I5033 Acute on chronic diastolic (congestive) heart failure: Secondary | ICD-10-CM | POA: Diagnosis not present

## 2021-07-25 LAB — BASIC METABOLIC PANEL WITH GFR
Anion gap: 7 (ref 5–15)
BUN: 22 mg/dL — ABNORMAL HIGH (ref 6–20)
CO2: 36 mmol/L — ABNORMAL HIGH (ref 22–32)
Calcium: 8.9 mg/dL (ref 8.9–10.3)
Chloride: 95 mmol/L — ABNORMAL LOW (ref 98–111)
Creatinine, Ser: 0.99 mg/dL (ref 0.44–1.00)
GFR, Estimated: >60 mL/min
Glucose, Bld: 114 mg/dL — ABNORMAL HIGH (ref 70–99)
Potassium: 3.7 mmol/L (ref 3.5–5.1)
Sodium: 138 mmol/L (ref 135–145)

## 2021-07-25 LAB — GLUCOSE, CAPILLARY
Glucose-Capillary: 129 mg/dL — ABNORMAL HIGH (ref 70–99)
Glucose-Capillary: 137 mg/dL — ABNORMAL HIGH (ref 70–99)
Glucose-Capillary: 128 mg/dL — ABNORMAL HIGH (ref 70–99)
Glucose-Capillary: 247 mg/dL — ABNORMAL HIGH (ref 70–99)

## 2021-07-25 NOTE — Progress Notes (Signed)
Physical Therapy Treatment Patient Details Name: Lindsay [name] MRN: [id number] DOB: 04/30/1981 Today's Date: 07/25/2021    History of Present Illness Pt is a 40 y.o. female who presented 07/10/21 with fever and progressively worsening dyspnea since 07/09/21 that did not respond to her home albuterol. Pt admitted with community acquired pneumonia along with acute on chronic diastolic CHF. PMH: acute renal failure in 2016, asthma, RUE cellulitis, history of CAD, history of NSTEMI in 2017/status post angioplasty with stent, insulin requiring type II DM, hyperlipidemia, hypertension , obesity    PT Comments    Pt tolerates treatment well with increased ambulation tolerance. Pt mobilizing without supplemental oxygen this session, with brief period of desat to 90% after completion of initial bout of ambulation, otherwise sats in mid-90s. Pt will benefit from continued aggressive mobilization to improve activity tolerance and restore independence.   Follow Up Recommendations  Home health PT;Supervision for mobility/OOB     Equipment Recommendations  None recommended by PT    Recommendations for Other Services       Precautions / Restrictions Precautions Precautions: Fall Precaution Comments: monitor SpO2 Restrictions Weight Bearing Restrictions: No    Mobility  Bed Mobility                    Transfers Overall transfer level: Modified independent Equipment used: 4-wheeled walker Transfers: Sit to/from Stand Sit to Stand: Modified independent (Device/Increase time)            Ambulation/Gait Ambulation/Gait assistance: Modified independent (Device/Increase time) Gait Distance (Feet): 240 Feet (additional trial of 120', trials separated by seated rest break) Assistive device: 4-wheeled walker Gait Pattern/deviations: Step-through pattern Gait velocity: functional Gait velocity interpretation: >2.62 ft/sec, indicative of community ambulatory General Gait Details: pt  with steady step-through gait   Stairs             Wheelchair Mobility    Modified Rankin (Stroke Patients Only)       Balance Overall balance assessment: Needs assistance Sitting-balance support: No upper extremity supported;Feet supported Sitting balance-Leahy Scale: Good     Standing balance support: No upper extremity supported Standing balance-Leahy Scale: Fair                              Cognition Arousal/Alertness: Awake/alert Behavior During Therapy: WFL for tasks assessed/performed Overall Cognitive Status: Within Functional Limits for tasks assessed                                        Exercises      General Comments General comments (skin integrity, edema, etc.): VSS on RA this session other than brief period of desat to 90% upon completion of initial trial of ambulation      Pertinent Vitals/Pain Pain Assessment: No/denies pain    Home Living                      Prior Function            PT Goals (current goals can now be found in the care plan section) Acute Rehab PT Goals Patient Stated Goal: go home Progress towards PT goals: Progressing toward goals    Frequency    Min 3X/week      PT Plan Current plan remains appropriate    Co-evaluation  AM-PAC PT "6 Clicks" Mobility   Outcome Measure  Help needed turning from your back to your side while in a flat bed without using bedrails?: None Help needed moving from lying on your back to sitting on the side of a flat bed without using bedrails?: None Help needed moving to and from a bed to a chair (including a wheelchair)?: None Help needed standing up from a chair using your arms (e.g., wheelchair or bedside chair)?: None Help needed to walk in hospital room?: A Little Help needed climbing 3-5 steps with a railing? : A Little 6 Click Score: 22    End of Session   Activity Tolerance: Patient tolerated treatment  well Patient left: in bed;with call bell/phone within reach Nurse Communication: Mobility status PT Visit Diagnosis: Unsteadiness on feet (R26.81);Other abnormalities of gait and mobility (R26.89);Muscle weakness (generalized) (M62.81);Difficulty in walking, not elsewhere classified (R26.2)     Time: 3300-7622 PT Time Calculation (min) (ACUTE ONLY): 12 min  Charges:  $Gait Training: 8-22 mins                     Arlyss Gandy, PT, DPT Acute Rehabilitation Pager: [phone]    Arlyss Gandy 07/25/2021, 12:00 PM

## 2021-07-25 NOTE — Care Management (Signed)
07-25-21 Insurance authorization for the [employer] is still pending. Per [employer], it takes at least 3-15 days for insurance [employer] to authorize and we are on day 4. Case Manager will continue to follow for additional needs.

## 2021-07-25 NOTE — Progress Notes (Addendum)
PROGRESS NOTE    Lindsay [name]  IWP:[id number] DOB: 21-May-1981 DOA: 07/10/2021 PCP: Grayce Sessions, NP    Brief Narrative:  Lindsay [name] is a 40 year old female with past medical history significant for CAD with NSTEMI 2017 s/p DES, ischemic cardiomyopathy with recovered EF, asthma, type 2 diabetes mellitus, HLD, essential hypertension, OSA, morbid obesity who presented to [facility] ED on 7/21 with progressive shortness of breath.  Patient reports no improvement with home albuterol MDI.  Also reports subjective fever.  Denies travel or sick contacts.  No chest pain, no palpitations, no diaphoresis, no abdominal pain, no nausea/vomiting/diarrhea, no dysuria, increased urinary frequency or hematuria.  In the ED, temperature 101.9 F, HR 110, RR 16, BP 175/90, SPO2 100% on simple facemask at 8 L/min.  Urinalysis with glucosuria greater than 500 glucose and proteinuria.  WBC 10.9, hemoglobin 9.7, platelets 206.  Lactic acid 2.9.  Troponin 12>18.  BNP 103.2.  Chest x-ray with diffuse interstitial infiltrate atypical infection versus more likely pulmonary edema.  Patient was started on IV ceftriaxone and azithromycin with 2 L LR bolus.   Assessment & Plan:   Principal Problem:   CAP (community acquired pneumonia) Active Problems:   Essential hypertension   DM neuropathy, type II diabetes mellitus (HCC)   Coronary artery disease involving native coronary artery of native heart with angina pectoris (HCC)   OSA (obstructive sleep apnea)   Hyperlipidemia   Normocytic anemia   Acute respiratory failure with hypoxia (HCC)   Acute on chronic diastolic heart failure (HCC)   Acute respiratory failure with hypoxia, POA Patient presenting with progressive shortness of breath, found to have a fever one 1.9 with chest x-ray findings concerning for pneumonia versus pulmonary edema.  Etiology likely multifactorial given decompensated congestive heart failure, pneumonia, morbid obesity with OSA/OHS.   Patient initially requiring 8 L simple facemask on arrival which has now been titrated off at rest.  Repeat VBG during hospitalization with continued elevated PCO2 of 50.0. --Budesonide neb twice daily --Albuterol neb every 4 hours as needed wheezing/shortness of breath --Continue treatment as below.  Community-acquired pneumonia Patient presenting with progressive shortness of breath, fever with temperature 101.9 F on admission.  Requiring 8 L simple facemask to maintain SPO2.  Mildly elevated WBC.  Chest x-ray with findings of atypical infection.  Completed 7-day course of azithromycin and ceftriaxone.  Now oxygenating well on room air.  Acute on chronic diastolic congestive heart failure Patient presenting with elevated BNP, lower extreme edema, shortness of breath.  History of systolic congestive heart failure that has now recovered. TTE 7/23: LVEF 65-70%, mild LVH, IVC normal --Cardiology following, appreciate assistance --net negative 3.0L past 24h and net negative 31L since admission --Wt 198.4>>206>>197.3kg --Furosemide 80 mg IV every 12 hours --Strict I's and O's Daily weights --Follow BMP daily  CAD s/p PCI --Aspirin 81 mg p.o. daily --Atorvastatin 40 mg p.o. daily  Acute renal failure: Resolved Creatinine admission 0.88, worsened to 1.4 following aggressive IV fluid hydration in the ED.  Now improved with IV diuresis. --Cr 0.88>1.4>>0.99 --BMP daily while on IV diuresis  Urinary retention: Resolved During hospitalization, patient required Foley catheterization which was discontinued on 07/24/2021. --Started on tamsulosin --Continue monitor urinary output closely  Type 2 diabetes mellitus Hemoglobin A1c 8.3, poorly controlled.  Home regimen includes Jardiance 10 mg p.o. daily, Humulin U 580 units every morning, 75 units with lunch and 75 units with dinner, Victoza 1.8 mg nightly. --Semglee 25u [place] daily --Jardiance 10 mg p.o. daily --Novolog  40u TIDAC  Essential  hypertension: --Amlodipine 10 mg p.o. daily --Metoprolol tartrate 100 mg p.o. twice daily --Hydralazine 100 mg p.o. every 8 hours --Isosorbide mononitrate 60 mg p.o. daily  Anemia of chronic disease Patient admitted with hemoglobin 9.7, stable.  Last year hemoglobin 13.  Panel receipt revealed low iron levels.  Patient refused iron supplementation while in the hospital.  Hyponatremia: Resolved with diuresis.  Sodium 138 this morning.  Diabetic peripheral neuropathy:  --Gabapentin 600 mg p.o. 3 times daily --Cymbalta 30 mg p.o. every morning  Obstructive sleep apnea Obesity hypoventilation syndrome Patient with continued elevated CO2 on VBG's during the hospitalization.  Likely secondary to underlying OSA and OHS from severe morbid obesity. --Awaiting insurance authorization for Trilogy vent  Morbid obesity Body mass index is 68.11 kg/m. Discussed with patient needs for aggressive lifestyle changes/weight loss as this complicates all facets of care.  Outpatient follow-up with PCP.  May benefit from bariatric evaluation outpatient.  DVT prophylaxis:    Code Status: Full Code Family Communication: No family present at bedside this morning.  Disposition Plan:  Level of care: Progressive Status is: Inpatient  Remains inpatient appropriate because:Unsafe d/c plan, IV treatments appropriate due to intensity of illness or inability to take PO, and Inpatient level of care appropriate due to severity of illness  Dispo: The patient is from: Home              Anticipated d/c is to: Home              Patient currently is not medically stable to d/c.   Difficult to place patient No     Consultants:  Cardiology  Procedures:  TTE 7/23: LVEF 65-70%, mild LVH, IVC normal Vas U/S lower extremities 7/22: Negative for DVT Left upper extremity vascular ultrasound: Negative for DVT  Antimicrobials:  Azithromycin 7/21 - 7/27 Ceftriaxone 7/21 - 7/27 Fluconazole 7/30 -  8/4    Subjective: Patient seen examined at bedside, resting comfortably on bedside commode.  No specific complaints this morning.  Feels her breathing is much improved.  Continues with good urinary output with IV diuresis.  Cardiology seen this morning, continues on IV Lasix twice daily.  Awaiting insurance authorization for trilogy vent.  No other questions or concerns at this time.  Denies headache, no visual changes, no chest pain, palpitations, no abdominal pain, no nausea/vomiting/diarrhea, no fever/chills/night sweats, no weakness, no fatigue, no paresthesias.  No acute events overnight per nursing staff.  Objective: Vitals:   07/24/21 2056 07/24/21 2344 07/25/21 0603 07/25/21 0818  BP:   118/64   Pulse: 65 61 (!) 58 64  Resp: 18 17 16 20   Temp:   97.8 F (36.6 C)   TempSrc:   Oral   SpO2: 100% 100% 98% 98%  Weight:   (!) 197.3 kg   Height:        Intake/Output Summary (Last 24 hours) at 07/25/2021 1524 Last data filed at 07/25/2021 1100 Gross per 24 hour  Intake 360 ml  Output 1625 ml  Net -1265 ml   Filed Weights   07/23/21 0610 07/24/21 1251 07/25/21 0603  Weight: (!) 193.9 kg (!) 196.8 kg (!) 197.3 kg    Examination:  General exam: Appears calm and comfortable  Respiratory system: Breath sounds slightly decreased bilateral bases, otherwise clear to auscultation. Respiratory effort normal.  On room air at rest Cardiovascular system: S1 & S2 heard, RRR. No JVD, murmurs, rubs, gallops or clicks. No pedal edema. Gastrointestinal system: Abdomen is nondistended,  soft and nontender. No organomegaly or masses felt. Normal bowel sounds heard. Central nervous system: Alert and oriented. No focal neurological deficits. Extremities: Symmetric 5 x 5 power. Skin: No rashes, lesions or ulcers Psychiatry: Judgement and insight appear normal. Mood & affect appropriate.     Data Reviewed: I have personally reviewed following labs and imaging studies  CBC: Recent Labs  Lab  07/21/21 0326  WBC 6.9  HGB 8.1*  HCT 27.2*  MCV 86.9  PLT 373   Basic Metabolic Panel: Recent Labs  Lab 07/21/21 0326 07/22/21 0539 07/23/21 0559 07/24/21 0500 07/25/21 0500  NA 136 139 138 137 138  K 3.9 4.1 4.0 3.7 3.7  CL 97* 96* 96* 95* 95*  CO2 31 34* 34* 34* 36*  GLUCOSE 177* 158* 207* 206* 114*  BUN 33* 25* 26* 23* 22*  CREATININE 1.16* 1.10* 1.17* 1.01* 0.99  CALCIUM 8.5* 9.0 8.6* 8.8* 8.9   GFR: Estimated Creatinine Clearance: 139.6 mL/min (by C-G formula based on SCr of 0.99 mg/dL). Liver Function Tests: No results for input(s): AST, ALT, ALKPHOS, BILITOT, PROT, ALBUMIN in the last 168 hours. No results for input(s): LIPASE, AMYLASE in the last 168 hours. No results for input(s): AMMONIA in the last 168 hours. Coagulation Profile: No results for input(s): INR, PROTIME in the last 168 hours. Cardiac Enzymes: No results for input(s): CKTOTAL, CKMB, CKMBINDEX, TROPONINI in the last 168 hours. BNP (last 3 results) No results for input(s): PROBNP in the last 8760 hours. HbA1C: No results for input(s): HGBA1C in the last 72 hours. CBG: Recent Labs  Lab 07/24/21 1145 07/24/21 1550 07/24/21 2055 07/25/21 0800 07/25/21 1207  GLUCAP 194* 178* 143* 129* 137*   Lipid Profile: Recent Labs    07/23/21 0559  CHOL 114  HDL 34*  LDLCALC 68  TRIG 62  CHOLHDL 3.4   Thyroid Function Tests: No results for input(s): TSH, T4TOTAL, FREET4, T3FREE, THYROIDAB in the last 72 hours. Anemia Panel: No results for input(s): VITAMINB12, FOLATE, FERRITIN, TIBC, IRON, RETICCTPCT in the last 72 hours. Sepsis Labs: No results for input(s): PROCALCITON, LATICACIDVEN in the last 168 hours.  No results found for this or any previous visit (from the past 240 hour(s)).       Radiology Studies: No results found.      Scheduled Meds:  amLODipine  10 mg Oral Daily   aspirin  81 mg Oral Daily   atorvastatin  40 mg Oral Daily   budesonide (PULMICORT) nebulizer solution   0.25 mg Nebulization BID   Chlorhexidine Gluconate Cloth  6 each Topical Daily   DULoxetine  30 mg Oral q AM   empagliflozin  10 mg Oral Daily   enoxaparin (LOVENOX) injection  100 mg Subcutaneous Q24H   furosemide  80 mg Intravenous BID   gabapentin  600 mg Oral TID   hydrALAZINE  100 mg Oral Q8H   insulin aspart  0-5 Units Subcutaneous QHS   insulin aspart  0-9 Units Subcutaneous TID WC   insulin aspart  40 Units Subcutaneous TID WC   insulin glargine-yfgn  25 Units Subcutaneous QHS   ipratropium-albuterol  3 mL Nebulization BID   isosorbide mononitrate  60 mg Oral Daily   metoprolol tartrate  100 mg Oral BID   nystatin   Topical BID   sodium chloride flush  10-40 mL Intracatheter Q12H   tamsulosin  0.4 mg Oral Daily   Continuous Infusions:  sodium chloride 10 mL/hr at 07/16/21 2153     LOS: 15  days    Time spent: 38 minutes spent on chart review, discussion with nursing staff, consultants, updating family and interview/physical exam; more than 50% of that time was spent in counseling and/or coordination of care.    Alvira Philips [place], DO [facility] Available via Epic secure chat 7am-7pm After these hours, please refer to coverage provider listed on [email or web address] 07/25/2021, 3:24 PM

## 2021-07-25 NOTE — Progress Notes (Signed)
Progress Note  Patient Name: Lindsay [name] Date of Encounter: 07/25/2021  [facility] Cardiologist: Lyn Records III, MD   Subjective   Pt was unable to sleep flat last night, on room air at rest, continues to be edematous below knees.  Inpatient Medications    Scheduled Meds:  amLODipine  10 mg Oral Daily   aspirin  81 mg Oral Daily   atorvastatin  40 mg Oral Daily   budesonide (PULMICORT) nebulizer solution  0.25 mg Nebulization BID   Chlorhexidine Gluconate Cloth  6 each Topical Daily   DULoxetine  30 mg Oral q AM   empagliflozin  10 mg Oral Daily   enoxaparin (LOVENOX) injection  100 mg Subcutaneous Q24H   furosemide  80 mg Intravenous BID   gabapentin  600 mg Oral TID   hydrALAZINE  100 mg Oral Q8H   insulin aspart  0-5 Units Subcutaneous QHS   insulin aspart  0-9 Units Subcutaneous TID WC   insulin aspart  40 Units Subcutaneous TID WC   insulin glargine-yfgn  25 Units Subcutaneous QHS   ipratropium-albuterol  3 mL Nebulization BID   isosorbide mononitrate  60 mg Oral Daily   metoprolol tartrate  100 mg Oral BID   nystatin   Topical BID   sodium chloride flush  10-40 mL Intracatheter Q12H   tamsulosin  0.4 mg Oral Daily   Continuous Infusions:  sodium chloride 10 mL/hr at 07/16/21 2153   PRN Meds: sodium chloride, albuterol, aspirin-acetaminophen-caffeine, baclofen, hydrALAZINE, morphine injection, nitroGLYCERIN, [DISCONTINUED] ondansetron **OR** ondansetron (ZOFRAN) IV, polyethylene glycol, sodium chloride flush   Vital Signs    Vitals:   07/24/21 2056 07/24/21 2344 07/25/21 0603 07/25/21 0818  BP:   118/64   Pulse: 65 61 (!) 58 64  Resp: 18 17 16 20   Temp:   97.8 F (36.6 C)   TempSrc:   Oral   SpO2: 100% 100% 98% 98%  Weight:   (!) 197.3 kg   Height:        Intake/Output Summary (Last 24 hours) at 07/25/2021 0846 Last data filed at 07/25/2021 0600 Gross per 24 hour  Intake 360 ml  Output 3325 ml  Net -2965 ml   Last 3 Weights 07/25/2021  07/24/2021 07/23/2021  Weight (lbs) 434 lb 14.4 oz 433 lb 12.8 oz 427 lb 6.4 oz  Weight (kg) 197.269 kg 196.77 kg 193.867 kg      Telemetry    Sinus in the 60s - Personally Reviewed  ECG    No new tracings - Personally Reviewed  Physical Exam   GEN: No acute distress, morbidly obese, exam difficult given body habitus Neck: No JVD Cardiac: RRR, no murmurs, rubs, or gallops.  Respiratory: diminished in bases on anterior lung exam GI: Soft, nontender, non-distended  MS: + B LE edema, seems improved above knee Neuro:  Nonfocal  Psych: Normal affect   Labs    High Sensitivity Troponin:   Recent Labs  Lab 07/10/21 2300 07/11/21 0614 07/11/21 1656 07/11/21 2111 07/12/21 1723  TROPONINIHS 18* 13 9 9 9       Chemistry Recent Labs  Lab 07/23/21 0559 07/24/21 0500 07/25/21 0500  NA 138 137 138  K 4.0 3.7 3.7  CL 96* 95* 95*  CO2 34* 34* 36*  GLUCOSE 207* 206* 114*  BUN 26* 23* 22*  CREATININE 1.17* 1.01* 0.99  CALCIUM 8.6* 8.8* 8.9  GFRNONAA >60 >60 >60  ANIONGAP 8 8 7      Hematology Recent Labs  Lab 07/21/21 0326  WBC 6.9  RBC 3.13*  HGB 8.1*  HCT 27.2*  MCV 86.9  MCH 25.9*  MCHC 29.8*  RDW 14.6  PLT 373    BNPNo results for input(s): BNP, PROBNP in the last 168 hours.   DDimer No results for input(s): DDIMER in the last 168 hours.   Radiology    No results found.  Cardiac Studies   Echo from 07/12/21:    1. Left ventricular ejection fraction, by estimation, is 65 to 70%. The left ventricle has normal function. Left ventricular endocardial border not optimally defined to evaluate regional wall motion. There is mild left ventricular hypertrophy. Left ventricular diastolic parameters were normal.   2. Right ventricular systolic function is normal. The right ventricular size is normal. There is normal pulmonary artery systolic pressure. The estimated right ventricular systolic pressure is 18.7 mmHg.   3. The mitral valve is grossly normal. Trivial  mitral valve  regurgitation.  4. The aortic valve is tricuspid. Aortic valve regurgitation is not  visualized.   5. The inferior vena cava is normal in size with greater than 50%  respiratory variability, suggesting right atrial pressure of 3 mmHg.   LHC on 04/25/2020:   LAD stent is widely patent with perhaps eccentric 20% narrowing in some views.  The jailed second diagonal is widely patent.  Proximal first diagonal contains ostial to proximal segmental 60% narrowing Normal left main Moderate irregularities in the circumflex up to 40% in the first marginal. Dominant right coronary, luminal irregularities. Normal LV function with EF 60%.  Normal LVEDP.   RECOMMENDATIONS:   Continue aggressive risk factor modification including greater than 150 minutes of moderate activity 5 out of 7 days of the week, weight loss, LDL less than 70, blood pressure control, and management of potential sleep apnea. Chest pain and dyspnea do not appear to be related to obstructive disease or heart failure respectively.  Other explanations should be sought.     Patient Profile     40 y.o. female with PMH of CAD (NSTEMI in 9/ 2017 s/p DES to LAD and POBA of D2, EF 35 to 45%), ischemic cardiomyopathy with recovered EF,  asthma, insulin requiring type II DM, hyperlipidemia, hypertension, OSA, morbid obesity, admitted 07/21 with acute hypoxic resp failure, cardiology is following for decompensated CHF.  Assessment & Plan    Acute hypoxic respiratory failure - combination of CAP, CHF, OHS, and OSA - on room air, but had not tried to ambulate without O2 yet   Hx of ischemic cardiomyopathy with prior LVEF 35-40% Acute on chronic diastolic heart failure Hyprtension - echo this admission with LVEF 65-70%, mild LVH, LV DD normal, no significant valvular disease - BNP trended up to 190 it the setting of obesity - dry weight thought to be near 415 lbs - diuresing on 80 mg IV lasix  - overall net negative 30 L  with 3.3 L urine output yesterday - weight is 434.9, down from 458.9 - still above dry weight - renal function stable, CO2 36 - GDMT includes jardiance, hydralazine, imdur, and BB - also on amlodipine for HTN - pressure is well-controlled - remains edematous in lower extremities - continue diuresis, although we may be nearing new dry weight   CAD s/p DES-LAD 2017 Hyperlipidemia with LDL goal < 70 - no angina - continue ASA, BB, statin 07/23/2021: Cholesterol 114; HDL 34; LDL Cholesterol 68; Triglycerides 62; VLDL 12      For questions or updates, please contact  [facility] Please consult [email or web address] for contact info under        Signed, Marcelino Duster, PA  07/25/2021, 8:47 AM

## 2021-07-26 DIAGNOSIS — I10 Essential (primary) hypertension: Secondary | ICD-10-CM | POA: Diagnosis not present

## 2021-07-26 DIAGNOSIS — Z794 Long term (current) use of insulin: Secondary | ICD-10-CM | POA: Diagnosis not present

## 2021-07-26 DIAGNOSIS — I5033 Acute on chronic diastolic (congestive) heart failure: Secondary | ICD-10-CM | POA: Diagnosis not present

## 2021-07-26 DIAGNOSIS — D649 Anemia, unspecified: Secondary | ICD-10-CM | POA: Diagnosis not present

## 2021-07-26 DIAGNOSIS — G4733 Obstructive sleep apnea (adult) (pediatric): Secondary | ICD-10-CM | POA: Diagnosis not present

## 2021-07-26 DIAGNOSIS — E1140 Type 2 diabetes mellitus with diabetic neuropathy, unspecified: Secondary | ICD-10-CM | POA: Diagnosis not present

## 2021-07-26 DIAGNOSIS — J9601 Acute respiratory failure with hypoxia: Secondary | ICD-10-CM | POA: Diagnosis not present

## 2021-07-26 DIAGNOSIS — J189 Pneumonia, unspecified organism: Secondary | ICD-10-CM | POA: Diagnosis not present

## 2021-07-26 DIAGNOSIS — E782 Mixed hyperlipidemia: Secondary | ICD-10-CM | POA: Diagnosis not present

## 2021-07-26 DIAGNOSIS — I25119 Atherosclerotic heart disease of native coronary artery with unspecified angina pectoris: Secondary | ICD-10-CM | POA: Diagnosis not present

## 2021-07-26 LAB — GLUCOSE, CAPILLARY
Glucose-Capillary: 182 mg/dL — ABNORMAL HIGH (ref 70–99)
Glucose-Capillary: 204 mg/dL — ABNORMAL HIGH (ref 70–99)
Glucose-Capillary: 194 mg/dL — ABNORMAL HIGH (ref 70–99)
Glucose-Capillary: 119 mg/dL — ABNORMAL HIGH (ref 70–99)
Glucose-Capillary: 96 mg/dL (ref 70–99)

## 2021-07-26 LAB — BASIC METABOLIC PANEL
Anion gap: 7 (ref 5–15)
BUN: 23 mg/dL — ABNORMAL HIGH (ref 6–20)
CO2: 36 mmol/L — ABNORMAL HIGH (ref 22–32)
Calcium: 8.8 mg/dL — ABNORMAL LOW (ref 8.9–10.3)
Chloride: 95 mmol/L — ABNORMAL LOW (ref 98–111)
Creatinine, Ser: 1.03 mg/dL — ABNORMAL HIGH (ref 0.44–1.00)
GFR, Estimated: >60 mL/min (ref >60)
Glucose, Bld: 194 mg/dL — ABNORMAL HIGH (ref 70–99)
Potassium: 3.8 mmol/L (ref 3.5–5.1)
Sodium: 138 mmol/L (ref 135–145)

## 2021-07-26 MED ORDER — FUROSEMIDE 10 MG/ML IJ SOLN: 80.0000 mg | Freq: Three times a day (TID) | INTRAMUSCULAR | Status: DC

## 2021-07-26 MED ORDER — INSULIN GLARGINE-YFGN 100 UNIT/ML ~~LOC~~ SOLN
30.0000 [IU] | Freq: Every day | SUBCUTANEOUS | Status: DC
  Administered 2021-07-26 – 2021-07-29 (×4): 30 [IU] via SUBCUTANEOUS
  Filled 2021-07-26 (×5): qty 0.3

## 2021-07-26 MED ORDER — FUROSEMIDE 10 MG/ML IJ SOLN
80.0000 mg | Freq: Two times a day (BID) | INTRAMUSCULAR | Status: DC
  Administered 2021-07-26 – 2021-07-27 (×2): 80 mg via INTRAVENOUS
  Filled 2021-07-26 (×3): qty 8

## 2021-07-26 NOTE — Progress Notes (Signed)
Progress Note  Patient Name: Lindsay [name] Date of Encounter: 07/26/2021  [facility] Cardiologist: Lesleigh Noe, MD   Subjective   Brathing is better from yesterday  No CP    Inpatient Medications    Scheduled Meds:  amLODipine  10 mg Oral Daily   aspirin  81 mg Oral Daily   atorvastatin  40 mg Oral Daily   budesonide (PULMICORT) nebulizer solution  0.25 mg Nebulization BID   Chlorhexidine Gluconate Cloth  6 each Topical Daily   DULoxetine  30 mg Oral q AM   empagliflozin  10 mg Oral Daily   enoxaparin (LOVENOX) injection  100 mg Subcutaneous Q24H   furosemide  80 mg Intravenous BID   gabapentin  600 mg Oral TID   hydrALAZINE  100 mg Oral Q8H   insulin aspart  0-5 Units Subcutaneous QHS   insulin aspart  0-9 Units Subcutaneous TID WC   insulin aspart  40 Units Subcutaneous TID WC   insulin glargine-yfgn  25 Units Subcutaneous QHS   ipratropium-albuterol  3 mL Nebulization BID   isosorbide mononitrate  60 mg Oral Daily   metoprolol tartrate  100 mg Oral BID   nystatin   Topical BID   sodium chloride flush  10-40 mL Intracatheter Q12H   tamsulosin  0.4 mg Oral Daily   Continuous Infusions:  sodium chloride 10 mL/hr at 07/16/21 2153   PRN Meds: sodium chloride, albuterol, aspirin-acetaminophen-caffeine, baclofen, hydrALAZINE, morphine injection, nitroGLYCERIN, [DISCONTINUED] ondansetron **OR** ondansetron (ZOFRAN) IV, polyethylene glycol, sodium chloride flush   Vital Signs    Vitals:   07/25/21 2159 07/25/21 2320 07/26/21 0500 07/26/21 0648  BP: (!) 156/74  120/65 120/65  Pulse: 75 78    Resp: 18 19    Temp: 98.9 F (37.2 C)  97.6 F (36.4 C)   TempSrc: Oral  Axillary   SpO2: 96% 100%    Weight:   (!) 197.5 kg   Height:        Intake/Output Summary (Last 24 hours) at 07/26/2021 0739 Last data filed at 07/26/2021 0600 Gross per 24 hour  Intake 360 ml  Output 2250 ml  Net -1890 ml    Net  - 30 L   (? Accuracy) Last 3 Weights 07/26/2021 07/25/2021  07/24/2021  Weight (lbs) 435 lb 6.5 oz 434 lb 14.4 oz 433 lb 12.8 oz  Weight (kg) 197.5 kg 197.269 kg 196.77 kg      Telemetry    SR - Personally Reviewed  ECG    No new tracings - Personally Reviewed  Physical Exam   GEN: No acute distress, morbidly obese, exam difficult given body habitus Neck: No JVD Cardiac: RRR, no murmurs, rubs, or gallops.  Respiratory: diminished in bases on anterior lung exam GI: Soft, nontender, non-distended  MS: + B LE edema Neuro:  Nonfocal  Psych: Normal affect   Labs    High Sensitivity Troponin:   Recent Labs  Lab 07/10/21 2300 07/11/21 0614 07/11/21 1656 07/11/21 2111 07/12/21 1723  TROPONINIHS 18* 13 9 9 9       Chemistry Recent Labs  Lab 07/23/21 0559 07/24/21 0500 07/25/21 0500  NA 138 137 138  K 4.0 3.7 3.7  CL 96* 95* 95*  CO2 34* 34* 36*  GLUCOSE 207* 206* 114*  BUN 26* 23* 22*  CREATININE 1.17* 1.01* 0.99  CALCIUM 8.6* 8.8* 8.9  GFRNONAA >60 >60 >60  ANIONGAP 8 8 7      Hematology Recent Labs  Lab 07/21/21  0326  WBC 6.9  RBC 3.13*  HGB 8.1*  HCT 27.2*  MCV 86.9  MCH 25.9*  MCHC 29.8*  RDW 14.6  PLT 373    BNPNo results for input(s): BNP, PROBNP in the last 168 hours.   DDimer No results for input(s): DDIMER in the last 168 hours.   Radiology    No results found.  Cardiac Studies   Echo from 07/12/21:    1. Left ventricular ejection fraction, by estimation, is 65 to 70%. The left ventricle has normal function. Left ventricular endocardial border not optimally defined to evaluate regional wall motion. There is mild left ventricular hypertrophy. Left ventricular diastolic parameters were normal.   2. Right ventricular systolic function is normal. The right ventricular size is normal. There is normal pulmonary artery systolic pressure. The estimated right ventricular systolic pressure is 18.7 mmHg.   3. The mitral valve is grossly normal. Trivial mitral valve  regurgitation.  4. The aortic valve is  tricuspid. Aortic valve regurgitation is not  visualized.   5. The inferior vena cava is normal in size with greater than 50%  respiratory variability, suggesting right atrial pressure of 3 mmHg.   LHC on 04/25/2020:   LAD stent is widely patent with perhaps eccentric 20% narrowing in some views.  The jailed second diagonal is widely patent.  Proximal first diagonal contains ostial to proximal segmental 60% narrowing Normal left main Moderate irregularities in the circumflex up to 40% in the first marginal. Dominant right coronary, luminal irregularities. Normal LV function with EF 60%.  Normal LVEDP.   RECOMMENDATIONS:   Continue aggressive risk factor modification including greater than 150 minutes of moderate activity 5 out of 7 days of the week, weight loss, LDL less than 70, blood pressure control, and management of potential sleep apnea. Chest pain and dyspnea do not appear to be related to obstructive disease or heart failure respectively.  Other explanations should be sought.     Patient Profile     40 y.o. female with PMH of CAD (NSTEMI in 9/ 2017 s/p DES to LAD and POBA of D2, EF 35 to 45%), ischemic cardiomyopathy with recovered EF,  asthma, insulin requiring type II DM, hyperlipidemia, hypertension, OSA, morbid obesity, admitted 07/21 with acute hypoxic resp failure, cardiology is following for decompensated CHF.  Assessment & Plan      Acute on chronic diastolic heart failure - echo this admission with LVEF 65-70%, mild LVH, LV DD normal, no significant valvular disease - BNP trended up to 190 it the setting of obesity - dry weight thought to be near 415 lbs - diuresing on 80 mg IV lasix   I would continue today    - overall net negative 30.5 L with 1.9  L urine output yesterday - weight is 433 down from 458.9 - still above dry weight - renal function stable, CO2 36 - GDMT includes jardiance, hydralazine, imdur, and BB  HTN    COntinue current meds     CAD s/p  DES-LAD 2017 No symptoms of angina   Keep on medical Rx     Hyperlipidemia with LDL goal < 70  Continue lipitor 40  Diet  Switch to carb modified Low NA    07/23/2021: Cholesterol 114; HDL 34; LDL Cholesterol 68; Triglycerides 62; VLDL 12      For questions or updates, please contact [facility] Please consult [email or web address] for contact info under        Signed, Dietrich Pates, MD  07/26/2021,  7:39 AM

## 2021-07-26 NOTE — Progress Notes (Signed)
PROGRESS NOTE    Lindsay [name]  CLE:[id number] DOB: 1981-02-13 DOA: 07/10/2021 PCP: Grayce Sessions, NP    Brief Narrative:  Lindsay [name] is a 40 year old female with past medical history significant for CAD with NSTEMI 2017 s/p DES, ischemic cardiomyopathy with recovered EF, asthma, type 2 diabetes mellitus, HLD, essential hypertension, OSA, morbid obesity who presented to [facility] ED on 7/21 with progressive shortness of breath.  Patient reports no improvement with home albuterol MDI.  Also reports subjective fever.  Denies travel or sick contacts.  No chest pain, no palpitations, no diaphoresis, no abdominal pain, no nausea/vomiting/diarrhea, no dysuria, increased urinary frequency or hematuria.  In the ED, temperature 101.9 F, HR 110, RR 16, BP 175/90, SPO2 100% on simple facemask at 8 L/min.  Urinalysis with glucosuria greater than 500 glucose and proteinuria.  WBC 10.9, hemoglobin 9.7, platelets 206.  Lactic acid 2.9.  Troponin 12>18.  BNP 103.2.  Chest x-ray with diffuse interstitial infiltrate atypical infection versus more likely pulmonary edema.  Patient was started on IV ceftriaxone and azithromycin with 2 L LR bolus.   Assessment & Plan:   Principal Problem:   CAP (community acquired pneumonia) Active Problems:   Essential hypertension   DM neuropathy, type II diabetes mellitus (HCC)   Coronary artery disease involving native coronary artery of native heart with angina pectoris (HCC)   OSA (obstructive sleep apnea)   Hyperlipidemia   Normocytic anemia   Acute respiratory failure with hypoxia (HCC)   Acute on chronic diastolic heart failure (HCC)   Acute respiratory failure with hypoxia, POA Patient presenting with progressive shortness of breath, found to have a fever one 1.9 with chest x-ray findings concerning for pneumonia versus pulmonary edema.  Etiology likely multifactorial given decompensated congestive heart failure, pneumonia, morbid obesity with OSA/OHS.   Patient initially requiring 8 L simple facemask on arrival which has now been titrated off at rest.  Repeat VBG during hospitalization with continued elevated PCO2 of 50.0. --Budesonide neb twice daily --Albuterol neb every 4 hours as needed wheezing/shortness of breath --Continue treatment as below.  Community-acquired pneumonia Patient presenting with progressive shortness of breath, fever with temperature 101.9 F on admission.  Requiring 8 L simple facemask to maintain SPO2.  Mildly elevated WBC.  Chest x-ray with findings of atypical infection.  Completed 7-day course of azithromycin and ceftriaxone.  Now oxygenating well on room air.  Acute on chronic diastolic congestive heart failure Patient presenting with elevated BNP, lower extreme edema, shortness of breath.  History of systolic congestive heart failure that has now recovered. TTE 7/23: LVEF 65-70%, mild LVH, IVC normal --Cardiology following, appreciate assistance --net negative 1.9L past 24h and net negative 33L since admission --Wt 198.4>>206>>197.3>197.5kg --Furosemide 80 mg IV every 12 hours --Strict I's and O's Daily weights --Follow BMP daily  CAD s/p PCI --Aspirin 81 mg p.o. daily --Atorvastatin 40 mg p.o. daily  Acute renal failure: Resolved Creatinine admission 0.88, worsened to 1.4 following aggressive IV fluid hydration in the ED.  Now improved with IV diuresis. --Cr 0.88>1.4>>0.99>1.03 --BMP daily while on IV diuresis  Urinary retention: Resolved During hospitalization, patient required Foley catheterization which was discontinued on 07/24/2021. --Started on tamsulosin --Continue monitor urinary output closely  Type 2 diabetes mellitus Hemoglobin A1c 8.3, poorly controlled.  Home regimen includes Jardiance 10 mg p.o. daily, Humulin U 580 units every morning, 75 units with lunch and 75 units with dinner, Victoza 1.8 mg nightly. --Semglee 25u  daily --Jardiance 10 mg p.o. daily --Novolog  40u  TIDAC  Essential hypertension: --Amlodipine 10 mg p.o. daily --Metoprolol tartrate 100 mg p.o. twice daily --Hydralazine 100 mg p.o. every 8 hours --Isosorbide mononitrate 60 mg p.o. daily  Anemia of chronic disease Patient admitted with hemoglobin 9.7, stable.  Last year hemoglobin 13.  Panel receipt revealed low iron levels.  Patient refused iron supplementation while in the hospital.  Hyponatremia: Resolved with diuresis.  Sodium 138 this morning.  Diabetic peripheral neuropathy:  --Gabapentin 600 mg p.o. 3 times daily --Cymbalta 30 mg p.o. every morning  Obstructive sleep apnea Obesity hypoventilation syndrome Patient with continued elevated CO2 on VBG's during the hospitalization.  Likely secondary to underlying OSA and OHS from severe morbid obesity. --Awaiting insurance authorization for Trilogy vent  Morbid obesity Body mass index is 68.19 kg/m. Discussed with patient needs for aggressive lifestyle changes/weight loss as this complicates all facets of care.  Outpatient follow-up with PCP.  May benefit from bariatric evaluation outpatient.  DVT prophylaxis:    Code Status: Full Code Family Communication: No family present at bedside this morning.  Disposition Plan:  Level of care: Telemetry Cardiac Status is: Inpatient  Remains inpatient appropriate because:Unsafe d/c plan, IV treatments appropriate due to intensity of illness or inability to take PO, and Inpatient level of care appropriate due to severity of illness  Dispo: The patient is from: Home              Anticipated d/c is to: Home              Patient currently is not medically stable to d/c.   Difficult to place patient No     Consultants:  Cardiology  Procedures:  TTE 7/23: LVEF 65-70%, mild LVH, IVC normal Vas U/S lower extremities 7/22: Negative for DVT Left upper extremity vascular ultrasound: Negative for DVT  Antimicrobials:  Azithromycin 7/21 - 7/27 Ceftriaxone 7/21 - 7/27 Fluconazole  7/30 - 8/4    Subjective: Patient seen examined at bedside, resting comfortably.  Lying in bed.  Reports good sleep overnight.  Continues with good diuresis with IV furosemide.  Seen by cardiology this morning with recommendations to continue IV Lasix today.  Continues to wait for insurance authorization for trilogy ventilator for home use.  No other questions or concerns at this time.  Denies headache, no visual changes, no chest pain, palpitations, no abdominal pain, no nausea/vomiting/diarrhea, no fever/chills/night sweats, no weakness, no fatigue, no paresthesias.  No acute events overnight per nursing staff.  Objective: Vitals:   07/26/21 0821 07/26/21 0824 07/26/21 0825 07/26/21 0826  BP: 128/70  128/70   Pulse:   65   Resp:      Temp:      TempSrc:      SpO2:  95%  95%  Weight:      Height:        Intake/Output Summary (Last 24 hours) at 07/26/2021 1318 Last data filed at 07/26/2021 0600 Gross per 24 hour  Intake 360 ml  Output 1250 ml  Net -890 ml   Filed Weights   07/24/21 1251 07/25/21 0603 07/26/21 0500  Weight: (!) 196.8 kg (!) 197.3 kg (!) 197.5 kg    Examination:  General exam: Appears calm and comfortable  Respiratory system: Breath sounds slightly decreased bilateral bases, otherwise clear to auscultation. Respiratory effort normal.  On room air at rest Cardiovascular system: S1 & S2 heard, RRR. No JVD, murmurs, rubs, gallops or clicks. No pedal edema. Gastrointestinal system: Abdomen is nondistended, soft and nontender. No organomegaly  or masses felt. Normal bowel sounds heard. Central nervous system: Alert and oriented. No focal neurological deficits. Extremities: Symmetric 5 x 5 power. Skin: No rashes, lesions or ulcers Psychiatry: Judgement and insight appear normal. Mood & affect appropriate.     Data Reviewed: I have personally reviewed following labs and imaging studies  CBC: Recent Labs  Lab 07/21/21 0326  WBC 6.9  HGB 8.1*  HCT 27.2*  MCV  86.9  PLT 373   Basic Metabolic Panel: Recent Labs  Lab 07/22/21 0539 07/23/21 0559 07/24/21 0500 07/25/21 0500 07/26/21 0500  NA 139 138 137 138 138  K 4.1 4.0 3.7 3.7 3.8  CL 96* 96* 95* 95* 95*  CO2 34* 34* 34* 36* 36*  GLUCOSE 158* 207* 206* 114* 194*  BUN 25* 26* 23* 22* 23*  CREATININE 1.10* 1.17* 1.01* 0.99 1.03*  CALCIUM 9.0 8.6* 8.8* 8.9 8.8*   GFR: Estimated Creatinine Clearance: 134.3 mL/min (A) (by C-G formula based on SCr of 1.03 mg/dL (H)). Liver Function Tests: No results for input(s): AST, ALT, ALKPHOS, BILITOT, PROT, ALBUMIN in the last 168 hours. No results for input(s): LIPASE, AMYLASE in the last 168 hours. No results for input(s): AMMONIA in the last 168 hours. Coagulation Profile: No results for input(s): INR, PROTIME in the last 168 hours. Cardiac Enzymes: No results for input(s): CKTOTAL, CKMB, CKMBINDEX, TROPONINI in the last 168 hours. BNP (last 3 results) No results for input(s): PROBNP in the last 8760 hours. HbA1C: No results for input(s): HGBA1C in the last 72 hours. CBG: Recent Labs  Lab 07/25/21 1207 07/25/21 1613 07/25/21 2201 07/26/21 0737 07/26/21 1046  GLUCAP 137* 128* 247* 182* 204*   Lipid Profile: No results for input(s): CHOL, HDL, LDLCALC, TRIG, CHOLHDL, LDLDIRECT in the last 72 hours.  Thyroid Function Tests: No results for input(s): TSH, T4TOTAL, FREET4, T3FREE, THYROIDAB in the last 72 hours. Anemia Panel: No results for input(s): VITAMINB12, FOLATE, FERRITIN, TIBC, IRON, RETICCTPCT in the last 72 hours. Sepsis Labs: No results for input(s): PROCALCITON, LATICACIDVEN in the last 168 hours.  No results found for this or any previous visit (from the past 240 hour(s)).       Radiology Studies: No results found.      Scheduled Meds:  amLODipine  10 mg Oral Daily   aspirin  81 mg Oral Daily   atorvastatin  40 mg Oral Daily   budesonide (PULMICORT) nebulizer solution  0.25 mg Nebulization BID   Chlorhexidine  Gluconate Cloth  6 each Topical Daily   DULoxetine  30 mg Oral q AM   empagliflozin  10 mg Oral Daily   enoxaparin (LOVENOX) injection  100 mg Subcutaneous Q24H   furosemide  80 mg Intravenous BID   gabapentin  600 mg Oral TID   hydrALAZINE  100 mg Oral Q8H   insulin aspart  0-5 Units Subcutaneous QHS   insulin aspart  0-9 Units Subcutaneous TID WC   insulin aspart  40 Units Subcutaneous TID WC   insulin glargine-yfgn  25 Units Subcutaneous QHS   ipratropium-albuterol  3 mL Nebulization BID   isosorbide mononitrate  60 mg Oral Daily   metoprolol tartrate  100 mg Oral BID   nystatin   Topical BID   sodium chloride flush  10-40 mL Intracatheter Q12H   tamsulosin  0.4 mg Oral Daily   Continuous Infusions:  sodium chloride 10 mL/hr at 07/16/21 2153     LOS: 16 days    Time spent: 38 minutes spent on chart  review, discussion with nursing staff, consultants, updating family and interview/physical exam; more than 50% of that time was spent in counseling and/or coordination of care.    Alvira Philips [place], DO [facility] Available via Epic secure chat 7am-7pm After these hours, please refer to coverage provider listed on [email or web address] 07/26/2021, 1:18 PM

## 2021-07-27 DIAGNOSIS — J9601 Acute respiratory failure with hypoxia: Secondary | ICD-10-CM | POA: Diagnosis not present

## 2021-07-27 DIAGNOSIS — I25119 Atherosclerotic heart disease of native coronary artery with unspecified angina pectoris: Secondary | ICD-10-CM | POA: Diagnosis not present

## 2021-07-27 DIAGNOSIS — D649 Anemia, unspecified: Secondary | ICD-10-CM | POA: Diagnosis not present

## 2021-07-27 DIAGNOSIS — Z794 Long term (current) use of insulin: Secondary | ICD-10-CM | POA: Diagnosis not present

## 2021-07-27 DIAGNOSIS — G4733 Obstructive sleep apnea (adult) (pediatric): Secondary | ICD-10-CM | POA: Diagnosis not present

## 2021-07-27 DIAGNOSIS — J189 Pneumonia, unspecified organism: Secondary | ICD-10-CM | POA: Diagnosis not present

## 2021-07-27 DIAGNOSIS — I10 Essential (primary) hypertension: Secondary | ICD-10-CM | POA: Diagnosis not present

## 2021-07-27 DIAGNOSIS — I5033 Acute on chronic diastolic (congestive) heart failure: Secondary | ICD-10-CM | POA: Diagnosis not present

## 2021-07-27 DIAGNOSIS — E782 Mixed hyperlipidemia: Secondary | ICD-10-CM | POA: Diagnosis not present

## 2021-07-27 DIAGNOSIS — E1140 Type 2 diabetes mellitus with diabetic neuropathy, unspecified: Secondary | ICD-10-CM | POA: Diagnosis not present

## 2021-07-27 LAB — BASIC METABOLIC PANEL
Anion gap: 6 (ref 5–15)
BUN: 16 mg/dL (ref 6–20)
CO2: 34 mmol/L — ABNORMAL HIGH (ref 22–32)
Calcium: 8.1 mg/dL — ABNORMAL LOW (ref 8.9–10.3)
Chloride: 100 mmol/L (ref 98–111)
Creatinine, Ser: 1.01 mg/dL — ABNORMAL HIGH (ref 0.44–1.00)
GFR, Estimated: >60 mL/min (ref >60)
Glucose, Bld: 117 mg/dL — ABNORMAL HIGH (ref 70–99)
Potassium: 3.6 mmol/L (ref 3.5–5.1)
Sodium: 140 mmol/L (ref 135–145)

## 2021-07-27 LAB — GLUCOSE, CAPILLARY
Glucose-Capillary: 123 mg/dL — ABNORMAL HIGH (ref 70–99)
Glucose-Capillary: 164 mg/dL — ABNORMAL HIGH (ref 70–99)
Glucose-Capillary: 83 mg/dL (ref 70–99)
Glucose-Capillary: 217 mg/dL — ABNORMAL HIGH (ref 70–99)

## 2021-07-27 MED ORDER — FUROSEMIDE 10 MG/ML IJ SOLN
80.0000 mg | Freq: Three times a day (TID) | INTRAMUSCULAR | Status: DC
  Administered 2021-07-27 – 2021-07-31 (×12): 80 mg via INTRAVENOUS
  Filled 2021-07-27 (×12): qty 8

## 2021-07-27 MED ORDER — METOPROLOL TARTRATE 50 MG PO TABS
50.0000 mg | ORAL_TABLET | Freq: Two times a day (BID) | ORAL | Status: DC
  Administered 2021-07-27 – 2021-07-31 (×9): 50 mg via ORAL
  Filled 2021-07-27 (×9): qty 1

## 2021-07-27 NOTE — Progress Notes (Signed)
Progress Note  Patient Name: Lindsay [name] Date of Encounter: 07/27/2021  [facility] Cardiologist: Lesleigh Noe, MD   Subjective   No CP   Breathing is a little better   Inpatient Medications    Scheduled Meds:  amLODipine  10 mg Oral Daily   aspirin  81 mg Oral Daily   atorvastatin  40 mg Oral Daily   budesonide (PULMICORT) nebulizer solution  0.25 mg Nebulization BID   Chlorhexidine Gluconate Cloth  6 each Topical Daily   DULoxetine  30 mg Oral q AM   empagliflozin  10 mg Oral Daily   enoxaparin (LOVENOX) injection  100 mg Subcutaneous Q24H   furosemide  80 mg Intravenous BID   gabapentin  600 mg Oral TID   hydrALAZINE  100 mg Oral Q8H   insulin aspart  0-5 Units Subcutaneous QHS   insulin aspart  0-9 Units Subcutaneous TID WC   insulin aspart  40 Units Subcutaneous TID WC   insulin glargine-yfgn  30 Units Subcutaneous QHS   ipratropium-albuterol  3 mL Nebulization BID   isosorbide mononitrate  60 mg Oral Daily   metoprolol tartrate  100 mg Oral BID   nystatin   Topical BID   sodium chloride flush  10-40 mL Intracatheter Q12H   tamsulosin  0.4 mg Oral Daily   Continuous Infusions:  sodium chloride 10 mL/hr at 07/16/21 2153   PRN Meds: sodium chloride, albuterol, aspirin-acetaminophen-caffeine, baclofen, morphine injection, nitroGLYCERIN, [DISCONTINUED] ondansetron **OR** ondansetron (ZOFRAN) IV, polyethylene glycol, sodium chloride flush   Vital Signs    Vitals:   07/26/21 2338 07/27/21 0031 07/27/21 0300 07/27/21 0750  BP:   (!) 105/51 125/64  Pulse: 81 66 67 71  Resp: 16 17 16 17   Temp:   97.8 F (36.6 C) 98.5 F (36.9 C)  TempSrc:   Oral Oral  SpO2: 96% 97%  94%  Weight:   (!) 198.6 kg   Height:        Intake/Output Summary (Last 24 hours) at 07/27/2021 0805 Last data filed at 07/26/2021 1800 Gross per 24 hour  Intake --  Output 1650 ml  Net -1650 ml    Net  - 30 L   (? Accuracy) Last 3 Weights 07/27/2021 07/26/2021 07/25/2021  Weight  (lbs) 437 lb 13.3 oz 435 lb 6.5 oz 434 lb 14.4 oz  Weight (kg) 198.6 kg 197.5 kg 197.269 kg      Telemetry    SR - Personally Reviewed  ECG    No new - Personally Reviewed  Physical Exam   GEN: Morbidly obese 40 yo Exam difficult given body habitus  No acute distress Neck  Neck is full  Cardiac: RRR, no murmurs, rubs, or gallops.  Respiratory: diminished in bases on anterior lung exam GI: Soft, nontender, non-distended  MS: 1+  B LE edema Neuro:  Nonfocal  Psych: Normal affect   Labs    High Sensitivity Troponin:   Recent Labs  Lab 07/10/21 2300 07/11/21 0614 07/11/21 1656 07/11/21 2111 07/12/21 1723  TROPONINIHS 18* 13 9 9 9       Chemistry Recent Labs  Lab 07/24/21 0500 07/25/21 0500 07/26/21 0500  NA 137 138 138  K 3.7 3.7 3.8  CL 95* 95* 95*  CO2 34* 36* 36*  GLUCOSE 206* 114* 194*  BUN 23* 22* 23*  CREATININE 1.01* 0.99 1.03*  CALCIUM 8.8* 8.9 8.8*  GFRNONAA >60 >60 >60  ANIONGAP 8 7 7      Hematology Recent  Labs  Lab 07/21/21 0326  WBC 6.9  RBC 3.13*  HGB 8.1*  HCT 27.2*  MCV 86.9  MCH 25.9*  MCHC 29.8*  RDW 14.6  PLT 373    BNPNo results for input(s): BNP, PROBNP in the last 168 hours.   DDimer No results for input(s): DDIMER in the last 168 hours.   Radiology    No results found.  Cardiac Studies   Echo from 07/12/21:    1. Left ventricular ejection fraction, by estimation, is 65 to 70%. The left ventricle has normal function. Left ventricular endocardial border not optimally defined to evaluate regional wall motion. There is mild left ventricular hypertrophy. Left ventricular diastolic parameters were normal.   2. Right ventricular systolic function is normal. The right ventricular size is normal. There is normal pulmonary artery systolic pressure. The estimated right ventricular systolic pressure is 18.7 mmHg.   3. The mitral valve is grossly normal. Trivial mitral valve  regurgitation.  4. The aortic valve is tricuspid.  Aortic valve regurgitation is not  visualized.   5. The inferior vena cava is normal in size with greater than 50%  respiratory variability, suggesting right atrial pressure of 3 mmHg.   LHC on 04/25/2020:   LAD stent is widely patent with perhaps eccentric 20% narrowing in some views.  The jailed second diagonal is widely patent.  Proximal first diagonal contains ostial to proximal segmental 60% narrowing Normal left main Moderate irregularities in the circumflex up to 40% in the first marginal. Dominant right coronary, luminal irregularities. Normal LV function with EF 60%.  Normal LVEDP.   RECOMMENDATIONS:   Continue aggressive risk factor modification including greater than 150 minutes of moderate activity 5 out of 7 days of the week, weight loss, LDL less than 70, blood pressure control, and management of potential sleep apnea. Chest pain and dyspnea do not appear to be related to obstructive disease or heart failure respectively.  Other explanations should be sought.     Patient Profile     40 y.o. female with PMH of CAD (NSTEMI in 9/ 2017 s/p DES to LAD and POBA of D2, EF 35 to 45%), ischemic cardiomyopathy with recovered EF,  asthma, insulin requiring type II DM, hyperlipidemia, hypertension, OSA, morbid obesity, admitted 07/21 with acute hypoxic resp failure, cardiology is following for decompensated CHF.  Assessment & Plan      Acute on chronic diastolic heart failure - echo this admission with LVEF 65-70%, mild LVH, LV DD normal, no significant valvular disease Pt has diuresed over 32 L  Weight is still 438 lbs (dry wt thought to be 415)  - BNP trended up to 190 it the setting of obesity - dry weight thought to be near 415 lbs I would continue with this   BUN/CR 16/1    CO2 34  Down from 36    Will try tid today    Exam is VERY difficult given body habitus   Could consider  R Heart cath if wt does not come down - GDMT includes jardiance, hydralazine, imdur, and  BB  HTN    COntinue current meds Just informed that nursing held metoprolol yesterday am as HR below 65 (per parameters on orders)   Was about to hold this am      After review I would recomm switching to lopressor 50 bid   FOllow HR and BP  CAD s/p DES-LAD 2017 No symptoms of angina   Keep on medical Rx  Hyperlipidemia    LDL below 79  Continue lipitor 40  Diet  Switched to carb modified Low NA  Morbid obesity   I have asked dietary to see for education   Carbs, calories, timed restriced eating          For questions or updates, please contact [facility] Please consult [email or web address] for contact info under        Signed, Dietrich Pates, MD  07/27/2021, 8:05 AM

## 2021-07-27 NOTE — Progress Notes (Signed)
PROGRESS NOTE    KETARA MAESTRE  LRJ:[id number] DOB: December 24, 1980 DOA: 07/10/2021 PCP: Grayce Sessions, NP    Brief Narrative:  Lindsay [name] is a 40 year old female with past medical history significant for CAD with NSTEMI 2017 s/p DES, ischemic cardiomyopathy with recovered EF, asthma, type 2 diabetes mellitus, HLD, essential hypertension, OSA, morbid obesity who presented to [facility] ED on 7/21 with progressive shortness of breath.  Patient reports no improvement with home albuterol MDI.  Also reports subjective fever.  Denies travel or sick contacts.  No chest pain, no palpitations, no diaphoresis, no abdominal pain, no nausea/vomiting/diarrhea, no dysuria, increased urinary frequency or hematuria.  In the ED, temperature 101.9 F, HR 110, RR 16, BP 175/90, SPO2 100% on simple facemask at 8 L/min.  Urinalysis with glucosuria greater than 500 glucose and proteinuria.  WBC 10.9, hemoglobin 9.7, platelets 206.  Lactic acid 2.9.  Troponin 12>18.  BNP 103.2.  Chest x-ray with diffuse interstitial infiltrate atypical infection versus more likely pulmonary edema.  Patient was started on IV ceftriaxone and azithromycin with 2 L LR bolus.   Assessment & Plan:   Principal Problem:   CAP (community acquired pneumonia) Active Problems:   Essential hypertension   DM neuropathy, type II diabetes mellitus (HCC)   Coronary artery disease involving native coronary artery of native heart with angina pectoris (HCC)   OSA (obstructive sleep apnea)   Hyperlipidemia   Normocytic anemia   Acute respiratory failure with hypoxia (HCC)   Acute on chronic diastolic heart failure (HCC)   Acute respiratory failure with hypoxia, POA Patient presenting with progressive shortness of breath, found to have a fever one 1.9 with chest x-ray findings concerning for pneumonia versus pulmonary edema.  Etiology likely multifactorial given decompensated congestive heart failure, pneumonia, morbid obesity with OSA/OHS.   Patient initially requiring 8 L simple facemask on arrival which has now been titrated off at rest.  Repeat VBG during hospitalization with continued elevated PCO2 of 50.0. --Budesonide neb twice daily --Albuterol neb every 4 hours as needed wheezing/shortness of breath --Continue treatment as below.  Community-acquired pneumonia Patient presenting with progressive shortness of breath, fever with temperature 101.9 F on admission.  Requiring 8 L simple facemask to maintain SPO2.  Mildly elevated WBC.  Chest x-ray with findings of atypical infection.  Completed 7-day course of azithromycin and ceftriaxone.  Now oxygenating well on room air.  Acute on chronic diastolic congestive heart failure Patient presenting with elevated BNP, lower extreme edema, shortness of breath.  History of systolic congestive heart failure that has now recovered. TTE 7/23: LVEF 65-70%, mild LVH, IVC normal --Cardiology following, appreciate assistance --net negative 1.7L past 24h and net negative 34L since admission --Wt 198.4>>206>>197.3>197.5>198.6kg --Furosemide increased to 80 mg IV TID --Strict I's and O's Daily weights --Follow BMP daily  CAD s/p PCI --Aspirin 81 mg p.o. daily --Atorvastatin 40 mg p.o. daily  Acute renal failure: Resolved Creatinine admission 0.88, worsened to 1.4 following aggressive IV fluid hydration in the ED.  Now improved with IV diuresis. --Cr 0.88>1.4>>0.99>1.03>1.01 --BMP daily while on IV diuresis  Urinary retention: Resolved During hospitalization, patient required Foley catheterization which was discontinued on 07/24/2021. --Started on tamsulosin --Continue monitor urinary output closely  Type 2 diabetes mellitus Hemoglobin A1c 8.3, poorly controlled.  Home regimen includes Jardiance 10 mg p.o. daily, Humulin U 580 units every morning, 75 units with lunch and 75 units with dinner, Victoza 1.8 mg nightly. --Semglee 30u  daily --Jardiance 10 mg p.o. daily --Novolog  40u  TIDAC --SSI for further coverage --CBGs qAC/HS  Essential hypertension: --Amlodipine 10 mg p.o. daily --Metoprolol tartrate 100 mg p.o. twice daily --Hydralazine 100 mg p.o. every 8 hours --Isosorbide mononitrate 60 mg p.o. daily  Anemia of chronic disease Patient admitted with hemoglobin 9.7, stable.  Last year hemoglobin 13.  Panel receipt revealed low iron levels.  Patient refused iron supplementation while in the hospital.  Hyponatremia: Resolved with diuresis.  Sodium 138 this morning.  Diabetic peripheral neuropathy:  --Gabapentin 600 mg p.o. 3 times daily --Cymbalta 30 mg p.o. every morning  Obstructive sleep apnea Obesity hypoventilation syndrome Patient with continued elevated CO2 on VBG's during the hospitalization.  Likely secondary to underlying OSA and OHS from severe morbid obesity. --Awaiting insurance authorization for Trilogy vent  Morbid obesity Body mass index is 68.57 kg/m. Discussed with patient needs for aggressive lifestyle changes/weight loss as this complicates all facets of care.  Outpatient follow-up with PCP.  May benefit from bariatric evaluation outpatient.  DVT prophylaxis: Lovenox   Code Status: Full Code Family Communication: No family present at bedside this morning.  Disposition Plan:  Level of care: Telemetry Cardiac Status is: Inpatient  Remains inpatient appropriate because:Unsafe d/c plan, IV treatments appropriate due to intensity of illness or inability to take PO, and Inpatient level of care appropriate due to severity of illness  Dispo: The patient is from: Home              Anticipated d/c is to: Home              Patient currently is not medically stable to d/c.   Difficult to place patient No     Consultants:  Cardiology  Procedures:  TTE 7/23: LVEF 65-70%, mild LVH, IVC normal Vas U/S lower extremities 7/22: Negative for DVT Left upper extremity vascular ultrasound: Negative for DVT  Antimicrobials:  Azithromycin  7/21 - 7/27 Ceftriaxone 7/21 - 7/27 Fluconazole 7/30 - 8/4    Subjective: Patient seen examined at bedside, resting comfortably.  Lying in bed.  Reports good sleep overnight.  Continues with good diuresis with IV furosemide.  Seen by cardiology this morning with recommendations to continue IV Lasix today.  Continues to wait for insurance authorization for trilogy ventilator for home use.  No other questions or concerns at this time.  Denies headache, no visual changes, no chest pain, palpitations, no abdominal pain, no nausea/vomiting/diarrhea, no fever/chills/night sweats, no weakness, no fatigue, no paresthesias.  No acute events overnight per nursing staff.  Objective: Vitals:   07/27/21 0808 07/27/21 0927 07/27/21 1222 07/27/21 1253  BP: 127/63 128/63 124/62   Pulse:  80 81   Resp:   20   Temp:   98.7 F (37.1 C)   TempSrc:   Oral   SpO2:   94% 94%  Weight:      Height:        Intake/Output Summary (Last 24 hours) at 07/27/2021 1325 Last data filed at 07/27/2021 0901 Gross per 24 hour  Intake --  Output 2550 ml  Net -2550 ml   Filed Weights   07/25/21 0603 07/26/21 0500 07/27/21 0300  Weight: (!) 197.3 kg (!) 197.5 kg (!) 198.6 kg    Examination:  General exam: Appears calm and comfortable  Respiratory system: Breath sounds slightly decreased bilateral bases, otherwise clear to auscultation. Respiratory effort normal.  On room air at rest Cardiovascular system: S1 & S2 heard, RRR. No JVD, murmurs, rubs, gallops or clicks. No pedal edema. Gastrointestinal system:  Abdomen is nondistended, soft and nontender. No organomegaly or masses felt. Normal bowel sounds heard. Central nervous system: Alert and oriented. No focal neurological deficits. Extremities: Symmetric 5 x 5 power. Skin: No rashes, lesions or ulcers Psychiatry: Judgement and insight appear normal. Mood & affect appropriate.     Data Reviewed: I have personally reviewed following labs and imaging  studies  CBC: Recent Labs  Lab 07/21/21 0326  WBC 6.9  HGB 8.1*  HCT 27.2*  MCV 86.9  PLT 373   Basic Metabolic Panel: Recent Labs  Lab 07/23/21 0559 07/24/21 0500 07/25/21 0500 07/26/21 0500 07/27/21 0650  NA 138 137 138 138 140  K 4.0 3.7 3.7 3.8 3.6  CL 96* 95* 95* 95* 100  CO2 34* 34* 36* 36* 34*  GLUCOSE 207* 206* 114* 194* 117*  BUN 26* 23* 22* 23* 16  CREATININE 1.17* 1.01* 0.99 1.03* 1.01*  CALCIUM 8.6* 8.8* 8.9 8.8* 8.1*   GFR: Estimated Creatinine Clearance: 137.4 mL/min (A) (by C-G formula based on SCr of 1.01 mg/dL (H)). Liver Function Tests: No results for input(s): AST, ALT, ALKPHOS, BILITOT, PROT, ALBUMIN in the last 168 hours. No results for input(s): LIPASE, AMYLASE in the last 168 hours. No results for input(s): AMMONIA in the last 168 hours. Coagulation Profile: No results for input(s): INR, PROTIME in the last 168 hours. Cardiac Enzymes: No results for input(s): CKTOTAL, CKMB, CKMBINDEX, TROPONINI in the last 168 hours. BNP (last 3 results) No results for input(s): PROBNP in the last 8760 hours. HbA1C: No results for input(s): HGBA1C in the last 72 hours. CBG: Recent Labs  Lab 07/26/21 1347 07/26/21 1738 07/26/21 2105 07/27/21 0740 07/27/21 1133  GLUCAP 194* 119* 96 123* 164*   Lipid Profile: No results for input(s): CHOL, HDL, LDLCALC, TRIG, CHOLHDL, LDLDIRECT in the last 72 hours.  Thyroid Function Tests: No results for input(s): TSH, T4TOTAL, FREET4, T3FREE, THYROIDAB in the last 72 hours. Anemia Panel: No results for input(s): VITAMINB12, FOLATE, FERRITIN, TIBC, IRON, RETICCTPCT in the last 72 hours. Sepsis Labs: No results for input(s): PROCALCITON, LATICACIDVEN in the last 168 hours.  No results found for this or any previous visit (from the past 240 hour(s)).       Radiology Studies: No results found.      Scheduled Meds:  amLODipine  10 mg Oral Daily   aspirin  81 mg Oral Daily   atorvastatin  40 mg Oral Daily    budesonide (PULMICORT) nebulizer solution  0.25 mg Nebulization BID   Chlorhexidine Gluconate Cloth  6 each Topical Daily   DULoxetine  30 mg Oral q AM   empagliflozin  10 mg Oral Daily   enoxaparin (LOVENOX) injection  100 mg Subcutaneous Q24H   furosemide  80 mg Intravenous TID   gabapentin  600 mg Oral TID   hydrALAZINE  100 mg Oral Q8H   insulin aspart  0-5 Units Subcutaneous QHS   insulin aspart  0-9 Units Subcutaneous TID WC   insulin aspart  40 Units Subcutaneous TID WC   insulin glargine-yfgn  30 Units Subcutaneous QHS   ipratropium-albuterol  3 mL Nebulization BID   isosorbide mononitrate  60 mg Oral Daily   metoprolol tartrate  50 mg Oral BID   nystatin   Topical BID   sodium chloride flush  10-40 mL Intracatheter Q12H   tamsulosin  0.4 mg Oral Daily   Continuous Infusions:  sodium chloride 10 mL/hr at 07/16/21 2153     LOS: 17 days  Time spent: 38 minutes spent on chart review, discussion with nursing staff, consultants, updating family and interview/physical exam; more than 50% of that time was spent in counseling and/or coordination of care.    Alvira Philips [place], DO [facility] Available via Epic secure chat 7am-7pm After these hours, please refer to coverage provider listed on [email or web address] 07/27/2021, 1:25 PM

## 2021-07-28 DIAGNOSIS — J9601 Acute respiratory failure with hypoxia: Secondary | ICD-10-CM | POA: Diagnosis not present

## 2021-07-28 DIAGNOSIS — E1140 Type 2 diabetes mellitus with diabetic neuropathy, unspecified: Secondary | ICD-10-CM | POA: Diagnosis not present

## 2021-07-28 DIAGNOSIS — I25119 Atherosclerotic heart disease of native coronary artery with unspecified angina pectoris: Secondary | ICD-10-CM | POA: Diagnosis not present

## 2021-07-28 DIAGNOSIS — G4733 Obstructive sleep apnea (adult) (pediatric): Secondary | ICD-10-CM | POA: Diagnosis not present

## 2021-07-28 DIAGNOSIS — Z794 Long term (current) use of insulin: Secondary | ICD-10-CM | POA: Diagnosis not present

## 2021-07-28 DIAGNOSIS — E782 Mixed hyperlipidemia: Secondary | ICD-10-CM | POA: Diagnosis not present

## 2021-07-28 DIAGNOSIS — D649 Anemia, unspecified: Secondary | ICD-10-CM | POA: Diagnosis not present

## 2021-07-28 DIAGNOSIS — I5033 Acute on chronic diastolic (congestive) heart failure: Secondary | ICD-10-CM | POA: Diagnosis not present

## 2021-07-28 DIAGNOSIS — I10 Essential (primary) hypertension: Secondary | ICD-10-CM | POA: Diagnosis not present

## 2021-07-28 DIAGNOSIS — J189 Pneumonia, unspecified organism: Secondary | ICD-10-CM | POA: Diagnosis not present

## 2021-07-28 LAB — BASIC METABOLIC PANEL
Anion gap: 7 (ref 5–15)
BUN: 17 mg/dL (ref 6–20)
CO2: 35 mmol/L — ABNORMAL HIGH (ref 22–32)
Calcium: 8.7 mg/dL — ABNORMAL LOW (ref 8.9–10.3)
Chloride: 97 mmol/L — ABNORMAL LOW (ref 98–111)
Creatinine, Ser: 1.11 mg/dL — ABNORMAL HIGH (ref 0.44–1.00)
GFR, Estimated: >60 mL/min (ref >60)
Glucose, Bld: 167 mg/dL — ABNORMAL HIGH (ref 70–99)
Potassium: 3.8 mmol/L (ref 3.5–5.1)
Sodium: 139 mmol/L (ref 135–145)

## 2021-07-28 LAB — GLUCOSE, CAPILLARY
Glucose-Capillary: 166 mg/dL — ABNORMAL HIGH (ref 70–99)
Glucose-Capillary: 245 mg/dL — ABNORMAL HIGH (ref 70–99)
Glucose-Capillary: 313 mg/dL — ABNORMAL HIGH (ref 70–99)
Glucose-Capillary: 316 mg/dL — ABNORMAL HIGH (ref 70–99)

## 2021-07-28 NOTE — Progress Notes (Signed)
Occupational Therapy Treatment Patient Details Name: Lindsay [name] MRN: [id number] DOB: 1981-07-20 Today's Date: 07/28/2021    History of present illness Pt is a 40 y.o. female who presented 07/10/21 with fever and progressively worsening dyspnea since 07/09/21 that did not respond to her home albuterol. Pt admitted with community acquired pneumonia along with acute on chronic diastolic CHF. PMH: acute renal failure in 2016, asthma, RUE cellulitis, history of CAD, history of NSTEMI in 2017/status post angioplasty with stent, insulin requiring type II DM, hyperlipidemia, hypertension , obesity   OT comments  Pt presented sitting at EOB and reported being there for about a hour. Pt was able to complete transfer from elevated and low surfaces with increase time. Pt completed room level mobility with no reports of SOB or changes in o2 status. Pt was educated on how to complete ADLS with energy conservation techniques. Pt currently with functional limitations due to the deficits listed below (see OT Problem List).  Pt will benefit from skilled OT to increase their safety and independence with ADL and functional mobility for ADL to facilitate discharge to venue listed below.    Follow Up Recommendations  No OT follow up    Equipment Recommendations  Other (comment);Tub/shower bench    Recommendations for Other Services      Precautions / Restrictions Precautions Precautions: Fall Precaution Comments: monitor SpO2 Restrictions Weight Bearing Restrictions: No       Mobility Bed Mobility Overal bed mobility:  (Pt presented with sitting at EOB)                  Transfers Overall transfer level: Modified independent Equipment used: Rolling walker (2 wheeled) Transfers: Sit to/from Stand Sit to Stand: Modified independent (Device/Increase time)              Balance Overall balance assessment: Needs assistance Sitting-balance support: No upper extremity supported;Feet  supported Sitting balance-Leahy Scale: Good Sitting balance - Comments: Static sitting EOB with supervision.                                   ADL either performed or assessed with clinical judgement   ADL Overall ADL's : Needs assistance/impaired Eating/Feeding: Independent   Grooming: Wash/dry hands;Wash/dry face;Modified independent;Cueing for safety;Cueing for sequencing;Standing   Upper Body Bathing: Set up;Sitting   Lower Body Bathing: Sit to/from stand;Sitting/lateral leans;Supervison/ safety   Upper Body Dressing : Set up;Sitting   Lower Body Dressing: Supervision/safety;Cueing for safety;Cueing for sequencing;Sit to/from stand;Sitting/lateral leans   Toilet Transfer: RW;Supervision/safety                   Vision   Vision Assessment?: No apparent visual deficits   Perception     Praxis      Cognition Arousal/Alertness: Awake/alert Behavior During Therapy: WFL for tasks assessed/performed Overall Cognitive Status: Within Functional Limits for tasks assessed                                          Exercises     Shoulder Instructions       General Comments      Pertinent Vitals/ Pain       Pain Assessment: Faces Faces Pain Scale: Hurts a little bit Pain Location: back Pain Descriptors / Indicators: Aching  Home Living  Prior Functioning/Environment              Frequency  Min 2X/week        Progress Toward Goals  OT Goals(current goals can now be found in the care plan section)  Progress towards OT goals: Progressing toward goals  Acute Rehab OT Goals Patient Stated Goal: go home OT Goal Formulation: With patient Time For Goal Achievement: 07/30/21 Potential to Achieve Goals: Good ADL Goals Pt Will Perform Lower Body Dressing: with modified independence;sit to/from stand;sitting/lateral leans Pt Will Transfer to Toilet: with modified  independence;ambulating Pt Will Perform Toileting - Clothing Manipulation and hygiene: with modified independence;sit to/from stand;sitting/lateral leans;with adaptive equipment Additional ADL Goal #1: Patient will verbalize and demonstrate as needed understanding of energy conservation strategies in order to safely participate in daily care routine.  Plan Discharge plan remains appropriate;Frequency remains appropriate    Co-evaluation                 AM-PAC OT "6 Clicks" Daily Activity     Outcome Measure   Help from another person eating meals?: None Help from another person taking care of personal grooming?: None Help from another person toileting, which includes using toliet, bedpan, or urinal?: None Help from another person bathing (including washing, rinsing, drying)?: A Little Help from another person to put on and taking off regular upper body clothing?: None Help from another person to put on and taking off regular lower body clothing?: A Little 6 Click Score: 22    End of Session Equipment Utilized During Treatment: Rolling walker  OT Visit Diagnosis: Unsteadiness on feet (R26.81);Other abnormalities of gait and mobility (R26.89);Muscle weakness (generalized) (M62.81)   Activity Tolerance Patient tolerated treatment well   Patient Left in bed;with call bell/phone within reach   Nurse Communication          Time: 7510-2585 OT Time Calculation (min): 32 min  Charges: OT General Charges $OT Visit: 1 Visit OT Treatments $Self Care/Home Management : 23-37 mins  Alphia Moh OTR/L  Acute Rehab Services  [phone] office number [phone] pager number    Alphia Moh 07/28/2021, 11:35 AM

## 2021-07-28 NOTE — Progress Notes (Signed)
PROGRESS NOTE    Lindsay [name]  CBS:[id number] DOB: Jul 03, 1981 DOA: 07/10/2021 PCP: Grayce Sessions, NP    Brief Narrative:  Lindsay [name] is a 40 year old female with past medical history significant for CAD with NSTEMI 2017 s/p DES, ischemic cardiomyopathy with recovered EF, asthma, type 2 diabetes mellitus, HLD, essential hypertension, OSA, morbid obesity who presented to [facility] ED on 7/21 with progressive shortness of breath.  Patient reports no improvement with home albuterol MDI.  Also reports subjective fever.  Denies travel or sick contacts.  No chest pain, no palpitations, no diaphoresis, no abdominal pain, no nausea/vomiting/diarrhea, no dysuria, increased urinary frequency or hematuria.  In the ED, temperature 101.9 F, HR 110, RR 16, BP 175/90, SPO2 100% on simple facemask at 8 L/min.  Urinalysis with glucosuria greater than 500 glucose and proteinuria.  WBC 10.9, hemoglobin 9.7, platelets 206.  Lactic acid 2.9.  Troponin 12>18.  BNP 103.2.  Chest x-ray with diffuse interstitial infiltrate atypical infection versus more likely pulmonary edema.  Patient was started on IV ceftriaxone and azithromycin with 2 L LR bolus.   Assessment & Plan:   Principal Problem:   CAP (community acquired pneumonia) Active Problems:   Essential hypertension   DM neuropathy, type II diabetes mellitus (HCC)   Coronary artery disease involving native coronary artery of native heart with angina pectoris (HCC)   OSA (obstructive sleep apnea)   Hyperlipidemia   Normocytic anemia   Acute respiratory failure with hypoxia (HCC)   Acute on chronic diastolic heart failure (HCC)   Acute respiratory failure with hypoxia, POA Patient presenting with progressive shortness of breath, found to have a fever one 1.9 with chest x-ray findings concerning for pneumonia versus pulmonary edema.  Etiology likely multifactorial given decompensated congestive heart failure, pneumonia, morbid obesity with OSA/OHS.   Patient initially requiring 8 L simple facemask on arrival which has now been titrated off at rest.  Repeat VBG during hospitalization with continued elevated PCO2 of 50.0. --Budesonide neb twice daily --Albuterol neb every 4 hours as needed wheezing/shortness of breath --Continue treatment as below.  Community-acquired pneumonia Patient presenting with progressive shortness of breath, fever with temperature 101.9 F on admission.  Requiring 8 L simple facemask to maintain SPO2.  Mildly elevated WBC.  Chest x-ray with findings of atypical infection.  Completed 7-day course of azithromycin and ceftriaxone.  Now oxygenating well on room air.  Acute on chronic diastolic congestive heart failure Patient presenting with elevated BNP, lower extreme edema, shortness of breath.  History of systolic congestive heart failure that has now recovered. TTE 7/23: LVEF 65-70%, mild LVH, IVC normal --Cardiology following, appreciate assistance --net negative 2.4L past 24h and net negative 37L since admission --Wt 198.4>>206>>197.3>197.5>198.6>196.8kg --Furosemide increased to 80 mg IV TID on 8/7 --Strict I's and O's Daily weights --Follow BMP daily  CAD s/p PCI --Aspirin 81 mg p.o. daily --Atorvastatin 40 mg p.o. daily  Acute renal failure: Resolved Creatinine admission 0.88, worsened to 1.4 following aggressive IV fluid hydration in the ED.  Now improved with IV diuresis. --Cr 0.88>1.4>>0.99>1.03>1.01>1.11 --BMP daily while on IV diuresis  Urinary retention: Resolved During hospitalization, patient required Foley catheterization which was discontinued on 07/24/2021. --Started on tamsulosin --Continue monitor urinary output closely  Type 2 diabetes mellitus Hemoglobin A1c 8.3, poorly controlled.  Home regimen includes Jardiance 10 mg p.o. daily, Humulin U 580 units every morning, 75 units with lunch and 75 units with dinner, Victoza 1.8 mg nightly. --Semglee 30u [place] daily --Jardiance 10 mg p.o.  daily --SSI for further coverage --CBGs qAC/HS  Essential hypertension: --Amlodipine 10 mg p.o. daily --Metoprolol tartrate 100 mg p.o. twice daily --Hydralazine 100 mg p.o. every 8 hours --Isosorbide mononitrate 60 mg p.o. daily  Anemia of chronic disease Patient admitted with hemoglobin 9.7, stable.  Last year hemoglobin 13.  Panel receipt revealed low iron levels.  Patient refused iron supplementation while in the hospital.  Hyponatremia: Resolved with diuresis.  Sodium 138 this morning.  Diabetic peripheral neuropathy:  --Gabapentin 600 mg p.o. 3 times daily --Cymbalta 30 mg p.o. every morning  Obstructive sleep apnea Obesity hypoventilation syndrome Patient with continued elevated CO2 on VBG's during the hospitalization.  Likely secondary to underlying OSA and OHS from severe morbid obesity. --Awaiting insurance authorization for Trilogy vent  Morbid obesity Body mass index is 67.99 kg/m. Discussed with patient needs for aggressive lifestyle changes/weight loss as this complicates all facets of care.  Outpatient follow-up with PCP.  May benefit from bariatric evaluation outpatient.  DVT prophylaxis: Lovenox   Code Status: Full Code Family Communication: No family present at bedside this morning.  Disposition Plan:  Level of care: Telemetry Cardiac Status is: Inpatient  Remains inpatient appropriate because:Unsafe d/c plan, IV treatments appropriate due to intensity of illness or inability to take PO, and Inpatient level of care appropriate due to severity of illness  Dispo: The patient is from: Home              Anticipated d/c is to: Home              Patient currently is not medically stable to d/c.   Difficult to place patient No     Consultants:  Cardiology  Procedures:  TTE 7/23: LVEF 65-70%, mild LVH, IVC normal Vas U/S lower extremities 7/22: Negative for DVT Left upper extremity vascular ultrasound: Negative for DVT  Antimicrobials:  Azithromycin  7/21 - 7/27 Ceftriaxone 7/21 - 7/27 Fluconazole 7/30 - 8/4    Subjective: Patient seen examined at bedside, resting comfortably.  Sitting at edge of bed eating breakfast.  RN present.  Continues with good urine output.  Seen by cardiology this morning with recommendations to continue IV diuresis 3 times daily today.  Continues to wait for insurance authorization for trilogy ventilator for home use.  No other questions or concerns at this time.  Denies headache, no visual changes, no chest pain, palpitations, no abdominal pain, no nausea/vomiting/diarrhea, no fever/chills/night sweats, no weakness, no fatigue, no paresthesias.  No acute events overnight per nursing staff.  Objective: Vitals:   07/28/21 0545 07/28/21 0620 07/28/21 0758 07/28/21 0806  BP:  114/66  134/73  Pulse: 63 67 72 72  Resp:  18 18   Temp:  98.2 F (36.8 C)    TempSrc:  Oral    SpO2: 96% 92%    Weight:  (!) 196.9 kg    Height:        Intake/Output Summary (Last 24 hours) at 07/28/2021 1233 Last data filed at 07/28/2021 1157 Gross per 24 hour  Intake --  Output 2600 ml  Net -2600 ml   Filed Weights   07/26/21 0500 07/27/21 0300 07/28/21 0620  Weight: (!) 197.5 kg (!) 198.6 kg (!) 196.9 kg    Examination:  General exam: Appears calm and comfortable  Respiratory system: Breath sounds slightly decreased bilateral bases, otherwise clear to auscultation. Respiratory effort normal.  On room air at rest Cardiovascular system: S1 & S2 heard, RRR. No JVD, murmurs, rubs, gallops or clicks. No pedal  edema. Gastrointestinal system: Abdomen is nondistended, soft and nontender. No organomegaly or masses felt. Normal bowel sounds heard. Central nervous system: Alert and oriented. No focal neurological deficits. Extremities: Symmetric 5 x 5 power. Skin: No rashes, lesions or ulcers Psychiatry: Judgement and insight appear normal. Mood & affect appropriate.     Data Reviewed: I have personally reviewed following labs and  imaging studies  CBC: No results for input(s): WBC, NEUTROABS, HGB, HCT, MCV, PLT in the last 168 hours.  Basic Metabolic Panel: Recent Labs  Lab 07/24/21 0500 07/25/21 0500 07/26/21 0500 07/27/21 0650 07/28/21 0500  NA 137 138 138 140 139  K 3.7 3.7 3.8 3.6 3.8  CL 95* 95* 95* 100 97*  CO2 34* 36* 36* 34* 35*  GLUCOSE 206* 114* 194* 117* 167*  BUN 23* 22* 23* 16 17  CREATININE 1.01* 0.99 1.03* 1.01* 1.11*  CALCIUM 8.8* 8.9 8.8* 8.1* 8.7*   GFR: Estimated Creatinine Clearance: 124.3 mL/min (A) (by C-G formula based on SCr of 1.11 mg/dL (H)). Liver Function Tests: No results for input(s): AST, ALT, ALKPHOS, BILITOT, PROT, ALBUMIN in the last 168 hours. No results for input(s): LIPASE, AMYLASE in the last 168 hours. No results for input(s): AMMONIA in the last 168 hours. Coagulation Profile: No results for input(s): INR, PROTIME in the last 168 hours. Cardiac Enzymes: No results for input(s): CKTOTAL, CKMB, CKMBINDEX, TROPONINI in the last 168 hours. BNP (last 3 results) No results for input(s): PROBNP in the last 8760 hours. HbA1C: No results for input(s): HGBA1C in the last 72 hours. CBG: Recent Labs  Lab 07/27/21 1133 07/27/21 1620 07/27/21 2147 07/28/21 0723 07/28/21 1133  GLUCAP 164* 83 217* 166* 245*   Lipid Profile: No results for input(s): CHOL, HDL, LDLCALC, TRIG, CHOLHDL, LDLDIRECT in the last 72 hours.  Thyroid Function Tests: No results for input(s): TSH, T4TOTAL, FREET4, T3FREE, THYROIDAB in the last 72 hours. Anemia Panel: No results for input(s): VITAMINB12, FOLATE, FERRITIN, TIBC, IRON, RETICCTPCT in the last 72 hours. Sepsis Labs: No results for input(s): PROCALCITON, LATICACIDVEN in the last 168 hours.  No results found for this or any previous visit (from the past 240 hour(s)).       Radiology Studies: No results found.      Scheduled Meds:  amLODipine  10 mg Oral Daily   aspirin  81 mg Oral Daily   atorvastatin  40 mg Oral  Daily   budesonide (PULMICORT) nebulizer solution  0.25 mg Nebulization BID   Chlorhexidine Gluconate Cloth  6 each Topical Daily   DULoxetine  30 mg Oral q AM   empagliflozin  10 mg Oral Daily   enoxaparin (LOVENOX) injection  100 mg Subcutaneous Q24H   furosemide  80 mg Intravenous TID   gabapentin  600 mg Oral TID   hydrALAZINE  100 mg Oral Q8H   insulin aspart  0-5 Units Subcutaneous QHS   insulin aspart  0-9 Units Subcutaneous TID WC   insulin glargine-yfgn  30 Units Subcutaneous QHS   ipratropium-albuterol  3 mL Nebulization BID   isosorbide mononitrate  60 mg Oral Daily   metoprolol tartrate  50 mg Oral BID   nystatin   Topical BID   sodium chloride flush  10-40 mL Intracatheter Q12H   tamsulosin  0.4 mg Oral Daily   Continuous Infusions:  sodium chloride 10 mL/hr at 07/16/21 2153     LOS: 18 days    Time spent: 38 minutes spent on chart review, discussion with nursing staff, consultants,  updating family and interview/physical exam; more than 50% of that time was spent in counseling and/or coordination of care.    Lindsay Philips [place], DO [facility] Available via Epic secure chat 7am-7pm After these hours, please refer to coverage provider listed on [email or web address] 07/28/2021, 12:33 PM

## 2021-07-28 NOTE — Progress Notes (Signed)
Physical Therapy Treatment Patient Details Name: Lindsay [name] MRN: [id number] DOB: 04/10/1981 Today's Date: 07/28/2021    History of Present Illness Pt is a 40 y.o. female who presented 07/10/21 with fever and progressively worsening dyspnea since 07/09/21 that did not respond to her home albuterol. Pt admitted with community acquired pneumonia along with acute on chronic diastolic CHF. PMH: acute renal failure in 2016, asthma, RUE cellulitis, history of CAD, history of NSTEMI in 2017/status post angioplasty with stent, insulin requiring type II DM, hyperlipidemia, hypertension , obesity    PT Comments    Today's session continued to focus on mobility progression with increased distance covered on second gait rep vs last session. The pt is making steady progress. Acute PT to continue during pt's hospital stay.    Follow Up Recommendations  Home health PT;Supervision for mobility/OOB     Equipment Recommendations  None recommended by PT    Recommendations for Other Services       Precautions / Restrictions Precautions Precautions: Fall Precaution Comments: monitor SpO2 Restrictions Weight Bearing Restrictions: No    Mobility  Bed Mobility Overal bed mobility: Modified Independent             General bed mobility comments: no assistance needed for supine>EOB, pt left sitting at EOB at end of session with HOB 20 degrees    Transfers Overall transfer level: Modified independent Equipment used: 4-wheeled walker Transfers: Sit to/from Stand Sit to Stand: Modified independent (Device/Increase time)            Ambulation/Gait Ambulation/Gait assistance: Modified independent (Device/Increase time) Gait Distance (Feet): 230 Feet (x2 with seated rest break between) Assistive device: 4-wheeled walker Gait Pattern/deviations: Step-to pattern Gait velocity: WFL   General Gait Details: no balance issues noted. SaO2 88% after 1st gait rep, increased to >/=90% within less  than 30 seconds sitting down, and SaO2 92% after second gait rep, all on RA.          Balance Overall balance assessment: Needs assistance Sitting-balance support: No upper extremity supported;Feet supported Sitting balance-Leahy Scale: Good Sitting balance - Comments: Static sitting EOB with supervision.                      Cognition Arousal/Alertness: Awake/alert Behavior During Therapy: WFL for tasks assessed/performed Overall Cognitive Status: Within Functional Limits for tasks assessed                  Pertinent Vitals/Pain Pain Assessment: No/denies pain Faces Pain Scale: Hurts a little bit Pain Location: back Pain Descriptors / Indicators: Aching     PT Goals (current goals can now be found in the care plan section) Acute Rehab PT Goals Patient Stated Goal: go home PT Goal Formulation: With patient Time For Goal Achievement: 07/30/21 Potential to Achieve Goals: Good Progress towards PT goals: Progressing toward goals    Frequency    Min 3X/week      PT Plan Current plan remains appropriate    AM-PAC PT "6 Clicks" Mobility   Outcome Measure  Help needed turning from your back to your side while in a flat bed without using bedrails?: None Help needed moving from lying on your back to sitting on the side of a flat bed without using bedrails?: None Help needed moving to and from a bed to a chair (including a wheelchair)?: None Help needed standing up from a chair using your arms (e.g., wheelchair or bedside chair)?: None Help needed to walk in hospital room?:  A Little Help needed climbing 3-5 steps with a railing? : A Little 6 Click Score: 22    End of Session   Activity Tolerance: Patient tolerated treatment well Patient left: in bed;with call bell/phone within reach (seated at EOB) Nurse Communication: Mobility status PT Visit Diagnosis: Unsteadiness on feet (R26.81);Other abnormalities of gait and mobility (R26.89);Muscle weakness  (generalized) (M62.81);Difficulty in walking, not elsewhere classified (R26.2)     Time: 0350-0938 PT Time Calculation (min) (ACUTE ONLY): 19 min  Charges:  $Gait Training: 8-22 mins           Sallyanne Kuster, PTA, CLT Acute [employer] [phone] 07/28/21, 2:17 PM   Sallyanne Kuster 07/28/2021, 2:15 PM

## 2021-07-28 NOTE — Progress Notes (Addendum)
Progress Note  Patient Name: Lindsay [name] Date of Encounter: 07/28/2021  [facility] Cardiologist: Lesleigh Noe, MD   Subjective   Denies any CP or SOB. Breathing significantly improved.   Inpatient Medications    Scheduled Meds:  amLODipine  10 mg Oral Daily   aspirin  81 mg Oral Daily   atorvastatin  40 mg Oral Daily   budesonide (PULMICORT) nebulizer solution  0.25 mg Nebulization BID   Chlorhexidine Gluconate Cloth  6 each Topical Daily   DULoxetine  30 mg Oral q AM   empagliflozin  10 mg Oral Daily   enoxaparin (LOVENOX) injection  100 mg Subcutaneous Q24H   furosemide  80 mg Intravenous TID   gabapentin  600 mg Oral TID   hydrALAZINE  100 mg Oral Q8H   insulin aspart  0-5 Units Subcutaneous QHS   insulin aspart  0-9 Units Subcutaneous TID WC   insulin aspart  40 Units Subcutaneous TID WC   insulin glargine-yfgn  30 Units Subcutaneous QHS   ipratropium-albuterol  3 mL Nebulization BID   isosorbide mononitrate  60 mg Oral Daily   metoprolol tartrate  50 mg Oral BID   nystatin   Topical BID   sodium chloride flush  10-40 mL Intracatheter Q12H   tamsulosin  0.4 mg Oral Daily   Continuous Infusions:  sodium chloride 10 mL/hr at 07/16/21 2153   PRN Meds: sodium chloride, albuterol, aspirin-acetaminophen-caffeine, baclofen, morphine injection, nitroGLYCERIN, [DISCONTINUED] ondansetron **OR** ondansetron (ZOFRAN) IV, polyethylene glycol, sodium chloride flush   Vital Signs    Vitals:   07/28/21 0545 07/28/21 0620 07/28/21 0758 07/28/21 0806  BP:  114/66  134/73  Pulse: 63 67 72 72  Resp:  18 18   Temp:  98.2 F (36.8 C)    TempSrc:  Oral    SpO2: 96% 92%    Weight:  (!) 196.9 kg    Height:        Intake/Output Summary (Last 24 hours) at 07/28/2021 0845 Last data filed at 07/28/2021 0727 Gross per 24 hour  Intake --  Output 3500 ml  Net -3500 ml   Last 3 Weights 07/28/2021 07/27/2021 07/26/2021  Weight (lbs) 434 lb 1.4 oz 437 lb 13.3 oz 435 lb 6.5 oz   Weight (kg) 196.9 kg 198.6 kg 197.5 kg      Telemetry    NSR without significant ventricular ectopy - Personally Reviewed  ECG    NSR without significant ST-T wave changes - Personally Reviewed  Physical Exam   GEN: No acute distress.   Neck: No JVD Cardiac: RRR, no murmurs, rubs, or gallops.  Respiratory: Clear to auscultation bilaterally. GI: Soft, nontender, non-distended  MS: No edema; No deformity. Neuro:  Nonfocal  Psych: Normal affect   Labs    High Sensitivity Troponin:   Recent Labs  Lab 07/10/21 2300 07/11/21 0614 07/11/21 1656 07/11/21 2111 07/12/21 1723  TROPONINIHS 18* 13 9 9 9       Chemistry Recent Labs  Lab 07/26/21 0500 07/27/21 0650 07/28/21 0500  NA 138 140 139  K 3.8 3.6 3.8  CL 95* 100 97*  CO2 36* 34* 35*  GLUCOSE 194* 117* 167*  BUN 23* 16 17  CREATININE 1.03* 1.01* 1.11*  CALCIUM 8.8* 8.1* 8.7*  GFRNONAA >60 >60 >60  ANIONGAP 7 6 7      HematologyNo results for input(s): WBC, RBC, HGB, HCT, MCV, MCH, MCHC, RDW, PLT in the last 168 hours.  BNPNo results for input(s): BNP,  PROBNP in the last 168 hours.   DDimer No results for input(s): DDIMER in the last 168 hours.   Radiology    No results found.  Cardiac Studies   Echo 07/12/2021 1. Left ventricular ejection fraction, by estimation, is 65 to 70%. The  left ventricle has normal function. Left ventricular endocardial border  not optimally defined to evaluate regional wall motion. There is mild left  ventricular hypertrophy. Left  ventricular diastolic parameters were normal.   2. Right ventricular systolic function is normal. The right ventricular  size is normal. There is normal pulmonary artery systolic pressure. The  estimated right ventricular systolic pressure is 18.7 mmHg.   3. The mitral valve is grossly normal. Trivial mitral valve  regurgitation.   4. The aortic valve is tricuspid. Aortic valve regurgitation is not  visualized.   5. The inferior vena cava is  normal in size with greater than 50%  respiratory variability, suggesting right atrial pressure of 3 mmHg.   Patient Profile     40 y.o. female with PMH of CAD, ICM with improved EF, asthma, DM II on insulin, HLD, HTN, OSA, morbid obesity presented on 7/21 with acute hypoxic resp failure.   Assessment & Plan    Acute on chronic diastolic CHF  - I/O is not accurate, -37L so far, but 0 intake documented in the past 24 hours.   - weight down from 459 lbs to 434 lbs.  - IV lasix increased to 80mg  TID yesterday, urine output significant. Recommend continue on current therapy and follow weight closely. Limit fluid intake. Likely near euvolemic level, although still higher than her weight in June 415 lbs.   CAD: no anginal symptom  HTN: continue amlodipine, metoprolol and imdur.   HLD  DM II      For questions or updates, please contact [facility] Please consult [email or web address] for contact info under        Signed, 12-04-1985, PA  07/28/2021, 8:45 AM    Personally seen and examined. Agree with APP above with the following comments: Briefly Morbid Obesity HTN and DM, HLD, HFpEF  Patient notes that her breathing has improved; doesn't remember what her dry weight is.  She notes that with the increase in her lasix she is urinating frequently. Exam notable for obese female with distant heart sounds, CTAB, no LE edema Labs notable for Stable kidney function Would recommend  - continue antithypertensives - we will work to diurese to prior dry weight and re-evaluation - patient is working with dietician.  09/27/2021, MD Cardiologist [facility]  [address] [place], [address] [place], [place] [phone]  1:52 PM

## 2021-07-28 NOTE — Plan of Care (Signed)
Nutrition Education Note  RD consulted for nutrition education regarding uncontrolled type 2 diabetes and congestive heart failure.   Lab Results  Component Value Date   HGBA1C 8.3 (H) 07/11/2021   RD provided "Carbohydrate Counting for People with Diabetes" handout from the Academy of Nutrition and Dietetics. Discussed different food groups and their effects on blood sugar, emphasizing carbohydrate-containing foods. Provided list of carbohydrates and recommended serving sizes of common foods.  Discussed importance of controlled and consistent carbohydrate intake throughout the day. Provided examples of ways to balance meals/snacks and encouraged intake of high-fiber, whole grain complex carbohydrates.   RD provided "Low Sodium Nutrition Therapy" handout from the Academy of Nutrition and Dietetics. Reviewed patient's dietary recall. Provided examples on ways to decrease sodium intake in diet. Discouraged intake of processed foods and use of salt shaker. Encouraged fresh fruits and vegetables as well as whole grain sources of carbohydrates to maximize fiber intake.   RD discussed why it is important for patient to adhere to diet recommendations, and emphasized the role of fluids, foods to avoid, and importance of weighing self daily.   Teach back method used.  Expect good compliance.  Body mass index is 67.99 kg/m. Pt meets criteria for Obesity Class 3 based on current BMI.  Current diet order is Heart Healthy/Carb Modified, patient is consuming 100% of meals at this time. Labs and medications reviewed.   No further nutrition interventions warranted at this time. RD contact information provided. If additional nutrition issues arise, please re-consult RD.   Vertell Limber, RD, LDN (she/her/hers) Registered Dietitian I After-Hours/Weekend Pager # in [place]

## 2021-07-29 DIAGNOSIS — G4733 Obstructive sleep apnea (adult) (pediatric): Secondary | ICD-10-CM | POA: Diagnosis not present

## 2021-07-29 DIAGNOSIS — E782 Mixed hyperlipidemia: Secondary | ICD-10-CM | POA: Diagnosis not present

## 2021-07-29 DIAGNOSIS — J9601 Acute respiratory failure with hypoxia: Secondary | ICD-10-CM | POA: Diagnosis not present

## 2021-07-29 DIAGNOSIS — D649 Anemia, unspecified: Secondary | ICD-10-CM | POA: Diagnosis not present

## 2021-07-29 DIAGNOSIS — I5033 Acute on chronic diastolic (congestive) heart failure: Secondary | ICD-10-CM | POA: Diagnosis not present

## 2021-07-29 DIAGNOSIS — Z794 Long term (current) use of insulin: Secondary | ICD-10-CM | POA: Diagnosis not present

## 2021-07-29 DIAGNOSIS — E1140 Type 2 diabetes mellitus with diabetic neuropathy, unspecified: Secondary | ICD-10-CM | POA: Diagnosis not present

## 2021-07-29 DIAGNOSIS — I10 Essential (primary) hypertension: Secondary | ICD-10-CM | POA: Diagnosis not present

## 2021-07-29 DIAGNOSIS — J189 Pneumonia, unspecified organism: Secondary | ICD-10-CM | POA: Diagnosis not present

## 2021-07-29 DIAGNOSIS — I25119 Atherosclerotic heart disease of native coronary artery with unspecified angina pectoris: Secondary | ICD-10-CM | POA: Diagnosis not present

## 2021-07-29 LAB — BASIC METABOLIC PANEL
Anion gap: 6 (ref 5–15)
BUN: 17 mg/dL (ref 6–20)
CO2: 34 mmol/L — ABNORMAL HIGH (ref 22–32)
Calcium: 8.9 mg/dL (ref 8.9–10.3)
Chloride: 96 mmol/L — ABNORMAL LOW (ref 98–111)
Creatinine, Ser: 1.02 mg/dL — ABNORMAL HIGH (ref 0.44–1.00)
GFR, Estimated: >60 mL/min (ref >60)
Glucose, Bld: 237 mg/dL — ABNORMAL HIGH (ref 70–99)
Potassium: 3.6 mmol/L (ref 3.5–5.1)
Sodium: 136 mmol/L (ref 135–145)

## 2021-07-29 LAB — GLUCOSE, CAPILLARY
Glucose-Capillary: 249 mg/dL — ABNORMAL HIGH (ref 70–99)
Glucose-Capillary: 260 mg/dL — ABNORMAL HIGH (ref 70–99)
Glucose-Capillary: 245 mg/dL — ABNORMAL HIGH (ref 70–99)
Glucose-Capillary: 336 mg/dL — ABNORMAL HIGH (ref 70–99)

## 2021-07-29 LAB — MAGNESIUM: Magnesium: 1.8 mg/dL (ref 1.7–2.4)

## 2021-07-29 MED ORDER — INSULIN ASPART 100 UNIT/ML IJ SOLN
10.0000 [IU] | Freq: Three times a day (TID) | INTRAMUSCULAR | Status: DC
  Administered 2021-07-29 – 2021-07-30 (×3): 10 [IU] via SUBCUTANEOUS

## 2021-07-29 NOTE — Progress Notes (Signed)
Inpatient Diabetes Program Recommendations  AACE/ADA: New Consensus Statement on Inpatient Glycemic Control (2015)  Target Ranges:  Prepandial:   less than 140 mg/dL      Peak postprandial:   less than 180 mg/dL (1-2 hours)      Critically ill patients:  140 - 180 mg/dL   Lab Results  Component Value Date   GLUCAP 249 (H) 07/29/2021   HGBA1C 8.3 (H) 07/11/2021    Review of Glycemic Control Results for LATROYA, NG (MRN [id number]) as of 07/29/2021 09:56  Ref. Range 07/28/2021 11:33 07/28/2021 16:57 07/28/2021 21:11 07/29/2021 08:07  Glucose-Capillary Latest Ref Range: 70 - 99 mg/dL 073 (H) 710 (H) 626 (H) 249 (H)  Diabetes history: DM2 Outpatient Diabetes medications: Jardiance 10 mg QD, Humulin U-500 80 units QAM, 75 units with lunch and 75 units with supper, Victoza 1.8 mg QHS Current orders for Inpatient glycemic control: Novolog 0-9 units TID and 0-5 units QHS, Jardiance 10 mg QD, Semglee 30 units QHS   Inpatient Diabetes Program Recommendations:     Consider adding back Novolog 10 units TID (assuming patient consuming >50% of meals).  Thanks, Lujean Rave, MSN, RNC-OB Diabetes Coordinator [phone] (8a-5p)

## 2021-07-29 NOTE — Plan of Care (Signed)
  Problem: Elimination: Goal: Will not experience complications related to bowel motility Outcome: Progressing Goal: Will not experience complications related to urinary retention Outcome: Progressing   Problem: Pain Managment: Goal: General experience of comfort will improve Outcome: Progressing   

## 2021-07-29 NOTE — Progress Notes (Signed)
PROGRESS NOTE    LOLETTA [name]  MPN:[id number] DOB: [birth year] DOA: 07/10/2021 PCP: Grayce Sessions, NP    Brief Narrative:  Lindsay [name] is a 40 year old female with past medical history significant for CAD with NSTEMI 2017 s/p DES, ischemic cardiomyopathy with recovered EF, asthma, type 2 diabetes mellitus, HLD, essential hypertension, OSA, morbid obesity who presented to [facility] ED on 7/21 with progressive shortness of breath.  Patient reports no improvement with home albuterol MDI.  Also reports subjective fever.  Denies travel or sick contacts.  No chest pain, no palpitations, no diaphoresis, no abdominal pain, no nausea/vomiting/diarrhea, no dysuria, increased urinary frequency or hematuria.  In the ED, temperature 101.9 F, HR 110, RR 16, BP 175/90, SPO2 100% on simple facemask at 8 L/min.  Urinalysis with glucosuria greater than 500 glucose and proteinuria.  WBC 10.9, hemoglobin 9.7, platelets 206.  Lactic acid 2.9.  Troponin 12>18.  BNP 103.2.  Chest x-ray with diffuse interstitial infiltrate atypical infection versus more likely pulmonary edema.  Patient was started on IV ceftriaxone and azithromycin with 2 L LR bolus.   Assessment & Plan:   Principal Problem:   CAP (community acquired pneumonia) Active Problems:   Essential hypertension   DM neuropathy, type II diabetes mellitus (HCC)   Coronary artery disease involving native coronary artery of native heart with angina pectoris (HCC)   OSA (obstructive sleep apnea)   Hyperlipidemia   Normocytic anemia   Acute respiratory failure with hypoxia (HCC)   Acute on chronic diastolic heart failure (HCC)   Acute respiratory failure with hypoxia, POA Patient presenting with progressive shortness of breath, found to have a fever one 1.9 with chest x-ray findings concerning for pneumonia versus pulmonary edema.  Etiology likely multifactorial given decompensated congestive heart failure, pneumonia, morbid obesity with OSA/OHS.   Patient initially requiring 8 L simple facemask on arrival which has now been titrated off at rest.  Repeat VBG during hospitalization with continued elevated PCO2 of 50.0. --Budesonide neb twice daily --Albuterol neb every 4 hours as needed wheezing/shortness of breath --Continue treatment as below.  Community-acquired pneumonia Patient presenting with progressive shortness of breath, fever with temperature 101.9 F on admission.  Requiring 8 L simple facemask to maintain SPO2.  Mildly elevated WBC.  Chest x-ray with findings of atypical infection.  Completed 7-day course of azithromycin and ceftriaxone.  Now oxygenating well on room air.  Acute on chronic diastolic congestive heart failure Patient presenting with elevated BNP, lower extreme edema, shortness of breath.  History of systolic congestive heart failure that has now recovered. TTE 7/23: LVEF 65-70%, mild LVH, IVC normal --Cardiology following, appreciate assistance --net negative 3.2L past 24h and net negative 40L since admission --Wt 198.4>>206>>197.3>197.5>198.6>196.8>192.4kg --Furosemide increased to 80 mg IV TID on 8/7 --Strict I's and O's Daily weights --Follow BMP daily  CAD s/p PCI --Aspirin 81 mg p.o. daily --Atorvastatin 40 mg p.o. daily  Acute renal failure: Resolved Creatinine admission 0.88, worsened to 1.4 following aggressive IV fluid hydration in the ED.  Now improved with IV diuresis. --Cr 0.88>1.4>>0.99>1.03>1.01>1.11 --BMP daily while on IV diuresis  Urinary retention: Resolved During hospitalization, patient required Foley catheterization which was discontinued on 07/24/2021. --Started on tamsulosin --Continue monitor urinary output closely  Type 2 diabetes mellitus Hemoglobin A1c 8.3, poorly controlled.  Home regimen includes Jardiance 10 mg p.o. daily, Humulin U 500 ; 80 units every morning, 75 units with lunch and 75 units with dinner, Victoza 1.8 mg nightly. --Semglee 30u [place] daily --Novolog 10u  [place]  TID --Jardiance 10 mg p.o. daily --SSI for further coverage --CBGs qAC/HS  Essential hypertension: --Amlodipine 10 mg p.o. daily --Metoprolol tartrate 100 mg p.o. twice daily --Hydralazine 100 mg p.o. every 8 hours --Isosorbide mononitrate 60 mg p.o. daily  Anemia of chronic disease Patient admitted with hemoglobin 9.7, stable.  Last year hemoglobin 13.  Panel receipt revealed low iron levels.  Patient refused iron supplementation while in the hospital.  Hyponatremia: Resolved with diuresis.  Sodium 138 this morning.  Diabetic peripheral neuropathy:  --Gabapentin 600 mg p.o. 3 times daily --Cymbalta 30 mg p.o. every morning  Obstructive sleep apnea Obesity hypoventilation syndrome Patient with continued elevated CO2 on VBG's during the hospitalization.  Likely secondary to underlying OSA and OHS from severe morbid obesity. --Awaiting insurance authorization for Trilogy vent  Morbid obesity Body mass index is 66.42 kg/m. Discussed with patient needs for aggressive lifestyle changes/weight loss as this complicates all facets of care.  Outpatient follow-up with PCP.  May benefit from bariatric evaluation outpatient.  DVT prophylaxis: Lovenox   Code Status: Full Code Family Communication: No family present at bedside this morning.  Disposition Plan:  Level of care: Telemetry Cardiac Status is: Inpatient  Remains inpatient appropriate because:Unsafe d/c plan, IV treatments appropriate due to intensity of illness or inability to take PO, and Inpatient level of care appropriate due to severity of illness  Dispo: The patient is from: Home              Anticipated d/c is to: Home              Patient currently is not medically stable to d/c.   Difficult to place patient No     Consultants:  Cardiology  Procedures:  TTE 7/23: LVEF 65-70%, mild LVH, IVC normal Vas U/S lower extremities 7/22: Negative for DVT Left upper extremity vascular ultrasound: Negative for  DVT  Antimicrobials:  Azithromycin 7/21 - 7/27 Ceftriaxone 7/21 - 7/27 Fluconazole 7/30 - 8/4    Subjective: Patient seen examined at bedside, resting comfortably.  Lying in bed.  Reports continued good urine output.  Slept well overnight.  Continues to be volume overload although now titrated off of supplemental oxygen.  Seen by cardiology this morning with recommendations to continue IV diuresis 3 times daily today.  Continues to wait for insurance authorization for trilogy ventilator for home use.  No other questions or concerns at this time.  Denies headache, no visual changes, no chest pain, palpitations, no abdominal pain, no nausea/vomiting/diarrhea, no fever/chills/night sweats, no weakness, no fatigue, no paresthesias.  No acute events overnight per nursing staff.  Objective: Vitals:   07/29/21 0616 07/29/21 0810 07/29/21 0811 07/29/21 0828  BP: 124/63   137/67  Pulse:    85  Resp:      Temp:      TempSrc:      SpO2:  98% 98%   Weight:      Height:        Intake/Output Summary (Last 24 hours) at 07/29/2021 1403 Last data filed at 07/28/2021 2326 Gross per 24 hour  Intake 160 ml  Output 2250 ml  Net -2090 ml   Filed Weights   07/27/21 0300 07/28/21 0620 07/29/21 0448  Weight: (!) 198.6 kg (!) 196.9 kg (!) 192.4 kg    Examination:  General exam: Appears calm and comfortable  Respiratory system: Breath sounds slightly decreased bilateral bases, otherwise clear to auscultation. Respiratory effort normal.  On room air at rest Cardiovascular system: S1 &  S2 heard, RRR. No JVD, murmurs, rubs, gallops or clicks. No pedal edema. Gastrointestinal system: Abdomen is nondistended, soft and nontender. No organomegaly or masses felt. Normal bowel sounds heard. Central nervous system: Alert and oriented. No focal neurological deficits. Extremities: Symmetric 5 x 5 power. Skin: No rashes, lesions or ulcers Psychiatry: Judgement and insight appear normal. Mood & affect appropriate.      Data Reviewed: I have personally reviewed following labs and imaging studies  CBC: No results for input(s): WBC, NEUTROABS, HGB, HCT, MCV, PLT in the last 168 hours.  Basic Metabolic Panel: Recent Labs  Lab 07/25/21 0500 07/26/21 0500 07/27/21 0650 07/28/21 0500 07/29/21 0525  NA 138 138 140 139 136  K 3.7 3.8 3.6 3.8 3.6  CL 95* 95* 100 97* 96*  CO2 36* 36* 34* 35* 34*  GLUCOSE 114* 194* 117* 167* 237*  BUN 22* 23* 16 17 17   CREATININE 0.99 1.03* 1.01* 1.11* 1.02*  CALCIUM 8.9 8.8* 8.1* 8.7* 8.9  MG  --   --   --   --  1.8   GFR: Estimated Creatinine Clearance: 133.1 mL/min (A) (by C-G formula based on SCr of 1.02 mg/dL (H)). Liver Function Tests: No results for input(s): AST, ALT, ALKPHOS, BILITOT, PROT, ALBUMIN in the last 168 hours. No results for input(s): LIPASE, AMYLASE in the last 168 hours. No results for input(s): AMMONIA in the last 168 hours. Coagulation Profile: No results for input(s): INR, PROTIME in the last 168 hours. Cardiac Enzymes: No results for input(s): CKTOTAL, CKMB, CKMBINDEX, TROPONINI in the last 168 hours. BNP (last 3 results) No results for input(s): PROBNP in the last 8760 hours. HbA1C: No results for input(s): HGBA1C in the last 72 hours. CBG: Recent Labs  Lab 07/28/21 1133 07/28/21 1657 07/28/21 2111 07/29/21 0807 07/29/21 1209  GLUCAP 245* 313* 316* 249* 260*   Lipid Profile: No results for input(s): CHOL, HDL, LDLCALC, TRIG, CHOLHDL, LDLDIRECT in the last 72 hours.  Thyroid Function Tests: No results for input(s): TSH, T4TOTAL, FREET4, T3FREE, THYROIDAB in the last 72 hours. Anemia Panel: No results for input(s): VITAMINB12, FOLATE, FERRITIN, TIBC, IRON, RETICCTPCT in the last 72 hours. Sepsis Labs: No results for input(s): PROCALCITON, LATICACIDVEN in the last 168 hours.  No results found for this or any previous visit (from the past 240 hour(s)).       Radiology Studies: No results found.      Scheduled  Meds:  amLODipine  10 mg Oral Daily   aspirin  81 mg Oral Daily   atorvastatin  40 mg Oral Daily   budesonide (PULMICORT) nebulizer solution  0.25 mg Nebulization BID   Chlorhexidine Gluconate Cloth  6 each Topical Daily   DULoxetine  30 mg Oral q AM   empagliflozin  10 mg Oral Daily   enoxaparin (LOVENOX) injection  100 mg Subcutaneous Q24H   furosemide  80 mg Intravenous TID   gabapentin  600 mg Oral TID   hydrALAZINE  100 mg Oral Q8H   insulin aspart  0-5 Units Subcutaneous QHS   insulin aspart  0-9 Units Subcutaneous TID WC   insulin aspart  10 Units Subcutaneous TID WC   insulin glargine-yfgn  30 Units Subcutaneous QHS   ipratropium-albuterol  3 mL Nebulization BID   isosorbide mononitrate  60 mg Oral Daily   metoprolol tartrate  50 mg Oral BID   nystatin   Topical BID   sodium chloride flush  10-40 mL Intracatheter Q12H   tamsulosin  0.4 mg  Oral Daily   Continuous Infusions:  sodium chloride 10 mL/hr at 07/16/21 2153     LOS: 19 days    Time spent: 38 minutes spent on chart review, discussion with nursing staff, consultants, updating family and interview/physical exam; more than 50% of that time was spent in counseling and/or coordination of care.    Alvira Philips [place], DO [facility] Available via Epic secure chat 7am-7pm After these hours, please refer to coverage provider listed on [email or web address] 07/29/2021, 2:03 PM

## 2021-07-29 NOTE — Progress Notes (Addendum)
Progress Note  Patient Name: Lindsay [name] Date of Encounter: 07/29/2021  [facility] Cardiologist: Lesleigh Noe, MD   Subjective   Patient states she is feeling much improved, less SOB, off the oxygen. She states she is still urinating a lot. She felt no significant change of her leg edema. She states she felt dizzy when standing up and walking. She denied chest pain.   Inpatient Medications    Scheduled Meds:  amLODipine  10 mg Oral Daily   aspirin  81 mg Oral Daily   atorvastatin  40 mg Oral Daily   budesonide (PULMICORT) nebulizer solution  0.25 mg Nebulization BID   Chlorhexidine Gluconate Cloth  6 each Topical Daily   DULoxetine  30 mg Oral q AM   empagliflozin  10 mg Oral Daily   enoxaparin (LOVENOX) injection  100 mg Subcutaneous Q24H   furosemide  80 mg Intravenous TID   gabapentin  600 mg Oral TID   hydrALAZINE  100 mg Oral Q8H   insulin aspart  0-5 Units Subcutaneous QHS   insulin aspart  0-9 Units Subcutaneous TID WC   insulin glargine-yfgn  30 Units Subcutaneous QHS   ipratropium-albuterol  3 mL Nebulization BID   isosorbide mononitrate  60 mg Oral Daily   metoprolol tartrate  50 mg Oral BID   nystatin   Topical BID   sodium chloride flush  10-40 mL Intracatheter Q12H   tamsulosin  0.4 mg Oral Daily   Continuous Infusions:  sodium chloride 10 mL/hr at 07/16/21 2153   PRN Meds: sodium chloride, albuterol, aspirin-acetaminophen-caffeine, baclofen, morphine injection, nitroGLYCERIN, [DISCONTINUED] ondansetron **OR** ondansetron (ZOFRAN) IV, polyethylene glycol, sodium chloride flush   Vital Signs    Vitals:   07/28/21 2218 07/28/21 2336 07/29/21 0448 07/29/21 0616  BP: 117/60  120/62 124/63  Pulse:  74 68   Resp:  (!) 21 20   Temp:   97.7 F (36.5 C)   TempSrc:   Axillary   SpO2:  94% 98%   Weight:   (!) 192.4 kg   Height:        Intake/Output Summary (Last 24 hours) at 07/29/2021 0713 Last data filed at 07/28/2021 2326 Gross per 24 hour   Intake 160 ml  Output 3350 ml  Net -3190 ml    Last 3 Weights 07/29/2021 07/28/2021 07/27/2021  Weight (lbs) 424 lb 1.6 oz 434 lb 1.4 oz 437 lb 13.3 oz  Weight (kg) 192.37 kg 196.9 kg 198.6 kg      Telemetry    Sinus rhythm 80s, artifacts  - Personally Reviewed  ECG    No new tracings this AM- Personally Reviewed  Physical Exam   GEN: no acute distress, sitting in bed, morbidly obese  Neck: difficult to appreciate JVD due to short and thick neck  Cardiac: RRR, no murmurs, rubs, or gallops.  Respiratory: distant breath sounds due to body habitus GI: Soft, obese, nontender, non-distended  MS: trace  BLE edema; No deformity. Neuro:  Nonfocal  Psych: Normal affect   Labs    High Sensitivity Troponin:   Recent Labs  Lab 07/10/21 2300 07/11/21 0614 07/11/21 1656 07/11/21 2111 07/12/21 1723  TROPONINIHS 18* 13 9 9 9        Chemistry Recent Labs  Lab 07/27/21 0650 07/28/21 0500 07/29/21 0525  NA 140 139 136  K 3.6 3.8 3.6  CL 100 97* 96*  CO2 34* 35* 34*  GLUCOSE 117* 167* 237*  BUN 16 17 17   CREATININE  1.01* 1.11* 1.02*  CALCIUM 8.1* 8.7* 8.9  GFRNONAA >60 >60 >60  ANIONGAP 6 7 6       Hematology No results for input(s): WBC, RBC, HGB, HCT, MCV, MCH, MCHC, RDW, PLT in the last 168 hours.   BNP No results for input(s): BNP, PROBNP in the last 168 hours.    DDimer No results for input(s): DDIMER in the last 168 hours.   Radiology    No results found.  Cardiac Studies   Echo from 07/12/21:    1. Left ventricular ejection fraction, by estimation, is 65 to 70%. The  left ventricle has normal function. Left ventricular endocardial border  not optimally defined to evaluate regional wall motion. There is mild left  ventricular hypertrophy. Left ventricular diastolic parameters were normal.   2. Right ventricular systolic function is normal. The right ventricular  size is normal. There is normal pulmonary artery systolic pressure. The  estimated right  ventricular systolic pressure is 18.7 mmHg.   3. The mitral valve is grossly normal. Trivial mitral valve  regurgitation.   4. The aortic valve is tricuspid. Aortic valve regurgitation is not  visualized.   5. The inferior vena cava is normal in size with greater than 50%  respiratory variability, suggesting right atrial pressure of 3 mmHg.   LHC on 04/25/2020:   LAD stent is widely patent with perhaps eccentric 20% narrowing in some views.  The jailed second diagonal is widely patent.  Proximal first diagonal contains ostial to proximal segmental 60% narrowing Normal left main Moderate irregularities in the circumflex up to 40% in the first marginal. Dominant right coronary, luminal irregularities. Normal LV function with EF 60%.  Normal LVEDP.   RECOMMENDATIONS:   Continue aggressive risk factor modification including greater than 150 minutes of moderate activity 5 out of 7 days of the week, weight loss, LDL less than 70, blood pressure control, and management of potential sleep apnea. Chest pain and dyspnea do not appear to be related to obstructive disease or heart failure respectively.  Other explanations should be sought.   Patient Profile     40 y.o. female with PMH of CAD (NSTEMI in 9/ 2017 s/p DES to LAD and POBA of D2, diastolic heart failure, ischemic cardiomyopathy with recovered EF,  asthma, insulin requiring type II DM, hyperlipidemia, hypertension, OSA, morbid obesity, cardiology is following for decompensated CHF.   Assessment & Plan    Dizziness, new  - will check orthostatic VS today   Acute hypoxic respiratory failure - presented with 24 hours onset of SOB, cough, fever, chills and 07/10/2021, found to be tachypneic with borderline hypoxic 90%, hypertensive 159/71, febrile 101.1 at admission.  - CXR with interval development of diffuse interstitial infiltrate representing atypical infection versus edema, POA.  - multifactorial from CHF decompensation, CAP,   underlying OSA, OHS.  - Weaned to RA now, completed a course of IV antibiotics for CAP. CHF management as below   Acute diastolic heart failure - BNP elevated to 190 on 07/17/21.  - Echo from 07/12/2021 revealed EF 65 to 70%, difficult assess regional wall motion, normal PASP, trivial MR.  - She has been diuresing with IV lasix , Net -39L this admission, UOP 3.3 L over the past 24 hours, Weight 458 lbs >424 ibs since admission. Volume status difficult to determine due to body habitus, appears improving overall  - Will continue IV lasix 80mg  TID until BUN/Cr rise - Continue monitor strict intake and output, daily weight, daily electrolytes - GDMT:  Continue metoprolol 50 mg BID,  Imdur 60mg , hydralazine 100mg  TID, Jardiance 10 mg daily   CAD s/p PCI/DES to LAD 2017 - no acute issue - continue medical therapy with aspirin 81 mg, lipitor 40 mg, metoprolol 50 mg BID, Imdur 60 mg daily, amlodipine 10 mg daily   Hypertension - BP stable this admission - Continue amlodipine 10 mg daily, metoprolol 50 mg BID, Imdur 60 mg daily, IV Lasix 80 mg TID, hydralazine 100 mg 3 times daily  Hyperlipidemia - LDL 68 ton 07/23/21  - Continue Lipitor 40 mg daily  Type 2 DM - A1C 8.3% from 07/11/21  - managed per IM       For questions or updates, please contact [facility] Please consult [email or web address] for contact info under        Signed, 09/22/21, NP  07/29/2021, 7:13 AM    Personally seen and examined. Agree with APP above with the following comments: Patient is still total body volume overloaded but improving every day.  We suspect she will hit her dry weight of 415 lbs shortly.  Post admission, she should likely be safe for her eye surgery on 08/14/21.  09/28/2021, MD Cardiologist [facility]  [address] [place], [address] [place], [place] [phone]  12:44 PM

## 2021-07-30 DIAGNOSIS — J9601 Acute respiratory failure with hypoxia: Secondary | ICD-10-CM | POA: Diagnosis not present

## 2021-07-30 DIAGNOSIS — J189 Pneumonia, unspecified organism: Secondary | ICD-10-CM | POA: Diagnosis not present

## 2021-07-30 DIAGNOSIS — E1140 Type 2 diabetes mellitus with diabetic neuropathy, unspecified: Secondary | ICD-10-CM | POA: Diagnosis not present

## 2021-07-30 DIAGNOSIS — I5033 Acute on chronic diastolic (congestive) heart failure: Secondary | ICD-10-CM | POA: Diagnosis not present

## 2021-07-30 DIAGNOSIS — Z794 Long term (current) use of insulin: Secondary | ICD-10-CM | POA: Diagnosis not present

## 2021-07-30 LAB — CBC
HCT: 28.8 % — ABNORMAL LOW (ref 36.0–46.0)
Hemoglobin: 8.7 g/dL — ABNORMAL LOW (ref 12.0–15.0)
MCH: 25.8 pg — ABNORMAL LOW (ref 26.0–34.0)
MCHC: 30.2 g/dL (ref 30.0–36.0)
MCV: 85.5 fL (ref 80.0–100.0)
Platelets: 259 10*3/uL (ref 150–400)
RBC: 3.37 MIL/uL — ABNORMAL LOW (ref 3.87–5.11)
RDW: 14.8 % (ref 11.5–15.5)
WBC: 4 10*3/uL (ref 4.0–10.5)
nRBC: 0 % (ref 0.0–0.2)

## 2021-07-30 LAB — BASIC METABOLIC PANEL
Anion gap: 7 (ref 5–15)
BUN: 15 mg/dL (ref 6–20)
CO2: 34 mmol/L — ABNORMAL HIGH (ref 22–32)
Calcium: 9 mg/dL (ref 8.9–10.3)
Chloride: 96 mmol/L — ABNORMAL LOW (ref 98–111)
Creatinine, Ser: 0.99 mg/dL (ref 0.44–1.00)
GFR, Estimated: >60 mL/min (ref >60)
Glucose, Bld: 236 mg/dL — ABNORMAL HIGH (ref 70–99)
Potassium: 3.6 mmol/L (ref 3.5–5.1)
Sodium: 137 mmol/L (ref 135–145)

## 2021-07-30 LAB — GLUCOSE, CAPILLARY
Glucose-Capillary: 255 mg/dL — ABNORMAL HIGH (ref 70–99)
Glucose-Capillary: 256 mg/dL — ABNORMAL HIGH (ref 70–99)
Glucose-Capillary: 217 mg/dL — ABNORMAL HIGH (ref 70–99)
Glucose-Capillary: 254 mg/dL — ABNORMAL HIGH (ref 70–99)

## 2021-07-30 LAB — MAGNESIUM: Magnesium: 1.8 mg/dL (ref 1.7–2.4)

## 2021-07-30 MED ORDER — INSULIN ASPART 100 UNIT/ML IJ SOLN
14.0000 [IU] | Freq: Three times a day (TID) | INTRAMUSCULAR | Status: DC
  Administered 2021-07-30 – 2021-07-31 (×3): 14 [IU] via SUBCUTANEOUS

## 2021-07-30 MED ORDER — INSULIN GLARGINE-YFGN 100 UNIT/ML ~~LOC~~ SOLN
40.0000 [IU] | Freq: Every day | SUBCUTANEOUS | Status: DC
  Administered 2021-07-30: 40 [IU] via SUBCUTANEOUS
  Filled 2021-07-30 (×3): qty 0.4

## 2021-07-30 NOTE — Progress Notes (Addendum)
PROGRESS NOTE    Lindsay [name]  ERX:[id number] DOB: [birth year] DOA: 07/10/2021 PCP: Grayce Sessions, NP    Brief Narrative:   Lindsay [name] is a 40 year old female with past medical history significant for CAD with NSTEMI 2017 s/p DES, ischemic cardiomyopathy with recovered EF, asthma, type 2 diabetes mellitus, HLD, essential hypertension, OSA, morbid obesity who presented to [facility] ED on 7/21 with progressive shortness of breath.  Patient reports no improvement with home albuterol MDI.  Also reports subjective fever.  Denies travel or sick contacts.  No chest pain, no palpitations, no diaphoresis, no abdominal pain, no nausea/vomiting/diarrhea, no dysuria, increased urinary frequency or hematuria.  In the ED, temperature 101.9 F, HR 110, RR 16, BP 175/90, SPO2 100% on simple facemask at 8 L/min.  Urinalysis with glucosuria greater than 500 glucose and proteinuria.  WBC 10.9, hemoglobin 9.7, platelets 206.  Lactic acid 2.9.  Troponin 12>18.  BNP 103.2.  Chest x-ray with diffuse interstitial infiltrate atypical infection versus more likely pulmonary edema.  Patient was started on IV ceftriaxone and azithromycin with 2 L LR bolus.   Assessment & Plan:   Principal Problem:   CAP (community acquired pneumonia) Active Problems:   Essential hypertension   DM neuropathy, type II diabetes mellitus (HCC)   Coronary artery disease involving native coronary artery of native heart with angina pectoris (HCC)   OSA (obstructive sleep apnea)   Hyperlipidemia   Normocytic anemia   Acute respiratory failure with hypoxia (HCC)   Acute on chronic diastolic heart failure (HCC)   Acute respiratory failure with hypoxia, POA Patient presenting with progressive shortness of breath, found to have a fever one 1.9 with chest x-ray findings concerning for pneumonia versus pulmonary edema.  Etiology likely multifactorial given decompensated congestive heart failure, pneumonia, morbid obesity with OSA/OHS.   Patient initially requiring 8 L simple facemask on arrival which has now been titrated off at rest.  Repeat VBG during hospitalization with continued elevated PCO2 of 50.0. --Budesonide neb twice daily --Albuterol neb every 4 hours as needed wheezing/shortness of breath --Continue treatment as below. -She is currently on room air.  Community-acquired pneumonia Patient presenting with progressive shortness of breath, fever with temperature 101.9 F on admission.  Requiring 8 L simple facemask to maintain SPO2.  Mildly elevated WBC.  Chest x-ray with findings of atypical infection.  Completed 7-day course of azithromycin and ceftriaxone.  Now oxygenating well on room air.  Acute on chronic diastolic congestive heart failure Patient presenting with elevated BNP, lower extreme edema, shortness of breath.  History of systolic congestive heart failure that has now recovered. TTE 7/23: LVEF 65-70%, mild LVH, IVC normal --Cardiology following, appreciate assistance -- Diuresis per cardiology, she remains on 80 mg IV 3 times daily, likely for another 24 hours, then she likely will be transitioned to oral. -- Continue with daily weight, strict ins and out, he is -39 L since admission, currently improved 458 pounds >419 pounds, her dry weight is 415.  CAD s/p PCI --Aspirin 81 mg p.o. daily --Atorvastatin 40 mg p.o. daily  Acute renal failure: Resolved Creatinine admission 0.88, worsened to 1.4 following aggressive IV fluid hydration in the ED.  Now improved with IV diuresis. --Cr 0.88>1.4>>0.99>1.03>1.01>1.11 --BMP daily while on IV diuresis  Urinary retention: Resolved During hospitalization, patient required Foley catheterization which was discontinued on 07/24/2021. --Started on tamsulosin --Continue monitor urinary output closely  Type 2 diabetes mellitus Hemoglobin A1c 8.3, poorly controlled.  Home regimen includes Jardiance 10 mg p.o.  daily, Humulin U 500 ; 80 units every morning, 75 units  with lunch and 75 units with dinner, Victoza 1.8 mg nightly. -her CBGs remain poorly controlled, so we will increase her Lantus to 40 units at bedtime, and will increase before meals NovoLog to 14 units 3 times daily. --Jardiance 10 mg p.o. daily --SSI for further coverage --CBGs qAC/HS  Essential hypertension: --Amlodipine 10 mg p.o. daily --Metoprolol tartrate 100 mg p.o. twice daily --Hydralazine 100 mg p.o. every 8 hours --Isosorbide mononitrate 60 mg p.o. daily  Anemia of chronic disease Patient admitted with hemoglobin 9.7, stable.  Last year hemoglobin 13.  Panel receipt revealed low iron levels.  Patient refused iron supplementation while in the hospital.  Hyponatremia: Resolved with diuresis.  Sodium 138 this morning.  Diabetic peripheral neuropathy:  --Gabapentin 600 mg p.o. 3 times daily --Cymbalta 30 mg p.o. every morning  Obstructive sleep apnea Obesity hypoventilation syndrome Patient with continued elevated CO2 on VBG's during the hospitalization.  Likely secondary to underlying OSA and OHS from severe morbid obesity. --Awaiting insurance authorization for Trilogy vent  Morbid obesity Body mass index is 65.7 kg/m. Discussed with patient needs for aggressive lifestyle changes/weight loss as this complicates all facets of care.  Outpatient follow-up with PCP.  May benefit from bariatric evaluation outpatient.  DVT prophylaxis: Lovenox   Code Status: Full Code Family Communication: No family present at bedside this morning.  Disposition Plan:  Level of care: Telemetry Cardiac Status is: Inpatient  Remains inpatient appropriate because:Unsafe d/c plan, IV treatments appropriate due to intensity of illness or inability to take PO, and Inpatient level of care appropriate due to severity of illness  Dispo: The patient is from: Home              Anticipated d/c is to: Home              Patient currently is not medically stable to d/c.   Difficult to place patient  No     Consultants:  Cardiology  Procedures:  TTE 7/23: LVEF 65-70%, mild LVH, IVC normal Vas U/S lower extremities 7/22: Negative for DVT Left upper extremity vascular ultrasound: Negative for DVT  Antimicrobials:  Azithromycin 7/21 - 7/27 Ceftriaxone 7/21 - 7/27 Fluconazole 7/30 - 8/4    Subjective: Reports good night sleep, denies any chest pain, no fever, no chills.  Reports she is tolerating CPAP machine  Objective: Vitals:   07/30/21 0640 07/30/21 0811 07/30/21 0818 07/30/21 0820  BP:  128/64    Pulse: 79 77    Resp:      Temp:      TempSrc:      SpO2: 94%  95% 100%  Weight: (!) 190.3 kg     Height:        Intake/Output Summary (Last 24 hours) at 07/30/2021 1211 Last data filed at 07/30/2021 0700 Gross per 24 hour  Intake 120 ml  Output 2725 ml  Net -2605 ml   Filed Weights   07/28/21 0620 07/29/21 0448 07/30/21 0640  Weight: (!) 196.9 kg (!) 192.4 kg (!) 190.3 kg    Examination:  Awake Alert, Oriented X 3, No new F.N deficits, Normal affect Symmetrical Chest wall movement, Good air movement bilaterally, CTAB RRR,No Gallops,Rubs or new Murmurs, No Parasternal Heave +ve B.Sounds, Abd Soft, No tenderness, No rebound - guarding or rigidity. No Cyanosis, Clubbing ,trace edema, No new Rash or bruise       Data Reviewed: I have personally reviewed following labs and imaging  studies  CBC: Recent Labs  Lab 07/30/21 0500  WBC 4.0  HGB 8.7*  HCT 28.8*  MCV 85.5  PLT 259    Basic Metabolic Panel: Recent Labs  Lab 07/26/21 0500 07/27/21 0650 07/28/21 0500 07/29/21 0525 07/30/21 0500  NA 138 140 139 136 137  K 3.8 3.6 3.8 3.6 3.6  CL 95* 100 97* 96* 96*  CO2 36* 34* 35* 34* 34*  GLUCOSE 194* 117* 167* 237* 236*  BUN 23* 16 17 17 15   CREATININE 1.03* 1.01* 1.11* 1.02* 0.99  CALCIUM 8.8* 8.1* 8.7* 8.9 9.0  MG  --   --   --  1.8 1.8   GFR: Estimated Creatinine Clearance: 136.2 mL/min (by C-G formula based on SCr of 0.99 mg/dL). Liver  Function Tests: No results for input(s): AST, ALT, ALKPHOS, BILITOT, PROT, ALBUMIN in the last 168 hours. No results for input(s): LIPASE, AMYLASE in the last 168 hours. No results for input(s): AMMONIA in the last 168 hours. Coagulation Profile: No results for input(s): INR, PROTIME in the last 168 hours. Cardiac Enzymes: No results for input(s): CKTOTAL, CKMB, CKMBINDEX, TROPONINI in the last 168 hours. BNP (last 3 results) No results for input(s): PROBNP in the last 8760 hours. HbA1C: No results for input(s): HGBA1C in the last 72 hours. CBG: Recent Labs  Lab 07/29/21 1209 07/29/21 1642 07/29/21 2118 07/30/21 0755 07/30/21 1209  GLUCAP 260* 245* 336* 255* 256*   Lipid Profile: No results for input(s): CHOL, HDL, LDLCALC, TRIG, CHOLHDL, LDLDIRECT in the last 72 hours.  Thyroid Function Tests: No results for input(s): TSH, T4TOTAL, FREET4, T3FREE, THYROIDAB in the last 72 hours. Anemia Panel: No results for input(s): VITAMINB12, FOLATE, FERRITIN, TIBC, IRON, RETICCTPCT in the last 72 hours. Sepsis Labs: No results for input(s): PROCALCITON, LATICACIDVEN in the last 168 hours.  No results found for this or any previous visit (from the past 240 hour(s)).       Radiology Studies: No results found.      Scheduled Meds:  amLODipine  10 mg Oral Daily   aspirin  81 mg Oral Daily   atorvastatin  40 mg Oral Daily   budesonide (PULMICORT) nebulizer solution  0.25 mg Nebulization BID   Chlorhexidine Gluconate Cloth  6 each Topical Daily   DULoxetine  30 mg Oral q AM   empagliflozin  10 mg Oral Daily   enoxaparin (LOVENOX) injection  100 mg Subcutaneous Q24H   furosemide  80 mg Intravenous TID   gabapentin  600 mg Oral TID   hydrALAZINE  100 mg Oral Q8H   insulin aspart  0-5 Units Subcutaneous QHS   insulin aspart  0-9 Units Subcutaneous TID WC   insulin aspart  10 Units Subcutaneous TID WC   insulin glargine-yfgn  30 Units Subcutaneous QHS   ipratropium-albuterol  3  mL Nebulization BID   isosorbide mononitrate  60 mg Oral Daily   metoprolol tartrate  50 mg Oral BID   nystatin   Topical BID   sodium chloride flush  10-40 mL Intracatheter Q12H   tamsulosin  0.4 mg Oral Daily   Continuous Infusions:  sodium chloride 10 mL/hr at 07/16/21 2153     LOS: 20 days      2154, MD [facility] Available via Epic secure chat 7am-7pm After these hours, please refer to coverage provider listed on [email or web address] 07/30/2021, 12:11 PM

## 2021-07-30 NOTE — Progress Notes (Signed)
Physical Therapy Treatment & Discharge Patient Details Name: Lindsay [name] MRN: [id number] DOB: 01/26/1981 Today's Date: 07/30/2021    History of Present Illness Pt is a 40 y.o. female who presented 07/10/21 with fever and progressively worsening dyspnea since 07/09/21 that did not respond to her home albuterol. Workup for CAP, acute on chronic diastolic CHF. PMH includes asthma, RUE cellulitis, CAD, NSTEMI (2017) s/p stent, DM2, HLD, HTN, obesity.   PT Comments    Pt progressing well with mobility. Mod indep with mobility using rollator. Pt demonstrates good awareness of activity tolerance. Reviewed educ re: activity recommendations, energy conservation strategies. Pt reports no further questions or concerns; has met short-term acute PT goals. Will d/c acute PT.  SpO2 91-96% on RA    Follow Up Recommendations  Home health PT     Equipment Recommendations  None recommended by PT    Recommendations for Other Services       Precautions / Restrictions Restrictions Weight Bearing Restrictions: No    Mobility  Bed Mobility Overal bed mobility: Modified Independent                  Transfers Overall transfer level: Modified independent Equipment used: 4-wheeled walker;None Transfers: Sit to/from Stand              Ambulation/Gait Ambulation/Gait assistance: Modified independent (Device/Increase time) Gait Distance (Feet): 1000 Feet Assistive device: 4-wheeled walker Gait Pattern/deviations: Step-through pattern;Decreased stride length;Wide base of support     General Gait Details: Slow, steady gait, mod indep with rollator; pt ambulating >1000' with 4x seated rest breaks secondary to fatigue and SOB; pt with good ability to self-monitor activity tolerance and need for rest breaks; great rollator management and safety   Stairs Stairs:  (pt declined stair training; reviewed safe technique for 2 steps into home)           Wheelchair Mobility     Modified Rankin (Stroke Patients Only)       Balance Overall balance assessment: Modified Independent                                          Cognition Arousal/Alertness: Awake/alert Behavior During Therapy: WFL for tasks assessed/performed Overall Cognitive Status: Within Functional Limits for tasks assessed                                        Exercises      General Comments General comments (skin integrity, edema, etc.): Reviewed SpO2, energy conservation strategies (handout provided), activity recommendations (including walking program); SpO2 91-96% with activity      Pertinent Vitals/Pain Pain Assessment: No/denies pain Pain Intervention(s): Monitored during session    Home Living                      Prior Function            PT Goals (current goals can now be found in the care plan section) Progress towards PT goals: Goals met/education completed, patient discharged from PT    Frequency    Min 3X/week      PT Plan Current plan remains appropriate    Co-evaluation              AM-PAC PT "6 Clicks" Mobility   Outcome Measure  Help needed turning from your back to your side while in a flat bed without using bedrails?: None Help needed moving from lying on your back to sitting on the side of a flat bed without using bedrails?: None Help needed moving to and from a bed to a chair (including a wheelchair)?: None Help needed standing up from a chair using your arms (e.g., wheelchair or bedside chair)?: None Help needed to walk in hospital room?: None Help needed climbing 3-5 steps with a railing? : A Little 6 Click Score: 23    End of Session   Activity Tolerance: Patient tolerated treatment well Patient left: in bed;with call bell/phone within reach Nurse Communication: Mobility status PT Visit Diagnosis: Unsteadiness on feet (R26.81);Other abnormalities of gait and mobility (R26.89);Muscle  weakness (generalized) (M62.81);Difficulty in walking, not elsewhere classified (R26.2)     Time: 6144-3154 PT Time Calculation (min) (ACUTE ONLY): 47 min  Charges:  $Gait Training: 8-22 mins $Therapeutic Activity: 8-22 mins $Self Care/Home Management: [place], PT, DPT Acute Rehabilitation Services  Pager [phone] Office [place] 07/30/2021, 11:34 AM

## 2021-07-30 NOTE — Progress Notes (Addendum)
Progress Note  Patient Name: Lindsay [name] Date of Encounter: 07/30/2021  [facility] Cardiologist: Lesleigh Noe, MD   Subjective   Patient states she is feeling well, less dizzy today, able to tolerate walking in the room. She states her sleeping machine is approved.   Inpatient Medications    Scheduled Meds:  amLODipine  10 mg Oral Daily   aspirin  81 mg Oral Daily   atorvastatin  40 mg Oral Daily   budesonide (PULMICORT) nebulizer solution  0.25 mg Nebulization BID   Chlorhexidine Gluconate Cloth  6 each Topical Daily   DULoxetine  30 mg Oral q AM   empagliflozin  10 mg Oral Daily   enoxaparin (LOVENOX) injection  100 mg Subcutaneous Q24H   furosemide  80 mg Intravenous TID   gabapentin  600 mg Oral TID   hydrALAZINE  100 mg Oral Q8H   insulin aspart  0-5 Units Subcutaneous QHS   insulin aspart  0-9 Units Subcutaneous TID WC   insulin aspart  10 Units Subcutaneous TID WC   insulin glargine-yfgn  30 Units Subcutaneous QHS   ipratropium-albuterol  3 mL Nebulization BID   isosorbide mononitrate  60 mg Oral Daily   metoprolol tartrate  50 mg Oral BID   nystatin   Topical BID   sodium chloride flush  10-40 mL Intracatheter Q12H   tamsulosin  0.4 mg Oral Daily   Continuous Infusions:  sodium chloride 10 mL/hr at 07/16/21 2153   PRN Meds: sodium chloride, albuterol, aspirin-acetaminophen-caffeine, baclofen, morphine injection, nitroGLYCERIN, [DISCONTINUED] ondansetron **OR** ondansetron (ZOFRAN) IV, polyethylene glycol, sodium chloride flush   Vital Signs    Vitals:   07/29/21 2356 07/30/21 0440 07/30/21 0635 07/30/21 0640  BP:  114/60 128/63   Pulse: 82 66 72 79  Resp: (!) 21 18 18    Temp:  98 F (36.7 C) 98.3 F (36.8 C)   TempSrc:  Axillary Oral   SpO2: 96% 99% 93% 94%  Weight:    (!) 190.3 kg  Height:        Intake/Output Summary (Last 24 hours) at 07/30/2021 0810 Last data filed at 07/30/2021 0700 Gross per 24 hour  Intake 120 ml  Output  2725 ml  Net -2605 ml   Last 3 Weights 07/30/2021 07/29/2021 07/28/2021  Weight (lbs) 419 lb 8 oz 424 lb 1.6 oz 434 lb 1.4 oz  Weight (kg) 190.284 kg 192.37 kg 196.9 kg      Telemetry    Sinus rhythm 70s, artifacts, occasional PVCs. - Personally Reviewed  ECG    No new tracings this AM- Personally Reviewed  Physical Exam   GEN: no acute distress, sitting in bed, morbidly obese  Neck: difficult to appreciate JVD due to short and thick neck  Cardiac: RRR, no murmurs, rubs, or gallops.  Respiratory: distant but clear lungs sounds due to body habitus GI: Soft, obese, nontender, non-distended  MS: 1-  BLE edema; No deformity. Neuro:  Nonfocal  Psych: Normal affect   Labs    High Sensitivity Troponin:   Recent Labs  Lab 07/10/21 2300 07/11/21 0614 07/11/21 1656 07/11/21 2111 07/12/21 1723  TROPONINIHS 18* 13 9 9 9       Chemistry Recent Labs  Lab 07/28/21 0500 07/29/21 0525 07/30/21 0500  NA 139 136 137  K 3.8 3.6 3.6  CL 97* 96* 96*  CO2 35* 34* 34*  GLUCOSE 167* 237* 236*  BUN 17 17 15   CREATININE 1.11* 1.02* 0.99  CALCIUM 8.7*  8.9 9.0  GFRNONAA >60 >60 >60  ANIONGAP 7 6 7      Hematology Recent Labs  Lab 07/30/21 0500  WBC 4.0  RBC 3.37*  HGB 8.7*  HCT 28.8*  MCV 85.5  MCH 25.8*  MCHC 30.2  RDW 14.8  PLT 259    BNP No results for input(s): BNP, PROBNP in the last 168 hours.    DDimer No results for input(s): DDIMER in the last 168 hours.   Radiology    No results found.  Cardiac Studies   Echo from 07/12/21:    1. Left ventricular ejection fraction, by estimation, is 65 to 70%. The  left ventricle has normal function. Left ventricular endocardial border  not optimally defined to evaluate regional wall motion. There is mild left  ventricular hypertrophy. Left ventricular diastolic parameters were normal.   2. Right ventricular systolic function is normal. The right ventricular  size is normal. There is normal pulmonary artery systolic  pressure. The  estimated right ventricular systolic pressure is 18.7 mmHg.   3. The mitral valve is grossly normal. Trivial mitral valve  regurgitation.   4. The aortic valve is tricuspid. Aortic valve regurgitation is not  visualized.   5. The inferior vena cava is normal in size with greater than 50%  respiratory variability, suggesting right atrial pressure of 3 mmHg.   LHC on 04/25/2020:   LAD stent is widely patent with perhaps eccentric 20% narrowing in some views.  The jailed second diagonal is widely patent.  Proximal first diagonal contains ostial to proximal segmental 60% narrowing Normal left main Moderate irregularities in the circumflex up to 40% in the first marginal. Dominant right coronary, luminal irregularities. Normal LV function with EF 60%.  Normal LVEDP.   RECOMMENDATIONS:   Continue aggressive risk factor modification including greater than 150 minutes of moderate activity 5 out of 7 days of the week, weight loss, LDL less than 70, blood pressure control, and management of potential sleep apnea. Chest pain and dyspnea do not appear to be related to obstructive disease or heart failure respectively.  Other explanations should be sought.   Patient Profile     40 y.o. female with PMH of CAD (NSTEMI in 9/ 2017 s/p DES to LAD and POBA of D2, diastolic heart failure, ischemic cardiomyopathy with recovered EF,  asthma, insulin requiring type II DM, hyperlipidemia, hypertension, OSA, morbid obesity, cardiology is following for decompensated CHF.   Assessment & Plan    Dizziness, improved  - negative for orthostatic hypotension 8/9  Acute hypoxic respiratory failure - presented with 24 hours onset of SOB, cough, fever, chills and 07/10/2021, found to be tachypneic with borderline hypoxic 90%, hypertensive 159/71, febrile 101.1 at admission.  - CXR with interval development of diffuse interstitial infiltrate representing atypical infection versus edema, POA.  -  multifactorial from CHF decompensation, CAP,  underlying OSA, OHS.  - Weaned to RA now, completed a course of IV antibiotics for CAP. CHF management as below   Acute diastolic heart failure - BNP elevated to 190 on 07/17/21.  - Echo from 07/12/2021 revealed EF 65 to 70%, difficult assess regional wall motion, normal PASP, trivial MR.  - She has been diuresing with IV lasix , Net -39L this admission, UOP 3.3 L over the past 24 hours, Weight 458 lbs >419 ibs since admission, dry weight 415 ibs. Volume status difficult to determine due to body habitus, appears improving overall  - Continue monitor strict intake and output, daily weight, daily electrolytes -  GDMT: Continue metoprolol 50 mg BID,  Imdur 60mg , hydralazine 100mg  TID, Jardiance 10 mg daily - Will continue IV lasix 80mg  TID for another 24 hours before transition to PO, likely can be discharged tomorrow  - Follow up with cardiology arranged on 08/15/21    CAD s/p PCI/DES to LAD 2017 - no acute issue - continue medical therapy with aspirin 81 mg, lipitor 40 mg, metoprolol 50 mg BID, Imdur 60 mg daily, amlodipine 10 mg daily   Hypertension - BP stable/controlled  - Continue amlodipine 10 mg daily, metoprolol 50 mg BID, Imdur 60 mg daily, IV Lasix 80 mg TID, hydralazine 100 mg 3 times daily  Hyperlipidemia - LDL 68 ton 07/23/21  - Continue Lipitor 40 mg daily  Type 2 DM - A1C 8.3% from 07/11/21  - managed per IM       For questions or updates, please contact [facility] Please consult [email or web address] for contact info under        Signed, 2018, NP  07/30/2021, 8:10 AM     Personally seen and examined. Agree with APP above with the following comments: Briefly 39 yo F with improved SOB and approaching her dry weight Patient notes improvement in her leg swelling. Planned for one more day of IV lasix with transition to outpatient diuretic (potentially torsemide 40 mg PO Daily vs BID) with BMP as outpatient in follow  up  Nearing DC; has f/u visit for 08/15/21.  09/29/2021, MD Cardiologist [facility]  [address] [place], [address] [place], [address] [place] [phone]  12:53 PM

## 2021-07-30 NOTE — Progress Notes (Signed)
Inpatient Diabetes Program Recommendations  AACE/ADA: New Consensus Statement on Inpatient Glycemic Control (2015)  Target Ranges:  Prepandial:   less than 140 mg/dL      Peak postprandial:   less than 180 mg/dL (1-2 hours)      Critically ill patients:  140 - 180 mg/dL   Lab Results  Component Value Date   GLUCAP 255 (H) 07/30/2021   HGBA1C 8.3 (H) 07/11/2021    Review of Glycemic Control Results for Lindsay [name], Lindsay [name] (MRN [id number]) as of 07/30/2021 11:14  Ref. Range 07/29/2021 08:07 07/29/2021 12:09 07/29/2021 16:42 07/29/2021 21:18 07/30/2021 07:55  Glucose-Capillary Latest Ref Range: 70 - 99 mg/dL 397 (H)  Novolog 3 units 260 (H)  Novolog 15 units 245 (H)  Novolog 3 units 336 (H)  Novolog 4 units 255 (H)  Novolog 15 units   Diabetes history: DM2 Outpatient Diabetes medications: Jardiance 10 mg QD, Humulin U-500 80 units QAM, 75 units with lunch and 75 units with supper, Victoza 1.8 mg QHS Current orders for Inpatient glycemic control: Novolog 0-9 units TID and 0-5 units QHS, Novolog 10 units tid meal coverage, Jardiance 10 mg QD, Semglee 30 units QHS  Inpatient Diabetes Program Recommendations:     Note meal coverage not being consistently given  -   Consider increasing meal coverage to Novolog 15 units TID (assuming patient consuming >50% of meals).  May need increase in basal insulin tomorrow after meal coverage increase.   Thanks, Christena Deem RN, MSN, BC-ADM Inpatient Diabetes Coordinator Team Pager [phone] (8a-5p)

## 2021-07-30 NOTE — Progress Notes (Signed)
RT note. Patient placed on bipap at this time 12/6 R8 30% sat 98% w/ stable  VS. Patient resting comfortable, RT will continue to monitor

## 2021-07-31 ENCOUNTER — Other Ambulatory Visit: Payer: Self-pay | Admitting: Home Health

## 2021-07-31 DIAGNOSIS — I5033 Acute on chronic diastolic (congestive) heart failure: Secondary | ICD-10-CM | POA: Diagnosis not present

## 2021-07-31 DIAGNOSIS — E1140 Type 2 diabetes mellitus with diabetic neuropathy, unspecified: Secondary | ICD-10-CM | POA: Diagnosis not present

## 2021-07-31 DIAGNOSIS — Z5189 Encounter for other specified aftercare: Secondary | ICD-10-CM

## 2021-07-31 DIAGNOSIS — Z794 Long term (current) use of insulin: Secondary | ICD-10-CM | POA: Diagnosis not present

## 2021-07-31 DIAGNOSIS — J189 Pneumonia, unspecified organism: Secondary | ICD-10-CM | POA: Diagnosis not present

## 2021-07-31 DIAGNOSIS — I25119 Atherosclerotic heart disease of native coronary artery with unspecified angina pectoris: Secondary | ICD-10-CM | POA: Diagnosis not present

## 2021-07-31 DIAGNOSIS — J9601 Acute respiratory failure with hypoxia: Secondary | ICD-10-CM | POA: Diagnosis not present

## 2021-07-31 LAB — BASIC METABOLIC PANEL
Anion gap: 11 (ref 5–15)
BUN: 14 mg/dL (ref 6–20)
CO2: 31 mmol/L (ref 22–32)
Calcium: 9.2 mg/dL (ref 8.9–10.3)
Chloride: 96 mmol/L — ABNORMAL LOW (ref 98–111)
Creatinine, Ser: 1 mg/dL (ref 0.44–1.00)
GFR, Estimated: >60 mL/min (ref >60)
Glucose, Bld: 189 mg/dL — ABNORMAL HIGH (ref 70–99)
Potassium: 3.6 mmol/L (ref 3.5–5.1)
Sodium: 138 mmol/L (ref 135–145)

## 2021-07-31 LAB — GLUCOSE, CAPILLARY
Glucose-Capillary: 194 mg/dL — ABNORMAL HIGH (ref 70–99)
Glucose-Capillary: 243 mg/dL — ABNORMAL HIGH (ref 70–99)

## 2021-07-31 LAB — MAGNESIUM: Magnesium: 1.8 mg/dL (ref 1.7–2.4)

## 2021-07-31 MED ORDER — METOPROLOL TARTRATE 50 MG PO TABS: 50.0000 mg | ORAL_TABLET | Freq: Two times a day (BID) | ORAL | 1 refills | Status: DC

## 2021-07-31 MED ORDER — HYDRALAZINE HCL 100 MG PO TABS: 100.0000 mg | ORAL_TABLET | Freq: Three times a day (TID) | ORAL | 1 refills | Status: DC

## 2021-07-31 MED ORDER — POTASSIUM CHLORIDE CRYS ER 10 MEQ PO TBCR: 10.0000 meq | EXTENDED_RELEASE_TABLET | Freq: Every day | ORAL | 0 refills | Status: DC

## 2021-07-31 MED ORDER — TORSEMIDE 20 MG PO TABS
40.0000 mg | ORAL_TABLET | Freq: Every day | ORAL | Status: DC
  Administered 2021-07-31: 40 mg via ORAL
  Filled 2021-07-31: qty 2

## 2021-07-31 MED ORDER — ISOSORBIDE MONONITRATE ER 60 MG PO TB24: 60.0000 mg | ORAL_TABLET | Freq: Every day | ORAL | 1 refills | Status: DC

## 2021-07-31 MED ORDER — TORSEMIDE 40 MG PO TABS: 40.0000 mg | ORAL_TABLET | Freq: Every day | ORAL | 1 refills | Status: DC

## 2021-07-31 NOTE — Discharge Summary (Signed)
Physician Discharge Summary  Lindsay [name]:[id number] DOB: 1981-01-11 DOA: 07/10/2021  PCP: [name], NP  Admit date: 07/10/2021 Discharge date: 07/31/2021  Admitted From: Home Disposition:  Home   Recommendations for Outpatient Follow-up:  Follow up with PCP in 1 weeks, appointment has been arranged with cardiology as an outpatient on 8/26, and BMP blood draw at cardiology office has been arranged on 8/19   Home Health:NO Equipment/Devices:none   Discharge Condition:Stable CODE STATUSFULL Diet recommendation: Heart Healthy / Carb Modified  Brief/Interim Summary:  Acute respiratory failure with hypoxia, POA Patient presenting with progressive shortness of breath, found to have a fever 101.9 with chest x-ray findings concerning for pneumonia versus pulmonary edema.  Etiology likely multifactorial given decompensated congestive heart failure, pneumonia, morbid obesity with OSA/OHS.  Patient initially requiring 8 L simple facemask on arrival which has now been titrated off at rest.  Repeat VBG during hospitalization with continued elevated PCO2 of 50.0. -She is currently on room air at time of discharge   Community-acquired pneumonia Patient presenting with progressive shortness of breath, fever with temperature 101.9 F on admission.  Requiring 8 L simple facemask to maintain SPO2.  Mildly elevated WBC.  Chest x-ray with findings of atypical infection.  Completed 7-day course of azithromycin and ceftriaxone.  Now oxygenating well on room air.   Acute on chronic diastolic congestive heart failure Patient presenting with elevated BNP, lower extreme edema, shortness of breath.  History of systolic congestive heart failure that has now recovered. TTE 7/23: LVEF 65-70%, mild LVH, IVC normal --Cardiology following, appreciate assistance -- Diuresis per cardiology, she was on large dose IV diuresis, 80 mg IV 3 times daily, she diuresed very well, she is -40 L during hospital  stay, her dry weight is 418, her weight dropped from 458 >> 415 at time of discharge.   -She will be started on torsemide 40 mg oral daily on discharge, and will put on potassium supplement K-Dur 10 M EQ oral daily, to have repeat labs done by cardiology on the 8/19 , and follow-up with primary cardiologist on 8/26.  CAD s/p PCI --Aspirin 81 mg p.o. daily --Atorvastatin 40 mg p.o. daily   Acute renal failure: Resolved Creatinine admission 0.88, worsened to 1.4 following aggressive IV fluid hydration in the ED.  Now improved with IV diuresis.   Urinary retention: Resolved During hospitalization, patient required Foley catheterization which was discontinued on 07/24/2021.   Type 2 diabetes mellitus Hemoglobin A1c 8.3, poorly controlled.   Resume home medications on discharge   Essential hypertension: -medications has been adjusted, she will be discharged on amlodipine 10 mg oral daily, metoprolol 50 mg oral twice daily, hydralazine 100 mg oral 3 times daily and Imdur 60 mg oral daily   Anemia of chronic disease Patient admitted with hemoglobin 9.7, stable.  Last year hemoglobin 13.  Panel receipt revealed low iron levels.  Patient refused iron supplementation while in the hospital.   Hyponatremia: Resolved with diuresis.  Sodium 138 this morning.   Diabetic peripheral neuropathy:  --Gabapentin 600 mg p.o. 3 times daily --Cymbalta 30 mg p.o. every morning   Obstructive sleep apnea Obesity hypoventilation syndrome Patient with continued elevated CO2 on VBG's during the hospitalization.  Likely secondary to underlying OSA and OHS from severe morbid obesity. -- Reports she has CPAP at home, and she is compliant with it.   Morbid obesity Body mass index is 65.7 kg/m. Discussed with patient needs for aggressive lifestyle changes/weight loss as this complicates all facets  of care.  Outpatient follow-up with PCP.  May benefit from bariatric evaluation outpatient.    Discharge Diagnoses:   Principal Problem:   CAP (community acquired pneumonia) Active Problems:   Essential hypertension   DM neuropathy, type II diabetes mellitus ([place])   Coronary artery disease involving native coronary artery of native heart with angina pectoris (HCC)   OSA (obstructive sleep apnea)   Hyperlipidemia   Normocytic anemia   Acute respiratory failure with hypoxia (HCC)   Acute on chronic diastolic heart failure [facility])    Discharge Instructions  Discharge Instructions     Diet - low sodium heart healthy   Complete by: As directed    Discharge instructions   Complete by: As directed    Follow with Primary MD [name], NP in 7 days   Get CBC, CMP,  checked  by Primary MD next visit.    Activity: As tolerated with Full fall precautions use walker/cane & assistance as needed   Disposition Home    Diet: Heart Healthy / low salt /carb modified , with feeding assistance and aspiration precautions.  For Heart failure patients - Check your Weight same time everyday, if you gain over 2 pounds, or you develop in leg swelling, experience more shortness of breath or chest pain, call your Primary MD immediately. Follow Cardiac Low Salt Diet and 1.5 lit/day fluid restriction.   On your next visit with your primary care physician please Get Medicines reviewed and adjusted.   Please request your Prim.MD to go over all Hospital Tests and Procedure/Radiological results at the follow up, please get all Hospital records sent to your Prim MD by signing hospital release before you go home.   If you experience worsening of your admission symptoms, develop shortness of breath, life threatening emergency, suicidal or homicidal thoughts you must seek medical attention immediately by calling 911 or calling your MD immediately  if symptoms less severe.  You Must read complete instructions/literature along with all the possible adverse reactions/side effects for all the Medicines you take and  that have been prescribed to you. Take any new Medicines after you have completely understood and accpet all the possible adverse reactions/side effects.   Do not drive, operating heavy machinery, perform activities at heights, swimming or participation in water activities or provide baby sitting services if your were admitted for syncope or siezures until you have seen by Primary MD or a Neurologist and advised to do so again.  Do not drive when taking Pain medications.    Do not take more than prescribed Pain, Sleep and Anxiety Medications  Special Instructions: If you have smoked or chewed Tobacco  in the last 2 yrs please stop smoking, stop any regular Alcohol  and or any Recreational drug use.  Wear Seat belts while driving.   Please note  You were cared for by a hospitalist during your hospital stay. If you have any questions about your discharge medications or the care you received while you were in the hospital after you are discharged, you can call the unit and asked to speak with the hospitalist on call if the hospitalist that took care of you is not available. Once you are discharged, your primary care physician will handle any further medical issues. Please note that NO REFILLS for any discharge medications will be authorized once you are discharged, as it is imperative that you return to your primary care physician (or establish a relationship with a primary care physician if you do  not have one) for your aftercare needs so that they can reassess your need for medications and monitor your lab values.   Increase activity slowly   Complete by: As directed    No wound care   Complete by: As directed       Allergies as of 07/31/2021       Reactions   Hydrazine Yellow [tartrazine] Shortness Of Breath, Swelling   Swelling mostly noticed in legs and feet, retaining urination, shortness of breaht, and minor chest pain   Lisinopril Shortness Of Breath   Was on prinzide; had sob/chest  pain on it.   Tylenol [acetaminophen] Itching, Swelling   Itching of the mouth, swelling of tongue and stomach started hurting        Medication List     STOP taking these medications    amoxicillin 500 MG capsule Commonly known as: AMOXIL   aspirin-acetaminophen-caffeine 250-250-65 MG tablet Commonly known as: EXCEDRIN MIGRAINE   celecoxib 100 MG capsule Commonly known as: CeleBREX   diphenhydrAMINE 25 MG tablet Commonly known as: SOMINEX   hydrochlorothiazide 25 MG tablet Commonly known as: HYDRODIURIL   ibuprofen 200 MG tablet Commonly known as: ADVIL   losartan 100 MG tablet Commonly known as: COZAAR   metroNIDAZOLE 500 MG tablet Commonly known as: FLAGYL       TAKE these medications    Accu-Chek Aviva Plus w/Device Kit 1 each by Does not apply route as directed.   Accu-Chek FastClix Lancets Misc USE 1 TO CHECK GLUCOSE 4 TIMES DAILY   amLODipine 10 MG tablet Commonly known as: NORVASC Take 1 tablet (10 mg total) by mouth daily.   aspirin 81 MG tablet Take 1 tablet (81 mg total) by mouth daily.   atorvastatin 40 MG tablet Commonly known as: LIPITOR Take 1 tablet (40 mg total) by mouth daily.   baclofen 10 MG tablet Commonly known as: LIORESAL Take 0.5-1 tablets (5-10 mg total) by mouth 3 (three) times daily as needed for muscle spasms.   BD Veo Insulin Syringe U/F 31G X 15/64" 1 ML Misc Generic drug: Insulin Syringe-Needle U-100 USE  SYRINGE ONCE DAILY   diphenhydrAMINE 25 MG tablet Commonly known as: BENADRYL Take 50 mg by mouth in the morning and at bedtime. Allergies   DULoxetine 30 MG capsule Commonly known as: CYMBALTA Take 1 capsule (30 mg total) by mouth daily.   Flovent HFA 110 MCG/ACT inhaler Generic drug: fluticasone Inhale 2 puffs into the lungs 2 (two) times daily.   gabapentin 300 MG capsule Commonly known as: NEURONTIN TAKE 2 CAPSULES BY MOUTH THREE TIMES DAILY   glucose blood test strip Commonly known as: Accu-Chek  Aviva Plus Use as instructed to test blood sugar 4 times daily.   HumuLIN R U-500 KwikPen 500 UNIT/ML KwikPen Generic drug: insulin regular human CONCENTRATED Inject 75-80 Units into the skin 3 (three) times daily with meals. AM 80 units  Noon 75 units  Evening 75 units If BG above 200 add 5 units to prescribed dose   hydrALAZINE 100 MG tablet Commonly known as: APRESOLINE Take 1 tablet (100 mg total) by mouth 3 (three) times daily.   Insulin Pen Needle 31G X 5 MM Misc Commonly known as: B-D UF III MINI PEN NEEDLES Use as instructed. Monitor blood glucose levels three times per day   BD Pen Needle Nano 2nd Gen 32G X 4 MM Misc Generic drug: Insulin Pen Needle   isosorbide mononitrate 60 MG 24 hr tablet Commonly known as: IMDUR Take  1 tablet (60 mg total) by mouth daily. Start taking on: August 01, 2021 What changed:  medication strength how much to take additional instructions   Melatonin 10 MG Tabs Take 3 tablets by mouth at bedtime.   metoprolol tartrate 50 MG tablet Commonly known as: LOPRESSOR Take 1 tablet (50 mg total) by mouth 2 (two) times daily. What changed:  medication strength how much to take additional instructions   MULTIVITAMIN PO Take 1 tablet by mouth daily.   nitroGLYCERIN 0.4 MG SL tablet Commonly known as: Nitrostat Place 1 tablet (0.4 mg total) under the tongue every 5 (five) minutes as needed for chest pain. Please keep upcoming appt in October 2022. Final Attempt   potassium chloride 10 MEQ tablet Commonly known as: KLOR-CON Take 1 tablet (10 mEq total) by mouth daily.   Torsemide 40 MG Tabs Take 40 mg by mouth daily.   Victoza 18 MG/3ML Sopn Generic drug: liraglutide Inject 1.8 mg into the skin at bedtime.       ASK your doctor about these medications    albuterol 108 (90 Base) MCG/ACT inhaler Commonly known as: VENTOLIN HFA INHALE 2 PUFFS BY MOUTH EVERY 6 HOURS AS NEEDED FOR WHEEZING FOR SHORTNESS OF BREATH   Jardiance 10 MG  Tabs tablet Generic drug: empagliflozin Take 10 mg by mouth daily.               Durable Medical Equipment  (From admission, onward)           Start     Ordered   07/21/21 1256  For home use only DME Shower stool  Once       Comments: Bariatric shower chair.   07/21/21 1256            Follow-up Information     [facility] Follow up.   Why: Following for NIV/Trilogy needs. Bariatric Shower Chair to be delivered to the home. Contact information: [place] [address] [phone]         [facility] Follow up.   Specialty: Home Health Services Why: Home Health PT- office to  call with a visit time. Contact information: [address] [place] [address] [phone]         [facility] Follow up on 08/28/2021.   Why: @ 9:30 am for hopsital follow up appointment with Juluis Mire NP. If you need to reschedule please call the office to do so. Contact information: [place] [address] [phone]        [name], MD Follow up on 08/15/2021.   Specialty: Cardiology Why: at 2:40PM for your post hospital follow up with cardiology Contact information: [address] [address] [phone]         [place] [facility] Follow up on 08/08/2021.   Specialty: Cardiology Why: please arrive between 8-4 for your blood work, no fasting needed Contact information: [address], [place] [address] [phone]               Allergies  Allergen Reactions   Hydrazine Yellow [Tartrazine] Shortness Of Breath and Swelling    Swelling mostly noticed in legs and feet, retaining urination, shortness of breaht, and minor chest pain   Lisinopril Shortness Of Breath    Was on prinzide; had sob/chest pain on it.   Tylenol [Acetaminophen] Itching and  Swelling  Itching of the mouth, swelling of tongue and stomach started hurting    Consultations: cardiology   Procedures/Studies: DG Chest 2 View  Result Date: 07/17/2021 CLINICAL DATA:  40 year old female with fever. EXAM: CHEST - 2 VIEW COMPARISON:  Chest radiograph dated 07/15/2021. FINDINGS: Faintly seen left-sided PICC with tip likely in the central SVC. The tip of the PICC appears curved up. Stable cardiomegaly. Diffuse interstitial and vascular prominence, likely edema. Overall similar or slightly progressed CHF compared to the prior radiograph. Superimposed pneumonia is not excluded. Clinical correlation is recommended. No acute osseous pathology. IMPRESSION: 1. Left-sided PICC with tip likely in the central SVC. 2. Cardiomegaly with findings of CHF similar or slightly progressed compared to the prior radiograph. Superimposed pneumonia is not excluded. Electronically Signed   By: Anner Crete M.D.   On: 07/17/2021 18:55   DG Chest Port 1 View  Result Date: 07/15/2021 CLINICAL DATA:  Shortness of breath. EXAM: PORTABLE CHEST 1 VIEW COMPARISON:  Chest x-ray 07/14/2021 FINDINGS: The heart is mildly enlarged but appears stable. Peribronchial thickening and increased interstitial markings could suggest mild interstitial edema or bronchitis. No pleural effusions or focal infiltrates. No pneumothorax. IMPRESSION: 1. Stable cardiac enlargement. 2. Peribronchial thickening and increased interstitial markings could suggest mild interstitial edema or bronchitis. Electronically Signed   By: Marijo Sanes M.D.   On: 07/15/2021 07:41   DG Chest Port 1 View  Result Date: 07/14/2021 CLINICAL DATA:  Shortness of breath EXAM: PORTABLE CHEST 1 VIEW COMPARISON:  07/12/2021 FINDINGS: Unchanged mild cardiomegaly. Interval worsening of pulmonary vascular congestion and interstitial pulmonary edema. IMPRESSION: Interval worsening of pulmonary vascular congestion and interstitial pulmonary edema.  Electronically Signed   By: Miachel Roux M.D.   On: 07/14/2021 07:43   DG Chest Port 1 View  Result Date: 07/12/2021 CLINICAL DATA:  Respiratory failure. EXAM: PORTABLE CHEST 1 VIEW COMPARISON:  07/11/2021 FINDINGS: The cardio pericardial silhouette is enlarged. There is pulmonary vascular congestion without overt pulmonary edema. Interstitial pulmonary edema pattern noted. No substantial pleural effusion. Telemetry leads overlie the chest. IMPRESSION: Enlarged cardiopericardial silhouette with pulmonary vascular congestion and interstitial pulmonary edema pattern. Stable to mildly improved in the interval. Electronically Signed   By: Misty Stanley M.D.   On: 07/12/2021 08:24   DG Chest Port 1 View  Result Date: 07/11/2021 CLINICAL DATA:  Short of breath. EXAM: PORTABLE CHEST 1 VIEW COMPARISON:  07/10/2021 FINDINGS: Cardiac enlargement. Progression of diffuse bilateral airspace disease with vascular congestion. Findings compatible with congestive heart failure and edema. No significant pleural effusion. IMPRESSION: Mild progression of pulmonary edema. Electronically Signed   By: Franchot Gallo M.D.   On: 07/11/2021 14:04   DG Chest Port 1 View  Result Date: 07/10/2021 CLINICAL DATA:  Dyspnea, fever EXAM: PORTABLE CHEST 1 VIEW COMPARISON:  04/20/2018 FINDINGS: Pulmonary insufflation is normal and symmetric. There has developed diffuse interstitial infiltrate within the lungs, more prevalent within the mid and lower lung zones, atypical infection versus edema. No pneumothorax or pleural effusion. Cardiac size is within normal limits. No acute bone abnormality., IMPRESSION: Interval development of diffuse interstitial infiltrate, atypical infection versus edema. Electronically Signed   By: Fidela Salisbury MD   On: 07/10/2021 22:14   ECHOCARDIOGRAM COMPLETE  Result Date: 07/12/2021    ECHOCARDIOGRAM REPORT   Patient Name:   CATALAYA GARR Date of Exam: 07/12/2021 Medical Rec #:  [id number]          Height:       67.0 in Accession #:  [id number]        Weight:       437.5 lb Date of Birth:  1981/04/13         BSA:          2.820 m Patient Age:    8 years          BP:           152/82 mmHg Patient Gender: F                 HR:           82 bpm. Exam Location:  Inpatient Procedure: 2D Echo, Cardiac Doppler, Color Doppler and Intracardiac            Opacification Agent Indications:    CHF-Acute Diastolic 150.56 / P79.48  History:        Patient has prior history of Echocardiogram examinations, most                 recent 09/08/2016. CAD and Previous Myocardial Infarction; Risk                 Factors:Diabetes, Dyslipidemia and Hypertension.  Sonographer:    Bernadene Person RDCS Referring Phys: Arnell Asal Margaree [facility]  Sonographer Comments: Patient is morbidly obese. IMPRESSIONS  1. Left ventricular ejection fraction, by estimation, is 65 to 70%. The left ventricle has normal function. Left ventricular endocardial border not optimally defined to evaluate regional wall motion. There is mild left ventricular hypertrophy. Left ventricular diastolic parameters were normal.  2. Right ventricular systolic function is normal. The right ventricular size is normal. There is normal pulmonary artery systolic pressure. The estimated right ventricular systolic pressure is 01.6 mmHg.  3. The mitral valve is grossly normal. Trivial mitral valve regurgitation.  4. The aortic valve is tricuspid. Aortic valve regurgitation is not visualized.  5. The inferior vena cava is normal in size with greater than 50% respiratory variability, suggesting right atrial pressure of 3 mmHg. FINDINGS  Left Ventricle: Left ventricular ejection fraction, by estimation, is 65 to 70%. The left ventricle has normal function. Left ventricular endocardial border not optimally defined to evaluate regional wall motion. Definity contrast agent was given IV to delineate the left ventricular endocardial borders. The left ventricular internal cavity size was  normal in size. There is mild left ventricular hypertrophy. Left ventricular diastolic parameters were normal. Right Ventricle: The right ventricular size is normal. No increase in right ventricular wall thickness. Right ventricular systolic function is normal. There is normal pulmonary artery systolic pressure. The tricuspid regurgitant velocity is 1.98 m/s, and  with an assumed right atrial pressure of 3 mmHg, the estimated right ventricular systolic pressure is 55.3 mmHg. Left Atrium: Left atrial size was normal in size. Right Atrium: Right atrial size was normal in size. Pericardium: There is no evidence of pericardial effusion. Mitral Valve: The mitral valve is grossly normal. Mild mitral annular calcification. Trivial mitral valve regurgitation. Tricuspid Valve: The tricuspid valve is grossly normal. Tricuspid valve regurgitation is trivial. Aortic Valve: The aortic valve is tricuspid. Aortic valve regurgitation is not visualized. Pulmonic Valve: The pulmonic valve was not well visualized. Pulmonic valve regurgitation is trivial. Aorta: The aortic root is normal in size and structure. Venous: The inferior vena cava is normal in size with greater than 50% respiratory variability, suggesting right atrial pressure of 3 mmHg. IAS/Shunts: No atrial level shunt detected by color flow Doppler.  LEFT VENTRICLE PLAX 2D LVIDd:  3.90 cm  Diastology LVIDs:         2.60 cm  LV e' medial:    8.03 cm/s LV PW:         1.20 cm  LV E/e' medial:  15.3 LV IVS:        1.10 cm  LV e' lateral:   8.51 cm/s LVOT diam:     2.00 cm  LV E/e' lateral: 14.5 LV SV:         55 LV SV Index:   19 LVOT Area:     3.14 cm  LEFT ATRIUM             Index       RIGHT ATRIUM           Index LA diam:        3.40 cm 1.21 cm/m  RA Area:     13.00 cm LA Vol (A2C):   56.1 ml 19.89 ml/m RA Volume:   29.40 ml  10.42 ml/m LA Vol (A4C):   62.6 ml 22.20 ml/m LA Biplane Vol: 61.3 ml 21.73 ml/m  AORTIC VALVE LVOT Vmax:   98.50 cm/s LVOT Vmean:   62.400 cm/s LVOT VTI:    0.175 m  AORTA Ao Root diam: 3.00 cm Ao Asc diam:  3.20 cm MITRAL VALVE                TRICUSPID VALVE MV Area (PHT): 3.72 cm     TR Peak grad:   15.7 mmHg MV Decel Time: 204 msec     TR Vmax:        198.00 cm/s MV E velocity: 123.00 cm/s MV A velocity: 61.80 cm/s   SHUNTS MV E/A ratio:  1.99         Systemic VTI:  0.18 m                             Systemic Diam: 2.00 cm Rozann Lesches MD Electronically signed by Rozann Lesches MD Signature Date/Time: 07/12/2021/12:22:10 PM    Final    VAS [place] LOWER EXTREMITY VENOUS (DVT)  Result Date: 07/11/2021  Lower Venous DVT Study Patient Name:  CHARLETTA VOIGHT  Date of Exam:   07/11/2021 Medical Rec #: [id number]          Accession #:    [id number] Date of Birth: 27-Apr-1981          Patient Gender: F Patient Age:   8Y Exam Location:  [facility] Procedure:      VAS [place] LOWER EXTREMITY VENOUS (DVT) Referring Phys: [facility] --------------------------------------------------------------------------------  Indications: SOB.  Limitations: Body habitus, poor ultrasound/tissue interface and depth of vessels, sitting upright on bipap, unable to tolerate some compression maneuvers. Comparison Study: No prior study Performing Technologist: Maudry Mayhew MHA, RDMS, RVT, RDCS  Examination Guidelines: A complete evaluation includes B-mode imaging, spectral Doppler, color Doppler, and power Doppler as needed of all accessible portions of each vessel. Bilateral testing is considered an integral part of a complete examination. Limited examinations for reoccurring indications may be performed as noted. The reflux portion of the exam is performed with the patient in reverse Trendelenburg.  +---------+---------------+---------+-----------+---------------+-------------+ RIGHT    CompressibilityPhasicitySpontaneityProperties     Thrombus  Aging          +---------+---------------+---------+-----------+---------------+-------------+ CFV      Full           Yes      Yes        patent                       +---------+---------------+---------+-----------+---------------+-------------+ SFJ                                         Unable to                                                                visualize                    +---------+---------------+---------+-----------+---------------+-------------+ FV Prox  Full                               patent                       +---------+---------------+---------+-----------+---------------+-------------+ FV Mid   Full                               patent                       +---------+---------------+---------+-----------+---------------+-------------+ FV Distal                                   Unable to                                                                visualize                    +---------+---------------+---------+-----------+---------------+-------------+ PFV      Full                               patent                       +---------+---------------+---------+-----------+---------------+-------------+ POP      Full           Yes      Yes        patent                       +---------+---------------+---------+-----------+---------------+-------------+ PTV      Full                               patent                       +---------+---------------+---------+-----------+---------------+-------------+  PERO     Full                               patent                       +---------+---------------+---------+-----------+---------------+-------------+   Right Technical Findings: Not visualized segments include FV distal.  +---------+---------------+---------+-----------+---------------+-------------+ LEFT     CompressibilityPhasicitySpontaneityProperties     Thrombus                                                                  Aging         +---------+---------------+---------+-----------+---------------+-------------+ CFV                     Yes      Yes        patent                       +---------+---------------+---------+-----------+---------------+-------------+ SFJ                                         Unable to                                                                visualize                    +---------+---------------+---------+-----------+---------------+-------------+ FV Prox                          Yes        patent                       +---------+---------------+---------+-----------+---------------+-------------+ FV Mid   Full                               patent                       +---------+---------------+---------+-----------+---------------+-------------+ FV Distal                                   Unable to                                                                visualize                    +---------+---------------+---------+-----------+---------------+-------------+ PFV      Full  Yes        patent                       +---------+---------------+---------+-----------+---------------+-------------+ POP      Full           Yes      Yes        patent                       +---------+---------------+---------+-----------+---------------+-------------+ PTV      Full                               patent                       +---------+---------------+---------+-----------+---------------+-------------+ PERO     Full                               patent                       +---------+---------------+---------+-----------+---------------+-------------+     Summary: RIGHT: - There is no evidence of deep vein thrombosis in the lower extremity. However, portions of this examination were limited- see technologist comments above.  - No cystic structure found in the popliteal  fossa.  LEFT: - There is no evidence of deep vein thrombosis in the lower extremity. However, portions of this examination were limited- see technologist comments above.  - No cystic structure found in the popliteal fossa.  *See table(s) above for measurements and observations. Electronically signed by Jamelle Haring on 07/11/2021 at 4:44:13 PM.    Final    VAS [place] UPPER EXTREMITY VENOUS DUPLEX  Result Date: 07/18/2021 UPPER VENOUS STUDY  Patient Name:  KEEVA REISEN  Date of Exam:   07/18/2021 Medical Rec #: [id number]          Accession #:    [id number] Date of Birth: 12-07-1981          Patient Gender: F Patient Age:   35Y Exam Location:  [facility] Procedure:      VAS [place] UPPER EXTREMITY VENOUS DUPLEX Referring Phys: [id number] VIJAYA AKULA --------------------------------------------------------------------------------  Indications: Swelling Risk Factors: Surgery PICC line placement. Limitations: Body habitus and poor ultrasound/tissue interface. Comparison Study: No prior studies. Performing Technologist: Oliver Hum RVT  Examination Guidelines: A complete evaluation includes B-mode imaging, spectral Doppler, color Doppler, and power Doppler as needed of all accessible portions of each vessel. Bilateral testing is considered an integral part of a complete examination. Limited examinations for reoccurring indications may be performed as noted.  Right Findings: +----------+------------+---------+-----------+----------+-------+ RIGHT     CompressiblePhasicitySpontaneousPropertiesSummary +----------+------------+---------+-----------+----------+-------+ Subclavian    Full       Yes       Yes                      +----------+------------+---------+-----------+----------+-------+  Left Findings: +----------+------------+---------+-----------+----------+-------+ LEFT      CompressiblePhasicitySpontaneousPropertiesSummary +----------+------------+---------+-----------+----------+-------+  IJV           Full       Yes       Yes                      +----------+------------+---------+-----------+----------+-------+ Subclavian    Full       Yes  Yes                      +----------+------------+---------+-----------+----------+-------+ Axillary      Full       Yes       Yes                      +----------+------------+---------+-----------+----------+-------+ Brachial      Full       Yes       Yes                      +----------+------------+---------+-----------+----------+-------+ Radial        Full                                          +----------+------------+---------+-----------+----------+-------+ Ulnar         Full                                          +----------+------------+---------+-----------+----------+-------+ Cephalic      Full                                          +----------+------------+---------+-----------+----------+-------+ Basilic       Full                                          +----------+------------+---------+-----------+----------+-------+  Summary:  Right: No evidence of thrombosis in the subclavian.  Left: No evidence of deep vein thrombosis in the upper extremity. No evidence of superficial vein thrombosis in the upper extremity.  *See table(s) above for measurements and observations.  Diagnosing physician: Deitra Mayo MD Electronically signed by Deitra Mayo MD on 07/18/2021 at 5:32:18 PM.    Final    [place] EKG SITE RITE  Result Date: 07/16/2021 If Site Rite image not attached, placement could not be confirmed due to current cardiac rhythm.     Subjective:  Ports she is feeling better today, no dyspnea, no chest pain Discharge Exam: Vitals:   07/31/21 0724 07/31/21 0806  BP:  137/72  Pulse:  82  Resp:  19  Temp:    SpO2: 94% 96%   Vitals:   07/31/21 0614 07/31/21 0722 07/31/21 0724 07/31/21 0806  BP:    137/72  Pulse:    82  Resp:    19  Temp:      TempSrc:       SpO2:  94% 94% 96%  Weight: (!) 188.5 kg     Height:       Awake Alert, Oriented X 3, No new F.N deficits, Normal affect Symmetrical Chest wall movement, Good air movement bilaterally, CTAB RRR,No Gallops,Rubs or new Murmurs, No Parasternal Heave +ve B.Sounds, Abd Soft, No tenderness, No rebound - guarding or rigidity. No Cyanosis, Clubbing , +1 edema bilaterally , No new Rash or bruise       The results of significant diagnostics from this hospitalization (including imaging, microbiology, ancillary and laboratory) are listed below for reference.     Microbiology: No results found for this  or any previous visit (from the past 240 hour(s)).   Labs: BNP (last 3 results) Recent Labs    07/15/21 0029 07/16/21 0114 07/17/21 0610  BNP 148.1* 179.8* 889.1*   Basic Metabolic Panel: Recent Labs  Lab 07/27/21 0650 07/28/21 0500 07/29/21 0525 07/30/21 0500 07/31/21 0651  NA 140 139 136 137 138  K 3.6 3.8 3.6 3.6 3.6  CL 100 97* 96* 96* 96*  CO2 34* 35* 34* 34* 31  GLUCOSE 117* 167* 237* 236* 189*  BUN _0 CREATININE 1.01* 1.11* 1.02* 0.99 1.00  CALCIUM 8.1* 8.7* 8.9 9.0 9.2  MG  --   --  1.8 1.8 1.8   Liver Function Tests: No results for input(s): AST, ALT, ALKPHOS, BILITOT, PROT, ALBUMIN in the last 168 hours. No results for input(s): LIPASE, AMYLASE in the last 168 hours. No results for input(s): AMMONIA in the last 168 hours. CBC: Recent Labs  Lab 07/30/21 0500  WBC 4.0  HGB 8.7*  HCT 28.8*  MCV 85.5  PLT 259   Cardiac Enzymes: No results for input(s): CKTOTAL, CKMB, CKMBINDEX, TROPONINI in the last 168 hours. BNP: Invalid input(s): POCBNP CBG: Recent Labs  Lab 07/30/21 0755 07/30/21 1209 07/30/21 1558 07/30/21 2105 07/31/21 0735  GLUCAP 255* 256* 217* 254* 194*   D-Dimer No results for input(s): DDIMER in the last 72 hours. Hgb A1c No results for input(s): HGBA1C in the last 72 hours. Lipid Profile No results for input(s): CHOL,  HDL, LDLCALC, TRIG, CHOLHDL, LDLDIRECT in the last 72 hours. Thyroid function studies No results for input(s): TSH, T4TOTAL, T3FREE, THYROIDAB in the last 72 hours.  Invalid input(s): FREET3 Anemia work up No results for input(s): VITAMINB12, FOLATE, FERRITIN, TIBC, IRON, RETICCTPCT in the last 72 hours. Urinalysis    Component Value Date/Time   COLORURINE YELLOW 07/11/2021 0128   APPEARANCEUR CLEAR 07/11/2021 0128   LABSPEC 1.011 07/11/2021 0128   PHURINE 6.0 07/11/2021 0128   GLUCOSEU >=500 (A) 07/11/2021 0128   GLUCOSEU >=1000 (A) 02/18/2017 1018   HGBUR SMALL (A) 07/11/2021 0128   BILIRUBINUR NEGATIVE 07/11/2021 0128   BILIRUBINUR negative 06/10/2016 1250   KETONESUR NEGATIVE 07/11/2021 0128   PROTEINUR 100 (A) 07/11/2021 0128   UROBILINOGEN 0.2 02/18/2017 1018   NITRITE NEGATIVE 07/11/2021 0128   LEUKOCYTESUR NEGATIVE 07/11/2021 0128   Sepsis Labs Invalid input(s): PROCALCITONIN,  WBC,  LACTICIDVEN Microbiology No results found for this or any previous visit (from the past 240 hour(s)).   Time coordinating discharge: Over 30 minutes  SIGNED:   Phillips Climes, MD  [facility] 07/31/2021, 10:47 AM Pager   If 7PM-7AM, please contact night-coverage [email or web address] Password [email or web address]

## 2021-07-31 NOTE — Discharge Instructions (Signed)
Follow with Primary MD Grayce Sessions, NP in 7 days   Get CBC, CMP,  checked  by Primary MD next visit.    Activity: As tolerated with Full fall precautions use walker/cane & assistance as needed   Disposition Home    Diet: Heart Healthy / low salt , with feeding assistance and aspiration precautions.  For Heart failure patients - Check your Weight same time everyday, if you gain over 2 pounds, or you develop in leg swelling, experience more shortness of breath or chest pain, call your Primary MD immediately. Follow Cardiac Low Salt Diet and 1.5 lit/day fluid restriction.   On your next visit with your primary care physician please Get Medicines reviewed and adjusted.   Please request your Prim.MD to go over all Hospital Tests and Procedure/Radiological results at the follow up, please get all Hospital records sent to your Prim MD by signing hospital release before you go home.   If you experience worsening of your admission symptoms, develop shortness of breath, life threatening emergency, suicidal or homicidal thoughts you must seek medical attention immediately by calling 911 or calling your MD immediately  if symptoms less severe.  You Must read complete instructions/literature along with all the possible adverse reactions/side effects for all the Medicines you take and that have been prescribed to you. Take any new Medicines after you have completely understood and accpet all the possible adverse reactions/side effects.   Do not drive, operating heavy machinery, perform activities at heights, swimming or participation in water activities or provide baby sitting services if your were admitted for syncope or siezures until you have seen by Primary MD or a Neurologist and advised to do so again.  Do not drive when taking Pain medications.    Do not take more than prescribed Pain, Sleep and Anxiety Medications  Special Instructions: If you have smoked or chewed Tobacco  in the  last 2 yrs please stop smoking, stop any regular Alcohol  and or any Recreational drug use.  Wear Seat belts while driving.   Please note  You were cared for by a hospitalist during your hospital stay. If you have any questions about your discharge medications or the care you received while you were in the hospital after you are discharged, you can call the unit and asked to speak with the hospitalist on call if the hospitalist that took care of you is not available. Once you are discharged, your primary care physician will handle any further medical issues. Please note that NO REFILLS for any discharge medications will be authorized once you are discharged, as it is imperative that you return to your primary care physician (or establish a relationship with a primary care physician if you do not have one) for your aftercare needs so that they can reassess your need for medications and monitor your lab values.

## 2021-07-31 NOTE — Progress Notes (Signed)
BMP repeat in 1 week ordered for 08/08/21, follow up results with Dr Katrinka Blazing in the office on 08/15/21.

## 2021-07-31 NOTE — Progress Notes (Addendum)
Progress Note  Patient Name: Lindsay [name] Date of Encounter: 07/31/2021  [facility] Cardiologist: Lesleigh Noe, MD   Subjective   Patient states she is urinating too frequently from the lasix. She is doing well, denied any SOB or chest pain.    Inpatient Medications    Scheduled Meds:  amLODipine  10 mg Oral Daily   aspirin  81 mg Oral Daily   atorvastatin  40 mg Oral Daily   budesonide (PULMICORT) nebulizer solution  0.25 mg Nebulization BID   Chlorhexidine Gluconate Cloth  6 each Topical Daily   DULoxetine  30 mg Oral q AM   empagliflozin  10 mg Oral Daily   enoxaparin (LOVENOX) injection  100 mg Subcutaneous Q24H   gabapentin  600 mg Oral TID   hydrALAZINE  100 mg Oral Q8H   insulin aspart  0-5 Units Subcutaneous QHS   insulin aspart  0-9 Units Subcutaneous TID WC   insulin aspart  14 Units Subcutaneous TID WC   insulin glargine-yfgn  40 Units Subcutaneous QHS   ipratropium-albuterol  3 mL Nebulization BID   isosorbide mononitrate  60 mg Oral Daily   metoprolol tartrate  50 mg Oral BID   nystatin   Topical BID   sodium chloride flush  10-40 mL Intracatheter Q12H   tamsulosin  0.4 mg Oral Daily   torsemide  40 mg Oral Daily   Continuous Infusions:  sodium chloride 10 mL/hr at 07/16/21 2153   PRN Meds: sodium chloride, albuterol, aspirin-acetaminophen-caffeine, baclofen, morphine injection, nitroGLYCERIN, [DISCONTINUED] ondansetron **OR** ondansetron (ZOFRAN) IV, polyethylene glycol, sodium chloride flush   Vital Signs    Vitals:   07/31/21 0614 07/31/21 0722 07/31/21 0724 07/31/21 0806  BP:    137/72  Pulse:    82  Resp:    19  Temp:      TempSrc:      SpO2:  94% 94% 96%  Weight: (!) 188.5 kg     Height:        Intake/Output Summary (Last 24 hours) at 07/31/2021 0827 Last data filed at 07/30/2021 2156 Gross per 24 hour  Intake --  Output 1502 ml  Net -1502 ml   Last 3 Weights 07/31/2021 07/30/2021 07/29/2021  Weight (lbs) 415 lb 8 oz 419 lb  8 oz 424 lb 1.6 oz  Weight (kg) 188.47 kg 190.284 kg 192.37 kg      Telemetry    Sinus rhythm 70s-80, artifacts, occasional PVCs. - Personally Reviewed  ECG    No new tracings this AM- Personally Reviewed  Physical Exam   GEN: no acute distress, sitting in commode, morbidly obese  Neck: difficult to appreciate JVD due to short and thick neck  Cardiac: RRR, no murmurs, rubs, or gallops.  Respiratory: distant but clear lungs sounds due to body habitus, on room air, speaks full sentence  GI: Soft, obese, nontender, non-distended  MS: no BLE edema; No deformity. Neuro:  Nonfocal  Psych: Normal affect   Labs    High Sensitivity Troponin:   Recent Labs  Lab 07/10/21 2300 07/11/21 0614 07/11/21 1656 07/11/21 2111 07/12/21 1723  TROPONINIHS 18* 13 9 9 9       Chemistry Recent Labs  Lab 07/29/21 0525 07/30/21 0500 07/31/21 0651  NA 136 137 138  K 3.6 3.6 3.6  CL 96* 96* 96*  CO2 34* 34* 31  GLUCOSE 237* 236* 189*  BUN 17 15 14   CREATININE 1.02* 0.99 1.00  CALCIUM 8.9 9.0 9.2  GFRNONAA >  60 >60 >60  ANIONGAP 6 7 11      Hematology Recent Labs  Lab 07/30/21 0500  WBC 4.0  RBC 3.37*  HGB 8.7*  HCT 28.8*  MCV 85.5  MCH 25.8*  MCHC 30.2  RDW 14.8  PLT 259    BNP No results for input(s): BNP, PROBNP in the last 168 hours.    DDimer No results for input(s): DDIMER in the last 168 hours.   Radiology    No results found.  Cardiac Studies   Echo from 07/12/21:    1. Left ventricular ejection fraction, by estimation, is 65 to 70%. The  left ventricle has normal function. Left ventricular endocardial border  not optimally defined to evaluate regional wall motion. There is mild left  ventricular hypertrophy. Left ventricular diastolic parameters were normal.   2. Right ventricular systolic function is normal. The right ventricular  size is normal. There is normal pulmonary artery systolic pressure. The  estimated right ventricular systolic pressure is  18.7 mmHg.   3. The mitral valve is grossly normal. Trivial mitral valve  regurgitation.   4. The aortic valve is tricuspid. Aortic valve regurgitation is not  visualized.   5. The inferior vena cava is normal in size with greater than 50%  respiratory variability, suggesting right atrial pressure of 3 mmHg.   LHC on 04/25/2020:   LAD stent is widely patent with perhaps eccentric 20% narrowing in some views.  The jailed second diagonal is widely patent.  Proximal first diagonal contains ostial to proximal segmental 60% narrowing Normal left main Moderate irregularities in the circumflex up to 40% in the first marginal. Dominant right coronary, luminal irregularities. Normal LV function with EF 60%.  Normal LVEDP.   RECOMMENDATIONS:   Continue aggressive risk factor modification including greater than 150 minutes of moderate activity 5 out of 7 days of the week, weight loss, LDL less than 70, blood pressure control, and management of potential sleep apnea. Chest pain and dyspnea do not appear to be related to obstructive disease or heart failure respectively.  Other explanations should be sought.   Patient Profile     40 y.o. female with PMH of CAD (NSTEMI in 9/ 2017 s/p DES to LAD and POBA of D2, diastolic heart failure, ischemic cardiomyopathy with recovered EF,  asthma, insulin requiring type II DM, hyperlipidemia, hypertension, OSA, morbid obesity, cardiology is following for decompensated CHF.   Assessment & Plan    Dizziness, improved  - negative for orthostatic hypotension 8/9  Acute hypoxic respiratory failure, resolved  - presented with 24 hours onset of SOB, cough, fever, chills and 07/10/2021, found to be tachypneic with borderline hypoxic 90%, hypertensive 159/71, febrile 101.1 at admission.  - CXR with interval development of diffuse interstitial infiltrate representing atypical infection versus edema, POA.  - multifactorial from CHF decompensation, CAP,  underlying OSA,  OHS.  - Weaned to RA now, completed a course of IV antibiotics for CAP. CHF management as below   Acute diastolic heart failure - BNP elevated to 190 on 07/17/21.  - Echo from 07/12/2021 revealed EF 65 to 70%, difficult assess regional wall motion, normal PASP, trivial MR.  - She has been diuresing with IV lasix , Net -39.9 L this admission, UOP 1.5 L over the past 24 hours, Weight 458 lbs >415 ibs since admission, dry weight is near 415 ibs. Volume status difficult to determine due to body habitus, appears improving overall  - Continue monitor strict intake and output, daily weight, daily  electrolytes - GDMT: Continue metoprolol 50 mg BID,  Imdur 60mg , hydralazine 100mg  TID, Jardiance 10 mg daily - CHF education provided at bedside  - Will transition IV lasix 80mg  TID  PO torsemide 40 mg daily today, OK to discharge today - Follow up with cardiology arranged on 08/15/21 , repeat BMP on 08/08/21 before follow up appt, script given     CAD s/p PCI/DES to LAD 2017 - no acute issue - continue medical therapy with aspirin 81 mg, lipitor 40 mg, metoprolol 50 mg BID, Imdur 60 mg daily, amlodipine 10 mg daily   Hypertension - BP stable/controlled  - Continue amlodipine 10 mg daily, metoprolol 50 mg BID, Imdur 60 mg daily, hydralazine 100 mg 3 times daily; suspect we will transition back to losartan as outpatient if no CP free (to decrease pill burden)  Hyperlipidemia - LDL 68 ton 07/23/21  - Continue Lipitor 40 mg daily  Type 2 DM - A1C 8.3% from 07/11/21  - managed per IM       For questions or updates, please contact [facility] Please consult [email or web address] for contact info under        Signed, 2018, NP  07/31/2021, 8:27 AM        Personally seen and examined. Agree with APP above with the following comments and with changes above: Briefly patient is at her dry weight after prolonged hospitalization - transitioning to PO torsemide today - with present BP regimen kidney and  BMP have done well; based on BP and outpatient BMP losartan may be resumed - patient knows to check daily weights and call if increasing; may need increased PO torsemide  [facility] will sign off.   Medication Recommendations:  diuretics and BP regimen as above Other recommendations (labs, testing, etc):  08/08/21 BMP Follow up as an outpatient:  08/15/21  09/30/2021, MD Cardiologist [facility]  [address] [place], [address] [place], [address] [place] [phone]  11:24 AM

## 2021-07-31 NOTE — Plan of Care (Signed)

## 2021-07-31 NOTE — TOC Progression Note (Signed)
Transition of Care [facility]) - Progression Note    Patient Details  Name: Lindsay [name] MRN: [id number] Date of Birth: 03-Aug-1981  [facility] CM/SW Contact  Lockie Pares, RN Phone Number: 07/31/2021, 12:56 PM  Clinical Narrative:      Update on medical equipment. Shower chair is being shipped to her hoome, They are still awaiting authorization on the trilogy.  Group cjhat with RN, MD and case Manager revealed that the patient may go home as long as she has CPAP, to await bipap being sent to home. Informing [employer] of this.   Expected Discharge Plan: Home w Home Health Services Barriers to Discharge: Continued Medical Work up  Expected Discharge Plan and Services Expected Discharge Plan: Home w Home Health Services In-house Referral: NA Discharge Planning Services: CM Consult Post Acute Care Choice: Durable Medical Equipment, Home Health Living arrangements for the past 2 months: Apartment Expected Discharge Date: 07/31/21               DME Arranged: Shower stool DME Agency: [employer] Date DME Agency Contacted: 07/21/21 Time DME Agency Contacted: 1309 Representative spoke with at DME Agency: Velna Hatchet HH Arranged: PT HH Agency: [facility] Date HH Agency Contacted: 07/21/21 Time HH Agency Contacted: 1300 Representative spoke with at [facility] Agency: Alcario Drought   Social Determinants of Health (SDOH) Interventions    Readmission Risk Interventions No flowsheet data found.

## 2021-08-01 ENCOUNTER — Telehealth: Payer: Self-pay

## 2021-08-01 ENCOUNTER — Telehealth (INDEPENDENT_AMBULATORY_CARE_PROVIDER_SITE_OTHER): Payer: Self-pay

## 2021-08-01 ENCOUNTER — Telehealth: Payer: Self-pay | Admitting: Interventional Cardiology

## 2021-08-01 ENCOUNTER — Ambulatory Visit: Payer: Medicaid Other | Admitting: Podiatry

## 2021-08-01 MED ORDER — TORSEMIDE 20 MG PO TABS: 40.0000 mg | ORAL_TABLET | Freq: Every day | ORAL | 3 refills | Status: DC

## 2021-08-01 NOTE — Telephone Encounter (Signed)
Called pt back in regards to cost of Torsemide.  I asked pt if she has tried to use a good rx coupon.  She replied no.  She then explained that pharmacist told her that insurance may cover torsemide 20 mg tablets.  I will switch order to torsemide 20 mg tablets PO take 2 daily.  Pt expressed that she will use good rx coupon if 20 mg tablets do not help with cost.

## 2021-08-01 NOTE — Telephone Encounter (Signed)
Contacted patient and made her aware that medication had already been sent to her pharmacy by another provider. Maryjean Morn, CMA    Copied from CRM [id number]. Topic: General - Other >> Aug 01, 2021  8:49 AM Jaquita Rector A wrote: Reason for CRM: Patient called in to inquire if Gwinda Passe could please send in an Rx for torsemide 20 MG TABS say that her insurance may cover and pay say that she was discharged from hospital on 07/31/21 and have not had this medication since then. Patient will also need a hospital follow up appointment but first thing in the system is 09/18/21 please call patient at   Ph# [phone]

## 2021-08-01 NOTE — Telephone Encounter (Signed)
Pt c/o medication issue:  1. Name of Medication:  Torsemide   2. How are you currently taking this medication (dosage and times per day)?   3. Are you having a reaction (difficulty breathing--STAT)? No  4. What is your medication issue?  Patient says her insurance will not pay for it, she needs to have something else prescribed

## 2021-08-01 NOTE — Telephone Encounter (Addendum)
Transition Care Management Follow-up Telephone Call Date of discharge and from where: 07/31/2021-[place] How have you been since you were released from the hospital? Patient stated she is doing fine.  Any questions or concerns? No  Items Reviewed: Did the pt receive and understand the discharge instructions provided? Yes  Medications obtained and verified? Yes  Other? No  Any new allergies since your discharge? No  Dietary orders reviewed? N/A Do you have support at home? Yes   Home Care and Equipment/Supplies: Were home health services ordered? not applicable If so, what is the name of the agency? N/A  Has the agency set up a time to come to the patient's home? not applicable Were any new equipment or medical supplies ordered?  No What is the name of the medical supply agency? N/A Were you able to get the supplies/equipment? not applicable Do you have any questions related to the use of the equipment or supplies? No  Functional Questionnaire: (I = Independent and D = Dependent) ADLs: I  Bathing/Dressing- I  Meal Prep- I  Eating- I  Maintaining continence- I  Transferring/Ambulation- I  Managing Meds- I  Follow up appointments reviewed:  PCP Hospital f/u appt confirmed? Yes  Scheduled to see Elwanda Brooklyn on 08/28/2021 @ 9:30 AM. Specialist Hospital f/u appt confirmed? Yes  Scheduled to see Dr. Katrinka Blazing on 08/15/2021 @ 2:40 pm. Are transportation arrangements needed? No  If their condition worsens, is the pt aware to call PCP or go to the Emergency Dept.? Yes Was the patient provided with contact information for the PCP's office or ED? Yes Was to pt encouraged to call back with questions or concerns? Yes

## 2021-08-01 NOTE — Telephone Encounter (Signed)
Transition Care Management Follow-up Telephone Call Date of discharge and from where: 07/31/2021-[place] How have you been since you were released from the hospital? Patient stated is good.  Any questions or concerns? No   Items Reviewed: Did the pt receive and understand the discharge instructions provided? Yes  Medications obtained and verified? Yes  Other? No  Any new allergies since your discharge? No  Dietary orders reviewed? N/A Do you have support at home? Yes    Home Care and Equipment/Supplies: NA    Functional Questionnaire: (I = Independent and D = Dependent) ADLs: I       Follow up appointments reviewed:   PCP Hospital f/u appt confirmed? Yes  Scheduled to see Elwanda Brooklyn on 08/28/2021 @ 9:30 AM. Specialist Hospital f/u appt confirmed? Yes  Scheduled to see Dr. Katrinka Blazing on 08/15/2021 @ 2:40 pm. Are transportation arrangements needed? No  If their condition worsens, is the pt aware to call PCP or go to the Emergency Dept.? Yes Was the patient provided with contact information for the PCP's office or ED? Yes Was to pt encouraged to call back with questions or concerns? Yes

## 2021-08-04 ENCOUNTER — Other Ambulatory Visit: Payer: Self-pay | Admitting: Family Medicine

## 2021-08-04 MED ORDER — BACLOFEN 10 MG PO TABS: 5.0000 mg | ORAL_TABLET | Freq: Three times a day (TID) | ORAL | 3 refills | Status: DC | PRN

## 2021-08-08 ENCOUNTER — Other Ambulatory Visit: Payer: Self-pay

## 2021-08-08 ENCOUNTER — Other Ambulatory Visit: Payer: Medicaid Other | Admitting: *Deleted

## 2021-08-08 DIAGNOSIS — Z5189 Encounter for other specified aftercare: Secondary | ICD-10-CM

## 2021-08-08 DIAGNOSIS — I5033 Acute on chronic diastolic (congestive) heart failure: Secondary | ICD-10-CM | POA: Diagnosis not present

## 2021-08-08 DIAGNOSIS — J9601 Acute respiratory failure with hypoxia: Secondary | ICD-10-CM | POA: Diagnosis not present

## 2021-08-08 DIAGNOSIS — E662 Morbid (severe) obesity with alveolar hypoventilation: Secondary | ICD-10-CM | POA: Diagnosis not present

## 2021-08-08 LAB — BASIC METABOLIC PANEL
BUN/Creatinine Ratio: 17 (ref 9–23)
BUN: 25 mg/dL — ABNORMAL HIGH (ref 6–20)
CO2: 23 mmol/L (ref 20–29)
Calcium: 8.8 mg/dL (ref 8.7–10.2)
Chloride: 100 mmol/L (ref 96–106)
Creatinine, Ser: 1.44 mg/dL — ABNORMAL HIGH (ref 0.57–1.00)
Glucose: 101 mg/dL — ABNORMAL HIGH (ref 65–99)
Potassium: 4.3 mmol/L (ref 3.5–5.2)
Sodium: 141 mmol/L (ref 134–144)
eGFR: 47 mL/min/{1.73_m2} — ABNORMAL LOW (ref >59)

## 2021-08-11 DIAGNOSIS — G4733 Obstructive sleep apnea (adult) (pediatric): Secondary | ICD-10-CM | POA: Diagnosis not present

## 2021-08-11 DIAGNOSIS — J9601 Acute respiratory failure with hypoxia: Secondary | ICD-10-CM | POA: Diagnosis not present

## 2021-08-11 DIAGNOSIS — I5033 Acute on chronic diastolic (congestive) heart failure: Secondary | ICD-10-CM | POA: Diagnosis not present

## 2021-08-11 DIAGNOSIS — R269 Unspecified abnormalities of gait and mobility: Secondary | ICD-10-CM | POA: Diagnosis not present

## 2021-08-11 DIAGNOSIS — E1140 Type 2 diabetes mellitus with diabetic neuropathy, unspecified: Secondary | ICD-10-CM | POA: Diagnosis not present

## 2021-08-11 DIAGNOSIS — M6281 Muscle weakness (generalized): Secondary | ICD-10-CM | POA: Diagnosis not present

## 2021-08-11 DIAGNOSIS — J189 Pneumonia, unspecified organism: Secondary | ICD-10-CM | POA: Diagnosis not present

## 2021-08-11 DIAGNOSIS — I10 Essential (primary) hypertension: Secondary | ICD-10-CM | POA: Diagnosis not present

## 2021-08-14 NOTE — Progress Notes (Signed)
Cardiology Office Note:    Date:  08/15/2021   ID:  Lindsay [name], DOB 06/05/1981, MRN [id number]  PCP:  Grayce Sessions, NP  Cardiologist:  Lesleigh Noe, MD   Referring MD: Grayce Sessions, NP   Chief Complaint  Patient presents with   Congestive Heart Failure   Coronary Artery Disease    History of Present Illness:    Lindsay [name] is a 40 y.o. female with a hx of diabetes mellitus type 2, essential hypertension, anemia of chronic disease, morbid obesity, obstructive sleep apnea, NSTEMI September 2017, with pk troponin 6.64 with cath and PCI with DES to mLAD and PTCA only to 2nd diag ostium, May 2021 re-cath revealed moderate atherosclerosis but no high-grade obstruction with patent LAD stent..  EF with cath 35-45% but Echo was 55-60%.  Recent hospital admission for respiratory failure and found to have a component of acute on chronic combined systolic and diastolic heart failure.  Recent hospital stay for respiratory failure was felt to be a combined etiology including obstructive sleep apnea, CO2 retention, pneumonia, and acute on chronic diastolic heart failure.  Did develop acute kidney injury with aggressive diuresis and creatinine peaking at 1.44.  Post discharge creatinine has bumped on current diuretic regimen.   She has gained 10 pounds since discharge from the hospital.  A little more short of breath and also starting to notice that her hands are getting tight.  She did have some fullness in her chest when supine on 1 occasion since discharge.  She is doing okay today.  She has not needed sublingual nitroglycerin.  Past Medical History:  Diagnosis Date   ARF (acute renal failure) (HCC) 04/2015   Asthma    Cellulitis of right upper extremity    Coronary artery disease    Diabetes mellitus    insulin dependent   Hyperlipidemia LDL goal <70    Hypertension    NSTEMI (non-ST elevated myocardial infarction) (HCC) 08/2016   Obesity    S/P angioplasty  with stent 08/2016   DES to mLAD and PTCA only to 2nd diag ostium.     Past Surgical History:  Procedure Laterality Date   CARDIAC CATHETERIZATION N/A 09/07/2016   Procedure: Left Heart Cath and Coronary Angiography;  Surgeon: Lyn Records, MD;  Location: [facility] INVASIVE CV LAB;  Service: Cardiovascular;  Laterality: N/A;   CARDIAC CATHETERIZATION N/A 09/07/2016   Procedure: Coronary Stent Intervention;  Surgeon: Lyn Records, MD;  Location: [facility] INVASIVE CV LAB;  Service: Cardiovascular;  Laterality: N/A;   CARDIAC CATHETERIZATION N/A 09/07/2016   Procedure: Coronary Balloon Angioplasty;  Surgeon: Lyn Records, MD;  Location: [facility] INVASIVE CV LAB;  Service: Cardiovascular;  Laterality: N/A;   CESAREAN SECTION     IRRIGATION AND DEBRIDEMENT SHOULDER Right 04/30/2015   Procedure: IRRIGATION AND DEBRIDEMENT SHOULDER;  Surgeon: Sheral Apley, MD;  Location: [facility] OR;  Service: Orthopedics;  Laterality: Right;   IRRIGATION AND DEBRIDEMENT SHOULDER Right 05/01/2015   LEFT HEART CATH AND CORONARY ANGIOGRAPHY N/A 04/25/2020   Procedure: LEFT HEART CATH AND CORONARY ANGIOGRAPHY;  Surgeon: Lyn Records, MD;  Location: [facility] INVASIVE CV LAB;  Service: Cardiovascular;  Laterality: N/A;   LEG SURGERY     SHOULDER ARTHROSCOPY Right 04/30/2015   Procedure: ARTHROSCOPY SHOULDER;  Surgeon: Sheral Apley, MD;  Location: [facility] OR;  Service: Orthopedics;  Laterality: Right;   TONSILLECTOMY      Current Medications: Current Meds  Medication Sig   Accu-Chek  FastClix Lancets MISC USE 1 TO CHECK GLUCOSE 4 TIMES DAILY   albuterol (VENTOLIN HFA) 108 (90 Base) MCG/ACT inhaler INHALE 2 PUFFS BY MOUTH EVERY 6 HOURS AS NEEDED FOR WHEEZING FOR SHORTNESS OF BREATH (Patient taking differently: Inhale 2 puffs into the lungs every 6 (six) hours as needed for wheezing or shortness of breath.)   amLODipine (NORVASC) 10 MG tablet Take 1 tablet (10 mg total) by mouth daily.   aspirin 81 MG tablet Take 1 tablet (81 mg total) by mouth  daily.   atorvastatin (LIPITOR) 40 MG tablet Take 1 tablet (40 mg total) by mouth daily.   baclofen (LIORESAL) 10 MG tablet Take 0.5-1 tablets (5-10 mg total) by mouth 3 (three) times daily as needed for muscle spasms.   BD PEN NEEDLE NANO 2ND GEN 32G X 4 MM MISC    BD VEO INSULIN SYRINGE U/F 31G X 15/64" 1 ML MISC USE  SYRINGE ONCE DAILY   Continuous Blood Gluc Sensor (DEXCOM G6 SENSOR) MISC Inject into the skin.   Continuous Blood Gluc Transmit (DEXCOM G6 TRANSMITTER) MISC Inject into the skin.   diphenhydrAMINE (BENADRYL) 25 MG tablet Take 50 mg by mouth in the morning and at bedtime. Allergies   DULoxetine (CYMBALTA) 30 MG capsule Take 1 capsule (30 mg total) by mouth daily.   fluticasone (FLOVENT HFA) 110 MCG/ACT inhaler Inhale 2 puffs into the lungs 2 (two) times daily.   gabapentin (NEURONTIN) 300 MG capsule TAKE 2 CAPSULES BY MOUTH THREE TIMES DAILY   glucose blood (ACCU-CHEK AVIVA PLUS) test strip Use as instructed to test blood sugar 4 times daily.   HUMULIN R U-500 KWIKPEN 500 UNIT/ML kwikpen Inject 75-80 Units into the skin 3 (three) times daily with meals. AM 80 units  Noon 75 units  Evening 75 units If BG above 200 add 5 units to prescribed dose   hydrALAZINE (APRESOLINE) 100 MG tablet Take 1 tablet (100 mg total) by mouth 3 (three) times daily.   Insulin Pen Needle (B-D UF III MINI PEN NEEDLES) 31G X 5 MM MISC Use as instructed. Monitor blood glucose levels three times per day   isosorbide mononitrate (IMDUR) 60 MG 24 hr tablet Take 1 tablet (60 mg total) by mouth daily.   liraglutide (VICTOZA) 18 MG/3ML SOPN Inject 1.8 mg into the skin at bedtime.    Melatonin 10 MG TABS Take 3 tablets by mouth at bedtime.   metoprolol tartrate (LOPRESSOR) 50 MG tablet Take 1 tablet (50 mg total) by mouth 2 (two) times daily.   Multiple Vitamins-Minerals (MULTIVITAMIN PO) Take 1 tablet by mouth daily.   nitroGLYCERIN (NITROSTAT) 0.4 MG SL tablet Place 1 tablet (0.4 mg total) under the tongue  every 5 (five) minutes as needed for chest pain. Please keep upcoming appt in October 2022. Final Attempt   ofloxacin (OCUFLOX) 0.3 % ophthalmic solution Place 1 drop into the left eye 4 (four) times daily.   potassium chloride (KLOR-CON) 10 MEQ tablet Take 1 tablet (10 mEq total) by mouth daily.   prednisoLONE acetate (PRED FORTE) 1 % ophthalmic suspension Place 1 drop into the left eye 4 (four) times daily.   [DISCONTINUED] torsemide (DEMADEX) 20 MG tablet Take 2 tablets (40 mg total) by mouth daily.     Allergies:   Hydrazine yellow [tartrazine], Lisinopril, and Tylenol [acetaminophen]   Social History   Socioeconomic History   Marital status: Single    Spouse name: Not on file   Number of children: Not on file  Years of education: Not on file   Highest education level: Not on file  Occupational History   Not on file  Tobacco Use   Smoking status: Never   Smokeless tobacco: Never  Vaping Use   Vaping Use: Never used  Substance and Sexual Activity   Alcohol use: Yes    Comment: socially   Drug use: No   Sexual activity: Not on file  Other Topics Concern   Not on file  Social History Narrative   Not on file   Social Determinants of Health   Financial Resource Strain: Not on file  Food Insecurity: Not on file  Transportation Needs: Not on file  Physical Activity: Not on file  Stress: Not on file  Social Connections: Not on file     Family History: The patient's family history includes Cancer in her maternal grandmother; Diabetes in her father and mother; Heart disease in her father and mother; Hypertension in her mother; Stroke in her maternal grandmother.  ROS:   Please see the history of present illness.    Morbidly obese.  He uses trilogy at home for sleep apnea rather than CPAP.  All other systems reviewed and are negative.  EKGs/Labs/Other Studies Reviewed:    The following studies were reviewed today:  2D Doppler echocardiogram July 2022: IMPRESSIONS      1. Left ventricular ejection fraction, by estimation, is 65 to 70%. The  left ventricle has normal function. Left ventricular endocardial border  not optimally defined to evaluate regional wall motion. There is mild left  ventricular hypertrophy. Left  ventricular diastolic parameters were normal.   2. Right ventricular systolic function is normal. The right ventricular  size is normal. There is normal pulmonary artery systolic pressure. The  estimated right ventricular systolic pressure is 18.7 mmHg.   3. The mitral valve is grossly normal. Trivial mitral valve  regurgitation.   4. The aortic valve is tricuspid. Aortic valve regurgitation is not  visualized.   5. The inferior vena cava is normal in size with greater than 50%  respiratory variability, suggesting right atrial pressure of 3 mmHg.   EKG:  EKG not repeated at  Recent Labs: 07/17/2021: ALT 29; B Natriuretic Peptide 190.7 07/30/2021: Hemoglobin 8.7; Platelets 259 07/31/2021: Magnesium 1.8 08/08/2021: BUN 25; Creatinine, Ser 1.44; Potassium 4.3; Sodium 141  Recent Lipid Panel    Component Value Date/Time   CHOL 114 07/23/2021 0559   CHOL 182 12/05/2020 0928   TRIG 62 07/23/2021 0559   HDL 34 (L) 07/23/2021 0559   HDL 45 12/05/2020 0928   CHOLHDL 3.4 07/23/2021 0559   VLDL 12 07/23/2021 0559   LDLCALC 68 07/23/2021 0559   LDLCALC 97 12/05/2020 0928    Physical Exam:    VS:  BP 116/72   Pulse 95   Ht 5' 7.5" (1.715 m)   Wt (!) 425 lb 9.6 oz (193.1 kg)   BMI 65.67 kg/m     Wt Readings from Last 3 Encounters:  08/15/21 (!) 425 lb 9.6 oz (193.1 kg)  07/31/21 (!) 415 lb 8 oz (188.5 kg)  06/03/21 (!) 415 lb 11.2 oz (188.6 kg)     GEN: Morbid obesity. No acute distress HEENT: Normal NECK: No JVD. LYMPHATICS: No lymphadenopathy CARDIAC: 2/6 systolic murmur. RRR S4 gallop, and 1-2+ edema in ankles and shins. VASCULAR:  Normal Pulses. No bruits. RESPIRATORY:  Clear to auscultation without rales, wheezing or  rhonchi  ABDOMEN: Soft, non-tender, non-distended, No pulsatile mass, MUSCULOSKELETAL: No deformity  SKIN: Warm and dry NEUROLOGIC:  Alert and oriented x 3 PSYCHIATRIC:  Normal affect   ASSESSMENT:    1. Coronary artery disease involving native coronary artery of native heart with angina pectoris (HCC)   2. Acute on chronic diastolic heart failure (HCC)   3. Morbid obesity (HCC)   4. OSA (obstructive sleep apnea)   5. 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 Mixed hyperlipidemia   7. Essential hypertension    PLAN:    In order of problems listed above:  Secondary prevention discussed.  She has difficult time exercising but is trying and has rehab coming out to see her. She has begun gaining weight again and is at least 10 pounds above the discharge weight on August 11.  Increase torsemide to 40 mg twice daily.  Bmet and BNP today as well as an 1 week.  Increase Klor-Con to 20 mEq/day from 10.  May need to add SGLT2 therapy even empagliflozin or dapagliflozin for diastolic heart failure. Observed Now on trilogy and compliant Low carbohydrate diet Continue high intensity statin Diuretic therapy is also keeping blood pressure down.  Overall education and awareness concerning primary/secondary risk prevention was discussed in detail: LDL less than 70, hemoglobin A1c less than 7, blood pressure target less than 130/80 mmHg, >150 minutes of moderate aerobic activity per week, avoidance of smoking, weight control (via diet and exercise), and continued surveillance/management of/for obstructive sleep apnea.   2 month f/u.  Medication Adjustments/Labs and Tests Ordered: Current medicines are reviewed at length with the patient today.  Concerns regarding medicines are outlined above.  Orders Placed This Encounter  Procedures   Basic metabolic panel   Pro b natriuretic peptide   Meds ordered this encounter  Medications   torsemide (DEMADEX) 20 MG tablet    Sig: Take  2 tablets (40 mg total) by mouth 2 (two) times daily.    Dispense:  360 tablet    Refill:  3    Dose change    There are no Patient Instructions on file for this visit.   Signed, Lesleigh Noe, MD  08/15/2021 3:29 PM    [facility]

## 2021-08-15 ENCOUNTER — Ambulatory Visit: Payer: Medicaid Other | Admitting: Interventional Cardiology

## 2021-08-15 ENCOUNTER — Other Ambulatory Visit: Payer: Self-pay

## 2021-08-15 ENCOUNTER — Encounter: Payer: Self-pay | Admitting: Interventional Cardiology

## 2021-08-15 VITALS — BP 116/72 | HR 95 | Ht 67.5 in | Wt >= 6400 oz

## 2021-08-15 DIAGNOSIS — E11622 Type 2 diabetes mellitus with other skin ulcer: Secondary | ICD-10-CM | POA: Diagnosis not present

## 2021-08-15 DIAGNOSIS — I25119 Atherosclerotic heart disease of native coronary artery with unspecified angina pectoris: Secondary | ICD-10-CM

## 2021-08-15 DIAGNOSIS — I5033 Acute on chronic diastolic (congestive) heart failure: Secondary | ICD-10-CM

## 2021-08-15 DIAGNOSIS — I10 Essential (primary) hypertension: Secondary | ICD-10-CM

## 2021-08-15 DIAGNOSIS — L97909 Non-pressure chronic ulcer of unspecified part of unspecified lower leg with unspecified severity: Secondary | ICD-10-CM | POA: Diagnosis not present

## 2021-08-15 DIAGNOSIS — E782 Mixed hyperlipidemia: Secondary | ICD-10-CM | POA: Diagnosis not present

## 2021-08-15 DIAGNOSIS — G4733 Obstructive sleep apnea (adult) (pediatric): Secondary | ICD-10-CM

## 2021-08-15 MED ORDER — TORSEMIDE 20 MG PO TABS: 40.0000 mg | ORAL_TABLET | Freq: Two times a day (BID) | ORAL | 3 refills | Status: DC

## 2021-08-15 MED ORDER — POTASSIUM CHLORIDE CRYS ER 20 MEQ PO TBCR: 20.0000 meq | EXTENDED_RELEASE_TABLET | Freq: Every day | ORAL | 3 refills | Status: DC

## 2021-08-15 NOTE — Patient Instructions (Signed)
Medication Instructions:  1) INCREASE Torsemide to 40mg  twice daily 2) INCREASE Potassium to meq once daily  *If you need a refill on your cardiac medications before your next appointment, please call your pharmacy*   Lab Work: BMET and Pro BNP today  BMET and Pro BNP in 1 week  If you have labs (blood work) drawn today and your tests are completely normal, you will receive your results only by: MyChart Message (if you have MyChart) OR A paper copy in the mail If you have any lab test that is abnormal or we need to change your treatment, we will call you to review the results.   Testing/Procedures: None   Follow-Up: At [facility], you and your health needs are our priority.  As part of our continuing mission to provide you with exceptional heart care, we have created designated Provider Care Teams.  These Care Teams include your primary Cardiologist (physician) and Advanced Practice Providers (APPs -  Physician Assistants and Nurse Practitioners) who all work together to provide you with the care you need, when you need it.  We recommend signing up for the patient portal called "MyChart".  Sign up information is provided on this After Visit Summary.  MyChart is used to connect with patients for Virtual Visits (Telemedicine).  Patients are able to view lab/test results, encounter notes, upcoming appointments, etc.  Non-urgent messages can be sent to your provider as well.   To learn more about what you can do with MyChart, go to [facility].    Your next appointment:   2 month(s)  The format for your next appointment:   In Person  Provider:   You may see [email or web address], MD or one of the following Advanced Practice Providers on your designated Care Team:   Lesleigh Noe, NP   Other Instructions

## 2021-08-16 LAB — BASIC METABOLIC PANEL
BUN/Creatinine Ratio: 18 (ref 9–23)
BUN: 18 mg/dL (ref 6–20)
CO2: 20 mmol/L (ref 20–29)
Calcium: 9.5 mg/dL (ref 8.7–10.2)
Chloride: 102 mmol/L (ref 96–106)
Creatinine, Ser: 0.99 mg/dL (ref 0.57–1.00)
Glucose: 196 mg/dL — ABNORMAL HIGH (ref 65–99)
Potassium: 3.9 mmol/L (ref 3.5–5.2)
Sodium: 143 mmol/L (ref 134–144)
eGFR: 74 mL/min/{1.73_m2} (ref >59)

## 2021-08-16 LAB — PRO B NATRIURETIC PEPTIDE: NT-Pro BNP: 60 pg/mL (ref 0–130)

## 2021-08-22 ENCOUNTER — Other Ambulatory Visit: Payer: Medicaid Other | Admitting: *Deleted

## 2021-08-22 ENCOUNTER — Other Ambulatory Visit: Payer: Self-pay

## 2021-08-22 DIAGNOSIS — I5033 Acute on chronic diastolic (congestive) heart failure: Secondary | ICD-10-CM

## 2021-08-23 LAB — PRO B NATRIURETIC PEPTIDE: NT-Pro BNP: 113 pg/mL (ref 0–130)

## 2021-08-23 LAB — BASIC METABOLIC PANEL
BUN/Creatinine Ratio: 23 (ref 9–23)
BUN: 23 mg/dL — ABNORMAL HIGH (ref 6–20)
CO2: 21 mmol/L (ref 20–29)
Calcium: 9.8 mg/dL (ref 8.7–10.2)
Chloride: 98 mmol/L (ref 96–106)
Creatinine, Ser: 0.98 mg/dL (ref 0.57–1.00)
Glucose: 158 mg/dL — ABNORMAL HIGH (ref 65–99)
Potassium: 4.2 mmol/L (ref 3.5–5.2)
Sodium: 138 mmol/L (ref 134–144)
eGFR: 75 mL/min/{1.73_m2} (ref >59)

## 2021-08-27 DIAGNOSIS — H25813 Combined forms of age-related cataract, bilateral: Secondary | ICD-10-CM | POA: Diagnosis not present

## 2021-08-28 ENCOUNTER — Ambulatory Visit (INDEPENDENT_AMBULATORY_CARE_PROVIDER_SITE_OTHER): Payer: Medicaid Other | Admitting: Primary Care

## 2021-08-28 ENCOUNTER — Other Ambulatory Visit: Payer: Self-pay

## 2021-08-28 ENCOUNTER — Encounter (INDEPENDENT_AMBULATORY_CARE_PROVIDER_SITE_OTHER): Payer: Self-pay | Admitting: Primary Care

## 2021-08-28 DIAGNOSIS — E1140 Type 2 diabetes mellitus with diabetic neuropathy, unspecified: Secondary | ICD-10-CM | POA: Diagnosis not present

## 2021-08-28 DIAGNOSIS — Z794 Long term (current) use of insulin: Secondary | ICD-10-CM

## 2021-08-28 DIAGNOSIS — J45909 Unspecified asthma, uncomplicated: Secondary | ICD-10-CM | POA: Diagnosis not present

## 2021-08-28 DIAGNOSIS — Z760 Encounter for issue of repeat prescription: Secondary | ICD-10-CM

## 2021-08-28 DIAGNOSIS — R202 Paresthesia of skin: Secondary | ICD-10-CM

## 2021-08-28 DIAGNOSIS — R200 Anesthesia of skin: Secondary | ICD-10-CM | POA: Diagnosis not present

## 2021-08-28 MED ORDER — FLUTICASONE PROPIONATE HFA 110 MCG/ACT IN AERO: 2.0000 | INHALATION_SPRAY | Freq: Two times a day (BID) | RESPIRATORY_TRACT | 12 refills | Status: DC

## 2021-08-28 MED ORDER — GABAPENTIN 300 MG PO CAPS: 600.0000 mg | ORAL_CAPSULE | Freq: Three times a day (TID) | ORAL | 3 refills | Status: DC

## 2021-08-28 MED ORDER — DULOXETINE HCL 30 MG PO CPEP: 30.0000 mg | ORAL_CAPSULE | Freq: Every day | ORAL | 1 refills | Status: DC

## 2021-08-28 MED ORDER — ALBUTEROL SULFATE HFA 108 (90 BASE) MCG/ACT IN AERS: 2.0000 | INHALATION_SPRAY | Freq: Four times a day (QID) | RESPIRATORY_TRACT | 1 refills | Status: DC | PRN

## 2021-08-28 MED ORDER — ATORVASTATIN CALCIUM 20 MG PO TABS: 20.0000 mg | ORAL_TABLET | Freq: Every day | ORAL | 3 refills | Status: DC

## 2021-08-28 NOTE — Progress Notes (Signed)
[facility]  Telephone Note  I connected with Lindsay [name], on 08/28/2021 at 11:47 AM  by telephone and verified that I am speaking with the correct person using two identifiers.   Consent: I discussed the limitations, risks, security and privacy concerns of performing an evaluation and management service by telephone and the availability of in person appointments. I also discussed with the patient that there may be a patient responsible charge related to this service. The patient expressed understanding and agreed to proceed.   Location of Patient: Home  Location of Provider: [facility] [place] participating in Telemedicine visit: Lindsay D Ange Juluis Mire,  NP Llana Aliment , CMA  History of Present Illness: [name] is a 40 year old female requesting medication refills.    Past Medical History:  Diagnosis Date   ARF (acute renal failure) ([place]) 04/2015   Asthma    Cellulitis of right upper extremity    Coronary artery disease    Diabetes mellitus    insulin dependent   Hyperlipidemia LDL goal <70    Hypertension    NSTEMI (non-ST elevated myocardial infarction) ([place]) 08/2016   Obesity    S/P angioplasty with stent 08/2016   DES to mLAD and PTCA only to 2nd diag ostium.    Allergies  Allergen Reactions   Hydrazine Yellow [Tartrazine] Shortness Of Breath and Swelling    Swelling mostly noticed in legs and feet, retaining urination, shortness of breaht, and minor chest pain   Lisinopril Shortness Of Breath    Was on prinzide; had sob/chest pain on it.   Tylenol [Acetaminophen] Itching and Swelling    Itching of the mouth, swelling of tongue and stomach started hurting    Current Outpatient Medications on File Prior to Visit  Medication Sig Dispense Refill   Accu-Chek FastClix Lancets MISC USE 1 TO CHECK GLUCOSE 4 TIMES DAILY 102 each  2   albuterol (VENTOLIN HFA) 108 (90 Base) MCG/ACT inhaler INHALE 2 PUFFS BY MOUTH EVERY 6 HOURS AS NEEDED FOR WHEEZING FOR SHORTNESS OF BREATH (Patient taking differently: Inhale 2 puffs into the lungs every 6 (six) hours as needed for wheezing or shortness of breath.) 18 g 2   amLODipine (NORVASC) 10 MG tablet Take 1 tablet (10 mg total) by mouth daily. 90 tablet 0   aspirin 81 MG tablet Take 1 tablet (81 mg total) by mouth daily. 30 tablet 11   atorvastatin (LIPITOR) 40 MG tablet Take 1 tablet (40 mg total) by mouth daily. 90 tablet 3   baclofen (LIORESAL) 10 MG tablet Take 0.5-1 tablets (5-10 mg total) by mouth 3 (three) times daily as needed for muscle spasms. 30 tablet 3   BD PEN NEEDLE NANO 2ND GEN 32G X 4 MM MISC      BD VEO INSULIN SYRINGE U/F 31G X 15/64" 1 ML MISC USE  SYRINGE ONCE DAILY 100 each 1   Blood Glucose Monitoring Suppl (ACCU-CHEK AVIVA PLUS) w/Device KIT 1 each by Does not apply route as directed. (Patient not taking: Reported on 08/15/2021) 1 kit 0   Continuous Blood Gluc Sensor (DEXCOM G6 SENSOR) MISC Inject into the skin.     Continuous Blood Gluc Transmit (DEXCOM G6 TRANSMITTER) MISC Inject into  the skin.     diphenhydrAMINE (BENADRYL) 25 MG tablet Take 50 mg by mouth in the morning and at bedtime. Allergies     DULoxetine (CYMBALTA) 30 MG capsule Take 1 capsule (30 mg total) by mouth daily. 90 capsule 0   empagliflozin (JARDIANCE) 10 MG TABS tablet Take 10 mg by mouth daily. (Patient not taking: Reported on 08/15/2021) 30 tablet 3   fluticasone (FLOVENT HFA) 110 MCG/ACT inhaler Inhale 2 puffs into the lungs 2 (two) times daily. 1 Inhaler 12   gabapentin (NEURONTIN) 300 MG capsule TAKE 2 CAPSULES BY MOUTH THREE TIMES DAILY 180 capsule 3   glucose blood (ACCU-CHEK AVIVA PLUS) test strip Use as instructed to test blood sugar 4 times daily. 100 each 12   HUMULIN R U-500 KWIKPEN 500 UNIT/ML kwikpen Inject 75-80 Units into the skin 3 (three) times daily with meals. AM 80 units   Noon 75 units  Evening 75 units If BG above 200 add 5 units to prescribed dose     hydrALAZINE (APRESOLINE) 100 MG tablet Take 1 tablet (100 mg total) by mouth 3 (three) times daily. 90 tablet 1   Insulin Pen Needle (B-D UF III MINI PEN NEEDLES) 31G X 5 MM MISC Use as instructed. Monitor blood glucose levels three times per day 90 each 1   isosorbide mononitrate (IMDUR) 60 MG 24 hr tablet Take 1 tablet (60 mg total) by mouth daily. 30 tablet 1   liraglutide (VICTOZA) 18 MG/3ML SOPN Inject 1.8 mg into the skin at bedtime.      Melatonin 10 MG TABS Take 3 tablets by mouth at bedtime.     metoprolol tartrate (LOPRESSOR) 50 MG tablet Take 1 tablet (50 mg total) by mouth 2 (two) times daily. 60 tablet 1   Multiple Vitamins-Minerals (MULTIVITAMIN PO) Take 1 tablet by mouth daily.     nitroGLYCERIN (NITROSTAT) 0.4 MG SL tablet Place 1 tablet (0.4 mg total) under the tongue every 5 (five) minutes as needed for chest pain. Please keep upcoming appt in October 2022. Final Attempt 25 tablet 2   ofloxacin (OCUFLOX) 0.3 % ophthalmic solution Place 1 drop into the left eye 4 (four) times daily.     potassium chloride SA (KLOR-CON) 20 MEQ tablet Take 1 tablet (20 mEq total) by mouth daily. 90 tablet 3   prednisoLONE acetate (PRED FORTE) 1 % ophthalmic suspension Place 1 drop into the left eye 4 (four) times daily.     torsemide (DEMADEX) 20 MG tablet Take 2 tablets (40 mg total) by mouth 2 (two) times daily. 360 tablet 3   No current facility-administered medications on file prior to visit.    Observations/Objective: There were no vitals taken for this visit. Review of Systems  Respiratory:  Positive for shortness of breath and wheezing.   All other systems reviewed and are negative.   Assessment and Plan: Lindsay [name] was seen today for medication refill.  Diagnoses and all orders for this visit:  Uncomplicated asthma, unspecified asthma severity, unspecified whether persistent -     albuterol  (VENTOLIN HFA) 108 (90 Base) MCG/ACT inhaler; Inhale 2 puffs into the lungs every 6 (six) hours as needed for wheezing or shortness of breath.  Medication refill albuterol (VENTOLIN HFA) 108 (90 Base) MCG/ACT inhaler; Inhale 2 puffs into the lungs every 6 (six) hours as needed for wheezing or shortness of breath. -     atorvastatin (LIPITOR) 20 MG tablet; Take 1 tablet (20 mg total) by mouth daily. -  DULoxetine (CYMBALTA) 30 MG capsule; Take 1 capsule (30 mg total) by mouth daily. -     fluticasone (FLOVENT HFA) 110 MCG/ACT inhaler; Inhale 2 puffs into the lungs 2 (two) times daily. -     gabapentin (NEURONTIN) 300 MG capsule; Take 2 capsules (600 mg total) by mouth 3 (three) times daily.   Numbness and tingling -     DULoxetine (CYMBALTA) 30 MG capsule; Take 1 capsule (30 mg total) by mouth daily.  Type 2 diabetes mellitus with diabetic neuropathy, with long-term current use of insulin (HCC) -     gabapentin (NEURONTIN) 300 MG capsule; Take 2 capsules (600 mg total) by mouth 3 (three) times daily.  Other orders -     atorvastatin (LIPITOR) 20 MG tablet; Take 1 tablet (20 mg total) by mouth daily. -     fluticasone (FLOVENT HFA) 110 MCG/ACT inhaler; Inhale 2 puffs into the lungs 2 (two) times daily.  Follow Up Instructions: Keep schedule appt   I discussed the assessment and treatment plan with the patient. The patient was provided an opportunity to ask questions and all were answered. The patient agreed with the plan and demonstrated an understanding of the instructions.   The patient was advised to call back or seek an in-person evaluation if the symptoms worsen or if the condition fails to improve as anticipated.     I provided 15 minutes total of non-face-to-face time during this encounter including median intraservice time, reviewing previous notes, investigations, ordering medications, medical decision making, coordinating care and patient verbalized understanding at the end of  the visit.    This note has been created with Surveyor, quantity. Any transcriptional errors are unintentional.   [name], NP 08/28/2021, 11:47 AM

## 2021-09-03 ENCOUNTER — Other Ambulatory Visit: Payer: Self-pay

## 2021-09-03 ENCOUNTER — Ambulatory Visit: Payer: Medicaid Other | Admitting: Podiatry

## 2021-09-03 ENCOUNTER — Encounter: Payer: Self-pay | Admitting: Podiatry

## 2021-09-03 ENCOUNTER — Telehealth: Payer: Self-pay | Admitting: *Deleted

## 2021-09-03 DIAGNOSIS — E1142 Type 2 diabetes mellitus with diabetic polyneuropathy: Secondary | ICD-10-CM | POA: Diagnosis not present

## 2021-09-03 DIAGNOSIS — E1140 Type 2 diabetes mellitus with diabetic neuropathy, unspecified: Secondary | ICD-10-CM | POA: Diagnosis not present

## 2021-09-03 DIAGNOSIS — M79675 Pain in left toe(s): Secondary | ICD-10-CM | POA: Diagnosis not present

## 2021-09-03 DIAGNOSIS — M79674 Pain in right toe(s): Secondary | ICD-10-CM | POA: Diagnosis not present

## 2021-09-03 DIAGNOSIS — B351 Tinea unguium: Secondary | ICD-10-CM

## 2021-09-03 DIAGNOSIS — Z794 Long term (current) use of insulin: Secondary | ICD-10-CM | POA: Diagnosis not present

## 2021-09-03 DIAGNOSIS — L600 Ingrowing nail: Secondary | ICD-10-CM | POA: Diagnosis not present

## 2021-09-03 DIAGNOSIS — E1165 Type 2 diabetes mellitus with hyperglycemia: Secondary | ICD-10-CM | POA: Diagnosis not present

## 2021-09-03 NOTE — Telephone Encounter (Signed)
   [facility] Pre-operative Risk Assessment    Patient Name: Lindsay [name]  DOB: 10-Aug-1981 MRN: [id number]  HEARTCARE STAFF:  - IMPORTANT!!!!!! Under Visit Info/Reason for Call, type in Other and utilize the format Clearance MM/DD/YY or Clearance TBD. Do not use dashes or single digits. - Please review there is not already an duplicate clearance open for this procedure. - If request is for dental extraction, please clarify the # of teeth to be extracted. - If the patient is currently at the dentist's office, call Pre-Op Callback Staff (MA/nurse) to input urgent request.  - If the patient is not currently in the dentist office, please route to the Pre-Op pool.  Request for surgical clearance:  What type of surgery is being performed? Cataract surgery  When is this surgery scheduled? Left eye 11/06/21 Right eye 12/04/21  What type of clearance is required (medical clearance vs. Pharmacy clearance to hold med vs. Both)? Both  Are there any medications that need to be held prior to surgery and how long? None requested but pt on [address]  Practice name and name of physician performing surgery? [facility], Dr Jovita Kussmaul  What is the office phone number? [place].   What is the office fax number? [phone]  8.   Anesthesia type (None, local, MAC, general) ? Not listed   Juventino Slovak 09/03/2021, 3:32 PM  _________________________________________________________________   (provider comments below)

## 2021-09-04 NOTE — Telephone Encounter (Signed)
   Primary Cardiologist: Lesleigh Noe, MD  Chart reviewed as part of pre-operative protocol coverage. Cataract extractions are recognized in guidelines as low risk surgeries that do not typically require specific preoperative testing or holding of blood thinner therapy. Therefore, given past medical history and time since last visit, based on ACC/AHA guidelines, Lindsay [name] would be at acceptable risk for the planned procedure without further cardiovascular testing.   I will route this recommendation to the requesting party via Epic fax function and remove from pre-op pool.  Please call with questions.  Lennon Alstrom, PA-C 09/04/2021, 10:36 AM

## 2021-09-05 ENCOUNTER — Ambulatory Visit (INDEPENDENT_AMBULATORY_CARE_PROVIDER_SITE_OTHER): Payer: Medicaid Other | Admitting: Primary Care

## 2021-09-08 DIAGNOSIS — J9601 Acute respiratory failure with hypoxia: Secondary | ICD-10-CM | POA: Diagnosis not present

## 2021-09-08 DIAGNOSIS — I5033 Acute on chronic diastolic (congestive) heart failure: Secondary | ICD-10-CM | POA: Diagnosis not present

## 2021-09-08 DIAGNOSIS — E662 Morbid (severe) obesity with alveolar hypoventilation: Secondary | ICD-10-CM | POA: Diagnosis not present

## 2021-09-09 NOTE — Progress Notes (Signed)
  Subjective:  Patient ID: Lindsay [name], female    DOB: [birth year],  MRN: [id number]  Shelsea D Pul[place]i presents to clinic today for at risk foot care with history of diabetic neuropathy and painful thick toenails that are difficult to trim. Pain interferes with ambulation. Aggravating factors include wearing enclosed shoe gear. Pain is relieved with periodic professional debridement.  Patient states blood glucose was 259 mg/dl  on yesterday. Pt states she ran out of insulin . Last A1c was 8.3%.  She notes no new pedal concerns on today's visit.  PCP is Grayce Sessions, NP , and last visit was 08/28/2021.  Allergies  Allergen Reactions   Acetaminophen Itching and Swelling    Itching of the mouth, swelling of tongue and stomach started hurting mild   Hydrazine Yellow [Tartrazine] Shortness Of Breath and Swelling    Swelling mostly noticed in legs and feet, retaining urination, shortness of breaht, and minor chest pain   Lisinopril Shortness Of Breath    Was on prinzide; had sob/chest pain on it.    Review of Systems: Negative except as noted in the HPI. Objective:   Constitutional Lindsay [name] is a pleasant 40 y.o. African American female, morbidly obese in NAD. AAO x 3.   Vascular Capillary fill time to digits <3 seconds b/l lower extremities. Palpable DP pulse(s) b/l lower extremities Faintly palpable PT pulse(s) b/l lower extremities. Pedal hair sparse. Lower extremity skin temperature gradient within normal limits. No pain with calf compression b/l. No cyanosis or clubbing noted.  Neurologic Normal speech. Oriented to person, place, and time. Protective sensation diminished with 10g monofilament b/l.  Dermatologic Skin warm and supple b/l lower extremities. No open wounds b/l lower extremities. No interdigital macerations b/l lower extremities. Toenails 1-5 b/l elongated, discolored, dystrophic, thickened, crumbly with subungual debris and tenderness to dorsal palpation.  Incurvated nailplate lateral border(s) R hallux.  Nail border hypertrophy present. There is tenderness to palpation.  Early granulation tissue present medial border. No purulence, no drainage, no malodor.  Orthopedic: Normal muscle strength 5/5 to all lower extremity muscle groups bilaterally. No gross bony deformities b/l lower extremities. Utilizes cane for ambulation assistance.   Radiographs: None Assessment:   1. Pain due to onychomycosis of toenails of both feet   2. Ingrown nail of great toe of right foot   3. Diabetic peripheral neuropathy associated with type 2 diabetes mellitus (HCC)    Plan:  Patient was evaluated and treated and all questions answered. Consent given for treatment as described below: -Examined patient. -Continue diabetic foot care principles: inspect feet daily, monitor glucose as recommended by PCP and/or Endocrinologist, and follow prescribed diet per PCP, Endocrinologist and/or dietician. -Patient to continue soft, supportive shoe gear daily. -Toenails 1-5 b/l were debrided in length and girth with sterile nail nippers and dremel without iatrogenic bleeding.  -Offending nail border debrided and curretaged R hallux utilizing sterile nail nipper and currette. Border(s) cleansed with alcohol and triple antibiotic ointment applied. Patient instructed to apply Neosporin to R hallux once daily for 7 days. -Patient to report any pedal injuries to medical professional immediately. -Patient/POA to call should there be question/concern in the interim.  Return in about 3 months (around 12/03/2021).  Freddie Breech, DPM

## 2021-09-16 NOTE — Progress Notes (Deleted)
Cardiology Office Note    Date:  09/16/2021   ID:  Lindsay [name], DOB 1981/04/26, MRN [id number]   PCP:  Grayce Sessions, NP    [facility]  Cardiologist:  Lesleigh Noe, MD   Advanced Practice Provider:  No care team member to display Electrophysiologist:  None   [phone]   No chief complaint on file.   History of Present Illness:  Lindsay [name] is a 40 y.o. female with history of CAD status post NSTEMI 08/2016 treated with DES to the mid LAD and PTCA only to the second diagonal ostium.  Recath 04/2020 moderate atherosclerosis but no high-grade obstruction with patent LAD stent.  LVEF at 35 to 45% but on echo was 55 to 60%.  Patient also has chronic diastolic and systolic CHF, hypertension, morbid obesity, OSA, DM2.  Dr. Katrinka Blazing saw the patient 07/2021 after hospitalization with respiratory failure secondary to OSA, CO2 retention pneumonia and CHF.  She had gained 10 pounds since discharge.  Torsemide increased to 40 mg twice daily.  Potassium increased to 20 mEq daily.  May need to add SGLT2 therapy possibly empagliflozin or dapagliflozin for diastolic CHF    Past Medical History:  Diagnosis Date   ARF (acute renal failure) (HCC) 04/2015   Asthma    Cellulitis of right upper extremity    Coronary artery disease    Diabetes mellitus    insulin dependent   Hyperlipidemia LDL goal <70    Hypertension    NSTEMI (non-ST elevated myocardial infarction) (HCC) 08/2016   Obesity    S/P angioplasty with stent 08/2016   DES to mLAD and PTCA only to 2nd diag ostium.     Past Surgical History:  Procedure Laterality Date   CARDIAC CATHETERIZATION N/A 09/07/2016   Procedure: Left Heart Cath and Coronary Angiography;  Surgeon: Lyn Records, MD;  Location: [facility] INVASIVE CV LAB;  Service: Cardiovascular;  Laterality: N/A;   CARDIAC CATHETERIZATION N/A 09/07/2016   Procedure: Coronary Stent Intervention;  Surgeon: Lyn Records, MD;  Location: [facility]  INVASIVE CV LAB;  Service: Cardiovascular;  Laterality: N/A;   CARDIAC CATHETERIZATION N/A 09/07/2016   Procedure: Coronary Balloon Angioplasty;  Surgeon: Lyn Records, MD;  Location: [facility] INVASIVE CV LAB;  Service: Cardiovascular;  Laterality: N/A;   CESAREAN SECTION     IRRIGATION AND DEBRIDEMENT SHOULDER Right 04/30/2015   Procedure: IRRIGATION AND DEBRIDEMENT SHOULDER;  Surgeon: Sheral Apley, MD;  Location: [facility] OR;  Service: Orthopedics;  Laterality: Right;   IRRIGATION AND DEBRIDEMENT SHOULDER Right 05/01/2015   LEFT HEART CATH AND CORONARY ANGIOGRAPHY N/A 04/25/2020   Procedure: LEFT HEART CATH AND CORONARY ANGIOGRAPHY;  Surgeon: Lyn Records, MD;  Location: [facility] INVASIVE CV LAB;  Service: Cardiovascular;  Laterality: N/A;   LEG SURGERY     SHOULDER ARTHROSCOPY Right 04/30/2015   Procedure: ARTHROSCOPY SHOULDER;  Surgeon: Sheral Apley, MD;  Location: [facility] OR;  Service: Orthopedics;  Laterality: Right;   TONSILLECTOMY      Current Medications: No outpatient medications have been marked as taking for the 09/23/21 encounter (Appointment) with Dyann Kief, PA-C.     Allergies:   Acetaminophen, Hydrazine yellow [tartrazine], and Lisinopril   Social History   Socioeconomic History   Marital status: Single    Spouse name: Not on file   Number of children: Not on file   Years of education: Not on file   Highest education level: Not on file  Occupational History  Not on file  Tobacco Use   Smoking status: Never   Smokeless tobacco: Never  Vaping Use   Vaping Use: Never used  Substance and Sexual Activity   Alcohol use: Yes    Comment: socially   Drug use: No   Sexual activity: Not on file  Other Topics Concern   Not on file  Social History Narrative   Not on file   Social Determinants of Health   Financial Resource Strain: Not on file  Food Insecurity: Not on file  Transportation Needs: Not on file  Physical Activity: Not on file  Stress: Not on file  Social  Connections: Not on file     Family History:  The patient's ***family history includes Cancer in her maternal grandmother; Diabetes in her father and mother; Heart disease in her father and mother; Hypertension in her mother; Stroke in her maternal grandmother.   ROS:   Please see the history of present illness.    ROS All other systems reviewed and are negative.   PHYSICAL EXAM:   VS:  There were no vitals taken for this visit.  Physical Exam  GEN: Well nourished, well developed, in no acute distress  HEENT: normal  Neck: no JVD, carotid bruits, or masses Cardiac:RRR; no murmurs, rubs, or gallops  Respiratory:  clear to auscultation bilaterally, normal work of breathing GI: soft, nontender, nondistended, + BS Ext: without cyanosis, clubbing, or edema, Good distal pulses bilaterally MS: no deformity or atrophy  Skin: warm and dry, no rash Neuro:  Alert and Oriented x 3, Strength and sensation are intact Psych: euthymic mood, full affect  Wt Readings from Last 3 Encounters:  08/15/21 (!) 425 lb 9.6 oz (193.1 kg)  07/31/21 (!) 415 lb 8 oz (188.5 kg)  06/03/21 (!) 415 lb 11.2 oz (188.6 kg)      Studies/Labs Reviewed:   EKG:  EKG is*** ordered today.  The ekg ordered today demonstrates ***  Recent Labs: 07/17/2021: ALT 29; B Natriuretic Peptide 190.7 07/30/2021: Hemoglobin 8.7; Platelets 259 07/31/2021: Magnesium 1.8 08/22/2021: BUN 23; Creatinine, Ser 0.98; NT-Pro BNP 113; Potassium 4.2; Sodium 138   Lipid Panel    Component Value Date/Time   CHOL 114 07/23/2021 0559   CHOL 182 12/05/2020 0928   TRIG 62 07/23/2021 0559   HDL 34 (L) 07/23/2021 0559   HDL 45 12/05/2020 0928   CHOLHDL 3.4 07/23/2021 0559   VLDL 12 07/23/2021 0559   LDLCALC 68 07/23/2021 0559   LDLCALC 97 12/05/2020 0928    Additional studies/ records that were reviewed today include:  2D Doppler echocardiogram July 2022: IMPRESSIONS     1. Left ventricular ejection fraction, by estimation, is 65 to  70%. The  left ventricle has normal function. Left ventricular endocardial border  not optimally defined to evaluate regional wall motion. There is mild left  ventricular hypertrophy. Left  ventricular diastolic parameters were normal.   2. Right ventricular systolic function is normal. The right ventricular  size is normal. There is normal pulmonary artery systolic pressure. The  estimated right ventricular systolic pressure is 18.7 mmHg.   3. The mitral valve is grossly normal. Trivial mitral valve  regurgitation.   4. The aortic valve is tricuspid. Aortic valve regurgitation is not  visualized.   5. The inferior vena cava is normal in size with greater than 50%  respiratory variability, suggesting right atrial pressure of 3 mmHg.      Risk Assessment/Calculations:   {Does this patient have ATRIAL FIBRILLATION?:[id number]}  ASSESSMENT:    No diagnosis found.   PLAN:  In order of problems listed above:  CAD status post NSTEMI 08/2016 treated with DES to the mid LAD and PTCA only to the second diagonal ostium.  Recath 04/2020 moderate atherosclerosis but no high-grade obstruction with patent LAD stent.  LVEF at 35 to 45% but on echo was 55 to 60%.  Chronic diastolic CHF LVEF has normalized on echo 06/2021  Hypertension  OSA  Morbid obesity  DM2  Shared Decision Making/Informed Consent   {Are you ordering a CV Procedure (e.g. stress test, cath, DCCV, TEE, etc)?   Press F2        :[id number]}    Medication Adjustments/Labs and Tests Ordered: Current medicines are reviewed at length with the patient today.  Concerns regarding medicines are outlined above.  Medication changes, Labs and Tests ordered today are listed in the Patient Instructions below. There are no Patient Instructions on file for this visit.   Elson Clan, PA-C  09/16/2021 1:36 PM    [facility] [address] [place], [place], [place]  [address] Phone: [phone]; Fax: [phone]

## 2021-09-23 ENCOUNTER — Ambulatory Visit: Payer: Medicaid Other | Admitting: Physician Assistant

## 2021-09-23 DIAGNOSIS — E11622 Type 2 diabetes mellitus with other skin ulcer: Secondary | ICD-10-CM

## 2021-09-23 DIAGNOSIS — I10 Essential (primary) hypertension: Secondary | ICD-10-CM

## 2021-09-23 DIAGNOSIS — I25119 Atherosclerotic heart disease of native coronary artery with unspecified angina pectoris: Secondary | ICD-10-CM

## 2021-09-23 DIAGNOSIS — G4733 Obstructive sleep apnea (adult) (pediatric): Secondary | ICD-10-CM

## 2021-09-23 DIAGNOSIS — I5032 Chronic diastolic (congestive) heart failure: Secondary | ICD-10-CM

## 2021-10-08 DIAGNOSIS — I5033 Acute on chronic diastolic (congestive) heart failure: Secondary | ICD-10-CM | POA: Diagnosis not present

## 2021-10-08 DIAGNOSIS — E662 Morbid (severe) obesity with alveolar hypoventilation: Secondary | ICD-10-CM | POA: Diagnosis not present

## 2021-10-08 DIAGNOSIS — J9601 Acute respiratory failure with hypoxia: Secondary | ICD-10-CM | POA: Diagnosis not present

## 2021-10-20 ENCOUNTER — Other Ambulatory Visit: Payer: Self-pay | Admitting: Physician Assistant

## 2021-10-22 ENCOUNTER — Other Ambulatory Visit: Payer: Self-pay

## 2021-10-22 NOTE — Patient Outreach (Signed)
Medicaid Managed Care   Nurse Care Manager Note  10/22/2021 Name:  Lindsay [name] MRN:  [id number] DOB:  March 09, 1981  Lindsay [name] is an 40 y.o. year old female who is [name] primary patient of Lindsay Sessions, NP.  The [facility] Managed Care Coordination team was consulted for assistance with:    CHF HTN DMII Asthma Chronic Back Pain  Lindsay [name] was given information about Medicaid Managed Care Coordination team services today. Lindsay [name] Patient agreed to services and verbal consent obtained.  Engaged with patient by telephone for initial visit in response to provider referral for case management and/or care coordination services.   Assessments/Interventions:  Review of past medical history, allergies, medications, health status, including review of consultants reports, laboratory and other test data, was performed as part of comprehensive evaluation and provision of chronic care management services.  SDOH (Social Determinants of Health) assessments and interventions performed: SDOH Interventions    Flowsheet Row Most Recent Value  SDOH Interventions   Food Insecurity Interventions Intervention Not Indicated  Financial Strain Interventions Intervention Not Indicated  Housing Interventions Intervention Not Indicated  Stress Interventions Intervention Not Indicated  Social Connections Interventions Intervention Not Indicated  Transportation Interventions Intervention Not Indicated  Depression Interventions/Treatment  Medication       Care Plan  Allergies  Allergen Reactions   Acetaminophen Itching and Swelling    Itching of the mouth, swelling of tongue and stomach started hurting mild   Hydrazine Yellow [Tartrazine] Shortness Of Breath and Swelling    Swelling mostly noticed in legs and feet, retaining urination, shortness of breaht, and minor chest pain   Lisinopril Shortness Of Breath    Was on prinzide; had sob/chest pain on it.    Medications Reviewed  Today     Reviewed by Lindsay Call, RN (Case Manager) on 10/22/21 at 1118  Med List Status: <None>   Medication Order Taking? Sig Documenting Provider Last Dose Status Informant  Accu-Chek FastClix Lancets MISC [id number] Yes USE 1 TO CHECK GLUCOSE 4 TIMES DAILY [name], [name], MD Taking Active Self  albuterol (VENTOLIN HFA) 108 (90 Base) MCG/ACT inhaler [id number] Yes Inhale 2 puffs into the lungs every 6 (six) hours as needed for wheezing or shortness of breath. Lindsay Sessions, NP Taking Active   aspirin 81 MG chewable tablet [id number] No Chew by mouth.  Patient not taking: Reported on 10/22/2021   [provider] Not Taking Active            Med Note [facility], Marisue Humble [name]   Wed Oct 22, 2021 11:09 AM) Taking PRN in case of emergencies  aspirin 81 MG tablet [id number] Yes Take 1 tablet (81 mg total) by mouth daily. Lindsay Specter, PA-C Taking Active Self  atorvastatin (LIPITOR) 20 MG tablet [id number] Yes Take 1 tablet (20 mg total) by mouth daily. Lindsay Sessions, NP Taking Active   atorvastatin (LIPITOR) 80 MG tablet [id number] No Take by mouth.  Patient not taking: Reported on 10/22/2021   [provider] Not Taking Active   baclofen (LIORESAL) 10 MG tablet [id number] Yes Take 0.5-1 tablets (5-10 mg total) by mouth 3 (three) times daily as needed for muscle spasms. Hilts, Lindsay Needle, MD Taking Active   BD PEN [name] NANO 2ND GEN 32G X 4 MM MISC [id number] Yes  [provider] Taking Active Self  BD VEO INSULIN SYRINGE U/F 31G X 15/64" 1 ML MISC [id number] Yes USE  SYRINGE ONCE DAILY Lindsay Littler, MD Taking Active Self  Blood Glucose Monitoring Suppl (ACCU-CHEK AVIVA PLUS) w/Device KIT [id number] Yes 1 each by Does not apply route as directed. Lindsay Littler, MD Taking Active   Continuous Blood Gluc Receiver [facility] G6 RECEIVER) Lindsay [name] [id number]  Dispense and use as directed [provider]  Active   Continuous Blood Gluc Sensor (DEXCOM G6 SENSOR) MISC  [id number]  Inject into the skin. [provider]  Active   Continuous Blood Gluc Transmit (DEXCOM G6 TRANSMITTER) MISC [id number]  Inject into the skin. [provider]  Active   diphenhydrAMINE (BENADRYL) 25 MG tablet [id number] No Take 50 mg by mouth in the morning and at bedtime. Allergies  Patient not taking: Reported on 10/22/2021   [provider] Not Taking Active Self           Med Note [facility], Lindsay [name]   Wed Oct 22, 2021 11:10 AM) Taking PRN for Allergies  DULoxetine (CYMBALTA) 30 MG capsule [id number] Yes Take 1 capsule (30 mg total) by mouth daily. Lindsay Sessions, NP Taking Active   fluticasone (FLOVENT HFA) 110 MCG/ACT inhaler [id number] Yes Inhale 2 puffs into the lungs 2 (two) times daily. Lindsay Sessions, NP Taking Active   gabapentin (NEURONTIN) 300 MG capsule [id number] Yes Take 2 capsules (600 mg total) by mouth 3 (three) times daily. Lindsay Sessions, NP Taking Active   gabapentin (NEURONTIN) 300 MG capsule [id number] No Take by mouth.  Patient not taking: Reported on 10/22/2021   [provider] Not Taking Active   glucose blood (ACCU-CHEK AVIVA PLUS) test strip [id number]  Use as instructed to test blood sugar 4 times daily. Lindsay Rigg, NP  Active Self  HUMULIN R U-500 KWIKPEN 500 UNIT/ML Lindsay [name] [id number] Yes Inject 75-80 Units into the skin 3 (three) times daily with meals. AM 80 units  Noon 75 units  Evening 75 units If BG above 200 add 5 units to prescribed dose [provider] Taking Active Self           Med Note Corliss Blacker, Lindsay [name]   Fri Jul 11, 2021  4:07 AM) 1 dose as of 07/21, took 25 units due to not eating   hydrALAZINE (APRESOLINE) 100 MG tablet [id number] Yes Take 1 tablet (100 mg total) by mouth 3 (three) times daily. [name], Leana Roe, MD Taking Active   hydrALAZINE (APRESOLINE) 100 MG tablet [id number] No Take by mouth.  Patient not taking: Reported on 10/22/2021   [provider] Not Taking  Active   Insulin Pen [name] (B-D UF III MINI PEN NEEDLES) 31G X 5 MM MISC [id number]  Use as instructed. Monitor blood glucose levels three times per day Lindsay Sessions, NP  Active Self  isosorbide mononitrate (IMDUR) 30 MG 24 hr tablet [id number] No TAKE 1 TABLET BY MOUTH DAILY  Patient not taking: Reported on 10/22/2021   Lyn Records, MD Not Taking Active   isosorbide mononitrate (IMDUR) 60 MG 24 hr tablet [id number] Yes Take 1 tablet (60 mg total) by mouth daily. [name], Leana Roe, MD Taking Active   ketorolac (ACULAR) 0.5 % ophthalmic solution [id number] No Insert one drop 4 times [name] day into surgical eye starting 4 days prior to surgery  Patient not taking: Reported on 10/22/2021   [provider] Not Taking Active   liraglutide (VICTOZA) 18 MG/3ML SOPN [id number] Yes Inject 1.8 mg into the skin at bedtime.  [provider] Taking Active Self           Med Note Jayme Cloud, DANIELLE  R   Mon Apr 01, 2020  9:48 AM)    losartan (COZAAR) 100 MG tablet [id number] Yes Take by mouth. [provider] Taking Active   Melatonin 10 MG TABS [id number] No Take 3 tablets by mouth at bedtime.  Patient not taking: Reported on 10/22/2021   [provider] Not Taking Active Self           Med Note [facility], Marisue Humble [name]   Wed Oct 22, 2021 11:14 AM) Taking PRN  metoprolol tartrate (LOPRESSOR) 50 MG tablet [id number] Yes Take 1 tablet (50 mg total) by mouth 2 (two) times daily. [name], Leana Roe, MD Taking Active   moxifloxacin (VIGAMOX) 0.5 % ophthalmic solution [id number] No Insert one drop into the surgical eye 3 times [name] day starting 4 days prior to surgery.  Patient not taking: Reported on 10/22/2021   [provider] Not Taking Active   Multiple Vitamins-Minerals (MULTIVITAMIN PO) [id number] Yes Take 1 tablet by mouth daily. [provider] Taking Active Self  nitroGLYCERIN (NITROSTAT) 0.4 MG SL tablet [id number] Yes Place 1 tablet (0.4 mg total) under the  tongue every 5 (five) minutes as needed for chest pain. Please keep upcoming appt in October 2022. Final Attempt Lyn Records, MD Taking Active Self  ofloxacin (OCUFLOX) 0.3 % ophthalmic solution [id number] No Place 1 drop into the left eye 4 (four) times daily.  Patient not taking: Reported on 10/22/2021   [provider] Not Taking Active   potassium chloride SA (KLOR-CON) 20 MEQ tablet [id number] Yes Take 1 tablet (20 mEq total) by mouth daily. Lyn Records, MD Taking Active   prednisoLONE acetate (PRED FORTE) 1 % ophthalmic suspension [id number] No Place 1 drop into the left eye 4 (four) times daily.  Patient not taking: Reported on 10/22/2021   [provider] Not Taking Active   torsemide (DEMADEX) 20 MG tablet [id number]  Take by mouth. [provider]  Active             Patient Active Problem List   Diagnosis Date Noted   Acute respiratory failure with hypoxia (HCC)    Acute on chronic diastolic heart failure (HCC)    CAP (community acquired pneumonia) 07/11/2021   Elevated liver enzymes 05/23/2020   Spondylosis of lumbar spine 06/22/2019   Pedal edema 06/22/2019   Hyperlipidemia 06/22/2019   Normocytic anemia 06/22/2019   Vitamin D deficiency 06/22/2019   OSA (obstructive sleep apnea) 11/03/2016   Coronary artery disease involving native coronary artery of native heart with angina pectoris (HCC) 09/11/2016   Diabetes mellitus with complication (HCC)    Chest pain of uncertain etiology 09/07/2016   Community acquired pneumonia 09/07/2016   MI, old    DM neuropathy, type II diabetes mellitus (HCC) 06/10/2016   Morbid obesity (HCC) 06/10/2016   Pressure ulcer 05/17/2015   Anaerobic abscess (HCC)    ARF (acute renal failure) (HCC)    Septic arthritis (HCC)    Essential hypertension 04/30/2015   Cellulitis 04/30/2015   Abscess of right shoulder     Conditions to be addressed/monitored per PCP order:  CHF, HTN, DMII, Asthma, and Chronic Back  Pain  Care Plan : RN Care Manager Plan of Care  Updates made by Lindsay Call, RN since 10/22/2021 12:00 AM     Problem: Chronic Disease Management and Care Coordination Needs for CHF, HTN, DM, Asthma, Chronic Back Pain   Priority: High     Long-Range Goal: Development of Plan of Care for Chronic  Disease Management and Care Cooordination Needs (CHF, HTN, DM, Asthma, Chronic Back Pain)   Start Date: 10/22/2021  Expected End Date: 02/19/2022  Priority: High  Note:   Current Barriers:  Knowledge Deficits related to plan of care for management of CHF, HTN, DMII, Asthma, and Chronic Back Pain Care Coordination needs related to Limited social support, ADL IADL limitations, Limited access to caregiver, Inability to perform ADL's independently, and Inability to perform IADL's independently Chronic Disease Management support and education needs related to CHF, HTN, DMII, Asthma, and Chronic Back Pain. Lacks caregiver support No Advanced Directives in place  RNCM Clinical Goal(s):  Patient will verbalize understanding of plan for management of CHF, HTN, DMII, Asthma, and Chronic Back Pain verbalize basic understanding of  CHF, HTN, DMII, Asthma, and Chronic Back Pain disease process and self health management plan   take all medications exactly as prescribed and will [name] provider for medication related questions attend all scheduled medical appointments: 11/05/21 Cardiology Appt. 12/02/21 Podiatry Appt, Upcoming cataract surgeries: Left eye on 11/06/21 and Right eye on 12/04/21 demonstrate Improved adherence to prescribed treatment plan for CHF, HTN, DMII, Asthma, and Chronic Back Pain as evidenced by improved control of chronic diseases. demonstrate Improved health management independence   continue to work with RN Care Manager to address care management and care coordination needs related to  CHF, HTN, DMII, Asthma, and Chronic Back Pain work with pharmacist to address complex medication  regimen related toCHF, HTN, DMII, Asthma, and Chronic Back Pain work with Child psychotherapist to address  related to the management of Limited social support, ADL IADL limitations, Inability to perform ADL's independently, and Inability to perform IADL's independently related to the management of CHF, HTN, DMII, Asthma, and Chronic Back Pain demonstrate [name] decrease in CHF, HTN, DMII, Asthma, and Chronic Pain Pain  exacerbations   will demonstrate ongoing self health care management ability    through collaboration with RN Care manager, provider, and care team.   Interventions: Inter-disciplinary care team collaboration (see longitudinal plan of care) Evaluation of current treatment plan related to  self management and patient's adherence to plan as established by provider  Pain Interventions:  New Goal Pain assessment performed Medications reviewed Reviewed provider established plan for pain management; Discussed importance of adherence to all scheduled medical appointments; Counseled on the importance of reporting any/all new or changed pain symptoms or management strategies to pain management provider; Advised patient to report to care team affect of pain on daily activities; Reviewed with patient prescribed pharmacological and nonpharmacological pain relief strategies; Screening for signs and symptoms of depression related to chronic disease state;  Assessed social determinant of health barriers;   Hypertension Interventions:  New Goal Last practice recorded BP readings:  BP Readings from Last 3 Encounters:  08/15/21 116/72  07/31/21 137/72  06/03/21 (!) 147/85  Most recent eGFR/CrCl:  Lab Results  Component Value Date   EGFR 75 08/22/2021    No components found for: CRCL  Evaluation of current treatment plan related to hypertension self management and patient's adherence to plan as established by provider; Reviewed medications with patient and discussed importance of  compliance; Discussed plans with patient for ongoing care management follow up and provided patient with direct contact information for care management team; Advised patient, providing education and rationale, to monitor blood pressure daily and record, calling PCP for findings outside established parameters;  Reviewed scheduled/upcoming provider appointments including:  Provided education on prescribed diet Low Sodium Heart Healthy;  Screening for signs  and symptoms of depression related to chronic disease state;  Assessed social determinant of health barriers;   Diabetes Interventions: Assessed patient's understanding of A1c goal: <7% Provided education to patient about basic DM disease process; Reviewed medications with patient and discussed importance of medication adherence; Counseled on importance of regular laboratory monitoring as prescribed; Discussed plans with patient for ongoing care management follow up and provided patient with direct contact information for care management team; Reviewed scheduled/upcoming provider appointments including: Cardiology Appt on 11/05/21; Podiatry Appt on 12/02/21 and Bilateral cataract surgeries in 11/06/21 Right eye and 12/04/21 Left eye.; Advised patient, providing education and rationale, to check cbg 4 times daily and record, calling PCP for findings outside established parameters; Referral made to pharmacy team for assistance with complex medication regimen; Referral made to social work team for assistance with possible PCS services; Screening for signs and symptoms of depression related to chronic disease state;  Assessed social determinant of health barriers;  Lab Results  Component Value Date   HGBA1C 8.3 (H) 07/11/2021  Heart Failure Interventions: Basic overview and discussion of pathophysiology of Heart Failure reviewed; Provided education on low sodium diet; Assessed need for readable accurate scales in home; Provided education about  placing scale on hard, flat surface; Advised patient to weigh each morning after emptying bladder; Discussed importance of daily weight and advised patient to weigh and record daily; Reviewed role of diuretics in prevention of fluid overload and management of heart failure; Discussed the importance of keeping all appointments with provider; Screening for signs and symptoms of depression related to chronic disease state;  Assessed social determinant of health barriers;  Asthma: (Status:New goal. Provided patient with basic written and verbal Asthma education on self care/management/and exacerbation prevention; Advised patient to track and manage Asthma triggers;  Provided instruction about proper use of medications used for management of Asthma including inhalers; Discussed the importance of adequate rest and management of fatigue with Asthma; Screening for signs and symptoms of depression related to chronic disease state;  Assessed social determinant of health barriers;   Patient Goals/Self-Care Activities: Patient will self administer medications as prescribed Patient will attend all scheduled provider appointments Patient will [name] pharmacy for medication refills Patient will [name] provider office for new concerns or questions Patient will work with BSW to address care coordination needs and will continue to work with the clinical team to address health care and disease management related needs.   Patient will work with pharmacist to address complex medication regimen.  Follow Up Plan:  Telephone follow up appointment with care management team member scheduled for:  11/19/2021 at 10:00 AM       Follow Up:  Patient agrees to Care Plan and Follow-up.  Plan: The Managed Medicaid care management team will reach out to the patient again over the next 30 days.  Date/time of next scheduled RN care management/care coordination outreach:  11/19/2021 at 10:00 am  Virgina Norfolk RN,  BSN [facility]  [facility] Mobile: [phone]

## 2021-10-22 NOTE — Patient Instructions (Signed)
Visit Information  Lindsay [name] was given information about Medicaid Managed Care team care coordination services as a part of their Healthy [facility] Medicaid benefit. Lindsay [name] verbally consented to engagement with the [facility] Managed Care team.   If you are experiencing a medical emergency, please call 911 or report to your local emergency department or urgent care.   If you have a non-emergency medical problem during routine business hours, please contact your provider's office and ask to speak with a nurse.   For questions related to your Healthy [facility] health plan, please call: [phone] or visit the homepage here: [email or web address]  If you would like to schedule transportation through your Healthy [facility] plan, please call the following number at least 2 days in advance of your appointment: [phone]  Call the [place] at [phone], at any time, 24 hours a day, 7 days a week. If you are in danger or need immediate medical attention call 911.  If you would like help to quit smoking, call 1-800-QUIT-NOW 205-828-8158) OR Espaol: 1-855-Djelo-Ya (6-948-546-2703) o para ms informacin haga clic aqu or Text READY to 200-400 to register via text  Ms. Deller - following are the goals we discussed in your visit today:   Goals Addressed   None     Please see education materials related to today's visit provided by MyChart link.  Patient will review all information via My Chart link.  The Managed Medicaid care management team will reach out to the patient again over the next 30 days.   Salvatore Marvel RN, BSN Community Care Coordinator [place] [facility] Mobile: [phone]   Following is a copy of your plan of care:  Care Plan : [place] [facility]  Updates made by [name], RN since 10/22/2021 12:00 AM     Problem: Chronic Disease Management and Care  Coordination Needs for CHF, HTN, DM, Asthma, Chronic Back Pain   Priority: High     Long-Range Goal: Development of Plan of Care for Chronic Disease Management and Care Cooordination Needs (CHF, HTN, DM, Asthma, Chronic Back Pain)   Start Date: 10/22/2021  Expected End Date: 02/19/2022  Priority: High  Note:   Current Barriers:  Knowledge Deficits related to plan of care for management of CHF, HTN, DMII, Asthma, and Chronic Back Pain Care Coordination needs related to Limited social support, ADL IADL limitations, Limited access to caregiver, Inability to perform ADL's independently, and Inability to perform IADL's independently Chronic Disease Management support and education needs related to CHF, HTN, DMII, Asthma, and Chronic Back Pain. Lacks caregiver support No Advanced Directives in place  RNCM Clinical Goal(s):  Patient will verbalize understanding of plan for management of CHF, HTN, DMII, Asthma, and Chronic Back Pain verbalize basic understanding of  CHF, HTN, DMII, Asthma, and Chronic Back Pain disease process and self health management plan   take all medications exactly as prescribed and will call provider for medication related questions attend all scheduled medical appointments: 11/05/21 Cardiology Appt. 12/02/21 Podiatry Appt, Upcoming cataract surgeries: Left eye on 11/06/21 and Right eye on 12/04/21 demonstrate Improved adherence to prescribed treatment plan for CHF, HTN, DMII, Asthma, and Chronic Back Pain as evidenced by improved control of chronic diseases. demonstrate Improved health management independence   continue to work with RN Care Manager to address care management and care coordination needs related to  CHF, HTN, DMII, Asthma, and Chronic Back Pain work with pharmacist to address complex medication  regimen related toCHF, HTN, DMII, Asthma, and Chronic Back Pain work with Education officer, museum to address  related to the management of Limited social support, ADL IADL  limitations, Inability to perform ADL's independently, and Inability to perform IADL's independently related to the management of CHF, HTN, DMII, Asthma, and Chronic Back Pain demonstrate a decrease in CHF, HTN, DMII, Asthma, and Chronic Pain Pain  exacerbations   will demonstrate ongoing self health care management ability    through collaboration with RN Care manager, provider, and care team.   Interventions: Inter-disciplinary care team collaboration (see longitudinal plan of care) Evaluation of current treatment plan related to  self management and patient's adherence to plan as established by provider  Pain Interventions:  New Goal Pain assessment performed Medications reviewed Reviewed provider established plan for pain management; Discussed importance of adherence to all scheduled medical appointments; Counseled on the importance of reporting any/all new or changed pain symptoms or management strategies to pain management provider; Advised patient to report to care team affect of pain on daily activities; Reviewed with patient prescribed pharmacological and nonpharmacological pain relief strategies; Screening for signs and symptoms of depression related to chronic disease state;  Assessed social determinant of health barriers;   Hypertension Interventions:  New Goal Last practice recorded BP readings:  BP Readings from Last 3 Encounters:  08/15/21 116/72  07/31/21 137/72  06/03/21 (!) 147/85  Most recent eGFR/CrCl:  Lab Results  Component Value Date   EGFR 75 08/22/2021    No components found for: CRCL  Evaluation of current treatment plan related to hypertension self management and patient's adherence to plan as established by provider; Reviewed medications with patient and discussed importance of compliance; Discussed plans with patient for ongoing care management follow up and provided patient with direct contact information for care management team; Advised patient,  providing education and rationale, to monitor blood pressure daily and record, calling PCP for findings outside established parameters;  Reviewed scheduled/upcoming provider appointments including:  Provided education on prescribed diet Low Sodium Heart Healthy;  Screening for signs and symptoms of depression related to chronic disease state;  Assessed social determinant of health barriers;   Diabetes Interventions: Assessed patient's understanding of A1c goal: <7% Provided education to patient about basic DM disease process; Reviewed medications with patient and discussed importance of medication adherence; Counseled on importance of regular laboratory monitoring as prescribed; Discussed plans with patient for ongoing care management follow up and provided patient with direct contact information for care management team; Reviewed scheduled/upcoming provider appointments including: Cardiology Appt on 11/05/21; Podiatry Appt on 12/02/21 and Bilateral cataract surgeries in 11/06/21 Right eye and 12/04/21 Left eye.; Advised patient, providing education and rationale, to check cbg 4 times daily and record, calling PCP for findings outside established parameters; Referral made to pharmacy team for assistance with complex medication regimen; Referral made to social work team for assistance with possible PCS services; Screening for signs and symptoms of depression related to chronic disease state;  Assessed social determinant of health barriers;  Lab Results  Component Value Date   HGBA1C 8.3 (H) 07/11/2021  Heart Failure Interventions: Basic overview and discussion of pathophysiology of Heart Failure reviewed; Provided education on low sodium diet; Assessed need for readable accurate scales in home; Provided education about placing scale on hard, flat surface; Advised patient to weigh each morning after emptying bladder; Discussed importance of daily weight and advised patient to weigh and  record daily; Reviewed role of diuretics in prevention of fluid overload and  management of heart failure; Discussed the importance of keeping all appointments with provider; Screening for signs and symptoms of depression related to chronic disease state;  Assessed social determinant of health barriers;  Asthma: (Status:New goal. Provided patient with basic written and verbal Asthma education on self care/management/and exacerbation prevention; Advised patient to track and manage Asthma triggers;  Provided instruction about proper use of medications used for management of Asthma including inhalers; Discussed the importance of adequate rest and management of fatigue with Asthma; Screening for signs and symptoms of depression related to chronic disease state;  Assessed social determinant of health barriers;   Patient Goals/Self-Care Activities: Patient will self administer medications as prescribed Patient will attend all scheduled provider appointments Patient will call pharmacy for medication refills Patient will call provider office for new concerns or questions Patient will work with BSW to address care coordination needs and will continue to work with the clinical team to address health care and disease management related needs.   Patient will work with pharmacist to address complex medication regimen.  Follow Up Plan:  Telephone follow up appointment with care management team member scheduled for:  11/19/2021 at 10:00 AM

## 2021-10-27 ENCOUNTER — Other Ambulatory Visit: Payer: Self-pay

## 2021-10-27 NOTE — Patient Instructions (Signed)
Visit Information  [name]  - as a part of your Medicaid benefit, you are eligible for care management and care coordination services at no cost or copay. I was unable to reach you by phone today but would be happy to help you with your health related needs. Please feel free to call me @ [phone].   A member of the Managed Medicaid care management team will reach out to you again over the next 7 days.   Gus Puma, BSW, [facility]  [employer] Risk Managed Medicaid Team  [phone]

## 2021-10-27 NOTE — Patient Outreach (Signed)
Care Coordination  10/27/2021  Lindsay [name] 1981-06-01 [id number]    Medicaid Managed Care   Unsuccessful Outreach Note  10/27/2021 Name: Lindsay [name] MRN: [id number] DOB: [birth year]  Referred by: Grayce Sessions, NP Reason for referral : High Risk Managed Medicaid (MM Social Work Unsuccessful [employer])   An unsuccessful telephone outreach was attempted today. The patient was referred to the case management team for assistance with care management and care coordination.   Follow Up Plan: The care management team will reach out to the patient again over the next 7 days.   SIGNATURE  Gus Puma, BSW, [facility]  [place]  High Risk Managed Medicaid Team  [phone]

## 2021-11-03 NOTE — Progress Notes (Signed)
Cardiology Office Note:    Date:  11/05/2021   ID:  Lindsay [name], DOB 07-30-1981, MRN [id number]  PCP:  [name], NP  Cardiologist:  Sinclair Grooms, MD   Referring MD: [name], NP   Chief Complaint  Patient presents with   Congestive Heart Failure   Coronary Artery Disease   Hypertension    History of Present Illness:    Lindsay [name] is a 40 y.o. female with a hx of diabetes mellitus type 2, essential hypertension, anemia of chronic disease, morbid obesity, obstructive sleep apnea, NSTEMI September 2017, with pk troponin 6.64 with cath and PCI with DES to mLAD and PTCA only to 2nd diag ostium, May 2021 re-cath revealed moderate atherosclerosis but no high-grade obstruction with patent LAD stent..  EF with cath 35-45% but Echo was 55-60%.  Recent hospital admission for respiratory failure and found to have a component of acute on chronic combined systolic and diastolic heart failure.   She is doing okay but comes in with a cane.  Sciatica is acting up.  Cataract surgery is scheduled for tomorrow in [place].  She is nervous about that.  She has had no acute dyspnea.  She has become more aware and better understands intake and output relative to lower extremity edema and pulmonary congestion.  She denies orthopnea.  No hospitalization.  She did run out of torsemide once and noted that fluid retention became a problem.  She was able to reverse that after she got back on torsemide.  She now understands that she should not let that occur and that she will need this medication going forward.  I discussed adding an SGLT2.  She had difficulty with Jardiance causing recurrent yeast infection.  Past Medical History:  Diagnosis Date   ARF (acute renal failure) ([place]) 04/2015   Asthma    Cellulitis of right upper extremity    Coronary artery disease    Diabetes mellitus    insulin dependent   Hyperlipidemia LDL goal <70    Hypertension    NSTEMI  (non-ST elevated myocardial infarction) ([place]) 08/2016   Obesity    S/P angioplasty with stent 08/2016   DES to mLAD and PTCA only to 2nd diag ostium.     Past Surgical History:  Procedure Laterality Date   CARDIAC CATHETERIZATION N/A 09/07/2016   Procedure: Left Heart Cath and Coronary Angiography;  Surgeon: [name], MD;  Location: [facility];  Service: Cardiovascular;  Laterality: N/A;   CARDIAC CATHETERIZATION N/A 09/07/2016   Procedure: Coronary Stent Intervention;  Surgeon: [name], MD;  Location: [place] [facility];  Service: Cardiovascular;  Laterality: N/A;   CARDIAC CATHETERIZATION N/A 09/07/2016   Procedure: Coronary Balloon Angioplasty;  Surgeon: [name], MD;  Location: [place];  Service: Cardiovascular;  Laterality: N/A;   CESAREAN SECTION     IRRIGATION AND DEBRIDEMENT SHOULDER Right 04/30/2015   Procedure: IRRIGATION AND DEBRIDEMENT SHOULDER;  Surgeon: Renette Butters, MD;  Location: [facility];  Service: Orthopedics;  Laterality: Right;   IRRIGATION AND DEBRIDEMENT SHOULDER Right 05/01/2015   LEFT HEART CATH AND CORONARY ANGIOGRAPHY N/A 04/25/2020   Procedure: LEFT HEART CATH AND CORONARY ANGIOGRAPHY;  Surgeon: [name], MD;  Location: [facility];  Service: Cardiovascular;  Laterality: N/A;   LEG SURGERY     SHOULDER ARTHROSCOPY Right 04/30/2015   Procedure: ARTHROSCOPY SHOULDER;  Surgeon: Renette Butters, MD;  Location: [facility];  Service: Orthopedics;  Laterality: Right;   TONSILLECTOMY      Current Medications: Current Meds  Medication Sig   albuterol (VENTOLIN HFA) 108 (90 Base) MCG/ACT inhaler Inhale 2 puffs into the lungs every 6 (six) hours as needed for wheezing or shortness of breath.   aspirin 81 MG tablet Take 1 tablet (81 mg total) by mouth daily.   atorvastatin (LIPITOR) 20 MG tablet Take 1 tablet (20 mg total) by mouth daily.   baclofen (LIORESAL) 10 MG tablet Take 0.5-1 tablets (5-10 mg total) by mouth 3 (three) times  daily as needed for muscle spasms.   BD PEN NEEDLE NANO 2ND GEN 32G X 4 MM MISC    BD VEO INSULIN SYRINGE U/F 31G X 15/64" 1 ML MISC USE  SYRINGE ONCE DAILY   Blood Glucose Monitoring Suppl (ACCU-CHEK AVIVA PLUS) w/Device KIT 1 each by Does not apply route as directed.   Continuous Blood Gluc Transmit (DEXCOM G6 TRANSMITTER) MISC Inject into the skin.   diclofenac Sodium (VOLTAREN) 1 % GEL Apply topically as needed.   diphenhydrAMINE (BENADRYL) 25 MG tablet Take 50 mg by mouth in the morning and at bedtime. Allergies   DULoxetine (CYMBALTA) 30 MG capsule Take 1 capsule (30 mg total) by mouth daily.   fluticasone (FLOVENT HFA) 110 MCG/ACT inhaler Inhale 2 puffs into the lungs 2 (two) times daily.   gabapentin (NEURONTIN) 300 MG capsule Take 2 capsules (600 mg total) by mouth 3 (three) times daily.   gabapentin (NEURONTIN) 300 MG capsule Take by mouth.   glucose blood (ACCU-CHEK AVIVA PLUS) test strip Use as instructed to test blood sugar 4 times daily.   HUMULIN R U-500 KWIKPEN 500 UNIT/ML kwikpen Inject 75-80 Units into the skin 3 (three) times daily with meals. AM 80 units  Noon 75 units  Evening 75 units If BG above 200 add 5 units to prescribed dose   hydrALAZINE (APRESOLINE) 100 MG tablet Take by mouth.   hydrochlorothiazide (HYDRODIURIL) 25 MG tablet Take 25 mg by mouth 3 (three) times daily.   Insulin Pen Needle (B-D UF III MINI PEN NEEDLES) 31G X 5 MM MISC Use as instructed. Monitor blood glucose levels three times per day   isosorbide mononitrate (IMDUR) 60 MG 24 hr tablet Take 1 tablet (60 mg total) by mouth daily.   ketorolac (ACULAR) 0.5 % ophthalmic solution    liraglutide (VICTOZA) 18 MG/3ML SOPN Inject 1.8 mg into the skin at bedtime.    losartan (COZAAR) 100 MG tablet Take by mouth.   Melatonin 10 MG TABS Take 3 tablets by mouth at bedtime.   metoprolol tartrate (LOPRESSOR) 50 MG tablet Take 1 tablet (50 mg total) by mouth 2 (two) times daily.   moxifloxacin (VIGAMOX) 0.5 %  ophthalmic solution    Multiple Vitamins-Minerals (MULTIVITAMIN PO) Take 1 tablet by mouth daily.   nitroGLYCERIN (NITROSTAT) 0.4 MG SL tablet Place 1 tablet (0.4 mg total) under the tongue every 5 (five) minutes as needed for chest pain. Please keep upcoming appt in October 2022. Final Attempt   ofloxacin (OCUFLOX) 0.3 % ophthalmic solution Place 1 drop into the left eye 4 (four) times daily.   potassium chloride SA (KLOR-CON) 20 MEQ tablet Take 1 tablet (20 mEq total) by mouth daily.   prednisoLONE acetate (PRED FORTE) 1 % ophthalmic suspension Place 1 drop into the left eye 4 (four) times daily.   sodium chloride (OCEAN) 0.65 % nasal spray Place into the nose as needed.   torsemide (DEMADEX) 20  MG tablet Take by mouth 2 (two) times daily. Dose Change: Take 2 tabs (40 mg total) twice a day.     Allergies:   Acetaminophen, Hydrazine yellow [tartrazine], and Lisinopril   Social History   Socioeconomic History   Marital status: Single    Spouse name: Not on file   Number of children: Not on file   Years of education: Not on file   Highest education level: Not on file  Occupational History   Not on file  Tobacco Use   Smoking status: Former    Packs/day: 0.25    Years: 2.00    Pack years: 0.50    Types: Cigarettes   Smokeless tobacco: Never  Vaping Use   Vaping Use: Never used  Substance and Sexual Activity   Alcohol use: Not Currently    Comment: socially   Drug use: No   Sexual activity: Not on file  Other Topics Concern   Not on file  Social History Narrative   Not on file   Social Determinants of Health   Financial Resource Strain: Low Risk    Difficulty of Paying Living Expenses: Not hard at all  Food Insecurity: No Food Insecurity   Worried About Charity fundraiser in the Last Year: Never true   [place] in the Last Year: Never true  Transportation Needs: No Transportation Needs   Lack of Transportation (Medical): No   Lack of Transportation  (Non-Medical): No  Physical Activity: Insufficiently Active   Days of Exercise per Week: 3 days   Minutes of Exercise per Session: 20 min  Stress: Stress Concern Present   Feeling of Stress : To some extent  Social Connections: Unknown   Frequency of Communication with Friends and Family: More than three times a week   Frequency of Social Gatherings with Friends and Family: More than three times a week   Attends Religious Services: More than 4 times per year   Active Member of [employer] or Organizations: Yes   Attends Archivist Meetings: 1 to 4 times per year   Marital Status: Not on file     Family History: The patient's family history includes Cancer in her maternal grandmother; Diabetes in her father and mother; Heart disease in her father and mother; Hypertension in her mother; Stroke in her maternal grandmother.  ROS:   Please see the history of present illness.    No chills or fever.  No chest pain.  No edema.  All other systems reviewed and are negative.  EKGs/Labs/Other Studies Reviewed:    The following studies were reviewed today: No new imaging data.  EKG:  EKG not repeated  Recent Labs: 07/17/2021: ALT 29; B Natriuretic Peptide 190.7 07/30/2021: Hemoglobin 8.7; Platelets 259 07/31/2021: Magnesium 1.8 08/22/2021: BUN 23; Creatinine, Ser 0.98; NT-Pro BNP 113; Potassium 4.2; Sodium 138  Recent Lipid Panel    Component Value Date/Time   CHOL 114 07/23/2021 0559   CHOL 182 12/05/2020 0928   TRIG 62 07/23/2021 0559   HDL 34 (L) 07/23/2021 0559   HDL 45 12/05/2020 0928   CHOLHDL 3.4 07/23/2021 0559   VLDL 12 07/23/2021 0559   LDLCALC 68 07/23/2021 0559   LDLCALC 97 12/05/2020 0928    Physical Exam:    VS:  BP 132/80   Pulse 80   Ht 5' 7.5" (1.715 m)   Wt (!) 420 lb 9.6 oz (190.8 kg)   SpO2 96%   BMI 64.90 kg/m  Wt Readings from Last 3 Encounters:  11/05/21 (!) 420 lb 9.6 oz (190.8 kg)  08/15/21 (!) 425 lb 9.6 oz (193.1 kg)  07/31/21 (!) 415 lb 8  oz (188.5 kg)     GEN: Morbid obesity. No acute distress HEENT: Normal NECK: No JVD. LYMPHATICS: No lymphadenopathy CARDIAC: No murmur. RRR no gallop, or edema. VASCULAR:  Normal Pulses. No bruits. RESPIRATORY:  Clear to auscultation without rales, wheezing or rhonchi  ABDOMEN: Soft, non-tender, non-distended, No pulsatile mass, MUSCULOSKELETAL: No deformity  SKIN: Warm and dry NEUROLOGIC:  Alert and oriented x 3 PSYCHIATRIC:  Normal affect   ASSESSMENT:    1. Coronary artery disease involving native coronary artery of native heart with angina pectoris ([place])   2. Acute on chronic diastolic heart failure ([place])   3. Morbid obesity ([place])   4. OSA (obstructive sleep apnea)    PLAN:    In order of problems listed above:  Stable without angina Very volume sensitive.  Stable as long she takes torsemide as recommended.  Would love to try SGLT2 but she is not willing given the difficulty she had with Jardiance.  The impact of Farxiga relative to this side effect would be likely the same. Still greater than 400 pounds.  This is a terrible morbidity for her. She is now on BiPAP.  She sounds as though she is relatively compliant.   Medication Adjustments/Labs and Tests Ordered: Current medicines are reviewed at length with the patient today.  Concerns regarding medicines are outlined above.  No orders of the defined types were placed in this encounter.  No orders of the defined types were placed in this encounter.   There are no Patient Instructions on file for this visit.   Signed, Sinclair Grooms, MD  11/05/2021 1:59 PM    [facility]

## 2021-11-05 ENCOUNTER — Other Ambulatory Visit: Payer: Self-pay

## 2021-11-05 ENCOUNTER — Encounter: Payer: Self-pay | Admitting: Interventional Cardiology

## 2021-11-05 ENCOUNTER — Ambulatory Visit: Payer: Medicaid Other | Admitting: Interventional Cardiology

## 2021-11-05 VITALS — BP 132/80 | HR 80 | Ht 67.5 in | Wt >= 6400 oz

## 2021-11-05 DIAGNOSIS — G4733 Obstructive sleep apnea (adult) (pediatric): Secondary | ICD-10-CM | POA: Diagnosis not present

## 2021-11-05 DIAGNOSIS — I5033 Acute on chronic diastolic (congestive) heart failure: Secondary | ICD-10-CM | POA: Diagnosis not present

## 2021-11-05 DIAGNOSIS — I25119 Atherosclerotic heart disease of native coronary artery with unspecified angina pectoris: Secondary | ICD-10-CM | POA: Diagnosis not present

## 2021-11-05 NOTE — Patient Outreach (Signed)
Medicaid Managed Care Social Work Note  11/05/2021 Name:  Lindsay [name] MRN:  [id number] DOB:  Jan 01, 1981  Lindsay [name] is an 40 y.o. year old female who is [name] primary [name] of Lindsay Perna, NP.  The [facility] Managed Care Coordination team was consulted for assistance with:  Level of Care Concerns  Lindsay [name] was given information about Medicaid Managed Care Coordination team services today. Lindsay [name] agreed to services and verbal consent obtained.  Engaged with [name]  for by telephone forinitial visit in response to referral for case management and/or care coordination services.   Assessments/Interventions:  Review of past medical history, allergies, medications, health status, including review of consultants reports, laboratory and other test data, was performed as part of comprehensive evaluation and provision of chronic care management services.  SDOH: (Social Determinant of Health) assessments and interProvided [name] with information about the process of obtaining PCS. BSW informed [name] healthy blue would have to complete an assessment to determine if she would qualify for PCS.ventions performed:    Advanced Directives Status:  Not addressed in this encounter.  Care Plan                 Allergies  Allergen Reactions   Acetaminophen Itching and Swelling    Itching of the mouth, swelling of tongue and stomach started hurting mild   Hydrazine Yellow [Tartrazine] Shortness Of Breath and Swelling    Swelling mostly noticed in legs and feet, retaining urination, shortness of breaht, and minor chest pain   Lisinopril Shortness Of Breath    Was on prinzide; had sob/chest pain on it.    Medications Reviewed Today     Reviewed by Lindsay Rise, RN (Case Manager) on 10/22/21 at 64  Med List Status: <None>   Medication Order Taking? Sig Documenting Provider Last Dose Status Informant  Accu-Chek FastClix Lancets MISC [id number] Yes USE 1  TO CHECK GLUCOSE 4 TIMES DAILY [name], [name], MD Taking Active Self  albuterol (VENTOLIN HFA) 108 (90 Base) MCG/ACT inhaler [id number] Yes Inhale 2 puffs into the lungs every 6 (six) hours as needed for wheezing or shortness of breath. Lindsay Perna, NP Taking Active   aspirin 81 MG chewable tablet [id number] No Chew by mouth.  [name] not taking: Reported on 10/22/2021   [provider] Not Taking Active            Med Note [facility], Lindsay [name]   Wed Oct 22, 2021 11:09 AM) Taking PRN in case of emergencies  aspirin 81 MG tablet [id number] Yes Take 1 tablet (81 mg total) by mouth daily. Lindsay Demark, PA-C Taking Active Self  atorvastatin (LIPITOR) 20 MG tablet [id number] Yes Take 1 tablet (20 mg total) by mouth daily. Lindsay Perna, NP Taking Active   atorvastatin (LIPITOR) 80 MG tablet [id number] No Take by mouth.  [name] not taking: Reported on 10/22/2021   [provider] Not Taking Active   baclofen (LIORESAL) 10 MG tablet [id number] Yes Take 0.5-1 tablets (5-10 mg total) by mouth 3 (three) times daily as needed for muscle spasms. Hilts, Legrand Como, MD Taking Active   BD PEN NEEDLE NANO 2ND GEN 32G X 4 MM MISC [id number] Yes  [provider] Taking Active Self  BD VEO INSULIN SYRINGE U/F 31G X 15/64" 1 ML MISC [id number] Yes USE  SYRINGE ONCE DAILY Lindsay Snare, MD Taking Active Self  Blood Glucose Monitoring Suppl (ACCU-CHEK AVIVA PLUS) w/Device KIT [id number] Yes 1 each by Does  not apply route as directed. Lindsay Snare, MD Taking Active   Continuous Blood Gluc Receiver ([place]) Kerrin Mo [id number]  Dispense and use as directed [provider]  Active   Continuous Blood Gluc Sensor ([place]) MISC [id number]  Inject into the skin. [provider]  Active   Continuous Blood Gluc Transmit (DEXCOM G6 TRANSMITTER) MISC [id number]  Inject into the skin. [provider]  Active   diphenhydrAMINE (BENADRYL) 25 MG tablet  [id number] No Take 50 mg by mouth in the morning and at bedtime. Allergies  [name] not taking: Reported on 10/22/2021   [provider] Not Taking Active Self           Med Note [facility], MAUREEN [name]   Wed Oct 22, 2021 11:10 AM) Taking PRN for Allergies  DULoxetine (CYMBALTA) 30 MG capsule [id number] Yes Take 1 capsule (30 mg total) by mouth daily. Lindsay Perna, NP Taking Active   fluticasone (FLOVENT HFA) 110 MCG/ACT inhaler [id number] Yes Inhale 2 puffs into the lungs 2 (two) times daily. Lindsay Perna, NP Taking Active   gabapentin (NEURONTIN) 300 MG capsule [id number] Yes Take 2 capsules (600 mg total) by mouth 3 (three) times daily. Lindsay Perna, NP Taking Active   gabapentin (NEURONTIN) 300 MG capsule [id number] No Take by mouth.  [name] not taking: Reported on 10/22/2021   [provider] Not Taking Active   glucose blood (ACCU-CHEK AVIVA PLUS) test strip [id number]  Use as instructed to test blood sugar 4 times daily. Lindsay Pounds, NP  Active Self  HUMULIN R U-500 KWIKPEN 500 UNIT/ML Lindsay [name] [id number] Yes Inject 75-80 Units into the skin 3 (three) times daily with meals. AM 80 units  Noon 75 units  Evening 75 units If BG above 200 add 5 units to prescribed dose [provider] Taking Active Self           Med Note [name], ERICIA [name]   Fri Jul 11, 2021  4:07 AM) 1 dose as of 07/21, took 25 units due to not eating   hydrALAZINE (APRESOLINE) 100 MG tablet [id number] Yes Take 1 tablet (100 mg total) by mouth 3 (three) times daily. Lindsay, [name], MD Taking Active   hydrALAZINE (APRESOLINE) 100 MG tablet [id number] No Take by mouth.  [name] not taking: Reported on 10/22/2021   [provider] Not Taking Active   Insulin Pen Needle (B-D UF III MINI PEN NEEDLES) 31G X 5 MM MISC [id number]  Use as instructed. Monitor blood glucose levels three times per day Lindsay Perna, NP  Active Self  isosorbide mononitrate (IMDUR) 30 MG 24 hr  tablet [id number] No TAKE 1 TABLET BY MOUTH DAILY  [name] not taking: Reported on 10/22/2021   Lindsay Crome, MD Not Taking Active   isosorbide mononitrate (IMDUR) 60 MG 24 hr tablet [id number] Yes Take 1 tablet (60 mg total) by mouth daily. Lindsay, [name], MD Taking Active   ketorolac (ACULAR) 0.5 % ophthalmic solution [id number] No Insert one drop 4 times [name] day into surgical eye starting 4 days prior to surgery  [name] not taking: Reported on 10/22/2021   [provider] Not Taking Active   liraglutide (VICTOZA) 18 MG/3ML SOPN [id number] Yes Inject 1.8 mg into the skin at bedtime.  [provider] Taking Active Self           Med Note Chanetta Marshall Apr 01, 2020  9:48 AM)    losartan (COZAAR)  100 MG tablet [id number] Yes Take by mouth. [provider] Taking Active   Melatonin 10 MG TABS [id number] No Take 3 tablets by mouth at bedtime.  [name] not taking: Reported on 10/22/2021   [provider] Not Taking Active Self           Med Note [facility], Lindsay [name]   Wed Oct 22, 2021 11:14 AM) Taking PRN  metoprolol tartrate (LOPRESSOR) 50 MG tablet [id number] Yes Take 1 tablet (50 mg total) by mouth 2 (two) times daily. Lindsay, [name], MD Taking Active   moxifloxacin (VIGAMOX) 0.5 % ophthalmic solution [id number] No Insert one drop into the surgical eye 3 times [name] day starting 4 days prior to surgery.  [name] not taking: Reported on 10/22/2021   [provider] Not Taking Active   Multiple Vitamins-Minerals (MULTIVITAMIN PO) [id number] Yes Take 1 tablet by mouth daily. [provider] Taking Active Self  nitroGLYCERIN (NITROSTAT) 0.4 MG SL tablet [id number] Yes Place 1 tablet (0.4 mg total) under the tongue every 5 (five) minutes as needed for chest pain. Please keep upcoming appt in October 2022. Final Attempt Lindsay Crome, MD Taking Active Self  ofloxacin (OCUFLOX) 0.3 % ophthalmic solution [id number] No Place 1 drop into the  left eye 4 (four) times daily.  [name] not taking: Reported on 10/22/2021   [provider] Not Taking Active   potassium chloride SA (KLOR-CON) 20 MEQ tablet [id number] Yes Take 1 tablet (20 mEq total) by mouth daily. Lindsay Crome, MD Taking Active   prednisoLONE acetate (PRED FORTE) 1 % ophthalmic suspension [id number] No Place 1 drop into the left eye 4 (four) times daily.  [name] not taking: Reported on 10/22/2021   [provider] Not Taking Active   torsemide (DEMADEX) 20 MG tablet [id number]  Take by mouth. [provider]  Active             [name] Active Problem List   Diagnosis Date Noted   Acute respiratory failure with hypoxia ([place])    Acute on chronic diastolic heart failure (HCC)    CAP (community acquired pneumonia) 07/11/2021   Elevated liver enzymes 05/23/2020   Spondylosis of lumbar spine 06/22/2019   Pedal edema 06/22/2019   Hyperlipidemia 06/22/2019   Normocytic anemia 06/22/2019   Vitamin D deficiency 06/22/2019   OSA (obstructive sleep apnea) 11/03/2016   Coronary artery disease involving native coronary artery of native heart with angina pectoris ([place]) 09/11/2016   Diabetes mellitus with complication (HCC)    Chest pain of uncertain etiology 82/64/1583   Community acquired pneumonia 09/07/2016   MI, old    DM neuropathy, type II diabetes mellitus ([place]) 06/10/2016   Morbid obesity ([place]) 06/10/2016   Pressure ulcer 05/17/2015   Anaerobic abscess ([place])    ARF (acute renal failure) (HCC)    Septic arthritis ([place])    Essential hypertension 04/30/2015   Cellulitis 04/30/2015   Abscess of right shoulder     Conditions to be addressed/monitored per PCP order:   PCS   There are no care plans that you recently modified to display for this [name].   Follow up:  [name] agrees to Care Plan and Follow-up.  Plan: The Managed Medicaid care management team will reach out to the [name] again over the next 30 days.  Date/time of  next scheduled Social Work care management/care coordination outreach:  12/05/21  Mickel Fuchs, [place], [place] Medicaid Team  [phone])  [phone]

## 2021-11-05 NOTE — Patient Instructions (Signed)
Medication Instructions:  Your physician recommends that you continue on your current medications as directed. Please refer to the Current Medication list given to you today.  *If you need a refill on your cardiac medications before your next appointment, please call your pharmacy*    Follow-Up: At [facility] you and your health needs are our priority.  As part of our continuing mission to provide you with exceptional heart care, we have created designated Provider Care Teams.  These Care Teams include your primary Cardiologist (physician) and Advanced Practice Providers (APPs -  Physician Assistants and Nurse Practitioners) who all work together to provide you with the care you need, when you need it.  Your next appointment:   6 month(s)  The format for your next appointment:   In Person  Provider:   Lesleigh Noe, MD

## 2021-11-05 NOTE — Patient Instructions (Signed)
Visit Information  Ms. Whitelaw was given information about Medicaid Managed Care team care coordination services as a part of their Healthy [facility] Medicaid benefit. Dylann D Harts verbally consented to engagement with the [facility] Managed Care team.   If you are experiencing a medical emergency, please call 911 or report to your local emergency department or urgent care.   If you have a non-emergency medical problem during routine business hours, please contact your provider's office and ask to speak with a nurse.   For questions related to your Healthy [facility] health plan, please call: [phone] or visit the homepage here: [email or web address]  If you would like to schedule transportation through your Healthy [facility] plan, please call the following number at least 2 days in advance of your appointment: [phone]  Call the [facility] Crisis Line at [phone], at any time, 24 hours a day, 7 days a week. If you are in danger or need immediate medical attention call 911.  If you would like help to quit smoking, call 1-800-QUIT-NOW (661 020 9192) OR Espaol: 1-855-Djelo-Ya (3-295-188-4166) o para ms informacin haga clic aqu or Text READY to 063-016 to register via text  Ms. Aikens - following are the goals we discussed in your visit today:   Goals Addressed   None      Social Worker will follow up in 30 days.   Gus Puma, BSW, [place] [facility]  [place]  High Risk Managed Medicaid Team  [phone]   Following is a copy of your plan of care:  There are no care plans that you recently modified to display for this patient.

## 2021-11-06 DIAGNOSIS — G4733 Obstructive sleep apnea (adult) (pediatric): Secondary | ICD-10-CM | POA: Diagnosis not present

## 2021-11-06 DIAGNOSIS — H25812 Combined forms of age-related cataract, left eye: Secondary | ICD-10-CM | POA: Diagnosis not present

## 2021-11-06 DIAGNOSIS — Z6841 Body Mass Index (BMI) 40.0 and over, adult: Secondary | ICD-10-CM | POA: Diagnosis not present

## 2021-11-06 DIAGNOSIS — I10 Essential (primary) hypertension: Secondary | ICD-10-CM | POA: Diagnosis not present

## 2021-11-08 DIAGNOSIS — J9601 Acute respiratory failure with hypoxia: Secondary | ICD-10-CM | POA: Diagnosis not present

## 2021-11-08 DIAGNOSIS — E662 Morbid (severe) obesity with alveolar hypoventilation: Secondary | ICD-10-CM | POA: Diagnosis not present

## 2021-11-08 DIAGNOSIS — I5033 Acute on chronic diastolic (congestive) heart failure: Secondary | ICD-10-CM | POA: Diagnosis not present

## 2021-11-10 ENCOUNTER — Ambulatory Visit: Payer: Medicaid Other

## 2021-11-10 ENCOUNTER — Telehealth: Payer: Self-pay | Admitting: Pharmacist

## 2021-11-10 ENCOUNTER — Ambulatory Visit: Payer: Self-pay

## 2021-11-10 NOTE — Patient Outreach (Signed)
11/10/2021 Name: Lindsay [name] MRN: [id number] DOB: [birth year]  Referred by: Grayce Sessions, NP Reason for referral : No chief complaint on file.   An unsuccessful telephone outreach was attempted today. The patient was referred to the case management team for assistance with care management and care coordination.    Follow Up Plan: A HIPAA compliant phone message was left for the patient providing contact information and requesting a return call. and The Managed Medicaid care management team will reach out to the patient again over the next 10 days.   Cheral Almas PharmD, CPP High Risk Managed Medicaid [place] [phone]

## 2021-11-18 ENCOUNTER — Other Ambulatory Visit: Payer: Self-pay | Admitting: Interventional Cardiology

## 2021-11-18 ENCOUNTER — Other Ambulatory Visit (INDEPENDENT_AMBULATORY_CARE_PROVIDER_SITE_OTHER): Payer: Self-pay | Admitting: Primary Care

## 2021-11-19 ENCOUNTER — Other Ambulatory Visit: Payer: Self-pay

## 2021-11-19 NOTE — Patient Outreach (Signed)
Care Coordination  11/19/2021  Lindsay [name] [birth year] [id number]  11/19/2021 Name: Lindsay [name] MRN: [id number] DOB: [birth year]  Referred by: Grayce Sessions, NP Reason for referral : High Risk Managed Medicaid (Unsuccessful Telephone Outreach)   An unsuccessful telephone outreach was attempted today. The patient was referred to the case management team for assistance with care management and care coordination.    Follow Up Plan: A HIPAA compliant phone message was left for the patient providing contact information and requesting a return call.  The Managed Medicaid care management team will reach out to the patient again over the next 14 days.    Virgina Norfolk RN, BSN Community Care Coordinator [facility]  [facility] Mobile: [phone]

## 2021-11-19 NOTE — Telephone Encounter (Signed)
Sent to PCP ?

## 2021-11-19 NOTE — Patient Instructions (Signed)
Lindsay [name] ,   The [facility] Managed Care Team is available to provide assistance to you with your healthcare needs at no cost and as a benefit of your [facility] Health plan. I'm sorry I was unable to reach you today for our scheduled appointment. Our care guide will call you to reschedule our telephone appointment. Please call me at the number below. I am available to be of assistance to you regarding your healthcare needs. .   Thank you,   Virgina Norfolk RN, BSN [facility] Coordinator [facility]  [facility] Mobile: [phone]

## 2021-11-26 ENCOUNTER — Other Ambulatory Visit: Payer: Self-pay

## 2021-11-26 ENCOUNTER — Other Ambulatory Visit (INDEPENDENT_AMBULATORY_CARE_PROVIDER_SITE_OTHER): Payer: Self-pay | Admitting: Primary Care

## 2021-11-26 NOTE — Patient Outreach (Signed)
Medicaid Managed Care   Nurse Care Manager Note  11/26/2021 Name:  Lindsay [name] MRN:  [id number] DOB:  12-11-1981  Lindsay [name] is an 40 y.o. year old female who is a primary patient of [name], NP.  The [facility] Managed Care Coordination team was consulted for assistance with:    CHF HTN DMII Asthma Chronic Back Pain   Lindsay [name] was given information about Medicaid Managed Care Coordination team services today. [name] agreed to services and verbal consent obtained.  Engaged with patient by telephone for follow up visit in response to provider referral for case management and/or care coordination services.   Assessments/Interventions:  Review of past medical history, allergies, medications, health status, including review of consultants reports, laboratory and other test data, was performed as part of comprehensive evaluation and provision of chronic care management services.  SDOH (Social Determinants of Health) assessments and interventions performed:   Care Plan  Allergies  Allergen Reactions   Acetaminophen Itching and Swelling    Itching of the mouth, swelling of tongue and stomach started hurting mild   Hydrazine Yellow [Tartrazine] Shortness Of Breath and Swelling    Swelling mostly noticed in legs and feet, retaining urination, shortness of breaht, and minor chest pain   Lisinopril Shortness Of Breath    Was on prinzide; had sob/chest pain on it.    Medications Reviewed Today     Reviewed by [name], RN (Case Manager) on 11/26/21 at 1127  Med List Status: <None>   Medication Order Taking? Sig Documenting Provider Last Dose Status Informant  Accu-Chek FastClix Lancets MISC [id number] No USE 1 TO CHECK GLUCOSE 4 TIMES DAILY  Patient not taking: Reported on 11/05/2021   Charlott Rakes, MD Not Taking Active   albuterol (VENTOLIN HFA) 108 (90 Base) MCG/ACT inhaler [id number] No Inhale 2 puffs into the lungs every 6  (six) hours as needed for wheezing or shortness of breath. [name], NP Taking Active   aspirin 81 MG tablet [id number] No Take 1 tablet (81 mg total) by mouth daily. Clent Demark, PA-C Taking Active Self  atorvastatin (LIPITOR) 20 MG tablet [id number] No Take 1 tablet (20 mg total) by mouth daily. [name], NP Taking Active   atorvastatin (LIPITOR) 80 MG tablet [id number] No Take by mouth.  Patient not taking: Reported on 11/05/2021   [provider] Not Taking Active            Med Note [facility], Gwenette Greet A   Wed Oct 22, 2021 12:15 PM) Changed to 20 mg tablet.  baclofen (LIORESAL) 10 MG tablet [id number] No Take 0.5-1 tablets (5-10 mg total) by mouth 3 (three) times daily as needed for muscle spasms. Hilts, Legrand Como, MD Taking Active   BD PEN NEEDLE NANO 2ND GEN 32G X 4 MM MISC [id number] No  [provider] Taking Active Self  BD VEO INSULIN SYRINGE U/F 31G X 15/64" 1 ML MISC [id number] No USE  SYRINGE ONCE DAILY Elayne Snare, MD Taking Active Self  Blood Glucose Monitoring Suppl (ACCU-CHEK AVIVA PLUS) w/Device KIT [id number] No 1 each by Does not apply route as directed. Elayne Snare, MD Taking Active   Continuous Blood Gluc Transmit (DEXCOM G6 TRANSMITTER) MISC [id number] No Inject into the skin. [provider] Taking Active   diclofenac Sodium (VOLTAREN) 1 % GEL [id number] No Apply topically as needed. [provider] Taking Active   diphenhydrAMINE (BENADRYL) 25 MG tablet [id number] No Take 50 mg  Medicaid Managed Care   Nurse Care Manager Note  11/26/2021 Name:  Lindsay [name] MRN:  [id number] DOB:  12-11-1981  Lindsay [name] is an 40 y.o. year old female who is a primary patient of [name], NP.  The [facility] Managed Care Coordination team was consulted for assistance with:    CHF HTN DMII Asthma Chronic Back Pain   Lindsay [name] was given information about Medicaid Managed Care Coordination team services today. [name] agreed to services and verbal consent obtained.  Engaged with patient by telephone for follow up visit in response to provider referral for case management and/or care coordination services.   Assessments/Interventions:  Review of past medical history, allergies, medications, health status, including review of consultants reports, laboratory and other test data, was performed as part of comprehensive evaluation and provision of chronic care management services.  SDOH (Social Determinants of Health) assessments and interventions performed:   Care Plan  Allergies  Allergen Reactions   Acetaminophen Itching and Swelling    Itching of the mouth, swelling of tongue and stomach started hurting mild   Hydrazine Yellow [Tartrazine] Shortness Of Breath and Swelling    Swelling mostly noticed in legs and feet, retaining urination, shortness of breaht, and minor chest pain   Lisinopril Shortness Of Breath    Was on prinzide; had sob/chest pain on it.    Medications Reviewed Today     Reviewed by [name], RN (Case Manager) on 11/26/21 at 1127  Med List Status: <None>   Medication Order Taking? Sig Documenting Provider Last Dose Status Informant  Accu-Chek FastClix Lancets MISC [id number] No USE 1 TO CHECK GLUCOSE 4 TIMES DAILY  Patient not taking: Reported on 11/05/2021   Charlott Rakes, MD Not Taking Active   albuterol (VENTOLIN HFA) 108 (90 Base) MCG/ACT inhaler [id number] No Inhale 2 puffs into the lungs every 6  (six) hours as needed for wheezing or shortness of breath. [name], NP Taking Active   aspirin 81 MG tablet [id number] No Take 1 tablet (81 mg total) by mouth daily. Clent Demark, PA-C Taking Active Self  atorvastatin (LIPITOR) 20 MG tablet [id number] No Take 1 tablet (20 mg total) by mouth daily. [name], NP Taking Active   atorvastatin (LIPITOR) 80 MG tablet [id number] No Take by mouth.  Patient not taking: Reported on 11/05/2021   [provider] Not Taking Active            Med Note [facility], Gwenette Greet A   Wed Oct 22, 2021 12:15 PM) Changed to 20 mg tablet.  baclofen (LIORESAL) 10 MG tablet [id number] No Take 0.5-1 tablets (5-10 mg total) by mouth 3 (three) times daily as needed for muscle spasms. Hilts, Legrand Como, MD Taking Active   BD PEN NEEDLE NANO 2ND GEN 32G X 4 MM MISC [id number] No  [provider] Taking Active Self  BD VEO INSULIN SYRINGE U/F 31G X 15/64" 1 ML MISC [id number] No USE  SYRINGE ONCE DAILY Elayne Snare, MD Taking Active Self  Blood Glucose Monitoring Suppl (ACCU-CHEK AVIVA PLUS) w/Device KIT [id number] No 1 each by Does not apply route as directed. Elayne Snare, MD Taking Active   Continuous Blood Gluc Transmit (DEXCOM G6 TRANSMITTER) MISC [id number] No Inject into the skin. [provider] Taking Active   diclofenac Sodium (VOLTAREN) 1 % GEL [id number] No Apply topically as needed. [provider] Taking Active   diphenhydrAMINE (BENADRYL) 25 MG tablet [id number] No Take 50 mg  Medicaid Managed Care   Nurse Care Manager Note  11/26/2021 Name:  Lindsay [name] MRN:  [id number] DOB:  12-11-1981  Lindsay [name] is an 40 y.o. year old female who is a primary patient of [name], NP.  The [facility] Managed Care Coordination team was consulted for assistance with:    CHF HTN DMII Asthma Chronic Back Pain   Lindsay [name] was given information about Medicaid Managed Care Coordination team services today. [name] agreed to services and verbal consent obtained.  Engaged with patient by telephone for follow up visit in response to provider referral for case management and/or care coordination services.   Assessments/Interventions:  Review of past medical history, allergies, medications, health status, including review of consultants reports, laboratory and other test data, was performed as part of comprehensive evaluation and provision of chronic care management services.  SDOH (Social Determinants of Health) assessments and interventions performed:   Care Plan  Allergies  Allergen Reactions   Acetaminophen Itching and Swelling    Itching of the mouth, swelling of tongue and stomach started hurting mild   Hydrazine Yellow [Tartrazine] Shortness Of Breath and Swelling    Swelling mostly noticed in legs and feet, retaining urination, shortness of breaht, and minor chest pain   Lisinopril Shortness Of Breath    Was on prinzide; had sob/chest pain on it.    Medications Reviewed Today     Reviewed by [name], RN (Case Manager) on 11/26/21 at 1127  Med List Status: <None>   Medication Order Taking? Sig Documenting Provider Last Dose Status Informant  Accu-Chek FastClix Lancets MISC [id number] No USE 1 TO CHECK GLUCOSE 4 TIMES DAILY  Patient not taking: Reported on 11/05/2021   Charlott Rakes, MD Not Taking Active   albuterol (VENTOLIN HFA) 108 (90 Base) MCG/ACT inhaler [id number] No Inhale 2 puffs into the lungs every 6  (six) hours as needed for wheezing or shortness of breath. [name], NP Taking Active   aspirin 81 MG tablet [id number] No Take 1 tablet (81 mg total) by mouth daily. Clent Demark, PA-C Taking Active Self  atorvastatin (LIPITOR) 20 MG tablet [id number] No Take 1 tablet (20 mg total) by mouth daily. [name], NP Taking Active   atorvastatin (LIPITOR) 80 MG tablet [id number] No Take by mouth.  Patient not taking: Reported on 11/05/2021   [provider] Not Taking Active            Med Note [facility], Gwenette Greet A   Wed Oct 22, 2021 12:15 PM) Changed to 20 mg tablet.  baclofen (LIORESAL) 10 MG tablet [id number] No Take 0.5-1 tablets (5-10 mg total) by mouth 3 (three) times daily as needed for muscle spasms. Hilts, Legrand Como, MD Taking Active   BD PEN NEEDLE NANO 2ND GEN 32G X 4 MM MISC [id number] No  [provider] Taking Active Self  BD VEO INSULIN SYRINGE U/F 31G X 15/64" 1 ML MISC [id number] No USE  SYRINGE ONCE DAILY Elayne Snare, MD Taking Active Self  Blood Glucose Monitoring Suppl (ACCU-CHEK AVIVA PLUS) w/Device KIT [id number] No 1 each by Does not apply route as directed. Elayne Snare, MD Taking Active   Continuous Blood Gluc Transmit (DEXCOM G6 TRANSMITTER) MISC [id number] No Inject into the skin. [provider] Taking Active   diclofenac Sodium (VOLTAREN) 1 % GEL [id number] No Apply topically as needed. [provider] Taking Active   diphenhydrAMINE (BENADRYL) 25 MG tablet [id number] No Take 50 mg  Medicaid Managed Care   Nurse Care Manager Note  11/26/2021 Name:  Lindsay [name] MRN:  [id number] DOB:  12-11-1981  Lindsay [name] is an 40 y.o. year old female who is a primary patient of [name], NP.  The [facility] Managed Care Coordination team was consulted for assistance with:    CHF HTN DMII Asthma Chronic Back Pain   Lindsay [name] was given information about Medicaid Managed Care Coordination team services today. [name] agreed to services and verbal consent obtained.  Engaged with patient by telephone for follow up visit in response to provider referral for case management and/or care coordination services.   Assessments/Interventions:  Review of past medical history, allergies, medications, health status, including review of consultants reports, laboratory and other test data, was performed as part of comprehensive evaluation and provision of chronic care management services.  SDOH (Social Determinants of Health) assessments and interventions performed:   Care Plan  Allergies  Allergen Reactions   Acetaminophen Itching and Swelling    Itching of the mouth, swelling of tongue and stomach started hurting mild   Hydrazine Yellow [Tartrazine] Shortness Of Breath and Swelling    Swelling mostly noticed in legs and feet, retaining urination, shortness of breaht, and minor chest pain   Lisinopril Shortness Of Breath    Was on prinzide; had sob/chest pain on it.    Medications Reviewed Today     Reviewed by [name], RN (Case Manager) on 11/26/21 at 1127  Med List Status: <None>   Medication Order Taking? Sig Documenting Provider Last Dose Status Informant  Accu-Chek FastClix Lancets MISC [id number] No USE 1 TO CHECK GLUCOSE 4 TIMES DAILY  Patient not taking: Reported on 11/05/2021   Charlott Rakes, MD Not Taking Active   albuterol (VENTOLIN HFA) 108 (90 Base) MCG/ACT inhaler [id number] No Inhale 2 puffs into the lungs every 6  (six) hours as needed for wheezing or shortness of breath. [name], NP Taking Active   aspirin 81 MG tablet [id number] No Take 1 tablet (81 mg total) by mouth daily. Clent Demark, PA-C Taking Active Self  atorvastatin (LIPITOR) 20 MG tablet [id number] No Take 1 tablet (20 mg total) by mouth daily. [name], NP Taking Active   atorvastatin (LIPITOR) 80 MG tablet [id number] No Take by mouth.  Patient not taking: Reported on 11/05/2021   [provider] Not Taking Active            Med Note [facility], Gwenette Greet A   Wed Oct 22, 2021 12:15 PM) Changed to 20 mg tablet.  baclofen (LIORESAL) 10 MG tablet [id number] No Take 0.5-1 tablets (5-10 mg total) by mouth 3 (three) times daily as needed for muscle spasms. Hilts, Legrand Como, MD Taking Active   BD PEN NEEDLE NANO 2ND GEN 32G X 4 MM MISC [id number] No  [provider] Taking Active Self  BD VEO INSULIN SYRINGE U/F 31G X 15/64" 1 ML MISC [id number] No USE  SYRINGE ONCE DAILY Elayne Snare, MD Taking Active Self  Blood Glucose Monitoring Suppl (ACCU-CHEK AVIVA PLUS) w/Device KIT [id number] No 1 each by Does not apply route as directed. Elayne Snare, MD Taking Active   Continuous Blood Gluc Transmit (DEXCOM G6 TRANSMITTER) MISC [id number] No Inject into the skin. [provider] Taking Active   diclofenac Sodium (VOLTAREN) 1 % GEL [id number] No Apply topically as needed. [provider] Taking Active   diphenhydrAMINE (BENADRYL) 25 MG tablet [id number] No Take 50 mg  Medicaid Managed Care   Nurse Care Manager Note  11/26/2021 Name:  Lindsay [name] MRN:  [id number] DOB:  12-11-1981  Lindsay [name] is an 40 y.o. year old female who is a primary patient of [name], NP.  The [facility] Managed Care Coordination team was consulted for assistance with:    CHF HTN DMII Asthma Chronic Back Pain   Lindsay [name] was given information about Medicaid Managed Care Coordination team services today. [name] agreed to services and verbal consent obtained.  Engaged with patient by telephone for follow up visit in response to provider referral for case management and/or care coordination services.   Assessments/Interventions:  Review of past medical history, allergies, medications, health status, including review of consultants reports, laboratory and other test data, was performed as part of comprehensive evaluation and provision of chronic care management services.  SDOH (Social Determinants of Health) assessments and interventions performed:   Care Plan  Allergies  Allergen Reactions   Acetaminophen Itching and Swelling    Itching of the mouth, swelling of tongue and stomach started hurting mild   Hydrazine Yellow [Tartrazine] Shortness Of Breath and Swelling    Swelling mostly noticed in legs and feet, retaining urination, shortness of breaht, and minor chest pain   Lisinopril Shortness Of Breath    Was on prinzide; had sob/chest pain on it.    Medications Reviewed Today     Reviewed by [name], RN (Case Manager) on 11/26/21 at 1127  Med List Status: <None>   Medication Order Taking? Sig Documenting Provider Last Dose Status Informant  Accu-Chek FastClix Lancets MISC [id number] No USE 1 TO CHECK GLUCOSE 4 TIMES DAILY  Patient not taking: Reported on 11/05/2021   Charlott Rakes, MD Not Taking Active   albuterol (VENTOLIN HFA) 108 (90 Base) MCG/ACT inhaler [id number] No Inhale 2 puffs into the lungs every 6  (six) hours as needed for wheezing or shortness of breath. [name], NP Taking Active   aspirin 81 MG tablet [id number] No Take 1 tablet (81 mg total) by mouth daily. Clent Demark, PA-C Taking Active Self  atorvastatin (LIPITOR) 20 MG tablet [id number] No Take 1 tablet (20 mg total) by mouth daily. [name], NP Taking Active   atorvastatin (LIPITOR) 80 MG tablet [id number] No Take by mouth.  Patient not taking: Reported on 11/05/2021   [provider] Not Taking Active            Med Note [facility], Gwenette Greet A   Wed Oct 22, 2021 12:15 PM) Changed to 20 mg tablet.  baclofen (LIORESAL) 10 MG tablet [id number] No Take 0.5-1 tablets (5-10 mg total) by mouth 3 (three) times daily as needed for muscle spasms. Hilts, Legrand Como, MD Taking Active   BD PEN NEEDLE NANO 2ND GEN 32G X 4 MM MISC [id number] No  [provider] Taking Active Self  BD VEO INSULIN SYRINGE U/F 31G X 15/64" 1 ML MISC [id number] No USE  SYRINGE ONCE DAILY Elayne Snare, MD Taking Active Self  Blood Glucose Monitoring Suppl (ACCU-CHEK AVIVA PLUS) w/Device KIT [id number] No 1 each by Does not apply route as directed. Elayne Snare, MD Taking Active   Continuous Blood Gluc Transmit (DEXCOM G6 TRANSMITTER) MISC [id number] No Inject into the skin. [provider] Taking Active   diclofenac Sodium (VOLTAREN) 1 % GEL [id number] No Apply topically as needed. [provider] Taking Active   diphenhydrAMINE (BENADRYL) 25 MG tablet [id number] No Take 50 mg

## 2021-11-26 NOTE — Patient Instructions (Signed)
Visit Information  Lindsay [name] was given information about Medicaid Managed Care team care coordination services as a part of their Healthy [facility] Medicaid benefit. Lindsay [name] verbally consented to engagement with the [facility] Managed Care team.   If you are experiencing a medical emergency, please call 911 or report to your local emergency department or urgent care.   If you have a non-emergency medical problem during routine business hours, please contact your provider's office and ask to speak with a nurse.   For questions related to your Healthy [facility] health plan, please call: [phone] or visit the homepage here: [email or web address]  If you would like to schedule transportation through your Healthy [facility] plan, please call the following number at least 2 days in advance of your appointment: [phone]  Call the [place] at [phone], at any time, 24 hours a day, 7 days a week. If you are in danger or need immediate medical attention call 911.  If you would like help to quit smoking, call 1-800-QUIT-NOW 705-659-2704) OR Espaol: 1-855-Djelo-Ya (8-453-646-8032) o para ms informacin haga clic aqu or Text READY to 200-400 to register via text  Lindsay [name] - following are the goals we discussed in your visit today:   Goals Addressed   None     Please see education materials related to today's visit provided by MyChart link.  The patient verbalized understanding of today's visit information and agrees to review information via My Chart link.  The Managed Medicaid care management team will reach out to the patient again over the next 30 days.   Salvatore Marvel RN, BSN Community Care Coordinator [facility] [place] [facility] Mobile: [phone]   Following is a copy of your plan of care:  Care Plan : [place] of Care  Updates made by [name], RN since  11/26/2021 12:00 AM     Problem: Chronic Disease Management and Care Coordination Needs for CHF, HTN, DM, Asthma, Chronic Back Pain   Priority: High     Long-Range Goal: Development of Plan of Care for Chronic Disease Management and Care Cooordination Needs (CHF, HTN, DM, Asthma, Chronic Back Pain)   Start Date: 10/22/2021  Expected End Date: 02/19/2022  Priority: High  Note:   Current Barriers:  Knowledge Deficits related to plan of care for management of CHF, HTN, DMII, Asthma, and Chronic Back Pain Care Coordination needs related to Limited social support, ADL IADL limitations, Limited access to caregiver, Inability to perform ADL's independently, and Inability to perform IADL's independently Chronic Disease Management support and education needs related to CHF, HTN, DMII, Asthma, and Chronic Back Pain. Lacks caregiver support No Advanced Directives in place  RNCM Clinical Goal(s):  Patient will verbalize understanding of plan for management of CHF, HTN, DMII, Asthma, and Chronic Back Pain verbalize basic understanding of  CHF, HTN, DMII, Asthma, and Chronic Back Pain disease process and self health management plan   take all medications exactly as prescribed and will call provider for medication related questions attend all scheduled medical appointments:  12/02/21 Podiatry Appt, Cataract surgery right eye on 12/04/21 demonstrate Improved adherence to prescribed treatment plan for CHF, HTN, DMII, Asthma, and Chronic Back Pain as evidenced by improved control of chronic diseases. demonstrate Improved health management independence   continue to work with RN Care Manager to address care management and care coordination needs related to  CHF, HTN, DMII, Asthma, and Chronic Back Pain work with pharmacist to address complex medication  regimen related toCHF, HTN, DMII, Asthma, and Chronic Back Pain work with Education officer, museum to address  related to the management of Limited social support, ADL IADL  limitations, Inability to perform ADL's independently, and Inability to perform IADL's independently related to the management of CHF, HTN, DMII, Asthma, and Chronic Back Pain demonstrate a decrease in CHF, HTN, DMII, Asthma, and Chronic Pain Pain  exacerbations   will demonstrate ongoing self health care management ability    through collaboration with RN Care manager, provider, and care team.   Interventions: Inter-disciplinary care team collaboration (see longitudinal plan of care) Evaluation of current treatment plan related to  self management and patient's adherence to plan as established by provider  Pain Interventions:  On-going Goal;  On Track-Yes Pain assessment performed Medications reviewed Reviewed provider established plan for pain management Discussed importance of adherence to all scheduled medical appointments Counseled on the importance of reporting any/all new or changed pain symptoms or management strategies to pain management provider Advised patient to report to care team affect of pain on daily activities Reviewed with patient prescribed pharmacological and nonpharmacological pain relief strategies  Hypertension Interventions:  On-going Goal; On Track-Yes Last practice recorded BP readings:  BP Readings from Last 3 Encounters:  11/05/21 132/80  08/15/21 116/72  07/31/21 137/72   Most recent eGFR/CrCl:  Lab Results  Component Value Date   EGFR 75 08/22/2021    No components found for: CRCL  Evaluation of current treatment plan related to hypertension self management and patient's adherence to plan as established by provider Reviewed medications with patient and discussed importance of compliance Discussed plans with patient for ongoing care management follow up and provided patient with direct contact information for care management team Advised patient, providing education and rationale, to monitor blood pressure daily and record, calling PCP for findings  outside established parameters Reviewed scheduled/upcoming provider appointments including:  Provided education on prescribed diet Low Sodium Heart Healthy Patient reports blood pressure readings have been within normal range but did have elevated reading of 165/93 recently.    Diabetes Interventions: On-going Goal:  On Track-Yes Assessed patient's understanding of A1c goal: <7% Provided education to patient about basic DM disease process Reviewed medications with patient and discussed importance of medication adherence Counseled on importance of regular laboratory monitoring as prescribed Discussed plans with patient for ongoing care management follow up and provided patient with direct contact information for care management team Reviewed scheduled/upcoming provider appointments including: Podiatry Appt on 12/02/21 and Cataract surgery 12/04/21 Left eye. Advised patient, providing education and rationale, to check cbg 4 times daily and record, calling PCP for findings outside established parameters Referral made to pharmacy team for assistance with complex medication regimen Referral made to social work team for assistance with possible PCS services .  Social worker continues to work with patient regarding [employer] [facility] Pharmacist attempted outreach visit on 11/10/21 but patient unavailable at that time.  Rescheduled Pharmacist visit on January 6,2023  Lab Results  Component Value Date   HGBA1C 8.3 (H) 07/11/2021     Heart Failure Interventions: On-going Goal; On Track-Yes Basic overview and discussion of pathophysiology of Heart Failure reviewed Provided education on low sodium diet Assessed need for readable accurate scales in home Provided education about placing scale on hard, flat surface Advised patient to weigh each morning after emptying bladder Discussed importance of daily weight and advised patient to weigh and record daily Reviewed role of diuretics in prevention of  fluid overload and management of  heart failure; Discussed the importance of keeping all appointments with provider  Asthma: (Status:Goal on track:  Yes. Provided patient with basic written and verbal Asthma education on self care/management/and exacerbation prevention Advised patient to track and manage Asthma triggers Provided instruction about proper use of medications used for management of Asthma including inhalers Provided education about and advised patient to utilize infection prevention strategies to reduce risk of respiratory infection Discussed the importance of adequate rest and management of fatigue with Asthma  Patient Goals/Self-Care Activities: Patient will self administer medications as prescribed Patient will attend all scheduled provider appointments Patient will call pharmacy for medication refills Patient will call provider office for new concerns or questions Patient will work with BSW to address care coordination needs and will continue to work with the clinical team to address health care and disease management related needs.   Patient will work with pharmacist to address complex medication regimen.  Follow Up Plan:  Telephone follow up appointment with care management team member scheduled for:  12/24/2021 at 10:00 AM

## 2021-11-27 ENCOUNTER — Other Ambulatory Visit (INDEPENDENT_AMBULATORY_CARE_PROVIDER_SITE_OTHER): Payer: Self-pay | Admitting: Primary Care

## 2021-11-27 MED ORDER — DICLOFENAC SODIUM 1 % EX GEL: 4.0000 g | CUTANEOUS | 1 refills | Status: DC | PRN

## 2021-11-27 NOTE — Telephone Encounter (Signed)
Sent to PCP ?

## 2021-12-02 ENCOUNTER — Other Ambulatory Visit: Payer: Self-pay

## 2021-12-02 ENCOUNTER — Ambulatory Visit: Payer: Medicaid Other | Admitting: Podiatry

## 2021-12-02 ENCOUNTER — Encounter: Payer: Self-pay | Admitting: Podiatry

## 2021-12-02 DIAGNOSIS — B351 Tinea unguium: Secondary | ICD-10-CM

## 2021-12-02 DIAGNOSIS — M79674 Pain in right toe(s): Secondary | ICD-10-CM | POA: Diagnosis not present

## 2021-12-02 DIAGNOSIS — E1149 Type 2 diabetes mellitus with other diabetic neurological complication: Secondary | ICD-10-CM

## 2021-12-02 DIAGNOSIS — M79675 Pain in left toe(s): Secondary | ICD-10-CM | POA: Diagnosis not present

## 2021-12-03 ENCOUNTER — Other Ambulatory Visit (INDEPENDENT_AMBULATORY_CARE_PROVIDER_SITE_OTHER): Payer: Self-pay | Admitting: Primary Care

## 2021-12-03 MED ORDER — DICLOFENAC SODIUM 1 % EX GEL: 4.0000 g | Freq: Four times a day (QID) | CUTANEOUS | 1 refills | Status: DC

## 2021-12-04 DIAGNOSIS — G4730 Sleep apnea, unspecified: Secondary | ICD-10-CM | POA: Diagnosis not present

## 2021-12-04 DIAGNOSIS — I10 Essential (primary) hypertension: Secondary | ICD-10-CM | POA: Diagnosis not present

## 2021-12-04 DIAGNOSIS — H25811 Combined forms of age-related cataract, right eye: Secondary | ICD-10-CM | POA: Diagnosis not present

## 2021-12-04 DIAGNOSIS — Z961 Presence of intraocular lens: Secondary | ICD-10-CM | POA: Insufficient documentation

## 2021-12-05 ENCOUNTER — Other Ambulatory Visit: Payer: Self-pay

## 2021-12-05 ENCOUNTER — Ambulatory Visit: Payer: Medicaid Other | Admitting: Podiatry

## 2021-12-05 NOTE — Patient Outreach (Signed)
Medicaid Managed Care Social Work Note  12/05/2021 Name:  Lindsay [name] MRN:  [id number] DOB:  08-09-1981  Lindsay [name] is an 40 y.o. year old female who is a primary patient of [name], NP.  The [facility] Managed Care Coordination team was consulted for assistance with:   PCS  Ms. [name] was given information about Medicaid Managed Care Coordination team services today. Roland Patient agreed to services and verbal consent obtained.  Engaged with patient  for by telephone forfollow up visit in response to referral for case management and/or care coordination services.   Assessments/Interventions:  Review of past medical history, allergies, medications, health status, including review of consultants reports, laboratory and other test data, was performed as part of comprehensive evaluation and provision of chronic care management services.  SDOH: (Social Determinant of Health) assessments and interventions performed:  BSW completed telephone outreach with patient to follow up with PCS. Patient stated she has not heard anything from her PCP or healthy blue about an assessment. BSW will reach out to PCP.  Advanced Directives Status:  Not addressed in this encounter.  Care Plan                 Allergies  Allergen Reactions   Acetaminophen Itching and Swelling    Itching of the mouth, swelling of tongue and stomach started hurting mild   Hydrazine Yellow [Tartrazine] Shortness Of Breath and Swelling    Swelling mostly noticed in legs and feet, retaining urination, shortness of breaht, and minor chest pain   Lisinopril Shortness Of Breath    Was on prinzide; had sob/chest pain on it.    Medications Reviewed Today     Reviewed by Ulice Brilliant (Pharmacy Technician) on 12/02/21 at 1617  Med List Status: <None>   Medication Order Taking? Sig Documenting Provider Last Dose Status Informant  Accu-Chek FastClix Lancets MISC [id number] No USE 1 TO  CHECK GLUCOSE 4 TIMES DAILY  Patient not taking: Reported on 11/05/2021   Charlott Rakes, MD Not Taking Active   albuterol (VENTOLIN HFA) 108 (90 Base) MCG/ACT inhaler [id number] No Inhale 2 puffs into the lungs every 6 (six) hours as needed for wheezing or shortness of breath. [name], NP Taking Active   aspirin 81 MG tablet [id number] No Take 1 tablet (81 mg total) by mouth daily. Clent Demark, PA-C Taking Active Self  atorvastatin (LIPITOR) 20 MG tablet [id number] No Take 1 tablet (20 mg total) by mouth daily. [name], NP Taking Active   atorvastatin (LIPITOR) 80 MG tablet [id number] No Take by mouth.  Patient not taking: Reported on 11/05/2021   [provider] Not Taking Active            Med Note [facility], Gwenette Greet A   Wed Oct 22, 2021 12:15 PM) Changed to 20 mg tablet.  baclofen (LIORESAL) 10 MG tablet [id number] No Take 0.5-1 tablets (5-10 mg total) by mouth 3 (three) times daily as needed for muscle spasms. Hilts, Legrand Como, MD Taking Active   BD PEN NEEDLE NANO 2ND GEN 32G X 4 MM MISC [id number] No  [provider] Taking Active Self  BD VEO INSULIN SYRINGE U/F 31G X 15/64" 1 ML MISC [id number] No USE  SYRINGE ONCE DAILY Elayne Snare, MD Taking Active Self  Blood Glucose Monitoring Suppl (ACCU-CHEK AVIVA PLUS) w/Device KIT [id number] No 1 each by Does not apply route as directed. Elayne Snare, MD Taking Active   Continuous Blood Gluc  Transmit (DEXCOM G6 TRANSMITTER) MISC [id number] No Inject into the skin. [provider] Taking Active   diclofenac Sodium (VOLTAREN) 1 % GEL [id number]  Apply 4 g topically as needed. [name], NP  Active   diphenhydrAMINE (BENADRYL) 25 MG tablet [id number] No Take 50 mg by mouth in the morning and at bedtime. Allergies [provider] Taking Active            Med Note [facility], MAUREEN A   Wed Oct 22, 2021 11:10 AM) Taking PRN for Allergies  DULoxetine (CYMBALTA) 30 MG capsule [id number] No  Take 1 capsule (30 mg total) by mouth daily. [name], NP Taking Active   fluticasone (FLOVENT HFA) 110 MCG/ACT inhaler [id number] No Inhale 2 puffs into the lungs 2 (two) times daily. [name], NP Taking Active   gabapentin (NEURONTIN) 300 MG capsule [id number]  TAKE 2 CAPSULES BY MOUTH THREE TIMES DAILY [name], NP  Active   glucose blood (ACCU-CHEK AVIVA PLUS) test strip [id number] No Use as instructed to test blood sugar 4 times daily. [name], NP Taking Active   HUMULIN R U-500 KWIKPEN 500 UNIT/ML [name] [id number] No Inject 75-80 Units into the skin 3 (three) times daily with meals. AM 80 units  Noon 75 units  Evening 75 units If BG above 200 add 5 units to prescribed dose [provider] Taking Active Self           Med Note [name], ERICIA A   Fri Jul 11, 2021  4:07 AM) 1 dose as of 07/21, took 25 units due to not eating   hydrALAZINE (APRESOLINE) 100 MG tablet [id number] No Take 100 mg by mouth 3 (three) times daily. [provider] Taking Active            Med Note [facility], MAUREEN A   Wed Oct 22, 1760 60:73 PM) Duplication Entry  hydrochlorothiazide (HYDRODIURIL) 25 MG tablet [id number] No Take 25 mg by mouth 3 (three) times daily. [provider] Taking Active   Insulin Pen Needle (B-D UF III MINI PEN NEEDLES) 31G X 5 MM MISC [id number] No Use as instructed. Monitor blood glucose levels three times per day [name], NP Taking Active Self  isosorbide mononitrate (IMDUR) 30 MG 24 hr tablet [id number] No TAKE 1 TABLET BY MOUTH DAILY  Patient not taking: Reported on 11/05/2021   [name], MD Not Taking Active            Med Note [facility], MAUREEN A   Wed Oct 22, 2021 12:18 PM) Changed to 60 mg tablet  isosorbide mononitrate (IMDUR) 60 MG 24 hr tablet [id number] No Take 1 tablet (60 mg total) by mouth daily. [name], [name], MD Taking Active   ketorolac (ACULAR) 0.5 % ophthalmic solution [id number] No   [provider] Taking Active            Med Note [facility], Gwenette Greet A   Wed Oct 22, 2021 12:20 PM) Cataract Surgery scheduled for 11/06/21 Left Eye and 12/04/21 Right Eye  liraglutide (VICTOZA) 18 MG/3ML SOPN [id number] No Inject 1.8 mg into the skin at bedtime.  [provider] Taking Active Self           Med Note Chanetta Marshall Apr 01, 2020  9:48 AM)    losartan (COZAAR) 100 MG tablet [id number]  TAKE 1 TABLET BY MOUTH ONCE DAILY [name], MD  Active   Melatonin 10 MG  TABS [id number] No Take 3 tablets by mouth at bedtime. [provider] Taking Active            Med Note [facility], MAUREEN A   Wed Oct 22, 2021 11:14 AM) Taking PRN  metoprolol tartrate (LOPRESSOR) 50 MG tablet [id number] No Take 1 tablet (50 mg total) by mouth 2 (two) times daily. [name], [name], MD Taking Active   moxifloxacin (VIGAMOX) 0.5 % ophthalmic solution [id number] No  [provider] Taking Active            Med Note [facility], Domenic Moras   Wed Oct 22, 2021 12:20 PM) Cataract Surgery scheduled for 11/06/21 Left Eye and 12/04/21 Right Eye  Multiple Vitamins-Minerals (MULTIVITAMIN PO) [id number] No Take 1 tablet by mouth daily. [provider] Taking Active Self  nitroGLYCERIN (NITROSTAT) 0.4 MG SL tablet [id number] No Place 1 tablet (0.4 mg total) under the tongue every 5 (five) minutes as needed for chest pain. Please keep upcoming appt in October 2022. Final Attempt [name], MD Taking Active Self  ofloxacin (OCUFLOX) 0.3 % ophthalmic solution [id number] No Place 1 drop into the left eye 4 (four) times daily. [provider] Taking Active   potassium chloride SA (KLOR-CON) 20 MEQ tablet [id number] No Take 1 tablet (20 mEq total) by mouth daily. [name], MD Taking Active   prednisoLONE acetate (PRED FORTE) 1 % ophthalmic suspension [id number] No Place 1 drop into the left eye 4 (four) times daily. [provider] Taking Active    sodium chloride (OCEAN) 0.65 % nasal spray [id number] No Place into the nose as needed. [provider] Taking Active   torsemide (DEMADEX) 20 MG tablet [id number] No Take by mouth 2 (two) times daily. Dose Change: Take 2 tabs (40 mg total) twice a day. [provider] Taking Active Self            Patient Active Problem List   Diagnosis Date Noted   Acute respiratory failure with hypoxia ([place])    Acute on chronic diastolic heart failure (HCC)    CAP (community acquired pneumonia) 07/11/2021   Elevated liver enzymes 05/23/2020   Spondylosis of lumbar spine 06/22/2019   Pedal edema 06/22/2019   Hyperlipidemia 06/22/2019   Normocytic anemia 06/22/2019   Vitamin D deficiency 06/22/2019   Uncontrolled type 2 diabetes mellitus with diabetic neuropathy, with long-term current use of insulin 08/31/2017   OSA (obstructive sleep apnea) 11/03/2016   Coronary artery disease involving native coronary artery of native heart with angina pectoris ([place]) 09/11/2016   Diabetes mellitus with complication (HCC)    Chest pain of uncertain etiology 18/86/7737   Community acquired pneumonia 09/07/2016   MI, old    DM neuropathy, type II diabetes mellitus ([place]) 06/10/2016   Morbid obesity ([place]) 06/10/2016   Pressure ulcer 05/17/2015   Anaerobic abscess ([place])    ARF (acute renal failure) (HCC)    Septic arthritis ([place])    Essential hypertension 04/30/2015   Cellulitis 04/30/2015   Abscess of right shoulder     Conditions to be addressed/monitored per PCP order:   pcs  There are no care plans that you recently modified to display for this patient.   Follow up:  Patient agrees to Care Plan and Follow-up.  Plan: The Managed Medicaid care management team will reach out to the patient again over the next 30 days.  Date/time of next scheduled Social Work care management/care coordination outreach:  01/05/21  Mickel Fuchs, [place], [place]  [place] Managed Medicaid Team  [phone]

## 2021-12-05 NOTE — Patient Instructions (Signed)
Visit Information  Lindsay [name] was given information about Medicaid Managed Care team care coordination services as a part of their Healthy [facility] Medicaid benefit. Lindsay [name] verbally consented to engagement with the [facility] Managed Care team.   If you are experiencing a medical emergency, please call 911 or report to your local emergency department or urgent care.   If you have a non-emergency medical problem during routine business hours, please contact your provider's office and ask to speak with a nurse.   For questions related to your Healthy [facility] health plan, please call: [phone] or visit the homepage here: [email or web address]  If you would like to schedule transportation through your Healthy [facility] plan, please call the following number at least 2 days in advance of your appointment: [phone]  Call the [facility] Crisis Line at [phone], at any time, 24 hours a day, 7 days a week. If you are in danger or need immediate medical attention call 911.  If you would like help to quit smoking, call 1-800-QUIT-NOW ((512) 308-4140) OR Espaol: 1-855-Djelo-Ya (0-254-270-6237) o para ms informacin haga clic aqu or Text READY to 628-315 to register via text  [name] - following are the goals we discussed in your visit today:   Goals Addressed   None     Social Worker will follow up with patient's PCP and patient.   Gus Puma, BSW, MHA [facility]   [place]  High Risk Managed Medicaid Team  [phone]   Following is a copy of your plan of care:  There are no care plans that you recently modified to display for this patient.

## 2021-12-07 NOTE — Progress Notes (Signed)
Subjective: Lindsay [name] is a 40 y.o. female patient seen today for follow up of  painful thick toenails that are difficult to trim. Pain interferes with ambulation. Aggravating factors include wearing enclosed shoe gear. Pain is relieved with periodic professional debridement.  New problems reported today: None.  Patient states their blood glucose was 107 mg/dl today.   PCP is Grayce Sessions, NP. Last visit was: 08/28/2021.  Allergies  Allergen Reactions   Acetaminophen Itching and Swelling    Itching of the mouth, swelling of tongue and stomach started hurting mild   Hydrazine Yellow [Tartrazine] Shortness Of Breath and Swelling    Swelling mostly noticed in legs and feet, retaining urination, shortness of breaht, and minor chest pain   Lisinopril Shortness Of Breath    Was on prinzide; had sob/chest pain on it.    Objective: Physical Exam  General: Patient is a pleasant 40 y.o. African American female morbidly obese in NAD. AAO x 3.   Neurovascular Examination: CFT <3 seconds b/l LE. Palpable DP/PT pulses b/l LE. Digital hair sparse b/l. Skin temperature gradient WNL b/l. No pain with calf compression b/l. No edema noted b/l. No cyanosis or clubbing noted b/l LE.  Protective sensation diminished with 10g monofilament b/l.  Dermatological:  Pedal integument with normal turgor, texture and tone b/l LE. No open wounds b/l. No interdigital macerations b/l. Toenails 1-5 b/l elongated, thickened, discolored with subungual debris. +Tenderness with dorsal palpation of nailplates. No hyperkeratotic or porokeratotic lesions present.  Musculoskeletal:  Muscle strength 5/5 to all lower extremity muscle groups bilaterally. No pain, crepitus or joint limitation noted with ROM bilateral LE. Pes planus deformity noted bilateral LE. Utilizes rollator for ambulation assistance.  Assessment: 1. Pain due to onychomycosis of toenails of both feet   2. Type II diabetes mellitus with  neurological manifestations [facility])     Plan: Patient was evaluated and treated and all questions answered. Consent given for treatment as described below: -No new findings. No new orders. -Continue foot and shoe inspections daily. Monitor blood glucose per PCP/Endocrinologist's recommendations. -Mycotic toenails 1-5 bilaterally were debrided in length and girth with sterile nail nippers and dremel without incident. -Patient/POA to call should there be question/concern in the interim.  Return in about 3 months (around 03/02/2022).  Freddie Breech, DPM

## 2021-12-08 DIAGNOSIS — I5033 Acute on chronic diastolic (congestive) heart failure: Secondary | ICD-10-CM | POA: Diagnosis not present

## 2021-12-08 DIAGNOSIS — E662 Morbid (severe) obesity with alveolar hypoventilation: Secondary | ICD-10-CM | POA: Diagnosis not present

## 2021-12-08 DIAGNOSIS — J9601 Acute respiratory failure with hypoxia: Secondary | ICD-10-CM | POA: Diagnosis not present

## 2021-12-24 ENCOUNTER — Other Ambulatory Visit: Payer: Self-pay

## 2021-12-24 NOTE — Patient Outreach (Signed)
Care Coordination  12/24/2021  Jya D Barret 02/13/1981 [id number]  12/24/2021 Name: KHRYSTAL JEANMARIE MRN: [id number] DOB: 01-01-1981  Referred by: Grayce Sessions, NP Reason for referral : High Risk Managed Medicaid (Unsuccessful Telephone Outreach)   An unsuccessful telephone outreach was attempted today. The patient was referred to the case management team for assistance with care management and care coordination.    Follow Up Plan: A HIPAA compliant phone message was left with 2 attempted telephone outreaches today for the patient providing contact information and requesting a return call. The Managed Medicaid care management team will reach out to the patient again over the next 7 - 14 days.    Virgina Norfolk RN, BSN Community Care Coordinator [facility]   [facility] Mobile: [phone]

## 2021-12-24 NOTE — Patient Instructions (Signed)
Lindsay [name] ,   The Medicaid Managed Care Team is available to provide assistance to you with your healthcare needs at no cost and as a benefit of your Medicaid Health plan. I'm sorry I was unable to reach you today for our scheduled appointment. Our care guide will call you to reschedule our telephone appointment. Please call me at the number below. I am available to be of assistance to you regarding your healthcare needs. .   Thank you,   Thales Knipple RN, BSN Community Care Coordinator [place]  [facility] Mobile: [phone]   

## 2021-12-26 ENCOUNTER — Encounter: Payer: Self-pay | Admitting: Physician Assistant

## 2021-12-26 ENCOUNTER — Other Ambulatory Visit: Payer: Self-pay | Admitting: Physician Assistant

## 2021-12-26 ENCOUNTER — Other Ambulatory Visit: Payer: Self-pay

## 2021-12-26 ENCOUNTER — Ambulatory Visit: Payer: Medicaid Other | Admitting: Physician Assistant

## 2021-12-26 ENCOUNTER — Ambulatory Visit (INDEPENDENT_AMBULATORY_CARE_PROVIDER_SITE_OTHER): Payer: Medicaid Other

## 2021-12-26 VITALS — Ht 67.5 in | Wt >= 6400 oz

## 2021-12-26 DIAGNOSIS — M545 Low back pain, unspecified: Secondary | ICD-10-CM

## 2021-12-26 DIAGNOSIS — G8929 Other chronic pain: Secondary | ICD-10-CM

## 2021-12-26 MED ORDER — METHOCARBAMOL 500 MG PO TABS: 750.0000 mg | ORAL_TABLET | Freq: Three times a day (TID) | ORAL | 0 refills | Status: DC | PRN

## 2021-12-26 NOTE — Progress Notes (Signed)
Office Visit Note   Patient: Lindsay [name]           Date of Birth: 01-Mar-1981           MRN: [id number] Visit Date: 12/26/2021              Requested by: [name], NP [address] [place],  [place] [address] PCP: [name], NP  Chief Complaint  Patient presents with   Lower Back - Pain      HPI: Patient is a pleasant 41 year old woman with a long history of chronic back pain times many many years.  She says this first happened when she was picking up a backpack.  She has had difficulties on and off.  She has pain in both of her posterior hip buttocks that radiates down both legs.  She denies any loss of bowel or bladder control.  She denies any weakness.  She does have some paresthesias but states these are secondary to neuropathy from her diabetes.  She is a diabetic with most recent hemoglobin A1c of 7.9.  She also has a history of cardiac issues.  Her current BMI is 66.  She has been working with weight loss through [facility] but has not been lately because she said they are asking her to be more active than she can feel she can be right now.  She said that Dr. Junius Roads used to prescribe her baclofen and tramadol.  She has done physical therapy in the past  Assessment & Plan: Visit Diagnoses:  1. Chronic midline low back pain, unspecified whether sciatica present     Plan: Chronic lower back pain no signs of weakness no signs of changes.  We discussed the issue of her weight and its relationship to her back issues and she understands this.  She does intend at some point to get back with weight loss.  We discussed physical therapy but she says she has exercises to do on her own.  She has had epidural injections in the past but they have not been terribly helpful.  She has taken Robaxin 750 3 times daily.  We can try this again.  She has tolerated it fine.  She understands she has a difficult problem.  She may follow-up as needed on leaving she begins to talk about  shoulder pain she had surgery with Raliegh Ip in 2016 she did have a little pop the other day.  It is painful but she did not have any paresthesias.  I encouraged her to follow-up with her surgical doctor  Follow-Up Instructions: No follow-ups on file.   Ortho Exam  Patient is alert, oriented, no adenopathy, well-dressed, normal affect, normal respiratory effort. Examination of her spine.  She has no tenderness to palpation over the spine.  She has some tenderness in the posterior buttocks consistent with some sciatica.  She has 5/5 strength bilaterally with dorsiflexion plantar  Imaging: XR Lumbar Spine 2-3 Views  Result Date: 12/26/2021 2 view radiographs of her spine were reviewed today.  Study is limited secondary to habitus.  Overall well-maintained alignment.  No acute osseous fractures.  No listhesis  XR Pelvis 1-2 Views  Result Date: 12/26/2021 AP pelvis was reviewed today.  Demonstrates well-maintained alignment acute no acute osseous injuries study is limited secondary to patient habitus  No images are attached to the encounter.  Labs: Lab Results  Component Value Date   HGBA1C 8.3 (H) 07/11/2021   HGBA1C 8.4 (H) 04/12/2019   HGBA1C  10.1 (A) 01/11/2019   ESRSEDRATE 63 (H) 06/12/2015   ESRSEDRATE 123 (H) 05/16/2015   ESRSEDRATE 125 (H) 04/30/2015   CRP 3.4 (H) 07/17/2021   CRP 5.0 (H) 07/16/2021   CRP 7.9 (H) 07/15/2021   LABURIC 11.2 (H) 07/14/2021   LABURIC 6.5 02/09/2008   REPTSTATUS 07/12/2021 FINAL 07/11/2021   GRAMSTAIN  01/11/2019    FEW WBC PRESENT, PREDOMINANTLY PMN MODERATE GRAM POSITIVE COCCI    CULT (A) 07/11/2021    <10,000 COLONIES/mL INSIGNIFICANT GROWTH Performed at [facility] [address] [place], [place] [address]    LABORGA NO GROWTH 2 DAYS 02/11/2017     Lab Results  Component Value Date   ALBUMIN 2.8 (L) 07/17/2021   ALBUMIN 2.9 (L) 07/16/2021   ALBUMIN 2.9 (L) 07/15/2021    Lab Results  Component Value Date   MG 1.8  07/31/2021   MG 1.8 07/30/2021   MG 1.8 07/29/2021   Lab Results  Component Value Date   VD25OH 25 (L) 07/16/2014    No results found for: PREALBUMIN CBC EXTENDED Latest Ref Rng & Units 07/30/2021 07/21/2021 07/18/2021  WBC 4.0 - 10.5 K/uL 4.0 6.9 6.9  RBC 3.87 - 5.11 MIL/uL 3.37(L) 3.13(L) 3.23(L)  HGB 12.0 - 15.0 g/dL 8.7(L) 8.1(L) 8.6(L)  HCT 36.0 - 46.0 % 28.8(L) 27.2(L) 27.6(L)  PLT 150 - 400 K/uL 259 373 314  NEUTROABS 1.7 - 7.7 K/uL - - 4.4  LYMPHSABS 0.7 - 4.0 K/uL - - 1.5     Body mass index is 66.35 kg/m.  Orders:  Orders Placed This Encounter  Procedures   XR Pelvis 1-2 Views   XR Lumbar Spine 2-3 Views   No orders of the defined types were placed in this encounter.    Procedures: No procedures performed  Clinical Data: No additional findings.  ROS:  All other systems negative, except as noted in the HPI. Review of Systems  Objective: Vital Signs: Ht 5' 7.5" (1.715 m)    Wt (!) 430 lb (195 kg)    BMI 66.35 kg/m   Specialty Comments:  No specialty comments available.  PMFS History: Patient Active Problem List   Diagnosis Date Noted   Acute respiratory failure with hypoxia ([place])    Acute on chronic diastolic heart failure ([place])    CAP (community acquired pneumonia) 07/11/2021   Elevated liver enzymes 05/23/2020   Spondylosis of lumbar spine 06/22/2019   Pedal edema 06/22/2019   Hyperlipidemia 06/22/2019   Normocytic anemia 06/22/2019   Vitamin D deficiency 06/22/2019   Uncontrolled type 2 diabetes mellitus with diabetic neuropathy, with long-term current use of insulin 08/31/2017   OSA (obstructive sleep apnea) 11/03/2016   Coronary artery disease involving native coronary artery of native heart with angina pectoris ([place]) 09/11/2016   Diabetes mellitus with complication (HCC)    Chest pain of uncertain etiology [id number]   Community acquired pneumonia 09/07/2016   MI, old    DM neuropathy, type II diabetes mellitus ([place]) 06/10/2016   Morbid  obesity ([place]) 06/10/2016   Pressure ulcer 05/17/2015   Anaerobic abscess ([place])    ARF (acute renal failure) ([place])    Septic arthritis ([place])    Essential hypertension 04/30/2015   Cellulitis 04/30/2015   Abscess of right shoulder    Past Medical History:  Diagnosis Date   ARF (acute renal failure) ([place]) 04/2015   Asthma    Cellulitis of right upper extremity    Coronary artery disease    Diabetes mellitus  insulin dependent   Hyperlipidemia LDL goal <70    Hypertension    NSTEMI (non-ST elevated myocardial infarction) ([place]) 08/2016   Obesity    S/P angioplasty with stent 08/2016   DES to mLAD and PTCA only to 2nd diag ostium.     Family History  Problem Relation Age of Onset   Diabetes Mother    Hypertension Mother    Heart disease Mother    Diabetes Father    Heart disease Father    Stroke Maternal Grandmother    Cancer Maternal Grandmother     Past Surgical History:  Procedure Laterality Date   CARDIAC CATHETERIZATION N/A 09/07/2016   Procedure: Left Heart Cath and Coronary Angiography;  Surgeon: [name], MD;  Location: [place] [facility];  Service: Cardiovascular;  Laterality: N/A;   CARDIAC CATHETERIZATION N/A 09/07/2016   Procedure: Coronary Stent Intervention;  Surgeon: [name], MD;  Location: [place] [facility];  Service: Cardiovascular;  Laterality: N/A;   CARDIAC CATHETERIZATION N/A 09/07/2016   Procedure: Coronary Balloon Angioplasty;  Surgeon: [name], MD;  Location: [place] [facility];  Service: Cardiovascular;  Laterality: N/A;   CESAREAN SECTION     IRRIGATION AND DEBRIDEMENT SHOULDER Right 04/30/2015   Procedure: IRRIGATION AND DEBRIDEMENT SHOULDER;  Surgeon: Renette Butters, MD;  Location: [facility] [place];  Service: Orthopedics;  Laterality: Right;   IRRIGATION AND DEBRIDEMENT SHOULDER Right 05/01/2015   LEFT HEART CATH AND CORONARY ANGIOGRAPHY N/A 04/25/2020   Procedure: LEFT HEART CATH AND CORONARY ANGIOGRAPHY;  Surgeon: [name], MD;   Location: [place] [facility];  Service: Cardiovascular;  Laterality: N/A;   LEG SURGERY     SHOULDER ARTHROSCOPY Right 04/30/2015   Procedure: ARTHROSCOPY SHOULDER;  Surgeon: Renette Butters, MD;  Location: [facility];  Service: Orthopedics;  Laterality: Right;   TONSILLECTOMY     Social History   Occupational History   Not on file  Tobacco Use   Smoking status: Former    Packs/day: 0.25    Years: 2.00    Pack years: 0.50    Types: Cigarettes   Smokeless tobacco: Never  Vaping Use   Vaping Use: Never used  Substance and Sexual Activity   Alcohol use: Not Currently    Comment: socially   Drug use: No   Sexual activity: Not on file

## 2022-01-05 ENCOUNTER — Other Ambulatory Visit: Payer: Self-pay

## 2022-01-05 NOTE — Patient Outreach (Signed)
Medicaid Managed Care Social Work Note  01/05/2022 Name:  Lindsay [name] MRN:  [id number] DOB:  28-May-1981  Lindsay [name] is an 41 y.o. year old female who is a primary patient of [name], NP.  The [facility] Managed Care Coordination team was consulted for assistance with:   pcs  Ms. Pienta was given information about Medicaid Managed Care Coordination team services today. [place] Patient agreed to services and verbal consent obtained.  Engaged with patient  for by telephone forfollow up visit in response to referral for case management and/or care coordination services.   Assessments/Interventions:  Review of past medical history, allergies, medications, health status, including review of consultants reports, laboratory and other test data, was performed as part of comprehensive evaluation and provision of chronic care management services.  SDOH: (Social Determinant of Health) assessments and interventions performed: Patient stated she has not heard anything from her Pcp about PCS. Patient stated she does have one more follow up appointment with her her PCP and was going to ask about it. BSW will send another message to patient's PCP about sending over a script for PCS. No there resources are needed at this time.   Advanced Directives Status:  Not addressed in this encounter.  Care Plan                 Allergies  Allergen Reactions   Acetaminophen Itching and Swelling    Itching of the mouth, swelling of tongue and stomach started hurting mild   Hydrazine Yellow [Tartrazine] Shortness Of Breath and Swelling    Swelling mostly noticed in legs and feet, retaining urination, shortness of breaht, and minor chest pain   Lisinopril Shortness Of Breath    Was on prinzide; had sob/chest pain on it.    Medications Reviewed Today     Reviewed by Persons, Bevely Palmer, PA (Physician Assistant) on 12/26/21 at 1403  Med List Status: <None>   Medication Order Taking?  Sig Documenting Provider Last Dose Status Informant  Accu-Chek FastClix Lancets MISC [id number] Yes USE 1 TO CHECK GLUCOSE 4 TIMES DAILY [name], [name], MD Taking Active   albuterol (VENTOLIN HFA) 108 (90 Base) MCG/ACT inhaler [id number] Yes Inhale 2 puffs into the lungs every 6 (six) hours as needed for wheezing or shortness of breath. [name], NP Taking Active            Med Note Georgina Peer, RACHELLE   Fri Dec 26, 2021 10:59 AM)    aspirin 81 MG tablet [id number] Yes Take 1 tablet (81 mg total) by mouth daily. Clent Demark, PA-C Taking Active Self  atorvastatin (LIPITOR) 20 MG tablet [id number] Yes Take 1 tablet (20 mg total) by mouth daily. [name], NP Taking Active   baclofen (LIORESAL) 10 MG tablet [id number] Yes Take 0.5-1 tablets (5-10 mg total) by mouth 3 (three) times daily as needed for muscle spasms. Hilts, Legrand Como, MD Taking Active   BD PEN NEEDLE NANO 2ND GEN 32G X 4 MM MISC [id number] Yes  [provider] Taking Active Self  BD VEO INSULIN SYRINGE U/F 31G X 15/64" 1 ML MISC [id number] Yes USE  SYRINGE ONCE DAILY Elayne Snare, MD Taking Active Self  Blood Glucose Monitoring Suppl (ACCU-CHEK AVIVA PLUS) w/Device KIT [id number] Yes 1 each by Does not apply route as directed. Elayne Snare, MD Taking Active   Continuous Blood Gluc Transmit (DEXCOM G6 TRANSMITTER) MISC [id number] Yes Inject into the skin. [provider] Taking Active  diclofenac Sodium (VOLTAREN) 1 % GEL [id number] Yes Apply 4 g topically 4 (four) times daily. [name], NP Taking Active   diphenhydrAMINE (BENADRYL) 25 MG tablet [id number] Yes Take 50 mg by mouth in the morning and at bedtime. Allergies [provider] Taking Active            Med Note [facility], MAUREEN A   Wed Oct 22, 2021 11:10 AM) Taking PRN for Allergies  DULoxetine (CYMBALTA) 30 MG capsule [id number] Yes Take 1 capsule (30 mg total) by mouth daily. [name], NP Taking Active   fluticasone  (FLOVENT HFA) 110 MCG/ACT inhaler [id number] Yes Inhale 2 puffs into the lungs 2 (two) times daily. [name], NP Taking Active   gabapentin (NEURONTIN) 300 MG capsule [id number] Yes TAKE 2 CAPSULES BY MOUTH THREE TIMES DAILY [name], NP Taking Active   glucose blood (ACCU-CHEK AVIVA PLUS) test strip [id number] Yes Use as instructed to test blood sugar 4 times daily. [name], NP Taking Active   HUMULIN R U-500 KWIKPEN 500 UNIT/ML [name] [id number] Yes Inject 75-80 Units into the skin 3 (three) times daily with meals. AM 80 units  Noon 75 units  Evening 75 units If BG above 200 add 5 units to prescribed dose [provider] Taking Active Self           Med Note [name], ERICIA A   Fri Jul 11, 2021  4:07 AM) 1 dose as of 07/21, took 25 units due to not eating   hydrALAZINE (APRESOLINE) 100 MG tablet [id number] Yes Take 100 mg by mouth 3 (three) times daily. [provider] Taking Active            Med Note [facility], Gwenette Greet A   Wed Oct 22, 566 88:93 PM) Duplication Entry  hydrochlorothiazide (HYDRODIURIL) 25 MG tablet [id number] Yes Take 25 mg by mouth 3 (three) times daily. [provider] Taking Active   Insulin Pen Needle (B-D UF III MINI PEN NEEDLES) 31G X 5 MM MISC [id number] Yes Use as instructed. Monitor blood glucose levels three times per day [name], NP Taking Active Self  isosorbide mononitrate (IMDUR) 30 MG 24 hr tablet [id number] Yes TAKE 1 TABLET BY MOUTH DAILY [name], MD Taking Active            Med Note [facility], MAUREEN A   Wed Oct 22, 2021 12:18 PM) Changed to 60 mg tablet  isosorbide mononitrate (IMDUR) 60 MG 24 hr tablet [id number] Yes Take 1 tablet (60 mg total) by mouth daily. [name], [name], MD Taking Active   ketorolac Nancie Neas) 0.5 % ophthalmic solution [id number] Yes  [provider] Taking Active            Med Note [facility], Gwenette Greet A   Wed Oct 22, 2021 12:20 PM) Cataract Surgery  scheduled for 11/06/21 Left Eye and 12/04/21 Right Eye  liraglutide (VICTOZA) 18 MG/3ML SOPN [id number] Yes Inject 1.8 mg into the skin at bedtime.  [provider] Taking Active Self           Med Note Chanetta Marshall Apr 01, 2020  9:48 AM)    losartan (COZAAR) 100 MG tablet [id number] Yes TAKE 1 TABLET BY MOUTH ONCE DAILY [name], MD Taking Active   Melatonin 10 MG TABS [id number] Yes Take 3 tablets by mouth at bedtime. [provider] Taking Active  Med Note [facility], MAUREEN A   Wed Oct 22, 2021 11:14 AM) Taking PRN  methocarbamol (ROBAXIN) 500 MG tablet [id number]  Take 1.5 tablets (750 mg total) by mouth every 8 (eight) hours as needed for muscle spasms. Persons, [place], [place]  Active   metoprolol tartrate (LOPRESSOR) 50 MG tablet [id number] Yes Take 1 tablet (50 mg total) by mouth 2 (two) times daily. [name], Leana Roe, MD Taking Active   moxifloxacin (VIGAMOX) 0.5 % ophthalmic solution [id number] Yes  [provider] Taking Active            Med Note [facility], Marisue Humble A   Wed Oct 22, 2021 12:20 PM) Cataract Surgery scheduled for 11/06/21 Left Eye and 12/04/21 Right Eye  Multiple Vitamins-Minerals (MULTIVITAMIN PO) [id number] Yes Take 1 tablet by mouth daily. [provider] Taking Active Self  nitroGLYCERIN (NITROSTAT) 0.4 MG SL tablet [id number] Yes Place 1 tablet (0.4 mg total) under the tongue every 5 (five) minutes as needed for chest pain. Please keep upcoming appt in October 2022. Final Attempt Lyn Records, MD Taking Active Self  ofloxacin (OCUFLOX) 0.3 % ophthalmic solution [id number] Yes Place 1 drop into the left eye 4 (four) times daily. [provider] Taking Active   potassium chloride SA (KLOR-CON) 20 MEQ tablet [id number] Yes Take 1 tablet (20 mEq total) by mouth daily. Lyn Records, MD Taking Active   prednisoLONE acetate (PRED FORTE) 1 % ophthalmic suspension [id number] Yes Place 1 drop into the left eye  4 (four) times daily. [provider] Taking Active   sodium chloride (OCEAN) 0.65 % nasal spray [id number] Yes Place into the nose as needed. [provider] Taking Active   torsemide (DEMADEX) 20 MG tablet [id number] Yes Take by mouth 2 (two) times daily. Dose Change: Take 2 tabs (40 mg total) twice a day. [provider] Taking Active Self            Patient Active Problem List   Diagnosis Date Noted   Acute respiratory failure with hypoxia (HCC)    Acute on chronic diastolic heart failure (HCC)    CAP (community acquired pneumonia) 07/11/2021   Elevated liver enzymes 05/23/2020   Spondylosis of lumbar spine 06/22/2019   Pedal edema 06/22/2019   Hyperlipidemia 06/22/2019   Normocytic anemia 06/22/2019   Vitamin D deficiency 06/22/2019   Uncontrolled type 2 diabetes mellitus with diabetic neuropathy, with long-term current use of insulin 08/31/2017   OSA (obstructive sleep apnea) 11/03/2016   Coronary artery disease involving native coronary artery of native heart with angina pectoris (HCC) 09/11/2016   Diabetes mellitus with complication (HCC)    Chest pain of uncertain etiology 09/07/2016   Community acquired pneumonia 09/07/2016   MI, old    DM neuropathy, type II diabetes mellitus (HCC) 06/10/2016   Morbid obesity (HCC) 06/10/2016   Pressure ulcer 05/17/2015   Anaerobic abscess (HCC)    ARF (acute renal failure) (HCC)    Septic arthritis (HCC)    Essential hypertension 04/30/2015   Cellulitis 04/30/2015   Abscess of right shoulder     Conditions to be addressed/monitored per PCP order:   PCS  There are no care plans that you recently modified to display for this patient.   Follow up:  Patient agrees to Care Plan and Follow-up.  Plan: The Managed Medicaid care management team will reach out to the patient again over the next 30 days.  Date/time of next scheduled Social Work care management/care coordination outreach:  02/05/22  Mickel Fuchs, BSW, [place] Managed Medicaid Team  [phone]

## 2022-01-05 NOTE — Patient Instructions (Signed)
Visit Information  [name] was given information about Medicaid Managed Care team care coordination services as a part of their [employer] Medicaid benefit. [name] verbally consented to engagement with the Medicaid Managed Care team.   If you are experiencing a medical emergency, please call 911 or report to your local emergency department or urgent care.   If you have a non-emergency medical problem during routine business hours, please contact your provider's office and ask to speak with a nurse.   For questions related to your Healthy [employer] Medicaid health plan, please call: [phone] or visit the homepage here: [email or web address]  If you would like to schedule transportation through your Healthy Blue Medicaid plan, please call the following number at least 2 days in advance of your appointment: [phone]  Call the Behavioral Health Crisis Line at [phone], at any time, 24 hours a day, 7 days a week. If you are in danger or need immediate medical attention call 911.  If you would like help to quit smoking, call 1-800-QUIT-NOW (1-800-784-8669) OR Espaol: 1-855-Djelo-Ya (1-855-335-3569) o para ms informacin haga clic aqu or Text READY to 200-400 to register via text  [name] - following are the goals we discussed in your visit today:   Goals Addressed   None      Social Worker will follow up in 30 days.   Lindsay [name], BSW, MHA [facility]  [place]  High Risk Managed Medicaid Team  [phone]   Following is a copy of your plan of care:  There are no care plans that you recently modified to display for this patient.    

## 2022-01-07 ENCOUNTER — Ambulatory Visit: Payer: Medicaid Other | Admitting: Physician Assistant

## 2022-01-08 DIAGNOSIS — I5033 Acute on chronic diastolic (congestive) heart failure: Secondary | ICD-10-CM | POA: Diagnosis not present

## 2022-01-08 DIAGNOSIS — J9601 Acute respiratory failure with hypoxia: Secondary | ICD-10-CM | POA: Diagnosis not present

## 2022-01-08 DIAGNOSIS — E662 Morbid (severe) obesity with alveolar hypoventilation: Secondary | ICD-10-CM | POA: Diagnosis not present

## 2022-01-15 ENCOUNTER — Telehealth: Payer: Self-pay | Admitting: Primary Care

## 2022-01-15 NOTE — Telephone Encounter (Signed)
.. °  Medicaid Managed Care   Unsuccessful Outreach Note  01/15/2022 Name: Lindsay [name] MRN: [id number] DOB: 1981/10/20  Referred by: Grayce Sessions, NP Reason for referral : High Risk Managed Medicaid (I called the patient today to get her rescheduled with the MM RNCM. I left my name and number on her VM.)   An unsuccessful telephone outreach was attempted today. The patient was referred to the case management team for assistance with care management and care coordination.   Follow Up Plan: The care management team will reach out to the patient again over the next 7 days.   Weston Settle Care Guide, High Risk Medicaid Managed Care Embedded Care Coordination [facility]   [facility]

## 2022-01-23 ENCOUNTER — Other Ambulatory Visit: Payer: Self-pay | Admitting: Physician Assistant

## 2022-01-23 ENCOUNTER — Other Ambulatory Visit (INDEPENDENT_AMBULATORY_CARE_PROVIDER_SITE_OTHER): Payer: Self-pay | Admitting: Primary Care

## 2022-01-23 NOTE — Telephone Encounter (Signed)
Routed to PCP to refill if appropriate.  ?

## 2022-01-26 ENCOUNTER — Other Ambulatory Visit (INDEPENDENT_AMBULATORY_CARE_PROVIDER_SITE_OTHER): Payer: Self-pay | Admitting: Primary Care

## 2022-01-26 ENCOUNTER — Telehealth: Payer: Self-pay | Admitting: Primary Care

## 2022-01-26 DIAGNOSIS — M47816 Spondylosis without myelopathy or radiculopathy, lumbar region: Secondary | ICD-10-CM

## 2022-01-26 NOTE — Telephone Encounter (Signed)
.. °  Medicaid Managed Care   Unsuccessful Outreach Note  01/26/2022 Name: Lindsay [name] MRN: [id number] DOB: [birth year]  Referred by: Grayce Sessions, NP Reason for referral : High Risk Managed Medicaid (I called the patient today to get her rescheduled with the MM RNCM. I left my name and number on her VM.)   A second unsuccessful telephone outreach was attempted today. The patient was referred to the case management team for assistance with care management and care coordination.   Follow Up Plan: The care management team will reach out to the patient again over the next 7 days.    Weston Settle Care Guide, High Risk Medicaid Managed Care Embedded Care Coordination [facility]   [facility]

## 2022-01-27 ENCOUNTER — Telehealth: Payer: Self-pay | Admitting: Pharmacist

## 2022-01-27 ENCOUNTER — Ambulatory Visit: Payer: Self-pay

## 2022-01-27 NOTE — Patient Outreach (Signed)
01/27/2022 Name: Lindsay [name] MRN: [id number] DOB: 28-Jan-1981  Referred by: Grayce Sessions, NP Reason for referral : No chief complaint on file.   Third unsuccessful telephone outreach was attempted today. The patient was referred to the case management team for assistance with care management and care coordination. The patient's primary care provider has been notified of our unsuccessful attempts to make or maintain contact with the patient. The care management team is pleased to engage with this patient at any time in the future should he/she be interested in assistance from the care management team.    Follow Up Plan: The Managed Medicaid care management team is available to follow up with the patient after provider conversation with the patient regarding recommendation for care management engagement and subsequent re-referral to the care management team.    Cheral Almas PharmD, CPP High Risk Managed [facility] [phone]

## 2022-02-03 ENCOUNTER — Ambulatory Visit: Payer: Medicaid Other

## 2022-02-05 ENCOUNTER — Other Ambulatory Visit: Payer: Self-pay

## 2022-02-05 NOTE — Patient Instructions (Signed)
Visit Information  Ms. Lanzo was given information about Medicaid Managed Care team care coordination services as a part of their Healthy [facility] Medicaid benefit. Telly D Harron verbally consented to engagement with the [facility] Managed Care team.   If you are experiencing a medical emergency, please call 911 or report to your local emergency department or urgent care.   If you have a non-emergency medical problem during routine business hours, please contact your provider's office and ask to speak with a nurse.   For questions related to your Healthy [facility] health plan, please call: [phone] or visit the homepage here: [email or web address]  If you would like to schedule transportation through your Healthy [facility] plan, please call the following number at least 2 days in advance of your appointment: [phone]  Call the [facility] Crisis Line at [phone], at any time, 24 hours a day, 7 days a week. If you are in danger or need immediate medical attention call 911.  If you would like help to quit smoking, call 1-800-QUIT-NOW (4195704138) OR Espaol: 1-855-Djelo-Ya (0-347-425-9563) o para ms informacin haga clic aqu or Text READY to 875-643 to register via text  Ms. Rodenbeck - following are the goals we discussed in your visit today:   Goals Addressed   None     The  Patient                                              has been provided with contact information for the Managed Medicaid care management team and has been advised to call with any health related questions or concerns.   Gus Puma, BSW, [facility]   [place]  High Risk Managed Medicaid Team  [phone]   Following is a copy of your plan of care:  There are no care plans that you recently modified to display for this patient.

## 2022-02-05 NOTE — Patient Outreach (Signed)
Medicaid Managed Care Social Work Note  02/05/2022 Name:  Lindsay [name] MRN:  [id number] DOB:  1981/02/27  Lindsay [name] is an 41 y.o. year old female who is [name] primary [name] of Lindsay [name].  The [name] Managed Care Coordination team was consulted for assistance with:  Level of Care Concerns  Lindsay [name] was given information about Medicaid Managed Care Coordination team services today. Lindsay [name] agreed to services and verbal consent obtained.  Engaged with [name]  for by telephone forfollow up visit in response to referral for case management and/or care coordination services.   Assessments/Interventions:  Review of past medical history, allergies, medications, health status, including review of consultants reports, laboratory and other test data, was performed as part of comprehensive evaluation and provision of chronic care management services.  SDOH: (Social Determinant of Health) assessments and interventions performed: BSW completed telephone follow up with [name] regarding PCS services. [name] stated she spoke with her PCP and she informed her she should start with PT before prescribing any PCS. [name] agreed with that process form PCP. No other resources needed at this time.  Advanced Directives Status:  Not addressed in this encounter.  Care Plan                 Allergies  Allergen Reactions   Acetaminophen Itching and Swelling    Itching of the mouth, swelling of tongue and stomach started hurting mild   Hydrazine Yellow [Tartrazine] Shortness Of Breath and Swelling    Swelling mostly noticed in legs and feet, retaining urination, shortness of breaht, and minor chest pain   Lisinopril Shortness Of Breath    Was on prinzide; had sob/chest pain on it.    Medications Reviewed Today     Reviewed by Persons, Lindsay Palmer, PA (Physician Assistant) on 12/26/21 at 1403  Med List Status: <None>   Medication Order Taking? Sig Documenting  Provider Last Dose Status Informant  Accu-Chek FastClix Lancets MISC [id number] Yes USE 1 TO CHECK GLUCOSE 4 TIMES DAILY [name], [name], Lindsay [name] Taking Active   albuterol (VENTOLIN HFA) 108 (90 Base) MCG/ACT inhaler [id number] Yes Inhale 2 puffs into the lungs every 6 (six) hours as needed for wheezing or shortness of breath. Lindsay [name] Taking Active            Med Note Lindsay [name], Lindsay [name]   Fri Dec 26, 2021 10:59 AM)    aspirin 81 MG tablet [id number] Yes Take 1 tablet (81 mg total) by mouth daily. Lindsay Demark, Lindsay [name] Taking Active Self  atorvastatin (LIPITOR) 20 MG tablet [id number] Yes Take 1 tablet (20 mg total) by mouth daily. Lindsay [name] Taking Active   baclofen (LIORESAL) 10 MG tablet [id number] Yes Take 0.5-1 tablets (5-10 mg total) by mouth 3 (three) times daily as needed for muscle spasms. Hilts, Legrand Como, Lindsay [name] Taking Active   BD PEN NEEDLE NANO 2ND GEN 32G X 4 MM MISC [id number] Yes  Provider, Historical, Lindsay [name] Taking Active Self  BD VEO INSULIN SYRINGE U/F 31G X 15/64" 1 ML MISC [id number] Yes USE  SYRINGE ONCE DAILY Lindsay [name] Taking Active Self  Blood Glucose Monitoring Suppl (ACCU-CHEK AVIVA PLUS) w/Device KIT [id number] Yes 1 each by Does not apply route as directed. Lindsay [name] Taking Active   Continuous Blood Gluc Transmit (DEXCOM G6 TRANSMITTER) MISC [id number] Yes Inject into the skin. Provider, Historical, Lindsay [name] Taking Active   diclofenac Sodium (VOLTAREN) 1 % GEL [id number] Yes Apply 4 g  topically 4 (four) times daily. Lindsay [name] Taking Active   diphenhydrAMINE (BENADRYL) 25 MG tablet [id number] Yes Take 50 mg by mouth in the morning and at bedtime. Allergies Provider, Historical, Lindsay [name] Taking Active            Med Note [facility] [name], Lindsay [name]   Wed Oct 22, 2021 11:10 AM) Taking PRN for Allergies  DULoxetine (CYMBALTA) 30 MG capsule [id number] Yes Take 1 capsule (30 mg total) by mouth daily. Lindsay [name] Taking Active   fluticasone (FLOVENT HFA) 110  MCG/ACT inhaler [id number] Yes Inhale 2 puffs into the lungs 2 (two) times daily. Lindsay [name] Taking Active   gabapentin (NEURONTIN) 300 MG capsule [id number] Yes TAKE 2 CAPSULES BY MOUTH THREE TIMES DAILY Lindsay [name] Taking Active   glucose blood (ACCU-CHEK AVIVA PLUS) test strip [id number] Yes Use as instructed to test blood sugar 4 times daily. Lindsay Pounds, [name] Taking Active   HUMULIN R U-500 KWIKPEN 500 UNIT/ML Lindsay [name] [id number] Yes Inject 75-80 Units into the skin 3 (three) times daily with meals. AM 80 units  Noon 75 units  Evening 75 units If BG above 200 add 5 units to prescribed dose Provider, Historical, Lindsay [name] Taking Active Self           Med Note Lindsay [name], ERICIA [name]   Fri Jul 11, 2021  4:07 AM) 1 dose as of 07/21, took 25 units due to not eating   hydrALAZINE (APRESOLINE) 100 MG tablet [id number] Yes Take 100 mg by mouth 3 (three) times daily. Provider, Historical, Lindsay [name] Taking Active            Med Note Lindsay [name], Lindsay [name]   Wed Oct 22, 2632 35:45 PM) Duplication Entry  hydrochlorothiazide (HYDRODIURIL) 25 MG tablet [id number] Yes Take 25 mg by mouth 3 (three) times daily. Provider, Historical, Lindsay [name] Taking Active   Insulin Pen Needle (B-D UF III MINI PEN NEEDLES) 31G X 5 MM MISC [id number] Yes Use as instructed. Monitor blood glucose levels three times per day Lindsay [name] Taking Active Self  isosorbide mononitrate (IMDUR) 30 MG 24 hr tablet [id number] Yes TAKE 1 TABLET BY MOUTH DAILY Lindsay Crome, Lindsay [name] Taking Active            Med Note Lindsay [name]   Wed Oct 22, 2021 12:18 PM) Changed to 60 mg tablet  isosorbide mononitrate (IMDUR) 60 MG 24 hr tablet [id number] Yes Take 1 tablet (60 mg total) by mouth daily. Lindsay [name] Taking Active   ketorolac Lindsay [name]) 0.5 % ophthalmic solution [id number] Yes  Provider, Historical, Lindsay [name] Taking Active            Med Note [facility], Lindsay [name]   Wed Oct 22, 2021 12:20 PM) Cataract Surgery scheduled for 11/06/21  Left Eye and 12/04/21 Right Eye  liraglutide (VICTOZA) 18 MG/3ML SOPN [id number] Yes Inject 1.8 mg into the skin at bedtime.  Provider, Historical, Lindsay [name] Taking Active Self           Med Note Chanetta Marshall Apr 01, 2020  9:48 AM)    losartan (COZAAR) 100 MG tablet [id number] Yes TAKE 1 TABLET BY MOUTH ONCE DAILY Lindsay Crome, Lindsay [name] Taking Active   Melatonin 10 MG TABS [id number] Yes Take 3 tablets by mouth at bedtime. Provider, Historical, Lindsay [name] Taking Active            Med Note [facility], Domenic Moras   Wed Oct 22, 2021  11:14 AM) Taking PRN  methocarbamol (ROBAXIN) 500 MG tablet [id number]  Take 1.5 tablets (750 mg total) by mouth every 8 (eight) hours as needed for muscle spasms. Persons, Lindsay [name], [place]  Active   metoprolol tartrate (LOPRESSOR) 50 MG tablet [id number] Yes Take 1 tablet (50 mg total) by mouth 2 (two) times daily. Lindsay [name] Taking Active   moxifloxacin (VIGAMOX) 0.5 % ophthalmic solution [id number] Yes  Provider, Historical, Lindsay [name] Taking Active            Med Note [facility], Lindsay [name]   Wed Oct 22, 2021 12:20 PM) Cataract Surgery scheduled for 11/06/21 Left Eye and 12/04/21 Right Eye  Multiple Vitamins-Minerals (MULTIVITAMIN PO) [id number] Yes Take 1 tablet by mouth daily. Provider, Historical, Lindsay [name] Taking Active Self  nitroGLYCERIN (NITROSTAT) 0.4 MG SL tablet [id number] Yes Place 1 tablet (0.4 mg total) under the tongue every 5 (five) minutes as needed for chest pain. Please keep upcoming appt in October 2022. Final Attempt Lindsay Crome, Lindsay [name] Taking Active Self  ofloxacin (OCUFLOX) 0.3 % ophthalmic solution [id number] Yes Place 1 drop into the left eye 4 (four) times daily. Provider, Historical, Lindsay [name] Taking Active   potassium chloride SA (KLOR-CON) 20 MEQ tablet [id number] Yes Take 1 tablet (20 mEq total) by mouth daily. Lindsay Crome, Lindsay [name] Taking Active   prednisoLONE acetate (PRED FORTE) 1 % ophthalmic suspension [id number] Yes Place 1 drop into the left eye 4 (four) times daily.  Provider, Historical, Lindsay [name] Taking Active   sodium chloride (OCEAN) 0.65 % nasal spray [id number] Yes Place into the nose as needed. Provider, Historical, Lindsay [name] Taking Active   torsemide (DEMADEX) 20 MG tablet [id number] Yes Take by mouth 2 (two) times daily. Dose Change: Take 2 tabs (40 mg total) twice [name] day. Provider, Historical, Lindsay [name] Taking Active Self            [name] Active Problem List   Diagnosis Date Noted   Acute respiratory failure with hypoxia ([place])    Acute on chronic diastolic heart failure (HCC)    CAP (community acquired pneumonia) 07/11/2021   Elevated liver enzymes 05/23/2020   Spondylosis of lumbar spine 06/22/2019   Pedal edema 06/22/2019   Hyperlipidemia 06/22/2019   Normocytic anemia 06/22/2019   Vitamin D deficiency 06/22/2019   Uncontrolled type 2 diabetes mellitus with diabetic neuropathy, with long-term current use of insulin 08/31/2017   OSA (obstructive sleep apnea) 11/03/2016   Coronary artery disease involving native coronary artery of native heart with angina pectoris ([place]) 09/11/2016   Diabetes mellitus with complication (HCC)    Chest pain of uncertain etiology 03/83/3383   Community acquired pneumonia 09/07/2016   MI, old    DM neuropathy, type II diabetes mellitus ([place]) 06/10/2016   Morbid obesity ([place]) 06/10/2016   Pressure ulcer 05/17/2015   Anaerobic abscess ([place])    ARF (acute renal failure) (HCC)    Septic arthritis ([place])    Essential hypertension 04/30/2015   Cellulitis 04/30/2015   Abscess of right shoulder     Conditions to be addressed/monitored per PCP order:   PCS  There are no care plans that you recently modified to display for this [name].   Follow up:  [name] agrees to Care Plan and Follow-up.  Plan: The  [name] has been provided with contact information for the Managed Medicaid care management team and has been advised to call with any health related questions or concerns.    Mickel Fuchs, BSW, [place]  [facility]  High Risk Managed Medicaid Team  [phone]

## 2022-02-08 DIAGNOSIS — J9601 Acute respiratory failure with hypoxia: Secondary | ICD-10-CM | POA: Diagnosis not present

## 2022-02-08 DIAGNOSIS — I5033 Acute on chronic diastolic (congestive) heart failure: Secondary | ICD-10-CM | POA: Diagnosis not present

## 2022-02-08 DIAGNOSIS — E662 Morbid (severe) obesity with alveolar hypoventilation: Secondary | ICD-10-CM | POA: Diagnosis not present

## 2022-02-12 ENCOUNTER — Ambulatory Visit: Payer: Medicaid Other | Attending: Primary Care

## 2022-02-12 ENCOUNTER — Other Ambulatory Visit: Payer: Self-pay

## 2022-02-12 DIAGNOSIS — G8929 Other chronic pain: Secondary | ICD-10-CM | POA: Insufficient documentation

## 2022-02-12 DIAGNOSIS — M545 Low back pain, unspecified: Secondary | ICD-10-CM | POA: Insufficient documentation

## 2022-02-12 DIAGNOSIS — M47816 Spondylosis without myelopathy or radiculopathy, lumbar region: Secondary | ICD-10-CM | POA: Diagnosis not present

## 2022-02-12 NOTE — Therapy (Signed)
OUTPATIENT PHYSICAL THERAPY THORACOLUMBAR EVALUATION   Patient Name: Lindsay [name] MRN: [id number] DOB:1980-12-26, 41 y.o., female Today's Date: 02/12/2022    Past Medical History:  Diagnosis Date   ARF (acute renal failure) (HCC) 04/2015   Asthma    Cellulitis of right upper extremity    Coronary artery disease    Diabetes mellitus    insulin dependent   Hyperlipidemia LDL goal <70    Hypertension    NSTEMI (non-ST elevated myocardial infarction) (HCC) 08/2016   Obesity    S/P angioplasty with stent 08/2016   DES to mLAD and PTCA only to 2nd diag ostium.    Past Surgical History:  Procedure Laterality Date   CARDIAC CATHETERIZATION N/A 09/07/2016   Procedure: Left Heart Cath and Coronary Angiography;  Surgeon: Lyn Records, MD;  Location: [facility] INVASIVE CV LAB;  Service: Cardiovascular;  Laterality: N/A;   CARDIAC CATHETERIZATION N/A 09/07/2016   Procedure: Coronary Stent Intervention;  Surgeon: Lyn Records, MD;  Location: [facility] INVASIVE CV LAB;  Service: Cardiovascular;  Laterality: N/A;   CARDIAC CATHETERIZATION N/A 09/07/2016   Procedure: Coronary Balloon Angioplasty;  Surgeon: Lyn Records, MD;  Location: [facility] INVASIVE CV LAB;  Service: Cardiovascular;  Laterality: N/A;   CESAREAN SECTION     IRRIGATION AND DEBRIDEMENT SHOULDER Right 04/30/2015   Procedure: IRRIGATION AND DEBRIDEMENT SHOULDER;  Surgeon: Sheral Apley, MD;  Location: [facility] OR;  Service: Orthopedics;  Laterality: Right;   IRRIGATION AND DEBRIDEMENT SHOULDER Right 05/01/2015   LEFT HEART CATH AND CORONARY ANGIOGRAPHY N/A 04/25/2020   Procedure: LEFT HEART CATH AND CORONARY ANGIOGRAPHY;  Surgeon: Lyn Records, MD;  Location: [facility] INVASIVE CV LAB;  Service: Cardiovascular;  Laterality: N/A;   LEG SURGERY     SHOULDER ARTHROSCOPY Right 04/30/2015   Procedure: ARTHROSCOPY SHOULDER;  Surgeon: Sheral Apley, MD;  Location: [facility] OR;  Service: Orthopedics;  Laterality: Right;   TONSILLECTOMY     Patient Active Problem  List   Diagnosis Date Noted   Acute respiratory failure with hypoxia (HCC)    Acute on chronic diastolic heart failure (HCC)    CAP (community acquired pneumonia) 07/11/2021   Elevated liver enzymes 05/23/2020   Spondylosis of lumbar spine 06/22/2019   Pedal edema 06/22/2019   Hyperlipidemia 06/22/2019   Normocytic anemia 06/22/2019   Vitamin D deficiency 06/22/2019   Uncontrolled type 2 diabetes mellitus with diabetic neuropathy, with long-term current use of insulin 08/31/2017   OSA (obstructive sleep apnea) 11/03/2016   Coronary artery disease involving native coronary artery of native heart with angina pectoris (HCC) 09/11/2016   Diabetes mellitus with complication (HCC)    Chest pain of uncertain etiology 09/07/2016   Community acquired pneumonia 09/07/2016   MI, old    DM neuropathy, type II diabetes mellitus (HCC) 06/10/2016   Morbid obesity (HCC) 06/10/2016   Pressure ulcer 05/17/2015   Anaerobic abscess (HCC)    ARF (acute renal failure) (HCC)    Septic arthritis (HCC)    Essential hypertension 04/30/2015   Cellulitis 04/30/2015   Abscess of right shoulder     PCP: Grayce Sessions, NP  REFERRING PROVIDER: Grayce Sessions, NP  REFERRING DIAG: [phone] (ICD-10-CM) - Spondylosis of lumbar spine   THERAPY DIAG:  Spondylosis of lumbar spine  Chronic low back pain, unspecified back pain laterality, unspecified whether sciatica present  ONSET DATE: chronic  SUBJECTIVE:  SUBJECTIVE STATEMENT: Relates a many year history of low back and B hip pain over time, recently confined to L side PERTINENT HISTORY:  Patient is a pleasant 41 year old woman with a long history of chronic back pain times many many years.  She says this first happened when she was picking up a backpack.  She has  had difficulties on and off.  She has pain in both of her posterior hip buttocks that radiates down both legs.  She denies any loss of bowel or bladder control.  She denies any weakness.  She does have some paresthesias but states these are secondary to neuropathy from her diabetes.  She is a diabetic with most recent hemoglobin A1c of 7.9.  She also has a history of cardiac issues.  Her current BMI is 66.  She has been working with weight loss through [place] but has not been lately because she said they are asking her to be more active than she can feel she can be right now.  She said that Dr. Prince Rome used to prescribe her baclofen and tramadol.  She has done physical therapy in the past   PAIN:  Are you having pain? Yes NPRS scale:7/10 Pain location: low back and Lhip Pain orientation: Left  PAIN TYPE: aching and burning Pain description: constant  Aggravating factors: upright and weightbearing positions Relieving factors: sitting and supine   PRECAUTIONS: None  WEIGHT BEARING RESTRICTIONS No  FALLS:  Has patient fallen in last 6 months? No, Number of falls: 0  LIVING ENVIRONMENT: Lives with: lives with their family Lives in: House/apartment Stairs: Yes;  Has following equipment at home: Dan Humphreys - 4 wheeled for past 2 1/2 years  OCCUPATION: not working  PLOF: Independent  PATIENT GOALS To lessen and manage my back pain   OBJECTIVE:   DIAGNOSTIC FINDINGS:  XR Lumbar Spine 2-3 Views   Result Date: 12/26/2021 2 view radiographs of her spine were reviewed today.  Study is limited secondary to habitus.  Overall well-maintained alignment.  No acute osseous fractures.  No listhesis   XR Pelvis 1-2 Views   Result Date: 12/26/2021 AP pelvis was reviewed today.  Demonstrates well-maintained alignment acute no acute osseous injuries study is limited secondary to patient habitus   PATIENT SURVEYS:  Modified Oswestry 56%   SCREENING FOR RED FLAGS: Bowel or bladder incontinence:  No  COGNITION:  Overall cognitive status: Within functional limits for tasks assessed     SENSATION:  Light touch: Appears intact   MUSCLE LENGTH: Hamstrings: Right 60 deg; Left 70 deg   POSTURE:  Rounded shoulders   PALPATION: UTA due to body habitus  LUMBARAROM/PROM  A/PROM A/PROM  02/12/2022  Flexion 75%  Extension 50%  Right lateral flexion   Left lateral flexion   Right rotation   Left rotation    (Blank rows = not tested)  LE AROM/PROM:  A/PROM Right 02/12/2022 Left 02/12/2022  Hip flexion 100 100  Hip extension    Hip abduction    Hip adduction    Hip internal rotation    Hip external rotation    Knee flexion 120 120  Knee extension 0 0  Ankle dorsiflexion WNL WNL  Ankle plantarflexion WNL WNL  Ankle inversion    Ankle eversion     (Blank rows = not tested)  LE MMT:  MMT Right 02/12/2022 Left 02/12/2022  Hip flexion 4 4  Hip extension    Hip abduction    Hip adduction    Hip internal rotation  Hip external rotation    Knee flexion 4 4  Knee extension 4 4  Ankle dorsiflexion    Ankle plantarflexion 4 4  Ankle inversion    Ankle eversion     (Blank rows = not tested)  LUMBAR SPECIAL TESTS:  Straight leg raise test: Positive and Slump test: Negative  FUNCTIONAL TESTS:  5 times sit to stand: 18s  GAIT: Distance walked: 91ft x2 Assistive device utilized: Environmental consultant - 4 wheeled Level of assistance: Modified independence Comments: limited mobility due to pain    TODAY'S TREATMENT  02/12/22 Eval and HEP   PATIENT EDUCATION:  Education details: Discussed eval findings, rehab rationale and POC and patient is in agreement  Person educated: Patient Education method: Explanation Education comprehension: verbalized understanding   HOME EXERCISE PROGRAM: Access Code: [email or web address] URL: [email or web address] Date: 02/12/2022 Prepared by: Gustavus Bryant  Program Notes Start with 5 reps at a time and work up to 15 reps, try to  perfom exercises 2x/day   Exercises Curl Up with Arms Crossed - 2 x daily - 7 x weekly - 2 sets - 15 reps Supine March - 2 x daily - 7 x weekly - 2 sets - 15 reps Active Straight Leg Raise with Quad Set - 2 x daily - 7 x weekly - 2 sets - 15 reps Sit to Stand with Armchair - 2 x daily - 7 x weekly - 1 sets - 5 reps   ASSESSMENT:  CLINICAL IMPRESSION: Patient is a 41 y.o. female who was seen today for physical therapy evaluation and treatment for chronic low back pain. Patient presents with limited trunk ROM due to pain from soft tissue restrictions, L hamstring tightness contributes to low back symptoms.  Does not appear to demo radicular involvement.   OBJECTIVE IMPAIRMENTS Abnormal gait, decreased activity tolerance, decreased endurance, decreased knowledge of condition, decreased mobility, difficulty walking, decreased ROM, decreased strength, obesity, and pain.   ACTIVITY LIMITATIONS community activity, occupation, laundry, shopping, and ADLs .   PERSONAL FACTORS Fitness, Past/current experiences, and Time since onset of injury/illness/exacerbation are also affecting patient's functional outcome.    REHAB POTENTIAL: Good  CLINICAL DECISION MAKING: Stable/uncomplicated  EVALUATION COMPLEXITY: Low   GOALS: Goals reviewed with patient? Yes  SHORT TERM GOALS: STGs=LTGs  LONG TERM GOALS:   LTG Name Target Date Goal status  1 Patient to demonstrate independence in HEP  Baseline:Access Code: [email or web address] 03/12/2022 INITIAL  2 Decrease pain to 4/10 Baseline: 7/10 03/12/2022 INITIAL  3 Increase hip flexion, knee F/E and PF strength to 4+/5 Baseline: 4/5 strength 03/12/2022 INITIAL  4 Decrease 5x STS time to 15s Baseline: 18s 03/12/2022 INITIAL  PLAN: PT FREQUENCY: 1x/week  PT DURATION: 4 weeks  PLANNED INTERVENTIONS: Therapeutic exercises, Therapeutic activity, Neuro Muscular re-education, Balance training, Gait training, Patient/Family education, Joint mobilization, and Stair  training  PLAN FOR NEXT SESSION: HEP review and expansion, gait training, core strengthening, flexibility   Hildred Laser, PT 02/12/2022, 1:09 PM   Check all possible CPT codes: 16109- Therapeutic Exercise, [phone]- Neuro Re-education, [phone] - Gait Training, and [phone] - Therapeutic Activities

## 2022-02-13 ENCOUNTER — Other Ambulatory Visit: Payer: Self-pay

## 2022-02-13 MED ORDER — HYDRALAZINE HCL 100 MG PO TABS
100.0000 mg | ORAL_TABLET | Freq: Three times a day (TID) | ORAL | 1 refills | Status: DC
  Filled 2022-02-13 – 2022-02-24 (×2): qty 90, 30d supply, fill #0
  Filled 2022-06-25: qty 90, 30d supply, fill #1

## 2022-02-15 ENCOUNTER — Other Ambulatory Visit (INDEPENDENT_AMBULATORY_CARE_PROVIDER_SITE_OTHER): Payer: Self-pay | Admitting: Primary Care

## 2022-02-16 MED ORDER — GABAPENTIN 300 MG PO CAPS: 600.0000 mg | ORAL_CAPSULE | Freq: Three times a day (TID) | ORAL | 0 refills | Status: DC

## 2022-02-16 NOTE — Telephone Encounter (Signed)
Sent to PCP to refill  

## 2022-02-18 ENCOUNTER — Ambulatory Visit: Payer: Medicaid Other

## 2022-02-24 ENCOUNTER — Other Ambulatory Visit (INDEPENDENT_AMBULATORY_CARE_PROVIDER_SITE_OTHER): Payer: Self-pay | Admitting: Primary Care

## 2022-02-25 ENCOUNTER — Ambulatory Visit: Payer: Medicaid Other | Attending: Primary Care

## 2022-02-25 ENCOUNTER — Other Ambulatory Visit: Payer: Self-pay

## 2022-02-25 ENCOUNTER — Encounter: Payer: Self-pay | Admitting: Interventional Cardiology

## 2022-02-25 ENCOUNTER — Other Ambulatory Visit (HOSPITAL_COMMUNITY): Payer: Self-pay

## 2022-02-25 ENCOUNTER — Encounter (INDEPENDENT_AMBULATORY_CARE_PROVIDER_SITE_OTHER): Payer: Self-pay | Admitting: Primary Care

## 2022-02-25 DIAGNOSIS — M47816 Spondylosis without myelopathy or radiculopathy, lumbar region: Secondary | ICD-10-CM | POA: Insufficient documentation

## 2022-02-25 DIAGNOSIS — M545 Low back pain, unspecified: Secondary | ICD-10-CM | POA: Insufficient documentation

## 2022-02-25 DIAGNOSIS — G8929 Other chronic pain: Secondary | ICD-10-CM | POA: Diagnosis not present

## 2022-02-25 MED ORDER — BD PEN NEEDLE NANO 2ND GEN 32G X 4 MM MISC
1.0000 | Freq: Three times a day (TID) | 11 refills | Status: DC
Start: 2022-02-25 — End: 2023-03-01
  Filled 2022-02-25: qty 100, 30d supply, fill #0
  Filled 2022-04-10: qty 100, 30d supply, fill #1
  Filled 2022-06-25: qty 100, 30d supply, fill #2
  Filled 2022-09-09: qty 100, 30d supply, fill #3
  Filled 2022-12-02: qty 100, 30d supply, fill #4

## 2022-02-25 NOTE — Therapy (Signed)
OUTPATIENT PHYSICAL THERAPY TREATMENT NOTE   Patient Name: Lindsay [name] MRN: [id number] DOB:Dec 29, 1980, 41 y.o., female Today's Date: 02/25/2022  PCP: Grayce Sessions, NP REFERRING PROVIDER: Grayce Sessions, NP   PT End of Session - 02/25/22 1118     Visit Number 2    Number of Visits 4    Date for PT Re-Evaluation 03/12/22    Authorization Type [employer]    Progress Note Due on Visit 4    PT Start Time 1130    PT Stop Time 1215    PT Time Calculation (min) 45 min    Activity Tolerance Patient tolerated treatment well    Behavior During Therapy [facility] for tasks assessed/performed             Past Medical History:  Diagnosis Date   ARF (acute renal failure) (HCC) 04/2015   Asthma    Cellulitis of right upper extremity    Coronary artery disease    Diabetes mellitus    insulin dependent   Hyperlipidemia LDL goal <70    Hypertension    NSTEMI (non-ST elevated myocardial infarction) (HCC) 08/2016   Obesity    S/P angioplasty with stent 08/2016   DES to mLAD and PTCA only to 2nd diag ostium.    Past Surgical History:  Procedure Laterality Date   CARDIAC CATHETERIZATION N/A 09/07/2016   Procedure: Left Heart Cath and Coronary Angiography;  Surgeon: Lyn Records, MD;  Location: [facility] INVASIVE CV LAB;  Service: Cardiovascular;  Laterality: N/A;   CARDIAC CATHETERIZATION N/A 09/07/2016   Procedure: Coronary Stent Intervention;  Surgeon: Lyn Records, MD;  Location: [facility] INVASIVE CV LAB;  Service: Cardiovascular;  Laterality: N/A;   CARDIAC CATHETERIZATION N/A 09/07/2016   Procedure: Coronary Balloon Angioplasty;  Surgeon: Lyn Records, MD;  Location: [facility] INVASIVE CV LAB;  Service: Cardiovascular;  Laterality: N/A;   CESAREAN SECTION     IRRIGATION AND DEBRIDEMENT SHOULDER Right 04/30/2015   Procedure: IRRIGATION AND DEBRIDEMENT SHOULDER;  Surgeon: Sheral Apley, MD;  Location: [facility] OR;  Service: Orthopedics;  Laterality: Right;   IRRIGATION AND DEBRIDEMENT  SHOULDER Right 05/01/2015   LEFT HEART CATH AND CORONARY ANGIOGRAPHY N/A 04/25/2020   Procedure: LEFT HEART CATH AND CORONARY ANGIOGRAPHY;  Surgeon: Lyn Records, MD;  Location: [facility] INVASIVE CV LAB;  Service: Cardiovascular;  Laterality: N/A;   LEG SURGERY     SHOULDER ARTHROSCOPY Right 04/30/2015   Procedure: ARTHROSCOPY SHOULDER;  Surgeon: Sheral Apley, MD;  Location: [facility] OR;  Service: Orthopedics;  Laterality: Right;   TONSILLECTOMY     Patient Active Problem List   Diagnosis Date Noted   Acute respiratory failure with hypoxia (HCC)    Acute on chronic diastolic heart failure (HCC)    CAP (community acquired pneumonia) 07/11/2021   Elevated liver enzymes 05/23/2020   Spondylosis of lumbar spine 06/22/2019   Pedal edema 06/22/2019   Hyperlipidemia 06/22/2019   Normocytic anemia 06/22/2019   Vitamin D deficiency 06/22/2019   Uncontrolled type 2 diabetes mellitus with diabetic neuropathy, with long-term current use of insulin 08/31/2017   OSA (obstructive sleep apnea) 11/03/2016   Coronary artery disease involving native coronary artery of native heart with angina pectoris (HCC) 09/11/2016   Diabetes mellitus with complication (HCC)    Chest pain of uncertain etiology 09/07/2016   Community acquired pneumonia 09/07/2016   MI, old    DM neuropathy, type II diabetes mellitus (HCC) 06/10/2016   Morbid obesity (HCC) 06/10/2016   Pressure  ulcer 05/17/2015   Anaerobic abscess (HCC)    ARF (acute renal failure) (HCC)    Septic arthritis (HCC)    Essential hypertension 04/30/2015   Cellulitis 04/30/2015   Abscess of right shoulder     REFERRING DIAG: M47.816 (ICD-10-CM) - Spondylosis of lumbar spine   THERAPY DIAG: Chronic low back pain, unspecified back pain laterality, unspecified whether sciatica present   PERTINENT HISTORY: Patient is a pleasant 41 year old woman with a long history of chronic back pain times many many years.  She says this first happened when she was picking  up a backpack.  She has had difficulties on and off.  She has pain in both of her posterior hip buttocks that radiates down both legs.  She denies any loss of bowel or bladder control.  She denies any weakness.  She does have some paresthesias but states these are secondary to neuropathy from her diabetes.  She is a diabetic with most recent hemoglobin A1c of 7.9.  She also has a history of cardiac issues.  Her current BMI is 66.  She has been working with weight loss through [place] but has not been lately because she said they are asking her to be more active than she can feel she can be right now.  She said that Dr. Prince Rome used to prescribe her baclofen and tramadol.  She has done physical therapy in the past   PRECAUTIONS: None  SUBJECTIVE: Feels back pain is less and more manageable.  Has been working on HEP   PAIN:  Are you having pain? Yes NPRS scale: 8/10 Pain location: L hip     OBJECTIVE:    DIAGNOSTIC FINDINGS:  XR Lumbar Spine 2-3 Views   Result Date: 12/26/2021 2 view radiographs of her spine were reviewed today.  Study is limited secondary to habitus.  Overall well-maintained alignment.  No acute osseous fractures.  No listhesis   XR Pelvis 1-2 Views   Result Date: 12/26/2021 AP pelvis was reviewed today.  Demonstrates well-maintained alignment acute no acute osseous injuries study is limited secondary to patient habitus    PATIENT SURVEYS:  Modified Oswestry 56%    SCREENING FOR RED FLAGS: Bowel or bladder incontinence: No   COGNITION:          Overall cognitive status: Within functional limits for tasks assessed                        SENSATION:          Light touch: Appears intact           MUSCLE LENGTH: Hamstrings: Right 60 deg; Left 70 deg     POSTURE:  Rounded shoulders    PALPATION: UTA due to body habitus   LUMBARAROM/PROM   A/PROM A/PROM  02/12/2022  Flexion 75%  Extension 50%  Right lateral flexion    Left lateral flexion    Right rotation     Left rotation     (Blank rows = not tested)   LE AROM/PROM:   A/PROM Right 02/12/2022 Left 02/12/2022  Hip flexion 100 100  Hip extension      Hip abduction      Hip adduction      Hip internal rotation      Hip external rotation      Knee flexion 120 120  Knee extension 0 0  Ankle dorsiflexion WNL WNL  Ankle plantarflexion WNL WNL  Ankle inversion      Ankle  eversion       (Blank rows = not tested)   LE MMT:   MMT Right 02/12/2022 Left 02/12/2022  Hip flexion 4 4  Hip extension      Hip abduction      Hip adduction      Hip internal rotation      Hip external rotation      Knee flexion 4 4  Knee extension 4 4  Ankle dorsiflexion      Ankle plantarflexion 4 4  Ankle inversion      Ankle eversion       (Blank rows = not tested)   LUMBAR SPECIAL TESTS:  Straight leg raise test: Positive and Slump test: Negative   FUNCTIONAL TESTS:  5 times sit to stand: 18s   GAIT: Distance walked: 70ft x2 Assistive device utilized: Environmental consultant - 4 wheeled Level of assistance: Modified independence Comments: limited mobility due to pain       TODAY'S TREATMENT  [facility] Adult PT Treatment:                                                DATE: 02/25/22 Therapeutic Exercise: Curl ups x15 Supine marching, alt. 15/15 Bridge 15x SLR 15/15 Figure 4 stretch L 30xx3 SKTC L 30sx3 Open book B 10x SL clams B 15/15 Seated LAQs 15/15 alternating 5x STS with OH reach   PATIENT EDUCATION:  Education details: Discussed eval findings, rehab rationale and POC and patient is in agreement  Person educated: Patient Education method: Explanation Education comprehension: verbalized understanding     HOME EXERCISE PROGRAM: Access Code: [email or web address] URL: [email or web address] Date: 02/12/2022 Prepared by: Gustavus Bryant   Program Notes Start with 5 reps at a time and work up to 15 reps, try to perfom exercises 2x/day     Exercises Curl Up with Arms Crossed - 2 x daily - 7 x  weekly - 2 sets - 15 reps Supine March - 2 x daily - 7 x weekly - 2 sets - 15 reps Active Straight Leg Raise with Quad Set - 2 x daily - 7 x weekly - 2 sets - 15 reps Sit to Stand with Armchair - 2 x daily - 7 x weekly - 1 sets - 5 reps     ASSESSMENT:   CLINICAL IMPRESSION: Low back pain has been less overall, today pain localized to L hip region.  Reviewed HEP for accuracy, added LE and trunk strengthening tasks as tolerated.  Continued course of low back and hip strengthening, postural training and body mechanics training to minimize low back stresses      OBJECTIVE IMPAIRMENTS Abnormal gait, decreased activity tolerance, decreased endurance, decreased knowledge of condition, decreased mobility, difficulty walking, decreased ROM, decreased strength, obesity, and pain.    ACTIVITY LIMITATIONS community activity, occupation, laundry, shopping, and ADLs .    PERSONAL FACTORS Fitness, Past/current experiences, and Time since onset of injury/illness/exacerbation are also affecting patient's functional outcome.      REHAB POTENTIAL: Good   CLINICAL DECISION MAKING: Stable/uncomplicated   EVALUATION COMPLEXITY: Low     GOALS: Goals reviewed with patient? Yes   SHORT TERM GOALS: STGs=LTGs   LONG TERM GOALS:    LTG Name Target Date Goal status  1 Patient to demonstrate independence in HEP  Baseline:Access Code: [email or web address] 03/12/2022 INITIAL  2 Decrease pain to 4/10 Baseline: 7/10 03/12/2022  INITIAL  3 Increase hip flexion, knee F/E and PF strength to 4+/5 Baseline: 4/5 strength 03/12/2022 INITIAL  4 Decrease 5x STS time to 15s Baseline: 18s 03/12/2022 INITIAL  PLAN: PT FREQUENCY: 1x/week   PT DURATION: 4 weeks   PLANNED INTERVENTIONS: Therapeutic exercises, Therapeutic activity, Neuro Muscular re-education, Balance training, Gait training, Patient/Family education, Joint mobilization, and Stair training   PLAN FOR NEXT SESSION: Continue hip and core strengthening, emphasize  flexibility and trunk stretching     Hildred Laser, PT 02/25/2022, 11:27 AM

## 2022-02-25 NOTE — Telephone Encounter (Signed)
Sent to PCP ?

## 2022-02-27 ENCOUNTER — Other Ambulatory Visit (INDEPENDENT_AMBULATORY_CARE_PROVIDER_SITE_OTHER): Payer: Self-pay | Admitting: Primary Care

## 2022-02-27 DIAGNOSIS — Z760 Encounter for issue of repeat prescription: Secondary | ICD-10-CM

## 2022-02-27 DIAGNOSIS — R200 Anesthesia of skin: Secondary | ICD-10-CM

## 2022-02-27 MED ORDER — DULOXETINE HCL 30 MG PO CPEP: 30.0000 mg | ORAL_CAPSULE | Freq: Every day | ORAL | 1 refills | Status: DC

## 2022-03-04 ENCOUNTER — Other Ambulatory Visit: Payer: Self-pay

## 2022-03-04 ENCOUNTER — Ambulatory Visit: Payer: Medicaid Other

## 2022-03-04 DIAGNOSIS — G8929 Other chronic pain: Secondary | ICD-10-CM

## 2022-03-04 DIAGNOSIS — M47816 Spondylosis without myelopathy or radiculopathy, lumbar region: Secondary | ICD-10-CM | POA: Diagnosis not present

## 2022-03-04 DIAGNOSIS — M545 Low back pain, unspecified: Secondary | ICD-10-CM | POA: Diagnosis not present

## 2022-03-04 NOTE — Therapy (Addendum)
?OUTPATIENT PHYSICAL THERAPY TREATMENT NOTE/DC SUMMARY ? ? ?Patient Name: Lindsay [name] ?MRN: [id number] ?DOB:[birth year], 41 y.o., female ?Today's Date: 03/04/2022 ? ?PCP: [name], NP ?REFERRING PROVIDER: [name], NP ?PHYSICAL THERAPY DISCHARGE SUMMARY ? ?Visits from Start of Care: 3 ? ?Current functional level related to goals / functional outcomes: ?Goals partially met ?  ?Remaining deficits: ?Pain ?  ?Education / Equipment: ?HEP  ? ?Patient agrees to discharge. Patient goals were partially met. Patient is being discharged due to not returning since the last visit.  ? PT End of Session - 03/04/22 1214   ? ? Visit Number 3   ? Number of Visits 4   ? Date for PT Re-Evaluation 03/12/22   ? Authorization Type [employer]   ? Progress Note Due on Visit 4   ? PT Start Time 1215   ? PT Stop Time 8295   ? PT Time Calculation (min) 40 min   ? Activity Tolerance Patient tolerated treatment well   ? Behavior During Therapy [facility] for tasks assessed/performed   ? ?  ?  ? ?  ? ? ?Past Medical History:  ?Diagnosis Date  ? ARF (acute renal failure) ([place]) 04/2015  ? Asthma   ? Cellulitis of right upper extremity   ? Coronary artery disease   ? Diabetes mellitus   ? insulin dependent  ? Hyperlipidemia LDL goal <70   ? Hypertension   ? NSTEMI (non-ST elevated myocardial infarction) ([place]) 08/2016  ? Obesity   ? S/P angioplasty with stent 08/2016  ? DES to mLAD and PTCA only to 2nd diag ostium.   ? ?Past Surgical History:  ?Procedure Laterality Date  ? CARDIAC CATHETERIZATION N/A 09/07/2016  ? Procedure: Left Heart Cath and Coronary Angiography;  Surgeon: [name], MD;  Location: [place] CV LAB;  Service: Cardiovascular;  Laterality: N/A;  ? CARDIAC CATHETERIZATION N/A 09/07/2016  ? Procedure: Coronary Stent Intervention;  Surgeon: [name], MD;  Location: [place] CV LAB;  Service: Cardiovascular;  Laterality: N/A;  ? CARDIAC CATHETERIZATION N/A 09/07/2016  ? Procedure: Coronary Balloon  Angioplasty;  Surgeon: [name], MD;  Location: [place] CV LAB;  Service: Cardiovascular;  Laterality: N/A;  ? CESAREAN SECTION    ? IRRIGATION AND DEBRIDEMENT SHOULDER Right 04/30/2015  ? Procedure: IRRIGATION AND DEBRIDEMENT SHOULDER;  Surgeon: Renette Butters, MD;  Location: [place];  Service: Orthopedics;  Laterality: Right;  ? IRRIGATION AND DEBRIDEMENT SHOULDER Right 05/01/2015  ? LEFT HEART CATH AND CORONARY ANGIOGRAPHY N/A 04/25/2020  ? Procedure: LEFT HEART CATH AND CORONARY ANGIOGRAPHY;  Surgeon: [name], MD;  Location: [place] CV LAB;  Service: Cardiovascular;  Laterality: N/A;  ? LEG SURGERY    ? SHOULDER ARTHROSCOPY Right 04/30/2015  ? Procedure: ARTHROSCOPY SHOULDER;  Surgeon: Renette Butters, MD;  Location: [employer];  Service: Orthopedics;  Laterality: Right;  ? TONSILLECTOMY    ? ?Patient Active Problem List  ? Diagnosis Date Noted  ? Acute respiratory failure with hypoxia ([place])   ? Acute on chronic diastolic heart failure ([place])   ? CAP (community acquired pneumonia) 07/11/2021  ? Elevated liver enzymes 05/23/2020  ? Spondylosis of lumbar spine 06/22/2019  ? Pedal edema 06/22/2019  ? Hyperlipidemia 06/22/2019  ? Normocytic anemia 06/22/2019  ? Vitamin D deficiency 06/22/2019  ? Uncontrolled type 2 diabetes mellitus with diabetic neuropathy, with long-term current use of insulin 08/31/2017  ? OSA (obstructive sleep apnea) 11/03/2016  ? Coronary artery  disease involving native coronary artery of native heart with angina pectoris ([place]) 09/11/2016  ? Diabetes mellitus with complication ([place])   ? Chest pain of uncertain etiology 62/83/1517  ? Community acquired pneumonia 09/07/2016  ? MI, old   ? DM neuropathy, type II diabetes mellitus ([place]) 06/10/2016  ? Morbid obesity ([place]) 06/10/2016  ? Pressure ulcer 05/17/2015  ? Anaerobic abscess ([place])   ? ARF (acute renal failure) ([place])   ? Septic arthritis ([place])   ? Essential hypertension 04/30/2015  ? Cellulitis 04/30/2015  ? Abscess of right  shoulder   ? ? ?REFERRING DIAG: M47.816 (ICD-10-CM) - Spondylosis of lumbar spine  ? ?THERAPY DIAG: Chronic low back pain, unspecified back pain laterality, unspecified whether sciatica present ? ? ?PERTINENT HISTORY: Patient is a pleasant 41 year old woman with a long history of chronic back pain times many many years.  She says this first happened when she was picking up a backpack.  She has had difficulties on and off.  She has pain in both of her posterior hip buttocks that radiates down both legs.  She denies any loss of bowel or bladder control.  She denies any weakness.  She does have some paresthesias but states these are secondary to neuropathy from her diabetes.  She is a diabetic with most recent hemoglobin A1c of 7.9.  She also has a history of cardiac issues.  Her current BMI is 66.  She has been working with weight loss through [place] but has not been lately because she said they are asking her to be more active than she can feel she can be right now.  She said that Dr. Junius Roads used to prescribe her baclofen and tramadol.  She has done physical therapy in the past  ? ?PRECAUTIONS: None ? ?SUBJECTIVE: No changes to note, will f/u up with PCP tomorrow, has been consistent with HEP.  Reports soreness in lumbosacral and hip region following last session which resolved within 48 hours ? ?PAIN:  ?Are you having pain? Yes ?NPRS scale: 8/10 ?Pain location: L hip ? ? ? ? ?OBJECTIVE:  ?  ?DIAGNOSTIC FINDINGS:  ?XR Lumbar Spine 2-3 Views ?  ?Result Date: 12/26/2021 ?2 view radiographs of her spine were reviewed today.  Study is limited secondary to habitus.  Overall well-maintained alignment.  No acute osseous fractures.  No listhesis ?  ?XR Pelvis 1-2 Views ?  ?Result Date: 12/26/2021 ?AP pelvis was reviewed today.  Demonstrates well-maintained alignment acute no acute osseous injuries study is limited secondary to patient habitus  ?  ?PATIENT SURVEYS:  ?Modified Oswestry 56%  ?  ?SCREENING FOR RED FLAGS: ?Bowel or  bladder incontinence: No ?  ?COGNITION: ?         Overall cognitive status: Within functional limits for tasks assessed              ?          ?SENSATION: ?         Light touch: Appears intact ?          ?MUSCLE LENGTH: ?Hamstrings: Right 60 deg; Left 70 deg ?  ?  ?POSTURE:  ?Rounded shoulders  ?  ?PALPATION: ?UTA due to body habitus ?  ?LUMBARAROM/PROM ?  ?A/PROM A/PROM  ?02/12/2022  ?Flexion 75%  ?Extension 50%  ?Right lateral flexion    ?Left lateral flexion    ?Right rotation    ?Left rotation    ? (Blank rows = not tested) ?  ?LE AROM/PROM: ?  ?A/PROM  Right ?02/12/2022 Left ?02/12/2022  ?Hip flexion 100 100  ?Hip extension      ?Hip abduction      ?Hip adduction      ?Hip internal rotation      ?Hip external rotation      ?Knee flexion 120 120  ?Knee extension 0 0  ?Ankle dorsiflexion WNL WNL  ?Ankle plantarflexion WNL WNL  ?Ankle inversion      ?Ankle eversion      ? (Blank rows = not tested) ?  ?LE MMT: ?  ?MMT Right ?02/12/2022 Left ?02/12/2022  ?Hip flexion 4 4  ?Hip extension      ?Hip abduction      ?Hip adduction      ?Hip internal rotation      ?Hip external rotation      ?Knee flexion 4 4  ?Knee extension 4 4  ?Ankle dorsiflexion      ?Ankle plantarflexion 4 4  ?Ankle inversion      ?Ankle eversion      ? (Blank rows = not tested) ?  ?LUMBAR SPECIAL TESTS:  ?Straight leg raise test: Positive and Slump test: Negative ?  ?FUNCTIONAL TESTS:  ?5 times sit to stand: 18s ?  ?GAIT: ?Distance walked: 92f x2 ?Assistive device utilized: WEnvironmental consultant- 4 wheeled ?Level of assistance: Modified independence ?Comments: limited mobility due to pain ?  ?  ?  ?[facility] TREATMENT  [facility]                                                [facility] ?Therapeutic Exercise: ?Curl ups x20 ?Supine marching, alt. 20/20 ?Bridge 20x ?SLR 20/20 ?Figure 4 stretch B 30s x3 ?SKTC B 30s x3 ?Open book B 10x ?SL clams B 20/20 ?Seated LAQs 20/20  ?5x STS with OH reach ? [facility]                                                 DATE: 02/25/22 ?Therapeutic Exercise: ?Curl ups x15 ?Supine marching, alt. 15/15 ?Bridge 15x ?SLR 15/15 ?Figure 4 stretch L 30s x3 ?SKTC L 30sx3 ?Open book B 10x ?SL clams B 15/15 ?Seated LAQs 15/15

## 2022-03-05 ENCOUNTER — Other Ambulatory Visit: Payer: Self-pay

## 2022-03-05 ENCOUNTER — Encounter (INDEPENDENT_AMBULATORY_CARE_PROVIDER_SITE_OTHER): Payer: Self-pay | Admitting: Primary Care

## 2022-03-05 ENCOUNTER — Ambulatory Visit (INDEPENDENT_AMBULATORY_CARE_PROVIDER_SITE_OTHER): Payer: Medicaid Other | Admitting: Primary Care

## 2022-03-05 VITALS — BP 135/82 | HR 78 | Temp 97.8°F | Ht 67.5 in | Wt >= 6400 oz

## 2022-03-05 DIAGNOSIS — Z0000 Encounter for general adult medical examination without abnormal findings: Secondary | ICD-10-CM

## 2022-03-05 DIAGNOSIS — Z794 Long term (current) use of insulin: Secondary | ICD-10-CM | POA: Diagnosis not present

## 2022-03-05 DIAGNOSIS — Z1231 Encounter for screening mammogram for malignant neoplasm of breast: Secondary | ICD-10-CM

## 2022-03-05 DIAGNOSIS — E782 Mixed hyperlipidemia: Secondary | ICD-10-CM

## 2022-03-05 DIAGNOSIS — E559 Vitamin D deficiency, unspecified: Secondary | ICD-10-CM | POA: Diagnosis not present

## 2022-03-05 DIAGNOSIS — M79652 Pain in left thigh: Secondary | ICD-10-CM

## 2022-03-05 DIAGNOSIS — E1140 Type 2 diabetes mellitus with diabetic neuropathy, unspecified: Secondary | ICD-10-CM | POA: Diagnosis not present

## 2022-03-05 DIAGNOSIS — I10 Essential (primary) hypertension: Secondary | ICD-10-CM

## 2022-03-05 MED ORDER — IBUPROFEN 600 MG PO TABS: 600.0000 mg | ORAL_TABLET | Freq: Three times a day (TID) | ORAL | 0 refills | Status: DC | PRN

## 2022-03-05 NOTE — Progress Notes (Signed)
? ?  ? ? [place] ? ? [name] is a 41 year old morbid obese female presents to clinic today for annual exam. She uses a Rolator walker for stability and balance. Patient is fasting for labs. ? ?Acute Concerns: ?Left upper thigh sharp pain sharp and burning lateral - aggravating factors standing and walking , alleviating factors lying down. Rates her pain 8/10 taken OTC tylenol 562m (2) ibuprofen 2070m(2), heating pain, and warm bath no relief. New onset started Saturday 02/28/22. She has physical therapy and exercising maybe a contributing factors.  ? ?Chronic Issues: ? ? ?Health Maintenance: ?Immunizations -- TDAP Up to date no influenza and declines   ?Colon Cancer Screening -- no appropriate  ?[facility]  ?PAP -- 06/03/24 ?Bone Density -- no ? ?HIV/Hep C Screening --  ? ?Past Medical History:  ?Diagnosis Date  ?? AR[place] ([place] renal failure) [place]  ?? Asthma   ?? Cellulitis of right upper extremity   ?? Coronary artery disease   ?? Diabetes mellitus   ? insulin dependent  ?? Hyperlipidemia LDL goal <70   ?? Hypertension   ?? [place] (non-ST elevated [place] infarction) [place]  ?? Obesity   ?? S/P angioplasty with stent 08/2016  ? DES to mLAD and PTCA only to 2nd diag ostium.   ? ? ?Past Surgical History:  ?Procedure Laterality Date  ?? CARDIAC CATHETERIZATION N/A 09/07/2016  ? Procedure: Left Heart Cath and Coronary Angiography;  Surgeon: HeBelva CromeMD;  [facility];  Service: Cardiovascular;  Laterality: N/A;  ?? CARDIAC CATHETERIZATION N/A 09/07/2016  ? Procedure: Coronary Stent Intervention;  Surgeon: HeBelva CromeMD;  [facility];  Service: Cardiovascular;  Laterality: N/A;  ?? CARDIAC CATHETERIZATION N/A 09/07/2016  ? Procedure: Coronary Balloon Angioplasty;  Surgeon: HeBelva CromeMD;  [facility];  Service: Cardiovascular;  Laterality: N/A;  ?? CESAREAN SECTION     ?? IRRIGATION AND DEBRIDEMENT SHOULDER Right 04/30/2015  ? Procedure: IRRIGATION AND DEBRIDEMENT SHOULDER;  Surgeon: TiRenette ButtersMD;  [facility] Service: Orthopedics;  Laterality: Right;  ?? IRRIGATION AND DEBRIDEMENT SHOULDER Right 05/01/2015  ?? LEFT HEART CATH AND CORONARY ANGIOGRAPHY N/A 04/25/2020  ? Procedure: LEFT HEART CATH AND CORONARY ANGIOGRAPHY;  Surgeon: SmBelva CromeMD;  [facility];  Service: Cardiovascular;  Laterality: N/A;  ?? LEG SURGERY    ?? SHOULDER ARTHROSCOPY Right 04/30/2015  ? Procedure: ARTHROSCOPY SHOULDER;  Surgeon: TiRenette ButtersMD;  [facility] Service: Orthopedics;  Laterality: Right;  ?? TONSILLECTOMY    ? ? ?Current Outpatient Medications on File Prior to Visit  ?Medication Sig Dispense Refill  ?? Accu-Chek FastClix Lancets MISC USE 1 TO CHECK GLUCOSE 4 TIMES DAILY 102 each 2  ?? albuterol (VENTOLIN HFA) 108 (90 Base) MCG/ACT inhaler Inhale 2 puffs into the lungs every 6 (six) hours as needed for wheezing or shortness of breath. 18 g 1  ?? aspirin 81 MG tablet Take 1 tablet (81 mg total) by mouth daily. 30 tablet 11  ?? atorvastatin (LIPITOR) 20 MG tablet Take 1 tablet (20 mg total) by mouth daily. 90 tablet 3  ?? baclofen (LIORESAL) 10 MG tablet Take 0.5-1 tablets (5-10 mg total) by mouth 3 (three) times daily as needed for muscle spasms. 30 tablet 3  ?? BD PEN NEEDLE NANO 2ND GEN 32G X 4  MM MISC Use to inject into the skin in the morning, at noon, and at bedtime as directed. 100 each 11  ?? BD VEO INSULIN SYRINGE U/F 31G X 15/64" 1 ML MISC USE  SYRINGE ONCE DAILY 100 each 1  ?? Blood Glucose Monitoring Suppl (ACCU-CHEK AVIVA PLUS) w/Device KIT 1 each by Does not apply route as directed. 1 kit 0  ?? Continuous Blood Gluc Transmit (DEXCOM G6 TRANSMITTER) MISC Inject into the skin.    ?? diclofenac Sodium (VOLTAREN) 1 % GEL Apply 4 g topically 4 (four) times daily. 350 g 1  ?? diphenhydrAMINE (BENADRYL) 25 MG tablet Take 50 mg by mouth in the  morning and at bedtime. Allergies    ?? DULoxetine (CYMBALTA) 30 MG capsule Take 1 capsule (30 mg total) by mouth daily. 90 capsule 1  ?? fluticasone (FLOVENT HFA) 110 MCG/ACT inhaler Inhale 2 puffs into the lungs 2 (two) times daily. 1 each 12  ?? gabapentin (NEURONTIN) 300 MG capsule Take 2 capsules (600 mg total) by mouth 3 (three) times daily. 180 capsule 0  ?? glucose blood (ACCU-CHEK AVIVA PLUS) test strip Use as instructed to test blood sugar 4 times daily. 100 each 12  ?? HUMULIN R U-500 KWIKPEN 500 UNIT/ML kwikpen Inject 75-80 Units into the skin 3 (three) times daily with meals. AM 80 units  ?Noon 75 units  ?Evening 75 units ?If BG above 200 add 5 units to prescribed dose    ?? hydrALAZINE (APRESOLINE) 100 MG tablet Take 1 tablet (100 mg total) by mouth 3 (three) times daily. 90 tablet 1  ?? hydrochlorothiazide (HYDRODIURIL) 25 MG tablet Take 25 mg by mouth 3 (three) times daily.    ?? Insulin Pen Needle (B-D UF III MINI PEN NEEDLES) 31G X 5 MM MISC Use as instructed. Monitor blood glucose levels three times per day 90 each 1  ?? isosorbide mononitrate (IMDUR) 30 MG 24 hr tablet TAKE 1 TABLET BY MOUTH DAILY 30 tablet 6  ?? isosorbide mononitrate (IMDUR) 60 MG 24 hr tablet Take 1 tablet (60 mg total) by mouth daily. 30 tablet 1  ?? ketorolac (ACULAR) 0.5 % ophthalmic solution     ?? liraglutide (VICTOZA) 18 MG/3ML SOPN Inject 1.8 mg into the skin at bedtime.     ?? losartan (COZAAR) 100 MG tablet TAKE 1 TABLET BY MOUTH ONCE DAILY 90 tablet 3  ?? Melatonin 10 MG TABS Take 3 tablets by mouth at bedtime.    ?? methocarbamol (ROBAXIN) 500 MG tablet Take 1.5 tablets (750 mg total) by mouth every 8 (eight) hours as needed for muscle spasms. 30 tablet 0  ?? metoprolol tartrate (LOPRESSOR) 50 MG tablet Take 1 tablet (50 mg total) by mouth 2 (two) times daily. 60 tablet 1  ?? moxifloxacin (VIGAMOX) 0.5 % ophthalmic solution     ?? Multiple Vitamins-Minerals (MULTIVITAMIN PO) Take 1 tablet by mouth daily.    ??  nitroGLYCERIN (NITROSTAT) 0.4 MG SL tablet Place 1 tablet (0.4 mg total) under the tongue every 5 (five) minutes as needed for chest pain. Please keep upcoming appt in October 2022. Final Attempt 25 tablet 2  ?? ofloxacin (OCUFLOX) 0.3 % ophthalmic solution Place 1 drop into the left eye 4 (four) times daily.    ?? potassium chloride SA (KLOR-CON) 20 MEQ tablet Take 1 tablet (20 mEq total) by mouth daily. 90 tablet 3  ?? prednisoLONE acetate (PRED FORTE) 1 % ophthalmic suspension Place 1 drop into the left eye 4 (four) times daily.    ??  sodium chloride (OCEAN) 0.65 % nasal spray Place into the nose as needed.    ?? torsemide (DEMADEX) 20 MG tablet Take by mouth 2 (two) times daily. Dose Change: Take 2 tabs (40 mg total) twice a day.    ? ?No current facility-administered medications on file prior to visit.  ? ? ?Allergies  ?Allergen Reactions  ?? Acetaminophen Itching and Swelling  ?  Itching of the mouth, swelling of tongue and stomach started hurting ?mild  ?? Hydrazine Yellow [Tartrazine] Shortness Of Breath and Swelling  ?  Swelling mostly noticed in legs and feet, retaining urination, shortness of breaht, and minor chest pain  ?? Lisinopril Shortness Of Breath  ?  Was on prinzide; had sob/chest pain on it.  ? ? ?Family History  ?Problem Relation Age of Onset  ?? Diabetes Mother   ?? Hypertension Mother   ?? Heart disease Mother   ?? Diabetes Father   ?? Heart disease Father   ?? Stroke Maternal Grandmother   ?? Cancer Maternal Grandmother   ? ? ?Social History  ? ?Socioeconomic History  ?? Marital status: Single  ?  Spouse name: Not on file  ?? Number of children: Not on file  ?? Years of education: Not on file  ?? Highest education level: Not on file  ?Occupational History  ?? Not on file  ?Tobacco Use  ?? Smoking status: Former  ?  Packs/day: 0.25  ?  Years: 2.00  ?  Pack years: 0.50  ?  Types: Cigarettes  ?? Smokeless tobacco: Never  ?Vaping Use  ?? Vaping Use: Never used  ?Substance and Sexual Activity  ??  Alcohol use: Not Currently  ?  Comment: socially  ?? Drug use: No  ?? Sexual activity: Not on file  ?Other Topics Concern  ?? Not on file  ?Social History Narrative  ?? Not on file  ? ?Social Determinants o

## 2022-03-06 LAB — CBC WITH DIFFERENTIAL/PLATELET
Basophils Absolute: 0 10*3/uL (ref 0.0–0.2)
Basos: 1 %
EOS (ABSOLUTE): 0.2 10*3/uL (ref 0.0–0.4)
Eos: 3 %
Hematocrit: 34.8 % (ref 34.0–46.6)
Hemoglobin: 11.3 g/dL (ref 11.1–15.9)
Immature Grans (Abs): 0 10*3/uL (ref 0.0–0.1)
Immature Granulocytes: 0 %
Lymphocytes Absolute: 2.5 10*3/uL (ref 0.7–3.1)
Lymphs: 38 %
MCH: 26.3 pg — ABNORMAL LOW (ref 26.6–33.0)
MCHC: 32.5 g/dL (ref 31.5–35.7)
MCV: 81 fL (ref 79–97)
Monocytes Absolute: 0.6 10*3/uL (ref 0.1–0.9)
Monocytes: 10 %
Neutrophils Absolute: 3.2 10*3/uL (ref 1.4–7.0)
Neutrophils: 48 %
Platelets: 232 10*3/uL (ref 150–450)
RBC: 4.3 x10E6/uL (ref 3.77–5.28)
RDW: 13.7 % (ref 11.7–15.4)
WBC: 6.6 10*3/uL (ref 3.4–10.8)

## 2022-03-06 LAB — CMP14+EGFR
ALT: 14 IU/L (ref 0–32)
AST: 24 IU/L (ref 0–40)
Albumin/Globulin Ratio: 1.2 (ref 1.2–2.2)
Albumin: 3.8 g/dL (ref 3.8–4.8)
Alkaline Phosphatase: 84 IU/L (ref 44–121)
BUN/Creatinine Ratio: 9 (ref 9–23)
BUN: 9 mg/dL (ref 6–24)
Bilirubin Total: 0.4 mg/dL (ref 0.0–1.2)
CO2: 22 mmol/L (ref 20–29)
Calcium: 8.4 mg/dL — ABNORMAL LOW (ref 8.7–10.2)
Chloride: 100 mmol/L (ref 96–106)
Creatinine, Ser: 1.03 mg/dL — ABNORMAL HIGH (ref 0.57–1.00)
Globulin, Total: 3.2 g/dL (ref 1.5–4.5)
Glucose: 87 mg/dL (ref 70–99)
Potassium: 4.2 mmol/L (ref 3.5–5.2)
Sodium: 141 mmol/L (ref 134–144)
Total Protein: 7 g/dL (ref 6.0–8.5)
eGFR: 70 mL/min/{1.73_m2} (ref >59)

## 2022-03-06 LAB — LIPID PANEL
Chol/HDL Ratio: 3.1 ratio (ref 0.0–4.4)
Cholesterol, Total: 131 mg/dL (ref 100–199)
HDL: 42 mg/dL (ref >39)
LDL Chol Calc (NIH): 66 mg/dL (ref 0–99)
Triglycerides: 129 mg/dL (ref 0–149)
VLDL Cholesterol Cal: 23 mg/dL (ref 5–40)

## 2022-03-06 LAB — VITAMIN D 25 HYDROXY (VIT D DEFICIENCY, FRACTURES): Vit D, 25-Hydroxy: 23.8 ng/mL — ABNORMAL LOW (ref 30.0–100.0)

## 2022-03-08 DIAGNOSIS — J9601 Acute respiratory failure with hypoxia: Secondary | ICD-10-CM | POA: Diagnosis not present

## 2022-03-08 DIAGNOSIS — E662 Morbid (severe) obesity with alveolar hypoventilation: Secondary | ICD-10-CM | POA: Diagnosis not present

## 2022-03-08 DIAGNOSIS — I5033 Acute on chronic diastolic (congestive) heart failure: Secondary | ICD-10-CM | POA: Diagnosis not present

## 2022-03-09 ENCOUNTER — Other Ambulatory Visit (INDEPENDENT_AMBULATORY_CARE_PROVIDER_SITE_OTHER): Payer: Self-pay | Admitting: Primary Care

## 2022-03-10 ENCOUNTER — Ambulatory Visit (INDEPENDENT_AMBULATORY_CARE_PROVIDER_SITE_OTHER): Payer: Medicaid Other | Admitting: Podiatry

## 2022-03-10 ENCOUNTER — Telehealth: Payer: Self-pay | Admitting: Primary Care

## 2022-03-10 ENCOUNTER — Other Ambulatory Visit: Payer: Medicaid Other

## 2022-03-10 DIAGNOSIS — Z91199 Patient's noncompliance with other medical treatment and regimen due to unspecified reason: Secondary | ICD-10-CM

## 2022-03-10 NOTE — Patient Outreach (Signed)
Care Coordination ? ?03/10/2022 ? ?Joy D Nagele ?1981/04/26 ?[id number] ? ?03/10/2022 ?Name: Lindsay [name] MRN: [id number] DOB: Nov 21, 1981 ? ?Referred by: Grayce Sessions, NP ?Reason for referral : High Risk Managed Medicaid (RNCM Notification of Case Closure from High Risk Managed Medicaid program with [facility].) ? ? ?Third unsuccessful telephone outreach was attempted today. The patient was referred to the case management team for assistance with care management and care coordination. The patient's primary care provider has been notified of our unsuccessful attempts to make or maintain contact with the patient. The care management team is pleased to engage with this patient at any time in the future should he/she be interested in assistance from the care management team.  Case Closure from the High Risk Managed Medicaid program of [facility] is effective today 03/10/2022. ? ?Follow Up Plan: The Managed Medicaid care management team is available to follow up with the patient after provider conversation with the patient regarding recommendation for care management engagement and subsequent re-referral to the care management team.   ? ? ?Virgina Norfolk RN, BSN ?Community Care Coordinator ?  [facility] ?Mobile: [phone]  ?

## 2022-03-10 NOTE — Telephone Encounter (Signed)
.. ?  Medicaid Managed Care  ? ?Unsuccessful Outreach Note ? ?03/10/2022 ?Name: Lindsay [name] MRN: [id number] DOB: 18-Oct-1981 ? ?Referred by: [name], NP ?Reason for referral : High Risk Managed Medicaid (Called the patient today to get her rescheduled with the MM RNCM. She did not answer and there was no message on the VM.) ? ? ?Third unsuccessful telephone outreach was attempted today. The patient was referred to the case management team for assistance with care management and care coordination. The patient's primary care provider has been notified of our unsuccessful attempts to make or maintain contact with the patient. The care management team is pleased to engage with this patient at any time in the future should he/she be interested in assistance from the care management team.  ? ?Follow Up Plan: We have been unable to make contact with the patient for follow up. The care management team is available to follow up with the patient after provider conversation with the patient regarding recommendation for care management engagement and subsequent re-referral to the care management team.  ? ?Reita Chard ?Care Guide, High Risk Medicaid Managed Care ?Embedded Care Coordination ?[place]  ? ?

## 2022-03-10 NOTE — Progress Notes (Signed)
   Complete physical exam  Patient: Lindsay [name]   DOB: 10/10/1999   41 y.o. Female  MRN: [id number]  Subjective:    No chief complaint on file.   Lindsay [name] is a 41 y.o. female who presents today for a complete physical exam. She reports consuming a {diet types:17450} diet. {types:19826} She generally feels {DESC; WELL/FAIRLY WELL/POORLY:18703}. She reports sleeping {DESC; WELL/FAIRLY WELL/POORLY:18703}. She {does/does not:200015} have additional problems to discuss today.    Most recent fall risk assessment:    06/17/2022   10:42 AM  Fall Risk   Falls in the past year? 0  Number falls in past yr: 0  Injury with Fall? 0  Risk for fall due to : No Fall Risks  Follow up Falls evaluation completed     Most recent depression screenings:    06/17/2022   10:42 AM 05/08/2021   10:46 AM  PHQ 2/9 Scores  PHQ - 2 Score 0 0  PHQ- 9 Score 5     {VISON DENTAL STD PSA (Optional):27386}  {History (Optional):23778}  Patient Care Team: Jessup, Joy, NP as PCP - General (Nurse Practitioner)   Outpatient Medications Prior to Visit  Medication Sig   fluticasone (FLONASE) 50 MCG/ACT nasal spray Place 2 sprays into both nostrils in the morning and at bedtime. After 7 days, reduce to once daily.   norgestimate-ethinyl estradiol (SPRINTEC 28) 0.25-35 MG-MCG tablet Take 1 tablet by mouth daily.   Nystatin POWD Apply liberally to affected area 2 times per day   spironolactone (ALDACTONE) 100 MG tablet Take 1 tablet (100 mg total) by mouth daily.   No facility-administered medications prior to visit.    ROS        Objective:     There were no vitals taken for this visit. {Vitals History (Optional):23777}  Physical Exam   No results found for any visits on 07/23/22. {Show previous labs (optional):23779}    Assessment & Plan:    Routine Health Maintenance and Physical Exam  Immunization History  Administered Date(s) Administered   DTaP 12/24/1999, 02/19/2000,  04/29/2000, 01/13/2001, 07/29/2004   Hepatitis A 05/25/2008, 05/31/2009   Hepatitis B 10/11/1999, 11/18/1999, 04/29/2000   HiB (PRP-OMP) 12/24/1999, 02/19/2000, 04/29/2000, 01/13/2001   IPV 12/24/1999, 02/19/2000, 10/18/2000, 07/29/2004   Influenza,inj,Quad PF,6+ Mos 08/31/2014   Influenza-Unspecified 11/30/2012   MMR 10/18/2001, 07/29/2004   Meningococcal Polysaccharide 05/30/2012   Pneumococcal Conjugate-13 01/13/2001   Pneumococcal-Unspecified 04/29/2000, 07/13/2000   Tdap 05/30/2012   Varicella 10/18/2000, 05/25/2008    Health Maintenance  Topic Date Due   HIV Screening  Never done   Hepatitis C Screening  Never done   INFLUENZA VACCINE  07/21/2022   PAP-Cervical Cytology Screening  07/23/2022 (Originally 10/09/2020)   PAP SMEAR-Modifier  07/23/2022 (Originally 10/09/2020)   TETANUS/TDAP  07/23/2022 (Originally 05/30/2022)   HPV VACCINES  Discontinued   COVID-19 Vaccine  Discontinued    Discussed health benefits of physical activity, and encouraged her to engage in regular exercise appropriate for her age and condition.  Problem List Items Addressed This Visit   None Visit Diagnoses     Annual physical exam    -  Primary   Cervical cancer screening       Need for Tdap vaccination          No follow-ups on file.     Joy Jessup, NP   

## 2022-03-10 NOTE — Patient Instructions (Signed)
Lindsay [name] ,  ? ?The [facility] Managed Care Team is available to provide assistance to you with your healthcare needs at no cost and as a benefit of your [facility] Health plan.  ? ?We have been unable to reach you on 3 separate attempts. The care management team is available to assist with your healthcare needs at any time. Please do not hesitate to contact me at the number below.  ? ?This is notification of Case Closure for the High Risk Managed Medicaid program with [facility] effective today 03/10/2022 due to being unable to maintain contact with the patient. ? ?Thank you,  ? ?Virgina Norfolk RN, BSN ?Community Care Coordinator ?[place]  [facility] ?Mobile: [phone]  ?

## 2022-03-16 DIAGNOSIS — E1165 Type 2 diabetes mellitus with hyperglycemia: Secondary | ICD-10-CM | POA: Diagnosis not present

## 2022-04-08 DIAGNOSIS — J9601 Acute respiratory failure with hypoxia: Secondary | ICD-10-CM | POA: Diagnosis not present

## 2022-04-08 DIAGNOSIS — E662 Morbid (severe) obesity with alveolar hypoventilation: Secondary | ICD-10-CM | POA: Diagnosis not present

## 2022-04-08 DIAGNOSIS — I5033 Acute on chronic diastolic (congestive) heart failure: Secondary | ICD-10-CM | POA: Diagnosis not present

## 2022-04-10 ENCOUNTER — Other Ambulatory Visit (INDEPENDENT_AMBULATORY_CARE_PROVIDER_SITE_OTHER): Payer: Self-pay | Admitting: Primary Care

## 2022-04-10 ENCOUNTER — Other Ambulatory Visit (HOSPITAL_COMMUNITY): Payer: Self-pay

## 2022-04-10 NOTE — Telephone Encounter (Signed)
Routed to pcp

## 2022-04-14 ENCOUNTER — Encounter (INDEPENDENT_AMBULATORY_CARE_PROVIDER_SITE_OTHER): Payer: Self-pay

## 2022-04-14 ENCOUNTER — Ambulatory Visit: Payer: Medicaid Other

## 2022-04-15 NOTE — Telephone Encounter (Signed)
Request routed to PCP ?

## 2022-04-21 ENCOUNTER — Ambulatory Visit
Admission: RE | Admit: 2022-04-21 | Discharge: 2022-04-21 | Disposition: A | Discharge status: Home / Self Care | Payer: Medicaid Other | Source: Ambulatory Visit | Attending: Primary Care | Admitting: Primary Care

## 2022-04-21 DIAGNOSIS — Z1231 Encounter for screening mammogram for malignant neoplasm of breast: Secondary | ICD-10-CM | POA: Diagnosis not present

## 2022-04-23 ENCOUNTER — Other Ambulatory Visit: Payer: Self-pay | Admitting: Primary Care

## 2022-04-23 DIAGNOSIS — R928 Other abnormal and inconclusive findings on diagnostic imaging of breast: Secondary | ICD-10-CM

## 2022-04-29 ENCOUNTER — Ambulatory Visit: Payer: Medicaid Other

## 2022-04-29 ENCOUNTER — Ambulatory Visit
Admission: RE | Admit: 2022-04-29 | Discharge: 2022-04-29 | Disposition: A | Discharge status: Home / Self Care | Payer: Medicaid Other | Source: Ambulatory Visit | Attending: Primary Care | Admitting: Primary Care

## 2022-04-29 DIAGNOSIS — R928 Other abnormal and inconclusive findings on diagnostic imaging of breast: Secondary | ICD-10-CM

## 2022-04-29 DIAGNOSIS — N6489 Other specified disorders of breast: Secondary | ICD-10-CM | POA: Diagnosis not present

## 2022-05-04 ENCOUNTER — Other Ambulatory Visit: Payer: Medicaid Other

## 2022-05-04 NOTE — Progress Notes (Deleted)
Cardiology Office Note:    Date:  05/04/2022   ID:  Lindsay [name], DOB 11/26/1981, MRN [id number]  PCP:  [name], NP  Cardiologist:  Sinclair Grooms, MD   Referring MD: [name], NP   No chief complaint on file.   History of Present Illness:    Lindsay [name] is a 41 y.o. female with a hx of diabetes mellitus type 2, essential hypertension, anemia of chronic disease, morbid obesity, obstructive sleep apnea, NSTEMI September 2017, with pk troponin 6.64 with cath and PCI with DES to mLAD and PTCA only to 2nd diag ostium, May 2021 re-cath revealed moderate atherosclerosis but no high-grade obstruction with patent LAD stent..  EF with cath 35-45% but Echo was 55-60%.  Recent hospital admission for respiratory failure and found to have a component of acute on chronic combined systolic and diastolic heart failure.   ***  Past Medical History:  Diagnosis Date   ARF (acute renal failure) ([place]) 04/2015   Asthma    Cellulitis of right upper extremity    Coronary artery disease    Diabetes mellitus    insulin dependent   Hyperlipidemia LDL goal <70    Hypertension    NSTEMI (non-ST elevated myocardial infarction) ([place]) 08/2016   Obesity    S/P angioplasty with stent 08/2016   DES to mLAD and PTCA only to 2nd diag ostium.     Past Surgical History:  Procedure Laterality Date   CARDIAC CATHETERIZATION N/A 09/07/2016   Procedure: Left Heart Cath and Coronary Angiography;  Surgeon: [name], MD;  Location: [place] CV LAB;  Service: Cardiovascular;  Laterality: N/A;   CARDIAC CATHETERIZATION N/A 09/07/2016   Procedure: Coronary Stent Intervention;  Surgeon: [name], MD;  Location: [place] [facility] LAB;  Service: Cardiovascular;  Laterality: N/A;   CARDIAC CATHETERIZATION N/A 09/07/2016   Procedure: Coronary Balloon Angioplasty;  Surgeon: [name], MD;  Location: [place] CV LAB;  Service: Cardiovascular;  Laterality: N/A;   CESAREAN SECTION      IRRIGATION AND DEBRIDEMENT SHOULDER Right 04/30/2015   Procedure: IRRIGATION AND DEBRIDEMENT SHOULDER;  Surgeon: Renette Butters, MD;  Location: [place];  Service: Orthopedics;  Laterality: Right;   IRRIGATION AND DEBRIDEMENT SHOULDER Right 05/01/2015   LEFT HEART CATH AND CORONARY ANGIOGRAPHY N/A 04/25/2020   Procedure: LEFT HEART CATH AND CORONARY ANGIOGRAPHY;  Surgeon: [name], MD;  Location: [place] CV LAB;  Service: Cardiovascular;  Laterality: N/A;   LEG SURGERY     SHOULDER ARTHROSCOPY Right 04/30/2015   Procedure: ARTHROSCOPY SHOULDER;  Surgeon: Renette Butters, MD;  Location: [place];  Service: Orthopedics;  Laterality: Right;   TONSILLECTOMY      Current Medications: No outpatient medications have been marked as taking for the 05/06/22 encounter (Appointment) with [name], MD.     Allergies:   Acetaminophen, Hydrazine yellow [tartrazine], and Lisinopril   Social History   Socioeconomic History   Marital status: Single    Spouse name: Not on file   Number of children: Not on file   Years of education: Not on file   Highest education level: Not on file  Occupational History   Not on file  Tobacco Use   Smoking status: Former    Packs/day: 0.25    Years: 2.00    Pack years: 0.50    Types: Cigarettes   Smokeless tobacco: Never  Vaping Use   Vaping Use: Never used  Substance and Sexual Activity   Alcohol use: Not Currently    Comment: socially   Drug use: No   Sexual activity: Not on file  Other Topics Concern   Not on file  Social History Narrative   Not on file   Social Determinants of Health   Financial Resource Strain: Low Risk    Difficulty of Paying Living Expenses: Not hard at all  Food Insecurity: No Food Insecurity   Worried About Running Out of Food in the Last Year: Never true   Ran Out of Food in the Last Year: Never true  Transportation Needs: No Transportation Needs   Lack of Transportation (Medical): No   Lack of  Transportation (Non-Medical): No  Physical Activity: Insufficiently Active   Days of Exercise per Week: 3 days   Minutes of Exercise per Session: 20 min  Stress: Stress Concern Present   Feeling of Stress : To some extent  Social Connections: Unknown   Frequency of Communication with Friends and Family: More than three times a week   Frequency of Social Gatherings with Friends and Family: More than three times a week   Attends Religious Services: More than 4 times per year   Active Member of [employer] or Organizations: Yes   Attends Banker Meetings: 1 to 4 times per year   Marital Status: Not on file     Family History: The patient's family history includes Breast cancer in her cousin and cousin; Cancer in her maternal grandmother; Diabetes in her father and mother; Heart disease in her father and mother; Hypertension in her mother; Stroke in her maternal grandmother.  ROS:   Please see the history of present illness.    *** All other systems reviewed and are negative.  EKGs/Labs/Other Studies Reviewed:    The following studies were reviewed today:  2 D Doppler ECHOCARDIOGRAM 07/12/2021: IMPRESSIONS   1. Left ventricular ejection fraction, by estimation, is 65 to 70%. The  left ventricle has normal function. Left ventricular endocardial border  not optimally defined to evaluate regional wall motion. There is mild left  ventricular hypertrophy. Left  ventricular diastolic parameters were normal.   2. Right ventricular systolic function is normal. The right ventricular  size is normal. There is normal pulmonary artery systolic pressure. The  estimated right ventricular systolic pressure is 18.7 mmHg.   3. The mitral valve is grossly normal. Trivial mitral valve  regurgitation.   4. The aortic valve is tricuspid. Aortic valve regurgitation is not  visualized.   5. The inferior vena cava is normal in size with greater than 50%  respiratory variability, suggesting right  atrial pressure of 3 mmHg.   EKG:  EKG ***  Recent Labs: 07/17/2021: B Natriuretic Peptide 190.7 07/31/2021: Magnesium 1.8 08/22/2021: NT-Pro BNP 113 03/05/2022: ALT 14; BUN 9; Creatinine, Ser 1.03; Hemoglobin 11.3; Platelets 232; Potassium 4.2; Sodium 141  Recent Lipid Panel    Component Value Date/Time   CHOL 131 03/05/2022 1619   TRIG 129 03/05/2022 1619   HDL 42 03/05/2022 1619   CHOLHDL 3.1 03/05/2022 1619   CHOLHDL 3.4 07/23/2021 0559   VLDL 12 07/23/2021 0559   LDLCALC 66 03/05/2022 1619    Physical Exam:    VS:  There were no vitals taken for this visit.    Wt Readings from Last 3 Encounters:  03/05/22 (!) 421 lb 6.4 oz (191.1 kg)  12/26/21 (!) 430 lb (195 kg)  11/05/21 (!) 420 lb 9.6 oz (190.8 kg)  GEN: ***. No acute distress HEENT: Normal NECK: No JVD. LYMPHATICS: No lymphadenopathy CARDIAC: *** murmur. RRR *** gallop, or edema. VASCULAR: *** Normal Pulses. No bruits. RESPIRATORY:  Clear to auscultation without rales, wheezing or rhonchi  ABDOMEN: Soft, non-tender, non-distended, No pulsatile mass, MUSCULOSKELETAL: No deformity  SKIN: Warm and dry NEUROLOGIC:  Alert and oriented x 3 PSYCHIATRIC:  Normal affect   ASSESSMENT:    1. Coronary artery disease involving native coronary artery of native heart with angina pectoris ([address])   2. Acute on chronic diastolic heart failure ([place])   3. Morbid obesity ([place])   4. OSA (obstructive sleep apnea)   5. Mixed hyperlipidemia   6. Essential hypertension    PLAN:    In order of problems listed above:  ***   Medication Adjustments/Labs and Tests Ordered: Current medicines are reviewed at length with the patient today.  Concerns regarding medicines are outlined above.  No orders of the defined types were placed in this encounter.  No orders of the defined types were placed in this encounter.   There are no Patient Instructions on file for this visit.   Signed, Sinclair Grooms, MD  05/04/2022 5:57 PM     [place]

## 2022-05-06 ENCOUNTER — Ambulatory Visit: Payer: Medicaid Other | Admitting: Interventional Cardiology

## 2022-05-06 DIAGNOSIS — E782 Mixed hyperlipidemia: Secondary | ICD-10-CM

## 2022-05-06 DIAGNOSIS — I10 Essential (primary) hypertension: Secondary | ICD-10-CM

## 2022-05-06 DIAGNOSIS — G4733 Obstructive sleep apnea (adult) (pediatric): Secondary | ICD-10-CM

## 2022-05-06 DIAGNOSIS — I5033 Acute on chronic diastolic (congestive) heart failure: Secondary | ICD-10-CM

## 2022-05-06 DIAGNOSIS — I25119 Atherosclerotic heart disease of native coronary artery with unspecified angina pectoris: Secondary | ICD-10-CM

## 2022-05-08 DIAGNOSIS — J9610 Chronic respiratory failure, unspecified whether with hypoxia or hypercapnia: Secondary | ICD-10-CM | POA: Diagnosis not present

## 2022-05-08 DIAGNOSIS — E662 Morbid (severe) obesity with alveolar hypoventilation: Secondary | ICD-10-CM | POA: Diagnosis not present

## 2022-05-21 ENCOUNTER — Other Ambulatory Visit (INDEPENDENT_AMBULATORY_CARE_PROVIDER_SITE_OTHER): Payer: Self-pay | Admitting: Primary Care

## 2022-05-21 DIAGNOSIS — E118 Type 2 diabetes mellitus with unspecified complications: Secondary | ICD-10-CM

## 2022-05-21 MED ORDER — GABAPENTIN 300 MG PO CAPS: 600.0000 mg | ORAL_CAPSULE | Freq: Three times a day (TID) | ORAL | 0 refills | Status: DC

## 2022-06-08 DIAGNOSIS — E662 Morbid (severe) obesity with alveolar hypoventilation: Secondary | ICD-10-CM | POA: Diagnosis not present

## 2022-06-08 DIAGNOSIS — J9610 Chronic respiratory failure, unspecified whether with hypoxia or hypercapnia: Secondary | ICD-10-CM | POA: Diagnosis not present

## 2022-06-25 ENCOUNTER — Other Ambulatory Visit (HOSPITAL_COMMUNITY): Payer: Self-pay

## 2022-06-25 DIAGNOSIS — H5213 Myopia, bilateral: Secondary | ICD-10-CM | POA: Diagnosis not present

## 2022-06-26 ENCOUNTER — Other Ambulatory Visit (HOSPITAL_COMMUNITY): Payer: Self-pay

## 2022-06-29 ENCOUNTER — Other Ambulatory Visit (HOSPITAL_COMMUNITY): Payer: Self-pay

## 2022-07-08 DIAGNOSIS — E662 Morbid (severe) obesity with alveolar hypoventilation: Secondary | ICD-10-CM | POA: Diagnosis not present

## 2022-07-08 DIAGNOSIS — J9610 Chronic respiratory failure, unspecified whether with hypoxia or hypercapnia: Secondary | ICD-10-CM | POA: Diagnosis not present

## 2022-07-14 DIAGNOSIS — H52223 Regular astigmatism, bilateral: Secondary | ICD-10-CM | POA: Diagnosis not present

## 2022-07-14 DIAGNOSIS — H5213 Myopia, bilateral: Secondary | ICD-10-CM | POA: Diagnosis not present

## 2022-07-24 ENCOUNTER — Other Ambulatory Visit (INDEPENDENT_AMBULATORY_CARE_PROVIDER_SITE_OTHER): Payer: Self-pay | Admitting: Primary Care

## 2022-07-24 DIAGNOSIS — M79652 Pain in left thigh: Secondary | ICD-10-CM

## 2022-07-24 DIAGNOSIS — E118 Type 2 diabetes mellitus with unspecified complications: Secondary | ICD-10-CM

## 2022-07-24 MED ORDER — IBUPROFEN 600 MG PO TABS: 600.0000 mg | ORAL_TABLET | Freq: Three times a day (TID) | ORAL | 0 refills | Status: DC | PRN

## 2022-07-24 NOTE — Telephone Encounter (Signed)
Routed to PCP 

## 2022-07-25 ENCOUNTER — Other Ambulatory Visit: Payer: Self-pay | Admitting: Interventional Cardiology

## 2022-07-25 DIAGNOSIS — I10 Essential (primary) hypertension: Secondary | ICD-10-CM

## 2022-08-14 ENCOUNTER — Emergency Department (HOSPITAL_COMMUNITY): Payer: Medicaid Other

## 2022-08-14 ENCOUNTER — Inpatient Hospital Stay (HOSPITAL_COMMUNITY)
Admission: EM | Admit: 2022-08-14 | Discharge: 2022-08-23 | DRG: 292 | Disposition: A | Discharge status: Home / Self Care | Payer: Medicaid Other | Attending: Internal Medicine | Admitting: Internal Medicine

## 2022-08-14 ENCOUNTER — Encounter (HOSPITAL_COMMUNITY): Payer: Self-pay | Admitting: *Deleted

## 2022-08-14 ENCOUNTER — Other Ambulatory Visit: Payer: Self-pay

## 2022-08-14 DIAGNOSIS — Z20822 Contact with and (suspected) exposure to covid-19: Secondary | ICD-10-CM | POA: Diagnosis present

## 2022-08-14 DIAGNOSIS — R509 Fever, unspecified: Secondary | ICD-10-CM | POA: Diagnosis present

## 2022-08-14 DIAGNOSIS — R0600 Dyspnea, unspecified: Principal | ICD-10-CM

## 2022-08-14 DIAGNOSIS — I5033 Acute on chronic diastolic (congestive) heart failure: Principal | ICD-10-CM | POA: Diagnosis present

## 2022-08-14 DIAGNOSIS — I110 Hypertensive heart disease with heart failure: Secondary | ICD-10-CM | POA: Diagnosis present

## 2022-08-14 DIAGNOSIS — G8929 Other chronic pain: Secondary | ICD-10-CM | POA: Diagnosis present

## 2022-08-14 DIAGNOSIS — E785 Hyperlipidemia, unspecified: Secondary | ICD-10-CM | POA: Diagnosis present

## 2022-08-14 DIAGNOSIS — R079 Chest pain, unspecified: Secondary | ICD-10-CM | POA: Diagnosis not present

## 2022-08-14 DIAGNOSIS — Z888 Allergy status to other drugs, medicaments and biological substances status: Secondary | ICD-10-CM

## 2022-08-14 DIAGNOSIS — Z79899 Other long term (current) drug therapy: Secondary | ICD-10-CM

## 2022-08-14 DIAGNOSIS — R0602 Shortness of breath: Secondary | ICD-10-CM | POA: Diagnosis not present

## 2022-08-14 DIAGNOSIS — J45909 Unspecified asthma, uncomplicated: Secondary | ICD-10-CM | POA: Diagnosis present

## 2022-08-14 DIAGNOSIS — N179 Acute kidney failure, unspecified: Secondary | ICD-10-CM | POA: Diagnosis present

## 2022-08-14 DIAGNOSIS — Z886 Allergy status to analgesic agent status: Secondary | ICD-10-CM

## 2022-08-14 DIAGNOSIS — Z9109 Other allergy status, other than to drugs and biological substances: Secondary | ICD-10-CM

## 2022-08-14 DIAGNOSIS — I10 Essential (primary) hypertension: Secondary | ICD-10-CM | POA: Diagnosis present

## 2022-08-14 DIAGNOSIS — I25119 Atherosclerotic heart disease of native coronary artery with unspecified angina pectoris: Secondary | ICD-10-CM | POA: Diagnosis present

## 2022-08-14 DIAGNOSIS — Z955 Presence of coronary angioplasty implant and graft: Secondary | ICD-10-CM

## 2022-08-14 DIAGNOSIS — I2510 Atherosclerotic heart disease of native coronary artery without angina pectoris: Secondary | ICD-10-CM | POA: Diagnosis present

## 2022-08-14 DIAGNOSIS — Z87891 Personal history of nicotine dependence: Secondary | ICD-10-CM

## 2022-08-14 DIAGNOSIS — Z803 Family history of malignant neoplasm of breast: Secondary | ICD-10-CM

## 2022-08-14 DIAGNOSIS — J811 Chronic pulmonary edema: Secondary | ICD-10-CM | POA: Diagnosis not present

## 2022-08-14 DIAGNOSIS — F39 Unspecified mood [affective] disorder: Secondary | ICD-10-CM | POA: Diagnosis present

## 2022-08-14 DIAGNOSIS — M549 Dorsalgia, unspecified: Secondary | ICD-10-CM | POA: Diagnosis present

## 2022-08-14 DIAGNOSIS — Z794 Long term (current) use of insulin: Secondary | ICD-10-CM

## 2022-08-14 DIAGNOSIS — Z6841 Body Mass Index (BMI) 40.0 and over, adult: Secondary | ICD-10-CM

## 2022-08-14 DIAGNOSIS — Z833 Family history of diabetes mellitus: Secondary | ICD-10-CM

## 2022-08-14 DIAGNOSIS — E1165 Type 2 diabetes mellitus with hyperglycemia: Secondary | ICD-10-CM | POA: Diagnosis present

## 2022-08-14 DIAGNOSIS — Z7982 Long term (current) use of aspirin: Secondary | ICD-10-CM

## 2022-08-14 DIAGNOSIS — Z823 Family history of stroke: Secondary | ICD-10-CM

## 2022-08-14 DIAGNOSIS — I252 Old myocardial infarction: Secondary | ICD-10-CM

## 2022-08-14 DIAGNOSIS — Z8249 Family history of ischemic heart disease and other diseases of the circulatory system: Secondary | ICD-10-CM

## 2022-08-14 LAB — CBC
HCT: 33.8 % — ABNORMAL LOW (ref 36.0–46.0)
Hemoglobin: 10.5 g/dL — ABNORMAL LOW (ref 12.0–15.0)
MCH: 26.7 pg (ref 26.0–34.0)
MCHC: 31.1 g/dL (ref 30.0–36.0)
MCV: 86 fL (ref 80.0–100.0)
Platelets: 227 10*3/uL (ref 150–400)
RBC: 3.93 MIL/uL (ref 3.87–5.11)
RDW: 14.5 % (ref 11.5–15.5)
WBC: 7.8 10*3/uL (ref 4.0–10.5)
nRBC: 0 % (ref 0.0–0.2)

## 2022-08-14 LAB — I-STAT BETA HCG BLOOD, ED (MC, WL, AP ONLY): I-stat hCG, quantitative: <5 m[IU]/mL (ref <5)

## 2022-08-14 MED ORDER — IBUPROFEN 400 MG PO TABS
400.0000 mg | ORAL_TABLET | Freq: Once | ORAL | Status: AC | PRN
  Administered 2022-08-14: 400 mg via ORAL
  Filled 2022-08-14: qty 1

## 2022-08-14 NOTE — ED Triage Notes (Addendum)
Pt states onset of central cp last night, feels like she has more fluid in her legs recently. Shortness of breath. Taking her medications as prescribed. Onset of chills today. Lung sounds are clear, tachypnea, mild lower bilateral lower extremity swelling.

## 2022-08-15 ENCOUNTER — Encounter (HOSPITAL_COMMUNITY): Payer: Self-pay

## 2022-08-15 ENCOUNTER — Inpatient Hospital Stay (HOSPITAL_COMMUNITY): Payer: Medicaid Other

## 2022-08-15 DIAGNOSIS — Z20822 Contact with and (suspected) exposure to covid-19: Secondary | ICD-10-CM | POA: Diagnosis not present

## 2022-08-15 DIAGNOSIS — E785 Hyperlipidemia, unspecified: Secondary | ICD-10-CM | POA: Diagnosis not present

## 2022-08-15 DIAGNOSIS — M5441 Lumbago with sciatica, right side: Secondary | ICD-10-CM | POA: Diagnosis not present

## 2022-08-15 DIAGNOSIS — R0602 Shortness of breath: Secondary | ICD-10-CM | POA: Diagnosis not present

## 2022-08-15 DIAGNOSIS — I25119 Atherosclerotic heart disease of native coronary artery with unspecified angina pectoris: Secondary | ICD-10-CM | POA: Diagnosis not present

## 2022-08-15 DIAGNOSIS — N179 Acute kidney failure, unspecified: Secondary | ICD-10-CM | POA: Diagnosis not present

## 2022-08-15 DIAGNOSIS — E1165 Type 2 diabetes mellitus with hyperglycemia: Secondary | ICD-10-CM | POA: Diagnosis not present

## 2022-08-15 DIAGNOSIS — Z888 Allergy status to other drugs, medicaments and biological substances status: Secondary | ICD-10-CM | POA: Diagnosis not present

## 2022-08-15 DIAGNOSIS — R0600 Dyspnea, unspecified: Secondary | ICD-10-CM | POA: Diagnosis not present

## 2022-08-15 DIAGNOSIS — G8929 Other chronic pain: Secondary | ICD-10-CM | POA: Diagnosis not present

## 2022-08-15 DIAGNOSIS — Z6841 Body Mass Index (BMI) 40.0 and over, adult: Secondary | ICD-10-CM | POA: Diagnosis not present

## 2022-08-15 DIAGNOSIS — I2510 Atherosclerotic heart disease of native coronary artery without angina pectoris: Secondary | ICD-10-CM | POA: Diagnosis not present

## 2022-08-15 DIAGNOSIS — I5021 Acute systolic (congestive) heart failure: Secondary | ICD-10-CM

## 2022-08-15 DIAGNOSIS — M549 Dorsalgia, unspecified: Secondary | ICD-10-CM | POA: Diagnosis not present

## 2022-08-15 DIAGNOSIS — J810 Acute pulmonary edema: Secondary | ICD-10-CM | POA: Diagnosis not present

## 2022-08-15 DIAGNOSIS — Z823 Family history of stroke: Secondary | ICD-10-CM | POA: Diagnosis not present

## 2022-08-15 DIAGNOSIS — I5033 Acute on chronic diastolic (congestive) heart failure: Secondary | ICD-10-CM | POA: Diagnosis not present

## 2022-08-15 DIAGNOSIS — I10 Essential (primary) hypertension: Secondary | ICD-10-CM | POA: Diagnosis not present

## 2022-08-15 DIAGNOSIS — J811 Chronic pulmonary edema: Secondary | ICD-10-CM | POA: Diagnosis not present

## 2022-08-15 DIAGNOSIS — Z955 Presence of coronary angioplasty implant and graft: Secondary | ICD-10-CM | POA: Diagnosis not present

## 2022-08-15 DIAGNOSIS — M545 Low back pain, unspecified: Secondary | ICD-10-CM | POA: Diagnosis not present

## 2022-08-15 DIAGNOSIS — Z794 Long term (current) use of insulin: Secondary | ICD-10-CM | POA: Diagnosis not present

## 2022-08-15 DIAGNOSIS — F39 Unspecified mood [affective] disorder: Secondary | ICD-10-CM | POA: Diagnosis not present

## 2022-08-15 DIAGNOSIS — Z8249 Family history of ischemic heart disease and other diseases of the circulatory system: Secondary | ICD-10-CM | POA: Diagnosis not present

## 2022-08-15 DIAGNOSIS — I110 Hypertensive heart disease with heart failure: Secondary | ICD-10-CM | POA: Diagnosis not present

## 2022-08-15 DIAGNOSIS — Z803 Family history of malignant neoplasm of breast: Secondary | ICD-10-CM | POA: Diagnosis not present

## 2022-08-15 DIAGNOSIS — R509 Fever, unspecified: Secondary | ICD-10-CM | POA: Diagnosis not present

## 2022-08-15 DIAGNOSIS — J45909 Unspecified asthma, uncomplicated: Secondary | ICD-10-CM | POA: Diagnosis not present

## 2022-08-15 DIAGNOSIS — I252 Old myocardial infarction: Secondary | ICD-10-CM | POA: Diagnosis not present

## 2022-08-15 DIAGNOSIS — Z833 Family history of diabetes mellitus: Secondary | ICD-10-CM | POA: Diagnosis not present

## 2022-08-15 DIAGNOSIS — Z9109 Other allergy status, other than to drugs and biological substances: Secondary | ICD-10-CM | POA: Diagnosis not present

## 2022-08-15 DIAGNOSIS — R079 Chest pain, unspecified: Secondary | ICD-10-CM | POA: Diagnosis not present

## 2022-08-15 DIAGNOSIS — Z886 Allergy status to analgesic agent status: Secondary | ICD-10-CM | POA: Diagnosis not present

## 2022-08-15 DIAGNOSIS — Z87891 Personal history of nicotine dependence: Secondary | ICD-10-CM | POA: Diagnosis not present

## 2022-08-15 LAB — SARS CORONAVIRUS 2 BY RT PCR: SARS Coronavirus 2 by RT PCR: NEGATIVE

## 2022-08-15 LAB — BASIC METABOLIC PANEL
Anion gap: 9 (ref 5–15)
BUN: 22 mg/dL — ABNORMAL HIGH (ref 6–20)
CO2: 29 mmol/L (ref 22–32)
Calcium: 9.4 mg/dL (ref 8.9–10.3)
Chloride: 104 mmol/L (ref 98–111)
Creatinine, Ser: 1.16 mg/dL — ABNORMAL HIGH (ref 0.44–1.00)
GFR, Estimated: >60 mL/min (ref >60)
Glucose, Bld: 101 mg/dL — ABNORMAL HIGH (ref 70–99)
Potassium: 4.1 mmol/L (ref 3.5–5.1)
Sodium: 142 mmol/L (ref 135–145)

## 2022-08-15 LAB — BRAIN NATRIURETIC PEPTIDE: B Natriuretic Peptide: 77.3 pg/mL (ref 0.0–100.0)

## 2022-08-15 LAB — URINALYSIS, ROUTINE W REFLEX MICROSCOPIC
Bacteria, UA: NONE SEEN
Bilirubin Urine: NEGATIVE
Glucose, UA: NEGATIVE mg/dL
Hgb urine dipstick: NEGATIVE
Ketones, ur: NEGATIVE mg/dL
Leukocytes,Ua: NEGATIVE
Nitrite: NEGATIVE
Protein, ur: 30 mg/dL — AB
Specific Gravity, Urine: 1.008 (ref 1.005–1.030)
pH: 6 (ref 5.0–8.0)

## 2022-08-15 LAB — CBG MONITORING, ED
Glucose-Capillary: 75 mg/dL (ref 70–99)
Glucose-Capillary: 144 mg/dL — ABNORMAL HIGH (ref 70–99)
Glucose-Capillary: 152 mg/dL — ABNORMAL HIGH (ref 70–99)
Glucose-Capillary: 232 mg/dL — ABNORMAL HIGH (ref 70–99)

## 2022-08-15 LAB — HEMOGLOBIN A1C
Hgb A1c MFr Bld: 10 % — ABNORMAL HIGH (ref 4.8–5.6)
Mean Plasma Glucose: 240.3 mg/dL

## 2022-08-15 LAB — ECHOCARDIOGRAM COMPLETE
Area-P 1/2: 3.77 cm2
Height: 67 in
S' Lateral: 3.1 cm
Weight: 6720 oz

## 2022-08-15 LAB — GLUCOSE, CAPILLARY: Glucose-Capillary: 224 mg/dL — ABNORMAL HIGH (ref 70–99)

## 2022-08-15 LAB — HIV ANTIBODY (ROUTINE TESTING W REFLEX): HIV Screen 4th Generation wRfx: NONREACTIVE

## 2022-08-15 LAB — TROPONIN I (HIGH SENSITIVITY)
Troponin I (High Sensitivity): 8 ng/L (ref <18)
Troponin I (High Sensitivity): 8 ng/L (ref <18)

## 2022-08-15 LAB — LACTIC ACID, PLASMA
Lactic Acid, Venous: 1.8 mmol/L (ref 0.5–1.9)
Lactic Acid, Venous: 2 mmol/L (ref 0.5–1.9)

## 2022-08-15 MED ORDER — FUROSEMIDE 10 MG/ML IJ SOLN
40.0000 mg | Freq: Once | INTRAMUSCULAR | Status: AC
  Administered 2022-08-15: 40 mg via INTRAVENOUS
  Filled 2022-08-15: qty 4

## 2022-08-15 MED ORDER — METOPROLOL TARTRATE 50 MG PO TABS
50.0000 mg | ORAL_TABLET | Freq: Two times a day (BID) | ORAL | Status: DC
  Administered 2022-08-15 – 2022-08-23 (×17): 50 mg via ORAL
  Filled 2022-08-15 (×8): qty 1
  Filled 2022-08-15: qty 2
  Filled 2022-08-15 (×8): qty 1

## 2022-08-15 MED ORDER — SODIUM CHLORIDE 0.9 % IV SOLN
2.0000 g | Freq: Once | INTRAVENOUS | Status: AC
  Administered 2022-08-15: 2 g via INTRAVENOUS
  Filled 2022-08-15: qty 20

## 2022-08-15 MED ORDER — BISACODYL 5 MG PO TBEC: 5.0000 mg | DELAYED_RELEASE_TABLET | Freq: Every day | ORAL | Status: DC | PRN

## 2022-08-15 MED ORDER — HYDRALAZINE HCL 20 MG/ML IJ SOLN: 5.0000 mg | INTRAMUSCULAR | Status: DC | PRN

## 2022-08-15 MED ORDER — SODIUM CHLORIDE 0.9 % IV SOLN
500.0000 mg | INTRAVENOUS | Status: DC
  Administered 2022-08-15: 500 mg via INTRAVENOUS
  Filled 2022-08-15: qty 5

## 2022-08-15 MED ORDER — INSULIN ASPART 100 UNIT/ML IJ SOLN
0.0000 [IU] | Freq: Every day | INTRAMUSCULAR | Status: DC
  Administered 2022-08-15: 2 [IU] via SUBCUTANEOUS

## 2022-08-15 MED ORDER — MORPHINE SULFATE (PF) 2 MG/ML IV SOLN
2.0000 mg | INTRAVENOUS | Status: DC | PRN
  Administered 2022-08-15 – 2022-08-23 (×23): 2 mg via INTRAVENOUS
  Filled 2022-08-15 (×23): qty 1

## 2022-08-15 MED ORDER — INSULIN ASPART 100 UNIT/ML IJ SOLN
0.0000 [IU] | Freq: Three times a day (TID) | INTRAMUSCULAR | Status: DC
  Administered 2022-08-15 – 2022-08-16 (×2): 7 [IU] via SUBCUTANEOUS
  Administered 2022-08-16: 11 [IU] via SUBCUTANEOUS
  Administered 2022-08-16: 7 [IU] via SUBCUTANEOUS
  Administered 2022-08-17: 3 [IU] via SUBCUTANEOUS
  Administered 2022-08-17: 7 [IU] via SUBCUTANEOUS
  Administered 2022-08-18: 4 [IU] via SUBCUTANEOUS
  Administered 2022-08-19: 7 [IU] via SUBCUTANEOUS
  Administered 2022-08-19: 4 [IU] via SUBCUTANEOUS
  Administered 2022-08-20 (×2): 3 [IU] via SUBCUTANEOUS

## 2022-08-15 MED ORDER — PERFLUTREN LIPID MICROSPHERE
1.0000 mL | INTRAVENOUS | Status: AC | PRN
  Administered 2022-08-15: 2 mL via INTRAVENOUS

## 2022-08-15 MED ORDER — INSULIN REGULAR HUMAN (CONC) 500 UNIT/ML ~~LOC~~ SOPN
75.0000 [IU] | PEN_INJECTOR | Freq: Every day | SUBCUTANEOUS | Status: DC
  Administered 2022-08-16: 75 [IU] via SUBCUTANEOUS
  Administered 2022-08-17: 40 [IU] via SUBCUTANEOUS
  Administered 2022-08-18 – 2022-08-23 (×6): 75 [IU] via SUBCUTANEOUS
  Filled 2022-08-15: qty 3

## 2022-08-15 MED ORDER — ONDANSETRON HCL 4 MG/2ML IJ SOLN: 4.0000 mg | Freq: Four times a day (QID) | INTRAMUSCULAR | Status: DC | PRN

## 2022-08-15 MED ORDER — IBUPROFEN 800 MG PO TABS
800.0000 mg | ORAL_TABLET | Freq: Once | ORAL | Status: AC
  Administered 2022-08-15: 800 mg via ORAL
  Filled 2022-08-15: qty 1

## 2022-08-15 MED ORDER — ALBUTEROL SULFATE HFA 108 (90 BASE) MCG/ACT IN AERS: 2.0000 | INHALATION_SPRAY | Freq: Four times a day (QID) | RESPIRATORY_TRACT | Status: DC | PRN

## 2022-08-15 MED ORDER — NITROGLYCERIN 0.4 MG SL SUBL
0.4000 mg | SUBLINGUAL_TABLET | SUBLINGUAL | Status: DC | PRN
  Administered 2022-08-15: 0.4 mg via SUBLINGUAL
  Filled 2022-08-15: qty 1

## 2022-08-15 MED ORDER — FUROSEMIDE 10 MG/ML IJ SOLN: 40.0000 mg | Freq: Two times a day (BID) | INTRAMUSCULAR | Status: DC

## 2022-08-15 MED ORDER — INSULIN REGULAR HUMAN (CONC) 500 UNIT/ML ~~LOC~~ SOPN
80.0000 [IU] | PEN_INJECTOR | Freq: Every day | SUBCUTANEOUS | Status: DC
Start: 2022-08-16 — End: 2022-08-24
  Administered 2022-08-18 – 2022-08-23 (×6): 80 [IU] via SUBCUTANEOUS
  Filled 2022-08-15 (×3): qty 3

## 2022-08-15 MED ORDER — BUDESONIDE 0.25 MG/2ML IN SUSP
0.2500 mg | Freq: Two times a day (BID) | RESPIRATORY_TRACT | Status: DC
  Administered 2022-08-15 – 2022-08-23 (×16): 0.25 mg via RESPIRATORY_TRACT
  Filled 2022-08-15 (×17): qty 2

## 2022-08-15 MED ORDER — INSULIN REGULAR HUMAN (CONC) 500 UNIT/ML ~~LOC~~ SOPN: 75.0000 [IU] | PEN_INJECTOR | Freq: Three times a day (TID) | SUBCUTANEOUS | Status: DC

## 2022-08-15 MED ORDER — GABAPENTIN 300 MG PO CAPS
600.0000 mg | ORAL_CAPSULE | Freq: Three times a day (TID) | ORAL | Status: DC
  Administered 2022-08-15 – 2022-08-23 (×26): 600 mg via ORAL
  Filled 2022-08-15 (×26): qty 2

## 2022-08-15 MED ORDER — FLUTICASONE PROPIONATE HFA 110 MCG/ACT IN AERO: 2.0000 | INHALATION_SPRAY | Freq: Two times a day (BID) | RESPIRATORY_TRACT | Status: DC

## 2022-08-15 MED ORDER — FUROSEMIDE 10 MG/ML IJ SOLN
40.0000 mg | Freq: Once | INTRAMUSCULAR | Status: AC
  Administered 2022-08-15: 40 mg via INTRAVENOUS

## 2022-08-15 MED ORDER — GABAPENTIN 600 MG PO TABS
600.0000 mg | ORAL_TABLET | Freq: Once | ORAL | Status: AC
  Administered 2022-08-15: 600 mg via ORAL
  Filled 2022-08-15: qty 1

## 2022-08-15 MED ORDER — ISOSORBIDE MONONITRATE ER 60 MG PO TB24
60.0000 mg | ORAL_TABLET | Freq: Every day | ORAL | Status: DC
  Administered 2022-08-15 – 2022-08-23 (×9): 60 mg via ORAL
  Filled 2022-08-15 (×4): qty 1
  Filled 2022-08-15: qty 2
  Filled 2022-08-15 (×4): qty 1

## 2022-08-15 MED ORDER — INSULIN REGULAR HUMAN (CONC) 500 UNIT/ML ~~LOC~~ SOPN
75.0000 [IU] | PEN_INJECTOR | Freq: Every day | SUBCUTANEOUS | Status: DC
  Administered 2022-08-15 – 2022-08-16 (×2): 75 [IU] via SUBCUTANEOUS
  Filled 2022-08-15: qty 3

## 2022-08-15 MED ORDER — ENOXAPARIN SODIUM 40 MG/0.4ML IJ SOSY
40.0000 mg | PREFILLED_SYRINGE | INTRAMUSCULAR | Status: DC
  Administered 2022-08-15: 40 mg via SUBCUTANEOUS
  Filled 2022-08-15: qty 0.4

## 2022-08-15 MED ORDER — METHOCARBAMOL 500 MG PO TABS
750.0000 mg | ORAL_TABLET | Freq: Three times a day (TID) | ORAL | Status: DC | PRN
  Administered 2022-08-15 – 2022-08-23 (×10): 750 mg via ORAL
  Filled 2022-08-15 (×10): qty 2

## 2022-08-15 MED ORDER — DULOXETINE HCL 30 MG PO CPEP
30.0000 mg | ORAL_CAPSULE | Freq: Every day | ORAL | Status: DC
  Administered 2022-08-15 – 2022-08-23 (×9): 30 mg via ORAL
  Filled 2022-08-15 (×9): qty 1

## 2022-08-15 MED ORDER — SODIUM CHLORIDE 0.9% FLUSH
3.0000 mL | Freq: Two times a day (BID) | INTRAVENOUS | Status: DC
  Administered 2022-08-15 – 2022-08-23 (×17): 3 mL via INTRAVENOUS

## 2022-08-15 MED ORDER — ALBUTEROL SULFATE (2.5 MG/3ML) 0.083% IN NEBU
2.5000 mg | INHALATION_SOLUTION | Freq: Four times a day (QID) | RESPIRATORY_TRACT | Status: DC | PRN
  Administered 2022-08-15 (×2): 2.5 mg via RESPIRATORY_TRACT
  Filled 2022-08-15 (×2): qty 3

## 2022-08-15 MED ORDER — ONDANSETRON HCL 4 MG PO TABS: 4.0000 mg | ORAL_TABLET | Freq: Four times a day (QID) | ORAL | Status: DC | PRN

## 2022-08-15 MED ORDER — TRAZODONE HCL 50 MG PO TABS
25.0000 mg | ORAL_TABLET | Freq: Every evening | ORAL | Status: DC | PRN
  Administered 2022-08-15 – 2022-08-22 (×8): 25 mg via ORAL
  Filled 2022-08-15 (×8): qty 1

## 2022-08-15 MED ORDER — POLYETHYLENE GLYCOL 3350 17 G PO PACK
17.0000 g | PACK | Freq: Every day | ORAL | Status: DC | PRN
Start: 2022-08-15 — End: 2022-08-24

## 2022-08-15 MED ORDER — ASPIRIN 81 MG PO CHEW
81.0000 mg | CHEWABLE_TABLET | Freq: Every day | ORAL | Status: DC
  Administered 2022-08-16 – 2022-08-23 (×8): 81 mg via ORAL
  Filled 2022-08-15 (×8): qty 1

## 2022-08-15 MED ORDER — MELATONIN 5 MG PO TABS
10.0000 mg | ORAL_TABLET | Freq: Every day | ORAL | Status: DC
  Administered 2022-08-15 – 2022-08-22 (×8): 10 mg via ORAL
  Filled 2022-08-15 (×8): qty 2

## 2022-08-15 MED ORDER — OXYCODONE HCL 5 MG PO TABS
5.0000 mg | ORAL_TABLET | Freq: Once | ORAL | Status: AC | PRN
  Administered 2022-08-15: 5 mg via ORAL
  Filled 2022-08-15: qty 1

## 2022-08-15 MED ORDER — ATORVASTATIN CALCIUM 10 MG PO TABS
20.0000 mg | ORAL_TABLET | Freq: Every day | ORAL | Status: DC
  Administered 2022-08-15 – 2022-08-23 (×9): 20 mg via ORAL
  Filled 2022-08-15 (×9): qty 2

## 2022-08-15 MED ORDER — BACLOFEN 10 MG PO TABS
5.0000 mg | ORAL_TABLET | Freq: Three times a day (TID) | ORAL | Status: DC | PRN
  Administered 2022-08-18 – 2022-08-23 (×5): 10 mg via ORAL
  Filled 2022-08-15 (×6): qty 1

## 2022-08-15 MED ORDER — HYDRALAZINE HCL 25 MG PO TABS
100.0000 mg | ORAL_TABLET | Freq: Three times a day (TID) | ORAL | Status: DC
  Administered 2022-08-15 – 2022-08-23 (×26): 100 mg via ORAL
  Filled 2022-08-15 (×26): qty 4

## 2022-08-15 NOTE — ED Notes (Signed)
Pure wick placed.

## 2022-08-15 NOTE — ED Notes (Signed)
Lactic of 2.0 reported to Dr Clayborne Dana

## 2022-08-15 NOTE — Progress Notes (Signed)
  Echocardiogram 2D Echocardiogram has been performed.  Lindsay [name] 08/15/2022, 2:37 PM

## 2022-08-15 NOTE — ED Notes (Signed)
Pt transported to ECHO. 

## 2022-08-15 NOTE — ED Notes (Addendum)
Provider notified that patient's oral tempeture has not improved and that the patient appears to be uncomfortable and working to breathe

## 2022-08-15 NOTE — ED Notes (Signed)
Patient c/o pain and neuropathy- requesting pain meds and her home gabapentin. MD Opyd contacted.

## 2022-08-15 NOTE — ED Notes (Signed)
Provider notified of new tempeture recorded and the new EKG repeat

## 2022-08-15 NOTE — ED Notes (Signed)
Ambulates to the restroom at this time 

## 2022-08-15 NOTE — ED Notes (Signed)
Waiting on pharmacy to verify home medications. Pt assisted onto hospital stretcher. Pt stood well without assistance. Pt verbalizes being much more comfortable. Pt call bell in reach. Will continue to monitor pt.

## 2022-08-15 NOTE — ED Notes (Signed)
Patient repositioned in bed. Bed linens changed. Perineal care performed. Purewick replaced. Provided with pillows and warm blankets. Patient resting comfortably at this time with no other complaints.

## 2022-08-15 NOTE — H&P (Signed)
History and Physical    Patient: Lindsay [name] DOB: 1981-01-17 DOA: 08/14/2022 DOS: the patient was seen and examined on 08/15/2022 PCP: [name], NP  Patient coming from: Home - lives with 40yo daughter; NOK: Cherylann Ratel, [phone]   Chief Complaint: SOB/CP  HPI: Lindsay [name] is a 41 y.o. female with medical history significant of CAD s/p stent; DM; HTN; HLD; and super morbid obesity presenting with CP/SOB.  She reports that she noticed more SOB and fluid building up.  It was harder for her to breathe and be comfortable.  Her inhaler wasn't working.  She got chills last night and they were getting worse.  She noticed her body was retaining some fluid around Monday, SOB on Wednesday.  She has had this issue before.  She does take torsemide at home - 40 mg BID.  No medication changes recently, has been compliant.    ER Course:  Carryover, per Dr. Myna Hidalgo:  41 year old female with CAD, IDDM, and BMI 50 who presents with acute onset shortness of breath and central chest discomfort.  Initial blood pressure was 220/93 and chest x-ray notable for cardiomegaly and pulmonary edema.  She was found to be febrile with normal WBC, negative COVID PCR, and unclear source.  Blood pressure and respiratory status improved in the ED with 40 mg IV Lasix and 1 sublingual nitroglycerin.  She was also treated with Rocephin and azithromycin.     Review of Systems: As mentioned in the history of present illness. All other systems reviewed and are negative. Past Medical History:  Diagnosis Date   ARF (acute renal failure) ([place]) 04/2015   Asthma    Cellulitis of right upper extremity    Coronary artery disease    Diabetes mellitus    insulin dependent   Hyperlipidemia LDL goal <70    Hypertension    Obesity    S/P angioplasty with stent 08/2016   DES to mLAD and PTCA only to 2nd diag ostium.    Past Surgical History:  Procedure Laterality Date   CARDIAC  CATHETERIZATION N/A 09/07/2016   Procedure: Left Heart Cath and Coronary Angiography;  Surgeon: [name], MD;  Location: [facility];  Service: Cardiovascular;  Laterality: N/A;   CARDIAC CATHETERIZATION N/A 09/07/2016   Procedure: Coronary Stent Intervention;  Surgeon: [name], MD;  Location: [facility];  Service: Cardiovascular;  Laterality: N/A;   CARDIAC CATHETERIZATION N/A 09/07/2016   Procedure: Coronary Balloon Angioplasty;  Surgeon: [name], MD;  Location: [facility];  Service: Cardiovascular;  Laterality: N/A;   CESAREAN SECTION     IRRIGATION AND DEBRIDEMENT SHOULDER Right 04/30/2015   Procedure: IRRIGATION AND DEBRIDEMENT SHOULDER;  Surgeon: Renette Butters, MD;  Location: [facility];  Service: Orthopedics;  Laterality: Right;   IRRIGATION AND DEBRIDEMENT SHOULDER Right 05/01/2015   LEFT HEART CATH AND CORONARY ANGIOGRAPHY N/A 04/25/2020   Procedure: LEFT HEART CATH AND CORONARY ANGIOGRAPHY;  Surgeon: [name], MD;  Location: [facility];  Service: Cardiovascular;  Laterality: N/A;   LEG SURGERY     SHOULDER ARTHROSCOPY Right 04/30/2015   Procedure: ARTHROSCOPY SHOULDER;  Surgeon: Renette Butters, MD;  Location: [facility];  Service: Orthopedics;  Laterality: Right;   TONSILLECTOMY     Social History:  reports that she quit smoking about 15 years ago. Her smoking use included cigarettes. She has a 0.50 pack-year smoking history. She has never used smokeless tobacco. She  reports current alcohol use. She reports that she does not use drugs.  Allergies  Allergen Reactions   Acetaminophen Itching and Swelling    Itching of the mouth, swelling of tongue and stomach started hurting mild   Hydrazine Yellow [Tartrazine] Shortness Of Breath and Swelling    Swelling mostly noticed in legs and feet, retaining urination, shortness of breaht, and minor chest pain   Lisinopril Shortness Of Breath    Was on prinzide; had sob/chest pain on it.   Pineapple  Itching    Family History  Problem Relation Age of Onset   Diabetes Mother    Hypertension Mother    Heart disease Mother    Diabetes Father    Heart disease Father    Stroke Maternal Grandmother    Cancer Maternal Grandmother    Breast cancer Cousin        37's   Breast cancer Cousin        50's    Prior to Admission medications   Medication Sig Start Date End Date Taking? Authorizing Provider  Accu-Chek FastClix Lancets MISC USE 1 TO CHECK GLUCOSE 4 TIMES DAILY 01/26/20   Charlott Rakes, MD  albuterol (VENTOLIN HFA) 108 (90 Base) MCG/ACT inhaler Inhale 2 puffs into the lungs every 6 (six) hours as needed for wheezing or shortness of breath. 08/28/21   [name], NP  aspirin 81 MG tablet Take 1 tablet (81 mg total) by mouth daily. 01/13/18   Clent Demark, PA-C  atorvastatin (LIPITOR) 20 MG tablet Take 1 tablet (20 mg total) by mouth daily. 08/28/21   [name], NP  baclofen (LIORESAL) 10 MG tablet Take 0.5-1 tablets (5-10 mg total) by mouth 3 (three) times daily as needed for muscle spasms. 08/04/21   Hilts, Legrand Como, MD  BD PEN NEEDLE NANO 2ND GEN 32G X 4 MM MISC Use to inject into the skin in the morning, at noon, and at bedtime as directed. 02/25/22   [name], NP  BD VEO INSULIN SYRINGE U/F 31G X 15/64" 1 ML MISC USE  SYRINGE ONCE DAILY 06/07/18   Elayne Snare, MD  Blood Glucose Monitoring Suppl (ACCU-CHEK AVIVA PLUS) w/Device KIT 1 each by Does not apply route as directed. 04/15/18   Elayne Snare, MD  Continuous Blood Gluc Transmit (DEXCOM G6 TRANSMITTER) MISC Inject into the skin. 06/09/21   [provider]  diclofenac Sodium (VOLTAREN) 1 % GEL Apply 4 g topically 4 (four) times daily. 12/03/21   [name], NP  diphenhydrAMINE (BENADRYL) 25 MG tablet Take 50 mg by mouth in the morning and at bedtime. Allergies    [provider]  DULoxetine (CYMBALTA) 30 MG capsule Take 1 capsule (30 mg total) by mouth daily. 02/27/22   [name], NP  fluticasone (FLOVENT HFA) 110 MCG/ACT inhaler Inhale 2 puffs into the lungs 2 (two) times daily. 08/28/21   [name], NP  gabapentin (NEURONTIN) 300 MG capsule Take 2 capsules (600 mg total) by mouth 3 (three) times daily. 05/21/22   [name], NP  glucose blood (ACCU-CHEK AVIVA PLUS) test strip Use as instructed to test blood sugar 4 times daily. 01/11/19   [name], NP  HUMULIN R U-500 KWIKPEN 500 UNIT/ML kwikpen Inject 75-80 Units into the skin 3 (three) times daily with meals. AM 80 units  Noon 75 units  Evening 75 units If BG above 200 add 5 units to prescribed dose 09/30/20   [provider]  hydrALAZINE (APRESOLINE) 100 MG tablet Take 1 tablet (100 mg total) by mouth 3 (three) times daily. 02/13/22   [name], NP  hydrochlorothiazide (HYDRODIURIL) 25 MG tablet Take 25 mg by mouth 3 (three) times daily. 04/27/17   [provider]  ibuprofen (ADVIL) 600 MG tablet Take 1 tablet (600 mg total) by mouth every 8 (eight) hours as needed. 07/24/22   [name], NP  Insulin Pen Needle (B-D UF III MINI PEN NEEDLES) 31G X 5 MM MISC Use as instructed. Monitor blood glucose levels three times per day 04/13/19   [name], NP  isosorbide mononitrate (IMDUR) 30 MG 24 hr tablet TAKE 1 TABLET BY MOUTH DAILY 10/21/21   [name], MD  isosorbide mononitrate (IMDUR) 60 MG 24 hr tablet Take 1 tablet (60 mg total) by mouth daily. 08/01/21   [name], [name], MD  ketorolac (ACULAR) 0.5 % ophthalmic solution  08/27/21   [provider]  liraglutide (VICTOZA) 18 MG/3ML SOPN Inject 1.8 mg into the skin at bedtime.  07/26/19   [provider]  losartan (COZAAR) 100 MG tablet TAKE 1 TABLET BY MOUTH ONCE DAILY 11/19/21   [name], MD  Melatonin 10 MG TABS Take 3 tablets by mouth at bedtime.    [provider]  methocarbamol (ROBAXIN) 500 MG tablet Take 1.5 tablets (750 mg total) by mouth every 8 (eight)  hours as needed for muscle spasms. 12/26/21   Persons, Bevely Palmer, PA  metoprolol tartrate (LOPRESSOR) 50 MG tablet Take 1 tablet (50 mg total) by mouth 2 (two) times daily. 07/31/21   [name], [name], MD  moxifloxacin (VIGAMOX) 0.5 % ophthalmic solution  08/27/21   [provider]  Multiple Vitamins-Minerals (MULTIVITAMIN PO) Take 1 tablet by mouth daily.    [provider]  nitroGLYCERIN (NITROSTAT) 0.4 MG SL tablet Place 1 tablet (0.4 mg total) under the tongue every 5 (five) minutes as needed for chest pain. Please keep upcoming appt in October 2022. Final Attempt 06/19/21   [name], MD  ofloxacin (OCUFLOX) 0.3 % ophthalmic solution Place 1 drop into the left eye 4 (four) times daily. 06/30/21   [provider]  potassium chloride SA (KLOR-CON) 20 MEQ tablet Take 1 tablet (20 mEq total) by mouth daily. 08/15/21   [name], MD  prednisoLONE acetate (PRED FORTE) 1 % ophthalmic suspension Place 1 drop into the left eye 4 (four) times daily. 06/30/21   [provider]  sodium chloride (OCEAN) 0.65 % nasal spray Place into the nose as needed. 02/19/17   [provider]  torsemide (DEMADEX) 20 MG tablet Take by mouth 2 (two) times daily. Dose Change: Take 2 tabs (40 mg total) twice a day. 08/15/21   [provider]    Physical Exam: Vitals:   08/15/22 0905 08/15/22 1217 08/15/22 1312 08/15/22 1330  BP: 138/78 (!) 172/71 (!) 156/73 (!) 154/63  Pulse: 84  89 86  Resp: _0 Temp: 99.5 F (37.5 C)  98.8 F (37.1 C)   TempSrc: Oral     SpO2: 96%  94% 96%  Weight: (!) 190.5 kg     Height: 5' 7" (1.702 m)      General:  Appears calm and comfortable and is in NAD, on RA Eyes:  PERRL, EOMI, normal lids, iris ENT:  grossly normal hearing, lips & tongue, mmm; appropriate dentition Neck:  no LAD, masses or thyromegaly Cardiovascular:  RRR, no m/r/g.  1+ LE edema (although exam is limited by body habitus).  Respiratory:   CTA bilaterally  with no wheezes/rales/rhonchi.  Normal respiratory effort. Abdomen:  soft, NT, ND Skin:  no rash or induration seen on limited exam Musculoskeletal:  grossly normal tone BUE/BLE, good ROM, no bony abnormality Psychiatric:  grossly normal mood and affect, speech fluent and appropriate, AOx3 Neurologic:  CN 2-12 grossly intact, moves all extremities in coordinated fashion   Radiological Exams on Admission: Independently reviewed - see discussion in A/P where applicable  DG Chest 2 View  Result Date: 08/14/2022 CLINICAL DATA:  Chest pain, shortness of breath. EXAM: CHEST - 2 VIEW COMPARISON:  Chest radiograph dated July 17, 2021 FINDINGS: The heart is enlarged. Prominence of the central pulmonary vessels. Bilateral hazy lung opacities concerning for pulmonary edema. No appreciable pleural effusion. IMPRESSION: Cardiomegaly with pulmonary edema suggesting congestive heart failure, similar to prior examination. Electronically Signed   By: Keane Police D.O.   On: 08/14/2022 23:25    EKG: Independently reviewed.   2247 - Sinus tachycardia with rate 105; nonspecific ST changes with no evidence of acute ischemia   Labs on Admission: I have personally reviewed the available labs and imaging studies at the time of the admission.  Pertinent labs:    BUN 22/Creatinine 1.16/GFR >60 BNP 77.3 HS troponin 8, 8 Lactate 2, 1.8 WBC 7.9 Hgb 10.5 HCG <5 UA 30 protein   Assessment and Plan: Principal Problem:   Shortness of breath Active Problems:   Essential hypertension   AKI (acute kidney injury) ([facility])   Morbid obesity (HCC)   Coronary artery disease involving native coronary artery of native heart with angina pectoris (HCC)   Hyperlipidemia   Uncontrolled type 2 diabetes mellitus with hyperglycemia, with long-term current use of insulin (HCC)   Back pain   Mood disorder (HCC)    SOB/CP -Patient without prior h/o CHF presenting with worsening SOB and edema -CXR consistent with mild  pulmonary edema -However BNP is normal -Acute decompensated CHF is possible, will observe overnight -She also had fever on presentation with slightly elevated lactate; suspect viral etiology as there is no current evidence of infection at this time (given Rocephin/Azithromycin x 1 in the ER) -Will admit with telemetry -Will request echocardiogram -Was given Lasix 40 mg x 1 in ER and will repeat with 40 mg x 1 additional dose -Continue Albuterol, Flovent -Certainly, she is at risk for OHS/OSA  AKI -Renal function is slightly worse than baseline, possibly related to diminished renal perfusion in the setting of mild volume overload -Will hold losartan, resume when able -Advil/NSAIDs are likely not her best long-term option for pain control  HTN -Continue home hydralazine, metoprolol -Hold losartan -Will also add prn hydralazine  HLD -Continue Lipitor  DM -A1c is 10, indicating very poor control -Hold Victoza -She is on a massive dose of insulin - U500 80 units qAM, 75 units qnoon, 75 units qhs -She also takes an additional 5 units of U500 if glucose is >200; will hold this for now -Will cover with resistant-scale SSI including qhs coverage for now  CAD -s/p stent -Continue ASA, Imdur -Negative troponin x 2, low suspicion for ACS  Back pain -Chronic, but exacerbated  by stretcher -Bariatric bed ordered -Continue baclofen, neurontin, Robaxin  Mood d/o -Continue Cymbalta, melatonin  Obesity -Body mass index is 65.78 kg/m..  -Weight loss should be encouraged -Outpatient PCP/bariatric medicine/bariatric surgery f/u encouraged      Advance Care Planning:   Code Status:  Full Code   Consults: CHF navigator; TOC team; nutrition; PT/OT  DVT Prophylaxis: Lovenox  Family Communication: None present; she is capable of communicating with family at this time  Severity of Illness: Admit - It is my clinical opinion that admission to INPATIENT is reasonable and necessary  because of the expectation that this patient will require hospital care that crosses at least 2 midnights to treat this condition based on the medical complexity of the problems presented.  Given the aforementioned information, the predictability of an adverse outcome is felt to be significant.    Author: Karmen Bongo, MD 08/15/2022 4:55 PM  For on call review [email or web address].

## 2022-08-15 NOTE — ED Provider Notes (Signed)
[facility] Provider Note   CSN: [id number] Arrival date & time: 08/14/22  2238     History  Chief Complaint  Patient presents with   Shortness of Breath    Lindsay [name] is a 41 y.o. female.  41 yo F here with dyspnea. H/o CHF on lasix. Noticed a few days of progressively worsening LE edema but also now ith CP and dyspnea that has worsened over last 24 hours. Chills. No fevers. No rashes. No other symptoms.    Shortness of Breath      Home Medications Prior to Admission medications   Medication Sig Start Date End Date Taking? Authorizing Provider  Accu-Chek FastClix Lancets MISC USE 1 TO CHECK GLUCOSE 4 TIMES DAILY 01/26/20   Charlott Rakes, MD  albuterol (VENTOLIN HFA) 108 (90 Base) MCG/ACT inhaler Inhale 2 puffs into the lungs every 6 (six) hours as needed for wheezing or shortness of breath. 08/28/21   [name], NP  aspirin 81 MG tablet Take 1 tablet (81 mg total) by mouth daily. 01/13/18   Clent Demark, PA-C  atorvastatin (LIPITOR) 20 MG tablet Take 1 tablet (20 mg total) by mouth daily. 08/28/21   [name], NP  baclofen (LIORESAL) 10 MG tablet Take 0.5-1 tablets (5-10 mg total) by mouth 3 (three) times daily as needed for muscle spasms. 08/04/21   Hilts, Legrand Como, MD  BD PEN NEEDLE NANO 2ND GEN 32G X 4 MM MISC Use to inject into the skin in the morning, at noon, and at bedtime as directed. 02/25/22   [name], NP  BD VEO INSULIN SYRINGE U/F 31G X 15/64" 1 ML MISC USE  SYRINGE ONCE DAILY 06/07/18   Elayne Snare, MD  Blood Glucose Monitoring Suppl (ACCU-CHEK AVIVA PLUS) w/Device KIT 1 each by Does not apply route as directed. 04/15/18   Elayne Snare, MD  Continuous Blood Gluc Transmit (DEXCOM G6 TRANSMITTER) MISC Inject into the skin. 06/09/21   [provider]  diclofenac Sodium (VOLTAREN) 1 % GEL Apply 4 g topically 4 (four) times daily. 12/03/21   [name], NP  diphenhydrAMINE  (BENADRYL) 25 MG tablet Take 50 mg by mouth in the morning and at bedtime. Allergies    [provider]  DULoxetine (CYMBALTA) 30 MG capsule Take 1 capsule (30 mg total) by mouth daily. 02/27/22   [name], NP  fluticasone (FLOVENT HFA) 110 MCG/ACT inhaler Inhale 2 puffs into the lungs 2 (two) times daily. 08/28/21   [name], NP  gabapentin (NEURONTIN) 300 MG capsule Take 2 capsules (600 mg total) by mouth 3 (three) times daily. 05/21/22   [name], NP  glucose blood (ACCU-CHEK AVIVA PLUS) test strip Use as instructed to test blood sugar 4 times daily. 01/11/19   [name], NP  HUMULIN R U-500 KWIKPEN 500 UNIT/ML kwikpen Inject 75-80 Units into the skin 3 (three) times daily with meals. AM 80 units  Noon 75 units  Evening 75 units If BG above 200 add 5 units to prescribed dose 09/30/20   [provider]  hydrALAZINE (APRESOLINE) 100 MG tablet Take 1 tablet (100 mg total) by mouth 3 (three) times daily. 02/13/22   [name], NP  hydrochlorothiazide (HYDRODIURIL) 25 MG tablet Take 25 mg by mouth 3 (three) times daily. 04/27/17   [provider]  ibuprofen (ADVIL) 600 MG tablet Take 1 tablet (600 mg total) by mouth every 8 (eight) hours as needed.  07/24/22   [name], NP  Insulin Pen Needle (B-D UF III MINI PEN NEEDLES) 31G X 5 MM MISC Use as instructed. Monitor blood glucose levels three times per day 04/13/19   [name], NP  isosorbide mononitrate (IMDUR) 30 MG 24 hr tablet TAKE 1 TABLET BY MOUTH DAILY 10/21/21   [name], MD  isosorbide mononitrate (IMDUR) 60 MG 24 hr tablet Take 1 tablet (60 mg total) by mouth daily. 08/01/21   [name], [name], MD  ketorolac (ACULAR) 0.5 % ophthalmic solution  08/27/21   [provider]  liraglutide (VICTOZA) 18 MG/3ML SOPN Inject 1.8 mg into the skin at bedtime.  07/26/19   [provider]  losartan (COZAAR) 100 MG tablet TAKE 1 TABLET BY MOUTH ONCE DAILY  11/19/21   [name], MD  Melatonin 10 MG TABS Take 3 tablets by mouth at bedtime.    [provider]  methocarbamol (ROBAXIN) 500 MG tablet Take 1.5 tablets (750 mg total) by mouth every 8 (eight) hours as needed for muscle spasms. 12/26/21   Persons, Bevely Palmer, PA  metoprolol tartrate (LOPRESSOR) 50 MG tablet Take 1 tablet (50 mg total) by mouth 2 (two) times daily. 07/31/21   [name], [name], MD  moxifloxacin (VIGAMOX) 0.5 % ophthalmic solution  08/27/21   [provider]  Multiple Vitamins-Minerals (MULTIVITAMIN PO) Take 1 tablet by mouth daily.    [provider]  nitroGLYCERIN (NITROSTAT) 0.4 MG SL tablet Place 1 tablet (0.4 mg total) under the tongue every 5 (five) minutes as needed for chest pain. Please keep upcoming appt in October 2022. Final Attempt 06/19/21   [name], MD  ofloxacin (OCUFLOX) 0.3 % ophthalmic solution Place 1 drop into the left eye 4 (four) times daily. 06/30/21   [provider]  potassium chloride SA (KLOR-CON) 20 MEQ tablet Take 1 tablet (20 mEq total) by mouth daily. 08/15/21   [name], MD  prednisoLONE acetate (PRED FORTE) 1 % ophthalmic suspension Place 1 drop into the left eye 4 (four) times daily. 06/30/21   [provider]  sodium chloride (OCEAN) 0.65 % nasal spray Place into the nose as needed. 02/19/17   [provider]  torsemide (DEMADEX) 20 MG tablet Take by mouth 2 (two) times daily. Dose Change: Take 2 tabs (40 mg total) twice a day. 08/15/21   [provider]      Allergies    Acetaminophen, Hydrazine yellow [tartrazine], and Lisinopril    Review of Systems   Review of Systems  Respiratory:  Positive for shortness of breath.     Physical Exam Updated Vital Signs BP (!) 160/66   Pulse (!) 101   Temp (!) 101.4 F (38.6 C) (Oral)   Resp (!) 24   SpO2 94%  Physical Exam Vitals and nursing note reviewed.  Constitutional:      Appearance: She is well-developed.  HENT:      Head: Normocephalic and atraumatic.  Cardiovascular:     Rate and Rhythm: Normal rate and regular rhythm.  Pulmonary:     Effort: Tachypnea present. No respiratory distress.     Breath sounds: No stridor. Decreased breath sounds and wheezing present.  Abdominal:     General: There is no distension.  Musculoskeletal:     Cervical back: Normal range of motion.  Skin:    General: Skin is warm and dry.  Neurological:     General: No focal deficit present.  Mental Status: She is alert.     ED Results / Procedures / Treatments   Labs (all labs ordered are listed, but only abnormal results are displayed) Labs Reviewed  BASIC METABOLIC PANEL - Abnormal; Notable for the following components:      Result Value   Glucose, Bld 101 (*)    BUN 22 (*)    Creatinine, Ser 1.16 (*)    All other components within normal limits  CBC - Abnormal; Notable for the following components:   Hemoglobin 10.5 (*)    HCT 33.8 (*)    All other components within normal limits  LACTIC ACID, PLASMA - Abnormal; Notable for the following components:   Lactic Acid, Venous 2.0 (*)    All other components within normal limits  SARS CORONAVIRUS 2 BY RT PCR  LACTIC ACID, PLASMA  BRAIN NATRIURETIC PEPTIDE  URINALYSIS, ROUTINE W REFLEX MICROSCOPIC  I-STAT BETA HCG BLOOD, ED ([facility], [facility], [facility] ONLY)  TROPONIN I (HIGH SENSITIVITY)  TROPONIN I (HIGH SENSITIVITY)    EKG EKG Interpretation  Date/Time:  Saturday August 15 2022 00:49:20 EDT Ventricular Rate:  98 PR Interval:  142 QRS Duration: 91 QT Interval:  365 QTC Calculation: 466 R Axis:   28 Text Interpretation: Sinus rhythm Low voltage, precordial leads Borderline T abnormalities, anterior leads Confirmed by Merrily Pew [phone]) on 08/15/2022 2:42:27 AM  Radiology DG Chest 2 View  Result Date: 08/14/2022 CLINICAL DATA:  Chest pain, shortness of breath. EXAM: CHEST - 2 VIEW COMPARISON:  Chest radiograph dated July 17, 2021 FINDINGS: The heart is  enlarged. Prominence of the central pulmonary vessels. Bilateral hazy lung opacities concerning for pulmonary edema. No appreciable pleural effusion. IMPRESSION: Cardiomegaly with pulmonary edema suggesting congestive heart failure, similar to prior examination. Electronically Signed   By: Keane Police D.O.   On: 08/14/2022 23:25    Procedures Procedures    Medications Ordered in ED Medications  azithromycin (ZITHROMAX) 500 mg in sodium chloride 0.9 % 250 mL IVPB (0 mg Intravenous Stopped 08/15/22 0314)  nitroGLYCERIN (NITROSTAT) SL tablet 0.4 mg (0.4 mg Sublingual Given 08/15/22 0247)  ibuprofen (ADVIL) tablet 400 mg (400 mg Oral Given 08/14/22 2257)  ibuprofen (ADVIL) tablet 800 mg (800 mg Oral Given 08/15/22 0122)  cefTRIAXone (ROCEPHIN) 2 g in sodium chloride 0.9 % 100 mL IVPB (0 g Intravenous Stopped 08/15/22 0150)  furosemide (LASIX) injection 40 mg (40 mg Intravenous Given 08/15/22 0226)    ED Course/ Medical Decision Making/ A&P                           Medical Decision Making Amount and/or Complexity of Data Reviewed Labs: ordered. Radiology: ordered.  Risk Prescription drug management. Decision regarding hospitalization.   Sepsis with atypical pneumonia vs chf but with persistent fever, needs antibiotics for CAP.   Patient with persistent chest pain, repeat ecg unchanged. Will give NTG to help with BP/chest pain.   Improved after NTG. Breathing improved. Diuresed quite a bit with lasix. Still febrile and hypertensive. Will admit for further diuresis/abx.   Final Clinical Impression(s) / ED Diagnoses Final diagnoses:  Dyspnea, unspecified type    Rx / DC Orders ED Discharge Orders     None         Thadeus Gandolfi, [name] Cornea, MD 08/15/22 [phone]

## 2022-08-15 NOTE — ED Notes (Signed)
Patient complaint of sever chest pain reported to Dr Clayborne Dana at this time via secure chat

## 2022-08-16 DIAGNOSIS — Z794 Long term (current) use of insulin: Secondary | ICD-10-CM | POA: Diagnosis not present

## 2022-08-16 DIAGNOSIS — I5033 Acute on chronic diastolic (congestive) heart failure: Secondary | ICD-10-CM

## 2022-08-16 DIAGNOSIS — R0602 Shortness of breath: Secondary | ICD-10-CM | POA: Diagnosis not present

## 2022-08-16 DIAGNOSIS — I10 Essential (primary) hypertension: Secondary | ICD-10-CM | POA: Diagnosis not present

## 2022-08-16 DIAGNOSIS — E1165 Type 2 diabetes mellitus with hyperglycemia: Secondary | ICD-10-CM | POA: Diagnosis not present

## 2022-08-16 DIAGNOSIS — N179 Acute kidney failure, unspecified: Secondary | ICD-10-CM

## 2022-08-16 DIAGNOSIS — I25119 Atherosclerotic heart disease of native coronary artery with unspecified angina pectoris: Secondary | ICD-10-CM

## 2022-08-16 LAB — CBC
HCT: 32.9 % — ABNORMAL LOW (ref 36.0–46.0)
Hemoglobin: 10.4 g/dL — ABNORMAL LOW (ref 12.0–15.0)
MCH: 27.1 pg (ref 26.0–34.0)
MCHC: 31.6 g/dL (ref 30.0–36.0)
MCV: 85.7 fL (ref 80.0–100.0)
Platelets: 278 10*3/uL (ref 150–400)
RBC: 3.84 MIL/uL — ABNORMAL LOW (ref 3.87–5.11)
RDW: 14.8 % (ref 11.5–15.5)
WBC: 8.6 10*3/uL (ref 4.0–10.5)
nRBC: 0 % (ref 0.0–0.2)

## 2022-08-16 LAB — GLUCOSE, CAPILLARY
Glucose-Capillary: 236 mg/dL — ABNORMAL HIGH (ref 70–99)
Glucose-Capillary: 257 mg/dL — ABNORMAL HIGH (ref 70–99)
Glucose-Capillary: 202 mg/dL — ABNORMAL HIGH (ref 70–99)
Glucose-Capillary: 185 mg/dL — ABNORMAL HIGH (ref 70–99)

## 2022-08-16 LAB — BASIC METABOLIC PANEL
Anion gap: 12 (ref 5–15)
BUN: 18 mg/dL (ref 6–20)
CO2: 23 mmol/L (ref 22–32)
Calcium: 9.1 mg/dL (ref 8.9–10.3)
Chloride: 103 mmol/L (ref 98–111)
Creatinine, Ser: 1.38 mg/dL — ABNORMAL HIGH (ref 0.44–1.00)
GFR, Estimated: 50 mL/min — ABNORMAL LOW (ref >60)
Glucose, Bld: 279 mg/dL — ABNORMAL HIGH (ref 70–99)
Potassium: 4.6 mmol/L (ref 3.5–5.1)
Sodium: 138 mmol/L (ref 135–145)

## 2022-08-16 MED ORDER — FUROSEMIDE 10 MG/ML IJ SOLN
80.0000 mg | Freq: Two times a day (BID) | INTRAMUSCULAR | Status: DC
Start: 2022-08-16 — End: 2022-08-19
  Administered 2022-08-16 – 2022-08-19 (×7): 80 mg via INTRAVENOUS
  Filled 2022-08-16 (×7): qty 8

## 2022-08-16 MED ORDER — ENOXAPARIN SODIUM 100 MG/ML IJ SOSY
100.0000 mg | PREFILLED_SYRINGE | INTRAMUSCULAR | Status: DC
  Administered 2022-08-16 – 2022-08-22 (×7): 100 mg via SUBCUTANEOUS
  Filled 2022-08-16 (×7): qty 1

## 2022-08-16 NOTE — Progress Notes (Signed)
Pt states she will place herself on/off as needed.  RT will cont to monitor.

## 2022-08-16 NOTE — Consult Note (Signed)
Cardiology Consultation   Patient ID: [name] MRN: [id number]; DOB: [birth year]  Admit date: 08/14/2022 Date of Consult: 08/16/2022  PCP:  Lindsay Perna, NP   [facility] Providers Cardiologist:  Sinclair Grooms, [name]        Patient Profile:   Lindsay [name] is a 41 y.o. female with a hx of DM, HTN, HLD, NSTEMI 08/2016 s/p DES mLAD & POBA D2 (patent at cath 2021), HFp/rEF, super morbid obesity, who is being seen 08/16/2022 for the evaluation of CHF at the request of Dr Darrick Meigs.  History of Present Illness:   [name] was last seen by Dr [name] 10/2021, wt 420, volume ok, no anginal sx, on BiPAP at night.   Pt admitted 08/26 with dyspnea, acute on chronic CHF. Wt in March was 420, was 442 lbs on admit.   She weighs herself at times, but not every day.  She feels her dry weight is 415 pounds.  She noticed that she was gaining weight and cut back on the amount of fluid she was drinking.  She was drinking 4-24 ounce cups daily, and cut that back to 2.  She also tried to cut back on salt although she feels that she eats a generally low-sodium diet.  She knew she was gaining weight and getting more short of breath.  She feels that the weight gain was all fluid because she says she has been watching what she eats and her portions, to make sure she does not eat too much.  She started feeling like she felt the last time she was admitted for volume overload.  In the 48 hours prior to admission, she was so short of breath she could not talk on the phone.  She also started hearing herself wheeze, but the wheezing did not resolve when she used her inhaler.  She is compliant with BiPAP nightly, so did not really have any PND, but clearly had orthopnea.  On the night before admission, she was having chills and shaking and was very cold to touch.  That is what made her decide to come in.  She has had 2 injections of Lasix 40 mg since admission, but none today.  After the  Lasix, her shortness of breath improved a little, but she definitely is not back to baseline.   Past Medical History:  Diagnosis Date   ARF (acute renal failure) ([place]) 04/2015   Asthma    Cellulitis of right upper extremity    Coronary artery disease    Diabetes mellitus    insulin dependent   Hyperlipidemia LDL goal <70    Hypertension    Obesity    S/P angioplasty with stent 08/2016   DES to mLAD and PTCA only to 2nd diag ostium.     Past Surgical History:  Procedure Laterality Date   CARDIAC CATHETERIZATION N/A 09/07/2016   Procedure: Left Heart Cath and Coronary Angiography;  Surgeon: [name], [name];  Location: [facility];  Service: Cardiovascular;  Laterality: N/A;   CARDIAC CATHETERIZATION N/A 09/07/2016   Procedure: Coronary Stent Intervention;  Surgeon: [name], [name];  Location: [facility];  Service: Cardiovascular;  Laterality: N/A;   CARDIAC CATHETERIZATION N/A 09/07/2016   Procedure: Coronary Balloon Angioplasty;  Surgeon: [name], [name];  Location: [facility];  Service: Cardiovascular;  Laterality: N/A;   CESAREAN SECTION     IRRIGATION AND DEBRIDEMENT SHOULDER Right 04/30/2015   Procedure: IRRIGATION  AND DEBRIDEMENT SHOULDER;  Surgeon: Renette Butters, [name];  Location: [facility];  Service: Orthopedics;  Laterality: Right;   IRRIGATION AND DEBRIDEMENT SHOULDER Right 05/01/2015   LEFT HEART CATH AND CORONARY ANGIOGRAPHY N/A 04/25/2020   Procedure: LEFT HEART CATH AND CORONARY ANGIOGRAPHY;  Surgeon: [name], [name];  Location: [facility];  Service: Cardiovascular;  Laterality: N/A;   LEG SURGERY     SHOULDER ARTHROSCOPY Right 04/30/2015   Procedure: ARTHROSCOPY SHOULDER;  Surgeon: Renette Butters, [name];  Location: [facility];  Service: Orthopedics;  Laterality: Right;   TONSILLECTOMY       Home Medications:  Prior to Admission medications   Medication Sig Start Date End Date Taking? Authorizing Provider  albuterol (VENTOLIN HFA) 108 (90  Base) MCG/ACT inhaler Inhale 2 puffs into the lungs every 6 (six) hours as needed for wheezing or shortness of breath. 08/28/21  Yes Lindsay Perna, NP  aspirin 81 MG tablet Take 1 tablet (81 mg total) by mouth daily. 01/13/18  Yes Lindsay Demark, PA-C  atorvastatin (LIPITOR) 20 MG tablet Take 1 tablet (20 mg total) by mouth daily. 08/28/21  Yes Lindsay Perna, NP  baclofen (LIORESAL) 10 MG tablet Take 0.5-1 tablets (5-10 mg total) by mouth 3 (three) times daily as needed for muscle spasms. 08/04/21  Yes Hilts, Legrand Como, [name]  diclofenac Sodium (VOLTAREN) 1 % GEL Apply 4 g topically 4 (four) times daily. Patient taking differently: Apply 4 g topically 4 (four) times daily as needed (pain). 12/03/21  Yes Lindsay Perna, NP  diphenhydrAMINE (BENADRYL) 25 MG tablet Take 50 mg by mouth as needed for allergies. Allergies   Yes Lindsay [name]  DULoxetine (CYMBALTA) 30 MG capsule Take 1 capsule (30 mg total) by mouth daily. 02/27/22  Yes Lindsay Perna, NP  fluticasone (FLOVENT HFA) 110 MCG/ACT inhaler Inhale 2 puffs into the lungs 2 (two) times daily. 08/28/21  Yes Lindsay Perna, NP  gabapentin (NEURONTIN) 300 MG capsule Take 2 capsules (600 mg total) by mouth 3 (three) times daily. 05/21/22  Yes Lindsay Perna, NP  HUMULIN R U-500 KWIKPEN 500 UNIT/ML kwikpen Inject 75-80 Units into the skin 3 (three) times daily with meals. AM 80 units  Noon 75 units  Evening 75 units If BG above 200 add 5 units to prescribed dose 09/30/20  Yes Lindsay [name]  hydrALAZINE (APRESOLINE) 100 MG tablet Take 1 tablet (100 mg total) by mouth 3 (three) times daily. 02/13/22  Yes Lindsay Perna, NP  ibuprofen (ADVIL) 600 MG tablet Take 1 tablet (600 mg total) by mouth every 8 (eight) hours as needed. Patient taking differently: Take 600 mg by mouth every 8 (eight) hours as needed for mild pain or headache. 07/24/22  Yes Lindsay Perna, NP  isosorbide mononitrate (IMDUR) 60 MG 24 hr  tablet Take 1 tablet (60 mg total) by mouth daily. 08/01/21  Yes [name], [name], [name]  liraglutide (VICTOZA) 18 MG/3ML SOPN Inject 1.8 mg into the skin at bedtime.  07/26/19  Yes Lindsay [name]  losartan (COZAAR) 100 MG tablet TAKE 1 TABLET BY MOUTH ONCE DAILY 11/19/21  Yes [name], [name]  Melatonin 10 MG TABS Take 3 tablets by mouth at bedtime as needed (sleep).   Yes Lindsay [name]  methocarbamol (ROBAXIN) 500 MG tablet Take 1.5 tablets (750 mg total) by mouth every 8 (eight) hours as needed for muscle spasms. 12/26/21  Yes Persons, Bevely Palmer, PA  metoprolol tartrate (LOPRESSOR)  50 MG tablet Take 1 tablet (50 mg total) by mouth 2 (two) times daily. 07/31/21  Yes [name], [name], [name]  Multiple Vitamins-Minerals (MULTIVITAMIN PO) Take 1 tablet by mouth daily.   Yes Lindsay [name]  nitroGLYCERIN (NITROSTAT) 0.4 MG SL tablet Place 1 tablet (0.4 mg total) under the tongue every 5 (five) minutes as needed for chest pain. Please keep upcoming appt in October 2022. Final Attempt 06/19/21  Yes [name], [name]  potassium chloride SA (KLOR-CON) 20 MEQ tablet Take 1 tablet (20 mEq total) by mouth daily. 08/15/21  Yes [name], [name]  sodium chloride (OCEAN) 0.65 % nasal spray Place 1 spray into the nose as needed for congestion. 02/19/17  Yes Lindsay [name]  torsemide (DEMADEX) 20 MG tablet Take by mouth 2 (two) times daily. Dose Change: Take 2 tabs (40 mg total) twice a day. 08/15/21  Yes Lindsay [name]  Accu-Chek FastClix Lancets MISC USE 1 TO CHECK GLUCOSE 4 TIMES DAILY 01/26/20   Charlott Rakes, [name]  BD PEN NEEDLE NANO 2ND GEN 32G X 4 MM MISC Use to inject into the skin in the morning, at noon, and at bedtime as directed. 02/25/22   Lindsay Perna, NP  BD VEO INSULIN SYRINGE U/F 31G X 15/64" 1 ML MISC USE  SYRINGE ONCE DAILY 06/07/18   Elayne Snare, [name]  Blood Glucose Monitoring Suppl (ACCU-CHEK AVIVA PLUS) w/Device KIT 1 each by Does not apply route as  directed. 04/15/18   Elayne Snare, [name]  Continuous Blood Gluc Transmit (DEXCOM G6 TRANSMITTER) MISC Inject into the skin. 06/09/21   Lindsay [name]  glucose blood (ACCU-CHEK AVIVA PLUS) test strip Use as instructed to test blood sugar 4 times daily. 01/11/19   [name], NP  Insulin Pen Needle (B-D UF III MINI PEN NEEDLES) 31G X 5 MM MISC Use as instructed. Monitor blood glucose levels three times per day 04/13/19   Lindsay Perna, NP    Inpatient Medications: Scheduled Meds:  aspirin  81 mg Oral Daily   atorvastatin  20 mg Oral Daily   budesonide (PULMICORT) nebulizer solution  0.25 mg Nebulization BID   DULoxetine  30 mg Oral Daily   enoxaparin (LOVENOX) injection  40 mg Subcutaneous Q24H   gabapentin  600 mg Oral TID   hydrALAZINE  100 mg Oral TID   insulin aspart  0-20 Units Subcutaneous TID WC   insulin aspart  0-5 Units Subcutaneous QHS   insulin regular human CONCENTRATED  75 Units Subcutaneous Q lunch   insulin regular human CONCENTRATED  75 Units Subcutaneous Q supper   insulin regular human CONCENTRATED  80 Units Subcutaneous Q breakfast   isosorbide mononitrate  60 mg Oral Daily   melatonin  10 mg Oral QHS   metoprolol tartrate  50 mg Oral BID   sodium chloride flush  3 mL Intravenous Q12H   Continuous Infusions:  PRN Meds: albuterol, baclofen, bisacodyl, hydrALAZINE, methocarbamol, morphine injection, ondansetron **OR** ondansetron (ZOFRAN) IV, polyethylene glycol, traZODone  Allergies:    Allergies  Allergen Reactions   Acetaminophen Itching and Swelling    Itching of the mouth, swelling of tongue and stomach started hurting mild   Hydrazine Yellow [Tartrazine] Shortness Of Breath and Swelling    Swelling mostly noticed in legs and feet, retaining urination, shortness of breaht, and minor chest pain   Lisinopril Shortness Of Breath    Was on prinzide; had sob/chest pain on it.   Pineapple Itching  Social History:   Social History    Socioeconomic History   Marital status: Single    Spouse name: Not on file   Number of children: Not on file   Years of education: Not on file   Highest education level: Not on file  Occupational History   Occupation: disabled  Tobacco Use   Smoking status: Former    Packs/day: 0.25    Years: 2.00    Total pack years: 0.50    Types: Cigarettes    Quit date: 2008    Years since quitting: 15.6   Smokeless tobacco: Never  Vaping Use   Vaping Use: Never used  Substance and Sexual Activity   Alcohol use: Yes    Comment: socially   Drug use: No   Sexual activity: Not on file  Other Topics Concern   Not on file  Social History Narrative   Not on file   Social Determinants of Health   Financial Resource Strain: Low Risk  (10/22/2021)   Overall Financial Resource Strain (CARDIA)    Difficulty of Paying Living Expenses: Not hard at all  Food Insecurity: No Food Insecurity (10/22/2021)   Hunger Vital Sign    Worried About Running Out of Food in the Last Year: Never true    [place] in the Last Year: Never true  Transportation Needs: No Transportation Needs (10/22/2021)   PRAPARE - Hydrologist (Medical): No    Lack of Transportation (Non-Medical): No  Physical Activity: Insufficiently Active (10/22/2021)   Exercise Vital Sign    Days of Exercise per Week: 3 days    Minutes of Exercise per Session: 20 min  Stress: Stress Concern Present (10/22/2021)   [place]    Feeling of Stress : To some extent  Social Connections: Unknown (10/22/2021)   Social Connection and Isolation Panel [NHANES]    Frequency of Communication with Friends and Family: More than three times a week    Frequency of Social Gatherings with Friends and Family: More than three times a week    Attends Religious Services: More than 4 times per year    Active Member of [employer] or Organizations: Yes    Attends English as a second language teacher Meetings: 1 to 4 times per year    Marital Status: Not on file  Intimate Partner Violence: Not on file    Family History:   Family History  Problem Relation Age of Onset   Diabetes Mother    Hypertension Mother    Heart disease Mother    Diabetes Father    Heart disease Father    Stroke Maternal Grandmother    Cancer Maternal Grandmother    Breast cancer Cousin        68's   Breast cancer Cousin        32's     ROS:  Please see the history of present illness.  All other ROS reviewed and negative.     Physical Exam/Data:   Vitals:   08/16/22 0600 08/16/22 0800 08/16/22 0828 08/16/22 0911  BP:  137/71 137/71   Pulse: 87 83    Resp: 20 (!) 23    Temp:  98.7 F (37.1 C) 98.7 F (37.1 C)   TempSrc:  Oral Oral   SpO2: (!) 84% 94% 96% 95%  Weight:      Height:        Intake/Output Summary (Last 24 hours) at 08/16/2022  1258 Last data filed at 08/16/2022 1235 Gross per 24 hour  Intake 477 ml  Output 800 ml  Net -323 ml      08/16/2022    4:30 AM 08/15/2022   10:22 PM 08/15/2022    9:05 AM  Last 3 Weights  Weight (lbs) 443 lb 4.8 oz 445 lb 1.6 oz 420 lb  Weight (kg) 201.08 kg 201.896 kg 190.511 kg     Body mass index is 68.41 kg/m.  General:  Well nourished, morbidly obese female, in no acute distress HEENT: normal Neck: no JVD seen, difficult to assess secondary to body habitus Vascular: No carotid bruits; Distal pulses 2+ bilaterally Cardiac:  normal S1, S2; RRR; no murmur  Lungs: Scattered rales bilaterally, no wheezing, rhonchi Abd: soft, nontender, no hepatomegaly  Ext: Trace edema Musculoskeletal:  No deformities, BUE and BLE strength normal and equal Skin: warm and dry  Neuro:  CNs 2-12 intact, no focal abnormalities noted Psych:  Normal affect   EKG:  The EKG was personally reviewed and demonstrates: Sinus rhythm, heart rate 98, low voltage precordial leads no significant change from 07/12/2021 ECG Telemetry:  Telemetry was personally  reviewed and demonstrates: Sinus rhythm, no significant ectopy  Relevant CV Studies:  ECHO: 08/16/2022  1. Technically difficult study, poor echo windows. Definity contrast  given   2. Left ventricular ejection fraction, by estimation, is 65 to 70%. Left  ventricular ejection fraction by PLAX is 65 %. The left ventricle has  normal function. The left ventricle has no regional wall motion  abnormalities. There is mild left ventricular hypertrophy. Left ventricular diastolic parameters are indeterminate.   3. Right ventricular systolic function is normal. The right ventricular  size is normal.   4. The mitral valve is abnormal. Trivial mitral valve regurgitation.   5. The aortic valve was not well visualized. Aortic valve regurgitation  is not visualized.   6. The inferior vena cava is normal in size with greater than 50%  respiratory variability, suggesting right atrial pressure of 3 mmHg.   Comparison(s): No significant change from prior study, 07/12/2021  Laboratory Data:  High Sensitivity Troponin:   Recent Labs  Lab 08/14/22 2259 08/15/22 0038  TROPONINIHS 8 8     Chemistry Recent Labs  Lab 08/14/22 2259 08/16/22 0343  NA 142 138  K 4.1 4.6  CL 104 103  CO2 29 23  GLUCOSE 101* 279*  BUN 22* 18  CREATININE 1.16* 1.38*  CALCIUM 9.4 9.1  GFRNONAA >60 50*  ANIONGAP 9 12    Lab Results  Component Value Date   ALT 14 03/05/2022   AST 24 03/05/2022   ALKPHOS 84 03/05/2022   BILITOT 0.4 03/05/2022    Lipids  Lab Results  Component Value Date   CHOL 131 03/05/2022   HDL 42 03/05/2022   LDLCALC 66 03/05/2022   TRIG 129 03/05/2022   CHOLHDL 3.1 03/05/2022     Hematology Recent Labs  Lab 08/14/22 2259 08/16/22 0343  WBC 7.8 8.6  RBC 3.93 3.84*  HGB 10.5* 10.4*  HCT 33.8* 32.9*  MCV 86.0 85.7  MCH 26.7 27.1  MCHC 31.1 31.6  RDW 14.5 14.8  PLT 227 278   Thyroid  Lab Results  Component Value Date   TSH 2.930 05/28/2017   Lab Results  Component  Value Date   HGBA1C 10.0 (H) 08/15/2022   BNP Recent Labs  Lab 08/15/22 0230  BNP 77.3    DDimer No results for input(s): "DDIMER" in the  last 168 hours. Urinalysis    Component Value Date/Time   COLORURINE STRAW (A) 08/15/2022 0351   APPEARANCEUR CLEAR 08/15/2022 0351   LABSPEC 1.008 08/15/2022 0351   PHURINE 6.0 08/15/2022 0351   GLUCOSEU NEGATIVE 08/15/2022 0351   GLUCOSEU >=1000 (A) 02/18/2017 1018   HGBUR NEGATIVE 08/15/2022 0351   BILIRUBINUR NEGATIVE 08/15/2022 0351   BILIRUBINUR negative 06/10/2016 1250   KETONESUR NEGATIVE 08/15/2022 0351   PROTEINUR 30 (A) 08/15/2022 0351   UROBILINOGEN 0.2 02/18/2017 1018   NITRITE NEGATIVE 08/15/2022 0351   LEUKOCYTESUR NEGATIVE 08/15/2022 0351      Radiology/Studies:  ECHOCARDIOGRAM COMPLETE  Result Date: 08/15/2022    ECHOCARDIOGRAM REPORT   Patient Name:   AMEIA MORENCY Date of Exam: 08/15/2022 Medical Rec #:  [id number]         Height:       67.0 in Accession #:    [id number]        Weight:       420.0 lb Date of Birth:  06/16/1981         BSA:          2.772 m Patient Age:    11 years          BP:           156/73 mmHg Patient Gender: F                 HR:           81 bpm. Exam Location:  Inpatient Procedure: 2D Echo Indications:    acute systolic heart failure  History:        Patient has prior history of Echocardiogram examinations, most                 recent 07/12/2021. CAD; Risk Factors:Diabetes, Dyslipidemia and                 Hypertension.  Sonographer:    Johny Chess RDCS Referring Phys: [id number] JENNIFER YATES  Sonographer Comments: Technically difficult study due to poor echo windows and patient is obese. Image acquisition challenging due to patient body habitus. IMPRESSIONS  1. Technically difficult study, poor echo windows. Definity contrast given  2. Left ventricular ejection fraction, by estimation, is 65 to 70%. Left ventricular ejection fraction by PLAX is 65 %. The left ventricle has normal function. The left  ventricle has no regional wall motion abnormalities. There is mild left ventricular hypertrophy. Left ventricular diastolic parameters are indeterminate.  3. Right ventricular systolic function is normal. The right ventricular size is normal.  4. The mitral valve is abnormal. Trivial mitral valve regurgitation.  5. The aortic valve was not well visualized. Aortic valve regurgitation is not visualized.  6. The inferior vena cava is normal in size with greater than 50% respiratory variability, suggesting right atrial pressure of 3 mmHg. Comparison(s): No significant change from prior study. 07/12/2021: LVEF 65-70%, mild LVH. FINDINGS  Left Ventricle: Left ventricular ejection fraction, by estimation, is 65 to 70%. Left ventricular ejection fraction by PLAX is 65 %. The left ventricle has normal function. The left ventricle has no regional wall motion abnormalities. Definity contrast agent was given IV to delineate the left ventricular endocardial borders. The left ventricular internal cavity size was normal in size. There is mild left ventricular hypertrophy. Left ventricular diastolic parameters are indeterminate. Right Ventricle: The right ventricular size is normal. No increase in right ventricular wall thickness. Right ventricular systolic function is normal. Left Atrium: Left atrial  size was normal in size. Right Atrium: Right atrial size was normal in size. Pericardium: There is no evidence of pericardial effusion. Mitral Valve: The mitral valve is abnormal. Mild mitral annular calcification. Trivial mitral valve regurgitation. Tricuspid Valve: The tricuspid valve is not well visualized. Tricuspid valve regurgitation is not demonstrated. Aortic Valve: The aortic valve was not well visualized. Aortic valve regurgitation is not visualized. Pulmonic Valve: The pulmonic valve was not well visualized. Pulmonic valve regurgitation is not visualized. Aorta: The aortic root and ascending aorta are structurally normal,  with no evidence of dilitation. Venous: The inferior vena cava is normal in size with greater than 50% respiratory variability, suggesting right atrial pressure of 3 mmHg. IAS/Shunts: No atrial level shunt detected by color flow Doppler.  LEFT VENTRICLE PLAX 2D LV EF:         Left            Diastology                ventricular     LV e' medial:    11.40 cm/s                ejection        LV E/e' medial:  8.1                fraction by     LV e' lateral:   9.79 cm/s                PLAX is 65      LV E/e' lateral: 9.4                %. LVIDd:         4.80 cm LVIDs:         3.10 cm LV PW:         1.20 cm LV IVS:        1.20 cm LVOT diam:     1.90 cm LV SV:         58 LV SV Index:   21 LVOT Area:     2.84 cm  RIGHT VENTRICLE             IVC RV S prime:     15.30 cm/s  IVC diam: 2.00 cm TAPSE (M-mode): 2.0 cm LEFT ATRIUM           Index        RIGHT ATRIUM           Index LA diam:      4.30 cm 1.55 cm/m   RA Area:     11.90 cm LA Vol (A4C): 57.2 ml 20.64 ml/m  RA Volume:   25.60 ml  9.24 ml/m  AORTIC VALVE LVOT Vmax:   116.00 cm/s LVOT Vmean:  73.500 cm/s LVOT VTI:    0.203 m  AORTA Ao Root diam: 2.80 cm Ao Asc diam:  3.30 cm MITRAL VALVE MV Area (PHT): 3.77 cm    SHUNTS MV Decel Time: 201 msec    Systemic VTI:  0.20 m MV E velocity: 92.40 cm/s  Systemic Diam: 1.90 cm MV A velocity: 76.50 cm/s MV E/A ratio:  1.21 Lyman Bishop [name] Electronically signed by Lyman Bishop [name] Signature Date/Time: 08/15/2022/4:57:21 PM    Final    DG Chest 2 View  Result Date: 08/14/2022 CLINICAL DATA:  Chest pain, shortness of breath. EXAM: CHEST - 2 VIEW COMPARISON:  Chest radiograph dated July 17, 2021 FINDINGS: The heart is enlarged.  Prominence of the central pulmonary vessels. Bilateral hazy lung opacities concerning for pulmonary edema. No appreciable pleural effusion. IMPRESSION: Cardiomegaly with pulmonary edema suggesting congestive heart failure, similar to prior examination. Electronically Signed   By: Keane Police D.O.    On: 08/14/2022 23:25     Assessment and Plan:   Acute on chronic diastolic CHF - pt wt is up > 20 lbs above dry wt - she got some improvement w/ IV Lasix but I/O not well tracked  - at home was on torsemide 40 mg bid - will give 80 mg IV Lasix bid and follow response  2. Fever, chills - in ER, T 101.3 - no source on CXR or UA - fever has improved, WBCs normal - mgt per IM  3. CAD -  stent patent and no other sig CAD at cath 2021. - continue BB, Imdur, ASA, statin - no ischemic sx.    Risk Assessment/Risk Scores:       New York Heart Association (NYHA) Functional Class NYHA Class IV    For questions or updates, please contact [place] Please consult [email or web address] for contact info under    Signed, Rosaria Ferries, PA-C  08/16/2022 12:58 PM

## 2022-08-16 NOTE — Progress Notes (Signed)
Nutrition Brief Note  RD received consult for nutritional goals  41 y/o female with h/o mood disorder, CAD, DM, HTN, HLD and morbid obesity who is admitted with SOB.   Wt Readings from Last 15 Encounters:  08/16/22 (!) 201.1 kg  03/05/22 (!) 191.1 kg  12/26/21 (!) 195 kg  11/05/21 (!) 190.8 kg  08/15/21 (!) 193.1 kg  07/31/21 (!) 188.5 kg  06/03/21 (!) 188.6 kg  12/05/20 (!) 186.7 kg  06/04/20 (!) 191.2 kg  05/13/20 (!) 187.2 kg  04/25/20 (!) 190.1 kg  04/19/20 (!) 190.1 kg  04/01/20 (!) 197.7 kg  12/05/19 (!) 196.9 kg  10/05/19 (!) 189.1 kg   Body mass index is 68.41 kg/m. Patient meets criteria for morbid obesity based on current BMI.   Current diet order is HH/CHO, patient is consuming approximately 100% of meals at this time. Labs and medications reviewed.   No nutrition interventions warranted at this time. If nutrition issues arise, please consult RD.   Recommend referral to [facility] for weight loss education.   Betsey Holiday MS, RD, LDN Please refer to [facility] for RD and/or RD on-call/weekend/after hours pager

## 2022-08-16 NOTE — Progress Notes (Signed)
[facility]  PROGRESS NOTE  KEM PARCHER KGM:[id number] DOB: 1981-03-24 DOA: 08/14/2022 PCP: Grayce Sessions, NP   Brief HPI:   41 year old female with a history of CAD s/p stent placement, diabetes mellitus type 2, hypertension, hyperlipidemia, HFpEF, morbid obesity presented with shortness of breath on exertion.  Patient says that she takes Lasix 40 mg twice a day at home, no recent medication changes.  Did not miss her doses. She was given Lasix 40 mg IV x1 in the ED.  Chest x-ray had shown cardiomegaly with pulmonary edema.    Subjective   Patient still complains of dyspnea on exertion.   Assessment/Plan:     Dyspnea on exertion -Patient presented with CHF exacerbation -Given 1 dose of Lasix 40 mg IV -Creatinine jumped to 1.38 today -She takes torsemide 40 mg p.o. twice daily at home -Echocardiogram showed EF of 65 to 70%, indeterminate diastolic dysfunction -We will consult cardiology for further recommendations  Acute kidney injury -Creatinine jumped from 1.16-1.38 with Lasix today -Follow BMP in am -Cardiology consulted for further diuresis as above  Hypertension -Continue hydralazine, metoprolol -Losartan on hold  Hyperlipidemia -Continue Lipitor  Diabetes mellitus type 2 -Hemoglobin A1c is 10 -Continue resistant sliding scale insulin with NovoLog -U500 80 units qAM, 75 units qnoon, 75 units qhs  CAD s/p stent placement -Continue aspirin, Imdur  Chronic back pain -Continue baclofen, Neurontin, Robaxin  Mood disorder -Continue Cymbalta, melatonin  Morbid obesity -BMI 65.78 kg/m -Should have evaluation as outpatient for bariatric medicine/surgery    Medications     aspirin  81 mg Oral Daily   atorvastatin  20 mg Oral Daily   budesonide (PULMICORT) nebulizer solution  0.25 mg Nebulization BID   DULoxetine  30 mg Oral Daily   enoxaparin (LOVENOX) injection  100 mg Subcutaneous Q24H   furosemide  80 mg Intravenous BID    gabapentin  600 mg Oral TID   hydrALAZINE  100 mg Oral TID   insulin aspart  0-20 Units Subcutaneous TID WC   insulin aspart  0-5 Units Subcutaneous QHS   insulin regular human CONCENTRATED  75 Units Subcutaneous Q lunch   insulin regular human CONCENTRATED  75 Units Subcutaneous Q supper   insulin regular human CONCENTRATED  80 Units Subcutaneous Q breakfast   isosorbide mononitrate  60 mg Oral Daily   melatonin  10 mg Oral QHS   metoprolol tartrate  50 mg Oral BID   sodium chloride flush  3 mL Intravenous Q12H     Data Reviewed:   CBG:  Recent Labs  Lab 08/15/22 1658 08/15/22 1906 08/15/22 2257 08/16/22 0808 08/16/22 1152  GLUCAP 152* 232* 224* 236* 257*    SpO2: 95 %    Vitals:   08/16/22 0800 08/16/22 0828 08/16/22 0911 08/16/22 1544  BP: 137/71 137/71  (!) 157/80  Pulse: 83   77  Resp: (!) 23   15  Temp: 98.7 F (37.1 C) 98.7 F (37.1 C)  98.5 F (36.9 C)  TempSrc: Oral Oral  Oral  SpO2: 94% 96% 95% 95%  Weight:      Height:          Data Reviewed:  Basic Metabolic Panel: Recent Labs  Lab 08/14/22 2259 08/16/22 0343  NA 142 138  K 4.1 4.6  CL 104 103  CO2 29 23  GLUCOSE 101* 279*  BUN 22* 18  CREATININE 1.16* 1.38*  CALCIUM 9.4 9.1    CBC: Recent Labs  Lab 08/14/22 2259 08/16/22 0343  WBC 7.8 8.6  HGB 10.5* 10.4*  HCT 33.8* 32.9*  MCV 86.0 85.7  PLT 227 278    LFT No results for input(s): "AST", "ALT", "ALKPHOS", "BILITOT", "PROT", "ALBUMIN" in the last 168 hours.   Antibiotics: Anti-infectives (From admission, onward)    Start     Dose/Rate Route Frequency Ordered Stop   08/15/22 0100  cefTRIAXone (ROCEPHIN) 2 g in sodium chloride 0.9 % 100 mL IVPB        2 g 200 mL/hr over 30 Minutes Intravenous  Once 08/15/22 0054 08/15/22 0150   08/15/22 0100  azithromycin (ZITHROMAX) 500 mg in sodium chloride 0.9 % 250 mL IVPB  Status:  Discontinued        500 mg 250 mL/hr over 60 Minutes Intravenous Every 24 hours 08/15/22 0054  08/15/22 1012        DVT prophylaxis: Full code  Code Status: Full code  Family Communication: No family at bedside   CONSULTS    Objective    Physical Examination:   General-appears in no acute distress Heart-S1-S2, regular, no murmur auscultated Lungs-decreased breath sounds at lung bases Abdomen-soft, nontender, no organomegaly Extremities-no edema in the lower extremities Neuro-alert, oriented x3, no focal deficit noted  Status is: Inpatient:             Meredeth Ide   [facility] If 7PM-7AM, please contact night-coverage at [email or web address], Office  [phone]   08/16/2022, 4:14 PM  LOS: 1 day

## 2022-08-16 NOTE — Evaluation (Signed)
Physical Therapy Evaluation Patient Details Name: Lindsay [name] MRN: [id number] DOB: 12-15-1981 Today's Date: 08/16/2022  History of Present Illness  41 y.o. female presents to [facility] hospital on 08/15/2022 with chest pain and SOB. Pt admitted for observation with concern for possible CHF. PMH includes acute renal failure, asthma, CAD, DM, HLD, HTN.  Clinical Impression  Pt presents to PT with deficits in cardiopulmonary endurance and activity tolerance. Pt reports increased work of breathing compared to similar workloads at her baseline. Pt is able to ambulate for household distances with use of an assistive device. PT encourages multiple bouts of ambulation daily during this admission. PT anticipates a quick return to baseline and recommends no post-acute PT follow-up.     Recommendations for follow up therapy are one component of a multi-disciplinary discharge planning process, led by the attending physician.  Recommendations may be updated based on patient status, additional functional criteria and insurance authorization.  Follow Up Recommendations No PT follow up      Assistance Recommended at Discharge PRN  Patient can return home with the following  A little help with bathing/dressing/bathroom;Help with stairs or ramp for entrance    Equipment Recommendations None recommended by PT  Recommendations for Other Services       Functional Status Assessment Patient has had a recent decline in their functional status and demonstrates the ability to make significant improvements in function in a reasonable and predictable amount of time.     Precautions / Restrictions Precautions Precautions: Other (comment) Precaution Comments: monitor sats and DOE Restrictions Weight Bearing Restrictions: No      Mobility  Bed Mobility Overal bed mobility: Modified Independent             General bed mobility comments: HOB elevated, increased time    Transfers Overall transfer level:  Modified independent Equipment used: Rollator (4 wheels)                    Ambulation/Gait Ambulation/Gait assistance: Supervision Gait Distance (Feet): 100 Feet (100' x 2 trials) Assistive device: Rollator (4 wheels) Gait Pattern/deviations: Step-through pattern Gait velocity: functional Gait velocity interpretation: <1.8 ft/sec, indicate of risk for recurrent falls   General Gait Details: pt with steady step-through gait, one seated rest break separating trials due to fatigue  Stairs            Wheelchair Mobility    Modified Rankin (Stroke Patients Only)       Balance Overall balance assessment: Needs assistance Sitting-balance support: No upper extremity supported, Feet supported Sitting balance-Leahy Scale: Good     Standing balance support: Single extremity supported, Reliant on assistive device for balance Standing balance-Leahy Scale: Poor                               Pertinent Vitals/Pain Pain Assessment Pain Assessment: 0-10 Pain Score: 6  Pain Location: back Pain Descriptors / Indicators: Aching Pain Intervention(s): Monitored during session    Home Living Family/patient expects to be discharged to:: Private residence Living Arrangements: Children Available Help at Discharge: Family;Available PRN/intermittently Type of Home: Apartment Home Access: Stairs to enter Entrance Stairs-Rails: None Entrance Stairs-Number of Steps: 2   Home Layout: One level Home Equipment: Rollator (4 wheels);Cane - quad      Prior Function Prior Level of Function : Needs assist             Mobility Comments: ambulates within the home with PRN  use of quad cane, utilizes rollator in the community. ADLs Comments: daughter assists with peri care     Hand Dominance   Dominant Hand: Right    Extremity/Trunk Assessment   Upper Extremity Assessment Upper Extremity Assessment: Overall WFL for tasks assessed    Lower Extremity  Assessment Lower Extremity Assessment: Overall WFL for tasks assessed    Cervical / Trunk Assessment Cervical / Trunk Assessment: Other exceptions Cervical / Trunk Exceptions: morbid obesity  Communication   Communication: No difficulties  Cognition Arousal/Alertness: Awake/alert Behavior During Therapy: WFL for tasks assessed/performed Overall Cognitive Status: Within Functional Limits for tasks assessed                                          General Comments General comments (skin integrity, edema, etc.): VSS on RA, sats in 90s. Pt reports increased exertion and DOE compared to baseline for similar ambulation distances    Exercises     Assessment/Plan    PT Assessment Patient needs continued PT services  PT Problem List Decreased activity tolerance;Cardiopulmonary status limiting activity       PT Treatment Interventions Gait training;Therapeutic exercise;Therapeutic activities;Patient/family education    PT Goals (Current goals can be found in the Care Plan section)  Acute Rehab PT Goals Patient Stated Goal: to improve activity tolerance PT Goal Formulation: With patient Time For Goal Achievement: 08/30/22 Potential to Achieve Goals: Fair Additional Goals Additional Goal #1: Pt will ambulate for >250', repoting 1/4 DOE or less to demonstrate improved activity tolerance    Frequency Min 2X/week     Co-evaluation               AM-PAC PT "6 Clicks" Mobility  Outcome Measure Help needed turning from your back to your side while in a flat bed without using bedrails?: None Help needed moving from lying on your back to sitting on the side of a flat bed without using bedrails?: None Help needed moving to and from a bed to a chair (including a wheelchair)?: None Help needed standing up from a chair using your arms (e.g., wheelchair or bedside chair)?: None Help needed to walk in hospital room?: A Little Help needed climbing 3-5 steps with a  railing? : A Little 6 Click Score: 22    End of Session   Activity Tolerance: Patient tolerated treatment well Patient left: in bed;with call bell/phone within reach Nurse Communication: Mobility status PT Visit Diagnosis: Other abnormalities of gait and mobility (R26.89)    Time: 9211-9417 PT Time Calculation (min) (ACUTE ONLY): 28 min   Charges:   PT Evaluation $PT Eval Low Complexity: 1 Low          Arlyss Gandy, PT, DPT Acute Rehabilitation Office [phone]   Arlyss Gandy 08/16/2022, 11:04 AM

## 2022-08-16 NOTE — Progress Notes (Signed)
OT Cancellation Note  Patient Details Name: Lindsay [name] MRN: [id number] DOB: May 22, 1981   Cancelled Treatment:    Reason Eval/Treat Not Completed: Pain limiting ability to participate (patient reporting 10/10 pain)  [name] 08/16/2022, 2:11 PM

## 2022-08-17 DIAGNOSIS — R0602 Shortness of breath: Secondary | ICD-10-CM | POA: Diagnosis not present

## 2022-08-17 LAB — BASIC METABOLIC PANEL
Anion gap: 8 (ref 5–15)
BUN: 21 mg/dL — ABNORMAL HIGH (ref 6–20)
CO2: 26 mmol/L (ref 22–32)
Calcium: 9 mg/dL (ref 8.9–10.3)
Chloride: 105 mmol/L (ref 98–111)
Creatinine, Ser: 1.24 mg/dL — ABNORMAL HIGH (ref 0.44–1.00)
GFR, Estimated: 56 mL/min — ABNORMAL LOW (ref >60)
Glucose, Bld: 63 mg/dL — ABNORMAL LOW (ref 70–99)
Potassium: 4.1 mmol/L (ref 3.5–5.1)
Sodium: 139 mmol/L (ref 135–145)

## 2022-08-17 LAB — GLUCOSE, CAPILLARY
Glucose-Capillary: 65 mg/dL — ABNORMAL LOW (ref 70–99)
Glucose-Capillary: 130 mg/dL — ABNORMAL HIGH (ref 70–99)
Glucose-Capillary: 138 mg/dL — ABNORMAL HIGH (ref 70–99)
Glucose-Capillary: 205 mg/dL — ABNORMAL HIGH (ref 70–99)
Glucose-Capillary: 191 mg/dL — ABNORMAL HIGH (ref 70–99)

## 2022-08-17 LAB — PROCALCITONIN: Procalcitonin: <0.1 ng/mL

## 2022-08-17 MED ORDER — ACETAMINOPHEN 325 MG PO TABS
650.0000 mg | ORAL_TABLET | Freq: Four times a day (QID) | ORAL | Status: DC | PRN
  Filled 2022-08-17 (×2): qty 2

## 2022-08-17 MED ORDER — GUAIFENESIN 100 MG/5ML PO LIQD: 5.0000 mL | ORAL | Status: DC | PRN

## 2022-08-17 MED ORDER — METOPROLOL TARTRATE 5 MG/5ML IV SOLN: 5.0000 mg | INTRAVENOUS | Status: DC | PRN

## 2022-08-17 MED ORDER — SENNOSIDES-DOCUSATE SODIUM 8.6-50 MG PO TABS: 1.0000 | ORAL_TABLET | Freq: Every evening | ORAL | Status: DC | PRN

## 2022-08-17 MED ORDER — INSULIN REGULAR HUMAN (CONC) 500 UNIT/ML ~~LOC~~ SOPN
70.0000 [IU] | PEN_INJECTOR | Freq: Every day | SUBCUTANEOUS | Status: DC
  Administered 2022-08-17 – 2022-08-23 (×7): 70 [IU] via SUBCUTANEOUS

## 2022-08-17 MED ORDER — LIVING WELL WITH DIABETES BOOK
Freq: Once | Status: AC
  Filled 2022-08-17: qty 1

## 2022-08-17 MED ORDER — POLYVINYL ALCOHOL 1.4 % OP SOLN: 1.0000 [drp] | OPHTHALMIC | Status: DC | PRN

## 2022-08-17 MED ORDER — HYDRALAZINE HCL 20 MG/ML IJ SOLN: 10.0000 mg | INTRAMUSCULAR | Status: DC | PRN

## 2022-08-17 MED ORDER — IPRATROPIUM-ALBUTEROL 0.5-2.5 (3) MG/3ML IN SOLN: 3.0000 mL | RESPIRATORY_TRACT | Status: DC | PRN

## 2022-08-17 NOTE — Inpatient Diabetes Management (Signed)
Inpatient Diabetes Program Recommendations  AACE/ADA: New Consensus Statement on Inpatient Glycemic Control (2015)  Target Ranges:  Prepandial:   less than 140 mg/dL      Peak postprandial:   less than 180 mg/dL (1-2 hours)      Critically ill patients:  140 - 180 mg/dL   Lab Results  Component Value Date   GLUCAP 138 (H) 08/17/2022   HGBA1C 10.0 (H) 08/15/2022   Spoke with patient at bedside.  Reviewed patient's current A1c of 10% (average glucose of 240 mg/dL). Explained what a A1c is and what it measures. Also reviewed goal A1c with patient, importance of good glucose control @ home, and blood sugar goals.   She states her A1C is anywhere from 7.8-14%.  She is current with endocrinology at [facility] in [place]. She uses U500 TID 80 units QAM, 75 with lunch and 75 with supper.  Add 5 units if > 200 mg/dL.  She wears a Dexcom CGM.  She denies skipping doses and she rotates her sites.    She drinks mostly water but admits to drinking sodas, juice and milk occasionally.  Her favorite food is spaghetti.  Educated on The Plate Method, CHO's, portion control, CBGs at home fasting and mid afternoon, F/U with PCP every 3 months, bring meter to PCP office, long and short term complications of uncontrolled BG, and importance of exercise.  She states she stays thirsty and she should be limiting her fluid intake due to history of CHF.  Explained that elevated glucose will result in thirst.  Getting her glucose closer to 150's could be a goal for her to help decrease thirst resulting in less fluid intake.  Ordered Living Well with Diabetes booklet.    Will continue to follow while inpatient.  Thank you, Dulce Sellar, MSN, CDCES Diabetes Coordinator Inpatient Diabetes Program [phone] (team pager from 8a-5p)

## 2022-08-17 NOTE — Progress Notes (Signed)
Patient able to place self on CPAP. Aware to call for assistance if needed.

## 2022-08-17 NOTE — Progress Notes (Signed)
Rounding Note    Patient Name: Lindsay [name] Date of Encounter: 08/17/2022  [place] Cardiologist: Sinclair Grooms, MD    Subjective   41 year old female with history of diabetes mellitus, hypertension, hyperlipidemia, non-ST segment elevation myocardial infarction, status post DES to mid LAD and angioplasty to the D2.  She has chronic diastolic congestive heart failure, super morbid obesity.  BMI is 68.  Troponins are 8, 8 BNP is normal at 77.     Inpatient Medications    Scheduled Meds:  aspirin  81 mg Oral Daily   atorvastatin  20 mg Oral Daily   budesonide (PULMICORT) nebulizer solution  0.25 mg Nebulization BID   DULoxetine  30 mg Oral Daily   enoxaparin (LOVENOX) injection  100 mg Subcutaneous Q24H   furosemide  80 mg Intravenous BID   gabapentin  600 mg Oral TID   hydrALAZINE  100 mg Oral TID   insulin aspart  0-20 Units Subcutaneous TID WC   insulin aspart  0-5 Units Subcutaneous QHS   insulin regular human CONCENTRATED  75 Units Subcutaneous Q lunch   insulin regular human CONCENTRATED  75 Units Subcutaneous Q supper   insulin regular human CONCENTRATED  80 Units Subcutaneous Q breakfast   isosorbide mononitrate  60 mg Oral Daily   melatonin  10 mg Oral QHS   metoprolol tartrate  50 mg Oral BID   sodium chloride flush  3 mL Intravenous Q12H   Continuous Infusions:  PRN Meds: acetaminophen, baclofen, bisacodyl, guaiFENesin, hydrALAZINE, ipratropium-albuterol, methocarbamol, metoprolol tartrate, morphine injection, ondansetron **OR** ondansetron (ZOFRAN) IV, polyethylene glycol, senna-docusate, traZODone   Vital Signs    Vitals:   08/16/22 2214 08/17/22 0611 08/17/22 0730 08/17/22 0820  BP: 139/73 (!) 141/71  (!) 146/76  Pulse: 84 78 77 88  Resp: 18 15 15 18   Temp: 98.5 F (36.9 C) 98.5 F (36.9 C)    TempSrc: Oral Axillary    SpO2: 100% 96% 100% 97%  Weight:      Height:        Intake/Output Summary (Last 24 hours) at 08/17/2022  1021 Last data filed at 08/17/2022 0944 Gross per 24 hour  Intake 357 ml  Output 2900 ml  Net -2543 ml      08/16/2022    4:30 AM 08/15/2022   10:22 PM 08/15/2022    9:05 AM  Last 3 Weights  Weight (lbs) 443 lb 4.8 oz 445 lb 1.6 oz 420 lb  Weight (kg) 201.08 kg 201.896 kg 190.511 kg      Telemetry    NSR - Personally Reviewed  ECG     - Personally Reviewed  Physical Exam   GEN: No acute distress.   Neck: No JVD Cardiac: RRR, no murmurs, rubs, or gallops.  Respiratory: Clear to auscultation bilaterally. GI: Soft, nontender, non-distended  MS: No edema; No deformity. Neuro:  Nonfocal  Psych: Normal affect   Labs    High Sensitivity Troponin:   Recent Labs  Lab 08/14/22 2259 08/15/22 0038  TROPONINIHS 8 8     Chemistry Recent Labs  Lab 08/14/22 2259 08/16/22 0343 08/17/22 0708  NA 142 138 139  K 4.1 4.6 4.1  CL 104 103 105  CO2 29 23 26   GLUCOSE 101* 279* 63*  BUN 22* 18 21*  CREATININE 1.16* 1.38* 1.24*  CALCIUM 9.4 9.1 9.0  GFRNONAA >60 50* 56*  ANIONGAP 9 12 8     Lipids No results for input(s): "CHOL", "TRIG", "HDL", "LABVLDL", "  LDLCALC", "CHOLHDL" in the last 168 hours.  Hematology Recent Labs  Lab 08/14/22 2259 08/16/22 0343  WBC 7.8 8.6  RBC 3.93 3.84*  HGB 10.5* 10.4*  HCT 33.8* 32.9*  MCV 86.0 85.7  MCH 26.7 27.1  MCHC 31.1 31.6  RDW 14.5 14.8  PLT 227 278   Thyroid No results for input(s): "TSH", "FREET4" in the last 168 hours.  BNP Recent Labs  Lab 08/15/22 0230  BNP 77.3    DDimer No results for input(s): "DDIMER" in the last 168 hours.   Radiology    ECHOCARDIOGRAM COMPLETE  Result Date: 08/15/2022    ECHOCARDIOGRAM REPORT   Patient Name:   Lindsay [name] Date of Exam: 08/15/2022 Medical Rec #:  [id number]         Height:       67.0 in Accession #:    [id number]        Weight:       420.0 lb Date of Birth:  05/24/1981         BSA:          2.772 m Patient Age:    40 years          BP:           156/73 mmHg Patient  Gender: F                 HR:           81 bpm. Exam Location:  Inpatient Procedure: 2D Echo Indications:    acute systolic heart failure  History:        Patient has prior history of Echocardiogram examinations, most                 recent 07/12/2021. CAD; Risk Factors:Diabetes, Dyslipidemia and                 Hypertension.  Sonographer:    Delcie Roch RDCS Referring Phys: [id number] JENNIFER YATES  Sonographer Comments: Technically difficult study due to poor echo windows and patient is obese. Image acquisition challenging due to patient body habitus. IMPRESSIONS  1. Technically difficult study, poor echo windows. Definity contrast given  2. Left ventricular ejection fraction, by estimation, is 65 to 70%. Left ventricular ejection fraction by PLAX is 65 %. The left ventricle has normal function. The left ventricle has no regional wall motion abnormalities. There is mild left ventricular hypertrophy. Left ventricular diastolic parameters are indeterminate.  3. Right ventricular systolic function is normal. The right ventricular size is normal.  4. The mitral valve is abnormal. Trivial mitral valve regurgitation.  5. The aortic valve was not well visualized. Aortic valve regurgitation is not visualized.  6. The inferior vena cava is normal in size with greater than 50% respiratory variability, suggesting right atrial pressure of 3 mmHg. Comparison(s): No significant change from prior study. 07/12/2021: LVEF 65-70%, mild LVH. FINDINGS  Left Ventricle: Left ventricular ejection fraction, by estimation, is 65 to 70%. Left ventricular ejection fraction by PLAX is 65 %. The left ventricle has normal function. The left ventricle has no regional wall motion abnormalities. Definity contrast agent was given IV to delineate the left ventricular endocardial borders. The left ventricular internal cavity size was normal in size. There is mild left ventricular hypertrophy. Left ventricular diastolic parameters are indeterminate.  Right Ventricle: The right ventricular size is normal. No increase in right ventricular wall thickness. Right ventricular systolic function is normal. Left Atrium: Left atrial size was normal in size.  Right Atrium: Right atrial size was normal in size. Pericardium: There is no evidence of pericardial effusion. Mitral Valve: The mitral valve is abnormal. Mild mitral annular calcification. Trivial mitral valve regurgitation. Tricuspid Valve: The tricuspid valve is not well visualized. Tricuspid valve regurgitation is not demonstrated. Aortic Valve: The aortic valve was not well visualized. Aortic valve regurgitation is not visualized. Pulmonic Valve: The pulmonic valve was not well visualized. Pulmonic valve regurgitation is not visualized. Aorta: The aortic root and ascending aorta are structurally normal, with no evidence of dilitation. Venous: The inferior vena cava is normal in size with greater than 50% respiratory variability, suggesting right atrial pressure of 3 mmHg. IAS/Shunts: No atrial level shunt detected by color flow Doppler.  LEFT VENTRICLE PLAX 2D LV EF:         Left            Diastology                ventricular     LV e' medial:    11.40 cm/s                ejection        LV E/e' medial:  8.1                fraction by     LV e' lateral:   9.79 cm/s                PLAX is 65      LV E/e' lateral: 9.4                %. LVIDd:         4.80 cm LVIDs:         3.10 cm LV PW:         1.20 cm LV IVS:        1.20 cm LVOT diam:     1.90 cm LV SV:         58 LV SV Index:   21 LVOT Area:     2.84 cm  RIGHT VENTRICLE             IVC RV S prime:     15.30 cm/s  IVC diam: 2.00 cm TAPSE (M-mode): 2.0 cm LEFT ATRIUM           Index        RIGHT ATRIUM           Index LA diam:      4.30 cm 1.55 cm/m   RA Area:     11.90 cm LA Vol (A4C): 57.2 ml 20.64 ml/m  RA Volume:   25.60 ml  9.24 ml/m  AORTIC VALVE LVOT Vmax:   116.00 cm/s LVOT Vmean:  73.500 cm/s LVOT VTI:    0.203 m  AORTA Ao Root diam: 2.80 cm Ao Asc  diam:  3.30 cm MITRAL VALVE MV Area (PHT): 3.77 cm    SHUNTS MV Decel Time: 201 msec    Systemic VTI:  0.20 m MV E velocity: 92.40 cm/s  Systemic Diam: 1.90 cm MV A velocity: 76.50 cm/s MV E/A ratio:  1.21 Zoila Shutter MD Electronically signed by Zoila Shutter MD Signature Date/Time: 08/15/2022/4:57:21 PM    Final     Cardiac Studies     Patient Profile     41 y.o. female admitted with dyspnea   Assessment & Plan     Dyspnea:   Troponins are negative.  Repeat be natruretic peptide  is normal.  I suspect that her shortness of breath is due to her morbid obesity.  Her BMI is 68. She is currently getting Lasix 80 mg IV twice a day. Her home dose of torsemide appears to be 40 mg twice a day. Agree with several days of IV lasix and then transition to her home torsemide dosing .   I suspect that most/all of her current symptoms are due to her super morbid obesity.  2.  Coronary artery disease: She is status post PCI in 2021.  No recent episodes of chest discomfort.  Continue current medications.    Tama will sign off.   Medication Recommendations:  would change her IV lasix to torsemide 40 BID tomorrow  Other recommendations (labs, testing, etc):   Follow up as an outpatient:   with Dr. [name] or APP in 1-2 months   For questions or updates, please contact [place] Please consult [email or web address] for contact info under        Signed, Mertie Moores, MD  08/17/2022, 10:21 AM

## 2022-08-17 NOTE — Progress Notes (Signed)
PROGRESS NOTE    Lindsay [name]  SWF:[id number] DOB: 04/27/1981 DOA: 08/14/2022 PCP: Grayce Sessions, NP   Brief Narrative:  41 year old female with a history of CAD s/p stent placement, diabetes mellitus type 2, hypertension, hyperlipidemia, HFpEF, morbid obesity presented with shortness of breath on exertion.  Patient says that she takes Lasix 40 mg twice a day at home, no recent medication changes.  Upon admission patient was started on diuretics, cardiology team was consulted, echo showed EF of 70%.  Initially patient was also febrile but no obvious source was identified.   Assessment & Plan:  Principal Problem:   Shortness of breath Active Problems:   Essential hypertension   AKI (acute kidney injury) (HCC)   Morbid obesity (HCC)   Coronary artery disease involving native coronary artery of native heart with angina pectoris (HCC)   Hyperlipidemia   Uncontrolled type 2 diabetes mellitus with hyperglycemia, with long-term current use of insulin (HCC)   Back pain   Mood disorder (HCC)    Acute diastolic CHF exacerbation, class III.  EF 70%. -Cards - Lasix 80mg  IV bid today, PO torsemide in the next day or two.  Echocardiogram showed EF 70%.  Cardiology team following.   Acute kidney injury -Baseline creatinine 1.1.  Cr 1.38 > 1.24  Fever - No obvious source.  Blood cultures not drawn upon admission but can hold off as she has remained afebrile.  UA negative. ProCal neg.    Hypertension -Continue hydralazine, metoprolol.  As needed hold   Hyperlipidemia -Continue Lipitor   Diabetes mellitus type 2, insulin-dependent.  Uncontrolled due to hyperglycemia -Hemoglobin A1c is 10 -Sliding scale and Accu-Chek.  Getting U-500    CAD s/p stent placement -Continue aspirin, Imdur   Chronic back pain -Continue baclofen, Neurontin, Robaxin   Mood disorder -Continue Cymbalta, melatonin   Morbid obesity -BMI 65.78 kg/m -Should have evaluation as outpatient for bariatric  medicine/surgery      DVT prophylaxis: Lovenox Code Status: Full Family Communication:    Status is: Inpatient Remains inpatient appropriate because: IV diuretics today per cardiology.       Subjective:  Feels a little better. Less sob today.    Examination:  General exam: Appears calm and comfortable ; morbid obesity  Respiratory system: bibasilar crackles. 2+ b/l LE pitting edema Gastrointestinal system: Abdomen is nondistended, soft and nontender. No organomegaly or masses felt. Normal bowel sounds heard. Central nervous system: Alert and oriented. No focal neurological deficits. Extremities: Symmetric 5 x 5 power. Skin: No rashes, lesions or ulcers Psychiatry: Judgement and insight appear normal. Mood & affect appropriate.     Objective: Vitals:   08/16/22 1951 08/16/22 2214 08/17/22 0611 08/17/22 0730  BP:  139/73 (!) 141/71   Pulse: 84 84 78 77  Resp: 15 18 15 15   Temp:  98.5 F (36.9 C) 98.5 F (36.9 C)   TempSrc:  Oral Axillary   SpO2: 100% 100% 96% 100%  Weight:      Height:        Intake/Output Summary (Last 24 hours) at 08/17/2022 0758 Last data filed at 08/16/2022 1700 Gross per 24 hour  Intake 477 ml  Output 1000 ml  Net -523 ml   Filed Weights   08/15/22 0905 08/15/22 2222 08/16/22 0430  Weight: (!) 190.5 kg (!) 201.9 kg (!) 201.1 kg     Data Reviewed:   CBC: Recent Labs  Lab 08/14/22 2259 08/16/22 0343  WBC 7.8 8.6  HGB 10.5* 10.4*  HCT 33.8*  32.9*  MCV 86.0 85.7  PLT 227 278   Basic Metabolic Panel: Recent Labs  Lab 08/14/22 2259 08/16/22 0343  NA 142 138  K 4.1 4.6  CL 104 103  CO2 29 23  GLUCOSE 101* 279*  BUN 22* 18  CREATININE 1.16* 1.38*  CALCIUM 9.4 9.1   GFR: Estimated Creatinine Clearance: 101 mL/min (A) (by C-G formula based on SCr of 1.38 mg/dL (H)). Liver Function Tests: No results for input(s): "AST", "ALT", "ALKPHOS", "BILITOT", "PROT", "ALBUMIN" in the last 168 hours. No results for input(s):  "LIPASE", "AMYLASE" in the last 168 hours. No results for input(s): "AMMONIA" in the last 168 hours. Coagulation Profile: No results for input(s): "INR", "PROTIME" in the last 168 hours. Cardiac Enzymes: No results for input(s): "CKTOTAL", "CKMB", "CKMBINDEX", "TROPONINI" in the last 168 hours. BNP (last 3 results) Recent Labs    08/22/21 0948  PROBNP 113   HbA1C: Recent Labs    08/15/22 1036  HGBA1C 10.0*   CBG: Recent Labs  Lab 08/15/22 2257 08/16/22 0808 08/16/22 1152 08/16/22 1703 08/16/22 2142  GLUCAP 224* 236* 257* 202* 185*   Lipid Profile: No results for input(s): "CHOL", "HDL", "LDLCALC", "TRIG", "CHOLHDL", "LDLDIRECT" in the last 72 hours. Thyroid Function Tests: No results for input(s): "TSH", "T4TOTAL", "FREET4", "T3FREE", "THYROIDAB" in the last 72 hours. Anemia Panel: No results for input(s): "VITAMINB12", "FOLATE", "FERRITIN", "TIBC", "IRON", "RETICCTPCT" in the last 72 hours. Sepsis Labs: Recent Labs  Lab 08/14/22 2259 08/15/22 0039  LATICACIDVEN 2.0* 1.8    Recent Results (from the past 240 hour(s))  SARS Coronavirus 2 by RT PCR (hospital order, performed in [facility] hospital lab) *cepheid single result test* Anterior Nasal Swab     Status: None   Collection Time: 08/14/22 11:00 PM   Specimen: Anterior Nasal Swab  Result Value Ref Range Status   SARS Coronavirus 2 by RT PCR NEGATIVE NEGATIVE Final    Comment: (NOTE) SARS-CoV-2 target nucleic acids are NOT DETECTED.  The SARS-CoV-2 RNA is generally detectable in upper and lower respiratory specimens during the acute phase of infection. The lowest concentration of SARS-CoV-2 viral copies this assay can detect is 250 copies / mL. A negative result does not preclude SARS-CoV-2 infection and should not be used as the sole basis for treatment or other patient management decisions.  A negative result may occur with improper specimen collection / handling, submission of specimen other than  nasopharyngeal swab, presence of viral mutation(s) within the areas targeted by this assay, and inadequate number of viral copies (<250 copies / mL). A negative result must be combined with clinical observations, patient history, and epidemiological information.  Fact Sheet for Patients:   RoadLapTop.co.za  Fact Sheet for Healthcare Providers: http://kim-miller.com/  This test is not yet approved or  cleared by the [place] FDA and has been authorized for detection and/or diagnosis of SARS-CoV-2 by FDA under an Emergency Use Authorization (EUA).  This EUA will remain in effect (meaning this test can be used) for the duration of the COVID-19 declaration under Section 564(b)(1) of the Act, 21 U.S.C. section 360bbb-3(b)(1), unless the authorization is terminated or revoked sooner.  Performed at [facility], [address] [place], [place] [address]          Radiology Studies: ECHOCARDIOGRAM COMPLETE  Result Date: 08/15/2022    ECHOCARDIOGRAM REPORT   Patient Name:   KARINGTON BIRDSONG Date of Exam: 08/15/2022 Medical Rec #:  [id number]  Height:       67.0 in Accession #:    [id number]        Weight:       420.0 lb Date of Birth:  11-04-1981         BSA:          2.772 m Patient Age:    40 years          BP:           156/73 mmHg Patient Gender: F                 HR:           81 bpm. Exam Location:  Inpatient Procedure: 2D Echo Indications:    acute systolic heart failure  History:        Patient has prior history of Echocardiogram examinations, most                 recent 07/12/2021. CAD; Risk Factors:Diabetes, Dyslipidemia and                 Hypertension.  Sonographer:    Delcie Roch RDCS Referring Phys: [id number] JENNIFER YATES  Sonographer Comments: Technically difficult study due to poor echo windows and patient is obese. Image acquisition challenging due to patient body habitus. IMPRESSIONS  1. Technically difficult  study, poor echo windows. Definity contrast given  2. Left ventricular ejection fraction, by estimation, is 65 to 70%. Left ventricular ejection fraction by PLAX is 65 %. The left ventricle has normal function. The left ventricle has no regional wall motion abnormalities. There is mild left ventricular hypertrophy. Left ventricular diastolic parameters are indeterminate.  3. Right ventricular systolic function is normal. The right ventricular size is normal.  4. The mitral valve is abnormal. Trivial mitral valve regurgitation.  5. The aortic valve was not well visualized. Aortic valve regurgitation is not visualized.  6. The inferior vena cava is normal in size with greater than 50% respiratory variability, suggesting right atrial pressure of 3 mmHg. Comparison(s): No significant change from prior study. 07/12/2021: LVEF 65-70%, mild LVH. FINDINGS  Left Ventricle: Left ventricular ejection fraction, by estimation, is 65 to 70%. Left ventricular ejection fraction by PLAX is 65 %. The left ventricle has normal function. The left ventricle has no regional wall motion abnormalities. Definity contrast agent was given IV to delineate the left ventricular endocardial borders. The left ventricular internal cavity size was normal in size. There is mild left ventricular hypertrophy. Left ventricular diastolic parameters are indeterminate. Right Ventricle: The right ventricular size is normal. No increase in right ventricular wall thickness. Right ventricular systolic function is normal. Left Atrium: Left atrial size was normal in size. Right Atrium: Right atrial size was normal in size. Pericardium: There is no evidence of pericardial effusion. Mitral Valve: The mitral valve is abnormal. Mild mitral annular calcification. Trivial mitral valve regurgitation. Tricuspid Valve: The tricuspid valve is not well visualized. Tricuspid valve regurgitation is not demonstrated. Aortic Valve: The aortic valve was not well visualized.  Aortic valve regurgitation is not visualized. Pulmonic Valve: The pulmonic valve was not well visualized. Pulmonic valve regurgitation is not visualized. Aorta: The aortic root and ascending aorta are structurally normal, with no evidence of dilitation. Venous: The inferior vena cava is normal in size with greater than 50% respiratory variability, suggesting right atrial pressure of 3 mmHg. IAS/Shunts: No atrial level shunt detected by color flow Doppler.  LEFT VENTRICLE PLAX 2D LV EF:  Left            Diastology                ventricular     LV e' medial:    11.40 cm/s                ejection        LV E/e' medial:  8.1                fraction by     LV e' lateral:   9.79 cm/s                PLAX is 65      LV E/e' lateral: 9.4                %. LVIDd:         4.80 cm LVIDs:         3.10 cm LV PW:         1.20 cm LV IVS:        1.20 cm LVOT diam:     1.90 cm LV SV:         58 LV SV Index:   21 LVOT Area:     2.84 cm  RIGHT VENTRICLE             IVC RV S prime:     15.30 cm/s  IVC diam: 2.00 cm TAPSE (M-mode): 2.0 cm LEFT ATRIUM           Index        RIGHT ATRIUM           Index LA diam:      4.30 cm 1.55 cm/m   RA Area:     11.90 cm LA Vol (A4C): 57.2 ml 20.64 ml/m  RA Volume:   25.60 ml  9.24 ml/m  AORTIC VALVE LVOT Vmax:   116.00 cm/s LVOT Vmean:  73.500 cm/s LVOT VTI:    0.203 m  AORTA Ao Root diam: 2.80 cm Ao Asc diam:  3.30 cm MITRAL VALVE MV Area (PHT): 3.77 cm    SHUNTS MV Decel Time: 201 msec    Systemic VTI:  0.20 m MV E velocity: 92.40 cm/s  Systemic Diam: 1.90 cm MV A velocity: 76.50 cm/s MV E/A ratio:  1.21 Zoila Shutter MD Electronically signed by Zoila Shutter MD Signature Date/Time: 08/15/2022/4:57:21 PM    Final         Scheduled Meds:  aspirin  81 mg Oral Daily   atorvastatin  20 mg Oral Daily   budesonide (PULMICORT) nebulizer solution  0.25 mg Nebulization BID   DULoxetine  30 mg Oral Daily   enoxaparin (LOVENOX) injection  100 mg Subcutaneous Q24H   furosemide  80 mg  Intravenous BID   gabapentin  600 mg Oral TID   hydrALAZINE  100 mg Oral TID   insulin aspart  0-20 Units Subcutaneous TID WC   insulin aspart  0-5 Units Subcutaneous QHS   insulin regular human CONCENTRATED  75 Units Subcutaneous Q lunch   insulin regular human CONCENTRATED  75 Units Subcutaneous Q supper   insulin regular human CONCENTRATED  80 Units Subcutaneous Q breakfast   isosorbide mononitrate  60 mg Oral Daily   melatonin  10 mg Oral QHS   metoprolol tartrate  50 mg Oral BID   sodium chloride flush  3 mL Intravenous Q12H   Continuous Infusions:   LOS: 2 days   Time  spent= 35 mins    Raye Wiens Joline Maxcy, MD [facility]  If 7PM-7AM, please contact night-coverage  08/17/2022, 7:58 AM

## 2022-08-17 NOTE — Progress Notes (Signed)
Heart Failure Nurse Navigator Progress Note  Attempted to interview for HV TOC readiness. Pt currently sleeping with HOB elevated to 45.   Will attempt to update SDOH information at a later time.   Ozella Rocks, MSN, RN

## 2022-08-17 NOTE — Evaluation (Signed)
Occupational Therapy Evaluation Patient Details Name: Lindsay [name] MRN: [id number] DOB: 06/25/1981 Today's Date: 08/17/2022   History of Present Illness 41 y.o. female presents to [facility] hospital on 08/15/2022 with chest pain and SOB. Pt admitted for observation with concern for possible CHF. PMH includes acute renal failure, asthma, CAD, DM, HLD, HTN.   Clinical Impression   Pt admitted with the above diagnosis and has the deficits listed below. Pt would benefit from cont OT to increase independence with basic adls and adl transfers so pt can d/c home alone. Pt has a 14 year daughter at home that returned to [employer] so pt does not have assist during the day. Pt provided with a toilet aid to give independence to clean self after bowel movements. Pt also stated she struggles at times getting of a low commode at home. Rec wide 3:1 commode to to body habitus. Will continue to see acutely to follow up with toileting tasks.         Recommendations for follow up therapy are one component of a multi-disciplinary discharge planning process, led by the attending physician.  Recommendations may be updated based on patient status, additional functional criteria and insurance authorization.   Follow Up Recommendations  No OT follow up    Assistance Recommended at Discharge Intermittent Supervision/Assistance  Patient can return home with the following A little help with bathing/dressing/bathroom;Assist for transportation;Help with stairs or ramp for entrance    Functional Status Assessment  Patient has had a recent decline in their functional status and demonstrates the ability to make significant improvements in function in a reasonable and predictable amount of time.  Equipment Recommendations  BSC/3in1    Recommendations for Other Services       Precautions / Restrictions Precautions Precautions: Other (comment) Precaution Comments: monitor sats and DOE Restrictions Weight Bearing  Restrictions: No      Mobility Bed Mobility Overal bed mobility: Modified Independent             General bed mobility comments: HOB elevated, increased time    Transfers Overall transfer level: Modified independent Equipment used: Rolling walker (2 wheels)               General transfer comment: no physical assist needed. Supervision given due to lines.      Balance Overall balance assessment: Needs assistance Sitting-balance support: No upper extremity supported, Feet supported Sitting balance-Leahy Scale: Good     Standing balance support: Single extremity supported, Reliant on assistive device for balance Standing balance-Leahy Scale: Poor Standing balance comment: Pt requires use of walker at this time.                           ADL either performed or assessed with clinical judgement   ADL Overall ADL's : Needs assistance/impaired Eating/Feeding: Independent   Grooming: Wash/dry hands;Wash/dry face;Oral care;Applying deodorant;Brushing hair;Supervision/safety;Standing Grooming Details (indicate cue type and reason): Pt stood at sink for appx 5 minutes Upper Body Bathing: Set up;Sitting   Lower Body Bathing: Minimal assistance;Sit to/from stand;Cueing for compensatory techniques Lower Body Bathing Details (indicate cue type and reason): Pt bathes in tub shower at home occasionally sitting on chair. Upper Body Dressing : Set up;Sitting   Lower Body Dressing: Minimal assistance;Sit to/from stand;Cueing for compensatory techniques Lower Body Dressing Details (indicate cue type and reason): Pt states she props legs on bed at home to donn socks and shoes and it works better than here.  Daughter assists  at times. Toilet Transfer: Supervision/safety;Ambulation;Rolling walker (2 wheels);Grab bars;Comfort height toilet Toilet Transfer Details (indicate cue type and reason): Pt states toilet at home is very low. Toileting- Clothing Manipulation and  Hygiene: Minimal assistance;Sit to/from stand;Cueing for compensatory techniques Toileting - Clothing Manipulation Details (indicate cue type and reason): Pt has difficulty cleaning self due to pain in R shoulder. Pt provided with toilet aid to use at home to assist with peri care     Functional mobility during ADLs: Min guard;Rolling walker (2 wheels) General ADL Comments: Pt doing well with adls. Most limited with peri care at home and AE provided to assist with this.     Vision Baseline Vision/History: 0 No visual deficits Ability to See in Adequate Light: 0 Adequate Patient Visual Report: No change from baseline Vision Assessment?: No apparent visual deficits     Perception     Praxis      Pertinent Vitals/Pain Pain Assessment Pain Assessment: No/denies pain Pain Score: 0-No pain     Hand Dominance Right   Extremity/Trunk Assessment Upper Extremity Assessment Upper Extremity Assessment: RUE deficits/detail RUE Deficits / Details: pain in R arm ever since athroscopic surgery in 2016 but ROM intact. RUE: Shoulder pain with ROM RUE Sensation: WNL RUE Coordination: WNL   Lower Extremity Assessment Lower Extremity Assessment: Defer to PT evaluation   Cervical / Trunk Assessment Cervical / Trunk Assessment: Other exceptions Cervical / Trunk Exceptions: morbid obesity   Communication Communication Communication: No difficulties   Cognition Arousal/Alertness: Awake/alert Behavior During Therapy: WFL for tasks assessed/performed Overall Cognitive Status: Within Functional Limits for tasks assessed                                       General Comments  Pt most limited with toileting tasks including cleaning self (provided with AE) and states toilet is too low for her at home.    Exercises     Shoulder Instructions      Home Living Family/patient expects to be discharged to:: Private residence Living Arrangements: Children Available Help at  Discharge: Family;Available PRN/intermittently Type of Home: Apartment Home Access: Stairs to enter Entrance Stairs-Number of Steps: 2 Entrance Stairs-Rails: None Home Layout: One level     Bathroom Shower/Tub: Tub/shower unit;Curtain   Bathroom Toilet: Standard Bathroom Accessibility: No   Home Equipment: Agricultural consultant (2 wheels);Cane - quad;Shower seat          Prior Functioning/Environment Prior Level of Function : Independent/Modified Independent             Mobility Comments: ambulates within the home with PRN use of quad cane, utilizes rollator in the community. ADLs Comments: daugher assists with peri care. pt provided with toilet aid as daughter back at school and not available all the time.        OT Problem List: Decreased strength;Impaired balance (sitting and/or standing);Decreased knowledge of use of DME or AE      OT Treatment/Interventions: Self-care/ADL training;Therapeutic activities;DME and/or AE instruction    OT Goals(Current goals can be found in the care plan section) Acute Rehab OT Goals Patient Stated Goal: to go home and feel better OT Goal Formulation: With patient Time For Goal Achievement: 08/31/22 Potential to Achieve Goals: Good ADL Goals Pt Will Perform Lower Body Dressing: with modified independence;sit to/from stand Pt Will Perform Tub/Shower Transfer: with supervision;rolling walker Additional ADL Goal #1: Pt will walk to bathroom with walker  and complete all toileting with toilet aid and modified independent.  OT Frequency: Min 2X/week    Co-evaluation              AM-PAC OT "6 Clicks" Daily Activity     Outcome Measure Help from another person eating meals?: None Help from another person taking care of personal grooming?: None Help from another person toileting, which includes using toliet, bedpan, or urinal?: A Little Help from another person bathing (including washing, rinsing, drying)?: A Little Help from another  person to put on and taking off regular upper body clothing?: None Help from another person to put on and taking off regular lower body clothing?: A Little 6 Click Score: 21   End of Session Equipment Utilized During Treatment: Rolling walker (2 wheels) Nurse Communication: Mobility status  Activity Tolerance: Patient tolerated treatment well Patient left: in bed;with call bell/phone within reach  OT Visit Diagnosis: Unsteadiness on feet (R26.81)                Time: 3016-0109 OT Time Calculation (min): 34 min Charges:  OT General Charges $OT Visit: 1 Visit OT Evaluation $OT Eval Moderate Complexity: 1 Mod OT Treatments $Self Care/Home Management : 8-22 mins  Hope Budds 08/17/2022, 11:06 AM

## 2022-08-17 NOTE — TOC Progression Note (Signed)
Transition of Care [facility]) - Progression Note    Patient Details  Name: Lindsay [name] MRN: [id number] Date of Birth: June 08, 1981  Transition of Care [facility] CM/SW Contact  Leone Haven, RN Phone Number: 08/17/2022, 5:23 PM  Clinical Narrative:    from home alone, SOB, cpap at night, conts on iv lasix, indep, TOC following.  Per pt/ot eval needs BSC and Barriatric rollator.  TOC Following.       Expected Discharge Plan and Services                                                 Social Determinants of Health (SDOH) Interventions    Readmission Risk Interventions     No data to display

## 2022-08-17 NOTE — Inpatient Diabetes Management (Signed)
Inpatient Diabetes Program Recommendations  AACE/ADA: New Consensus Statement on Inpatient Glycemic Control (2015)  Target Ranges:  Prepandial:   less than 140 mg/dL      Peak postprandial:   less than 180 mg/dL (1-2 hours)      Critically ill patients:  140 - 180 mg/dL   Lab Results  Component Value Date   GLUCAP 130 (H) 08/17/2022   HGBA1C 10.0 (H) 08/15/2022    Review of Glycemic Control  Latest Reference Range & Units 08/16/22 08:08 08/16/22 11:52 08/16/22 17:03 08/16/22 21:42 08/17/22 07:38 08/17/22 08:40  Glucose-Capillary 70 - 99 mg/dL 771 (H) 165 (H) 790 (H) 185 (H) 65 (L) 130 (H)   Diabetes history: DM 2 Outpatient Diabetes medications: Concentrated Humulin U-500 insulin 80 units breakfast,  75 units lunch, 75 units supper, Victoza 1.8 mg Daily Current orders for Inpatient glycemic control:  Concentrated Humulin U-500 insulin 80 units breakfast,  75 units lunch, 75 units supper Novolog 0-20 units tid + hs  A1c 10% on 8/26, will see pt this admission  MD note hypoglycemia 65 this am  Inpatient Diabetes Program Recommendations:    - reduce Humulin U-500 pm dose to 70 units  Thanks, Christena Deem RN, MSN, BC-ADM Inpatient Diabetes Coordinator Team Pager [phone] (8a-5p)

## 2022-08-18 DIAGNOSIS — R0602 Shortness of breath: Secondary | ICD-10-CM | POA: Diagnosis not present

## 2022-08-18 LAB — BASIC METABOLIC PANEL
Anion gap: 8 (ref 5–15)
BUN: 25 mg/dL — ABNORMAL HIGH (ref 6–20)
CO2: 26 mmol/L (ref 22–32)
Calcium: 9.1 mg/dL (ref 8.9–10.3)
Chloride: 105 mmol/L (ref 98–111)
Creatinine, Ser: 1.19 mg/dL — ABNORMAL HIGH (ref 0.44–1.00)
GFR, Estimated: 59 mL/min — ABNORMAL LOW (ref >60)
Glucose, Bld: 91 mg/dL (ref 70–99)
Potassium: 4 mmol/L (ref 3.5–5.1)
Sodium: 139 mmol/L (ref 135–145)

## 2022-08-18 LAB — CBC
HCT: 30.8 % — ABNORMAL LOW (ref 36.0–46.0)
Hemoglobin: 9.8 g/dL — ABNORMAL LOW (ref 12.0–15.0)
MCH: 27.1 pg (ref 26.0–34.0)
MCHC: 31.8 g/dL (ref 30.0–36.0)
MCV: 85.3 fL (ref 80.0–100.0)
Platelets: 259 10*3/uL (ref 150–400)
RBC: 3.61 MIL/uL — ABNORMAL LOW (ref 3.87–5.11)
RDW: 14.9 % (ref 11.5–15.5)
WBC: 7.2 10*3/uL (ref 4.0–10.5)
nRBC: 0 % (ref 0.0–0.2)

## 2022-08-18 LAB — GLUCOSE, CAPILLARY
Glucose-Capillary: 96 mg/dL (ref 70–99)
Glucose-Capillary: 198 mg/dL — ABNORMAL HIGH (ref 70–99)
Glucose-Capillary: 206 mg/dL — ABNORMAL HIGH (ref 70–99)
Glucose-Capillary: 145 mg/dL — ABNORMAL HIGH (ref 70–99)
Glucose-Capillary: 188 mg/dL — ABNORMAL HIGH (ref 70–99)

## 2022-08-18 LAB — LIPOPROTEIN A (LPA): Lipoprotein (a): 225.3 nmol/L — ABNORMAL HIGH (ref <75.0)

## 2022-08-18 LAB — MAGNESIUM: Magnesium: 1.9 mg/dL (ref 1.7–2.4)

## 2022-08-18 MED ORDER — IBUPROFEN 600 MG PO TABS
600.0000 mg | ORAL_TABLET | Freq: Once | ORAL | Status: AC
Start: 2022-08-18 — End: 2022-08-18
  Administered 2022-08-18: 600 mg via ORAL
  Filled 2022-08-18: qty 1

## 2022-08-18 NOTE — Progress Notes (Signed)
Heart Failure Navigator Progress Note  Attempted to assess for Heart & Vascular TOC clinic readiness and updating of SDOH. Pt asleep today, not ready to talk. Will attempt to interview patient at a later time.   Ozella Rocks, MSN, RN

## 2022-08-18 NOTE — Progress Notes (Signed)
Pt able to put on CPAP without help. Will call if help is needed.

## 2022-08-18 NOTE — Progress Notes (Signed)
PROGRESS NOTE    Lindsay [name]  ZOX:[id number] DOB: 1981-03-29 DOA: 08/14/2022 PCP: Grayce Sessions, NP   Brief Narrative:  41 year old female with a history of CAD s/p stent placement, diabetes mellitus type 2, hypertension, hyperlipidemia, HFpEF, morbid obesity presented with shortness of breath on exertion.  Patient says that she takes Lasix 40 mg twice a day at home, no recent medication changes.  Upon admission patient was started on diuretics, cardiology team was consulted, echo showed EF of 70%.  Initially patient was also febrile but no obvious source was identified.   Assessment & Plan:  Principal Problem:   Shortness of breath Active Problems:   Essential hypertension   AKI (acute kidney injury) (HCC)   Morbid obesity (HCC)   Coronary artery disease involving native coronary artery of native heart with angina pectoris (HCC)   Hyperlipidemia   Uncontrolled type 2 diabetes mellitus with hyperglycemia, with long-term current use of insulin (HCC)   Back pain   Mood disorder (HCC)    Acute diastolic CHF exacerbation, class III.  EF 70%. - Seen by cards, diuresing well with Lasix 80 mg IV twice daily.  Will transition to p.o. torsemide in next 24 hours or so..  Echocardiogram showed EF 70%.  Cardiology team following.   Acute kidney injury -Baseline creatinine 1.1.  Cr 1.38 > 1.24 >1.19  Fever - No obvious source.  Blood cultures not drawn upon admission but can hold off as she has remained afebrile.  UA negative. ProCal neg.    Hypertension -Continue hydralazine, metoprolol.  As needed hold   Hyperlipidemia -Continue Lipitor   Diabetes mellitus type 2, insulin-dependent.  Uncontrolled due to hyperglycemia -Hemoglobin A1c is 10.  Better controlled in last 24 hours. -Sliding scale and Accu-Chek.  Getting U-500    CAD s/p stent placement -Continue aspirin, Imdur   Chronic back pain -Continue baclofen, Neurontin, Robaxin   Mood disorder -Continue Cymbalta,  melatonin   Morbid obesity -BMI 65.78 kg/m -Should have evaluation as outpatient for bariatric medicine/surgery      DVT prophylaxis: Lovenox Code Status: Full Family Communication:    Status is: Inpatient Remains inpatient appropriate because: On IV diuretics  Subjective: Seen and examined at bedside, feels little better after getting diuresis over last 24 hours.  Still has some exertional dyspnea, and orthopnea.  Some hypoxia when sleeping as well when she drops down to 85% on room air.  Examination: Constitutional: Not in acute distress Respiratory: Clear to auscultation bilaterally Cardiovascular: Normal sinus rhythm, no rubs Abdomen: Nontender nondistended good bowel sounds Musculoskeletal: 1+ bilateral lower extremity pitting edema Skin: No rashes seen Neurologic: CN 2-12 grossly intact.  And nonfocal Psychiatric: Normal judgment and insight. Alert and oriented x 3. Normal mood.  Objective: Vitals:   08/17/22 1944 08/17/22 2313 08/18/22 0500 08/18/22 0735  BP: (!) 128/52 (!) 154/76    Pulse: 87 85  80  Resp: 20 13  16   Temp: 98.1 F (36.7 C)     TempSrc: Oral     SpO2: 97% 94%  100%  Weight:   (!) 200.3 kg   Height:        Intake/Output Summary (Last 24 hours) at 08/18/2022 0736 Last data filed at 08/17/2022 2300 Gross per 24 hour  Intake 840 ml  Output 2400 ml  Net -1560 ml   Filed Weights   08/15/22 2222 08/16/22 0430 08/18/22 0500  Weight: (!) 201.9 kg (!) 201.1 kg (!) 200.3 kg     Data Reviewed:  CBC: Recent Labs  Lab 08/14/22 2259 08/16/22 0343 08/18/22 0406  WBC 7.8 8.6 7.2  HGB 10.5* 10.4* 9.8*  HCT 33.8* 32.9* 30.8*  MCV 86.0 85.7 85.3  PLT 227 278 259   Basic Metabolic Panel: Recent Labs  Lab 08/14/22 2259 08/16/22 0343 08/17/22 0708 08/18/22 0406  NA 142 138 139 139  K 4.1 4.6 4.1 4.0  CL 104 103 105 105  CO2 29 23 26 26   GLUCOSE 101* 279* 63* 91  BUN 22* 18 21* 25*  CREATININE 1.16* 1.38* 1.24* 1.19*  CALCIUM 9.4 9.1  9.0 9.1  MG  --   --   --  1.9   GFR: Estimated Creatinine Clearance: 116.9 mL/min (A) (by C-G formula based on SCr of 1.19 mg/dL (H)). Liver Function Tests: No results for input(s): "AST", "ALT", "ALKPHOS", "BILITOT", "PROT", "ALBUMIN" in the last 168 hours. No results for input(s): "LIPASE", "AMYLASE" in the last 168 hours. No results for input(s): "AMMONIA" in the last 168 hours. Coagulation Profile: No results for input(s): "INR", "PROTIME" in the last 168 hours. Cardiac Enzymes: No results for input(s): "CKTOTAL", "CKMB", "CKMBINDEX", "TROPONINI" in the last 168 hours. BNP (last 3 results) Recent Labs    08/22/21 0948  PROBNP 113   HbA1C: Recent Labs    08/15/22 1036  HGBA1C 10.0*   CBG: Recent Labs  Lab 08/17/22 0840 08/17/22 1055 08/17/22 1536 08/17/22 2030 08/18/22 0542  GLUCAP 130* 138* 205* 191* 96   Lipid Profile: No results for input(s): "CHOL", "HDL", "LDLCALC", "TRIG", "CHOLHDL", "LDLDIRECT" in the last 72 hours. Thyroid Function Tests: No results for input(s): "TSH", "T4TOTAL", "FREET4", "T3FREE", "THYROIDAB" in the last 72 hours. Anemia Panel: No results for input(s): "VITAMINB12", "FOLATE", "FERRITIN", "TIBC", "IRON", "RETICCTPCT" in the last 72 hours. Sepsis Labs: Recent Labs  Lab 08/14/22 2259 08/15/22 0039 08/17/22 0708  PROCALCITON  --   --  <0.10  LATICACIDVEN 2.0* 1.8  --     Recent Results (from the past 240 hour(s))  SARS Coronavirus 2 by RT PCR (hospital order, performed in [facility] hospital lab) *cepheid single result test* Anterior Nasal Swab     Status: None   Collection Time: 08/14/22 11:00 PM   Specimen: Anterior Nasal Swab  Result Value Ref Range Status   SARS Coronavirus 2 by RT PCR NEGATIVE NEGATIVE Final    Comment: (NOTE) SARS-CoV-2 target nucleic acids are NOT DETECTED.  The SARS-CoV-2 RNA is generally detectable in upper and lower respiratory specimens during the acute phase of infection. The lowest concentration  of SARS-CoV-2 viral copies this assay can detect is 250 copies / mL. A negative result does not preclude SARS-CoV-2 infection and should not be used as the sole basis for treatment or other patient management decisions.  A negative result may occur with improper specimen collection / handling, submission of specimen other than nasopharyngeal swab, presence of viral mutation(s) within the areas targeted by this assay, and inadequate number of viral copies (<250 copies / mL). A negative result must be combined with clinical observations, patient history, and epidemiological information.  Fact Sheet for Patients:   RoadLapTop.co.za  Fact Sheet for Healthcare Providers: http://kim-miller.com/  This test is not yet approved or  cleared by the [place] FDA and has been authorized for detection and/or diagnosis of SARS-CoV-2 by FDA under an Emergency Use Authorization (EUA).  This EUA will remain in effect (meaning this test can be used) for the duration of the COVID-19 declaration under Section 564(b)(1) of the Act,  21 U.S.C. section 360bbb-3(b)(1), unless the authorization is terminated or revoked sooner.  Performed at [facility], [address] [place], [place] [address]          Radiology Studies: No results found.      Scheduled Meds:  aspirin  81 mg Oral Daily   atorvastatin  20 mg Oral Daily   budesonide (PULMICORT) nebulizer solution  0.25 mg Nebulization BID   DULoxetine  30 mg Oral Daily   enoxaparin (LOVENOX) injection  100 mg Subcutaneous Q24H   furosemide  80 mg Intravenous BID   gabapentin  600 mg Oral TID   hydrALAZINE  100 mg Oral TID   insulin aspart  0-20 Units Subcutaneous TID WC   insulin aspart  0-5 Units Subcutaneous QHS   insulin regular human CONCENTRATED  70 Units Subcutaneous Q supper   insulin regular human CONCENTRATED  75 Units Subcutaneous Q lunch   insulin regular human CONCENTRATED   80 Units Subcutaneous Q breakfast   isosorbide mononitrate  60 mg Oral Daily   melatonin  10 mg Oral QHS   metoprolol tartrate  50 mg Oral BID   sodium chloride flush  3 mL Intravenous Q12H   Continuous Infusions:   LOS: 3 days   Time spent= 35 mins    Charlita Brian Joline Maxcy, MD [facility]  If 7PM-7AM, please contact night-coverage  08/18/2022, 7:36 AM

## 2022-08-18 NOTE — Progress Notes (Signed)
Physical Therapy Treatment Patient Details Name: Lindsay [name] MRN: [id number] DOB: 10-22-1981 Today's Date: 08/18/2022   History of Present Illness 41 y.o. female presents to [facility] hospital on 08/15/2022 with chest pain and SOB. Pt admitted for observation with concern for possible CHF. PMH includes acute renal failure, asthma, CAD, DM, HLD, HTN.    PT Comments    Pt tolerating increased activity for longer before needing seated rest break. She is able to mobilize further with use of RW for energy conservation. Will not likely be agreeable to AD but if so recommend bariatric rollator for energy conservation. Worked on standing exercise as well as balance activities. HR increased to 100 bpm with mobility, SPO2 remained in 90's on RA. PT will continue to follow.    Recommendations for follow up therapy are one component of a multi-disciplinary discharge planning process, led by the attending physician.  Recommendations may be updated based on patient status, additional functional criteria and insurance authorization.  Follow Up Recommendations  No PT follow up     Assistance Recommended at Discharge PRN  Patient can return home with the following A little help with bathing/dressing/bathroom;Help with stairs or ramp for entrance   Equipment Recommendations  Other (comment) (bariatric rollator)    Recommendations for Other Services       Precautions / Restrictions Precautions Precautions: Other (comment) Precaution Comments: monitor sats and DOE Restrictions Weight Bearing Restrictions: No     Mobility  Bed Mobility Overal bed mobility: Modified Independent             General bed mobility comments: HOB elevated, increased time to get back into bed    Transfers Overall transfer level: Modified independent Equipment used: Rolling walker (2 wheels), None               General transfer comment: stood from toilet with no AD, stood from bed to RW. No LOB with or  without RW    Ambulation/Gait Ambulation/Gait assistance: Supervision Gait Distance (Feet): 100 Feet Assistive device: Rolling walker (2 wheels), None Gait Pattern/deviations: Step-through pattern, Wide base of support Gait velocity: functional for household Gait velocity interpretation: 1.31 - 2.62 ft/sec, indicative of limited community ambulator   General Gait Details: wide base due to body habitus. Ambulated around room without AD without LOB and close supervision. DOE 3/4 after 15' and needed to sit. RW used for longer ambulation (and pt had ambulated even further with mobility team earlier in day) and was able to tolerate further distance before DOE reached 3/4   Stairs             Wheelchair Mobility    Modified Rankin (Stroke Patients Only)       Balance Overall balance assessment: Needs assistance Sitting-balance support: No upper extremity supported, Feet supported Sitting balance-Leahy Scale: Good     Standing balance support: Single extremity supported, Reliant on assistive device for balance Standing balance-Leahy Scale: Fair Standing balance comment: pt able to maintain static stance without UE support               High Level Balance Comments: worked on SL stance, needed min HHA to maintain 30 secs each side            Cognition Arousal/Alertness: Awake/alert Behavior During Therapy: WFL for tasks assessed/performed Overall Cognitive Status: Within Functional Limits for tasks assessed  Exercises General Exercises - Lower Extremity Mini-Sqauts: 10 reps, Standing    General Comments General comments (skin integrity, edema, etc.): SPO2 to low 90's with ambulation, upper 90's at rest. HR elevated to 110 bpm with ambulation.      Pertinent Vitals/Pain Pain Assessment Pain Assessment: Faces Faces Pain Scale: Hurts little more Pain Location: back after reaching forward to clean  self Pain Descriptors / Indicators: Aching Pain Intervention(s): Monitored during session, Limited activity within patient's tolerance    Home Living                          Prior Function            PT Goals (current goals can now be found in the care plan section) Acute Rehab PT Goals Patient Stated Goal: to improve activity tolerance PT Goal Formulation: With patient Time For Goal Achievement: 08/30/22 Potential to Achieve Goals: Fair Progress towards PT goals: Progressing toward goals    Frequency    Min 2X/week      PT Plan Current plan remains appropriate    [name]-evaluation              AM-PAC PT "6 Clicks" Mobility   Outcome Measure  Help needed turning from your back to your side while in a flat bed without using bedrails?: None Help needed moving from lying on your back to sitting on the side of a flat bed without using bedrails?: None Help needed moving to and from a bed to a chair (including a wheelchair)?: None Help needed standing up from a chair using your arms (e.g., wheelchair or bedside chair)?: None Help needed to walk in hospital room?: A Little Help needed climbing 3-5 steps with a railing? : A Little 6 Click Score: 22    End of Session   Activity Tolerance: Patient tolerated treatment well Patient left: in bed;with call bell/phone within reach Nurse Communication: Mobility status PT Visit Diagnosis: Other abnormalities of gait and mobility (R26.89)     Time: 0865-7846 PT Time Calculation (min) (ACUTE ONLY): 31 min  Charges:  $Gait Training: 8-22 mins $Therapeutic Exercise: 8-22 mins                     Lindsay [name], PT  Acute Rehab Services Secure chat preferred Office [phone]    Lindsay [name] Lindsay [name] 08/18/2022, 4:49 PM

## 2022-08-18 NOTE — Progress Notes (Signed)
   08/18/22 1057  Mobility  Activity Ambulated with assistance in hallway  Level of Assistance Standby assist, set-up cues, supervision of patient - no hands on  Assistive Device Front wheel walker  Distance Ambulated (ft) 214 ft  Activity Response Tolerated well  $Mobility charge 1 Mobility   Mobility Specialist Progress Note  Pre-Mobility: 136/67 BP, 98% SpO2 During Mobility: 93% SpO2  Received pt in bed having no complaints and agreeable to mobility. Pt was asymptomatic throughout ambulation and returned to room w/o fault. Left in bed w/ call bell in reach and all needs met.   Lucious Groves Mobility Specialist

## 2022-08-19 DIAGNOSIS — E1165 Type 2 diabetes mellitus with hyperglycemia: Secondary | ICD-10-CM | POA: Diagnosis not present

## 2022-08-19 DIAGNOSIS — Z794 Long term (current) use of insulin: Secondary | ICD-10-CM | POA: Diagnosis not present

## 2022-08-19 DIAGNOSIS — M5441 Lumbago with sciatica, right side: Secondary | ICD-10-CM

## 2022-08-19 DIAGNOSIS — R0602 Shortness of breath: Secondary | ICD-10-CM | POA: Diagnosis not present

## 2022-08-19 DIAGNOSIS — I25119 Atherosclerotic heart disease of native coronary artery with unspecified angina pectoris: Secondary | ICD-10-CM | POA: Diagnosis not present

## 2022-08-19 DIAGNOSIS — E7800 Pure hypercholesterolemia, unspecified: Secondary | ICD-10-CM

## 2022-08-19 DIAGNOSIS — N179 Acute kidney failure, unspecified: Secondary | ICD-10-CM | POA: Diagnosis not present

## 2022-08-19 DIAGNOSIS — I10 Essential (primary) hypertension: Secondary | ICD-10-CM | POA: Diagnosis not present

## 2022-08-19 LAB — PHOSPHORUS: Phosphorus: 4.6 mg/dL (ref 2.5–4.6)

## 2022-08-19 LAB — GLUCOSE, CAPILLARY
Glucose-Capillary: 114 mg/dL — ABNORMAL HIGH (ref 70–99)
Glucose-Capillary: 197 mg/dL — ABNORMAL HIGH (ref 70–99)
Glucose-Capillary: 225 mg/dL — ABNORMAL HIGH (ref 70–99)
Glucose-Capillary: 140 mg/dL — ABNORMAL HIGH (ref 70–99)

## 2022-08-19 LAB — COMPREHENSIVE METABOLIC PANEL
ALT: 45 U/L — ABNORMAL HIGH (ref 0–44)
AST: 67 U/L — ABNORMAL HIGH (ref 15–41)
Albumin: 3.3 g/dL — ABNORMAL LOW (ref 3.5–5.0)
Alkaline Phosphatase: 66 U/L (ref 38–126)
Anion gap: 12 (ref 5–15)
BUN: 25 mg/dL — ABNORMAL HIGH (ref 6–20)
CO2: 27 mmol/L (ref 22–32)
Calcium: 9.3 mg/dL (ref 8.9–10.3)
Chloride: 101 mmol/L (ref 98–111)
Creatinine, Ser: 1.17 mg/dL — ABNORMAL HIGH (ref 0.44–1.00)
GFR, Estimated: >60 mL/min (ref >60)
Glucose, Bld: 131 mg/dL — ABNORMAL HIGH (ref 70–99)
Potassium: 4.1 mmol/L (ref 3.5–5.1)
Sodium: 140 mmol/L (ref 135–145)
Total Bilirubin: 0.6 mg/dL (ref 0.3–1.2)
Total Protein: 6.9 g/dL (ref 6.5–8.1)

## 2022-08-19 LAB — CBC WITH DIFFERENTIAL/PLATELET
Abs Immature Granulocytes: 0.02 10*3/uL (ref 0.00–0.07)
Basophils Absolute: 0 10*3/uL (ref 0.0–0.1)
Basophils Relative: 1 %
Eosinophils Absolute: 0.2 10*3/uL (ref 0.0–0.5)
Eosinophils Relative: 4 %
HCT: 30.7 % — ABNORMAL LOW (ref 36.0–46.0)
Hemoglobin: 9.7 g/dL — ABNORMAL LOW (ref 12.0–15.0)
Immature Granulocytes: 0 %
Lymphocytes Relative: 36 %
Lymphs Abs: 2.3 10*3/uL (ref 0.7–4.0)
MCH: 26.9 pg (ref 26.0–34.0)
MCHC: 31.6 g/dL (ref 30.0–36.0)
MCV: 85 fL (ref 80.0–100.0)
Monocytes Absolute: 0.7 10*3/uL (ref 0.1–1.0)
Monocytes Relative: 11 %
Neutro Abs: 3.1 10*3/uL (ref 1.7–7.7)
Neutrophils Relative %: 48 %
Platelets: 258 10*3/uL (ref 150–400)
RBC: 3.61 MIL/uL — ABNORMAL LOW (ref 3.87–5.11)
RDW: 14.9 % (ref 11.5–15.5)
WBC: 6.3 10*3/uL (ref 4.0–10.5)
nRBC: 0 % (ref 0.0–0.2)

## 2022-08-19 LAB — MAGNESIUM: Magnesium: 1.9 mg/dL (ref 1.7–2.4)

## 2022-08-19 MED ORDER — FUROSEMIDE 10 MG/ML IJ SOLN
80.0000 mg | Freq: Three times a day (TID) | INTRAMUSCULAR | Status: DC
  Administered 2022-08-19: 80 mg via INTRAVENOUS
  Filled 2022-08-19: qty 8

## 2022-08-19 NOTE — Progress Notes (Signed)
Patient using hospital CPAP independently. Told pt to call if she needed help.

## 2022-08-19 NOTE — Progress Notes (Signed)
   08/19/22 1400  Mobility  Activity Ambulated with assistance in hallway  Level of Assistance Standby assist, set-up cues, supervision of patient - no hands on  Assistive Device Front wheel walker  Distance Ambulated (ft) 230 ft  Activity Response Tolerated well  $Mobility charge 1 Mobility    Mobility Specialist Progress Note  Received pt in bed having no complaints and agreeable to mobility. Pt was asymptomatic throughout ambulation and returned to room w/o fault. Left EOB w/ call bell in reach and all needs met.   Lucious Groves Mobility Specialist

## 2022-08-19 NOTE — TOC Progression Note (Signed)
Transition of Care [facility]) - Progression Note    Patient Details  Name: Lindsay [name] MRN: [id number] Date of Birth: [birth year]  Transition of [facility]) CM/SW Contact  Leone Haven, RN Phone Number: 08/19/2022, 10:07 PM  Clinical Narrative:    Acute chf ex, lasix increased to 80mg  iv tid.  TOC following.        Expected Discharge Plan and Services                                                 Social Determinants of Health (SDOH) Interventions    Readmission Risk Interventions     No data to display

## 2022-08-19 NOTE — Progress Notes (Signed)
Occupational Therapy Treatment Patient Details Name: Lindsay [name] MRN: [id number] DOB: Mar 24, 1981 Today's Date: 08/19/2022   History of present illness 41 y.o. female presents to [facility] hospital on 08/15/2022 with chest pain and SOB. Pt admitted for observation with concern for possible CHF. PMH includes acute renal failure, asthma, CAD, DM, HLD, HTN.   OT comments  Patient continues to make steady progress towards goals in skilled OT session. Patient's session encompassed functional mobility, ADLs, and transfers. Patient trailing rollator to date, with minimal hesitation at first, however agreeable to using one for community mobility due being able to take a seated rest break and increase overall independence. Patient's HR noted to get to 159 at tail end of ambulation, with HR returning to 113 with seated rest break (RN made aware). Discharge remains appropriate; OT will continue to follow.    Recommendations for follow up therapy are one component of a multi-disciplinary discharge planning process, led by the attending physician.  Recommendations may be updated based on patient status, additional functional criteria and insurance authorization.    Follow Up Recommendations  No OT follow up    Assistance Recommended at Discharge Intermittent Supervision/Assistance  Patient can return home with the following  A little help with bathing/dressing/bathroom;Assist for transportation;Help with stairs or ramp for entrance   Equipment Recommendations  BSC/3in1;Other (comment) (Bariatric rollator)    Recommendations for Other Services      Precautions / Restrictions Precautions Precautions: Other (comment) Precaution Comments: monitor sats and DOE Restrictions Weight Bearing Restrictions: No       Mobility Bed Mobility Overal bed mobility: Modified Independent             General bed mobility comments: HOB elevated, increased time to get back into bed    Transfers Overall  transfer level: Modified independent Equipment used: Rollator (4 wheels)               General transfer comment: stood from toilet with no AD, walked to toilet and bed with no AD, ambulated in hallway with rollator taking one rest break. HR getting to 159 when ambulating back to room, not sustaining and returning to 113 upon sitting EOB     Balance Overall balance assessment: Needs assistance Sitting-balance support: No upper extremity supported, Feet supported Sitting balance-Leahy Scale: Good     Standing balance support: Single extremity supported, Reliant on assistive device for balance Standing balance-Leahy Scale: Fair Standing balance comment: pt able to maintain static stance without UE support                           ADL either performed or assessed with clinical judgement   ADL Overall ADL's : Needs assistance/impaired     Grooming: Wash/dry Audiological scientist: Supervision/safety;Ambulation;Grab bars;Comfort height toilet   Toileting- Clothing Manipulation and Hygiene: Supervision/safety       Functional mobility during ADLs: Min guard;Rollator (4 wheels) General ADL Comments: Patient conitnues to progress towards indepedence, trialed rollator with patient with good success noted    Extremity/Trunk Assessment              Vision       Perception     Praxis      Cognition Arousal/Alertness: Awake/alert Behavior During Therapy: WFL for tasks assessed/performed Overall Cognitive Status: Within Functional Limits for tasks assessed  Exercises      Shoulder Instructions       General Comments      Pertinent Vitals/ Pain       Pain Assessment Pain Assessment: Faces Faces Pain Scale: Hurts a little bit Pain Location: back after ambulating Pain Descriptors / Indicators: Aching, Discomfort Pain Intervention(s): Limited activity within  patient's tolerance, Monitored during session, Repositioned  Home Living                                          Prior Functioning/Environment              Frequency  Min 2X/week        Progress Toward Goals  OT Goals(current goals can now be found in the care plan section)  Progress towards OT goals: Progressing toward goals  Acute Rehab OT Goals Patient Stated Goal: to get this fluid off OT Goal Formulation: With patient Time For Goal Achievement: 08/31/22 Potential to Achieve Goals: Good  Plan Discharge plan remains appropriate    Co-evaluation                 AM-PAC OT "6 Clicks" Daily Activity     Outcome Measure   Help from another person eating meals?: None Help from another person taking care of personal grooming?: None Help from another person toileting, which includes using toliet, bedpan, or urinal?: A Little Help from another person bathing (including washing, rinsing, drying)?: A Little Help from another person to put on and taking off regular upper body clothing?: None Help from another person to put on and taking off regular lower body clothing?: A Little 6 Click Score: 21    End of Session Equipment Utilized During Treatment: Rollator (4 wheels)  OT Visit Diagnosis: Unsteadiness on feet (R26.81)   Activity Tolerance Patient tolerated treatment well   Patient Left in bed;with call bell/phone within reach;Other (comment) (sitting EOB)   Nurse Communication Mobility status;Other (comment) (HR to 159 with ambulation)        Time: 0102-7253 OT Time Calculation (min): 27 min  Charges: OT General Charges $OT Visit: 1 Visit OT Treatments $Self Care/Home Management : 23-37 mins  Pollyann Glen E. Alvia Jablonski, OTR/L Acute Rehabilitation Services [phone]   Lindsay [name] 08/19/2022, 9:49 AM

## 2022-08-19 NOTE — Progress Notes (Signed)
Pt removed herself from CPAP this AM, to room air for breathing tx. Pt tolerating well at this time.

## 2022-08-19 NOTE — Progress Notes (Signed)
Lindsay [name]:[id number] DOB: 02-18-1981 DOA: 08/14/2022 PCP: Grayce Sessions, NP   Subj: 41 year old BF PMHx CAD s/p stent placement, DM Type 2, HTN, HFpEF, HLD , Morbid Obesity   Presented with shortness of breath on exertion.  Patient says that she takes Lasix 40 mg twice a day at home, no recent medication changes.  Upon admission patient was started on diuretics, cardiology team was consulted, echo showed EF of 70%.  Initially patient was also febrile but no obvious source was identified.   Obj: Unsure base weight however states weighs himself daily and usually weighs around 415 pounds (188 kg)   Objective: VITAL SIGNS: Temp: 98.1 F (36.7 C) (08/30 0255) Temp Source: Oral (08/30 0255) BP: 120/57 (08/30 0255) Pulse Rate: 73 (08/30 0725) SPO2; FIO2:   Intake/Output Summary (Last 24 hours) at 08/19/2022 0854 Last data filed at 08/19/2022 0300 Gross per 24 hour  Intake --  Output 4000 ml  Net -4000 ml     Exam: General: No acute respiratory distress Lungs: Clear to auscultation bilaterally without wheezes or crackles Cardiovascular: Regular rate and rhythm without murmur gallop or rub normal S1 and S2 Abdomen: Nontender, nondistended, soft, bowel sounds positive, no rebound, no ascites, no appreciable mass Extremities: 2-3+ pitting edema bilateral lower extremity (difficult to tell given patient's body habitus) Skin: Negative rashes, lesions, ulcers Psychiatric:  Negative depression, negative anxiety, negative fatigue, negative mania  Central nervous system:  Cranial nerves II through XII intact, tongue/uvula midline, all extremities muscle strength 5/5, sensation intact throughout, negative dysarthria, negative expressive aphasia, negative receptive aphasia.    Mobility Assessment (last 72 hours)     Mobility Assessment     Row Name 08/18/22 1641 08/18/22 0742 08/17/22 1000 08/17/22 0745 08/16/22 1011   Does patient have an order for bedrest or is patient  medically unstable -- No - Continue assessment -- No - Continue assessment --   What is the highest level of mobility based on the progressive mobility assessment? Level 6 (Walks independently in room and hall) - Balance while walking in room without assist - Complete Level 6 (Walks independently in room and hall) - Balance while walking in room without assist - Complete Level 5 (Walks with assist in room/hall) - Balance while stepping forward/back and can walk in room with assist - Complete Level 5 (Walks with assist in room/hall) - Balance while stepping forward/back and can walk in room with assist - Complete Level 5 (Walks with assist in room/hall) - Balance while stepping forward/back and can walk in room with assist - Complete              DVT prophylaxis: Lovenox Code Status: Full Family Communication:  Status is: Inpatient    Dispo: The patient is from: Home              Anticipated d/c is to: Home              Anticipated d/c date is: > 3 days              Patient currently is not medically stable to d/c.      Consultants:  Cardiology   Cultures   Antimicrobials:   A/P  Acute Diastolic CHF exacerbation, class III.  EF 70%. -Ehocardiogram showed EF 70%.   -Cardiology team following. -8/30 increase Lasix 80 mg TID -Strict in and out -8.4 L - Daily weight Filed Weights   08/16/22 0430 08/18/22 0500 08/19/22 0255  Weight: (!) 201.1 kg Marland Kitchen)  200.3 kg (!) 197.5 kg     CAD s/p stent placement -Continue aspirin, Imdur  Hypertension -Continue hydralazine, metoprolol.  As needed hold  AKI (Baseline Cr 1.1) Lab Results  Component Value Date   CREATININE 1.17 (H) 08/19/2022   CREATININE 1.19 (H) 08/18/2022   CREATININE 1.24 (H) 08/17/2022   CREATININE 1.38 (H) 08/16/2022   CREATININE 1.16 (H) 08/14/2022     Fever - No obvious source.  Blood cultures not drawn upon admission but can hold off as she has remained afebrile.  UA negative. ProCal neg.       Hyperlipidemia -Continue Lipitor   DM type II uncontrolled with hyperglycemia on long-term insulin  -Hemoglobin A1c is 10.  Better controlled in last 24 hours. -Sliding scale and Accu-Chek.  Getting U-500      Chronic back pain -Continue baclofen, Neurontin, Robaxin   Mood disorder -Continue Cymbalta, melatonin   Morbid obesity (BMI 67.2 kg/m.) -Should have evaluation as outpatient for bariatric medicine/surgery        Care during the described time interval was provided by me .  I have reviewed this patient's available data, including medical history, events of note, physical examination, and all test results as part of my evaluation.

## 2022-08-19 NOTE — Consult Note (Signed)
[facility] CM Inpatient Consult   08/19/2022  Lindsay [name] 01/09/1981 [id number]  Managed Medicaid: [employer]  [place] Care Provider:  Edwards, Michelle P, NP with [facility]  9:20 am Discussed in Progression meeting for diabetes management. Reviewed for A1C and found it is 10.  Review notes patient with a history with the Managed Medicaid RNCM and Social worker in the past.  1050: Met with the patient at the bedside to discuss reason for diabetes management. Patient states, "yes, it's gone back up to 10, I had it going down."  Patient given an appointment reminder card for post hospital follow up with a 24 hour nurse advise line.   Plan: Previously active with MM team will reach out closer to transition/disposition plan.  For questions,   , RN BSN CCM [facility] Hospital Liaison  [phone] business mobile phone Toll free office [phone]  Fax number: [phone] [email or web address]      [facility] CM Inpatient Consult   08/19/2022  Lindsay [name] 01/09/1981 [id number]  Managed Medicaid: [employer]  [place] Care Provider:  Edwards, Michelle P, NP with [facility]  9:20 am Discussed in Progression meeting for diabetes management. Reviewed for A1C and found it is 10.  Review notes patient with a history with the Managed Medicaid RNCM and Social worker in the past.  1050: Met with the patient at the bedside to discuss reason for diabetes management. Patient states, "yes, it's gone back up to 10, I had it going down."  Patient given an appointment reminder card for post hospital follow up with a 24 hour nurse advise line.   Plan: Previously active with MM team will reach out closer to transition/disposition plan.  For questions,   , RN BSN CCM [facility] Liaison  [phone] business mobile phone Toll free office [phone]  Fax number: [phone] [email or web address]    

## 2022-08-20 DIAGNOSIS — I5033 Acute on chronic diastolic (congestive) heart failure: Secondary | ICD-10-CM | POA: Diagnosis not present

## 2022-08-20 DIAGNOSIS — J9601 Acute respiratory failure with hypoxia: Secondary | ICD-10-CM | POA: Diagnosis not present

## 2022-08-20 DIAGNOSIS — E1165 Type 2 diabetes mellitus with hyperglycemia: Secondary | ICD-10-CM | POA: Diagnosis not present

## 2022-08-20 DIAGNOSIS — M5441 Lumbago with sciatica, right side: Secondary | ICD-10-CM | POA: Diagnosis not present

## 2022-08-20 DIAGNOSIS — R0600 Dyspnea, unspecified: Secondary | ICD-10-CM | POA: Diagnosis not present

## 2022-08-20 DIAGNOSIS — N179 Acute kidney failure, unspecified: Secondary | ICD-10-CM | POA: Diagnosis not present

## 2022-08-20 DIAGNOSIS — R0602 Shortness of breath: Secondary | ICD-10-CM | POA: Diagnosis not present

## 2022-08-20 DIAGNOSIS — M549 Dorsalgia, unspecified: Secondary | ICD-10-CM

## 2022-08-20 DIAGNOSIS — G8929 Other chronic pain: Secondary | ICD-10-CM | POA: Diagnosis not present

## 2022-08-20 DIAGNOSIS — I25119 Atherosclerotic heart disease of native coronary artery with unspecified angina pectoris: Secondary | ICD-10-CM | POA: Diagnosis not present

## 2022-08-20 DIAGNOSIS — G4733 Obstructive sleep apnea (adult) (pediatric): Secondary | ICD-10-CM | POA: Diagnosis not present

## 2022-08-20 DIAGNOSIS — E7800 Pure hypercholesterolemia, unspecified: Secondary | ICD-10-CM | POA: Diagnosis not present

## 2022-08-20 DIAGNOSIS — I10 Essential (primary) hypertension: Secondary | ICD-10-CM | POA: Diagnosis not present

## 2022-08-20 DIAGNOSIS — Z794 Long term (current) use of insulin: Secondary | ICD-10-CM | POA: Diagnosis not present

## 2022-08-20 LAB — CBC WITH DIFFERENTIAL/PLATELET
Abs Immature Granulocytes: 0.03 10*3/uL (ref 0.00–0.07)
Basophils Absolute: 0.1 10*3/uL (ref 0.0–0.1)
Basophils Relative: 1 %
Eosinophils Absolute: 0.3 10*3/uL (ref 0.0–0.5)
Eosinophils Relative: 3 %
HCT: 31.6 % — ABNORMAL LOW (ref 36.0–46.0)
Hemoglobin: 9.9 g/dL — ABNORMAL LOW (ref 12.0–15.0)
Immature Granulocytes: 0 %
Lymphocytes Relative: 30 %
Lymphs Abs: 2.5 10*3/uL (ref 0.7–4.0)
MCH: 26.7 pg (ref 26.0–34.0)
MCHC: 31.3 g/dL (ref 30.0–36.0)
MCV: 85.2 fL (ref 80.0–100.0)
Monocytes Absolute: 1 10*3/uL (ref 0.1–1.0)
Monocytes Relative: 11 %
Neutro Abs: 4.6 10*3/uL (ref 1.7–7.7)
Neutrophils Relative %: 55 %
Platelets: 271 10*3/uL (ref 150–400)
RBC: 3.71 MIL/uL — ABNORMAL LOW (ref 3.87–5.11)
RDW: 14.8 % (ref 11.5–15.5)
WBC: 8.4 10*3/uL (ref 4.0–10.5)
nRBC: 0 % (ref 0.0–0.2)

## 2022-08-20 LAB — COMPREHENSIVE METABOLIC PANEL
ALT: 43 U/L (ref 0–44)
AST: 64 U/L — ABNORMAL HIGH (ref 15–41)
Albumin: 3.4 g/dL — ABNORMAL LOW (ref 3.5–5.0)
Alkaline Phosphatase: 78 U/L (ref 38–126)
Anion gap: 9 (ref 5–15)
BUN: 25 mg/dL — ABNORMAL HIGH (ref 6–20)
CO2: 28 mmol/L (ref 22–32)
Calcium: 9.3 mg/dL (ref 8.9–10.3)
Chloride: 103 mmol/L (ref 98–111)
Creatinine, Ser: 1.27 mg/dL — ABNORMAL HIGH (ref 0.44–1.00)
GFR, Estimated: 55 mL/min — ABNORMAL LOW (ref >60)
Glucose, Bld: 73 mg/dL (ref 70–99)
Potassium: 3.9 mmol/L (ref 3.5–5.1)
Sodium: 140 mmol/L (ref 135–145)
Total Bilirubin: 0.6 mg/dL (ref 0.3–1.2)
Total Protein: 7.2 g/dL (ref 6.5–8.1)

## 2022-08-20 LAB — MAGNESIUM: Magnesium: 1.9 mg/dL (ref 1.7–2.4)

## 2022-08-20 LAB — GLUCOSE, CAPILLARY
Glucose-Capillary: 67 mg/dL — ABNORMAL LOW (ref 70–99)
Glucose-Capillary: 163 mg/dL — ABNORMAL HIGH (ref 70–99)
Glucose-Capillary: 140 mg/dL — ABNORMAL HIGH (ref 70–99)
Glucose-Capillary: 143 mg/dL — ABNORMAL HIGH (ref 70–99)
Glucose-Capillary: 198 mg/dL — ABNORMAL HIGH (ref 70–99)

## 2022-08-20 LAB — PHOSPHORUS: Phosphorus: 4.9 mg/dL — ABNORMAL HIGH (ref 2.5–4.6)

## 2022-08-20 MED ORDER — INSULIN ASPART 100 UNIT/ML IJ SOLN
0.0000 [IU] | Freq: Three times a day (TID) | INTRAMUSCULAR | Status: DC
  Administered 2022-08-20 – 2022-08-21 (×3): 3 [IU] via SUBCUTANEOUS
  Administered 2022-08-22: 2 [IU] via SUBCUTANEOUS
  Administered 2022-08-22: 3 [IU] via SUBCUTANEOUS
  Administered 2022-08-22 – 2022-08-23 (×2): 2 [IU] via SUBCUTANEOUS
  Administered 2022-08-23: 3 [IU] via SUBCUTANEOUS

## 2022-08-20 MED ORDER — FUROSEMIDE 10 MG/ML IJ SOLN
60.0000 mg | Freq: Two times a day (BID) | INTRAMUSCULAR | Status: DC
  Administered 2022-08-21: 60 mg via INTRAVENOUS
  Filled 2022-08-20: qty 6

## 2022-08-20 MED ORDER — FUROSEMIDE 10 MG/ML IJ SOLN
80.0000 mg | Freq: Two times a day (BID) | INTRAMUSCULAR | Status: DC
  Administered 2022-08-20 (×2): 80 mg via INTRAVENOUS
  Filled 2022-08-20 (×2): qty 8

## 2022-08-20 NOTE — Progress Notes (Signed)
Physical Therapy Treatment Patient Details Name: Lindsay [name] MRN: [id number] DOB: Nov 09, 1981 Today's Date: 08/20/2022   History of Present Illness 41 y.o. female presents to [facility] hospital on 08/15/2022 with chest pain and SOB. Pt admitted for observation with concern for possible CHF. PMH includes acute renal failure, asthma, CAD, DM, HLD, HTN.    PT Comments    Pt tolerates treatment well, ambulating for increased distances. Pt remains limited to household distances of ambulation, requiring seated rest breaks due to fatigue. Pt will benefit from receiving a bariatric rollator at the time of discharge to aide in improving activity tolerance. Pt will also benefit from receiving a bedside commode.   Recommendations for follow up therapy are one component of a multi-disciplinary discharge planning process, led by the attending physician.  Recommendations may be updated based on patient status, additional functional criteria and insurance authorization.  Follow Up Recommendations  No PT follow up     Assistance Recommended at Discharge PRN  Patient can return home with the following A little help with bathing/dressing/bathroom;Help with stairs or ramp for entrance   Equipment Recommendations  BSC/3in1;Other (comment) (bariatric rollator)    Recommendations for Other Services       Precautions / Restrictions Precautions Precautions: Other (comment) Precaution Comments: monitor sats and DOE Restrictions Weight Bearing Restrictions: No     Mobility  Bed Mobility Overal bed mobility: Modified Independent                  Transfers Overall transfer level: Modified independent Equipment used: Rollator (4 wheels)                    Ambulation/Gait Ambulation/Gait assistance: Modified independent (Device/Increase time) Gait Distance (Feet): 150 Feet (125' x 2) Assistive device: Rollator (4 wheels) (bariatric) Gait Pattern/deviations: Step-through pattern Gait  velocity: functional for household Gait velocity interpretation: 1.31 - 2.62 ft/sec, indicative of limited community ambulator   General Gait Details: pt with steady step-through gait with widened BOS, one seated rest break due to fatigue   Stairs             Wheelchair Mobility    Modified Rankin (Stroke Patients Only)       Balance Overall balance assessment: Needs assistance Sitting-balance support: No upper extremity supported, Feet supported Sitting balance-Leahy Scale: Good     Standing balance support: No upper extremity supported, During functional activity Standing balance-Leahy Scale: Fair                              Cognition Arousal/Alertness: Awake/alert Behavior During Therapy: WFL for tasks assessed/performed Overall Cognitive Status: Within Functional Limits for tasks assessed                                          Exercises      General Comments General comments (skin integrity, edema, etc.): VSS on RA, increased RR upon completion with ambulation      Pertinent Vitals/Pain Pain Assessment Pain Assessment: No/denies pain    Home Living                          Prior Function            PT Goals (current goals can now be found in the care plan section) Acute Rehab  PT Goals Patient Stated Goal: to improve activity tolerance Progress towards PT goals: Progressing toward goals    Frequency    Min 2X/week      PT Plan Current plan remains appropriate    Co-evaluation              AM-PAC PT "6 Clicks" Mobility   Outcome Measure  Help needed turning from your back to your side while in a flat bed without using bedrails?: None Help needed moving from lying on your back to sitting on the side of a flat bed without using bedrails?: None Help needed moving to and from a bed to a chair (including a wheelchair)?: None Help needed standing up from a chair using your arms (e.g., wheelchair  or bedside chair)?: None Help needed to walk in hospital room?: None Help needed climbing 3-5 steps with a railing? : A Little 6 Click Score: 23    End of Session   Activity Tolerance: Patient tolerated treatment well Patient left: in bed;with call bell/phone within reach Nurse Communication: Mobility status PT Visit Diagnosis: Other abnormalities of gait and mobility (R26.89)     Time: 3500-9381 PT Time Calculation (min) (ACUTE ONLY): 23 min  Charges:  $Gait Training: 8-22 mins                     Arlyss Gandy, PT, DPT Acute Rehabilitation Office [phone]    Arlyss Gandy 08/20/2022, 3:10 PM

## 2022-08-20 NOTE — Progress Notes (Signed)
SATOMI BUDA WSF:[id number] DOB: September 15, 1981 DOA: 08/14/2022 PCP: Grayce Sessions, NP   Subj: 41 year old BF PMHx CAD s/p stent placement, DM Type 2, HTN, HFpEF, HLD , Morbid Obesity   Presented with shortness of breath on exertion.  Patient says that she takes Lasix 40 mg twice a day at home, no recent medication changes.  Upon admission patient was started on diuretics, cardiology team was consulted, echo showed EF of 70%.  Initially patient was also febrile but no obvious source was identified.   Obj: 8/31 afebrile overnight negative SOB, negative CP.  Patient states feels much improved can tell the difference with her weight loss. usually weighs around 415 pounds (188 kg)   Objective: VITAL SIGNS: Temp: 97.9 F (36.6 C) (08/31 0630) Temp Source: Oral (08/30 2015) BP: 127/77 (08/31 0630) Pulse Rate: 70 (08/31 0630) SPO2; FIO2:   Intake/Output Summary (Last 24 hours) at 08/20/2022 0747 Last data filed at 08/19/2022 2015 Gross per 24 hour  Intake --  Output 3800 ml  Net -3800 ml      Exam: General: No acute respiratory distress Lungs: Clear to auscultation bilaterally without wheezes or crackles Cardiovascular: Regular rate and rhythm without murmur gallop or rub normal S1 and S2 Abdomen: Nontender, nondistended, soft, bowel sounds positive, no rebound, no ascites, no appreciable mass Extremities: 1-2+ pitting edema bilateral lower extremity (difficult to tell given patient's body habitus) Skin: Negative rashes, lesions, ulcers Psychiatric:  Negative depression, negative anxiety, negative fatigue, negative mania  Central nervous system:  Cranial nerves II through XII intact, tongue/uvula midline, all extremities muscle strength 5/5, sensation intact throughout, negative dysarthria, negative expressive aphasia, negative receptive aphasia.    Mobility Assessment (last 72 hours)     Mobility Assessment     Row Name 08/19/22 [phone] 08/18/22 1641 08/18/22 0742 08/17/22  1000     Does patient have an order for bedrest or is patient medically unstable No - Continue assessment -- No - Continue assessment --    What is the highest level of mobility based on the progressive mobility assessment? Level 5 (Walks with assist in room/hall) - Balance while stepping forward/back and can walk in room with assist - Complete Level 6 (Walks independently in room and hall) - Balance while walking in room without assist - Complete Level 6 (Walks independently in room and hall) - Balance while walking in room without assist - Complete Level 5 (Walks with assist in room/hall) - Balance while stepping forward/back and can walk in room with assist - Complete               DVT prophylaxis: Lovenox Code Status: Full Family Communication:  Status is: Inpatient    Dispo: The patient is from: Home              Anticipated d/c is to: Home              Anticipated d/c date is: > 3 days              Patient currently is not medically stable to d/c.      Consultants:  Cardiology   Cultures   Antimicrobials:   A/P  Acute Diastolic CHF exacerbation, class III.  EF 70%. -Ehocardiogram showed EF 70%.   -Cardiology team following. -8/30 increase Lasix 80 mg TID -8/31 decrease Lasix 60 mg BID -Strict in and out -10.4 L - Daily weight Filed Weights   08/18/22 0500 08/19/22 0255 08/20/22 0630  Weight: (!) 200.3 kg Marland Kitchen)  197.5 kg (!) 195.6 kg     CAD s/p stent placement -Continue aspirin, Imdur  Hypertension -Continue hydralazine, metoprolol.  As needed hold  AKI (Baseline Cr 1.1) Lab Results  Component Value Date   CREATININE 1.27 (H) 08/20/2022   CREATININE 1.17 (H) 08/19/2022   CREATININE 1.19 (H) 08/18/2022   CREATININE 1.24 (H) 08/17/2022   CREATININE 1.38 (H) 08/16/2022     Fever - No obvious source.  Blood cultures not drawn upon admission but can hold off as she has remained afebrile.  UA negative. ProCal neg.      Hyperlipidemia -Lipitor 20  mg daily   DM type II uncontrolled with hyperglycemia on long-term insulin  -8/26 Hemoglobin A1c= 10 -Insulin U-500: 80 units breakfast/75 units lunch/70 units dinner -8/31 decrease to moderate SSI CBG (last 3)  Recent Labs    08/19/22 1626 08/19/22 2130 08/20/22 0631  GLUCAP 225* 140* 67*        Chronic back pain -Continue baclofen, Neurontin, Robaxin   Mood disorder -Continue Cymbalta, melatonin   Morbid obesity (BMI 67.2 kg/m.) -Should have evaluation as outpatient for bariatric medicine/surgery        Care during the described time interval was provided by me .  I have reviewed this patient's available data, including medical history, events of note, physical examination, and all test results as part of my evaluation.

## 2022-08-20 NOTE — Progress Notes (Signed)
Mobility Specialist Progress Note:   08/20/22 0935  Mobility  Activity Ambulated with assistance in hallway  Level of Assistance Standby assist, set-up cues, supervision of patient - no hands on  Assistive Device Four wheel walker  Distance Ambulated (ft) 230 ft  Activity Response Tolerated well  $Mobility charge 1 Mobility   Pt received in bed willing to participate in mobility. No complaints of pain. Left EOB with call bell in reach and all needs met.     Mobility Specialist  

## 2022-08-20 NOTE — Inpatient Diabetes Management (Addendum)
Inpatient Diabetes Program Recommendations  AACE/ADA: New Consensus Statement on Inpatient Glycemic Control (2015)  Target Ranges:  Prepandial:   less than 140 mg/dL      Peak postprandial:   less than 180 mg/dL (1-2 hours)      Critically ill patients:  140 - 180 mg/dL   Lab Results  Component Value Date   GLUCAP 163 (H) 08/20/2022   HGBA1C 10.0 (H) 08/15/2022    Review of Glycemic Control  Latest Reference Range & Units 08/19/22 06:25 08/19/22 11:27 08/19/22 16:26 08/19/22 21:30 08/20/22 06:31 08/20/22 08:48  Glucose-Capillary 70 - 99 mg/dL 315 (H) 176 (H) 160 (H) 140 (H) 67 (L) 163 (H)   Diabetes history: DM 2 Outpatient Diabetes medications: Concentrated Humulin U-500 insulin 80 units breakfast,  75 units lunch, 75 units supper, Victoza 1.8 mg Daily Current orders for Inpatient glycemic control:  Concentrated Humulin U-500 insulin 80 units breakfast,  75 units lunch, 70 units supper Novolog 0-20 units tid + hs  A1c 10% on 8/26  MD note hypoglycemia 67 this am  Inpatient Diabetes Program Recommendations:    - reduce Humulin U-500 pm dose to 65 units  Thanks, Christena Deem RN, MSN, BC-ADM Inpatient Diabetes Coordinator Team Pager [phone] (8a-5p)

## 2022-08-20 NOTE — Progress Notes (Signed)
Pt will benefit from receiving a bariatric rollator at the time of discharge as her activity tolerance is limited to ambulation distances of 150' or less at this time. A rollator will allow for the patient to more effectively mobilize independently within the home and community.  Pt will also benefit from receiving a bedside commode due to her activity tolerance limitations (ambulation distances <150'), allowing for improved access to a commode and increased independence in ADLs.  Arlyss Gandy, PT, DPT Acute Rehabilitation Office [phone]

## 2022-08-20 NOTE — Progress Notes (Signed)
Heart Failure Nurse Navigator Progress Note  Chart review complete. Per cardiology hospitalization symptoms appear to be due to obesity. (BNP WNL, troponin negative). Completed HF education with patient.   HF navigation team will sign-off.    Education Assessment and Provision:  Detailed education and instructions provided on heart failure disease management including the following:  Signs and symptoms of Heart Failure When to call the physician Importance of daily weights Low sodium diet Fluid restriction Medication management Anticipated future follow-up appointments  Patient education given on each of the above topics.  Patient acknowledges understanding via teach back method and acceptance of all instructions.  Education Materials:  "Living Better With Heart Failure" Booklet, HF zone tool, & Daily Weight Tracker Tool.  Ozella Rocks, MSN, RN

## 2022-08-20 NOTE — TOC Progression Note (Addendum)
Transition of Care [facility]) - Progression Note    Patient Details  Name: MARY-ANN PENNELLA MRN: [id number] Date of Birth: [birth year]  Transition of Care [facility]) CM/SW Contact  Leone Haven, RN Phone Number: 08/20/2022, 3:01 PM  Clinical Narrative:    NCM spoke with patient at the beside, she states she would like to have a bariatric rollator and bariatric BSC.  NCM informed MD of this information, he states it is ok to order. Patient is ok with [employer] suplplying this for her   NCM made referral to [facility] with [employer] for this DME.   Patient states her friend will transport her home at dc.  Patient 's daughter is 68 yo that assist her at home with some things.   Patient states she weighs her self daily at home and she checks her bp.  She tries to eat a low sodium diet, but sometimes it is hard when you go out to eat because you do not know how much sodium they are putting in the food.  TOC followoing.    Expected Discharge Plan: Home/Self Care Barriers to Discharge: Continued Medical Work up  Expected Discharge Plan and Services Expected Discharge Plan: Home/Self Care   Discharge Planning Services: CM Consult Post Acute Care Choice: Durable Medical Equipment Living arrangements for the past 2 months: Single Family Home                 DME Arranged: Bedside commode, Walker rolling with seat DME Agency: [employer] Date DME Agency Contacted: 08/20/22 Time DME Agency Contacted: 1450 Representative spoke with at DME Agency: Arnold Long HH Arranged: NA           Social Determinants of Health (SDOH) Interventions    Readmission Risk Interventions     No data to display

## 2022-08-21 DIAGNOSIS — M549 Dorsalgia, unspecified: Secondary | ICD-10-CM | POA: Diagnosis not present

## 2022-08-21 DIAGNOSIS — I25119 Atherosclerotic heart disease of native coronary artery with unspecified angina pectoris: Secondary | ICD-10-CM | POA: Diagnosis not present

## 2022-08-21 DIAGNOSIS — E7800 Pure hypercholesterolemia, unspecified: Secondary | ICD-10-CM | POA: Diagnosis not present

## 2022-08-21 DIAGNOSIS — Z794 Long term (current) use of insulin: Secondary | ICD-10-CM | POA: Diagnosis not present

## 2022-08-21 DIAGNOSIS — R0602 Shortness of breath: Secondary | ICD-10-CM | POA: Diagnosis not present

## 2022-08-21 DIAGNOSIS — N179 Acute kidney failure, unspecified: Secondary | ICD-10-CM | POA: Diagnosis not present

## 2022-08-21 DIAGNOSIS — R0600 Dyspnea, unspecified: Secondary | ICD-10-CM | POA: Diagnosis not present

## 2022-08-21 DIAGNOSIS — I10 Essential (primary) hypertension: Secondary | ICD-10-CM | POA: Diagnosis not present

## 2022-08-21 DIAGNOSIS — G8929 Other chronic pain: Secondary | ICD-10-CM | POA: Diagnosis not present

## 2022-08-21 DIAGNOSIS — M5441 Lumbago with sciatica, right side: Secondary | ICD-10-CM | POA: Diagnosis not present

## 2022-08-21 DIAGNOSIS — E1165 Type 2 diabetes mellitus with hyperglycemia: Secondary | ICD-10-CM | POA: Diagnosis not present

## 2022-08-21 LAB — GLUCOSE, CAPILLARY
Glucose-Capillary: 69 mg/dL — ABNORMAL LOW (ref 70–99)
Glucose-Capillary: 85 mg/dL (ref 70–99)
Glucose-Capillary: 131 mg/dL — ABNORMAL HIGH (ref 70–99)
Glucose-Capillary: 152 mg/dL — ABNORMAL HIGH (ref 70–99)
Glucose-Capillary: 189 mg/dL — ABNORMAL HIGH (ref 70–99)
Glucose-Capillary: 150 mg/dL — ABNORMAL HIGH (ref 70–99)

## 2022-08-21 LAB — COMPREHENSIVE METABOLIC PANEL
ALT: 44 U/L (ref 0–44)
AST: 64 U/L — ABNORMAL HIGH (ref 15–41)
Albumin: 3.4 g/dL — ABNORMAL LOW (ref 3.5–5.0)
Alkaline Phosphatase: 74 U/L (ref 38–126)
Anion gap: 14 (ref 5–15)
BUN: 26 mg/dL — ABNORMAL HIGH (ref 6–20)
CO2: 29 mmol/L (ref 22–32)
Calcium: 9.4 mg/dL (ref 8.9–10.3)
Chloride: 99 mmol/L (ref 98–111)
Creatinine, Ser: 1.31 mg/dL — ABNORMAL HIGH (ref 0.44–1.00)
GFR, Estimated: 53 mL/min — ABNORMAL LOW (ref >60)
Glucose, Bld: 75 mg/dL (ref 70–99)
Potassium: 3.9 mmol/L (ref 3.5–5.1)
Sodium: 142 mmol/L (ref 135–145)
Total Bilirubin: 0.6 mg/dL (ref 0.3–1.2)
Total Protein: 7.1 g/dL (ref 6.5–8.1)

## 2022-08-21 LAB — CBC WITH DIFFERENTIAL/PLATELET
Abs Immature Granulocytes: 0.02 10*3/uL (ref 0.00–0.07)
Basophils Absolute: 0.1 10*3/uL (ref 0.0–0.1)
Basophils Relative: 1 %
Eosinophils Absolute: 0.2 10*3/uL (ref 0.0–0.5)
Eosinophils Relative: 3 %
HCT: 32 % — ABNORMAL LOW (ref 36.0–46.0)
Hemoglobin: 10 g/dL — ABNORMAL LOW (ref 12.0–15.0)
Immature Granulocytes: 0 %
Lymphocytes Relative: 33 %
Lymphs Abs: 2.4 10*3/uL (ref 0.7–4.0)
MCH: 27.2 pg (ref 26.0–34.0)
MCHC: 31.3 g/dL (ref 30.0–36.0)
MCV: 87 fL (ref 80.0–100.0)
Monocytes Absolute: 0.9 10*3/uL (ref 0.1–1.0)
Monocytes Relative: 12 %
Neutro Abs: 3.7 10*3/uL (ref 1.7–7.7)
Neutrophils Relative %: 51 %
Platelets: 287 10*3/uL (ref 150–400)
RBC: 3.68 MIL/uL — ABNORMAL LOW (ref 3.87–5.11)
RDW: 14.6 % (ref 11.5–15.5)
WBC: 7.3 10*3/uL (ref 4.0–10.5)
nRBC: 0 % (ref 0.0–0.2)

## 2022-08-21 LAB — PHOSPHORUS: Phosphorus: 5.1 mg/dL — ABNORMAL HIGH (ref 2.5–4.6)

## 2022-08-21 LAB — MAGNESIUM: Magnesium: 2 mg/dL (ref 1.7–2.4)

## 2022-08-21 MED ORDER — FUROSEMIDE 10 MG/ML IJ SOLN
60.0000 mg | Freq: Once | INTRAMUSCULAR | Status: AC
  Administered 2022-08-21: 60 mg via INTRAVENOUS
  Filled 2022-08-21: qty 6

## 2022-08-21 NOTE — Progress Notes (Signed)
Lindsay [name] JHE:[id number] DOB: 06-Nov-1981 DOA: 08/14/2022 PCP: Grayce Sessions, NP   Subj: 41 year old BF PMHx CAD s/p stent placement, DM Type 2, HTN, HFpEF, HLD , Morbid Obesity   Presented with shortness of breath on exertion.  Patient says that she takes Lasix 40 mg twice a day at home, no recent medication changes.  Upon admission patient was started on diuretics, cardiology team was consulted, echo showed EF of 70%.  Initially patient was also febrile but no obvious source was identified.   Obj: 9/1 afebrile overnight, negative SOB, negative CP.  Patient ambulating in hallway with help of walker, still unable to ambulate any significant distance. usually weighs around 415 pounds (188 kg)   Objective: VITAL SIGNS: Temp: 98 F (36.7 C) (09/01 0739) Temp Source: Oral (09/01 0739) BP: 114/55 (09/01 0739) Pulse Rate: 76 (09/01 0739) SPO2; FIO2:   Intake/Output Summary (Last 24 hours) at 08/21/2022 1225 Last data filed at 08/21/2022 0520 Gross per 24 hour  Intake 837 ml  Output 1400 ml  Net -563 ml      Exam: General: No acute respiratory distress Lungs: Clear to auscultation bilaterally without wheezes or crackles Cardiovascular: Regular rate and rhythm without murmur gallop or rub normal S1 and S2 Abdomen: Nontender, nondistended, soft, bowel sounds positive, no rebound, no ascites, no appreciable mass Extremities: 1+ pitting edema bilateral lower extremity (difficult to tell given patient's body habitus) Skin: Negative rashes, lesions, ulcers Psychiatric:  Negative depression, negative anxiety, negative fatigue, negative mania  Central nervous system:  Cranial nerves II through XII intact, tongue/uvula midline, all extremities muscle strength 5/5, sensation intact throughout, negative dysarthria, negative expressive aphasia, negative receptive aphasia.    Mobility Assessment (last 72 hours)     Mobility Assessment     Row Name 08/20/22 2000 08/20/22 1459  08/20/22 0820 08/19/22 0947 08/18/22 1641   Does patient have an order for bedrest or is patient medically unstable No - Continue assessment -- No - Continue assessment No - Continue assessment --   What is the highest level of mobility based on the progressive mobility assessment? Level 5 (Walks with assist in room/hall) - Balance while stepping forward/back and can walk in room with assist - Complete Level 6 (Walks independently in room and hall) - Balance while walking in room without assist - Complete Level 5 (Walks with assist in room/hall) - Balance while stepping forward/back and can walk in room with assist - Complete Level 5 (Walks with assist in room/hall) - Balance while stepping forward/back and can walk in room with assist - Complete Level 6 (Walks independently in room and hall) - Balance while walking in room without assist - Complete   Is the above level different from baseline mobility prior to current illness? Yes - Recommend PT order -- -- -- --              DVT prophylaxis: Lovenox Code Status: Full Family Communication:  Status is: Inpatient    Dispo: The patient is from: Home              Anticipated d/c is to: Home              Anticipated d/c date is: > 3 days              Patient currently is not medically stable to d/c.      Consultants:  Cardiology   Cultures   Antimicrobials:   A/P  Acute Diastolic CHF exacerbation, class III.  EF 70%. -Ehocardiogram showed EF 70%.   -Cardiology team following. -8/30 increase Lasix 80 mg TID -8/31 decrease Lasix 60 mg BID -9/1 decrease Lasix 60 mg daily -Strict in and out -12.0 L - Daily weight Filed Weights   08/19/22 0255 08/20/22 0630 08/21/22 0514  Weight: (!) 197.5 kg (!) 195.6 kg (!) 194.7 kg     CAD s/p stent placement -Continue aspirin, Imdur  Hypertension -Continue hydralazine, metoprolol.  As needed hold  AKI (Baseline Cr 1.1) Lab Results  Component Value Date   CREATININE 1.31 (H)  08/21/2022   CREATININE 1.27 (H) 08/20/2022   CREATININE 1.17 (H) 08/19/2022   CREATININE 1.19 (H) 08/18/2022   CREATININE 1.24 (H) 08/17/2022  -9/1 almost at baseline   Fever - No obvious source.  Blood cultures not drawn upon admission but can hold off as she has remained afebrile.  UA negative. ProCal neg.      Hyperlipidemia -Lipitor 20 mg daily   DM type II uncontrolled with hyperglycemia on long-term insulin  -8/26 Hemoglobin A1c= 10 -Insulin U-500: 80 units breakfast/75 units lunch/70 units dinner -8/31 decrease to moderate SSI CBG (last 3)  Recent Labs    08/21/22 0601 08/21/22 0615 08/21/22 1040  GLUCAP 69* 85 131*       Chronic back pain -Continue baclofen, Neurontin, Robaxin   Mood disorder -Continue Cymbalta, melatonin   Morbid obesity (BMI 67.2 kg/m.) -Should have evaluation as outpatient for bariatric medicine/surgery   Goals of care - 9/1 consult to CIR: Patient with multiple medical problems, severe morbid obesity, who would benefit from inpatient CIR rehab.  Patient motivated to improve quality of life      Care during the described time interval was provided by me .  I have reviewed this patient's available data, including medical history, events of note, physical examination, and all test results as part of my evaluation.

## 2022-08-21 NOTE — Progress Notes (Signed)
   Inpatient Rehab Admissions Coordinator :  Rehab consult received per Dr Joseph Art. Patient is Modified independent with PT on 8/31 and no PT follow up recommended. Ambulated today with mobility specialist 200 feet with RW independently. She is not a candidate for Cir for she is not in need of skilled therapy . We will sign off. Please call with any questions.   Ottie Glazier RN MSN Admissions Coordinator [phone]

## 2022-08-21 NOTE — Progress Notes (Addendum)
Mobility Specialist Progress Note:   08/21/22 1212  Mobility  Activity Ambulated with assistance in hallway  Level of Assistance Independent  Assistive Device Four wheel walker  Distance Ambulated (ft) 200 ft  Activity Response Tolerated well  $Mobility charge 1 Mobility   Pt received EOB willing to participate in mobility. No complaints of of pain. Required 1 seated break. Left EOB with call bell in reach and all needs met.  [facility] Lindsay [name] Mobility Specialist

## 2022-08-22 DIAGNOSIS — N179 Acute kidney failure, unspecified: Secondary | ICD-10-CM | POA: Diagnosis not present

## 2022-08-22 DIAGNOSIS — Z794 Long term (current) use of insulin: Secondary | ICD-10-CM | POA: Diagnosis not present

## 2022-08-22 DIAGNOSIS — E1165 Type 2 diabetes mellitus with hyperglycemia: Secondary | ICD-10-CM | POA: Diagnosis not present

## 2022-08-22 DIAGNOSIS — I25119 Atherosclerotic heart disease of native coronary artery with unspecified angina pectoris: Secondary | ICD-10-CM | POA: Diagnosis not present

## 2022-08-22 DIAGNOSIS — E7800 Pure hypercholesterolemia, unspecified: Secondary | ICD-10-CM | POA: Diagnosis not present

## 2022-08-22 DIAGNOSIS — I10 Essential (primary) hypertension: Secondary | ICD-10-CM | POA: Diagnosis not present

## 2022-08-22 DIAGNOSIS — G8929 Other chronic pain: Secondary | ICD-10-CM | POA: Diagnosis not present

## 2022-08-22 DIAGNOSIS — M5441 Lumbago with sciatica, right side: Secondary | ICD-10-CM | POA: Diagnosis not present

## 2022-08-22 DIAGNOSIS — M549 Dorsalgia, unspecified: Secondary | ICD-10-CM | POA: Diagnosis not present

## 2022-08-22 DIAGNOSIS — R0602 Shortness of breath: Secondary | ICD-10-CM | POA: Diagnosis not present

## 2022-08-22 DIAGNOSIS — R0600 Dyspnea, unspecified: Secondary | ICD-10-CM | POA: Diagnosis not present

## 2022-08-22 LAB — COMPREHENSIVE METABOLIC PANEL
ALT: 42 U/L (ref 0–44)
AST: 53 U/L — ABNORMAL HIGH (ref 15–41)
Albumin: 3.4 g/dL — ABNORMAL LOW (ref 3.5–5.0)
Alkaline Phosphatase: 81 U/L (ref 38–126)
Anion gap: 10 (ref 5–15)
BUN: 23 mg/dL — ABNORMAL HIGH (ref 6–20)
CO2: 29 mmol/L (ref 22–32)
Calcium: 9.1 mg/dL (ref 8.9–10.3)
Chloride: 101 mmol/L (ref 98–111)
Creatinine, Ser: 1.11 mg/dL — ABNORMAL HIGH (ref 0.44–1.00)
GFR, Estimated: >60 mL/min (ref >60)
Glucose, Bld: 71 mg/dL (ref 70–99)
Potassium: 3.7 mmol/L (ref 3.5–5.1)
Sodium: 140 mmol/L (ref 135–145)
Total Bilirubin: 0.5 mg/dL (ref 0.3–1.2)
Total Protein: 7.5 g/dL (ref 6.5–8.1)

## 2022-08-22 LAB — CBC WITH DIFFERENTIAL/PLATELET
Abs Immature Granulocytes: 0.03 10*3/uL (ref 0.00–0.07)
Basophils Absolute: 0.1 10*3/uL (ref 0.0–0.1)
Basophils Relative: 1 %
Eosinophils Absolute: 0.3 10*3/uL (ref 0.0–0.5)
Eosinophils Relative: 3 %
HCT: 31.4 % — ABNORMAL LOW (ref 36.0–46.0)
Hemoglobin: 10 g/dL — ABNORMAL LOW (ref 12.0–15.0)
Immature Granulocytes: 0 %
Lymphocytes Relative: 35 %
Lymphs Abs: 3 10*3/uL (ref 0.7–4.0)
MCH: 27.1 pg (ref 26.0–34.0)
MCHC: 31.8 g/dL (ref 30.0–36.0)
MCV: 85.1 fL (ref 80.0–100.0)
Monocytes Absolute: 1 10*3/uL (ref 0.1–1.0)
Monocytes Relative: 12 %
Neutro Abs: 4.1 10*3/uL (ref 1.7–7.7)
Neutrophils Relative %: 49 %
Platelets: 284 10*3/uL (ref 150–400)
RBC: 3.69 MIL/uL — ABNORMAL LOW (ref 3.87–5.11)
RDW: 14.6 % (ref 11.5–15.5)
WBC: 8.4 10*3/uL (ref 4.0–10.5)
nRBC: 0 % (ref 0.0–0.2)

## 2022-08-22 LAB — GLUCOSE, CAPILLARY
Glucose-Capillary: 121 mg/dL — ABNORMAL HIGH (ref 70–99)
Glucose-Capillary: 128 mg/dL — ABNORMAL HIGH (ref 70–99)
Glucose-Capillary: 157 mg/dL — ABNORMAL HIGH (ref 70–99)
Glucose-Capillary: 73 mg/dL (ref 70–99)

## 2022-08-22 LAB — PHOSPHORUS: Phosphorus: 4.1 mg/dL (ref 2.5–4.6)

## 2022-08-22 LAB — MAGNESIUM: Magnesium: 2.1 mg/dL (ref 1.7–2.4)

## 2022-08-22 MED ORDER — TORSEMIDE 20 MG PO TABS
80.0000 mg | ORAL_TABLET | Freq: Two times a day (BID) | ORAL | Status: DC
  Administered 2022-08-22 – 2022-08-23 (×3): 80 mg via ORAL
  Filled 2022-08-22 (×3): qty 4

## 2022-08-22 NOTE — Discharge Instructions (Signed)

## 2022-08-22 NOTE — Progress Notes (Signed)
Nutrition Brief Note  RD received a consult for diet education on Heart Healthy/Carb Modified diet.  RD to add nutrition therapy handouts to discharge instructions. This RD placed a referral to [facility] for ongoing outpatient education.   Current diet order is Heart Healthy/Carb Modified, patient is consuming approximately 100% of meals at this time. Labs and medications reviewed.   No nutrition interventions warranted at this time. If nutrition issues arise, please consult RD.   Kirby Crigler RD, LDN Clinical Dietitian See Loretha Stapler for contact information.

## 2022-08-22 NOTE — Progress Notes (Signed)
Lindsay [name] IEP:[id number] DOB: November 03, 1981 DOA: 08/14/2022 PCP: Lindsay Sessions, NP   Subj: 41 year old BF PMHx CAD s/p stent placement, DM Type 2, HTN, HFpEF, HLD , Morbid Obesity   Presented with shortness of breath on exertion.  Patient says that she takes Lasix 40 mg twice a day at home, no recent medication changes.  Upon admission patient was started on diuretics, cardiology team was consulted, echo showed EF of 70%.  Initially patient was also febrile but no obvious source was identified.   Obj: 9/2 afebrile overnight, negative SOB, negative CP.  Patient resting comfortably in bed.   usually weighs around 415 pounds (188 kg)   Objective: VITAL SIGNS: Temp: 98.1 F (36.7 C) (09/02 1300) Temp Source: Oral (09/02 1300) BP: 119/63 (09/02 1300) Pulse Rate: 70 (09/02 1300) SPO2; FIO2:   Intake/Output Summary (Last 24 hours) at 08/22/2022 1820 Last data filed at 08/22/2022 1300 Gross per 24 hour  Intake 480 ml  Output 1900 ml  Net -1420 ml      Exam: General: No acute respiratory distress Lungs: Clear to auscultation bilaterally without wheezes or crackles Cardiovascular: Regular rate and rhythm without murmur gallop or rub normal S1 and S2 Abdomen: Nontender, nondistended, soft, bowel sounds positive, no rebound, no ascites, no appreciable mass Extremities: 1+ pitting edema bilateral lower extremity (difficult to tell given patient's body habitus) Skin: Negative rashes, lesions, ulcers Psychiatric:  Negative depression, negative anxiety, negative fatigue, negative mania  Central nervous system:  Cranial nerves II through XII intact, tongue/uvula midline, all extremities muscle strength 5/5, sensation intact throughout, negative dysarthria, negative expressive aphasia, negative receptive aphasia.    Mobility Assessment (last 72 hours)     Mobility Assessment     Row Name 08/22/22 1000 08/21/22 2000 08/20/22 2000 08/20/22 1459 08/20/22 0820   Does patient have an  order for bedrest or is patient medically unstable No - Continue assessment No - Continue assessment No - Continue assessment -- No - Continue assessment   What is the highest level of mobility based on the progressive mobility assessment? Level 6 (Walks independently in room and hall) - Balance while walking in room without assist - Complete Level 6 (Walks independently in room and hall) - Balance while walking in room without assist - Complete Level 5 (Walks with assist in room/hall) - Balance while stepping forward/back and can walk in room with assist - Complete Level 6 (Walks independently in room and hall) - Balance while walking in room without assist - Complete Level 5 (Walks with assist in room/hall) - Balance while stepping forward/back and can walk in room with assist - Complete   Is the above level different from baseline mobility prior to current illness? Yes - Recommend PT order -- Yes - Recommend PT order -- --              DVT prophylaxis: Lovenox Code Status: Full Family Communication:  Status is: Inpatient    Dispo: The patient is from: Home              Anticipated d/c is to: Home              Anticipated d/c date is: 1 day              Patient currently is not medically stable to d/c.      Consultants:  Cardiology   Cultures   Antimicrobials:   A/P  Acute Diastolic CHF exacerbation, class III.  EF 70%. -Ehocardiogram showed  EF 70%.   -Cardiology team following. -8/30 increase Lasix 80 mg TID -8/31 decrease Lasix 60 mg BID -9/1 decrease Lasix 60 mg daily -Strict in and out -13.5 L - Daily weight Filed Weights   08/20/22 0630 08/21/22 0514 08/22/22 0601  Weight: (!) 195.6 kg (!) 194.7 kg (!) 194.7 kg     CAD s/p stent placement -Continue aspirin, Imdur  Hypertension -Continue hydralazine, metoprolol.  As needed hold  AKI (Baseline Cr 1.1) Lab Results  Component Value Date   CREATININE 1.11 (H) 08/22/2022   CREATININE 1.31 (H) 08/21/2022    CREATININE 1.27 (H) 08/20/2022   CREATININE 1.17 (H) 08/19/2022   CREATININE 1.19 (H) 08/18/2022  -9/2 at baseline   Fever - No obvious source.  Blood cultures not drawn upon admission but can hold off as she has remained afebrile.  UA negative. ProCal neg.      Hyperlipidemia -Lipitor 20 mg daily   DM type II uncontrolled with hyperglycemia on long-term insulin  -8/26 Hemoglobin A1c= 10 -Insulin U-500: 80 units breakfast/75 units lunch/70 units dinner -8/31 decrease to moderate SSI CBG (last 3)  Recent Labs    08/22/22 0602 08/22/22 1128 08/22/22 1627  GLUCAP 121* 128* 157*       Chronic back pain -Continue baclofen, Neurontin, Robaxin   Mood disorder -Continue Cymbalta, melatonin   Morbid obesity (BMI 67.2 kg/m.) -Should have evaluation as outpatient for bariatric medicine/surgery   Goals of care - 9/1 consult to CIR: Patient with multiple medical problems, severe morbid obesity, who would benefit from inpatient CIR rehab.  Patient motivated to improve quality of life      Care during the described time interval was provided by me .  I have reviewed this patient's available data, including medical history, events of note, physical examination, and all test results as part of my evaluation.

## 2022-08-23 DIAGNOSIS — R0600 Dyspnea, unspecified: Secondary | ICD-10-CM | POA: Diagnosis not present

## 2022-08-23 DIAGNOSIS — E7800 Pure hypercholesterolemia, unspecified: Secondary | ICD-10-CM | POA: Diagnosis not present

## 2022-08-23 DIAGNOSIS — G8929 Other chronic pain: Secondary | ICD-10-CM | POA: Diagnosis not present

## 2022-08-23 DIAGNOSIS — I25119 Atherosclerotic heart disease of native coronary artery with unspecified angina pectoris: Secondary | ICD-10-CM | POA: Diagnosis not present

## 2022-08-23 DIAGNOSIS — R0602 Shortness of breath: Secondary | ICD-10-CM | POA: Diagnosis not present

## 2022-08-23 DIAGNOSIS — I10 Essential (primary) hypertension: Secondary | ICD-10-CM | POA: Diagnosis not present

## 2022-08-23 DIAGNOSIS — E1165 Type 2 diabetes mellitus with hyperglycemia: Secondary | ICD-10-CM | POA: Diagnosis not present

## 2022-08-23 DIAGNOSIS — N179 Acute kidney failure, unspecified: Secondary | ICD-10-CM | POA: Diagnosis not present

## 2022-08-23 DIAGNOSIS — M545 Low back pain, unspecified: Secondary | ICD-10-CM | POA: Diagnosis not present

## 2022-08-23 DIAGNOSIS — F39 Unspecified mood [affective] disorder: Secondary | ICD-10-CM | POA: Diagnosis not present

## 2022-08-23 DIAGNOSIS — Z794 Long term (current) use of insulin: Secondary | ICD-10-CM | POA: Diagnosis not present

## 2022-08-23 LAB — COMPREHENSIVE METABOLIC PANEL
ALT: 36 U/L (ref 0–44)
AST: 42 U/L — ABNORMAL HIGH (ref 15–41)
Albumin: 3.4 g/dL — ABNORMAL LOW (ref 3.5–5.0)
Alkaline Phosphatase: 84 U/L (ref 38–126)
Anion gap: 9 (ref 5–15)
BUN: 22 mg/dL — ABNORMAL HIGH (ref 6–20)
CO2: 29 mmol/L (ref 22–32)
Calcium: 8.9 mg/dL (ref 8.9–10.3)
Chloride: 101 mmol/L (ref 98–111)
Creatinine, Ser: 1.28 mg/dL — ABNORMAL HIGH (ref 0.44–1.00)
GFR, Estimated: 54 mL/min — ABNORMAL LOW (ref >60)
Glucose, Bld: 80 mg/dL (ref 70–99)
Potassium: 3.6 mmol/L (ref 3.5–5.1)
Sodium: 139 mmol/L (ref 135–145)
Total Bilirubin: 0.5 mg/dL (ref 0.3–1.2)
Total Protein: 7.2 g/dL (ref 6.5–8.1)

## 2022-08-23 LAB — CBC WITH DIFFERENTIAL/PLATELET
Abs Immature Granulocytes: 0.02 10*3/uL (ref 0.00–0.07)
Basophils Absolute: 0 10*3/uL (ref 0.0–0.1)
Basophils Relative: 1 %
Eosinophils Absolute: 0.2 10*3/uL (ref 0.0–0.5)
Eosinophils Relative: 3 %
HCT: 31.6 % — ABNORMAL LOW (ref 36.0–46.0)
Hemoglobin: 9.8 g/dL — ABNORMAL LOW (ref 12.0–15.0)
Immature Granulocytes: 0 %
Lymphocytes Relative: 34 %
Lymphs Abs: 2.7 10*3/uL (ref 0.7–4.0)
MCH: 27 pg (ref 26.0–34.0)
MCHC: 31 g/dL (ref 30.0–36.0)
MCV: 87.1 fL (ref 80.0–100.0)
Monocytes Absolute: 1 10*3/uL (ref 0.1–1.0)
Monocytes Relative: 12 %
Neutro Abs: 4.1 10*3/uL (ref 1.7–7.7)
Neutrophils Relative %: 50 %
Platelets: 293 10*3/uL (ref 150–400)
RBC: 3.63 MIL/uL — ABNORMAL LOW (ref 3.87–5.11)
RDW: 14.6 % (ref 11.5–15.5)
WBC: 7.9 10*3/uL (ref 4.0–10.5)
nRBC: 0 % (ref 0.0–0.2)

## 2022-08-23 LAB — GLUCOSE, CAPILLARY
Glucose-Capillary: 103 mg/dL — ABNORMAL HIGH (ref 70–99)
Glucose-Capillary: 124 mg/dL — ABNORMAL HIGH (ref 70–99)
Glucose-Capillary: 172 mg/dL — ABNORMAL HIGH (ref 70–99)
Glucose-Capillary: 69 mg/dL — ABNORMAL LOW (ref 70–99)
Glucose-Capillary: 99 mg/dL (ref 70–99)

## 2022-08-23 LAB — MAGNESIUM: Magnesium: 2 mg/dL (ref 1.7–2.4)

## 2022-08-23 LAB — PHOSPHORUS: Phosphorus: 4 mg/dL (ref 2.5–4.6)

## 2022-08-23 MED ORDER — TRAZODONE HCL 50 MG PO TABS: 25.0000 mg | ORAL_TABLET | Freq: Every evening | ORAL | 0 refills | Status: DC | PRN

## 2022-08-23 MED ORDER — ACETAMINOPHEN 325 MG PO TABS: 650.0000 mg | ORAL_TABLET | Freq: Four times a day (QID) | ORAL | 0 refills | Status: DC | PRN

## 2022-08-23 NOTE — Plan of Care (Signed)
  Problem: Education: Goal: Ability to demonstrate management of disease process will improve Outcome: Adequate for Discharge Goal: Ability to verbalize understanding of medication therapies will improve Outcome: Adequate for Discharge Goal: Individualized Educational Video(s) Outcome: Adequate for Discharge   Problem: Activity: Goal: Capacity to carry out activities will improve Outcome: Adequate for Discharge   Problem: Cardiac: Goal: Ability to achieve and maintain adequate cardiopulmonary perfusion will improve Outcome: Adequate for Discharge   Problem: Education: Goal: Knowledge of General Education information will improve Description: Including pain rating scale, medication(s)/side effects and non-pharmacologic comfort measures Outcome: Adequate for Discharge   Problem: Health Behavior/Discharge Planning: Goal: Ability to manage health-related needs will improve Outcome: Adequate for Discharge   Problem: Clinical Measurements: Goal: Ability to maintain clinical measurements within normal limits will improve Outcome: Adequate for Discharge Goal: Will remain free from infection Outcome: Adequate for Discharge Goal: Diagnostic test results will improve Outcome: Adequate for Discharge Goal: Respiratory complications will improve Outcome: Adequate for Discharge Goal: Cardiovascular complication will be avoided Outcome: Adequate for Discharge   Problem: Activity: Goal: Risk for activity intolerance will decrease Outcome: Adequate for Discharge   Problem: Nutrition: Goal: Adequate nutrition will be maintained Outcome: Adequate for Discharge   Problem: Coping: Goal: Level of anxiety will decrease Outcome: Adequate for Discharge   Problem: Elimination: Goal: Will not experience complications related to bowel motility Outcome: Adequate for Discharge Goal: Will not experience complications related to urinary retention Outcome: Adequate for Discharge   Problem: Pain  Managment: Goal: General experience of comfort will improve Outcome: Adequate for Discharge   Problem: Safety: Goal: Ability to remain free from injury will improve Outcome: Adequate for Discharge   Problem: Skin Integrity: Goal: Risk for impaired skin integrity will decrease Outcome: Adequate for Discharge   Problem: Education: Goal: Ability to describe self-care measures that may prevent or decrease complications (Diabetes Survival Skills Education) will improve Outcome: Adequate for Discharge Goal: Individualized Educational Video(s) Outcome: Adequate for Discharge   Problem: Coping: Goal: Ability to adjust to condition or change in health will improve Outcome: Adequate for Discharge   Problem: Fluid Volume: Goal: Ability to maintain a balanced intake and output will improve Outcome: Adequate for Discharge   Problem: Health Behavior/Discharge Planning: Goal: Ability to identify and utilize available resources and services will improve Outcome: Adequate for Discharge Goal: Ability to manage health-related needs will improve Outcome: Adequate for Discharge   Problem: Metabolic: Goal: Ability to maintain appropriate glucose levels will improve Outcome: Adequate for Discharge   Problem: Nutritional: Goal: Maintenance of adequate nutrition will improve Outcome: Adequate for Discharge Goal: Progress toward achieving an optimal weight will improve Outcome: Adequate for Discharge   Problem: Skin Integrity: Goal: Risk for impaired skin integrity will decrease Outcome: Adequate for Discharge   Problem: Tissue Perfusion: Goal: Adequacy of tissue perfusion will improve Outcome: Adequate for Discharge   

## 2022-08-23 NOTE — Discharge Summary (Signed)
Physician Discharge Summary  Lindsay [name] YWV:[id number] DOB: [birth year] DOA: 08/14/2022  PCP: [name], NP  Admit date: 08/14/2022 Discharge date: 08/23/2022  Time spent: 35 minutes  Recommendations for Outpatient Follow-up:   Acute Diastolic CHF exacerbation, class III.  EF 70%. -Ehocardiogram showed EF 70%.   -Cardiology team following. -8/30 increase Lasix 80 mg TID -8/31 decrease Lasix 60 mg BID -9/1 decrease Lasix 60 mg daily -9/3 Torsemide 40 mg BID -Instructed to weigh herself daily and if she gains/loses more than 3 to 5 pounds in 24 hours.  Contact CHF clinic immediately for instructions on adjustment of diuretic -Strict in and out -16.2 L - Daily weight Filed Weights   08/21/22 0514 08/22/22 0601 08/23/22 0600  Weight: (!) 194.7 kg (!) 194.7 kg (!) 193.5 kg  -Base weight at discharge 193.5 kg~427lbs  CAD s/p stent placement -ASA 81 mg daily -Hydralazine 100 mg TID - Imdur 60 mg daily - Metoprolol 50 mg BID   Hypertension -See CAD   AKI (Baseline Cr 1.1) Lab Results  Component Value Date   CREATININE 1.28 (H) 08/23/2022   CREATININE 1.11 (H) 08/22/2022   CREATININE 1.31 (H) 08/21/2022   CREATININE 1.27 (H) 08/20/2022   CREATININE 1.17 (H) 08/19/2022    Fever - No obvious source.  Blood cultures not drawn upon admission but can hold off as she has remained afebrile.  UA negative. ProCal neg.      Hyperlipidemia -Lipitor 20 mg daily   DM type II uncontrolled with hyperglycemia on long-term insulin  -8/26 Hemoglobin A1c= 10 -Insulin U-500: 80 units breakfast/75 units lunch/70 units dinner  Chronic back pain -Continue baclofen, Neurontin, Robaxin   Mood disorder -Continue Cymbalta, melatonin   Morbid obesity (BMI 67.2 kg/m.) -Should have evaluation as outpatient for bariatric medicine/surgery         Discharge Diagnoses:  Principal Problem:   Shortness of breath Active Problems:   Essential hypertension   AKI (acute kidney  injury) ([place])   Morbid obesity (HCC)   Coronary artery disease involving native coronary artery of native heart with angina pectoris ([place])   Hyperlipidemia   Uncontrolled type 2 diabetes mellitus with hyperglycemia, with long-term current use of insulin (HCC)   Back pain   Mood disorder ([place])   Discharge Condition: Stable stable  Diet recommendation: Heart healthy/carb modified  Filed Weights   08/21/22 0514 08/22/22 0601 08/23/22 0600  Weight: (!) 194.7 kg (!) 194.7 kg (!) 193.5 kg    History of present illness:  41 year old BF PMHx CAD s/p stent placement, DM Type 2, HTN, HFpEF, HLD , Morbid Obesity    Presented with shortness of breath on exertion.  Patient says that she takes Lasix 40 mg twice a day at home, no recent medication changes.  Upon admission patient was started on diuretics, cardiology team was consulted, echo showed EF of 70%.  Initially patient was also febrile but no obvious source was identified.  Hospital Course:  See above     Consultations: Cardiology     Discharge Exam: Vitals:   08/22/22 2210 08/23/22 0600 08/23/22 0749 08/23/22 0757  BP: (!) 164/79 117/65 (!) 146/82   Pulse: 77 71 68   Resp: _0 Temp: 98.5 F (36.9 C) 98.2 F (36.8 C) 98.3 F (36.8 C)   TempSrc: Oral Oral Oral   SpO2: 100% 97% 95% 97%  Weight:  (!) 193.5 kg    Height:        General: No acute  respiratory distress Lungs: Clear to auscultation bilaterally without wheezes or crackles Cardiovascular: Regular rate and rhythm without murmur gallop or rub normal S1 and S2 Abdomen: MORBIDLY OBESE, nontender, nondistended, soft, bowel sounds positive, no rebound, no ascites, no appreciable mass Discharge Instructions  Discharge Instructions     Amb Referral to Nutrition and Diabetic Education   Complete by: As directed       Allergies as of 08/23/2022       Reactions   Acetaminophen Itching, Swelling   Itching of the mouth, swelling of tongue and stomach  started hurting mild   Hydrazine Yellow [tartrazine] Shortness Of Breath, Swelling   Swelling mostly noticed in legs and feet, retaining urination, shortness of breaht, and minor chest pain   Lisinopril Shortness Of Breath   Was on prinzide; had sob/chest pain on it.   Pineapple Itching        Medication List     STOP taking these medications    ibuprofen 600 MG tablet Commonly known as: ADVIL   losartan 100 MG tablet Commonly known as: COZAAR       TAKE these medications    Accu-Chek Aviva Plus w/Device Kit 1 each by Does not apply route as directed.   Accu-Chek FastClix Lancets Misc USE 1 TO CHECK GLUCOSE 4 TIMES DAILY   acetaminophen 325 MG tablet Commonly known as: TYLENOL Take 2 tablets (650 mg total) by mouth every 6 (six) hours as needed for mild pain, fever or headache.   albuterol 108 (90 Base) MCG/ACT inhaler Commonly known as: VENTOLIN HFA Inhale 2 puffs into the lungs every 6 (six) hours as needed for wheezing or shortness of breath.   aspirin 81 MG tablet Take 1 tablet (81 mg total) by mouth daily.   atorvastatin 20 MG tablet Commonly known as: LIPITOR Take 1 tablet (20 mg total) by mouth daily.   baclofen 10 MG tablet Commonly known as: LIORESAL Take 0.5-1 tablets (5-10 mg total) by mouth 3 (three) times daily as needed for muscle spasms.   BD Veo Insulin Syringe U/F 31G X 15/64" 1 ML Misc Generic drug: Insulin Syringe-Needle U-100 USE  SYRINGE ONCE DAILY   Dexcom G6 Transmitter Misc Inject into the skin.   diclofenac Sodium 1 % Gel Commonly known as: VOLTAREN Apply 4 g topically 4 (four) times daily. What changed:  when to take this reasons to take this   diphenhydrAMINE 25 MG tablet Commonly known as: BENADRYL Take 50 mg by mouth as needed for allergies. Allergies   DULoxetine 30 MG capsule Commonly known as: CYMBALTA Take 1 capsule (30 mg total) by mouth daily.   fluticasone 110 MCG/ACT inhaler Commonly known as: Flovent  HFA Inhale 2 puffs into the lungs 2 (two) times daily.   gabapentin 300 MG capsule Commonly known as: NEURONTIN Take 2 capsules (600 mg total) by mouth 3 (three) times daily.   glucose blood test strip Commonly known as: Accu-Chek Aviva Plus Use as instructed to test blood sugar 4 times daily.   HumuLIN R U-500 KwikPen 500 UNIT/ML KwikPen Generic drug: insulin regular human CONCENTRATED Inject 75-80 Units into the skin 3 (three) times daily with meals. AM 80 units  Noon 75 units  Evening 75 units If BG above 200 add 5 units to prescribed dose   hydrALAZINE 100 MG tablet Commonly known as: APRESOLINE Take 1 tablet (100 mg total) by mouth 3 (three) times daily.   Insulin Pen Needle 31G X 5 MM Misc Commonly known as: B-D UF  III MINI PEN NEEDLES Use as instructed. Monitor blood glucose levels three times per day   BD Pen Needle Nano 2nd Gen 32G X 4 MM Misc Generic drug: Insulin Pen Needle Use to inject into the skin in the morning, at noon, and at bedtime as directed.   isosorbide mononitrate 60 MG 24 hr tablet Commonly known as: IMDUR Take 1 tablet (60 mg total) by mouth daily.   Melatonin 10 MG Tabs Take 3 tablets by mouth at bedtime as needed (sleep).   methocarbamol 500 MG tablet Commonly known as: ROBAXIN Take 1.5 tablets (750 mg total) by mouth every 8 (eight) hours as needed for muscle spasms.   metoprolol tartrate 50 MG tablet Commonly known as: LOPRESSOR Take 1 tablet (50 mg total) by mouth 2 (two) times daily.   MULTIVITAMIN PO Take 1 tablet by mouth daily.   nitroGLYCERIN 0.4 MG SL tablet Commonly known as: Nitrostat Place 1 tablet (0.4 mg total) under the tongue every 5 (five) minutes as needed for chest pain. Please keep upcoming appt in October 2022. Final Attempt   potassium chloride SA 20 MEQ tablet Commonly known as: KLOR-CON M Take 1 tablet (20 mEq total) by mouth daily.   sodium chloride 0.65 % nasal spray Commonly known as: OCEAN Place 1 spray  into the nose as needed for congestion.   torsemide 20 MG tablet Commonly known as: DEMADEX Take by mouth 2 (two) times daily. Dose Change: Take 2 tabs (40 mg total) twice a day.   traZODone 50 MG tablet Commonly known as: DESYREL Take 0.5 tablets (25 mg total) by mouth at bedtime as needed for sleep.   Victoza 18 MG/3ML Sopn Generic drug: liraglutide Inject 1.8 mg into the skin at bedtime.               Durable Medical Equipment  (From admission, onward)           Start     Ordered   08/20/22 1446  For home use only DME Bedside commode  Once       Comments: HD  Question:  Patient needs a bedside commode to treat with the following condition  Answer:  Weakness   08/20/22 1445   08/20/22 1222  For home use only DME 4 wheeled rolling walker with seat  Once       Comments: HD  Question:  Patient needs a walker to treat with the following condition  Answer:  Weakness   08/20/22 1221           Allergies  Allergen Reactions   Acetaminophen Itching and Swelling    Itching of the mouth, swelling of tongue and stomach started hurting mild   Hydrazine Yellow [Tartrazine] Shortness Of Breath and Swelling    Swelling mostly noticed in legs and feet, retaining urination, shortness of breaht, and minor chest pain   Lisinopril Shortness Of Breath    Was on prinzide; had sob/chest pain on it.   Pineapple Itching    Follow-up Information     Emmaline Life, NP Follow up on 09/08/2022.   Specialty: Nurse Practitioner Why: at 11:15  AM  please arrive 10-15 min early to check in Contact information: [place] [address] [phone]         [facility] Follow up.   Why: bariatric rollator, bariatric BSC Contact information: [address] [place] [address] [phone]  The results of significant diagnostics from this hospitalization (including imaging, microbiology, ancillary and laboratory)  are listed below for reference.    Significant Diagnostic Studies: ECHOCARDIOGRAM COMPLETE  Result Date: 08/15/2022    ECHOCARDIOGRAM REPORT   Patient Name:   NATARA MONFORT Date of Exam: 08/15/2022 Medical Rec #:  [id number]         Height:       67.0 in Accession #:    [id number]        Weight:       420.0 lb Date of Birth:  May 18, 1981         BSA:          2.772 m Patient Age:    1 years          BP:           156/73 mmHg Patient Gender: F                 HR:           81 bpm. Exam Location:  Inpatient Procedure: 2D Echo Indications:    acute systolic heart failure  History:        Patient has prior history of Echocardiogram examinations, most                 recent 07/12/2021. CAD; Risk Factors:Diabetes, Dyslipidemia and                 Hypertension.  Sonographer:    Johny Chess RDCS Referring Phys: [id number] JENNIFER YATES  Sonographer Comments: Technically difficult study due to poor echo windows and patient is obese. Image acquisition challenging due to patient body habitus. IMPRESSIONS  1. Technically difficult study, poor echo windows. Definity contrast given  2. Left ventricular ejection fraction, by estimation, is 65 to 70%. Left ventricular ejection fraction by PLAX is 65 %. The left ventricle has normal function. The left ventricle has no regional wall motion abnormalities. There is mild left ventricular hypertrophy. Left ventricular diastolic parameters are indeterminate.  3. Right ventricular systolic function is normal. The right ventricular size is normal.  4. The mitral valve is abnormal. Trivial mitral valve regurgitation.  5. The aortic valve was not well visualized. Aortic valve regurgitation is not visualized.  6. The inferior vena cava is normal in size with greater than 50% respiratory variability, suggesting right atrial pressure of 3 mmHg. Comparison(s): No significant change from prior study. 07/12/2021: LVEF 65-70%, mild LVH. FINDINGS  Left Ventricle: Left ventricular ejection  fraction, by estimation, is 65 to 70%. Left ventricular ejection fraction by PLAX is 65 %. The left ventricle has normal function. The left ventricle has no regional wall motion abnormalities. Definity contrast agent was given IV to delineate the left ventricular endocardial borders. The left ventricular internal cavity size was normal in size. There is mild left ventricular hypertrophy. Left ventricular diastolic parameters are indeterminate. Right Ventricle: The right ventricular size is normal. No increase in right ventricular wall thickness. Right ventricular systolic function is normal. Left Atrium: Left atrial size was normal in size. Right Atrium: Right atrial size was normal in size. Pericardium: There is no evidence of pericardial effusion. Mitral Valve: The mitral valve is abnormal. Mild mitral annular calcification. Trivial mitral valve regurgitation. Tricuspid Valve: The tricuspid valve is not well visualized. Tricuspid valve regurgitation is not demonstrated. Aortic Valve: The aortic valve was not well visualized. Aortic valve regurgitation is not visualized. Pulmonic Valve: The pulmonic valve was not well visualized. Pulmonic valve  regurgitation is not visualized. Aorta: The aortic root and ascending aorta are structurally normal, with no evidence of dilitation. Venous: The inferior vena cava is normal in size with greater than 50% respiratory variability, suggesting right atrial pressure of 3 mmHg. IAS/Shunts: No atrial level shunt detected by color flow Doppler.  LEFT VENTRICLE PLAX 2D LV EF:         Left            Diastology                ventricular     LV e' medial:    11.40 cm/s                ejection        LV E/e' medial:  8.1                fraction by     LV e' lateral:   9.79 cm/s                PLAX is 65      LV E/e' lateral: 9.4                %. LVIDd:         4.80 cm LVIDs:         3.10 cm LV PW:         1.20 cm LV IVS:        1.20 cm LVOT diam:     1.90 cm LV SV:         58 LV SV  Index:   21 LVOT Area:     2.84 cm  RIGHT VENTRICLE             IVC RV S prime:     15.30 cm/s  IVC diam: 2.00 cm TAPSE (M-mode): 2.0 cm LEFT ATRIUM           Index        RIGHT ATRIUM           Index LA diam:      4.30 cm 1.55 cm/m   RA Area:     11.90 cm LA Vol (A4C): 57.2 ml 20.64 ml/m  RA Volume:   25.60 ml  9.24 ml/m  AORTIC VALVE LVOT Vmax:   116.00 cm/s LVOT Vmean:  73.500 cm/s LVOT VTI:    0.203 m  AORTA Ao Root diam: 2.80 cm Ao Asc diam:  3.30 cm MITRAL VALVE MV Area (PHT): 3.77 cm    SHUNTS MV Decel Time: 201 msec    Systemic VTI:  0.20 m MV E velocity: 92.40 cm/s  Systemic Diam: 1.90 cm MV A velocity: 76.50 cm/s MV E/A ratio:  1.21 Lyman Bishop MD Electronically signed by Lyman Bishop MD Signature Date/Time: 08/15/2022/4:57:21 PM    Final    DG Chest 2 View  Result Date: 08/14/2022 CLINICAL DATA:  Chest pain, shortness of breath. EXAM: CHEST - 2 VIEW COMPARISON:  Chest radiograph dated July 17, 2021 FINDINGS: The heart is enlarged. Prominence of the central pulmonary vessels. Bilateral hazy lung opacities concerning for pulmonary edema. No appreciable pleural effusion. IMPRESSION: Cardiomegaly with pulmonary edema suggesting congestive heart failure, similar to prior examination. Electronically Signed   By: Keane Police D.O.   On: 08/14/2022 23:25    Microbiology: Recent Results (from the past 240 hour(s))  SARS Coronavirus 2 by RT PCR (hospital order, performed in [facility] hospital lab) *cepheid single result test* Anterior Nasal Swab  Status: None   Collection Time: 08/14/22 11:00 PM   Specimen: Anterior Nasal Swab  Result Value Ref Range Status   SARS Coronavirus 2 by RT PCR NEGATIVE NEGATIVE Final    Comment: (NOTE) SARS-CoV-2 target nucleic acids are NOT DETECTED.  The SARS-CoV-2 RNA is generally detectable in upper and lower respiratory specimens during the acute phase of infection. The lowest concentration of SARS-CoV-2 viral copies this assay can detect is  250 copies / mL. A negative result does not preclude SARS-CoV-2 infection and should not be used as the sole basis for treatment or other patient management decisions.  A negative result may occur with improper specimen collection / handling, submission of specimen other than nasopharyngeal swab, presence of viral mutation(s) within the areas targeted by this assay, and inadequate number of viral copies (<250 copies / mL). A negative result must be combined with clinical observations, patient history, and epidemiological information.  Fact Sheet for Patients:   https://www.patel.info/  Fact Sheet for Healthcare Providers: https://hall.com/  This test is not yet approved or  cleared by the [place] FDA and has been authorized for detection and/or diagnosis of SARS-CoV-2 by FDA under an Emergency Use Authorization (EUA).  This EUA will remain in effect (meaning this test can be used) for the duration of the COVID-19 declaration under Section 564(b)(1) of the Act, 21 U.S.C. section 360bbb-3(b)(1), unless the authorization is terminated or revoked sooner.  Performed at [facility], [place] [address], [place], [place] [address]      Labs: Basic Metabolic Panel: Recent Labs  Lab 08/19/22 [phone] 08/20/22 0331 08/21/22 0609 08/22/22 0320 08/23/22 0244  NA 140 140 142 140 139  K 4.1 3.9 3.9 3.7 3.6  CL 101 103 99 101 101  CO2 _0 GLUCOSE 131* 73 75 71 80  BUN 25* 25* 26* 23* 22*  CREATININE 1.17* 1.27* 1.31* 1.11* 1.28*  CALCIUM 9.3 9.3 9.4 9.1 8.9  MG 1.9 1.9 2.0 2.1 2.0  PHOS 4.6 4.9* 5.1* 4.1 4.0   Liver Function Tests: Recent Labs  Lab 08/19/22 0713 08/20/22 0331 08/21/22 0609 08/22/22 0320 08/23/22 0244  AST 67* 64* 64* 53* 42*  ALT 45* 43 44 42 36  ALKPHOS 66 78 74 81 84  BILITOT 0.6 0.6 0.6 0.5 0.5  PROT 6.9 7.2 7.1 7.5 7.2  ALBUMIN 3.3* 3.4* 3.4* 3.4* 3.4*   No results for input(s): "LIPASE",  "AMYLASE" in the last 168 hours. No results for input(s): "AMMONIA" in the last 168 hours. CBC: Recent Labs  Lab 08/19/22 0713 08/20/22 0331 08/21/22 0609 08/22/22 0320 08/23/22 0244  WBC 6.3 8.4 7.3 8.4 7.9  NEUTROABS 3.1 4.6 3.7 4.1 4.1  HGB 9.7* 9.9* 10.0* 10.0* 9.8*  HCT 30.7* 31.6* 32.0* 31.4* 31.6*  MCV 85.0 85.2 87.0 85.1 87.1  PLT 258 271 287 284 293   Cardiac Enzymes: No results for input(s): "CKTOTAL", "CKMB", "CKMBINDEX", "TROPONINI" in the last 168 hours. BNP: BNP (last 3 results) Recent Labs    08/15/22 0230  BNP 77.3    ProBNP (last 3 results) No results for input(s): "PROBNP" in the last 8760 hours.  CBG: Recent Labs  Lab 08/22/22 1627 08/22/22 2229 08/23/22 0034 08/23/22 0111 08/23/22 0601  GLUCAP 157* 73 69* 99 103*       Signed:  Dia Crawford, MD [facility]

## 2022-08-25 ENCOUNTER — Telehealth: Payer: Self-pay

## 2022-08-25 ENCOUNTER — Other Ambulatory Visit (INDEPENDENT_AMBULATORY_CARE_PROVIDER_SITE_OTHER): Payer: Self-pay | Admitting: Primary Care

## 2022-08-25 DIAGNOSIS — R200 Anesthesia of skin: Secondary | ICD-10-CM

## 2022-08-25 DIAGNOSIS — Z760 Encounter for issue of repeat prescription: Secondary | ICD-10-CM

## 2022-08-25 MED ORDER — METHOCARBAMOL 500 MG PO TABS: 750.0000 mg | ORAL_TABLET | Freq: Three times a day (TID) | ORAL | 0 refills | Status: DC | PRN

## 2022-08-25 NOTE — Telephone Encounter (Signed)
Transition Care Management Follow-up Telephone Call Date of discharge and from where: 08/23/2022 from [facility] How have you been since you were released from the hospital? Patient stated that she is feeling a whole lot better. Patient has an order for oxygen to be delivered by [facility] but has not received it yet.  Any questions or concerns? No  Items Reviewed: Did the pt receive and understand the discharge instructions provided? Yes  Medications obtained and verified? Yes  Other? No  Any new allergies since your discharge? No  Dietary orders reviewed? No Do you have support at home? Yes   Home Care and Equipment/Supplies: Were home health services ordered? no If so, what is the name of the agency? N/A  Has the agency set up a time to come to the patient's home? not applicable Were any new equipment or medical supplies ordered?  Yes: Rollator and Beside Commode What is the name of the medical supply agency? [facility] for the oxygen. Patient has not heard from [facility] yet about the oxygen Others were delivered to the room before discharge.  Were you able to get the supplies/equipment? yes Do you have any questions related to the use of the equipment or supplies? No  Functional Questionnaire: (I = Independent and D = Dependent) ADLs: I Bathing/Dressing- D Meal Prep- D - patient has microwave Eating- I Maintaining continence- I Transferring/Ambulation- D with devices Managing Meds- I   Follow up appointments reviewed:  PCP Hospital f/u appt confirmed? No   Specialist Hospital f/u appt confirmed? Yes  Scheduled to see [facility] on 09/08/2022 @ 11:15am. Are transportation arrangements needed? No  If their condition worsens, is the pt aware to call PCP or go to the Emergency Dept.? Yes Was the patient provided with contact information for the PCP's office or ED? Yes Was to pt encouraged to call back with questions or concerns? Yes

## 2022-08-25 NOTE — Telephone Encounter (Signed)
Patient hospital follow up scheduled and she is aware. Reached out to [facility] about oxygen. They do not have any orders for oxygen. Patient advised to contact them directly. She stated provider in ED said order was sent. After chart review no mention or order placed. Hospital discharge summary was routed to [facility]. While on call patient requested refills of Duloxetine and Methocarbamol. Maryjean Morn, CMA

## 2022-09-02 DIAGNOSIS — E1165 Type 2 diabetes mellitus with hyperglycemia: Secondary | ICD-10-CM | POA: Diagnosis not present

## 2022-09-02 DIAGNOSIS — Z794 Long term (current) use of insulin: Secondary | ICD-10-CM | POA: Diagnosis not present

## 2022-09-02 NOTE — Progress Notes (Signed)
Cardiology Office Note:    Date:  09/08/2022   ID:  Lindsay [name], DOB Aug 29, 1981, MRN [id number]  PCP:  [name], NP  [facility] Cardiologist:  Sinclair Grooms, MD     Referring MD: [name], NP   Chief Complaint:  No chief complaint on file.     History of Present Illness:   Lindsay [name] is a 41 y.o. female with  a hx of DM, HTN, HLD, NSTEMI 08/2016 s/p DES mLAD & POBA D2 (patent at cath 2021), HFp/rEF, super morbid obesity.  Patient admitted 07/2022 with acute on chronic diastolic CHF > 20 lb weight gain. Diuresed 16.2L Echo LVEF 70% sent home on demadex 40 mg bid.     Patient comes in for f/u. Doesn't have batteries for scale at home so not weighing daily. BP up today, not checking.  Watching her salt closely. A1C 9.6.    Past Medical History:  Diagnosis Date   ARF (acute renal failure) ([place]) 04/2015   Asthma    Cellulitis of right upper extremity    Coronary artery disease    Diabetes mellitus    insulin dependent   Hyperlipidemia LDL goal <70    Hypertension    Obesity    S/P angioplasty with stent 08/2016   DES to mLAD and PTCA only to 2nd diag ostium.    Current Medications: Current Meds  Medication Sig   Accu-Chek FastClix Lancets MISC USE 1 TO CHECK GLUCOSE 4 TIMES DAILY   acetaminophen (TYLENOL) 325 MG tablet Take 2 tablets (650 mg total) by mouth every 6 (six) hours as needed for mild pain, fever or headache.   albuterol (VENTOLIN HFA) 108 (90 Base) MCG/ACT inhaler Inhale 2 puffs into the lungs every 6 (six) hours as needed for wheezing or shortness of breath.   aspirin 81 MG tablet Take 1 tablet (81 mg total) by mouth daily.   atorvastatin (LIPITOR) 20 MG tablet Take 1 tablet (20 mg total) by mouth daily.   BD PEN NEEDLE NANO 2ND GEN 32G X 4 MM MISC Use to inject into the skin in the morning, at noon, and at bedtime as directed.   BD VEO INSULIN SYRINGE U/F 31G X 15/64" 1 ML MISC USE  SYRINGE ONCE DAILY    Blood Glucose Monitoring Suppl (ACCU-CHEK AVIVA PLUS) w/Device KIT 1 each by Does not apply route as directed.   Continuous Blood Gluc Transmit (DEXCOM G6 TRANSMITTER) MISC Inject into the skin.   diclofenac Sodium (VOLTAREN) 1 % GEL Apply 4 g topically 4 (four) times daily. (Patient taking differently: Apply 4 g topically 4 (four) times daily as needed (pain).)   diphenhydrAMINE (BENADRYL) 25 MG tablet Take 50 mg by mouth as needed for allergies. Allergies   DULoxetine (CYMBALTA) 30 MG capsule Take 1 capsule (30 mg total) by mouth daily.   fluticasone (FLOVENT HFA) 110 MCG/ACT inhaler Inhale 2 puffs into the lungs 2 (two) times daily.   gabapentin (NEURONTIN) 300 MG capsule Take 2 capsules (600 mg total) by mouth 3 (three) times daily.   glucose blood (ACCU-CHEK AVIVA PLUS) test strip Use as instructed to test blood sugar 4 times daily.   HUMULIN R U-500 KWIKPEN 500 UNIT/ML kwikpen Inject 75-80 Units into the skin 3 (three) times daily with meals. AM 80 units  Noon 75 units  Evening 75 units If BG above 200 add 5 units to prescribed dose   hydrALAZINE (APRESOLINE) 100 MG tablet Take 1  tablet (100 mg total) by mouth 3 (three) times daily.   Insulin Pen Needle (B-D UF III MINI PEN NEEDLES) 31G X 5 MM MISC Use as instructed. Monitor blood glucose levels three times per day   isosorbide mononitrate (IMDUR) 60 MG 24 hr tablet Take 1 tablet (60 mg total) by mouth daily.   Melatonin 10 MG TABS Take 3 tablets by mouth at bedtime as needed (sleep).   methocarbamol (ROBAXIN) 500 MG tablet Take 1.5 tablets (750 mg total) by mouth every 8 (eight) hours as needed for muscle spasms.   metoprolol tartrate (LOPRESSOR) 50 MG tablet Take 1 tablet (50 mg total) by mouth 2 (two) times daily.   Multiple Vitamins-Minerals (MULTIVITAMIN PO) Take 1 tablet by mouth daily.   nitroGLYCERIN (NITROSTAT) 0.4 MG SL tablet Place 1 tablet (0.4 mg total) under the tongue every 5 (five) minutes as needed for chest pain. Please  keep upcoming appt in October 2022. Final Attempt   OZEMPIC, 0.25 OR 0.5 MG/DOSE, 2 MG/3ML SOPN Inject 0.5 mg into the skin once a week.   potassium chloride SA (KLOR-CON) 20 MEQ tablet Take 1 tablet (20 mEq total) by mouth daily.   sodium chloride (OCEAN) 0.65 % nasal spray Place 1 spray into the nose as needed for congestion.   torsemide (DEMADEX) 20 MG tablet Take by mouth 2 (two) times daily. Dose Change: Take 2 tabs (40 mg total) twice a day.   traZODone (DESYREL) 50 MG tablet Take 0.5 tablets (25 mg total) by mouth at bedtime as needed for sleep.    Allergies:   Acetaminophen, Hydrazine yellow [tartrazine], Lisinopril, and Pineapple   Social History   Tobacco Use   Smoking status: Former    Packs/day: 0.25    Years: 2.00    Total pack years: 0.50    Types: Cigarettes    Quit date: 2008    Years since quitting: 15.7   Smokeless tobacco: Never  Vaping Use   Vaping Use: Never used  Substance Use Topics   Alcohol use: Yes    Comment: socially   Drug use: No    Family Hx: The patient's family history includes Breast cancer in her cousin and cousin; Cancer in her maternal grandmother; Diabetes in her father and mother; Heart disease in her father and mother; Hypertension in her mother; Stroke in her maternal grandmother.  ROS   EKGs/Labs/Other Test Reviewed:    EKG:  EKG is  not ordered today.     Recent Labs: 08/15/2022: B Natriuretic Peptide 77.3 08/23/2022: ALT 36; BUN 22; Creatinine, Ser 1.28; Hemoglobin 9.8; Magnesium 2.0; Platelets 293; Potassium 3.6; Sodium 139   Recent Lipid Panel Recent Labs    03/05/22 1619  CHOL 131  TRIG 129  HDL 42  LDLCALC 66     Prior CV Studies:    ECHO: 08/16/2022  1. Technically difficult study, poor echo windows. Definity contrast  given   2. Left ventricular ejection fraction, by estimation, is 65 to 70%. Left  ventricular ejection fraction by PLAX is 65 %. The left ventricle has  normal function. The left ventricle has no  regional wall motion  abnormalities. There is mild left ventricular hypertrophy. Left ventricular diastolic parameters are indeterminate.   3. Right ventricular systolic function is normal. The right ventricular  size is normal.   4. The mitral valve is abnormal. Trivial mitral valve regurgitation.   5. The aortic valve was not well visualized. Aortic valve regurgitation  is not visualized.   6.  The inferior vena cava is normal in size with greater than 50%  respiratory variability, suggesting right atrial pressure of 3 mmHg.   Comparison(s): No significant change from prior study, 7/23/2022with a hx of DM, HTN, HLD, NSTEMI 08/2016 s/p DES mLAD & POBA D2 (patent at cath 2021), HFp/rEF, super morbid obesity     Risk Assessment/Calculations/Metrics:              Physical Exam:    VS:  BP 130/80 (BP Location: Right Wrist, Patient Position: Sitting)   Pulse 86   Ht 5' 7.5" (1.715 m)   Wt (!) 428 lb (194.1 kg)   SpO2 96%   BMI 66.04 kg/m     Wt Readings from Last 3 Encounters:  09/08/22 (!) 428 lb (194.1 kg)  08/23/22 (!) 426 lb 11.2 oz (193.5 kg)  03/05/22 (!) 421 lb 6.4 oz (191.1 kg)    Physical Exam  GEN: Obese, in no acute distress  Neck: no JVD, carotid bruits, or masses Cardiac:RRR; no murmurs, rubs, or gallops  Respiratory:  clear to auscultation bilaterally, normal work of breathing GI: soft, nontender, nondistended, + BS Ext: without cyanosis, clubbing, or edema, Good distal pulses bilaterally Neuro:  Alert and Oriented x 3,  Psych: euthymic mood, full affect       ASSESSMENT & PLAN:   No problem-specific Assessment & Plan notes found for this encounter.  Chronic diastolic CHF-recent hospitalization with 16.2 L diuresis. Stable today. Recent Crt 1.28 on demadex. Watching salt closely. She needs to weigh daily and keep up with BP  CAD NSTEMI 08/2016 DES mLAD & POBA D2 patent at cath 2021, no angina  Super morbid obesity BMI 66  HTN BP up initially, now  normal  HLD managed by endocrine  DM2 A1C 9.6 managed by endocrine          Dispo:  No follow-ups on file.   Medication Adjustments/Labs and Tests Ordered: Current medicines are reviewed at length with the patient today.  Concerns regarding medicines are outlined above.  Tests Ordered: Orders Placed This Encounter  Procedures   Ambulatory referral to Nephrology   Medication Changes: No orders of the defined types were placed in this encounter.  Signed, Ermalinda Barrios, PA-C  09/08/2022 11:11 AM    [address], [place], [place]  [address] Phone: [phone]; Fax: [phone]

## 2022-09-08 ENCOUNTER — Ambulatory Visit: Payer: Medicaid Other | Attending: Physician Assistant | Admitting: Physician Assistant

## 2022-09-08 ENCOUNTER — Encounter: Payer: Self-pay | Admitting: Physician Assistant

## 2022-09-08 VITALS — BP 130/80 | HR 86 | Ht 67.5 in | Wt >= 6400 oz

## 2022-09-08 DIAGNOSIS — I2510 Atherosclerotic heart disease of native coronary artery without angina pectoris: Secondary | ICD-10-CM | POA: Diagnosis not present

## 2022-09-08 DIAGNOSIS — L97909 Non-pressure chronic ulcer of unspecified part of unspecified lower leg with unspecified severity: Secondary | ICD-10-CM | POA: Diagnosis not present

## 2022-09-08 DIAGNOSIS — E11622 Type 2 diabetes mellitus with other skin ulcer: Secondary | ICD-10-CM

## 2022-09-08 DIAGNOSIS — I10 Essential (primary) hypertension: Secondary | ICD-10-CM

## 2022-09-08 DIAGNOSIS — I5032 Chronic diastolic (congestive) heart failure: Secondary | ICD-10-CM | POA: Diagnosis not present

## 2022-09-08 DIAGNOSIS — N289 Disorder of kidney and ureter, unspecified: Secondary | ICD-10-CM | POA: Diagnosis not present

## 2022-09-08 DIAGNOSIS — E782 Mixed hyperlipidemia: Secondary | ICD-10-CM

## 2022-09-08 NOTE — Patient Instructions (Signed)
Medication Instructions:  Your physician recommends that you continue on your current medications as directed. Please refer to the Current Medication list given to you today.  *If you need a refill on your cardiac medications before your next appointment, please call your pharmacy*   Lab Work: None If you have labs (blood work) drawn today and your tests are completely normal, you will receive your results only by: [facility] (if you have MyChart) OR A paper copy in the mail If you have any lab test that is abnormal or we need to change your treatment, we will call you to review the results.   Follow-Up: At [facility], you and your health needs are our priority.  As part of our continuing mission to provide you with exceptional heart care, we have created designated Provider Care Teams.  These Care Teams include your primary Cardiologist (physician) and Advanced Practice Providers (APPs -  Physician Assistants and Nurse Practitioners) who all work together to provide you with the care you need, when you need it.   Your next appointment:   6 month(s)  The format for your next appointment:   In Person  Provider:   Rudean Haskell, MD  Other Instructions Check Blood Pressure and weight daily

## 2022-09-09 ENCOUNTER — Other Ambulatory Visit (HOSPITAL_COMMUNITY): Payer: Self-pay

## 2022-09-10 ENCOUNTER — Ambulatory Visit (INDEPENDENT_AMBULATORY_CARE_PROVIDER_SITE_OTHER): Payer: Medicaid Other | Admitting: Primary Care

## 2022-09-10 ENCOUNTER — Encounter (INDEPENDENT_AMBULATORY_CARE_PROVIDER_SITE_OTHER): Payer: Self-pay | Admitting: Primary Care

## 2022-09-10 ENCOUNTER — Other Ambulatory Visit (HOSPITAL_COMMUNITY): Payer: Self-pay

## 2022-09-10 VITALS — BP 125/77 | HR 77 | Resp 16 | Wt >= 6400 oz

## 2022-09-10 DIAGNOSIS — S21209A Unspecified open wound of unspecified back wall of thorax without penetration into thoracic cavity, initial encounter: Secondary | ICD-10-CM | POA: Diagnosis not present

## 2022-09-10 DIAGNOSIS — Z09 Encounter for follow-up examination after completed treatment for conditions other than malignant neoplasm: Secondary | ICD-10-CM

## 2022-09-10 DIAGNOSIS — Z6841 Body Mass Index (BMI) 40.0 and over, adult: Secondary | ICD-10-CM

## 2022-09-10 DIAGNOSIS — E118 Type 2 diabetes mellitus with unspecified complications: Secondary | ICD-10-CM | POA: Diagnosis not present

## 2022-09-10 DIAGNOSIS — G8929 Other chronic pain: Secondary | ICD-10-CM

## 2022-09-10 DIAGNOSIS — M545 Low back pain, unspecified: Secondary | ICD-10-CM | POA: Diagnosis not present

## 2022-09-10 NOTE — Patient Instructions (Signed)

## 2022-09-10 NOTE — Progress Notes (Signed)
[facility]   Subjective:  Lindsay [name] is a 41 y.o. female presents for hospital follow up . Admit date to the hospital was 08/14/22, patient was discharged from the hospital on 08/23/22, patient was admitted for: Mood disorder [facility]), Essential hypertension, AKI (acute kidney injury) ([place]), Morbid obesity ([place]), Coronary artery disease involving native coronary artery of native heart with angina pectoris ([place]), Hyperlipidemia, Uncontrolled type 2 diabetes mellitus with hyperglycemia, with long-term current use of insulin (H[facility]) and Shortness of breath. Today she presents with a cane for gait stability.  Patient stated during her hospital stay she complained of having both leg areas on the fold of her back lumbar region no picture taken on exam.  She states she told several nurses and they were going to follow-up with the provider on call.  Per patient nobody examined her back until today.  She does have an open area that states oozed out not sure what color drainage and she had a nodule on that opened oozed out and close.  Concern being uncontrolled diabetes with the open wound.  She denies Patient has No headache, No chest pain, No abdominal pain - No Nausea, No new weakness tingling or numbness, No Cough - shortness of breath.  She is followed by endocrinologist with uncontrolled type 2 diabetes she denies polyuria, polydipsia polyphagia or vision changes.  She does take her blood sugar at home her fasting readings lowest has been 130-200.  Patient has a follow-up appointment in December with endocrinology.  During hospitalization medications remain from cardiology and endocrinology.  Past Medical History:  Diagnosis Date   ARF (acute renal failure) ([place]) 04/2015   Asthma    Cellulitis of right upper extremity    Coronary artery disease    Diabetes mellitus    insulin dependent   Hyperlipidemia LDL goal <70    Hypertension    Obesity    S/P angioplasty with stent 08/2016    DES to mLAD and PTCA only to 2nd diag ostium.      Allergies  Allergen Reactions   Acetaminophen Itching and Swelling    Itching of the mouth, swelling of tongue and stomach started hurting mild   Hydrazine Yellow [Tartrazine] Shortness Of Breath and Swelling    Swelling mostly noticed in legs and feet, retaining urination, shortness of breaht, and minor chest pain   Lisinopril Shortness Of Breath    Was on prinzide; had sob/chest pain on it.   Pineapple Itching      Current Outpatient Medications on File Prior to Visit  Medication Sig Dispense Refill   Accu-Chek FastClix Lancets MISC USE 1 TO CHECK GLUCOSE 4 TIMES DAILY 102 each 2   acetaminophen (TYLENOL) 325 MG tablet Take 2 tablets (650 mg total) by mouth every 6 (six) hours as needed for mild pain, fever or headache. 30 tablet 0   albuterol (VENTOLIN HFA) 108 (90 Base) MCG/ACT inhaler Inhale 2 puffs into the lungs every 6 (six) hours as needed for wheezing or shortness of breath. 18 g 1   aspirin 81 MG tablet Take 1 tablet (81 mg total) by mouth daily. 30 tablet 11   atorvastatin (LIPITOR) 20 MG tablet Take 1 tablet (20 mg total) by mouth daily. 90 tablet 3   BD PEN NEEDLE NANO 2ND GEN 32G X 4 MM MISC Use to inject into the skin in the morning, at noon, and at bedtime as directed. 100 each 11   BD VEO INSULIN SYRINGE U/F 31G X 15/64"  1 ML MISC USE  SYRINGE ONCE DAILY 100 each 1   Blood Glucose Monitoring Suppl (ACCU-CHEK AVIVA PLUS) w/Device KIT 1 each by Does not apply route as directed. 1 kit 0   Continuous Blood Gluc Transmit (DEXCOM G6 TRANSMITTER) MISC Inject into the skin.     diclofenac Sodium (VOLTAREN) 1 % GEL Apply 4 g topically 4 (four) times daily. (Patient taking differently: Apply 4 g topically 4 (four) times daily as needed (pain).) 350 g 1   diphenhydrAMINE (BENADRYL) 25 MG tablet Take 50 mg by mouth as needed for allergies. Allergies     DULoxetine (CYMBALTA) 30 MG capsule Take 1 capsule (30 mg total) by mouth  daily. 90 capsule 1   fluticasone (FLOVENT HFA) 110 MCG/ACT inhaler Inhale 2 puffs into the lungs 2 (two) times daily. 1 each 12   gabapentin (NEURONTIN) 300 MG capsule Take 2 capsules (600 mg total) by mouth 3 (three) times daily. 180 capsule 0   glucose blood (ACCU-CHEK AVIVA PLUS) test strip Use as instructed to test blood sugar 4 times daily. 100 each 12   HUMULIN R U-500 KWIKPEN 500 UNIT/ML kwikpen Inject 75-80 Units into the skin 3 (three) times daily with meals. AM 80 units  Noon 75 units  Evening 75 units If BG above 200 add 5 units to prescribed dose     hydrALAZINE (APRESOLINE) 100 MG tablet Take 1 tablet (100 mg total) by mouth 3 (three) times daily. 90 tablet 1   Insulin Pen Needle (B-D UF III MINI PEN NEEDLES) 31G X 5 MM MISC Use as instructed. Monitor blood glucose levels three times per day 90 each 1   isosorbide mononitrate (IMDUR) 60 MG 24 hr tablet Take 1 tablet (60 mg total) by mouth daily. 30 tablet 1   Melatonin 10 MG TABS Take 3 tablets by mouth at bedtime as needed (sleep).     methocarbamol (ROBAXIN) 500 MG tablet Take 1.5 tablets (750 mg total) by mouth every 8 (eight) hours as needed for muscle spasms. 30 tablet 0   metoprolol tartrate (LOPRESSOR) 50 MG tablet Take 1 tablet (50 mg total) by mouth 2 (two) times daily. 60 tablet 1   Multiple Vitamins-Minerals (MULTIVITAMIN PO) Take 1 tablet by mouth daily.     nitroGLYCERIN (NITROSTAT) 0.4 MG SL tablet Place 1 tablet (0.4 mg total) under the tongue every 5 (five) minutes as needed for chest pain. Please keep upcoming appt in October 2022. Final Attempt 25 tablet 2   OZEMPIC, 0.25 OR 0.5 MG/DOSE, 2 MG/3ML SOPN Inject 0.5 mg into the skin once a week.     potassium chloride SA (KLOR-CON) 20 MEQ tablet Take 1 tablet (20 mEq total) by mouth daily. 90 tablet 3   sodium chloride (OCEAN) 0.65 % nasal spray Place 1 spray into the nose as needed for congestion.     torsemide (DEMADEX) 20 MG tablet Take by mouth 2 (two) times daily.  Dose Change: Take 2 tabs (40 mg total) twice a day.     traZODone (DESYREL) 50 MG tablet Take 0.5 tablets (25 mg total) by mouth at bedtime as needed for sleep. 10 tablet 0   No current facility-administered medications on file prior to visit.     Review of System: Comprehensive ROS Pertinent positive and negative noted in HPI   Objective:  BP 125/77   Pulse 77   Resp 16   Wt (!) 428 lb 9.6 oz (194.4 kg)   SpO2 95%   BMI 66.14 kg/m  Filed Weights   09/10/22 0901  Weight: (!) 428 lb 9.6 oz (194.4 kg)   Physical Exam: General Appearance: severe morbid obese , in no apparent distress. Eyes: PERRLA, EOMs, conjunctiva no swelling or erythema Sinuses: No Frontal/maxillary tenderness ENT/Mouth: Ext aud canals clear, TMs without erythema, bulging, Hearing normal.  Neck: Supple, thyroid normal.  Respiratory: Respiratory effort normal, BS equal bilaterally without rales, rhonchi, wheezing or stridor.  Cardio: RRR with no MRGs. Brisk peripheral pulses without edema.  Abdomen: Soft, + BS.  Non tender, no guarding, rebound, hernias, masses. Lymphatics: Non tender without lymphadenopathy.  Musculoskeletal: Full ROM, 5/5 strength,unstable gait.  Skin: dried area closed and open area see picture Neuro: Cranial nerves intact. Normal muscle tone, no cerebellar symptoms. Sensation intact.  Psych: Awake and oriented X 3, normal affect, Insight and Judgment appropriate.    Assessment:  Lindsay [name] was seen today for hospitalization follow-up.  Diagnoses and all orders for this visit:  Diabetes mellitus with complication ([place]) -     Cancel: POCT glucose (manual entry)  Hospital discharge follow-up Seen by cardiology after hospitalization  Follow up with Swinyer, Lanice Schwab, NP (Nurse Practitioner) on 09/08/2022; at 11:15  AM  please arrive 10-15 min early to check in(PCP Juluis Mire Np) Follow up with [facility]; bariatric rollator, bariatric BSC  Open wound of back,  unspecified laterality, initial encounter    Leave open during the day and clean with antibacteria soap and bandage.If s/s of infection we discussed called immediately to ENDO or PCP  Follow up with Swinyer, Lanice Schwab, NP (Nurse Practitioner) on 09/08/2022; at 11:15  AM  please arrive 10-15 min early to check in Follow up with [facility]; bariatric rollator, bariatric BSC  Chronic midline low back pain without sciatica Back pain 9/10 lumbar region L1-L5 left side has numbness and tingling mid thigh- right- numb above knee  -     Ambulatory referral to Pain Clinic    No orders of the defined types were placed in this encounter.   This note has been created with Surveyor, quantity. Any transcriptional errors are unintentional.   [name], NP 09/10/2022, 9:55 AM

## 2022-09-20 DIAGNOSIS — Z79899 Other long term (current) drug therapy: Secondary | ICD-10-CM | POA: Diagnosis not present

## 2022-09-20 DIAGNOSIS — I10 Essential (primary) hypertension: Secondary | ICD-10-CM | POA: Diagnosis not present

## 2022-09-20 DIAGNOSIS — E7800 Pure hypercholesterolemia, unspecified: Secondary | ICD-10-CM | POA: Diagnosis not present

## 2022-09-20 DIAGNOSIS — E559 Vitamin D deficiency, unspecified: Secondary | ICD-10-CM | POA: Diagnosis not present

## 2022-09-20 DIAGNOSIS — I5032 Chronic diastolic (congestive) heart failure: Secondary | ICD-10-CM | POA: Diagnosis not present

## 2022-09-20 DIAGNOSIS — G8929 Other chronic pain: Secondary | ICD-10-CM | POA: Diagnosis not present

## 2022-09-20 DIAGNOSIS — E1169 Type 2 diabetes mellitus with other specified complication: Secondary | ICD-10-CM | POA: Diagnosis not present

## 2022-09-20 DIAGNOSIS — M549 Dorsalgia, unspecified: Secondary | ICD-10-CM | POA: Diagnosis not present

## 2022-09-20 DIAGNOSIS — M25511 Pain in right shoulder: Secondary | ICD-10-CM | POA: Diagnosis not present

## 2022-09-20 DIAGNOSIS — M129 Arthropathy, unspecified: Secondary | ICD-10-CM | POA: Diagnosis not present

## 2022-09-20 DIAGNOSIS — I252 Old myocardial infarction: Secondary | ICD-10-CM | POA: Diagnosis not present

## 2022-09-20 DIAGNOSIS — Z0130 Encounter for examination of blood pressure without abnormal findings: Secondary | ICD-10-CM | POA: Diagnosis not present

## 2022-09-20 DIAGNOSIS — Z6841 Body Mass Index (BMI) 40.0 and over, adult: Secondary | ICD-10-CM | POA: Diagnosis not present

## 2022-09-24 ENCOUNTER — Other Ambulatory Visit (INDEPENDENT_AMBULATORY_CARE_PROVIDER_SITE_OTHER): Payer: Self-pay | Admitting: Primary Care

## 2022-09-24 DIAGNOSIS — E118 Type 2 diabetes mellitus with unspecified complications: Secondary | ICD-10-CM

## 2022-09-25 MED ORDER — MELATONIN 10 MG PO TABS: 3.0000 | ORAL_TABLET | Freq: Every evening | ORAL | 1 refills | Status: DC | PRN

## 2022-09-25 MED ORDER — HYDRALAZINE HCL 100 MG PO TABS: 100.0000 mg | ORAL_TABLET | Freq: Three times a day (TID) | ORAL | 1 refills | Status: DC

## 2022-09-25 MED ORDER — GABAPENTIN 300 MG PO CAPS: 600.0000 mg | ORAL_CAPSULE | Freq: Three times a day (TID) | ORAL | 0 refills | Status: DC

## 2022-09-25 MED ORDER — METHOCARBAMOL 500 MG PO TABS: 750.0000 mg | ORAL_TABLET | Freq: Three times a day (TID) | ORAL | 0 refills | Status: DC | PRN

## 2022-09-27 ENCOUNTER — Other Ambulatory Visit (INDEPENDENT_AMBULATORY_CARE_PROVIDER_SITE_OTHER): Payer: Self-pay | Admitting: Primary Care

## 2022-09-27 DIAGNOSIS — R202 Paresthesia of skin: Secondary | ICD-10-CM

## 2022-09-27 DIAGNOSIS — Z760 Encounter for issue of repeat prescription: Secondary | ICD-10-CM

## 2022-09-28 ENCOUNTER — Ambulatory Visit: Payer: Medicaid Other | Admitting: Podiatry

## 2022-09-28 ENCOUNTER — Encounter: Payer: Self-pay | Admitting: Podiatry

## 2022-09-28 ENCOUNTER — Other Ambulatory Visit: Payer: Self-pay | Admitting: *Deleted

## 2022-09-28 DIAGNOSIS — M79674 Pain in right toe(s): Secondary | ICD-10-CM | POA: Diagnosis not present

## 2022-09-28 DIAGNOSIS — E1149 Type 2 diabetes mellitus with other diabetic neurological complication: Secondary | ICD-10-CM

## 2022-09-28 DIAGNOSIS — B351 Tinea unguium: Secondary | ICD-10-CM | POA: Diagnosis not present

## 2022-09-28 DIAGNOSIS — M79675 Pain in left toe(s): Secondary | ICD-10-CM

## 2022-09-28 MED ORDER — TORSEMIDE 20 MG PO TABS: 20.0000 mg | ORAL_TABLET | Freq: Two times a day (BID) | ORAL | 1 refills | Status: DC

## 2022-09-28 NOTE — Telephone Encounter (Signed)
Will forward to provider  

## 2022-09-29 DIAGNOSIS — H04123 Dry eye syndrome of bilateral lacrimal glands: Secondary | ICD-10-CM | POA: Diagnosis not present

## 2022-09-29 DIAGNOSIS — Z961 Presence of intraocular lens: Secondary | ICD-10-CM | POA: Diagnosis not present

## 2022-09-29 DIAGNOSIS — E119 Type 2 diabetes mellitus without complications: Secondary | ICD-10-CM | POA: Diagnosis not present

## 2022-10-02 ENCOUNTER — Other Ambulatory Visit (INDEPENDENT_AMBULATORY_CARE_PROVIDER_SITE_OTHER): Payer: Self-pay | Admitting: Primary Care

## 2022-10-02 DIAGNOSIS — Z760 Encounter for issue of repeat prescription: Secondary | ICD-10-CM

## 2022-10-02 DIAGNOSIS — R200 Anesthesia of skin: Secondary | ICD-10-CM

## 2022-10-04 NOTE — Progress Notes (Signed)
  Subjective:  Patient ID: Lindsay [name], female    DOB: 01-24-1981,  MRN: [id number]  Lindsay [name] presents to clinic today for:  Chief Complaint  Patient presents with   Nail Problem    Diabetic foot care BS-132 Last night A1C-9.6 PCP-Michelle Edwards PCP VST-09/10/22   New problem(s): None.   Allergies  Allergen Reactions   Acetaminophen Itching and Swelling    Itching of the mouth, swelling of tongue and stomach started hurting mild   Hydrazine Yellow [Tartrazine] Shortness Of Breath and Swelling    Swelling mostly noticed in legs and feet, retaining urination, shortness of breaht, and minor chest pain   Lisinopril Shortness Of Breath    Was on prinzide; had sob/chest pain on it.   Pineapple Itching   Review of Systems: Negative except as noted in the HPI.  Objective: No changes noted in today's physical examination.  Lindsay [name] is a pleasant 41 y.o. female morbidly obese in NAD. AAO x 3.  Neurovascular Examination: CFT <3 seconds b/l LE. Palpable DP/PT pulses b/l LE. Digital hair sparse b/l. Skin temperature gradient WNL b/l. No pain with calf compression b/l. No edema noted b/l. No cyanosis or clubbing noted b/l LE.  Protective sensation diminished with 10g monofilament b/l.  Dermatological:  Pedal integument with normal turgor, texture and tone b/l LE. No open wounds b/l. No interdigital macerations b/l. Toenails 1-5 b/l elongated, thickened, discolored with subungual debris. +Tenderness with dorsal palpation of nailplates. No hyperkeratotic or porokeratotic lesions present.  Musculoskeletal:  Muscle strength 5/5 to all lower extremity muscle groups bilaterally. No pain, crepitus or joint limitation noted with ROM bilateral LE. Pes planus deformity noted bilateral LE. Utilizes cane for ambulation assistance.  Assessment/Plan: 1. Pain due to onychomycosis of toenails of both feet   2. Type II diabetes mellitus with neurological manifestations (H[facility])      No orders of the defined types were placed in this encounter.   -Examined patient. -Continue diabetic foot care principles: inspect feet daily, monitor glucose as recommended by PCP and/or Endocrinologist, and follow prescribed diet per PCP, Endocrinologist and/or dietician. -Toenails 1-5 b/l were debrided in length and girth with sterile nail nippers and dremel without iatrogenic bleeding.  -Patient/POA to call should there be question/concern in the interim.   Return in about 3 months (around 12/29/2022).  Marzetta Board, DPM

## 2022-10-05 ENCOUNTER — Other Ambulatory Visit (INDEPENDENT_AMBULATORY_CARE_PROVIDER_SITE_OTHER): Payer: Self-pay | Admitting: Primary Care

## 2022-10-06 DIAGNOSIS — D508 Other iron deficiency anemias: Secondary | ICD-10-CM | POA: Diagnosis not present

## 2022-10-06 DIAGNOSIS — E559 Vitamin D deficiency, unspecified: Secondary | ICD-10-CM | POA: Diagnosis not present

## 2022-10-06 DIAGNOSIS — I252 Old myocardial infarction: Secondary | ICD-10-CM | POA: Diagnosis not present

## 2022-10-06 DIAGNOSIS — I10 Essential (primary) hypertension: Secondary | ICD-10-CM | POA: Diagnosis not present

## 2022-10-06 DIAGNOSIS — M129 Arthropathy, unspecified: Secondary | ICD-10-CM | POA: Diagnosis not present

## 2022-10-06 DIAGNOSIS — Z3200 Encounter for pregnancy test, result unknown: Secondary | ICD-10-CM | POA: Diagnosis not present

## 2022-10-06 DIAGNOSIS — E1169 Type 2 diabetes mellitus with other specified complication: Secondary | ICD-10-CM | POA: Diagnosis not present

## 2022-10-06 DIAGNOSIS — E7800 Pure hypercholesterolemia, unspecified: Secondary | ICD-10-CM | POA: Diagnosis not present

## 2022-10-06 DIAGNOSIS — Z794 Long term (current) use of insulin: Secondary | ICD-10-CM | POA: Diagnosis not present

## 2022-10-06 DIAGNOSIS — D539 Nutritional anemia, unspecified: Secondary | ICD-10-CM | POA: Diagnosis not present

## 2022-10-06 DIAGNOSIS — Z6841 Body Mass Index (BMI) 40.0 and over, adult: Secondary | ICD-10-CM | POA: Diagnosis not present

## 2022-10-06 DIAGNOSIS — Z79899 Other long term (current) drug therapy: Secondary | ICD-10-CM | POA: Diagnosis not present

## 2022-10-06 DIAGNOSIS — Z0130 Encounter for examination of blood pressure without abnormal findings: Secondary | ICD-10-CM | POA: Diagnosis not present

## 2022-10-12 DIAGNOSIS — Z79899 Other long term (current) drug therapy: Secondary | ICD-10-CM | POA: Diagnosis not present

## 2022-10-14 DIAGNOSIS — I5032 Chronic diastolic (congestive) heart failure: Secondary | ICD-10-CM | POA: Diagnosis not present

## 2022-10-14 DIAGNOSIS — Z79899 Other long term (current) drug therapy: Secondary | ICD-10-CM | POA: Diagnosis not present

## 2022-10-14 DIAGNOSIS — I252 Old myocardial infarction: Secondary | ICD-10-CM | POA: Diagnosis not present

## 2022-10-14 DIAGNOSIS — Z794 Long term (current) use of insulin: Secondary | ICD-10-CM | POA: Diagnosis not present

## 2022-10-14 DIAGNOSIS — Z6841 Body Mass Index (BMI) 40.0 and over, adult: Secondary | ICD-10-CM | POA: Diagnosis not present

## 2022-10-14 DIAGNOSIS — Z3200 Encounter for pregnancy test, result unknown: Secondary | ICD-10-CM | POA: Diagnosis not present

## 2022-10-14 DIAGNOSIS — E559 Vitamin D deficiency, unspecified: Secondary | ICD-10-CM | POA: Diagnosis not present

## 2022-10-14 DIAGNOSIS — Z0130 Encounter for examination of blood pressure without abnormal findings: Secondary | ICD-10-CM | POA: Diagnosis not present

## 2022-10-14 DIAGNOSIS — D539 Nutritional anemia, unspecified: Secondary | ICD-10-CM | POA: Diagnosis not present

## 2022-10-14 DIAGNOSIS — I10 Essential (primary) hypertension: Secondary | ICD-10-CM | POA: Diagnosis not present

## 2022-10-14 DIAGNOSIS — E1169 Type 2 diabetes mellitus with other specified complication: Secondary | ICD-10-CM | POA: Diagnosis not present

## 2022-10-14 DIAGNOSIS — E7800 Pure hypercholesterolemia, unspecified: Secondary | ICD-10-CM | POA: Diagnosis not present

## 2022-10-14 DIAGNOSIS — R030 Elevated blood-pressure reading, without diagnosis of hypertension: Secondary | ICD-10-CM | POA: Diagnosis not present

## 2022-10-16 ENCOUNTER — Other Ambulatory Visit (INDEPENDENT_AMBULATORY_CARE_PROVIDER_SITE_OTHER): Payer: Self-pay | Admitting: Primary Care

## 2022-10-16 DIAGNOSIS — Z760 Encounter for issue of repeat prescription: Secondary | ICD-10-CM

## 2022-10-19 DIAGNOSIS — Z79899 Other long term (current) drug therapy: Secondary | ICD-10-CM | POA: Diagnosis not present

## 2022-10-21 DIAGNOSIS — Z6841 Body Mass Index (BMI) 40.0 and over, adult: Secondary | ICD-10-CM | POA: Diagnosis not present

## 2022-10-21 DIAGNOSIS — I2510 Atherosclerotic heart disease of native coronary artery without angina pectoris: Secondary | ICD-10-CM | POA: Diagnosis not present

## 2022-10-21 DIAGNOSIS — E1165 Type 2 diabetes mellitus with hyperglycemia: Secondary | ICD-10-CM | POA: Diagnosis not present

## 2022-10-21 DIAGNOSIS — Z794 Long term (current) use of insulin: Secondary | ICD-10-CM | POA: Diagnosis not present

## 2022-10-22 NOTE — Telephone Encounter (Signed)
Copied from [place] [id number]. Topic: General - Other >> Oct 22, 2022  9:46 AM Sabas Sous wrote: Reason for CRM: Larene Beach from [place] wants the clinic to add to her most recent order for her ventilator that she is currently using and benefiting from the non invasive ventilator.  Best contact: [phone] ext. [place] from [facility]

## 2022-10-23 ENCOUNTER — Other Ambulatory Visit (INDEPENDENT_AMBULATORY_CARE_PROVIDER_SITE_OTHER): Payer: Self-pay | Admitting: Primary Care

## 2022-10-23 MED ORDER — HUMULIN R U-500 KWIKPEN 500 UNIT/ML ~~LOC~~ SOPN: 75.0000 [IU] | PEN_INJECTOR | Freq: Three times a day (TID) | SUBCUTANEOUS | 3 refills | Status: DC

## 2022-10-23 NOTE — Progress Notes (Deleted)
Cardiology Office Note:    Date:  10/23/2022   ID:  Lindsay [name], DOB 05-29-1981, MRN [id number]  PCP:  [name], NP   [facility] Providers Cardiologist:  Sinclair Grooms, MD { Click to update primary MD,subspecialty MD or APP then REFRESH:1}    Referring MD: [name], NP   Chief Complaint: ***  History of Present Illness:    Lindsay [name] is a *** 41 y.o. female with a hx of HTN, HLD, CAD s/p NSTEMI 08/2016 with DES to mLAD & POBA D2 (patent by cath 2021), HFpEF, morbid obesity, type 2 diabetes, and OSA on CPAP.  Presented with chest pain 08/2016. Anterior NSTEMI with total occlusion of mid LAD with right to left collaterals via the septal perforators and around the apex.  Successful PCI/DES to mLAD, successful PTCA of second diagonal ostium from 75% to 50% prestenting, 25 to 40% stenosis in proximal circumflex and 50% segmental stenosis in proximal first obtuse marginal.  Repeat cardiac cath 04/2020 revealed patent LAD stent, jailed second diagonal widely patent, proximal first diagonal contains ostial to proximal segmental 60% narrowing, normal left main, moderate irregularities in the circumflex up to 40% in the first marginal.   Admission 07/2022 with acute on chronic HFpEF with > 20 lb weight gain. Diuresed 16.2 L. Echo LVEF 70%, discharged on torsemide 40 mg bid.   Seen by Ermalinda Barrios, PA on 09/08/22 for follow-up. She did not have batteries in her scale at home and was not weighing on daily basis. Creatinine 1.28, referred to nephrology. Was advised to return in 6 months for follow-up.   Today, she is here   Past Medical History:  Diagnosis Date   ARF (acute renal failure) ([place]) 04/2015   Asthma    Cellulitis of right upper extremity    Coronary artery disease    Diabetes mellitus    insulin dependent   Hyperlipidemia LDL goal <70    Hypertension    Obesity    S/P angioplasty with stent 08/2016   DES to mLAD and PTCA only to 2nd diag  ostium.     Past Surgical History:  Procedure Laterality Date   CARDIAC CATHETERIZATION N/A 09/07/2016   Procedure: Left Heart Cath and Coronary Angiography;  Surgeon: [name], MD;  Location: [facility];  Service: Cardiovascular;  Laterality: N/A;   CARDIAC CATHETERIZATION N/A 09/07/2016   Procedure: Coronary Stent Intervention;  Surgeon: [name], MD;  Location: [place] [facility];  Service: Cardiovascular;  Laterality: N/A;   CARDIAC CATHETERIZATION N/A 09/07/2016   Procedure: Coronary Balloon Angioplasty;  Surgeon: [name], MD;  Location: [facility];  Service: Cardiovascular;  Laterality: N/A;   CESAREAN SECTION     IRRIGATION AND DEBRIDEMENT SHOULDER Right 04/30/2015   Procedure: IRRIGATION AND DEBRIDEMENT SHOULDER;  Surgeon: Renette Butters, MD;  Location: [facility];  Service: Orthopedics;  Laterality: Right;   IRRIGATION AND DEBRIDEMENT SHOULDER Right 05/01/2015   LEFT HEART CATH AND CORONARY ANGIOGRAPHY N/A 04/25/2020   Procedure: LEFT HEART CATH AND CORONARY ANGIOGRAPHY;  Surgeon: [name], MD;  Location: [facility] [place] [facility];  Service: Cardiovascular;  Laterality: N/A;   LEG SURGERY     SHOULDER ARTHROSCOPY Right 04/30/2015   Procedure: ARTHROSCOPY SHOULDER;  Surgeon: Renette Butters, MD;  Location: [facility];  Service: Orthopedics;  Laterality: Right;   TONSILLECTOMY      Current Medications: No outpatient medications have been marked as  taking for the 10/26/22 encounter (Appointment) with Lissa Hoard, Zachary George, NP.     Allergies:   Acetaminophen, Hydrazine yellow [tartrazine], Lisinopril, and Pineapple   Social History   Socioeconomic History   Marital status: Single    Spouse name: Not on file   Number of children: Not on file   Years of education: Not on file   Highest education level: Not on file  Occupational History   Occupation: disabled  Tobacco Use   Smoking status: Former    Packs/day: 0.25    Years: 2.00    Total pack years: 0.50     Types: Cigarettes    Quit date: 2008    Years since quitting: 15.8   Smokeless tobacco: Never  Vaping Use   Vaping Use: Never used  Substance and Sexual Activity   Alcohol use: Yes    Comment: socially   Drug use: No   Sexual activity: Not on file  Other Topics Concern   Not on file  Social History Narrative   Not on file   Social Determinants of Health   Financial Resource Strain: Low Risk  (10/22/2021)   Overall Financial Resource Strain (CARDIA)    Difficulty of Paying Living Expenses: Not hard at all  Food Insecurity: No Food Insecurity (10/22/2021)   Hunger Vital Sign    Worried About Running Out of Food in the Last Year: Never true    Ran Out of Food in the Last Year: Never true  Transportation Needs: No Transportation Needs (10/22/2021)   PRAPARE - Administrator, Civil Service (Medical): No    Lack of Transportation (Non-Medical): No  Physical Activity: Insufficiently Active (10/22/2021)   Exercise Vital Sign    Days of Exercise per Week: 3 days    Minutes of Exercise per Session: 20 min  Stress: Stress Concern Present (10/22/2021)   [employer] of Occupational Health - Occupational Stress Questionnaire    Feeling of Stress : To some extent  Social Connections: Unknown (10/22/2021)   Social Connection and Isolation Panel [NHANES]    Frequency of Communication with Friends and Family: More than three times a week    Frequency of Social Gatherings with Friends and Family: More than three times a week    Attends Religious Services: More than 4 times per year    Active Member of [employer] or Organizations: Yes    Attends Banker Meetings: 1 to 4 times per year    Marital Status: Not on file     Family History: The patient's ***family history includes Breast cancer in her cousin and cousin; Cancer in her maternal grandmother; Diabetes in her father and mother; Heart disease in her father and mother; Hypertension in her mother; Stroke in her  maternal grandmother.  ROS:   Please see the history of present illness.    *** All other systems reviewed and are negative.  Labs/Other Studies Reviewed:    The following studies were reviewed today:  Echo 08/15/2022  1. Technically difficult study, poor echo windows. Definity contrast  given   2. Left ventricular ejection fraction, by estimation, is 65 to 70%. Left  ventricular ejection fraction by PLAX is 65 %. The left ventricle has  normal function. The left ventricle has no regional wall motion  abnormalities. There is mild left ventricular  hypertrophy. Left ventricular diastolic parameters are indeterminate.   3. Right ventricular systolic function is normal. The right ventricular  size is normal.   4. The mitral  valve is abnormal. Trivial mitral valve regurgitation.   5. The aortic valve was not well visualized. Aortic valve regurgitation  is not visualized.   6. The inferior vena cava is normal in size with greater than 50%  respiratory variability, suggesting right atrial pressure of 3 mmHg.   Comparison(s): No significant change from prior study. 07/12/2021: LVEF  65-70%, mild LVH.    LHC 04/25/2020  LAD stent is widely patent with perhaps eccentric 20% narrowing in some views.  The jailed second diagonal is widely patent.  Proximal first diagonal contains ostial to proximal segmental 60% narrowing Normal left main Moderate irregularities in the circumflex up to 40% in the first marginal. Dominant right coronary, luminal irregularities. Normal LV function with EF 60%.  Normal LVEDP.   RECOMMENDATIONS:   Continue aggressive risk factor modification including greater than 150 minutes of moderate activity 5 out of 7 days of the week, weight loss, LDL less than 70, blood pressure control, and management of potential sleep apnea. Chest pain and dyspnea do not appear to be related to obstructive disease or heart failure respectively.  Other explanations should be  sought.   LHC 09/07/2016  The left ventricular ejection fraction is 35-45% by visual estimate. LV end diastolic pressure is moderately elevated. There is no mitral valve regurgitation.   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 This is a culprit for the patient's presentation and elevated troponins. Successful PCI and stent implantation reducing the 100% stenosis to 0% with TIMI grade 3 flow. Final postdilatation diameter 3.5 mm. Xience Alpine 3.0 x 18 mm DES was used. Successful PTCA of second diagonal ostium from 75% to 50% pre-stenting. 25-40% stenosis in the proximal circumflex, and 50% segmental stenosis in the proximal first obtuse marginal. Anteroapical akinesis. LVEDP 26 mmHg. Estimated EF 35-45%.   Recommendations:   Aggressive risk factor modification including high-intensity statin therapy, aggressive glycemic control, weight reduction, blood pressure control, low-fat carbohydrate modified diet, and exercise. Eligible for discharge in 24 hours assuming no complications. IV heparin was discontinued. Aspirin and Brilinta 6-12 months. Beta blocker therapy and ACE/ARB to treat what I believe will be reversible LV systolic dysfunction.    Recent Labs: 08/15/2022: B Natriuretic Peptide 77.3 08/23/2022: ALT 36; BUN 22; Creatinine, Ser 1.28; Hemoglobin 9.8; Magnesium 2.0; Platelets 293; Potassium 3.6; Sodium 139  Recent Lipid Panel    Component Value Date/Time   CHOL 131 03/05/2022 1619   TRIG 129 03/05/2022 1619   HDL 42 03/05/2022 1619   CHOLHDL 3.1 03/05/2022 1619   CHOLHDL 3.4 07/23/2021 0559   VLDL 12 07/23/2021 0559   LDLCALC 66 03/05/2022 1619     Risk Assessment/Calculations:   {Does this patient have ATRIAL FIBRILLATION?:[phone]}       Physical Exam:    VS:  There were no vitals taken for this visit.    Wt Readings from Last 3 Encounters:  09/10/22 (!) 428 lb 9.6 oz (194.4 kg)  09/08/22  (!) 428 lb (194.1 kg)  08/23/22 (!) 426 lb 11.2 oz (193.5 kg)     GEN: *** Well nourished, well developed in no acute distress HEENT: Normal NECK: No JVD; No carotid bruits CARDIAC: ***RRR, no murmurs, rubs, gallops RESPIRATORY:  Clear to auscultation without rales, wheezing or rhonchi  ABDOMEN: Soft, non-tender, non-distended MUSCULOSKELETAL:  No edema; No deformity. *** pedal pulses, ***bilaterally SKIN: Warm and dry NEUROLOGIC:  Alert and oriented x 3 PSYCHIATRIC:  Normal affect  EKG:  EKG is *** ordered today.  The ekg ordered today demonstrates ***  No BP recorded.  {Refresh Note OR Click here to enter BP  :1}***    Diagnoses:    No diagnosis found. Assessment and Plan:     Chronic HFpEF: CAD: Hypertension: Obesity:  {Are you ordering a CV Procedure (e.g. stress test, cath, DCCV, TEE, etc)?   Press F2        :[id number]}   Disposition:  Medication Adjustments/Labs and Tests Ordered: Current medicines are reviewed at length with the patient today.  Concerns regarding medicines are outlined above.  No orders of the defined types were placed in this encounter.  No orders of the defined types were placed in this encounter.   There are no Patient Instructions on file for this visit.   Signed, Levi Aland, NP  10/23/2022 5:53 AM    [facility]

## 2022-10-23 NOTE — Telephone Encounter (Signed)
Will forward to provider  

## 2022-10-26 ENCOUNTER — Ambulatory Visit: Payer: Medicaid Other | Admitting: Nurse Practitioner

## 2022-11-02 ENCOUNTER — Encounter: Payer: Medicaid Other | Attending: Internal Medicine | Admitting: Dietician

## 2022-11-02 ENCOUNTER — Encounter: Payer: Self-pay | Admitting: Dietician

## 2022-11-02 VITALS — Ht 67.5 in | Wt >= 6400 oz

## 2022-11-02 DIAGNOSIS — E118 Type 2 diabetes mellitus with unspecified complications: Secondary | ICD-10-CM | POA: Diagnosis not present

## 2022-11-02 NOTE — Progress Notes (Signed)
Diabetes Self-Management Education  Visit Type: First/Initial  Appt. Start Time: 1100 Appt. End Time: 1230  11/03/2022  [name], identified by name and date of birth, is a 41 y.o. female with a diagnosis of Diabetes: Type 2.   ASSESSMENT Patient is here today alone.  She was last seen by myself in 2018.  Referral Type 2 Diabetes, obesity History includes HTN, hyperlipidemia, and a history of MI with stent 08/2016, light sensitivity since cataracts were removed, HTN, CKD, sciatica  Labs:  A1C 10% 08/15/2022 increased from 8.3% 07/11/2021, GFR 54 08/23/2022 Medications:  Humulin 4 U-500 (80 before breakfast, 75 before lunch and dinner and 5 more units if glucose is above 200 (takes half the dose of insulin if her blood glucose is high and is going to skip the meal, Ozempic 1 mg, potassium, toursemide Dexcom G6 (did not bring her reader).  States that the high alarm has not gone off for the past month.  Weight hx: 414 lbs 11/02/2022 (BMI 63) 418 lbs  10/09/2022 lost weight by changing her eating habits and taking diuretic  460 lbs 12/2021   406 lbs 2018 330 lbs 2017  Patient lives with her 70 yo daughter.  They share shopping and cooking.  Her daughter also has weight concerns and patient states that they are on a healthy journey together. She gets food stamps and child support.  She is not working.  Disability was denied once and is applying for this again. She struggles with sciatica and is using a cane. "I need honesty for people to tell things as they are."   She reports increased anxiety from worry and stress lately.  She is going to fill out the application to see if they can go to the [facility]. Uses medical transportation, bus or friends. She needs to lower her A1C to <8% and BMI<55 to qualify for bariatric surgery. Currently doing PT exercises to improve range of motion. ALLERGY:  pineapple and yellow #5  Height 5' 7.5" (1.715 m), weight (!) 414 lb (187.8 kg). Body mass  index is 63.88 kg/m.   Diabetes Self-Management Education - 11/02/22 1145       Visit Information   Visit Type First/Initial      Initial Visit   Diabetes Type Type 2    Date Diagnosed 2004    Are you currently following a meal plan? Yes    What type of meal plan do you follow? for diabetes    Are you taking your medications as prescribed? Yes      Health Coping   How would you rate your overall health? Fair      Psychosocial Assessment   Patient Belief/Attitude about Diabetes Motivated to manage diabetes    What is the hardest part about your diabetes right now, causing you the most concern, or is the most worrisome to you about your diabetes?   Being active    Self-care barriers None    Self-management support Doctor's office    Other persons present Patient    Patient Concerns Nutrition/Meal planning;Weight Control;Glycemic Control;Healthy Lifestyle    Special Needs None    Preferred Learning Style No preference indicated    Learning Readiness Ready    How often do you need to have someone help you when you read instructions, pamphlets, or other written materials from your doctor or pharmacy? 1 - Never    What is the last grade level you completed in school? 1 year college  Pre-Education Assessment   Patient understands the diabetes disease and treatment process. Demonstrates understanding / competency    Patient understands incorporating nutritional management into lifestyle. Needs Review    Patient undertands incorporating physical activity into lifestyle. Needs Review    Patient understands using medications safely. Demonstrates understanding / competency    Patient understands monitoring blood glucose, interpreting and using results Needs Review    Patient understands prevention, detection, and treatment of acute complications. Demonstrates understanding / competency    Patient understands prevention, detection, and treatment of chronic complications. Demonstrates  understanding / competency    Patient understands how to develop strategies to address psychosocial issues. Demonstrates understanding / competency    Patient understands how to develop strategies to promote health/change behavior. Demonstrates understanding / competency      Complications   Last HgB A1C per patient/outside source 10 %   07/26/2022 increased from 8.3% 07/11/2021   How often do you check your blood sugar? > 4 times/day    Fasting Blood glucose range (mg/dL) 70-129;130-179;180-200;>200   95-235 (forgot insulin the night before when high)   Postprandial Blood glucose range (mg/dL) 70-129;130-179;180-200;>200   112-305   Number of hypoglycemic episodes per month 6    Can you tell when your blood sugar is low? Yes   58 is lowest   What do you do if your blood sugar is low? eats a granola bar    Number of hyperglycemic episodes ( >200mg /dL): Weekly    Can you tell when your blood sugar is high? Yes    What do you do if your blood sugar is high? gives extra insulin    Have you had a dilated eye exam in the past 12 months? Yes    Have you had a dental exam in the past 12 months? Yes    Are you checking your feet? Yes    How many days per week are you checking your feet? 3      Dietary Intake   Breakfast occasional - grits and eggs OR cereal (special K with almonds) 2% milk OR fruit OR regular oatmeal (2 cups oatmeal with 1 1/2 tsp butter and 1 T brown sugar)    Snack (morning) none    Lunch skips frequently OR [place] or bologna sandwich on [employer] OR leftovers    Snack (afternoon) granola bar if blood glucose is low    Dinner 2 hamburger's on buns, lettuce, tomatoes, ketchup, mustard, cheese, french fries (baked)    Snack (evening) none usually    Beverage(s) water, occasional hot tea with sugar or honey (1T), occasional coffee with regular hazel nut creamer, occasional regular soda, diet soda      Activity / Exercise   Activity / Exercise Type Light (walking / raking leaves)    How  many days per week do you exercise? 3    How many minutes per day do you exercise? 15    Total minutes per week of exercise 45      Patient Education   Previous Diabetes Education Yes (please comment)   2018   Disease Pathophysiology Definition of diabetes, type 1 and 2, and the diagnosis of diabetes    Healthy Eating Role of diet in the treatment of diabetes and the relationship between the three main macronutrients and blood glucose level;Plate Method;Meal options for control of blood glucose level and chronic complications.    Being Active Role of exercise on diabetes management, blood pressure control and cardiac health.;Helped patient identify  appropriate exercises in relation to his/her diabetes, diabetes complications and other health issue.    Medications Reviewed patients medication for diabetes, action, purpose, timing of dose and side effects.    Monitoring Identified appropriate SMBG and/or A1C goals.    Acute complications Taught prevention, symptoms, and  treatment of hypoglycemia - the 15 rule.    Chronic complications Relationship between chronic complications and blood glucose control;Retinopathy and reason for yearly dilated eye exams    Diabetes Stress and Support Identified and addressed patients feelings and concerns about diabetes;Worked with patient to identify barriers to care and solutions;Role of stress on diabetes;Helped patient identify a support system for diabetes management      Individualized Goals (developed by patient)   Nutrition General guidelines for healthy choices and portions discussed    Physical Activity Exercise 3-5 times per week;15 minutes per day    Medications take my medication as prescribed    Monitoring  Test my blood glucose as discussed    Problem Solving Eating Pattern      Post-Education Assessment   Patient understands the diabetes disease and treatment process. Comprehends key points    Patient understands incorporating nutritional  management into lifestyle. Needs Review    Patient undertands incorporating physical activity into lifestyle. Comprehends key points    Patient understands using medications safely. Comphrehends key points    Patient understands monitoring blood glucose, interpreting and using results Comprehends key points    Patient understands prevention, detection, and treatment of acute complications. Comprehends key points    Patient understands prevention, detection, and treatment of chronic complications. Comprehends key points    Patient understands how to develop strategies to address psychosocial issues. Comprehends key points    Patient understands how to develop strategies to promote health/change behavior. Demonstrates understanding / competency      Outcomes   Expected Outcomes Demonstrated interest in learning but significant barriers to change    Future DMSE 2 months             Individualized Plan for Diabetes Self-Management Training:   Learning Objective:  Patient will have a greater understanding of diabetes self-management. Patient education plan is to attend individual and/or group sessions per assessed needs and concerns.   Plan:   Patient Instructions  Aim for Breakfast, Lunch, and Dinner most days.  Breakfast ideas:  [place] yogurt and fruit  2 eggs, 1 cup grits  1 cup oatmeal, fruit, Greek yogurt or egg Lunch ideas:  Sandwich ([place] and cheese on [employer] bread), raw vegetables, fresh fruit  Homemade soup, salad, fresh fruit  Large salad with [place] or egg or cheese, fresh fruit  Leftovers Dinner:    Think of My Plate portions - half your plate as non-starchy vegetables  Drink water rather than any sugar drink.  Aim to be active most days.  Eat more Non-Starchy Vegetables These include greens, broccoli, cauliflower, cabbage, carrots, beets, eggplant, peppers, squash and others. Minimize added sugars and refined grains Rethink what you drink.  Choose beverages  without added sugar.  Look for 0 carbs on the label. See the list of whole grains below.  Find alternatives to usual sweet treats. Choose whole foods over processed. Make simple meals at home more often than eating out.  Tips to increase fiber in your diet: (All plants have fiber.  Eat a variety. There are more than are on this list.) Slowly increase the amount of fiber you eat to 25-35 grams per day.  (More is fine if  you tolerate it.) Fiber from whole grains, nuts and seeds Quinoa, 1/2 cup = 5 grams Bulgur, 1/2 cup = 4.1 grams Popcorn, 3 cups = 3.6 grams Whole Wheat Spaghetti, 1/2 cup = 3.2 grams Barley, 1/2 cup = 3 grams Oatmeal, 1/2 cup = 2 grams Whole Wheat English Muffin = 3 grams Corn, 1/2 cup = 2.1 grams Brown Rice, 1/2 cup = 1.8 grams Flax seeds, 1 Tablespoon = 2.8 grams Chia seeds, 1 Tablespoon = 11 grams Almonds, 1 ounce = 3.5 grams fiber Fiber from legumes Kidney beans, 1/2 cup 7.9 grams Lentils, 1/2 cup = 7.8 grams Pinto beans, 1/2 cup = 7.7 grams Black beans, 1/2 cup = 7.6 grams Lima beans, 1/2 cup 6.4 grams Chick peas, 1/2 cup = 5.3 grams Black eyed peas, 1/2 cup = 4 grams Fiber from fruits and vegetables Pear, 6 grams Apple. 3.3 grams Raspberries or Blackberries, 3/4 cup = 6 grams Strawberries or Blueberries, 1 cup = 3.4 grams Baked sweet potato 3.8 grams fiber Baked potato with skin 4.4 grams  Peas, 1/2 cup = 4.4 grams  Spinach, 1/2 cup cooked = 3.5 grams  Avocado, 1/2 = 5 grams      Expected Outcomes:  Demonstrated interest in learning but significant barriers to change  Education material provided: ADA - How to Thrive: A Guide for Your Journey with Diabetes, Meal plan card, Snack sheet, and Diabetes Resources, ACLM [employer] of Lifestyle Medicine) packet  If problems or questions, patient to contact team via:  Phone  Future DSME appointment: 2 months

## 2022-11-02 NOTE — Patient Instructions (Signed)
Aim for Breakfast, Lunch, and Dinner most days.  Breakfast ideas:  [place] yogurt and fruit  2 eggs, 1 cup grits  1 cup oatmeal, fruit, Greek yogurt or egg Lunch ideas:  Sandwich ([place] and cheese on [employer] bread), raw vegetables, fresh fruit  Homemade soup, salad, fresh fruit  Large salad with [place] or egg or cheese, fresh fruit  Leftovers Dinner:    Think of My Plate portions - half your plate as non-starchy vegetables  Drink water rather than any sugar drink.  Aim to be active most days.  Eat more Non-Starchy Vegetables These include greens, broccoli, cauliflower, cabbage, carrots, beets, eggplant, peppers, squash and others. Minimize added sugars and refined grains Rethink what you drink.  Choose beverages without added sugar.  Look for 0 carbs on the label. See the list of whole grains below.  Find alternatives to usual sweet treats. Choose whole foods over processed. Make simple meals at home more often than eating out.  Tips to increase fiber in your diet: (All plants have fiber.  Eat a variety. There are more than are on this list.) Slowly increase the amount of fiber you eat to 25-35 grams per day.  (More is fine if you tolerate it.) Fiber from whole grains, nuts and seeds Quinoa, 1/2 cup = 5 grams Bulgur, 1/2 cup = 4.1 grams Popcorn, 3 cups = 3.6 grams Whole Wheat Spaghetti, 1/2 cup = 3.2 grams Barley, 1/2 cup = 3 grams Oatmeal, 1/2 cup = 2 grams Whole Wheat English Muffin = 3 grams Corn, 1/2 cup = 2.1 grams Brown Rice, 1/2 cup = 1.8 grams Flax seeds, 1 Tablespoon = 2.8 grams Chia seeds, 1 Tablespoon = 11 grams Almonds, 1 ounce = 3.5 grams fiber Fiber from legumes Kidney beans, 1/2 cup 7.9 grams Lentils, 1/2 cup = 7.8 grams Pinto beans, 1/2 cup = 7.7 grams Black beans, 1/2 cup = 7.6 grams Lima beans, 1/2 cup 6.4 grams Chick peas, 1/2 cup = 5.3 grams Black eyed peas, 1/2 cup = 4 grams Fiber from fruits and vegetables Pear, 6 grams Apple. 3.3 grams Raspberries  or Blackberries, 3/4 cup = 6 grams Strawberries or Blueberries, 1 cup = 3.4 grams Baked sweet potato 3.8 grams fiber Baked potato with skin 4.4 grams  Peas, 1/2 cup = 4.4 grams  Spinach, 1/2 cup cooked = 3.5 grams  Avocado, 1/2 = 5 grams

## 2022-11-06 ENCOUNTER — Telehealth (INDEPENDENT_AMBULATORY_CARE_PROVIDER_SITE_OTHER): Payer: Self-pay | Admitting: Primary Care

## 2022-11-06 NOTE — Telephone Encounter (Signed)
Copied from CRM [id number]. Topic: General - Other >> Nov 05, 2022  1:55 PM Everette C wrote: Reason for CRM: The patient has been directed to contact their PCP and request a certificate of medical necessity for coverage of their ventilator and it's supplies  The patient would like to be contacted when the CMN is completed   Please contact the patient further if needed

## 2022-11-06 NOTE — Telephone Encounter (Signed)
Contacted pt and made aware that adapt will need to fax [place] a CMN . Pt states she understands and doesn't have any questions or concerns

## 2022-11-09 NOTE — Progress Notes (Deleted)
Cardiology Office Note:    Date:  11/09/2022   ID:  [name], DOB [birth year], MRN [id number]  PCP:  Grayce Sessions, NP   [facility] Providers Cardiologist:  Lesleigh Noe, MD { Click to update primary MD,subspecialty MD or APP then REFRESH:1}    Referring MD: Grayce Sessions, NP   Chief Complaint: ***  History of Present Illness:    Lindsay [name] is a *** 41 y.o. female with a hx of HTN, HLD, CAD s/p NSTEMI 08/2016 with DES to mLAD & POBA D2 (patent by cath 2021), HFpEF, morbid obesity, type 2 diabetes, and OSA on CPAP.  Presented with chest pain 08/2016. Anterior NSTEMI with total occlusion of mid LAD with right to left collaterals via the septal perforators and around the apex.  Successful PCI/DES to mLAD, successful PTCA of second diagonal ostium from 75% to 50% prestenting, 25 to 40% stenosis in proximal circumflex and 50% segmental stenosis in proximal first obtuse marginal.  Repeat cardiac cath 04/2020 revealed patent LAD stent, jailed second diagonal widely patent, proximal first diagonal contains ostial to proximal segmental 60% narrowing, normal left main, moderate irregularities in the circumflex up to 40% in the first marginal.   Admission 07/2022 with acute on chronic HFpEF with > 20 lb weight gain. Diuresed 16.2 L. Echo LVEF 70%, discharged on torsemide 40 mg bid.   Seen by Jacolyn Reedy, PA on 09/08/22 for follow-up. She did not have batteries in her scale at home and was not weighing on daily basis. Creatinine 1.28, referred to nephrology. Was advised to return in 6 months for follow-up.   Today, she is here   Past Medical History:  Diagnosis Date   ARF (acute renal failure) (HCC) 04/2015   Asthma    Cellulitis of right upper extremity    Coronary artery disease    Diabetes mellitus    insulin dependent   Hyperlipidemia LDL goal <70    Hypertension    Obesity    S/P angioplasty with stent 08/2016   DES to mLAD and PTCA only to 2nd diag  ostium.     Past Surgical History:  Procedure Laterality Date   CARDIAC CATHETERIZATION N/A 09/07/2016   Procedure: Left Heart Cath and Coronary Angiography;  Surgeon: Lyn Records, MD;  Location: [facility] INVASIVE CV LAB;  Service: Cardiovascular;  Laterality: N/A;   CARDIAC CATHETERIZATION N/A 09/07/2016   Procedure: Coronary Stent Intervention;  Surgeon: Lyn Records, MD;  Location: [facility] INVASIVE CV LAB;  Service: Cardiovascular;  Laterality: N/A;   CARDIAC CATHETERIZATION N/A 09/07/2016   Procedure: Coronary Balloon Angioplasty;  Surgeon: Lyn Records, MD;  Location: [facility] INVASIVE CV LAB;  Service: Cardiovascular;  Laterality: N/A;   CESAREAN SECTION     IRRIGATION AND DEBRIDEMENT SHOULDER Right 04/30/2015   Procedure: IRRIGATION AND DEBRIDEMENT SHOULDER;  Surgeon: Sheral Apley, MD;  Location: [facility] OR;  Service: Orthopedics;  Laterality: Right;   IRRIGATION AND DEBRIDEMENT SHOULDER Right 05/01/2015   LEFT HEART CATH AND CORONARY ANGIOGRAPHY N/A 04/25/2020   Procedure: LEFT HEART CATH AND CORONARY ANGIOGRAPHY;  Surgeon: Lyn Records, MD;  Location: [facility] INVASIVE CV LAB;  Service: Cardiovascular;  Laterality: N/A;   LEG SURGERY     SHOULDER ARTHROSCOPY Right 04/30/2015   Procedure: ARTHROSCOPY SHOULDER;  Surgeon: Sheral Apley, MD;  Location: [facility] OR;  Service: Orthopedics;  Laterality: Right;   TONSILLECTOMY      Current Medications: No outpatient medications have been marked as  taking for the 11/11/22 encounter (Appointment) with Lissa Hoard, Zachary George, NP.     Allergies:   Acetaminophen, Hydrazine yellow [fd&c yellow #5 (tartrazine)], Lisinopril, and Pineapple   Social History   Socioeconomic History   Marital status: Single    Spouse name: Not on file   Number of children: Not on file   Years of education: Not on file   Highest education level: Not on file  Occupational History   Occupation: disabled  Tobacco Use   Smoking status: Former    Packs/day: 0.25    Years: 2.00    Total  pack years: 0.50    Types: Cigarettes    Quit date: 2008    Years since quitting: 15.8   Smokeless tobacco: Never  Vaping Use   Vaping Use: Never used  Substance and Sexual Activity   Alcohol use: Yes    Comment: socially   Drug use: No   Sexual activity: Not on file  Other Topics Concern   Not on file  Social History Narrative   Not on file   Social Determinants of Health   Financial Resource Strain: Low Risk  (10/22/2021)   Overall Financial Resource Strain (CARDIA)    Difficulty of Paying Living Expenses: Not hard at all  Food Insecurity: No Food Insecurity (10/22/2021)   Hunger Vital Sign    Worried About Running Out of Food in the Last Year: Never true    Ran Out of Food in the Last Year: Never true  Transportation Needs: No Transportation Needs (10/22/2021)   PRAPARE - Administrator, Civil Service (Medical): No    Lack of Transportation (Non-Medical): No  Physical Activity: Insufficiently Active (10/22/2021)   Exercise Vital Sign    Days of Exercise per Week: 3 days    Minutes of Exercise per Session: 20 min  Stress: Stress Concern Present (10/22/2021)   [employer] of Occupational Health - Occupational Stress Questionnaire    Feeling of Stress : To some extent  Social Connections: Unknown (10/22/2021)   Social Connection and Isolation Panel [NHANES]    Frequency of Communication with Friends and Family: More than three times a week    Frequency of Social Gatherings with Friends and Family: More than three times a week    Attends Religious Services: More than 4 times per year    Active Member of [employer] or Organizations: Yes    Attends Banker Meetings: 1 to 4 times per year    Marital Status: Not on file     Family History: The patient's ***family history includes Breast cancer in her cousin and cousin; Cancer in her maternal grandmother; Diabetes in her father and mother; Heart disease in her father and mother; Hypertension in her mother;  Stroke in her maternal grandmother.  ROS:   Please see the history of present illness.    *** All other systems reviewed and are negative.  Labs/Other Studies Reviewed:    The following studies were reviewed today:  Echo 08/15/2022  1. Technically difficult study, poor echo windows. Definity contrast  given   2. Left ventricular ejection fraction, by estimation, is 65 to 70%. Left  ventricular ejection fraction by PLAX is 65 %. The left ventricle has  normal function. The left ventricle has no regional wall motion  abnormalities. There is mild left ventricular  hypertrophy. Left ventricular diastolic parameters are indeterminate.   3. Right ventricular systolic function is normal. The right ventricular  size is normal.  4. The mitral valve is abnormal. Trivial mitral valve regurgitation.   5. The aortic valve was not well visualized. Aortic valve regurgitation  is not visualized.   6. The inferior vena cava is normal in size with greater than 50%  respiratory variability, suggesting right atrial pressure of 3 mmHg.   Comparison(s): No significant change from prior study. 07/12/2021: LVEF  65-70%, mild LVH.    LHC 04/25/2020  LAD stent is widely patent with perhaps eccentric 20% narrowing in some views.  The jailed second diagonal is widely patent.  Proximal first diagonal contains ostial to proximal segmental 60% narrowing Normal left main Moderate irregularities in the circumflex up to 40% in the first marginal. Dominant right coronary, luminal irregularities. Normal LV function with EF 60%.  Normal LVEDP.   RECOMMENDATIONS:   Continue aggressive risk factor modification including greater than 150 minutes of moderate activity 5 out of 7 days of the week, weight loss, LDL less than 70, blood pressure control, and management of potential sleep apnea. Chest pain and dyspnea do not appear to be related to obstructive disease or heart failure respectively.  Other explanations  should be sought.   LHC 09/07/2016  The left ventricular ejection fraction is 35-45% by visual estimate. LV end diastolic pressure is moderately elevated. There is no mitral valve regurgitation.   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 This is a culprit for the patient's presentation and elevated troponins. Successful PCI and stent implantation reducing the 100% stenosis to 0% with TIMI grade 3 flow. Final postdilatation diameter 3.5 mm. Xience Alpine 3.0 x 18 mm DES was used. Successful PTCA of second diagonal ostium from 75% to 50% pre-stenting. 25-40% stenosis in the proximal circumflex, and 50% segmental stenosis in the proximal first obtuse marginal. Anteroapical akinesis. LVEDP 26 mmHg. Estimated EF 35-45%.   Recommendations:   Aggressive risk factor modification including high-intensity statin therapy, aggressive glycemic control, weight reduction, blood pressure control, low-fat carbohydrate modified diet, and exercise. Eligible for discharge in 24 hours assuming no complications. IV heparin was discontinued. Aspirin and Brilinta 6-12 months. Beta blocker therapy and ACE/ARB to treat what I believe will be reversible LV systolic dysfunction.    Recent Labs: 08/15/2022: B Natriuretic Peptide 77.3 08/23/2022: ALT 36; BUN 22; Creatinine, Ser 1.28; Hemoglobin 9.8; Magnesium 2.0; Platelets 293; Potassium 3.6; Sodium 139  Recent Lipid Panel    Component Value Date/Time   CHOL 131 03/05/2022 1619   TRIG 129 03/05/2022 1619   HDL 42 03/05/2022 1619   CHOLHDL 3.1 03/05/2022 1619   CHOLHDL 3.4 07/23/2021 0559   VLDL 12 07/23/2021 0559   LDLCALC 66 03/05/2022 1619     Risk Assessment/Calculations:   {Does this patient have ATRIAL FIBRILLATION?:[phone]}       Physical Exam:    VS:  There were no vitals taken for this visit.    Wt Readings from Last 3 Encounters:  11/02/22 (!) 414 lb (187.8 kg)   09/10/22 (!) 428 lb 9.6 oz (194.4 kg)  09/08/22 (!) 428 lb (194.1 kg)     GEN: *** Well nourished, well developed in no acute distress HEENT: Normal NECK: No JVD; No carotid bruits CARDIAC: ***RRR, no murmurs, rubs, gallops RESPIRATORY:  Clear to auscultation without rales, wheezing or rhonchi  ABDOMEN: Soft, non-tender, non-distended MUSCULOSKELETAL:  No edema; No deformity. *** pedal pulses, ***bilaterally SKIN: Warm and dry NEUROLOGIC:  Alert and oriented x 3 PSYCHIATRIC:  Normal  affect   EKG:  EKG is *** ordered today.  The ekg ordered today demonstrates ***  No BP recorded.  {Refresh Note OR Click here to enter BP  :1}***    Diagnoses:    No diagnosis found. Assessment and Plan:     Chronic HFpEF: CAD: Hypertension: Obesity:  {Are you ordering a CV Procedure (e.g. stress test, cath, DCCV, TEE, etc)?   Press F2        :[id number]}   Disposition:  Medication Adjustments/Labs and Tests Ordered: Current medicines are reviewed at length with the patient today.  Concerns regarding medicines are outlined above.  No orders of the defined types were placed in this encounter.  No orders of the defined types were placed in this encounter.   There are no Patient Instructions on file for this visit.   Signed, Levi Aland, NP  11/09/2022 7:45 PM    [facility]

## 2022-11-10 ENCOUNTER — Telehealth: Payer: Self-pay | Admitting: *Deleted

## 2022-11-10 NOTE — Telephone Encounter (Signed)
S/w pt is willing to move appt to 11:45 am.  Pt moved.

## 2022-11-11 ENCOUNTER — Ambulatory Visit: Payer: Medicaid Other | Admitting: Nurse Practitioner

## 2022-11-14 ENCOUNTER — Other Ambulatory Visit (INDEPENDENT_AMBULATORY_CARE_PROVIDER_SITE_OTHER): Payer: Self-pay | Admitting: Primary Care

## 2022-11-14 DIAGNOSIS — E118 Type 2 diabetes mellitus with unspecified complications: Secondary | ICD-10-CM

## 2022-11-16 NOTE — Progress Notes (Deleted)
Cardiology Office Note:    Date:  11/16/2022   ID:  Lindsay [name], DOB 10/26/1981, MRN [id number]  PCP:  Grayce Sessions, NP  [facility] Providers Cardiologist:  Lesleigh Noe, MD { Click to update primary MD,subspecialty MD or APP then REFRESH:1}  *** Referring MD: Grayce Sessions, NP   Chief Complaint:  No chief complaint on file. {Click here for Visit Info    :1}    History of Present Illness:   Lindsay [name] is a 41 y.o. female with a hx of DM, HTN, HLD, NSTEMI 08/2016 s/p DES mLAD & POBA D2 (patent at cath 2021), HFp/rEF, super morbid obesity.   Patient admitted 07/2022 with acute on chronic diastolic CHF > 20 lb weight gain. Diuresed 16.2L Echo LVEF 70% sent home on demadex 40 mg bid.      I saw the patient 08/2022 and BP up, was not weighing herself.      Past Medical History:  Diagnosis Date   ARF (acute renal failure) (HCC) 04/2015   Asthma    Cellulitis of right upper extremity    Coronary artery disease    Diabetes mellitus    insulin dependent   Hyperlipidemia LDL goal <70    Hypertension    Obesity    S/P angioplasty with stent 08/2016   DES to mLAD and PTCA only to 2nd diag ostium.    Current Medications: No outpatient medications have been marked as taking for the 11/17/22 encounter (Appointment) with Dyann Kief, PA-C.    Allergies:   Acetaminophen, Hydrazine yellow [fd&c yellow #5 (tartrazine)], Lisinopril, and Pineapple   Social History   Tobacco Use   Smoking status: Former    Packs/day: 0.25    Years: 2.00    Total pack years: 0.50    Types: Cigarettes    Quit date: 2008    Years since quitting: 15.9   Smokeless tobacco: Never  Vaping Use   Vaping Use: Never used  Substance Use Topics   Alcohol use: Yes    Comment: socially   Drug use: No    Family Hx: The patient's family history includes Breast cancer in her cousin and cousin; Cancer in her maternal grandmother; Diabetes in her father and mother;  Heart disease in her father and mother; Hypertension in her mother; Stroke in her maternal grandmother.  ROS     Physical Exam:    VS:  There were no vitals taken for this visit.    Wt Readings from Last 3 Encounters:  11/02/22 (!) 414 lb (187.8 kg)  09/10/22 (!) 428 lb 9.6 oz (194.4 kg)  09/08/22 (!) 428 lb (194.1 kg)    Physical Exam  GEN: Well nourished, well developed, in no acute distress  HEENT: normal  Neck: no JVD, carotid bruits, or masses Cardiac:RRR; no murmurs, rubs, or gallops  Respiratory:  clear to auscultation bilaterally, normal work of breathing GI: soft, nontender, nondistended, + BS Ext: without cyanosis, clubbing, or edema, Good distal pulses bilaterally MS: no deformity or atrophy  Skin: warm and dry, no rash Neuro:  Alert and Oriented x 3, Strength and sensation are intact Psych: euthymic mood, full affect        EKGs/Labs/Other Test Reviewed:    EKG:  EKG is *** ordered today.  The ekg ordered today demonstrates ***  Recent Labs: 08/15/2022: B Natriuretic Peptide 77.3 08/23/2022: ALT 36; BUN 22; Creatinine, Ser 1.28; Hemoglobin 9.8; Magnesium 2.0; Platelets 293; Potassium 3.6; Sodium  139   Recent Lipid Panel Recent Labs    03/05/22 1619  CHOL 131  TRIG 129  HDL 42  LDLCALC 66     Prior CV Studies: {Select studies to display:[id number]}   ECHO: 08/16/2022  1. Technically difficult study, poor echo windows. Definity contrast  given   2. Left ventricular ejection fraction, by estimation, is 65 to 70%. Left  ventricular ejection fraction by PLAX is 65 %. The left ventricle has  normal function. The left ventricle has no regional wall motion  abnormalities. There is mild left ventricular hypertrophy. Left ventricular diastolic parameters are indeterminate.   3. Right ventricular systolic function is normal. The right ventricular  size is normal.   4. The mitral valve is abnormal. Trivial mitral valve regurgitation.   5. The aortic valve was not  well visualized. Aortic valve regurgitation  is not visualized.   6. The inferior vena cava is normal in size with greater than 50%  respiratory variability, suggesting right atrial pressure of 3 mmHg.   Comparison(s): No significant change from prior study, 7/23/2022with a hx of DM, HTN, HLD, NSTEMI 08/2016 s/p DES mLAD & POBA D2 (patent at cath 2021), HFp/rEF, super morbid obesity    Risk Assessment/Calculations/Metrics:   {Does this patient have ATRIAL FIBRILLATION?[phone]}     No BP recorded.  {Refresh Note OR Click here to enter BP  :1}***    ASSESSMENT & PLAN:   No problem-specific Assessment & Plan notes found for this encounter.   Chronic diastolic CHF-recent hospitalization with 16.2 L diuresis. Stable today. Recent Crt 1.28 on demadex. Watching salt closely. She needs to weigh daily and keep up with BP   CAD NSTEMI 08/2016 DES mLAD & POBA D2 patent at cath 2021, no angina   Super morbid obesity BMI 66   HTN BP up initially, now normal   HLD managed by endocrine   DM2 A1C 9.6 managed by endocrine             {Are you ordering a CV Procedure (e.g. stress test, cath, DCCV, TEE, etc)?   Press F2        :[id number]}   Dispo:  No follow-ups on file.   Medication Adjustments/Labs and Tests Ordered: Current medicines are reviewed at length with the patient today.  Concerns regarding medicines are outlined above.  Tests Ordered: No orders of the defined types were placed in this encounter.  Medication Changes: No orders of the defined types were placed in this encounter.  Elson Clan, PA-C  11/16/2022 10:41 AM    [facility] [address] [place], [place], [place]  [address] Phone: [phone]; Fax: [phone]

## 2022-11-17 ENCOUNTER — Ambulatory Visit: Payer: Medicaid Other | Admitting: Physician Assistant

## 2022-11-17 DIAGNOSIS — E7800 Pure hypercholesterolemia, unspecified: Secondary | ICD-10-CM | POA: Diagnosis not present

## 2022-11-17 DIAGNOSIS — M19011 Primary osteoarthritis, right shoulder: Secondary | ICD-10-CM | POA: Diagnosis not present

## 2022-11-17 DIAGNOSIS — I252 Old myocardial infarction: Secondary | ICD-10-CM | POA: Diagnosis not present

## 2022-11-17 DIAGNOSIS — E1169 Type 2 diabetes mellitus with other specified complication: Secondary | ICD-10-CM | POA: Diagnosis not present

## 2022-11-17 DIAGNOSIS — Z0130 Encounter for examination of blood pressure without abnormal findings: Secondary | ICD-10-CM | POA: Diagnosis not present

## 2022-11-17 DIAGNOSIS — Z3200 Encounter for pregnancy test, result unknown: Secondary | ICD-10-CM | POA: Diagnosis not present

## 2022-11-17 DIAGNOSIS — I10 Essential (primary) hypertension: Secondary | ICD-10-CM | POA: Diagnosis not present

## 2022-11-17 DIAGNOSIS — R030 Elevated blood-pressure reading, without diagnosis of hypertension: Secondary | ICD-10-CM | POA: Diagnosis not present

## 2022-11-17 DIAGNOSIS — Z79899 Other long term (current) drug therapy: Secondary | ICD-10-CM | POA: Diagnosis not present

## 2022-11-17 DIAGNOSIS — E559 Vitamin D deficiency, unspecified: Secondary | ICD-10-CM | POA: Diagnosis not present

## 2022-11-17 DIAGNOSIS — M47816 Spondylosis without myelopathy or radiculopathy, lumbar region: Secondary | ICD-10-CM | POA: Diagnosis not present

## 2022-11-17 DIAGNOSIS — Z6841 Body Mass Index (BMI) 40.0 and over, adult: Secondary | ICD-10-CM | POA: Diagnosis not present

## 2022-11-17 DIAGNOSIS — I5032 Chronic diastolic (congestive) heart failure: Secondary | ICD-10-CM | POA: Diagnosis not present

## 2022-11-19 DIAGNOSIS — N1831 Chronic kidney disease, stage 3a: Secondary | ICD-10-CM | POA: Diagnosis not present

## 2022-11-19 DIAGNOSIS — I10 Essential (primary) hypertension: Secondary | ICD-10-CM | POA: Diagnosis not present

## 2022-11-19 DIAGNOSIS — E1122 Type 2 diabetes mellitus with diabetic chronic kidney disease: Secondary | ICD-10-CM | POA: Diagnosis not present

## 2022-11-19 DIAGNOSIS — N189 Chronic kidney disease, unspecified: Secondary | ICD-10-CM | POA: Diagnosis not present

## 2022-11-19 DIAGNOSIS — N179 Acute kidney failure, unspecified: Secondary | ICD-10-CM | POA: Diagnosis not present

## 2022-11-19 DIAGNOSIS — M899 Disorder of bone, unspecified: Secondary | ICD-10-CM | POA: Diagnosis not present

## 2022-11-19 DIAGNOSIS — Z794 Long term (current) use of insulin: Secondary | ICD-10-CM | POA: Diagnosis not present

## 2022-11-19 DIAGNOSIS — D649 Anemia, unspecified: Secondary | ICD-10-CM | POA: Diagnosis not present

## 2022-11-20 ENCOUNTER — Other Ambulatory Visit: Payer: Self-pay | Admitting: Interventional Cardiology

## 2022-11-20 DIAGNOSIS — I10 Essential (primary) hypertension: Secondary | ICD-10-CM

## 2022-11-20 DIAGNOSIS — Z79899 Other long term (current) drug therapy: Secondary | ICD-10-CM | POA: Diagnosis not present

## 2022-11-24 DIAGNOSIS — I10 Essential (primary) hypertension: Secondary | ICD-10-CM | POA: Diagnosis not present

## 2022-11-24 DIAGNOSIS — E1165 Type 2 diabetes mellitus with hyperglycemia: Secondary | ICD-10-CM | POA: Diagnosis not present

## 2022-11-24 DIAGNOSIS — Z794 Long term (current) use of insulin: Secondary | ICD-10-CM | POA: Diagnosis not present

## 2022-11-24 DIAGNOSIS — Z6841 Body Mass Index (BMI) 40.0 and over, adult: Secondary | ICD-10-CM | POA: Diagnosis not present

## 2022-12-01 DIAGNOSIS — R7989 Other specified abnormal findings of blood chemistry: Secondary | ICD-10-CM | POA: Diagnosis not present

## 2022-12-02 ENCOUNTER — Other Ambulatory Visit (HOSPITAL_COMMUNITY): Payer: Self-pay

## 2022-12-02 DIAGNOSIS — E1140 Type 2 diabetes mellitus with diabetic neuropathy, unspecified: Secondary | ICD-10-CM | POA: Diagnosis not present

## 2022-12-02 DIAGNOSIS — Z794 Long term (current) use of insulin: Secondary | ICD-10-CM | POA: Diagnosis not present

## 2022-12-03 ENCOUNTER — Other Ambulatory Visit (HOSPITAL_COMMUNITY): Payer: Self-pay

## 2022-12-03 ENCOUNTER — Other Ambulatory Visit: Payer: Self-pay

## 2022-12-04 ENCOUNTER — Other Ambulatory Visit (HOSPITAL_COMMUNITY): Payer: Self-pay

## 2022-12-07 ENCOUNTER — Other Ambulatory Visit (INDEPENDENT_AMBULATORY_CARE_PROVIDER_SITE_OTHER): Payer: Self-pay | Admitting: Primary Care

## 2022-12-07 MED ORDER — METHOCARBAMOL 500 MG PO TABS: 500.0000 mg | ORAL_TABLET | Freq: Three times a day (TID) | ORAL | 0 refills | Status: DC | PRN

## 2022-12-08 DIAGNOSIS — E559 Vitamin D deficiency, unspecified: Secondary | ICD-10-CM | POA: Diagnosis not present

## 2022-12-08 DIAGNOSIS — M19011 Primary osteoarthritis, right shoulder: Secondary | ICD-10-CM | POA: Diagnosis not present

## 2022-12-08 DIAGNOSIS — Z3200 Encounter for pregnancy test, result unknown: Secondary | ICD-10-CM | POA: Diagnosis not present

## 2022-12-08 DIAGNOSIS — M109 Gout, unspecified: Secondary | ICD-10-CM | POA: Diagnosis not present

## 2022-12-08 DIAGNOSIS — E1169 Type 2 diabetes mellitus with other specified complication: Secondary | ICD-10-CM | POA: Diagnosis not present

## 2022-12-08 DIAGNOSIS — M549 Dorsalgia, unspecified: Secondary | ICD-10-CM | POA: Diagnosis not present

## 2022-12-08 DIAGNOSIS — G8929 Other chronic pain: Secondary | ICD-10-CM | POA: Diagnosis not present

## 2022-12-08 DIAGNOSIS — Z79899 Other long term (current) drug therapy: Secondary | ICD-10-CM | POA: Diagnosis not present

## 2022-12-08 DIAGNOSIS — I10 Essential (primary) hypertension: Secondary | ICD-10-CM | POA: Diagnosis not present

## 2022-12-08 DIAGNOSIS — M25552 Pain in left hip: Secondary | ICD-10-CM | POA: Diagnosis not present

## 2022-12-08 DIAGNOSIS — M47816 Spondylosis without myelopathy or radiculopathy, lumbar region: Secondary | ICD-10-CM | POA: Diagnosis not present

## 2022-12-08 DIAGNOSIS — D508 Other iron deficiency anemias: Secondary | ICD-10-CM | POA: Diagnosis not present

## 2022-12-12 ENCOUNTER — Other Ambulatory Visit (INDEPENDENT_AMBULATORY_CARE_PROVIDER_SITE_OTHER): Payer: Self-pay | Admitting: Primary Care

## 2022-12-12 DIAGNOSIS — E118 Type 2 diabetes mellitus with unspecified complications: Secondary | ICD-10-CM

## 2023-01-08 DIAGNOSIS — Z3200 Encounter for pregnancy test, result unknown: Secondary | ICD-10-CM | POA: Diagnosis not present

## 2023-01-08 DIAGNOSIS — M19011 Primary osteoarthritis, right shoulder: Secondary | ICD-10-CM | POA: Diagnosis not present

## 2023-01-08 DIAGNOSIS — Z6841 Body Mass Index (BMI) 40.0 and over, adult: Secondary | ICD-10-CM | POA: Diagnosis not present

## 2023-01-08 DIAGNOSIS — M549 Dorsalgia, unspecified: Secondary | ICD-10-CM | POA: Diagnosis not present

## 2023-01-08 DIAGNOSIS — Z794 Long term (current) use of insulin: Secondary | ICD-10-CM | POA: Diagnosis not present

## 2023-01-08 DIAGNOSIS — I10 Essential (primary) hypertension: Secondary | ICD-10-CM | POA: Diagnosis not present

## 2023-01-08 DIAGNOSIS — E1169 Type 2 diabetes mellitus with other specified complication: Secondary | ICD-10-CM | POA: Diagnosis not present

## 2023-01-08 DIAGNOSIS — Z79899 Other long term (current) drug therapy: Secondary | ICD-10-CM | POA: Diagnosis not present

## 2023-01-08 DIAGNOSIS — M47816 Spondylosis without myelopathy or radiculopathy, lumbar region: Secondary | ICD-10-CM | POA: Diagnosis not present

## 2023-01-08 DIAGNOSIS — Z0130 Encounter for examination of blood pressure without abnormal findings: Secondary | ICD-10-CM | POA: Diagnosis not present

## 2023-01-08 DIAGNOSIS — E559 Vitamin D deficiency, unspecified: Secondary | ICD-10-CM | POA: Diagnosis not present

## 2023-01-08 DIAGNOSIS — D508 Other iron deficiency anemias: Secondary | ICD-10-CM | POA: Diagnosis not present

## 2023-01-08 DIAGNOSIS — M109 Gout, unspecified: Secondary | ICD-10-CM | POA: Diagnosis not present

## 2023-01-15 ENCOUNTER — Other Ambulatory Visit (INDEPENDENT_AMBULATORY_CARE_PROVIDER_SITE_OTHER): Payer: Self-pay | Admitting: Primary Care

## 2023-01-15 DIAGNOSIS — E118 Type 2 diabetes mellitus with unspecified complications: Secondary | ICD-10-CM

## 2023-01-15 DIAGNOSIS — Z794 Long term (current) use of insulin: Secondary | ICD-10-CM | POA: Diagnosis not present

## 2023-01-15 DIAGNOSIS — I10 Essential (primary) hypertension: Secondary | ICD-10-CM | POA: Diagnosis not present

## 2023-01-15 DIAGNOSIS — Z6841 Body Mass Index (BMI) 40.0 and over, adult: Secondary | ICD-10-CM | POA: Diagnosis not present

## 2023-01-15 DIAGNOSIS — I2510 Atherosclerotic heart disease of native coronary artery without angina pectoris: Secondary | ICD-10-CM | POA: Diagnosis not present

## 2023-01-15 DIAGNOSIS — E1165 Type 2 diabetes mellitus with hyperglycemia: Secondary | ICD-10-CM | POA: Diagnosis not present

## 2023-01-15 NOTE — Telephone Encounter (Signed)
Requested Prescriptions  Pending Prescriptions Disp Refills   gabapentin (NEURONTIN) 300 MG capsule [Pharmacy Med Name: GABAPENTIN 300MG  CAPSULES] 180 capsule 0    Sig: TAKE 2 CAPSULES(600 MG) BY MOUTH THREE TIMES DAILY     Neurology: Anticonvulsants - gabapentin Failed - 01/15/2023 11:21 AM      Failed - Cr in normal range and within 360 days    Creat  Date Value Ref Range Status  09/16/2016 0.71 0.50 - 1.10 mg/dL Final   Creatinine, Ser  Date Value Ref Range Status  08/23/2022 1.28 (H) 0.44 - 1.00 mg/dL Final   Creatinine,U  Date Value Ref Range Status  02/18/2017 57.2 mg/dL Final   Creatinine, Urine  Date Value Ref Range Status  07/14/2021 193.74 mg/dL Final    Comment:    Performed at [facility], [place] [address] [place], [place] [address]         Passed - Completed PHQ-2 or PHQ-9 in the last 360 days      Passed - Valid encounter within last 12 months    Recent Outpatient Visits           4 months ago Diabetes mellitus with complication [facility])   [place] [name], NP   10 months ago Annual physical exam   [place] [facility] [name], NP   1 year ago Uncomplicated asthma, unspecified asthma severity, unspecified whether persistent   [place] [facility] [name], NP   2 years ago Mixed hyperlipidemia   [place] [facility] [name], NP   2 years ago Mixed hyperlipidemia   [place] [facility] [name], NP       Future Appointments             In 1 month Chandrasekhar, Terisa Starr, MD [place] [facility], [place]   In 1 month Oletta Lamas, Milford Cage, NP [place]

## 2023-01-18 ENCOUNTER — Other Ambulatory Visit (INDEPENDENT_AMBULATORY_CARE_PROVIDER_SITE_OTHER): Payer: Self-pay | Admitting: Primary Care

## 2023-01-18 ENCOUNTER — Ambulatory Visit: Payer: Medicaid Other | Admitting: Dietician

## 2023-01-18 ENCOUNTER — Ambulatory Visit: Payer: Medicaid Other | Admitting: Podiatry

## 2023-01-18 VITALS — BP 134/70

## 2023-01-18 DIAGNOSIS — M2141 Flat foot [pes planus] (acquired), right foot: Secondary | ICD-10-CM | POA: Diagnosis not present

## 2023-01-18 DIAGNOSIS — E119 Type 2 diabetes mellitus without complications: Secondary | ICD-10-CM | POA: Diagnosis not present

## 2023-01-18 DIAGNOSIS — R200 Anesthesia of skin: Secondary | ICD-10-CM

## 2023-01-18 DIAGNOSIS — M79674 Pain in right toe(s): Secondary | ICD-10-CM | POA: Diagnosis not present

## 2023-01-18 DIAGNOSIS — B351 Tinea unguium: Secondary | ICD-10-CM

## 2023-01-18 DIAGNOSIS — M2142 Flat foot [pes planus] (acquired), left foot: Secondary | ICD-10-CM

## 2023-01-18 DIAGNOSIS — Z760 Encounter for issue of repeat prescription: Secondary | ICD-10-CM

## 2023-01-18 DIAGNOSIS — M79675 Pain in left toe(s): Secondary | ICD-10-CM

## 2023-01-18 DIAGNOSIS — E1142 Type 2 diabetes mellitus with diabetic polyneuropathy: Secondary | ICD-10-CM | POA: Diagnosis not present

## 2023-01-18 NOTE — Telephone Encounter (Signed)
Will forward to provider  

## 2023-01-19 ENCOUNTER — Encounter: Payer: Self-pay | Admitting: Podiatry

## 2023-01-19 NOTE — Progress Notes (Signed)
ANNUAL DIABETIC FOOT EXAM  Subjective: Lindsay [name] presents today for annual diabetic foot examination.  Chief Complaint  Patient presents with   Nail Problem    DFC BS-did not check today A1C-7.? PCP-Michelle Edwards PCP VST-08/2022    Patient confirms h/o diabetes.  Patient relates 20 year h/o diabetes.  Patient denies any h/o foot wounds.  Patient has been diagnosed with neuropathy and it is managed with gabapentin.  Risk factors: diabetes, diabetic neuropathy, chronic lower extremity edema, h/o MI, HTN, CAD, CHF, hyperlipidemia, h/o tobacco use in remission.  [name], NP is patient's PCP.  Past Medical History:  Diagnosis Date   ARF (acute renal failure) ([place]) 04/2015   Asthma    Cellulitis of right upper extremity    Coronary artery disease    Diabetes mellitus    insulin dependent   Hyperlipidemia LDL goal <70    Hypertension    Obesity    S/P angioplasty with stent 08/2016   DES to mLAD and PTCA only to 2nd diag ostium.    Patient Active Problem List   Diagnosis Date Noted   Shortness of breath 08/15/2022   Back pain 08/15/2022   Mood disorder ([place]) 08/15/2022   Pseudophakia of left eye 12/04/2021   Status post cataract extraction and insertion of intraocular lens of right eye 12/04/2021   Acute respiratory failure with hypoxia (HCC)    Acute on chronic diastolic heart failure (HCC)    CAP (community acquired pneumonia) 07/11/2021   Elevated liver enzymes 05/23/2020   Spondylosis of lumbar spine 06/22/2019   Pedal edema 06/22/2019   Hyperlipidemia 06/22/2019   Normocytic anemia 06/22/2019   Vitamin D deficiency 06/22/2019   Uncontrolled type 2 diabetes mellitus with hyperglycemia, with long-term current use of insulin ([place]) 08/31/2017   OSA (obstructive sleep apnea) 11/03/2016   Coronary artery disease involving native coronary artery of native heart with angina pectoris ([place]) 09/11/2016   Diabetes mellitus with complication (HCC)     Chest pain of uncertain etiology 58/52/7782   Community acquired pneumonia 09/07/2016   MI, old    DM neuropathy, type II diabetes mellitus ([place]) 06/10/2016   Morbid obesity ([place]) 06/10/2016   Pressure ulcer 05/17/2015   Anaerobic abscess (HCC)    AKI (acute kidney injury) ([place])    Septic arthritis ([place])    Essential hypertension 04/30/2015   Cellulitis 04/30/2015   Abscess of right shoulder    Past Surgical History:  Procedure Laterality Date   CARDIAC CATHETERIZATION N/A 09/07/2016   Procedure: Left Heart Cath and Coronary Angiography;  Surgeon: [name], MD;  Location: [facility];  Service: Cardiovascular;  Laterality: N/A;   CARDIAC CATHETERIZATION N/A 09/07/2016   Procedure: Coronary Stent Intervention;  Surgeon: [name], MD;  Location: [facility];  Service: Cardiovascular;  Laterality: N/A;   CARDIAC CATHETERIZATION N/A 09/07/2016   Procedure: Coronary Balloon Angioplasty;  Surgeon: [name], MD;  Location: [facility];  Service: Cardiovascular;  Laterality: N/A;   CESAREAN SECTION     IRRIGATION AND DEBRIDEMENT SHOULDER Right 04/30/2015   Procedure: IRRIGATION AND DEBRIDEMENT SHOULDER;  Surgeon: Renette Butters, MD;  Location: [facility];  Service: Orthopedics;  Laterality: Right;   IRRIGATION AND DEBRIDEMENT SHOULDER Right 05/01/2015   LEFT HEART CATH AND CORONARY ANGIOGRAPHY N/A 04/25/2020   Procedure: LEFT HEART CATH AND CORONARY ANGIOGRAPHY;  Surgeon: [name], MD;  Location: [facility];  Service: Cardiovascular;  Laterality: N/A;  LEG SURGERY     SHOULDER ARTHROSCOPY Right 04/30/2015   Procedure: ARTHROSCOPY SHOULDER;  Surgeon: Renette Butters, MD;  Location: [facility];  Service: Orthopedics;  Laterality: Right;   TONSILLECTOMY     Current Outpatient Medications on File Prior to Visit  Medication Sig Dispense Refill   oxyCODONE-acetaminophen (PERCOCET) 10-325 MG tablet Take 1 tablet by mouth 3 (three) times daily as needed.      Accu-Chek FastClix Lancets MISC USE 1 TO CHECK GLUCOSE 4 TIMES DAILY 102 each 2   acetaminophen (TYLENOL) 325 MG tablet Take 2 tablets (650 mg total) by mouth every 6 (six) hours as needed for mild pain, fever or headache. 30 tablet 0   albuterol (VENTOLIN HFA) 108 (90 Base) MCG/ACT inhaler Inhale 2 puffs into the lungs every 6 (six) hours as needed for wheezing or shortness of breath. 18 g 1   allopurinol (ZYLOPRIM) 100 MG tablet Take 100 mg by mouth daily.     aspirin 81 MG tablet Take 1 tablet (81 mg total) by mouth daily. 30 tablet 11   atorvastatin (LIPITOR) 20 MG tablet TAKE 1 TABLET(20 MG) BY MOUTH DAILY 90 tablet 3   BD PEN NEEDLE NANO 2ND GEN 32G X 4 MM MISC Use to inject into the skin in the morning, at noon, and at bedtime as directed. 100 each 11   BD VEO INSULIN SYRINGE U/F 31G X 15/64" 1 ML MISC USE  SYRINGE ONCE DAILY 100 each 1   Blood Glucose Monitoring Suppl (ACCU-CHEK AVIVA PLUS) w/Device KIT 1 each by Does not apply route as directed. 1 kit 0   Continuous Blood Gluc Transmit (DEXCOM G6 TRANSMITTER) MISC Inject into the skin.     diclofenac Sodium (VOLTAREN) 1 % GEL Apply 4 g topically 4 (four) times daily. (Patient taking differently: Apply 4 g topically 4 (four) times daily as needed (pain).) 350 g 1   DULoxetine (CYMBALTA) 30 MG capsule TAKE 1 CAPSULE(30 MG) BY MOUTH DAILY 90 capsule 1   fluticasone (FLOVENT HFA) 110 MCG/ACT inhaler Inhale 2 puffs into the lungs 2 (two) times daily. 1 each 12   gabapentin (NEURONTIN) 300 MG capsule TAKE 2 CAPSULES(600 MG) BY MOUTH THREE TIMES DAILY 180 capsule 0   glucose blood (ACCU-CHEK AVIVA PLUS) test strip Use as instructed to test blood sugar 4 times daily. 100 each 12   HUMULIN R U-500 KWIKPEN 500 UNIT/ML KwikPen Inject 75-80 Units into the skin 3 (three) times daily with meals. AM 80 units Noon 75 units Evening 75 units If BG above 200 add 5 units to prescribed dose 3 mL 3   hydrALAZINE (APRESOLINE) 100 MG tablet Take 1 tablet (100 mg  total) by mouth 3 (three) times daily. 120 tablet 1   Insulin Pen Needle (B-D UF III MINI PEN NEEDLES) 31G X 5 MM MISC Use as instructed. Monitor blood glucose levels three times per day 90 each 1   isosorbide mononitrate (IMDUR) 60 MG 24 hr tablet Take 1 tablet (60 mg total) by mouth daily. 30 tablet 1   Melatonin 10 MG TABS Take 3 tablets by mouth at bedtime as needed (sleep). 90 tablet 1   methocarbamol (ROBAXIN) 500 MG tablet Take 1 tablet (500 mg total) by mouth every 8 (eight) hours as needed for muscle spasms. 90 tablet 0   metoprolol tartrate (LOPRESSOR) 50 MG tablet Take 1 tablet (50 mg total) by mouth 2 (two) times daily. 60 tablet 1   Multiple Vitamins-Minerals (MULTIVITAMIN PO) Take 1 tablet  by mouth daily.     nitroGLYCERIN (NITROSTAT) 0.4 MG SL tablet Place 1 tablet (0.4 mg total) under the tongue every 5 (five) minutes as needed for chest pain. Please keep upcoming appt in October 2022. Final Attempt 25 tablet 2   OZEMPIC, 0.25 OR 0.5 MG/DOSE, 2 MG/3ML SOPN Inject 0.5 mg into the skin once a week. (Patient not taking: Reported on 11/02/2022)     potassium chloride SA (KLOR-CON) 20 MEQ tablet Take 1 tablet (20 mEq total) by mouth daily. 90 tablet 3   Semaglutide, 1 MG/DOSE, (OZEMPIC, 1 MG/DOSE,) 2 MG/1.5ML SOPN Inject 1 mg into the skin once a week.     sodium chloride (OCEAN) 0.65 % nasal spray Place 1 spray into the nose as needed for congestion.     torsemide (DEMADEX) 20 MG tablet Take 1 tablet (20 mg total) by mouth 2 (two) times daily. Dose Change: Take 2 tabs (40 mg total) twice a day. 360 tablet 1   No current facility-administered medications on file prior to visit.    Allergies  Allergen Reactions   Acetaminophen Itching and Swelling    Itching of the mouth, swelling of tongue and stomach started hurting mild   Hydrazine Yellow [Fd&C Yellow #5 (Tartrazine)] Shortness Of Breath and Swelling    Swelling mostly noticed in legs and feet, retaining urination, shortness of  breaht, and minor chest pain   Lisinopril Shortness Of Breath    Was on prinzide; had sob/chest pain on it.   Pineapple Itching   Social History   Occupational History   Occupation: disabled  Tobacco Use   Smoking status: Former    Packs/day: 0.25    Years: 2.00    Total pack years: 0.50    Types: Cigarettes    Quit date: 2008    Years since quitting: 16.0   Smokeless tobacco: Never  Vaping Use   Vaping Use: Never used  Substance and Sexual Activity   Alcohol use: Yes    Comment: socially   Drug use: No   Sexual activity: Not on file   Family History  Problem Relation Age of Onset   Diabetes Mother    Hypertension Mother    Heart disease Mother    Diabetes Father    Heart disease Father    Stroke Maternal Grandmother    Cancer Maternal Grandmother    Breast cancer Cousin        30's   Breast cancer Cousin        50's   Immunization History  Administered Date(s) Administered   PFIZER(Purple Top)SARS-COV-2 Vaccination 11/30/2020   Pneumococcal Polysaccharide-23 02/13/2015   Tdap 07/28/2016     Review of Systems: Negative except as noted in the HPI.   Objective: Vitals:   01/18/23 1040  BP: 134/70    Lindsay [name] is a pleasant 42 y.o. female in NAD. AAO X 3.  Vascular Examination: Capillary refill time immediate b/l. Vascular status intact b/l with palpable pedal pulses. Pedal hair sparse b/l. Trace edema. No pain with calf compression b/l. Skin temperature gradient WNL b/l. No cyanosis or clubbing noted b/l LE.  Neurological Examination:Pt has subjective symptoms of neuropathy. Protective sensation diminished with 10g monofilament b/l.  Dermatological Examination: Pedal skin with normal turgor, texture and tone b/l.  No open wounds. No interdigital macerations.   Toenails 1-5 b/l thick, discolored, elongated with subungual debris and pain on dorsal palpation.   Pedal skin is warm and supple b/l LE. No open wounds b/l  LE. No interdigital  macerations noted b/l LE. Toenails 1-5 b/l elongated, discolored, dystrophic, thickened, crumbly with subungual debris and tenderness to dorsal palpation. No hyperkeratotic nor porokeratotic lesions present on today's visit.  Musculoskeletal Examination: Muscle strength 5/5 to all lower extremity muscle groups bilaterally. No pain, crepitus or joint limitation noted with ROM bilateral LE. Pes planus deformity noted bilateral LE. Utilizes cane for ambulation assistance.  Radiographs: None  Last A1c:      Latest Ref Rng & Units 08/15/2022   10:36 AM  Hemoglobin A1C  Hemoglobin-A1c 4.8 - 5.6 % 10.0    Footwear Assessment: Does the patient wear appropriate shoes? Yes. Does the patient need inserts/orthotics? No.  Lab Results  Component Value Date   HGBA1C 10.0 (H) 08/15/2022   No results found. ADA Risk Categorization:  High Risk  Patient has one or more of the following: Loss of protective sensation Absent pedal pulses Severe Foot deformity History of foot ulcer  Assessment: 1. Pain due to onychomycosis of toenails of both feet   2. Pes planus of both feet   3. Diabetic peripheral neuropathy associated with type 2 diabetes mellitus ([place])   4. Encounter for diabetic foot exam [facility])     Plan:  -Patient was evaluated and treated. All patient's and/or POA's questions/concerns answered on today's visit. -Diabetic foot examination performed today. -Continue foot and shoe inspections daily. Monitor blood glucose per PCP/Endocrinologist's recommendations. -Continue supportive shoe gear daily. -Mycotic toenails 1-5 bilaterally were debrided in length and girth with sterile nail nippers and dremel without incident. -Patient/POA to call should there be question/concern in the interim. Return in about 3 months (around 04/19/2023).  Marzetta Board, DPM

## 2023-01-26 ENCOUNTER — Encounter: Payer: Medicaid Other | Attending: Internal Medicine | Admitting: Dietician

## 2023-01-26 ENCOUNTER — Encounter: Payer: Self-pay | Admitting: Dietician

## 2023-01-26 VITALS — Wt 399.0 lb

## 2023-01-26 DIAGNOSIS — E118 Type 2 diabetes mellitus with unspecified complications: Secondary | ICD-10-CM | POA: Insufficient documentation

## 2023-01-26 NOTE — Patient Instructions (Addendum)
    Breakfast ideas:             [place] yogurt and fruit             2 eggs, 1 cup grits             1 cup oatmeal, fruit, Greek yogurt or egg Lunch ideas:             Sandwich ([place] and cheese on [employer] bread), raw vegetables, fresh fruit             Homemade soup, salad, fresh fruit             Large salad with [place] or egg or cheese, fresh fruit             Leftovers Dinner:               Think of My Plate portions - half your plate as non-starchy vegetables   Drink water rather than any sugar containing drink.   Aim to be active most days.  What kind of a reward could you give yourself that is not food or drink?

## 2023-01-26 NOTE — Progress Notes (Signed)
Diabetes Self-Management Education  Visit Type: Follow-up  Appt. Start Time: 0915 Appt. End Time: 0945  01/26/2023  [name], identified by name and date of birth, is a 42 y.o. female with a diagnosis of Diabetes: Type 2.   ASSESSMENT Patient is here today alone.  She was last seen by this RD 11/02/2022.  Lack of sleep due to week old puppies. Continues to use the Dexcom. She is currently on a Bariatric program pathway through [facility] and will see a dietitian next month there. Decreased the portion and frequency of soda and cookies. She is drinking 2 regular soda daily. Decreasing red meat due to gout  Referral Type 2 Diabetes, obesity History includes HTN, hyperlipidemia, and a history of MI with stent 08/2016, light sensitivity since cataracts were removed, HTN, CKD, sciatica  Labs:  A1C 7.9% 12/02/2022 decreased from 10% 08/15/2022, GFR 54 08/23/2022 Medications:  Humulin  U-500 (80 before breakfast, 85 before lunch and dinner and 5 more units if glucose is above 200 (takes half the dose of insulin if her blood glucose is high and is going to skip the meal, Ozempic 1 mg, potassium, toursemide Dexcom G6 (did not bring her reader).  States that the high alarm has not gone off for the past month and low alarm has gone off a couple of times this month.   Weight hx: 399 lbs 01/26/2023 414 lbs 11/02/2022 (BMI 63) 418 lbs  10/09/2022 lost weight by changing her eating habits and taking diuretic  460 lbs 12/2021   406 lbs 2018 330 lbs 2017   Patient lives with her 39 yo daughter.  They share shopping and cooking.  Her daughter also has weight concerns and patient states that they are on a healthy journey together. She gets food stamps and child support.  She is not working.  Disability was denied once and is applying for this again. She struggles with sciatica and is using a cane. "I need honesty for people to tell things as they are."   She reports increased anxiety from worry  and stress lately.  She is going to fill out the application to see if they can go to the [facility]. Uses medical transportation, bus or friends. She needs to lower her A1C to <8% and BMI<55 to qualify for bariatric surgery. Previous PT to improve range of motion. ALLERGY:  pineapple and yellow #5 Skips meals regularly.  Avoids snacks. No salt or rinsed canned, frozen vegetables  Weight (!) 399 lb (181 kg). Body mass index is 61.57 kg/m.   Diabetes Self-Management Education - 01/26/23 1600       Visit Information   Visit Type Follow-up      Initial Visit   Diabetes Type Type 2      Pre-Education Assessment   Patient understands the diabetes disease and treatment process. Demonstrates understanding / competency    Patient understands incorporating nutritional management into lifestyle. Needs Review    Patient undertands incorporating physical activity into lifestyle. Needs Review    Patient understands using medications safely. Demonstrates understanding / competency    Patient understands monitoring blood glucose, interpreting and using results Needs Review    Patient understands prevention, detection, and treatment of acute complications. Demonstrates understanding / competency    Patient understands prevention, detection, and treatment of chronic complications. Demonstrates understanding / competency    Patient understands how to develop strategies to address psychosocial issues. Demonstrates understanding / competency    Patient understands how to develop strategies to  promote health/change behavior. Demonstrates understanding / competency      Complications   Last HgB A1C per patient/outside source 7.9 %   12/02/2022   How often do you check your blood sugar? > 4 times/day      Dietary Intake   Breakfast 1/2 honey bun    Snack (morning) none    Lunch skips    Snack (afternoon) none    Dinner spaghetti with marinara, 2 slices garlic bread    Snack (evening) none     Beverage(s) water, occasional juice 2 bottles of regular soda daily, occasional coffee or tea      Patient Education   Previous Diabetes Education Yes (please comment)   11/02/2022   Healthy Eating Meal options for control of blood glucose level and chronic complications.    Diabetes Stress and Support Worked with patient to identify barriers to care and solutions;Identified and addressed patients feelings and concerns about diabetes    Lifestyle and Health Coping Lifestyle issues that need to be addressed for better diabetes care      Individualized Goals (developed by patient)   Nutrition General guidelines for healthy choices and portions discussed    Physical Activity Exercise 3-5 times per week;15 minutes per day    Medications take my medication as prescribed    Monitoring  Consistenly use CGM    Problem Solving Eating Pattern      Post-Education Assessment   Patient understands the diabetes disease and treatment process. Comprehends key points    Patient understands incorporating nutritional management into lifestyle. Needs Review    Patient undertands incorporating physical activity into lifestyle. Comprehends key points    Patient understands using medications safely. Comphrehends key points    Patient understands monitoring blood glucose, interpreting and using results Comprehends key points    Patient understands prevention, detection, and treatment of acute complications. Comprehends key points    Patient understands prevention, detection, and treatment of chronic complications. Comprehends key points    Patient understands how to develop strategies to address psychosocial issues. Comprehends key points    Patient understands how to develop strategies to promote health/change behavior. Needs Review      Outcomes   Expected Outcomes Demonstrated interest in learning. Expect positive outcomes    Future DMSE 2 months             Individualized Plan for Diabetes  Self-Management Training:   Learning Objective:  Patient will have a greater understanding of diabetes self-management. Patient education plan is to attend individual and/or group sessions per assessed needs and concerns.   Plan:   Patient Instructions      Breakfast ideas:             Greek yogurt and fruit             2 eggs, 1 cup grits             1 cup oatmeal, fruit, Greek yogurt or egg Lunch ideas:             Sandwich ([place] and cheese on [employer] bread), raw vegetables, fresh fruit             Homemade soup, salad, fresh fruit             Large salad with [place] or egg or cheese, fresh fruit             Leftovers Dinner:  Think of My Plate portions - half your plate as non-starchy vegetables   Drink water rather than any sugar containing drink.   Aim to be active most days.  What kind of a reward could you give yourself that is not food or drink?  Expected Outcomes:  Demonstrated interest in learning. Expect positive outcomes  Education material provided:   If problems or questions, patient to contact team via:  Phone  Future DSME appointment: 2 months

## 2023-02-09 DIAGNOSIS — M549 Dorsalgia, unspecified: Secondary | ICD-10-CM | POA: Diagnosis not present

## 2023-02-09 DIAGNOSIS — I10 Essential (primary) hypertension: Secondary | ICD-10-CM | POA: Diagnosis not present

## 2023-02-09 DIAGNOSIS — Z794 Long term (current) use of insulin: Secondary | ICD-10-CM | POA: Diagnosis not present

## 2023-02-09 DIAGNOSIS — E1169 Type 2 diabetes mellitus with other specified complication: Secondary | ICD-10-CM | POA: Diagnosis not present

## 2023-02-09 DIAGNOSIS — Z3200 Encounter for pregnancy test, result unknown: Secondary | ICD-10-CM | POA: Diagnosis not present

## 2023-02-09 DIAGNOSIS — M47816 Spondylosis without myelopathy or radiculopathy, lumbar region: Secondary | ICD-10-CM | POA: Diagnosis not present

## 2023-02-09 DIAGNOSIS — D508 Other iron deficiency anemias: Secondary | ICD-10-CM | POA: Diagnosis not present

## 2023-02-09 DIAGNOSIS — M19011 Primary osteoarthritis, right shoulder: Secondary | ICD-10-CM | POA: Diagnosis not present

## 2023-02-09 DIAGNOSIS — Z6841 Body Mass Index (BMI) 40.0 and over, adult: Secondary | ICD-10-CM | POA: Diagnosis not present

## 2023-02-09 DIAGNOSIS — Z0130 Encounter for examination of blood pressure without abnormal findings: Secondary | ICD-10-CM | POA: Diagnosis not present

## 2023-02-09 DIAGNOSIS — Z79899 Other long term (current) drug therapy: Secondary | ICD-10-CM | POA: Diagnosis not present

## 2023-02-09 DIAGNOSIS — M109 Gout, unspecified: Secondary | ICD-10-CM | POA: Diagnosis not present

## 2023-02-15 DIAGNOSIS — Z713 Dietary counseling and surveillance: Secondary | ICD-10-CM | POA: Diagnosis not present

## 2023-02-16 DIAGNOSIS — Z79899 Other long term (current) drug therapy: Secondary | ICD-10-CM | POA: Diagnosis not present

## 2023-03-01 ENCOUNTER — Other Ambulatory Visit (HOSPITAL_COMMUNITY): Payer: Self-pay

## 2023-03-01 ENCOUNTER — Other Ambulatory Visit (INDEPENDENT_AMBULATORY_CARE_PROVIDER_SITE_OTHER): Payer: Self-pay | Admitting: Primary Care

## 2023-03-02 ENCOUNTER — Other Ambulatory Visit (HOSPITAL_COMMUNITY): Payer: Self-pay

## 2023-03-02 ENCOUNTER — Other Ambulatory Visit: Payer: Self-pay

## 2023-03-02 MED ORDER — BD PEN NEEDLE NANO 2ND GEN 32G X 4 MM MISC
1.0000 | Freq: Three times a day (TID) | 11 refills | Status: DC
  Filled 2023-03-02: qty 100, 30d supply, fill #0
  Filled 2023-04-05: qty 100, 30d supply, fill #1
  Filled 2023-08-30 (×2): qty 100, 30d supply, fill #2
  Filled 2023-12-17: qty 100, 30d supply, fill #3

## 2023-03-03 ENCOUNTER — Other Ambulatory Visit: Payer: Self-pay

## 2023-03-08 NOTE — Progress Notes (Unsigned)
Cardiology Office Note:    Date:  03/09/2023   ID:  Lindsay [name], DOB [birth year], MRN [id number]  PCP:  [name], NP   [place] Providers Cardiologist:  Werner Lean, MD     Referring MD: [name], NP   CC Transition to new cardiologist  History of Present Illness:    Lindsay [name] is a 42 y.o. female with a hx of CAD with prior mLAD PCI and D2 intervention, Super morbid obesity, HTN, DM, and OSA- CPAP.  Also AOCD. 2023: Saw APP and started therapy for HFrEF (had decompensated HF eval)  Patient notes that she is doing well- feels sleepy today.   There are no interval hospital/ED visit.    No chest pain or pressure .  No SOB/DOE and no PND/Orthopnea.  Has noticed some LE edema  more on active days.  No palpitations or syncope.  Has been out of the Imdur, losartan 100 mg, and the metoprolol.   Ambulatory blood pressure not done.   Past Medical History:  Diagnosis Date   ARF (acute renal failure) ([place]) 04/2015   Asthma    Cellulitis of right upper extremity    Coronary artery disease    Diabetes mellitus    insulin dependent   Hyperlipidemia LDL goal <70    Hypertension    Obesity    S/P angioplasty with stent 08/2016   DES to mLAD and PTCA only to 2nd diag ostium.     Past Surgical History:  Procedure Laterality Date   CARDIAC CATHETERIZATION N/A 09/07/2016   Procedure: Left Heart Cath and Coronary Angiography;  Surgeon: [name], MD;  Location: [facility];  Service: Cardiovascular;  Laterality: N/A;   CARDIAC CATHETERIZATION N/A 09/07/2016   Procedure: Coronary Stent Intervention;  Surgeon: [name], MD;  Location: [facility];  Service: Cardiovascular;  Laterality: N/A;   CARDIAC CATHETERIZATION N/A 09/07/2016   Procedure: Coronary Balloon Angioplasty;  Surgeon: [name], MD;  Location: [facility];  Service: Cardiovascular;  Laterality: N/A;   CESAREAN SECTION     IRRIGATION  AND DEBRIDEMENT SHOULDER Right 04/30/2015   Procedure: IRRIGATION AND DEBRIDEMENT SHOULDER;  Surgeon: Renette Butters, MD;  Location: [facility];  Service: Orthopedics;  Laterality: Right;   IRRIGATION AND DEBRIDEMENT SHOULDER Right 05/01/2015   LEFT HEART CATH AND CORONARY ANGIOGRAPHY N/A 04/25/2020   Procedure: LEFT HEART CATH AND CORONARY ANGIOGRAPHY;  Surgeon: [name], MD;  Location: [facility];  Service: Cardiovascular;  Laterality: N/A;   LEG SURGERY     SHOULDER ARTHROSCOPY Right 04/30/2015   Procedure: ARTHROSCOPY SHOULDER;  Surgeon: Renette Butters, MD;  Location: [facility];  Service: Orthopedics;  Laterality: Right;   TONSILLECTOMY      Current Medications: Current Meds  Medication Sig   Accu-Chek FastClix Lancets MISC USE 1 TO CHECK GLUCOSE 4 TIMES DAILY   acetaminophen (TYLENOL) 325 MG tablet Take 2 tablets (650 mg total) by mouth every 6 (six) hours as needed for mild pain, fever or headache.   albuterol (VENTOLIN HFA) 108 (90 Base) MCG/ACT inhaler Inhale 2 puffs into the lungs every 6 (six) hours as needed for wheezing or shortness of breath.   allopurinol (ZYLOPRIM) 100 MG tablet Take 100 mg by mouth daily.   aspirin 81 MG tablet Take 1 tablet (81 mg total) by mouth daily.   atorvastatin (LIPITOR) 20 MG tablet TAKE 1 TABLET(20 MG) BY MOUTH DAILY  BD PEN NEEDLE NANO 2ND GEN 32G X 4 MM MISC Use to inject into the skin in the morning, at noon, and at bedtime as directed.   BD VEO INSULIN SYRINGE U/F 31G X 15/64" 1 ML MISC USE  SYRINGE ONCE DAILY   Blood Glucose Monitoring Suppl (ACCU-CHEK AVIVA PLUS) w/Device KIT 1 each by Does not apply route as directed.   Continuous Blood Gluc Transmit (DEXCOM G6 TRANSMITTER) MISC Inject into the skin.   diclofenac Sodium (VOLTAREN) 1 % GEL Apply 4 g topically 4 (four) times daily. (Patient taking differently: Apply 4 g topically 4 (four) times daily as needed (pain).)   DULoxetine (CYMBALTA) 30 MG capsule TAKE 1 CAPSULE(30 MG) BY MOUTH  DAILY   fluticasone (FLOVENT HFA) 110 MCG/ACT inhaler Inhale 2 puffs into the lungs 2 (two) times daily.   gabapentin (NEURONTIN) 300 MG capsule TAKE 2 CAPSULES(600 MG) BY MOUTH THREE TIMES DAILY   glucose blood (ACCU-CHEK AVIVA PLUS) test strip Use as instructed to test blood sugar 4 times daily.   HUMULIN R U-500 KWIKPEN 500 UNIT/ML KwikPen Inject 75-80 Units into the skin 3 (three) times daily with meals. AM 80 units Noon 75 units Evening 75 units If BG above 200 add 5 units to prescribed dose   hydrALAZINE (APRESOLINE) 100 MG tablet TAKE 1 TABLET(100 MG) BY MOUTH THREE TIMES DAILY   Insulin Pen Needle (B-D UF III MINI PEN NEEDLES) 31G X 5 MM MISC Use as instructed. Monitor blood glucose levels three times per day   losartan (COZAAR) 100 MG tablet Take 1 tablet (100 mg total) by mouth daily.   Melatonin 10 MG TABS Take 3 tablets by mouth at bedtime as needed (sleep).   methocarbamol (ROBAXIN) 500 MG tablet Take 1 tablet (500 mg total) by mouth every 8 (eight) hours as needed for muscle spasms.   Multiple Vitamins-Minerals (MULTIVITAMIN PO) Take 1 tablet by mouth daily.   naloxone (NARCAN) nasal spray 4 mg/0.1 mL Place into both nostrils as needed (over dose).   nitroGLYCERIN (NITROSTAT) 0.4 MG SL tablet Place 1 tablet (0.4 mg total) under the tongue every 5 (five) minutes as needed for chest pain. Please keep upcoming appt in October 2022. Final Attempt   oxyCODONE-acetaminophen (PERCOCET) 10-325 MG tablet Take 1 tablet by mouth 3 (three) times daily as needed.   potassium chloride SA (KLOR-CON) 20 MEQ tablet Take 1 tablet (20 mEq total) by mouth daily.   Semaglutide, 1 MG/DOSE, (OZEMPIC, 1 MG/DOSE,) 2 MG/1.5ML SOPN Inject 1 mg into the skin once a week.   sodium chloride (OCEAN) 0.65 % nasal spray Place 1 spray into the nose as needed for congestion.   torsemide (DEMADEX) 20 MG tablet Take 1 tablet (20 mg total) by mouth 2 (two) times daily. Dose Change: Take 2 tabs (40 mg total) twice a day.    Vitamin D, Ergocalciferol, (DRISDOL) 1.25 MG (50000 UNIT) CAPS capsule Take 50,000 Units by mouth once a week.   [DISCONTINUED] isosorbide mononitrate (IMDUR) 60 MG 24 hr tablet Take 1 tablet (60 mg total) by mouth daily.   [DISCONTINUED] metoprolol tartrate (LOPRESSOR) 50 MG tablet Take 1 tablet (50 mg total) by mouth 2 (two) times daily.   [DISCONTINUED] OZEMPIC, 0.25 OR 0.5 MG/DOSE, 2 MG/3ML SOPN Inject 1 mg into the skin once a week.     Allergies:   Acetaminophen, Hydrazine yellow [fd&c yellow #5 (tartrazine)], Lisinopril, and Pineapple   Social History   Socioeconomic History   Marital status: Single  Spouse name: Not on file   Number of children: Not on file   Years of education: Not on file   Highest education level: Not on file  Occupational History   Occupation: disabled  Tobacco Use   Smoking status: Former    Packs/day: 0.25    Years: 2.00    Additional pack years: 0.00    Total pack years: 0.50    Types: Cigarettes    Quit date: 2008    Years since quitting: 16.2   Smokeless tobacco: Never  Vaping Use   Vaping Use: Never used  Substance and Sexual Activity   Alcohol use: Yes    Comment: socially   Drug use: No   Sexual activity: Not on file  Other Topics Concern   Not on file  Social History Narrative   Not on file   Social Determinants of Health   Financial Resource Strain: Low Risk  (10/22/2021)   Overall Financial Resource Strain (CARDIA)    Difficulty of Paying Living Expenses: Not hard at all  Food Insecurity: No Food Insecurity (10/22/2021)   Hunger Vital Sign    Worried About Running Out of Food in the Last Year: Never true    [place] in the Last Year: Never true  Transportation Needs: No Transportation Needs (10/22/2021)   PRAPARE - Hydrologist (Medical): No    Lack of Transportation (Non-Medical): No  Physical Activity: Insufficiently Active (10/22/2021)   Exercise Vital Sign    Days of Exercise per Week:  3 days    Minutes of Exercise per Session: 20 min  Stress: Stress Concern Present (10/22/2021)   [place]    Feeling of Stress : To some extent  Social Connections: Unknown (10/22/2021)   Social Connection and Isolation Panel [NHANES]    Frequency of Communication with Friends and Family: More than three times a week    Frequency of Social Gatherings with Friends and Family: More than three times a week    Attends Religious Services: More than 4 times per year    Active Member of [employer] or Organizations: Yes    Attends Archivist Meetings: 1 to 4 times per year    Marital Status: Not on file     Family History: The patient's family history includes Breast cancer in her cousin and cousin; Cancer in her maternal grandmother; Diabetes in her father and mother; Heart disease in her father and mother; Hypertension in her mother; Stroke in her maternal grandmother.  ROS:   Please see the history of present illness.     All other systems reviewed and are negative.  EKGs/Labs/Other Studies Reviewed:    The following studies were reviewed today:  EKG:   08/13/2022: Sinus tachycardia low voltage  Cardiac Studies & Procedures   CARDIAC CATHETERIZATION  CARDIAC CATHETERIZATION 04/25/2020  Narrative  LAD stent is widely patent with perhaps eccentric 20% narrowing in some views.  The jailed second diagonal is widely patent.  Proximal first diagonal contains ostial to proximal segmental 60% narrowing  Normal left main  Moderate irregularities in the circumflex up to 40% in the first marginal.  Dominant right coronary, luminal irregularities.  Normal LV function with EF 60%.  Normal LVEDP.  RECOMMENDATIONS:   Continue aggressive risk factor modification including greater than 150 minutes of moderate activity 5 out of 7 days of the week, weight loss, LDL less than 70, blood pressure control,  and management of  potential sleep apnea.  Chest pain and dyspnea do not appear to be related to obstructive disease or heart failure respectively.  Other explanations should be sought.  Findings Coronary Findings Diagnostic  Dominance: Right  Left Anterior Descending Mid LAD lesion is 20% stenosed. Vessel is the culprit lesion. The lesion is type C. The lesion was previously treated.  First Diagonal Branch 1st Diag lesion is 60% stenosed.  First Septal Branch Vessel is small in size.  Second Diagonal [employer] 2nd Diag lesion is 50% stenosed. The lesion is type C and eccentric. The lesion was previously treated.  Left Circumflex Prox Cx to Dist Cx lesion is 30% stenosed.  First Obtuse Marginal Branch Ost 1st Mrg to 1st Mrg lesion is 50% stenosed. 1st Mrg lesion is 50% stenosed.  Right Coronary Artery The vessel exhibits minimal luminal irregularities.  Intervention  No interventions have been documented.   CARDIAC CATHETERIZATION  CARDIAC CATHETERIZATION 09/07/2016  Narrative  The left ventricular ejection fraction is 35-45% by visual estimate.  LV end diastolic pressure is moderately elevated.  There is no mitral valve regurgitation.   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 This is a culprit for the patient's presentation and elevated troponins.  Successful PCI and stent implantation reducing the 100% stenosis to 0% with TIMI grade 3 flow. Final postdilatation diameter 3.5 mm. Xience Alpine 3.0 x 18 mm DES was used.  Successful PTCA of second diagonal ostium from 75% to 50% pre-stenting.  25-40% stenosis in the proximal circumflex, and 50% segmental stenosis in the proximal first obtuse marginal.  Anteroapical akinesis. LVEDP 26 mmHg. Estimated EF 35-45%.  Recommendations:   Aggressive risk factor modification including high-intensity statin therapy, aggressive glycemic control, weight reduction, blood  pressure control, low-fat carbohydrate modified diet, and exercise.  Eligible for discharge in 24 hours assuming no complications.  IV heparin was discontinued.  Aspirin and Brilinta 6-12 months.  Beta blocker therapy and ACE/ARB to treat what I believe will be reversible LV systolic dysfunction.  Findings Coronary Findings Diagnostic  Dominance: Right  Left Anterior Descending Culprit lesion. The lesion is type C.  Second Diagonal Branch The lesion is type C and eccentric.  Left Circumflex  First Obtuse Marginal Branch  Intervention  Mid LAD to Dist LAD lesion Angioplasty Lesion crossed with guidewire using a WIRE ASAHI PROWATER 180CM. Pre-stent angioplasty was performed using a BALLOON EMERGE MR 2.5X12. A STENT XIENCE ALPINE RX 3.0X18 drug eluting stent was successfully placed. Stent strut is well apposed. Post-stent angioplasty was performed using a BALLOON [place] RX3.5X8. Maximum pressure: 14 atm. There is no pre-interventional antegrade distal flow (TIMI 0).  The post-interventional distal flow is normal (TIMI 3). The intervention was successful . No complications occurred at this lesion. There is a 0% residual stenosis post intervention.  Ost 2nd Diag lesion Angioplasty Lesion crossed with guidewire using a WIRE HI TORQ BMW 190CM. Angioplasty alone was performed using a BALLOON EMERGE MR 2.5X12. The pre-interventional distal flow is normal (TIMI 3).  The post-interventional distal flow is normal (TIMI 3). The intervention was successful . No complications occurred at this lesion. There is a 50% residual stenosis post intervention.     ECHOCARDIOGRAM  ECHOCARDIOGRAM COMPLETE 08/15/2022  Narrative ECHOCARDIOGRAM REPORT    Patient Name:   PSALMS CROSSWELL Date of Exam: 08/15/2022 Medical Rec #:  [id number]         Height:  67.0 in Accession #:    [id number]        Weight:       420.0 lb Date of Birth:  [birth year]         BSA:          2.772 m Patient Age:     63 years          BP:           156/73 mmHg Patient Gender: F                 HR:           81 bpm. Exam Location:  Inpatient  Procedure: 2D Echo  Indications:    acute systolic heart failure  History:        Patient has prior history of Echocardiogram examinations, most recent 07/12/2021. CAD; Risk Factors:Diabetes, Dyslipidemia and Hypertension.  Sonographer:    Johny Chess RDCS Referring Phys: [id number] JENNIFER YATES   Sonographer Comments: Technically difficult study due to poor echo windows and patient is obese. Image acquisition challenging due to patient body habitus. IMPRESSIONS   1. Technically difficult study, poor echo windows. Definity contrast given 2. Left ventricular ejection fraction, by estimation, is 65 to 70%. Left ventricular ejection fraction by PLAX is 65 %. The left ventricle has normal function. The left ventricle has no regional wall motion abnormalities. There is mild left ventricular hypertrophy. Left ventricular diastolic parameters are indeterminate. 3. Right ventricular systolic function is normal. The right ventricular size is normal. 4. The mitral valve is abnormal. Trivial mitral valve regurgitation. 5. The aortic valve was not well visualized. Aortic valve regurgitation is not visualized. 6. The inferior vena cava is normal in size with greater than 50% respiratory variability, suggesting right atrial pressure of 3 mmHg.  Comparison(s): No significant change from prior study. 07/12/2021: LVEF 65-70%, mild LVH.  FINDINGS Left Ventricle: Left ventricular ejection fraction, by estimation, is 65 to 70%. Left ventricular ejection fraction by PLAX is 65 %. The left ventricle has normal function. The left ventricle has no regional wall motion abnormalities. Definity contrast agent was given IV to delineate the left ventricular endocardial borders. The left ventricular internal cavity size was normal in size. There is mild left ventricular hypertrophy. Left  ventricular diastolic parameters are indeterminate.  Right Ventricle: The right ventricular size is normal. No increase in right ventricular wall thickness. Right ventricular systolic function is normal.  Left Atrium: Left atrial size was normal in size.  Right Atrium: Right atrial size was normal in size.  Pericardium: There is no evidence of pericardial effusion.  Mitral Valve: The mitral valve is abnormal. Mild mitral annular calcification. Trivial mitral valve regurgitation.  Tricuspid Valve: The tricuspid valve is not well visualized. Tricuspid valve regurgitation is not demonstrated.  Aortic Valve: The aortic valve was not well visualized. Aortic valve regurgitation is not visualized.  Pulmonic Valve: The pulmonic valve was not well visualized. Pulmonic valve regurgitation is not visualized.  Aorta: The aortic root and ascending aorta are structurally normal, with no evidence of dilitation.  Venous: The inferior vena cava is normal in size with greater than 50% respiratory variability, suggesting right atrial pressure of 3 mmHg.  IAS/Shunts: No atrial level shunt detected by color flow Doppler.   LEFT VENTRICLE PLAX 2D LV EF:         Left            Diastology ventricular     LV e' medial:  11.40 cm/s ejection        LV E/e' medial:  8.1 fraction by     LV e' lateral:   9.79 cm/s PLAX is 65      LV E/e' lateral: 9.4 %. LVIDd:         4.80 cm LVIDs:         3.10 cm LV PW:         1.20 cm LV IVS:        1.20 cm LVOT diam:     1.90 cm LV SV:         58 LV SV Index:   21 LVOT Area:     2.84 cm   RIGHT VENTRICLE             IVC RV S prime:     15.30 cm/s  IVC diam: 2.00 cm TAPSE (M-mode): 2.0 cm  LEFT ATRIUM           Index        RIGHT ATRIUM           Index LA diam:      4.30 cm 1.55 cm/m   RA Area:     11.90 cm LA Vol (A4C): 57.2 ml 20.64 ml/m  RA Volume:   25.60 ml  9.24 ml/m AORTIC VALVE LVOT Vmax:   116.00 cm/s LVOT Vmean:  73.500 cm/s LVOT VTI:     0.203 m  AORTA Ao Root diam: 2.80 cm Ao Asc diam:  3.30 cm  MITRAL VALVE MV Area (PHT): 3.77 cm    SHUNTS MV Decel Time: 201 msec    Systemic VTI:  0.20 m MV E velocity: 92.40 cm/s  Systemic Diam: 1.90 cm MV A velocity: 76.50 cm/s MV E/A ratio:  1.21  Lyman Bishop MD Electronically signed by Lyman Bishop MD Signature Date/Time: 08/15/2022/4:57:21 PM    Final               Recent Labs: 08/15/2022: B Natriuretic Peptide 77.3 08/23/2022: ALT 36; BUN 22; Creatinine, Ser 1.28; Hemoglobin 9.8; Magnesium 2.0; Platelets 293; Potassium 3.6; Sodium 139  Recent Lipid Panel    Component Value Date/Time   CHOL 131 03/05/2022 1619   TRIG 129 03/05/2022 1619   HDL 42 03/05/2022 1619   CHOLHDL 3.1 03/05/2022 1619   CHOLHDL 3.4 07/23/2021 0559   VLDL 12 07/23/2021 0559   LDLCALC 66 03/05/2022 1619     Physical Exam:    VS:  BP (!) 140/60   Pulse 85   Ht 5' 7.5" (1.715 m)   Wt (!) 398 lb (180.5 kg)   SpO2 98%   BMI 61.42 kg/m     Wt Readings from Last 3 Encounters:  03/09/23 (!) 398 lb (180.5 kg)  01/26/23 (!) 399 lb (181 kg)  11/02/22 (!) 414 lb (187.8 kg)    GEN: Obese female NAD HEENT: Normal NECK: thick neck LYMPHATICS: No lymphadenopathy CARDIAC: RRR, no murmurs, rubs, gallops RESPIRATORY:  Clear to auscultation without rales, wheezing or rhonchi  ABDOMEN: Soft, non-tender, non-distended MUSCULOSKELETAL:  Non pitting edema edema; No deformity  SKIN: Warm and dry NEUROLOGIC:  Alert and oriented x 3 PSYCHIATRIC:  Normal affect   ASSESSMENT:    1. Coronary artery disease involving native coronary artery of native heart without angina pectoris   2. Essential hypertension   3. Coronary artery disease involving native coronary artery of native heart with angina pectoris ([place])   4. OSA (obstructive sleep apnea)   5. Chronic  heart failure with preserved ejection fraction ([place])   6. Morbid obesity ([place])    PLAN:    Coronary Artery Disease;  Obstructive HLD HTN - asymptomatic on her baseline therapy - continue ASA 81 mg - continue statin, goal LDL < 70 - given minimal sx will stop BB - continue nitrates; felt best on Imdur 60 mg - restart her losartan 100 mg and BMP in one week   Heart Failure Preserved Ejection Fraction (diastolic) - chronic  - with OSA on CPAP, HTN, Super morbid obesity on Ozempic - NYHA class I, Stage B, near euvolemic, etiology from the above - Diuretic regimen: Torsemide 40 mg PO BID - issues with yeast infections on SGT2i and is not amenable to rechallenge - if worsening BP would start MRA  Preoperative Risk Assessment  - The Revised Cardiac Risk Index = 2 due to  CAD and CHF which equates to 6.6%,  moderate  estimated risk of perioperative myocardial infarction, pulmonary edema, ventricular fibrillation, cardiac arrest, or complete heart block.  - She is well compensated from her cardiac conditions at this time and has greater than 4 functional mets - No further cardiac testing is recommended prior to surgery.  - The patient may proceed to surgery at acceptable risk.  Patient is amenable to surgery and is aware of risks - ASA can be held as needed for up to 5 days before procedures and can be restarted when cleared per surgery  We will fax a copy of our note to: [facility] [place]: [phone]    One year with me or my team.       Medication Adjustments/Labs and Tests Ordered: Current medicines are reviewed at length with the patient today.  Concerns regarding medicines are outlined above.  Orders Placed This Encounter  Procedures   Basic metabolic panel   Meds ordered this encounter  Medications   isosorbide mononitrate (IMDUR) 60 MG 24 hr tablet    Sig: Take 1 tablet (60 mg total) by mouth daily.    Dispense:  90 tablet    Refill:  3   losartan (COZAAR) 100 MG tablet    Sig: Take 1 tablet (100 mg total) by mouth daily.    Dispense:  90 tablet     Refill:  3    Patient Instructions  Medication Instructions:  Your physician has recommended you make the following change in your medication:  STOP: metoprolol  START: losartan 100 mg by mouth once daily  REFILLED: isosorbide mononitrate (IMDUR) 60 mg by mouth once daily  *If you need a refill on your cardiac medications before your next appointment, please call your pharmacy*   Lab Work: IN 1 WEEK: BMP  If you have labs (blood work) drawn today and your tests are completely normal, you will receive your results only by: [place] (if you have MyChart) OR A paper copy in the mail If you have any lab test that is abnormal or we need to change your treatment, we will call you to review the results.   Testing/Procedures: NONE   Follow-Up: At [facility], you and your health needs are our priority.  As part of our continuing mission to provide you with exceptional heart care, we have created designated Provider Care Teams.  These Care Teams include your primary Cardiologist (physician) and Advanced Practice Providers (APPs -  Physician Assistants and Nurse Practitioners) who all work together to provide you with the care you need, when you need  it.  Your next appointment:   1 year(s)  Provider:   Werner Lean, MD  or Nicholes Rough, PA-C, Ambrose Pancoast, NP, or Christen Bame, NP         Signed, Werner Lean, MD  03/09/2023 10:43 AM    [place]

## 2023-03-09 ENCOUNTER — Encounter: Payer: Self-pay | Admitting: Internal Medicine

## 2023-03-09 ENCOUNTER — Ambulatory Visit: Payer: Medicaid Other | Attending: Internal Medicine | Admitting: Internal Medicine

## 2023-03-09 VITALS — BP 140/60 | HR 85 | Ht 67.5 in | Wt 398.0 lb

## 2023-03-09 DIAGNOSIS — I252 Old myocardial infarction: Secondary | ICD-10-CM

## 2023-03-09 DIAGNOSIS — I2510 Atherosclerotic heart disease of native coronary artery without angina pectoris: Secondary | ICD-10-CM

## 2023-03-09 DIAGNOSIS — I25119 Atherosclerotic heart disease of native coronary artery with unspecified angina pectoris: Secondary | ICD-10-CM

## 2023-03-09 DIAGNOSIS — I5032 Chronic diastolic (congestive) heart failure: Secondary | ICD-10-CM | POA: Diagnosis not present

## 2023-03-09 DIAGNOSIS — E7800 Pure hypercholesterolemia, unspecified: Secondary | ICD-10-CM

## 2023-03-09 DIAGNOSIS — I10 Essential (primary) hypertension: Secondary | ICD-10-CM

## 2023-03-09 DIAGNOSIS — G4733 Obstructive sleep apnea (adult) (pediatric): Secondary | ICD-10-CM | POA: Diagnosis not present

## 2023-03-09 MED ORDER — LOSARTAN POTASSIUM 100 MG PO TABS: 100.0000 mg | ORAL_TABLET | Freq: Every day | ORAL | 3 refills | Status: DC

## 2023-03-09 MED ORDER — ISOSORBIDE MONONITRATE ER 60 MG PO TB24: 60.0000 mg | ORAL_TABLET | Freq: Every day | ORAL | 3 refills | Status: AC

## 2023-03-09 NOTE — Patient Instructions (Signed)
Medication Instructions:  Your physician has recommended you make the following change in your medication:  STOP: metoprolol  START: losartan 100 mg by mouth once daily  REFILLED: isosorbide mononitrate (IMDUR) 60 mg by mouth once daily  *If you need a refill on your cardiac medications before your next appointment, please call your pharmacy*   Lab Work: IN 1 WEEK: BMP  If you have labs (blood work) drawn today and your tests are completely normal, you will receive your results only by: [place] (if you have MyChart) OR A paper copy in the mail If you have any lab test that is abnormal or we need to change your treatment, we will call you to review the results.   Testing/Procedures: NONE   Follow-Up: At [facility], you and your health needs are our priority.  As part of our continuing mission to provide you with exceptional heart care, we have created designated Provider Care Teams.  These Care Teams include your primary Cardiologist (physician) and Advanced Practice Providers (APPs -  Physician Assistants and Nurse Practitioners) who all work together to provide you with the care you need, when you need it.  Your next appointment:   1 year(s)  Provider:   Werner Lean, MD  or Nicholes Rough, PA-C, Ambrose Pancoast, NP, or Christen Bame, NP

## 2023-03-10 DIAGNOSIS — M549 Dorsalgia, unspecified: Secondary | ICD-10-CM | POA: Diagnosis not present

## 2023-03-10 DIAGNOSIS — Z6841 Body Mass Index (BMI) 40.0 and over, adult: Secondary | ICD-10-CM | POA: Diagnosis not present

## 2023-03-10 DIAGNOSIS — Z3200 Encounter for pregnancy test, result unknown: Secondary | ICD-10-CM | POA: Diagnosis not present

## 2023-03-10 DIAGNOSIS — Z794 Long term (current) use of insulin: Secondary | ICD-10-CM | POA: Diagnosis not present

## 2023-03-10 DIAGNOSIS — Z79899 Other long term (current) drug therapy: Secondary | ICD-10-CM | POA: Diagnosis not present

## 2023-03-10 DIAGNOSIS — I10 Essential (primary) hypertension: Secondary | ICD-10-CM | POA: Diagnosis not present

## 2023-03-10 DIAGNOSIS — M47816 Spondylosis without myelopathy or radiculopathy, lumbar region: Secondary | ICD-10-CM | POA: Diagnosis not present

## 2023-03-10 DIAGNOSIS — E1169 Type 2 diabetes mellitus with other specified complication: Secondary | ICD-10-CM | POA: Diagnosis not present

## 2023-03-10 DIAGNOSIS — Z0130 Encounter for examination of blood pressure without abnormal findings: Secondary | ICD-10-CM | POA: Diagnosis not present

## 2023-03-10 DIAGNOSIS — M109 Gout, unspecified: Secondary | ICD-10-CM | POA: Diagnosis not present

## 2023-03-10 DIAGNOSIS — D508 Other iron deficiency anemias: Secondary | ICD-10-CM | POA: Diagnosis not present

## 2023-03-10 DIAGNOSIS — M19011 Primary osteoarthritis, right shoulder: Secondary | ICD-10-CM | POA: Diagnosis not present

## 2023-03-11 ENCOUNTER — Encounter (INDEPENDENT_AMBULATORY_CARE_PROVIDER_SITE_OTHER): Payer: Self-pay | Admitting: Primary Care

## 2023-03-11 ENCOUNTER — Telehealth: Payer: Self-pay

## 2023-03-11 ENCOUNTER — Ambulatory Visit (INDEPENDENT_AMBULATORY_CARE_PROVIDER_SITE_OTHER): Payer: Medicaid Other | Admitting: Primary Care

## 2023-03-11 VITALS — BP 145/81 | HR 96 | Resp 16 | Wt >= 6400 oz

## 2023-03-11 DIAGNOSIS — E118 Type 2 diabetes mellitus with unspecified complications: Secondary | ICD-10-CM

## 2023-03-11 LAB — POCT GLYCOSYLATED HEMOGLOBIN (HGB A1C): HbA1c, POC (controlled diabetic range): 7.6 % — AB (ref 0.0–7.0)

## 2023-03-11 NOTE — Telephone Encounter (Signed)
I spoke to the patient when she was at her appointment with Juluis Mire, NP today.  The patient explained that her CPAP machine that she received in 2020 was recently taken away and she needs a new order to have it re-instated.  She is not sure why it was removed/discontinued.   I spoke to [facility] and she explained that they had no utilization data for the patient's  "vent" referring to a trilogy machine so they picked it up from patient's home 02/28/2023.  Denae was not sure if another sleep study is necessary to have it reinstated, so she will have a member of the PAP department contact me

## 2023-03-14 NOTE — Progress Notes (Signed)
Patient presents to clinic today for annual exam.  Acute Concerns: None  Chronic Issues: Past Medical History:  Diagnosis Date   ARF (acute renal failure) ([place]) 04/2015   Asthma    Cellulitis of right upper extremity    Coronary artery disease    Diabetes mellitus    insulin dependent   Hyperlipidemia LDL goal <70    Hypertension    Obesity    S/P angioplasty with stent 08/2016   DES to mLAD and PTCA only to 2nd diag ostium.      Health Maintenance: Immunizations -- UTD PAP -- 06/03/21  HIV/Hep C Screening (negative ) UTD  Past Medical History:  Diagnosis Date   ARF (acute renal failure) ([place]) 04/2015   Asthma    Cellulitis of right upper extremity    Coronary artery disease    Diabetes mellitus    insulin dependent   Hyperlipidemia LDL goal <70    Hypertension    Obesity    S/P angioplasty with stent 08/2016   DES to mLAD and PTCA only to 2nd diag ostium.     Past Surgical History:  Procedure Laterality Date   CARDIAC CATHETERIZATION N/A 09/07/2016   Procedure: Left Heart Cath and Coronary Angiography;  Surgeon: [name], MD;  Location: [facility];  Service: Cardiovascular;  Laterality: N/A;   CARDIAC CATHETERIZATION N/A 09/07/2016   Procedure: Coronary Stent Intervention;  Surgeon: [name], MD;  Location: [facility];  Service: Cardiovascular;  Laterality: N/A;   CARDIAC CATHETERIZATION N/A 09/07/2016   Procedure: Coronary Balloon Angioplasty;  Surgeon: [name], MD;  Location: [facility];  Service: Cardiovascular;  Laterality: N/A;   CESAREAN SECTION     IRRIGATION AND DEBRIDEMENT SHOULDER Right 04/30/2015   Procedure: IRRIGATION AND DEBRIDEMENT SHOULDER;  Surgeon: Renette Butters, MD;  Location: [place];  Service: Orthopedics;  Laterality: Right;   IRRIGATION AND DEBRIDEMENT SHOULDER Right 05/01/2015   LEFT HEART CATH AND CORONARY ANGIOGRAPHY N/A 04/25/2020   Procedure: LEFT HEART CATH AND CORONARY ANGIOGRAPHY;  Surgeon:  [name], MD;  Location: [facility];  Service: Cardiovascular;  Laterality: N/A;   LEG SURGERY     SHOULDER ARTHROSCOPY Right 04/30/2015   Procedure: ARTHROSCOPY SHOULDER;  Surgeon: Renette Butters, MD;  Location: [facility];  Service: Orthopedics;  Laterality: Right;   TONSILLECTOMY      Current Outpatient Medications on File Prior to Visit  Medication Sig Dispense Refill   Accu-Chek FastClix Lancets MISC USE 1 TO CHECK GLUCOSE 4 TIMES DAILY 102 each 2   acetaminophen (TYLENOL) 325 MG tablet Take 2 tablets (650 mg total) by mouth every 6 (six) hours as needed for mild pain, fever or headache. 30 tablet 0   albuterol (VENTOLIN HFA) 108 (90 Base) MCG/ACT inhaler Inhale 2 puffs into the lungs every 6 (six) hours as needed for wheezing or shortness of breath. 18 g 1   allopurinol (ZYLOPRIM) 100 MG tablet Take 100 mg by mouth daily.     aspirin 81 MG tablet Take 1 tablet (81 mg total) by mouth daily. 30 tablet 11   atorvastatin (LIPITOR) 20 MG tablet TAKE 1 TABLET(20 MG) BY MOUTH DAILY 90 tablet 3   BD PEN NEEDLE NANO 2ND GEN 32G X 4 MM MISC Use to inject into the skin in the morning, at noon, and at bedtime as directed. 100 each 11   BD VEO INSULIN SYRINGE U/F 31G X 15/64" 1  ML MISC USE  SYRINGE ONCE DAILY 100 each 1   Blood Glucose Monitoring Suppl (ACCU-CHEK AVIVA PLUS) w/Device KIT 1 each by Does not apply route as directed. 1 kit 0   Continuous Blood Gluc Transmit (DEXCOM G6 TRANSMITTER) MISC Inject into the skin.     diclofenac Sodium (VOLTAREN) 1 % GEL Apply 4 g topically 4 (four) times daily. (Patient taking differently: Apply 4 g topically 4 (four) times daily as needed (pain).) 350 g 1   DULoxetine (CYMBALTA) 30 MG capsule TAKE 1 CAPSULE(30 MG) BY MOUTH DAILY 90 capsule 1   fluticasone (FLOVENT HFA) 110 MCG/ACT inhaler Inhale 2 puffs into the lungs 2 (two) times daily. 1 each 12   gabapentin (NEURONTIN) 300 MG capsule TAKE 2 CAPSULES(600 MG) BY MOUTH THREE TIMES DAILY 180 capsule  0   glucose blood (ACCU-CHEK AVIVA PLUS) test strip Use as instructed to test blood sugar 4 times daily. 100 each 12   HUMULIN R U-500 KWIKPEN 500 UNIT/ML KwikPen Inject 75-80 Units into the skin 3 (three) times daily with meals. AM 80 units Noon 75 units Evening 75 units If BG above 200 add 5 units to prescribed dose 3 mL 3   hydrALAZINE (APRESOLINE) 100 MG tablet TAKE 1 TABLET(100 MG) BY MOUTH THREE TIMES DAILY 120 tablet 1   Insulin Pen Needle (B-D UF III MINI PEN NEEDLES) 31G X 5 MM MISC Use as instructed. Monitor blood glucose levels three times per day 90 each 1   isosorbide mononitrate (IMDUR) 60 MG 24 hr tablet Take 1 tablet (60 mg total) by mouth daily. 90 tablet 3   losartan (COZAAR) 100 MG tablet Take 1 tablet (100 mg total) by mouth daily. 90 tablet 3   Melatonin 10 MG TABS Take 3 tablets by mouth at bedtime as needed (sleep). 90 tablet 1   methocarbamol (ROBAXIN) 500 MG tablet Take 1 tablet (500 mg total) by mouth every 8 (eight) hours as needed for muscle spasms. 90 tablet 0   Multiple Vitamins-Minerals (MULTIVITAMIN PO) Take 1 tablet by mouth daily.     naloxone (NARCAN) nasal spray 4 mg/0.1 mL Place into both nostrils as needed (over dose).     nitroGLYCERIN (NITROSTAT) 0.4 MG SL tablet Place 1 tablet (0.4 mg total) under the tongue every 5 (five) minutes as needed for chest pain. Please keep upcoming appt in October 2022. Final Attempt 25 tablet 2   oxyCODONE-acetaminophen (PERCOCET) 10-325 MG tablet Take 1 tablet by mouth 3 (three) times daily as needed.     potassium chloride SA (KLOR-CON) 20 MEQ tablet Take 1 tablet (20 mEq total) by mouth daily. 90 tablet 3   Semaglutide, 1 MG/DOSE, (OZEMPIC, 1 MG/DOSE,) 2 MG/1.5ML SOPN Inject 1 mg into the skin once a week.     sodium chloride (OCEAN) 0.65 % nasal spray Place 1 spray into the nose as needed for congestion.     torsemide (DEMADEX) 20 MG tablet Take 1 tablet (20 mg total) by mouth 2 (two) times daily. Dose Change: Take 2 tabs (40  mg total) twice a day. 360 tablet 1   Vitamin D, Ergocalciferol, (DRISDOL) 1.25 MG (50000 UNIT) CAPS capsule Take 50,000 Units by mouth once a week.     No current facility-administered medications on file prior to visit.    Allergies  Allergen Reactions   Acetaminophen Itching and Swelling    Itching of the mouth, swelling of tongue and stomach started hurting mild   Hydrazine Yellow [Fd&C Yellow #5 (Tartrazine)] Shortness  Of Breath and Swelling    Swelling mostly noticed in legs and feet, retaining urination, shortness of breaht, and minor chest pain   Lisinopril Shortness Of Breath    Was on prinzide; had sob/chest pain on it.   Pineapple Itching    Family History  Problem Relation Age of Onset   Diabetes Mother    Hypertension Mother    Heart disease Mother    Diabetes Father    Heart disease Father    Stroke Maternal Grandmother    Cancer Maternal Grandmother    Breast cancer Cousin        30's   Breast cancer Cousin        50's    Social History   Socioeconomic History   Marital status: Single    Spouse name: Not on file   Number of children: Not on file   Years of education: Not on file   Highest education level: Not on file  Occupational History   Occupation: disabled  Tobacco Use   Smoking status: Former    Packs/day: 0.25    Years: 2.00    Additional pack years: 0.00    Total pack years: 0.50    Types: Cigarettes    Quit date: 2008    Years since quitting: 16.2   Smokeless tobacco: Never  Vaping Use   Vaping Use: Never used  Substance and Sexual Activity   Alcohol use: Yes    Comment: socially   Drug use: No   Sexual activity: Not on file  Other Topics Concern   Not on file  Social History Narrative   Not on file   Social Determinants of Health   Financial Resource Strain: Low Risk  (10/22/2021)   Overall Financial Resource Strain (CARDIA)    Difficulty of Paying Living Expenses: Not hard at all  Food Insecurity: No Food Insecurity  (10/22/2021)   Hunger Vital Sign    Worried About Running Out of Food in the Last Year: Never true    [place] in the Last Year: Never true  Transportation Needs: No Transportation Needs (10/22/2021)   PRAPARE - Hydrologist (Medical): No    Lack of Transportation (Non-Medical): No  Physical Activity: Insufficiently Active (10/22/2021)   Exercise Vital Sign    Days of Exercise per Week: 3 days    Minutes of Exercise per Session: 20 min  Stress: Stress Concern Present (10/22/2021)   [place]    Feeling of Stress : To some extent  Social Connections: Unknown (10/22/2021)   Social Connection and Isolation Panel [NHANES]    Frequency of Communication with Friends and Family: More than three times a week    Frequency of Social Gatherings with Friends and Family: More than three times a week    Attends Religious Services: More than 4 times per year    Active Member of [employer] or Organizations: Yes    Attends Archivist Meetings: 1 to 4 times per year    Marital Status: Not on file  Intimate Partner Violence: Not on file    ROS  Blood Pressure (Abnormal) 145/81   Pulse 96   Respiration 16   Weight (Abnormal) 402 lb (182.3 kg)   Oxygen Saturation 96%   Body Mass Index 62.03 kg/m    Physical exam: General: Vital signs reviewed.  Patient is well-developed and well-nourished, severe morbid obesity  in no acute distress  and cooperative with exam. Head: Normocephalic and atraumatic. Eyes: EOMI, conjunctivae normal, no scleral icterus. Neck: Supple, trachea midline, normal ROM, no JVD, masses, thyromegaly, or carotid bruit present. Cardiovascular: RRR, S1 normal, S2 normal, no murmurs, gallops, or rubs. Pulmonary/Chest: Clear to auscultation bilaterally, no wheezes, rales, or rhonchi. Abdominal: Soft, non-tender, non-distended, BS +, no masses, organomegaly, or guarding  present. Musculoskeletal: No joint deformities, erythema, or stiffness, ROM full and nontender. Extremities: No lower extremity edema bilaterally,  pulses symmetric and intact bilaterally. No cyanosis or clubbing. Neurological: A&O x3, Strength is normal Skin: Warm, dry and intact. No rashes or erythema. Psychiatric: Normal mood and affect. speech and behavior is normal. Cognition and memory are normal.    Recent Results (from the past 2160 hour(s))  POCT glycosylated hemoglobin (Hb A1C)     Status: Abnormal   Collection Time: 03/11/23 10:49 AM  Result Value Ref Range   Hemoglobin A1C     HbA1c POC (<> result, manual entry)     HbA1c, POC (prediabetic range)     HbA1c, POC (controlled diabetic range) 7.6 (A) 0.0 - 7.0 %    Assessment/Plan: No problem-specific Assessment & Plan notes found for this encounter.    This visit occurred during the SARS-CoV-2 public health emergency.  Safety protocols were in place, including screening questions prior to the visit, additional usage of staff PPE, and extensive cleaning of exam room while observing appropriate contact time as indicated for disinfecting solutions.    [name], NP

## 2023-03-15 DIAGNOSIS — Z794 Long term (current) use of insulin: Secondary | ICD-10-CM | POA: Diagnosis not present

## 2023-03-15 DIAGNOSIS — E1140 Type 2 diabetes mellitus with diabetic neuropathy, unspecified: Secondary | ICD-10-CM | POA: Diagnosis not present

## 2023-03-16 DIAGNOSIS — Z794 Long term (current) use of insulin: Secondary | ICD-10-CM | POA: Diagnosis not present

## 2023-03-16 DIAGNOSIS — I2510 Atherosclerotic heart disease of native coronary artery without angina pectoris: Secondary | ICD-10-CM | POA: Diagnosis not present

## 2023-03-16 DIAGNOSIS — E1140 Type 2 diabetes mellitus with diabetic neuropathy, unspecified: Secondary | ICD-10-CM | POA: Diagnosis not present

## 2023-03-16 DIAGNOSIS — G4733 Obstructive sleep apnea (adult) (pediatric): Secondary | ICD-10-CM | POA: Diagnosis not present

## 2023-03-16 DIAGNOSIS — Z6841 Body Mass Index (BMI) 40.0 and over, adult: Secondary | ICD-10-CM | POA: Diagnosis not present

## 2023-03-16 DIAGNOSIS — E785 Hyperlipidemia, unspecified: Secondary | ICD-10-CM | POA: Diagnosis not present

## 2023-03-16 DIAGNOSIS — I10 Essential (primary) hypertension: Secondary | ICD-10-CM | POA: Diagnosis not present

## 2023-03-17 ENCOUNTER — Ambulatory Visit: Payer: Medicaid Other | Attending: Primary Care

## 2023-03-17 DIAGNOSIS — Z79899 Other long term (current) drug therapy: Secondary | ICD-10-CM | POA: Diagnosis not present

## 2023-03-17 DIAGNOSIS — G4733 Obstructive sleep apnea (adult) (pediatric): Secondary | ICD-10-CM | POA: Diagnosis not present

## 2023-03-29 DIAGNOSIS — R0602 Shortness of breath: Secondary | ICD-10-CM | POA: Diagnosis not present

## 2023-03-29 DIAGNOSIS — E559 Vitamin D deficiency, unspecified: Secondary | ICD-10-CM | POA: Diagnosis not present

## 2023-03-29 DIAGNOSIS — Z3200 Encounter for pregnancy test, result unknown: Secondary | ICD-10-CM | POA: Diagnosis not present

## 2023-03-29 DIAGNOSIS — M129 Arthropathy, unspecified: Secondary | ICD-10-CM | POA: Diagnosis not present

## 2023-03-29 DIAGNOSIS — D539 Nutritional anemia, unspecified: Secondary | ICD-10-CM | POA: Diagnosis not present

## 2023-03-29 DIAGNOSIS — M19011 Primary osteoarthritis, right shoulder: Secondary | ICD-10-CM | POA: Diagnosis not present

## 2023-03-29 DIAGNOSIS — M109 Gout, unspecified: Secondary | ICD-10-CM | POA: Diagnosis not present

## 2023-03-29 DIAGNOSIS — Z0130 Encounter for examination of blood pressure without abnormal findings: Secondary | ICD-10-CM | POA: Diagnosis not present

## 2023-03-29 DIAGNOSIS — Z79899 Other long term (current) drug therapy: Secondary | ICD-10-CM | POA: Diagnosis not present

## 2023-03-29 DIAGNOSIS — D508 Other iron deficiency anemias: Secondary | ICD-10-CM | POA: Diagnosis not present

## 2023-03-29 DIAGNOSIS — Z6841 Body Mass Index (BMI) 40.0 and over, adult: Secondary | ICD-10-CM | POA: Diagnosis not present

## 2023-03-30 DIAGNOSIS — G4733 Obstructive sleep apnea (adult) (pediatric): Secondary | ICD-10-CM | POA: Diagnosis not present

## 2023-03-30 DIAGNOSIS — I2510 Atherosclerotic heart disease of native coronary artery without angina pectoris: Secondary | ICD-10-CM | POA: Diagnosis not present

## 2023-03-30 DIAGNOSIS — Z6841 Body Mass Index (BMI) 40.0 and over, adult: Secondary | ICD-10-CM | POA: Diagnosis not present

## 2023-03-30 DIAGNOSIS — E785 Hyperlipidemia, unspecified: Secondary | ICD-10-CM | POA: Diagnosis not present

## 2023-03-30 DIAGNOSIS — Z794 Long term (current) use of insulin: Secondary | ICD-10-CM | POA: Diagnosis not present

## 2023-03-30 DIAGNOSIS — E1140 Type 2 diabetes mellitus with diabetic neuropathy, unspecified: Secondary | ICD-10-CM | POA: Diagnosis not present

## 2023-03-30 DIAGNOSIS — I10 Essential (primary) hypertension: Secondary | ICD-10-CM | POA: Diagnosis not present

## 2023-03-31 NOTE — Telephone Encounter (Signed)
Vance Gather with [facility] transferred my call to Lindsay [name]/ Vent Department: [phone], option 3.  [name] explained that the patient will need a new sleep study and then they will need new orders with settings for the Trilogy if that is what is recommended

## 2023-03-31 NOTE — Telephone Encounter (Signed)
I spoke to Shelley/ [facility] to inquire if the patient needs a new sleep study as I have not heard back from the PAP department.  Vance Gather stated that per the records that she sees the patient received a delivery of supplies on 03/21/2023 but she could not confirm a PAP machine was delivered.   I called the patient and she said that she did receive CPAP supplies but not a machine.  I called Shelley/ [facility] back and informed her that the patient did not receive a PAP machine.

## 2023-04-04 ENCOUNTER — Other Ambulatory Visit (INDEPENDENT_AMBULATORY_CARE_PROVIDER_SITE_OTHER): Payer: Self-pay | Admitting: Primary Care

## 2023-04-04 DIAGNOSIS — G4733 Obstructive sleep apnea (adult) (pediatric): Secondary | ICD-10-CM

## 2023-04-05 ENCOUNTER — Encounter: Payer: Self-pay | Admitting: Internal Medicine

## 2023-04-06 ENCOUNTER — Other Ambulatory Visit: Payer: Self-pay

## 2023-04-06 ENCOUNTER — Other Ambulatory Visit (HOSPITAL_COMMUNITY): Payer: Self-pay

## 2023-04-08 NOTE — Telephone Encounter (Signed)
Order for sleep study has been placed.

## 2023-04-09 ENCOUNTER — Other Ambulatory Visit (INDEPENDENT_AMBULATORY_CARE_PROVIDER_SITE_OTHER): Payer: Self-pay | Admitting: Primary Care

## 2023-04-09 NOTE — Telephone Encounter (Signed)
Requested medication (s) are due for refill today: yes  Requested medication (s) are on the active medication list: yes  Last refill:  12/07/22  Future visit scheduled: yes  Notes to clinic:  Unable to refill per protocol, cannot delegate.      Requested Prescriptions  Pending Prescriptions Disp Refills   methocarbamol (ROBAXIN) 500 MG tablet [Pharmacy Med Name: METHOCARBAMOL  TABLETS] 90 tablet 0    Sig: TAKE 1 TABLET(500 MG) BY MOUTH EVERY 8 HOURS AS NEEDED FOR MUSCLE SPASMS     Not Delegated - Analgesics:  Muscle Relaxants Failed - 04/09/2023 12:40 PM      Failed - This refill cannot be delegated      Passed - Valid encounter within last 6 months    Recent Outpatient Visits           4 weeks ago Diabetes mellitus with complication (HCC)   [facility] Grayce Sessions, NP   7 months ago Diabetes mellitus with complication [facility])   [place] [facility] Grayce Sessions, NP   1 year ago Annual physical exam   [facility] Grayce Sessions, NP   1 year ago Uncomplicated asthma, unspecified asthma severity, unspecified whether persistent   [facility] Grayce Sessions, NP   2 years ago Mixed hyperlipidemia   [place] [facility] Grayce Sessions, NP       Future Appointments             In 5 months [name], Kinnie Scales, NP [place] [facility] [facility]

## 2023-04-13 NOTE — Telephone Encounter (Signed)
Will forward to provider  

## 2023-04-17 DIAGNOSIS — G4733 Obstructive sleep apnea (adult) (pediatric): Secondary | ICD-10-CM | POA: Diagnosis not present

## 2023-04-26 ENCOUNTER — Ambulatory Visit: Payer: Medicaid Other | Admitting: Dietician

## 2023-04-28 ENCOUNTER — Encounter: Payer: Self-pay | Admitting: Podiatry

## 2023-04-28 ENCOUNTER — Ambulatory Visit: Payer: Medicaid Other | Admitting: Podiatry

## 2023-04-28 VITALS — BP 167/86

## 2023-04-28 DIAGNOSIS — M79674 Pain in right toe(s): Secondary | ICD-10-CM | POA: Diagnosis not present

## 2023-04-28 DIAGNOSIS — E1142 Type 2 diabetes mellitus with diabetic polyneuropathy: Secondary | ICD-10-CM | POA: Diagnosis not present

## 2023-04-28 DIAGNOSIS — B351 Tinea unguium: Secondary | ICD-10-CM | POA: Diagnosis not present

## 2023-04-28 DIAGNOSIS — M79675 Pain in left toe(s): Secondary | ICD-10-CM | POA: Diagnosis not present

## 2023-04-30 DIAGNOSIS — M47816 Spondylosis without myelopathy or radiculopathy, lumbar region: Secondary | ICD-10-CM | POA: Diagnosis not present

## 2023-04-30 DIAGNOSIS — M19011 Primary osteoarthritis, right shoulder: Secondary | ICD-10-CM | POA: Diagnosis not present

## 2023-04-30 DIAGNOSIS — E1140 Type 2 diabetes mellitus with diabetic neuropathy, unspecified: Secondary | ICD-10-CM | POA: Diagnosis not present

## 2023-04-30 DIAGNOSIS — E119 Type 2 diabetes mellitus without complications: Secondary | ICD-10-CM | POA: Diagnosis not present

## 2023-04-30 DIAGNOSIS — I10 Essential (primary) hypertension: Secondary | ICD-10-CM | POA: Diagnosis not present

## 2023-04-30 DIAGNOSIS — Z6841 Body Mass Index (BMI) 40.0 and over, adult: Secondary | ICD-10-CM | POA: Diagnosis not present

## 2023-04-30 DIAGNOSIS — M109 Gout, unspecified: Secondary | ICD-10-CM | POA: Diagnosis not present

## 2023-04-30 DIAGNOSIS — G8929 Other chronic pain: Secondary | ICD-10-CM | POA: Diagnosis not present

## 2023-04-30 DIAGNOSIS — Z3200 Encounter for pregnancy test, result unknown: Secondary | ICD-10-CM | POA: Diagnosis not present

## 2023-04-30 DIAGNOSIS — D508 Other iron deficiency anemias: Secondary | ICD-10-CM | POA: Diagnosis not present

## 2023-04-30 DIAGNOSIS — Z794 Long term (current) use of insulin: Secondary | ICD-10-CM | POA: Diagnosis not present

## 2023-04-30 DIAGNOSIS — M549 Dorsalgia, unspecified: Secondary | ICD-10-CM | POA: Diagnosis not present

## 2023-04-30 DIAGNOSIS — Z79899 Other long term (current) drug therapy: Secondary | ICD-10-CM | POA: Diagnosis not present

## 2023-04-30 DIAGNOSIS — Z0130 Encounter for examination of blood pressure without abnormal findings: Secondary | ICD-10-CM | POA: Diagnosis not present

## 2023-05-03 NOTE — Progress Notes (Signed)
  Subjective:  Patient ID: Lindsay [name], female    DOB: January 11, 1981,  MRN: [id number]  Lindsay [name] presents to clinic today for at risk foot care with history of diabetic neuropathy and painful elongated overgrown toenails which are tender when wearing enclosed shoe gear.  Chief Complaint  Patient presents with   Nail Problem    Debridement,B/S-103,last A1C-7.9,PCP-   Grayce Sessions, NP      New problem(s): None.   PCP is Grayce Sessions, NP.  Allergies  Allergen Reactions   Acetaminophen Itching and Swelling    Itching of the mouth, swelling of tongue and stomach started hurting mild   Hydrazine Yellow [Fd&C Yellow #5 (Tartrazine)] Shortness Of Breath and Swelling    Swelling mostly noticed in legs and feet, retaining urination, shortness of breaht, and minor chest pain   Lisinopril Shortness Of Breath    Was on prinzide; had sob/chest pain on it.   Pineapple Itching    Review of Systems: Negative except as noted in the HPI.  Objective: No changes noted in today's physical examination. Vitals:   04/28/23 1033  BP: (!) 167/86   Lindsay [name] is a pleasant 42 y.o. female morbidly obese in NAD. AAO x 3.  Vascular Examination: Capillary refill time immediate b/l. Vascular status intact b/l with palpable pedal pulses. Pedal hair sparse b/l. Trace edema. No pain with calf compression b/l. Skin temperature gradient WNL b/l. No cyanosis or clubbing noted b/l LE.  Neurological Examination:Pt has subjective symptoms of neuropathy. Protective sensation diminished with 10g monofilament b/l.  Dermatological Examination: Pedal skin with normal turgor, texture and tone b/l.  No open wounds. No interdigital macerations.   Toenails 1-5 b/l thick, discolored, elongated with subungual debris and pain on dorsal palpation.   Pedal skin is warm and supple b/l LE. No open wounds b/l LE. No interdigital macerations noted b/l LE. Toenails 1-5 b/l elongated, discolored,  dystrophic, thickened, crumbly with subungual debris and tenderness to dorsal palpation. No hyperkeratotic nor porokeratotic lesions present on today's visit.  Musculoskeletal Examination: Muscle strength 5/5 to all lower extremity muscle groups bilaterally. No pain, crepitus or joint limitation noted with ROM bilateral LE. Pes planus deformity noted bilateral LE. Utilizes cane for ambulation assistance.  Radiographs: None  Assessment/Plan: 1. Pain due to onychomycosis of toenails of both feet   2. Diabetic peripheral neuropathy associated with type 2 diabetes mellitus (HCC)     No orders of the defined types were placed in this encounter.   -Consent given for treatment as described below: -Examined patient. -Patient to continue soft, supportive shoe gear daily. -Toenails 1-5 b/l were debrided in length and girth with sterile nail nippers and dremel without iatrogenic bleeding.  -Patient/POA to call should there be question/concern in the interim.   Return in about 3 months (around 07/29/2023).  Lindsay [name], DPM

## 2023-05-06 ENCOUNTER — Ambulatory Visit (HOSPITAL_BASED_OUTPATIENT_CLINIC_OR_DEPARTMENT_OTHER): Payer: Medicaid Other | Attending: Primary Care | Admitting: Internal Medicine

## 2023-05-06 DIAGNOSIS — G4733 Obstructive sleep apnea (adult) (pediatric): Secondary | ICD-10-CM | POA: Diagnosis not present

## 2023-05-07 DIAGNOSIS — Z79899 Other long term (current) drug therapy: Secondary | ICD-10-CM | POA: Diagnosis not present

## 2023-05-09 DIAGNOSIS — G4733 Obstructive sleep apnea (adult) (pediatric): Secondary | ICD-10-CM | POA: Diagnosis not present

## 2023-05-09 NOTE — Procedures (Signed)
Patient Name: Lindsay [name], Lindsay [name] Study Date: 05/06/2023 Gender: Female D.O.B: 29-Sep-1981 Age (years): 67 Referring Provider: Grayce Sessions NP Height (inches): 67 Interpreting Physician: Jetty Duhamel MD, ABSM Weight (lbs): 389 RPSGT: Cherylann Parr BMI: 61 MRN: [id number] Neck Size: 19.00  CLINICAL INFORMATION Sleep Study Type: NPSG Indication for sleep study: Daytime Fatigue, Depression, Diabetes, Fatigue, Hypertension, Insomnia, Morbid Obesity, Obesity, Snoring, Witnesses Apnea / Gasping During Sleep Epworth Sleepiness Score: 9  Most recent polysomnogram was on 05/27/2019. Most recent titration study was on 07/22/2019.  SLEEP STUDY TECHNIQUE As per the AASM Manual for the Scoring of Sleep and Associated Events v2.3 (April 2016) with a hypopnea requiring 4% desaturations.  The channels recorded and monitored were frontal, central and occipital EEG, electrooculogram (EOG), submentalis EMG (chin), nasal and oral airflow, thoracic and abdominal wall motion, anterior tibialis EMG, snore microphone, electrocardiogram, and pulse oximetry.  MEDICATIONS Medications self-administered by patient taken the night of the study : Oxycodone, GABAPENTIN  SLEEP ARCHITECTURE The study was initiated at 10:13:46 PM and ended at 4:42:38 AM.  Sleep onset time was 0.3 minutes and the sleep efficiency was 89.3%. The total sleep time was 347.1 minutes.  Stage REM latency was N/A minutes.  The patient spent 2.4% of the night in stage N1 sleep, 97.6% in stage N2 sleep, 0.0% in stage N3 and 0% in REM.  Alpha intrusion was absent.  Supine sleep was 100.00%.  RESPIRATORY PARAMETERS The overall apnea/hypopnea index (AHI) was 6.6 per hour. There were 1 total apneas, including 0 obstructive, 1 central and 0 mixed apneas. There were 37 hypopneas and 0 RERAs.  The AHI during Stage REM sleep was N/A per hour.  AHI while supine was 6.6 per hour.  The mean oxygen saturation was 94.3%. The minimum  SpO2 during sleep was 86.0%.  loud snoring was noted during this study.  CARDIAC DATA The 2 lead EKG demonstrated sinus rhythm. The mean heart rate was 81.7 beats per minute. Other EKG findings include: None.  LEG MOVEMENT DATA The total PLMS were 0 with a resulting PLMS index of 0.0. Associated arousal with leg movement index was 0.0 .  IMPRESSIONS - Mild obstructive sleep apnea occurred during this study (AHI = 6.6/h). - No significant central sleep apnea occurred during this study (CAI = 0.2/h). - Mild oxygen desaturation was noted during this study (Min O2 = 86.0%, Mean 94.3%). - The patient snored with loud snoring volume. - No cardiac abnormalities were noted during this study. - Clinically significant periodic limb movements did not occur during sleep. No significant associated arousals. - Sleep pattern consistent with medication effect- almost all stage N2 with absent REM.  DIAGNOSIS - Obstructive Sleep Apnea (G47.33) - RECOMMENDATIONS - Treatment for very mild OSA is guided by symptoms and comorbidity. Conservative measures may include observation, weight loss and sleep position off back. Other options, including CPAP titration sleep study, autopap 5-15, a fitted oral appliance or ENT evaluation, would be based on clinical judgment. - Be careful with alcohol, sedatives and other CNS depressants that may worsen sleep apnea and disrupt normal sleep architecture. - Sleep hygiene should be reviewed to assess factors that may improve sleep quality. - Weight management and regular exercise should be initiated or continued if appropriate.  [Electronically signed] 05/09/2023 11:28 AM  Jetty Duhamel MD, ABSM Diplomate, American Board of Sleep Medicine NPI: [id number]  Jetty Duhamel Diplomate, Biomedical engineer of Sleep Medicine  ELECTRONICALLY SIGNED ON:  05/09/2023, 11:20 AM [facility] PH: [phone]   FX:  [phone] ACCREDITED BY THE AMERICAN ACADEMY OF SLEEP MEDICINE

## 2023-05-20 ENCOUNTER — Other Ambulatory Visit: Payer: Self-pay

## 2023-05-20 MED ORDER — TORSEMIDE 20 MG PO TABS: 40.0000 mg | ORAL_TABLET | Freq: Two times a day (BID) | ORAL | 3 refills | Status: DC

## 2023-05-24 DIAGNOSIS — I10 Essential (primary) hypertension: Secondary | ICD-10-CM | POA: Diagnosis not present

## 2023-05-24 DIAGNOSIS — D649 Anemia, unspecified: Secondary | ICD-10-CM | POA: Diagnosis not present

## 2023-05-24 DIAGNOSIS — E1122 Type 2 diabetes mellitus with diabetic chronic kidney disease: Secondary | ICD-10-CM | POA: Diagnosis not present

## 2023-05-24 DIAGNOSIS — Z794 Long term (current) use of insulin: Secondary | ICD-10-CM | POA: Diagnosis not present

## 2023-05-24 DIAGNOSIS — M899 Disorder of bone, unspecified: Secondary | ICD-10-CM | POA: Diagnosis not present

## 2023-05-24 DIAGNOSIS — N189 Chronic kidney disease, unspecified: Secondary | ICD-10-CM | POA: Diagnosis not present

## 2023-05-24 DIAGNOSIS — N1831 Chronic kidney disease, stage 3a: Secondary | ICD-10-CM | POA: Diagnosis not present

## 2023-05-28 DIAGNOSIS — Z79899 Other long term (current) drug therapy: Secondary | ICD-10-CM | POA: Diagnosis not present

## 2023-05-28 DIAGNOSIS — M19011 Primary osteoarthritis, right shoulder: Secondary | ICD-10-CM | POA: Diagnosis not present

## 2023-05-28 DIAGNOSIS — M549 Dorsalgia, unspecified: Secondary | ICD-10-CM | POA: Diagnosis not present

## 2023-05-28 DIAGNOSIS — Z6841 Body Mass Index (BMI) 40.0 and over, adult: Secondary | ICD-10-CM | POA: Diagnosis not present

## 2023-05-28 DIAGNOSIS — D508 Other iron deficiency anemias: Secondary | ICD-10-CM | POA: Diagnosis not present

## 2023-05-28 DIAGNOSIS — M47816 Spondylosis without myelopathy or radiculopathy, lumbar region: Secondary | ICD-10-CM | POA: Diagnosis not present

## 2023-05-28 DIAGNOSIS — M109 Gout, unspecified: Secondary | ICD-10-CM | POA: Diagnosis not present

## 2023-05-28 DIAGNOSIS — G8929 Other chronic pain: Secondary | ICD-10-CM | POA: Diagnosis not present

## 2023-05-28 DIAGNOSIS — E119 Type 2 diabetes mellitus without complications: Secondary | ICD-10-CM | POA: Diagnosis not present

## 2023-05-28 DIAGNOSIS — Z3200 Encounter for pregnancy test, result unknown: Secondary | ICD-10-CM | POA: Diagnosis not present

## 2023-05-28 DIAGNOSIS — I10 Essential (primary) hypertension: Secondary | ICD-10-CM | POA: Diagnosis not present

## 2023-05-28 DIAGNOSIS — R030 Elevated blood-pressure reading, without diagnosis of hypertension: Secondary | ICD-10-CM | POA: Diagnosis not present

## 2023-06-12 ENCOUNTER — Other Ambulatory Visit (INDEPENDENT_AMBULATORY_CARE_PROVIDER_SITE_OTHER): Payer: Self-pay | Admitting: Primary Care

## 2023-06-12 DIAGNOSIS — E118 Type 2 diabetes mellitus with unspecified complications: Secondary | ICD-10-CM

## 2023-06-14 NOTE — Telephone Encounter (Signed)
Requested Prescriptions  Pending Prescriptions Disp Refills   gabapentin (NEURONTIN) 300 MG capsule [Pharmacy Med Name: GABAPENTIN 300MG  CAPSULES] 180 capsule 0    Sig: TAKE 2 CAPSULES(600 MG) BY MOUTH THREE TIMES DAILY     Neurology: Anticonvulsants - gabapentin Failed - 06/12/2023  4:17 PM      Failed - Cr in normal range and within 360 days    Creat  Date Value Ref Range Status  09/16/2016 0.71 0.50 - 1.10 mg/dL Final   Creatinine, Ser  Date Value Ref Range Status  08/23/2022 1.28 (H) 0.44 - 1.00 mg/dL Final   Creatinine,U  Date Value Ref Range Status  02/18/2017 57.2 mg/dL Final   Creatinine, Urine  Date Value Ref Range Status  07/14/2021 193.74 mg/dL Final    Comment:    Performed at [facility], [address] [place], [place] [address]         Passed - Completed PHQ-2 or PHQ-9 in the last 360 days      Passed - Valid encounter within last 12 months    Recent Outpatient Visits           3 months ago Diabetes mellitus with complication [facility])   [place] [facility] Grayce Sessions, NP   9 months ago Diabetes mellitus with complication [facility])   [place] [facility] Grayce Sessions, NP   1 year ago Annual physical exam   [place] [facility] Grayce Sessions, NP   1 year ago Uncomplicated asthma, unspecified asthma severity, unspecified whether persistent   [place] [facility] Grayce Sessions, NP   2 years ago Mixed hyperlipidemia   [place] [facility] Grayce Sessions, NP       Future Appointments             In 3 months [name], Kinnie Scales, NP [place] [facility]

## 2023-06-15 DIAGNOSIS — Z6841 Body Mass Index (BMI) 40.0 and over, adult: Secondary | ICD-10-CM | POA: Diagnosis not present

## 2023-06-15 DIAGNOSIS — Z794 Long term (current) use of insulin: Secondary | ICD-10-CM | POA: Diagnosis not present

## 2023-06-15 DIAGNOSIS — E1140 Type 2 diabetes mellitus with diabetic neuropathy, unspecified: Secondary | ICD-10-CM | POA: Diagnosis not present

## 2023-06-15 DIAGNOSIS — E785 Hyperlipidemia, unspecified: Secondary | ICD-10-CM | POA: Diagnosis not present

## 2023-06-15 DIAGNOSIS — I10 Essential (primary) hypertension: Secondary | ICD-10-CM | POA: Diagnosis not present

## 2023-06-15 DIAGNOSIS — G4733 Obstructive sleep apnea (adult) (pediatric): Secondary | ICD-10-CM | POA: Diagnosis not present

## 2023-06-15 DIAGNOSIS — I2510 Atherosclerotic heart disease of native coronary artery without angina pectoris: Secondary | ICD-10-CM | POA: Diagnosis not present

## 2023-07-01 DIAGNOSIS — Z6841 Body Mass Index (BMI) 40.0 and over, adult: Secondary | ICD-10-CM | POA: Diagnosis not present

## 2023-07-12 DIAGNOSIS — F331 Major depressive disorder, recurrent, moderate: Secondary | ICD-10-CM | POA: Diagnosis not present

## 2023-07-13 DIAGNOSIS — I10 Essential (primary) hypertension: Secondary | ICD-10-CM | POA: Diagnosis not present

## 2023-07-13 DIAGNOSIS — M109 Gout, unspecified: Secondary | ICD-10-CM | POA: Diagnosis not present

## 2023-07-13 DIAGNOSIS — M47816 Spondylosis without myelopathy or radiculopathy, lumbar region: Secondary | ICD-10-CM | POA: Diagnosis not present

## 2023-07-13 DIAGNOSIS — Z6841 Body Mass Index (BMI) 40.0 and over, adult: Secondary | ICD-10-CM | POA: Diagnosis not present

## 2023-07-13 DIAGNOSIS — Z79899 Other long term (current) drug therapy: Secondary | ICD-10-CM | POA: Diagnosis not present

## 2023-07-13 DIAGNOSIS — Z3200 Encounter for pregnancy test, result unknown: Secondary | ICD-10-CM | POA: Diagnosis not present

## 2023-07-13 DIAGNOSIS — G8929 Other chronic pain: Secondary | ICD-10-CM | POA: Diagnosis not present

## 2023-07-13 DIAGNOSIS — D508 Other iron deficiency anemias: Secondary | ICD-10-CM | POA: Diagnosis not present

## 2023-07-13 DIAGNOSIS — M549 Dorsalgia, unspecified: Secondary | ICD-10-CM | POA: Diagnosis not present

## 2023-07-13 DIAGNOSIS — E119 Type 2 diabetes mellitus without complications: Secondary | ICD-10-CM | POA: Diagnosis not present

## 2023-07-13 DIAGNOSIS — M19011 Primary osteoarthritis, right shoulder: Secondary | ICD-10-CM | POA: Diagnosis not present

## 2023-07-15 DIAGNOSIS — Z79899 Other long term (current) drug therapy: Secondary | ICD-10-CM | POA: Diagnosis not present

## 2023-07-17 ENCOUNTER — Other Ambulatory Visit (INDEPENDENT_AMBULATORY_CARE_PROVIDER_SITE_OTHER): Payer: Self-pay | Admitting: Primary Care

## 2023-07-17 DIAGNOSIS — Z760 Encounter for issue of repeat prescription: Secondary | ICD-10-CM

## 2023-07-17 DIAGNOSIS — R202 Paresthesia of skin: Secondary | ICD-10-CM

## 2023-07-19 ENCOUNTER — Other Ambulatory Visit (INDEPENDENT_AMBULATORY_CARE_PROVIDER_SITE_OTHER): Payer: Self-pay | Admitting: Primary Care

## 2023-07-19 DIAGNOSIS — E118 Type 2 diabetes mellitus with unspecified complications: Secondary | ICD-10-CM

## 2023-07-19 NOTE — Telephone Encounter (Signed)
Will forward to provider  

## 2023-07-20 NOTE — Telephone Encounter (Signed)
Will forward to provider  

## 2023-08-02 ENCOUNTER — Ambulatory Visit: Payer: Medicaid Other | Admitting: Podiatry

## 2023-08-09 DIAGNOSIS — F320 Major depressive disorder, single episode, mild: Secondary | ICD-10-CM | POA: Diagnosis not present

## 2023-08-09 DIAGNOSIS — F411 Generalized anxiety disorder: Secondary | ICD-10-CM | POA: Diagnosis not present

## 2023-08-17 ENCOUNTER — Encounter: Payer: Self-pay | Admitting: Podiatry

## 2023-08-17 ENCOUNTER — Ambulatory Visit (INDEPENDENT_AMBULATORY_CARE_PROVIDER_SITE_OTHER): Payer: Medicaid Other | Admitting: Podiatry

## 2023-08-17 DIAGNOSIS — B351 Tinea unguium: Secondary | ICD-10-CM | POA: Diagnosis not present

## 2023-08-17 DIAGNOSIS — M79675 Pain in left toe(s): Secondary | ICD-10-CM | POA: Diagnosis not present

## 2023-08-17 DIAGNOSIS — M79674 Pain in right toe(s): Secondary | ICD-10-CM

## 2023-08-17 DIAGNOSIS — E1142 Type 2 diabetes mellitus with diabetic polyneuropathy: Secondary | ICD-10-CM

## 2023-08-17 NOTE — Progress Notes (Signed)
  Subjective:  Patient ID: Lindsay [name], female    DOB: September 13, 1981,   MRN: [id number]  Chief Complaint  Patient presents with   Diabetes    Patient is here for a follow up for chronic conditions for [facility]     42 y.o. female presents for concern of thickened elongated and painful nails that are difficult to trim. Requesting to have them trimmed today. Relates burning and tingling in their feet. Patient is diabetic and last A1c was  Lab Results  Component Value Date   HGBA1C 7.6 (A) 03/11/2023   .   PCP:  Grayce Sessions, NP    . Denies any other pedal complaints. Denies n/v/f/c.   Past Medical History:  Diagnosis Date   ARF (acute renal failure) (HCC) 04/2015   Asthma    Cellulitis of right upper extremity    Coronary artery disease    Diabetes mellitus    insulin dependent   Hyperlipidemia LDL goal <70    Hypertension    Obesity    S/P angioplasty with stent 08/2016   DES to mLAD and PTCA only to 2nd diag ostium.     Objective:  Physical Exam: Vascular: DP/PT pulses 2/4 bilateral. CFT <3 seconds. Absent hair growth on digits. Edema noted to bilateral lower extremities. Xerosis noted bilaterally.  Skin. No lacerations or abrasions bilateral feet. Nails 1-5 bilateral  are thickened discolored and elongated with subungual debris.  Musculoskeletal: MMT 5/5 bilateral lower extremities in DF, PF, Inversion and Eversion. Deceased ROM in DF of ankle joint.  Neurological: Sensation intact to light touch. Protective sensation diminished bilateral.    Assessment:   1. Pain due to onychomycosis of toenails of both feet   2. Diabetic peripheral neuropathy associated with type 2 diabetes mellitus (HCC)      Plan:  Patient was evaluated and treated and all questions answered. -Discussed and educated patient on diabetic foot care, especially with  regards to the vascular, neurological and musculoskeletal systems.  -Stressed the importance of good glycemic control and the  detriment of not  controlling glucose levels in relation to the foot. -Discussed supportive shoes at all times and checking feet regularly.  -Mechanically debrided all nails 1-5 bilateral using sterile nail nipper and filed with dremel without incident  -Answered all patient questions -Patient to return  in 3 months for at risk foot care -Patient advised to call the office if any problems or questions arise in the meantime.   Louann Sjogren, DPM

## 2023-08-26 ENCOUNTER — Other Ambulatory Visit (INDEPENDENT_AMBULATORY_CARE_PROVIDER_SITE_OTHER): Payer: Self-pay | Admitting: Primary Care

## 2023-08-26 DIAGNOSIS — E118 Type 2 diabetes mellitus with unspecified complications: Secondary | ICD-10-CM

## 2023-08-27 NOTE — Telephone Encounter (Signed)
Requested Prescriptions  Pending Prescriptions Disp Refills   gabapentin (NEURONTIN) 300 MG capsule [Pharmacy Med Name: GABAPENTIN 300MG  CAPSULES] 180 capsule 2    Sig: TAKE 2 CAPSULES(600 MG) BY MOUTH THREE TIMES DAILY     Neurology: Anticonvulsants - gabapentin Failed - 08/26/2023  8:05 AM      Failed - Cr in normal range and within 360 days    Creat  Date Value Ref Range Status  09/16/2016 0.71 0.50 - 1.10 mg/dL Final   Creatinine, Ser  Date Value Ref Range Status  08/23/2022 1.28 (H) 0.44 - 1.00 mg/dL Final   Creatinine,U  Date Value Ref Range Status  02/18/2017 57.2 mg/dL Final   Creatinine, Urine  Date Value Ref Range Status  07/14/2021 193.74 mg/dL Final    Comment:    Performed at [facility], [address], [place], [place] [address]         Passed - Completed PHQ-2 or PHQ-9 in the last 360 days      Passed - Valid encounter within last 12 months    Recent Outpatient Visits           5 months ago Diabetes mellitus with complication [facility])   [place] [facility] Grayce Sessions, NP   11 months ago Diabetes mellitus with complication [facility])   [place] [facility] Grayce Sessions, NP   1 year ago Annual physical exam   [place] [facility] Grayce Sessions, NP   1 year ago Uncomplicated asthma, unspecified asthma severity, unspecified whether persistent   [place] [facility] Grayce Sessions, NP   2 years ago Mixed hyperlipidemia   [place] [facility] Grayce Sessions, NP       Future Appointments             In 2 weeks [name], Kinnie Scales, NP [place] [facility]

## 2023-08-30 ENCOUNTER — Other Ambulatory Visit (HOSPITAL_COMMUNITY): Payer: Self-pay

## 2023-08-30 ENCOUNTER — Other Ambulatory Visit (HOSPITAL_BASED_OUTPATIENT_CLINIC_OR_DEPARTMENT_OTHER): Payer: Self-pay

## 2023-08-30 ENCOUNTER — Other Ambulatory Visit: Payer: Self-pay

## 2023-08-30 DIAGNOSIS — F320 Major depressive disorder, single episode, mild: Secondary | ICD-10-CM | POA: Diagnosis not present

## 2023-08-30 DIAGNOSIS — F411 Generalized anxiety disorder: Secondary | ICD-10-CM | POA: Diagnosis not present

## 2023-09-06 ENCOUNTER — Other Ambulatory Visit (INDEPENDENT_AMBULATORY_CARE_PROVIDER_SITE_OTHER): Payer: Self-pay | Admitting: Primary Care

## 2023-09-10 ENCOUNTER — Other Ambulatory Visit: Payer: Self-pay | Admitting: Primary Care

## 2023-09-10 DIAGNOSIS — Z1231 Encounter for screening mammogram for malignant neoplasm of breast: Secondary | ICD-10-CM

## 2023-09-13 ENCOUNTER — Ambulatory Visit (INDEPENDENT_AMBULATORY_CARE_PROVIDER_SITE_OTHER): Payer: Medicaid Other | Admitting: Primary Care

## 2023-09-13 ENCOUNTER — Encounter (INDEPENDENT_AMBULATORY_CARE_PROVIDER_SITE_OTHER): Payer: Self-pay | Admitting: Primary Care

## 2023-09-13 VITALS — BP 122/74 | HR 94 | Resp 18 | Wt 381.2 lb

## 2023-09-13 DIAGNOSIS — E119 Type 2 diabetes mellitus without complications: Secondary | ICD-10-CM | POA: Diagnosis not present

## 2023-09-13 DIAGNOSIS — M62838 Other muscle spasm: Secondary | ICD-10-CM

## 2023-09-13 DIAGNOSIS — I5032 Chronic diastolic (congestive) heart failure: Secondary | ICD-10-CM

## 2023-09-13 DIAGNOSIS — G4733 Obstructive sleep apnea (adult) (pediatric): Secondary | ICD-10-CM | POA: Diagnosis not present

## 2023-09-13 DIAGNOSIS — E118 Type 2 diabetes mellitus with unspecified complications: Secondary | ICD-10-CM

## 2023-09-13 DIAGNOSIS — Z7984 Long term (current) use of oral hypoglycemic drugs: Secondary | ICD-10-CM

## 2023-09-13 DIAGNOSIS — Z0130 Encounter for examination of blood pressure without abnormal findings: Secondary | ICD-10-CM | POA: Diagnosis not present

## 2023-09-13 DIAGNOSIS — R030 Elevated blood-pressure reading, without diagnosis of hypertension: Secondary | ICD-10-CM | POA: Diagnosis not present

## 2023-09-13 DIAGNOSIS — Z6841 Body Mass Index (BMI) 40.0 and over, adult: Secondary | ICD-10-CM | POA: Diagnosis not present

## 2023-09-13 DIAGNOSIS — I10 Essential (primary) hypertension: Secondary | ICD-10-CM | POA: Diagnosis not present

## 2023-09-13 LAB — POCT GLYCOSYLATED HEMOGLOBIN (HGB A1C): HbA1c, POC (controlled diabetic range): 9.1 % — AB (ref 0.0–7.0)

## 2023-09-13 MED ORDER — METHOCARBAMOL 750 MG PO TABS: 750.0000 mg | ORAL_TABLET | Freq: Three times a day (TID) | ORAL | 1 refills | Status: DC | PRN

## 2023-09-13 MED ORDER — METHOCARBAMOL 500 MG PO TABS: 500.0000 mg | ORAL_TABLET | Freq: Three times a day (TID) | ORAL | 1 refills | Status: DC | PRN

## 2023-09-13 NOTE — Progress Notes (Signed)
[facility]  Lindsay [name], is a 42 y.o. female  ZOX:[id number]  [id number]:[id number]  DOB - August 03, 1981  Chief Complaint  Patient presents with   Diabetes   Hyperlipidemia       Subjective:   [name] is a 42 y.o. female here today for a follow up visit T2D. Denies polyuria, polydipsia, polyphasia or vision changes.  Does yes check blood sugars at home. Dexcom- 85-365. She is managed by endocrinology for her diabetes and will no longer follow. Patient has No headache, No chest pain, No abdominal pain - No Nausea, No new weakness tingling or numbness, No Cough - shortness of breath. Patient in today to discuss where she was in the process of bariatric surgery to keep PCP updated. She does c/o of muscle spasm in her right arm and back. Quad cane for stability and abnormal gait  No problems updated.  Allergies  Allergen Reactions   Acetaminophen Itching and Swelling    Itching of the mouth, swelling of tongue and stomach started hurting mild   Hydrazine Yellow [Fd&C Yellow #5 (Tartrazine)] Shortness Of Breath and Swelling    Swelling mostly noticed in legs and feet, retaining urination, shortness of breaht, and minor chest pain   Lisinopril Shortness Of Breath    Was on prinzide; had sob/chest pain on it.   Pineapple Itching    Past Medical History:  Diagnosis Date   ARF (acute renal failure) (HCC) 04/2015   Asthma    Cellulitis of right upper extremity    Coronary artery disease    Diabetes mellitus    insulin dependent   Hyperlipidemia LDL goal <70    Hypertension    Obesity    S/P angioplasty with stent 08/2016   DES to mLAD and PTCA only to 2nd diag ostium.     Current Outpatient Medications on File Prior to Visit  Medication Sig Dispense Refill   Accu-Chek FastClix Lancets MISC USE 1 TO CHECK GLUCOSE 4 TIMES DAILY 102 each 2   acetaminophen (TYLENOL) 325 MG tablet Take 2 tablets (650 mg total) by mouth every 6 (six) hours as needed for mild  pain, fever or headache. 30 tablet 0   albuterol (VENTOLIN HFA) 108 (90 Base) MCG/ACT inhaler Inhale 2 puffs into the lungs every 6 (six) hours as needed for wheezing or shortness of breath. 18 g 1   allopurinol (ZYLOPRIM) 100 MG tablet Take 100 mg by mouth daily.     aspirin 81 MG tablet Take 1 tablet (81 mg total) by mouth daily. 30 tablet 11   atorvastatin (LIPITOR) 20 MG tablet TAKE 1 TABLET(20 MG) BY MOUTH DAILY 90 tablet 3   BD PEN NEEDLE NANO 2ND GEN 32G X 4 MM MISC Use to inject into the skin in the morning, at noon, and at bedtime as directed. 100 each 11   BD VEO INSULIN SYRINGE U/F 31G X 15/64" 1 ML MISC USE  SYRINGE ONCE DAILY 100 each 1   Blood Glucose Monitoring Suppl (ACCU-CHEK AVIVA PLUS) w/Device KIT 1 each by Does not apply route as directed. 1 kit 0   Continuous Blood Gluc Transmit (DEXCOM G6 TRANSMITTER) MISC Inject into the skin.     DULoxetine (CYMBALTA) 30 MG capsule TAKE 1 CAPSULE(30 MG) BY MOUTH DAILY 90 capsule 1   fluticasone (FLOVENT HFA) 110 MCG/ACT inhaler Inhale 2 puffs into the lungs 2 (two) times daily. 1 each 12   gabapentin (NEURONTIN) 300 MG capsule TAKE 2 CAPSULES(600 MG) BY MOUTH THREE TIMES  DAILY 180 capsule 2   glucose blood (ACCU-CHEK AVIVA PLUS) test strip Use as instructed to test blood sugar 4 times daily. 100 each 12   HUMULIN R U-500 KWIKPEN 500 UNIT/ML KwikPen Inject 75-80 Units into the skin 3 (three) times daily with meals. AM 80 units Noon 75 units Evening 75 units If BG above 200 add 5 units to prescribed dose 3 mL 3   hydrALAZINE (APRESOLINE) 100 MG tablet TAKE 1 TABLET(100 MG) BY MOUTH THREE TIMES DAILY 120 tablet 1   Insulin Pen Needle (B-D UF III MINI PEN NEEDLES) 31G X 5 MM MISC Use as instructed. Monitor blood glucose levels three times per day 90 each 1   isosorbide mononitrate (IMDUR) 60 MG 24 hr tablet Take 1 tablet (60 mg total) by mouth daily. 90 tablet 3   losartan (COZAAR) 100 MG tablet Take 1 tablet (100 mg total) by mouth daily. 90  tablet 3   Melatonin 10 MG TABS Take 3 tablets by mouth at bedtime as needed (sleep). 90 tablet 1   Multiple Vitamins-Minerals (MULTIVITAMIN PO) Take 1 tablet by mouth daily.     naloxone (NARCAN) nasal spray 4 mg/0.1 mL Place into both nostrils as needed (over dose).     nitroGLYCERIN (NITROSTAT) 0.4 MG SL tablet Place 1 tablet (0.4 mg total) under the tongue every 5 (five) minutes as needed for chest pain. Please keep upcoming appt in October 2022. Final Attempt 25 tablet 2   oxyCODONE-acetaminophen (PERCOCET) 10-325 MG tablet Take 1 tablet by mouth 3 (three) times daily as needed.     potassium chloride SA (KLOR-CON) 20 MEQ tablet Take 1 tablet (20 mEq total) by mouth daily. 90 tablet 3   Semaglutide, 1 MG/DOSE, (OZEMPIC, 1 MG/DOSE,) 2 MG/1.5ML SOPN Inject 1 mg into the skin once a week.     sodium chloride (OCEAN) 0.65 % nasal spray Place 1 spray into the nose as needed for congestion.     torsemide (DEMADEX) 20 MG tablet Take 2 tablets (40 mg total) by mouth 2 (two) times daily. 360 tablet 3   diclofenac Sodium (VOLTAREN) 1 % GEL Apply 4 g topically 4 (four) times daily. (Patient not taking: Reported on 09/13/2023) 350 g 1   Vitamin D, Ergocalciferol, (DRISDOL) 1.25 MG (50000 UNIT) CAPS capsule Take 50,000 Units by mouth once a week. (Patient not taking: Reported on 09/13/2023)     No current facility-administered medications on file prior to visit.    Objective:   Vitals:   09/13/23 1135 09/13/23 1139 09/13/23 1223  BP: (!) 150/79 (!) 159/81 122/74  Pulse: 95 94   Resp: 18 18   SpO2: 97% 97%   Weight: (!) 381 lb 3.2 oz (172.9 kg)      Comprehensive ROS Pertinent positive and negative noted in HPI   Exam General appearance : Awake, alert, not in any distress. Speech Clear. Not toxic looking HEENT: Atraumatic and Normocephalic, pupils equally reactive to light and accomodation Neck: Supple, no JVD. No cervical lymphadenopathy.  Chest: Good air entry bilaterally, no added sounds   CVS: S1 S2 regular, no murmurs.  Abdomen: Bowel sounds present, Non tender and not distended with no gaurding, rigidity or rebound. Extremities: B/L Lower Ext shows no edema, both legs are warm to touch Neurology: Awake alert, and oriented X 3, CN II-XII intact, Non focal Skin: No Rash  Data Review Lab Results  Component Value Date   HGBA1C 9.1 (A) 09/13/2023   HGBA1C 7.6 (A) 03/11/2023   HGBA1C  10.0 (H) 08/15/2022    Assessment & Plan   Lindsay [name] was seen today for diabetes and hyperlipidemia.  Diagnoses and all orders for this visit:  Diabetes mellitus with complication (HCC) -     POCT glycosylated hemoglobin (Hb A1C) 9.1 A1C followed by endocrinologist   Muscle spasm Shoulder and back methocarbamol increased to 750mg  TID prn  Chronic heart failure with preserved ejection fraction [facility]) Managed by cardiology    Patient have been counseled extensively about nutrition and exercise. Other issues discussed during this visit include: low cholesterol diet, weight control and daily exercise, foot care, annual eye examinations at Ophthalmology, importance of adherence with medications and regular follow-up. We also discussed long term complications of uncontrolled diabetes and hypertension.   Return in about 6 months (around 03/12/2024) for medical conditions.  The patient was given clear instructions to go to ER or return to medical center if symptoms don't improve, worsen or new problems develop. The patient verbalized understanding. The patient was told to call to get lab results if they haven't heard anything in the next week.   This note has been created with Education officer, environmental. Any transcriptional errors are unintentional.   Grayce Sessions, NP 09/13/2023, 1:10 PM

## 2023-09-14 DIAGNOSIS — Z79899 Other long term (current) drug therapy: Secondary | ICD-10-CM | POA: Diagnosis not present

## 2023-09-15 DIAGNOSIS — E1165 Type 2 diabetes mellitus with hyperglycemia: Secondary | ICD-10-CM | POA: Diagnosis not present

## 2023-09-15 DIAGNOSIS — Z794 Long term (current) use of insulin: Secondary | ICD-10-CM | POA: Diagnosis not present

## 2023-09-22 DIAGNOSIS — G4733 Obstructive sleep apnea (adult) (pediatric): Secondary | ICD-10-CM | POA: Diagnosis not present

## 2023-09-28 ENCOUNTER — Emergency Department (HOSPITAL_COMMUNITY): Payer: Medicaid Other

## 2023-09-28 ENCOUNTER — Other Ambulatory Visit: Payer: Self-pay

## 2023-09-28 ENCOUNTER — Emergency Department (HOSPITAL_COMMUNITY)
Admission: EM | Admit: 2023-09-28 | Discharge: 2023-09-28 | Disposition: A | Discharge status: Home / Self Care | Payer: Medicaid Other | Attending: Emergency Medicine | Admitting: Emergency Medicine

## 2023-09-28 DIAGNOSIS — J069 Acute upper respiratory infection, unspecified: Secondary | ICD-10-CM | POA: Diagnosis not present

## 2023-09-28 DIAGNOSIS — Z794 Long term (current) use of insulin: Secondary | ICD-10-CM | POA: Diagnosis not present

## 2023-09-28 DIAGNOSIS — E119 Type 2 diabetes mellitus without complications: Secondary | ICD-10-CM | POA: Diagnosis not present

## 2023-09-28 DIAGNOSIS — I10 Essential (primary) hypertension: Secondary | ICD-10-CM | POA: Insufficient documentation

## 2023-09-28 DIAGNOSIS — Z7982 Long term (current) use of aspirin: Secondary | ICD-10-CM | POA: Diagnosis not present

## 2023-09-28 DIAGNOSIS — Z79899 Other long term (current) drug therapy: Secondary | ICD-10-CM | POA: Diagnosis not present

## 2023-09-28 DIAGNOSIS — I2510 Atherosclerotic heart disease of native coronary artery without angina pectoris: Secondary | ICD-10-CM | POA: Diagnosis not present

## 2023-09-28 DIAGNOSIS — J45909 Unspecified asthma, uncomplicated: Secondary | ICD-10-CM | POA: Diagnosis not present

## 2023-09-28 DIAGNOSIS — B9789 Other viral agents as the cause of diseases classified elsewhere: Secondary | ICD-10-CM | POA: Diagnosis not present

## 2023-09-28 DIAGNOSIS — R112 Nausea with vomiting, unspecified: Secondary | ICD-10-CM | POA: Insufficient documentation

## 2023-09-28 DIAGNOSIS — Z20822 Contact with and (suspected) exposure to covid-19: Secondary | ICD-10-CM | POA: Diagnosis not present

## 2023-09-28 DIAGNOSIS — R059 Cough, unspecified: Secondary | ICD-10-CM | POA: Diagnosis not present

## 2023-09-28 LAB — URINALYSIS, ROUTINE W REFLEX MICROSCOPIC
Bilirubin Urine: NEGATIVE
Glucose, UA: >=500 mg/dL — AB
Ketones, ur: NEGATIVE mg/dL
Leukocytes,Ua: NEGATIVE
Nitrite: NEGATIVE
Protein, ur: >=300 mg/dL — AB
Specific Gravity, Urine: 1.02 (ref 1.005–1.030)
pH: 5 (ref 5.0–8.0)

## 2023-09-28 LAB — CBC
HCT: 32 % — ABNORMAL LOW (ref 36.0–46.0)
Hemoglobin: 9.7 g/dL — ABNORMAL LOW (ref 12.0–15.0)
MCH: 24.9 pg — ABNORMAL LOW (ref 26.0–34.0)
MCHC: 30.3 g/dL (ref 30.0–36.0)
MCV: 82.1 fL (ref 80.0–100.0)
Platelets: 237 10*3/uL (ref 150–400)
RBC: 3.9 MIL/uL (ref 3.87–5.11)
RDW: 15.1 % (ref 11.5–15.5)
WBC: 6.8 10*3/uL (ref 4.0–10.5)
nRBC: 0 % (ref 0.0–0.2)

## 2023-09-28 LAB — COMPREHENSIVE METABOLIC PANEL
ALT: 16 U/L (ref 0–44)
AST: 23 U/L (ref 15–41)
Albumin: 3 g/dL — ABNORMAL LOW (ref 3.5–5.0)
Alkaline Phosphatase: 58 U/L (ref 38–126)
Anion gap: 10 (ref 5–15)
BUN: 8 mg/dL (ref 6–20)
CO2: 21 mmol/L — ABNORMAL LOW (ref 22–32)
Calcium: 8.8 mg/dL — ABNORMAL LOW (ref 8.9–10.3)
Chloride: 102 mmol/L (ref 98–111)
Creatinine, Ser: 0.81 mg/dL (ref 0.44–1.00)
GFR, Estimated: >60 mL/min (ref >60)
Glucose, Bld: 228 mg/dL — ABNORMAL HIGH (ref 70–99)
Potassium: 4.1 mmol/L (ref 3.5–5.1)
Sodium: 133 mmol/L — ABNORMAL LOW (ref 135–145)
Total Bilirubin: 0.4 mg/dL (ref 0.3–1.2)
Total Protein: 6.8 g/dL (ref 6.5–8.1)

## 2023-09-28 LAB — RESP PANEL BY RT-PCR (RSV, FLU A&B, COVID)  RVPGX2
Influenza A by PCR: NEGATIVE
Influenza B by PCR: NEGATIVE
Resp Syncytial Virus by PCR: NEGATIVE
SARS Coronavirus 2 by RT PCR: NEGATIVE

## 2023-09-28 LAB — SARS CORONAVIRUS 2 BY RT PCR: SARS Coronavirus 2 by RT PCR: NEGATIVE

## 2023-09-28 LAB — LIPASE, BLOOD: Lipase: 34 U/L (ref 11–51)

## 2023-09-28 LAB — HCG, SERUM, QUALITATIVE: Preg, Serum: NEGATIVE

## 2023-09-28 MED ORDER — DEXTROMETHORPHAN-GUAIFENESIN 10-100 MG/5ML PO LIQD: 10.0000 mL | ORAL | 0 refills | Status: DC | PRN

## 2023-09-28 MED ORDER — ONDANSETRON 4 MG PO TBDP: 4.0000 mg | ORAL_TABLET | Freq: Three times a day (TID) | ORAL | 0 refills | Status: DC | PRN

## 2023-09-28 MED ORDER — ONDANSETRON 4 MG PO TBDP
ORAL_TABLET | ORAL | Status: AC
  Filled 2023-09-28: qty 1

## 2023-09-28 MED ORDER — ONDANSETRON 4 MG PO TBDP
4.0000 mg | ORAL_TABLET | Freq: Once | ORAL | Status: AC | PRN
  Administered 2023-09-28: 4 mg via ORAL

## 2023-09-28 MED ORDER — KETOROLAC TROMETHAMINE 30 MG/ML IJ SOLN
30.0000 mg | Freq: Once | INTRAMUSCULAR | Status: AC
  Administered 2023-09-28: 30 mg via INTRAMUSCULAR
  Filled 2023-09-28: qty 1

## 2023-09-28 NOTE — ED Triage Notes (Signed)
Patient reports coughing since Friday and one day of loose stools, vomiting, abdominal pain, generalized body aches, and headache. Works at a daycare.

## 2023-09-28 NOTE — ED Provider Notes (Signed)
[place] EMERGENCY DEPARTMENT AT [facility] Provider Note   CSN: [id number] Arrival date & time: 09/28/23  1191     History  Chief Complaint  Patient presents with   Cough   Emesis    Lindsay [name] is a 42 y.o. female.  Pt is a 42 yo female with pmhx significant for htn, asthma, obesity, CAD, hld, and DM2.  Pt said she has had coughing since Friday (10/4).  She has developed n/v as well.  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  She has been taking Nyquil for the cough.  She did eat chips while waiting to be seen and kept them down.       Home Medications Prior to Admission medications   Medication Sig Start Date End Date Taking? Authorizing Provider  dextromethorphan-guaiFENesin (TUSSIN DM) 10-100 MG/5ML liquid Take 10 mLs by mouth every 4 (four) hours as needed for cough. 09/28/23  Yes Jacalyn Lefevre, MD  ondansetron (ZOFRAN-ODT) 4 MG disintegrating tablet Take 1 tablet (4 mg total) by mouth every 8 (eight) hours as needed. 09/28/23  Yes Jacalyn Lefevre, MD  Accu-Chek FastClix Lancets MISC USE 1 TO CHECK GLUCOSE 4 TIMES DAILY 01/26/20   Hoy Register, MD  acetaminophen (TYLENOL) 325 MG tablet Take 2 tablets (650 mg total) by mouth every 6 (six) hours as needed for mild pain, fever or headache. 08/23/22   Drema Dallas, MD  albuterol (VENTOLIN HFA) 108 (90 Base) MCG/ACT inhaler Inhale 2 puffs into the lungs every 6 (six) hours as needed for wheezing or shortness of breath. 08/28/21   Grayce Sessions, NP  allopurinol (ZYLOPRIM) 100 MG tablet Take 100 mg by mouth daily.    [provider]  aspirin 81 MG tablet Take 1 tablet (81 mg total) by mouth daily. 01/13/18   Loletta Specter, PA-C  atorvastatin (LIPITOR) 20 MG tablet TAKE 1 TABLET(20 MG) BY MOUTH DAILY 10/16/22   Grayce Sessions, NP  BD PEN NEEDLE NANO 2ND GEN 32G X 4 MM MISC Use to inject into the skin in the morning, at noon, and at bedtime as directed. 03/02/23   Grayce Sessions,  NP  BD VEO INSULIN SYRINGE U/F 31G X 15/64" 1 ML MISC USE  SYRINGE ONCE DAILY 06/07/18   Reather Littler, MD  Blood Glucose Monitoring Suppl (ACCU-CHEK AVIVA PLUS) w/Device KIT 1 each by Does not apply route as directed. 04/15/18   Reather Littler, MD  Continuous Blood Gluc Transmit (DEXCOM G6 TRANSMITTER) MISC Inject into the skin. 06/09/21   [provider]  diclofenac Sodium (VOLTAREN) 1 % GEL Apply 4 g topically 4 (four) times daily. Patient not taking: Reported on 09/13/2023 12/03/21   Grayce Sessions, NP  DULoxetine (CYMBALTA) 30 MG capsule TAKE 1 CAPSULE(30 MG) BY MOUTH DAILY 07/23/23   Grayce Sessions, NP  fluticasone (FLOVENT HFA) 110 MCG/ACT inhaler Inhale 2 puffs into the lungs 2 (two) times daily. 08/28/21   Grayce Sessions, NP  gabapentin (NEURONTIN) 300 MG capsule TAKE 2 CAPSULES(600 MG) BY MOUTH THREE TIMES DAILY 08/27/23   Grayce Sessions, NP  glucose blood (ACCU-CHEK AVIVA PLUS) test strip Use as instructed to test blood sugar 4 times daily. 01/11/19   [name], NP  HUMULIN R U-500 KWIKPEN 500 UNIT/ML KwikPen Inject 75-80 Units into the skin 3 (three) times daily with meals. AM 80 units Noon 75 units Evening 75 units If BG above 200 add 5 units to  prescribed dose 10/23/22   Grayce Sessions, NP  hydrALAZINE (APRESOLINE) 100 MG tablet TAKE 1 TABLET(100 MG) BY MOUTH THREE TIMES DAILY 01/20/23   Grayce Sessions, NP  Insulin Pen Needle (B-D UF III MINI PEN NEEDLES) 31G X 5 MM MISC Use as instructed. Monitor blood glucose levels three times per day 04/13/19   Grayce Sessions, NP  isosorbide mononitrate (IMDUR) 60 MG 24 hr tablet Take 1 tablet (60 mg total) by mouth daily. 03/09/23   Christell Constant, MD  losartan (COZAAR) 100 MG tablet Take 1 tablet (100 mg total) by mouth daily. 03/09/23   Chandrasekhar, Rondel Jumbo, MD  Melatonin 10 MG TABS Take 3 tablets by mouth at bedtime as needed (sleep). 09/25/22   Grayce Sessions, NP  methocarbamol (ROBAXIN) 750 MG  tablet Take 1 tablet (750 mg total) by mouth every 8 (eight) hours as needed. 09/13/23   Grayce Sessions, NP  Multiple Vitamins-Minerals (MULTIVITAMIN PO) Take 1 tablet by mouth daily.    [provider]  naloxone [facility]) nasal spray 4 mg/0.1 mL Place into both nostrils as needed (over dose). 10/06/22   [provider]  nitroGLYCERIN (NITROSTAT) 0.4 MG SL tablet Place 1 tablet (0.4 mg total) under the tongue every 5 (five) minutes as needed for chest pain. Please keep upcoming appt in October 2022. Final Attempt 06/19/21   Lyn Records, MD  oxyCODONE-acetaminophen (PERCOCET) 10-325 MG tablet Take 1 tablet by mouth 3 (three) times daily as needed. 01/16/23   [provider]  potassium chloride SA (KLOR-CON) 20 MEQ tablet Take 1 tablet (20 mEq total) by mouth daily. 08/15/21   Lyn Records, MD  Semaglutide, 1 MG/DOSE, (OZEMPIC, 1 MG/DOSE,) 2 MG/1.5ML SOPN Inject 1 mg into the skin once a week.    [provider]  sodium chloride (OCEAN) 0.65 % nasal spray Place 1 spray into the nose as needed for congestion. 02/19/17   [provider]  torsemide (DEMADEX) 20 MG tablet Take 2 tablets (40 mg total) by mouth 2 (two) times daily. 05/20/23   Chandrasekhar, Rondel Jumbo, MD  Vitamin D, Ergocalciferol, (DRISDOL) 1.25 MG (50000 UNIT) CAPS capsule Take 50,000 Units by mouth once a week. Patient not taking: Reported on 09/13/2023    [provider]      Allergies    Acetaminophen, Hydrazine yellow [fd&c yellow #5 (tartrazine)], Lisinopril, and Pineapple    Review of Systems   Review of Systems  HENT:  Positive for rhinorrhea.   Respiratory:  Positive for cough.   Gastrointestinal:  Positive for nausea and vomiting.  All other systems reviewed and are negative.   Physical Exam Updated Vital Signs BP (!) 141/86 (BP Location: Right Arm)   Pulse 72   Temp 98.3 F (36.8 C) (Oral)   Resp 18   Ht 5' 7.5" (1.715 m)   Wt (!) 173.7 kg   LMP 08/03/2023  (Approximate)   SpO2 99%   BMI 59.10 kg/m  Physical Exam Vitals and nursing note reviewed.  Constitutional:      Appearance: Normal appearance. She is obese.  HENT:     Head: Normocephalic and atraumatic.     Right Ear: External ear normal.     Left Ear: External ear normal.     Nose: Rhinorrhea present.     Mouth/Throat:     Mouth: Mucous membranes are moist.     Pharynx: Oropharynx is clear.  Eyes:     Extraocular Movements: Extraocular movements intact.  Conjunctiva/sclera: Conjunctivae normal.     Pupils: Pupils are equal, round, and reactive to light.  Cardiovascular:     Rate and Rhythm: Normal rate and regular rhythm.     Pulses: Normal pulses.     Heart sounds: Normal heart sounds.  Pulmonary:     Effort: Pulmonary effort is normal.     Breath sounds: Normal breath sounds.  Abdominal:     General: Abdomen is flat. Bowel sounds are normal.     Palpations: Abdomen is soft.  Musculoskeletal:        General: Normal range of motion.     Cervical back: Normal range of motion and neck supple.  Skin:    General: Skin is warm.     Capillary Refill: Capillary refill takes less than 2 seconds.  Neurological:     General: No focal deficit present.     Mental Status: She is alert and oriented to person, place, and time.  Psychiatric:        Mood and Affect: Mood normal.        Behavior: Behavior normal.        Thought Content: Thought content normal.        Judgment: Judgment normal.     ED Results / Procedures / Treatments   Labs (all labs ordered are listed, but only abnormal results are displayed) Labs Reviewed  COMPREHENSIVE METABOLIC PANEL - Abnormal; Notable for the following components:      Result Value   Sodium 133 (*)    CO2 21 (*)    Glucose, Bld 228 (*)    Calcium 8.8 (*)    Albumin 3.0 (*)    All other components within normal limits  CBC - Abnormal; Notable for the following components:   Hemoglobin 9.7 (*)    HCT 32.0 (*)    MCH 24.9 (*)     All other components within normal limits  SARS CORONAVIRUS 2 BY RT PCR  RESP PANEL BY RT-PCR (RSV, FLU A&B, COVID)  [id number]  LIPASE, BLOOD  HCG, SERUM, QUALITATIVE  URINALYSIS, ROUTINE W REFLEX MICROSCOPIC    EKG None  Radiology DG Chest 2 View  Result Date: 09/28/2023 CLINICAL DATA:  Cough with nausea and vomiting. EXAM: CHEST - 2 VIEW COMPARISON:  August 14, 2022 FINDINGS: The heart size and mediastinal contours are within normal limits. Both lungs are clear. The visualized skeletal structures are unremarkable. IMPRESSION: No active cardiopulmonary disease. Electronically Signed   By: Aram Candela M.D.   On: 09/28/2023 15:24    Procedures Procedures    Medications Ordered in ED Medications  ondansetron (ZOFRAN-ODT) disintegrating tablet 4 mg (4 mg Oral Given 09/28/23 0923)  ketorolac (TORADOL) 30 MG/ML injection 30 mg (30 mg Intramuscular Given 09/28/23 1232)    ED Course/ Medical Decision Making/ A&P                                 Medical Decision Making Amount and/or Complexity of Data Reviewed Labs: ordered. Radiology: ordered.  Risk OTC drugs. Prescription drug management.   This patient presents to the ED for concern of cough, this involves an extensive number of treatment options, and is a complaint that carries with it a high risk of complications and morbidity.  The differential diagnosis includes covid/flu/rsv, pna, uri   Co morbidities that complicate the patient evaluation  htn, asthma, obesity, CAD, hld, and DM2   Additional history obtained:  Additional history obtained from epic chart review External records from outside source obtained and reviewed including family   Lab Tests:  I Ordered, and personally interpreted labs.  The pertinent results include:  preg neg, cmp nl other than glucose elevated at 228 (pt has not taken am meds), cbc with hgb 9.7 (stable), covid neg/flu neg/rsv neg   Imaging Studies ordered:  I ordered imaging  studies including cxr  I independently visualized and interpreted imaging which showed No active cardiopulmonary disease.  I agree with the radiologist interpretation   Cardiac Monitoring:  The patient was maintained on a cardiac monitor.  I personally viewed and interpreted the cardiac monitored which showed an underlying rhythm of: nsr   Medicines ordered and prescription drug management:  I ordered medication including zofran/toradol  for sx  Reevaluation of the patient after these medicines showed that the patient improved I have reviewed the patients home medicines and have made adjustments as needed    Problem List / ED Course:  URI:  pt is instructed to return if worse. F/u with pcp. N/v:  pt tolerating po fluids.     Reevaluation:  After the interventions noted above, I reevaluated the patient and found that they have :improved   Social Determinants of Health:  Lives at home   Dispostion:  After consideration of the diagnostic results and the patients response to treatment, I feel that the patent would benefit from discharge with outpatient f/u.          Final Clinical Impression(s) / ED Diagnoses Final diagnoses:  Viral upper respiratory tract infection  Nausea and vomiting, unspecified vomiting type    Rx / DC Orders ED Discharge Orders          Ordered    ondansetron (ZOFRAN-ODT) 4 MG disintegrating tablet  Every 8 hours PRN        09/28/23 1443    dextromethorphan-guaiFENesin (TUSSIN DM) 10-100 MG/5ML liquid  Every 4 hours PRN        09/28/23 1443              Jacalyn Lefevre, MD 09/28/23 1528

## 2023-10-05 ENCOUNTER — Ambulatory Visit
Admission: RE | Admit: 2023-10-05 | Discharge: 2023-10-05 | Disposition: A | Discharge status: Home / Self Care | Payer: Medicaid Other | Source: Ambulatory Visit | Attending: Primary Care | Admitting: Primary Care

## 2023-10-05 DIAGNOSIS — Z1231 Encounter for screening mammogram for malignant neoplasm of breast: Secondary | ICD-10-CM | POA: Diagnosis not present

## 2023-10-13 DIAGNOSIS — I10 Essential (primary) hypertension: Secondary | ICD-10-CM | POA: Diagnosis not present

## 2023-10-13 DIAGNOSIS — Z0130 Encounter for examination of blood pressure without abnormal findings: Secondary | ICD-10-CM | POA: Diagnosis not present

## 2023-10-13 DIAGNOSIS — Z6841 Body Mass Index (BMI) 40.0 and over, adult: Secondary | ICD-10-CM | POA: Diagnosis not present

## 2023-10-13 DIAGNOSIS — E611 Iron deficiency: Secondary | ICD-10-CM | POA: Diagnosis not present

## 2023-10-13 DIAGNOSIS — R030 Elevated blood-pressure reading, without diagnosis of hypertension: Secondary | ICD-10-CM | POA: Diagnosis not present

## 2023-10-13 DIAGNOSIS — G4733 Obstructive sleep apnea (adult) (pediatric): Secondary | ICD-10-CM | POA: Diagnosis not present

## 2023-10-13 DIAGNOSIS — Z760 Encounter for issue of repeat prescription: Secondary | ICD-10-CM | POA: Diagnosis not present

## 2023-10-13 DIAGNOSIS — E559 Vitamin D deficiency, unspecified: Secondary | ICD-10-CM | POA: Diagnosis not present

## 2023-10-13 DIAGNOSIS — E119 Type 2 diabetes mellitus without complications: Secondary | ICD-10-CM | POA: Diagnosis not present

## 2023-10-19 DIAGNOSIS — Z79899 Other long term (current) drug therapy: Secondary | ICD-10-CM | POA: Diagnosis not present

## 2023-10-23 DIAGNOSIS — G4733 Obstructive sleep apnea (adult) (pediatric): Secondary | ICD-10-CM | POA: Diagnosis not present

## 2023-11-09 ENCOUNTER — Ambulatory Visit (INDEPENDENT_AMBULATORY_CARE_PROVIDER_SITE_OTHER): Payer: Medicaid Other

## 2023-11-09 ENCOUNTER — Ambulatory Visit (INDEPENDENT_AMBULATORY_CARE_PROVIDER_SITE_OTHER): Payer: Medicaid Other | Admitting: Podiatry

## 2023-11-09 ENCOUNTER — Encounter: Payer: Self-pay | Admitting: Podiatry

## 2023-11-09 DIAGNOSIS — M79674 Pain in right toe(s): Secondary | ICD-10-CM

## 2023-11-09 DIAGNOSIS — E1142 Type 2 diabetes mellitus with diabetic polyneuropathy: Secondary | ICD-10-CM

## 2023-11-09 DIAGNOSIS — G4733 Obstructive sleep apnea (adult) (pediatric): Secondary | ICD-10-CM | POA: Diagnosis not present

## 2023-11-09 DIAGNOSIS — M79675 Pain in left toe(s): Secondary | ICD-10-CM

## 2023-11-09 DIAGNOSIS — M47816 Spondylosis without myelopathy or radiculopathy, lumbar region: Secondary | ICD-10-CM | POA: Diagnosis not present

## 2023-11-09 DIAGNOSIS — B351 Tinea unguium: Secondary | ICD-10-CM | POA: Diagnosis not present

## 2023-11-09 DIAGNOSIS — L97521 Non-pressure chronic ulcer of other part of left foot limited to breakdown of skin: Secondary | ICD-10-CM

## 2023-11-09 DIAGNOSIS — I10 Essential (primary) hypertension: Secondary | ICD-10-CM | POA: Diagnosis not present

## 2023-11-09 DIAGNOSIS — M25511 Pain in right shoulder: Secondary | ICD-10-CM | POA: Diagnosis not present

## 2023-11-09 DIAGNOSIS — E119 Type 2 diabetes mellitus without complications: Secondary | ICD-10-CM | POA: Diagnosis not present

## 2023-11-09 DIAGNOSIS — E11621 Type 2 diabetes mellitus with foot ulcer: Secondary | ICD-10-CM

## 2023-11-09 DIAGNOSIS — M109 Gout, unspecified: Secondary | ICD-10-CM | POA: Diagnosis not present

## 2023-11-09 MED ORDER — DOXYCYCLINE HYCLATE 100 MG PO CAPS
100.0000 mg | ORAL_CAPSULE | Freq: Two times a day (BID) | ORAL | 0 refills | Status: AC
Start: 2023-11-09 — End: 2023-11-19

## 2023-11-09 MED ORDER — GENTAMICIN SULFATE 0.1 % EX CREA
TOPICAL_CREAM | CUTANEOUS | 0 refills | Status: DC
Start: 2023-11-09 — End: 2024-04-13

## 2023-11-10 NOTE — Progress Notes (Unsigned)
Subjective: Patient presents today  Chief Complaint  Patient presents with   Diabetes    DFC BS - DIDN'T CHECK IT  A1C - 9.5 LVPCP - 08/2023    {jgcomplaint:23593}  New concern today: Location: {jgPodToeLocator:23637}    Lindsay [name], Lindsay Scales, NP is patient's PCP. Last visit was {DATE MONTH DAY KGUR:[id number]}.  Current Outpatient Medications on File Prior to Visit  Medication Sig Dispense Refill   Accu-Chek FastClix Lancets MISC USE 1 TO CHECK GLUCOSE 4 TIMES DAILY 102 each 2   acetaminophen (TYLENOL) 325 MG tablet Take 2 tablets (650 mg total) by mouth every 6 (six) hours as needed for mild pain, fever or headache. 30 tablet 0   albuterol (VENTOLIN HFA) 108 (90 Base) MCG/ACT inhaler Inhale 2 puffs into the lungs every 6 (six) hours as needed for wheezing or shortness of breath. 18 g 1   allopurinol (ZYLOPRIM) 100 MG tablet Take 100 mg by mouth daily.     aspirin 81 MG tablet Take 1 tablet (81 mg total) by mouth daily. 30 tablet 11   atorvastatin (LIPITOR) 20 MG tablet TAKE 1 TABLET(20 MG) BY MOUTH DAILY 90 tablet 3   BD PEN NEEDLE NANO 2ND GEN 32G X 4 MM MISC Use to inject into the skin in the morning, at noon, and at bedtime as directed. 100 each 11   BD VEO INSULIN SYRINGE U/F 31G X 15/64" 1 ML MISC USE  SYRINGE ONCE DAILY 100 each 1   Blood Glucose Monitoring Suppl (ACCU-CHEK AVIVA PLUS) w/Device KIT 1 each by Does not apply route as directed. 1 kit 0   Continuous Blood Gluc Transmit (DEXCOM G6 TRANSMITTER) MISC Inject into the skin.     dextromethorphan-guaiFENesin (TUSSIN DM) 10-100 MG/5ML liquid Take 10 mLs by mouth every 4 (four) hours as needed for cough. 180 mL 0   diclofenac Sodium (VOLTAREN) 1 % GEL Apply 4 g topically 4 (four) times daily. (Patient not taking: Reported on 09/13/2023) 350 g 1   DULoxetine (CYMBALTA) 30 MG capsule TAKE 1 CAPSULE(30 MG) BY MOUTH DAILY 90 capsule 1   fluticasone (FLOVENT HFA) 110 MCG/ACT inhaler Inhale 2 puffs into the lungs 2 (two) times  daily. 1 each 12   gabapentin (NEURONTIN) 300 MG capsule TAKE 2 CAPSULES(600 MG) BY MOUTH THREE TIMES DAILY 180 capsule 2   glucose blood (ACCU-CHEK AVIVA PLUS) test strip Use as instructed to test blood sugar 4 times daily. 100 each 12   HUMULIN R U-500 KWIKPEN 500 UNIT/ML KwikPen Inject 75-80 Units into the skin 3 (three) times daily with meals. AM 80 units Noon 75 units Evening 75 units If BG above 200 add 5 units to prescribed dose 3 mL 3   hydrALAZINE (APRESOLINE) 100 MG tablet TAKE 1 TABLET(100 MG) BY MOUTH THREE TIMES DAILY 120 tablet 1   Insulin Pen Needle (B-D UF III MINI PEN NEEDLES) 31G X 5 MM MISC Use as instructed. Monitor blood glucose levels three times per day 90 each 1   isosorbide mononitrate (IMDUR) 60 MG 24 hr tablet Take 1 tablet (60 mg total) by mouth daily. 90 tablet 3   losartan (COZAAR) 100 MG tablet Take 1 tablet (100 mg total) by mouth daily. 90 tablet 3   Melatonin 10 MG TABS Take 3 tablets by mouth at bedtime as needed (sleep). 90 tablet 1   methocarbamol (ROBAXIN) 750 MG tablet Take 1 tablet (750 mg total) by mouth every 8 (eight) hours as needed. 90 tablet 1   MOUNJARO 2.5  MG/0.5ML Pen Inject 2.5 mg into the skin once a week.     Multiple Vitamins-Minerals (MULTIVITAMIN PO) Take 1 tablet by mouth daily.     naloxone (NARCAN) nasal spray 4 mg/0.1 mL Place into both nostrils as needed (over dose).     nitroGLYCERIN (NITROSTAT) 0.4 MG SL tablet Place 1 tablet (0.4 mg total) under the tongue every 5 (five) minutes as needed for chest pain. Please keep upcoming appt in October 2022. Final Attempt 25 tablet 2   ondansetron (ZOFRAN-ODT) 4 MG disintegrating tablet Take 1 tablet (4 mg total) by mouth every 8 (eight) hours as needed. 20 tablet 0   oxyCODONE-acetaminophen (PERCOCET) 10-325 MG tablet Take 1 tablet by mouth 3 (three) times daily as needed.     potassium chloride SA (KLOR-CON) 20 MEQ tablet Take 1 tablet (20 mEq total) by mouth daily. 90 tablet 3   Semaglutide, 1  MG/DOSE, (OZEMPIC, 1 MG/DOSE,) 2 MG/1.5ML SOPN Inject 1 mg into the skin once a week. (Patient not taking: Reported on 11/09/2023)     sodium chloride (OCEAN) 0.65 % nasal spray Place 1 spray into the nose as needed for congestion.     torsemide (DEMADEX) 20 MG tablet Take 2 tablets (40 mg total) by mouth 2 (two) times daily. 360 tablet 3   Vitamin D, Ergocalciferol, (DRISDOL) 1.25 MG (50000 UNIT) CAPS capsule Take 50,000 Units by mouth once a week. (Patient not taking: Reported on 09/13/2023)     No current facility-administered medications on file prior to visit.     Allergies  Allergen Reactions   Acetaminophen Itching and Swelling    Itching of the mouth, swelling of tongue and stomach started hurting mild   Hydrazine Yellow [Fd&C Yellow #5 (Tartrazine)] Shortness Of Breath and Swelling    Swelling mostly noticed in legs and feet, retaining urination, shortness of breaht, and minor chest pain   Lisinopril Shortness Of Breath    Was on prinzide; had sob/chest pain on it.   Pineapple Itching     Objective: There were no vitals filed for this visit.  Lindsay [name] is a pleasant 42 y.o. female {jgbodyhabitus:24098}. AAO X 3.  Vascular Examination: {jgvascular:23595}  Dermatological Examination: {jgderm:23598}  Wound Location: left fifth digit There is a minimal amount of devitalized tissue present in the wound. Predebridement Wound Measurement:  0.5  x 0.5 cm with hyperpigmented hyperkeratotic roof Postdebridement Wound Measurement: 0.4 x 0.3 x 0.1 cm. Wound Base: {findings; base ulcer:11056} Peri-wound: {Peri-wound:10864} Exudate: {:10862} Blood Loss during debridement: {NUMBERS; 0-10:5044} cc('s). Material in wound which inhibits healing/promotes adjacent tissue breakdown:  {jgtissue:24134}. Description of tissue removed from ulceration today:  {jgtissue:24134}. Sign(s) of clinical bacterial infection: {JGSignsofInfection:24077}   Musculoskeletal  Examination: {jgmsk:23600}  Neurological Examination: {jgneuro:23601::"Protective sensation intact 5/5 intact bilaterally with 10g monofilament b/l.","Vibratory sensation intact b/l.","Proprioception intact bilaterally."}  Xray findings {jgPodToeLocator:23637}: {jgxrayfindings:23683}  Assessment and Plan 1. Pain due to onychomycosis of toenails of both feet ***  2. 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  Dispense: 20 capsule; Refill: 0 - gentamicin cream (GARAMYCIN) 0.1 %; Apply to left 5th toe once daily.  Dispense: 15 g; Refill: 0   -Patient was evaluated and treated and all questions answered.  -Patient/POA/Family member educated on diagnosis and treatment plan of routine  ulcer debridement/wound care.  -Xrays of {jgPodToeLocator:23637} taken in office and reviewed with patient. -Ulceration debridement achieved utilizing sharp excisional debridement to level of *** with {jgtools:23895}. Type/amount of devitalized tissue removed: {jgtissue:24134} -Today's ulcer size post-debridement: *** x *** x *** cm. -Ulceration cleansed with wound cleanser. {jgwcproduct:24043} applied to base of ulceration and secured with light dressing. -Wound responded well to today's debridement. -Patient risk factors affecting healing of ulcer: {jgriskfactors:24044} -Ermelinda D Huhn given written instructions on daily wound care for {jgPodToeLocator:23637} ulceration. -{jgrx:23616} -Frequency of debridements needed to achieve healing: weekly to biweekly. -Wound culture and sensitivity ordered today. -Dispensed {JGDME:24132} for {jgPodToeLocator:23637}. If required, financial form/ABN signed on today's visit. -Consultations ordered today: {JGSpecialistReferral:27127} -Orders for Home  Health Wound care to be faxed to: *** -Radiology ordered today: {Radiology:18555}. -{Jgplan:23602::"-Patient/POA to call should there be question/concern in the interim."}  Return in about 1 week (around 11/16/2023).  Freddie Breech, DPM      [place] LOCATION: [address] [place] [address]                   Office [phone]   [facility] LOCATION: [address] [place], [place] [address] Office [phone]

## 2023-11-12 DIAGNOSIS — Z79899 Other long term (current) drug therapy: Secondary | ICD-10-CM | POA: Diagnosis not present

## 2023-11-15 DIAGNOSIS — G5601 Carpal tunnel syndrome, right upper limb: Secondary | ICD-10-CM | POA: Diagnosis not present

## 2023-11-15 DIAGNOSIS — M25511 Pain in right shoulder: Secondary | ICD-10-CM | POA: Diagnosis not present

## 2023-11-15 DIAGNOSIS — M25531 Pain in right wrist: Secondary | ICD-10-CM | POA: Diagnosis not present

## 2023-11-17 ENCOUNTER — Ambulatory Visit: Payer: Medicaid Other | Admitting: Podiatry

## 2023-11-17 ENCOUNTER — Encounter: Payer: Self-pay | Admitting: Podiatry

## 2023-11-17 DIAGNOSIS — E11621 Type 2 diabetes mellitus with foot ulcer: Secondary | ICD-10-CM

## 2023-11-17 DIAGNOSIS — L97521 Non-pressure chronic ulcer of other part of left foot limited to breakdown of skin: Secondary | ICD-10-CM | POA: Diagnosis not present

## 2023-11-17 NOTE — Progress Notes (Signed)
  Subjective:  Patient ID: Lindsay [name], female    DOB: [birth year],   MRN: [id number]  No chief complaint on file.   42 y.o. female presents for concern of left fifth toe wound. Was seen by Dr. Eloy End last week for rfc and noted to have ulceration to lateral dorsal fifth digit. Has been dressing with gentamicin and been on doxycycline. Relates doing better.   . Denies any other pedal complaints. Denies n/v/f/c.   Past Medical History:  Diagnosis Date   ARF (acute renal failure) (HCC) 04/2015   Asthma    Cellulitis of right upper extremity    Coronary artery disease    Diabetes mellitus    insulin dependent   Hyperlipidemia LDL goal <70    Hypertension    Obesity    S/P angioplasty with stent 08/2016   DES to mLAD and PTCA only to 2nd diag ostium.     Objective:  Physical Exam: Vascular: DP/PT pulses 2/4 bilateral. CFT <3 seconds. Absent hair growth on digits. Edema noted to bilateral lower extremities. Xerosis noted bilaterally.  Skin. No lacerations or abrasions bilateral feet. Nails 1-5 bilateral  are thickened discolored and subungual debris. Lateral fifth digit hyperkeratosis with underlying ulceration healed.  Musculoskeletal: MMT 5/5 bilateral lower extremities in DF, PF, Inversion and Eversion. Deceased ROM in DF of ankle joint.  Neurological: Sensation intact to light touch. Protective sensation diminished bilateral.    Assessment:   1. 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 Ulcer left fifth digit- healed  -Debridement of hyperkeratotic tissue with underlying ulceration healed -Dressed toe cap -Still some darkening around toe. Finishe out antitbiotics.  -ABIs ordered to recheck vascular status.  -Off-loading with surgical shoe. -Discussed glucose control and proper protein-rich diet.  -Discussed if any worsening  redness, pain, fever or chills to call or may need to report to the emergency room. Patient expressed understanding.  Follow-up 4 weeks for recheck of wound.   No follow-ups on file.   Louann Sjogren, DPM

## 2023-11-24 LAB — HM DIABETES EYE EXAM

## 2023-11-25 DIAGNOSIS — F330 Major depressive disorder, recurrent, mild: Secondary | ICD-10-CM | POA: Diagnosis not present

## 2023-11-25 DIAGNOSIS — F419 Anxiety disorder, unspecified: Secondary | ICD-10-CM | POA: Diagnosis not present

## 2023-12-09 ENCOUNTER — Ambulatory Visit (HOSPITAL_COMMUNITY): Payer: Medicaid Other

## 2023-12-09 DIAGNOSIS — F419 Anxiety disorder, unspecified: Secondary | ICD-10-CM | POA: Diagnosis not present

## 2023-12-09 DIAGNOSIS — F330 Major depressive disorder, recurrent, mild: Secondary | ICD-10-CM | POA: Diagnosis not present

## 2023-12-10 DIAGNOSIS — I10 Essential (primary) hypertension: Secondary | ICD-10-CM | POA: Diagnosis not present

## 2023-12-10 DIAGNOSIS — E119 Type 2 diabetes mellitus without complications: Secondary | ICD-10-CM | POA: Diagnosis not present

## 2023-12-10 DIAGNOSIS — M47816 Spondylosis without myelopathy or radiculopathy, lumbar region: Secondary | ICD-10-CM | POA: Diagnosis not present

## 2023-12-10 DIAGNOSIS — M109 Gout, unspecified: Secondary | ICD-10-CM | POA: Diagnosis not present

## 2023-12-10 DIAGNOSIS — M25511 Pain in right shoulder: Secondary | ICD-10-CM | POA: Diagnosis not present

## 2023-12-10 DIAGNOSIS — G8929 Other chronic pain: Secondary | ICD-10-CM | POA: Diagnosis not present

## 2023-12-10 DIAGNOSIS — G5601 Carpal tunnel syndrome, right upper limb: Secondary | ICD-10-CM | POA: Diagnosis not present

## 2023-12-13 ENCOUNTER — Ambulatory Visit (HOSPITAL_COMMUNITY): Admission: RE | Admit: 2023-12-13 | Payer: Medicaid Other | Source: Ambulatory Visit

## 2023-12-16 DIAGNOSIS — Z794 Long term (current) use of insulin: Secondary | ICD-10-CM | POA: Diagnosis not present

## 2023-12-16 DIAGNOSIS — E1165 Type 2 diabetes mellitus with hyperglycemia: Secondary | ICD-10-CM | POA: Diagnosis not present

## 2023-12-17 ENCOUNTER — Other Ambulatory Visit (INDEPENDENT_AMBULATORY_CARE_PROVIDER_SITE_OTHER): Payer: Self-pay | Admitting: Primary Care

## 2023-12-17 ENCOUNTER — Ambulatory Visit (HOSPITAL_COMMUNITY)
Admission: RE | Admit: 2023-12-17 | Discharge: 2023-12-17 | Disposition: A | Discharge status: Home / Self Care | Payer: Medicaid Other | Source: Ambulatory Visit | Attending: Podiatry

## 2023-12-17 ENCOUNTER — Other Ambulatory Visit (HOSPITAL_BASED_OUTPATIENT_CLINIC_OR_DEPARTMENT_OTHER): Payer: Self-pay

## 2023-12-17 ENCOUNTER — Other Ambulatory Visit (HOSPITAL_COMMUNITY): Payer: Self-pay

## 2023-12-17 DIAGNOSIS — L97521 Non-pressure chronic ulcer of other part of left foot limited to breakdown of skin: Secondary | ICD-10-CM | POA: Insufficient documentation

## 2023-12-17 DIAGNOSIS — E11621 Type 2 diabetes mellitus with foot ulcer: Secondary | ICD-10-CM | POA: Insufficient documentation

## 2023-12-17 MED ORDER — BD PEN NEEDLE NANO 2ND GEN 32G X 4 MM MISC
1.0000 | Freq: Three times a day (TID) | 11 refills | Status: DC
  Filled 2023-12-17: qty 100, 30d supply, fill #0

## 2023-12-18 ENCOUNTER — Other Ambulatory Visit (HOSPITAL_COMMUNITY): Payer: Self-pay

## 2023-12-19 ENCOUNTER — Other Ambulatory Visit (HOSPITAL_COMMUNITY): Payer: Self-pay

## 2023-12-20 ENCOUNTER — Ambulatory Visit: Payer: Medicaid Other | Admitting: Podiatry

## 2023-12-20 ENCOUNTER — Other Ambulatory Visit: Payer: Self-pay

## 2023-12-20 ENCOUNTER — Encounter: Payer: Self-pay | Admitting: Podiatry

## 2023-12-20 DIAGNOSIS — E11621 Type 2 diabetes mellitus with foot ulcer: Secondary | ICD-10-CM

## 2023-12-20 DIAGNOSIS — L97521 Non-pressure chronic ulcer of other part of left foot limited to breakdown of skin: Secondary | ICD-10-CM | POA: Diagnosis not present

## 2023-12-20 NOTE — Progress Notes (Signed)
  Subjective:  Patient ID: Lindsay [name], female    DOB: March 04, 1981,   MRN: [id number]  Chief Complaint  Patient presents with   Wound Check    Pt presents for wound check diabetic ulcer of toe left foot states she is doing well.    42 y.o. female presents for concern of left fifth toe wound. Doing well and no changes.   . Denies any other pedal complaints. Denies n/v/f/c.   Past Medical History:  Diagnosis Date   ARF (acute renal failure) (HCC) 04/2015   Asthma    Cellulitis of right upper extremity    Coronary artery disease    Diabetes mellitus    insulin dependent   Hyperlipidemia LDL goal <70    Hypertension    Obesity    S/P angioplasty with stent 08/2016   DES to mLAD and PTCA only to 2nd diag ostium.     Objective:  Physical Exam: Vascular: DP/PT pulses 2/4 bilateral. CFT <3 seconds. Absent hair growth on digits. Edema noted to bilateral lower extremities. Xerosis noted bilaterally.  Skin. No lacerations or abrasions bilateral feet. Nails 1-5 bilateral  are thickened discolored and subungual debris. Lateral fifth digit hyperkeratosis with underlying ulceration healed.  Musculoskeletal: MMT 5/5 bilateral lower extremities in DF, PF, Inversion and Eversion. Deceased ROM in DF of ankle joint.  Neurological: Sensation intact to light touch. Protective sensation diminished bilateral.      AssessBilateral ABIs and TBIs appear essentially unchanged since exam of  02/02/2020.   Summary: Right: Resting right ankle-brachial index indicates noncompressible right  lower extremity arteries. The right toe-brachial index is normal.   Left: Resting left ankle-brachial index indicates noncompressible left  lower extremity arteries. The left toe-brachial index is normal.ment:   No diagnosis found.    Plan:  Patient was evaluated and treated and all questions answered. Ulcer left fifth digit- healed  -No debdridment necessary today  -Dressed toe cap -ABIs unchanged since  previous.  -Off-loading with surgical shoe. -Disucssed trying to get DM shoes but has had difficulty in the past.  -Discussed glucose control and proper protein-rich diet.  -Discussed if any worsening redness, pain, fever or chills to call or may need to report to the emergency room. Patient expressed understanding.  Follow-up as scheduled for rfc.   No follow-ups on file.   Louann Sjogren, DPM

## 2023-12-21 ENCOUNTER — Other Ambulatory Visit: Payer: Self-pay

## 2023-12-21 LAB — VAS US ABI WITH/WO TBI

## 2023-12-23 ENCOUNTER — Telehealth: Payer: Self-pay | Admitting: Primary Care

## 2023-12-23 NOTE — Telephone Encounter (Signed)
 Copied from CRM [id number]. Topic: General - Other >> Dec 21, 2023  1:24 PM Ja-Kwan M wrote: Reason for CRM: Pt reports that she was told by her pharmacy that a new Rx for insulin  pen needles is needed because they no longer carry the brand listed on the old Rx.

## 2023-12-24 DIAGNOSIS — F331 Major depressive disorder, recurrent, moderate: Secondary | ICD-10-CM | POA: Diagnosis not present

## 2023-12-24 DIAGNOSIS — F419 Anxiety disorder, unspecified: Secondary | ICD-10-CM | POA: Diagnosis not present

## 2023-12-24 MED ORDER — PEN NEEDLES 32G X 4 MM MISC: 6 refills | Status: DC

## 2023-12-24 NOTE — Telephone Encounter (Signed)
 Rx was sent on 12/17/23

## 2023-12-24 NOTE — Addendum Note (Signed)
 Addended by: Lois Huxley, Jeannett Senior L on: 12/24/2023 05:05 PM   Modules accepted: Orders

## 2023-12-24 NOTE — Telephone Encounter (Signed)
 I sent a rx on 12/17/23 would you be able to change rx that insurance will cover

## 2023-12-27 ENCOUNTER — Other Ambulatory Visit (INDEPENDENT_AMBULATORY_CARE_PROVIDER_SITE_OTHER): Payer: Self-pay | Admitting: Primary Care

## 2023-12-27 ENCOUNTER — Telehealth: Payer: Self-pay

## 2023-12-27 ENCOUNTER — Other Ambulatory Visit: Payer: Self-pay

## 2023-12-27 DIAGNOSIS — R200 Anesthesia of skin: Secondary | ICD-10-CM

## 2023-12-27 DIAGNOSIS — Z760 Encounter for issue of repeat prescription: Secondary | ICD-10-CM

## 2023-12-27 DIAGNOSIS — G5601 Carpal tunnel syndrome, right upper limb: Secondary | ICD-10-CM | POA: Diagnosis not present

## 2023-12-27 DIAGNOSIS — E118 Type 2 diabetes mellitus with unspecified complications: Secondary | ICD-10-CM

## 2023-12-27 NOTE — Telephone Encounter (Signed)
 Will forward to provider

## 2023-12-27 NOTE — Telephone Encounter (Signed)
 Called patient to schedule a VV for clearance. Left message on VM for patient to contact our   office to schedule tele pre-op appointment.

## 2023-12-27 NOTE — Telephone Encounter (Signed)
   Pre-operative Risk Assessment    Patient Name: Lindsay [name]  DOB: Sep 03, 1981 MRN: [id number]   Date of last office visit: 03/09/23 Date of next office visit: none    Request for Surgical Clearance    Procedure:  Rt carpal tunnel release   Date of Surgery:  Clearance TBD                                 Surgeon:  Dr. Evalene Chancy  Surgeon's Group or Practice Name:  [facility]  Phone number:  [phone] Fax number:  [phone]   Type of Clearance Requested:   - Medical    Type of Anesthesia:  Choice    Additional requests/questions:    SignedConnye GORMAN Hoit   12/27/2023, 11:48 AM

## 2023-12-27 NOTE — Telephone Encounter (Signed)
 Primary Cardiologist:Mahesh A Santo, MD   Preoperative team, please contact this patient and set up a phone call appointment for further preoperative risk assessment. Please obtain consent and complete medication review. Thank you for your help.   I confirm that guidance regarding antiplatelet and oral anticoagulation therapy has been completed and, if necessary, noted below (none requested).   I also confirmed the patient resides in the state of [place] . As per [facility] Medical Board telemedicine laws, the patient must reside in the state in which the provider is licensed.   Rosaline EMERSON Bane, NP-C  12/27/2023, 12:15 PM [address], [address] Office [phone] Fax [phone]

## 2023-12-29 ENCOUNTER — Telehealth: Payer: Self-pay

## 2023-12-29 NOTE — Telephone Encounter (Signed)
  Patient Consent for Virtual Visit        Lindsay [name] has provided verbal consent on 12/29/2023 for a virtual visit (video or telephone).   CONSENT FOR VIRTUAL VISIT FOR:  Lindsay [name]  By participating in this virtual visit I agree to the following:  I hereby voluntarily request, consent and authorize [facility] and its employed or contracted physicians, physician assistants, nurse practitioners or other licensed health care professionals (the Practitioner), to provide me with telemedicine health care services (the "Services) as deemed necessary by the treating Practitioner. I acknowledge and consent to receive the Services by the Practitioner via telemedicine. I understand that the telemedicine visit will involve communicating with the Practitioner through live audiovisual communication technology and the disclosure of certain medical information by electronic transmission. I acknowledge that I have been given the opportunity to request an in-person assessment or other available alternative prior to the telemedicine visit and am voluntarily participating in the telemedicine visit.  I understand that I have the right to withhold or withdraw my consent to the use of telemedicine in the course of my care at any time, without affecting my right to future care or treatment, and that the Practitioner or I may terminate the telemedicine visit at any time. I understand that I have the right to inspect all information obtained and/or recorded in the course of the telemedicine visit and may receive copies of available information for a reasonable fee.  I understand that some of the potential risks of receiving the Services via telemedicine include:  Delay or interruption in medical evaluation due to technological equipment failure or disruption; Information transmitted may not be sufficient (e.g. poor resolution of images) to allow for appropriate medical decision making by the  Practitioner; and/or  In rare instances, security protocols could fail, causing a breach of personal health information.  Furthermore, I acknowledge that it is my responsibility to provide information about my medical history, conditions and care that is complete and accurate to the best of my ability. I acknowledge that Practitioner's advice, recommendations, and/or decision may be based on factors not within their control, such as incomplete or inaccurate data provided by me or distortions of diagnostic images or specimens that may result from electronic transmissions. I understand that the practice of medicine is not an exact science and that Practitioner makes no warranties or guarantees regarding treatment outcomes. I acknowledge that a copy of this consent can be made available to me via my patient portal [facility] MyChart), or I can request a printed copy by calling the office of [facility].    I understand that my insurance will be billed for this visit.   I have read or had this consent read to me. I understand the contents of this consent, which adequately explains the benefits and risks of the Services being provided via telemedicine.  I have been provided ample opportunity to ask questions regarding this consent and the Services and have had my questions answered to my satisfaction. I give my informed consent for the services to be provided through the use of telemedicine in my medical care

## 2023-12-29 NOTE — Telephone Encounter (Signed)
 Preop televisit scheduled, med rec and consent done

## 2024-01-03 DIAGNOSIS — F331 Major depressive disorder, recurrent, moderate: Secondary | ICD-10-CM | POA: Diagnosis not present

## 2024-01-03 DIAGNOSIS — F419 Anxiety disorder, unspecified: Secondary | ICD-10-CM | POA: Diagnosis not present

## 2024-01-04 ENCOUNTER — Other Ambulatory Visit (INDEPENDENT_AMBULATORY_CARE_PROVIDER_SITE_OTHER): Payer: Self-pay | Admitting: Primary Care

## 2024-01-04 DIAGNOSIS — E118 Type 2 diabetes mellitus with unspecified complications: Secondary | ICD-10-CM

## 2024-01-05 NOTE — Telephone Encounter (Signed)
 Please refill if appropriate

## 2024-01-10 DIAGNOSIS — Z6841 Body Mass Index (BMI) 40.0 and over, adult: Secondary | ICD-10-CM | POA: Diagnosis not present

## 2024-01-10 DIAGNOSIS — D508 Other iron deficiency anemias: Secondary | ICD-10-CM | POA: Diagnosis not present

## 2024-01-10 DIAGNOSIS — E119 Type 2 diabetes mellitus without complications: Secondary | ICD-10-CM | POA: Diagnosis not present

## 2024-01-10 DIAGNOSIS — M109 Gout, unspecified: Secondary | ICD-10-CM | POA: Diagnosis not present

## 2024-01-10 DIAGNOSIS — M25511 Pain in right shoulder: Secondary | ICD-10-CM | POA: Diagnosis not present

## 2024-01-10 DIAGNOSIS — I10 Essential (primary) hypertension: Secondary | ICD-10-CM | POA: Diagnosis not present

## 2024-01-10 DIAGNOSIS — M47816 Spondylosis without myelopathy or radiculopathy, lumbar region: Secondary | ICD-10-CM | POA: Diagnosis not present

## 2024-01-10 DIAGNOSIS — G5601 Carpal tunnel syndrome, right upper limb: Secondary | ICD-10-CM | POA: Diagnosis not present

## 2024-01-11 ENCOUNTER — Ambulatory Visit: Payer: Medicaid Other | Attending: Cardiology | Admitting: Nurse Practitioner

## 2024-01-11 ENCOUNTER — Telehealth: Payer: Self-pay | Admitting: Internal Medicine

## 2024-01-11 DIAGNOSIS — Z01810 Encounter for preprocedural cardiovascular examination: Secondary | ICD-10-CM | POA: Diagnosis not present

## 2024-01-11 NOTE — Telephone Encounter (Signed)
See previous pre-op encounter. Patient states she received call for telehealth, but when she tried to answer the line disconnected. She would like to know if she can still speak with someone soon. Please advise.

## 2024-01-11 NOTE — Progress Notes (Signed)
Virtual Visit via Telephone Note   Because of Lindsay [name]'s co-morbid illnesses, she is at least at moderate risk for complications without adequate follow up.  This format is felt to be most appropriate for this patient at this time.  The patient did not have access to video technology/had technical difficulties with video requiring transitioning to audio format only (telephone).  All issues noted in this document were discussed and addressed.  No physical exam could be performed with this format.  Please refer to the patient's chart for her consent to telehealth for [facility].  Evaluation Performed:  Preoperative cardiovascular risk assessment _____________   Date:  01/11/2024   Patient ID:  Lindsay [name], DOB 03-06-1981, MRN [id number] Patient Location:  Home Provider location:   Office  Primary Care Provider:  Grayce Sessions, NP Primary Cardiologist:  Christell Constant, MD  Chief Complaint / Patient Profile   43 y.o. y/o female with a h/o CAD s/p DES-LAD, POBA-D2 in 2017, ICM, palpitations, hypertension, hyperlipidemia, type 2 diabetes and obesity  who is pending Rt carpal tunnel release with Dr. Margarita Rana of [facility] and presents today for telephonic preoperative cardiovascular risk assessment.  History of Present Illness    Lindsay [name] is a 43 y.o. female who presents via audio/video conferencing for a telehealth visit today.  Pt was last seen in cardiology clinic on 03/09/2023 by Dr. Izora Ribas.  At that time Lindsay [name] was doing well. The patient is now pending procedure as outlined above. Since her last visit, she has done well from a cardiac standpoint.   She denies chest pain, palpitations, dyspnea, pnd, orthopnea, n, v, dizziness, syncope, edema, weight gain, or early satiety. All other systems reviewed and are otherwise negative except as noted above.   Past Medical History    Past Medical History:   Diagnosis Date   ARF (acute renal failure) (HCC) 04/2015   Asthma    Cellulitis of right upper extremity    Coronary artery disease    Diabetes mellitus    insulin dependent   Hyperlipidemia LDL goal <70    Hypertension    Obesity    S/P angioplasty with stent 08/2016   DES to mLAD and PTCA only to 2nd diag ostium.    Past Surgical History:  Procedure Laterality Date   CARDIAC CATHETERIZATION N/A 09/07/2016   Procedure: Left Heart Cath and Coronary Angiography;  Surgeon: Lyn Records, MD;  Location: [facility] INVASIVE CV LAB;  Service: Cardiovascular;  Laterality: N/A;   CARDIAC CATHETERIZATION N/A 09/07/2016   Procedure: Coronary Stent Intervention;  Surgeon: Lyn Records, MD;  Location: [facility] INVASIVE CV LAB;  Service: Cardiovascular;  Laterality: N/A;   CARDIAC CATHETERIZATION N/A 09/07/2016   Procedure: Coronary Balloon Angioplasty;  Surgeon: Lyn Records, MD;  Location: [facility] INVASIVE CV LAB;  Service: Cardiovascular;  Laterality: N/A;   CESAREAN SECTION     IRRIGATION AND DEBRIDEMENT SHOULDER Right 04/30/2015   Procedure: IRRIGATION AND DEBRIDEMENT SHOULDER;  Surgeon: Sheral Apley, MD;  Location: [facility] OR;  Service: Orthopedics;  Laterality: Right;   IRRIGATION AND DEBRIDEMENT SHOULDER Right 05/01/2015   LEFT HEART CATH AND CORONARY ANGIOGRAPHY N/A 04/25/2020   Procedure: LEFT HEART CATH AND CORONARY ANGIOGRAPHY;  Surgeon: Lyn Records, MD;  Location: [facility] INVASIVE CV LAB;  Service: Cardiovascular;  Laterality: N/A;   LEG SURGERY     SHOULDER ARTHROSCOPY Right 04/30/2015   Procedure: ARTHROSCOPY SHOULDER;  Surgeon: Sheral Apley,  MD;  Location: [facility] OR;  Service: Orthopedics;  Laterality: Right;   TONSILLECTOMY      Allergies  Allergies  Allergen Reactions   Acetaminophen Itching and Swelling    Itching of the mouth, swelling of tongue and stomach started hurting mild   Hydrazine Yellow [Fd&C Yellow #5 (Tartrazine)] Shortness Of Breath and Swelling    Swelling mostly noticed in legs  and feet, retaining urination, shortness of breaht, and minor chest pain   Lisinopril Shortness Of Breath    Was on prinzide; had sob/chest pain on it.   Pineapple Itching    Home Medications    Prior to Admission medications   Medication Sig Start Date End Date Taking? Authorizing Provider  Accu-Chek FastClix Lancets MISC USE 1 TO CHECK GLUCOSE 4 TIMES DAILY 01/26/20   Hoy Register, MD  acetaminophen (TYLENOL) 325 MG tablet Take 2 tablets (650 mg total) by mouth every 6 (six) hours as needed for mild pain, fever or headache. 08/23/22   Drema Dallas, MD  albuterol (VENTOLIN HFA) 108 (90 Base) MCG/ACT inhaler Inhale 2 puffs into the lungs every 6 (six) hours as needed for wheezing or shortness of breath. 08/28/21   Grayce Sessions, NP  allopurinol (ZYLOPRIM) 100 MG tablet Take 100 mg by mouth daily.    [provider]  aspirin 81 MG tablet Take 1 tablet (81 mg total) by mouth daily. 01/13/18   Loletta Specter, PA-C  atorvastatin (LIPITOR) 20 MG tablet TAKE 1 TABLET(20 MG) BY MOUTH DAILY 10/16/22   Grayce Sessions, NP  BD VEO INSULIN SYRINGE U/F 31G X 15/64" 1 ML MISC USE  SYRINGE ONCE DAILY 06/07/18   Reather Littler, MD  Blood Glucose Monitoring Suppl (ACCU-CHEK AVIVA PLUS) w/Device KIT 1 each by Does not apply route as directed. 04/15/18   Reather Littler, MD  Continuous Blood Gluc Transmit (DEXCOM G6 TRANSMITTER) MISC Inject into the skin. 06/09/21   [provider]  dextromethorphan-guaiFENesin (TUSSIN DM) 10-100 MG/5ML liquid Take 10 mLs by mouth every 4 (four) hours as needed for cough. 09/28/23   Jacalyn Lefevre, MD  diclofenac Sodium (VOLTAREN) 1 % GEL Apply 4 g topically 4 (four) times daily. Patient not taking: Reported on 09/13/2023 12/03/21   Grayce Sessions, NP  DULoxetine (CYMBALTA) 30 MG capsule TAKE 1 CAPSULE(30 MG) BY MOUTH DAILY 12/28/23   Grayce Sessions, NP  fluticasone (FLOVENT HFA) 110 MCG/ACT inhaler Inhale 2 puffs into the lungs 2 (two) times daily.  08/28/21   Grayce Sessions, NP  gabapentin (NEURONTIN) 300 MG capsule TAKE 2 CAPSULES(600 MG) BY MOUTH THREE TIMES DAILY 01/05/24   Grayce Sessions, NP  gentamicin cream (GARAMYCIN) 0.1 % Apply to left 5th toe once daily. 11/09/23   Freddie Breech, DPM  glucose blood (ACCU-CHEK AVIVA PLUS) test strip Use as instructed to test blood sugar 4 times daily. 01/11/19   [name], NP  HUMULIN R U-500 KWIKPEN 500 UNIT/ML KwikPen Inject 75-80 Units into the skin 3 (three) times daily with meals. AM 80 units Noon 75 units Evening 75 units If BG above 200 add 5 units to prescribed dose 10/23/22   Grayce Sessions, NP  hydrALAZINE (APRESOLINE) 100 MG tablet TAKE 1 TABLET(100 MG) BY MOUTH THREE TIMES DAILY 01/20/23   Grayce Sessions, NP  Insulin Pen Needle (PEN NEEDLES) 32G X 4 MM MISC Use to inject insulin 3 times daily. 12/24/23   Hoy Register, MD  isosorbide mononitrate (IMDUR) 60 MG 24  hr tablet Take 1 tablet (60 mg total) by mouth daily. 03/09/23   Christell Constant, MD  losartan (COZAAR) 100 MG tablet Take 1 tablet (100 mg total) by mouth daily. 03/09/23   Chandrasekhar, Rondel Jumbo, MD  Melatonin 10 MG TABS Take 3 tablets by mouth at bedtime as needed (sleep). 09/25/22   Grayce Sessions, NP  methocarbamol (ROBAXIN) 750 MG tablet Take 1 tablet (750 mg total) by mouth every 8 (eight) hours as needed. 09/13/23   Grayce Sessions, NP  MOUNJARO 2.5 MG/0.5ML Pen Inject 2.5 mg into the skin once a week.    [provider]  Multiple Vitamins-Minerals (MULTIVITAMIN PO) Take 1 tablet by mouth daily.    [provider]  naloxone [facility]) nasal spray 4 mg/0.1 mL Place into both nostrils as needed (over dose). 10/06/22   [provider]  nitroGLYCERIN (NITROSTAT) 0.4 MG SL tablet Place 1 tablet (0.4 mg total) under the tongue every 5 (five) minutes as needed for chest pain. Please keep upcoming appt in October 2022. Final Attempt 06/19/21   Lyn Records, MD   ondansetron (ZOFRAN-ODT) 4 MG disintegrating tablet Take 1 tablet (4 mg total) by mouth every 8 (eight) hours as needed. 09/28/23   Jacalyn Lefevre, MD  oxyCODONE-acetaminophen (PERCOCET) 10-325 MG tablet Take 1 tablet by mouth 3 (three) times daily as needed. 01/16/23   [provider]  potassium chloride SA (KLOR-CON) 20 MEQ tablet Take 1 tablet (20 mEq total) by mouth daily. 08/15/21   Lyn Records, MD  Semaglutide, 1 MG/DOSE, (OZEMPIC, 1 MG/DOSE,) 2 MG/1.5ML SOPN Inject 1 mg into the skin once a week. Patient not taking: Reported on 12/29/2023    [provider]  sodium chloride (OCEAN) 0.65 % nasal spray Place 1 spray into the nose as needed for congestion. 02/19/17   [provider]  torsemide (DEMADEX) 20 MG tablet Take 2 tablets (40 mg total) by mouth 2 (two) times daily. 05/20/23   Chandrasekhar, Rondel Jumbo, MD  Vitamin D, Ergocalciferol, (DRISDOL) 1.25 MG (50000 UNIT) CAPS capsule Take 50,000 Units by mouth once a week. Patient not taking: Reported on 12/29/2023    [provider]    Physical Exam    Vital Signs:  Vesta D Ysaguirre does not have vital signs available for review today.  Given telephonic nature of communication, physical exam is limited. AAOx3. NAD. Normal affect.  Speech and respirations are unlabored.  Accessory Clinical Findings    None  Assessment & Plan    1.  Preoperative Cardiovascular Risk Assessment:  According to the Revised Cardiac Risk Index (RCRI), her Perioperative Risk of Major Cardiac Event is (%): 6.6. Her Functional Capacity in METs is: 9.89 according to the [facility] Activity Status Index (DASI). Therefore, based on ACC/AHA guidelines, patient would be at acceptable risk for the planned procedure without further cardiovascular testing.   The patient was advised that if she develops new symptoms prior to surgery to contact our office to arrange for a follow-up visit, and she verbalized understanding.  Regarding ASA  therapy, we recommend continuation of ASA throughout the perioperative period.  However, if the surgeon feels that cessation of ASA is required in the perioperative period, it may be stopped 5-7 days prior to surgery with a plan to resume it as soon as felt to be feasible from a surgical standpoint in the post-operative period.  A copy of this note will be routed to requesting surgeon.  Time:   Today, I have  spent 5 minutes with the patient with telehealth technology discussing medical history, symptoms, and management plan.     Joylene Grapes, NP  01/11/2024, 11:48 AM

## 2024-01-11 NOTE — Telephone Encounter (Signed)
I will forward back to preop APP as they attempted to call the pt

## 2024-01-25 DIAGNOSIS — K529 Noninfective gastroenteritis and colitis, unspecified: Secondary | ICD-10-CM | POA: Diagnosis not present

## 2024-01-25 DIAGNOSIS — R110 Nausea: Secondary | ICD-10-CM | POA: Diagnosis not present

## 2024-02-11 DIAGNOSIS — E119 Type 2 diabetes mellitus without complications: Secondary | ICD-10-CM | POA: Diagnosis not present

## 2024-02-11 DIAGNOSIS — M25511 Pain in right shoulder: Secondary | ICD-10-CM | POA: Diagnosis not present

## 2024-02-11 DIAGNOSIS — Z79899 Other long term (current) drug therapy: Secondary | ICD-10-CM | POA: Diagnosis not present

## 2024-02-11 DIAGNOSIS — M546 Pain in thoracic spine: Secondary | ICD-10-CM | POA: Diagnosis not present

## 2024-02-11 DIAGNOSIS — Z3200 Encounter for pregnancy test, result unknown: Secondary | ICD-10-CM | POA: Diagnosis not present

## 2024-02-11 DIAGNOSIS — Z6841 Body Mass Index (BMI) 40.0 and over, adult: Secondary | ICD-10-CM | POA: Diagnosis not present

## 2024-02-11 DIAGNOSIS — G5601 Carpal tunnel syndrome, right upper limb: Secondary | ICD-10-CM | POA: Diagnosis not present

## 2024-02-11 DIAGNOSIS — M47816 Spondylosis without myelopathy or radiculopathy, lumbar region: Secondary | ICD-10-CM | POA: Diagnosis not present

## 2024-02-12 ENCOUNTER — Other Ambulatory Visit (INDEPENDENT_AMBULATORY_CARE_PROVIDER_SITE_OTHER): Payer: Self-pay | Admitting: Primary Care

## 2024-02-12 DIAGNOSIS — M62838 Other muscle spasm: Secondary | ICD-10-CM

## 2024-02-14 NOTE — Telephone Encounter (Signed)
 Requested medication (s) are due for refill today - yes  Requested medication (s) are on the active medication list -yes  Future visit scheduled -yes  Last refill: 09/13/23 #90 1RF  Notes to clinic: non delegated Rx  Requested Prescriptions  Pending Prescriptions Disp Refills   methocarbamol (ROBAXIN) 750 MG tablet [Pharmacy Med Name: METHOCARBAMOL 750MG  TABLETS] 90 tablet 1    Sig: TAKE 1 TABLET(750 MG) BY MOUTH EVERY 8 HOURS AS NEEDED     Not Delegated - Analgesics:  Muscle Relaxants Failed - 02/14/2024  3:22 PM      Failed - This refill cannot be delegated      Passed - Valid encounter within last 6 months    Recent Outpatient Visits           5 months ago Muscle spasm   [place] [facility] Grayce Sessions, NP   11 months ago Diabetes mellitus with complication [facility])   [place] [facility] Grayce Sessions, NP   1 year ago Diabetes mellitus with complication [facility])   [place] [facility] Grayce Sessions, NP   1 year ago Annual physical exam   [place] [facility] Grayce Sessions, NP   2 years ago Uncomplicated asthma, unspecified asthma severity, unspecified whether persistent   [place] [facility] Grayce Sessions, NP       Future Appointments             In 4 weeks [name], Kinnie Scales, NP [place] [facility]               Requested Prescriptions  Pending Prescriptions Disp Refills   methocarbamol (ROBAXIN) 750 MG tablet [Pharmacy Med Name: METHOCARBAMOL 750MG  TABLETS] 90 tablet 1    Sig: TAKE 1 TABLET(750 MG) BY MOUTH EVERY 8 HOURS AS NEEDED     Not Delegated - Analgesics:  Muscle Relaxants Failed - 02/14/2024  3:22 PM      Failed - This refill cannot be delegated      Passed - Valid encounter within last 6 months    Recent Outpatient Visits           5 months ago Muscle spasm   [place] [facility]  Grayce Sessions, NP   11 months ago Diabetes mellitus with complication [facility])   [place] [facility] Grayce Sessions, NP   1 year ago Diabetes mellitus with complication [facility])   [place] [facility] Grayce Sessions, NP   1 year ago Annual physical exam   [place] [facility] Grayce Sessions, NP   2 years ago Uncomplicated asthma, unspecified asthma severity, unspecified whether persistent   [place] [facility] Grayce Sessions, NP       Future Appointments             In 4 weeks [name], Kinnie Scales, NP  [facility]

## 2024-02-14 NOTE — Telephone Encounter (Signed)
 Will forward to provider

## 2024-02-15 DIAGNOSIS — Z79899 Other long term (current) drug therapy: Secondary | ICD-10-CM | POA: Diagnosis not present

## 2024-02-24 IMAGING — MG MM DIGITAL DIAGNOSTIC UNILAT*L* W/ TOMO W/ CAD
8 series · 8 of 24 positions shown · non-contrast
Comparison: Previous exam(s).

CLINICAL DATA: The patient was called back for a left breast
asymmetry

EXAM:
DIGITAL DIAGNOSTIC UNILATERAL LEFT MAMMOGRAM WITH TOMOSYNTHESIS AND
CAD
TECHNIQUE: Left digital diagnostic mammography and breast tomosynthesis was
performed. The images were evaluated with computer-aided detection.

[L CC synth-2D (1 of 2)]
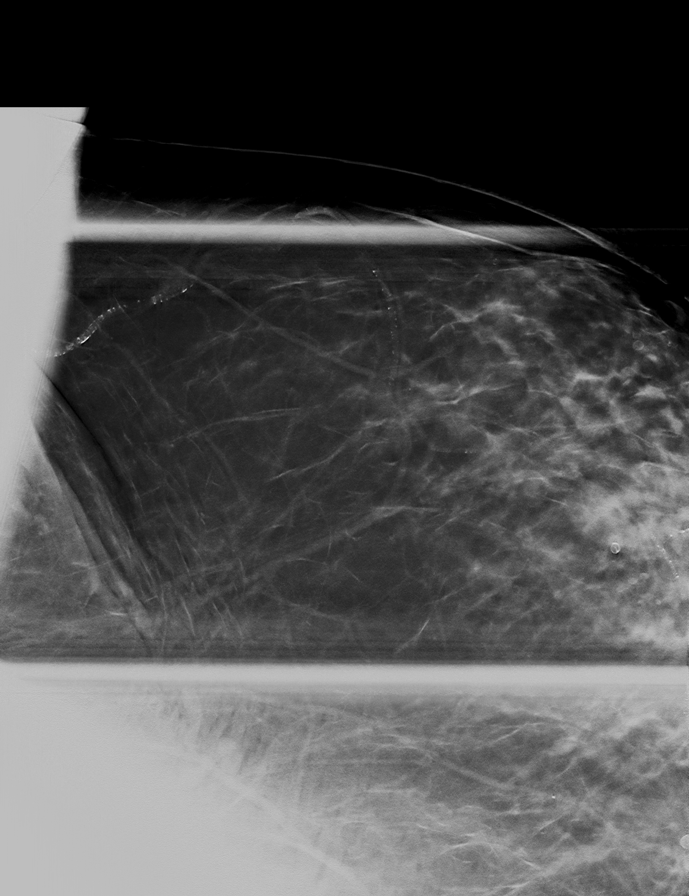

[L CC synth-2D (2 of 2)]
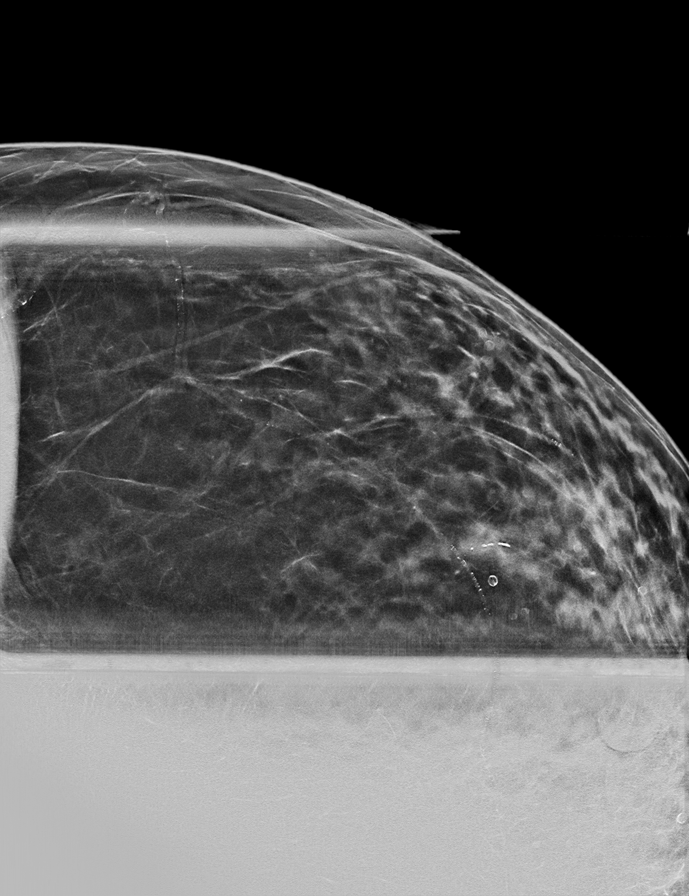

[L ML synth-2D (1 of 2)]
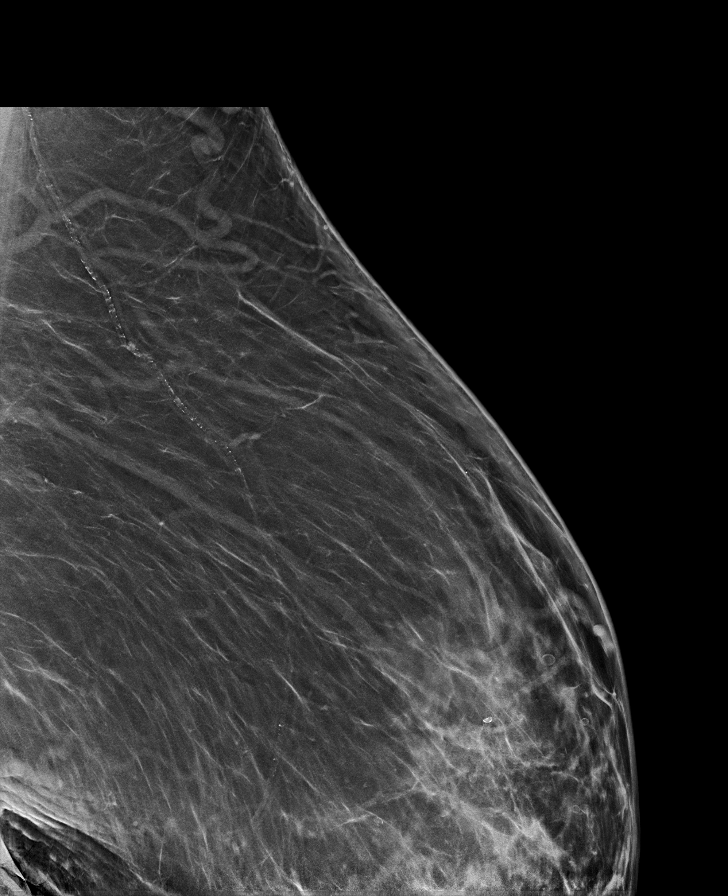

[L ML synth-2D (2 of 2)]
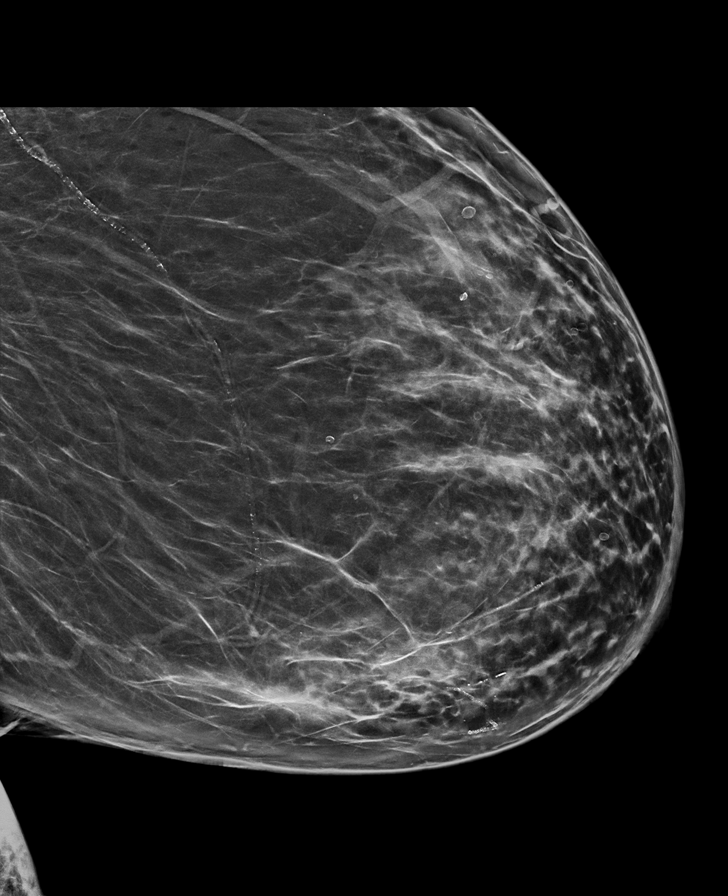

[L ML tomo (1 of 2) · tomo slice 53/104.0]
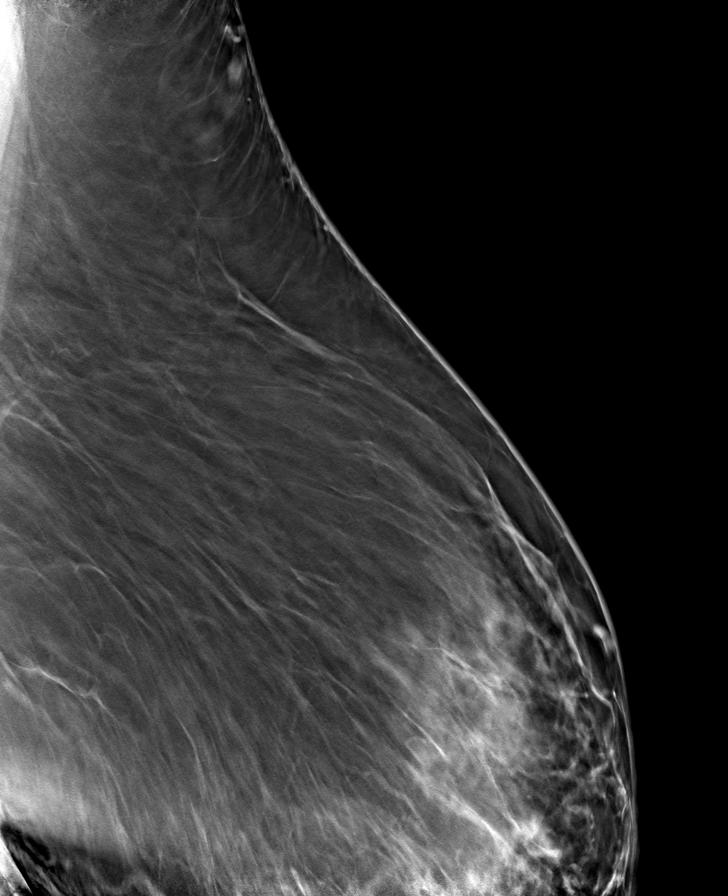

[L ML tomo (2 of 2) · tomo slice 43/86.0]
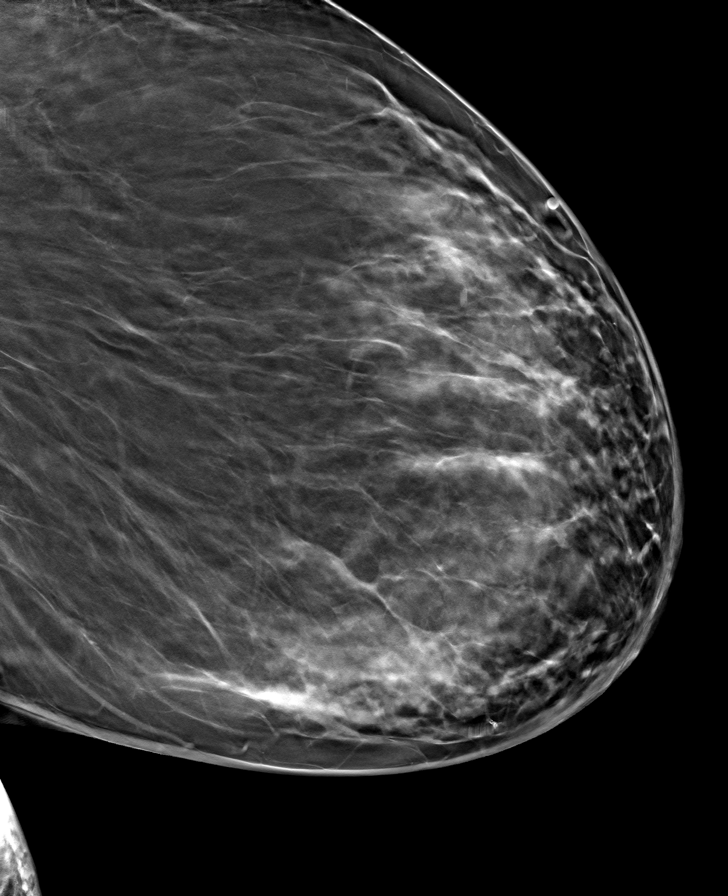

[L CC tomo (1 of 2) · tomo slice 34/67.0]
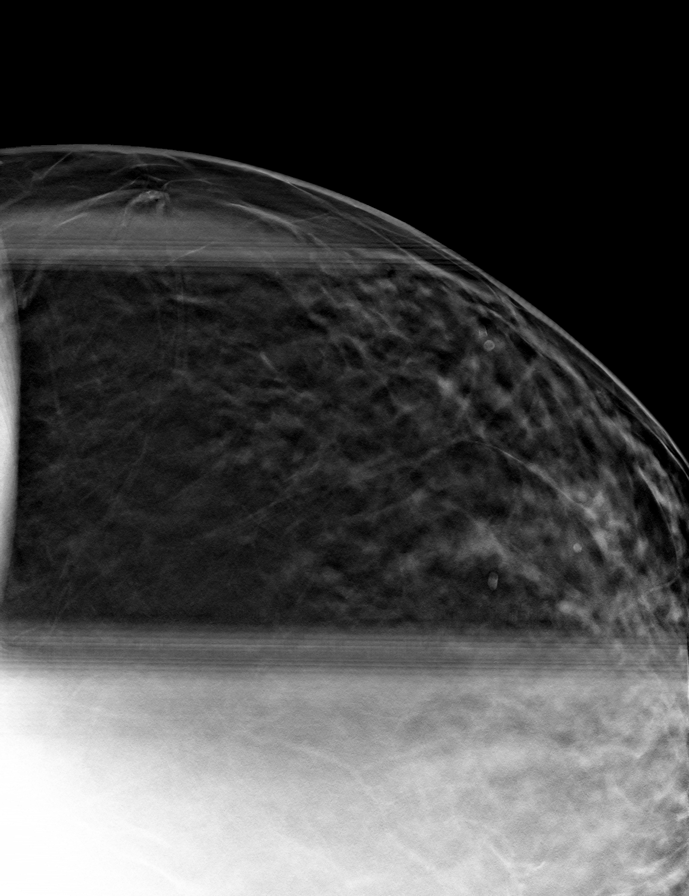

[L CC tomo (2 of 2) · tomo slice 43/84.0]
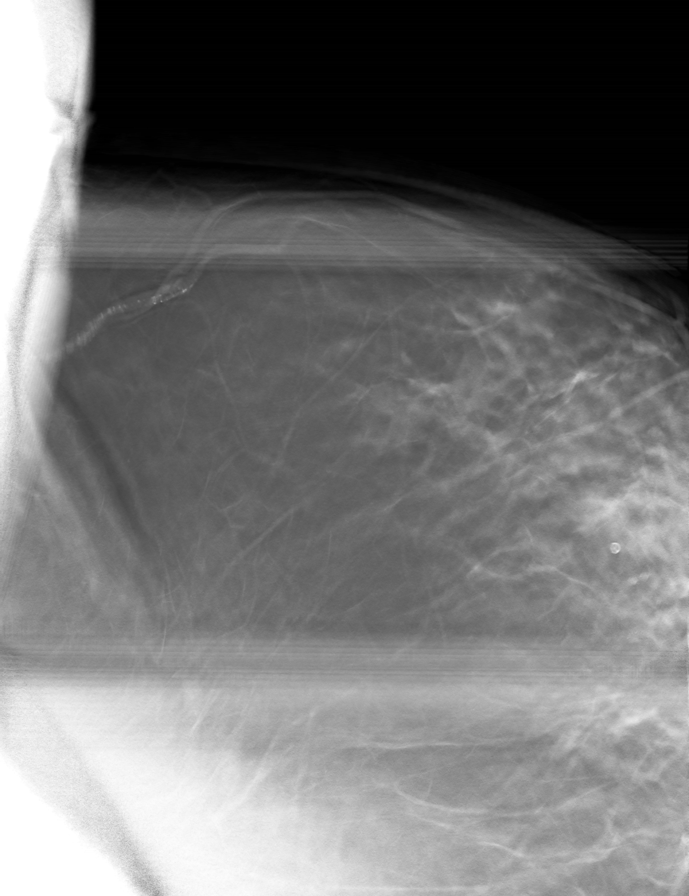

[8 of 24 positions shown; findings below may reference images not displayed]

ACR Breast Density Category b: There are scattered areas of
fibroglandular density.
FINDINGS: The left breast asymmetry resolves on additional imaging.
IMPRESSION: No mammographic evidence of malignancy. Resolution of left breast
asymmetry identified at screening mammography.

RECOMMENDATION:
Annual screening mammography.

I have discussed the findings and recommendations with the patient.
If applicable, a reminder letter will be sent to the patient
regarding the next appointment.

BI-RADS CATEGORY  1: Negative.

## 2024-03-08 ENCOUNTER — Ambulatory Visit: Payer: Medicaid Other | Admitting: Podiatry

## 2024-03-08 ENCOUNTER — Encounter: Payer: Self-pay | Admitting: Podiatry

## 2024-03-08 DIAGNOSIS — M79675 Pain in left toe(s): Secondary | ICD-10-CM

## 2024-03-08 DIAGNOSIS — L601 Onycholysis: Secondary | ICD-10-CM | POA: Diagnosis not present

## 2024-03-08 DIAGNOSIS — Z8631 Personal history of diabetic foot ulcer: Secondary | ICD-10-CM

## 2024-03-08 DIAGNOSIS — B351 Tinea unguium: Secondary | ICD-10-CM | POA: Diagnosis not present

## 2024-03-08 DIAGNOSIS — M2042 Other hammer toe(s) (acquired), left foot: Secondary | ICD-10-CM

## 2024-03-08 DIAGNOSIS — E119 Type 2 diabetes mellitus without complications: Secondary | ICD-10-CM

## 2024-03-08 DIAGNOSIS — E1142 Type 2 diabetes mellitus with diabetic polyneuropathy: Secondary | ICD-10-CM | POA: Diagnosis not present

## 2024-03-08 DIAGNOSIS — M79674 Pain in right toe(s): Secondary | ICD-10-CM

## 2024-03-08 DIAGNOSIS — M2041 Other hammer toe(s) (acquired), right foot: Secondary | ICD-10-CM | POA: Diagnosis not present

## 2024-03-09 ENCOUNTER — Encounter: Payer: Self-pay | Admitting: Podiatry

## 2024-03-10 DIAGNOSIS — Z8739 Personal history of other diseases of the musculoskeletal system and connective tissue: Secondary | ICD-10-CM | POA: Diagnosis not present

## 2024-03-10 DIAGNOSIS — E119 Type 2 diabetes mellitus without complications: Secondary | ICD-10-CM | POA: Diagnosis not present

## 2024-03-10 DIAGNOSIS — M47816 Spondylosis without myelopathy or radiculopathy, lumbar region: Secondary | ICD-10-CM | POA: Diagnosis not present

## 2024-03-10 DIAGNOSIS — Z3200 Encounter for pregnancy test, result unknown: Secondary | ICD-10-CM | POA: Diagnosis not present

## 2024-03-10 DIAGNOSIS — M546 Pain in thoracic spine: Secondary | ICD-10-CM | POA: Diagnosis not present

## 2024-03-10 DIAGNOSIS — M25511 Pain in right shoulder: Secondary | ICD-10-CM | POA: Diagnosis not present

## 2024-03-10 DIAGNOSIS — Z6841 Body Mass Index (BMI) 40.0 and over, adult: Secondary | ICD-10-CM | POA: Diagnosis not present

## 2024-03-10 DIAGNOSIS — E611 Iron deficiency: Secondary | ICD-10-CM | POA: Diagnosis not present

## 2024-03-10 DIAGNOSIS — G5601 Carpal tunnel syndrome, right upper limb: Secondary | ICD-10-CM | POA: Diagnosis not present

## 2024-03-13 ENCOUNTER — Other Ambulatory Visit (INDEPENDENT_AMBULATORY_CARE_PROVIDER_SITE_OTHER): Payer: Self-pay | Admitting: Primary Care

## 2024-03-13 ENCOUNTER — Ambulatory Visit (INDEPENDENT_AMBULATORY_CARE_PROVIDER_SITE_OTHER): Payer: Medicaid Other | Admitting: Primary Care

## 2024-03-13 DIAGNOSIS — M62838 Other muscle spasm: Secondary | ICD-10-CM

## 2024-03-13 DIAGNOSIS — F331 Major depressive disorder, recurrent, moderate: Secondary | ICD-10-CM | POA: Diagnosis not present

## 2024-03-13 DIAGNOSIS — F419 Anxiety disorder, unspecified: Secondary | ICD-10-CM | POA: Diagnosis not present

## 2024-03-13 NOTE — Progress Notes (Signed)
 ANNUAL DIABETIC FOOT EXAM  Subjective: Lindsay [name] presents today for annual diabetic foot exam.  Chief Complaint  Patient presents with   Diabetes    "Clip my toenails and general Diabetic foot care."  Saw Lindsay Needy, NP 12/16/2023.  A1c - 8.7   Patient confirms h/o diabetes.  Patient denies any h/o foot wounds.  Patient has h/o foot ulcer.  Patient has been diagnosed with neuropathy.  Lindsay Sessions, NP is patient's PCP.  Past Medical History:  Diagnosis Date   ARF (acute renal failure) (HCC) 04/2015   Asthma    Cellulitis of right upper extremity    Coronary artery disease    Diabetes mellitus    insulin dependent   Hyperlipidemia LDL goal <70    Hypertension    Obesity    S/P angioplasty with stent 08/2016   DES to mLAD and PTCA only to 2nd diag ostium.    Patient Active Problem List   Diagnosis Date Noted   Chronic heart failure with preserved ejection fraction (HCC) 03/09/2023   Back pain 08/15/2022   Mood disorder (HCC) 08/15/2022   Pseudophakia of left eye 12/04/2021   Status post cataract extraction and insertion of intraocular lens of right eye 12/04/2021   Elevated liver enzymes 05/23/2020   Spondylosis of lumbar spine 06/22/2019   Hyperlipidemia 06/22/2019   Normocytic anemia 06/22/2019   Vitamin D deficiency 06/22/2019   Uncontrolled type 2 diabetes mellitus with hyperglycemia, with long-term current use of insulin (HCC) 08/31/2017   OSA (obstructive sleep apnea) 11/03/2016   Coronary artery disease involving native coronary artery of native heart with angina pectoris (HCC) 09/11/2016   Diabetes mellitus with complication (HCC)    Chest pain of uncertain etiology 09/07/2016   MI, old    DM neuropathy, type II diabetes mellitus (HCC) 06/10/2016   Morbid obesity (HCC) 06/10/2016   Pressure ulcer 05/17/2015   Anaerobic abscess (HCC)    Septic arthritis (HCC)    Essential hypertension 04/30/2015   Cellulitis 04/30/2015   Abscess  of right shoulder    Past Surgical History:  Procedure Laterality Date   CARDIAC CATHETERIZATION N/A 09/07/2016   Procedure: Left Heart Cath and Coronary Angiography;  Surgeon: Lyn Records, MD;  Location: [facility] INVASIVE CV LAB;  Service: Cardiovascular;  Laterality: N/A;   CARDIAC CATHETERIZATION N/A 09/07/2016   Procedure: Coronary Stent Intervention;  Surgeon: Lyn Records, MD;  Location: [facility] INVASIVE CV LAB;  Service: Cardiovascular;  Laterality: N/A;   CARDIAC CATHETERIZATION N/A 09/07/2016   Procedure: Coronary Balloon Angioplasty;  Surgeon: Lyn Records, MD;  Location: [facility] INVASIVE CV LAB;  Service: Cardiovascular;  Laterality: N/A;   CESAREAN SECTION     IRRIGATION AND DEBRIDEMENT SHOULDER Right 04/30/2015   Procedure: IRRIGATION AND DEBRIDEMENT SHOULDER;  Surgeon: Sheral Apley, MD;  Location: [facility] OR;  Service: Orthopedics;  Laterality: Right;   IRRIGATION AND DEBRIDEMENT SHOULDER Right 05/01/2015   LEFT HEART CATH AND CORONARY ANGIOGRAPHY N/A 04/25/2020   Procedure: LEFT HEART CATH AND CORONARY ANGIOGRAPHY;  Surgeon: Lyn Records, MD;  Location: [facility] INVASIVE CV LAB;  Service: Cardiovascular;  Laterality: N/A;   LEG SURGERY     SHOULDER ARTHROSCOPY Right 04/30/2015   Procedure: ARTHROSCOPY SHOULDER;  Surgeon: Sheral Apley, MD;  Location: [facility] OR;  Service: Orthopedics;  Laterality: Right;   TONSILLECTOMY     Current Outpatient Medications on File Prior to Visit  Medication Sig Dispense Refill   Accu-Chek FastClix Lancets MISC USE 1 TO  CHECK GLUCOSE 4 TIMES DAILY 102 each 2   acetaminophen (TYLENOL) 325 MG tablet Take 2 tablets (650 mg total) by mouth every 6 (six) hours as needed for mild pain, fever or headache. 30 tablet 0   albuterol (VENTOLIN HFA) 108 (90 Base) MCG/ACT inhaler Inhale 2 puffs into the lungs every 6 (six) hours as needed for wheezing or shortness of breath. 18 g 1   allopurinol (ZYLOPRIM) 100 MG tablet Take 100 mg by mouth daily.     aspirin 81 MG tablet Take 1  tablet (81 mg total) by mouth daily. 30 tablet 11   atorvastatin (LIPITOR) 20 MG tablet TAKE 1 TABLET(20 MG) BY MOUTH DAILY 90 tablet 3   BD VEO INSULIN SYRINGE U/F 31G X 15/64" 1 ML MISC USE  SYRINGE ONCE DAILY 100 each 1   Blood Glucose Monitoring Suppl (ACCU-CHEK AVIVA PLUS) w/Device KIT 1 each by Does not apply route as directed. 1 kit 0   Continuous Blood Gluc Transmit (DEXCOM G6 TRANSMITTER) MISC Inject into the skin.     dextromethorphan-guaiFENesin (TUSSIN DM) 10-100 MG/5ML liquid Take 10 mLs by mouth every 4 (four) hours as needed for cough. 180 mL 0   diclofenac Sodium (VOLTAREN) 1 % GEL Apply 4 g topically 4 (four) times daily. 350 g 1   DULoxetine (CYMBALTA) 30 MG capsule TAKE 1 CAPSULE(30 MG) BY MOUTH DAILY 90 capsule 1   fluticasone (FLOVENT HFA) 110 MCG/ACT inhaler Inhale 2 puffs into the lungs 2 (two) times daily. 1 each 12   gabapentin (NEURONTIN) 300 MG capsule TAKE 2 CAPSULES(600 MG) BY MOUTH THREE TIMES DAILY 180 capsule 2   gentamicin cream (GARAMYCIN) 0.1 % Apply to left 5th toe once daily. 15 g 0   glucose blood (ACCU-CHEK AVIVA PLUS) test strip Use as instructed to test blood sugar 4 times daily. 100 each 12   HUMULIN R U-500 KWIKPEN 500 UNIT/ML KwikPen Inject 75-80 Units into the skin 3 (three) times daily with meals. AM 80 units Noon 75 units Evening 75 units If BG above 200 add 5 units to prescribed dose 3 mL 3   hydrALAZINE (APRESOLINE) 100 MG tablet TAKE 1 TABLET(100 MG) BY MOUTH THREE TIMES DAILY 120 tablet 1   Insulin Pen Needle (PEN NEEDLES) 32G X 4 MM MISC Use to inject insulin 3 times daily. 100 each 6   isosorbide mononitrate (IMDUR) 60 MG 24 hr tablet Take 1 tablet (60 mg total) by mouth daily. 90 tablet 3   losartan (COZAAR) 100 MG tablet Take 1 tablet (100 mg total) by mouth daily. 90 tablet 3   Melatonin 10 MG TABS Take 3 tablets by mouth at bedtime as needed (sleep). 90 tablet 1   methocarbamol (ROBAXIN) 750 MG tablet Take 1 tablet (750 mg total) by mouth  every 8 (eight) hours as needed. 90 tablet 1   MOUNJARO 2.5 MG/0.5ML Pen Inject 2.5 mg into the skin once a week.     Multiple Vitamins-Minerals (MULTIVITAMIN PO) Take 1 tablet by mouth daily.     naloxone (NARCAN) nasal spray 4 mg/0.1 mL Place into both nostrils as needed (over dose).     nitroGLYCERIN (NITROSTAT) 0.4 MG SL tablet Place 1 tablet (0.4 mg total) under the tongue every 5 (five) minutes as needed for chest pain. Please keep upcoming appt in October 2022. Final Attempt 25 tablet 2   oxyCODONE-acetaminophen (PERCOCET) 10-325 MG tablet Take 1 tablet by mouth 3 (three) times daily as needed.     potassium chloride  SA (KLOR-CON) 20 MEQ tablet Take 1 tablet (20 mEq total) by mouth daily. 90 tablet 3   Semaglutide, 1 MG/DOSE, (OZEMPIC, 1 MG/DOSE,) 2 MG/1.5ML SOPN Inject 1 mg into the skin once a week.     sodium chloride (OCEAN) 0.65 % nasal spray Place 1 spray into the nose as needed for congestion.     torsemide (DEMADEX) 20 MG tablet Take 2 tablets (40 mg total) by mouth 2 (two) times daily. 360 tablet 3   Vitamin D, Ergocalciferol, (DRISDOL) 1.25 MG (50000 UNIT) CAPS capsule Take 50,000 Units by mouth once a week.     ondansetron (ZOFRAN-ODT) 4 MG disintegrating tablet Take 1 tablet (4 mg total) by mouth every 8 (eight) hours as needed. (Patient not taking: Reported on 03/08/2024) 20 tablet 0   No current facility-administered medications on file prior to visit.    Allergies  Allergen Reactions   Acetaminophen Itching and Swelling    Itching of the mouth, swelling of tongue and stomach started hurting mild   Hydrazine Yellow [Fd&C Yellow #5 (Tartrazine)] Shortness Of Breath and Swelling    Swelling mostly noticed in legs and feet, retaining urination, shortness of breaht, and minor chest pain   Lisinopril Shortness Of Breath    Was on prinzide; had sob/chest pain on it.   Pineapple Itching   Social History   Occupational History   Occupation: disabled  Tobacco Use   Smoking  status: Former    Current packs/day: 0.00    Average packs/day: 0.3 packs/day for 2.0 years (0.5 ttl pk-yrs)    Types: Cigarettes    Start date: 2006    Quit date: 2008    Years since quitting: 17.2   Smokeless tobacco: Never  Vaping Use   Vaping status: Never Used  Substance and Sexual Activity   Alcohol use: Yes    Comment: socially   Drug use: No   Sexual activity: Not on file   Family History  Problem Relation Age of Onset   Diabetes Mother    Hypertension Mother    Heart disease Mother    Diabetes Father    Heart disease Father    Stroke Maternal Grandmother    Cancer Maternal Grandmother    Breast cancer Cousin        30's   Breast cancer Cousin        50's   Immunization History  Administered Date(s) Administered   PFIZER(Purple Top)SARS-COV-2 Vaccination 11/30/2020   Pneumococcal Polysaccharide-23 02/13/2015   Tdap 07/28/2016     Review of Systems: Negative except as noted in the HPI.   Objective: There were no vitals filed for this visit.  Lindsay [name] is a pleasant 43 y.o. female in NAD. AAO X 3.  Diabetic foot exam was performed with the following findings:   Vascular Examination: Capillary refill time immediate b/l. Vascular status intact b/l with palpable pedal pulses. Pedal hair absent b/l. No pain with calf compression b/l. Skin temperature gradient WNL b/l. No cyanosis or clubbing b/l. No ischemia or gangrene noted b/l. Dependent edema noted b/l LE.  Neurological Examination: Pt has subjective symptoms of neuropathy. Protective sensation diminished with 10g monofilament b/l.  Dermatological Examination: Pedal skin with normal turgor, texture and tone b/l.  No open wounds. No interdigital macerations.   Toenails right great toe and 2-5 b/l thick, discolored, elongated with subungual debris and pain on dorsal palpation. There is noted onchyolysis of nailplate of left great toe.  The nailbed(s) remain(s) intact. There is  no erythema, no edema,  no drainage, no underlying fluctuance.   Minimal hyperkeratos(is/es) noted L 5th toe. There remains postinflammatory hyperpigmentation.  Musculoskeletal Examination: Muscle strength 5/5 to all lower extremity muscle groups bilaterally. Hammertoe deformity noted 2-5 b/l. Pes planus deformity noted bilateral LE.  Radiographs: None     Lab Results  Component Value Date   HGBA1C 9.1 (A) 09/13/2023    ADA Risk Categorization: High Risk  Patient has one or more of the following: Loss of protective sensation Absent pedal pulses Severe Foot deformity History of foot ulcer  Assessment: 1. Pain due to onychomycosis of toenails of both feet   2. Onycholysis of toenail   3. Acquired hammertoes of both feet   4. History of diabetic ulcer of foot   5. Diabetic peripheral neuropathy associated with type 2 diabetes mellitus (HCC)   6. Encounter for diabetic foot exam (HCC)     Plan: Diabetic foot examination performed today. All patient's and/or POA's questions/concerns addressed on today's visit. Toenails 2-5 bilaterally and right great toe debrided in length and girth without incident. Monitor blood glucose per PCP/Endocrinologist's recommendations.Continue soft, supportive shoe gear daily. Report any pedal injuries to medical professional. Call office if there are any questions/concerns. -Discussed need for diabetic shoes. Patient is in agreement. Rx for [employer] and Prosthetics dispensed to patient for one pair diabetic shoes and 3 pair total contact insoles. -Loose nailplate left great toe gently debrided to level of adherence. Digit cleansed with alcohol. Triple antibiotic ointment applied to nailbed followed by light dressing. No further treatment required by patient. -Patient/POA to call should there be question/concern in the interim. Return in about 3 months (around 06/08/2024).  Freddie Breech, DPM      [facility] LOCATION: [address], [place] [address]                   Office [phone]   [facility] LOCATION: [address] [place], [place] [address] Office [phone]

## 2024-03-13 NOTE — Telephone Encounter (Unsigned)
 Copied from CRM [id number]. Topic: Clinical - Medication Refill >> Mar 13, 2024 11:57 AM Izetta Dakin wrote: Most Recent Primary Care Visit:  Provider: Grayce Sessions  Department: [facility]  Visit Type: OFFICE VISIT  Date: 09/13/2023  Medication: methocarbamol (ROBAXIN) 750 MG tablet  Has the patient contacted their pharmacy? Yes (Agent: If no, request that the patient contact the pharmacy for the refill. If patient does not wish to contact the pharmacy document the reason why and proceed with request.) (Agent: If yes, when and what did the pharmacy advise?)  Is this the correct pharmacy for this prescription? Yes If no, delete pharmacy and type the correct one.  This is the patient's preferred pharmacy:  [facility] [phone] - [place], [place] - [address] AT [facility] [address] [place] [address] Phone: [phone] Fax: [phone]   Has the prescription been filled recently? No  Is the patient out of the medication? Yes  Has the patient been seen for an appointment in the last year OR does the patient have an upcoming appointment? Yes  Can we respond through MyChart? Yes  Agent: Please be advised that Rx refills may take up to 3 business days. We ask that you follow-up with your pharmacy.

## 2024-03-14 NOTE — Telephone Encounter (Signed)
 Requested medication (s) are due for refill today: yes  Requested medication (s) are on the active medication list: yes  Last refill:  09/13/23 and 03/09/23  Future visit scheduled: yes  Notes to clinic:  Unable to refill per protocol, cannot delegate.      Requested Prescriptions  Pending Prescriptions Disp Refills   Vitamin D, Ergocalciferol, (DRISDOL) 1.25 MG (50000 UNIT) CAPS capsule 5 capsule     Sig: Take 1 capsule (50,000 Units total) by mouth once a week.     Endocrinology:  Vitamins - Vitamin D Supplementation 2 Failed - 03/14/2024  1:37 PM      Failed - Manual Review: Route requests for 50,000 IU strength to the provider      Failed - Ca in normal range and within 360 days    Calcium  Date Value Ref Range Status  09/28/2023 8.8 (L) 8.9 - 10.3 mg/dL Final   Calcium, Ion  Date Value Ref Range Status  07/16/2021 1.15 1.15 - 1.40 mmol/L Final         Failed - Vitamin D in normal range and within 360 days    Vit D, 25-Hydroxy  Date Value Ref Range Status  03/05/2022 23.8 (L) 30.0 - 100.0 ng/mL Final    Comment:    Vitamin D deficiency has been defined by the Institute of Medicine and an Endocrine Society practice guideline as a level of serum 25-OH vitamin D less than 20 ng/mL (1,2). The Endocrine Society went on to further define vitamin D insufficiency as a level between 21 and 29 ng/mL (2). 1. IOM (Institute of Medicine). 2010. Dietary reference    intakes for calcium and D. [place]: The    [employer]. 2. Holick MF, Binkley [place], Bischoff-Ferrari HA, et al.    Evaluation, treatment, and prevention of vitamin D    deficiency: an Endocrine Society clinical practice    guideline. JCEM. 2011 Jul; 96(7):1911-30.          Passed - Valid encounter within last 12 months    Recent Outpatient Visits           6 months ago Muscle spasm   [place] [facility] Grayce Sessions, NP   1 year ago Diabetes mellitus with  complication [facility])   [place] [facility] Grayce Sessions, NP   1 year ago Diabetes mellitus with complication [facility])   [place] [facility] Grayce Sessions, NP   2 years ago Annual physical exam   [place] [facility] Grayce Sessions, NP   2 years ago Uncomplicated asthma, unspecified asthma severity, unspecified whether persistent   [place] [facility] Grayce Sessions, NP       Future Appointments             Tomorrow Grayce Sessions, NP  [facility]             methocarbamol (ROBAXIN) 750 MG tablet 90 tablet 1    Sig: Take 1 tablet (750 mg total) by mouth every 8 (eight) hours as needed.     Not Delegated - Analgesics:  Muscle Relaxants Failed - 03/14/2024  1:37 PM      Failed - This refill cannot be delegated      Failed - Valid encounter within last 6 months    Recent Outpatient Visits           6 months ago Muscle spasm  [place] [facility] Grayce Sessions, NP   1 year ago Diabetes mellitus with complication [facility])   [place] [facility] Grayce Sessions, NP   1 year ago Diabetes mellitus with complication [facility])   [place] [facility] Grayce Sessions, NP   2 years ago Annual physical exam   [place] [facility] Grayce Sessions, NP   2 years ago Uncomplicated asthma, unspecified asthma severity, unspecified whether persistent   [place] [facility] Grayce Sessions, NP       Future Appointments             Tomorrow Grayce Sessions, NP [place] [facility]

## 2024-03-14 NOTE — Telephone Encounter (Signed)
 Requested medications are due for refill today.  No pt last prescribed 750 mg  Requested medications are on the active medications list.  no  Last refill. unsure  Future visit scheduled.   yes  Notes to clinic.  Pt was last prescribed 750 mg on 09/13/2023 #90 1 rf. Refill/refusal not delegated.    Requested Prescriptions  Pending Prescriptions Disp Refills   methocarbamol (ROBAXIN) 500 MG tablet [Pharmacy Med Name: METHOCARBAMOL 500MG  TABLETS] 90 tablet 1    Sig: TAKE 1 TABLET(500 MG) BY MOUTH EVERY 8 HOURS AS NEEDED FOR MUSCLE SPASMS     Not Delegated - Analgesics:  Muscle Relaxants Failed - 03/14/2024  5:43 PM      Failed - This refill cannot be delegated      Failed - Valid encounter within last 6 months    Recent Outpatient Visits           6 months ago Muscle spasm   [place] [facility] Grayce Sessions, NP   1 year ago Diabetes mellitus with complication [facility])   [place] [facility] Grayce Sessions, NP   1 year ago Diabetes mellitus with complication [facility])   [place] [facility] Grayce Sessions, NP   2 years ago Annual physical exam   [place] [facility] Grayce Sessions, NP   2 years ago Uncomplicated asthma, unspecified asthma severity, unspecified whether persistent   [place] [facility] Grayce Sessions, NP       Future Appointments             Tomorrow Grayce Sessions, NP [place] [facility]

## 2024-03-15 ENCOUNTER — Encounter (INDEPENDENT_AMBULATORY_CARE_PROVIDER_SITE_OTHER): Payer: Self-pay | Admitting: Primary Care

## 2024-03-15 ENCOUNTER — Ambulatory Visit (INDEPENDENT_AMBULATORY_CARE_PROVIDER_SITE_OTHER): Admitting: Primary Care

## 2024-03-15 VITALS — HR 87

## 2024-03-15 DIAGNOSIS — E1165 Type 2 diabetes mellitus with hyperglycemia: Secondary | ICD-10-CM | POA: Diagnosis not present

## 2024-03-15 DIAGNOSIS — R202 Paresthesia of skin: Secondary | ICD-10-CM

## 2024-03-15 DIAGNOSIS — Z760 Encounter for issue of repeat prescription: Secondary | ICD-10-CM

## 2024-03-15 DIAGNOSIS — J45909 Unspecified asthma, uncomplicated: Secondary | ICD-10-CM

## 2024-03-15 DIAGNOSIS — F4323 Adjustment disorder with mixed anxiety and depressed mood: Secondary | ICD-10-CM

## 2024-03-15 DIAGNOSIS — E7800 Pure hypercholesterolemia, unspecified: Secondary | ICD-10-CM | POA: Diagnosis not present

## 2024-03-15 DIAGNOSIS — I10 Essential (primary) hypertension: Secondary | ICD-10-CM

## 2024-03-15 DIAGNOSIS — R200 Anesthesia of skin: Secondary | ICD-10-CM | POA: Diagnosis not present

## 2024-03-15 DIAGNOSIS — M62838 Other muscle spasm: Secondary | ICD-10-CM

## 2024-03-15 DIAGNOSIS — Z794 Long term (current) use of insulin: Secondary | ICD-10-CM | POA: Diagnosis not present

## 2024-03-15 MED ORDER — DULOXETINE HCL 30 MG PO CPEP: 30.0000 mg | ORAL_CAPSULE | Freq: Two times a day (BID) | ORAL | 1 refills | Status: DC

## 2024-03-15 MED ORDER — TIZANIDINE HCL 4 MG PO TABS
4.0000 mg | ORAL_TABLET | Freq: Four times a day (QID) | ORAL | 0 refills | Status: DC | PRN
Start: 2024-03-15 — End: 2024-05-03

## 2024-03-15 MED ORDER — ALBUTEROL SULFATE HFA 108 (90 BASE) MCG/ACT IN AERS: 2.0000 | INHALATION_SPRAY | Freq: Four times a day (QID) | RESPIRATORY_TRACT | 1 refills | Status: AC | PRN

## 2024-03-15 MED ORDER — TORSEMIDE 20 MG PO TABS: 40.0000 mg | ORAL_TABLET | Freq: Two times a day (BID) | ORAL | 3 refills | Status: AC

## 2024-03-15 NOTE — Progress Notes (Signed)
 [facility]  Lindsay [name], is a 43 y.o. female  ZOX:[id number]  [id number]  DOB - 01/07/1981  Chief Complaint  Patient presents with   Medical Management of Chronic Issues    Patient needing lab work done, medication fro toe nail fungal    Medication Refill       Subjective:   Lindsay [name] is a 43 y.o. female here today for a follow up visit. Patient has No headache, No chest pain, No abdominal pain - No Nausea, No new weakness tingling or numbness, No Cough - shortness of breath HPI  No problems updated.  Comprehensive ROS Pertinent positive and negative noted in HPI   Allergies  Allergen Reactions   Acetaminophen Itching and Swelling    Itching of the mouth, swelling of tongue and stomach started hurting mild   Hydrazine Yellow [Fd&C Yellow #5 (Tartrazine)] Shortness Of Breath and Swelling    Swelling mostly noticed in legs and feet, retaining urination, shortness of breaht, and minor chest pain   Lisinopril Shortness Of Breath    Was on prinzide; had sob/chest pain on it.   Pineapple Itching    Past Medical History:  Diagnosis Date   ARF (acute renal failure) (HCC) 04/2015   Asthma    Cellulitis of right upper extremity    Coronary artery disease    Diabetes mellitus    insulin dependent   Hyperlipidemia LDL goal <70    Hypertension    Obesity    S/P angioplasty with stent 08/2016   DES to mLAD and PTCA only to 2nd diag ostium.     Current Outpatient Medications on File Prior to Visit  Medication Sig Dispense Refill   Accu-Chek FastClix Lancets MISC USE 1 TO CHECK GLUCOSE 4 TIMES DAILY 102 each 2   acetaminophen (TYLENOL) 325 MG tablet Take 2 tablets (650 mg total) by mouth every 6 (six) hours as needed for mild pain, fever or headache. 30 tablet 0   albuterol (VENTOLIN HFA) 108 (90 Base) MCG/ACT inhaler Inhale 2 puffs into the lungs every 6 (six) hours as needed for wheezing or shortness of breath. 18 g 1   allopurinol  (ZYLOPRIM) 100 MG tablet Take 100 mg by mouth daily.     aspirin 81 MG tablet Take 1 tablet (81 mg total) by mouth daily. 30 tablet 11   atorvastatin (LIPITOR) 20 MG tablet TAKE 1 TABLET(20 MG) BY MOUTH DAILY 90 tablet 3   BD VEO INSULIN SYRINGE U/F 31G X 15/64" 1 ML MISC USE  SYRINGE ONCE DAILY 100 each 1   Blood Glucose Monitoring Suppl (ACCU-CHEK AVIVA PLUS) w/Device KIT 1 each by Does not apply route as directed. 1 kit 0   Continuous Blood Gluc Transmit (DEXCOM G6 TRANSMITTER) MISC Inject into the skin.     diclofenac Sodium (VOLTAREN) 1 % GEL Apply 4 g topically 4 (four) times daily. 350 g 1   DULoxetine (CYMBALTA) 30 MG capsule TAKE 1 CAPSULE(30 MG) BY MOUTH DAILY 90 capsule 1   fluticasone (FLOVENT HFA) 110 MCG/ACT inhaler Inhale 2 puffs into the lungs 2 (two) times daily. 1 each 12   gabapentin (NEURONTIN) 300 MG capsule TAKE 2 CAPSULES(600 MG) BY MOUTH THREE TIMES DAILY 180 capsule 2   gentamicin cream (GARAMYCIN) 0.1 % Apply to left 5th toe once daily. 15 g 0   glucose blood (ACCU-CHEK AVIVA PLUS) test strip Use as instructed to test blood sugar 4 times daily. 100 each 12   HUMULIN R U-500 [facility]  500 UNIT/ML KwikPen Inject 75-80 Units into the skin 3 (three) times daily with meals. AM 80 units Noon 75 units Evening 75 units If BG above 200 add 5 units to prescribed dose 3 mL 3   hydrALAZINE (APRESOLINE) 100 MG tablet TAKE 1 TABLET(100 MG) BY MOUTH THREE TIMES DAILY 120 tablet 1   Insulin Pen Needle (PEN NEEDLES) 32G X 4 MM MISC Use to inject insulin 3 times daily. 100 each 6   isosorbide mononitrate (IMDUR) 60 MG 24 hr tablet Take 1 tablet (60 mg total) by mouth daily. 90 tablet 3   losartan (COZAAR) 100 MG tablet Take 1 tablet (100 mg total) by mouth daily. 90 tablet 3   Melatonin 10 MG TABS Take 3 tablets by mouth at bedtime as needed (sleep). 90 tablet 1   methocarbamol (ROBAXIN) 750 MG tablet Take 1 tablet (750 mg total) by mouth every 8 (eight) hours as needed. 90 tablet 1    MOUNJARO 2.5 MG/0.5ML Pen Inject 2.5 mg into the skin once a week.     Multiple Vitamins-Minerals (MULTIVITAMIN PO) Take 1 tablet by mouth daily.     naloxone (NARCAN) nasal spray 4 mg/0.1 mL Place into both nostrils as needed (over dose).     nitroGLYCERIN (NITROSTAT) 0.4 MG SL tablet Place 1 tablet (0.4 mg total) under the tongue every 5 (five) minutes as needed for chest pain. Please keep upcoming appt in October 2022. Final Attempt 25 tablet 2   ondansetron (ZOFRAN-ODT) 4 MG disintegrating tablet Take 1 tablet (4 mg total) by mouth every 8 (eight) hours as needed. 20 tablet 0   oxyCODONE-acetaminophen (PERCOCET) 10-325 MG tablet Take 1 tablet by mouth 3 (three) times daily as needed.     potassium chloride SA (KLOR-CON) 20 MEQ tablet Take 1 tablet (20 mEq total) by mouth daily. 90 tablet 3   Semaglutide, 1 MG/DOSE, (OZEMPIC, 1 MG/DOSE,) 2 MG/1.5ML SOPN Inject 1 mg into the skin once a week.     sodium chloride (OCEAN) 0.65 % nasal spray Place 1 spray into the nose as needed for congestion.     torsemide (DEMADEX) 20 MG tablet Take 2 tablets (40 mg total) by mouth 2 (two) times daily. 360 tablet 3   Vitamin D, Ergocalciferol, (DRISDOL) 1.25 MG (50000 UNIT) CAPS capsule Take 50,000 Units by mouth once a week.     dextromethorphan-guaiFENesin (TUSSIN DM) 10-100 MG/5ML liquid Take 10 mLs by mouth every 4 (four) hours as needed for cough. (Patient not taking: Reported on 03/15/2024) 180 mL 0   No current facility-administered medications on file prior to visit.   Health Maintenance  Topic Date Due   Yearly kidney health urinalysis for diabetes  02/18/2018   COVID-19 Vaccine (2 - Pfizer risk series) 12/21/2020   Flu Shot  07/22/2023   Pneumococcal Vaccination (2 of 2 - PCV) 03/15/2025*   Yearly kidney function blood test for diabetes  09/27/2024   Eye exam for diabetics  11/23/2024   Complete foot exam   03/08/2025   Pap with HPV screening  06/03/2026   DTaP/Tdap/Td vaccine (2 - Td or Tdap)  07/28/2026   Hepatitis C Screening  Completed   HIV Screening  Completed   HPV Vaccine  Aged Out   Hemoglobin A1C  Discontinued  *Topic was postponed. The date shown is not the original due date.    Objective:   Vitals:   03/15/24 1547  Pulse: 87  SpO2: 92%   BP Readings from Last 3 Encounters:  09/28/23 (!) 141/86  09/13/23 122/74  04/28/23 (!) 167/86      Physical Exam Vitals reviewed.  Constitutional:      Appearance: Normal appearance. She is obese.  HENT:     Head: Normocephalic.     Right Ear: Tympanic membrane, ear canal and external ear normal.     Left Ear: Tympanic membrane, ear canal and external ear normal.     Nose: Nose normal.     Mouth/Throat:     Mouth: Mucous membranes are moist.  Eyes:     Extraocular Movements: Extraocular movements intact.     Pupils: Pupils are equal, round, and reactive to light.  Cardiovascular:     Rate and Rhythm: Normal rate.  Pulmonary:     Effort: Pulmonary effort is normal.     Breath sounds: Normal breath sounds.  Abdominal:     General: Bowel sounds are normal.     Palpations: Abdomen is soft.  Musculoskeletal:        General: Normal range of motion.     Cervical back: Normal range of motion.  Skin:    General: Skin is warm and dry.  Neurological:     Mental Status: She is alert and oriented to person, place, and time.  Psychiatric:        Mood and Affect: Mood normal.        Behavior: Behavior normal.        Thought Content: Thought content normal.     Assessment & Plan  Mckensie was seen today for medical management of chronic issues and medication refill.  Diagnoses and all orders for this visit:  Uncomplicated asthma, unspecified asthma severity, unspecified whether persistent -     albuterol (VENTOLIN HFA) 108 (90 Base) MCG/ACT inhaler; Inhale 2 puffs into the lungs every 6 (six) hours as needed for wheezing or shortness of breath.  Medication refill -     albuterol (VENTOLIN HFA) 108 (90 Base)  MCG/ACT inhaler; Inhale 2 puffs into the lungs every 6 (six) hours as needed for wheezing or shortness of breath. -     DULoxetine (CYMBALTA) 30 MG capsule; Take 1 capsule (30 mg total) by mouth 2 (two) times daily.  Muscle spasm -     tiZANidine (ZANAFLEX) 4 MG tablet; Take 1 tablet (4 mg total) by mouth every 6 (six) hours as needed for muscle spasms.  Adjustment disorder with mixed anxiety and depressed mood  Numbness and tingling -     DULoxetine (CYMBALTA) 30 MG capsule; Take 1 capsule (30 mg total) by mouth 2 (two) times daily.  Pure hypercholesterolemia -     Lipid Panel  Essential hypertension BP goal - < 130/80 Explained that having normal blood pressure is the goal and medications are helping to get to goal and maintain normal blood pressure. DIET: Limit salt intake, read nutrition labels to check salt content, limit fried and high fatty foods  Avoid using multisymptom OTC cold preparations that generally contain sudafed which can rise BP. Consult with pharmacist on best cold relief products to use for persons with HTN EXERCISE Discussed incorporating exercise such as walking - 30 minutes most days of the week and can do in 10 minute intervals    -     CMP14+EGFR  Other orders -     torsemide (DEMADEX) 20 MG tablet; Take 2 tablets (40 mg total) by mouth 2 (two) times daily.  Patient have been counseled extensively about nutrition and exercise. Other issues discussed during this  visit include: low cholesterol diet, weight control and daily exercise, foot care, annual eye examinations at Ophthalmology, importance of adherence with medications and regular follow-up. We also discussed long term complications of uncontrolled diabetes and hypertension.   2 weeks Bp   The patient was given clear instructions to go to ER or return to medical center if symptoms don't improve, worsen or new problems develop. The patient verbalized understanding. The patient was told to call to get lab  results if they haven't heard anything in the next week.   This note has been created with Education officer, environmental. Any transcriptional errors are unintentional.   Grayce Sessions, NP 03/15/2024, 4:23 PM

## 2024-03-16 ENCOUNTER — Encounter (INDEPENDENT_AMBULATORY_CARE_PROVIDER_SITE_OTHER): Payer: Self-pay | Admitting: Primary Care

## 2024-03-16 ENCOUNTER — Other Ambulatory Visit (INDEPENDENT_AMBULATORY_CARE_PROVIDER_SITE_OTHER): Payer: Self-pay | Admitting: Primary Care

## 2024-03-16 DIAGNOSIS — Z760 Encounter for issue of repeat prescription: Secondary | ICD-10-CM

## 2024-03-16 LAB — LIPID PANEL
Chol/HDL Ratio: 3.5 ratio (ref 0.0–4.4)
Cholesterol, Total: 176 mg/dL (ref 100–199)
HDL: 51 mg/dL (ref >39)
LDL Chol Calc (NIH): 100 mg/dL — ABNORMAL HIGH (ref 0–99)
Triglycerides: 143 mg/dL (ref 0–149)
VLDL Cholesterol Cal: 25 mg/dL (ref 5–40)

## 2024-03-16 LAB — CMP14+EGFR
ALT: 17 IU/L (ref 0–32)
AST: 21 IU/L (ref 0–40)
Albumin: 3.9 g/dL (ref 3.9–4.9)
Alkaline Phosphatase: 69 IU/L (ref 44–121)
BUN/Creatinine Ratio: 18 (ref 9–23)
BUN: 19 mg/dL (ref 6–24)
Bilirubin Total: 0.3 mg/dL (ref 0.0–1.2)
CO2: 27 mmol/L (ref 20–29)
Calcium: 9.1 mg/dL (ref 8.7–10.2)
Chloride: 96 mmol/L (ref 96–106)
Creatinine, Ser: 1.07 mg/dL — ABNORMAL HIGH (ref 0.57–1.00)
Globulin, Total: 3 g/dL (ref 1.5–4.5)
Glucose: 237 mg/dL — ABNORMAL HIGH (ref 70–99)
Potassium: 4.2 mmol/L (ref 3.5–5.2)
Sodium: 138 mmol/L (ref 134–144)
Total Protein: 6.9 g/dL (ref 6.0–8.5)
eGFR: 67 mL/min/{1.73_m2} (ref >59)

## 2024-03-16 MED ORDER — ATORVASTATIN CALCIUM 20 MG PO TABS: 20.0000 mg | ORAL_TABLET | Freq: Every day | ORAL | 3 refills | Status: DC

## 2024-03-17 ENCOUNTER — Other Ambulatory Visit (INDEPENDENT_AMBULATORY_CARE_PROVIDER_SITE_OTHER): Payer: Self-pay | Admitting: Primary Care

## 2024-03-17 DIAGNOSIS — Z760 Encounter for issue of repeat prescription: Secondary | ICD-10-CM

## 2024-03-17 MED ORDER — ATORVASTATIN CALCIUM 40 MG PO TABS: 40.0000 mg | ORAL_TABLET | Freq: Every day | ORAL | 1 refills | Status: DC

## 2024-03-22 ENCOUNTER — Telehealth (INDEPENDENT_AMBULATORY_CARE_PROVIDER_SITE_OTHER): Payer: Self-pay | Admitting: Primary Care

## 2024-03-22 DIAGNOSIS — G4733 Obstructive sleep apnea (adult) (pediatric): Secondary | ICD-10-CM | POA: Diagnosis not present

## 2024-03-22 NOTE — Telephone Encounter (Signed)
 Called pt to confirm appt. Pt did not answer and VM left.

## 2024-03-28 ENCOUNTER — Telehealth (INDEPENDENT_AMBULATORY_CARE_PROVIDER_SITE_OTHER): Payer: Self-pay | Admitting: Primary Care

## 2024-03-28 NOTE — Telephone Encounter (Signed)
 Called pt to confirm appt. Pt will be present.

## 2024-03-29 ENCOUNTER — Encounter (INDEPENDENT_AMBULATORY_CARE_PROVIDER_SITE_OTHER): Payer: Self-pay

## 2024-03-29 ENCOUNTER — Ambulatory Visit (INDEPENDENT_AMBULATORY_CARE_PROVIDER_SITE_OTHER): Admitting: Primary Care

## 2024-03-29 DIAGNOSIS — F419 Anxiety disorder, unspecified: Secondary | ICD-10-CM | POA: Diagnosis not present

## 2024-03-29 DIAGNOSIS — F330 Major depressive disorder, recurrent, mild: Secondary | ICD-10-CM | POA: Diagnosis not present

## 2024-04-06 ENCOUNTER — Other Ambulatory Visit (INDEPENDENT_AMBULATORY_CARE_PROVIDER_SITE_OTHER): Payer: Self-pay

## 2024-04-06 MED ORDER — HYDRALAZINE HCL 100 MG PO TABS: 100.0000 mg | ORAL_TABLET | Freq: Three times a day (TID) | ORAL | 1 refills | Status: DC

## 2024-04-06 NOTE — Telephone Encounter (Signed)
 Received request from pharmacy for medication refill   Please refill if appropriate. Last rx was sent in 01/20/2023

## 2024-04-10 DIAGNOSIS — M25511 Pain in right shoulder: Secondary | ICD-10-CM | POA: Diagnosis not present

## 2024-04-10 DIAGNOSIS — E611 Iron deficiency: Secondary | ICD-10-CM | POA: Diagnosis not present

## 2024-04-10 DIAGNOSIS — E119 Type 2 diabetes mellitus without complications: Secondary | ICD-10-CM | POA: Diagnosis not present

## 2024-04-10 DIAGNOSIS — M47816 Spondylosis without myelopathy or radiculopathy, lumbar region: Secondary | ICD-10-CM | POA: Diagnosis not present

## 2024-04-10 DIAGNOSIS — Z3200 Encounter for pregnancy test, result unknown: Secondary | ICD-10-CM | POA: Diagnosis not present

## 2024-04-10 DIAGNOSIS — M546 Pain in thoracic spine: Secondary | ICD-10-CM | POA: Diagnosis not present

## 2024-04-10 DIAGNOSIS — Z6841 Body Mass Index (BMI) 40.0 and over, adult: Secondary | ICD-10-CM | POA: Diagnosis not present

## 2024-04-10 DIAGNOSIS — G5601 Carpal tunnel syndrome, right upper limb: Secondary | ICD-10-CM | POA: Diagnosis not present

## 2024-04-12 NOTE — Patient Instructions (Signed)
 DUE TO COVID-19 ONLY TWO VISITORS  (aged 43 and older)  ARE ALLOWED TO COME WITH YOU AND STAY IN THE WAITING ROOM ONLY DURING PRE OP AND PROCEDURE.   **NO VISITORS ARE ALLOWED IN THE SHORT STAY AREA OR RECOVERY ROOM!!**  IF YOU WILL BE ADMITTED INTO THE HOSPITAL YOU ARE ALLOWED ONLY FOUR SUPPORT PEOPLE DURING VISITATION HOURS ONLY (7 AM -8PM)   The support person(s) must pass our screening, gel in and out, and wear a mask at all times, including in the patient's room. Patients must also wear a mask when staff or their support person are in the room. Visitors GUEST BADGE MUST BE WORN VISIBLY  One adult visitor may remain with you overnight and MUST be in the room by 8 P.M.     Your procedure is scheduled on: 04/25/24   Report to [facility] Main Entrance    Report to admitting at : 6:30 AM   Call this number if you have problems the morning of surgery [phone]   Do not eat food :After Midnight.   After Midnight you may have the following liquids until : 5:45 AM DAY OF SURGERY  Water  Black Coffee (sugar ok, NO MILK/CREAM OR CREAMERS)  Tea (sugar ok, NO MILK/CREAM OR CREAMERS) regular and decaf                             Plain Jell-O (NO RED)                                           Fruit ices (not with fruit pulp, NO RED)                                     Popsicles (NO RED)                                                                  Juice: apple, WHITE grape, WHITE cranberry Sports drinks like Gatorade (NO RED)    The day of surgery:  Drink ONE (1) Pre-Surgery Clear G2 at : 5:45 AM the morning of surgery. Drink in one sitting. Do not sip.  This drink was given to you during your hospital  pre-op appointment visit. Nothing else to drink after completing the  Pre-Surgery Clear Ensure or G2.          If you have questions, please contact your surgeon's office.  FOLLOW  ANY ADDITIONAL PRE OP INSTRUCTIONS YOU RECEIVED FROM YOUR SURGEON'S OFFICE!!!   Oral Hygiene  is also important to reduce your risk of infection.                                    Remember - BRUSH YOUR TEETH THE MORNING OF SURGERY WITH YOUR REGULAR TOOTHPASTE  DENTURES WILL BE REMOVED PRIOR TO SURGERY PLEASE DO NOT APPLY "Poly grip" OR ADHESIVES!!!   Do NOT smoke after Midnight   Take these medicines the morning  of surgery with A SIP OF WATER : isosorbide ,gabapentin ,duloxetine ,hydralazin,allopurinol.Use inhalers as usual and bring them.Tylenol  as needed.  How to Manage Your Diabetes Before and After Surgery  Why is it important to control my blood sugar before and after surgery? Improving blood sugar levels before and after surgery helps healing and can limit problems. A way of improving blood sugar control is eating a healthy diet by:  Eating less sugar and carbohydrates  Increasing activity/exercise  Talking with your doctor about reaching your blood sugar goals High blood sugars (greater than 180 mg/dL) can raise your risk of infections and slow your recovery, so you will need to focus on controlling your diabetes during the weeks before surgery. Make sure that the doctor who takes care of your diabetes knows about your planned surgery including the date and location.  How do I manage my blood sugar before surgery? Check your blood sugar at least 4 times a day, starting 2 days before surgery, to make sure that the level is not too high or low. Check your blood sugar the morning of your surgery when you wake up and every 2 hours until you get to the Short Stay unit. If your blood sugar is less than 70 mg/dL, you will need to treat for low blood sugar: Do not take insulin . Treat a low blood sugar (less than 70 mg/dL) with  cup of clear juice (cranberry or apple), 4 glucose tablets, OR glucose gel. Recheck blood sugar in 15 minutes after treatment (to make sure it is greater than 70 mg/dL). If your blood sugar is not greater than 70 mg/dL on recheck, call [phone] for further  instructions. Report your blood sugar to the short stay nurse when you get to Short Stay.  If you are admitted to the hospital after surgery: Your blood sugar will be checked by the staff and you will probably be given insulin  after surgery (instead of oral diabetes medicines) to make sure you have good blood sugar levels. The goal for blood sugar control after surgery is 80-180 mg/dL.   WHAT DO I DO ABOUT MY DIABETES MEDICATION?   THE NIGHT BEFORE SURGERY, DO NOT take humulin  insulin .      THE MORNING OF SURGERY, If your CBG is greater than 220 mg/dL, you may take  of your sliding scale  (correction) dose of insulin .  DO NOT TAKE THE FOLLOWING 7 DAYS PRIOR TO SURGERY: Ozempic, Wegovy, Rybelsus (Semaglutide), Byetta  (exenatide ), Bydureon  (exenatide  ER), Victoza , Saxenda  (liraglutide ), or Trulicity (dulaglutide) Mounjaro (Tirzepatide) Adlyxin (Lixisenatide), Polyethylene Glycol Loxenatide. HOLD mounjaro and semaglutide after: 04/17/24             You may not have any metal on your body including hair pins, jewelry, and body piercing             Do not wear make-up, lotions, powders, perfumes/cologne, or deodorant  Do not wear nail polish including gel and S&S, artificial/acrylic nails, or any other type of covering on natural nails including finger and toenails. If you have artificial nails, gel coating, etc. that needs to be removed by a nail salon please have this removed prior to surgery or surgery may need to be canceled/ delayed if the surgeon/ anesthesia feels like they are unable to be safely monitored.   Do not shave  48 hours prior to surgery.    Do not bring valuables to the hospital. [facility] IS NOT             RESPONSIBLE  FOR VALUABLES.   Contacts, glasses, or bridgework may not be worn into surgery.   Bring small overnight bag day of surgery.   DO NOT BRING YOUR HOME MEDICATIONS TO THE HOSPITAL. PHARMACY WILL DISPENSE MEDICATIONS LISTED ON YOUR MEDICATION LIST TO  YOU DURING YOUR ADMISSION IN THE HOSPITAL!    Patients discharged on the day of surgery will not be allowed to drive home.  Someone NEEDS to stay with you for the first 24 hours after anesthesia.   Special Instructions: Bring a copy of your healthcare power of attorney and living will documents         the day of surgery if you haven't scanned them before.              Please read over the following fact sheets you were given: IF YOU HAVE QUESTIONS ABOUT YOUR PRE-OP INSTRUCTIONS PLEASE CALL [phone]    [facility] - Preparing for Surgery Before surgery, you can play an important role.  Because skin is not sterile, your skin needs to be as free of germs as possible.  You can reduce the number of germs on your skin by washing with CHG (chlorahexidine gluconate) soap before surgery.  CHG is an antiseptic cleaner which kills germs and bonds with the skin to continue killing germs even after washing. Please DO NOT use if you have an allergy to CHG or antibacterial soaps.  If your skin becomes reddened/irritated stop using the CHG and inform your nurse when you arrive at Short Stay. Do not shave (including legs and underarms) for at least 48 hours prior to the first CHG shower.  You may shave your face/neck. Please follow these instructions carefully:  1.  Shower with CHG Soap the night before surgery and the  morning of Surgery.  2.  If you choose to wash your hair, wash your hair first as usual with your  normal  shampoo.  3.  After you shampoo, rinse your hair and body thoroughly to remove the  shampoo.                           4.  Use CHG as you would any other liquid soap.  You can apply chg directly  to the skin and wash                       Gently with a scrungie or clean washcloth.  5.  Apply the CHG Soap to your body ONLY FROM THE NECK DOWN.   Do not use on face/ open                           Wound or open sores. Avoid contact with eyes, ears mouth and genitals (private parts).                        Wash face,  Genitals (private parts) with your normal soap.             6.  Wash thoroughly, paying special attention to the area where your surgery  will be performed.  7.  Thoroughly rinse your body with warm water  from the neck down.  8.  DO NOT shower/wash with your normal soap after using and rinsing off  the CHG Soap.                9.  Deatra Face  yourself dry with a clean towel.            10.  Wear clean pajamas.            11.  Place clean sheets on your bed the night of your first shower and do not  sleep with pets. Day of Surgery : Do not apply any lotions/deodorants the morning of surgery.  Please wear clean clothes to the hospital/surgery center.  FAILURE TO FOLLOW THESE INSTRUCTIONS MAY RESULT IN THE CANCELLATION OF YOUR SURGERY PATIENT SIGNATURE_________________________________  NURSE SIGNATURE__________________________________  ________________________________________________________________________  Benjamen Brand  An incentive spirometer is a tool that can help keep your lungs clear and active. This tool measures how well you are filling your lungs with each breath. Taking long deep breaths may help reverse or decrease the chance of developing breathing (pulmonary) problems (especially infection) following: A long period of time when you are unable to move or be active. BEFORE THE PROCEDURE  If the spirometer includes an indicator to show your best effort, your nurse or respiratory therapist will set it to a desired goal. If possible, sit up straight or lean slightly forward. Try not to slouch. Hold the incentive spirometer in an upright position. INSTRUCTIONS FOR USE  Sit on the edge of your bed if possible, or sit up as far as you can in bed or on a chair. Hold the incentive spirometer in an upright position. Breathe out normally. Place the mouthpiece in your mouth and seal your lips tightly around it. Breathe in slowly and as deeply as possible, raising  the piston or the ball toward the top of the column. Hold your breath for 3-5 seconds or for as long as possible. Allow the piston or ball to fall to the bottom of the column. Remove the mouthpiece from your mouth and breathe out normally. Rest for a few seconds and repeat Steps 1 through 7 at least 10 times every 1-2 hours when you are awake. Take your time and take a few normal breaths between deep breaths. The spirometer may include an indicator to show your best effort. Use the indicator as a goal to work toward during each repetition. After each set of 10 deep breaths, practice coughing to be sure your lungs are clear. If you have an incision (the cut made at the time of surgery), support your incision when coughing by placing a pillow or rolled up towels firmly against it. Once you are able to get out of bed, walk around indoors and cough well. You may stop using the incentive spirometer when instructed by your caregiver.  RISKS AND COMPLICATIONS Take your time so you do not get dizzy or light-headed. If you are in pain, you may need to take or ask for pain medication before doing incentive spirometry. It is harder to take a deep breath if you are having pain. AFTER USE Rest and breathe slowly and easily. It can be helpful to keep track of a log of your progress. Your caregiver can provide you with a simple table to help with this. If you are using the spirometer at home, follow these instructions: SEEK MEDICAL CARE IF:  You are having difficultly using the spirometer. You have trouble using the spirometer as often as instructed. Your pain medication is not giving enough relief while using the spirometer. You develop fever of 100.5 F (38.1 C) or higher. SEEK IMMEDIATE MEDICAL CARE IF:  You cough up bloody sputum that had not been present before. You  develop fever of 102 F (38.9 C) or greater. You develop worsening pain at or near the incision site. MAKE SURE YOU:  Understand these  instructions. Will watch your condition. Will get help right away if you are not doing well or get worse. Document Released: 04/19/2007 Document Revised: 02/29/2012 Document Reviewed: 06/20/2007 [facility] Patient Information 2014 [place], [place].   ________________________________________________________________________

## 2024-04-13 ENCOUNTER — Encounter (HOSPITAL_COMMUNITY)
Admission: RE | Admit: 2024-04-13 | Discharge: 2024-04-13 | Disposition: A | Discharge status: Home / Self Care | Source: Ambulatory Visit | Attending: Orthopedic Surgery | Admitting: Orthopedic Surgery

## 2024-04-13 ENCOUNTER — Telehealth (INDEPENDENT_AMBULATORY_CARE_PROVIDER_SITE_OTHER): Payer: Self-pay | Admitting: Primary Care

## 2024-04-13 ENCOUNTER — Encounter (HOSPITAL_COMMUNITY): Payer: Self-pay

## 2024-04-13 ENCOUNTER — Other Ambulatory Visit: Payer: Self-pay

## 2024-04-13 VITALS — BP 119/55 | HR 110 | Temp 98.4°F | Ht 67.5 in | Wt 372.0 lb

## 2024-04-13 DIAGNOSIS — E118 Type 2 diabetes mellitus with unspecified complications: Secondary | ICD-10-CM | POA: Diagnosis not present

## 2024-04-13 DIAGNOSIS — I10 Essential (primary) hypertension: Secondary | ICD-10-CM | POA: Diagnosis not present

## 2024-04-13 DIAGNOSIS — Z01818 Encounter for other preprocedural examination: Secondary | ICD-10-CM | POA: Diagnosis not present

## 2024-04-13 HISTORY — DX: Other complications of anesthesia, initial encounter: T88.59XA

## 2024-04-13 HISTORY — DX: Anemia, unspecified: D64.9

## 2024-04-13 HISTORY — DX: Unspecified osteoarthritis, unspecified site: M19.90

## 2024-04-13 HISTORY — DX: Sleep apnea, unspecified: G47.30

## 2024-04-13 HISTORY — DX: Anxiety disorder, unspecified: F41.9

## 2024-04-13 HISTORY — DX: Depression, unspecified: F32.A

## 2024-04-13 LAB — BASIC METABOLIC PANEL WITH GFR
Anion gap: 17 — ABNORMAL HIGH (ref 5–15)
BUN: 32 mg/dL — ABNORMAL HIGH (ref 6–20)
CO2: 22 mmol/L (ref 22–32)
Calcium: 8.3 mg/dL — ABNORMAL LOW (ref 8.9–10.3)
Chloride: 94 mmol/L — ABNORMAL LOW (ref 98–111)
Creatinine, Ser: 1.73 mg/dL — ABNORMAL HIGH (ref 0.44–1.00)
GFR, Estimated: 37 mL/min — ABNORMAL LOW (ref >60)
Glucose, Bld: 513 mg/dL (ref 70–99)
Potassium: 4 mmol/L (ref 3.5–5.1)
Sodium: 133 mmol/L — ABNORMAL LOW (ref 135–145)

## 2024-04-13 LAB — CBC
HCT: 39.6 % (ref 36.0–46.0)
Hemoglobin: 12.3 g/dL (ref 12.0–15.0)
MCH: 25.3 pg — ABNORMAL LOW (ref 26.0–34.0)
MCHC: 31.1 g/dL (ref 30.0–36.0)
MCV: 81.3 fL (ref 80.0–100.0)
Platelets: 270 10*3/uL (ref 150–400)
RBC: 4.87 MIL/uL (ref 3.87–5.11)
RDW: 14.7 % (ref 11.5–15.5)
WBC: 12.6 10*3/uL — ABNORMAL HIGH (ref 4.0–10.5)
nRBC: 0 % (ref 0.0–0.2)

## 2024-04-13 LAB — GLUCOSE, CAPILLARY: Glucose-Capillary: 491 mg/dL — ABNORMAL HIGH (ref 70–99)

## 2024-04-13 LAB — HEMOGLOBIN A1C
Hgb A1c MFr Bld: 9.8 % — ABNORMAL HIGH (ref 4.8–5.6)
Mean Plasma Glucose: 234.56 mg/dL

## 2024-04-13 NOTE — Progress Notes (Signed)
 CRITICAL RESULT PROVIDER NOTIFICATION  Test performed and critical result:  Glucose blood: 513  Date and time result received:  04/13/24 @ 12:45 PM  Provider name/title: Dr. Randal Bury  Date and time provider notified: 04/13/24@ 12:48 PM  Date and time provider responded:   Provider response:Evaluate remotely

## 2024-04-13 NOTE — Progress Notes (Addendum)
 For Anesthesia: PCP - Marius Siemens, NP  Cardiologist - Jann Melody, MD . Holli Lunger: 03/09/23 Clearance: Jude Norton, NP  : 01/11/24 Bowel Prep reminder:  Chest x-ray - 09/28/23 EKG - 04/13/24  Stress Test -  ECHO - 08/15/22 Cardiac Cath - 04/25/20 Pacemaker/ICD device last checked: Pacemaker orders received: Device Rep notified:  Spinal Cord Stimulator:N/A  Sleep Study - Yes CPAP - Yes  Fasting Blood Sugar - 100-200's Checks Blood Sugar _____ times a day Date and result of last Hgb A1c-8.6: 03/15/24  Last dose of GLP1 agonist- mounjaro GLP1 instructions: To hold after4/28/25  Last dose of SGLT-2 inhibitors- N/A SGLT-2 instructions:   Blood Thinner Instructions: Aspirin  Instructions: Last Dose:  Activity level: Can go up a flight of stairs and activities of daily living without stopping and without chest pain and/or shortness of breath      Unable to go up a flight of stairs without shortness of breath     Anesthesia review: Hx: ARF,CAD,DIA,HTN  Patient denies shortness of breath, fever, cough and chest pain at PAT appointment   Patient verbalized understanding of instructions that were given to them at the PAT appointment. Patient was also instructed that they will need to review over the PAT instructions again at home before surgery.

## 2024-04-13 NOTE — Telephone Encounter (Signed)
 Called pt to confirm appt. Pt will be present.

## 2024-04-13 NOTE — Progress Notes (Signed)
 Critical lab. Glucose blood: 513. CBG: 491. Creatinine: 1.73.

## 2024-04-13 NOTE — Progress Notes (Signed)
 Patient's glucose came back at 513. Her kidney function came back elevated at 1.73 up from 1.07. Anion gap is 17. Patient states she didn't take her insulin  this morning. She reports she has been urinating frequently. She was advised to take her insulin  ASAP and to drink lots of fluids today. She has a f/u visit with her PCP tomorrow.

## 2024-04-14 ENCOUNTER — Encounter (INDEPENDENT_AMBULATORY_CARE_PROVIDER_SITE_OTHER): Payer: Self-pay

## 2024-04-14 ENCOUNTER — Ambulatory Visit (INDEPENDENT_AMBULATORY_CARE_PROVIDER_SITE_OTHER): Admitting: Primary Care

## 2024-04-17 NOTE — Anesthesia Preprocedure Evaluation (Addendum)
 Anesthesia Evaluation  Patient identified by MRN, date of birth, ID band Patient awake    Reviewed: Allergy & Precautions, H&P , NPO status , Patient's Chart, lab work & pertinent test results  Airway Mallampati: II  TM Distance: >3 FB Neck ROM: Full    Dental no notable dental hx.    Pulmonary asthma , sleep apnea , former smoker   Pulmonary exam normal breath sounds clear to auscultation       Cardiovascular hypertension, + CAD, + Past MI and + Cardiac Stents  Normal cardiovascular exam Rhythm:Regular Rate:Normal  07/2022: IMPRESSIONS     1. Technically difficult study, poor echo windows. Definity  contrast  given   2. Left ventricular ejection fraction, by estimation, is 65 to 70%. Left  ventricular ejection fraction by PLAX is 65 %. The left ventricle has  normal function. The left ventricle has no regional wall motion  abnormalities. There is mild left ventricular  hypertrophy. Left ventricular diastolic parameters are indeterminate.   3. Right ventricular systolic function is normal. The right ventricular  size is normal.   4. The mitral valve is abnormal. Trivial mitral valve regurgitation.   5. The aortic valve was not well visualized. Aortic valve regurgitation  is not visualized.   6. The inferior vena cava is normal in size with greater than 50%  respiratory variability, suggesting right atrial pressure of 3 mmHg.   Comparison(s): No significant change from prior study. 07/12/2021: LVEF  65-70%, mild LVH.     Neuro/Psych  PSYCHIATRIC DISORDERS Anxiety Depression    negative neurological ROS     GI/Hepatic negative GI ROS, Neg liver ROS,,,  Endo/Other  diabetes    Renal/GU Renal disease  negative genitourinary   Musculoskeletal  (+) Arthritis ,    Abdominal   Peds negative pediatric ROS (+)  Hematology  (+) Blood dyscrasia, anemia   Anesthesia Other Findings   Reproductive/Obstetrics negative OB  ROS                             Anesthesia Physical Anesthesia Plan  ASA: 3  Anesthesia Plan: MAC and Regional   Post-op Pain Management: Regional block* and Tylenol  PO (pre-op)*   Induction: Intravenous  PONV Risk Score and Plan: 2 and Propofol  infusion and Treatment may vary due to age or medical condition  Airway Management Planned: Natural Airway  Additional Equipment:   Intra-op Plan:   Post-operative Plan:   Informed Consent: I have reviewed the patients History and Physical, chart, labs and discussed the procedure including the risks, benefits and alternatives for the proposed anesthesia with the patient or authorized representative who has indicated his/her understanding and acceptance.     Dental advisory given  Plan Discussed with: CRNA  Anesthesia Plan Comments: (PMHx of obesity (BMI 57), uncontrolled IDDM (A1c 9.8). Patient presented to PAT with high CBG (~500s) with mildly elevated anion gap and AKI. Called patient and she states she didn't take her insulin  this AM. Advised to take ASAP and push fluids. She reported she was seeing her PCP the next day but looks like appt was rescheduled till after surgery. Will need DOS eval.  IMPRESSIONS     1. Technically difficult study, poor echo windows. Definity  contrast  given   2. Left ventricular ejection fraction, by estimation, is 65 to 70%. Left  ventricular ejection fraction by PLAX is 65 %. The left ventricle has  normal function. The left ventricle has no regional  wall motion  abnormalities. There is mild left ventricular  hypertrophy. Left ventricular diastolic parameters are indeterminate.   3. Right ventricular systolic function is normal. The right ventricular  size is normal.   4. The mitral valve is abnormal. Trivial mitral valve regurgitation.   5. The aortic valve was not well visualized. Aortic valve regurgitation  is not visualized.   6. The inferior vena cava is normal in  size with greater than 50%  respiratory variability, suggesting right atrial pressure of 3 mmHg.   Comparison(s): No significant change from prior study. 07/12/2021: LVEF  65-70%, mild LVH.  )        Anesthesia Quick Evaluation

## 2024-04-21 DIAGNOSIS — G4733 Obstructive sleep apnea (adult) (pediatric): Secondary | ICD-10-CM | POA: Diagnosis not present

## 2024-04-24 ENCOUNTER — Encounter (HOSPITAL_COMMUNITY): Payer: Self-pay

## 2024-04-24 NOTE — Progress Notes (Signed)
 Case: [id number] Date/Time: 04/25/24 0830   Procedure: RELEASE, CARPAL TUNNEL, ENDOSCOPIC (Right)   Anesthesia type: Choice   Pre-op diagnosis: CARPAL TUNNEL SYNDROME RIGHT WRIST   Location: [facility] ROOM 08 / [facility]   Surgeons: [name], MD       DISCUSSION: Lindsay [name] is a 43 yo female who presents to PAT prior to surgery above. PMH of former smoking, HTN, hx of NSTEMI and CAD s/p PCI in 2017, OSA (uses CPAP), uncontrolled IDDM, obesity, anxiety, depression.  Patient follows with Cardiology for hx of NSTEMI and CAD s/p PCI in 2017. Last seen in clinic on 03/09/2023. Patient doing well at that time. Advised f/u in 1 year. Cardiac clearance tele visit done on 01/11/24 and pt cleared for surgery: "Preoperative Cardiovascular Risk Assessment: According to the Revised Cardiac Risk Index (RCRI), her Perioperative Risk of Major Cardiac Event is (%): 6.6. Her Functional Capacity in METs is: 9.89 according to the Duke Activity Status Index (DASI). Therefore, based on ACC/AHA guidelines, patient would be at acceptable risk for the planned procedure without further cardiovascular testing."  Patient also is a long standing uncontrolled insulin  dependent diabetic. She is followed by Endocrinology at [facility] - last seen on 03/15/24. A1c was 10 one year ago. 9.8 on PAT labs. BMP also notable for glucose of 513. Her kidney function came back elevated at 1.73 up from 1.07. Anion gap is 17. Patient states she didn't take her insulin  this morning and did not fast. She reports she has been urinating frequently. She was advised to take her insulin  ASAP and to drink lots of fluids today (4/24). She has a f/u visit with her PCP tomorrow (4/25)  Update: Pt no showed for PCP appt. Patient will need eval DOS  LD Mounjaro: 4/28  VS: BP (!) 119/55   Pulse (!) 110   Temp 36.9 C (Oral)   Ht 5' 7.5" (1.715 m)   Wt (!) 168.7 kg   LMP 02/29/2024   SpO2 98%   BMI 57.40 kg/m   PROVIDERS: Marius Siemens,  NP   LABS: Labs reviewed: Repeat  (all labs ordered are listed, but only abnormal results are displayed)  Labs Reviewed  HEMOGLOBIN A1C - Abnormal; Notable for the following components:      Result Value   Hgb A1c MFr Bld 9.8 (*)    All other components within normal limits  CBC - Abnormal; Notable for the following components:   WBC 12.6 (*)    MCH 25.3 (*)    All other components within normal limits  BASIC METABOLIC PANEL WITH GFR - Abnormal; Notable for the following components:   Sodium 133 (*)    Chloride 94 (*)    Glucose, Bld 513 (*)    BUN 32 (*)    Creatinine, Ser 1.73 (*)    Calcium  8.3 (*)    GFR, Estimated 37 (*)    Anion gap 17 (*)    All other components within normal limits  GLUCOSE, CAPILLARY - Abnormal; Notable for the following components:   Glucose-Capillary 491 (*)    All other components within normal limits     IMAGES:   EKG:  Sinus tachycardia, rate 111 Minimal voltage criteria for LVH, may be normal variant ( R in aVL ) Cannot rule out Anterior infarct , age undetermined  CV:  Echo 08/15/2022:  IMPRESSIONS    1. Technically difficult study, poor echo windows. Definity  contrast given  2. Left ventricular ejection fraction, by estimation, is  65 to 70%. Left ventricular ejection fraction by PLAX is 65 %. The left ventricle has normal function. The left ventricle has no regional wall motion abnormalities. There is mild left ventricular hypertrophy. Left ventricular diastolic parameters are indeterminate.  3. Right ventricular systolic function is normal. The right ventricular size is normal.  4. The mitral valve is abnormal. Trivial mitral valve regurgitation.  5. The aortic valve was not well visualized. Aortic valve regurgitation is not visualized.  6. The inferior vena cava is normal in size with greater than 50% respiratory variability, suggesting right atrial pressure of 3 mmHg.  Comparison(s): No significant change from prior  study. 07/12/2021: LVEF 65-70%, mild LVH.  LHC 04/25/2020: LAD stent is widely patent with perhaps eccentric 20% narrowing in some views.  The jailed second diagonal is widely patent.  Proximal first diagonal contains ostial to proximal segmental 60% narrowing Normal left main Moderate irregularities in the circumflex up to 40% in the first marginal. Dominant right coronary, luminal irregularities. Normal LV function with EF 60%.  Normal LVEDP.   RECOMMENDATIONS:   Continue aggressive risk factor modification including greater than 150 minutes of moderate activity 5 out of 7 days of the week, weight loss, LDL less than 70, blood pressure control, and management of potential sleep apnea. Chest pain and dyspnea do not appear to be related to obstructive disease or heart failure respectively.  Other explanations should be sought.  Past Medical History:  Diagnosis Date   Anemia    Anxiety    ARF (acute renal failure) (HCC) 04/2015   Arthritis    Asthma    Cellulitis of right upper extremity    Complication of anesthesia    Heart rate drop during epidural,Respiration,and BP   Coronary artery disease    Depression    Diabetes mellitus    insulin  dependent   Hyperlipidemia LDL goal <70    Hypertension    Obesity    S/P angioplasty with stent 08/2016   DES to mLAD and PTCA only to 2nd diag ostium.    Sleep apnea     Past Surgical History:  Procedure Laterality Date   CARDIAC CATHETERIZATION N/A 09/07/2016   Procedure: Left Heart Cath and Coronary Angiography;  Surgeon: Arty Binning, MD;  Location: [facility] INVASIVE CV LAB;  Service: Cardiovascular;  Laterality: N/A;   CARDIAC CATHETERIZATION N/A 09/07/2016   Procedure: Coronary Stent Intervention;  Surgeon: Arty Binning, MD;  Location: [facility] INVASIVE CV LAB;  Service: Cardiovascular;  Laterality: N/A;   CARDIAC CATHETERIZATION N/A 09/07/2016   Procedure: Coronary Balloon Angioplasty;  Surgeon: Arty Binning, MD;  Location: [facility] INVASIVE CV  LAB;  Service: Cardiovascular;  Laterality: N/A;   CATARACT EXTRACTION, BILATERAL     CESAREAN SECTION     x 1   IRRIGATION AND DEBRIDEMENT SHOULDER Right 04/30/2015   Procedure: IRRIGATION AND DEBRIDEMENT SHOULDER;  Surgeon: [name], MD;  Location: [facility] OR;  Service: Orthopedics;  Laterality: Right;   IRRIGATION AND DEBRIDEMENT SHOULDER Right 05/01/2015   LEFT HEART CATH AND CORONARY ANGIOGRAPHY N/A 04/25/2020   Procedure: LEFT HEART CATH AND CORONARY ANGIOGRAPHY;  Surgeon: Arty Binning, MD;  Location: [facility] INVASIVE CV LAB;  Service: Cardiovascular;  Laterality: N/A;   LEG SURGERY Right    remobal ob abcess   SHOULDER ARTHROSCOPY Right 04/30/2015   Procedure: ARTHROSCOPY SHOULDER;  Surgeon: [name], MD;  Location: [facility] OR;  Service: Orthopedics;  Laterality: Right;   TONSILLECTOMY      MEDICATIONS:  acetaminophen  (TYLENOL ) 325 MG tablet   albuterol  (VENTOLIN  HFA) 108 (90 Base) MCG/ACT inhaler   allopurinol (ZYLOPRIM) 100 MG tablet   aspirin  81 MG tablet   atorvastatin  (LIPITOR ) 40 MG tablet   colchicine 0.6 MG tablet   dextromethorphan -guaiFENesin  (TUSSIN DM) 10-100 MG/5ML liquid   diclofenac  Sodium (VOLTAREN ) 1 % GEL   DULoxetine  (CYMBALTA ) 30 MG capsule   FLUoxetine (PROZAC) 20 MG capsule   gabapentin  (NEURONTIN ) 300 MG capsule   HUMULIN  R U-500 KWIKPEN 500 UNIT/ML KwikPen   hydrALAZINE  (APRESOLINE ) 100 MG tablet   isosorbide  mononitrate (IMDUR ) 60 MG 24 hr tablet   losartan  (COZAAR ) 100 MG tablet   Melatonin 10 MG TABS   MOUNJARO 5 MG/0.5ML Pen   Multiple Vitamins-Minerals (MULTIVITAMIN PO)   naloxone (NARCAN) nasal spray 4 mg/0.1 mL   nitroGLYCERIN  (NITROSTAT ) 0.4 MG SL tablet   oxyCODONE -acetaminophen  (PERCOCET) 10-325 MG tablet   potassium chloride  SA (KLOR-CON ) 20 MEQ tablet   sodium chloride  (OCEAN) 0.65 % nasal spray   tiZANidine  (ZANAFLEX ) 4 MG tablet   torsemide  (DEMADEX ) 20 MG tablet   Vitamin D , Ergocalciferol , (DRISDOL ) 1.25 MG (50000 UNIT) CAPS  capsule   No current facility-administered medications for this encounter.   Antoinette Kirschner [facility] Surgical Short Stay/Anesthesiology [facility] Phone [phone] 04/24/2024 10:23 AM

## 2024-04-24 NOTE — H&P (Signed)
 PREOPERATIVE H&P  Chief Complaint: CARPAL TUNNEL SYNDROME RIGHT WRIST  HPI: Lindsay [name] is a 43 y.o. female who is scheduled for, Procedure(s): RELEASE, CARPAL TUNNEL, ENDOSCOPIC.   Patient has had right hand numbness for quite some time. She has tried and failed bracing and anti-inflammatories.  Symptoms are rated as moderate to severe, and have been worsening.  This is significantly impairing activities of daily living.    Please see clinic note for further details on this patient's care.    She has elected for surgical management.   Past Medical History:  Diagnosis Date   Anemia    Anxiety    ARF (acute renal failure) (HCC) 04/2015   Arthritis    Asthma    Cellulitis of right upper extremity    Complication of anesthesia    Heart rate drop during epidural,Respiration,and BP   Coronary artery disease    Depression    Diabetes mellitus    insulin  dependent   Hyperlipidemia LDL goal <70    Hypertension    Obesity    S/P angioplasty with stent 08/2016   DES to mLAD and PTCA only to 2nd diag ostium.    Sleep apnea    Past Surgical History:  Procedure Laterality Date   CARDIAC CATHETERIZATION N/A 09/07/2016   Procedure: Left Heart Cath and Coronary Angiography;  Surgeon: Arty Binning, MD;  Location: [facility] INVASIVE CV LAB;  Service: Cardiovascular;  Laterality: N/A;   CARDIAC CATHETERIZATION N/A 09/07/2016   Procedure: Coronary Stent Intervention;  Surgeon: Arty Binning, MD;  Location: [facility] INVASIVE CV LAB;  Service: Cardiovascular;  Laterality: N/A;   CARDIAC CATHETERIZATION N/A 09/07/2016   Procedure: Coronary Balloon Angioplasty;  Surgeon: Arty Binning, MD;  Location: [facility] INVASIVE CV LAB;  Service: Cardiovascular;  Laterality: N/A;   CATARACT EXTRACTION, BILATERAL     CESAREAN SECTION     x 1   IRRIGATION AND DEBRIDEMENT SHOULDER Right 04/30/2015   Procedure: IRRIGATION AND DEBRIDEMENT SHOULDER;  Surgeon: [name], MD;  Location: [facility] OR;  Service:  Orthopedics;  Laterality: Right;   IRRIGATION AND DEBRIDEMENT SHOULDER Right 05/01/2015   LEFT HEART CATH AND CORONARY ANGIOGRAPHY N/A 04/25/2020   Procedure: LEFT HEART CATH AND CORONARY ANGIOGRAPHY;  Surgeon: Arty Binning, MD;  Location: [facility] INVASIVE CV LAB;  Service: Cardiovascular;  Laterality: N/A;   LEG SURGERY Right    remobal ob abcess   SHOULDER ARTHROSCOPY Right 04/30/2015   Procedure: ARTHROSCOPY SHOULDER;  Surgeon: [name], MD;  Location: [facility] OR;  Service: Orthopedics;  Laterality: Right;   TONSILLECTOMY     Social History   Socioeconomic History   Marital status: Single    Spouse name: Not on file   Number of children: Not on file   Years of education: Not on file   Highest education level: Not on file  Occupational History   Occupation: disabled  Tobacco Use   Smoking status: Former    Current packs/day: 0.00    Average packs/day: 0.3 packs/day for 2.0 years (0.5 ttl pk-yrs)    Types: Cigarettes    Start date: 2006    Quit date: 2008    Years since quitting: 17.3   Smokeless tobacco: Never  Vaping Use   Vaping status: Never Used  Substance and Sexual Activity   Alcohol  use: Yes    Comment: socially   Drug use: No   Sexual activity: Not on file  Other Topics Concern   Not on file  Social History Narrative   Not on file   Social Drivers of Health   Financial Resource Strain: Medium Risk (03/29/2024)   Received from [facility]   Overall Financial Resource Strain (CARDIA)    Difficulty of Paying Living Expenses: Somewhat hard  Food Insecurity: Food Insecurity Present (03/29/2024)   Received from [facility]   Hunger Vital Sign    Worried About Running Out of Food in the Last Year: Sometimes true    Ran Out of Food in the Last Year: Sometimes true  Transportation Needs: No Transportation Needs (03/29/2024)   Received from [facility]   PRAPARE - Transportation    Lack of Transportation (Medical): No    Lack of Transportation (Non-Medical): No   Physical Activity: Insufficiently Active (03/29/2024)   Received from [facility]   Exercise Vital Sign    Days of Exercise per Week: 3 days    Minutes of Exercise per Session: 20 min  Stress: Stress Concern Present (03/29/2024)   Received from [facility] - Occupational Stress Questionnaire    Feeling of Stress : To some extent  Social Connections: Moderately Integrated (03/29/2024)   Received from [facility]   Social Network    How would you rate your social network (family, work, friends)?: Adequate participation with social networks   Family History  Problem Relation Age of Onset   Diabetes Mother    Hypertension Mother    Heart disease Mother    Diabetes Father    Heart disease Father    Stroke Maternal Grandmother    Cancer Maternal Grandmother    Breast cancer Cousin        30's   Breast cancer Cousin        50's   Allergies  Allergen Reactions   Acetaminophen  Itching and Swelling    Itching of the mouth, swelling of tongue and stomach started hurting mild   Hydrazine Yellow [Fd&C Yellow #5 (Tartrazine)] Shortness Of Breath and Swelling    Swelling mostly noticed in legs and feet, retaining urination, shortness of breaht, and minor chest pain   Lisinopril  Shortness Of Breath    Was on prinzide ; had sob/chest pain on it.   Pineapple Itching   Prior to Admission medications   Medication Sig Start Date End Date Taking? Authorizing Provider  acetaminophen  (TYLENOL ) 325 MG tablet Take 2 tablets (650 mg total) by mouth every 6 (six) hours as needed for mild pain, fever or headache. 08/23/22  Yes Claretta Croft, MD  albuterol  (VENTOLIN  HFA) [phone] Base) MCG/ACT inhaler Inhale 2 puffs into the lungs every 6 (six) hours as needed for wheezing or shortness of breath. 03/15/24  Yes Marius Siemens, NP  allopurinol (ZYLOPRIM) 100 MG tablet Take 100 mg by mouth daily.   Yes [provider]  aspirin  81 MG tablet Take 1  tablet (81 mg total) by mouth daily. 01/13/18  Yes Jamas Maywood, PA-C  atorvastatin  (LIPITOR ) 40 MG tablet Take 1 tablet (40 mg total) by mouth daily. 03/17/24  Yes Marius Siemens, NP  colchicine 0.6 MG tablet Take 0.6 mg by mouth daily as needed (Gout). 03/20/24  Yes [provider]  dextromethorphan -guaiFENesin  (TUSSIN DM) 10-100 MG/5ML liquid Take 10 mLs by mouth every 4 (four) hours as needed for cough. 09/28/23  Yes Sueellen Emery, MD  diclofenac  Sodium (VOLTAREN ) 1 % GEL Apply 4 g topically 4 (four) times daily. Patient taking differently: Apply 4 g topically 4 (  four) times daily as needed (Pain). 12/03/21  Yes Marius Siemens, NP  DULoxetine  (CYMBALTA ) 30 MG capsule Take 1 capsule (30 mg total) by mouth 2 (two) times daily. 03/15/24  Yes Marius Siemens, NP  FLUoxetine (PROZAC) 20 MG capsule Take 20 mg by mouth daily.   Yes [provider]  gabapentin  (NEURONTIN ) 300 MG capsule TAKE 2 CAPSULES(600 MG) BY MOUTH THREE TIMES DAILY 01/05/24  Yes Marius Siemens, NP  HUMULIN  R U-500 KWIKPEN 500 UNIT/ML KwikPen Inject 75-80 Units into the skin 3 (three) times daily with meals. AM 80 units Noon 75 units Evening 75 units If BG above 200 add 5 units to prescribed dose Patient taking differently: Inject 80 Units into the skin 3 (three) times daily with meals. AM 80 units Noon 80 units Evening 80 units If BG above 200 add 5 units to prescribed dose 10/23/22  Yes Edwards, Meade Spencer, NP  hydrALAZINE  (APRESOLINE ) 100 MG tablet Take 1 tablet (100 mg total) by mouth 3 (three) times daily. 04/06/24  Yes Marius Siemens, NP  isosorbide  mononitrate (IMDUR ) 60 MG 24 hr tablet Take 1 tablet (60 mg total) by mouth daily. 03/09/23  Yes Chandrasekhar, Mahesh A, MD  losartan  (COZAAR ) 100 MG tablet Take 1 tablet (100 mg total) by mouth daily. 03/09/23  Yes Chandrasekhar, Mahesh A, MD  Melatonin 10 MG TABS Take 3 tablets by mouth at bedtime as needed (sleep). 09/25/22  Yes Edwards,  Meade Spencer, NP  MOUNJARO 5 MG/0.5ML Pen Inject 5 mg into the skin once a week. 12/20/23  Yes [provider]  Multiple Vitamins-Minerals (MULTIVITAMIN PO) Take 1 tablet by mouth daily.   Yes [provider]  naloxone (NARCAN) nasal spray 4 mg/0.1 mL Place into both nostrils as needed (over dose). 10/06/22  Yes [provider]  nitroGLYCERIN  (NITROSTAT ) 0.4 MG SL tablet Place 1 tablet (0.4 mg total) under the tongue every 5 (five) minutes as needed for chest pain. Please keep upcoming appt in October 2022. Final Attempt 06/19/21  Yes Arty Binning, MD  oxyCODONE -acetaminophen  (PERCOCET) 10-325 MG tablet Take 1 tablet by mouth 3 (three) times daily. 01/16/23  Yes [provider]  potassium chloride  SA (KLOR-CON ) 20 MEQ tablet Take 1 tablet (20 mEq total) by mouth daily. 08/15/21  Yes Arty Binning, MD  sodium chloride  (OCEAN) 0.65 % nasal spray Place 1 spray into the nose as needed for congestion. 02/19/17  Yes [provider]  tiZANidine  (ZANAFLEX ) 4 MG tablet Take 1 tablet (4 mg total) by mouth every 6 (six) hours as needed for muscle spasms. 03/15/24  Yes Marius Siemens, NP  torsemide  (DEMADEX ) 20 MG tablet Take 2 tablets (40 mg total) by mouth 2 (two) times daily. 03/15/24  Yes Marius Siemens, NP  Vitamin D , Ergocalciferol , (DRISDOL ) 1.25 MG (50000 UNIT) CAPS capsule Take 50,000 Units by mouth once a week.   Yes [provider]    ROS: All other systems have been reviewed and were otherwise negative with the exception of those mentioned in the HPI and as above.  Physical Exam: General: Alert, no acute distress Cardiovascular: No pedal edema Respiratory: No cyanosis, no use of accessory musculature GI: No organomegaly, abdomen is soft and non-tender Skin: No lesions in the area of chief complaint Neurologic: Sensation intact distally Psychiatric: Patient is competent for consent with normal mood and affect Lymphatic: No axillary or  cervical lymphadenopathy  MUSCULOSKELETAL:  Right hand: positive phalen's. Decreased sensation at the median nerve.  Imaging: N/A  Assessment: CARPAL TUNNEL SYNDROME RIGHT WRIST  Plan: Plan for Procedure(s): RELEASE, CARPAL TUNNEL, ENDOSCOPIC   The risks benefits and alternatives were discussed with the patient including but not limited to the risks of nonoperative treatment, versus surgical intervention including infection, bleeding, nerve injury,  blood clots, cardiopulmonary complications, morbidity, mortality, among others, and they were willing to proceed.   The patient acknowledged the explanation, agreed to proceed with the plan and consent was signed.    Adine Ahmadi, PA-C  04/24/2024 12:39 PM

## 2024-04-24 NOTE — Progress Notes (Addendum)
 Patient was a no show for her PCP visit on 4/25.  Patient had a blood sugar of 513 at PST visit.  Discussed with Dr Lasalle Pointer who wanted patient to see if she could bee seen by her PCP today before surgery tomorrow. Contacted patient to make her aware.  Patient returned call back to me who stated that her PCP office was not able to work her in today for a visit.  I spoke with Dr Lasalle Pointer to let him know that she was not able to get an appointment with her PCP today.  I explained that the patient stated that when she came in for her PST visit she had not taken her insulin  yet that day and she ate breakfast before coming in.  Dr Lasalle Pointer stated that we can bring her in for surgery and assess her blood sugar upon arrival .    Patient is aware to arrive for her surgery tomorrow at her planned time.  Instructed patient to follow her PST instructions for her insulin  and to avoid sugary foods and carbohydrates today if possible. Patient verbalized understanding.  Gave patient return callback number in case she had questions.

## 2024-04-25 ENCOUNTER — Encounter (HOSPITAL_COMMUNITY)
Admission: RE | Disposition: A | Discharge status: Home / Self Care | Payer: Self-pay | Source: Home / Self Care | Attending: Orthopedic Surgery

## 2024-04-25 ENCOUNTER — Encounter (HOSPITAL_COMMUNITY): Payer: Self-pay | Admitting: Orthopedic Surgery

## 2024-04-25 ENCOUNTER — Other Ambulatory Visit: Payer: Self-pay

## 2024-04-25 ENCOUNTER — Ambulatory Visit (HOSPITAL_COMMUNITY): Payer: Self-pay | Admitting: Physician Assistant

## 2024-04-25 ENCOUNTER — Ambulatory Visit (HOSPITAL_BASED_OUTPATIENT_CLINIC_OR_DEPARTMENT_OTHER): Payer: Self-pay | Admitting: Physician Assistant

## 2024-04-25 ENCOUNTER — Ambulatory Visit (HOSPITAL_COMMUNITY)
Admission: RE | Admit: 2024-04-25 | Discharge: 2024-04-25 | Disposition: A | Discharge status: Home / Self Care | Attending: Orthopedic Surgery | Admitting: Orthopedic Surgery

## 2024-04-25 DIAGNOSIS — Z87891 Personal history of nicotine dependence: Secondary | ICD-10-CM | POA: Insufficient documentation

## 2024-04-25 DIAGNOSIS — I252 Old myocardial infarction: Secondary | ICD-10-CM | POA: Diagnosis not present

## 2024-04-25 DIAGNOSIS — I10 Essential (primary) hypertension: Secondary | ICD-10-CM | POA: Diagnosis not present

## 2024-04-25 DIAGNOSIS — Z5986 Financial insecurity: Secondary | ICD-10-CM | POA: Diagnosis not present

## 2024-04-25 DIAGNOSIS — G4730 Sleep apnea, unspecified: Secondary | ICD-10-CM | POA: Diagnosis not present

## 2024-04-25 DIAGNOSIS — I2510 Atherosclerotic heart disease of native coronary artery without angina pectoris: Secondary | ICD-10-CM | POA: Insufficient documentation

## 2024-04-25 DIAGNOSIS — Z5941 Food insecurity: Secondary | ICD-10-CM | POA: Diagnosis not present

## 2024-04-25 DIAGNOSIS — Z955 Presence of coronary angioplasty implant and graft: Secondary | ICD-10-CM | POA: Diagnosis not present

## 2024-04-25 DIAGNOSIS — G5601 Carpal tunnel syndrome, right upper limb: Secondary | ICD-10-CM | POA: Insufficient documentation

## 2024-04-25 DIAGNOSIS — J45909 Unspecified asthma, uncomplicated: Secondary | ICD-10-CM

## 2024-04-25 DIAGNOSIS — E1141 Type 2 diabetes mellitus with diabetic mononeuropathy: Secondary | ICD-10-CM | POA: Diagnosis not present

## 2024-04-25 DIAGNOSIS — M1990 Unspecified osteoarthritis, unspecified site: Secondary | ICD-10-CM | POA: Insufficient documentation

## 2024-04-25 DIAGNOSIS — I110 Hypertensive heart disease with heart failure: Secondary | ICD-10-CM | POA: Diagnosis not present

## 2024-04-25 DIAGNOSIS — Z01818 Encounter for other preprocedural examination: Secondary | ICD-10-CM

## 2024-04-25 LAB — POCT PREGNANCY, URINE: Preg Test, Ur: NEGATIVE

## 2024-04-25 LAB — GLUCOSE, CAPILLARY: Glucose-Capillary: 86 mg/dL (ref 70–99)

## 2024-04-25 SURGERY — RELEASE, CARPAL TUNNEL, ENDOSCOPIC
Anesthesia: Monitor Anesthesia Care | Laterality: Right

## 2024-04-25 MED ORDER — PHENYLEPHRINE 80 MCG/ML (10ML) SYRINGE FOR IV PUSH (FOR BLOOD PRESSURE SUPPORT)
PREFILLED_SYRINGE | INTRAVENOUS | Status: AC
  Filled 2024-04-25: qty 10

## 2024-04-25 MED ORDER — GLYCOPYRROLATE 0.2 MG/ML IJ SOLN
INTRAMUSCULAR | Status: DC | PRN
  Administered 2024-04-25: .2 mg via INTRAVENOUS

## 2024-04-25 MED ORDER — OXYCODONE HCL 5 MG PO TABS: 5.0000 mg | ORAL_TABLET | Freq: Once | ORAL | Status: DC | PRN

## 2024-04-25 MED ORDER — ONDANSETRON HCL 4 MG/2ML IJ SOLN
INTRAMUSCULAR | Status: DC | PRN
  Administered 2024-04-25: 4 mg via INTRAVENOUS

## 2024-04-25 MED ORDER — TRAMADOL HCL 50 MG PO TABS: 50.0000 mg | ORAL_TABLET | Freq: Two times a day (BID) | ORAL | 0 refills | Status: DC | PRN

## 2024-04-25 MED ORDER — CHLORHEXIDINE GLUCONATE 0.12 % MT SOLN
15.0000 mL | Freq: Once | OROMUCOSAL | Status: AC
  Administered 2024-04-25: 15 mL via OROMUCOSAL

## 2024-04-25 MED ORDER — LACTATED RINGERS IV SOLN: INTRAVENOUS | Status: DC

## 2024-04-25 MED ORDER — MIDAZOLAM HCL 2 MG/2ML IJ SOLN
1.0000 mg | INTRAMUSCULAR | Status: DC
  Administered 2024-04-25: 2 mg via INTRAVENOUS
  Filled 2024-04-25: qty 2

## 2024-04-25 MED ORDER — MIDAZOLAM HCL 2 MG/2ML IJ SOLN
INTRAMUSCULAR | Status: DC | PRN
Start: 2024-04-25 — End: 2024-04-25
  Administered 2024-04-25: 2 mg via INTRAVENOUS

## 2024-04-25 MED ORDER — LIDOCAINE HCL 1 % IJ SOLN
INTRAMUSCULAR | Status: AC
  Filled 2024-04-25: qty 20

## 2024-04-25 MED ORDER — LIDOCAINE HCL (PF) 2 % IJ SOLN
INTRAMUSCULAR | Status: AC
  Filled 2024-04-25: qty 5

## 2024-04-25 MED ORDER — PROPOFOL 500 MG/50ML IV EMUL
INTRAVENOUS | Status: DC | PRN
  Administered 2024-04-25: 10 ug/kg/min via INTRAVENOUS

## 2024-04-25 MED ORDER — PROPOFOL 1000 MG/100ML IV EMUL
INTRAVENOUS | Status: AC
  Filled 2024-04-25: qty 100

## 2024-04-25 MED ORDER — ROPIVACAINE HCL 5 MG/ML IJ SOLN
INTRAMUSCULAR | Status: DC | PRN
  Administered 2024-04-25: 25 mL via PERINEURAL

## 2024-04-25 MED ORDER — 0.9 % SODIUM CHLORIDE (POUR BTL) OPTIME
TOPICAL | Status: DC | PRN
  Administered 2024-04-25: 1000 mL

## 2024-04-25 MED ORDER — DROPERIDOL 2.5 MG/ML IJ SOLN: 0.6250 mg | Freq: Once | INTRAMUSCULAR | Status: DC | PRN

## 2024-04-25 MED ORDER — ACETAMINOPHEN 500 MG PO TABS: 1000.0000 mg | ORAL_TABLET | Freq: Three times a day (TID) | ORAL | 0 refills | Status: AC

## 2024-04-25 MED ORDER — MIDAZOLAM HCL 2 MG/2ML IJ SOLN
INTRAMUSCULAR | Status: AC
  Filled 2024-04-25: qty 2

## 2024-04-25 MED ORDER — OXYCODONE HCL 5 MG/5ML PO SOLN: 5.0000 mg | Freq: Once | ORAL | Status: DC | PRN

## 2024-04-25 MED ORDER — LIDOCAINE HCL 1 % IJ SOLN: INTRAMUSCULAR | Status: DC | PRN

## 2024-04-25 MED ORDER — FENTANYL CITRATE PF 50 MCG/ML IJ SOSY: 25.0000 ug | PREFILLED_SYRINGE | INTRAMUSCULAR | Status: DC | PRN

## 2024-04-25 MED ORDER — KETAMINE HCL 10 MG/ML IJ SOLN
INTRAMUSCULAR | Status: DC | PRN
  Administered 2024-04-25 (×2): 10 mg via INTRAVENOUS

## 2024-04-25 MED ORDER — FENTANYL CITRATE PF 50 MCG/ML IJ SOSY
50.0000 ug | PREFILLED_SYRINGE | INTRAMUSCULAR | Status: DC
  Administered 2024-04-25: 50 ug via INTRAVENOUS
  Filled 2024-04-25: qty 2

## 2024-04-25 MED ORDER — ONDANSETRON HCL 4 MG PO TABS: 4.0000 mg | ORAL_TABLET | Freq: Three times a day (TID) | ORAL | 0 refills | Status: AC | PRN

## 2024-04-25 MED ORDER — LACTATED RINGERS IV SOLN: INTRAVENOUS | Status: DC | PRN

## 2024-04-25 MED ORDER — CEFAZOLIN SODIUM-DEXTROSE 3-4 GM/150ML-% IV SOLN
3.0000 g | INTRAVENOUS | Status: AC
  Administered 2024-04-25: 3 g via INTRAVENOUS
  Filled 2024-04-25: qty 150

## 2024-04-25 MED ORDER — ORAL CARE MOUTH RINSE: 15.0000 mL | Freq: Once | OROMUCOSAL | Status: AC

## 2024-04-25 MED ORDER — KETAMINE HCL 50 MG/5ML IJ SOSY
PREFILLED_SYRINGE | INTRAMUSCULAR | Status: AC
  Filled 2024-04-25: qty 5

## 2024-04-25 MED ORDER — POVIDONE-IODINE 10 % EX SWAB: 2.0000 | Freq: Once | CUTANEOUS | Status: DC

## 2024-04-25 MED ORDER — GLYCOPYRROLATE 0.2 MG/ML IJ SOLN
INTRAMUSCULAR | Status: AC
  Filled 2024-04-25: qty 1

## 2024-04-25 MED ORDER — BUPIVACAINE HCL (PF) 0.5 % IJ SOLN
INTRAMUSCULAR | Status: AC
  Filled 2024-04-25: qty 30

## 2024-04-25 MED ORDER — DEXAMETHASONE SODIUM PHOSPHATE 10 MG/ML IJ SOLN
INTRAMUSCULAR | Status: DC | PRN
  Administered 2024-04-25: 8 mg via INTRAVENOUS

## 2024-04-25 MED ORDER — INSULIN ASPART 100 UNIT/ML IJ SOLN: 0.0000 [IU] | INTRAMUSCULAR | Status: DC | PRN

## 2024-04-25 SURGICAL SUPPLY — 27 items
BNDG COMPR ESMARK 4X3 LF (GAUZE/BANDAGES/DRESSINGS) IMPLANT
BNDG ELASTIC 3INX 5YD STR LF (GAUZE/BANDAGES/DRESSINGS) ×1 IMPLANT
CHLORAPREP W/TINT 26 (MISCELLANEOUS) ×1 IMPLANT
CORD BIPOLAR FORCEPS 12FT (ELECTRODE) IMPLANT
COVER SURGICAL LIGHT HANDLE (MISCELLANEOUS) ×1 IMPLANT
CUFF TOURN SGL QUICK 18X4 (TOURNIQUET CUFF) ×1 IMPLANT
DRAPE U-SHAPE 47X51 STRL (DRAPES) ×1 IMPLANT
DRSG EMULSION OIL 3X3 NADH (GAUZE/BANDAGES/DRESSINGS) ×1 IMPLANT
DRSG TEGADERM 2-3/8X2-3/4 SM (GAUZE/BANDAGES/DRESSINGS) ×1 IMPLANT
GAUZE SPONGE 4X4 12PLY STRL (GAUZE/BANDAGES/DRESSINGS) ×1 IMPLANT
GLOVE BIO SURGEON STRL SZ7.5 (GLOVE) ×1 IMPLANT
GLOVE BIOGEL PI IND STRL 7.5 (GLOVE) ×1 IMPLANT
GLOVE BIOGEL PI IND STRL 8 (GLOVE) ×1 IMPLANT
GLOVE SURG SYN 7.0 (GLOVE) ×1 IMPLANT
GLOVE SURG SYN 7.0 PF PI (GLOVE) ×1 IMPLANT
GOWN STRL REUS W/ TWL LRG LVL3 (GOWN DISPOSABLE) ×1 IMPLANT
GOWN STRL REUS W/ TWL XL LVL3 (GOWN DISPOSABLE) ×1 IMPLANT
KIT ARTHRO SURG NANOEC TR (ORTHOPEDIC DISPOSABLE SUPPLIES) IMPLANT
KIT BASIN OR (CUSTOM PROCEDURE TRAY) ×1 IMPLANT
NDL HYPO 22X1.5 SAFETY MO (MISCELLANEOUS) ×1 IMPLANT
NEEDLE HYPO 22X1.5 SAFETY MO (MISCELLANEOUS) ×1 IMPLANT
NS IRRIG 1000ML POUR BTL (IV SOLUTION) ×1 IMPLANT
PACK ORTHO EXTREMITY (CUSTOM PROCEDURE TRAY) ×1 IMPLANT
SUT ETHILON 3 0 PS 1 (SUTURE) ×1 IMPLANT
SYR CONTROL 10ML LL (SYRINGE) IMPLANT
TOWEL GREEN STERILE FF (TOWEL DISPOSABLE) ×1 IMPLANT
UNDERPAD 30X36 HEAVY ABSORB (UNDERPADS AND DIAPERS) ×1 IMPLANT

## 2024-04-25 NOTE — Op Note (Signed)
 04/25/2024  9:31 AM  PATIENT:  Lindsay [name]    PRE-OPERATIVE DIAGNOSIS:  CARPAL TUNNEL SYNDROME RIGHT WRIST  POST-OPERATIVE DIAGNOSIS:  Same  PROCEDURE:  RIGHT ENDOSCOPIC CARPAL TUNNEL RELEASE  SURGEON:  [name], MD  PHYSICIAN ASSISTANT: Marzella Solan, PA-C, he was present and scrubbed throughout the case, critical for completion in a timely fashion, and for retraction, instrumentation, and closure.   ANESTHESIA:   General  PREOPERATIVE INDICATIONS:  Lindsay [name] is a  43 y.o. female with a diagnosis of CARPAL TUNNEL SYNDROME RIGHT WRIST who failed conservative measures and elected for surgical management.    The risks benefits and alternatives were discussed with the patient preoperatively including but not limited to the risks of infection, bleeding, nerve injury, incomplete relief of symptoms, pillar pain, cardiopulmonary complications, the need for revision surgery, among others, and the patient was willing to proceed.  OPERATIVE FINDINGS: complete release  OPERATIVE PROCEDURE: Patient was identified in the preoperative holding area and site was marked by me. female was transported to the operating theater and placed on the table in the supine position taking care to pad all bony prominences. After appropriate time out general anesthesia was induced and female received ancef  for preoperative antibiotics. The extremity was prepped and draped in normal sterile fashion.  I made a 1 severe incision just proximal to the dominant volar wrist crease in line with the radial aspect of the small finger. Spread down to the fascia and this was incised in line with the incision. Metzenbaum scissors were then prepped passed above and below the fascia proximally to clear the nerve away from the fascia clear soft tissue plane superficial to it as well. This was then incised.  I then turned attention distally where the fascia edge was as was visible within the wound. I sequentially  dilated the carpal tunnel sweeping soft tissue off of the undersurface of the ligament which was palpable throughout each past. I then inserted the endoscope and under direct visualization was able to see the undersurface the carpal tunnel throughout the entire insertion. Wasn't visualized the distal end of the ligament I put the blade retracted the instrument incising the transverse carpal ligament. I then reinserted the endoscope and directly visualized each cut and as well as intact median nerve without harm.   Wound was then irrigated and closed with a superficial suture. A sterile dressing and splint was applied   She tolerated this well, with no complications.  POSTOPERATIVE PLAN: Keep the splint clean and dry. VTE prophylaxis will consist of ambulation and foot pump exercises and possible chemical px as indicated

## 2024-04-25 NOTE — Anesthesia Procedure Notes (Signed)
 Procedure Name: MAC Date/Time: 04/25/2024 9:02 AM  Performed by: Darlena Ego, CRNAPre-anesthesia Checklist: Patient identified, Emergency Drugs available, Suction available, Patient being monitored and Timeout performed Patient Re-evaluated:Patient Re-evaluated prior to induction Oxygen Delivery Method: Simple face mask Preoxygenation: Pre-oxygenation with 100% oxygen Induction Type: IV induction Placement Confirmation: positive ETCO2

## 2024-04-25 NOTE — Anesthesia Postprocedure Evaluation (Signed)
 Anesthesia Post Note  Patient: Lindsay [name]  Procedure(s) Performed: RIGHT ENDOSCOPIC CARPAL TUNNEL RELEASE (Right)     Patient location during evaluation: PACU Anesthesia Type: Regional and MAC Level of consciousness: awake and alert Pain management: pain level controlled Vital Signs Assessment: post-procedure vital signs reviewed and stable Respiratory status: spontaneous breathing, nonlabored ventilation, respiratory function stable and patient connected to nasal cannula oxygen Cardiovascular status: stable and blood pressure returned to baseline Postop Assessment: no apparent nausea or vomiting Anesthetic complications: no   No notable events documented.  Last Vitals:  Vitals:   04/25/24 0945 04/25/24 1000  BP: (!) 154/95 (!) 150/88  Pulse: 85 83  Resp: (!) 21 14  Temp:    SpO2: 98% 97%    Last Pain:  Vitals:   04/25/24 1000  TempSrc:   PainSc: 0-No pain                 Lethaniel Rave

## 2024-04-25 NOTE — Transfer of Care (Signed)
 Immediate Anesthesia Transfer of Care Note  Patient: Lindsay [name]  Procedure(s) Performed: RIGHT ENDOSCOPIC CARPAL TUNNEL RELEASE (Right)  Patient Location: PACU  Anesthesia Type:MAC and Regional  Level of Consciousness: awake, alert , and oriented  Airway & Oxygen Therapy: Patient Spontanous Breathing and Patient connected to nasal cannula oxygen  Post-op Assessment: Report given to RN  Post vital signs: Reviewed and stable  Last Vitals:  Vitals Value Taken Time  BP 146/90 04/25/24 0941  Temp    Pulse 83 04/25/24 0943  Resp 18 04/25/24 0943  SpO2 100 % 04/25/24 0943  Vitals shown include unfiled device data.  Last Pain:  Vitals:   04/25/24 0845  TempSrc:   PainSc: 0-No pain         Complications: No notable events documented.

## 2024-04-25 NOTE — Interval H&P Note (Signed)
 History and Physical Interval Note:  04/25/2024 6:43 AM  Lindsay [name]  has presented today for surgery, with the diagnosis of CARPAL TUNNEL SYNDROME RIGHT WRIST.  The various methods of treatment have been discussed with the patient and family. After consideration of risks, benefits and other options for treatment, the patient has consented to  Procedure(s): RELEASE, CARPAL TUNNEL, ENDOSCOPIC (Right) as a surgical intervention.  The patient's history has been reviewed, patient examined, no change in status, stable for surgery.  I have reviewed the patient's chart and labs.  Questions were answered to the patient's satisfaction.     [name]

## 2024-04-25 NOTE — Discharge Instructions (Signed)
 Keep wrist elevated with ice as much as possible to reduce pain and swelling.  Diet: As you were doing prior to hospitalization   Shower / Dressing:  Leave dressing in place and dry for 3 days.  You may then remove dressings and shower over incision.  No bath or submerging incision.  Place clean Band-Aid over incision.  Activity:  Increase activity slowly as tolerated, but follow the weight bearing instructions below.  The rules on driving is that you can not be taking narcotics while you drive, and you must feel in control of the vehicle.    Weight Bearing:  Non weight bearing affected wrist.    To prevent constipation: you may use a stool softener such as -  Colace (over the counter) 100 mg by mouth twice a day  Drink plenty of fluids (prune juice may be helpful) and high fiber foods Miralax  (over the counter) for constipation as needed.    Itching:  If you experience itching with your medications, try taking only a single pain pill, or even half a pain pill at a time.  You can also use benadryl  over the counter for itching or also to help with sleep.   Precautions:  If you experience chest pain or shortness of breath - call 911 immediately for transfer to the hospital emergency department!!  If you develop a fever greater that 101 F, purulent drainage from wound, increased redness or drainage from wound, or calf pain -- Call the office at [phone]                                         Follow- Up Appointment:  Please call for an appointment to be seen in 2 weeks [place] - [phone]

## 2024-04-25 NOTE — Anesthesia Procedure Notes (Signed)
 Anesthesia Regional Block: Axillary brachial plexus block   Pre-Anesthetic Checklist: , timeout performed,  Correct Patient, Correct Site, Correct Laterality,  Correct Procedure, Correct Position, site marked,  Risks and benefits discussed,  Surgical consent,  Pre-op evaluation,  At surgeon's request and post-op pain management  Laterality: Right  Prep: chloraprep       Needles:  Injection technique: Single-shot  Needle Type: Echogenic Stimulator Needle     Needle Length: 9cm  Needle Gauge: 21     Additional Needles:   Procedures:,,,, ultrasound used (permanent image in chart),,    Narrative:  Start time: 04/25/2024 8:00 AM End time: 04/25/2024 8:05 AM Injection made incrementally with aspirations every 5 mL.  Performed by: Personally  Anesthesiologist: Lethaniel Rave, MD  Additional Notes: Discussed risks and benefits of the nerve block in detail, including but not limited vascular injury, permanent nerve damage and infection.   Patient tolerated the procedure well. Local anesthetic introduced in an incremental fashion under minimal resistance after negative aspirations. No paresthesias were elicited. After completion of the procedure, no acute issues were identified and patient continued to be monitored by RN.

## 2024-04-26 ENCOUNTER — Encounter (HOSPITAL_COMMUNITY): Payer: Self-pay | Admitting: Orthopedic Surgery

## 2024-04-26 ENCOUNTER — Encounter (HOSPITAL_COMMUNITY): Payer: Self-pay

## 2024-04-27 ENCOUNTER — Other Ambulatory Visit (INDEPENDENT_AMBULATORY_CARE_PROVIDER_SITE_OTHER): Payer: Self-pay | Admitting: Primary Care

## 2024-04-27 DIAGNOSIS — E118 Type 2 diabetes mellitus with unspecified complications: Secondary | ICD-10-CM

## 2024-04-27 DIAGNOSIS — M62838 Other muscle spasm: Secondary | ICD-10-CM

## 2024-04-27 NOTE — Telephone Encounter (Unsigned)
 Copied from CRM [id number]. Topic: Clinical - Medication Refill >> Apr 27, 2024 10:11 AM Lizabeth Riggs wrote: Medication: gabapentin  (NEURONTIN ) 300 MG capsule AND tiZANidine  (ZANAFLEX ) 4 MG tablet  Has the patient contacted their pharmacy? Yes (Agent: If no, request that the patient contact the pharmacy for the refill. If patient does not wish to contact the pharmacy document the reason why and proceed with request.) (Agent: If yes, when and what did the pharmacy advise?) Pharmacy needs order to refill medication   This is the patient's preferred pharmacy:  [facility] [phone] - [place], [place] - [address] AT [facility] [address] [place] [address] Phone: [phone] Fax: [phone]  Is this the correct pharmacy for this prescription? Yes If no, delete pharmacy and type the correct one.   Has the prescription been filled recently? No  Is the patient out of the medication? Yes She is out of both medications   Has the patient been seen for an appointment in the last year OR does the patient have an upcoming appointment? Yes  Can we respond through MyChart? Yes  Agent: Please be advised that Rx refills may take up to 3 business days. We ask that you follow-up with your pharmacy.

## 2024-05-01 ENCOUNTER — Other Ambulatory Visit (INDEPENDENT_AMBULATORY_CARE_PROVIDER_SITE_OTHER): Payer: Self-pay | Admitting: Primary Care

## 2024-05-01 DIAGNOSIS — M62838 Other muscle spasm: Secondary | ICD-10-CM

## 2024-05-01 MED ORDER — GABAPENTIN 300 MG PO CAPS: 600.0000 mg | ORAL_CAPSULE | Freq: Three times a day (TID) | ORAL | 2 refills | Status: DC

## 2024-05-01 NOTE — Telephone Encounter (Signed)
 Requested medications are due for refill today.  yes  Requested medications are on the active medications list.  yes  Last refill. 03/15/2024 #30 0 rf  Future visit scheduled.   yes  Notes to clinic.  Medication refill not delegated.    Requested Prescriptions  Pending Prescriptions Disp Refills   tiZANidine  (ZANAFLEX ) 4 MG tablet 30 tablet 0    Sig: Take 1 tablet (4 mg total) by mouth every 6 (six) hours as needed for muscle spasms.     Not Delegated - Cardiovascular:  Alpha-2 Agonists - tizanidine  Failed - 05/01/2024  7:44 AM      Failed - This refill cannot be delegated      Passed - Valid encounter within last 6 months    Recent Outpatient Visits           1 month ago Uncomplicated asthma, unspecified asthma severity, unspecified whether persistent   [place] [facility] Marius Siemens, NP   7 months ago Muscle spasm   [place] [facility] Marius Siemens, NP   1 year ago Diabetes mellitus with complication [facility])   [place] [facility] Marius Siemens, NP   1 year ago Diabetes mellitus with complication [facility])   [place] [facility] Marius Siemens, NP   2 years ago Annual physical exam   [place] [facility] Marius Siemens, NP              Signed Prescriptions Disp Refills   gabapentin  (NEURONTIN ) 300 MG capsule 180 capsule 2    Sig: Take 2 capsules (600 mg total) by mouth 3 (three) times daily.     Neurology: Anticonvulsants - gabapentin  Failed - 05/01/2024  7:44 AM      Failed - Cr in normal range and within 360 days    Creat  Date Value Ref Range Status  09/16/2016 0.71 0.50 - 1.10 mg/dL Final   Creatinine, Ser  Date Value Ref Range Status  04/13/2024 1.73 (H) 0.44 - 1.00 mg/dL Final   Creatinine,U  Date Value Ref Range Status  02/18/2017 57.2 mg/dL Final   Creatinine, Urine  Date Value Ref Range Status  07/14/2021 193.74 mg/dL Final     Comment:    Performed at [facility], [address], [place], [place] [address]         Passed - Completed PHQ-2 or PHQ-9 in the last 360 days      Passed - Valid encounter within last 12 months    Recent Outpatient Visits           1 month ago Uncomplicated asthma, unspecified asthma severity, unspecified whether persistent   [place] [facility] Marius Siemens, NP   7 months ago Muscle spasm   [place] [facility] Marius Siemens, NP   1 year ago Diabetes mellitus with complication [facility])   [place] [facility] Marius Siemens, NP   1 year ago Diabetes mellitus with complication [facility])   [place] [facility] Marius Siemens, NP   2 years ago Annual physical exam   [place] [facility] Marius Siemens, NP

## 2024-05-01 NOTE — Telephone Encounter (Signed)
 Requested Prescriptions  Pending Prescriptions Disp Refills   gabapentin  (NEURONTIN ) 300 MG capsule 180 capsule 2    Sig: Take 2 capsules (600 mg total) by mouth 3 (three) times daily.     Neurology: Anticonvulsants - gabapentin  Failed - 05/01/2024  7:43 AM      Failed - Cr in normal range and within 360 days    Creat  Date Value Ref Range Status  09/16/2016 0.71 0.50 - 1.10 mg/dL Final   Creatinine, Ser  Date Value Ref Range Status  04/13/2024 1.73 (H) 0.44 - 1.00 mg/dL Final   Creatinine,U  Date Value Ref Range Status  02/18/2017 57.2 mg/dL Final   Creatinine, Urine  Date Value Ref Range Status  07/14/2021 193.74 mg/dL Final    Comment:    Performed at [facility], [address] [place], [place] [address]         Passed - Completed PHQ-2 or PHQ-9 in the last 360 days      Passed - Valid encounter within last 12 months    Recent Outpatient Visits           1 month ago Uncomplicated asthma, unspecified asthma severity, unspecified whether persistent   [place] [facility] Marius Siemens, NP   7 months ago Muscle spasm   [place] [facility] Marius Siemens, NP   1 year ago Diabetes mellitus with complication [facility])   [place] [facility] Marius Siemens, NP   1 year ago Diabetes mellitus with complication [facility])   [place] [facility] Marius Siemens, NP   2 years ago Annual physical exam   [place] [facility] Marius Siemens, NP               tiZANidine  (ZANAFLEX ) 4 MG tablet 30 tablet 0    Sig: Take 1 tablet (4 mg total) by mouth every 6 (six) hours as needed for muscle spasms.     Not Delegated - Cardiovascular:  Alpha-2 Agonists - tizanidine  Failed - 05/01/2024  7:43 AM      Failed - This refill cannot be delegated      Passed - Valid encounter within last 6 months    Recent Outpatient Visits           1 month ago Uncomplicated  asthma, unspecified asthma severity, unspecified whether persistent   [place] [facility] Marius Siemens, NP   7 months ago Muscle spasm   [place] [facility] Marius Siemens, NP   1 year ago Diabetes mellitus with complication [facility])   [place] [facility] Marius Siemens, NP   1 year ago Diabetes mellitus with complication [facility])    [facility] Marius Siemens, NP   2 years ago Annual physical exam    [facility] Marius Siemens, NP

## 2024-05-03 ENCOUNTER — Ambulatory Visit (INDEPENDENT_AMBULATORY_CARE_PROVIDER_SITE_OTHER): Admitting: Primary Care

## 2024-05-03 ENCOUNTER — Encounter (INDEPENDENT_AMBULATORY_CARE_PROVIDER_SITE_OTHER): Payer: Self-pay | Admitting: Primary Care

## 2024-05-03 VITALS — BP 137/82 | HR 92 | Resp 16 | Ht 67.5 in | Wt 376.4 lb

## 2024-05-03 DIAGNOSIS — E559 Vitamin D deficiency, unspecified: Secondary | ICD-10-CM | POA: Diagnosis not present

## 2024-05-03 DIAGNOSIS — R202 Paresthesia of skin: Secondary | ICD-10-CM

## 2024-05-03 DIAGNOSIS — Z760 Encounter for issue of repeat prescription: Secondary | ICD-10-CM | POA: Diagnosis not present

## 2024-05-03 DIAGNOSIS — F418 Other specified anxiety disorders: Secondary | ICD-10-CM

## 2024-05-03 DIAGNOSIS — M62838 Other muscle spasm: Secondary | ICD-10-CM | POA: Diagnosis not present

## 2024-05-03 DIAGNOSIS — I10 Essential (primary) hypertension: Secondary | ICD-10-CM

## 2024-05-03 DIAGNOSIS — F4323 Adjustment disorder with mixed anxiety and depressed mood: Secondary | ICD-10-CM | POA: Diagnosis not present

## 2024-05-03 DIAGNOSIS — R200 Anesthesia of skin: Secondary | ICD-10-CM | POA: Diagnosis not present

## 2024-05-03 DIAGNOSIS — F32A Depression, unspecified: Secondary | ICD-10-CM

## 2024-05-03 MED ORDER — DULOXETINE HCL 30 MG PO CPEP: 30.0000 mg | ORAL_CAPSULE | Freq: Two times a day (BID) | ORAL | 1 refills | Status: DC

## 2024-05-03 MED ORDER — TIZANIDINE HCL 4 MG PO TABS
4.0000 mg | ORAL_TABLET | Freq: Four times a day (QID) | ORAL | 1 refills | Status: DC | PRN
Start: 2024-05-03 — End: 2024-08-03

## 2024-05-03 MED ORDER — NITROGLYCERIN 0.4 MG SL SUBL
0.4000 mg | SUBLINGUAL_TABLET | SUBLINGUAL | 2 refills | Status: AC | PRN
Start: 2024-05-03 — End: ?

## 2024-05-03 NOTE — Progress Notes (Signed)
 Not doing anything [facility]  Lindsay [name], is a 43 y.o. female  ZOX:[id number]  WJX:[id number]  DOB - Jan 27, 1981  Chief Complaint  Patient presents with   Hypertension    Pt states that when she checks her bp at home it is elevated       Subjective:   Lindsay [name] is a 43 y.o. female here today for a follow up visit for elevated blood pressure last visit contacted cardiologist okay to refill medication today she returns with a BP 137/82 made cardiology aware.  Patient also checks her blood pressure at home and ranges from systolic associate patient has No headache, No chest pain, No abdominal pain - No Nausea, No new weakness tingling or numbness, No Cough - shortness of breath HPI  No problems updated.  Comprehensive ROS Pertinent positive and negative noted in HPI   Allergies  Allergen Reactions   Acetaminophen  Itching and Swelling    Itching of the mouth, swelling of tongue and stomach started hurting mild   Hydrazine Yellow [Fd&C Yellow #5 (Tartrazine)] Shortness Of Breath and Swelling    Swelling mostly noticed in legs and feet, retaining urination, shortness of breaht, and minor chest pain   Lisinopril  Shortness Of Breath    Was on prinzide ; had sob/chest pain on it.   Pineapple Itching    Past Medical History:  Diagnosis Date   Anemia    Anxiety    ARF (acute renal failure) (HCC) 04/2015   Arthritis    Asthma    Cellulitis of right upper extremity    Complication of anesthesia    Heart rate drop during epidural,Respiration,and BP   Coronary artery disease    Depression    Diabetes mellitus    insulin  dependent   Hyperlipidemia LDL goal <70    Hypertension    Obesity    S/P angioplasty with stent 08/2016   DES to mLAD and PTCA only to 2nd diag ostium.    Sleep apnea     Current Outpatient Medications on File Prior to Visit  Medication Sig Dispense Refill   acetaminophen  (TYLENOL ) 500 MG tablet Take 2 tablets (1,000 mg total) by  mouth every 8 (eight) hours for 14 days. 84 tablet 0   albuterol  (VENTOLIN  HFA) 108 (90 Base) MCG/ACT inhaler Inhale 2 puffs into the lungs every 6 (six) hours as needed for wheezing or shortness of breath. 18 g 1   allopurinol (ZYLOPRIM) 100 MG tablet Take 100 mg by mouth daily.     aspirin  81 MG tablet Take 1 tablet (81 mg total) by mouth daily. 30 tablet 11   atorvastatin  (LIPITOR ) 40 MG tablet Take 1 tablet (40 mg total) by mouth daily. 90 tablet 1   colchicine 0.6 MG tablet Take 0.6 mg by mouth daily as needed (Gout).     gabapentin  (NEURONTIN ) 300 MG capsule Take 2 capsules (600 mg total) by mouth 3 (three) times daily. 180 capsule 2   hydrALAZINE  (APRESOLINE ) 100 MG tablet Take 1 tablet (100 mg total) by mouth 3 (three) times daily. 180 tablet 1   isosorbide  mononitrate (IMDUR ) 60 MG 24 hr tablet Take 1 tablet (60 mg total) by mouth daily. 90 tablet 3   losartan  (COZAAR ) 100 MG tablet Take 1 tablet (100 mg total) by mouth daily. 90 tablet 3   torsemide  (DEMADEX ) 20 MG tablet Take 2 tablets (40 mg total) by mouth 2 (two) times daily. 360 tablet 3   MOUNJARO 5 MG/0.5ML Pen Inject 5 mg into  the skin once a week.     No current facility-administered medications on file prior to visit.   Health Maintenance  Topic Date Due   Yearly kidney health urinalysis for diabetes  02/18/2018   COVID-19 Vaccine (2 - Pfizer risk series) 12/21/2020   Pneumococcal Vaccination (2 of 2 - PCV) 03/15/2025*   Flu Shot  07/21/2024   Eye exam for diabetics  11/23/2024   Complete foot exam   03/08/2025   Yearly kidney function blood test for diabetes  04/13/2025   Pap with HPV screening  06/03/2026   DTaP/Tdap/Td vaccine (2 - Td or Tdap) 07/28/2026   Hepatitis C Screening  Completed   HIV Screening  Completed   HPV Vaccine  Aged Out   Meningitis B Vaccine  Aged Out   Hemoglobin A1C  Discontinued  *Topic was postponed. The date shown is not the original due date.    Objective:   Vitals:   05/03/24 1028  05/03/24 1036  BP: (!) 141/84 137/82  Pulse: 92   Resp: 16   SpO2: 98%   Weight: (!) 376 lb 6.4 oz (170.7 kg)   Height: 5' 7.5" (1.715 m)    BP Readings from Last 3 Encounters:  05/03/24 137/82  04/25/24 (!) 157/93  04/13/24 (!) 119/55      Physical Exam Vitals reviewed.  Constitutional:      Appearance: Normal appearance. She is obese.     Comments: Severe morbid obesity  HENT:     Head: Normocephalic.     Right Ear: Tympanic membrane, ear canal and external ear normal.     Left Ear: Tympanic membrane, ear canal and external ear normal.     Nose: Nose normal.     Mouth/Throat:     Mouth: Mucous membranes are moist.  Eyes:     Extraocular Movements: Extraocular movements intact.     Pupils: Pupils are equal, round, and reactive to light.  Cardiovascular:     Rate and Rhythm: Normal rate.  Pulmonary:     Effort: Pulmonary effort is normal.     Breath sounds: Normal breath sounds.  Abdominal:     General: Bowel sounds are normal.     Palpations: Abdomen is soft.  Musculoskeletal:        General: Normal range of motion.     Cervical back: Normal range of motion.  Skin:    General: Skin is warm and dry.  Neurological:     Mental Status: She is alert and oriented to person, place, and time.  Psychiatric:        Mood and Affect: Mood normal.        Behavior: Behavior normal.        Thought Content: Thought content normal.     Assessment & Plan  Lindsay [name] was seen today for hypertension.  Diagnoses and all orders for this visit:  Anxiety and depression -     DULoxetine  (CYMBALTA ) 30 MG capsule; Take 1 capsule (30 mg total) by mouth 2 (two) times daily.  Muscle spasm -     tiZANidine  (ZANAFLEX ) 4 MG tablet; Take 1 tablet (4 mg total) by mouth every 6 (six) hours as needed for muscle spasms.  Medication refill -     DULoxetine  (CYMBALTA ) 30 MG capsule; Take 1 capsule (30 mg total) by mouth 2 (two) times daily.  Numbness and tingling -     DULoxetine  (CYMBALTA )  30 MG capsule; Take 1 capsule (30 mg total) by mouth 2 (two) times  daily.  Essential hypertension Followed by cardiology improved  Adjustment disorder with mixed anxiety and depressed mood Followed out side of practice   Vitamin D  deficiency Vitamin D  is needed to make and keep bones strong. Take OTC vitiamin D3 (2000 international units daily  Other orders -     nitroGLYCERIN  (NITROSTAT ) 0.4 MG SL tablet; Place 1 tablet (0.4 mg total) under the tongue every 5 (five) minutes as needed for chest pain. Please keep upcoming appt in October 2022. Final Attempt  Patient have been counseled extensively about nutrition and exercise. Other issues discussed during this visit include: low cholesterol diet, weight control and daily exercise, foot care, annual eye examinations at Ophthalmology, importance of adherence with medications and regular follow-up. We also discussed long term complications of uncontrolled diabetes and hypertension.   Return in about 3 months (around 08/03/2024) for medical conditions.  The patient was given clear instructions to go to ER or return to medical center if symptoms don't improve, worsen or new problems develop. The patient verbalized understanding. The patient was told to call to get lab results if they haven't heard anything in the next week.   This note has been created with Education officer, environmental. Any transcriptional errors are unintentional.   Marius Siemens, NP 05/03/2024, 3:16 PM

## 2024-05-05 DIAGNOSIS — G5601 Carpal tunnel syndrome, right upper limb: Secondary | ICD-10-CM | POA: Diagnosis not present

## 2024-05-08 ENCOUNTER — Ambulatory Visit: Attending: Internal Medicine | Admitting: Internal Medicine

## 2024-05-08 VITALS — BP 127/84 | HR 95 | Ht 67.5 in | Wt 371.0 lb

## 2024-05-08 DIAGNOSIS — I25119 Atherosclerotic heart disease of native coronary artery with unspecified angina pectoris: Secondary | ICD-10-CM | POA: Insufficient documentation

## 2024-05-08 DIAGNOSIS — I252 Old myocardial infarction: Secondary | ICD-10-CM | POA: Diagnosis not present

## 2024-05-08 DIAGNOSIS — G4733 Obstructive sleep apnea (adult) (pediatric): Secondary | ICD-10-CM | POA: Diagnosis not present

## 2024-05-08 DIAGNOSIS — I5032 Chronic diastolic (congestive) heart failure: Secondary | ICD-10-CM | POA: Insufficient documentation

## 2024-05-08 NOTE — Progress Notes (Signed)
 Cardiology Office Note:  .    Date:  05/08/2024  ID:  Lindsay [name], DOB 1981-03-24, MRN [id number] PCP: Marius Siemens, NP  [facility] Providers Cardiologist:  Jann Melody, MD     CC: CAD f/u  History of Present Illness: .    Lindsay [name] is a 43 y.o. female  with a hx of CAD with prior mLAD PCI and D2 intervention, Super morbid obesity, HTN, DM, and OSA- CPAP.  Also AOCD. 2023: Saw APP and started therapy for HFrEF (had decompensated HF eval) 2024: Saw Michelle- no symptoms.  Lindsay [name] is a 43 year old female with coronary artery disease and hypertension who presents for an annual checkup. She was referred by her family medicine nurse practitioner for follow-up.  She feels sleepy but is otherwise doing well. No chest pain or dyspnea, except for asthma-related symptoms. She has a history of coronary artery disease and has undergone mid LAD PCI and D2 intervention. Blood pressure has improved significantly, and she is currently not experiencing any chest pain.  She has been managing her weight and reports a slight weight loss of one to two pounds. She was previously on Mounjaro but discontinued it a week ago.   Discussed the use of AI scribe software for clinical note transcription with the patient, who gave verbal consent to proceed.   Relevant histories: .  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 ROS: As per HPI.   Studies Reviewed: .     Cardiac Studies & Procedures   ______________________________________________________________________________________________ CARDIAC CATHETERIZATION  CARDIAC CATHETERIZATION 04/25/2020  Conclusion  LAD stent is widely patent with perhaps eccentric 20% narrowing in some views.  The jailed second diagonal is widely patent.  Proximal first diagonal contains ostial to proximal segmental 60% narrowing  Normal left main  Moderate irregularities in the circumflex up to 40% in  the first marginal.  Dominant right coronary, luminal irregularities.  Normal LV function with EF 60%.  Normal LVEDP.  RECOMMENDATIONS:   Continue aggressive risk factor modification including greater than 150 minutes of moderate activity 5 out of 7 days of the week, weight loss, LDL less than 70, blood pressure control, and management of potential sleep apnea.  Chest pain and dyspnea do not appear to be related to obstructive disease or heart failure respectively.  Other explanations should be sought.  Findings Coronary Findings Diagnostic  Dominance: Right  Left Anterior Descending Mid LAD lesion is 20% stenosed. Vessel is the culprit lesion. The lesion is type C. The lesion was previously treated.  First Diagonal Branch 1st Diag lesion is 60% stenosed.  First Septal Branch Vessel is small in size.  Second Diagonal [employer] 2nd Diag lesion is 50% stenosed. The lesion is type C and eccentric. The lesion was previously treated.  Left Circumflex Prox Cx to Dist Cx lesion is 30% stenosed.  First Obtuse Marginal Branch Ost 1st Mrg to 1st Mrg lesion is 50% stenosed. 1st Mrg lesion is 50% stenosed.  Right Coronary Artery The vessel exhibits minimal luminal irregularities.  Intervention  No interventions have been documented.   CARDIAC CATHETERIZATION  CARDIAC CATHETERIZATION 09/07/2016  Conclusion  The left ventricular ejection fraction is 35-45% by visual estimate.  LV end diastolic pressure is moderately elevated.  There is no mitral valve regurgitation.   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 This is a culprit for  the patient's presentation and elevated troponins.  Successful PCI and stent implantation reducing the 100% stenosis to 0% with TIMI grade 3 flow. Final postdilatation diameter 3.5 mm. Xience Alpine 3.0 x 18 mm DES was used.  Successful PTCA of second diagonal ostium from 75%  to 50% pre-stenting.  25-40% stenosis in the proximal circumflex, and 50% segmental stenosis in the proximal first obtuse marginal.  Anteroapical akinesis. LVEDP 26 mmHg. Estimated EF 35-45%.  Recommendations:   Aggressive risk factor modification including high-intensity statin therapy, aggressive glycemic control, weight reduction, blood pressure control, low-fat carbohydrate modified diet, and exercise.  Eligible for discharge in 24 hours assuming no complications.  IV heparin  was discontinued.  Aspirin  and Brilinta  6-12 months.  Beta blocker therapy and ACE/ARB to treat what I believe will be reversible LV systolic dysfunction.  Findings Coronary Findings Diagnostic  Dominance: Right  Left Anterior Descending Culprit lesion. The lesion is type C.  Second Diagonal Branch The lesion is type C and eccentric.  Left Circumflex  First Obtuse Marginal Branch  Intervention  Mid LAD to Dist LAD lesion Angioplasty Lesion crossed with guidewire using a WIRE ASAHI PROWATER 180CM. Pre-stent angioplasty was performed using a BALLOON EMERGE MR 2.5X12. A STENT XIENCE ALPINE RX 3.0X18 drug eluting stent was successfully placed. Stent strut is well apposed. Post-stent angioplasty was performed using a BALLOON [employer] EUPHORA RX3.5X8. Maximum pressure: 14 atm. There is no pre-interventional antegrade distal flow (TIMI 0).  The post-interventional distal flow is normal (TIMI 3). The intervention was successful . No complications occurred at this lesion. There is a 0% residual stenosis post intervention.  Ost 2nd Diag lesion Angioplasty Lesion crossed with guidewire using a WIRE HI TORQ BMW 190CM. Angioplasty alone was performed using a BALLOON EMERGE MR 2.5X12. The pre-interventional distal flow is normal (TIMI 3).  The post-interventional distal flow is normal (TIMI 3). The intervention was successful . No complications occurred at this lesion. There is a 50% residual stenosis post  intervention.     ECHOCARDIOGRAM  ECHOCARDIOGRAM COMPLETE 08/15/2022  Narrative ECHOCARDIOGRAM REPORT    Patient Name:   Lindsay [name] Date of Exam: 08/15/2022 Medical Rec #:  [id number]         Height:       67.0 in Accession #:    [id number]        Weight:       420.0 lb Date of Birth:  Jan 23, 1981         BSA:          2.772 m Patient Age:    40 years          BP:           156/73 mmHg Patient Gender: F                 HR:           81 bpm. Exam Location:  Inpatient  Procedure: 2D Echo  Indications:    acute systolic heart failure  History:        Patient has prior history of Echocardiogram examinations, most recent 07/12/2021. CAD; Risk Factors:Diabetes, Dyslipidemia and Hypertension.  Sonographer:    Dione Franks RDCS Referring Phys: [id number] JENNIFER YATES   Sonographer Comments: Technically difficult study due to poor echo windows and patient is obese. Image acquisition challenging due to patient body habitus. IMPRESSIONS   1. Technically difficult study, poor echo windows. Definity  contrast given 2. Left ventricular ejection fraction, by estimation, is 65 to 70%.  Left ventricular ejection fraction by PLAX is 65 %. The left ventricle has normal function. The left ventricle has no regional wall motion abnormalities. There is mild left ventricular hypertrophy. Left ventricular diastolic parameters are indeterminate. 3. Right ventricular systolic function is normal. The right ventricular size is normal. 4. The mitral valve is abnormal. Trivial mitral valve regurgitation. 5. The aortic valve was not well visualized. Aortic valve regurgitation is not visualized. 6. The inferior vena cava is normal in size with greater than 50% respiratory variability, suggesting right atrial pressure of 3 mmHg.  Comparison(s): No significant change from prior study. 07/12/2021: LVEF 65-70%, mild LVH.  FINDINGS Left Ventricle: Left ventricular ejection fraction, by estimation, is 65 to  70%. Left ventricular ejection fraction by PLAX is 65 %. The left ventricle has normal function. The left ventricle has no regional wall motion abnormalities. Definity  contrast agent was given IV to delineate the left ventricular endocardial borders. The left ventricular internal cavity size was normal in size. There is mild left ventricular hypertrophy. Left ventricular diastolic parameters are indeterminate.  Right Ventricle: The right ventricular size is normal. No increase in right ventricular wall thickness. Right ventricular systolic function is normal.  Left Atrium: Left atrial size was normal in size.  Right Atrium: Right atrial size was normal in size.  Pericardium: There is no evidence of pericardial effusion.  Mitral Valve: The mitral valve is abnormal. Mild mitral annular calcification. Trivial mitral valve regurgitation.  Tricuspid Valve: The tricuspid valve is not well visualized. Tricuspid valve regurgitation is not demonstrated.  Aortic Valve: The aortic valve was not well visualized. Aortic valve regurgitation is not visualized.  Pulmonic Valve: The pulmonic valve was not well visualized. Pulmonic valve regurgitation is not visualized.  Aorta: The aortic root and ascending aorta are structurally normal, with no evidence of dilitation.  Venous: The inferior vena cava is normal in size with greater than 50% respiratory variability, suggesting right atrial pressure of 3 mmHg.  IAS/Shunts: No atrial level shunt detected by color flow Doppler.   LEFT VENTRICLE PLAX 2D LV EF:         Left            Diastology ventricular     LV e' medial:    11.40 cm/s ejection        LV E/e' medial:  8.1 fraction by     LV e' lateral:   9.79 cm/s PLAX is 65      LV E/e' lateral: 9.4 %. LVIDd:         4.80 cm LVIDs:         3.10 cm LV PW:         1.20 cm LV IVS:        1.20 cm LVOT diam:     1.90 cm LV SV:         58 LV SV Index:   21 LVOT Area:     2.84 cm   RIGHT VENTRICLE              IVC RV S prime:     15.30 cm/s  IVC diam: 2.00 cm TAPSE (M-mode): 2.0 cm  LEFT ATRIUM           Index        RIGHT ATRIUM           Index LA diam:      4.30 cm 1.55 cm/m   RA Area:     11.90 cm LA Vol (A4C):  57.2 ml 20.64 ml/m  RA Volume:   25.60 ml  9.24 ml/m AORTIC VALVE LVOT Vmax:   116.00 cm/s LVOT Vmean:  73.500 cm/s LVOT VTI:    0.203 m  AORTA Ao Root diam: 2.80 cm Ao Asc diam:  3.30 cm  MITRAL VALVE MV Area (PHT): 3.77 cm    SHUNTS MV Decel Time: 201 msec    Systemic VTI:  0.20 m MV E velocity: 92.40 cm/s  Systemic Diam: 1.90 cm MV A velocity: 76.50 cm/s MV E/A ratio:  1.21  Dinah Franco MD Electronically signed by Dinah Franco MD Signature Date/Time: 08/15/2022/4:57:21 PM    Final          ______________________________________________________________________________________________        Physical Exam:    VS:  BP 127/84 (BP Location: Left Arm)   Pulse 95   Ht 5' 7.5" (1.715 m)   Wt (!) 371 lb (168.3 kg)   LMP 04/26/2024 (Exact Date)   SpO2 97%   BMI 57.25 kg/m    Wt Readings from Last 3 Encounters:  05/08/24 (!) 371 lb (168.3 kg)  05/03/24 (!) 376 lb 6.4 oz (170.7 kg)  04/25/24 (!) 372 lb (168.7 kg)    Gen: no distress   Neck: No JVD Cardiac: No Rubs or Gallops, no Murmur, RRR, distant heart sounds +2 radial pulses Respiratory: Clear to auscultation bilaterally, normal effort, normal  respiratory rate GI: Soft, nontender, non-distended  MS: No  edema;  R arm wrapped for  Integument: Skin feels warm Neuro:  At time of evaluation, alert and oriented to person/place/time/situation  Psych: Normal affect, patient feels ok   ASSESSMENT AND PLAN: .     Coronary artery disease, post-PCI HLD Coronary artery disease status post mid LAD PCI and D2 intervention. No current angina. Blood pressure is well-controlled, and cholesterol levels are improving. Secondary prevention measures are progressing well. - Monitor and adjust  treatment if blood pressure or cholesterol worsens or if angina recurs. - continue current terapy  Hypertension Chronic HFpEF (NYHA I, Euvolemic) Hypertension is significantly improved with well-controlled blood pressure. - Implement more aggressive blood pressure management as weight loss progresses (may be able to stop some medications)  Morbid obesity Morbid obesity with recent weight loss of 1-2 pounds. Mounjaro was discontinued a week ago with planned restart; her HLD may improved. Anticipate further weight loss will improve blood pressure and cholesterol levels. - long term may not need CPAP  One year f/u with Moira Andrews  Two year f/u with me  Gloriann Larger, MD FASE [facility] Cardiologist [facility]  [address] [place], [address] [place], [place] [address] [phone]  11:19 AM

## 2024-05-08 NOTE — Patient Instructions (Signed)
 Medication Instructions:  Your physician recommends that you continue on your current medications as directed. Please refer to the Current Medication list given to you today.  *If you need a refill on your cardiac medications before your next appointment, please call your pharmacy*   Lab Work: NONE If you have labs (blood work) drawn today and your tests are completely normal, you will receive your results only by: MyChart Message (if you have MyChart) OR A paper copy in the mail If you have any lab test that is abnormal or we need to change your treatment, we will call you to review the results.   Testing/Procedures: NONE   Follow-Up: At [facility], you and your health needs are our priority.  As part of our continuing mission to provide you with exceptional heart care, we have created designated Provider Care Teams.  These Care Teams include your primary Cardiologist (physician) and Advanced Practice Providers (APPs -  Physician Assistants and Nurse Practitioners) who all work together to provide you with the care you need, when you need it.  We recommend signing up for the patient portal called "MyChart".  Sign up information is provided on this After Visit   Your next appointment:   1 year(s)  Provider:   Reesa Cannon, PA 2 years Dr. Paulita Boss

## 2024-05-10 DIAGNOSIS — G5601 Carpal tunnel syndrome, right upper limb: Secondary | ICD-10-CM | POA: Diagnosis not present

## 2024-05-10 DIAGNOSIS — Z3200 Encounter for pregnancy test, result unknown: Secondary | ICD-10-CM | POA: Diagnosis not present

## 2024-05-10 DIAGNOSIS — M25511 Pain in right shoulder: Secondary | ICD-10-CM | POA: Diagnosis not present

## 2024-05-10 DIAGNOSIS — M546 Pain in thoracic spine: Secondary | ICD-10-CM | POA: Diagnosis not present

## 2024-05-10 DIAGNOSIS — M47816 Spondylosis without myelopathy or radiculopathy, lumbar region: Secondary | ICD-10-CM | POA: Diagnosis not present

## 2024-05-10 DIAGNOSIS — E611 Iron deficiency: Secondary | ICD-10-CM | POA: Diagnosis not present

## 2024-05-10 DIAGNOSIS — E119 Type 2 diabetes mellitus without complications: Secondary | ICD-10-CM | POA: Diagnosis not present

## 2024-05-12 DIAGNOSIS — Z79899 Other long term (current) drug therapy: Secondary | ICD-10-CM | POA: Diagnosis not present

## 2024-05-19 ENCOUNTER — Other Ambulatory Visit: Payer: Self-pay | Admitting: Internal Medicine

## 2024-05-19 DIAGNOSIS — G5601 Carpal tunnel syndrome, right upper limb: Secondary | ICD-10-CM | POA: Diagnosis not present

## 2024-06-07 DIAGNOSIS — M25561 Pain in right knee: Secondary | ICD-10-CM | POA: Diagnosis not present

## 2024-06-08 DIAGNOSIS — I10 Essential (primary) hypertension: Secondary | ICD-10-CM | POA: Diagnosis not present

## 2024-06-08 DIAGNOSIS — N182 Chronic kidney disease, stage 2 (mild): Secondary | ICD-10-CM | POA: Diagnosis not present

## 2024-06-08 DIAGNOSIS — Z794 Long term (current) use of insulin: Secondary | ICD-10-CM | POA: Diagnosis not present

## 2024-06-08 DIAGNOSIS — E1122 Type 2 diabetes mellitus with diabetic chronic kidney disease: Secondary | ICD-10-CM | POA: Diagnosis not present

## 2024-06-09 DIAGNOSIS — G5601 Carpal tunnel syndrome, right upper limb: Secondary | ICD-10-CM | POA: Diagnosis not present

## 2024-06-09 DIAGNOSIS — E119 Type 2 diabetes mellitus without complications: Secondary | ICD-10-CM | POA: Diagnosis not present

## 2024-06-09 DIAGNOSIS — M47816 Spondylosis without myelopathy or radiculopathy, lumbar region: Secondary | ICD-10-CM | POA: Diagnosis not present

## 2024-06-09 DIAGNOSIS — M25511 Pain in right shoulder: Secondary | ICD-10-CM | POA: Diagnosis not present

## 2024-06-09 DIAGNOSIS — M546 Pain in thoracic spine: Secondary | ICD-10-CM | POA: Diagnosis not present

## 2024-06-09 DIAGNOSIS — Z3200 Encounter for pregnancy test, result unknown: Secondary | ICD-10-CM | POA: Diagnosis not present

## 2024-06-12 ENCOUNTER — Other Ambulatory Visit: Payer: Self-pay

## 2024-06-12 ENCOUNTER — Emergency Department (HOSPITAL_COMMUNITY)
Admission: EM | Admit: 2024-06-12 | Discharge: 2024-06-13 | Disposition: A | Discharge status: Home / Self Care | Attending: Emergency Medicine | Admitting: Emergency Medicine

## 2024-06-12 ENCOUNTER — Encounter (HOSPITAL_COMMUNITY): Payer: Self-pay | Admitting: Emergency Medicine

## 2024-06-12 ENCOUNTER — Emergency Department (HOSPITAL_COMMUNITY)

## 2024-06-12 DIAGNOSIS — K5730 Diverticulosis of large intestine without perforation or abscess without bleeding: Secondary | ICD-10-CM | POA: Diagnosis not present

## 2024-06-12 DIAGNOSIS — R0789 Other chest pain: Secondary | ICD-10-CM | POA: Diagnosis not present

## 2024-06-12 DIAGNOSIS — R1084 Generalized abdominal pain: Secondary | ICD-10-CM | POA: Diagnosis not present

## 2024-06-12 DIAGNOSIS — I110 Hypertensive heart disease with heart failure: Secondary | ICD-10-CM | POA: Diagnosis not present

## 2024-06-12 DIAGNOSIS — R112 Nausea with vomiting, unspecified: Secondary | ICD-10-CM | POA: Diagnosis not present

## 2024-06-12 DIAGNOSIS — D649 Anemia, unspecified: Secondary | ICD-10-CM | POA: Diagnosis not present

## 2024-06-12 DIAGNOSIS — R1013 Epigastric pain: Secondary | ICD-10-CM | POA: Diagnosis not present

## 2024-06-12 DIAGNOSIS — K2900 Acute gastritis without bleeding: Secondary | ICD-10-CM | POA: Insufficient documentation

## 2024-06-12 DIAGNOSIS — Z79899 Other long term (current) drug therapy: Secondary | ICD-10-CM | POA: Diagnosis not present

## 2024-06-12 DIAGNOSIS — I2510 Atherosclerotic heart disease of native coronary artery without angina pectoris: Secondary | ICD-10-CM | POA: Diagnosis not present

## 2024-06-12 DIAGNOSIS — E1165 Type 2 diabetes mellitus with hyperglycemia: Secondary | ICD-10-CM | POA: Diagnosis not present

## 2024-06-12 DIAGNOSIS — D72829 Elevated white blood cell count, unspecified: Secondary | ICD-10-CM | POA: Diagnosis not present

## 2024-06-12 DIAGNOSIS — I5022 Chronic systolic (congestive) heart failure: Secondary | ICD-10-CM | POA: Insufficient documentation

## 2024-06-12 LAB — CBC WITH DIFFERENTIAL/PLATELET
Abs Immature Granulocytes: 0.04 10*3/uL (ref 0.00–0.07)
Basophils Absolute: 0.1 10*3/uL (ref 0.0–0.1)
Basophils Relative: 1 %
Eosinophils Absolute: 0.2 10*3/uL (ref 0.0–0.5)
Eosinophils Relative: 2 %
HCT: 34.7 % — ABNORMAL LOW (ref 36.0–46.0)
Hemoglobin: 10.7 g/dL — ABNORMAL LOW (ref 12.0–15.0)
Immature Granulocytes: 0 %
Lymphocytes Relative: 26 %
Lymphs Abs: 3.1 10*3/uL (ref 0.7–4.0)
MCH: 26.1 pg (ref 26.0–34.0)
MCHC: 30.8 g/dL (ref 30.0–36.0)
MCV: 84.6 fL (ref 80.0–100.0)
Monocytes Absolute: 0.9 10*3/uL (ref 0.1–1.0)
Monocytes Relative: 7 %
Neutro Abs: 7.6 10*3/uL (ref 1.7–7.7)
Neutrophils Relative %: 64 %
Platelets: 334 10*3/uL (ref 150–400)
RBC: 4.1 MIL/uL (ref 3.87–5.11)
RDW: 14.6 % (ref 11.5–15.5)
WBC: 11.9 10*3/uL — ABNORMAL HIGH (ref 4.0–10.5)
nRBC: 0 % (ref 0.0–0.2)

## 2024-06-12 LAB — I-STAT CHEM 8, ED
BUN: 11 mg/dL (ref 6–20)
Calcium, Ion: 1.17 mmol/L (ref 1.15–1.40)
Chloride: 98 mmol/L (ref 98–111)
Creatinine, Ser: 0.9 mg/dL (ref 0.44–1.00)
Glucose, Bld: 308 mg/dL — ABNORMAL HIGH (ref 70–99)
HCT: 35 % — ABNORMAL LOW (ref 36.0–46.0)
Hemoglobin: 11.9 g/dL — ABNORMAL LOW (ref 12.0–15.0)
Potassium: 4.3 mmol/L (ref 3.5–5.1)
Sodium: 137 mmol/L (ref 135–145)
TCO2: 24 mmol/L (ref 22–32)

## 2024-06-12 LAB — COMPREHENSIVE METABOLIC PANEL WITH GFR
ALT: 15 U/L (ref 0–44)
AST: 25 U/L (ref 15–41)
Albumin: 3.3 g/dL — ABNORMAL LOW (ref 3.5–5.0)
Alkaline Phosphatase: 61 U/L (ref 38–126)
Anion gap: 12 (ref 5–15)
BUN: 11 mg/dL (ref 6–20)
CO2: 26 mmol/L (ref 22–32)
Calcium: 9.3 mg/dL (ref 8.9–10.3)
Chloride: 98 mmol/L (ref 98–111)
Creatinine, Ser: 0.99 mg/dL (ref 0.44–1.00)
GFR, Estimated: >60 mL/min (ref >60)
Glucose, Bld: 305 mg/dL — ABNORMAL HIGH (ref 70–99)
Potassium: 4.3 mmol/L (ref 3.5–5.1)
Sodium: 136 mmol/L (ref 135–145)
Total Bilirubin: 0.6 mg/dL (ref 0.0–1.2)
Total Protein: 7 g/dL (ref 6.5–8.1)

## 2024-06-12 LAB — URINALYSIS, ROUTINE W REFLEX MICROSCOPIC
Bacteria, UA: NONE SEEN
Bilirubin Urine: NEGATIVE
Glucose, UA: >=500 mg/dL — AB
Hgb urine dipstick: NEGATIVE
Ketones, ur: NEGATIVE mg/dL
Leukocytes,Ua: NEGATIVE
Nitrite: NEGATIVE
Protein, ur: 30 mg/dL — AB
Specific Gravity, Urine: 1.015 (ref 1.005–1.030)
pH: 7 (ref 5.0–8.0)

## 2024-06-12 LAB — LIPASE, BLOOD: Lipase: 30 U/L (ref 11–51)

## 2024-06-12 LAB — HCG, SERUM, QUALITATIVE: Preg, Serum: NEGATIVE

## 2024-06-12 MED ORDER — FENTANYL CITRATE PF 50 MCG/ML IJ SOSY
50.0000 ug | PREFILLED_SYRINGE | Freq: Once | INTRAMUSCULAR | Status: AC
  Administered 2024-06-12: 50 ug via INTRAVENOUS
  Filled 2024-06-12: qty 1

## 2024-06-12 MED ORDER — ONDANSETRON HCL 4 MG/2ML IJ SOLN
4.0000 mg | Freq: Once | INTRAMUSCULAR | Status: AC
  Administered 2024-06-12: 4 mg via INTRAVENOUS
  Filled 2024-06-12: qty 2

## 2024-06-12 MED ORDER — IOHEXOL 350 MG/ML SOLN
75.0000 mL | Freq: Once | INTRAVENOUS | Status: AC | PRN
  Administered 2024-06-12: 75 mL via INTRAVENOUS

## 2024-06-12 NOTE — ED Provider Triage Note (Signed)
 Emergency Medicine Provider Triage Evaluation Note  Lindsay [name] , a 43 y.o. female  was evaluated in triage.  Pt complains of nausea/vomiting, chest pain, abdominal pain.  States this began on Saturday when she [place] the mall food [place], has had worsening abdominal discomfort and also chest pain.  States that she took her home nitroglycerin , this helped relieve the pain but did not completely resolve the pain..  Review of Systems  Positive: Chest pain, shortness of breath, nausea, vomiting Negative:   Physical Exam  BP (!) 192/100   Pulse 93   Temp 99.8 F (37.7 C) (Oral)   Resp (!) 21   Ht 5' 7.5 (1.715 m)   Wt (!) 172.7 kg   SpO2 98%   BMI 58.75 kg/m  Gen:   Awake, no distress   Resp:  Normal effort, good air entry into all lung fields with no adventitious sounds MSK:   Moves extremities without difficulty  Other:  Abdomen is diffusely tender especially to the right upper quadrant.  Medical Decision Making  Medically screening exam initiated at 9:49 PM.  Appropriate orders placed.  Lindsay [name] was informed that the remainder of the evaluation will be completed by another provider, this initial triage assessment does not replace that evaluation, and the importance of remaining in the ED until their evaluation is complete.  Abdominal pain panel ordered as is chest pain rule out with troponin.   Lindsay [name], [place] 06/12/24 [id number]

## 2024-06-12 NOTE — ED Triage Notes (Signed)
 Pt c/o N/V, SHOB, CP, and headache since Saturday.

## 2024-06-13 LAB — TROPONIN I (HIGH SENSITIVITY)
Troponin I (High Sensitivity): 8 ng/L (ref <18)
Troponin I (High Sensitivity): 5 ng/L (ref <18)

## 2024-06-13 MED ORDER — SODIUM CHLORIDE 0.9 % IV BOLUS
1000.0000 mL | Freq: Once | INTRAVENOUS | Status: AC
  Administered 2024-06-13: 1000 mL via INTRAVENOUS

## 2024-06-13 MED ORDER — ONDANSETRON 4 MG PO TBDP
4.0000 mg | ORAL_TABLET | Freq: Three times a day (TID) | ORAL | 0 refills | Status: DC | PRN
Start: 2024-06-13 — End: 2024-10-25

## 2024-06-13 MED ORDER — FAMOTIDINE 20 MG PO TABS
20.0000 mg | ORAL_TABLET | Freq: Two times a day (BID) | ORAL | 0 refills | Status: AC
Start: 2024-06-13 — End: ?

## 2024-06-13 MED ORDER — ONDANSETRON HCL 4 MG/2ML IJ SOLN
4.0000 mg | Freq: Once | INTRAMUSCULAR | Status: AC
  Administered 2024-06-13: 4 mg via INTRAVENOUS
  Filled 2024-06-13: qty 2

## 2024-06-13 MED ORDER — PANTOPRAZOLE SODIUM 20 MG PO TBEC: 20.0000 mg | DELAYED_RELEASE_TABLET | Freq: Every day | ORAL | 0 refills | Status: DC

## 2024-06-13 MED ORDER — PANTOPRAZOLE SODIUM 40 MG IV SOLR
40.0000 mg | Freq: Once | INTRAVENOUS | Status: AC
  Administered 2024-06-13: 40 mg via INTRAVENOUS
  Filled 2024-06-13: qty 10

## 2024-06-13 MED ORDER — SUCRALFATE 1 G PO TABS: 1.0000 g | ORAL_TABLET | Freq: Three times a day (TID) | ORAL | 1 refills | Status: DC

## 2024-06-13 NOTE — Discharge Instructions (Addendum)
 Please read and follow all provided instructions.  Your diagnoses today include:  1. Acute gastritis without hemorrhage, unspecified gastritis type   2. Nausea and vomiting, unspecified vomiting type   3. Generalized abdominal pain     Tests performed today include: Complete blood cell count: Mild anemia, unchanged Complete metabolic panel: Blood sugar was high Lipase (pancreas function test): Normal Blood test for stress on the heart: Were both normal Chest x-ray: No concerns EKG unchanged CT scan of the abdomen and pelvis: Shows signs of gastritis Vital signs. See below for your results today.   Medications prescribed:  Pantoprazole - stomach acid reducer (Take this medication everyday)  Pepcid (famotidine) -stomach acid reducer (Take this medication twice a day for one week)  Carafate - for stomach upset and to protect your stomach (Take with meals and at bedtime)  Zofran  (ondansetron ) - for nausea and vomiting  Take any prescribed medications only as directed.  Home care instructions:  Follow any educational materials contained in this packet.  Follow-up instructions: Please follow-up with your primary care provider in the next 7 days for further evaluation of your symptoms.    Return instructions:  SEEK IMMEDIATE MEDICAL ATTENTION IF: The pain does not go away or becomes severe  A temperature above 101F develops  Repeated vomiting occurs (multiple episodes)  The pain becomes localized to portions of the abdomen. The right side could possibly be appendicitis. In an adult, the left lower portion of the abdomen could be colitis or diverticulitis.  Blood is being passed in stools or vomit (bright red or black tarry stools)  You develop chest pain, difficulty breathing, dizziness or fainting, or become confused, poorly responsive, or inconsolable (young children) If you have any other emergent concerns regarding your health  Additional Information: Abdominal (belly) pain  can be caused by many things. Your caregiver performed an examination and possibly ordered blood/urine tests and imaging (CT scan, x-rays, ultrasound). Many cases can be observed and treated at home after initial evaluation in the emergency department. Even though you are being discharged home, abdominal pain can be unpredictable. Therefore, you need a repeated exam if your pain does not resolve, returns, or worsens. Most patients with abdominal pain don't have to be admitted to the hospital or have surgery, but serious problems like appendicitis and gallbladder attacks can start out as nonspecific pain. Many abdominal conditions cannot be diagnosed in one visit, so follow-up evaluations are very important.  Your vital signs today were: BP (!) 156/84   Pulse 87   Temp 98.8 F (37.1 C) (Oral)   Resp 18   Ht 5' 7.5 (1.715 m)   Wt (!) 172.7 kg   SpO2 96%   BMI 58.75 kg/m  If your blood pressure (bp) was elevated above 135/85 this visit, please have this repeated by your doctor within one month. --------------

## 2024-06-13 NOTE — ED Provider Notes (Signed)
 [place] EMERGENCY DEPARTMENT AT [facility] Provider Note   CSN: [id number] Arrival date & time: 06/12/24  2043     Patient presents with: Nausea   Lindsay [name] is a 43 y.o. female.   Patient with history of coronary artery disease, diabetes, hypertension and hyperlipidemia, chronic heart failure --presents to the emergency department today for evaluation of nausea and vomiting.  Patient states that the symptoms started 3 nights ago after eating at a restaurant.  She continues to have persistent nausea.  She also reports mid chest pain described as a burning and shortness of breath.  Abdominal pain in the epigastrium.  She did try nitroglycerin  at one point.  No fevers.  She denies heavy NSAID or alcohol  use.  No history of PUD that she is aware of.  Patient has had 11-hour wait time in the emergency department.         Prior to Admission medications   Medication Sig Start Date End Date Taking? Authorizing Provider  albuterol  (VENTOLIN  HFA) 108 (90 Base) MCG/ACT inhaler Inhale 2 puffs into the lungs every 6 (six) hours as needed for wheezing or shortness of breath. 03/15/24   Celestia Rosaline SQUIBB, NP  allopurinol (ZYLOPRIM) 100 MG tablet Take 100 mg by mouth daily.    [provider]  aspirin  81 MG tablet Take 1 tablet (81 mg total) by mouth daily. 01/13/18   Kristy Sharolyn Lenis, PA-C  atorvastatin  (LIPITOR ) 40 MG tablet Take 1 tablet (40 mg total) by mouth daily. 03/17/24   Celestia Rosaline SQUIBB, NP  colchicine 0.6 MG tablet Take 0.6 mg by mouth daily as needed (Gout). 03/20/24   [provider]  DULoxetine  (CYMBALTA ) 30 MG capsule Take 1 capsule (30 mg total) by mouth 2 (two) times daily. 05/03/24   Celestia Rosaline SQUIBB, NP  gabapentin  (NEURONTIN ) 300 MG capsule Take 2 capsules (600 mg total) by mouth 3 (three) times daily. 05/01/24   Celestia Rosaline SQUIBB, NP  hydrALAZINE  (APRESOLINE ) 100 MG tablet Take 1 tablet (100 mg total) by mouth 3 (three) times daily.  04/06/24   Celestia Rosaline SQUIBB, NP  isosorbide  mononitrate (IMDUR ) 60 MG 24 hr tablet Take 1 tablet (60 mg total) by mouth daily. 03/09/23   Chandrasekhar, Mahesh A, MD  losartan  (COZAAR ) 100 MG tablet TAKE 1 TABLET(100 MG) BY MOUTH DAILY 05/19/24   Chandrasekhar, Mahesh A, MD  MOUNJARO 5 MG/0.5ML Pen Inject 5 mg into the skin once a week. 12/20/23   [provider]  nitroGLYCERIN  (NITROSTAT ) 0.4 MG SL tablet Place 1 tablet (0.4 mg total) under the tongue every 5 (five) minutes as needed for chest pain. Please keep upcoming appt in October 2022. Final Attempt 05/03/24   Celestia Rosaline SQUIBB, NP  tiZANidine  (ZANAFLEX ) 4 MG tablet Take 1 tablet (4 mg total) by mouth every 6 (six) hours as needed for muscle spasms. 05/03/24   Celestia Rosaline SQUIBB, NP  torsemide  (DEMADEX ) 20 MG tablet Take 2 tablets (40 mg total) by mouth 2 (two) times daily. 03/15/24   Celestia Rosaline SQUIBB, NP    Allergies: Acetaminophen , Hydrazine yellow [fd&c yellow #5 (tartrazine)], Lisinopril , and Pineapple    Review of Systems  Updated Vital Signs BP (!) 168/88   Pulse 85   Temp 98.6 F (37 C) (Oral)   Resp 16   Ht 5' 7.5 (1.715 m)   Wt (!) 172.7 kg   SpO2 96%   BMI 58.75 kg/m   Physical Exam Vitals and nursing note reviewed.  Constitutional:      General: She is not in acute distress.    Appearance: She is well-developed.  HENT:     Head: Normocephalic and atraumatic.     Right Ear: External ear normal.     Left Ear: External ear normal.     Nose: Nose normal.   Eyes:     Conjunctiva/sclera: Conjunctivae normal.    Cardiovascular:     Rate and Rhythm: Normal rate and regular rhythm.     Heart sounds: No murmur heard. Pulmonary:     Effort: No respiratory distress.     Breath sounds: No wheezing, rhonchi or rales.  Abdominal:     Palpations: Abdomen is soft.     Tenderness: There is abdominal tenderness in the epigastric area and left upper quadrant. There is no guarding or rebound.    Musculoskeletal:     Cervical back: Normal range of motion and neck supple.     Right lower leg: No edema.     Left lower leg: No edema.   Skin:    General: Skin is warm and dry.     Findings: No rash.   Neurological:     General: No focal deficit present.     Mental Status: She is alert. Mental status is at baseline.     Motor: No weakness.   Psychiatric:        Mood and Affect: Mood normal.     (all labs ordered are listed, but only abnormal results are displayed) Labs Reviewed  COMPREHENSIVE METABOLIC PANEL WITH GFR - Abnormal; Notable for the following components:      Result Value   Glucose, Bld 305 (*)    Albumin 3.3 (*)    All other components within normal limits  CBC WITH DIFFERENTIAL/PLATELET - Abnormal; Notable for the following components:   WBC 11.9 (*)    Hemoglobin 10.7 (*)    HCT 34.7 (*)    All other components within normal limits  URINALYSIS, ROUTINE W REFLEX MICROSCOPIC - Abnormal; Notable for the following components:   Glucose, UA >=500 (*)    Protein, ur 30 (*)    All other components within normal limits  I-STAT CHEM 8, ED - Abnormal; Notable for the following components:   Glucose, Bld 308 (*)    Hemoglobin 11.9 (*)    HCT 35.0 (*)    All other components within normal limits  LIPASE, BLOOD  HCG, SERUM, QUALITATIVE  TROPONIN I (HIGH SENSITIVITY)  TROPONIN I (HIGH SENSITIVITY)   ED Course  Patient seen and examined. History obtained directly from patient. Work-up including labs, imaging, EKG ordered in triage, if performed, were reviewed.    Labs/EKG: Independently reviewed and interpreted.  This included: CBC with mildly elevated white blood cell count at 11.9, slightly low hemoglobin at 10.7, hemoglobin is stable with other results over the past year and a half; CMP glucose elevated with 305 with normal anion gap otherwise unremarkable with normal liver function; UA without compelling signs of infection; troponin 8 > 5 not suggestive of  ischemia.  Imaging: Independently visualized and interpreted.  This included: CT scan of the abdomen pelvis, with marked wall thickening of the stomach consistent with gastritis, unremarkable gallbladder  Medications/Fluids: Ordered: IV fluid bolus, IV Protonix.  Patient received Zofran  last at 4 AM.  Most recent vital signs reviewed and are as follows: BP (!) 147/79   Pulse 97   Temp 98.6 F (37 C) (Oral)   Resp 16  Ht 5' 7.5 (1.715 m)   Wt (!) 172.7 kg   SpO2 99%   BMI 58.75 kg/m   Initial impression: Patient with abdominal pain, nausea and vomiting, chest pain --workup to this point is most suggestive of gastritis.  Low concern for cardiac etiology.  No evidence of diabetic emergency.  No other signs of infection or obstruction on CT imaging.  Do not suspect PE.  Will treat with IV fluids, have patient fluid challenge as well.  10:19 AM Reassessment performed. Patient appears stable.  She has received IV fluids.  No further vomiting.  Reviewed pertinent lab work and imaging with patient at bedside. Questions answered.   Most current vital signs reviewed and are as follows: BP (!) 156/84   Pulse 87   Temp 98.8 F (37.1 C) (Oral)   Resp 18   Ht 5' 7.5 (1.715 m)   Wt (!) 172.7 kg   SpO2 96%   BMI 58.75 kg/m   Plan: Discharge to home.   Prescriptions written for: Protonix, Pepcid, Carafate, Zofran   Other home care instructions discussed: Bland diet, slow advancement.  We discussed avoiding NSAIDs and alcohol .  ED return instructions discussed: The patient was urged to return to the Emergency Department immediately with worsening of current symptoms, worsening abdominal pain, persistent vomiting, blood noted in stools, fever, or any other concerns. The patient verbalized understanding.   Follow-up instructions discussed: Patient encouraged to follow-up with their PCP in 7 days.    ED ECG REPORT   Date: 06/13/2024  Rate: 95  Rhythm: normal sinus rhythm  QRS Axis:  normal  Intervals: normal  ST/T Wave abnormalities: normal  Conduction Disutrbances:none  Narrative Interpretation:   Old EKG Reviewed: unchanged except slower today from 04/13/24  I have personally reviewed the EKG tracing and agree with the computerized printout as noted.   Radiology: CT ABDOMEN PELVIS W CONTRAST Result Date: 06/12/2024 CLINICAL DATA:  Diffuse abdominal pain, nausea, vomiting EXAM: CT ABDOMEN AND PELVIS WITH CONTRAST TECHNIQUE: Multidetector CT imaging of the abdomen and pelvis was performed using the standard protocol following bolus administration of intravenous contrast. RADIATION DOSE REDUCTION: This exam was performed according to the departmental dose-optimization program which includes automated exposure control, adjustment of the mA and/or kV according to patient size and/or use of iterative reconstruction technique. CONTRAST:  75mL OMNIPAQUE  IOHEXOL  350 MG/ML SOLN COMPARISON:  09/06/2016 FINDINGS: Lower chest: No acute abnormality Hepatobiliary: Diffuse low-density throughout the liver compatible with fatty infiltration. No focal abnormality. Gallbladder unremarkable. Pancreas: No focal abnormality or ductal dilatation. Spleen: Normal size. Scattered calcifications compatible with old granulomatous disease. Adrenals/Urinary Tract: No adrenal abnormality. No focal renal abnormality. No stones or hydronephrosis. Urinary bladder is unremarkable. Stomach/Bowel: Markedly thickened gastric wall diffusely measuring up to 2 cm in thickness compatible with gastritis. Small bowel and large bowel unremarkable. Scattered left colonic diverticulosis. Vascular/Lymphatic: No evidence of aneurysm or adenopathy. Reproductive: Uterus and adnexa unremarkable.  No mass. Other: No free fluid or free air. Musculoskeletal: No acute bony abnormality. IMPRESSION: Marked gastric wall thickening diffusely compatible with gastritis. Hepatic steatosis. 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  Electronically Signed   By: Franky Crease M.D.   On: 06/12/2024 23:57   DG Chest 2 View Result Date: 06/12/2024 CLINICAL DATA:  Chest discomfort EXAM: CHEST - 2 VIEW COMPARISON:  09/28/2023 FINDINGS: The heart size and mediastinal contours are within normal limits. Both lungs are clear. The visualized skeletal structures are unremarkable. IMPRESSION: No active cardiopulmonary disease. Electronically Signed   By:  Franky Crease M.D.   On: 06/12/2024 23:08     Procedures   Medications Ordered in the ED  sodium chloride  0.9 % bolus 1,000 mL (has no administration in time range)  pantoprazole (PROTONIX) injection 40 mg (has no administration in time range)  fentaNYL  (SUBLIMAZE ) injection 50 mcg (50 mcg Intravenous Given 06/12/24 2252)  ondansetron  (ZOFRAN ) injection 4 mg (4 mg Intravenous Given 06/12/24 2252)  iohexol  (OMNIPAQUE ) 350 MG/ML injection 75 mL (75 mLs Intravenous Contrast Given 06/12/24 2351)  ondansetron  (ZOFRAN ) injection 4 mg (4 mg Intravenous Given 06/13/24 0434)                                    Medical Decision Making Risk Prescription drug management.   For this patient's complaint of abdominal pain, the following conditions were considered on the differential diagnosis: gastritis/PUD, enteritis/duodenitis, appendicitis, cholelithiasis/cholecystitis, cholangitis, pancreatitis, ruptured viscus, colitis, diverticulitis, small/large bowel obstruction, proctitis, cystitis, pyelonephritis, ureteral colic, aortic dissection, aortic aneurysm. In women, ectopic pregnancy, pelvic inflammatory disease, ovarian cysts, and tubo-ovarian abscess were also considered. Atypical chest etiologies were also considered including ACS, PE, and pneumonia.  For this patient's complaint of chest pain, the following emergent conditions were considered on the differential diagnosis: acute coronary syndrome, pulmonary embolism, pneumothorax, myocarditis, pericardial tamponade, aortic dissection, thoracic  aortic aneurysm complication, esophageal perforation.   Other causes were also considered including: gastroesophageal reflux disease, musculoskeletal pain including costochondritis, pneumonia/pleurisy, herpes zoster, pericarditis.  Workup with most likely cause of the patient's chest pain related to gastritis/esophagitis.  In regards to possibility of ACS, patient has atypical features of pain, non-ischemic and unchanged EKG and negative troponin(s).   In regards to possibility of PE, symptoms are atypical for PE and risk profile is low, no clinical signs of DVT, other reasonable cause of symptoms.   The patient's vital signs, pertinent lab work and imaging were reviewed and interpreted as discussed in the ED course. Hospitalization was considered for further testing, treatments, or serial exams/observation. However as patient is well-appearing, has a stable exam, and reassuring studies today, I do not feel that they warrant admission at this time. This plan was discussed with the patient who verbalizes agreement and comfort with this plan and seems reliable and able to return to the Emergency Department with worsening or changing symptoms.        Final diagnoses:  Acute gastritis without hemorrhage, unspecified gastritis type  Nausea and vomiting, unspecified vomiting type  Generalized abdominal pain    ED Discharge Orders          Ordered    pantoprazole (PROTONIX) 20 MG tablet  Daily        06/13/24 1015    famotidine (PEPCID) 20 MG tablet  2 times daily        06/13/24 1015    sucralfate (CARAFATE) 1 g tablet  3 times daily with meals & bedtime        06/13/24 1015    ondansetron  (ZOFRAN -ODT) 4 MG disintegrating tablet  Every 8 hours PRN        06/13/24 1015               Courtenay Hirth, PA-C 06/13/24 1020    [place], Victoria K, DO 06/13/24 1058

## 2024-06-21 ENCOUNTER — Ambulatory Visit: Admitting: Podiatry

## 2024-06-21 ENCOUNTER — Encounter: Payer: Self-pay | Admitting: Podiatry

## 2024-06-21 DIAGNOSIS — M79675 Pain in left toe(s): Secondary | ICD-10-CM | POA: Diagnosis not present

## 2024-06-21 DIAGNOSIS — S90229A Contusion of unspecified lesser toe(s) with damage to nail, initial encounter: Secondary | ICD-10-CM | POA: Diagnosis not present

## 2024-06-21 DIAGNOSIS — E1142 Type 2 diabetes mellitus with diabetic polyneuropathy: Secondary | ICD-10-CM

## 2024-06-21 DIAGNOSIS — B351 Tinea unguium: Secondary | ICD-10-CM

## 2024-06-21 DIAGNOSIS — M79674 Pain in right toe(s): Secondary | ICD-10-CM | POA: Diagnosis not present

## 2024-06-24 NOTE — Progress Notes (Signed)
 Subjective:  Patient ID: Lindsay [name], female    DOB: 1981-05-01,  MRN: [id number]  Lindsay [name] presents to clinic today for at risk foot care with history of diabetic neuropathy and painful thick toenails that are difficult to trim. Pain interferes with ambulation. Aggravating factors include wearing enclosed shoe gear. Pain is relieved with periodic professional debridement. She states she stubbed her right 2nd digit. Chief Complaint  Patient presents with   [facility]    [facility] IDDM A1C 8.6 Toenail trim. LOV with PCP 02/2024   New problem(s): None.   PCP is Celestia Rosaline SQUIBB, NP.  Allergies  Allergen Reactions   Acetaminophen  Itching and Swelling    Itching of the mouth, swelling of tongue and stomach started hurting mild   Hydrazine Yellow [Fd&C Yellow #5 (Tartrazine)] Shortness Of Breath and Swelling    Swelling mostly noticed in legs and feet, retaining urination, shortness of breaht, and minor chest pain   Lisinopril  Shortness Of Breath    Was on prinzide ; had sob/chest pain on it.   Pineapple Itching    Review of Systems: Negative except as noted in the HPI.  Objective: No changes noted in today's physical examination. There were no vitals filed for this visit. Lindsay [name] is a pleasant 43 y.o. female morbidly obese in NAD. AAO x 3.  Vascular Examination: Capillary refill time immediate b/l. Palpable pedal pulses. Pedal hair absent b/l. No pain with calf compression b/l. Skin temperature gradient WNL b/l. No cyanosis or clubbing b/l. No ischemia or gangrene noted b/l. Trace edema noted BLE.  Neurological Examination: Pt has subjective symptoms of neuropathy. Protective sensation diminished with 10g monofilament b/l.  Dermatological Examination: Pedal skin with normal turgor, texture and tone b/l.  No open wounds. No interdigital macerations.   Toenails 1-5 bilaterally elongated, discolored, dystrophic, thickened, and crumbly with subungual debris and  tenderness to dorsal palpation. Incurvated nailplate both borders of left hallux and both borders of right hallux.  Nail border hypertrophy absent. There is tenderness to palpation. Sign(s) of infection: no clinical signs of infection noted on examination today.. There is evidence of subacute subungual hematoma of the right second digit. Nailplate remains adhered. There is no  tenderness to palpation. No hyperkeratotic nor porokeratotic lesions present on today's visit.  Musculoskeletal Examination: Muscle strength 5/5 to all lower extremity muscle groups bilaterally. Pes planus deformity noted bilateral LE.SABRA No pain, crepitus or joint limitation noted with ROM b/l LE.  Patient ambulates independently without assistive aids.  Radiographs: None  Last A1c:      Latest Ref Rng & Units 04/13/2024   11:32 AM 09/13/2023   11:52 AM  Hemoglobin A1C  Hemoglobin-A1c 4.8 - 5.6 % 9.8  9.1    Assessment/Plan: 1. Pain due to onychomycosis of toenails of both feet   2. Subungual hematoma of second toe   3. Diabetic peripheral neuropathy associated with type 2 diabetes mellitus (HCC)   -Patient was evaluated today. All questions/concerns addressed on today's visit. -Monitor right 2nd digit. No treatment for now. -Patient to continue soft, supportive shoe gear daily. -Toenails 1-5 bilaterally were debrided in length and girth with sterile nail nippers and dremel. Pinpoint bleeding of left great toe addressed with Lumicain Hemostatic Solution, cleansed with alcohol . Triple antibiotic ointment applied. No further treatment required by patient/caregiver. -Discussed chronicity of ingrown toenail(s) of bilateral great toes. Recommended patient consider having matrixectomy performed to alleviate chronic ingrown toenail(s). Discussed in-office procedure and post-procedure instructions. This is not an option due  to elevated A1c. Explained she needs to get blood sugar under control before any elective procedures can be  performed. She related understanding. -Patient/POA to call should there be question/concern in the interim.   Return in about 3 months (around 09/21/2024).  Delon LITTIE Merlin, DPM      [place] LOCATION: [address] [place] [address]                   Office [phone]   [facility] LOCATION: [address] [place], [place] [address] Office [phone]

## 2024-07-10 ENCOUNTER — Other Ambulatory Visit: Payer: Self-pay | Admitting: Internal Medicine

## 2024-07-10 DIAGNOSIS — M25511 Pain in right shoulder: Secondary | ICD-10-CM | POA: Diagnosis not present

## 2024-07-10 DIAGNOSIS — Z3200 Encounter for pregnancy test, result unknown: Secondary | ICD-10-CM | POA: Diagnosis not present

## 2024-07-10 DIAGNOSIS — G5601 Carpal tunnel syndrome, right upper limb: Secondary | ICD-10-CM | POA: Diagnosis not present

## 2024-07-10 DIAGNOSIS — G4733 Obstructive sleep apnea (adult) (pediatric): Secondary | ICD-10-CM | POA: Diagnosis not present

## 2024-07-10 DIAGNOSIS — M546 Pain in thoracic spine: Secondary | ICD-10-CM | POA: Diagnosis not present

## 2024-07-10 DIAGNOSIS — M47816 Spondylosis without myelopathy or radiculopathy, lumbar region: Secondary | ICD-10-CM | POA: Diagnosis not present

## 2024-07-10 DIAGNOSIS — E119 Type 2 diabetes mellitus without complications: Secondary | ICD-10-CM | POA: Diagnosis not present

## 2024-07-10 DIAGNOSIS — Z6841 Body Mass Index (BMI) 40.0 and over, adult: Secondary | ICD-10-CM | POA: Diagnosis not present

## 2024-07-10 DIAGNOSIS — K2970 Gastritis, unspecified, without bleeding: Secondary | ICD-10-CM | POA: Diagnosis not present

## 2024-07-12 DIAGNOSIS — Z79899 Other long term (current) drug therapy: Secondary | ICD-10-CM | POA: Diagnosis not present

## 2024-07-24 DIAGNOSIS — M47816 Spondylosis without myelopathy or radiculopathy, lumbar region: Secondary | ICD-10-CM | POA: Diagnosis not present

## 2024-07-24 DIAGNOSIS — Z6841 Body Mass Index (BMI) 40.0 and over, adult: Secondary | ICD-10-CM | POA: Diagnosis not present

## 2024-08-01 ENCOUNTER — Ambulatory Visit

## 2024-08-01 NOTE — Therapy (Incomplete)
 OUTPATIENT PHYSICAL THERAPY THORACOLUMBAR EVALUATION   Patient Name: Lindsay [name] MRN: [id number] DOB:Apr 30, 1981, 43 y.o., female Today's Date: 08/01/2024  END OF SESSION:   Past Medical History:  Diagnosis Date   Anemia    Anxiety    ARF (acute renal failure) (HCC) 04/2015   Arthritis    Asthma    Cellulitis of right upper extremity    Complication of anesthesia    Heart rate drop during epidural,Respiration,and BP   Coronary artery disease    Depression    Diabetes mellitus    insulin  dependent   Hyperlipidemia LDL goal <70    Hypertension    Obesity    S/P angioplasty with stent 08/2016   DES to mLAD and PTCA only to 2nd diag ostium.    Sleep apnea    Past Surgical History:  Procedure Laterality Date   CARDIAC CATHETERIZATION N/A 09/07/2016   Procedure: Left Heart Cath and Coronary Angiography;  Surgeon: Victory LELON Sharps, MD;  Location: [facility] INVASIVE CV LAB;  Service: Cardiovascular;  Laterality: N/A;   CARDIAC CATHETERIZATION N/A 09/07/2016   Procedure: Coronary Stent Intervention;  Surgeon: Victory LELON Sharps, MD;  Location: [facility] INVASIVE CV LAB;  Service: Cardiovascular;  Laterality: N/A;   CARDIAC CATHETERIZATION N/A 09/07/2016   Procedure: Coronary Balloon Angioplasty;  Surgeon: Victory LELON Sharps, MD;  Location: [facility] INVASIVE CV LAB;  Service: Cardiovascular;  Laterality: N/A;   CARPAL TUNNEL RELEASE Right 04/25/2024   Procedure: RIGHT ENDOSCOPIC CARPAL TUNNEL RELEASE;  Surgeon: Beverley Evalene JONETTA, MD;  Location: [facility] ORS;  Service: Orthopedics;  Laterality: Right;   CATARACT EXTRACTION, BILATERAL     CESAREAN SECTION     x 1   IRRIGATION AND DEBRIDEMENT SHOULDER Right 04/30/2015   Procedure: IRRIGATION AND DEBRIDEMENT SHOULDER;  Surgeon: Evalene JONETTA Beverley, MD;  Location: [facility] OR;  Service: Orthopedics;  Laterality: Right;   IRRIGATION AND DEBRIDEMENT SHOULDER Right 05/01/2015   LEFT HEART CATH AND CORONARY ANGIOGRAPHY N/A 04/25/2020   Procedure: LEFT HEART CATH AND CORONARY  ANGIOGRAPHY;  Surgeon: Sharps Victory LELON, MD;  Location: [facility] INVASIVE CV LAB;  Service: Cardiovascular;  Laterality: N/A;   LEG SURGERY Right    remobal ob abcess   SHOULDER ARTHROSCOPY Right 04/30/2015   Procedure: ARTHROSCOPY SHOULDER;  Surgeon: Evalene JONETTA Beverley, MD;  Location: [facility] OR;  Service: Orthopedics;  Laterality: Right;   TONSILLECTOMY     Patient Active Problem List   Diagnosis Date Noted   Chronic heart failure with preserved ejection fraction (HCC) 03/09/2023   Back pain 08/15/2022   Mood disorder (HCC) 08/15/2022   Pseudophakia of left eye 12/04/2021   Status post cataract extraction and insertion of intraocular lens of right eye 12/04/2021   Elevated liver enzymes 05/23/2020   Spondylosis of lumbar spine 06/22/2019   Hyperlipidemia 06/22/2019   Normocytic anemia 06/22/2019   Vitamin D  deficiency 06/22/2019   Uncontrolled type 2 diabetes mellitus with hyperglycemia, with long-term current use of insulin  (HCC) 08/31/2017   OSA (obstructive sleep apnea) 11/03/2016   Coronary artery disease involving native coronary artery of native heart with angina pectoris (HCC) 09/11/2016   Diabetes mellitus with complication (HCC)    Chest pain of uncertain etiology 09/07/2016   MI, old    DM neuropathy, type II diabetes mellitus (HCC) 06/10/2016   Morbid obesity (HCC) 06/10/2016   Pressure ulcer 05/17/2015   Anaerobic abscess (HCC)    Septic arthritis (HCC)    Essential hypertension 04/30/2015   Cellulitis 04/30/2015   Abscess of right  shoulder     PCP: Celestia Rosaline SQUIBB, NP  REFERRING PROVIDER: Debby Dorn MATSU, MD   REFERRING DIAG: [phone] (ICD-10-CM) - Lumbar spondylosis   Rationale for Evaluation and Treatment: Rehabilitation  THERAPY DIAG:  No diagnosis found.  ONSET DATE: ***  SUBJECTIVE:                                                                                                                                                                                            SUBJECTIVE STATEMENT: ***  PERTINENT HISTORY:  ***  PAIN:  Are you having pain? Yes: NPRS scale: *** Pain location: *** Pain description: *** Aggravating factors: *** Relieving factors: ***  PRECAUTIONS: {Therapy precautions:24002}  RED FLAGS: {PT Red Flags:29287}   WEIGHT BEARING RESTRICTIONS: {Yes ***/No:24003}  FALLS:  Has patient fallen in last 6 months? {fallsyesno:27318}  LIVING ENVIRONMENT: Lives with: {[facility] lives with:25569::lives with their family} Lives in: {Lives in:25570} Stairs: {opstairs:27293} Has following equipment at home: {Assistive devices:23999}  OCCUPATION: ***  PLOF: {PLOF:24004}  PATIENT GOALS: ***  NEXT MD VISIT: ***  OBJECTIVE:  Note: Objective measures were completed at Evaluation unless otherwise noted.  DIAGNOSTIC FINDINGS:     Patient Survey: {rehab surveys:24030}  COGNITION: Overall cognitive status: {cognition:24006}     SENSATION: {sensation:27233}  MUSCLE LENGTH: Hamstrings: Right *** deg; Left *** deg Debby test: Right *** deg; Left *** deg  POSTURE: {posture:25561}  PALPATION: ***  LUMBAR ROM:   AROM eval  Flexion   Extension   Right lateral flexion   Left lateral flexion   Right rotation   Left rotation    (Blank rows = not tested)  LOWER EXTREMITY ROM:     {AROM/PROM:27142}  Right eval Left eval  Hip flexion    Hip extension    Hip abduction    Hip adduction    Hip internal rotation    Hip external rotation    Knee flexion    Knee extension    Ankle dorsiflexion    Ankle plantarflexion    Ankle inversion    Ankle eversion     (Blank rows = not tested)  LOWER EXTREMITY MMT:    MMT Right eval Left eval  Hip flexion    Hip extension    Hip abduction    Hip adduction    Hip internal rotation    Hip external rotation    Knee flexion    Knee extension    Ankle dorsiflexion    Ankle plantarflexion    Ankle inversion    Ankle eversion     (Blank rows = not  tested)  LUMBAR SPECIAL TESTS:  {  lumbar special test:25242}  FUNCTIONAL TESTS:  {Functional tests:24029}  GAIT: Distance walked: *** Assistive device utilized: {Assistive devices:23999} Level of assistance: {Levels of assistance:24026} Comments: ***  TREATMENT DATE:  [facility] Adult PT Treatment:                                                DATE: 08/01/24 Therapeutic Exercise: *** Manual Therapy: *** Neuromuscular re-ed: *** Therapeutic Activity: *** Modalities: *** Self Care: ***                                                                                                                                  PATIENT EDUCATION:  Education details: *** Person educated: {Person educated:25204} Education method: {Education Method:25205} Education comprehension: {Education Comprehension:25206}  HOME EXERCISE PROGRAM: ***  ASSESSMENT:  CLINICAL IMPRESSION: Patient is a 43 y.o. female who was seen today for physical therapy evaluation and treatment for M47.816 (ICD-10-CM) - Lumbar spondylosis .   OBJECTIVE IMPAIRMENTS: {opptimpairments:25111}.   ACTIVITY LIMITATIONS: {activitylimitations:27494}  PARTICIPATION LIMITATIONS: {participationrestrictions:25113}  PERSONAL FACTORS: {Personal factors:25162} are also affecting patient's functional outcome.   REHAB POTENTIAL: {rehabpotential:25112}  CLINICAL DECISION MAKING: {clinical decision making:25114}  EVALUATION COMPLEXITY: {Evaluation complexity:25115}   GOALS:  SHORT TERM GOALS: Target date: ***  *** Baseline: Goal status: INITIAL  2.  *** Baseline:  Goal status: INITIAL  3.  *** Baseline:  Goal status: INITIAL  4.  *** Baseline:  Goal status: INITIAL  5.  *** Baseline:  Goal status: INITIAL  6.  *** Baseline:  Goal status: INITIAL  LONG TERM GOALS: Target date: ***  *** Baseline:  Goal status: INITIAL  2.  *** Baseline:  Goal status: INITIAL  3.  *** Baseline:  Goal status:  INITIAL  4.  *** Baseline:  Goal status: INITIAL  5.  *** Baseline:  Goal status: INITIAL  6.  *** Baseline:  Goal status: INITIAL  PLAN:  PT FREQUENCY: {rehab frequency:25116}  PT DURATION: {rehab duration:25117}  PLANNED INTERVENTIONS: {rehab planned interventions:25118::97110-Therapeutic exercises,97530- Therapeutic [id number]- Neuromuscular re-education,97535- Self Rjmz,02859- Manual therapy}.  PLAN FOR NEXT SESSION: ***   Dasie Daft, PT 08/01/2024, 6:17 AM

## 2024-08-03 ENCOUNTER — Encounter (INDEPENDENT_AMBULATORY_CARE_PROVIDER_SITE_OTHER): Payer: Self-pay | Admitting: Primary Care

## 2024-08-03 ENCOUNTER — Ambulatory Visit (INDEPENDENT_AMBULATORY_CARE_PROVIDER_SITE_OTHER): Admitting: Primary Care

## 2024-08-03 VITALS — BP 119/77 | HR 72 | Resp 19 | Ht 67.5 in | Wt 373.0 lb

## 2024-08-03 DIAGNOSIS — I10 Essential (primary) hypertension: Secondary | ICD-10-CM | POA: Diagnosis not present

## 2024-08-03 DIAGNOSIS — R109 Unspecified abdominal pain: Secondary | ICD-10-CM | POA: Diagnosis not present

## 2024-08-03 DIAGNOSIS — M62838 Other muscle spasm: Secondary | ICD-10-CM

## 2024-08-03 DIAGNOSIS — E1165 Type 2 diabetes mellitus with hyperglycemia: Secondary | ICD-10-CM | POA: Diagnosis not present

## 2024-08-03 DIAGNOSIS — M546 Pain in thoracic spine: Secondary | ICD-10-CM | POA: Diagnosis not present

## 2024-08-03 DIAGNOSIS — M47816 Spondylosis without myelopathy or radiculopathy, lumbar region: Secondary | ICD-10-CM | POA: Diagnosis not present

## 2024-08-03 DIAGNOSIS — E119 Type 2 diabetes mellitus without complications: Secondary | ICD-10-CM | POA: Diagnosis not present

## 2024-08-03 DIAGNOSIS — G5601 Carpal tunnel syndrome, right upper limb: Secondary | ICD-10-CM | POA: Diagnosis not present

## 2024-08-03 DIAGNOSIS — E790 Hyperuricemia without signs of inflammatory arthritis and tophaceous disease: Secondary | ICD-10-CM | POA: Diagnosis not present

## 2024-08-03 DIAGNOSIS — R030 Elevated blood-pressure reading, without diagnosis of hypertension: Secondary | ICD-10-CM | POA: Diagnosis not present

## 2024-08-03 DIAGNOSIS — Z6841 Body Mass Index (BMI) 40.0 and over, adult: Secondary | ICD-10-CM | POA: Diagnosis not present

## 2024-08-03 DIAGNOSIS — M25511 Pain in right shoulder: Secondary | ICD-10-CM | POA: Diagnosis not present

## 2024-08-03 DIAGNOSIS — Z3200 Encounter for pregnancy test, result unknown: Secondary | ICD-10-CM | POA: Diagnosis not present

## 2024-08-03 DIAGNOSIS — Z794 Long term (current) use of insulin: Secondary | ICD-10-CM

## 2024-08-03 LAB — POCT URINALYSIS DIP (CLINITEK)
Bilirubin, UA: NEGATIVE
Blood, UA: NEGATIVE
Glucose, UA: >=1000 mg/dL — AB
Ketones, POC UA: NEGATIVE mg/dL
Leukocytes, UA: NEGATIVE
Nitrite, UA: NEGATIVE
POC PROTEIN,UA: 30 — AB
Spec Grav, UA: 1.015 (ref 1.010–1.025)
Urobilinogen, UA: 1 U/dL
pH, UA: 6 (ref 5.0–8.0)

## 2024-08-03 MED ORDER — TIZANIDINE HCL 4 MG PO TABS: 4.0000 mg | ORAL_TABLET | Freq: Four times a day (QID) | ORAL | 1 refills | Status: DC | PRN

## 2024-08-03 NOTE — Progress Notes (Unsigned)
 [facility]  Lindsay [name], is a 43 y.o. female  RDW:[id number]  FMW:[id number]  DOB - [birth year]  Chief Complaint  Patient presents with  . Flank Pain    Left side pain   . Medical Management of Chronic Issues       Subjective:   Lindsay [name] is a 43 y.o. female here today for an acute visit. Muscle spasm on left side with pain 2 days and unable to contribute tHE SOURCE.   HPI  No problems updated.  Comprehensive ROS Pertinent positive and negative noted in HPI   Allergies  Allergen Reactions  . Acetaminophen  Itching and Swelling    Itching of the mouth, swelling of tongue and stomach started hurting mild  . Hydrazine Yellow [Fd&C Yellow #5 (Tartrazine)] Shortness Of Breath and Swelling    Swelling mostly noticed in legs and feet, retaining urination, shortness of breaht, and minor chest pain  . Lisinopril  Shortness Of Breath    Was on prinzide ; had sob/chest pain on it.  . Pineapple Itching    Past Medical History:  Diagnosis Date  . Anemia   . Anxiety   . ARF (acute renal failure) (HCC) 04/2015  . Arthritis   . Asthma   . Cellulitis of right upper extremity   . Complication of anesthesia    Heart rate drop during epidural,Respiration,and BP  . Coronary artery disease   . Depression   . Diabetes mellitus    insulin  dependent  . Hyperlipidemia LDL goal <70   . Hypertension   . Obesity   . S/P angioplasty with stent 08/2016   DES to mLAD and PTCA only to 2nd diag ostium.   . Sleep apnea     Current Outpatient Medications on File Prior to Visit  Medication Sig Dispense Refill  . albuterol  (VENTOLIN  HFA) 108 (90 Base) MCG/ACT inhaler Inhale 2 puffs into the lungs every 6 (six) hours as needed for wheezing or shortness of breath. 18 g 1  . aspirin  81 MG tablet Take 1 tablet (81 mg total) by mouth daily. 30 tablet 11  . atorvastatin  (LIPITOR ) 40 MG tablet Take 1 tablet (40 mg total) by mouth daily. 90 tablet 1  . DULoxetine  (CYMBALTA )  30 MG capsule Take 1 capsule (30 mg total) by mouth 2 (two) times daily. 180 capsule 1  . famotidine  (PEPCID ) 20 MG tablet Take 1 tablet (20 mg total) by mouth 2 (two) times daily. 14 tablet 0  . gabapentin  (NEURONTIN ) 300 MG capsule Take 2 capsules (600 mg total) by mouth 3 (three) times daily. 180 capsule 2  . hydrALAZINE  (APRESOLINE ) 100 MG tablet Take 1 tablet (100 mg total) by mouth 3 (three) times daily. 180 tablet 1  . isosorbide  mononitrate (IMDUR ) 60 MG 24 hr tablet Take 1 tablet (60 mg total) by mouth daily. 90 tablet 3  . losartan  (COZAAR ) 100 MG tablet TAKE 1 TABLET(100 MG) BY MOUTH DAILY 90 tablet 3  . MOUNJARO 5 MG/0.5ML Pen Inject 5 mg into the skin once a week.    . nitroGLYCERIN  (NITROSTAT ) 0.4 MG SL tablet Place 1 tablet (0.4 mg total) under the tongue every 5 (five) minutes as needed for chest pain. Please keep upcoming appt in October 2022. Final Attempt 25 tablet 2  . ondansetron  (ZOFRAN -ODT) 4 MG disintegrating tablet Take 1 tablet (4 mg total) by mouth every 8 (eight) hours as needed for nausea or vomiting. 10 tablet 0  . tiZANidine  (ZANAFLEX ) 4 MG tablet Take 1 tablet (4 mg  total) by mouth every 6 (six) hours as needed for muscle spasms. 90 tablet 1  . torsemide  (DEMADEX ) 20 MG tablet Take 2 tablets (40 mg total) by mouth 2 (two) times daily. 360 tablet 3  . allopurinol (ZYLOPRIM) 100 MG tablet Take 100 mg by mouth daily. (Patient not taking: Reported on 08/03/2024)    . colchicine 0.6 MG tablet Take 0.6 mg by mouth daily as needed (Gout). (Patient not taking: Reported on 08/03/2024)    . pantoprazole  (PROTONIX ) 20 MG tablet Take 1 tablet (20 mg total) by mouth daily. (Patient not taking: Reported on 08/03/2024) 21 tablet 0  . sucralfate  (CARAFATE ) 1 g tablet Take 1 tablet (1 g total) by mouth 4 (four) times daily -  with meals and at bedtime. (Patient not taking: Reported on 08/03/2024) 60 tablet 1   No current facility-administered medications on file prior to visit.   Health  Maintenance  Topic Date Due  . Yearly kidney health urinalysis for diabetes  Never done  . Hepatitis B Vaccine (1 of 3 - 19+ 3-dose series) 09/11/2000  . HPV Vaccine (1 - 3-dose SCDM series) Never done  . COVID-19 Vaccine (2 - Pfizer risk series) 12/21/2020  . Flu Shot  07/21/2024  . Pneumococcal Vaccine (2 of 2 - PCV) 03/15/2025*  . Eye exam for diabetics  11/23/2024  . Complete foot exam   03/08/2025  . Yearly kidney function blood test for diabetes  06/12/2025  . Pap with HPV screening  06/03/2026  . DTaP/Tdap/Td vaccine (2 - Td or Tdap) 07/28/2026  . Hepatitis C Screening  Completed  . HIV Screening  Completed  . Meningitis B Vaccine  Aged Out  . Hemoglobin A1C  Discontinued  *Topic was postponed. The date shown is not the original due date.    Objective:   Vitals:   08/03/24 1212  Resp: 19  SpO2: 100%  Weight: (!) 373 lb (169.2 kg)  Height: 5' 7.5 (1.715 m)   {Vitals History (Optional):23777}  Physical Exam  {Labs (Optional):23779}  Assessment & Plan   Uncontrolled type 2 diabetes mellitus with hyperglycemia, with long-term current use of insulin  [facility])     Patient have been counseled extensively about nutrition and exercise. Other issues discussed during this visit include: low cholesterol diet, weight control and daily exercise, foot care, annual eye examinations at Ophthalmology, importance of adherence with medications and regular follow-up. We also discussed long term complications of uncontrolled diabetes and hypertension.   No follow-ups on file.  The patient was given clear instructions to go to ER or return to medical center if symptoms don't improve, worsen or new problems develop. The patient verbalized understanding. The patient was told to call to get lab results if they haven't heard anything in the next week.   This note has been created with Education officer, environmental. Any transcriptional errors are unintentional.    Lindsay SHAUNNA Bohr, NP 08/03/2024, 12:34 PM

## 2024-08-07 ENCOUNTER — Telehealth (INDEPENDENT_AMBULATORY_CARE_PROVIDER_SITE_OTHER): Payer: Self-pay | Admitting: Primary Care

## 2024-08-07 NOTE — Telephone Encounter (Signed)
 Called pt to schedule with [facility]. Pt did not answer and LVM.

## 2024-08-08 DIAGNOSIS — Z79899 Other long term (current) drug therapy: Secondary | ICD-10-CM | POA: Diagnosis not present

## 2024-08-11 ENCOUNTER — Other Ambulatory Visit (INDEPENDENT_AMBULATORY_CARE_PROVIDER_SITE_OTHER): Payer: Self-pay | Admitting: *Deleted

## 2024-08-11 DIAGNOSIS — I10 Essential (primary) hypertension: Secondary | ICD-10-CM

## 2024-08-11 DIAGNOSIS — E118 Type 2 diabetes mellitus with unspecified complications: Secondary | ICD-10-CM

## 2024-08-16 ENCOUNTER — Other Ambulatory Visit (INDEPENDENT_AMBULATORY_CARE_PROVIDER_SITE_OTHER): Payer: Self-pay | Admitting: *Deleted

## 2024-08-16 DIAGNOSIS — M25511 Pain in right shoulder: Secondary | ICD-10-CM | POA: Diagnosis not present

## 2024-08-16 DIAGNOSIS — G4733 Obstructive sleep apnea (adult) (pediatric): Secondary | ICD-10-CM | POA: Diagnosis not present

## 2024-08-16 DIAGNOSIS — G5601 Carpal tunnel syndrome, right upper limb: Secondary | ICD-10-CM | POA: Diagnosis not present

## 2024-08-16 DIAGNOSIS — G8929 Other chronic pain: Secondary | ICD-10-CM | POA: Diagnosis not present

## 2024-08-16 DIAGNOSIS — M47816 Spondylosis without myelopathy or radiculopathy, lumbar region: Secondary | ICD-10-CM | POA: Diagnosis not present

## 2024-08-16 DIAGNOSIS — I10 Essential (primary) hypertension: Secondary | ICD-10-CM | POA: Diagnosis not present

## 2024-08-16 DIAGNOSIS — E118 Type 2 diabetes mellitus with unspecified complications: Secondary | ICD-10-CM

## 2024-08-16 DIAGNOSIS — E119 Type 2 diabetes mellitus without complications: Secondary | ICD-10-CM | POA: Diagnosis not present

## 2024-08-16 DIAGNOSIS — M546 Pain in thoracic spine: Secondary | ICD-10-CM | POA: Diagnosis not present

## 2024-08-16 DIAGNOSIS — Z3200 Encounter for pregnancy test, result unknown: Secondary | ICD-10-CM | POA: Diagnosis not present

## 2024-08-16 DIAGNOSIS — Z6841 Body Mass Index (BMI) 40.0 and over, adult: Secondary | ICD-10-CM | POA: Diagnosis not present

## 2024-08-31 ENCOUNTER — Other Ambulatory Visit (INDEPENDENT_AMBULATORY_CARE_PROVIDER_SITE_OTHER): Payer: Self-pay | Admitting: Primary Care

## 2024-08-31 DIAGNOSIS — E118 Type 2 diabetes mellitus with unspecified complications: Secondary | ICD-10-CM

## 2024-08-31 NOTE — Telephone Encounter (Unsigned)
 Copied from CRM [id number]. Topic: Clinical - Medication Refill >> Aug 31, 2024 10:31 AM Tiffini S wrote: Medication: gabapentin  (NEURONTIN ) 300 MG capsule  Has the patient contacted their pharmacy? Yes, pharmacy faxed request on 08/28/24- fax have been sent several times (Agent: If no, request that the patient contact the pharmacy for the refill. If patient does not wish to contact the pharmacy document the reason why and proceed with request.) (Agent: If yes, when and what did the pharmacy advise?)  This is the patient's preferred pharmacy:  [facility] [phone] - , [place] - [address] AT [facility] [address]  [place] [address] Phone: [phone] Fax: [phone]  Is this the correct pharmacy for this prescription? Yes If no, delete pharmacy and type the correct one.   Has the prescription been filled recently? Yes  Is the patient out of the medication? Yes, have been out for a week   Has the patient been seen for an appointment in the last year OR does the patient have an upcoming appointment? Yes  Can we respond through MyChart? No, please call patient at [phone]  Agent: Please be advised that Rx refills may take up to 3 business days. We ask that you follow-up with your pharmacy.

## 2024-09-01 MED ORDER — GABAPENTIN 300 MG PO CAPS: 600.0000 mg | ORAL_CAPSULE | Freq: Three times a day (TID) | ORAL | 2 refills | Status: DC

## 2024-09-01 NOTE — Telephone Encounter (Signed)
 Requested Prescriptions  Pending Prescriptions Disp Refills   gabapentin  (NEURONTIN ) 300 MG capsule 180 capsule 2    Sig: Take 2 capsules (600 mg total) by mouth 3 (three) times daily.     Neurology: Anticonvulsants - gabapentin  Passed - 09/01/2024 10:12 AM      Passed - Cr in normal range and within 360 days    Creat  Date Value Ref Range Status  09/16/2016 0.71 0.50 - 1.10 mg/dL Final   Creatinine, Ser  Date Value Ref Range Status  06/12/2024 0.90 0.44 - 1.00 mg/dL Final   Creatinine,U  Date Value Ref Range Status  06/11/2011 44.6 mg/dL Final    Comment:    (NOTE) Cutoff Values for Urine Drug Screen:        Drug Class           Cutoff (ng/mL)        Amphetamines            1000        Barbiturates             200        Cocaine Metabolites      300        Benzodiazepines          200        Methadone                300        Opiates                 2000        Phencyclidine             25        Propoxyphene             300        Marijuana Metabolites     50 For medical purposes only.   Creatinine, Urine  Date Value Ref Range Status  07/14/2021 193.74 mg/dL Final    Comment:    Performed at [facility], [address], [place], [place] [address]         Passed - Completed PHQ-2 or PHQ-9 in the last 360 days      Passed - Valid encounter within last 12 months    Recent Outpatient Visits           4 weeks ago Uncontrolled type 2 diabetes mellitus with hyperglycemia, with long-term current use of insulin  [facility])   [place] [facility] Celestia Rosaline SQUIBB, NP   4 months ago Anxiety and depression   [place] [facility] Celestia Rosaline SQUIBB, NP   5 months ago Uncomplicated asthma, unspecified asthma severity, unspecified whether persistent   [place] [facility] Celestia Rosaline SQUIBB, NP   11 months ago Muscle spasm   [place] [facility] Celestia Rosaline SQUIBB, NP   1 year ago Diabetes  mellitus with complication [facility])   [place] [facility] Celestia Rosaline SQUIBB, NP

## 2024-09-08 ENCOUNTER — Other Ambulatory Visit (INDEPENDENT_AMBULATORY_CARE_PROVIDER_SITE_OTHER): Payer: Self-pay | Admitting: *Deleted

## 2024-09-08 DIAGNOSIS — E118 Type 2 diabetes mellitus with unspecified complications: Secondary | ICD-10-CM

## 2024-09-08 DIAGNOSIS — M62838 Other muscle spasm: Secondary | ICD-10-CM

## 2024-09-08 MED ORDER — TIZANIDINE HCL 4 MG PO TABS: 4.0000 mg | ORAL_TABLET | Freq: Four times a day (QID) | ORAL | 1 refills | Status: AC | PRN

## 2024-09-08 NOTE — Addendum Note (Signed)
 Addended by: Kenneith Stief on: 09/08/2024 12:29 PM   Modules accepted: Orders

## 2024-09-11 DIAGNOSIS — E119 Type 2 diabetes mellitus without complications: Secondary | ICD-10-CM | POA: Diagnosis not present

## 2024-09-11 DIAGNOSIS — Z6841 Body Mass Index (BMI) 40.0 and over, adult: Secondary | ICD-10-CM | POA: Diagnosis not present

## 2024-09-11 DIAGNOSIS — I10 Essential (primary) hypertension: Secondary | ICD-10-CM | POA: Diagnosis not present

## 2024-09-11 DIAGNOSIS — G4733 Obstructive sleep apnea (adult) (pediatric): Secondary | ICD-10-CM | POA: Diagnosis not present

## 2024-09-11 DIAGNOSIS — J45909 Unspecified asthma, uncomplicated: Secondary | ICD-10-CM | POA: Diagnosis not present

## 2024-09-11 DIAGNOSIS — R030 Elevated blood-pressure reading, without diagnosis of hypertension: Secondary | ICD-10-CM | POA: Diagnosis not present

## 2024-09-12 DIAGNOSIS — G4733 Obstructive sleep apnea (adult) (pediatric): Secondary | ICD-10-CM | POA: Diagnosis not present

## 2024-09-12 DIAGNOSIS — M25511 Pain in right shoulder: Secondary | ICD-10-CM | POA: Diagnosis not present

## 2024-09-12 DIAGNOSIS — M47816 Spondylosis without myelopathy or radiculopathy, lumbar region: Secondary | ICD-10-CM | POA: Diagnosis not present

## 2024-09-12 DIAGNOSIS — I10 Essential (primary) hypertension: Secondary | ICD-10-CM | POA: Diagnosis not present

## 2024-09-12 DIAGNOSIS — E790 Hyperuricemia without signs of inflammatory arthritis and tophaceous disease: Secondary | ICD-10-CM | POA: Diagnosis not present

## 2024-09-12 DIAGNOSIS — G5601 Carpal tunnel syndrome, right upper limb: Secondary | ICD-10-CM | POA: Diagnosis not present

## 2024-09-12 DIAGNOSIS — M546 Pain in thoracic spine: Secondary | ICD-10-CM | POA: Diagnosis not present

## 2024-09-12 DIAGNOSIS — K2970 Gastritis, unspecified, without bleeding: Secondary | ICD-10-CM | POA: Diagnosis not present

## 2024-09-12 DIAGNOSIS — Z3200 Encounter for pregnancy test, result unknown: Secondary | ICD-10-CM | POA: Diagnosis not present

## 2024-09-12 DIAGNOSIS — E119 Type 2 diabetes mellitus without complications: Secondary | ICD-10-CM | POA: Diagnosis not present

## 2024-09-12 DIAGNOSIS — Z6841 Body Mass Index (BMI) 40.0 and over, adult: Secondary | ICD-10-CM | POA: Diagnosis not present

## 2024-10-10 ENCOUNTER — Ambulatory Visit: Admitting: Podiatry

## 2024-10-10 DIAGNOSIS — Z91198 Patient's noncompliance with other medical treatment and regimen for other reason: Secondary | ICD-10-CM

## 2024-10-11 ENCOUNTER — Other Ambulatory Visit (INDEPENDENT_AMBULATORY_CARE_PROVIDER_SITE_OTHER): Payer: Self-pay | Admitting: Primary Care

## 2024-10-11 DIAGNOSIS — M546 Pain in thoracic spine: Secondary | ICD-10-CM | POA: Diagnosis not present

## 2024-10-11 DIAGNOSIS — Z3200 Encounter for pregnancy test, result unknown: Secondary | ICD-10-CM | POA: Diagnosis not present

## 2024-10-11 DIAGNOSIS — E1169 Type 2 diabetes mellitus with other specified complication: Secondary | ICD-10-CM | POA: Diagnosis not present

## 2024-10-11 DIAGNOSIS — Z794 Long term (current) use of insulin: Secondary | ICD-10-CM | POA: Diagnosis not present

## 2024-10-11 DIAGNOSIS — M47816 Spondylosis without myelopathy or radiculopathy, lumbar region: Secondary | ICD-10-CM | POA: Diagnosis not present

## 2024-10-11 DIAGNOSIS — I10 Essential (primary) hypertension: Secondary | ICD-10-CM | POA: Diagnosis not present

## 2024-10-11 DIAGNOSIS — M25511 Pain in right shoulder: Secondary | ICD-10-CM | POA: Diagnosis not present

## 2024-10-11 DIAGNOSIS — G4733 Obstructive sleep apnea (adult) (pediatric): Secondary | ICD-10-CM | POA: Diagnosis not present

## 2024-10-11 DIAGNOSIS — G5601 Carpal tunnel syndrome, right upper limb: Secondary | ICD-10-CM | POA: Diagnosis not present

## 2024-10-11 DIAGNOSIS — Z6841 Body Mass Index (BMI) 40.0 and over, adult: Secondary | ICD-10-CM | POA: Diagnosis not present

## 2024-10-13 DIAGNOSIS — M47816 Spondylosis without myelopathy or radiculopathy, lumbar region: Secondary | ICD-10-CM | POA: Diagnosis not present

## 2024-10-13 DIAGNOSIS — K2970 Gastritis, unspecified, without bleeding: Secondary | ICD-10-CM | POA: Diagnosis not present

## 2024-10-13 DIAGNOSIS — G5601 Carpal tunnel syndrome, right upper limb: Secondary | ICD-10-CM | POA: Diagnosis not present

## 2024-10-13 DIAGNOSIS — M25511 Pain in right shoulder: Secondary | ICD-10-CM | POA: Diagnosis not present

## 2024-10-13 DIAGNOSIS — Z6841 Body Mass Index (BMI) 40.0 and over, adult: Secondary | ICD-10-CM | POA: Diagnosis not present

## 2024-10-13 DIAGNOSIS — I10 Essential (primary) hypertension: Secondary | ICD-10-CM | POA: Diagnosis not present

## 2024-10-13 DIAGNOSIS — G4733 Obstructive sleep apnea (adult) (pediatric): Secondary | ICD-10-CM | POA: Diagnosis not present

## 2024-10-13 DIAGNOSIS — M546 Pain in thoracic spine: Secondary | ICD-10-CM | POA: Diagnosis not present

## 2024-10-13 DIAGNOSIS — E790 Hyperuricemia without signs of inflammatory arthritis and tophaceous disease: Secondary | ICD-10-CM | POA: Diagnosis not present

## 2024-10-13 DIAGNOSIS — B353 Tinea pedis: Secondary | ICD-10-CM | POA: Diagnosis not present

## 2024-10-13 DIAGNOSIS — E119 Type 2 diabetes mellitus without complications: Secondary | ICD-10-CM | POA: Diagnosis not present

## 2024-10-15 NOTE — Progress Notes (Signed)
 Failure to attend appointment with reason given Appointment rescheduled by patient.

## 2024-10-17 DIAGNOSIS — Z79899 Other long term (current) drug therapy: Secondary | ICD-10-CM | POA: Diagnosis not present

## 2024-10-22 ENCOUNTER — Emergency Department (HOSPITAL_COMMUNITY)
Admission: EM | Admit: 2024-10-22 | Discharge: 2024-10-22 | Disposition: A | Discharge status: Home / Self Care | Attending: Emergency Medicine | Admitting: Emergency Medicine

## 2024-10-22 ENCOUNTER — Encounter (HOSPITAL_COMMUNITY): Payer: Self-pay | Admitting: *Deleted

## 2024-10-22 ENCOUNTER — Emergency Department (HOSPITAL_COMMUNITY)

## 2024-10-22 DIAGNOSIS — R0602 Shortness of breath: Secondary | ICD-10-CM | POA: Diagnosis not present

## 2024-10-22 DIAGNOSIS — E119 Type 2 diabetes mellitus without complications: Secondary | ICD-10-CM | POA: Diagnosis not present

## 2024-10-22 DIAGNOSIS — R2243 Localized swelling, mass and lump, lower limb, bilateral: Secondary | ICD-10-CM | POA: Insufficient documentation

## 2024-10-22 DIAGNOSIS — I2510 Atherosclerotic heart disease of native coronary artery without angina pectoris: Secondary | ICD-10-CM | POA: Insufficient documentation

## 2024-10-22 DIAGNOSIS — R079 Chest pain, unspecified: Secondary | ICD-10-CM | POA: Insufficient documentation

## 2024-10-22 DIAGNOSIS — Z7982 Long term (current) use of aspirin: Secondary | ICD-10-CM | POA: Insufficient documentation

## 2024-10-22 DIAGNOSIS — R0789 Other chest pain: Secondary | ICD-10-CM | POA: Diagnosis not present

## 2024-10-22 DIAGNOSIS — I10 Essential (primary) hypertension: Secondary | ICD-10-CM | POA: Diagnosis not present

## 2024-10-22 LAB — COMPREHENSIVE METABOLIC PANEL WITH GFR
ALT: 18 U/L (ref 0–44)
AST: 22 U/L (ref 15–41)
Albumin: 3.1 g/dL — ABNORMAL LOW (ref 3.5–5.0)
Alkaline Phosphatase: 89 U/L (ref 38–126)
Anion gap: 12 (ref 5–15)
BUN: 24 mg/dL — ABNORMAL HIGH (ref 6–20)
CO2: 27 mmol/L (ref 22–32)
Calcium: 8.7 mg/dL — ABNORMAL LOW (ref 8.9–10.3)
Chloride: 98 mmol/L (ref 98–111)
Creatinine, Ser: 1.31 mg/dL — ABNORMAL HIGH (ref 0.44–1.00)
GFR, Estimated: 52 mL/min — ABNORMAL LOW (ref >60)
Glucose, Bld: 199 mg/dL — ABNORMAL HIGH (ref 70–99)
Potassium: 4.7 mmol/L (ref 3.5–5.1)
Sodium: 137 mmol/L (ref 135–145)
Total Bilirubin: 0.9 mg/dL (ref 0.0–1.2)
Total Protein: 7.2 g/dL (ref 6.5–8.1)

## 2024-10-22 LAB — CBC
HCT: 33.7 % — ABNORMAL LOW (ref 36.0–46.0)
Hemoglobin: 10.2 g/dL — ABNORMAL LOW (ref 12.0–15.0)
MCH: 25.8 pg — ABNORMAL LOW (ref 26.0–34.0)
MCHC: 30.3 g/dL (ref 30.0–36.0)
MCV: 85.1 fL (ref 80.0–100.0)
Platelets: 246 K/uL (ref 150–400)
RBC: 3.96 MIL/uL (ref 3.87–5.11)
RDW: 15 % (ref 11.5–15.5)
WBC: 8.4 K/uL (ref 4.0–10.5)
nRBC: 0 % (ref 0.0–0.2)

## 2024-10-22 LAB — RESP PANEL BY RT-PCR (RSV, FLU A&B, COVID)  RVPGX2
Influenza A by PCR: NEGATIVE
Influenza B by PCR: NEGATIVE
Resp Syncytial Virus by PCR: NEGATIVE
SARS Coronavirus 2 by RT PCR: NEGATIVE

## 2024-10-22 LAB — LIPASE, BLOOD: Lipase: 21 U/L (ref 11–51)

## 2024-10-22 LAB — TROPONIN I (HIGH SENSITIVITY)
Troponin I (High Sensitivity): 6 ng/L (ref <18)
Troponin I (High Sensitivity): 6 ng/L (ref <18)

## 2024-10-22 LAB — CBG MONITORING, ED: Glucose-Capillary: 195 mg/dL — ABNORMAL HIGH (ref 70–99)

## 2024-10-22 LAB — HCG, SERUM, QUALITATIVE: Preg, Serum: NEGATIVE

## 2024-10-22 MED ORDER — ONDANSETRON 4 MG PO TBDP
8.0000 mg | ORAL_TABLET | Freq: Once | ORAL | Status: AC
  Administered 2024-10-22: 8 mg via ORAL
  Filled 2024-10-22: qty 2

## 2024-10-22 MED ORDER — ONDANSETRON HCL 4 MG PO TABS: 4.0000 mg | ORAL_TABLET | Freq: Four times a day (QID) | ORAL | 0 refills | Status: AC

## 2024-10-22 MED ORDER — ACETAMINOPHEN 325 MG PO TABS
650.0000 mg | ORAL_TABLET | Freq: Once | ORAL | Status: AC
Start: 2024-10-22 — End: 2024-10-22
  Administered 2024-10-22: 650 mg via ORAL
  Filled 2024-10-22: qty 2

## 2024-10-22 NOTE — ED Triage Notes (Signed)
 Patient states that she is shortness of breath and nausea. She also states that her blood sugar dropped last night.

## 2024-10-22 NOTE — ED Provider Triage Note (Signed)
 Emergency Medicine Provider Triage Evaluation Note  PINKEY MCJUNKIN , a 43 y.o. female  was evaluated in triage.  Pt complains of generalized weakness, body aches, cough, NV, abdominal pain.  Review of Systems  Positive: Body aches, Chills Negative: Fever  Physical Exam  BP (!) 148/71   Pulse 77   Temp 98.5 F (36.9 C)   Resp 16   Ht 5' 7 (1.702 m)   Wt (!) 169.2 kg   SpO2 98%   BMI 58.42 kg/m  Gen:   Awake, no distress   Resp:  Normal effort  MSK:   Moves extremities without difficulty  Other:    Medical Decision Making  Medically screening exam initiated at 3:14 PM.  Appropriate orders placed.  Debrah D Mcree was informed that the remainder of the evaluation will be completed by another provider, this initial triage assessment does not replace that evaluation, and the importance of remaining in the ED until their evaluation is complete.     Shermon Warren SAILOR, PA-C 10/22/24 1515

## 2024-10-22 NOTE — ED Provider Notes (Signed)
 [place] EMERGENCY DEPARTMENT AT [facility] Provider Note   CSN: [id number] Arrival date & time: 10/22/24  1454     Patient presents with: Chest Pain   Lindsay [name] is a 43 y.o. female.    Chest Pain    Patient has a history of hypertension, elevated BMI, renal failure, diabetes, coronary artery disease, hyperlipidemia.  Patient states she has been having trouble with shortness of breath, some chest discomfort, some decreased urinary output.  Triage note mentions cough body aches abdominal pain.  Patient denies any significant cough to me.  She states she is not having any abdominal pain.  She denies any increased leg swelling.  Patient has not noticed any weight gain.  Prior to Admission medications   Medication Sig Start Date End Date Taking? Authorizing Provider  albuterol  (VENTOLIN  HFA) 108 (90 Base) MCG/ACT inhaler Inhale 2 puffs into the lungs every 6 (six) hours as needed for wheezing or shortness of breath. 03/15/24   Celestia Rosaline SQUIBB, NP  allopurinol (ZYLOPRIM) 100 MG tablet Take 100 mg by mouth daily. Patient not taking: Reported on 08/03/2024    [provider]  aspirin  81 MG tablet Take 1 tablet (81 mg total) by mouth daily. 01/13/18   Kristy Sharolyn Lenis, PA-C  atorvastatin  (LIPITOR ) 40 MG tablet Take 1 tablet (40 mg total) by mouth daily. 03/17/24   Celestia Rosaline SQUIBB, NP  colchicine 0.6 MG tablet Take 0.6 mg by mouth daily as needed (Gout). Patient not taking: Reported on 08/03/2024 03/20/24   [provider]  DULoxetine  (CYMBALTA ) 30 MG capsule Take 1 capsule (30 mg total) by mouth 2 (two) times daily. 05/03/24   Celestia Rosaline SQUIBB, NP  famotidine  (PEPCID ) 20 MG tablet Take 1 tablet (20 mg total) by mouth 2 (two) times daily. 06/13/24   Geiple, Joshua, PA-C  gabapentin  (NEURONTIN ) 300 MG capsule Take 2 capsules (600 mg total) by mouth 3 (three) times daily. 09/01/24   Celestia Rosaline SQUIBB, NP  hydrALAZINE  (APRESOLINE ) 100 MG tablet TAKE 1  TABLET(100 MG) BY MOUTH THREE TIMES DAILY 10/11/24   Celestia Rosaline SQUIBB, NP  isosorbide  mononitrate (IMDUR ) 60 MG 24 hr tablet Take 1 tablet (60 mg total) by mouth daily. 03/09/23   Chandrasekhar, Mahesh A, MD  losartan  (COZAAR ) 100 MG tablet TAKE 1 TABLET(100 MG) BY MOUTH DAILY 05/19/24   Chandrasekhar, Mahesh A, MD  MOUNJARO 5 MG/0.5ML Pen Inject 5 mg into the skin once a week. 12/20/23   [provider]  nitroGLYCERIN  (NITROSTAT ) 0.4 MG SL tablet Place 1 tablet (0.4 mg total) under the tongue every 5 (five) minutes as needed for chest pain. Please keep upcoming appt in October 2022. Final Attempt 05/03/24   Celestia Rosaline SQUIBB, NP  ondansetron  (ZOFRAN -ODT) 4 MG disintegrating tablet Take 1 tablet (4 mg total) by mouth every 8 (eight) hours as needed for nausea or vomiting. 06/13/24   Desiderio Chew, PA-C  pantoprazole  (PROTONIX ) 20 MG tablet Take 1 tablet (20 mg total) by mouth daily. Patient not taking: Reported on 08/03/2024 06/13/24   Desiderio Chew, PA-C  sucralfate  (CARAFATE ) 1 g tablet Take 1 tablet (1 g total) by mouth 4 (four) times daily -  with meals and at bedtime. Patient not taking: Reported on 08/03/2024 06/13/24   Desiderio Chew, PA-C  tiZANidine  (ZANAFLEX ) 4 MG tablet Take 1 tablet (4 mg total) by mouth every 6 (six) hours as needed for muscle spasms. 09/08/24   Celestia Rosaline SQUIBB, NP  torsemide  (DEMADEX ) 20 MG  tablet Take 2 tablets (40 mg total) by mouth 2 (two) times daily. 03/15/24   Celestia Rosaline SQUIBB, NP    Allergies: Hydrazine yellow [fd&c yellow #5 (tartrazine)], Lisinopril , and Pineapple    Review of Systems  Cardiovascular:  Positive for chest pain.    Updated Vital Signs BP 128/62   Pulse 80   Temp 99.3 F (37.4 C) (Oral)   Resp 13   Ht 1.702 m (5' 7)   Wt (!) 169.2 kg   SpO2 100%   BMI 58.42 kg/m   Physical Exam Vitals and nursing note reviewed.  Constitutional:      Appearance: She is well-developed. She is not diaphoretic.  HENT:     Head:  Normocephalic and atraumatic.     Right Ear: External ear normal.     Left Ear: External ear normal.  Eyes:     General: No scleral icterus.       Right eye: No discharge.        Left eye: No discharge.     Conjunctiva/sclera: Conjunctivae normal.  Neck:     Trachea: No tracheal deviation.  Cardiovascular:     Rate and Rhythm: Normal rate and regular rhythm.  Pulmonary:     Effort: Pulmonary effort is normal. No respiratory distress.     Breath sounds: Normal breath sounds. No stridor. No wheezing or rales.  Abdominal:     General: Bowel sounds are normal. There is no distension.     Palpations: Abdomen is soft.     Tenderness: There is no abdominal tenderness. There is no guarding or rebound.  Musculoskeletal:        General: No tenderness or deformity.     Cervical back: Neck supple.     Right lower leg: Edema present.     Left lower leg: Edema present.  Skin:    General: Skin is warm and dry.     Findings: No rash.  Neurological:     General: No focal deficit present.     Mental Status: She is alert.     Cranial Nerves: No cranial nerve deficit, dysarthria or facial asymmetry.     Sensory: No sensory deficit.     Motor: No abnormal muscle tone or seizure activity.     Coordination: Coordination normal.  Psychiatric:        Mood and Affect: Mood normal.     (all labs ordered are listed, but only abnormal results are displayed) Labs Reviewed  CBC - Abnormal; Notable for the following components:      Result Value   Hemoglobin 10.2 (*)    HCT 33.7 (*)    MCH 25.8 (*)    All other components within normal limits  COMPREHENSIVE METABOLIC PANEL WITH GFR - Abnormal; Notable for the following components:   Glucose, Bld 199 (*)    BUN 24 (*)    Creatinine, Ser 1.31 (*)    Calcium  8.7 (*)    Albumin 3.1 (*)    GFR, Estimated 52 (*)    All other components within normal limits  CBG MONITORING, ED - Abnormal; Notable for the following components:   Glucose-Capillary 195  (*)    All other components within normal limits  RESP PANEL BY RT-PCR (RSV, FLU A&B, COVID)  RVPGX2  HCG, SERUM, QUALITATIVE  LIPASE, BLOOD  TROPONIN I (HIGH SENSITIVITY)  TROPONIN I (HIGH SENSITIVITY)    EKG: EKG Interpretation Date/Time:  Sunday October 22 2024 17:45:44 EST Ventricular Rate:  80 PR  Interval:  137 QRS Duration:  97 QT Interval:  395 QTC Calculation: 456 R Axis:   43  Text Interpretation: Sinus rhythm Low voltage, precordial leads No significant change since last tracing Confirmed by Randol Simmonds [phone]) on 10/22/2024 6:09:23 PM  Radiology: DG Chest 2 View Result Date: 10/22/2024 CLINICAL DATA:  Two day history of chest pain EXAM: CHEST - 2 VIEW COMPARISON:  Chest radiograph dated 06/12/2024 FINDINGS: Under penetrated examination. Normal lung volumes. No focal consolidations. No pleural effusion or pneumothorax. The heart size and mediastinal contours are within normal limits. No acute osseous abnormality. IMPRESSION: No active cardiopulmonary disease. Electronically Signed   By: Limin  Xu M.D.   On: 10/22/2024 16:51     Procedures   Medications Ordered in the ED  acetaminophen  (TYLENOL ) tablet 650 mg (650 mg Oral Given 10/22/24 1751)    Clinical Course as of 10/22/24 1916  Sun Oct 22, 2024  1727 Comprehensive metabolic panel(!) Creatinine increased compared to few months ago.  Pregnancy test negative.  Lipase normal. [JK]  1803 Troponin I (High Sensitivity) First troponin normal. [JK]  1803 Chest x-ray without acute abnormality [JK]    Clinical Course User Index [JK] Randol Simmonds, MD                                 Medical Decision Making Problems Addressed: Chest pain, unspecified type: acute illness or injury that poses a threat to life or bodily functions  Amount and/or Complexity of Data Reviewed Labs: ordered. Decision-making details documented in ED Course. Radiology: ordered and independent interpretation performed.  Risk OTC  drugs.   Patient presented to the ED with complaints of chest pain as well as shortness of breath.  Patient states she has had some chills but denies any coughing.  Presentation concerning for the possibility of pneumonia, CHF exacerbation, ACS, viral syndrome.  ED workup reassuring.  Patient does not have any leukocytosis.  She does not have any fever.  She is breathing normally without any oxygen requirement.  Laboratory tests show normal troponins.  No evidence of pneumonia or pulmonary edema on x-ray.  Viral panel is negative for COVID flu and RSV.  The possible similar symptoms are related to her CHF she does feel like she has not been urinating as much.  I will have her increase her morning torsemide  dose.  Discussed close outpatient follow-up with her cardiologist this week to be rechecked  Evaluation and diagnostic testing in the emergency department does not suggest an emergent condition requiring admission or immediate intervention beyond what has been performed at this time.  The patient is safe for discharge and has been instructed to return immediately for worsening symptoms, change in symptoms or any other concerns.     Final diagnoses:  Chest pain, unspecified type    ED Discharge Orders     None          Randol Simmonds, MD 10/22/24 1916

## 2024-10-22 NOTE — Discharge Instructions (Signed)
 The x-ray did not show any signs of pneumonia.  Your blood test did not show any signs of acute heart injury.  It is possible your symptoms are related to a viral illness and also component of your congestive heart failure.  Increase your torsemide  to 60 mg in the morning and 40 mg in the evening for the next 5 days.  Follow-up with your cardiologist to be rechecked.

## 2024-10-22 NOTE — ED Triage Notes (Signed)
 The pt is c/o abd and chest pain since Friday with nausea and vomiting  lmp middle of august

## 2024-10-25 ENCOUNTER — Emergency Department (HOSPITAL_COMMUNITY)

## 2024-10-25 ENCOUNTER — Encounter (HOSPITAL_COMMUNITY): Payer: Self-pay

## 2024-10-25 ENCOUNTER — Other Ambulatory Visit: Payer: Self-pay

## 2024-10-25 ENCOUNTER — Inpatient Hospital Stay (HOSPITAL_COMMUNITY)
Admission: EM | Admit: 2024-10-25 | Discharge: 2024-10-30 | DRG: 155 | Disposition: A | Discharge status: Home / Self Care | Attending: Internal Medicine | Admitting: Internal Medicine

## 2024-10-25 DIAGNOSIS — G43909 Migraine, unspecified, not intractable, without status migrainosus: Secondary | ICD-10-CM | POA: Diagnosis not present

## 2024-10-25 DIAGNOSIS — Z713 Dietary counseling and surveillance: Secondary | ICD-10-CM

## 2024-10-25 DIAGNOSIS — I110 Hypertensive heart disease with heart failure: Secondary | ICD-10-CM | POA: Diagnosis not present

## 2024-10-25 DIAGNOSIS — E1142 Type 2 diabetes mellitus with diabetic polyneuropathy: Secondary | ICD-10-CM | POA: Diagnosis not present

## 2024-10-25 DIAGNOSIS — D638 Anemia in other chronic diseases classified elsewhere: Secondary | ICD-10-CM | POA: Diagnosis not present

## 2024-10-25 DIAGNOSIS — E1165 Type 2 diabetes mellitus with hyperglycemia: Secondary | ICD-10-CM | POA: Diagnosis not present

## 2024-10-25 DIAGNOSIS — G8929 Other chronic pain: Secondary | ICD-10-CM | POA: Diagnosis present

## 2024-10-25 DIAGNOSIS — F32A Depression, unspecified: Secondary | ICD-10-CM | POA: Diagnosis not present

## 2024-10-25 DIAGNOSIS — R9431 Abnormal electrocardiogram [ECG] [EKG]: Secondary | ICD-10-CM | POA: Diagnosis not present

## 2024-10-25 DIAGNOSIS — Z7985 Long-term (current) use of injectable non-insulin antidiabetic drugs: Secondary | ICD-10-CM

## 2024-10-25 DIAGNOSIS — R739 Hyperglycemia, unspecified: Secondary | ICD-10-CM

## 2024-10-25 DIAGNOSIS — H9190 Unspecified hearing loss, unspecified ear: Secondary | ICD-10-CM | POA: Diagnosis not present

## 2024-10-25 DIAGNOSIS — N179 Acute kidney failure, unspecified: Secondary | ICD-10-CM | POA: Diagnosis not present

## 2024-10-25 DIAGNOSIS — Z789 Other specified health status: Secondary | ICD-10-CM

## 2024-10-25 DIAGNOSIS — H900 Conductive hearing loss, bilateral: Secondary | ICD-10-CM | POA: Diagnosis not present

## 2024-10-25 DIAGNOSIS — F419 Anxiety disorder, unspecified: Secondary | ICD-10-CM | POA: Diagnosis present

## 2024-10-25 DIAGNOSIS — R29818 Other symptoms and signs involving the nervous system: Secondary | ICD-10-CM | POA: Diagnosis not present

## 2024-10-25 DIAGNOSIS — E875 Hyperkalemia: Secondary | ICD-10-CM | POA: Diagnosis not present

## 2024-10-25 DIAGNOSIS — R509 Fever, unspecified: Secondary | ICD-10-CM

## 2024-10-25 DIAGNOSIS — I2510 Atherosclerotic heart disease of native coronary artery without angina pectoris: Secondary | ICD-10-CM | POA: Diagnosis not present

## 2024-10-25 DIAGNOSIS — Z8249 Family history of ischemic heart disease and other diseases of the circulatory system: Secondary | ICD-10-CM

## 2024-10-25 DIAGNOSIS — I5032 Chronic diastolic (congestive) heart failure: Secondary | ICD-10-CM | POA: Diagnosis present

## 2024-10-25 DIAGNOSIS — E871 Hypo-osmolality and hyponatremia: Secondary | ICD-10-CM | POA: Diagnosis present

## 2024-10-25 DIAGNOSIS — H9193 Unspecified hearing loss, bilateral: Principal | ICD-10-CM

## 2024-10-25 DIAGNOSIS — H918X3 Other specified hearing loss, bilateral: Secondary | ICD-10-CM

## 2024-10-25 DIAGNOSIS — H53149 Visual discomfort, unspecified: Secondary | ICD-10-CM | POA: Diagnosis present

## 2024-10-25 DIAGNOSIS — Z794 Long term (current) use of insulin: Secondary | ICD-10-CM

## 2024-10-25 DIAGNOSIS — M549 Dorsalgia, unspecified: Secondary | ICD-10-CM | POA: Diagnosis present

## 2024-10-25 DIAGNOSIS — E785 Hyperlipidemia, unspecified: Secondary | ICD-10-CM | POA: Diagnosis not present

## 2024-10-25 DIAGNOSIS — Z833 Family history of diabetes mellitus: Secondary | ICD-10-CM

## 2024-10-25 DIAGNOSIS — Z6841 Body Mass Index (BMI) 40.0 and over, adult: Secondary | ICD-10-CM | POA: Diagnosis not present

## 2024-10-25 DIAGNOSIS — Z91041 Radiographic dye allergy status: Secondary | ICD-10-CM

## 2024-10-25 DIAGNOSIS — K219 Gastro-esophageal reflux disease without esophagitis: Secondary | ICD-10-CM | POA: Diagnosis present

## 2024-10-25 DIAGNOSIS — Z91018 Allergy to other foods: Secondary | ICD-10-CM

## 2024-10-25 DIAGNOSIS — T380X5A Adverse effect of glucocorticoids and synthetic analogues, initial encounter: Secondary | ICD-10-CM | POA: Diagnosis not present

## 2024-10-25 DIAGNOSIS — Z87891 Personal history of nicotine dependence: Secondary | ICD-10-CM

## 2024-10-25 DIAGNOSIS — G4733 Obstructive sleep apnea (adult) (pediatric): Secondary | ICD-10-CM | POA: Diagnosis not present

## 2024-10-25 DIAGNOSIS — H905 Unspecified sensorineural hearing loss: Secondary | ICD-10-CM

## 2024-10-25 DIAGNOSIS — Z955 Presence of coronary angioplasty implant and graft: Secondary | ICD-10-CM

## 2024-10-25 DIAGNOSIS — R519 Headache, unspecified: Secondary | ICD-10-CM

## 2024-10-25 DIAGNOSIS — Z1152 Encounter for screening for COVID-19: Secondary | ICD-10-CM | POA: Diagnosis not present

## 2024-10-25 DIAGNOSIS — H903 Sensorineural hearing loss, bilateral: Secondary | ICD-10-CM | POA: Diagnosis not present

## 2024-10-25 DIAGNOSIS — Z7982 Long term (current) use of aspirin: Secondary | ICD-10-CM

## 2024-10-25 DIAGNOSIS — R202 Paresthesia of skin: Secondary | ICD-10-CM | POA: Diagnosis not present

## 2024-10-25 DIAGNOSIS — Z888 Allergy status to other drugs, medicaments and biological substances status: Secondary | ICD-10-CM

## 2024-10-25 DIAGNOSIS — H9191 Unspecified hearing loss, right ear: Secondary | ICD-10-CM | POA: Diagnosis not present

## 2024-10-25 DIAGNOSIS — Z79899 Other long term (current) drug therapy: Secondary | ICD-10-CM

## 2024-10-25 LAB — URINALYSIS, ROUTINE W REFLEX MICROSCOPIC
Bilirubin Urine: NEGATIVE
Glucose, UA: NEGATIVE mg/dL
Hgb urine dipstick: NEGATIVE
Ketones, ur: NEGATIVE mg/dL
Nitrite: NEGATIVE
Protein, ur: 100 mg/dL — AB
Specific Gravity, Urine: 1.017 (ref 1.005–1.030)
pH: 5 (ref 5.0–8.0)

## 2024-10-25 LAB — RESP PANEL BY RT-PCR (RSV, FLU A&B, COVID)  RVPGX2
Influenza A by PCR: NEGATIVE
Influenza B by PCR: NEGATIVE
Resp Syncytial Virus by PCR: NEGATIVE
SARS Coronavirus 2 by RT PCR: NEGATIVE

## 2024-10-25 LAB — CBC WITH DIFFERENTIAL/PLATELET
Abs Immature Granulocytes: 0.04 K/uL (ref 0.00–0.07)
Basophils Absolute: 0 K/uL (ref 0.0–0.1)
Basophils Relative: 0 %
Eosinophils Absolute: 0.2 K/uL (ref 0.0–0.5)
Eosinophils Relative: 2 %
HCT: 35.9 % — ABNORMAL LOW (ref 36.0–46.0)
Hemoglobin: 10.6 g/dL — ABNORMAL LOW (ref 12.0–15.0)
Immature Granulocytes: 0 %
Lymphocytes Relative: 25 %
Lymphs Abs: 2.5 K/uL (ref 0.7–4.0)
MCH: 25.2 pg — ABNORMAL LOW (ref 26.0–34.0)
MCHC: 29.5 g/dL — ABNORMAL LOW (ref 30.0–36.0)
MCV: 85.5 fL (ref 80.0–100.0)
Monocytes Absolute: 0.6 K/uL (ref 0.1–1.0)
Monocytes Relative: 6 %
Neutro Abs: 6.4 K/uL (ref 1.7–7.7)
Neutrophils Relative %: 67 %
Platelets: 282 K/uL (ref 150–400)
RBC: 4.2 MIL/uL (ref 3.87–5.11)
RDW: 14.6 % (ref 11.5–15.5)
WBC: 9.8 K/uL (ref 4.0–10.5)
nRBC: 0 % (ref 0.0–0.2)

## 2024-10-25 LAB — COMPREHENSIVE METABOLIC PANEL WITH GFR
ALT: 18 U/L (ref 0–44)
AST: 18 U/L (ref 15–41)
Albumin: 3.1 g/dL — ABNORMAL LOW (ref 3.5–5.0)
Alkaline Phosphatase: 115 U/L (ref 38–126)
Anion gap: 11 (ref 5–15)
BUN: 22 mg/dL — ABNORMAL HIGH (ref 6–20)
CO2: 30 mmol/L (ref 22–32)
Calcium: 9 mg/dL (ref 8.9–10.3)
Chloride: 94 mmol/L — ABNORMAL LOW (ref 98–111)
Creatinine, Ser: 2.08 mg/dL — ABNORMAL HIGH (ref 0.44–1.00)
GFR, Estimated: 30 mL/min — ABNORMAL LOW (ref >60)
Glucose, Bld: 243 mg/dL — ABNORMAL HIGH (ref 70–99)
Potassium: 4.3 mmol/L (ref 3.5–5.1)
Sodium: 135 mmol/L (ref 135–145)
Total Bilirubin: 0.6 mg/dL (ref 0.0–1.2)
Total Protein: 8.2 g/dL — ABNORMAL HIGH (ref 6.5–8.1)

## 2024-10-25 LAB — GLUCOSE, CAPILLARY: Glucose-Capillary: 142 mg/dL — ABNORMAL HIGH (ref 70–99)

## 2024-10-25 LAB — CBG MONITORING, ED
Glucose-Capillary: 297 mg/dL — ABNORMAL HIGH (ref 70–99)
Glucose-Capillary: 101 mg/dL — ABNORMAL HIGH (ref 70–99)

## 2024-10-25 MED ORDER — GADOBUTROL 1 MMOL/ML IV SOLN
10.0000 mL | Freq: Once | INTRAVENOUS | Status: AC | PRN
Start: 2024-10-25 — End: 2024-10-25
  Administered 2024-10-25: 10 mL via INTRAVENOUS

## 2024-10-25 MED ORDER — OXYCODONE HCL 5 MG PO TABS
10.0000 mg | ORAL_TABLET | Freq: Four times a day (QID) | ORAL | Status: DC | PRN
  Administered 2024-10-25 – 2024-10-30 (×11): 10 mg via ORAL
  Filled 2024-10-25 (×11): qty 2

## 2024-10-25 MED ORDER — DEXAMETHASONE SOD PHOSPHATE PF 10 MG/ML IJ SOLN
10.0000 mg | Freq: Four times a day (QID) | INTRAMUSCULAR | Status: DC
  Administered 2024-10-25 – 2024-10-26 (×4): 10 mg via INTRAVENOUS

## 2024-10-25 MED ORDER — SODIUM CHLORIDE 0.9 % IV SOLN
2.0000 g | Freq: Two times a day (BID) | INTRAVENOUS | Status: DC
  Administered 2024-10-26 – 2024-10-27 (×3): 2 g via INTRAVENOUS
  Filled 2024-10-25 (×3): qty 20

## 2024-10-25 MED ORDER — VANCOMYCIN HCL 10 G IV SOLR
2500.0000 mg | Freq: Once | INTRAVENOUS | Status: AC
  Administered 2024-10-25: 2500 mg via INTRAVENOUS
  Filled 2024-10-25: qty 2500

## 2024-10-25 MED ORDER — INSULIN ASPART 100 UNIT/ML IJ SOLN: 0.0000 [IU] | Freq: Every day | INTRAMUSCULAR | Status: DC

## 2024-10-25 MED ORDER — KETOROLAC TROMETHAMINE 30 MG/ML IJ SOLN
30.0000 mg | Freq: Once | INTRAMUSCULAR | Status: AC
  Administered 2024-10-25: 30 mg via INTRAVENOUS
  Filled 2024-10-25: qty 1

## 2024-10-25 MED ORDER — SODIUM CHLORIDE 0.9 % IV SOLN: INTRAVENOUS | Status: DC

## 2024-10-25 MED ORDER — ACETAMINOPHEN 325 MG PO TABS
650.0000 mg | ORAL_TABLET | Freq: Four times a day (QID) | ORAL | Status: DC | PRN
  Administered 2024-10-26 – 2024-10-29 (×2): 650 mg via ORAL
  Filled 2024-10-25 (×2): qty 2

## 2024-10-25 MED ORDER — HEPARIN SODIUM (PORCINE) 5000 UNIT/ML IJ SOLN
5000.0000 [IU] | Freq: Three times a day (TID) | INTRAMUSCULAR | Status: DC
  Administered 2024-10-26 – 2024-10-30 (×14): 5000 [IU] via SUBCUTANEOUS
  Filled 2024-10-25 (×14): qty 1

## 2024-10-25 MED ORDER — VANCOMYCIN HCL IN DEXTROSE 1-5 GM/200ML-% IV SOLN: 1000.0000 mg | Freq: Once | INTRAVENOUS | Status: DC

## 2024-10-25 MED ORDER — DEXTROSE 5 % IV SOLN
1000.0000 mg | Freq: Three times a day (TID) | INTRAVENOUS | Status: DC
  Administered 2024-10-26 – 2024-10-27 (×4): 1000 mg via INTRAVENOUS
  Filled 2024-10-25 (×6): qty 20

## 2024-10-25 MED ORDER — DEXTROSE 5 % IV SOLN
1000.0000 mg | Freq: Once | INTRAVENOUS | Status: AC
  Administered 2024-10-26: 1000 mg via INTRAVENOUS
  Filled 2024-10-25: qty 20

## 2024-10-25 MED ORDER — SODIUM CHLORIDE 0.9 % IV SOLN
2.0000 g | Freq: Once | INTRAVENOUS | Status: AC
  Administered 2024-10-25: 2 g via INTRAVENOUS
  Filled 2024-10-25: qty 20

## 2024-10-25 MED ORDER — ACETAMINOPHEN 650 MG RE SUPP: 650.0000 mg | Freq: Four times a day (QID) | RECTAL | Status: DC | PRN

## 2024-10-25 MED ORDER — ONDANSETRON HCL 4 MG PO TABS
4.0000 mg | ORAL_TABLET | Freq: Four times a day (QID) | ORAL | Status: DC | PRN
  Administered 2024-10-29 – 2024-10-30 (×2): 4 mg via ORAL
  Filled 2024-10-25 (×2): qty 1

## 2024-10-25 MED ORDER — VANCOMYCIN HCL 1500 MG/300ML IV SOLN
1500.0000 mg | INTRAVENOUS | Status: DC
  Administered 2024-10-26: 1500 mg via INTRAVENOUS
  Filled 2024-10-25 (×2): qty 300

## 2024-10-25 MED ORDER — ONDANSETRON HCL 4 MG/2ML IJ SOLN
4.0000 mg | Freq: Four times a day (QID) | INTRAMUSCULAR | Status: DC | PRN
  Administered 2024-10-29: 4 mg via INTRAVENOUS
  Filled 2024-10-25: qty 2

## 2024-10-25 MED ORDER — SODIUM CHLORIDE 0.9 % IV SOLN: 2.0000 g | INTRAVENOUS | Status: DC

## 2024-10-25 MED ORDER — INSULIN ASPART 100 UNIT/ML IJ SOLN
0.0000 [IU] | Freq: Three times a day (TID) | INTRAMUSCULAR | Status: DC
  Administered 2024-10-26: 7 [IU] via SUBCUTANEOUS
  Filled 2024-10-25: qty 7

## 2024-10-25 NOTE — Consult Note (Signed)
 NEUROLOGY CONSULT NOTE   Date of service: October 25, 2024 Patient Name: Lindsay [name] MRN:  [id number] DOB:  May 24, 1981 Chief Complaint: Bilateral hearing loss Requesting Provider: Cottie Donnice PARAS, MD  History of Present Illness  Lindsay [name] is a 43 y.o. female with hx of anemia, renal failure, asthma, depression, diabetes, HLD, HTN, CAD, supermorbid obesity and OSA presenting with headache, acute bilateral complete hearing loss and light sensitivity. She has had a holocephalic headache for about 6 days. She does have a family member with cold symptoms that did resolve. She has had intermittent fevers and chills and then this morning she was suddenly unable to hear. On Friday (10/31) she began feeling sick, having chest pain, increasing fluid retention in her legs, and headache. She was in the hospital on 11/2 and had her torsemide  increased. She was at a family member's house yesterday and at around 3 am she woke up screaming and speaking in tongues and then everything went silent.  She states that while this was going on she was having ringing in her ears that sounded like loud church bells. Everything then went completely quiet.  She is no longer having tinnitus.  Around 10 AM she noticed that the left side of her her face, her left arm and her left leg had numbness.  She also endorses a frontal headache.  She endorses a pins and needle sensation on the left side of her face while being touched and then numbness when she is not being touched.  She endorses a prickling sensation in her arm and her leg that is worse with palpation and to complete numbness without palpation.  She was able to feel vibration bilaterally. She does follow with Dr. Santo with heart care for CAD and CHF. She does state that she still has fluid on her legs, more than usual and left greater than right.   ROS  Comprehensive ROS performed and pertinent positives documented in HPI   Past History   Past  Medical History:  Diagnosis Date   Anemia    Anxiety    ARF (acute renal failure) 04/2015   Arthritis    Asthma    Cellulitis of right upper extremity    Complication of anesthesia    Heart rate drop during epidural,Respiration,and BP   Coronary artery disease    Depression    Diabetes mellitus    insulin  dependent   Hyperlipidemia LDL goal <70    Hypertension    Obesity    S/P angioplasty with stent 08/2016   DES to mLAD and PTCA only to 2nd diag ostium.    Sleep apnea     Past Surgical History:  Procedure Laterality Date   CARDIAC CATHETERIZATION N/A 09/07/2016   Procedure: Left Heart Cath and Coronary Angiography;  Surgeon: Victory LELON Sharps, MD;  Location: [facility] INVASIVE CV LAB;  Service: Cardiovascular;  Laterality: N/A;   CARDIAC CATHETERIZATION N/A 09/07/2016   Procedure: Coronary Stent Intervention;  Surgeon: Victory LELON Sharps, MD;  Location: [facility] INVASIVE CV LAB;  Service: Cardiovascular;  Laterality: N/A;   CARDIAC CATHETERIZATION N/A 09/07/2016   Procedure: Coronary Balloon Angioplasty;  Surgeon: Victory LELON Sharps, MD;  Location: [facility] INVASIVE CV LAB;  Service: Cardiovascular;  Laterality: N/A;   CARPAL TUNNEL RELEASE Right 04/25/2024   Procedure: RIGHT ENDOSCOPIC CARPAL TUNNEL RELEASE;  Surgeon: Beverley Evalene JONETTA, MD;  Location: [facility] ORS;  Service: Orthopedics;  Laterality: Right;   CATARACT EXTRACTION, BILATERAL     CESAREAN SECTION  x 1   IRRIGATION AND DEBRIDEMENT SHOULDER Right 04/30/2015   Procedure: IRRIGATION AND DEBRIDEMENT SHOULDER;  Surgeon: Evalene JONETTA Chancy, MD;  Location: [facility] OR;  Service: Orthopedics;  Laterality: Right;   IRRIGATION AND DEBRIDEMENT SHOULDER Right 05/01/2015   LEFT HEART CATH AND CORONARY ANGIOGRAPHY N/A 04/25/2020   Procedure: LEFT HEART CATH AND CORONARY ANGIOGRAPHY;  Surgeon: Claudene Victory ORN, MD;  Location: [facility] INVASIVE CV LAB;  Service: Cardiovascular;  Laterality: N/A;   LEG SURGERY Right    remobal ob abcess   SHOULDER ARTHROSCOPY Right 04/30/2015    Procedure: ARTHROSCOPY SHOULDER;  Surgeon: Evalene JONETTA Chancy, MD;  Location: [facility] OR;  Service: Orthopedics;  Laterality: Right;   TONSILLECTOMY      Family History: Family History  Problem Relation Age of Onset   Diabetes Mother    Hypertension Mother    Heart disease Mother    Diabetes Father    Heart disease Father    Stroke Maternal Grandmother    Cancer Maternal Grandmother    Breast cancer Cousin        30's   Breast cancer Cousin        49's    Social History  reports that she quit smoking about 17 years ago. Her smoking use included cigarettes. She started smoking about 19 years ago. She has a 0.5 pack-year smoking history. She has never used smokeless tobacco. She reports current alcohol  use. She reports that she does not use drugs.  Allergies  Allergen Reactions   Hydrazine Yellow [Fd&C Yellow #5 (Tartrazine)] Shortness Of Breath and Swelling    Swelling mostly noticed in legs and feet, retaining urination, shortness of breaht, and minor chest pain   Lisinopril  Shortness Of Breath    Was on prinzide ; had sob/chest pain on it.   Pineapple Itching    Medications   Current Facility-Administered Medications:    ketorolac  (TORADOL ) 30 MG/ML injection 30 mg, 30 mg, Intravenous, Once, Trifan, Donnice PARAS, MD  Current Outpatient Medications:    albuterol  (VENTOLIN  HFA) 108 (90 Base) MCG/ACT inhaler, Inhale 2 puffs into the lungs every 6 (six) hours as needed for wheezing or shortness of breath., Disp: 18 g, Rfl: 1   allopurinol (ZYLOPRIM) 100 MG tablet, Take 100 mg by mouth daily. (Patient not taking: Reported on 08/03/2024), Disp: , Rfl:    aspirin  81 MG tablet, Take 1 tablet (81 mg total) by mouth daily., Disp: 30 tablet, Rfl: 11   atorvastatin  (LIPITOR ) 40 MG tablet, Take 1 tablet (40 mg total) by mouth daily., Disp: 90 tablet, Rfl: 1   colchicine 0.6 MG tablet, Take 0.6 mg by mouth daily as needed (Gout). (Patient not taking: Reported on 08/03/2024), Disp: , Rfl:     DULoxetine  (CYMBALTA ) 30 MG capsule, Take 1 capsule (30 mg total) by mouth 2 (two) times daily., Disp: 180 capsule, Rfl: 1   famotidine  (PEPCID ) 20 MG tablet, Take 1 tablet (20 mg total) by mouth 2 (two) times daily., Disp: 14 tablet, Rfl: 0   gabapentin  (NEURONTIN ) 300 MG capsule, Take 2 capsules (600 mg total) by mouth 3 (three) times daily., Disp: 180 capsule, Rfl: 2   hydrALAZINE  (APRESOLINE ) 100 MG tablet, TAKE 1 TABLET(100 MG) BY MOUTH THREE TIMES DAILY, Disp: 180 tablet, Rfl: 1   isosorbide  mononitrate (IMDUR ) 60 MG 24 hr tablet, Take 1 tablet (60 mg total) by mouth daily., Disp: 90 tablet, Rfl: 3   losartan  (COZAAR ) 100 MG tablet, TAKE 1 TABLET(100 MG) BY MOUTH DAILY, Disp: 90 tablet,  Rfl: 3   MOUNJARO 5 MG/0.5ML Pen, Inject 5 mg into the skin once a week., Disp: , Rfl:    nitroGLYCERIN  (NITROSTAT ) 0.4 MG SL tablet, Place 1 tablet (0.4 mg total) under the tongue every 5 (five) minutes as needed for chest pain. Please keep upcoming appt in October 2022. Final Attempt, Disp: 25 tablet, Rfl: 2   ondansetron  (ZOFRAN ) 4 MG tablet, Take 1 tablet (4 mg total) by mouth every 6 (six) hours., Disp: 12 tablet, Rfl: 0   ondansetron  (ZOFRAN -ODT) 4 MG disintegrating tablet, Take 1 tablet (4 mg total) by mouth every 8 (eight) hours as needed for nausea or vomiting., Disp: 10 tablet, Rfl: 0   pantoprazole  (PROTONIX ) 20 MG tablet, Take 1 tablet (20 mg total) by mouth daily. (Patient not taking: Reported on 08/03/2024), Disp: 21 tablet, Rfl: 0   sucralfate  (CARAFATE ) 1 g tablet, Take 1 tablet (1 g total) by mouth 4 (four) times daily -  with meals and at bedtime. (Patient not taking: Reported on 08/03/2024), Disp: 60 tablet, Rfl: 1   tiZANidine  (ZANAFLEX ) 4 MG tablet, Take 1 tablet (4 mg total) by mouth every 6 (six) hours as needed for muscle spasms., Disp: 90 tablet, Rfl: 1   torsemide  (DEMADEX ) 20 MG tablet, Take 2 tablets (40 mg total) by mouth 2 (two) times daily., Disp: 360 tablet, Rfl: 3  Vitals    Vitals:   10/25/24 1435  BP: (!) 141/74  Pulse: 97  Resp: (!) 21  Temp: (!) 100.4 F (38 C)  TempSrc: Oral  SpO2: 95%    There is no height or weight on file to calculate BMI.   Physical Exam   Constitutional: Appears well-developed and well-nourished.  Psych: Affect appropriate to situation.  Eyes: No scleral injection.  HENT: No OP obstruction.  Head: Normocephalic.  Cardiovascular: Normal rate and regular rhythm.  Respiratory: Effort normal, non-labored breathing.  GI: Soft.  No distension. There is no tenderness.  Skin: WDI.   Neurologic Examination   Mental Status: Patient is awake, alert, oriented to person, place, month, year, and situation. Patient is able to give a clear and coherent history. No signs of aphasia or neglect Cranial Nerves: II: Visual Fields are full. Pupils are equal, round, and reactive to light.   III,IV, VI: EOMI without ptosis or diplopia.  V: Facial sensation is symmetric to temperature VII: Facial movement is symmetric resting and smiling VIII: Unable to hear voice or vibration  X: Palate elevates symmetrically XI: Shoulder shrug is symmetric. XII: Tongue protrudes midline without atrophy or fasciculations.  Motor: Tone is normal. Bulk is normal. 5/5 strength was present in all four extremities.  Sensory: Sensation is symmetric to light touch in the arms and legs. No extinction to DSS present. Intact to vibration bilaterally.  Cerebellar: FNF are intact bilaterally Limited range of motion due to body habitus and edema   Labs/Imaging/Neurodiagnostic studies   CBC:  Recent Labs  Lab 10-26-2024 1523  WBC 8.4  HGB 10.2*  HCT 33.7*  MCV 85.1  PLT 246   Basic Metabolic Panel:  Lab Results  Component Value Date   NA 137 Oct 26, 2024   K 4.7 Oct 26, 2024   CO2 27 Oct 26, 2024   GLUCOSE 199 (H) Oct 26, 2024   BUN 24 (H) Oct 26, 2024   CREATININE 1.31 (H) 2024/10/26   CALCIUM  8.7 (L) 2024/10/26   GFRNONAA 52 (L) 10-26-2024   GFRAA 98  12/05/2020   Lipid Panel:  Lab Results  Component Value Date   LDLCALC 100 (H)  03/15/2024   HgbA1c:  Lab Results  Component Value Date   HGBA1C 9.8 (H) 04/13/2024   Urine Drug Screen:     Component Value Date/Time   LABOPIA NEGATIVE 06/11/2011 0914   COCAINSCRNUR NEGATIVE 06/11/2011 0914   LABBENZ NEGATIVE 06/11/2011 0914   AMPHETMU NEGATIVE 06/11/2011 0914    Alcohol  Level No results found for: [facility] INR  Lab Results  Component Value Date   INR 1.1 07/10/2021   APTT  Lab Results  Component Value Date   APTT 31 07/10/2021   AED levels: No results found for: PHENYTOIN, ZONISAMIDE, LAMOTRIGINE, LEVETIRACETA  CT Head without contrast(Personally reviewed): Negative CT Head  MRI Brain: Pending  ASSESSMENT   Lindsay [name] is a 43 y.o. female  w/ hx of CAD and supermorbid obesity, presenting to the ED with 6 days of headache and abrupt onset hearing loss this morning.  She endorses a headache for approximately 6 days.  She also endorses having a CHF exacerbation and she was in the ER on Sunday.  They did increase her torsemide  at that time.  She had an episode around 3 AM of screaming and speaking in tongues; while this was going on she was having tinnitus that she described as loud church bells and then abrupt silence.  Around 10 AM she developed numbness on the left side of her face and body that has persisted.  At this time we are recommending MRI, lumbar puncture, and to discontinue torsemide .  Her Cr. Is at 2.08 from 1.21 on 11/2 and 0.9 in June. CBC and UA is unremarkable. Diabetes uncontrolled with HgA1C 9.8.  RECOMMENDATIONS  - MRI Brain  - Lumbar puncture  - Protein and glucose, cell count on tubes 1 and 4, meningitis/encephalitis panel, IgG index, cryptococcal antigen, VDRL - Empiric meningitis coverage  - ID Consult  - Start a diuretic that does not have potential side effect of hearing loss, as is the case with torsemide  and Lasix   Addendum: - MRI  brain without contrast: Normal.  - MRI brain with contrast, including thin cuts through the IACs: No evidence of acute abnormality or abnormal enhancement.  ______________________________________________________________________    Bonney Jorene Last, NP [facility]  I have seen and examined the patient. I have formulated the assessment and recommendations. 43 y.o. female  w/ hx of CAD and supermorbid obesity, presenting to the ED with 6 days of headache and abrupt onset hearing loss this morning.  She endorses a headache for approximately 6 days.  Exam reveals apparent complete sensorineural hearing loss. Exam by EDP of the EACs revealed normal appearing tympanic membranes. DDx is broad including stroke, toxic, infectious and autoimmune etiologies. Recommendations as above.  Electronically signed: Dr. Sharae Zappulla

## 2024-10-25 NOTE — ED Provider Triage Note (Signed)
 Emergency Medicine Provider Triage Evaluation Note  Lindsay [name] , a 43 y.o. female  was evaluated in triage.  Pt complains of acute onset hearing loss upon waking approximately 3 AM today bilaterally, accompanied with tinnitus bilaterally, headache, decreased sensation to left arm.  Noted to have history of migraines with current headache similar to previous.  Denies fever, blurry vision, chest pain, shortness of breath, abdominal pain.  Review of Systems  Positive: N/a Negative: N/a  Physical Exam  BP (!) 141/74   Pulse 97   Temp (!) 100.4 F (38 C) (Oral)   Resp (!) 21   SpO2 95%  Gen:   Awake, no distress   Resp:  Normal effort  MSK:   Moves extremities without difficulty  Other:  Reports decree sensation to left arm  No facial asymmetry, no ataxia, no apraxia, no aphasia, no arm drift, normal coordination with finger-to-nose, normal grip strength bilaterally, normal strength to both flexion and extension to both upper lower extremities 5+ bilaterally, no visual field deficits, no nystagmus.   Medical Decision Making  Medically screening exam initiated at 2:44 PM.  Appropriate orders placed.  Lindsay [name] was informed that the remainder of the evaluation will be completed by another provider, this initial triage assessment does not replace that evaluation, and the importance of remaining in the ED until their evaluation is complete.     Lindsay [name], [place] 10/25/24 1447

## 2024-10-25 NOTE — Consult Note (Incomplete)
 NEUROLOGY CONSULT NOTE   Date of service: October 25, 2024 Patient Name: Lindsay [name] MRN:  [id number] DOB:  31-Oct-1981 Chief Complaint: Bilateral hearing loss Requesting Provider: Cottie Donnice PARAS, MD  History of Present Illness  Lindsay [name] is a 43 y.o. female with hx of anemia, renal failure, asthma, depression, diabetes, HLD, HTN, CAD, supermorbid obesity and OSA presenting with headache, acute bilateral complete hearing loss and light sensitivity. She has had a holocephalic headache for about 6 days. She does have a family member with cold symptoms that did resolve. She has had intermittent fevers and chills and then this morning she was suddenly unable to hear. On Friday (10/31) she began feeling sick, having chest pain, increasing fluid retention in her legs, and headache. She was in the hospital on 11/2 and had her torsemide  increased. She was at a family member's house yesterday and at around 3 am she woke up screaming and speaking in tongues and then everything went silent.  She states that while this was going on she was having ringing in her ears that sounded like loud church bells. Everything then went completely quiet.  She is no longer having tinnitus.  Around 10 AM she noticed that the left side of her her face, her left arm and her left leg had numbness.  She also endorses a frontal headache.  She endorses a pins and needle sensation on the left side of her face while being touched and then numbness when she is not being touched.  She endorses a prickling sensation in her arm and her leg that is worse with palpation and to complete numbness without palpation.  She was able to feel vibration bilaterally. She does follow with Dr. Santo with heart care for CAD and CHF. She does state that she still has fluid on her legs, more than usual and left greater than right.   ROS  Comprehensive ROS performed and pertinent positives documented in HPI   Past History   Past  Medical History:  Diagnosis Date  . Anemia   . Anxiety   . ARF (acute renal failure) 04/2015  . Arthritis   . Asthma   . Cellulitis of right upper extremity   . Complication of anesthesia    Heart rate drop during epidural,Respiration,and BP  . Coronary artery disease   . Depression   . Diabetes mellitus    insulin  dependent  . Hyperlipidemia LDL goal <70   . Hypertension   . Obesity   . S/P angioplasty with stent 08/2016   DES to mLAD and PTCA only to 2nd diag ostium.   . Sleep apnea     Past Surgical History:  Procedure Laterality Date  . CARDIAC CATHETERIZATION N/A 09/07/2016   Procedure: Left Heart Cath and Coronary Angiography;  Surgeon: Victory LELON Sharps, MD;  Location: [facility] INVASIVE CV LAB;  Service: Cardiovascular;  Laterality: N/A;  . CARDIAC CATHETERIZATION N/A 09/07/2016   Procedure: Coronary Stent Intervention;  Surgeon: Victory LELON Sharps, MD;  Location: [facility] INVASIVE CV LAB;  Service: Cardiovascular;  Laterality: N/A;  . CARDIAC CATHETERIZATION N/A 09/07/2016   Procedure: Coronary Balloon Angioplasty;  Surgeon: Victory LELON Sharps, MD;  Location: [facility] INVASIVE CV LAB;  Service: Cardiovascular;  Laterality: N/A;  . CARPAL TUNNEL RELEASE Right 04/25/2024   Procedure: RIGHT ENDOSCOPIC CARPAL TUNNEL RELEASE;  Surgeon: Beverley Evalene JONETTA, MD;  Location: [facility] ORS;  Service: Orthopedics;  Laterality: Right;  . CATARACT EXTRACTION, BILATERAL    . CESAREAN SECTION  x 1  . IRRIGATION AND DEBRIDEMENT SHOULDER Right 04/30/2015   Procedure: IRRIGATION AND DEBRIDEMENT SHOULDER;  Surgeon: Evalene JONETTA Chancy, MD;  Location: [facility] OR;  Service: Orthopedics;  Laterality: Right;  . IRRIGATION AND DEBRIDEMENT SHOULDER Right 05/01/2015  . LEFT HEART CATH AND CORONARY ANGIOGRAPHY N/A 04/25/2020   Procedure: LEFT HEART CATH AND CORONARY ANGIOGRAPHY;  Surgeon: Claudene Victory ORN, MD;  Location: [facility] INVASIVE CV LAB;  Service: Cardiovascular;  Laterality: N/A;  . LEG SURGERY Right    remobal ob abcess  . SHOULDER  ARTHROSCOPY Right 04/30/2015   Procedure: ARTHROSCOPY SHOULDER;  Surgeon: Evalene JONETTA Chancy, MD;  Location: [facility] OR;  Service: Orthopedics;  Laterality: Right;  . TONSILLECTOMY      Family History: Family History  Problem Relation Age of Onset  . Diabetes Mother   . Hypertension Mother   . Heart disease Mother   . Diabetes Father   . Heart disease Father   . Stroke Maternal Grandmother   . Cancer Maternal Grandmother   . Breast cancer Cousin        30's  . Breast cancer Cousin        81's    Social History  reports that she quit smoking about 17 years ago. Her smoking use included cigarettes. She started smoking about 19 years ago. She has a 0.5 pack-year smoking history. She has never used smokeless tobacco. She reports current alcohol  use. She reports that she does not use drugs.  Allergies  Allergen Reactions  . Hydrazine Yellow [Fd&C Yellow #5 (Tartrazine)] Shortness Of Breath and Swelling    Swelling mostly noticed in legs and feet, retaining urination, shortness of breaht, and minor chest pain  . Lisinopril  Shortness Of Breath    Was on prinzide ; had sob/chest pain on it.  . Pineapple Itching    Medications   Current Facility-Administered Medications:  .  ketorolac  (TORADOL ) 30 MG/ML injection 30 mg, 30 mg, Intravenous, Once, Trifan, Donnice PARAS, MD  Current Outpatient Medications:  .  albuterol  (VENTOLIN  HFA) 108 (90 Base) MCG/ACT inhaler, Inhale 2 puffs into the lungs every 6 (six) hours as needed for wheezing or shortness of breath., Disp: 18 g, Rfl: 1 .  allopurinol (ZYLOPRIM) 100 MG tablet, Take 100 mg by mouth daily. (Patient not taking: Reported on 08/03/2024), Disp: , Rfl:  .  aspirin  81 MG tablet, Take 1 tablet (81 mg total) by mouth daily., Disp: 30 tablet, Rfl: 11 .  atorvastatin  (LIPITOR ) 40 MG tablet, Take 1 tablet (40 mg total) by mouth daily., Disp: 90 tablet, Rfl: 1 .  colchicine 0.6 MG tablet, Take 0.6 mg by mouth daily as needed (Gout). (Patient not  taking: Reported on 08/03/2024), Disp: , Rfl:  .  DULoxetine  (CYMBALTA ) 30 MG capsule, Take 1 capsule (30 mg total) by mouth 2 (two) times daily., Disp: 180 capsule, Rfl: 1 .  famotidine  (PEPCID ) 20 MG tablet, Take 1 tablet (20 mg total) by mouth 2 (two) times daily., Disp: 14 tablet, Rfl: 0 .  gabapentin  (NEURONTIN ) 300 MG capsule, Take 2 capsules (600 mg total) by mouth 3 (three) times daily., Disp: 180 capsule, Rfl: 2 .  hydrALAZINE  (APRESOLINE ) 100 MG tablet, TAKE 1 TABLET(100 MG) BY MOUTH THREE TIMES DAILY, Disp: 180 tablet, Rfl: 1 .  isosorbide  mononitrate (IMDUR ) 60 MG 24 hr tablet, Take 1 tablet (60 mg total) by mouth daily., Disp: 90 tablet, Rfl: 3 .  losartan  (COZAAR ) 100 MG tablet, TAKE 1 TABLET(100 MG) BY MOUTH DAILY, Disp: 90 tablet,  Rfl: 3 .  MOUNJARO 5 MG/0.5ML Pen, Inject 5 mg into the skin once a week., Disp: , Rfl:  .  nitroGLYCERIN  (NITROSTAT ) 0.4 MG SL tablet, Place 1 tablet (0.4 mg total) under the tongue every 5 (five) minutes as needed for chest pain. Please keep upcoming appt in October 2022. Final Attempt, Disp: 25 tablet, Rfl: 2 .  ondansetron  (ZOFRAN ) 4 MG tablet, Take 1 tablet (4 mg total) by mouth every 6 (six) hours., Disp: 12 tablet, Rfl: 0 .  ondansetron  (ZOFRAN -ODT) 4 MG disintegrating tablet, Take 1 tablet (4 mg total) by mouth every 8 (eight) hours as needed for nausea or vomiting., Disp: 10 tablet, Rfl: 0 .  pantoprazole  (PROTONIX ) 20 MG tablet, Take 1 tablet (20 mg total) by mouth daily. (Patient not taking: Reported on 08/03/2024), Disp: 21 tablet, Rfl: 0 .  sucralfate  (CARAFATE ) 1 g tablet, Take 1 tablet (1 g total) by mouth 4 (four) times daily -  with meals and at bedtime. (Patient not taking: Reported on 08/03/2024), Disp: 60 tablet, Rfl: 1 .  tiZANidine  (ZANAFLEX ) 4 MG tablet, Take 1 tablet (4 mg total) by mouth every 6 (six) hours as needed for muscle spasms., Disp: 90 tablet, Rfl: 1 .  torsemide  (DEMADEX ) 20 MG tablet, Take 2 tablets (40 mg total) by mouth 2  (two) times daily., Disp: 360 tablet, Rfl: 3  Vitals   Vitals:   10/25/24 1435  BP: (!) 141/74  Pulse: 97  Resp: (!) 21  Temp: (!) 100.4 F (38 C)  TempSrc: Oral  SpO2: 95%    There is no height or weight on file to calculate BMI.   Physical Exam   Constitutional: Appears well-developed and well-nourished.  Psych: Affect appropriate to situation.  Eyes: No scleral injection.  HENT: No OP obstruction.  Head: Normocephalic.  Cardiovascular: Normal rate and regular rhythm.  Respiratory: Effort normal, non-labored breathing.  GI: Soft.  No distension. There is no tenderness.  Skin: WDI.   Neurologic Examination   Mental Status: Patient is awake, alert, oriented to person, place, month, year, and situation. Patient is able to give a clear and coherent history. No signs of aphasia or neglect Cranial Nerves: II: Visual Fields are full. Pupils are equal, round, and reactive to light.   III,IV, VI: EOMI without ptosis or diplopia.  V: Facial sensation is symmetric to temperature VII: Facial movement is symmetric resting and smiling VIII: Unable to hear voice or vibration  X: Palate elevates symmetrically XI: Shoulder shrug is symmetric. XII: Tongue protrudes midline without atrophy or fasciculations.  Motor: Tone is normal. Bulk is normal. 5/5 strength was present in all four extremities.  Sensory: Sensation is symmetric to light touch in the arms and legs. No extinction to DSS present. Intact to vibration bilaterally.  Cerebellar: FNF are intact bilaterally Limited range of motion due to body habitus and edema   Labs/Imaging/Neurodiagnostic studies   CBC:  Recent Labs  Lab 28-Oct-2024 1523  WBC 8.4  HGB 10.2*  HCT 33.7*  MCV 85.1  PLT 246   Basic Metabolic Panel:  Lab Results  Component Value Date   NA 137 10-28-2024   K 4.7 2024-10-28   CO2 27 10/28/2024   GLUCOSE 199 (H) 2024-10-28   BUN 24 (H) 10/28/2024   CREATININE 1.31 (H) 10-28-24   CALCIUM  8.7  (L) 10/28/24   GFRNONAA 52 (L) 2024/10/28   GFRAA 98 12/05/2020   Lipid Panel:  Lab Results  Component Value Date   LDLCALC 100 (H)  03/15/2024   HgbA1c:  Lab Results  Component Value Date   HGBA1C 9.8 (H) 04/13/2024   Urine Drug Screen:     Component Value Date/Time   LABOPIA NEGATIVE 06/11/2011 0914   COCAINSCRNUR NEGATIVE 06/11/2011 0914   LABBENZ NEGATIVE 06/11/2011 0914   AMPHETMU NEGATIVE 06/11/2011 0914    Alcohol  Level No results found for: [facility] INR  Lab Results  Component Value Date   INR 1.1 07/10/2021   APTT  Lab Results  Component Value Date   APTT 31 07/10/2021   AED levels: No results found for: PHENYTOIN, ZONISAMIDE, LAMOTRIGINE, LEVETIRACETA  CT Head without contrast(Personally reviewed): Negative CT Head  MRI Brain: Pending  ASSESSMENT   Lindsay [name] is a 43 y.o. female  w/ hx of CAD and supermorbid obesity, presenting to the ED with 6 days of headache and abrupt onset hearing loss this morning.  She endorses a headache for approximately 6 days.  She also endorses having a CHF exacerbation and she was in the ER on Sunday.  They did increase her torsemide  at that time.  She had an episode around 3 AM of screaming and speaking in tongues; while this was going on she was having tinnitus that she described as loud church bells and then abrupt silence.  Around 10 AM she developed numbness on the left side of her face and body that has persisted.  At this time we are recommending MRI, lumbar puncture, and to discontinue torsemide .  Her Cr. Is at 2.08 from 1.21 on 11/2 and 0.9 in June. CBC and UA is unremarkable. Diabetes uncontrolled with HgA1C 9.8.  RECOMMENDATIONS  - MRI Brain  - Lumbar puncture  - Protein and glucose, cell count on tubes 1 and 4, meningitis/encephalitis panel, IgG index, cryptococcal antigen, VDRL - Empiric meningitis coverage  - ID Consult  - Start a diuretic that does not have potential side effect of hearing loss, as  is the case with torsemide  and Lasix    ______________________________________________________________________    Bonney Jorene Last, NP [facility]  I have seen and examined the patient. I have formulated the assessment and recommendations. 43 y.o. female  w/ hx of CAD and supermorbid obesity, presenting to the ED with 6 days of headache and abrupt onset hearing loss this morning.  She endorses a headache for approximately 6 days.  Exam reveals apparent complete hearing loss. Exam by EDP of the EACs revealed normal appearing tympanic membranes. DDx is broad including stroke, infectious and autoimmune etiologies. Recommendations as above.  Electronically signed: Dr. Orel Hord

## 2024-10-25 NOTE — ED Notes (Signed)
 Patient transported to MRI

## 2024-10-25 NOTE — Progress Notes (Signed)
 ED Pharmacy Antibiotic Sign Off An antibiotic consult was received from an ED provider for vancomycin  and acyclovir per pharmacy dosing for concern for meningitis. A chart review was completed to assess appropriateness.   The following one time order(s) were placed:  Vancomycin  2500 mg IV x 1 Acyclovir 10 mg/kg IV x 1 (adjusted body weight dosing)  Further antibiotic and/or antibiotic pharmacy consults should be ordered by the admitting provider if indicated.   Thank you for allowing pharmacy to be a part of this patient's care.   Dorn Poot, [facility]  Clinical Pharmacist 10/25/24 7:21 PM

## 2024-10-25 NOTE — H&P (Signed)
 History and Physical    Patient: Lindsay [name] FMW:[id number] DOB: October 28, 1981 DOA: 10/25/2024 DOS: the patient was seen and examined on 10/25/2024 PCP: Celestia Rosaline SQUIBB, NP  Patient coming from: Home  Chief Complaint: No chief complaint on file.  HPI: Lindsay [name] is a 43 y.o. female with medical history significant of T2DM,  CAD s/p PCI, OSA, HFpEF, HTN, migraines, presenting to the emergency department for evaluation of sudden onset hearing loss first noted this morning.  She has had a throbbing, frontal headache with associated nausea, fevers, photophobia, and chills since Friday.  She also reports left upper and lower extremity and left-sided facial numbness. No neck pain.  She was seen in the emergency department for CP and SOB over the weekend and was instructed to increase her torsemide . She reports her breathing has improved, but the headache and fevers have persisted.  This morning,  she woke up with loud screeching in both ears, and then everything went silent prompting presentation here.   In the ED, she had a fever of 100.4 but was non-toxic appearing, hemodynamically stable without leukocytosis. CT head, MRI brain with and without contrast unremarkable.  COVID flu and RSV negative The ED physician spoke with Neurology who recommended LP empiric antibiotic coverage for meningitis, and ID consult.  LP was offered in the ED but patient refused and requested LP be done fluoroscopically in the morning.  Admit to hospitalist service for further management.    Review of Systems: Review of Systems  Constitutional:  Positive for chills and fever. Negative for diaphoresis, malaise/fatigue and weight loss.  Eyes:  Positive for photophobia. Negative for blurred vision, double vision and pain.  Respiratory:  Negative for cough, sputum production, shortness of breath and wheezing.   Cardiovascular:  Negative for chest pain and leg swelling.  Gastrointestinal:  Negative for abdominal  pain, diarrhea, heartburn, nausea and vomiting.  Genitourinary:  Negative for dysuria and frequency.  Musculoskeletal:  Negative for back pain and neck pain.  Neurological:  Positive for dizziness, tingling, sensory change and headaches. Negative for tremors, speech change, focal weakness, seizures, loss of consciousness and weakness.    Past Medical History:  Diagnosis Date   Anemia    Anxiety    ARF (acute renal failure) 04/2015   Arthritis    Asthma    Cellulitis of right upper extremity    Complication of anesthesia    Heart rate drop during epidural,Respiration,and BP   Coronary artery disease    Depression    Diabetes mellitus    insulin  dependent   Hyperlipidemia LDL goal <70    Hypertension    Obesity    S/P angioplasty with stent 08/2016   DES to mLAD and PTCA only to 2nd diag ostium.    Sleep apnea    Past Surgical History:  Procedure Laterality Date   CARDIAC CATHETERIZATION N/A 09/07/2016   Procedure: Left Heart Cath and Coronary Angiography;  Surgeon: Victory LELON Sharps, MD;  Location: [facility] INVASIVE CV LAB;  Service: Cardiovascular;  Laterality: N/A;   CARDIAC CATHETERIZATION N/A 09/07/2016   Procedure: Coronary Stent Intervention;  Surgeon: Victory LELON Sharps, MD;  Location: [facility] INVASIVE CV LAB;  Service: Cardiovascular;  Laterality: N/A;   CARDIAC CATHETERIZATION N/A 09/07/2016   Procedure: Coronary Balloon Angioplasty;  Surgeon: Victory LELON Sharps, MD;  Location: [facility] INVASIVE CV LAB;  Service: Cardiovascular;  Laterality: N/A;   CARPAL TUNNEL RELEASE Right 04/25/2024   Procedure: RIGHT ENDOSCOPIC CARPAL TUNNEL RELEASE;  Surgeon: Beverley Lye  D, MD;  Location: [facility] ORS;  Service: Orthopedics;  Laterality: Right;   CATARACT EXTRACTION, BILATERAL     CESAREAN SECTION     x 1   IRRIGATION AND DEBRIDEMENT SHOULDER Right 04/30/2015   Procedure: IRRIGATION AND DEBRIDEMENT SHOULDER;  Surgeon: Evalene JONETTA Chancy, MD;  Location: [facility] OR;  Service: Orthopedics;  Laterality: Right;   IRRIGATION  AND DEBRIDEMENT SHOULDER Right 05/01/2015   LEFT HEART CATH AND CORONARY ANGIOGRAPHY N/A 04/25/2020   Procedure: LEFT HEART CATH AND CORONARY ANGIOGRAPHY;  Surgeon: Claudene Victory ORN, MD;  Location: [facility] INVASIVE CV LAB;  Service: Cardiovascular;  Laterality: N/A;   LEG SURGERY Right    remobal ob abcess   SHOULDER ARTHROSCOPY Right 04/30/2015   Procedure: ARTHROSCOPY SHOULDER;  Surgeon: Evalene JONETTA Chancy, MD;  Location: [facility] OR;  Service: Orthopedics;  Laterality: Right;   TONSILLECTOMY     Social History:  reports that she quit smoking about 17 years ago. Her smoking use included cigarettes. She started smoking about 19 years ago. She has a 0.5 pack-year smoking history. She has never used smokeless tobacco. She reports current alcohol  use. She reports that she does not use drugs.  Allergies  Allergen Reactions   Hydrazine Yellow [Fd&C Yellow #5 (Tartrazine)] Shortness Of Breath and Swelling    Swelling mostly noticed in legs and feet, retaining urination, shortness of breaht, and minor chest pain   Lisinopril  Shortness Of Breath    Was on prinzide ; had sob/chest pain on it.   Pineapple Itching   Pineapple Extract     Family History  Problem Relation Age of Onset   Diabetes Mother    Hypertension Mother    Heart disease Mother    Diabetes Father    Heart disease Father    Stroke Maternal Grandmother    Cancer Maternal Grandmother    Breast cancer Cousin        30's   Breast cancer Cousin        50's    Prior to Admission medications   Medication Sig Start Date End Date Taking? Authorizing Provider  albuterol  (VENTOLIN  HFA) 108 (90 Base) MCG/ACT inhaler Inhale 2 puffs into the lungs every 6 (six) hours as needed for wheezing or shortness of breath. 03/15/24   Celestia Rosaline SQUIBB, NP  allopurinol (ZYLOPRIM) 100 MG tablet Take 100 mg by mouth daily. Patient not taking: Reported on 08/03/2024    [provider]  aspirin  81 MG tablet Take 1 tablet (81 mg total) by mouth daily.  01/13/18   Kristy Sharolyn Lenis, PA-C  atorvastatin  (LIPITOR ) 40 MG tablet Take 1 tablet (40 mg total) by mouth daily. 03/17/24   Celestia Rosaline SQUIBB, NP  colchicine 0.6 MG tablet Take 0.6 mg by mouth daily as needed (Gout). Patient not taking: Reported on 08/03/2024 03/20/24   [provider]  DULoxetine  (CYMBALTA ) 30 MG capsule Take 1 capsule (30 mg total) by mouth 2 (two) times daily. 05/03/24   Celestia Rosaline SQUIBB, NP  famotidine  (PEPCID ) 20 MG tablet Take 1 tablet (20 mg total) by mouth 2 (two) times daily. 06/13/24   Geiple, Joshua, PA-C  gabapentin  (NEURONTIN ) 300 MG capsule Take 2 capsules (600 mg total) by mouth 3 (three) times daily. 09/01/24   Celestia Rosaline SQUIBB, NP  hydrALAZINE  (APRESOLINE ) 100 MG tablet TAKE 1 TABLET(100 MG) BY MOUTH THREE TIMES DAILY 10/11/24   Celestia Rosaline SQUIBB, NP  isosorbide  mononitrate (IMDUR ) 60 MG 24 hr tablet Take 1 tablet (60 mg total) by mouth daily.  03/09/23   Chandrasekhar, Mahesh A, MD  losartan  (COZAAR ) 100 MG tablet TAKE 1 TABLET(100 MG) BY MOUTH DAILY 05/19/24   Chandrasekhar, Mahesh A, MD  MOUNJARO 5 MG/0.5ML Pen Inject 5 mg into the skin once a week. 12/20/23   [provider]  nitroGLYCERIN  (NITROSTAT ) 0.4 MG SL tablet Place 1 tablet (0.4 mg total) under the tongue every 5 (five) minutes as needed for chest pain. Please keep upcoming appt in October 2022. Final Attempt 05/03/24   Celestia Rosaline SQUIBB, NP  ondansetron  (ZOFRAN ) 4 MG tablet Take 1 tablet (4 mg total) by mouth every 6 (six) hours. 10/22/24   Randol Simmonds, MD  ondansetron  (ZOFRAN -ODT) 4 MG disintegrating tablet Take 1 tablet (4 mg total) by mouth every 8 (eight) hours as needed for nausea or vomiting. 06/13/24   Desiderio Chew, PA-C  pantoprazole  (PROTONIX ) 20 MG tablet Take 1 tablet (20 mg total) by mouth daily. Patient not taking: Reported on 08/03/2024 06/13/24   Desiderio Chew, PA-C  sucralfate  (CARAFATE ) 1 g tablet Take 1 tablet (1 g total) by mouth 4 (four) times daily -  with meals  and at bedtime. Patient not taking: Reported on 08/03/2024 06/13/24   Desiderio Chew, PA-C  tiZANidine  (ZANAFLEX ) 4 MG tablet Take 1 tablet (4 mg total) by mouth every 6 (six) hours as needed for muscle spasms. 09/08/24   Celestia Rosaline SQUIBB, NP  torsemide  (DEMADEX ) 20 MG tablet Take 2 tablets (40 mg total) by mouth 2 (two) times daily. 03/15/24   Celestia Rosaline SQUIBB, NP    Physical Exam: Vitals:   10/25/24 1435 10/25/24 1959  BP: (!) 141/74 111/60  Pulse: 97 99  Resp: (!) 21 20  Temp: (!) 100.4 F (38 C) 98.9 F (37.2 C)  TempSrc: Oral Oral  SpO2: 95% 96%  Physical Exam Vitals and nursing note reviewed.  Constitutional:      General: She is not in acute distress.    Appearance: She is obese. She is not ill-appearing or toxic-appearing.  HENT:     Head: Normocephalic and atraumatic.  Eyes:     Extraocular Movements: Extraocular movements intact.     Pupils: Pupils are equal, round, and reactive to light.     Comments: No nystagmus   Cardiovascular:     Rate and Rhythm: Normal rate and regular rhythm.  Pulmonary:     Effort: Pulmonary effort is normal. No respiratory distress.     Breath sounds: Normal breath sounds. No wheezing.  Abdominal:     General: Abdomen is flat. There is no distension.     Palpations: Abdomen is soft.     Tenderness: There is no abdominal tenderness.  Musculoskeletal:     Cervical back: Normal range of motion and neck supple. No rigidity.     Right lower leg: Edema present.     Left lower leg: Edema present.  Skin:    General: Skin is warm and dry.     Capillary Refill: Capillary refill takes less than 2 seconds.  Neurological:     Mental Status: She is alert and oriented to person, place, and time.     Cranial Nerves: No dysarthria or facial asymmetry.     Sensory: Sensory deficit (decreased sensation on left side of face, left upper and lower extremity) present.     Motor: No weakness.     Coordination: Coordination normal.     Comments:  Decreased hearing bilaterally   Psychiatric:        Mood and Affect:  Mood normal.        Behavior: Behavior normal.     Data Reviewed:   Labs on Admission: I have personally reviewed following labs and imaging studies  CBC: Recent Labs  Lab 10/22/24 1523 10/25/24 1629  WBC 8.4 9.8  NEUTROABS  --  6.4  HGB 10.2* 10.6*  HCT 33.7* 35.9*  MCV 85.1 85.5  PLT 246 282   Basic Metabolic Panel: Recent Labs  Lab 10/22/24 1523 10/25/24 1629  NA 137 135  K 4.7 4.3  CL 98 94*  CO2 27 30  GLUCOSE 199* 243*  BUN 24* 22*  CREATININE 1.31* 2.08*  CALCIUM  8.7* 9.0   GFR: Estimated Creatinine Clearance: 57.6 mL/min (A) (by C-G formula based on SCr of 2.08 mg/dL (H)). Liver Function Tests: Recent Labs  Lab 10/22/24 1523 10/25/24 1629  AST 22 18  ALT 18 18  ALKPHOS 89 115  BILITOT 0.9 0.6  PROT 7.2 8.2*  ALBUMIN 3.1* 3.1*   Recent Labs  Lab 10/22/24 1523  LIPASE 21   No results for input(s): AMMONIA in the last 168 hours. Coagulation Profile: No results for input(s): INR, PROTIME in the last 168 hours. Cardiac Enzymes: No results for input(s): CKTOTAL, CKMB, CKMBINDEX, TROPONINI in the last 168 hours. BNP (last 3 results) No results for input(s): PROBNP in the last 8760 hours. HbA1C: No results for input(s): HGBA1C in the last 72 hours. CBG: Recent Labs  Lab 10/22/24 1512 10/25/24 1435 10/25/24 2209  GLUCAP 195* 297* 101*   Lipid Profile: No results for input(s): CHOL, HDL, LDLCALC, TRIG, CHOLHDL, LDLDIRECT in the last 72 hours. Thyroid Function Tests: No results for input(s): TSH, T4TOTAL, FREET4, T3FREE, THYROIDAB in the last 72 hours. Anemia Panel: No results for input(s): VITAMINB12, FOLATE, FERRITIN, TIBC, IRON, RETICCTPCT in the last 72 hours. Urine analysis:    Component Value Date/Time   COLORURINE YELLOW 10/25/2024 1727   APPEARANCEUR HAZY (A) 10/25/2024 1727   LABSPEC 1.017 10/25/2024 1727    PHURINE 5.0 10/25/2024 1727   GLUCOSEU NEGATIVE 10/25/2024 1727   GLUCOSEU >=1000 (A) 02/18/2017 1018   HGBUR NEGATIVE 10/25/2024 1727   BILIRUBINUR NEGATIVE 10/25/2024 1727   BILIRUBINUR negative 08/03/2024 1505   KETONESUR NEGATIVE 10/25/2024 1727   PROTEINUR 100 (A) 10/25/2024 1727   UROBILINOGEN 1.0 08/03/2024 1505   UROBILINOGEN 0.2 02/18/2017 1018   NITRITE NEGATIVE 10/25/2024 1727   LEUKOCYTESUR TRACE (A) 10/25/2024 1727    Radiological Exams on Admission: MR BRAIN W CONTRAST Result Date: 10/25/2024 EXAM: MR Brain with Intravenous Contrast. CLINICAL HISTORY: Hearing loss, headache, encephalitis evaluation. TECHNIQUE: Magnetic resonance images of the brain with intravenous contrast in multiple planes. CONTRAST: With; 10mL (gadobutrol (GADAVIST) 1 MMOL/ML injection 10 mL GADOBUTROL 1 MMOL/ML IV SOLN). COMPARISON: Same day MRI head and CT head FINDINGS: Only IAC and postcontrast imaging performed to further evaluate after MRI head done earlier today. Normal enhancement. On motion-limited evaluation, no obvious mass along the course of the 7th and 8th cranial nerves. Normal csf signal within the labyrinth bilaterally. No mass or effusions. IMPRESSION: 1. No evidence of acute abnormality or abnormal enhancement. Electronically signed by: Gilmore Molt MD 10/25/2024 09:45 PM EST RP Workstation: [id number]   MR BRAIN WO CONTRAST Result Date: 10/25/2024 EXAM: MRI BRAIN WITHOUT CONTRAST 10/25/2024 07:25:57 PM TECHNIQUE: Multiplanar multisequence MRI of the head/brain was performed without the administration of intravenous contrast. COMPARISON: CT head without contrast 10/25/2024. CLINICAL HISTORY: Headache, fever; sensory hearing loss bilaterally, headache, left sided paresthesia. FINDINGS: BRAIN AND VENTRICLES:  No acute infarct. No intracranial hemorrhage. No mass. No midline shift. No hydrocephalus. The sella is unremarkable. Normal flow voids. ORBITS: No acute abnormality. SINUSES AND  MASTOIDS: No acute abnormality. BONES AND SOFT TISSUES: Normal marrow signal. No acute soft tissue abnormality. IMPRESSION: 1. No acute intracranial abnormality. Electronically signed by: Lonni Necessary MD 10/25/2024 07:34 PM EST RP Workstation: [id number]   CT Head Wo Contrast Result Date: 10/25/2024 EXAM: CT HEAD WITHOUT CONTRAST 10/25/2024 03:21:40 PM TECHNIQUE: CT of the head was performed without the administration of intravenous contrast. Automated exposure control, iterative reconstruction, and/or weight based adjustment of the mA/kV was utilized to reduce the radiation dose to as low as reasonably achievable. COMPARISON: None available. CLINICAL HISTORY: Headache, neuro deficit, left sided numbness. FINDINGS: BRAIN AND VENTRICLES: There is no evidence of an acute infarct, intracranial hemorrhage, mass, midline shift, hydrocephalus, or extra-axial fluid collection. Cerebral volume is normal. ORBITS: Bilateral cataract extraction. No acute abnormality. SINUSES: No acute abnormality. SOFT TISSUES AND SKULL: No acute soft tissue abnormality. No skull fracture. IMPRESSION: 1. Negative head CT. Electronically signed by: Dasie Hamburg MD 10/25/2024 03:28 PM EST RP Workstation: [id number]       Assessment and Plan: No notes have been filed under this hospital service. Service: Hospitalist  43 y.o. female with medical history significant of T2DM,  CAD s/p PCI, OSA, HFpEF, HTN, migraines, presenting to the emergency department for evaluation of sudden onset hearing loss this morning with associated fever and headache, preceded by viral syndrome and increase in loop diuretic dosing.   Neurology consulted in the ED. On empiric meningitis coverage, pending LP in the AM  Sudden onset hearing loss  Headache  - CT head and MRI with and without contrast brain without acute intracranial abnormality. COVID/flu/RSV negative.  -  Neurology consult appreciated.   - empiric meningitis treatment with  vancomycin , acyclovir, rocephin  - decadron    Droplet precautions - f/u UDS and blood cultures  - LP to be performed fluoroscopically tomorrow morning  - discontinue torsemide   - NPO midnight   AKI  - Cr 2.08, with baseline around 1.  - holding diuretic as above.  - f/u am labs   CAD s/p PCI  HFpEF  HTN  - stable, appears euvolemic  - hold torsemide  as above  - continue GDMT when medications reconciled   T2DM - SSI for now   OSA on nightly CPAP- continue at bedtime CPAP, daughter instructed to bring machine from home.   Heparin  dvt ppx (to start after LP in the am)  No ivf given HFpEF, euvolemic  Monitor/replace electrolytes  NPO  Advance Care Planning:   Code Status: Full Code Discussed with patient at the time of admission   Consults: Neurology   Severity of Illness: The appropriate patient status for this patient is INPATIENT. Inpatient status is judged to be reasonable and necessary in order to provide the required intensity of service to ensure the patient's safety. The patient's presenting symptoms, physical exam findings, and initial radiographic and laboratory data in the context of their chronic comorbidities is felt to place them at high risk for further clinical deterioration. Furthermore, it is not anticipated that the patient will be medically stable for discharge from the hospital within 2 midnights of admission.   * I certify that at the point of admission it is my clinical judgment that the patient will require inpatient hospital care spanning beyond 2 midnights from the point of admission due to high intensity of service, high risk for further  deterioration and high frequency of surveillance required.*  Author: Daved JAYSON Pump, DO 10/25/2024 10:32 PM  For on call review [email or web address].

## 2024-10-25 NOTE — ED Provider Notes (Signed)
 [place] EMERGENCY DEPARTMENT AT [facility] Provider Note   CSN: [id number] Arrival date & time: 10/25/24  1432     Patient presents with: No chief complaint on file.   Lindsay [name] is a 43 y.o. female w/ hx of CAD, obesity, presenting to the ED with 6 days of headache and abrupt onset hearing loss.  Patient suffers from migraines and has had a global headache for 6 days. There was a sick contact with URI in the house last week and patient did have cold symptoms then which resolved.  She has had intermittent fevers and chills.  Last night her daughter says the patient woke up speaking in tongues and complained of ringing in both her ears, and this morning stated she could not hear out of her ears.    The patient can read lips.   HPI     Prior to Admission medications   Medication Sig Start Date End Date Taking? Authorizing Provider  acetaminophen  (TYLENOL ) 500 MG tablet Take 1,000 mg by mouth in the morning and at bedtime.   Yes [provider]  albuterol  (VENTOLIN  HFA) 108 (90 Base) MCG/ACT inhaler Inhale 2 puffs into the lungs every 6 (six) hours as needed for wheezing or shortness of breath. 03/15/24  Yes Celestia Rosaline SQUIBB, NP  atorvastatin  (LIPITOR ) 20 MG tablet Take 20 mg by mouth daily.   Yes [provider]  DULoxetine  (CYMBALTA ) 30 MG capsule Take 1 capsule (30 mg total) by mouth 2 (two) times daily. 05/03/24  Yes Celestia Rosaline SQUIBB, NP  Elderberry-Vitamin C-Zinc (ELDERBERRY IMMUNE HEALTH GUMMY PO) Take 2 tablets by mouth daily.   Yes [provider]  famotidine  (PEPCID ) 20 MG tablet Take 1 tablet (20 mg total) by mouth 2 (two) times daily. Patient taking differently: Take 20 mg by mouth daily as needed for heartburn. 06/13/24  Yes Geiple, Joshua, PA-C  gabapentin  (NEURONTIN ) 300 MG capsule Take 2 capsules (600 mg total) by mouth 3 (three) times daily. Patient taking differently: Take 600 mg by mouth 2 (two) times daily. 09/01/24   Yes Celestia Rosaline SQUIBB, NP  HUMULIN  R U-500 KWIKPEN 500 UNIT/ML KwikPen Inject 75-85 Units into the skin in the morning, at noon, and at bedtime. Inject 85 units into the skin in the morning, 85 units in the afternoon and 75-80 units at night 10/17/24  Yes [provider]  hydrALAZINE  (APRESOLINE ) 100 MG tablet TAKE 1 TABLET(100 MG) BY MOUTH THREE TIMES DAILY 10/11/24  Yes Celestia Rosaline SQUIBB, NP  LANTUS  SOLOSTAR 100 UNIT/ML Solostar Pen SMARTSIG:25 Unit(s) SUB-Q Every Morning 09/18/24  Yes [provider]  losartan  (COZAAR ) 100 MG tablet TAKE 1 TABLET(100 MG) BY MOUTH DAILY 05/19/24  Yes Chandrasekhar, Mahesh A, MD  MOUNJARO 5 MG/0.5ML Pen Inject 5 mg into the skin once a week. 12/20/23  Yes [provider]  nitroGLYCERIN  (NITROSTAT ) 0.4 MG SL tablet Place 1 tablet (0.4 mg total) under the tongue every 5 (five) minutes as needed for chest pain. Please keep upcoming appt in October 2022. Final Attempt 05/03/24  Yes Celestia Rosaline SQUIBB, NP  ondansetron  (ZOFRAN ) 4 MG tablet Take 1 tablet (4 mg total) by mouth every 6 (six) hours. 10/22/24  Yes Randol Simmonds, MD  oxyCODONE -acetaminophen  (PERCOCET) 10-325 MG tablet Take 1 tablet by mouth 3 (three) times daily as needed. 10/13/24  Yes [provider]  Phenyleph-Doxylamine-DM-APAP (NYQUIL SEVERE COLD/FLU) 5-6.25-10-325 MG/15ML LIQD Take 30 mLs by mouth See admin instructions. Take 30ml by mouth every 6  to 8 hours as needed for cold/flu symptoms   Yes [provider]  tiZANidine  (ZANAFLEX ) 4 MG tablet Take 1 tablet (4 mg total) by mouth every 6 (six) hours as needed for muscle spasms. 09/08/24  Yes Celestia Rosaline SQUIBB, NP  torsemide  (DEMADEX ) 20 MG tablet Take 2 tablets (40 mg total) by mouth 2 (two) times daily. 03/15/24  Yes Celestia Rosaline SQUIBB, NP  allopurinol (ZYLOPRIM) 100 MG tablet Take 100 mg by mouth daily. Patient not taking: Reported on 08/03/2024    [provider]  aspirin  81 MG tablet Take 1 tablet (81  mg total) by mouth daily. Patient not taking: Reported on 10/25/2024 01/13/18   Kristy Sharolyn Lenis, PA-C  colchicine 0.6 MG tablet Take 0.6 mg by mouth daily as needed (Gout). Patient not taking: Reported on 10/25/2024 03/20/24   [provider]  isosorbide  mononitrate (IMDUR ) 60 MG 24 hr tablet Take 1 tablet (60 mg total) by mouth daily. Patient not taking: Reported on 10/25/2024 03/09/23   Santo Stanly LABOR, MD    Allergies: Hydrazine yellow [fd&c yellow #5 (tartrazine)], Lisinopril , Pineapple, and Pineapple extract    Review of Systems  Updated Vital Signs BP 111/60   Pulse 99   Temp 98.9 F (37.2 C) (Oral)   Resp 20   SpO2 96%   Physical Exam Constitutional:      General: She is not in acute distress. HENT:     Head: Normocephalic and atraumatic.     Right Ear: Tympanic membrane and ear canal normal.     Left Ear: Tympanic membrane and ear canal normal.     Ears:     Comments: No hearing to voice Eyes:     Conjunctiva/sclera: Conjunctivae normal.     Pupils: Pupils are equal, round, and reactive to light.  Cardiovascular:     Rate and Rhythm: Normal rate and regular rhythm.  Pulmonary:     Effort: Pulmonary effort is normal. No respiratory distress.  Abdominal:     General: There is no distension.     Tenderness: There is no abdominal tenderness.  Musculoskeletal:     Cervical back: Normal range of motion and neck supple. No rigidity.  Skin:    General: Skin is warm and dry.  Neurological:     General: No focal deficit present.     Mental Status: She is alert. Mental status is at baseline.  Psychiatric:        Mood and Affect: Mood normal.        Behavior: Behavior normal.     (all labs ordered are listed, but only abnormal results are displayed) Labs Reviewed  CBC WITH DIFFERENTIAL/PLATELET - Abnormal; Notable for the following components:      Result Value   Hemoglobin 10.6 (*)    HCT 35.9 (*)    MCH 25.2 (*)    MCHC 29.5 (*)    All other  components within normal limits  COMPREHENSIVE METABOLIC PANEL WITH GFR - Abnormal; Notable for the following components:   Chloride 94 (*)    Glucose, Bld 243 (*)    BUN 22 (*)    Creatinine, Ser 2.08 (*)    Total Protein 8.2 (*)    Albumin 3.1 (*)    GFR, Estimated 30 (*)    All other components within normal limits  URINALYSIS, ROUTINE W REFLEX MICROSCOPIC - Abnormal; Notable for the following components:   APPearance HAZY (*)    Protein, ur 100 (*)    Leukocytes,Ua  TRACE (*)    Bacteria, UA RARE (*)    All other components within normal limits  CBG MONITORING, ED - Abnormal; Notable for the following components:   Glucose-Capillary 297 (*)    All other components within normal limits  CBG MONITORING, ED - Abnormal; Notable for the following components:   Glucose-Capillary 101 (*)    All other components within normal limits  RESP PANEL BY RT-PCR (RSV, FLU A&B, COVID)  RVPGX2  CULTURE, BLOOD (SINGLE)  CSF CULTURE W GRAM STAIN  CSF CELL COUNT WITH DIFFERENTIAL  CSF CELL COUNT WITH DIFFERENTIAL  PROTEIN AND GLUCOSE, CSF  HIV ANTIBODY (ROUTINE TESTING W REFLEX)  OLIGOCLONAL BANDS, CSF + SERM  DRAW EXTRA CLOT TUBE  MENINGITIS/ENCEPHALITIS PANEL (CSF)  PREGNANCY, URINE  RAPID URINE DRUG SCREEN, HOSP PERFORMED  HEMOGLOBIN A1C  CBC  CREATININE, SERUM  CBC  PROCALCITONIN  BASIC METABOLIC PANEL WITH GFR    EKG: None  Radiology: MR BRAIN W CONTRAST Result Date: 10/25/2024 EXAM: MR Brain with Intravenous Contrast. CLINICAL HISTORY: Hearing loss, headache, encephalitis evaluation. TECHNIQUE: Magnetic resonance images of the brain with intravenous contrast in multiple planes. CONTRAST: With; 10mL (gadobutrol (GADAVIST) 1 MMOL/ML injection 10 mL GADOBUTROL 1 MMOL/ML IV SOLN). COMPARISON: Same day MRI head and CT head FINDINGS: Only IAC and postcontrast imaging performed to further evaluate after MRI head done earlier today. Normal enhancement. On motion-limited evaluation, no  obvious mass along the course of the 7th and 8th cranial nerves. Normal csf signal within the labyrinth bilaterally. No mass or effusions. IMPRESSION: 1. No evidence of acute abnormality or abnormal enhancement. Electronically signed by: Gilmore Molt MD 10/25/2024 09:45 PM EST RP Workstation: [id number]   MR BRAIN WO CONTRAST Result Date: 10/25/2024 EXAM: MRI BRAIN WITHOUT CONTRAST 10/25/2024 07:25:57 PM TECHNIQUE: Multiplanar multisequence MRI of the head/brain was performed without the administration of intravenous contrast. COMPARISON: CT head without contrast 10/25/2024. CLINICAL HISTORY: Headache, fever; sensory hearing loss bilaterally, headache, left sided paresthesia. FINDINGS: BRAIN AND VENTRICLES: No acute infarct. No intracranial hemorrhage. No mass. No midline shift. No hydrocephalus. The sella is unremarkable. Normal flow voids. ORBITS: No acute abnormality. SINUSES AND MASTOIDS: No acute abnormality. BONES AND SOFT TISSUES: Normal marrow signal. No acute soft tissue abnormality. IMPRESSION: 1. No acute intracranial abnormality. Electronically signed by: Lonni Necessary MD 10/25/2024 07:34 PM EST RP Workstation: [id number]   CT Head Wo Contrast Result Date: 10/25/2024 EXAM: CT HEAD WITHOUT CONTRAST 10/25/2024 03:21:40 PM TECHNIQUE: CT of the head was performed without the administration of intravenous contrast. Automated exposure control, iterative reconstruction, and/or weight based adjustment of the mA/kV was utilized to reduce the radiation dose to as low as reasonably achievable. COMPARISON: None available. CLINICAL HISTORY: Headache, neuro deficit, left sided numbness. FINDINGS: BRAIN AND VENTRICLES: There is no evidence of an acute infarct, intracranial hemorrhage, mass, midline shift, hydrocephalus, or extra-axial fluid collection. Cerebral volume is normal. ORBITS: Bilateral cataract extraction. No acute abnormality. SINUSES: No acute abnormality. SOFT TISSUES AND SKULL: No acute  soft tissue abnormality. No skull fracture. IMPRESSION: 1. Negative head CT. Electronically signed by: Dasie Hamburg MD 10/25/2024 03:28 PM EST RP Workstation: [id number]     Procedures   Medications Ordered in the ED  dexamethasone  (DECADRON ) injection 10 mg (10 mg Intravenous Given 10/25/24 1952)  vancomycin  (VANCOCIN ) 2,500 mg in sodium chloride  0.9 % 500 mL IVPB (2,500 mg Intravenous New Bag/Given 10/25/24 2248)  acyclovir (ZOVIRAX) 1,000 mg in dextrose  5 % 250 mL IVPB (has no administration in time range)  0.9 %  sodium chloride  infusion ( Intravenous New Bag/Given 10/25/24 1950)  oxyCODONE  (Oxy IR/ROXICODONE ) immediate release tablet 10 mg (10 mg Oral Given 10/25/24 2011)  insulin  aspart (novoLOG ) injection 0-9 Units (has no administration in time range)  insulin  aspart (novoLOG ) injection 0-5 Units (has no administration in time range)  heparin  injection 5,000 Units (has no administration in time range)  acetaminophen  (TYLENOL ) tablet 650 mg (has no administration in time range)    Or  acetaminophen  (TYLENOL ) suppository 650 mg (has no administration in time range)  ondansetron  (ZOFRAN ) tablet 4 mg (has no administration in time range)    Or  ondansetron  (ZOFRAN ) injection 4 mg (has no administration in time range)  cefTRIAXone  (ROCEPHIN ) 2 g in sodium chloride  0.9 % 100 mL IVPB (has no administration in time range)  ketorolac  (TORADOL ) 30 MG/ML injection 30 mg (30 mg Intravenous Given 10/25/24 1716)  cefTRIAXone  (ROCEPHIN ) 2 g in sodium chloride  0.9 % 100 mL IVPB (0 g Intravenous Stopped 10/25/24 2153)  gadobutrol (GADAVIST) 1 MMOL/ML injection 10 mL (10 mLs Intravenous Contrast Given 10/25/24 2107)    Clinical Course as of 10/25/24 2249  Wed Oct 25, 2024  1743 Neurology PA at bedside, discussing MRI and LP with patient and daughter.  I also had this discussion.  Patient is willing to attempt bedside LP but I'm afraid this will likely be quite limited by her significant body habitus [MT]   1902 Neurology had recommended MRI w/ contrast brain and attempted LP, empiric treatment for CNS infection should not be delayed - ordered now [MT]  1939 MRI technician megan reports the patient's IV was nonfunction at MRI so they cancelled the contrasted study and ordered the noncontrasted scan.  This scan is unremarkable.  I'll discuss with neurology but she may still need a contrasted study [MT]  2101 Admitted to hospitalist. Patient will return for MRI with contrast, per my discussion with neurologist [MT]    Clinical Course User Index [MT] Leidi Astle, Donnice PARAS, MD                                 Medical Decision Making Amount and/or Complexity of Data Reviewed Labs: ordered. Radiology: ordered.  Risk Prescription drug management. Decision regarding hospitalization.   This patient presents to the ED with concern for headache, hearing loss, fevers. This involves an extensive number of treatment options, and is a complaint that carries with it a high risk of complications and morbidity.  The differential diagnosis includes CNS infection versus complex migraine versus medication side effect versus other  Additional history obtained from patient's daughter at bedside  External records from outside source obtained and reviewed including recent ED visit for peripheral edema, shortness of breath, 3 days ago, with increase of patient's diuretic dosing.  She does take aspirin  daily, as prescribed, has not taken larger amounts that would raise immediate concern for ototoxicity.  She has not on aminoglycoside antibiotics.  I ordered and personally interpreted labs.  The pertinent results include: Hyperglycemia, chronic kidney disease, no evidence of DKA.  No ketones in the urine.  No clear evidence of UTI.  White blood cell count is normal.  I ordered imaging studies including CT head, MRI of the brain I independently visualized and interpreted imaging which showed no emergent findings I agree  with the radiologist interpretation   I ordered medication including chronic home pain medicine, oxycodone  10 mg, which he takes for  chronic back pain.  IV acyclovir per pharmacy, IV vanco + rocephin  for CNS infection BS coverage  I have reviewed the patients home medicines and have made adjustments as needed  Test Considered:   LP was considered and recommended but patient deferred at this time, in favor of fluoroscopic LP in the morning.  I think this is likely a necessity given her body habitus, severe obesity, which makes her anatomical landmarks virtually impossible to palpate.    MRI w/ CONTRAST brain was originally ordered, but subsequently changed to noncontrasted study by MRI technicians due to IV malfunction. Repeat MRI with contrast shows no emergent findings  Patient reassessed and remains comfortable appearing in the room.  Still has hearing loss, but no confusion, answers appropriately to questions.  I requested consultation with the neurology,  and discussed lab and imaging findings as well as pertinent plan - they recommend: empiric treatment for CNS infection, neuro imaging, LP  After the interventions noted above, I reevaluated the patient and found that they have: improved   Disposition:  After consideration of the diagnostic results and the patients response to treatment, I feel that the patent would benefit from medical admission      Final diagnoses:  Bilateral hearing loss, unspecified hearing loss type  Hyperglycemia  Nonintractable headache, unspecified chronicity pattern, unspecified headache type  Fever, unspecified fever cause    ED Discharge Orders     None          Cottie Donnice PARAS, MD 10/25/24 2250

## 2024-10-25 NOTE — ED Triage Notes (Addendum)
 Pt presenting with acute hearing loss around 0300. Pt c/o headache and light sensitivity, has hx of migraines. Pt rubbing head in triage. Pt also having numbness and decreased sensation on left side.

## 2024-10-25 NOTE — Progress Notes (Signed)
 Pharmacy Antibiotic Note  Lindsay [name] is a 43 y.o. female admitted on 10/25/2024 with meningitis.  Pharmacy has been consulted for vancomycin  and acyclovir dosing.  Plan: Acyclovir 1 g q8h Vancomycin  1500 mg q24h     Temp (24hrs), Avg:99.7 F (37.6 C), Min:98.9 F (37.2 C), Max:100.4 F (38 C)  Recent Labs  Lab 10/22/24 1523 10/25/24 1629  WBC 8.4 9.8  CREATININE 1.31* 2.08*    Estimated Creatinine Clearance: 57.6 mL/min (A) (by C-G formula based on SCr of 2.08 mg/dL (H)).    Allergies  Allergen Reactions   Hydrazine Yellow [Fd&C Yellow #5 (Tartrazine)] Shortness Of Breath and Swelling    Swelling mostly noticed in legs and feet, retaining urination, shortness of breaht, and minor chest pain   Lisinopril  Shortness Of Breath    Was on prinzide ; had sob/chest pain on it.   Pineapple Itching   Pineapple Extract     Antimicrobials this admission: Ceftriaxone  11/5 >>   Acyclovir 11/5 >>  Vancomycin  11/5 >>   Microbiology results: 11/5 BCx:  11/5 CSF   Thank you for allowing pharmacy to be a part of this patient's care.  Ariam Mol M Kristin Barcus 10/25/2024 11:11 PM

## 2024-10-26 ENCOUNTER — Inpatient Hospital Stay (HOSPITAL_COMMUNITY)

## 2024-10-26 ENCOUNTER — Other Ambulatory Visit (INDEPENDENT_AMBULATORY_CARE_PROVIDER_SITE_OTHER): Payer: Self-pay | Admitting: Primary Care

## 2024-10-26 DIAGNOSIS — R9431 Abnormal electrocardiogram [ECG] [EKG]: Secondary | ICD-10-CM

## 2024-10-26 DIAGNOSIS — E118 Type 2 diabetes mellitus with unspecified complications: Secondary | ICD-10-CM

## 2024-10-26 DIAGNOSIS — H903 Sensorineural hearing loss, bilateral: Secondary | ICD-10-CM

## 2024-10-26 LAB — CBC
HCT: 32.8 % — ABNORMAL LOW (ref 36.0–46.0)
HCT: 33.5 % — ABNORMAL LOW (ref 36.0–46.0)
Hemoglobin: 10.2 g/dL — ABNORMAL LOW (ref 12.0–15.0)
Hemoglobin: 10.3 g/dL — ABNORMAL LOW (ref 12.0–15.0)
MCH: 25.8 pg — ABNORMAL LOW (ref 26.0–34.0)
MCH: 25.9 pg — ABNORMAL LOW (ref 26.0–34.0)
MCHC: 30.7 g/dL (ref 30.0–36.0)
MCHC: 31.1 g/dL (ref 30.0–36.0)
MCV: 83 fL (ref 80.0–100.0)
MCV: 84.2 fL (ref 80.0–100.0)
Platelets: 248 K/uL (ref 150–400)
Platelets: 291 K/uL (ref 150–400)
RBC: 3.95 MIL/uL (ref 3.87–5.11)
RBC: 3.98 MIL/uL (ref 3.87–5.11)
RDW: 14.6 % (ref 11.5–15.5)
RDW: 14.6 % (ref 11.5–15.5)
WBC: 7 K/uL (ref 4.0–10.5)
WBC: 9.2 K/uL (ref 4.0–10.5)
nRBC: 0 % (ref 0.0–0.2)
nRBC: 0 % (ref 0.0–0.2)

## 2024-10-26 LAB — CSF CELL COUNT WITH DIFFERENTIAL
RBC Count, CSF: 147 /mm3 — ABNORMAL HIGH
RBC Count, CSF: 15 /mm3 — ABNORMAL HIGH
Tube #: 1
Tube #: 4
WBC, CSF: 2 /mm3 (ref 0–5)
WBC, CSF: 2 /mm3 (ref 0–5)

## 2024-10-26 LAB — GLUCOSE, CAPILLARY
Glucose-Capillary: 363 mg/dL — ABNORMAL HIGH (ref 70–99)
Glucose-Capillary: 414 mg/dL — ABNORMAL HIGH (ref 70–99)
Glucose-Capillary: 423 mg/dL — ABNORMAL HIGH (ref 70–99)
Glucose-Capillary: 525 mg/dL (ref 70–99)
Glucose-Capillary: 535 mg/dL (ref 70–99)

## 2024-10-26 LAB — BASIC METABOLIC PANEL WITH GFR
Anion gap: 13 (ref 5–15)
Anion gap: 12 (ref 5–15)
BUN: 27 mg/dL — ABNORMAL HIGH (ref 6–20)
BUN: 27 mg/dL — ABNORMAL HIGH (ref 6–20)
CO2: 26 mmol/L (ref 22–32)
CO2: 25 mmol/L (ref 22–32)
Calcium: 8.3 mg/dL — ABNORMAL LOW (ref 8.9–10.3)
Calcium: 8.5 mg/dL — ABNORMAL LOW (ref 8.9–10.3)
Chloride: 94 mmol/L — ABNORMAL LOW (ref 98–111)
Chloride: 96 mmol/L — ABNORMAL LOW (ref 98–111)
Creatinine, Ser: 1.72 mg/dL — ABNORMAL HIGH (ref 0.44–1.00)
Creatinine, Ser: 1.66 mg/dL — ABNORMAL HIGH (ref 0.44–1.00)
GFR, Estimated: 37 mL/min — ABNORMAL LOW (ref >60)
GFR, Estimated: 39 mL/min — ABNORMAL LOW (ref >60)
Glucose, Bld: 369 mg/dL — ABNORMAL HIGH (ref 70–99)
Glucose, Bld: 479 mg/dL — ABNORMAL HIGH (ref 70–99)
Potassium: 5.7 mmol/L — ABNORMAL HIGH (ref 3.5–5.1)
Potassium: 5.2 mmol/L — ABNORMAL HIGH (ref 3.5–5.1)
Sodium: 133 mmol/L — ABNORMAL LOW (ref 135–145)
Sodium: 133 mmol/L — ABNORMAL LOW (ref 135–145)

## 2024-10-26 LAB — CREATININE, SERUM
Creatinine, Ser: 2.15 mg/dL — ABNORMAL HIGH (ref 0.44–1.00)
GFR, Estimated: 29 mL/min — ABNORMAL LOW (ref >60)

## 2024-10-26 LAB — BLOOD CULTURE ID PANEL (REFLEXED) - BCID2

## 2024-10-26 LAB — PROTEIN AND GLUCOSE, CSF
Glucose, CSF: 171 mg/dL — ABNORMAL HIGH (ref 40–70)
Total  Protein, CSF: 29 mg/dL (ref 15–45)

## 2024-10-26 LAB — HIV ANTIBODY (ROUTINE TESTING W REFLEX): HIV Screen 4th Generation wRfx: NONREACTIVE

## 2024-10-26 LAB — MENINGITIS/ENCEPHALITIS PANEL (CSF)

## 2024-10-26 LAB — ECHOCARDIOGRAM COMPLETE: S' Lateral: 2.8 cm

## 2024-10-26 LAB — HEMOGLOBIN A1C
Hgb A1c MFr Bld: 10.1 % — ABNORMAL HIGH (ref 4.8–5.6)
Mean Plasma Glucose: 243.17 mg/dL

## 2024-10-26 LAB — PROCALCITONIN: Procalcitonin: 0.18 ng/mL

## 2024-10-26 LAB — CRYPTOCOCCAL ANTIGEN, CSF: Crypto Ag: NEGATIVE

## 2024-10-26 MED ORDER — INSULIN ASPART 100 UNIT/ML IJ SOLN
20.0000 [IU] | Freq: Once | INTRAMUSCULAR | Status: AC
  Administered 2024-10-27: 20 [IU] via SUBCUTANEOUS
  Filled 2024-10-26: qty 20

## 2024-10-26 MED ORDER — INSULIN ASPART 100 UNIT/ML IJ SOLN
0.0000 [IU] | INTRAMUSCULAR | Status: DC
  Administered 2024-10-26: 20 [IU] via SUBCUTANEOUS
  Administered 2024-10-27 (×2): 15 [IU] via SUBCUTANEOUS
  Administered 2024-10-27: 11 [IU] via SUBCUTANEOUS
  Administered 2024-10-27 (×2): 15 [IU] via SUBCUTANEOUS
  Administered 2024-10-28: 4 [IU] via SUBCUTANEOUS
  Administered 2024-10-28: 7 [IU] via SUBCUTANEOUS
  Administered 2024-10-28 (×2): 4 [IU] via SUBCUTANEOUS
  Administered 2024-10-28: 3 [IU] via SUBCUTANEOUS
  Administered 2024-10-28: 5 [IU] via SUBCUTANEOUS
  Administered 2024-10-29: 3 [IU] via SUBCUTANEOUS
  Administered 2024-10-29 (×2): 11 [IU] via SUBCUTANEOUS
  Administered 2024-10-30: 4 [IU] via SUBCUTANEOUS
  Administered 2024-10-30: 11 [IU] via SUBCUTANEOUS
  Filled 2024-10-26: qty 3
  Filled 2024-10-26: qty 5
  Filled 2024-10-26: qty 4
  Filled 2024-10-26: qty 7
  Filled 2024-10-26: qty 4
  Filled 2024-10-26: qty 15
  Filled 2024-10-26: qty 20
  Filled 2024-10-26: qty 4
  Filled 2024-10-26: qty 11
  Filled 2024-10-26: qty 4
  Filled 2024-10-26: qty 15
  Filled 2024-10-26: qty 11
  Filled 2024-10-26: qty 15
  Filled 2024-10-26: qty 4
  Filled 2024-10-26: qty 11
  Filled 2024-10-26: qty 4
  Filled 2024-10-26: qty 3
  Filled 2024-10-26: qty 15
  Filled 2024-10-26: qty 11

## 2024-10-26 MED ORDER — INSULIN REGULAR HUMAN (CONC) 500 UNIT/ML ~~LOC~~ SOPN
25.0000 [IU] | PEN_INJECTOR | Freq: Three times a day (TID) | SUBCUTANEOUS | Status: DC
  Administered 2024-10-26 – 2024-10-27 (×4): 25 [IU] via SUBCUTANEOUS
  Filled 2024-10-26 (×2): qty 3

## 2024-10-26 MED ORDER — INSULIN GLARGINE-YFGN 100 UNIT/ML ~~LOC~~ SOLN
10.0000 [IU] | Freq: Every day | SUBCUTANEOUS | Status: DC
  Filled 2024-10-26: qty 0.1

## 2024-10-26 MED ORDER — LIDOCAINE HCL (PF) 1 % IJ SOLN
5.0000 mL | Freq: Once | INTRAMUSCULAR | Status: AC
  Administered 2024-10-26: 5 mL via INTRADERMAL
  Filled 2024-10-26: qty 5

## 2024-10-26 MED ORDER — FAMOTIDINE 20 MG PO TABS
20.0000 mg | ORAL_TABLET | Freq: Two times a day (BID) | ORAL | Status: DC
  Administered 2024-10-26 – 2024-10-30 (×9): 20 mg via ORAL
  Filled 2024-10-26 (×9): qty 1

## 2024-10-26 MED ORDER — INSULIN ASPART 100 UNIT/ML IJ SOLN
0.0000 [IU] | Freq: Three times a day (TID) | INTRAMUSCULAR | Status: DC
  Administered 2024-10-26 (×2): 20 [IU] via SUBCUTANEOUS
  Filled 2024-10-26 (×2): qty 20

## 2024-10-26 MED ORDER — LIVING WELL WITH DIABETES BOOK
Freq: Once | Status: AC
  Filled 2024-10-26: qty 1

## 2024-10-26 MED ORDER — PERFLUTREN LIPID MICROSPHERE
1.0000 mL | INTRAVENOUS | Status: AC | PRN
  Administered 2024-10-26: 2 mL via INTRAVENOUS

## 2024-10-26 MED ORDER — GABAPENTIN 300 MG PO CAPS
600.0000 mg | ORAL_CAPSULE | Freq: Two times a day (BID) | ORAL | Status: DC
  Administered 2024-10-26 – 2024-10-27 (×4): 600 mg via ORAL
  Filled 2024-10-26 (×4): qty 2

## 2024-10-26 MED ORDER — INSULIN GLARGINE 100 UNITS/ML SOLOSTAR PEN: 10.0000 [IU] | PEN_INJECTOR | SUBCUTANEOUS | Status: DC

## 2024-10-26 MED ORDER — DULOXETINE HCL 30 MG PO CPEP
30.0000 mg | ORAL_CAPSULE | Freq: Two times a day (BID) | ORAL | Status: DC
  Administered 2024-10-26 – 2024-10-30 (×9): 30 mg via ORAL
  Filled 2024-10-26 (×10): qty 1

## 2024-10-26 MED ORDER — ATORVASTATIN CALCIUM 10 MG PO TABS
20.0000 mg | ORAL_TABLET | Freq: Every day | ORAL | Status: DC
  Administered 2024-10-26 – 2024-10-30 (×5): 20 mg via ORAL
  Filled 2024-10-26 (×5): qty 2

## 2024-10-26 NOTE — Progress Notes (Signed)
 PROGRESS NOTE  Lindsay [name]  DOB: Jun 13, 1981  PCP: Celestia Rosaline SQUIBB, NP FMW:[id number]  DOA: 10/25/2024  LOS: 1 day  Hospital Day: 2  Subjective: Patient was seen and examined this morning. Young morbidly obese African-American female.  Looks older for age. Alert, awake, cannot hear and hence communicates by writing.  Comprehends and verbalizes clearly.  Remains afebrile since initial temperature of 100.4, hemodynamically stable Labs this morning with WBC count normal, potassium 5.7, sodium 133, creatinine better at 1.72, glucose 369.  Brief narrative: LINLEE CROMIE is a 43 y.o. female with PMH significant for DM2, HTN, HLD, morbid obesity, OSA, CAD s/p stent, CHF, anxiety/depression, chronic anemia, arthritis. 11/5, patient presented to ED for evaluation of sudden onset bilateral hearing loss.  On 10/31, patient began feeling sick with chest pain, fluid retention in the legs, headache. She was seen in the ED on 11/2, charged home on increased dose of torsemide .  She continued to have headache and intermittent fever. On 11/5, she woke up at 3 AM 'screaming and speaking in tongues' and then everything went silent.  While this was going on, she was having ringing in her ears that sound like loud church bells.  Everything then went completely quiet.  Around 10 AM, she noticed that the left side of her face, left arm and left leg had numbness.  In the ED, she had a fever of 100.4, heart rate 97, blood pressure 141/74, breathing on room air. Labs with WC count 9.8, hemoglobin 10.6, BUN/creatinine 22/2.08, glucose 243 Respiratory virus panel unremarkable. Blood culture sent CT head unremarkable MRI brain with and without contrast did not show any evidence of acute abnormality or abnormal enhancement. Seen by neurology in the ED  Recommended LP and empiric meningitis coverage. Patient apparently refused LP in the ED and was planned for fluoroscopy guided LP in the  morning. Admitted to [facility].  Assessment and plan: Sudden onset bilateral hearing loss  Progressive headache  Timeline of symptoms and imagings as above  Underwent fluoroscopy guided LP by IR today.   Per neurology, CSF sample to sendt for Protein and glucose, cell count on tubes 1 and 4, meningitis/encephalitis panel, IgG index, cryptococcal antigen, VDRL  CSF glucose level elevated, protein level not elevated, others pending Currently on empiric antibiotic coverage with IV Rocephin , IV vancomycin , acyclovir as well as Decadron . Initially had temperature 100.4.  No fever.  WBC count normal. Neurology Dr. Merrianne texted me stating that patient has possible functional hearing loss and recommended audiologist Recent Labs  Lab 10/22/24 1523 10/25/24 1629 10/25/24 2356 10/26/24 0551  WBC 8.4 9.8 9.2 7.0  PROCALCITON  --   --  0.18  --    AKI Baseline creatinine less than 1 in June 2025. Presented with elevated creatinine of 2.15. Slightly better today at 1.72.  Continue to monitor Recent Labs    03/15/24 1649 04/13/24 1132 06/12/24 2248 06/12/24 2252 10/22/24 1523 10/25/24 1629 10/25/24 2356 10/26/24 0551 10/26/24 1211  BUN 19 32* 11 11 24* 22*  --  27* 27*  CREATININE 1.07* 1.73* 0.99 0.90 1.31* 2.08* 2.15* 1.72* 1.66*  CO2 27 22 26   --  27 30  --  26 25   CHF HTN Most recent echo from 2023 with EF 65 to 70%, mild LVH, RV size and function normal. Repeat echo pending. Hemodynamically stable.  Looks euvolemic. PTA meds- torsemide  40 mg twice daily, hydralazine  100 mg 3 times daily, losartan  100 mg daily.  Not on beta-blocker. Currently  all meds are on hold.  CAD s/p stent HLD PTA meds- aspirin  81 mg daily (not taking), Lipitor  20 mg daily, nitro sublingual.   Type 2 diabetes mellitus uncontrolled with hyperglycemia Diabetic neuropathy A1c 10.1 on 10/25/2024 Blood sugar level running elevated.  Patient is on high dose of dexamethasone  10 mg every 6 hours.  Guidelines  state to continue it for 2 to 4 days.  Can DC if CSF studies rule out meningitis. PTA meds-Mounjaro, Lantus  25 units daily, high-dose Premeal U-500 insulin  Diabetes care coordinator consulted Has been started on Humulin  R U-500 25 units 3 times daily with meals, monitor CBG Q4 with 0 to 20 units SSI Continue gabapentin  600 mg twice daily.   Recent Labs  Lab 10/25/24 1435 10/25/24 2209 10/25/24 2328 10/26/24 0830 10/26/24 1129  GLUCAP 297* 101* 142* 363* 414*   Mild chronic anemia GERD Hemoglobin stable Continue Pepcid  Recent Labs    06/12/24 2252 10/22/24 1523 10/25/24 1629 10/25/24 2356 10/26/24 0551  HGB 11.9* 10.2* 10.6* 10.2* 10.3*  MCV  --  85.1 85.5 83.0 84.2   Hyponatremia Mild.  Continue to monitor Recent Labs  Lab 10/22/24 1523 10/25/24 1629 10/26/24 0551 10/26/24 1211  NA 137 135 133* 133*   Hyperkalemia Potassium level elevated to 5.7 this morning.  Not on any potassium supplements or potassium sparing diuretic Repeat BMP at noon so some improvement.  Continue to monitor Recent Labs  Lab 10/22/24 1523 10/25/24 1629 10/26/24 0551 10/26/24 1211  K 4.7 4.3 5.7* 5.2*   Morbid Obesity  There is no height or weight on file to calculate BMI. Patient has been advised to make an attempt to improve diet and exercise patterns to aid in weight loss.  OSA On nocturnal CPAP  Arthritis PTA meds- Percocet 10/325 PRN, Zanaflex  PRN  Anxiety/depression PTA meds- Cymbalta  30 mg twice daily    Mobility: Encourage ambulation  Goals of care   Code Status: Full Code     DVT prophylaxis:  heparin  injection 5,000 Units Start: 10/25/24 2230   Antimicrobials: IV Rocephin , vancomycin , acyclovir Fluid: None Consultants: Neurology Family Communication: None at bedside  Status: Inpatient Level of care:  Telemetry   Patient is from: Home Needs to continue in-hospital care: Has bilateral hearing loss Anticipated d/c to: Pending clinical course   Diet:   Diet Order             Diet heart healthy/carb modified Fluid consistency: Thin  Diet effective now                   Scheduled Meds:  atorvastatin   20 mg Oral Daily   dexamethasone  (DECADRON ) injection  10 mg Intravenous Q6H   DULoxetine   30 mg Oral BID   famotidine   20 mg Oral BID   gabapentin   600 mg Oral BID   heparin   5,000 Units Subcutaneous Q8H   insulin  aspart  0-20 Units Subcutaneous TID WC   insulin  regular human CONCENTRATED  25 Units Subcutaneous TID WC    PRN meds: acetaminophen  **OR** acetaminophen , ondansetron  **OR** ondansetron  (ZOFRAN ) IV, oxyCODONE , perflutren  lipid microspheres (DEFINITY ) IV suspension   Infusions:   sodium chloride  125 mL/hr at 10/26/24 0430   acyclovir (ZOVIRAX) 1,000 mg in dextrose  5 % 250 mL IVPB 1,000 mg (10/26/24 0829)   cefTRIAXone  (ROCEPHIN )  IV 2 g (10/26/24 1231)   vancomycin       Antimicrobials: Anti-infectives (From admission, onward)    Start     Dose/Rate Route Frequency Ordered Stop  10/26/24 2000  cefTRIAXone  (ROCEPHIN ) 2 g in sodium chloride  0.9 % 100 mL IVPB  Status:  Discontinued        2 g 200 mL/hr over 30 Minutes Intravenous Every 24 hours 10/25/24 2228 10/25/24 2238   10/26/24 2000  vancomycin  (VANCOREADY) IVPB 1500 mg/300 mL        1,500 mg 150 mL/hr over 120 Minutes Intravenous Every 24 hours 10/25/24 2310     10/26/24 1000  cefTRIAXone  (ROCEPHIN ) 2 g in sodium chloride  0.9 % 100 mL IVPB        2 g 200 mL/hr over 30 Minutes Intravenous Every 12 hours 10/25/24 2238     10/26/24 0800  acyclovir (ZOVIRAX) 1,000 mg in dextrose  5 % 250 mL IVPB        1,000 mg 270 mL/hr over 60 Minutes Intravenous Every 8 hours 10/25/24 2310     10/25/24 1930  vancomycin  (VANCOCIN ) 2,500 mg in sodium chloride  0.9 % 500 mL IVPB        2,500 mg 262.5 mL/hr over 120 Minutes Intravenous  Once 10/25/24 1920 10/26/24 0124   10/25/24 1930  acyclovir (ZOVIRAX) 1,000 mg in dextrose  5 % 250 mL IVPB        1,000 mg 270 mL/hr over 60  Minutes Intravenous Once 10/25/24 1921 10/26/24 0330   10/25/24 1915  cefTRIAXone  (ROCEPHIN ) 2 g in sodium chloride  0.9 % 100 mL IVPB        2 g 200 mL/hr over 30 Minutes Intravenous  Once 10/25/24 1900 10/25/24 2153   10/25/24 1915  vancomycin  (VANCOCIN ) IVPB 1000 mg/200 mL premix  Status:  Discontinued        1,000 mg 200 mL/hr over 60 Minutes Intravenous  Once 10/25/24 1901 10/25/24 1919       Objective: Vitals:   10/26/24 0811 10/26/24 1133  BP: 133/72 138/69  Pulse: 74 76  Resp: 18 18  Temp: (!) 97.5 F (36.4 C) 98.5 F (36.9 C)  SpO2: 95% 100%   No intake or output data in the 24 hours ending 10/26/24 1424 There were no vitals filed for this visit. Weight change:  There is no height or weight on file to calculate BMI.   Physical Exam: General exam: Pleasant, morbidly obese young African-American female. Skin: No rashes, lesions or ulcers. HEENT: Atraumatic, normocephalic, no obvious bleeding Lungs: Clear to auscultation bilaterally,  CVS: S1, S2, no murmur,   GI/Abd: Soft, nontender, nondistended, bowel sound present,   CNS: Alert, awake, unable to hear his communicates by writing.  Able to comprehend and verbalize Psychiatry: Mood appropriate Extremities: No pedal edema, no calf tenderness,   Data Review: I have personally reviewed the laboratory data and studies available.  F/u labs ordered Unresulted Labs (From admission, onward)     Start     Ordered   10/27/24 0500  Basic metabolic panel with GFR  Tomorrow morning,   R       Question:  Specimen collection method  Answer:  Lab=Lab collect   10/26/24 0805   10/27/24 0500  CBC with Differential/Platelet  Tomorrow morning,   R       Question:  Specimen collection method  Answer:  Lab=Lab collect   10/26/24 0805   10/26/24 1039  VDRL, CSF  RELEASE UPON ORDERING,   TIMED        10/26/24 1039   10/26/24 1039  Fungus Culture With Stain  RELEASE UPON ORDERING,   TIMED        10/26/24  1039   10/26/24 1039  Acid  Fast Culture with reflexed sensitivities  RELEASE UPON ORDERING,   TIMED        10/26/24 1039   10/26/24 1039  Acid Fast Smear (AFB)  RELEASE UPON ORDERING,   TIMED        10/26/24 1039   10/26/24 1039  Angiotensin converting enzyme, CSF  RELEASE UPON ORDERING,   TIMED        10/26/24 1039   10/25/24 2205  Rapid urine drug screen (hospital performed)  Add-on,   AD        10/25/24 2204   10/25/24 2119  Pregnancy, urine  Once,   R        10/25/24 2118   10/25/24 1902  Oligoclonal bands, CSF + serum  (Meningitis Panel)  Once,   URGENT        10/25/24 1901   10/25/24 1902  Draw extra clot tube  (Meningitis Panel)  Once,   URGENT        10/25/24 1901   10/25/24 1901  CSF cell count with differential collection tube #: 1  (Meningitis Panel)  Once,   STAT       Question:  collection tube #  Answer:  1   10/25/24 1901   10/25/24 1901  CSF cell count with differential collection tube #: 4  (Meningitis Panel)  Once,   STAT       Question:  collection tube #  Answer:  4   10/25/24 1901            Signed, Chapman Rota, MD [facility] 10/26/2024

## 2024-10-26 NOTE — Progress Notes (Signed)
 PHARMACY - PHYSICIAN COMMUNICATION CRITICAL VALUE ALERT - BLOOD CULTURE IDENTIFICATION (BCID)  Lindsay [name] is an 43 y.o. female who presented to [facility] on 10/25/2024 with a chief complaint of bilateral hearing loss.  Assessment:  1 set, 2 of 2 bottles staph epi, likely contaminant  Name of physician (or Provider) Contacted: Dr. Arlice  Current antibiotics: ceftriaxone , vancomycin , acyclovir for meningitis work up  Changes to prescribed antibiotics recommended:  Patient is on recommended antibiotics - No changes needed  Results for orders placed or performed during the hospital encounter of 10/25/24  Blood Culture ID Panel (Reflexed) (Collected: 10/25/2024  4:27 PM)  Result Value Ref Range   Enterococcus faecalis NOT DETECTED NOT DETECTED   Enterococcus Faecium NOT DETECTED NOT DETECTED   Listeria monocytogenes NOT DETECTED NOT DETECTED   Staphylococcus species DETECTED (A) NOT DETECTED   Staphylococcus aureus (BCID) NOT DETECTED NOT DETECTED   Staphylococcus epidermidis DETECTED (A) NOT DETECTED   Staphylococcus lugdunensis NOT DETECTED NOT DETECTED   Streptococcus species NOT DETECTED NOT DETECTED   Streptococcus agalactiae NOT DETECTED NOT DETECTED   Streptococcus pneumoniae NOT DETECTED NOT DETECTED   Streptococcus pyogenes NOT DETECTED NOT DETECTED   A.calcoaceticus-baumannii NOT DETECTED NOT DETECTED   Bacteroides fragilis NOT DETECTED NOT DETECTED   Enterobacterales NOT DETECTED NOT DETECTED   Enterobacter cloacae complex NOT DETECTED NOT DETECTED   Escherichia coli NOT DETECTED NOT DETECTED   Klebsiella aerogenes NOT DETECTED NOT DETECTED   Klebsiella oxytoca NOT DETECTED NOT DETECTED   Klebsiella pneumoniae NOT DETECTED NOT DETECTED   Proteus species NOT DETECTED NOT DETECTED   Salmonella species NOT DETECTED NOT DETECTED   Serratia marcescens NOT DETECTED NOT DETECTED   Haemophilus influenzae NOT DETECTED NOT DETECTED   Neisseria meningitidis NOT DETECTED NOT  DETECTED   Pseudomonas aeruginosa NOT DETECTED NOT DETECTED   Stenotrophomonas maltophilia NOT DETECTED NOT DETECTED   Candida albicans NOT DETECTED NOT DETECTED   Candida auris NOT DETECTED NOT DETECTED   Candida glabrata NOT DETECTED NOT DETECTED   Candida krusei NOT DETECTED NOT DETECTED   Candida parapsilosis NOT DETECTED NOT DETECTED   Candida tropicalis NOT DETECTED NOT DETECTED   Cryptococcus neoformans/gattii NOT DETECTED NOT DETECTED   Methicillin resistance mecA/C DETECTED (A) NOT DETECTED    Rocky Slade, PharmD, BCPS 10/26/2024  6:46 PM

## 2024-10-26 NOTE — Plan of Care (Signed)

## 2024-10-26 NOTE — Inpatient Diabetes Management (Addendum)
 Inpatient Diabetes Program Recommendations  AACE/ADA: New Consensus Statement on Inpatient Glycemic Control   Target Ranges:  Prepandial:   less than 140 mg/dL      Peak postprandial:   less than 180 mg/dL (1-2 hours)      Critically ill patients:  140 - 180 mg/dL    Latest Reference Range & Units 10/26/24 08:30  Glucose-Capillary 70 - 99 mg/dL 636 (H)    Latest Reference Range & Units 10/25/24 14:35 10/25/24 22:09 10/25/24 23:28  Glucose-Capillary 70 - 99 mg/dL 702 (H) 898 (H) 857 (H)    Latest Reference Range & Units 10/25/24 16:29 10/26/24 05:51  Glucose 70 - 99 mg/dL 756 (H) 630 (H)    Latest Reference Range & Units 04/13/24 11:32 10/25/24 23:56  Hemoglobin A1C 4.8 - 5.6 % 9.8 (H) 10.1 (H)   Review of Glycemic Control  Diabetes history: DM2 Outpatient Diabetes medications: Humulin  R U500 85 units with breakfast, 85 units with lunch, 75-80 units wit supper, Lantus  25 units QAM, Mounjaro 5 mg Qweek Current orders for Inpatient glycemic control: Semglee  10 units daily, Novolog  0-9 units TID with  meals, Novolog  0-5 units at bedtime; Decadorn 10 mg Q6H  Inpatient Diabetes Program Recommendations:    Insulin : CBG 363 mg/dl this morning.  Please consider changing CBGs to Q4H (for closer glucose monitoring), Novolog  correction to 0-20 units Q4H (for more frequent correction if needed), discontinue Semglee , and order Humulin  R U500 25 units TID with meals to start now.  NOTE: Patient admitted on 10/25/24 with sudden onset of hearing loss, headache and AKI. Per chart review, patient is taking Humulin  R U500 (concentrated insulin ) with meals, Lantus  daily, and Mounjaro once a week. Diabetes coordinator working remotely today. Sent chat message to RN to ask if patient is able to hear at all right now and RN states that patient is NOT able to hear at this time. Asked RN to ask patient if she was consistently taking Humulin  R U500 85 units with breakfast, 85 units with lunch, 75-80 units with  supper, Lantus  25 units QAM, and Mounjaro 5 mg Qweek. RN reports that patient reported she is taking U500 insulin  depending on sugar level and if she eats.    Thanks, Earnie Gainer, RN, MSN, CDCES Diabetes Coordinator Inpatient Diabetes Program [phone] (Team Pager from 8am to 5pm)

## 2024-10-26 NOTE — Procedures (Signed)
 PROCEDURE SUMMARY:  Successful fluoroscopic guided lumbar puncture.  Opening pressure was 30 cm H2O; closing pressure 23.5 cm H2O. Pressures measure in prone position as table width would not accommodate rolling to lateral decubitus due to body habitus.  ~21 mL clear colorless fluid collected and sent for labs. Puncture required 7 inch 22G spinal needle to reach thecal sac.   No immediate complications.  Pt tolerated well.   EBL = none  Please see full dictation in imaging section of Epic for procedure details.    Belina Mandile NP 10/26/2024 12:56 PM

## 2024-10-26 NOTE — Progress Notes (Signed)
 Meningitis/Encephalitis panel negative.. Blood sugar super elevated.. 535 this afternoon.. Will stop dexamethasone .SABRA

## 2024-10-26 NOTE — Plan of Care (Signed)
  Problem: Education: Goal: Ability to describe self-care measures that may prevent or decrease complications (Diabetes Survival Skills Education) will improve Outcome: Progressing Goal: Individualized Educational Video(s) Outcome: Progressing   Problem: Coping: Goal: Ability to adjust to condition or change in health will improve Outcome: Progressing   Problem: Fluid Volume: Goal: Ability to maintain a balanced intake and output will improve Outcome: Progressing   Problem: Health Behavior/Discharge Planning: Goal: Ability to identify and utilize available resources and services will improve Outcome: Progressing Goal: Ability to manage health-related needs will improve Outcome: Progressing   Problem: Metabolic: Goal: Ability to maintain appropriate glucose levels will improve Outcome: Progressing   Problem: Nutritional: Goal: Maintenance of adequate nutrition will improve Outcome: Progressing Goal: Progress toward achieving an optimal weight will improve Outcome: Progressing   Problem: Tissue Perfusion: Goal: Adequacy of tissue perfusion will improve Outcome: Progressing   Problem: Education: Goal: Knowledge of General Education information will improve Description: Including pain rating scale, medication(s)/side effects and non-pharmacologic comfort measures Outcome: Progressing   Problem: Clinical Measurements: Goal: Ability to maintain clinical measurements within normal limits will improve Outcome: Progressing Goal: Will remain free from infection Outcome: Progressing Goal: Diagnostic test results will improve Outcome: Progressing Goal: Respiratory complications will improve Outcome: Progressing Goal: Cardiovascular complication will be avoided Outcome: Progressing

## 2024-10-27 DIAGNOSIS — H9191 Unspecified hearing loss, right ear: Secondary | ICD-10-CM

## 2024-10-27 DIAGNOSIS — R202 Paresthesia of skin: Secondary | ICD-10-CM

## 2024-10-27 DIAGNOSIS — R519 Headache, unspecified: Secondary | ICD-10-CM

## 2024-10-27 DIAGNOSIS — N179 Acute kidney failure, unspecified: Secondary | ICD-10-CM

## 2024-10-27 DIAGNOSIS — E1165 Type 2 diabetes mellitus with hyperglycemia: Secondary | ICD-10-CM | POA: Diagnosis not present

## 2024-10-27 DIAGNOSIS — H9193 Unspecified hearing loss, bilateral: Secondary | ICD-10-CM | POA: Diagnosis not present

## 2024-10-27 DIAGNOSIS — R509 Fever, unspecified: Secondary | ICD-10-CM | POA: Diagnosis not present

## 2024-10-27 DIAGNOSIS — H900 Conductive hearing loss, bilateral: Secondary | ICD-10-CM | POA: Diagnosis not present

## 2024-10-27 LAB — CBC WITH DIFFERENTIAL/PLATELET
Abs Immature Granulocytes: 0.08 K/uL — ABNORMAL HIGH (ref 0.00–0.07)
Basophils Absolute: 0 K/uL (ref 0.0–0.1)
Basophils Relative: 0 %
Eosinophils Absolute: 0 K/uL (ref 0.0–0.5)
Eosinophils Relative: 0 %
HCT: 29.2 % — ABNORMAL LOW (ref 36.0–46.0)
Hemoglobin: 9.1 g/dL — ABNORMAL LOW (ref 12.0–15.0)
Immature Granulocytes: 1 %
Lymphocytes Relative: 10 %
Lymphs Abs: 1.1 K/uL (ref 0.7–4.0)
MCH: 25.8 pg — ABNORMAL LOW (ref 26.0–34.0)
MCHC: 31.2 g/dL (ref 30.0–36.0)
MCV: 82.7 fL (ref 80.0–100.0)
Monocytes Absolute: 0.7 K/uL (ref 0.1–1.0)
Monocytes Relative: 7 %
Neutro Abs: 9.2 K/uL — ABNORMAL HIGH (ref 1.7–7.7)
Neutrophils Relative %: 82 %
Platelets: 298 K/uL (ref 150–400)
RBC: 3.53 MIL/uL — ABNORMAL LOW (ref 3.87–5.11)
RDW: 14.6 % (ref 11.5–15.5)
WBC: 11.2 K/uL — ABNORMAL HIGH (ref 4.0–10.5)
nRBC: 0 % (ref 0.0–0.2)

## 2024-10-27 LAB — GLUCOSE, CAPILLARY
Glucose-Capillary: 241 mg/dL — ABNORMAL HIGH (ref 70–99)
Glucose-Capillary: 281 mg/dL — ABNORMAL HIGH (ref 70–99)
Glucose-Capillary: 301 mg/dL — ABNORMAL HIGH (ref 70–99)
Glucose-Capillary: 313 mg/dL — ABNORMAL HIGH (ref 70–99)
Glucose-Capillary: 314 mg/dL — ABNORMAL HIGH (ref 70–99)
Glucose-Capillary: 325 mg/dL — ABNORMAL HIGH (ref 70–99)
Glucose-Capillary: 406 mg/dL — ABNORMAL HIGH (ref 70–99)

## 2024-10-27 LAB — ANGIOTENSIN CONVERTING ENZYME, CSF: Angio Convert Enzyme: <1.5 U/L (ref 0.0–2.5)

## 2024-10-27 LAB — BASIC METABOLIC PANEL WITH GFR
Anion gap: 13 (ref 5–15)
BUN: 31 mg/dL — ABNORMAL HIGH (ref 6–20)
CO2: 23 mmol/L (ref 22–32)
Calcium: 8.3 mg/dL — ABNORMAL LOW (ref 8.9–10.3)
Chloride: 95 mmol/L — ABNORMAL LOW (ref 98–111)
Creatinine, Ser: 1.3 mg/dL — ABNORMAL HIGH (ref 0.44–1.00)
GFR, Estimated: 52 mL/min — ABNORMAL LOW (ref >60)
Glucose, Bld: 334 mg/dL — ABNORMAL HIGH (ref 70–99)
Potassium: 5 mmol/L (ref 3.5–5.1)
Sodium: 131 mmol/L — ABNORMAL LOW (ref 135–145)

## 2024-10-27 LAB — VDRL, CSF: VDRL Quant, CSF: NONREACTIVE

## 2024-10-27 MED ORDER — INSULIN REGULAR HUMAN (CONC) 500 UNIT/ML ~~LOC~~ SOPN
35.0000 [IU] | PEN_INJECTOR | Freq: Three times a day (TID) | SUBCUTANEOUS | Status: DC
  Administered 2024-10-27 – 2024-10-28 (×3): 35 [IU] via SUBCUTANEOUS
  Filled 2024-10-27: qty 3

## 2024-10-27 NOTE — Telephone Encounter (Signed)
 Requested Prescriptions  Refused Prescriptions Disp Refills   gabapentin  (NEURONTIN ) 300 MG capsule [Pharmacy Med Name: GABAPENTIN  300MG  CAPSULES] 180 capsule 2    Sig: TAKE 2 CAPSULES(600 MG) BY MOUTH THREE TIMES DAILY     Neurology: Anticonvulsants - gabapentin  Failed - 10/27/2024  1:32 PM      Failed - Cr in normal range and within 360 days    Creat  Date Value Ref Range Status  09/16/2016 0.71 0.50 - 1.10 mg/dL Final   Creatinine, Ser  Date Value Ref Range Status  10/27/2024 1.30 (H) 0.44 - 1.00 mg/dL Final   Creatinine,U  Date Value Ref Range Status  06/11/2011 44.6 mg/dL Final    Comment:    (NOTE) Cutoff Values for Urine Drug Screen:        Drug Class           Cutoff (ng/mL)        Amphetamines            1000        Barbiturates             200        Cocaine Metabolites      300        Benzodiazepines          200        Methadone                300        Opiates                 2000        Phencyclidine             25        Propoxyphene             300        Marijuana Metabolites     50 For medical purposes only.   Creatinine, Urine  Date Value Ref Range Status  07/14/2021 193.74 mg/dL Final    Comment:    Performed at [facility], [address], [place], [place] [address]         Passed - Completed PHQ-2 or PHQ-9 in the last 360 days      Passed - Valid encounter within last 12 months    Recent Outpatient Visits           2 months ago Uncontrolled type 2 diabetes mellitus with hyperglycemia, with long-term current use of insulin  (HCC)   [place] [facility] Celestia Rosaline SQUIBB, NP   5 months ago Anxiety and depression   [place] [facility] Celestia Rosaline SQUIBB, NP   7 months ago Uncomplicated asthma, unspecified asthma severity, unspecified whether persistent   [place] [facility] Celestia Rosaline SQUIBB, NP   1 year ago Muscle spasm   [place] [facility] Celestia Rosaline SQUIBB, NP   1 year ago Diabetes mellitus with complication [facility])    [facility] Celestia Rosaline SQUIBB, NP

## 2024-10-27 NOTE — Progress Notes (Signed)
 NEUROLOGY CONSULT FOLLOW UP NOTE   Date of service: October 27, 2024 Patient Name: Lindsay [name] MRN:  [id number] DOB:  1981-11-12  Interval Hx/subjective   Patient is seen in her room with no family members at the bedside.  She has been hemodynamically stable and afebrile overnight.  She reports that some hearing has come back in her left ear, although it sounds as though she is in a cave and she still cannot hear anything with her right ear.  Vitals   Vitals:   10/26/24 2023 10/26/24 2340 10/27/24 0511 10/27/24 0744  BP: (!) 154/64  116/64 133/72  Pulse: 91 82 62 67  Resp: 18 20 18 18   Temp: 98.3 F (36.8 C)   98 F (36.7 C)  TempSrc: Oral   Oral  SpO2: 97% 95% 97% 99%     There is no height or weight on file to calculate BMI.  Physical Exam   Constitutional: Appears well-developed and well-nourished.  Psych: Affect appropriate to situation.  Eyes: No scleral injection.  HENT: No OP obstrucion.  Head: Normocephalic.  Cardiovascular: Normal rate and regular rhythm.  Respiratory: Effort normal, non-labored breathing.  Skin: WDI.   Neurologic Examination   Mental Status: AA&Ox3, able to give clear and coherent history of present illness Speech/Language: speech is without dysarthria or aphasia.   Cranial Nerves:  II: PERRL.  III, IV, VI: EOMI. Eyelids elevate symmetrically.  V: Sensation is intact to light touch and symmetrical to face.  VII: Smile is symmetrical.  VIII: Hearing intact to loud voice on the left, absent on the right. IX, X:  Phonation is normal.  KP:Dynloizm shrug 5/5. XII: tongue is midline without fasciculations. Motor: 5/5 strength to all muscle groups tested.  Tone: is normal and bulk is normal Sensation- Intact to light touch bilaterally and symmetrical in bilateral arms.  Some paresthesias and decreased sensation noted in left lower extremity Coordination: FTN intact bilaterally Gait- deferred   Medications  Current  Facility-Administered Medications:    0.9 %  sodium chloride  infusion, , Intravenous, Continuous, Gaines Carrier, [facility], Last Rate: 125 mL/hr at 10/26/24 0430, Restarted at 10/26/24 0430   acetaminophen  (TYLENOL ) tablet 650 mg, 650 mg, Oral, Q6H PRN, 650 mg at 10/26/24 0435 **OR** acetaminophen  (TYLENOL ) suppository 650 mg, 650 mg, Rectal, Q6H PRN, Krugh, Marissa C, DO   acyclovir (ZOVIRAX) 1,000 mg in dextrose  5 % 250 mL IVPB, 1,000 mg, Intravenous, Q8H, Denninger, Jade M, RPH, Last Rate: 270 mL/hr at 10/27/24 0551, 1,000 mg at 10/27/24 0551   atorvastatin  (LIPITOR ) tablet 20 mg, 20 mg, Oral, Daily, Dahal, Binaya, MD, 20 mg at 10/26/24 1144   cefTRIAXone  (ROCEPHIN ) 2 g in sodium chloride  0.9 % 100 mL IVPB, 2 g, Intravenous, Q12H, Krugh, Marissa C, DO, Stopped at 10/27/24 9357   DULoxetine  (CYMBALTA ) DR capsule 30 mg, 30 mg, Oral, BID, Dahal, Binaya, MD, 30 mg at 10/26/24 2159   famotidine  (PEPCID ) tablet 20 mg, 20 mg, Oral, BID, Dahal, Binaya, MD, 20 mg at 10/26/24 2159   gabapentin  (NEURONTIN ) capsule 600 mg, 600 mg, Oral, BID, Dahal, Binaya, MD, 600 mg at 10/26/24 2159   heparin  injection 5,000 Units, 5,000 Units, Subcutaneous, Q8H, Krugh, Marissa C, DO, 5,000 Units at 10/27/24 0549   insulin  aspart (novoLOG ) injection 0-20 Units, 0-20 Units, Subcutaneous, Q4H, Mansy, Jan A, MD, 15 Units at 10/27/24 0548   insulin  regular human CONCENTRATED (HUMULIN  R) 500 UNIT/ML KwikPen 25 Units, 25 Units, Subcutaneous, TID WC, Dahal, Binaya, MD, 25 Units at  10/26/24 1703   ondansetron  (ZOFRAN ) tablet 4 mg, 4 mg, Oral, Q6H PRN **OR** ondansetron  (ZOFRAN ) injection 4 mg, 4 mg, Intravenous, Q6H PRN, Krugh, Marissa C, DO   oxyCODONE  (Oxy IR/ROXICODONE ) immediate release tablet 10 mg, 10 mg, Oral, Q6H PRN, Cottie Donnice PARAS, MD, 10 mg at 10/27/24 9357   vancomycin  (VANCOREADY) IVPB 1500 mg/300 mL, 1,500 mg, Intravenous, Q24H, Denninger, Vermell HERO, RPH, Stopped at 10/27/24 0642  Labs and Diagnostic Imaging   CBC:   Recent Labs  Lab 10/25/24 1629 10/25/24 2356 10/26/24 0551 10/27/24 0410  WBC 9.8   < > 7.0 11.2*  NEUTROABS 6.4  --   --  9.2*  HGB 10.6*   < > 10.3* 9.1*  HCT 35.9*   < > 33.5* 29.2*  MCV 85.5   < > 84.2 82.7  PLT 282   < > 248 298   < > = values in this interval not displayed.    Basic Metabolic Panel:  Lab Results  Component Value Date   NA 131 (L) 10/27/2024   K 5.0 10/27/2024   CO2 23 10/27/2024   GLUCOSE 334 (H) 10/27/2024   BUN 31 (H) 10/27/2024   CREATININE 1.30 (H) 10/27/2024   CALCIUM  8.3 (L) 10/27/2024   GFRNONAA 52 (L) 10/27/2024   GFRAA 98 12/05/2020   Lipid Panel:  Lab Results  Component Value Date   LDLCALC 100 (H) 03/15/2024   HgbA1c:  Lab Results  Component Value Date   HGBA1C 10.1 (H) 10/25/2024   Urine Drug Screen:     Component Value Date/Time   LABOPIA NEGATIVE 06/11/2011 0914   COCAINSCRNUR NEGATIVE 06/11/2011 0914   LABBENZ NEGATIVE 06/11/2011 0914   AMPHETMU NEGATIVE 06/11/2011 0914    Alcohol  Level No results found for: [facility] INR  Lab Results  Component Value Date   INR 1.1 07/10/2021   APTT  Lab Results  Component Value Date   APTT 31 07/10/2021   CSF glucose 171 CSF protein 29 CSF RBC 147 in tube 1, 15 in tube 4 CSF WBC 2 in tube 1, 2 in tube 4 Meningitis/encephalitis panel negative Cryptococcal antigen pending HIV negative AFB smear pending AFB culture pending Fungus culture pending CSF VDRL pending CSF angiotensin-converting enzyme pending Oligoclonal bands pending  CT Head without contrast (Personally reviewed): No acute abnormality  MRI Brain (Personally reviewed): No acute abnormality  Assessment  Carinna D Pinnix is a 43 y.o. female with history of anemia, renal failure, asthma, depression, diabetes, hypertension, hyperlipidemia, CAD, sleep apnea and obesity who presents with acute onset headache, bilateral acute hearing loss and light sensitivity.  Patient reports having a holocephalic headache for 6  days, then on 11/4, she awoke screaming and speaking in tongues and then experienced hearing loss.  Prior to the hearing loss, she did have ringing in her ears sounding like loud church bells, followed by a screeching sound, and then she became unable to hear anything.  On 11/5, she noticed that she had left-sided numbness with paresthesias to the left side of her face; she then presented to the ED for evaluation.   - Patient reports today that some hearing has come back in her left ear, although it sounds as though she is in a cave but she still cannot hear anything with her right ear.  She reports that the left-sided tingling and numbness has resolved in her face and her arm, although she still has significant paresthesias in her left thigh. - Exam today is with subjective return of  hearing in left ear. No focal weakness. Subjective sensory numbness to LLE - Lumbar puncture reveals elevated opening pressure of 30 with closing pressure of 23, but procedure was performed in prone position, and this pressure is likely artifactual given her morbid obesity, which when lying prone typically results in a physiological transient increase in ICP as measured by LP.     MRI brain with and without contrast was negative for any acute abnormalities.  She is receiving empiric antibiotics and antimicrobials for possible CNS infection, although meningitis encephalitis panel was negative.  Patient was on dexamethasone  for possible meningitis, but this was discontinued due to hyperglycemia.  Recommendations  - ID consult, continue antimicrobial therapy until ID weighs in on this.   - Recommend audiology consult - Neurology will continue to follow ______________________________________________________________________ Patient seen by NP. Signed, Cortney E Everitt Clint Kill, NP [facility]   Electronically signed: Dr. Jerel Sardina

## 2024-10-27 NOTE — Inpatient Diabetes Management (Addendum)
 Inpatient Diabetes Program Recommendations  AACE/ADA: New Consensus Statement on Inpatient Glycemic Control (2015)  Target Ranges:  Prepandial:   less than 140 mg/dL      Peak postprandial:   less than 180 mg/dL (1-2 hours)      Critically ill patients:  140 - 180 mg/dL   Lab Results  Component Value Date   GLUCAP 301 (H) 10/27/2024   HGBA1C 10.1 (H) 10/25/2024    Latest Reference Range & Units 10/26/24 23:43 10/27/24 01:24 10/27/24 05:08 10/27/24 08:35  Glucose-Capillary 70 - 99 mg/dL 576 (H) 593 (H) 685 (H) 301 (H)  (H): Data is abnormally high Review of Glycemic Control  Diabetes history: DM2 Outpatient Diabetes medications: Humulin  Regular U-500 insulin  85 units at breakfast, 85 units at lunch, 75-80 units with supper, Lantus  25 units daily, Mounjaro 5 mg weekly Current orders for Inpatient glycemic control: U-500 insulin  25 units TID with meals, Novolog  0-20 units correction scale every 4 hours.   Inpatient Diabetes Program Recommendations:   Noted that blood sugars have been greater than 250 mg/dl. Noted that patient has been on Decadron .   Recommend increasing U-500 insulin  to 35 units TID with meals if blood sugars continue to be greater than 180 mg/dl. Patient does take 85 units TID of U-500 at home. Titrate dosages as needed.  Marjorie Lunger RN BSN CDE Diabetes Coordinator Pager: [phone]  8am-5pm

## 2024-10-27 NOTE — Progress Notes (Signed)
 PROGRESS NOTE  Lindsay [name]  DOB: 1981-09-12  PCP: Celestia Rosaline SQUIBB, NP FMW:[id number]  DOA: 10/25/2024  LOS: 1 day  Hospital Day: 3  Subjective: This morning patient reports improvement with her heearing, she was able to comprehends and verbalizes clearly.  Remains afebrile since initial temperature of 100.4, hemodynamically stable She had just done   Brief narrative: Lindsay [name] is a 43 y.o. female with PMH significant for DM2, HTN, HLD, morbid obesity, OSA, CAD s/p stent, CHF, anxiety/depression, chronic anemia, arthritis. 11/5, patient presented to ED for evaluation of sudden onset bilateral hearing loss.  On 10/31, patient began feeling sick with chest pain, fluid retention in the legs, headache. She was seen in the ED on 11/2, charged home on increased dose of torsemide .  She continued to have headache and intermittent fever. On 11/5, she woke up at 3 AM 'screaming and speaking in tongues' and then everything went silent.  While this was going on, she was having ringing in her ears that sound like loud church bells.  Everything then went completely quiet.  Around 10 AM, she noticed that the left side of her face, left arm and left leg had numbness.  In the ED, she had a fever of 100.4, heart rate 97, blood pressure 141/74, breathing on room air. Labs with WC count 9.8, hemoglobin 10.6, BUN/creatinine 22/2.08, glucose 243 Respiratory virus panel unremarkable. Blood culture sent CT head unremarkable MRI brain with and without contrast did not show any evidence of acute abnormality or abnormal enhancement. Seen by neurology in the ED  Recommended LP and empiric meningitis coverage. Status post successful fluoroscopic guided lumbar puncture. 11/06  Meningitis/Encephalitis panel negative. Dexamethasone   discontinued. Assessment and plan: Sudden onset bilateral hearing loss  Progressive headache  Timeline of symptoms and imagings as above  Underwent fluoroscopy  guided LP by IR today.   Per neurology, CSF sample to sendt for Protein and glucose, cell count on tubes 1 and 4, meningitis/encephalitis panel, IgG index, cryptococcal antigen, VDRL  CSF glucose level elevated, protein level not elevated, others pending Currently on empiric antibiotic coverage with IV Rocephin , IV vancomycin , acyclovir as well as Decadron . Initially had temperature 100.4.  No fever.  WBC count normal. Neurology Dr. 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 Recommended OP follow up Recent Labs  Lab 10/22/24 1523 10/25/24 1629 10/25/24 2356 10/26/24 0551 10/27/24 0410  WBC 8.4 9.8 9.2 7.0 11.2*  PROCALCITON  --   --  0.18  --   --    AKI Baseline creatinine less than 1 in June 2025. Presented with elevated creatinine of 2.15. Slightly better today at 1.72.  Continue to monitor Recent Labs    03/15/24 1649 04/13/24 1132 06/12/24 2248 06/12/24 2252 10/22/24 1523 10/25/24 1629 10/25/24 2356 10/26/24 0551 10/26/24 1211 10/27/24 0410  BUN 19 32* 11 11 24* 22*  --  27* 27* 31*  CREATININE 1.07* 1.73* 0.99 0.90 1.31* 2.08* 2.15* 1.72* 1.66* 1.30*  CO2 27 22 26   --  27 30  --  26 25 23    CHF HTN Most recent echo from 2023 with EF 65 to 70%, mild LVH, RV size and function normal. Echo reviewed Hemodynamically stable.  Looks euvolemic. PTA meds- torsemide  40 mg twice daily, hydralazine  100 mg 3 times daily, losartan  100 mg daily.  Not on beta-blocker. Currently all meds are on hold.  CAD s/p stent  HLD PTA meds- aspirin  81 mg daily (not taking), Lipitor  20 mg daily, nitro sublingual.   Type 2 diabetes mellitus uncontrolled with hyperglycemia Diabetic neuropathy A1c 10.1 on 10/25/2024 Blood sugar level running elevated.  Patient is on high dose of dexamethasone  10 mg every 6 hours.  Guidelines state to continue it  for 2 to 4 days.  Can DC if CSF studies rule out meningitis. PTA meds-Mounjaro, Lantus  25 units daily, high-dose Premeal U-500 insulin  Diabetes care coordinator consulted Has been started on Humulin  R U-500 25 units 3 times daily with meals, monitor CBG Q4 with 0 to 20 units SSI Continue gabapentin  600 mg twice daily.   Recent Labs  Lab 10/26/24 2343 10/27/24 0124 10/27/24 0508 10/27/24 0835 10/27/24 1136  GLUCAP 423* 406* 314* 301* 281*   Mild chronic anemia GERD Hemoglobin stable Continue Pepcid  Recent Labs    10/22/24 1523 10/25/24 1629 10/25/24 2356 10/26/24 0551 10/27/24 0410  HGB 10.2* 10.6* 10.2* 10.3* 9.1*  MCV 85.1 85.5 83.0 84.2 82.7   Hyponatremia Mild.  Continue to monitor Recent Labs  Lab 10/22/24 1523 10/25/24 1629 10/26/24 0551 10/26/24 1211 10/27/24 0410  NA 137 135 133* 133* 131*   Hyperkalemia Potassium level elevated to 5.7 this morning.  Not on any potassium supplements or potassium sparing diuretic Repeat BMP at noon so some improvement.  Continue to monitor Recent Labs  Lab 10/22/24 1523 10/25/24 1629 10/26/24 0551 10/26/24 1211 10/27/24 0410  K 4.7 4.3 5.7* 5.2* 5.0   Morbid Obesity  There is no height or weight on file to calculate BMI. Patient has been advised to make an attempt to improve diet and exercise patterns to aid in weight loss.  OSA On nocturnal CPAP  Arthritis PTA meds- Percocet 10/325 PRN, Zanaflex  PRN  Anxiety/depression PTA meds- Cymbalta  30 mg twice daily    Mobility: Encourage ambulation  Goals of care   Code Status: Full Code     DVT prophylaxis:  heparin  injection 5,000 Units Start: 10/25/24 2230   Antimicrobials: IV Rocephin , vancomycin , acyclovir Fluid: None Consultants: Neurology Family Communication: None at bedside  Status: Inpatient Level of care:  Telemetry   Patient is from: Home Needs to continue in-hospital care: Has bilateral hearing loss Anticipated d/c to: Pending clinical  course   Diet:  Diet Order             Diet heart healthy/carb modified Fluid consistency: Thin  Diet effective now                   Scheduled Meds:  atorvastatin   20 mg Oral Daily   DULoxetine   30 mg Oral BID   famotidine   20 mg Oral BID   gabapentin   600 mg Oral BID   heparin   5,000 Units Subcutaneous Q8H   insulin  aspart  0-20 Units Subcutaneous Q4H   insulin  regular human CONCENTRATED  25 Units Subcutaneous TID WC    PRN meds: acetaminophen  **OR** acetaminophen , ondansetron  **OR** ondansetron  (ZOFRAN ) IV, oxyCODONE    Infusions:   sodium chloride  125 mL/hr at 10/26/24 0430   acyclovir (ZOVIRAX) 1,000 mg in dextrose  5 % 250 mL IVPB 1,000 mg (10/27/24 0551)   cefTRIAXone  (ROCEPHIN )  IV 2 g (10/27/24 0924)   vancomycin  Stopped (10/27/24 9357)    Antimicrobials: Anti-infectives (From admission, onward)    Start     Dose/Rate Route Frequency Ordered Stop   10/26/24 2000  cefTRIAXone  (ROCEPHIN ) 2 g in sodium chloride  0.9 % 100 mL IVPB  Status:  Discontinued  2 g 200 mL/hr over 30 Minutes Intravenous Every 24 hours 10/25/24 2228 10/25/24 2238   10/26/24 2000  vancomycin  (VANCOREADY) IVPB 1500 mg/300 mL        1,500 mg 150 mL/hr over 120 Minutes Intravenous Every 24 hours 10/25/24 2310     10/26/24 1000  cefTRIAXone  (ROCEPHIN ) 2 g in sodium chloride  0.9 % 100 mL IVPB        2 g 200 mL/hr over 30 Minutes Intravenous Every 12 hours 10/25/24 2238     10/26/24 0800  acyclovir (ZOVIRAX) 1,000 mg in dextrose  5 % 250 mL IVPB        1,000 mg 270 mL/hr over 60 Minutes Intravenous Every 8 hours 10/25/24 2310     10/25/24 1930  vancomycin  (VANCOCIN ) 2,500 mg in sodium chloride  0.9 % 500 mL IVPB        2,500 mg 262.5 mL/hr over 120 Minutes Intravenous  Once 10/25/24 1920 10/26/24 0124   10/25/24 1930  acyclovir (ZOVIRAX) 1,000 mg in dextrose  5 % 250 mL IVPB        1,000 mg 270 mL/hr over 60 Minutes Intravenous Once 10/25/24 1921 10/26/24 0330   10/25/24 1915   cefTRIAXone  (ROCEPHIN ) 2 g in sodium chloride  0.9 % 100 mL IVPB        2 g 200 mL/hr over 30 Minutes Intravenous  Once 10/25/24 1900 10/25/24 2153   10/25/24 1915  vancomycin  (VANCOCIN ) IVPB 1000 mg/200 mL premix  Status:  Discontinued        1,000 mg 200 mL/hr over 60 Minutes Intravenous  Once 10/25/24 1901 10/25/24 1919       Objective: Vitals:   10/27/24 0511 10/27/24 0744  BP: 116/64 133/72  Pulse: 62 67  Resp: 18 18  Temp:  98 F (36.7 C)  SpO2: 97% 99%    Intake/Output Summary (Last 24 hours) at 10/27/2024 1154 Last data filed at 10/26/2024 2240 Gross per 24 hour  Intake 3235.95 ml  Output --  Net 3235.95 ml   There were no vitals filed for this visit. Weight change:  There is no height or weight on file to calculate BMI.   Physical Exam: General exam: Pleasant, morbidly obese young African-American female. Skin: No rashes, lesions or ulcers. HEENT: Atraumatic, normocephalic, no obvious bleeding Lungs: Clear to auscultation bilaterally,  CVS: S1, S2, no murmur,   GI/Abd: Soft, nontender, nondistended, bowel sound present,   CNS: Alert, awake, unable to hear his communicates by writing.  Able to comprehend and verbalize Psychiatry: Mood appropriate Extremities: No pedal edema, no calf tenderness,   Data Review: I have personally reviewed the laboratory data and studies available.     ECHOCARDIOGRAM REPORT       Patient Name:   SAMA ARAUZ Date of Exam: 10/26/2024  Medical Rec #:  [id number]         Height:       67.0 in  Accession #:    [id number]        Weight:       373.0 lb  Date of Birth:  [birth year]         BSA:          2.636 m  Patient Age:    43 years          BP:           133/73 mmHg  Patient Gender: F                 HR:  85 bpm.  Exam Location:  Inpatient   Procedure: 2D Echo, Cardiac Doppler, Color Doppler and Intracardiac             Opacification Agent (Both Spectral and Color Flow Doppler were             utilized during  procedure).   Indications:    Abnormal ECG 794.31 / R94.31    History:        Patient has prior history of Echocardiogram examinations,  most                 recent 08/15/2022. Risk Factors:Hypertension, Diabetes and  Sleep                 Apnea.    Sonographer:    Jayson Gaskins  Referring Phys: BINAYA DAHAL   IMPRESSIONS     1. Left ventricular ejection fraction, by estimation, is 65 to 70%. The  left ventricle has normal function. The left ventricle has no regional  wall motion abnormalities. Left ventricular diastolic function could not  be evaluated.   2. Right ventricular systolic function was not well visualized. The right  ventricular size is not well visualized.   3. The mitral valve is grossly normal. No evidence of mitral valve  regurgitation.   4. The aortic valve was not well visualized. Aortic valve regurgitation  is not visualized.   Conclusion(s)/Recommendation(s): Technically very limited study due to  very poor acoustic windows most structures not well seen. LV and RV appear  grossly normal.   FINDINGS   Left Ventricle: Left ventricular ejection fraction, by estimation, is 65  to 70%. The left ventricle has normal function. The left ventricle has no  regional wall motion abnormalities. The left ventricular internal cavity  size was normal in size. There is   no left ventricular hypertrophy. Left ventricular diastolic function  could not be evaluated.   Right Ventricle: The right ventricular size is not well visualized. Right  vetricular wall thickness was not well visualized. Right ventricular  systolic function was not well visualized.   Left Atrium: Left atrial size was not well visualized.   Right Atrium: Right atrial size was not well visualized.   Pericardium: There is no evidence of pericardial effusion.   Mitral Valve: The mitral valve is grossly normal. No evidence of mitral  valve regurgitation.   Tricuspid Valve: The tricuspid valve is  grossly normal. Tricuspid valve  regurgitation is trivial.   Aortic Valve: The aortic valve was not well visualized. Aortic valve  regurgitation is not visualized.   Pulmonic Valve: The pulmonic valve was not well visualized. Pulmonic valve  regurgitation is not visualized.   Aorta: The aortic root was not well visualized.   Venous: The inferior vena cava was not well visualized.   IAS/Shunts: The interatrial septum was not well visualized.     LEFT VENTRICLE  PLAX 2D  LVIDd:         4.30 cm  LVIDs:         2.80 cm  LV PW:         1.00 cm  LV IVS:        1.00 cm  LVOT diam:     1.80 cm  LVOT Area:     2.54 cm       SHUNTS  Systemic Diam: 1.80 cm   Toribio Fuel MD  Electronically signed by Toribio Fuel MD  Signature Date/Time: 10/26/2024/11:36:26 PM  F/u labs ordered Unresulted Labs (  From admission, onward)     Start     Ordered   10/26/24 1039  VDRL, CSF  RELEASE UPON ORDERING,   TIMED        10/26/24 1039   10/26/24 1039  Fungus Culture With Stain  RELEASE UPON ORDERING,   TIMED        10/26/24 1039   10/26/24 1039  Acid Fast Culture with reflexed sensitivities  RELEASE UPON ORDERING,   TIMED        10/26/24 1039   10/26/24 1039  Acid Fast Smear (AFB)  RELEASE UPON ORDERING,   TIMED        10/26/24 1039   10/25/24 2205  Rapid urine drug screen (hospital performed)  Add-on,   AD        10/25/24 2204   10/25/24 2119  Pregnancy, urine  Once,   R        10/25/24 2118   10/25/24 1902  Oligoclonal bands, CSF + serum  (Meningitis Panel)  Once,   URGENT        10/25/24 1901   10/25/24 1902  Draw extra clot tube  (Meningitis Panel)  Once,   URGENT        10/25/24 1901            Signed, Landon FORBES Baller, MD [facility] 10/27/2024

## 2024-10-27 NOTE — Telephone Encounter (Signed)
Refill refused. Refill requested too soon

## 2024-10-27 NOTE — Plan of Care (Signed)

## 2024-10-27 NOTE — Procedures (Addendum)
   [facility] [address] [place], [place]  [address] [phone]  BEDSIDE AUDIOGRAM   NAME: Lindsay [name]     DOB:   27-Apr-1981     MRN: [id number]                                                                                     DATE: 10/27/2024  STATUS: Inpatient  DIAGNOSIS: Decreased Hearing Bilaterally    HISTORY: Sianne was seen today for a bedside audiogram and consult with audiology due to patient complaints of not being able to hear. She says she taught herself to lipread as a child so has been getting by. She said her hearing improved after a loud pop in the left ear Monday. She is still struggling to understand people unless she can see their face. She was able to intermittently understand provider today.   Otoscopy  Clear canals with healthy tympanic membranes visible bilaterally.   RESULTS:  Air Conduction Thresholds:  500 Hz 1000 Hz 2000 Hz 4000 Hz  Left ear: 60dB nHL 70dB nHL      45dB nHL 45dB nHL  Right ear: 60dB nHL 70dB nHL 45dB nHL 45dB nHL    ABR Bone Conduction Thresholds:  500 Hz 1000 Hz 2000 Hz 4000 Hz  unmasked 25dB nHL 30dB nHL      30dB nHL      15dB nHL    Distortion Product Otoacoustic Emissions (DPOAE):  500-10,000 Hz Left ear:  Present 1.5-2kHz, 5-9kHz  and absent 1khz, 3-5kHz and 10kHz Right ear: Present 3-10kHz, absent 500-2kHz   High Frequency (1000 Hz) Tympanometry:  Left ear:  A 1.18 volume, 25 dapa, . Right ear: A 1.05 volume, 22 dapa, .   IMPRESSION:  Today's results are consistent with bilateral hearing loss, likely conductive but complete evaluation needs to be done outpatient. Amplified headset necessary for accurate understanding if true thresholds.   FAMILY EDUCATION:  The test results and recommendations were explained to Azaleah. PSAP Reizen LoudEAR provided for patient use while admitted. RETURN TO INTERPRETING AT DISCHARGE  RECOMMENDATIONS:  Use amplified  headset when Ellarie reports difficulty hearing.  Refer to [facility] at discharge for follow up. Recommend Otolaryngology referral as well if patient still reporting otalgia.   43 minutes spent testing and counseling on results.   If you have any questions please feel free to contact me at [phone].  Lauraine Netta Luria AuD Audiologist

## 2024-10-28 DIAGNOSIS — H900 Conductive hearing loss, bilateral: Secondary | ICD-10-CM

## 2024-10-28 DIAGNOSIS — H9191 Unspecified hearing loss, right ear: Secondary | ICD-10-CM | POA: Diagnosis not present

## 2024-10-28 DIAGNOSIS — N179 Acute kidney failure, unspecified: Secondary | ICD-10-CM | POA: Diagnosis not present

## 2024-10-28 DIAGNOSIS — R519 Headache, unspecified: Secondary | ICD-10-CM | POA: Diagnosis not present

## 2024-10-28 DIAGNOSIS — R509 Fever, unspecified: Secondary | ICD-10-CM

## 2024-10-28 DIAGNOSIS — E1165 Type 2 diabetes mellitus with hyperglycemia: Secondary | ICD-10-CM | POA: Diagnosis not present

## 2024-10-28 DIAGNOSIS — R202 Paresthesia of skin: Secondary | ICD-10-CM | POA: Diagnosis not present

## 2024-10-28 LAB — GLUCOSE, CAPILLARY
Glucose-Capillary: 186 mg/dL — ABNORMAL HIGH (ref 70–99)
Glucose-Capillary: 125 mg/dL — ABNORMAL HIGH (ref 70–99)
Glucose-Capillary: 192 mg/dL — ABNORMAL HIGH (ref 70–99)
Glucose-Capillary: 189 mg/dL — ABNORMAL HIGH (ref 70–99)

## 2024-10-28 LAB — CULTURE, BLOOD (SINGLE)

## 2024-10-28 LAB — COMPREHENSIVE METABOLIC PANEL WITH GFR
ALT: 16 U/L (ref 0–44)
AST: 28 U/L (ref 15–41)
Albumin: 2.9 g/dL — ABNORMAL LOW (ref 3.5–5.0)
Alkaline Phosphatase: 69 U/L (ref 38–126)
Anion gap: 15 (ref 5–15)
BUN: 25 mg/dL — ABNORMAL HIGH (ref 6–20)
CO2: 24 mmol/L (ref 22–32)
Calcium: 8.9 mg/dL (ref 8.9–10.3)
Chloride: 98 mmol/L (ref 98–111)
Creatinine, Ser: 1.1 mg/dL — ABNORMAL HIGH (ref 0.44–1.00)
GFR, Estimated: >60 mL/min (ref >60)
Glucose, Bld: 128 mg/dL — ABNORMAL HIGH (ref 70–99)
Potassium: 4.6 mmol/L (ref 3.5–5.1)
Sodium: 137 mmol/L (ref 135–145)
Total Bilirubin: 0.7 mg/dL (ref 0.0–1.2)
Total Protein: 7 g/dL (ref 6.5–8.1)

## 2024-10-28 LAB — CBC
HCT: 30.4 % — ABNORMAL LOW (ref 36.0–46.0)
Hemoglobin: 9.3 g/dL — ABNORMAL LOW (ref 12.0–15.0)
MCH: 25.2 pg — ABNORMAL LOW (ref 26.0–34.0)
MCHC: 30.6 g/dL (ref 30.0–36.0)
MCV: 82.4 fL (ref 80.0–100.0)
Platelets: 252 K/uL (ref 150–400)
RBC: 3.69 MIL/uL — ABNORMAL LOW (ref 3.87–5.11)
RDW: 14.7 % (ref 11.5–15.5)
WBC: 9.2 K/uL (ref 4.0–10.5)
nRBC: 0 % (ref 0.0–0.2)

## 2024-10-28 MED ORDER — GABAPENTIN 300 MG PO CAPS
300.0000 mg | ORAL_CAPSULE | Freq: Three times a day (TID) | ORAL | Status: DC
  Administered 2024-10-28 – 2024-10-30 (×7): 300 mg via ORAL
  Filled 2024-10-28 (×7): qty 1

## 2024-10-28 MED ORDER — CYCLOBENZAPRINE HCL 10 MG PO TABS: 10.0000 mg | ORAL_TABLET | Freq: Three times a day (TID) | ORAL | Status: DC | PRN

## 2024-10-28 MED ORDER — HYDRALAZINE HCL 50 MG PO TABS
100.0000 mg | ORAL_TABLET | Freq: Three times a day (TID) | ORAL | Status: DC
  Administered 2024-10-28 – 2024-10-30 (×5): 100 mg via ORAL
  Filled 2024-10-28 (×5): qty 2

## 2024-10-28 MED ORDER — TORSEMIDE 20 MG PO TABS
40.0000 mg | ORAL_TABLET | Freq: Two times a day (BID) | ORAL | Status: DC
  Administered 2024-10-28 – 2024-10-30 (×4): 40 mg via ORAL
  Filled 2024-10-28 (×4): qty 2

## 2024-10-28 NOTE — Consult Note (Addendum)
 Reason for Consult:hearing loss Referring Physician: Dr Carlen Chiquita Lindsay [name] is an 43 y.o. female.  HPI: hx of hearing loss since earlier this week. She said she had loud ringing in both ear at 3 am and bilateral hearing loss. No vertigo. No facial weakness. No previous issue. She is now functioning well with lip reading. CT scan and MRI show no fluid or ear pathology.no ear pain currently. No drainage.    Past Medical History:  Diagnosis Date   Anemia    Anxiety    ARF (acute renal failure) 04/2015   Arthritis    Asthma    Cellulitis of right upper extremity    Complication of anesthesia    Heart rate drop during epidural,Respiration,and BP   Coronary artery disease    Depression    Diabetes mellitus    insulin  dependent   Hyperlipidemia LDL goal <70    Hypertension    Obesity    S/P angioplasty with stent 08/2016   DES to mLAD and PTCA only to 2nd diag ostium.    Sleep apnea     Past Surgical History:  Procedure Laterality Date   CARDIAC CATHETERIZATION N/A 09/07/2016   Procedure: Left Heart Cath and Coronary Angiography;  Surgeon: Victory LELON Sharps, MD;  Location: [facility] INVASIVE CV LAB;  Service: Cardiovascular;  Laterality: N/A;   CARDIAC CATHETERIZATION N/A 09/07/2016   Procedure: Coronary Stent Intervention;  Surgeon: Victory LELON Sharps, MD;  Location: [facility] INVASIVE CV LAB;  Service: Cardiovascular;  Laterality: N/A;   CARDIAC CATHETERIZATION N/A 09/07/2016   Procedure: Coronary Balloon Angioplasty;  Surgeon: Victory LELON Sharps, MD;  Location: [facility] INVASIVE CV LAB;  Service: Cardiovascular;  Laterality: N/A;   CARPAL TUNNEL RELEASE Right 04/25/2024   Procedure: RIGHT ENDOSCOPIC CARPAL TUNNEL RELEASE;  Surgeon: Beverley Evalene JONETTA, MD;  Location: [facility] ORS;  Service: Orthopedics;  Laterality: Right;   CATARACT EXTRACTION, BILATERAL     CESAREAN SECTION     x 1   IRRIGATION AND DEBRIDEMENT SHOULDER Right 04/30/2015   Procedure: IRRIGATION AND DEBRIDEMENT SHOULDER;  Surgeon: Evalene Lindsay Beverley,  MD;  Location: [facility] OR;  Service: Orthopedics;  Laterality: Right;   IRRIGATION AND DEBRIDEMENT SHOULDER Right 05/01/2015   LEFT HEART CATH AND CORONARY ANGIOGRAPHY N/A 04/25/2020   Procedure: LEFT HEART CATH AND CORONARY ANGIOGRAPHY;  Surgeon: Sharps Victory LELON, MD;  Location: [facility] INVASIVE CV LAB;  Service: Cardiovascular;  Laterality: N/A;   LEG SURGERY Right    remobal ob abcess   SHOULDER ARTHROSCOPY Right 04/30/2015   Procedure: ARTHROSCOPY SHOULDER;  Surgeon: Evalene Lindsay Beverley, MD;  Location: [facility] OR;  Service: Orthopedics;  Laterality: Right;   TONSILLECTOMY      Family History  Problem Relation Age of Onset   Diabetes Mother    Hypertension Mother    Heart disease Mother    Diabetes Father    Heart disease Father    Stroke Maternal Grandmother    Cancer Maternal Grandmother    Breast cancer Cousin        30's   Breast cancer Cousin        39's    Social History:  reports that she quit smoking about 17 years ago. Her smoking use included cigarettes. She started smoking about 19 years ago. She has a 0.5 pack-year smoking history. She has never used smokeless tobacco. She reports current alcohol  use. She reports that she does not use drugs.  Allergies:  Allergies  Allergen Reactions   Hydrazine Yellow [Fd&C Yellow #  5 (Tartrazine)] Shortness Of Breath and Swelling    Swelling mostly noticed in legs and feet, retaining urination, shortness of breaht, and minor chest pain   Lisinopril  Shortness Of Breath    Was on prinzide ; had sob/chest pain on it.   Pineapple Itching   Pineapple Extract     Medications: I have reviewed the patient's current medications.  Results for orders placed or performed during the hospital encounter of 10/25/24 (from the past 48 hours)  CSF cell count with differential collection tube #: 1     Status: Abnormal   Collection Time: 10/26/24 10:07 AM  Result Value Ref Range   Tube # 1    Color, CSF COLORLESS COLORLESS   Appearance, CSF CLEAR CLEAR    Supernatant NOT INDICATED    RBC Count, CSF 147 (H) 0 /cu mm   WBC, CSF 2 0 - 5 /cu mm   Other Cells, CSF TOO FEW TO COUNT, SMEAR AVAILABLE FOR REVIEW     Comment: FEW LYMPHOCYTES, RARE NEUTROPHILS AND MONOCYTES Performed at [facility], [address] [place], [place] [address]   CSF cell count with differential collection tube #: 4     Status: Abnormal   Collection Time: 10/26/24 10:07 AM  Result Value Ref Range   Tube # 4    Color, CSF COLORLESS COLORLESS   Appearance, CSF CLEAR CLEAR   Supernatant NOT INDICATED    RBC Count, CSF 15 (H) 0 /cu mm   WBC, CSF 2 0 - 5 /cu mm   Other Cells, CSF TOO FEW TO COUNT, SMEAR AVAILABLE FOR REVIEW     Comment: FEW LYMPHOCYTES AND RARE MONOCYTES Performed at [facility], [address] [place], [place] [address]   Protein and glucose, CSF     Status: Abnormal   Collection Time: 10/26/24 10:07 AM  Result Value Ref Range   Glucose, CSF 171 (H) 40 - 70 mg/dL   Total  Protein, CSF 29 15 - 45 mg/dL    Comment: Performed at [facility], [address], [place], [place] [address]  Meningitis/Encephalitis Panel (CSF)     Status: None   Collection Time: 10/26/24 10:07 AM  Result Value Ref Range   Cryptococcus neoformans/gattii (CSF) NOT DETECTED NOT DETECTED    Comment: (NOTE) Patients with a suspicion of cryptococcal meningitis should be tested  for cryptococcal antigen (CrAg).      Cytomegalovirus (CSF) NOT DETECTED NOT DETECTED   Enterovirus (CSF) NOT DETECTED NOT DETECTED   Escherichia coli K1 (CSF) NOT DETECTED NOT DETECTED    Comment: (NOTE) Only E. coli strains possessing the K1 capsular antigen will be detected.      Haemophilus influenzae (CSF) NOT DETECTED NOT DETECTED   Herpes simplex virus 1 (CSF) NOT DETECTED NOT DETECTED   Herpes simplex virus 2 (CSF) NOT DETECTED NOT DETECTED   Human herpesvirus 6 (CSF) NOT DETECTED NOT DETECTED   Human parechovirus (CSF) NOT DETECTED NOT DETECTED   Listeria  monocytogenes (CSF) NOT DETECTED NOT DETECTED   Neisseria meningitis (CSF) NOT DETECTED NOT DETECTED    Comment: (NOTE) Only encapsulated strains of N. meningitidis will be detected.     Streptococcus agalactiae (CSF) NOT DETECTED NOT DETECTED   Streptococcus pneumoniae (CSF) NOT DETECTED NOT DETECTED   Varicella zoster virus (CSF) NOT DETECTED NOT DETECTED    Comment: Performed at [facility], [address], [place], [place] [address]  Angiotensin converting enzyme, CSF  Status: None   Collection Time: 10/26/24 10:07 AM  Result Value Ref Range   Angio Convert Enzyme <1.5 0.0 - 2.5 U/L    Comment: (NOTE) Results of this test are labeled for research purposes only by the assay's manufacturer. The performance characteristics of this assay have not been established by the manufacturer. The result should not be used for treatment or for diagnostic purposes without confirmation of the diagnosis by another medically established diagnostic product or procedure. The performance characteristics were determined by [facility]. Performed At: [facility] [address] [place], [place] [id number] Jennette Shorter MD Ey:[id number]   VDRL, CSF     Status: None   Collection Time: 10/26/24 10:07 AM  Result Value Ref Range   VDRL Quant, CSF Non Reactive Non Rea:<1:1    Comment: (NOTE) Performed At: [facility] [address] [place], [place] [id number] Jennette Shorter MD Ey:[id number]   Cryptococcal antigen, CSF     Status: None   Collection Time: 10/26/24 10:07 AM  Result Value Ref Range   Crypto Ag NEGATIVE NEGATIVE   Cryptococcal Ag Titer NOT INDICATED NOT INDICATED    Comment: Performed at [facility], [address], [place], [place] [address]  Glucose, capillary     Status: Abnormal   Collection Time: 10/26/24 11:29 AM  Result Value Ref Range   Glucose-Capillary 414 (H) 70 - 99 mg/dL    Comment: Glucose reference range applies only to samples taken after  fasting for at least 8 hours.  Basic metabolic panel with GFR     Status: Abnormal   Collection Time: 10/26/24 12:11 PM  Result Value Ref Range   Sodium 133 (L) 135 - 145 mmol/L   Potassium 5.2 (H) 3.5 - 5.1 mmol/L   Chloride 96 (L) 98 - 111 mmol/L   CO2 25 22 - 32 mmol/L   Glucose, Bld 479 (H) 70 - 99 mg/dL    Comment: Glucose reference range applies only to samples taken after fasting for at least 8 hours.   BUN 27 (H) 6 - 20 mg/dL   Creatinine, Ser 8.33 (H) 0.44 - 1.00 mg/dL   Calcium  8.5 (L) 8.9 - 10.3 mg/dL   GFR, Estimated 39 (L) >60 mL/min    Comment: (NOTE) Calculated using the CKD-EPI Creatinine Equation (2021)    Anion gap 12 5 - 15    Comment: Performed at [facility], [address] [place], [place] [address]  Glucose, capillary     Status: Abnormal   Collection Time: 10/26/24  3:54 PM  Result Value Ref Range   Glucose-Capillary 535 (HH) 70 - 99 mg/dL    Comment: Glucose reference range applies only to samples taken after fasting for at least 8 hours.  Glucose, capillary     Status: Abnormal   Collection Time: 10/26/24  9:57 PM  Result Value Ref Range   Glucose-Capillary 525 (HH) 70 - 99 mg/dL    Comment: Glucose reference range applies only to samples taken after fasting for at least 8 hours.   Comment 1 Notify RN   Glucose, capillary     Status: Abnormal   Collection Time: 10/26/24 11:43 PM  Result Value Ref Range   Glucose-Capillary 423 (H) 70 - 99 mg/dL    Comment: Glucose reference range applies only to samples taken after fasting for at least 8 hours.  Glucose, capillary     Status: Abnormal   Collection Time: 10/27/24  1:24 AM  Result Value Ref Range  Glucose-Capillary 406 (H) 70 - 99 mg/dL    Comment: Glucose reference range applies only to samples taken after fasting for at least 8 hours.  Basic metabolic panel with GFR     Status: Abnormal   Collection Time: 10/27/24  4:10 AM  Result Value Ref Range   Sodium 131 (L) 135 - 145 mmol/L    Potassium 5.0 3.5 - 5.1 mmol/L   Chloride 95 (L) 98 - 111 mmol/L   CO2 23 22 - 32 mmol/L   Glucose, Bld 334 (H) 70 - 99 mg/dL    Comment: Glucose reference range applies only to samples taken after fasting for at least 8 hours.   BUN 31 (H) 6 - 20 mg/dL   Creatinine, Ser 8.69 (H) 0.44 - 1.00 mg/dL   Calcium  8.3 (L) 8.9 - 10.3 mg/dL   GFR, Estimated 52 (L) >60 mL/min    Comment: (NOTE) Calculated using the CKD-EPI Creatinine Equation (2021)    Anion gap 13 5 - 15    Comment: Performed at [facility], [address] [place], [place] [address]  CBC with Differential/Platelet     Status: Abnormal   Collection Time: 10/27/24  4:10 AM  Result Value Ref Range   WBC 11.2 (H) 4.0 - 10.5 K/uL   RBC 3.53 (L) 3.87 - 5.11 MIL/uL   Hemoglobin 9.1 (L) 12.0 - 15.0 g/dL   HCT 70.7 (L) 63.9 - 53.9 %   MCV 82.7 80.0 - 100.0 fL   MCH 25.8 (L) 26.0 - 34.0 pg   MCHC 31.2 30.0 - 36.0 g/dL   RDW 85.3 88.4 - 84.4 %   Platelets 298 150 - 400 K/uL   nRBC 0.0 0.0 - 0.2 %   Neutrophils Relative % 82 %   Neutro Abs 9.2 (H) 1.7 - 7.7 K/uL   Lymphocytes Relative 10 %   Lymphs Abs 1.1 0.7 - 4.0 K/uL   Monocytes Relative 7 %   Monocytes Absolute 0.7 0.1 - 1.0 K/uL   Eosinophils Relative 0 %   Eosinophils Absolute 0.0 0.0 - 0.5 K/uL   Basophils Relative 0 %   Basophils Absolute 0.0 0.0 - 0.1 K/uL   Immature Granulocytes 1 %   Abs Immature Granulocytes 0.08 (H) 0.00 - 0.07 K/uL    Comment: Performed at [facility], [address] [place], [place] [address]  Glucose, capillary     Status: Abnormal   Collection Time: 10/27/24  5:08 AM  Result Value Ref Range   Glucose-Capillary 314 (H) 70 - 99 mg/dL    Comment: Glucose reference range applies only to samples taken after fasting for at least 8 hours.  Glucose, capillary     Status: Abnormal   Collection Time: 10/27/24  8:35 AM  Result Value Ref Range   Glucose-Capillary 301 (H) 70 - 99 mg/dL    Comment: Glucose reference range applies only  to samples taken after fasting for at least 8 hours.  Glucose, capillary     Status: Abnormal   Collection Time: 10/27/24 11:36 AM  Result Value Ref Range   Glucose-Capillary 281 (H) 70 - 99 mg/dL    Comment: Glucose reference range applies only to samples taken after fasting for at least 8 hours.  Glucose, capillary     Status: Abnormal   Collection Time: 10/27/24  3:51 PM  Result Value Ref Range   Glucose-Capillary 325 (H) 70 - 99 mg/dL    Comment: Glucose reference range applies only to samples  taken after fasting for at least 8 hours.  Glucose, capillary     Status: Abnormal   Collection Time: 10/27/24  8:09 PM  Result Value Ref Range   Glucose-Capillary 313 (H) 70 - 99 mg/dL    Comment: Glucose reference range applies only to samples taken after fasting for at least 8 hours.  Glucose, capillary     Status: Abnormal   Collection Time: 10/27/24 11:55 PM  Result Value Ref Range   Glucose-Capillary 241 (H) 70 - 99 mg/dL    Comment: Glucose reference range applies only to samples taken after fasting for at least 8 hours.  Glucose, capillary     Status: Abnormal   Collection Time: 10/28/24  4:22 AM  Result Value Ref Range   Glucose-Capillary 186 (H) 70 - 99 mg/dL    Comment: Glucose reference range applies only to samples taken after fasting for at least 8 hours.    ECHOCARDIOGRAM COMPLETE Result Date: 10/26/2024    ECHOCARDIOGRAM REPORT   Patient Name:   Lindsay [name] Date of Exam: 10/26/2024 Medical Rec #:  [id number]         Height:       67.0 in Accession #:    [id number]        Weight:       373.0 lb Date of Birth:  12-31-1980         BSA:          2.636 m Patient Age:    43 years          BP:           133/73 mmHg Patient Gender: F                 HR:           85 bpm. Exam Location:  Inpatient Procedure: 2D Echo, Cardiac Doppler, Color Doppler and Intracardiac            Opacification Agent (Both Spectral and Color Flow Doppler were            utilized during procedure).  Indications:    Abnormal ECG 794.31 / R94.31  History:        Patient has prior history of Echocardiogram examinations, most                 recent 08/15/2022. Risk Factors:Hypertension, Diabetes and Sleep                 Apnea.  Sonographer:    Jayson Gaskins Referring Phys: BINAYA DAHAL IMPRESSIONS  1. Left ventricular ejection fraction, by estimation, is 65 to 70%. The left ventricle has normal function. The left ventricle has no regional wall motion abnormalities. Left ventricular diastolic function could not be evaluated.  2. Right ventricular systolic function was not well visualized. The right ventricular size is not well visualized.  3. The mitral valve is grossly normal. No evidence of mitral valve regurgitation.  4. The aortic valve was not well visualized. Aortic valve regurgitation is not visualized. Conclusion(s)/Recommendation(s): Technically very limited study due to very poor acoustic windows most structures not well seen. LV and RV appear grossly normal. FINDINGS  Left Ventricle: Left ventricular ejection fraction, by estimation, is 65 to 70%. The left ventricle has normal function. The left ventricle has no regional wall motion abnormalities. The left ventricular internal cavity size was normal in size. There is  no left ventricular hypertrophy. Left ventricular diastolic function could not be evaluated. Right Ventricle: The  right ventricular size is not well visualized. Right vetricular wall thickness was not well visualized. Right ventricular systolic function was not well visualized. Left Atrium: Left atrial size was not well visualized. Right Atrium: Right atrial size was not well visualized. Pericardium: There is no evidence of pericardial effusion. Mitral Valve: The mitral valve is grossly normal. No evidence of mitral valve regurgitation. Tricuspid Valve: The tricuspid valve is grossly normal. Tricuspid valve regurgitation is trivial. Aortic Valve: The aortic valve was not well visualized.  Aortic valve regurgitation is not visualized. Pulmonic Valve: The pulmonic valve was not well visualized. Pulmonic valve regurgitation is not visualized. Aorta: The aortic root was not well visualized. Venous: The inferior vena cava was not well visualized. IAS/Shunts: The interatrial septum was not well visualized.  LEFT VENTRICLE PLAX 2D LVIDd:         4.30 cm LVIDs:         2.80 cm LV PW:         1.00 cm LV IVS:        1.00 cm LVOT diam:     1.80 cm LVOT Area:     2.54 cm   SHUNTS Systemic Diam: 1.80 cm Toribio Fuel MD Electronically signed by Toribio Fuel MD Signature Date/Time: 10/26/2024/11:36:26 PM    Final    DG FL GUIDED LUMBAR PUNCTURE Result Date: 10/26/2024 CLINICAL DATA:  Patient with headache and hearing loss with request for image guided lumbar puncture (BMI 58) while inpatient. MRI and CT head available for review prior to procedure without acute abnormality. EXAM: LUMBAR PUNCTURE UNDER FLUOROSCOPY PROCEDURE: An appropriate skin entry site was determined fluoroscopically. Operator donned sterile gloves and mask. Skin site was marked, then prepped with Betadine , draped in usual sterile fashion, and infiltrated locally with 1% lidocaine . A 22 gauge, 7 inch spinal needle advanced into the thecal sac at L5-S1 from a right interlaminar approach. Clear colorless CSF spontaneously returned, with opening pressure of 30 cm water  measure in prone position as fluoroscopy table could not accommodate safely rolling patient body habitus. 21 ml CSF were collected and divided among 4 sterile vials for the requested laboratory studies. Closing pressure 23.5 cm water . The needle was then removed. The patient tolerated the procedure well and there were no complications. FLUOROSCOPY: Radiation Exposure Index (as provided by the fluoroscopic device): 132.8 mGy Kerma IMPRESSION: Technically successful lumbar puncture under fluoroscopy. This exam was performed by Brittany Huneycutt, NP, and was supervised and  interpreted by Dr. Ree Molt. Electronically Signed   By: Ree Molt M.D.   On: 10/26/2024 13:12    ROS Blood pressure (!) 140/85, pulse (!) 59, temperature 97.7 F (36.5 C), temperature source Oral, resp. rate 17, last menstrual period 08/02/2024, SpO2 100%. Physical Exam Constitutional:      Appearance: Normal appearance.  HENT:     Head: Normocephalic and atraumatic.     Right /left Ear: light into the ear no pathology seen.  ear canal and external ear normal.       Nose: Nose normal. Turbinates with mild hypertrophy, No significant swelling or masses.     Oral cavity/oropharynx: Mucous membranes are moist. No lesions or masses    Larynx: normal voice. Mirror attempted without success    Eyes:     Extraocular Movements: Extraocular movements intact.     Conjunctiva/sclera: Conjunctivae normal.     Pupils: Pupils are equal, round, and reactive to light.  Cardiovascular:     Rate and Rhythm: Normal rate.  Pulmonary:  Effort: Pulmonary effort is normal.  Musculoskeletal:     Cervical back: Normal range of motion and neck supple. No rigidity.  Lymphadenopathy:     Cervical: No cervical adenopathy or masses.salivary glands without lesions. .  Neurological:     Mental Status: He is alert. CN 2-12 intact. No nystagmus      Assessment/Plan: Bilateral conductive hearing loss- it is very odd to have spontaneous bilateral hearing especially CHL with no pathology evident. She is functioning very well at the bedside with hearing and I turned my head and covered mouth multiple times and she still responded well and appropriately. I could not find an otoscope on 3 floors that I looked but the audiologist looked with no pathology seen in the ear of TM or canal. Patient needs a a confirming audiogram and should be seen outpatient as soon as discharged. I talked to Dr Carlota and she will arrange.  Lindsay [name] 10/28/2024, 9:36 AM

## 2024-10-28 NOTE — Progress Notes (Signed)
 PROGRESS NOTE  Lindsay [name]  DOB: Apr 16, 1981  PCP: Celestia Rosaline SQUIBB, NP FMW:[id number]  DOA: 10/25/2024  LOS: 2 days  Hospital Day: 4  Subjective: This morning patient reports improvement with her hearing, she was able to comprehends and verbalizes clearly.  She reports overall improvement  with  left sided numbness, head aches.   Brief narrative: Lindsay [name] is a 43 y.o. female with PMH significant for DM2, HTN, HLD, morbid obesity, OSA, CAD s/p stent, CHF, anxiety/depression, chronic anemia, arthritis. 11/5, patient presented to ED for evaluation of sudden onset bilateral hearing loss.  On 10/31, patient began feeling sick with chest pain, fluid retention in the legs, headache. She was seen in the ED on 11/2, charged home on increased dose of torsemide .  She continued to have headache and intermittent fever. On 11/5, she woke up at 3 AM 'screaming and speaking in tongues' and then everything went silent.  While this was going on, she was having ringing in her ears that sound like loud church bells.  Everything then went completely quiet.  Around 10 AM, she noticed that the left side of her face, left arm and left leg had numbness.  In the ED, she had a fever of 100.4, heart rate 97, blood pressure 141/74, breathing on room air. Labs with WC count 9.8, hemoglobin 10.6, BUN/creatinine 22/2.08, glucose 243 Respiratory virus panel unremarkable. Blood culture sent CT head unremarkable MRI brain with and without contrast did not show any evidence of acute abnormality or abnormal enhancement. Seen by neurology in the ED  Recommended LP and empiric meningitis coverage. Status post successful fluoroscopic guided lumbar puncture. 11/06  Meningitis/Encephalitis panel negative. Dexamethasone   discontinued. Assessment and plan: Sudden onset bilateral hearing loss  Progressive headache  Timeline of symptoms and imagings as above  Underwent fluoroscopy guided LP by IR  Per  neurology, CSF sample to sendt for Protein and glucose, cell count on tubes 1 and 4, meningitis/encephalitis panel, IgG index, cryptococcal antigen, VDRL  CSF glucose level elevated, protein level not elevated Currently on empiric antibiotic coverage with IV Rocephin , IV vancomycin , acyclovir as well as Decadron . Initially had temperature 100.4.  No fever.  WBC count normal. Neurology Dr. 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 Recommended OP follow up  Discontinued antibiotics due to negative meningitis panel -ENT consulted for conductive hearing loss.  AKI Baseline creatinine less than 1 in June 2025. Presented with elevated creatinine of 2.15. Slightly better today at 1.3.  Continue to monitor  CHF HTN Most recent echo from 2023 with EF 65 to 70%, mild LVH, RV size and function normal. Echo reviewed Hemodynamically stable.  Looks euvolemic. PTA meds- torsemide  40 mg twice daily, hydralazine  100 mg 3 times daily, losartan  100 mg daily.  Not on beta-blocker. Currently all meds are on hold.  CAD s/p stent HLD PTA meds- aspirin  81 mg daily (not taking), Lipitor  20 mg daily, nitro sublingual.   Type 2 diabetes mellitus uncontrolled with hyperglycemia Diabetic neuropathy A1c 10.1 on 10/25/2024 Blood sugar level running elevated.  Patient is on high dose of dexamethasone  10 mg every 6 hours.  Guidelines state to continue it for 2 to 4 days.  Can DC if CSF studies rule out meningitis. PTA meds-Mounjaro, Lantus  25 units daily, high-dose Premeal U-500 insulin  Diabetes care coordinator consulted Has been started on Humulin  R U-500 25 units 3 times daily with meals,  monitor CBG Q4 with 0 to 20 units SSI Continue gabapentin  600 mg twice daily.    Mild chronic anemia GERD Hemoglobin stable Continue Pepcid   Hyponatremia Mild.  Continue to  monitor  Hyperkalemia Potassium level at 5.  Not on any potassium supplements or potassium sparing diuretic Repeat BMP at noon so some improvement.  Continue to monitor  Morbid Obesity  There is no height or weight on file to calculate BMI. Patient has been advised to make an attempt to improve diet and exercise patterns to aid in weight loss.  OSA On nocturnal CPAP  Arthritis PTA meds- Percocet 10/325 PRN, Zanaflex  PRN  Anxiety/depression PTA meds- Cymbalta  30 mg twice daily    Mobility: Encourage ambulation  Goals of care   Code Status: Full Code     DVT prophylaxis:  heparin  injection 5,000 Units Start: 10/25/24 2230   Antimicrobials: IV Rocephin , vancomycin , acyclovir Fluid: None Consultants: Neurology Family Communication: None at bedside  Status: Inpatient Level of care:  Telemetry   Patient is from: Home Needs to continue in-hospital care: Has bilateral hearing loss Anticipated d/c to: Pending clinical course   Diet:  Diet Order             Diet heart healthy/carb modified Fluid consistency: Thin  Diet effective now                   Scheduled Meds:  atorvastatin   20 mg Oral Daily   DULoxetine   30 mg Oral BID   famotidine   20 mg Oral BID   gabapentin   300 mg Oral TID   heparin   5,000 Units Subcutaneous Q8H   insulin  aspart  0-20 Units Subcutaneous Q4H   insulin  regular human CONCENTRATED  35 Units Subcutaneous TID WC    PRN meds: acetaminophen  **OR** acetaminophen , cyclobenzaprine , ondansetron  **OR** ondansetron  (ZOFRAN ) IV, oxyCODONE    Infusions:     Antimicrobials: Anti-infectives (From admission, onward)    Start     Dose/Rate Route Frequency Ordered Stop   10/26/24 2000  cefTRIAXone  (ROCEPHIN ) 2 g in sodium chloride  0.9 % 100 mL IVPB  Status:  Discontinued        2 g 200 mL/hr over 30 Minutes Intravenous Every 24 hours 10/25/24 2228 10/25/24 2238   10/26/24 2000  vancomycin  (VANCOREADY) IVPB 1500 mg/300 mL  Status:   Discontinued        1,500 mg 150 mL/hr over 120 Minutes Intravenous Every 24 hours 10/25/24 2310 10/27/24 1347   10/26/24 1000  cefTRIAXone  (ROCEPHIN ) 2 g in sodium chloride  0.9 % 100 mL IVPB  Status:  Discontinued        2 g 200 mL/hr over 30 Minutes Intravenous Every 12 hours 10/25/24 2238 10/27/24 1347   10/26/24 0800  acyclovir (ZOVIRAX) 1,000 mg in dextrose  5 % 250 mL IVPB  Status:  Discontinued        1,000 mg 270 mL/hr over 60 Minutes Intravenous Every 8 hours 10/25/24 2310 10/27/24 1347   10/25/24 1930  vancomycin  (VANCOCIN ) 2,500 mg in sodium chloride  0.9 % 500 mL IVPB        2,500 mg 262.5 mL/hr over 120 Minutes Intravenous  Once 10/25/24 1920 10/26/24 0124   10/25/24 1930  acyclovir (ZOVIRAX) 1,000 mg in dextrose  5 % 250 mL IVPB        1,000 mg 270 mL/hr over 60 Minutes Intravenous Once 10/25/24 1921 10/26/24 0330   10/25/24 1915  cefTRIAXone  (ROCEPHIN ) 2 g in sodium chloride  0.9 % 100 mL IVPB  2 g 200 mL/hr over 30 Minutes Intravenous  Once 10/25/24 1900 10/25/24 2153   10/25/24 1915  vancomycin  (VANCOCIN ) IVPB 1000 mg/200 mL premix  Status:  Discontinued        1,000 mg 200 mL/hr over 60 Minutes Intravenous  Once 10/25/24 1901 10/25/24 1919       Objective: Vitals:   10/27/24 2358 10/28/24 0421  BP: (!) 141/75 (!) 140/85  Pulse: (!) 59 (!) 59  Resp: 17 17  Temp: 98.2 F (36.8 C) 97.7 F (36.5 C)  SpO2: 98% 100%    Intake/Output Summary (Last 24 hours) at 10/28/2024 9072 Last data filed at 10/27/2024 2007 Gross per 24 hour  Intake 520 ml  Output --  Net 520 ml   There were no vitals filed for this visit. Weight change:  There is no height or weight on file to calculate BMI.   Physical Exam: General exam: Pleasant, morbidly obese young African-American female. Skin: No rashes, lesions or ulcers. HEENT: Atraumatic, normocephalic, no obvious bleeding Lungs: Clear to auscultation bilaterally,  CVS: S1, S2, no murmur,   GI/Abd: Soft, nontender,  nondistended, bowel sound present,   CNS: Alert, awake, unable to hear his communicates by writing.  Able to comprehend and verbalize Psychiatry: Mood appropriate Extremities: No pedal edema, no calf tenderness,   Data Review: I have personally reviewed the laboratory data and studies available.     ECHOCARDIOGRAM REPORT       Patient Name:   Lindsay [name] Date of Exam: 10/26/2024  Medical Rec #:  [id number]         Height:       67.0 in  Accession #:    [id number]        Weight:       373.0 lb  Date of Birth:  July 13, 1981         BSA:          2.636 m  Patient Age:    43 years          BP:           133/73 mmHg  Patient Gender: F                 HR:           85 bpm.  Exam Location:  Inpatient   Procedure: 2D Echo, Cardiac Doppler, Color Doppler and Intracardiac             Opacification Agent (Both Spectral and Color Flow Doppler were             utilized during procedure).   Indications:    Abnormal ECG 794.31 / R94.31    History:        Patient has prior history of Echocardiogram examinations,  most                 recent 08/15/2022. Risk Factors:Hypertension, Diabetes and  Sleep                 Apnea.    Sonographer:    Jayson Gaskins  Referring Phys: BINAYA DAHAL   IMPRESSIONS     1. Left ventricular ejection fraction, by estimation, is 65 to 70%. The  left ventricle has normal function. The left ventricle has no regional  wall motion abnormalities. Left ventricular diastolic function could not  be evaluated.   2. Right ventricular systolic function was not well visualized. The right  ventricular size is not well visualized.  3. The mitral valve is grossly normal. No evidence of mitral valve  regurgitation.   4. The aortic valve was not well visualized. Aortic valve regurgitation  is not visualized.   Conclusion(s)/Recommendation(s): Technically very limited study due to  very poor acoustic windows most structures not well seen. LV and RV appear  grossly normal.    FINDINGS   Left Ventricle: Left ventricular ejection fraction, by estimation, is 65  to 70%. The left ventricle has normal function. The left ventricle has no  regional wall motion abnormalities. The left ventricular internal cavity  size was normal in size. There is   no left ventricular hypertrophy. Left ventricular diastolic function  could not be evaluated.   Right Ventricle: The right ventricular size is not well visualized. Right  vetricular wall thickness was not well visualized. Right ventricular  systolic function was not well visualized.   Left Atrium: Left atrial size was not well visualized.   Right Atrium: Right atrial size was not well visualized.   Pericardium: There is no evidence of pericardial effusion.   Mitral Valve: The mitral valve is grossly normal. No evidence of mitral  valve regurgitation.   Tricuspid Valve: The tricuspid valve is grossly normal. Tricuspid valve  regurgitation is trivial.   Aortic Valve: The aortic valve was not well visualized. Aortic valve  regurgitation is not visualized.   Pulmonic Valve: The pulmonic valve was not well visualized. Pulmonic valve  regurgitation is not visualized.   Aorta: The aortic root was not well visualized.   Venous: The inferior vena cava was not well visualized.   IAS/Shunts: The interatrial septum was not well visualized.     LEFT VENTRICLE  PLAX 2D  LVIDd:         4.30 cm  LVIDs:         2.80 cm  LV PW:         1.00 cm  LV IVS:        1.00 cm  LVOT diam:     1.80 cm  LVOT Area:     2.54 cm       SHUNTS  Systemic Diam: 1.80 cm   Toribio Fuel MD  Electronically signed by Toribio Fuel MD  Signature Date/Time: 10/26/2024/11:36:26 PM  F/u labs ordered Unresulted Labs (From admission, onward)     Start     Ordered   10/26/24 1039  Fungus Culture With Stain  RELEASE UPON ORDERING,   TIMED        10/26/24 1039   10/26/24 1039  Acid Fast Culture with reflexed sensitivities  RELEASE  UPON ORDERING,   TIMED        10/26/24 1039   10/26/24 1039  Acid Fast Smear (AFB)  RELEASE UPON ORDERING,   TIMED        10/26/24 1039   10/25/24 1902  Oligoclonal bands, CSF + serum  (Meningitis Panel)  Once,   URGENT        10/25/24 1901            Signed, Landon FORBES Baller, MD [facility] 10/28/2024

## 2024-10-28 NOTE — Progress Notes (Signed)
   10/28/24 0002  BiPAP/CPAP/SIPAP  $ Non-Invasive Home Ventilator  Subsequent  BiPAP/CPAP/SIPAP Pt Type Adult  BiPAP/CPAP/SIPAP Resmed  Mask Type Full face mask  Mask Size Medium  FiO2 (%) 21 %  Patient Home Machine No  Patient Home Mask No  Patient Home Tubing No  Auto Titrate Yes  Minimum cmH2O 6 cmH2O  Maximum cmH2O 18 cmH2O  Device Plugged into RED Power Outlet Yes

## 2024-10-28 NOTE — Progress Notes (Signed)
 NEUROLOGY CONSULT FOLLOW UP NOTE   Date of service: October 28, 2024 Patient Name: Lindsay [name] MRN:  [id number] DOB:  1981/11/16  Interval Hx/subjective   Patient is seen in her room with no family members at the bedside.  She has been hemodynamically stable and afebrile overnight.  She reports stable hearing in her left ear without change from yesterday continuing to sound as though she is in a cave and she still cannot hear anything with her right ear. She reports resolution of her migraine headache.   Vitals   Vitals:   10/27/24 1550 10/27/24 2007 10/27/24 2358 10/28/24 0421  BP: (!) 151/86 (!) 159/88 (!) 141/75 (!) 140/85  Pulse: 72 75 (!) 59 (!) 59  Resp: 18 18 17 17   Temp: 97.8 F (36.6 C) 97.9 F (36.6 C) 98.2 F (36.8 C) 97.7 F (36.5 C)  TempSrc: Oral Oral Oral Oral  SpO2: 100% 100% 98% 100%    There is no height or weight on file to calculate BMI.  Physical Exam   Constitutional: Appears well-developed and well-nourished.  Psych: Affect appropriate to situation.  Eyes: No scleral injection.  HENT: No OP obstrucion.  Head: Normocephalic.  Cardiovascular: Normal rate and regular rhythm.  Respiratory: Effort normal, non-labored breathing.  Skin: WDI.   Neurologic Examination   Mental Status: AA&Ox3, able to give clear and coherent history of present illness Speech/Language: speech is without dysarthria or aphasia.   Cranial Nerves:  II: PERRL.  III, IV, VI: EOMI. Eyelids elevate symmetrically.  V: Sensation is intact to light touch and symmetrical to face.  VII: Smile is symmetrical.  VIII: Hearing intact to voice on the left, reported absent on the right. IX, X:  Phonation is normal.  KP:Dynloizm shrug 5/5. XII: tongue is midline without fasciculations. Motor: 5/5 strength to all muscle groups tested.  Tone: is normal and bulk is normal Sensation- Intact to light touch bilaterally and symmetrical in bilateral arms.  Some paresthesias and decreased  sensation noted in left lower extremity Coordination: FTN intact bilaterally Gait- Deferred  Medications  Current Facility-Administered Medications:    acetaminophen  (TYLENOL ) tablet 650 mg, 650 mg, Oral, Q6H PRN, 650 mg at 10/26/24 0435 **OR** acetaminophen  (TYLENOL ) suppository 650 mg, 650 mg, Rectal, Q6H PRN, Krugh, Marissa C, DO   atorvastatin  (LIPITOR ) tablet 20 mg, 20 mg, Oral, Daily, Dahal, Binaya, MD, 20 mg at 10/27/24 0920   cyclobenzaprine  (FLEXERIL ) tablet 10 mg, 10 mg, Oral, TID PRN, Dibia, Pauline E, MD   DULoxetine  (CYMBALTA ) DR capsule 30 mg, 30 mg, Oral, BID, Dahal, Binaya, MD, 30 mg at 10/27/24 2156   famotidine  (PEPCID ) tablet 20 mg, 20 mg, Oral, BID, Dahal, Binaya, MD, 20 mg at 10/27/24 2156   gabapentin  (NEURONTIN ) capsule 300 mg, 300 mg, Oral, TID, Dibia, Pauline E, MD   heparin  injection 5,000 Units, 5,000 Units, Subcutaneous, Q8H, Krugh, Marissa C, DO, 5,000 Units at 10/28/24 0606   insulin  aspart (novoLOG ) injection 0-20 Units, 0-20 Units, Subcutaneous, Q4H, Mansy, Jan A, MD, 4 Units at 10/28/24 0442   insulin  regular human CONCENTRATED (HUMULIN  R) 500 UNIT/ML KwikPen 35 Units, 35 Units, Subcutaneous, TID WC, Dibia, Pauline E, MD, 35 Units at 10/27/24 1557   ondansetron  (ZOFRAN ) tablet 4 mg, 4 mg, Oral, Q6H PRN **OR** ondansetron  (ZOFRAN ) injection 4 mg, 4 mg, Intravenous, Q6H PRN, Krugh, Marissa C, DO   oxyCODONE  (Oxy IR/ROXICODONE ) immediate release tablet 10 mg, 10 mg, Oral, Q6H PRN, Trifan, Matthew J, MD, 10 mg at 10/28/24 0441  Labs and Diagnostic Imaging   CBC:  Recent Labs  Lab 10/25/24 1629 10/25/24 2356 10/26/24 0551 10/27/24 0410  WBC 9.8   < > 7.0 11.2*  NEUTROABS 6.4  --   --  9.2*  HGB 10.6*   < > 10.3* 9.1*  HCT 35.9*   < > 33.5* 29.2*  MCV 85.5   < > 84.2 82.7  PLT 282   < > 248 298   < > = values in this interval not displayed.   Basic Metabolic Panel:  Lab Results  Component Value Date   NA 131 (L) 10/27/2024   K 5.0 10/27/2024   CO2  23 10/27/2024   GLUCOSE 334 (H) 10/27/2024   BUN 31 (H) 10/27/2024   CREATININE 1.30 (H) 10/27/2024   CALCIUM  8.3 (L) 10/27/2024   GFRNONAA 52 (L) 10/27/2024   GFRAA 98 12/05/2020   Lipid Panel:  Lab Results  Component Value Date   LDLCALC 100 (H) 03/15/2024   HgbA1c:  Lab Results  Component Value Date   HGBA1C 10.1 (H) 10/25/2024   Urine Drug Screen:     Component Value Date/Time   LABOPIA NEGATIVE 06/11/2011 0914   COCAINSCRNUR NEGATIVE 06/11/2011 0914   LABBENZ NEGATIVE 06/11/2011 0914   AMPHETMU NEGATIVE 06/11/2011 0914    Alcohol  Level No results found for: [facility] INR  Lab Results  Component Value Date   INR 1.1 07/10/2021   APTT  Lab Results  Component Value Date   APTT 31 07/10/2021   CSF glucose 171 CSF protein 29 CSF RBC 147 in tube 1, 15 in tube 4 CSF WBC 2 in tube 1, 2 in tube 4 Meningitis/encephalitis panel negative Cryptococcal antigen negative HIV negative CSF VDRL Non reactive CSF angiotensin-converting enzyme WNL at <1.5 (Ref range 0.0-2.5 U/L)  Pending labs:  AFB smear  AFB culture  Fungus culture Oligoclonal bands  CT Head without contrast (Personally reviewed): No acute abnormality  MRI Brain (Personally reviewed): No acute abnormality  Assessment  Lindsay [name] is a 43 y.o. female with history of anemia, renal failure, asthma, depression, diabetes, hypertension, hyperlipidemia, CAD, sleep apnea and obesity who presents with acute onset headache, bilateral acute hearing loss and light sensitivity.  Patient reports having a holocephalic headache for 6 days, then on 11/4, she awoke screaming and speaking in tongues and then experienced hearing loss.  Prior to the hearing loss, she did have ringing in her ears sounding like loud church bells, followed by a screeching sound, and then she became unable to hear anything.  On 11/5, she noticed that she had left-sided numbness with paresthesias to the left side of her face; she then presented  to the ED for evaluation.   - Patient reports stable hearing in her left ear, although it sounds as though she is in a cave but she still cannot hear anything with her right ear (same as yesterday without improvement or worsening).  She reports no further tingling or numbness on the left face, arm, or leg today.  - Exam today is with stable subjective return of hearing in left ear. No focal weakness.  She is able to respond to examiner's normal-tone speech as well as lowered speech volumes throughout assessment and even when examiner's back is turned today without the possibility for lip reading. This discrepancy between her actual and stated hearing ability suggests possible functional overlay.  - Lumbar puncture reveals elevated opening pressure of 30 with closing pressure of 23, but procedure was performed in prone  position, and this pressure is likely artifactual given her morbid obesity, which when lying prone typically results in a physiological transient increase in ICP as measured by LP.   - Audiogram with brainstem evoked potentials at the bedside yesterday with concerns for conductive hearing loss bilaterally, though a more complete audiology evaluation is recommended outpatient.  - MRI brain with and without contrast was negative for any acute abnormalities.  She is receiving empiric antibiotics and antimicrobials for possible CNS infection, although meningitis/encephalitis panel was negative.  Patient was on dexamethasone  for possible meningitis, but this was discontinued due to hyperglycemia.  Recommendations  - Recommend ENT consult for evaluation of acute bilateral conductive hearing loss  - Follow up with pending labs per hospitalist team. No further inpatient neurology work up indicated at this time with initial work up unremarkable. Low suspicion for neurological-related etiologies for hearing loss at this time. Please contact neurologist on call for further neurology related questions or  concerns. ______________________________________________________________________ Patient seen by NP and discussed with Neurology attending  Signed, Stevi W Toberman, NP [facility]   Electronically signed: Dr. Sakinah Rosamond

## 2024-10-29 DIAGNOSIS — H900 Conductive hearing loss, bilateral: Secondary | ICD-10-CM | POA: Diagnosis not present

## 2024-10-29 DIAGNOSIS — R509 Fever, unspecified: Secondary | ICD-10-CM | POA: Diagnosis not present

## 2024-10-29 DIAGNOSIS — R519 Headache, unspecified: Secondary | ICD-10-CM

## 2024-10-29 DIAGNOSIS — H9193 Unspecified hearing loss, bilateral: Secondary | ICD-10-CM | POA: Diagnosis not present

## 2024-10-29 DIAGNOSIS — N179 Acute kidney failure, unspecified: Secondary | ICD-10-CM | POA: Diagnosis not present

## 2024-10-29 LAB — GLUCOSE, CAPILLARY
Glucose-Capillary: 124 mg/dL — ABNORMAL HIGH (ref 70–99)
Glucose-Capillary: 155 mg/dL — ABNORMAL HIGH (ref 70–99)
Glucose-Capillary: 259 mg/dL — ABNORMAL HIGH (ref 70–99)
Glucose-Capillary: 267 mg/dL — ABNORMAL HIGH (ref 70–99)
Glucose-Capillary: 62 mg/dL — ABNORMAL LOW (ref 70–99)
Glucose-Capillary: 90 mg/dL (ref 70–99)

## 2024-10-29 LAB — CSF CULTURE W GRAM STAIN
Culture: NO GROWTH
Special Requests: NORMAL

## 2024-10-29 LAB — COMPREHENSIVE METABOLIC PANEL WITH GFR
ALT: 20 U/L (ref 0–44)
AST: 38 U/L (ref 15–41)
Albumin: 2.7 g/dL — ABNORMAL LOW (ref 3.5–5.0)
Alkaline Phosphatase: 67 U/L (ref 38–126)
Anion gap: 16 — ABNORMAL HIGH (ref 5–15)
BUN: 23 mg/dL — ABNORMAL HIGH (ref 6–20)
CO2: 24 mmol/L (ref 22–32)
Calcium: 8.9 mg/dL (ref 8.9–10.3)
Chloride: 99 mmol/L (ref 98–111)
Creatinine, Ser: 1.04 mg/dL — ABNORMAL HIGH (ref 0.44–1.00)
GFR, Estimated: >60 mL/min (ref >60)
Glucose, Bld: 64 mg/dL — ABNORMAL LOW (ref 70–99)
Potassium: 4.1 mmol/L (ref 3.5–5.1)
Sodium: 139 mmol/L (ref 135–145)
Total Bilirubin: 0.4 mg/dL (ref 0.0–1.2)
Total Protein: 6.3 g/dL — ABNORMAL LOW (ref 6.5–8.1)

## 2024-10-29 MED ORDER — INSULIN REGULAR HUMAN (CONC) 500 UNIT/ML ~~LOC~~ SOPN
30.0000 [IU] | PEN_INJECTOR | Freq: Three times a day (TID) | SUBCUTANEOUS | Status: DC
  Administered 2024-10-30: 30 [IU] via SUBCUTANEOUS
  Filled 2024-10-29: qty 3

## 2024-10-29 NOTE — Plan of Care (Signed)

## 2024-10-29 NOTE — Progress Notes (Signed)
 PROGRESS NOTE  Lindsay [name]  DOB: Jul 18, 1981  PCP: Celestia Rosaline SQUIBB, NP FMW:[id number]  DOA: 10/25/2024  LOS: 3 days  Hospital Day: 5  Subjective: Patient seen at bedside this morning, she reports fatigue. Hearing has been slowly improving, she has not used the head set provided by the audiologist.     Brief narrative: Lindsay [name] is a 43 y.o. female with PMH significant for DM2, HTN, HLD, morbid obesity, OSA, CAD s/p stent, CHF, anxiety/depression, chronic anemia, arthritis. 11/5, patient presented to ED for evaluation of sudden onset bilateral hearing loss.  On 10/31, patient began feeling sick with chest pain, fluid retention in the legs, headache. She was seen in the ED on 11/2, charged home on increased dose of torsemide .  She continued to have headache and intermittent fever. On 11/5, she woke up at 3 AM 'screaming and speaking in tongues' and then everything went silent.  While this was going on, she was having ringing in her ears that sound like loud church bells.  Everything then went completely quiet.  Around 10 AM, she noticed that the left side of her face, left arm and left leg had numbness.  In the ED, she had a fever of 100.4, heart rate 97, blood pressure 141/74, breathing on room air. Labs with WC count 9.8, hemoglobin 10.6, BUN/creatinine 22/2.08, glucose 243 Respiratory virus panel unremarkable. Blood culture sent CT head unremarkable MRI brain with and without contrast did not show any evidence of acute abnormality or abnormal enhancement. Seen by neurology in the ED  Recommended LP and empiric meningitis coverage. Status post successful fluoroscopic guided lumbar puncture. 11/06  Meningitis/Encephalitis panel negative. Dexamethasone   discontinued. Assessment and plan: Sudden onset bilateral hearing loss  Progressive headache  Timeline of symptoms and imagings as above  Underwent fluoroscopy guided LP by IR  Per neurology, CSF sample to sendt  for Protein and glucose, cell count on tubes 1 and 4, meningitis/encephalitis panel, IgG index, cryptococcal antigen, VDRL  CSF glucose level elevated, protein level not elevated Received  empiric antibiotic coverage with IV Rocephin , IV vancomycin , acyclovir as well as Decadron - now discontinued. Initially had temperature 100.4.  No fever.  WBC count normal. Neurology Dr. 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 Recommended OP follow up  Discontinued antibiotics due to negative meningitis panel -ENT consulted for conductive hearing loss. 11/9-ENT note reviewed, he recommends outpatient hearing evaluation.  AKI Baseline creatinine less than 1 in June 2025. Presented with elevated creatinine of 2.15. Slightly better today at 1.3.  Continue to monitor  CHF HTN Most recent echo from 2023 with EF 65 to 70%, mild LVH, RV size and function normal. Echo reviewed Hemodynamically stable.  Looks euvolemic. PTA meds- torsemide  40 mg twice daily, hydralazine  100 mg 3 times daily, losartan  100 mg daily.  Not on beta-blocker. Resumed BP medications.  CAD s/p stent HLD PTA meds- aspirin  81 mg daily (not taking), Lipitor  20 mg daily, nitro sublingual.   Type 2 diabetes mellitus uncontrolled with hyperglycemia Diabetic neuropathy A1c 10.1 on 10/25/2024 Blood sugar level running elevated.  Patient is on high dose of dexamethasone  10 mg every 6 hours.  Guidelines state to continue it for 2 to 4 days.  Can DC if CSF studies rule out meningitis. PTA meds-Mounjaro, Lantus  25 units daily, high-dose Premeal U-500 insulin  Diabetes care coordinator consulted Has been started on Humulin  R U-500 25 units  3 times daily with meals, monitor CBG Q4 with 0 to 20 units SSI Continue gabapentin  300 mg tid daily.    Mild chronic anemia GERD Hemoglobin stable Continue  Pepcid   Hyponatremia Mild.  Continue to monitor  Hyperkalemia-resolved Potassium level at 5.  Not on any potassium supplements or potassium sparing diuretic Repeat BMP at noon so some improvement.  Continue to monitor  Morbid Obesity  There is no height or weight on file to calculate BMI. Patient has been advised to make an attempt to improve diet and exercise patterns to aid in weight loss.  OSA On nocturnal CPAP  Arthritis PTA meds- Percocet 10/325 PRN, Zanaflex  PRN  Anxiety/depression PTA meds- Cymbalta  30 mg twice daily    Mobility: Encourage ambulation  Goals of care   Code Status: Full Code     DVT prophylaxis:  heparin  injection 5,000 Units Start: 10/25/24 2230   Antimicrobials: IV Rocephin , vancomycin , acyclovir Fluid: None Consultants: Neurology, ENT Family Communication: None at bedside  Status: Inpatient Level of care:  Telemetry   Patient is from: Home Needs to continue in-hospital care: Has bilateral hearing loss Anticipated d/c to: Pending clinical course   Diet:  Diet Order             Diet heart healthy/carb modified Fluid consistency: Thin  Diet effective now                   Scheduled Meds:  atorvastatin   20 mg Oral Daily   DULoxetine   30 mg Oral BID   famotidine   20 mg Oral BID   gabapentin   300 mg Oral TID   heparin   5,000 Units Subcutaneous Q8H   hydrALAZINE   100 mg Oral Q8H   insulin  aspart  0-20 Units Subcutaneous Q4H   insulin  regular human CONCENTRATED  35 Units Subcutaneous TID WC   torsemide   40 mg Oral BID    PRN meds: acetaminophen  **OR** acetaminophen , cyclobenzaprine , ondansetron  **OR** ondansetron  (ZOFRAN ) IV, oxyCODONE    Infusions:     Antimicrobials: Anti-infectives (From admission, onward)    Start     Dose/Rate Route Frequency Ordered Stop   10/26/24 2000  cefTRIAXone  (ROCEPHIN ) 2 g in sodium chloride  0.9 % 100 mL IVPB  Status:  Discontinued        2 g 200 mL/hr over 30 Minutes Intravenous Every  24 hours 10/25/24 2228 10/25/24 2238   10/26/24 2000  vancomycin  (VANCOREADY) IVPB 1500 mg/300 mL  Status:  Discontinued        1,500 mg 150 mL/hr over 120 Minutes Intravenous Every 24 hours 10/25/24 2310 10/27/24 1347   10/26/24 1000  cefTRIAXone  (ROCEPHIN ) 2 g in sodium chloride  0.9 % 100 mL IVPB  Status:  Discontinued        2 g 200 mL/hr over 30 Minutes Intravenous Every 12 hours 10/25/24 2238 10/27/24 1347   10/26/24 0800  acyclovir (ZOVIRAX) 1,000 mg in dextrose  5 % 250 mL IVPB  Status:  Discontinued        1,000 mg 270 mL/hr over 60 Minutes Intravenous Every 8 hours 10/25/24 2310 10/27/24 1347   10/25/24 1930  vancomycin  (VANCOCIN ) 2,500 mg in sodium chloride  0.9 % 500 mL IVPB        2,500 mg 262.5 mL/hr over 120 Minutes Intravenous  Once 10/25/24 1920 10/26/24 0124   10/25/24 1930  acyclovir (ZOVIRAX) 1,000 mg in dextrose  5 % 250 mL IVPB        1,000 mg 270 mL/hr over 60 Minutes Intravenous Once  10/25/24 1921 10/26/24 0330   10/25/24 1915  cefTRIAXone  (ROCEPHIN ) 2 g in sodium chloride  0.9 % 100 mL IVPB        2 g 200 mL/hr over 30 Minutes Intravenous  Once 10/25/24 1900 10/25/24 2153   10/25/24 1915  vancomycin  (VANCOCIN ) IVPB 1000 mg/200 mL premix  Status:  Discontinued        1,000 mg 200 mL/hr over 60 Minutes Intravenous  Once 10/25/24 1901 10/25/24 1919       Objective: Vitals:   10/29/24 0628 10/29/24 0807  BP: (!) 157/85 125/62  Pulse:  78  Resp:    Temp:  97.7 F (36.5 C)  SpO2:  99%   No intake or output data in the 24 hours ending 10/29/24 1002  There were no vitals filed for this visit. Weight change:  There is no height or weight on file to calculate BMI.   Physical Exam: General exam: Pleasant, morbidly obese young African-American female. Skin: No rashes, lesions or ulcers. HEENT: Atraumatic, normocephalic, no obvious bleeding Lungs: Clear to auscultation bilaterally,  CVS: S1, S2, no murmur,   GI/Abd: Soft, nontender, nondistended, bowel sound  present,   CNS: Alert, awake, unable to hear his communicates by writing.  Able to comprehend and verbalize Psychiatry: Mood appropriate Extremities: No pedal edema, no calf tenderness,   Data Review: I have personally reviewed the laboratory data and studies available.     ECHOCARDIOGRAM REPORT       Patient Name:   AMIKA TASSIN Date of Exam: 10/26/2024  Medical Rec #:  [id number]         Height:       67.0 in  Accession #:    [id number]        Weight:       373.0 lb  Date of Birth:  [birth year]         BSA:          2.636 m  Patient Age:    43 years          BP:           133/73 mmHg  Patient Gender: F                 HR:           85 bpm.  Exam Location:  Inpatient   Procedure: 2D Echo, Cardiac Doppler, Color Doppler and Intracardiac             Opacification Agent (Both Spectral and Color Flow Doppler were             utilized during procedure).   Indications:    Abnormal ECG 794.31 / R94.31    History:        Patient has prior history of Echocardiogram examinations,  most                 recent 08/15/2022. Risk Factors:Hypertension, Diabetes and  Sleep                 Apnea.    Sonographer:    Jayson Gaskins  Referring Phys: BINAYA DAHAL   IMPRESSIONS     1. Left ventricular ejection fraction, by estimation, is 65 to 70%. The  left ventricle has normal function. The left ventricle has no regional  wall motion abnormalities. Left ventricular diastolic function could not  be evaluated.   2. Right ventricular systolic function was not well visualized. The right  ventricular size is not well  visualized.   3. The mitral valve is grossly normal. No evidence of mitral valve  regurgitation.   4. The aortic valve was not well visualized. Aortic valve regurgitation  is not visualized.   Conclusion(s)/Recommendation(s): Technically very limited study due to  very poor acoustic windows most structures not well seen. LV and RV appear  grossly normal.   FINDINGS   Left  Ventricle: Left ventricular ejection fraction, by estimation, is 65  to 70%. The left ventricle has normal function. The left ventricle has no  regional wall motion abnormalities. The left ventricular internal cavity  size was normal in size. There is   no left ventricular hypertrophy. Left ventricular diastolic function  could not be evaluated.   Right Ventricle: The right ventricular size is not well visualized. Right  vetricular wall thickness was not well visualized. Right ventricular  systolic function was not well visualized.   Left Atrium: Left atrial size was not well visualized.   Right Atrium: Right atrial size was not well visualized.   Pericardium: There is no evidence of pericardial effusion.   Mitral Valve: The mitral valve is grossly normal. No evidence of mitral  valve regurgitation.   Tricuspid Valve: The tricuspid valve is grossly normal. Tricuspid valve  regurgitation is trivial.   Aortic Valve: The aortic valve was not well visualized. Aortic valve  regurgitation is not visualized.   Pulmonic Valve: The pulmonic valve was not well visualized. Pulmonic valve  regurgitation is not visualized.   Aorta: The aortic root was not well visualized.   Venous: The inferior vena cava was not well visualized.   IAS/Shunts: The interatrial septum was not well visualized.     LEFT VENTRICLE  PLAX 2D  LVIDd:         4.30 cm  LVIDs:         2.80 cm  LV PW:         1.00 cm  LV IVS:        1.00 cm  LVOT diam:     1.80 cm  LVOT Area:     2.54 cm       SHUNTS  Systemic Diam: 1.80 cm   Toribio Fuel MD  Electronically signed by Toribio Fuel MD  Signature Date/Time: 10/26/2024/11:36:26 PM  F/u labs ordered Unresulted Labs (From admission, onward)     Start     Ordered   10/26/24 1039  Fungus Culture With Stain  RELEASE UPON ORDERING,   TIMED        10/26/24 1039   10/26/24 1039  Acid Fast Culture with reflexed sensitivities  RELEASE UPON ORDERING,    TIMED        10/26/24 1039   10/26/24 1039  Acid Fast Smear (AFB)  RELEASE UPON ORDERING,   TIMED        10/26/24 1039   10/25/24 1902  Oligoclonal bands, CSF + serum  (Meningitis Panel)  Once,   URGENT        10/25/24 1901            Signed, Landon FORBES Baller, MD [facility] 10/29/2024

## 2024-10-29 NOTE — Progress Notes (Signed)
 Patient ID: Lindsay [name], female   DOB: 22-Apr-1981, 43 y.o.   MRN: [id number]  Subjective: Hearing about the same no drainage or pain.   Objective: Vital signs in last 24 hours: Temp:  [97.7 F (36.5 C)-98.7 F (37.1 C)] 97.7 F (36.5 C) (11/09 0807) Pulse Rate:  [63-80] 78 (11/09 0807) Resp:  [17-19] 17 (11/09 0425) BP: (125-159)/(62-87) 125/62 (11/09 0807) SpO2:  [98 %-100 %] 99 % (11/09 0807) FiO2 (%):  [21 %] 21 % (11/08 2001)  No change in the exam. She is hearing in the dark room well without being able to lip read.     @LABLAST2 (wbc:2,hgb:2,hct:2,plt:2) Recent Labs    10/28/24 1040 10/29/24 0626  NA 137 139  K 4.6 4.1  CL 98 99  CO2 24 24  GLUCOSE 128* 64*  BUN 25* 23*  CREATININE 1.10* 1.04*  CALCIUM  8.9 8.9   No intake or output data in the 24 hours ending 10/29/24 1127   Medications: I have reviewed the patient's current medications.  Assessment/Plan: Hearing loss- again she needs follow up audiogram and outpatient visit soon as discharged. She seems to be able to function well with the hearing able to understand and respond to me in the room well.   LOS: 3 days   Norleen Notice 10/29/2024, 11:27 AM

## 2024-10-29 NOTE — Progress Notes (Signed)
   10/29/24 2322  BiPAP/CPAP/SIPAP  BiPAP/CPAP/SIPAP Pt Type Adult  BiPAP/CPAP/SIPAP Resmed  Mask Type Full face mask  Mask Size Medium  FiO2 (%) 21 %  Patient Home Machine No  Patient Home Mask No  Patient Home Tubing No  Auto Titrate Yes  Minimum cmH2O 6 cmH2O  CPAP/SIPAP surface wiped down Yes  Device Plugged into RED Power Outlet Yes  BiPAP/CPAP /SiPAP Vitals  Bilateral Breath Sounds Diminished

## 2024-10-30 DIAGNOSIS — R519 Headache, unspecified: Secondary | ICD-10-CM | POA: Diagnosis not present

## 2024-10-30 LAB — GLUCOSE, CAPILLARY
Glucose-Capillary: 185 mg/dL — ABNORMAL HIGH (ref 70–99)
Glucose-Capillary: 185 mg/dL — ABNORMAL HIGH (ref 70–99)
Glucose-Capillary: 271 mg/dL — ABNORMAL HIGH (ref 70–99)

## 2024-10-30 NOTE — Plan of Care (Signed)

## 2024-10-30 NOTE — Discharge Summary (Signed)
 Physician Discharge Summary   Patient: Lindsay [name] MRN: [id number] DOB: 1981-11-20  Admit date:     10/25/2024  Discharge date: 10/30/24  Discharge Physician: Landon BRAVO Kaelyn Innocent   PCP: Celestia Rosaline SQUIBB, NP   Recommendations at discharge:    Follow up with ENT and PCP  Discharge Diagnoses: Principal Problem:   Hearing loss Active Problems:   Nonintractable headache   Fever  Resolved Problems:   * No resolved hospital problems. *  Hospital Course: Lindsay [name] is a 43 y.o. female with PMH significant for DM2, HTN, HLD, morbid obesity, OSA, CAD s/p stent, CHF, anxiety/depression, chronic anemia, arthritis. 11/5, patient presented to ED for evaluation of sudden onset bilateral hearing loss.   On 10/31, patient began feeling sick with chest pain, fluid retention in the legs, headache. She was seen in the ED on 11/2, charged home on increased dose of torsemide .  She continued to have headache and intermittent fever. On 11/5, she woke up at 3 AM 'screaming and speaking in tongues' and then everything went silent.  While this was going on, she was having ringing in her ears that sound like loud church bells.  Everything then went completely quiet.  Around 10 AM, she noticed that the left side of her face, left arm and left leg had numbness.   In the ED, she had a fever of 100.4, heart rate 97, blood pressure 141/74, breathing on room air. Labs with WC count 9.8, hemoglobin 10.6, BUN/creatinine 22/2.08, glucose 243 Respiratory virus panel unremarkable. Blood culture sent CT head unremarkable MRI brain with and without contrast did not show any evidence of acute abnormality or abnormal enhancement. Seen by neurology in the ED  Recommended LP and empiric meningitis coverage. Status post successful fluoroscopic guided lumbar puncture. 11/06  Meningitis/Encephalitis panel negative. Dexamethasone   discontinued. Was seen by audiology nurse patient with a conductive hearing loss.    ENT evaluated and recommended other outpatient hearing evaluation follow-up  Assessment and Plan: Sudden onset bilateral hearing loss  Progressive headache  Timeline of symptoms and imagings as above  Underwent fluoroscopy guided LP by IR  Per neurology, CSF sample to sendt for Protein and glucose, cell count on tubes 1 and 4, meningitis/encephalitis panel, IgG index, cryptococcal antigen, VDRL  CSF glucose level elevated, protein level not elevated Received  empiric antibiotic coverage with IV Rocephin , IV vancomycin , acyclovir as well as Decadron - now discontinued. Initially had temperature 100.4.  No fever.  WBC count normal. Neurology Dr. 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 Recommended OP follow up  Discontinued antibiotics due to negative meningitis panel -ENT consulted for conductive hearing loss. 11/9-ENT note reviewed, he recommends outpatient hearing evaluation.   AKI Baseline creatinine less than 1 in June 2025. Presented with elevated creatinine of 2.15. Slightly better today at 1.3.  Continue to monitor   CHF HTN Most recent echo from 2023 with EF 65 to 70%, mild LVH, RV size and function normal. Echo reviewed Hemodynamically stable.  Looks euvolemic. PTA meds- torsemide  40 mg twice daily, hydralazine  100 mg 3 times daily, losartan  100 mg daily.  Not on beta-blocker. Resumed BP medications.   CAD s/p stent HLD PTA meds- aspirin  81 mg daily (not taking), Lipitor  20 mg daily, nitro sublingual.     Type 2 diabetes mellitus uncontrolled with hyperglycemia Diabetic neuropathy A1c 10.1 on 10/25/2024 Blood sugar level running elevated.  Patient  is on high dose of dexamethasone  10 mg every 6 hours.  Guidelines state to continue it for 2 to 4 days.  Can DC if CSF studies rule out meningitis. PTA meds-Mounjaro, Lantus  25  units daily, high-dose Premeal U-500 insulin  Diabetes care coordinator consulted Has been started on Humulin  R U-500 25 units 3 times daily with meals, monitor CBG Q4 with 0 to 20 units SSI Continue gabapentin  300 mg tid daily.     Mild chronic anemia GERD Hemoglobin stable Continue Pepcid    Hyponatremia Mild.  Continue to monitor   Hyperkalemia-resolved Potassium level at 5.  Not on any potassium supplements or potassium sparing diuretic Repeat BMP at noon so some improvement.  Continue to monitor   Morbid Obesity  There is no height or weight on file to calculate BMI. Patient has been advised to make an attempt to improve diet and exercise patterns to aid in weight loss.   OSA On nocturnal CPAP   Arthritis PTA meds- Percocet 10/325 PRN, Zanaflex  PRN   Anxiety/depression PTA meds- Cymbalta  30 mg twice daily            Consultants: ent, neurology Procedures performed: Lumbar puncture Disposition: Home Diet recommendation:  Carb modified diet DISCHARGE MEDICATION: Allergies as of 10/30/2024       Reactions   Hydrazine Yellow [fd&c Yellow #5 (tartrazine)] Shortness Of Breath, Swelling   Swelling mostly noticed in legs and feet, retaining urination, shortness of breaht, and minor chest pain   Lisinopril  Shortness Of Breath   Was on prinzide ; had sob/chest pain on it.   Pineapple Itching   Pineapple Extract         Medication List     TAKE these medications    acetaminophen  500 MG tablet Commonly known as: TYLENOL  Take 1,000 mg by mouth in the morning and at bedtime.   albuterol  108 (90 Base) MCG/ACT inhaler Commonly known as: VENTOLIN  HFA Inhale 2 puffs into the lungs every 6 (six) hours as needed for wheezing or shortness of breath.   allopurinol 100 MG tablet Commonly known as: ZYLOPRIM Take 100 mg by mouth daily.   aspirin  81 MG tablet Take 1 tablet (81 mg total) by mouth daily.   atorvastatin  20 MG tablet Commonly known as: LIPITOR  Take 20  mg by mouth daily.   colchicine 0.6 MG tablet Take 0.6 mg by mouth daily as needed (Gout).   DULoxetine  30 MG capsule Commonly known as: CYMBALTA  Take 1 capsule (30 mg total) by mouth 2 (two) times daily.   ELDERBERRY IMMUNE HEALTH GUMMY PO Take 2 tablets by mouth daily.   famotidine  20 MG tablet Commonly known as: PEPCID  Take 1 tablet (20 mg total) by mouth 2 (two) times daily. What changed:  when to take this reasons to take this   gabapentin  300 MG capsule Commonly known as: NEURONTIN  Take 2 capsules (600 mg total) by mouth 3 (three) times daily. What changed: when to take this   HumuLIN  R U-500 KwikPen 500 UNIT/ML KwikPen Generic drug: insulin  regular human CONCENTRATED Inject 75-85 Units into the skin in the morning, at noon, and at bedtime. Inject 85 units into the skin in the morning, 85 units in the afternoon and 75-80 units at night   hydrALAZINE  100 MG tablet Commonly known as: APRESOLINE  TAKE 1 TABLET(100 MG) BY MOUTH THREE TIMES DAILY   isosorbide  mononitrate 60 MG 24 hr tablet Commonly known as: IMDUR  Take 1 tablet (60 mg total) by mouth daily.   Lantus  SoloStar 100  UNIT/ML Solostar Pen Generic drug: insulin  glargine SMARTSIG:25 Unit(s) SUB-Q Every Morning   losartan  100 MG tablet Commonly known as: COZAAR  TAKE 1 TABLET(100 MG) BY MOUTH DAILY   Mounjaro 5 MG/0.5ML Pen Generic drug: tirzepatide Inject 5 mg into the skin once a week.   nitroGLYCERIN  0.4 MG SL tablet Commonly known as: Nitrostat  Place 1 tablet (0.4 mg total) under the tongue every 5 (five) minutes as needed for chest pain. Please keep upcoming appt in October 2022. Final Attempt   NyQuil Severe Cold/Flu 5-6.25-10-325 MG/15ML Liqd Generic drug: Phenyleph-Doxylamine-DM-APAP Take 30 mLs by mouth See admin instructions. Take 30ml by mouth every 6 to 8 hours as needed for cold/flu symptoms   ondansetron  4 MG tablet Commonly known as: ZOFRAN  Take 1 tablet (4 mg total) by mouth every 6  (six) hours.   oxyCODONE -acetaminophen  10-325 MG tablet Commonly known as: PERCOCET Take 1 tablet by mouth 3 (three) times daily as needed.   tiZANidine  4 MG tablet Commonly known as: Zanaflex  Take 1 tablet (4 mg total) by mouth every 6 (six) hours as needed for muscle spasms.   torsemide  20 MG tablet Commonly known as: DEMADEX  Take 2 tablets (40 mg total) by mouth 2 (two) times daily.        Follow-up Information     Celestia Rosaline SQUIBB, NP Follow up.   Specialty: Internal Medicine Contact information: [address] [place] [address] [phone]         Nelle Reyes NOVAK, MD Follow up.   Specialty: Otolaryngology               Discharge Exam: Filed Weights   10/29/24 1203  Weight: (!) 169.2 kg  General exam: Pleasant, morbidly obese young African-American female. Skin: No rashes, lesions or ulcers. HEENT: Atraumatic, normocephalic, no obvious bleeding Lungs: Clear to auscultation bilaterally,  CVS: S1, S2, no murmur,   GI/Abd: Soft, nontender, nondistended, bowel sound present,   CNS: Alert, awake, unable to hear his communicates by writing.  Able to comprehend and verbalize Psychiatry: Mood appropriate Extremities: No pedal edema, no calf tenderness,    Condition at discharge: stable  The results of significant diagnostics from this hospitalization (including imaging, microbiology, ancillary and laboratory) are listed below for reference.   Imaging Studies: ECHOCARDIOGRAM COMPLETE Result Date: 10/26/2024    ECHOCARDIOGRAM REPORT   Patient Name:   NANCYLEE GAINES Date of Exam: 10/26/2024 Medical Rec #:  [id number]         Height:       67.0 in Accession #:    [id number]        Weight:       373.0 lb Date of Birth:  19-Nov-1981         BSA:          2.636 m Patient Age:    43 years          BP:           133/73 mmHg Patient Gender: F                 HR:           85 bpm. Exam Location:  Inpatient Procedure: 2D Echo, Cardiac Doppler, Color Doppler  and Intracardiac            Opacification Agent (Both Spectral and Color Flow Doppler were            utilized during procedure). Indications:    Abnormal ECG 794.31 / R94.31  History:        Patient has prior history of Echocardiogram examinations, most                 recent 08/15/2022. Risk Factors:Hypertension, Diabetes and Sleep                 Apnea.  Sonographer:    Jayson Gaskins Referring Phys: BINAYA DAHAL IMPRESSIONS  1. Left ventricular ejection fraction, by estimation, is 65 to 70%. The left ventricle has normal function. The left ventricle has no regional wall motion abnormalities. Left ventricular diastolic function could not be evaluated.  2. Right ventricular systolic function was not well visualized. The right ventricular size is not well visualized.  3. The mitral valve is grossly normal. No evidence of mitral valve regurgitation.  4. The aortic valve was not well visualized. Aortic valve regurgitation is not visualized. Conclusion(s)/Recommendation(s): Technically very limited study due to very poor acoustic windows most structures not well seen. LV and RV appear grossly normal. FINDINGS  Left Ventricle: Left ventricular ejection fraction, by estimation, is 65 to 70%. The left ventricle has normal function. The left ventricle has no regional wall motion abnormalities. The left ventricular internal cavity size was normal in size. There is  no left ventricular hypertrophy. Left ventricular diastolic function could not be evaluated. Right Ventricle: The right ventricular size is not well visualized. Right vetricular wall thickness was not well visualized. Right ventricular systolic function was not well visualized. Left Atrium: Left atrial size was not well visualized. Right Atrium: Right atrial size was not well visualized. Pericardium: There is no evidence of pericardial effusion. Mitral Valve: The mitral valve is grossly normal. No evidence of mitral valve regurgitation. Tricuspid Valve: The  tricuspid valve is grossly normal. Tricuspid valve regurgitation is trivial. Aortic Valve: The aortic valve was not well visualized. Aortic valve regurgitation is not visualized. Pulmonic Valve: The pulmonic valve was not well visualized. Pulmonic valve regurgitation is not visualized. Aorta: The aortic root was not well visualized. Venous: The inferior vena cava was not well visualized. IAS/Shunts: The interatrial septum was not well visualized.  LEFT VENTRICLE PLAX 2D LVIDd:         4.30 cm LVIDs:         2.80 cm LV PW:         1.00 cm LV IVS:        1.00 cm LVOT diam:     1.80 cm LVOT Area:     2.54 cm   SHUNTS Systemic Diam: 1.80 cm Toribio Fuel MD Electronically signed by Toribio Fuel MD Signature Date/Time: 10/26/2024/11:36:26 PM    Final    DG FL GUIDED LUMBAR PUNCTURE Result Date: 10/26/2024 CLINICAL DATA:  Patient with headache and hearing loss with request for image guided lumbar puncture (BMI 58) while inpatient. MRI and CT head available for review prior to procedure without acute abnormality. EXAM: LUMBAR PUNCTURE UNDER FLUOROSCOPY PROCEDURE: An appropriate skin entry site was determined fluoroscopically. Operator donned sterile gloves and mask. Skin site was marked, then prepped with Betadine , draped in usual sterile fashion, and infiltrated locally with 1% lidocaine . A 22 gauge, 7 inch spinal needle advanced into the thecal sac at L5-S1 from a right interlaminar approach. Clear colorless CSF spontaneously returned, with opening pressure of 30 cm water  measure in prone position as fluoroscopy table could not accommodate safely rolling patient body habitus. 21 ml CSF were collected and divided among 4 sterile vials for the requested laboratory studies. Closing pressure 23.5 cm  water . The needle was then removed. The patient tolerated the procedure well and there were no complications. FLUOROSCOPY: Radiation Exposure Index (as provided by the fluoroscopic device): 132.8 mGy Kerma IMPRESSION:  Technically successful lumbar puncture under fluoroscopy. This exam was performed by Brittany Huneycutt, NP, and was supervised and interpreted by Dr. Ree Molt. Electronically Signed   By: Ree Molt M.D.   On: 10/26/2024 13:12   MR BRAIN W CONTRAST Result Date: 10/25/2024 EXAM: MR Brain with Intravenous Contrast. CLINICAL HISTORY: Hearing loss, headache, encephalitis evaluation. TECHNIQUE: Magnetic resonance images of the brain with intravenous contrast in multiple planes. CONTRAST: With; 10mL (gadobutrol (GADAVIST) 1 MMOL/ML injection 10 mL GADOBUTROL 1 MMOL/ML IV SOLN). COMPARISON: Same day MRI head and CT head FINDINGS: Only IAC and postcontrast imaging performed to further evaluate after MRI head done earlier today. Normal enhancement. On motion-limited evaluation, no obvious mass along the course of the 7th and 8th cranial nerves. Normal csf signal within the labyrinth bilaterally. No mass or effusions. IMPRESSION: 1. No evidence of acute abnormality or abnormal enhancement. Electronically signed by: Gilmore Molt MD 10/25/2024 09:45 PM EST RP Workstation: [id number]   MR BRAIN WO CONTRAST Result Date: 10/25/2024 EXAM: MRI BRAIN WITHOUT CONTRAST 10/25/2024 07:25:57 PM TECHNIQUE: Multiplanar multisequence MRI of the head/brain was performed without the administration of intravenous contrast. COMPARISON: CT head without contrast 10/25/2024. CLINICAL HISTORY: Headache, fever; sensory hearing loss bilaterally, headache, left sided paresthesia. FINDINGS: BRAIN AND VENTRICLES: No acute infarct. No intracranial hemorrhage. No mass. No midline shift. No hydrocephalus. The sella is unremarkable. Normal flow voids. ORBITS: No acute abnormality. SINUSES AND MASTOIDS: No acute abnormality. BONES AND SOFT TISSUES: Normal marrow signal. No acute soft tissue abnormality. IMPRESSION: 1. No acute intracranial abnormality. Electronically signed by: Lonni Necessary MD 10/25/2024 07:34 PM EST RP Workstation:  [id number]   CT Head Wo Contrast Result Date: 10/25/2024 EXAM: CT HEAD WITHOUT CONTRAST 10/25/2024 03:21:40 PM TECHNIQUE: CT of the head was performed without the administration of intravenous contrast. Automated exposure control, iterative reconstruction, and/or weight based adjustment of the mA/kV was utilized to reduce the radiation dose to as low as reasonably achievable. COMPARISON: None available. CLINICAL HISTORY: Headache, neuro deficit, left sided numbness. FINDINGS: BRAIN AND VENTRICLES: There is no evidence of an acute infarct, intracranial hemorrhage, mass, midline shift, hydrocephalus, or extra-axial fluid collection. Cerebral volume is normal. ORBITS: Bilateral cataract extraction. No acute abnormality. SINUSES: No acute abnormality. SOFT TISSUES AND SKULL: No acute soft tissue abnormality. No skull fracture. IMPRESSION: 1. Negative head CT. Electronically signed by: Dasie Hamburg MD 10/25/2024 03:28 PM EST RP Workstation: [id number]   DG Chest 2 View Result Date: 10/22/2024 CLINICAL DATA:  Two day history of chest pain EXAM: CHEST - 2 VIEW COMPARISON:  Chest radiograph dated 06/12/2024 FINDINGS: Under penetrated examination. Normal lung volumes. No focal consolidations. No pleural effusion or pneumothorax. The heart size and mediastinal contours are within normal limits. No acute osseous abnormality. IMPRESSION: No active cardiopulmonary disease. Electronically Signed   By: Limin  Xu M.D.   On: 10/22/2024 16:51    Microbiology: Results for orders placed or performed during the hospital encounter of 10/25/24  Resp panel by RT-PCR (RSV, Flu A&B, Covid) Anterior Nasal Swab     Status: None   Collection Time: 10/25/24  2:48 PM   Specimen: Anterior Nasal Swab  Result Value Ref Range Status   SARS Coronavirus 2 by RT PCR NEGATIVE NEGATIVE Final   Influenza A by PCR NEGATIVE NEGATIVE Final   Influenza B by  PCR NEGATIVE NEGATIVE Final    Comment: (NOTE) The Xpert Xpress SARS-CoV-2/FLU/RSV  plus assay is intended as an aid in the diagnosis of influenza from Nasopharyngeal swab specimens and should not be used as a sole basis for treatment. Nasal washings and aspirates are unacceptable for Xpert Xpress SARS-CoV-2/FLU/RSV testing.  Fact Sheet for Patients: bloggercourse.com  Fact Sheet for Healthcare Providers: seriousbroker.it  This test is not yet approved or cleared by the [place]  FDA and has been authorized for detection and/or diagnosis of SARS-CoV-2 by FDA under an Emergency Use Authorization (EUA). This EUA will remain in effect (meaning this test can be used) for the duration of the COVID-19 declaration under Section 564(b)(1) of the Act, 21 U.S.C. section 360bbb-3(b)(1), unless the authorization is terminated or revoked.     Resp Syncytial Virus by PCR NEGATIVE NEGATIVE Final    Comment: (NOTE) Fact Sheet for Patients: bloggercourse.com  Fact Sheet for Healthcare Providers: seriousbroker.it  This test is not yet approved or cleared by the [place]  FDA and has been authorized for detection and/or diagnosis of SARS-CoV-2 by FDA under an Emergency Use Authorization (EUA). This EUA will remain in effect (meaning this test can be used) for the duration of the COVID-19 declaration under Section 564(b)(1) of the Act, 21 U.S.C. section 360bbb-3(b)(1), unless the authorization is terminated or revoked.  Performed at [facility], [address] [place], [place] [address]   Culture, blood (single)     Status: Abnormal   Collection Time: 10/25/24  4:27 PM   Specimen: BLOOD  Result Value Ref Range Status   Specimen Description BLOOD SITE NOT SPECIFIED  Final   Special Requests   Final    BOTTLES DRAWN AEROBIC AND ANAEROBIC Blood Culture results may not be optimal due to an inadequate volume of blood received in culture bottles   Culture  Setup  Time   Final    GRAM POSITIVE COCCI IN BOTH AEROBIC AND ANAEROBIC BOTTLES CRITICAL RESULT CALLED TO, READ BACK BY AND VERIFIED WITH: PHARMD ESABRA SLADE [id number] @ 1841 FH    Culture (A)  Final    STAPHYLOCOCCUS EPIDERMIDIS THE SIGNIFICANCE OF ISOLATING THIS ORGANISM FROM A SINGLE SET OF BLOOD CULTURES WHEN MULTIPLE SETS ARE DRAWN IS UNCERTAIN. PLEASE NOTIFY THE MICROBIOLOGY DEPARTMENT WITHIN ONE WEEK IF SPECIATION AND SENSITIVITIES ARE REQUIRED. Performed at [facility], [address], [place], [place] [address]    Report Status 10/28/2024 FINAL  Final  Blood Culture ID Panel (Reflexed)     Status: Abnormal   Collection Time: 10/25/24  4:27 PM  Result Value Ref Range Status   Enterococcus faecalis NOT DETECTED NOT DETECTED Final   Enterococcus Faecium NOT DETECTED NOT DETECTED Final   Listeria monocytogenes NOT DETECTED NOT DETECTED Final   Staphylococcus species DETECTED (A) NOT DETECTED Final    Comment: CRITICAL RESULT CALLED TO, READ BACK BY AND VERIFIED WITH: PHARMD ESABRA SLADE [id number] @ 1841 FH    Staphylococcus aureus (BCID) NOT DETECTED NOT DETECTED Final   Staphylococcus epidermidis DETECTED (A) NOT DETECTED Final    Comment: Methicillin (oxacillin) resistant coagulase negative staphylococcus. Possible blood culture contaminant (unless isolated from more than one blood culture draw or clinical case suggests pathogenicity). No antibiotic treatment is indicated for blood  culture contaminants. CRITICAL RESULT CALLED TO, READ BACK BY AND VERIFIED WITH: PHARMD ESABRA SLADE [id number] @ 1841 FH    Staphylococcus lugdunensis NOT DETECTED NOT DETECTED Final   Streptococcus species NOT DETECTED NOT DETECTED Final  Streptococcus agalactiae NOT DETECTED NOT DETECTED Final   Streptococcus pneumoniae NOT DETECTED NOT DETECTED Final   Streptococcus pyogenes NOT DETECTED NOT DETECTED Final   A.calcoaceticus-baumannii NOT DETECTED NOT DETECTED Final   Bacteroides fragilis NOT  DETECTED NOT DETECTED Final   Enterobacterales NOT DETECTED NOT DETECTED Final   Enterobacter cloacae complex NOT DETECTED NOT DETECTED Final   Escherichia coli NOT DETECTED NOT DETECTED Final   Klebsiella aerogenes NOT DETECTED NOT DETECTED Final   Klebsiella oxytoca NOT DETECTED NOT DETECTED Final   Klebsiella pneumoniae NOT DETECTED NOT DETECTED Final   Proteus species NOT DETECTED NOT DETECTED Final   Salmonella species NOT DETECTED NOT DETECTED Final   Serratia marcescens NOT DETECTED NOT DETECTED Final   Haemophilus influenzae NOT DETECTED NOT DETECTED Final   Neisseria meningitidis NOT DETECTED NOT DETECTED Final   Pseudomonas aeruginosa NOT DETECTED NOT DETECTED Final   Stenotrophomonas maltophilia NOT DETECTED NOT DETECTED Final   Candida albicans NOT DETECTED NOT DETECTED Final   Candida auris NOT DETECTED NOT DETECTED Final   Candida glabrata NOT DETECTED NOT DETECTED Final   Candida krusei NOT DETECTED NOT DETECTED Final   Candida parapsilosis NOT DETECTED NOT DETECTED Final   Candida tropicalis NOT DETECTED NOT DETECTED Final   Cryptococcus neoformans/gattii NOT DETECTED NOT DETECTED Final   Methicillin resistance mecA/C DETECTED (A) NOT DETECTED Final    Comment: CRITICAL RESULT CALLED TO, READ BACK BY AND VERIFIED WITH: MAYA CHARLENA SLADE [id number] @ [phone] FH Performed at [facility], [address] [place], [place] [address]   CSF culture w Gram Stain     Status: None   Collection Time: 10/25/24  7:01 PM   Specimen: CSF; Cerebrospinal Fluid  Result Value Ref Range Status   Specimen Description CSF  Final   Special Requests Normal  Final   Gram Stain   Final    WBC PRESENT, PREDOMINANTLY MONONUCLEAR NO ORGANISMS SEEN CYTOSPIN SMEAR    Culture   Final    NO GROWTH 3 DAYS Performed at [facility], [address] [place], [place] [address]    Report Status 10/29/2024 FINAL  Final    Labs: CBC: Recent Labs  Lab 10/25/24 1629 10/25/24 2356  10/26/24 0551 10/27/24 0410 10/28/24 1040  WBC 9.8 9.2 7.0 11.2* 9.2  NEUTROABS 6.4  --   --  9.2*  --   HGB 10.6* 10.2* 10.3* 9.1* 9.3*  HCT 35.9* 32.8* 33.5* 29.2* 30.4*  MCV 85.5 83.0 84.2 82.7 82.4  PLT 282 291 248 298 252   Basic Metabolic Panel: Recent Labs  Lab 10/26/24 0551 10/26/24 1211 10/27/24 0410 10/28/24 1040 10/29/24 0626  NA 133* 133* 131* 137 139  K 5.7* 5.2* 5.0 4.6 4.1  CL 94* 96* 95* 98 99  CO2 26 25 23 24 24   GLUCOSE 369* 479* 334* 128* 64*  BUN 27* 27* 31* 25* 23*  CREATININE 1.72* 1.66* 1.30* 1.10* 1.04*  CALCIUM  8.3* 8.5* 8.3* 8.9 8.9   Liver Function Tests: Recent Labs  Lab 10/25/24 1629 10/28/24 1040 10/29/24 0626  AST 18 28 38  ALT 18 16 20   ALKPHOS 115 69 67  BILITOT 0.6 0.7 0.4  PROT 8.2* 7.0 6.3*  ALBUMIN 3.1* 2.9* 2.7*   CBG: Recent Labs  Lab 10/29/24 1719 10/29/24 2037 10/30/24 0012 10/30/24 0459 10/30/24 0839  GLUCAP 259* 267* 271* [phone]* [phone]*    Discharge time spent: greater than 30 minutes.  Signed: Landon FORBES Baller, MD [facility] 10/30/2024

## 2024-10-30 NOTE — Procedures (Signed)
 Audiology picked up PSAP amplifier. Lindsay [name] appears to be hearing well. She was able to understand audiologist easily without PSAP use.  Still recommend audiology referral to [facility] for baseline audiogram in case episode of sudden loss occurs again. Lindsay [name] agreed with recommendation.  Olusegun Gerstenberger Stalnaker AuD Audiologist

## 2024-10-31 ENCOUNTER — Telehealth: Payer: Self-pay | Admitting: *Deleted

## 2024-10-31 LAB — OLIGOCLONAL BANDS, CSF + SERM

## 2024-10-31 NOTE — Transitions of Care (Post Inpatient/ED Visit) (Signed)
   10/31/2024  Name: Lindsay [name] MRN: [id number] DOB: November 04, 1981  Today's TOC FU Call Status: Today's TOC FU Call Status:: Unsuccessful Call (1st Attempt) Unsuccessful Call (1st Attempt) Date: 10/31/24  Attempted to reach the patient regarding the most recent Inpatient/ED visit.  Follow Up Plan: Additional outreach attempts will be made to reach the patient to complete the Transitions of Care (Post Inpatient/ED visit) call.   Andrea Dimes RN, BSN [place]  [facility] Health RN Care Manager [phone]

## 2024-11-01 ENCOUNTER — Telehealth: Payer: Self-pay | Admitting: *Deleted

## 2024-11-01 NOTE — Transitions of Care (Post Inpatient/ED Visit) (Signed)
   11/01/2024  Name: Lindsay [name] MRN: [id number] DOB: Jan 15, 1981  Today's TOC FU Call Status: Today's TOC FU Call Status:: Unsuccessful Call (2nd Attempt) Unsuccessful Call (2nd Attempt) Date: 11/01/24  Attempted to reach the patient regarding the most recent Inpatient/ED visit.  Follow Up Plan: Additional outreach attempts will be made to reach the patient to complete the Transitions of Care (Post Inpatient/ED visit) call.   Andrea Dimes RN, BSN [place]  [facility] Health RN Care Manager [phone]

## 2024-11-02 ENCOUNTER — Telehealth: Payer: Self-pay | Admitting: *Deleted

## 2024-11-02 NOTE — Transitions of Care (Post Inpatient/ED Visit) (Signed)
   11/02/2024  Name: Lindsay [name] MRN: [id number] DOB: 06-Feb-1981  Today's TOC FU Call Status: Today's TOC FU Call Status:: Unsuccessful Call (3rd Attempt) Unsuccessful Call (3rd Attempt) Date: 11/02/24  Attempted to reach the patient regarding the most recent Inpatient/ED visit.  Follow Up Plan: No further outreach attempts will be made at this time. We have been unable to contact the patient.  Andrea Dimes RN, BSN [place]  [facility] Health RN Care Manager [phone]

## 2024-11-10 DIAGNOSIS — E66813 Obesity, class 3: Secondary | ICD-10-CM | POA: Diagnosis not present

## 2024-11-10 DIAGNOSIS — M25511 Pain in right shoulder: Secondary | ICD-10-CM | POA: Diagnosis not present

## 2024-11-10 DIAGNOSIS — G4733 Obstructive sleep apnea (adult) (pediatric): Secondary | ICD-10-CM | POA: Diagnosis not present

## 2024-11-10 DIAGNOSIS — I10 Essential (primary) hypertension: Secondary | ICD-10-CM | POA: Diagnosis not present

## 2024-11-10 DIAGNOSIS — M109 Gout, unspecified: Secondary | ICD-10-CM | POA: Diagnosis not present

## 2024-11-10 DIAGNOSIS — G5601 Carpal tunnel syndrome, right upper limb: Secondary | ICD-10-CM | POA: Diagnosis not present

## 2024-11-10 DIAGNOSIS — M546 Pain in thoracic spine: Secondary | ICD-10-CM | POA: Diagnosis not present

## 2024-11-10 DIAGNOSIS — K2970 Gastritis, unspecified, without bleeding: Secondary | ICD-10-CM | POA: Diagnosis not present

## 2024-11-10 DIAGNOSIS — Z6841 Body Mass Index (BMI) 40.0 and over, adult: Secondary | ICD-10-CM | POA: Diagnosis not present

## 2024-11-10 DIAGNOSIS — E119 Type 2 diabetes mellitus without complications: Secondary | ICD-10-CM | POA: Diagnosis not present

## 2024-11-10 DIAGNOSIS — M47816 Spondylosis without myelopathy or radiculopathy, lumbar region: Secondary | ICD-10-CM | POA: Diagnosis not present

## 2024-11-12 ENCOUNTER — Other Ambulatory Visit: Payer: Self-pay

## 2024-11-12 ENCOUNTER — Emergency Department (HOSPITAL_COMMUNITY)
Admission: EM | Admit: 2024-11-12 | Discharge: 2024-11-12 | Disposition: A | Discharge status: Home / Self Care | Attending: Emergency Medicine | Admitting: Emergency Medicine

## 2024-11-12 ENCOUNTER — Encounter (HOSPITAL_COMMUNITY): Payer: Self-pay | Admitting: *Deleted

## 2024-11-12 DIAGNOSIS — Z794 Long term (current) use of insulin: Secondary | ICD-10-CM | POA: Diagnosis not present

## 2024-11-12 DIAGNOSIS — I2510 Atherosclerotic heart disease of native coronary artery without angina pectoris: Secondary | ICD-10-CM | POA: Diagnosis not present

## 2024-11-12 DIAGNOSIS — T162XXA Foreign body in left ear, initial encounter: Secondary | ICD-10-CM | POA: Diagnosis not present

## 2024-11-12 DIAGNOSIS — Z7982 Long term (current) use of aspirin: Secondary | ICD-10-CM | POA: Diagnosis not present

## 2024-11-12 DIAGNOSIS — Z79899 Other long term (current) drug therapy: Secondary | ICD-10-CM | POA: Diagnosis not present

## 2024-11-12 DIAGNOSIS — I509 Heart failure, unspecified: Secondary | ICD-10-CM | POA: Insufficient documentation

## 2024-11-12 DIAGNOSIS — W44F4XA Insect entering into or through a natural orifice, initial encounter: Secondary | ICD-10-CM | POA: Diagnosis not present

## 2024-11-12 DIAGNOSIS — E119 Type 2 diabetes mellitus without complications: Secondary | ICD-10-CM | POA: Diagnosis not present

## 2024-11-12 DIAGNOSIS — I110 Hypertensive heart disease with heart failure: Secondary | ICD-10-CM | POA: Insufficient documentation

## 2024-11-12 MED ORDER — CIPROFLOXACIN-DEXAMETHASONE 0.3-0.1 % OT SUSP
4.0000 [drp] | Freq: Once | OTIC | Status: AC
  Administered 2024-11-12: 4 [drp] via OTIC
  Filled 2024-11-12: qty 7.5

## 2024-11-12 MED ORDER — CIPROFLOXACIN-DEXAMETHASONE 0.3-0.1 % OT SUSP: 4.0000 [drp] | Freq: Two times a day (BID) | OTIC | 0 refills | Status: AC

## 2024-11-12 NOTE — ED Provider Triage Note (Signed)
 Emergency Medicine Provider Triage Evaluation Note  Lindsay [name] , a 43 y.o. female  was evaluated in triage.  Pt complains of fb in ear. Today pt felt a bug near her L ear, tried to swat at it and it crawled into her L ear.  She did put vegeteble oil in her ear and came here.  Report L ear irritation  Review of Systems  Positive: As above Negative: As above  Physical Exam  BP (!) 154/86   Pulse 98   Temp 98 F (36.7 C)   Resp 18   Wt (!) 178.3 kg   LMP 07/28/2024 (Exact Date)   SpO2 97%   BMI 61.55 kg/m  Gen:   Awake, no distress   Resp:  Normal effort  MSK:   Moves extremities without difficulty  Other:  Roach in L ear canal  Medical Decision Making  Medically screening exam initiated at 12:18 PM.  Appropriate orders placed.  Sujata D Sun was informed that the remainder of the evaluation will be completed by another provider, this initial triage assessment does not replace that evaluation, and the importance of remaining in the ED until their evaluation is complete.  merle Nivia Colon, PA-C 11/12/24 1219

## 2024-11-12 NOTE — ED Provider Notes (Cosign Needed Addendum)
 [place] EMERGENCY DEPARTMENT AT [facility] Provider Note   CSN: [id number] Arrival date & time: 11/12/24  1138     Patient presents with: Foreign Body in Ear   Lindsay [name] is a 43 y.o. female who presents to the emergency department with a chief complaint of bug in her left ear.  Patient states that she was at home earlier today and the bug fell off of something and landed on her left shoulder.  She said in the process of her trying to get off of her shoulder it crawled into her left ear.  Patient states that she poured vegetable oil in her left ear to try and kill the bug.  Patient states that she believes that the bug is dead because it has not moved in several hours.  Patient states that the bug went in her ear at approximately 9 AM this morning.  She does appreciate some left ear pain but is able to hear consistent with her baseline.  Denies fever, chills.  Patient does have some hearing loss at baseline.  Past medical history significant for back pain, hypertension, septic arthritis, type 2 diabetes, coronary artery disease, hyperlipidemia, heart failure, etc.    Foreign Body in Ear       Prior to Admission medications   Medication Sig Start Date End Date Taking? Authorizing Provider  ciprofloxacin -dexamethasone  (CIPRODEX ) OTIC suspension Place 4 drops into the left ear 2 (two) times daily for 7 days. 11/12/24 11/19/24 Yes Ilaisaane Marts F, PA-C  acetaminophen  (TYLENOL ) 500 MG tablet Take 1,000 mg by mouth in the morning and at bedtime.    [provider]  albuterol  (VENTOLIN  HFA) 108 (90 Base) MCG/ACT inhaler Inhale 2 puffs into the lungs every 6 (six) hours as needed for wheezing or shortness of breath. 03/15/24   Celestia Rosaline SQUIBB, NP  allopurinol (ZYLOPRIM) 100 MG tablet Take 100 mg by mouth daily. Patient not taking: Reported on 08/03/2024    [provider]  aspirin  81 MG tablet Take 1 tablet (81 mg total) by mouth daily. Patient not  taking: Reported on 10/25/2024 01/13/18   Kristy Sharolyn Lenis, PA-C  atorvastatin  (LIPITOR ) 20 MG tablet Take 20 mg by mouth daily.    [provider]  colchicine 0.6 MG tablet Take 0.6 mg by mouth daily as needed (Gout). Patient not taking: Reported on 10/25/2024 03/20/24   [provider]  DULoxetine  (CYMBALTA ) 30 MG capsule Take 1 capsule (30 mg total) by mouth 2 (two) times daily. 05/03/24   Celestia Rosaline SQUIBB, NP  [place]-Vitamin C-Zinc [facility] IMMUNE HEALTH GUMMY PO) Take 2 tablets by mouth daily.    [provider]  famotidine  (PEPCID ) 20 MG tablet Take 1 tablet (20 mg total) by mouth 2 (two) times daily. Patient taking differently: Take 20 mg by mouth daily as needed for heartburn. 06/13/24   Desiderio Chew, PA-C  gabapentin  (NEURONTIN ) 300 MG capsule Take 2 capsules (600 mg total) by mouth 3 (three) times daily. Patient taking differently: Take 600 mg by mouth 2 (two) times daily. 09/01/24   Celestia Rosaline SQUIBB, NP  HUMULIN  R U-500 KWIKPEN 500 UNIT/ML KwikPen Inject 75-85 Units into the skin in the morning, at noon, and at bedtime. Inject 85 units into the skin in the morning, 85 units in the afternoon and 75-80 units at night 10/17/24   [provider]  hydrALAZINE  (APRESOLINE ) 100 MG tablet TAKE 1 TABLET(100 MG) BY MOUTH THREE TIMES DAILY 10/11/24   Celestia Rosaline SQUIBB,  NP  isosorbide  mononitrate (IMDUR ) 60 MG 24 hr tablet Take 1 tablet (60 mg total) by mouth daily. Patient not taking: Reported on 10/25/2024 03/09/23   Santo Stanly LABOR, MD  LANTUS  SOLOSTAR 100 UNIT/ML Solostar Pen SMARTSIG:25 Unit(s) SUB-Q Every Morning 09/18/24   [provider]  losartan  (COZAAR ) 100 MG tablet TAKE 1 TABLET(100 MG) BY MOUTH DAILY 05/19/24   Chandrasekhar, Mahesh A, MD  MOUNJARO 5 MG/0.5ML Pen Inject 5 mg into the skin once a week. 12/20/23   [provider]  nitroGLYCERIN  (NITROSTAT ) 0.4 MG SL tablet Place 1 tablet (0.4 mg total) under the tongue  every 5 (five) minutes as needed for chest pain. Please keep upcoming appt in October 2022. Final Attempt 05/03/24   Celestia Rosaline SQUIBB, NP  ondansetron  (ZOFRAN ) 4 MG tablet Take 1 tablet (4 mg total) by mouth every 6 (six) hours. 10/22/24   Randol Simmonds, MD  oxyCODONE -acetaminophen  (PERCOCET) 10-325 MG tablet Take 1 tablet by mouth 3 (three) times daily as needed. 10/13/24   [provider]  Phenyleph-Doxylamine-DM-APAP (NYQUIL SEVERE COLD/FLU) 5-6.25-10-325 MG/15ML LIQD Take 30 mLs by mouth See admin instructions. Take 30ml by mouth every 6 to 8 hours as needed for cold/flu symptoms    [provider]  tiZANidine  (ZANAFLEX ) 4 MG tablet Take 1 tablet (4 mg total) by mouth every 6 (six) hours as needed for muscle spasms. 09/08/24   Celestia Rosaline SQUIBB, NP  torsemide  (DEMADEX ) 20 MG tablet Take 2 tablets (40 mg total) by mouth 2 (two) times daily. 03/15/24   Celestia Rosaline SQUIBB, NP    Allergies: Hydrazine yellow [fd&c yellow #5 (tartrazine)], Lisinopril , Pineapple, and Pineapple extract    Review of Systems  HENT:         Foreign body in ear    Updated Vital Signs BP (!) 172/98   Pulse 98   Temp 98.3 F (36.8 C) (Oral)   Resp 18   Wt (!) 178.3 kg   LMP 07/28/2024 (Exact Date)   SpO2 98%   BMI 61.55 kg/m   Physical Exam Vitals and nursing note reviewed.  Constitutional:      General: She is awake. She is not in acute distress.    Appearance: Normal appearance. She is not ill-appearing, toxic-appearing or diaphoretic.  HENT:     Head: Normocephalic and atraumatic.     Right Ear: Tympanic membrane, ear canal and external ear normal. There is no impacted cerumen.     Left Ear: There is no impacted cerumen.     Ears:     Comments: Dead bug noted near L ear drum, ear drum does not appear perforated, mild irritation of external ear drum and ear canal noted Eyes:     General: No scleral icterus. Pulmonary:     Effort: Pulmonary effort is normal. No respiratory distress.   Musculoskeletal:     Cervical back: Normal range of motion.  Skin:    General: Skin is warm.     Capillary Refill: Capillary refill takes less than 2 seconds.  Neurological:     General: No focal deficit present.     Mental Status: She is alert and oriented to person, place, and time.  Psychiatric:        Mood and Affect: Mood normal.        Behavior: Behavior normal. Behavior is cooperative.     (all labs ordered are listed, but only abnormal results are displayed) Labs Reviewed - No data to display  EKG: None  Radiology: No results found.   Procedures   Medications Ordered in the ED  ciprofloxacin -dexamethasone  (CIPRODEX ) 0.3-0.1 % OTIC (EAR) suspension 4 drop (4 drops Left EAR Given 11/12/24 1620)                                    Medical Decision Making Risk Prescription drug management.   Patient presents to the ED for concern of left ear pain, this involves an extensive number of treatment options, and is a complaint that carries with it a high risk of complications and morbidity.  The differential diagnosis includes foreign body, otitis media, otitis externa, perforated eardrum, mastoiditis, etc.   Co morbidities that complicate the patient evaluation  back pain, hypertension, septic arthritis, type 2 diabetes, coronary artery disease, hyperlipidemia, heart failure   Medicines ordered and prescription drug management:  I ordered medication including Ciprodex  drops for foreign body in left ear Reevaluation of the patient after these medicines showed that the patient improved I have reviewed the patients home medicines and have made adjustments as needed    Problem List / ED Course:  43 year old female, vital signs stable, presents emergency department with a chief complaint of foreign body in her left ear. On physical exam small bedbug present near left eardrum, mild irritation of the left eardrum as well as left ear canal however no eardrum  perforation Procedure was performed by Resident Dr. Arlee without complication, bug was removed in its entirety Plan for ear irrigation to ensure no remaining foreign bodies as well as to clear out mild amount of blood from procedure Patient given dose of Ciprodex  drops and given prescription for home as well Return precautions given Patient discharged Most likely diagnosis at this time is foreign body of the left ear which was removed without complication On reassessment after washout, no sign of remaining foreign body, TM visualized and not perforated, no signs of active bleeding   Reevaluation:  After the interventions noted above, I reevaluated the patient and found that they have :improved   Social Determinants of Health:  none   Dispostion:  After consideration of the diagnostic results and the patients response to treatment, I feel that the patient would benefit from discharge and outpatient therapy as described, follow-up with primary care provider.       Final diagnoses:  Foreign body of left ear, initial encounter    ED Discharge Orders          Ordered    ciprofloxacin -dexamethasone  (CIPRODEX ) OTIC suspension  2 times daily        11/12/24 1518               Janetta Terrall FALCON, PA-C 11/12/24 1742    Janetta Terrall FALCON, PA-C 11/12/24 1743    Garrick Charleston, MD 11/13/24 1750

## 2024-11-12 NOTE — ED Provider Notes (Signed)
.  Foreign Body Removal  Date/Time: 11/12/2024 3:14 PM  Performed by: Arlee Katz, MD Authorized by: Garrick Charleston, MD  Consent: Verbal consent obtained Risks and benefits: risks, benefits and alternatives were discussed Consent given by: patient Patient understanding: patient states understanding of the procedure being performed Patient consent: the patient's understanding of the procedure matches consent given Procedure consent: procedure consent matches procedure scheduled Relevant documents: relevant documents present and verified Test results: test results available and properly labeled Required items: required blood products, implants, devices, and special equipment available Patient identity confirmed: verbally with patient Body area: ear Location details: left ear Patient cooperative: yes Localization method: visualized Removal mechanism: alligator forceps Complexity: simple 1 objects recovered. Objects recovered: 1x cockroach, dead, removed from L ear Post-procedure assessment: foreign body removed Patient tolerance: patient tolerated the procedure well with no immediate complications Comments: Cockroach was already dead, patient placed vegetable oil in the left ear prior to presenting to the ED.  Complete body of cockroach was removed.  No acute complications.  Skin amount of bleeding present afterwards, will flush out ear, provide Ciprodex  drops.      Arlee Katz, MD 11/12/24 1517    Garrick Charleston, MD 11/12/24 [phone]

## 2024-11-12 NOTE — Discharge Instructions (Addendum)
 It was a pleasure taking care of you today.  Based on your history and physical exam I feel you are safe for discharge.  Today the bug in your left ear was removed in the emergency department.  There was a small amount of bleeding after which was controlled.  You have been prescribed some antibiotic drops, please use for 7 days as prescribed to make sure you do not get a bacterial infection from the bug being in your ear.  Please continue to monitor your symptoms, if you develop issues hearing, severe ear pain, ear drainage, swelling, fever, chills, or other concerning symptom please return the emergency department or seek further medical care.  If symptoms worsen recommend follow-up within 48 hours.

## 2024-11-12 NOTE — ED Triage Notes (Signed)
 Patient thinks she has a insect in her left ear. She states she feeling it crawling and fluttering. She reports that she has been feeling it since 10am. Reports slight ear pain.

## 2024-11-12 NOTE — ED Triage Notes (Signed)
 BIB POV from home for insect in L ear, first noticed this am after waking. Endorses pain fluttering, irritation. Denies dizziness, NV, fever. Verbalizes tried suffocating with oil. Mentions distant h/o ear issues and ear f/b (popcorn kernel).  Alert, NAD, calm, interactive, resps e/u, speaking in clear complete sentences, skin W&D, steady gait.

## 2024-11-13 ENCOUNTER — Other Ambulatory Visit (INDEPENDENT_AMBULATORY_CARE_PROVIDER_SITE_OTHER): Payer: Self-pay | Admitting: Primary Care

## 2024-11-13 DIAGNOSIS — M47816 Spondylosis without myelopathy or radiculopathy, lumbar region: Secondary | ICD-10-CM | POA: Diagnosis not present

## 2024-11-13 DIAGNOSIS — M62838 Other muscle spasm: Secondary | ICD-10-CM

## 2024-11-13 DIAGNOSIS — Z6841 Body Mass Index (BMI) 40.0 and over, adult: Secondary | ICD-10-CM | POA: Diagnosis not present

## 2024-11-14 NOTE — Telephone Encounter (Signed)
 Requested medications are due for refill today.  yes  Requested medications are on the active medications list.  yes  Last refill. 09/08/2024 #90 1 rf  Future visit scheduled.   no  Notes to clinic.  Refill not delegated    Requested Prescriptions  Pending Prescriptions Disp Refills   tiZANidine  (ZANAFLEX ) 4 MG tablet [Pharmacy Med Name: TIZANIDINE  4MG  TABLETS] 90 tablet 1    Sig: TAKE 1 TABLET(4 MG) BY MOUTH EVERY 6 HOURS AS NEEDED FOR MUSCLE SPASMS     Not Delegated - Cardiovascular:  Alpha-2 Agonists - tizanidine  Failed - 11/14/2024 11:19 AM      Failed - This refill cannot be delegated      Passed - Valid encounter within last 6 months    Recent Outpatient Visits           3 months ago Uncontrolled type 2 diabetes mellitus with hyperglycemia, with long-term current use of insulin  (HCC)   [place] [facility] Celestia Rosaline SQUIBB, NP   6 months ago Anxiety and depression   [place] [facility] Celestia Rosaline SQUIBB, NP   8 months ago Uncomplicated asthma, unspecified asthma severity, unspecified whether persistent   [place] [facility] Celestia Rosaline SQUIBB, NP   1 year ago Muscle spasm   [place] [facility] Celestia Rosaline SQUIBB, NP   1 year ago Diabetes mellitus with complication [facility])   [place] [facility] Celestia Rosaline SQUIBB, NP

## 2024-11-15 ENCOUNTER — Other Ambulatory Visit (INDEPENDENT_AMBULATORY_CARE_PROVIDER_SITE_OTHER): Payer: Self-pay | Admitting: Primary Care

## 2024-11-15 DIAGNOSIS — F32A Depression, unspecified: Secondary | ICD-10-CM

## 2024-11-15 DIAGNOSIS — Z760 Encounter for issue of repeat prescription: Secondary | ICD-10-CM

## 2024-11-15 DIAGNOSIS — R200 Anesthesia of skin: Secondary | ICD-10-CM

## 2024-11-15 NOTE — Telephone Encounter (Signed)
 Will forward to provider

## 2024-11-20 ENCOUNTER — Other Ambulatory Visit: Payer: Self-pay | Admitting: Surgery

## 2024-11-20 DIAGNOSIS — M47816 Spondylosis without myelopathy or radiculopathy, lumbar region: Secondary | ICD-10-CM

## 2024-11-21 ENCOUNTER — Encounter: Payer: Self-pay | Admitting: Surgery

## 2024-11-23 DIAGNOSIS — E66813 Obesity, class 3: Secondary | ICD-10-CM | POA: Diagnosis not present

## 2024-11-23 DIAGNOSIS — I10 Essential (primary) hypertension: Secondary | ICD-10-CM | POA: Diagnosis not present

## 2024-11-23 DIAGNOSIS — M47816 Spondylosis without myelopathy or radiculopathy, lumbar region: Secondary | ICD-10-CM | POA: Diagnosis not present

## 2024-11-23 DIAGNOSIS — Z6841 Body Mass Index (BMI) 40.0 and over, adult: Secondary | ICD-10-CM | POA: Diagnosis not present

## 2024-11-23 DIAGNOSIS — M25511 Pain in right shoulder: Secondary | ICD-10-CM | POA: Diagnosis not present

## 2024-11-23 DIAGNOSIS — G4733 Obstructive sleep apnea (adult) (pediatric): Secondary | ICD-10-CM | POA: Diagnosis not present

## 2024-11-23 DIAGNOSIS — K2970 Gastritis, unspecified, without bleeding: Secondary | ICD-10-CM | POA: Diagnosis not present

## 2024-11-23 DIAGNOSIS — G5601 Carpal tunnel syndrome, right upper limb: Secondary | ICD-10-CM | POA: Diagnosis not present

## 2024-11-23 DIAGNOSIS — E119 Type 2 diabetes mellitus without complications: Secondary | ICD-10-CM | POA: Diagnosis not present

## 2024-11-30 LAB — FUNGUS CULTURE WITH STAIN

## 2024-11-30 LAB — FUNGAL ORGANISM REFLEX

## 2024-11-30 LAB — FUNGUS CULTURE RESULT

## 2024-12-01 ENCOUNTER — Other Ambulatory Visit (INDEPENDENT_AMBULATORY_CARE_PROVIDER_SITE_OTHER): Payer: Self-pay

## 2024-12-01 ENCOUNTER — Other Ambulatory Visit (INDEPENDENT_AMBULATORY_CARE_PROVIDER_SITE_OTHER): Payer: Self-pay | Admitting: Primary Care

## 2024-12-01 DIAGNOSIS — E118 Type 2 diabetes mellitus with unspecified complications: Secondary | ICD-10-CM

## 2024-12-01 NOTE — Telephone Encounter (Signed)
 Will forward to provider   Unclarification is pt taking medication 3 or 2 times a day. Pt reported while in the hospital on 10/25/24 she was taking 2 times a day  Please review

## 2024-12-01 NOTE — Telephone Encounter (Signed)
Will forward 

## 2024-12-07 ENCOUNTER — Inpatient Hospital Stay: Admission: RE | Admit: 2024-12-07 | Discharge: 2024-12-07 | Attending: Surgery | Admitting: Surgery

## 2024-12-07 DIAGNOSIS — M47816 Spondylosis without myelopathy or radiculopathy, lumbar region: Secondary | ICD-10-CM

## 2024-12-07 DIAGNOSIS — M5136 Other intervertebral disc degeneration, lumbar region with discogenic back pain only: Secondary | ICD-10-CM | POA: Diagnosis not present

## 2024-12-07 DIAGNOSIS — M5127 Other intervertebral disc displacement, lumbosacral region: Secondary | ICD-10-CM | POA: Diagnosis not present

## 2024-12-07 NOTE — Telephone Encounter (Unsigned)
 Copied from CRM #[id number]. Topic: Clinical - Medication Question >> Dec 07, 2024  4:17 PM Dedra B wrote: Reason for CRM: Pt called to follow up on refill request for gabapentin  (NEURONTIN ) 300 MG capsule sent by pharmacy 12/01/24.

## 2024-12-08 NOTE — Telephone Encounter (Signed)
 Will forward to provider

## 2024-12-11 DIAGNOSIS — G4733 Obstructive sleep apnea (adult) (pediatric): Secondary | ICD-10-CM | POA: Diagnosis not present

## 2024-12-11 DIAGNOSIS — E66813 Obesity, class 3: Secondary | ICD-10-CM | POA: Diagnosis not present

## 2024-12-11 DIAGNOSIS — M25511 Pain in right shoulder: Secondary | ICD-10-CM | POA: Diagnosis not present

## 2024-12-11 DIAGNOSIS — E119 Type 2 diabetes mellitus without complications: Secondary | ICD-10-CM | POA: Diagnosis not present

## 2024-12-11 DIAGNOSIS — M47816 Spondylosis without myelopathy or radiculopathy, lumbar region: Secondary | ICD-10-CM | POA: Diagnosis not present

## 2024-12-11 DIAGNOSIS — G5601 Carpal tunnel syndrome, right upper limb: Secondary | ICD-10-CM | POA: Diagnosis not present

## 2024-12-11 DIAGNOSIS — Z6841 Body Mass Index (BMI) 40.0 and over, adult: Secondary | ICD-10-CM | POA: Diagnosis not present

## 2024-12-11 DIAGNOSIS — I10 Essential (primary) hypertension: Secondary | ICD-10-CM | POA: Diagnosis not present

## 2025-01-11 ENCOUNTER — Other Ambulatory Visit (INDEPENDENT_AMBULATORY_CARE_PROVIDER_SITE_OTHER): Payer: Self-pay | Admitting: Primary Care

## 2025-01-11 DIAGNOSIS — M62838 Other muscle spasm: Secondary | ICD-10-CM

## 2025-01-11 NOTE — Telephone Encounter (Signed)
 Requested medications are due for refill today.  yes  Requested medications are on the active medications list.  yes  Last refill. 09/08/2024 #90 1 rf  Future visit scheduled.   no  Notes to clinic.  Refill not delegated.    Requested Prescriptions  Pending Prescriptions Disp Refills   tiZANidine  (ZANAFLEX ) 4 MG tablet [Pharmacy Med Name: TIZANIDINE  4MG  TABLETS] 90 tablet 1    Sig: TAKE 1 TABLET(4 MG) BY MOUTH EVERY 6 HOURS AS NEEDED FOR MUSCLE SPASMS     Not Delegated - Cardiovascular:  Alpha-2 Agonists - tizanidine  Failed - 01/11/2025  2:59 PM      Failed - This refill cannot be delegated      Passed - Valid encounter within last 6 months    Recent Outpatient Visits           5 months ago Uncontrolled type 2 diabetes mellitus with hyperglycemia, with long-term current use of insulin  (HCC)   [place] [facility] Celestia Rosaline SQUIBB, NP   8 months ago Anxiety and depression   [place] [facility] Celestia Rosaline SQUIBB, NP   10 months ago Uncomplicated asthma, unspecified asthma severity, unspecified whether persistent   [place] [facility] Celestia Rosaline SQUIBB, NP   1 year ago Muscle spasm   [place] [facility] Celestia Rosaline SQUIBB, NP   1 year ago Diabetes mellitus with complication [facility])   [place] [facility] Celestia Rosaline SQUIBB, NP

## 2025-01-12 LAB — ACID FAST CULTURE WITH REFLEXED SENSITIVITIES (MYCOBACTERIA)

## 2025-01-15 NOTE — Telephone Encounter (Signed)
 Will forward to provider

## 2025-01-16 ENCOUNTER — Ambulatory Visit: Admitting: Podiatry

## 2025-01-16 ENCOUNTER — Encounter: Payer: Self-pay | Admitting: Podiatry

## 2025-01-16 DIAGNOSIS — M79674 Pain in right toe(s): Secondary | ICD-10-CM

## 2025-01-16 DIAGNOSIS — B351 Tinea unguium: Secondary | ICD-10-CM

## 2025-01-16 DIAGNOSIS — E1142 Type 2 diabetes mellitus with diabetic polyneuropathy: Secondary | ICD-10-CM | POA: Diagnosis not present

## 2025-01-16 DIAGNOSIS — M79675 Pain in left toe(s): Secondary | ICD-10-CM

## 2025-01-17 NOTE — Progress Notes (Signed)
"  °  Subjective:  Patient ID: Lindsay [name], female    DOB: 06/29/1981,  MRN: [id number]  Lindsay [name] presents to clinic today for at risk foot care with history of diabetic neuropathy and painful mycotic toenails of both feet that are difficult to trim. Pain interferes with daily activities and wearing enclosed shoe gear comfortably.  Chief Complaint  Patient presents with   Diabetes    Cut my toenails.  Saw Lindsay Nigh, NP -  03/15/2024;  A1C - 10.1 10/25/2024     New problem(s): None.   PCP is Lindsay Rosaline SQUIBB, NP.  Allergies[1]  Review of Systems: Negative except as noted in the HPI.  Objective:  There were no vitals filed for this visit. Lindsay [name] is a pleasant 44 y.o. female morbidly obese in NAD. AAO x 3.  Vascular Examination: Capillary refill time immediate b/l. Palpable pedal pulses. Pedal hair absent b/l. No pain with calf compression b/l. Skin temperature gradient WNL b/l. No cyanosis or clubbing b/l. No ischemia or gangrene noted b/l. Trace edema noted BLE.  Neurological Examination: Pt has subjective symptoms of neuropathy. Protective sensation diminished with 10g monofilament b/l.  Dermatological Examination: Pedal skin with normal turgor, texture and tone b/l.  No open wounds. No interdigital macerations.   Toenails 1-5 bilaterally elongated, discolored, dystrophic, thickened, and crumbly with subungual debris and tenderness to dorsal palpation.   No hyperkeratotic nor porokeratotic lesions present on today's visit.  Musculoskeletal Examination: Muscle strength 5/5 to all lower extremity muscle groups bilaterally. Pes planus deformity noted bilateral LE.SABRA No pain, crepitus or joint limitation noted with ROM b/l LE.  Patient ambulates independently without assistive aids.  Radiographs: None  Assessment/Plan: 1. Pain due to onychomycosis of toenails of both feet   2. Diabetic peripheral neuropathy associated with type 2 diabetes mellitus  [facility])   Consent given for treatment. Patient examined. All patient's and/or POA's questions/concerns addressed on today's visit. Mycotic toenails 1-5 b/l debrided in length and girth without incident. Continue foot and shoe inspections daily. Monitor blood glucose per PCP/Endocrinologist's recommendations.Continue soft, supportive shoe gear daily. Report any pedal injuries to medical professional. Call office if there are any quesitons/concerns. -Patient/POA to call should there be question/concern in the interim.   Return in about 3 months (around 04/16/2025).  Lindsay [name], DPM      [place] LOCATION: [address], [place] [address]                   Office [phone]   [place] LOCATION: [address] [place], [place] [address] Office [phone]      [1]  Allergies Allergen Reactions   Hydrazine Yellow [Fd&C Yellow #5 (Tartrazine)] Shortness Of Breath and Swelling    Swelling mostly noticed in legs and feet, retaining urination, shortness of breaht, and minor chest pain   Lisinopril  Shortness Of Breath    Was on prinzide ; had sob/chest pain on it.   Pineapple Itching   Pineapple Extract    "

## 2025-04-24 ENCOUNTER — Ambulatory Visit: Admitting: Podiatry
